# Patient Record
Sex: Male | Born: 1955 | Race: White | Hispanic: No | Marital: Married | Smoking: Never smoker
Health system: Southern US, Community
[De-identification: ages and names within clinical notes are randomized; demographics above are authoritative.]

## PROBLEM LIST (undated history)

## (undated) ENCOUNTER — Emergency Department (HOSPITAL_COMMUNITY): Admission: EM | Payer: Medicare Other | Source: Home / Self Care

## (undated) DIAGNOSIS — E119 Type 2 diabetes mellitus without complications: Secondary | ICD-10-CM

## (undated) DIAGNOSIS — H269 Unspecified cataract: Secondary | ICD-10-CM

## (undated) DIAGNOSIS — Z951 Presence of aortocoronary bypass graft: Secondary | ICD-10-CM

## (undated) DIAGNOSIS — K7460 Unspecified cirrhosis of liver: Secondary | ICD-10-CM

## (undated) DIAGNOSIS — I10 Essential (primary) hypertension: Secondary | ICD-10-CM

## (undated) DIAGNOSIS — G4733 Obstructive sleep apnea (adult) (pediatric): Secondary | ICD-10-CM

## (undated) DIAGNOSIS — M5126 Other intervertebral disc displacement, lumbar region: Secondary | ICD-10-CM

## (undated) DIAGNOSIS — F0390 Unspecified dementia without behavioral disturbance: Secondary | ICD-10-CM

## (undated) DIAGNOSIS — Z8739 Personal history of other diseases of the musculoskeletal system and connective tissue: Secondary | ICD-10-CM

## (undated) DIAGNOSIS — D696 Thrombocytopenia, unspecified: Secondary | ICD-10-CM

## (undated) DIAGNOSIS — Z8719 Personal history of other diseases of the digestive system: Secondary | ICD-10-CM

## (undated) DIAGNOSIS — G43909 Migraine, unspecified, not intractable, without status migrainosus: Secondary | ICD-10-CM

## (undated) DIAGNOSIS — M1990 Unspecified osteoarthritis, unspecified site: Secondary | ICD-10-CM

## (undated) DIAGNOSIS — H9192 Unspecified hearing loss, left ear: Secondary | ICD-10-CM

## (undated) DIAGNOSIS — G8929 Other chronic pain: Secondary | ICD-10-CM

## (undated) DIAGNOSIS — G459 Transient cerebral ischemic attack, unspecified: Secondary | ICD-10-CM

## (undated) DIAGNOSIS — M545 Low back pain, unspecified: Secondary | ICD-10-CM

## (undated) DIAGNOSIS — I252 Old myocardial infarction: Secondary | ICD-10-CM

## (undated) DIAGNOSIS — K760 Fatty (change of) liver, not elsewhere classified: Secondary | ICD-10-CM

## (undated) DIAGNOSIS — F419 Anxiety disorder, unspecified: Secondary | ICD-10-CM

## (undated) DIAGNOSIS — E7800 Pure hypercholesterolemia, unspecified: Secondary | ICD-10-CM

## (undated) DIAGNOSIS — I639 Cerebral infarction, unspecified: Secondary | ICD-10-CM

## (undated) DIAGNOSIS — K219 Gastro-esophageal reflux disease without esophagitis: Secondary | ICD-10-CM

## (undated) DIAGNOSIS — I2510 Atherosclerotic heart disease of native coronary artery without angina pectoris: Secondary | ICD-10-CM

## (undated) DIAGNOSIS — I351 Nonrheumatic aortic (valve) insufficiency: Secondary | ICD-10-CM

## (undated) DIAGNOSIS — Z9989 Dependence on other enabling machines and devices: Secondary | ICD-10-CM

## (undated) HISTORY — PX: CARDIAC CATHETERIZATION: SHX172

## (undated) HISTORY — PX: ESOPHAGOGASTRODUODENOSCOPY: SHX1529

## (undated) HISTORY — DX: Unspecified cirrhosis of liver: K74.60

## (undated) HISTORY — DX: Type 2 diabetes mellitus without complications: E11.9

## (undated) HISTORY — PX: COLONOSCOPY: SHX174

## (undated) HISTORY — DX: Atherosclerotic heart disease of native coronary artery without angina pectoris: I25.10

## (undated) HISTORY — DX: Obstructive sleep apnea (adult) (pediatric): G47.33

## (undated) HISTORY — PX: NEUROPLASTY / TRANSPOSITION ULNAR NERVE AT ELBOW: SUR895

## (undated) HISTORY — DX: Essential (primary) hypertension: I10

## (undated) HISTORY — PX: SHOULDER ARTHROSCOPY: SHX128

## (undated) HISTORY — PX: CORONARY ANGIOPLASTY WITH STENT PLACEMENT: SHX49

## (undated) HISTORY — PX: CORONARY ANGIOPLASTY: SHX604

## (undated) SURGERY — ESOPHAGOGASTRODUODENOSCOPY (EGD) WITH PROPOFOL
Anesthesia: Monitor Anesthesia Care

## (undated) MED FILL — Iron Sucrose Inj 20 MG/ML (Fe Equiv): INTRAVENOUS | Qty: 15 | Status: AC

---
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
## 1996-11-09 HISTORY — PX: ANTERIOR CERVICAL DECOMP/DISCECTOMY FUSION: SHX1161

## 1998-03-13 ENCOUNTER — Ambulatory Visit (HOSPITAL_COMMUNITY): Admission: RE | Admit: 1998-03-13 | Discharge: 1998-03-13 | Payer: Self-pay | Admitting: Internal Medicine

## 1998-04-18 ENCOUNTER — Ambulatory Visit (HOSPITAL_COMMUNITY): Admission: RE | Admit: 1998-04-18 | Discharge: 1998-04-18 | Payer: Self-pay | Admitting: Internal Medicine

## 1999-09-03 ENCOUNTER — Encounter: Payer: Self-pay | Admitting: Family Medicine

## 1999-09-03 ENCOUNTER — Ambulatory Visit (HOSPITAL_COMMUNITY): Admission: RE | Admit: 1999-09-03 | Discharge: 1999-09-03 | Payer: Self-pay | Admitting: Family Medicine

## 1999-09-15 ENCOUNTER — Encounter: Admission: RE | Admit: 1999-09-15 | Discharge: 1999-09-18 | Payer: Self-pay | Admitting: Family Medicine

## 2000-09-14 ENCOUNTER — Encounter: Admission: RE | Admit: 2000-09-14 | Discharge: 2000-12-13 | Payer: Self-pay | Admitting: Family Medicine

## 2000-10-01 ENCOUNTER — Ambulatory Visit (HOSPITAL_COMMUNITY): Admission: RE | Admit: 2000-10-01 | Discharge: 2000-10-01 | Payer: Self-pay | Admitting: Family Medicine

## 2000-10-01 ENCOUNTER — Encounter: Payer: Self-pay | Admitting: Family Medicine

## 2000-10-05 ENCOUNTER — Ambulatory Visit (HOSPITAL_COMMUNITY): Admission: RE | Admit: 2000-10-05 | Discharge: 2000-10-05 | Payer: Self-pay | Admitting: Cardiovascular Disease

## 2000-10-07 ENCOUNTER — Emergency Department (HOSPITAL_COMMUNITY): Admission: EM | Admit: 2000-10-07 | Discharge: 2000-10-07 | Payer: Self-pay | Admitting: Emergency Medicine

## 2000-11-18 ENCOUNTER — Ambulatory Visit (HOSPITAL_COMMUNITY): Admission: RE | Admit: 2000-11-18 | Discharge: 2000-11-18 | Payer: Self-pay | Admitting: Gastroenterology

## 2000-11-18 ENCOUNTER — Encounter: Payer: Self-pay | Admitting: Gastroenterology

## 2003-01-06 ENCOUNTER — Inpatient Hospital Stay (HOSPITAL_COMMUNITY): Admission: EM | Admit: 2003-01-06 | Discharge: 2003-01-09 | Payer: Self-pay | Admitting: Cardiology

## 2003-01-17 ENCOUNTER — Inpatient Hospital Stay (HOSPITAL_COMMUNITY): Admission: EM | Admit: 2003-01-17 | Discharge: 2003-01-19 | Payer: Self-pay | Admitting: Cardiovascular Disease

## 2003-01-28 ENCOUNTER — Inpatient Hospital Stay (HOSPITAL_COMMUNITY): Admission: EM | Admit: 2003-01-28 | Discharge: 2003-01-29 | Payer: Self-pay | Admitting: Emergency Medicine

## 2003-01-29 ENCOUNTER — Encounter: Payer: Self-pay | Admitting: Cardiology

## 2003-02-10 ENCOUNTER — Inpatient Hospital Stay (HOSPITAL_COMMUNITY): Admission: AD | Admit: 2003-02-10 | Discharge: 2003-02-14 | Payer: Self-pay | Admitting: Internal Medicine

## 2003-02-14 ENCOUNTER — Encounter: Payer: Self-pay | Admitting: Internal Medicine

## 2003-11-10 HISTORY — PX: CARPAL TUNNEL RELEASE: SHX101

## 2003-12-20 ENCOUNTER — Inpatient Hospital Stay (HOSPITAL_COMMUNITY): Admission: EM | Admit: 2003-12-20 | Discharge: 2003-12-22 | Payer: Self-pay | Admitting: Internal Medicine

## 2003-12-24 ENCOUNTER — Inpatient Hospital Stay (HOSPITAL_COMMUNITY): Admission: EM | Admit: 2003-12-24 | Discharge: 2003-12-26 | Payer: Self-pay | Admitting: Cardiology

## 2004-01-03 ENCOUNTER — Inpatient Hospital Stay (HOSPITAL_COMMUNITY): Admission: AD | Admit: 2004-01-03 | Discharge: 2004-01-05 | Payer: Self-pay | Admitting: *Deleted

## 2004-11-11 ENCOUNTER — Ambulatory Visit: Payer: Self-pay | Admitting: Internal Medicine

## 2004-11-11 ENCOUNTER — Inpatient Hospital Stay (HOSPITAL_COMMUNITY): Admission: EM | Admit: 2004-11-11 | Discharge: 2004-11-13 | Payer: Self-pay | Admitting: Emergency Medicine

## 2004-11-28 ENCOUNTER — Ambulatory Visit: Payer: Self-pay | Admitting: Cardiology

## 2006-01-12 ENCOUNTER — Inpatient Hospital Stay (HOSPITAL_COMMUNITY): Admission: AD | Admit: 2006-01-12 | Discharge: 2006-01-14 | Payer: Self-pay | Admitting: Cardiology

## 2006-01-12 ENCOUNTER — Ambulatory Visit: Payer: Self-pay | Admitting: Cardiology

## 2006-03-04 ENCOUNTER — Ambulatory Visit: Payer: Self-pay | Admitting: Cardiology

## 2006-05-23 ENCOUNTER — Ambulatory Visit: Payer: Self-pay | Admitting: Cardiology

## 2006-05-23 ENCOUNTER — Inpatient Hospital Stay (HOSPITAL_COMMUNITY): Admission: EM | Admit: 2006-05-23 | Discharge: 2006-05-24 | Payer: Self-pay | Admitting: Emergency Medicine

## 2006-08-23 ENCOUNTER — Encounter: Admission: RE | Admit: 2006-08-23 | Discharge: 2006-11-21 | Payer: Self-pay | Admitting: Orthopedic Surgery

## 2006-11-15 ENCOUNTER — Ambulatory Visit: Payer: Self-pay | Admitting: Cardiology

## 2006-11-19 ENCOUNTER — Ambulatory Visit: Payer: Self-pay | Admitting: Cardiology

## 2006-11-19 ENCOUNTER — Inpatient Hospital Stay (HOSPITAL_BASED_OUTPATIENT_CLINIC_OR_DEPARTMENT_OTHER): Admission: RE | Admit: 2006-11-19 | Discharge: 2006-11-19 | Payer: Self-pay | Admitting: Cardiology

## 2006-12-31 ENCOUNTER — Ambulatory Visit: Payer: Self-pay | Admitting: Cardiology

## 2007-01-05 ENCOUNTER — Ambulatory Visit: Payer: Self-pay | Admitting: Cardiology

## 2007-09-05 ENCOUNTER — Ambulatory Visit: Payer: Self-pay | Admitting: Cardiology

## 2008-06-12 ENCOUNTER — Ambulatory Visit: Payer: Self-pay | Admitting: Cardiology

## 2008-06-13 ENCOUNTER — Inpatient Hospital Stay (HOSPITAL_COMMUNITY): Admission: AD | Admit: 2008-06-13 | Discharge: 2008-06-15 | Payer: Self-pay | Admitting: Cardiology

## 2008-06-13 ENCOUNTER — Ambulatory Visit: Payer: Self-pay | Admitting: Cardiology

## 2008-06-25 ENCOUNTER — Ambulatory Visit: Payer: Self-pay | Admitting: Cardiology

## 2008-07-04 ENCOUNTER — Ambulatory Visit: Payer: Self-pay | Admitting: Cardiology

## 2009-06-04 ENCOUNTER — Ambulatory Visit: Payer: Self-pay | Admitting: Cardiology

## 2009-07-30 DIAGNOSIS — E782 Mixed hyperlipidemia: Secondary | ICD-10-CM | POA: Insufficient documentation

## 2009-07-30 DIAGNOSIS — I10 Essential (primary) hypertension: Secondary | ICD-10-CM | POA: Insufficient documentation

## 2009-10-16 ENCOUNTER — Ambulatory Visit: Payer: Self-pay | Admitting: Cardiology

## 2011-03-24 NOTE — Discharge Summary (Signed)
NAME:  [name] [name], [name] [name] NO.:  [id number]   MEDICAL RECORD NO.:  [id number]          PATIENT TYPE:  INP   LOCATION:  4705                         FACILITY:  [facility]   PHYSICIAN:  Fay Records, MD, FACCDATE OF BIRTH:  05-Jun-1956   DATE OF ADMISSION:  06/13/2008  DATE OF DISCHARGE:  06/15/2008                               DISCHARGE SUMMARY   PRIMARY CARDIOLOGIST:  Ernestine Mcmurray, MD,FACC   PRIMARY CARE PHYSICIAN:  Duke Salvia, MD   PROCEDURES PERFORMED DURING HOSPITALIZATION:  1. Cardiac catheterization completed by Dr. Bing Quarry.      a.     Normal left ventricle, stent to left anterior descending       with mild irregularities 70% septal, 60-70% obtuse marginal       subbranch, less than 50% posterior descending artery with slow       filling, medical treatment recommended.   FINAL DISCHARGE DIAGNOSES:  1. Coronary artery disease.      a.     Status post Cypher stenting to the mid left anterior       descending and cutting balloon percutaneous transluminal coronary       angioplasty first diagonal artery in February 2005, patent left       anterior descending stent site with residual nonobstructive       coronary artery disease with ejection fraction of 65%, and a       normal wall motion abnormality by cardiac catheterization in       January 2008.      b.     Status post cardiac catheterization in July 2009, per Dr.       Bing Quarry, revealing an left anterior descending stent with       mild irregularity, 70% septal, 67% obtuse marginal subbranch, less       than 50% posterior descending artery with slow filling, medical       treatment recommended.      c.     Status post cutting balloon percutaneous transluminal       coronary angioplasty of the first obtuse marginal in March 2007.      d.     Status post Cypher stenting to the proximal left anterior       descending in March 2004.          I. Status post stenting to the proximal left  anterior              descending x2 in 2002.  2. Insulin-dependent diabetes.  3. Hypertension.  4. Hyperlipidemia.  5. Gastroesophageal reflux disease.      a.     History of gastritis.  6. Asthma.      a.     History of reactive airway disease.  7. History of depression.  8. History of gastroparesis.  9. Peripheral neuropathy.  10.Chronic back pain.   HOSPITAL COURSE:  This is a 55 year old Caucasian male well-known to Dr.  Dannielle Burn with a long-standing history of coronary artery disease who  presented to [facility] with complaints of chest pain.  He was  ruled out for myocardial infarction while at [facility] and was seen by  [facility] for further evaluation in the setting of known CAD  and recurrent chest pain.  The patient's cardiac enzymes were found to  be negative and the patient, because of ongoing pain in known coronary  artery disease, was felt to be best served to have a repeat cardiac  catheterization.  The patient was transferred to [facility] for  cardiac catheterization on June 13, 2008.  The patient did have  recurrent discomfort in his chest and was started on nitroglycerin and  p.o. Imdur.  Cardiac catheterization was completed the following day on  June 14, 2008, with results discussed above.  Please see Dr. Maren Beach  thorough cardiac catheterization note for more details.   The patient was restarted on Neurontin t.i.d. for chronic pain during  hospitalization.  The patient had no arrhythmias noted during  hospitalization.   The patient did have recurrence of chest discomfort on June 14, 2008,  post catheterization with repeat cardiac catheterization feeling with no  changes.  The patient had chest pain relief with use of morphine.   The day of discharge, the patient was seen and examined by Dr. Dorris Carnes.  The patient had no further complaints of chest pain.  The patient  had addition of Norvasc 1.25 mg daily to medication regimen.   All other  medications will continue prior to admission.  The patient will have a  followup appointment with Dr. Dannielle Burn at the [facility] office as an  outpatient.  A new prescription for the Norvasc was provided.  He will  continue his other medications and follow up with primary care  physician.  The patient did ambulate in the hall without further  complaints of chest discomfort and was found to be stable for discharge.   DISCHARGE LABS:  Hemoglobin 14.8, hematocrit 42.7, white blood cells  5.1, and platelets 136.  Sodium 142, potassium 3.9, chloride 105, CO2  29, BUN 10, creatinine 1.14, glucose 123, total cholesterol 161,  triglycerides 238, HDL 25, and LDL 88.   VITAL SIGNS:  Blood pressure 123/60, heart rate 70, and respirations 18.   DISCHARGE MEDICATIONS:  1. NovoLog for sliding scale.  2. Actos 45 mg daily.  3. Toprol-XL 100 mg daily.  4. Aspirin 325 mg daily.  5. Neurontin 300 mg t.i.d.  6. Protonix 40 mg twice a day.  7. Lipitor 40 mg at bedtime.  8. Advair 100/50 b.i.d.  9. Remeron 30 mg daily.  10.Januvia 100 mg daily.  11.Lantus insulin 50 units subcutaneously at bedtime.  12.Altace 5 mg daily.  13.TriCor 145 mg daily.  14.Norvasc 2.5 mg 1/2 tablet daily (new prescription).   ALLERGIES:  No known drug allergies.   FOLLOW UP PLANS AND APPOINTMENT:  1. The patient is to follow with Dr. Dannielle Burn or physician assistant on      June 25, 2008, at 3:15 p.m.  2. The patient is going to follow up with his primary care physician,      Dr. Feliciana Rossetti as needed.  3. The patient has been given post cardiac catheterization      instructions with particular emphasis on the right groin site for      evidence of bleeding hematoma or signs of infection.   Time spent with the patient to include physician time 35 minutes.      Phill Myron. Purcell Nails, NP      Fay Records, MD,  [facility]  Electronically Signed    KML/MEDQ  D:  06/15/2008  T:  06/16/2008  Job:  [id number]   cc:   Duke Salvia, M.D.

## 2011-03-24 NOTE — Assessment & Plan Note (Signed)
[place] OFFICE NOTE   [name] [name], [name] [name]                      MRN:          [id number]  DATE:06/25/2008                            DOB:          02/25/1956    PRIMARY CARDIOLOGIST:  Ernestine Mcmurray, MD, [facility]   REASON FOR VISIT:  Post-hospital followup.   [name] [name] returns to our clinic following a recent referral to [place] here  at [facility] for evaluation of chest pain.  He presented with  longstanding history of known CAD, status post multiple prior  percutaneous interventions, and ruled out for MI with all serial cardiac  markers within normal limits.  His symptoms, however, were worrisome for  possible CAD progression, and he was thus transferred directly for  cardiac catheterization.  He had also had a relatively recent negative  stress test last October.   Cardiac catheterization, performed by Dr. Bing Quarry, showed  continued patency of the Cypher stent in the mid LAD.  Left ventricular  function was normal.  There were scattered irregularities in the septal  perforator, as well as a small marginal subbranch.  Dr. Lia Foyer  recommended continued medical therapy with consideration for physiologic  testing, if the patient continued to have ongoing ischemia.   Medications were adjusted with addition of Norvasc 1.25 mg, reportedly  for treatment of the residual disease.   [name] [name] has not had any recurrent angina pectoris.  He is tolerating  his medications.  He denies any complications of the right groin  incision site.  He has inquired as to whether or not he can use Viagra,  as needed.   CURRENT MEDICATIONS:  1. Norvasc 1.25 mg daily.  2. Aspirin 325 daily.  3. Toprol-XL 100 daily.  4. NovoLog sliding scale.  5. Neurontin 300 t.i.d.  6. Lipitor 40 daily.  7. Advair 100/50 b.i.d.  8. Lantus 50 units at bedtime.  9. Altace 5 daily.  10.TriCor 145 daily.  11.Prilosec.  12.Metformin 500 b.i.d.   PHYSICAL EXAMINATION:  VITAL SIGNS:  Blood pressure 153/90, pulse 98 and  regular, and weight 246.6.  GENERAL:  A 55 year old male, obese, sitting upright, in no distress.  HEENT:  Normocephalic, atraumatic.  NECK:  Palpable carotid pulse without bruits.  LUNGS:  Clear to auscultation in all fields.  HEART:  RRR (S1S2).  No significant murmurs.  No rubs.  ABDOMEN:  Protuberant, nontender.  EXTREMITIES:  Right groin stable.  No ecchymosis, hematoma, or bruit and  auscultation; tactile femoral distal pulse.  NEURO:  No focal deficit.   IMPRESSION:  1. Multivessel coronary artery disease.      a.     Stable anatomy with patent LAD Cypher stents by recent       cardiac catheterization.      b.     Residual 70% septal perforator; 70% small marginal       subbranch.      c.     Normal LVF.      d.     Status post multiple prior percutaneous interventions.      e.  Negative adequate exercise test Cardiolite; EF 61%, October       2008.  2. Hypertension, currently uncontrolled.  3. Insulin-dependent diabetes mellitus  4. Dyslipidemia.  5. Gastroesophageal reflux disease/history of gastritis.  6. Asthma.  7. Chronic back pain.  8. Morbid obesity.  9. Dyslipidemia.   PLAN:  1. Increase Norvasc to 2.5 mg daily, for more aggressive blood      pressure control.  In the future, we may consider either      discontinuing this altogether in favor of optimizing Altace to 10      mg daily.  2. Down titrate aspirin to 81 mg daily.  3. Increase Lipitor to 80 mg daily, for more aggressive lipid      management.  A recent lipid profile reveals LDL 88, HDL 25,      triglycerides 238, and total cholesterol 261.  The patient was      recently placed on TriCor, a  few months ago, for better management      of his mixed dyslipidemia.  We will try to attain an LDL target of      70 or less, if possible.  4. The patient is advised to use Viagra with caution.  He is to  not      use any nitrates within a 24-hour time frame.  5. Surveillance FLP/LFT in 12 weeks.  6. Schedule return clinic follow up with myself and Dr. Dannielle Burn in 6      months, or sooner as needed.      [name], PA-C  Electronically Signed      Denice Bors. Stanford Breed, MD, [facility]  Electronically Signed   GS/MedQ  DD: 06/25/2008  DT: 06/26/2008  Job #: [id number]   cc:   Wiliam Ke

## 2011-03-24 NOTE — Cardiovascular Report (Signed)
[name] [name], [name] [name]               ACCOUNT NO.:  [id number]   MEDICAL RECORD NO.:  [id number]          PATIENT TYPE:  INP   LOCATION:  4705                         FACILITY:  [facility]   PHYSICIAN:  Loretha Brasil. Lia Foyer, MD, FACCDATE OF BIRTH:  [birth year]   DATE OF PROCEDURE:  06/14/2008  DATE OF DISCHARGE:                            CARDIAC CATHETERIZATION   INDICATIONS:  Mr. Sprong has been studied multiple times.  He presents  with exertional chest discomfort.  His wife describes a history of  fairly typical symptoms.  Current study was done to reassess the  anatomy.  He has had prior Cypher stenting of the LAD.   PROCEDURES:  1. Left heart catheterization.  2. Selective coronary angiography.  3. Selective left ventriculography.   DESCRIPTION FOR PROCEDURE:  The procedure was performed from the right  femoral artery using 5-French catheters.  He tolerated the procedure  without complications and was taken to the holding area in satisfactory  clinical condition.   HEMODYNAMIC DATA:  1. Central aortic pressure was 121/81, mean 99.  2. Left ventricular pressure 120/12.  3. No gradient pullback across the aortic valve.   ANGIOGRAPHIC DATA:  1. Ventriculography in the RAO projection reveals well-preserved      global systolic function.  2. The right coronary artery is a very large-caliber vessel.  It fills      the posterior descending and posterolateral branch.  The PDA itself      has multiple small sub-branches.  There is a little bit of sluggish      flow, but we took multiple extra shots to lay out the ostium, and      there does not appear to be significant ostial compromise.  It      would be graded at less than 30%.  The posterolateral branch has      mild luminal irregularity with about 30% narrowing in the      midvessel.  3. The left main is free of critical disease.  4. The LAD courses to the apex.  There is a stented vessel with      overlapping Cypher stents.  There is  about 70% narrowing of septal      perforator.  There is diffuse 30% narrowing within the stent itself      and about 50% ostial stenosis involving the diagonal, which appears      to occur between the stented portions.  Distally, there may be 30-      40% narrowing.  Critical disease is not noted.  5. The circumflex provides a tiny ramus intermedius.  There is a large      first marginal with segmental 40% narrowing overlying a sub-branch      takeoff.  The sub-branch itself has 60-70% ostial narrowing, but is      relatively small in caliber.  The remainder of the AV circumflex is      without critical disease.   CONCLUSION:  1. Preserved left ventricular function.  2. Continued patency of the Cypher stents in the mid LAD.  3. Scattered irregularities including a 70%  septal perforator, 60-70%      small marginal sub-branch, and other findings as noted above.   RECOMMENDATIONS:  Likely, we would recommend continued medical therapy.  He may benefit from physiologic testing to assess ongoing ischemia and  response to medical therapy.  At the present time, I would not recommend  stenting or anything obvious.      Loretha Brasil. Lia Foyer, MD, [facility]  Electronically Signed     TDS/MEDQ  D:  06/14/2008  T:  06/15/2008  Job:  [id number]   cc:   Mannie Stabile, PA-C  Ernestine Mcmurray, MD,FACC

## 2011-03-27 NOTE — Discharge Summary (Signed)
NAME:  [name] [name], [name] [name]                         ACCOUNT NO.:  [id number]   MEDICAL RECORD NO.:  [id number]                   PATIENT TYPE:  INP   LOCATION:  0093                                 FACILITY:  [facility]   PHYSICIAN:  Ernestine Mcmurray, M.D.                 DATE OF BIRTH:  [birth year]   DATE OF ADMISSION:  01/03/2004  DATE OF DISCHARGE:  01/05/2004                           DISCHARGE SUMMARY - REFERRING   PROCEDURE:  Coronary angiography/stenting (Cypher) left anterior descending  on January 04, 2004.   REASON FOR ADMISSION:  Please refer to dictated admission note.   LABORATORY DATA:  Hemoglobin 13.5, hematocrit 38, platelets 182 at  discharge. Sodium 135, potassium 3.6, glucose 242, BUN 12, creatinine 1.2 at  discharge. CPK 52/0.9.   HOSPITAL COURSE:  Following initial presentation at [facility]  [place], where the patient presented with chest pain in the setting of  known coronary artery disease and had negative serial cardiac enzymes and  EKG's, the patient was stabilized and transferred to [facility] for  further diagnostic evaluation.  Recommendation was to proceed with relook  coronary angiography for definitive exclusion of CAD progression.  Of note,  he had recent hospitalization and coronary angiography on February 10  revealing noncritical coronary artery disease with a post-cath Adenosine  Cardiolite negative for ischemia.   The patient proceeded with cardiac catheterization, performed by Dr. Lyndel Safe  (see report for full details), revealed 70% mid LAD stenosis at the  bifurcation of the first diagonal.  Residual anatomy revealed nonobstructive  coronary artery disease.  Left ventricular function was normal.   Dr. Lyndel Safe proceeded with successful placement of a Cypher stent with  reduction of the mid LAD lesion to 0% residual stenosis, with no noted  complications.   The patient was kept for overnight observation, and cleared for discharge  the following morning in hemodynamically stable condition.  He was reporting  no chest pain with ambulation and physical examination revealed no  complications of the right groin incision site.   MEDICATION ADJUSTMENTS:  1. Continuation of Plavix for at least six months.  2. The patient will resume Glucophage in 48 hours.  3. He is to otherwise resume all previous home medications.   DISCHARGE INSTRUCTIONS:  No heavy lifting/driving for two days; maintain a  low fat/cholesterol and diabetic diet; call the office if there is any  swelling/bleeding of the groin.   The patient will follow up with [name], M.D. [facility] at our [facility] in two weeks.  Arrangements will be made through our office.   DISCHARGE DIAGNOSES:  1. Recurrent chest pain with known coronary artery disease.     A. Status post stent (Cypher) 70% mid left anterior descending at first        diagonal bifurcation on February 25.     B. Nonobstructive residual coronary artery disease.     C.  Normal left ventricular function.  2. Diabetes mellitus - insulin requiring.  3. Hypertension.  4. Dyslipidemia.  5. Gastroesophageal reflux disease.  6. History of anxiety/depression.      [name], P.A. [facility]                      Ernestine Mcmurray, M.D.    GS/MEDQ  D:  01/05/2004  T:  01/05/2004  Job:  [id number]   cc:   [place], [place], [place] [id number]   Dr. Loyal Gambler, [place]

## 2011-03-27 NOTE — Cardiovascular Report (Signed)
NAME:  [name] [name], [name] [name]                         ACCOUNT NO.:  [id number]   MEDICAL RECORD NO.:  [id number]                   PATIENT TYPE:  INP   LOCATION:  7672                                 FACILITY:  [facility]   PHYSICIAN:  Christy Sartorius, M.D.                   DATE OF BIRTH:  [birth year]   DATE OF PROCEDURE:  01/04/2004  DATE OF DISCHARGE:  01/05/2004                              CARDIAC CATHETERIZATION   PROCEDURE:  1. Left heart catheterization.  2. Left ventriculogram.  3. Selective coronary angiography.  4. Percutaneous transluminal coronary angioplasty and stent placement, mid     left anterior descending artery.  5. Percutaneous transluminal coronary angioplasty of the first diagonal     branch of the left anterior descending artery.   DIAGNOSES:  1. Coronary atherosclerotic disease.  2. Normal left ventricular systolic function.  3. Unstable angina.   HISTORY:  [name] [name] is a 55 year old gentleman with a known history of  coronary disease who has previously undergone percutaneous intervention with  stent placement to proximal LAD on January 08, 2003.  He has had some residual  chest discomfort and repeat angiography has shown moderate narrowing of the  diagonal branch, at least 70%, with moderate disease of the mid-LAD of 60-  70% as well.  Stress imaging study was negative and the patient was  continued on medical therapy.  However, recently he has had recurrence of  symptoms that have become unstable.   The patient was admitted to the hospital and presents now for further  cardiac assessment and intervention of the mid-LAD.   TECHNIQUE:  Informed consent was obtained.  The patient was taken to the  catheterization lab.  A 7 French was placed in the right femoral artery  using the modified Seldinger technique.  Next, a pigtail catheter was  advanced to the left ventricle and a left ventriculogram performed using  power image as contrast.  A JR-4 catheter diagnostic was  then introduced and  used to engage the right coronary artery. Again, selective angiography was  performed.  Subsequently, a 7-French guide catheter was used to engage the  left coronary artery and selective angiography was performed in various  projections using manual injection contrast.   INITIAL FINDINGS:  1. Left main trunk:  Medium caliber vessel with mild irregularities.  2. Left anterior descending artery:  A medium caliber vessel that provides a     large diagonal branch in the mid-section. The LAD has previously placed     stent in the proximal segment that is widely patent with a mild taper in     the mid-section of 20%.  There is then moderate plaque of 60-70%     involving the bifurcation with the first diagonal branch. The diagonal     branch was at least 70-80% narrowed at its origin.  The distal LAD has     mild diffuse  disease of 30%.  3. Left circumflex artery:  This is a medium caliber vessel that provides to     the marginal branch in the mid-section and a trivial third marginal     branch distally.  The AV circumflex and first marginal branch have mild     disease at 30%.  4. Right coronary artery:  A dominant to large caliber vessel that provides     posterior descending artery and posterior ventricular branch. The right     coronary has mild narrowing of 30% in the proximal segment and there is     then further disease of 30% in the proximal segment of the posterior     ventricular branch.  5. Left ventricle:  Normal LV systolic and end-diastolic dimensions.     Overall left ventricular function is well preserved.  Ejection fraction     greater than 55%.  No mitral regurgitation.  LV pressure 100/10.  Aortic     = 100/70.  LVEDP = 15.   With these findings, we elected to proceed with percutaneous intervention of  the mid-LAD. The patient had been pretreated with aspirin and Plavix. He was  given Angiomax on a weighted, adjusted basis.   A 0.14 support wire was  advanced to the distal LAD and a second extra  support wire advanced to the first diagonal branch. A 2.5 x 10 mm cutting  balloon was then introduced and used to pre-dilate the ostium of the first  diagonal branch.  Three placements were performed at 6 atmospheres for 30  seconds.  Repeat injection excellent result with no residual stenosis and no  vessel damage in this diagonal branch.   A 2.5 x 23 mm Cypher drug-eluding stent was then introduced, carefully  positioned in the mid-LAD at the distal edge of the previously placed stent  and covering the diagonal branch. This was deployed at 10 atmospheres for 30  seconds.  The wire was retracted during stent deployment and then was  repositioned in the first diagonal branch. A 2.75 x 15 mm Quantum pneumatic  balloon was then used to post-dilate the stent. Two inflations were  performed at 14 atmospheres for 30 seconds.   Repeat angiography was then performed after the administration of  intracoronary nitroglycerin showing excellent result with no residual  stenosis in the mid-LAD or in the diagonal branch with TIMI-III flow and no  vessel damage. The guide catheter was then removed and the sheath secured in  position. The patient tolerated the procedure well and was transferred to  the floor in stable condition.   FINAL RESULT:  Successful PTCA and stent placement in the mid-LAD with a  reduction of 70% narrowing to 0% with placement of a 2.5 x 23 mm Cypher drug-  eluting stent, dilated to 2.75 mm with adjuvant cutting balloon angioplasty  of the first diagonal branch with reduction of 70% down to 0%.   ASSESSMENT/PLAN:  [name] [name] is a 55 year old gentleman with moderate  coronary artery disease.  He has undergone percutaneous intervention of the  most critical lesions.  At this point, aggressive medical therapy will be  pursued.                                              Christy Sartorius, M.D.    [name] [name]  D:  01/04/2004  T:  01/06/2004  Job:  [id number]   cc:   [name], M.D. [facility]   Loyal Gambler, M.D.

## 2011-03-27 NOTE — Cardiovascular Report (Signed)
[place]  Patient:    [name] [name], [name] [name]                      MRN: [id number] Adm. Date:  [id number] Disc. Date: [id number] Attending:  Harriette Bouillon CC:         C. Brigitte Pulse, M.D.             Carlena Bjornstad, M.D. [facility]             Bruce R. Olevia Perches, M.D. [facility]             Terald Sleeper, M.D.             CV Laboratory                        Cardiac Catheterization  INDICATIONS:  Mr. Komar is a 55 year old who has had some chest discomfort. The patient underwent a cardiac catheterization which revealed two lesions in the LAD.  He was originally set up for a Cardiolite study, but after Dr. Ron Parker, Dr. Dannielle Burn, and Dr. Olevia Perches all reviewed the films and the situation, it was there recommendation that the patient have percutaneous coronary intervention. I was contacted to perform this study.  I went up to the patients room, met the patient and his wife, and reviewed the clinical findings and angiographic findings with the patient.  Risks, benefits, and alternatives were discussed. The patient was brought to the catheterization laboratory for further evaluation.  PROCEDURE:  Percutaneous stenting of the left anterior descending artery.  DESCRIPTION OF PROCEDURE:  The procedure was performed from the left femoral artery through an anterior puncture, a 7 French sheath was placed.  Heparin, Integrilin were given according to protocol and appropriate ACTs were measured.  We initially used a JL4 guide, but exchanged for a JL3.5 guide, so that the tip would be pointed superiorly.  The lesion was crossed with a 0.014 Hi-Torque floppy wire.  The lesion was then crossed with a 23 mm x 3 mm stent overlapping the two separate lesions.  With this, there appeared to be adequate coverage and the stent was then taken up to 14 atmospheres.  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 There was marked improvement in the appearance of the artery.  The patient did experience some chest pain.  Intracoronary nitroglycerin was administered as was intravenous Nubane.  Subsequently the symptoms resolved.  The femoral sheath was then sewn into place and the catheters removed.  He was taken to the postangioplasty care unit in satisfactory clinical condition.  HEMODYNAMIC DATA:  The central aortic pressure was 144/73.  ANGIOGRAPHIC DATA:  The left anterior descending artery demonstrates an approximate 70% stenosis proximally followed by a focal 80% stenosis.  There are several noncritical lesions involving the distal vessel.  There is a normal area over which the septal perforator and diagonal are located. Following stenting with a single stent, both areas were reduced to less than 10% luminal narrowing.  There was a nice stepup in the proximal vessel and there was no evidence of edge tear.  The final angiographic result was felt to be satisfactory.  CONCLUSION:  Successful percutaneous stenting of tandem lesions in the proximal left anterior descending artery.  DISPOSITION:  The patient will be followed by Dr. Ron Parker and Dr. Brigitte Pulse. DD:  02/25/01 TD:  02/28/01 Job: [id number]

## 2011-03-27 NOTE — Discharge Summary (Signed)
[name], [name] NO.:  [id number]   MEDICAL RECORD NO.:  [id number]          PATIENT TYPE:  INP   LOCATION:  7425                         [facility]   PHYSICIAN:  Dola Argyle, M.D.     DATE OF BIRTH:  [birth year]   DATE OF ADMISSION:  01/12/2006  DATE OF DISCHARGE:  01/14/2006                                 DISCHARGE SUMMARY   PROCEDURES:  1.  Cardiac catheterization.  2.  Coronary arteriogram.  3.  Left ventriculogram.  4.  Cutting balloon angioplasty of 1 vessel.   DISCHARGE DIAGNOSIS:  Unstable anginal pain, status post cutting balloon  angioplasty of the obtuse marginal branch, this admission.   SECONDARY DIAGNOSES:  1.  Status post CYPHER stent x2 to the left anterior descending in 2004.  2.  Diabetes mellitus.  3.  Gastroesophageal reflux disease.  4.  Chronic back pain.  5.  Hypertension.  6.  Duodenitis/gastritis.  7.  Dyslipidemia.  8.  Depression.  9.  Obesity.  10. Preserved left ventricular function with an ejection fraction of 60% at      catheterization this admission.   HOSPITAL COURSE:  [name] [name] is a 55 year old male with known coronary  artery disease.  He had stents to the LAD in 2003 and 2004.  He went to  [facility] on January 12, 2006 for substernal chest pressure and  shortness of breath.  He was evaluated there by Dr. Dannielle Burn and transferred  to [facility] for further evaluation and treatment.   Cardiac catheterization on January 13, 2006 showed less than 20% in-stent  restenosis in the LAD stents.  The RCA had 20% stenosis and there as a  branch of the circumflex that had an 85% lesion.  Dr. Albertine Patricia evaluated the  films along with Dr. Domenic Polite and felt that cutting balloon angioplasty of  the OM branch was indicated and this was performed on January 13, 2006.   On January 14, 2006, [name] [name]'s post-procedure labs were within normal  limits.  His CBG was elevated at 285, but his diabetes medications had been  held.  He is to restart his diabetes medications and ambulate.  If he has no  chest pain with ambulation, he is considered stable for discharge with  outpatient followup arranged.   DISCHARGE LABORATORY VALUES:  Hemoglobin 14.7, hematocrit 41.5, WBC 4.5,  platelets 164,000.  Sodium 136, potassium 3.9, chloride 102, CO2 26, BUN 9,  creatinine 1.0, glucose 285.  CK-MB 54/1.2.   DISCHARGE INSTRUCTIONS:  1.  His activity level is to be increased gradually.  2.  He is to call our office with problems with the cath site.  3.  He is to follow up with Dr. Domenic Polite on February 26, 2006 at 12:45 and      sooner on a p.r.n. basis.  4.  He is to follow up with Dr. Feliciana Rossetti as needed.   DISCHARGE MEDICATIONS:  1.  NovoLog sliding-scale insulin twice daily.  2.  Lantus 40 units nightly.  3.  Actos 30 mg a day.  4.  Lopid 600 mg  twice daily.  5.  Toprol-XL 100 mg a day.  6.  Aspirin 325 mg a day.  7.  Neurontin 300 mg 3 times daily.  8.  Protonix 40 mg twice daily.  9.  Lipitor 20 mg nightly.  10. Altace 2.5 mg a day.  11. Advair metered-dose inhaler.  12. Sublingual nitroglycerin p.r.n.      Rosaria Ferries, P.A. [facility]    ______________________________  Dola Argyle, M.D.    RB/MEDQ  D:  01/14/2006  T:  01/15/2006  Job:  [id number]   cc:   Ledell Noss, [facility]   Duke Salvia, M.D.  Fax: [phone]

## 2011-03-27 NOTE — Discharge Summary (Signed)
NAME:  [name] [name], [name] [name]                         ACCOUNT NO.:  [id number]   MEDICAL RECORD NO.:  [id number]                   PATIENT TYPE:  INP   LOCATION:  2033                                 FACILITY:  [facility]   PHYSICIAN:  Christy Sartorius, M.D.                   DATE OF BIRTH:  Oct 20, 1956   DATE OF ADMISSION:  12/20/2003  DATE OF DISCHARGE:  12/21/2003                           DISCHARGE SUMMARY - REFERRING   HISTORY OF PRESENT ILLNESS:  [name] [name] is a 55 year old male who presented  to [facility] with progressive chest discomfort over the preceding several  weeks.  He presented to the emergency room.  He was transferred to the [facility] with continuing chest discomfort which responded to beta blockers  and nitrates.  The prior to admission around lunch time, he developed a  relatively severe episode of chest discomfort associated with high blood  pressure when he checked this.  He also felt hot and flushed and related it  to similar discomfort in the past with his stent to the LAD.  His wife felt  that he should probably be evaluated and took him to the emergency room.  He  also has associated shortness of breath, diaphoresis, nausea.   PAST MEDICAL HISTORY:  1. Coronary artery disease with multiple catheterizations, the last of which     was in February 2004.  At that time, he had EF of 50%, 90% mid LAD, 50 to     75% first diagonal, 30% proximal RCA.  He received a stent to the LAD in     February 2004.  A relook in March was free of restenoses.  2. History of hyperlipidemia.  3. History of mildly elevated LFTs.  4. Obesity.  5. Diabetes.  6. Hypertension.  7. Chronic low back discomfort.  8. History of gastritis.  9. History of depression.   LABORATORY DATA:  At [facility], CK-MBs and troponins were negative  for myocardial infarction.  PT was 13.4, PTT 25.  Sodium 132, potassium 4.2,  BUN 17, creatinine 1.1, glucose 286.  Hemoglobin 15.1, hematocrit 45.6,  normal  indices, platelets 187, WBC 3.8.   EKG showed baseline artifact, normal sinus rhythm, no acute changes.   HOSPITAL COURSE:  [name] [name] was transferred to [facility] for  further evaluation.  He was seen by Mikey Bussing and Dr. Wilhemina Cash for  admission.  He was placed on IV heparin and Integrillin.  Cardiac  catheterization was performed on December 21, 2003, by Dr. Lyndel Safe.  According  to Dr. Steve Rattler note, the patient has a 50% ejection fraction with distal  anterior and apical hypokinesis.  He had mild disease in the left main.  The  stent to the mid LAD was patent.  He had a 60% distal LAD, 70% diagonal.  He  had a 40% OM-2 and a 30% RCA.  Dr. Lyndel Safe felt that he had  moderate coronary  artery disease of the LAD.  He felt the patient could undergo an outpatient  Cardiolite.  If this were positive, consideration should be given to  stenting to the distal LAD.  Otherwise if negative, continue medical  treatment.   On sheath removal and bed rest, the patient was ambulating without  difficulty, and catheterization site was intact.  Dr. Lyndel Safe felt the patient  could be discharged to home.   DISCHARGE DIAGNOSES:  Chest discomfort of undetermined etiology, slight  progression of coronary artery disease.  History as previously described.   DISPOSITION:  He is discharged home.   DISCHARGE MEDICATIONS:  1. He was asked to resume his Glucophage 1000 mg b.i.d. on Sunday.  2. Insulin 70/30, 18 units in the morning, 16 units in the evening.  3. Actos 30 mg daily.  4. Plavix 75 mg daily.  5. Altace 2.5 mg daily.  6. Toprol XL 100 daily.  7. Depakote 250 t.i.d.  8. Lexapro 200 daily.  9. Wellbutrin 150 b.i.d.  10.      Nexium 40 b.i.d.  11.      Aspirin 81 mg daily.  12.      Advair 100/50 b.i.d.  13.      Trazodone 50 daily.  14.      Lopid 600 b.i.d.  15.      Nitroglycerin 0.4 as needed.   DISCHARGE INSTRUCTIONS:  1. He was advised no lifting, driving, sexual activity, or heavy  exertion     for two days.  2. Maintain low-salt, low-fat, low-cholesterol diet.  3. If any problems with catheterization site, he was asked to call     immediately.  4. He will have an outpatient Cardiolite at [facility] on February     22 , 2005, at 8 a.m. and is instructed nothing to eat or drink post     midnight prior to his test.  No caffeine in 24 hours preceding.  5. He will have a followup appointment with Dr. Domenic Polite on January 08, 2004,     at 2:45 p.m.      Sharyl Nimrod, P.A. [facility]                    Christy Sartorius, M.D.    EW/MEDQ  D:  12/21/2003  T:  12/21/2003  Job:  [id number]   cc:   [name], M.D. [facility]   Joan Flores, M.D.  [address], [place]. [place]  [place] [address]  Fax: [phone]

## 2011-03-27 NOTE — Discharge Summary (Signed)
NAME:  [name] [name], [name] [name]                         ACCOUNT NO.:  [id number]   MEDICAL RECORD NO.:  [id number]                   PATIENT TYPE:  INP   LOCATION:  2018                                 FACILITY:  [facility]   PHYSICIAN:  Sueanne Margarita, P.A.              DATE OF BIRTH:  Feb 20, 1956   DATE OF ADMISSION:  12/24/2003  DATE OF DISCHARGE:  12/26/2003                                 DISCHARGE SUMMARY   DISCHARGE DIAGNOSES:  1. Admitted with chest heaviness accompanied by sharp pains which were more     intermittent, but heaviness would persist.  Radiation to left shoulder.     Also a feeling of bruising in the right groin.  2. Troponin I studies negative times three.  Rule out myocardial infarction.  3. Adenosine Cardiolite study December 25, 2003.  No defects seen.  Ejection     fraction 50%.  4. Flushing.  The patient will have 24 hour metanephrine study taken as     outpatient.  5. Need for statin therapy.  Liver function tests this admission.  Alkaline     phosphatase 41, SGOT 53, SGPT 48 only very mildly elevated.   SECONDARY DIAGNOSES:  1. History of coronary artery disease.     a. Catheterization 2002 with stenting to tandem proximal LAD lesions.     b. Catheterization March 2004 stent to the proximal LAD.     c. Catheterization December 20, 2003.  Study showed that the flow in the        diagonal was improved, but the distal LAD was slightly worse with        medical treatment.  2. Catheterization study February 2005.  Left main had mild irregularities.     The LAD had a stent mid point which was widely patent.  The LAD itself     had a 70% stenosis distal to the diagonal.  The diagonal itself had a     proximal 70% stenosis.  The left circumflex had 40% stenosis in the     second obtuse marginal.  The right coronary artery had a 30% proximal and     30% distal stenosis.  The PDA had 30 to 40% proximal stenoses.  3. Type 2 diabetes, insulin-dependent diagnosed four years  ago.  4. Dyslipidemia.  5. Mildly elevated LFTs as related above.  6. Obesity.  7. Hypertension.  8. Gastroesophageal reflux disease (GERD) gastritis.  9. Depression.  10.      Status post C3-C4 fusion.  11.      Herniated lumbar disk with chronic back pain.  4.      Family history of coronary artery disease.   DISCHARGE DISPOSITION:  The patient has been chest pain free since his  admission on February 14th.  He had been placed on IV heparin.  He has  undergone adenosine Cardiolite study which showed no defects and the  ejection fraction of 50%.  He did complain of some chest pain, but the most  recent catheterization films have been reviewed and adenosine Cardiolite  shows no evidence of ongoing ischemia.  This bifold study argues strongly  that chest pain is not secondary to myocardial ischemia.  The pain has been  improved with proton pump inhibitor.  Working diagnosis currently is  gastritis.  The patient complains of flushing as well and will go home with  a urine receptacle for a 24 hour metanephrine to be kept on ice and to be  submitted to [facility] Cardiology office at [facility] after completion.  The  patient also goes home with the following medications:   1. Glucophage 1,000 mg twice daily.  2. Insulin 70/30, 18 units in the morning, 16 units in the evening.  3. Enteric coated aspirin 81 mg daily.  4. Plavix 75 mg daily.  5. Actos 30 mg daily.  6. Altace 2.5 mg daily.  7. Toprol XL 100 mg daily.  8. Lexapro 20 mg daily.  9. Wellbutrin 150 mg twice daily.  10.      Depakote 250 mg three times daily.  11.      Trazodone 50 mg daily.  12.      Lopid 600 mg twice daily.  13.      Advair 100/50 twice daily.  14.      Nitroglycerin 0.4 mg one tablet under the tongue every five minutes     times three as needed for chest pain.  15.      Protonix 40 mg twice daily.   DISCHARGE ACTIVITIES:  As tolerated.   DISCHARGE DIET:  Low sodium, low cholesterol, ADA diet.   FOLLOWUP:   Follow up as above with [facility] cardiology office in [place], [place] January 10, 2004 at 2:30 in the afternoon.  He will submit 24 hour  urine sample taken over a 24 hour period, kept on ice, to [place] office when completed.   BRIEF HISTORY:  The patient is a 55 year old male.  He has had at least  three prior left heart catheterizations, the last being December 20, 2003.  He was discharged Saturday, February 12th and eight hours later in the  evening began having chest heaviness accompanied by sharp pains to the left  shoulder and also having burning in the right groin.  The sharp pains would  come and go, but the chest heaviness would persist.  These symptoms are  accompanied by nausea, dyspnea and flushing feeling all over.  Sunday in the  morning after a fitful night, he presented to [facility], nitro  paste was applied and the chest pain was relieved.  No enzymes on the  hospital admission.  However, enzymes on admission here were negative times  three.  An electrocardiogram was non-diagnostic.  The patient was  lightheaded with the chest pain.  The patient's plan would be to be admitted  with enzyme cycle, electrocardiogram taken, admission labs obtained as well  as a chest x-ray.   HOSPITAL COURSE:  After admission to [facility] February 14th with  chest heaviness, the patient was ruled out for myocardial infarction.  He  was started on IV heparin and on the 15th an adenosine Cardiolite study was  obtained showing no defects.  Negative for ischemia, ejection fraction of  50%.  The patient has remained chest pain free throughout the  hospitalization and has been started on Protonix 40 mg b.i.d.  Will go home  with  this medication in addition to his previous medications.  Once again,  his liver function tests were only mildly elevated.  The patient is not on a statin at the moment.  I will dictated lipid profile obtained at the end of  this  dictation.  The patient goes home with the medications and follow up as  dictated.  Serum electrolytes this admission February 14th - sodium 136,  potassium 3.8, chloride 100, carbonate 32, glucose 259, BUN 14, creatinine  1.2, alkaline phosphatase 41, SGOT 53, SGPT 48, troponin I studies 0.01  times three.  Lipid profile - cholesterol 173, triglyceride 348, HDL  cholesterol 26, LDL cholesterol 77, complete blood count on admission -  white cells 3.9, hemoglobin 13.9, hematocrit 39.8, platelets 159,000.   Once again, this patient would benefit with statin and follow up with LFTs  after six weeks trial on statin.  This will be addressed in the office of  [facility], [facility].                                                Sueanne Margarita, P.A.    GM/MEDQ  D:  12/26/2003  T:  12/26/2003  Job:  [id number]   cc:   [name], M.D. [facility]   Loyal Gambler, M.D.  [place], [place]

## 2011-03-27 NOTE — H&P (Signed)
NAME:  [name] [name], [name] [name]                         ACCOUNT NO.:  [id number]   MEDICAL RECORD NO.:  [id number]                   PATIENT TYPE:  INP   LOCATION:  2018                                 FACILITY:  [facility]   PHYSICIAN:  Champ Mungo. Lovena Le, M.D.               DATE OF BIRTH:  1956/02/07   DATE OF ADMISSION:  12/24/2003  DATE OF DISCHARGE:                                HISTORY & PHYSICAL   ADMISSION DIAGNOSIS:  Chest pain.   CHIEF COMPLAINT:  Chest pain.   HISTORY OF PRESENT ILLNESS:  The patient is a pleasant 55 year old male with  a history of known coronary artery disease, status post catheterization and  angioplasties in the past.  The patient was recently in the hospital and  underwent catheterization on February 11.  Please see his prior admission  note for additional details of his history prior to this.  The patient was  found to have moderate disease of the LAD with improved flow in the stenotic  area.  His distal LAD was found to be, however, more significantly diseased  with a 70% distal LAD.  He was discharged home.  He did well for a day but  then had recurrent substernal chest pain associated with nausea and  shortness of breath.  He stayed at home for one day but continued to have  symptoms and went to the hospital in [place] where nitroglycerin  relieved his pain.  He is now referred for additional evaluation and  treatment.   PAST MEDICAL HISTORY:  1. Diabetes times four years.  2. He has history of dyslipidemia.  3. He has history of mild LFT abnormalities.  4. He has history of obesity.  5. History of hypertension.  6. History of gastritis and reflux.  7. He has history of depression.  8. History of herniated disk.  9. The patient has mildly depressed left ventricular function.  His initial     catheterization back in 2002 with stenting of his proximal LAD at that     time.  In March 2004, he also underwent repeat stenting to the proximal     LAD.   SOCIAL HISTORY:  The patient denies tobacco or ethanol use.  He lives in  [place], [place].   FAMILY HISTORY:  Notable for coronary disease in his father and hypertension  with his mother.   REVIEW OF SYSTEMS:  As noted from [name] [name] and notable for flushing  sensation associated with his chest pain.  There is no relationship to  respiration and it is not associated with dyspepsia.  He denies syncope,  cough or hemoptysis.  He denies dysuria, hematuria or nocturia.  He denies  weakness, numbness or depression.  He denies arthritis or arthralgia  symptoms.  He denies nausea and vomiting, diarrhea or constipation. He does  have intermittently in the past some reflux symptoms, but not related to  chest pain.  He denies polyuria or polydipsia.  The rest of this review of  systems was negative.  Please see [name] [name]'s note for additional  details.   PHYSICAL EXAMINATION:  GENERAL:  He is a pleasant, middle-aged, obese man in  no distress.  VITAL SIGNS:  Blood pressure 110/80, pulse 72 and regular, respirations 18.  HEENT:  Normocephalic, atraumatic.  Pupils are equal and round.  Oropharynx  moist.  Sclerae anicteric.  NECK:  No jugular venous distension.  There is no thyromegaly.  Trachea  midline.  LUNGS:  Clear to auscultation bilaterally.  No wheezes, rales or rhonchi.  CARDIOVASCULAR:  Regular rate and rhythm with normal S1 and S2.  There was  an S4 gallop.  ABDOMEN:  Soft, nontender, nondistended.  There is no organomegaly.  EXTREMITIES:  Demonstrated no cyanosis, clubbing or edema.  Pulses 2+ and  symmetric.  NEUROLOGIC:  Nonfocal with cranial nerves II-XII grossly intact.  Strength  5/5 and symmetric.   LABORATORY DATA:  His initial EKG is pending.  Prior EKGs demonstrated no  acute changes.   IMPRESSION:  1. Known ischemic heart disease.  2. Recurrent chest pain.  3. Status post recent catheterization with moderate coronary disease but no     clearcut tight  stenosis that would be consistent with rest symptoms.   DISCUSSION:  Will plan to admit the patient to the hospital.  Will obtain  serial cardiac enzymes.  Will consider Cardiolite stress testing to help  guide medical versus percutaneous therapy.                                                Champ Mungo. Lovena Le, M.D.    GWT/MEDQ  D:  12/24/2003  T:  12/24/2003  Job:  [id number]   cc:   [name], M.D. [facility]

## 2011-03-27 NOTE — H&P (Signed)
NAME:  [name] [name], [name] [name]                         ACCOUNT NO.:  [id number]   MEDICAL RECORD NO.:  [id number]                   PATIENT TYPE:  INP   LOCATION:  3731                                 FACILITY:  [facility]   PHYSICIAN:  Christy Sartorius, M.D.                   DATE OF BIRTH:  1956/07/02   DATE OF ADMISSION:  01/03/2004  DATE OF DISCHARGE:                                HISTORY & PHYSICAL   CHIEF COMPLAINT:  Chest pain.   Mr. Ryser is a 55 year old male patient with a known history of coronary  artery disease, status post stent placement to the LAD in 2002 and in March  2004. He had a recent hospitalization and cardiac catheterization on  December 20, 2003, which revealed an LAD with 70% distal, 70% proximal  diagonal, 40% OM-2 lesion, 30% proximal one distal RCA lesion, and a 40% PDA  proximal lesion. Adenosine Cardiolite post cath was negative for defect or  ischemia with an ejection fraction of 50%. The patient was discharged home,  but continued to have substernal chest pain which developed yesterday. He  had substernal chest pain radiating to the left shoulder and relieved with  sublingual nitroglycerin. He denies any shortness of breath, nausea, or  vomiting. He presented to [facility] and was transferred to [facility] for further cardiac workup.   He also complained of recent flushing while in the hospital here and we just  hit him up for a 24-hour metanephrine urine study.   PAST MEDICAL HISTORY:  1. GERD.  2. Hypertension.  3. Depression/anxiety.  4. Chronic back pain.  5. Herniated lumbar disk disease, status post C3-4 fusion.  6. Coronary artery disease as above.  7. Family history of coronary artery disease.  8. Insulin-dependent diabetes mellitus.  9. Mildly elevated LFTs on admission.  10.      Obesity.  11.      Gastritis.   SOCIAL HISTORY:  Lives in [place] with his wife. He is disabled and  married. He is on a diabetic diet, low sodium and  low cholesterol.   FAMILY HISTORY:  Hypertension in his mother who is alive at age 84. Father  is alive, age 24, history of myocardial infarction, followed by [facility]. Brother with MS. Two sisters suffer with asthma.   MEDICATIONS:  1. Glucophage 1000 mg.  2. Zetia.  3. Insulin 70/30, 18 units in the a.m. and 16 units in the evening.  4. Baby aspirin daily.  5. Plavix 75 mg a day.  6. Actos 30 mg a day.  7. Altace 2.5 mg a day.  8. Toprol XL 100 mg a day.  9. Lexapro 20 mg a day.  10.      Wellbutrin 150 mg t.i.d.  11.      Depakote 250 mg t.i.d.  12.      Trazodone 50 mg a day.  13.  Lipid 600 mg b.i.d.  14.      Advair 100/50 b.i.d.  15.      Sublingual nitroglycerin p.r.n.  16.      Protonix 40 mg b.i.d.   ALLERGIES:  No known drug allergies.   PHYSICAL EXAMINATION:  VITAL SIGNS: Temperature 98.7, pulse 67, respirations  20, blood pressure 108/81.  GENERAL: In no acute distress.  HEENT: Grossly normal. No carotid or subclavian bruits. JVD is normal.  CHEST: Clear to auscultation. No rales, rhonchi, or wheezes.  HEART: Regular rate and rhythm. No S1 and S2. No murmurs, rubs, or gallops.  ABDOMEN: Normal bowel sounds.  Nontender, nondistended. No masses or bruits.  EXTREMITIES: No lower extremity edema.  NEUROLOGIC: Cranial nerves II-XII grossly intact.   Chest x-ray reveals no acute disease. Her EKG reveals a normal sinus rhythm,  rate of 66, with nonspecific inferior changes.   LABORATORY DATA:  Cardiac enzymes times three in [place]. BMP was 15.  Coagulation studies were normal. BUN 17, creatinine 1.0, hemoglobin 15,  hematocrit 42.9.   ASSESSMENT:  1. Chest pain.  2. Coronary artery disease.  3. Diabetes mellitus, insulin dependent.  4. Hypertension, treated.  5. Depression.  6. Anxiety.  7. Gastroesophageal reflux disease.  8. Obesity.   The patient is transferred from [facility] for admission. Enzymes are  negative, but the patient  has had substernal chest pain and does have known  disease in the setting of multiple cardiac risk factors. Will continue on a  low dose  of Lovenox tonight and plan cath tomorrow with probable PCI of the  bifurcation of the LAD diagonal disease. The patient was seen and examined  by Dr. Dannielle Burn. Dr. Dannielle Burn did speak with Dr. Lyndel Safe regarding this patient  and will review the films in the morning prior to the procedure.      Joesphine Bare, P.A. [facility]                      Christy Sartorius, M.D.    LB/MEDQ  D:  01/03/2004  T:  01/04/2004  Job:  [id number]   cc:   Christy Sartorius, M.D.   Dr. Dierdre Harness, [place]   Dr. Clayborne Artist

## 2011-03-27 NOTE — Cardiovascular Report (Signed)
NAME:  [name] [name], [name] [name]                         ACCOUNT NO.:  [id number]   MEDICAL RECORD NO.:  [id number]                   PATIENT TYPE:  INP   LOCATION:  3703                                 FACILITY:  [facility]   PHYSICIAN:  Eustace Quail, M.D. [facility]              DATE OF BIRTH:  01-Sep-1956   DATE OF PROCEDURE:  01/08/2003  DATE OF DISCHARGE:                              CARDIAC CATHETERIZATION   PROCEDURE PERFORMED:  Cardiac catheterization and percutaneous coronary  intervention.   CLINICAL HISTORY:  The patient is 55 years old and has type 2 diabetes and  hypertension and was recently admitted to [facility] with chest pain  thought to represent unstable angina.  He was transferred here for further  evaluation and was scheduled for catheterization today.   DESCRIPTION OF PROCEDURE:  The procedure was performed via the right femoral  artery using an arterial sheath and 6 French preformed coronary catheters.  A front wall arterial puncture was performed and Omnipaque contrast was  used. After completion of the diagnostic study we made a decision to proceed  with intervention on the lesion in the left anterior descending artery.   The patient was given Angiomax bolus in infusion and had been on Plavix.  We  used a 6 [place] guiding catheter and a short luge wire.  We crossed the  lesion in the proximal LAD with the wire without difficulty. We pre-dilated  with a 2.5 x 20 mm Quantum Maverick performing two inflations up to 8  atmospheres for 30 seconds.  We then deployed at 2.5 x 18 mm Cypher stent  deploying this with one inflation of 15 atmospheres for 30 seconds.  There  was a mismatch in the proximal reference and distal reference diameter with  the proximal being 3.5 mm and distal being 2.5 mm.  For this reason we used  a 2.5 x 8 mm Quantum Maverick balloon and post-dilated the proximal portion  of the stent with two inflations up to 12 atmospheres for 30 seconds.  Repeat  diagnostic study was then performed through the guiding catheter.  The patient tolerated the procedure well and left the laboratory in  satisfactory condition.   RESULTS:  1. Left ventricular pressure was 106/8 and the aortic pressure was 106/84     with a mean of 101.  2. The left main coronary artery:  The left main coronary artery was free of     significant disease.  3. Left anterior descending:  The left anterior descending artery gave rise     to a large and small septal perforator and a moderate sized diagonal     branch.  There was 90% narrowing in the proximal LAD.  There was 50-70%     narrowing at the ostium of the diagonal branch and there was 30%     narrowing just beyond the diagonal branch.  4. Circumflex artery:  The  circumflex artery gave rise to an atrial branch,     a marginal branch, and a posterolateral branch.  These vessels were free     of significant disease.  5. Right coronary artery:  The right coronary is a large dominant vessel     that gave rise to a conus branch, right ventricular branch, a posterior     descending branch, and a posterolateral branch.  There was 30% narrowing     in the proximal vessel.  The rest of the vessel was free of significant     disease.   LEFT VENTRICULOGRAM:  The left ventriculogram performed in the RAO  projection showed good wall motion with mild global hypokinesis.  The  estimated ejection fraction was 50%.   Following stenting of the lesion in the proximal left anterior descending  artery, the stenosis improved from 90% to less than 10%.  There was no  dissection seen.    CONCLUSIONS:  1. Coronary artery disease with 90% stenosis in the proximal left anterior     descending, 50-70% ostial stenosis of the diagonal branch, 30% narrowing     in the mid left anterior descending, no significant obstruction in the     circumflex artery, 30% narrowing in the proximal right coronary artery     and normal LV function.  2.  Successful stenting of the lesion in the proximal left anterior     descending with improvement in percent diameter narrowing from 90% to     less than 10% using the Cypher stent.   DISPOSITION:  The patient was returned to the postangioplasty unit for  further observation.  Should remain on Plavix for at least 3 months.                                                    Eustace Quail, M.D. [facility]    BB/MEDQ  D:  01/08/2003  T:  01/08/2003  Job:  [id number]   cc:   [address]  [address]  [place] [id number]  Fax: [phone]   [name], M.D. [facility]   Cardiopulmonary Lab

## 2011-03-27 NOTE — Cardiovascular Report (Signed)
NAMECAMP, GOPAL NO.:  [id number]   MEDICAL RECORD NO.:  [id number]          PATIENT TYPE:  INP   LOCATION:  2025                         FACILITY:  [facility]   PHYSICIAN:  Ethelle Lyon, M.D. LHCDATE OF BIRTH:  [birth year]   DATE OF PROCEDURE:  05/24/2006  DATE OF DISCHARGE:                              CARDIAC CATHETERIZATION   PROCEDURE:  Left heart catheterization, left ventriculography, coronary  angiography, Star close closure of the right common femoral arteriotomy  site.   INDICATIONS:  Mr. Lezotte is a 55 year old gentleman with hypertension and  diabetes mellitus with coronary disease for which he is status-post prior  placement of Cypher drug-eluting stent in the LAD and cutting balloon  angioplasty of  a small first obtuse marginal branch at its ostium.  Most  recent procedure was the cutting balloon of the obtuse marginal in March of  this year.  He now presents with several episodes of fleeting chest  discomfort and then an episode of prolonged discomfort on Saturday.  He  ruled out for myocardial infarction by serial enzymes and  electrocardiograms.  Due to his known disease he was referred for repeat  angiography.   PROCEDURAL TECHNIQUE:  Informed consent was obtained.  Underwent  1%  lidocaine local anesthesia, a 5-French sheath was placed in the right common  femoral artery using modified Seldinger technique.  Underwent 1% lidocaine  local anesthesia.  Left heart catheterization and ventriculography were  performed using a pigtail catheter.  Coronary angiography was performed  using JL-4 and JR-4 catheters.   These images showed what in the spider view appeared to be a  severe  restenosis of the prior cutting balloon site in the marginal.  It did not  appear severe  in the RAO caudal.  Nonetheless, I thought it was likely to  be severe.  We therefore proceeded to place equipment for planned repeat  intervention on this marginal.  He was  anticoagulated with Bivalirudin.  I  exchanged for a 6-French sheath and placed a 6-French Voda left 3.5 guide  in. Repeat angiography in multiple views including the spider demonstrated  that the stenosis was no more than 50%.  We therefore decided to  manage him  conservatively.   I closed the arteriotomy using a Star close device.  Complete hemostasis was  obtained.  He was then transferred to holding room in stable condition  having tolerated the procedure well.   COMPLICATIONS:  None.   FINDINGS:  1. LV:  94/5/8.  EF 60% without regional wall motion abnormality.  2. No aortic stenosis or mitral regurgitation.  3. Left main:  Angiographically normal.  4. LAD:  Moderate-sized vessel giving rise to a single fairly large      diagonal.  There is previously placed stent throughout the proximal and      mid vessel.  There is no significant in-stent restenosis.  5. Circumflex:  Moderate-sized vessel giving rise to a fairly small first      and larger second marginal.  There is a 30% stenosis in the AV groove  circumflex headed into the second marginal and crossing the origin of      the first marginal.  This first marginal has  a 50% restenosis at the      previous  cutting balloon angioplasty site.  6. RCA:  Very large dominant vessel.  It is angiographically normal.   IMPRESSION AND PLAN:   PLAN:  The patient has mild restenosis at the site of prior cutting balloon  angioplasty in the first obtuse marginal.  It does not explain his chest  pain.  Will discharge him home later today with plans for evaluation for  noncardiac etiologies of his pain.      Ethelle Lyon, M.D. [facility]  Electronically Signed     WED/MEDQ  D:  05/24/2006  T:  05/24/2006  Job:  [id number]   cc:   Duke Salvia, M.D.  [name], M.D. [facility]

## 2011-03-27 NOTE — Assessment & Plan Note (Signed)
[place]                                 ON-CALL NOTE   EWARD, RUTIGLIANO                      MRN:          [id number]  DATE:11/20/2006                            DOB:          07/06/1956    This is a patient of Dr. Dannielle Burn.  Date of telephone call November 20, 2006 at 12:58 p.m.  I received a telephone call through the answering  service from Ms. Urquilla.  Call states the patient underwent procedure  yesterday.  He is having a headache.  Ms. Kretschmer stated that the patient  had underwent a cardiac catheterization the day before and had been  discharged home.  On the date of the phone call, he was complaining of  diarrhea, nausea and vomiting, and a headache.  When I ask for further  information, apparently, the patient has had a headache since his Imdur  dose was increased a week ago.  The diarrhea, nausea and vomiting is new  on date of telephone call.  The patient denied any chest pain or  shortness of breath.  No fevers or chills.  The wife was concerned that  symptoms may be associated with his cardiac catheterization.  From the  wife's report, the patient did not have any complications from  catheterization, and he was discharged home in stable condition.  There  is no dictated report concerning Mr. Rhames's cardiac catheterization in  the computer system, so I cannot verify that.  I instructed Ms. Blystone  to continue monitoring her husband.  If he had any further increase in  symptoms, he was to go to the emergency room for further evaluation.  I  told her I did not feel his symptoms were related to the cardiac  catheterization.  However, if it did not improve, he needed to seek  medical assistance.  I also instructed her to call Dr. Dannielle Burn on Monday  regarding headache with increased Imdur dose.  I did not decrease the  dose, as I have no official report from the patient's cardiac  catheterization in regards to his coronary angiography.   Ms. Hemmelgarn  verbalized understanding of instructions and states she would followup.      Rosanne Sack, ACNP  Electronically Signed      Minus Breeding, MD, [facility]  Electronically Signed   MB/MedQ  DD: 11/21/2006  DT: 11/21/2006  Job #: [id number]

## 2011-03-27 NOTE — Discharge Summary (Signed)
NAME:  [name] [name], [name] [name] NO.:  [id number]   MEDICAL RECORD NO.:  [id number]                   PATIENT TYPE:  INP   LOCATION:  5400                                 FACILITY:  [facility]   PHYSICIAN:  Era Bumpers, M.D. [facility]          DATE OF BIRTH:  18-Dec-1955   DATE OF ADMISSION:  01/28/2003  DATE OF DISCHARGE:  01/29/2003                                 DISCHARGE SUMMARY   DISCHARGE DIAGNOSES:  1. Cardiac chest pain.  2. Coronary artery disease.     a. Status post stent to the left anterior descending on January 09, 2003,        with a Cypher stent.     b. Relook catheterization on January 18, 2003, with a normal left main.  No        end-stent restenosis of the left anterior descending.  First diagonal        with 50% stenosis.  Right coronary artery with 25% proximal and 25%        distal stenosis.  3. Heme-positive stool with normal hematocrit.  4. History of epistaxis on Integrilin.  5. Insulin-dependent diabetes mellitus.  6. Dyslipidemia.  7. Hypertension.  8. Gastrointestinal reflux disease.  9. Depression.  10.      Chronic back pain.   HOSPITAL COURSE:  Please see the admission history and physical dictated by  Era Bumpers, M.D., for complete details.  Briefly, this 55 year old  male with a history of CAD was transferred to [facility] on January 28, 2003, for evaluation of recurrent chest pain.  On exam at [facility],  he was found to have heme-positive stools, however, his hemoglobin was 15.9  and hematocrit 47.  His enzymes were negative for MI x 3.  EKG was without  any changes.  Therefore, on January 29, 2003, he went in for a treadmill  Cardiolite.  He had no chest pain, shortness of breath, or EKG changes.  Cardiolite images revealed no ischemia and an EF of 51%.  Therefore, it was  felt he was stable for discharge to home.   LABORATORY DATA:  At discharge on January 29, 2003, the white blood cell count  was 4400,  hemoglobin 14.6, hematocrit 42, and platelet count 163,000.  INR  1.1.  Sodium 140, potassium 3.8, chloride 106, CO2 28, glucose 162, BUN 12,  creatinine 1.1, total bilirubin 0.7, alkaline phosphatase 33, AST 30, ALT  36, total protein 6.1, albumin 3.1, calcium 8.7, magnesium 1.8.  Hemoglobin  A1C 9.7.  Total cholesterol 181, triglycerides 277, HDL 30, LDL 96.  TSH  1.725.  CK-MB negative x 3.  Troponin I negative x 2.  Urine drug screen  positive for opiates, otherwise negative.  The chest x-ray at [facility] showed no active disease.   DISCHARGE MEDICATIONS:  1. Plavix 75 mg daily.  2. Toprol XL 25 mg daily.  3. Altace 2.5  mg daily.  4. Glucophage 1 g b.i.d.  5. Amaryl 4 mg daily.  6. Wellbutrin 300 mg daily.  7. Lexapro 20 mg daily.  8. Aspirin 325 mg daily.  9. Insulin 70/30 7 units b.i.d.  10.      Nitroglycerin p.r.n. chest pain.   ACTIVITY:  As tolerated.   DIET:  Low-fat, low-sodium, diabetic diet.   FOLLOW-UP:  He should follow up with Dr. Manuella Ghazi in [place], [place], this  week for blood in stools.  Of note, he was seen by the diabetes coordinator  this admission and they suggested possibly switching him to Lantus Insulin  once daily.  He should discuss this with Dr. Manuella Ghazi.  He has a follow-up  appointment already scheduled with Deborra Medina. Lenna Gilford, M.D., in [place], [place], on February 01, 2003, at 2 p.m.     Richardson Dopp, P.A.                        Era Bumpers, M.D. [facility]    SW/MEDQ  D:  01/29/2003  T:  01/29/2003  Job:  [id number]   cc:   [facility]  [place], [place] [address]   Dr. Lenord Fellers, [place]

## 2011-03-27 NOTE — Discharge Summary (Signed)
NAME:  [name] [name], [name] [name] NO.:  [id number]   MEDICAL RECORD NO.:  [id number]                   PATIENT TYPE:  INP   LOCATION:                                       FACILITY:  [facility]   PHYSICIAN:  Jenkins Rouge, M.D. [facility]              DATE OF BIRTH:  30-Mar-1956   DATE OF ADMISSION:  01/17/2003  DATE OF DISCHARGE:  01/19/2003                           DISCHARGE SUMMARY - REFERRING   PROCEDURES:  1. Cardiac catheterization.  2. Coronary arteriogram.  3. Ultrasound of right groin.   HOSPITAL COURSE:  [name] [name] is a 55 year old male with known coronary  artery disease.  He had PTCA and stent to his LAD in early 3/04.  He was  admitted on 01/16/03 for chest pain that was relieved by sublingual  nitroglycerin and similar to his previous admitting symptoms.  He initially  went to [facility] and his pain was relieved there.  He was  transferred to [facility] for further evaluation and treatment.   [name] [name] was placed on heparin and Integrilin and was pain-free at [facility].  However, he had problems with epistaxis and it was felt that  with no chest pain and negative cardiac enzymes, the Integrilin could be  discontinued.  He was continued on heparin.  He was scheduled for cardiac  catheterization which was performed on 01/18/03 and it showed a normal left  main in LAD with no instant restenosis.  The first diagonal had a 50%  proximal stenosis and the RCA had a 25% proximal and 25% distal lesion.  No  left ventriculogram was performed.  It was felt that it was noncardiac chest  pain and no further cardiac intervention was needed at this time.   [name] [name] was seen by the diabetes coordinator during the course of his  hospital stay.  He had been on Regular insulin b.i.d. and the suggestion was  made to change him to 70/30 insulin, also on a b.i.d. dosage.  [name] [name]  was advised that he needed to stick very tightly to the diabetic  diet and  continue to follow his blood sugars.  It was also felt that he needed  followup with his primary care physician for dose changes and medication  adjustments on his diabetes.   [name] [name] had slight hematoma at his catheterization site initially,  although this resolved with pressure.  He had an ultrasound evaluation of  his groin which showed no pseudoaneurysm or AV fistula.  His groin was  without ecchymoses, hematoma or bruit the next day.   As part of his workup for his chest pain, he had LFT's performed.  They were  within normal limits except for an albumin level that was low at 2.9.  His  white count was within normal limits and he was afebrile.  His abdomen had  active bowel sounds and was without  tenderness or hepatosplenomegaly, so no  further abdominal workup was needed at this time on an acute basis.   By 01/19/03, [name] [name] was symptom-free.  His groin was stable and he had no  chest pain or shortness of breath with ambulation.  He was considered stable  for discharge on 01/19/03 with outpatient followup arranged.   LABORATORY VALUES:  Hemoglobin 14.7, hematocrit 41.7, WBC's 4.1, platelets  185.  Serial CK-MB and troponin are negative for MI.  Sodium 136, potassium  3.4, chloride 100, CO2 28, BUN 11, creatinine 0.9, glucose 233.  Total  cholesterol 167, triglycerides 330, HDL 27, LDL 74.  Hemoglobin A1C on  01/06/03, 10.0.   DISCHARGE CONDITION:  Stable.   DISCHARGE DIAGNOSES:  1. Chest pain, no critical coronary artery disease by catheterization this     admission.  2. Insulin dependent diabetes mellitus, poor control.  3. History of small gallbladder by reported ultrasound (patient has no GI     symptoms at this time).  4. Status post percutaneous transluminal coronary angioplasty and stent to     LAD in 3/04.  5. Hyperlipidemia with hypertriglyceridemia.  6. Hypertension.  7. Family history of coronary artery disease.   DISCHARGE INSTRUCTIONS:  1.  Activity level is to include no driving or strenuous activity for 24     hours.  2. He is to call the office for problems with the catheterization site.  3. He is to followup with [name], M.D. [facility] on 3/25 at 2 p.m.  He     is also to follow up with Dr. Brigitte Pulse within the next two weeks.   DISCHARGE MEDICATIONS:  1. Plavix 75 mg daily.  2. Toprol XL 25 mg daily.  3. Altace 2.5 mg daily.  4. Resume Glucophage 1000 mg b.i.d. on Saturday.  5. Amaryl 4 mg daily.  6. Wellbutrin 300 mg daily.  7. Lexapro 20 mg daily.  8. Nitroglycerin p.r.n.  9. Coated aspirin 345 mg daily.  10.      Humulin 70/30 insulin 12 units b.i.d.        Davis Gourd, P.A. [facility]                  Jenkins Rouge, M.D. [facility]    RG/MEDQ  D:  01/19/2003  T:  01/19/2003  Job:  [id number]   cc:   Arminda Resides, M.D.  [facility] [place], [place]

## 2011-03-27 NOTE — Op Note (Signed)
NAME:  [name] [name], [name] [name] NO.:  [id number]   MEDICAL RECORD NO.:  [id number]                   PATIENT TYPE:  INP   LOCATION:  2001                                 FACILITY:  [facility]   PHYSICIAN:  Scarlett Presto, M.D.                DATE OF BIRTH:  Dec 05, 1955   DATE OF PROCEDURE:  01/18/2003  DATE OF DISCHARGE:                                 OPERATIVE REPORT   REFERRING PHYSICIAN:  Dr. Brigitte Pulse   CARDIOLOGIST:  [name], M.D. and Eustace Quail, M.D.   PROCEDURE:  Cardiac catheterization.   HISTORY OF PRESENT ILLNESS:  This is a 55 year old gentleman with known  coronary disease status post coronary intervention a month ago with a 2518  cipher stent in his proximal left anterior descending coronary artery.  He  had the recurrence of discomfort in his chest and was referred for elective  cardiac catheterization to assess the patency of the stent.   DETAILS OF PROCEDURE:  The patient was brought to the cardiac  catheterization laboratory in the fasting state where he was premedicated  with 1 mg of Versed and 25 mcg of Fentanyl..  The right groin was then  anesthetized using 1% lidocaine without epinephrine and the right femoral  artery was cannulated using the modified Seldinger technique with a 6 French  10-cm sheath.   Left heart catheterization was performed using an 6 French Judkins left #4  and a 6 French Judkins right #4.  At the conclusion of the procedure the  catheter was removed and the patient was moved back to the cardiac procedure  area where the femoral artery and sheath was removed.  Hemostasis was  obtained using direct manual pressure.   At the conclusion of the hold there was no evidence of ecchymosis or  hematoma formation and distal pulses were intact.  Total fluoroscopic time  was 1.8 minutes.  Total iodinized contrast was 70 mL of material.   RESULTS:  Aortic pressure was measured at 106/74 with a mean arterial  pressure  of 90 mmHg.   SELECTIVE CORONARY ANGIOGRAPHY:  1. The left main coronary artery is a normal sized artery which is     angiographically normal.  2. The left anterior descending coronary artery has a stent in the proximal     portion just prior to the first diagonal.  There is evidence of minimal     InStent restenosis which is hemodynamically insignificant.  There is a     50% lesion at the ostium of the first diagonal which is distal to the     stent.  The remainder of the LAD is a moderate caliber vessel and is     angiographically unremarkable.  3. The circumflex coronary artery is a moderate caliber vessel that has no     significant coronary disease and two moderate caliber obtuse marginals.  4. The right coronary artery  is a dominant vessel and is a moderate caliber     vessel.  There is a 25% stenosis in the distal portion prior to the crux.     The posterior descending coronary artery is a moderate-to-large capillary     vessel and there is a large caliber posterior communicating branch with a     large caliber, single posterolateral branch with multiple branching     points distal.  The entire right coronary system is essentially free of     disease except for the proximal portion which has a 25% stenosis.   LEFT VENTRICULOGRAM:  A left ventriculogram was not performed.   ASSESSMENT AND PLAN:  This is a gentleman with single vessel coronary  disease which has been successfully mechanically revascularized.  We  recommend risk factor modification and aggressive medical management.                                               Scarlett Presto, M.D.    JH/MEDQ  D:  01/18/2003  T:  01/18/2003  Job:  [id number]   cc:   Dr. Lowell Guitar, M.D. [facility]   Eustace Quail, M.D. [facility]

## 2011-03-27 NOTE — Assessment & Plan Note (Signed)
[facility] OFFICE NOTE   [name] [name], [name] [name]                      MRN:          [id number]  DATE:01/04/2007                            DOB:          1956/04/23    PRIMARY CARDIOLOGIST:  Dr. Terald Sleeper.   REASON FOR VISIT:  Post hospital followup.   Since last seen in the clinic on January 7 by Dr. Dannielle Burn, at which time  patient was felt to have symptoms worrisome for coronary artery disease  progression, patient was set up for an elective cardiac catheterization  at that time which revealed nonobstructive coronary artery disease and  normal left ventricular function.   Specifically, Dr. Percival Spanish noted a widely patent mid LAD stent with 25%  ostial and 30% mid LAD stenosis; 40% ostial first diagonal stenosis;  normal CFX with 30% ostial stenosis of the small, superior branch; and,  essentially normal RCA and 30% proximal posterolateral branch.   Dr. Percival Spanish concluded that the patient's chest pain was nonanginal in  etiology.   More recently, arrangements were made for patient to have a routine  treadmill test, successfully completed just 4 days ago and reviewed by  Dr. Dannielle Burn noting no electrocardiographic evidence of ischemia, although  patient did complain of chest pain.  He also achieved 86% of PMHR.   The patient reports no complications of the right groin since undergoing  cardiac catheterization.   The patient also states that he was not able to tolerate the Imdur at  the increased dose of 60, secondary to headaches.  This was stopped by  Dr. Dannielle Burn about one week ago, and patient has had complete resolution  of his symptoms.  The patient continues to have chest pain, which is  clearly intermittent and occurs both with and without associated  activity.   CURRENT MEDICATIONS:  1. NovoLog sliding scale.  2. Actos 45 daily.  3. Lopid 600 b.i.d.  4. Toprol XL 100 daily.  5. Aspirin 325  daily.  6. Neurontin 300 t.i.d.  7. Protonix 40 b.i.d.  8. Lipitor 40 nightly.  9. Advair 100/50 b.i.d.  10.Remeron 30 daily.  11.Januvia 100 daily.  12.Lantus 50 units nightly.  13.Altace 5 daily.   PHYSICAL EXAMINATION:  Blood pressure 120/78, pulse 91 and regular,  weight 251.6.  GENERAL:  A 55 year old male sitting upright in no distress.  NECK:  Palpable carotid pulses without bruits.  LUNGS:  Clear to auscultation all fields.  HEART:  Regular rate and rhythm (S1, S2), no significant murmurs.  ABDOMEN:  Soft, nontender, with intact bowel sounds.  EXTREMITIES:  Rig th groin stable with no ecchymosis, hematoma, or bruit  on auscultation; intact femoral and distal pulse without edema.  NEURO:  No focal deficit.   IMPRESSION:  1. Noncardiac chest pain.      a.     Nonobstructive coronary artery disease with widely patent       left anterior descending stent site by cardiac catheterization       January 2008.      b.     Normal left ventricular function.  c.     Status post CYPHER stenting left anterior descending 2004.  2. Gastroesophageal reflux disease.      a.     History of gastritis.  3. Hyperlipidemia.      a.     Followed by Dr. Feliciana Rossetti.  4. Hypertension.  5. Insulin-dependent diabetes mellitus  6. Obesity.   PLAN:  1. Continue current medication regimen.  2. Patient is clear to proceed with a functional capacity evaluation,      as requested by Dr. Garfield Cornea, with no restrictions.  3. Schedule return followup with myself and Dr. Dannielle Burn in 6 months.      [name], PA-C  Electronically Signed      Ernestine Mcmurray, MD,FACC  Electronically Signed   GS/MedQ  DD: 01/04/2007  DT: 01/04/2007  Job #: [id number]   cc:   Duke Salvia, M.D.

## 2011-03-27 NOTE — Discharge Summary (Signed)
[name] [name], [name] [name]                ACCOUNT NO.:  [id number]   MEDICAL RECORD NO.:  [id number]           PATIENT TYPE:   LOCATION:                                 FACILITY:   PHYSICIAN:  Sharyl Nimrod, P.A. [facility] DATE OF BIRTH:  August 06, 1956   DATE OF ADMISSION:  05/23/2006  DATE OF DISCHARGE:                           DISCHARGE SUMMARY - REFERRING   DISCHARGE DIAGNOSIS:  1.  Chest discomfort of uncertain etiology.  2.  Nonobstructive coronary artery disease.  3.  Hyperglycemia with poorly controlled diabetes.  4.  Obesity.  5.  Past medical history as listed below.   PROCEDURES PERFORMED:  Cardiac catheterization 05/24/2006 by Dr. Albertine Patricia.   PRIMARY CARDIOLOGIST:  Dr. Johnny Bridge in [place], [place] and Dr.  Jeanie Cooks in [place], [place]   SUMMARY OF HISTORY:  [name] [name] is a 55 year old white male who presented  with fleeting episodes of chest discomfort that he described as grabbing,  associated with nausea, diaphoresis, and shortness of breath.  These began  approximately at 8:00 a.m. while he was sitting and writing.  He began  taking sublingual nitroglycerin; and his wife drove him to the hospital.  By  the time of his arrival he was pain free.  Since his last discharge in March  [name] [name] states that he has not had much many episodes of chest discomfort.   PAST MEDICAL HISTORY:  1.  Notable for insulin dependent diabetes.  2.  Hypertension.  3.  Hyperlipidemia.  4.  Obesity.  5.  Cutting balloon angioplasty to the OM in March 2007.  6  Cypher stenting to the mid-LAD and cutting balloon angioplasty to the  diagonal on February 2005.  1.  Cypher stenting to the proximal LAD in March 2004.  2.  EF 60% in March 2007.  3.  GERD.  4.  Gastritis.  5.  Depression.  6.  Anxiety.  7.  Chronic back pain.   LABORATORY DATA:  Admission H&H was 15.5 and 43.9, normal indices, platelets  148, WBC 3.9.  PTT 30, PT 14.7.  Sodium 137, potassium 3.8, BUN 14,  creatinine 0.9,  normal LFTs.  Hemoglobin A1c was elevated at 10.8.  CK/MBs  and relative indexes and troponins were negative x3.  EKG showed normal  sinus rhythm, normal axis, early R wave.   HOSPITAL COURSE:  [name] [name] was admitted to [facility] and placed  on IV heparin as well as his home medications.  Dr. Percival Spanish felt, given his  symptoms, he should undergo cardiac catheterization.  This was performed on  05/24/2006 by Dr. Albertine Patricia.  According to Dr. Christy Sartorius note, the stent to the  LAD was patent without any InStent restenosis.  He had a 50% OM-1 and a 30%  mid circumflex.  EF was 60%. Dr. Albertine Patricia closed with [employer] without  difficulty.  He noted there was mild restenosis at the site of the cutting  balloon angioplasty of the OM-1;  however, this did not explain his chest  discomfort.  He felt after bed rest, that the patient could be discharged  home  for further evaluation.   At approximately 4:55 on the evening of the 16th, I was called by nursing  staff stating that the patient was complaining of chest discomfort.  He was  describing fleeting episodes of a pleuritic sharp discomfort in his left  chest that lasted just a second or two.  He stated that it did not resemble  his chest discomfort that he presented with, nor prior heaviness that he has  had in his chest associated with his stenting.  On review he has at least  four different types of different chest discomfort.  Dr. Albertine Patricia was  informed, and the patient was reassured that he was probably more in tune to  his chest discomfort given his past medical history; and that Dr. Albertine Patricia  felt that he would be okay for discharge.  I explained to the patient that  he needed to walk intermittently for 1 to 1-1/2 hours; and then he would be  discharged around 7 p.m.; the patient was fine with this.  The wife came to  the desk and wanted to know why he was not walking at 3:00 p.m. per post  cath instructions.  I explained that I could not  answer that; and that  information was not available from the nursing staff.  She was very upset  and stated that she had children to pick up in [place], I informed charge  nurses' of patient and wife's dissatisfaction.  I was called back into the  room by the patient who was very angry and upset; and insisted that he be  discharged at 6 p.m.  This was discussed with Dr. Albertine Patricia; and he gave  clearance for the patient to be discharged at 6 o'clock as long as he was  ambulating and in his catheterization site was intact.   DISPOSITION:  The patient is discharged home and asked to maintain a low  salt, fat, cholesterol ADA diet.  His activities and wound care per the  supplemental discharge sheet.  He was given permission to return to work on  Thursday 05/27/2006.  He was asked to bring all medications to all  appointments.   THESE MEDICATIONS INCLUDE:  1.  Lantus 40 units daily.  2.  Lopid 600 mg b.i.d.  3.  Toprol XL 100 mg daily.  4.  Aspirin 325 daily.  5.  Neurontin 300 mg t.i.d.  6.  Protonix 40 mg b.i.d.  7.  Lipitor 20 mg q.h.s.  8.  Altace 2.5 mg daily.  9.  Imdur 30 mg daily.  10. Remeron 30 mg daily.  11. Advair twice a day unknown dosage.  12. NovoLog sliding scale.  13. Nitroglycerin as needed.   He was asked to call Dr. Feliciana Rossetti for a 1-2 weeks' follow-up appointment in  regards to continuing atypical chest discomfort and better control of his  diabetes.  He will follow up with Dr. Domenic Polite as scheduled in the [place]  office.   DISCHARGE TIME:  Greater than 30 minutes.      Sharyl Nimrod, P.A. [facility]     EW/MEDQ  D:  05/24/2006  T:  05/24/2006  Job:  [id number]

## 2011-03-27 NOTE — H&P (Signed)
NAME:  [name] [name], [name] [name]                         ACCOUNT NO.:  [id number]   MEDICAL RECORD NO.:  [id number]                   PATIENT TYPE:  INP   LOCATION:  2094                                 FACILITY:  [facility]   PHYSICIAN:  Dola Argyle, M.D. [facility]              DATE OF BIRTH:  07-16-1956   DATE OF ADMISSION:  01/28/2003  DATE OF DISCHARGE:                                HISTORY & PHYSICAL   CARDIOLOGIST:  Jenkins Rouge, M.D.   PRIMARY CARE PHYSICIAN:  Dr. Manuella Ghazi in [place], [place].   CHIEF COMPLAINT:  Chest pain since this morning.   HISTORY OF PRESENT ILLNESS:  The patient is a pleasant 55 year old man with  recent chest pain, who states that at approximately 9 a.m. this morning he  experienced left-sided chest tightness and pressure which radiated to his  left arm.  He took a nitroglycerin at home that relieved the chest pain, and  again later in the emergency room he received another nitroglycerin that  further helped his chest pain. He reports headaches with nitroglycerin.  The  patient states that his chest pain this morning lasted for one to two  minutes, and occurred frequently over an hour or two.  This was associated  with shortness of breath, but he had no nausea, vomiting, diaphoresis,  palpitations, etc.  He further notes a recent right groin tenderness at the  site of his previous catheterization, a frequent diarrhea which he  attributes to his medicines.  He has no recent abdominal pain.  At the  [facility] Emergency Room, the patient received two 81 mg aspirin  tablets, an inch of nitroglycerin, and 12.5 mg of Anzemet x1.   PAST MEDICAL HISTORY:  1. The patient has a history of coronary artery disease with a stent to the     LAD in March 2004.  The patient's first cardiac catheterization was January 09, 2003, where he had a stent to a 90% mid-LAD lesion with a 2.5 mm x     18.0 mm Cypher stent.  The patient had recurrent angina, and was taken     back to  the catheterization laboratory on January 18, 2003, where he was     found to have a normal left main and no restenosis of the LAD stent, a     50% first diagonal, and two RCA lesions that were graded at 25% in the     proximal and distal vessel.  The patient has a normal ejection fraction.  2. Epistaxis, on Integrilin.  3. Diabetes mellitus, poorly-controlled.  4. Hyperlipidemia, with hypertriglyceridemia with a low HDL.  5. Hypertension.  6. A positive family history.  7. Chronic back pain, secondary to disk disease.  8. Gastroesophageal reflux disease.  9. Depression.   SOCIAL HISTORY:  He lives in [place] with his wife.  He is currently applying  for disability.  He  has no tobacco history.   FAMILY HISTORY:  His father has coronary artery disease.  His uncle also has  heart disease.   REVIEW OF SYSTEMS:  Fairly positive for dizziness and vertigo after  coughing, a recent headache.  His walking is limited by his back pain.  All  other review of systems are negative, except as delineated in the HPI, with  regard to constitutional symptoms, HEENT, SKIN, CARDIOPULMONARY,  GENITOURINARY, NEURO/PSYCH, MUSCULOSKELETAL, GI, ENDOCRINE, ETC.   PHYSICAL EXAMINATION:  VITAL SIGNS:  Temperature 98.5 degrees, pulse 82,  blood pressure 104/54, SAO2 is 94% on room air.  HEENT:  Normocephalic, atraumatic.  PERRLA.  EOMI.  Moist mucous membranes  and the oropharynx and oral cavity are clear.  NECK:  Supple, no bruit, no jugular venous distention.  No lymphadenopathy,  with 2+ carotid pulses.  CARDIOVASCULAR:  A regular rate and rhythm.  No murmurs, rubs, or gallops.  PMI is normally placed.  LUNGS:  Clear to auscultation bilaterally with no wheezes, rales, or  rhonchi.  SKIN:  Shows no rashes.  ABDOMEN:  Soft.  He does have mild to moderate left lower quadrant  tenderness, but no hepatosplenomegaly.  Normal bowel sounds.  GENITOURINARY:  Not significant.  EXTREMITIES:  No clubbing, cyanosis, or  edema.  No bruits at his previous  catheterization site, and there is no significant hematoma.  Distal pulses  are within normal limits.  NEUROLOGIC:  Examination is intact.  RECTAL:  The patient has a heme-positive stool.   LABORATORY DATA:  Chest x-ray:  No acute process.  Electrocardiogram shows a rate of 77, normal sinus rhythm, axis is normal.  Intervals are normal.  There are no Q-waves or ST-T wave significant  changes.  White count 4, hemoglobin 15.9, hematocrit 47, platelets 98.  Sodium 136,  potassium 4.4, chloride 105, bicarbonate 27, BUN 14, creatinine 1.1, glucose  340.  CK 150, MB 2, troponin I 0.01.  PTT 25, PT 13.  INR 1.1.   ASSESSMENT/PLAN:  1. The patient is a very pleasant 55 year old man who presents with     recurrent chest pain after having had a Cypher stent placed to the mid-     left anterior descending coronary artery in early March 2004.  After the     stent was placed, he had a repeat cardiac catheterization showing no     significant changes with the stent, and stable coronary artery disease.     The patient's risk factors include sex, diabetes mellitus, hypertension,     and hypertriglyceridemia, hyperlipidemia, and a positive family history.     Rule out myocardial infarction:  The patient will be followed on     telemetry.  We will check serial enzymes and keep the patient n.p.o.     after 9 p.m. for a possible exercise or adenosine Cardiolite in the     morning.  I am hesitant to send him straight to catheterization, and the     patient is hesitant as well, given his negative recent cardiac     catheterization.  He states that he has been taking his Plavix as well as     his other medicine; however, his diabetes mellitus has been poorly     controlled.  In case we do need to proceed to a cardiac catheterization,     I will hold his Glucophage.  I will decrease his aspirin dose to 81 mg    daily.  I will continue his beta blocker  and ACE inhibitor at  the present     doses.  I will add Zocor 40 mg q.h.s., and repeat a set of liver enzymes.  2. Heme-positive stool:  I will check serial hematocrits and we will need to     get gastroenterology involved with his care, for possible upper and lower     endoscopy.  The patient is hemodynamically stable now, and a workup for     this is not urgent.  3. Diabetes mellitus:  I will check a hemoglobin A1c and continue the     patient on insulin and Amaryl.  We will hold his Glucophage for now.  He     will also be covered with sliding scale insulin.  4.     Hyperlipidemia:  Plan as above.  5. Back pain:  He will have pain medications ordered p.r.n.  6. Depression:  Continue the current medicines.         Era Bumpers, M.D. [facility]                Dola Argyle, M.D. [facility]    DWM/MEDQ  D:  01/28/2003  T:  01/29/2003  Job:  [id number]

## 2011-03-27 NOTE — Cardiovascular Report (Signed)
NAME:  [name] [name], [name] [name]                         ACCOUNT NO.:  [id number]   MEDICAL RECORD NO.:  [id number]                   PATIENT TYPE:  INP   LOCATION:  2903                                 FACILITY:  [facility]   PHYSICIAN:  Christy Sartorius, M.D.                   DATE OF BIRTH:  1955-12-06   DATE OF PROCEDURE:  12/21/2003  DATE OF DISCHARGE:                              CARDIAC CATHETERIZATION   PROCEDURES PERFORMED:  1. Left heart catheterization.  2. Left ventriculogram.  3. Selective coronary angiography.   DIAGNOSES:  1. Coronary atherosclerotic disease.  2. Low-normal left ventricular systolic function.   CARDIOLOGIST:  Christy Sartorius, M.D.   HISTORY:  Mr. Baize is a 55 year old white male with VC, diabetes mellitus  and coronary disease who presented with substernal chest discomfort.  The  patient has undergone percutaneous intervention of the mid LAD in March 2004  with relook angiography in that month showing patency of the stent.  He has  residual disease in the distal LAD and diagonal branch.  Due to progression  of symptoms heart rate was admitted to the hospital, stabilized medically,  ruled out for acute myocardial infarction and presents for further  assessment.   TECHNIQUE:  Informed consent was obtained.  The patient was brought to the  catheterization lab.  A 6 French sheath was placed in the right femoral  artery using the modified Seldinger technique.  Six [place] JL-4 and JR-4  catheters were then used to engage the left and right coronary arteries.  Selective coronary angiography was performed in various projections using  manual injection of contrast.  A 6 French pigtail catheter was advanced into  the left ventricle a left ventriculogram performed using power injection of  contrast.   At the termination of the case catheters and sheaths were removed.  Manual  pressure was applied until adequate hemostasis was achieved.   The patient tolerated the procedure  well and was transferred to the floor in  stable condition.   FINDINGS:  The findings are as follows.  1. Left Main Trunk:  The left main trunk is a large caliber vessel with mild     irregularities.   1. Left Anterior Descending:  The LAD is a medium caliber vessel that     provides a diagonal branch in the midsection and extends to the apex.     The LAD has a previously placed stent in the midsection between the first     septal perforator and the large diagonal branch that is widely patent.     There is then further disease of 60 and perhaps 70% distal to the     diagonal branch with mild disease of 30% in the apical segment.  The     diagonal branch has an ostial pinch of 70%.   1. Left Circumflex Artery:  Th left circumflex artery is a large caliber  vessel that provides a trivial first marginal branch in the proximal     segment, a bifurcating second marginal branch in the mid section and a     small third marginal branch distally.  The second marginal branch has     moderate narrowing of 40% involving the bifurcation.   1. Right Coronary Artery:  The right coronary artery is dominant.  This is a     large caliber vessel that provides the posterior descending artery and     the posterior ventricular branch in its terminal segment.  The right     coronary artery has mild disease of 30% in the proximal and distal     segments.  The posterior descending branch has moderate narrowing of 30-     40% in the proximal segment.   1. Left Ventricle:  Normal end systolic and end diastolic dimensions.     Overall left ventricular function is low-normal.  Ejection fraction     approximately 50%.  There appears to be hypokinesis of the distal,     anterior and apical walls.  No mitral regurgitation is noted.  LV     pressure is 110/10.  The aortic is 110/80.  LVEDP equals 20.   ASSESSMENT AND PLAN:  Mr. Earnhart is a 55 year old gentleman with moderate  coronary artery disease of the left  anterior descending.  Review of prior  angiograms shows the diagonal to actually have improved in flow and  stenosis.  The distal left anterior descending, however, does appear  slightly worse, although noncritical.   We agree that aggressive medical therapy should be pursued and consideration  given towards additional Plavix as medical regimen.  Stress imaging study  would be prudent to determine if he has any ischemia in the anterior and  anterolateral walls.  Should he have recurrence of symptoms  or demonstrable  ischemia percutaneous intervention may be considered to this bifurcation  lesion.                                               Christy Sartorius, M.D.    [name]  D:  12/21/2003  T:  12/22/2003  Job:  [id number]   cc:   [name], M.D. [place], [place]  [address]  [place] [address]  Fax: [phone]

## 2011-03-27 NOTE — Assessment & Plan Note (Signed)
[facility] OFFICE NOTE   [name] [name], [name] [name]                      MRN:          [id number]  DATE:11/15/2006                            DOB:          03-20-1956    HISTORY OF PRESENT ILLNESS:  The patient is a 55 year old male with  multiple cardiac risk factors including hypertension, dyslipidemia, and  diabetes mellitus.  The patient has known coronary artery disease.  He  is status post PCI and stent to the LAD 12/2000 and status post Cypher  stent in 02/ 2005.  In addition the patient also had a cutting balloon  angioplasty done to an obtuse marginal 01/12/2006.  He has normal LV  function.  The patient now presents with recurrent substernal chest  pain.  The patient states that dating back to December he has had  several episodes of substernal chest pressure which he experiences a  tightness in his chest associated with shortness of breath, sweatiness,  and clamminess.  He states the symptoms are similar to his prior  presentation.  Particularly, he had a particularly bad episode during a  birthday party about a month ago, followed by another episode the next  day.  He occasionally has associated palpitations.  He did not try  taking nitroglycerin due to the fact that it gives him a bad headache.  He has now been referred for further evaluation of his chest pain  syndrome.   MEDICATIONS:  1. NovoLog sliding scale b.i.d.  2. Lantus 40 units q.h.s.  3. Actos 45 daily.  4. Lopid 600 mg p.o. b.i.d.  5. Toprol XL 100 mg p.o. daily.  6. Aspirin 325 daily.  7. Neurontin 300 mg p.o. t.i.d.  8. Protonix 40 mg p.o. b.i.d.  9. Lipitor 40 mg p.o. q.h.s.  10.Altace 2.5 daily.  11.Advair 100/50 b.i.d.  12.Imdur 30 mg p.o. daily.  13.Remeron 30 mg p.o. daily.  14.Januvia 100 mg p.o. daily.  15.Celebrex 20 mg two tablets p.o. daily.   PHYSICAL EXAMINATION:  VITAL SIGNS:  Blood pressure 160/94, heart rate  88  beats per minute, weight is 249 pounds.  NECK:  Normal carotid upstroke, no carotid bruits.  LUNGS:  Clear breath sounds bilaterally.  HEART:  Regular rate and rhythm, normal S1, S2, no murmurs, rubs, or  gallops.  ABDOMEN:  Soft.  EXTREMITIES:  No peripheral edema and no cyanosis.  NEURO:  The patient is alert and oriented, grossly nonfocal.   PROBLEMS:  1. Recurrent substernal chest pain.      a.     Rule out angina.  2. Coronary artery disease with cutting balloon angioplasty to an      obtuse marginal 01/12/2006.      a.     Status post stent to the left anterior descending in       12/2000.      b.     Status post Cypher stent in 12/2003.  3. Dyslipidemia.  4. Diabetes mellitus and peripheral neuropathy.  5. History of gastritis with gastroparesis and reflux disease.  6. Hypertension.  7. Obesity.  8.  Asthma with history of reactive airway disease.  9. History of depression.   PLAN:  1. The patient clearly has symptoms consistent with angina.  He has      been afraid to take nitro due to the onset of headaches.  I have      asked him to increase his Imdur from 30 to 19m POQD  2. I have explained to the patient that we need to proceed with a      cardiac catheterization as I suspect that he likely has recurrent      significant disease.  He has been scheduled for JV catheterization      later this week.  3. The patient remains significantly hypertensive.  In light of his      diabetes mellitus I have increased his Altace from 2.5 to 5 mg p.o.      daily.  4. The appropriate laboratory work will be done today and although I      do not have a creatinine available today I anticipate the patient's      creatinine is within normal limits.     GErnestine Mcmurray MD,FACC  Electronically Signed    GED/MedQ  DD: 11/15/2006  DT: 11/15/2006  Job #: [id number]  cc:   JDuke Salvia M.D.

## 2011-03-27 NOTE — Consult Note (Signed)
NAME:  [name] [name], YOAKUM NO.:  [id number]   MEDICAL RECORD NO.:  [id number]          PATIENT TYPE:  OIB   LOCATION:  1966                         FACILITY:  [facility]   PHYSICIAN:  Minus Breeding, MD, FACCDATE OF BIRTH:  07-01-1956   DATE OF CONSULTATION:  11/19/2006  DATE OF DISCHARGE:                                 CONSULTATION   PRIMARY CARE PHYSICIAN:  Duke Salvia, M.D.   CARDIOLOGIST:  Ernestine Mcmurray, MD,FACC.   PROCEDURES:  Left heart catheterization/coronary arteriography   INDICATIONS:  A patient with chest pain suggestive of his unstable  angina (411.1).  He had previous stenting to his LAD and also a cutting  balloon angioplasty to an obtuse marginal.   PROCEDURE:  Left heart catheterization was performed via the right  femoral artery.  The artery was cannulated using an anterior wall  puncture.  A #4-French arterial sheath was inserted via the modified  Seldinger technique.  Preformed Judkins and a pigtail catheter were  utilized.  The patient tolerated the procedure well and left the lab in  stable condition.   RESULTS:   HEMODYNAMICS:  LV 128/4, AO 122/95.   CORONARY ARTERIES:  Left main was normal.  The LAD had ostial 25%  stenosis.  There was proximal mid stent which was widely patent with mid  long 30% stenosis.  First diagonal was moderate size with ostial 40%  stenosis.  The circumflex in the AV groove was normal.  There was a mid  obtuse marginal which was large and branching.  A superior small branch  had an ostial 30% stenosis.  Right coronary artery was large and dominant.  PDA was moderate size  with luminal irregularities.  Posterolateral was large with proximal 30%  stenosis.   LEFT VENTRICULOGRAM:  Left ventriculogram was obtained in the RAO  projection.  The EF was 65% with normal wall motion.   CONCLUSIONS:  1. Normal left the function.  2. Mild residual nonobstructive coronary disease.  3. The patient had patent LAD  stent.   PLAN:  Continue secondary risk reduction.  The patient will follow Dr.  Dannielle Burn and with his primary care doctor for evaluation of non-anginal  chest pain.      Minus Breeding, MD, [facility]  Electronically Signed     JH/MEDQ  D:  11/19/2006  T:  11/19/2006  Job:  [id number]   cc:   Duke Salvia, M.D.  Ernestine Mcmurray, MD,FACC

## 2011-03-27 NOTE — Cardiovascular Report (Signed)
NAMEFREDRICH, CORY               ACCOUNT NO.:  [id number]   MEDICAL RECORD NO.:  [id number]          PATIENT TYPE:  INP   LOCATION:  6524                         FACILITY:  [facility]   PHYSICIAN:  Loretha Brasil. Lia Foyer, M.D. [facility] OF BIRTH:  Mar 10, 1956   DATE OF PROCEDURE:  11/12/2004  DATE OF DISCHARGE:                              CARDIAC CATHETERIZATION   INDICATIONS:  Mr. Penning is a 55 year old gentleman who has had some  recurrent episodes of shortness of breath and some diaphoresis, according to  his wife.  He has diabetes.  He has also had stenting of the left anterior  descending artery in at least two locations.  The current study was done to  assess coronary anatomy.   His enzymes were negative.   PROCEDURES:  1.  Left heart catheterization.  2.  Selective coronary arteriography.  3.  Selective left ventriculography.   DESCRIPTION OF PROCEDURE:  The patient was brought to the catheterization  laboratory and prepped and draped in the usual fashion.  We used a Smart  needle to enter the right femoral artery.  Because of scar tissue, this was  somewhat difficult to enter and we predilated.  Following this, central  aortic and left ventricular pressures were measured with a pigtail.  Ventriculography was performed in the 30 degree RAO projection.  Following  this, coronary arteriography was done in standard angiographic views.  He  tolerated the procedure well and there were no major complications.  Importantly, I reviewed the films with Dr. Vicenta Aly in detail.  We looked  at the old films, as well as the new films.  I also looked at the films with  the patient's wife and we reviewed these as well.  He was taken to the  holding area in satisfactory clinical condition.   HEMODYNAMIC DATA:  1.  Central aortic pressure 101/70.  Mean 86.  2.  Left ventricular pressure 99/13.  3.  No gradient on pullback across the aortic valve.   ANGIOGRAPHIC DATA:  1.  Ventriculography was  performed in RAO projection.  There was generalized      mild hypokinesis.  The ejection fraction was calculated at 57%.  No      definite regional wall motion abnormalities were noted.  2.  The left main coronary artery was a large caliber vessel that appeared      to be free of critical disease.  3.  The left anterior descending artery coursed to the apex.  There were two      previously placed stents that straddled the origin of a diagonal.  The      diagonal has also been dilated.  Both stents appear to be widely patent      and the diagonal itself is also patent.  In between the stents, there is      mild narrowing, but this may be a function more of the opening up of the      areas by the stents.  In the LAO projection, there is a slight filling      abnormality noted just beyond the distal  stent.  In multiple subsequent      views taken in multiple locations, there is perhaps a 50% eccentric      plaque beyond the distal extent of the distal stent.  In multiple views      this does not appear to be high grade or critical.  4.  There is a tiny significant ramus intermedius vessel.  5.  The circumflex consists predominantly of one large bifurcating marginal      branch and a smaller AV circumflex.  The smaller AV circumflex is free      of critical disease.  The bifurcating marginal branch bifurcates      subsequently into a small superior sub branch and a large inferior sub      branch.  There is about 40% disease extending across the origin of the      superior sub branch that involves the inferior sub branch.  The superior      sub branch itself has about 70% narrowing.  When compared to the      previous study, this has perhaps slightly progressed.  It is not ideal      for percutaneous intervention.  He had likewise to some extent present      prior to all of this.  6.  The right coronary artery is a large caliber vessel that has perhaps 20%      narrowing distally, but again  this does not appear to be critical.  The      right coronary artery is a large caliber vessel distally consisting of a      posterior descending and posterolateral system.  There is at most 20%      narrowing prior to the origin of the PDA.   CONCLUSIONS:  1.  Low normal ejection fraction.  2.  Continued patency of the left anterior descending stent straddling the      diagonal, as well as patency of the diagonal with about 50% eccentric      stenosis of the distal left anterior descending beyond the distal stent,      which does not appear to be dramatically changed from the previous      study.  3.  Bifurcational stenosis involving the superior and inferior sub branch of      the obtuse marginal branch as noted above.  4.  Noncritical irregularities of the right coronary artery.   DISPOSITION:  When compared to the previous study, the LAD does not look  worse and the stents are widely patent.  Importantly, there is bifurcational  disease of the circumflex.  The large inferior sub branch does not appear to  be hemodynamically compromised.  There is some narrowing of the superior sub  branch, but it is about 70% and it has a 90 degree takeoff from the main  channel, which would involve a bifurcation intervention.  It does not appear  to be critical enough to be causing the patient's symptoms.  I have a  thorough discussion with the patient's wife, who thinks that this could in  part be related to some sort of asthma.  Hot and cold seems to make a  substantial difference in the type of symptoms.  We will get some pulmonary  function studies.  Dr. Vicenta Aly and I are in agreement that a conservative  medical treatment program would be appropriate at the present time despite  the patient's frequent symptoms.       TDS/MEDQ  D:  11/12/2004  T:  11/12/2004  Job:  [id number]   cc:   [name], M.D. [facility]   Dola Argyle, M.D.

## 2011-03-27 NOTE — H&P (Signed)
NAME:  [name] [name], [name] [name]                         ACCOUNT NO.:  [id number]   MEDICAL RECORD NO.:  [id number]                   PATIENT TYPE:  INP   LOCATION:  2903                                 FACILITY:  [facility]   PHYSICIAN:  Scarlett Presto, M.D.                DATE OF BIRTH:  August 08, 1956   DATE OF ADMISSION:  12/20/2003  DATE OF DISCHARGE:                                HISTORY & PHYSICAL   PRIMARY CARE PHYSICIAN:  Dr. Brigitte Pulse in [place].   CARDIOLOGIST:  [name], M.D. [facility]   HISTORY OF PRESENT ILLNESS:  [name] [name] is a 55 year old male who presents  with progressive chest discomfort over several weeks. He has noted that with  less and less effort he is getting more and more chest discomfort. He  presented to [facility] Emergency Department and was transferred  here to [facility] with intermittent chest discomfort along  the way, which responded to beta blockers and nitrates. He stated that  yesterday around lunch time he developed a relatively severe chest  discomfort, checked his blood pressure and found it to be elevated. Felt hot  and flushed, similar to the pain when he has had in the past when he had a  stent placed in his LAD. The patient continued to be intermittent and he  still had it today and his wife decided it was probably time to be evaluated  and he was brought to the emergency department where he was treated at that  time. Associated with the pain is shortness of breath, diaphoresis, and  nausea. Significant improvement now with nitroglycerin and beta blocker to  the point where he is without pain. The total duration of the pain appears  to be quiet prolonged, in the neighborhood of seven to eight hours, but  significantly improved with the beta blockers and nitrates.   PAST MEDICAL HISTORY:  Significant for coronary artery disease status post  multiple caths, the last was in February 2004. At that time he had an  ejection fraction of 50%.  He had a 90% mid LAD lesion and a 50-75% first  diagonal lesion. No disease in the circumflex and a 30% proximal right  coronary lesion. He received a stent to the LAD in February 2004. A relook  at in March with no restenosis. He has a Taxus stent in his LAD. He has  hyperlipidemia and mildly elevated liver function studies, obesity, diabetes  mellitus, hypertension, chronic low back pain, history of gastritis, and  depression.   SOCIAL HISTORY:  He is married. He recently had a son. He lives in [place],  [place]. His wife works in [place], [place]. He does not smoke, does  not drink alcohol. He is unemployed.   FAMILY HISTORY:  His father is alive at age 84, but has coronary artery  disease status post percutaneous revascularization. His mother is alive at  age 43  with hypertension. He has two sisters both of which have asthma and a  brother who has multiple sclerosis.   CURRENT MEDICATIONS:  1. Nitroglycerin drip.  2. Glucophage 1000 mg b.i.d.  3. Insulin 70/30 18 units in the morning, 16 units in the evening.  4. Actos 30 mg a day.  5. Plavix 75 mg a day.  6. Altace 2.5 mg a day.  7. Toprol XL 100 mg a day.  8. Depakote 250 mg a day.  9. Lexapro 200 mg a day.  10.      Wellbutrin 150 mg b.i.d.  11.      Nexium 40 mg b.i.d.  12.      Aspirin 81 mg a day.  13.      Advair 100 mg b.i.d.  14.      Trazodone 50 mg a day.  15.      Lopid 600 mg b.i.d.  16.      Sublingual nitroglycerin on a p.r.n. basis.   REVIEW OF SYSTEMS:  His review of systems is relatively detailed. He  describes episodes of chills and sweats, although these are not well-  delineated and occur intermittently. He does get nitroglycerin headaches. He  does not have any change in his vision. Occasionally will have wheezing, but  does not have any now. He does have panic attacks on a relatively frequent  basis. Arthralgias. He does have gastroesophageal reflux disease. All of his  other organ systems,  review of systems are negative.   PHYSICAL EXAMINATION:  GENERAL:  He is a well-developed, well-nourished  moderately obese white male in no apparent distress who is alert and  oriented x4 and a reasonably well historian.  VITAL SIGNS:  He is afebrile with a pulse of 73 and sinus. Respiratory rate  is 18. His blood pressure is 120/65. Saturation 99% on 2 L.  HEENT:  Unremarkable. He has good dentition. His neck is supple. There is no  jugular vein distention or carotid bruit.  NECK:  His thyroid is midline. He does not have any lymphadenopathy.  LUNGS:  Clear to auscultation.  CARDIOVASCULAR:  He has a nondisplaced point of maximal impulse with no  elicited thrills. There is no murmur noted.  ABDOMEN:  Soft and nontender with normal active bowel sounds. There is no  bruits. He has no hepatosplenomegaly.  GU/RECTAL:  Deferred.  EXTREMITIES:  His pulses are 2+ of his bilateral lower extremities as well  as upper extremities. He has no evidence of edema.  MUSCULOSKELETAL:  Unremarkable.  NEUROLOGIC:  Grossly intact.   LABORATORY DATA:  Chest x-ray done at [facility] shows no acute disease.  Electrocardiogram shows a sinus rhythm at a rate of 79 with nonspecific ST-T  wave changes. No Q-waves concerning for an old myocardial infarction. No ST-  T wave changes concerning for active ischemia. He has normal intervals.   His white blood cell count is 3.8, hemoglobin 16, hematocrit 46. Platelets  are 187,000. His PT is 13.4, PTT is 25. His INR is 1.1, sodium 132,  potassium 4.2, chloride 105, bicarb 23, glucose 386. BUN 17, creatinine 1.1.  His total CK was 116 and MB fraction of 2.1. His troponin was less than  0.01.   ASSESSMENT:  This is a gentleman who has significant risk factors for  coronary artery disease, known coronary artery disease, and progressive  chest discomfort which has responded to nitroglycerin and beta blockers. I am quite concerned that this is unstable angina and  progressive symptoms.  His glucose is out of control. His blood pressure was poorly controlled, the  patient will be put on medications. He is currently without pain. His blood  pressure and heart rate are reasonably controlled. I am going to start him  on heparin and Integrilin to try to get him stabilized. If  coronary syndrome, then he will need a heart catheterization in the morning.  I have discussed the risks, benefits, potential outcome, and alternatives  for heart catheterization with him and he appears to understand and wishes  to proceed with heart catheterization in the morning.                                                Scarlett Presto, M.D.    JH/MEDQ  D:  12/20/2003  T:  12/20/2003  Job:  [id number]

## 2011-03-27 NOTE — Discharge Summary (Signed)
[name], [name] NO.:  [id number]   MEDICAL RECORD NO.:  [id number]          PATIENT TYPE:  INP   LOCATION:  5621                         FACILITY:  [facility]   PHYSICIAN:  Glori Bickers, M.D. LHCDATE OF BIRTH:  1956-08-12   DATE OF ADMISSION:  11/11/2004  DATE OF DISCHARGE:  11/13/2004                                 DISCHARGE SUMMARY   PROCEDURE:  1.  Cardiac catheterization.  2.  Coronary arteriogram.  3.  Left ventriculogram.   HOSPITAL COURSE:  [name] [name] is a 55 year old male with a history of  coronary artery disease.  He had a stent to the LAD and his last  catheterization was in February 2005.  At that time, the stent was patent  and there was moderate coronary artery disease in other vessels.  He was  working on the computer and suddenly developed left-sided chest pain.  Nitroglycerin did not relieve the pain and he called EMS.  He was  transported to [facility] and admitted for further evaluation and  treatment.   Cardiac enzymes were negative for MI.  It was felt that he needed cardiac  catheterization to further define his anatomy and this was performed on  January 4. 2006.  The cardiac catheterization showed a 50% eccentric  stenosis distal in the LAD, distal to the previously stents which were  patent.  The circumflex had a 40% stenosis and the OM had a 70% stenosis.  The RCA had some mild irregularities and a 20% stenosis.  His vessels had a  diabetic appearance.  His EF was 57%.  It was felt that he had mild  progression of circumflex and OM lesions and moderate LAD disease.  He  recommended continued medical therapy and checking PFTs.   As part of his evaluation, [name] [name] had a lipid profile which showed a  total cholesterol of 183, triglycerides 395, HDL 21, and LDL 83.  He had  been placed on Lopid b.i.d. and is to continue this and follow up as an  outpatient.  Additionally, he had a hemoglobin A1c checked and it was  11.2%.  The patient admitted to poor diet control and he stated that he had not seen  a family doctor in several months.  He is to continue on his home  medications at this time and try to follow up and obtain a family physician  as soon as possible.  There was concern for asthma so pulmonary function  testing was done.  The pulmonary function testing shows spirometry within  normal limits and diffusion capacity within normal limits.   [name] [name] was ambulating without chest pain or shortness of breath and  considered stable for discharged on November 13, 2004 with outpatient followup  arranged.   DISCHARGE DIAGNOSES:  1.  Chest pain, no critical coronary artery disease by cardiac      catheterization.  2.  Gastritis.  3.  Gastroesophageal reflux disease.  4.  Depression.  5.  Hyperlipidemia/dyslipidemia.  6.  Chronic neck and back pain.  7.  Hypertension.  8.  Obesity.  9.  Diabetes with a hemoglobin A1c of 11.2%.  10. History of asthma.  11. History of elevated liver function tests.  12. Status post stent to the left anterior descending with three admissions      for chest pain since that stent.  33. Family history of coronary artery disease in his father in his 35s.   DISCHARGE INSTRUCTIONS:  His activity level is to include no strenuous  activity and no driving for 2 days.  He is to stick to a low fat, diabetic  diet.  He should call our office for problems with the cath site.  He is to  follow up with Dr. Domenic Polite on November 28, 2004 on November 28, 2004 which is  Friday at 12:15 p.m.  He is to obtain a primary care physician in [place].   DISCHARGE MEDICATIONS:  1.  Glucophage 1000 mg b.i.d., restart November 15, 2004.  2.  70/30 insulin, 18 units q.a.m. and 16 units q.p.m.  3.  Aspirin 81 mg daily.  4.  Actos 30 mg daily.  5.  Altace 2.5 mg daily.  6.  Toprol XL 100 mg daily.  7.  __________ 20 mg daily.  8.  Wellbutrin 150 mg b.i.d.  9.  Depakote 250 mg t.i.d.  10.  Trazodone 50 mg q.h.s.  11. Lopid 600 mg b.i.d.  12. Nexium 40 mg b.i.d.  13. Advair 100/50 b.i.d.  14. Sublingual nitroglycerin p.r.n.   If he is not currently taking the Nexium, we will give him a prescription  for Protonix.      Rhon   RB/MEDQ  D:  11/13/2004  T:  11/13/2004  Job:  [id number]
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
## 2011-03-27 NOTE — H&P (Signed)
NAME:  [name] [name], [name] [name] NO.:  [id number]   MEDICAL RECORD NO.:  [id number]                   PATIENT TYPE:  INP   LOCATION:                                       FACILITY:  [facility]   PHYSICIAN:  Jenkins Rouge, M.D. [facility]              DATE OF BIRTH:  1956-05-05   DATE OF ADMISSION:  01/17/2003  DATE OF DISCHARGE:                                HISTORY & PHYSICAL   REASON FOR ADMISSION:  Transfer from [facility] for unstable angina.   HISTORY OF PRESENT ILLNESS:  [name] [name] is a 55 year old patient of [name] [name]  in [place]. He was just discharged from [facility] on January 09, 2003.  He had initially presented to [facility] with unstable angina. Catheterization  done by Dr. Olevia Perches showed significant 90% stenosis in the mid LAD. He  underwent Cipher Stent placement with a 2.5 x 18 mm Cipher Stent by Dr.  Olevia Perches. My review of the angiogram showed that the distal portion of the  Stent may have been under deployed and there was residual 70% disease in the  diagonal branch, which appeared just distal to the Stent. He had some spasm  of the proximal right coronary artery but no affixed disease in the  circumflex or right coronary artery. His ejection fraction was normal. The  patient was discharge with multiple medications including Plavix, which he  has been taking. His risk factors include hypertension, low HDL of 27,  poorly controlled diabetes mellitus requiring some insulin and positive  family history. He does not smoke. 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 He lives in [place]. The patient started having substernal  chest pain tonight which was similar to his previous pain. He was taken to  the [facility] emergency room and finally the pain was relieved with  Nitroglycerin and Morphine. He had no acute changes on his EKG at [facility].  He is transferred to [facility] for further workup and  catheterization.   MEDICATIONS:   Include Plavix 75 mg per day, Toprol 25 mg per day, Lexapro 20  mg per day, Wellbutrin 300 mg per day, Amaryl 4 mg per day, Nexium 40 mg per  day, Depakote 250 mg twice a day, Relafen as needed 750 mg, Humulin R 12  units twice a day, Glucophage 1 gram by mouth twice a day, and Altace 2.5 mg  each day.   PHYSICAL EXAMINATION:  VITAL SIGNS: Blood pressure 105/70. Pulse 72 and  regular.  LUNGS:  Clear.  HEART:  Carotids are normal. There is normal S1 and S2. Normal heart sounds.  ABDOMEN: Benign. Previous catheterization site is well healed.  EXTREMITIES: Distal pulses are intact with no edema.   LABORATORY DATA:  Initial enzymes at [facility] were negative.   DIAGNOSTIC IMPRESSION:  His EKG shows sinus rhythm with no acute changes and  reasonable R  wave progression.   IMPRESSION:  Recurrent chest pain after recent Cipher Stent. The patient has  been compliant with his Plavix. We will keep him on Heparin and then  Tegrelin. Given the residual disease and osteal disease in the diagonal, he  will need repeat heart catheterization. Hopefully, we can do this tomorrow.  The patient will have his Glucophage held in lieu of his catheterization. If  he has recurrent pain, we will start him on IV Nitroglycerin. He does have  an HDL of  27 and may benefit from the addition of Zocor to his medical regimen. He  certainly needs tighter control of his diabetes mellitus and this will have  to be followed up with [name] [name]. I do not have a recent hemoglobin A1C on  him. Further recommendations will be based on the results of his repeat  catheterization.                                               Jenkins Rouge, M.D. [facility]    PN/MEDQ  D:  01/16/2003  T:  01/17/2003  Job:  [id number]

## 2011-03-27 NOTE — Discharge Summary (Signed)
NAME:  [name], [name]                         ACCOUNT NO.:  [id number]   MEDICAL RECORD NO.:  [id number]                   PATIENT TYPE:  INP   LOCATION:  6532                                 FACILITY:  [facility]   PHYSICIAN:  Marijo Conception. Wall, M.D. [facility]            DATE OF BIRTH:  [birth year]   DATE OF ADMISSION:  01/06/2003  DATE OF DISCHARGE:  01/09/2003                           DISCHARGE SUMMARY - REFERRING   PROCEDURE:  1. Cardiac catheterization.  2. Coronary arteriogram.  3. Left ventriculogram.   HOSPITAL COURSE:  [name] [name] is a 55 year old male with no known history of  coronary artery disease. He went to [facility] in [place] for  chest pain that was associated with dyspnea. He had relief in the emergency  room with nitroglycerin. He had some intermittent pain on a nitroglycerin  drip at [facility], and it was felt that he needed further evaluation. He was  transferred to [facility] for further evaluation and cardiac  catheterization.   [name] were negative for MI, and he had a cardiac  catheterization performed on 01/08/03. The cardiac catheterization showed a  normal left main and a LAD with a 90% mid stenosis. There was a 30% more  distal stenosis, and the first diagonal had a 50 to 70% stenosis. The  circumflex system had no significant disease, and the RCA had a 30% proximal  lesion. His EF was approximately 50%. He had PTCA and a Cypher stent to the  LAD, reducing the stenosis from 90% to less than 10% with TIMI III flow. He  tolerated the procedure well.   The next day, his groin was without ecchymosis, bruit, or hematoma. He was  ambulating without chest pain or shortness of breath. He was considered  stable for discharge on 01/09/03 with outpatient followup arranged.   LABORATORY DATA:  Sodium 136, potassium 4.1, chloride 100, CO2 29, BUN 13,  creatinine 1.1, glucose 206. Hemoglobin 14.9, hematocrit 41.7, WBC 4.1,  platelets  161.   DISCHARGE CONDITION:  Improved.   DISCHARGE DIAGNOSES:  1. Unstable anginal pain, status post percutaneous transluminal coronary     angioplasty and stent to the left anterior descending artery with a     Cypher stent this admission.  2. Poorly controlled insulin-dependent diabetes mellitus, hemoglobin A1c     10.0 this admission.  3. Dyslipidemia with a low HDL.  4. History of chronic back pain, status post lumbar disk surgery.  5. Gastroesophageal reflux disease.  6. History of hypertension.  7. Depression.   DISCHARGE INSTRUCTIONS:  His activity level is to include no driving,  sexual, or strenuous activity for two days. He is to call the office for  problems with the catheterization site. He is to get a BMET in one week and  go to the office before 10 a.m. He is to follow up with Roque Cash for Dr.  Domenic Polite on 3/11 at  2:30. He is to follow up with Dr. Brigitte Pulse as needed and see  him about his diabetes medications in approximately a week.    DISCHARGE MEDICATIONS:  1. Coated aspirin 325 mg daily.  2. Plavix 75 mg daily.  3. Toprol-XL 25 mg daily.  4. Lexapro 20 mg daily.  5. Wellbutrin 300 mg daily.  6. Amaryl 4 mg daily.  7. Nexium 40 mg daily.  8. Depakote 250 mg b.i.d.  9. Relafen 750 mg one or two times a day.  10.      Humulin R 12 units b.i.d.  11.      Glucophage 1,000 mg b.i.d., restarted on Thursday.  12.      Nitroglycerin p.r.n.  13.      Altace 2.5 mg daily (titrate as outpatient).     Davis Gourd, P.A. [place]. Verl Blalock, M.D. [facility]    RG/MEDQ  D:  01/09/2003  T:  01/09/2003  Job:  [id number]

## 2011-03-27 NOTE — Cardiovascular Report (Signed)
[name] [name], [name] [name] NO.:  [id number]   MEDICAL RECORD NO.:  [id number]          PATIENT TYPE:  INP   LOCATION:  6524                         FACILITY:  [facility]   PHYSICIAN:  Ethelle Lyon, M.D. LHCDATE OF BIRTH:  1956/07/18   DATE OF PROCEDURE:  01/13/2006  DATE OF DISCHARGE:                              CARDIAC CATHETERIZATION   PROCEDURE:  Cutting balloon angioplasty of the medial branch of the first  obtuse marginal branch.   INDICATIONS:  [name] is a 55 year old gentleman with coronary disease  dating back to 2003. He has had multiple admissions for chest discomfort and  ruled out for myocardial infarction. A similar episode prompted  hospitalization today. He underwent diagnostic angiography by Dr. Domenic Polite.  This demonstrated the LAD stent to be widely patent. There is severe  stenosis in a small JL diagonal. There is also an 85% stenosis in a branch  of the first obtuse marginal. I was asked perform a percutaneous  revascularization of this.   PROCEDURAL TECHNIQUE:  Informed consent was obtained. Under 1% lidocaine  local anesthesia, the pre-existing #6 French right common femoral arterial  sheath was exchanged over a wire for a fresh sheath. A #6 French left 3.5  guide was advanced over a wire and engaged in the ostium of the left main. A  Prowater wire was advanced into the distal portion of the marginal branch.  Cutting balloon angioplasty was performed using a 2 x 6 mm cutting balloon  at 10 atmospheres for 1 minute. Repeat angiography demonstrated less than  10% residual stenosis, no dissection and TIMI III flow. The wire was then  removed. The patient was then transferred to holding room, having tolerated  the procedure well.   COMPLICATIONS:  None.   IMPRESSION/RECOMMENDATIONS:  Successful cutting balloon angioplasty of the  medial branch of the first obtuse marginal. He should be maintained on  aspirin indefinitely. Plavix will not be  continued.      Ethelle Lyon, M.D. [facility]  Electronically Signed     WED/MEDQ  D:  01/13/2006  T:  01/14/2006  Job:  [id number]   cc:   [name], M.D. [facility]  [address]  [place]  [place] [address]   Duke Salvia, M.D.  Fax: [phone]

## 2011-03-27 NOTE — Consult Note (Signed)
NAME:  [name] [name], [name] [name]                         ACCOUNT NO.:  [id number]   MEDICAL RECORD NO.:  [id number]                   PATIENT TYPE:  INP   LOCATION:  3706                                 FACILITY:  [facility]   PHYSICIAN:  Mayme Genta, M.D.             DATE OF BIRTH:  [birth year]   DATE OF CONSULTATION:  02/14/2003  DATE OF DISCHARGE:  02/14/2003                                   CONSULTATION   REASON FOR CONSULTATION:  Chest pain, probably noncardiac.   HISTORY OF PRESENT ILLNESS:  The patient is a 55 year old white male who is  known to me from prior evaluation two years ago with multiple GI problems at  that time, including NASLD, GERD, diarrhea.  He has not been seen since  11/2000.   The patient's current admission follows recent difficulties with coronary  artery disease.  Initially admitted in February with findings of 90% LAD  lesion requiring PTCA and stent placement.  He had recurrent chest pain post  stent placement and underwent repeat cardiac catheterization 01/18/2003.  Admitted again 01/28/2003.  At that time he had a negative Cardiolite.   The patient's current admission was precipitated four days ago when he was  in the emergency room with his infant son who had sustained a seizure.  While in the emergency room, he had moderately severe chest pain which led  to his admission.  EKG was negative, and cardiac enzymes were normal.  He  has had resolution of his chest pain over the past two days.  GI evaluation  sought for possible noncardiac source of his symptoms.   The patient has dysphagia with meat and bread sticking in the neck region  which he then has to expectorate.  This occurs approximately once weekly.  He has not mentioned this or returned for followup care.  There is no  associated pyrosis.  He denies regurgitation, water brash.  No odynophagia.  He remains on Nexium 40 mg daily.   Denies abdominal pain, bloating, nausea, or vomiting.  Weight has  reduced  some 20 pounds since he was last seen two years ago, mostly due to his  coronary artery disease.   Bowel habits remain frequent, having several semi-formed stools daily.  Denies blood, pus, or mucus.  No change in stool caliber.  Found to be  Hemoccult-positive on admitting physical examination.  The patient has not  undergone colonoscopy to date.   PAST MEDICAL HISTORY:  1. Coronary artery disease - status post PTCA with stent placement.  2. Metabolic syndrome - hypertension, hyperlipidemia, obesity, diabetes.  3. Chronic back pain - NSAID therapy.  4. Depression.  5. GERD.   CURRENT MEDICAL REGIMEN:  1. Plavix 75 mg daily.  2. Glucophage 1000 mg bid  3. Amaryl 4 mg daily.  4. Lexapro 20 mg daily.  5. Depakote 250 mg t.i.d.  6. Altace 2.5 mg daily.  7. Toprol-XL 25  mg daily.  8. Aspirin 325 mg daily.  9. Insulin 70/30, 7 units b.i.d.  10.      Relafen 750 mg daily.  11.      Nexium 40 mg daily.  12.      Nitroglycerin p.r.n.   ALLERGIES:  NO KNOWN DRUG ALLERGIES.   SOCIAL HISTORY:  Unemployed.  Applying for disability.  Married.  On infant  son.  Presently living in [place], [place].  No tobacco, no alcohol.   FAMILY HISTORY:  The patient's mother has hypertension.  Father is healthy.  Several paternal relatives with coronary artery disease.  Sister's ages 60,  71, both healthy.  Brother 74 with MS.  Infant son healthy.   PHYSICAL EXAMINATION:  GENERAL:  Healthy-appearing middle-aged white male,  alert and oriented, no acute distress.  VITAL SIGNS:  Stable.  HEENT:  2-cm sebaceous cyst over the right frontal region, mobile,  nontender, noninflamed.  EOMI.  PERRL.  Anicteric sclerae.  Pink  conjunctivae.  Mouth with no oropharyngeal lesion.  Normal lips, gums,  tongue.  NECK:  Supple.  No adenopathy or thyromegaly.  No carotid bruits.  CHEST:  Clear to auscultation.  No gynecomastia.  CARDIAC:  Regular rhythm.  No gallop, no murmur.  ABDOMEN:   Overweight, soft, nondistended, nontender.  No palpable  organomegaly with __________ vein 10 cm to percussion.  No palpable H.  No  firmness or mass felt.  Bowel sounds active without  __________ bruit or  splash.  No caput, venous prominence, or fluid wave detected.  Rectal not  repeated, with Hemoccult-positive stool on admission.  EXTREMITIES:  Without clubbing, cyanosis, or edema.  SKIN:  No lesion.   LABORATORY DATA:  Reviewed.  Normal CBC, including hemoglobin 15.3,  hematocrit 42.4, WBC 5.0 K, platelets 189 K.  [place].  Cardiac enzymes  negative.  CMET:  Normal except for a glucose of 149, calcium 8.2, albumin  3.3 and normal.  AST 29, ALT 32, total bilirubin 0.7, alkaline phosphatase  39.  Hemoglobin A1C 8.9.   ASSESSMENT:  1. Chest pain, uncertain etiology.  Symptoms are described as a pressure-     type discomfort that is competent in nature.  This could be peptic in     etiology.  Risk factors include daily aspirin and Relafen use.  He is on     Nexium 40 mg daily which should offer some prophylaxis but may not be     quite sufficient.  Cannot rule out gallbladder disease, though the     symptoms are not episodic, intense, food -related, and there is no     associated nausea or vomiting, all of which make this diagnosis less     likely.  I do think that screening should be performed.  2. Hemoccult-positive stool, possible due to upper gastrointestinal     pathology.  Cannot rule out lower gastrointestinal lesion.  Diarrhea has     been unchanged and chronic in nature.  Stool evaluation was unrevealing     two years ago.  I doubt there was any significant underlying colopathy.     Whether or not colonoscopy is warranted will depend on upper endoscopic     findings.  3. Nonalcoholic steatohepatic liver disease, prior history of nonalcoholic     steatohepatitis with transaminitis and evidence of fatty infiltration on    abdominal ultrasound.  Liver enzymes presently normal,  probably     reflecting weight reduction over the past two years.  RECOMMENDATIONS:  1. Panendoscopy.  2.     Hold aspirin and NSAID.  3. Abdominal ultrasound.  4. Consider colonoscopy.                                               Mayme Genta, M.D.    JRM/MEDQ  D:  02/14/2003  T:  02/15/2003  Job:  [id number]   cc:   Monico Blitz  [place]  [place] [id number]  Fax: [phone]   [name], M.D. [facility]

## 2011-03-27 NOTE — H&P (Signed)
NAME:  [name], [name] NO.:  [id number]   MEDICAL RECORD NO.:  [id number]                   PATIENT TYPE:  INP   LOCATION:  3706                                 FACILITY:  [facility]   PHYSICIAN:  Glori Bickers, M.D. [facility]          DATE OF BIRTH:  04/06/1956   DATE OF ADMISSION:  02/10/2003  DATE OF DISCHARGE:                                HISTORY & PHYSICAL   PRIMARY CARE PHYSICIAN:  Dr. Monico Blitz in [place], [place].   CARDIOLOGIST:  [name], M.D., [facility].   HISTORY OF PRESENT ILLNESS:  The patient is a 55 year old obese white male  with diabetes, hypertension, hyperlipidemia, and CAD status post LAD stent  on January 08, 2003, admitted with recurrent chest pain/unstable angina.   The patient was admitted in February of this year with chest pain and  shortness of breath.  Cardiac catheterization showed EF of approximately 50%  with an LAD 90% mid lesion, first diagonal 50 to 75% lesion, left circumflex  was okay and RCA showed a 30% proximal lesion.  He subsequently underwent a  PTCA and stent to the mid LAD lesion with a 2.5 x 18 mm CYPHER stent.  He  said he felt better for a few days and then developed recurrent chest pain.  He underwent a re-look cardiac catheterization on January 18, 2003, without  any significant restenosis in his stent.  He was admitted again on January 28, 2003, for chest pain.  He had a treadmill Cardiolite which was negative for  ischemia.  Since that time, he has had chest pain approximately q.1h. which  he describes as squeezing.  This was accompanied by shortness of breath and  diaphoresis.  The pain occurs both at rest and with exertion but worse with  exertion.  It is routinely relieved with nitroglycerin.  He reports that  over the past week, his pain has been increasing in frequency and severity.   Tonight he was at the mall with his wife and infant son when his son had a  recurrent seizure.   They went to the hospital to get his son checked out and  while in the emergency room, the patient developed a 5/10 chest pain with  shortness of breath and diaphoresis.  He did not have nitroglycerin with him  so he was admitted to the ER.  EKG showed no evidence of ischemia and the  first set of cardiac enzymes were normal.  He was started on heparin and  Integrelin and he was given nitroglycerin for his pain.  He is now chest  pain-free.   REVIEW OF SYSTEMS:  He denies fevers, chills, nausea, vomiting, melena,  bright red blood per rectum or CHF.  He does have chronic low back pain for  which he takes high dose NSAIDs.  He also has some mild abdominal pain.  He  reports high levels of emotional stress  and anger in his life.  He continues  to take his Plavix on a daily basis.   PAST MEDICAL HISTORY:  1. CAD status post LAD stent in March 2001 with no evidence of restenosis on     re-look cardiac catheterization on January 18, 2003.  Treadmill Cardiolite     on January 28, 2003, negative for ischemia.  2. Obesity.  3. Hypertension.  4. Hyperlipidemia with elevated triglycerides and low HDL.  Lipid panel on     January 29, 2003, showed total cholesterol 181, triglycerides 227, HDL 30,     and LDL of 96.  5. Metabolic syndrome.  6. Chronic back pain.  7. Depression/hostility.  8. Epistaxis with Integrelin.  9. GERD.  10.      Heme-positive stools with reportedly negative EGD several years     ago.   MEDICATIONS:  1. Plavix 75 a day.  2. Glucophage 1000 b.i.d.  3. Amaryl 4 mg daily.  4. Lexapro 20 mg daily.  5. Depakote 250 mg t.i.d.  6. Altace 2.5 mg daily.  7. Toprol 25 mg daily.  8. Enteric coated aspirin 325 mg daily.  9. Insulin 70/30 7 units b.i.d.  10.      Relafen 750 mg daily.  11.      Nexium 40 mg daily.  12.      Nitroglycerin p.r.n.   ALLERGIES:  No known drug allergies.   SOCIAL HISTORY:  He is unemployed.  He formerly worked at [employer] for  [employer] but now he  is applying for disability.  He is married with an infant  son. They live in [place], [place].  He denies tobacco or alcohol.   FAMILY HISTORY:  This is notable for CAD in multiple male relatives.   PHYSICAL EXAMINATION:  GENERAL APPEARANCE:  He is emotionally stressed.  He  is otherwise in no acute distress.  VITAL SIGNS:  He is afebrile, blood pressure 122/72, heart rate 83.  HEENT:  Sclerae are anicteric.  NECK:  Supple without JVD.  Carotids are 2+ bilaterally without bruits.  LUNGS:  Clear to auscultation.  CARDIOVASCULAR:  Regular rate and rhythm with normal S1 and S2.  No murmurs,  rubs, or gallops.  ABDOMEN:  Obese but otherwise benign.  EXTREMITIES:  There is no clubbing, cyanosis, or edema.  Femoral pulses are  1+ bilaterally without bruits.  He is markedly tender to palpation over the  right femoral artery.  His distal pulses are 2+ bilaterally.   LABORATORY DATA:  His white count is 4.6, hematocrit is 44%, platelets  197,000.  Sodium 135, potassium 4.0, chloride 99, bicarb 26, BUN is 14,  creatinine 1.1, glucose 347.  CK is 125, MB is less than 1.0, troponin is  less than 0.04.   EKG is normal sinus rhythm at 91.  There is no ischemia.   Portable chest x-ray shows no acute air space disease.   ASSESSMENT:  This is a 55 year old white male as above with metabolic  syndrome, status post recent left anterior descending stent presents with  chronic but somewhat progressive chest pain concerning for unstable angina.  His EKG and first set of cardiac enzymes are negative.   PLAN:  1. Admit for rule out myocardial infarction on telemetry with cycling of     cardiac markers.  2. He will undergo cardiac catheterization Monday to clearly define his     coronary anatomy and look for in-stent restenosis. We will hold his  Metformin in anticipation of cardiac catheterization. 3. We will continue to treat him with Lovenox, beta-blocker, ACE inhibitor,     Plavix, and  aspirin.  If his cardiac markers return positive, we will     reinitiate head rolling.  4. Given his coronary artery disease with elevated triglycerides and low     HDL, would likely benefit from a statin niacin combination versus     gemfibrozil.  5. If cardiac catheterization is negative, would consider GI workup and/or     having psychiatry service give some input regarding help with management     of his anger and hostility issues.  6. We will continue him on proton pump inhibitor and hold his NSAIDs.                                               Glori Bickers, M.D. [facility]    DB/MEDQ  D:  02/11/2003  T:  02/12/2003  Job:  [id number]   cc:   Monico Blitz  [place]  [place] [id number]  Fax: [phone]   [name], M.D. [facility]

## 2011-03-27 NOTE — Cardiovascular Report (Signed)
NAME:  [name] [name], [name] [name] NO.:  [id number]   MEDICAL RECORD NO.:  [id number]          PATIENT TYPE:  INP   LOCATION:  4711                         FACILITY:  [facility]   PHYSICIAN:  [name], M.D. LHCDATE OF BIRTH:  Feb 14, 1956   DATE OF PROCEDURE:  01/13/2006  DATE OF DISCHARGE:                              CARDIAC CATHETERIZATION   CARDIOLOGIST:  Dr. Domenic Polite   REQUESTING CARDIOLOGIST:  Dr. Dannielle Burn   PRIMARY CARE PHYSICIAN:  Dr. Jeanie Cooks   INDICATIONS:  Mr. Kuenzi is a 55 year old male with known coronary disease  status post previous drug-eluting stent placement to the proximal and mid  left anterior descending most recently back in 2004.  Most recent  angiography in January of 2006 revealed grossly patent stents with a  subbranch stenosis in the first obtuse marginal that was 70% and felt to be  best managed medically at that time.  Mr. Loeper has had recurrent chest  pain on a number of occasions and is now most recently admitted to [facility] with recurrent chest pain that was felt to be fairly  consistent with angina.  He ruled out for myocardial infarction and is  referred now for cardiac catheterization to clearly define the coronary  anatomy and assess for any potential revascularization strategies.  The  risks and benefits were explained to the patient in advance and informed  consent was obtained prior to proceeding.   PROCEDURE PERFORMED:  1.  Left heart catheterization.  2.  Selective coronary angiography.  3.  Left renal artery.   ACCESS AND EQUIPMENT:  The area about the right femoral artery was  anesthetized with 1% lidocaine and a 6-French sheath was placed in the right  femoral artery via the modified Seldinger technique.  Standard preformed 6-  [place] and JR4 catheters were used for selective coronary angiography  and an angled pigtail catheter was used for left heart catheterization, left  ventriculography.   All exchanges were made over a wire and the patient  tolerated procedure well without immediate complications.  Total of 125 mL  of Omnipaque were used.   HEMODYNAMICS:  Aorta is 113/77 mmHg.  Left ventricle is 113/12 mmHg.   ANGIOGRAPHIC FINDINGS:  1.  Left main coronary artery is free of significant flow-limiting coronary      atherosclerosis and gives rise to the left anterior descending,      circumflex, and a very small ramus intermedius.  2.  The left anterior descending is a medium caliber vessel with three      diagonal branches, the first of which is the largest.  There is also a      relatively large proximal septal perforator.  Two stent sites are      visualized in tandem fashion, the second stent origin being essentially      at the takeoff of the first diagonal branch.  There are actually two      septal perforators, the first of which is the largest and the second of      which is around the mid portion of the  second stent.  The stent sites      are grossly patent, although there is approximately 20% in-stent      restenosis predominantly involving the first stent site.  The second      septal perforator fills with TIMI 2 flow likely jailed somewhat by the      second stent.  The remainder of the left anterior descending has      approximately 40% stenosis after the second stent site and no other      major flow-limiting stenoses.  3.  There is a very small ramus intermedius noted.  4.  The circumflex coronary artery is medium to large in caliber.  There is      a large bifurcating first obtuse marginal and smaller second obtuse      marginal in the distal segment.  There is a medium sized superior      subbranch of the first obtuse marginal that has an ostial 85% stenosis      that looks to be somewhat more significant compared to prior films from      2006.  5.  The right coronary artery is a medium to large caliber dominant vessel.      There are minor luminal  irregularities including 20% proximal stenosis      as well as 20% stenosis in a large posterolateral branch.   Left ventriculography was performed in the RAO projection, reveals an  ejection fraction approximately 60% with no anterior inferior wall motion  abnormality and trace mitral regurgitation.   DIAGNOSIS:  1.  Coronary artery disease as outlined.  The stents within the left      anterior descending are grossly patent with approximately 20% in-stent      restenosis predominantly involving the more proximal stent.  There is      TIMI 2 in the second septal perforator which is likely due to some      jailing from the second left anterior descending stent, although most      likely this is not clinically significant.  There is, however, an 85%      ostial stenosis in a medium sized superior subbranch of a large first      obtuse marginal branch.  This looks to be somewhat more significant      compared to the prior films in 2006 and possibly related to recurrent      chest pain.  2.  Left ventricular ejection fraction approximately 60% with trace mitral      regurgitation and left ventricular end-diastolic pressure of 12 mmHg.   DISCUSSION:  I reviewed the results with the patient and also reviewed the  films with Dr. Albertine Patricia.  The subbranch stenosis within the first obtuse  marginal looks to be somewhat more significant compared to the prior films.  It is not certain that is clearly related to the patient's recurrent chest  pain; however, it is possible.  If related then a strategy of medical  therapy has not been effective.  Plan at this point after discussion is to  proceed with percutaneous intervention most likely with cutting balloon  angioplasty rather than stent placement to address the first obtuse marginal  stenosis in an attempt to help with symptomatology.  If it is not related to  symptoms than it would be much more likely that the patient's chest pain is noncardiac  from the perspective of coronary insufficiency.  I reviewed this  plan with the patient and he  agrees to proceed.           ______________________________  [name], M.D. [facility]     SGM/MEDQ  D:  01/13/2006  T:  01/14/2006  Job:  [id number]   cc:   Duke Salvia, M.D.  Fax: [phone]

## 2011-03-27 NOTE — Discharge Summary (Signed)
NAME:  [name] [name], [name] [name]                         ACCOUNT NO.:  [id number]   MEDICAL RECORD NO.:  [id number]                   PATIENT TYPE:  INP   LOCATION:  3706                                 FACILITY:  [facility]   PHYSICIAN:  Deboraha Sprang, M.D. [facility]           DATE OF BIRTH:  05-12-1956   DATE OF ADMISSION:  02/10/2003  DATE OF DISCHARGE:  02/11/2003                                 DISCHARGE SUMMARY   DISCHARGE DIAGNOSES:  1. Non-cardiac chest pain.  2. Non-steroidal, anti-inflammatory drug/aspirin induced     gastritis/duodenitis; a)  Chest pain probably related to this.  3. Coronary artery disease; a)  Catheterization February 2004 with an     ejection fraction of 50%, left anterior descending with 90% mid lesion,     first diagonal 50 to 75%, left circumflex okay and right coronary artery     with 30% proximal lesion; b)  Percutaneous transluminal coronary     angioplasty and stenting of mid left anterior descending lesion with a     Cypher stent in February 2004; c)  Re-look catheterization January 18, 2003     without any significant increased stenosis; d)  Readmission January 28, 2003 for chest pain with negative treadmill Cardiolite.  4. Dyslipidemia with elevated triglycerides and low HDL.  5. Metabolic syndrome.  6. Gastroesophageal reflux disease.  7. History of epistaxis with Integrelin.  8. Depression/hostility.  9. Chronic neck pain.  10.      Hypertension.  11.      Obesity.  12.      History of transaminitis - probable hepatic steatosis.  13.      Diabetes.   PROCEDURES PERFORMED THIS ADMISSION:  Esophagogastroduodenoscopy by Dr.  Earlean Shawl.   PENDING TESTS AT THE TIME OF THIS DICTATION:  1. CLOtest.  2. Abdominal ultrasound.   HOSPITAL COURSE:  Please see the dictated admission history and physical by  Dr. Glori Bickers for complete details.  Briefly, this 55 year old obese  white male with a history of diabetes, hypertension, hyperlipidemia and CAD  presented to the emergency room with recurrent chest pain.  As noted above,  he has had several admissions with chest pain with a negative re-look  catheterization in March 2004.  He was admitted and placed on Lovenox, beta  blockers, ACE inhibitor, Plavix and aspirin.  He ruled out for a MI by  enzymes.  It was felt that the patient possibly could need re-  catheterization versus gastrointestinal evaluation.  Dr. Olevia Perches saw the  patient on April 5 and felt the patient's symptoms were probably not  suggestive of ischemia.  He reviewed the patient's case and films and  decided on April 6 to consult the gastroenterology service.  The patient had  seen Dr. Earlean Shawl in the past and he saw the patient on February 14, 2003.  Esophagogastroduodenoscopy was performed by Dr. Earlean Shawl on February 14, 2003 and  this revealed gastritis and duodenitis without hemorrhage.  It was felt that  this was probably NSAID and aspirin induced.  Dr. Earlean Shawl also performed a  CLOtest; this is pending at the time of this dictation.  He recommended  increasing the patient's Nexium to 40 mg twice daily.  He also recommended  an alternative to NSAID's for his low back pain.  Dr. Olevia Perches recommended  Darvocet.  Dr. Earlean Shawl also sent the patient for an abdominal ultrasound to  rule out gallbladder disease and this is pending at this time.  Dr. Olevia Perches  felt the patient was ready for discharge to home.  He suggested that his  aspirin be decreased to 81 mg per day.  He also recommended stopping Relafen  and starting Darvocet.  The patient will follow-up with the physician  assistant for Dr. Domenic Polite in two weeks in [place].   LABORATORY DATA:  White blood cell count 4,700, hemoglobin 15.2, hematocrit  43.8, platelet count 150,000.  INR 1.0.  Sodium 138, potassium 3.7, chloride  100, C02 28, glucose 111, BUN 8, creatinine 1.1.  LFTs were essentially  normal.  Albumin low at 3.3.  Calcium 9.0.  Hemoglobin A1c was elevated at  8.9.  Cardiac  enzymes were negative.   Admission chest x-ray shows no acute air space disease.   DISCHARGE MEDICATIONS:  1. Plavix 75 mg daily.  2. Glucophage 100 mg twice daily.  3. Amaryl 4 mg daily.  4. Lexapro 20 mg daily.  5. Depakote 250 mg t.i.d.  6. Altace 2.5 mg daily.  7. Toprol XL 25 mg daily.  8. Insulin.  9. Nexium 40 mg twice daily (please note this was increased this admission).  10.      Nitroglycerin p.r.n. chest pain.  11.      Wellbutrin.  12.      Enteric-coated aspirin 81 mg daily (please note that this was     decreased this admission).  13.      Darvocet-N 100 p.r.n. pain (new medication.   PAIN MANAGEMENT:  The patient is to use Tylenol or Darvocet as needed for  pain.   ACTIVITY:  As tolerated.   DIET:  Low fat, low sodium, diabetic diet.   FOLLOW UP:  1. The patient will follow-up with the PA for Dr. Domenic Polite on April 27 at 1     p.m. in [place].  2.     He is to call Dr. Brigitte Pulse, his primary care physician, for an appointment     within the next two to three weeks.  3. He should contact Dr. Earlean Shawl to follow-up in the next couple of weeks     for; a)  CLOtest that is pending; and b)  The abdominal ultrasound that     is pending.     Richardson Dopp, P.A.                        Deboraha Sprang, M.D. [facility]    SW/MEDQ  D:  02/14/2003  T:  02/14/2003  Job:  [id number]   cc:   [name], M.D. [facility]   Mayme Genta, M.D.  [place]  [place] [address]  Fax: [phone]   Russella Dar, M.D.  [place], [place]

## 2011-08-07 LAB — CBC
HCT: 42.7
MCHC: 34.6
MCV: 92.8
RBC: 4.6

## 2011-08-07 LAB — PROTIME-INR: INR: 1.1

## 2011-08-07 LAB — BASIC METABOLIC PANEL
CO2: 29
Chloride: 105
Creatinine, Ser: 1.14
GFR calc Af Amer: >60

## 2011-08-07 LAB — LIPID PANEL
LDL Cholesterol: 88
Triglycerides: 238 — ABNORMAL HIGH

## 2011-08-07 LAB — APTT: aPTT: 32

## 2011-08-12 DIAGNOSIS — M5430 Sciatica, unspecified side: Secondary | ICD-10-CM | POA: Insufficient documentation

## 2011-10-13 DIAGNOSIS — E1142 Type 2 diabetes mellitus with diabetic polyneuropathy: Secondary | ICD-10-CM | POA: Insufficient documentation

## 2011-10-13 DIAGNOSIS — M1712 Unilateral primary osteoarthritis, left knee: Secondary | ICD-10-CM | POA: Insufficient documentation

## 2012-02-08 HISTORY — PX: KNEE SURGERY: SHX244

## 2012-03-25 ENCOUNTER — Emergency Department (HOSPITAL_COMMUNITY): Payer: Medicare Other

## 2012-03-25 ENCOUNTER — Emergency Department (HOSPITAL_COMMUNITY)
Admission: EM | Admit: 2012-03-25 | Discharge: 2012-03-25 | Disposition: A | Discharge status: Home / Self Care | Payer: Medicare Other | Attending: Emergency Medicine | Admitting: Emergency Medicine

## 2012-03-25 ENCOUNTER — Encounter (HOSPITAL_COMMUNITY): Payer: Self-pay | Admitting: *Deleted

## 2012-03-25 DIAGNOSIS — R413 Other amnesia: Secondary | ICD-10-CM | POA: Insufficient documentation

## 2012-03-25 DIAGNOSIS — R61 Generalized hyperhidrosis: Secondary | ICD-10-CM | POA: Insufficient documentation

## 2012-03-25 DIAGNOSIS — E119 Type 2 diabetes mellitus without complications: Secondary | ICD-10-CM | POA: Insufficient documentation

## 2012-03-25 DIAGNOSIS — Z794 Long term (current) use of insulin: Secondary | ICD-10-CM | POA: Insufficient documentation

## 2012-03-25 DIAGNOSIS — R079 Chest pain, unspecified: Secondary | ICD-10-CM | POA: Insufficient documentation

## 2012-03-25 DIAGNOSIS — R110 Nausea: Secondary | ICD-10-CM | POA: Insufficient documentation

## 2012-03-25 DIAGNOSIS — J45909 Unspecified asthma, uncomplicated: Secondary | ICD-10-CM | POA: Insufficient documentation

## 2012-03-25 DIAGNOSIS — I2510 Atherosclerotic heart disease of native coronary artery without angina pectoris: Secondary | ICD-10-CM | POA: Insufficient documentation

## 2012-03-25 DIAGNOSIS — R42 Dizziness and giddiness: Secondary | ICD-10-CM | POA: Insufficient documentation

## 2012-03-25 DIAGNOSIS — R55 Syncope and collapse: Secondary | ICD-10-CM | POA: Insufficient documentation

## 2012-03-25 DIAGNOSIS — I10 Essential (primary) hypertension: Secondary | ICD-10-CM | POA: Insufficient documentation

## 2012-03-25 DIAGNOSIS — Z79899 Other long term (current) drug therapy: Secondary | ICD-10-CM | POA: Insufficient documentation

## 2012-03-25 LAB — CBC
MCH: 31.8 pg (ref 26.0–34.0)
MCHC: 35.9 g/dL (ref 30.0–36.0)
MCV: 88.6 fL (ref 78.0–100.0)
Platelets: 126 10*3/uL — ABNORMAL LOW (ref 150–400)
RDW: 12.7 % (ref 11.5–15.5)
WBC: 3.8 10*3/uL — ABNORMAL LOW (ref 4.0–10.5)

## 2012-03-25 LAB — TROPONIN I: Troponin I: <0.3 ng/mL (ref <0.30)

## 2012-03-25 LAB — BASIC METABOLIC PANEL
Calcium: 9.8 mg/dL (ref 8.4–10.5)
Chloride: 103 mEq/L (ref 96–112)
Creatinine, Ser: 1.11 mg/dL (ref 0.50–1.35)
GFR calc Af Amer: 86 mL/min — ABNORMAL LOW (ref >90)
GFR calc non Af Amer: 74 mL/min — ABNORMAL LOW (ref >90)

## 2012-03-25 MED ORDER — ONDANSETRON HCL 4 MG/2ML IJ SOLN
4.0000 mg | Freq: Once | INTRAMUSCULAR | Status: AC
  Administered 2012-03-25: 4 mg via INTRAVENOUS
  Filled 2012-03-25: qty 2

## 2012-03-25 MED ORDER — ASPIRIN 81 MG PO CHEW
324.0000 mg | CHEWABLE_TABLET | Freq: Once | ORAL | Status: AC
  Administered 2012-03-25: 324 mg via ORAL
  Filled 2012-03-25: qty 4

## 2012-03-25 MED ORDER — ONDANSETRON HCL 4 MG PO TABS: 4.0000 mg | ORAL_TABLET | Freq: Four times a day (QID) | ORAL | Status: AC

## 2012-03-25 MED ORDER — SODIUM CHLORIDE 0.9 % IV SOLN
Freq: Once | INTRAVENOUS | Status: AC
  Administered 2012-03-25: 02:00:00 via INTRAVENOUS

## 2012-03-25 NOTE — ED Notes (Signed)
Pt reports intermittent episodes of left sided chest pain with asso nausea starting earlier today, tonight pt felt like he was going to pass out

## 2012-03-25 NOTE — Discharge Instructions (Signed)
Your lab work today was normal. Your heart numbers, EKG and chest x-ray were normal. The CT of your head did not show any acute process. Continue your current medicines. Use the nausea medicine as needed. Continue to look for a primary care physician in the area.

## 2012-03-25 NOTE — ED Provider Notes (Signed)
History     CSN: [id number]  Arrival date & time 03/25/12  7939   First MD Initiated Contact with Patient 03/25/12 0111      Chief Complaint  Patient presents with  . Chest Pain    (Consider location/radiation/quality/duration/timing/severity/associated sxs/prior treatment) HPI [name] [name] is a 56 y.o. male with a h/o diabetes, hypertension, asthma, coronary artery disease status post stent placement, TIA, who presents to the Emergency Department complaining of nausea, sweating and chest pain that has been intermittent all day. In addition it was associated with two episodes of forgetfulness this morning and an episode of near syncope. Patient denies any recent illness. Wife relates that he could not remember his date of birth this morning. Addition there was an episode where he could not follow the conversation. He has a history of TIAs in the past.Chest pain was left sided chest pain and felt like pressure. He has taken no extra medicines for his symptoms.   No PCP Cardiology formerly [place]   Past Medical History  Diagnosis Date  . Coronary artery disease   . Diabetes mellitus   . Asthma   . Hypertension     Past Surgical History  Procedure Date  . Neck surgery   . Carpal tunnel release   . Coronary stent placement   . Shoulder arthroscopy     No family history on file.  History  Substance Use Topics  . Smoking status: Never Smoker   . Smokeless tobacco: Not on file  . Alcohol Use: No      Review of Systems  Constitutional: Negative for fever.       10 Systems reviewed and are negative for acute change except as noted in the HPI.  HENT: Negative for congestion.   Eyes: Negative for discharge and redness.  Respiratory: Negative for cough and shortness of breath.   Cardiovascular: Positive for chest pain.  Gastrointestinal: Positive for nausea. Negative for vomiting and abdominal pain.  Musculoskeletal: Negative for back pain.  Skin: Negative for rash.    Neurological: Positive for light-headedness. Negative for syncope, numbness and headaches.       Transient memory loss  Psychiatric/Behavioral:       No behavior change.    Allergies  Review of patient's allergies indicates no known allergies.  Home Medications   Current Outpatient Rx  Name Route Sig Dispense Refill  . AMLODIPINE BESYLATE 5 MG PO TABS Oral Take 5 mg by mouth daily.    [name] CITALOPRAM HYDROBROMIDE 20 MG PO TABS Oral Take 20 mg by mouth daily.    . FENOFIBRATE 145 MG PO TABS Oral Take 145 mg by mouth daily.    [name] FLUTICASONE-SALMETEROL 250-50 MCG/DOSE IN AEPB Inhalation Inhale 1 puff into the lungs every 12 (twelve) hours.    [name] GABAPENTIN 300 MG PO CAPS Oral Take 300 mg by mouth 3 (three) times daily.    . INSULIN ASPART 100 UNIT/ML [place] SOLN Subcutaneous Inject into the skin 3 (three) times daily before meals.    . INSULIN GLARGINE 100 UNIT/ML [place] SOLN Subcutaneous Inject into the skin 2 (two) times daily. 60 u am, 60 units pm    . METFORMIN HCL 1000 MG PO TABS Oral Take 1,000 mg by mouth daily with breakfast.    . METOPROLOL SUCCINATE ER 100 MG PO TB24 Oral Take 100 mg by mouth daily. Take with or immediately following a meal.    . OMEPRAZOLE 20 MG PO CPDR Oral Take 20 mg by mouth  2 (two) times daily.    [name] RAMIPRIL 5 MG PO TABS Oral Take 5 mg by mouth daily.      There were no vitals taken for this visit.  Physical Exam  Nursing note and vitals reviewed. Constitutional: He is oriented to person, place, and time.       Awake, alert, nontoxic appearance.  HENT:  Head: Atraumatic.  Eyes: Right eye exhibits no discharge. Left eye exhibits no discharge.  Neck: Neck supple.  Cardiovascular: Normal rate, normal heart sounds and intact distal pulses.   Pulmonary/Chest: Effort normal and breath sounds normal. He exhibits no tenderness.  Abdominal: Soft. There is no tenderness. There is no rebound.  Musculoskeletal: He exhibits no tenderness.       Baseline ROM, no obvious  new focal weakness.  Neurological: He is alert and oriented to person, place, and time. No cranial nerve deficit. Coordination normal.       Mental status and motor strength appears baseline for patient and situation.  Skin: No rash noted.  Psychiatric: He has a normal mood and affect.    ED Course  Procedures (including critical care time) Results for orders placed during the hospital encounter of 03/25/12  CBC      Component Value Range   WBC 3.8 (*) 4.0 - 10.5 (K/uL)   RBC 5.00  4.22 - 5.81 (MIL/uL)   Hemoglobin 15.9  13.0 - 17.0 (g/dL)   HCT 44.3  39.0 - 52.0 (%)   MCV 88.6  78.0 - 100.0 (fL)   MCH 31.8  26.0 - 34.0 (pg)   MCHC 35.9  30.0 - 36.0 (g/dL)   RDW 12.7  11.5 - 15.5 (%)   Platelets 126 (*) 150 - 400 (K/uL)  BASIC METABOLIC PANEL      Component Value Range   Sodium 138  135 - 145 (mEq/L)   Potassium 4.1  3.5 - 5.1 (mEq/L)   Chloride 103  96 - 112 (mEq/L)   CO2 24  19 - 32 (mEq/L)   Glucose, Bld 223 (*) 70 - 99 (mg/dL)   BUN 20  6 - 23 (mg/dL)   Creatinine, Ser 1.11  0.50 - 1.35 (mg/dL)   Calcium 9.8  8.4 - 10.5 (mg/dL)   GFR calc non Af Amer 74 (*) >90 (mL/min)   GFR calc Af Amer 86 (*) >90 (mL/min)  TROPONIN I      Component Value Range   Troponin I <0.30  <0.30 (ng/mL)   Ct Head Wo Contrast  03/25/2012  *RADIOLOGY REPORT*  Clinical Data: Chest pain.  The patient fell like he was going to pass out.  CT HEAD WITHOUT CONTRAST  Technique:  Contiguous axial images were obtained from the base of the skull through the vertex without contrast.  Comparison: 08/14/2009  Findings: Mild cerebral atrophy.  No ventricular dilatation.  No mass effect or midline shift.  No abnormal extra-axial fluid collections.  Gray-white matter junctions are distinct.  Basal cisterns are not effaced.  No evidence of acute intracranial hemorrhage.  No depressed skull fractures.  Visualized paranasal sinuses and mastoid air cells are not opacified.  Vascular calcifications.  Stable appearance  since previous study.  IMPRESSION: No acute intracranial abnormalities.  Original Report Authenticated By: Neale Burly, M.D.   Chest Portable 1 View  03/25/2012  *RADIOLOGY REPORT*  Clinical Data: Pain.  Asthma.  Coronary artery disease. Hypertension.  Diabetes.  PORTABLE CHEST - 1 VIEW  Comparison: 05/23/2006  Findings: Thoracic spondylosis noted. Cardiac and  mediastinal contours appear normal.  The lungs appear clear.  No pleural effusion is identified.  IMPRESSION: Thoracic spondylosis.   Otherwise, no significant abnormality identified.  Original Report Authenticated By: Carron Curie, M.D.     Date: 03/25/2012  0102  Rate:91  Rhythm: normal sinus rhythm  QRS Axis: normal  Intervals: normal  ST/T Wave abnormalities: normal  Conduction Disutrbances: none  Narrative Interpretation: unremarkable; no change c/w 06/14/2008   MDM  Patient with intermittent left-sided chest pain and nausea since early yesterday morning. Laboratory data is unremarkable. Troponin is negative, EKG is normal, chest x-ray is normal. CT head is without acute findings. Patient has remained pain free and without nausea since arrival. He has been given IV fluids, aspirin, Zofran. Reviewed results with the patient and his wife. They voiced understanding.Pt stable in ED with no significant deterioration in condition.The patient appears reasonably screened and/or stabilized for discharge and I doubt any other medical condition or other [facility] requiring further screening, evaluation, or treatment in the ED at this time prior to discharge.  MDM Reviewed: nursing note and vitals Interpretation: labs, ECG and x-ray           Gypsy Balsam. Olin Hauser, MD 03/25/12 [phone]

## 2012-06-23 ENCOUNTER — Other Ambulatory Visit: Payer: Self-pay

## 2012-06-23 DIAGNOSIS — R079 Chest pain, unspecified: Secondary | ICD-10-CM

## 2012-06-24 ENCOUNTER — Encounter: Payer: Self-pay | Admitting: Physician Assistant

## 2012-06-24 DIAGNOSIS — R079 Chest pain, unspecified: Secondary | ICD-10-CM

## 2012-07-04 ENCOUNTER — Encounter: Payer: Self-pay | Admitting: Cardiology

## 2012-07-04 ENCOUNTER — Ambulatory Visit (INDEPENDENT_AMBULATORY_CARE_PROVIDER_SITE_OTHER): Payer: Medicare Other | Admitting: Cardiology

## 2012-07-04 VITALS — BP 132/82 | HR 79 | Ht 69.0 in | Wt 237.0 lb

## 2012-07-04 DIAGNOSIS — S8990XA Unspecified injury of unspecified lower leg, initial encounter: Secondary | ICD-10-CM

## 2012-07-04 DIAGNOSIS — E119 Type 2 diabetes mellitus without complications: Secondary | ICD-10-CM | POA: Insufficient documentation

## 2012-07-04 DIAGNOSIS — G4700 Insomnia, unspecified: Secondary | ICD-10-CM | POA: Insufficient documentation

## 2012-07-04 DIAGNOSIS — G4733 Obstructive sleep apnea (adult) (pediatric): Secondary | ICD-10-CM | POA: Insufficient documentation

## 2012-07-04 DIAGNOSIS — I10 Essential (primary) hypertension: Secondary | ICD-10-CM

## 2012-07-04 DIAGNOSIS — S99919A Unspecified injury of unspecified ankle, initial encounter: Secondary | ICD-10-CM

## 2012-07-04 DIAGNOSIS — I2510 Atherosclerotic heart disease of native coronary artery without angina pectoris: Secondary | ICD-10-CM

## 2012-07-04 DIAGNOSIS — S86009A Unspecified injury of unspecified Achilles tendon, initial encounter: Secondary | ICD-10-CM

## 2012-07-04 MED ORDER — TRAZODONE HCL 100 MG PO TABS: 100.0000 mg | ORAL_TABLET | Freq: Every day | ORAL | Status: DC

## 2012-07-04 NOTE — Assessment & Plan Note (Signed)
Blood pressure well controlled no change in medical therapy.

## 2012-07-04 NOTE — Progress Notes (Signed)
Salli Real, MD, [facility] ABIM Board Certified in Adult Cardiovascular Medicine,Internal Medicine and Critical Care Medicine    CC:    Followup patient multiple cardiac risk factors with post hospital visit for chest pain                                                                              HPI:        Patient has a history of coronary artery disease with multiple prior interventions. Catheterization last year however showed nonobstructive disease with patent stents. During this admission an echocardiogram was performed which showed normal LV systolic function. The patient currently denies any symptoms of chest pain since his hospitalization. He was also ruled out for myocardial infarction. No further ischemia testing was performed. His current limited in his activities because of her recent Achilles tendon injury. The patient denies any orthopnea PND palpitations presyncope or syncope. Has chronic back pain secondary to herniated disc for which he takes Neurontin. Reported the patient snores loud nights and may have apneic episodes. He has never been evaluated for obstructive sleep apnea. He reports significant insomnia.   PMH: reviewed and listed in Problem List in Electronic Records (and see below) Past Medical History  Diagnosis Date  . Coronary artery disease   . Diabetes mellitus   . Asthma   . Hypertension    Past Surgical History  Procedure Date  . Neck surgery   . Carpal tunnel release   . Coronary stent placement   . Shoulder arthroscopy     Allergies/SH/FHX : available in Electronic Records for review  No Known Allergies History   Social History  . Marital Status: Married    Spouse Name: N/A    Number of Children: N/A  . Years of Education: N/A   Occupational History  . Not on file.   Social History Main Topics  . Smoking status: Never Smoker   . Smokeless tobacco: Not on file  . Alcohol Use: No  . Drug Use:   . Sexually Active:    Other Topics  Concern  . Not on file   Social History Narrative  . No narrative on file   No family history on file.  Medications: Current Outpatient Prescriptions  Medication Sig Dispense Refill  . atorvastatin (LIPITOR) 80 MG tablet 1 tab at night      . citalopram (CELEXA) 20 MG tablet Take 20 mg by mouth daily.      . fenofibrate (TRICOR) 145 MG tablet Take 145 mg by mouth daily.      . Fluticasone-Salmeterol (ADVAIR) 250-50 MCG/DOSE AEPB Inhale 1 puff into the lungs every 12 (twelve) hours.      . gabapentin (NEURONTIN) 300 MG capsule Take 300 mg by mouth 3 (three) times daily.      . insulin aspart (NOVOLOG) 100 UNIT/ML injection Inject into the skin 3 (three) times daily before meals.      . insulin glargine (LANTUS) 100 UNIT/ML injection Inject into the skin 2 (two) times daily. 60 u am, 60 units pm      . meloxicam (MOBIC) 15 MG tablet 1 tab daily      . metFORMIN (GLUCOPHAGE) 1000 MG tablet  Take 1,000 mg by mouth daily with breakfast. And 570m in the evening      . nitroGLYCERIN (NITROSTAT) 0.4 MG SL tablet Place 0.4 mg under the tongue every 5 (five) minutes as needed.      .Marland Kitchenomeprazole (PRILOSEC) 20 MG capsule Take 20 mg by mouth 2 (two) times daily.      . ramipril (ALTACE) 5 MG tablet Take 5 mg by mouth daily.      . traZODone (DESYREL) 100 MG tablet Take 1 tablet (100 mg total) by mouth at bedtime.  30 tablet  1    ROS: No nausea or vomiting. No fever or chills.No melena or hematochezia.No bleeding.No claudication  Physical Exam: BP 132/82  Pulse 79  Ht 5' 9"  (1.753 m)  Wt 237 lb (107.502 kg)  BMI 35.00 kg/m2 General: Well-nourished white male in no apparent distress Neck: Short neck. Normal carotid upstroke no carotid bruits. No thyromegaly nonnodular thyroid. JVP 6-7 cm Lungs: Clear breath sounds bilaterally without wheezing Cardiac: Regular rate and rhythm with normal S1-S2 and no murmur rubs or gallops Vascular: No edema. Normal distal pulses Skin: Warm and  dry Physcologic: Normal affect  12lead ECG: Normal sinus rhythm with normal tracing otherwise nonspecific ST segment changes Limited bedside ECHO:N/A No images are attached to the encounter.   I reviewed and summarized the old records. I reviewed ECG and prior blood work.  Assessment and Plan  CAD, NATIVE VESSEL [facility]. Symptoms were atypical and the patient was ruled out for myocardial infarction. Patient is currently doing well. He reports no recurrent chest pain. We'll continue current medical regimen.  HYPERTENSION, UNSPECIFIED Blood pressure well controlled no change in medical therapy.  Insomnia I've given the patient a prescription of trazodone. I suspect his insomnia is multifactorial but he reports ruminating thoughts and his sleep pattern may be disturbed followed by the use of citalopram as well as obstructive sleep apnea  Obstructive sleep apnea Patient was referred to Dr. WRedmond Pullingduring this visit for evaluation of obstructive sleep apnea    Patient Active Problem List  Diagnosis  . HYPERLIPIDEMIA-MIXED  . OBESITY-MORBID (>100')  . HYPERTENSION, UNSPECIFIED  . CAD, NATIVE VESSEL  . Insomnia  . Obstructive sleep apnea  . Diabetes mellitus  . Achilles tendon injury

## 2012-07-04 NOTE — Assessment & Plan Note (Signed)
Patient was referred to Dr. Redmond Pulling during this visit for evaluation of obstructive sleep apnea

## 2012-07-04 NOTE — Assessment & Plan Note (Signed)
The patient was recently seen in August at [facility] and was admitted for substernal chest pain. Symptoms were atypical and the patient was ruled out for myocardial infarction. Patient is currently doing well. He reports no recurrent chest pain. We'll continue current medical regimen.

## 2012-07-04 NOTE — Patient Instructions (Signed)
   Trazodone 111m every evening for sleep as needed  Make appointment with Dr. WRedmond Pullingfor c-pap management Your physician wants you to follow up in: 6 months.  You will receive a reminder letter in the mail one-two months in advance.  If you don't receive a letter, please call our office to schedule the follow up appointment

## 2012-07-04 NOTE — Assessment & Plan Note (Signed)
I've given the patient a prescription of trazodone. I suspect his insomnia is multifactorial but he reports ruminating thoughts and his sleep pattern may be disturbed followed by the use of citalopram as well as obstructive sleep apnea

## 2012-07-17 ENCOUNTER — Inpatient Hospital Stay (HOSPITAL_COMMUNITY)
Admission: EM | Admit: 2012-07-17 | Discharge: 2012-07-18 | Disposition: A | Discharge status: Home / Self Care | Payer: Medicare Other | Source: Home / Self Care | Attending: Internal Medicine | Admitting: Internal Medicine

## 2012-07-17 ENCOUNTER — Emergency Department (HOSPITAL_COMMUNITY): Payer: Medicare Other

## 2012-07-17 ENCOUNTER — Encounter (HOSPITAL_COMMUNITY): Payer: Self-pay

## 2012-07-17 DIAGNOSIS — Z79899 Other long term (current) drug therapy: Secondary | ICD-10-CM

## 2012-07-17 DIAGNOSIS — G4700 Insomnia, unspecified: Secondary | ICD-10-CM | POA: Diagnosis present

## 2012-07-17 DIAGNOSIS — E669 Obesity, unspecified: Secondary | ICD-10-CM | POA: Diagnosis present

## 2012-07-17 DIAGNOSIS — I2510 Atherosclerotic heart disease of native coronary artery without angina pectoris: Secondary | ICD-10-CM | POA: Diagnosis present

## 2012-07-17 DIAGNOSIS — J45909 Unspecified asthma, uncomplicated: Secondary | ICD-10-CM | POA: Diagnosis present

## 2012-07-17 DIAGNOSIS — I10 Essential (primary) hypertension: Secondary | ICD-10-CM | POA: Diagnosis present

## 2012-07-17 DIAGNOSIS — E782 Mixed hyperlipidemia: Secondary | ICD-10-CM | POA: Diagnosis present

## 2012-07-17 DIAGNOSIS — T82897A Other specified complication of cardiac prosthetic devices, implants and grafts, initial encounter: Principal | ICD-10-CM | POA: Diagnosis present

## 2012-07-17 DIAGNOSIS — Z7982 Long term (current) use of aspirin: Secondary | ICD-10-CM

## 2012-07-17 DIAGNOSIS — Z6834 Body mass index (BMI) 34.0-34.9, adult: Secondary | ICD-10-CM

## 2012-07-17 DIAGNOSIS — Y92009 Unspecified place in unspecified non-institutional (private) residence as the place of occurrence of the external cause: Secondary | ICD-10-CM

## 2012-07-17 DIAGNOSIS — Z794 Long term (current) use of insulin: Secondary | ICD-10-CM

## 2012-07-17 DIAGNOSIS — E785 Hyperlipidemia, unspecified: Secondary | ICD-10-CM | POA: Diagnosis present

## 2012-07-17 DIAGNOSIS — S86009A Unspecified injury of unspecified Achilles tendon, initial encounter: Secondary | ICD-10-CM

## 2012-07-17 DIAGNOSIS — R0789 Other chest pain: Secondary | ICD-10-CM | POA: Diagnosis present

## 2012-07-17 DIAGNOSIS — Z6835 Body mass index (BMI) 35.0-35.9, adult: Secondary | ICD-10-CM

## 2012-07-17 DIAGNOSIS — Y840 Cardiac catheterization as the cause of abnormal reaction of the patient, or of later complication, without mention of misadventure at the time of the procedure: Secondary | ICD-10-CM | POA: Diagnosis present

## 2012-07-17 DIAGNOSIS — G4733 Obstructive sleep apnea (adult) (pediatric): Secondary | ICD-10-CM | POA: Diagnosis present

## 2012-07-17 DIAGNOSIS — I200 Unstable angina: Secondary | ICD-10-CM

## 2012-07-17 DIAGNOSIS — Z9861 Coronary angioplasty status: Secondary | ICD-10-CM

## 2012-07-17 DIAGNOSIS — E119 Type 2 diabetes mellitus without complications: Secondary | ICD-10-CM | POA: Diagnosis present

## 2012-07-17 DIAGNOSIS — R55 Syncope and collapse: Secondary | ICD-10-CM | POA: Diagnosis present

## 2012-07-17 DIAGNOSIS — R079 Chest pain, unspecified: Secondary | ICD-10-CM | POA: Diagnosis present

## 2012-07-17 LAB — GLUCOSE, CAPILLARY: Glucose-Capillary: 128 mg/dL — ABNORMAL HIGH (ref 70–99)

## 2012-07-17 LAB — COMPREHENSIVE METABOLIC PANEL
AST: 27 U/L (ref 0–37)
Albumin: 3.4 g/dL — ABNORMAL LOW (ref 3.5–5.2)
BUN: 15 mg/dL (ref 6–23)
CO2: 23 mEq/L (ref 19–32)
Calcium: 9.4 mg/dL (ref 8.4–10.5)
Creatinine, Ser: 1.05 mg/dL (ref 0.50–1.35)
GFR calc non Af Amer: 79 mL/min — ABNORMAL LOW (ref >90)

## 2012-07-17 LAB — CBC WITH DIFFERENTIAL/PLATELET
Basophils Absolute: 0 10*3/uL (ref 0.0–0.1)
Basophils Relative: 1 % (ref 0–1)
Eosinophils Relative: 3 % (ref 0–5)
HCT: 37.1 % — ABNORMAL LOW (ref 39.0–52.0)
MCHC: 36.4 g/dL — ABNORMAL HIGH (ref 30.0–36.0)
MCV: 86.9 fL (ref 78.0–100.0)
Monocytes Absolute: 0.4 10*3/uL (ref 0.1–1.0)
Monocytes Relative: 10 % (ref 3–12)
RDW: 13.1 % (ref 11.5–15.5)

## 2012-07-17 LAB — TROPONIN I: Troponin I: <0.3 ng/mL (ref <0.30)

## 2012-07-17 MED ORDER — ACETAMINOPHEN 325 MG PO TABS
650.0000 mg | ORAL_TABLET | ORAL | Status: DC | PRN
  Administered 2012-07-17 – 2012-07-18 (×2): 650 mg via ORAL
  Filled 2012-07-17 (×2): qty 2

## 2012-07-17 MED ORDER — INSULIN ASPART 100 UNIT/ML ~~LOC~~ SOLN
0.0000 [IU] | Freq: Three times a day (TID) | SUBCUTANEOUS | Status: DC
  Administered 2012-07-18: 3 [IU] via SUBCUTANEOUS
  Administered 2012-07-18: 1 [IU] via SUBCUTANEOUS

## 2012-07-17 MED ORDER — NITROGLYCERIN 0.4 MG SL SUBL: 0.4000 mg | SUBLINGUAL_TABLET | SUBLINGUAL | Status: DC | PRN

## 2012-07-17 MED ORDER — ASPIRIN EC 81 MG PO TBEC
81.0000 mg | DELAYED_RELEASE_TABLET | Freq: Every day | ORAL | Status: DC
  Administered 2012-07-18: 81 mg via ORAL
  Filled 2012-07-17: qty 1

## 2012-07-17 MED ORDER — ENOXAPARIN SODIUM 40 MG/0.4ML ~~LOC~~ SOLN
40.0000 mg | SUBCUTANEOUS | Status: DC
  Administered 2012-07-17: 40 mg via SUBCUTANEOUS
  Filled 2012-07-17 (×2): qty 0.4

## 2012-07-17 MED ORDER — TRAZODONE HCL 100 MG PO TABS
100.0000 mg | ORAL_TABLET | Freq: Every day | ORAL | Status: DC
  Administered 2012-07-17: 100 mg via ORAL
  Filled 2012-07-17 (×3): qty 1

## 2012-07-17 MED ORDER — FENOFIBRATE 160 MG PO TABS
160.0000 mg | ORAL_TABLET | Freq: Every day | ORAL | Status: DC
  Administered 2012-07-18: 160 mg via ORAL
  Filled 2012-07-17: qty 1

## 2012-07-17 MED ORDER — RAMIPRIL 5 MG PO CAPS
5.0000 mg | ORAL_CAPSULE | Freq: Every day | ORAL | Status: DC
  Administered 2012-07-18: 5 mg via ORAL
  Filled 2012-07-17: qty 1

## 2012-07-17 MED ORDER — CITALOPRAM HYDROBROMIDE 20 MG PO TABS
20.0000 mg | ORAL_TABLET | Freq: Every day | ORAL | Status: DC
  Administered 2012-07-18: 20 mg via ORAL
  Filled 2012-07-17: qty 1

## 2012-07-17 MED ORDER — NITROGLYCERIN 2 % TD OINT
0.5000 [in_us] | TOPICAL_OINTMENT | TRANSDERMAL | Status: AC
  Administered 2012-07-17: 0.5 [in_us] via TOPICAL
  Filled 2012-07-17: qty 1

## 2012-07-17 MED ORDER — GABAPENTIN 300 MG PO CAPS
300.0000 mg | ORAL_CAPSULE | Freq: Three times a day (TID) | ORAL | Status: DC
  Administered 2012-07-17 – 2012-07-18 (×3): 300 mg via ORAL
  Filled 2012-07-17 (×5): qty 1

## 2012-07-17 MED ORDER — INSULIN ASPART 100 UNIT/ML ~~LOC~~ SOLN: 0.0000 [IU] | Freq: Every day | SUBCUTANEOUS | Status: DC

## 2012-07-17 MED ORDER — INSULIN GLARGINE 100 UNIT/ML ~~LOC~~ SOLN
30.0000 [IU] | Freq: Two times a day (BID) | SUBCUTANEOUS | Status: DC
  Administered 2012-07-17 – 2012-07-18 (×2): 30 [IU] via SUBCUTANEOUS

## 2012-07-17 MED ORDER — PANTOPRAZOLE SODIUM 40 MG PO TBEC
40.0000 mg | DELAYED_RELEASE_TABLET | Freq: Every day | ORAL | Status: DC
  Administered 2012-07-18: 40 mg via ORAL
  Filled 2012-07-17: qty 1

## 2012-07-17 MED ORDER — ONDANSETRON HCL 4 MG/2ML IJ SOLN
4.0000 mg | Freq: Four times a day (QID) | INTRAMUSCULAR | Status: DC | PRN
  Administered 2012-07-18: 4 mg via INTRAVENOUS
  Filled 2012-07-17: qty 2

## 2012-07-17 MED ORDER — FLUTICASONE-SALMETEROL 250-50 MCG/DOSE IN AEPB
1.0000 | INHALATION_SPRAY | Freq: Two times a day (BID) | RESPIRATORY_TRACT | Status: DC
  Administered 2012-07-17 – 2012-07-18 (×2): 1 via RESPIRATORY_TRACT
  Filled 2012-07-17: qty 14

## 2012-07-17 MED ORDER — SODIUM CHLORIDE 0.9 % IV SOLN
INTRAVENOUS | Status: AC
  Administered 2012-07-17: 23:00:00 via INTRAVENOUS

## 2012-07-17 MED ORDER — ONDANSETRON HCL 4 MG/2ML IJ SOLN
4.0000 mg | Freq: Once | INTRAMUSCULAR | Status: AC
  Administered 2012-07-17: 4 mg via INTRAVENOUS
  Filled 2012-07-17: qty 2

## 2012-07-17 MED ORDER — ATORVASTATIN CALCIUM 80 MG PO TABS
80.0000 mg | ORAL_TABLET | Freq: Every day | ORAL | Status: DC
  Administered 2012-07-18: 80 mg via ORAL
  Filled 2012-07-17: qty 1

## 2012-07-17 MED ORDER — RAMIPRIL 5 MG PO TABS: 5.0000 mg | ORAL_TABLET | Freq: Every day | ORAL | Status: DC

## 2012-07-17 NOTE — ED Notes (Signed)
Pt describes noticing chest pain when he woke up this morning, intermittent in nature, lasting for a few minutes at time.

## 2012-07-17 NOTE — H&P (Signed)
Cardiology History and Physical  TAPPER,DAVID B, MD  History of Present Illness (and review of medical records): [name] [name] is a 56 y.o. male who presents for evaluation of chest pain.  Of note he had recent admission to [facility] in August for chest pain which he states was different.  He was ruled out for MI and had normal TTE.  He had follow up appointment with Dr. Lutricia Feil on 8/26.  He now returns with chest pain that began this am prior to church.  Pain is described as constant pressure like sensation with intermittent sharp episodes at left side of chest that radiate to left neck.  Pain is 10/10 at its worse and is associated with shortness of breath, diaphoresis, and nausea.  He called EMS after episode around 2pm and was brought to ED for further evaluation.  He was given ASA/NTG.  He has no acute changes on ecg and initial troponin is normal.  Previous diagnostic testing for coronary artery disease includes: cardiac catheterization, echocardiogram and adenosine myocardial perfusion imaging. Previous history of cardiac disease includes Chest Pain Coronary Artery Disease Coronary Artery Stent. Coronary artery disease risk factors include: diabetes mellitus, dyslipidemia, male gender and obesity (BMI >= 30 kg/m2). Patient denies history of CABG.  Review of Systems Further review of systems was otherwise negative other than stated in HPI.  Patient Active Problem List   Diagnosis Date Noted  . Insomnia 07/04/2012  . Obstructive sleep apnea 07/04/2012  . Diabetes mellitus 07/04/2012  . Achilles tendon injury 07/04/2012  . HYPERLIPIDEMIA-MIXED 07/30/2009  . OBESITY-MORBID (>100') 07/30/2009  . HYPERTENSION, UNSPECIFIED 07/30/2009  . CAD, NATIVE VESSEL 07/30/2009   Past Medical History  Diagnosis Date  . Coronary artery disease   . Diabetes mellitus   . Asthma   . Hypertension     Past Surgical History  Procedure Date  . Neck surgery   . Carpal tunnel release   . Coronary  stent placement   . Shoulder arthroscopy      (Not in a hospital admission) No Known Allergies  History  Substance Use Topics  . Smoking status: Never Smoker   . Smokeless tobacco: Not on file  . Alcohol Use: No    History reviewed. No pertinent family history.   Objective: Patient Vitals for the past 8 hrs:  BP Temp Temp src Pulse Resp SpO2  07/17/12 1715 118/68 mmHg - - 70  12  99 %  07/17/12 1700 116/67 mmHg - - 72  13  98 %  07/17/12 1645 111/65 mmHg - - 73  12  96 %  07/17/12 1630 110/63 mmHg - - 78  14  98 %  07/17/12 1615 121/67 mmHg - - 74  13  100 %  07/17/12 1600 108/62 mmHg - - 72  9  98 %  07/17/12 1553 110/86 mmHg 98.2 F (36.8 C) Oral 76  - 99 %   General Appearance:    Alert, cooperative, no distress, appears stated age  Head:    Normocephalic, without obvious abnormality, atraumatic  Eyes:     PERRL, EOMI, anicteric sclerae  Neck:   Supple, no carotid bruit or JVD  Lungs:     Clear to auscultation bilaterally, respirations unlabored  Heart:    Regular rate and rhythm, S1 and S2 normal, no murmur  Abdomen:     Soft, non-tender, normoactive bowel sounds  Extremities:   Extremities normal, atraumatic, no cyanosis or edema  Pulses:   2+ and symmetric all  extremities  Skin:   no rashes or lesions  Neurologic:   No focal deficits. AAO x3   Results for orders placed during the hospital encounter of 07/17/12 (from the past 48 hour(s))  CBC WITH DIFFERENTIAL     Status: Abnormal   Collection Time   07/17/12  4:01 PM      Component Value Range Comment   WBC 3.5 (*) 4.0 - 10.5 K/uL    RBC 4.27  4.22 - 5.81 MIL/uL    Hemoglobin 13.5  13.0 - 17.0 g/dL    HCT 37.1 (*) 39.0 - 52.0 %    MCV 86.9  78.0 - 100.0 fL    MCH 31.6  26.0 - 34.0 pg    MCHC 36.4 (*) 30.0 - 36.0 g/dL    RDW 13.1  11.5 - 15.5 %    Platelets 115 (*) 150 - 400 K/uL PLATELET COUNT CONFIRMED BY SMEAR   Neutrophils Relative 45  43 - 77 %    Neutro Abs 1.6 (*) 1.7 - 7.7 K/uL    Lymphocytes Relative  41  12 - 46 %    Lymphs Abs 1.5  0.7 - 4.0 K/uL    Monocytes Relative 10  3 - 12 %    Monocytes Absolute 0.4  0.1 - 1.0 K/uL    Eosinophils Relative 3  0 - 5 %    Eosinophils Absolute 0.1  0.0 - 0.7 K/uL    Basophils Relative 1  0 - 1 %    Basophils Absolute 0.0  0.0 - 0.1 K/uL   COMPREHENSIVE METABOLIC PANEL     Status: Abnormal   Collection Time   07/17/12  4:01 PM      Component Value Range Comment   Sodium 137  135 - 145 mEq/L    Potassium 4.3  3.5 - 5.1 mEq/L    Chloride 105  96 - 112 mEq/L    CO2 23  19 - 32 mEq/L    Glucose, Bld 126 (*) 70 - 99 mg/dL    BUN 15  6 - 23 mg/dL    Creatinine, Ser 1.05  0.50 - 1.35 mg/dL    Calcium 9.4  8.4 - 10.5 mg/dL    Total Protein 6.5  6.0 - 8.3 g/dL    Albumin 3.4 (*) 3.5 - 5.2 g/dL    AST 27  0 - 37 U/L    ALT 24  0 - 53 U/L    Alkaline Phosphatase 31 (*) 39 - 117 U/L    Total Bilirubin 0.4  0.3 - 1.2 mg/dL    GFR calc non Af Amer 79 (*) >90 mL/min    GFR calc Af Amer >90  >90 mL/min   TROPONIN I     Status: Normal   Collection Time   07/17/12  4:02 PM      Component Value Range Comment   Troponin I <0.30  <0.30 ng/mL    Dg Chest Port 1 View  07/17/2012  *RADIOLOGY REPORT*  Clinical Data: Chest pain and shortness of breath.  PORTABLE CHEST - 1 VIEW  Comparison: Chest x-ray 06/23/2012.  Findings: Lung volumes are normal.  No consolidative airspace disease.  No pleural effusions.  No pneumothorax.  No pulmonary nodule or mass noted.  Pulmonary vasculature and the cardiomediastinal silhouette are within normal limits.  IMPRESSION: 1. No radiographic evidence of acute cardiopulmonary disease.   Original Report Authenticated By: Etheleen Mayhew, M.D.     ECG:  Normal sinus rhythm,  no acute ischemic changes  Assessment: 87M with hx of CAD, DM, HTN, HLD, OSA presents with unstable angina.   Plan:  1. Admit to Cardiology, 2. Continuous monitoring on Telemetry. 3. Repeat ekg on admit, prn chest pain or arrythmia 4. Trend cardiac  biomarkers, check lipids, hgba1c, tsh 5. Medical management to include ASA,Statin, BB, ACEi, Statin, NTG prn 6. Further ischemic evaluation pending initial results and clinical course.

## 2012-07-17 NOTE — ED Notes (Signed)
Pt received 324 mg Aspirin, 3 sl nitroglycerin tabs, Morphine 27m IV, 1 liter of NS PTA

## 2012-07-17 NOTE — ED Notes (Signed)
MD at bedside. 

## 2012-07-17 NOTE — ED Provider Notes (Signed)
History     CSN: [id number]  Arrival date & time 07/17/12  1541   First MD Initiated Contact with Patient 07/17/12 [phone]      Chief Complaint  Patient presents with  . Chest Pain    (Consider location/radiation/quality/duration/timing/severity/associated sxs/prior treatment) Patient is a 56 y.o. male presenting with chest pain. The history is provided by the patient.  Chest Pain The chest pain began 3 - 5 hours ago. Chest pain occurs intermittently. The chest pain is worsening. Associated with: unknown. At its most intense, the pain is at 8/10. The pain is currently at 2/10. The severity of the pain is moderate. The quality of the pain is described as sharp. Radiates to: left neck. Primary symptoms include shortness of breath (mild). Pertinent negatives for primary symptoms include no fever, no cough, no abdominal pain, no nausea and no vomiting.  Pertinent negatives for associated symptoms include no numbness. He tried nothing for the symptoms. Risk factors: CAD.     Past Medical History  Diagnosis Date  . Coronary artery disease   . Diabetes mellitus   . Asthma   . Hypertension     Past Surgical History  Procedure Date  . Neck surgery   . Carpal tunnel release   . Coronary stent placement   . Shoulder arthroscopy     History reviewed. No pertinent family history.  History  Substance Use Topics  . Smoking status: Never Smoker   . Smokeless tobacco: Not on file  . Alcohol Use: No      Review of Systems  Constitutional: Negative for fever.  HENT: Negative for rhinorrhea, drooling and neck pain.   Eyes: Negative for pain.  Respiratory: Positive for shortness of breath (mild). Negative for cough.   Cardiovascular: Positive for chest pain. Negative for leg swelling.  Gastrointestinal: Negative for nausea, vomiting, abdominal pain and diarrhea.  Genitourinary: Negative for dysuria and hematuria.  Musculoskeletal: Negative for gait problem.  Skin: Negative for color  change.  Neurological: Negative for numbness and headaches.  Hematological: Negative for adenopathy.  Psychiatric/Behavioral: Negative for behavioral problems.  All other systems reviewed and are negative.    Allergies  Review of patient's allergies indicates no known allergies.  Home Medications   No current outpatient prescriptions on file.  BP 122/80  Pulse 65  Temp 97.4 F (36.3 C) (Oral)  Resp 18  Wt 242 lb 4.8 oz (109.907 kg)  SpO2 93%  Physical Exam  Nursing note and vitals reviewed. Constitutional: He is oriented to person, place, and time. He appears well-developed and well-nourished.  HENT:  Head: Normocephalic and atraumatic.  Right Ear: External ear normal.  Left Ear: External ear normal.  Nose: Nose normal.  Mouth/Throat: Oropharynx is clear and moist. No oropharyngeal exudate.  Eyes: Conjunctivae and EOM are normal. Pupils are equal, round, and reactive to light.  Neck: Normal range of motion. Neck supple.  Cardiovascular: Normal rate, regular rhythm, normal heart sounds and intact distal pulses.  Exam reveals no gallop and no friction rub.   No murmur heard. Pulmonary/Chest: Effort normal and breath sounds normal. No respiratory distress. He has no wheezes.  Abdominal: Soft. Bowel sounds are normal. He exhibits no distension. There is no tenderness. There is no rebound and no guarding.  Musculoskeletal: Normal range of motion. He exhibits no edema and no tenderness.  Neurological: He is alert and oriented to person, place, and time.  Skin: Skin is warm and dry.  Psychiatric: He has a normal mood and affect.  His behavior is normal.    ED Course  Procedures (including critical care time)  Labs Reviewed  CBC WITH DIFFERENTIAL - Abnormal; Notable for the following:    WBC 3.5 (*)     HCT 37.1 (*)     MCHC 36.4 (*)     Platelets 115 (*)  PLATELET COUNT CONFIRMED BY SMEAR   Neutro Abs 1.6 (*)     All other components within normal limits  COMPREHENSIVE  METABOLIC PANEL - Abnormal; Notable for the following:    Glucose, Bld 126 (*)     Albumin 3.4 (*)     Alkaline Phosphatase 31 (*)     GFR calc non Af Amer 79 (*)     All other components within normal limits  GLUCOSE, CAPILLARY - Abnormal; Notable for the following:    Glucose-Capillary 128 (*)     All other components within normal limits  TROPONIN I  TROPONIN I  TROPONIN I  TROPONIN I  CBC  BASIC METABOLIC PANEL  PROTIME-INR  LIPID PANEL   Dg Chest Port 1 View  07/17/2012  *RADIOLOGY REPORT*  Clinical Data: Chest pain and shortness of breath.  PORTABLE CHEST - 1 VIEW  Comparison: Chest x-ray 06/23/2012.  Findings: Lung volumes are normal.  No consolidative airspace disease.  No pleural effusions.  No pneumothorax.  No pulmonary nodule or mass noted.  Pulmonary vasculature and the cardiomediastinal silhouette are within normal limits.  IMPRESSION: 1. No radiographic evidence of acute cardiopulmonary disease.   Original Report Authenticated By: Etheleen Mayhew, M.D.      1. Chest pain      Date: 07/17/2012  Rate: 74  Rhythm: normal sinus rhythm  QRS Axis: normal  Intervals: normal  ST/T Wave abnormalities: normal  Conduction Disutrbances:none  Narrative Interpretation: No new ST or T wave changes cw ischemia  Old EKG Reviewed: unchanged    MDM  12:33 AM 56 y.o. male w hx of CAD s/p stent x2 on ASA, DM, HTN pw cp that began at 10am this morning while at rest and has been intermittent since. Pt AFVSS here, go ASA 325, nitro x 3, and 55m morphine en route. Pain now 2/10. Will get labs.   Labs/imaging non-contrib. Consulted cards who will admit.   Clinical Impression 1. Chest pain         FPamella Pert MD 07/18/12 [phone]

## 2012-07-18 ENCOUNTER — Encounter (HOSPITAL_COMMUNITY): Payer: Self-pay | Admitting: Nurse Practitioner

## 2012-07-18 ENCOUNTER — Inpatient Hospital Stay (HOSPITAL_COMMUNITY): Payer: Medicare Other

## 2012-07-18 DIAGNOSIS — R079 Chest pain, unspecified: Secondary | ICD-10-CM

## 2012-07-18 LAB — BASIC METABOLIC PANEL
BUN: 16 mg/dL (ref 6–23)
Chloride: 107 mEq/L (ref 96–112)
GFR calc Af Amer: 88 mL/min — ABNORMAL LOW (ref >90)
Potassium: 4 mEq/L (ref 3.5–5.1)
Sodium: 139 mEq/L (ref 135–145)

## 2012-07-18 LAB — TROPONIN I: Troponin I: <0.3 ng/mL (ref <0.30)

## 2012-07-18 LAB — LIPID PANEL: Cholesterol: 96 mg/dL (ref 0–200)

## 2012-07-18 LAB — CBC
HCT: 34.7 % — ABNORMAL LOW (ref 39.0–52.0)
Platelets: 99 10*3/uL — ABNORMAL LOW (ref 150–400)
RDW: 13.2 % (ref 11.5–15.5)
WBC: 4.6 10*3/uL (ref 4.0–10.5)

## 2012-07-18 LAB — D-DIMER, QUANTITATIVE: D-Dimer, Quant: 0.24 ug/mL-FEU (ref 0.00–0.48)

## 2012-07-18 LAB — PROTIME-INR
INR: 1.23 (ref 0.00–1.49)
Prothrombin Time: 15.8 seconds — ABNORMAL HIGH (ref 11.6–15.2)

## 2012-07-18 LAB — GLUCOSE, CAPILLARY: Glucose-Capillary: 116 mg/dL — ABNORMAL HIGH (ref 70–99)

## 2012-07-18 MED ORDER — TECHNETIUM TC 99M TETROFOSMIN IV KIT
10.0000 | PACK | Freq: Once | INTRAVENOUS | Status: AC | PRN
  Administered 2012-07-18: 10 via INTRAVENOUS

## 2012-07-18 MED ORDER — ASPIRIN 81 MG PO TBEC: 81.0000 mg | DELAYED_RELEASE_TABLET | Freq: Every day | ORAL | Status: AC

## 2012-07-18 MED ORDER — TECHNETIUM TC 99M TETROFOSMIN IV KIT
30.0000 | PACK | Freq: Once | INTRAVENOUS | Status: AC | PRN
  Administered 2012-07-18: 30 via INTRAVENOUS

## 2012-07-18 MED ORDER — REGADENOSON 0.4 MG/5ML IV SOLN
0.4000 mg | Freq: Once | INTRAVENOUS | Status: AC
  Administered 2012-07-18: 0.4 mg via INTRAVENOUS
  Filled 2012-07-18: qty 5

## 2012-07-18 NOTE — Progress Notes (Signed)
Pt discharge instructions and patient education complete. IV site d/c. Site WNL. No further questions. D/C home with wife. [name] [name]

## 2012-07-18 NOTE — ED Provider Notes (Signed)
I saw and evaluated the patient, reviewed the resident's note and I agree with the findings and plan.  [name] Biles, MD 07/18/12 [phone]

## 2012-07-18 NOTE — Discharge Summary (Signed)
Patient ID: [name] [name],  MRN: [id number], DOB/AGE: [birth year] 56 y.o.  Admit date: 07/17/2012 Discharge date: 07/18/2012  Primary Care Provider: Matthias Hughs B Primary Cardiologist: [facility]  Discharge Diagnoses Principal Problem:  *Chest pain  **negative myoview this admission. Active Problems:  CAD, NATIVE VESSEL  HYPERLIPIDEMIA-MIXED  OBESITY-MORBID (>100')  HYPERTENSION, UNSPECIFIED  Diabetes mellitus  Insomnia  Obstructive sleep apnea  Allergies No Known Allergies  Procedures  Lexiscan Myoview 07/18/2012  Impression:  Stress Myoview:  Electrically negative for ischemia. Myoview scan with normal perfusion.  No evidence for significant ischemia or scar.   LVEF on gating calculated 61% _____________  Bilateral LE ultrasound 07/18/2012  Bilateral:  No evidence of DVT, superficial thrombosis, or Baker's Cyst. _____________  History of Present Illness  56 y/o male with h/o CAD s/p prior stenting of the LAD and obtuse marginal who was in his usoh until the AM of admission, when he began to experience midsternal chest pressure that was constant in nature with intermittent sharp episodes radiating to the left neck.  After several hrs of ongoing discomfort, he called EMS and was taken to [facility] ED where he was treated with asa and ntg.  ECG was non-acute and CE were negative.  He was admitted for further evaluation.  Hospital Course  Pt r/o for MI.  He had no further chest pain.  D-dimer was normal and LE U/S showed no evidence of DVT.  Lexiscan Myoview was performed this AM and showed no evidence of ischemia.  As a result, pt will be discharged home today in good condition.  Discharge Vitals Blood pressure 154/82, pulse 74, temperature 99.2 F (37.3 C), temperature source Oral, resp. rate 18, height 5' 9"  (1.753 m), weight 242 lb 4.8 oz (109.907 kg), SpO2 97.00%.  Filed Weights   07/17/12 2020  Weight: 242 lb 4.8 oz (109.907 kg)   Labs  CBC  Basename  07/18/12 0205 07/17/12 1601  WBC 4.6 3.5*  NEUTROABS -- 1.6*  HGB 12.4* 13.5  HCT 34.7* 37.1*  MCV 87.4 86.9  PLT 99* 706*   Basic Metabolic Panel  Basename 23/76/28 0205 07/17/12 1601  NA 139 137  K 4.0 4.3  CL 107 105  CO2 25 23  GLUCOSE 129* 126*  BUN 16 15  CREATININE 1.08 1.05  CALCIUM 9.1 9.4  MG -- --  PHOS -- --   Liver Function Tests  Basename 07/17/12 1601  AST 27  ALT 24  ALKPHOS 31*  BILITOT 0.4  PROT 6.5  ALBUMIN 3.4*   Cardiac Enzymes  Basename 07/18/12 0852 07/18/12 0205 07/17/12 2108  CKTOTAL -- -- --  CKMB -- -- --  CKMBINDEX -- -- --  TROPONINI <0.30 <0.30 <0.30   D-Dimer  Basename 07/18/12 0852  DDIMER 0.24   Fasting Lipid Panel  Basename 07/18/12 0205  CHOL 96  HDL 24*  LDLCALC 42  TRIG 151*  CHOLHDL 4.0  LDLDIRECT --   Lab Results  Component Value Date   DDIMER 0.24 07/18/2012   Disposition  Pt is being discharged home today in good condition.  Follow-up Plans & Appointments  Follow-up Information    Follow up with Aurora Mask, PA on 08/19/2012. (1:20 PM - [place] [facility] [place])    Contact information:   [name], [place] [phone]          Discharge Medications  Medication List  As of 07/18/2012  3:50 PM   TAKE these medications  aspirin 81 MG EC tablet   Take 1 tablet (81 mg total) by mouth daily.      atorvastatin 80 MG tablet   Commonly known as: LIPITOR   Take 80 mg by mouth daily.      citalopram 20 MG tablet   Commonly known as: CELEXA   Take 20 mg by mouth daily.      fenofibrate 145 MG tablet   Commonly known as: TRICOR   Take 145 mg by mouth daily.      Fluticasone-Salmeterol 250-50 MCG/DOSE Aepb   Commonly known as: ADVAIR   Inhale 1 puff into the lungs every 12 (twelve) hours.      gabapentin 300 MG capsule   Commonly known as: NEURONTIN   Take 300 mg by mouth 3 (three) times daily.      insulin aspart 100 UNIT/ML injection   Commonly known as:  novoLOG   Inject into the skin 3 (three) times daily before meals.      insulin glargine 100 UNIT/ML injection   Commonly known as: LANTUS   Inject into the skin 2 (two) times daily. 60 u am, 60 units pm      meloxicam 15 MG tablet   Commonly known as: MOBIC   Take 15 mg by mouth daily.      metFORMIN 1000 MG tablet   Commonly known as: GLUCOPHAGE   Take 1,000 mg by mouth daily with breakfast. And 557m in the evening      nitroGLYCERIN 0.4 MG SL tablet   Commonly known as: NITROSTAT   Place 0.4 mg under the tongue every 5 (five) minutes as needed.      omeprazole 20 MG capsule   Commonly known as: PRILOSEC   Take 20 mg by mouth 2 (two) times daily.      ramipril 5 MG tablet   Commonly known as: ALTACE   Take 5 mg by mouth daily.      traZODone 100 MG tablet   Commonly known as: DESYREL   Take 1 tablet (100 mg total) by mouth at bedtime.            Outstanding Labs/Studies  none  Duration of Discharge Encounter   Greater than 30 minutes including physician time.  Signed, CMurray HodgkinsNP 07/18/2012, 3:50 PM   I have seen, examined the patient, and reviewed the above assessment and plan.  Changes to above are made where necessary.    Co Sign: JThompson Grayer MD 07/18/2012 8:36 PM

## 2012-07-18 NOTE — Progress Notes (Signed)
SUBJECTIVE: The patient is doing well today.  His CP is much improved.  At this time, he denies shortness of breath, leg pain, or any new concerns.     [name] aspirin EC  81 mg Oral Daily  . atorvastatin  80 mg Oral Daily  . citalopram  20 mg Oral Daily  . enoxaparin (LOVENOX) injection  40 mg Subcutaneous Q24H  . fenofibrate  160 mg Oral Daily  . Fluticasone-Salmeterol  1 puff Inhalation Q12H  . gabapentin  300 mg Oral TID  . insulin aspart  0-5 Units Subcutaneous QHS  . insulin aspart  0-9 Units Subcutaneous TID WC  . insulin glargine  30 Units Subcutaneous BID  . nitroGLYCERIN  0.5 inch Topical STAT  . ondansetron (ZOFRAN) IV  4 mg Intravenous Once  . pantoprazole  40 mg Oral Q1200  . ramipril  5 mg Oral Daily  . regadenoson  0.4 mg Intravenous Once  . traZODone  100 mg Oral QHS  . DISCONTD: ramipril  5 mg Oral Daily      . sodium chloride 75 mL/hr at 07/17/12 2303    OBJECTIVE: Physical Exam: Filed Vitals:   07/17/12 1901 07/17/12 2020 07/18/12 0533 07/18/12 0740  BP:  122/80 91/53   Pulse:  65 80   Temp: 98.7 F (37.1 C) 97.4 F (36.3 C) 97.3 F (36.3 C)   TempSrc: Oral Oral Oral   Resp:  18 18   Weight:  242 lb 4.8 oz (109.907 kg)    SpO2:  93% 93% 94%    Intake/Output Summary (Last 24 hours) at 07/18/12 0752 Last data filed at 07/18/12 0541  Gross per 24 hour  Intake      0 ml  Output    600 ml  Net   -600 ml    Telemetry reveals sinus rhythm  GEN- The patient is well appearing, alert and oriented x 3 today.   Head- normocephalic, atraumatic Eyes-  Sclera clear, conjunctiva pink Ears- hearing intact Oropharynx- clear Neck- supple, no JVP Lymph- no cervical lymphadenopathy Lungs- Clear to ausculation bilaterally, normal work of breathing Heart- Regular rate and rhythm, no murmurs, rubs or gallops, PMI not laterally displaced GI- soft, NT, ND, + BS Extremities- no clubbing, cyanosis, or edema, no homans/cords Skin- no rash or lesion Psych-  euthymic mood, full affect Neuro- strength and sensation are intact  LABS: Basic Metabolic Panel:  Basename 07/18/12 0205 07/17/12 1601  NA 139 137  K 4.0 4.3  CL 107 105  CO2 25 23  GLUCOSE 129* 126*  BUN 16 15  CREATININE 1.08 1.05  CALCIUM 9.1 9.4  MG -- --  PHOS -- --   Liver Function Tests:  Basename 07/17/12 1601  AST 27  ALT 24  ALKPHOS 31*  BILITOT 0.4  PROT 6.5  ALBUMIN 3.4*   No results found for this basename: LIPASE:2,AMYLASE:2 in the last 72 hours CBC:  Basename 07/18/12 0205 07/17/12 1601  WBC 4.6 3.5*  NEUTROABS -- 1.6*  HGB 12.4* 13.5  HCT 34.7* 37.1*  MCV 87.4 86.9  PLT 99* 115*   Cardiac Enzymes:  Basename 07/18/12 0205 07/17/12 2108 07/17/12 1602  CKTOTAL -- -- --  CKMB -- -- --  CKMBINDEX -- -- --  TROPONINI <0.30 <0.30 <0.30   BNP: No components found with this basename: POCBNP:3 D-Dimer: No results found for this basename: DDIMER:2 in the last 72 hours Hemoglobin A1C: No results found for this basename: HGBA1C in the last 72 hours Fasting  Lipid Panel:  Basename 07/18/12 0205  CHOL 96  HDL 24*  LDLCALC 42  TRIG 151*  CHOLHDL 4.0  LDLDIRECT --   Thyroid Function Tests: No results found for this basename: TSH,T4TOTAL,FREET3,T3FREE,THYROIDAB in the last 72 hours Anemia Panel: No results found for this basename: VITAMINB12,FOLATE,FERRITIN,TIBC,IRON,RETICCTPCT in the last 72 hours  RADIOLOGY: Dg Chest Port 1 View  07/17/2012  *RADIOLOGY REPORT*  Clinical Data: Chest pain and shortness of breath.  PORTABLE CHEST - 1 VIEW  Comparison: Chest x-ray 06/23/2012.  Findings: Lung volumes are normal.  No consolidative airspace disease.  No pleural effusions.  No pneumothorax.  No pulmonary nodule or mass noted.  Pulmonary vasculature and the cardiomediastinal silhouette are within normal limits.  IMPRESSION: 1. No radiographic evidence of acute cardiopulmonary disease.   Original Report Authenticated By: Etheleen Mayhew, M.D.      ASSESSMENT AND PLAN:  Active Problems:  CAD, NATIVE VESSEL  Chest pain  1. Chest pain/ CAD- He reports sharp pain in his L lateral chest (near axilla) radiating into his L neck.  This pain is severe and intermittent, typically lasting less than 1 minute.  His pain by history is atypical though he has known CAD.  EKG and CMs are noted. I think that at this time a lexiscan myoview is appropriate for further CV risk stratification.  If low risk then we will treat medically. He has been wearing an orthopedic boot x 2 weeks due to a leg injury.  I therefore think that we should order dopplers to exclude DVT as well as dDimer.  IF abnormal, I would have a low threshold to evaluate for PTE.  2. DM- stable   Thompson Grayer, MD 07/18/2012 7:52 AM

## 2012-07-18 NOTE — Progress Notes (Signed)
Bilateral:  No evidence of DVT, superficial thrombosis, or Baker's Cyst.   

## 2012-07-19 ENCOUNTER — Inpatient Hospital Stay (HOSPITAL_COMMUNITY)
Admission: EM | Admit: 2012-07-19 | Discharge: 2012-07-22 | DRG: 287 | Disposition: A | Discharge status: Home / Self Care | Payer: Medicare Other | Attending: Cardiology | Admitting: Cardiology

## 2012-07-19 ENCOUNTER — Emergency Department (HOSPITAL_COMMUNITY): Payer: Medicare Other

## 2012-07-19 ENCOUNTER — Encounter (HOSPITAL_COMMUNITY): Payer: Self-pay | Admitting: Emergency Medicine

## 2012-07-19 DIAGNOSIS — I10 Essential (primary) hypertension: Secondary | ICD-10-CM

## 2012-07-19 DIAGNOSIS — R55 Syncope and collapse: Secondary | ICD-10-CM

## 2012-07-19 DIAGNOSIS — R079 Chest pain, unspecified: Secondary | ICD-10-CM

## 2012-07-19 DIAGNOSIS — G4700 Insomnia, unspecified: Secondary | ICD-10-CM

## 2012-07-19 DIAGNOSIS — E785 Hyperlipidemia, unspecified: Secondary | ICD-10-CM

## 2012-07-19 DIAGNOSIS — S86009A Unspecified injury of unspecified Achilles tendon, initial encounter: Secondary | ICD-10-CM

## 2012-07-19 DIAGNOSIS — G4733 Obstructive sleep apnea (adult) (pediatric): Secondary | ICD-10-CM

## 2012-07-19 DIAGNOSIS — E119 Type 2 diabetes mellitus without complications: Secondary | ICD-10-CM

## 2012-07-19 DIAGNOSIS — I2510 Atherosclerotic heart disease of native coronary artery without angina pectoris: Secondary | ICD-10-CM

## 2012-07-19 LAB — TROPONIN I
Troponin I: <0.3 ng/mL (ref <0.30)
Troponin I: <0.3 ng/mL (ref <0.30)

## 2012-07-19 LAB — COMPREHENSIVE METABOLIC PANEL
Albumin: 3.7 g/dL (ref 3.5–5.2)
BUN: 17 mg/dL (ref 6–23)
Chloride: 104 mEq/L (ref 96–112)
Creatinine, Ser: 0.99 mg/dL (ref 0.50–1.35)
GFR calc Af Amer: >90 mL/min (ref >90)
Glucose, Bld: 186 mg/dL — ABNORMAL HIGH (ref 70–99)
Total Bilirubin: 0.3 mg/dL (ref 0.3–1.2)

## 2012-07-19 LAB — CBC WITH DIFFERENTIAL/PLATELET
Basophils Absolute: 0 10*3/uL (ref 0.0–0.1)
Basophils Relative: 1 % (ref 0–1)
Eosinophils Absolute: 0.1 10*3/uL (ref 0.0–0.7)
Eosinophils Relative: 4 % (ref 0–5)
HCT: 39.2 % (ref 39.0–52.0)
Hemoglobin: 14.2 g/dL (ref 13.0–17.0)
Lymphocytes Relative: 44 % (ref 12–46)
Lymphs Abs: 1.6 10*3/uL (ref 0.7–4.0)
MCH: 31.8 pg (ref 26.0–34.0)
MCHC: 36.2 g/dL — ABNORMAL HIGH (ref 30.0–36.0)
MCV: 87.9 fL (ref 78.0–100.0)
Monocytes Absolute: 0.3 10*3/uL (ref 0.1–1.0)
Monocytes Relative: 9 % (ref 3–12)
Neutro Abs: 1.5 10*3/uL — ABNORMAL LOW (ref 1.7–7.7)
Neutrophils Relative %: 43 % (ref 43–77)
Platelets: 121 10*3/uL — ABNORMAL LOW (ref 150–400)
RBC: 4.46 MIL/uL (ref 4.22–5.81)
RDW: 13.1 % (ref 11.5–15.5)
WBC: 3.5 10*3/uL — ABNORMAL LOW (ref 4.0–10.5)

## 2012-07-19 LAB — URINALYSIS, ROUTINE W REFLEX MICROSCOPIC
Ketones, ur: NEGATIVE mg/dL
Leukocytes, UA: NEGATIVE
Nitrite: NEGATIVE
Protein, ur: NEGATIVE mg/dL
Urobilinogen, UA: 1 mg/dL (ref 0.0–1.0)
pH: 7 (ref 5.0–8.0)

## 2012-07-19 LAB — URINE MICROSCOPIC-ADD ON

## 2012-07-19 MED ORDER — CITALOPRAM HYDROBROMIDE 20 MG PO TABS
20.0000 mg | ORAL_TABLET | Freq: Every day | ORAL | Status: DC
  Administered 2012-07-20 – 2012-07-22 (×3): 20 mg via ORAL
  Filled 2012-07-19 (×3): qty 1

## 2012-07-19 MED ORDER — TRAZODONE HCL 100 MG PO TABS
100.0000 mg | ORAL_TABLET | Freq: Every day | ORAL | Status: DC
  Administered 2012-07-20 – 2012-07-21 (×3): 100 mg via ORAL
  Filled 2012-07-19 (×4): qty 1

## 2012-07-19 MED ORDER — ATORVASTATIN CALCIUM 80 MG PO TABS
80.0000 mg | ORAL_TABLET | Freq: Every day | ORAL | Status: DC
  Administered 2012-07-20 – 2012-07-21 (×2): 80 mg via ORAL
  Filled 2012-07-19 (×3): qty 1

## 2012-07-19 MED ORDER — MORPHINE SULFATE 4 MG/ML IJ SOLN
4.0000 mg | Freq: Once | INTRAMUSCULAR | Status: AC
  Administered 2012-07-19: 4 mg via INTRAVENOUS
  Filled 2012-07-19: qty 1

## 2012-07-19 MED ORDER — HEPARIN (PORCINE) IN NACL 100-0.45 UNIT/ML-% IJ SOLN
1750.0000 [IU]/h | INTRAMUSCULAR | Status: DC
  Administered 2012-07-20: 1350 [IU]/h via INTRAVENOUS
  Filled 2012-07-19 (×2): qty 250

## 2012-07-19 MED ORDER — NITROGLYCERIN 0.4 MG SL SUBL: 0.4000 mg | SUBLINGUAL_TABLET | SUBLINGUAL | Status: DC | PRN

## 2012-07-19 MED ORDER — FENOFIBRATE 160 MG PO TABS
160.0000 mg | ORAL_TABLET | Freq: Every day | ORAL | Status: DC
  Administered 2012-07-20 – 2012-07-22 (×3): 160 mg via ORAL
  Filled 2012-07-19 (×3): qty 1

## 2012-07-19 MED ORDER — INSULIN GLARGINE 100 UNIT/ML ~~LOC~~ SOLN
60.0000 [IU] | Freq: Two times a day (BID) | SUBCUTANEOUS | Status: DC
  Administered 2012-07-20 – 2012-07-22 (×6): 60 [IU] via SUBCUTANEOUS

## 2012-07-19 MED ORDER — GABAPENTIN 300 MG PO CAPS
300.0000 mg | ORAL_CAPSULE | Freq: Three times a day (TID) | ORAL | Status: DC
  Administered 2012-07-20 – 2012-07-22 (×8): 300 mg via ORAL
  Filled 2012-07-19 (×10): qty 1

## 2012-07-19 MED ORDER — ONDANSETRON HCL 4 MG/2ML IJ SOLN: 4.0000 mg | Freq: Four times a day (QID) | INTRAMUSCULAR | Status: DC | PRN

## 2012-07-19 MED ORDER — METOPROLOL TARTRATE 12.5 MG HALF TABLET
12.5000 mg | ORAL_TABLET | Freq: Two times a day (BID) | ORAL | Status: DC
  Administered 2012-07-20 – 2012-07-22 (×6): 12.5 mg via ORAL
  Filled 2012-07-19 (×7): qty 1

## 2012-07-19 MED ORDER — NITROGLYCERIN IN D5W 200-5 MCG/ML-% IV SOLN
3.0000 ug/min | INTRAVENOUS | Status: DC
  Administered 2012-07-20: 5 ug/min via INTRAVENOUS
  Filled 2012-07-19: qty 250

## 2012-07-19 MED ORDER — PANTOPRAZOLE SODIUM 40 MG PO TBEC
40.0000 mg | DELAYED_RELEASE_TABLET | Freq: Every day | ORAL | Status: DC
  Administered 2012-07-20 – 2012-07-21 (×2): 40 mg via ORAL
  Filled 2012-07-19 (×2): qty 1

## 2012-07-19 MED ORDER — RAMIPRIL 5 MG PO CAPS
5.0000 mg | ORAL_CAPSULE | Freq: Every day | ORAL | Status: DC
  Administered 2012-07-20 – 2012-07-22 (×3): 5 mg via ORAL
  Filled 2012-07-19 (×3): qty 1

## 2012-07-19 MED ORDER — FLUTICASONE-SALMETEROL 250-50 MCG/DOSE IN AEPB
1.0000 | INHALATION_SPRAY | Freq: Two times a day (BID) | RESPIRATORY_TRACT | Status: DC
  Administered 2012-07-20 – 2012-07-22 (×5): 1 via RESPIRATORY_TRACT
  Filled 2012-07-19: qty 14

## 2012-07-19 MED ORDER — MELOXICAM 15 MG PO TABS
15.0000 mg | ORAL_TABLET | Freq: Every day | ORAL | Status: DC
  Administered 2012-07-20 – 2012-07-22 (×3): 15 mg via ORAL
  Filled 2012-07-19 (×4): qty 1

## 2012-07-19 MED ORDER — HEPARIN BOLUS VIA INFUSION
4000.0000 [IU] | Freq: Once | INTRAVENOUS | Status: AC
  Administered 2012-07-19: 4000 [IU] via INTRAVENOUS

## 2012-07-19 MED ORDER — ONDANSETRON HCL 4 MG/2ML IJ SOLN
4.0000 mg | Freq: Once | INTRAMUSCULAR | Status: AC
  Administered 2012-07-19: 4 mg via INTRAVENOUS
  Filled 2012-07-19: qty 2

## 2012-07-19 MED ORDER — ASPIRIN EC 81 MG PO TBEC
81.0000 mg | DELAYED_RELEASE_TABLET | Freq: Every day | ORAL | Status: DC
  Administered 2012-07-20 – 2012-07-22 (×3): 81 mg via ORAL
  Filled 2012-07-19 (×3): qty 1

## 2012-07-19 MED ORDER — ACETAMINOPHEN 325 MG PO TABS
650.0000 mg | ORAL_TABLET | ORAL | Status: DC | PRN
  Administered 2012-07-20: 650 mg via ORAL
  Filled 2012-07-19: qty 2

## 2012-07-19 NOTE — ED Notes (Signed)
"  blacked out" from sitting position today 1030. "chest pressure" 1100. Called EMS. No changes on EKG per EMS.  EMS meds given: 36m Morphine. Nitrox x2

## 2012-07-19 NOTE — ED Notes (Signed)
MD at bedside. Cardiology

## 2012-07-19 NOTE — ED Notes (Signed)
Patient transported to X-ray 

## 2012-07-19 NOTE — Progress Notes (Signed)
ANTICOAGULATION CONSULT NOTE - Initial Consult  Pharmacy Consult for Heparin Indication: chest pain/ACS  No Known Allergies  Patient Measurements: Height: 5' 9"  (175.3 cm) Weight: 242 lb 4.8 oz (109.907 kg) IBW/kg (Calculated) : 70.7  Heparin Dosing Weight: 95 kg  Vital Signs: Temp: 97.5 F (36.4 C) (09/10 2228) Temp src: Oral (09/10 2228) BP: 139/93 mmHg (09/10 2228) Pulse Rate: 76  (09/10 2228)  Labs:  Basename 07/19/12 2028 07/19/12 2002 07/19/12 1954 07/18/12 0852 07/18/12 0205 07/17/12 1601  HGB -- -- 14.2 -- 12.4* --  HCT -- -- 39.2 -- 34.7* 37.1*  PLT -- -- 121* -- 99* 115*  APTT -- -- -- -- -- --  LABPROT -- -- -- -- 15.8* --  INR -- -- -- -- 1.23 --  HEPARINUNFRC -- -- -- -- -- --  CREATININE -- -- 0.99 -- 1.08 1.05  CKTOTAL -- -- -- -- -- --  CKMB -- -- -- -- -- --  TROPONINI <0.30 <0.30 -- <0.30 -- --    Estimated Creatinine Clearance: 104.2 ml/min (by C-G formula based on Cr of 0.99).   Medical History: Past Medical History  Diagnosis Date  . Coronary artery disease     a. h/o LAD & OM stenting;  b. 2007 Cath: nonobs dzs, patent stents;  c. 07/2012 neg MV, EF 61%.  . Diabetes mellitus   . Asthma   . Hypertension     Medications:  See PTA medication list  Assessment: 56 yo male with a history of CAD who presents to the ED with chest pain associated with SOB, nausea, and diaphoresis. Pharmacy consulted to manage heparin. Plan is for cath tomorrow. CBC is normal except for low platelets, however platelets have been on the low side since 06/2008.  Goal of Therapy:  Heparin level 0.3-0.7 units/ml Monitor platelets by anticoagulation protocol: Yes   Plan:  -Heparin 4000 units IV bolus then infuse at 1350 units/hr -Heparin level 6 hours after started -Daily heparin level and CBC while on heparin   [facility], [place].D., BCPS Clinical Pharmacist Pager: [phone] 07/19/2012 10:35 PM

## 2012-07-19 NOTE — ED Provider Notes (Signed)
History     CSN: [id number]  Arrival date & time 07/19/12  1634   First MD Initiated Contact with Patient 07/19/12 1751      Chief Complaint  Patient presents with  . Chest Pain    (Consider location/radiation/quality/duration/timing/severity/associated sxs/prior treatment) Patient is a 56 y.o. male presenting with chest pain. The history is provided by the patient.  Chest Pain   He states that this morning, while getting dressed, he suddenly became lightheaded and then passed out. He estimates loss of consciousness for 20 minutes. When he woke up, he was still feeling lightheaded. There is associated nausea and diaphoresis. Following that, he developed chest pain which is a heavy feeling across his chest. Pain was severe and he rated it at 7/10. He was evaluated by EMS and transported to the emergency department. EMS gave him aspirin, nitroglycerin, and morphine. He states the nitroglycerin gave partial relief of pain and morphine for relief of pain. Pain is currently rated at 1/10. He states he still feels slight dyspnea and slight nausea but is no longer diaphoretic.  Past Medical History  Diagnosis Date  . Coronary artery disease     a. h/o LAD & OM stenting;  b. 2007 Cath: nonobs dzs, patent stents;  c. 07/2012 neg MV, EF 61%.  . Diabetes mellitus   . Asthma   . Hypertension     Past Surgical History  Procedure Date  . Neck surgery   . Carpal tunnel release   . Coronary stent placement   . Shoulder arthroscopy   . Knee surgery 4/13    History reviewed. No pertinent family history.  History  Substance Use Topics  . Smoking status: Never Smoker   . Smokeless tobacco: Not on file  . Alcohol Use: No      Review of Systems  Cardiovascular: Positive for chest pain.  All other systems reviewed and are negative.    Allergies  Review of patient's allergies indicates no known allergies.  Home Medications   Current Outpatient Rx  Name Route Sig Dispense Refill    . ASPIRIN 81 MG PO TBEC Oral Take 1 tablet (81 mg total) by mouth daily.    . ATORVASTATIN CALCIUM 80 MG PO TABS Oral Take 80 mg by mouth daily.     [name] CITALOPRAM HYDROBROMIDE 20 MG PO TABS Oral Take 20 mg by mouth daily.    . FENOFIBRATE 145 MG PO TABS Oral Take 145 mg by mouth daily.    [name] FLUTICASONE-SALMETEROL 250-50 MCG/DOSE IN AEPB Inhalation Inhale 1 puff into the lungs every 12 (twelve) hours.    [name] GABAPENTIN 300 MG PO CAPS Oral Take 300 mg by mouth 3 (three) times daily.    . INSULIN ASPART 100 UNIT/ML [place] SOLN Subcutaneous Inject into the skin 3 (three) times daily before meals.    . INSULIN GLARGINE 100 UNIT/ML [place] SOLN Subcutaneous Inject into the skin 2 (two) times daily. 60 u am, 60 units pm    . MELOXICAM 15 MG PO TABS Oral Take 15 mg by mouth daily.     [name] METFORMIN HCL 1000 MG PO TABS Oral Take 1,000 mg by mouth daily with breakfast. And 51m in the evening    . NITROGLYCERIN 0.4 MG SL SUBL Sublingual Place 0.4 mg under the tongue every 5 (five) minutes as needed.    .Marland KitchenOMEPRAZOLE 20 MG PO CPDR Oral Take 20 mg by mouth 2 (two) times daily.    .Marland KitchenRAMIPRIL 5 MG PO TABS  Oral Take 5 mg by mouth daily.    . TRAZODONE HCL 100 MG PO TABS Oral Take 1 tablet (100 mg total) by mouth at bedtime. 30 tablet 1    BP 131/79  Pulse 77  Temp 98.7 F (37.1 C) (Oral)  Resp 13  SpO2 95%  Physical Exam  Nursing note and vitals reviewed. 56year old male, resting comfortably and in no acute distress. Vital signs are normal. Oxygen saturation is 96%, which is normal. Head is normocephalic and atraumatic. PERRLA, EOMI. Oropharynx is clear. Neck is nontender and supple without adenopathy or JVD. No carotid bruits are heard. Back is nontender and there is no CVA tenderness. Lungs are clear without rales, wheezes, or rhonchi. Chest is nontender. Heart has regular rate and rhythm without murmur. Abdomen is soft, flat, nontender without masses or hepatosplenomegaly and peristalsis is  normoactive. Extremities have no cyanosis or edema, full range of motion is present. Skin is warm and dry without rash. Neurologic: Mental status is normal, cranial nerves are intact, there are no motor or sensory deficits.   ED Course  Procedures (including critical care time)  Results for orders placed during the hospital encounter of 07/19/12  GLUCOSE, CAPILLARY      Component Value Range   Glucose-Capillary 251 (*) 70 - 99 mg/dL  CBC WITH DIFFERENTIAL      Component Value Range   WBC 3.5 (*) 4.0 - 10.5 K/uL   RBC 4.46  4.22 - 5.81 MIL/uL   Hemoglobin 14.2  13.0 - 17.0 g/dL   HCT 39.2  39.0 - 52.0 %   MCV 87.9  78.0 - 100.0 fL   MCH 31.8  26.0 - 34.0 pg   MCHC 36.2 (*) 30.0 - 36.0 g/dL   RDW 13.1  11.5 - 15.5 %   Platelets 121 (*) 150 - 400 K/uL   Neutrophils Relative 43  43 - 77 %   Neutro Abs 1.5 (*) 1.7 - 7.7 K/uL   Lymphocytes Relative 44  12 - 46 %   Lymphs Abs 1.6  0.7 - 4.0 K/uL   Monocytes Relative 9  3 - 12 %   Monocytes Absolute 0.3  0.1 - 1.0 K/uL   Eosinophils Relative 4  0 - 5 %   Eosinophils Absolute 0.1  0.0 - 0.7 K/uL   Basophils Relative 1  0 - 1 %   Basophils Absolute 0.0  0.0 - 0.1 K/uL  COMPREHENSIVE METABOLIC PANEL      Component Value Range   Sodium 140  135 - 145 mEq/L   Potassium 4.6  3.5 - 5.1 mEq/L   Chloride 104  96 - 112 mEq/L   CO2 28  19 - 32 mEq/L   Glucose, Bld 186 (*) 70 - 99 mg/dL   BUN 17  6 - 23 mg/dL   Creatinine, Ser 0.99  0.50 - 1.35 mg/dL   Calcium 10.1  8.4 - 10.5 mg/dL   Total Protein 7.2  6.0 - 8.3 g/dL   Albumin 3.7  3.5 - 5.2 g/dL   AST 20  0 - 37 U/L   ALT 21  0 - 53 U/L   Alkaline Phosphatase 33 (*) 39 - 117 U/L   Total Bilirubin 0.3  0.3 - 1.2 mg/dL   GFR calc non Af Amer >90  >90 mL/min   GFR calc Af Amer >90  >90 mL/min  TROPONIN I      Component Value Range   Troponin I <0.30  <0.30 ng/mL  TROPONIN I      Component Value Range   Troponin I <0.30  <0.30 ng/mL  URINALYSIS, ROUTINE W REFLEX MICROSCOPIC       Component Value Range   Color, Urine YELLOW  YELLOW   APPearance CLEAR  CLEAR   Specific Gravity, Urine 1.019  1.005 - 1.030   pH 7.0  5.0 - 8.0   Glucose, UA >1000 (*) NEGATIVE mg/dL   Hgb urine dipstick NEGATIVE  NEGATIVE   Bilirubin Urine NEGATIVE  NEGATIVE   Ketones, ur NEGATIVE  NEGATIVE mg/dL   Protein, ur NEGATIVE  NEGATIVE mg/dL   Urobilinogen, UA 1.0  0.0 - 1.0 mg/dL   Nitrite NEGATIVE  NEGATIVE   Leukocytes, UA NEGATIVE  NEGATIVE  URINE RAPID DRUG SCREEN (HOSP PERFORMED)      Component Value Range   Opiates POSITIVE (*) NONE DETECTED   Cocaine NONE DETECTED  NONE DETECTED   Benzodiazepines NONE DETECTED  NONE DETECTED   Amphetamines NONE DETECTED  NONE DETECTED   Tetrahydrocannabinol NONE DETECTED  NONE DETECTED   Barbiturates NONE DETECTED  NONE DETECTED  URINE MICROSCOPIC-ADD ON      Component Value Range   Squamous Epithelial / LPF RARE  RARE   Dg Chest 2 View  07/19/2012  *RADIOLOGY REPORT*  Clinical Data: Chest pain and shortness of breath.  CHEST - 2 VIEW  Comparison: One view chest 07/17/2012.  Findings: The heart size is normal.  The lungs are clear.  The visualized soft tissues and bony thorax are unremarkable.  IMPRESSION: Negative chest.   Original Report Authenticated By: Resa Miner. MATTERN, M.D.    Ct Head Wo Contrast  07/19/2012  *RADIOLOGY REPORT*  Clinical Data: Syncopal episode.  CT HEAD WITHOUT CONTRAST  Technique:  Contiguous axial images were obtained from the base of the skull through the vertex without contrast.  Comparison: 03/25/2012.  Findings: No acute intracranial abnormality is present. Specifically, there is no evidence for acute infarct, hemorrhage, mass, hydrocephalus, or extra-axial fluid collection.  The paranasal sinuses and mastoid air cells are clear.  The globes and orbits are intact.  The osseous skull is intact.  IMPRESSION: Negative CT of the head.   Original Report Authenticated By: Resa Miner. MATTERN, M.D.     ECG shows  normal sinus rhythm with a rate of 78, no ectopy. Normal axis. Normal P wave. Normal QRS. Normal intervals. Normal ST and T waves. Impression: normal ECG. When compared with ECG of 07/18/2012, nonspecific ST changes have resolved.   1. Syncope   2. Chest pain       MDM  Chest pain and syncope. Syncope seems to be the more urgent concern. Carotids are reviewed and he had been admitted to the hospital 2 days ago and discharged yesterday for chest pain. At that time, he had negative stress Myoview. He also, according to office records, had a cardiac catheterization one year ago which showed patent stents and no significant obstruction  Because of prolonged syncope, he will need to be admitted for evaluation of same. Patient and family are very concerned that in the past, the stress test did not show significant disease and are worried that he may need another catheterization. I have explained that decision for catheterization will need to be made by his cardiologist and that his cardiologist would see him during the course of his hospitalization. Workup has come back negative for acute process including negative troponin. Chest pain is waxed and waned in the emergency department and he has  required several injections of morphine.   Case is been discussed with Dr. Radford Pax of cardiology service who agrees to admit the patient.  Delora Fuel, MD [id number]

## 2012-07-19 NOTE — H&P (Addendum)
Admit date: 07/19/2012 Referring Physician  Dr. Roxanne Mins Primary Cardiologist Dr. Lesle Reek Chief complaint/reason for admission: chest pain  HPI: This is a 56yo WM with a history of CAD s/p PCI of the proximal  LAD in 2004, Cypher stent to the mid LAD with cutting balloon PCI of the first diagonal in 2005,  subbranch stenosis of OM1 70% by cath 2006.  Cath done in 2007 showed  patent LAD stents with 20% in-stent restenosis of the more proximal stent.  There was TIMI 2 flow in the second septal perforator felt due to some jailing from the second LAF stent, 80% ostial stenosis of superior subbranch of OM1 and nonobstructive ASCAD of the RCA up to 20% with normal LVF.  He undewent cutting balloon angioplasty of the OM.  He had recurrent chest pain later in 2007 and cath showed mild restenosis of the prior cutting balloon angioplasty site in the OM felt not to explain his CP and medically managed. He was admitted most recently on the 8th of September with complaints of chest pain that was constant with episodes of sharp pain radiating to the left neck.  He ruled out for MI and EKG was non ischemic.  D-dimer was normal and LE u/s was showed no DVT.  He underwent Lexiscan Myoview which showed no ischemia and he was discharged to home.  He says since his discharge the CP has been intermittent.  This am he had just sat down on the couch to put a brace on his leg around 10:45am and the next thing he remembers is waking up slumped on the couch around 11:35am.  When he woke up he was confused and did not know where he was.  There was no tongue biting or urinary or fecal incontinence.  He got up and drove to pick up his wife.  His wife said he appeared pale and diaphoretic.  He went to lunch with his wife.  He then went to his sister's house and became very red and complained of chest pain that he described as a pressure sensation.  The pressure was located in the left chest and radiated into his left arm and back and neck.  He  was SOB, nauseated and diaphoretic.  EMS was called and his sister gave him 1 SL NTG and 2 full dose ASA.  He was then given 2 more SL NTG by EMS and morphine.  The chest pain improved but then started back up again.  Currently he complains of a 3/10 CP.      PMH:    Past Medical History  Diagnosis Date  . Coronary artery disease     a. h/o LAD & OM stenting;  b. 2007 Cath: nonobs dzs, patent stents;  c. 07/2012 neg MV, EF 61%.  . Diabetes mellitus   . Asthma   . Hypertension     PSH:    Past Surgical History  Procedure Date  . Neck surgery   . Carpal tunnel release   . Coronary stent placement   . Shoulder arthroscopy    Elbow surgery   . Knee surgery  4/13    ALLERGIES:   Review of patient's allergies indicates no known allergies.  Prior to Admit Meds:   (Not in a hospital admission) Family HX:   History reviewed. No pertinent family history. Social HX:    History   Social History  . Marital Status: Married    Spouse Name: N/A    Number of Children: N/A  .  Years of Education: N/A   Occupational History  . Not on file.   Social History Main Topics  . Smoking status: Never Smoker   . Smokeless tobacco: Not on file  . Alcohol Use: No  . Drug Use: No  . Sexually Active: Not Currently   Other Topics Concern  . Not on file   Social History Narrative  . No narrative on file     ROS:  All 11 ROS were addressed and are negative except what is stated in the HPI  PHYSICAL EXAM Filed Vitals:   07/19/12 2110  BP: 161/93  Pulse: 73  Temp:   Resp: 12   General: Well developed, well nourished, in no acute distress Head: Eyes PERRLA, No xanthomas.   Normal cephalic and atramatic  Lungs:   Clear bilaterally to auscultation and percussion. Heart:   HRRR S1 S2 Pulses are 2+ & equal.            No carotid bruit. No JVD.  No abdominal bruits. No femoral bruits. Abdomen: Bowel sounds are positive, abdomen soft and non-tender without masses  Extremities:   No clubbing,  cyanosis or edema.  DP +1 Neuro: Alert and oriented X 3. Psych:  Good affect, responds appropriately   Labs:   Lab Results  Component Value Date   WBC 3.5* 07/19/2012   HGB 14.2 07/19/2012   HCT 39.2 07/19/2012   MCV 87.9 07/19/2012   PLT 121* 07/19/2012    Lab 07/19/12 1954  NA 140  K 4.6  CL 104  CO2 28  BUN 17  CREATININE 0.99  CALCIUM 10.1  PROT 7.2  BILITOT 0.3  ALKPHOS 33*  ALT 21  AST 20  GLUCOSE 186*   Lab Results  Component Value Date   TROPONINI <0.30 07/19/2012   No results found for this basename: PTT   Lab Results  Component Value Date   INR 1.23 07/18/2012   INR 1.1 06/13/2008     Lab Results  Component Value Date   CHOL 96 07/18/2012   CHOL  Value: 161        ATP III CLASSIFICATION:  <200     mg/dL   Desirable  200-239  mg/dL   Borderline High  >=240    mg/dL   High 06/14/2008   Lab Results  Component Value Date   HDL 24* 07/18/2012   HDL 25* 06/14/2008   Lab Results  Component Value Date   LDLCALC 42 07/18/2012   LDLCALC  Value: 88        Total Cholesterol/HDL:CHD Risk Coronary Heart Disease Risk Table                     Men   Women  1/2 Average Risk   3.4   3.3 06/14/2008   Lab Results  Component Value Date   TRIG 151* 07/18/2012   TRIG 238* 06/14/2008   Lab Results  Component Value Date   CHOLHDL 4.0 07/18/2012   CHOLHDL 6.4 06/14/2008   No results found for this basename: LDLDIRECT      Radiology:    *RADIOLOGY REPORT*  Clinical Data: Chest pain and shortness of breath.  CHEST - 2 VIEW  Comparison: One view chest 07/17/2012.  Findings: The heart size is normal. The lungs are clear. The  visualized soft tissues and bony thorax are unremarkable.  IMPRESSION:  Negative chest.  Original Report Authenticated By: Resa Miner. MATTERN, M.D.  EKG:  NSR with nonspecific ST abnormality  ASSESSMENT:  1.  Chest pain syndrome with negative cardiac enzymes x 1 and no ischemic changes on EKG.   2.  CAD s/p PCI of LAD with 2 stents, cutting  balloon PCI of diagonal, cutting balloon angioplasty of the OM 3.  Syncope 4.  HTN 5.  Asthma 6.  Dyslipidemia 7.  Obesity 8.  DM  PLAN:   1.  Admit to stepdown bed 2.  IV Heparin gtt per pharmacy 3.  IV NTG gtt for chest pain 4.  Continue AceI/ASA/statin 5.  Lopressor 12.63m BID 6.  NPO after MN for [facility] 7.  Hold Metformin TSueanne Margarita MD  07/19/2012  9:37 PM

## 2012-07-20 ENCOUNTER — Encounter (HOSPITAL_COMMUNITY)
Admission: EM | Disposition: A | Discharge status: Home / Self Care | Payer: Self-pay | Source: Home / Self Care | Attending: Cardiology

## 2012-07-20 DIAGNOSIS — I2510 Atherosclerotic heart disease of native coronary artery without angina pectoris: Secondary | ICD-10-CM

## 2012-07-20 DIAGNOSIS — R079 Chest pain, unspecified: Secondary | ICD-10-CM

## 2012-07-20 HISTORY — PX: LEFT HEART CATHETERIZATION WITH CORONARY ANGIOGRAM: SHX5451

## 2012-07-20 LAB — CBC WITH DIFFERENTIAL/PLATELET
Basophils Absolute: 0 10*3/uL (ref 0.0–0.1)
Basophils Relative: 0 % (ref 0–1)
Eosinophils Absolute: 0.1 10*3/uL (ref 0.0–0.7)
Eosinophils Relative: 4 % (ref 0–5)
HCT: 39.9 % (ref 39.0–52.0)
Hemoglobin: 14.5 g/dL (ref 13.0–17.0)
MCH: 32.1 pg (ref 26.0–34.0)
MCHC: 36.3 g/dL — ABNORMAL HIGH (ref 30.0–36.0)
MCV: 88.3 fL (ref 78.0–100.0)
Monocytes Absolute: 0.3 10*3/uL (ref 0.1–1.0)
Monocytes Relative: 9 % (ref 3–12)
RDW: 13.2 % (ref 11.5–15.5)

## 2012-07-20 LAB — PROTIME-INR: INR: 1.13 (ref 0.00–1.49)

## 2012-07-20 LAB — GLUCOSE, CAPILLARY
Glucose-Capillary: 167 mg/dL — ABNORMAL HIGH (ref 70–99)
Glucose-Capillary: 182 mg/dL — ABNORMAL HIGH (ref 70–99)
Glucose-Capillary: 235 mg/dL — ABNORMAL HIGH (ref 70–99)

## 2012-07-20 LAB — CBC
Hemoglobin: 12.8 g/dL — ABNORMAL LOW (ref 13.0–17.0)
MCH: 30.9 pg (ref 26.0–34.0)
MCHC: 34.7 g/dL (ref 30.0–36.0)
Platelets: 130 10*3/uL — ABNORMAL LOW (ref 150–400)

## 2012-07-20 LAB — HEPARIN LEVEL (UNFRACTIONATED): Heparin Unfractionated: 0.2 IU/mL — ABNORMAL LOW (ref 0.30–0.70)

## 2012-07-20 LAB — COMPREHENSIVE METABOLIC PANEL
Albumin: 3.7 g/dL (ref 3.5–5.2)
Alkaline Phosphatase: 32 U/L — ABNORMAL LOW (ref 39–117)
BUN: 16 mg/dL (ref 6–23)
Creatinine, Ser: 0.97 mg/dL (ref 0.50–1.35)
GFR calc Af Amer: >90 mL/min (ref >90)
Glucose, Bld: 178 mg/dL — ABNORMAL HIGH (ref 70–99)
Potassium: 3.8 mEq/L (ref 3.5–5.1)
Total Protein: 7.2 g/dL (ref 6.0–8.3)

## 2012-07-20 LAB — TROPONIN I
Troponin I: <0.3 ng/mL (ref <0.30)
Troponin I: <0.3 ng/mL (ref <0.30)

## 2012-07-20 LAB — MRSA PCR SCREENING: MRSA by PCR: NEGATIVE

## 2012-07-20 SURGERY — LEFT HEART CATHETERIZATION WITH CORONARY ANGIOGRAM
Anesthesia: LOCAL

## 2012-07-20 MED ORDER — ASPIRIN 81 MG PO CHEW
324.0000 mg | CHEWABLE_TABLET | ORAL | Status: AC
  Administered 2012-07-20: 243 mg via ORAL
  Filled 2012-07-20: qty 3

## 2012-07-20 MED ORDER — HEPARIN (PORCINE) IN NACL 2-0.9 UNIT/ML-% IJ SOLN
INTRAMUSCULAR | Status: AC
  Filled 2012-07-20: qty 2000

## 2012-07-20 MED ORDER — FENTANYL CITRATE 0.05 MG/ML IJ SOLN
INTRAMUSCULAR | Status: AC
  Filled 2012-07-20: qty 2

## 2012-07-20 MED ORDER — HEPARIN BOLUS VIA INFUSION
2000.0000 [IU] | Freq: Once | INTRAVENOUS | Status: AC
  Administered 2012-07-20: 2000 [IU] via INTRAVENOUS
  Filled 2012-07-20: qty 2000

## 2012-07-20 MED ORDER — METFORMIN HCL 1000 MG PO TABS: 1000.0000 mg | ORAL_TABLET | Freq: Every day | ORAL | Status: DC

## 2012-07-20 MED ORDER — SODIUM CHLORIDE 0.9 % IJ SOLN: 3.0000 mL | INTRAMUSCULAR | Status: DC | PRN

## 2012-07-20 MED ORDER — SODIUM CHLORIDE 0.9 % IV SOLN: INTRAVENOUS | Status: AC

## 2012-07-20 MED ORDER — SODIUM CHLORIDE 0.9 % IJ SOLN
3.0000 mL | Freq: Two times a day (BID) | INTRAMUSCULAR | Status: DC
  Administered 2012-07-20: 3 mL via INTRAVENOUS

## 2012-07-20 MED ORDER — INFLUENZA VIRUS VACC SPLIT PF IM SUSP
0.5000 mL | INTRAMUSCULAR | Status: AC
  Administered 2012-07-21: 0.5 mL via INTRAMUSCULAR
  Filled 2012-07-20: qty 0.5

## 2012-07-20 MED ORDER — INSULIN ASPART 100 UNIT/ML ~~LOC~~ SOLN
0.0000 [IU] | Freq: Three times a day (TID) | SUBCUTANEOUS | Status: DC
  Administered 2012-07-20: 3 [IU] via SUBCUTANEOUS
  Administered 2012-07-21: 5 [IU] via SUBCUTANEOUS
  Administered 2012-07-21: 2 [IU] via SUBCUTANEOUS

## 2012-07-20 MED ORDER — MIDAZOLAM HCL 2 MG/2ML IJ SOLN
INTRAMUSCULAR | Status: AC
  Filled 2012-07-20: qty 2

## 2012-07-20 MED ORDER — LIDOCAINE HCL (PF) 1 % IJ SOLN
INTRAMUSCULAR | Status: AC
  Filled 2012-07-20: qty 30

## 2012-07-20 MED ORDER — SODIUM CHLORIDE 0.9 % IV SOLN: 250.0000 mL | INTRAVENOUS | Status: DC | PRN

## 2012-07-20 MED ORDER — DIAZEPAM 5 MG PO TABS
5.0000 mg | ORAL_TABLET | ORAL | Status: AC
  Administered 2012-07-20: 5 mg via ORAL
  Filled 2012-07-20: qty 1

## 2012-07-20 MED ORDER — HEPARIN SODIUM (PORCINE) 1000 UNIT/ML IJ SOLN
INTRAMUSCULAR | Status: AC
  Filled 2012-07-20: qty 1

## 2012-07-20 MED ORDER — NITROGLYCERIN 0.2 MG/ML ON CALL CATH LAB
INTRAVENOUS | Status: AC
  Filled 2012-07-20: qty 1

## 2012-07-20 MED ORDER — SODIUM CHLORIDE 0.9 % IV SOLN
INTRAVENOUS | Status: DC
  Administered 2012-07-20: 100 mL/h via INTRAVENOUS

## 2012-07-20 MED ORDER — VERAPAMIL HCL 2.5 MG/ML IV SOLN
INTRAVENOUS | Status: AC
  Filled 2012-07-20: qty 2

## 2012-07-20 MED ORDER — SODIUM CHLORIDE 0.9 % IV SOLN
INTRAVENOUS | Status: DC
  Administered 2012-07-20: 10 mL/h via INTRAVENOUS

## 2012-07-20 NOTE — Progress Notes (Signed)
Long discussion with patient and wife. They are very concerned about his recurrent syncopal episodes with prolonged unconsciousness. His cardiac status is stable. Will ask for inpatient neurology evaluation to determine whether other testing needs to be done. Pt will stay in hospital overnight. Hopefully his tests can be done as an outpatient and he can be discharged tomorrow.  Sherren Mocha 07/20/2012 7:01 PM

## 2012-07-20 NOTE — Care Management Note (Signed)
    Page 1 of 1   07/20/2012     8:58:56 AM   CARE MANAGEMENT NOTE 07/20/2012  Patient:  [name] [name], [name] [name]   Account Number:  [id number]  Date Initiated:  07/20/2012  Documentation initiated by:  Elissa Hefty  Subjective/Objective Assessment:   adm w syncope     Action/Plan:   lives w wife, pcp dr Shanon Brow tapper   Anticipated DC Date:     Anticipated DC Plan:        [place]  CM consult      Choice offered to / List presented to:             Status of service:   Medicare Important Message given?   (If response is "NO", the following Medicare IM given date fields will be blank) Date Medicare IM given:   Date Additional Medicare IM given:    Discharge Disposition:    Per UR Regulation:  Reviewed for med. necessity/level of care/duration of stay  If discussed at [place] of Stay Meetings, dates discussed:    Comments:  9/11 [place] rn,bsn [phone]

## 2012-07-20 NOTE — Progress Notes (Signed)
[place] for Heparin Indication: chest pain/ACS  No Known Allergies  Patient Measurements: Height: 5' 9"  (175.3 cm) Weight: 242 lb 4.8 oz (109.907 kg) IBW/kg (Calculated) : 70.7  Heparin Dosing Weight: 95 kg  Vital Signs: Temp: 97.8 F (36.6 C) (09/11 0400) Temp src: Oral (09/11 0400) BP: 91/55 mmHg (09/11 0400) Pulse Rate: 65  (09/11 0500)  Labs:  Basename 07/20/12 0525 07/19/12 2345 07/19/12 2028 07/19/12 1954 07/18/12 0205  HGB 12.8* 14.5 -- -- --  HCT 36.9* 39.9 -- 39.2 --  PLT 130* 130* -- 121* --  APTT -- -- -- -- --  LABPROT -- 14.7 -- -- 15.8*  INR -- 1.13 -- -- 1.23  HEPARINUNFRC 0.20* -- -- -- --  CREATININE -- 0.97 -- 0.99 1.08  CKTOTAL -- -- -- -- --  CKMB -- -- -- -- --  TROPONINI <0.30 <0.30 <0.30 -- --    Estimated Creatinine Clearance: 106.4 ml/min (by C-G formula based on Cr of 0.97).  Assessment: 56 yo male with chest pain for Heparin  Goal of Therapy:  Heparin level 0.3-0.7 units/ml Monitor platelets by anticoagulation protocol: Yes   Plan:  Heparin 2000 units IV bolus, then increase heparin 1750 units/hr Check heparin level in 6 hours.  Phillis Knack, PharmD, BCPS  07/20/2012 6:39 AM

## 2012-07-20 NOTE — CV Procedure (Signed)
   Cardiac Catheterization Procedure Note  Name: [name] [name] MRN: [id number] DOB: September 07, 1956  Procedure: Left Heart Cath, Selective Coronary Angiography, LV angiography  Indication: Chest pain possible unstable angina. Known history of coronary artery disease status post drug-eluting stent placement to the LAD.  Medications:  Sedation:  1 mg IV Versed, 50 mcg IV Fentanyl  Contrast:  65 ML Omnipaque   Procedural Details: The right wrist was prepped, draped, and anesthetized with 1% lidocaine. Using the modified Seldinger technique, a 5 French sheath was introduced into the right radial artery. 3 mg of verapamil was administered through the sheath, weight-based unfractionated heparin was administered intravenously. A STANDARD JUDKINS catheters was used for selective coronary angiography. A pigtail catheter was used for left ventriculography. Catheter exchanges were performed over an exchange length guidewire. There were no immediate procedural complications. A TR band was used for radial hemostasis at the completion of the procedure.  The patient was transferred to the post catheterization recovery area for further monitoring.  Procedural Findings:  Hemodynamics: AO:  106/68   mmHg LV:  106 /8    mmHg LVEDP: 11  mmHg  Coronary angiography: Coronary dominance: Right   Left Main:  Normal in size and free of significant disease.  Left Anterior Descending (LAD):  Normal in size with mild calcifications. No significant disease is noted proximally. It appears that there are 2 non overlapped stents in the midsegment. There is a 40-50 % in-stent restenosis in the more proximal stent. And a second stent, there is 30% in-stent restenosis at the distal edge. The distal LAD has diffuse 20% disease.  1st diagonal (D1):  Supplied by a ramus branch.  2nd diagonal (D2):  Normal in size with mild 20% ostial stenosis.  3rd diagonal (D3):  Very small in size.  First septal perforator has 50-60%  ostial stenosis.  Circumflex (LCx):  Normal in size and nondominant. The vessel has minor irregularities.  1st obtuse marginal:  Normal in size with 50-60% ostial stenosis.  2nd obtuse marginal:  Normal in size without significant disease.  3rd obtuse marginal:  Normal in size with minor irregularities.   Ramus Intermedius:  Small to medium in size with minor irregularities.  Right Coronary Artery: Normal in size and dominant. The vessel has minor irregularities with 20% distal disease.  posterior descending artery: Normal in size with minor irregularities.  posterior lateral branch:  Minor irregularities.  Left ventriculography: Left ventricular systolic function is normal , LVEF is estimated at 55-60 %, there is no significant mitral regurgitation   Final Conclusions:   1. Patent LAD stents with mild to moderate instent restenosis. Moderate disease in the OM1. No evidence of obstructive CAD. 2. Normal LV systolic function.  Recommendations:  Continue medical therapy.  [name] MD, [facility] 07/20/2012, 12:45 PM

## 2012-07-20 NOTE — H&P (View-Only) (Signed)
    Subjective:  No CP this am. No dyspnea.  Objective:  Vital Signs in the last 24 hours: Temp:  [97.5 F (36.4 C)-98.7 F (37.1 C)] 97.8 F (36.6 C) (09/11 0800) Pulse Rate:  [64-99] 66  (09/11 0800) Resp:  [8-21] 11  (09/11 0800) BP: (91-161)/(51-120) 103/64 mmHg (09/11 0800) SpO2:  [91 %-100 %] 100 % (09/11 0814) Weight:  [109.907 kg (242 lb 4.8 oz)] 109.907 kg (242 lb 4.8 oz) (09/10 2110)  Intake/Output from previous day: 09/10 0701 - 09/11 0700 In: 138 [I.V.:138] Out: 450 [Urine:450]  Physical Exam: Pt is alert and oriented, overweight male in NAD HEENT: normal Neck: JVP - normal Lungs: CTA bilaterally CV: RRR without murmur or gallop Abd: soft, NT, Positive BS, obese Ext: no C/C/E, distal pulses intact and equal Skin: warm/dry no rash   Lab Results:  Basename 07/20/12 0525 07/19/12 2345  WBC 4.1 3.6*  HGB 12.8* 14.5  PLT 130* 130*    Basename 07/19/12 2345 07/19/12 1954  NA 137 140  K 3.8 4.6  CL 102 104  CO2 26 28  GLUCOSE 178* 186*  BUN 16 17  CREATININE 0.97 0.99    Basename 07/20/12 0525 07/19/12 2345  TROPONINI <0.30 <0.30   Assessment/Plan:  1. Chest pain, substernal with syncope 2. Known CAD s/p multiple PCI procedures 3. DM, Type 2 4. HTN, essential  Data reviewed. He has ruled out. He just had normal Myoview a few days ago. However, he reports past history of normal stress testing just before he required revascularization. He is having progressive chest pain and has had multiple recent hospital evaluations btwn [facility] and [place] now on IV heparin and NTG. Favor cardiac cath and possible PCI. Reviewed risks, indications, and alternatives and he agrees to proceed.   No clear cause of syncope unless ischemia-mediated. He has normal LV function. No arrhythmia on tele. CT brain normal.  Sherren Mocha, M.D. 07/20/2012, 9:28 AM

## 2012-07-20 NOTE — Progress Notes (Addendum)
    Subjective:  No CP this am. No dyspnea.  Objective:  Vital Signs in the last 24 hours: Temp:  [97.5 F (36.4 C)-98.7 F (37.1 C)] 97.8 F (36.6 C) (09/11 0800) Pulse Rate:  [64-99] 66  (09/11 0800) Resp:  [8-21] 11  (09/11 0800) BP: (91-161)/(51-120) 103/64 mmHg (09/11 0800) SpO2:  [91 %-100 %] 100 % (09/11 0814) Weight:  [109.907 kg (242 lb 4.8 oz)] 109.907 kg (242 lb 4.8 oz) (09/10 2110)  Intake/Output from previous day: 09/10 0701 - 09/11 0700 In: 138 [I.V.:138] Out: 450 [Urine:450]  Physical Exam: Pt is alert and oriented, overweight male in NAD HEENT: normal Neck: JVP - normal Lungs: CTA bilaterally CV: RRR without murmur or gallop Abd: soft, NT, Positive BS, obese Ext: no C/C/E, distal pulses intact and equal Skin: warm/dry no rash   Lab Results:  Basename 07/20/12 0525 07/19/12 2345  WBC 4.1 3.6*  HGB 12.8* 14.5  PLT 130* 130*    Basename 07/19/12 2345 07/19/12 1954  NA 137 140  K 3.8 4.6  CL 102 104  CO2 26 28  GLUCOSE 178* 186*  BUN 16 17  CREATININE 0.97 0.99    Basename 07/20/12 0525 07/19/12 2345  TROPONINI <0.30 <0.30   Assessment/Plan:  1. Chest pain, substernal with syncope 2. Known CAD s/p multiple PCI procedures 3. DM, Type 2 4. HTN, essential  Data reviewed. He has ruled out. He just had normal Myoview a few days ago. However, he reports past history of normal stress testing just before he required revascularization. He is having progressive chest pain and has had multiple recent hospital evaluations btwn [facility] and [facility] now on IV heparin and NTG. Favor cardiac cath and possible PCI. Reviewed risks, indications, and alternatives and he agrees to proceed.   No clear cause of syncope unless ischemia-mediated. He has normal LV function. No arrhythmia on tele. CT brain normal.  Sherren Mocha, M.D. 07/20/2012, 9:28 AM

## 2012-07-20 NOTE — Interval H&P Note (Signed)
History and Physical Interval Note:  07/20/2012 12:16 PM  [name] [name]  has presented today for surgery, with the diagnosis of cp  The various methods of treatment have been discussed with the patient and family. After consideration of risks, benefits and other options for treatment, the patient has consented to  Procedure(s) (LRB) with comments: LEFT HEART CATHETERIZATION WITH CORONARY ANGIOGRAM (N/A) as a surgical intervention .  The patient's history has been reviewed, patient examined, no change in status, stable for surgery.  I have reviewed the patient's chart and labs.  Questions were answered to the patient's satisfaction.     [name]

## 2012-07-21 DIAGNOSIS — R55 Syncope and collapse: Secondary | ICD-10-CM

## 2012-07-21 DIAGNOSIS — I2510 Atherosclerotic heart disease of native coronary artery without angina pectoris: Secondary | ICD-10-CM

## 2012-07-21 LAB — CBC
HCT: 37.1 % — ABNORMAL LOW (ref 39.0–52.0)
Hemoglobin: 13.2 g/dL (ref 13.0–17.0)
MCH: 31.1 pg (ref 26.0–34.0)
MCHC: 35.6 g/dL (ref 30.0–36.0)
MCV: 87.5 fL (ref 78.0–100.0)
RDW: 12.9 % (ref 11.5–15.5)

## 2012-07-21 LAB — GLUCOSE, CAPILLARY
Glucose-Capillary: 146 mg/dL — ABNORMAL HIGH (ref 70–99)
Glucose-Capillary: 234 mg/dL — ABNORMAL HIGH (ref 70–99)
Glucose-Capillary: 153 mg/dL — ABNORMAL HIGH (ref 70–99)

## 2012-07-21 MED ORDER — ENOXAPARIN SODIUM 40 MG/0.4ML ~~LOC~~ SOLN
40.0000 mg | SUBCUTANEOUS | Status: DC
  Filled 2012-07-21 (×2): qty 0.4

## 2012-07-21 NOTE — H&P (Deleted)
[facility] CONSULT NOTE     Reason for Consult: syncope    HPI:    [name] [name] is an 56 y.o. male who presented to the hospital for evaluation of CP. In august he had a normal TEE and was recently ruled out for MI.  Per notes, he was admitted most recently on the 8th of September with complaints of chest pain that was constant with episodes of sharp pain radiating to the left neck. He ruled out for MI and EKG was non ischemic. D-dimer was normal and LE u/s was showed no DVT. He underwent Lexiscan Myoview which showed no ischemia and he was discharged to home.  Patient comes to [facility] 07[facility]17[facility]12 for due to having a syncopal episode. He states he had stood up, moved tot he couch, sat down to put a boot on and then experienced a "tinnel vision sensation" which is the last thing he remembers.  The last position he was in prior to passing out was bent over.  He awoke on the couch in a supine position approximately 30 minutes later.  He does have history of transient tachycardia but felt no arrythmia prior to passing out. This event did not consist of tongue biting or incontinence. He was disoriented for about 1-2 minutes but then realized where he was.  He continued to go to church where family members noted he was pale, diaphoretic. While at church he noted CP and was brought to [facility] hospital.  Patient went under a left heart catheterization 07-20-12 showing patent LAD and normal LV systolic function.   Past Medical History  Diagnosis Date  . Coronary artery disease     a. h/o LAD & OM stenting;  b. 2007 Cath: nonobs dzs, patent stents;  c. 07/2012 neg MV, EF 61%.  . Diabetes mellitus   . Asthma   . Hypertension     Past Surgical History  Procedure Date  . Neck surgery   . Carpal tunnel release   . Coronary stent placement   . Shoulder arthroscopy   . Knee surgery 4/13    History reviewed. No pertinent family history.  Social History:  reports that he has never  smoked. He does not have any smokeless tobacco history on file. He reports that he does not drink alcohol or use illicit drugs.  No Known Allergies  Medications:    Prior to Admission:  Prescriptions prior to admission  Medication Sig Dispense Refill  . aspirin EC 81 MG EC tablet Take 1 tablet (81 mg total) by mouth daily.      [name] atorvastatin (LIPITOR) 80 MG tablet Take 80 mg by mouth daily.       . citalopram (CELEXA) 20 MG tablet Take 20 mg by mouth daily.      . fenofibrate (TRICOR) 145 MG tablet Take 145 mg by mouth daily.      . Fluticasone-Salmeterol (ADVAIR) 250-50 MCG/DOSE AEPB Inhale 1 puff into the lungs every 12 (twelve) hours.      . gabapentin (NEURONTIN) 300 MG capsule Take 300 mg by mouth 3 (three) times daily.      . insulin aspart (NOVOLOG) 100 UNIT/ML injection Inject into the skin 3 (three) times daily before meals.      . insulin glargine (LANTUS) 100 UNIT/ML injection Inject into the skin 2 (two) times daily. 60 u am, 60 units pm      .  meloxicam (MOBIC) 15 MG tablet Take 15 mg by mouth daily.       . nitroGLYCERIN (NITROSTAT) 0.4 MG SL tablet Place 0.4 mg under the tongue every 5 (five) minutes as needed.      [name] omeprazole (PRILOSEC) 20 MG capsule Take 20 mg by mouth 2 (two) times daily.      . ramipril (ALTACE) 5 MG tablet Take 5 mg by mouth daily.      . traZODone (DESYREL) 100 MG tablet Take 1 tablet (100 mg total) by mouth at bedtime.  30 tablet  1  . DISCONTD: metFORMIN (GLUCOPHAGE) 1000 MG tablet Take 1,000 mg by mouth daily with breakfast. And 5108m in the evening       Scheduled:   . aspirin EC  81 mg Oral Daily  . atorvastatin  80 mg Oral Daily  . citalopram  20 mg Oral Daily  . fenofibrate  160 mg Oral Daily  . Fluticasone-Salmeterol  1 puff Inhalation Q12H  . gabapentin  300 mg Oral TID  . influenza  inactive virus vaccine  0.5 mL Intramuscular Tomorrow-1000  . insulin aspart  0-15 Units Subcutaneous TID WC  . insulin glargine  60 Units Subcutaneous  BID  . meloxicam  15 mg Oral Q breakfast  . metoprolol tartrate  12.5 mg Oral BID  . pantoprazole  40 mg Oral Q1200  . ramipril  5 mg Oral Daily  . traZODone  100 mg Oral QHS    Review of Systems - General ROS: negative for - chills, fatigue, fever or hot flashes Hematological and Lymphatic ROS: negative for - bruising, fatigue, jaundice or pallor Endocrine ROS: negative for - hair pattern changes, hot flashes, mood swings or skin changes Respiratory ROS: negative for - cough, hemoptysis, orthopnea or wheezing Cardiovascular ROS: negative for - dyspnea on exertion, orthopnea, palpitations or shortness of breath Gastrointestinal ROS: negative for - abdominal pain, appetite loss, blood in stools, diarrhea or hematemesis Musculoskeletal ROS: negative for - joint pain, joint stiffness, joint swelling or muscle pain Neurological ROS: positive for - syncope Dermatological ROS: negative for dry skin, pruritus and rash   Blood pressure 163/84, pulse 83, temperature 98.9 F (37.2 C), temperature source Oral, resp. rate 15, height 5' 9"  (1.753 m), weight 105.96 kg (233 lb 9.6 oz), SpO2 99.00%.   Neurologic Examination:   Mental Status: Alert, oriented X 3.  Speech fluent without evidence of aphasia. Able to follow 3 step commands without difficulty. Memory: intact for remote and recent Thought content appropriate Cranial Nerves: II-Visual fields grossly intact. III/IV/VI-Extraocular movements intact.  Pupils reactive bilaterally. Ptosis not present. Optic disc flat. V/VII-Smile symmetric VIII-grossly intact IX/X-normal gag XI-bilateral shoulder shrug XII-midline tongue extension Motor: 5/5 bilaterally with normal tone and bulk Sensory: Pinprick and light touch intact throughout, bilaterally Deep Tendon Reflexes:  Right: Upper Extremity   Left: Upper extremity   biceps (C-5 to C-6) 2/4   biceps (C-5 to C-6) 2/4 tricep (C7) 2/4    triceps (C7) 2/4 Brachioradialis (C6)  2/4  Brachioradialis (C6) 2/4  Lower Extremity Lower Extremity  quadriceps (L-2 to L-4) 2/4   quadriceps (L-2 to L-4) 2/4 Achilles (S1) 1/4   Achilles (S1) 1/4      Plantars:      Right:  mute     Left:  Downgoing Cerebellar: Normal finger-to-nose,  normal heel-to-shin test.     Lab Results  Component Value Date/Time   CHOL 96 07/18/2012  2:05 AM    Results for orders  placed during the hospital encounter of 07/19/12 (from the past 48 hour(s))  GLUCOSE, CAPILLARY     Status: Abnormal   Collection Time   07/19/12  5:05 PM      Component Value Range Comment   Glucose-Capillary 251 (*) 70 - 99 mg/dL   URINALYSIS, ROUTINE W REFLEX MICROSCOPIC     Status: Abnormal   Collection Time   07/19/12  6:26 PM      Component Value Range Comment   Color, Urine YELLOW  YELLOW    APPearance CLEAR  CLEAR    Specific Gravity, Urine 1.019  1.005 - 1.030    pH 7.0  5.0 - 8.0    Glucose, UA >1000 (*) NEGATIVE mg/dL    Hgb urine dipstick NEGATIVE  NEGATIVE    Bilirubin Urine NEGATIVE  NEGATIVE    Ketones, ur NEGATIVE  NEGATIVE mg/dL    Protein, ur NEGATIVE  NEGATIVE mg/dL    Urobilinogen, UA 1.0  0.0 - 1.0 mg/dL    Nitrite NEGATIVE  NEGATIVE    Leukocytes, UA NEGATIVE  NEGATIVE   URINE RAPID DRUG SCREEN (HOSP PERFORMED)     Status: Abnormal   Collection Time   07/19/12  6:26 PM      Component Value Range Comment   Opiates POSITIVE (*) NONE DETECTED    Cocaine NONE DETECTED  NONE DETECTED    Benzodiazepines NONE DETECTED  NONE DETECTED    Amphetamines NONE DETECTED  NONE DETECTED    Tetrahydrocannabinol NONE DETECTED  NONE DETECTED    Barbiturates NONE DETECTED  NONE DETECTED   URINE MICROSCOPIC-ADD ON     Status: Normal   Collection Time   07/19/12  6:26 PM      Component Value Range Comment   Squamous Epithelial / LPF RARE  RARE   CBC WITH DIFFERENTIAL     Status: Abnormal   Collection Time   07/19/12  7:54 PM      Component Value Range Comment   WBC 3.5 (*) 4.0 - 10.5 K/uL    RBC 4.46   4.22 - 5.81 MIL/uL    Hemoglobin 14.2  13.0 - 17.0 g/dL    HCT 39.2  39.0 - 52.0 %    MCV 87.9  78.0 - 100.0 fL    MCH 31.8  26.0 - 34.0 pg    MCHC 36.2 (*) 30.0 - 36.0 g/dL    RDW 13.1  11.5 - 15.5 %    Platelets 121 (*) 150 - 400 K/uL    Neutrophils Relative 43  43 - 77 %    Neutro Abs 1.5 (*) 1.7 - 7.7 K/uL    Lymphocytes Relative 44  12 - 46 %    Lymphs Abs 1.6  0.7 - 4.0 K/uL    Monocytes Relative 9  3 - 12 %    Monocytes Absolute 0.3  0.1 - 1.0 K/uL    Eosinophils Relative 4  0 - 5 %    Eosinophils Absolute 0.1  0.0 - 0.7 K/uL    Basophils Relative 1  0 - 1 %    Basophils Absolute 0.0  0.0 - 0.1 K/uL   COMPREHENSIVE METABOLIC PANEL     Status: Abnormal   Collection Time   07/19/12  7:54 PM      Component Value Range Comment   Sodium 140  135 - 145 mEq/L    Potassium 4.6  3.5 - 5.1 mEq/L    Chloride 104  96 - 112 mEq/L  CO2 28  19 - 32 mEq/L    Glucose, Bld 186 (*) 70 - 99 mg/dL    BUN 17  6 - 23 mg/dL    Creatinine, Ser 0.99  0.50 - 1.35 mg/dL    Calcium 10.1  8.4 - 10.5 mg/dL    Total Protein 7.2  6.0 - 8.3 g/dL    Albumin 3.7  3.5 - 5.2 g/dL    AST 20  0 - 37 U/L    ALT 21  0 - 53 U/L    Alkaline Phosphatase 33 (*) 39 - 117 U/L    Total Bilirubin 0.3  0.3 - 1.2 mg/dL    GFR calc non Af Amer >90  >90 mL/min    GFR calc Af Amer >90  >90 mL/min   TROPONIN I     Status: Normal   Collection Time   07/19/12  8:02 PM      Component Value Range Comment   Troponin I <0.30  <0.30 ng/mL   TROPONIN I     Status: Normal   Collection Time   07/19/12  8:28 PM      Component Value Range Comment   Troponin I <0.30  <0.30 ng/mL   MRSA PCR SCREENING     Status: Normal   Collection Time   07/19/12 11:17 PM      Component Value Range Comment   MRSA by PCR NEGATIVE  NEGATIVE   TROPONIN I     Status: Normal   Collection Time   07/19/12 11:45 PM      Component Value Range Comment   Troponin I <0.30  <0.30 ng/mL   PROTIME-INR     Status: Normal   Collection Time   07/19/12  11:45 PM      Component Value Range Comment   Prothrombin Time 14.7  11.6 - 15.2 seconds    INR 1.13  0.00 - 1.49   CBC WITH DIFFERENTIAL     Status: Abnormal   Collection Time   07/19/12 11:45 PM      Component Value Range Comment   WBC 3.6 (*) 4.0 - 10.5 K/uL    RBC 4.52  4.22 - 5.81 MIL/uL    Hemoglobin 14.5  13.0 - 17.0 g/dL    HCT 39.9  39.0 - 52.0 %    MCV 88.3  78.0 - 100.0 fL    MCH 32.1  26.0 - 34.0 pg    MCHC 36.3 (*) 30.0 - 36.0 g/dL    RDW 13.2  11.5 - 15.5 %    Platelets 130 (*) 150 - 400 K/uL    Neutrophils Relative 43  43 - 77 %    Neutro Abs 1.6 (*) 1.7 - 7.7 K/uL    Lymphocytes Relative 43  12 - 46 %    Lymphs Abs 1.6  0.7 - 4.0 K/uL    Monocytes Relative 9  3 - 12 %    Monocytes Absolute 0.3  0.1 - 1.0 K/uL    Eosinophils Relative 4  0 - 5 %    Eosinophils Absolute 0.1  0.0 - 0.7 K/uL    Basophils Relative 0  0 - 1 %    Basophils Absolute 0.0  0.0 - 0.1 K/uL   TSH     Status: Normal   Collection Time   07/19/12 11:45 PM      Component Value Range Comment   TSH 4.449  0.350 - 4.500 uIU/mL   COMPREHENSIVE METABOLIC PANEL  Status: Abnormal   Collection Time   07/19/12 11:45 PM      Component Value Range Comment   Sodium 137  135 - 145 mEq/L    Potassium 3.8  3.5 - 5.1 mEq/L DELTA CHECK NOTED   Chloride 102  96 - 112 mEq/L    CO2 26  19 - 32 mEq/L    Glucose, Bld 178 (*) 70 - 99 mg/dL    BUN 16  6 - 23 mg/dL    Creatinine, Ser 0.97  0.50 - 1.35 mg/dL    Calcium 9.9  8.4 - 10.5 mg/dL    Total Protein 7.2  6.0 - 8.3 g/dL    Albumin 3.7  3.5 - 5.2 g/dL    AST 28  0 - 37 U/L    ALT 20  0 - 53 U/L    Alkaline Phosphatase 32 (*) 39 - 117 U/L    Total Bilirubin 0.4  0.3 - 1.2 mg/dL    GFR calc non Af Amer >90  >90 mL/min    GFR calc Af Amer >90  >90 mL/min   GLUCOSE, CAPILLARY     Status: Abnormal   Collection Time   07/20/12 12:25 AM      Component Value Range Comment   Glucose-Capillary 167 (*) 70 - 99 mg/dL   GLUCOSE, CAPILLARY     Status: Abnormal    Collection Time   07/20/12  5:06 AM      Component Value Range Comment   Glucose-Capillary 235 (*) 70 - 99 mg/dL   HEPARIN LEVEL (UNFRACTIONATED)     Status: Abnormal   Collection Time   07/20/12  5:25 AM      Component Value Range Comment   Heparin Unfractionated 0.20 (*) 0.30 - 0.70 IU/mL   CBC     Status: Abnormal   Collection Time   07/20/12  5:25 AM      Component Value Range Comment   WBC 4.1  4.0 - 10.5 K/uL    RBC 4.14 (*) 4.22 - 5.81 MIL/uL    Hemoglobin 12.8 (*) 13.0 - 17.0 g/dL    HCT 36.9 (*) 39.0 - 52.0 %    MCV 89.1  78.0 - 100.0 fL    MCH 30.9  26.0 - 34.0 pg    MCHC 34.7  30.0 - 36.0 g/dL    RDW 13.5  11.5 - 15.5 %    Platelets 130 (*) 150 - 400 K/uL   TROPONIN I     Status: Normal   Collection Time   07/20/12  5:25 AM      Component Value Range Comment   Troponin I <0.30  <0.30 ng/mL   GLUCOSE, CAPILLARY     Status: Abnormal   Collection Time   07/20/12  8:02 AM      Component Value Range Comment   Glucose-Capillary 182 (*) 70 - 99 mg/dL   GLUCOSE, CAPILLARY     Status: Normal   Collection Time   07/20/12 11:50 AM      Component Value Range Comment   Glucose-Capillary 96  70 - 99 mg/dL   TROPONIN I     Status: Normal   Collection Time   07/20/12  2:16 PM      Component Value Range Comment   Troponin I <0.30  <0.30 ng/mL   GLUCOSE, CAPILLARY     Status: Abnormal   Collection Time   07/20/12  4:44 PM      Component Value Range Comment  Glucose-Capillary 105 (*) 70 - 99 mg/dL   GLUCOSE, CAPILLARY     Status: Abnormal   Collection Time   07/20/12  9:30 PM      Component Value Range Comment   Glucose-Capillary 207 (*) 70 - 99 mg/dL    Comment 1 Documented in Chart      Comment 2 Notify RN     CBC     Status: Abnormal   Collection Time   07/21/12  4:45 AM      Component Value Range Comment   WBC 3.2 (*) 4.0 - 10.5 K/uL    RBC 4.24  4.22 - 5.81 MIL/uL    Hemoglobin 13.2  13.0 - 17.0 g/dL    HCT 37.1 (*) 39.0 - 52.0 %    MCV 87.5  78.0 - 100.0 fL    MCH  31.1  26.0 - 34.0 pg    MCHC 35.6  30.0 - 36.0 g/dL    RDW 12.9  11.5 - 15.5 %    Platelets 114 (*) 150 - 400 K/uL PLATELET COUNT CONFIRMED BY SMEAR  GLUCOSE, CAPILLARY     Status: Normal   Collection Time   07/21/12  7:47 AM      Component Value Range Comment   Glucose-Capillary 86  70 - 99 mg/dL   GLUCOSE, CAPILLARY     Status: Abnormal   Collection Time   07/21/12  1:00 PM      Component Value Range Comment   Glucose-Capillary 146 (*) 70 - 99 mg/dL     Dg Chest 2 View  07/19/2012  *RADIOLOGY REPORT*  Clinical Data: Chest pain and shortness of breath.  CHEST - 2 VIEW  Comparison: One view chest 07/17/2012.  Findings: The heart size is normal.  The lungs are clear.  The visualized soft tissues and bony thorax are unremarkable.  IMPRESSION: Negative chest.   Original Report Authenticated By: Resa Miner. MATTERN, M.D.    Ct Head Wo Contrast  07/19/2012  *RADIOLOGY REPORT*  Clinical Data: Syncopal episode.  CT HEAD WITHOUT CONTRAST  Technique:  Contiguous axial images were obtained from the base of the skull through the vertex without contrast.  Comparison: 03/25/2012.  Findings: No acute intracranial abnormality is present. Specifically, there is no evidence for acute infarct, hemorrhage, mass, hydrocephalus, or extra-axial fluid collection.  The paranasal sinuses and mastoid air cells are clear.  The globes and orbits are intact.  The osseous skull is intact.  IMPRESSION: Negative CT of the head.   Original Report Authenticated By: Resa Miner. MATTERN, M.D.      Assessment/Plan:   56 YO male who presented to the ED after syncopal episode followed by chest pressure.  Cardiac catheterization showed normal EF and no significant stenosis.  As states above, syncopal episode presented with tunnel vision and short period of confusion after resolving.  Neurologic exam shows no localizing or lateralizing deficits. Etiology differential includes cardiogenic syncope secondary to transient  arrythmia versus (less likely) seizure. At present time cardiovascular work-up has been unremarkable.   Recommend: 1) MRI/MRA brain without contrast to evaluate intracranial structures.  2) EEG--may be done as a out patient 3) No driving, operating heavy machinery, perform activities at heights, swimming or participation in water activities until release by outpatient physician.  This has been discussed with patient.   Will discuss with Dr. Assunta Found PA-C [facility] [phone]  07/21/2012, 1:19 PM

## 2012-07-21 NOTE — Progress Notes (Signed)
Patient: [name] [name] Date of Encounter: 07/21/2012, 11:49 AM Admit date: 07/19/2012     Subjective  Mr. Vilar denies CP or SOB. Awaiting neurology consult for syncope, as ordered last night by Dr. Burt Knack.   Objective  Physical Exam: Vitals: BP 143/79  Pulse 64  Temp 97.8 F (36.6 C) (Oral)  Resp 15  Ht 5' 9"  (1.753 m)  Wt 233 lb 9.6 oz (105.96 kg)  BMI 34.50 kg/m2  SpO2 99% General: Well developed, well appearing 56 year old male in no acute distress. Neck: Supple. JVD not elevated. Lungs: Clear bilaterally to auscultation without wheezes, rales, or rhonchi. Breathing is unlabored. Heart: RRR S1 S2 without murmurs, rub or gallop.  Abdomen: Soft, non-distended. Extremities: No clubbing or cyanosis. No edema.  Distal pedal pulses are 2+ and equal bilaterally. Right radial site intact without bleeding or hematoma. Neuro: Alert and oriented X 3. Moves all extremities spontaneously. No focal deficits.  Intake/Output Intake/Output Summary (Last 24 hours) at 07/21/12 1149 Last data filed at 07/21/12 0800  Gross per 24 hour  Intake    686 ml  Output   1650 ml  Net   -964 ml   Inpatient Medications:  . aspirin EC  81 mg Oral Daily  . atorvastatin  80 mg Oral Daily  . citalopram  20 mg Oral Daily  . fenofibrate  160 mg Oral Daily  . fentaNYL      . Fluticasone-Salmeterol  1 puff Inhalation Q12H  . gabapentin  300 mg Oral TID  . heparin      . heparin      . influenza  inactive virus vaccine  0.5 mL Intramuscular Tomorrow-1000  . insulin aspart  0-15 Units Subcutaneous TID WC  . insulin glargine  60 Units Subcutaneous BID  . lidocaine      . meloxicam  15 mg Oral Q breakfast  . metoprolol tartrate  12.5 mg Oral BID  . midazolam      . nitroGLYCERIN      . pantoprazole  40 mg Oral Q1200  . ramipril  5 mg Oral Daily  . traZODone  100 mg Oral QHS  . verapamil       . sodium chloride 100 mL/hr at 07/20/12 1700  . nitroGLYCERIN 5 mcg/min (07/20/12 0900)    Labs:  [facility] 07/19/12 2345 07/19/12 1954  NA 137 140  K 3.8 4.6  CL 102 104  CO2 26 28  GLUCOSE 178* 186*  BUN 16 17  CREATININE 0.97 0.99  CALCIUM 9.9 10.1  MG -- --  PHOS -- --    Basename 07/19/12 2345 07/19/12 1954  AST 28 20  ALT 20 21  ALKPHOS 32* 33*  BILITOT 0.4 0.3  PROT 7.2 7.2  ALBUMIN 3.7 3.7   No results found for this basename: LIPASE:2,AMYLASE:2 in the last 72 hours  Basename 07/21/12 0445 07/20/12 0525 07/19/12 2345 07/19/12 1954  WBC 3.2* 4.1 -- --  NEUTROABS -- -- 1.6* 1.5*  HGB 13.2 12.8* -- --  HCT 37.1* 36.9* -- --  MCV 87.5 89.1 -- --  PLT 114* 130* -- --    Basename 07/20/12 1416 07/20/12 0525 07/19/12 2345 07/19/12 2028  CKTOTAL -- -- -- --  CKMB -- -- -- --  TROPONINI <0.30 <0.30 <0.30 <0.30   No components found with this basename: POCBNP:3 No results found for this basename: DDIMER in the last 72 hours  Basename 07/19/12 2345  TSH 4.449  T4TOTAL --  T3FREE --  THYROIDAB --    Radiology/Studies: Dg Chest 2 View  07/19/2012  *RADIOLOGY REPORT*  Clinical Data: Chest pain and shortness of breath.  CHEST - 2 VIEW  Comparison: One view chest 07/17/2012.  Findings: The heart size is normal.  The lungs are clear.  The visualized soft tissues and bony thorax are unremarkable.  IMPRESSION: Negative chest.   Original Report Authenticated By: Resa Miner. MATTERN, M.D.    Ct Head Wo Contrast  07/19/2012  *RADIOLOGY REPORT*  Clinical Data: Syncopal episode.  CT HEAD WITHOUT CONTRAST  Technique:  Contiguous axial images were obtained from the base of the skull through the vertex without contrast.  Comparison: 03/25/2012.  Findings: No acute intracranial abnormality is present. Specifically, there is no evidence for acute infarct, hemorrhage, mass, hydrocephalus, or extra-axial fluid collection.  The paranasal sinuses and mastoid air cells are clear.  The globes and orbits are intact.  The osseous skull is intact.  IMPRESSION: Negative CT of  the head.   Original Report Authenticated By: Resa Miner. MATTERN, M.D.    Nm Myocar Multi W/spect W/wall Motion / Ef  07/18/2012  This examination was dictated by the cardiologist who supervised the examination.  Please see that report for details.  Identification: the patient is a 56 year old with history of chest pain test to evaluate rule out ischemia  Stress data:  The patient underwent Lexiscan stress testing per protocol.  Baseline EKG showed sinus rhythm.  With Lexiscan infusion, there are no ST changes to suggest ischemia.  Nuclear data:  The patient was studied in 1-day rest stress protocol.  He was injected with 10 mCi technetium 99 labeled tetrofosmin at rest, 30 mCi technetium  99 labeled tetrofosmin at stress.  Images were reconstructed the short vertical and horizontal axes.  The stress images were motion corrected.  On review of the raw data there was some soft tissue attenuation (diaphragm, bowel activity)  Overall both rest and stress images there appeared to be normal perfusion.  On gating LVEF was calculated at 61%.  Impression:  Stress Myoview:  Electrically negative for ischemia. Myoview scan with normal perfusion.  No evidence for significant ischemia or scar.   LVEF on gating calculated 61%.   Original Report Authenticated By: [email or web address]    Cardiac catheterization 07/20/2012: Hemodynamics:  AO: 106/68 mmHg  LV: 106 /8 mmHg  LVEDP: 11 mmHg  Coronary angiography:  Coronary dominance: Right  Left Main: Normal in size and free of significant disease.  Left Anterior Descending (LAD): Normal in size with mild calcifications. No significant disease is noted proximally. It appears that there are 2 non overlapped stents in the midsegment. There is a 40-50 % in-stent restenosis in the more proximal stent. And a second stent, there is 30% in-stent restenosis at the distal edge. The distal LAD has diffuse 20% disease.  1st diagonal (D1): Supplied by a ramus branch.  2nd diagonal (D2):  Normal in size with mild 20% ostial stenosis.  3rd diagonal (D3): Very small in size.  First septal perforator has 50-60% ostial stenosis.  Circumflex (LCx): Normal in size and nondominant. The vessel has minor irregularities.  1st obtuse marginal: Normal in size with 50-60% ostial stenosis.  2nd obtuse marginal: Normal in size without significant disease.  3rd obtuse marginal: Normal in size with minor irregularities.  Ramus Intermedius: Small to medium in size with minor irregularities.  Right Coronary Artery: Normal in size and dominant. The vessel has minor irregularities with 20% distal disease.  posterior  descending artery: Normal in size with minor irregularities.  posterior lateral branch: Minor irregularities. Left ventriculography: Left ventricular systolic function is normal , LVEF is estimated at 55-60 %, there is no significant mitral regurgitation  Final Conclusions:  1. Patent LAD stents with mild to moderate instent restenosis. Moderate disease in the OM1. No evidence of obstructive CAD.  2. Normal LV systolic function.  Recommendations:  Continue medical therapy.   Telemetry: normal sinus rhythm with occasional PVCs; no arrhythmias   Assessment and Plan  1. CAD - stable by cath yesterday; CP now gone 2. Syncope - single episode; etiology unclear at this time; echo shows normal LV function, cath shows stable CAD and there have been no arrhythmias on tele; ? need for outpt 30-day event monitor; awaiting neurology consult as ordered by Dr. Burt Knack last night; appreciate their input  Dr. Rayann Heman to see and make further recommendations. Signed, EDMISTEN, BROOKE PA-C  I have seen, examined the patient, and reviewed the above assessment and plan.  Changes to above are made where necessary.  He is doing well today, without concerns.  CP has resolved.  No further dizziness, presyncope. EKG, Echo, and cath reviewed. No arrhythmias on telemetry. I appreciate neurology input. Will  transfer to telemetry today.  Awaiting results of MRI/MRA, and EEG.  No further CV testing planned at this point, except for 30 day event monitor at discharge. Hope to DC to home tomorrow if neuro workup is uneventful.  Co Sign: Thompson Grayer, MD 07/21/2012 3:56 PM

## 2012-07-21 NOTE — Consult Note (Addendum)
[facility] CONSULT [facility]     Reason for Consult: syncope    HPI:    [name] [name] is an 56 y.o. male who presented to the hospital for evaluation of CP. In august he had a normal TEE and was recently ruled out for MI.  Per notes, he was admitted most recently on the 8th of September with complaints of chest pain that was constant with episodes of sharp pain radiating to the left neck. He ruled out for MI and EKG was non ischemic. D-dimer was normal and LE u/s was showed no DVT. He underwent Lexiscan Myoview which showed no ischemia and he was discharged to home.  Patient comes to [facility] 07-20-12 for due to having a syncopal episode. He states he had stood up, moved tot he couch, sat down to put a boot on and then experienced a "tinnel vision sensation" which is the last thing he remembers.  The last position he was in prior to passing out was bent over.  He awoke on the couch in a supine position approximately 30 minutes later.  He does have history of transient tachycardia but felt no arrythmia prior to passing out. This event did not consist of tongue biting or incontinence. He was disoriented for about 1-2 minutes but then realized where he was.  He continued to go to church where family members noted he was pale, diaphoretic. While at church he noted CP and was brought to [facility] hospital.  Patient went under a left heart catheterization 07-20-12 showing patent LAD and normal LV systolic function.   Past Medical History  Diagnosis Date  . Coronary artery disease     a. h/o LAD & OM stenting;  b. 2007 Cath: nonobs dzs, patent stents;  c. 07/2012 neg MV, EF 61%.  . Diabetes mellitus   . Asthma   . Hypertension     Past Surgical History  Procedure Date  . Neck surgery   . Carpal tunnel release   . Coronary stent placement   . Shoulder arthroscopy   . Knee surgery 4/13    History reviewed. No pertinent family history.  Social History:  reports that he has  never smoked. He does not have any smokeless tobacco history on file. He reports that he does not drink alcohol or use illicit drugs.  No Known Allergies  Medications:    Prior to Admission:  Prescriptions prior to admission  Medication Sig Dispense Refill  . aspirin EC 81 MG EC tablet Take 1 tablet (81 mg total) by mouth daily.      [name] atorvastatin (LIPITOR) 80 MG tablet Take 80 mg by mouth daily.       . citalopram (CELEXA) 20 MG tablet Take 20 mg by mouth daily.      . fenofibrate (TRICOR) 145 MG tablet Take 145 mg by mouth daily.      . Fluticasone-Salmeterol (ADVAIR) 250-50 MCG/DOSE AEPB Inhale 1 puff into the lungs every 12 (twelve) hours.      . gabapentin (NEURONTIN) 300 MG capsule Take 300 mg by mouth 3 (three) times daily.      . insulin aspart (NOVOLOG) 100 UNIT/ML injection Inject into the skin 3 (three) times daily before meals.      . insulin glargine (LANTUS) 100 UNIT/ML injection Inject into the skin 2 (two) times daily. 60 u am, 60 units pm      .  meloxicam (MOBIC) 15 MG tablet Take 15 mg by mouth daily.       . nitroGLYCERIN (NITROSTAT) 0.4 MG SL tablet Place 0.4 mg under the tongue every 5 (five) minutes as needed.      [name] omeprazole (PRILOSEC) 20 MG capsule Take 20 mg by mouth 2 (two) times daily.      . ramipril (ALTACE) 5 MG tablet Take 5 mg by mouth daily.      . traZODone (DESYREL) 100 MG tablet Take 1 tablet (100 mg total) by mouth at bedtime.  30 tablet  1  . DISCONTD: metFORMIN (GLUCOPHAGE) 1000 MG tablet Take 1,000 mg by mouth daily with breakfast. And 520m in the evening       Scheduled:   . aspirin EC  81 mg Oral Daily  . atorvastatin  80 mg Oral Daily  . citalopram  20 mg Oral Daily  . fenofibrate  160 mg Oral Daily  . Fluticasone-Salmeterol  1 puff Inhalation Q12H  . gabapentin  300 mg Oral TID  . influenza  inactive virus vaccine  0.5 mL Intramuscular Tomorrow-1000  . insulin aspart  0-15 Units Subcutaneous TID WC  . insulin glargine  60 Units  Subcutaneous BID  . meloxicam  15 mg Oral Q breakfast  . metoprolol tartrate  12.5 mg Oral BID  . pantoprazole  40 mg Oral Q1200  . ramipril  5 mg Oral Daily  . traZODone  100 mg Oral QHS    Review of Systems - General ROS: negative for - chills, fatigue, fever or hot flashes Hematological and Lymphatic ROS: negative for - bruising, fatigue, jaundice or pallor Endocrine ROS: negative for - hair pattern changes, hot flashes, mood swings or skin changes Respiratory ROS: negative for - cough, hemoptysis, orthopnea or wheezing Cardiovascular ROS: negative for - dyspnea on exertion, orthopnea, palpitations or shortness of breath Gastrointestinal ROS: negative for - abdominal pain, appetite loss, blood in stools, diarrhea or hematemesis Musculoskeletal ROS: negative for - joint pain, joint stiffness, joint swelling or muscle pain Neurological ROS: positive for - syncope Dermatological ROS: negative for dry skin, pruritus and rash   Blood pressure 163/84, pulse 83, temperature 98.9 F (37.2 C), temperature source Oral, resp. rate 15, height 5' 9"  (1.753 m), weight 105.96 kg (233 lb 9.6 oz), SpO2 99.00%.   Neurologic Examination:  CV-Pulses palpable throughout Mental Status: Alert, oriented X 3.  Speech fluent without evidence of aphasia. Able to follow 3 step commands without difficulty. Memory: intact for remote and recent Thought content appropriate Cranial Nerves: II-Visual fields grossly intact. Discs flat bilaterally. III/IV/VI-Extraocular movements intact.  Pupils reactive bilaterally. Ptosis not present. Optic disc flat. V/VII-Smile symmetric VIII-grossly intact IX/X-normal gag XI-bilateral shoulder shrug XII-midline tongue extension Motor: 5/5 bilaterally with normal tone and bulk Sensory: Pinprick and light touch intact throughout, bilaterally Deep Tendon Reflexes:  Right: Upper Extremity   Left: Upper extremity   biceps (C-5 to C-6) 2/4   biceps (C-5 to C-6) 2/4 tricep  (C7) 2/4    triceps (C7) 2/4 Brachioradialis (C6) 2/4  Brachioradialis (C6) 2/4  Lower Extremity Lower Extremity  quadriceps (L-2 to L-4) 2/4   quadriceps (L-2 to L-4) 2/4 Achilles (S1) 1/4   Achilles (S1) 1/4      Plantars:      Right:  mute     Left:  Downgoing Cerebellar: Normal finger-to-nose,  normal heel-to-shin test.     Lab Results  Component Value Date/Time   CHOL 96 07/18/2012  2:05 AM  Results for orders placed during the hospital encounter of 07/19/12 (from the past 48 hour(s))  GLUCOSE, CAPILLARY     Status: Abnormal   Collection Time   07/19/12  5:05 PM      Component Value Range Comment   Glucose-Capillary 251 (*) 70 - 99 mg/dL   URINALYSIS, ROUTINE W REFLEX MICROSCOPIC     Status: Abnormal   Collection Time   07/19/12  6:26 PM      Component Value Range Comment   Color, Urine YELLOW  YELLOW    APPearance CLEAR  CLEAR    Specific Gravity, Urine 1.019  1.005 - 1.030    pH 7.0  5.0 - 8.0    Glucose, UA >1000 (*) NEGATIVE mg/dL    Hgb urine dipstick NEGATIVE  NEGATIVE    Bilirubin Urine NEGATIVE  NEGATIVE    Ketones, ur NEGATIVE  NEGATIVE mg/dL    Protein, ur NEGATIVE  NEGATIVE mg/dL    Urobilinogen, UA 1.0  0.0 - 1.0 mg/dL    Nitrite NEGATIVE  NEGATIVE    Leukocytes, UA NEGATIVE  NEGATIVE   URINE RAPID DRUG SCREEN (HOSP PERFORMED)     Status: Abnormal   Collection Time   07/19/12  6:26 PM      Component Value Range Comment   Opiates POSITIVE (*) NONE DETECTED    Cocaine NONE DETECTED  NONE DETECTED    Benzodiazepines NONE DETECTED  NONE DETECTED    Amphetamines NONE DETECTED  NONE DETECTED    Tetrahydrocannabinol NONE DETECTED  NONE DETECTED    Barbiturates NONE DETECTED  NONE DETECTED   URINE MICROSCOPIC-ADD ON     Status: Normal   Collection Time   07/19/12  6:26 PM      Component Value Range Comment   Squamous Epithelial / LPF RARE  RARE   CBC WITH DIFFERENTIAL     Status: Abnormal   Collection Time   07/19/12  7:54 PM      Component Value Range  Comment   WBC 3.5 (*) 4.0 - 10.5 K/uL    RBC 4.46  4.22 - 5.81 MIL/uL    Hemoglobin 14.2  13.0 - 17.0 g/dL    HCT 39.2  39.0 - 52.0 %    MCV 87.9  78.0 - 100.0 fL    MCH 31.8  26.0 - 34.0 pg    MCHC 36.2 (*) 30.0 - 36.0 g/dL    RDW 13.1  11.5 - 15.5 %    Platelets 121 (*) 150 - 400 K/uL    Neutrophils Relative 43  43 - 77 %    Neutro Abs 1.5 (*) 1.7 - 7.7 K/uL    Lymphocytes Relative 44  12 - 46 %    Lymphs Abs 1.6  0.7 - 4.0 K/uL    Monocytes Relative 9  3 - 12 %    Monocytes Absolute 0.3  0.1 - 1.0 K/uL    Eosinophils Relative 4  0 - 5 %    Eosinophils Absolute 0.1  0.0 - 0.7 K/uL    Basophils Relative 1  0 - 1 %    Basophils Absolute 0.0  0.0 - 0.1 K/uL   COMPREHENSIVE METABOLIC PANEL     Status: Abnormal   Collection Time   07/19/12  7:54 PM      Component Value Range Comment   Sodium 140  135 - 145 mEq/L    Potassium 4.6  3.5 - 5.1 mEq/L    Chloride 104  96 - 112  mEq/L    CO2 28  19 - 32 mEq/L    Glucose, Bld 186 (*) 70 - 99 mg/dL    BUN 17  6 - 23 mg/dL    Creatinine, Ser 0.99  0.50 - 1.35 mg/dL    Calcium 10.1  8.4 - 10.5 mg/dL    Total Protein 7.2  6.0 - 8.3 g/dL    Albumin 3.7  3.5 - 5.2 g/dL    AST 20  0 - 37 U/L    ALT 21  0 - 53 U/L    Alkaline Phosphatase 33 (*) 39 - 117 U/L    Total Bilirubin 0.3  0.3 - 1.2 mg/dL    GFR calc non Af Amer >90  >90 mL/min    GFR calc Af Amer >90  >90 mL/min   TROPONIN I     Status: Normal   Collection Time   07/19/12  8:02 PM      Component Value Range Comment   Troponin I <0.30  <0.30 ng/mL   TROPONIN I     Status: Normal   Collection Time   07/19/12  8:28 PM      Component Value Range Comment   Troponin I <0.30  <0.30 ng/mL   MRSA PCR SCREENING     Status: Normal   Collection Time   07/19/12 11:17 PM      Component Value Range Comment   MRSA by PCR NEGATIVE  NEGATIVE   TROPONIN I     Status: Normal   Collection Time   07/19/12 11:45 PM      Component Value Range Comment   Troponin I <0.30  <0.30 ng/mL   PROTIME-INR      Status: Normal   Collection Time   07/19/12 11:45 PM      Component Value Range Comment   Prothrombin Time 14.7  11.6 - 15.2 seconds    INR 1.13  0.00 - 1.49   CBC WITH DIFFERENTIAL     Status: Abnormal   Collection Time   07/19/12 11:45 PM      Component Value Range Comment   WBC 3.6 (*) 4.0 - 10.5 K/uL    RBC 4.52  4.22 - 5.81 MIL/uL    Hemoglobin 14.5  13.0 - 17.0 g/dL    HCT 39.9  39.0 - 52.0 %    MCV 88.3  78.0 - 100.0 fL    MCH 32.1  26.0 - 34.0 pg    MCHC 36.3 (*) 30.0 - 36.0 g/dL    RDW 13.2  11.5 - 15.5 %    Platelets 130 (*) 150 - 400 K/uL    Neutrophils Relative 43  43 - 77 %    Neutro Abs 1.6 (*) 1.7 - 7.7 K/uL    Lymphocytes Relative 43  12 - 46 %    Lymphs Abs 1.6  0.7 - 4.0 K/uL    Monocytes Relative 9  3 - 12 %    Monocytes Absolute 0.3  0.1 - 1.0 K/uL    Eosinophils Relative 4  0 - 5 %    Eosinophils Absolute 0.1  0.0 - 0.7 K/uL    Basophils Relative 0  0 - 1 %    Basophils Absolute 0.0  0.0 - 0.1 K/uL   TSH     Status: Normal   Collection Time   07/19/12 11:45 PM      Component Value Range Comment   TSH 4.449  0.350 - 4.500 uIU/mL   COMPREHENSIVE  METABOLIC PANEL     Status: Abnormal   Collection Time   07/19/12 11:45 PM      Component Value Range Comment   Sodium 137  135 - 145 mEq/L    Potassium 3.8  3.5 - 5.1 mEq/L DELTA CHECK NOTED   Chloride 102  96 - 112 mEq/L    CO2 26  19 - 32 mEq/L    Glucose, Bld 178 (*) 70 - 99 mg/dL    BUN 16  6 - 23 mg/dL    Creatinine, Ser 0.97  0.50 - 1.35 mg/dL    Calcium 9.9  8.4 - 10.5 mg/dL    Total Protein 7.2  6.0 - 8.3 g/dL    Albumin 3.7  3.5 - 5.2 g/dL    AST 28  0 - 37 U/L    ALT 20  0 - 53 U/L    Alkaline Phosphatase 32 (*) 39 - 117 U/L    Total Bilirubin 0.4  0.3 - 1.2 mg/dL    GFR calc non Af Amer >90  >90 mL/min    GFR calc Af Amer >90  >90 mL/min   GLUCOSE, CAPILLARY     Status: Abnormal   Collection Time   07/20/12 12:25 AM      Component Value Range Comment   Glucose-Capillary 167 (*) 70 - 99  mg/dL   GLUCOSE, CAPILLARY     Status: Abnormal   Collection Time   07/20/12  5:06 AM      Component Value Range Comment   Glucose-Capillary 235 (*) 70 - 99 mg/dL   HEPARIN LEVEL (UNFRACTIONATED)     Status: Abnormal   Collection Time   07/20/12  5:25 AM      Component Value Range Comment   Heparin Unfractionated 0.20 (*) 0.30 - 0.70 IU/mL   CBC     Status: Abnormal   Collection Time   07/20/12  5:25 AM      Component Value Range Comment   WBC 4.1  4.0 - 10.5 K/uL    RBC 4.14 (*) 4.22 - 5.81 MIL/uL    Hemoglobin 12.8 (*) 13.0 - 17.0 g/dL    HCT 36.9 (*) 39.0 - 52.0 %    MCV 89.1  78.0 - 100.0 fL    MCH 30.9  26.0 - 34.0 pg    MCHC 34.7  30.0 - 36.0 g/dL    RDW 13.5  11.5 - 15.5 %    Platelets 130 (*) 150 - 400 K/uL   TROPONIN I     Status: Normal   Collection Time   07/20/12  5:25 AM      Component Value Range Comment   Troponin I <0.30  <0.30 ng/mL   GLUCOSE, CAPILLARY     Status: Abnormal   Collection Time   07/20/12  8:02 AM      Component Value Range Comment   Glucose-Capillary 182 (*) 70 - 99 mg/dL   GLUCOSE, CAPILLARY     Status: Normal   Collection Time   07/20/12 11:50 AM      Component Value Range Comment   Glucose-Capillary 96  70 - 99 mg/dL   TROPONIN I     Status: Normal   Collection Time   07/20/12  2:16 PM      Component Value Range Comment   Troponin I <0.30  <0.30 ng/mL   GLUCOSE, CAPILLARY     Status: Abnormal   Collection Time   07/20/12  4:44 PM  Component Value Range Comment   Glucose-Capillary 105 (*) 70 - 99 mg/dL   GLUCOSE, CAPILLARY     Status: Abnormal   Collection Time   07/20/12  9:30 PM      Component Value Range Comment   Glucose-Capillary 207 (*) 70 - 99 mg/dL    Comment 1 Documented in Chart      Comment 2 Notify RN     CBC     Status: Abnormal   Collection Time   07/21/12  4:45 AM      Component Value Range Comment   WBC 3.2 (*) 4.0 - 10.5 K/uL    RBC 4.24  4.22 - 5.81 MIL/uL    Hemoglobin 13.2  13.0 - 17.0 g/dL    HCT 37.1 (*)  39.0 - 52.0 %    MCV 87.5  78.0 - 100.0 fL    MCH 31.1  26.0 - 34.0 pg    MCHC 35.6  30.0 - 36.0 g/dL    RDW 12.9  11.5 - 15.5 %    Platelets 114 (*) 150 - 400 K/uL PLATELET COUNT CONFIRMED BY SMEAR  GLUCOSE, CAPILLARY     Status: Normal   Collection Time   07/21/12  7:47 AM      Component Value Range Comment   Glucose-Capillary 86  70 - 99 mg/dL   GLUCOSE, CAPILLARY     Status: Abnormal   Collection Time   07/21/12  1:00 PM      Component Value Range Comment   Glucose-Capillary 146 (*) 70 - 99 mg/dL     Dg Chest 2 View  07/19/2012  *RADIOLOGY REPORT*  Clinical Data: Chest pain and shortness of breath.  CHEST - 2 VIEW  Comparison: One view chest 07/17/2012.  Findings: The heart size is normal.  The lungs are clear.  The visualized soft tissues and bony thorax are unremarkable.  IMPRESSION: Negative chest.   Original Report Authenticated By: Resa Miner. MATTERN, M.D.    Ct Head Wo Contrast  07/19/2012  *RADIOLOGY REPORT*  Clinical Data: Syncopal episode.  CT HEAD WITHOUT CONTRAST  Technique:  Contiguous axial images were obtained from the base of the skull through the vertex without contrast.  Comparison: 03/25/2012.  Findings: No acute intracranial abnormality is present. Specifically, there is no evidence for acute infarct, hemorrhage, mass, hydrocephalus, or extra-axial fluid collection.  The paranasal sinuses and mastoid air cells are clear.  The globes and orbits are intact.  The osseous skull is intact.  IMPRESSION: Negative CT of the head.   Original Report Authenticated By: Resa Miner. MATTERN, M.D.      Assessment/Plan:   56 YO male who presented to the ED after syncopal episode followed by chest pressure.  Cardiac catheterization showed normal EF and no significant stenosis.  Cardiac enzymes were normal. As states above, syncopal episode presented with tunnel vision and short period of confusion after resolving, however he believes he was unconscious for 30 minutes.   Neurologic exam shows no localizing or lateralizing deficits. Etiology differential includes cardiogenic syncope secondary to transient arrythmia versus seizure. At present time cardiovascular work-up has been unremarkable.   Recommend: 1) MRI/MRA brain without contrast to evaluate intracranial structures.  2) EEG 3) No driving, operating heavy machinery, perform activities at heights, swimming or participation in water activities until release by outpatient physician.  This has been discussed with patient.    Etta Quill PA-C [facility] [phone]  07/21/2012, 1:19 PM  Patient seen and examined.  Clinical course  and management discussed.  Necessary edits performed.  I agree with the above. Above recommendations may be performed as an outpatient if patient continues to remain stable.    Alexis Goodell, MD [facility] [phone]  07/21/2012  5:11 PM

## 2012-07-21 NOTE — Progress Notes (Signed)
Patient transferred to 2000. Report called to receiving rn. No complaints at this time.

## 2012-07-22 ENCOUNTER — Inpatient Hospital Stay (HOSPITAL_COMMUNITY): Payer: Medicare Other

## 2012-07-22 ENCOUNTER — Telehealth: Payer: Self-pay | Admitting: *Deleted

## 2012-07-22 ENCOUNTER — Other Ambulatory Visit: Payer: Self-pay | Admitting: *Deleted

## 2012-07-22 DIAGNOSIS — R55 Syncope and collapse: Secondary | ICD-10-CM

## 2012-07-22 MED ORDER — METOPROLOL TARTRATE 12.5 MG HALF TABLET: 12.5000 mg | ORAL_TABLET | Freq: Two times a day (BID) | ORAL | Status: DC

## 2012-07-22 NOTE — Discharge Summary (Signed)
CARDIOLOGY DISCHARGE SUMMARY    Patient ID: [name] [name],  MRN: [id number], DOB/AGE: 1956/01/24 56 y.o.  Admit date: 07/19/2012 Discharge date: 07/22/2012  Primary Care Physician: [name] Hughs, MD Primary Cardiologist: [name] Sleeper, MD  Primary Discharge Diagnosis:  1. Chest pain, resolved - CEs negative, cardiac cath this admission shows stable CAD 2. Syncope  Secondary Discharge Diagnoses:  1. CAD 2. HTN 3. Dyslipidemia 4. Asthma 5. DM 6. Obesity  Procedures This Admission:  1. Left heart catheterization 07/20/2012 Procedural Findings:  Hemodynamics:  AO: 106/68 mmHg  LV: 106 /8 mmHg  LVEDP: 11 mmHg  Coronary angiography:  Coronary dominance: Right  Left Main: Normal in size and free of significant disease.  Left Anterior Descending (LAD): Normal in size with mild calcifications. No significant disease is noted proximally. It appears that there are 2 non overlapped stents in the midsegment. There is a 40-50 % in-stent restenosis in the more proximal stent. And a second stent, there is 30% in-stent restenosis at the distal edge. The distal LAD has diffuse 20% disease.  1st diagonal (D1): Supplied by a ramus branch.  2nd diagonal (D2): Normal in size with mild 20% ostial stenosis.  3rd diagonal (D3): Very small in size.  First septal perforator has 50-60% ostial stenosis.  Circumflex (LCx): Normal in size and nondominant. The vessel has minor irregularities.  1st obtuse marginal: Normal in size with 50-60% ostial stenosis.  2nd obtuse marginal: Normal in size without significant disease.  3rd obtuse marginal: Normal in size with minor irregularities.  Ramus Intermedius: Small to medium in size with minor irregularities.  Right Coronary Artery: Normal in size and dominant. The vessel has minor irregularities with 20% distal disease.  posterior descending artery: Normal in size with minor irregularities.  posterior lateral branch: Minor irregularities. Left  ventriculography: Left ventricular systolic function is normal , LVEF is estimated at 55-60 %, there is no significant mitral regurgitation  Final Conclusions:  Patent LAD stents with mild to moderate instent restenosis. Moderate disease in the OM1. No evidence of obstructive CAD. Normal LV systolic function.  Recommendations:  Continue medical therapy. 2. EEG 07/22/2012 Results pending at the time of this dictation  History and Hospital Course:  [name] [name] is a 56 year old man with CAD, HTN, dyslipidemia, DM and asthma who was admitted on 07/19/2012 with chest pain and syncope. MI was ruled out with serial cardiac enzymes. Of note, he was recently discharged on 07/18/2012 after being admitted with chest pain. His CEs were negative. His D-dimer and LE Doppler US were negative. He underwent a Lexiscan Myoview which was negative for ischemia. Therefore, with recurrent chest pain prompting re-admission, definitive evaluation with a cardiac catheterization was recommended. He underwent left heart catheterization on 07/20/2012, with results as outlined above. Medical therapy for known CAD was continued. He had no further CP. He remained hemodynamically stable and afebrile. His echocardiogram shows normal LV function and there were no arrhythmias on telemetry. A neurology consultation was obtained for further recommendations regarding syncope. MRI/MRA of the the brain was negative. An EEG was performed although the results of this test are pending at the time of this dictation. Neurology felt he was stable for discharge to follow-up as an outpatient. [name] [name] has also been seen, examined and deemed stable for discharge today by [name] [name]. He will wear a 30-day event monitor and follow-up with [name] Serpe, PA-C at [facility] in [place], [place].   Discharge Vitals: Blood pressure 112/72, pulse 69, temperature  97.2 F (36.2 C), temperature source Oral, resp. rate 18, height 5' 9"  (1.753 m), weight 233 lb  9.6 oz (105.96 kg), SpO2 97%   Labs: Lab Results  Component Value Date   WBC 3.2* 07/21/2012   HGB 13.2 07/21/2012   HCT 37.1* 07/21/2012   MCV 87.5 07/21/2012   PLT 114* 07/21/2012     Lab 07/19/12 2345  NA 137  K 3.8  CL 102  CO2 26  BUN 16  CREATININE 0.97  CALCIUM 9.9  PROT 7.2  BILITOT 0.4  ALKPHOS 32*  ALT 20  AST 28  GLUCOSE 178*   Lab Results  Component Value Date   DDIMER 0.24 07/18/2012    Basename 07/19/12 2345  INR 1.13    Disposition:  The patient is being discharged in stable condition.  Follow-up: Follow-up Information    Follow up with [name], EUGENE, PA. On 08/19/2012. (At 1:20 PM)    Contact information:   [address]  [place], [place] [address] [phone]      Follow up with [facility]. (Our office has arranged for a 30-day heart monitor to be mailed directly to you. When you receive it, call our office for further instructions.)    Contact information:   [address]  [place], [place] [address] [phone]   Discharge Medications:    Medication List     As of 07/22/2012 12:58 PM    TAKE these medications         aspirin 81 MG EC tablet   Take 1 tablet (81 mg total) by mouth daily.      atorvastatin 80 MG tablet   Commonly known as: LIPITOR   Take 80 mg by mouth daily.      citalopram 20 MG tablet   Commonly known as: CELEXA   Take 20 mg by mouth daily.      fenofibrate 145 MG tablet   Commonly known as: TRICOR   Take 145 mg by mouth daily.      Fluticasone-Salmeterol 250-50 MCG/DOSE Aepb   Commonly known as: ADVAIR   Inhale 1 puff into the lungs every 12 (twelve) hours.      gabapentin 300 MG capsule   Commonly known as: NEURONTIN   Take 300 mg by mouth 3 (three) times daily.      insulin aspart 100 UNIT/ML injection   Commonly known as: novoLOG   Inject into the skin 3 (three) times daily before meals.      insulin glargine 100 UNIT/ML injection   Commonly known as: LANTUS   Inject into the  skin 2 (two) times daily. 60 u am, 60 units pm      meloxicam 15 MG tablet   Commonly known as: MOBIC   Take 15 mg by mouth daily.      metFORMIN 1000 MG tablet   Commonly known as: GLUCOPHAGE   Take 1 tablet (1,000 mg total) by mouth daily with breakfast. And 524m in the evening. HOLD 48 hours, restart on 07/23/2012.      metoprolol tartrate 12.5 mg Tabs   Commonly known as: LOPRESSOR   Take 0.5 tablets (12.5 mg total) by mouth 2 (two) times daily.      nitroGLYCERIN 0.4 MG SL tablet   Commonly known as: NITROSTAT   Place 0.4 mg under the tongue every 5 (five) minutes as needed.      omeprazole 20 MG capsule   Commonly known as: PRILOSEC  Take 20 mg by mouth 2 (two) times daily.      ramipril 5 MG tablet   Commonly known as: ALTACE   Take 5 mg by mouth daily.      traZODone 100 MG tablet   Commonly known as: DESYREL   Take 1 tablet (100 mg total) by mouth at bedtime.        Duration of Discharge Encounter: Greater than 30 minutes including physician time.  Manson Passey, PA-C 07/22/2012, 12:58 PM   [name] Grayer, MD

## 2012-07-22 NOTE — Progress Notes (Signed)
Routine EEG completed.  

## 2012-07-22 NOTE — Telephone Encounter (Signed)
Received call from [facility] to give orders for monitor.

## 2012-07-22 NOTE — Progress Notes (Signed)
[facility] PROGRESS NOTE    SUBJECTIVE   Patient has had no further syncopal episodes.  He is feeling "great" and eating his breakfast. His gait and station are normal with no dizziness when he stands up.   OBJECTIVE   Vital signs in last 24 hours: Temp:  [97.2 F (36.2 C)-99.2 F (37.3 C)] 97.2 F (36.2 C) (09/13 0524) Pulse Rate:  [65-88] 69  (09/13 0524) Resp:  [16-18] 18  (09/13 0524) BP: (112-163)/(72-84) 112/72 mmHg (09/13 0524) SpO2:  [96 %-99 %] 97 % (09/13 0524)  Intake/Output from previous day: 09/12 0701 - 09/13 0700 In: 720 [P.O.:720] Out: 2550 [Urine:2550] Intake/Output this shift:   Nutritional status: Cardiac  Past Medical History  Diagnosis Date  . Coronary artery disease     a. h/o LAD & OM stenting;  b. 2007 Cath: nonobs dzs, patent stents;  c. 07/2012 neg MV, EF 61%.  . Diabetes mellitus   . Asthma   . Hypertension     Neurologic ROS negative . Musculoskeletal ROS negative  Neurologic Exam:  Mental Status: Alert, oriented, thought content appropriate.  Speech fluent without evidence of aphasia.  Able to follow 3 step commands without difficulty. Cranial Nerves: II: Visual fields grossly normal, pupils equal, round, reactive to light and accommodation III,IV, VI: ptosis not present, extra-ocular motions intact bilaterally V,VII: smile symmetric, facial light touch sensation normal bilaterally VIII: hearing normal bilaterally IX,X: gag reflex present XI: bilateral shoulder shrug XII: midline tongue extension Motor: Right : Upper extremity   5/5    Left:     Upper extremity   5/5  Lower extremity   5/5     Lower extremity   5/5 Tone and bulk:normal tone throughout; no atrophy noted Sensory: Pinprick and light touch intact throughout, bilaterally Deep Tendon Reflexes: 2+ and symmetric throughout. AJ 1+ Plantars: Right: mute   Left: downgoing Cerebellar: normal finger-to-nose,  normal  heel-to-shin test Gait: normal gait CV: pulses palpable throughout     Lab Results: Lab Results  Component Value Date/Time   CHOL 96 07/18/2012  2:05 AM   Lipid Panel No results found for this basename: CHOL,TRIG,HDL,CHOLHDL,VLDL,LDLCALC in the last 72 hours  Studies/Results: No results found.  Medications:     Scheduled:   . aspirin EC  81 mg Oral Daily  . atorvastatin  80 mg Oral Daily  . citalopram  20 mg Oral Daily  . enoxaparin (LOVENOX) injection  40 mg Subcutaneous Q24H  . fenofibrate  160 mg Oral Daily  . Fluticasone-Salmeterol  1 puff Inhalation Q12H  . gabapentin  300 mg Oral TID  . influenza  inactive virus vaccine  0.5 mL Intramuscular Tomorrow-1000  . insulin aspart  0-15 Units Subcutaneous TID WC  . insulin glargine  60 Units Subcutaneous BID  . meloxicam  15 mg Oral Q breakfast  . metoprolol tartrate  12.5 mg Oral BID  . pantoprazole  40 mg Oral Q1200  . ramipril  5 mg Oral Daily  . traZODone  100 mg Oral QHS    Assessment/Plan:   56 YO male who presented to the ED after syncopal episode followed by chest pressure. Cardiac catheterization showed normal EF and no significant stenosis. Cardiac enzymes were normal. As states above, syncopal episode presented with tunnel vision  and short period of confusion after resolving, however he believes he was unconscious for 30 minutes. Neurologic exam shows no localizing or lateralizing deficits. Etiology differential includes cardiogenic syncope secondary to transient arrythmia versus seizure. At present time cardiovascular work-up has been unremarkable. MRI/MRA head show no focal abnormalities or stroke. EEG is pending. Recommend:  1) EEG --If EEG shows no focal irritability would not start AED at this time.   If episode continue after discharge, without clear cardiovascular etiology, patient would benefit from out patient EMU or ambulatory EEG.  3) No driving, operating heavy machinery, perform activities at heights,  swimming or participation in water activities until release by outpatient physician. This has been discussed with patient.     Etta Quill PA-C [facility] [phone]  07/22/2012, 8:05 AM

## 2012-07-22 NOTE — Progress Notes (Signed)
Patient: AMIR FICK Date of Encounter: 07/22/2012, 8:36 AM Admit date: 07/19/2012     Subjective  Mr. Gallaga denies CP or SOB. No dizziness, presyncope, syncope or neuro symptoms   Objective  Physical Exam: Vitals: BP 112/72  Pulse 69  Temp 97.2 F (36.2 C) (Oral)  Resp 18  Ht 5' 9"  (1.753 m)  Wt 233 lb 9.6 oz (105.96 kg)  BMI 34.50 kg/m2  SpO2 97% General: Well developed, well appearing 56 year old male in no acute distress. Neck: Supple. JVD not elevated. Lungs: Clear bilaterally to auscultation without wheezes, rales, or rhonchi. Breathing is unlabored. Heart: RRR S1 S2 without murmurs, rub or gallop.  Abdomen: Soft, non-distended. Extremities: No clubbing or cyanosis. No edema.  Distal pedal pulses are 2+ and equal bilaterally. Right radial site intact without bleeding or hematoma. Neuro: Alert and oriented X 3. Moves all extremities spontaneously. No focal deficits.  Intake/Output Intake/Output Summary (Last 24 hours) at 07/21/12 1149 Last data filed at 07/21/12 0800  Gross per 24 hour  Intake    686 ml  Output   1650 ml  Net   -964 ml   Inpatient Medications:  . aspirin EC  81 mg Oral Daily  . atorvastatin  80 mg Oral Daily  . citalopram  20 mg Oral Daily  . fenofibrate  160 mg Oral Daily  . fentaNYL      . Fluticasone-Salmeterol  1 puff Inhalation Q12H  . gabapentin  300 mg Oral TID  . heparin      . heparin      . influenza  inactive virus vaccine  0.5 mL Intramuscular Tomorrow-1000  . insulin aspart  0-15 Units Subcutaneous TID WC  . insulin glargine  60 Units Subcutaneous BID  . lidocaine      . meloxicam  15 mg Oral Q breakfast  . metoprolol tartrate  12.5 mg Oral BID  . midazolam      . nitroGLYCERIN      . pantoprazole  40 mg Oral Q1200  . ramipril  5 mg Oral Daily  . traZODone  100 mg Oral QHS  . verapamil       . sodium chloride 100 mL/hr at 07/20/12 1700  . nitroGLYCERIN 5 mcg/min (07/20/12 0900)   Labs:  [facility] 07/19/12 2345  07/19/12 1954  NA 137 140  K 3.8 4.6  CL 102 104  CO2 26 28  GLUCOSE 178* 186*  BUN 16 17  CREATININE 0.97 0.99  CALCIUM 9.9 10.1  MG -- --  PHOS -- --    Basename 07/19/12 2345 07/19/12 1954  AST 28 20  ALT 20 21  ALKPHOS 32* 33*  BILITOT 0.4 0.3  PROT 7.2 7.2  ALBUMIN 3.7 3.7   No results found for this basename: LIPASE:2,AMYLASE:2 in the last 72 hours  Basename 07/21/12 0445 07/20/12 0525 07/19/12 2345 07/19/12 1954  WBC 3.2* 4.1 -- --  NEUTROABS -- -- 1.6* 1.5*  HGB 13.2 12.8* -- --  HCT 37.1* 36.9* -- --  MCV 87.5 89.1 -- --  PLT 114* 130* -- --    Basename 07/20/12 1416 07/20/12 0525 07/19/12 2345 07/19/12 2028  CKTOTAL -- -- -- --  CKMB -- -- -- --  TROPONINI <0.30 <0.30 <0.30 <0.30   No components found with this basename: POCBNP:3 No results found for this basename: DDIMER in the last 72 hours  Basename 07/19/12 2345  TSH 4.449  T4TOTAL --  T3FREE --  THYROIDAB --  Cardiac catheterization 07/20/2012: Hemodynamics:  AO: 106/68 mmHg  LV: 106 /8 mmHg  LVEDP: 11 mmHg  Coronary angiography:  Coronary dominance: Right  Left Main: Normal in size and free of significant disease.  Left Anterior Descending (LAD): Normal in size with mild calcifications. No significant disease is noted proximally. It appears that there are 2 non overlapped stents in the midsegment. There is a 40-50 % in-stent restenosis in the more proximal stent. And a second stent, there is 30% in-stent restenosis at the distal edge. The distal LAD has diffuse 20% disease.  1st diagonal (D1): Supplied by a ramus branch.  2nd diagonal (D2): Normal in size with mild 20% ostial stenosis.  3rd diagonal (D3): Very small in size.  First septal perforator has 50-60% ostial stenosis.  Circumflex (LCx): Normal in size and nondominant. The vessel has minor irregularities.  1st obtuse marginal: Normal in size with 50-60% ostial stenosis.  2nd obtuse marginal: Normal in size without significant  disease.  3rd obtuse marginal: Normal in size with minor irregularities.  Ramus Intermedius: Small to medium in size with minor irregularities.  Right Coronary Artery: Normal in size and dominant. The vessel has minor irregularities with 20% distal disease.  posterior descending artery: Normal in size with minor irregularities.  posterior lateral branch: Minor irregularities. Left ventriculography: Left ventricular systolic function is normal , LVEF is estimated at 55-60 %, there is no significant mitral regurgitation  Final Conclusions:  1. Patent LAD stents with mild to moderate instent restenosis. Moderate disease in the OM1. No evidence of obstructive CAD.  2. Normal LV systolic function.  Recommendations:  Continue medical therapy.   Telemetry: normal sinus rhythm with occasional PVCs; no arrhythmias   Assessment and Plan  1. CAD - stable, symptoms resolved No changes 2. Syncope - appreciate neurology consult. MRI reviewed CV workup has been normal. Awaiting EEG today. IF EEG ok, will DC to home with outpatient follow-up.  I have stressed the importance of not driving for 6 months following unexplained LOC.  He says that no one has told him this, though neurology has documented this twice in their note. I was very clear and frank in my instructions that he must not drive.  He says that he will comply.  DC to home today 30 day event monitor Follow-up with [name] and [facility] cardiologist as scheduled.  Thompson Grayer, MD 07/22/2012 8:36 AM

## 2012-07-23 NOTE — Procedures (Signed)
EEG NUMBER:  REFERRING PHYSICIAN:  Dr. Tamala Julian.  HISTORY:  A 56 year old male with syncope, evaluated to rule out seizure.  MEDICATIONS:  Aspirin, Lipitor, Celexa, Lovenox, fenofibrate, Neurontin, NovoLog, Lantus, Mobic, Lopressor, Protonix, Altace, Desyrel.  CONDITIONS OF RECORDING:  This is a 16-channel EEG carried out with the patient in the awake state.  DESCRIPTION:  The waking background activity consists of a low-voltage, symmetrical, fairly well-organized 8 Hz alpha activity seen from the parieto-occipital and posterotemporal regions.  Low-voltage, fast activity, poorly organized was seen anteriorly and at times, superimposed on more posterior rhythms.  A mixture of theta and alpha was seen from these central and temporal regions.  The patient does not drowse or sleep.  Hyperventilation was performed and elicited in a mild- to-moderate buildup, but failed to elicit any abnormalities. Intermittent photic stimulation was performed and elicited a symmetrical driving response, but failed to elicit any abnormalities as well.  IMPRESSION:  This is a normal awake EEG.  COMMENT:  An EEG with the patient sleep deprived to elicit drowse and light sleep may be desirable to further elicit a possible seizure disorder.          ______________________________ Alexis Goodell, MD    GQ:QPYP D:  07/22/2012 16:58:40  T:  07/23/2012 95:09:32  Job #:  [id number]

## 2012-07-26 DIAGNOSIS — R55 Syncope and collapse: Secondary | ICD-10-CM

## 2012-08-19 ENCOUNTER — Ambulatory Visit (INDEPENDENT_AMBULATORY_CARE_PROVIDER_SITE_OTHER): Payer: Medicare Other | Admitting: Physician Assistant

## 2012-08-19 ENCOUNTER — Encounter: Payer: Self-pay | Admitting: Physician Assistant

## 2012-08-19 VITALS — BP 143/82 | HR 84 | Ht 69.0 in | Wt 244.0 lb

## 2012-08-19 DIAGNOSIS — I4949 Other premature depolarization: Secondary | ICD-10-CM

## 2012-08-19 DIAGNOSIS — I10 Essential (primary) hypertension: Secondary | ICD-10-CM

## 2012-08-19 DIAGNOSIS — E785 Hyperlipidemia, unspecified: Secondary | ICD-10-CM

## 2012-08-19 DIAGNOSIS — E119 Type 2 diabetes mellitus without complications: Secondary | ICD-10-CM

## 2012-08-19 DIAGNOSIS — I493 Ventricular premature depolarization: Secondary | ICD-10-CM | POA: Insufficient documentation

## 2012-08-19 DIAGNOSIS — R55 Syncope and collapse: Secondary | ICD-10-CM

## 2012-08-19 DIAGNOSIS — I2510 Atherosclerotic heart disease of native coronary artery without angina pectoris: Secondary | ICD-10-CM

## 2012-08-19 MED ORDER — METOPROLOL TARTRATE 25 MG PO TABS: 25.0000 mg | ORAL_TABLET | Freq: Two times a day (BID) | ORAL | Status: DC

## 2012-08-19 NOTE — Assessment & Plan Note (Signed)
Continue aggressive medical management, following recent catheterization revealing nonobstructive CAD with moderate LAD ISR. LV function normal. Patient presents with no exertional CP. Will schedule return visit in 4 months, at which time he will establish with Dr. Dola Argyle.

## 2012-08-19 NOTE — Assessment & Plan Note (Signed)
Followed by primary M.D. Aggressive management recommended with target LDL 70 or less, if feasible.

## 2012-08-19 NOTE — Assessment & Plan Note (Signed)
Frequent isolated PVCs noted during current event monitoring. Patient has normal LVF. Recently started on low-dose Lopressor, which I will increase to 25 twice a day for more aggressive suppression of PVCs.

## 2012-08-19 NOTE — Assessment & Plan Note (Signed)
Stable on current medication regimen

## 2012-08-19 NOTE — Assessment & Plan Note (Signed)
No further workup indicated. Patient had recent extensive evaluation at [facility], consisting of negative cardiac markers, d-dimer, and extensive neurologic evaluation including MRI/MRA and EEG. Moreover, outpatient event monitor has been negative for any definite dysrhythmia, including NSVT, or significant bradycardia. Also, patient has not had any recurrent symptoms. Therefore, we will discontinue further evaluation with the event monitor, and patient has been cleared to resume driving.

## 2012-08-19 NOTE — Progress Notes (Signed)
Primary Cardiologist:  HPI: Post hospital followup from [facility], status post presentation with CP and syncope. Negative cardiac markers, d-dimer level. Given recent negative stress test, recommendation was to proceed with coronary angiography.   -Cardiac catheterization: Nonobstructive CAD with mild/moderate LAD ISR; moderate OM1 disease; EF 55-60%. Continued medical therapy recommended.  Patient also seen by neurology for evaluation of syncope. MRI/MRA of brain was negative, EEG pending. He was referred for ongoing outpatient evaluation with a 30 day event monitor. This remains in progress.  Clinically, he denies any exertional CP. He denies any tachycardia palpitations, or recurrent syncope. He has not yet resumed driving, as instructed.   No Known Allergies  Current Outpatient Prescriptions  Medication Sig Dispense Refill  . aspirin EC 81 MG EC tablet Take 1 tablet (81 mg total) by mouth daily.      [name] atorvastatin (LIPITOR) 80 MG tablet Take 80 mg by mouth daily.       . citalopram (CELEXA) 20 MG tablet Take 20 mg by mouth daily.      . fenofibrate (TRICOR) 145 MG tablet Take 145 mg by mouth daily.      . Fluticasone-Salmeterol (ADVAIR) 250-50 MCG/DOSE AEPB Inhale 1 puff into the lungs every 12 (twelve) hours.      . furosemide (LASIX) 20 MG tablet       . gabapentin (NEURONTIN) 400 MG capsule Take 400 mg by mouth 3 (three) times daily.      . insulin aspart (NOVOLOG) 100 UNIT/ML injection Inject into the skin 3 (three) times daily before meals.      . insulin glargine (LANTUS) 100 UNIT/ML injection Inject into the skin 2 (two) times daily. 60 u am, 60 units pm       . meloxicam (MOBIC) 15 MG tablet Take 15 mg by mouth daily.       . metFORMIN (GLUCOPHAGE) 1000 MG tablet Take 1 tablet (1,000 mg total) by mouth daily with breakfast. And 553m in the evening. HOLD 48 hours, restart on 07/23/2012.  50 tablet  11  . metoprolol tartrate (LOPRESSOR) 12.5 mg TABS Take 0.5 tablets (12.5 mg total) by  mouth 2 (two) times daily.  60 tablet  3  . nitroGLYCERIN (NITROSTAT) 0.4 MG SL tablet Place 0.4 mg under the tongue every 5 (five) minutes as needed.      .Marland Kitchenomeprazole (PRILOSEC) 20 MG capsule Take 20 mg by mouth 2 (two) times daily.      . ramipril (ALTACE) 5 MG tablet Take 5 mg by mouth daily.      . traZODone (DESYREL) 100 MG tablet Take 1 tablet (100 mg total) by mouth at bedtime.  30 tablet  1    Past Medical History  Diagnosis Date  . Coronary artery disease     a. h/o LAD & OM stenting;  b. 2007 Cath: nonobs dzs, patent stents;  c. 07/2012 neg MV, EF 61%.  . Diabetes mellitus   . Asthma   . Hypertension     Past Surgical History  Procedure Date  . Neck surgery   . Carpal tunnel release   . Coronary stent placement   . Shoulder arthroscopy   . Knee surgery 4/13    History   Social History  . Marital Status: Married    Spouse Name: N/A    Number of Children: N/A  . Years of Education: N/A   Occupational History  . Not on file.   Social History Main Topics  . Smoking status: Never Smoker   .  Smokeless tobacco: Not on file  . Alcohol Use: No  . Drug Use: No  . Sexually Active: Not Currently   Other Topics Concern  . Not on file   Social History Narrative  . No narrative on file    No family history on file.  ROS: no nausea, vomiting; no fever, chills; no melena, hematochezia; no claudication  PHYSICAL EXAM: BP 143/82  Pulse 84  Ht 5' 9"  (1.753 m)  Wt 244 lb (110.678 kg)  BMI 36.03 kg/m2  SpO2 99% GENERAL: 56 year-old male; NAD HEENT: NCAT, PERRLA, EOMI; sclera clear; no xanthelasma NECK: no bruits; unable to assess JVD, secondary to neck girth; no TM LUNGS: CTA bilaterally CARDIAC: RRR (S1, S2); no significant murmurs; no rubs or gallops ABDOMEN: Protuberant EXTREMETIES: Stable R wrist with palpable pulse, no hematoma; no significant peripheral edema SKIN: warm/dry; no obvious rash/lesions MUSCULOSKELETAL: no joint deformity NEURO: no focal  deficit; NL affect 56 [place]  EKG:    ASSESSMENT & PLAN:  CAD, NATIVE VESSEL Continue aggressive medical management, following recent catheterization revealing nonobstructive CAD with moderate LAD ISR. LV function normal. Patient presents with no exertional CP. Will schedule return visit in 4 months, at which time he will establish with Dr. Dola Argyle.  Syncope No further workup indicated. Patient had recent extensive evaluation at [facility], consisting of negative cardiac markers, d-dimer, and extensive neurologic evaluation including MRI/MRA and EEG. Moreover, outpatient event monitor has been negative for any definite dysrhythmia, including NSVT, or significant bradycardia. Also, patient has not had any recurrent symptoms. Therefore, we will discontinue further evaluation with the event monitor, and patient has been cleared to resume driving.  Ventricular ectopy Frequent isolated PVCs noted during current event monitoring. Patient has normal LVF. Recently started on low-dose Lopressor, which I will increase to 25 twice a day for more aggressive suppression of PVCs.  HYPERTENSION, UNSPECIFIED Stable on current medication regimen  Diabetes mellitus Followed by primary M.D.  HYPERLIPIDEMIA-MIXED  Followed by primary M.D. Aggressive management recommended with target LDL 70 or less, if feasible.    Gene Cleota Pellerito, PAC

## 2012-08-19 NOTE — Assessment & Plan Note (Signed)
Followed by primary M.D.

## 2012-08-19 NOTE — Patient Instructions (Addendum)
   Can discontinue monitor and send back   Patient cleared to resume driving  Increase Lopressor to 72m twice a day  Continue all other current medications. Follow up in  4 months

## 2012-09-11 ENCOUNTER — Emergency Department (HOSPITAL_COMMUNITY)
Admission: EM | Admit: 2012-09-11 | Discharge: 2012-09-11 | Disposition: A | Discharge status: Home / Self Care | Payer: Medicare Other | Attending: Emergency Medicine | Admitting: Emergency Medicine

## 2012-09-11 ENCOUNTER — Encounter (HOSPITAL_COMMUNITY): Payer: Self-pay | Admitting: *Deleted

## 2012-09-11 DIAGNOSIS — J45909 Unspecified asthma, uncomplicated: Secondary | ICD-10-CM | POA: Insufficient documentation

## 2012-09-11 DIAGNOSIS — S86012A Strain of left Achilles tendon, initial encounter: Secondary | ICD-10-CM

## 2012-09-11 DIAGNOSIS — Z79899 Other long term (current) drug therapy: Secondary | ICD-10-CM | POA: Insufficient documentation

## 2012-09-11 DIAGNOSIS — X500XXA Overexertion from strenuous movement or load, initial encounter: Secondary | ICD-10-CM | POA: Insufficient documentation

## 2012-09-11 DIAGNOSIS — Y929 Unspecified place or not applicable: Secondary | ICD-10-CM | POA: Insufficient documentation

## 2012-09-11 DIAGNOSIS — E119 Type 2 diabetes mellitus without complications: Secondary | ICD-10-CM | POA: Insufficient documentation

## 2012-09-11 DIAGNOSIS — S93499A Sprain of other ligament of unspecified ankle, initial encounter: Secondary | ICD-10-CM | POA: Insufficient documentation

## 2012-09-11 DIAGNOSIS — Z8673 Personal history of transient ischemic attack (TIA), and cerebral infarction without residual deficits: Secondary | ICD-10-CM | POA: Insufficient documentation

## 2012-09-11 DIAGNOSIS — Y939 Activity, unspecified: Secondary | ICD-10-CM | POA: Insufficient documentation

## 2012-09-11 DIAGNOSIS — S96819A Strain of other specified muscles and tendons at ankle and foot level, unspecified foot, initial encounter: Secondary | ICD-10-CM | POA: Insufficient documentation

## 2012-09-11 DIAGNOSIS — I10 Essential (primary) hypertension: Secondary | ICD-10-CM | POA: Insufficient documentation

## 2012-09-11 DIAGNOSIS — Z7982 Long term (current) use of aspirin: Secondary | ICD-10-CM | POA: Insufficient documentation

## 2012-09-11 DIAGNOSIS — Z794 Long term (current) use of insulin: Secondary | ICD-10-CM | POA: Insufficient documentation

## 2012-09-11 DIAGNOSIS — I2510 Atherosclerotic heart disease of native coronary artery without angina pectoris: Secondary | ICD-10-CM | POA: Insufficient documentation

## 2012-09-11 HISTORY — DX: Transient cerebral ischemic attack, unspecified: G45.9

## 2012-09-11 MED ORDER — HYDROCODONE-ACETAMINOPHEN 5-325 MG PO TABS: ORAL_TABLET | ORAL | Status: DC

## 2012-09-11 MED ORDER — HYDROCODONE-ACETAMINOPHEN 5-325 MG PO TABS
1.0000 | ORAL_TABLET | Freq: Once | ORAL | Status: AC
  Administered 2012-09-11: 1 via ORAL
  Filled 2012-09-11: qty 1

## 2012-09-11 NOTE — ED Notes (Signed)
Reports Achilles tendon injury (left) 2 months ago; states this improved, but pain began again yesterday.  Denies recent injury.

## 2012-09-11 NOTE — ED Provider Notes (Signed)
History     CSN: [id number]  Arrival date & time 09/11/12  1750   First MD Initiated Contact with Patient 09/11/12 1808      Chief Complaint  Patient presents with  . Leg Pain    (Consider location/radiation/quality/duration/timing/severity/associated sxs/prior treatment) HPI Comments: Pt denies any known injury to L achilles tendon.  He had  Pain in same area ~ 2 months ago and saw dr. Alphonzo Cruise who put him in a cam walker and crutches.    He wore the boot x 1 month and the pain improved.  He stopped wearing it ~ 2 weeks ago and has recently begun having pain again.  + limping.  Has an appt with dr. Alphonzo Cruise on nov 11.  Patient is a 56 y.o. male presenting with leg pain. The history is provided by the patient. No language interpreter was used.  Leg Pain     Past Medical History  Diagnosis Date  . Coronary artery disease     a. h/o LAD & OM stenting;  b. 2007 Cath: nonobs dzs, patent stents;  c. 07/2012 neg MV, EF 61%.  . Diabetes mellitus   . Asthma   . Hypertension   . TIA (transient ischemic attack)     Past Surgical History  Procedure Date  . Neck surgery   . Carpal tunnel release   . Coronary stent placement   . Shoulder arthroscopy   . Knee surgery 4/13  . Elbow surgery     No family history on file.  History  Substance Use Topics  . Smoking status: Never Smoker   . Smokeless tobacco: Not on file  . Alcohol Use: No      Review of Systems  Constitutional: Negative for fever.  Musculoskeletal:       Achilles pain   Skin: Negative for wound.  All other systems reviewed and are negative.    Allergies  Review of patient's allergies indicates no known allergies.  Home Medications   Current Outpatient Rx  Name  Route  Sig  Dispense  Refill  . ASPIRIN 81 MG PO TBEC   Oral   Take 1 tablet (81 mg total) by mouth daily.         . ATORVASTATIN CALCIUM 80 MG PO TABS   Oral   Take 80 mg by mouth daily.          [name] CITALOPRAM HYDROBROMIDE 20 MG  PO TABS   Oral   Take 20 mg by mouth daily.         . FENOFIBRATE 145 MG PO TABS   Oral   Take 145 mg by mouth daily.         [name] FLUTICASONE-SALMETEROL 250-50 MCG/DOSE IN AEPB   Inhalation   Inhale 1 puff into the lungs every 12 (twelve) hours.         . FUROSEMIDE 20 MG PO TABS               . GABAPENTIN 400 MG PO CAPS   Oral   Take 400 mg by mouth 3 (three) times daily.         [name] HYDROCODONE-ACETAMINOPHEN 5-325 MG PO TABS      One tab po q 4-6 hrs prn pain   20 tablet   0   . INSULIN ASPART 100 UNIT/ML [place] SOLN   Subcutaneous   Inject into the skin 3 (three) times daily before meals.         . INSULIN GLARGINE  100 UNIT/ML [place] SOLN   Subcutaneous   Inject into the skin 2 (two) times daily. 60 u am, 60 units pm          . MELOXICAM 15 MG PO TABS   Oral   Take 15 mg by mouth daily.          [name] METFORMIN HCL 1000 MG PO TABS   Oral   Take 1 tablet (1,000 mg total) by mouth daily with breakfast. And 560m in the evening. HOLD 48 hours, restart on 07/23/2012.   50 tablet   11   . METOPROLOL TARTRATE 25 MG PO TABS   Oral   Take 1 tablet (25 mg total) by mouth 2 (two) times daily.   60 tablet   6     Dose increased 08/19/2012.   .Marland KitchenNITROGLYCERIN 0.4 MG SL SUBL   Sublingual   Place 0.4 mg under the tongue every 5 (five) minutes as needed.         .Marland KitchenOMEPRAZOLE 20 MG PO CPDR   Oral   Take 20 mg by mouth 2 (two) times daily.         .Marland KitchenRAMIPRIL 5 MG PO TABS   Oral   Take 5 mg by mouth daily.         . TRAZODONE HCL 100 MG PO TABS   Oral   Take 1 tablet (100 mg total) by mouth at bedtime.   30 tablet   1     BP 167/84  Pulse 84  Temp 98.3 F (36.8 C) (Oral)  Resp 20  Ht 5' 8"  (1.727 m)  Wt 243 lb (110.224 kg)  BMI 36.95 kg/m2  SpO2 100%  Physical Exam  Nursing note and vitals reviewed. Constitutional: He is oriented to person, place, and time. He appears well-developed and well-nourished.  HENT:  Head: Normocephalic and  atraumatic.  Eyes: EOM are normal.  Neck: Normal range of motion.  Cardiovascular: Normal rate, regular rhythm, normal heart sounds and intact distal pulses.   Pulmonary/Chest: Effort normal and breath sounds normal. No respiratory distress.  Abdominal: Soft. He exhibits no distension. There is no tenderness.  Musculoskeletal: He exhibits tenderness.       Right ankle: He exhibits decreased range of motion. He exhibits no swelling, no ecchymosis, no deformity, no laceration and normal pulse. tenderness.       Achilles pain and PT.  Thompson negative.  Neurological: He is alert and oriented to person, place, and time.  Skin: Skin is warm and dry.  Psychiatric: He has a normal mood and affect. Judgment normal.    ED Course  Procedures (including critical care time)  Labs Reviewed - No data to display No results found.   1. Strain of left Achilles tendon       MDM  Cam walker and crutches Ice rx-hydrocodone, 20 F/u with dr. mMerril Abbe PA 09/12/12 [phone]

## 2012-09-13 ENCOUNTER — Other Ambulatory Visit: Payer: Self-pay | Admitting: Cardiology

## 2012-09-13 NOTE — ED Provider Notes (Signed)
Medical screening examination/treatment/procedure(s) were performed by non-physician practitioner and as supervising physician I was immediately available for consultation/collaboration.   Ezequiel Essex, MD 09/13/12 1118

## 2012-09-16 ENCOUNTER — Other Ambulatory Visit: Payer: Self-pay | Admitting: *Deleted

## 2012-09-16 MED ORDER — TRAZODONE HCL 100 MG PO TABS: 100.0000 mg | ORAL_TABLET | Freq: Every day | ORAL | Status: DC

## 2012-09-16 NOTE — Telephone Encounter (Signed)
traZODone (DESYREL) 100 MG tablet requesting a refill to [facility]

## 2012-09-26 ENCOUNTER — Telehealth: Payer: Self-pay | Admitting: Cardiology

## 2012-09-26 NOTE — Telephone Encounter (Signed)
Just left Dr. Rayna Sexton office and he was instructed to let us know of following symptoms he has been experiencing  - patient has been having chest pains with fluttering sensation enough to take his breath away. Just started week ago.   [phone]

## 2012-09-26 NOTE — Telephone Encounter (Signed)
Left message to return call 

## 2012-09-27 NOTE — Telephone Encounter (Signed)
Discussed below with patient.  States episode only last a few minutes & does notice brief SOB during that time.  Does not notice increased fatigue, chest pain, or SOB with exertion.  Episodes just happen randomly.  OV scheduled for Thursday, November 21 with Dr. Stanford Breed.  Advised to go to ED for worsening symptoms.  Patient verbalized understanding.

## 2012-09-29 ENCOUNTER — Encounter: Payer: Self-pay | Admitting: Cardiology

## 2012-09-29 ENCOUNTER — Ambulatory Visit (INDEPENDENT_AMBULATORY_CARE_PROVIDER_SITE_OTHER): Payer: Medicare Other | Admitting: Cardiology

## 2012-09-29 VITALS — BP 148/80 | HR 85 | Ht 69.0 in | Wt 244.8 lb

## 2012-09-29 DIAGNOSIS — E785 Hyperlipidemia, unspecified: Secondary | ICD-10-CM

## 2012-09-29 DIAGNOSIS — R55 Syncope and collapse: Secondary | ICD-10-CM

## 2012-09-29 DIAGNOSIS — I2510 Atherosclerotic heart disease of native coronary artery without angina pectoris: Secondary | ICD-10-CM

## 2012-09-29 DIAGNOSIS — I10 Essential (primary) hypertension: Secondary | ICD-10-CM

## 2012-09-29 DIAGNOSIS — R079 Chest pain, unspecified: Secondary | ICD-10-CM

## 2012-09-29 NOTE — Progress Notes (Signed)
HPI: Pleasant male for fu of CAD. Patient admitted in September 2013 with chest pain and syncope. Cardiac markers and d-dimer negative. Lower extremity venous Dopplers negative. Myoview was negative. However he continued to have chest pain. Cardiac catheterization revealed a 40-50% in-stent restenosis in the mid LAD. There was a 50-60% ostial first septal perforator. There was a 50-60% ostial marginal. Ejection fraction was 55-60%. Medical therapy recommended. Echocardiogram showed normal LV function. MRI/MRA of the brain was negative. EEG pending. Event monitor showed PVCs but no other significant arrhythmia. Since he was last seen, he describes occasional chest pain. These are in the left breast area and described as a sharp pain. It lasts 2-5 seconds and resolve spontaneously. No associated symptoms. No radiation. Not pleuritic, positional or exertional. Some dyspnea on exertion but no orthopnea, PND or pedal edema.  Current Outpatient Prescriptions  Medication Sig Dispense Refill  . aspirin EC 81 MG EC tablet Take 1 tablet (81 mg total) by mouth daily.      [name] atorvastatin (LIPITOR) 80 MG tablet Take 80 mg by mouth daily.       . citalopram (CELEXA) 20 MG tablet Take 20 mg by mouth daily.      . fenofibrate (TRICOR) 145 MG tablet Take 145 mg by mouth daily.      . Fluticasone-Salmeterol (ADVAIR) 250-50 MCG/DOSE AEPB Inhale 1 puff into the lungs every 12 (twelve) hours.      . furosemide (LASIX) 20 MG tablet Take 20 mg by mouth daily.       [name] gabapentin (NEURONTIN) 400 MG capsule Take 400 mg by mouth 3 (three) times daily.      . insulin aspart (NOVOLOG) 100 UNIT/ML injection Inject into the skin 3 (three) times daily before meals.      . insulin glargine (LANTUS) 100 UNIT/ML injection Inject into the skin 2 (two) times daily. 60 u am, 60 units pm       . meloxicam (MOBIC) 15 MG tablet Take 15 mg by mouth daily.       . metFORMIN (GLUCOPHAGE) 1000 MG tablet Take 1 tablet (1,000 mg total) by mouth  daily with breakfast. And 54m in the evening. HOLD 48 hours, restart on 07/23/2012.  50 tablet  11  . metoprolol tartrate (LOPRESSOR) 25 MG tablet Take 1 tablet (25 mg total) by mouth 2 (two) times daily.  60 tablet  6  . nitroGLYCERIN (NITROSTAT) 0.4 MG SL tablet Place 0.4 mg under the tongue every 5 (five) minutes as needed.      .Marland Kitchenomeprazole (PRILOSEC) 20 MG capsule Take 20 mg by mouth 2 (two) times daily.      . ramipril (ALTACE) 5 MG tablet Take 5 mg by mouth daily.      . traZODone (DESYREL) 100 MG tablet Take 1 tablet (100 mg total) by mouth at bedtime.  30 tablet  0     Past Medical History  Diagnosis Date  . Coronary artery disease     a. h/o LAD & OM stenting;  b. 2007 Cath: nonobs dzs, patent stents;  c. 07/2012 neg MV, EF 61%.  . Diabetes mellitus   . Asthma   . Hypertension   . TIA (transient ischemic attack)     Past Surgical History  Procedure Date  . Neck surgery   . Carpal tunnel release   . Coronary stent placement   . Shoulder arthroscopy   . Knee surgery 4/13  . Elbow surgery     History  Social History  . Marital Status: Married    Spouse Name: N/A    Number of Children: N/A  . Years of Education: N/A   Occupational History  . Not on file.   Social History Main Topics  . Smoking status: Never Smoker   . Smokeless tobacco: Not on file  . Alcohol Use: No  . Drug Use: No  . Sexually Active: Not Currently   Other Topics Concern  . Not on file   Social History Narrative  . No narrative on file    ROS: no fevers or chills, productive cough, hemoptysis, dysphasia, odynophagia, melena, hematochezia, dysuria, hematuria, rash, seizure activity, orthopnea, PND, pedal edema, claudication. Remaining systems are negative.  Physical Exam: Well-developed well-nourished in no acute distress.  Skin is warm and dry.  HEENT is normal.  Neck is supple.  Chest is clear to auscultation with normal expansion.  Cardiovascular exam is regular rate and rhythm.   Abdominal exam nontender or distended. No masses palpated. Extremities show no edema. neuro grossly intact  ECG sinus rhythm with occasional PVC. No significant ST changes. Cannot rule out prior anterior infarct.

## 2012-09-29 NOTE — Patient Instructions (Signed)
Continue all current medications. Follow up in  3 months 

## 2012-09-29 NOTE — Assessment & Plan Note (Signed)
Continue present blood pressure medications. 

## 2012-09-29 NOTE — Assessment & Plan Note (Signed)
Continue aspirin and statin. 

## 2012-09-29 NOTE — Assessment & Plan Note (Signed)
Continue statin. 

## 2012-09-29 NOTE — Assessment & Plan Note (Signed)
Symptoms are atypical and not consistent with cardiac pain. Electrocardiogram shows no ST changes. Recent catheterization shows no obstructive coronary disease. I will not pursue further cardiac evaluation. Followup with Dr. Ron Parker in 3-4 months as previously scheduled.

## 2012-09-29 NOTE — Assessment & Plan Note (Signed)
No recurrent episodes.

## 2012-10-17 ENCOUNTER — Other Ambulatory Visit: Payer: Self-pay | Admitting: *Deleted

## 2012-10-17 MED ORDER — TRAZODONE HCL 100 MG PO TABS: 100.0000 mg | ORAL_TABLET | Freq: Every day | ORAL | Status: DC

## 2012-12-04 DIAGNOSIS — R079 Chest pain, unspecified: Secondary | ICD-10-CM

## 2012-12-05 DIAGNOSIS — R072 Precordial pain: Secondary | ICD-10-CM

## 2012-12-15 ENCOUNTER — Other Ambulatory Visit: Payer: Self-pay | Admitting: Cardiology

## 2012-12-15 MED ORDER — METOPROLOL TARTRATE 25 MG PO TABS: 25.0000 mg | ORAL_TABLET | Freq: Two times a day (BID) | ORAL | Status: DC

## 2013-01-06 ENCOUNTER — Encounter: Payer: Self-pay | Admitting: Cardiology

## 2013-01-06 ENCOUNTER — Ambulatory Visit (INDEPENDENT_AMBULATORY_CARE_PROVIDER_SITE_OTHER): Payer: Medicare Other | Admitting: Cardiology

## 2013-01-06 VITALS — BP 148/89 | HR 87 | Ht 69.0 in | Wt 250.0 lb

## 2013-01-06 DIAGNOSIS — I2510 Atherosclerotic heart disease of native coronary artery without angina pectoris: Secondary | ICD-10-CM

## 2013-01-06 DIAGNOSIS — I493 Ventricular premature depolarization: Secondary | ICD-10-CM

## 2013-01-06 DIAGNOSIS — E785 Hyperlipidemia, unspecified: Secondary | ICD-10-CM

## 2013-01-06 DIAGNOSIS — I4949 Other premature depolarization: Secondary | ICD-10-CM

## 2013-01-06 NOTE — Assessment & Plan Note (Signed)
Continue beta blocker, can always further advance Lopressor if needed.

## 2013-01-06 NOTE — Progress Notes (Signed)
Clinical Summary [name] [name] is a 57 y.o.male presenting for followup. He is a former patient of [name] [name], last seen in the office by [name] [name] in October 2013. He had undergone a cardiac catheterization around that time that demonstrated nonobstructive CAD with moderate LAD in-stent restenosis and normal LV function. Medical therapy was recommended. Beta blocker was increased for further suppression of PVCs and NSVT.  He presents today without any complaints of angina, only a rare sense of palpitations, no dizziness or syncope. He reports compliance with his medications. States he is due for followup lipids with [name] [name]. He is not exercising regularly, states that he is interested in going back to the [facility] where he used to swim.   No Known Allergies  Current Outpatient Prescriptions  Medication Sig Dispense Refill  . aspirin EC 81 MG EC tablet Take 1 tablet (81 mg total) by mouth daily.      [name] atorvastatin (LIPITOR) 80 MG tablet Take 80 mg by mouth daily.       . citalopram (CELEXA) 20 MG tablet Take 20 mg by mouth daily.      . fenofibrate (TRICOR) 145 MG tablet Take 145 mg by mouth daily.      . Fluticasone-Salmeterol (ADVAIR) 250-50 MCG/DOSE AEPB Inhale 1 puff into the lungs every 12 (twelve) hours.      . furosemide (LASIX) 20 MG tablet Take 20 mg by mouth daily.       [name] gabapentin (NEURONTIN) 400 MG capsule Take 400 mg by mouth 3 (three) times daily.      . insulin aspart (NOVOLOG) 100 UNIT/ML injection Inject into the skin 3 (three) times daily before meals.      . insulin glargine (LANTUS) 100 UNIT/ML injection Inject into the skin 2 (two) times daily. 60 u am, 60 units pm       . metFORMIN (GLUCOPHAGE) 1000 MG tablet Take 1 tablet (1,000 mg total) by mouth daily with breakfast. And 565m in the evening. HOLD 48 hours, restart on 07/23/2012.  50 tablet  11  . metoprolol tartrate (LOPRESSOR) 25 MG tablet Take 1 tablet (25 mg total) by mouth 2 (two) times daily.  60 tablet  2  .  nitroGLYCERIN (NITROSTAT) 0.4 MG SL tablet Place 0.4 mg under the tongue every 5 (five) minutes as needed.      .Marland Kitchenomeprazole (PRILOSEC) 20 MG capsule Take 20 mg by mouth 2 (two) times daily.      . ramipril (ALTACE) 5 MG tablet Take 5 mg by mouth daily.      . traZODone (DESYREL) 100 MG tablet Take 1 tablet (100 mg total) by mouth at bedtime.  30 tablet  0   No current facility-administered medications for this visit.    Past Medical History  Diagnosis Date  . Coronary atherosclerosis of native coronary artery     a. h/o LAD & OM stenting;  b. 2007 Cath: nonobs dzs, patent stents;  c. 07/2012 neg MV, EF 61%.  . Type 2 diabetes mellitus   . Asthma   . Essential hypertension, benign   . TIA (transient ischemic attack)     Social History Mr. BMcneesereports that he has never smoked. He does not have any smokeless tobacco history on file. Mr. BNicosonreports that he does not drink alcohol.  Review of Systems Arthritic pains, no orthopnea or PND. Stable appetite. Otherwise negative.  Physical Examination Filed Vitals:   01/06/13 1015  BP: 148/89  Pulse: 87  Filed Weights   01/06/13 1015  Weight: 250 lb (113.399 kg)   No acute distress. HEENT: Conjunctiva and lids normal, oropharynx clear. Neck: Supple, no elevated JVP or carotid bruits, no thyromegaly. Lungs: Clear to auscultation, nonlabored breathing at rest. Cardiac: Regular rate and rhythm, no S3 or significant systolic murmur, no pericardial rub. Abdomen: Soft, nontender, protuberant, bowel sounds present. Extremities: No pitting edema, distal pulses 2+.   Problem List and Plan   CAD, NATIVE VESSEL Clinically stable on medical therapy with cardiac catheterization from last October demonstrating nonobstructive disease with moderate LAD ISR, managed medically. No changes made to current regimen. I did discuss diet and regular exercise regimen. Followup arranged.  HYPERLIPIDEMIA-MIXED Due for followup lipids with Dr.  Scotty [name].  PVC's (premature ventricular contractions) Continue beta blocker, can always further advance Lopressor if needed.    [name] [name], M.D., F.A.C.C.

## 2013-01-06 NOTE — Assessment & Plan Note (Signed)
Clinically stable on medical therapy with cardiac catheterization from last October demonstrating nonobstructive disease with moderate LAD ISR, managed medically. No changes made to current regimen. I did discuss diet and regular exercise regimen. Followup arranged.

## 2013-01-06 NOTE — Patient Instructions (Addendum)

## 2013-01-06 NOTE — Assessment & Plan Note (Signed)
Due for followup lipids with Dr. Scotty Court.

## 2013-04-21 ENCOUNTER — Encounter: Payer: Self-pay | Admitting: *Deleted

## 2013-04-21 ENCOUNTER — Telehealth: Payer: Self-pay | Admitting: Physician Assistant

## 2013-04-21 NOTE — Telephone Encounter (Signed)
[name] [name] called the office.He needs a note stating that he is a patient here. He needs this for DSS (Medicaid) . States that he is trying to get help with his light bill and if he can prove he is a patient With heart conditions they may be able to help pay his bill. [name] [name] states he needs this today.  [employer] is getting ready to turn his lights out.

## 2013-04-21 NOTE — Telephone Encounter (Signed)
F/u   Pt called back after speaking to [facility] and being told that a msg has been sent regarding the letter he needs because [employer] is going to disconnect his lights today.  Patient became very upset after being told I could forward the msg to a nurse here because he wanted to speak to someone right now.  Patient began using profanity after I continued to explain to him that msgs are forwarded to the nurse and that we didn't know an exact time frame as to when the nurse could return his call but they would call him today.  He then asked for a supervisor and I tried to call andrea but Jari Sportsman stated she was out of the office.  After explaining that to the patient he continued to use profanity, also a lady in the background was doing the same.  I asked the patient if there was anything else he needed me to assist him with because I was going to forward a msg to a nurse as to what he needed but he continued to use profanity and become more upset.  At that point, I informed the patient that if there wasn't anything else that I could do and since he was using profanity I was going to disconnect the call.

## 2013-04-21 NOTE — Telephone Encounter (Signed)
Patient informed that letter can be provided stating he is a cardiac patient here at our office.

## 2013-06-16 ENCOUNTER — Other Ambulatory Visit: Payer: Self-pay | Admitting: Physician Assistant

## 2013-07-17 ENCOUNTER — Emergency Department (HOSPITAL_COMMUNITY): Payer: Medicare Other

## 2013-07-17 ENCOUNTER — Encounter (HOSPITAL_COMMUNITY): Payer: Self-pay | Admitting: *Deleted

## 2013-07-17 ENCOUNTER — Emergency Department (HOSPITAL_COMMUNITY)
Admission: EM | Admit: 2013-07-17 | Discharge: 2013-07-17 | Disposition: A | Discharge status: Home / Self Care | Payer: Medicare Other | Attending: Emergency Medicine | Admitting: Emergency Medicine

## 2013-07-17 DIAGNOSIS — R110 Nausea: Secondary | ICD-10-CM | POA: Insufficient documentation

## 2013-07-17 DIAGNOSIS — R61 Generalized hyperhidrosis: Secondary | ICD-10-CM | POA: Insufficient documentation

## 2013-07-17 DIAGNOSIS — Z79899 Other long term (current) drug therapy: Secondary | ICD-10-CM | POA: Insufficient documentation

## 2013-07-17 DIAGNOSIS — Z794 Long term (current) use of insulin: Secondary | ICD-10-CM | POA: Insufficient documentation

## 2013-07-17 DIAGNOSIS — E7800 Pure hypercholesterolemia, unspecified: Secondary | ICD-10-CM | POA: Insufficient documentation

## 2013-07-17 DIAGNOSIS — I10 Essential (primary) hypertension: Secondary | ICD-10-CM | POA: Insufficient documentation

## 2013-07-17 DIAGNOSIS — R079 Chest pain, unspecified: Secondary | ICD-10-CM | POA: Insufficient documentation

## 2013-07-17 DIAGNOSIS — E119 Type 2 diabetes mellitus without complications: Secondary | ICD-10-CM | POA: Insufficient documentation

## 2013-07-17 DIAGNOSIS — R6883 Chills (without fever): Secondary | ICD-10-CM | POA: Insufficient documentation

## 2013-07-17 DIAGNOSIS — J45901 Unspecified asthma with (acute) exacerbation: Secondary | ICD-10-CM | POA: Insufficient documentation

## 2013-07-17 DIAGNOSIS — IMO0002 Reserved for concepts with insufficient information to code with codable children: Secondary | ICD-10-CM | POA: Insufficient documentation

## 2013-07-17 DIAGNOSIS — Z8673 Personal history of transient ischemic attack (TIA), and cerebral infarction without residual deficits: Secondary | ICD-10-CM | POA: Insufficient documentation

## 2013-07-17 DIAGNOSIS — Z9861 Coronary angioplasty status: Secondary | ICD-10-CM | POA: Insufficient documentation

## 2013-07-17 DIAGNOSIS — R0600 Dyspnea, unspecified: Secondary | ICD-10-CM

## 2013-07-17 DIAGNOSIS — F411 Generalized anxiety disorder: Secondary | ICD-10-CM | POA: Insufficient documentation

## 2013-07-17 DIAGNOSIS — Z7982 Long term (current) use of aspirin: Secondary | ICD-10-CM | POA: Insufficient documentation

## 2013-07-17 DIAGNOSIS — I2510 Atherosclerotic heart disease of native coronary artery without angina pectoris: Secondary | ICD-10-CM | POA: Insufficient documentation

## 2013-07-17 HISTORY — DX: Anxiety disorder, unspecified: F41.9

## 2013-07-17 HISTORY — DX: Pure hypercholesterolemia, unspecified: E78.00

## 2013-07-17 LAB — CBC WITH DIFFERENTIAL/PLATELET
Eosinophils Absolute: 0.1 10*3/uL (ref 0.0–0.7)
Eosinophils Relative: 3 % (ref 0–5)
HCT: 39.3 % (ref 39.0–52.0)
Lymphocytes Relative: 28 % (ref 12–46)
Lymphs Abs: 1.1 10*3/uL (ref 0.7–4.0)
MCH: 33.1 pg (ref 26.0–34.0)
MCV: 88.5 fL (ref 78.0–100.0)
Monocytes Absolute: 0.3 10*3/uL (ref 0.1–1.0)
Platelets: 118 10*3/uL — ABNORMAL LOW (ref 150–400)
RBC: 4.44 MIL/uL (ref 4.22–5.81)
WBC: 3.9 10*3/uL — ABNORMAL LOW (ref 4.0–10.5)

## 2013-07-17 LAB — BASIC METABOLIC PANEL
BUN: 16 mg/dL (ref 6–23)
CO2: 24 mEq/L (ref 19–32)
Calcium: 9 mg/dL (ref 8.4–10.5)
Creatinine, Ser: 0.94 mg/dL (ref 0.50–1.35)
GFR calc non Af Amer: >90 mL/min (ref >90)
Glucose, Bld: 426 mg/dL — ABNORMAL HIGH (ref 70–99)

## 2013-07-17 LAB — D-DIMER, QUANTITATIVE: D-Dimer, Quant: 0.54 ug/mL-FEU — ABNORMAL HIGH (ref 0.00–0.48)

## 2013-07-17 MED ORDER — INSULIN ASPART 100 UNIT/ML ~~LOC~~ SOLN
15.0000 [IU] | Freq: Once | SUBCUTANEOUS | Status: AC
  Administered 2013-07-17: 15 [IU] via SUBCUTANEOUS
  Filled 2013-07-17: qty 1

## 2013-07-17 MED ORDER — IOHEXOL 350 MG/ML SOLN
100.0000 mL | Freq: Once | INTRAVENOUS | Status: AC | PRN
  Administered 2013-07-17: 100 mL via INTRAVENOUS

## 2013-07-17 NOTE — ED Provider Notes (Signed)
CSN: [id number]     Arrival date & time 07/17/13  3009 History   This chart was scribed for Maudry Diego, MD by Anastasia Pall, ED scribe and Jenne Campus, ED Scribe. This patient was seen in room APA05/APA05 and the patient's care was started at 10:08 AM.    Chief Complaint  Patient presents with  . Shortness of Breath    Patient is a 57 y.o. male presenting with shortness of breath. The history is provided by the patient. No language interpreter was used.  Shortness of Breath Severity:  Moderate Onset quality:  Sudden Duration: This morning. Timing:  Constant Progression:  Improving Chronicity:  New Context: smoke exposure   Associated symptoms: chest pain (He experienced tightness in his chest.) and diaphoresis   Associated symptoms: no abdominal pain, no cough, no fever, no headaches and no rash   Associated symptoms comment:  Chills, nausea.  Risk factors comment:  H/o asthma.   HPI Comments: [name] [name] is a 57 y.o. male with a h/o of asthma who presents to the Emergency Department complaining of sudden, constant, moderate SOB, onset today while riding in a vehicle. He states that he thought he smelled smoke, which triggers his asthma. He reports he couldn't breathe and that he felt like he was choking. He states an associated symptom of tightness in his chest, diaphoresis, chills and nausea. He denies any previous injury. He states that currently his symptoms have improved some, but not completely.  He denies congestion, fever, cough. He has h/o coronary atherosclerosis of native coronary artery, DM, benign hypertension, TIA, anxiety, and hypercholesteremia. He has a h/o coronary angioplasty with stent placement. He denies h/o smoking.   EMS reports NAD on arrival, CBG en route 457 and initiation of NS bolus.    Past Medical History  Diagnosis Date  . Coronary atherosclerosis of native coronary artery     a. h/o LAD & OM stenting;  b. 2007 Cath: nonobs dzs, patent stents;   c. 07/2012 neg MV, EF 61%.  . Type 2 diabetes mellitus   . Asthma   . Essential hypertension, benign   . TIA (transient ischemic attack)   . Anxiety   . Hypercholesteremia    Past Surgical History  Procedure Laterality Date  . Neck surgery    . Carpal tunnel release    . Shoulder arthroscopy    . Knee surgery  4/13  . Elbow surgery    . Coronary angioplasty with stent placement     No family history on file. History  Substance Use Topics  . Smoking status: Never Smoker   . Smokeless tobacco: Not on file  . Alcohol Use: No    Review of Systems  Constitutional: Positive for chills and diaphoresis. Negative for fever, appetite change and fatigue.  HENT: Negative for congestion, sinus pressure and ear discharge.   Eyes: Negative for discharge.  Respiratory: Positive for shortness of breath. Negative for cough.   Cardiovascular: Positive for chest pain (He experienced tightness in his chest.).  Gastrointestinal: Positive for nausea. Negative for abdominal pain and diarrhea.  Genitourinary: Negative for frequency and hematuria.  Musculoskeletal: Negative for back pain.  Skin: Negative for rash.  Neurological: Negative for seizures and headaches.  Psychiatric/Behavioral: Negative for hallucinations.  All other systems reviewed and are negative.    Allergies  Review of patient's allergies indicates no known allergies.  Home Medications   Current Outpatient Rx  Name  Route  Sig  Dispense  Refill  .  aspirin EC 81 MG EC tablet   Oral   Take 1 tablet (81 mg total) by mouth daily.         [name] atorvastatin (LIPITOR) 80 MG tablet   Oral   Take 80 mg by mouth daily.          . citalopram (CELEXA) 20 MG tablet   Oral   Take 20 mg by mouth daily.         . fenofibrate (TRICOR) 145 MG tablet   Oral   Take 145 mg by mouth daily.         . fluticasone (FLONASE) 50 MCG/ACT nasal spray   Nasal   Place 2 sprays into the nose daily.         . Fluticasone-Salmeterol  (ADVAIR) 250-50 MCG/DOSE AEPB   Inhalation   Inhale 1 puff into the lungs every 12 (twelve) hours.         . gabapentin (NEURONTIN) 400 MG capsule   Oral   Take 400 mg by mouth 3 (three) times daily.         . insulin aspart (NOVOLOG) 100 UNIT/ML injection   Subcutaneous   Inject 2-30 Units into the skin 3 (three) times daily before meals.          . insulin glargine (LANTUS) 100 UNIT/ML injection   Subcutaneous   Inject 60 Units into the skin 2 (two) times daily.          . Melatonin 5 MG TABS   Oral   Take 5 mg by mouth at bedtime as needed (Sleep).         . metFORMIN (GLUCOPHAGE) 1000 MG tablet   Oral   Take 1,000 mg by mouth 2 (two) times daily.         . metoprolol tartrate (LOPRESSOR) 25 MG tablet   Oral   Take 25 mg by mouth 2 (two) times daily.         [name] omeprazole (PRILOSEC) 20 MG capsule   Oral   Take 20 mg by mouth 2 (two) times daily.         . ramipril (ALTACE) 5 MG tablet   Oral   Take 5 mg by mouth daily.         . nitroGLYCERIN (NITROSTAT) 0.4 MG SL tablet   Sublingual   Place 0.4 mg under the tongue every 5 (five) minutes as needed.          Triage Vitals: BP 162/78  Pulse 89  Temp(Src) 99.2 F (37.3 C) (Oral)  Resp 18  Ht 5' 9"  (1.753 m)  Wt 240 lb (108.863 kg)  BMI 35.43 kg/m2  SpO2 97%  Physical Exam  Nursing note and vitals reviewed. Constitutional: He is oriented to person, place, and time. He appears well-developed and well-nourished.  HENT:  Head: Normocephalic and atraumatic.  Eyes: Conjunctivae and EOM are normal. No scleral icterus.  Neck: Neck supple. No thyromegaly present.  Cardiovascular: Normal rate.  Exam reveals no gallop and no friction rub.   No murmur heard. Pulmonary/Chest: Effort normal and breath sounds normal. No stridor. He has no wheezes. He has no rales. He exhibits no tenderness.  Abdominal: He exhibits no distension. There is no tenderness. There is no rebound.  Musculoskeletal: Normal  range of motion. He exhibits no edema.  Lymphadenopathy:    He has no cervical adenopathy.  Neurological: He is alert and oriented to person, place, and time. Coordination normal.  Skin: Skin is dry.  No rash noted. No erythema.  Psychiatric: He has a normal mood and affect. His behavior is normal.    ED Course  Procedures (including critical care time)  DIAGNOSTIC STUDIES: Oxygen Saturation is 97% on room air, normal by my interpretation.    COORDINATION OF CARE: 10:11 AM-Discussed treatment plan which includes CXR, CBC panel, BMP, Troponin, Saline lock IV, EKG, cardiac monitoring with pt at bedside and pt agreed to plan.      Labs Review Labs Reviewed  CBC WITH DIFFERENTIAL  BASIC METABOLIC PANEL  TROPONIN I  D-DIMER, QUANTITATIVE   Imaging Review Dg Chest Portable 1 View  07/17/2013   *RADIOLOGY REPORT*  Clinical Data: Shortness of breath  PORTABLE CHEST - 1 VIEW  Comparison: Chest radiograph 12/02/2012  Findings: Stable cardiac and mediastinal contours.  No consolidative pulmonary opacities.  No pleural effusion or pneumothorax.  Regional skeleton is unremarkable.  IMPRESSION: No acute cardiopulmonary process.   Original Report Authenticated By: Lovey Newcomer, M.D    Date: 07/17/2013  Rate:91  Rhythm: normal sinus rhythm  QRS Axis: normal  Intervals: normal  ST/T Wave abnormalities: nonspecific ST changes  Conduction Disutrbances:none  Narrative Interpretation:   Old EKG Reviewed: none available   MDM  No diagnosis found.     The chart was scribed for me under my direct supervision.  I personally performed the history, physical, and medical decision making and all procedures in the evaluation of this patient.Maudry Diego, MD 07/17/13 [phone]

## 2013-07-17 NOTE — ED Notes (Signed)
Began having sob while riding in a vehicle. Stated he thought he smelled smoke, which triggers his asthma. NAD on arrival. CBG en route 457. EMS initiated a NS bolus. NAD at this time.

## 2013-08-18 ENCOUNTER — Encounter (INDEPENDENT_AMBULATORY_CARE_PROVIDER_SITE_OTHER): Payer: Self-pay

## 2013-08-18 ENCOUNTER — Ambulatory Visit (INDEPENDENT_AMBULATORY_CARE_PROVIDER_SITE_OTHER): Payer: Medicare Other | Admitting: Neurology

## 2013-08-18 ENCOUNTER — Encounter: Payer: Self-pay | Admitting: Neurology

## 2013-08-18 VITALS — BP 155/89 | HR 88 | Ht 70.0 in | Wt 234.0 lb

## 2013-08-18 DIAGNOSIS — G459 Transient cerebral ischemic attack, unspecified: Secondary | ICD-10-CM

## 2013-08-18 DIAGNOSIS — M545 Low back pain, unspecified: Secondary | ICD-10-CM

## 2013-08-18 MED ORDER — GABAPENTIN 600 MG PO TABS: 600.0000 mg | ORAL_TABLET | Freq: Three times a day (TID) | ORAL | Status: DC

## 2013-08-18 NOTE — Patient Instructions (Signed)
Overall you are doing fairly well but I do want to suggest a few things today:   As far as your medications are concerned, I would like to suggest increasing the gabapentin to 66m three times a day.   As far as diagnostic testing: I would like to get a MRI of the brain  I would like to see you back in 4 to 6 months, sooner if we need to. Please call [email or web address] any interim questions, concerns, problems, updates or refill requests.   Please also call [place] any test results so we can go over those with you on the phone.  My clinical assistant and will answer any of your questions and relay your messages to me and also relay most of my messages to you.   Our phone number is [phone] We also have an after hours call service for urgent matters and there is a physician on-call for urgent questions. For any emergencies you know to call 911 or go to the nearest emergency room

## 2013-08-18 NOTE — Progress Notes (Signed)
[facility]    Provider:  Dr Janann Colonel Referring Provider: Deloria Lair., MD Primary Care Physician:  Deloria Lair, MD  CC:  Headache and numbness, memory  HPI:  [name] [name] is a 57 y.o. male here as a referral from Dr. Scotty Court for headache and left-sided weakness.  Started around 1 month ago, comes and goes. Feels pressure on top of his head and put on the left side. Described as squeezing dull aching pain. At worst can be a 9/10. Gets associated nausea and vomiting, positive photo and phonophobia. Some blurry vision and some lightheadedness with headaches. With headaches she also notes that the left side of his body will become weak and he gets some sensory changes. These events are occurring around one time per day. It can last hours. Has tried nonsteroidal anti-inflammatories with no benefit. Has tried Excedrin Migraine for benefit. Also notes periods of radicular type pain radiating down his left lower extremity, has chronic lumbar back pain. Notes having had a herniated disc unsure what level.  Does have history of TIA in the past. Per the description appears similar to these events, patient had a headache and then developed weakness on his left side. Unsure if he has had any imaging at that time. No medication changes in the past month, no increased stress. Has OSA, has stopped using CPAP for the past 2 weeks. Has had severe allergies for the past few weeks, thinks headaches started before the allergies.   Review of Systems: Out of a complete 14 system review, the patient complains of only the following symptoms, and all other reviewed systems are negative. Other for blurred vision chest pain cough snoring feeling hot feeling cold neuro confusion headache numbness weakness slurred speech difficulty swallowing dizziness sleepiness snoring joint pain joint swelling anxiety racing thoughts allergies hearing loss ringing in ears spinning sensation  History   Social  History  . Marital Status: Married    Spouse Name: [name]    Number of Children: 3  . Years of Education: 12 th    Occupational History  . Not on file.   Social History Main Topics  . Smoking status: Never Smoker   . Smokeless tobacco: Never Used  . Alcohol Use: No  . Drug Use: No  . Sexual Activity: Not Currently   Other Topics Concern  . Not on file   Social History Narrative   Patient lives at home with wife [name].   Patient does not work.    Patient has a 12 th grade education.    Patient has 3 children.           No family history on file.  Past Medical History  Diagnosis Date  . Coronary atherosclerosis of native coronary artery     a. h/o LAD & OM stenting;  b. 2007 Cath: nonobs dzs, patent stents;  c. 07/2012 neg MV, EF 61%.  . Type 2 diabetes mellitus   . Asthma   . Essential hypertension, benign   . TIA (transient ischemic attack)   . Anxiety   . Hypercholesteremia     Past Surgical History  Procedure Laterality Date  . Neck surgery    . Carpal tunnel release    . Shoulder arthroscopy    . Knee surgery  4/13  . Elbow surgery    . Coronary angioplasty with stent placement      Current Outpatient Prescriptions  Medication Sig Dispense Refill  . aspirin 81 MG tablet Take 81 mg by  mouth daily.      [name] atorvastatin (LIPITOR) 80 MG tablet Take 80 mg by mouth daily.       . citalopram (CELEXA) 20 MG tablet Take 20 mg by mouth daily.      . fenofibrate (TRICOR) 145 MG tablet Take 145 mg by mouth daily.      . fluticasone (FLONASE) 50 MCG/ACT nasal spray Place 2 sprays into the nose daily.      . Fluticasone-Salmeterol (ADVAIR) 250-50 MCG/DOSE AEPB Inhale 1 puff into the lungs every 12 (twelve) hours.      . gabapentin (NEURONTIN) 400 MG capsule Take 400 mg by mouth 3 (three) times daily.      . insulin aspart (NOVOLOG) 100 UNIT/ML injection Inject 2-30 Units into the skin 3 (three) times daily before meals.       . insulin glargine (LANTUS) 100  UNIT/ML injection Inject 60 Units into the skin 2 (two) times daily.       . Melatonin 5 MG TABS Take 5 mg by mouth at bedtime as needed (Sleep).      . metFORMIN (GLUCOPHAGE) 1000 MG tablet Take 1,000 mg by mouth 2 (two) times daily.      . metoprolol tartrate (LOPRESSOR) 25 MG tablet Take 25 mg by mouth 2 (two) times daily.      . nitroGLYCERIN (NITROSTAT) 0.4 MG SL tablet Place 0.4 mg under the tongue every 5 (five) minutes as needed.      [name] omeprazole (PRILOSEC) 20 MG capsule Take 20 mg by mouth 2 (two) times daily.      . ramipril (ALTACE) 5 MG tablet Take 5 mg by mouth daily.       No current facility-administered medications for this visit.    Allergies as of 08/18/2013  . (No Known Allergies)    Vitals: BP 155/89  Pulse 88  Ht 5' 10"  (1.778 m)  Wt 234 lb (106.142 kg)  BMI 33.58 kg/m2 Last Weight:  Wt Readings from Last 1 Encounters:  08/18/13 234 lb (106.142 kg)   Last Height:   Ht Readings from Last 1 Encounters:  08/18/13 5' 10"  (1.778 m)     Physical exam: Exam: Gen: NAD, conversant Eyes: anicteric sclerae, moist conjunctivae HENT: Atraumatic, oropharynx clear Neck: Trachea midline; supple,  Lungs: CTA, no wheezing, rales, rhonic                          CV: RRR, no MRG Abdomen: Soft, non-tender;  Extremities: No peripheral edema  Skin: Normal temperature, no rash,  Psych: Appropriate affect, pleasant  Neuro: MS: AA&Ox3, appropriately interactive, normal affect   MOCA 30/30  CN: PERRL, EOMI no nystagmus, no ptosis, VFF to FC bilat, sensation intact to LT V1-V3 bilat, face symmetric, no weakness, hearing grossly intact, palate elevates symmetrically, shoulder shrug 5/5 bilat,  tongue protrudes midline, no fasiculations noted.  Motor: normal bulk and tone Strength: 5/5  In all extremities  Coord: rapid alternating and point-to-point (FNF, HTS) movements intact.  Reflexes: symmetrical, bilat downgoing toes  Sens: LT intact in all  extremities  Gait: posture, stance, stride and arm-swing normal. Tandem gait intact. Able to walk on heels and toes. Romberg absent.   Assessment:  After physical and neurologic examination, review of laboratory studies, imaging, neurophysiology testing and pre-existing records, assessment will be reviewed on the problem list.  Plan:  Treatment plan and additional workup will be reviewed under Problem List.  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 Differential would also include recurrent TIAs. Has history of chronic lumbar back pain with radicular type symptoms radiating down LLE. Will check MRI brain. Increase Neurontin to 673m TID for headache and back pain relief. Referral placed for PT for lumbar back pain treatment. Follow up in 6 months

## 2013-10-12 ENCOUNTER — Encounter: Payer: Self-pay | Admitting: Cardiology

## 2013-12-03 ENCOUNTER — Emergency Department (HOSPITAL_COMMUNITY): Payer: Medicare Other

## 2013-12-03 ENCOUNTER — Encounter (HOSPITAL_COMMUNITY): Payer: Self-pay | Admitting: Emergency Medicine

## 2013-12-03 ENCOUNTER — Inpatient Hospital Stay (HOSPITAL_COMMUNITY)
Admission: EM | Admit: 2013-12-03 | Discharge: 2013-12-05 | DRG: 303 | Disposition: A | Discharge status: Home / Self Care | Payer: Medicare Other | Attending: Family Medicine | Admitting: Family Medicine

## 2013-12-03 DIAGNOSIS — D72819 Decreased white blood cell count, unspecified: Secondary | ICD-10-CM | POA: Diagnosis present

## 2013-12-03 DIAGNOSIS — G4733 Obstructive sleep apnea (adult) (pediatric): Secondary | ICD-10-CM

## 2013-12-03 DIAGNOSIS — R079 Chest pain, unspecified: Secondary | ICD-10-CM

## 2013-12-03 DIAGNOSIS — N179 Acute kidney failure, unspecified: Secondary | ICD-10-CM | POA: Diagnosis present

## 2013-12-03 DIAGNOSIS — Z794 Long term (current) use of insulin: Secondary | ICD-10-CM

## 2013-12-03 DIAGNOSIS — I493 Ventricular premature depolarization: Secondary | ICD-10-CM

## 2013-12-03 DIAGNOSIS — Z9861 Coronary angioplasty status: Secondary | ICD-10-CM

## 2013-12-03 DIAGNOSIS — R739 Hyperglycemia, unspecified: Secondary | ICD-10-CM

## 2013-12-03 DIAGNOSIS — Z23 Encounter for immunization: Secondary | ICD-10-CM

## 2013-12-03 DIAGNOSIS — I2510 Atherosclerotic heart disease of native coronary artery without angina pectoris: Principal | ICD-10-CM

## 2013-12-03 DIAGNOSIS — E871 Hypo-osmolality and hyponatremia: Secondary | ICD-10-CM | POA: Diagnosis present

## 2013-12-03 DIAGNOSIS — J45909 Unspecified asthma, uncomplicated: Secondary | ICD-10-CM | POA: Diagnosis present

## 2013-12-03 DIAGNOSIS — Z8673 Personal history of transient ischemic attack (TIA), and cerebral infarction without residual deficits: Secondary | ICD-10-CM

## 2013-12-03 DIAGNOSIS — D696 Thrombocytopenia, unspecified: Secondary | ICD-10-CM | POA: Diagnosis present

## 2013-12-03 DIAGNOSIS — Z7982 Long term (current) use of aspirin: Secondary | ICD-10-CM

## 2013-12-03 DIAGNOSIS — E119 Type 2 diabetes mellitus without complications: Secondary | ICD-10-CM | POA: Diagnosis present

## 2013-12-03 DIAGNOSIS — F411 Generalized anxiety disorder: Secondary | ICD-10-CM | POA: Diagnosis present

## 2013-12-03 DIAGNOSIS — I200 Unstable angina: Secondary | ICD-10-CM | POA: Diagnosis present

## 2013-12-03 DIAGNOSIS — R509 Fever, unspecified: Secondary | ICD-10-CM | POA: Diagnosis present

## 2013-12-03 DIAGNOSIS — I209 Angina pectoris, unspecified: Secondary | ICD-10-CM

## 2013-12-03 DIAGNOSIS — K219 Gastro-esophageal reflux disease without esophagitis: Secondary | ICD-10-CM | POA: Diagnosis present

## 2013-12-03 DIAGNOSIS — E7800 Pure hypercholesterolemia, unspecified: Secondary | ICD-10-CM | POA: Diagnosis present

## 2013-12-03 DIAGNOSIS — N289 Disorder of kidney and ureter, unspecified: Secondary | ICD-10-CM

## 2013-12-03 DIAGNOSIS — I10 Essential (primary) hypertension: Secondary | ICD-10-CM | POA: Diagnosis present

## 2013-12-03 DIAGNOSIS — E782 Mixed hyperlipidemia: Secondary | ICD-10-CM | POA: Diagnosis present

## 2013-12-03 DIAGNOSIS — F419 Anxiety disorder, unspecified: Secondary | ICD-10-CM | POA: Diagnosis present

## 2013-12-03 DIAGNOSIS — J09X2 Influenza due to identified novel influenza A virus with other respiratory manifestations: Secondary | ICD-10-CM | POA: Diagnosis present

## 2013-12-03 DIAGNOSIS — E1165 Type 2 diabetes mellitus with hyperglycemia: Secondary | ICD-10-CM | POA: Diagnosis present

## 2013-12-03 DIAGNOSIS — E1169 Type 2 diabetes mellitus with other specified complication: Secondary | ICD-10-CM | POA: Diagnosis present

## 2013-12-03 DIAGNOSIS — E785 Hyperlipidemia, unspecified: Secondary | ICD-10-CM

## 2013-12-03 LAB — CBC
HCT: 39.6 % (ref 39.0–52.0)
Hemoglobin: 14.2 g/dL (ref 13.0–17.0)
MCH: 31.8 pg (ref 26.0–34.0)
MCHC: 35.9 g/dL (ref 30.0–36.0)
MCV: 88.8 fL (ref 78.0–100.0)
PLATELETS: 95 10*3/uL — AB (ref 150–400)
RBC: 4.46 MIL/uL (ref 4.22–5.81)
RDW: 13 % (ref 11.5–15.5)
WBC: 2.4 10*3/uL — AB (ref 4.0–10.5)

## 2013-12-03 LAB — BASIC METABOLIC PANEL
BUN: 20 mg/dL (ref 6–23)
CALCIUM: 9.3 mg/dL (ref 8.4–10.5)
CHLORIDE: 94 meq/L — AB (ref 96–112)
CO2: 25 meq/L (ref 19–32)
Creatinine, Ser: 1.38 mg/dL — ABNORMAL HIGH (ref 0.50–1.35)
GFR calc non Af Amer: 56 mL/min — ABNORMAL LOW (ref >90)
GFR, EST AFRICAN AMERICAN: 65 mL/min — AB (ref >90)
Glucose, Bld: 310 mg/dL — ABNORMAL HIGH (ref 70–99)
Potassium: 3.8 mEq/L (ref 3.7–5.3)
SODIUM: 133 meq/L — AB (ref 137–147)

## 2013-12-03 LAB — POCT I-STAT TROPONIN I: Troponin i, poc: 0 ng/mL (ref 0.00–0.08)

## 2013-12-03 MED ORDER — ACETAMINOPHEN 500 MG PO TABS
1000.0000 mg | ORAL_TABLET | Freq: Once | ORAL | Status: AC
  Administered 2013-12-03: 1000 mg via ORAL
  Filled 2013-12-03: qty 2

## 2013-12-03 MED ORDER — NITROGLYCERIN 2 % TD OINT
0.5000 [in_us] | TOPICAL_OINTMENT | Freq: Once | TRANSDERMAL | Status: AC
  Administered 2013-12-04: 0.5 [in_us] via TOPICAL
  Filled 2013-12-03: qty 1

## 2013-12-03 MED ORDER — ASPIRIN 81 MG PO CHEW
324.0000 mg | CHEWABLE_TABLET | Freq: Once | ORAL | Status: AC
  Administered 2013-12-03: 324 mg via ORAL
  Filled 2013-12-03: qty 4

## 2013-12-03 MED ORDER — ENOXAPARIN SODIUM 100 MG/ML ~~LOC~~ SOLN
1.0000 mg/kg | Freq: Once | SUBCUTANEOUS | Status: AC
  Administered 2013-12-04: 100 mg via SUBCUTANEOUS
  Filled 2013-12-03: qty 1

## 2013-12-03 NOTE — H&P (Signed)
PCP:   TAPPER,DAVID B, MD   Chief Complaint:  cp  HPI: 58 yo male h/o cad stent x 2, dm, htn, hld comes in with sscp started yesterday lasting over 45mn.  Has not had to use ntg in over a year.  He started yesterday with uri symptoms and cough, had a mild asthma exac yesterday.  The cp started then.  Mainly noticed the pain when he was lifting something over 20 lbs and walking.  No le edema or swelling.  Coughing a lot with nasal congestion.  His wife has also been fighting a resp tract infection also.  No fevers.  But temp over 102 on arrival to ED.  No cp now.  Review of Systems:  Positive and negative as per HPI otherwise all other systems are negative  Past Medical History: Past Medical History  Diagnosis Date  . Coronary atherosclerosis of native coronary artery     a. h/o LAD & OM stenting;  b. 2007 Cath: nonobs dzs, patent stents;  c. 07/2012 neg MV, EF 61%.  . Type 2 diabetes mellitus   . Asthma   . Essential hypertension, benign   . TIA (transient ischemic attack)   . Anxiety   . Hypercholesteremia   . Acid reflux    Past Surgical History  Procedure Laterality Date  . Neck surgery    . Carpal tunnel release    . Shoulder arthroscopy    . Knee surgery  4/13  . Elbow surgery    . Coronary angioplasty with stent placement      Medications: Prior to Admission medications   Medication Sig Start Date End Date Taking? Authorizing Provider  aspirin 81 MG tablet Take 81 mg by mouth daily.   Yes Historical Provider, MD  atorvastatin (LIPITOR) 80 MG tablet Take 80 mg by mouth daily.  06/09/12  Yes Historical Provider, MD  citalopram (CELEXA) 20 MG tablet Take 20 mg by mouth daily.   Yes Historical Provider, MD  fenofibrate (TRICOR) 145 MG tablet Take 145 mg by mouth daily.   Yes Historical Provider, MD  fluticasone (FLONASE) 50 MCG/ACT nasal spray Place 2 sprays into the nose daily.   Yes Historical Provider, MD  Fluticasone-Salmeterol (ADVAIR) 250-50 MCG/DOSE AEPB Inhale 1  puff into the lungs every 12 (twelve) hours.   Yes Historical Provider, MD  gabapentin (NEURONTIN) 600 MG tablet Take 1 tablet (600 mg total) by mouth 3 (three) times daily. 08/18/13  Yes PHulen Luster DO  insulin aspart (NOVOLOG) 100 UNIT/ML injection Inject 2-30 Units into the skin 3 (three) times daily before meals.    Yes Historical Provider, MD  insulin glargine (LANTUS) 100 UNIT/ML injection Inject 60 Units into the skin 2 (two) times daily.    Yes Historical Provider, MD  Melatonin 5 MG TABS Take 5 mg by mouth at bedtime as needed (Sleep).   Yes Historical Provider, MD  meloxicam (MOBIC) 15 MG tablet Take 15 mg by mouth daily. 11/21/13  Yes Historical Provider, MD  metFORMIN (GLUCOPHAGE) 1000 MG tablet Take 1,000 mg by mouth 2 (two) times daily. 07/20/12  Yes Rhonda G Barrett, PA-C  metoprolol tartrate (LOPRESSOR) 25 MG tablet Take 25 mg by mouth 2 (two) times daily.   Yes Historical Provider, MD  omeprazole (PRILOSEC) 20 MG capsule Take 20 mg by mouth 2 (two) times daily.   Yes Historical Provider, MD  ramipril (ALTACE) 5 MG tablet Take 5 mg by mouth daily.   Yes Historical Provider, MD  nitroGLYCERIN (  NITROSTAT) 0.4 MG SL tablet Place 0.4 mg under the tongue every 5 (five) minutes as needed.    Historical Provider, MD    Allergies:  No Known Allergies  Social History:  reports that he has never smoked. He has never used smokeless tobacco. He reports that he does not drink alcohol or use illicit drugs.  Family History: History reviewed. No pertinent family history.  Physical Exam: Filed Vitals:   12/03/13 2151 12/03/13 2200 12/03/13 2338 12/04/13 0058  BP:  157/82 140/76 143/78  Pulse:  105 101 103  Temp:   99.2 F (37.3 C) 98.8 F (37.1 C)  TempSrc:   Oral Oral  Resp:  15 16 16   Height:    5' 9"  (1.753 m)  Weight:    104.7 kg (230 lb 13.2 oz)  SpO2: 98% 100% 98% 94%   General appearance: alert, cooperative and no distress Head: Normocephalic, without obvious  abnormality, atraumatic Eyes: negative Nose: Nares normal. Septum midline. Mucosa normal. No drainage or sinus tenderness. Neck: no JVD and supple, symmetrical, trachea midline Lungs: clear to auscultation bilaterally Heart: regular rate and rhythm, S1, S2 normal, no murmur, click, rub or gallop Abdomen: soft, non-tender; bowel sounds normal; no masses,  no organomegaly Extremities: extremities normal, atraumatic, no cyanosis or edema Pulses: 2+ and symmetric Skin: Skin color, texture, turgor normal. No rashes or lesions Neurologic: Grossly normal    Labs on Admission:   Recent Labs  12/03/13 2154  NA 133*  K 3.8  CL 94*  CO2 25  GLUCOSE 310*  BUN 20  CREATININE 1.38*  CALCIUM 9.3    Recent Labs  12/03/13 2154  WBC 2.4*  HGB 14.2  HCT 39.6  MCV 88.8  PLT 95*   Radiological Exams on Admission: Dg Chest Port 1 View  12/03/2013   CLINICAL DATA:  Chest pain  EXAM: PORTABLE CHEST - 1 VIEW  COMPARISON:  07/17/2013  FINDINGS: The heart size and mediastinal contours are within normal limits. Both lungs are clear. The visualized skeletal structures are unremarkable.  IMPRESSION: No active disease.   Electronically Signed   By: Daryll Brod M.D.   On: 12/03/2013 22:18    Assessment/Plan  58 yo male with flu type symptoms and cp/usa  Principal Problem:   Unstable angina-  Atypical features.  No reproducible chest pain on exam.  romi and ck echo in am.  Pt high risk.  Cover with lovenox and ntg paste.  Active Problems:   HYPERLIPIDEMIA-MIXED   Essential hypertension, benign   CAD, NATIVE VESSEL   Type 2 diabetes mellitus   Anxiety   Chest pain   Fever, unspecified  Ck flu pcr.  Give tamiflu.    DAVID,RACHAL A 12/04/2013, 1:24 AM

## 2013-12-03 NOTE — ED Provider Notes (Signed)
CSN: [id number]     Arrival date & time 12/03/13  2129 History   None    This chart was scribed for Delora Fuel, MD by Forrestine Him, ED Scribe. This patient was seen in room APA19/APA19 and the patient's care was started 11:04 PM.   Chief Complaint  Patient presents with  . Chest Pain   The history is provided by the patient. No language interpreter was used.    HPI Comments: [name] [name] is a 58 y.o. male with a PMHx of DM, TIA, and coronary atherosclerosis who presents to the Emergency Department complaining of sudden onset, ongoing, intermittent chest pain described as "heaviness" that initially started yesterday. He states the pain radiates into his back when it comes, and reports each episode lasting from 30 minutes to a hour. Pt states the episode he experienced today lasted for about 1 hour. He says he noted the pain after picking up a 30 pound object. He reports 3 episodes brought on by lifting since time of onset. He rates the pain 8/10 when it comes, and says the pain is similar to the discomfort he experienced when after getting his stent placed. He denies taking any nitro when his CP came on, but states he took an aspirin prior to arrival as he takes 1 every day as prescribed. He denies having to use his nitro in the last year. Pt has a history of a heart catheterization, and a cardiac stent placement.  Past Medical History  Diagnosis Date  . Coronary atherosclerosis of native coronary artery     a. h/o LAD & OM stenting;  b. 2007 Cath: nonobs dzs, patent stents;  c. 07/2012 neg MV, EF 61%.  . Type 2 diabetes mellitus   . Asthma   . Essential hypertension, benign   . TIA (transient ischemic attack)   . Anxiety   . Hypercholesteremia   . Acid reflux    Past Surgical History  Procedure Laterality Date  . Neck surgery    . Carpal tunnel release    . Shoulder arthroscopy    . Knee surgery  4/13  . Elbow surgery    . Coronary angioplasty with stent placement     History  reviewed. No pertinent family history. History  Substance Use Topics  . Smoking status: Never Smoker   . Smokeless tobacco: Never Used  . Alcohol Use: No    Review of Systems  Constitutional: Negative for fever and chills.  Cardiovascular: Positive for chest pain.  All other systems reviewed and are negative.    Allergies  Review of patient's allergies indicates no known allergies.  Home Medications   Current Outpatient Rx  Name  Route  Sig  Dispense  Refill  . aspirin 81 MG tablet   Oral   Take 81 mg by mouth daily.         [name] atorvastatin (LIPITOR) 80 MG tablet   Oral   Take 80 mg by mouth daily.          . citalopram (CELEXA) 20 MG tablet   Oral   Take 20 mg by mouth daily.         . fenofibrate (TRICOR) 145 MG tablet   Oral   Take 145 mg by mouth daily.         . fluticasone (FLONASE) 50 MCG/ACT nasal spray   Nasal   Place 2 sprays into the nose daily.         . Fluticasone-Salmeterol (ADVAIR) 250-50 MCG/DOSE  AEPB   Inhalation   Inhale 1 puff into the lungs every 12 (twelve) hours.         . gabapentin (NEURONTIN) 600 MG tablet   Oral   Take 1 tablet (600 mg total) by mouth 3 (three) times daily.   90 tablet   3   . insulin aspart (NOVOLOG) 100 UNIT/ML injection   Subcutaneous   Inject 2-30 Units into the skin 3 (three) times daily before meals.          . insulin glargine (LANTUS) 100 UNIT/ML injection   Subcutaneous   Inject 60 Units into the skin 2 (two) times daily.          . Melatonin 5 MG TABS   Oral   Take 5 mg by mouth at bedtime as needed (Sleep).         . meloxicam (MOBIC) 15 MG tablet   Oral   Take 15 mg by mouth daily.         . metFORMIN (GLUCOPHAGE) 1000 MG tablet   Oral   Take 1,000 mg by mouth 2 (two) times daily.         . metoprolol tartrate (LOPRESSOR) 25 MG tablet   Oral   Take 25 mg by mouth 2 (two) times daily.         [name] omeprazole (PRILOSEC) 20 MG capsule   Oral   Take 20 mg by mouth 2  (two) times daily.         . ramipril (ALTACE) 5 MG tablet   Oral   Take 5 mg by mouth daily.         . nitroGLYCERIN (NITROSTAT) 0.4 MG SL tablet   Sublingual   Place 0.4 mg under the tongue every 5 (five) minutes as needed.          Triage Vitals: BP 157/82  Pulse 105  Temp(Src) 102.6 F (39.2 C) (Oral)  Resp 15  Ht 5' 9"  (1.753 m)  Wt 225 lb (102.059 kg)  BMI 33.21 kg/m2  SpO2 100%  Physical Exam  Nursing note and vitals reviewed. Constitutional: He is oriented to person, place, and time. He appears well-developed and well-nourished.  HENT:  Head: Normocephalic.  Eyes: EOM are normal.  Neck: Normal range of motion.  Cardiovascular: Normal rate and regular rhythm.   Pulmonary/Chest: Effort normal.  Abdominal: He exhibits no distension.  Musculoskeletal: Normal range of motion.  Neurological: He is alert and oriented to person, place, and time.  Psychiatric: He has a normal mood and affect.    ED Course  Procedures (including critical care time)  DIAGNOSTIC STUDIES: Oxygen Saturation is 100% on RA, Normal by my interpretation.    COORDINATION OF CARE: 11:03 PM-Discussed treatment plan with pt at bedside and pt agreed to plan.     Labs Review Results for orders placed during the hospital encounter of 12/03/13  CBC      Result Value Range   WBC 2.4 (*) 4.0 - 10.5 K/uL   RBC 4.46  4.22 - 5.81 MIL/uL   Hemoglobin 14.2  13.0 - 17.0 g/dL   HCT 39.6  39.0 - 52.0 %   MCV 88.8  78.0 - 100.0 fL   MCH 31.8  26.0 - 34.0 pg   MCHC 35.9  30.0 - 36.0 g/dL   RDW 13.0  11.5 - 15.5 %   Platelets 95 (*) 150 - 400 K/uL  BASIC METABOLIC PANEL      Result Value Range  Sodium 133 (*) 137 - 147 mEq/L   Potassium 3.8  3.7 - 5.3 mEq/L   Chloride 94 (*) 96 - 112 mEq/L   CO2 25  19 - 32 mEq/L   Glucose, Bld 310 (*) 70 - 99 mg/dL   BUN 20  6 - 23 mg/dL   Creatinine, Ser 1.38 (*) 0.50 - 1.35 mg/dL   Calcium 9.3  8.4 - 10.5 mg/dL   GFR calc non Af Amer 56 (*) >90 mL/min    GFR calc Af Amer 65 (*) >90 mL/min  POCT I-STAT TROPONIN I      Result Value Range   Troponin i, poc 0.00  0.00 - 0.08 ng/mL   Comment 3            Imaging Review Dg Chest Port 1 View  12/03/2013   CLINICAL DATA:  Chest pain  EXAM: PORTABLE CHEST - 1 VIEW  COMPARISON:  07/17/2013  FINDINGS: The heart size and mediastinal contours are within normal limits. Both lungs are clear. The visualized skeletal structures are unremarkable.  IMPRESSION: No active disease.   Electronically Signed   By: Daryll Brod M.D.   On: 12/03/2013 22:18    EKG Interpretation    Date/Time:  Sunday December 03 2013 21:36:13 EST Ventricular Rate:  111 PR Interval:  120 QRS Duration: 84 QT Interval:  322 QTC Calculation: 437 R Axis:   -22 Text Interpretation:  Sinus tachycardia Otherwise normal ECG When compared with ECG of 17-Jul-2013 09:42, No significant change was found Confirmed by [facility]  MD, Sakari Raisanen ([id number]) on 12/03/2013 11:22:57 PM            MDM   1. Hypercholesteremia   2. Anxiety   3. Hyperglycemia   4. Leukopenia   5. Thrombocytopenia   6. Renal insufficiency    Chest pain and that technically meets the criteria for unstable angina. However, he has only had pain with significant exertion so I do not feel that he needs to be aggressively treated with heparin and nitroglycerin. Initial troponin is negative and patient will need to be admitted. Old records are reviewed and he had a catheterization in September 2013 at which time he had a 30-50% in-stent stenosis of a stent in the LAD. Case is discussed with Dr. Shanon Brow of [facility] who requests that I discussed the case with cardiology to make sure that they do not wish to have him transferred immediately to [facility]. I discussed case with Dr. Inda Castle who feels that the patient would be appropriately managed here. Case was again discussed with Dr. Shanon Brow who requests that he be given a dose of enoxaparin and started on nitroglycerin  ointment and that he be admitted to a telemetry bed. Laboratory evaluation shows leukopenia and thrombocytopenia but these are chronic and unchanged significantly from baseline. Creatinine has increased slightly from baseline is we'll need to be observed. He is incidentally noted to have a fever but without any clear source of infection.  I personally performed the services described in this documentation, which was scribed in my presence. The recorded information has been reviewed and is accurate.     Delora Fuel, MD 49/20/10 0712

## 2013-12-03 NOTE — ED Notes (Signed)
Pt states he has been moving today w/ more activity than normal. Pt states pressure in his chest that is similar to when he had stents placed.

## 2013-12-03 NOTE — ED Notes (Signed)
Patient complaining of chest pain radiating into left arm since yesterday.

## 2013-12-04 ENCOUNTER — Encounter (HOSPITAL_COMMUNITY): Payer: Self-pay | Admitting: *Deleted

## 2013-12-04 DIAGNOSIS — R509 Fever, unspecified: Secondary | ICD-10-CM | POA: Diagnosis present

## 2013-12-04 DIAGNOSIS — E871 Hypo-osmolality and hyponatremia: Secondary | ICD-10-CM | POA: Diagnosis present

## 2013-12-04 DIAGNOSIS — I200 Unstable angina: Secondary | ICD-10-CM

## 2013-12-04 DIAGNOSIS — D72819 Decreased white blood cell count, unspecified: Secondary | ICD-10-CM | POA: Diagnosis present

## 2013-12-04 DIAGNOSIS — I359 Nonrheumatic aortic valve disorder, unspecified: Secondary | ICD-10-CM

## 2013-12-04 DIAGNOSIS — J09X2 Influenza due to identified novel influenza A virus with other respiratory manifestations: Secondary | ICD-10-CM | POA: Diagnosis present

## 2013-12-04 DIAGNOSIS — E119 Type 2 diabetes mellitus without complications: Secondary | ICD-10-CM

## 2013-12-04 DIAGNOSIS — D696 Thrombocytopenia, unspecified: Secondary | ICD-10-CM

## 2013-12-04 DIAGNOSIS — N179 Acute kidney failure, unspecified: Secondary | ICD-10-CM | POA: Diagnosis present

## 2013-12-04 LAB — GLUCOSE, CAPILLARY
Glucose-Capillary: 182 mg/dL — ABNORMAL HIGH (ref 70–99)
Glucose-Capillary: 245 mg/dL — ABNORMAL HIGH (ref 70–99)
Glucose-Capillary: 199 mg/dL — ABNORMAL HIGH (ref 70–99)
Glucose-Capillary: 240 mg/dL — ABNORMAL HIGH (ref 70–99)

## 2013-12-04 LAB — URINALYSIS, ROUTINE W REFLEX MICROSCOPIC
Bilirubin Urine: NEGATIVE
Glucose, UA: 250 mg/dL — AB
Hgb urine dipstick: NEGATIVE
Ketones, ur: NEGATIVE mg/dL
LEUKOCYTES UA: NEGATIVE
Nitrite: NEGATIVE
Protein, ur: NEGATIVE mg/dL
SPECIFIC GRAVITY, URINE: 1.025 (ref 1.005–1.030)
UROBILINOGEN UA: 0.2 mg/dL (ref 0.0–1.0)
pH: 5.5 (ref 5.0–8.0)

## 2013-12-04 LAB — INFLUENZA PANEL BY PCR (TYPE A & B)
H1N1 flu by pcr: NOT DETECTED
INFLBPCR: NEGATIVE
Influenza A By PCR: POSITIVE — AB

## 2013-12-04 LAB — TROPONIN I

## 2013-12-04 LAB — CBC
HEMATOCRIT: 37 % — AB (ref 39.0–52.0)
Hemoglobin: 13 g/dL (ref 13.0–17.0)
MCH: 31.6 pg (ref 26.0–34.0)
MCHC: 35.1 g/dL (ref 30.0–36.0)
MCV: 89.8 fL (ref 78.0–100.0)
PLATELETS: 106 10*3/uL — AB (ref 150–400)
RBC: 4.12 MIL/uL — ABNORMAL LOW (ref 4.22–5.81)
RDW: 13.3 % (ref 11.5–15.5)
WBC: 2.6 10*3/uL — ABNORMAL LOW (ref 4.0–10.5)

## 2013-12-04 LAB — BASIC METABOLIC PANEL
BUN: 17 mg/dL (ref 6–23)
CO2: 25 mEq/L (ref 19–32)
Calcium: 9.1 mg/dL (ref 8.4–10.5)
Chloride: 100 mEq/L (ref 96–112)
Creatinine, Ser: 1.01 mg/dL (ref 0.50–1.35)
GFR calc Af Amer: >90 mL/min (ref >90)
GFR, EST NON AFRICAN AMERICAN: 82 mL/min — AB (ref >90)
Glucose, Bld: 196 mg/dL — ABNORMAL HIGH (ref 70–99)
POTASSIUM: 3.5 meq/L — AB (ref 3.7–5.3)
Sodium: 138 mEq/L (ref 137–147)

## 2013-12-04 LAB — LIPID PANEL
Cholesterol: 158 mg/dL (ref 0–200)
HDL: 27 mg/dL — ABNORMAL LOW (ref >39)
LDL Cholesterol: 102 mg/dL — ABNORMAL HIGH (ref 0–99)
Total CHOL/HDL Ratio: 5.9 RATIO
Triglycerides: 143 mg/dL (ref <150)
VLDL: 29 mg/dL (ref 0–40)

## 2013-12-04 LAB — HEMOGLOBIN A1C
HEMOGLOBIN A1C: 8.2 % — AB (ref <5.7)
MEAN PLASMA GLUCOSE: 189 mg/dL — AB (ref <117)

## 2013-12-04 MED ORDER — GABAPENTIN 300 MG PO CAPS
600.0000 mg | ORAL_CAPSULE | Freq: Three times a day (TID) | ORAL | Status: DC
  Administered 2013-12-04 – 2013-12-05 (×4): 600 mg via ORAL
  Filled 2013-12-04 (×4): qty 2

## 2013-12-04 MED ORDER — ONDANSETRON HCL 4 MG/2ML IJ SOLN
4.0000 mg | Freq: Four times a day (QID) | INTRAMUSCULAR | Status: DC | PRN
  Administered 2013-12-04: 4 mg via INTRAVENOUS

## 2013-12-04 MED ORDER — SODIUM CHLORIDE 0.9 % IJ SOLN: 3.0000 mL | INTRAMUSCULAR | Status: DC | PRN

## 2013-12-04 MED ORDER — INFLUENZA VAC SPLIT QUAD 0.5 ML IM SUSP
0.5000 mL | INTRAMUSCULAR | Status: AC
  Administered 2013-12-05: 0.5 mL via INTRAMUSCULAR
  Filled 2013-12-04: qty 0.5

## 2013-12-04 MED ORDER — INSULIN ASPART 100 UNIT/ML ~~LOC~~ SOLN: 2.0000 [IU] | Freq: Three times a day (TID) | SUBCUTANEOUS | Status: DC

## 2013-12-04 MED ORDER — RAMIPRIL 2.5 MG PO CAPS
5.0000 mg | ORAL_CAPSULE | Freq: Every day | ORAL | Status: DC
  Administered 2013-12-04 – 2013-12-05 (×2): 5 mg via ORAL
  Filled 2013-12-04 (×2): qty 2

## 2013-12-04 MED ORDER — METOPROLOL TARTRATE 25 MG PO TABS
25.0000 mg | ORAL_TABLET | Freq: Two times a day (BID) | ORAL | Status: DC
  Administered 2013-12-04 – 2013-12-05 (×4): 25 mg via ORAL
  Filled 2013-12-04 (×4): qty 1

## 2013-12-04 MED ORDER — PANTOPRAZOLE SODIUM 40 MG PO TBEC
40.0000 mg | DELAYED_RELEASE_TABLET | Freq: Every day | ORAL | Status: DC
  Administered 2013-12-04 – 2013-12-05 (×2): 40 mg via ORAL
  Filled 2013-12-04: qty 1

## 2013-12-04 MED ORDER — ASPIRIN 81 MG PO CHEW
81.0000 mg | CHEWABLE_TABLET | Freq: Every day | ORAL | Status: DC
  Administered 2013-12-04 – 2013-12-05 (×2): 81 mg via ORAL
  Filled 2013-12-04 (×2): qty 1

## 2013-12-04 MED ORDER — INSULIN GLARGINE 100 UNIT/ML ~~LOC~~ SOLN
60.0000 [IU] | Freq: Two times a day (BID) | SUBCUTANEOUS | Status: DC
  Administered 2013-12-04 – 2013-12-05 (×4): 60 [IU] via SUBCUTANEOUS
  Filled 2013-12-04 (×8): qty 0.6

## 2013-12-04 MED ORDER — NITROGLYCERIN 2 % TD OINT
0.5000 [in_us] | TOPICAL_OINTMENT | Freq: Three times a day (TID) | TRANSDERMAL | Status: DC
Start: 2013-12-04 — End: 2013-12-04
  Administered 2013-12-04: 0.5 [in_us] via TOPICAL
  Filled 2013-12-04: qty 1

## 2013-12-04 MED ORDER — ENOXAPARIN SODIUM 100 MG/ML ~~LOC~~ SOLN
100.0000 mg | Freq: Two times a day (BID) | SUBCUTANEOUS | Status: DC
  Administered 2013-12-04 – 2013-12-05 (×3): 100 mg via SUBCUTANEOUS
  Filled 2013-12-04 (×3): qty 1

## 2013-12-04 MED ORDER — NITROGLYCERIN 0.4 MG SL SUBL: 0.4000 mg | SUBLINGUAL_TABLET | SUBLINGUAL | Status: DC | PRN

## 2013-12-04 MED ORDER — SODIUM CHLORIDE 0.9 % IJ SOLN
3.0000 mL | Freq: Two times a day (BID) | INTRAMUSCULAR | Status: DC
  Administered 2013-12-04 – 2013-12-05 (×3): 3 mL via INTRAVENOUS

## 2013-12-04 MED ORDER — ALBUTEROL SULFATE (2.5 MG/3ML) 0.083% IN NEBU: 2.5000 mg | INHALATION_SOLUTION | RESPIRATORY_TRACT | Status: DC | PRN

## 2013-12-04 MED ORDER — REGADENOSON 0.4 MG/5ML IV SOLN
0.4000 mg | Freq: Once | INTRAVENOUS | Status: DC
  Filled 2013-12-04: qty 5

## 2013-12-04 MED ORDER — GABAPENTIN 600 MG PO TABS
600.0000 mg | ORAL_TABLET | Freq: Three times a day (TID) | ORAL | Status: DC
  Filled 2013-12-04 (×2): qty 1

## 2013-12-04 MED ORDER — SODIUM CHLORIDE 0.9 % IV SOLN: 250.0000 mL | INTRAVENOUS | Status: DC | PRN

## 2013-12-04 MED ORDER — ACETAMINOPHEN 325 MG PO TABS
650.0000 mg | ORAL_TABLET | Freq: Once | ORAL | Status: DC
  Filled 2013-12-04: qty 2

## 2013-12-04 MED ORDER — MELOXICAM 7.5 MG PO TABS
15.0000 mg | ORAL_TABLET | Freq: Every day | ORAL | Status: DC
  Administered 2013-12-04 – 2013-12-05 (×2): 15 mg via ORAL
  Filled 2013-12-04: qty 1
  Filled 2013-12-04 (×2): qty 2

## 2013-12-04 MED ORDER — POTASSIUM CHLORIDE CRYS ER 20 MEQ PO TBCR
40.0000 meq | EXTENDED_RELEASE_TABLET | Freq: Once | ORAL | Status: AC
  Administered 2013-12-04: 40 meq via ORAL
  Filled 2013-12-04: qty 2

## 2013-12-04 MED ORDER — CITALOPRAM HYDROBROMIDE 20 MG PO TABS
20.0000 mg | ORAL_TABLET | Freq: Every day | ORAL | Status: DC
  Administered 2013-12-04 – 2013-12-05 (×2): 20 mg via ORAL
  Filled 2013-12-04 (×2): qty 1

## 2013-12-04 MED ORDER — INSULIN ASPART 100 UNIT/ML ~~LOC~~ SOLN
0.0000 [IU] | Freq: Every day | SUBCUTANEOUS | Status: DC
  Administered 2013-12-04: 2 [IU] via SUBCUTANEOUS

## 2013-12-04 MED ORDER — INSULIN ASPART 100 UNIT/ML ~~LOC~~ SOLN
0.0000 [IU] | Freq: Three times a day (TID) | SUBCUTANEOUS | Status: DC
  Administered 2013-12-04: 5 [IU] via SUBCUTANEOUS
  Administered 2013-12-04: 3 [IU] via SUBCUTANEOUS
  Administered 2013-12-05: 5 [IU] via SUBCUTANEOUS

## 2013-12-04 MED ORDER — ACETAMINOPHEN 325 MG PO TABS
650.0000 mg | ORAL_TABLET | Freq: Four times a day (QID) | ORAL | Status: DC | PRN
  Administered 2013-12-04: 650 mg via ORAL
  Filled 2013-12-04: qty 2

## 2013-12-04 MED ORDER — OSELTAMIVIR PHOSPHATE 75 MG PO CAPS
75.0000 mg | ORAL_CAPSULE | Freq: Two times a day (BID) | ORAL | Status: DC
  Administered 2013-12-04 – 2013-12-05 (×4): 75 mg via ORAL
  Filled 2013-12-04 (×4): qty 1

## 2013-12-04 MED ORDER — ATORVASTATIN CALCIUM 40 MG PO TABS
80.0000 mg | ORAL_TABLET | Freq: Every day | ORAL | Status: DC
  Administered 2013-12-04: 80 mg via ORAL
  Filled 2013-12-04: qty 2

## 2013-12-04 NOTE — Progress Notes (Signed)
*  PRELIMINARY RESULTS* Echocardiogram 2D Echocardiogram has been performed.  [place], [place] 12/04/2013, 10:20 AM

## 2013-12-04 NOTE — Progress Notes (Signed)
Patient seen, independently examined and chart reviewed. I agree with exam, assessment and plan discussed with Dyanne Carrel, NP.  58 year old man with history of coronary artery disease, stent placement in the past presented with exertional chest pain and upper respiratory symptoms. Initial evaluation was concerning for unstable angina, patient was started on heparin and Tamiflu and referred for admission.  PMH CAD, DM, TIA  No chest pain since admission. No complaints at this point. He appears calm and comfortable.  Cardiovascular regular rate and rhythm. Respiratory clear to auscultation bilaterally. No wheezes, rales or rhonchi.   We will continue treatment for influenza, currently appears relatively asymptomatic.  Cardiology consultation appreciated, nuclear medicine stress test planned for the morning. Leukopenia and thrombocytopenia. Chronic processes dating back to 03/2012 at least. He should be followed up in the outpatient setting.   Murray Hodgkins, MD [facility] [phone]

## 2013-12-04 NOTE — Progress Notes (Signed)
ANTICOAGULATION CONSULT NOTE - Initial Consult  Pharmacy Consult for Lovenox Indication: chest pain/ACS  No Known Allergies  Patient Measurements: Height: 5' 9"  (175.3 cm) Weight: 230 lb 13.2 oz (104.7 kg) IBW/kg (Calculated) : 70.7  Vital Signs: Temp: 98.1 F (36.7 C) (01/26 1216) Temp src: Oral (01/26 0632) BP: 115/69 mmHg (01/26 0632) Pulse Rate: 77 (01/26 0632)  Labs:  Recent Labs  12/03/13 2154 12/04/13 0230  HGB 14.2  --   HCT 39.6  --   PLT 95*  --   CREATININE 1.38*  --   TROPONINI  --  <0.30   Estimated Creatinine Clearance: 72.1 ml/min (by C-G formula based on Cr of 1.38).  Medical History: Past Medical History  Diagnosis Date  . Coronary atherosclerosis of native coronary artery     a. h/o LAD & OM stenting;  b. 2007 Cath: nonobs dzs, patent stents;  c. 07/2012 neg MV, EF 61%.  . Type 2 diabetes mellitus   . Asthma   . Essential hypertension, benign   . TIA (transient ischemic attack)   . Anxiety   . Hypercholesteremia   . Acid reflux    Medications:  Scheduled:  . aspirin  81 mg Oral Daily  . atorvastatin  80 mg Oral QPC supper  . citalopram  20 mg Oral Daily  . enoxaparin (LOVENOX) injection  100 mg Subcutaneous Q12H  . gabapentin  600 mg Oral TID  . [START ON 12/05/2013] influenza vac split quadrivalent PF  0.5 mL Intramuscular Tomorrow-1000  . insulin aspart  2-30 Units Subcutaneous TID AC  . insulin glargine  60 Units Subcutaneous BID  . meloxicam  15 mg Oral Daily  . metoprolol tartrate  25 mg Oral BID  . nitroGLYCERIN  0.5 inch Topical Q8H  . oseltamivir  75 mg Oral BID  . ramipril  5 mg Oral Daily  . sodium chloride  3 mL Intravenous Q12H  . sodium chloride  3 mL Intravenous Q12H   Assessment: 58yo male admitted with c/o chest pain.  Pt has h/o CAD.  Obese with good renal fxn.  Estimated Creatinine Clearance: 72.1 ml/min (by C-G formula based on Cr of 1.38).  Goal of Therapy:  Anti-Xa level 0.6-1 units/ml 4hrs after LMWH dose  given Monitor platelets by anticoagulation protocol: Yes   Plan:  Lovenox 20m/Kg SQ q12hrs Monitor CBC  Lafayette Dunlevy A 12/04/2013,8:23 AM

## 2013-12-04 NOTE — Consult Note (Signed)
CARDIOLOGY CONSULT NOTE   Patient ID: [name] [name] MRN: [id number] DOB/AGE: 06/25/1956 58 y.o.  Admit Date: 12/03/2013 Referring Physician: PTH Primary Physician: Deloria Lair, MD Consulting Cardiologist: Harl Bowie, MD Primary Cardiologist: Domenic Polite MD-[place] office. Reason for Consultation: Chest Pain  Clinical Summary [name] is a 58 y.o.male admitted with subscapular chest pain and lower respiratory symptoms of coughing and shortness of breath, with asthma symptoms, and fever of 102.6.He was found to be positive of Influenza A. EKG demonstrated tachycardia rate of 111 bpm.  He has been started on Tamiflu and fluids. Cardiac enzymes are found to be negative X 3. CXR was negative for CHF or pneumonia.        The patient was in the process of moving over the two days prior to admission. He was lifting heavy boxes. The first day had what he described as an asthma attack with chest pressure lasting about 20 minutes going away on is own. The next day, again carrying boxes, he felt chest pressure lasting approximately 1 hour. He states the second episode reminded him of the pain he experienced prior to stent placement. The pain radiated to his arms. He did not take NTG. After second episode, he presented to ER.      He has has no recurrence of chest pain since admission, but states " I haven't done anything."     He has a known history of non-obstructive CAD per cardiac cath in Oct of 2013, with hx of stent to LAD. There was moderate ISR at time of cath with normal LV fx. Medical therapy was continued.     No Known Allergies  Medications Scheduled Medications: . aspirin  81 mg Oral Daily  . atorvastatin  80 mg Oral QPC supper  . citalopram  20 mg Oral Daily  . enoxaparin (LOVENOX) injection  100 mg Subcutaneous Q12H  . gabapentin  600 mg Oral TID  . [START ON 12/05/2013] influenza vac split quadrivalent PF  0.5 mL Intramuscular Tomorrow-1000  . insulin aspart  2-30 Units Subcutaneous  TID AC  . insulin glargine  60 Units Subcutaneous BID  . meloxicam  15 mg Oral Daily  . metoprolol tartrate  25 mg Oral BID  . nitroGLYCERIN  0.5 inch Topical Q8H  . oseltamivir  75 mg Oral BID  . ramipril  5 mg Oral Daily  . sodium chloride  3 mL Intravenous Q12H  . sodium chloride  3 mL Intravenous Q12H     Infusions:     PRN Medications:  sodium chloride, acetaminophen, albuterol, sodium chloride   Past Medical History  Diagnosis Date  . Coronary atherosclerosis of native coronary artery     a. h/o LAD & OM stenting;  b. 2007 Cath: nonobs dzs, patent stents;  c. 07/2012 neg MV, EF 61%.  . Type 2 diabetes mellitus   . Asthma   . Essential hypertension, benign   . TIA (transient ischemic attack)   . Anxiety   . Hypercholesteremia   . Acid reflux     Past Surgical History  Procedure Laterality Date  . Neck surgery    . Carpal tunnel release    . Shoulder arthroscopy    . Knee surgery  4/13  . Elbow surgery    . Coronary angioplasty with stent placement      History reviewed. No pertinent family history.  Social History [name] reports that he has never smoked. He has never used smokeless tobacco. [name] reports that he does  not drink alcohol.  Review of Systems Otherwise reviewed and negative except as outlined.  Physical Examination Blood pressure 115/69, pulse 77, temperature 98.1 F (36.7 C), temperature source Oral, resp. rate 18, height 5' 9"  (1.753 m), weight 230 lb 13.2 oz (104.7 kg), SpO2 92.00%. No intake or output data in the 24 hours ending 12/04/13 0829  Telemetry:  HEENT: Conjunctiva and lids normal, oropharynx clear with moist mucosa. Neck: Supple, no elevated JVP or carotid bruits, no thyromegaly. Lungs: Clear to auscultation, nonlabored breathing at rest. Cardiac: Regular rate and rhythm, no S3 or significant systolic murmur, no pericardial rub. Abdomen: Soft, nontender, no hepatomegaly, bowel sounds present, no guarding or  rebound. Extremities: No pitting edema, distal pulses 2+. Skin: Warm and dry. Musculoskeletal: No kyphosis. Neuropsychiatric: Alert and oriented x3, affect grossly appropriate.  Prior Cardiac Testing/Procedures  1.Cardiac catt 07/19/2012 Coronary dominance: Right  Left Main: Normal in size and free of significant disease.  Left Anterior Descending (LAD): Normal in size with mild calcifications. No significant disease is noted proximally. It appears that there are 2 non overlapped stents in the midsegment. There is a 40-50 % in-stent restenosis in the more proximal stent. And a second stent, there is 30% in-stent restenosis at the distal edge. The distal LAD has diffuse 20% disease.  1st diagonal (D1): Supplied by a ramus Nicolo [name] [name].  2nd diagonal (D2): Normal in size with mild 20% ostial stenosis.  3rd diagonal (D3): Very small in size.  First septal perforator has 50-60% ostial stenosis.  Circumflex (LCx): Normal in size and nondominant. The vessel has minor irregularities.  1st obtuse marginal: Normal in size with 50-60% ostial stenosis.  2nd obtuse marginal: Normal in size without significant disease.  3rd obtuse marginal: Normal in size with minor irregularities.  Ramus Intermedius: Small to medium in size with minor irregularities.  Right Coronary Artery: Normal in size and dominant. The vessel has minor irregularities with 20% distal disease.  posterior descending artery: Normal in size with minor irregularities.  posterior lateral [name] [name]: Minor irregularities. Left ventriculography: Left ventricular systolic function is normal , LVEF is estimated at 55-60 %, there is no significant mitral regurgitation  Final Conclusions:  1. Patent LAD stents with mild to moderate instent restenosis. Moderate disease in the OM1. No evidence of obstructive CAD.  2. Normal LV systolic function.  Lab Results  Basic Metabolic Panel:  Recent Labs Lab 12/03/13 2154  NA 133*  K 3.8  CL 94*  CO2 25   GLUCOSE 310*  BUN 20  CREATININE 1.38*  CALCIUM 9.3    Liver Function Tests: No results found for this basename: AST, ALT, ALKPHOS, BILITOT, PROT, ALBUMIN,  in the last 168 hours  CBC:  Recent Labs Lab 12/03/13 2154  WBC 2.4*  HGB 14.2  HCT 39.6  MCV 88.8  PLT 95*    Cardiac Enzymes:  Recent Labs Lab 12/04/13 0230 12/04/13 0742  TROPONINI <0.30 <0.30     Radiology: Dg Chest Port 1 View  12/03/2013   CLINICAL DATA:  Chest pain  EXAM: PORTABLE CHEST - 1 VIEW  COMPARISON:  07/17/2013  FINDINGS: The heart size and mediastinal contours are within normal limits. Both lungs are clear. The visualized skeletal structures are unremarkable.  IMPRESSION: No active disease.   Electronically Signed   By: Daryll Brod M.D.   On: 12/03/2013 22:18     TMH:DQQIW tachycardia.   Impression and Recommendations  1.  Chest Pain:  He has typical symptoms of stable angina. However this  is confounded by flu symptoms and fever as well. Cardiac enzymes argue against ACS. EKG is normal with the exception of tachycardia. He has been pain free since admission. Uncertain that he needs to have cardiac testing at this time in the setting of acute illness, however, with known CAD, and ongoing risk factors.      Echocardiogram is pending. If significant change in LV fx. More recommendations will be made. For now metoprolol, statin, ASA.   2. Influenza; Tested positive for Influenza A. Treated with Tamiflu. He is afebrile.  3. Hypertension:  BP is well controlled.   Signed: Phill Myron. Purcell Nails NP [facility] 12/04/2013, 8:29 AM Co-Sign MD  58 yo male hx of CAD with prior stent to LAD, DM, HTN, HL admitted with chest pain, flu like symptoms, and fever. From cardiac standpoint last cath 07/2012 with patent LAD stent with mild to moderate ISR, no current obstructive disease and LVEF 55-60% by cath. Echo 06/2012 with LVEF 68-12%, grade I diastolic dysfunction.   Reports 2 days ago he  experienced significant chest pressure and wheezing he attributed to an asthma attack, improved with inhalers. Reports few days later episode of chest tightness in mid chest with associated SOB and dizziness while carrying boxes. He rested and symptoms resolved. Later that day while carrying boxes experienced a similar episode of severe chest pressure he states was similar to pain from prior stents. He denies any recent prior history of exertional angina. He has also had fever and flu like symptoms over the last few days and has tested flu positive this admission. Cardiac enzymes negative x 3, EKG without ischemic changes.   Difficult to tease out the etiology of his exertional chest pain in the setting of known CAD, asthma, and current influenza infection. It is concerning that symptoms are similar to prior chest pain around the time of his previous stent, and he is having new exertional symptoms. Last cath showed moderate in-stent restenosis, this may have progressed. Influenza infection does have an association with ACS and precipitating destabilization of plaques. Will obtain lexiscan in morning to further risk stratify. Patient has chronic left knee pain and weakness and is not able to run on treadmill.    Carlyle Dolly MD

## 2013-12-04 NOTE — Progress Notes (Signed)
[facility] PROGRESS NOTE  [name] [name] WUJ:[id number] DOB: 01-29-1956 DOA: 12/03/2013 PCP: Deloria Lair, MD  Assessment/Plan: Unstable angina- No further episodes since admission. Troponin negative  X3. EKG without acute changes. Chest xray negative for active disease. Known hx of non-obstructive CAD per cath 08/3012 with hx stent. Await echo results. Await cardiology recommendations. Continue lovenox, nitro paste, BB, asa and statin. Active Problems:   Influenza A pcr: continue tamiflu for 5 days.   HYPERLIPIDEMIA-MIXED : check lipid panel. Continue statin  Essential hypertension, benign: fair control. SBP range 115-157. Home meds include lopressor and ramipril. Will continue. Monitor.   CAD, NATIVE VESSEL : see #1.  Type 2 diabetes mellitus:check A1C. Continue home lantus. Use SSI for optimal glycemic control.   Anxiety: appears at base.      Code Status: code Family Communication: none present Disposition Plan: home hopefully in am   Consultants:  cardiology  Procedures:  none  Antibiotics:  none  HPI/Subjective: Lying in bed. No complaints  Objective: Filed Vitals:   12/04/13 0632  BP: 115/69  Pulse: 77  Temp: 98.1 F (36.7 C)  Resp: 18   No intake or output data in the 24 hours ending 12/04/13 0951 Filed Weights   12/03/13 2130 12/04/13 0058  Weight: 102.059 kg (225 lb) 104.7 kg (230 lb 13.2 oz)    Exam:   General:  Well nourished calm appears comfortable  Cardiovascular: RRR occasional ectopic beat no m/g/r no LE edema  Respiratory: normal effort BS slightly diminished no wheeze no rhonchi  Abdomen: round soft +BS non-tender to palpation  Musculoskeletal: no clubbing no cyanosis   Data Reviewed: Basic Metabolic Panel:  Recent Labs Lab 12/03/13 2154  NA 133*  K 3.8  CL 94*  CO2 25  GLUCOSE 310*  BUN 20  CREATININE 1.38*  CALCIUM 9.3   Liver Function Tests: No results found for this basename: AST, ALT, ALKPHOS,  BILITOT, PROT, ALBUMIN,  in the last 168 hours No results found for this basename: LIPASE, AMYLASE,  in the last 168 hours No results found for this basename: AMMONIA,  in the last 168 hours CBC:  Recent Labs Lab 12/03/13 2154  WBC 2.4*  HGB 14.2  HCT 39.6  MCV 88.8  PLT 95*   Cardiac Enzymes:  Recent Labs Lab 12/04/13 0230 12/04/13 0742  TROPONINI <0.30 <0.30   BNP (last 3 results) No results found for this basename: PROBNP,  in the last 8760 hours CBG:  Recent Labs Lab 12/04/13 0807  GLUCAP 182*    No results found for this or any previous visit (from the past 240 hour(s)).   Studies: Dg Chest Port 1 View  12/03/2013   CLINICAL DATA:  Chest pain  EXAM: PORTABLE CHEST - 1 VIEW  COMPARISON:  07/17/2013  FINDINGS: The heart size and mediastinal contours are within normal limits. Both lungs are clear. The visualized skeletal structures are unremarkable.  IMPRESSION: No active disease.   Electronically Signed   By: Daryll Brod M.D.   On: 12/03/2013 22:18    Scheduled Meds: . aspirin  81 mg Oral Daily  . atorvastatin  80 mg Oral QPC supper  . citalopram  20 mg Oral Daily  . enoxaparin (LOVENOX) injection  100 mg Subcutaneous Q12H  . gabapentin  600 mg Oral TID  . [START ON 12/05/2013] influenza vac split quadrivalent PF  0.5 mL Intramuscular Tomorrow-1000  . insulin aspart  2-30 Units Subcutaneous TID AC  . insulin glargine  60 Units Subcutaneous BID  .  meloxicam  15 mg Oral Daily  . metoprolol tartrate  25 mg Oral BID  . nitroGLYCERIN  0.5 inch Topical Q8H  . oseltamivir  75 mg Oral BID  . ramipril  5 mg Oral Daily  . sodium chloride  3 mL Intravenous Q12H  . sodium chloride  3 mL Intravenous Q12H   Continuous Infusions:   Principal Problem:   Unstable angina Active Problems:   HYPERLIPIDEMIA-MIXED   Essential hypertension, benign   CAD, NATIVE VESSEL   Type 2 diabetes mellitus   Anxiety   Chest pain   Fever, unspecified   Leukopenia   Hyponatremia    Acute renal failure   Influenza due to identified novel influenza A virus with other respiratory manifestations    Time spent: 30 minutes    [facility] Pager [phone]. If 7PM-7AM, please contact night-coverage at [email or web address], password [facility] 12/04/2013, 9:51 AM  LOS: 1 day

## 2013-12-05 ENCOUNTER — Inpatient Hospital Stay (HOSPITAL_COMMUNITY): Payer: Medicare Other

## 2013-12-05 ENCOUNTER — Encounter (HOSPITAL_COMMUNITY): Payer: Self-pay

## 2013-12-05 DIAGNOSIS — D72819 Decreased white blood cell count, unspecified: Secondary | ICD-10-CM

## 2013-12-05 DIAGNOSIS — I2510 Atherosclerotic heart disease of native coronary artery without angina pectoris: Principal | ICD-10-CM

## 2013-12-05 LAB — BASIC METABOLIC PANEL
BUN: 19 mg/dL (ref 6–23)
CHLORIDE: 102 meq/L (ref 96–112)
CO2: 26 mEq/L (ref 19–32)
Calcium: 8.9 mg/dL (ref 8.4–10.5)
Creatinine, Ser: 0.99 mg/dL (ref 0.50–1.35)
GFR calc Af Amer: >90 mL/min (ref >90)
GFR calc non Af Amer: >90 mL/min (ref >90)
Glucose, Bld: 213 mg/dL — ABNORMAL HIGH (ref 70–99)
POTASSIUM: 4.2 meq/L (ref 3.7–5.3)
SODIUM: 140 meq/L (ref 137–147)

## 2013-12-05 LAB — CBC
HEMATOCRIT: 39.4 % (ref 39.0–52.0)
Hemoglobin: 14.3 g/dL (ref 13.0–17.0)
MCH: 32 pg (ref 26.0–34.0)
MCHC: 36.3 g/dL — ABNORMAL HIGH (ref 30.0–36.0)
MCV: 88.1 fL (ref 78.0–100.0)
Platelets: 93 10*3/uL — ABNORMAL LOW (ref 150–400)
RBC: 4.47 MIL/uL (ref 4.22–5.81)
RDW: 12.7 % (ref 11.5–15.5)
WBC: 2.4 10*3/uL — AB (ref 4.0–10.5)

## 2013-12-05 LAB — GLUCOSE, CAPILLARY
GLUCOSE-CAPILLARY: 181 mg/dL — AB (ref 70–99)
GLUCOSE-CAPILLARY: 233 mg/dL — AB (ref 70–99)
Glucose-Capillary: 232 mg/dL — ABNORMAL HIGH (ref 70–99)

## 2013-12-05 MED ORDER — SODIUM CHLORIDE 0.9 % IJ SOLN
INTRAMUSCULAR | Status: AC
Start: 2013-12-05 — End: 2013-12-05
  Administered 2013-12-05: 10 mL via INTRAVENOUS
  Filled 2013-12-05: qty 10

## 2013-12-05 MED ORDER — TECHNETIUM TC 99M SESTAMIBI - CARDIOLITE
30.0000 | Freq: Once | INTRAVENOUS | Status: AC | PRN
  Administered 2013-12-05: 09:00:00 30 via INTRAVENOUS

## 2013-12-05 MED ORDER — TECHNETIUM TC 99M SESTAMIBI - CARDIOLITE
10.0000 | Freq: Once | INTRAVENOUS | Status: AC | PRN
  Administered 2013-12-05: 10 via INTRAVENOUS

## 2013-12-05 MED ORDER — OSELTAMIVIR PHOSPHATE 75 MG PO CAPS: 75.0000 mg | ORAL_CAPSULE | Freq: Two times a day (BID) | ORAL | Status: DC

## 2013-12-05 MED ORDER — REGADENOSON 0.4 MG/5ML IV SOLN
INTRAVENOUS | Status: AC
  Administered 2013-12-05: 0.4 mg via INTRAVENOUS
  Filled 2013-12-05: qty 5

## 2013-12-05 NOTE — Progress Notes (Addendum)
Patient ID: [name] [name], male   DOB: 1955-11-19, 58 y.o.   MRN: [id number]     Subjective:   No chest pain overnight   Objective:   Temp:  [98 F (36.7 C)-98.9 F (37.2 C)] 98 F (36.7 C) (01/27 0550) Pulse Rate:  [65-82] 65 (01/27 0550) Resp:  [18] 18 (01/27 0550) BP: (118-145)/(74-79) 118/74 mmHg (01/27 0550) SpO2:  [91 %-97 %] 96 % (01/27 0550) Last BM Date: 12/04/13  [facility] Weights   12/03/13 2130 12/04/13 0058  Weight: 225 lb (102.059 kg) 230 lb 13.2 oz (104.7 kg)    Intake/Output Summary (Last 24 hours) at 12/05/13 0827 Last data filed at 12/04/13 1500  Gross per 24 hour  Intake    480 ml  Output      3 ml  Net    477 ml    Telemetry: sinus rhythm  Exam:  General: NAD  Resp: CTAB  Cardiac:RRR, no m/r/g, no JVD, no carotid bruits  GI: abdomen soft, NT, ND  MSK: extremities are warm, no edema  Neuro: no focal deficits   Lab Results:  Basic Metabolic Panel:  Recent Labs Lab 12/03/13 2154 12/04/13 0742 12/05/13 0546  NA 133* 138 140  K 3.8 3.5* 4.2  CL 94* 100 102  CO2 25 25 26   GLUCOSE 310* 196* 213*  BUN 20 17 19   CREATININE 1.38* 1.01 0.99  CALCIUM 9.3 9.1 8.9    Liver Function Tests: No results found for this basename: AST, ALT, ALKPHOS, BILITOT, PROT, ALBUMIN,  in the last 168 hours  CBC:  Recent Labs Lab 12/03/13 2154 12/04/13 0742 12/05/13 0546  WBC 2.4* 2.6* 2.4*  HGB 14.2 13.0 14.3  HCT 39.6 37.0* 39.4  MCV 88.8 89.8 88.1  PLT 95* 106* 93*    Cardiac Enzymes:  Recent Labs Lab 12/04/13 0230 12/04/13 0742 12/04/13 1315  TROPONINI <0.30 <0.30 <0.30    BNP: No results found for this basename: PROBNP,  in the last 8760 hours  Coagulation: No results found for this basename: INR,  in the last 168 hours  ECG:   Medications:   Scheduled Medications: . aspirin  81 mg Oral Daily  . atorvastatin  80 mg Oral QPC supper  . citalopram  20 mg Oral Daily  . enoxaparin (LOVENOX) injection  100 mg Subcutaneous Q12H    . gabapentin  600 mg Oral TID  . influenza vac split quadrivalent PF  0.5 mL Intramuscular Tomorrow-1000  . insulin aspart  0-15 Units Subcutaneous TID WC  . insulin aspart  0-5 Units Subcutaneous QHS  . insulin glargine  60 Units Subcutaneous BID  . meloxicam  15 mg Oral Daily  . metoprolol tartrate  25 mg Oral BID  . oseltamivir  75 mg Oral BID  . pantoprazole  40 mg Oral Daily  . ramipril  5 mg Oral Daily  . regadenoson  0.4 mg Intravenous Once  . sodium chloride  3 mL Intravenous Q12H  . sodium chloride  3 mL Intravenous Q12H     Infusions:     PRN Medications:  sodium chloride, acetaminophen, albuterol, ondansetron (ZOFRAN) IV, sodium chloride, technetium sestamibi     Assessment/Plan    1. Chest pain - Difficult to tease out the etiology of his exertional chest pain in the setting of known CAD, asthma, and current influenza infection. It is concerning that symptoms are similar to prior chest pain around the time of his previous stent, and he is having new exertional symptoms. Last cath  showed moderate in-stent restenosis, this may have progressed. Influenza infection does have an association with ACS and precipitating destabilization of plaques. Will obtain lexiscan in morning to further risk stratify. Patient has chronic left knee pain and weakness and is not able to run on treadmill. He has no active wheezing on exam.  - further management per stress test results.     Carlyle Dolly, M.D., F.A.C.C.   Addendum 12/05/13 1110AM Lexiscan negative, no evidence ischemia. Ok to discharge from cardiac standpoint, follow up Dr Domenic Polite in 3-4 weeks. Will signoff of inpatient care.    Carlyle Dolly MD

## 2013-12-05 NOTE — Progress Notes (Signed)
Stress Lab Nurses Notes - [place] 12/05/2013 Reason for doing test: Chest Pain and Dyspnea Type of test: Wille Glaser Nurse performing test: Gerrit Halls, RN Nuclear Medicine Tech: Melburn Hake Echo Tech: Not Applicable MD performing test: Dr. Lynann Beaver.Lawrence NP Family MD: Dr. Scotty Court Test explained and consent signed: yes IV started: 20g jelco, Saline lock flushed, No redness or edema and Saline lock from floor Symptoms: SOB Treatment/Intervention: None Reason test stopped: protocol completed After recovery IV was: No redness or edema and Saline Lock flushed Patient to return to [place]. Med at : 9:30 Patient discharged: Transported back to room 325 via wc Patient's Condition upon discharge was: stable Comments: During test BP 121/76 & HR 95.  Recovery BP 123/73 & HR 75.  Symptoms resolved in recovery. Geanie Cooley T

## 2013-12-05 NOTE — Progress Notes (Signed)
No issues overnight. He is feeling well. No chest pain.  He remains afebrile with stable vital signs. No hypoxia. He appears calm and comfortable, ambulates without difficulty. Cardiovascular regular rate and rhythm. No murmur, rub or gallop. Respiratory clear to auscultation bilaterally. No wheezes, rales or rhonchi. Normal respiratory effort.  Capillary blood sugars are stable. Troponin is negative. Basic metabolic panel unremarkable. CBC stable leukopenia and thrombocytopenia. Hemoglobin A1c 8.2.  Discussed with Dr. Harl Bowie. Nuclear medicine test is negative. No further cardiology recommendations. Stable for discharge.  Overall the patient appears clinically improved, we will plan discharge home today.  Complete 5 days total of Tamiflu.  Continue aspirin, Lipitor, metoprolol, Remeron..  Consider outpatient followup for chronic leukopenia and thrombocytopenia dating to at least 03/2012   Murray Hodgkins, MD [facility] [phone]

## 2013-12-05 NOTE — Discharge Summary (Signed)
Physician Discharge Summary  [name] [name] IWL:[id number] DOB: 09/20/1956 DOA: 12/03/2013  PCP: [name] Lair, MD  Admit date: 12/03/2013 Discharge date: 12/05/2013  Time spent: 40 minutes  Recommendations for Outpatient Follow-up:  1. Follow up with PCP regarding what appears to be chronic leukopenia and thrombocytopenia. Recommend close follow up of glycemic control 2. Dr. Domenic [name] in 3-4 weeks for evaluation of symptoms and monitoring CAD/exertional chest pain.  Discharge Diagnoses:  Principal Problem:   Unstable angina Active Problems:   HYPERLIPIDEMIA-MIXED   Essential hypertension, benign   CAD, NATIVE VESSEL   Type 2 diabetes mellitus   Anxiety   Chest pain   Fever, unspecified   Leukopenia   Hyponatremia   Acute renal failure   Influenza due to identified novel influenza A virus with other respiratory manifestations   Discharge Condition: stable  Diet recommendation: heart healthy carb modified  Filed Weights   12/03/13 2130 12/04/13 0058  Weight: 102.059 kg (225 lb) 104.7 kg (230 lb 13.2 oz)    History of present illness:  58 yo male h/o cad stent x 2, dm, htn, hld comes in on 12/03/13 with sscp started the day before lasting over 10mn. Has not had to use ntg in over a year. He started day prior  with uri symptoms and cough, had a mild asthma exac as well the day before. The cp started then. Mainly noticed the pain when he was lifting something over 20 lbs and walking. No le edema or swelling. Coughing a lot with nasal congestion. His wife had also been fighting a resp tract infection also. No fevers. But temp over 102 on arrival to ED.  Hospital Course:  Unstable angina-  Admitted to tele and started on heparin. No further episodes since admission. Troponin negative X3. EKG without acute changes. Chest xray negative for active disease. Known hx of non-obstructive CAD per cath 08/3012 with hx stent. Echo yields mild LVH with EF 60-65% and grade 1 diastolic dysfunction.  Evaluated by cardiology and underwent Lexiscan  stress test which was negative, no evidence of ischemia. Will continue  BB, asa and statin. Follow up with Dr. MDomenic Politein 3-4 weeks Active Problems:  Influenza A pcr: complete tamiflu for 5 days. He is afebrile with stable vital signs. No hypoxia.   HYPERLIPIDEMIA-MIXED : Lipid panel with HDL 27 and LDL 102 otherwise unremarkable.  Continue statin   Essential hypertension, benign: fair control. SBP range 115-157. Home meds include lopressor and ramipril. Will continue.    CAD, NATIVE VESSEL : see #1.  Type 2 diabetes mellitus: A1C 8.2. Continue home lantus. Recommend close OP follow up for optimal glycemic control.  Anxiety: remained stable at baseline during this hospitalization.    Procedures:  Echo 1/26 yielding mild LVH, EF 60-65% and grade 1 diastolic dysfuntin  Lexiscan 1/27 negative, no ishcemia  Consultations:  Cardiology Dr. BHarl [name] Discharge Exam: Filed Vitals:   12/05/13 1003  BP: 131/7  Pulse: 72  Temp:   Resp:     General: well nourished NAD Cardiovascular: RRR No MGR No LE edema Respiratory: normal effort BS clear to ausculation bilaterally no wheeze no crackles  Discharge Instructions  Discharge Orders   Future Appointments Provider Department Dept Phone   02/16/2014 11:00 AM [name] Luster DO [facility] [phone]  Future Orders Complete By Expires   Diet - low sodium heart healthy  As directed    Discharge instructions  As directed    Comments:     Take medications  as directed Follow up with PCP 1-2 weeks for evaluation of leukopenia and thrombocytopenia   Increase activity slowly  As directed        Medication List         aspirin 81 MG tablet  Take 81 mg by mouth daily.     atorvastatin 80 MG tablet  Commonly known as:  LIPITOR  Take 80 mg by mouth daily.     citalopram 20 MG tablet  Commonly known as:  CELEXA  Take 20 mg by mouth daily.     fenofibrate 145  MG tablet  Commonly known as:  TRICOR  Take 145 mg by mouth daily.     fluticasone 50 MCG/ACT nasal spray  Commonly known as:  FLONASE  Place 2 sprays into the nose daily.     Fluticasone-Salmeterol 250-50 MCG/DOSE Aepb  Commonly known as:  ADVAIR  Inhale 1 puff into the lungs every 12 (twelve) hours.     gabapentin 600 MG tablet  Commonly known as:  NEURONTIN  Take 1 tablet (600 mg total) by mouth 3 (three) times daily.     insulin aspart 100 UNIT/ML injection  Commonly known as:  novoLOG  Inject 2-30 Units into the skin 3 (three) times daily before meals.     insulin glargine 100 UNIT/ML injection  Commonly known as:  LANTUS  Inject 60 Units into the skin 2 (two) times daily.     Melatonin 5 MG Tabs  Take 5 mg by mouth at bedtime as needed (Sleep).     meloxicam 15 MG tablet  Commonly known as:  MOBIC  Take 15 mg by mouth daily.     metFORMIN 1000 MG tablet  Commonly known as:  GLUCOPHAGE  Take 1,000 mg by mouth 2 (two) times daily.     metoprolol tartrate 25 MG tablet  Commonly known as:  LOPRESSOR  Take 25 mg by mouth 2 (two) times daily.     nitroGLYCERIN 0.4 MG SL tablet  Commonly known as:  NITROSTAT  Place 0.4 mg under the tongue every 5 (five) minutes as needed.     omeprazole 20 MG capsule  Commonly known as:  PRILOSEC  Take 20 mg by mouth 2 (two) times daily.     oseltamivir 75 MG capsule  Commonly known as:  TAMIFLU  Take 1 capsule (75 mg total) by mouth 2 (two) times daily.     ramipril 5 MG tablet  Commonly known as:  ALTACE  Take 5 mg by mouth daily.       No Known Allergies     Follow-up Information   Follow up with [name] Lesches, MD. Schedule an appointment as soon as possible for a visit in 3 weeks. (call to make appointment)    Specialty:  Cardiology   Contact information:   [address] [place] [address] [phone]       Follow up with [name] B, MD. Schedule an appointment as soon as possible for a visit in 2  weeks. (follow up on leukopenia and thrombocytopenia)    Specialty:  Family Medicine   Contact information:   [address], [place] [address] [phone]        The results of significant diagnostics from this hospitalization (including imaging, microbiology, ancillary and laboratory) are listed below for reference.    Significant Diagnostic Studies: Nm Myocar Single W/spect W/wall Motion And Ef  12/05/2013   CLINICAL DATA:  58 yo male with history of CAD  with chest pain  EXAM: MYOCARDIAL IMAGING WITH SPECT (REST AND PHARMACOLOGIC-STRESS)  GATED LEFT VENTRICULAR WALL MOTION STUDY  LEFT VENTRICULAR EJECTION FRACTION  TECHNIQUE: Standard myocardial SPECT imaging was performed after resting intravenous injection of 10 mCi [id number]. Subsequently, intravenous infusion of Lexiscan was performed under the supervision of the Cardiology staff. At peak effect of the drug, 30 mCi [id number] was injected intravenously and standard myocardial SPECT imaging was performed. Quantitative gated imaging was also performed to evaluate left ventricular wall motion, and estimate left ventricular ejection fraction.  COMPARISON:  None.  FINDINGS: Pharmacological Stress  Baseline EKG showed normal sinus rhythm. After injection blood pressure increased from 67 bpm to 96 bpm and blood pressure increased from 105/72 to 137/79. The test was stopped after injection was complete, the patient did not experience any chest pain. Post-injection showed significant artifact, no specific ischemic changes, and occasional PVCs.  Myocardial Perfusion Imaging  Raw images showed appropriate myocardial radiotracer uptake. There was a small inferoseptal defect seen in the resting and post-injection images, this defect was less prominent in the post-injection images and this area had normal wall motion. Overall consistent with subdiaphragmatic attenuation. There were no other myocardial perfusion defects.  Gated imagaing  showed an EDV of 88 mL, ESV 40 mL, LVEF 54%, TID 0.90. There was normal wall motion.  IMPRESSION: 1.  Negative Lexiscan MPI for ischemia  2. Normal left ventricular systolic function with normal wall motion  3.  Low risk study for major cardiac events.   Electronically Signed   By: JoCarlyle Dolly On: 12/05/2013 11:19   Dg Chest Port 1 View  12/03/2013   CLINICAL DATA:  Chest pain  EXAM: PORTABLE CHEST - 1 VIEW  COMPARISON:  07/17/2013  FINDINGS: The heart size and mediastinal contours are within normal limits. Both lungs are clear. The visualized skeletal structures are unremarkable.  IMPRESSION: No active disease.   Electronically Signed   By: TrDaryll Brod.D.   On: 12/03/2013 22:18    Microbiology: No results found for this or any previous visit (from the past 240 hour(s)).   Labs: Basic Metabolic Panel:  Recent Labs Lab 12/03/13 2154 12/04/13 0742 12/05/13 0546  NA 133* 138 140  K 3.8 3.5* 4.2  CL 94* 100 102  CO2 25 25 26   GLUCOSE 310* 196* 213*  BUN 20 17 19   CREATININE 1.38* 1.01 0.99  CALCIUM 9.3 9.1 8.9   Liver Function Tests: No results found for this basename: AST, ALT, ALKPHOS, BILITOT, PROT, ALBUMIN,  in the last 168 hours No results found for this basename: LIPASE, AMYLASE,  in the last 168 hours No results found for this basename: AMMONIA,  in the last 168 hours CBC:  Recent Labs Lab 12/03/13 2154 12/04/13 0742 12/05/13 0546  WBC 2.4* 2.6* 2.4*  HGB 14.2 13.0 14.3  HCT 39.6 37.0* 39.4  MCV 88.8 89.8 88.1  PLT 95* 106* 93*   Cardiac Enzymes:  Recent Labs Lab 12/04/13 0230 12/04/13 0742 12/04/13 1315  TROPONINI <0.30 <0.30 <0.30   BNP: BNP (last 3 results) No results found for this basename: PROBNP,  in the last 8760 hours CBG:  Recent Labs Lab 12/04/13 1123 12/04/13 1637 12/04/13 2101 12/05/13 0751 12/05/13 1029  GLUCAP 245* 199* 240* 181* 233*       Signed:  BLACK,KAREN M  [facility] 12/05/2013, 11:41 AM

## 2013-12-05 NOTE — Progress Notes (Signed)
Patient taken down for stress test at this time.

## 2013-12-05 NOTE — Progress Notes (Signed)
Patient returned from stress test. Tolerated well. Vital signs stable.

## 2013-12-05 NOTE — Progress Notes (Signed)
Inpatient Diabetes Program Recommendations  AACE/ADA: New Consensus Statement on Inpatient Glycemic Control (2013)  Target Ranges:  Prepandial:   less than 140 mg/dL      Peak postprandial:   less than 180 mg/dL (1-2 hours)      Critically ill patients:  140 - 180 mg/dL   Results for [name] [name], [name] [name] (MRN [id number]) as of 12/05/2013 10:45  Ref. Range 12/04/2013 08:07 12/04/2013 11:23 12/04/2013 16:37 12/04/2013 21:01 12/05/2013 07:51 12/05/2013 10:29  Glucose-Capillary Latest Range: 70-99 mg/dL 182 (H) 245 (H) 199 (H) 240 (H) 181 (H) 233 (H)    Inpatient Diabetes Program Recommendations Correction (SSI): Please increase Novolog correction to resistant scale. Insulin - Meal Coverage: Please order Novolog 4 units TID with meals for meal coverage.  Thanks, Barnie Alderman, RN, MSN, CCRN Diabetes Coordinator Inpatient Diabetes Program [phone] (Team Pager) [phone] (AP office) [phone] [facility] office)

## 2013-12-05 NOTE — Discharge Summary (Signed)
Patient seen, independently examined and chart reviewed. I agree with exam, assessment and plan discussed with Dyanne Carrel, NP.  Overall the patient appears clinically improved, we will plan discharge home today.  Complete 5 days total of Tamiflu.  Continue aspirin, Lipitor, metoprolol  Consider outpatient followup for chronic leukopenia and thrombocytopenia dating to at least 03/2012  Murray Hodgkins, MD [facility] [phone]

## 2014-02-16 ENCOUNTER — Ambulatory Visit: Payer: Medicare Other | Admitting: Neurology

## 2014-02-19 ENCOUNTER — Other Ambulatory Visit: Payer: Self-pay | Admitting: Cardiology

## 2014-02-20 ENCOUNTER — Ambulatory Visit: Payer: Self-pay | Admitting: Neurology

## 2014-03-02 ENCOUNTER — Telehealth: Payer: Self-pay | Admitting: *Deleted

## 2014-03-02 NOTE — Telephone Encounter (Signed)
Yes, I would like him to have the MRI prior to following up unless there is an urgent new issue he needs to discuss. Thanks.

## 2014-03-02 NOTE — Telephone Encounter (Signed)
Called pt and spoke with pt's wife [name] [name] to inform her per Dr. Janann Colonel that the pt needed to still have MRI done prior to the appt unless pt is having a new issue. I advised the wife to call [facility] to set up an appt to have that done and as soon as the pt has that done to call the office to set up an appt with Dr. Janann Colonel. I offer the number to [facility] but pt's wife declined and stated that she could fine it. I advised the wife that if the pt has any other problems, questions or concerns to call the office. Wife verbalized understanding.

## 2014-03-02 NOTE — Telephone Encounter (Signed)
Pt's wife Stanton Kidney calling wondering if the pt needs to have MRI done before coming to his appt on 4/291/5. I explained to the wife that the pt was last seen on 10/14 and MRI was ordered then and sent to [facility] and they tried to contact the pt numerus times, left VM and no return call, order was sent back to [facility] because of this. Please advise

## 2014-03-07 ENCOUNTER — Ambulatory Visit: Payer: Self-pay | Admitting: Neurology

## 2014-04-14 ENCOUNTER — Emergency Department (HOSPITAL_COMMUNITY)
Admission: EM | Admit: 2014-04-14 | Discharge: 2014-04-14 | Disposition: A | Discharge status: Home / Self Care | Payer: Medicare Other | Attending: Emergency Medicine | Admitting: Emergency Medicine

## 2014-04-14 ENCOUNTER — Encounter (HOSPITAL_COMMUNITY): Payer: Self-pay | Admitting: Emergency Medicine

## 2014-04-14 ENCOUNTER — Emergency Department (HOSPITAL_COMMUNITY): Payer: Medicare Other

## 2014-04-14 DIAGNOSIS — Z79899 Other long term (current) drug therapy: Secondary | ICD-10-CM | POA: Insufficient documentation

## 2014-04-14 DIAGNOSIS — Z794 Long term (current) use of insulin: Secondary | ICD-10-CM | POA: Insufficient documentation

## 2014-04-14 DIAGNOSIS — Z791 Long term (current) use of non-steroidal anti-inflammatories (NSAID): Secondary | ICD-10-CM | POA: Insufficient documentation

## 2014-04-14 DIAGNOSIS — R42 Dizziness and giddiness: Secondary | ICD-10-CM | POA: Insufficient documentation

## 2014-04-14 DIAGNOSIS — R110 Nausea: Secondary | ICD-10-CM | POA: Insufficient documentation

## 2014-04-14 DIAGNOSIS — Z8673 Personal history of transient ischemic attack (TIA), and cerebral infarction without residual deficits: Secondary | ICD-10-CM | POA: Insufficient documentation

## 2014-04-14 DIAGNOSIS — Z9861 Coronary angioplasty status: Secondary | ICD-10-CM | POA: Insufficient documentation

## 2014-04-14 DIAGNOSIS — R51 Headache: Secondary | ICD-10-CM | POA: Insufficient documentation

## 2014-04-14 DIAGNOSIS — M549 Dorsalgia, unspecified: Secondary | ICD-10-CM | POA: Insufficient documentation

## 2014-04-14 DIAGNOSIS — M6281 Muscle weakness (generalized): Secondary | ICD-10-CM | POA: Insufficient documentation

## 2014-04-14 DIAGNOSIS — K219 Gastro-esophageal reflux disease without esophagitis: Secondary | ICD-10-CM | POA: Insufficient documentation

## 2014-04-14 DIAGNOSIS — E7800 Pure hypercholesterolemia, unspecified: Secondary | ICD-10-CM | POA: Insufficient documentation

## 2014-04-14 DIAGNOSIS — Z7982 Long term (current) use of aspirin: Secondary | ICD-10-CM | POA: Insufficient documentation

## 2014-04-14 DIAGNOSIS — J45909 Unspecified asthma, uncomplicated: Secondary | ICD-10-CM | POA: Insufficient documentation

## 2014-04-14 DIAGNOSIS — E119 Type 2 diabetes mellitus without complications: Secondary | ICD-10-CM | POA: Insufficient documentation

## 2014-04-14 DIAGNOSIS — R61 Generalized hyperhidrosis: Secondary | ICD-10-CM | POA: Insufficient documentation

## 2014-04-14 DIAGNOSIS — I10 Essential (primary) hypertension: Secondary | ICD-10-CM | POA: Insufficient documentation

## 2014-04-14 DIAGNOSIS — F411 Generalized anxiety disorder: Secondary | ICD-10-CM | POA: Insufficient documentation

## 2014-04-14 DIAGNOSIS — I2510 Atherosclerotic heart disease of native coronary artery without angina pectoris: Secondary | ICD-10-CM | POA: Insufficient documentation

## 2014-04-14 DIAGNOSIS — R079 Chest pain, unspecified: Secondary | ICD-10-CM

## 2014-04-14 LAB — BASIC METABOLIC PANEL
BUN: 19 mg/dL (ref 6–23)
CALCIUM: 9.4 mg/dL (ref 8.4–10.5)
CO2: 25 meq/L (ref 19–32)
CREATININE: 0.97 mg/dL (ref 0.50–1.35)
Chloride: 96 mEq/L (ref 96–112)
GFR calc Af Amer: >90 mL/min (ref >90)
GFR calc non Af Amer: >90 mL/min (ref >90)
GLUCOSE: 390 mg/dL — AB (ref 70–99)
Potassium: 4.7 mEq/L (ref 3.7–5.3)
Sodium: 134 mEq/L — ABNORMAL LOW (ref 137–147)

## 2014-04-14 LAB — CBC
HCT: 40.6 % (ref 39.0–52.0)
Hemoglobin: 14.7 g/dL (ref 13.0–17.0)
MCH: 31.7 pg (ref 26.0–34.0)
MCHC: 36.2 g/dL — ABNORMAL HIGH (ref 30.0–36.0)
MCV: 87.5 fL (ref 78.0–100.0)
PLATELETS: 119 10*3/uL — AB (ref 150–400)
RBC: 4.64 MIL/uL (ref 4.22–5.81)
RDW: 13 % (ref 11.5–15.5)
WBC: 3.2 10*3/uL — ABNORMAL LOW (ref 4.0–10.5)

## 2014-04-14 LAB — TROPONIN I

## 2014-04-14 MED ORDER — ONDANSETRON HCL 4 MG/2ML IJ SOLN: 4.0000 mg | Freq: Once | INTRAMUSCULAR | Status: DC

## 2014-04-14 MED ORDER — ONDANSETRON HCL 4 MG/2ML IJ SOLN
4.0000 mg | Freq: Once | INTRAMUSCULAR | Status: AC
  Administered 2014-04-14: 4 mg via INTRAVENOUS
  Filled 2014-04-14: qty 2

## 2014-04-14 NOTE — ED Provider Notes (Signed)
CSN: [id number]     Arrival date & time 04/14/14  2004 History  This chart was scribed for Richarda Blade, MD by Steva Colder, ED Scribe. The patient was seen in room APA18/APA18 at 10:13 PM.   Chief Complaint  Patient presents with  . Chest Pain    The history is provided by the patient. No language interpreter was used.   HPI Comments: [name] [name] is a 58 y.o. male who presents to the Emergency Department complaining of sharp, intermittent CP that began 3 days ago. Pt states that his episodes of chest pain over the last few days have lasted 2 seconds until today, stating they have lasted up to 2 minutes. Pt states that his CP is worsened with movement and alleviated with rest. Pt states that he has had associated sharp head pains that began yesterday. Pt states that yesterday and today there were 5-6 episodes of head pain that coincided with his CP. Pt has associated symptoms of pallor, nausea, back pain, left sided weakness, and episodes of dizziness for most of this past week. Pt states that he hasn't seen his cardiologist recently. Pt states that he took one NTG today, and has taken a total of 4 NTG this year. He states his prior dose was several months ago. He states that the NTG relieved his pain, but it leaves him with a HA. Pt denies smoking and fever. Pt states that he currently takes Prilosec for CP.   PCP- Deloria Lair, MD  Past Medical History  Diagnosis Date  . Coronary atherosclerosis of native coronary artery     a. h/o LAD & OM stenting;  b. 2007 Cath: nonobs dzs, patent stents;  c. 07/2012 neg MV, EF 61%.  . Type 2 diabetes mellitus   . Asthma   . Essential hypertension, benign   . TIA (transient ischemic attack)   . Anxiety   . Hypercholesteremia   . Acid reflux    Past Surgical History  Procedure Laterality Date  . Neck surgery    . Carpal tunnel release    . Shoulder arthroscopy    . Knee surgery  4/13  . Elbow surgery    . Coronary angioplasty with stent  placement     History reviewed. No pertinent family history. History  Substance Use Topics  . Smoking status: Never Smoker   . Smokeless tobacco: Never Used  . Alcohol Use: No    Review of Systems  Constitutional: Positive for diaphoresis.  Gastrointestinal: Positive for nausea.  Musculoskeletal: Positive for back pain.  Neurological: Positive for dizziness and weakness (left side).  All other systems reviewed and are negative.  Allergies  Review of patient's allergies indicates no known allergies.  Home Medications   Prior to Admission medications   Medication Sig Start Date End Date Taking? Authorizing Provider  aspirin EC 81 MG tablet Take 81 mg by mouth daily.   Yes Historical Provider, MD  atorvastatin (LIPITOR) 80 MG tablet Take 80 mg by mouth daily.  06/09/12  Yes Historical Provider, MD  citalopram (CELEXA) 20 MG tablet Take 20 mg by mouth daily.   Yes Historical Provider, MD  fenofibrate (TRICOR) 145 MG tablet Take 145 mg by mouth daily.   Yes Historical Provider, MD  fluticasone (FLONASE) 50 MCG/ACT nasal spray Place 2 sprays into the nose daily.   Yes Historical Provider, MD  Fluticasone-Salmeterol (ADVAIR) 250-50 MCG/DOSE AEPB Inhale 1 puff into the lungs every 12 (twelve) hours.   Yes Historical Provider, MD  gabapentin (NEURONTIN) 600 MG tablet Take 1 tablet (600 mg total) by mouth 3 (three) times daily. 08/18/13  Yes Hulen Luster, DO  insulin aspart (NOVOLOG) 100 UNIT/ML injection Inject 2-30 Units into the skin 3 (three) times daily before meals.    Yes Historical Provider, MD  Insulin Glargine (LANTUS SOLOSTAR) 100 UNIT/ML Solostar Pen Inject 60 Units into the skin 2 (two) times daily.   Yes Historical Provider, MD  Melatonin 5 MG TABS Take 5 mg by mouth at bedtime as needed (Sleep).   Yes Historical Provider, MD  meloxicam (MOBIC) 15 MG tablet Take 15 mg by mouth daily. 11/21/13  Yes Historical Provider, MD  metFORMIN (GLUCOPHAGE) 1000 MG tablet Take 1,000 mg  by mouth 2 (two) times daily. 07/20/12  Yes Rhonda G Barrett, PA-C  metoprolol tartrate (LOPRESSOR) 25 MG tablet Take 25 mg by mouth 2 (two) times daily.   Yes Historical Provider, MD  nitroGLYCERIN (NITROSTAT) 0.4 MG SL tablet Place 0.4 mg under the tongue every 5 (five) minutes as needed.   Yes Historical Provider, MD  omeprazole (PRILOSEC) 20 MG capsule Take 20 mg by mouth 2 (two) times daily.   Yes Historical Provider, MD  ramipril (ALTACE) 5 MG tablet Take 5 mg by mouth daily.   Yes Historical Provider, MD   BP 132/82  Pulse 88  Temp(Src) 98 F (36.7 C) (Oral)  Resp 16  Ht 5' 9"  (1.753 m)  Wt 230 lb (104.327 kg)  BMI 33.95 kg/m2  SpO2 99% Physical Exam  Nursing note and vitals reviewed. Constitutional: He is oriented to person, place, and time. He appears well-developed and well-nourished.  HENT:  Head: Normocephalic and atraumatic.  Right Ear: External ear normal.  Left Ear: External ear normal.  Eyes: Conjunctivae and EOM are normal. Pupils are equal, round, and reactive to light.  Neck: Normal range of motion and phonation normal. Neck supple.  Cardiovascular: Normal rate, regular rhythm, normal heart sounds and intact distal pulses.   Pulmonary/Chest: Effort normal and breath sounds normal. He exhibits no bony tenderness.  Abdominal: Soft. There is tenderness.  Mild tenderness around the umbilicus.   Musculoskeletal: Normal range of motion.  Neurological: He is alert and oriented to person, place, and time. No cranial nerve deficit or sensory deficit. He exhibits normal muscle tone. Coordination normal.  Skin: Skin is warm, dry and intact.  Psychiatric: He has a normal mood and affect. His behavior is normal. Judgment and thought content normal.    ED Course  Procedures (including critical care time) DIAGNOSTIC STUDIES: Oxygen Saturation is 100% on room air, normal by my interpretation.    COORDINATION OF CARE: 10:24 PM-Discussed treatment plan with pt at bedside and  pt agreed to plan.   Medications  ondansetron (ZOFRAN) injection 4 mg (4 mg Intravenous Given 04/14/14 2153)   Patient Vitals for the past 24 hrs:  BP Temp Temp src Pulse Resp SpO2 Height Weight  04/14/14 2300 147/86 mmHg - - 88 20 100 % - -  04/14/14 2245 - - - 84 15 100 % - -  04/14/14 2230 146/93 mmHg - - - 16 - - -  04/14/14 2215 - - - 89 13 100 % - -  04/14/14 2200 134/76 mmHg - - 97 18 97 % - -  04/14/14 2130 132/82 mmHg - - 88 16 99 % - -  04/14/14 2100 137/83 mmHg - - 88 15 98 % - -  04/14/14 2030 137/87 mmHg - - 95 19 98 % - -  04/14/14 2017 131/83 mmHg 98 F (36.7 C) Oral 96 16 99 % - -  04/14/14 2005 140/81 mmHg 98.1 F (36.7 C) Oral 99 24 100 % 5' 9"  (1.753 m) 230 lb (104.327 kg)   Results for orders placed during the hospital encounter of 04/14/14  CBC      Result Value Ref Range   WBC 3.2 (*) 4.0 - 10.5 K/uL   RBC 4.64  4.22 - 5.81 MIL/uL   Hemoglobin 14.7  13.0 - 17.0 g/dL   HCT 40.6  39.0 - 52.0 %   MCV 87.5  78.0 - 100.0 fL   MCH 31.7  26.0 - 34.0 pg   MCHC 36.2 (*) 30.0 - 36.0 g/dL   RDW 13.0  11.5 - 15.5 %   Platelets 119 (*) 150 - 400 K/uL  BASIC METABOLIC PANEL      Result Value Ref Range   Sodium 134 (*) 137 - 147 mEq/L   Potassium 4.7  3.7 - 5.3 mEq/L   Chloride 96  96 - 112 mEq/L   CO2 25  19 - 32 mEq/L   Glucose, Bld 390 (*) 70 - 99 mg/dL   BUN 19  6 - 23 mg/dL   Creatinine, Ser 0.97  0.50 - 1.35 mg/dL   Calcium 9.4  8.4 - 10.5 mg/dL   GFR calc non Af Amer >90  >90 mL/min   GFR calc Af Amer >90  >90 mL/min  TROPONIN I      Result Value Ref Range   Troponin I <0.30  <0.30 ng/mL   Dg Chest Port 1 View  04/14/2014   CLINICAL DATA:  Left chest and arm pain.  Nausea.  Diaphoresis.  EXAM: PORTABLE CHEST - 1 VIEW  COMPARISON:  12/03/2013  FINDINGS: The heart size and mediastinal contours are within normal limits. Both lungs are clear. The visualized skeletal structures are unremarkable.  IMPRESSION: No active disease.   Electronically Signed   By: Earle Gell M.D.   On: 04/14/2014 20:31    EKG Interpretation   Date/Time:  Saturday April 14 2014 20:12:17 EDT Ventricular Rate:  94 PR Interval:  127 QRS Duration: 85 QT Interval:  343 QTC Calculation: 429 R Axis:   4 Text Interpretation:  Sinus rhythm Borderline low voltage, extremity leads  Since last tracing rate slower Confirmed by KNAPP  MD-I, IVA ([id number]) on  04/14/2014 8:22:05 PM      MDM   Final diagnoses:  Chest pain    Nonspecific chest pain, with recent reassuring. Your cardiac stress test. His chest pain. Today, is very atypical for an acute coronary event, and is not indicative of angina. I doubt ACS, PE, or pneumonia  Nursing Notes Reviewed/ Care Coordinated Applicable Imaging Reviewed Interpretation of Laboratory Data incorporated into ED treatment  The patient appears reasonably screened and/or stabilized for discharge and I doubt any other medical condition or other [facility] requiring further screening, evaluation, or treatment in the ED at this time prior to discharge.  Plan: Home Medications- usual; Home Treatments- rest; return here if the recommended treatment, does not improve the symptoms; Recommended follow up- Cardiology for check up in 3-4 days  I personally performed the services described in this documentation, which was scribed in my presence. The recorded information has been reviewed and is accurate.     Richarda Blade, MD 04/14/14 [phone]

## 2014-04-14 NOTE — ED Notes (Signed)
EDP at bedside  

## 2014-04-14 NOTE — ED Notes (Signed)
Pt. C/o nausea. See [facility]

## 2014-04-14 NOTE — Discharge Instructions (Signed)
Chest Pain (Nonspecific) °It is often hard to give a specific diagnosis for the cause of chest pain. There is always a chance that your pain could be related to something serious, such as a heart attack or a blood clot in the lungs. You need to follow up with your caregiver for further evaluation. °CAUSES  °· Heartburn. °· Pneumonia or bronchitis. °· Anxiety or stress. °· Inflammation around your heart (pericarditis) or lung (pleuritis or pleurisy). °· A blood clot in the lung. °· A collapsed lung (pneumothorax). It can develop suddenly on its own (spontaneous pneumothorax) or from injury (trauma) to the chest. °· Shingles infection (herpes zoster virus). °The chest wall is composed of bones, muscles, and cartilage. Any of these can be the source of the pain. °· The bones can be bruised by injury. °· The muscles or cartilage can be strained by coughing or overwork. °· The cartilage can be affected by inflammation and become sore (costochondritis). °DIAGNOSIS  °Lab tests or other studies, such as X-rays, electrocardiography, stress testing, or cardiac imaging, may be needed to find the cause of your pain.  °TREATMENT  °· Treatment depends on what may be causing your chest pain. Treatment may include: °· Acid blockers for heartburn. °· Anti-inflammatory medicine. °· Pain medicine for inflammatory conditions. °· Antibiotics if an infection is present. °· You may be advised to change lifestyle habits. This includes stopping smoking and avoiding alcohol, caffeine, and chocolate. °· You may be advised to keep your head raised (elevated) when sleeping. This reduces the chance of acid going backward from your stomach into your esophagus. °· Most of the time, nonspecific chest pain will improve within 2 to 3 days with rest and mild pain medicine. °HOME CARE INSTRUCTIONS  °· If antibiotics were prescribed, take your antibiotics as directed. Finish them even if you start to feel better. °· For the next few days, avoid physical  activities that bring on chest pain. Continue physical activities as directed. °· Do not smoke. °· Avoid drinking alcohol. °· Only take over-the-counter or prescription medicine for pain, discomfort, or fever as directed by your caregiver. °· Follow your caregiver's suggestions for further testing if your chest pain does not go away. °· Keep any follow-up appointments you made. If you do not go to an appointment, you could develop lasting (chronic) problems with pain. If there is any problem keeping an appointment, you must call to reschedule. °SEEK MEDICAL CARE IF:  °· You think you are having problems from the medicine you are taking. Read your medicine instructions carefully. °· Your chest pain does not go away, even after treatment. °· You develop a rash with blisters on your chest. °SEEK IMMEDIATE MEDICAL CARE IF:  °· You have increased chest pain or pain that spreads to your arm, neck, jaw, back, or abdomen. °· You develop shortness of breath, an increasing cough, or you are coughing up blood. °· You have severe back or abdominal pain, feel nauseous, or vomit. °· You develop severe weakness, fainting, or chills. °· You have a fever. °THIS IS AN EMERGENCY. Do not wait to see if the pain will go away. Get medical help at once. Call your local emergency services (911 in [place] Do not drive yourself to the hospital. °MAKE SURE YOU:  °· Understand these instructions. °· Will watch your condition. °· Will get help right away if you are not doing well or get worse. °Document Released: 08/05/2005 Document Revised: 01/18/2012 Document Reviewed: 05/31/2008 °ExitCare® Patient Information ©2014 [employer]   [employer]. ° °

## 2014-04-14 NOTE — ED Notes (Addendum)
Pt. Reports left chest pain starting at 1830 that radiates to left shoulder and back. Pt. C/o sharp intermitted pain 5/10. Pt. Reports taking 1 Nitro at home with some relief. Pt. Reports taking 1 Asprin at home.

## 2014-04-14 NOTE — ED Notes (Signed)
Pt reporting chest pain in mid to left chest that began about 3 days ago. Reports pain got worse today. Reports pain radiated down arm about 1 hour ago.  Reporting some nausea, diaphoresis.  Reports taking 1 SL nitro and 325 mg ASA prior to arrival.

## 2014-10-18 ENCOUNTER — Encounter (HOSPITAL_COMMUNITY): Payer: Self-pay | Admitting: Cardiovascular Disease

## 2014-11-16 ENCOUNTER — Encounter: Payer: Self-pay | Admitting: Cardiology

## 2014-11-16 ENCOUNTER — Other Ambulatory Visit: Payer: Self-pay | Admitting: Cardiology

## 2014-11-16 ENCOUNTER — Telehealth: Payer: Self-pay | Admitting: Cardiology

## 2014-11-16 ENCOUNTER — Encounter: Payer: Self-pay | Admitting: *Deleted

## 2014-11-16 ENCOUNTER — Ambulatory Visit (INDEPENDENT_AMBULATORY_CARE_PROVIDER_SITE_OTHER): Payer: Medicare Other | Admitting: Cardiology

## 2014-11-16 DIAGNOSIS — I10 Essential (primary) hypertension: Secondary | ICD-10-CM

## 2014-11-16 DIAGNOSIS — I200 Unstable angina: Secondary | ICD-10-CM

## 2014-11-16 MED ORDER — AMLODIPINE BESYLATE 2.5 MG PO TABS: 2.5000 mg | ORAL_TABLET | Freq: Every day | ORAL | Status: DC

## 2014-11-16 NOTE — Assessment & Plan Note (Signed)
History of statin intolerance, currently on TriCor.

## 2014-11-16 NOTE — Telephone Encounter (Signed)
Pt has Medicare and Medicaid.  No precert required.

## 2014-11-16 NOTE — Patient Instructions (Signed)
Your physician has recommended you make the following change in your medication:  Start amlodipine 2.5 mg daily. Continue all other medications the same. Your physician has requested that you have a cardiac catheterization. Cardiac catheterization is used to diagnose and/or treat various heart conditions. Doctors may recommend this procedure for a number of different reasons. The most common reason is to evaluate chest pain. Chest pain can be a symptom of coronary artery disease (CAD), and cardiac catheterization can show whether plaque is narrowing or blocking your heart's arteries. This procedure is also used to evaluate the valves, as well as measure the blood flow and oxygen levels in different parts of your heart. For further information please visit [email or web address]. Please follow instruction sheet, as given.

## 2014-11-16 NOTE — Telephone Encounter (Signed)
Left heart cath WK:[id number] angina at [facility] Tuesday 11/20/14 @12 :00 noon. Checking percert

## 2014-11-16 NOTE — Progress Notes (Signed)
Reason for visit: Chest pain, CAD  Clinical Summary [name] [name] is a 59 y.o.male last seen by me in February 2014. Interval inpatient consultation with Dr. Harl Bowie noted in January 2015 at which time the patient had a low risk Lexiscan Cardiolite with no evidence of ischemia and LVEF 54%. Echocardiogram also done at that time showed mild LVH with LVEF 75-88%, grade 1 diastolic dysfunction, calcified aortic annulus, and mild tricuspid regurgitation.  He called the office today reporting recurrent chest pain symptoms and was added on to the schedule. He is here with his wife, appears comfortable. He states that over the last week he has been having recurrent chest pains and pressure sensation. This has been both sporadic and also with activity. He has not taken nitroglycerin. He otherwise reports compliance with his medications including aspirin, Lopressor, Altace and TriCor. He thought that there might be a component of asthma and tried MDIs, although without benefit. ECG in the office today is normal. He states that the symptoms are worse than they have been in the past, and more frequent.  Prior records reviewed. Cardiac catheterization from September 2013 showed 40-50% in-stent restenosis within the proximal LAD, 50-60% first obtuse marginal, and otherwise mild atherosclerosis with normal LVEF.   Lipid panel from July 2015 showed cholesterol 217, HDL 36, LDL 132, and triglycerides 247. He has had statin intolerance.  No Known Allergies  Current Outpatient Prescriptions  Medication Sig Dispense Refill  . aspirin EC 81 MG tablet Take 81 mg by mouth daily.    . citalopram (CELEXA) 20 MG tablet Take 20 mg by mouth daily.    . fenofibrate (TRICOR) 145 MG tablet Take 145 mg by mouth daily.    . fluticasone (FLONASE) 50 MCG/ACT nasal spray Place 2 sprays into the nose daily.    . Fluticasone-Salmeterol (ADVAIR) 250-50 MCG/DOSE AEPB Inhale 1 puff into the lungs every 12 (twelve) hours.    .  gabapentin (NEURONTIN) 600 MG tablet Take 1 tablet (600 mg total) by mouth 3 (three) times daily. 90 tablet 3  . insulin aspart (NOVOLOG) 100 UNIT/ML injection Inject 2-30 Units into the skin 3 (three) times daily before meals.     . Insulin Glargine (LANTUS SOLOSTAR) 100 UNIT/ML Solostar Pen Inject 60 Units into the skin 2 (two) times daily.    . Melatonin 5 MG TABS Take 10 mg by mouth at bedtime as needed (Sleep).     . metFORMIN (GLUCOPHAGE) 1000 MG tablet Take 1,000 mg by mouth 2 (two) times daily.    . metoprolol tartrate (LOPRESSOR) 25 MG tablet Take 25 mg by mouth 2 (two) times daily.    . nitroGLYCERIN (NITROSTAT) 0.4 MG SL tablet Place 0.4 mg under the tongue every 5 (five) minutes as needed.    [name] omeprazole (PRILOSEC) 20 MG capsule Take 20 mg by mouth 2 (two) times daily.    . ramipril (ALTACE) 5 MG tablet Take 5 mg by mouth daily.     No current facility-administered medications for this visit.    Past Medical History  Diagnosis Date  . Coronary atherosclerosis of native coronary artery     a. h/o LAD & OM stenting;  b. 2007 Cath: nonobs dzs, patent stents;  c. 07/2012 neg MV, EF 61%.  . Type 2 diabetes mellitus   . Asthma   . Essential hypertension, benign   . TIA (transient ischemic attack)   . Anxiety   . Hypercholesteremia   . Acid reflux     Past  Surgical History  Procedure Laterality Date  . Neck surgery    . Carpal tunnel release    . Shoulder arthroscopy    . Knee surgery  4/13  . Elbow surgery    . Coronary angioplasty with stent placement    . Left heart catheterization with coronary angiogram N/A 07/20/2012    Procedure: LEFT HEART CATHETERIZATION WITH CORONARY ANGIOGRAM;  Surgeon: [name], MD;  Location: [facility];  Service: Cardiovascular;  Laterality: N/A;    History reviewed. No pertinent family history.  Social History [name] [name] reports that he has never smoked. He has never used smokeless tobacco. [name] [name] reports that he does not  drink alcohol.  Review of Systems Complete review of systems negative except as otherwise outlined in the clinical summary and also the following. No palpitations or syncope. Recent headache. Mild orthopnea. No PND. No syncope, but some dizziness.  Physical Examination Filed Vitals:   11/16/14 1325  BP: 158/102  Pulse: 91   Filed Weights   11/16/14 1325  Weight: 231 lb (104.781 kg)    Obese male, no acute distress. HEENT: Conjunctiva and lids normal, oropharynx clear. Neck: Supple, no elevated JVP or carotid bruits, no thyromegaly. Lungs: Clear to auscultation, nonlabored breathing at rest. No wheezing. Cardiac: Regular rate and rhythm, no S3 or significant systolic murmur, no pericardial rub. Abdomen: Soft, nontender, protuberant, bowel sounds present. Extremities: No pitting edema, distal pulses 2+. Skin: Warm and dry. Muscular skeletal: No kyphosis. Neuropsychiatric: Alert and oriented 3.   Problem List and Plan   Accelerating angina Symptoms noted over the last week. He is currently pain-free and ECG is normal. Blood pressure not controlled. He reports compliance with his medications. We have discussed proceeding with a diagnostic heart catheterization early next week for reevaluation of coronary anatomy and reassessment of potential revascularization options. Norvasc 2.5 mg being added for blood pressure and antianginal control. After reviewing the risks and benefits, patient is in agreement to proceed. If his symptoms worsen over the weekend, I have recommended hospital evaluation.  Essential hypertension, benign Blood pressure elevated today. Continue present regimen, Norvasc being added. Could also be a function of worsening ischemic heart disease.  Mixed hyperlipidemia History of statin intolerance, currently on TriCor.    [name], M.D., F.A.C.C.

## 2014-11-16 NOTE — Telephone Encounter (Signed)
No precert required 

## 2014-11-16 NOTE — Assessment & Plan Note (Signed)
Blood pressure elevated today. Continue present regimen, Norvasc being added. Could also be a function of worsening ischemic heart disease.

## 2014-11-16 NOTE — Assessment & Plan Note (Signed)
Symptoms noted over the last week. He is currently pain-free and ECG is normal. Blood pressure not controlled. He reports compliance with his medications. We have discussed proceeding with a diagnostic heart catheterization early next week for reevaluation of coronary anatomy and reassessment of potential revascularization options. Norvasc 2.5 mg being added for blood pressure and antianginal control. After reviewing the risks and benefits, patient is in agreement to proceed. If his symptoms worsen over the weekend, I have recommended hospital evaluation.

## 2014-11-20 ENCOUNTER — Encounter (HOSPITAL_COMMUNITY)
Admission: RE | Disposition: A | Discharge status: Home / Self Care | Payer: Medicare Other | Source: Ambulatory Visit | Attending: Cardiology

## 2014-11-20 ENCOUNTER — Ambulatory Visit (HOSPITAL_BASED_OUTPATIENT_CLINIC_OR_DEPARTMENT_OTHER)
Admission: RE | Admit: 2014-11-20 | Discharge: 2014-11-21 | Disposition: A | Discharge status: Home / Self Care | Payer: Medicare Other | Source: Ambulatory Visit | Attending: Cardiology | Admitting: Cardiology

## 2014-11-20 ENCOUNTER — Encounter (HOSPITAL_COMMUNITY): Payer: Self-pay | Admitting: General Practice

## 2014-11-20 DIAGNOSIS — Z955 Presence of coronary angioplasty implant and graft: Secondary | ICD-10-CM

## 2014-11-20 DIAGNOSIS — E782 Mixed hyperlipidemia: Secondary | ICD-10-CM | POA: Diagnosis present

## 2014-11-20 DIAGNOSIS — Z8673 Personal history of transient ischemic attack (TIA), and cerebral infarction without residual deficits: Secondary | ICD-10-CM

## 2014-11-20 DIAGNOSIS — Z794 Long term (current) use of insulin: Secondary | ICD-10-CM | POA: Insufficient documentation

## 2014-11-20 DIAGNOSIS — T82897A Other specified complication of cardiac prosthetic devices, implants and grafts, initial encounter: Secondary | ICD-10-CM | POA: Insufficient documentation

## 2014-11-20 DIAGNOSIS — Y838 Other surgical procedures as the cause of abnormal reaction of the patient, or of later complication, without mention of misadventure at the time of the procedure: Secondary | ICD-10-CM

## 2014-11-20 DIAGNOSIS — K219 Gastro-esophageal reflux disease without esophagitis: Secondary | ICD-10-CM | POA: Insufficient documentation

## 2014-11-20 DIAGNOSIS — I493 Ventricular premature depolarization: Secondary | ICD-10-CM | POA: Diagnosis present

## 2014-11-20 DIAGNOSIS — F419 Anxiety disorder, unspecified: Secondary | ICD-10-CM | POA: Insufficient documentation

## 2014-11-20 DIAGNOSIS — Z7982 Long term (current) use of aspirin: Secondary | ICD-10-CM

## 2014-11-20 DIAGNOSIS — I10 Essential (primary) hypertension: Secondary | ICD-10-CM | POA: Insufficient documentation

## 2014-11-20 DIAGNOSIS — E119 Type 2 diabetes mellitus without complications: Secondary | ICD-10-CM | POA: Insufficient documentation

## 2014-11-20 DIAGNOSIS — I2510 Atherosclerotic heart disease of native coronary artery without angina pectoris: Secondary | ICD-10-CM | POA: Diagnosis present

## 2014-11-20 DIAGNOSIS — Y92238 Other place in hospital as the place of occurrence of the external cause: Secondary | ICD-10-CM

## 2014-11-20 DIAGNOSIS — G4733 Obstructive sleep apnea (adult) (pediatric): Secondary | ICD-10-CM | POA: Diagnosis present

## 2014-11-20 DIAGNOSIS — E1169 Type 2 diabetes mellitus with other specified complication: Secondary | ICD-10-CM

## 2014-11-20 DIAGNOSIS — E1165 Type 2 diabetes mellitus with hyperglycemia: Secondary | ICD-10-CM

## 2014-11-20 DIAGNOSIS — I25118 Atherosclerotic heart disease of native coronary artery with other forms of angina pectoris: Secondary | ICD-10-CM

## 2014-11-20 DIAGNOSIS — I200 Unstable angina: Secondary | ICD-10-CM

## 2014-11-20 DIAGNOSIS — I25110 Atherosclerotic heart disease of native coronary artery with unstable angina pectoris: Secondary | ICD-10-CM | POA: Insufficient documentation

## 2014-11-20 DIAGNOSIS — J45909 Unspecified asthma, uncomplicated: Secondary | ICD-10-CM | POA: Insufficient documentation

## 2014-11-20 DIAGNOSIS — I071 Rheumatic tricuspid insufficiency: Secondary | ICD-10-CM | POA: Insufficient documentation

## 2014-11-20 DIAGNOSIS — D696 Thrombocytopenia, unspecified: Secondary | ICD-10-CM | POA: Diagnosis present

## 2014-11-20 DIAGNOSIS — R079 Chest pain, unspecified: Secondary | ICD-10-CM | POA: Diagnosis not present

## 2014-11-20 DIAGNOSIS — I214 Non-ST elevation (NSTEMI) myocardial infarction: Secondary | ICD-10-CM | POA: Diagnosis not present

## 2014-11-20 DIAGNOSIS — G459 Transient cerebral ischemic attack, unspecified: Secondary | ICD-10-CM | POA: Diagnosis present

## 2014-11-20 HISTORY — DX: Dependence on other enabling machines and devices: Z99.89

## 2014-11-20 HISTORY — DX: Low back pain, unspecified: M54.50

## 2014-11-20 HISTORY — PX: PERCUTANEOUS CORONARY ROTOBLATOR INTERVENTION (PCI-R): SHX5484

## 2014-11-20 HISTORY — DX: Thrombocytopenia, unspecified: D69.6

## 2014-11-20 HISTORY — DX: Migraine, unspecified, not intractable, without status migrainosus: G43.909

## 2014-11-20 HISTORY — DX: Gastro-esophageal reflux disease without esophagitis: K21.9

## 2014-11-20 HISTORY — DX: Low back pain: M54.5

## 2014-11-20 HISTORY — DX: Other chronic pain: G89.29

## 2014-11-20 HISTORY — DX: Other intervertebral disc displacement, lumbar region: M51.26

## 2014-11-20 HISTORY — DX: Obstructive sleep apnea (adult) (pediatric): G47.33

## 2014-11-20 HISTORY — DX: Personal history of other diseases of the musculoskeletal system and connective tissue: Z87.39

## 2014-11-20 HISTORY — PX: LEFT HEART CATHETERIZATION WITH CORONARY ANGIOGRAM: SHX5451

## 2014-11-20 HISTORY — DX: Personal history of other diseases of the digestive system: Z87.19

## 2014-11-20 HISTORY — DX: Unspecified osteoarthritis, unspecified site: M19.90

## 2014-11-20 LAB — CBC
HEMATOCRIT: 40.8 % (ref 39.0–52.0)
Hemoglobin: 14.7 g/dL (ref 13.0–17.0)
MCH: 32 pg (ref 26.0–34.0)
MCHC: 36 g/dL (ref 30.0–36.0)
MCV: 88.9 fL (ref 78.0–100.0)
Platelets: 105 10*3/uL — ABNORMAL LOW (ref 150–400)
RBC: 4.59 MIL/uL (ref 4.22–5.81)
RDW: 12.8 % (ref 11.5–15.5)
WBC: 2.9 10*3/uL — AB (ref 4.0–10.5)

## 2014-11-20 LAB — GLUCOSE, CAPILLARY
GLUCOSE-CAPILLARY: 155 mg/dL — AB (ref 70–99)
GLUCOSE-CAPILLARY: 142 mg/dL — AB (ref 70–99)
GLUCOSE-CAPILLARY: 206 mg/dL — AB (ref 70–99)
Glucose-Capillary: 254 mg/dL — ABNORMAL HIGH (ref 70–99)

## 2014-11-20 LAB — BASIC METABOLIC PANEL
ANION GAP: 8 (ref 5–15)
BUN: 11 mg/dL (ref 6–23)
CALCIUM: 9.2 mg/dL (ref 8.4–10.5)
CHLORIDE: 105 meq/L (ref 96–112)
CO2: 26 mmol/L (ref 19–32)
Creatinine, Ser: 0.89 mg/dL (ref 0.50–1.35)
GFR calc Af Amer: >90 mL/min (ref >90)
GFR calc non Af Amer: >90 mL/min (ref >90)
GLUCOSE: 163 mg/dL — AB (ref 70–99)
POTASSIUM: 3.9 mmol/L (ref 3.5–5.1)
SODIUM: 139 mmol/L (ref 135–145)

## 2014-11-20 LAB — PROTIME-INR
INR: 1.03 (ref 0.00–1.49)
Prothrombin Time: 13.6 seconds (ref 11.6–15.2)

## 2014-11-20 LAB — POCT ACTIVATED CLOTTING TIME: ACTIVATED CLOTTING TIME: 608 s

## 2014-11-20 SURGERY — LEFT HEART CATHETERIZATION WITH CORONARY ANGIOGRAM
Anesthesia: LOCAL

## 2014-11-20 MED ORDER — HEPARIN SODIUM (PORCINE) 1000 UNIT/ML IJ SOLN
INTRAMUSCULAR | Status: AC
  Filled 2014-11-20: qty 1

## 2014-11-20 MED ORDER — NITROGLYCERIN 1 MG/10 ML FOR IR/CATH LAB
INTRA_ARTERIAL | Status: AC
  Filled 2014-11-20: qty 10

## 2014-11-20 MED ORDER — INSULIN GLARGINE 100 UNIT/ML ~~LOC~~ SOLN
60.0000 [IU] | Freq: Two times a day (BID) | SUBCUTANEOUS | Status: DC
  Administered 2014-11-20 – 2014-11-21 (×2): 60 [IU] via SUBCUTANEOUS
  Filled 2014-11-20 (×3): qty 0.6

## 2014-11-20 MED ORDER — SODIUM CHLORIDE 0.9 % IV SOLN
1.0000 mL/kg/h | INTRAVENOUS | Status: AC
Start: 2014-11-20 — End: 2014-11-20

## 2014-11-20 MED ORDER — GABAPENTIN 600 MG PO TABS
600.0000 mg | ORAL_TABLET | Freq: Three times a day (TID) | ORAL | Status: DC
  Administered 2014-11-20 – 2014-11-21 (×3): 600 mg via ORAL
  Filled 2014-11-20 (×7): qty 1

## 2014-11-20 MED ORDER — RAMIPRIL 5 MG PO CAPS
5.0000 mg | ORAL_CAPSULE | Freq: Every day | ORAL | Status: DC
  Administered 2014-11-20 – 2014-11-21 (×2): 5 mg via ORAL
  Filled 2014-11-20 (×3): qty 1

## 2014-11-20 MED ORDER — MIDAZOLAM HCL 2 MG/2ML IJ SOLN
INTRAMUSCULAR | Status: AC
  Filled 2014-11-20: qty 2

## 2014-11-20 MED ORDER — NITROGLYCERIN 0.4 MG SL SUBL: 0.4000 mg | SUBLINGUAL_TABLET | SUBLINGUAL | Status: DC | PRN

## 2014-11-20 MED ORDER — FENTANYL CITRATE 0.05 MG/ML IJ SOLN
INTRAMUSCULAR | Status: AC
  Filled 2014-11-20: qty 2

## 2014-11-20 MED ORDER — CITALOPRAM HYDROBROMIDE 20 MG PO TABS
20.0000 mg | ORAL_TABLET | Freq: Every day | ORAL | Status: DC
  Administered 2014-11-20 – 2014-11-21 (×2): 20 mg via ORAL
  Filled 2014-11-20 (×3): qty 1

## 2014-11-20 MED ORDER — ASPIRIN 81 MG PO CHEW: 81.0000 mg | CHEWABLE_TABLET | ORAL | Status: DC

## 2014-11-20 MED ORDER — PANTOPRAZOLE SODIUM 40 MG PO TBEC
40.0000 mg | DELAYED_RELEASE_TABLET | Freq: Every day | ORAL | Status: DC
  Administered 2014-11-20 – 2014-11-21 (×2): 40 mg via ORAL
  Filled 2014-11-20 (×2): qty 1

## 2014-11-20 MED ORDER — ACETAMINOPHEN 325 MG PO TABS: 650.0000 mg | ORAL_TABLET | ORAL | Status: DC | PRN

## 2014-11-20 MED ORDER — SODIUM CHLORIDE 0.9 % IV SOLN: 250.0000 mL | INTRAVENOUS | Status: DC | PRN

## 2014-11-20 MED ORDER — SODIUM CHLORIDE 0.9 % IV SOLN
1.7500 mg/kg/h | INTRAVENOUS | Status: AC
  Administered 2014-11-20: 1.75 mg/kg/h via INTRAVENOUS
  Filled 2014-11-20: qty 250

## 2014-11-20 MED ORDER — SODIUM CHLORIDE 0.9 % IJ SOLN: 3.0000 mL | Freq: Two times a day (BID) | INTRAMUSCULAR | Status: DC

## 2014-11-20 MED ORDER — FLUTICASONE PROPIONATE 50 MCG/ACT NA SUSP
2.0000 | Freq: Every day | NASAL | Status: DC
  Administered 2014-11-21: 2 via NASAL
  Filled 2014-11-20: qty 16

## 2014-11-20 MED ORDER — AMLODIPINE BESYLATE 2.5 MG PO TABS
2.5000 mg | ORAL_TABLET | Freq: Every day | ORAL | Status: DC
  Administered 2014-11-20 – 2014-11-21 (×2): 2.5 mg via ORAL
  Filled 2014-11-20 (×3): qty 1

## 2014-11-20 MED ORDER — SODIUM CHLORIDE 0.9 % IJ SOLN: 3.0000 mL | INTRAMUSCULAR | Status: DC | PRN

## 2014-11-20 MED ORDER — CLOPIDOGREL BISULFATE 300 MG PO TABS
ORAL_TABLET | ORAL | Status: AC
  Filled 2014-11-20: qty 1

## 2014-11-20 MED ORDER — MOMETASONE FURO-FORMOTEROL FUM 100-5 MCG/ACT IN AERO
2.0000 | INHALATION_SPRAY | Freq: Two times a day (BID) | RESPIRATORY_TRACT | Status: DC
  Administered 2014-11-20 – 2014-11-21 (×2): 2 via RESPIRATORY_TRACT
  Filled 2014-11-20 (×2): qty 8.8

## 2014-11-20 MED ORDER — CLOPIDOGREL BISULFATE 75 MG PO TABS
75.0000 mg | ORAL_TABLET | Freq: Every day | ORAL | Status: DC
  Administered 2014-11-21: 08:00:00 75 mg via ORAL
  Filled 2014-11-20: qty 1

## 2014-11-20 MED ORDER — BIVALIRUDIN 250 MG IV SOLR
INTRAVENOUS | Status: AC
  Filled 2014-11-20: qty 250

## 2014-11-20 MED ORDER — SODIUM CHLORIDE 0.9 % IV SOLN
INTRAVENOUS | Status: DC
  Administered 2014-11-20: 11:00:00 via INTRAVENOUS

## 2014-11-20 MED ORDER — ASPIRIN 81 MG PO CHEW
81.0000 mg | CHEWABLE_TABLET | Freq: Every day | ORAL | Status: DC
  Administered 2014-11-21: 81 mg via ORAL
  Filled 2014-11-20: qty 1

## 2014-11-20 MED ORDER — ONDANSETRON HCL 4 MG/2ML IJ SOLN: 4.0000 mg | Freq: Four times a day (QID) | INTRAMUSCULAR | Status: DC | PRN

## 2014-11-20 MED ORDER — INSULIN ASPART 100 UNIT/ML ~~LOC~~ SOLN
0.0000 [IU] | Freq: Three times a day (TID) | SUBCUTANEOUS | Status: DC
  Administered 2014-11-20: 18:00:00 8 [IU] via SUBCUTANEOUS
  Administered 2014-11-21: 3 [IU] via SUBCUTANEOUS

## 2014-11-20 MED ORDER — HEPARIN (PORCINE) IN NACL 2-0.9 UNIT/ML-% IJ SOLN
INTRAMUSCULAR | Status: AC
  Filled 2014-11-20: qty 1000

## 2014-11-20 MED ORDER — LIDOCAINE HCL (PF) 1 % IJ SOLN
INTRAMUSCULAR | Status: AC
  Filled 2014-11-20: qty 30

## 2014-11-20 MED ORDER — VERAPAMIL HCL 2.5 MG/ML IV SOLN
INTRAVENOUS | Status: AC
  Filled 2014-11-20: qty 2

## 2014-11-20 NOTE — Interval H&P Note (Signed)
History and Physical Interval Note:  11/20/2014 1:19 PM  [name] [name]  has presented today for surgery, with the diagnosis of c/p  The various methods of treatment have been discussed with the patient and family. After consideration of risks, benefits and other options for treatment, the patient has consented to  Procedure(s): LEFT HEART CATHETERIZATION WITH CORONARY ANGIOGRAM (N/A) as a surgical intervention .  The patient's history has been reviewed, patient examined, no change in status, stable for surgery.  I have reviewed the patient's chart and labs.  Questions were answered to the patient's satisfaction.    Cath Lab Visit (complete for each Cath Lab visit)  Clinical Evaluation Leading to the Procedure:   ACS: No  Non-ACS:    Anginal Classification: CCS IV  Anti-ischemic medical therapy: Minimal Therapy (1 class of medications)  Non-Invasive Test Results: No non-invasive testing performed  Prior CABG: No previous CABG       [name] 11/20/2014 1:20 PM

## 2014-11-20 NOTE — H&P (View-Only) (Signed)
Reason for visit: Chest pain, CAD  Clinical Summary [name] is a 59 y.o.male last seen by me in February 2014. Interval inpatient consultation with Dr. Harl Bowie noted in January 2015 at which time the patient had a low risk Lexiscan Cardiolite with no evidence of ischemia and LVEF 54%. Echocardiogram also done at that time showed mild LVH with LVEF 75-64%, grade 1 diastolic dysfunction, calcified aortic annulus, and mild tricuspid regurgitation.  He called the office today reporting recurrent chest pain symptoms and was added on to the schedule. He is here with his wife, appears comfortable. He states that over the last week he has been having recurrent chest pains and pressure sensation. This has been both sporadic and also with activity. He has not taken nitroglycerin. He otherwise reports compliance with his medications including aspirin, Lopressor, Altace and TriCor. He thought that there might be a component of asthma and tried MDIs, although without benefit. ECG in the office today is normal. He states that the symptoms are worse than they have been in the past, and more frequent.  Prior records reviewed. Cardiac catheterization from September 2013 showed 40-50% in-stent restenosis within the proximal LAD, 50-60% first obtuse marginal, and otherwise mild atherosclerosis with normal LVEF.   Lipid panel from July 2015 showed cholesterol 217, HDL 36, LDL 132, and triglycerides 247. He has had statin intolerance.  No Known Allergies  Current Outpatient Prescriptions  Medication Sig Dispense Refill  . aspirin EC 81 MG tablet Take 81 mg by mouth daily.    . citalopram (CELEXA) 20 MG tablet Take 20 mg by mouth daily.    . fenofibrate (TRICOR) 145 MG tablet Take 145 mg by mouth daily.    . fluticasone (FLONASE) 50 MCG/ACT nasal spray Place 2 sprays into the nose daily.    . Fluticasone-Salmeterol (ADVAIR) 250-50 MCG/DOSE AEPB Inhale 1 puff into the lungs every 12 (twelve) hours.    .  gabapentin (NEURONTIN) 600 MG tablet Take 1 tablet (600 mg total) by mouth 3 (three) times daily. 90 tablet 3  . insulin aspart (NOVOLOG) 100 UNIT/ML injection Inject 2-30 Units into the skin 3 (three) times daily before meals.     . Insulin Glargine (LANTUS SOLOSTAR) 100 UNIT/ML Solostar Pen Inject 60 Units into the skin 2 (two) times daily.    . Melatonin 5 MG TABS Take 10 mg by mouth at bedtime as needed (Sleep).     . metFORMIN (GLUCOPHAGE) 1000 MG tablet Take 1,000 mg by mouth 2 (two) times daily.    . metoprolol tartrate (LOPRESSOR) 25 MG tablet Take 25 mg by mouth 2 (two) times daily.    . nitroGLYCERIN (NITROSTAT) 0.4 MG SL tablet Place 0.4 mg under the tongue every 5 (five) minutes as needed.    [name] omeprazole (PRILOSEC) 20 MG capsule Take 20 mg by mouth 2 (two) times daily.    . ramipril (ALTACE) 5 MG tablet Take 5 mg by mouth daily.     No current facility-administered medications for this visit.    Past Medical History  Diagnosis Date  . Coronary atherosclerosis of native coronary artery     a. h/o LAD & OM stenting;  b. 2007 Cath: nonobs dzs, patent stents;  c. 07/2012 neg MV, EF 61%.  . Type 2 diabetes mellitus   . Asthma   . Essential hypertension, benign   . TIA (transient ischemic attack)   . Anxiety   . Hypercholesteremia   . Acid reflux     Past  Surgical History  Procedure Laterality Date  . Neck surgery    . Carpal tunnel release    . Shoulder arthroscopy    . Knee surgery  4/13  . Elbow surgery    . Coronary angioplasty with stent placement    . Left heart catheterization with coronary angiogram N/A 07/20/2012    Procedure: LEFT HEART CATHETERIZATION WITH CORONARY ANGIOGRAM;  Surgeon: [name], MD;  Location: [facility];  Service: Cardiovascular;  Laterality: N/A;    History reviewed. No pertinent family history.  Social History [name] reports that he has never smoked. He has never used smokeless tobacco. [name] reports that he does not  drink alcohol.  Review of Systems Complete review of systems negative except as otherwise outlined in the clinical summary and also the following. No palpitations or syncope. Recent headache. Mild orthopnea. No PND. No syncope, but some dizziness.  Physical Examination Filed Vitals:   11/16/14 1325  BP: 158/102  Pulse: 91   Filed Weights   11/16/14 1325  Weight: 231 lb (104.781 kg)    Obese male, no acute distress. HEENT: Conjunctiva and lids normal, oropharynx clear. Neck: Supple, no elevated JVP or carotid bruits, no thyromegaly. Lungs: Clear to auscultation, nonlabored breathing at rest. No wheezing. Cardiac: Regular rate and rhythm, no S3 or significant systolic murmur, no pericardial rub. Abdomen: Soft, nontender, protuberant, bowel sounds present. Extremities: No pitting edema, distal pulses 2+. Skin: Warm and dry. Muscular skeletal: No kyphosis. Neuropsychiatric: Alert and oriented 3.   Problem List and Plan   Accelerating angina Symptoms noted over the last week. He is currently pain-free and ECG is normal. Blood pressure not controlled. He reports compliance with his medications. We have discussed proceeding with a diagnostic heart catheterization early next week for reevaluation of coronary anatomy and reassessment of potential revascularization options. Norvasc 2.5 mg being added for blood pressure and antianginal control. After reviewing the risks and benefits, patient is in agreement to proceed. If his symptoms worsen over the weekend, I have recommended hospital evaluation.  Essential hypertension, benign Blood pressure elevated today. Continue present regimen, Norvasc being added. Could also be a function of worsening ischemic heart disease.  Mixed hyperlipidemia History of statin intolerance, currently on TriCor.    [name], M.D., F.A.C.C.

## 2014-11-20 NOTE — Progress Notes (Signed)
TR BAND REMOVAL  LOCATION:    right radial  DEFLATED PER PROTOCOL:    Yes.    TIME BAND OFF / DRESSING APPLIED:    1920   SITE UPON ARRIVAL:    Level 0  SITE AFTER BAND REMOVAL:    Level 0  REVERSE ALLEN'S TEST:     positive  CIRCULATION SENSATION AND MOVEMENT:    Within Normal Limits   Yes.    COMMENTS:   Tolerated procedure well

## 2014-11-20 NOTE — CV Procedure (Signed)
Cardiac Catheterization Procedure Note  Name: [name] [name] MRN: [id number] DOB: 04-25-1956  Procedure: Left Heart Cath, Selective Coronary Angiography, LV angiography, PTCA and stenting of the LAD and first OM  Indication: 59 yo WM with history of CAD s/p remote stenting of the proximal and mid LAD and PTCA of a branch of the first OM presents with accelerating angina.  Procedural Details:  The right wrist was prepped, draped, and anesthetized with 1% lidocaine. Using the modified Seldinger technique, a 6 French slender sheath was introduced into the right radial artery. 3 mg of verapamil was administered through the sheath, weight-based unfractionated heparin was administered intravenously. Standard Judkins catheters were used for selective coronary angiography and left ventriculography. Catheter exchanges were performed over an exchange length guidewire.  PROCEDURAL FINDINGS Hemodynamics: AO 133/74 mean 100 mm Hg LV 136/14 mm Hg   Coronary angiography: Coronary dominance: right  Left mainstem: Normal  Left anterior descending (LAD): The LAD has a long segment of stent in the proximal to mid vessel. Within the stent there is a focal 80-90% stenosis prior to the first diagonal. There is 50% stenosis at the distal stent margin.   Left circumflex (LCx): The LCx gives rise to a large first OM that bifurcates into a larger branch and a small side branch. There is a long 90% stenosis in the mid OM. The small side branch has 40-50% disease at the origin.   Right coronary artery (RCA): Diffuse calcification without obstructive disease.   Left ventriculography: Left ventricular systolic function is normal, LVEF is estimated at 55-65%, there is no significant mitral regurgitation   PCI Note:  Following the diagnostic procedure, the decision was made to proceed with PCI.  Weight-based bivalirudin was given for anticoagulation. Plavix 600 mg was given orally. Once a therapeutic ACT was  achieved, a 6 [place] XBLAD 3.5 guide catheter was inserted.  A prowater coronary guidewire was used to cross the lesion in the LAD.  The lesion was predilated with a 2.5 mm balloon. Because I was unable to deliver the stent the lesion was dilated again with a 3.0 mm noncompliant balloon.  The lesion was then stented with a 3.0 x 16 mm Promus  stent.  The stent was postdilated with a 3.0 mm noncompliant balloon.  Following PCI, there was 0% residual stenosis and TIMI-3 flow.  We next addressed the lesion in the first OM. The lesion was crossed with a prowater wire. The lesion was predilated with a 2.5 mm balloon. The lesion was then stented with a 2.75 x 24 mm Promus stent. The stent was postdilated with a 3.0 mm noncompliant balloon. Following PCI, there was 0% residual stenosis and TIMI grade 3 flow. Final angiography confirmed an excellent result. The patient tolerated the procedure well. There were no immediate procedural complications. A TR band was used for radial hemostasis. The patient was transferred to the post catheterization recovery area for further monitoring.  PCI Data: Vessel - LAD/Segment - Proximal Percent Stenosis (pre)  80-90% TIMI-flow 3 Stent 3.0 x 16 mm Promus Percent Stenosis (post) 0% TIMI-flow (post) 3  Vessel #2- first OM/ mid Percent stenosis (pre) 90% TIMI flow (pre) 3 Stent 2.75 x 24 mm Promus Percent stenosis (post) 0%  TIMI flow (post) 3  Final Conclusions:  1. 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 Normal LV function.  Recommendations:  Continue DAPT for one year. Anticipate DC in  am if stable.   Ellah Otte [name], [place] 11/20/2014, 2:53 PM

## 2014-11-21 ENCOUNTER — Encounter (HOSPITAL_COMMUNITY): Payer: Self-pay | Admitting: Physician Assistant

## 2014-11-21 DIAGNOSIS — Z8673 Personal history of transient ischemic attack (TIA), and cerebral infarction without residual deficits: Secondary | ICD-10-CM

## 2014-11-21 DIAGNOSIS — E119 Type 2 diabetes mellitus without complications: Secondary | ICD-10-CM | POA: Diagnosis present

## 2014-11-21 DIAGNOSIS — E669 Obesity, unspecified: Secondary | ICD-10-CM | POA: Diagnosis present

## 2014-11-21 DIAGNOSIS — M1990 Unspecified osteoarthritis, unspecified site: Secondary | ICD-10-CM | POA: Diagnosis present

## 2014-11-21 DIAGNOSIS — D696 Thrombocytopenia, unspecified: Secondary | ICD-10-CM | POA: Diagnosis present

## 2014-11-21 DIAGNOSIS — Z794 Long term (current) use of insulin: Secondary | ICD-10-CM

## 2014-11-21 DIAGNOSIS — I25110 Atherosclerotic heart disease of native coronary artery with unstable angina pectoris: Secondary | ICD-10-CM | POA: Diagnosis present

## 2014-11-21 DIAGNOSIS — E782 Mixed hyperlipidemia: Secondary | ICD-10-CM | POA: Diagnosis present

## 2014-11-21 DIAGNOSIS — Z7982 Long term (current) use of aspirin: Secondary | ICD-10-CM

## 2014-11-21 DIAGNOSIS — Z7902 Long term (current) use of antithrombotics/antiplatelets: Secondary | ICD-10-CM

## 2014-11-21 DIAGNOSIS — K219 Gastro-esophageal reflux disease without esophagitis: Secondary | ICD-10-CM | POA: Diagnosis present

## 2014-11-21 DIAGNOSIS — I214 Non-ST elevation (NSTEMI) myocardial infarction: Principal | ICD-10-CM | POA: Diagnosis present

## 2014-11-21 DIAGNOSIS — G459 Transient cerebral ischemic attack, unspecified: Secondary | ICD-10-CM | POA: Diagnosis present

## 2014-11-21 DIAGNOSIS — I10 Essential (primary) hypertension: Secondary | ICD-10-CM | POA: Diagnosis present

## 2014-11-21 DIAGNOSIS — G4733 Obstructive sleep apnea (adult) (pediatric): Secondary | ICD-10-CM | POA: Diagnosis present

## 2014-11-21 DIAGNOSIS — Z955 Presence of coronary angioplasty implant and graft: Secondary | ICD-10-CM

## 2014-11-21 DIAGNOSIS — I2510 Atherosclerotic heart disease of native coronary artery without angina pectoris: Secondary | ICD-10-CM | POA: Diagnosis present

## 2014-11-21 LAB — CBC
HCT: 38.8 % — ABNORMAL LOW (ref 39.0–52.0)
HEMOGLOBIN: 14.3 g/dL (ref 13.0–17.0)
MCH: 32.5 pg (ref 26.0–34.0)
MCHC: 36.9 g/dL — ABNORMAL HIGH (ref 30.0–36.0)
MCV: 88.2 fL (ref 78.0–100.0)
Platelets: 97 10*3/uL — ABNORMAL LOW (ref 150–400)
RBC: 4.4 MIL/uL (ref 4.22–5.81)
RDW: 12.6 % (ref 11.5–15.5)
WBC: 3.4 10*3/uL — ABNORMAL LOW (ref 4.0–10.5)

## 2014-11-21 LAB — BASIC METABOLIC PANEL
Anion gap: 9 (ref 5–15)
BUN: 12 mg/dL (ref 6–23)
CO2: 24 mmol/L (ref 19–32)
Calcium: 8.7 mg/dL (ref 8.4–10.5)
Chloride: 101 mEq/L (ref 96–112)
Creatinine, Ser: 0.9 mg/dL (ref 0.50–1.35)
GFR calc Af Amer: >90 mL/min (ref >90)
Glucose, Bld: 233 mg/dL — ABNORMAL HIGH (ref 70–99)
POTASSIUM: 3.7 mmol/L (ref 3.5–5.1)
SODIUM: 134 mmol/L — AB (ref 135–145)

## 2014-11-21 LAB — GLUCOSE, CAPILLARY: Glucose-Capillary: 162 mg/dL — ABNORMAL HIGH (ref 70–99)

## 2014-11-21 MED ORDER — METOPROLOL TARTRATE 25 MG PO TABS: 25.0000 mg | ORAL_TABLET | Freq: Two times a day (BID) | ORAL | Status: DC

## 2014-11-21 MED ORDER — METFORMIN HCL 1000 MG PO TABS: 1000.0000 mg | ORAL_TABLET | Freq: Two times a day (BID) | ORAL | Status: DC

## 2014-11-21 MED ORDER — CLOPIDOGREL BISULFATE 75 MG PO TABS: 75.0000 mg | ORAL_TABLET | Freq: Every day | ORAL | Status: DC

## 2014-11-21 MED ORDER — PANTOPRAZOLE SODIUM 40 MG PO TBEC: 40.0000 mg | DELAYED_RELEASE_TABLET | Freq: Every day | ORAL | Status: DC

## 2014-11-21 MED FILL — Sodium Chloride IV Soln 0.9%: INTRAVENOUS | Qty: 50 | Status: AC

## 2014-11-21 NOTE — Progress Notes (Signed)
CARDIAC REHAB PHASE I   PRE:  Rate/Rhythm: 88 SR  BP:  Supine:   Sitting: 173/90  Standing:    SaO2:   MODE:  Ambulation: 1000 ft   POST:  Rate/Rhythm: 94 SR  BP:  Supine:   Sitting: 171/90  Standing:    SaO2:  0930-1030 Pt tolerated ambulation well without c/o of cp or SOB. BP elevated before and after walk. Completed stent discharge education with pt and wife. They voice understanding. Pt agrees to [facility]. CRP in [place], will send referral.  Rodney Langton RN 11/21/2014 10:26 AM

## 2014-11-21 NOTE — Progress Notes (Signed)
Patient Name: [name] [name] Date of Encounter: 11/21/2014     Principal Problem:   Accelerating angina Active Problems:   CAD, NATIVE VESSEL   Unstable angina    SUBJECTIVE  No CP or SOB. Ready to go home  CURRENT MEDS . amLODipine  2.5 mg Oral Daily  . aspirin  81 mg Oral Daily  . citalopram  20 mg Oral Daily  . clopidogrel  75 mg Oral Q breakfast  . fluticasone  2 spray Each Nare Daily  . gabapentin  600 mg Oral TID  . insulin aspart  0-15 Units Subcutaneous TID WC  . insulin glargine  60 Units Subcutaneous BID  . mometasone-formoterol  2 puff Inhalation BID  . pantoprazole  40 mg Oral Daily  . ramipril  5 mg Oral Daily    OBJECTIVE  Filed Vitals:   11/20/14 1921 11/20/14 2000 11/20/14 2150 11/21/14 0008  BP:   145/79 158/83  Pulse:  87 87 85  Temp:   98.2 F (36.8 C) 98.3 F (36.8 C)  TempSrc:   Oral Oral  Resp:   18 18  Height:      Weight:    226 lb 6.6 oz (102.7 kg)  SpO2: 100% 99% 99% 99%    Intake/Output Summary (Last 24 hours) at 11/21/14 0621 Last data filed at 11/20/14 2152  Gross per 24 hour  Intake 1576.86 ml  Output   1000 ml  Net 576.86 ml   Filed Weights   11/20/14 1025 11/21/14 0008  Weight: 231 lb (104.781 kg) 226 lb 6.6 oz (102.7 kg)    PHYSICAL EXAM  General: Pleasant, NAD. Neuro: Alert and oriented X 3. Moves all extremities spontaneously. Psych: Normal affect. HEENT:  Normal  Neck: Supple without bruits or JVD. Lungs:  Resp regular and unlabored, CTA. Heart: RRR no s3, s4, or murmurs. Abdomen: Soft, non-tender, non-distended, BS + x 4.  Extremities: No clubbing, cyanosis or edema. DP/PT/Radials 2+ and equal bilaterally.  Accessory Clinical Findings  CBC  Recent Labs  11/20/14 1100 11/21/14 0306  WBC 2.9* 3.4*  HGB 14.7 14.3  HCT 40.8 38.8*  MCV 88.9 88.2  PLT 105* 97*   Basic Metabolic Panel  Recent Labs  11/20/14 1100 11/21/14 0306  NA 139 134*  K 3.9 3.7  CL 105 101  CO2 26 24  GLUCOSE 163* 233*    BUN 11 12  CREATININE 0.89 0.90  CALCIUM 9.2 8.7    TELE  NSR HR 70s  Radiology/Studies  No results found.  ASSESSMENT AND PLAN  [name] [name] is a 59 y.o. male with a history of CAD s/p LAD/OM stenting, HTN, TIA, HLD, OSA on CPAP, and DM who was admitted to [facility] on 11/20/14 for planned coronary angiography.  CAD/Accelerating angina- he was seen in the office on 11/16/14 for chest pain worrisome for [place] and was scheduled for cardiac cath on 11/20/14 -- LHC revealed  Final Conclusions:   1. 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 Normal LV function. -- s/p PCI w/ DES to pLAD and DES to OM1  --Continue DAPT with ASA/plavix for one year. Continue metoprolol 83m BID Intolerant to statins -- Radial catheter site stable  HTN- mildly elevated.  -- Continue Norvasc 2.5 mg and Altace 561m He hasn't been getting his home metoprolol 2597mID here. Will add back  HLD-History of statin intolerance, currently on TriCor.  DM- Hold metformin >  48 hours post cardiac cath.  Thrombocytopenia- chronic. Probably not a good candidate for DAPT for life. Will continue for minimum of 1 year  Signed, Crista Luria  Pager [phone]  Patient seen and examined and history reviewed. Agree with above findings and plan. Doing well this am without chest pain. Right wrist without hematoma and pulse 2+. Ecg and labs are stable. Patient is intolerant of statins. Will consider enrolling in SPIRE trial. OK for DC today. Follow up in [place] office. Encourage phase 2 Cardiac Rehab.  Fernanda Twaddell [name], [place] 11/21/2014 7:24 AM

## 2014-11-21 NOTE — Discharge Instructions (Signed)
You can safetly resume your metformin tomorrow evening ( 11/22/14 )  Radial Site Care Refer to this sheet in the next few weeks. These instructions provide you with information on caring for yourself after your procedure. Your caregiver may also give you more specific instructions. Your treatment has been planned according to current medical practices, but problems sometimes occur. Call your caregiver if you have any problems or questions after your procedure. HOME CARE INSTRUCTIONS  You may shower the day after the procedure.Remove the bandage (dressing) and gently wash the site with plain soap and water.Gently pat the site dry.   Do not apply powder or lotion to the site.   Do not submerge the affected site in water for 3 to 5 days.   Inspect the site at least twice daily.   Do not flex or bend the affected arm for 24 hours.   No lifting over 5 pounds (2.3 kg) for 5 days after your procedure.   Do not drive home if you are discharged the same day of the procedure. Have someone else drive you.   You may drive 24 hours after the procedure unless otherwise instructed by your caregiver.  What to expect:  Any bruising will usually fade within 1 to 2 weeks.   Blood that collects in the tissue (hematoma) may be painful to the touch. It should usually decrease in size and tenderness within 1 to 2 weeks.  SEEK IMMEDIATE MEDICAL CARE IF:  You have unusual pain at the radial site.   You have redness, warmth, swelling, or pain at the radial site.   You have drainage (other than a small amount of blood on the dressing).   You have chills.   You have a fever or persistent symptoms for more than 72 hours.   You have a fever and your symptoms suddenly get worse.   Your arm becomes pale, cool, tingly, or numb.   You have heavy bleeding from the site. Hold pressure on the site.

## 2014-11-21 NOTE — Discharge Summary (Signed)
Discharge Summary   Patient ID: [name] [name] MRN: [id number], DOB/AGE: 06-10-1956 59 y.o. Admit date: 11/20/2014 D/C date:     11/21/2014  Primary Cardiologist: Dr. Domenic Polite   Principal Problem:   Unstable angina Active Problems:   Mixed hyperlipidemia   Essential hypertension, benign   Obstructive sleep apnea   PVC's (premature ventricular contractions)   Type 2 diabetes mellitus   TIA (transient ischemic attack)   Coronary atherosclerosis of native coronary artery   Thrombocytopenia    Admission Dates: 11/20/14-11/21/14 Discharge Diagnosis: [place] s/p PCI w/ DES to pLAD and DES to OM1   HPI: [name] [name] is a 59 y.o. male with a history of CAD s/p LAD/OM stenting, HTN, TIA, HLD, OSA on CPAP, and DM who was admitted to [facility] on 11/20/14 for planned coronary angiography.   Hospital Course  CAD/Accelerating angina- he was seen in the office on 11/16/14 for chest pain worrisome for [place] and was scheduled for cardiac cath on 11/20/14 -- LHC revealed  1. 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 Normal LV function. -- s/p PCI w/ DES to pLAD and DES to OM1  --Continue DAPT with ASA/plavix for one year. Continue metoprolol 67m BID Intolerant to statins -- Radial catheter site stable -- Encouraged to participate in phase II cardiac rehab. He is interested in this.   HTN- mildly elevated.  -- Continue Norvasc 2.5 mg and Altace 587m He hasn't been getting his home metoprolol 253mID here. Will add back  HLD-History of statin intolerance, currently on TriCor. -- Will consider enrolling in SPIRE trial.  DM- Hold metformin >48 hours post cardiac cath.  Thrombocytopenia- chronic. Probably not a good candidate for DAPT for life. Will continue for minimum of 1 year  Hx of PVCs- continue BB   GERD- will change prilosec to protonix now that he is on plavix   The patient has had an  uncomplicated hospital course and is recovering well. The radial catheter site is stable. He has been seen by Dr. JorMartiniqueday and deemed ready for discharge home. All follow-up appointments have been scheduled. Discharge medications are listed below.    Discharge Vitals: Blood pressure 145/69, pulse 77, temperature 97.7 F (36.5 C), temperature source Oral, resp. rate 18, height 5' 9"  (1.753 m), weight 226 lb 6.6 oz (102.7 kg), SpO2 98 %.  Labs: Lab Results  Component Value Date   WBC 3.4* 11/21/2014   HGB 14.3 11/21/2014   HCT 38.8* 11/21/2014   MCV 88.2 11/21/2014   PLT 97* 11/21/2014     Recent Labs Lab 11/21/14 0306  NA 134*  K 3.7  CL 101  CO2 24  BUN 12  CREATININE 0.90  CALCIUM 8.7  GLUCOSE 233*     Diagnostic Studies/Procedures   LHC 11/20/14 Vessel #2- first OM/ mid Percent stenosis (pre) 90% TIMI flow (pre) 3 Stent 2.75 x 24 mm Promus Percent stenosis (post) 0%  TIMI flow (post) 3  Final Conclusions:  1. 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 Normal LV function.  Recommendations:  Continue DAPT for one year. Anticipate DC in am if stable.   Discharge Medications     Medication List    STOP taking these medications        ibuprofen 200 MG tablet  Commonly known as:  ADVIL,MOTRIN  omeprazole 20 MG capsule  Commonly known as:  PRILOSEC      TAKE these medications        amLODipine 2.5 MG tablet  Commonly known as:  NORVASC  Take 1 tablet (2.5 mg total) by mouth daily.     aspirin EC 81 MG tablet  Take 81 mg by mouth daily.     citalopram 20 MG tablet  Commonly known as:  CELEXA  Take 20 mg by mouth daily.     clopidogrel 75 MG tablet  Commonly known as:  PLAVIX  Take 1 tablet (75 mg total) by mouth daily with breakfast.     fluticasone 50 MCG/ACT nasal spray  Commonly known as:  FLONASE  Place 2 sprays into the nose daily.     Fluticasone-Salmeterol  250-50 MCG/DOSE Aepb  Commonly known as:  ADVAIR  Inhale 1 puff into the lungs every 12 (twelve) hours.     gabapentin 600 MG tablet  Commonly known as:  NEURONTIN  Take 1 tablet (600 mg total) by mouth 3 (three) times daily.     insulin aspart 100 UNIT/ML injection  Commonly known as:  novoLOG  Inject 2-30 Units into the skin 3 (three) times daily before meals.     LANTUS SOLOSTAR 100 UNIT/ML Solostar Pen  Generic drug:  Insulin Glargine  Inject 60 Units into the skin 2 (two) times daily.     Melatonin 5 MG Tabs  Take 10 mg by mouth at bedtime as needed (Sleep).     metFORMIN 1000 MG tablet  Commonly known as:  GLUCOPHAGE  Take 1 tablet (1,000 mg total) by mouth 2 (two) times daily.  Start taking on:  11/22/2014     metoprolol tartrate 25 MG tablet  Commonly known as:  LOPRESSOR  Take 1 tablet (25 mg total) by mouth 2 (two) times daily.     nitroGLYCERIN 0.4 MG SL tablet  Commonly known as:  NITROSTAT  Place 0.4 mg under the tongue every 5 (five) minutes as needed.     pantoprazole 40 MG tablet  Commonly known as:  PROTONIX  Take 1 tablet (40 mg total) by mouth daily.     ramipril 5 MG tablet  Commonly known as:  ALTACE  Take 5 mg by mouth daily.        Disposition   The patient will be discharged in stable condition to home.  Follow-up Information    Follow up with Rozann Lesches, MD On 12/05/2014.   Specialty:  Cardiology   Why:  @ 9:20am   Contact information:   [place]  [phone]         Duration of Discharge Encounter: Greater than 30 minutes including physician and PA time.  Mable Fill R PA-C 11/21/2014, 8:52 AM

## 2014-11-22 ENCOUNTER — Telehealth: Payer: Self-pay | Admitting: *Deleted

## 2014-11-24 ENCOUNTER — Encounter (HOSPITAL_COMMUNITY): Payer: Self-pay | Admitting: *Deleted

## 2014-11-24 ENCOUNTER — Inpatient Hospital Stay (HOSPITAL_COMMUNITY)
Admission: EM | Admit: 2014-11-24 | Discharge: 2014-11-27 | DRG: 282 | Disposition: A | Discharge status: Home / Self Care | Payer: Medicare Other | Attending: Cardiology | Admitting: Cardiology

## 2014-11-24 ENCOUNTER — Telehealth: Payer: Self-pay | Admitting: Physician Assistant

## 2014-11-24 ENCOUNTER — Emergency Department (HOSPITAL_COMMUNITY): Payer: Medicare Other

## 2014-11-24 DIAGNOSIS — M1990 Unspecified osteoarthritis, unspecified site: Secondary | ICD-10-CM | POA: Diagnosis present

## 2014-11-24 DIAGNOSIS — Z794 Long term (current) use of insulin: Secondary | ICD-10-CM | POA: Diagnosis not present

## 2014-11-24 DIAGNOSIS — I10 Essential (primary) hypertension: Secondary | ICD-10-CM | POA: Diagnosis present

## 2014-11-24 DIAGNOSIS — E782 Mixed hyperlipidemia: Secondary | ICD-10-CM | POA: Diagnosis present

## 2014-11-24 DIAGNOSIS — G4733 Obstructive sleep apnea (adult) (pediatric): Secondary | ICD-10-CM | POA: Diagnosis present

## 2014-11-24 DIAGNOSIS — I493 Ventricular premature depolarization: Secondary | ICD-10-CM | POA: Diagnosis present

## 2014-11-24 DIAGNOSIS — K219 Gastro-esophageal reflux disease without esophagitis: Secondary | ICD-10-CM | POA: Diagnosis present

## 2014-11-24 DIAGNOSIS — E119 Type 2 diabetes mellitus without complications: Secondary | ICD-10-CM

## 2014-11-24 DIAGNOSIS — G459 Transient cerebral ischemic attack, unspecified: Secondary | ICD-10-CM | POA: Diagnosis present

## 2014-11-24 DIAGNOSIS — Z8673 Personal history of transient ischemic attack (TIA), and cerebral infarction without residual deficits: Secondary | ICD-10-CM | POA: Diagnosis not present

## 2014-11-24 DIAGNOSIS — Z7902 Long term (current) use of antithrombotics/antiplatelets: Secondary | ICD-10-CM | POA: Diagnosis not present

## 2014-11-24 DIAGNOSIS — I25110 Atherosclerotic heart disease of native coronary artery with unstable angina pectoris: Secondary | ICD-10-CM | POA: Diagnosis present

## 2014-11-24 DIAGNOSIS — I214 Non-ST elevation (NSTEMI) myocardial infarction: Secondary | ICD-10-CM | POA: Diagnosis present

## 2014-11-24 DIAGNOSIS — D696 Thrombocytopenia, unspecified: Secondary | ICD-10-CM | POA: Diagnosis present

## 2014-11-24 DIAGNOSIS — R079 Chest pain, unspecified: Secondary | ICD-10-CM | POA: Diagnosis present

## 2014-11-24 DIAGNOSIS — E669 Obesity, unspecified: Secondary | ICD-10-CM | POA: Diagnosis present

## 2014-11-24 DIAGNOSIS — E1169 Type 2 diabetes mellitus with other specified complication: Secondary | ICD-10-CM

## 2014-11-24 DIAGNOSIS — Z7982 Long term (current) use of aspirin: Secondary | ICD-10-CM | POA: Diagnosis not present

## 2014-11-24 DIAGNOSIS — Z955 Presence of coronary angioplasty implant and graft: Secondary | ICD-10-CM | POA: Diagnosis not present

## 2014-11-24 DIAGNOSIS — E1165 Type 2 diabetes mellitus with hyperglycemia: Secondary | ICD-10-CM

## 2014-11-24 LAB — CBC WITH DIFFERENTIAL/PLATELET
Basophils Absolute: 0 10*3/uL (ref 0.0–0.1)
Basophils Relative: 0 % (ref 0–1)
Eosinophils Absolute: 0.1 10*3/uL (ref 0.0–0.7)
Eosinophils Relative: 2 % (ref 0–5)
HCT: 39.5 % (ref 39.0–52.0)
Hemoglobin: 14.1 g/dL (ref 13.0–17.0)
LYMPHS PCT: 34 % (ref 12–46)
Lymphs Abs: 1.2 10*3/uL (ref 0.7–4.0)
MCH: 31.6 pg (ref 26.0–34.0)
MCHC: 35.7 g/dL (ref 30.0–36.0)
MCV: 88.6 fL (ref 78.0–100.0)
MONOS PCT: 6 % (ref 3–12)
Monocytes Absolute: 0.2 10*3/uL (ref 0.1–1.0)
NEUTROS PCT: 58 % (ref 43–77)
Neutro Abs: 2 10*3/uL (ref 1.7–7.7)
Platelets: 120 10*3/uL — ABNORMAL LOW (ref 150–400)
RBC: 4.46 MIL/uL (ref 4.22–5.81)
RDW: 12.7 % (ref 11.5–15.5)
WBC: 3.4 10*3/uL — ABNORMAL LOW (ref 4.0–10.5)

## 2014-11-24 LAB — BASIC METABOLIC PANEL
Anion gap: 8 (ref 5–15)
BUN: 16 mg/dL (ref 6–23)
CHLORIDE: 105 meq/L (ref 96–112)
CO2: 23 mmol/L (ref 19–32)
Calcium: 9 mg/dL (ref 8.4–10.5)
Creatinine, Ser: 0.9 mg/dL (ref 0.50–1.35)
GFR calc non Af Amer: >90 mL/min (ref >90)
GLUCOSE: 291 mg/dL — AB (ref 70–99)
POTASSIUM: 4.1 mmol/L (ref 3.5–5.1)
Sodium: 136 mmol/L (ref 135–145)

## 2014-11-24 LAB — GLUCOSE, CAPILLARY: Glucose-Capillary: 193 mg/dL — ABNORMAL HIGH (ref 70–99)

## 2014-11-24 LAB — BRAIN NATRIURETIC PEPTIDE: B Natriuretic Peptide: 20.8 pg/mL (ref 0.0–100.0)

## 2014-11-24 LAB — MRSA PCR SCREENING: MRSA BY PCR: NEGATIVE

## 2014-11-24 LAB — TROPONIN I
Troponin I: 0.15 ng/mL — ABNORMAL HIGH (ref <0.031)
Troponin I: 0.16 ng/mL — ABNORMAL HIGH (ref <0.031)

## 2014-11-24 MED ORDER — ONDANSETRON HCL 4 MG/2ML IJ SOLN
4.0000 mg | Freq: Once | INTRAMUSCULAR | Status: AC
  Administered 2014-11-24: 4 mg via INTRAVENOUS
  Filled 2014-11-24: qty 2

## 2014-11-24 MED ORDER — RAMIPRIL 5 MG PO TABS: 5.0000 mg | ORAL_TABLET | Freq: Every day | ORAL | Status: DC

## 2014-11-24 MED ORDER — FLUTICASONE PROPIONATE 50 MCG/ACT NA SUSP
1.0000 | Freq: Two times a day (BID) | NASAL | Status: DC
  Administered 2014-11-24 – 2014-11-27 (×6): 1 via NASAL
  Filled 2014-11-24: qty 16

## 2014-11-24 MED ORDER — NITROGLYCERIN 0.4 MG SL SUBL
0.4000 mg | SUBLINGUAL_TABLET | Freq: Once | SUBLINGUAL | Status: AC
  Administered 2014-11-24: 0.4 mg via SUBLINGUAL

## 2014-11-24 MED ORDER — ASPIRIN 81 MG PO CHEW
324.0000 mg | CHEWABLE_TABLET | Freq: Once | ORAL | Status: AC
  Administered 2014-11-24: 324 mg via ORAL

## 2014-11-24 MED ORDER — ASPIRIN 300 MG RE SUPP: 300.0000 mg | RECTAL | Status: DC

## 2014-11-24 MED ORDER — INSULIN GLARGINE 100 UNIT/ML SOLOSTAR PEN: 60.0000 [IU] | PEN_INJECTOR | Freq: Two times a day (BID) | SUBCUTANEOUS | Status: DC

## 2014-11-24 MED ORDER — ACETAMINOPHEN 325 MG PO TABS
650.0000 mg | ORAL_TABLET | ORAL | Status: DC | PRN
  Administered 2014-11-24 – 2014-11-25 (×2): 650 mg via ORAL
  Filled 2014-11-24 (×2): qty 2

## 2014-11-24 MED ORDER — MORPHINE SULFATE 2 MG/ML IJ SOLN
2.0000 mg | INTRAMUSCULAR | Status: DC | PRN
  Administered 2014-11-25 (×3): 2 mg via INTRAVENOUS
  Administered 2014-11-26: 4 mg via INTRAVENOUS
  Administered 2014-11-26: 2 mg via INTRAVENOUS
  Filled 2014-11-24 (×3): qty 1
  Filled 2014-11-24: qty 2
  Filled 2014-11-24: qty 1

## 2014-11-24 MED ORDER — RAMIPRIL 5 MG PO CAPS
5.0000 mg | ORAL_CAPSULE | Freq: Every day | ORAL | Status: DC
  Administered 2014-11-25 – 2014-11-27 (×3): 5 mg via ORAL
  Filled 2014-11-24 (×3): qty 1

## 2014-11-24 MED ORDER — MELATONIN 5 MG PO TABS: 10.0000 mg | ORAL_TABLET | Freq: Every day | ORAL | Status: DC

## 2014-11-24 MED ORDER — NITROGLYCERIN 0.4 MG SL SUBL
SUBLINGUAL_TABLET | SUBLINGUAL | Status: AC
  Filled 2014-11-24: qty 1

## 2014-11-24 MED ORDER — NITROGLYCERIN IN D5W 200-5 MCG/ML-% IV SOLN: 5.0000 ug/min | INTRAVENOUS | Status: DC

## 2014-11-24 MED ORDER — METOPROLOL TARTRATE 25 MG PO TABS
25.0000 mg | ORAL_TABLET | Freq: Two times a day (BID) | ORAL | Status: DC
  Administered 2014-11-24 – 2014-11-27 (×6): 25 mg via ORAL
  Filled 2014-11-24 (×6): qty 1

## 2014-11-24 MED ORDER — ACETAMINOPHEN 325 MG PO TABS
650.0000 mg | ORAL_TABLET | Freq: Once | ORAL | Status: AC
  Administered 2014-11-24: 650 mg via ORAL

## 2014-11-24 MED ORDER — INSULIN ASPART 100 UNIT/ML ~~LOC~~ SOLN
0.0000 [IU] | Freq: Three times a day (TID) | SUBCUTANEOUS | Status: DC
  Administered 2014-11-25: 3 [IU] via SUBCUTANEOUS
  Administered 2014-11-25: 11 [IU] via SUBCUTANEOUS
  Administered 2014-11-25: 4 [IU] via SUBCUTANEOUS
  Administered 2014-11-26: 3 [IU] via SUBCUTANEOUS
  Administered 2014-11-27: 4 [IU] via SUBCUTANEOUS

## 2014-11-24 MED ORDER — ONDANSETRON HCL 4 MG/2ML IJ SOLN
INTRAMUSCULAR | Status: AC
  Administered 2014-11-24: 4 mg via INTRAVENOUS
  Filled 2014-11-24: qty 2

## 2014-11-24 MED ORDER — INSULIN ASPART 100 UNIT/ML ~~LOC~~ SOLN: 2.0000 [IU] | Freq: Three times a day (TID) | SUBCUTANEOUS | Status: DC

## 2014-11-24 MED ORDER — NITROGLYCERIN 0.4 MG SL SUBL: 0.4000 mg | SUBLINGUAL_TABLET | SUBLINGUAL | Status: DC | PRN

## 2014-11-24 MED ORDER — NITROGLYCERIN IN D5W 200-5 MCG/ML-% IV SOLN
INTRAVENOUS | Status: AC
  Administered 2014-11-24: 5 ug/min via INTRAVENOUS
  Filled 2014-11-24: qty 250

## 2014-11-24 MED ORDER — ASPIRIN EC 81 MG PO TBEC: 81.0000 mg | DELAYED_RELEASE_TABLET | Freq: Every day | ORAL | Status: DC

## 2014-11-24 MED ORDER — ONDANSETRON HCL 4 MG/2ML IJ SOLN
4.0000 mg | Freq: Once | INTRAMUSCULAR | Status: AC
  Administered 2014-11-24: 4 mg via INTRAVENOUS

## 2014-11-24 MED ORDER — MORPHINE SULFATE 4 MG/ML IJ SOLN
4.0000 mg | Freq: Once | INTRAMUSCULAR | Status: AC
  Administered 2014-11-24: 4 mg via INTRAVENOUS
  Filled 2014-11-24: qty 1

## 2014-11-24 MED ORDER — MOMETASONE FURO-FORMOTEROL FUM 100-5 MCG/ACT IN AERO
2.0000 | INHALATION_SPRAY | Freq: Two times a day (BID) | RESPIRATORY_TRACT | Status: DC
  Administered 2014-11-25 – 2014-11-27 (×5): 2 via RESPIRATORY_TRACT
  Filled 2014-11-24 (×2): qty 8.8

## 2014-11-24 MED ORDER — ASPIRIN EC 81 MG PO TBEC
81.0000 mg | DELAYED_RELEASE_TABLET | Freq: Every day | ORAL | Status: DC
  Administered 2014-11-25 – 2014-11-27 (×3): 81 mg via ORAL
  Filled 2014-11-24 (×3): qty 1

## 2014-11-24 MED ORDER — ALBUTEROL SULFATE (2.5 MG/3ML) 0.083% IN NEBU: 2.5000 mg | INHALATION_SOLUTION | Freq: Four times a day (QID) | RESPIRATORY_TRACT | Status: DC | PRN

## 2014-11-24 MED ORDER — ACETAMINOPHEN 325 MG PO TABS
ORAL_TABLET | ORAL | Status: AC
  Filled 2014-11-24: qty 2

## 2014-11-24 MED ORDER — ASPIRIN 81 MG PO CHEW
CHEWABLE_TABLET | ORAL | Status: AC
  Filled 2014-11-24: qty 4

## 2014-11-24 MED ORDER — ASPIRIN 81 MG PO CHEW: 324.0000 mg | CHEWABLE_TABLET | ORAL | Status: DC

## 2014-11-24 MED ORDER — CITALOPRAM HYDROBROMIDE 20 MG PO TABS
20.0000 mg | ORAL_TABLET | Freq: Every day | ORAL | Status: DC
  Administered 2014-11-24 – 2014-11-26 (×3): 20 mg via ORAL
  Filled 2014-11-24 (×3): qty 1

## 2014-11-24 MED ORDER — NITROGLYCERIN 0.4 MG SL SUBL
0.4000 mg | SUBLINGUAL_TABLET | SUBLINGUAL | Status: DC | PRN
  Administered 2014-11-24: 0.4 mg via SUBLINGUAL

## 2014-11-24 MED ORDER — ONDANSETRON HCL 4 MG/2ML IJ SOLN
4.0000 mg | Freq: Four times a day (QID) | INTRAMUSCULAR | Status: DC | PRN
  Administered 2014-11-24 – 2014-11-26 (×2): 4 mg via INTRAVENOUS
  Filled 2014-11-24 (×2): qty 2

## 2014-11-24 MED ORDER — HEPARIN BOLUS VIA INFUSION
2000.0000 [IU] | Freq: Once | INTRAVENOUS | Status: AC
  Administered 2014-11-24: 2000 [IU] via INTRAVENOUS

## 2014-11-24 MED ORDER — CLOPIDOGREL BISULFATE 75 MG PO TABS
75.0000 mg | ORAL_TABLET | Freq: Every day | ORAL | Status: DC
  Administered 2014-11-24 – 2014-11-26 (×3): 75 mg via ORAL
  Filled 2014-11-24 (×3): qty 1

## 2014-11-24 MED ORDER — AMLODIPINE BESYLATE 2.5 MG PO TABS
2.5000 mg | ORAL_TABLET | Freq: Every day | ORAL | Status: DC
  Administered 2014-11-24 – 2014-11-26 (×3): 2.5 mg via ORAL
  Filled 2014-11-24 (×3): qty 1

## 2014-11-24 MED ORDER — PANTOPRAZOLE SODIUM 40 MG PO TBEC
40.0000 mg | DELAYED_RELEASE_TABLET | Freq: Every day | ORAL | Status: DC
  Administered 2014-11-25 – 2014-11-27 (×3): 40 mg via ORAL
  Filled 2014-11-24 (×3): qty 1

## 2014-11-24 MED ORDER — ALBUTEROL SULFATE HFA 108 (90 BASE) MCG/ACT IN AERS: 2.0000 | INHALATION_SPRAY | Freq: Four times a day (QID) | RESPIRATORY_TRACT | Status: DC | PRN

## 2014-11-24 MED ORDER — GABAPENTIN 400 MG PO CAPS
400.0000 mg | ORAL_CAPSULE | Freq: Three times a day (TID) | ORAL | Status: DC
  Administered 2014-11-24 – 2014-11-27 (×8): 400 mg via ORAL
  Filled 2014-11-24 (×8): qty 1

## 2014-11-24 MED ORDER — NITROGLYCERIN IN D5W 200-5 MCG/ML-% IV SOLN
5.0000 ug/min | Freq: Once | INTRAVENOUS | Status: AC
  Administered 2014-11-24: 5 ug/min via INTRAVENOUS

## 2014-11-24 MED ORDER — INSULIN GLARGINE 100 UNIT/ML ~~LOC~~ SOLN
60.0000 [IU] | Freq: Two times a day (BID) | SUBCUTANEOUS | Status: DC
  Administered 2014-11-24: 60 [IU] via SUBCUTANEOUS
  Administered 2014-11-25: 30 [IU] via SUBCUTANEOUS
  Administered 2014-11-25 – 2014-11-26 (×2): 60 [IU] via SUBCUTANEOUS
  Filled 2014-11-24 (×7): qty 0.6

## 2014-11-24 MED ORDER — NITROGLYCERIN IN D5W 200-5 MCG/ML-% IV SOLN
5.0000 ug/min | Freq: Once | INTRAVENOUS | Status: DC
Start: 2014-11-24 — End: 2014-11-24

## 2014-11-24 MED ORDER — HEPARIN (PORCINE) IN NACL 100-0.45 UNIT/ML-% IJ SOLN
1200.0000 [IU]/h | INTRAMUSCULAR | Status: DC
  Administered 2014-11-24 – 2014-11-26 (×3): 1200 [IU]/h via INTRAVENOUS
  Filled 2014-11-24 (×3): qty 250

## 2014-11-24 NOTE — ED Notes (Signed)
Attempted to call report to 3W at [facility].  RN in report, will call back.

## 2014-11-24 NOTE — ED Notes (Signed)
Pt admits to taking one SL NTG pta, denies taking ASA today

## 2014-11-24 NOTE — H&P (Signed)
Patient ID: [name] [name] MRN: [id number], DOB/AGE: [birth year]   Admit date: 11/24/2014   Primary Physician: Deloria Lair, MD Primary Cardiologist:Dr. Domenic Polite  Pt. Profile:  [name] [name] is a 59 y.o. male with a history of CAD s/p LAD/OM stenting, HTN, TIA, HLD, OSA on CPAP, and DM who is readmitted with an NSTEMI in the setting of recent uncomplicated elective PCI of the pLAD (ISR) and OM1.    Problem List  Past Medical History  Diagnosis Date  . Coronary atherosclerosis of native coronary artery     a. h/o LAD & OM stenting;  b. 2007 Cath: nonobs dzs, patent stents;  c. 07/2012 neg MV, EF 61%.  d. 11/20/14: [place] s/p  PCI w/ DES to pLAD and DES to OM1   . Asthma   . Essential hypertension, benign   . TIA (transient ischemic attack) ~ 2010  . Anxiety   . Hypercholesteremia   . GERD (gastroesophageal reflux disease)   . OSA on CPAP     "suppose to wear mask; don't always" (11/20/2014)  . Type 2 diabetes mellitus   . History of hiatal hernia   . Migraine     "maybe monthly" (11/20/2014)  . Arthritis     "hands" (11/20/2014)  . Lumbar herniated disc   . Chronic lower back pain   . History of gout   . Thrombocytopenia     Past Surgical History  Procedure Laterality Date  . Anterior cervical decomp/discectomy fusion  1998    "C3-4"  . Carpal tunnel release Bilateral 2005  . Shoulder arthroscopy Left ~ 2011  . Knee surgery Left 02/2012    "scraped; open"  . Neuroplasty / transposition ulnar nerve at elbow Right ~ 2012  . Left heart catheterization with coronary angiogram N/A 07/20/2012    Procedure: LEFT HEART CATHETERIZATION WITH CORONARY ANGIOGRAM;  Surgeon: [name], MD;  Location: [facility] CATH LAB;  Service: Cardiovascular;  Laterality: N/A;  . Coronary angioplasty with stent placement  2002; 2003; 11/20/2014    "I have 4 stents after today" (11/20/2014)  . Cardiac catheterization  "several"  . Coronary angioplasty    . Left heart catheterization with coronary  angiogram N/A 11/20/2014    Procedure: LEFT HEART CATHETERIZATION WITH CORONARY ANGIOGRAM;  Surgeon: [name], MD;  Location: [facility] CATH LAB;  Service: Cardiovascular;  Laterality: N/A;  . Percutaneous coronary rotoblator intervention (pci-r)  11/20/2014    Procedure: PERCUTANEOUS CORONARY ROTOBLATOR INTERVENTION (PCI-R);  Surgeon: [name], MD;  Location: [facility] CATH LAB;  Service: Cardiovascular;;     Allergies  No Known Allergies  HPI  [name] [name] is a 59 y.o. male with a history of CAD s/p LAD/OM stenting, HTN, TIA, HLD, OSA on CPAP, and DM who was admitted to [facility] on 11/20/14 for planned coronary angiography which demonstrated ISR in the pLAD and a new lesion in the OM1. He underwent uncomplicated PCI with DES to both lesions and was discharged on 11/21/14 with plans for 1 year of DAPT with clopidogrel.   He reported that after leaving [facility] on 11/21/14, he did begin to feel a slight "sharp pain" in his chest. On Thursday he additionally felt a pressure sensation and the pain grew to 8/10 in severity. Pain was worse with exertion and was improved with rest and SL NTG.  He reported associated nausea, dyspnea, and sweats. On Friday, the pain also felt like an ache at times and radiated to his shoulder and arm. He states the pain  is the same quality of pain that he had prior to his PCI, but it is less severe now. Denied missing any DAPT doses since discharge. Due to recurrent symptoms, the patient's wife forced him to be evaluated the day of admission and brought [facility] to the [facility].   In the [facility] ED, he was hemodynamically stable: 134/75, 83, 98% on RA.  He reported 5/10 CP. He was given aspirin, heparin, and started on a nitro drip. ECG was essentially unchanged. CXR was unremarkable. Initial labs notable for glucose 291, TnI 0.15 (ULN 0.03, no post PCI biomarkers were checked after 1/12 PCI).  Home Medications  Prior to Admission medications   Medication Sig Start Date End Date Taking?  Authorizing Provider  albuterol (PROVENTIL HFA;VENTOLIN HFA) 108 (90 BASE) MCG/ACT inhaler Inhale 2 puffs into the lungs every 6 (six) hours as needed for wheezing or shortness of breath.   Yes Historical Provider, MD  amLODipine (NORVASC) 2.5 MG tablet Take 1 tablet (2.5 mg total) by mouth daily. Patient taking differently: Take 2.5 mg by mouth at bedtime.  11/16/14  Yes [name], MD  aspirin EC 81 MG tablet Take 81 mg by mouth at bedtime.    Yes Historical Provider, MD  citalopram (CELEXA) 20 MG tablet Take 20 mg by mouth at bedtime.    Yes Historical Provider, MD  clopidogrel (PLAVIX) 75 MG tablet Take 1 tablet (75 mg total) by mouth daily with breakfast. Patient taking differently: Take 75 mg by mouth at bedtime.  11/21/14  Yes Eileen Stanford, PA-C  fluticasone (FLONASE) 50 MCG/ACT nasal spray Place 1 spray into the nose 2 (two) times daily.    Yes Historical Provider, MD  Fluticasone-Salmeterol (ADVAIR) 250-50 MCG/DOSE AEPB Inhale 1 puff into the lungs every 12 (twelve) hours.   Yes Historical Provider, MD  gabapentin (NEURONTIN) 400 MG capsule Take 400 mg by mouth 3 (three) times daily.   Yes Historical Provider, MD  insulin aspart (NOVOLOG) 100 UNIT/ML injection Inject 2-30 Units into the skin 3 (three) times daily before meals.    Yes Historical Provider, MD  Insulin Glargine (LANTUS SOLOSTAR) 100 UNIT/ML Solostar Pen Inject 60 Units into the skin 2 (two) times daily.   Yes Historical Provider, MD  Melatonin 5 MG TABS Take 10 mg by mouth at bedtime.    Yes Historical Provider, MD  metFORMIN (GLUCOPHAGE) 1000 MG tablet Take 1 tablet (1,000 mg total) by mouth 2 (two) times daily. 11/22/14  Yes Eileen Stanford, PA-C  metoprolol tartrate (LOPRESSOR) 25 MG tablet Take 1 tablet (25 mg total) by mouth 2 (two) times daily. 11/21/14  Yes Eileen Stanford, PA-C  nitroGLYCERIN (NITROSTAT) 0.4 MG SL tablet Place 0.4 mg under the tongue every 5 (five) minutes as needed.   Yes Historical  Provider, MD  omeprazole (PRILOSEC) 20 MG capsule Take 20 mg by mouth 2 (two) times daily.   Yes Historical Provider, MD  ramipril (ALTACE) 5 MG tablet Take 5 mg by mouth daily.   Yes Historical Provider, MD  gabapentin (NEURONTIN) 600 MG tablet Take 1 tablet (600 mg total) by mouth 3 (three) times daily. Patient not taking: Reported on 11/24/2014 08/18/13   Hulen Luster, DO  pantoprazole (PROTONIX) 40 MG tablet Take 1 tablet (40 mg total) by mouth daily. Patient not taking: Reported on 11/24/2014 11/21/14   Eileen Stanford, PA-C    Family History  History reviewed. No pertinent family history.  Social History  History   Social  History  . Marital Status: Married    Spouse Name: [name]    Number of Children: 3  . Years of Education: 12 th    Occupational History  . Not on file.   Social History Main Topics  . Smoking status: Never Smoker   . Smokeless tobacco: Never Used  . Alcohol Use: No  . Drug Use: No  . Sexual Activity: Yes   Other Topics Concern  . Not on file   Social History Narrative   Patient lives at home with wife [name].   Patient does not work.    Patient has a 12 th grade education.    Patient has 3 children.            Review of Systems General:  No chills, fever Cardiovascular:  See HPI Dermatological: No rash, lesions/masses Respiratory: No cough Urologic: No hematuria, dysuria Abdominal:   No  vomiting, bright red blood per rectum, melena, or hematemesis. Some diarrhea which he attributes to new meds. Neurologic:  No visual changes, wkns, changes in mental status. All other systems reviewed and are otherwise negative except as noted above.  Physical Exam  Blood pressure 126/63, pulse 82, temperature 98.3 F (36.8 C), temperature source Oral, resp. rate 15, height 5' 9"  (1.753 m), weight 104.781 kg (231 lb), SpO2 98 %.  General: Pleasant, NAD Psych: Normal affect. Neuro: Alert and oriented X 3. Moves all extremities  spontaneously. HEENT: Normal  Neck: Supple without bruits or JVD. Lungs:  Resp regular and unlabored, CTA. Heart: RRR no s3, s4, or murmurs. Abdomen: Soft, non-tender, non-distended, BS + x 4.  Extremities: No clubbing, cyanosis or edema. DP/PT/Radials 2+ and equal bilaterally.  Labs  Troponin (Point of Care Test) No results for input(s): TROPIPOC in the last 72 hours.  Recent Labs  11/24/14 1605  TROPONINI 0.15*   Lab Results  Component Value Date   WBC 3.4* 11/24/2014   HGB 14.1 11/24/2014   HCT 39.5 11/24/2014   MCV 88.6 11/24/2014   PLT 120* 11/24/2014    Recent Labs Lab 11/24/14 1605  NA 136  K 4.1  CL 105  CO2 23  BUN 16  CREATININE 0.90  CALCIUM 9.0  GLUCOSE 291*   Lab Results  Component Value Date   CHOL 158 12/04/2013   HDL 27* 12/04/2013   LDLCALC 102* 12/04/2013   TRIG 143 12/04/2013   Lab Results  Component Value Date   DDIMER 0.54* 07/17/2013     Radiology/Studies  Dg Chest Portable 1 View  11/24/2014   CLINICAL DATA:  Chest pain.  EXAM: PORTABLE CHEST - 1 VIEW  COMPARISON:  04/14/2014.  FINDINGS: The cardiac silhouette remains near the upper limit of normal in size. Clear lungs with normal vascularity. Unremarkable bones.  IMPRESSION: No acute abnormality.   Electronically Signed   By: Enrique Sack M.D.   On: 11/24/2014 16:17   TTE 12/04/13 - Study data: Technically adequate study. - Left ventricle: The cavity size was normal. Wall thickness was increased in a pattern of mild LVH. Systolic function was normal. The estimated ejection fraction was in the range of 60% to 65%. Wall motion was normal; there were no regional wall motion abnormalities. Doppler parameters are consistent with abnormal left ventricular relaxation (grade 1 diastolic dysfunction). - Aortic valve: Mildly to moderately calcified annulus. Trileaflet; mildly thickened leaflets. Mild regurgitation. Valve area: 2.32cm^2(VTI). Valve area: 2.11cm^2 (Vmax). -  Mitral valve: Mildly calcified annulus. Mildly thickened leaflets .  11/20/14 Cardiac Cath and  PCI PROCEDURAL FINDINGS  Hemodynamics:  AO 133/74 mean 100 mm Hg  LV 136/14 mm Hg  Coronary angiography:  Coronary dominance: right  Left mainstem: Normal  Left anterior descending (LAD): The LAD has a long segment of stent in the proximal to mid vessel. Within the stent there is a focal 80-90% stenosis prior to the first diagonal. There is 50% stenosis at the distal stent margin.  Left circumflex (LCx): The LCx gives rise to a large first OM that bifurcates into a larger branch and a small side branch. There is a long 90% stenosis in the mid OM. The small side branch has 40-50% disease at the origin.  Right coronary artery (RCA): Diffuse calcification without obstructive disease.  Left ventriculography: Left ventricular systolic function is normal, LVEF is estimated at 55-65%, there is no significant mitral regurgitation   PCI Note: Following the diagnostic procedure, the decision was made to proceed with PCI. Weight-based bivalirudin was given for anticoagulation. Plavix 600 mg was given orally. Once a therapeutic ACT was achieved, a 6 [place] XBLAD 3.5 guide catheter was inserted. A prowater coronary guidewire was used to cross the lesion in the LAD. The lesion was predilated with a 2.5 mm balloon. Because I was unable to deliver the stent the lesion was dilated again with a 3.0 mm noncompliant balloon. The lesion was then stented with a 3.0 x 16 mm Promus stent. The stent was postdilated with a 3.0 mm noncompliant balloon. Following PCI, there was 0% residual stenosis and TIMI-3 flow.  We next addressed the lesion in the first OM. The lesion was crossed with a prowater wire. The lesion was predilated with a 2.5 mm balloon. The lesion was then stented with a 2.75 x 24 mm Promus stent. The stent was postdilated with a 3.0 mm noncompliant balloon. Following PCI, there was 0% residual stenosis and TIMI  grade 3 flow. Final angiography confirmed an excellent result. The patient tolerated the procedure well. There were no immediate procedural complications. A TR band was used for radial hemostasis. The patient was transferred to the post catheterization recovery area for further monitoring.   PCI Data:   Vessel - LAD/Segment - Proximal  Percent Stenosis (pre) 80-90%  TIMI-flow 3  Stent 3.0 x 16 mm Promus  Percent Stenosis (post) 0%  TIMI-flow (post) 3   Vessel #2- first OM/ mid  Percent stenosis (pre) 90%  TIMI flow (pre) 3  Stent 2.75 x 24 mm Promus  Percent stenosis (post) 0%  TIMI flow (post) 3   Final Conclusions:  1. 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 Normal LV function.   ECG  11/24/14 @ 15:47: NSR.  11/21/14: NSR. Poor R wave progression which appears to be related to lead position based on review of other ECGs.   ASSESSMENT AND PLAN  [name] [name] is a 59 y.o. male with a history of CAD s/p LAD/OM stenting, HTN, TIA, HLD, OSA on CPAP, and DM who is readmitted with an NSTEMI in the setting of uncomplicated elective PCI of the pLAD (ISR) and OM1. Although it is possible that the positive troponin is related to a periprocedural MI (Type 4a), presence of typical chest pain raises concern for either Type I (spontaneous) or early Type 4b (in stent thrombosis) without typical frank ST elevation.   1. Continue ASA, plavix, metoprolol, norvasc 2. Continue UFH and nitro gtt 3. Not on statin due to  history of intolerance; had similar issues with fibrate 4. Hold metformin for probable cath, continue lantus/aspart, check HgbA1c 5. TTE 6. Plan for cath Monday - or earlier if needed 7. A1c, lipids  Signed, Lamar Sprinkles, MD 11/24/2014, 5:38 PM

## 2014-11-24 NOTE — Telephone Encounter (Signed)
Patient called in with concerns of chest pain. He had recent PCI for [place] last week (DES to the LAD and DES to the OM1).  He notes a few episodes of chest pain since DC. The heaviness occurs with activity and now it is at rest. +SOB. Symptoms are similar to what he had prior to PCI.  But, these are not as bad. Theses symptoms seem to be progressively worsening. Notes SOB at rest. + Neck radiation + nausea Has not taken any NTG.  I have advised him to take NTG.  He should then proceed to the nearest ED [facility]).  If his chest discomfort does not resolve, he will need to call 911.  He agrees with this plan. Richardson Dopp, PA-C   11/24/2014 2:53 PM

## 2014-11-24 NOTE — ED Notes (Signed)
Pt with left sided cp since Thursday, recent cardiac cath with stent placement on Tuesday, contacted cardiologist ([name])  today and instructed to come here

## 2014-11-24 NOTE — Progress Notes (Signed)
[place] for Heparin Indication: chest pain/ACS  No Known Allergies  Patient Measurements: Height: 5' 9"  (175.3 cm) Weight: 231 lb (104.781 kg) IBW/kg (Calculated) : 70.7 Heparin Dosing Weight: 93 kg  Vital Signs: Temp: 98.3 F (36.8 C) (01/16 1548) Temp Source: Oral (01/16 1548) BP: 134/75 mmHg (01/16 1548)  Labs:  Recent Labs  11/24/14 1605  HGB 14.1  HCT 39.5  PLT 120*  CREATININE 0.90  TROPONINI 0.15*    Estimated Creatinine Clearance: 109.3 mL/min (by C-G formula based on Cr of 0.9).   Medical History: Past Medical History  Diagnosis Date  . Coronary atherosclerosis of native coronary artery     a. h/o LAD & OM stenting;  b. 2007 Cath: nonobs dzs, patent stents;  c. 07/2012 neg MV, EF 61%.  d. 11/20/14: [place] s/p  PCI w/ DES to pLAD and DES to OM1   . Asthma   . Essential hypertension, benign   . TIA (transient ischemic attack) ~ 2010  . Anxiety   . Hypercholesteremia   . GERD (gastroesophageal reflux disease)   . OSA on CPAP     "suppose to wear mask; don't always" (11/20/2014)  . Type 2 diabetes mellitus   . History of hiatal hernia   . Migraine     "maybe monthly" (11/20/2014)  . Arthritis     "hands" (11/20/2014)  . Lumbar herniated disc   . Chronic lower back pain   . History of gout   . Thrombocytopenia     Medications:   (Not in a hospital admission)  Home meds reviewed, Plavis and ASA @ home.  Assessment: Okay for Protocol, 2 day history of intermittent left sided chest pain, radiating to L arm. Worse with exertion, better with rest. Associated with shortness of breath and nausea. Patient underwent catheterization 4 days ago and had 2 stents placed.  Low PLTCs noted.  PT/INR WNL last week.  Goal of Therapy:  Heparin level 0.3-0.7 units/ml Monitor platelets by anticoagulation protocol: Yes   Plan:  Give 2000 units bolus x 1 Start heparin infusion at 1200 units/hr Check anti-Xa level in 6-8 hours and  daily while on heparin Continue to monitor H&H and platelets  Pricilla Larsson 11/24/2014,4:50 PM

## 2014-11-24 NOTE — ED Provider Notes (Signed)
CSN: [id number]     Arrival date & time 11/24/14  1541 History   First MD Initiated Contact with Patient 11/24/14 1549     Chief Complaint  Patient presents with  . Chest Pain     (Consider location/radiation/quality/duration/timing/severity/associated sxs/prior Treatment) HPI Comments: Patient with 2 day history of intermittent left sided chest pain, radiating to L arm. Worse with exertion, better with rest. Associated with shortness of breath and nausea. Patient underwent catheterization 4 days ago and had 2 stents placed. His pain today is similar to prior to having a stents placed. The chest pain is exertional and better with rest. There is no abdominal pain. It radiates to his left shoulder and shoulder blade. He took nitroglycerin at home with partial relief. He did not take aspirin today. He called his cardiologist and was instructed to come to the ED.  The history is provided by the patient.    Past Medical History  Diagnosis Date  . Coronary atherosclerosis of native coronary artery     a. h/o LAD & OM stenting;  b. 2007 Cath: nonobs dzs, patent stents;  c. 07/2012 neg MV, EF 61%.  d. 11/20/14: [place] s/p  PCI w/ DES to pLAD and DES to OM1   . Asthma   . Essential hypertension, benign   . TIA (transient ischemic attack) ~ 2010  . Anxiety   . Hypercholesteremia   . GERD (gastroesophageal reflux disease)   . OSA on CPAP     "suppose to wear mask; don't always" (11/20/2014)  . Type 2 diabetes mellitus   . History of hiatal hernia   . Migraine     "maybe monthly" (11/20/2014)  . Arthritis     "hands" (11/20/2014)  . Lumbar herniated disc   . Chronic lower back pain   . History of gout   . Thrombocytopenia    Past Surgical History  Procedure Laterality Date  . Anterior cervical decomp/discectomy fusion  1998    "C3-4"  . Carpal tunnel release Bilateral 2005  . Shoulder arthroscopy Left ~ 2011  . Knee surgery Left 02/2012    "scraped; open"  . Neuroplasty / transposition  ulnar nerve at elbow Right ~ 2012  . Left heart catheterization with coronary angiogram N/A 07/20/2012    Procedure: LEFT HEART CATHETERIZATION WITH CORONARY ANGIOGRAM;  Surgeon: [name], MD;  Location: [facility] CATH LAB;  Service: Cardiovascular;  Laterality: N/A;  . Coronary angioplasty with stent placement  2002; 2003; 11/20/2014    "I have 4 stents after today" (11/20/2014)  . Cardiac catheterization  "several"  . Coronary angioplasty    . Left heart catheterization with coronary angiogram N/A 11/20/2014    Procedure: LEFT HEART CATHETERIZATION WITH CORONARY ANGIOGRAM;  Surgeon: [name], MD;  Location: [facility] CATH LAB;  Service: Cardiovascular;  Laterality: N/A;  . Percutaneous coronary rotoblator intervention (pci-r)  11/20/2014    Procedure: PERCUTANEOUS CORONARY ROTOBLATOR INTERVENTION (PCI-R);  Surgeon: [name], MD;  Location: [facility] CATH LAB;  Service: Cardiovascular;;   History reviewed. No pertinent family history. History  Substance Use Topics  . Smoking status: Never Smoker   . Smokeless tobacco: Never Used  . Alcohol Use: No    Review of Systems  Constitutional: Negative for fever, activity change and appetite change.  HENT: Negative for congestion and rhinorrhea.   Respiratory: Positive for chest tightness and shortness of breath. Negative for cough.   Cardiovascular: Positive for chest pain.  Gastrointestinal: Negative for nausea, vomiting and abdominal  pain.  Genitourinary: Negative for dysuria, hematuria and testicular pain.  Musculoskeletal: Positive for myalgias and arthralgias.  Skin: Negative for rash.  Neurological: Positive for dizziness and light-headedness. Negative for weakness.  A complete 10 system review of systems was obtained and all systems are negative except as noted in the HPI and PMH.      Allergies  Review of patient's allergies indicates no known allergies.  Home Medications   Prior to Admission medications   Medication Sig Start  Date End Date Taking? Authorizing Provider  albuterol (PROVENTIL HFA;VENTOLIN HFA) 108 (90 BASE) MCG/ACT inhaler Inhale 2 puffs into the lungs every 6 (six) hours as needed for wheezing or shortness of breath.   Yes Historical Provider, MD  amLODipine (NORVASC) 2.5 MG tablet Take 1 tablet (2.5 mg total) by mouth daily. Patient taking differently: Take 2.5 mg by mouth at bedtime.  11/16/14  Yes [name], MD  aspirin EC 81 MG tablet Take 81 mg by mouth at bedtime.    Yes Historical Provider, MD  citalopram (CELEXA) 20 MG tablet Take 20 mg by mouth at bedtime.    Yes Historical Provider, MD  clopidogrel (PLAVIX) 75 MG tablet Take 1 tablet (75 mg total) by mouth daily with breakfast. Patient taking differently: Take 75 mg by mouth at bedtime.  11/21/14  Yes Eileen Stanford, PA-C  fluticasone (FLONASE) 50 MCG/ACT nasal spray Place 1 spray into the nose 2 (two) times daily.    Yes Historical Provider, MD  Fluticasone-Salmeterol (ADVAIR) 250-50 MCG/DOSE AEPB Inhale 1 puff into the lungs every 12 (twelve) hours.   Yes Historical Provider, MD  gabapentin (NEURONTIN) 400 MG capsule Take 400 mg by mouth 3 (three) times daily.   Yes Historical Provider, MD  insulin aspart (NOVOLOG) 100 UNIT/ML injection Inject 2-30 Units into the skin 3 (three) times daily before meals.    Yes Historical Provider, MD  Insulin Glargine (LANTUS SOLOSTAR) 100 UNIT/ML Solostar Pen Inject 60 Units into the skin 2 (two) times daily.   Yes Historical Provider, MD  Melatonin 5 MG TABS Take 10 mg by mouth at bedtime.    Yes Historical Provider, MD  metFORMIN (GLUCOPHAGE) 1000 MG tablet Take 1 tablet (1,000 mg total) by mouth 2 (two) times daily. 11/22/14  Yes Eileen Stanford, PA-C  metoprolol tartrate (LOPRESSOR) 25 MG tablet Take 1 tablet (25 mg total) by mouth 2 (two) times daily. 11/21/14  Yes Eileen Stanford, PA-C  nitroGLYCERIN (NITROSTAT) 0.4 MG SL tablet Place 0.4 mg under the tongue every 5 (five) minutes as needed.    Yes Historical Provider, MD  omeprazole (PRILOSEC) 20 MG capsule Take 20 mg by mouth 2 (two) times daily.   Yes Historical Provider, MD  ramipril (ALTACE) 5 MG tablet Take 5 mg by mouth daily.   Yes Historical Provider, MD  gabapentin (NEURONTIN) 600 MG tablet Take 1 tablet (600 mg total) by mouth 3 (three) times daily. Patient not taking: Reported on 11/24/2014 08/18/13   Hulen Luster, DO  pantoprazole (PROTONIX) 40 MG tablet Take 1 tablet (40 mg total) by mouth daily. Patient not taking: Reported on 11/24/2014 11/21/14   Eileen Stanford, PA-C   BP 126/63 mmHg  Pulse 82  Temp(Src) 98.3 F (36.8 C) (Oral)  Resp 15  Ht 5' 9"  (1.753 m)  Wt 231 lb (104.781 kg)  BMI 34.10 kg/m2  SpO2 98% Physical Exam  Constitutional: He is oriented to person, place, and time. He appears well-developed and well-nourished. No  distress.  HENT:  Head: Normocephalic and atraumatic.  Mouth/Throat: Oropharynx is clear and moist. No oropharyngeal exudate.  Eyes: Conjunctivae and EOM are normal. Pupils are equal, round, and reactive to light.  Neck: Normal range of motion. Neck supple.  No meningismus.  Cardiovascular: Normal rate, regular rhythm, normal heart sounds and intact distal pulses.   No murmur heard. Pulmonary/Chest: Effort normal and breath sounds normal. No respiratory distress.  Abdominal: Soft. There is no tenderness. There is no rebound and no guarding.  Musculoskeletal: Normal range of motion. He exhibits no edema or tenderness.  Neurological: He is alert and oriented to person, place, and time. No cranial nerve deficit. He exhibits normal muscle tone. Coordination normal.  No ataxia on finger to nose bilaterally. No pronator drift. 5/5 strength throughout. CN 2-12 intact. Negative Romberg. Equal grip strength. Sensation intact. Gait is normal.   Skin: Skin is warm.  Psychiatric: He has a normal mood and affect. His behavior is normal.  Nursing note and vitals reviewed.   ED Course   Procedures (including critical care time) Labs Review Labs Reviewed  TROPONIN I - Abnormal; Notable for the following:    Troponin I 0.15 (*)    All other components within normal limits  CBC WITH DIFFERENTIAL - Abnormal; Notable for the following:    WBC 3.4 (*)    Platelets 120 (*)    All other components within normal limits  BASIC METABOLIC PANEL - Abnormal; Notable for the following:    Glucose, Bld 291 (*)    All other components within normal limits  HEPARIN LEVEL (UNFRACTIONATED)  HEPARIN LEVEL (UNFRACTIONATED)  CBC    Imaging Review Dg Chest Portable 1 View  11/24/2014   CLINICAL DATA:  Chest pain.  EXAM: PORTABLE CHEST - 1 VIEW  COMPARISON:  04/14/2014.  FINDINGS: The cardiac silhouette remains near the upper limit of normal in size. Clear lungs with normal vascularity. Unremarkable bones.  IMPRESSION: No acute abnormality.   Electronically Signed   By: Enrique Sack M.D.   On: 11/24/2014 16:17     EKG Interpretation   Date/Time:  Saturday November 24 2014 15:47:58 EST Ventricular Rate:  91 PR Interval:  135 QRS Duration: 79 QT Interval:  344 QTC Calculation: 423 R Axis:   34 Text Interpretation:  Sinus rhythm Borderline low voltage, extremity leads  No significant change was found Confirmed by Wyvonnia Dusky  MD, Archie Shea [phone])  on 11/24/2014 4:05:34 PM      MDM   Final diagnoses:  Chest pain   Intermittent left-sided chest pain for the past 2 days of shortness of breath and nausea. Recent catheterization with stent placement.  EKG without acute change. Patient given aspirin, nitroglycerin.  Start IV nitro drip, heparin for presumed unstable angina.  Catheterization on January 12 showed LAD instent restenosis with stent placement in LAD and OM. Final Conclusions:  1. 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 Normal LV function.   Troponin 0.15. Discussed with cardiology at [facility] who  accepts patient to step down bed. Dr. Tommi Rumps accepts.  CRITICAL CARE Performed by: Ezequiel Essex Total critical care time: 40 Critical care time was exclusive of separately billable procedures and treating other patients. Critical care was necessary to treat or prevent imminent or life-threatening deterioration. Critical care was time spent personally by me on the following activities: development of treatment plan with patient and/or surrogate as well as nursing, discussions  with consultants, evaluation of patient's response to treatment, examination of patient, obtaining history from patient or surrogate, ordering and performing treatments and interventions, ordering and review of laboratory studies, ordering and review of radiographic studies, pulse oximetry and re-evaluation of patient's condition.   Ezequiel Essex, MD 11/24/14 2259

## 2014-11-24 NOTE — Progress Notes (Signed)
PHARMACIST - PHYSICIAN ORDER COMMUNICATION  CONCERNING: P&T Medication Policy on Herbal Medications  DESCRIPTION:  This patient's order for:  Melatonin has been noted.  This product(s) is classified as an "herbal" or natural product. Due to a lack of definitive safety studies or FDA approval, nonstandard manufacturing practices, plus the potential risk of unknown drug-drug interactions while on inpatient medications, the Pharmacy and Therapeutics Committee does not permit the use of "herbal" or natural products of this type within [facility].   ACTION TAKEN: The pharmacy department is unable to verify this order at this time and your patient has been informed of this safety policy. Please reevaluate patient's clinical condition at discharge and address if the herbal or natural product(s) should be resumed at that time.  Dia Sitter, PharmD, BCPS

## 2014-11-25 LAB — HEPARIN LEVEL (UNFRACTIONATED)
Heparin Unfractionated: 0.32 IU/mL (ref 0.30–0.70)
Heparin Unfractionated: 0.4 IU/mL (ref 0.30–0.70)

## 2014-11-25 LAB — CBC
HCT: 37 % — ABNORMAL LOW (ref 39.0–52.0)
HEMOGLOBIN: 13.3 g/dL (ref 13.0–17.0)
MCH: 31.4 pg (ref 26.0–34.0)
MCHC: 35.9 g/dL (ref 30.0–36.0)
MCV: 87.3 fL (ref 78.0–100.0)
Platelets: 114 10*3/uL — ABNORMAL LOW (ref 150–400)
RBC: 4.24 MIL/uL (ref 4.22–5.81)
RDW: 12.8 % (ref 11.5–15.5)
WBC: 3.7 10*3/uL — ABNORMAL LOW (ref 4.0–10.5)

## 2014-11-25 LAB — GLUCOSE, CAPILLARY
GLUCOSE-CAPILLARY: 142 mg/dL — AB (ref 70–99)
Glucose-Capillary: 189 mg/dL — ABNORMAL HIGH (ref 70–99)
Glucose-Capillary: 254 mg/dL — ABNORMAL HIGH (ref 70–99)
Glucose-Capillary: 97 mg/dL (ref 70–99)

## 2014-11-25 LAB — BASIC METABOLIC PANEL
ANION GAP: 10 (ref 5–15)
BUN: 13 mg/dL (ref 6–23)
CALCIUM: 8.8 mg/dL (ref 8.4–10.5)
CO2: 26 mmol/L (ref 19–32)
Chloride: 103 mEq/L (ref 96–112)
Creatinine, Ser: 1.02 mg/dL (ref 0.50–1.35)
GFR calc Af Amer: >90 mL/min (ref >90)
GFR calc non Af Amer: 80 mL/min — ABNORMAL LOW (ref >90)
Glucose, Bld: 167 mg/dL — ABNORMAL HIGH (ref 70–99)
POTASSIUM: 4.1 mmol/L (ref 3.5–5.1)
Sodium: 139 mmol/L (ref 135–145)

## 2014-11-25 LAB — LIPID PANEL
CHOLESTEROL: 202 mg/dL — AB (ref 0–200)
HDL: 27 mg/dL — ABNORMAL LOW (ref >39)
LDL Cholesterol: UNDETERMINED mg/dL (ref 0–99)
TRIGLYCERIDES: 415 mg/dL — AB (ref <150)
Total CHOL/HDL Ratio: 7.5 RATIO
VLDL: UNDETERMINED mg/dL (ref 0–40)

## 2014-11-25 LAB — HEMOGLOBIN A1C
Hgb A1c MFr Bld: 8.9 % — ABNORMAL HIGH (ref <5.7)
Mean Plasma Glucose: 209 mg/dL — ABNORMAL HIGH (ref <117)

## 2014-11-25 LAB — TROPONIN I
TROPONIN I: 0.14 ng/mL — AB (ref <0.031)
Troponin I: 0.16 ng/mL — ABNORMAL HIGH (ref <0.031)

## 2014-11-25 MED ORDER — SODIUM CHLORIDE 0.9 % IJ SOLN
3.0000 mL | Freq: Two times a day (BID) | INTRAMUSCULAR | Status: DC
  Administered 2014-11-26: 3 mL via INTRAVENOUS

## 2014-11-25 MED ORDER — ASPIRIN 81 MG PO CHEW
81.0000 mg | CHEWABLE_TABLET | ORAL | Status: AC
  Administered 2014-11-26: 81 mg via ORAL
  Filled 2014-11-25: qty 1

## 2014-11-25 MED ORDER — SODIUM CHLORIDE 0.9 % IV SOLN
INTRAVENOUS | Status: DC
  Administered 2014-11-26 (×2): via INTRAVENOUS

## 2014-11-25 MED ORDER — SODIUM CHLORIDE 0.9 % IV SOLN: 250.0000 mL | INTRAVENOUS | Status: DC | PRN

## 2014-11-25 MED ORDER — SODIUM CHLORIDE 0.9 % IJ SOLN: 3.0000 mL | INTRAMUSCULAR | Status: DC | PRN

## 2014-11-25 NOTE — Progress Notes (Signed)
ANTICOAGULATION CONSULT NOTE - Follow Up Consult  Pharmacy Consult for Heparin Indication: chest pain/ACS  No Known Allergies  Patient Measurements: Height: 5' 9"  (175.3 cm) Weight: 231 lb 14.4 oz (105.189 kg) IBW/kg (Calculated) : 70.7 Heparin Dosing Weight: 93kg  Vital Signs: Temp: 98.8 F (37.1 C) (01/17 0800) Temp Source: Oral (01/17 0800) BP: 137/78 mmHg (01/17 0800) Pulse Rate: 72 (01/17 0800)  Labs:  Recent Labs  11/24/14 1605 11/24/14 2220 11/24/14 2344 11/25/14 0200 11/25/14 0740  HGB 14.1  --   --  13.3  --   HCT 39.5  --   --  37.0*  --   PLT 120*  --   --  114*  --   HEPARINUNFRC  --   --  0.32  --  0.40  CREATININE 0.90  --   --   --  1.02  TROPONINI 0.15* 0.16*  --  0.14* 0.16*    Estimated Creatinine Clearance: 96.7 mL/min (by C-G formula based on Cr of 1.02).   Medications:  Heparin @ 1200 units/hr  Assessment: 58yom continues on heparin for NSTEMI despite recent PCI to LAD/OM. Confirmatory heparin level is therapeutic. CBC is stable. No bleeding reported. Noted plan for cath tomorrow.   Goal of Therapy:  Heparin level 0.3-0.7 units/ml Monitor platelets by anticoagulation protocol: Yes   Plan:  1) Continue heparin at 1200 units/hr 2) Follow up heparin level and CBC in AM 3) Cath 1/18  Deboraha Sprang 11/25/2014,9:04 AM

## 2014-11-25 NOTE — Progress Notes (Signed)
ANTICOAGULATION CONSULT NOTE - Follow Up Consult  Pharmacy Consult for heparin Indication: chest pain/ACS  Labs:  Recent Labs  11/24/14 1605 11/24/14 2220 11/24/14 2344  HGB 14.1  --   --   HCT 39.5  --   --   PLT 120*  --   --   HEPARINUNFRC  --   --  0.32  CREATININE 0.90  --   --   TROPONINI 0.15* 0.16*  --      Assessment/Plan: 59yo male therapeutic on heparin with initial dosing for CP. Will continue gtt at current rate and confirm stable with additional level.    Wynona Neat, PharmD, BCPS  11/25/2014,12:28 AM

## 2014-11-25 NOTE — Progress Notes (Signed)
Subjective:   HPI:   He reported that after leaving [facility] on 11/21/14, he did begin to feel a slight "sharp pain" in his chest. On Thursday he additionally felt a pressure sensation and the pain grew to 8/10 in severity. Pain was worse with exertion and was improved with rest and SL NTG. He reported associated nausea, dyspnea, and sweats. On Friday, the pain also felt like an ache at times and radiated to his shoulder and arm. He states the pain is the same quality of pain that he had prior to his PCI, but it is less severe now. Denied missing any DAPT doses since discharge. Due to recurrent symptoms, the patient's wife forced him to be evaluated the day of admission and brought [facility] to the [facility].   Feels better, but NTG HA. No CP.   Objective:  Vital Signs in the last 24 hours: Temp:  [97.8 F (36.6 C)-98.8 F (37.1 C)] 98.8 F (37.1 C) (01/17 0800) Pulse Rate:  [70-88] 72 (01/17 0800) Resp:  [12-18] 18 (01/17 0800) BP: (104-137)/(60-80) 137/78 mmHg (01/17 0800) SpO2:  [93 %-99 %] 99 % (01/17 0855) FiO2 (%):  [0 %] 0 % (01/16 1950) Weight:  [231 lb (104.781 kg)-232 lb 8 oz (105.461 kg)] 231 lb 14.4 oz (105.189 kg) (01/17 0416)  Intake/Output from previous day: 01/16 0701 - 01/17 0700 In: 579.3 [P.O.:360; I.V.:219.3] Out: 525 [Urine:525]   Physical Exam: General: Well developed, well nourished, in no acute distress. Head:  Normocephalic and atraumatic. Lungs: Clear to auscultation and percussion. Heart: Normal S1 and S2.  No murmur, rubs or gallops.  Abdomen: soft, non-tender, positive bowel sounds. Extremities: No clubbing or cyanosis. No edema. 2+radial pulse at prior cath site Neurologic: Alert and oriented x 3.    Lab Results:  Recent Labs  11/24/14 1605 11/25/14 0200  WBC 3.4* 3.7*  HGB 14.1 13.3  PLT 120* 114*    Recent Labs  11/24/14 1605 11/25/14 0740  NA 136 139  K 4.1 4.1  CL 105 103  CO2 23 26  GLUCOSE 291* 167*  BUN 16 13  CREATININE 0.90 1.02      Recent Labs  11/25/14 0200 11/25/14 0740  TROPONINI 0.14* 0.16*    Imaging: Dg Chest Portable 1 View  11/24/2014   CLINICAL DATA:  Chest pain.  EXAM: PORTABLE CHEST - 1 VIEW  COMPARISON:  04/14/2014.  FINDINGS: The cardiac silhouette remains near the upper limit of normal in size. Clear lungs with normal vascularity. Unremarkable bones.  IMPRESSION: No acute abnormality.   Electronically Signed   By: Enrique Sack M.D.   On: 11/24/2014 16:17   Personally viewed.   Telemetry: No adverse rhythms Personally viewed.   EKG:  NSR non specific ST flattening    Assessment/Plan:  Active Problems:   Unstable angina   NSTEMI (non-ST elevated myocardial infarction)    [name] [name] is a 59 y.o. male with a history of CAD s/p LAD/OM stenting, HTN, TIA, HLD, OSA on CPAP, and DM who is readmitted with an NSTEMI in the setting of uncomplicated elective PCI of the pLAD (ISR) and OM1. Possible that the positive troponin is related to a periprocedural MI (Type 4a). Doubt stent thrombosis since Trop elevation is flat and mild.   NSTEMI  - Cath Monday with Dr. [name]. With symptoms similar to that prior to PCI, ? If there may be issue.   - Will wean down NTG gtt (HA)  - IV hep.   -  ASA, Plavix, metop, amlodipine  - statin intolerance  DM  - holding metformin  CAD  - AS above  Obesity  - weight loss.   Ferris Fielden, [place] 11/25/2014, 11:46 AM

## 2014-11-25 NOTE — Progress Notes (Signed)
Utilization review completed.  

## 2014-11-26 ENCOUNTER — Encounter (HOSPITAL_COMMUNITY)
Admission: EM | Disposition: A | Discharge status: Home / Self Care | Payer: Self-pay | Source: Home / Self Care | Attending: Cardiology

## 2014-11-26 DIAGNOSIS — I359 Nonrheumatic aortic valve disorder, unspecified: Secondary | ICD-10-CM

## 2014-11-26 DIAGNOSIS — I2510 Atherosclerotic heart disease of native coronary artery without angina pectoris: Secondary | ICD-10-CM

## 2014-11-26 DIAGNOSIS — I200 Unstable angina: Secondary | ICD-10-CM

## 2014-11-26 HISTORY — PX: LEFT HEART CATHETERIZATION WITH CORONARY ANGIOGRAM: SHX5451

## 2014-11-26 LAB — CBC
HCT: 38.7 % — ABNORMAL LOW (ref 39.0–52.0)
Hemoglobin: 13.7 g/dL (ref 13.0–17.0)
MCH: 31.6 pg (ref 26.0–34.0)
MCHC: 35.4 g/dL (ref 30.0–36.0)
MCV: 89.4 fL (ref 78.0–100.0)
Platelets: 106 10*3/uL — ABNORMAL LOW (ref 150–400)
RBC: 4.33 MIL/uL (ref 4.22–5.81)
RDW: 13 % (ref 11.5–15.5)
WBC: 3.2 10*3/uL — ABNORMAL LOW (ref 4.0–10.5)

## 2014-11-26 LAB — PLATELET INHIBITION P2Y12: Platelet Function  P2Y12: 194 [PRU] (ref 194–418)

## 2014-11-26 LAB — HEPARIN LEVEL (UNFRACTIONATED): Heparin Unfractionated: 0.52 IU/mL (ref 0.30–0.70)

## 2014-11-26 LAB — GLUCOSE, CAPILLARY
GLUCOSE-CAPILLARY: 105 mg/dL — AB (ref 70–99)
GLUCOSE-CAPILLARY: 121 mg/dL — AB (ref 70–99)
Glucose-Capillary: 112 mg/dL — ABNORMAL HIGH (ref 70–99)
Glucose-Capillary: 139 mg/dL — ABNORMAL HIGH (ref 70–99)

## 2014-11-26 SURGERY — LEFT HEART CATHETERIZATION WITH CORONARY ANGIOGRAM
Anesthesia: LOCAL

## 2014-11-26 MED ORDER — SODIUM CHLORIDE 0.9 % IV SOLN
1.0000 mL/kg/h | INTRAVENOUS | Status: AC
  Administered 2014-11-26 (×2): 1 mL/kg/h via INTRAVENOUS

## 2014-11-26 MED ORDER — LIDOCAINE HCL (PF) 1 % IJ SOLN
INTRAMUSCULAR | Status: AC
  Filled 2014-11-26: qty 30

## 2014-11-26 MED ORDER — HEPARIN SODIUM (PORCINE) 1000 UNIT/ML IJ SOLN
INTRAMUSCULAR | Status: AC
  Filled 2014-11-26: qty 1

## 2014-11-26 MED ORDER — NITROGLYCERIN 1 MG/10 ML FOR IR/CATH LAB
INTRA_ARTERIAL | Status: AC
  Filled 2014-11-26: qty 10

## 2014-11-26 MED ORDER — MIDAZOLAM HCL 2 MG/2ML IJ SOLN
INTRAMUSCULAR | Status: AC
  Filled 2014-11-26: qty 2

## 2014-11-26 MED ORDER — VERAPAMIL HCL 2.5 MG/ML IV SOLN
INTRAVENOUS | Status: AC
  Filled 2014-11-26: qty 2

## 2014-11-26 MED ORDER — HEPARIN SODIUM (PORCINE) 5000 UNIT/ML IJ SOLN: 5000.0000 [IU] | Freq: Three times a day (TID) | INTRAMUSCULAR | Status: DC

## 2014-11-26 MED ORDER — PROMETHAZINE HCL 25 MG PO TABS
12.5000 mg | ORAL_TABLET | Freq: Once | ORAL | Status: AC
  Administered 2014-11-26: 12.5 mg via ORAL
  Filled 2014-11-26: qty 1

## 2014-11-26 MED ORDER — ADENOSINE 12 MG/4ML IV SOLN
16.0000 mL | Freq: Once | INTRAVENOUS | Status: DC
  Filled 2014-11-26: qty 16

## 2014-11-26 MED ORDER — HEPARIN (PORCINE) IN NACL 2-0.9 UNIT/ML-% IJ SOLN
INTRAMUSCULAR | Status: AC
  Filled 2014-11-26: qty 1000

## 2014-11-26 MED ORDER — ISOSORBIDE MONONITRATE ER 30 MG PO TB24
30.0000 mg | ORAL_TABLET | Freq: Every day | ORAL | Status: DC
  Administered 2014-11-26: 30 mg via ORAL
  Filled 2014-11-26 (×2): qty 1

## 2014-11-26 MED ORDER — FENTANYL CITRATE 0.05 MG/ML IJ SOLN
INTRAMUSCULAR | Status: AC
  Filled 2014-11-26: qty 2

## 2014-11-26 NOTE — Interval H&P Note (Signed)
History and Physical Interval Note:  11/26/2014 3:36 PM  [name] [name]  has presented today for surgery, with the diagnosis of unstable angina  The various methods of treatment have been discussed with the patient and family. After consideration of risks, benefits and other options for treatment, the patient has consented to  Procedure(s): LEFT HEART CATHETERIZATION WITH CORONARY ANGIOGRAM (N/A) as a surgical intervention .  The patient's history has been reviewed, patient examined, no change in status, stable for surgery.  I have reviewed the patient's chart and labs.  Questions were answered to the patient's satisfaction.    Cath Lab Visit (complete for each Cath Lab visit)  Clinical Evaluation Leading to the Procedure:   ACS: Yes.    Non-ACS:    Anginal Classification: CCS IV  Anti-ischemic medical therapy: Maximal Therapy (2 or more classes of medications)  Non-Invasive Test Results: No non-invasive testing performed  Prior CABG: No previous CABG       [name] [facility] 11/26/2014 3:36 PM

## 2014-11-26 NOTE — H&P (View-Only) (Signed)
Patient Profile: 59 y.o. male with a history of CAD s/p LAD/OM stenting, HTN, TIA, HLD, OSA on CPAP and DM who was recently admitted to [facility] on 11/20/14 for planned coronary angiography. LHC revealed severe 2 vessel obstructive CAD with instent restenosis of the proximal LAD and de novo lesion in the first OM. S/p PCI w/ DES to pLAD and DES to OM1 by Dr. [name]. Discharged home 1/13 on DAPT. Readmitted 1/16 with recurrent CP similar to pre PCI angina. Enzymes positive x 3.   Subjective: No complaints. Resting comfortably. Denies CP and dyspnea.   Objective: Vital signs in last 24 hours: Temp:  [98 F (36.7 C)-98.6 F (37 C)] 98.2 F (36.8 C) (01/18 0833) Pulse Rate:  [65-78] 70 (01/18 0700) Resp:  [0-18] 11 (01/18 0700) BP: (95-128)/(39-71) 95/39 mmHg (01/18 0700) SpO2:  [95 %-100 %] 98 % (01/18 0844) Weight:  [230 lb 12.6 oz (104.685 kg)] 230 lb 12.6 oz (104.685 kg) (01/18 0400) Last BM Date: 11/25/14  Intake/Output from previous day: 01/17 0701 - 01/18 0700 In: 900 [P.O.:900] Out: 2475 [Urine:2475] Intake/Output this shift:    Medications Current Facility-Administered Medications  Medication Dose Route Frequency Provider Last Rate Last Dose  . 0.9 %  sodium chloride infusion  250 mL Intravenous PRN Candee Furbish, MD      . 0.9 %  sodium chloride infusion   Intravenous Continuous Candee Furbish, MD 75 mL/hr at 11/26/14 0423    . acetaminophen (TYLENOL) tablet 650 mg  650 mg Oral Q4H PRN Lamar Sprinkles, MD   650 mg at 11/25/14 0826  . albuterol (PROVENTIL) (2.5 MG/3ML) 0.083% nebulizer solution 2.5 mg  2.5 mg Nebulization Q6H PRN Ezequiel Essex, MD      . amLODipine (NORVASC) tablet 2.5 mg  2.5 mg Oral QHS Lamar Sprinkles, MD   2.5 mg at 11/25/14 2234  . aspirin EC tablet 81 mg  81 mg Oral Daily Lamar Sprinkles, MD   81 mg at 11/25/14 1025  . citalopram (CELEXA) tablet 20 mg  20 mg Oral QHS Lamar Sprinkles, MD   20 mg at 11/25/14 2234  . clopidogrel (PLAVIX) tablet 75 mg  75 mg Oral  QHS Lamar Sprinkles, MD   75 mg at 11/25/14 2234  . fluticasone (FLONASE) 50 MCG/ACT nasal spray 1 spray  1 spray Each Nare BID Lamar Sprinkles, MD   1 spray at 11/25/14 2235  . gabapentin (NEURONTIN) capsule 400 mg  400 mg Oral TID Lamar Sprinkles, MD   400 mg at 11/25/14 2234  . heparin ADULT infusion 100 units/mL (25000 units/250 mL)  1,200 Units/hr Intravenous Continuous Ezequiel Essex, MD 12 mL/hr at 11/25/14 1230 1,200 Units/hr at 11/25/14 1230  . insulin aspart (novoLOG) injection 0-20 Units  0-20 Units Subcutaneous TID WC Lamar Sprinkles, MD   11 Units at 11/25/14 1755  . insulin glargine (LANTUS) injection 60 Units  60 Units Subcutaneous BID Ezequiel Essex, MD   30 Units at 11/25/14 2243  . metoprolol tartrate (LOPRESSOR) tablet 25 mg  25 mg Oral BID Lamar Sprinkles, MD   25 mg at 11/25/14 2234  . mometasone-formoterol (DULERA) 100-5 MCG/ACT inhaler 2 puff  2 puff Inhalation BID Lamar Sprinkles, MD   2 puff at 11/26/14 0844  . morphine 2 MG/ML injection 2-4 mg  2-4 mg Intravenous Q3H PRN Lamar Sprinkles, MD   2 mg at 11/25/14 2034  . nitroGLYCERIN (NITROSTAT) SL tablet 0.4 mg  0.4 mg Sublingual Q5 Min x 3 PRN Lamar Sprinkles, MD      .  nitroGLYCERIN 50 mg in dextrose 5 % 250 mL (0.2 mg/mL) infusion  5-200 mcg/min Intravenous Continuous Lamar Sprinkles, MD 6 mL/hr at 11/24/14 2030 20 mcg/min at 11/24/14 2030  . ondansetron (ZOFRAN) injection 4 mg  4 mg Intravenous Q6H PRN Lamar Sprinkles, MD   4 mg at 11/24/14 2211  . pantoprazole (PROTONIX) EC tablet 40 mg  40 mg Oral Daily Lamar Sprinkles, MD   40 mg at 11/25/14 1025  . ramipril (ALTACE) capsule 5 mg  5 mg Oral Daily Ezequiel Essex, MD   5 mg at 11/25/14 1025  . sodium chloride 0.9 % injection 3 mL  3 mL Intravenous Q12H Candee Furbish, MD   0 mL at 11/25/14 2331  . sodium chloride 0.9 % injection 3 mL  3 mL Intravenous PRN Candee Furbish, MD        PE: General appearance: alert, cooperative and no distress Neck: no JVD Lungs: clear to  auscultation bilaterally Heart: regular rate and rhythm Extremities: no LEE Pulses: 2+ and symmetric Skin: warm and dry Neurologic: Grossly normal  Lab Results:   Recent Labs  11/24/14 1605 11/25/14 0200 11/26/14 0537  WBC 3.4* 3.7* 3.2*  HGB 14.1 13.3 13.7  HCT 39.5 37.0* 38.7*  PLT 120* 114* 106*   BMET  Recent Labs  11/24/14 1605 11/25/14 0740  NA 136 139  K 4.1 4.1  CL 105 103  CO2 23 26  GLUCOSE 291* 167*  BUN 16 13  CREATININE 0.90 1.02  CALCIUM 9.0 8.8   PT/INR No results for input(s): LABPROT, INR in the last 72 hours. Cholesterol  Recent Labs  11/25/14 0200  CHOL 202*   Cardiac Panel (last 3 results)  Recent Labs  11/24/14 2220 11/25/14 0200 11/25/14 0740  TROPONINI 0.16* 0.14* 0.16*    Assessment/Plan  Active Problems:   Unstable angina   NSTEMI (non-ST elevated myocardial infarction)  NSTEMI: currently CP free. Plan for relook LHC to assess stent patency. Continue IV heparin, ASA, Plavix, BB, statin and ACE. Metformin is on hold.     LOS: 2 days    Brittainy M. Ladoris Gene 11/26/2014 9:31 AM  History and all data above reviewed.  Patient examined.  I agree with the findings as above.  The patient exam reveals COR:RRR  ,  Lungs: Clear  ,  Abd: Positive bowel sounds, no rebound no guarding, Ext No edema  .  All available labs, radiology testing, previous records reviewed. Agree with documented assessment and plan. Chest pain.  No pain today.  For cath this afternoon.  Questions answered.  The patient understands that risks included but are not limited to stroke (1 in 1000), death (1 in 67), kidney failure [usually temporary] (1 in 500), bleeding (1 in 200), allergic reaction [possibly serious] (1 in 200).  The patient understands and agrees to proceed.   Jeneen Rinks Meda Dudzinski  1:07 PM  11/26/2014

## 2014-11-26 NOTE — CV Procedure (Addendum)
    Cardiac Catheterization Procedure Note  Name: [name] [name] MRN: [id number] DOB: 18-Jun-1956  Procedure:  Selective Coronary Angiography  Indication: 59 yo WM with history of CAD s/p recent stent of the proximal LAD for stent restenosis and stent of the first OM for de novo lesion presents with recurrent chest pain and mildly elevated troponin.   Procedural Details: The right wrist was prepped, draped, and anesthetized with 1% lidocaine. Using the modified Seldinger technique, a 6 French slender sheath was introduced into the right radial artery. 3 mg of verapamil was administered through the sheath, weight-based unfractionated heparin was administered intravenously. Standard Judkins catheters were used for selective coronary angiography. Catheter exchanges were performed over an exchange length guidewire. There were no immediate procedural complications. A TR band was used for radial hemostasis at the completion of the procedure.  The patient was transferred to the post catheterization recovery area for further monitoring.  Procedural Findings: Hemodynamics: AO 95/63 mean 78 mm Hg   Coronary angiography: Coronary dominance: right  Left mainstem: Normal  Left anterior descending (LAD): There is a long segment of stent in the proximal to mid LAD. The site of repeat stenting in the proximal vessel is widely patent. The mid LAD segment is patent with less than 30% disease. In the LAD distal to the stent there is a long segment of disease up to 50%. All side branches are still patent.   Left circumflex (LCx): The LCx gives rise to a single large branching OM. The larger branch is stented and the stent is widely patent. The small branch has a 40% ostial stenosis. The distal LCx is without significant disease.  Right coronary artery (RCA): There are mild irregularities.   Left ventriculography: not done.  Final Conclusions:   1. Nonobstructive CAD. The prior stents are patent and all side  branches are patent. The LAD distal to the stents is diffusely diseased up to 50% but is unchanged from prior angiography. The vessel at this segment is small in diameter and would be difficult to stent. Films reviewed with Dr. Burt Knack.  Recommendations: Continue medical therapy. Will check P2Y12 assay to make sure he is responding to Plavix. If not would switch to Brilinta. While he does have a mildly increased troponin there is a flat curve that is atypical.   [name], [facility]  11/26/2014, 4:27 PM

## 2014-11-26 NOTE — Progress Notes (Signed)
Pt only received half of his lantus last night d/t pt being npo for a procedure the next day at an unknown time. Pt's cbg was 97. Hoover Brunette, RN

## 2014-11-26 NOTE — Progress Notes (Signed)
  Echocardiogram 2D Echocardiogram has been performed.  [name] [name] 11/26/2014, 11:22 AM

## 2014-11-26 NOTE — Progress Notes (Signed)
Patient Profile: 59 y.o. male with a history of CAD s/p LAD/OM stenting, HTN, TIA, HLD, OSA on CPAP and DM who was recently admitted to [facility] on 11/20/14 for planned coronary angiography. LHC revealed severe 2 vessel obstructive CAD with instent restenosis of the proximal LAD and de novo lesion in the first OM. S/p PCI w/ DES to pLAD and DES to OM1 by Dr. [name]. Discharged home 1/13 on DAPT. Readmitted 1/16 with recurrent CP similar to pre PCI angina. Enzymes positive x 3.   Subjective: No complaints. Resting comfortably. Denies CP and dyspnea.   Objective: Vital signs in last 24 hours: Temp:  [98 F (36.7 C)-98.6 F (37 C)] 98.2 F (36.8 C) (01/18 0833) Pulse Rate:  [65-78] 70 (01/18 0700) Resp:  [0-18] 11 (01/18 0700) BP: (95-128)/(39-71) 95/39 mmHg (01/18 0700) SpO2:  [95 %-100 %] 98 % (01/18 0844) Weight:  [230 lb 12.6 oz (104.685 kg)] 230 lb 12.6 oz (104.685 kg) (01/18 0400) Last BM Date: 11/25/14  Intake/Output from previous day: 01/17 0701 - 01/18 0700 In: 900 [P.O.:900] Out: 2475 [Urine:2475] Intake/Output this shift:    Medications Current Facility-Administered Medications  Medication Dose Route Frequency Provider Last Rate Last Dose  . 0.9 %  sodium chloride infusion  250 mL Intravenous PRN Candee Furbish, MD      . 0.9 %  sodium chloride infusion   Intravenous Continuous Candee Furbish, MD 75 mL/hr at 11/26/14 0423    . acetaminophen (TYLENOL) tablet 650 mg  650 mg Oral Q4H PRN Lamar Sprinkles, MD   650 mg at 11/25/14 0826  . albuterol (PROVENTIL) (2.5 MG/3ML) 0.083% nebulizer solution 2.5 mg  2.5 mg Nebulization Q6H PRN Ezequiel Essex, MD      . amLODipine (NORVASC) tablet 2.5 mg  2.5 mg Oral QHS Lamar Sprinkles, MD   2.5 mg at 11/25/14 2234  . aspirin EC tablet 81 mg  81 mg Oral Daily Lamar Sprinkles, MD   81 mg at 11/25/14 1025  . citalopram (CELEXA) tablet 20 mg  20 mg Oral QHS Lamar Sprinkles, MD   20 mg at 11/25/14 2234  . clopidogrel (PLAVIX) tablet 75 mg  75 mg Oral  QHS Lamar Sprinkles, MD   75 mg at 11/25/14 2234  . fluticasone (FLONASE) 50 MCG/ACT nasal spray 1 spray  1 spray Each Nare BID Lamar Sprinkles, MD   1 spray at 11/25/14 2235  . gabapentin (NEURONTIN) capsule 400 mg  400 mg Oral TID Lamar Sprinkles, MD   400 mg at 11/25/14 2234  . heparin ADULT infusion 100 units/mL (25000 units/250 mL)  1,200 Units/hr Intravenous Continuous Ezequiel Essex, MD 12 mL/hr at 11/25/14 1230 1,200 Units/hr at 11/25/14 1230  . insulin aspart (novoLOG) injection 0-20 Units  0-20 Units Subcutaneous TID WC Lamar Sprinkles, MD   11 Units at 11/25/14 1755  . insulin glargine (LANTUS) injection 60 Units  60 Units Subcutaneous BID Ezequiel Essex, MD   30 Units at 11/25/14 2243  . metoprolol tartrate (LOPRESSOR) tablet 25 mg  25 mg Oral BID Lamar Sprinkles, MD   25 mg at 11/25/14 2234  . mometasone-formoterol (DULERA) 100-5 MCG/ACT inhaler 2 puff  2 puff Inhalation BID Lamar Sprinkles, MD   2 puff at 11/26/14 0844  . morphine 2 MG/ML injection 2-4 mg  2-4 mg Intravenous Q3H PRN Lamar Sprinkles, MD   2 mg at 11/25/14 2034  . nitroGLYCERIN (NITROSTAT) SL tablet 0.4 mg  0.4 mg Sublingual Q5 Min x 3 PRN Lamar Sprinkles, MD      .  nitroGLYCERIN 50 mg in dextrose 5 % 250 mL (0.2 mg/mL) infusion  5-200 mcg/min Intravenous Continuous Lamar Sprinkles, MD 6 mL/hr at 11/24/14 2030 20 mcg/min at 11/24/14 2030  . ondansetron (ZOFRAN) injection 4 mg  4 mg Intravenous Q6H PRN Lamar Sprinkles, MD   4 mg at 11/24/14 2211  . pantoprazole (PROTONIX) EC tablet 40 mg  40 mg Oral Daily Lamar Sprinkles, MD   40 mg at 11/25/14 1025  . ramipril (ALTACE) capsule 5 mg  5 mg Oral Daily Ezequiel Essex, MD   5 mg at 11/25/14 1025  . sodium chloride 0.9 % injection 3 mL  3 mL Intravenous Q12H Candee Furbish, MD   0 mL at 11/25/14 2331  . sodium chloride 0.9 % injection 3 mL  3 mL Intravenous PRN Candee Furbish, MD        PE: General appearance: alert, cooperative and no distress Neck: no JVD Lungs: clear to  auscultation bilaterally Heart: regular rate and rhythm Extremities: no LEE Pulses: 2+ and symmetric Skin: warm and dry Neurologic: Grossly normal  Lab Results:   Recent Labs  11/24/14 1605 11/25/14 0200 11/26/14 0537  WBC 3.4* 3.7* 3.2*  HGB 14.1 13.3 13.7  HCT 39.5 37.0* 38.7*  PLT 120* 114* 106*   BMET  Recent Labs  11/24/14 1605 11/25/14 0740  NA 136 139  K 4.1 4.1  CL 105 103  CO2 23 26  GLUCOSE 291* 167*  BUN 16 13  CREATININE 0.90 1.02  CALCIUM 9.0 8.8   PT/INR No results for input(s): LABPROT, INR in the last 72 hours. Cholesterol  Recent Labs  11/25/14 0200  CHOL 202*   Cardiac Panel (last 3 results)  Recent Labs  11/24/14 2220 11/25/14 0200 11/25/14 0740  TROPONINI 0.16* 0.14* 0.16*    Assessment/Plan  Active Problems:   Unstable angina   NSTEMI (non-ST elevated myocardial infarction)  NSTEMI: currently CP free. Plan for relook LHC to assess stent patency. Continue IV heparin, ASA, Plavix, BB, statin and ACE. Metformin is on hold.     LOS: 2 days    Brittainy M. Ladoris Gene 11/26/2014 9:31 AM  History and all data above reviewed.  Patient examined.  I agree with the findings as above.  The patient exam reveals COR:RRR  ,  Lungs: Clear  ,  Abd: Positive bowel sounds, no rebound no guarding, Ext No edema  .  All available labs, radiology testing, previous records reviewed. Agree with documented assessment and plan. Chest pain.  No pain today.  For cath this afternoon.  Questions answered.  The patient understands that risks included but are not limited to stroke (1 in 1000), death (1 in 58), kidney failure [usually temporary] (1 in 500), bleeding (1 in 200), allergic reaction [possibly serious] (1 in 200).  The patient understands and agrees to proceed.   Jeneen Rinks Horatio Bertz  1:07 PM  11/26/2014

## 2014-11-26 NOTE — Progress Notes (Signed)
ANTICOAGULATION CONSULT NOTE - Follow Up Consult  Pharmacy Consult for Heparin Indication: chest pain/ACS  No Known Allergies  Patient Measurements: Height: 5' 9"  (175.3 cm) Weight: 230 lb 12.6 oz (104.685 kg) IBW/kg (Calculated) : 70.7 Heparin Dosing Weight: 93kg  Vital Signs: Temp: 98.2 F (36.8 C) (01/18 0833) Temp Source: Oral (01/18 0833) BP: 113/66 mmHg (01/18 0958) Pulse Rate: 69 (01/18 0958)  Labs:  Recent Labs  11/24/14 1605 11/24/14 2220 11/24/14 2344 11/25/14 0200 11/25/14 0740 11/26/14 0537  HGB 14.1  --   --  13.3  --  13.7  HCT 39.5  --   --  37.0*  --  38.7*  PLT 120*  --   --  114*  --  106*  HEPARINUNFRC  --   --  0.32  --  0.40 0.52  CREATININE 0.90  --   --   --  1.02  --   TROPONINI 0.15* 0.16*  --  0.14* 0.16*  --     Estimated Creatinine Clearance: 96.4 mL/min (by C-G formula based on Cr of 1.02).   Medications:  . sodium chloride 75 mL/hr at 11/26/14 0423  . heparin 1,200 Units/hr (11/25/14 1230)  . nitroGLYCERIN 20 mcg/min (11/24/14 2030)    Assessment: 55 yom continues on heparin for NSTEMI despite recent PCI to LAD/OM. Heparin level is therapeutic this morning. CBC is stable, platelets are low but low on admit. No bleeding reported. Noted plan for cath today.   Goal of Therapy:  Heparin level 0.3-0.7 units/ml Monitor platelets by anticoagulation protocol: Yes   Plan:  1) Continue heparin drip  at 1200 units/hr 2) Heparin level and CBC in AM 3) F/U after cath  [facility], Pharm.D., BCPS Clinical Pharmacist Pager: [phone] 11/26/2014 10:27 AM

## 2014-11-27 ENCOUNTER — Encounter (HOSPITAL_COMMUNITY): Payer: Self-pay | Admitting: Cardiology

## 2014-11-27 DIAGNOSIS — R079 Chest pain, unspecified: Secondary | ICD-10-CM

## 2014-11-27 LAB — GLUCOSE, CAPILLARY
GLUCOSE-CAPILLARY: 99 mg/dL (ref 70–99)
Glucose-Capillary: 179 mg/dL — ABNORMAL HIGH (ref 70–99)

## 2014-11-27 LAB — CBC
HCT: 36.9 % — ABNORMAL LOW (ref 39.0–52.0)
Hemoglobin: 13.2 g/dL (ref 13.0–17.0)
MCH: 31.6 pg (ref 26.0–34.0)
MCHC: 35.8 g/dL (ref 30.0–36.0)
MCV: 88.3 fL (ref 78.0–100.0)
Platelets: 99 10*3/uL — ABNORMAL LOW (ref 150–400)
RBC: 4.18 MIL/uL — AB (ref 4.22–5.81)
RDW: 13 % (ref 11.5–15.5)
WBC: 3.1 10*3/uL — AB (ref 4.0–10.5)

## 2014-11-27 LAB — BASIC METABOLIC PANEL
Anion gap: 4 — ABNORMAL LOW (ref 5–15)
BUN: 11 mg/dL (ref 6–23)
CALCIUM: 9.2 mg/dL (ref 8.4–10.5)
CO2: 31 mmol/L (ref 19–32)
CREATININE: 1.02 mg/dL (ref 0.50–1.35)
Chloride: 104 mEq/L (ref 96–112)
GFR calc Af Amer: >90 mL/min (ref >90)
GFR, EST NON AFRICAN AMERICAN: 80 mL/min — AB (ref >90)
Glucose, Bld: 149 mg/dL — ABNORMAL HIGH (ref 70–99)
Potassium: 3.8 mmol/L (ref 3.5–5.1)
SODIUM: 139 mmol/L (ref 135–145)

## 2014-11-27 MED ORDER — METFORMIN HCL 1000 MG PO TABS: 1000.0000 mg | ORAL_TABLET | Freq: Two times a day (BID) | ORAL | Status: DC

## 2014-11-27 MED ORDER — PANTOPRAZOLE SODIUM 40 MG PO TBEC: 40.0000 mg | DELAYED_RELEASE_TABLET | Freq: Every day | ORAL | Status: DC

## 2014-11-27 NOTE — Discharge Summary (Signed)
Discharge Summary   Patient ID: [name] [name] MRN: [id number], DOB/AGE: Jan 27, 1956 59 y.o. Admit date: 11/24/2014 D/C date:     11/27/2014  Primary Cardiologist: Dr. Domenic Polite  Principal Problem:   Chest pain Active Problems:   Mixed hyperlipidemia   Essential hypertension, benign   Obstructive sleep apnea   PVC's (premature ventricular contractions)   Type 2 diabetes mellitus   TIA (transient ischemic attack)   NSTEMI (non-ST elevated myocardial infarction)    Admission Dates: 11/24/14-11/27/14 Discharge Diagnosis: chest pain s/p LHC with patent stents  HPI: [name] [name] is a 59 y.o. male with a history of CAD s/p LAD/OM stenting, HTN, TIA, HLD, OSA on CPAP and DM who was recently admitted to [facility] on 11/20/14 for planned coronary angiography. LHC revealed severe 2 vessel obstructive CAD with instent restenosis of the proximal LAD and de novo lesion in the first OM. S/p PCI w/ DES to pLAD and DES to OM1 by Dr. [name]. Discharged home 11/21/14 on DAPT. He was then readmitted 11/24/14 with recurrent CP similar to pre PCI angina. Enzymes slightly positive.    Hospital Course  NSTEMI: While he does have a mildly increased troponin there is a flat curve that is atypical. ( 0.15--> 0.16--> 0.14) -- S/p LHC 11/26/14 which revealed " Nonobstructive CAD. The prior stents are patent and all side branches are patent. The LAD distal to the stents is diffusely diseased up to 50% but is unchanged from prior angiography. The vessel at this segment is small in diameter and would be difficult to stent. Films reviewed with Dr. Burt Knack." -- P2Y12 checked and reveals adequate platelet inhibition. -- Continue ASA, Plavix, BB, statin and ACE. -- Started on imdur 90m and not tolerating this well, so this was discontinued.   HTN- well controlled on amlodipine 2.545m lopressor 2579mID and ramipril 5mg52mHLD- history of statin intolerance, currently on TriCor. -- Will consider enrolling in SPIRE  trial.  DM- Hold metformin >48 hours post cardiac cath.  Thrombocytopenia- chronic. Probably not a good candidate for DAPT for life. Will continue for minimum of 1 year  Hx of PVCs- continue BB   GERD- changed on prilosec to protonix now that he is on plavix    The patient has had an uncomplicated hospital course and is recovering well. The radial catheter site is stable. He has been seen by Dr. HochPercival Spanishay and deemed ready for discharge home. All follow-up appointments have been scheduled. Discharge medications are listed below.   Discharge Vitals: Blood pressure 114/61, pulse 62, temperature 98.3 F (36.8 C), temperature source Oral, resp. rate 20, height 5' 9"  (1.753 m), weight 231 lb (104.781 kg), SpO2 98 %.  Labs: Lab Results  Component Value Date   WBC 3.1* 11/27/2014   HGB 13.2 11/27/2014   HCT 36.9* 11/27/2014   MCV 88.3 11/27/2014   PLT 99* 11/27/2014     Recent Labs Lab 11/27/14 1050  NA 139  K 3.8  CL 104  CO2 31  BUN 11  CREATININE 1.02  CALCIUM 9.2  GLUCOSE 149*    Recent Labs  11/24/14 1605 11/24/14 2220 11/25/14 0200 11/25/14 0740  TROPONINI 0.15* 0.16* 0.14* 0.16*   Lab Results  Component Value Date   CHOL 202* 11/25/2014   HDL 27* 11/25/2014   LDLCALC UNABLE TO CALCULATE IF TRIGLYCERIDE OVER 400 mg/dL 11/25/2014   TRIG 415* 11/25/2014     Diagnostic Studies/Procedures   Dg Chest Portable 1 View  11/24/2014   CLINICAL  DATA:  Chest pain.  EXAM: PORTABLE CHEST - 1 VIEW  COMPARISON:  04/14/2014.  FINDINGS: The cardiac silhouette remains near the upper limit of normal in size. Clear lungs with normal vascularity. Unremarkable bones.  IMPRESSION: No acute abnormality.   Electronically Signed   By: Enrique Sack M.D.   On: 11/24/2014 16:17    Cardiac Catheterization Procedure Note 11/26/14 Final Conclusions:  1. Nonobstructive CAD. The prior stents are patent and all side branches are patent. The LAD distal to the stents is diffusely  diseased up to 50% but is unchanged from prior angiography. The vessel at this segment is small in diameter and would be difficult to stent. Films reviewed with Dr. Burt Knack.  Recommendations: Continue medical therapy. Will check P2Y12 assay to make sure he is responding to Plavix. If not would switch to Brilinta. While he does have a mildly increased troponin there is a flat curve that is atypical.    Discharge Medications     Medication List    STOP taking these medications        omeprazole 20 MG capsule  Commonly known as:  PRILOSEC  Replaced by:  pantoprazole 40 MG tablet      TAKE these medications        albuterol 108 (90 BASE) MCG/ACT inhaler  Commonly known as:  PROVENTIL HFA;VENTOLIN HFA  Inhale 2 puffs into the lungs every 6 (six) hours as needed for wheezing or shortness of breath.     amLODipine 2.5 MG tablet  Commonly known as:  NORVASC  Take 1 tablet (2.5 mg total) by mouth daily.     aspirin EC 81 MG tablet  Take 81 mg by mouth at bedtime.     citalopram 20 MG tablet  Commonly known as:  CELEXA  Take 20 mg by mouth at bedtime.     clopidogrel 75 MG tablet  Commonly known as:  PLAVIX  Take 1 tablet (75 mg total) by mouth daily with breakfast.     fluticasone 50 MCG/ACT nasal spray  Commonly known as:  FLONASE  Place 1 spray into the nose 2 (two) times daily.     Fluticasone-Salmeterol 250-50 MCG/DOSE Aepb  Commonly known as:  ADVAIR  Inhale 1 puff into the lungs every 12 (twelve) hours.     gabapentin 400 MG capsule  Commonly known as:  NEURONTIN  Take 400 mg by mouth 3 (three) times daily.     insulin aspart 100 UNIT/ML injection  Commonly known as:  novoLOG  Inject 2-30 Units into the skin 3 (three) times daily before meals. Per SSI     LANTUS SOLOSTAR 100 UNIT/ML Solostar Pen  Generic drug:  Insulin Glargine  Inject 60 Units into the skin 2 (two) times daily.     Melatonin 5 MG Tabs  Take 10 mg by mouth at bedtime.     metFORMIN 1000 MG  tablet  Commonly known as:  GLUCOPHAGE  Take 1 tablet (1,000 mg total) by mouth 2 (two) times daily.  Start taking on:  11/29/2014     metoprolol tartrate 25 MG tablet  Commonly known as:  LOPRESSOR  Take 1 tablet (25 mg total) by mouth 2 (two) times daily.     nitroGLYCERIN 0.4 MG SL tablet  Commonly known as:  NITROSTAT  Place 0.4 mg under the tongue every 5 (five) minutes as needed.     pantoprazole 40 MG tablet  Commonly known as:  PROTONIX  Take 1 tablet (40 mg total) by  mouth daily.     ramipril 5 MG tablet  Commonly known as:  ALTACE  Take 5 mg by mouth daily.        Disposition   The patient will be discharged in stable condition to home. Discharge Instructions    Amb Referral to Cardiac Rehabilitation    Complete by:  As directed           Follow-up Information    Follow up with Rozann Lesches, MD On 12/05/2014.   Specialty:  Cardiology   Why:  9:20am   Contact information:   [address] [place] [address] [phone]         Duration of Discharge Encounter: Greater than 30 minutes including physician and PA time.  Mable Fill R PA-C 11/27/2014, 12:30 PM   Patient seen and examined.  Plan as discussed in my rounding note for today and outlined above. Jeneen Rinks [facility]  11/27/2014  12:49 PM

## 2014-11-27 NOTE — Discharge Instructions (Signed)

## 2014-11-27 NOTE — Progress Notes (Signed)
Patient Profile: 59 y.o. male with a history of CAD s/p LAD/OM stenting, HTN, TIA, HLD, OSA on CPAP and DM who was recently admitted to [facility] on 11/20/14 for planned coronary angiography. LHC revealed severe 2 vessel obstructive CAD with instent restenosis of the proximal LAD and de novo lesion in the first OM. S/p PCI w/ DES to pLAD and DES to OM1 by Dr. [name]. Discharged home 1/13 on DAPT. Readmitted 1/16 with recurrent CP similar to pre PCI angina. Enzymes positive x 3.   Subjective: No further CP or SOB. A little dizzy he thinks from imdur.  Objective: Vital signs in last 24 hours: Temp:  [98.3 F (36.8 C)-98.7 F (37.1 C)] 98.3 F (36.8 C) (01/19 0500) Pulse Rate:  [59-76] 62 (01/19 0500) Resp:  [11-24] 20 (01/19 0500) BP: (99-131)/(56-78) 114/61 mmHg (01/19 1014) SpO2:  [86 %-100 %] 98 % (01/19 0949) Last BM Date: 11/25/14  Intake/Output from previous day: 01/18 0701 - 01/19 0700 In: 720 [P.O.:720] Out: 1375 [Urine:1375] Intake/Output this shift: Total I/O In: 240 [P.O.:240] Out: 700 [Urine:700]  Medications Current Facility-Administered Medications  Medication Dose Route Frequency Provider Last Rate Last Dose  . acetaminophen (TYLENOL) tablet 650 mg  650 mg Oral Q4H PRN Lamar Sprinkles, MD   650 mg at 11/25/14 0826  . albuterol (PROVENTIL) (2.5 MG/3ML) 0.083% nebulizer solution 2.5 mg  2.5 mg Nebulization Q6H PRN Ezequiel Essex, MD      . amLODipine (NORVASC) tablet 2.5 mg  2.5 mg Oral QHS Lamar Sprinkles, MD   2.5 mg at 11/26/14 2137  . aspirin EC tablet 81 mg  81 mg Oral Daily Lamar Sprinkles, MD   81 mg at 11/27/14 1014  . citalopram (CELEXA) tablet 20 mg  20 mg Oral QHS Lamar Sprinkles, MD   20 mg at 11/26/14 2137  . clopidogrel (PLAVIX) tablet 75 mg  75 mg Oral QHS Lamar Sprinkles, MD   75 mg at 11/26/14 2137  . fluticasone (FLONASE) 50 MCG/ACT nasal spray 1 spray  1 spray Each Nare BID Lamar Sprinkles, MD   1 spray at 11/27/14 1017  . gabapentin (NEURONTIN) capsule  400 mg  400 mg Oral TID Lamar Sprinkles, MD   400 mg at 11/27/14 1015  . heparin injection 5,000 Units  5,000 Units Subcutaneous 3 times per day [name], MD      . insulin aspart (novoLOG) injection 0-20 Units  0-20 Units Subcutaneous TID [facility] Lamar Sprinkles, MD   3 Units at 11/26/14 1831  . insulin glargine (LANTUS) injection 60 Units  60 Units Subcutaneous BID Ezequiel Essex, MD   60 Units at 11/26/14 2137  . isosorbide mononitrate (IMDUR) 24 hr tablet 30 mg  30 mg Oral Daily [name], MD   30 mg at 11/26/14 1750  . metoprolol tartrate (LOPRESSOR) tablet 25 mg  25 mg Oral BID Lamar Sprinkles, MD   25 mg at 11/27/14 1014  . mometasone-formoterol (DULERA) 100-5 MCG/ACT inhaler 2 puff  2 puff Inhalation BID Lamar Sprinkles, MD   2 puff at 11/27/14 0949  . morphine 2 MG/ML injection 2-4 mg  2-4 mg Intravenous Q3H PRN Lamar Sprinkles, MD   4 mg at 11/26/14 2021  . nitroGLYCERIN (NITROSTAT) SL tablet 0.4 mg  0.4 mg Sublingual Q5 Min x 3 PRN Lamar Sprinkles, MD      . ondansetron [facility]) injection 4 mg  4 mg Intravenous Q6H PRN Lamar Sprinkles, MD   4 mg at 11/26/14 2023  . pantoprazole (PROTONIX)  EC tablet 40 mg  40 mg Oral Daily Lamar Sprinkles, MD   40 mg at 11/27/14 1014  . ramipril (ALTACE) capsule 5 mg  5 mg Oral Daily Ezequiel Essex, MD   5 mg at 11/27/14 1014    PE: General appearance: alert, cooperative and no distress Neck: no JVD Lungs: clear to auscultation bilaterally Heart: regular rate and rhythm Extremities: no LEE Pulses: 2+ and symmetric Skin: warm and dry Neurologic: Grossly normal  Lab Results:   Recent Labs  11/25/14 0200 11/26/14 0537 11/27/14 0457  WBC 3.7* 3.2* 3.1*  HGB 13.3 13.7 13.2  HCT 37.0* 38.7* 36.9*  PLT 114* 106* 99*   BMET  Recent Labs  11/24/14 1605 11/25/14 0740  NA 136 139  K 4.1 4.1  CL 105 103  CO2 23 26  GLUCOSE 291* 167*  BUN 16 13  CREATININE 0.90 1.02  CALCIUM 9.0 8.8   PT/INR No results for input(s): LABPROT,  INR in the last 72 hours. Cholesterol  Recent Labs  11/25/14 0200  CHOL 202*   Cardiac Panel (last 3 results)  Recent Labs  11/24/14 2220 11/25/14 0200 11/25/14 0740  TROPONINI 0.16* 0.14* 0.16*    Cardiac Catheterization Procedure Note  Final Conclusions:  1. Nonobstructive CAD. The prior stents are patent and all side branches are patent. The LAD distal to the stents is diffusely diseased up to 50% but is unchanged from prior angiography. The vessel at this segment is small in diameter and would be difficult to stent. Films reviewed with Dr. Burt Knack. Recommendations: Continue medical therapy. Will check P2Y12 assay to make sure he is responding to Plavix. If not would switch to Brilinta. While he does have a mildly increased troponin there is a flat curve that is atypical.     Assessment/Plan  Active Problems:   Unstable angina   NSTEMI (non-ST elevated myocardial infarction)  Patient Profile: 59 y.o. male with a history of CAD s/p LAD/OM stenting, HTN, TIA, HLD, OSA on CPAP and DM who was recently admitted to [facility] on 11/20/14 for planned coronary angiography. LHC revealed severe 2 vessel obstructive CAD with instent restenosis of the proximal LAD and de novo lesion in the first OM. S/p PCI w/ DES to pLAD and DES to OM1 by Dr. [name]. Discharged home 1/13 on DAPT. Readmitted 1/16 with recurrent CP similar to pre PCI angina. Enzymes positive x 3.   NSTEMI:  While he does have a mildly increased troponin there is a flat curve that is atypical.  -- S/p LHC yesterday which revealed " Nonobstructive CAD. The prior stents are patent and all side branches are patent. The LAD distal to the stents is diffusely diseased up to 50% but is unchanged from prior angiography. The vessel at this segment is small in diameter and would be difficult to stent. Films reviewed with Dr. Burt Knack." -- P2Y12 checked and reveals adequate platelet inhibition. -- Continue ASA, Plavix, BB, statin and ACE. --  Started on imdur 34m and not tolerating this well. Feels a little lightheaded. Does not want to take.   HTN- well controlled on amlodipine 2.558m lopressor 2556mID and ramipril 5mg57mDM-  Metformin is on hold for 48 hours post contrast dye exposure.  Dispo- may be able to go home today. Will put in a discharge order tentatively, final dispo up to Dr. HochPercival Spanish LOS: 3 days    KathAngelena Form-C 11/27/2014 10:32 AM   History and all data above  reviewed.  Patient examined.  I agree with the findings as above.  The patient exam reveals COR:RRR  ,  Lungs: Clear  ,  Abd: Positive bowel sounds, no rebound no guarding, Ext No edema  .  All available labs, radiology testing, previous records reviewed. Agree with documented assessment and plan. CAD:  Medical management.  OK to discharge on meds as listed.    Jeneen Rinks [facility]  11:36 AM  11/27/2014

## 2014-11-27 NOTE — Care Management Note (Signed)
    Page 1 of 1   11/27/2014     3:44:49 PM CARE MANAGEMENT NOTE 11/27/2014  Patient:  [name] [name], [name] [name]   Account Number:  [id number]  Date Initiated:  11/27/2014  Documentation initiated by:  GRAVES-BIGELOW,Samin Milke  Subjective/Objective Assessment:   Pt admitted for cp.     Action/Plan:   No needs identified from CM at this time.   Anticipated DC Date:  11/27/2014   Anticipated DC Plan:  [place]  CM consult      Choice offered to / List presented to:             Status of service:  Completed, signed off Medicare Important Message given?  YES (If response is "NO", the following Medicare IM given date fields will be blank) Date Medicare IM given:  11/27/2014 Medicare IM given by:  GRAVES-BIGELOW,Parissa Chiao Date Additional Medicare IM given:   Additional Medicare IM given by:    Discharge Disposition:  HOME/SELF CARE  Per UR Regulation:  Reviewed for med. necessity/level of care/duration of stay  If discussed at [place] of Stay Meetings, dates discussed:    Comments:

## 2014-11-27 NOTE — Progress Notes (Signed)
CARDIAC REHAB PHASE I   PRE:  Rate/Rhythm: 74 SR  BP:  Supine:   Sitting: 118/50  Standing:    SaO2: 97 RA  MODE:  Ambulation: 550 ft   POST:  Rate/Rhythm: 74  BP:  Supine:   Sitting: 142/40  Standing:    SaO2: 96 RA 1050-1145 Pt tolerated ambulation well without c/o of cp. He did c/o of feeling lightheaded by end of walk, BP after walk stable 142/40. Completed MI education with pt and wife. They voice understanding. I completed stent, diet and exercise education with pt 11/21/14 and signed him up for [facility] in [place]. I reviewed other education with pt and wife. I will inform [place] [facility] about pt's readmission.  Rodney Langton RN 11/27/2014 11:42 AM

## 2014-11-27 NOTE — Telephone Encounter (Signed)
No return call from patient. Patient seen in ED since encounter created.

## 2014-12-03 ENCOUNTER — Telehealth: Payer: Self-pay | Admitting: *Deleted

## 2014-12-03 ENCOUNTER — Observation Stay (HOSPITAL_COMMUNITY)
Admission: EM | Admit: 2014-12-03 | Discharge: 2014-12-04 | Disposition: A | Discharge status: Home / Self Care | Payer: Medicare Other | Attending: Cardiology | Admitting: Cardiology

## 2014-12-03 ENCOUNTER — Encounter (HOSPITAL_COMMUNITY): Payer: Self-pay | Admitting: Emergency Medicine

## 2014-12-03 ENCOUNTER — Emergency Department (HOSPITAL_COMMUNITY): Payer: Medicare Other

## 2014-12-03 DIAGNOSIS — J45909 Unspecified asthma, uncomplicated: Secondary | ICD-10-CM | POA: Insufficient documentation

## 2014-12-03 DIAGNOSIS — R072 Precordial pain: Secondary | ICD-10-CM

## 2014-12-03 DIAGNOSIS — Z9981 Dependence on supplemental oxygen: Secondary | ICD-10-CM | POA: Diagnosis not present

## 2014-12-03 DIAGNOSIS — G43909 Migraine, unspecified, not intractable, without status migrainosus: Secondary | ICD-10-CM | POA: Diagnosis not present

## 2014-12-03 DIAGNOSIS — G4733 Obstructive sleep apnea (adult) (pediatric): Secondary | ICD-10-CM | POA: Diagnosis present

## 2014-12-03 DIAGNOSIS — K219 Gastro-esophageal reflux disease without esophagitis: Secondary | ICD-10-CM | POA: Diagnosis not present

## 2014-12-03 DIAGNOSIS — Z7902 Long term (current) use of antithrombotics/antiplatelets: Secondary | ICD-10-CM | POA: Insufficient documentation

## 2014-12-03 DIAGNOSIS — E1165 Type 2 diabetes mellitus with hyperglycemia: Secondary | ICD-10-CM

## 2014-12-03 DIAGNOSIS — E782 Mixed hyperlipidemia: Secondary | ICD-10-CM | POA: Diagnosis present

## 2014-12-03 DIAGNOSIS — M1990 Unspecified osteoarthritis, unspecified site: Secondary | ICD-10-CM | POA: Insufficient documentation

## 2014-12-03 DIAGNOSIS — R079 Chest pain, unspecified: Principal | ICD-10-CM | POA: Insufficient documentation

## 2014-12-03 DIAGNOSIS — Z7952 Long term (current) use of systemic steroids: Secondary | ICD-10-CM | POA: Insufficient documentation

## 2014-12-03 DIAGNOSIS — Z9861 Coronary angioplasty status: Secondary | ICD-10-CM | POA: Insufficient documentation

## 2014-12-03 DIAGNOSIS — Z7982 Long term (current) use of aspirin: Secondary | ICD-10-CM | POA: Insufficient documentation

## 2014-12-03 DIAGNOSIS — F419 Anxiety disorder, unspecified: Secondary | ICD-10-CM | POA: Diagnosis present

## 2014-12-03 DIAGNOSIS — E780 Pure hypercholesterolemia: Secondary | ICD-10-CM | POA: Insufficient documentation

## 2014-12-03 DIAGNOSIS — G459 Transient cerebral ischemic attack, unspecified: Secondary | ICD-10-CM | POA: Diagnosis present

## 2014-12-03 DIAGNOSIS — D696 Thrombocytopenia, unspecified: Secondary | ICD-10-CM | POA: Insufficient documentation

## 2014-12-03 DIAGNOSIS — E1169 Type 2 diabetes mellitus with other specified complication: Secondary | ICD-10-CM

## 2014-12-03 DIAGNOSIS — E669 Obesity, unspecified: Secondary | ICD-10-CM

## 2014-12-03 DIAGNOSIS — Z794 Long term (current) use of insulin: Secondary | ICD-10-CM | POA: Insufficient documentation

## 2014-12-03 DIAGNOSIS — G8929 Other chronic pain: Secondary | ICD-10-CM | POA: Insufficient documentation

## 2014-12-03 DIAGNOSIS — E119 Type 2 diabetes mellitus without complications: Secondary | ICD-10-CM | POA: Diagnosis not present

## 2014-12-03 DIAGNOSIS — Z79899 Other long term (current) drug therapy: Secondary | ICD-10-CM | POA: Diagnosis not present

## 2014-12-03 DIAGNOSIS — I10 Essential (primary) hypertension: Secondary | ICD-10-CM | POA: Diagnosis not present

## 2014-12-03 DIAGNOSIS — Z8673 Personal history of transient ischemic attack (TIA), and cerebral infarction without residual deficits: Secondary | ICD-10-CM | POA: Diagnosis not present

## 2014-12-03 DIAGNOSIS — I2510 Atherosclerotic heart disease of native coronary artery without angina pectoris: Secondary | ICD-10-CM | POA: Diagnosis not present

## 2014-12-03 DIAGNOSIS — I208 Other forms of angina pectoris: Secondary | ICD-10-CM

## 2014-12-03 LAB — BASIC METABOLIC PANEL
Anion gap: 8 (ref 5–15)
BUN: 17 mg/dL (ref 6–23)
CO2: 28 mmol/L (ref 19–32)
Calcium: 9.6 mg/dL (ref 8.4–10.5)
Chloride: 100 mmol/L (ref 96–112)
Creatinine, Ser: 1.11 mg/dL (ref 0.50–1.35)
GFR calc Af Amer: 84 mL/min — ABNORMAL LOW (ref >90)
GFR, EST NON AFRICAN AMERICAN: 72 mL/min — AB (ref >90)
Glucose, Bld: 364 mg/dL — ABNORMAL HIGH (ref 70–99)
Potassium: 4.5 mmol/L (ref 3.5–5.1)
SODIUM: 136 mmol/L (ref 135–145)

## 2014-12-03 LAB — CBC WITH DIFFERENTIAL/PLATELET
Basophils Absolute: 0 10*3/uL (ref 0.0–0.1)
Basophils Relative: 0 % (ref 0–1)
EOS ABS: 0.1 10*3/uL (ref 0.0–0.7)
EOS PCT: 2 % (ref 0–5)
HEMATOCRIT: 42.3 % (ref 39.0–52.0)
HEMOGLOBIN: 14.8 g/dL (ref 13.0–17.0)
LYMPHS ABS: 1.1 10*3/uL (ref 0.7–4.0)
Lymphocytes Relative: 34 % (ref 12–46)
MCH: 31.2 pg (ref 26.0–34.0)
MCHC: 35 g/dL (ref 30.0–36.0)
MCV: 89.1 fL (ref 78.0–100.0)
Monocytes Absolute: 0.2 10*3/uL (ref 0.1–1.0)
Monocytes Relative: 6 % (ref 3–12)
Neutro Abs: 1.9 10*3/uL (ref 1.7–7.7)
Neutrophils Relative %: 58 % (ref 43–77)
Platelets: 133 10*3/uL — ABNORMAL LOW (ref 150–400)
RBC: 4.75 MIL/uL (ref 4.22–5.81)
RDW: 12.7 % (ref 11.5–15.5)
WBC: 3.3 10*3/uL — ABNORMAL LOW (ref 4.0–10.5)

## 2014-12-03 LAB — I-STAT TROPONIN, ED: TROPONIN I, POC: 0 ng/mL (ref 0.00–0.08)

## 2014-12-03 LAB — GLUCOSE, CAPILLARY: Glucose-Capillary: 299 mg/dL — ABNORMAL HIGH (ref 70–99)

## 2014-12-03 LAB — TROPONIN I: Troponin I: <0.03 ng/mL (ref <0.031)

## 2014-12-03 LAB — BRAIN NATRIURETIC PEPTIDE: B NATRIURETIC PEPTIDE 5: 10.6 pg/mL (ref 0.0–100.0)

## 2014-12-03 MED ORDER — AMLODIPINE BESYLATE 2.5 MG PO TABS
2.5000 mg | ORAL_TABLET | Freq: Every day | ORAL | Status: DC
  Administered 2014-12-03: 2.5 mg via ORAL
  Filled 2014-12-03: qty 1

## 2014-12-03 MED ORDER — MOMETASONE FURO-FORMOTEROL FUM 100-5 MCG/ACT IN AERO
2.0000 | INHALATION_SPRAY | Freq: Two times a day (BID) | RESPIRATORY_TRACT | Status: DC
  Administered 2014-12-04: 2 via RESPIRATORY_TRACT
  Filled 2014-12-03: qty 8.8

## 2014-12-03 MED ORDER — CLOPIDOGREL BISULFATE 75 MG PO TABS
75.0000 mg | ORAL_TABLET | Freq: Every day | ORAL | Status: DC
  Administered 2014-12-03: 75 mg via ORAL
  Filled 2014-12-03: qty 1

## 2014-12-03 MED ORDER — INSULIN GLARGINE 100 UNIT/ML SOLOSTAR PEN: 60.0000 [IU] | PEN_INJECTOR | Freq: Two times a day (BID) | SUBCUTANEOUS | Status: DC

## 2014-12-03 MED ORDER — INSULIN GLARGINE 100 UNIT/ML ~~LOC~~ SOLN
60.0000 [IU] | Freq: Two times a day (BID) | SUBCUTANEOUS | Status: DC
  Administered 2014-12-03 – 2014-12-04 (×2): 60 [IU] via SUBCUTANEOUS
  Filled 2014-12-03 (×3): qty 0.6

## 2014-12-03 MED ORDER — MELATONIN 5 MG PO TABS: 10.0000 mg | ORAL_TABLET | Freq: Every day | ORAL | Status: DC

## 2014-12-03 MED ORDER — GABAPENTIN 400 MG PO CAPS
400.0000 mg | ORAL_CAPSULE | Freq: Three times a day (TID) | ORAL | Status: DC
  Administered 2014-12-03 – 2014-12-04 (×2): 400 mg via ORAL
  Filled 2014-12-03 (×2): qty 1

## 2014-12-03 MED ORDER — METFORMIN HCL 500 MG PO TABS: 1000.0000 mg | ORAL_TABLET | Freq: Two times a day (BID) | ORAL | Status: DC

## 2014-12-03 MED ORDER — ACETAMINOPHEN 325 MG PO TABS: 650.0000 mg | ORAL_TABLET | ORAL | Status: DC | PRN

## 2014-12-03 MED ORDER — ZOLPIDEM TARTRATE 5 MG PO TABS: 5.0000 mg | ORAL_TABLET | Freq: Every evening | ORAL | Status: DC | PRN

## 2014-12-03 MED ORDER — ALPRAZOLAM 0.25 MG PO TABS: 0.2500 mg | ORAL_TABLET | Freq: Two times a day (BID) | ORAL | Status: DC | PRN

## 2014-12-03 MED ORDER — ALBUTEROL SULFATE HFA 108 (90 BASE) MCG/ACT IN AERS: 2.0000 | INHALATION_SPRAY | Freq: Four times a day (QID) | RESPIRATORY_TRACT | Status: DC | PRN

## 2014-12-03 MED ORDER — ENOXAPARIN SODIUM 40 MG/0.4ML ~~LOC~~ SOLN
40.0000 mg | SUBCUTANEOUS | Status: DC
  Administered 2014-12-03: 40 mg via SUBCUTANEOUS
  Filled 2014-12-03: qty 0.4

## 2014-12-03 MED ORDER — ONDANSETRON HCL 4 MG/2ML IJ SOLN: 4.0000 mg | Freq: Four times a day (QID) | INTRAMUSCULAR | Status: DC | PRN

## 2014-12-03 MED ORDER — NITROGLYCERIN 0.4 MG SL SUBL
0.4000 mg | SUBLINGUAL_TABLET | SUBLINGUAL | Status: DC | PRN
Start: 2014-12-03 — End: 2014-12-04

## 2014-12-03 MED ORDER — RAMIPRIL 5 MG PO TABS
5.0000 mg | ORAL_TABLET | Freq: Every day | ORAL | Status: DC
Start: 2014-12-03 — End: 2014-12-03

## 2014-12-03 MED ORDER — CITALOPRAM HYDROBROMIDE 20 MG PO TABS
20.0000 mg | ORAL_TABLET | Freq: Every day | ORAL | Status: DC
  Administered 2014-12-03: 20 mg via ORAL
  Filled 2014-12-03: qty 1

## 2014-12-03 MED ORDER — FLUTICASONE PROPIONATE 50 MCG/ACT NA SUSP
1.0000 | Freq: Two times a day (BID) | NASAL | Status: DC
  Administered 2014-12-03: 1 via NASAL
  Filled 2014-12-03: qty 16

## 2014-12-03 MED ORDER — RAMIPRIL 5 MG PO CAPS
5.0000 mg | ORAL_CAPSULE | Freq: Every day | ORAL | Status: DC
  Administered 2014-12-04: 5 mg via ORAL
  Filled 2014-12-03: qty 1

## 2014-12-03 MED ORDER — METOPROLOL TARTRATE 50 MG PO TABS
50.0000 mg | ORAL_TABLET | Freq: Two times a day (BID) | ORAL | Status: DC
  Administered 2014-12-03 – 2014-12-04 (×2): 50 mg via ORAL
  Filled 2014-12-03 (×2): qty 1

## 2014-12-03 MED ORDER — ALBUTEROL SULFATE (2.5 MG/3ML) 0.083% IN NEBU: 2.5000 mg | INHALATION_SOLUTION | Freq: Four times a day (QID) | RESPIRATORY_TRACT | Status: DC | PRN

## 2014-12-03 MED ORDER — ASPIRIN EC 81 MG PO TBEC
81.0000 mg | DELAYED_RELEASE_TABLET | Freq: Every day | ORAL | Status: DC
  Administered 2014-12-03: 81 mg via ORAL
  Filled 2014-12-03: qty 1

## 2014-12-03 MED ORDER — PANTOPRAZOLE SODIUM 40 MG PO TBEC
40.0000 mg | DELAYED_RELEASE_TABLET | Freq: Every day | ORAL | Status: DC
  Administered 2014-12-03 – 2014-12-04 (×2): 40 mg via ORAL
  Filled 2014-12-03 (×2): qty 1

## 2014-12-03 MED ORDER — METFORMIN HCL 500 MG PO TABS
1000.0000 mg | ORAL_TABLET | Freq: Two times a day (BID) | ORAL | Status: DC
  Administered 2014-12-04: 1000 mg via ORAL
  Filled 2014-12-03: qty 2

## 2014-12-03 NOTE — ED Notes (Signed)
Dr. Mariam Dollar allows for patient to have water at this time. Awaiting admission orders.

## 2014-12-03 NOTE — ED Notes (Signed)
Pt c/o left sided CP and SOB starting today; pt sts hx of recent heart cath

## 2014-12-03 NOTE — ED Provider Notes (Signed)
CSN: [id number]     Arrival date & time 12/03/14  1625 History   First MD Initiated Contact with Patient 12/03/14 1643     Chief Complaint  Patient presents with  . Chest Pain     (Consider location/radiation/quality/duration/timing/severity/associated sxs/prior Treatment) Patient is a 59 y.o. male presenting with chest pain. The history is provided by the patient and the spouse. No language interpreter was used.  Chest Pain Pain location:  L chest Pain quality: pressure   Pain radiates to:  L shoulder Pain radiates to the back: no   Pain severity:  Moderate Onset quality:  Gradual Duration:  1 day Timing:  Intermittent Progression:  Waxing and waning Chronicity:  New Context: not breathing, no movement and not raising an arm   Relieved by:  Nitroglycerin and rest Worsened by:  Exertion Ineffective treatments:  None tried Associated symptoms: no abdominal pain, no anxiety, no cough, no dizziness, no fever, no heartburn, no nausea, no numbness, no shortness of breath and not vomiting   Risk factors: coronary artery disease, diabetes mellitus, high cholesterol, hypertension and male sex   Risk factors: no aortic disease and no birth control     Past Medical History  Diagnosis Date  . Coronary atherosclerosis of native coronary artery     a. h/o LAD & OM stenting;  b. 2007 Cath: nonobs dzs, patent stents;  c. 07/2012 neg MV, EF 61%.  d. 11/20/14: [place] s/p  PCI w/ DES to pLAD and DES to OM1   . Asthma   . Essential hypertension, benign   . TIA (transient ischemic attack) ~ 2010  . Anxiety   . Hypercholesteremia   . GERD (gastroesophageal reflux disease)   . OSA on CPAP     "suppose to wear mask; don't always" (11/20/2014)  . Type 2 diabetes mellitus   . History of hiatal hernia   . Migraine     "maybe monthly" (11/20/2014)  . Arthritis     "hands" (11/20/2014)  . Lumbar herniated disc   . Chronic lower back pain   . History of gout   . Thrombocytopenia    Past Surgical  History  Procedure Laterality Date  . Anterior cervical decomp/discectomy fusion  1998    "C3-4"  . Carpal tunnel release Bilateral 2005  . Shoulder arthroscopy Left ~ 2011  . Knee surgery Left 02/2012    "scraped; open"  . Neuroplasty / transposition ulnar nerve at elbow Right ~ 2012  . Left heart catheterization with coronary angiogram N/A 07/20/2012    Procedure: LEFT HEART CATHETERIZATION WITH CORONARY ANGIOGRAM;  Surgeon: [name], MD;  Location: [facility] CATH LAB;  Service: Cardiovascular;  Laterality: N/A;  . Coronary angioplasty with stent placement  2002; 2003; 11/20/2014    "I have 4 stents after today" (11/20/2014)  . Cardiac catheterization  "several"  . Coronary angioplasty    . Left heart catheterization with coronary angiogram N/A 11/20/2014    Procedure: LEFT HEART CATHETERIZATION WITH CORONARY ANGIOGRAM;  Surgeon: [name], MD;  Location: [facility] CATH LAB;  Service: Cardiovascular;  Laterality: N/A;  . Percutaneous coronary rotoblator intervention (pci-r)  11/20/2014    Procedure: PERCUTANEOUS CORONARY ROTOBLATOR INTERVENTION (PCI-R);  Surgeon: [name], MD;  Location: [facility] CATH LAB;  Service: Cardiovascular;;  . Left heart catheterization with coronary angiogram N/A 11/26/2014    Procedure: LEFT HEART CATHETERIZATION WITH CORONARY ANGIOGRAM;  Surgeon: [name], MD;  Location: [facility] CATH LAB;  Service: Cardiovascular;  Laterality: N/A;  Family History  Problem Relation Age of Onset  . Coronary artery disease Father 104   History  Substance Use Topics  . Smoking status: Never Smoker   . Smokeless tobacco: Never Used  . Alcohol Use: No    Review of Systems  Constitutional: Negative for fever.  Respiratory: Negative for cough and shortness of breath.   Cardiovascular: Positive for chest pain.  Gastrointestinal: Negative for heartburn, nausea, vomiting and abdominal pain.  Genitourinary: Negative for dysuria, urgency and frequency.  Neurological: Negative for  dizziness and numbness.  All other systems reviewed and are negative.     Allergies  Imdur and Statins  Home Medications   Prior to Admission medications   Medication Sig Start Date End Date Taking? Authorizing Provider  albuterol (PROVENTIL HFA;VENTOLIN HFA) 108 (90 BASE) MCG/ACT inhaler Inhale 2 puffs into the lungs every 6 (six) hours as needed for wheezing or shortness of breath.   Yes Historical Provider, MD  amLODipine (NORVASC) 2.5 MG tablet Take 1 tablet (2.5 mg total) by mouth daily. Patient taking differently: Take 2.5 mg by mouth at bedtime.  11/16/14  Yes [name], MD  aspirin EC 81 MG tablet Take 81 mg by mouth at bedtime.    Yes Historical Provider, MD  citalopram (CELEXA) 20 MG tablet Take 20 mg by mouth at bedtime.    Yes Historical Provider, MD  clopidogrel (PLAVIX) 75 MG tablet Take 1 tablet (75 mg total) by mouth daily with breakfast. Patient taking differently: Take 75 mg by mouth at bedtime.  11/21/14  Yes Eileen Stanford, PA-C  fluticasone (FLONASE) 50 MCG/ACT nasal spray Place 1 spray into the nose 2 (two) times daily.    Yes Historical Provider, MD  Fluticasone-Salmeterol (ADVAIR) 250-50 MCG/DOSE AEPB Inhale 1 puff into the lungs every 12 (twelve) hours.   Yes Historical Provider, MD  gabapentin (NEURONTIN) 400 MG capsule Take 400 mg by mouth 3 (three) times daily.   Yes Historical Provider, MD  insulin aspart (NOVOLOG) 100 UNIT/ML injection Inject 2-30 Units into the skin 3 (three) times daily before meals. Per SSI   Yes Historical Provider, MD  Insulin Glargine (LANTUS SOLOSTAR) 100 UNIT/ML Solostar Pen Inject 60 Units into the skin 2 (two) times daily.   Yes Historical Provider, MD  Melatonin 5 MG TABS Take 10 mg by mouth at bedtime.    Yes Historical Provider, MD  metFORMIN (GLUCOPHAGE) 1000 MG tablet Take 1 tablet (1,000 mg total) by mouth 2 (two) times daily. 11/29/14  Yes Eileen Stanford, PA-C  pantoprazole (PROTONIX) 40 MG tablet Take 1 tablet (40  mg total) by mouth daily. 11/27/14  Yes Eileen Stanford, PA-C  ramipril (ALTACE) 5 MG tablet Take 5 mg by mouth daily.   Yes Historical Provider, MD  metoprolol tartrate (LOPRESSOR) 50 MG tablet Take 1 tablet (50 mg total) by mouth 2 (two) times daily. 12/04/14   Almyra Deforest, PA  nitroGLYCERIN (NITROSTAT) 0.4 MG SL tablet Place 0.4 mg under the tongue every 5 (five) minutes as needed.    Historical Provider, MD   BP 120/67 mmHg  Pulse 70  Temp(Src) 98.6 F (37 C) (Oral)  Resp 19  Ht 5' 7"  (1.702 m)  Wt 228 lb 11.2 oz (103.738 kg)  BMI 35.81 kg/m2  SpO2 99% Physical Exam  Constitutional: He is oriented to person, place, and time. He appears well-developed and well-nourished. No distress.  HENT:  Head: Normocephalic and atraumatic.  Eyes: Pupils are equal, round, and reactive to  light.  Neck: Normal range of motion.  Cardiovascular: Normal rate, regular rhythm and normal heart sounds.   Pulmonary/Chest: Effort normal and breath sounds normal. No respiratory distress. He has no decreased breath sounds. He has no wheezes. He has no rhonchi. He has no rales.  Abdominal: Soft. He exhibits no distension. There is no tenderness. There is no rebound and no guarding.  Musculoskeletal: He exhibits no edema or tenderness.  Neurological: He is alert and oriented to person, place, and time. He exhibits normal muscle tone.  Skin: Skin is warm and dry.  Nursing note and vitals reviewed.   ED Course  Procedures (including critical care time) Labs Review Labs Reviewed  BASIC METABOLIC PANEL - Abnormal; Notable for the following:    Glucose, Bld 364 (*)    GFR calc non Af Amer 72 (*)    GFR calc Af Amer 84 (*)    All other components within normal limits  CBC WITH DIFFERENTIAL/PLATELET - Abnormal; Notable for the following:    WBC 3.3 (*)    Platelets 133 (*)    All other components within normal limits  GLUCOSE, CAPILLARY - Abnormal; Notable for the following:    Glucose-Capillary 299 (*)     All other components within normal limits  GLUCOSE, CAPILLARY - Abnormal; Notable for the following:    Glucose-Capillary 144 (*)    All other components within normal limits  GLUCOSE, CAPILLARY - Abnormal; Notable for the following:    Glucose-Capillary 202 (*)    All other components within normal limits  BRAIN NATRIURETIC PEPTIDE  TROPONIN I  TROPONIN I  TROPONIN I  I-STAT TROPOININ, ED    Imaging Review Dg Chest 2 View  12/03/2014   CLINICAL DATA:  Left-sided chest pain.  Shortness of breath.  EXAM: CHEST  2 VIEW  COMPARISON:  11/24/2014.  FINDINGS: Mediastinum and hilar structures normal. Lungs are clear. No pleural effusion or pneumothorax. Heart size normal. Coronary artery calcification noted. No acute bony abnormality.  IMPRESSION: 1. No acute cardiopulmonary disease. 2. Coronary artery disease.   Electronically Signed   By: Marcello Moores  Register   On: 12/03/2014 17:07     EKG Interpretation   Date/Time:  Monday December 03 2014 16:31:34 EST Ventricular Rate:  90 PR Interval:  124 QRS Duration: 96 QT Interval:  358 QTC Calculation: 437 R Axis:   15 Text Interpretation:  Normal sinus rhythm Normal ECG Confirmed by RAY MD,  Andee Poles ([id number]) on 12/03/2014 6:08:51 PM      MDM   Final diagnoses:  Chest pain   Patient is a 59 year old Caucasian male with pertinent past medical history of a cardiac catheterization with stents placed two weeks ago who comes to the emergency department today with left-sided chest pressure radiating to the left shoulder worse with exertion for the past day. Physical exam as above. Patient contacted his cardiologist who told him to go to the emergency department for evaluation. Initial workup included an i-STAT troponin, BMP, BNP, CBC, EKG, and a chest x-ray. EKG is detailed above. I-STAT troponin was negative. BMP had a glucose of 364 and was otherwise unremarkable. BNP was 10.6. CBC was unremarkable. Chest x-ray demonstrated no consolidations doubt a  pneumonia or new CHF. There is no evidence of pneumothorax. Patient was treated with nitroglycerin with resolution of his symptoms. With patient's recent history of stents placed and concerning pain he was felt to require evaluation by cardiology. Cardiology recommended admission to the hospital. Patient was admitted to the cardiology  service in good condition. Labs and imaging reviewed by myself and considered in medical decision making. Imaging was interpreted by radiology. Care was discussed with my attending Dr. Jeanell Sparrow.      Katheren Shams, MD 12/04/14 Eloy Ray, MD 12/04/14 [phone]

## 2014-12-03 NOTE — Progress Notes (Signed)
PHARMACIST - PHYSICIAN ORDER COMMUNICATION  CONCERNING: P&T Medication Policy on Herbal Medications  DESCRIPTION:  This patient's order for:  melatonin  has been noted.  This product(s) is classified as an "herbal" or natural product. Due to a lack of definitive safety studies or FDA approval, nonstandard manufacturing practices, plus the potential risk of unknown drug-drug interactions while on inpatient medications, the Pharmacy and Therapeutics Committee does not permit the use of "herbal" or natural products of this type within [facility].   ACTION TAKEN: The pharmacy department is unable to verify this order at this time and your patient has been informed of this safety policy. Please reevaluate patient's clinical condition at discharge and address if the herbal or natural product(s) should be resumed at that time.  Uvaldo Rising, BCPS  Clinical Pharmacist Pager [phone]  12/03/2014 9:29 PM

## 2014-12-03 NOTE — ED Notes (Signed)
Called [facility] [address] back, patient's bed has been changed to [address] [place].

## 2014-12-03 NOTE — Telephone Encounter (Signed)
Chest pain that started yesterday while at [facility] rated # 5 on a scale of 1-10 (10 being the greatest), and today while patient was at [facility] visiting a family member rated # 43. Patient said that he felt dizziness and sob with the chest pain today. While on phone with patient, he said his chest pain was rated #3. Patient said he did use the nitroglycerin on yesterday and felt relief after using 1 dose. Patient said he did not take the nitroglycerin today. Nurse advised patient to go to the ED for an evaluation. Patient verbalized understanding of plan.

## 2014-12-03 NOTE — ED Notes (Signed)
dulera and altace not on the unit.

## 2014-12-03 NOTE — ED Notes (Signed)
[name] [name], [name] with cardiology returned page, she confirms admission orders will be placed for this patient.

## 2014-12-03 NOTE — ED Notes (Signed)
Patient transported to X-ray 

## 2014-12-03 NOTE — ED Notes (Signed)
Dr. Rosiland Oz at bedside.

## 2014-12-03 NOTE — ED Notes (Signed)
Paged Cardiology, PA, at [phone] with call back number [phone].

## 2014-12-03 NOTE — H&P (Signed)
History and Physical   Patient ID: [name] [name] MRN: [id number], DOB/AGE: Sep 07, 1956 59 y.o. Date of Encounter: 12/03/2014  Primary Physician: Deloria Lair, MD Primary Cardiologist: Dr. Domenic Polite  Chief Complaint:  Chest pain  HPI: [name] [name] is a 60 y.o. male with a long history of CAD. He was admitted with 01/12 with USAP and had DES to the LAD and OM1. He was hospitalized 01/16-01/19 with chest pain, cardiac catheterization results below. Medical therapy for distal LAD disease was recommended but he did not tolerate oral nitrates.  He has done well since discharge, ambulating per cardiac rehabilitation guidelines. He has not had chest pain or shortness of breath with ambulation. His angina is chest tightness or pressure that is substernal.  Today, he was walking through the [facility] to visit a family member. He had sudden onset of severe, sharp, 9/10 chest pain. It took his breath away. He became lightheaded and almost fell. He was nauseated but did not vomit. He got diaphoretic and was very pale according to his wife. He called the office and weighs requested he come to the emergency room. He did not take any medications or seek medical help. The pain lasted 2 or 3 minutes. It eased off and gradually resolved. It was unlike his previous angina. He has never had pain like this before. His symptoms concerned him so he decided to come to the ER. In the emergency room, he is resting comfortably and his ECG is not acute. Initial enzymes are negative.   Past Medical History  Diagnosis Date  . Coronary atherosclerosis of native coronary artery     a. h/o LAD & OM stenting;  b. 2007 Cath: nonobs dzs, patent stents;  c. 07/2012 neg MV, EF 61%.  d. 11/20/14: [place] s/p  PCI w/ DES to pLAD and DES to OM1   . Asthma   . Essential hypertension, benign   . TIA (transient ischemic attack) ~ 2010  . Anxiety   . Hypercholesteremia   . GERD (gastroesophageal reflux disease)   . OSA  on CPAP     "suppose to wear mask; don't always" (11/20/2014)  . Type 2 diabetes mellitus   . History of hiatal hernia   . Migraine     "maybe monthly" (11/20/2014)  . Arthritis     "hands" (11/20/2014)  . Lumbar herniated disc   . Chronic lower back pain   . History of gout   . Thrombocytopenia     Surgical History:  Past Surgical History  Procedure Laterality Date  . Anterior cervical decomp/discectomy fusion  1998    "C3-4"  . Carpal tunnel release Bilateral 2005  . Shoulder arthroscopy Left ~ 2011  . Knee surgery Left 02/2012    "scraped; open"  . Neuroplasty / transposition ulnar nerve at elbow Right ~ 2012  . Left heart catheterization with coronary angiogram N/A 07/20/2012    Procedure: LEFT HEART CATHETERIZATION WITH CORONARY ANGIOGRAM;  Surgeon: [name], MD;  Location: [place] CATH LAB;  Service: Cardiovascular;  Laterality: N/A;  . Coronary angioplasty with stent placement  2002; 2003; 11/20/2014    "I have 4 stents after today" (11/20/2014)  . Cardiac catheterization  "several"  . Coronary angioplasty    . Left heart catheterization with coronary angiogram N/A 11/20/2014    Procedure: LEFT HEART CATHETERIZATION WITH CORONARY ANGIOGRAM;  Surgeon: [name], MD;  Location: [facility] CATH LAB;  Service: Cardiovascular;  Laterality: N/A;  . Percutaneous coronary  rotoblator intervention (pci-r)  11/20/2014    Procedure: PERCUTANEOUS CORONARY ROTOBLATOR INTERVENTION (PCI-R);  Surgeon: [name], MD;  Location: [facility] CATH LAB;  Service: Cardiovascular;;  . Left heart catheterization with coronary angiogram N/A 11/26/2014    Procedure: LEFT HEART CATHETERIZATION WITH CORONARY ANGIOGRAM;  Surgeon: [name], MD;  Location: [facility] CATH LAB;  Service: Cardiovascular;  Laterality: N/A;     I have reviewed the patient's current medications. Medication Sig  albuterol (PROVENTIL HFA;VENTOLIN HFA) 108 (90 BASE) MCG/ACT inhaler Inhale 2 puffs into the lungs every 6 (six) hours as needed  for wheezing or shortness of breath.  amLODipine (NORVASC) 2.5 MG tablet Take 1 tablet (2.5 mg total) by mouth daily. Patient taking differently: Take 2.5 mg by mouth at bedtime.   aspirin EC 81 MG tablet Take 81 mg by mouth at bedtime.   citalopram (CELEXA) 20 MG tablet Take 20 mg by mouth at bedtime.   clopidogrel (PLAVIX) 75 MG tablet Take 1 tablet (75 mg total) by mouth daily with breakfast. Patient taking differently: Take 75 mg by mouth at bedtime.   fluticasone (FLONASE) 50 MCG/ACT nasal spray Place 1 spray into the nose 2 (two) times daily.   Fluticasone-Salmeterol (ADVAIR) 250-50 MCG/DOSE AEPB Inhale 1 puff into the lungs every 12 (twelve) hours.  gabapentin (NEURONTIN) 400 MG capsule Take 400 mg by mouth 3 (three) times daily.  insulin aspart (NOVOLOG) 100 UNIT/ML injection Inject 2-30 Units into the skin 3 (three) times daily before meals. Per SSI  Insulin Glargine (LANTUS SOLOSTAR) 100 UNIT/ML Solostar Pen Inject 60 Units into the skin 2 (two) times daily.  Melatonin 5 MG TABS Take 10 mg by mouth at bedtime.   metFORMIN (GLUCOPHAGE) 1000 MG tablet Take 1 tablet (1,000 mg total) by mouth 2 (two) times daily.  metoprolol tartrate (LOPRESSOR) 25 MG tablet Take 1 tablet (25 mg total) by mouth 2 (two) times daily.  nitroGLYCERIN (NITROSTAT) 0.4 MG SL tablet Place 0.4 mg under the tongue every 5 (five) minutes as needed.  pantoprazole (PROTONIX) 40 MG tablet Take 1 tablet (40 mg total) by mouth daily.  ramipril (ALTACE) 5 MG tablet Take 5 mg by mouth daily.   Scheduled Meds:  Continuous Infusions:  PRN Meds:.nitroGLYCERIN  Allergies:  Allergies  Allergen Reactions  . Imdur [Isosorbide Dinitrate]     Headache, severe  . Statins     Muscle aches and cramps    History   Social History  . Marital Status: Married    Spouse Name: [name]    Number of Children: 3  . Years of Education: 12 th    Occupational History  . Unemployed    Social History Main Topics  . Smoking  status: Never Smoker   . Smokeless tobacco: Never Used  . Alcohol Use: No  . Drug Use: No  . Sexual Activity: Yes   Other Topics Concern  . Not on file   Social History Narrative   Patient lives at home with wife [name].   Patient does not work.    Patient has a 12 th grade education.    Patient has 3 children.           Family History  Problem Relation Age of Onset  . Coronary artery disease Father 67   Family Status  Relation Status Death Age  . Mother Alive   . Father Alive     Review of Systems:   Full 14-point review of systems otherwise negative except as noted above.  Physical Exam: Blood pressure 125/71, pulse 75, temperature 98.5 F (36.9 C), temperature source Oral, resp. rate 17, height 5' 9"  (1.753 m), weight 232 lb (105.235 kg), SpO2 98 %. General: Well developed, well nourished,male in no acute distress. Head: Normocephalic, atraumatic, sclera non-icteric, no xanthomas, nares are without discharge. Dentition: Good Neck: No carotid bruits. JVD not elevated. No thyromegally Lungs: Good expansion bilaterally. without wheezes or rhonchi.  Heart: Regular rate and rhythm with S1 S2.  No S3 or S4.  No murmur, no rubs, or gallops appreciated. Abdomen: Soft, non-tender, non-distended with normoactive bowel sounds. Borderline hepatomegaly. No rebound/guarding. No obvious abdominal masses. Msk:  Strength and tone appear normal for age. No joint deformities or effusions, no spine or costo-vertebral angle tenderness. Previous right radial cath sites are well healed Extremities: No clubbing or cyanosis. No edema.  Distal pedal pulses are 2+ in 4 extrem Neuro: Alert and oriented X 3. Moves all extremities spontaneously. No focal deficits noted. Psych:  Responds to questions appropriately with a normal affect. Skin: No rashes or lesions noted  Labs:   Lab Results  Component Value Date   WBC 3.3* 12/03/2014   HGB 14.8 12/03/2014   HCT 42.3 12/03/2014   MCV 89.1  12/03/2014   PLT 133* 12/03/2014     Recent Labs Lab 12/03/14 1640  NA 136  K 4.5  CL 100  CO2 28  BUN 17  CREATININE 1.11  CALCIUM 9.6  GLUCOSE 364*    Recent Labs  12/03/14 1646  TROPIPOC 0.00   Lab Results  Component Value Date   CHOL 202* 11/25/2014   HDL 27* 11/25/2014   LDLCALC UNABLE TO CALCULATE IF TRIGLYCERIDE OVER 400 mg/dL 11/25/2014   TRIG 415* 11/25/2014    Radiology/Studies: Dg Chest 2 View 12/03/2014   CLINICAL DATA:  Left-sided chest pain.  Shortness of breath.  EXAM: CHEST  2 VIEW  COMPARISON:  11/24/2014.  FINDINGS: Mediastinum and hilar structures normal. Lungs are clear. No pleural effusion or pneumothorax. Heart size normal. Coronary artery calcification noted. No acute bony abnormality.  IMPRESSION: 1. No acute cardiopulmonary disease. 2. Coronary artery disease.   Electronically Signed   By: Marcello Moores  Register   On: 12/03/2014 17:07     Cardiac Cath: 11/26/2014 Coronary angiography: Coronary dominance: right Left mainstem: Normal Left anterior descending (LAD): There is a long segment of stent in the proximal to mid LAD. The site of repeat stenting in the proximal vessel is widely patent. The mid LAD segment is patent with less than 30% disease. In the LAD distal to the stent there is a long segment of disease up to 50%. All side branches are still patent.  Left circumflex (LCx): The LCx gives rise to a single large branching OM. The larger branch is stented and the stent is widely patent. The small branch has a 40% ostial stenosis. The distal LCx is without significant disease. Right coronary artery (RCA): There are mild irregularities.  Left ventriculography: not done. Final Conclusions:  1. Nonobstructive CAD. The prior stents are patent and all side branches are patent. The LAD distal to the stents is diffusely diseased up to 50% but is unchanged from prior angiography. The vessel at this segment is small in diameter and would be difficult to  stent. Films reviewed with Dr. Burt Knack. Recommendations: Continue medical therapy. Will check P2Y12 assay to make sure he is responding to Plavix. If not would switch to Brilinta. While he does have a mildly increased troponin there is a  flat curve that is atypical.   Echo: 11/26/2014 Conclusions - Left ventricle: The cavity size was normal. Wall thickness was normal. Systolic function was normal. The estimated ejection fraction was in the range of 55% to 60%. Wall motion was normal; there were no regional wall motion abnormalities. Left ventricular diastolic function parameters were normal. - Aortic valve: There was mild regurgitation. - Right ventricle: The cavity size was mildly dilated.  ECG: Sinus rhythm, no acute ischemic changes  ASSESSMENT AND PLAN:  Principal Problem:   Precordial chest pain - symptoms are atypical and have resolved. Initial cardiac enzymes are negative and ECG is not acute. M.D. advise on admission, cycle cardiac enzymes and discharge in a.m. if they remain negative. Continue current medical therapy and keep all follow-up appointments. Patient did not tolerate Imdur in the past, can try Ranexa for possible small vessel disease.   Jonetta Speak, PA-C 12/03/2014 7:31 PM Beeper [phone]    Personally seen and examined, agree with above. Extensive review of medical records.  Chest pain episode. Recent stent placement with repeat cardiac catheterization. Medical management. Please see cath report for details. I also do believe that anxiety may be playing a role in his symptoms as well. His wife painted a picture of worry, not being there for him, what if the 3 children find him down on the ground unconscious. He is currently taking citalopram. I suggested that he speak closer with his primary physician for further anxiolytic therapy. If troponin/cardiac markers are normal and EKG remains unremarkable, I would propose increasing his antianginal  metoprolol from 25 mg to 50 mg twice a day, blood pressure and heart rate should be able to tolerate. Possible discharge tomorrow. Could also try Ranexa as well. They were concerned about observation versus admit status and payment. I think it is reasonable for him to be observed. His occult exam is unremarkable other than obesity. Radial artery site is normal. No rubs heard on cardiac exam. Chest x-ray is normal.  Candee Furbish, MD

## 2014-12-04 ENCOUNTER — Encounter (HOSPITAL_COMMUNITY): Payer: Self-pay | Admitting: *Deleted

## 2014-12-04 DIAGNOSIS — J45909 Unspecified asthma, uncomplicated: Secondary | ICD-10-CM | POA: Diagnosis not present

## 2014-12-04 DIAGNOSIS — I10 Essential (primary) hypertension: Secondary | ICD-10-CM | POA: Diagnosis not present

## 2014-12-04 DIAGNOSIS — I2510 Atherosclerotic heart disease of native coronary artery without angina pectoris: Secondary | ICD-10-CM | POA: Diagnosis not present

## 2014-12-04 DIAGNOSIS — R079 Chest pain, unspecified: Secondary | ICD-10-CM | POA: Diagnosis not present

## 2014-12-04 LAB — TROPONIN I: Troponin I: <0.03 ng/mL (ref <0.031)

## 2014-12-04 LAB — GLUCOSE, CAPILLARY
Glucose-Capillary: 144 mg/dL — ABNORMAL HIGH (ref 70–99)
Glucose-Capillary: 202 mg/dL — ABNORMAL HIGH (ref 70–99)

## 2014-12-04 MED ORDER — METOPROLOL TARTRATE 50 MG PO TABS: 50.0000 mg | ORAL_TABLET | Freq: Two times a day (BID) | ORAL | Status: DC

## 2014-12-04 NOTE — Progress Notes (Signed)
Pt is ready for DC home accompanied by wife. Pt reports he understands all DC paperwork, follow up appointments, and changes to medications. Volunteer to take pt out.   Prescilla Sours, Therapist, sports

## 2014-12-04 NOTE — Progress Notes (Signed)
Patient Name: [name] [name] Date of Encounter: 12/04/2014  Primary Cardiologist: Dr. Domenic Polite   Principal Problem:   Precordial chest pain    SUBJECTIVE  Denies any CP or SOB. CP resolved yesterday shortly after arrived at [place] . amLODipine  2.5 mg Oral QHS  . aspirin EC  81 mg Oral QHS  . citalopram  20 mg Oral QHS  . clopidogrel  75 mg Oral QHS  . enoxaparin (LOVENOX) injection  40 mg Subcutaneous Q24H  . fluticasone  1 spray Each Nare BID  . gabapentin  400 mg Oral TID  . insulin glargine  60 Units Subcutaneous BID  . metFORMIN  1,000 mg Oral BID WC  . metoprolol tartrate  50 mg Oral BID  . mometasone-formoterol  2 puff Inhalation BID  . pantoprazole  40 mg Oral Daily  . ramipril  5 mg Oral Daily    OBJECTIVE  Filed Vitals:   12/03/14 2030 12/03/14 2151 12/04/14 0000 12/04/14 0500  BP: 147/78 169/80 141/60 133/70  Pulse: 78 86 75 67  Temp:  98.7 F (37.1 C) 98.3 F (36.8 C) 98.2 F (36.8 C)  TempSrc:  Oral Oral Oral  Resp: 17 18 18 18   Height:  5' 7"  (1.702 m)    Weight:  228 lb 11.2 oz (103.738 kg)    SpO2: 97% 97% 97% 99%    Intake/Output Summary (Last 24 hours) at 12/04/14 0807 Last data filed at 12/04/14 0030  Gross per 24 hour  Intake      0 ml  Output   1400 ml  Net  -1400 ml   Filed Weights   12/03/14 1638 12/03/14 2151  Weight: 232 lb (105.235 kg) 228 lb 11.2 oz (103.738 kg)    PHYSICAL EXAM  General: Pleasant, NAD. Neuro: Alert and oriented X 3. Moves all extremities spontaneously. Psych: Normal affect. HEENT:  Normal  Neck: Supple without bruits or JVD. Lungs:  Resp regular and unlabored, CTA. Heart: RRR no s3, s4, or murmurs. Abdomen: Soft, non-tender, non-distended, BS + x 4.  Extremities: No clubbing, cyanosis or edema. DP/PT/Radials 2+ and equal bilaterally.  Accessory Clinical Findings  CBC  Recent Labs  12/03/14 1640  WBC 3.3*  NEUTROABS 1.9  HGB 14.8  HCT 42.3  MCV 89.1  PLT 133*   Basic  Metabolic Panel  Recent Labs  12/03/14 1640  NA 136  K 4.5  CL 100  CO2 28  GLUCOSE 364*  BUN 17  CREATININE 1.11  CALCIUM 9.6   Cardiac Enzymes  Recent Labs  12/03/14 2154 12/04/14 0338  TROPONINI <0.03 <0.03    TELE NSR overnight, no ventricular ectopy    ECG  NSR with no significant ST-T wave changes  Echocardiogram 11/26/2014  LV EF: 55% -  60%  ------------------------------------------------------------------- Indications:   CAD of native vessels 414.01.  ------------------------------------------------------------------- History:  PMH:  Transient ischemic attack. Risk factors: Hypertension. Diabetes mellitus. Dyslipidemia.  ------------------------------------------------------------------- Study Conclusions  - Left ventricle: The cavity size was normal. Wall thickness was normal. Systolic function was normal. The estimated ejection fraction was in the range of 55% to 60%. Wall motion was normal; there were no regional wall motion abnormalities. Left ventricular diastolic function parameters were normal. - Aortic valve: There was mild regurgitation. - Right ventricle: The cavity size was mildly dilated.    Radiology/Studies  Dg Chest 2 View  12/03/2014   CLINICAL DATA:  Left-sided chest pain.  Shortness of breath.  EXAM: CHEST  2 VIEW  COMPARISON:  11/24/2014.  FINDINGS: Mediastinum and hilar structures normal. Lungs are clear. No pleural effusion or pneumothorax. Heart size normal. Coronary artery calcification noted. No acute bony abnormality.  IMPRESSION: 1. No acute cardiopulmonary disease. 2. Coronary artery disease.   Electronically Signed   By: Marcello Moores  Register   On: 12/03/2014 17:07   Dg Chest Portable 1 View  11/24/2014   CLINICAL DATA:  Chest pain.  EXAM: PORTABLE CHEST - 1 VIEW  COMPARISON:  04/14/2014.  FINDINGS: The cardiac silhouette remains near the upper limit of normal in size. Clear lungs with normal vascularity.  Unremarkable bones.  IMPRESSION: No acute abnormality.   Electronically Signed   By: Enrique Sack M.D.   On: 11/24/2014 16:17    ASSESSMENT AND PLAN  1. Atypical chest pain  - serial trop neg, EKG no ischemic changes  - likely anxiety related. Consider add ranexa vs increase BB according to Dr. Marlou Porch, currently on 31m BID metoprolol (ACEI held). Otherwise, likely discharge today without further ischemic workup   2. CAD  - multiple prior PCI  - cath 11/20/2014 DES to prox LAD and OM1, EF 55-60%  - recath 11/27/2014 patent prox LAD stent, 50% distal LAD lesion unchanged, patent OM1 stent.  - did not tolerate oral nitrate  - continue on ASA, plavix, BB, amlodipine  3. DM 4. OSA 5. HLD 6. HTN   Signed, MAlmyra DeforestPA-C Pager: [phone] Agree with note by HAlmyra DeforestPA-C  No further CP. Exam benign. Enz neg. OK for DC home. Pt already has an appointment with Dr. [place]omen[place]  JLorretta Harp M.D., FLa [place] FTor[facility] Center LP FLaverta BaltimoreFHindsboro[address] [place]  [id number] [phone]/26/2016 10:13 AM

## 2014-12-04 NOTE — Discharge Summary (Signed)
Discharge Summary   Patient ID: [name] [name],  MRN: [id number], DOB/AGE: 03-15-1956 59 y.o.  Admit date: 12/03/2014 Discharge date: 12/04/2014  Primary Care Provider: TAPPER,DAVID B Primary Cardiologist: Dr. Domenic Polite  Discharge Diagnoses Principal Problem:   Precordial chest pain Active Problems:   Mixed hyperlipidemia   Essential hypertension, benign   Obstructive sleep apnea   Type 2 diabetes mellitus   Anxiety   TIA (transient ischemic attack)   Coronary atherosclerosis of native coronary artery   Allergies Allergies  Allergen Reactions  . Imdur [Isosorbide Dinitrate]     Headache, severe  . Statins     Muscle aches and cramps    Hospital Course  The patient is a 59 year old male with long-standing history of CAD, anxiety, TIA, hyperlipidemia, hypertension and DM who presented to [facility] and transferred to [facility] on 12/03/2014 for chest pain. Of note, patient recently underwent cardiac catheterization on 11/20/2014 and received a drug-eluting stent to proximal LAD and OM1. EF 55-60% at the time. He underwent repeat cath on 11/27/2014 for chest pain which showed patent proximal LAD stent, 50% distal LAD lesion unchanged from the previous cath, patent OM1 stent. He has a history of intolerance to long-acting nitrate. Due to recurrent chest pain, he was transferred to [facility] from [facility]. He also endorsed diaphoresis and paleness at the time. His chest discomfort resolved shortly after arriving at [facility]. By the time he was transferred to [facility], he was chest pain-free. He was admitted to cardiology service and observed overnight. Serial troponin overnight has been negative. EKG showed no acute changes.  Given the recent cath 2 and atypical nature of his chest pain, no further ischemic workup was planned. His metoprolol was increased from 25 mg twice a day to 50 mg twice a day. He was seen the morning of  12/04/2014, at which time he denies any significant chest pain or shortness of breath. He is deemed stable for discharge from cardiology perspective. He already have a follow-up scheduled on 12/05/2014 with Dr. Domenic Polite in our [place] office.   Discharge Vitals Blood pressure 120/67, pulse 70, temperature 98.6 F (37 C), temperature source Oral, resp. rate 19, height 5' 7"  (1.702 m), weight 228 lb 11.2 oz (103.738 kg), SpO2 99 %.  Filed Weights   12/03/14 1638 12/03/14 2151  Weight: 232 lb (105.235 kg) 228 lb 11.2 oz (103.738 kg)    Labs  CBC  Recent Labs  12/03/14 1640  WBC 3.3*  NEUTROABS 1.9  HGB 14.8  HCT 42.3  MCV 89.1  PLT 914*   Basic Metabolic Panel  Recent Labs  12/03/14 1640  NA 136  K 4.5  CL 100  CO2 28  GLUCOSE 364*  BUN 17  CREATININE 1.11  CALCIUM 9.6   Cardiac Enzymes  Recent Labs  12/03/14 2154 12/04/14 0338 12/04/14 0920  TROPONINI <0.03 <0.03 <0.03   Disposition  Pt is being discharged home today in good condition.  Follow-up Plans & Appointments      Follow-up Information    Follow up with Rozann Lesches, MD On 12/05/2014.   Specialty:  Cardiology   Why:  9:20am   Contact information:   [place] [id number] [phone]       Discharge Medications    Medication List    TAKE these medications        albuterol 108 (90 BASE) MCG/ACT inhaler  Commonly known as:  PROVENTIL HFA;VENTOLIN HFA  Inhale 2 puffs into the lungs every 6 (six) hours as needed for wheezing or shortness of breath.     amLODipine 2.5 MG tablet  Commonly known as:  NORVASC  Take 1 tablet (2.5 mg total) by mouth daily.     aspirin EC 81 MG tablet  Take 81 mg by mouth at bedtime.     citalopram 20 MG tablet  Commonly known as:  CELEXA  Take 20 mg by mouth at bedtime.     clopidogrel 75 MG tablet  Commonly known as:  PLAVIX  Take 1 tablet (75 mg total) by mouth daily with breakfast.     fluticasone 50 MCG/ACT nasal spray    Commonly known as:  FLONASE  Place 1 spray into the nose 2 (two) times daily.     Fluticasone-Salmeterol 250-50 MCG/DOSE Aepb  Commonly known as:  ADVAIR  Inhale 1 puff into the lungs every 12 (twelve) hours.     gabapentin 400 MG capsule  Commonly known as:  NEURONTIN  Take 400 mg by mouth 3 (three) times daily.     insulin aspart 100 UNIT/ML injection  Commonly known as:  novoLOG  Inject 2-30 Units into the skin 3 (three) times daily before meals. Per SSI     LANTUS SOLOSTAR 100 UNIT/ML Solostar Pen  Generic drug:  Insulin Glargine  Inject 60 Units into the skin 2 (two) times daily.     Melatonin 5 MG Tabs  Take 10 mg by mouth at bedtime.     metFORMIN 1000 MG tablet  Commonly known as:  GLUCOPHAGE  Take 1 tablet (1,000 mg total) by mouth 2 (two) times daily.     metoprolol 50 MG tablet  Commonly known as:  LOPRESSOR  Take 1 tablet (50 mg total) by mouth 2 (two) times daily.     nitroGLYCERIN 0.4 MG SL tablet  Commonly known as:  NITROSTAT  Place 0.4 mg under the tongue every 5 (five) minutes as needed.     pantoprazole 40 MG tablet  Commonly known as:  PROTONIX  Take 1 tablet (40 mg total) by mouth daily.     ramipril 5 MG tablet  Commonly known as:  ALTACE  Take 5 mg by mouth daily.        Duration of Discharge Encounter   Greater than 30 minutes including physician time.  Hilbert Corrigan PA-C Pager: [phone] 12/04/2014, 11:42 AM

## 2014-12-04 NOTE — Discharge Instructions (Signed)
Chest Pain (Nonspecific) It is often hard to give a specific diagnosis for the cause of chest pain. There is always a chance that your pain could be related to something serious, such as a heart attack or a blood clot in the lungs. You need to follow up with your health care provider for further evaluation. CAUSES   Heartburn.  Pneumonia or bronchitis.  Anxiety or stress.  Inflammation around your heart (pericarditis) or lung (pleuritis or pleurisy).  A blood clot in the lung.  A collapsed lung (pneumothorax). It can develop suddenly on its own (spontaneous pneumothorax) or from trauma to the chest.  Shingles infection (herpes zoster virus). The chest wall is composed of bones, muscles, and cartilage. Any of these can be the source of the pain.  The bones can be bruised by injury.  The muscles or cartilage can be strained by coughing or overwork.  The cartilage can be affected by inflammation and become sore (costochondritis). DIAGNOSIS  Lab tests or other studies may be needed to find the cause of your pain. Your health care provider may have you take a test called an ambulatory electrocardiogram (ECG). An ECG records your heartbeat patterns over a 24-hour period. You may also have other tests, such as:  Transthoracic echocardiogram (TTE). During echocardiography, sound waves are used to evaluate how blood flows through your heart.  Transesophageal echocardiogram (TEE).  Cardiac monitoring. This allows your health care provider to monitor your heart rate and rhythm in real time.  Holter monitor. This is a portable device that records your heartbeat and can help diagnose heart arrhythmias. It allows your health care provider to track your heart activity for several days, if needed.  Stress tests by exercise or by giving medicine that makes the heart beat faster. TREATMENT   Treatment depends on what may be causing your chest pain. Treatment may include:  Acid blockers for  heartburn.  Anti-inflammatory medicine.  Pain medicine for inflammatory conditions.  Antibiotics if an infection is present.  You may be advised to change lifestyle habits. This includes stopping smoking and avoiding alcohol, caffeine, and chocolate.  You may be advised to keep your head raised (elevated) when sleeping. This reduces the chance of acid going backward from your stomach into your esophagus. Most of the time, nonspecific chest pain will improve within 2-3 days with rest and mild pain medicine.  HOME CARE INSTRUCTIONS   If antibiotics were prescribed, take them as directed. Finish them even if you start to feel better.  For the next few days, avoid physical activities that bring on chest pain. Continue physical activities as directed.  Do not use any tobacco products, including cigarettes, chewing tobacco, or electronic cigarettes.  Avoid drinking alcohol.  Only take medicine as directed by your health care provider.  Follow your health care provider's suggestions for further testing if your chest pain does not go away.  Keep any follow-up appointments you made. If you do not go to an appointment, you could develop lasting (chronic) problems with pain. If there is any problem keeping an appointment, call to reschedule. SEEK MEDICAL CARE IF:   Your chest pain does not go away, even after treatment.  You have a rash with blisters on your chest.  You have a fever. SEEK IMMEDIATE MEDICAL CARE IF:   You have increased chest pain or pain that spreads to your arm, neck, jaw, back, or abdomen.  You have shortness of breath.  You have an increasing cough, or you cough  up blood.  You have severe back or abdominal pain.  You feel nauseous or vomit.  You have severe weakness.  You faint.  You have chills. This is an emergency. Do not wait to see if the pain will go away. Get medical help at once. Call your local emergency services (911 in [place] Do not drive  yourself to the hospital. MAKE SURE YOU:   Understand these instructions.  Will watch your condition.  Will get help right away if you are not doing well or get worse. Document Released: 08/05/2005 Document Revised: 10/31/2013 Document Reviewed: 05/31/2008 [facility] Patient Information 2015 [place], [place]. This information is not intended to replace advice given to you by your health care provider. Make sure you discuss any questions you have with your health care provider.  Chest Pain (Nonspecific) It is often hard to give a specific diagnosis for the cause of chest pain. There is always a chance that your pain could be related to something serious, such as a heart attack or a blood clot in the lungs. You need to follow up with your health care provider for further evaluation. CAUSES   Heartburn.  Pneumonia or bronchitis.  Anxiety or stress.  Inflammation around your heart (pericarditis) or lung (pleuritis or pleurisy).  A blood clot in the lung.  A collapsed lung (pneumothorax). It can develop suddenly on its own (spontaneous pneumothorax) or from trauma to the chest.  Shingles infection (herpes zoster virus). The chest wall is composed of bones, muscles, and cartilage. Any of these can be the source of the pain.  The bones can be bruised by injury.  The muscles or cartilage can be strained by coughing or overwork.  The cartilage can be affected by inflammation and become sore (costochondritis). DIAGNOSIS  Lab tests or other studies may be needed to find the cause of your pain. Your health care provider may have you take a test called an ambulatory electrocardiogram (ECG). An ECG records your heartbeat patterns over a 24-hour period. You may also have other tests, such as:  Transthoracic echocardiogram (TTE). During echocardiography, sound waves are used to evaluate how blood flows through your heart.  Transesophageal echocardiogram (TEE).  Cardiac monitoring. This allows  your health care provider to monitor your heart rate and rhythm in real time.  Holter monitor. This is a portable device that records your heartbeat and can help diagnose heart arrhythmias. It allows your health care provider to track your heart activity for several days, if needed.  Stress tests by exercise or by giving medicine that makes the heart beat faster. TREATMENT   Treatment depends on what may be causing your chest pain. Treatment may include:  Acid blockers for heartburn.  Anti-inflammatory medicine.  Pain medicine for inflammatory conditions.  Antibiotics if an infection is present.  You may be advised to change lifestyle habits. This includes stopping smoking and avoiding alcohol, caffeine, and chocolate.  You may be advised to keep your head raised (elevated) when sleeping. This reduces the chance of acid going backward from your stomach into your esophagus. Most of the time, nonspecific chest pain will improve within 2-3 days with rest and mild pain medicine.  HOME CARE INSTRUCTIONS   If antibiotics were prescribed, take them as directed. Finish them even if you start to feel better.  For the next few days, avoid physical activities that bring on chest pain. Continue physical activities as directed.  Do not use any tobacco products, including cigarettes, chewing tobacco, or electronic cigarettes.  Avoid drinking alcohol.  Only take medicine as directed by your health care provider.  Follow your health care provider's suggestions for further testing if your chest pain does not go away.  Keep any follow-up appointments you made. If you do not go to an appointment, you could develop lasting (chronic) problems with pain. If there is any problem keeping an appointment, call to reschedule. SEEK MEDICAL CARE IF:   Your chest pain does not go away, even after treatment.  You have a rash with blisters on your chest.  You have a fever. SEEK IMMEDIATE MEDICAL CARE IF:     You have increased chest pain or pain that spreads to your arm, neck, jaw, back, or abdomen.  You have shortness of breath.  You have an increasing cough, or you cough up blood.  You have severe back or abdominal pain.  You feel nauseous or vomit.  You have severe weakness.  You faint.  You have chills. This is an emergency. Do not wait to see if the pain will go away. Get medical help at once. Call your local emergency services (911 in [place] Do not drive yourself to the hospital. MAKE SURE YOU:   Understand these instructions.  Will watch your condition.  Will get help right away if you are not doing well or get worse. Document Released: 08/05/2005 Document Revised: 10/31/2013 Document Reviewed: 05/31/2008 [facility] Patient Information 2015 [place], [place]. This information is not intended to replace advice given to you by your health care provider. Make sure you discuss any questions you have with your health care provider.  Chest Pain Observation It is often hard to give a specific diagnosis for the cause of chest pain. Among other possibilities your symptoms might be caused by inadequate oxygen delivery to your heart (angina). Angina that is not treated or evaluated can lead to a heart attack (myocardial infarction) or death. Blood tests, electrocardiograms, and X-rays may have been done to help determine a possible cause of your chest pain. After evaluation and observation, your health care provider has determined that it is unlikely your pain was caused by an unstable condition that requires hospitalization. However, a full evaluation of your pain may need to be completed, with additional diagnostic testing as directed. It is very important to keep your follow-up appointments. Not keeping your follow-up appointments could result in permanent heart damage, disability, or death. If there is any problem keeping your follow-up appointments, you must call your health care  provider. HOME CARE INSTRUCTIONS  Due to the slight chance that your pain could be angina, it is important to follow your health care provider's treatment plan and also maintain a healthy lifestyle:  Maintain or work toward achieving a healthy weight.  Stay physically active and exercise regularly.  Decrease your salt intake.  Eat a balanced, healthy diet. Talk to a dietitian to learn about heart-healthy foods.  Increase your fiber intake by including whole grains, vegetables, fruits, and nuts in your diet.  Avoid situations that cause stress, anger, or depression.  Take medicines as advised by your health care provider. Report any side effects to your health care provider. Do not stop medicines or adjust the dosages on your own.  Quit smoking. Do not use nicotine patches or gum until you check with your health care provider.  Keep your blood pressure, blood sugar, and cholesterol levels within normal limits.  Limit alcohol intake to no more than 1 drink per day for women who are not pregnant and  2 drinks per day for men.  Do not abuse drugs. SEEK IMMEDIATE MEDICAL CARE IF: You have severe chest pain or pressure which may include symptoms such as:  You feel pain or pressure in your arms, neck, jaw, or back.  You have severe back or abdominal pain, feel sick to your stomach (nauseous), or throw up (vomit).  You are sweating profusely.  You are having a fast or irregular heartbeat.  You feel short of breath while at rest.  You notice increasing shortness of breath during rest, sleep, or with activity.  You have chest pain that does not get better after rest or after taking your usual medicine.  You wake from sleep with chest pain.  You are unable to sleep because you cannot breathe.  You develop a frequent cough or you are coughing up blood.  You feel dizzy, faint, or experience extreme fatigue.  You develop severe weakness, dizziness, fainting, or chills. Any of  these symptoms may represent a serious problem that is an emergency. Do not wait to see if the symptoms will go away. Call your local emergency services (911 in the [place] Do not drive yourself to the hospital. MAKE SURE YOU:  Understand these instructions.  Will watch your condition.  Will get help right away if you are not doing well or get worse. Document Released: 11/28/2010 Document Revised: 10/31/2013 Document Reviewed: 04/27/2013 [facility] Patient Information 2015 [place], [place]. This information is not intended to replace advice given to you by your health care provider. Make sure you discuss any questions you have with your health care provider.

## 2014-12-04 NOTE — Progress Notes (Signed)
Inpatient Diabetes Program Recommendations  AACE/ADA: New Consensus Statement on Inpatient Glycemic Control (2013)  Target Ranges:  Prepandial:   less than 140 mg/dL      Peak postprandial:   less than 180 mg/dL (1-2 hours)      Critically ill patients:  140 - 180 mg/dL   Results for NICHOLIS, STEPANEK (MRN [id number]) as of 12/04/2014 09:53  Ref. Range  12/04/2014 07:40  Glucose-Capillary Latest Range: 70-99 mg/dL  144 (H)     Diabetes history: DM 2 Outpatient Diabetes medications: Lantus 60 units BID, Novolog 2-30 units TID with meals, Metformin 1,000 mg BID Current orders for Inpatient glycemic control: Lantus 60 units BID, Metformin 1,000 mg BID  Inpatient Diabetes Program Recommendations Correction (SSI): While patient is inpatient please place him on Novolog 0-9 units TID for correction.  Thanks, Tama Headings RN, MSN, [facility] Inpatient Diabetes Coordinator Team Pager [phone]

## 2014-12-05 ENCOUNTER — Encounter: Payer: Self-pay | Admitting: Cardiology

## 2014-12-05 ENCOUNTER — Ambulatory Visit (INDEPENDENT_AMBULATORY_CARE_PROVIDER_SITE_OTHER): Payer: Medicare Other | Admitting: Cardiology

## 2014-12-05 VITALS — BP 118/72 | HR 70 | Ht 69.0 in | Wt 234.0 lb

## 2014-12-05 DIAGNOSIS — I2510 Atherosclerotic heart disease of native coronary artery without angina pectoris: Secondary | ICD-10-CM

## 2014-12-05 DIAGNOSIS — I200 Unstable angina: Secondary | ICD-10-CM

## 2014-12-05 DIAGNOSIS — E782 Mixed hyperlipidemia: Secondary | ICD-10-CM

## 2014-12-05 DIAGNOSIS — I10 Essential (primary) hypertension: Secondary | ICD-10-CM

## 2014-12-05 NOTE — Patient Instructions (Addendum)
Your physician recommends that you schedule a follow-up appointment in: 3 months. Your physician recommends that you continue on your current medications as directed. Please refer to the Current Medication list given to you today. Please notify our office when you finish your current supply of pantoprazole so you can be switched back to omeprazole 20 mg twice daily.

## 2014-12-05 NOTE — Assessment & Plan Note (Signed)
Recent triglycerides elevated. Encouraged weight loss and exercise. Omega-3 supplements would also be considered. He has a history of statin intolerance.

## 2014-12-05 NOTE — Assessment & Plan Note (Signed)
Status post recent DES to the LAD and obtuse marginal, subsequent presentation with chest pain but no definitive ACS, and follow-up angiography demonstrating stent patency. Plan is to continue present medical therapy. I encouraged him to pursue cardiac rehabilitation, he is already scheduled to begin in February. Follow-up in 3 months.

## 2014-12-05 NOTE — Assessment & Plan Note (Signed)
Blood pressure control is good today. No adjustments were made.

## 2014-12-05 NOTE — Progress Notes (Signed)
Reason for visit: Hospital follow-up, CAD, hyperlipidemia  Clinical Summary [name] is a 59 y.o.male seen by me recently in January 2016 and referred for a cardiac catheterization with progressive angina symptoms. He underwent cardiac catheterization on January 12 showing 80-90% stenosis within the proximal to mid LAD stent, 50% stenosis at the distal stent margin, 90% mid obtuse marginal stenosis with 40-50% sidebranch stenosis, nonobstructive disease within the RCA, and LVEF in normal range. He underwent placement of DES to the LAD and obtuse marginal by Dr. [name]. Records indicate that he was subsequently readmitted to [facility] in transfer from [facility] with recurrent chest pain. He did not rule in for definitive ACS. He did undergo a follow-up cardiac catheterization to reassess stent patency. Cardiac catheterization on January 18 revealed nonobstructive disease with patent stent sites within the LAD and obtuse marginal. Medical therapy was recommended. P2Y12 function assay was adequately suppressed on Plavix, beta blocker dose was increased.  He is here with his wife today for a follow-up visit. Was actually just discharged yesterday. We reviewed his recent catheterization procedures, he is not reporting any chest pain now. Of note, he has had a number of stressors in his life, illnesses in his close family, may also have contributed to his symptoms. He reports compliance with his medications.  Recent lipid panel showed cholesterol 202, triglycerides 415, HDL 27, LDL not calculated.   Allergies  Allergen Reactions  . Imdur [Isosorbide Dinitrate]     Headache, severe  . Statins     Muscle aches and cramps    Current Outpatient Prescriptions  Medication Sig Dispense Refill  . albuterol (PROVENTIL HFA;VENTOLIN HFA) 108 (90 BASE) MCG/ACT inhaler Inhale 2 puffs into the lungs every 6 (six) hours as needed for wheezing or shortness of breath.    [name] amLODipine (NORVASC) 2.5 MG tablet  Take 1 tablet (2.5 mg total) by mouth daily. (Patient taking differently: Take 2.5 mg by mouth at bedtime. ) 90 tablet 3  . aspirin EC 81 MG tablet Take 81 mg by mouth at bedtime.     . citalopram (CELEXA) 20 MG tablet Take 20 mg by mouth at bedtime.     . clopidogrel (PLAVIX) 75 MG tablet Take 1 tablet (75 mg total) by mouth daily with breakfast. (Patient taking differently: Take 75 mg by mouth at bedtime. ) 30 tablet 11  . fluticasone (FLONASE) 50 MCG/ACT nasal spray Place 1 spray into the nose 2 (two) times daily.     . Fluticasone-Salmeterol (ADVAIR) 250-50 MCG/DOSE AEPB Inhale 1 puff into the lungs every 12 (twelve) hours.    . gabapentin (NEURONTIN) 400 MG capsule Take 400 mg by mouth 3 (three) times daily.    . insulin aspart (NOVOLOG) 100 UNIT/ML injection Inject 2-30 Units into the skin 3 (three) times daily before meals. Per SSI    . Insulin Glargine (LANTUS SOLOSTAR) 100 UNIT/ML Solostar Pen Inject 60 Units into the skin 2 (two) times daily.    . Melatonin 5 MG TABS Take 10 mg by mouth at bedtime.     . metFORMIN (GLUCOPHAGE) 1000 MG tablet Take 1 tablet (1,000 mg total) by mouth 2 (two) times daily. 60 tablet 11  . metoprolol tartrate (LOPRESSOR) 50 MG tablet Take 1 tablet (50 mg total) by mouth 2 (two) times daily. 60 tablet 5  . nitroGLYCERIN (NITROSTAT) 0.4 MG SL tablet Place 0.4 mg under the tongue every 5 (five) minutes as needed.    . pantoprazole (PROTONIX) 40 MG tablet  Take 1 tablet (40 mg total) by mouth daily. 30 tablet 11  . ramipril (ALTACE) 5 MG tablet Take 5 mg by mouth daily.     No current facility-administered medications for this visit.    Past Medical History  Diagnosis Date  . Coronary atherosclerosis of native coronary artery     a. LAD & OM stenting;  b. 2007 Cath: nonobs dzs, patent stents;  c. 07/2012 neg MV, EF 61%.  d. 11/20/14: [place] s/p  PCI w/ DES to pLAD and DES to OM1   . Asthma   . Essential hypertension, benign   . TIA (transient ischemic attack) ~  2010  . Anxiety   . Hypercholesteremia   . GERD (gastroesophageal reflux disease)   . OSA on CPAP   . Type 2 diabetes mellitus   . History of hiatal hernia   . Migraine   . Arthritis   . Lumbar herniated disc   . Chronic lower back pain   . History of gout   . Thrombocytopenia     Past Surgical History  Procedure Laterality Date  . Anterior cervical decomp/discectomy fusion  1998    "C3-4"  . Carpal tunnel release Bilateral 2005  . Shoulder arthroscopy Left ~ 2011  . Knee surgery Left 02/2012    "scraped; open"  . Neuroplasty / transposition ulnar nerve at elbow Right ~ 2012  . Left heart catheterization with coronary angiogram N/A 07/20/2012    Procedure: LEFT HEART CATHETERIZATION WITH CORONARY ANGIOGRAM;  Surgeon: [name], MD;  Location: [facility] CATH LAB;  Service: Cardiovascular;  Laterality: N/A;  . Coronary angioplasty with stent placement  2002; 2003; 11/20/2014    "I have 4 stents after today" (11/20/2014)  . Cardiac catheterization  "several"  . Coronary angioplasty    . Left heart catheterization with coronary angiogram N/A 11/20/2014    Procedure: LEFT HEART CATHETERIZATION WITH CORONARY ANGIOGRAM;  Surgeon: [name], MD;  Location: [facility] CATH LAB;  Service: Cardiovascular;  Laterality: N/A;  . Percutaneous coronary rotoblator intervention (pci-r)  11/20/2014    Procedure: PERCUTANEOUS CORONARY ROTOBLATOR INTERVENTION (PCI-R);  Surgeon: [name], MD;  Location: [facility] CATH LAB;  Service: Cardiovascular;;  . Left heart catheterization with coronary angiogram N/A 11/26/2014    Procedure: LEFT HEART CATHETERIZATION WITH CORONARY ANGIOGRAM;  Surgeon: [name], MD;  Location: [facility] CATH LAB;  Service: Cardiovascular;  Laterality: N/A;    Family History  Problem Relation Age of Onset  . Coronary artery disease Father 55    Social History Mr. Tejada reports that he has never smoked. He has never used smokeless tobacco. Mr. Shifflett reports that he does not drink  alcohol.  Review of Systems Complete review of systems negative except as otherwise outlined in the clinical summary and also the following. No palpitations or syncope. No bleeding problems.  Physical Examination Filed Vitals:   12/05/14 0932  BP: 118/72  Pulse: 70    Wt Readings from Last 3 Encounters:  12/05/14 234 lb (106.142 kg)  12/03/14 228 lb 11.2 oz (103.738 kg)  11/26/14 231 lb (104.781 kg)   Obese male, no acute distress. HEENT: Conjunctiva and lids normal, oropharynx clear. Neck: Supple, no elevated JVP or carotid bruits, no thyromegaly. Lungs: Clear to auscultation, nonlabored breathing at rest. No wheezing. Cardiac: Regular rate and rhythm, no S3 or significant systolic murmur, no pericardial rub. Abdomen: Soft, nontender, protuberant, bowel sounds present. Extremities: No pitting edema, distal pulses 2+. Skin: Warm and dry. Muscular skeletal:  No kyphosis. Neuropsychiatric: Alert and oriented 3.   Problem List and Plan   Coronary atherosclerosis of native coronary artery Status post recent DES to the LAD and obtuse marginal, subsequent presentation with chest pain but no definitive ACS, and follow-up angiography demonstrating stent patency. Plan is to continue present medical therapy. I encouraged him to pursue cardiac rehabilitation, he is already scheduled to begin in February. Follow-up in 3 months.   Essential hypertension, benign Blood pressure control is good today. No adjustments were made.   Mixed hyperlipidemia Recent triglycerides elevated. Encouraged weight loss and exercise. Omega-3 supplements would also be considered. He has a history of statin intolerance.     [name], M.D., F.A.C.C.

## 2014-12-07 ENCOUNTER — Other Ambulatory Visit: Payer: Self-pay | Admitting: Physician Assistant

## 2014-12-10 ENCOUNTER — Telehealth: Payer: Self-pay | Admitting: *Deleted

## 2014-12-10 NOTE — Telephone Encounter (Signed)
Spoke with [employer], sent metformin refills in error. plavix and metoprolol 90 day supply requested those were the only 2 we would need to fill. Primary should fill metformin. This was explained and Ailene Ravel stated she would correct this.

## 2014-12-18 ENCOUNTER — Encounter (HOSPITAL_COMMUNITY): Admission: RE | Admit: 2014-12-18 | Payer: Medicare Other | Source: Ambulatory Visit

## 2015-01-08 ENCOUNTER — Encounter (HOSPITAL_COMMUNITY): Payer: Self-pay

## 2015-01-08 ENCOUNTER — Other Ambulatory Visit: Payer: Self-pay | Admitting: Cardiology

## 2015-01-08 ENCOUNTER — Encounter (HOSPITAL_COMMUNITY)
Admission: RE | Admit: 2015-01-08 | Discharge: 2015-01-08 | Disposition: A | Discharge status: Home / Self Care | Payer: Medicare Other | Source: Ambulatory Visit | Attending: Cardiology | Admitting: Cardiology

## 2015-01-08 DIAGNOSIS — I2510 Atherosclerotic heart disease of native coronary artery without angina pectoris: Secondary | ICD-10-CM | POA: Insufficient documentation

## 2015-01-08 DIAGNOSIS — I252 Old myocardial infarction: Secondary | ICD-10-CM | POA: Insufficient documentation

## 2015-01-08 DIAGNOSIS — Z955 Presence of coronary angioplasty implant and graft: Secondary | ICD-10-CM | POA: Insufficient documentation

## 2015-01-08 NOTE — Progress Notes (Signed)
Patient referred to Cardiac Rehab by Dr. Domenic Polite due to s/p stent placement x 2, Z95.5.   Dr. Domenic Polite is his cardiologist and Dr. Scotty Court is his PCP.  During orientation advised patient on arrival and appointment times what to wear, what to do before, during and after exercise.  Reviewed attendance and class policy.  Talked about inclement weather and class consultation policy. Patient is scheduled to start cardiac Rehab on Monday,March, 7, 2016 at 0815.  Patient was advised to come to class 15 minutes before class starts.  He was also given instructions on meeting with the dietician and attending the Family Structure classes. Pt is eager to get started.  Pt was able to complete 6 minute walk test.

## 2015-01-08 NOTE — Progress Notes (Signed)
Cardiac/Pulmonary Rehab Medication Review by a Pharmacist  Does the patient  feel that his/her medications are working for him/her?  yes  Has the patient been experiencing any side effects to the medications prescribed?  No, although admits to not using sl NTG due to fear of headache.   Emphasized importance of keeping sl NTG with him and using if needed.  Assured him headache could be treated and alternative of chest pain/heart attack is more serious than headache.   Does the patient measure his/her own blood pressure or blood glucose at home?  Checks blood glucose TID.  Occasionally checks BP at father's house using his machine or at pharmacy.  He does not have machine at home.    Does the patient have any problems obtaining medications due to transportation or finances?   no  Understanding of regimen: excellent Understanding of indications: excellent Potential of compliance: excellent  Questions asked to Determine Patient Understanding of Medication Regimen:  1. What is the name of the medication?  2. What is the medication used for?  3. When should it be taken?  4. How much should be taken?  5. How will you take it?  6. What side effects should you report?  Understanding Defined as: Excellent: All questions above are correct Good: Questions 1-4 are correct Fair: Questions 1-2 are correct  Poor: 1 or none of the above questions are correct   Pharmacist comments: See comments re: NTG above.  Also patient has not increased metoprolol as prescribed b/c of issues at pharmacy and states his BP is ok on lower dose.   Biagio Borg 01/08/2015 8:38 AM

## 2015-01-08 NOTE — Progress Notes (Signed)
Pt has finished orientation and is scheduled to start CR on Monday, January 14, 2015 at 0815. Pt has been instructed to arrive to class 15 minutes early for scheduled class. Pt has been instructed to wear comfortable clothing and shoes with rubber soles. Pt has been told to take their medications 1 hour prior to coming to class.  If the patient is not going to attend class, he/she has been instructed to call.

## 2015-01-14 ENCOUNTER — Encounter (HOSPITAL_COMMUNITY)
Admission: RE | Admit: 2015-01-14 | Discharge: 2015-01-14 | Disposition: A | Discharge status: Home / Self Care | Payer: Medicare Other | Source: Ambulatory Visit | Attending: Cardiology | Admitting: Cardiology

## 2015-01-14 DIAGNOSIS — Z955 Presence of coronary angioplasty implant and graft: Secondary | ICD-10-CM | POA: Diagnosis not present

## 2015-01-14 DIAGNOSIS — I252 Old myocardial infarction: Secondary | ICD-10-CM | POA: Diagnosis not present

## 2015-01-14 DIAGNOSIS — I2510 Atherosclerotic heart disease of native coronary artery without angina pectoris: Secondary | ICD-10-CM | POA: Diagnosis present

## 2015-01-16 ENCOUNTER — Encounter (HOSPITAL_COMMUNITY): Payer: Medicare Other

## 2015-01-18 ENCOUNTER — Encounter (HOSPITAL_COMMUNITY)
Admission: RE | Admit: 2015-01-18 | Discharge: 2015-01-18 | Disposition: A | Discharge status: Home / Self Care | Payer: Medicare Other | Source: Ambulatory Visit | Attending: Cardiology | Admitting: Cardiology

## 2015-01-18 DIAGNOSIS — I2510 Atherosclerotic heart disease of native coronary artery without angina pectoris: Secondary | ICD-10-CM | POA: Diagnosis not present

## 2015-01-21 ENCOUNTER — Encounter (HOSPITAL_COMMUNITY)
Admission: RE | Admit: 2015-01-21 | Discharge: 2015-01-21 | Disposition: A | Discharge status: Home / Self Care | Payer: Medicare Other | Source: Ambulatory Visit | Attending: Cardiology | Admitting: Cardiology

## 2015-01-21 DIAGNOSIS — I2510 Atherosclerotic heart disease of native coronary artery without angina pectoris: Secondary | ICD-10-CM | POA: Diagnosis not present

## 2015-01-21 NOTE — Progress Notes (Signed)
Cardiac Rehabilitation Program Outcomes Report   Orientation:  01/08/15 Graduate Date:  tbd Discharge Date:  tbd # of sessions completed: 3  Cardiologist: Dr. Domenic Polite Family MD:  Dr. Lorette Ang Time:  0910  A.  Exercise Program:  Tolerates exercise @ 3.70 METS for 30 minutes  B.  Mental Health:  Good mental attitude  C.  Education/Instruction/Skills  Accurately checks own pulse.  Rest:  68  Exercise:  95, Knows THR for exercise and Uses Perceived Exertion Scale and/or Dyspnea Scale   D.  Nutrition/Weight Control/Body Composition:  Adherence to prescribed nutrition program: good    E.  Blood Lipids    Lab Results  Component Value Date   CHOL 202* 11/25/2014   HDL 27* 11/25/2014   LDLCALC UNABLE TO CALCULATE IF TRIGLYCERIDE OVER 400 mg/dL 11/25/2014   TRIG 415* 11/25/2014   CHOLHDL 7.5 11/25/2014    F.  Lifestyle Changes:  Making positive lifestyle changes  G.  Symptoms noted with exercise:  Asymptomatic  Report Completed By:  Felicity Coyer, RN   Comments:  First week note.

## 2015-01-23 ENCOUNTER — Encounter (HOSPITAL_COMMUNITY)
Admission: RE | Admit: 2015-01-23 | Discharge: 2015-01-23 | Disposition: A | Discharge status: Home / Self Care | Payer: Medicare Other | Source: Ambulatory Visit | Attending: Cardiology | Admitting: Cardiology

## 2015-01-23 DIAGNOSIS — I2510 Atherosclerotic heart disease of native coronary artery without angina pectoris: Secondary | ICD-10-CM | POA: Diagnosis not present

## 2015-01-25 ENCOUNTER — Encounter (HOSPITAL_COMMUNITY): Payer: Medicare Other

## 2015-01-28 ENCOUNTER — Encounter (HOSPITAL_COMMUNITY)
Admission: RE | Admit: 2015-01-28 | Discharge: 2015-01-28 | Disposition: A | Discharge status: Home / Self Care | Payer: Medicare Other | Source: Ambulatory Visit | Attending: Cardiology | Admitting: Cardiology

## 2015-01-28 DIAGNOSIS — I2510 Atherosclerotic heart disease of native coronary artery without angina pectoris: Secondary | ICD-10-CM | POA: Diagnosis not present

## 2015-01-30 ENCOUNTER — Encounter (HOSPITAL_COMMUNITY)
Admission: RE | Admit: 2015-01-30 | Discharge: 2015-01-30 | Disposition: A | Discharge status: Home / Self Care | Payer: Medicare Other | Source: Ambulatory Visit | Attending: Cardiology | Admitting: Cardiology

## 2015-01-30 DIAGNOSIS — I2510 Atherosclerotic heart disease of native coronary artery without angina pectoris: Secondary | ICD-10-CM | POA: Diagnosis not present

## 2015-02-01 ENCOUNTER — Encounter (HOSPITAL_COMMUNITY)
Admission: RE | Admit: 2015-02-01 | Discharge: 2015-02-01 | Disposition: A | Discharge status: Home / Self Care | Payer: Medicare Other | Source: Ambulatory Visit | Attending: Cardiology | Admitting: Cardiology

## 2015-02-01 DIAGNOSIS — I2510 Atherosclerotic heart disease of native coronary artery without angina pectoris: Secondary | ICD-10-CM | POA: Diagnosis not present

## 2015-02-04 ENCOUNTER — Encounter (HOSPITAL_COMMUNITY)
Admission: RE | Admit: 2015-02-04 | Discharge: 2015-02-04 | Disposition: A | Discharge status: Home / Self Care | Payer: Medicare Other | Source: Ambulatory Visit | Attending: Cardiology | Admitting: Cardiology

## 2015-02-04 DIAGNOSIS — I2510 Atherosclerotic heart disease of native coronary artery without angina pectoris: Secondary | ICD-10-CM | POA: Diagnosis not present

## 2015-02-06 ENCOUNTER — Encounter (HOSPITAL_COMMUNITY)
Admission: RE | Admit: 2015-02-06 | Discharge: 2015-02-06 | Disposition: A | Discharge status: Home / Self Care | Payer: Medicare Other | Source: Ambulatory Visit | Attending: Cardiology | Admitting: Cardiology

## 2015-02-06 DIAGNOSIS — I2510 Atherosclerotic heart disease of native coronary artery without angina pectoris: Secondary | ICD-10-CM | POA: Diagnosis not present

## 2015-02-08 ENCOUNTER — Encounter (HOSPITAL_COMMUNITY)
Admission: RE | Admit: 2015-02-08 | Discharge: 2015-02-08 | Disposition: A | Discharge status: Home / Self Care | Payer: Medicare Other | Source: Ambulatory Visit | Attending: Cardiology | Admitting: Cardiology

## 2015-02-08 DIAGNOSIS — I252 Old myocardial infarction: Secondary | ICD-10-CM | POA: Insufficient documentation

## 2015-02-08 DIAGNOSIS — I2510 Atherosclerotic heart disease of native coronary artery without angina pectoris: Secondary | ICD-10-CM | POA: Diagnosis present

## 2015-02-08 DIAGNOSIS — Z955 Presence of coronary angioplasty implant and graft: Secondary | ICD-10-CM | POA: Diagnosis not present

## 2015-02-11 ENCOUNTER — Encounter (HOSPITAL_COMMUNITY)
Admission: RE | Admit: 2015-02-11 | Discharge: 2015-02-11 | Disposition: A | Discharge status: Home / Self Care | Payer: Medicare Other | Source: Ambulatory Visit | Attending: Cardiology | Admitting: Cardiology

## 2015-02-11 DIAGNOSIS — I2510 Atherosclerotic heart disease of native coronary artery without angina pectoris: Secondary | ICD-10-CM | POA: Diagnosis not present

## 2015-02-13 ENCOUNTER — Encounter (HOSPITAL_COMMUNITY)
Admission: RE | Admit: 2015-02-13 | Discharge: 2015-02-13 | Disposition: A | Discharge status: Home / Self Care | Payer: Medicare Other | Source: Ambulatory Visit | Attending: Cardiology | Admitting: Cardiology

## 2015-02-13 DIAGNOSIS — I2510 Atherosclerotic heart disease of native coronary artery without angina pectoris: Secondary | ICD-10-CM | POA: Diagnosis not present

## 2015-02-15 ENCOUNTER — Encounter (HOSPITAL_COMMUNITY)
Admission: RE | Admit: 2015-02-15 | Discharge: 2015-02-15 | Disposition: A | Discharge status: Home / Self Care | Payer: Medicare Other | Source: Ambulatory Visit | Attending: Cardiology | Admitting: Cardiology

## 2015-02-15 DIAGNOSIS — I2510 Atherosclerotic heart disease of native coronary artery without angina pectoris: Secondary | ICD-10-CM | POA: Diagnosis not present

## 2015-02-18 ENCOUNTER — Encounter (HOSPITAL_COMMUNITY)
Admission: RE | Admit: 2015-02-18 | Discharge: 2015-02-18 | Disposition: A | Discharge status: Home / Self Care | Payer: Medicare Other | Source: Ambulatory Visit | Attending: Cardiology | Admitting: Cardiology

## 2015-02-18 DIAGNOSIS — I2510 Atherosclerotic heart disease of native coronary artery without angina pectoris: Secondary | ICD-10-CM | POA: Diagnosis not present

## 2015-02-20 ENCOUNTER — Encounter (HOSPITAL_COMMUNITY)
Admission: RE | Admit: 2015-02-20 | Discharge: 2015-02-20 | Disposition: A | Discharge status: Home / Self Care | Payer: Medicare Other | Source: Ambulatory Visit | Attending: Cardiology | Admitting: Cardiology

## 2015-02-20 DIAGNOSIS — I2510 Atherosclerotic heart disease of native coronary artery without angina pectoris: Secondary | ICD-10-CM | POA: Diagnosis not present

## 2015-02-22 ENCOUNTER — Encounter (HOSPITAL_COMMUNITY)
Admission: RE | Admit: 2015-02-22 | Discharge: 2015-02-22 | Disposition: A | Discharge status: Home / Self Care | Payer: Medicare Other | Source: Ambulatory Visit | Attending: Cardiology | Admitting: Cardiology

## 2015-02-22 DIAGNOSIS — I2510 Atherosclerotic heart disease of native coronary artery without angina pectoris: Secondary | ICD-10-CM | POA: Diagnosis not present

## 2015-02-25 ENCOUNTER — Encounter (HOSPITAL_COMMUNITY): Payer: Medicare Other

## 2015-02-27 ENCOUNTER — Encounter (HOSPITAL_COMMUNITY): Payer: Medicare Other

## 2015-03-01 ENCOUNTER — Encounter (HOSPITAL_COMMUNITY)
Admission: RE | Admit: 2015-03-01 | Discharge: 2015-03-01 | Disposition: A | Discharge status: Home / Self Care | Payer: Medicare Other | Source: Ambulatory Visit | Attending: Cardiology | Admitting: Cardiology

## 2015-03-01 DIAGNOSIS — I2510 Atherosclerotic heart disease of native coronary artery without angina pectoris: Secondary | ICD-10-CM | POA: Diagnosis not present

## 2015-03-04 ENCOUNTER — Encounter (HOSPITAL_COMMUNITY): Payer: Medicare Other

## 2015-03-06 ENCOUNTER — Encounter (HOSPITAL_COMMUNITY): Payer: Medicare Other

## 2015-03-07 ENCOUNTER — Ambulatory Visit: Payer: Medicare Other | Admitting: Cardiology

## 2015-03-08 ENCOUNTER — Encounter (HOSPITAL_COMMUNITY): Payer: Medicare Other

## 2015-03-11 ENCOUNTER — Encounter (HOSPITAL_COMMUNITY): Payer: Medicare Other

## 2015-03-13 ENCOUNTER — Encounter (HOSPITAL_COMMUNITY): Payer: Medicare Other

## 2015-03-15 ENCOUNTER — Encounter (HOSPITAL_COMMUNITY): Payer: Medicare Other

## 2015-03-18 ENCOUNTER — Encounter (HOSPITAL_COMMUNITY): Payer: Medicare Other

## 2015-03-20 ENCOUNTER — Encounter (HOSPITAL_COMMUNITY): Payer: Medicare Other

## 2015-03-20 NOTE — Addendum Note (Signed)
Encounter addended by: Cathie Olden, RN on: 03/20/2015  1:43 PM<BR>     Documentation filed: Notes Section

## 2015-03-20 NOTE — Progress Notes (Signed)
Patient is discharged from [facility] program today, March 01, 2015 with 17 sessions.  Patient had to quit the program due to family issues.  He achieved LTG of 30 minutes of aerobic exercise at max met level of 3.70.  Cardiac Rehab will make 1 month, 6 month and 1 year call backs.

## 2015-03-21 ENCOUNTER — Encounter: Payer: Self-pay | Admitting: Cardiology

## 2015-03-21 ENCOUNTER — Ambulatory Visit (INDEPENDENT_AMBULATORY_CARE_PROVIDER_SITE_OTHER): Payer: Medicare Other | Admitting: Cardiology

## 2015-03-21 VITALS — BP 128/80 | HR 77 | Ht 69.0 in | Wt 247.0 lb

## 2015-03-21 DIAGNOSIS — I10 Essential (primary) hypertension: Secondary | ICD-10-CM | POA: Diagnosis not present

## 2015-03-21 DIAGNOSIS — I2510 Atherosclerotic heart disease of native coronary artery without angina pectoris: Secondary | ICD-10-CM

## 2015-03-21 DIAGNOSIS — I200 Unstable angina: Secondary | ICD-10-CM

## 2015-03-21 DIAGNOSIS — E782 Mixed hyperlipidemia: Secondary | ICD-10-CM | POA: Diagnosis not present

## 2015-03-21 NOTE — Progress Notes (Signed)
Cardiology Office Note  Date: 03/21/2015   ID: [name] [name], [name] [name] Mar 14, 1956, MRN [id number]  PCP: [name] Lair, MD  Primary Cardiologist: [name] Lesches, MD   Chief Complaint  Patient presents with  . Coronary Artery Disease  . Hyperlipidemia    History of Present Illness: [name] [name] is a 59 y.o. male last seen in January. He presents with his wife for a follow-up visit. He just recently completed cardiac rehabilitation. Reports no angina symptoms or increasing shortness of breath, although has had some bursitis in his left foot, currently wearing a support boot. He would like to continue exercising as able.  He does tell me that he was considering elective dental procedures, tooth extractions with his dentist. I recommended that he hold off on this if possible since it is too early to stop aspirin and Plavix after his DES interventions less than 6 months ago.  He does not report any spontaneous bleeding problems. No palpitations or dizziness. Reports NYHA class II dyspnea.   Past Medical History  Diagnosis Date  . Coronary atherosclerosis of native coronary artery     a. LAD & OM stenting;  b. 2007 Cath: nonobs dzs, patent stents;  c. 07/2012 neg MV, EF 61%.  d. 11/20/14: [place] s/p  PCI w/ DES to pLAD and DES to OM1   . Asthma   . Essential hypertension, benign   . TIA (transient ischemic attack) ~ 2010  . Anxiety   . Hypercholesteremia   . GERD (gastroesophageal reflux disease)   . OSA on CPAP   . Type 2 diabetes mellitus   . History of hiatal hernia   . Migraine   . Arthritis   . Lumbar herniated disc   . Chronic lower back pain   . History of gout   . Thrombocytopenia     Current Outpatient Prescriptions  Medication Sig Dispense Refill  . albuterol (PROVENTIL HFA;VENTOLIN HFA) 108 (90 BASE) MCG/ACT inhaler Inhale 2 puffs into the lungs every 6 (six) hours as needed for wheezing or shortness of breath.    [name] amLODipine (NORVASC) 2.5 MG tablet Take 1 tablet  (2.5 mg total) by mouth daily. (Patient taking differently: Take 2.5 mg by mouth at bedtime. ) 90 tablet 3  . aspirin EC 81 MG tablet Take 81 mg by mouth at bedtime.     . citalopram (CELEXA) 20 MG tablet Take 20 mg by mouth at bedtime.     . clopidogrel (PLAVIX) 75 MG tablet Take 75 mg by mouth at bedtime.    . fluticasone (FLONASE) 50 MCG/ACT nasal spray Place 1 spray into the nose 2 (two) times daily.     . Fluticasone-Salmeterol (ADVAIR) 250-50 MCG/DOSE AEPB Inhale 1 puff into the lungs every 12 (twelve) hours.    . gabapentin (NEURONTIN) 400 MG capsule Take 400 mg by mouth 3 (three) times daily.    . insulin aspart (NOVOLOG) 100 UNIT/ML injection Inject 2-30 Units into the skin 3 (three) times daily before meals. Per SSI    . Insulin Glargine (LANTUS SOLOSTAR) 100 UNIT/ML Solostar Pen Inject 60 Units into the skin 2 (two) times daily.    . Melatonin 5 MG TABS Take 10 mg by mouth at bedtime.     . metFORMIN (GLUCOPHAGE) 1000 MG tablet TAKE 1 TABLET BY MOUTH TWICE DAILY 60 tablet 11  . metoprolol (LOPRESSOR) 50 MG tablet Take 50 mg by mouth daily.     . nitroGLYCERIN (NITROSTAT) 0.4 MG SL tablet Place 0.4  mg under the tongue every 5 (five) minutes as needed for chest pain.     . pantoprazole (PROTONIX) 20 MG tablet Take 20 mg by mouth 2 (two) times daily before a meal.    . ramipril (ALTACE) 5 MG tablet Take 5 mg by mouth at bedtime.      No current facility-administered medications for this visit.    Allergies:  Imdur; Statins; and Tricor   Social History: The patient  reports that he has never smoked. He has never used smokeless tobacco. He reports that he does not drink alcohol or use illicit drugs.   ROS:  Please see the history of present illness. Otherwise, complete review of systems is positive for none.  All other systems are reviewed and negative.   Physical Exam: VS:  BP 128/80 mmHg  Pulse 77  Ht 5' 9"  (1.753 m)  Wt 247 lb (112.038 kg)  BMI 36.46 kg/m2  SpO2 94%, BMI Body  mass index is 36.46 kg/(m^2).  Wt Readings from Last 3 Encounters:  03/21/15 247 lb (112.038 kg)  12/05/14 234 lb (106.142 kg)  12/03/14 228 lb 11.2 oz (103.738 kg)     Obese male, no acute distress. HEENT: Conjunctiva and lids normal, oropharynx clear. Neck: Supple, no elevated JVP or carotid bruits, no thyromegaly. Lungs: Clear to auscultation, nonlabored breathing at rest. No wheezing. Cardiac: Regular rate and rhythm, no S3 or significant systolic murmur, no pericardial rub. Abdomen: Soft, nontender, protuberant, bowel sounds present. Extremities: No pitting edema, distal pulses 2+. Skin: Warm and dry. Muscular skeletal: No kyphosis. Neuropsychiatric: Alert and oriented 3.   ECG: ECG is not ordered today.   Recent Labwork: 12/03/2014: B Natriuretic Peptide 10.6; BUN 17; Creatinine 1.11; Hemoglobin 14.8; Platelets 133*; Potassium 4.5; Sodium 136   Other Studies Reviewed Today:  1. Follow-up cardiac catheterization 11/26/2014: Left mainstem: Normal  Left anterior descending (LAD): There is a long segment of stent in the proximal to mid LAD. The site of repeat stenting in the proximal vessel is widely patent. The mid LAD segment is patent with less than 30% disease. In the LAD distal to the stent there is a long segment of disease up to 50%. All side branches are still patent.   Left circumflex (LCx): The LCx gives rise to a single large branching OM. The larger branch is stented and the stent is widely patent. The small branch has a 40% ostial stenosis. The distal LCx is without significant disease.  Right coronary artery (RCA): There are mild irregularities.   Left ventriculography: not done.  2. Echocardiogram 11/26/2014: Study Conclusions  - Left ventricle: The cavity size was normal. Wall thickness was normal. Systolic function was normal. The estimated ejection fraction was in the range of 55% to 60%. Wall motion was normal; there were no regional wall motion  abnormalities. Left ventricular diastolic function parameters were normal. - Aortic valve: There was mild regurgitation. - Right ventricle: The cavity size was mildly dilated.   Assessment and Plan:  1. Symptomatically stable CAD with history of DES to the LAD and obtuse marginal back in January of this year. He has completed cardiac rehabilitation. Continue current regimen including both aspirin and Plavix for anticipated one-year duration.  2. Essential hypertension, blood pressure control is good today.  3. Mixed hyperlipidemia with elevated triglycerides on last assessment. Recommended omega-3 supplements. He will need to have follow-up lipid panel around the time of his next visit if not sooner with Dr. Scotty Court.  Current medicines were reviewed  with the patient today.  Disposition: FU with me in 6 months.   Signed, [name], MD, [facility] 03/21/2015 9:47 AM    [place] [facility] [place], [place], [place] [address] Phone: [phone]; Fax: [phone]

## 2015-03-21 NOTE — Patient Instructions (Signed)
Your physician recommends that you continue on your current medications as directed. Please refer to the Current Medication list given to you today. Your physician recommends that you schedule a follow-up appointment in: 6 months. You will receive a reminder letter in the mail in about 4 months reminding you to call and schedule your appointment. If you don't receive this letter, please contact our office. 

## 2015-03-22 ENCOUNTER — Encounter (HOSPITAL_COMMUNITY): Payer: Medicare Other

## 2015-03-25 ENCOUNTER — Encounter (HOSPITAL_COMMUNITY): Payer: Medicare Other

## 2015-03-27 ENCOUNTER — Encounter (HOSPITAL_COMMUNITY): Payer: Medicare Other

## 2015-03-29 ENCOUNTER — Encounter (HOSPITAL_COMMUNITY): Payer: Medicare Other

## 2015-04-01 ENCOUNTER — Encounter (HOSPITAL_COMMUNITY): Payer: Medicare Other

## 2015-04-03 ENCOUNTER — Encounter (HOSPITAL_COMMUNITY): Payer: Medicare Other

## 2015-04-05 ENCOUNTER — Encounter (HOSPITAL_COMMUNITY): Admission: RE | Admit: 2015-04-05 | Payer: Medicare Other | Source: Ambulatory Visit

## 2015-06-04 ENCOUNTER — Other Ambulatory Visit: Payer: Self-pay

## 2015-06-04 ENCOUNTER — Other Ambulatory Visit: Payer: Self-pay | Admitting: *Deleted

## 2015-06-04 MED ORDER — METOPROLOL TARTRATE 50 MG PO TABS: 50.0000 mg | ORAL_TABLET | Freq: Every day | ORAL | Status: DC

## 2015-06-04 NOTE — Telephone Encounter (Signed)
Metoprolol refilled today.

## 2015-09-06 ENCOUNTER — Telehealth: Payer: Self-pay | Admitting: *Deleted

## 2015-09-08 NOTE — Telephone Encounter (Signed)
Not sure what you mean. Patient was last seen in May and note in EPIC under same number. Will forward to administration and they can work to resolve this issue with you.

## 2015-09-09 ENCOUNTER — Telehealth: Payer: Self-pay | Admitting: *Deleted

## 2015-09-18 NOTE — Telephone Encounter (Signed)
Sent to appropriate person to handle this

## 2015-09-24 ENCOUNTER — Encounter: Payer: Self-pay | Admitting: Cardiology

## 2015-09-24 ENCOUNTER — Encounter: Payer: Medicare Other | Admitting: Cardiology

## 2015-09-24 DIAGNOSIS — R0989 Other specified symptoms and signs involving the circulatory and respiratory systems: Secondary | ICD-10-CM

## 2015-09-24 NOTE — Progress Notes (Signed)
No show  This encounter was created in error - please disregard.

## 2015-10-29 ENCOUNTER — Emergency Department (HOSPITAL_COMMUNITY)
Admission: EM | Admit: 2015-10-29 | Discharge: 2015-10-29 | Disposition: A | Discharge status: Home / Self Care | Payer: Medicare Other | Attending: Emergency Medicine | Admitting: Emergency Medicine

## 2015-10-29 ENCOUNTER — Encounter (HOSPITAL_COMMUNITY): Payer: Self-pay

## 2015-10-29 ENCOUNTER — Emergency Department (HOSPITAL_COMMUNITY): Payer: Medicare Other

## 2015-10-29 DIAGNOSIS — Z7984 Long term (current) use of oral hypoglycemic drugs: Secondary | ICD-10-CM | POA: Insufficient documentation

## 2015-10-29 DIAGNOSIS — Z794 Long term (current) use of insulin: Secondary | ICD-10-CM | POA: Diagnosis not present

## 2015-10-29 DIAGNOSIS — Z8673 Personal history of transient ischemic attack (TIA), and cerebral infarction without residual deficits: Secondary | ICD-10-CM | POA: Insufficient documentation

## 2015-10-29 DIAGNOSIS — R531 Weakness: Secondary | ICD-10-CM

## 2015-10-29 DIAGNOSIS — M109 Gout, unspecified: Secondary | ICD-10-CM | POA: Insufficient documentation

## 2015-10-29 DIAGNOSIS — Z862 Personal history of diseases of the blood and blood-forming organs and certain disorders involving the immune mechanism: Secondary | ICD-10-CM | POA: Diagnosis not present

## 2015-10-29 DIAGNOSIS — I252 Old myocardial infarction: Secondary | ICD-10-CM | POA: Diagnosis not present

## 2015-10-29 DIAGNOSIS — J45909 Unspecified asthma, uncomplicated: Secondary | ICD-10-CM | POA: Diagnosis not present

## 2015-10-29 DIAGNOSIS — Z7982 Long term (current) use of aspirin: Secondary | ICD-10-CM | POA: Insufficient documentation

## 2015-10-29 DIAGNOSIS — I2510 Atherosclerotic heart disease of native coronary artery without angina pectoris: Secondary | ICD-10-CM | POA: Diagnosis not present

## 2015-10-29 DIAGNOSIS — Z79899 Other long term (current) drug therapy: Secondary | ICD-10-CM | POA: Insufficient documentation

## 2015-10-29 DIAGNOSIS — G43909 Migraine, unspecified, not intractable, without status migrainosus: Secondary | ICD-10-CM | POA: Diagnosis not present

## 2015-10-29 DIAGNOSIS — Z7902 Long term (current) use of antithrombotics/antiplatelets: Secondary | ICD-10-CM | POA: Diagnosis not present

## 2015-10-29 DIAGNOSIS — E119 Type 2 diabetes mellitus without complications: Secondary | ICD-10-CM | POA: Insufficient documentation

## 2015-10-29 DIAGNOSIS — M6281 Muscle weakness (generalized): Secondary | ICD-10-CM | POA: Diagnosis not present

## 2015-10-29 DIAGNOSIS — K219 Gastro-esophageal reflux disease without esophagitis: Secondary | ICD-10-CM | POA: Diagnosis not present

## 2015-10-29 DIAGNOSIS — Z9861 Coronary angioplasty status: Secondary | ICD-10-CM | POA: Insufficient documentation

## 2015-10-29 DIAGNOSIS — M1990 Unspecified osteoarthritis, unspecified site: Secondary | ICD-10-CM | POA: Insufficient documentation

## 2015-10-29 DIAGNOSIS — F419 Anxiety disorder, unspecified: Secondary | ICD-10-CM | POA: Diagnosis not present

## 2015-10-29 DIAGNOSIS — G8929 Other chronic pain: Secondary | ICD-10-CM | POA: Insufficient documentation

## 2015-10-29 DIAGNOSIS — G4733 Obstructive sleep apnea (adult) (pediatric): Secondary | ICD-10-CM | POA: Diagnosis not present

## 2015-10-29 DIAGNOSIS — R200 Anesthesia of skin: Secondary | ICD-10-CM | POA: Diagnosis present

## 2015-10-29 DIAGNOSIS — I10 Essential (primary) hypertension: Secondary | ICD-10-CM | POA: Insufficient documentation

## 2015-10-29 HISTORY — DX: Old myocardial infarction: I25.2

## 2015-10-29 LAB — CBC
HEMATOCRIT: 41.7 % (ref 39.0–52.0)
Hemoglobin: 15 g/dL (ref 13.0–17.0)
MCH: 32.3 pg (ref 26.0–34.0)
MCHC: 36 g/dL (ref 30.0–36.0)
MCV: 89.9 fL (ref 78.0–100.0)
PLATELETS: 113 10*3/uL — AB (ref 150–400)
RBC: 4.64 MIL/uL (ref 4.22–5.81)
RDW: 12.7 % (ref 11.5–15.5)
WBC: 4 10*3/uL (ref 4.0–10.5)

## 2015-10-29 LAB — URINALYSIS, ROUTINE W REFLEX MICROSCOPIC
Bilirubin Urine: NEGATIVE
GLUCOSE, UA: NEGATIVE mg/dL
Hgb urine dipstick: NEGATIVE
Ketones, ur: NEGATIVE mg/dL
LEUKOCYTES UA: NEGATIVE
Nitrite: NEGATIVE
PH: 5.5 (ref 5.0–8.0)
PROTEIN: NEGATIVE mg/dL
Specific Gravity, Urine: 1.025 (ref 1.005–1.030)

## 2015-10-29 LAB — DIFFERENTIAL
BASOS PCT: 0 %
Basophils Absolute: 0 10*3/uL (ref 0.0–0.1)
EOS ABS: 0.1 10*3/uL (ref 0.0–0.7)
Eosinophils Relative: 3 %
LYMPHS ABS: 1.4 10*3/uL (ref 0.7–4.0)
LYMPHS PCT: 35 %
Monocytes Absolute: 0.2 10*3/uL (ref 0.1–1.0)
Monocytes Relative: 6 %
NEUTROS ABS: 2.3 10*3/uL (ref 1.7–7.7)
Neutrophils Relative %: 56 %

## 2015-10-29 LAB — COMPREHENSIVE METABOLIC PANEL
ALT: 59 U/L (ref 17–63)
ANION GAP: 10 (ref 5–15)
AST: 64 U/L — ABNORMAL HIGH (ref 15–41)
Albumin: 4.1 g/dL (ref 3.5–5.0)
Alkaline Phosphatase: 44 U/L (ref 38–126)
BUN: 20 mg/dL (ref 6–20)
CALCIUM: 9.6 mg/dL (ref 8.9–10.3)
CHLORIDE: 100 mmol/L — AB (ref 101–111)
CO2: 25 mmol/L (ref 22–32)
CREATININE: 1.17 mg/dL (ref 0.61–1.24)
Glucose, Bld: 190 mg/dL — ABNORMAL HIGH (ref 65–99)
Potassium: 4.1 mmol/L (ref 3.5–5.1)
Sodium: 135 mmol/L (ref 135–145)
Total Bilirubin: 0.6 mg/dL (ref 0.3–1.2)
Total Protein: 7.5 g/dL (ref 6.5–8.1)

## 2015-10-29 LAB — ETHANOL: Alcohol, Ethyl (B): <5 mg/dL (ref <5)

## 2015-10-29 LAB — RAPID URINE DRUG SCREEN, HOSP PERFORMED
Amphetamines: NOT DETECTED
BENZODIAZEPINES: NOT DETECTED
Barbiturates: NOT DETECTED
COCAINE: NOT DETECTED
OPIATES: NOT DETECTED
TETRAHYDROCANNABINOL: NOT DETECTED

## 2015-10-29 LAB — TROPONIN I

## 2015-10-29 NOTE — ED Notes (Signed)
MD at bedside. 

## 2015-10-29 NOTE — ED Notes (Addendum)
Pt reports headache, left sided numbness since last night at 2000. Wife reports intermittent confusion today. Pt answers questions appropriately. Alert, verbal, oriented x4.  Pt seen by his PCP this morning for "routine exam."

## 2015-10-29 NOTE — ED Provider Notes (Signed)
CSN: [id number]     Arrival date & time 10/29/15  1600 History   First MD Initiated Contact with Patient 10/29/15 1627     Chief Complaint  Patient presents with  . Numbness      HPI Patient presents to the emergency department with left-sided arm and leg weakness.  He's had mild occasional confusion today as well.  He reports that over the last month he's been having some left-sided arm and leg weakness which waxes and wanes.  It is not associated with pain.  He denies neck pain or back pain.  He reports occasional headaches.  He has no prior history of stroke.  He does have a prior history of obstructive coronary disease which required PCI.  He has no chest pain at this time.  He denies shortness of breath.  Patient was seen by his primary care physician today however he did not want to mention any of the left-sided symptoms.  His wife finally convinced him to come to the ER to have these evaluated.  He's had some occasional near falls secondary to the weakness on left side.   Past Medical History  Diagnosis Date  . Coronary atherosclerosis of native coronary artery     a. LAD & OM stenting;  b. 2007 Cath: nonobs dzs, patent stents;  c. 07/2012 neg MV, EF 61%.  d. 11/20/14: [place] s/p  PCI w/ DES to pLAD and DES to OM1   . Asthma   . Essential hypertension, benign   . TIA (transient ischemic attack) ~ 2010  . Anxiety   . Hypercholesteremia   . GERD (gastroesophageal reflux disease)   . OSA on CPAP   . Type 2 diabetes mellitus ([place])   . History of hiatal hernia   . Migraine   . Arthritis   . Lumbar herniated disc   . Chronic lower back pain   . History of gout   . Thrombocytopenia ([place])   . MI, old    Past Surgical History  Procedure Laterality Date  . Anterior cervical decomp/discectomy fusion  1998    "C3-4"  . Carpal tunnel release Bilateral 2005  . Shoulder arthroscopy Left ~ 2011  . Knee surgery Left 02/2012    "scraped; open"  . Neuroplasty / transposition ulnar nerve at  elbow Right ~ 2012  . Left heart catheterization with coronary angiogram N/A 07/20/2012    Procedure: LEFT HEART CATHETERIZATION WITH CORONARY ANGIOGRAM;  Surgeon: [name], MD;  Location: [facility] CATH LAB;  Service: Cardiovascular;  Laterality: N/A;  . Coronary angioplasty with stent placement  2002; 2003; 11/20/2014    "I have 4 stents after today" (11/20/2014)  . Cardiac catheterization  "several"  . Coronary angioplasty    . Left heart catheterization with coronary angiogram N/A 11/20/2014    Procedure: LEFT HEART CATHETERIZATION WITH CORONARY ANGIOGRAM;  Surgeon: [name], MD;  Location: [facility] CATH LAB;  Service: Cardiovascular;  Laterality: N/A;  . Percutaneous coronary rotoblator intervention (pci-r)  11/20/2014    Procedure: PERCUTANEOUS CORONARY ROTOBLATOR INTERVENTION (PCI-R);  Surgeon: [name], MD;  Location: [facility] CATH LAB;  Service: Cardiovascular;;  . Left heart catheterization with coronary angiogram N/A 11/26/2014    Procedure: LEFT HEART CATHETERIZATION WITH CORONARY ANGIOGRAM;  Surgeon: [name], MD;  Location: [facility] CATH LAB;  Service: Cardiovascular;  Laterality: N/A;   Family History  Problem Relation Age of Onset  . Coronary artery disease Father 62   Social History  Substance Use Topics  .  Smoking status: Never Smoker   . Smokeless tobacco: Never Used  . Alcohol Use: No    Review of Systems  All other systems reviewed and are negative.     Allergies  Imdur; Statins; and Tricor  Home Medications   Prior to Admission medications   Medication Sig Start Date End Date Taking? Authorizing Provider  albuterol (PROVENTIL HFA;VENTOLIN HFA) 108 (90 BASE) MCG/ACT inhaler Inhale 2 puffs into the lungs every 6 (six) hours as needed for wheezing or shortness of breath.   Yes Historical Provider, MD  amLODipine (NORVASC) 2.5 MG tablet Take 1 tablet (2.5 mg total) by mouth daily. Patient taking differently: Take 2.5 mg by mouth at bedtime.  11/16/14  Yes [name], MD  aspirin EC 81 MG tablet Take 81 mg by mouth at bedtime.    Yes Historical Provider, MD  citalopram (CELEXA) 20 MG tablet Take 20 mg by mouth at bedtime.    Yes Historical Provider, MD  clopidogrel (PLAVIX) 75 MG tablet Take 75 mg by mouth at bedtime.   Yes Historical Provider, MD  fluticasone (FLONASE) 50 MCG/ACT nasal spray Place 1 spray into the nose 2 (two) times daily.    Yes Historical Provider, MD  Fluticasone-Salmeterol (ADVAIR) 250-50 MCG/DOSE AEPB Inhale 1 puff into the lungs every 12 (twelve) hours.   Yes Historical Provider, MD  gabapentin (NEURONTIN) 400 MG capsule Take 400 mg by mouth 3 (three) times daily.   Yes Historical Provider, MD  insulin aspart (NOVOLOG) 100 UNIT/ML injection Inject 2-30 Units into the skin 3 (three) times daily before meals. Per SSI   Yes Historical Provider, MD  Insulin Glargine (LANTUS SOLOSTAR) 100 UNIT/ML Solostar Pen Inject 60 Units into the skin 2 (two) times daily.   Yes Historical Provider, MD  Melatonin 5 MG TABS Take 10 mg by mouth at bedtime.    Yes Historical Provider, MD  metFORMIN (GLUCOPHAGE) 1000 MG tablet TAKE 1 TABLET BY MOUTH TWICE DAILY 12/10/14  Yes [name], MD  metoprolol (LOPRESSOR) 50 MG tablet Take 1 tablet (50 mg total) by mouth daily. 06/04/15  Yes [name], MD  nitroGLYCERIN (NITROSTAT) 0.4 MG SL tablet Place 0.4 mg under the tongue every 5 (five) minutes as needed for chest pain.    Yes Historical Provider, MD  pantoprazole (PROTONIX) 20 MG tablet Take 20 mg by mouth 2 (two) times daily before a meal.   Yes Historical Provider, MD  ramipril (ALTACE) 5 MG tablet Take 5 mg by mouth at bedtime.    Yes Historical Provider, MD   BP 150/89 mmHg  Pulse 65  Temp(Src) 98.4 F (36.9 C) (Oral)  Resp 12  Ht 5' 9"  (1.753 m)  Wt 235 lb (106.595 kg)  BMI 34.69 kg/m2  SpO2 100% Physical Exam  Constitutional: He is oriented to person, place, and time. He appears well-developed and well-nourished.  HENT:   Head: Normocephalic and atraumatic.  Eyes: EOM are normal.  Neck: Normal range of motion.  Cardiovascular: Normal rate, regular rhythm, normal heart sounds and intact distal pulses.   Pulmonary/Chest: Effort normal and breath sounds normal. No respiratory distress.  Abdominal: Soft. He exhibits no distension. There is no tenderness.  Musculoskeletal: Normal range of motion.  Neurological: He is alert and oriented to person, place, and time.  Very mild weakness of his left lower extremity as compared to his right.  Normal strength in his bilateral major muscle groups of his upper extremities.  Normal finger to nose bilaterally.  Speech normal.  Gait not tested  Skin: Skin is warm and dry.  Psychiatric: He has a normal mood and affect. Judgment normal.  Nursing note and vitals reviewed.   ED Course  Procedures (including critical care time) Labs Review Labs Reviewed  CBC - Abnormal; Notable for the following:    Platelets 113 (*)    All other components within normal limits  COMPREHENSIVE METABOLIC PANEL - Abnormal; Notable for the following:    Chloride 100 (*)    Glucose, Bld 190 (*)    AST 64 (*)    All other components within normal limits  ETHANOL  DIFFERENTIAL  URINE RAPID DRUG SCREEN, HOSP PERFORMED  URINALYSIS, ROUTINE W REFLEX MICROSCOPIC (NOT AT [facility])  TROPONIN I    Imaging Review Mr Brain Wo Contrast  10/29/2015  CLINICAL DATA:  Left arm and leg weakness with confusion and headache today. EXAM: MRI HEAD WITHOUT CONTRAST TECHNIQUE: Multiplanar, multiecho pulse sequences of the brain and surrounding structures were obtained without intravenous contrast. COMPARISON:  07/22/2012 FINDINGS: The study is mildly motion degraded. There is no evidence of acute infarct, intracranial hemorrhage, mass, midline shift, or extra-axial fluid collection. Ventricles and sulci are normal for age. Small foci of T2 hyperintensity in the cerebral white matter are similar to the prior MRI and  nonspecific but compatible with minimal chronic small vessel ischemic disease. There is a new, chronic lacunar infarct in the left corona radiata. Orbits are unremarkable. Minimal paranasal sinus mucosal thickening and a trace left mastoid effusion are noted. Major intracranial vascular flow voids are preserved. IMPRESSION: 1. Mildly motion degraded examination without evidence of acute intracranial abnormality. 2. Interval chronic lacunar infarct in the left corona radiata. Electronically Signed   By: Logan Bores M.D.   On: 10/29/2015 18:31   I have personally reviewed and evaluated these images and lab results as part of my medical decision-making.   EKG Interpretation   Date/Time:  Tuesday October 29 2015 16:10:29 EST Ventricular Rate:  68 PR Interval:  141 QRS Duration: 87 QT Interval:  399 QTC Calculation: 424 R Axis:   5 Text Interpretation:  Sinus rhythm No significant change was found \E\  Confirmed by Harvest Stanco  MD, Lennette Bihari ([id number]) on 10/29/2015 4:39:39 PM      MDM   Final diagnoses:  Left-sided weakness    Patient without acute infarct on MRI.  Patient does have a chronic lacunar infarct on the left.  He has no pathology noted on the right brain on MRI to suggest cause for his left-sided weakness.  His left-sided symptoms seem to be waxing and waning.  He has been referred to his primary care physician as well as neurology for ongoing outpatient workup.  I do not think he needs additional workup in the emergency department tonight.  He does not need admission to the hospital either.  He has wife understand to return to the ER for any new or worsening symptoms.  They understand the importance of ongoing primary care neurology workup.    Jola Schmidt, MD 10/29/15 [phone]

## 2015-10-29 NOTE — ED Notes (Signed)
MD Venora Maples made aware

## 2015-11-04 ENCOUNTER — Other Ambulatory Visit: Payer: Self-pay | Admitting: Cardiology

## 2015-11-10 DIAGNOSIS — I252 Old myocardial infarction: Secondary | ICD-10-CM

## 2015-11-10 HISTORY — DX: Old myocardial infarction: I25.2

## 2015-11-22 ENCOUNTER — Encounter: Payer: Medicare Other | Admitting: Cardiology

## 2015-11-22 ENCOUNTER — Encounter: Payer: Self-pay | Admitting: Cardiology

## 2015-11-22 NOTE — Progress Notes (Signed)
No show  This encounter was created in error - please disregard.

## 2015-12-23 ENCOUNTER — Encounter (HOSPITAL_COMMUNITY): Payer: Self-pay | Admitting: *Deleted

## 2015-12-23 ENCOUNTER — Other Ambulatory Visit (HOSPITAL_COMMUNITY): Payer: Self-pay

## 2015-12-23 ENCOUNTER — Other Ambulatory Visit: Payer: Self-pay

## 2015-12-23 ENCOUNTER — Observation Stay (HOSPITAL_COMMUNITY)
Admission: EM | Admit: 2015-12-23 | Discharge: 2015-12-24 | Disposition: A | Discharge status: Home / Self Care | Payer: Medicare Other | Attending: Internal Medicine | Admitting: Internal Medicine

## 2015-12-23 ENCOUNTER — Emergency Department (HOSPITAL_COMMUNITY): Payer: Medicare Other

## 2015-12-23 DIAGNOSIS — Z7984 Long term (current) use of oral hypoglycemic drugs: Secondary | ICD-10-CM | POA: Diagnosis not present

## 2015-12-23 DIAGNOSIS — Z7902 Long term (current) use of antithrombotics/antiplatelets: Secondary | ICD-10-CM | POA: Insufficient documentation

## 2015-12-23 DIAGNOSIS — E119 Type 2 diabetes mellitus without complications: Secondary | ICD-10-CM | POA: Insufficient documentation

## 2015-12-23 DIAGNOSIS — D696 Thrombocytopenia, unspecified: Secondary | ICD-10-CM | POA: Insufficient documentation

## 2015-12-23 DIAGNOSIS — Z7982 Long term (current) use of aspirin: Secondary | ICD-10-CM | POA: Insufficient documentation

## 2015-12-23 DIAGNOSIS — R110 Nausea: Secondary | ICD-10-CM | POA: Insufficient documentation

## 2015-12-23 DIAGNOSIS — Z9889 Other specified postprocedural states: Secondary | ICD-10-CM | POA: Insufficient documentation

## 2015-12-23 DIAGNOSIS — E1165 Type 2 diabetes mellitus with hyperglycemia: Secondary | ICD-10-CM

## 2015-12-23 DIAGNOSIS — J45901 Unspecified asthma with (acute) exacerbation: Secondary | ICD-10-CM | POA: Insufficient documentation

## 2015-12-23 DIAGNOSIS — E1169 Type 2 diabetes mellitus with other specified complication: Secondary | ICD-10-CM

## 2015-12-23 DIAGNOSIS — I2510 Atherosclerotic heart disease of native coronary artery without angina pectoris: Secondary | ICD-10-CM | POA: Diagnosis not present

## 2015-12-23 DIAGNOSIS — Z8673 Personal history of transient ischemic attack (TIA), and cerebral infarction without residual deficits: Secondary | ICD-10-CM | POA: Diagnosis not present

## 2015-12-23 DIAGNOSIS — G4733 Obstructive sleep apnea (adult) (pediatric): Secondary | ICD-10-CM | POA: Insufficient documentation

## 2015-12-23 DIAGNOSIS — Z794 Long term (current) use of insulin: Secondary | ICD-10-CM | POA: Diagnosis not present

## 2015-12-23 DIAGNOSIS — I10 Essential (primary) hypertension: Secondary | ICD-10-CM | POA: Insufficient documentation

## 2015-12-23 DIAGNOSIS — G43909 Migraine, unspecified, not intractable, without status migrainosus: Secondary | ICD-10-CM | POA: Diagnosis not present

## 2015-12-23 DIAGNOSIS — F419 Anxiety disorder, unspecified: Secondary | ICD-10-CM | POA: Diagnosis not present

## 2015-12-23 DIAGNOSIS — R079 Chest pain, unspecified: Principal | ICD-10-CM | POA: Diagnosis present

## 2015-12-23 DIAGNOSIS — R072 Precordial pain: Secondary | ICD-10-CM | POA: Diagnosis present

## 2015-12-23 DIAGNOSIS — K219 Gastro-esophageal reflux disease without esophagitis: Secondary | ICD-10-CM | POA: Insufficient documentation

## 2015-12-23 DIAGNOSIS — G8929 Other chronic pain: Secondary | ICD-10-CM | POA: Diagnosis not present

## 2015-12-23 DIAGNOSIS — E7800 Pure hypercholesterolemia, unspecified: Secondary | ICD-10-CM | POA: Diagnosis not present

## 2015-12-23 DIAGNOSIS — Z9861 Coronary angioplasty status: Secondary | ICD-10-CM | POA: Diagnosis not present

## 2015-12-23 DIAGNOSIS — I252 Old myocardial infarction: Secondary | ICD-10-CM | POA: Diagnosis not present

## 2015-12-23 LAB — BASIC METABOLIC PANEL
Anion gap: 8 (ref 5–15)
BUN: 19 mg/dL (ref 6–20)
CHLORIDE: 105 mmol/L (ref 101–111)
CO2: 27 mmol/L (ref 22–32)
CREATININE: 0.85 mg/dL (ref 0.61–1.24)
Calcium: 9 mg/dL (ref 8.9–10.3)
GFR calc non Af Amer: >60 mL/min (ref >60)
Glucose, Bld: 251 mg/dL — ABNORMAL HIGH (ref 65–99)
POTASSIUM: 4.1 mmol/L (ref 3.5–5.1)
SODIUM: 140 mmol/L (ref 135–145)

## 2015-12-23 LAB — CBC
HEMATOCRIT: 41.4 % (ref 39.0–52.0)
HEMOGLOBIN: 14.9 g/dL (ref 13.0–17.0)
MCH: 32.3 pg (ref 26.0–34.0)
MCHC: 36 g/dL (ref 30.0–36.0)
MCV: 89.8 fL (ref 78.0–100.0)
Platelets: 107 10*3/uL — ABNORMAL LOW (ref 150–400)
RBC: 4.61 MIL/uL (ref 4.22–5.81)
RDW: 12.8 % (ref 11.5–15.5)
WBC: 3.2 10*3/uL — AB (ref 4.0–10.5)

## 2015-12-23 LAB — TROPONIN I: Troponin I: <0.03 ng/mL (ref <0.031)

## 2015-12-23 MED ORDER — ASPIRIN EC 81 MG PO TBEC
81.0000 mg | DELAYED_RELEASE_TABLET | Freq: Every day | ORAL | Status: DC
  Administered 2015-12-24: 81 mg via ORAL
  Filled 2015-12-23: qty 1

## 2015-12-23 MED ORDER — ACETAMINOPHEN 325 MG PO TABS
650.0000 mg | ORAL_TABLET | ORAL | Status: DC | PRN
  Administered 2015-12-24 (×3): 650 mg via ORAL
  Filled 2015-12-23 (×3): qty 2

## 2015-12-23 MED ORDER — MORPHINE SULFATE (PF) 2 MG/ML IV SOLN
2.0000 mg | INTRAVENOUS | Status: DC | PRN
  Administered 2015-12-24 (×2): 2 mg via INTRAVENOUS
  Filled 2015-12-23 (×2): qty 1

## 2015-12-23 MED ORDER — MORPHINE SULFATE (PF) 4 MG/ML IV SOLN: 4.0000 mg | INTRAVENOUS | Status: DC | PRN

## 2015-12-23 MED ORDER — PANTOPRAZOLE SODIUM 20 MG PO TBEC
20.0000 mg | DELAYED_RELEASE_TABLET | Freq: Two times a day (BID) | ORAL | Status: DC
  Filled 2015-12-23 (×3): qty 1

## 2015-12-23 MED ORDER — ONDANSETRON HCL 4 MG/2ML IJ SOLN
4.0000 mg | Freq: Four times a day (QID) | INTRAMUSCULAR | Status: DC | PRN
  Administered 2015-12-24: 4 mg via INTRAVENOUS
  Filled 2015-12-23: qty 2

## 2015-12-23 MED ORDER — ENOXAPARIN SODIUM 40 MG/0.4ML ~~LOC~~ SOLN
40.0000 mg | SUBCUTANEOUS | Status: DC
  Administered 2015-12-24: 40 mg via SUBCUTANEOUS
  Filled 2015-12-23: qty 0.4

## 2015-12-23 MED ORDER — RAMIPRIL 5 MG PO CAPS
5.0000 mg | ORAL_CAPSULE | Freq: Every day | ORAL | Status: DC
  Administered 2015-12-24: 5 mg via ORAL
  Filled 2015-12-23: qty 1

## 2015-12-23 MED ORDER — NITROGLYCERIN 2 % TD OINT
0.5000 [in_us] | TOPICAL_OINTMENT | Freq: Four times a day (QID) | TRANSDERMAL | Status: DC
  Administered 2015-12-24 (×3): 0.5 [in_us] via TOPICAL
  Filled 2015-12-23 (×3): qty 1

## 2015-12-23 MED ORDER — METOPROLOL TARTRATE 50 MG PO TABS
50.0000 mg | ORAL_TABLET | Freq: Every day | ORAL | Status: DC
  Administered 2015-12-24: 50 mg via ORAL
  Filled 2015-12-23: qty 1

## 2015-12-23 MED ORDER — CITALOPRAM HYDROBROMIDE 20 MG PO TABS
20.0000 mg | ORAL_TABLET | Freq: Every day | ORAL | Status: DC
  Administered 2015-12-24: 20 mg via ORAL
  Filled 2015-12-23: qty 1

## 2015-12-23 MED ORDER — MOMETASONE FURO-FORMOTEROL FUM 100-5 MCG/ACT IN AERO
2.0000 | INHALATION_SPRAY | Freq: Two times a day (BID) | RESPIRATORY_TRACT | Status: DC
  Administered 2015-12-24: 2 via RESPIRATORY_TRACT
  Filled 2015-12-23: qty 8.8

## 2015-12-23 MED ORDER — INSULIN ASPART 100 UNIT/ML ~~LOC~~ SOLN
0.0000 [IU] | Freq: Three times a day (TID) | SUBCUTANEOUS | Status: DC
  Administered 2015-12-24: 5 [IU] via SUBCUTANEOUS

## 2015-12-23 MED ORDER — INSULIN GLARGINE 100 UNIT/ML ~~LOC~~ SOLN
60.0000 [IU] | Freq: Two times a day (BID) | SUBCUTANEOUS | Status: DC
  Administered 2015-12-24 (×2): 60 [IU] via SUBCUTANEOUS
  Filled 2015-12-23 (×4): qty 0.6

## 2015-12-23 MED ORDER — GABAPENTIN 400 MG PO CAPS
400.0000 mg | ORAL_CAPSULE | Freq: Three times a day (TID) | ORAL | Status: DC
  Administered 2015-12-24 (×2): 400 mg via ORAL
  Filled 2015-12-23 (×2): qty 1

## 2015-12-23 MED ORDER — LAMOTRIGINE 25 MG PO TABS
25.0000 mg | ORAL_TABLET | Freq: Every day | ORAL | Status: DC
  Administered 2015-12-24: 25 mg via ORAL
  Filled 2015-12-23 (×2): qty 1

## 2015-12-23 MED ORDER — AMLODIPINE BESYLATE 5 MG PO TABS
2.5000 mg | ORAL_TABLET | Freq: Every day | ORAL | Status: DC
  Administered 2015-12-24: 2.5 mg via ORAL
  Filled 2015-12-23: qty 1

## 2015-12-23 MED ORDER — NITROGLYCERIN 0.4 MG SL SUBL
0.4000 mg | SUBLINGUAL_TABLET | SUBLINGUAL | Status: DC | PRN
  Filled 2015-12-23: qty 1

## 2015-12-23 MED ORDER — ASPIRIN 81 MG PO CHEW
324.0000 mg | CHEWABLE_TABLET | Freq: Once | ORAL | Status: AC
  Administered 2015-12-23: 324 mg via ORAL
  Filled 2015-12-23: qty 4

## 2015-12-23 MED ORDER — CLOPIDOGREL BISULFATE 75 MG PO TABS
75.0000 mg | ORAL_TABLET | Freq: Every day | ORAL | Status: DC
  Administered 2015-12-24: 75 mg via ORAL
  Filled 2015-12-23: qty 1

## 2015-12-23 NOTE — ED Notes (Signed)
Pt's wife called, no answer. VM left with room # only.

## 2015-12-23 NOTE — ED Notes (Addendum)
Pt went to work and began having left chest pain with sweating, nausea, and intermittent dizziness. Pt is alert and oriented at this time. NAD noted.   Pt has taken 2 nitro at home with little relief.

## 2015-12-23 NOTE — ED Provider Notes (Signed)
CSN: [id number]     Arrival date & time 12/23/15  1748 History   First MD Initiated Contact with Patient 12/23/15 1958     Chief Complaint  Patient presents with  . Chest Pain      HPI Pt was seen at 2020. Per pt, c/o gradual onset and persistence of waxing and waning chest "pain" that began approximately 0900 this morning. Describes the CP as "tightness" and "like the last time I needed a stent."  Has been associated with SOB, nausea and diaphoresis. Pt states he was "hanging up hangers" when his symptoms began. Pt has taken his own SL ntg with intermittent relief. Denies palpitations, no cough, no abd pain, no vomiting/diarrhea, no back pain, no fevers, no rash.    Past Medical History  Diagnosis Date  . Coronary atherosclerosis of native coronary artery     a. LAD & OM stenting;  b. 2007 Cath: nonobs dzs, patent stents;  c. 07/2012 neg MV, EF 61%.  d. 11/20/14: [place] s/p  PCI w/ DES to pLAD and DES to OM1   . Asthma   . Essential hypertension, benign   . TIA (transient ischemic attack) ~ 2010  . Anxiety   . Hypercholesteremia   . GERD (gastroesophageal reflux disease)   . OSA on CPAP   . Type 2 diabetes mellitus ([place])   . History of hiatal hernia   . Migraine   . Arthritis   . Lumbar herniated disc   . Chronic lower back pain   . History of gout   . Thrombocytopenia ([place])   . MI, old    Past Surgical History  Procedure Laterality Date  . Anterior cervical decomp/discectomy fusion  1998    "C3-4"  . Carpal tunnel release Bilateral 2005  . Shoulder arthroscopy Left ~ 2011  . Knee surgery Left 02/2012    "scraped; open"  . Neuroplasty / transposition ulnar nerve at elbow Right ~ 2012  . Left heart catheterization with coronary angiogram N/A 07/20/2012    Procedure: LEFT HEART CATHETERIZATION WITH CORONARY ANGIOGRAM;  Surgeon: [name], MD;  Location: [facility] CATH LAB;  Service: Cardiovascular;  Laterality: N/A;  . Coronary angioplasty with stent placement  2002; 2003;  11/20/2014    "I have 4 stents after today" (11/20/2014)  . Cardiac catheterization  "several"  . Coronary angioplasty    . Left heart catheterization with coronary angiogram N/A 11/20/2014    Procedure: LEFT HEART CATHETERIZATION WITH CORONARY ANGIOGRAM;  Surgeon: [name], MD;  Location: [facility] CATH LAB;  Service: Cardiovascular;  Laterality: N/A;  . Percutaneous coronary rotoblator intervention (pci-r)  11/20/2014    Procedure: PERCUTANEOUS CORONARY ROTOBLATOR INTERVENTION (PCI-R);  Surgeon: [name], MD;  Location: [facility] CATH LAB;  Service: Cardiovascular;;  . Left heart catheterization with coronary angiogram N/A 11/26/2014    Procedure: LEFT HEART CATHETERIZATION WITH CORONARY ANGIOGRAM;  Surgeon: [name], MD;  Location: [facility] CATH LAB;  Service: Cardiovascular;  Laterality: N/A;   Family History  Problem Relation Age of Onset  . Coronary artery disease Father 32   Social History  Substance Use Topics  . Smoking status: Never Smoker   . Smokeless tobacco: Never Used  . Alcohol Use: No    Review of Systems ROS: Statement: All systems negative except as marked or noted in the HPI; Constitutional: Negative for fever and chills. ; ; Eyes: Negative for eye pain, redness and discharge. ; ; ENMT: Negative for ear pain, hoarseness, nasal congestion, sinus  pressure and sore throat. ; ; Cardiovascular: +CP, SOB, diaphoresis. Negative for palpitations, and peripheral edema. ; ; Respiratory: Negative for cough, wheezing and stridor. ; ; Gastrointestinal: +nausea. Negative for vomiting, diarrhea, abdominal pain, blood in stool, hematemesis, jaundice and rectal bleeding. . ; ; Genitourinary: Negative for dysuria, flank pain and hematuria. ; ; Musculoskeletal: Negative for back pain and neck pain. Negative for swelling and trauma.; ; Skin: Negative for pruritus, rash, abrasions, blisters, bruising and skin lesion.; ; Neuro: Negative for headache, lightheadedness and neck stiffness. Negative for  weakness, altered level of consciousness , altered mental status, extremity weakness, paresthesias, involuntary movement, seizure and syncope.      Allergies  Imdur; Statins; and Tricor  Home Medications   Prior to Admission medications   Medication Sig Start Date End Date Taking? Authorizing Provider  albuterol (PROVENTIL HFA;VENTOLIN HFA) 108 (90 BASE) MCG/ACT inhaler Inhale 2 puffs into the lungs every 6 (six) hours as needed for wheezing or shortness of breath.   Yes Historical Provider, MD  amLODipine (NORVASC) 2.5 MG tablet TAKE 1 TABLET BY MOUTH DAILY 11/05/15  Yes [name], MD  aspirin EC 81 MG tablet Take 81 mg by mouth at bedtime.    Yes Historical Provider, MD  citalopram (CELEXA) 20 MG tablet Take 20 mg by mouth at bedtime.    Yes Historical Provider, MD  clopidogrel (PLAVIX) 75 MG tablet Take 75 mg by mouth at bedtime.   Yes Historical Provider, MD  fluticasone (FLONASE) 50 MCG/ACT nasal spray Place 1 spray into the nose 2 (two) times daily.    Yes Historical Provider, MD  Fluticasone-Salmeterol (ADVAIR) 250-50 MCG/DOSE AEPB Inhale 1 puff into the lungs every 12 (twelve) hours.   Yes Historical Provider, MD  gabapentin (NEURONTIN) 400 MG capsule Take 400 mg by mouth 3 (three) times daily.   Yes Historical Provider, MD  insulin aspart (NOVOLOG) 100 UNIT/ML injection Inject 2-30 Units into the skin 3 (three) times daily before meals. Per SSI   Yes Historical Provider, MD  Insulin Glargine (LANTUS SOLOSTAR) 100 UNIT/ML Solostar Pen Inject 60 Units into the skin 2 (two) times daily.   Yes Historical Provider, MD  lamoTRIgine (LAMICTAL) 25 MG tablet Take 25 mg by mouth daily.   Yes Historical Provider, MD  Melatonin 5 MG TABS Take 10 mg by mouth at bedtime.    Yes Historical Provider, MD  metFORMIN (GLUCOPHAGE) 1000 MG tablet TAKE 1 TABLET BY MOUTH TWICE DAILY 12/10/14  Yes [name], MD  metoprolol (LOPRESSOR) 50 MG tablet Take 1 tablet (50 mg total) by mouth daily.  06/04/15  Yes [name], MD  nitroGLYCERIN (NITROSTAT) 0.4 MG SL tablet Place 0.4 mg under the tongue every 5 (five) minutes as needed for chest pain.    Yes Historical Provider, MD  pantoprazole (PROTONIX) 20 MG tablet Take 20 mg by mouth 2 (two) times daily before a meal.   Yes Historical Provider, MD  ramipril (ALTACE) 5 MG tablet Take 5 mg by mouth at bedtime.    Yes Historical Provider, MD   BP 156/85 mmHg  Pulse 66  Temp(Src) 98.4 F (36.9 C) (Oral)  Resp 16  Ht 5' 9"  (1.753 m)  Wt 235 lb (106.595 kg)  BMI 34.69 kg/m2  SpO2 95% Physical Exam  2025: Physical examination:  Nursing notes reviewed; Vital signs and O2 SAT reviewed;  Constitutional: Well developed, Well nourished, Well hydrated, In no acute distress; Head:  Normocephalic, atraumatic; Eyes: EOMI, PERRL, No scleral icterus;  ENMT: Mouth and pharynx normal, Mucous membranes moist; Neck: Supple, Full range of motion, No lymphadenopathy; Cardiovascular: Regular rate and rhythm, No gallop; Respiratory: Breath sounds clear & equal bilaterally, No wheezes.  Speaking full sentences with ease, Normal respiratory effort/excursion; Chest: Nontender, Movement normal; Abdomen: Soft, Nontender, Nondistended, Normal bowel sounds; Genitourinary: No CVA tenderness; Extremities: Pulses normal, No tenderness, No edema, No calf edema or asymmetry.; Neuro: AA&Ox3, Major CN grossly intact.  Speech clear. No gross focal motor or sensory deficits in extremities.; Skin: Color normal, Warm, Dry.   ED Course  Procedures (including critical care time) Labs Review  Imaging Review  I have personally reviewed and evaluated these images and lab results as part of my medical decision-making.   EKG Interpretation   Date/Time:  Monday December 23 2015 17:57:35 EST Ventricular Rate:  79 PR Interval:  140 QRS Duration: 78 QT Interval:  372 QTC Calculation: 426 R Axis:   51 Text Interpretation:  Normal sinus rhythm Normal ECG When compared with   ECG of 10/29/2015 No significant change was found Confirmed by [facility]   MD, Nunzio Cory [phone]) on 12/23/2015 8:36:49 PM      MDM  MDM Reviewed: previous chart, nursing note and vitals Reviewed previous: labs and ECG Interpretation: labs, ECG and x-ray      Results for orders placed or performed during the hospital encounter of 63/87/56  Basic metabolic panel  Result Value Ref Range   Sodium 140 135 - 145 mmol/L   Potassium 4.1 3.5 - 5.1 mmol/L   Chloride 105 101 - 111 mmol/L   CO2 27 22 - 32 mmol/L   Glucose, Bld 251 (H) 65 - 99 mg/dL   BUN 19 6 - 20 mg/dL   Creatinine, Ser 0.85 0.61 - 1.24 mg/dL   Calcium 9.0 8.9 - 10.3 mg/dL   GFR calc non Af Amer >60 >60 mL/min   GFR calc Af Amer >60 >60 mL/min   Anion gap 8 5 - 15  CBC  Result Value Ref Range   WBC 3.2 (L) 4.0 - 10.5 K/uL   RBC 4.61 4.22 - 5.81 MIL/uL   Hemoglobin 14.9 13.0 - 17.0 g/dL   HCT 41.4 39.0 - 52.0 %   MCV 89.8 78.0 - 100.0 fL   MCH 32.3 26.0 - 34.0 pg   MCHC 36.0 30.0 - 36.0 g/dL   RDW 12.8 11.5 - 15.5 %   Platelets 107 (L) 150 - 400 K/uL  Troponin I  Result Value Ref Range   Troponin I <0.03 <0.031 ng/mL   Dg Chest 2 View 12/23/2015  CLINICAL DATA:  60 year old male with left-sided chest pain since earlier this morning EXAM: CHEST  2 VIEW COMPARISON:  Prior chest x-ray 12/03/2014 FINDINGS: The lungs are clear and negative for focal airspace consolidation, pulmonary edema or suspicious pulmonary nodule. No pleural effusion or pneumothorax. Cardiac and mediastinal contours are within normal limits. Metallic stent projects the left heart. No acute fracture or lytic or blastic osseous lesions. The visualized upper abdominal bowel gas pattern is unremarkable. IMPRESSION: No active cardiopulmonary disease. Electronically Signed   By: Jacqulynn Cadet M.D.   On: 12/23/2015 18:46    2130:  ASA, SL ntg and IV morphine ordered. Pt with significant cardiac hx; will observation admit. Dx and testing d/w pt and  family.  Questions answered.  Verb understanding, agreeable to admit.    T/C to [facility] Dr. Darrick Meigs, case discussed, including:  HPI, pertinent PM/SHx, VS/PE, dx testing, ED course and treatment:  Agreeable to admit, requests to write temporary orders, obtain observation tele bed to team APAdmits.   Francine Graven, DO 12/27/15 2245

## 2015-12-23 NOTE — H&P (Signed)
PCP:   Deloria Lair, MD   Chief Complaint:  Chest pain  HPI: 60 year old male who   has a past medical history of Coronary atherosclerosis of native coronary artery; Asthma; Essential hypertension, benign; TIA (transient ischemic attack) (~ 2010); Anxiety; Hypercholesteremia; GERD (gastroesophageal reflux disease); OSA on CPAP; Type 2 diabetes mellitus ([place]); History of hiatal hernia; Migraine; Arthritis; Lumbar herniated disc; Chronic lower back pain; History of gout; Thrombocytopenia ([place]); and MI, old. Today presents with a hospital with left-sided chest pain associated with nausea and sweating started this morning around 8 AM. Patient says that the pain was intermittent, improved somewhat with nitroglycerin at home. So he came to the hospital for further evaluation. Patient has significant history of CAD, status post stents. Currently on aspirin and Plavix. He denies abdominal pain, vomiting or diarrhea. No shortness of breath. In the ED lab work revealed normal cardiac enzymes. EKG shows no significant mobility.  Allergies:   Allergies  Allergen Reactions  . Imdur [Isosorbide Dinitrate]     Headache, severe  . Statins     Muscle aches and cramps  . Tricor [Fenofibrate] Other (See Comments)    Leg cramps      Past Medical History  Diagnosis Date  . Coronary atherosclerosis of native coronary artery     a. LAD & OM stenting;  b. 2007 Cath: nonobs dzs, patent stents;  c. 07/2012 neg MV, EF 61%.  d. 11/20/14: [place] s/p  PCI w/ DES to pLAD and DES to OM1   . Asthma   . Essential hypertension, benign   . TIA (transient ischemic attack) ~ 2010  . Anxiety   . Hypercholesteremia   . GERD (gastroesophageal reflux disease)   . OSA on CPAP   . Type 2 diabetes mellitus ([place])   . History of hiatal hernia   . Migraine   . Arthritis   . Lumbar herniated disc   . Chronic lower back pain   . History of gout   . Thrombocytopenia ([place])   . MI, old     Past Surgical History    Procedure Laterality Date  . Anterior cervical decomp/discectomy fusion  1998    "C3-4"  . Carpal tunnel release Bilateral 2005  . Shoulder arthroscopy Left ~ 2011  . Knee surgery Left 02/2012    "scraped; open"  . Neuroplasty / transposition ulnar nerve at elbow Right ~ 2012  . Left heart catheterization with coronary angiogram N/A 07/20/2012    Procedure: LEFT HEART CATHETERIZATION WITH CORONARY ANGIOGRAM;  Surgeon: [name], MD;  Location: [facility] CATH LAB;  Service: Cardiovascular;  Laterality: N/A;  . Coronary angioplasty with stent placement  2002; 2003; 11/20/2014    "I have 4 stents after today" (11/20/2014)  . Cardiac catheterization  "several"  . Coronary angioplasty    . Left heart catheterization with coronary angiogram N/A 11/20/2014    Procedure: LEFT HEART CATHETERIZATION WITH CORONARY ANGIOGRAM;  Surgeon: [name], MD;  Location: [facility] CATH LAB;  Service: Cardiovascular;  Laterality: N/A;  . Percutaneous coronary rotoblator intervention (pci-r)  11/20/2014    Procedure: PERCUTANEOUS CORONARY ROTOBLATOR INTERVENTION (PCI-R);  Surgeon: [name], MD;  Location: [facility] CATH LAB;  Service: Cardiovascular;;  . Left heart catheterization with coronary angiogram N/A 11/26/2014    Procedure: LEFT HEART CATHETERIZATION WITH CORONARY ANGIOGRAM;  Surgeon: [name], MD;  Location: [facility] CATH LAB;  Service: Cardiovascular;  Laterality: N/A;    Prior to Admission medications   Medication Sig Start  Date End Date Taking? Authorizing Provider  albuterol (PROVENTIL HFA;VENTOLIN HFA) 108 (90 BASE) MCG/ACT inhaler Inhale 2 puffs into the lungs every 6 (six) hours as needed for wheezing or shortness of breath.   Yes Historical Provider, MD  amLODipine (NORVASC) 2.5 MG tablet TAKE 1 TABLET BY MOUTH DAILY 11/05/15  Yes [name], MD  aspirin EC 81 MG tablet Take 81 mg by mouth at bedtime.    Yes Historical Provider, MD  citalopram (CELEXA) 20 MG tablet Take 20 mg by mouth at bedtime.     Yes Historical Provider, MD  clopidogrel (PLAVIX) 75 MG tablet Take 75 mg by mouth at bedtime.   Yes Historical Provider, MD  fluticasone (FLONASE) 50 MCG/ACT nasal spray Place 1 spray into the nose 2 (two) times daily.    Yes Historical Provider, MD  Fluticasone-Salmeterol (ADVAIR) 250-50 MCG/DOSE AEPB Inhale 1 puff into the lungs every 12 (twelve) hours.   Yes Historical Provider, MD  gabapentin (NEURONTIN) 400 MG capsule Take 400 mg by mouth 3 (three) times daily.   Yes Historical Provider, MD  insulin aspart (NOVOLOG) 100 UNIT/ML injection Inject 2-30 Units into the skin 3 (three) times daily before meals. Per SSI   Yes Historical Provider, MD  Insulin Glargine (LANTUS SOLOSTAR) 100 UNIT/ML Solostar Pen Inject 60 Units into the skin 2 (two) times daily.   Yes Historical Provider, MD  lamoTRIgine (LAMICTAL) 25 MG tablet Take 25 mg by mouth daily.   Yes Historical Provider, MD  Melatonin 5 MG TABS Take 10 mg by mouth at bedtime.    Yes Historical Provider, MD  metFORMIN (GLUCOPHAGE) 1000 MG tablet TAKE 1 TABLET BY MOUTH TWICE DAILY 12/10/14  Yes [name], MD  metoprolol (LOPRESSOR) 50 MG tablet Take 1 tablet (50 mg total) by mouth daily. 06/04/15  Yes [name], MD  nitroGLYCERIN (NITROSTAT) 0.4 MG SL tablet Place 0.4 mg under the tongue every 5 (five) minutes as needed for chest pain.    Yes Historical Provider, MD  pantoprazole (PROTONIX) 20 MG tablet Take 20 mg by mouth 2 (two) times daily before a meal.   Yes Historical Provider, MD  ramipril (ALTACE) 5 MG tablet Take 5 mg by mouth at bedtime.    Yes Historical Provider, MD    Social History:  reports that he has never smoked. He has never used smokeless tobacco. He reports that he does not drink alcohol or use illicit drugs.  Family History  Problem Relation Age of Onset  . Coronary artery disease Father 46    Filed Weights   12/23/15 1759  Weight: 106.595 kg (235 lb)    All the positives are listed in  BOLD  Review of Systems:  HEENT: Headache, blurred vision, runny nose, sore throat Neck: Hypothyroidism, hyperthyroidism,,lymphadenopathy Chest : Shortness of breath, history of COPD, Asthma Heart : Chest pain, history of coronary arterey disease GI:  Nausea, vomiting, diarrhea, constipation, GERD GU: Dysuria, urgency, frequency of urination, hematuria Neuro: Stroke, seizures, syncope Psych: Depression, anxiety, hallucinations   Physical Exam: Blood pressure 156/85, pulse 66, temperature 98.4 F (36.9 C), temperature source Oral, resp. rate 16, height 5' 9"  (1.753 m), weight 106.595 kg (235 lb), SpO2 95 %. Constitutional:   Patient is a well-developed and well-nourished male* in no acute distress and cooperative with exam. Head: Normocephalic and atraumatic Mouth: Mucus membranes moist Eyes: PERRL, EOMI, conjunctivae normal Neck: Supple, No Thyromegaly Cardiovascular: RRR, S1 normal, S2 normal Pulmonary/Chest: CTAB, no wheezes, rales, or rhonchi Abdominal:  Soft. Non-tender, non-distended, bowel sounds are normal, no masses, organomegaly, or guarding present.  Neurological: A&O x3, Strength is normal and symmetric bilaterally, cranial nerve II-XII are grossly intact, no focal motor deficit, sensory intact to light touch bilaterally.  Extremities : No Cyanosis, Clubbing or Edema  Labs on Admission:  Basic Metabolic Panel:  Recent Labs Lab 12/23/15 1935  NA 140  K 4.1  CL 105  CO2 27  GLUCOSE 251*  BUN 19  CREATININE 0.85  CALCIUM 9.0   CBC:  Recent Labs Lab 12/23/15 1935  WBC 3.2*  HGB 14.9  HCT 41.4  MCV 89.8  PLT 107*   Cardiac Enzymes:  Recent Labs Lab 12/23/15 1935  TROPONINI <0.03     Radiological Exams on Admission: Dg Chest 2 View  12/23/2015  CLINICAL DATA:  60 year old male with left-sided chest pain since earlier this morning EXAM: CHEST  2 VIEW COMPARISON:  Prior chest x-ray 12/03/2014 FINDINGS: The lungs are clear and negative for focal  airspace consolidation, pulmonary edema or suspicious pulmonary nodule. No pleural effusion or pneumothorax. Cardiac and mediastinal contours are within normal limits. Metallic stent projects the left heart. No acute fracture or lytic or blastic osseous lesions. The visualized upper abdominal bowel gas pattern is unremarkable. IMPRESSION: No active cardiopulmonary disease. Electronically Signed   By: Jacqulynn Cadet M.D.   On: 12/23/2015 18:46    EKG: Independently reviewed normal sinus rhythm   Assessment/Plan Active Problems:   Type 2 diabetes mellitus (HCC)   Coronary atherosclerosis of native coronary artery   Precordial chest pain   Chest pain   Chest pain Admit patient to rule out ACS Monitor patient on telemetry, cycle cardiac enzymes. Start Nitropaste 0.5 inch every 6 hours Morphine when necessary for pain  Diabetes mellitus *Sliding-scale insulin with NovoLog Continue Lantus 60 units twice a day Hold metformin  History of CAD Continue aspirin, Plavix  Hypertension Continue amlodipine, metoprolol  DVT prophylaxis Lovenox  Code status: Full code  Family discussion: No family at bedside   Time Spent on Admission: 60 min  [facility] Pager: [phone] 12/23/2015, 9:26 PM  If 7PM-7AM, please contact night-coverage  [email or web address]  Password [email or web address]

## 2015-12-23 NOTE — ED Notes (Signed)
Pt's wife called. No answer. VM left with room

## 2015-12-24 ENCOUNTER — Encounter (HOSPITAL_COMMUNITY): Payer: Self-pay | Admitting: *Deleted

## 2015-12-24 DIAGNOSIS — R079 Chest pain, unspecified: Secondary | ICD-10-CM | POA: Diagnosis not present

## 2015-12-24 DIAGNOSIS — I2510 Atherosclerotic heart disease of native coronary artery without angina pectoris: Secondary | ICD-10-CM | POA: Insufficient documentation

## 2015-12-24 DIAGNOSIS — I25110 Atherosclerotic heart disease of native coronary artery with unstable angina pectoris: Secondary | ICD-10-CM | POA: Diagnosis not present

## 2015-12-24 DIAGNOSIS — Z955 Presence of coronary angioplasty implant and graft: Secondary | ICD-10-CM | POA: Insufficient documentation

## 2015-12-24 DIAGNOSIS — I252 Old myocardial infarction: Secondary | ICD-10-CM | POA: Diagnosis not present

## 2015-12-24 LAB — GLUCOSE, CAPILLARY
GLUCOSE-CAPILLARY: 227 mg/dL — AB (ref 65–99)
Glucose-Capillary: 120 mg/dL — ABNORMAL HIGH (ref 65–99)
Glucose-Capillary: 296 mg/dL — ABNORMAL HIGH (ref 65–99)

## 2015-12-24 LAB — TROPONIN I: Troponin I: <0.03 ng/mL (ref <0.031)

## 2015-12-24 MED ORDER — PANTOPRAZOLE SODIUM 40 MG PO TBEC
40.0000 mg | DELAYED_RELEASE_TABLET | Freq: Two times a day (BID) | ORAL | Status: DC
  Administered 2015-12-24: 40 mg via ORAL
  Filled 2015-12-24: qty 1

## 2015-12-24 NOTE — Care Management Obs Status (Signed)
[place] NOTIFICATION   Patient Details  Name: [name] [name] MRN: [id number] Date of Birth: 08-Jan-1956   Medicare Observation Status Notification Given:  Yes    Alvie Heidelberg, RN 12/24/2015, 8:24 AM

## 2015-12-24 NOTE — Discharge Summary (Signed)
Physician Discharge Summary  [name] [name] GQQ:[id number] DOB: 05-16-1956 DOA: 12/23/2015  PCP: Deloria Lair, MD  Admit date: 12/23/2015 Discharge date: 12/24/2015  Time spent: 45 minutes  Recommendations for Outpatient Follow-up:  -We be discharged home today. -Advised to follow-up with cardiology in 2 weeks.   Discharge Diagnoses:  Active Problems:   Type 2 diabetes mellitus (HCC)   Coronary atherosclerosis of native coronary artery   Precordial chest pain   Chest pain   Discharge Condition: Stable and improved  Filed Weights   12/23/15 1759 12/23/15 2223  Weight: 106.595 kg (235 lb) 103.783 kg (228 lb 12.8 oz)    History of present illness:  As per Dr. Darrick Meigs on 2/13: 60 year old male who  has a past medical history of Coronary atherosclerosis of native coronary artery; Asthma; Essential hypertension, benign; TIA (transient ischemic attack) (~ 2010); Anxiety; Hypercholesteremia; GERD (gastroesophageal reflux disease); OSA on CPAP; Type 2 diabetes mellitus ([place]); History of hiatal hernia; Migraine; Arthritis; Lumbar herniated disc; Chronic lower back pain; History of gout; Thrombocytopenia ([place]); and MI, old. Today presents with a hospital with left-sided chest pain associated with nausea and sweating started this morning around 8 AM. Patient says that the pain was intermittent, improved somewhat with nitroglycerin at home. So he came to the hospital for further evaluation. Patient has significant history of CAD, status post stents. Currently on aspirin and Plavix. He denies abdominal pain, vomiting or diarrhea. No shortness of breath. In the ED lab work revealed normal cardiac enzymes. EKG shows no significant mobility.  Hospital Course:   Chest pain -Resolved. -Ruled out for acute coronary syndrome with negative troponins and EKG without acute ischemic abnormalities. -Given history of coronary artery disease, advised to follow-up with cardiologist in 2 weeks for  consideration of stress test.  Past of chronic medical conditions have been stable  Procedures:  None   Consultations:  None  Discharge Instructions  Discharge Instructions    Diet - low sodium heart healthy    Complete by:  As directed      Increase activity slowly    Complete by:  As directed             Medication List    TAKE these medications        albuterol 108 (90 Base) MCG/ACT inhaler  Commonly known as:  PROVENTIL HFA;VENTOLIN HFA  Inhale 2 puffs into the lungs every 6 (six) hours as needed for wheezing or shortness of breath.     amLODipine 2.5 MG tablet  Commonly known as:  NORVASC  TAKE 1 TABLET BY MOUTH DAILY     aspirin EC 81 MG tablet  Take 81 mg by mouth at bedtime.     citalopram 20 MG tablet  Commonly known as:  CELEXA  Take 20 mg by mouth at bedtime.     clopidogrel 75 MG tablet  Commonly known as:  PLAVIX  Take 75 mg by mouth at bedtime.     fluticasone 50 MCG/ACT nasal spray  Commonly known as:  FLONASE  Place 1 spray into the nose 2 (two) times daily.     Fluticasone-Salmeterol 250-50 MCG/DOSE Aepb  Commonly known as:  ADVAIR  Inhale 1 puff into the lungs every 12 (twelve) hours.     gabapentin 400 MG capsule  Commonly known as:  NEURONTIN  Take 400 mg by mouth 3 (three) times daily.     insulin aspart 100 UNIT/ML injection  Commonly known as:  novoLOG  Inject 2-30  Units into the skin 3 (three) times daily before meals. Per SSI     lamoTRIgine 25 MG tablet  Commonly known as:  LAMICTAL  Take 25 mg by mouth daily.     LANTUS SOLOSTAR 100 UNIT/ML Solostar Pen  Generic drug:  Insulin Glargine  Inject 60 Units into the skin 2 (two) times daily.     Melatonin 5 MG Tabs  Take 10 mg by mouth at bedtime.     metFORMIN 1000 MG tablet  Commonly known as:  GLUCOPHAGE  TAKE 1 TABLET BY MOUTH TWICE DAILY     metoprolol 50 MG tablet  Commonly known as:  LOPRESSOR  Take 1 tablet (50 mg total) by mouth daily.     nitroGLYCERIN  0.4 MG SL tablet  Commonly known as:  NITROSTAT  Place 0.4 mg under the tongue every 5 (five) minutes as needed for chest pain.     pantoprazole 20 MG tablet  Commonly known as:  PROTONIX  Take 20 mg by mouth 2 (two) times daily before a meal.     ramipril 5 MG tablet  Commonly known as:  ALTACE  Take 5 mg by mouth at bedtime.     terbinafine 250 MG tablet  Commonly known as:  LAMISIL  Take 250 mg by mouth daily.       Allergies  Allergen Reactions  . Imdur [Isosorbide Dinitrate]     Headache, severe  . Statins     Muscle aches and cramps  . Tricor [Fenofibrate] Other (See Comments)    Leg cramps       Follow-up Information    Follow up with Rozann Lesches, MD. Schedule an appointment as soon as possible for a visit in 2 weeks.   Specialty:  Cardiology   Contact information:   [place] [address] [phone]        The results of significant diagnostics from this hospitalization (including imaging, microbiology, ancillary and laboratory) are listed below for reference.    Significant Diagnostic Studies: Dg Chest 2 View  12/23/2015  CLINICAL DATA:  60 year old male with left-sided chest pain since earlier this morning EXAM: CHEST  2 VIEW COMPARISON:  Prior chest x-ray 12/03/2014 FINDINGS: The lungs are clear and negative for focal airspace consolidation, pulmonary edema or suspicious pulmonary nodule. No pleural effusion or pneumothorax. Cardiac and mediastinal contours are within normal limits. Metallic stent projects the left heart. No acute fracture or lytic or blastic osseous lesions. The visualized upper abdominal bowel gas pattern is unremarkable. IMPRESSION: No active cardiopulmonary disease. Electronically Signed   By: Jacqulynn Cadet M.D.   On: 12/23/2015 18:46    Microbiology: No results found for this or any previous visit (from the past 240 hour(s)).   Labs: Basic Metabolic Panel:  Recent Labs Lab 12/23/15 1935  NA 140  K 4.1    CL 105  CO2 27  GLUCOSE 251*  BUN 19  CREATININE 0.85  CALCIUM 9.0   Liver Function Tests: No results for input(s): AST, ALT, ALKPHOS, BILITOT, PROT, ALBUMIN in the last 168 hours. No results for input(s): LIPASE, AMYLASE in the last 168 hours. No results for input(s): AMMONIA in the last 168 hours. CBC:  Recent Labs Lab 12/23/15 1935  WBC 3.2*  HGB 14.9  HCT 41.4  MCV 89.8  PLT 107*   Cardiac Enzymes:  Recent Labs Lab 12/23/15 1935 12/23/15 2200 12/24/15 0402 12/24/15 1012  TROPONINI <0.03 <0.03 <0.03 <0.03   BNP: BNP (last 3  results) No results for input(s): BNP in the last 8760 hours.  ProBNP (last 3 results) No results for input(s): PROBNP in the last 8760 hours.  CBG:  Recent Labs Lab 12/24/15 0008 12/24/15 0743 12/24/15 1101  GLUCAP 227* 120* 296*       Signed:  HERNANDEZ ACOSTA,ESTELA  [facility] Pager: [phone] 12/24/2015, 3:32 PM

## 2015-12-25 LAB — HEMOGLOBIN A1C
HEMOGLOBIN A1C: 7.9 % — AB (ref 4.8–5.6)
Mean Plasma Glucose: 180 mg/dL

## 2015-12-28 ENCOUNTER — Encounter (HOSPITAL_COMMUNITY): Payer: Self-pay | Admitting: Nurse Practitioner

## 2015-12-28 ENCOUNTER — Observation Stay (HOSPITAL_COMMUNITY)
Admission: EM | Admit: 2015-12-28 | Discharge: 2016-01-02 | Disposition: A | Discharge status: Home / Self Care | Payer: Medicare Other | Attending: Internal Medicine | Admitting: Internal Medicine

## 2015-12-28 ENCOUNTER — Emergency Department (HOSPITAL_COMMUNITY): Payer: Medicare Other

## 2015-12-28 DIAGNOSIS — Z7902 Long term (current) use of antithrombotics/antiplatelets: Secondary | ICD-10-CM | POA: Insufficient documentation

## 2015-12-28 DIAGNOSIS — J45909 Unspecified asthma, uncomplicated: Secondary | ICD-10-CM | POA: Insufficient documentation

## 2015-12-28 DIAGNOSIS — E1140 Type 2 diabetes mellitus with diabetic neuropathy, unspecified: Secondary | ICD-10-CM | POA: Diagnosis not present

## 2015-12-28 DIAGNOSIS — E785 Hyperlipidemia, unspecified: Secondary | ICD-10-CM | POA: Diagnosis not present

## 2015-12-28 DIAGNOSIS — Z9861 Coronary angioplasty status: Secondary | ICD-10-CM

## 2015-12-28 DIAGNOSIS — I10 Essential (primary) hypertension: Secondary | ICD-10-CM | POA: Diagnosis not present

## 2015-12-28 DIAGNOSIS — R079 Chest pain, unspecified: Secondary | ICD-10-CM | POA: Diagnosis not present

## 2015-12-28 DIAGNOSIS — I2583 Coronary atherosclerosis due to lipid rich plaque: Secondary | ICD-10-CM | POA: Insufficient documentation

## 2015-12-28 DIAGNOSIS — E1165 Type 2 diabetes mellitus with hyperglycemia: Secondary | ICD-10-CM | POA: Diagnosis not present

## 2015-12-28 DIAGNOSIS — Z794 Long term (current) use of insulin: Secondary | ICD-10-CM | POA: Insufficient documentation

## 2015-12-28 DIAGNOSIS — E119 Type 2 diabetes mellitus without complications: Secondary | ICD-10-CM

## 2015-12-28 DIAGNOSIS — I2510 Atherosclerotic heart disease of native coronary artery without angina pectoris: Secondary | ICD-10-CM | POA: Diagnosis not present

## 2015-12-28 DIAGNOSIS — E1169 Type 2 diabetes mellitus with other specified complication: Secondary | ICD-10-CM

## 2015-12-28 DIAGNOSIS — G459 Transient cerebral ischemic attack, unspecified: Secondary | ICD-10-CM | POA: Diagnosis present

## 2015-12-28 DIAGNOSIS — I252 Old myocardial infarction: Secondary | ICD-10-CM | POA: Insufficient documentation

## 2015-12-28 DIAGNOSIS — G4733 Obstructive sleep apnea (adult) (pediatric): Secondary | ICD-10-CM | POA: Diagnosis not present

## 2015-12-28 DIAGNOSIS — J449 Chronic obstructive pulmonary disease, unspecified: Secondary | ICD-10-CM | POA: Diagnosis not present

## 2015-12-28 DIAGNOSIS — Z955 Presence of coronary angioplasty implant and graft: Secondary | ICD-10-CM | POA: Diagnosis not present

## 2015-12-28 DIAGNOSIS — I25110 Atherosclerotic heart disease of native coronary artery with unstable angina pectoris: Principal | ICD-10-CM | POA: Insufficient documentation

## 2015-12-28 DIAGNOSIS — R0602 Shortness of breath: Secondary | ICD-10-CM | POA: Insufficient documentation

## 2015-12-28 DIAGNOSIS — Z7982 Long term (current) use of aspirin: Secondary | ICD-10-CM | POA: Diagnosis not present

## 2015-12-28 DIAGNOSIS — Z8673 Personal history of transient ischemic attack (TIA), and cerebral infarction without residual deficits: Secondary | ICD-10-CM | POA: Diagnosis not present

## 2015-12-28 DIAGNOSIS — Z79899 Other long term (current) drug therapy: Secondary | ICD-10-CM | POA: Insufficient documentation

## 2015-12-28 LAB — COMPREHENSIVE METABOLIC PANEL
ALK PHOS: 40 U/L (ref 38–126)
ALT: 43 U/L (ref 17–63)
AST: 33 U/L (ref 15–41)
Albumin: 3.5 g/dL (ref 3.5–5.0)
Anion gap: 11 (ref 5–15)
BILIRUBIN TOTAL: 0.7 mg/dL (ref 0.3–1.2)
BUN: 21 mg/dL — ABNORMAL HIGH (ref 6–20)
CALCIUM: 9.4 mg/dL (ref 8.9–10.3)
CO2: 22 mmol/L (ref 22–32)
CREATININE: 1.11 mg/dL (ref 0.61–1.24)
Chloride: 106 mmol/L (ref 101–111)
GFR calc non Af Amer: >60 mL/min (ref >60)
GLUCOSE: 235 mg/dL — AB (ref 65–99)
Potassium: 4.5 mmol/L (ref 3.5–5.1)
SODIUM: 139 mmol/L (ref 135–145)
TOTAL PROTEIN: 6.5 g/dL (ref 6.5–8.1)

## 2015-12-28 LAB — I-STAT TROPONIN, ED: Troponin i, poc: 0 ng/mL (ref 0.00–0.08)

## 2015-12-28 LAB — CBC
HEMATOCRIT: 41.1 % (ref 39.0–52.0)
Hemoglobin: 14.3 g/dL (ref 13.0–17.0)
MCH: 31.4 pg (ref 26.0–34.0)
MCHC: 34.8 g/dL (ref 30.0–36.0)
MCV: 90.1 fL (ref 78.0–100.0)
Platelets: 106 10*3/uL — ABNORMAL LOW (ref 150–400)
RBC: 4.56 MIL/uL (ref 4.22–5.81)
RDW: 12.9 % (ref 11.5–15.5)
WBC: 3.5 10*3/uL — AB (ref 4.0–10.5)

## 2015-12-28 LAB — GLUCOSE, CAPILLARY: Glucose-Capillary: 158 mg/dL — ABNORMAL HIGH (ref 65–99)

## 2015-12-28 LAB — D-DIMER, QUANTITATIVE (NOT AT ARMC)

## 2015-12-28 MED ORDER — GABAPENTIN 400 MG PO CAPS
400.0000 mg | ORAL_CAPSULE | Freq: Three times a day (TID) | ORAL | Status: DC
  Administered 2015-12-29 – 2016-01-02 (×14): 400 mg via ORAL
  Filled 2015-12-28 (×14): qty 1

## 2015-12-28 MED ORDER — INSULIN ASPART 100 UNIT/ML ~~LOC~~ SOLN
0.0000 [IU] | Freq: Every day | SUBCUTANEOUS | Status: DC
  Administered 2015-12-29: 3 [IU] via SUBCUTANEOUS
  Administered 2015-12-30 – 2016-01-01 (×2): 2 [IU] via SUBCUTANEOUS

## 2015-12-28 MED ORDER — GI COCKTAIL ~~LOC~~
30.0000 mL | Freq: Four times a day (QID) | ORAL | Status: DC | PRN
Start: 2015-12-28 — End: 2016-01-02

## 2015-12-28 MED ORDER — AMLODIPINE BESYLATE 2.5 MG PO TABS
2.5000 mg | ORAL_TABLET | Freq: Every day | ORAL | Status: DC
  Administered 2015-12-29 – 2015-12-30 (×2): 2.5 mg via ORAL
  Filled 2015-12-28 (×2): qty 1

## 2015-12-28 MED ORDER — ENOXAPARIN SODIUM 40 MG/0.4ML ~~LOC~~ SOLN
40.0000 mg | SUBCUTANEOUS | Status: DC
  Administered 2015-12-29 – 2016-01-02 (×4): 40 mg via SUBCUTANEOUS
  Filled 2015-12-28 (×4): qty 0.4

## 2015-12-28 MED ORDER — TERBINAFINE HCL 250 MG PO TABS
250.0000 mg | ORAL_TABLET | Freq: Every day | ORAL | Status: DC
  Administered 2015-12-29 – 2016-01-01 (×5): 250 mg via ORAL
  Filled 2015-12-28 (×5): qty 1

## 2015-12-28 MED ORDER — RAMIPRIL 5 MG PO CAPS
5.0000 mg | ORAL_CAPSULE | Freq: Every day | ORAL | Status: DC
  Administered 2015-12-29 – 2016-01-01 (×5): 5 mg via ORAL
  Filled 2015-12-28 (×5): qty 1

## 2015-12-28 MED ORDER — CLOPIDOGREL BISULFATE 75 MG PO TABS
75.0000 mg | ORAL_TABLET | Freq: Every day | ORAL | Status: DC
  Administered 2015-12-29 – 2015-12-31 (×3): 75 mg via ORAL
  Filled 2015-12-28 (×3): qty 1

## 2015-12-28 MED ORDER — SODIUM CHLORIDE 0.9 % IV BOLUS (SEPSIS)
1000.0000 mL | Freq: Once | INTRAVENOUS | Status: AC
  Administered 2015-12-28: 1000 mL via INTRAVENOUS

## 2015-12-28 MED ORDER — MORPHINE SULFATE (PF) 4 MG/ML IV SOLN
4.0000 mg | Freq: Once | INTRAVENOUS | Status: AC
  Administered 2015-12-28: 4 mg via INTRAVENOUS
  Filled 2015-12-28: qty 1

## 2015-12-28 MED ORDER — CITALOPRAM HYDROBROMIDE 20 MG PO TABS
20.0000 mg | ORAL_TABLET | Freq: Every day | ORAL | Status: DC
  Administered 2015-12-29 – 2016-01-01 (×5): 20 mg via ORAL
  Filled 2015-12-28 (×5): qty 1

## 2015-12-28 MED ORDER — PANTOPRAZOLE SODIUM 40 MG PO TBEC
40.0000 mg | DELAYED_RELEASE_TABLET | Freq: Every day | ORAL | Status: DC
  Administered 2015-12-29 – 2016-01-02 (×5): 40 mg via ORAL
  Filled 2015-12-28 (×5): qty 1

## 2015-12-28 MED ORDER — MOMETASONE FURO-FORMOTEROL FUM 100-5 MCG/ACT IN AERO
2.0000 | INHALATION_SPRAY | Freq: Two times a day (BID) | RESPIRATORY_TRACT | Status: DC
  Administered 2015-12-29 – 2016-01-02 (×9): 2 via RESPIRATORY_TRACT
  Filled 2015-12-28: qty 8.8

## 2015-12-28 MED ORDER — KETOROLAC TROMETHAMINE 15 MG/ML IJ SOLN
15.0000 mg | Freq: Once | INTRAMUSCULAR | Status: AC
  Administered 2015-12-28: 15 mg via INTRAVENOUS
  Filled 2015-12-28: qty 1

## 2015-12-28 MED ORDER — INSULIN ASPART 100 UNIT/ML ~~LOC~~ SOLN
0.0000 [IU] | Freq: Three times a day (TID) | SUBCUTANEOUS | Status: DC
  Administered 2015-12-29 (×2): 5 [IU] via SUBCUTANEOUS
  Administered 2015-12-29: 2 [IU] via SUBCUTANEOUS
  Administered 2015-12-30: 5 [IU] via SUBCUTANEOUS
  Administered 2015-12-30: 2 [IU] via SUBCUTANEOUS
  Administered 2015-12-31: 3 [IU] via SUBCUTANEOUS
  Administered 2015-12-31: 5 [IU] via SUBCUTANEOUS
  Administered 2016-01-01: 11 [IU] via SUBCUTANEOUS
  Administered 2016-01-02: 2 [IU] via SUBCUTANEOUS
  Administered 2016-01-02: 5 [IU] via SUBCUTANEOUS

## 2015-12-28 MED ORDER — ASPIRIN 81 MG PO CHEW: 324.0000 mg | CHEWABLE_TABLET | Freq: Once | ORAL | Status: DC

## 2015-12-28 MED ORDER — INSULIN GLARGINE 100 UNIT/ML ~~LOC~~ SOLN
60.0000 [IU] | Freq: Two times a day (BID) | SUBCUTANEOUS | Status: DC
  Administered 2015-12-29: 60 [IU] via SUBCUTANEOUS
  Filled 2015-12-28: qty 0.6

## 2015-12-28 MED ORDER — ACETAMINOPHEN 325 MG PO TABS
650.0000 mg | ORAL_TABLET | ORAL | Status: DC | PRN
  Administered 2015-12-29 – 2016-01-02 (×4): 650 mg via ORAL
  Filled 2015-12-28 (×5): qty 2

## 2015-12-28 MED ORDER — MORPHINE SULFATE (PF) 2 MG/ML IV SOLN
2.0000 mg | INTRAVENOUS | Status: DC | PRN
  Administered 2015-12-29: 2 mg via INTRAVENOUS
  Filled 2015-12-28: qty 1

## 2015-12-28 MED ORDER — ONDANSETRON HCL 4 MG/2ML IJ SOLN
4.0000 mg | Freq: Four times a day (QID) | INTRAMUSCULAR | Status: DC | PRN
  Administered 2015-12-29: 4 mg via INTRAVENOUS
  Filled 2015-12-28: qty 2

## 2015-12-28 MED ORDER — ASPIRIN EC 81 MG PO TBEC
81.0000 mg | DELAYED_RELEASE_TABLET | Freq: Every day | ORAL | Status: DC
  Administered 2015-12-29: 81 mg via ORAL
  Filled 2015-12-28 (×2): qty 1

## 2015-12-28 NOTE — ED Provider Notes (Signed)
8:54 PM Patient care assumed from [place], [place]. Patient with hx of CAD s/p PCI x2 and most recent cath in Jan 2016, NSTEMI, HLD, GERD, DM, and HTN presents for exertional chest pain with SOB, nausea, lightheadedness and diaphoresis. Symptoms relieved with SL NTG x 2. Patient was seen and admitted 5 days ago for similar symptoms. Discharged after normal cycled troponins. Was not seen by cardiology during this admission. EKG and troponin are reassuring today. Given recurrent symptoms close to recent admission will consult cardiology and discuss plan for further care. Patient followed by [facility] [place]. He has had no outpatient cardiology f/u since May 2016.  9:05 PM Spoke with Dr. Rosanna Randy regarding patient case. She recommends admission with cardiology consultation, likely stress test in AM. Will consult [facility] for admission.  9:21 PM Case discussed with Dr. Loleta Books. [facility] to admit.  Antonietta Breach, PA-C 12/28/15 2122

## 2015-12-28 NOTE — ED Notes (Signed)
Attempted report 

## 2015-12-28 NOTE — ED Provider Notes (Signed)
CSN: [id number]     Arrival date & time 12/28/15  1910 History   First MD Initiated Contact with Patient 12/28/15 1911     Chief Complaint  Patient presents with  . Chest Pain     (Consider location/radiation/quality/duration/timing/severity/associated sxs/prior Treatment) HPI  [name] [name] Is a 60 year old male who presents the emergency department with chief complaint of chest pressure and dizziness. The patient states that he woke up feeling pressure on his chest and feeling lightheaded every time he stands up today. The patient went to work and as he was doing some activity he began having severe pressure, shortness of breath, nausea, diaphoresis and felt that he was going to pass out. The patient took 2 nitroglycerin with relief of his symptoms. His coworkers called EMS and the patient was transported. The patient was seen 5 days ago for a similar complaint and had an inpatient observation. He has a past medical history of diabetes and was noted to have a blood sugar in the 400s today. He states that his blood sugar has been running in the low 100s. He has a history of coronary atherosclerosis and is status post stent placement. He continues to complain of heavy pressure on his chest. He has some radiation into the left shoulder. His shortness of breath has resolved. He denies chest pain. He denies unilateral leg swelling or other risk factors for pulmonary embolus. He denies any recent illnesses, fevers, chills, myalgias. He denies any neurologic symptoms. Past Medical History  Diagnosis Date  . Coronary atherosclerosis of native coronary artery     a. LAD & OM stenting;  b. 2007 Cath: nonobs dzs, patent stents;  c. 07/2012 neg MV, EF 61%.  d. 11/20/14: [place] s/p  PCI w/ DES to pLAD and DES to OM1   . Asthma   . Essential hypertension, benign   . TIA (transient ischemic attack) ~ 2010  . Anxiety   . Hypercholesteremia   . GERD (gastroesophageal reflux disease)   . OSA on CPAP   . Type  2 diabetes mellitus ([place])   . History of hiatal hernia   . Migraine   . Arthritis   . Lumbar herniated disc   . Chronic lower back pain   . History of gout   . Thrombocytopenia ([place])   . MI, old    Past Surgical History  Procedure Laterality Date  . Anterior cervical decomp/discectomy fusion  1998    "C3-4"  . Carpal tunnel release Bilateral 2005  . Shoulder arthroscopy Left ~ 2011  . Knee surgery Left 02/2012    "scraped; open"  . Neuroplasty / transposition ulnar nerve at elbow Right ~ 2012  . Left heart catheterization with coronary angiogram N/A 07/20/2012    Procedure: LEFT HEART CATHETERIZATION WITH CORONARY ANGIOGRAM;  Surgeon: [name], MD;  Location: [facility] CATH LAB;  Service: Cardiovascular;  Laterality: N/A;  . Coronary angioplasty with stent placement  2002; 2003; 11/20/2014    "I have 4 stents after today" (11/20/2014)  . Cardiac catheterization  "several"  . Coronary angioplasty    . Left heart catheterization with coronary angiogram N/A 11/20/2014    Procedure: LEFT HEART CATHETERIZATION WITH CORONARY ANGIOGRAM;  Surgeon: [name], MD;  Location: [facility] CATH LAB;  Service: Cardiovascular;  Laterality: N/A;  . Percutaneous coronary rotoblator intervention (pci-r)  11/20/2014    Procedure: PERCUTANEOUS CORONARY ROTOBLATOR INTERVENTION (PCI-R);  Surgeon: [name], MD;  Location: [facility] CATH LAB;  Service: Cardiovascular;;  . Left  heart catheterization with coronary angiogram N/A 11/26/2014    Procedure: LEFT HEART CATHETERIZATION WITH CORONARY ANGIOGRAM;  Surgeon: [name], MD;  Location: [facility] CATH LAB;  Service: Cardiovascular;  Laterality: N/A;   Family History  Problem Relation Age of Onset  . Coronary artery disease Father 23   Social History  Substance Use Topics  . Smoking status: Never Smoker   . Smokeless tobacco: Never Used  . Alcohol Use: No    Review of Systems  Ten systems reviewed and are negative for acute change, except as noted in the HPI.     Allergies  Imdur; Statins; and Tricor  Home Medications   Prior to Admission medications   Medication Sig Start Date End Date Taking? Authorizing Provider  albuterol (PROVENTIL HFA;VENTOLIN HFA) 108 (90 BASE) MCG/ACT inhaler Inhale 2 puffs into the lungs every 6 (six) hours as needed for wheezing or shortness of breath.    Historical Provider, MD  amLODipine (NORVASC) 2.5 MG tablet TAKE 1 TABLET BY MOUTH DAILY 11/05/15   [name], MD  aspirin EC 81 MG tablet Take 81 mg by mouth at bedtime.     Historical Provider, MD  citalopram (CELEXA) 20 MG tablet Take 20 mg by mouth at bedtime.     Historical Provider, MD  clopidogrel (PLAVIX) 75 MG tablet Take 75 mg by mouth at bedtime.    Historical Provider, MD  fluticasone (FLONASE) 50 MCG/ACT nasal spray Place 1 spray into the nose 2 (two) times daily.     Historical Provider, MD  Fluticasone-Salmeterol (ADVAIR) 250-50 MCG/DOSE AEPB Inhale 1 puff into the lungs every 12 (twelve) hours.    Historical Provider, MD  gabapentin (NEURONTIN) 400 MG capsule Take 400 mg by mouth 3 (three) times daily.    Historical Provider, MD  insulin aspart (NOVOLOG) 100 UNIT/ML injection Inject 2-30 Units into the skin 3 (three) times daily before meals. Per SSI    Historical Provider, MD  Insulin Glargine (LANTUS SOLOSTAR) 100 UNIT/ML Solostar Pen Inject 60 Units into the skin 2 (two) times daily.    Historical Provider, MD  lamoTRIgine (LAMICTAL) 25 MG tablet Take 25 mg by mouth daily.    Historical Provider, MD  Melatonin 5 MG TABS Take 10 mg by mouth at bedtime.     Historical Provider, MD  metFORMIN (GLUCOPHAGE) 1000 MG tablet TAKE 1 TABLET BY MOUTH TWICE DAILY 12/10/14   [name], MD  metoprolol (LOPRESSOR) 50 MG tablet Take 1 tablet (50 mg total) by mouth daily. 06/04/15   [name], MD  nitroGLYCERIN (NITROSTAT) 0.4 MG SL tablet Place 0.4 mg under the tongue every 5 (five) minutes as needed for chest pain.     Historical Provider, MD   pantoprazole (PROTONIX) 20 MG tablet Take 20 mg by mouth 2 (two) times daily before a meal.    Historical Provider, MD  ramipril (ALTACE) 5 MG tablet Take 5 mg by mouth at bedtime.     Historical Provider, MD  terbinafine (LAMISIL) 250 MG tablet Take 250 mg by mouth daily.    Historical Provider, MD   BP 124/78 mmHg  Pulse 80  Temp(Src) 98.7 F (37.1 C) (Oral)  Resp 15  SpO2 92% Physical Exam  Constitutional: He is oriented to person, place, and time. He appears well-developed and well-nourished. No distress.  HENT:  Head: Normocephalic and atraumatic.  Eyes: Conjunctivae are normal. No scleral icterus.  Neck: Normal range of motion. Neck supple.  Cardiovascular: Normal rate, regular rhythm,  normal heart sounds and intact distal pulses.  Exam reveals no gallop.   No murmur heard. Pulmonary/Chest: Effort normal and breath sounds normal. No respiratory distress. He exhibits no tenderness.  Abdominal: Soft. He exhibits no distension and no mass. There is no tenderness. There is no guarding.  Musculoskeletal: He exhibits no edema.  Neurological: He is alert and oriented to person, place, and time.  Skin: Skin is warm and dry. He is not diaphoretic.  Psychiatric: His behavior is normal.  Nursing note and vitals reviewed.   ED Course  Procedures (including critical care time) Labs Review Labs Reviewed  CBC  COMPREHENSIVE METABOLIC PANEL  I-STAT [place], ED    Imaging Review No results found. I have personally reviewed and evaluated these images and lab results as part of my medical decision-making.   EKG Interpretation None       ECG interpretation   Date: 12/28/2015  Rate: 76  Rhythm: normal sinus rhythm  QRS Axis: normal  Intervals: normal  ST/T Wave abnormalities: normal  Conduction Disutrbances: none  Narrative Interpretation:   Old EKG Reviewed: No significant changes noted     MDM   Final diagnoses:  None    7:34 PM Patient with sxs concerning fo  possible ACS.  He is also significantly hyperglycemic.  Patient given in sign out to  [place].  Margarita Mail, PA-C 12/28/15 2033  Dorie Rank, MD 12/28/15 2114

## 2015-12-28 NOTE — H&P (Signed)
History and Physical  Patient Name: [name] [name]     ZOX:[id number]    DOB: 04-Mar-1956    DOA: 12/28/2015 Referring physician: Antonietta Breach, PA-C PCP: Deloria Lair, MD      Chief Complaint: Chest pain  HPI: [name] [name] is a 60 y.o. male with a past medical history significant for CAD s/p  who presents with chest pain.  The patient's complaint started with dyspnea on exertion beginning about one week ago, associated with chest discomfort.  The discomfort is located in the center chest, is vague in character, and is moderate in intensity.  He was admitted for observation overnight several days ago, had normal ECG, serial troponins, and was asked to see his Cardiologist in follow up, which is not scheduled until Mar 10.    In the meantime, the dyspnea and chest discomfort continue and were worse today.  Tonight, he went in to work at [employer], and appeared pale, clammy, "blotchy", and diaphoretic to wife and co-workers.  He had to rest on a cart at work, and was only able to walk a quarter of the way around the store before he had to stop and rest, so he was sent to the ER.  In the ED, the patient's initial ECG showed NSR without ST changes and troponin was negative.  TRH was asked to admit for observation, serial troponins and risk stratification.  He denied fever, cough, wheezing.  He has had no leg swelling, orthopnea, or PND.       Review of Systems:  All other systems negative except as just noted or noted in the history of present illness.  Allergies  Allergen Reactions  . Imdur [Isosorbide Dinitrate] Other (See Comments)    Severe headache  . Statins Other (See Comments)    Muscle aches and cramps  . Tricor [Fenofibrate] Other (See Comments)    Leg cramps    Prior to Admission medications   Medication Sig Start Date End Date Taking? Authorizing Provider  acetaminophen (TYLENOL) 500 MG tablet Take 1,000 mg by mouth every 6 (six) hours as needed (pain).   Yes Historical  Provider, MD  albuterol (PROVENTIL HFA;VENTOLIN HFA) 108 (90 BASE) MCG/ACT inhaler Inhale 2 puffs into the lungs every 6 (six) hours as needed for wheezing or shortness of breath.   Yes Historical Provider, MD  albuterol (PROVENTIL) (2.5 MG/3ML) 0.083% nebulizer solution Take 2.5 mg by nebulization every 6 (six) hours as needed for wheezing or shortness of breath.   Yes Historical Provider, MD  amLODipine (NORVASC) 2.5 MG tablet TAKE 1 TABLET BY MOUTH DAILY 11/05/15  Yes [name], MD  aspirin EC 81 MG tablet Take 81 mg by mouth at bedtime.    Yes Historical Provider, MD  citalopram (CELEXA) 20 MG tablet Take 20 mg by mouth at bedtime.    Yes Historical Provider, MD  clopidogrel (PLAVIX) 75 MG tablet Take 75 mg by mouth daily with breakfast.    Yes Historical Provider, MD  fluticasone (FLONASE) 50 MCG/ACT nasal spray Place 1 spray into the nose 2 (two) times daily.    Yes Historical Provider, MD  Fluticasone-Salmeterol (ADVAIR) 250-50 MCG/DOSE AEPB Inhale 1 puff into the lungs every 12 (twelve) hours.   Yes Historical Provider, MD  gabapentin (NEURONTIN) 400 MG capsule Take 400 mg by mouth 3 (three) times daily.   Yes Historical Provider, MD  ibuprofen (ADVIL,MOTRIN) 200 MG tablet Take 400-800 mg by mouth every 6 (six) hours as needed (pain).   Yes  Historical Provider, MD  insulin aspart (NOVOLOG FLEXPEN) 100 UNIT/ML FlexPen Inject 1-15 Units into the skin 3 (three) times daily as needed for high blood sugar (CBG >120). Per sliding scale written down at home   Yes Historical Provider, MD  Insulin Glargine (LANTUS SOLOSTAR) 100 UNIT/ML Solostar Pen Inject 60 Units into the skin 2 (two) times daily.   Yes Historical Provider, MD  Melatonin 5 MG TABS Take 10 mg by mouth at bedtime.    Yes Historical Provider, MD  metFORMIN (GLUCOPHAGE) 1000 MG tablet TAKE 1 TABLET BY MOUTH TWICE DAILY 12/10/14  Yes [name], MD  metoprolol (LOPRESSOR) 50 MG tablet Take 1 tablet (50 mg total) by mouth  daily. Patient taking differently: Take 50 mg by mouth at bedtime.  06/04/15  Yes [name], MD  nitroGLYCERIN (NITROSTAT) 0.4 MG SL tablet Place 0.4 mg under the tongue every 5 (five) minutes as needed for chest pain.    Yes Historical Provider, MD  omeprazole (PRILOSEC) 20 MG capsule Take 20 mg by mouth 2 (two) times daily before a meal.   Yes Historical Provider, MD  ramipril (ALTACE) 5 MG tablet Take 5 mg by mouth at bedtime.    Yes Historical Provider, MD  terbinafine (LAMISIL) 250 MG tablet Take 250 mg by mouth at bedtime. For toe fungus   Yes Historical Provider, MD    Past Medical History  Diagnosis Date  . Coronary atherosclerosis of native coronary artery     a. LAD & OM stenting;  b. 2007 Cath: nonobs dzs, patent stents;  c. 07/2012 neg MV, EF 61%.  d. 11/20/14: [place] s/p  PCI w/ DES to pLAD and DES to OM1   . Asthma   . Essential hypertension, benign   . TIA (transient ischemic attack) ~ 2010  . Anxiety   . Hypercholesteremia   . GERD (gastroesophageal reflux disease)   . OSA on CPAP   . Type 2 diabetes mellitus ([place])   . History of hiatal hernia   . Migraine   . Arthritis   . Lumbar herniated disc   . Chronic lower back pain   . History of gout   . Thrombocytopenia ([place])   . MI, old     Past Surgical History  Procedure Laterality Date  . Anterior cervical decomp/discectomy fusion  1998    "C3-4"  . Carpal tunnel release Bilateral 2005  . Shoulder arthroscopy Left ~ 2011  . Knee surgery Left 02/2012    "scraped; open"  . Neuroplasty / transposition ulnar nerve at elbow Right ~ 2012  . Left heart catheterization with coronary angiogram N/A 07/20/2012    Procedure: LEFT HEART CATHETERIZATION WITH CORONARY ANGIOGRAM;  Surgeon: [name], MD;  Location: [facility] CATH LAB;  Service: Cardiovascular;  Laterality: N/A;  . Coronary angioplasty with stent placement  2002; 2003; 11/20/2014    "I have 4 stents after today" (11/20/2014)  . Cardiac catheterization  "several"   . Coronary angioplasty    . Left heart catheterization with coronary angiogram N/A 11/20/2014    Procedure: LEFT HEART CATHETERIZATION WITH CORONARY ANGIOGRAM;  Surgeon: [name], MD;  Location: [facility] CATH LAB;  Service: Cardiovascular;  Laterality: N/A;  . Percutaneous coronary rotoblator intervention (pci-r)  11/20/2014    Procedure: PERCUTANEOUS CORONARY ROTOBLATOR INTERVENTION (PCI-R);  Surgeon: [name], MD;  Location: [facility] CATH LAB;  Service: Cardiovascular;;  . Left heart catheterization with coronary angiogram N/A 11/26/2014    Procedure: LEFT HEART CATHETERIZATION WITH  CORONARY ANGIOGRAM;  Surgeon: [name], MD;  Location: [facility] CATH LAB;  Service: Cardiovascular;  Laterality: N/A;    Family history: family history includes Coronary artery disease (age of onset: 38) in his father.  Social History: Patient lives with his wife and children.  He works at [employer].  He lives in [place].  He is from [place] originally.  He is not a smoker.  He is fully functional and ambulatory at baseline.       Physical Exam: BP 124/78 mmHg  Pulse 80  Temp(Src) 98.7 F (37.1 C) (Oral)  Resp 15  SpO2 92% General appearance: Well-developed, adult male, alert and in no acute distress.  Conversational.   Eyes: Anicteric, conjunctiva pink, lids and lashes normal.     ENT: No nasal deformity, discharge, or epistaxis.  OP moist without lesions.   Skin: Warm and dry.   Cardiac: RRR, nl S1-S2, no murmurs appreciated.  Capillary refill is brisk.  JVP normal.  No LE edema.  Radial pulses 2+ and symmetric.  No carotid bruits. Respiratory: Normal respiratory rate and rhythm.  CTAB without rales or wheezes. Abdomen: Abdomen soft without rigidity.  No TTP. No ascites, distension.   MSK: No deformities or effusions.   Pain not reproduced with palpation of precordium.  No calf swelling, redness or pain. Neuro: Sensorium intact and responding to questions, attention normal.  Speech is fluent.  Moves all  extremities equally and with normal coordination.    Psych: Behavior appropriate.  Affect normal.  No evidence of aural or visual hallucinations or delusions.       Labs on Admission:  The metabolic panel shows mild elevation of BUN/creatinine ratio, and hyperglycemia. The transaminases and bilirubin are normal. The complete blood count shows slight cold leukopenia, mild thrombocytopenia, both of which are chronic The initial troponin is negative.  Radiological Exams on Admission: Personally reviewed: Dg Chest Port 1 View 12/28/2015  CLINICAL DATA:  Shortness of breath with chest pressure. Lightheadedness today. EXAM: PORTABLE CHEST 1 VIEW COMPARISON:  Frontal and lateral views 12/23/2015 FINDINGS: The cardiomediastinal contours are normal. Cardiac stent in place. The lungs are clear. Pulmonary vasculature is normal. No consolidation, pleural effusion, or pneumothorax. No acute osseous abnormalities are seen. IMPRESSION: No acute pulmonary process. Electronically Signed   By: Jeb Levering M.D.   On: 12/28/2015 20:08    EKG: Independently reviewed. NSR, rate 76, QTc 422.  No ST changes.     LHC 03/10/11: PCI to prox LAD  LHC 03/25/11: No remaining disease  LHC 07/20/12: No obstructive disease  LHC 11/20/14: Instent restenosis, with PCI x2 to prox LAD and first OM  LHC 11/26/14:  Non obstructive disease       Assessment/Plan 1. Chest pain: This is new.  The patient has HEART score of 5. GRACE score 76, low.  Typical angina is present.  We have been asked to admit the patient for observation and etiology consultation with Cardiology tomorrow. Not clear that this isn't an asthma/COPD flare.  PE less likely than angina, and [name] score low. -Serial troponins are ordered -Telemetry -Echocardiogram ordered to eval for wall motion abnl -Consult to cardiology, appreciate recommendations -Check d-dimer   2. HTN and CAD secondary prevention:  Normotensive at admission.  Not on  statin. -Continue ramipril and CCB and aspirin and Plavix -Hold metoprolol for possible stress, resume per Cards  3. OSA:  -CPAP at night  4. IDDM with neuropathy:  Hyperglycemic at admission -Hold metformin while inpatient -Continue Glargine -  Sliding scale corrections -Continue gabapentin  5. Obstructive lung disease:  Not wheezing at admission. -Continue home Advair  6. Mood:  Stable.  -Continue home citalopram     DVT PPx: Outpatient status Diet: NPO after 4am, may advance diet if no stress testing Consultants: Cardiology Code Status: Full Family Communication: Wife and three children present at bedside. Patient seen 9:57 PM on 12/28/2015.  Disposition Plan:  Observe for arrhythmia on telemetry, serial troponins and subsequent risk stratification by Cardiology.  If testing negative, home after.      Edwin Dada [facility] Pager [phone]

## 2015-12-28 NOTE — ED Notes (Signed)
Per EMS pt from home started having CP this morning that progressively increased significantly when walking around [employer]. Pt had associated ShOB and dizziness. Patient took 2 of his own SL nitroglycerin with moderate relief. EMS administered 324 of ASA and 3 more nitroglycerin which took pain from 4/10 to 1/10. Pt placed on 2L of O2 [place] with relief of shortness of breath.   Patient is diabetic and has hx of stents.  Patient initial CBG was 426 given appx 469m of NS last CBG 347.

## 2015-12-29 ENCOUNTER — Encounter (HOSPITAL_COMMUNITY): Payer: Self-pay | Admitting: Internal Medicine

## 2015-12-29 ENCOUNTER — Observation Stay (HOSPITAL_BASED_OUTPATIENT_CLINIC_OR_DEPARTMENT_OTHER): Payer: Medicare Other

## 2015-12-29 DIAGNOSIS — I2510 Atherosclerotic heart disease of native coronary artery without angina pectoris: Secondary | ICD-10-CM

## 2015-12-29 DIAGNOSIS — E1100 Type 2 diabetes mellitus with hyperosmolarity without nonketotic hyperglycemic-hyperosmolar coma (NKHHC): Secondary | ICD-10-CM | POA: Diagnosis not present

## 2015-12-29 DIAGNOSIS — E785 Hyperlipidemia, unspecified: Secondary | ICD-10-CM | POA: Diagnosis present

## 2015-12-29 DIAGNOSIS — I25110 Atherosclerotic heart disease of native coronary artery with unstable angina pectoris: Secondary | ICD-10-CM | POA: Diagnosis not present

## 2015-12-29 DIAGNOSIS — R079 Chest pain, unspecified: Secondary | ICD-10-CM | POA: Diagnosis not present

## 2015-12-29 DIAGNOSIS — I10 Essential (primary) hypertension: Secondary | ICD-10-CM | POA: Diagnosis not present

## 2015-12-29 LAB — TROPONIN I
Troponin I: <0.03 ng/mL (ref <0.031)
Troponin I: <0.03 ng/mL (ref <0.031)

## 2015-12-29 LAB — GLUCOSE, CAPILLARY
Glucose-Capillary: 147 mg/dL — ABNORMAL HIGH (ref 65–99)
Glucose-Capillary: 241 mg/dL — ABNORMAL HIGH (ref 65–99)
Glucose-Capillary: 240 mg/dL — ABNORMAL HIGH (ref 65–99)
Glucose-Capillary: 263 mg/dL — ABNORMAL HIGH (ref 65–99)

## 2015-12-29 MED ORDER — SODIUM CHLORIDE 0.9% FLUSH
3.0000 mL | INTRAVENOUS | Status: DC | PRN
Start: 2015-12-29 — End: 2015-12-30

## 2015-12-29 MED ORDER — PERFLUTREN LIPID MICROSPHERE
1.0000 mL | INTRAVENOUS | Status: AC | PRN
  Administered 2015-12-29: 4 mL via INTRAVENOUS
  Filled 2015-12-29: qty 10

## 2015-12-29 MED ORDER — SODIUM CHLORIDE 0.9 % WEIGHT BASED INFUSION
1.0000 mL/kg/h | INTRAVENOUS | Status: DC
  Administered 2015-12-29 – 2015-12-30 (×2): 1 mL/kg/h via INTRAVENOUS

## 2015-12-29 MED ORDER — SODIUM CHLORIDE 0.9% FLUSH
3.0000 mL | Freq: Two times a day (BID) | INTRAVENOUS | Status: DC
  Administered 2015-12-29: 3 mL via INTRAVENOUS

## 2015-12-29 MED ORDER — SODIUM CHLORIDE 0.9 % IV SOLN: 250.0000 mL | INTRAVENOUS | Status: DC | PRN

## 2015-12-29 MED ORDER — METOPROLOL TARTRATE 50 MG PO TABS
50.0000 mg | ORAL_TABLET | Freq: Two times a day (BID) | ORAL | Status: DC
  Administered 2015-12-29 – 2016-01-02 (×9): 50 mg via ORAL
  Filled 2015-12-29 (×9): qty 1

## 2015-12-29 MED ORDER — INSULIN GLARGINE 100 UNIT/ML ~~LOC~~ SOLN
30.0000 [IU] | Freq: Two times a day (BID) | SUBCUTANEOUS | Status: DC
  Administered 2015-12-30 – 2016-01-02 (×6): 30 [IU] via SUBCUTANEOUS
  Filled 2015-12-29 (×7): qty 0.3

## 2015-12-29 MED ORDER — INSULIN GLARGINE 100 UNIT/ML ~~LOC~~ SOLN
20.0000 [IU] | Freq: Once | SUBCUTANEOUS | Status: AC
  Administered 2015-12-29: 20 [IU] via SUBCUTANEOUS
  Filled 2015-12-29: qty 0.2

## 2015-12-29 MED ORDER — INSULIN GLARGINE 100 UNIT/ML ~~LOC~~ SOLN
30.0000 [IU] | Freq: Two times a day (BID) | SUBCUTANEOUS | Status: DC
  Administered 2015-12-29: 30 [IU] via SUBCUTANEOUS
  Filled 2015-12-29 (×2): qty 0.3

## 2015-12-29 NOTE — Progress Notes (Signed)
Patient is refusing to wear CPAP at this time. RT informed patient if he changes his mind have RN contact RT. RN aware.

## 2015-12-29 NOTE — Progress Notes (Signed)
Patient Demographics:    [name] [name], is a 60 y.o. male, DOB - 08/05/1956, AES:[id number]  Admit date - 12/28/2015   Admitting Physician Edwin Dada, MD  Outpatient Primary MD for the patient is Deloria Lair, MD  LOS -    Chief Complaint  Patient presents with  . Chest Pain        Subjective:    [name] [name] today has, No headache, No chest pain, No abdominal pain - No Nausea, No new weakness tingling or numbness, No Cough - SOB.     Assessment  & Plan :     1. Chest Pain in a patient with history of CAD - appears to be classic/typical in terms of symptoms, ruled out for MI, stable d-dimer, EKG was nonacute, seen by cardiology, currently on aspirin, Plavix and home beta blocker, currently chest pain-free, per cardiology due for left heart cath on 12/30/2015.  2. Essential hypertension. Blood pressure stable on combination of Norvasc, ACE inhibitor, resume home dose beta blocker.  3. GERD. On PPI continue.  4. Diabetic neuropathy. On Neurontin home dose. Will be continued unchanged.   5. DM type II. On Lantus and sliding scale continue to monitor.  Lab Results  Component Value Date   HGBA1C 7.9* 12/23/2015    CBG (last 3)   Recent Labs  12/28/15 2305 12/29/15 0750  GLUCAP 158* 147*      Code Status : Full  Family Communication  : None Present  Disposition Plan  :  Home likely post Cath  Consults  :  Cards  Procedures  :   L Heart Cath 12-30-15  DVT Prophylaxis  :  Lovenox   Lab Results  Component Value Date   PLT 106* 12/28/2015    Inpatient Medications  Scheduled Meds: . amLODipine  2.5 mg Oral Daily  . aspirin  324 mg Oral Once  . aspirin EC  81 mg Oral QHS  . citalopram  20 mg Oral QHS  . clopidogrel  75 mg Oral Daily  . enoxaparin (LOVENOX)  injection  40 mg Subcutaneous Q24H  . gabapentin  400 mg Oral TID  . insulin aspart  0-15 Units Subcutaneous TID WC  . insulin aspart  0-5 Units Subcutaneous QHS  . insulin glargine  30 Units Subcutaneous BID  . mometasone-formoterol  2 puff Inhalation BID  . pantoprazole  40 mg Oral Daily  . ramipril  5 mg Oral QHS  . terbinafine  250 mg Oral QHS   Continuous Infusions:  PRN Meds:.acetaminophen, gi cocktail, morphine injection, ondansetron (ZOFRAN) IV  Antibiotics  :     Anti-infectives    Start     Dose/Rate Route Frequency Ordered Stop   12/28/15 2315  terbinafine (LAMISIL) tablet 250 mg     250 mg Oral Daily at bedtime 12/28/15 2306          Objective:   Filed Vitals:   12/29/15 0039 12/29/15 0600 12/29/15 0750 12/29/15 0846  BP: 123/72 122/76  142/80  Pulse: 70 68    Temp: 98 F (36.7 C) 98.3 F (36.8 C) 98 F (36.7 C)   TempSrc:  Oral Oral   Resp: 13 20    Height:      Weight:  104.509  kg (230 lb 6.4 oz)    SpO2: 98% 94%      Wt Readings from Last 3 Encounters:  12/29/15 104.509 kg (230 lb 6.4 oz)  12/23/15 103.783 kg (228 lb 12.8 oz)  10/29/15 106.595 kg (235 lb)     Intake/Output Summary (Last 24 hours) at 12/29/15 0917 Last data filed at 12/29/15 0000  Gross per 24 hour  Intake    240 ml  Output    350 ml  Net   -110 ml     Physical Exam  Awake Alert, Oriented X 3, No new F.N deficits, Normal affect [place].AT,PERRAL Supple Neck,No JVD, No cervical lymphadenopathy appriciated.  Symmetrical Chest wall movement, Good air movement bilaterally, CTAB RRR,No Gallops,Rubs or new Murmurs, No Parasternal Heave +ve B.Sounds, Abd Soft, No tenderness, No organomegaly appriciated, No rebound - guarding or rigidity. No Cyanosis, Clubbing or edema, No new Rash or bruise     Data Review:   Micro Results No results found for this or any previous visit (from the past 240 hour(s)).  Radiology Reports Dg Chest 2 View  12/23/2015  CLINICAL DATA:  60 year old  male with left-sided chest pain since earlier this morning EXAM: CHEST  2 VIEW COMPARISON:  Prior chest x-ray 12/03/2014 FINDINGS: The lungs are clear and negative for focal airspace consolidation, pulmonary edema or suspicious pulmonary nodule. No pleural effusion or pneumothorax. Cardiac and mediastinal contours are within normal limits. Metallic stent projects the left heart. No acute fracture or lytic or blastic osseous lesions. The visualized upper abdominal bowel gas pattern is unremarkable. IMPRESSION: No active cardiopulmonary disease. Electronically Signed   By: Jacqulynn Cadet M.D.   On: 12/23/2015 18:46   Dg Chest Port 1 View  12/28/2015  CLINICAL DATA:  Shortness of breath with chest pressure. Lightheadedness today. EXAM: PORTABLE CHEST 1 VIEW COMPARISON:  Frontal and lateral views 12/23/2015 FINDINGS: The cardiomediastinal contours are normal. Cardiac stent in place. The lungs are clear. Pulmonary vasculature is normal. No consolidation, pleural effusion, or pneumothorax. No acute osseous abnormalities are seen. IMPRESSION: No acute pulmonary process. Electronically Signed   By: Jeb Levering M.D.   On: 12/28/2015 20:08     CBC  Recent Labs Lab 12/23/15 1935 12/28/15 1943  WBC 3.2* 3.5*  HGB 14.9 14.3  HCT 41.4 41.1  PLT 107* 106*  MCV 89.8 90.1  MCH 32.3 31.4  MCHC 36.0 34.8  RDW 12.8 12.9    Chemistries   Recent Labs Lab 12/23/15 1935 12/28/15 1943  NA 140 139  K 4.1 4.5  CL 105 106  CO2 27 22  GLUCOSE 251* 235*  BUN 19 21*  CREATININE 0.85 1.11  CALCIUM 9.0 9.4  AST  --  33  ALT  --  43  ALKPHOS  --  40  BILITOT  --  0.7   ------------------------------------------------------------------------------------------------------------------ No results for input(s): CHOL, HDL, LDLCALC, TRIG, CHOLHDL, LDLDIRECT in the last 72 hours.  Lab Results  Component Value Date   HGBA1C 7.9* 12/23/2015    ------------------------------------------------------------------------------------------------------------------ No results for input(s): TSH, T4TOTAL, T3FREE, THYROIDAB in the last 72 hours.  Invalid input(s): FREET3 ------------------------------------------------------------------------------------------------------------------ No results for input(s): VITAMINB12, FOLATE, FERRITIN, TIBC, IRON, RETICCTPCT in the last 72 hours.  Coagulation profile No results for input(s): INR, PROTIME in the last 168 hours.   Recent Labs  12/28/15 2300  DDIMER <0.27    Cardiac Enzymes  Recent Labs Lab 12/28/15 2320 12/29/15 0132 12/29/15 0458  TROPONINI <0.03 <0.03 <0.03   ------------------------------------------------------------------------------------------------------------------  Component Value Date/Time   BNP 10.6 12/03/2014 1640    Time Spent in minutes  35   Teah Votaw K M.D on 12/29/2015 at 9:17 AM  Between 7am to 7pm - Pager - [phone]  After 7pm go to [email or web address] - password [facility]  [facility] -  Office  [phone]

## 2015-12-29 NOTE — Consult Note (Addendum)
Primary cardiologist:  Subjective:   Mild CP overnight   Objective:   Temp:  [97.7 F (36.5 C)-98.7 F (37.1 C)] 98 F (36.7 C) (02/19 0750) Pulse Rate:  [65-80] 74 (02/19 1025) Resp:  [11-21] 20 (02/19 0600) BP: (122-151)/(67-86) 144/83 mmHg (02/19 1025) SpO2:  [92 %-99 %] 94 % (02/19 0600) Weight:  [228 lb 6.4 oz (103.602 kg)-230 lb 6.4 oz (104.509 kg)] 230 lb 6.4 oz (104.509 kg) (02/19 0600) Last BM Date: 12/22/15  Filed Weights   12/28/15 2300 12/29/15 0600  Weight: 228 lb 6.4 oz (103.602 kg) 230 lb 6.4 oz (104.509 kg)    Intake/Output Summary (Last 24 hours) at 12/29/15 1058 Last data filed at 12/29/15 0000  Gross per 24 hour  Intake    240 ml  Output    350 ml  Net   -110 ml     Exam:  General: NAD  HEENT: sclera clear, throat clear  Resp: CTAB  Cardiac: RRR, no m/r/g, no jvd  GI: abdomen soft, NT, ND  MSK: no LE edema  Neuro: no focal deficits  Psych: appropriate affect  Lab Results:  Basic Metabolic Panel:  Recent Labs Lab 12/23/15 1935 12/28/15 1943  NA 140 139  K 4.1 4.5  CL 105 106  CO2 27 22  GLUCOSE 251* 235*  BUN 19 21*  CREATININE 0.85 1.11  CALCIUM 9.0 9.4    Liver Function Tests:  Recent Labs Lab 12/28/15 1943  AST 33  ALT 43  ALKPHOS 40  BILITOT 0.7  PROT 6.5  ALBUMIN 3.5    CBC:  Recent Labs Lab 12/23/15 1935 12/28/15 1943  WBC 3.2* 3.5*  HGB 14.9 14.3  HCT 41.4 41.1  MCV 89.8 90.1  PLT 107* 106*    Cardiac Enzymes:  Recent Labs Lab 12/28/15 2320 12/29/15 0132 12/29/15 0458  TROPONINI <0.03 <0.03 <0.03    BNP: No results for input(s): PROBNP in the last 8760 hours.  Coagulation: No results for input(s): INR in the last 168 hours.  ECG:   Medications:   Scheduled Medications: . amLODipine  2.5 mg Oral Daily  . aspirin  324 mg Oral Once  . aspirin EC  81 mg Oral QHS  . citalopram  20 mg Oral QHS  . clopidogrel  75 mg Oral Daily  . enoxaparin (LOVENOX) injection  40 mg  Subcutaneous Q24H  . gabapentin  400 mg Oral TID  . insulin aspart  0-15 Units Subcutaneous TID WC  . insulin aspart  0-5 Units Subcutaneous QHS  . insulin glargine  30 Units Subcutaneous BID  . metoprolol tartrate  50 mg Oral BID  . mometasone-formoterol  2 puff Inhalation BID  . pantoprazole  40 mg Oral Daily  . ramipril  5 mg Oral QHS  . terbinafine  250 mg Oral QHS     Infusions:     PRN Medications:  acetaminophen, gi cocktail, morphine injection, ondansetron (ZOFRAN) IV     Assessment/Plan    1. Chest pain/CAD - history of CAD with prior stenting - 11/2013 Nuclear stress negative for ischemia - 11/2014 cath with patent stents with distal LAD 50% - symptoms worrisome for progressing angina and DOE, second admit in last 2 weeks with similar symptoms. Has not had evidence of ACS by EKG or enzymes. Better with prn NG.  - medical therapy with ASA, plavix, lopressor, ramipril. He is not tolerant to statins  - given his history, fairly typical symptoms of chest pain and DOE that  are significantly progressing we will plan for cath tomorrow to best evaluate his coronaries. Pretest probability too high for noninvasive testing.        Carlyle Dolly, M.D.

## 2015-12-29 NOTE — Consult Note (Addendum)
Cardiology Consult    Patient ID: [name] [name] MRN: [id number], DOB/AGE: 1956/01/06  Admit date: 12/28/2015 Date of Consult: 12/29/2015 Primary Physician: Deloria Lair, MD Primary Cardiologist:  Dr. Domenic Polite Requesting Provider: Myrene Buddy  HPI    60 yo M pt of Dr. Domenic Polite [facility] office) w ah/o CAD, DM, HTN, HLD, TIA (2010), OSA on CPAP, Gout, Thrombocytopenia, & Anxiety presented the second time in one week with CP, having been evaluated overnight 12/23/15 to 12/24/15.  Today's episode involved similar symptoms to what he had the week prior - severe, non-radiating chest pressure associated with dyspnea, diaphoresis, & nausea.  It occurred while he was working at [employer] seemed to worsen when he moved.  The distinctive feature of today's episode that prompted him to seek evaluation was severe lightheadedness, which he had not previously experienced.  He denied palpitations, fevers, dehydration, or diarrhea.  He took SL NTG x 2, which moderately improved his pain.  EMS administered ASA 324 & SL NTG x 3, which resolved his pain.   He had a similar but milder episode of exertional pain not relieved with NTG x 3 two days prior as well as 6 days prior when he had been to the ED.  Prior to these recent episodes, he had chest pain with vigorous exertion but never required NTG.  He noted recent physcial stress as as Andrey Cota is liquidating.  So, his usual weekly hours had previously been 25 but are now closer to 50.    During his most recent presentation, his EKG was unremarkable, & his serial cardiac biomarkers were negative.  He was unable to follow-up with Dr. Domenic Polite since his last presentation as he could not get an appointment until 01/17/16.  With tonight's presentation, his EKG & troponin were again negative.  While in the ED tonight, he was given ASA 324 , NS 1 L, Toradol 15 mg, & Morphine 4 mg.    Relevant Data - CXR:  Unremarkable - Labs:  Troponin negative (23:20) - EKG:  NSR, no  ST-T abnormalities - TTE (11/26/14):  EF 55-60%, mild MR, RV mildly dilated   - LHC (11/27/14, [facility]):  Patent pLAD stent, dLAD 50% unchanged from prior, patent OM1 stent - PCI  (11/20/14, [facility]):  DES to pLAD  & DES to OM1 - LHC (07/20/12):  Patent stent, no obstructive disease - LHC (03/25/11):  Patent stent, no obstructive disease - PCI (03/10/11) Stent to pLAD - PCI (2001) Stent   Past Medical History   Past Medical History  Diagnosis Date  . Coronary atherosclerosis of native coronary artery     a. LAD & OM stenting;  b. 2007 Cath: nonobs dzs, patent stents;  c. 07/2012 neg MV, EF 61%.  d. 11/20/14: [place] s/p  PCI w/ DES to pLAD and DES to OM1   . Asthma   . Essential hypertension, benign   . TIA (transient ischemic attack) ~ 2010  . Anxiety   . Hypercholesteremia   . GERD (gastroesophageal reflux disease)   . OSA on CPAP   . Type 2 diabetes mellitus ([place])   . History of hiatal hernia   . Migraine   . Arthritis   . Lumbar herniated disc   . Chronic lower back pain   . History of gout   . Thrombocytopenia ([place])   . MI, old     Past Surgical History  Procedure Laterality Date  . Anterior cervical decomp/discectomy fusion  1998    "C3-4"  .  Carpal tunnel release Bilateral 2005  . Shoulder arthroscopy Left ~ 2011  . Knee surgery Left 02/2012    "scraped; open"  . Neuroplasty / transposition ulnar nerve at elbow Right ~ 2012  . Left heart catheterization with coronary angiogram N/A 07/20/2012    Procedure: LEFT HEART CATHETERIZATION WITH CORONARY ANGIOGRAM;  Surgeon: [name], MD;  Location: [facility] CATH LAB;  Service: Cardiovascular;  Laterality: N/A;  . Coronary angioplasty with stent placement  2002; 2003; 11/20/2014    "I have 4 stents after today" (11/20/2014)  . Cardiac catheterization  "several"  . Coronary angioplasty    . Left heart catheterization with coronary angiogram N/A 11/20/2014    Procedure: LEFT HEART CATHETERIZATION WITH CORONARY ANGIOGRAM;  Surgeon: [name]  [name], MD;  Location: [facility] CATH LAB;  Service: Cardiovascular;  Laterality: N/A;  . Percutaneous coronary rotoblator intervention (pci-r)  11/20/2014    Procedure: PERCUTANEOUS CORONARY ROTOBLATOR INTERVENTION (PCI-R);  Surgeon: [name], MD;  Location: [facility] CATH LAB;  Service: Cardiovascular;;  . Left heart catheterization with coronary angiogram N/A 11/26/2014    Procedure: LEFT HEART CATHETERIZATION WITH CORONARY ANGIOGRAM;  Surgeon: [name], MD;  Location: [facility] CATH LAB;  Service: Cardiovascular;  Laterality: N/A;    Allergies  Allergies  Allergen Reactions  . Imdur [Isosorbide Dinitrate] Other (See Comments)    Severe headache  . Statins Other (See Comments)    Muscle aches and cramps  . Tricor [Fenofibrate] Other (See Comments)    Leg cramps   Inpatient Medications    . amLODipine  2.5 mg Oral Daily  . aspirin  324 mg Oral Once  . aspirin EC  81 mg Oral QHS  . citalopram  20 mg Oral QHS  . clopidogrel  75 mg Oral Daily  . enoxaparin (LOVENOX) injection  40 mg Subcutaneous Q24H  . gabapentin  400 mg Oral TID  . insulin aspart  0-15 Units Subcutaneous TID WC  . insulin aspart  0-5 Units Subcutaneous QHS  . insulin glargine  30 Units Subcutaneous BID  . mometasone-formoterol  2 puff Inhalation BID  . pantoprazole  40 mg Oral Daily  . ramipril  5 mg Oral QHS  . terbinafine  250 mg Oral QHS   Family History    Family History  Problem Relation Age of Onset  . Coronary artery disease Father 55   Social History    Social History   Social History  . Marital Status: Married    Spouse Name: [name]  . Number of Children: 3  . Years of Education: 12 th    Occupational History  . Unemployed    Social History Main Topics  . Smoking status: Never Smoker   . Smokeless tobacco: Never Used  . Alcohol Use: No  . Drug Use: No  . Sexual Activity: Yes   Other Topics Concern  . Not on file   Social History Narrative   Patient lives at home with wife [name].     Patient works at [employer], Engineer, petroleum.   Patient has a 12 th grade education.    Patient has 3 children.           Review of Systems    General:  No chills, fever, night sweats or weight changes.  Cardiovascular:  Positive for chest pain, dyspnea, & lightheadedness.  No edema, orthopnea, palpitations, paroxysmal nocturnal dyspnea. Dermatological: No rash, lesions/masses Respiratory: No cough, dyspnea Urologic: No hematuria, dysuria Abdominal:   Positive for nausea.  No vomiting, diarrhea, bright red blood per rectum, melena, or hematemesis Neurologic:  No visual changes, wkns, changes in mental status. All other systems reviewed and are otherwise negative except as noted above.  Physical Exam    Blood pressure 122/76, pulse 68, temperature 98.3 F (36.8 C), temperature source Oral, resp. rate 20, height 5' 9"  (1.753 m), weight 104.509 kg (230 lb 6.4 oz), SpO2 94 %.  General: Pleasant, NAD Psych: Normal affect. Neuro: Alert and oriented X 3. Moves all extremities spontaneously. HEENT: Normal  Neck: Supple without bruits or JVD. Lungs:  Resp regular and unlabored, CTA. Heart: RRR no s3, s4, or murmurs. Abdomen: Soft, non-tender, non-distended, BS + x 4.  Extremities: No clubbing, cyanosis or edema. DP/PT/Radials 2+ and equal bilaterally.  Labs    Troponin [facility] Test)  Recent Labs  12/28/15 2012  TROPIPOC 0.00    Recent Labs  12/28/15 2320 12/29/15 0132 12/29/15 0458  TROPONINI <0.03 <0.03 <0.03   Lab Results  Component Value Date   WBC 3.5* 12/28/2015   HGB 14.3 12/28/2015   HCT 41.1 12/28/2015   MCV 90.1 12/28/2015   PLT 106* 12/28/2015    Recent Labs Lab 12/28/15 1943  NA 139  K 4.5  CL 106  CO2 22  BUN 21*  CREATININE 1.11  CALCIUM 9.4  PROT 6.5  BILITOT 0.7  ALKPHOS 40  ALT 43  AST 33  GLUCOSE 235*   Lab Results  Component Value Date   CHOL 202* 11/25/2014   HDL 27* 11/25/2014   LDLCALC UNABLE TO CALCULATE IF  TRIGLYCERIDE OVER 400 mg/dL 11/25/2014   TRIG 415* 11/25/2014   Lab Results  Component Value Date   DDIMER <0.27 12/28/2015   Radiology Studies    Dg Chest 2 View  12/23/2015  CLINICAL DATA:  60 year old male with left-sided chest pain since earlier this morning EXAM: CHEST  2 VIEW COMPARISON:  Prior chest x-ray 12/03/2014 FINDINGS: The lungs are clear and negative for focal airspace consolidation, pulmonary edema or suspicious pulmonary nodule. No pleural effusion or pneumothorax. Cardiac and mediastinal contours are within normal limits. Metallic stent projects the left heart. No acute fracture or lytic or blastic osseous lesions. The visualized upper abdominal bowel gas pattern is unremarkable. IMPRESSION: No active cardiopulmonary disease. Electronically Signed   By: Jacqulynn Cadet M.D.   On: 12/23/2015 18:46   Dg Chest Port 1 View  12/28/2015  CLINICAL DATA:  Shortness of breath with chest pressure. Lightheadedness today. EXAM: PORTABLE CHEST 1 VIEW COMPARISON:  Frontal and lateral views 12/23/2015 FINDINGS: The cardiomediastinal contours are normal. Cardiac stent in place. The lungs are clear. Pulmonary vasculature is normal. No consolidation, pleural effusion, or pneumothorax. No acute osseous abnormalities are seen. IMPRESSION: No acute pulmonary process. Electronically Signed   By: Jeb Levering M.D.   On: 12/28/2015 20:08   Assessment & Plan    60 yo M pt of Dr. Domenic Polite [facility] office) w ah/o CAD, DM, HTN, HLD, TIA (2010), OSA on CPAP, Gout, Thrombocytopenia, & Anxiety presented the second time in one week with CP, having been evaluated overnight 12/23/15 to 12/24/15 with an unremarkable EKG & negative cardiac enzymes.  # Chest pain - Typical angina, worsened with exertion, also occuring at rest, concerning for unstable angina.  He stated that he is very adherent to his medications, especially his Clopidogrel.   - He is at high risk for progressive or re-occurrent disease, so an  invasive approach is likely warranted. - We agree  with monitoring serial cardiac enzymes.   - In the meantime, we agree with the plan to obtain a resting echocardiogram. - He does not need anticoagulation unless his troponin becomes positive / trends up. - We agree with continuation of his home ASA, Clopidogrel, & Metoprolol.   - NSAID use is not ideal for his situation. He should be advised of this.    # h/o HTN - Normotensive here. - We agree with his current Amlodipine.    # h/o HLD - It is unfortunate that he is unable to tolerate statin or fibrate therapy as he would likely benefit from this.  - Niacin could be considered, though side effects are often intolerable.   - We could consider repeating a lipid panel Sunday morning if he will be NPO for a cardiac catheterization.    # h/o OSA - We agree with continuation of his home CPAP.    Lindell Spar,  12/29/2015, 6:25 AM

## 2015-12-29 NOTE — Progress Notes (Signed)
Echocardiogram 2D Echocardiogram has been performed.  [name] [name] 12/29/2015, 2:29 PM

## 2015-12-30 ENCOUNTER — Encounter (HOSPITAL_COMMUNITY)
Admission: EM | Disposition: A | Discharge status: Home / Self Care | Payer: Self-pay | Source: Home / Self Care | Attending: Emergency Medicine

## 2015-12-30 ENCOUNTER — Encounter (HOSPITAL_COMMUNITY): Payer: Self-pay | Admitting: Cardiovascular Disease

## 2015-12-30 DIAGNOSIS — I25110 Atherosclerotic heart disease of native coronary artery with unstable angina pectoris: Secondary | ICD-10-CM | POA: Diagnosis not present

## 2015-12-30 DIAGNOSIS — R072 Precordial pain: Secondary | ICD-10-CM

## 2015-12-30 HISTORY — PX: CARDIAC CATHETERIZATION: SHX172

## 2015-12-30 LAB — HEMOGLOBIN A1C
Hgb A1c MFr Bld: 7.9 % — ABNORMAL HIGH (ref 4.8–5.6)
Mean Plasma Glucose: 180 mg/dL

## 2015-12-30 LAB — CBC
HCT: 41.1 % (ref 39.0–52.0)
Hemoglobin: 14.6 g/dL (ref 13.0–17.0)
MCH: 32.1 pg (ref 26.0–34.0)
MCHC: 35.5 g/dL (ref 30.0–36.0)
MCV: 90.3 fL (ref 78.0–100.0)
Platelets: 94 10*3/uL — ABNORMAL LOW (ref 150–400)
RBC: 4.55 MIL/uL (ref 4.22–5.81)
RDW: 12.7 % (ref 11.5–15.5)
WBC: 3.1 10*3/uL — ABNORMAL LOW (ref 4.0–10.5)

## 2015-12-30 LAB — PROTIME-INR
INR: 1.2 (ref 0.00–1.49)
Prothrombin Time: 15.4 seconds — ABNORMAL HIGH (ref 11.6–15.2)

## 2015-12-30 LAB — BASIC METABOLIC PANEL
Anion gap: 9 (ref 5–15)
BUN: 15 mg/dL (ref 6–20)
CO2: 25 mmol/L (ref 22–32)
Calcium: 9.1 mg/dL (ref 8.9–10.3)
Chloride: 106 mmol/L (ref 101–111)
Creatinine, Ser: 1.07 mg/dL (ref 0.61–1.24)
GFR calc Af Amer: >60 mL/min (ref >60)
GFR calc non Af Amer: >60 mL/min (ref >60)
Glucose, Bld: 142 mg/dL — ABNORMAL HIGH (ref 65–99)
Potassium: 4.2 mmol/L (ref 3.5–5.1)
Sodium: 140 mmol/L (ref 135–145)

## 2015-12-30 LAB — GLUCOSE, CAPILLARY
GLUCOSE-CAPILLARY: 136 mg/dL — AB (ref 65–99)
GLUCOSE-CAPILLARY: 139 mg/dL — AB (ref 65–99)
GLUCOSE-CAPILLARY: 212 mg/dL — AB (ref 65–99)

## 2015-12-30 SURGERY — LEFT HEART CATH AND CORONARY ANGIOGRAPHY
Anesthesia: LOCAL

## 2015-12-30 MED ORDER — AMLODIPINE BESYLATE 2.5 MG PO TABS
2.5000 mg | ORAL_TABLET | Freq: Two times a day (BID) | ORAL | Status: DC
  Administered 2015-12-30 – 2016-01-02 (×6): 2.5 mg via ORAL
  Filled 2015-12-30 (×6): qty 1

## 2015-12-30 MED ORDER — RANOLAZINE ER 500 MG PO TB12: 500.0000 mg | ORAL_TABLET | Freq: Two times a day (BID) | ORAL | Status: DC

## 2015-12-30 MED ORDER — LIDOCAINE HCL (PF) 1 % IJ SOLN
INTRAMUSCULAR | Status: DC | PRN
  Administered 2015-12-30: 2 mL via INTRADERMAL

## 2015-12-30 MED ORDER — MIDAZOLAM HCL 2 MG/2ML IJ SOLN
INTRAMUSCULAR | Status: AC
  Filled 2015-12-30: qty 2

## 2015-12-30 MED ORDER — SODIUM CHLORIDE 0.9 % WEIGHT BASED INFUSION: 3.0000 mL/kg/h | INTRAVENOUS | Status: AC

## 2015-12-30 MED ORDER — ONDANSETRON HCL 4 MG/2ML IJ SOLN: 4.0000 mg | Freq: Four times a day (QID) | INTRAMUSCULAR | Status: DC | PRN

## 2015-12-30 MED ORDER — METOCLOPRAMIDE HCL 5 MG/ML IJ SOLN
10.0000 mg | Freq: Once | INTRAMUSCULAR | Status: AC
  Administered 2015-12-30: 10 mg via INTRAVENOUS
  Filled 2015-12-30: qty 2

## 2015-12-30 MED ORDER — HEPARIN SODIUM (PORCINE) 1000 UNIT/ML IJ SOLN
INTRAMUSCULAR | Status: AC
  Filled 2015-12-30: qty 1

## 2015-12-30 MED ORDER — SODIUM CHLORIDE 0.9 % IV SOLN: 250.0000 mL | INTRAVENOUS | Status: DC | PRN

## 2015-12-30 MED ORDER — HEPARIN (PORCINE) IN NACL 2-0.9 UNIT/ML-% IJ SOLN
INTRAMUSCULAR | Status: AC
  Filled 2015-12-30: qty 1000

## 2015-12-30 MED ORDER — SODIUM CHLORIDE 0.9% FLUSH: 3.0000 mL | INTRAVENOUS | Status: DC | PRN

## 2015-12-30 MED ORDER — FENTANYL CITRATE (PF) 100 MCG/2ML IJ SOLN
INTRAMUSCULAR | Status: AC
  Filled 2015-12-30: qty 2

## 2015-12-30 MED ORDER — LIDOCAINE HCL (PF) 1 % IJ SOLN
INTRAMUSCULAR | Status: AC
  Filled 2015-12-30: qty 30

## 2015-12-30 MED ORDER — DIPHENHYDRAMINE HCL 50 MG/ML IJ SOLN
25.0000 mg | Freq: Once | INTRAMUSCULAR | Status: AC
  Administered 2015-12-30: 25 mg via INTRAVENOUS
  Filled 2015-12-30: qty 1

## 2015-12-30 MED ORDER — ASPIRIN 81 MG PO CHEW
81.0000 mg | CHEWABLE_TABLET | Freq: Every day | ORAL | Status: DC
  Administered 2015-12-31 – 2016-01-02 (×3): 81 mg via ORAL
  Filled 2015-12-30 (×3): qty 1

## 2015-12-30 MED ORDER — NITROGLYCERIN 1 MG/10 ML FOR IR/CATH LAB
INTRA_ARTERIAL | Status: AC
  Filled 2015-12-30: qty 10

## 2015-12-30 MED ORDER — MORPHINE SULFATE (PF) 2 MG/ML IV SOLN
2.0000 mg | INTRAVENOUS | Status: DC | PRN
  Administered 2015-12-30 – 2016-01-01 (×3): 2 mg via INTRAVENOUS
  Filled 2015-12-30 (×3): qty 1

## 2015-12-30 MED ORDER — NITROGLYCERIN 0.2 MG/HR TD PT24
0.2000 mg | MEDICATED_PATCH | Freq: Every day | TRANSDERMAL | Status: DC
  Administered 2015-12-30 – 2016-01-02 (×4): 0.2 mg via TRANSDERMAL
  Filled 2015-12-30 (×4): qty 1

## 2015-12-30 MED ORDER — IOHEXOL 350 MG/ML SOLN
INTRAVENOUS | Status: DC | PRN
  Administered 2015-12-30: 75 mL via INTRA_ARTERIAL

## 2015-12-30 MED ORDER — CLOPIDOGREL BISULFATE 75 MG PO TABS: 75.0000 mg | ORAL_TABLET | Freq: Every day | ORAL | Status: DC

## 2015-12-30 MED ORDER — SODIUM CHLORIDE 0.9% FLUSH
3.0000 mL | Freq: Two times a day (BID) | INTRAVENOUS | Status: DC
Start: 2015-12-30 — End: 2016-01-02
  Administered 2015-12-31 – 2016-01-02 (×4): 3 mL via INTRAVENOUS

## 2015-12-30 MED ORDER — VERAPAMIL HCL 2.5 MG/ML IV SOLN
INTRA_ARTERIAL | Status: DC | PRN
  Administered 2015-12-30: 2 mL via INTRA_ARTERIAL

## 2015-12-30 MED ORDER — HEPARIN SODIUM (PORCINE) 1000 UNIT/ML IJ SOLN
INTRAMUSCULAR | Status: DC | PRN
  Administered 2015-12-30: 5000 [IU] via INTRAVENOUS

## 2015-12-30 MED ORDER — FENTANYL CITRATE (PF) 100 MCG/2ML IJ SOLN
INTRAMUSCULAR | Status: DC | PRN
  Administered 2015-12-30: 25 ug via INTRAVENOUS

## 2015-12-30 MED ORDER — MIDAZOLAM HCL 2 MG/2ML IJ SOLN
INTRAMUSCULAR | Status: DC | PRN
  Administered 2015-12-30: 1 mg via INTRAVENOUS

## 2015-12-30 MED ORDER — KETOROLAC TROMETHAMINE 30 MG/ML IJ SOLN
30.0000 mg | Freq: Once | INTRAMUSCULAR | Status: AC
  Administered 2015-12-30: 30 mg via INTRAVENOUS
  Filled 2015-12-30: qty 1

## 2015-12-30 MED ORDER — METFORMIN HCL 1000 MG PO TABS: 1000.0000 mg | ORAL_TABLET | Freq: Two times a day (BID) | ORAL | Status: DC

## 2015-12-30 MED ORDER — HEPARIN (PORCINE) IN NACL 2-0.9 UNIT/ML-% IJ SOLN
INTRAMUSCULAR | Status: DC | PRN
  Administered 2015-12-30: 1000 mL via INTRA_ARTERIAL

## 2015-12-30 MED ORDER — VERAPAMIL HCL 2.5 MG/ML IV SOLN
INTRAVENOUS | Status: AC
  Filled 2015-12-30: qty 2

## 2015-12-30 MED ORDER — ACETAMINOPHEN 325 MG PO TABS: 650.0000 mg | ORAL_TABLET | ORAL | Status: DC | PRN

## 2015-12-30 SURGICAL SUPPLY — 11 items
CATH INFINITI 5FR ANG PIGTAIL (CATHETERS) ×1 IMPLANT
CATH OPTITORQUE TIG 4.0 5F (CATHETERS) ×1 IMPLANT
DEVICE RAD COMP TR BAND LRG (VASCULAR PRODUCTS) ×2 IMPLANT
GLIDESHEATH SLEND A-KIT 6F 22G (SHEATH) ×1 IMPLANT
KIT HEART LEFT (KITS) ×2 IMPLANT
PACK CARDIAC CATHETERIZATION (CUSTOM PROCEDURE TRAY) ×2 IMPLANT
SYR MEDRAD MARK V 150ML (SYRINGE) ×2 IMPLANT
TRANSDUCER W/STOPCOCK (MISCELLANEOUS) ×2 IMPLANT
TUBING CIL FLEX 10 FLL-RA (TUBING) ×2 IMPLANT
WIRE HI TORQ VERSACORE-J 145CM (WIRE) ×1 IMPLANT
WIRE SAFE-T 1.5MM-J .035X260CM (WIRE) ×1 IMPLANT

## 2015-12-30 NOTE — Care Management Obs Status (Signed)
[facility] NOTIFICATION   Patient Details  Name: [name] [name] MRN: [id number] Date of Birth: [birth year]   Medicare Observation Status Notification Given:  Yes (CP- Cardiac Enzymes negative. Cath 12-30-15)    Bethena Roys, RN 12/30/2015, 11:26 AM

## 2015-12-30 NOTE — Progress Notes (Signed)
Called to see pt and family by RN. Pt's wife concerned that he hasn't been out of bed yet. His job requires him to walk a lot and limiting chest pain is what prompted this evaluation. He has significant CAD at cath, plan is for medical therapy. Unfortunately he can't tolerate Imdur or Ranexa. He is currently on ASA Plavix and low dose Norvasc. He has had low B/P in past but he looks like he could tolerate more. I have increased his Norvasc to 2.5 mg BID and will try low dose transdermal NTG patch. Discussed with Dr Gwenlyn Found. If he fails medical treatment consider surgical opinion. Hold discharge.   Kerin Ransom PA-C 12/30/2015 2:49 PM

## 2015-12-30 NOTE — Progress Notes (Signed)
Patient refuses to wear CPAP but knows to have RN contact RT if he changes his mind.

## 2015-12-30 NOTE — Progress Notes (Signed)
The patient is scheduled for cardiac catheterization today. Echocardiogram yesterday showed good LV function.  Daryel November, MD

## 2015-12-30 NOTE — H&P (View-Only) (Signed)
Primary cardiologist:  Subjective:   Mild CP overnight   Objective:   Temp:  [97.7 F (36.5 C)-98.7 F (37.1 C)] 98 F (36.7 C) (02/19 0750) Pulse Rate:  [65-80] 74 (02/19 1025) Resp:  [11-21] 20 (02/19 0600) BP: (122-151)/(67-86) 144/83 mmHg (02/19 1025) SpO2:  [92 %-99 %] 94 % (02/19 0600) Weight:  [228 lb 6.4 oz (103.602 kg)-230 lb 6.4 oz (104.509 kg)] 230 lb 6.4 oz (104.509 kg) (02/19 0600) Last BM Date: 12/22/15  Filed Weights   12/28/15 2300 12/29/15 0600  Weight: 228 lb 6.4 oz (103.602 kg) 230 lb 6.4 oz (104.509 kg)    Intake/Output Summary (Last 24 hours) at 12/29/15 1058 Last data filed at 12/29/15 0000  Gross per 24 hour  Intake    240 ml  Output    350 ml  Net   -110 ml     Exam:  General: NAD  HEENT: sclera clear, throat clear  Resp: CTAB  Cardiac: RRR, no m/r/g, no jvd  GI: abdomen soft, NT, ND  MSK: no LE edema  Neuro: no focal deficits  Psych: appropriate affect  Lab Results:  Basic Metabolic Panel:  Recent Labs Lab 12/23/15 1935 12/28/15 1943  NA 140 139  K 4.1 4.5  CL 105 106  CO2 27 22  GLUCOSE 251* 235*  BUN 19 21*  CREATININE 0.85 1.11  CALCIUM 9.0 9.4    Liver Function Tests:  Recent Labs Lab 12/28/15 1943  AST 33  ALT 43  ALKPHOS 40  BILITOT 0.7  PROT 6.5  ALBUMIN 3.5    CBC:  Recent Labs Lab 12/23/15 1935 12/28/15 1943  WBC 3.2* 3.5*  HGB 14.9 14.3  HCT 41.4 41.1  MCV 89.8 90.1  PLT 107* 106*    Cardiac Enzymes:  Recent Labs Lab 12/28/15 2320 12/29/15 0132 12/29/15 0458  TROPONINI <0.03 <0.03 <0.03    BNP: No results for input(s): PROBNP in the last 8760 hours.  Coagulation: No results for input(s): INR in the last 168 hours.  ECG:   Medications:   Scheduled Medications: . amLODipine  2.5 mg Oral Daily  . aspirin  324 mg Oral Once  . aspirin EC  81 mg Oral QHS  . citalopram  20 mg Oral QHS  . clopidogrel  75 mg Oral Daily  . enoxaparin (LOVENOX) injection  40 mg  Subcutaneous Q24H  . gabapentin  400 mg Oral TID  . insulin aspart  0-15 Units Subcutaneous TID WC  . insulin aspart  0-5 Units Subcutaneous QHS  . insulin glargine  30 Units Subcutaneous BID  . metoprolol tartrate  50 mg Oral BID  . mometasone-formoterol  2 puff Inhalation BID  . pantoprazole  40 mg Oral Daily  . ramipril  5 mg Oral QHS  . terbinafine  250 mg Oral QHS     Infusions:     PRN Medications:  acetaminophen, gi cocktail, morphine injection, ondansetron (ZOFRAN) IV     Assessment/Plan    1. Chest pain/CAD - history of CAD with prior stenting - 11/2013 Nuclear stress negative for ischemia - 11/2014 cath with patent stents with distal LAD 50% - symptoms worrisome for progressing angina and DOE, second admit in last 2 weeks with similar symptoms. Has not had evidence of ACS by EKG or enzymes. Better with prn NG.  - medical therapy with ASA, plavix, lopressor, ramipril. He is not tolerant to statins  - given his history, fairly typical symptoms of chest pain and DOE that  are significantly progressing we will plan for cath tomorrow to best evaluate his coronaries. Pretest probability too high for noninvasive testing.        Carlyle Dolly, M.D.

## 2015-12-30 NOTE — Interval H&P Note (Signed)
Cath Lab Visit (complete for each Cath Lab visit)  Clinical Evaluation Leading to the Procedure:   ACS: Yes.    Non-ACS:    Anginal Classification: CCS IV  Anti-ischemic medical therapy: Maximal Therapy (2 or more classes of medications)  Non-Invasive Test Results: No non-invasive testing performed  Prior CABG: No previous CABG      History and Physical Interval Note:  12/30/2015 12:13 PM  [name] [name]  has presented today for surgery, with the diagnosis of unstable angina  The various methods of treatment have been discussed with the patient and family. After consideration of risks, benefits and other options for treatment, the patient has consented to  Procedure(s): Left Heart Cath and Coronary Angiography (N/A) as a surgical intervention .  The patient's history has been reviewed, patient examined, no change in status, stable for surgery.  I have reviewed the patient's chart and labs.  Questions were answered to the patient's satisfaction.     Quay Burow

## 2015-12-30 NOTE — Discharge Summary (Deleted)
[name] [name], is a 60 y.o. male  DOB 12/04/1955  MRN [id number].  Admission date:  12/28/2015  Admitting Physician  Edwin Dada, MD  Discharge Date:  12/30/2015   Primary MD  Deloria Lair, MD  Recommendations for primary care physician for things to follow:   Monitor secondary to his factors for CAD closely. Close outpatient cardiology follow-up   Admission Diagnosis  Chest pain, unspecified chest pain type [R07.9]   Discharge Diagnosis  Chest pain, unspecified chest pain type [R07.9]     Principal Problem:   Chest pain Active Problems:   Essential hypertension, benign   Obstructive sleep apnea   Type 2 diabetes mellitus ([place])   Coronary atherosclerosis of native coronary artery   Hyperlipidemia      Past Medical History  Diagnosis Date  . Coronary atherosclerosis of native coronary artery     a. LAD & OM stenting;  b. 2007 Cath: nonobs dzs, patent stents;  c. 07/2012 neg MV, EF 61%.  d. 11/20/14: [place] s/p  PCI w/ DES to pLAD and DES to OM1   . Asthma   . Essential hypertension, benign   . TIA (transient ischemic attack) ~ 2010  . Anxiety   . Hypercholesteremia   . GERD (gastroesophageal reflux disease)   . OSA on CPAP   . Type 2 diabetes mellitus ([place])   . History of hiatal hernia   . Migraine   . Arthritis   . Lumbar herniated disc   . Chronic lower back pain   . History of gout   . Thrombocytopenia ([place])   . MI, old     Past Surgical History  Procedure Laterality Date  . Anterior cervical decomp/discectomy fusion  1998    "C3-4"  . Carpal tunnel release Bilateral 2005  . Shoulder arthroscopy Left ~ 2011  . Knee surgery Left 02/2012    "scraped; open"  . Neuroplasty / transposition ulnar nerve at elbow Right ~ 2012  . Left heart catheterization with coronary angiogram N/A  07/20/2012    Procedure: LEFT HEART CATHETERIZATION WITH CORONARY ANGIOGRAM;  Surgeon: [name], MD;  Location: [facility] CATH LAB;  Service: Cardiovascular;  Laterality: N/A;  . Coronary angioplasty with stent placement  2002; 2003; 11/20/2014    "I have 4 stents after today" (11/20/2014)  . Cardiac catheterization  "several"  . Coronary angioplasty    . Left heart catheterization with coronary angiogram N/A 11/20/2014    Procedure: LEFT HEART CATHETERIZATION WITH CORONARY ANGIOGRAM;  Surgeon: [name], MD;  Location: [facility] CATH LAB;  Service: Cardiovascular;  Laterality: N/A;  . Percutaneous coronary rotoblator intervention (pci-r)  11/20/2014    Procedure: PERCUTANEOUS CORONARY ROTOBLATOR INTERVENTION (PCI-R);  Surgeon: [name], MD;  Location: [facility] CATH LAB;  Service: Cardiovascular;;  . Left heart catheterization with coronary angiogram N/A 11/26/2014    Procedure: LEFT HEART CATHETERIZATION WITH CORONARY ANGIOGRAM;  Surgeon: [name], MD;  Location: [facility] C[facility] LAB;  Service: Cardiovascular;  Laterality: N/A;  . Cardiac catheterization  N/A 12/30/2015    Procedure: Left Heart Cath and Coronary Angiography;  Surgeon: [name], MD;  Location: [place] CV LAB;  Service: Cardiovascular;  Laterality: N/A;       HPI  from the history and physical done on the day of admission:    [name] [name] is a 60 y.o. male with a past medical history significant for CAD s/p who presents with chest pain.  The patient's complaint started with dyspnea on exertion beginning about one week ago, associated with chest discomfort. The discomfort is located in the center chest, is vague in character, and is moderate in intensity. He was admitted for observation overnight several days ago, had normal ECG, serial troponins, and was asked to see his Cardiologist in follow up, which is not scheduled until Mar 10.   In the meantime, the dyspnea and chest discomfort continue and were worse today.  Tonight, he went in to work at [employer], and appeared pale, clammy, "blotchy", and diaphoretic to wife and co-workers. He had to rest on a cart at work, and was only able to walk a quarter of the way around the store before he had to stop and rest, so he was sent to the ER.  In the ED, the patient's initial ECG showed NSR without ST changes and troponin was negative. TRH was asked to admit for observation, serial troponins and risk stratification.  He denied fever, cough, wheezing. He has had no leg swelling, orthopnea, or PND.      Hospital Course:    1. Chest Pain in a patient with history of CAD with stent placement in 2015 - appeared to be classic/typical in terms of symptoms, ruled out for MI, stable d-dimer, EKG was nonacute, seen by cardiology, echo was unremarkable with a preserved EF of 60% and no wall motion abnormality, underwent left heart cath on 12/30/2015 by Dr. Inez Catalina showing patent stent, confirmed presence of nonocclusive CAD, current recommendations are to continue medications for secondary risk factor modulation, he is currently on aspirin, Plavix and  beta blocker which will be continued, he is allergic to Ranexa, TriCor and statins. He is currently chest pain and symptom free.   Case discussed with Dr. Albertha Ghee, observe for 24 hours then be discharged home on 12/31/2015 and will follow with PCP along with primary care physician closely post discharge. If he remains symptom-free and might have to hold discharge and call cardiothoracic surgery.   2. Essential hypertension. Blood pressure stable on combination of Norvasc, ACE inhibitor, resume home dose beta blocker.  3. GERD. On PPI continue.  4. Diabetic neuropathy. On Neurontin home dose. Will be continued unchanged.   5. DM type II. Commence home regimen postdischarge, hold Glucophage for 2 days due to IV dye exposure.  Lab Results  Component Value Date   HGBA1C 7.9* 12/29/2015   CBG (last 3)   Recent Labs   12/29/15 2102 12/30/15 0751 12/30/15 1132  GLUCAP 263* 136* 139*     Discharge Condition: Fair  Follow UP  Follow-up Information    Follow up with TAPPER,DAVID B, MD. Schedule an appointment as soon as possible for a visit in 1 week.   Specialty:  Family Medicine   Contact information:   [place] [address] [phone]       Follow up with Quay Burow, MD. Schedule an appointment as soon as possible for a visit in 1 week.   Specialties:  Cardiology, Radiology   Contact  information:   [address] [place] [address] [phone]        Consults obtained - Cards  Diet and Activity recommendation: See Discharge Instructions below  Discharge Instructions           Discharge Instructions    Discharge instructions    Complete by:  As directed   Follow with Primary MD TAPPER,DAVID B, MD in 7 days   Get CBC, CMP, 2 view Chest X ray checked  by Primary MD next visit.    Activity: As tolerated with Full fall precautions use walker/cane & assistance as needed   Disposition Home    Diet:   Heart Healthy Low Carb  .  Accuchecks 4 times/day, Once in AM empty stomach and then before each meal. Log in all results and show them to your Prim.MD in 3 days. If any glucose reading is under 80 or above 300 call your Prim MD immidiately. Follow Low glucose instructions for glucose under 80 as instructed.  For Heart failure patients - Check your Weight same time everyday, if you gain over 2 pounds, or you develop in leg swelling, experience more shortness of breath or chest pain, call your Primary MD immediately. Follow Cardiac Low Salt Diet and 1.5 lit/day fluid restriction.   On your next visit with your primary care physician please Get Medicines reviewed and adjusted.   Please request your Prim.MD to go over all Hospital Tests and Procedure/Radiological results at the follow up, please get all Hospital records sent to your Prim  MD by signing hospital release before you go home.   If you experience worsening of your admission symptoms, develop shortness of breath, life threatening emergency, suicidal or homicidal thoughts you must seek medical attention immediately by calling 911 or calling your MD immediately  if symptoms less severe.  You Must read complete instructions/literature along with all the possible adverse reactions/side effects for all the Medicines you take and that have been prescribed to you. Take any new Medicines after you have completely understood and accpet all the possible adverse reactions/side effects.   Do not drive, operating heavy machinery, perform activities at heights, swimming or participation in water activities or provide baby sitting services if your were admitted for syncope or siezures until you have seen by Primary MD or a Neurologist and advised to do so again.  Do not drive when taking Pain medications.    Do not take more than prescribed Pain, Sleep and Anxiety Medications  Special Instructions: If you have smoked or chewed Tobacco  in the last 2 yrs please stop smoking, stop any regular Alcohol  and or any Recreational drug use.  Wear Seat belts while driving.   Please note  You were cared for by a hospitalist during your hospital stay. If you have any questions about your discharge medications or the care you received while you were in the hospital after you are discharged, you can call the unit and asked to speak with the hospitalist on call if the hospitalist that took care of you is not available. Once you are discharged, your primary care physician will handle any further medical issues. Please note that NO REFILLS for any discharge medications will be authorized once you are discharged, as it is imperative that you return to your primary care physician (or establish a relationship with a primary care physician if you do not have one) for your aftercare needs so that they  can reassess your need for medications  and monitor your lab values.     Increase activity slowly    Complete by:  As directed              Discharge Medications       Medication List    STOP taking these medications        ibuprofen 200 MG tablet  Commonly known as:  ADVIL,MOTRIN      TAKE these medications        acetaminophen 500 MG tablet  Commonly known as:  TYLENOL  Take 1,000 mg by mouth every 6 (six) hours as needed (pain).     albuterol 108 (90 Base) MCG/ACT inhaler  Commonly known as:  PROVENTIL HFA;VENTOLIN HFA  Inhale 2 puffs into the lungs every 6 (six) hours as needed for wheezing or shortness of breath.     albuterol (2.5 MG/3ML) 0.083% nebulizer solution  Commonly known as:  PROVENTIL  Take 2.5 mg by nebulization every 6 (six) hours as needed for wheezing or shortness of breath.     amLODipine 2.5 MG tablet  Commonly known as:  NORVASC  TAKE 1 TABLET BY MOUTH DAILY     aspirin EC 81 MG tablet  Take 81 mg by mouth at bedtime.     citalopram 20 MG tablet  Commonly known as:  CELEXA  Take 20 mg by mouth at bedtime.     clopidogrel 75 MG tablet  Commonly known as:  PLAVIX  Take 75 mg by mouth daily with breakfast.     fluticasone 50 MCG/ACT nasal spray  Commonly known as:  FLONASE  Place 1 spray into the nose 2 (two) times daily.     Fluticasone-Salmeterol 250-50 MCG/DOSE Aepb  Commonly known as:  ADVAIR  Inhale 1 puff into the lungs every 12 (twelve) hours.     gabapentin 400 MG capsule  Commonly known as:  NEURONTIN  Take 400 mg by mouth 3 (three) times daily.     LANTUS SOLOSTAR 100 UNIT/ML Solostar Pen  Generic drug:  Insulin Glargine  Inject 60 Units into the skin 2 (two) times daily.     Melatonin 5 MG Tabs  Take 10 mg by mouth at bedtime.     metFORMIN 1000 MG tablet  Commonly known as:  GLUCOPHAGE  Take 1 tablet (1,000 mg total) by mouth 2 (two) times daily.  Start taking on:  01/02/2016     metoprolol 50 MG tablet  Commonly  known as:  LOPRESSOR  Take 1 tablet (50 mg total) by mouth daily.     nitroGLYCERIN 0.4 MG SL tablet  Commonly known as:  NITROSTAT  Place 0.4 mg under the tongue every 5 (five) minutes as needed for chest pain.     NOVOLOG FLEXPEN 100 UNIT/ML FlexPen  Generic drug:  insulin aspart  Inject 1-15 Units into the skin 3 (three) times daily as needed for high blood sugar (CBG >120). Per sliding scale written down at home     omeprazole 20 MG capsule  Commonly known as:  PRILOSEC  Take 20 mg by mouth 2 (two) times daily before a meal.     ramipril 5 MG tablet  Commonly known as:  ALTACE  Take 5 mg by mouth at bedtime.     terbinafine 250 MG tablet  Commonly known as:  LAMISIL  Take 250 mg by mouth at bedtime. For toe fungus        Major procedures and Radiology Reports - PLEASE review detailed and final reports  for all details, in brief -   L Heart Cath  History obtained from chart review. [name] [name] is 60 y/o Caucasian male with a history of CAD. He had cardiac catheterization performed January 2015 with PCI and stenting of his LAD and LCX-OM branch by Dr [name]. He was admitted with Unstable angina. He presents now for cath and potential intervention.  IMPRESSION :[name] [name] has patent stent with progression of his mid-distal LAD in a 1.5-1.75mm vessel. Circumflex is unchanged from his prior angiogram. Continued medical Rx will be recommended. The sheath was removed and a TR band on the right wrist to achieve patent hemostasis.  Quay Burow. MD, [facility] 12/30/2015 1:07 PM   TTE  Left ventricle: The cavity size was normal. Wall thickness wasincreased in a pattern of mild LVH. Systolic function was normal. The estimated ejection fraction was in the range of 60% to 65%.Wall motion was normal; there were no regional wall motion abnormalities. Doppler parameters are consistent with abnormal left ventricular relaxation (grade 1 diastolic dysfunction). - Aortic valve: There was  mild to moderate regurgitation. Valvearea (VTI): 2.47 cm^2. Valve area (Vmax): 2.43 cm^2. - Left atrium: The atrium was moderately dilated. - Technically difficult study. Echocontrast was used to enhance visualization.   Dg Chest 2 View  12/23/2015  CLINICAL DATA:  60 year old male with left-sided chest pain since earlier this morning EXAM: CHEST  2 VIEW COMPARISON:  Prior chest x-ray 12/03/2014 FINDINGS: The lungs are clear and negative for focal airspace consolidation, pulmonary edema or suspicious pulmonary nodule. No pleural effusion or pneumothorax. Cardiac and mediastinal contours are within normal limits. Metallic stent projects the left heart. No acute fracture or lytic or blastic osseous lesions. The visualized upper abdominal bowel gas pattern is unremarkable. IMPRESSION: No active cardiopulmonary disease. Electronically Signed   By: Jacqulynn Cadet M.D.   On: 12/23/2015 18:46   Dg Chest Port 1 View  12/28/2015  CLINICAL DATA:  Shortness of breath with chest pressure. Lightheadedness today. EXAM: PORTABLE CHEST 1 VIEW COMPARISON:  Frontal and lateral views 12/23/2015 FINDINGS: The cardiomediastinal contours are normal. Cardiac stent in place. The lungs are clear. Pulmonary vasculature is normal. No consolidation, pleural effusion, or pneumothorax. No acute osseous abnormalities are seen. IMPRESSION: No acute pulmonary process. Electronically Signed   By: Jeb Levering M.D.   On: 12/28/2015 20:08    Micro Results      No results found for this or any previous visit (from the past 240 hour(s)).     Today   Subjective    [name] [name] today has no headache,no chest abdominal pain,no new weakness tingling or numbness, feels much better wants to go home today.     Objective   Blood pressure 139/82, pulse 67, temperature 98 F (36.7 C), temperature source Oral, resp. rate 10, height 5' 9"  (1.753 m), weight 103.919 kg (229 lb 1.6 oz), SpO2 97 %.   Intake/Output Summary  (Last 24 hours) at 12/30/15 1445 Last data filed at 12/30/15 0618  Gross per 24 hour  Intake    120 ml  Output   1950 ml  Net  -1830 ml    Exam Awake Alert, Oriented x 3, No new F.N deficits, Normal affect [place].AT,PERRAL Supple Neck,No JVD, No cervical lymphadenopathy appriciated.  Symmetrical Chest wall movement, Good air movement bilaterally, CTAB RRR,No Gallops,Rubs or new Murmurs, No Parasternal Heave +ve B.Sounds, Abd Soft, Non tender, No organomegaly appriciated, No rebound -guarding or rigidity. No Cyanosis, Clubbing or edema, No new Rash or  bruise   Data Review   CBC w Diff:  Lab Results  Component Value Date   WBC 3.1* 12/30/2015   HGB 14.6 12/30/2015   HCT 41.1 12/30/2015   PLT 94* 12/30/2015   LYMPHOPCT 35 10/29/2015   MONOPCT 6 10/29/2015   EOSPCT 3 10/29/2015   BASOPCT 0 10/29/2015    CMP:  Lab Results  Component Value Date   NA 140 12/30/2015   K 4.2 12/30/2015   CL 106 12/30/2015   CO2 25 12/30/2015   BUN 15 12/30/2015   CREATININE 1.07 12/30/2015   PROT 6.5 12/28/2015   ALBUMIN 3.5 12/28/2015   BILITOT 0.7 12/28/2015   ALKPHOS 40 12/28/2015   AST 33 12/28/2015   ALT 43 12/28/2015  .   Total Time in preparing paper work, data evaluation and todays exam - 35 minutes  Thurnell Lose M.D on 12/30/2015 at 2:45 PM  [facility]   Office  [phone]

## 2015-12-31 DIAGNOSIS — Z9861 Coronary angioplasty status: Secondary | ICD-10-CM

## 2015-12-31 DIAGNOSIS — I25110 Atherosclerotic heart disease of native coronary artery with unstable angina pectoris: Secondary | ICD-10-CM | POA: Diagnosis not present

## 2015-12-31 DIAGNOSIS — I2510 Atherosclerotic heart disease of native coronary artery without angina pectoris: Secondary | ICD-10-CM

## 2015-12-31 DIAGNOSIS — R079 Chest pain, unspecified: Secondary | ICD-10-CM | POA: Diagnosis not present

## 2015-12-31 LAB — GLUCOSE, CAPILLARY
Glucose-Capillary: 117 mg/dL — ABNORMAL HIGH (ref 65–99)
Glucose-Capillary: 199 mg/dL — ABNORMAL HIGH (ref 65–99)
Glucose-Capillary: 208 mg/dL — ABNORMAL HIGH (ref 65–99)
Glucose-Capillary: 176 mg/dL — ABNORMAL HIGH (ref 65–99)

## 2015-12-31 LAB — BASIC METABOLIC PANEL
Anion gap: 9 (ref 5–15)
BUN: 20 mg/dL (ref 6–20)
CHLORIDE: 105 mmol/L (ref 101–111)
CO2: 26 mmol/L (ref 22–32)
CREATININE: 1.24 mg/dL (ref 0.61–1.24)
Calcium: 9 mg/dL (ref 8.9–10.3)
Glucose, Bld: 124 mg/dL — ABNORMAL HIGH (ref 65–99)
POTASSIUM: 3.9 mmol/L (ref 3.5–5.1)
SODIUM: 140 mmol/L (ref 135–145)

## 2015-12-31 LAB — CBC
HCT: 40.7 % (ref 39.0–52.0)
HEMOGLOBIN: 14.5 g/dL (ref 13.0–17.0)
MCH: 32.4 pg (ref 26.0–34.0)
MCHC: 35.6 g/dL (ref 30.0–36.0)
MCV: 90.8 fL (ref 78.0–100.0)
PLATELETS: 102 10*3/uL — AB (ref 150–400)
RBC: 4.48 MIL/uL (ref 4.22–5.81)
RDW: 12.7 % (ref 11.5–15.5)
WBC: 3.8 10*3/uL — ABNORMAL LOW (ref 4.0–10.5)

## 2015-12-31 MED ORDER — IBUPROFEN 200 MG PO TABS
400.0000 mg | ORAL_TABLET | Freq: Once | ORAL | Status: AC
  Administered 2015-12-31: 400 mg via ORAL
  Filled 2015-12-31: qty 2

## 2015-12-31 NOTE — Progress Notes (Addendum)
Subjective:  No chest pain- in bed room darkened, door closed.   Objective:  Vital Signs in the last 24 hours: Temp:  [97.7 F (36.5 C)-98.4 F (36.9 C)] 97.7 F (36.5 C) (02/21 0508) Pulse Rate:  [61-136] 61 (02/21 0508) Resp:  [0-27] 16 (02/21 0508) BP: (124-158)/(62-93) 128/62 mmHg (02/21 0508) SpO2:  [0 %-100 %] 99 % (02/21 0508) Weight:  [231 lb 4.8 oz (104.917 kg)] 231 lb 4.8 oz (104.917 kg) (02/21 0508)  Intake/Output from previous day:  Intake/Output Summary (Last 24 hours) at 12/31/15 0749 Last data filed at 12/30/15 2010  Gross per 24 hour  Intake      0 ml  Output    650 ml  Net   -650 ml    Physical Exam: General appearance: alert, cooperative and no distress Lungs: clear to auscultation bilaterally Heart: regular rate and rhythm Extremities: rt radial site without hematoma   Rate: 62  Rhythm: normal sinus rhythm  Lab Results:  Recent Labs  12/30/15 0612 12/31/15 0457  WBC 3.1* 3.8*  HGB 14.6 14.5  PLT 94* 102*    Recent Labs  12/30/15 0612 12/31/15 0457  NA 140 140  K 4.2 3.9  CL 106 105  CO2 25 26  GLUCOSE 142* 124*  BUN 15 20  CREATININE 1.07 1.24    Recent Labs  12/29/15 0132 12/29/15 0458  TROPONINI <0.03 <0.03    Recent Labs  12/30/15 0612  INR 1.20    Scheduled Meds: . amLODipine  2.5 mg Oral BID  . aspirin  324 mg Oral Once  . aspirin  81 mg Oral Daily  . citalopram  20 mg Oral QHS  . clopidogrel  75 mg Oral Daily  . enoxaparin (LOVENOX) injection  40 mg Subcutaneous Q24H  . gabapentin  400 mg Oral TID  . insulin aspart  0-15 Units Subcutaneous TID WC  . insulin aspart  0-5 Units Subcutaneous QHS  . insulin glargine  30 Units Subcutaneous BID  . metoprolol tartrate  50 mg Oral BID  . mometasone-formoterol  2 puff Inhalation BID  . nitroGLYCERIN  0.2 mg Transdermal Daily  . pantoprazole  40 mg Oral Daily  . ramipril  5 mg Oral QHS  . sodium chloride flush  3 mL Intravenous Q12H  . terbinafine  250 mg Oral  QHS   Continuous Infusions:  PRN Meds:.sodium chloride, acetaminophen, gi cocktail, morphine injection, ondansetron (ZOFRAN) IV, sodium chloride flush   Imaging: Imaging results have been reviewed  Cardiac Studies: Echo 12/29/15 Study Conclusions  - Left ventricle: The cavity size was normal. Wall thickness was increased in a pattern of mild LVH. Systolic function was normal. The estimated ejection fraction was in the range of 60% to 65%. Wall motion was normal; there were no regional wall motion abnormalities. Doppler parameters are consistent with abnormal left ventricular relaxation (grade 1 diastolic dysfunction). - Aortic valve: There was mild to moderate regurgitation. Valve area (VTI): 2.47 cm^2. Valve area (Vmax): 2.43 cm^2. - Left atrium: The atrium was moderately dilated. - Technically difficult study. Echocontrast was used to enhance visualization.    Assessment/Plan:  60 yo M pt of Dr. Domenic Polite [facility] office) w ah/o CAD, DM, HTN, HLD, TIA (2010), OSA on CPAP, Gout, Thrombocytopenia, & Anxiety presented the second time in one week with CP. He is a Furniture conservator/restorer at [employer] and can't walk the length of the store without chest pain. Cath done 2/20- moderate to severe diffuse multivessel disease. Plan  was for medical Rx but the pt has been intolerant to nitrates/Ranexa.   Principal Problem:   Chest pain with high risk of acute coronary syndrome Active Problems:   CAD- diffuse LAD disease at cath 12/30/15   CAD -S/P PCI LAD OM/LAD '13 and 2016   Essential hypertension, benign   Type 2 diabetes mellitus ([place])   Obstructive sleep apnea-declines C-pap   TIA (transient ischemic attack)   Hyperlipidemia   PLAN: He seems to be tolerating Nitro Dur patch- no headache. He has still not been out of bed. Saline lock IV and ambulate. If he has exertional chest pain consider CVTS consult per Dr Gwenlyn Found.   Kerin Ransom PA-C 12/31/2015, 7:49 AM [phone]  Patient seen  and examined. I agree with the assessment and plan as detailed above. See also my additional thoughts below.   The plan will be to ambulate the patient today. We will see how he does with ambulation. I do feel it is prudent to go ahead and ask for consultation from cardiac surgery while the patient is here. I spoke with the patient and his wife at length in the room. The patient had some slight chest discomfort when walking in the hall today. He and his wife are anxious about the overall situation. I did tell him that we would request surgical consultation today. We will also see how he does with further ambulation.  Dola Argyle, MD, [facility] 12/31/2015 8:40 AM

## 2015-12-31 NOTE — Progress Notes (Signed)
Patient Demographics:    [name] [name], is a 60 y.o. male, DOB - [birth year], YYT:[id number]  Admit date - 12/28/2015   Admitting Physician Edwin Dada, MD  Outpatient Primary MD for the patient is Deloria Lair, MD  LOS -    Chief Complaint  Patient presents with  . Chest Pain        Subjective:    [name] today has, No headache, No chest pain, No abdominal pain - No Nausea, No new weakness tingling or numbness, No Cough - SOB.     Assessment  & Plan :     1. Chest Pain in a patient with history of CAD - appears to be classic/typical in terms of symptoms, ruled out for MI, stable d-dimer, EKG was nonacute, seen by cardiology, currently on aspirin, Plavix and home beta blocker, echogram was stable with preserved EF, left heart cath showed multivessel disease but with patent stent, still has exertional symptoms, multiple drug allergies which include statin, TriCor, Ranexa and Imdur. Per cardiology cardio thoracic surgery to be consulted for a possible bypass.  2. Essential hypertension. Blood pressure stable on combination of Norvasc, ACE inhibitor, resume home dose beta blocker.  3. GERD. On PPI continue.  4. Diabetic neuropathy. On Neurontin home dose. Will be continued unchanged.   5. DM type II. On Lantus and sliding scale continue to monitor.  Lab Results  Component Value Date   HGBA1C 7.9* 12/29/2015    CBG (last 3)   Recent Labs  12/30/15 1132 12/30/15 1652 12/31/15 0759  GLUCAP 139* 212* 117*      Code Status : Full  Family Communication  : None Present  Disposition Plan  :  Home likely post Cath  Consults  :  Cards, Cardio Thoracic Surg  Procedures  :   L Heart Cath  History obtained from chart review. [name] [name] is 60 y/o Caucasian male with a  history of CAD. He had cardiac catheterization performed January 2015 with PCI and stenting of his LAD and LCX-OM branch by Dr [name]. He was admitted with Unstable angina. He presents now for cath and potential intervention.  IMPRESSION :[name] [name] has patent stent with progression of his mid-distal LAD in a 1.5-1.75mm vessel. Circumflex is unchanged from his prior angiogram. Continued medical Rx will be recommended. The sheath was removed and a TR band on the right wrist to achieve patent hemostasis.  Quay Burow. MD, [facility] 12/30/2015 1:07 PM   TTE  Left ventricle: The cavity size was normal. Wall thickness wasincreased in a pattern of mild LVH. Systolic function was normal. The estimated ejection fraction was in the range of 60% to 65%.Wall motion was normal; there were no regional wall motion abnormalities. Doppler parameters are consistent with abnormal left ventricular relaxation (grade 1 diastolic dysfunction). - Aortic valve: There was mild to moderate regurgitation. Valvearea (VTI): 2.47 cm^2. Valve area (Vmax): 2.43 cm^2. - Left atrium: The atrium was moderately dilated. - Technically difficult study. Echocontrast was used to enhance visualization.  DVT Prophylaxis  :  Lovenox   Lab Results  Component Value Date   PLT 102* 12/31/2015    Inpatient Medications  Scheduled Meds: . amLODipine  2.5 mg Oral BID  . aspirin  324 mg  Oral Once  . aspirin  81 mg Oral Daily  . citalopram  20 mg Oral QHS  . clopidogrel  75 mg Oral Daily  . enoxaparin (LOVENOX) injection  40 mg Subcutaneous Q24H  . gabapentin  400 mg Oral TID  . insulin aspart  0-15 Units Subcutaneous TID WC  . insulin aspart  0-5 Units Subcutaneous QHS  . insulin glargine  30 Units Subcutaneous BID  . metoprolol tartrate  50 mg Oral BID  . mometasone-formoterol  2 puff Inhalation BID  . nitroGLYCERIN  0.2 mg Transdermal Daily  . pantoprazole  40 mg Oral Daily  . ramipril  5 mg Oral QHS  . sodium chloride  flush  3 mL Intravenous Q12H  . terbinafine  250 mg Oral QHS   Continuous Infusions:  PRN Meds:.sodium chloride, acetaminophen, gi cocktail, morphine injection, ondansetron (ZOFRAN) IV, sodium chloride flush  Antibiotics  :     Anti-infectives    Start     Dose/Rate Route Frequency Ordered Stop   12/28/15 2315  terbinafine (LAMISIL) tablet 250 mg     250 mg Oral Daily at bedtime 12/28/15 2306          Objective:   Filed Vitals:   12/30/15 1955 12/30/15 2000 12/31/15 0508 12/31/15 0745  BP:  158/93 128/62   Pulse:   61 69  Temp:  98.4 F (36.9 C) 97.7 F (36.5 C)   TempSrc:  Oral Oral   Resp:  12 16 16   Height:      Weight:   104.917 kg (231 lb 4.8 oz)   SpO2: 96%  99% 98%    Wt Readings from Last 3 Encounters:  12/31/15 104.917 kg (231 lb 4.8 oz)  12/23/15 103.783 kg (228 lb 12.8 oz)  10/29/15 106.595 kg (235 lb)     Intake/Output Summary (Last 24 hours) at 12/31/15 0943 Last data filed at 12/30/15 2010  Gross per 24 hour  Intake      0 ml  Output    650 ml  Net   -650 ml     Physical Exam  Awake Alert, Oriented X 3, No new F.N deficits, Normal affect [place].AT,PERRAL Supple Neck,No JVD, No cervical lymphadenopathy appriciated.  Symmetrical Chest wall movement, Good air movement bilaterally, CTAB RRR,No Gallops,Rubs or new Murmurs, No Parasternal Heave +ve B.Sounds, Abd Soft, No tenderness, No organomegaly appriciated, No rebound - guarding or rigidity. No Cyanosis, Clubbing or edema, No new Rash or bruise     Data Review:   Micro Results No results found for this or any previous visit (from the past 240 hour(s)).  Radiology Reports Dg Chest 2 View  12/23/2015  CLINICAL DATA:  61 year old male with left-sided chest pain since earlier this morning EXAM: CHEST  2 VIEW COMPARISON:  Prior chest x-ray 12/03/2014 FINDINGS: The lungs are clear and negative for focal airspace consolidation, pulmonary edema or suspicious pulmonary nodule. No pleural effusion or  pneumothorax. Cardiac and mediastinal contours are within normal limits. Metallic stent projects the left heart. No acute fracture or lytic or blastic osseous lesions. The visualized upper abdominal bowel gas pattern is unremarkable. IMPRESSION: No active cardiopulmonary disease. Electronically Signed   By: Jacqulynn Cadet M.D.   On: 12/23/2015 18:46   Dg Chest Port 1 View  12/28/2015  CLINICAL DATA:  Shortness of breath with chest pressure. Lightheadedness today. EXAM: PORTABLE CHEST 1 VIEW COMPARISON:  Frontal and lateral views 12/23/2015 FINDINGS: The cardiomediastinal contours are normal. Cardiac stent in place. The lungs  are clear. Pulmonary vasculature is normal. No consolidation, pleural effusion, or pneumothorax. No acute osseous abnormalities are seen. IMPRESSION: No acute pulmonary process. Electronically Signed   By: Jeb Levering M.D.   On: 12/28/2015 20:08     CBC  Recent Labs Lab 12/28/15 1943 12/30/15 0612 12/31/15 0457  WBC 3.5* 3.1* 3.8*  HGB 14.3 14.6 14.5  HCT 41.1 41.1 40.7  PLT 106* 94* 102*  MCV 90.1 90.3 90.8  MCH 31.4 32.1 32.4  MCHC 34.8 35.5 35.6  RDW 12.9 12.7 12.7    Chemistries   Recent Labs Lab 12/28/15 1943 12/30/15 0612 12/31/15 0457  NA 139 140 140  K 4.5 4.2 3.9  CL 106 106 105  CO2 22 25 26   GLUCOSE 235* 142* 124*  BUN 21* 15 20  CREATININE 1.11 1.07 1.24  CALCIUM 9.4 9.1 9.0  AST 33  --   --   ALT 43  --   --   ALKPHOS 40  --   --   BILITOT 0.7  --   --    ------------------------------------------------------------------------------------------------------------------ No results for input(s): CHOL, HDL, LDLCALC, TRIG, CHOLHDL, LDLDIRECT in the last 72 hours.  Lab Results  Component Value Date   HGBA1C 7.9* 12/29/2015   ------------------------------------------------------------------------------------------------------------------ No results for input(s): TSH, T4TOTAL, T3FREE, THYROIDAB in the last 72 hours.  Invalid  input(s): FREET3 ------------------------------------------------------------------------------------------------------------------ No results for input(s): VITAMINB12, FOLATE, FERRITIN, TIBC, IRON, RETICCTPCT in the last 72 hours.  Coagulation profile  Recent Labs Lab 12/30/15 0612  INR 1.20     Recent Labs  12/28/15 2300  DDIMER <0.27    Cardiac Enzymes  Recent Labs Lab 12/28/15 2320 12/29/15 0132 12/29/15 0458  TROPONINI <0.03 <0.03 <0.03   ------------------------------------------------------------------------------------------------------------------    Component Value Date/Time   BNP 10.6 12/03/2014 1640    Time Spent in minutes  35   Ziare Cryder K M.D on 12/31/2015 at 9:43 AM  Between 7am to 7pm - Pager - [phone]  After 7pm go to [email or web address] - password [facility]  [facility] -  Office  [phone]

## 2015-12-31 NOTE — Progress Notes (Signed)
Patient complains of dizziness while ambulating. Directed to only walk with standby assistance.

## 2015-12-31 NOTE — Plan of Care (Signed)
Problem: Education: Goal: Knowledge of [facility] Education information/materials will improve Outcome: Completed/Met Date Met:  12/31/15 Pt has been educated throughout entire hospitalization on tests, procedures, and medication   Problem: Consults Goal: Cardiac Cath Patient Education (See Patient Education module for education specifics.)  Outcome: Completed/Met Date Met:  12/31/15 Pt received information regarding cardiac cath   Problem: Phase I Progression Outcomes Goal: Pain controlled with appropriate interventions Outcome: Progressing Pt still experiencing some chest pressure with activity.  Goal: Distal pulses equal to baseline Outcome: Completed/Met Date Met:  12/31/15 Distal pulses are equal to baseline  Goal: Vascular site scale level 0 - I Vascular Site Scale Level 0: No bruising/bleeding/hematoma Level I (Mild): Bruising/Ecchymosis, minimal bleeding/ooozing, palpable hematoma < 3 cm Level II (Moderate): Bleeding not affecting hemodynamic parameters, pseudoaneurysm, palpable hematoma > 3 cm Level III (Severe) Bleeding which affects hemodynamic parameters or retroperitoneal hemorrhage  Outcome: Completed/Met Date Met:  12/31/15 Right radial level 0 Goal: Post Cath/PCI return to appropriate Path Outcome: Completed/Met Date Met:  12/31/15 Pt went for cardiac cath

## 2015-12-31 NOTE — Progress Notes (Signed)
Patient refused CPAP for the night  

## 2015-12-31 NOTE — Progress Notes (Signed)
Pt ambulating in hallway and states he is having 2/10 chest pressure.  Kerin Ransom, [name] on floor and notified.  No new orders received.  Will continue to closely monitor.

## 2015-12-31 NOTE — Consult Note (Signed)
[address]       [place] [address]             [id number]          CARDIOTHORACIC SURGERY CONSULTATION REPORT  PCP is TAPPER,DAVID B, MD Referring Provider is KATZ, JEFFREY D, MD Primary Cardiologist is MCDOWELL, Aloha Gell, MD  Reason for consultation:  Severe multi-vessel CAD with unstable angina  HPI:  Patient is a 60 year old male with known history of coronary artery disease, hypertension, hypercholesterolemia, type 2 diabetes mellitus, obstructive sleep apnea, GE reflux disease, asthma, and TIAs who was admitted to the hospital with symptoms consistent with unstable angina and has been referred for surgical consultation to discuss treatment options for management of multivessel coronary artery disease.  Patient's cardiac history dates back approximately 15 years ago when he first presented with symptoms of angina pectoris. He was treated with PCI and stenting at that time. In January 2016 he presented with unstable angina and underwent PCI with placement of drug eluding stents in the proximal left anterior descending coronary artery and the first obtuse marginal branch of the left circumflex coronary artery.  He has been followed for the last several years by Dr. Domenic Polite through the [facility] office in [place].  The patient states that he remained stable until last week when he developed an episode of substernal chest pressure associated with dyspnea, diaphoresis, and nausea while he was working at [employer].  He was observed overnight in the emergency department and ruled out by serial cardiac enzymes. He was instructed to follow-up with his primary cardiologist, but symptoms recurred 2 days later and the patient was readmitted to the hospital. Chest pain resolved prior to admission following administration of aspirin and sublingual nitroglycerin by EMS. Baseline electrocardiogram reveals sinus rhythm without ST segment changes and the patient has ruled out for acute  myocardial infarction. Diagnostic cardiac catheterization performed by Dr. Alvester Chou demonstrates severe multivessel coronary artery disease with in-stent restenosis in the distal portion of the stents in the left anterior descending coronary artery and severe progression of disease in the distal left anterior descending coronary artery. The patient also has 70-80% stenosis in the left circumflex system with nonobstructive disease in the right coronary artery distribution. Left ventricular systolic function is preserved. Initially medical therapy was recommended, but surgical consultation has been requested.  The patient is married and lives in [place] with his wife and 3 children. He is on partial disability but works part-time at [employer].  He reports no specific physical limitations and had been doing well up until recently. He does report symptoms of exertional shortness of breath that have been stable. He denies any history of resting shortness of breath, PND, orthopnea, palpitations, or syncope. He has had occasional dizzy spells without syncope.   Past Medical History  Diagnosis Date  . Coronary atherosclerosis of native coronary artery     a. LAD & OM stenting;  b. 2007 Cath: nonobs dzs, patent stents;  c. 07/2012 neg MV, EF 61%.  d. 11/20/14: [place] s/p  PCI w/ DES to pLAD and DES to OM1   . Asthma   . Essential hypertension, benign   . TIA (transient ischemic attack) ~ 2010  . Anxiety   . Hypercholesteremia   . GERD (gastroesophageal reflux disease)   . OSA on CPAP   . Type 2 diabetes mellitus ([place])   . History of hiatal hernia   . Migraine   . Arthritis   .  Lumbar herniated disc   . Chronic lower back pain   . History of gout   . Thrombocytopenia ([place])   . MI, old     Past Surgical History  Procedure Laterality Date  . Anterior cervical decomp/discectomy fusion  1998    "C3-4"  . Carpal tunnel release Bilateral 2005  . Shoulder arthroscopy Left ~ 2011  . Knee surgery Left 02/2012     "scraped; open"  . Neuroplasty / transposition ulnar nerve at elbow Right ~ 2012  . Left heart catheterization with coronary angiogram N/A 07/20/2012    Procedure: LEFT HEART CATHETERIZATION WITH CORONARY ANGIOGRAM;  Surgeon: [name], MD;  Location: [facility] CATH LAB;  Service: Cardiovascular;  Laterality: N/A;  . Coronary angioplasty with stent placement  2002; 2003; 11/20/2014    "I have 4 stents after today" (11/20/2014)  . Cardiac catheterization  "several"  . Coronary angioplasty    . Left heart catheterization with coronary angiogram N/A 11/20/2014    Procedure: LEFT HEART CATHETERIZATION WITH CORONARY ANGIOGRAM;  Surgeon: [name], MD;  Location: [facility] CATH LAB;  Service: Cardiovascular;  Laterality: N/A;  . Percutaneous coronary rotoblator intervention (pci-r)  11/20/2014    Procedure: PERCUTANEOUS CORONARY ROTOBLATOR INTERVENTION (PCI-R);  Surgeon: [name], MD;  Location: [facility] CATH LAB;  Service: Cardiovascular;;  . Left heart catheterization with coronary angiogram N/A 11/26/2014    Procedure: LEFT HEART CATHETERIZATION WITH CORONARY ANGIOGRAM;  Surgeon: [name], MD;  Location: [facility] CATH LAB;  Service: Cardiovascular;  Laterality: N/A;  . Cardiac catheterization N/A 12/30/2015    Procedure: Left Heart Cath and Coronary Angiography;  Surgeon: [name], MD;  Location: [place] CV LAB;  Service: Cardiovascular;  Laterality: N/A;    Family History  Problem Relation Age of Onset  . Coronary artery disease Father 26    Social History   Social History  . Marital Status: Married    Spouse Name: [name]  . Number of Children: 3  . Years of Education: 12 th    Occupational History  . Unemployed    Social History Main Topics  . Smoking status: Never Smoker   . Smokeless tobacco: Never Used  . Alcohol Use: No  . Drug Use: No  . Sexual Activity: Yes   Other Topics Concern  . Not on file   Social History Narrative   Patient lives at home with wife  [name].   Patient works at [employer], Engineer, petroleum.   Patient has a 12 th grade education.    Patient has 3 children.           Prior to Admission medications   Medication Sig Start Date End Date Taking? Authorizing Provider  acetaminophen (TYLENOL) 500 MG tablet Take 1,000 mg by mouth every 6 (six) hours as needed (pain).   Yes Historical Provider, MD  albuterol (PROVENTIL HFA;VENTOLIN HFA) 108 (90 BASE) MCG/ACT inhaler Inhale 2 puffs into the lungs every 6 (six) hours as needed for wheezing or shortness of breath.   Yes Historical Provider, MD  albuterol (PROVENTIL) (2.5 MG/3ML) 0.083% nebulizer solution Take 2.5 mg by nebulization every 6 (six) hours as needed for wheezing or shortness of breath.   Yes Historical Provider, MD  amLODipine (NORVASC) 2.5 MG tablet TAKE 1 TABLET BY MOUTH DAILY 11/05/15  Yes [name], MD  aspirin EC 81 MG tablet Take 81 mg by mouth at bedtime.    Yes Historical Provider, MD  citalopram (CELEXA) 20 MG  tablet Take 20 mg by mouth at bedtime.    Yes Historical Provider, MD  clopidogrel (PLAVIX) 75 MG tablet Take 75 mg by mouth daily with breakfast.    Yes Historical Provider, MD  fluticasone (FLONASE) 50 MCG/ACT nasal spray Place 1 spray into the nose 2 (two) times daily.    Yes Historical Provider, MD  Fluticasone-Salmeterol (ADVAIR) 250-50 MCG/DOSE AEPB Inhale 1 puff into the lungs every 12 (twelve) hours.   Yes Historical Provider, MD  gabapentin (NEURONTIN) 400 MG capsule Take 400 mg by mouth 3 (three) times daily.   Yes Historical Provider, MD  ibuprofen (ADVIL,MOTRIN) 200 MG tablet Take 400-800 mg by mouth every 6 (six) hours as needed (pain).   Yes Historical Provider, MD  insulin aspart (NOVOLOG FLEXPEN) 100 UNIT/ML FlexPen Inject 1-15 Units into the skin 3 (three) times daily as needed for high blood sugar (CBG >120). Per sliding scale written down at home   Yes Historical Provider, MD  Insulin Glargine (LANTUS SOLOSTAR) 100 UNIT/ML  Solostar Pen Inject 60 Units into the skin 2 (two) times daily.   Yes Historical Provider, MD  Melatonin 5 MG TABS Take 10 mg by mouth at bedtime.    Yes Historical Provider, MD  metoprolol (LOPRESSOR) 50 MG tablet Take 1 tablet (50 mg total) by mouth daily. Patient taking differently: Take 50 mg by mouth at bedtime.  06/04/15  Yes [name], MD  nitroGLYCERIN (NITROSTAT) 0.4 MG SL tablet Place 0.4 mg under the tongue every 5 (five) minutes as needed for chest pain.    Yes Historical Provider, MD  omeprazole (PRILOSEC) 20 MG capsule Take 20 mg by mouth 2 (two) times daily before a meal.   Yes Historical Provider, MD  ramipril (ALTACE) 5 MG tablet Take 5 mg by mouth at bedtime.    Yes Historical Provider, MD  terbinafine (LAMISIL) 250 MG tablet Take 250 mg by mouth at bedtime. For toe fungus   Yes Historical Provider, MD  metFORMIN (GLUCOPHAGE) 1000 MG tablet Take 1 tablet (1,000 mg total) by mouth 2 (two) times daily. 01/02/16   Thurnell Lose, MD    Current Facility-Administered Medications  Medication Dose Route Frequency Provider Last Rate Last Dose  . 0.9 %  sodium chloride infusion  250 mL Intravenous PRN [name], MD      . acetaminophen (TYLENOL) tablet 650 mg  650 mg Oral Q4H PRN Edwin Dada, MD   650 mg at 12/31/15 0817  . amLODipine (NORVASC) tablet 2.5 mg  2.5 mg Oral BID Erlene Quan, PA-C   2.5 mg at 12/31/15 6063  . aspirin chewable tablet 324 mg  324 mg Oral Once Margarita Mail, PA-C   324 mg at 12/28/15 1931  . aspirin chewable tablet 81 mg  81 mg Oral Daily [name], MD   81 mg at 12/31/15 0818  . citalopram (CELEXA) tablet 20 mg  20 mg Oral QHS Edwin Dada, MD   20 mg at 12/30/15 2022  . clopidogrel (PLAVIX) tablet 75 mg  75 mg Oral Daily Edwin Dada, MD   75 mg at 12/31/15 0818  . enoxaparin (LOVENOX) injection 40 mg  40 mg Subcutaneous Q24H Edwin Dada, MD   40 mg at 12/31/15 0818  . gabapentin (NEURONTIN)  capsule 400 mg  400 mg Oral TID Edwin Dada, MD   400 mg at 12/31/15 0818  . gi cocktail (Maalox,Lidocaine,Donnatal)  30 mL Oral QID PRN Edwin Dada,  MD      . insulin aspart (novoLOG) injection 0-15 Units  0-15 Units Subcutaneous TID WC Edwin Dada, MD   3 Units at 12/31/15 1152  . insulin aspart (novoLOG) injection 0-5 Units  0-5 Units Subcutaneous QHS Edwin Dada, MD   2 Units at 12/30/15 2200  . insulin glargine (LANTUS) injection 30 Units  30 Units Subcutaneous BID Doreene Burke [place], [place]   30 Units at 12/31/15 9373  . metoprolol (LOPRESSOR) tablet 50 mg  50 mg Oral BID Thurnell Lose, MD   50 mg at 12/31/15 0818  . mometasone-formoterol (DULERA) 100-5 MCG/ACT inhaler 2 puff  2 puff Inhalation BID Edwin Dada, MD   2 puff at 12/31/15 0745  . morphine 2 MG/ML injection 2 mg  2 mg Intravenous Q1H PRN [name], MD   2 mg at 12/30/15 1707  . nitroGLYCERIN (NITRODUR - Dosed in mg/24 hr) patch 0.2 mg  0.2 mg Transdermal Daily Erlene Quan, PA-C   0.2 mg at 12/31/15 4287  . ondansetron (ZOFRAN) injection 4 mg  4 mg Intravenous Q6H PRN Edwin Dada, MD   4 mg at 12/29/15 1735  . pantoprazole (PROTONIX) EC tablet 40 mg  40 mg Oral Daily Edwin Dada, MD   40 mg at 12/31/15 0818  . ramipril (ALTACE) capsule 5 mg  5 mg Oral QHS Edwin Dada, MD   5 mg at 12/30/15 2023  . sodium chloride flush (NS) 0.9 % injection 3 mL  3 mL Intravenous Q12H [name], MD   3 mL at 12/30/15 1700  . sodium chloride flush (NS) 0.9 % injection 3 mL  3 mL Intravenous PRN [name], MD      . terbinafine (LAMISIL) tablet 250 mg  250 mg Oral QHS Edwin Dada, MD   250 mg at 12/30/15 2023    Allergies  Allergen Reactions  . Imdur [Isosorbide Dinitrate] Other (See Comments)    Severe headache  . Statins Other (See Comments)    Muscle aches and cramps  . Tricor [Fenofibrate] Other (See Comments)    Leg cramps  . Ranexa  [Ranolazine] Other (See Comments)      Review of Systems:   General:  normal appetite, normal energy, no weight gain, no weight loss, no fever  Cardiac:  + chest pain with exertion, no chest pain at rest, + SOB with exertion, no resting SOB, no PND, no orthopnea, no palpitations, no arrhythmia, no atrial fibrillation, no LE edema, + dizzy spells, no syncope  Respiratory:  + exertional shortness of breath, no home oxygen, no productive cough, no dry cough, no bronchitis, no wheezing, no hemoptysis, + asthma, no pain with inspiration or cough, + sleep apnea, + CPAP at night  GI:   no difficulty swallowing, no reflux, no frequent heartburn, no hiatal hernia, no abdominal pain, no constipation, no diarrhea, no hematochezia, no hematemesis, no melena  GU:   no dysuria,  no frequency, no urinary tract infection, no hematuria, no enlarged prostate, no kidney stones, no kidney disease  Vascular:  no pain suggestive of claudication, no pain in feet, no leg cramps, no varicose veins, no DVT, no non-healing foot ulcer  Neuro:   no stroke, + remote h/o TIA's, no seizures, no headaches, no temporary blindness one eye,  no slurred speech, no peripheral neuropathy, no chronic pain, no instability of gait, no memory/cognitive dysfunction  Musculoskeletal: + arthritis - primarily involving the back, hands  and ankles, no joint swelling, no myalgias, no difficulty walking, normal mobility   Skin:   no rash, no itching, no skin infections, no pressure sores or ulcerations  Psych:   no anxiety, + depression, no nervousness, no unusual recent stress  Eyes:   + blurry vision, no floaters, no recent vision changes, + wears glasses or contacts  ENT:   no hearing loss, no loose or painful teeth, no dentures, last saw dentist within the past year  Hematologic:  no easy bruising, no abnormal bleeding, no clotting disorder, no frequent epistaxis  Endocrine:  + diabetes, does check CBG's at home     Physical Exam:   BP  139/69 mmHg  Pulse 72  Temp(Src) 98.3 F (36.8 C) (Oral)  Resp 15  Ht 5' 9"  (1.753 m)  Wt 231 lb 4.8 oz (104.917 kg)  BMI 34.14 kg/m2  SpO2 94%  General:  Mildly obese,  well-appearing  HEENT:  Unremarkable   Neck:   no JVD, no bruits, no adenopathy   Chest:   clear to auscultation, symmetrical breath sounds, no wheezes, no rhonchi   CV:   RRR, no  murmur   Abdomen:  soft, non-tender, no masses   Extremities:  warm, well-perfused, pulses palpable with normal Allen's test on left arm, no lower extremity edema  Rectal/GU  Deferred  Neuro:   Grossly non-focal and symmetrical throughout  Skin:   Clean and dry, no rashes, no breakdown   Diagnostic Tests:  CARDIAC CATHETERIZATION Procedures    Left Heart Cath and Coronary Angiography    Conclusion     Ost RPDA lesion, 80% stenosed.  Lat 1st Mrg-1 lesion, 70% stenosed.  1st Mrg-1 lesion, 70% stenosed.  1st Mrg-2 lesion, 80% stenosed.  1st Diag lesion, 80% stenosed.  Mid LAD lesion, 75% stenosed.  Dist LAD-1 lesion, 75% stenosed.  Dist LAD-2 lesion, 90% stenosed.  The left ventricular systolic function is normal.  DIARRA KOS is a 60 y.o. male   [id number] LOCATION: FACILITY: [facility]  PHYSICIAN: Quay Burow, M.D. October 01, 1956   DATE OF PROCEDURE: 12/30/2015  DATE OF DISCHARGE:     CARDIAC CATHETERIZATION     History obtained from chart review. Mr. Nicolini is 60 y/o Caucasian male with a history of CAD. He had cardiac catheterization performed January 2015 with PCI and stenting of his LAD and LCX-OM branch by Dr [name]. He was admitted with Unstable angina. He presents now for cath and potential intervention.    IMPRESSION:Mr. Muehl has patent stent with progression of his mid-distal LAD in a 1.5-1.75mm vessel. Circumflex is unchanged from his prior angiogram. Continued medical Rx will be recommended. The sheath was removed and a TR band on the right wrist to achieve patent hemostasis.  Quay Burow. MD, [facility] 12/30/2015 1:07 PM        Indications    Coronary artery disease due to lipid rich plaque [I25.10 (ICD-10-CM)]    Technique and Indications    PROCEDURE DESCRIPTION:   The patient was brought to the second floor [facility]iac cath lab in the postabsorptive state. He was  premedicated with Valium 5 mg , iv versed and fentanyl. [name] His right wristwas prepped and shaved in usual sterile fashion. Xylocaine 1% was used for local anesthesia. A 6 French sheath was inserted into the right radial artery using standard Seldinger technique. The patient received 5000 units of heparin intravenously. A 5 [place] TIG catheter and pigtail catheter were used for cornary angiography and left venticulography.  Omnipaque dye was used for the entirety of the case.Retrograde aortic, left ventricular and back pressure were recorded. The patient received radial cocktail via the SideArm sheath.  Estimated blood loss <50 mL. There were no immediate complications during the procedure.    Coronary Findings    Dominance: Right   Left Anterior Descending   . Prox LAD to Mid LAD lesion, 0% stenosed. Previously placed Prox LAD to Mid LAD stent (unknown type) is patent.   . Mid LAD lesion, 75% stenosed.   . Dist LAD-1 lesion, 75% stenosed.   . Dist LAD-2 lesion, 90% stenosed.   . First Diagonal Branch   . 1st Diag lesion, 80% stenosed.     Left Circumflex   . First Obtuse Marginal Branch   . 1st Mrg-1 lesion, 70% stenosed.   . 1st Mrg-2 lesion, 80% stenosed.   . Lateral First Obtuse Marginal Branch   . Lat 1st Mrg-1 lesion, 70% stenosed.   . Lat 1st Mrg-2 lesion, 0% stenosed. Previously placed Lat 1st Mrg-2 stent (unknown type) is patent.     Right Coronary Artery   . Right Posterior Descending Artery   . Ost RPDA lesion, 80% stenosed.      Wall Motion                 Left Heart    Left Ventricle The left ventricular size is normal. The left ventricular systolic  function is normal. The left ventricular ejection fraction is 55-65% by visual estimate. There are no wall motion abnormalities in the left ventricle.    Coronary Diagrams    Diagnostic Diagram            Implants     No implant documentation for this case.    PACS Images    Show images for Cardiac catheterization     Link to Procedure Log    Procedure Log      Hemo Data       Most Recent Value   AO Systolic Pressure  537 mmHg   AO Diastolic Pressure  69 mmHg   AO Mean  93 mmHg   LV Systolic Pressure  482 mmHg   LV Diastolic Pressure  8 mmHg   LV EDP  13 mmHg         Impression:  Patient has severe multivessel coronary artery disease with preserved left ventricular systolic function. He presents with recent onset symptoms of chest pain consistent with unstable angina. Symptoms have improved but not resolved completely since he has been in the hospital on medical therapy. I have personally reviewed the patient's diagnostic cardiac catheterization. He has diffuse in-stent restenosis in the distal portion of the stents in the mid left anterior descending coronary artery with multiple areas of long segment high-grade stenosis in the distal left anterior descending coronary artery beyond the terminal portion of the stents. Unfortunately, the distal portion of the LAD does not appear to be a very good vessel although there may be an area where bypass grafting could be performed out close to the apex.  There is also significant disease in the left circumflex territory with 70-80% stenosis prior to a large second obtuse marginal branch. There is nonobstructive disease in the right coronary territory.    Because the distal portion of the left anterior descending coronary artery appears to be a relatively poor target vessel for grafting, an attempt at medical therapy may be reasonable.  With an attempt at coronary artery bypass grafting the patient  might be at  increased risk for perioperative myocardial infarction and/or premature graft failure.  However, this patient is relatively young and I remain skeptical that he will do well with medical therapy alone. I do not feel that it would make sense to attempt repeat percutaneous coronary intervention.  I would favor surgical revascularization if the patient continues to symptoms despite maximal medical therapy.    Plan:  I have reviewed the results of the patient's diagnostic cardiac catheterization with the patient this afternoon. Alternative treatment strategies have been discussed in detail. All of his questions have been addressed. We will plan to hold Plavix temporarily and see how the patient does with an attempt at medical therapy. If he does well then Plavix should be resumed prior to hospital discharge. If he continues to have symptoms of angina despite maximal medical therapy we can make plans for surgical revascularization next week.   I spent in excess of 120 minutes during the conduct of this hospital consultation and >50% of this time involved direct face-to-face encounter for counseling and/or coordination of the patient's care.    Valentina Gu. Roxy Manns, MD 12/31/2015 3:07 PM

## 2016-01-01 ENCOUNTER — Other Ambulatory Visit: Payer: Self-pay | Admitting: *Deleted

## 2016-01-01 DIAGNOSIS — I25110 Atherosclerotic heart disease of native coronary artery with unstable angina pectoris: Secondary | ICD-10-CM | POA: Diagnosis not present

## 2016-01-01 DIAGNOSIS — R079 Chest pain, unspecified: Secondary | ICD-10-CM | POA: Diagnosis not present

## 2016-01-01 DIAGNOSIS — I2510 Atherosclerotic heart disease of native coronary artery without angina pectoris: Secondary | ICD-10-CM

## 2016-01-01 LAB — URINE MICROSCOPIC-ADD ON: BACTERIA UA: NONE SEEN

## 2016-01-01 LAB — URINALYSIS, ROUTINE W REFLEX MICROSCOPIC
BILIRUBIN URINE: NEGATIVE
Glucose, UA: >1000 mg/dL — AB
HGB URINE DIPSTICK: NEGATIVE
KETONES UR: NEGATIVE mg/dL
Leukocytes, UA: NEGATIVE
NITRITE: NEGATIVE
PH: 6 (ref 5.0–8.0)
Protein, ur: NEGATIVE mg/dL
SPECIFIC GRAVITY, URINE: 1.014 (ref 1.005–1.030)

## 2016-01-01 LAB — GLUCOSE, CAPILLARY
GLUCOSE-CAPILLARY: 102 mg/dL — AB (ref 65–99)
GLUCOSE-CAPILLARY: 160 mg/dL — AB (ref 65–99)
Glucose-Capillary: 307 mg/dL — ABNORMAL HIGH (ref 65–99)
Glucose-Capillary: 202 mg/dL — ABNORMAL HIGH (ref 65–99)

## 2016-01-01 NOTE — Progress Notes (Signed)
Patient Name:  [name] [name], DOB: [birth year], MRN: [id number] Primary Doctor: Deloria Lair, MD Primary Cardiologist:   Date: 01/01/2016   SUBJECTIVE:  The patient is stable. He does continue to have mild chest discomfort when walking in the hall. I have had extensive discussions with the patient and his wife. I spoke with Dr. Roxy Manns yesterday afternoon about the patient's surgery consult.   Past Medical History  Diagnosis Date  . Coronary atherosclerosis of native coronary artery     a. LAD & OM stenting;  b. 2007 Cath: nonobs dzs, patent stents;  c. 07/2012 neg MV, EF 61%.  d. 11/20/14: [place] s/p  PCI w/ DES to pLAD and DES to OM1   . Asthma   . Essential hypertension, benign   . TIA (transient ischemic attack) ~ 2010  . Anxiety   . Hypercholesteremia   . GERD (gastroesophageal reflux disease)   . OSA on CPAP   . Type 2 diabetes mellitus ([place])   . History of hiatal hernia   . Migraine   . Arthritis   . Lumbar herniated disc   . Chronic lower back pain   . History of gout   . Thrombocytopenia ([place])   . MI, old    Filed Vitals:   12/31/15 1014 12/31/15 1334 12/31/15 1949 01/01/16 0345  BP: 137/77 139/69 149/73 109/67  Pulse:  72 67 60  Temp:  98.3 F (36.8 C) 98.7 F (37.1 C) 98.5 F (36.9 C)  TempSrc:  Oral Oral Oral  Resp: 16 15 17 19   Height:      Weight:    229 lb 9.6 oz (104.146 kg)  SpO2: 98% 94% 97% 98%    Intake/Output Summary (Last 24 hours) at 01/01/16 0936 Last data filed at 01/01/16 0348  Gross per 24 hour  Intake    240 ml  Output   2725 ml  Net  -2485 ml   Filed Weights   12/30/15 0623 12/31/15 0508 01/01/16 0345  Weight: 229 lb 1.6 oz (103.919 kg) 231 lb 4.8 oz (104.917 kg) 229 lb 9.6 oz (104.146 kg)     LABS: Basic Metabolic Panel:  Recent Labs  12/30/15 0612 12/31/15 0457  NA 140 140  K 4.2 3.9  CL 106 105  CO2 25 26  GLUCOSE 142* 124*  BUN 15 20  CREATININE 1.07 1.24  CALCIUM 9.1 9.0   Liver Function Tests: No results  for input(s): AST, ALT, ALKPHOS, BILITOT, PROT, ALBUMIN in the last 72 hours. No results for input(s): LIPASE, AMYLASE in the last 72 hours. CBC:  Recent Labs  12/30/15 0612 12/31/15 0457  WBC 3.1* 3.8*  HGB 14.6 14.5  HCT 41.1 40.7  MCV 90.3 90.8  PLT 94* 102*   Cardiac Enzymes: No results for input(s): CKTOTAL, CKMB, CKMBINDEX, TROPONINI in the last 72 hours. BNP: Invalid input(s): POCBNP D-Dimer: No results for input(s): DDIMER in the last 72 hours. Thyroid Function Tests: No results for input(s): TSH, T4TOTAL, T3FREE, THYROIDAB in the last 72 hours.  Invalid input(s): FREET3  RADIOLOGY: Dg Chest 2 View  12/23/2015  CLINICAL DATA:  60 year old male with left-sided chest pain since earlier this morning EXAM: CHEST  2 VIEW COMPARISON:  Prior chest x-ray 12/03/2014 FINDINGS: The lungs are clear and negative for focal airspace consolidation, pulmonary edema or suspicious pulmonary nodule. No pleural effusion or pneumothorax. Cardiac and mediastinal contours are within normal limits. Metallic stent projects the left heart. No acute fracture or lytic or  blastic osseous lesions. The visualized upper abdominal bowel gas pattern is unremarkable. IMPRESSION: No active cardiopulmonary disease. Electronically Signed   By: Jacqulynn Cadet M.D.   On: 12/23/2015 18:46   Dg Chest Port 1 View  12/28/2015  CLINICAL DATA:  Shortness of breath with chest pressure. Lightheadedness today. EXAM: PORTABLE CHEST 1 VIEW COMPARISON:  Frontal and lateral views 12/23/2015 FINDINGS: The cardiomediastinal contours are normal. Cardiac stent in place. The lungs are clear. Pulmonary vasculature is normal. No consolidation, pleural effusion, or pneumothorax. No acute osseous abnormalities are seen. IMPRESSION: No acute pulmonary process. Electronically Signed   By: Jeb Levering M.D.   On: 12/28/2015 20:08    PHYSICAL EXAM  patient is oriented to person time and place. Affect is normal. Lungs are clear.  Respiratory effort is unlabored. Cardiac exam reveals S1 and S2. Abdomen is soft. There is no peripheral edema.   ASSESSMENT AND PLAN:    Chest pain with high risk of acute coronary syndrome:     I had a long and careful discussion with Dr. Roxy Manns yesterday about the patient. I had a long discussion with the patient and his wife today. The patient continues to have some chest discomfort when walking in the hall here in the hospital. Based on careful and extensive review of all issues, the patient and his wife and I are all in agreement that we favor proceeding with bypass surgery during this admission. Plavix has already been put on hold. We will await further input from Dr. Roxy Manns concerning timing of surgery.   Dola Argyle 01/01/2016 9:36 AM

## 2016-01-01 NOTE — Progress Notes (Signed)
      [address]       [place] [address]             [phone]     CARDIOTHORACIC SURGERY PROGRESS NOTE  2 Days Post-Op  S/P Procedure(s) (LRB): Left Heart Cath and Coronary Angiography (N/A)  Subjective: 3 brief episodes of chest discomfort yesterday and 1 this morning, all during ambulation.  No prolonged episodes and no rest pain.  Objective: Vital signs in last 24 hours: Temp:  [98.5 F (36.9 C)-98.7 F (37.1 C)] 98.6 F (37 C) (02/22 1343) Pulse Rate:  [60-67] 65 (02/22 1343) Cardiac Rhythm:  [-] Normal sinus rhythm (02/22 0704) Resp:  [15-19] 15 (02/22 1343) BP: (109-149)/(67-73) 137/71 mmHg (02/22 1343) SpO2:  [95 %-98 %] 95 % (02/22 1343) Weight:  [229 lb 9.6 oz (104.146 kg)] 229 lb 9.6 oz (104.146 kg) (02/22 0345)  Physical Exam:  Rhythm:   sinus  Breath sounds: clear  Heart sounds:  RRR  Incisions:  n/a  Abdomen:  soft  Extremities:  warm   Intake/Output from previous day: 02/21 0701 - 02/22 0700 In: 480 [P.O.:480] Out: 2725 [Urine:2725] Intake/Output this shift: Total I/O In: 480 [P.O.:480] Out: 1600 [Urine:1600]  Lab Results:  Recent Labs  12/30/15 0612 12/31/15 0457  WBC 3.1* 3.8*  HGB 14.6 14.5  HCT 41.1 40.7  PLT 94* 102*   BMET:  Recent Labs  12/30/15 0612 12/31/15 0457  NA 140 140  K 4.2 3.9  CL 106 105  CO2 25 26  GLUCOSE 142* 124*  BUN 15 20  CREATININE 1.07 1.24  CALCIUM 9.1 9.0    CBG (last 3)   Recent Labs  12/31/15 1623 12/31/15 1952 01/01/16 0733  GLUCAP 208* 176* 102*   PT/INR:   Recent Labs  12/30/15 0612  LABPROT 15.4*  INR 1.20    CXR:  N/A  Assessment/Plan: S/P Procedure(s) (LRB): Left Heart Cath and Coronary Angiography (N/A)  Mr. Bollig continues to have symptoms of exertional angina despite maximal medical therapy.  He has decided that he would like to proceed with CABG.    Will tentatively plan for OR on Thursday March 2nd.  If he remains stable overnight I think it  would be reasonable to let him go home during the interim period of time.  He understands that he should not be driving a car, going to work, or doing anything that's even slightly strenuous.  Will follow up in the morning tomorrow.  I spent in excess of 15 minutes during the conduct of this hospital encounter and >50% of this time involved direct face-to-face encounter with the patient for counseling and/or coordination of their care.   Rexene Alberts, MD 01/01/2016 3:22 PM

## 2016-01-01 NOTE — Progress Notes (Signed)
OHS booklet given, IS given. Attempted to let him OHS video but had indicated he wants his daughter to watch with him.

## 2016-01-01 NOTE — Progress Notes (Signed)
   01/01/16 1458  Clinical Encounter Type  Visited With Patient  Visit Type Spiritual support  Referral From Social work  Spiritual Encounters  Spiritual Needs Literature;Other (Comment) (Adv Dir)  Chaplain received request for adv dir but ran out of time before volunteers left for day. Spoke to Mr. Holaway and apologized. He was very understanding and said tomorrow would be fine. Left form with him. Nashton Belson, Chaplain

## 2016-01-01 NOTE — Progress Notes (Signed)
Patient Demographics:    [name] [name], is a 60 y.o. male, DOB - [birth year], WYS:[id number]  Admit date - 12/28/2015   Admitting Physician Edwin Dada, MD  Outpatient Primary MD for the patient is Deloria Lair, MD  LOS -    Chief Complaint  Patient presents with  . Chest Pain        Subjective:    [name] [name] today has, No headache, No chest pain, No abdominal pain - No Nausea, No new weakness tingling or numbness, No Cough - SOB.     Assessment  & Plan :     1. Chest Pain in a patient with history of CAD - appears to be classic/typical in terms of symptoms, ruled out for MI, stable d-dimer, EKG was nonacute, seen by cardiology, currently on aspirin and home beta blocker, echogram was stable with preserved EF, left heart cath showed multivessel disease but with patent stent, still has exertional symptoms, multiple drug allergies which include statin, TriCor, Ranexa and Imdur. Per cardiology cardio thoracic surgery was consulted for a possible bypass. Plavix currently on hold. Patient still symptomatic upon walking, will defer timing of bypass surgery to cardiothoracic team.  2. Essential hypertension. Blood pressure stable on combination of Norvasc, ACE inhibitor, resume home dose beta blocker.  3. GERD. On PPI continue.  4. Diabetic neuropathy. On Neurontin home dose. Will be continued unchanged.   5. DM type II. On Lantus and sliding scale continue to monitor.  Lab Results  Component Value Date   HGBA1C 7.9* 12/29/2015    CBG (last 3)   Recent Labs  12/31/15 1623 12/31/15 1952 01/01/16 0733  GLUCAP 208* 176* 102*      Code Status : Full  Family Communication  : None Present  Disposition Plan  :  Home likely post Cath  Consults  :  Cards, Cardio Thoracic  Surg  Procedures  :   L Heart Cath  History obtained from chart review. [name] [name] is 60 y/o Caucasian male with a history of CAD. He had cardiac catheterization performed January 2015 with PCI and stenting of his LAD and LCX-OM branch by Dr [name]. He was admitted with Unstable angina. He presents now for cath and potential intervention.  IMPRESSION :[name] [name] has patent stent with progression of his mid-distal LAD in a 1.5-1.75mm vessel. Circumflex is unchanged from his prior angiogram. Continued medical Rx will be recommended. The sheath was removed and a TR band on the right wrist to achieve patent hemostasis.  Quay Burow. MD, [facility] 12/30/2015 1:07 PM   TTE  Left ventricle: The cavity size was normal. Wall thickness wasincreased in a pattern of mild LVH. Systolic function was normal. The estimated ejection fraction was in the range of 60% to 65%.Wall motion was normal; there were no regional wall motion abnormalities. Doppler parameters are consistent with abnormal left ventricular relaxation (grade 1 diastolic dysfunction). - Aortic valve: There was mild to moderate regurgitation. Valvearea (VTI): 2.47 cm^2. Valve area (Vmax): 2.43 cm^2. - Left atrium: The atrium was moderately dilated. - Technically difficult study. Echocontrast was used to enhance visualization.  DVT Prophylaxis  :  Lovenox   Lab Results  Component Value Date   PLT 102* 12/31/2015    Inpatient Medications  Scheduled Meds: . amLODipine  2.5 mg Oral BID  . aspirin  324 mg Oral Once  . aspirin  81 mg Oral Daily  . citalopram  20 mg Oral QHS  . enoxaparin (LOVENOX) injection  40 mg Subcutaneous Q24H  . gabapentin  400 mg Oral TID  . insulin aspart  0-15 Units Subcutaneous TID WC  . insulin aspart  0-5 Units Subcutaneous QHS  . insulin glargine  30 Units Subcutaneous BID  . metoprolol tartrate  50 mg Oral BID  . mometasone-formoterol  2 puff Inhalation BID  . nitroGLYCERIN  0.2 mg Transdermal  Daily  . pantoprazole  40 mg Oral Daily  . ramipril  5 mg Oral QHS  . sodium chloride flush  3 mL Intravenous Q12H  . terbinafine  250 mg Oral QHS   Continuous Infusions:  PRN Meds:.sodium chloride, acetaminophen, gi cocktail, morphine injection, ondansetron (ZOFRAN) IV, sodium chloride flush  Antibiotics  :     Anti-infectives    Start     Dose/Rate Route Frequency Ordered Stop   12/28/15 2315  terbinafine (LAMISIL) tablet 250 mg     250 mg Oral Daily at bedtime 12/28/15 2306          Objective:   Filed Vitals:   12/31/15 1334 12/31/15 1949 01/01/16 0345 01/01/16 0952  BP: 139/69 149/73 109/67 131/73  Pulse: 72 67 60   Temp: 98.3 F (36.8 C) 98.7 F (37.1 C) 98.5 F (36.9 C)   TempSrc: Oral Oral Oral   Resp: 15 17 19    Height:      Weight:   104.146 kg (229 lb 9.6 oz)   SpO2: 94% 97% 98%     Wt Readings from Last 3 Encounters:  01/01/16 104.146 kg (229 lb 9.6 oz)  12/23/15 103.783 kg (228 lb 12.8 oz)  10/29/15 106.595 kg (235 lb)     Intake/Output Summary (Last 24 hours) at 01/01/16 1008 Last data filed at 01/01/16 0900  Gross per 24 hour  Intake    480 ml  Output   2725 ml  Net  -2245 ml     Physical Exam  Awake Alert, Oriented X 3, No new F.N deficits, Normal affect [place].AT,PERRAL Supple Neck,No JVD, No cervical lymphadenopathy appriciated.  Symmetrical Chest wall movement, Good air movement bilaterally, CTAB RRR,No Gallops,Rubs or new Murmurs, No Parasternal Heave +ve B.Sounds, Abd Soft, No tenderness, No organomegaly appriciated, No rebound - guarding or rigidity. No Cyanosis, Clubbing or edema, No new Rash or bruise     Data Review:   Micro Results No results found for this or any previous visit (from the past 240 hour(s)).  Radiology Reports Dg Chest 2 View  12/23/2015  CLINICAL DATA:  60 year old male with left-sided chest pain since earlier this morning EXAM: CHEST  2 VIEW COMPARISON:  Prior chest x-ray 12/03/2014 FINDINGS: The lungs are  clear and negative for focal airspace consolidation, pulmonary edema or suspicious pulmonary nodule. No pleural effusion or pneumothorax. Cardiac and mediastinal contours are within normal limits. Metallic stent projects the left heart. No acute fracture or lytic or blastic osseous lesions. The visualized upper abdominal bowel gas pattern is unremarkable. IMPRESSION: No active cardiopulmonary disease. Electronically Signed   By: Jacqulynn Cadet M.D.   On: 12/23/2015 18:46   Dg Chest Port 1 View  12/28/2015  CLINICAL DATA:  Shortness of breath with chest pressure. Lightheadedness today. EXAM: PORTABLE CHEST 1 VIEW COMPARISON:  Frontal and lateral views 12/23/2015 FINDINGS: The cardiomediastinal contours are normal.  Cardiac stent in place. The lungs are clear. Pulmonary vasculature is normal. No consolidation, pleural effusion, or pneumothorax. No acute osseous abnormalities are seen. IMPRESSION: No acute pulmonary process. Electronically Signed   By: Jeb Levering M.D.   On: 12/28/2015 20:08     CBC  Recent Labs Lab 12/28/15 1943 12/30/15 0612 12/31/15 0457  WBC 3.5* 3.1* 3.8*  HGB 14.3 14.6 14.5  HCT 41.1 41.1 40.7  PLT 106* 94* 102*  MCV 90.1 90.3 90.8  MCH 31.4 32.1 32.4  MCHC 34.8 35.5 35.6  RDW 12.9 12.7 12.7    Chemistries   Recent Labs Lab 12/28/15 1943 12/30/15 0612 12/31/15 0457  NA 139 140 140  K 4.5 4.2 3.9  CL 106 106 105  CO2 22 25 26   GLUCOSE 235* 142* 124*  BUN 21* 15 20  CREATININE 1.11 1.07 1.24  CALCIUM 9.4 9.1 9.0  AST 33  --   --   ALT 43  --   --   ALKPHOS 40  --   --   BILITOT 0.7  --   --    ------------------------------------------------------------------------------------------------------------------ No results for input(s): CHOL, HDL, LDLCALC, TRIG, CHOLHDL, LDLDIRECT in the last 72 hours.  Lab Results  Component Value Date   HGBA1C 7.9* 12/29/2015    ------------------------------------------------------------------------------------------------------------------ No results for input(s): TSH, T4TOTAL, T3FREE, THYROIDAB in the last 72 hours.  Invalid input(s): FREET3 ------------------------------------------------------------------------------------------------------------------ No results for input(s): VITAMINB12, FOLATE, FERRITIN, TIBC, IRON, RETICCTPCT in the last 72 hours.  Coagulation profile  Recent Labs Lab 12/30/15 0612  INR 1.20    No results for input(s): DDIMER in the last 72 hours.  Cardiac Enzymes  Recent Labs Lab 12/28/15 2320 12/29/15 0132 12/29/15 0458  TROPONINI <0.03 <0.03 <0.03   ------------------------------------------------------------------------------------------------------------------    Component Value Date/Time   BNP 10.6 12/03/2014 1640    Time Spent in minutes  35   Thane Age K M.D on 01/01/2016 at 10:08 AM  Between 7am to 7pm - Pager - [phone]  After 7pm go to [email or web address] - password [facility]  [facility] -  Office  [phone]

## 2016-01-02 ENCOUNTER — Other Ambulatory Visit: Payer: Self-pay | Admitting: *Deleted

## 2016-01-02 ENCOUNTER — Observation Stay (HOSPITAL_COMMUNITY): Payer: Medicare Other

## 2016-01-02 DIAGNOSIS — I2510 Atherosclerotic heart disease of native coronary artery without angina pectoris: Secondary | ICD-10-CM

## 2016-01-02 DIAGNOSIS — R079 Chest pain, unspecified: Secondary | ICD-10-CM | POA: Diagnosis not present

## 2016-01-02 DIAGNOSIS — Z7902 Long term (current) use of antithrombotics/antiplatelets: Secondary | ICD-10-CM | POA: Diagnosis not present

## 2016-01-02 DIAGNOSIS — I2583 Coronary atherosclerosis due to lipid rich plaque: Secondary | ICD-10-CM | POA: Diagnosis not present

## 2016-01-02 DIAGNOSIS — Z7982 Long term (current) use of aspirin: Secondary | ICD-10-CM | POA: Diagnosis not present

## 2016-01-02 DIAGNOSIS — I25110 Atherosclerotic heart disease of native coronary artery with unstable angina pectoris: Secondary | ICD-10-CM | POA: Diagnosis not present

## 2016-01-02 LAB — PULMONARY FUNCTION TEST
DL/VA % pred: 94 %
DL/VA: 4.32 ml/min/mmHg/L
DLCO COR % PRED: 76 %
DLCO UNC: 23.51 ml/min/mmHg
DLCO cor: 23.57 ml/min/mmHg
DLCO unc % pred: 75 %
FEF 25-75 Post: 2.19 L/sec
FEF 25-75 Pre: 1.86 L/sec
FEF2575-%Change-Post: 18 %
FEF2575-%Pred-Post: 72 %
FEF2575-%Pred-Pre: 61 %
FEV1-%CHANGE-POST: 4 %
FEV1-%PRED-PRE: 76 %
FEV1-%Pred-Post: 80 %
FEV1-POST: 2.86 L
FEV1-PRE: 2.73 L
FEV1FVC-%Change-Post: 2 %
FEV1FVC-%Pred-Pre: 93 %
FEV6-%Change-Post: 3 %
FEV6-%PRED-POST: 87 %
FEV6-%PRED-PRE: 84 %
FEV6-POST: 3.91 L
FEV6-Pre: 3.79 L
FEV6FVC-%CHANGE-POST: 1 %
FEV6FVC-%PRED-POST: 104 %
FEV6FVC-%PRED-PRE: 102 %
FVC-%CHANGE-POST: 1 %
FVC-%PRED-POST: 83 %
FVC-%PRED-PRE: 81 %
FVC-POST: 3.91 L
FVC-PRE: 3.84 L
POST FEV6/FVC RATIO: 100 %
PRE FEV6/FVC RATIO: 99 %
Post FEV1/FVC ratio: 73 %
Pre FEV1/FVC ratio: 71 %
RV % PRED: 83 %
RV: 1.79 L
TLC % PRED: 86 %
TLC: 5.9 L

## 2016-01-02 LAB — GLUCOSE, CAPILLARY
Glucose-Capillary: 143 mg/dL — ABNORMAL HIGH (ref 65–99)
Glucose-Capillary: 227 mg/dL — ABNORMAL HIGH (ref 65–99)

## 2016-01-02 LAB — BLOOD GAS, ARTERIAL
ACID-BASE EXCESS: 0.8 mmol/L (ref 0.0–2.0)
Bicarbonate: 24.9 mEq/L — ABNORMAL HIGH (ref 20.0–24.0)
DRAWN BY: 40415
O2 SAT: 93.4 %
PCO2 ART: 40.4 mmHg (ref 35.0–45.0)
PH ART: 7.407 (ref 7.350–7.450)
PO2 ART: 69.1 mmHg — AB (ref 80.0–100.0)
Patient temperature: 98.6
TCO2: 26.2 mmol/L (ref 0–100)

## 2016-01-02 LAB — TYPE AND SCREEN
ABO/RH(D): A POS
Antibody Screen: NEGATIVE

## 2016-01-02 LAB — LIPID PANEL
CHOL/HDL RATIO: 7.7 ratio
CHOLESTEROL: 230 mg/dL — AB (ref 0–200)
HDL: 30 mg/dL — ABNORMAL LOW (ref >40)
LDL Cholesterol: 144 mg/dL — ABNORMAL HIGH (ref 0–99)
Triglycerides: 279 mg/dL — ABNORMAL HIGH (ref <150)
VLDL: 56 mg/dL — ABNORMAL HIGH (ref 0–40)

## 2016-01-02 LAB — ABO/RH: ABO/RH(D): A POS

## 2016-01-02 MED ORDER — ALBUTEROL SULFATE (2.5 MG/3ML) 0.083% IN NEBU
2.5000 mg | INHALATION_SOLUTION | Freq: Once | RESPIRATORY_TRACT | Status: AC
  Administered 2016-01-02: 2.5 mg via RESPIRATORY_TRACT

## 2016-01-02 MED ORDER — AMLODIPINE BESYLATE 2.5 MG PO TABS: 2.5000 mg | ORAL_TABLET | Freq: Two times a day (BID) | ORAL | Status: DC

## 2016-01-02 MED ORDER — NITROGLYCERIN 0.2 MG/HR TD PT24: 0.2000 mg | MEDICATED_PATCH | Freq: Every day | TRANSDERMAL | Status: DC

## 2016-01-02 NOTE — Discharge Summary (Signed)
[name] [name], is a 60 y.o. male  DOB Mar 17, 1956  MRN [id number].  Admission date:  12/28/2015  Admitting Physician  Edwin Dada, MD  Discharge Date:  01/02/2016   Primary MD  Deloria Lair, MD  Recommendations for primary care physician for things to follow:   Monitor secondary risk factors for CAD, he is scheduled for an elective CABG on 01/09/2016   Admission Diagnosis  Chest pain, unspecified chest pain type [R07.9]   Discharge Diagnosis  Chest pain, unspecified chest pain type [R07.9]     Principal Problem:   Chest pain with high risk of acute coronary syndrome Active Problems:   Essential hypertension, benign   Obstructive sleep apnea-declines C-pap   Type 2 diabetes mellitus ([place])   TIA (transient ischemic attack)   CAD- diffuse LAD disease at cath 12/30/15   Hyperlipidemia   CAD -S/P PCI LAD OM/LAD '13 and 2016      Past Medical History  Diagnosis Date  . Coronary atherosclerosis of native coronary artery     a. LAD & OM stenting;  b. 2007 Cath: nonobs dzs, patent stents;  c. 07/2012 neg MV, EF 61%.  d. 11/20/14: [place] s/p  PCI w/ DES to pLAD and DES to OM1   . Asthma   . Essential hypertension, benign   . TIA (transient ischemic attack) ~ 2010  . Anxiety   . Hypercholesteremia   . GERD (gastroesophageal reflux disease)   . OSA on CPAP   . Type 2 diabetes mellitus ([place])   . History of hiatal hernia   . Migraine   . Arthritis   . Lumbar herniated disc   . Chronic lower back pain   . History of gout   . Thrombocytopenia ([place])   . MI, old     Past Surgical History  Procedure Laterality Date  . Anterior cervical decomp/discectomy fusion  1998    "C3-4"  . Carpal tunnel release Bilateral 2005  . Shoulder arthroscopy Left ~ 2011  . Knee surgery Left 02/2012    "scraped; open"    . Neuroplasty / transposition ulnar nerve at elbow Right ~ 2012  . Left heart catheterization with coronary angiogram N/A 07/20/2012    Procedure: LEFT HEART CATHETERIZATION WITH CORONARY ANGIOGRAM;  Surgeon: [name], MD;  Location: [facility] CATH LAB;  Service: Cardiovascular;  Laterality: N/A;  . Coronary angioplasty with stent placement  2002; 2003; 11/20/2014    "I have 4 stents after today" (11/20/2014)  . Cardiac catheterization  "several"  . Coronary angioplasty    . Left heart catheterization with coronary angiogram N/A 11/20/2014    Procedure: LEFT HEART CATHETERIZATION WITH CORONARY ANGIOGRAM;  Surgeon: [name], MD;  Location: [facility] CATH LAB;  Service: Cardiovascular;  Laterality: N/A;  . Percutaneous coronary rotoblator intervention (pci-r)  11/20/2014    Procedure: PERCUTANEOUS CORONARY ROTOBLATOR INTERVENTION (PCI-R);  Surgeon: [name], MD;  Location: [facility] CATH LAB;  Service: Cardiovascular;;  . Left heart catheterization with coronary angiogram N/A 11/26/2014  Procedure: LEFT HEART CATHETERIZATION WITH CORONARY ANGIOGRAM;  Surgeon: [name], MD;  Location: [facility] CATH LAB;  Service: Cardiovascular;  Laterality: N/A;  . Cardiac catheterization N/A 12/30/2015    Procedure: Left Heart Cath and Coronary Angiography;  Surgeon: [name], MD;  Location: [facility] CV LAB;  Service: Cardiovascular;  Laterality: N/A;       HPI  from the history and physical done on the day of admission:    [name] [name] is a 60 y.o. male with a past medical history significant for CAD s/p who presents with chest pain.  The patient's complaint started with dyspnea on exertion beginning about one week ago, associated with chest discomfort. The discomfort is located in the center chest, is vague in character, and is moderate in intensity. He was admitted for observation overnight several days ago, had normal ECG, serial troponins, and was asked to see his Cardiologist in follow up,  which is not scheduled until Mar 10.   In the meantime, the dyspnea and chest discomfort continue and were worse today. Tonight, he went in to work at [employer], and appeared pale, clammy, "blotchy", and diaphoretic to wife and co-workers. He had to rest on a cart at work, and was only able to walk a quarter of the way around the store before he had to stop and rest, so he was sent to the ER.  In the ED, the patient's initial ECG showed NSR without ST changes and troponin was negative. TRH was asked to admit for observation, serial troponins and risk stratification.  He denied fever, cough, wheezing. He has had no leg swelling, orthopnea, or PND.      Hospital Course:    1. Chest Pain in a patient with history of CAD - appears to be classic/typical in terms of symptoms, ruled out for MI, stable d-dimer, EKG was nonacute, seen by cardiology, currently on aspirin and home beta blocker, echogram was stable with preserved EF, left heart cath showed multivessel disease but with patent stent, still has exertional symptoms, multiple drug allergies which include statin, TriCor, Ranexa and Imdur.   Per cardiology cardio thoracic surgery was consulted for a possible bypass. Plavix currently on hold. Seen by Dr. Roxy Manns from cardiothoracic team, per cardiothoracic surgery patient to be discharged with outpatient follow-up for elective surgery on 01/09/2016, patient has been instructed and counseled by me personally to call 911 if he developed chest pain or any chest discomfort which is more than what he is experienced here.  2. Essential hypertension. Blood pressure stable on combination of Norvasc, ACE inhibitor, resumed home dose beta blocker.  3. GERD. On PPI continue.  4. Diabetic neuropathy. On Neurontin home dose. Will be continued unchanged.   5. DM type II. Continue home regimen follow with PCP for glycemic control and A1c monitoring.  Lab Results  Component Value Date   HGBA1C 7.9* 12/29/2015     CBG (last 3)   Recent Labs  01/01/16 1619 01/01/16 2127 01/02/16 0734  GLUCAP 307* 202* 143*       Discharge Condition: Fair  Follow UP  Follow-up Information    Follow up with TAPPER,DAVID B, MD. Schedule an appointment as soon as possible for a visit in 1 week.   Specialty:  Family Medicine   Contact information:   [place] [address] [phone]       Follow up with Quay Burow, MD. Schedule an appointment as soon as possible for a visit  in 1 week.   Specialties:  Cardiology, Radiology   Contact information:   [address] [place] [address] [phone]       Follow up with Rexene Alberts, MD. Schedule an appointment as soon as possible for a visit in 1 week.   Specialty:  Cardiothoracic Surgery   Contact information:   [address] [place] [address] [phone]        Consults obtained - Cardiology, cardiothoracic surgery  Diet and Activity recommendation: See Discharge Instructions below  Discharge Instructions           Discharge Instructions    Discharge instructions    Complete by:  As directed   Do not drive, operate heavy machinery, overexert to you have your open-heart surgery done.  Follow with Primary MD TAPPER,DAVID B, MD in 7 days   Get CBC, CMP, 2 view Chest X ray checked  by Primary MD next visit.    Activity: As tolerated with Full fall precautions use walker/cane & assistance as needed   Disposition Home    Diet:   Heart Healthy Low Carb  .  Accuchecks 4 times/day, Once in AM empty stomach and then before each meal. Log in all results and show them to your Prim.MD in 3 days. If any glucose reading is under 80 or above 300 call your Prim MD immidiately. Follow Low glucose instructions for glucose under 80 as instructed.  For Heart failure patients - Check your Weight same time everyday, if you gain over 2 pounds, or you develop in leg swelling, experience  more shortness of breath or chest pain, call your Primary MD immediately. Follow Cardiac Low Salt Diet and 1.5 lit/day fluid restriction.   On your next visit with your primary care physician please Get Medicines reviewed and adjusted.   Please request your Prim.MD to go over all Hospital Tests and Procedure/Radiological results at the follow up, please get all Hospital records sent to your Prim MD by signing hospital release before you go home.   If you experience worsening of your admission symptoms, develop shortness of breath, life threatening emergency, suicidal or homicidal thoughts you must seek medical attention immediately by calling 911 or calling your MD immediately  if symptoms less severe.  You Must read complete instructions/literature along with all the possible adverse reactions/side effects for all the Medicines you take and that have been prescribed to you. Take any new Medicines after you have completely understood and accpet all the possible adverse reactions/side effects.   Do not drive, operating heavy machinery, perform activities at heights, swimming or participation in water activities or provide baby sitting services if your were admitted for syncope or siezures until you have seen by Primary MD or a Neurologist and advised to do so again.  Do not drive when taking Pain medications.    Do not take more than prescribed Pain, Sleep and Anxiety Medications  Special Instructions: If you have smoked or chewed Tobacco  in the last 2 yrs please stop smoking, stop any regular Alcohol  and or any Recreational drug use.  Wear Seat belts while driving.   Please note  You were cared for by a hospitalist during your hospital stay. If you have any questions about your discharge medications or the care you received while you were in the hospital after you are discharged, you can call the unit and asked to speak with the hospitalist on call if the hospitalist that took  care of  you is not available. Once you are discharged, your primary care physician will handle any further medical issues. Please note that NO REFILLS for any discharge medications will be authorized once you are discharged, as it is imperative that you return to your primary care physician (or establish a relationship with a primary care physician if you do not have one) for your aftercare needs so that they can reassess your need for medications and monitor your lab values.     Increase activity slowly    Complete by:  As directed              Discharge Medications       Medication List    STOP taking these medications        clopidogrel 75 MG tablet  Commonly known as:  PLAVIX     ibuprofen 200 MG tablet  Commonly known as:  ADVIL,MOTRIN      TAKE these medications        acetaminophen 500 MG tablet  Commonly known as:  TYLENOL  Take 1,000 mg by mouth every 6 (six) hours as needed (pain).     albuterol 108 (90 Base) MCG/ACT inhaler  Commonly known as:  PROVENTIL HFA;VENTOLIN HFA  Inhale 2 puffs into the lungs every 6 (six) hours as needed for wheezing or shortness of breath.     albuterol (2.5 MG/3ML) 0.083% nebulizer solution  Commonly known as:  PROVENTIL  Take 2.5 mg by nebulization every 6 (six) hours as needed for wheezing or shortness of breath.     amLODipine 2.5 MG tablet  Commonly known as:  NORVASC  Take 1 tablet (2.5 mg total) by mouth 2 (two) times daily.     aspirin EC 81 MG tablet  Take 81 mg by mouth at bedtime.     citalopram 20 MG tablet  Commonly known as:  CELEXA  Take 20 mg by mouth at bedtime.     fluticasone 50 MCG/ACT nasal spray  Commonly known as:  FLONASE  Place 1 spray into the nose 2 (two) times daily.     Fluticasone-Salmeterol 250-50 MCG/DOSE Aepb  Commonly known as:  ADVAIR  Inhale 1 puff into the lungs every 12 (twelve) hours.     gabapentin 400 MG capsule  Commonly known as:  NEURONTIN  Take 400 mg by mouth 3 (three) times daily.       LANTUS SOLOSTAR 100 UNIT/ML Solostar Pen  Generic drug:  Insulin Glargine  Inject 60 Units into the skin 2 (two) times daily.     Melatonin 5 MG Tabs  Take 10 mg by mouth at bedtime.     metFORMIN 1000 MG tablet  Commonly known as:  GLUCOPHAGE  Take 1 tablet (1,000 mg total) by mouth 2 (two) times daily.     metoprolol 50 MG tablet  Commonly known as:  LOPRESSOR  Take 1 tablet (50 mg total) by mouth daily.     nitroGLYCERIN 0.4 MG SL tablet  Commonly known as:  NITROSTAT  Place 0.4 mg under the tongue every 5 (five) minutes as needed for chest pain.     nitroGLYCERIN 0.2 mg/hr patch  Commonly known as:  NITRODUR - Dosed in mg/24 hr  Place 1 patch (0.2 mg total) onto the skin daily.     NOVOLOG FLEXPEN 100 UNIT/ML FlexPen  Generic drug:  insulin aspart  Inject 1-15 Units into the skin 3 (three) times daily as needed for high blood sugar (CBG >120). Per  sliding scale written down at home     omeprazole 20 MG capsule  Commonly known as:  PRILOSEC  Take 20 mg by mouth 2 (two) times daily before a meal.     ramipril 5 MG tablet  Commonly known as:  ALTACE  Take 5 mg by mouth at bedtime.     terbinafine 250 MG tablet  Commonly known as:  LAMISIL  Take 250 mg by mouth at bedtime. For toe fungus        Major procedures and Radiology Reports - PLEASE review detailed and final reports for all details, in brief -   L Heart Cath  History obtained from chart review. [name] [name] is 60 y/o Caucasian male with a history of CAD. He had cardiac catheterization performed January 2015 with PCI and stenting of his LAD and LCX-OM branch by Dr [name]. He was admitted with Unstable angina. He presents now for cath and potential intervention.  IMPRESSION :[name] [name] has patent stent with progression of his mid-distal LAD in a 1.5-1.75mm vessel. Circumflex is unchanged from his prior angiogram. Continued medical Rx will be recommended. The sheath was removed and a TR band on the right  wrist to achieve patent hemostasis.  Quay Burow. MD, [name] 12/30/2015 1:07 PM   TTE  Left ventricle: The cavity size was normal. Wall thickness wasincreased in a pattern of mild LVH. Systolic function was normal. The estimated ejection fraction was in the range of 60% to 65%.Wall motion was normal; there were no regional wall motion abnormalities. Doppler parameters are consistent with abnormal left ventricular relaxation (grade 1 diastolic dysfunction). - Aortic valve: There was mild to moderate regurgitation. Valvearea (VTI): 2.47 cm^2. Valve area (Vmax): 2.43 cm^2. - Left atrium: The atrium was moderately dilated. - Technically difficult study. Echocontrast was used to enhance visualization.   Dg Chest 2 View  01/02/2016  CLINICAL DATA:  Chest pain EXAM: CHEST  2 VIEW COMPARISON:  12/28/2015 FINDINGS: Heart size normal. Left coronary stent noted. Negative for heart failure. Lungs are clear without infiltrate effusion or mass. IMPRESSION: No active cardiopulmonary disease. Electronically Signed   By: Franchot Gallo M.D.   On: 01/02/2016 07:47   Dg Chest 2 View  12/23/2015  CLINICAL DATA:  60 year old male with left-sided chest pain since earlier this morning EXAM: CHEST  2 VIEW COMPARISON:  Prior chest x-ray 12/03/2014 FINDINGS: The lungs are clear and negative for focal airspace consolidation, pulmonary edema or suspicious pulmonary nodule. No pleural effusion or pneumothorax. Cardiac and mediastinal contours are within normal limits. Metallic stent projects the left heart. No acute fracture or lytic or blastic osseous lesions. The visualized upper abdominal bowel gas pattern is unremarkable. IMPRESSION: No active cardiopulmonary disease. Electronically Signed   By: Jacqulynn Cadet M.D.   On: 12/23/2015 18:46   Dg Chest Port 1 View  12/28/2015  CLINICAL DATA:  Shortness of breath with chest pressure. Lightheadedness today. EXAM: PORTABLE CHEST 1 VIEW COMPARISON:  Frontal and  lateral views 12/23/2015 FINDINGS: The cardiomediastinal contours are normal. Cardiac stent in place. The lungs are clear. Pulmonary vasculature is normal. No consolidation, pleural effusion, or pneumothorax. No acute osseous abnormalities are seen. IMPRESSION: No acute pulmonary process. Electronically Signed   By: Jeb Levering M.D.   On: 12/28/2015 20:08    Micro Results      No results found for this or any previous visit (from the past 240 hour(s)).     Today   Subjective    [name] [name] today  has no headache,no chest abdominal pain,no new weakness tingling or numbness, feels much better wants to go home today.     Objective   Blood pressure 128/45, pulse 79, temperature 98.5 F (36.9 C), temperature source Oral, resp. rate 19, height 5' 9"  (1.753 m), weight 102.785 kg (226 lb 9.6 oz), SpO2 96 %.   Intake/Output Summary (Last 24 hours) at 01/02/16 1124 Last data filed at 01/02/16 0925  Gross per 24 hour  Intake   1080 ml  Output   3550 ml  Net  -2470 ml    Exam Awake Alert, Oriented x 3, No new F.N deficits, Normal affect [place].AT,PERRAL Supple Neck,No JVD, No cervical lymphadenopathy appriciated.  Symmetrical Chest wall movement, Good air movement bilaterally, CTAB RRR,No Gallops,Rubs or new Murmurs, No Parasternal Heave +ve B.Sounds, Abd Soft, Non tender, No organomegaly appriciated, No rebound -guarding or rigidity. No Cyanosis, Clubbing or edema, No new Rash or bruise   Data Review   CBC w Diff:  Lab Results  Component Value Date   WBC 3.8* 12/31/2015   HGB 14.5 12/31/2015   HCT 40.7 12/31/2015   PLT 102* 12/31/2015   LYMPHOPCT 35 10/29/2015   MONOPCT 6 10/29/2015   EOSPCT 3 10/29/2015   BASOPCT 0 10/29/2015    CMP:  Lab Results  Component Value Date   NA 140 12/31/2015   K 3.9 12/31/2015   CL 105 12/31/2015   CO2 26 12/31/2015   BUN 20 12/31/2015   CREATININE 1.24 12/31/2015   PROT 6.5 12/28/2015   ALBUMIN 3.5 12/28/2015   BILITOT 0.7  12/28/2015   ALKPHOS 40 12/28/2015   AST 33 12/28/2015   ALT 43 12/28/2015  .   Total Time in preparing paper work, data evaluation and todays exam - 35 minutes  Thurnell Lose M.D on 01/02/2016 at 11:24 AM  [facility]   Office  [phone]

## 2016-01-02 NOTE — Progress Notes (Addendum)
       [address]       [place] [address]             [phone]     CARDIOTHORACIC SURGERY PROGRESS NOTE  3 Days Post-Op  S/P Procedure(s) (LRB): Left Heart Cath and Coronary Angiography (N/A)  Subjective: Doing well.  No chest pain last 24 hours  Objective: Vital signs in last 24 hours: Temp:  [98.5 F (36.9 C)-98.9 F (37.2 C)] 98.5 F (36.9 C) (02/23 0839) Pulse Rate:  [65-73] 73 (02/23 0839) Cardiac Rhythm:  [-] Normal sinus rhythm (02/23 0700) Resp:  [15-20] 19 (02/23 0720) BP: (114-156)/(61-76) 115/61 mmHg (02/23 0839) SpO2:  [95 %-97 %] 95 % (02/23 0839) Weight:  [226 lb 9.6 oz (102.785 kg)] 226 lb 9.6 oz (102.785 kg) (02/23 0400)  Physical Exam:  Rhythm:   sinus  Breath sounds: clear  Heart sounds:  RRR  Incisions:  n/a  Abdomen:  soft  Extremities:  warm   Intake/Output from previous day: 02/22 0701 - 02/23 0700 In: 1080 [P.O.:1080] Out: 3050 [Urine:3050] Intake/Output this shift: Total I/O In: -  Out: 350 [Urine:350]  Lab Results:  Recent Labs  12/31/15 0457  WBC 3.8*  HGB 14.5  HCT 40.7  PLT 102*   BMET:  Recent Labs  12/31/15 0457  NA 140  K 3.9  CL 105  CO2 26  GLUCOSE 124*  BUN 20  CREATININE 1.24  CALCIUM 9.0    CBG (last 3)   Recent Labs  01/01/16 1619 01/01/16 2127 01/02/16 0734  GLUCAP 307* 202* 143*   PT/INR:  No results for input(s): LABPROT, INR in the last 72 hours.  CXR:  CHEST 2 VIEW  COMPARISON: 12/28/2015  FINDINGS: Heart size normal. Left coronary stent noted. Negative for heart failure. Lungs are clear without infiltrate effusion or mass.  IMPRESSION: No active cardiopulmonary disease.   Electronically Signed  By: Franchot Gallo M.D.  On: 01/02/2016 07:47 Assessment/Plan: S/P Procedure(s) (LRB): Left Heart Cath and Coronary Angiography (N/A)  Clinically stable I agree w/ plans for hospital d/c and return next week for CABG  I have reviewed the indications,  risks, and potential benefits of coronary artery bypass grafting with the patient and his wife.  Alternative treatment strategies have been discussed, including the relative risks, benefits and long term prognosis associated with medical therapy, percutaneous coronary intervention, and surgical revascularization.  The patient understands and accepts all potential associated risks of surgery including but not limited to risk of death, stroke or other neurologic complication, myocardial infarction, congestive heart failure, respiratory failure, renal failure, bleeding requiring blood transfusion and/or reexploration, aortic dissection or other major vascular complication, arrhythmia, heart block or bradycardia requiring permanent pacemaker, pneumonia, pleural effusion, wound infection, pulmonary embolus or other thromboembolic complication, chronic pain or other delayed complications related to median sternotomy, or the late recurrence of symptomatic ischemic heart disease and/or congestive heart failure.  The importance of long term risk modification have been emphasized.  All questions answered.  I spent in excess of 15 minutes during the conduct of this hospital encounter and >50% of this time involved direct face-to-face encounter with the patient for counseling and/or coordination of their care.   Rexene Alberts, MD 01/02/2016 9:06 AM

## 2016-01-02 NOTE — Progress Notes (Signed)
Report received in patient's room via Rey RN using [employer], updated on VS, meds, tests, labs, new orders and patient's general condition, assumed care of patient.

## 2016-01-02 NOTE — Plan of Care (Signed)
Problem: Pain Managment: Goal: General experience of comfort will improve Outcome: Completed/Met Date Met:  01/02/16 Patient is able to express his pain, how intense it is, if it radiates and if the pain meds relieve the pain. Patient is able to verbalize understanding of the pain scale and when he can have his medications.

## 2016-01-02 NOTE — Progress Notes (Signed)
Patient discharge instructions gone over with patient in detail. All questions answered to patients satisfaction. IV removed and tele discontinued.Patient discharged home with wife by way of wheel chair.

## 2016-01-02 NOTE — Progress Notes (Signed)
Patient Name:  [name] [name], DOB: [birth year], MRN: [id number] Primary Doctor: Deloria Lair, MD Primary Cardiologist:   Date: 01/02/2016   SUBJECTIVE: The patient is stable. He was seen again yesterday by Dr. Roxy Manns. I have spoken again with Dr.Owen. We both agree that it will be safe for the patient to go home between now and the day of his surgery on January 09, 2016.   Past Medical History  Diagnosis Date  . Coronary atherosclerosis of native coronary artery     a. LAD & OM stenting;  b. 2007 Cath: nonobs dzs, patent stents;  c. 07/2012 neg MV, EF 61%.  d. 11/20/14: [place] s/p  PCI w/ DES to pLAD and DES to OM1   . Asthma   . Essential hypertension, benign   . TIA (transient ischemic attack) ~ 2010  . Anxiety   . Hypercholesteremia   . GERD (gastroesophageal reflux disease)   . OSA on CPAP   . Type 2 diabetes mellitus ([place])   . History of hiatal hernia   . Migraine   . Arthritis   . Lumbar herniated disc   . Chronic lower back pain   . History of gout   . Thrombocytopenia ([place])   . MI, old    Filed Vitals:   01/01/16 2120 01/01/16 2208 01/02/16 0400 01/02/16 0720  BP: 156/76 156/76 114/65   Pulse: 67  65 67  Temp: 98.9 F (37.2 C)  98.9 F (37.2 C)   TempSrc: Oral  Oral   Resp: 18  20 19   Height:      Weight:   226 lb 9.6 oz (102.785 kg)   SpO2: 96%  96% 96%    Intake/Output Summary (Last 24 hours) at 01/02/16 0725 Last data filed at 01/02/16 0500  Gross per 24 hour  Intake   1080 ml  Output   3050 ml  Net  -1970 ml   Filed Weights   12/31/15 0508 01/01/16 0345 01/02/16 0400  Weight: 231 lb 4.8 oz (104.917 kg) 229 lb 9.6 oz (104.146 kg) 226 lb 9.6 oz (102.785 kg)     LABS: Basic Metabolic Panel:  Recent Labs  12/31/15 0457  NA 140  K 3.9  CL 105  CO2 26  GLUCOSE 124*  BUN 20  CREATININE 1.24  CALCIUM 9.0   Liver Function Tests: No results for input(s): AST, ALT, ALKPHOS, BILITOT, PROT, ALBUMIN in the last 72 hours. No results for  input(s): LIPASE, AMYLASE in the last 72 hours. CBC:  Recent Labs  12/31/15 0457  WBC 3.8*  HGB 14.5  HCT 40.7  MCV 90.8  PLT 102*   Cardiac Enzymes: No results for input(s): CKTOTAL, CKMB, CKMBINDEX, TROPONINI in the last 72 hours. BNP: Invalid input(s): POCBNP D-Dimer: No results for input(s): DDIMER in the last 72 hours. Thyroid Function Tests: No results for input(s): TSH, T4TOTAL, T3FREE, THYROIDAB in the last 72 hours.  Invalid input(s): FREET3  RADIOLOGY: Dg Chest 2 View  12/23/2015  CLINICAL DATA:  60 year old male with left-sided chest pain since earlier this morning EXAM: CHEST  2 VIEW COMPARISON:  Prior chest x-ray 12/03/2014 FINDINGS: The lungs are clear and negative for focal airspace consolidation, pulmonary edema or suspicious pulmonary nodule. No pleural effusion or pneumothorax. Cardiac and mediastinal contours are within normal limits. Metallic stent projects the left heart. No acute fracture or lytic or blastic osseous lesions. The visualized upper abdominal bowel gas pattern is unremarkable. IMPRESSION: No active cardiopulmonary disease. Electronically  Signed   By: Jacqulynn Cadet M.D.   On: 12/23/2015 18:46   Dg Chest Port 1 View  12/28/2015  CLINICAL DATA:  Shortness of breath with chest pressure. Lightheadedness today. EXAM: PORTABLE CHEST 1 VIEW COMPARISON:  Frontal and lateral views 12/23/2015 FINDINGS: The cardiomediastinal contours are normal. Cardiac stent in place. The lungs are clear. Pulmonary vasculature is normal. No consolidation, pleural effusion, or pneumothorax. No acute osseous abnormalities are seen. IMPRESSION: No acute pulmonary process. Electronically Signed   By: Jeb Levering M.D.   On: 12/28/2015 20:08    PHYSICAL EXAM  The patient is oriented to person time and place. Affect is normal. Lungs are clear. Respiratory effort is not labored. Cardiac exam reveals an S1 and S2. The abdomen is soft. There is no peripheral  edema.   TELEMETRY: I have personally reviewed telemetry today January 02, 2016. There is normal sinus rhythm.   ASSESSMENT AND PLAN:    Chest pain with high risk of acute coronary syndrome     The patient is improved. After very careful evaluation decision has been made to proceed with bypass surgery on January 09, 2016. The patient is stable for discharge today. We will await further input from Dr. Roxy Manns to be sure that he is in agreement with the final plan. If so the patient will be discharged during the day today. It is specifically noted that the patient will go home OFF PLAVIX in preparation for his upcoming surgery. He'll be given a nitroglycerin prescription with further instructions to use it if he has prolonged pain.    Obstructive sleep apnea-declines C-pap    Type 2 diabetes mellitus ([place])     Patient's diabetes is treated and under control.   TIA (transient ischemic attack)     Dola Argyle 01/02/2016 7:26 AM

## 2016-01-02 NOTE — Progress Notes (Signed)
Patient and his whole family viewed videos on open heart surgery and how to care for himself after discharge. He also has the booklet about open heart surgery and we reviewed some of the highlights and he demonstrated how to use his incentive spirometer and all questions were answered. Patient and family seem satisfied with the information received and the questions answered.

## 2016-01-02 NOTE — Discharge Instructions (Signed)
Do not drive, operate heavy machinery, overexert to you have your open-heart surgery done.   Follow with Primary MD TAPPER,DAVID B, MD in 7 days   Get CBC, CMP, 2 view Chest X ray checked  by Primary MD next visit.    Activity: As tolerated with Full fall precautions use walker/cane & assistance as needed   Disposition Home    Diet:   Heart Healthy Low Carb  .  For Heart failure patients - Check your Weight same time everyday, if you gain over 2 pounds, or you develop in leg swelling, experience more shortness of breath or chest pain, call your Primary MD immediately. Follow Cardiac Low Salt Diet and 1.5 lit/day fluid restriction.  Accuchecks 4 times/day, Once in AM empty stomach and then before each meal. Log in all results and show them to your Prim.MD in 3 days. If any glucose reading is under 80 or above 300 call your Prim MD immidiately. Follow Low glucose instructions for glucose under 80 as instructed.   On your next visit with your primary care physician please Get Medicines reviewed and adjusted.   Please request your Prim.MD to go over all Hospital Tests and Procedure/Radiological results at the follow up, please get all Hospital records sent to your Prim MD by signing hospital release before you go home.   If you experience worsening of your admission symptoms, develop shortness of breath, life threatening emergency, suicidal or homicidal thoughts you must seek medical attention immediately by calling 911 or calling your MD immediately  if symptoms less severe.  You Must read complete instructions/literature along with all the possible adverse reactions/side effects for all the Medicines you take and that have been prescribed to you. Take any new Medicines after you have completely understood and accpet all the possible adverse reactions/side effects.   Do not drive, operating heavy machinery, perform activities at heights, swimming or participation in water activities or  provide baby sitting services if your were admitted for syncope or siezures until you have seen by Primary MD or a Neurologist and advised to do so again.  Do not drive when taking Pain medications.    Do not take more than prescribed Pain, Sleep and Anxiety Medications  Special Instructions: If you have smoked or chewed Tobacco  in the last 2 yrs please stop smoking, stop any regular Alcohol  and or any Recreational drug use.  Wear Seat belts while driving.   Please note  You were cared for by a hospitalist during your hospital stay. If you have any questions about your discharge medications or the care you received while you were in the hospital after you are discharged, you can call the unit and asked to speak with the hospitalist on call if the hospitalist that took care of you is not available. Once you are discharged, your primary care physician will handle any further medical issues. Please note that NO REFILLS for any discharge medications will be authorized once you are discharged, as it is imperative that you return to your primary care physician (or establish a relationship with a primary care physician if you do not have one) for your aftercare needs so that they can reassess your need for medications and monitor your lab values.

## 2016-01-02 NOTE — Plan of Care (Signed)
Problem: Safety: Goal: Ability to remain free from injury will improve Outcome: Completed/Met Date Met:  01/02/16 Patient uses call light or calls his nurse/tech on their phones as instructed

## 2016-01-07 ENCOUNTER — Encounter (HOSPITAL_COMMUNITY): Payer: Self-pay

## 2016-01-07 ENCOUNTER — Encounter (HOSPITAL_COMMUNITY)
Admit: 2016-01-07 | Discharge: 2016-01-07 | Disposition: A | Discharge status: Home / Self Care | Payer: Medicare Other | Attending: Thoracic Surgery (Cardiothoracic Vascular Surgery) | Admitting: Thoracic Surgery (Cardiothoracic Vascular Surgery)

## 2016-01-07 ENCOUNTER — Ambulatory Visit (HOSPITAL_BASED_OUTPATIENT_CLINIC_OR_DEPARTMENT_OTHER)
Admit: 2016-01-07 | Discharge: 2016-01-07 | Disposition: A | Discharge status: Home / Self Care | Payer: Medicare Other | Source: Ambulatory Visit | Attending: Thoracic Surgery (Cardiothoracic Vascular Surgery) | Admitting: Thoracic Surgery (Cardiothoracic Vascular Surgery)

## 2016-01-07 VITALS — BP 124/61 | HR 82 | Temp 98.2°F | Resp 18 | Ht 69.5 in | Wt 229.2 lb

## 2016-01-07 DIAGNOSIS — Z01818 Encounter for other preprocedural examination: Secondary | ICD-10-CM | POA: Insufficient documentation

## 2016-01-07 DIAGNOSIS — I2510 Atherosclerotic heart disease of native coronary artery without angina pectoris: Secondary | ICD-10-CM

## 2016-01-07 DIAGNOSIS — E7800 Pure hypercholesterolemia, unspecified: Secondary | ICD-10-CM | POA: Insufficient documentation

## 2016-01-07 DIAGNOSIS — I10 Essential (primary) hypertension: Secondary | ICD-10-CM

## 2016-01-07 DIAGNOSIS — I252 Old myocardial infarction: Secondary | ICD-10-CM

## 2016-01-07 DIAGNOSIS — Z0183 Encounter for blood typing: Secondary | ICD-10-CM | POA: Insufficient documentation

## 2016-01-07 DIAGNOSIS — Z79899 Other long term (current) drug therapy: Secondary | ICD-10-CM | POA: Insufficient documentation

## 2016-01-07 DIAGNOSIS — Z01812 Encounter for preprocedural laboratory examination: Secondary | ICD-10-CM | POA: Insufficient documentation

## 2016-01-07 DIAGNOSIS — Z8673 Personal history of transient ischemic attack (TIA), and cerebral infarction without residual deficits: Secondary | ICD-10-CM

## 2016-01-07 DIAGNOSIS — Z7982 Long term (current) use of aspirin: Secondary | ICD-10-CM | POA: Insufficient documentation

## 2016-01-07 DIAGNOSIS — Z794 Long term (current) use of insulin: Secondary | ICD-10-CM

## 2016-01-07 DIAGNOSIS — E119 Type 2 diabetes mellitus without complications: Secondary | ICD-10-CM

## 2016-01-07 DIAGNOSIS — Z8249 Family history of ischemic heart disease and other diseases of the circulatory system: Secondary | ICD-10-CM

## 2016-01-07 HISTORY — DX: Unspecified cataract: H26.9

## 2016-01-07 HISTORY — DX: Unspecified hearing loss, left ear: H91.92

## 2016-01-07 LAB — COMPREHENSIVE METABOLIC PANEL
ALT: 63 U/L (ref 17–63)
AST: 39 U/L (ref 15–41)
Albumin: 3.8 g/dL (ref 3.5–5.0)
Alkaline Phosphatase: 46 U/L (ref 38–126)
Anion gap: 10 (ref 5–15)
BILIRUBIN TOTAL: 0.7 mg/dL (ref 0.3–1.2)
BUN: 16 mg/dL (ref 6–20)
CHLORIDE: 101 mmol/L (ref 101–111)
CO2: 26 mmol/L (ref 22–32)
CREATININE: 1.19 mg/dL (ref 0.61–1.24)
Calcium: 9.6 mg/dL (ref 8.9–10.3)
Glucose, Bld: 198 mg/dL — ABNORMAL HIGH (ref 65–99)
POTASSIUM: 4.5 mmol/L (ref 3.5–5.1)
Sodium: 137 mmol/L (ref 135–145)
TOTAL PROTEIN: 7.4 g/dL (ref 6.5–8.1)

## 2016-01-07 LAB — BLOOD GAS, ARTERIAL
ACID-BASE DEFICIT: 0.4 mmol/L (ref 0.0–2.0)
BICARBONATE: 22.6 meq/L (ref 20.0–24.0)
Drawn by: 449841
FIO2: 0.21
O2 Saturation: 98.6 %
PCO2 ART: 30.1 mmHg — AB (ref 35.0–45.0)
PO2 ART: 113 mmHg — AB (ref 80.0–100.0)
Patient temperature: 98.6
TCO2: 23.5 mmol/L (ref 0–100)
pH, Arterial: 7.489 — ABNORMAL HIGH (ref 7.350–7.450)

## 2016-01-07 LAB — APTT: APTT: 31 s (ref 24–37)

## 2016-01-07 LAB — CBC
HEMATOCRIT: 44.2 % (ref 39.0–52.0)
Hemoglobin: 16.1 g/dL (ref 13.0–17.0)
MCH: 32.4 pg (ref 26.0–34.0)
MCHC: 36.4 g/dL — ABNORMAL HIGH (ref 30.0–36.0)
MCV: 88.9 fL (ref 78.0–100.0)
PLATELETS: 128 10*3/uL — AB (ref 150–400)
RBC: 4.97 MIL/uL (ref 4.22–5.81)
RDW: 12.7 % (ref 11.5–15.5)
WBC: 4.7 10*3/uL (ref 4.0–10.5)

## 2016-01-07 LAB — URINALYSIS, ROUTINE W REFLEX MICROSCOPIC
BILIRUBIN URINE: NEGATIVE
Glucose, UA: NEGATIVE mg/dL
HGB URINE DIPSTICK: NEGATIVE
Ketones, ur: NEGATIVE mg/dL
LEUKOCYTES UA: NEGATIVE
NITRITE: NEGATIVE
Protein, ur: NEGATIVE mg/dL
SPECIFIC GRAVITY, URINE: 1.019 (ref 1.005–1.030)
pH: 5 (ref 5.0–8.0)

## 2016-01-07 LAB — SURGICAL PCR SCREEN
MRSA, PCR: NEGATIVE
STAPHYLOCOCCUS AUREUS: POSITIVE — AB

## 2016-01-07 LAB — TYPE AND SCREEN
ABO/RH(D): A POS
Antibody Screen: NEGATIVE

## 2016-01-07 LAB — PROTIME-INR
INR: 1.16 (ref 0.00–1.49)
PROTHROMBIN TIME: 15 s (ref 11.6–15.2)

## 2016-01-07 LAB — GLUCOSE, CAPILLARY: Glucose-Capillary: 218 mg/dL — ABNORMAL HIGH (ref 65–99)

## 2016-01-07 NOTE — Pre-Procedure Instructions (Addendum)
[name] [name]  01/07/2016      [facility] - [place], [place] - [place] [address] Phone: [phone] Fax: [phone]    Your procedure is scheduled on Thursday, March 2nd, 2017.  Report to [facility] Admitting at 5:30 A.M.  Call this number if you have problems the morning of surgery:  [phone]   Remember:  Do not eat food or drink liquids after midnight.   Take these medicines the morning of surgery with A SIP OF WATER: Acetaminophen (Tylenol) if needed, Albuterol inhaler if needed (please bring with you), Albuterol Nebulizer if needed, Amlodipine (Norvasc), Fluticasone (Flonase), Advair, Gabapentin (Neurontin), Omeprazole (Prilosec), Metoprolol (Lopressor)   What do I do about my diabetes medications?   Do not take oral diabetes medicines (pills) the morning of surgery.  Do NOT take Metformin the morning of surgery    THE NIGHT BEFORE SURGERY, take 48 units of Lantus Insulin.   THE MORNING OF SURGERY, take 30 units of Lantus Insulin.   Do not take other diabetes injectables the day of surgery including Byetta, Victoza, Bydureon, and Trulicity.   If your CBG is greater than 220 mg/dL, you may take 1/2 of your sliding scale (correction) dose of insulin.  Stop taking: Aspirin, NSAIDS, Aleve, Naproxen, Ibuprofen, Advil, Motrin, BC's, Goody's, Fish Oil, all herbal medications, and all vitamins.    Do not wear jewelry.  Do not wear lotions, powders, or colognes.  You may NOT wear deodorant.  Men may shave face and neck.  Do not bring valuables to the hospital.   [facility] is not responsible for any belongings or valuables.  Contacts, dentures or bridgework may not be worn into surgery.  Leave your suitcase in the car.  After surgery it may be brought to your room.  For patients admitted to the hospital, discharge time will be determined by your treatment team.  Patients discharged the day of surgery will not be  allowed to drive home.   Special instructions:  See attached.   Please read over the following fact sheets that you were given. Pain Booklet, Coughing and Deep Breathing, Blood Transfusion Information, MRSA Information and Surgical Site Infection Prevention    How to Manage Your Diabetes Before Surgery   Why is it important to control my blood sugar before and after surgery?   Improving blood sugar levels before and after surgery helps healing and can limit problems.  A way of improving blood sugar control is eating a healthy diet by:  - Eating less sugar and carbohydrates  - Increasing activity/exercise  - Talk with your doctor about reaching your blood sugar goals  High blood sugars (greater than 180 mg/dL) can raise your risk of infections and slow down your recovery so you will need to focus on controlling your diabetes during the weeks before surgery.  Make sure that the doctor who takes care of your diabetes knows about your planned surgery including the date and location.  How do I manage my blood sugars before surgery?   Check your blood sugar at least 4 times a day, 2 days before surgery to make sure that they are not too high or low.   Check your blood sugar the morning of your surgery when you wake up and every 2 hours until you get to the Short-Stay unit.  If your blood sugar is less than 70 mg/dL, you will need to treat for low blood sugar  by:  Treat a low blood sugar (less than 70 mg/dL) with 1/2 cup of clear juice (cranberry or apple), 4 glucose tablets, OR glucose gel.  Recheck blood sugar in 15 minutes after treatment (to make sure it is greater than 70 mg/dL).  If blood sugar is not greater than 70 mg/dL on re-check, call [phone] for further instructions.   Report your blood sugar to the Short-Stay nurse when you get to Short-Stay.  References:  [facility], 2007 "How to Manage your Diabetes Before and After  Surgery".

## 2016-01-07 NOTE — Progress Notes (Signed)
Pre-op Cardiac Surgery  Carotid Findings:  There is no obvious evidence of hemodynamically significant internal carotid artery stenosis bilaterally. Vertebral arteries are patent with antegrade flow.  Upper Extremity Right Left  Brachial Pressures 135-Triphasic 120-Triphasic  Radial Waveforms Triphasic Triphasic  Ulnar Waveforms Triphasic Triphasic  Palmar Arch (Allen's Test) Within normal limits Within normal limits.   Lower  Extremity Right Left  Dorsalis Pedis 168-Triphasic 157-Triphasic  Anterior Tibial    Posterior Tibial 149-Triphasic 140-Triphasic  Ankle/Brachial Indices 1.24 1.16    Findings:   Bilateral ABIs are within normal limits.  01/07/2016 12:06 PM Maudry Mayhew, RVT, RDCS, RDMS

## 2016-01-07 NOTE — Progress Notes (Signed)
PCP - Dr. Matthias Hughs Cardiologist - Dr. Domenic Polite  EKG - 12/29/15 CXR - 01/02/16 Echo - 12/29/15 Stress test - 11/2013 Cath - 12/30/2015  Patient denies chest pain and shortness of breath.  Patient states that he has a slight headache due to the nitro patch.    Patient states that he checks his blood sugar 3-4 times a day and that his fasting blood sugar is 119.

## 2016-01-08 LAB — HEMOGLOBIN A1C
Hgb A1c MFr Bld: 8.3 % — ABNORMAL HIGH (ref 4.8–5.6)
MEAN PLASMA GLUCOSE: 192 mg/dL

## 2016-01-08 MED ORDER — CHLORHEXIDINE GLUCONATE 4 % EX LIQD: 30.0000 mL | CUTANEOUS | Status: DC

## 2016-01-08 MED ORDER — CHLORHEXIDINE GLUCONATE 0.12 % MT SOLN
15.0000 mL | Freq: Once | OROMUCOSAL | Status: AC
  Administered 2016-01-09: 15 mL via OROMUCOSAL
  Filled 2016-01-08: qty 15

## 2016-01-08 MED ORDER — METOPROLOL TARTRATE 12.5 MG HALF TABLET: 12.5000 mg | ORAL_TABLET | Freq: Once | ORAL | Status: DC

## 2016-01-08 MED ORDER — DEXTROSE 5 % IV SOLN
750.0000 mg | INTRAVENOUS | Status: DC
  Filled 2016-01-08: qty 750

## 2016-01-08 MED ORDER — SODIUM CHLORIDE 0.9 % IV SOLN
INTRAVENOUS | Status: DC
  Filled 2016-01-08: qty 30

## 2016-01-08 MED ORDER — DEXTROSE 5 % IV SOLN
1.5000 g | INTRAVENOUS | Status: AC
  Administered 2016-01-09: 1.5 g via INTRAVENOUS
  Administered 2016-01-09: .75 g via INTRAVENOUS
  Filled 2016-01-08 (×2): qty 1.5

## 2016-01-08 MED ORDER — PLASMA-LYTE 148 IV SOLN
INTRAVENOUS | Status: AC
  Administered 2016-01-09: 500 mL
  Filled 2016-01-08: qty 2.5

## 2016-01-08 MED ORDER — SODIUM CHLORIDE 0.9 % IV SOLN
INTRAVENOUS | Status: AC
  Administered 2016-01-09: 69 mL/h via INTRAVENOUS
  Filled 2016-01-08: qty 40

## 2016-01-08 MED ORDER — VANCOMYCIN HCL 10 G IV SOLR
1500.0000 mg | INTRAVENOUS | Status: AC
  Administered 2016-01-09: 1250 mg via INTRAVENOUS
  Filled 2016-01-08: qty 1500

## 2016-01-08 MED ORDER — VANCOMYCIN HCL 1000 MG IV SOLR
INTRAVENOUS | Status: AC
  Administered 2016-01-09: 1000 mL
  Filled 2016-01-08: qty 1000

## 2016-01-08 MED ORDER — POTASSIUM CHLORIDE 2 MEQ/ML IV SOLN
80.0000 meq | INTRAVENOUS | Status: DC
  Filled 2016-01-08: qty 40

## 2016-01-08 MED ORDER — MAGNESIUM SULFATE 50 % IJ SOLN
40.0000 meq | INTRAMUSCULAR | Status: DC
  Filled 2016-01-08: qty 10

## 2016-01-08 MED ORDER — DEXMEDETOMIDINE HCL IN NACL 400 MCG/100ML IV SOLN
0.1000 ug/kg/h | INTRAVENOUS | Status: AC
  Administered 2016-01-09: 0.7 ug/kg/h via INTRAVENOUS
  Filled 2016-01-08: qty 100

## 2016-01-08 MED ORDER — SODIUM CHLORIDE 0.9 % IV SOLN
INTRAVENOUS | Status: AC
  Administered 2016-01-09: 1.2 [IU]/h via INTRAVENOUS
  Filled 2016-01-08: qty 2.5

## 2016-01-08 MED ORDER — PHENYLEPHRINE HCL 10 MG/ML IJ SOLN
30.0000 ug/min | INTRAMUSCULAR | Status: AC
  Administered 2016-01-09: 25 ug/min via INTRAVENOUS
  Filled 2016-01-08: qty 2

## 2016-01-08 MED ORDER — NITROGLYCERIN IN D5W 200-5 MCG/ML-% IV SOLN
2.0000 ug/min | INTRAVENOUS | Status: AC
Start: 2016-01-09 — End: 2016-01-09
  Administered 2016-01-09: 16 ug/min via INTRAVENOUS
  Filled 2016-01-08: qty 250

## 2016-01-08 MED ORDER — DOPAMINE-DEXTROSE 3.2-5 MG/ML-% IV SOLN
0.0000 ug/kg/min | INTRAVENOUS | Status: DC
  Filled 2016-01-08: qty 250

## 2016-01-08 MED ORDER — EPINEPHRINE HCL 1 MG/ML IJ SOLN
0.0000 ug/min | INTRAVENOUS | Status: DC
  Filled 2016-01-08: qty 4

## 2016-01-08 NOTE — H&P (Signed)
[address]       [place] [address]             [phone]          CARDIOTHORACIC SURGERY HISTORY AND PHYSICAL EXAM  PCP is TAPPER,DAVID B, MD Referring Provider is KATZ, JEFFREY D, MD Primary Cardiologist is MCDOWELL, Aloha Gell, MD  Reason for consultation: Severe multi-vessel CAD with unstable angina  HPI:  Patient is a 60 year old male with known history of coronary artery disease, hypertension, hypercholesterolemia, type 2 diabetes mellitus, obstructive sleep apnea, GE reflux disease, asthma, and TIAs who was admitted to the hospital with symptoms consistent with unstable angina and has been referred for surgical consultation to discuss treatment options for management of multivessel coronary artery disease. Patient's cardiac history dates back approximately 15 years ago when he first presented with symptoms of angina pectoris. He was treated with PCI and stenting at that time. In January 2016 he presented with unstable angina and underwent PCI with placement of drug eluding stents in the proximal left anterior descending coronary artery and the first obtuse marginal branch of the left circumflex coronary artery. He has been followed for the last several years by Dr. Domenic Polite through the [facility] office in [place]. The patient states that he remained stable until last week when he developed an episode of substernal chest pressure associated with dyspnea, diaphoresis, and nausea while he was working at [employer]. He was observed overnight in the emergency department and ruled out by serial cardiac enzymes. He was instructed to follow-up with his primary cardiologist, but symptoms recurred 2 days later and the patient was readmitted to the hospital. Chest pain resolved prior to admission following administration of aspirin and sublingual nitroglycerin by EMS. Baseline electrocardiogram reveals sinus rhythm without ST segment changes and the patient has ruled out  for acute myocardial infarction. Diagnostic cardiac catheterization performed by Dr. Alvester Chou demonstrates severe multivessel coronary artery disease with in-stent restenosis in the distal portion of the stents in the left anterior descending coronary artery and severe progression of disease in the distal left anterior descending coronary artery. The patient also has 70-80% stenosis in the left circumflex system with nonobstructive disease in the right coronary artery distribution. Left ventricular systolic function is preserved. Initially medical therapy was initially recommended, but surgical consultation was subsequently requested.  While in the hospital the patient continued to have frequent episodes of exertional angina despite maximal medical therapy.  The patient is married and lives in [place] with his wife and 3 children. He is on partial disability but works part-time at [employer]. He reports no specific physical limitations and had been doing well up until recently. He does report symptoms of exertional shortness of breath that have been stable. He denies any history of resting shortness of breath, PND, orthopnea, palpitations, or syncope. He has had occasional dizzy spells without syncope.    Past Medical History  Diagnosis Date  . Coronary atherosclerosis of native coronary artery     a. LAD & OM stenting;  b. 2007 Cath: nonobs dzs, patent stents;  c. 07/2012 neg MV, EF 61%.  d. 11/20/14: [place] s/p  PCI w/ DES to pLAD and DES to OM1   . Asthma   . Essential hypertension, benign   . TIA (transient ischemic attack) ~ 2010  . Anxiety   . Hypercholesteremia   . GERD (gastroesophageal reflux disease)   . OSA on CPAP   . Type 2 diabetes mellitus (  [place])   . History of hiatal hernia   . Migraine   . Arthritis   . Lumbar herniated disc   . Chronic lower back pain   . History of gout   . Thrombocytopenia ([place])   . MI, old   . Hearing loss of left ear   . Cataract     right eye    Past  Surgical History  Procedure Laterality Date  . Anterior cervical decomp/discectomy fusion  1998    "C3-4"  . Carpal tunnel release Bilateral 2005  . Shoulder arthroscopy Left ~ 2011  . Knee surgery Left 02/2012    "scraped; open"  . Neuroplasty / transposition ulnar nerve at elbow Right ~ 2012  . Left heart catheterization with coronary angiogram N/A 07/20/2012    Procedure: LEFT HEART CATHETERIZATION WITH CORONARY ANGIOGRAM;  Surgeon: [name], MD;  Location: [facility] CATH LAB;  Service: Cardiovascular;  Laterality: N/A;  . Coronary angioplasty with stent placement  2002; 2003; 11/20/2014    "I have 4 stents after today" (11/20/2014)  . Cardiac catheterization  "several"  . Coronary angioplasty    . Left heart catheterization with coronary angiogram N/A 11/20/2014    Procedure: LEFT HEART CATHETERIZATION WITH CORONARY ANGIOGRAM;  Surgeon: [name], MD;  Location: [facility] CATH LAB;  Service: Cardiovascular;  Laterality: N/A;  . Percutaneous coronary rotoblator intervention (pci-r)  11/20/2014    Procedure: PERCUTANEOUS CORONARY ROTOBLATOR INTERVENTION (PCI-R);  Surgeon: [name], MD;  Location: [facility] CATH LAB;  Service: Cardiovascular;;  . Left heart catheterization with coronary angiogram N/A 11/26/2014    Procedure: LEFT HEART CATHETERIZATION WITH CORONARY ANGIOGRAM;  Surgeon: [name], MD;  Location: [facility] CATH LAB;  Service: Cardiovascular;  Laterality: N/A;  . Cardiac catheterization N/A 12/30/2015    Procedure: Left Heart Cath and Coronary Angiography;  Surgeon: [name], MD;  Location: [facility] CV LAB;  Service: Cardiovascular;  Laterality: N/A;  . Colonoscopy    . Esophagogastroduodenoscopy      Family History  Problem Relation Age of Onset  . Coronary artery disease Father 51    Social History Social History  Substance Use Topics  . Smoking status: Never Smoker   . Smokeless tobacco: Never Used  . Alcohol Use: No    Prior to Admission medications     Medication Sig Start Date End Date Taking? Authorizing Provider  acetaminophen (TYLENOL) 500 MG tablet Take 1,000 mg by mouth every 6 (six) hours as needed (pain).    Historical Provider, MD  albuterol (PROVENTIL HFA;VENTOLIN HFA) 108 (90 BASE) MCG/ACT inhaler Inhale 2 puffs into the lungs every 6 (six) hours as needed for wheezing or shortness of breath.    Historical Provider, MD  albuterol (PROVENTIL) (2.5 MG/3ML) 0.083% nebulizer solution Take 2.5 mg by nebulization every 6 (six) hours as needed for wheezing or shortness of breath.    Historical Provider, MD  amLODipine (NORVASC) 2.5 MG tablet Take 1 tablet (2.5 mg total) by mouth 2 (two) times daily. 01/02/16   Thurnell Lose, MD  aspirin EC 81 MG tablet Take 81 mg by mouth at bedtime.     Historical Provider, MD  citalopram (CELEXA) 20 MG tablet Take 20 mg by mouth at bedtime.     Historical Provider, MD  fluticasone (FLONASE) 50 MCG/ACT nasal spray Place 1 spray into the nose 2 (two) times daily.     Historical Provider, MD  Fluticasone-Salmeterol (ADVAIR) 250-50 MCG/DOSE AEPB Inhale 1 puff into the lungs  every 12 (twelve) hours.    Historical Provider, MD  gabapentin (NEURONTIN) 400 MG capsule Take 400 mg by mouth 3 (three) times daily.    Historical Provider, MD  insulin aspart (NOVOLOG FLEXPEN) 100 UNIT/ML FlexPen Inject 1-15 Units into the skin 3 (three) times daily as needed for high blood sugar (CBG >120). Per sliding scale written down at home    Historical Provider, MD  Insulin Glargine (LANTUS SOLOSTAR) 100 UNIT/ML Solostar Pen Inject 60 Units into the skin 2 (two) times daily.    Historical Provider, MD  Melatonin 5 MG TABS Take 10 mg by mouth at bedtime.     Historical Provider, MD  metFORMIN (GLUCOPHAGE) 1000 MG tablet Take 1 tablet (1,000 mg total) by mouth 2 (two) times daily. 01/02/16   Thurnell Lose, MD  metoprolol (LOPRESSOR) 50 MG tablet Take 1 tablet (50 mg total) by mouth daily. Patient taking differently: Take 50 mg by  mouth at bedtime.  06/04/15   [name], MD  nitroGLYCERIN (NITRODUR - DOSED IN MG/24 HR) 0.2 mg/hr patch Place 1 patch (0.2 mg total) onto the skin daily. 01/02/16   Thurnell Lose, MD  nitroGLYCERIN (NITROSTAT) 0.4 MG SL tablet Place 0.4 mg under the tongue every 5 (five) minutes as needed for chest pain.     Historical Provider, MD  omeprazole (PRILOSEC) 20 MG capsule Take 20 mg by mouth 2 (two) times daily before a meal.    Historical Provider, MD  ramipril (ALTACE) 5 MG tablet Take 5 mg by mouth at bedtime.     Historical Provider, MD  terbinafine (LAMISIL) 250 MG tablet Take 250 mg by mouth at bedtime. For toe fungus    Historical Provider, MD    Allergies  Allergen Reactions  . Imdur [Isosorbide Dinitrate] Other (See Comments)    Severe headache  . Statins Other (See Comments)    Muscle aches and cramps  . Tricor [Fenofibrate] Other (See Comments)    Leg cramps  . Ranexa [Ranolazine] Other (See Comments)    Review of Systems:  General:normal appetite, normal energy, no weight gain, no weight loss, no fever Cardiac:+ chest pain with exertion, no chest pain at rest, + SOB with exertion, no resting SOB, no PND, no orthopnea, no palpitations, no arrhythmia, no atrial fibrillation, no LE edema, + dizzy spells, no syncope Respiratory:+ exertional shortness of breath, no home oxygen, no productive cough, no dry cough, no bronchitis, no wheezing, no hemoptysis, + asthma, no pain with inspiration or cough, + sleep apnea, + CPAP at night GI:no difficulty swallowing, no reflux, no frequent heartburn, no hiatal hernia, no abdominal pain, no constipation, no diarrhea, no hematochezia, no hematemesis, no melena GU:no dysuria, no frequency, no urinary tract infection, no hematuria, no enlarged prostate, no kidney  stones, no kidney disease Vascular:no pain suggestive of claudication, no pain in feet, no leg cramps, no varicose veins, no DVT, no non-healing foot ulcer Neuro:no stroke, + remote h/o TIA's, no seizures, no headaches, no temporary blindness one eye, no slurred speech, no peripheral neuropathy, no chronic pain, no instability of gait, no memory/cognitive dysfunction Musculoskeletal:+ arthritis - primarily involving the back, hands and ankles, no joint swelling, no myalgias, no difficulty walking, normal mobility  Skin:no rash, no itching, no skin infections, no pressure sores or ulcerations Psych:no anxiety, + depression, no nervousness, no unusual recent stress Eyes:+ blurry vision, no floaters, no recent vision changes, + wears glasses or contacts ENT:no hearing loss, no loose or painful teeth, no  dentures, last saw dentist within the past year Hematologic:no easy bruising, no abnormal bleeding, no clotting disorder, no frequent epistaxis Endocrine:+ diabetes, does check CBG's at home   Physical Exam:  BP 139/69 mmHg  Pulse 72  Temp(Src) 98.3 F (36.8 C) (Oral)  Resp 15  Ht 5' 9"  (1.753 m)  Wt 231 lb 4.8 oz (104.917 kg)  BMI 34.14 kg/m2  SpO2 94% General:Mildly obese, well-appearing HEENT:Unremarkable  Neck:no JVD, no bruits, no adenopathy  Chest:clear to auscultation, symmetrical breath sounds, no wheezes, no rhonchi  CV:RRR, no murmur   Abdomen:soft, non-tender, no masses  Extremities:warm, well-perfused, pulses palpable with normal Allen's test on left arm, no lower extremity edema Rectal/GUDeferred Neuro:Grossly non-focal and symmetrical throughout Skin:Clean and dry, no rashes, no breakdown   Diagnostic Tests:  CARDIAC CATHETERIZATION Procedures    Left Heart Cath and Coronary Angiography    Conclusion     Ost RPDA lesion, 80% stenosed.  Lat 1st Mrg-1 lesion, 70% stenosed.  1st Mrg-1 lesion, 70% stenosed.  1st Mrg-2 lesion, 80% stenosed.  1st Diag lesion, 80% stenosed.  Mid LAD lesion, 75% stenosed.  Dist LAD-1 lesion, 75% stenosed.  Dist LAD-2 lesion, 90% stenosed.  The left ventricular systolic function is normal.  [name] [name] is a 60 y.o. male   [id number] LOCATION: FACILITY: [facility]  PHYSICIAN: Quay Burow, M.D. February 03, 1956   DATE OF PROCEDURE: 12/30/2015  DATE OF DISCHARGE:     CARDIAC CATHETERIZATION     History obtained from chart review. Mr. Sprinkle is 60 y/o Caucasian male with a history of CAD. He had cardiac catheterization performed January 2015 with PCI and stenting of his LAD and LCX-OM branch by Dr [name]. He was admitted with Unstable angina. He presents now for cath and potential intervention.    IMPRESSION:Mr. Goshert has patent stent with progression of his mid-distal LAD in a 1.5-1.75mm vessel. Circumflex is unchanged from his prior angiogram. Continued medical Rx will be recommended. The sheath was removed and a TR band on the right wrist to achieve patent hemostasis.  Quay Burow. MD, [facility] 12/30/2015 1:07 PM        Indications    Coronary artery disease due to lipid rich plaque [I25.10 (ICD-10-CM)]    Technique and Indications    PROCEDURE DESCRIPTION:    The patient was brought to the second floor [facility] cath lab in the postabsorptive state. He was  premedicated with Valium 5 mg , iv versed and fentanyl. [name] His right wristwas prepped and shaved in usual sterile fashion. Xylocaine 1% was used for local anesthesia. A 6 French sheath was inserted into the right radial artery using standard Seldinger technique. The patient received 5000 units of heparin intravenously. A 5 [place] TIG catheter and pigtail catheter were used for cornary angiography and left venticulography. Omnipaque dye was used for the entirety of the case.Retrograde aortic, left ventricular and back pressure were recorded. The patient received radial cocktail via the SideArm sheath.  Estimated blood loss <50 mL. There were no immediate complications during the procedure.    Coronary Findings    Dominance: Right   Left Anterior Descending   . Prox LAD to Mid LAD lesion, 0% stenosed. Previously placed Prox LAD to Mid LAD stent (unknown type) is patent.   . Mid LAD lesion, 75% stenosed.   . Dist LAD-1 lesion, 75% stenosed.   . Dist LAD-2 lesion, 90% stenosed.   . First Diagonal Branch   . 1st Diag lesion, 80% stenosed.  Left Circumflex   . First Obtuse Marginal Branch   . 1st Mrg-1 lesion, 70% stenosed.   . 1st Mrg-2 lesion, 80% stenosed.   . Lateral First Obtuse Marginal Branch   . Lat 1st Mrg-1 lesion, 70% stenosed.   . Lat 1st Mrg-2 lesion, 0% stenosed. Previously placed Lat 1st Mrg-2 stent (unknown type) is patent.     Right Coronary Artery   . Right Posterior Descending Artery   . Ost RPDA lesion, 80% stenosed.      Wall Motion                 Left Heart    Left Ventricle The left ventricular size is normal. The left ventricular systolic function is normal. The left ventricular ejection fraction is 55-65% by visual estimate. There are no wall motion abnormalities in the  left ventricle.    Coronary Diagrams    Diagnostic Diagram            Implants    No implant documentation for this case.    PACS Images    Show images for Cardiac catheterization     Link to Procedure Log    Procedure Log      Hemo Data       Most Recent Value   AO Systolic Pressure  671 mmHg   AO Diastolic Pressure  69 mmHg   AO Mean  93 mmHg   LV Systolic Pressure  245 mmHg   LV Diastolic Pressure  8 mmHg   LV EDP  13 mmHg         Impression:  Patient has severe multivessel coronary artery disease with preserved left ventricular systolic function. He presents with recent onset symptoms of chest pain consistent with unstable angina. Symptoms have improved but not resolved completely since he has been in the hospital on medical therapy. I have personally reviewed the patient's diagnostic cardiac catheterization. He has diffuse in-stent restenosis in the distal portion of the stents in the mid left anterior descending coronary artery with multiple areas of long segment high-grade stenosis in the distal left anterior descending coronary artery beyond the terminal portion of the stents. Unfortunately, the distal portion of the LAD does not appear to be a very good vessel although there may be an area where bypass grafting could be performed out close to the apex. There is also significant disease in the left circumflex territory with 70-80% stenosis prior to a large second obtuse marginal branch. There is nonobstructive disease in the right coronary territory.   Because the distal portion of the left anterior descending coronary artery appears to be a relatively poor target vessel for grafting, an attempt at medical therapy may be reasonable. With an attempt at coronary artery bypass grafting the patient might be at increased risk for perioperative myocardial infarction and/or premature graft failure.  However, this patient is relatively young and I remain skeptical that he will do well with medical therapy alone. I do not feel that it would make sense to attempt repeat percutaneous coronary intervention. I would favor surgical revascularization since the patient continues to symptoms despite maximal medical therapy.       Plan:  I have reviewed the indications, risks, and potential benefits of coronary artery bypass grafting with the patient and his wife. Alternative treatment strategies have been discussed, including the relative risks, benefits and long term prognosis associated with medical therapy, percutaneous coronary intervention, and surgical revascularization. The patient understands and accepts all potential associated risks  of surgery including but not limited to risk of death, stroke or other neurologic complication, myocardial infarction, congestive heart failure, respiratory failure, renal failure, bleeding requiring blood transfusion and/or reexploration, aortic dissection or other major vascular complication, arrhythmia, heart block or bradycardia requiring permanent pacemaker, pneumonia, pleural effusion, wound infection, pulmonary embolus or other thromboembolic complication, chronic pain or other delayed complications related to median sternotomy, or the late recurrence of symptomatic ischemic heart disease and/or congestive heart failure. The importance of long term risk modification have been emphasized. All questions answered.  I spent in excess of 15 minutes during the conduct of this hospital encounter and >50% of this time involved direct face-to-face encounter with the patient for counseling and/or coordination of their care.   Rexene Alberts, MD 01/02/2016 9:06 AM

## 2016-01-09 ENCOUNTER — Inpatient Hospital Stay (HOSPITAL_COMMUNITY): Payer: Medicare Other | Admitting: Anesthesiology

## 2016-01-09 ENCOUNTER — Inpatient Hospital Stay (HOSPITAL_COMMUNITY)
Admission: RE | Admit: 2016-01-09 | Discharge: 2016-01-14 | DRG: 236 | Disposition: A | Discharge status: Home / Self Care | Payer: Medicare Other | Source: Ambulatory Visit | Attending: Thoracic Surgery (Cardiothoracic Vascular Surgery) | Admitting: Thoracic Surgery (Cardiothoracic Vascular Surgery)

## 2016-01-09 ENCOUNTER — Encounter (HOSPITAL_COMMUNITY): Payer: Self-pay | Admitting: *Deleted

## 2016-01-09 ENCOUNTER — Inpatient Hospital Stay (HOSPITAL_COMMUNITY): Payer: Medicare Other

## 2016-01-09 ENCOUNTER — Encounter (HOSPITAL_COMMUNITY)
Admission: RE | Disposition: A | Discharge status: Home / Self Care | Payer: Self-pay | Source: Ambulatory Visit | Attending: Thoracic Surgery (Cardiothoracic Vascular Surgery)

## 2016-01-09 DIAGNOSIS — Z8673 Personal history of transient ischemic attack (TIA), and cerebral infarction without residual deficits: Secondary | ICD-10-CM

## 2016-01-09 DIAGNOSIS — R200 Anesthesia of skin: Secondary | ICD-10-CM | POA: Diagnosis not present

## 2016-01-09 DIAGNOSIS — E1169 Type 2 diabetes mellitus with other specified complication: Secondary | ICD-10-CM

## 2016-01-09 DIAGNOSIS — Z951 Presence of aortocoronary bypass graft: Secondary | ICD-10-CM

## 2016-01-09 DIAGNOSIS — I252 Old myocardial infarction: Secondary | ICD-10-CM

## 2016-01-09 DIAGNOSIS — J45909 Unspecified asthma, uncomplicated: Secondary | ICD-10-CM | POA: Diagnosis present

## 2016-01-09 DIAGNOSIS — D62 Acute posthemorrhagic anemia: Secondary | ICD-10-CM | POA: Diagnosis not present

## 2016-01-09 DIAGNOSIS — Z794 Long term (current) use of insulin: Secondary | ICD-10-CM

## 2016-01-09 DIAGNOSIS — I10 Essential (primary) hypertension: Secondary | ICD-10-CM | POA: Diagnosis present

## 2016-01-09 DIAGNOSIS — E8770 Fluid overload, unspecified: Secondary | ICD-10-CM | POA: Diagnosis not present

## 2016-01-09 DIAGNOSIS — R079 Chest pain, unspecified: Secondary | ICD-10-CM | POA: Diagnosis present

## 2016-01-09 DIAGNOSIS — I2510 Atherosclerotic heart disease of native coronary artery without angina pectoris: Secondary | ICD-10-CM | POA: Diagnosis present

## 2016-01-09 DIAGNOSIS — Z6836 Body mass index (BMI) 36.0-36.9, adult: Secondary | ICD-10-CM | POA: Diagnosis not present

## 2016-01-09 DIAGNOSIS — I2583 Coronary atherosclerosis due to lipid rich plaque: Secondary | ICD-10-CM | POA: Diagnosis present

## 2016-01-09 DIAGNOSIS — Y831 Surgical operation with implant of artificial internal device as the cause of abnormal reaction of the patient, or of later complication, without mention of misadventure at the time of the procedure: Secondary | ICD-10-CM | POA: Diagnosis present

## 2016-01-09 DIAGNOSIS — E875 Hyperkalemia: Secondary | ICD-10-CM | POA: Diagnosis not present

## 2016-01-09 DIAGNOSIS — K219 Gastro-esophageal reflux disease without esophagitis: Secondary | ICD-10-CM | POA: Diagnosis present

## 2016-01-09 DIAGNOSIS — E782 Mixed hyperlipidemia: Secondary | ICD-10-CM | POA: Diagnosis present

## 2016-01-09 DIAGNOSIS — E669 Obesity, unspecified: Secondary | ICD-10-CM | POA: Diagnosis present

## 2016-01-09 DIAGNOSIS — I25110 Atherosclerotic heart disease of native coronary artery with unstable angina pectoris: Secondary | ICD-10-CM | POA: Diagnosis present

## 2016-01-09 DIAGNOSIS — G4730 Sleep apnea, unspecified: Secondary | ICD-10-CM | POA: Diagnosis present

## 2016-01-09 DIAGNOSIS — E119 Type 2 diabetes mellitus without complications: Secondary | ICD-10-CM | POA: Diagnosis present

## 2016-01-09 DIAGNOSIS — D6959 Other secondary thrombocytopenia: Secondary | ICD-10-CM | POA: Diagnosis not present

## 2016-01-09 DIAGNOSIS — Z7982 Long term (current) use of aspirin: Secondary | ICD-10-CM

## 2016-01-09 DIAGNOSIS — G4733 Obstructive sleep apnea (adult) (pediatric): Secondary | ICD-10-CM | POA: Diagnosis present

## 2016-01-09 DIAGNOSIS — H9192 Unspecified hearing loss, left ear: Secondary | ICD-10-CM | POA: Diagnosis present

## 2016-01-09 DIAGNOSIS — R072 Precordial pain: Secondary | ICD-10-CM | POA: Diagnosis present

## 2016-01-09 DIAGNOSIS — H269 Unspecified cataract: Secondary | ICD-10-CM | POA: Diagnosis present

## 2016-01-09 DIAGNOSIS — J9811 Atelectasis: Secondary | ICD-10-CM | POA: Diagnosis not present

## 2016-01-09 DIAGNOSIS — T82855A Stenosis of coronary artery stent, initial encounter: Secondary | ICD-10-CM | POA: Diagnosis present

## 2016-01-09 DIAGNOSIS — M5126 Other intervertebral disc displacement, lumbar region: Secondary | ICD-10-CM | POA: Diagnosis present

## 2016-01-09 DIAGNOSIS — D696 Thrombocytopenia, unspecified: Secondary | ICD-10-CM | POA: Diagnosis present

## 2016-01-09 DIAGNOSIS — K5900 Constipation, unspecified: Secondary | ICD-10-CM | POA: Diagnosis not present

## 2016-01-09 DIAGNOSIS — E1165 Type 2 diabetes mellitus with hyperglycemia: Secondary | ICD-10-CM

## 2016-01-09 DIAGNOSIS — E785 Hyperlipidemia, unspecified: Secondary | ICD-10-CM | POA: Diagnosis present

## 2016-01-09 DIAGNOSIS — Z9861 Coronary angioplasty status: Secondary | ICD-10-CM

## 2016-01-09 HISTORY — PX: TEE WITHOUT CARDIOVERSION: SHX5443

## 2016-01-09 HISTORY — DX: Presence of aortocoronary bypass graft: Z95.1

## 2016-01-09 HISTORY — PX: CORONARY ARTERY BYPASS GRAFT: SHX141

## 2016-01-09 LAB — POCT I-STAT, CHEM 8
BUN: 18 mg/dL (ref 6–20)
BUN: 19 mg/dL (ref 6–20)
BUN: 16 mg/dL (ref 6–20)
BUN: 17 mg/dL (ref 6–20)
BUN: 16 mg/dL (ref 6–20)
BUN: 16 mg/dL (ref 6–20)
CALCIUM ION: 1.23 mmol/L (ref 1.12–1.23)
CALCIUM ION: 1 mmol/L — AB (ref 1.12–1.23)
CALCIUM ION: 1.1 mmol/L — AB (ref 1.12–1.23)
CALCIUM ION: 1.04 mmol/L — AB (ref 1.12–1.23)
CHLORIDE: 102 mmol/L (ref 101–111)
CHLORIDE: 98 mmol/L — AB (ref 101–111)
CHLORIDE: 105 mmol/L (ref 101–111)
CREATININE: 0.9 mg/dL (ref 0.61–1.24)
CREATININE: 1 mg/dL (ref 0.61–1.24)
Calcium, Ion: 1.22 mmol/L (ref 1.12–1.23)
Calcium, Ion: 1.15 mmol/L (ref 1.12–1.23)
Chloride: 102 mmol/L (ref 101–111)
Chloride: 100 mmol/L — ABNORMAL LOW (ref 101–111)
Chloride: 101 mmol/L (ref 101–111)
Creatinine, Ser: 0.9 mg/dL (ref 0.61–1.24)
Creatinine, Ser: 0.9 mg/dL (ref 0.61–1.24)
Creatinine, Ser: 0.9 mg/dL (ref 0.61–1.24)
Creatinine, Ser: 1 mg/dL (ref 0.61–1.24)
GLUCOSE: 119 mg/dL — AB (ref 65–99)
GLUCOSE: 116 mg/dL — AB (ref 65–99)
Glucose, Bld: 107 mg/dL — ABNORMAL HIGH (ref 65–99)
Glucose, Bld: 163 mg/dL — ABNORMAL HIGH (ref 65–99)
Glucose, Bld: 180 mg/dL — ABNORMAL HIGH (ref 65–99)
Glucose, Bld: 124 mg/dL — ABNORMAL HIGH (ref 65–99)
HCT: 41 % (ref 39.0–52.0)
HCT: 38 % — ABNORMAL LOW (ref 39.0–52.0)
HCT: 29 % — ABNORMAL LOW (ref 39.0–52.0)
HEMATOCRIT: 32 % — AB (ref 39.0–52.0)
HEMATOCRIT: 33 % — AB (ref 39.0–52.0)
HEMATOCRIT: 40 % (ref 39.0–52.0)
HEMOGLOBIN: 13.9 g/dL (ref 13.0–17.0)
HEMOGLOBIN: 9.9 g/dL — AB (ref 13.0–17.0)
HEMOGLOBIN: 10.9 g/dL — AB (ref 13.0–17.0)
Hemoglobin: 12.9 g/dL — ABNORMAL LOW (ref 13.0–17.0)
Hemoglobin: 11.2 g/dL — ABNORMAL LOW (ref 13.0–17.0)
Hemoglobin: 13.6 g/dL (ref 13.0–17.0)
POTASSIUM: 3.7 mmol/L (ref 3.5–5.1)
POTASSIUM: 4.1 mmol/L (ref 3.5–5.1)
POTASSIUM: 4.8 mmol/L (ref 3.5–5.1)
Potassium: 3.7 mmol/L (ref 3.5–5.1)
Potassium: 4 mmol/L (ref 3.5–5.1)
Potassium: 4.5 mmol/L (ref 3.5–5.1)
SODIUM: 139 mmol/L (ref 135–145)
SODIUM: 136 mmol/L (ref 135–145)
SODIUM: 138 mmol/L (ref 135–145)
SODIUM: 141 mmol/L (ref 135–145)
Sodium: 139 mmol/L (ref 135–145)
Sodium: 140 mmol/L (ref 135–145)
TCO2: 26 mmol/L (ref 0–100)
TCO2: 27 mmol/L (ref 0–100)
TCO2: 26 mmol/L (ref 0–100)
TCO2: 25 mmol/L (ref 0–100)
TCO2: 24 mmol/L (ref 0–100)
TCO2: 27 mmol/L (ref 0–100)

## 2016-01-09 LAB — POCT I-STAT 3, ART BLOOD GAS (G3+)
ACID-BASE DEFICIT: 1 mmol/L (ref 0.0–2.0)
Acid-base deficit: 3 mmol/L — ABNORMAL HIGH (ref 0.0–2.0)
BICARBONATE: 24 meq/L (ref 20.0–24.0)
Bicarbonate: 25.1 mEq/L — ABNORMAL HIGH (ref 20.0–24.0)
Bicarbonate: 22.8 mEq/L (ref 20.0–24.0)
O2 SAT: 99 %
O2 Saturation: 100 %
O2 Saturation: 97 %
PCO2 ART: 50.1 mmHg — AB (ref 35.0–45.0)
PH ART: 7.381 (ref 7.350–7.450)
PH ART: 7.293 — AB (ref 7.350–7.450)
PO2 ART: 104 mmHg — AB (ref 80.0–100.0)
Patient temperature: 37.8
TCO2: 26 mmol/L (ref 0–100)
TCO2: 24 mmol/L (ref 0–100)
TCO2: 25 mmol/L (ref 0–100)
pCO2 arterial: 42.3 mmHg (ref 35.0–45.0)
pCO2 arterial: 35.7 mmHg (ref 35.0–45.0)
pH, Arterial: 7.412 (ref 7.350–7.450)
pO2, Arterial: 240 mmHg — ABNORMAL HIGH (ref 80.0–100.0)
pO2, Arterial: 123 mmHg — ABNORMAL HIGH (ref 80.0–100.0)

## 2016-01-09 LAB — CBC
HEMATOCRIT: 38.7 % — AB (ref 39.0–52.0)
HEMATOCRIT: 38.3 % — AB (ref 39.0–52.0)
HEMOGLOBIN: 14.2 g/dL (ref 13.0–17.0)
Hemoglobin: 13.6 g/dL (ref 13.0–17.0)
MCH: 32.4 pg (ref 26.0–34.0)
MCH: 31.6 pg (ref 26.0–34.0)
MCHC: 36.7 g/dL — ABNORMAL HIGH (ref 30.0–36.0)
MCHC: 35.5 g/dL (ref 30.0–36.0)
MCV: 88.4 fL (ref 78.0–100.0)
MCV: 89.1 fL (ref 78.0–100.0)
PLATELETS: 101 10*3/uL — AB (ref 150–400)
Platelets: 80 10*3/uL — ABNORMAL LOW (ref 150–400)
RBC: 4.38 MIL/uL (ref 4.22–5.81)
RBC: 4.3 MIL/uL (ref 4.22–5.81)
RDW: 12.5 % (ref 11.5–15.5)
RDW: 12.6 % (ref 11.5–15.5)
WBC: 7.5 10*3/uL (ref 4.0–10.5)
WBC: 10 10*3/uL (ref 4.0–10.5)

## 2016-01-09 LAB — GLUCOSE, CAPILLARY
GLUCOSE-CAPILLARY: 90 mg/dL (ref 65–99)
Glucose-Capillary: 122 mg/dL — ABNORMAL HIGH (ref 65–99)
Glucose-Capillary: 102 mg/dL — ABNORMAL HIGH (ref 65–99)
Glucose-Capillary: 98 mg/dL (ref 65–99)
Glucose-Capillary: 101 mg/dL — ABNORMAL HIGH (ref 65–99)
Glucose-Capillary: 113 mg/dL — ABNORMAL HIGH (ref 65–99)

## 2016-01-09 LAB — APTT: aPTT: 33 seconds (ref 24–37)

## 2016-01-09 LAB — HEMOGLOBIN AND HEMATOCRIT, BLOOD
HEMATOCRIT: 31.7 % — AB (ref 39.0–52.0)
HEMOGLOBIN: 11.5 g/dL — AB (ref 13.0–17.0)

## 2016-01-09 LAB — CREATININE, SERUM
CREATININE: 1.13 mg/dL (ref 0.61–1.24)
GFR calc Af Amer: >60 mL/min (ref >60)

## 2016-01-09 LAB — PROTIME-INR
INR: 1.38 (ref 0.00–1.49)
Prothrombin Time: 17.1 seconds — ABNORMAL HIGH (ref 11.6–15.2)

## 2016-01-09 LAB — POCT I-STAT 4, (NA,K, GLUC, HGB,HCT)
Glucose, Bld: 134 mg/dL — ABNORMAL HIGH (ref 65–99)
HEMATOCRIT: 39 % (ref 39.0–52.0)
HEMOGLOBIN: 13.3 g/dL (ref 13.0–17.0)
POTASSIUM: 4.1 mmol/L (ref 3.5–5.1)
SODIUM: 141 mmol/L (ref 135–145)

## 2016-01-09 LAB — PLATELET COUNT: Platelets: 103 10*3/uL — ABNORMAL LOW (ref 150–400)

## 2016-01-09 LAB — MAGNESIUM: Magnesium: 2.7 mg/dL — ABNORMAL HIGH (ref 1.7–2.4)

## 2016-01-09 SURGERY — CORONARY ARTERY BYPASS GRAFTING (CABG)
Anesthesia: General | Site: Chest

## 2016-01-09 MED ORDER — FENTANYL CITRATE (PF) 250 MCG/5ML IJ SOLN
INTRAMUSCULAR | Status: AC
Start: 2016-01-09 — End: 2016-01-09
  Filled 2016-01-09: qty 5

## 2016-01-09 MED ORDER — SODIUM CHLORIDE 0.9% FLUSH
3.0000 mL | Freq: Two times a day (BID) | INTRAVENOUS | Status: DC
  Administered 2016-01-10: 10:00:00 via INTRAVENOUS
  Administered 2016-01-10 – 2016-01-11 (×2): 3 mL via INTRAVENOUS

## 2016-01-09 MED ORDER — ROCURONIUM BROMIDE 100 MG/10ML IV SOLN
INTRAVENOUS | Status: DC | PRN
  Administered 2016-01-09 (×2): 50 mg via INTRAVENOUS
  Administered 2016-01-09: 100 mg via INTRAVENOUS
  Administered 2016-01-09: 50 mg via INTRAVENOUS

## 2016-01-09 MED ORDER — SODIUM CHLORIDE 0.9 % IV SOLN
INTRAVENOUS | Status: DC | PRN
  Administered 2016-01-09: 13:00:00 via INTRAVENOUS

## 2016-01-09 MED ORDER — OXYCODONE HCL 5 MG PO TABS
5.0000 mg | ORAL_TABLET | ORAL | Status: DC | PRN
  Administered 2016-01-10 – 2016-01-11 (×2): 10 mg via ORAL
  Filled 2016-01-09 (×2): qty 2

## 2016-01-09 MED ORDER — MAGNESIUM SULFATE 4 GM/100ML IV SOLN
4.0000 g | Freq: Once | INTRAVENOUS | Status: AC
  Administered 2016-01-09: 4 g via INTRAVENOUS
  Filled 2016-01-09: qty 100

## 2016-01-09 MED ORDER — ONDANSETRON HCL 4 MG/2ML IJ SOLN
4.0000 mg | Freq: Four times a day (QID) | INTRAMUSCULAR | Status: DC | PRN
  Administered 2016-01-10: 4 mg via INTRAVENOUS
  Filled 2016-01-09: qty 2

## 2016-01-09 MED ORDER — FAMOTIDINE IN NACL 20-0.9 MG/50ML-% IV SOLN
20.0000 mg | Freq: Two times a day (BID) | INTRAVENOUS | Status: DC
  Administered 2016-01-09: 20 mg via INTRAVENOUS
  Filled 2016-01-09: qty 50

## 2016-01-09 MED ORDER — DOCUSATE SODIUM 100 MG PO CAPS
200.0000 mg | ORAL_CAPSULE | Freq: Every day | ORAL | Status: DC
  Administered 2016-01-10 – 2016-01-11 (×2): 200 mg via ORAL
  Filled 2016-01-09 (×2): qty 2

## 2016-01-09 MED ORDER — SODIUM CHLORIDE 0.9 % IJ SOLN
INTRAMUSCULAR | Status: AC
  Filled 2016-01-09: qty 10

## 2016-01-09 MED ORDER — INSULIN REGULAR BOLUS VIA INFUSION
0.0000 [IU] | Freq: Three times a day (TID) | INTRAVENOUS | Status: DC
  Administered 2016-01-10: 2 [IU] via INTRAVENOUS
  Filled 2016-01-09: qty 10

## 2016-01-09 MED ORDER — FENTANYL CITRATE (PF) 250 MCG/5ML IJ SOLN
INTRAMUSCULAR | Status: AC
  Filled 2016-01-09: qty 5

## 2016-01-09 MED ORDER — ASPIRIN EC 325 MG PO TBEC
325.0000 mg | DELAYED_RELEASE_TABLET | Freq: Every day | ORAL | Status: DC
  Administered 2016-01-10 – 2016-01-11 (×2): 325 mg via ORAL
  Filled 2016-01-09 (×2): qty 1

## 2016-01-09 MED ORDER — MIDAZOLAM HCL 5 MG/5ML IJ SOLN
INTRAMUSCULAR | Status: DC | PRN
  Administered 2016-01-09 (×2): 3 mg via INTRAVENOUS
  Administered 2016-01-09 (×2): 2 mg via INTRAVENOUS

## 2016-01-09 MED ORDER — HEPARIN SODIUM (PORCINE) 1000 UNIT/ML IJ SOLN
INTRAMUSCULAR | Status: AC
  Filled 2016-01-09: qty 1

## 2016-01-09 MED ORDER — METOPROLOL TARTRATE 25 MG/10 ML ORAL SUSPENSION: 12.5000 mg | Freq: Two times a day (BID) | ORAL | Status: DC

## 2016-01-09 MED ORDER — MORPHINE SULFATE (PF) 2 MG/ML IV SOLN
1.0000 mg | INTRAVENOUS | Status: DC | PRN
  Administered 2016-01-09 (×2): 2 mg via INTRAVENOUS
  Filled 2016-01-09 (×2): qty 1

## 2016-01-09 MED ORDER — VANCOMYCIN HCL IN DEXTROSE 1-5 GM/200ML-% IV SOLN
1000.0000 mg | Freq: Once | INTRAVENOUS | Status: AC
  Administered 2016-01-09: 1000 mg via INTRAVENOUS
  Filled 2016-01-09: qty 200

## 2016-01-09 MED ORDER — ASPIRIN 81 MG PO CHEW: 324.0000 mg | CHEWABLE_TABLET | Freq: Every day | ORAL | Status: DC

## 2016-01-09 MED ORDER — DEXMEDETOMIDINE HCL IN NACL 400 MCG/100ML IV SOLN
0.1000 ug/kg/h | INTRAVENOUS | Status: DC
  Filled 2016-01-09: qty 100

## 2016-01-09 MED ORDER — SODIUM CHLORIDE 0.45 % IV SOLN: INTRAVENOUS | Status: DC | PRN

## 2016-01-09 MED ORDER — BISACODYL 10 MG RE SUPP: 10.0000 mg | Freq: Every day | RECTAL | Status: DC

## 2016-01-09 MED ORDER — CHLORHEXIDINE GLUCONATE CLOTH 2 % EX PADS
6.0000 | MEDICATED_PAD | Freq: Every day | CUTANEOUS | Status: AC
  Administered 2016-01-09 – 2016-01-13 (×5): 6 via TOPICAL

## 2016-01-09 MED ORDER — 0.9 % SODIUM CHLORIDE (POUR BTL) OPTIME
TOPICAL | Status: DC | PRN
  Administered 2016-01-09: 5000 mL
  Administered 2016-01-09: 1000 mL

## 2016-01-09 MED ORDER — PROTAMINE SULFATE 10 MG/ML IV SOLN
INTRAVENOUS | Status: DC | PRN
  Administered 2016-01-09: 40 mg via INTRAVENOUS
  Administered 2016-01-09: 10 mg via INTRAVENOUS
  Administered 2016-01-09: 150 mg via INTRAVENOUS
  Administered 2016-01-09 (×2): 50 mg via INTRAVENOUS

## 2016-01-09 MED ORDER — SODIUM CHLORIDE 0.9 % IV SOLN
INTRAVENOUS | Status: DC
  Administered 2016-01-10: 3.2 [IU]/h via INTRAVENOUS
  Filled 2016-01-09 (×3): qty 2.5

## 2016-01-09 MED ORDER — ALBUMIN HUMAN 5 % IV SOLN
250.0000 mL | INTRAVENOUS | Status: AC | PRN
Start: 2016-01-09 — End: 2016-01-10
  Administered 2016-01-09 (×2): 250 mL via INTRAVENOUS
  Filled 2016-01-09: qty 250

## 2016-01-09 MED ORDER — PHENYLEPHRINE HCL 10 MG/ML IJ SOLN
10.0000 mg | INTRAVENOUS | Status: DC | PRN
  Administered 2016-01-09: 10 ug/min via INTRAVENOUS
  Administered 2016-01-09: 15 ug/min via INTRAVENOUS

## 2016-01-09 MED ORDER — PROTAMINE SULFATE 10 MG/ML IV SOLN
INTRAVENOUS | Status: AC
  Filled 2016-01-09: qty 5

## 2016-01-09 MED ORDER — ANTISEPTIC ORAL RINSE SOLUTION (CORINZ)
7.0000 mL | Freq: Four times a day (QID) | OROMUCOSAL | Status: DC
  Administered 2016-01-10 – 2016-01-11 (×4): 7 mL via OROMUCOSAL

## 2016-01-09 MED ORDER — LACTATED RINGERS IV SOLN
INTRAVENOUS | Status: DC | PRN
  Administered 2016-01-09: 07:00:00 via INTRAVENOUS

## 2016-01-09 MED ORDER — ACETAMINOPHEN 160 MG/5ML PO SOLN
1000.0000 mg | Freq: Four times a day (QID) | ORAL | Status: DC
  Administered 2016-01-09: 1000 mg
  Filled 2016-01-09: qty 40.6

## 2016-01-09 MED ORDER — FENTANYL CITRATE (PF) 100 MCG/2ML IJ SOLN
INTRAMUSCULAR | Status: DC | PRN
  Administered 2016-01-09: 150 ug via INTRAVENOUS
  Administered 2016-01-09: 250 ug via INTRAVENOUS
  Administered 2016-01-09: 500 ug via INTRAVENOUS
  Administered 2016-01-09: 250 ug via INTRAVENOUS
  Administered 2016-01-09: 100 ug via INTRAVENOUS
  Administered 2016-01-09: 500 ug via INTRAVENOUS

## 2016-01-09 MED ORDER — ALBUMIN HUMAN 5 % IV SOLN
INTRAVENOUS | Status: DC | PRN
  Administered 2016-01-09: 13:00:00 via INTRAVENOUS

## 2016-01-09 MED ORDER — DEXTROSE 5 % IV SOLN
1.5000 g | Freq: Two times a day (BID) | INTRAVENOUS | Status: AC
  Administered 2016-01-09 – 2016-01-11 (×4): 1.5 g via INTRAVENOUS
  Filled 2016-01-09 (×4): qty 1.5

## 2016-01-09 MED ORDER — ACETAMINOPHEN 160 MG/5ML PO SOLN: 650.0000 mg | Freq: Once | ORAL | Status: AC

## 2016-01-09 MED ORDER — PROPOFOL 10 MG/ML IV BOLUS
INTRAVENOUS | Status: DC | PRN
  Administered 2016-01-09: 50 mg via INTRAVENOUS

## 2016-01-09 MED ORDER — LACTATED RINGERS IV SOLN: 500.0000 mL | Freq: Once | INTRAVENOUS | Status: DC | PRN

## 2016-01-09 MED ORDER — METOPROLOL TARTRATE 12.5 MG HALF TABLET: 12.5000 mg | ORAL_TABLET | Freq: Two times a day (BID) | ORAL | Status: DC

## 2016-01-09 MED ORDER — SODIUM CHLORIDE 0.9 % IV SOLN
250.0000 mL | INTRAVENOUS | Status: DC
  Administered 2016-01-10: 250 mL via INTRAVENOUS

## 2016-01-09 MED ORDER — ONDANSETRON HCL 4 MG/2ML IJ SOLN
INTRAMUSCULAR | Status: AC
Start: 2016-01-09 — End: 2016-01-09
  Filled 2016-01-09: qty 2

## 2016-01-09 MED ORDER — HEMOSTATIC AGENTS (NO CHARGE) OPTIME
TOPICAL | Status: DC | PRN
  Administered 2016-01-09: 1 via TOPICAL
  Administered 2016-01-09: 3 via TOPICAL

## 2016-01-09 MED ORDER — SODIUM CHLORIDE 0.9% FLUSH: 3.0000 mL | INTRAVENOUS | Status: DC | PRN

## 2016-01-09 MED ORDER — HEPARIN SODIUM (PORCINE) 1000 UNIT/ML IJ SOLN
INTRAMUSCULAR | Status: DC | PRN
  Administered 2016-01-09: 34000 [IU] via INTRAVENOUS

## 2016-01-09 MED ORDER — LACTATED RINGERS IV SOLN
INTRAVENOUS | Status: DC | PRN
  Administered 2016-01-09 (×2): via INTRAVENOUS

## 2016-01-09 MED ORDER — PROTAMINE SULFATE 10 MG/ML IV SOLN
INTRAVENOUS | Status: AC
  Filled 2016-01-09: qty 25

## 2016-01-09 MED ORDER — MUPIROCIN 2 % EX OINT
1.0000 | TOPICAL_OINTMENT | Freq: Two times a day (BID) | CUTANEOUS | Status: AC
Start: 2016-01-09 — End: 2016-01-14
  Administered 2016-01-09 – 2016-01-14 (×10): 1 via NASAL
  Filled 2016-01-09 (×2): qty 22

## 2016-01-09 MED ORDER — LIDOCAINE HCL (CARDIAC) 20 MG/ML IV SOLN
INTRAVENOUS | Status: AC
  Filled 2016-01-09: qty 5

## 2016-01-09 MED ORDER — LACTATED RINGERS IV SOLN: INTRAVENOUS | Status: DC

## 2016-01-09 MED ORDER — PROPOFOL 10 MG/ML IV BOLUS
INTRAVENOUS | Status: AC
  Filled 2016-01-09: qty 20

## 2016-01-09 MED ORDER — SUCCINYLCHOLINE CHLORIDE 20 MG/ML IJ SOLN
INTRAMUSCULAR | Status: AC
  Filled 2016-01-09: qty 1

## 2016-01-09 MED ORDER — MORPHINE SULFATE (PF) 2 MG/ML IV SOLN
2.0000 mg | INTRAVENOUS | Status: DC | PRN
  Administered 2016-01-10 (×4): 2 mg via INTRAVENOUS
  Filled 2016-01-09 (×4): qty 1

## 2016-01-09 MED ORDER — SODIUM CHLORIDE 0.9 % IV SOLN
INTRAVENOUS | Status: DC
  Administered 2016-01-09: 15:00:00 via INTRAVENOUS

## 2016-01-09 MED ORDER — MIDAZOLAM HCL 2 MG/2ML IJ SOLN: 2.0000 mg | INTRAMUSCULAR | Status: DC | PRN

## 2016-01-09 MED ORDER — PHENYLEPHRINE HCL 10 MG/ML IJ SOLN
0.0000 ug/min | INTRAVENOUS | Status: DC
  Administered 2016-01-09: 15 ug/min via INTRAVENOUS
  Filled 2016-01-09: qty 2

## 2016-01-09 MED ORDER — SODIUM CHLORIDE 0.9 % IV SOLN
INTRAVENOUS | Status: DC
Start: 2016-01-09 — End: 2016-01-10

## 2016-01-09 MED ORDER — METOPROLOL TARTRATE 1 MG/ML IV SOLN: 2.5000 mg | INTRAVENOUS | Status: DC | PRN

## 2016-01-09 MED ORDER — TRAMADOL HCL 50 MG PO TABS: 50.0000 mg | ORAL_TABLET | ORAL | Status: DC | PRN

## 2016-01-09 MED ORDER — ACETAMINOPHEN 500 MG PO TABS
1000.0000 mg | ORAL_TABLET | Freq: Four times a day (QID) | ORAL | Status: DC
  Administered 2016-01-10 – 2016-01-11 (×3): 1000 mg via ORAL
  Filled 2016-01-09 (×3): qty 2

## 2016-01-09 MED ORDER — EPHEDRINE SULFATE 50 MG/ML IJ SOLN
INTRAMUSCULAR | Status: AC
  Filled 2016-01-09: qty 1

## 2016-01-09 MED ORDER — BISACODYL 5 MG PO TBEC
10.0000 mg | DELAYED_RELEASE_TABLET | Freq: Every day | ORAL | Status: DC
  Administered 2016-01-10 – 2016-01-11 (×2): 10 mg via ORAL
  Filled 2016-01-09 (×2): qty 2

## 2016-01-09 MED ORDER — CHLORHEXIDINE GLUCONATE 0.12 % MT SOLN
15.0000 mL | OROMUCOSAL | Status: AC
  Administered 2016-01-09: 15 mL via OROMUCOSAL

## 2016-01-09 MED ORDER — ARTIFICIAL TEARS OP OINT
TOPICAL_OINTMENT | OPHTHALMIC | Status: DC | PRN
  Administered 2016-01-09: 1 via OPHTHALMIC

## 2016-01-09 MED ORDER — CHLORHEXIDINE GLUCONATE 0.12% ORAL RINSE (MEDLINE KIT)
15.0000 mL | Freq: Two times a day (BID) | OROMUCOSAL | Status: DC
  Administered 2016-01-09 – 2016-01-11 (×5): 15 mL via OROMUCOSAL

## 2016-01-09 MED ORDER — ROCURONIUM BROMIDE 50 MG/5ML IV SOLN
INTRAVENOUS | Status: AC
Start: 2016-01-09 — End: 2016-01-09
  Filled 2016-01-09: qty 4

## 2016-01-09 MED ORDER — PANTOPRAZOLE SODIUM 40 MG PO TBEC
40.0000 mg | DELAYED_RELEASE_TABLET | Freq: Every day | ORAL | Status: DC
  Administered 2016-01-11: 40 mg via ORAL
  Filled 2016-01-09: qty 1

## 2016-01-09 MED ORDER — DEXMEDETOMIDINE HCL IN NACL 200 MCG/50ML IV SOLN: 0.0000 ug/kg/h | INTRAVENOUS | Status: DC

## 2016-01-09 MED ORDER — ACETAMINOPHEN 650 MG RE SUPP
650.0000 mg | Freq: Once | RECTAL | Status: AC
  Administered 2016-01-09: 650 mg via RECTAL

## 2016-01-09 MED ORDER — MIDAZOLAM HCL 10 MG/2ML IJ SOLN
INTRAMUSCULAR | Status: AC
  Filled 2016-01-09: qty 2

## 2016-01-09 MED ORDER — POTASSIUM CHLORIDE 10 MEQ/50ML IV SOLN: 10.0000 meq | INTRAVENOUS | Status: AC

## 2016-01-09 MED ORDER — NITROGLYCERIN IN D5W 200-5 MCG/ML-% IV SOLN: 0.0000 ug/min | INTRAVENOUS | Status: DC

## 2016-01-09 MED ORDER — FENTANYL CITRATE (PF) 250 MCG/5ML IJ SOLN
INTRAMUSCULAR | Status: AC
  Filled 2016-01-09: qty 20

## 2016-01-09 SURGICAL SUPPLY — 115 items
ADAPTER CARDIO PERF ANTE/RETRO (ADAPTER) ×1 IMPLANT
ADH SKN CLS APL DERMABOND .7 (GAUZE/BANDAGES/DRESSINGS) ×2
ADPR PRFSN 84XANTGRD RTRGD (ADAPTER) ×2
APPLIER CLIP 11 MED OPEN (CLIP) ×3
APPLIER CLIP 9.375 SM OPEN (CLIP) ×6
APR CLP MED 11 20 MLT OPN (CLIP) ×2
APR CLP SM 9.3 20 MLT OPN (CLIP) ×4
BAG DECANTER FOR FLEXI CONT (MISCELLANEOUS) ×6 IMPLANT
BANDAGE ACE 4X5 VEL STRL LF (GAUZE/BANDAGES/DRESSINGS) ×1 IMPLANT
BANDAGE ACE 6X5 VEL STRL LF (GAUZE/BANDAGES/DRESSINGS) ×1 IMPLANT
BANDAGE ELASTIC 4 VELCRO ST LF (GAUZE/BANDAGES/DRESSINGS) ×3 IMPLANT
BANDAGE ELASTIC 6 VELCRO ST LF (GAUZE/BANDAGES/DRESSINGS) ×3 IMPLANT
BASKET HEART (ORDER IN 25'S) (MISCELLANEOUS) ×1
BASKET HEART (ORDER IN 25S) (MISCELLANEOUS) ×2 IMPLANT
BLADE 11 SAFETY STRL DISP (BLADE) ×1 IMPLANT
BLADE STERNUM SYSTEM 6 (BLADE) ×3 IMPLANT
BLADE SURG ROTATE 9660 (MISCELLANEOUS) IMPLANT
BNDG GAUZE ELAST 4 BULKY (GAUZE/BANDAGES/DRESSINGS) ×3 IMPLANT
CANISTER SUCTION 2500CC (MISCELLANEOUS) ×3 IMPLANT
CANNULA EZ GLIDE AORTIC 21FR (CANNULA) ×7 IMPLANT
CANNULA GUNDRY RCSP 15FR (MISCELLANEOUS) ×1 IMPLANT
CATH CPB KIT OWEN (MISCELLANEOUS) ×3 IMPLANT
CATH THORACIC 36FR (CATHETERS) ×3 IMPLANT
CLIP APPLIE 11 MED OPEN (CLIP) IMPLANT
CLIP APPLIE 9.375 SM OPEN (CLIP) IMPLANT
CLIP RETRACTION 3.0MM CORONARY (MISCELLANEOUS) ×1 IMPLANT
CLIP TI MEDIUM 24 (CLIP) IMPLANT
CLIP TI WIDE RED SMALL 24 (CLIP) IMPLANT
CONN ST 1/4X3/8  BEN (MISCELLANEOUS) ×1
CONN ST 1/4X3/8 BEN (MISCELLANEOUS) IMPLANT
CRADLE DONUT ADULT HEAD (MISCELLANEOUS) ×3 IMPLANT
DERMABOND ADVANCED (GAUZE/BANDAGES/DRESSINGS) ×1
DERMABOND ADVANCED .7 DNX12 (GAUZE/BANDAGES/DRESSINGS) IMPLANT
DRAIN CHANNEL 32F RND 10.7 FF (WOUND CARE) ×6 IMPLANT
DRAPE CARDIOVASCULAR INCISE (DRAPES) ×3
DRAPE INCISE IOBAN 66X45 STRL (DRAPES) ×3 IMPLANT
DRAPE SLUSH/WARMER DISC (DRAPES) ×3 IMPLANT
DRAPE SRG 135X102X78XABS (DRAPES) ×2 IMPLANT
DRSG AQUACEL AG ADV 3.5X14 (GAUZE/BANDAGES/DRESSINGS) ×1 IMPLANT
DRSG COVADERM 4X10 (GAUZE/BANDAGES/DRESSINGS) ×1 IMPLANT
DRSG COVADERM 4X14 (GAUZE/BANDAGES/DRESSINGS) ×3 IMPLANT
DRSG KUZMA FLUFF (GAUZE/BANDAGES/DRESSINGS) ×1 IMPLANT
ELECT BLADE 4.0 EZ CLEAN MEGAD (MISCELLANEOUS) ×3
ELECT REM PT RETURN 9FT ADLT (ELECTROSURGICAL) ×6
ELECTRODE BLDE 4.0 EZ CLN MEGD (MISCELLANEOUS) IMPLANT
ELECTRODE REM PT RTRN 9FT ADLT (ELECTROSURGICAL) ×4 IMPLANT
FELT TEFLON 1X6 (MISCELLANEOUS) ×3 IMPLANT
GAUZE SPONGE 4X4 12PLY STRL (GAUZE/BANDAGES/DRESSINGS) ×6 IMPLANT
GLOVE ORTHO TXT STRL SZ7.5 (GLOVE) ×6 IMPLANT
GOWN STRL REUS W/ TWL LRG LVL3 (GOWN DISPOSABLE) ×8 IMPLANT
GOWN STRL REUS W/TWL LRG LVL3 (GOWN DISPOSABLE) ×12
HEMOSTAT POWDER SURGIFOAM 1G (HEMOSTASIS) ×9 IMPLANT
INSERT FOGARTY XLG (MISCELLANEOUS) ×3 IMPLANT
KIT BASIN OR (CUSTOM PROCEDURE TRAY) ×3 IMPLANT
KIT ROOM TURNOVER OR (KITS) ×3 IMPLANT
KIT SUCTION CATH 14FR (SUCTIONS) ×9 IMPLANT
KIT VASOVIEW W/TROCAR VH 2000 (KITS) ×3 IMPLANT
LEAD PACING MYOCARDI (MISCELLANEOUS) ×3 IMPLANT
MARKER GRAFT CORONARY BYPASS (MISCELLANEOUS) ×9 IMPLANT
NS IRRIG 1000ML POUR BTL (IV SOLUTION) ×15 IMPLANT
PACK OPEN HEART (CUSTOM PROCEDURE TRAY) ×3 IMPLANT
PAD ARMBOARD 7.5X6 YLW CONV (MISCELLANEOUS) ×6 IMPLANT
PAD ELECT DEFIB RADIOL ZOLL (MISCELLANEOUS) ×3 IMPLANT
PENCIL BUTTON HOLSTER BLD 10FT (ELECTRODE) ×3 IMPLANT
PUNCH AORTIC ROT 4.0MM RCL 40 (MISCELLANEOUS) ×1 IMPLANT
PUNCH AORTIC ROTATE 4.0MM (MISCELLANEOUS) IMPLANT
PUNCH AORTIC ROTATE 4.5MM 8IN (MISCELLANEOUS) IMPLANT
PUNCH AORTIC ROTATE 5MM 8IN (MISCELLANEOUS) IMPLANT
SET CARDIOPLEGIA MPS 5001102 (MISCELLANEOUS) ×1 IMPLANT
SOLUTION ANTI FOG 6CC (MISCELLANEOUS) ×1 IMPLANT
SPONGE LAP 18X18 X RAY DECT (DISPOSABLE) ×2 IMPLANT
SPONGE LAP 4X18 X RAY DECT (DISPOSABLE) ×1 IMPLANT
SUT BONE WAX W31G (SUTURE) ×3 IMPLANT
SUT ETHIBOND X763 2 0 SH 1 (SUTURE) ×6 IMPLANT
SUT MNCRL AB 3-0 PS2 18 (SUTURE) ×6 IMPLANT
SUT MNCRL AB 4-0 PS2 18 (SUTURE) ×4 IMPLANT
SUT PDS AB 1 CTX 36 (SUTURE) ×6 IMPLANT
SUT PROLENE 2 0 SH DA (SUTURE) IMPLANT
SUT PROLENE 3 0 SH DA (SUTURE) ×3 IMPLANT
SUT PROLENE 3 0 SH1 36 (SUTURE) IMPLANT
SUT PROLENE 4 0 RB 1 (SUTURE)
SUT PROLENE 4 0 SH DA (SUTURE) ×1 IMPLANT
SUT PROLENE 4-0 RB1 .5 CRCL 36 (SUTURE) IMPLANT
SUT PROLENE 5 0 C 1 36 (SUTURE) IMPLANT
SUT PROLENE 6 0 C 1 30 (SUTURE) ×5 IMPLANT
SUT PROLENE 7.0 RB 3 (SUTURE) ×9 IMPLANT
SUT PROLENE 8 0 BV175 6 (SUTURE) ×9 IMPLANT
SUT PROLENE BLUE 7 0 (SUTURE) ×3 IMPLANT
SUT PROLENE POLY MONO (SUTURE) IMPLANT
SUT SILK  1 MH (SUTURE) ×2
SUT SILK 1 MH (SUTURE) ×2 IMPLANT
SUT SILK 3 0 SH CR/8 (SUTURE) ×1 IMPLANT
SUT STEEL 6MS V (SUTURE) IMPLANT
SUT STEEL STERNAL CCS#1 18IN (SUTURE) ×1 IMPLANT
SUT STEEL SZ 6 DBL 3X14 BALL (SUTURE) ×2 IMPLANT
SUT VIC AB 1 CTX 36 (SUTURE)
SUT VIC AB 1 CTX36XBRD ANBCTR (SUTURE) IMPLANT
SUT VIC AB 2-0 CT1 27 (SUTURE) ×12
SUT VIC AB 2-0 CT1 TAPERPNT 27 (SUTURE) IMPLANT
SUT VIC AB 2-0 CTX 27 (SUTURE) IMPLANT
SUT VIC AB 3-0 SH 27 (SUTURE)
SUT VIC AB 3-0 SH 27X BRD (SUTURE) IMPLANT
SUT VIC AB 3-0 X1 27 (SUTURE) IMPLANT
SUT VICRYL 4-0 PS2 18IN ABS (SUTURE) IMPLANT
SUTURE E-PAK OPEN HEART (SUTURE) ×3 IMPLANT
SYSTEM SAHARA CHEST DRAIN ATS (WOUND CARE) ×3 IMPLANT
TAPE CLOTH SURG 4X10 WHT LF (GAUZE/BANDAGES/DRESSINGS) ×1 IMPLANT
TAPE PAPER 3X10 WHT MICROPORE (GAUZE/BANDAGES/DRESSINGS) ×1 IMPLANT
TOWEL OR 17X24 6PK STRL BLUE (TOWEL DISPOSABLE) ×6 IMPLANT
TOWEL OR 17X26 10 PK STRL BLUE (TOWEL DISPOSABLE) ×6 IMPLANT
TRAY FOLEY IC TEMP SENS 16FR (CATHETERS) ×3 IMPLANT
TUBING INSUFFLATION (TUBING) ×3 IMPLANT
UNDERPAD 30X30 INCONTINENT (UNDERPADS AND DIAPERS) ×3 IMPLANT
WATER STERILE IRR 1000ML POUR (IV SOLUTION) ×6 IMPLANT
YANKAUER SUCT BULB TIP NO VENT (SUCTIONS) ×1 IMPLANT

## 2016-01-09 NOTE — Progress Notes (Signed)
RT NOTE:  Rapid Wean started

## 2016-01-09 NOTE — Progress Notes (Signed)
RT NOTE:  Rapid Wean started @ 1934. Pt is not taking deep breaths, RT explained to patient the most beneficial breathing technique to pass wean. The patient agrees he is in pain and taking slow deep breaths makes pain worse. Pt takes 5-6 short, shallow breaths (150-149ms) then a deep sigh (750-8047m). RT will continue to work with patient.

## 2016-01-09 NOTE — Progress Notes (Signed)
RT NOTE:  CPAP/PS started

## 2016-01-09 NOTE — Progress Notes (Signed)
UR Completed. Tresa Jolley, RN, BSN.  [phone] 

## 2016-01-09 NOTE — Brief Op Note (Addendum)
01/09/2016  12:14 PM  PATIENT:  [name] [name]  60 y.o. male  PRE-OPERATIVE DIAGNOSIS:  CAD  POST-OPERATIVE DIAGNOSIS:  CAD  PROCEDURE:  Procedure(s):  CORONARY ARTERY BYPASS GRAFTING x 3 -LIMA to LAD -FREE RIMA to OM2 -SVG to PDA   -OPEN HARVEST GREATER SAPHENOUS VEIN  -Right Thigh -Left leg explored, no conduit removed  TRANSESOPHAGEAL ECHOCARDIOGRAM (TEE) (N/A)   SURGEON:    Rexene Alberts, MD  ASSISTANTS:  Ellwood Handler, PA-C  ANESTHESIA:   Albertha Ghee, MD  CROSSCLAMP TIME:   5'  CARDIOPULMONARY BYPASS TIME: 120'  FINDINGS:  Normal LV systolic function  Tricuspid aortic valve with mild-moderate (2+) central aortic insufficiency  Good quality LIMA and RIMA conduit for grafting  Relatively small, superficial, but otherwise good quality SVG conduit for grafting  Diffuse CAD with fair quality target vessels for grafting  COMPLICATIONS: None  BASELINE WEIGHT: 103 kg  PATIENT DISPOSITION:   TO SICU IN STABLE CONDITION  Rexene Alberts, MD 01/09/2016 1:54 PM

## 2016-01-09 NOTE — Progress Notes (Signed)
  Echocardiogram Echocardiogram Transesophageal has been performed.  [name] [name] M 01/09/2016, 8:21 AM

## 2016-01-09 NOTE — Anesthesia Preprocedure Evaluation (Signed)
Anesthesia Evaluation  Patient identified by MRN, date of birth, ID band Patient awake    Reviewed: Allergy & Precautions, NPO status , Patient's Chart, lab work & pertinent test results  Airway Mallampati: II   Neck ROM: full    Dental   Pulmonary asthma , sleep apnea ,    breath sounds clear to auscultation       Cardiovascular hypertension, + angina + CAD, + Past MI and + Cardiac Stents   Rhythm:regular Rate:Normal     Neuro/Psych  Headaches, Anxiety    GI/Hepatic hiatal hernia, GERD  ,  Endo/Other  diabetes, Type 2  Renal/GU      Musculoskeletal  (+) Arthritis ,   Abdominal   Peds  Hematology   Anesthesia Other Findings   Reproductive/Obstetrics                             Anesthesia Physical Anesthesia Plan  ASA: III  Anesthesia Plan: General   Post-op Pain Management:    Induction: Intravenous  Airway Management Planned: Oral ETT  Additional Equipment: Arterial line, CVP, PA Cath, TEE and Ultrasound Guidance Line Placement  Intra-op Plan:   Post-operative Plan: Post-operative intubation/ventilation  Informed Consent: I have reviewed the patients History and Physical, chart, labs and discussed the procedure including the risks, benefits and alternatives for the proposed anesthesia with the patient or authorized representative who has indicated his/her understanding and acceptance.     Plan Discussed with: CRNA, Anesthesiologist and Surgeon  Anesthesia Plan Comments:         Anesthesia Quick Evaluation

## 2016-01-09 NOTE — Transfer of Care (Signed)
Immediate Anesthesia Transfer of Care Note  Patient: [name] [name]  Procedure(s) Performed: Procedure(s): CORONARY ARTERY BYPASS GRAFTING (CABG) X 3 UTILIZING RIGHT AND LEFT INTERNAL MAMMARY ARTERY AND ENDOSCOPICALLY HARVESTED SAPHENEOUS VEIN. (N/A) TRANSESOPHAGEAL ECHOCARDIOGRAM (TEE) (N/A)  Patient Location: SICU  Anesthesia Type:General  Level of Consciousness: sedated and unresponsive  Airway & Oxygen Therapy: Patient remains intubated per anesthesia plan and Patient placed on Ventilator (see vital sign flow sheet for setting)  Post-op Assessment: Report given to RN and Post -op Vital signs reviewed and stable  Post vital signs: Reviewed and stable  Last Vitals:  Filed Vitals:   01/09/16 0600 01/09/16 1430  BP:  99/68  Pulse:  91  Temp: 36.9 C   Resp:  15    Complications: No apparent anesthesia complications

## 2016-01-09 NOTE — Op Note (Signed)
CARDIOTHORACIC SURGERY OPERATIVE NOTE  Date of Procedure: 01/09/2016  Preoperative Diagnosis:   Severe 3-vessel Coronary Artery Disease  Unstable Angina Pectoris  Postoperative Diagnosis: Same  Procedure:    Coronary Artery Bypass Grafting x 3   Left Internal Mammary Artery to Distal Left Anterior Descending Coronary Artery  Free Right Internal Mammary Artery to Second Obtuse Marginal Branch of Left Circumflex Coronary Artery  Saphenous Vein Graft to Posterior Descending Coronary Artery  Open Vein Harvest from Right Thigh, Exploration of Left Thigh without Harvest of Conduit  Surgeon: [name] Gu. Roxy Manns, MD  Assistant: Ellwood Handler, PA-C  Anesthesia: Albertha Ghee, MD  Operative Findings:  Normal LV systolic function with mild to moderate LV hypertrophy  Tricuspid aortic valve with mild-moderate (2+) central aortic insufficiency  Good quality LIMA and RIMA conduit for grafting  Relatively small, superficial, but otherwise good quality SVG conduit for grafting  Diffuse CAD with fair quality target vessels for grafting    BRIEF CLINICAL NOTE AND INDICATIONS FOR SURGERY  Patient is a 60 year old male with known history of coronary artery disease, hypertension, hypercholesterolemia, type 2 diabetes mellitus, obstructive sleep apnea, GE reflux disease, asthma, and TIAs who was admitted to the hospital with symptoms consistent with unstable angina and has been referred for surgical consultation to discuss treatment options for management of multivessel coronary artery disease. Patient's cardiac history dates back approximately 15 years ago when he first presented with symptoms of angina pectoris. He was treated with PCI and stenting at that time. In January 2016 he presented with unstable angina and underwent PCI with placement of drug eluding stents in the proximal left anterior descending coronary artery and the first obtuse marginal branch of the left circumflex coronary artery.  He has been followed for the last several years by Dr. Domenic Polite through the [facility] office in [place]. The patient states that he remained stable until last week when he developed an episode of substernal chest pressure associated with dyspnea, diaphoresis, and nausea while he was working at [employer]. He was observed overnight in the emergency department and ruled out by serial cardiac enzymes. He was instructed to follow-up with his primary cardiologist, but symptoms recurred 2 days later and the patient was readmitted to the hospital. Chest pain resolved prior to admission following administration of aspirin and sublingual nitroglycerin by EMS. Baseline electrocardiogram reveals sinus rhythm without ST segment changes and the patient has ruled out for acute myocardial infarction. Diagnostic cardiac catheterization performed by Dr. Alvester Chou demonstrates severe multivessel coronary artery disease with in-stent restenosis in the distal portion of the stents in the left anterior descending coronary artery and severe progression of disease in the distal left anterior descending coronary artery. The patient also has 70-80% stenosis in the left circumflex system with nonobstructive disease in the right coronary artery distribution. Left ventricular systolic function is preserved. Initially medical therapy was initially recommended, but surgical consultation was subsequently requested. While in the hospital the patient continued to have frequent episodes of exertional angina despite maximal medical therapy. The patient has been seen in consultation and counseled at length regarding the indications, risks and potential benefits of surgery.  All questions have been answered, and the patient provides full informed consent for the operation as described.    DETAILS OF THE OPERATIVE PROCEDURE  Preparation:  The patient is brought to the operating room on the above mentioned date and central monitoring was established  by the anesthesia team including placement of Swan-Ganz catheter and radial arterial line. The patient  is placed in the supine position on the operating table.  Intravenous antibiotics are administered. General endotracheal anesthesia is induced uneventfully. A Foley catheter is placed.  Baseline transesophageal echocardiogram was performed.  Findings were notable for Normal left ventricular systolic function with mild-to-moderate left ventricular hypertrophy. The aortic valve was tricuspid. There was mild-to-moderate (1+) central aortic insufficiency. There was trivial mitral regurgitation. No other abnormalities were noted.  The patient's chest, abdomen, both groins, and both lower extremities are prepared and draped in a sterile manner. A time out procedure is performed.   Surgical Approach and Conduit Harvest:  A median sternotomy incision was performed and the left internal mammary artery is dissected from the chest wall and prepared for bypass grafting. The left internal mammary artery is notably good quality conduit. Simultaneously, the distal right thigh is explored with plans to proceed with saphenous vein harvest using endoscopic vein harvest technique. However, the distal portion of the greater saphenous vein was never identified. Subsequently an additional incision is made just below the knee on the right side. The saphenous vein is not localized. Two additional incisions are made on the left lower extremity both above and below the left knee. The greater saphenous vein is never identified through these incisions. Ultimately, an incision is made in the right groin just below the femoral triangle. The greater saphenous vein is normal. The greater saphenous vein is removed from the patient's right thigh using open vein harvest technique through a series of longitudinal incisions. The vein becomes quite superficial as it progresses down the thigh. He becomes somewhat small caliber as it gets close  to the knee. Subsequently, the right internal mammary artery is harvested from the chest wall and prepared for bypass grafting. The right internal mammary artery is transected proximally after systemic embolization to be utilized as a free graft. After removal of the saphenous vein, the small surgical incisions in the lower extremity are closed with absorbable suture. Following systemic heparinization, the left internal mammary artery was transected distally noted to have excellent flow.   Extracorporeal Cardiopulmonary Bypass and Myocardial Protection:  The pericardium is opened. The ascending aorta is normal in appearance. The ascending aorta and the right atrium are cannulated for cardiopulmonary bypass.  Adequate heparinization is verified.    A retrograde cardioplegia cannula is placed through the right atrium into the coronary sinus.  The entire pre-bypass portion of the operation was notable for stable hemodynamics.  Cardiopulmonary bypass was begun and the surface of the heart is inspected. Distal target vessels are selected for coronary artery bypass grafting. A cardioplegia cannula is placed in the ascending aorta.  A temperature probe was placed in the interventricular septum.  The patient is allowed to cool passively to [facility] systemic temperature.  The aortic cross clamp is applied and cold blood cardioplegia is delivered initially in an antegrade fashion through the aortic root.   Supplemental cardioplegia is given retrograde through the coronary sinus catheter.  Iced saline slush is applied for topical hypothermia.  The initial cardioplegic arrest is rapid with early diastolic arrest.  Repeat doses of cardioplegia are administered intermittently throughout the entire cross clamp portion of the operation through the aortic root, through the coronary sinus catheter, and through subsequently placed vein grafts in order to maintain completely flat electrocardiogram and septal myocardial  temperature below 15C.  Myocardial protection was felt to be excellent.  Coronary Artery Bypass Grafting:   The posterior descending branch of the right coronary artery was grafted using a reversed  saphenous vein graft in an end-to-side fashion.  The posterior descending coronary artery is diffusely diseased. The distal site of anastomosis is relatively far distal on the vessel.  At the site of distal anastomosis the target vessel was fair quality and measured approximately 1.5 mm in diameter.  The second obtuse marginal branch of the left circumflex coronary artery was grafted using the right internal mammary artery in an end-to-side fashion.  The right internal mammary artery was utilized as a free graft. The second obtuse marginal branch was diffusely diseased proximally.  At the site of distal anastomosis the target vessel was fair quality and measured approximately 2.0 mm in diameter.  The distal left anterior coronary artery was grafted with the left internal mammary artery in an end-to-side fashion.  The left anterior descending coronary artery was diffusely diseased and filled with numerous overlapping stents. The distal site of anastomosis was quite far distal on the vessel.  At the site of distal anastomosis the target vessel was fair quality and measured approximately 1.5 mm in diameter.  The single proximal vein graft anastomosis was placed directly to the ascending aorta prior to removal of the aortic cross clamp.  A second portion of greater saphenous vein is sewn directly to the ascending aorta to be utilized to construct a very short interposition portion of vein for construction of the proximal anastomosis for the right internal mammary artery graft.  The septal myocardial temperature rose rapidly after reperfusion of the left internal mammary artery graft.  The aortic cross clamp was removed after a total cross clamp time of 82 minutes.  The proximal end of the free right internal  mammary artery graft was sewn in an end to end fashion to the short segment interposition portion of her saphenous vein at the ascending aorta.   Procedure Completion:  All proximal and distal coronary anastomoses were inspected for hemostasis and appropriate graft orientation. Epicardial pacing wires are fixed to the right ventricular outflow tract and to the right atrial appendage. The patient is rewarmed to 37C temperature. The patient is weaned and disconnected from cardiopulmonary bypass.  The patient's rhythm at separation from bypass was sinus.  The patient was weaned from cardiopulmonary bypass without any inotropic support. Total cardiopulmonary bypass time for the operation was 120 minutes.  Followup transesophageal echocardiogram performed after separation from bypass revealed no changes from the preoperative exam.  The aortic and venous cannula were removed uneventfully. Protamine was administered to reverse the anticoagulation. The mediastinum and pleural space were inspected for hemostasis and irrigated with saline solution. The mediastinum and both pleural spaces were drained using 4 chest tubes placed through separate stab incisions inferiorly.  The soft tissues anterior to the aorta were reapproximated loosely. The sternum is closed with double strength sternal wire. The soft tissues anterior to the sternum were closed in multiple layers and the skin is closed with a running subcuticular skin closure.  The post-bypass portion of the operation was notable for stable rhythm and hemodynamics.  No blood products were administered during the operation.   Disposition:  The patient tolerated the procedure well and is transported to the surgical intensive care in stable condition. There are no intraoperative complications. All sponge instrument and needle counts are verified correct at completion of the operation.    [name] Gu. Roxy Manns MD 01/09/2016 1:56 PM

## 2016-01-09 NOTE — Anesthesia Procedure Notes (Addendum)
Central Venous Catheter Insertion Performed by: anesthesiologist 01/09/2016 6:30 AM Patient location: Pre-op. Preanesthetic checklist: patient identified, IV checked, site marked, risks and benefits discussed, surgical consent, monitors and equipment checked, pre-op evaluation, timeout performed and anesthesia consent Position: Trendelenburg Lidocaine 1% used for infiltration Landmarks identified and Seldinger technique used Catheter size: 9 Fr Central line was placed.MAC introducer Swan type and PA catheter depth:thermodilution and 50PA Cath depth:50 Procedure performed using ultrasound guided technique. Attempts: 1 Following insertion, line sutured and dressing applied. Post procedure assessment: blood return through all ports, free fluid flow and no air. Patient tolerated the procedure well with no immediate complications.   Procedure Name: Intubation Date/Time: 01/09/2016 8:01 AM Performed by: Mariea Clonts Pre-anesthesia Checklist: Timeout performed, Suction available, Patient being monitored, Patient identified and Emergency Drugs available Patient Re-evaluated:Patient Re-evaluated prior to inductionOxygen Delivery Method: Circle system utilized Preoxygenation: Pre-oxygenation with 100% oxygen Intubation Type: IV induction Ventilation: Mask ventilation without difficulty and Oral airway inserted - appropriate to patient size Laryngoscope Size: Sabra Heck and 2 Grade View: Grade II Tube type: Oral Tube size: 8.0 mm Number of attempts: 1 Placement Confirmation: ETT inserted through vocal cords under direct vision,  breath sounds checked- equal and bilateral and positive ETCO2 Tube secured with: Tape Dental Injury: Teeth and Oropharynx as per pre-operative assessment

## 2016-01-09 NOTE — OR Nursing (Signed)
SICU Calls:  1st call at 12:57noon; 2nd call at 1321 pm;  3rd call at 1347; and final call at 1416pm.       Marykay Lex Alitza Cowman,RN

## 2016-01-09 NOTE — Care Management Note (Signed)
Case Management Note  Patient Details  Name: [name] [name] MRN: [id number] Date of Birth: [birth year]  Subjective/Objective:  S/p CABG                  Action/Plan:  Pt is from home with wife, wife states she will provide 24 hour supervision when discharge.  CM will continue to monitor for disposition needs   Expected Discharge Date:                  Expected Discharge Plan:  [place]  In-House Referral:     Discharge planning Services  CM Consult  Post Acute Care Choice:    Choice offered to:     DME Arranged:    DME Agency:     HH Arranged:    HH Agency:     Status of Service:  In process, will continue to follow  Medicare Important Message Given:    Date Medicare IM Given:    Medicare IM give by:    Date Additional Medicare IM Given:    Additional Medicare Important Message give by:     If discussed at [place] of Stay Meetings, dates discussed:    Additional Comments:  Maryclare Labrador, RN 01/09/2016, 4:22 PM

## 2016-01-09 NOTE — Interval H&P Note (Signed)
History and Physical Interval Note:  01/09/2016 6:20 AM  [name] [name]  has presented today for surgery, with the diagnosis of CAD  The various methods of treatment have been discussed with the patient and family. After consideration of risks, benefits and other options for treatment, the patient has consented to  Procedure(s): CORONARY ARTERY BYPASS GRAFTING (CABG) (N/A) TRANSESOPHAGEAL ECHOCARDIOGRAM (TEE) (N/A) as a surgical intervention .  The patient's history has been reviewed, patient examined, no change in status, stable for surgery.  I have reviewed the patient's chart and labs.  Questions were answered to the patient's satisfaction.     Rexene Alberts

## 2016-01-09 NOTE — Progress Notes (Signed)
Pt has received 2 doses of nasal mupirocin for positive staph aureus.  Pt needs to receive 8 more post op.  Wife has ointment with her.

## 2016-01-09 NOTE — Progress Notes (Signed)
RT NOTE:  Cardiac Rapid Wean completed successfully. NIF -30, VC 850 mls. Cuff leak present. ABG below normal range, RN calling to get MD ok to extubate.   Dr. Prescott Gum request patient rest on full support until 2300 then attempt Rapid Wean again.

## 2016-01-09 NOTE — Progress Notes (Signed)
After weaning, pt's ABG came back borderline (pH 7.293, pCO2 50.1). Asked Dr. Prescott Gum about what to do, he came and checked pt at 20:40, determined that we should flip back to full support and try again around 23:00. Pt drowsy and taking shallow breaths during weaning.  Will notify RRT and continue to monitor.  Henreitta Leber, RN 8:45 PM 01/09/2016

## 2016-01-09 NOTE — Progress Notes (Signed)
CT surgery p.m. Rounds  Patient still sleeping on ventilator After weaning ventilator PCO2 is 50 Patient has not alert enough for extubation We'll place back on support mode and try to wean again in 2 hours Hemodynamics stable Chest tube drainage acceptable  Tharon Aquas trigt M.D. T CTS

## 2016-01-10 ENCOUNTER — Inpatient Hospital Stay (HOSPITAL_COMMUNITY): Payer: Medicare Other

## 2016-01-10 ENCOUNTER — Encounter (HOSPITAL_COMMUNITY): Payer: Self-pay | Admitting: Thoracic Surgery (Cardiothoracic Vascular Surgery)

## 2016-01-10 LAB — GLUCOSE, CAPILLARY
GLUCOSE-CAPILLARY: 127 mg/dL — AB (ref 65–99)
GLUCOSE-CAPILLARY: 131 mg/dL — AB (ref 65–99)
GLUCOSE-CAPILLARY: 133 mg/dL — AB (ref 65–99)
GLUCOSE-CAPILLARY: 134 mg/dL — AB (ref 65–99)
GLUCOSE-CAPILLARY: 134 mg/dL — AB (ref 65–99)
GLUCOSE-CAPILLARY: 134 mg/dL — AB (ref 65–99)
GLUCOSE-CAPILLARY: 141 mg/dL — AB (ref 65–99)
GLUCOSE-CAPILLARY: 158 mg/dL — AB (ref 65–99)
GLUCOSE-CAPILLARY: 162 mg/dL — AB (ref 65–99)
GLUCOSE-CAPILLARY: 162 mg/dL — AB (ref 65–99)
Glucose-Capillary: 148 mg/dL — ABNORMAL HIGH (ref 65–99)
Glucose-Capillary: 123 mg/dL — ABNORMAL HIGH (ref 65–99)
Glucose-Capillary: 121 mg/dL — ABNORMAL HIGH (ref 65–99)
Glucose-Capillary: 155 mg/dL — ABNORMAL HIGH (ref 65–99)
Glucose-Capillary: 134 mg/dL — ABNORMAL HIGH (ref 65–99)
Glucose-Capillary: 143 mg/dL — ABNORMAL HIGH (ref 65–99)
Glucose-Capillary: 214 mg/dL — ABNORMAL HIGH (ref 65–99)

## 2016-01-10 LAB — POCT I-STAT 3, ART BLOOD GAS (G3+)
ACID-BASE DEFICIT: 2 mmol/L (ref 0.0–2.0)
ACID-BASE DEFICIT: 4 mmol/L — AB (ref 0.0–2.0)
BICARBONATE: 22 meq/L (ref 20.0–24.0)
BICARBONATE: 24.1 meq/L — AB (ref 20.0–24.0)
O2 SAT: 96 %
O2 Saturation: 91 %
PCO2 ART: 44.8 mmHg (ref 35.0–45.0)
PH ART: 7.302 — AB (ref 7.350–7.450)
PO2 ART: 89 mmHg (ref 80.0–100.0)
Patient temperature: 37.5
TCO2: 23 mmol/L (ref 0–100)
TCO2: 25 mmol/L (ref 0–100)
pCO2 arterial: 44.2 mmHg (ref 35.0–45.0)
pH, Arterial: 7.345 — ABNORMAL LOW (ref 7.350–7.450)
pO2, Arterial: 66 mmHg — ABNORMAL LOW (ref 80.0–100.0)

## 2016-01-10 LAB — BASIC METABOLIC PANEL
Anion gap: 10 (ref 5–15)
BUN: 16 mg/dL (ref 6–20)
CHLORIDE: 108 mmol/L (ref 101–111)
CO2: 22 mmol/L (ref 22–32)
Calcium: 8 mg/dL — ABNORMAL LOW (ref 8.9–10.3)
Creatinine, Ser: 1.2 mg/dL (ref 0.61–1.24)
GFR calc Af Amer: >60 mL/min (ref >60)
GFR calc non Af Amer: >60 mL/min (ref >60)
Glucose, Bld: 137 mg/dL — ABNORMAL HIGH (ref 65–99)
Potassium: 4.7 mmol/L (ref 3.5–5.1)
Sodium: 140 mmol/L (ref 135–145)

## 2016-01-10 LAB — POCT I-STAT, CHEM 8
BUN: 24 mg/dL — AB (ref 6–20)
CREATININE: 1.3 mg/dL — AB (ref 0.61–1.24)
Calcium, Ion: 1.21 mmol/L (ref 1.12–1.23)
Chloride: 99 mmol/L — ABNORMAL LOW (ref 101–111)
GLUCOSE: 185 mg/dL — AB (ref 65–99)
HEMATOCRIT: 37 % — AB (ref 39.0–52.0)
Hemoglobin: 12.6 g/dL — ABNORMAL LOW (ref 13.0–17.0)
Potassium: 4.8 mmol/L (ref 3.5–5.1)
Sodium: 140 mmol/L (ref 135–145)
TCO2: 28 mmol/L (ref 0–100)

## 2016-01-10 LAB — CBC
HCT: 36.7 % — ABNORMAL LOW (ref 39.0–52.0)
HEMATOCRIT: 37.1 % — AB (ref 39.0–52.0)
HEMOGLOBIN: 12.8 g/dL — AB (ref 13.0–17.0)
Hemoglobin: 12.4 g/dL — ABNORMAL LOW (ref 13.0–17.0)
MCH: 31 pg (ref 26.0–34.0)
MCH: 30.9 pg (ref 26.0–34.0)
MCHC: 34.5 g/dL (ref 30.0–36.0)
MCHC: 33.8 g/dL (ref 30.0–36.0)
MCV: 89.8 fL (ref 78.0–100.0)
MCV: 91.5 fL (ref 78.0–100.0)
Platelets: 96 10*3/uL — ABNORMAL LOW (ref 150–400)
Platelets: 104 K/uL — ABNORMAL LOW (ref 150–400)
RBC: 4.13 MIL/uL — ABNORMAL LOW (ref 4.22–5.81)
RBC: 4.01 MIL/uL — ABNORMAL LOW (ref 4.22–5.81)
RDW: 13 % (ref 11.5–15.5)
RDW: 13.3 % (ref 11.5–15.5)
WBC: 11 10*3/uL — ABNORMAL HIGH (ref 4.0–10.5)
WBC: 11.5 K/uL — ABNORMAL HIGH (ref 4.0–10.5)

## 2016-01-10 LAB — MAGNESIUM
MAGNESIUM: 2.5 mg/dL — AB (ref 1.7–2.4)
Magnesium: 2.5 mg/dL — ABNORMAL HIGH (ref 1.7–2.4)

## 2016-01-10 LAB — CREATININE, SERUM
Creatinine, Ser: 1.4 mg/dL — ABNORMAL HIGH (ref 0.61–1.24)
GFR calc Af Amer: >60 mL/min
GFR calc non Af Amer: 54 mL/min — ABNORMAL LOW

## 2016-01-10 MED ORDER — FLUTICASONE FUROATE-VILANTEROL 200-25 MCG/INH IN AEPB
1.0000 | INHALATION_SPRAY | Freq: Every day | RESPIRATORY_TRACT | Status: DC
  Administered 2016-01-11: 1 via RESPIRATORY_TRACT
  Filled 2016-01-10: qty 28

## 2016-01-10 MED ORDER — INSULIN DETEMIR 100 UNIT/ML ~~LOC~~ SOLN
20.0000 [IU] | Freq: Once | SUBCUTANEOUS | Status: AC
  Administered 2016-01-10: 20 [IU] via SUBCUTANEOUS
  Filled 2016-01-10: qty 0.2

## 2016-01-10 MED ORDER — CITALOPRAM HYDROBROMIDE 20 MG PO TABS
20.0000 mg | ORAL_TABLET | Freq: Every day | ORAL | Status: DC
  Administered 2016-01-10 – 2016-01-13 (×4): 20 mg via ORAL
  Filled 2016-01-10 (×4): qty 1

## 2016-01-10 MED ORDER — MORPHINE SULFATE (PF) 2 MG/ML IV SOLN
1.0000 mg | INTRAVENOUS | Status: DC | PRN
Start: 2016-01-10 — End: 2016-01-11
  Administered 2016-01-10 (×3): 2 mg via INTRAVENOUS
  Filled 2016-01-10 (×3): qty 1

## 2016-01-10 MED ORDER — METOPROLOL TARTRATE 12.5 MG HALF TABLET
12.5000 mg | ORAL_TABLET | Freq: Two times a day (BID) | ORAL | Status: DC
  Administered 2016-01-10 – 2016-01-14 (×9): 12.5 mg via ORAL
  Filled 2016-01-10 (×9): qty 1

## 2016-01-10 MED ORDER — CLOPIDOGREL BISULFATE 75 MG PO TABS
75.0000 mg | ORAL_TABLET | Freq: Every day | ORAL | Status: DC
  Administered 2016-01-11 – 2016-01-14 (×4): 75 mg via ORAL
  Filled 2016-01-10 (×4): qty 1

## 2016-01-10 MED ORDER — INSULIN ASPART 100 UNIT/ML ~~LOC~~ SOLN
0.0000 [IU] | SUBCUTANEOUS | Status: DC
  Administered 2016-01-10: 2 [IU] via SUBCUTANEOUS
  Administered 2016-01-10: 8 [IU] via SUBCUTANEOUS
  Administered 2016-01-10 – 2016-01-11 (×2): 2 [IU] via SUBCUTANEOUS
  Administered 2016-01-11 (×2): 4 [IU] via SUBCUTANEOUS

## 2016-01-10 MED ORDER — FUROSEMIDE 10 MG/ML IJ SOLN
20.0000 mg | Freq: Once | INTRAMUSCULAR | Status: AC
  Administered 2016-01-10: 20 mg via INTRAVENOUS

## 2016-01-10 MED ORDER — GABAPENTIN 400 MG PO CAPS
400.0000 mg | ORAL_CAPSULE | Freq: Three times a day (TID) | ORAL | Status: DC
  Administered 2016-01-10 – 2016-01-14 (×12): 400 mg via ORAL
  Filled 2016-01-10 (×13): qty 1

## 2016-01-10 MED ORDER — KETOROLAC TROMETHAMINE 15 MG/ML IJ SOLN
15.0000 mg | Freq: Four times a day (QID) | INTRAMUSCULAR | Status: DC
  Administered 2016-01-10 (×3): 15 mg via INTRAVENOUS
  Filled 2016-01-10 (×4): qty 1

## 2016-01-10 MED ORDER — INSULIN DETEMIR 100 UNIT/ML ~~LOC~~ SOLN
20.0000 [IU] | Freq: Two times a day (BID) | SUBCUTANEOUS | Status: DC
  Administered 2016-01-10 – 2016-01-11 (×2): 20 [IU] via SUBCUTANEOUS
  Filled 2016-01-10 (×3): qty 0.2

## 2016-01-10 MED ORDER — ALBUTEROL SULFATE (2.5 MG/3ML) 0.083% IN NEBU: 2.5000 mg | INHALATION_SOLUTION | Freq: Four times a day (QID) | RESPIRATORY_TRACT | Status: DC | PRN

## 2016-01-10 MED FILL — Sodium Bicarbonate IV Soln 8.4%: INTRAVENOUS | Qty: 50 | Status: AC

## 2016-01-10 MED FILL — Mannitol IV Soln 20%: INTRAVENOUS | Qty: 500 | Status: AC

## 2016-01-10 MED FILL — Heparin Sodium (Porcine) Inj 1000 Unit/ML: INTRAMUSCULAR | Qty: 10 | Status: AC

## 2016-01-10 MED FILL — Electrolyte-R (PH 7.4) Solution: INTRAVENOUS | Qty: 6000 | Status: AC

## 2016-01-10 MED FILL — Lidocaine HCl IV Inj 20 MG/ML: INTRAVENOUS | Qty: 5 | Status: AC

## 2016-01-10 MED FILL — Sodium Chloride IV Soln 0.9%: INTRAVENOUS | Qty: 2000 | Status: AC

## 2016-01-10 NOTE — Anesthesia Postprocedure Evaluation (Signed)
Anesthesia Post Note  Patient: [name] [name]  Procedure(s) Performed: Procedure(s) (LRB): CORONARY ARTERY BYPASS GRAFTING (CABG) X 3 UTILIZING RIGHT AND LEFT INTERNAL MAMMARY ARTERY AND ENDOSCOPICALLY HARVESTED SAPHENEOUS VEIN. (N/A) TRANSESOPHAGEAL ECHOCARDIOGRAM (TEE) (N/A)  Patient location during evaluation: ICU Anesthesia Type: General Level of consciousness: patient remains intubated per anesthesia plan Pain management: pain level controlled Vital Signs Assessment: post-procedure vital signs reviewed and stable Respiratory status: patient remains intubated per anesthesia plan Cardiovascular status: stable Anesthetic complications: no    Last Vitals:  Filed Vitals:   01/10/16 0845 01/10/16 0900  BP:  123/77  Pulse: 90 90  Temp:    Resp: 19 15    Last Pain:  Filed Vitals:   01/10/16 0940  PainSc: Saline

## 2016-01-10 NOTE — Progress Notes (Addendum)
[address]       ,[place] [id number]             [phone]        CARDIOTHORACIC SURGERY PROGRESS NOTE   R1 Day Post-Op Procedure(s) (LRB): CORONARY ARTERY BYPASS GRAFTING (CABG) X 3 UTILIZING RIGHT AND LEFT INTERNAL MAMMARY ARTERY AND ENDOSCOPICALLY HARVESTED SAPHENEOUS VEIN. (N/A) TRANSESOPHAGEAL ECHOCARDIOGRAM (TEE) (N/A)  Subjective: Looks good.  Feels sore in chest  Objective: Vital signs: BP Readings from Last 1 Encounters:  01/10/16 108/73   Pulse Readings from Last 1 Encounters:  01/10/16 87   Resp Readings from Last 1 Encounters:  01/10/16 19   Temp Readings from Last 1 Encounters:  01/10/16 98.6 F (37 C)     Hemodynamics: PAP: (20-39)/(3-29) 37/26 mmHg CO:  [3.2 L/min-5.8 L/min] 5.8 L/min CI:  [1.5 L/min/m2-2.6 L/min/m2] 2.6 L/min/m2  Physical Exam:  Rhythm:   sinus  Breath sounds: clear  Heart sounds:  RRR  Incisions:  Dressings dry, intact  Abdomen:  Soft, non-distended, non-tender  Extremities:  Warm, well-perfused  Chest tubes:  Low volume thin serosanguinous output, no air leak    Intake/Output from previous day: 03/02 0701 - 03/03 0700 In: 6224.9 [I.V.:4904.9; Blood:600; IV MVHQIONGE:952] Out: 8413 [Urine:2375; Blood:2100; Chest Tube:489] Intake/Output this shift:    Lab Results:  CBC: Recent Labs  01/09/16 2020 01/09/16 2033 01/10/16 0435  WBC 10.0  --  11.0*  HGB 13.6 13.6 12.8*  HCT 38.3* 40.0 37.1*  PLT 101*  --  96*    BMET:  Recent Labs  01/07/16 1340  01/09/16 2033 01/10/16 0435  NA 137  < > 141 140  K 4.5  < > 4.8 4.7  CL 101  < > 105 108  CO2 26  --   --  22  GLUCOSE 198*  < > 124* 137*  BUN 16  < > 16 16  CREATININE 1.19  < > 1.00 1.20  CALCIUM 9.6  --   --  8.0*  < > = values in this interval not displayed.   PT/INR:   Recent Labs  01/09/16 1440  LABPROT 17.1*  INR 1.38    CBG (last 3)   Recent Labs  01/10/16 0353 01/10/16 0458 01/10/16 0603  GLUCAP 123* 121* 134*      ABG    Component Value Date/Time   PHART 7.345* 01/10/2016 0219   PCO2ART 44.2 01/10/2016 0219   PO2ART 66.0* 01/10/2016 0219   HCO3 24.1* 01/10/2016 0219   TCO2 25 01/10/2016 0219   ACIDBASEDEF 2.0 01/10/2016 0219   O2SAT 91.0 01/10/2016 0219    CXR: PORTABLE CHEST 1 VIEW  COMPARISON: 01/09/2016.  FINDINGS: Interim extubation. Swan-Ganz catheter, mediastinal drainage catheter, bilateral chest tubes in stable position. No pneumothorax on today's exam. Stable cardiomegaly. Low lung volumes with mild bibasilar atelectasis.  IMPRESSION: 1. Interim extubation. Remaining lines and tubes including bilateral chest tubes in stable position. No pneumothorax noted on today's exam. 2. Prior CABG. Stable cardiomegaly. 3. Low lung volumes with bibasilar atelectasis .   Electronically Signed  By: Marcello Moores Register  On: 01/10/2016 07:33   EKG: NSR w/out acute ischemic changes, old inferior MI    Assessment/Plan: S/P Procedure(s) (LRB): CORONARY ARTERY BYPASS GRAFTING (CABG) X 3 UTILIZING RIGHT AND LEFT INTERNAL MAMMARY ARTERY AND ENDOSCOPICALLY HARVESTED SAPHENEOUS VEIN. (N/A) TRANSESOPHAGEAL ECHOCARDIOGRAM (TEE) (N/A)  Doing well POD1 Maintaining NSR w/ stable hemodynamics, no drips Expected post op acute blood loss anemia, mild, stable  Expected post op atelectasis, mild Expected post op volume excess, mild Type II diabetes mellitus, excellent glycemic control on insulin drip Post op thrombocytopenia, mild Obesity OSA   Mobilize  Diuresis  D/C lines  D/C tubes later today or am tomorrow depending on output  Add toradol for pain mainagement  Add levemir insulin and wean drip off  Restart Celexa, Neurontin and Plavix  Patient has been intolerant of statins - will need Cardiology to address plan for lipid Rx prior to hospital discharge  Possible transfer step down later today    Rexene Alberts, MD 01/10/2016 7:50 AM

## 2016-01-10 NOTE — Plan of Care (Signed)
Problem: Activity: Goal: Risk for activity intolerance will decrease Outcome: Progressing Up to chair for approximately 2 1/2 -3 hours.  Problem: Cardiac: Goal: Hemodynamic stability will improve Outcome: Completed/Met Date Met:  01/10/16 Off pressors and CI 1.9-2.  Problem: Education: Goal: Knowledge of the prescribed therapeutic regimen will improve Outcome: Progressing Educated on removal of  Swan sleeve, foley catheter, arterial line and chest tubes.  Problem: Respiratory: Goal: Levels of oxygenation will improve Outcome: Progressing O2 decreased to 1L/ min nasal cannula. Only able to do 100 on IS. Obtained flutter valve and patient is able to use without difficulty.

## 2016-01-10 NOTE — Progress Notes (Signed)
Patient ID: [name] [name], male   DOB: [birth year], 58 y.o.   MRN: [id number]   SICU Evening Rounds:   Hemodynamically stable   Ambulating well   CBC    Component Value Date/Time   WBC 11.5* 01/10/2016 1700   RBC 4.01* 01/10/2016 1700   HGB 12.6* 01/10/2016 1711   HCT 37.0* 01/10/2016 1711   PLT 104* 01/10/2016 1700   MCV 91.5 01/10/2016 1700   MCH 30.9 01/10/2016 1700   MCHC 33.8 01/10/2016 1700   RDW 13.3 01/10/2016 1700   LYMPHSABS 1.4 10/29/2015 1617   MONOABS 0.2 10/29/2015 1617   EOSABS 0.1 10/29/2015 1617   BASOSABS 0.0 10/29/2015 1617     BMET    Component Value Date/Time   NA 140 01/10/2016 1711   K 4.8 01/10/2016 1711   CL 99* 01/10/2016 1711   CO2 22 01/10/2016 0435   GLUCOSE 185* 01/10/2016 1711   BUN 24* 01/10/2016 1711   CREATININE 1.30* 01/10/2016 1711   CALCIUM 8.0* 01/10/2016 0435   GFRNONAA 54* 01/10/2016 1700   GFRAA >60 01/10/2016 1700     A/P:  Stable postop course. Continue current plans

## 2016-01-10 NOTE — Discharge Summary (Signed)
Physician Discharge Summary  Patient ID: [name] [name] MRN: [id number] DOB/AGE: 1956-05-21 60 y.o.  Admit date: 01/09/2016 Discharge date: 01/14/2016  Admission Diagnoses:  Patient Active Problem List   Diagnosis Date Noted  . Coronary artery disease involving native coronary artery with unstable angina pectoris ([place])   . CAD -S/P PCI LAD OM/LAD '13 and 2016 12/31/2015  . Hyperlipidemia   . Chest pain with high risk of acute coronary syndrome 12/23/2015  . Precordial chest pain 12/03/2014  . TIA (transient ischemic attack)   . CAD- diffuse LAD disease at cath 12/30/15   . Thrombocytopenia ([place])   . Type 2 diabetes mellitus ([place])   . Anxiety   . PVC's (premature ventricular contractions) 01/06/2013  . Obstructive sleep apnea-declines C-pap 07/04/2012  . Mixed hyperlipidemia 07/30/2009  . Essential hypertension, benign 07/30/2009   Discharge Diagnoses:   Patient Active Problem List   Diagnosis Date Noted  . 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 Coronary artery disease involving native coronary artery with unstable angina pectoris ([place])   . CAD -S/P PCI LAD OM/LAD '13 and 2016 12/31/2015  . Hyperlipidemia   . Chest pain with high risk of acute coronary syndrome 12/23/2015  . Precordial chest pain 12/03/2014  . TIA (transient ischemic attack)   . CAD- diffuse LAD disease at cath 12/30/15   . Thrombocytopenia ([place])   . Type 2 diabetes mellitus ([place])   . Anxiety   . PVC's (premature ventricular contractions) 01/06/2013  . Obstructive sleep apnea-declines C-pap 07/04/2012  . Mixed hyperlipidemia 07/30/2009  . Essential hypertension, benign 07/30/2009   Discharged Condition: good  History of Present Illness:  [name] is a 60 year-old male with known history of coronary artery disease, hypertension, hypercholesterolemia, type 2 diabetes mellitus, obstructive sleep apnea, GE reflux disease, asthma, and TIAs who was admitted to the hospital with symptoms consistent with unstable angina.   The patient's cardiac history started approximately 15 years ago when he presented with symptoms of angina, which was treated with PCI and subsequent stent placement.  In January of last year the patient again presented with unstable angina and underwent PCI with subsequent placement of drug eluting stents to his LAD and OM1.  Since then he has been routinely followed by Dr. Domenic Polite through [facility] in [place].  He remained stable until last week at which time he developed an episode of substernal chest pain pressure associated with dyspnea, diaphoresis, and nausea while at work.  He presented to the ED and was ruled out for MI with negative cardiac enzymes.  He was ultimately discharged home with instructions to follow up with his primary cardiologist.  However, his symptoms recurred 2 days later and the patient was readmitted to the hospital.  He was again ruled out for acute MI.  He was taken for cardiac catheterization by Dr. Gwenlyn Found which demonstrated severe multivessel CAD with preserved EF.  Surgical consultation was requested.  He was evaluated by Dr. Roxy Manns on 12/31/2015 at which time he felt surgical revascularization would be the patient's best treatment option.  The risks and benefits of the procedure were explained to the patient and he was agreeable to proceed.  Hospital Course:   [name] presented to [facility] hospital on 01/09/2016.  He underwent CABG x 3 utilizing LIMA to LAD, SVG to OM2, and SVG to PDA.  He also underwent open harvest of greater saphenous vein from right leg.  His left leg was explored but no adequate conduit was found.  He tolerated the procedure without difficulty and was taken to the SICU in stable condition.  The patient was extubated the evening of surgery.  During his stay in the SICU the patient's chest tube and arterial lines were removed without difficulty.  He was maintaining NSR and felt medically stable for transfer to the stepdown for further care.  He is  ambulating on room. He tolerating a diabetic diet. He finally had a bowel movement after laxatives today. Epicardial pacing wires were previously removed. Chest tube sutures will be removed today. A portion of his right thigh wound has dehisced. There is serous drainage. There is no erythema or sign of infection. Per Dr. Roxy Manns several Nylons sutures were placed to close the wound. He is maintaining sinus rhythm and is felt surgically stable for discharge today.  Significant Diagnostic Studies: angiography:    Ost RPDA lesion, 80% stenosed.  Lat 1st Mrg-1 lesion, 70% stenosed.  1st Mrg-1 lesion, 70% stenosed.  1st Mrg-2 lesion, 80% stenosed.  1st Diag lesion, 80% stenosed.  Mid LAD lesion, 75% stenosed.  Dist LAD-1 lesion, 75% stenosed.  Dist LAD-2 lesion, 90% stenosed.  The left ventricular systolic function is normal.  Treatments: surgery:    Coronary Artery Bypass Grafting x 3 Left Internal Mammary Artery to Distal Left Anterior Descending Coronary Artery Free Right Internal Mammary Artery to Second Obtuse Marginal Branch of Left Circumflex Coronary Artery Saphenous Vein Graft to Posterior Descending Coronary Artery Open Vein Harvest from Right Thigh, Exploration of Left Thigh without Harvest of Conduit  Disposition: 01-Home or Self Care   Discharge Medications:  The patient has been discharged on:   1.Beta Blocker:  Yes [x   ]                              No   [   ]                              If No, reason:  2.Ace Inhibitor/ARB: Yes [ x  ]                                     No  [    ]                                     If No, reason:  3.Statin:   Yes [   ]                  No  [x   ]                  If No, reason: Intolerance  4.Shela CommonsLauro Regulus   ]                  No   [   ]                  If No, reason:       Discharge Instructions    Amb Referral to Cardiac Rehabilitation    Complete by:  As  directed   Diagnosis:  CABG            Medication List  STOP taking these medications        nitroGLYCERIN 0.2 mg/hr patch  Commonly known as:  NITRODUR - Dosed in mg/24 hr     nitroGLYCERIN 0.4 MG SL tablet  Commonly known as:  NITROSTAT      TAKE these medications        acetaminophen 500 MG tablet  Commonly known as:  TYLENOL  Take 1,000 mg by mouth every 6 (six) hours as needed (pain).     albuterol 108 (90 Base) MCG/ACT inhaler  Commonly known as:  PROVENTIL HFA;VENTOLIN HFA  Inhale 2 puffs into the lungs every 6 (six) hours as needed for wheezing or shortness of breath.     albuterol (2.5 MG/3ML) 0.083% nebulizer solution  Commonly known as:  PROVENTIL  Take 2.5 mg by nebulization every 6 (six) hours as needed for wheezing or shortness of breath.     amLODipine 2.5 MG tablet  Commonly known as:  NORVASC  Take 1 tablet (2.5 mg total) by mouth 2 (two) times daily.     aspirin EC 81 MG tablet  Take 81 mg by mouth at bedtime.     citalopram 20 MG tablet  Commonly known as:  CELEXA  Take 20 mg by mouth at bedtime.     clopidogrel 75 MG tablet  Commonly known as:  PLAVIX  Take 1 tablet (75 mg total) by mouth daily.     fluticasone 50 MCG/ACT nasal spray  Commonly known as:  FLONASE  Place 1 spray into the nose 2 (two) times daily.     Fluticasone-Salmeterol 250-50 MCG/DOSE Aepb  Commonly known as:  ADVAIR  Inhale 1 puff into the lungs every 12 (twelve) hours.     furosemide 40 MG tablet  Commonly known as:  LASIX  Take 1 tablet (40 mg total) by mouth daily. For one week then stop.     gabapentin 400 MG capsule  Commonly known as:  NEURONTIN  Take 400 mg by mouth 3 (three) times daily.     LANTUS SOLOSTAR 100 UNIT/ML Solostar Pen  Generic drug:  Insulin Glargine  Inject 60 Units into the skin 2 (two) times daily.     Melatonin 5 MG Tabs  Take 10 mg by mouth at bedtime.     metFORMIN 1000 MG tablet  Commonly known as:  GLUCOPHAGE  Take 1 tablet  (1,000 mg total) by mouth 2 (two) times daily.     metoprolol tartrate 25 MG tablet  Commonly known as:  LOPRESSOR  Take 0.5 tablets (12.5 mg total) by mouth daily.     NOVOLOG FLEXPEN 100 UNIT/ML FlexPen  Generic drug:  insulin aspart  Inject 1-15 Units into the skin 3 (three) times daily as needed for high blood sugar (CBG >120). Per sliding scale written down at home     omeprazole 20 MG capsule  Commonly known as:  PRILOSEC  Take 20 mg by mouth 2 (two) times daily before a meal.     oxyCODONE 5 MG immediate release tablet  Commonly known as:  Oxy IR/ROXICODONE  Take 1 tablet (5 mg total) by mouth every 4 (four) hours as needed for severe pain.     Potassium Chloride ER 20 MEQ Tbcr  Take 20 mEq by mouth daily. For one week then stop.     ramipril 5 MG tablet  Commonly known as:  ALTACE  Take 5 mg by mouth at bedtime.     terbinafine 250 MG tablet  Commonly known as:  LAMISIL  Take 250 mg by mouth at bedtime. For toe fungus       Follow-up Information    Follow up with Rexene Alberts, MD On 02/10/2016.   Specialty:  Cardiothoracic Surgery   Why:  PA/LAT CXR to be taken (at [facility] which is in the same building as Dr. Guy Sandifer office) on 02/10/2016 at 12:45 pm;Appointment time is at 1:30 pm   Contact information:   [place] [address] [phone]       Follow up with Rozann Lesches, MD On 02/05/2016.   Specialty:  Cardiology   Why:  Appointment is at 10:20   Contact information:   [place] [address] [phone]       Follow up with TAPPER,DAVID B, MD.   Specialty:  Family Medicine   Why:  Call for a follow up appointment regarding further diabetes management and surveillance of HGA1C 7.9   Contact information:   [place] [address] [phone]       Follow up with Nurse On 01/27/2016.   Why:  Appointment is to have right thigh sutures removed. Appointment time is at 10:00 am    Contact information:   [address] [place] [address] [phone]      Signed: Nani Skillern PA-C 01/14/2016, 12:38 PM

## 2016-01-10 NOTE — Progress Notes (Signed)
Patient taken off of CPAP HS and placed on [facility] with no complications. Nurse aware, will continue to monitor pt.

## 2016-01-10 NOTE — Discharge Instructions (Signed)
Coronary Artery Bypass Grafting, Care After Refer to this sheet in the next few weeks. These instructions provide you with information on caring for yourself after your procedure. Your health care provider may also give you more specific instructions. Your treatment has been planned according to current medical practices, but problems sometimes occur. Call your health care provider if you have any problems or questions after your procedure. WHAT TO EXPECT AFTER THE PROCEDURE Recovery from surgery will be different for everyone. Some people feel well after 3 or 4 weeks, while for others it takes longer. After your procedure, it is typical to have the following:  Nausea and a lack of appetite.   Constipation.  Weakness and fatigue.   Depression or irritability.   Pain or discomfort at your incision site. HOME CARE INSTRUCTIONS  Take medicines only as directed by your health care provider. Do not stop taking medicines or start any new medicines without first checking with your health care provider.  Take your pulse as directed by your health care provider.  Perform deep breathing as directed by your health care provider. If you were given a device called an incentive spirometer, use it to practice deep breathing several times a day. Support your chest with a pillow or your arms when you take deep breaths or cough.  Keep incision areas clean, dry, and protected. Remove or change any bandages (dressings) only as directed by your health care provider. You may have skin adhesive strips over the incision areas. Do not take the strips off. They will fall off on their own.  Check incision areas daily for any swelling, redness, or drainage.  If incisions were made in your legs, do the following:  Avoid crossing your legs.   Avoid sitting for long periods of time. Change positions every 30 minutes.   Elevate your legs when you are sitting.  Wear compression stockings as directed by your  health care provider. These stockings help keep blood clots from forming in your legs.  Take showers once your health care provider approves. Until then, only take sponge baths. Pat incisions dry. Do not rub incisions with a washcloth or towel. Do not take baths, swim, or use a hot tub until your health care provider approves.  Eat foods that are high in fiber, such as raw fruits and vegetables, whole grains, beans, and nuts. Meats should be lean cut. Avoid canned, processed, and fried foods.  Drink enough fluid to keep your urine clear or pale yellow.  Weigh yourself every day. This helps identify if you are retaining fluid that may make your heart and lungs work harder.  Rest and limit activity as directed by your health care provider. You may be instructed to:  Stop any activity at once if you have chest pain, shortness of breath, irregular heartbeats, or dizziness. Get help right away if you have any of these symptoms.  Move around frequently for short periods or take short walks as directed by your health care provider. Increase your activities gradually. You may need physical therapy or cardiac rehabilitation to help strengthen your muscles and build your endurance.  Avoid lifting, pushing, or pulling anything heavier than 10 lb (4.5 kg) for at least 6 weeks after surgery.  Do not drive until your health care provider approves.  Ask your health care provider when you may return to work.  Ask your health care provider when you may resume sexual activity.  Keep all follow-up visits as directed by your health care  provider. This is important. SEEK MEDICAL CARE IF:  You have swelling, redness, increasing pain, or drainage at the site of an incision.  You have a fever.  You have swelling in your ankles or legs.  You have pain in your legs.   You gain 2 or more pounds (0.9 kg) a day.  You are nauseous or vomit.  You have diarrhea. SEEK IMMEDIATE MEDICAL CARE IF:  You have  chest pain that goes to your jaw or arms.  You have shortness of breath.   You have a fast or irregular heartbeat.   You notice a "clicking" in your breastbone (sternum) when you move.   You have numbness or weakness in your arms or legs.  You feel dizzy or light-headed.  MAKE SURE YOU:  Understand these instructions.  Will watch your condition.  Will get help right away if you are not doing well or get worse.   This information is not intended to replace advice given to you by your health care provider. Make sure you discuss any questions you have with your health care provider.   Document Released: 05/15/2005 Document Revised: 11/16/2014 Document Reviewed: 04/04/2013 Elsevier Interactive Patient Education 2016 [place].  Endoscopic Saphenous Vein Harvesting, Care After Refer to this sheet in the next few weeks. These instructions provide you with information on caring for yourself after your procedure. Your health care provider may also give you more specific instructions. Your treatment has been planned according to current medical practices, but problems sometimes occur. Call your health care provider if you have any problems or questions after your procedure. HOME CARE INSTRUCTIONS Medicine  Take whatever pain medicine your surgeon prescribes. Follow the directions carefully. Do not take over-the-counter pain medicine unless your surgeon says it is okay. Some pain medicine can cause bleeding problems for several weeks after surgery.  Follow your surgeon's instructions about driving. You will probably not be permitted to drive after heart surgery.  Take any medicines your surgeon prescribes. Any medicines you took before your heart surgery should be checked with your health care provider before you start taking them again. Wound care  If your surgeon has prescribed an elastic bandage or stocking, ask how long you should wear it.  Check the area around your surgical  cuts (incisions) whenever your bandages (dressings) are changed. Look for any redness or swelling.  You will need to return to have the stitches (sutures) or staples taken out. Ask your surgeon when to do that.  Ask your surgeon when you can shower or bathe. Activity  Try to keep your legs raised when you are sitting.  Do any exercises your health care providers have given you. These may include deep breathing exercises, coughing, walking, or other exercises. SEEK MEDICAL CARE IF:  You have any questions about your medicines.  You have more leg pain, especially if your pain medicine stops working.  New or growing bruises develop on your leg.  Your leg swells, feels tight, or becomes red.  You have numbness in your leg. SEEK IMMEDIATE MEDICAL CARE IF:  Your pain gets much worse.  Blood or fluid leaks from any of the incisions.  Your incisions become warm, swollen, or red.  You have chest pain.  You have trouble breathing.  You have a fever.  You have more pain near your leg incision. MAKE SURE YOU:  Understand these instructions.  Will watch your condition.  Will get help right away if you are not doing well or  get worse.   This information is not intended to replace advice given to you by your health care provider. Make sure you discuss any questions you have with your health care provider.   Document Released: 07/08/2011 Document Revised: 11/16/2014 Document Reviewed: 07/08/2011 Elsevier Interactive Patient Education [employer].

## 2016-01-10 NOTE — Procedures (Signed)
Extubation Procedure Note  Patient Details:   Name: [name] [name] DOB: 12-19-1955 MRN: [id number]   Airway Documentation:   Pre-extubation: Rapid Wean completed. ABG normal. NIF-36, VC 950. Cuff leak present. Follows all commands.  Post-extubation: 3L [name], No Stridor, Name/location spoken. Clear BBS.     Evaluation  O2 sats: stable throughout Complications: No apparent complications Patient did tolerate procedure well. Bilateral Breath Sounds: Clear, Diminished Suctioning: Oral, Airway Yes  Sharen Hint 01/10/2016, 12:39 AM

## 2016-01-11 ENCOUNTER — Inpatient Hospital Stay (HOSPITAL_COMMUNITY): Payer: Medicare Other

## 2016-01-11 LAB — CBC
HCT: 33.7 % — ABNORMAL LOW (ref 39.0–52.0)
Hemoglobin: 11.4 g/dL — ABNORMAL LOW (ref 13.0–17.0)
MCH: 31.1 pg (ref 26.0–34.0)
MCHC: 33.8 g/dL (ref 30.0–36.0)
MCV: 92.1 fL (ref 78.0–100.0)
PLATELETS: 82 10*3/uL — AB (ref 150–400)
RBC: 3.66 MIL/uL — AB (ref 4.22–5.81)
RDW: 13.3 % (ref 11.5–15.5)
WBC: 9.7 10*3/uL (ref 4.0–10.5)

## 2016-01-11 LAB — BASIC METABOLIC PANEL
Anion gap: 8 (ref 5–15)
BUN: 24 mg/dL — ABNORMAL HIGH (ref 6–20)
CALCIUM: 8.3 mg/dL — AB (ref 8.9–10.3)
CO2: 26 mmol/L (ref 22–32)
CREATININE: 1.29 mg/dL — AB (ref 0.61–1.24)
Chloride: 104 mmol/L (ref 101–111)
GFR calc non Af Amer: 60 mL/min — ABNORMAL LOW (ref >60)
Glucose, Bld: 153 mg/dL — ABNORMAL HIGH (ref 65–99)
Potassium: 4.6 mmol/L (ref 3.5–5.1)
SODIUM: 138 mmol/L (ref 135–145)

## 2016-01-11 LAB — GLUCOSE, CAPILLARY
GLUCOSE-CAPILLARY: 182 mg/dL — AB (ref 65–99)
GLUCOSE-CAPILLARY: 135 mg/dL — AB (ref 65–99)
GLUCOSE-CAPILLARY: 112 mg/dL — AB (ref 65–99)
GLUCOSE-CAPILLARY: 262 mg/dL — AB (ref 65–99)
GLUCOSE-CAPILLARY: 226 mg/dL — AB (ref 65–99)
Glucose-Capillary: 113 mg/dL — ABNORMAL HIGH (ref 65–99)
Glucose-Capillary: 169 mg/dL — ABNORMAL HIGH (ref 65–99)

## 2016-01-11 MED ORDER — SODIUM CHLORIDE 0.9 % IV SOLN: 250.0000 mL | INTRAVENOUS | Status: DC | PRN

## 2016-01-11 MED ORDER — DOCUSATE SODIUM 100 MG PO CAPS
200.0000 mg | ORAL_CAPSULE | Freq: Every day | ORAL | Status: DC
  Administered 2016-01-12 – 2016-01-14 (×2): 200 mg via ORAL
  Filled 2016-01-11 (×2): qty 2

## 2016-01-11 MED ORDER — BISACODYL 5 MG PO TBEC: 10.0000 mg | DELAYED_RELEASE_TABLET | Freq: Every day | ORAL | Status: DC | PRN

## 2016-01-11 MED ORDER — ACETAMINOPHEN 325 MG PO TABS
650.0000 mg | ORAL_TABLET | Freq: Four times a day (QID) | ORAL | Status: DC | PRN
  Administered 2016-01-13 – 2016-01-14 (×2): 650 mg via ORAL
  Filled 2016-01-11 (×2): qty 2

## 2016-01-11 MED ORDER — SODIUM CHLORIDE 0.9% FLUSH
3.0000 mL | Freq: Two times a day (BID) | INTRAVENOUS | Status: DC
Start: 2016-01-11 — End: 2016-01-14
  Administered 2016-01-11 – 2016-01-14 (×5): 3 mL via INTRAVENOUS

## 2016-01-11 MED ORDER — INSULIN DETEMIR 100 UNIT/ML ~~LOC~~ SOLN
25.0000 [IU] | Freq: Two times a day (BID) | SUBCUTANEOUS | Status: DC
  Administered 2016-01-11 – 2016-01-14 (×6): 25 [IU] via SUBCUTANEOUS
  Filled 2016-01-11 (×8): qty 0.25

## 2016-01-11 MED ORDER — FUROSEMIDE 40 MG PO TABS
40.0000 mg | ORAL_TABLET | Freq: Every day | ORAL | Status: AC
  Administered 2016-01-11 – 2016-01-14 (×4): 40 mg via ORAL
  Filled 2016-01-11 (×4): qty 1

## 2016-01-11 MED ORDER — MOVING RIGHT ALONG BOOK
Freq: Once | Status: AC
  Administered 2016-01-11: 16:00:00
  Filled 2016-01-11: qty 1

## 2016-01-11 MED ORDER — SODIUM CHLORIDE 0.9% FLUSH: 3.0000 mL | INTRAVENOUS | Status: DC | PRN

## 2016-01-11 MED ORDER — FUROSEMIDE 10 MG/ML IJ SOLN
INTRAMUSCULAR | Status: AC
  Filled 2016-01-11: qty 4

## 2016-01-11 MED ORDER — FAMOTIDINE 20 MG PO TABS
20.0000 mg | ORAL_TABLET | Freq: Two times a day (BID) | ORAL | Status: DC
  Administered 2016-01-11 – 2016-01-14 (×6): 20 mg via ORAL
  Filled 2016-01-11 (×6): qty 1

## 2016-01-11 MED ORDER — ASPIRIN EC 325 MG PO TBEC: 325.0000 mg | DELAYED_RELEASE_TABLET | Freq: Every day | ORAL | Status: DC

## 2016-01-11 MED ORDER — INSULIN ASPART 100 UNIT/ML ~~LOC~~ SOLN
0.0000 [IU] | Freq: Three times a day (TID) | SUBCUTANEOUS | Status: DC
  Administered 2016-01-11: 12 [IU] via SUBCUTANEOUS
  Administered 2016-01-11: 8 [IU] via SUBCUTANEOUS
  Administered 2016-01-12: 2 [IU] via SUBCUTANEOUS
  Administered 2016-01-12: 4 [IU] via SUBCUTANEOUS
  Administered 2016-01-12 (×2): 8 [IU] via SUBCUTANEOUS
  Administered 2016-01-13: 4 [IU] via SUBCUTANEOUS
  Administered 2016-01-13: 8 [IU] via SUBCUTANEOUS
  Administered 2016-01-13 – 2016-01-14 (×3): 2 [IU] via SUBCUTANEOUS
  Administered 2016-01-14: 8 [IU] via SUBCUTANEOUS

## 2016-01-11 MED ORDER — OXYCODONE HCL 5 MG PO TABS
5.0000 mg | ORAL_TABLET | ORAL | Status: DC | PRN
  Administered 2016-01-11 (×2): 10 mg via ORAL
  Administered 2016-01-12: 5 mg via ORAL
  Administered 2016-01-13: 10 mg via ORAL
  Filled 2016-01-11: qty 1
  Filled 2016-01-11 (×3): qty 2

## 2016-01-11 MED ORDER — ONDANSETRON HCL 4 MG/2ML IJ SOLN
4.0000 mg | Freq: Four times a day (QID) | INTRAMUSCULAR | Status: DC | PRN
  Administered 2016-01-12 – 2016-01-13 (×2): 4 mg via INTRAVENOUS
  Filled 2016-01-11 (×2): qty 2

## 2016-01-11 MED ORDER — BISACODYL 10 MG RE SUPP: 10.0000 mg | Freq: Every day | RECTAL | Status: DC | PRN

## 2016-01-11 MED ORDER — TRAMADOL HCL 50 MG PO TABS
50.0000 mg | ORAL_TABLET | ORAL | Status: DC | PRN
  Administered 2016-01-11: 50 mg via ORAL
  Administered 2016-01-12 (×3): 100 mg via ORAL
  Administered 2016-01-13 (×2): 50 mg via ORAL
  Filled 2016-01-11 (×3): qty 2
  Filled 2016-01-11 (×3): qty 1

## 2016-01-11 MED ORDER — ONDANSETRON HCL 4 MG PO TABS
4.0000 mg | ORAL_TABLET | Freq: Four times a day (QID) | ORAL | Status: DC | PRN
  Administered 2016-01-14: 4 mg via ORAL
  Filled 2016-01-11: qty 1

## 2016-01-11 NOTE — Progress Notes (Signed)
2 Days Post-Op Procedure(s) (LRB): CORONARY ARTERY BYPASS GRAFTING (CABG) X 3 UTILIZING RIGHT AND LEFT INTERNAL MAMMARY ARTERY AND ENDOSCOPICALLY HARVESTED SAPHENEOUS VEIN. (N/A) TRANSESOPHAGEAL ECHOCARDIOGRAM (TEE) (N/A) Subjective:  Reports some numbness in the ulnar part of right hand and left foot plantar surface.  Objective: Vital signs in last 24 hours: Temp:  [97.8 F (36.6 C)-99.2 F (37.3 C)] 99.1 F (37.3 C) (03/04 1126) Pulse Rate:  [78-89] 81 (03/04 1000) Cardiac Rhythm:  [-] Normal sinus rhythm (03/04 1000) Resp:  [12-20] 13 (03/04 1000) BP: (94-134)/(59-76) 130/76 mmHg (03/04 1000) SpO2:  [90 %-99 %] 91 % (03/04 1000) Arterial Line BP: (125-132)/(60-65) 125/61 mmHg (03/03 1400) Weight:  [109.4 kg (241 lb 2.9 oz)] 109.4 kg (241 lb 2.9 oz) (03/04 0829)  Hemodynamic parameters for last 24 hours:    Intake/Output from previous day: 03/03 0701 - 03/04 0700 In: 1269.7 [P.O.:780; I.V.:339.7; IV Piggyback:150] Out: 755 [Urine:685; Chest Tube:70] Intake/Output this shift: Total I/O In: -  Out: 250 [Urine:250]  General appearance: alert and cooperative Neurologic: intact Heart: regular rate and rhythm, S1, S2 normal, no murmur, click, rub or gallop Lungs: clear to auscultation bilaterally Extremities: extremities normal, atraumatic, no cyanosis or edema Wound: dressing dry  Lab Results:  Recent Labs  01/10/16 1700 01/10/16 1711 01/11/16 0333  WBC 11.5*  --  9.7  HGB 12.4* 12.6* 11.4*  HCT 36.7* 37.0* 33.7*  PLT 104*  --  82*   BMET:  Recent Labs  01/10/16 0435  01/10/16 1711 01/11/16 0333  NA 140  --  140 138  K 4.7  --  4.8 4.6  CL 108  --  99* 104  CO2 22  --   --  26  GLUCOSE 137*  --  185* 153*  BUN 16  --  24* 24*  CREATININE 1.20  < > 1.30* 1.29*  CALCIUM 8.0*  --   --  8.3*  < > = values in this interval not displayed.  PT/INR:  Recent Labs  01/09/16 1440  LABPROT 17.1*  INR 1.38   ABG    Component Value Date/Time   PHART 7.345*  01/10/2016 0219   HCO3 24.1* 01/10/2016 0219   TCO2 28 01/10/2016 1711   ACIDBASEDEF 2.0 01/10/2016 0219   O2SAT 91.0 01/10/2016 0219   CBG (last 3)   Recent Labs  01/11/16 0722 01/11/16 0745 01/11/16 1123  GLUCAP 112* 113* 169*   CXR: ok  Assessment/Plan: S/P Procedure(s) (LRB): CORONARY ARTERY BYPASS GRAFTING (CABG) X 3 UTILIZING RIGHT AND LEFT INTERNAL MAMMARY ARTERY AND ENDOSCOPICALLY HARVESTED SAPHENEOUS VEIN. (N/A) TRANSESOPHAGEAL ECHOCARDIOGRAM (TEE) (N/A)  He is hemodynamically stable in sinus rhythm Ambulating well Diuresis Resume Plavix for prior stents Plan for transfer to step-down: see transfer orders   LOS: 2 days    [name] [name] 01/11/2016

## 2016-01-11 NOTE — Progress Notes (Signed)
Patient transferred to 2W04, patient sitting in chair, receiving RN in room, belongings at bedside including SCDs, patient placed on tele, call bell within reach.  Rowe Pavy, RN

## 2016-01-11 NOTE — Progress Notes (Signed)
Pt. placed on CPAP for h/s with 2 lpm Oxygen blended into circuit, pts. own nasal mask, humidifier filled is tolerating well, Wife remaining @ bedside, RT to monitor.

## 2016-01-12 LAB — BASIC METABOLIC PANEL
ANION GAP: 10 (ref 5–15)
BUN: 28 mg/dL — ABNORMAL HIGH (ref 6–20)
CALCIUM: 8.7 mg/dL — AB (ref 8.9–10.3)
CO2: 23 mmol/L (ref 22–32)
Chloride: 107 mmol/L (ref 101–111)
Creatinine, Ser: 1.16 mg/dL (ref 0.61–1.24)
Glucose, Bld: 159 mg/dL — ABNORMAL HIGH (ref 65–99)
POTASSIUM: 5.9 mmol/L — AB (ref 3.5–5.1)
SODIUM: 140 mmol/L (ref 135–145)

## 2016-01-12 LAB — GLUCOSE, CAPILLARY
GLUCOSE-CAPILLARY: 159 mg/dL — AB (ref 65–99)
GLUCOSE-CAPILLARY: 203 mg/dL — AB (ref 65–99)
GLUCOSE-CAPILLARY: 208 mg/dL — AB (ref 65–99)
Glucose-Capillary: 226 mg/dL — ABNORMAL HIGH (ref 65–99)

## 2016-01-12 MED ORDER — MOMETASONE FURO-FORMOTEROL FUM 100-5 MCG/ACT IN AERO
2.0000 | INHALATION_SPRAY | Freq: Two times a day (BID) | RESPIRATORY_TRACT | Status: DC
  Administered 2016-01-12 – 2016-01-14 (×4): 2 via RESPIRATORY_TRACT
  Filled 2016-01-12: qty 8.8

## 2016-01-12 MED ORDER — METFORMIN HCL 500 MG PO TABS
1000.0000 mg | ORAL_TABLET | Freq: Two times a day (BID) | ORAL | Status: DC
  Administered 2016-01-12 (×2): 1000 mg via ORAL
  Filled 2016-01-12 (×2): qty 2

## 2016-01-12 MED ORDER — ASPIRIN EC 81 MG PO TBEC
81.0000 mg | DELAYED_RELEASE_TABLET | Freq: Every day | ORAL | Status: DC
  Administered 2016-01-12: 81 mg via ORAL
  Filled 2016-01-12: qty 1

## 2016-01-12 NOTE — Progress Notes (Signed)
Pt ambulated 450 ft using RW.  Gait steady, no complaints, no tele alarms.  To recliner after walk with wife and son present.  Will con't plan of care.

## 2016-01-12 NOTE — Progress Notes (Signed)
Pt ambulated 450 ft using RW. Verbal cues for sternal precautions but otherwise moving well.  Complaints of soreness.  To recliner after walk with call bell in reach.  Will con't plan of care.

## 2016-01-12 NOTE — Progress Notes (Signed)
Pt. placed on nasal CPAP at 11 cmh20 with 2 lpm oxygen blended in, tolerating well, wife remaining at bedside, aware to notify if needed, Rt to monitor.

## 2016-01-12 NOTE — Progress Notes (Addendum)
      [address]       [place] [address]             [phone]        3 Days Post-Op Procedure(s) (LRB): CORONARY ARTERY BYPASS GRAFTING (CABG) X 3 UTILIZING RIGHT AND LEFT INTERNAL MAMMARY ARTERY AND ENDOSCOPICALLY HARVESTED SAPHENEOUS VEIN. (N/A) TRANSESOPHAGEAL ECHOCARDIOGRAM (TEE) (N/A)  Subjective: Patient with incisional pain. He also has complaints of numbness in right hand (but is improving) and numbness left foot since surgery. He states heat pack is helping  Objective: Vital signs in last 24 hours: Temp:  [98.6 F (37 C)-99.5 F (37.5 C)] 98.6 F (37 C) (03/05 0442) Pulse Rate:  [78-95] 83 (03/05 0442) Cardiac Rhythm:  [-] Normal sinus rhythm (03/04 1900) Resp:  [12-20] 18 (03/05 0442) BP: (112-159)/(59-76) 131/74 mmHg (03/05 0442) SpO2:  [90 %-97 %] 96 % (03/05 0442) Weight:  [239 lb 6.4 oz (108.591 kg)-241 lb 2.9 oz (109.4 kg)] 239 lb 6.4 oz (108.591 kg) (03/05 0442)  Pre op weight 104 kg Current Weight  01/12/16 239 lb 6.4 oz (108.591 kg)      Intake/Output from previous day: 03/04 0701 - 03/05 0700 In: 410 [P.O.:360; IV Piggyback:50] Out: 2050 [Urine:2050]   Physical Exam:  Cardiovascular: RRR Pulmonary: Diminished at bases Abdomen: Soft, non tender, bowel sounds present. Extremities: Mild bilateral lower extremity edema. Wounds: Aquacel removed and sternal wound is clean and dry.  No erythema or signs of infection. Bilateral lower extremity wounds are clean and dry.  Lab Results: CBC: Recent Labs  01/10/16 1700 01/10/16 1711 01/11/16 0333  WBC 11.5*  --  9.7  HGB 12.4* 12.6* 11.4*  HCT 36.7* 37.0* 33.7*  PLT 104*  --  82*   BMET:  Recent Labs  01/11/16 0333 01/12/16 0700  NA 138 140  K 4.6 5.9*  CL 104 107  CO2 26 23  GLUCOSE 153* 159*  BUN 24* 28*  CREATININE 1.29* 1.16  CALCIUM 8.3* 8.7*    PT/INR:  Lab Results  Component Value Date   INR 1.38 01/09/2016   INR 1.16 01/07/2016   INR 1.20 12/30/2015     ABG:  INR: Will add last result for INR, ABG once components are confirmed Will add last 4 CBG results once components are confirmed  Assessment/Plan:  1. CV- SR in the 80's. On Lopressor 12.5 mg bid and Plavix. Will decrease ecasa to 81 mg as is on Plavix. 2.  Pulmonary - Encourage incentive spirometer 3. Volume Overload - On Lasix 40 mg daily 4.  Acute blood loss anemia - H and H yesterday 11.4 and 33.7 5. Hyperkalemia-K 5.9. Not on supplementation. Re check in am 6. Thrombocytopenia-platelets yesterday 82,000 7. DM-CBGs 262/226/159. On Insulin. Was on Metformin pre op. Will restart now that creatinine decreased for better glucose control. Pre op HGA1C 8.3. He will need close medical follow up after discharge 8. Remove EPW 9. Home in 1-2 days  ZIMMERMAN,DONIELLE MPA-C 01/12/2016,8:05 AM   Chart reviewed, patient examined, agree with above. He is making good progress.

## 2016-01-13 LAB — GLUCOSE, CAPILLARY
GLUCOSE-CAPILLARY: 145 mg/dL — AB (ref 65–99)
GLUCOSE-CAPILLARY: 150 mg/dL — AB (ref 65–99)
Glucose-Capillary: 215 mg/dL — ABNORMAL HIGH (ref 65–99)
Glucose-Capillary: 195 mg/dL — ABNORMAL HIGH (ref 65–99)
Glucose-Capillary: 153 mg/dL — ABNORMAL HIGH (ref 65–99)

## 2016-01-13 LAB — BASIC METABOLIC PANEL
Anion gap: 11 (ref 5–15)
BUN: 18 mg/dL (ref 6–20)
CALCIUM: 8.7 mg/dL — AB (ref 8.9–10.3)
CO2: 30 mmol/L (ref 22–32)
CREATININE: 1.19 mg/dL (ref 0.61–1.24)
Chloride: 100 mmol/L — ABNORMAL LOW (ref 101–111)
Glucose, Bld: 189 mg/dL — ABNORMAL HIGH (ref 65–99)
Potassium: 3.6 mmol/L (ref 3.5–5.1)
SODIUM: 141 mmol/L (ref 135–145)

## 2016-01-13 MED ORDER — METOPROLOL TARTRATE 25 MG PO TABS: 12.5000 mg | ORAL_TABLET | Freq: Two times a day (BID) | ORAL | Status: DC

## 2016-01-13 MED ORDER — AMLODIPINE BESYLATE 5 MG PO TABS
2.5000 mg | ORAL_TABLET | Freq: Two times a day (BID) | ORAL | Status: DC
  Administered 2016-01-13 – 2016-01-14 (×3): 2.5 mg via ORAL
  Filled 2016-01-13 (×3): qty 1

## 2016-01-13 MED ORDER — OXYCODONE HCL 5 MG PO TABS: 5.0000 mg | ORAL_TABLET | ORAL | Status: DC | PRN

## 2016-01-13 MED ORDER — FUROSEMIDE 40 MG PO TABS: 40.0000 mg | ORAL_TABLET | Freq: Every day | ORAL | Status: DC

## 2016-01-13 MED ORDER — RAMIPRIL 5 MG PO CAPS
5.0000 mg | ORAL_CAPSULE | Freq: Every day | ORAL | Status: DC
Start: 2016-01-13 — End: 2016-01-14
  Administered 2016-01-13: 5 mg via ORAL
  Filled 2016-01-13: qty 1

## 2016-01-13 MED ORDER — LACTULOSE 10 GM/15ML PO SOLN
20.0000 g | Freq: Once | ORAL | Status: AC
  Administered 2016-01-13: 20 g via ORAL
  Filled 2016-01-13: qty 30

## 2016-01-13 MED ORDER — ASPIRIN EC 81 MG PO TBEC: 81.0000 mg | DELAYED_RELEASE_TABLET | Freq: Every day | ORAL | Status: DC

## 2016-01-13 MED ORDER — METFORMIN HCL 500 MG PO TABS
1000.0000 mg | ORAL_TABLET | Freq: Two times a day (BID) | ORAL | Status: DC
  Administered 2016-01-13 – 2016-01-14 (×3): 1000 mg via ORAL
  Filled 2016-01-13 (×3): qty 2

## 2016-01-13 MED ORDER — ASPIRIN EC 81 MG PO TBEC
81.0000 mg | DELAYED_RELEASE_TABLET | Freq: Every day | ORAL | Status: DC
  Administered 2016-01-13 – 2016-01-14 (×2): 81 mg via ORAL
  Filled 2016-01-13 (×2): qty 1

## 2016-01-13 NOTE — Progress Notes (Signed)
CARDIAC REHAB PHASE I   PRE:  Rate/Rhythm: 82 SR  BP:  Sitting: 147/68       SaO2: 92 RA  MODE:  Ambulation: 330 ft   POST:  Rate/Rhythm: 90 SR  BP:  Sitting: 145/71         SaO2: 91 RA  Pt in bed, very drowsy, states he walked early this morning, agreeable to walk again. Pt stood independently, good use of sternal precautions. Pt ambulated 330 ft on RA, rolling walker, assist x1, slow, steady gait, tolerated well. Pt c/o dizziness, fatigue, standing rest x2. Pt floor wet upon returning to room, pt requested to sit, once floor was dry, pt able to ambulate to recliner.VSS. Encouraged additional ambulation x1 today, IS. Pt would benefit from rolling walker for home use. Pt wife also expressed concern that the toilets in their apartment are very low, pt wife requesting 3 in 1 for home use, will discuss with case manager.  Pt to recliner after walk, nurse and wife at bedside. Will follow.   [id number] Lenna Sciara, RN, BSN 01/13/2016 10:17 AM

## 2016-01-13 NOTE — Care Management Important Message (Signed)
Important Message  Patient Details  Name: [name] [name] MRN: [id number] Date of Birth: Sep 18, 1956   Medicare Important Message Given:  Yes    Nathen May 01/13/2016, 2:47 PM
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
## 2016-01-13 NOTE — Progress Notes (Signed)
EPW removed this morning, patient tolerated well, all ends were attached. RN educated patient on bed rest for an hour, pt verbalized understanding. Continue to monitor.   Candise Che, RN BSN

## 2016-01-14 LAB — GLUCOSE, CAPILLARY
GLUCOSE-CAPILLARY: 228 mg/dL — AB (ref 65–99)
Glucose-Capillary: 147 mg/dL — ABNORMAL HIGH (ref 65–99)

## 2016-01-14 MED ORDER — OXYCODONE HCL 5 MG PO TABS: 5.0000 mg | ORAL_TABLET | ORAL | Status: DC | PRN

## 2016-01-14 MED ORDER — POTASSIUM CHLORIDE ER 20 MEQ PO TBCR: 20.0000 meq | EXTENDED_RELEASE_TABLET | Freq: Every day | ORAL | Status: DC

## 2016-01-14 MED ORDER — METOPROLOL TARTRATE 25 MG PO TABS: 12.5000 mg | ORAL_TABLET | Freq: Every day | ORAL | Status: DC

## 2016-01-14 MED ORDER — FUROSEMIDE 40 MG PO TABS: 40.0000 mg | ORAL_TABLET | Freq: Every day | ORAL | Status: DC

## 2016-01-14 MED ORDER — FLEET ENEMA 7-19 GM/118ML RE ENEM: 1.0000 | ENEMA | Freq: Once | RECTAL | Status: DC

## 2016-01-14 MED ORDER — POTASSIUM CHLORIDE ER 20 MEQ PO TBCR
10.0000 meq | EXTENDED_RELEASE_TABLET | Freq: Every day | ORAL | Status: DC
Start: 2016-01-14 — End: 2016-01-14

## 2016-01-14 MED ORDER — LIDOCAINE HCL (PF) 1 % IJ SOLN
INTRAMUSCULAR | Status: AC
  Filled 2016-01-14: qty 5

## 2016-01-14 MED ORDER — CLOPIDOGREL BISULFATE 75 MG PO TABS: 75.0000 mg | ORAL_TABLET | Freq: Every day | ORAL | Status: DC

## 2016-01-14 NOTE — Progress Notes (Signed)
Betadine was used to clean upper thigh. 1% xylociane was used to achieve local anesthetic. 3-0 Nylon sutures were placed in dehisced areas of right thigh wound as well as in middle of lower right wound. There were no complicaitons and the atient tolerated the procedure well. Per discussion with Dr. Roxy Manns, patient has moved his bowels and will be discharged home today.

## 2016-01-14 NOTE — Progress Notes (Addendum)
      [address]       [place] [address]             [phone]        5 Days Post-Op Procedure(s) (LRB): CORONARY ARTERY BYPASS GRAFTING (CABG) X 3 UTILIZING RIGHT AND LEFT INTERNAL MAMMARY ARTERY AND ENDOSCOPICALLY HARVESTED SAPHENEOUS VEIN. (N/A) TRANSESOPHAGEAL ECHOCARDIOGRAM (TEE) (N/A)  Subjective: Patient with constipation and draining from right thigh wound.  Objective: Vital signs in last 24 hours: Temp:  [98.5 F (36.9 C)-99.3 F (37.4 C)] 99.1 F (37.3 C) (03/07 0455) Pulse Rate:  [82-91] 82 (03/07 0455) Cardiac Rhythm:  [-] Normal sinus rhythm (03/06 1900) Resp:  [16-20] 16 (03/07 0455) BP: (128-163)/(67-78) 128/71 mmHg (03/07 0455) SpO2:  [93 %-99 %] 95 % (03/07 0455) Weight:  [236 lb 3.2 oz (107.14 kg)] 236 lb 3.2 oz (107.14 kg) (03/07 0455)  Pre op weight 104 kg Current Weight  01/14/16 236 lb 3.2 oz (107.14 kg)      Intake/Output from previous day: 03/06 0701 - 03/07 0700 In: 52 [P.O.:610] Out: 800 [Urine:800]   Physical Exam:  Cardiovascular: RRR Pulmonary: Slightly diminished at bases Abdomen: Soft, non tender, bowel sounds present. Extremities: Mild bilateral lower extremity edema. Wounds: Sternal wound is clean and dry. No erythema or signs of infection. Bilateral lower extremity wounds are clean and dry. The second right incision in the thigh has partially dehisced. There is serous drainage.   Lab Results: CBC:No results for input(s): WBC, HGB, HCT, PLT in the last 72 hours. BMET:   Recent Labs  01/12/16 0700 01/13/16 0221  NA 140 141  K 5.9* 3.6  CL 107 100*  CO2 23 30  GLUCOSE 159* 189*  BUN 28* 18  CREATININE 1.16 1.19  CALCIUM 8.7* 8.7*    PT/INR:  Lab Results  Component Value Date   INR 1.38 01/09/2016   INR 1.16 01/07/2016   INR 1.20 12/30/2015   ABG:  INR: Will add last result for INR, ABG once components are confirmed Will add last 4 CBG results once components are  confirmed  Assessment/Plan:  1. CV- SR in the 80's. On Lopressor 12.5 mg bid, Norvasc 2.5 mg daily, Altace 5 mg daily, and Plavix.  2.  Pulmonary - On room air. Encourage incentive spirometer 3. Volume Overload - On Lasix 40 mg daily 4.  Acute blood loss anemia - H and H yesterday 11.4 and 33.7 5. Thrombocytopenia-last platelets 82,000 6. DM-CBGs 150/153/147. On Insulin and Metformin. HGA1C 8.3. He will need close medical follow up after discharge 7. LOC constipation 8. Steri strips applied to right thigh wound  ZIMMERMAN,DONIELLE MPA-C 01/14/2016,7:32 AM   I have seen and examined the patient and agree with the assessment and plan as outlined.  Rexene Alberts, MD 01/14/2016 10:03 AM

## 2016-01-14 NOTE — Progress Notes (Signed)
Pt walking independently with family and RW. No c/o, VSS. He will need RW for home. Ed completed with pt and wife and son. Voiced understanding. Will send CRPII referral to [facility]. Nina, ACSM 12:09 PM 01/14/2016

## 2016-01-14 NOTE — Care Management Note (Addendum)
Case Management Note  Patient Details  Name: [name] [name] MRN: [id number] Date of Birth: 1956-09-14  Subjective/Objective:               CABG x3     Action/Plan: NCM spoke to pt and wife, Olean Ree at bedside. Requesting RW and 3n1 for home. Contacted [facility] DME rep for equipment for home. Pt states he can afford his medications at home.  PCP- Dr Matthias Hughs  Expected Discharge Date:  01/14/2016              Expected Discharge Plan:  Home/Self Care  In-House Referral:  NA  Discharge planning Services  CM Consult  Post Acute Care Choice:  NA Choice offered to:  NA  DME Arranged:  3-N-1, Walker rolling DME Agency:  [place]:  NA [place] Agency:  NA  Status of Service:  Completed, signed off  Medicare Important Message Given:  Yes Date Medicare IM Given:    Medicare IM give by:    Date Additional Medicare IM Given:    Additional Medicare Important Message give by:     If discussed at [place] of Stay Meetings, dates discussed:    Additional Comments:  Erenest Rasher, RN 01/14/2016, 3:24 PM

## 2016-01-14 NOTE — Progress Notes (Addendum)
Patient discharged home with family. IV was dc'd and was intact. Patient went home with a rolling walker and bed side commode. Patient stated he understood the discharge instructions and medications.  Patient left jacket. Wife was called and told jacket is labeled in a bag at the secretary desk.

## 2016-01-16 ENCOUNTER — Other Ambulatory Visit: Payer: Self-pay | Admitting: *Deleted

## 2016-01-16 DIAGNOSIS — G8918 Other acute postprocedural pain: Secondary | ICD-10-CM

## 2016-01-16 MED ORDER — TRAMADOL HCL 50 MG PO TABS: 50.0000 mg | ORAL_TABLET | Freq: Four times a day (QID) | ORAL | Status: DC | PRN

## 2016-01-16 NOTE — Progress Notes (Signed)
[name] [name] was discharged on Oxycodone for pain after surgery. He had a problem with it in the hospital, but was prescribed the same when discharged.  It causes hallucinations. Tramadol has been prescribed and his wife is aware. A script was faxed to their pharmacy.

## 2016-01-17 ENCOUNTER — Encounter: Payer: Medicare Other | Admitting: Cardiology

## 2016-01-17 ENCOUNTER — Telehealth: Payer: Self-pay | Admitting: Cardiology

## 2016-01-17 NOTE — Telephone Encounter (Signed)
Called and informed wife that paperwork would need to be seen by our office to determine if we can make corrections or fill it out. Wife verbalized understanding and will drop paperwork off by the office today.

## 2016-01-17 NOTE — Telephone Encounter (Signed)
Dr Ron Parker filled leave papers out for patient while in the hosp.  He left  A date out and they would like to know if Dr Domenic Polite could fill in date

## 2016-01-27 ENCOUNTER — Ambulatory Visit (INDEPENDENT_AMBULATORY_CARE_PROVIDER_SITE_OTHER): Payer: Self-pay

## 2016-01-27 DIAGNOSIS — G8918 Other acute postprocedural pain: Secondary | ICD-10-CM

## 2016-01-27 DIAGNOSIS — Z951 Presence of aortocoronary bypass graft: Secondary | ICD-10-CM

## 2016-01-27 DIAGNOSIS — Z4802 Encounter for removal of sutures: Secondary | ICD-10-CM

## 2016-01-27 MED ORDER — TRAMADOL HCL 50 MG PO TABS: 50.0000 mg | ORAL_TABLET | Freq: Four times a day (QID) | ORAL | Status: DC | PRN

## 2016-01-27 NOTE — Progress Notes (Signed)
Removed 9 sutures from right inner thigh ([place] incision site) No signs of infection and patient tolerated well. Tramadol RX was refilled.

## 2016-02-05 ENCOUNTER — Encounter: Payer: Self-pay | Admitting: Cardiology

## 2016-02-05 ENCOUNTER — Ambulatory Visit (INDEPENDENT_AMBULATORY_CARE_PROVIDER_SITE_OTHER): Payer: Medicare Other | Admitting: Cardiology

## 2016-02-05 VITALS — BP 110/56 | HR 83 | Ht 69.0 in | Wt 225.0 lb

## 2016-02-05 DIAGNOSIS — Z889 Allergy status to unspecified drugs, medicaments and biological substances status: Secondary | ICD-10-CM | POA: Diagnosis not present

## 2016-02-05 DIAGNOSIS — I2510 Atherosclerotic heart disease of native coronary artery without angina pectoris: Secondary | ICD-10-CM | POA: Diagnosis not present

## 2016-02-05 DIAGNOSIS — E782 Mixed hyperlipidemia: Secondary | ICD-10-CM | POA: Diagnosis not present

## 2016-02-05 DIAGNOSIS — I10 Essential (primary) hypertension: Secondary | ICD-10-CM

## 2016-02-05 DIAGNOSIS — E1159 Type 2 diabetes mellitus with other circulatory complications: Secondary | ICD-10-CM

## 2016-02-05 DIAGNOSIS — Z789 Other specified health status: Secondary | ICD-10-CM

## 2016-02-05 MED ORDER — EZETIMIBE 10 MG PO TABS: 10.0000 mg | ORAL_TABLET | Freq: Every day | ORAL | Status: DC

## 2016-02-05 NOTE — Patient Instructions (Signed)
Medication Instructions:  START ZETIA 10 MG DAILY   Labwork: NONE  Testing/Procedures: NONE  Follow-Up: Your physician recommends that you schedule a follow-up appointment in: 2 MONTHS    Any Other Special Instructions Will Be Listed Below (If Applicable).  I SENT IN A REFERRAL FOR CARDIAC REHAB, SOMEONE WILL BE CONTACTING YOU  FROM THEIR OFFICE SOON.   If you need a refill on your cardiac medications before your next appointment, please call your pharmacy.

## 2016-02-05 NOTE — Progress Notes (Signed)
Cardiology Office Note  Date: 02/05/2016   ID: Aviel, Davalos August 31, 1956, MRN [id number]  PCP: Deloria Lair, MD  Primary Cardiologist: Rozann Lesches, MD   Chief Complaint  Patient presents with  . Hospitalization Follow-up    History of Present Illness: [name] [name] is a 60 y.o. male that I last saw in May 2016. I reviewed his chart, he presented with unstable angina symptoms in March and underwent cardiac catheterization demonstrating multivessel CAD as shown below. He was seen by Dr. Roxy Manns with TCTS and underwent CABG including LIMA to the LAD, SVG to OM 2, and SVG to PDA. He comes in today for a follow-up visit.  He states that he has been walking and gradually increasing his activity, although still not lifting anything heavy. He will be seeing Dr. Roxy Manns next month. He does not report any angina symptoms, currently has NYHA class II dyspnea. I reviewed his medications which are outlined below. Cardiac regimen includes aspirin, Plavix, Lopressor, Altace, and Norvasc. He has known intolerance to statins and also TriCor. We discussed an attempt at using Zetia. His most recent LDL was 144.  We talked about cardiac rehabilitation today, referral has been made. He does indicate that he may be moving in the next 1-2 months to [place]. If he does he will be establishing with a new cardiology practice.  He does not report any fevers or chills, gradually improving chest wall soreness post surgery. Still has feeling of paresthesia in his right arm in ulnar distribution, also some left leg symptoms. He has had no increasing drainage or erythema at his vein harvest sites or chest wall incisions.  Past Medical History  Diagnosis Date  . Coronary atherosclerosis of native coronary artery     a. LAD & OM stenting;  b. 2007 Cath: nonobs dzs, patent stents;  c. 07/2012 neg MV, EF 61%.  d. 11/20/14: [place] s/p  PCI w/ DES to pLAD and DES to OM1   . Asthma   . Essential hypertension, benign     . TIA (transient ischemic attack) ~ 2010  . Anxiety   . Hypercholesteremia   . GERD (gastroesophageal reflux disease)   . OSA on CPAP   . Type 2 diabetes mellitus ([place])   . History of hiatal hernia   . Migraine   . Arthritis   . Lumbar herniated disc   . Chronic lower back pain   . History of gout   . Thrombocytopenia ([place])   . MI, old   . Hearing loss of left ear   . Cataract     right eye  . Coronary artery disease involving native coronary artery with unstable angina pectoris ([place])   . 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 Anterior cervical decomp/discectomy fusion  1998    "C3-4"  . Carpal tunnel release Bilateral 2005  . Shoulder arthroscopy Left ~ 2011  . Knee surgery Left 02/2012    "scraped; open"  . Neuroplasty / transposition ulnar nerve at elbow Right ~ 2012  . Left heart catheterization with coronary angiogram N/A 07/20/2012    Procedure: LEFT HEART CATHETERIZATION WITH CORONARY ANGIOGRAM;  Surgeon: [name], MD;  Location: [facility] CATH LAB;  Service: Cardiovascular;  Laterality: N/A;  . Coronary angioplasty with stent placement  2002; 2003; 11/20/2014    "  I have 4 stents after today" (11/20/2014)  . Cardiac catheterization  "several"  . Coronary angioplasty    . Left heart catheterization with coronary angiogram N/A 11/20/2014    Procedure: LEFT HEART CATHETERIZATION WITH CORONARY ANGIOGRAM;  Surgeon: [name], MD;  Location: [facility] CATH LAB;  Service: Cardiovascular;  Laterality: N/A;  . Percutaneous coronary rotoblator intervention (pci-r)  11/20/2014    Procedure: PERCUTANEOUS CORONARY ROTOBLATOR INTERVENTION (PCI-R);  Surgeon: [name], MD;  Location: [facility] CATH LAB;  Service: Cardiovascular;;  . Left heart catheterization with coronary angiogram N/A 11/26/2014    Procedure: LEFT HEART CATHETERIZATION WITH CORONARY ANGIOGRAM;  Surgeon:  [name], MD;  Location: [facility] CATH LAB;  Service: Cardiovascular;  Laterality: N/A;  . Cardiac catheterization N/A 12/30/2015    Procedure: Left Heart Cath and Coronary Angiography;  Surgeon: [name], MD;  Location: [facility] CV LAB;  Service: Cardiovascular;  Laterality: N/A;  . Colonoscopy    . Esophagogastroduodenoscopy    . Coronary artery bypass graft N/A 01/09/2016    Procedure: CORONARY ARTERY BYPASS GRAFTING (CABG) X 3 UTILIZING RIGHT AND LEFT INTERNAL MAMMARY ARTERY AND ENDOSCOPICALLY HARVESTED SAPHENEOUS VEIN.;  Surgeon: Rexene Alberts, MD;  Location: [facility];  Service: Open Heart Surgery;  Laterality: N/A;  . Tee without cardioversion N/A 01/09/2016    Procedure: TRANSESOPHAGEAL ECHOCARDIOGRAM (TEE);  Surgeon: Rexene Alberts, MD;  Location: [facility];  Service: Open Heart Surgery;  Laterality: N/A;    Current Outpatient Prescriptions  Medication Sig Dispense Refill  . acetaminophen (TYLENOL) 500 MG tablet Take 1,000 mg by mouth every 6 (six) hours as needed (pain).    [name] albuterol (PROVENTIL HFA;VENTOLIN HFA) 108 (90 BASE) MCG/ACT inhaler Inhale 2 puffs into the lungs every 6 (six) hours as needed for wheezing or shortness of breath.    [name] albuterol (PROVENTIL) (2.5 MG/3ML) 0.083% nebulizer solution Take 2.5 mg by nebulization every 6 (six) hours as needed for wheezing or shortness of breath.    [name] amLODipine (NORVASC) 2.5 MG tablet Take 1 tablet (2.5 mg total) by mouth 2 (two) times daily. 60 tablet 0  . aspirin EC 81 MG tablet Take 81 mg by mouth at bedtime.     . citalopram (CELEXA) 20 MG tablet Take 20 mg by mouth at bedtime.     . clopidogrel (PLAVIX) 75 MG tablet Take 1 tablet (75 mg total) by mouth daily. 30 tablet 1  . fluticasone (FLONASE) 50 MCG/ACT nasal spray Place 1 spray into the nose 2 (two) times daily.     . Fluticasone-Salmeterol (ADVAIR) 250-50 MCG/DOSE AEPB Inhale 1 puff into the lungs every 12 (twelve) hours.    . gabapentin (NEURONTIN) 400 MG capsule Take 400  mg by mouth 3 (three) times daily.    . insulin aspart (NOVOLOG FLEXPEN) 100 UNIT/ML FlexPen Inject 1-15 Units into the skin 3 (three) times daily as needed for high blood sugar (CBG >120). Per sliding scale written down at home    . Insulin Glargine (LANTUS SOLOSTAR) 100 UNIT/ML Solostar Pen Inject 60 Units into the skin 2 (two) times daily.    . Melatonin 5 MG TABS Take 10 mg by mouth at bedtime.     . metFORMIN (GLUCOPHAGE) 1000 MG tablet Take 1 tablet (1,000 mg total) by mouth 2 (two) times daily. 60 tablet 11  . metoprolol (LOPRESSOR) 25 MG tablet Take 0.5 tablets (12.5 mg total) by mouth daily. 30 tablet 1  . omeprazole (PRILOSEC) 20 MG capsule Take 20  mg by mouth 2 (two) times daily before a meal.    . ramipril (ALTACE) 5 MG tablet Take 5 mg by mouth at bedtime.     . traMADol (ULTRAM) 50 MG tablet Take 1 tablet (50 mg total) by mouth every 6 (six) hours as needed. 40 tablet 0  . ezetimibe (ZETIA) 10 MG tablet Take 1 tablet (10 mg total) by mouth daily. 90 tablet 3   No current facility-administered medications for this visit.   Allergies:  Imdur; Statins; Tricor; and Ranexa   Social History: The patient  reports that he has never smoked. He has never used smokeless tobacco. He reports that he does not drink alcohol or use illicit drugs.   ROS:  Please see the history of present illness. Otherwise, complete review of systems is positive for improving appetite.  All other systems are reviewed and negative.   Physical Exam: VS:  BP 110/56 mmHg  Pulse 83  Ht 5' 9"  (1.753 m)  Wt 225 lb (102.059 kg)  BMI 33.21 kg/m2  SpO2 93%, BMI Body mass index is 33.21 kg/(m^2).  Wt Readings from Last 3 Encounters:  02/05/16 225 lb (102.059 kg)  01/14/16 236 lb 3.2 oz (107.14 kg)  01/07/16 229 lb 3.2 oz (103.964 kg)    Obese male, no acute distress. HEENT: Conjunctiva and lids normal, oropharynx clear. Neck: Supple, no elevated JVP or carotid bruits, no thyromegaly. Lungs: Clear to  auscultation, nonlabored breathing at rest. No wheezing. Thorax: Well-healing sternal incision. Cardiac: Regular rate and rhythm, no S3 or significant systolic murmur, no pericardial rub. Abdomen: Soft, nontender, healing keyhole incisions, bowel sounds present. Extremities: Vein harvest sites with healing and scabbing, no unusual drainage, distal pulses 2+. Skin: Warm and dry. Muscular skeletal: No kyphosis. Neuropsychiatric: Alert and oriented 3.  ECG: I personally reviewed the prior tracing from 01/10/2016 which showed sinus rhythm with possible old inferior infarct pattern and nonspecific ST changes.  Recent Labwork: 01/07/2016: ALT 63; AST 39 01/10/2016: Magnesium 2.5* 01/11/2016: Hemoglobin 11.4*; Platelets 82* 01/13/2016: BUN 18; Creatinine, Ser 1.19; Potassium 3.6; Sodium 141     Component Value Date/Time   CHOL 230* 01/02/2016 0407   TRIG 279* 01/02/2016 0407   HDL 30* 01/02/2016 0407   CHOLHDL 7.7 01/02/2016 0407   VLDL 56* 01/02/2016 0407   LDLCALC 144* 01/02/2016 0407    Other Studies Reviewed Today:  Cardiac catheterization 12/30/2015: Dominance: Right   Left Anterior Descending   . Prox LAD to Mid LAD lesion, 0% stenosed. Previously placed Prox LAD to Mid LAD stent (unknown type) is patent.   . Mid LAD lesion, 75% stenosed.   . Dist LAD-1 lesion, 75% stenosed.   . Dist LAD-2 lesion, 90% stenosed.   . First Diagonal Branch   . 1st Diag lesion, 80% stenosed.     Left Circumflex   . First Obtuse Marginal Branch   . 1st Mrg-1 lesion, 70% stenosed.   . 1st Mrg-2 lesion, 80% stenosed.   . Lateral First Obtuse Marginal Branch   . Lat 1st Mrg-1 lesion, 70% stenosed.   . Lat 1st Mrg-2 lesion, 0% stenosed. Previously placed Lat 1st Mrg-2 stent (unknown type) is patent.     Right Coronary Artery   . Right Posterior Descending Artery   . Ost RPDA lesion, 80% stenosed.     Echocardiogram 12/29/2015: Study Conclusions  - Left ventricle: The cavity size was normal. Wall  thickness was  increased in a pattern of mild LVH. Systolic function was normal.  The estimated ejection fraction was in the range of 60% to 65%.  Wall motion was normal; there were no regional wall motion  abnormalities. Doppler parameters are consistent with abnormal  left ventricular relaxation (grade 1 diastolic dysfunction). - Aortic valve: There was mild to moderate regurgitation. Valve  area (VTI): 2.47 cm^2. Valve area (Vmax): 2.43 cm^2. - Left atrium: The atrium was moderately dilated. - Technically difficult study. Echocontrast was used to enhance  visualization.  Assessment and Plan:  1. Multivessel CAD status post presentation with unstable angina earlier in March, now status post CABG as outlined above by Dr. Roxy Manns. He is recuperating gradually at this time. We did make a referral to cardiac rehabilitation, although he may be moving to [place] and establishing with a new practice in that area. We will continue with current medical therapy. He sees Dr. Roxy Manns next month. I will make a follow-up visit for him as well unless he moves in the interim.  2. Essential hypertension, blood pressure is well controlled today.  3. Hyperlipidemia with known statin intolerance. We will try Zetia 10 mg daily. Recent LDL was 144.  4. Type 2 diabetes mellitus, followed by Dr. Scotty Court.  Current medicines were reviewed with the patient today.   Orders Placed This Encounter  Procedures  . AMB referral to cardiac rehabilitation    Disposition: FU with me in 2 months.   Signed, [name], MD, [facility] 02/05/2016 10:47 AM    [facility]. [address] [place], [place] [address] Phone: [phone]; Fax: [phone]

## 2016-02-07 ENCOUNTER — Other Ambulatory Visit: Payer: Self-pay | Admitting: Thoracic Surgery (Cardiothoracic Vascular Surgery)

## 2016-02-07 DIAGNOSIS — Z951 Presence of aortocoronary bypass graft: Secondary | ICD-10-CM

## 2016-02-10 ENCOUNTER — Ambulatory Visit: Payer: Self-pay

## 2016-02-10 ENCOUNTER — Ambulatory Visit: Payer: Medicare Other | Admitting: Thoracic Surgery (Cardiothoracic Vascular Surgery)

## 2016-02-10 ENCOUNTER — Encounter: Payer: Self-pay | Admitting: Thoracic Surgery (Cardiothoracic Vascular Surgery)

## 2016-02-10 ENCOUNTER — Ambulatory Visit (INDEPENDENT_AMBULATORY_CARE_PROVIDER_SITE_OTHER): Payer: Self-pay | Admitting: Thoracic Surgery (Cardiothoracic Vascular Surgery)

## 2016-02-10 ENCOUNTER — Ambulatory Visit
Admission: RE | Admit: 2016-02-10 | Discharge: 2016-02-10 | Disposition: A | Discharge status: Home / Self Care | Payer: Medicare Other | Source: Ambulatory Visit | Attending: Thoracic Surgery (Cardiothoracic Vascular Surgery) | Admitting: Thoracic Surgery (Cardiothoracic Vascular Surgery)

## 2016-02-10 VITALS — BP 112/70 | HR 88 | Resp 16 | Ht 69.0 in | Wt 225.0 lb

## 2016-02-10 DIAGNOSIS — Z951 Presence of aortocoronary bypass graft: Secondary | ICD-10-CM

## 2016-02-10 DIAGNOSIS — I2510 Atherosclerotic heart disease of native coronary artery without angina pectoris: Secondary | ICD-10-CM

## 2016-02-10 NOTE — Progress Notes (Signed)
[address]       [place] [address]             [phone]     CARDIOTHORACIC SURGERY OFFICE NOTE  Referring Provider is Carlena Bjornstad, MD  Primary Cardiologist is [name], MD PCP is Deloria Lair, MD   HPI:  Patient returns to the office today for routine follow-up status post coronary artery bypass grafting 3 using bilateral internal mammary artery grafts and a single saphenous vein graft on 01/09/2016 for severe three-vessel coronary artery disease and unstable angina.  The patient's early postoperative recovery was notable for pain and numbness in the ulnar nerve distribution of the right forearm and hand and in the distal left lower leg.  He otherwise recovered uneventfully and was discharged from the hospital on the fifth postoperative day. Since hospital discharge she has continued to cover uneventfully.  He was seen in the office last week by Dr. Domenic Polite and has been referred for outpatient cardiac rehabilitation. He returns to our office for routine follow-up today. He states that he is doing well. The pain in the ulnar nerve distribution of the right hand has improved but persists. Similarly, the patient still has some pain in his left lower leg. He has mild residual soreness in his chest. He is breathing comfortably and reports that he has been walking a fair amount without significant limitation.  Appetite is good. Blood sugars have been up and down. Overall he feels quite well and he is pleased with his progress. He and his wife are planning to move to [facility] this week.  Current Outpatient Prescriptions  Medication Sig Dispense Refill  . acetaminophen (TYLENOL) 500 MG tablet Take 1,000 mg by mouth every 6 (six) hours as needed (pain).    [name] albuterol (PROVENTIL HFA;VENTOLIN HFA) 108 (90 BASE) MCG/ACT inhaler Inhale 2 puffs into the lungs every 6 (six) hours as needed for wheezing or shortness of breath.    [name] albuterol  (PROVENTIL) (2.5 MG/3ML) 0.083% nebulizer solution Take 2.5 mg by nebulization every 6 (six) hours as needed for wheezing or shortness of breath.    [name] amLODipine (NORVASC) 2.5 MG tablet Take 1 tablet (2.5 mg total) by mouth 2 (two) times daily. 60 tablet 0  . aspirin EC 81 MG tablet Take 81 mg by mouth at bedtime.     . citalopram (CELEXA) 20 MG tablet Take 20 mg by mouth at bedtime.     . clopidogrel (PLAVIX) 75 MG tablet Take 1 tablet (75 mg total) by mouth daily. 30 tablet 1  . ezetimibe (ZETIA) 10 MG tablet Take 1 tablet (10 mg total) by mouth daily. 90 tablet 3  . fluticasone (FLONASE) 50 MCG/ACT nasal spray Place 1 spray into the nose 2 (two) times daily.     . Fluticasone-Salmeterol (ADVAIR) 250-50 MCG/DOSE AEPB Inhale 1 puff into the lungs every 12 (twelve) hours.    . gabapentin (NEURONTIN) 400 MG capsule Take 400 mg by mouth 3 (three) times daily.    . insulin aspart (NOVOLOG FLEXPEN) 100 UNIT/ML FlexPen Inject 1-15 Units into the skin 3 (three) times daily as needed for high blood sugar (CBG >120). Per sliding scale written down at home    . Insulin Glargine (LANTUS SOLOSTAR) 100 UNIT/ML Solostar Pen Inject 60 Units into the skin 2 (two) times daily.    . Melatonin 5 MG TABS Take 10 mg by mouth at bedtime.     [name]  metFORMIN (GLUCOPHAGE) 1000 MG tablet Take 1 tablet (1,000 mg total) by mouth 2 (two) times daily. 60 tablet 11  . metoprolol (LOPRESSOR) 25 MG tablet Take 0.5 tablets (12.5 mg total) by mouth daily. 30 tablet 1  . omeprazole (PRILOSEC) 20 MG capsule Take 20 mg by mouth 2 (two) times daily before a meal.    . ramipril (ALTACE) 5 MG tablet Take 5 mg by mouth at bedtime.     . traMADol (ULTRAM) 50 MG tablet Take 1 tablet (50 mg total) by mouth every 6 (six) hours as needed. 40 tablet 0   No current facility-administered medications for this visit.      Physical Exam:   BP 112/70 mmHg  Pulse 88  Resp 16  Ht 5' 9"  (1.753 m)  Wt 225 lb (102.059 kg)  BMI 33.21 kg/m2  SpO2  100%  General:  Well-appearing  Chest:   Clear to auscultation  CV:   Regular rate and rhythm without murmur  Incisions:  Clean and dry healing nicely  Abdomen:  Soft nontender  Extremities:  Warm and well-perfused  Diagnostic Tests:  CHEST 2 VIEW  COMPARISON: 01/11/2016.  FINDINGS: Mediastinum and hilar structures normal.Prior CABG. Heart size normal. Pulmonary vascularity normal. Mild right base subsegmental atelectasis. No pleural effusion or pneumothorax .  IMPRESSION: 1. Mild right base subsegmental atelectasis.  2. Prior CABG. Heart size normal .   Electronically Signed  By: Marcello Moores Register  On: 02/10/2016 16:28    Impression:  Patient is doing well approximately one month status post coronary artery bypass grafting 3.    Plan:  I have encouraged the patient to continue to gradually increase his physical activity as tolerated. I have reminded him that he should refrain from any sort of heavy lifting or strenuous use of his arms or shoulders for at least another 2 months. I have encouraged him to enroll in participate in outpatient cardiac rehabilitation program. We have not recommended any changes to his current medications at this time. We have discussed how important will be for him in the long run to keep his diabetes under extremely tight control. The long-term benefits of regular exercise and diet have been discussed. All of his questions have been addressed. The patient plans to find a cardiologist as soon as possible in the [place] area for long-term follow-up. He will plan to return to our office in approximately one year just to make sure that he continues to do well. He will call and return sooner should specific problems or difficulties arise.   Valentina Gu. Roxy Manns, MD 02/10/2016 5:04 PM

## 2016-02-10 NOTE — Patient Instructions (Addendum)
Continue to avoid any heavy lifting or strenuous use of your arms or shoulders for at least a total of three months from the time of surgery.  After three months you may gradually increase how much you lift or otherwise use your arms or chest as tolerated, with limits based upon whether or not activities lead to the return of significant discomfort.  Continue all previous medications without any changes at this time  You may return to driving an automobile as long as you are no longer requiring oral narcotic pain relievers during the daytime.  It would be wise to start driving only short distances during the daylight and gradually increase from there as you feel comfortable.  You are encouraged to enroll and participate in the outpatient cardiac rehab program beginning as soon as practical.  Make every effort to keep your diabetes under very tight control.  Follow up closely with your primary care physician or endocrinologist and strive to keep their hemoglobin A1c levels as low as possible, preferably near or below 6.0.  The long term benefits of strict control of diabetes are far reaching and critically important for your overall health and survival.

## 2016-02-11 ENCOUNTER — Encounter: Payer: Medicare Other | Admitting: Cardiology

## 2016-04-10 ENCOUNTER — Ambulatory Visit: Payer: Medicare Other | Admitting: Cardiology

## 2017-01-11 ENCOUNTER — Encounter: Payer: Medicare Other | Admitting: Thoracic Surgery (Cardiothoracic Vascular Surgery)

## 2017-01-18 ENCOUNTER — Encounter: Payer: Medicare Other | Admitting: Thoracic Surgery (Cardiothoracic Vascular Surgery)

## 2017-03-26 NOTE — Progress Notes (Signed)
Cardiology Office Note  Date: 03/29/2017   ID: Verland, Sprinkle 1956/03/21, MRN [id number]  PCP: Deloria Lair., MD  Primary Cardiologist: Rozann Lesches, MD   Chief Complaint  Patient presents with  . Coronary Artery Disease    History of Present Illness: [name] [name] is a 61 y.o. male that I last saw and March 2017 following CABG by Dr. Roxy Manns in the setting of progressive multivessel CAD. He was to follow-up in 2 months. He moved in the interim to [place], although now is back in [place]. He states that he had follow-up with the cardiologist over the last year. He is now reestablishing with our practice.  He underwent cardiac catheterization at [facility] in [place] back in January of this year with results outlined below in the setting of progressive angina symptoms. He had patent bypass grafts but underwent intervention with stent to the first obtuse marginal which was not a bypassed vessel.  He does not report any active angina symptoms at this time. He states that he has been compliant with his medications. Recently saw Dr. Scotty Court for lab work as outlined below, we are requesting lipid studies as well. He tells me he was recently admitted to [facility] with TIA symptoms. He had carotid Dopplers and echocardiogram which we are requesting.  He has a history of statin intolerance, specifically recalls Lipitor, Zocor, Crestor, and even TriCor. He is not aware of ever having tried Pravachol.  Current cardiac medications include aspirin, Plavix, Lopressor, and Altace.  Past Medical History:  Diagnosis Date  . Anxiety   . Arthritis   . Asthma   . Cataract    right eye  . Chronic lower back pain   . Coronary artery disease involving native coronary artery with unstable angina pectoris ([place])   . Coronary atherosclerosis of native coronary artery    a. LAD & OM stenting;  b. 2007 Cath: nonobs dzs, patent stents;  c. 07/2012 neg MV, EF 61%.   d. 11/20/14: [place] s/p  PCI w/ DES to pLAD and DES to OM1   . Essential hypertension, benign   . GERD (gastroesophageal reflux disease)   . Hearing loss of left ear   . History of gout   . History of hiatal hernia   . Hypercholesteremia   . Lumbar herniated disc   . MI, old   . Migraine   . OSA on CPAP   . 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 Thrombocytopenia ([place])   . TIA (transient ischemic attack) ~ 2010  . Type 2 diabetes mellitus ([place])     Past Surgical History:  Procedure Laterality Date  . ANTERIOR CERVICAL DECOMP/DISCECTOMY FUSION  1998   "C3-4"  . CARDIAC CATHETERIZATION  "several"  . CARDIAC CATHETERIZATION N/A 12/30/2015   Procedure: Left Heart Cath and Coronary Angiography;  Surgeon: [name], MD;  Location: [facility] CV LAB;  Service: Cardiovascular;  Laterality: N/A;  . CARPAL TUNNEL RELEASE Bilateral 2005  . COLONOSCOPY    . CORONARY ANGIOPLASTY    . CORONARY ANGIOPLASTY WITH STENT PLACEMENT  2002; 2003; 11/20/2014   "I have 4 stents after today" (11/20/2014)  . CORONARY ARTERY BYPASS GRAFT N/A 01/09/2016   Procedure: CORONARY ARTERY BYPASS GRAFTING (CABG) X 3 UTILIZING RIGHT AND LEFT INTERNAL MAMMARY ARTERY AND ENDOSCOPICALLY HARVESTED SAPHENEOUS VEIN.;  Surgeon: Valentina Gu  Roxy Manns, MD;  Location: [facility];  Service: Open Heart Surgery;  Laterality: N/A;  . ESOPHAGOGASTRODUODENOSCOPY    . KNEE SURGERY Left 02/2012   "scraped; open"  . LEFT HEART CATHETERIZATION WITH CORONARY ANGIOGRAM N/A 07/20/2012   Procedure: LEFT HEART CATHETERIZATION WITH CORONARY ANGIOGRAM;  Surgeon: [name], MD;  Location: [facility] CATH LAB;  Service: Cardiovascular;  Laterality: N/A;  . LEFT HEART CATHETERIZATION WITH CORONARY ANGIOGRAM N/A 11/20/2014   Procedure: LEFT HEART CATHETERIZATION WITH CORONARY ANGIOGRAM;  Surgeon: [name], MD;  Location: [facility] CATH LAB;  Service: Cardiovascular;  Laterality: N/A;  . LEFT HEART  CATHETERIZATION WITH CORONARY ANGIOGRAM N/A 11/26/2014   Procedure: LEFT HEART CATHETERIZATION WITH CORONARY ANGIOGRAM;  Surgeon: [name], MD;  Location: [facility] CATH LAB;  Service: Cardiovascular;  Laterality: N/A;  . NEUROPLASTY / TRANSPOSITION ULNAR NERVE AT ELBOW Right ~ 2012  . PERCUTANEOUS CORONARY ROTOBLATOR INTERVENTION (PCI-R)  11/20/2014   Procedure: PERCUTANEOUS CORONARY ROTOBLATOR INTERVENTION (PCI-R);  Surgeon: [name], MD;  Location: [facility] CATH LAB;  Service: Cardiovascular;;  . SHOULDER ARTHROSCOPY Left ~ 2011  . TEE WITHOUT CARDIOVERSION N/A 01/09/2016   Procedure: TRANSESOPHAGEAL ECHOCARDIOGRAM (TEE);  Surgeon: Rexene Alberts, MD;  Location: [facility];  Service: Open Heart Surgery;  Laterality: N/A;    Current Outpatient Prescriptions  Medication Sig Dispense Refill  . acetaminophen (TYLENOL) 500 MG tablet Take 1,000 mg by mouth every 6 (six) hours as needed (pain).    [name] albuterol (PROVENTIL HFA;VENTOLIN HFA) 108 (90 BASE) MCG/ACT inhaler Inhale 2 puffs into the lungs every 6 (six) hours as needed for wheezing or shortness of breath.    [name] albuterol (PROVENTIL) (2.5 MG/3ML) 0.083% nebulizer solution Take 2.5 mg by nebulization every 6 (six) hours as needed for wheezing or shortness of breath.    [name] aspirin EC 81 MG tablet Take 81 mg by mouth at bedtime.     . citalopram (CELEXA) 20 MG tablet Take 20 mg by mouth at bedtime.     . clopidogrel (PLAVIX) 75 MG tablet Take 1 tablet (75 mg total) by mouth daily. 30 tablet 1  . fluticasone (FLONASE) 50 MCG/ACT nasal spray Place 1 spray into the nose 2 (two) times daily.     . Fluticasone-Salmeterol (ADVAIR) 250-50 MCG/DOSE AEPB Inhale 1 puff into the lungs every 12 (twelve) hours.    . gabapentin (NEURONTIN) 400 MG capsule Take 400 mg by mouth 3 (three) times daily.    . insulin aspart (NOVOLOG FLEXPEN) 100 UNIT/ML FlexPen Inject 1-15 Units into the skin 3 (three) times daily as needed for high blood sugar (CBG >120). Per sliding scale  written down at home    . Melatonin 5 MG TABS Take 10 mg by mouth at bedtime.     . metFORMIN (GLUCOPHAGE) 1000 MG tablet Take 1 tablet (1,000 mg total) by mouth 2 (two) times daily. 60 tablet 11  . metoprolol (LOPRESSOR) 25 MG tablet Take 0.5 tablets (12.5 mg total) by mouth daily. 30 tablet 1  . omeprazole (PRILOSEC) 20 MG capsule Take 20 mg by mouth 2 (two) times daily before a meal.    . ramipril (ALTACE) 10 MG capsule Take 10 mg by mouth daily.    [name] topiramate (TOPAMAX) 25 MG capsule Take 25 mg by mouth 2 (two) times daily.    . pravastatin (PRAVACHOL) 20 MG tablet Take 1 tablet (20 mg total) by mouth every other day. 90 tablet 3   No current facility-administered medications for this visit.  Allergies:  Imdur [isosorbide dinitrate]; Statins; Tramadol; Tricor [fenofibrate]; and Ranexa [ranolazine]   Social History: The patient  reports that he has never smoked. He has never used smokeless tobacco. He reports that he does not drink alcohol or use drugs.   Family History: The patient's family history includes Coronary artery disease (age of onset: 55) in his father.   ROS:  Please see the history of present illness. Otherwise, complete review of systems is positive for symptoms with recent apparent TIA were right-sided numbness.  All other systems are reviewed and negative.   Physical Exam: VS:  BP 116/74   Pulse 67   Ht 5' 9"  (1.753 m)   Wt 231 lb 3.2 oz (104.9 kg)   SpO2 99%   BMI 34.14 kg/m , BMI Body mass index is 34.14 kg/m.  Wt Readings from Last 3 Encounters:  03/29/17 231 lb 3.2 oz (104.9 kg)  02/10/16 225 lb (102.1 kg)  02/05/16 225 lb (102.1 kg)    Obese male, no acute distress. HEENT: Conjunctiva and lids normal, oropharynx clear. Neck: Supple, no elevated JVP or carotid bruits, no thyromegaly. Lungs: Clear to auscultation, nonlabored breathing at rest. No wheezing. Cardiac: Regular rate and rhythm, no S3 or significant systolic murmur, no pericardial  rub. Abdomen: Soft, nontender, bowel sounds present. Extremities: Trace ankle edema, distal pulses 2+. Skin: Warm and dry. Muscular skeletal: No kyphosis. Neuropsychiatric: Alert and oriented 3.  ECG: I personally reviewed the tracing from 01/10/2016 which showed sinus rhythm with possible old inferior infarct pattern and nonspecific ST changes.  Recent Labwork:    Component Value Date/Time   CHOL 230 (H) 01/02/2016 0407   TRIG 279 (H) 01/02/2016 0407   HDL 30 (L) 01/02/2016 0407   CHOLHDL 7.7 01/02/2016 0407   VLDL 56 (H) 01/02/2016 0407   LDLCALC 144 (H) 01/02/2016 0407  January 2018: 1114.3, potassium 3.7, BUN 18, creatinine 1.0  Other Studies Reviewed Today:  Cardiac catheterization 12/30/2015:    Dominance: Right        Left Anterior Descending   . Prox LAD to Mid LAD lesion, 0% stenosed. Previously placed Prox LAD to Mid LAD stent (unknown type) is patent.   . Mid LAD lesion, 75% stenosed.   . Dist LAD-1 lesion, 75% stenosed.   . Dist LAD-2 lesion, 90% stenosed.   . First Diagonal Branch   . 1st Diag lesion, 80% stenosed.          Left Circumflex   . First Obtuse Marginal Branch   . 1st Mrg-1 lesion, 70% stenosed.   . 1st Mrg-2 lesion, 80% stenosed.   . Lateral First Obtuse Marginal Branch   . Lat 1st Mrg-1 lesion, 70% stenosed.   . Lat 1st Mrg-2 lesion, 0% stenosed. Previously placed Lat 1st Mrg-2 stent (unknown type) is patent.          Right Coronary Artery   . Right Posterior Descending Artery   . Ost RPDA lesion, 80% stenosed.     Echocardiogram 12/29/2015: Study Conclusions  - Left ventricle: The cavity size was normal. Wall thickness was  increased in a pattern of mild LVH. Systolic function was normal.  The estimated ejection fraction was in the range of 60% to 65%.  Wall motion was normal; there were no regional wall motion  abnormalities. Doppler parameters are consistent with abnormal  left ventricular relaxation (grade 1  diastolic dysfunction). - Aortic valve: There was mild to moderate regurgitation. Valve  area (VTI): 2.47 cm^2. Valve area (Vmax):  2.43 cm^2. - Left atrium: The atrium was moderately dilated. - Technically difficult study. Echocontrast was used to enhance  visualization.  Cardiac catheterization and PCI 11/18/2016 ([facility]):  Lat 1st Mrg-1 lesion 75% stenosed.   Lat 1st Mrg-2 lesion 20% stenosed.   Prox Cx to Mid Cx lesion 35% stenosed.   1st Diag lesion 50% stenosed.   Ost LAD to Mid LAD lesion 50% stenosed.   Dist LAD lesion 100% stenosed.   Mid RCA lesion 25% stenosed.   Ost RPDA to RPDA lesion 100% stenosed.   Origin lesion 25% stenosed.   1st Mrg lesion 75% stenosed.   Successful PCI and stenting to the higher branch of the 1st marginal      branch which was not vascularized.   Patent previous grafts with severe native coronary disease.    RECOMMENDATIONS:  1. Aspirin indefinitely    2. Plavix indefinitely.    3. Aggressive risk factor modification medical therapy.  Assessment and Plan:  1. Multivessel CAD status post recurrent PCI's and ultimately CABG in March 2017 as outlined above. In January of this year he underwent cardiac catheterization in the setting of progressive angina with stent intervention to the first obtuse marginal branch (not bypassed) while in [place]. He does not endorse progressive angina at this time and we will continue with medical therapy including long-term DAPT.  2. Mixed hyperlipidemia with statin intolerance. We discussed previous medications today and will try Pravachol 20 mg every other day to see if he tolerates, if so can reassess lipids in about 6-8 weeks. Also recommended omega-3 supplements given prior documented hypertriglyceridemia and uncontrolled diabetes mellitus. Requesting most recent lipid panel from Dr. Scotty Court.  3. Type 2 diabetes mellitus, recently reestablished with Dr.  Scotty Court. Hemoglobin A1c was 8.0. States that he had previously been 11.0. We discussed weight loss. He is on Glucophage.  4. Essential hypertension, blood pressure well controlled today.  5. Recent reported TIA symptoms evaluated at [facility]. Requesting recent carotid Dopplers and echocardiogram results. He is on dual antiplatelet therapy.  Current medicines were reviewed with the patient today.  Disposition: Follow-up in 6 months.  Signed, [name], MD, [facility] 03/29/2017 2:37 PM    [facility], [place], [place] [address] Phone: [phone]; Fax: [phone]

## 2017-03-29 ENCOUNTER — Ambulatory Visit (INDEPENDENT_AMBULATORY_CARE_PROVIDER_SITE_OTHER): Payer: Medicare Other | Admitting: Cardiology

## 2017-03-29 ENCOUNTER — Encounter: Payer: Self-pay | Admitting: Cardiology

## 2017-03-29 VITALS — BP 116/74 | HR 67 | Ht 69.0 in | Wt 231.2 lb

## 2017-03-29 DIAGNOSIS — I25119 Atherosclerotic heart disease of native coronary artery with unspecified angina pectoris: Secondary | ICD-10-CM

## 2017-03-29 DIAGNOSIS — I10 Essential (primary) hypertension: Secondary | ICD-10-CM | POA: Diagnosis not present

## 2017-03-29 DIAGNOSIS — E1159 Type 2 diabetes mellitus with other circulatory complications: Secondary | ICD-10-CM | POA: Diagnosis not present

## 2017-03-29 DIAGNOSIS — Z789 Other specified health status: Secondary | ICD-10-CM | POA: Diagnosis not present

## 2017-03-29 DIAGNOSIS — E782 Mixed hyperlipidemia: Secondary | ICD-10-CM

## 2017-03-29 DIAGNOSIS — Z8673 Personal history of transient ischemic attack (TIA), and cerebral infarction without residual deficits: Secondary | ICD-10-CM

## 2017-03-29 DIAGNOSIS — I209 Angina pectoris, unspecified: Secondary | ICD-10-CM

## 2017-03-29 MED ORDER — PRAVASTATIN SODIUM 20 MG PO TABS: 20.0000 mg | ORAL_TABLET | Freq: Every evening | ORAL | 3 refills | Status: DC

## 2017-03-29 MED ORDER — PRAVASTATIN SODIUM 20 MG PO TABS: 20.0000 mg | ORAL_TABLET | ORAL | 3 refills | Status: DC

## 2017-03-29 NOTE — Patient Instructions (Addendum)
Medication Instructions:  Your physician has recommended you make the following change in your medication: BEGIN TAKING PRAVACHOL 20 MG EVERY OTHER DAY  CONTINUE ALL OTHER MEDICATIONS AS PRESCRIBED  Labwork: Checking with Dr. Electa Sniff office to see if lipids were done  Testing/Procedures: NONE   Follow-Up: Your physician wants you to follow-up in: [place]. You will receive a reminder letter in the mail two months in advance. If you don't receive a letter, please call our office to schedule the follow-up appointment.  Any Other Special Instructions Will Be Listed Below (If Applicable).  If you need a refill on your cardiac medications before your next appointment, please call your pharmacy.

## 2017-03-29 NOTE — Progress Notes (Signed)
Received records from [facility]. Patient recently evaluated there for possible TIA symptoms. Records indicate that both head CT and MRI did not show acute events. Echocardiogram reported LVEF 55-60%, mild left atrial enlargement, mild to moderate aortic regurgitation, trace mitral regurgitation, mild tricuspid regurgitation, and no pericardial effusion. Carotid Dopplers reported less than 50% bilateral ICA stenoses. ECG from 03/25/2017 showed sinus rhythm. Lipid panel showed cholesterol 219, triglycerides 789, HDL 22, LDL not calculated.

## 2017-04-18 ENCOUNTER — Encounter (HOSPITAL_COMMUNITY): Payer: Self-pay | Admitting: *Deleted

## 2017-04-18 ENCOUNTER — Emergency Department (HOSPITAL_COMMUNITY)
Admission: EM | Admit: 2017-04-18 | Discharge: 2017-04-18 | Disposition: A | Discharge status: Home / Self Care | Payer: Medicare Other | Attending: Emergency Medicine | Admitting: Emergency Medicine

## 2017-04-18 ENCOUNTER — Emergency Department (HOSPITAL_COMMUNITY): Payer: Medicare Other

## 2017-04-18 DIAGNOSIS — I10 Essential (primary) hypertension: Secondary | ICD-10-CM | POA: Diagnosis not present

## 2017-04-18 DIAGNOSIS — R739 Hyperglycemia, unspecified: Secondary | ICD-10-CM

## 2017-04-18 DIAGNOSIS — Z7982 Long term (current) use of aspirin: Secondary | ICD-10-CM | POA: Diagnosis not present

## 2017-04-18 DIAGNOSIS — E1165 Type 2 diabetes mellitus with hyperglycemia: Secondary | ICD-10-CM | POA: Insufficient documentation

## 2017-04-18 DIAGNOSIS — Z79899 Other long term (current) drug therapy: Secondary | ICD-10-CM | POA: Diagnosis not present

## 2017-04-18 DIAGNOSIS — J45909 Unspecified asthma, uncomplicated: Secondary | ICD-10-CM | POA: Diagnosis not present

## 2017-04-18 DIAGNOSIS — R200 Anesthesia of skin: Secondary | ICD-10-CM

## 2017-04-18 DIAGNOSIS — I2510 Atherosclerotic heart disease of native coronary artery without angina pectoris: Secondary | ICD-10-CM | POA: Insufficient documentation

## 2017-04-18 DIAGNOSIS — Z794 Long term (current) use of insulin: Secondary | ICD-10-CM | POA: Diagnosis not present

## 2017-04-18 LAB — CBC WITH DIFFERENTIAL/PLATELET
BASOS PCT: 0 %
Basophils Absolute: 0 10*3/uL (ref 0.0–0.1)
EOS PCT: 2 %
Eosinophils Absolute: 0.1 10*3/uL (ref 0.0–0.7)
HEMATOCRIT: 43.1 % (ref 39.0–52.0)
Hemoglobin: 15.7 g/dL (ref 13.0–17.0)
LYMPHS ABS: 1.2 10*3/uL (ref 0.7–4.0)
Lymphocytes Relative: 35 %
MCH: 31.7 pg (ref 26.0–34.0)
MCHC: 36.4 g/dL — ABNORMAL HIGH (ref 30.0–36.0)
MCV: 86.9 fL (ref 78.0–100.0)
MONO ABS: 0.2 10*3/uL (ref 0.1–1.0)
MONOS PCT: 7 %
Neutro Abs: 1.9 10*3/uL (ref 1.7–7.7)
Neutrophils Relative %: 56 %
PLATELETS: 108 10*3/uL — AB (ref 150–400)
RBC: 4.96 MIL/uL (ref 4.22–5.81)
RDW: 12.8 % (ref 11.5–15.5)
WBC: 3.4 10*3/uL — ABNORMAL LOW (ref 4.0–10.5)

## 2017-04-18 LAB — BASIC METABOLIC PANEL
ANION GAP: 12 (ref 5–15)
BUN: 23 mg/dL — AB (ref 6–20)
CO2: 23 mmol/L (ref 22–32)
Calcium: 9.7 mg/dL (ref 8.9–10.3)
Chloride: 102 mmol/L (ref 101–111)
Creatinine, Ser: 1.32 mg/dL — ABNORMAL HIGH (ref 0.61–1.24)
GFR calc Af Amer: >60 mL/min (ref >60)
GFR, EST NON AFRICAN AMERICAN: 58 mL/min — AB (ref >60)
GLUCOSE: 490 mg/dL — AB (ref 65–99)
POTASSIUM: 4.7 mmol/L (ref 3.5–5.1)
Sodium: 137 mmol/L (ref 135–145)

## 2017-04-18 MED ORDER — SODIUM CHLORIDE 0.9 % IV BOLUS (SEPSIS)
1000.0000 mL | Freq: Once | INTRAVENOUS | Status: AC
  Administered 2017-04-18: 1000 mL via INTRAVENOUS

## 2017-04-18 NOTE — ED Notes (Signed)
ED Provider at bedside. 

## 2017-04-18 NOTE — Discharge Instructions (Signed)
I'm concerned about the possibility of a TIA. Your glucose was elevated today. Strongly recommend follow-up with a neurologist as you have scheduled. Refill for [place]

## 2017-04-18 NOTE — ED Provider Notes (Addendum)
[facility] DEPT Provider Note   CSN: [id number] Arrival date & time: 04/18/17  1436     History   Chief Complaint Chief Complaint  Patient presents with  . Numbness    HPI ICHOLAS [name] is a 61 y.o. male.  Patient presents with numbness of his left face, left arm, left leg since awakening at 9 AM this morning. These symptoms have happened before. They are intermittent and not associated with any activity. Symptoms last a variable length of time but usually disappear. He has been seen in the ED before and told he has had a TIA. Recent MRI 2 weeks ago was stated as negative. Patient blames his symptoms on his blood pressure medications which have been increased slightly.  He claims to be taking both Toprol and Lopressor. He has an appt with the neurologist on June 12 in [place], [place]. His wife reports intermittent trouble with word finding and forgetfulness.      Past Medical History:  Diagnosis Date  . Anxiety   . Arthritis   . Asthma   . Cataract    right eye  . Chronic lower back pain   . Coronary artery disease involving native coronary artery with unstable angina pectoris ([place])   . Coronary atherosclerosis of native coronary artery    a. LAD & OM stenting;  b. 2007 Cath: nonobs dzs, patent stents;  c. 07/2012 neg MV, EF 61%.  d. 11/20/14: [place] s/p  PCI w/ DES to pLAD and DES to OM1   . Essential hypertension, benign   . GERD (gastroesophageal reflux disease)   . Hearing loss of left ear   . History of gout   . History of hiatal hernia   . Hypercholesteremia   . Lumbar herniated disc   . MI, old   . Migraine   . OSA on CPAP   . 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 Thrombocytopenia ([place])   . TIA (transient ischemic attack) ~ 2010  . Type 2 diabetes mellitus [facility])     Patient Active Problem List   Diagnosis Date Noted  . 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 Coronary artery disease involving  native coronary artery with unstable angina pectoris ([place])   . CAD -S/P PCI LAD OM/LAD '13 and 2016 12/31/2015  . Hyperlipidemia   . Chest pain with high risk of acute coronary syndrome 12/23/2015  . Precordial chest pain 12/03/2014  . TIA (transient ischemic attack)   . CAD- diffuse LAD disease at cath 12/30/15   . Thrombocytopenia ([place])   . Type 2 diabetes mellitus ([place])   . Anxiety   . PVC's (premature ventricular contractions) 01/06/2013  . Obstructive sleep apnea-declines C-pap 07/04/2012  . Mixed hyperlipidemia 07/30/2009  . Essential hypertension, benign 07/30/2009    Past Surgical History:  Procedure Laterality Date  . ANTERIOR CERVICAL DECOMP/DISCECTOMY FUSION  1998   "C3-4"  . CARDIAC CATHETERIZATION  "several"  . CARDIAC CATHETERIZATION N/A 12/30/2015   Procedure: Left Heart Cath and Coronary Angiography;  Surgeon: [name], MD;  Location: [facility] CV LAB;  Service: Cardiovascular;  Laterality: N/A;  . CARPAL TUNNEL RELEASE Bilateral 2005  . COLONOSCOPY    . CORONARY ANGIOPLASTY    . CORONARY ANGIOPLASTY WITH STENT PLACEMENT  2002; 2003; 11/20/2014   "I have 4 stents after today" (11/20/2014)  . CORONARY ARTERY BYPASS GRAFT N/A 01/09/2016  Procedure: CORONARY ARTERY BYPASS GRAFTING (CABG) X 3 UTILIZING RIGHT AND LEFT INTERNAL MAMMARY ARTERY AND ENDOSCOPICALLY HARVESTED SAPHENEOUS VEIN.;  Surgeon: Rexene Alberts, MD;  Location: [facility];  Service: Open Heart Surgery;  Laterality: N/A;  . ESOPHAGOGASTRODUODENOSCOPY    . KNEE SURGERY Left 02/2012   "scraped; open"  . LEFT HEART CATHETERIZATION WITH CORONARY ANGIOGRAM N/A 07/20/2012   Procedure: LEFT HEART CATHETERIZATION WITH CORONARY ANGIOGRAM;  Surgeon: [name], MD;  Location: [facility] CATH LAB;  Service: Cardiovascular;  Laterality: N/A;  . LEFT HEART CATHETERIZATION WITH CORONARY ANGIOGRAM N/A 11/20/2014   Procedure: LEFT HEART CATHETERIZATION WITH CORONARY ANGIOGRAM;  Surgeon: [name], MD;  Location: [facility]  CATH LAB;  Service: Cardiovascular;  Laterality: N/A;  . LEFT HEART CATHETERIZATION WITH CORONARY ANGIOGRAM N/A 11/26/2014   Procedure: LEFT HEART CATHETERIZATION WITH CORONARY ANGIOGRAM;  Surgeon: [name], MD;  Location: [facility] CATH LAB;  Service: Cardiovascular;  Laterality: N/A;  . NEUROPLASTY / TRANSPOSITION ULNAR NERVE AT ELBOW Right ~ 2012  . PERCUTANEOUS CORONARY ROTOBLATOR INTERVENTION (PCI-R)  11/20/2014   Procedure: PERCUTANEOUS CORONARY ROTOBLATOR INTERVENTION (PCI-R);  Surgeon: [name], MD;  Location: [facility] CATH LAB;  Service: Cardiovascular;;  . SHOULDER ARTHROSCOPY Left ~ 2011  . TEE WITHOUT CARDIOVERSION N/A 01/09/2016   Procedure: TRANSESOPHAGEAL ECHOCARDIOGRAM (TEE);  Surgeon: Rexene Alberts, MD;  Location: [facility];  Service: Open Heart Surgery;  Laterality: N/A;       Home Medications    Prior to Admission medications   Medication Sig Start Date End Date Taking? Authorizing Provider  acetaminophen (TYLENOL) 500 MG tablet Take 1,000 mg by mouth every 6 (six) hours as needed (pain).   Yes [provider]  albuterol (PROVENTIL HFA;VENTOLIN HFA) 108 (90 BASE) MCG/ACT inhaler Inhale 2 puffs into the lungs every 6 (six) hours as needed for wheezing or shortness of breath.   Yes [provider]  albuterol (PROVENTIL) (2.5 MG/3ML) 0.083% nebulizer solution Take 2.5 mg by nebulization every 6 (six) hours as needed for wheezing or shortness of breath.   Yes [provider]  aspirin EC 81 MG tablet Take 81 mg by mouth at bedtime.    Yes [provider]  citalopram (CELEXA) 20 MG tablet Take 20 mg by mouth at bedtime.    Yes [provider]  clopidogrel (PLAVIX) 75 MG tablet Take 1 tablet (75 mg total) by mouth daily. 01/14/16  Yes Tacy Dura, Donielle M, PA-C  fluticasone (FLONASE) 50 MCG/ACT nasal spray Place 1 spray into the nose 2 (two) times daily.    Yes [provider]  Fluticasone-Salmeterol (ADVAIR) 250-50 MCG/DOSE AEPB  Inhale 1 puff into the lungs every 12 (twelve) hours.   Yes [provider]  gabapentin (NEURONTIN) 400 MG capsule Take 400 mg by mouth 3 (three) times daily.   Yes [provider]  insulin aspart (NOVOLOG FLEXPEN) 100 UNIT/ML FlexPen Inject 1-15 Units into the skin 3 (three) times daily as needed for high blood sugar (CBG >120). Per sliding scale written down at home   Yes [provider]  Melatonin 5 MG TABS Take 10 mg by mouth at bedtime.    Yes [provider]  metFORMIN (GLUCOPHAGE) 1000 MG tablet Take 1 tablet (1,000 mg total) by mouth 2 (two) times daily. 01/02/16  Yes Thurnell Lose, MD  metoprolol tartrate (LOPRESSOR) 50 MG tablet Take 50 mg by mouth 2 (two) times daily. 04/13/17  Yes [provider]  omeprazole (PRILOSEC) 20 MG capsule Take  20 mg by mouth 2 (two) times daily before a meal.   Yes [provider]  ramipril (ALTACE) 10 MG capsule Take 10 mg by mouth daily.   Yes [provider]  RANEXA 500 MG 12 hr tablet Take 1,000 mg by mouth 2 (two) times daily. 04/13/17  Yes [provider]  topiramate (TOPAMAX) 25 MG capsule Take 25 mg by mouth 2 (two) times daily.   Yes [provider]  pravastatin (PRAVACHOL) 20 MG tablet Take 1 tablet (20 mg total) by mouth every other day. 03/29/17 06/27/17  [name], MD    Family History Family History  Problem Relation Age of Onset  . Coronary artery disease Father 53    Social History Social History  Substance Use Topics  . Smoking status: Never Smoker  . Smokeless tobacco: Never Used  . Alcohol use No     Allergies   Imdur [isosorbide dinitrate]; Statins; Tramadol; Tricor [fenofibrate]; and Valproic acid   Review of Systems Review of Systems  All other systems reviewed and are negative.    Physical Exam Updated Vital Signs BP 124/64 (BP Location: Right Arm)   Pulse 85   Temp 98.4 F (36.9 C) (Oral)   Resp 13   Ht 5' 9"  (1.753 m)    Wt 104.8 kg (231 lb)   SpO2 97%   BMI 34.11 kg/m   Physical Exam  Constitutional: He is oriented to person, place, and time. He appears well-developed and well-nourished.  HENT:  Head: Normocephalic and atraumatic.  Eyes: Conjunctivae are normal.  Neck: Neck supple.  Cardiovascular: Normal rate and regular rhythm.   Pulmonary/Chest: Effort normal and breath sounds normal.  Abdominal: Soft. Bowel sounds are normal.  Musculoskeletal: Normal range of motion.  Neurological: He is alert and oriented to person, place, and time.  Skin: Skin is warm and dry.  Psychiatric: He has a normal mood and affect. His behavior is normal.  Nursing note and vitals reviewed.    ED Treatments / Results  Labs (all labs ordered are listed, but only abnormal results are displayed) Labs Reviewed  CBC WITH DIFFERENTIAL/PLATELET  BASIC METABOLIC PANEL    EKG  EKG Interpretation None       Radiology No results found.  Procedures Procedures (including critical care time)  Medications Ordered in ED Medications - No data to display   Initial Impression / Assessment and Plan / ED Course  I have reviewed the triage vital signs and the nursing notes.  Pertinent labs & imaging results that were available during my care of the patient were reviewed by me and considered in my medical decision making (see chart for details).     Patient is experiencing no gross neurological deficits. His vital signs are within normal limits. I suspect a TIA. Will get basic screening labs and CT head. He has a neurologist appointment on June 12.    1845:  Patient has exhibited no neurological deficits in the ED. Discussed test findings with the patient is wife. He has not taking any of his diabetes medicine today. I strongly encouraged patient to be vigilant on his medications.  Final Clinical Impressions(s) / ED Diagnoses   Final diagnoses:  Numbness    New Prescriptions New Prescriptions   No medications  on file     Nat Christen, MD 04/18/17 [place]    Nat Christen, MD 04/18/17 1924

## 2017-04-18 NOTE — ED Triage Notes (Signed)
Pt c/o "numbness feeling" in his entire head and left sided weakness that started this morning when he woke up at 0900. Pt reports he felt normal prior to going to bed last night. LKW last night at 2321. Wife reports when she returned home from church today pt seemed slightly confused. Pt A&O x 4 at this time. Grips equal, no arm or leg drift, no facial droop, normal gaze, speech clear in triage.

## 2017-04-18 NOTE — ED Notes (Addendum)
While Probation officer at nurses station, Probation officer overheard patients wife yelling loudly.  When writer went into patients room, Patient and wife were standing at the side of the stretcher and patients wife looked at Probation officer and began to yell at [employer], 'I dont want you in here, you can leave now, I want a patient advocate". Writer told patient I needed to hand his IV bag up before I leave.  Patient wife Jerked the IV bag out of writers hand and stated to Probation officer "I dont need you to hang it up, I can do it. If I took it down I can hang it up". While patients wife was yelling at Probation officer, Probation officer informed visitor, to not yell at Probation officer. Patients wife continued to yell at Probation officer. Writer walked out of patients room. Writer informed MD and charge nurse, charge nurse in to speak with patient.

## 2017-08-02 ENCOUNTER — Other Ambulatory Visit: Payer: Self-pay | Admitting: *Deleted

## 2017-08-02 MED ORDER — PRAVASTATIN SODIUM 20 MG PO TABS: 20.0000 mg | ORAL_TABLET | ORAL | 1 refills | Status: DC

## 2017-08-05 ENCOUNTER — Other Ambulatory Visit: Payer: Self-pay | Admitting: Cardiology

## 2017-08-05 ENCOUNTER — Ambulatory Visit (INDEPENDENT_AMBULATORY_CARE_PROVIDER_SITE_OTHER): Payer: Medicare Other | Admitting: Cardiology

## 2017-08-05 ENCOUNTER — Encounter: Payer: Self-pay | Admitting: Cardiology

## 2017-08-05 ENCOUNTER — Telehealth: Payer: Self-pay | Admitting: Cardiology

## 2017-08-05 VITALS — BP 137/80 | HR 69 | Ht 69.0 in | Wt 239.8 lb

## 2017-08-05 DIAGNOSIS — I25119 Atherosclerotic heart disease of native coronary artery with unspecified angina pectoris: Secondary | ICD-10-CM | POA: Diagnosis not present

## 2017-08-05 DIAGNOSIS — I10 Essential (primary) hypertension: Secondary | ICD-10-CM

## 2017-08-05 DIAGNOSIS — I200 Unstable angina: Secondary | ICD-10-CM

## 2017-08-05 DIAGNOSIS — E782 Mixed hyperlipidemia: Secondary | ICD-10-CM | POA: Diagnosis not present

## 2017-08-05 DIAGNOSIS — Z789 Other specified health status: Secondary | ICD-10-CM

## 2017-08-05 DIAGNOSIS — I779 Disorder of arteries and arterioles, unspecified: Secondary | ICD-10-CM

## 2017-08-05 DIAGNOSIS — I739 Peripheral vascular disease, unspecified: Secondary | ICD-10-CM

## 2017-08-05 MED ORDER — RAMIPRIL 5 MG PO CAPS: 5.0000 mg | ORAL_CAPSULE | Freq: Every day | ORAL | 1 refills | Status: DC

## 2017-08-05 NOTE — Progress Notes (Signed)
Cardiology Office Note  Date: 08/05/2017   ID: [name] [name] 12-Jun-1956, MRN [id number]  PCP: Deloria Lair., MD  Primary Cardiologist: Rozann Lesches, MD   Chief Complaint  Patient presents with  . Coronary Artery Disease    History of Present Illness: [name] [name] is a 61 y.o. male last seen in May. Recent records were reviewed indicating ER visit at [facility] with chest pain. He was observed in the ER with negative troponin T levels. Lab work is outlined below. ECG showed no acute ST segment abnormalities. He presents with his wife today for evaluation. He tells me that he is actually been having recurring angina symptoms for several weeks now. He also has had episodes of orthostatic dizziness, and fact this symptom precipitated a second ER visit recently at which time he had a brain MRI that did not show any acute findings, and he was also noted to be borderline orthostatic.  He reports compliance with medications which include dual antiplatelet therapy. He has been using nitroglycerin but states it is not been particularly helpful. Antianginal regimen includes beta blocker and Ranexa.  His last PCI was in [place] back in January of this year. He was noted to have patent bypass grafts at that time but underwent intervention with stent placement to the first obtuse marginal with unstable angina symptoms (non-bypassed vessel).  I reviewed his recent ECG and lab work.  Past Medical History:  Diagnosis Date  . Anxiety   . Arthritis   . Asthma   . Cataract    Right eye  . Chronic lower back pain   . 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 Coronary atherosclerosis of native coronary artery    a. LAD & OM stenting;  b. 2007 Cath: nonobs dzs, patent stents;  c. 07/2012 neg MV, EF 61%.  d. 11/20/14: [place] s/p  PCI w/ DES to pLAD and DES to OM1   . Essential hypertension, benign   . GERD  (gastroesophageal reflux disease)   . Hearing loss of left ear   . History of gout   . History of hiatal hernia   . Hypercholesteremia   . Lumbar herniated disc   . MI, old   . Migraine   . OSA on CPAP   . 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 Thrombocytopenia ([place])   . TIA (transient ischemic attack) ~ 2010  . Type 2 diabetes mellitus ([place])     Past Surgical History:  Procedure Laterality Date  . ANTERIOR CERVICAL DECOMP/DISCECTOMY FUSION  1998   "C3-4"  . CARDIAC CATHETERIZATION  "several"  . CARDIAC CATHETERIZATION N/A 12/30/2015   Procedure: Left Heart Cath and Coronary Angiography;  Surgeon: [name], MD;  Location: [place] [facility];  Service: Cardiovascular;  Laterality: N/A;  . CARPAL TUNNEL RELEASE Bilateral 2005  . COLONOSCOPY    . CORONARY ANGIOPLASTY    . CORONARY ANGIOPLASTY WITH STENT PLACEMENT  2002; 2003; 11/20/2014   "I have 4 stents after today" (11/20/2014)  . CORONARY ARTERY BYPASS GRAFT N/A 01/09/2016   Procedure: CORONARY ARTERY BYPASS GRAFTING (CABG) X 3 UTILIZING RIGHT AND LEFT INTERNAL MAMMARY ARTERY AND ENDOSCOPICALLY HARVESTED SAPHENEOUS VEIN.;  Surgeon: Rexene Alberts, MD;  Location: [place];  Service: Open Heart Surgery;  Laterality: N/A;  . ESOPHAGOGASTRODUODENOSCOPY    .  KNEE SURGERY Left 02/2012   "scraped; open"  . LEFT HEART CATHETERIZATION WITH CORONARY ANGIOGRAM N/A 07/20/2012   Procedure: LEFT HEART CATHETERIZATION WITH CORONARY ANGIOGRAM;  Surgeon: [name], MD;  Location: [place] [facility] C[facility] LAB;  Service: Cardiovascular;  Laterality: N/A;  . LEFT HEART CATHETERIZATION WITH CORONARY ANGIOGRAM N/A 11/20/2014   Procedure: LEFT HEART CATHETERIZATION WITH CORONARY ANGIOGRAM;  Surgeon: [name], MD;  Location: [facility] CATH LAB;  Service: Cardiovascular;  Laterality: N/A;  . LEFT HEART CATHETERIZATION WITH CORONARY ANGIOGRAM N/A 11/26/2014   Procedure: LEFT HEART CATHETERIZATION WITH  CORONARY ANGIOGRAM;  Surgeon: [name], MD;  Location: [facility] CATH LAB;  Service: Cardiovascular;  Laterality: N/A;  . NEUROPLASTY / TRANSPOSITION ULNAR NERVE AT ELBOW Right ~ 2012  . PERCUTANEOUS CORONARY ROTOBLATOR INTERVENTION (PCI-R)  11/20/2014   Procedure: PERCUTANEOUS CORONARY ROTOBLATOR INTERVENTION (PCI-R);  Surgeon: [name], MD;  Location: [facility] CATH LAB;  Service: Cardiovascular;;  . SHOULDER ARTHROSCOPY Left ~ 2011  . TEE WITHOUT CARDIOVERSION N/A 01/09/2016   Procedure: TRANSESOPHAGEAL ECHOCARDIOGRAM (TEE);  Surgeon: Rexene Alberts, MD;  Location: [place];  Service: Open Heart Surgery;  Laterality: N/A;    Current Outpatient Prescriptions  Medication Sig Dispense Refill  . acetaminophen (TYLENOL) 500 MG tablet Take 1,000 mg by mouth every 6 (six) hours as needed (pain).    [name] albuterol (PROVENTIL HFA;VENTOLIN HFA) 108 (90 BASE) MCG/ACT inhaler Inhale 2 puffs into the lungs every 6 (six) hours as needed for wheezing or shortness of breath.    [name] albuterol (PROVENTIL) (2.5 MG/3ML) 0.083% nebulizer solution Take 2.5 mg by nebulization every 6 (six) hours as needed for wheezing or shortness of breath.    [name] aspirin EC 81 MG tablet Take 81 mg by mouth at bedtime.     . citalopram (CELEXA) 20 MG tablet Take 20 mg by mouth at bedtime.     . clopidogrel (PLAVIX) 75 MG tablet Take 1 tablet (75 mg total) by mouth daily. 30 tablet 1  . fluticasone (FLONASE) 50 MCG/ACT nasal spray Place 1 spray into the nose 2 (two) times daily.     . Fluticasone-Salmeterol (ADVAIR) 250-50 MCG/DOSE AEPB Inhale 1 puff into the lungs every 12 (twelve) hours.    . gabapentin (NEURONTIN) 300 MG capsule Take 300 mg by mouth 3 (three) times daily.  2  . insulin aspart (NOVOLOG FLEXPEN) 100 UNIT/ML FlexPen Inject 1-15 Units into the skin 3 (three) times daily as needed for high blood sugar (CBG >120). Per sliding scale written down at home    . Insulin Degludec (TRESIBA FLEXTOUCH) 200 UNIT/ML SOPN Inject 60-70 Units  into the skin 2 (two) times daily. 70 units in the morning and 60 units at bedtime    . Melatonin 5 MG TABS Take 10 mg by mouth at bedtime.     . metFORMIN (GLUCOPHAGE) 1000 MG tablet Take 1 tablet (1,000 mg total) by mouth 2 (two) times daily. 60 tablet 11  . metoprolol tartrate (LOPRESSOR) 50 MG tablet Take 50 mg by mouth 2 (two) times daily.    [name] omeprazole (PRILOSEC) 20 MG capsule Take 20 mg by mouth 2 (two) times daily before a meal.    . pravastatin (PRAVACHOL) 20 MG tablet Take 20 mg by mouth daily.    [name] RANEXA 500 MG 12 hr tablet Take 1,000 mg by mouth 2 (two) times daily.    [name] topiramate (TOPAMAX) 25 MG capsule Take 25 mg by mouth 2 (two) times daily.    [name]  ramipril (ALTACE) 5 MG capsule Take 1 capsule (5 mg total) by mouth daily. 90 capsule 1   No current facility-administered medications for this visit.    Allergies:  Imdur [isosorbide dinitrate]; Statins; Tramadol; Tricor [fenofibrate]; and Valproic acid   Social History: The patient  reports that he has never smoked. He has never used smokeless tobacco. He reports that he does not drink alcohol or use drugs.   Family History: The patient's family history includes Coronary artery disease (age of onset: 93) in his father.   ROS:  Please see the history of present illness. Otherwise, complete review of systems is positive for situational stress with family matters, arthritis symptoms.  All other systems are reviewed and negative.   Physical Exam: VS:  BP 137/80 Comment: left arm/large cuff/sitting  Pulse 69   Ht 5' 9"  (1.753 m)   Wt 239 lb 12.8 oz (108.8 kg)   BMI 35.41 kg/m , BMI Body mass index is 35.41 kg/m.  Wt Readings from Last 3 Encounters:  08/05/17 239 lb 12.8 oz (108.8 kg)  04/18/17 231 lb (104.8 kg)  03/29/17 231 lb 3.2 oz (104.9 kg)    General: Obese male, appears comfortable at rest. HEENT: Conjunctiva and lids normal, oropharynx clear. Neck: Supple, no elevated JVP or carotid bruits, no thyromegaly. Lungs:  Clear to auscultation, nonlabored breathing at rest. Cardiac: Regular rate and rhythm, no S3 or significant systolic murmur, no pericardial rub. Abdomen: Soft, nontender, bowel sounds present, no guarding or rebound. Extremities: Mild leg edema, distal pulses 2+. Skin: Warm and dry. Musculoskeletal: No kyphosis. Neuropsychiatric: Alert and oriented x3, affect grossly appropriate.  ECG: I personally reviewed the tracing from 08/02/2017 which showed sinus rhythm with decreased R wave progression, leftward axis, nonspecific T-wave changes.  Recent Labwork: 04/18/2017: BUN 23; Creatinine, Ser 1.32; Hemoglobin 15.7; Platelets 108; Potassium 4.7; Sodium 137     Component Value Date/Time   CHOL 230 (H) 01/02/2016 0407   TRIG 279 (H) 01/02/2016 0407   HDL 30 (L) 01/02/2016 0407   CHOLHDL 7.7 01/02/2016 0407   VLDL 56 (H) 01/02/2016 0407   LDLCALC 144 (H) 01/02/2016 0407  September 2018: BUN 16, creatinine 1.07, potassium 4.6, AST 20.5, ALT 20, troponin T less than 0.01, hemoglobin 14.8, platelets 100  Other Studies Reviewed Today:  Cardiac catheterization and PCI 11/18/2016 ([facility]):  Lat 1st Mrg-1 lesion 75% stenosed.   Lat 1st Mrg-2 lesion 20% stenosed.   Prox Cx to Mid Cx lesion 35% stenosed.   1st Diag lesion 50% stenosed.   Ost LAD to Mid LAD lesion 50% stenosed.   Dist LAD lesion 100% stenosed.   Mid RCA lesion 25% stenosed.   Ost RPDA to RPDA lesion 100% stenosed.   Origin lesion 25% stenosed.   1st Mrg lesion 75% stenosed.   Successful PCI and stenting to the higher branch of the 1st marginal      branch which was not vascularized.   Patent previous grafts with severe native coronary disease.    RECOMMENDATIONS:  1. Aspirin indefinitely    2. Plavix indefinitely.    3. Aggressive risk factor modification medical therapy.  Chest x-ray 08/02/2017 ([place]): No acute cardiopulmonary disease. Aortic  atherosclerosis.  Echocardiogram 03/26/2017 ([place]): LVEF 55-60% with normal diastolic function, mild left atrial enlargement, normal right ventricular contraction, mild to moderate aortic regurgitation, trace mitral regurgitation, mild tricuspid regurgitation, no pericardial effusion.  Carotid Dopplers 03/26/2017 [facility]): Less than  50% bilateral ICA stenoses.  Assessment and Plan:  1. Accelerating angina symptoms as outlined above, on reasonable medical therapy including dual antiplatelet regimen and Ranexa. Recent ECG was nonspecific at ER visit and troponin T level was negative. We have discussed options for further assessment and plan is to proceed with a diagnostic cardiac catheterization with eye toward revascularization strategies. He is in agreement to proceed.  2. Multivessel CAD status post CABG with patent bypass grafts documented at angiography earlier this year in [place], status post stent placement to the first obtuse marginal at that time (non-bypassed vessel).  3. Hyperlipidemia with history of statin intolerance, although tolerating Pravachol.  4. Essential hypertension, blood pressure control is reasonable today.  5. Carotid artery disease, less than 50% bilateral ICA stenoses by carotid Dopplers earlier this year.  Current medicines were reviewed with the patient today.  Disposition: Follow-up after cardiac catheterization.  Signed, [name], MD, [facility] 08/05/2017 4:19 PM    [place] [facility] [place], [place],  [address] Phone: [phone]; Fax: [phone]

## 2017-08-05 NOTE — Patient Instructions (Addendum)
Your physician recommends that you schedule a follow-up appointment in: [place] has recommended you make the following change in your medication:   DECREASE RAMIPRIL 5 MG [place] [id number] Dept: [phone] Loc: [place]  08/05/2017  You are scheduled for a CATHETERIZATION WITH DR END Monday 08/09/17   1. Please arrive at the [facility] (Main Entrance A) at [facility]: [address] [place], [place] [address] at 11AM (two hours before your procedure to ensure your preparation). Free valet parking service is available.   Special note: Every effort is made to have your procedure done on time. Please understand that emergencies sometimes delay scheduled procedures.  4. Medication instructions in preparation for your procedure: HOLD DIABETIC MEDICATIONS THE NIGHT BEFORE YOUR PROCEDURE   *For reference purposes while preparing patient instructions.   Delete this med list prior to printing instructions for patient.*  On the morning of your procedure, take your morning medicines NOT listed above.  You may use sips of water.   5. Plan for one night stay--bring personal belongings. 6. Bring a current list of your medications and current insurance cards. 7. You MUST have a responsible person to drive you home. 8. Someone MUST be with you the first 24 hours after you arrive home or your discharge will be delayed. 9. Please wear clothes that are easy to get on and off and wear slip-on shoes.  Thank you for allowing [place] to care for you!   -- [facility]

## 2017-08-05 NOTE — Telephone Encounter (Signed)
Pre-cert Verification for the following procedure   [facility] 10/1 @ 1PM

## 2017-08-06 ENCOUNTER — Telehealth: Payer: Self-pay

## 2017-08-06 NOTE — Telephone Encounter (Signed)
Patient contacted pre-catheterization at [facility] scheduled for:  08/09/2017 @ 1300 Verified arrival time and place: [facility] @ 1100 Confirmed AM meds to be taken pre-cath with sip of water: Take ASA and Plavix prior to arrival Night before cath-take half amount Ins Degludec Last dose of metformin will be Sunday AM-then hold Notified to hold all diabetic medication am of procedure Confirmed patient has responsible person to drive home post procedure and observe patient for 24 hours:  yes Addl concerns:  Pt indicates understanding of medication education

## 2017-08-09 ENCOUNTER — Ambulatory Visit (HOSPITAL_COMMUNITY)
Admission: RE | Disposition: A | Discharge status: Home / Self Care | Payer: Self-pay | Source: Ambulatory Visit | Attending: Internal Medicine

## 2017-08-09 ENCOUNTER — Ambulatory Visit (HOSPITAL_COMMUNITY)
Admission: RE | Admit: 2017-08-09 | Discharge: 2017-08-09 | Disposition: A | Discharge status: Home / Self Care | Payer: Medicare Other | Source: Ambulatory Visit | Attending: Internal Medicine | Admitting: Internal Medicine

## 2017-08-09 DIAGNOSIS — Z8673 Personal history of transient ischemic attack (TIA), and cerebral infarction without residual deficits: Secondary | ICD-10-CM | POA: Diagnosis not present

## 2017-08-09 DIAGNOSIS — K219 Gastro-esophageal reflux disease without esophagitis: Secondary | ICD-10-CM | POA: Diagnosis not present

## 2017-08-09 DIAGNOSIS — T82855A Stenosis of coronary artery stent, initial encounter: Secondary | ICD-10-CM | POA: Diagnosis not present

## 2017-08-09 DIAGNOSIS — Z794 Long term (current) use of insulin: Secondary | ICD-10-CM | POA: Insufficient documentation

## 2017-08-09 DIAGNOSIS — I25118 Atherosclerotic heart disease of native coronary artery with other forms of angina pectoris: Secondary | ICD-10-CM

## 2017-08-09 DIAGNOSIS — G8929 Other chronic pain: Secondary | ICD-10-CM | POA: Insufficient documentation

## 2017-08-09 DIAGNOSIS — M545 Low back pain: Secondary | ICD-10-CM | POA: Insufficient documentation

## 2017-08-09 DIAGNOSIS — I252 Old myocardial infarction: Secondary | ICD-10-CM | POA: Insufficient documentation

## 2017-08-09 DIAGNOSIS — Z7902 Long term (current) use of antithrombotics/antiplatelets: Secondary | ICD-10-CM | POA: Diagnosis not present

## 2017-08-09 DIAGNOSIS — Z7982 Long term (current) use of aspirin: Secondary | ICD-10-CM | POA: Insufficient documentation

## 2017-08-09 DIAGNOSIS — I2582 Chronic total occlusion of coronary artery: Secondary | ICD-10-CM | POA: Insufficient documentation

## 2017-08-09 DIAGNOSIS — I2584 Coronary atherosclerosis due to calcified coronary lesion: Secondary | ICD-10-CM | POA: Diagnosis not present

## 2017-08-09 DIAGNOSIS — M109 Gout, unspecified: Secondary | ICD-10-CM | POA: Diagnosis not present

## 2017-08-09 DIAGNOSIS — I25110 Atherosclerotic heart disease of native coronary artery with unstable angina pectoris: Secondary | ICD-10-CM | POA: Diagnosis not present

## 2017-08-09 DIAGNOSIS — E119 Type 2 diabetes mellitus without complications: Secondary | ICD-10-CM | POA: Diagnosis not present

## 2017-08-09 DIAGNOSIS — G4733 Obstructive sleep apnea (adult) (pediatric): Secondary | ICD-10-CM | POA: Insufficient documentation

## 2017-08-09 DIAGNOSIS — Y831 Surgical operation with implant of artificial internal device as the cause of abnormal reaction of the patient, or of later complication, without mention of misadventure at the time of the procedure: Secondary | ICD-10-CM | POA: Insufficient documentation

## 2017-08-09 DIAGNOSIS — I6523 Occlusion and stenosis of bilateral carotid arteries: Secondary | ICD-10-CM | POA: Diagnosis not present

## 2017-08-09 DIAGNOSIS — I200 Unstable angina: Secondary | ICD-10-CM | POA: Diagnosis present

## 2017-08-09 DIAGNOSIS — E7800 Pure hypercholesterolemia, unspecified: Secondary | ICD-10-CM | POA: Insufficient documentation

## 2017-08-09 DIAGNOSIS — Z7951 Long term (current) use of inhaled steroids: Secondary | ICD-10-CM | POA: Insufficient documentation

## 2017-08-09 DIAGNOSIS — M1990 Unspecified osteoarthritis, unspecified site: Secondary | ICD-10-CM | POA: Diagnosis not present

## 2017-08-09 DIAGNOSIS — Z951 Presence of aortocoronary bypass graft: Secondary | ICD-10-CM | POA: Insufficient documentation

## 2017-08-09 DIAGNOSIS — G43909 Migraine, unspecified, not intractable, without status migrainosus: Secondary | ICD-10-CM | POA: Insufficient documentation

## 2017-08-09 DIAGNOSIS — I10 Essential (primary) hypertension: Secondary | ICD-10-CM | POA: Diagnosis not present

## 2017-08-09 DIAGNOSIS — F419 Anxiety disorder, unspecified: Secondary | ICD-10-CM | POA: Insufficient documentation

## 2017-08-09 DIAGNOSIS — J45909 Unspecified asthma, uncomplicated: Secondary | ICD-10-CM | POA: Insufficient documentation

## 2017-08-09 HISTORY — PX: LEFT HEART CATH AND CORS/GRAFTS ANGIOGRAPHY: CATH118250

## 2017-08-09 LAB — GLUCOSE, CAPILLARY
GLUCOSE-CAPILLARY: 149 mg/dL — AB (ref 65–99)
GLUCOSE-CAPILLARY: 104 mg/dL — AB (ref 65–99)

## 2017-08-09 LAB — PROTIME-INR
INR: 1.11
PROTHROMBIN TIME: 14.3 s (ref 11.4–15.2)

## 2017-08-09 SURGERY — LEFT HEART CATH AND CORS/GRAFTS ANGIOGRAPHY
Anesthesia: LOCAL

## 2017-08-09 MED ORDER — HEPARIN (PORCINE) IN NACL 2-0.9 UNIT/ML-% IJ SOLN
INTRAMUSCULAR | Status: AC | PRN
  Administered 2017-08-09: 1000 mL

## 2017-08-09 MED ORDER — VERAPAMIL HCL 2.5 MG/ML IV SOLN
INTRAVENOUS | Status: AC
  Filled 2017-08-09: qty 2

## 2017-08-09 MED ORDER — HEPARIN SODIUM (PORCINE) 1000 UNIT/ML IJ SOLN
INTRAMUSCULAR | Status: AC
  Filled 2017-08-09: qty 1

## 2017-08-09 MED ORDER — MIDAZOLAM HCL 2 MG/2ML IJ SOLN
INTRAMUSCULAR | Status: AC
  Filled 2017-08-09: qty 2

## 2017-08-09 MED ORDER — SODIUM CHLORIDE 0.9% FLUSH: 3.0000 mL | Freq: Two times a day (BID) | INTRAVENOUS | Status: DC

## 2017-08-09 MED ORDER — FENTANYL CITRATE (PF) 100 MCG/2ML IJ SOLN
INTRAMUSCULAR | Status: AC
  Filled 2017-08-09: qty 2

## 2017-08-09 MED ORDER — IOPAMIDOL (ISOVUE-370) INJECTION 76%
INTRAVENOUS | Status: AC
  Filled 2017-08-09: qty 125

## 2017-08-09 MED ORDER — LIDOCAINE HCL 2 % IJ SOLN
INTRAMUSCULAR | Status: AC
  Filled 2017-08-09: qty 10

## 2017-08-09 MED ORDER — METFORMIN HCL 1000 MG PO TABS
1000.0000 mg | ORAL_TABLET | Freq: Two times a day (BID) | ORAL | 11 refills | Status: DC
Start: 2017-08-12 — End: 2018-11-23

## 2017-08-09 MED ORDER — SODIUM CHLORIDE 0.9 % IV SOLN: 250.0000 mL | INTRAVENOUS | Status: DC | PRN

## 2017-08-09 MED ORDER — IOPAMIDOL (ISOVUE-370) INJECTION 76%
INTRAVENOUS | Status: DC | PRN
  Administered 2017-08-09: 90 mL via INTRAVENOUS

## 2017-08-09 MED ORDER — HEPARIN SODIUM (PORCINE) 1000 UNIT/ML IJ SOLN
INTRAMUSCULAR | Status: DC | PRN
  Administered 2017-08-09: 5000 [IU] via INTRAVENOUS

## 2017-08-09 MED ORDER — MIDAZOLAM HCL 2 MG/2ML IJ SOLN
INTRAMUSCULAR | Status: DC | PRN
  Administered 2017-08-09: 1 mg via INTRAVENOUS

## 2017-08-09 MED ORDER — VERAPAMIL HCL 2.5 MG/ML IV SOLN
INTRAVENOUS | Status: DC | PRN
  Administered 2017-08-09: 10 mL via INTRA_ARTERIAL

## 2017-08-09 MED ORDER — SODIUM CHLORIDE 0.9% FLUSH: 3.0000 mL | INTRAVENOUS | Status: DC | PRN

## 2017-08-09 MED ORDER — FENTANYL CITRATE (PF) 100 MCG/2ML IJ SOLN
INTRAMUSCULAR | Status: DC | PRN
  Administered 2017-08-09: 50 ug via INTRAVENOUS

## 2017-08-09 MED ORDER — SODIUM CHLORIDE 0.9 % WEIGHT BASED INFUSION
3.0000 mL/kg/h | INTRAVENOUS | Status: AC
  Administered 2017-08-09: 3 mL/kg/h via INTRAVENOUS

## 2017-08-09 MED ORDER — ASPIRIN 81 MG PO CHEW: 81.0000 mg | CHEWABLE_TABLET | ORAL | Status: DC

## 2017-08-09 MED ORDER — SODIUM CHLORIDE 0.9 % WEIGHT BASED INFUSION: 1.0000 mL/kg/h | INTRAVENOUS | Status: DC

## 2017-08-09 MED ORDER — HEPARIN (PORCINE) IN NACL 2-0.9 UNIT/ML-% IJ SOLN
INTRAMUSCULAR | Status: AC
  Filled 2017-08-09: qty 1000

## 2017-08-09 MED ORDER — SODIUM CHLORIDE 0.9 % IV SOLN: INTRAVENOUS | Status: DC

## 2017-08-09 MED ORDER — LIDOCAINE HCL (PF) 1 % IJ SOLN
INTRAMUSCULAR | Status: DC | PRN
  Administered 2017-08-09: 2 mL

## 2017-08-09 SURGICAL SUPPLY — 12 items
CATH INFINITI 5 FR AL2 (CATHETERS) ×1 IMPLANT
CATH INFINITI 5 FR MPA2 (CATHETERS) ×1 IMPLANT
CATH INFINITI 5FR AL1 (CATHETERS) ×1 IMPLANT
CATH INFINITI 5FR MULTPACK ANG (CATHETERS) ×1 IMPLANT
DEVICE RAD COMP TR BAND LRG (VASCULAR PRODUCTS) ×1 IMPLANT
GLIDESHEATH SLEND SS 6F .021 (SHEATH) ×1 IMPLANT
GUIDEWIRE INQWIRE 1.5J.035X260 (WIRE) IMPLANT
INQWIRE 1.5J .035X260CM (WIRE) ×2
KIT HEART LEFT (KITS) ×2 IMPLANT
PACK CARDIAC CATHETERIZATION (CUSTOM PROCEDURE TRAY) ×2 IMPLANT
TRANSDUCER W/STOPCOCK (MISCELLANEOUS) ×2 IMPLANT
TUBING CIL FLEX 10 FLL-RA (TUBING) ×2 IMPLANT

## 2017-08-09 NOTE — Discharge Instructions (Signed)

## 2017-08-09 NOTE — H&P (View-Only) (Signed)
Cardiology Office Note  Date: 08/05/2017   ID: [name], [name] 05-26-1956, MRN [id number]  PCP: Deloria Lair., MD  Primary Cardiologist: Rozann Lesches, MD   Chief Complaint  Patient presents with  . Coronary Artery Disease    History of Present Illness: [name] [name] is a 61 y.o. male last seen in May. Recent records were reviewed indicating ER visit at [facility] with chest pain. He was observed in the ER with negative troponin T levels. Lab work is outlined below. ECG showed no acute ST segment abnormalities. He presents with his wife today for evaluation. He tells me that he is actually been having recurring angina symptoms for several weeks now. He also has had episodes of orthostatic dizziness, and fact this symptom precipitated a second ER visit recently at which time he had a brain MRI that did not show any acute findings, and he was also noted to be borderline orthostatic.  He reports compliance with medications which include dual antiplatelet therapy. He has been using nitroglycerin but states it is not been particularly helpful. Antianginal regimen includes beta blocker and Ranexa.  His last PCI was in [place] back in January of this year. He was noted to have patent bypass grafts at that time but underwent intervention with stent placement to the first obtuse marginal with unstable angina symptoms (non-bypassed vessel).  I reviewed his recent ECG and lab work.  Past Medical History:  Diagnosis Date  . Anxiety   . Arthritis   . Asthma   . Cataract    Right eye  . Chronic lower back pain   . 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 Coronary atherosclerosis of native coronary artery    a. LAD & OM stenting;  b. 2007 Cath: nonobs dzs, patent stents;  c. 07/2012 neg MV, EF 61%.  d. 11/20/14: [place] s/p  PCI w/ DES to pLAD and DES to OM1   . Essential hypertension, benign   . GERD  (gastroesophageal reflux disease)   . Hearing loss of left ear   . History of gout   . History of hiatal hernia   . Hypercholesteremia   . Lumbar herniated disc   . MI, old   . Migraine   . OSA on CPAP   . 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 Thrombocytopenia ([place])   . TIA (transient ischemic attack) ~ 2010  . Type 2 diabetes mellitus ([place])     Past Surgical History:  Procedure Laterality Date  . ANTERIOR CERVICAL DECOMP/DISCECTOMY FUSION  1998   "C3-4"  . CARDIAC CATHETERIZATION  "several"  . CARDIAC CATHETERIZATION N/A 12/30/2015   Procedure: Left Heart Cath and Coronary Angiography;  Surgeon: [name], MD;  Location: [facility] CV LAB;  Service: Cardiovascular;  Laterality: N/A;  . CARPAL TUNNEL RELEASE Bilateral 2005  . COLONOSCOPY    . CORONARY ANGIOPLASTY    . CORONARY ANGIOPLASTY WITH STENT PLACEMENT  2002; 2003; 11/20/2014   "I have 4 stents after today" (11/20/2014)  . CORONARY ARTERY BYPASS GRAFT N/A 01/09/2016   Procedure: CORONARY ARTERY BYPASS GRAFTING (CABG) X 3 UTILIZING RIGHT AND LEFT INTERNAL MAMMARY ARTERY AND ENDOSCOPICALLY HARVESTED SAPHENEOUS VEIN.;  Surgeon: Rexene Alberts, MD;  Location: [facility];  Service: Open Heart Surgery;  Laterality: N/A;  . ESOPHAGOGASTRODUODENOSCOPY    .  KNEE SURGERY Left 02/2012   "scraped; open"  . LEFT HEART CATHETERIZATION WITH CORONARY ANGIOGRAM N/A 07/20/2012   Procedure: LEFT HEART CATHETERIZATION WITH CORONARY ANGIOGRAM;  Surgeon: [name], MD;  Location: [facility] CATH LAB;  Service: Cardiovascular;  Laterality: N/A;  . LEFT HEART CATHETERIZATION WITH CORONARY ANGIOGRAM N/A 11/20/2014   Procedure: LEFT HEART CATHETERIZATION WITH CORONARY ANGIOGRAM;  Surgeon: [name], MD;  Location: [facility] CATH LAB;  Service: Cardiovascular;  Laterality: N/A;  . LEFT HEART CATHETERIZATION WITH CORONARY ANGIOGRAM N/A 11/26/2014   Procedure: LEFT HEART CATHETERIZATION WITH  CORONARY ANGIOGRAM;  Surgeon: [name], MD;  Location: [facility] CATH LAB;  Service: Cardiovascular;  Laterality: N/A;  . NEUROPLASTY / TRANSPOSITION ULNAR NERVE AT ELBOW Right ~ 2012  . PERCUTANEOUS CORONARY ROTOBLATOR INTERVENTION (PCI-R)  11/20/2014   Procedure: PERCUTANEOUS CORONARY ROTOBLATOR INTERVENTION (PCI-R);  Surgeon: [name], MD;  Location: [facility] CATH LAB;  Service: Cardiovascular;;  . SHOULDER ARTHROSCOPY Left ~ 2011  . TEE WITHOUT CARDIOVERSION N/A 01/09/2016   Procedure: TRANSESOPHAGEAL ECHOCARDIOGRAM (TEE);  Surgeon: Rexene Alberts, MD;  Location: [facility];  Service: Open Heart Surgery;  Laterality: N/A;    Current Outpatient Prescriptions  Medication Sig Dispense Refill  . acetaminophen (TYLENOL) 500 MG tablet Take 1,000 mg by mouth every 6 (six) hours as needed (pain).    [name] albuterol (PROVENTIL HFA;VENTOLIN HFA) 108 (90 BASE) MCG/ACT inhaler Inhale 2 puffs into the lungs every 6 (six) hours as needed for wheezing or shortness of breath.    [name] albuterol (PROVENTIL) (2.5 MG/3ML) 0.083% nebulizer solution Take 2.5 mg by nebulization every 6 (six) hours as needed for wheezing or shortness of breath.    [name] aspirin EC 81 MG tablet Take 81 mg by mouth at bedtime.     . citalopram (CELEXA) 20 MG tablet Take 20 mg by mouth at bedtime.     . clopidogrel (PLAVIX) 75 MG tablet Take 1 tablet (75 mg total) by mouth daily. 30 tablet 1  . fluticasone (FLONASE) 50 MCG/ACT nasal spray Place 1 spray into the nose 2 (two) times daily.     . Fluticasone-Salmeterol (ADVAIR) 250-50 MCG/DOSE AEPB Inhale 1 puff into the lungs every 12 (twelve) hours.    . gabapentin (NEURONTIN) 300 MG capsule Take 300 mg by mouth 3 (three) times daily.  2  . insulin aspart (NOVOLOG FLEXPEN) 100 UNIT/ML FlexPen Inject 1-15 Units into the skin 3 (three) times daily as needed for high blood sugar (CBG >120). Per sliding scale written down at home    . Insulin Degludec (TRESIBA FLEXTOUCH) 200 UNIT/ML SOPN Inject 60-70 Units  into the skin 2 (two) times daily. 70 units in the morning and 60 units at bedtime    . Melatonin 5 MG TABS Take 10 mg by mouth at bedtime.     . metFORMIN (GLUCOPHAGE) 1000 MG tablet Take 1 tablet (1,000 mg total) by mouth 2 (two) times daily. 60 tablet 11  . metoprolol tartrate (LOPRESSOR) 50 MG tablet Take 50 mg by mouth 2 (two) times daily.    [name] omeprazole (PRILOSEC) 20 MG capsule Take 20 mg by mouth 2 (two) times daily before a meal.    . pravastatin (PRAVACHOL) 20 MG tablet Take 20 mg by mouth daily.    [name] RANEXA 500 MG 12 hr tablet Take 1,000 mg by mouth 2 (two) times daily.    [name] topiramate (TOPAMAX) 25 MG capsule Take 25 mg by mouth 2 (two) times daily.    [name]  ramipril (ALTACE) 5 MG capsule Take 1 capsule (5 mg total) by mouth daily. 90 capsule 1   No current facility-administered medications for this visit.    Allergies:  Imdur [isosorbide dinitrate]; Statins; Tramadol; Tricor [fenofibrate]; and Valproic acid   Social History: The patient  reports that he has never smoked. He has never used smokeless tobacco. He reports that he does not drink alcohol or use drugs.   Family History: The patient's family history includes Coronary artery disease (age of onset: 86) in his father.   ROS:  Please see the history of present illness. Otherwise, complete review of systems is positive for situational stress with family matters, arthritis symptoms.  All other systems are reviewed and negative.   Physical Exam: VS:  BP 137/80 Comment: left arm/large cuff/sitting  Pulse 69   Ht 5' 9"  (1.753 m)   Wt 239 lb 12.8 oz (108.8 kg)   BMI 35.41 kg/m , BMI Body mass index is 35.41 kg/m.  Wt Readings from Last 3 Encounters:  08/05/17 239 lb 12.8 oz (108.8 kg)  04/18/17 231 lb (104.8 kg)  03/29/17 231 lb 3.2 oz (104.9 kg)    General: Obese male, appears comfortable at rest. HEENT: Conjunctiva and lids normal, oropharynx clear. Neck: Supple, no elevated JVP or carotid bruits, no thyromegaly. Lungs:  Clear to auscultation, nonlabored breathing at rest. Cardiac: Regular rate and rhythm, no S3 or significant systolic murmur, no pericardial rub. Abdomen: Soft, nontender, bowel sounds present, no guarding or rebound. Extremities: Mild leg edema, distal pulses 2+. Skin: Warm and dry. Musculoskeletal: No kyphosis. Neuropsychiatric: Alert and oriented x3, affect grossly appropriate.  ECG: I personally reviewed the tracing from 08/02/2017 which showed sinus rhythm with decreased R wave progression, leftward axis, nonspecific T-wave changes.  Recent Labwork: 04/18/2017: BUN 23; Creatinine, Ser 1.32; Hemoglobin 15.7; Platelets 108; Potassium 4.7; Sodium 137     Component Value Date/Time   CHOL 230 (H) 01/02/2016 0407   TRIG 279 (H) 01/02/2016 0407   HDL 30 (L) 01/02/2016 0407   CHOLHDL 7.7 01/02/2016 0407   VLDL 56 (H) 01/02/2016 0407   LDLCALC 144 (H) 01/02/2016 0407  September 2018: BUN 16, creatinine 1.07, potassium 4.6, AST 20.5, ALT 20, troponin T less than 0.01, hemoglobin 14.8, platelets 100  Other Studies Reviewed Today:  Cardiac catheterization and PCI 11/18/2016 ([facility]):  Lat 1st Mrg-1 lesion 75% stenosed.   Lat 1st Mrg-2 lesion 20% stenosed.   Prox Cx to Mid Cx lesion 35% stenosed.   1st Diag lesion 50% stenosed.   Ost LAD to Mid LAD lesion 50% stenosed.   Dist LAD lesion 100% stenosed.   Mid RCA lesion 25% stenosed.   Ost RPDA to RPDA lesion 100% stenosed.   Origin lesion 25% stenosed.   1st Mrg lesion 75% stenosed.   Successful PCI and stenting to the higher branch of the 1st marginal      branch which was not vascularized.   Patent previous grafts with severe native coronary disease.    RECOMMENDATIONS:  1. Aspirin indefinitely    2. Plavix indefinitely.    3. Aggressive risk factor modification medical therapy.  Chest x-ray 08/02/2017 ([place]): No acute cardiopulmonary disease. Aortic  atherosclerosis.  Echocardiogram 03/26/2017 ([place] [facility] [place]): LVEF 55-60% with normal diastolic function, mild left atrial enlargement, normal right ventricular contraction, mild to moderate aortic regurgitation, trace mitral regurgitation, mild tricuspid regurgitation, no pericardial effusion.  Carotid Dopplers 03/26/2017 [facility]): Less than  50% bilateral ICA stenoses.  Assessment and Plan:  1. Accelerating angina symptoms as outlined above, on reasonable medical therapy including dual antiplatelet regimen and Ranexa. Recent ECG was nonspecific at ER visit and troponin T level was negative. We have discussed options for further assessment and plan is to proceed with a diagnostic cardiac catheterization with eye toward revascularization strategies. He is in agreement to proceed.  2. Multivessel CAD status post CABG with patent bypass grafts documented at angiography earlier this year in [place], status post stent placement to the first obtuse marginal at that time (non-bypassed vessel).  3. Hyperlipidemia with history of statin intolerance, although tolerating Pravachol.  4. Essential hypertension, blood pressure control is reasonable today.  5. Carotid artery disease, less than 50% bilateral ICA stenoses by carotid Dopplers earlier this year.  Current medicines were reviewed with the patient today.  Disposition: Follow-up after cardiac catheterization.  Signed, [name], MD, [facility] 08/05/2017 4:19 PM    [facility] [place], [place], [place] [address] Phone: [phone]; Fax: [phone]

## 2017-08-09 NOTE — Brief Op Note (Signed)
BRIEF CARDIAC CATHETERIZATION NOTE  DATE: 08/09/2017 TIME: 4:33 PM  PATIENT:  [name] [name]  61 y.o. male  PRE-OPERATIVE DIAGNOSIS:  Accelerating angina  POST-OPERATIVE DIAGNOSIS:  Same  PROCEDURE:  Procedure(s): LEFT HEART CATH AND CORS/GRAFTS ANGIOGRAPHY (N/A)  SURGEON:  Surgeon(s) and Role:    [name], MD - Primary  FINDINGS: 1. Severe native coronary artery disease, including moderate diffuse proximal/mid LAD disease, severe diffuse distal LAD disease, severe ostial stenosis of inferior branch of OM1 (supplied by free RIMA), and CTO of ostial rPDA. 2. Widely patent LIMA->LAD and SVG->rPDA. Free RIMI->inferior branch of OM1 has 30-40% ostial disease. 3. Patent stents in OM1. 4. Mildly elevated LVEDP.  RECOMMENDATIONS: 1. Medical therapy.  [name], MD [facility] Pager: [phone]

## 2017-08-09 NOTE — Interval H&P Note (Signed)
History and Physical Interval Note:  08/09/2017 3:32 PM  [name] [name]  has presented today for cardiac catheterization, with the diagnosis of accelerating angina. The various methods of treatment have been discussed with the patient and family. After consideration of risks, benefits and other options for treatment, the patient has consented to  Procedure(s): LEFT HEART CATH AND CORS/GRAFTS ANGIOGRAPHY (N/A) as a surgical intervention .  The patient's history has been reviewed, patient examined, no change in status, stable for surgery.  I have reviewed the patient's chart and labs.  Questions were answered to the patient's satisfaction.    Cath Lab Visit (complete for each Cath Lab visit)  Clinical Evaluation Leading to the Procedure:   ACS: No.  Non-ACS:    Anginal Classification: CCS III  Anti-ischemic medical therapy: Maximal Therapy (2 or more classes of medications)  Non-Invasive Test Results: No non-invasive testing performed  Prior CABG: Previous CABG  Jamillia Closson

## 2017-08-10 ENCOUNTER — Emergency Department (HOSPITAL_COMMUNITY): Payer: Medicare Other

## 2017-08-10 ENCOUNTER — Observation Stay (HOSPITAL_COMMUNITY)
Admission: EM | Admit: 2017-08-10 | Discharge: 2017-08-11 | Disposition: A | Discharge status: Home / Self Care | Payer: Medicare Other | Attending: Internal Medicine | Admitting: Internal Medicine

## 2017-08-10 ENCOUNTER — Telehealth: Payer: Self-pay | Admitting: *Deleted

## 2017-08-10 ENCOUNTER — Encounter (HOSPITAL_COMMUNITY): Payer: Self-pay | Admitting: Internal Medicine

## 2017-08-10 DIAGNOSIS — Z794 Long term (current) use of insulin: Secondary | ICD-10-CM | POA: Diagnosis not present

## 2017-08-10 DIAGNOSIS — J45909 Unspecified asthma, uncomplicated: Secondary | ICD-10-CM | POA: Diagnosis not present

## 2017-08-10 DIAGNOSIS — Z23 Encounter for immunization: Secondary | ICD-10-CM | POA: Diagnosis not present

## 2017-08-10 DIAGNOSIS — R079 Chest pain, unspecified: Secondary | ICD-10-CM | POA: Diagnosis present

## 2017-08-10 DIAGNOSIS — I252 Old myocardial infarction: Secondary | ICD-10-CM | POA: Diagnosis not present

## 2017-08-10 DIAGNOSIS — I10 Essential (primary) hypertension: Secondary | ICD-10-CM | POA: Diagnosis not present

## 2017-08-10 DIAGNOSIS — E119 Type 2 diabetes mellitus without complications: Secondary | ICD-10-CM | POA: Diagnosis not present

## 2017-08-10 DIAGNOSIS — E1165 Type 2 diabetes mellitus with hyperglycemia: Secondary | ICD-10-CM | POA: Insufficient documentation

## 2017-08-10 DIAGNOSIS — I25708 Atherosclerosis of coronary artery bypass graft(s), unspecified, with other forms of angina pectoris: Secondary | ICD-10-CM | POA: Diagnosis not present

## 2017-08-10 DIAGNOSIS — Z7902 Long term (current) use of antithrombotics/antiplatelets: Secondary | ICD-10-CM | POA: Diagnosis not present

## 2017-08-10 DIAGNOSIS — R112 Nausea with vomiting, unspecified: Secondary | ICD-10-CM | POA: Insufficient documentation

## 2017-08-10 DIAGNOSIS — Z79899 Other long term (current) drug therapy: Secondary | ICD-10-CM | POA: Insufficient documentation

## 2017-08-10 DIAGNOSIS — E7800 Pure hypercholesterolemia, unspecified: Secondary | ICD-10-CM | POA: Diagnosis not present

## 2017-08-10 DIAGNOSIS — Z951 Presence of aortocoronary bypass graft: Secondary | ICD-10-CM | POA: Insufficient documentation

## 2017-08-10 DIAGNOSIS — Z7982 Long term (current) use of aspirin: Secondary | ICD-10-CM | POA: Insufficient documentation

## 2017-08-10 DIAGNOSIS — Z8673 Personal history of transient ischemic attack (TIA), and cerebral infarction without residual deficits: Secondary | ICD-10-CM | POA: Insufficient documentation

## 2017-08-10 DIAGNOSIS — Z955 Presence of coronary angioplasty implant and graft: Secondary | ICD-10-CM | POA: Insufficient documentation

## 2017-08-10 DIAGNOSIS — R0789 Other chest pain: Secondary | ICD-10-CM | POA: Diagnosis present

## 2017-08-10 DIAGNOSIS — I209 Angina pectoris, unspecified: Principal | ICD-10-CM | POA: Insufficient documentation

## 2017-08-10 LAB — BASIC METABOLIC PANEL
Anion gap: 7 (ref 5–15)
BUN: 14 mg/dL (ref 6–20)
CALCIUM: 9.1 mg/dL (ref 8.9–10.3)
CO2: 25 mmol/L (ref 22–32)
CREATININE: 1.08 mg/dL (ref 0.61–1.24)
Chloride: 105 mmol/L (ref 101–111)
GFR calc non Af Amer: >60 mL/min (ref >60)
GLUCOSE: 204 mg/dL — AB (ref 65–99)
Potassium: 4.4 mmol/L (ref 3.5–5.1)
Sodium: 137 mmol/L (ref 135–145)

## 2017-08-10 LAB — LIPID PANEL
CHOL/HDL RATIO: 6.4 ratio
CHOLESTEROL: 174 mg/dL (ref 0–200)
HDL: 27 mg/dL — ABNORMAL LOW (ref >40)
LDL Cholesterol: 98 mg/dL (ref 0–99)
Triglycerides: 245 mg/dL — ABNORMAL HIGH (ref <150)
VLDL: 49 mg/dL — ABNORMAL HIGH (ref 0–40)

## 2017-08-10 LAB — CBC
HCT: 41.9 % (ref 39.0–52.0)
Hemoglobin: 14.9 g/dL (ref 13.0–17.0)
MCH: 32.1 pg (ref 26.0–34.0)
MCHC: 35.6 g/dL (ref 30.0–36.0)
MCV: 90.3 fL (ref 78.0–100.0)
PLATELETS: 102 10*3/uL — AB (ref 150–400)
RBC: 4.64 MIL/uL (ref 4.22–5.81)
RDW: 12.9 % (ref 11.5–15.5)
WBC: 3 10*3/uL — ABNORMAL LOW (ref 4.0–10.5)

## 2017-08-10 LAB — TSH: TSH: 1.28 u[IU]/mL (ref 0.350–4.500)

## 2017-08-10 LAB — TROPONIN I: TROPONIN I: 0.03 ng/mL — AB (ref <0.03)

## 2017-08-10 LAB — HEMOGLOBIN A1C
HEMOGLOBIN A1C: 6.5 % — AB (ref 4.8–5.6)
MEAN PLASMA GLUCOSE: 139.85 mg/dL

## 2017-08-10 LAB — GLUCOSE, CAPILLARY: GLUCOSE-CAPILLARY: 155 mg/dL — AB (ref 65–99)

## 2017-08-10 MED ORDER — ASPIRIN EC 81 MG PO TBEC
81.0000 mg | DELAYED_RELEASE_TABLET | Freq: Every day | ORAL | Status: DC
  Administered 2017-08-10: 81 mg via ORAL
  Filled 2017-08-10: qty 1

## 2017-08-10 MED ORDER — ENOXAPARIN SODIUM 120 MG/0.8ML ~~LOC~~ SOLN
1.0000 mg/kg | Freq: Once | SUBCUTANEOUS | Status: AC
  Administered 2017-08-10: 105 mg via SUBCUTANEOUS
  Filled 2017-08-10 (×2): qty 0.8

## 2017-08-10 MED ORDER — PRAVASTATIN SODIUM 10 MG PO TABS
20.0000 mg | ORAL_TABLET | Freq: Every day | ORAL | Status: DC
  Administered 2017-08-10: 20 mg via ORAL
  Filled 2017-08-10: qty 2

## 2017-08-10 MED ORDER — INSULIN GLARGINE 100 UNIT/ML ~~LOC~~ SOLN
60.0000 [IU] | Freq: Every day | SUBCUTANEOUS | Status: DC
  Administered 2017-08-10: 60 [IU] via SUBCUTANEOUS
  Filled 2017-08-10 (×2): qty 0.6

## 2017-08-10 MED ORDER — TOPIRAMATE 25 MG PO TABS
25.0000 mg | ORAL_TABLET | Freq: Two times a day (BID) | ORAL | Status: DC
  Administered 2017-08-10 – 2017-08-11 (×2): 25 mg via ORAL
  Filled 2017-08-10 (×2): qty 1

## 2017-08-10 MED ORDER — FLUTICASONE PROPIONATE 50 MCG/ACT NA SUSP
1.0000 | Freq: Every day | NASAL | Status: DC
  Administered 2017-08-10 – 2017-08-11 (×2): 1 via NASAL
  Filled 2017-08-10: qty 16

## 2017-08-10 MED ORDER — NITROGLYCERIN 2 % TD OINT
0.5000 [in_us] | TOPICAL_OINTMENT | Freq: Four times a day (QID) | TRANSDERMAL | Status: DC
  Administered 2017-08-10 – 2017-08-11 (×3): 0.5 [in_us] via TOPICAL
  Filled 2017-08-10 (×3): qty 1

## 2017-08-10 MED ORDER — ACETAMINOPHEN 500 MG PO TABS
1000.0000 mg | ORAL_TABLET | Freq: Four times a day (QID) | ORAL | Status: DC | PRN
  Administered 2017-08-11: 1000 mg via ORAL
  Filled 2017-08-10: qty 2

## 2017-08-10 MED ORDER — MOMETASONE FURO-FORMOTEROL FUM 200-5 MCG/ACT IN AERO
2.0000 | INHALATION_SPRAY | Freq: Two times a day (BID) | RESPIRATORY_TRACT | Status: DC
  Administered 2017-08-11: 2 via RESPIRATORY_TRACT
  Filled 2017-08-10 (×2): qty 8.8

## 2017-08-10 MED ORDER — CITALOPRAM HYDROBROMIDE 20 MG PO TABS
20.0000 mg | ORAL_TABLET | Freq: Every day | ORAL | Status: DC
  Administered 2017-08-10: 20 mg via ORAL
  Filled 2017-08-10: qty 1

## 2017-08-10 MED ORDER — ACETAMINOPHEN 325 MG PO TABS
650.0000 mg | ORAL_TABLET | Freq: Once | ORAL | Status: AC
  Administered 2017-08-10: 650 mg via ORAL
  Filled 2017-08-10: qty 2

## 2017-08-10 MED ORDER — INSULIN GLARGINE 100 UNIT/ML ~~LOC~~ SOLN
70.0000 [IU] | Freq: Every day | SUBCUTANEOUS | Status: DC
  Administered 2017-08-11: 70 [IU] via SUBCUTANEOUS
  Filled 2017-08-10 (×2): qty 0.7

## 2017-08-10 MED ORDER — PANTOPRAZOLE SODIUM 40 MG PO TBEC
40.0000 mg | DELAYED_RELEASE_TABLET | Freq: Every day | ORAL | Status: DC
  Administered 2017-08-10 – 2017-08-11 (×2): 40 mg via ORAL
  Filled 2017-08-10 (×2): qty 1

## 2017-08-10 MED ORDER — RAMIPRIL 5 MG PO CAPS
5.0000 mg | ORAL_CAPSULE | Freq: Every day | ORAL | Status: DC
  Administered 2017-08-10 – 2017-08-11 (×2): 5 mg via ORAL
  Filled 2017-08-10 (×2): qty 1

## 2017-08-10 MED ORDER — INSULIN ASPART 100 UNIT/ML ~~LOC~~ SOLN: 0.0000 [IU] | Freq: Every day | SUBCUTANEOUS | Status: DC

## 2017-08-10 MED ORDER — ALBUTEROL SULFATE (2.5 MG/3ML) 0.083% IN NEBU: 2.5000 mg | INHALATION_SOLUTION | Freq: Four times a day (QID) | RESPIRATORY_TRACT | Status: DC | PRN

## 2017-08-10 MED ORDER — ISOSORBIDE DINITRATE 10 MG PO TABS
10.0000 mg | ORAL_TABLET | Freq: Once | ORAL | Status: AC
Start: 2017-08-10 — End: 2017-08-10
  Administered 2017-08-10: 10 mg via ORAL
  Filled 2017-08-10: qty 1

## 2017-08-10 MED ORDER — INSULIN ASPART 100 UNIT/ML FLEXPEN: 1.0000 [IU] | PEN_INJECTOR | Freq: Three times a day (TID) | SUBCUTANEOUS | Status: DC | PRN

## 2017-08-10 MED ORDER — GABAPENTIN 300 MG PO CAPS
300.0000 mg | ORAL_CAPSULE | Freq: Three times a day (TID) | ORAL | Status: DC
  Administered 2017-08-10 – 2017-08-11 (×2): 300 mg via ORAL
  Filled 2017-08-10 (×2): qty 1

## 2017-08-10 MED ORDER — INSULIN ASPART 100 UNIT/ML ~~LOC~~ SOLN: 0.0000 [IU] | Freq: Three times a day (TID) | SUBCUTANEOUS | Status: DC

## 2017-08-10 MED ORDER — TOPIRAMATE 25 MG PO CPSP: 25.0000 mg | ORAL_CAPSULE | Freq: Two times a day (BID) | ORAL | Status: DC

## 2017-08-10 MED ORDER — MORPHINE SULFATE (PF) 2 MG/ML IV SOLN
2.0000 mg | INTRAVENOUS | Status: DC | PRN
  Administered 2017-08-11 (×3): 2 mg via INTRAVENOUS
  Filled 2017-08-10 (×3): qty 1

## 2017-08-10 MED ORDER — ALBUTEROL SULFATE (2.5 MG/3ML) 0.083% IN NEBU: 3.0000 mL | INHALATION_SOLUTION | Freq: Four times a day (QID) | RESPIRATORY_TRACT | Status: DC | PRN

## 2017-08-10 MED ORDER — METOPROLOL TARTRATE 50 MG PO TABS
50.0000 mg | ORAL_TABLET | Freq: Two times a day (BID) | ORAL | Status: DC
  Administered 2017-08-10 – 2017-08-11 (×2): 50 mg via ORAL
  Filled 2017-08-10 (×2): qty 1

## 2017-08-10 MED ORDER — ONDANSETRON HCL 4 MG/2ML IJ SOLN: 4.0000 mg | INTRAMUSCULAR | Status: DC | PRN

## 2017-08-10 MED ORDER — INFLUENZA VAC SPLIT QUAD 0.5 ML IM SUSY
0.5000 mL | PREFILLED_SYRINGE | INTRAMUSCULAR | Status: AC
Start: 2017-08-11 — End: 2017-08-11
  Administered 2017-08-11: 0.5 mL via INTRAMUSCULAR
  Filled 2017-08-10: qty 0.5

## 2017-08-10 MED ORDER — RANOLAZINE ER 500 MG PO TB12
1000.0000 mg | ORAL_TABLET | Freq: Two times a day (BID) | ORAL | Status: DC
Start: 2017-08-10 — End: 2017-08-11
  Administered 2017-08-10 – 2017-08-11 (×2): 1000 mg via ORAL
  Filled 2017-08-10 (×3): qty 2

## 2017-08-10 MED ORDER — NITROGLYCERIN 0.4 MG SL SUBL: 0.4000 mg | SUBLINGUAL_TABLET | SUBLINGUAL | Status: DC | PRN

## 2017-08-10 MED ORDER — INSULIN DEGLUDEC 200 UNIT/ML ~~LOC~~ SOPN: 60.0000 [IU] | PEN_INJECTOR | Freq: Two times a day (BID) | SUBCUTANEOUS | Status: DC

## 2017-08-10 MED ORDER — NITROGLYCERIN 0.4 MG SL SUBL
0.4000 mg | SUBLINGUAL_TABLET | SUBLINGUAL | Status: DC | PRN
  Administered 2017-08-10: 0.4 mg via SUBLINGUAL
  Filled 2017-08-10: qty 1

## 2017-08-10 MED ORDER — MELATONIN 5 MG PO TABS: 10.0000 mg | ORAL_TABLET | Freq: Every day | ORAL | Status: DC

## 2017-08-10 MED ORDER — CLOPIDOGREL BISULFATE 75 MG PO TABS
75.0000 mg | ORAL_TABLET | Freq: Every day | ORAL | Status: DC
  Administered 2017-08-11: 75 mg via ORAL
  Filled 2017-08-10: qty 1

## 2017-08-10 MED ORDER — MORPHINE SULFATE (PF) 2 MG/ML IV SOLN
1.0000 mg | INTRAVENOUS | Status: DC | PRN
  Administered 2017-08-10: 1 mg via INTRAVENOUS
  Filled 2017-08-10: qty 1

## 2017-08-10 MED FILL — Lidocaine HCl Local Inj 2%: INTRAMUSCULAR | Qty: 10 | Status: AC

## 2017-08-10 NOTE — Progress Notes (Signed)
Pt states Morphine makes him nauseated at times, MD on call paged and asked for Zofran. Waiting for orders/call back.

## 2017-08-10 NOTE — Progress Notes (Signed)
CRITICAL VALUE ALERT  Critical Value:  Troponin 0.03  Date & Time Notied:  08/10/17 @ 2217  Provider Notified: Dr Hal Hope  Orders Received/Actions taken: Waiting for orders/call back

## 2017-08-10 NOTE — ED Triage Notes (Addendum)
Heart cath yesterday .  Developed chest pain today around 1 am.  Same pain now that he was having before the heart cath.  Also c/o sob.  Took 4 baby aspirin at home  Per pt told by cardiologist that they were going to medically treat him.

## 2017-08-10 NOTE — Progress Notes (Signed)
Patient started reporting of little left sided chest pain again therefore he was given morphine in the meantime his troponins trended up slightly.  Patient seen and examined at the bedside. States his pain is getting better and doesn't have any other complaints.  His PE remains unremarkable. Vitals remain stable.   At this time due to concerns of possible NSTEMI in case if his Trop continue to rise I will order Lovenox 75m/Kg once and Nitro paste 1/2 inch. Patient agrees with the plan, wife at the bedisde.  Spoke with the nurse as well.   Please call with further questions if needed.

## 2017-08-10 NOTE — ED Notes (Signed)
Report given to [facility], pt going to room 301

## 2017-08-10 NOTE — H&P (Signed)
History and Physical    ZAVIYAR RAHAL WER:[id number] DOB: Jun 03, 1956 DOA: 08/10/2017  PCP: Deloria Lair., MD Patient coming from: Home  Chief Complaint: Chest pain  HPI: [name] [name] is a 61 y.o. male with medical history significant of CAD status post CABG, GERD, hypertension, asthma, arthritis, chronic low back pain, obstructive sleep apnea, hyperlipidemia, diabetes type 2 shape to the hospital with complaints of chest pain. Patient recently has been dealing with on and off unstable angina and ended up undergoing left heart catheterization yesterday by Dr. Saunders Revel. Results of cardiac catheterization as noted below. It was determined to manage his disease medically and was sent home. After going home patient did well yesterday but this morning started having some headache and mild nausea. Soon after while laying in his bed he started experiencing mild pressure-like left-sided chest pain radiating up to his left jaw. This lasted for several minutes and it resolved. This reoccurred after several minutes and has been on and off since. He called his cardiologist office who asked him to come to the ER for evaluation. He had his CABG back in 2017 and February and has been doing well otherwise. No recent medication changes. In the ER his initial labs were unremarkable including his first set of cardiac enzymes, EKG did not reveal any acute ST-T changes. Case was discussed by the ER physician with Dr. Johnsie Cancel from cardiology who recommended admitting the patient for observation and try negative enzymes. In the ER he was given nitroglycerin and Tylenol. He was also started on Isordil per cardiology recommendations.   LHC 08/09/2017 FINDINGS: 1. Severe native coronary artery disease, including moderate diffuse proximal/mid LAD disease, severe diffuse distal LAD disease, severe ostial stenosis of inferior branch of OM1 (supplied by free RIMA), and CTO of ostial rPDA. 2. Widely patent LIMA->LAD and  SVG->rPDA. Free RIMI->inferior branch of OM1 has 30-40% ostial disease. 3. Patent stents in OM1. 4. Mildly elevated LVEDP.    Review of Systems: As per HPI otherwise 10 point review of systems negative.   Past Medical History:  Diagnosis Date  . Anxiety   . Arthritis   . Asthma   . Cataract    Right eye  . Chronic lower back pain   . Coronary artery disease involving native coronary artery with unstable angina pectoris ([place])   . Coronary atherosclerosis of native coronary artery    a. LAD & OM stenting;  b. 2007 Cath: nonobs dzs, patent stents;  c. 07/2012 neg MV, EF 61%.  d. 11/20/14: [place] s/p  PCI w/ DES to pLAD and DES to OM1   . Essential hypertension, benign   . GERD (gastroesophageal reflux disease)   . Hearing loss of left ear   . History of gout   . History of hiatal hernia   . Hypercholesteremia   . Lumbar herniated disc   . MI, old   . Migraine   . OSA on CPAP   . 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 Thrombocytopenia ([place])   . TIA (transient ischemic attack) ~ 2010  . Type 2 diabetes mellitus ([place])     Past Surgical History:  Procedure Laterality Date  . ANTERIOR CERVICAL DECOMP/DISCECTOMY FUSION  1998   "C3-4"  . CARDIAC CATHETERIZATION  "several"  . CARDIAC CATHETERIZATION N/A 12/30/2015   Procedure: Left Heart Cath and Coronary Angiography;  Surgeon: [name], MD;  Location: [facility];  Service: Cardiovascular;  Laterality: N/A;  . CARPAL TUNNEL RELEASE Bilateral 2005  . COLONOSCOPY    . CORONARY ANGIOPLASTY    . CORONARY ANGIOPLASTY WITH STENT PLACEMENT  2002; 2003; 11/20/2014   "I have 4 stents after today" (11/20/2014)  . CORONARY ARTERY BYPASS GRAFT N/A 01/09/2016   Procedure: CORONARY ARTERY BYPASS GRAFTING (CABG) X 3 UTILIZING RIGHT AND LEFT INTERNAL MAMMARY ARTERY AND ENDOSCOPICALLY HARVESTED SAPHENEOUS VEIN.;  Surgeon: Rexene Alberts, MD;  Location: [facility];  Service: Open  Heart Surgery;  Laterality: N/A;  . ESOPHAGOGASTRODUODENOSCOPY    . KNEE SURGERY Left 02/2012   "scraped; open"  . LEFT HEART CATH AND CORS/GRAFTS ANGIOGRAPHY N/A 08/09/2017   Procedure: LEFT HEART CATH AND CORS/GRAFTS ANGIOGRAPHY;  Surgeon: [name], MD;  Location: [facility];  Service: Cardiovascular;  Laterality: N/A;  . LEFT HEART CATHETERIZATION WITH CORONARY ANGIOGRAM N/A 07/20/2012   Procedure: LEFT HEART CATHETERIZATION WITH CORONARY ANGIOGRAM;  Surgeon: [name], MD;  Location: [facility];  Service: Cardiovascular;  Laterality: N/A;  . LEFT HEART CATHETERIZATION WITH CORONARY ANGIOGRAM N/A 11/20/2014   Procedure: LEFT HEART CATHETERIZATION WITH CORONARY ANGIOGRAM;  Surgeon: [name], MD;  Location: [facility] CATH LAB;  Service: Cardiovascular;  Laterality: N/A;  . LEFT HEART CATHETERIZATION WITH CORONARY ANGIOGRAM N/A 11/26/2014   Procedure: LEFT HEART CATHETERIZATION WITH CORONARY ANGIOGRAM;  Surgeon: [name], MD;  Location: [facility] CATH LAB;  Service: Cardiovascular;  Laterality: N/A;  . NEUROPLASTY / TRANSPOSITION ULNAR NERVE AT ELBOW Right ~ 2012  . PERCUTANEOUS CORONARY ROTOBLATOR INTERVENTION (PCI-R)  11/20/2014   Procedure: PERCUTANEOUS CORONARY ROTOBLATOR INTERVENTION (PCI-R);  Surgeon: [name], MD;  Location: [facility] CATH LAB;  Service: Cardiovascular;;  . SHOULDER ARTHROSCOPY Left ~ 2011  . TEE WITHOUT CARDIOVERSION N/A 01/09/2016   Procedure: TRANSESOPHAGEAL ECHOCARDIOGRAM (TEE);  Surgeon: Rexene Alberts, MD;  Location: [place] [facility];  Service: Open Heart Surgery;  Laterality: N/A;     reports that he has never smoked. He has never used smokeless tobacco. He reports that he does not drink alcohol or use drugs.  Allergies  Allergen Reactions  . Imdur [Isosorbide Dinitrate] Other (See Comments)    Severe headache  . Statins Other (See Comments)    Muscle aches and cramps  . Tramadol Other (See Comments)    Chest pain   . Tricor [Fenofibrate] Other (See  Comments)    Leg cramps  . Valproic Acid Other (See Comments)    "anger"    Family History  Problem Relation Age of Onset  . Coronary artery disease Father 31    Acceptable: Family history reviewed and not pertinent (If you reviewed it)  Prior to Admission medications   Medication Sig Start Date End Date Taking? Authorizing Provider  acetaminophen (TYLENOL) 500 MG tablet Take 1,000 mg by mouth every 6 (six) hours as needed (pain).   Yes [provider]  albuterol (PROVENTIL HFA;VENTOLIN HFA) 108 (90 BASE) MCG/ACT inhaler Inhale 2 puffs into the lungs every 6 (six) hours as needed for wheezing or shortness of breath.   Yes [provider]  albuterol (PROVENTIL) (2.5 MG/3ML) 0.083% nebulizer solution Take 2.5 mg by nebulization every 6 (six) hours as needed for wheezing or shortness of breath.   Yes [provider]  aspirin EC 81 MG tablet Take 81 mg by mouth at bedtime.    Yes [provider]  citalopram (CELEXA) 20 MG tablet Take 20 mg by mouth  at bedtime.    Yes [provider]  clopidogrel (PLAVIX) 75 MG tablet Take 1 tablet (75 mg total) by mouth daily. 01/14/16  Yes Tacy Dura, Donielle M, PA-C  fluticasone (FLONASE) 50 MCG/ACT nasal spray Place 1 spray into the nose 2 (two) times daily.    Yes [provider]  Fluticasone-Salmeterol (ADVAIR) 250-50 MCG/DOSE AEPB Inhale 1 puff into the lungs every 12 (twelve) hours.   Yes [provider]  gabapentin (NEURONTIN) 300 MG capsule Take 300 mg by mouth 3 (three) times daily. 07/09/17  Yes [provider]  insulin aspart (NOVOLOG FLEXPEN) 100 UNIT/ML FlexPen Inject 1-15 Units into the skin 3 (three) times daily as needed for high blood sugar (CBG >120). Per sliding scale written down at home   Yes [provider]  Insulin Degludec (TRESIBA FLEXTOUCH) 200 UNIT/ML SOPN Inject 60-70 Units into the skin 2 (two) times daily. 70 units in the morning and 60 units at bedtime    Yes [provider]  Melatonin 5 MG TABS Take 10 mg by mouth at bedtime.    Yes [provider]  metFORMIN (GLUCOPHAGE) 1000 MG tablet Take 1 tablet (1,000 mg total) by mouth 2 (two) times daily. 08/12/17  Yes End, Harrell Gave, MD  metoprolol tartrate (LOPRESSOR) 50 MG tablet Take 50 mg by mouth 2 (two) times daily. 04/13/17  Yes [provider]  omeprazole (PRILOSEC) 20 MG capsule Take 20 mg by mouth 2 (two) times daily before a meal.   Yes [provider]  pravastatin (PRAVACHOL) 20 MG tablet Take 20 mg by mouth daily.   Yes [provider]  ramipril (ALTACE) 5 MG capsule Take 1 capsule (5 mg total) by mouth daily. 08/05/17 11/03/17 Yes [name], MD  RANEXA 500 MG 12 hr tablet Take 1,000 mg by mouth 2 (two) times daily. 04/13/17  Yes [provider]  topiramate (TOPAMAX) 25 MG capsule Take 25 mg by mouth 2 (two) times daily.   Yes [provider]    Physical Exam: Vitals:   08/10/17 1500 08/10/17 1548 08/10/17 1701 08/10/17 1807  BP: 123/83  135/83 135/84  Pulse: 63  67 63  Resp: 11  18 18   Temp:  98.2 F (36.8 C)    TempSrc:  Oral    SpO2: 100%  99% 98%  Weight:      Height:          Constitutional: NAD, calm, comfortable Vitals:   08/10/17 1500 08/10/17 1548 08/10/17 1701 08/10/17 1807  BP: 123/83  135/83 135/84  Pulse: 63  67 63  Resp: 11  18 18   Temp:  98.2 F (36.8 C)    TempSrc:  Oral    SpO2: 100%  99% 98%  Weight:      Height:       Eyes: PERRL, lids and conjunctivae normal ENMT: Mucous membranes are moist. Posterior pharynx clear of any exudate or lesions.Normal dentition.  Neck: normal, supple, no masses, no thyromegaly Respiratory: clear to auscultation bilaterally, no wheezing, no crackles. Normal respiratory effort. No accessory muscle use.  Cardiovascular: CABG scar noted, Regular rate and rhythm, no murmurs / rubs / gallops. No extremity edema. 2+ pedal pulses. No carotid bruits.  Abdomen:  no tenderness, no masses palpated. No hepatosplenomegaly. Bowel sounds positive.  Musculoskeletal: no clubbing / cyanosis. No joint deformity upper and lower extremities. Good ROM, no contractures. Normal muscle tone.  Skin: no rashes, lesions, ulcers. No induration Neurologic: CN 2-12 grossly intact. Sensation intact,  DTR normal. Strength 5/5 in all 4.  Psychiatric: Normal judgment and insight. Alert and oriented x 3. Normal mood.     Labs on Admission: I have personally reviewed following labs and imaging studies  CBC:  Recent Labs Lab 08/10/17 1440  WBC 3.0*  HGB 14.9  HCT 41.9  MCV 90.3  PLT 536*   Basic Metabolic Panel:  Recent Labs Lab 08/10/17 1440  NA 137  K 4.4  CL 105  CO2 25  GLUCOSE 204*  BUN 14  CREATININE 1.08  CALCIUM 9.1   GFR: Estimated Creatinine Clearance: 89 mL/min (by C-G formula based on SCr of 1.08 mg/dL). Liver Function Tests: No results for input(s): AST, ALT, ALKPHOS, BILITOT, PROT, ALBUMIN in the last 168 hours. No results for input(s): LIPASE, AMYLASE in the last 168 hours. No results for input(s): AMMONIA in the last 168 hours. Coagulation Profile:  Recent Labs Lab 08/09/17 1330  INR 1.11   Cardiac Enzymes:  Recent Labs Lab 08/10/17 1440  TROPONINI <0.03   BNP (last 3 results) No results for input(s): PROBNP in the last 8760 hours. HbA1C: No results for input(s): HGBA1C in the last 72 hours. CBG:  Recent Labs Lab 08/09/17 1118 08/09/17 1511  GLUCAP 149* 104*   Lipid Profile: No results for input(s): CHOL, HDL, LDLCALC, TRIG, CHOLHDL, LDLDIRECT in the last 72 hours. Thyroid Function Tests: No results for input(s): TSH, T4TOTAL, FREET4, T3FREE, THYROIDAB in the last 72 hours. Anemia Panel: No results for input(s): VITAMINB12, FOLATE, FERRITIN, TIBC, IRON, RETICCTPCT in the last 72 hours. Urine analysis:    Component Value Date/Time   COLORURINE YELLOW 01/07/2016 1339   APPEARANCEUR CLEAR 01/07/2016 1339   LABSPEC  1.019 01/07/2016 1339   PHURINE 5.0 01/07/2016 1339   GLUCOSEU NEGATIVE 01/07/2016 1339   HGBUR NEGATIVE 01/07/2016 1339   BILIRUBINUR NEGATIVE 01/07/2016 1339   KETONESUR NEGATIVE 01/07/2016 1339   PROTEINUR NEGATIVE 01/07/2016 1339   UROBILINOGEN 0.2 12/04/2013 0730   NITRITE NEGATIVE 01/07/2016 1339   LEUKOCYTESUR NEGATIVE 01/07/2016 1339   Sepsis Labs: !!!!!!!!!!!!!!!!!!!!!!!!!!!!!!!!!!!!!!!!!!!! @LABRCNTIP (procalcitonin:4,lacticidven:4) )No results found for this or any previous visit (from the past 240 hour(s)).   Radiological Exams on Admission: Dg Chest Port 1 View  Result Date: 08/10/2017 CLINICAL DATA:  Cardiac catheterization yesterday. Developed chest pain at 1 a.m. today. Also shortness of breath. History of previous CABG as well as coronary stent placement. EXAM: PORTABLE CHEST 1 VIEW COMPARISON:  Chest x-ray of August 03, 2017 FINDINGS: The lungs are adequately inflated and clear. The heart and pulmonary vascularity are normal. The mediastinum is normal in width. There is calcification in the wall of the aortic arch. The sternal wires appear intact. The bony thorax exhibits no acute abnormality. IMPRESSION: There is no acute cardiopulmonary abnormality. Thoracic aortic atherosclerosis. Electronically Signed   By: David  [name] M.D.   On: 08/10/2017 16:25    EKG: Independently reviewed. No acute ST-T changes  Assessment/Plan Active Problems:   Chest pain   Chest pain-likely unstable angina CAD status post CABG in 2017 -Admit for observation -Trend cardiac enzymes, check TSH, A1c and lipid panel -Nitroglycerin as needed, will also order morphine if necessary -Consult cardiology in a.m. in case if he needs any further medication adjustments next line-supportive care -Left heart catheterization results from yesterday noted -EKG does not show any acute ST-T changes -Continue aspirin, Plavix, metoprolol, ramipril, Ranexa -Supportive care, supplemental oxygen if  needed  History of diabetes type 2 -Accu-Chek and sliding scale -Hold metformin while hospitalized otherwise  continue home insulin  History of asthma -Albuterol as needed, home bronchodilators  Peripheral neuropathy -Continue gabapentin 300 mg 3 times daily  GERD -Omeprazole 20 mg twice daily  Hyperlipidemia -Continue statin  History of depression -Continue Celexa   On topiramate-secondary to migraines?   DVT prophylaxis: SCDs Code Status: Full code Family Communication: Wife at bedside Disposition Plan: Admit for 24-48 hours Consults called: Cardiology consulted at [facility] and advised to admit for observation. If necessary consult cardiology tomorrow a.m. For further medical adjustment Admission status: Observation telemetry   Hashim Eichhorst Arsenio Loader MD [facility]   If 7PM-7AM, please contact night-coverage [email or web address] Password [email or web address]  08/10/2017, 6:22 PM

## 2017-08-10 NOTE — Telephone Encounter (Signed)
Pt c/o chest pain rated at a 6 with SOB and lightheadedness starting around 30 minutes ago. Says pain is constant- did not know BP/HR - pt was discharged from [facility] yesterday [facility] and was doing fine last night and this morning. Advised pt safest action would be evaluation at [facility] ED or [facility] with symptoms. Pt had wife with him and she would drive him. Pt declined ambulance says they reside in [place], [place] and did not want to be taken to [facility] or [place] hospital. Routed to provider FYI

## 2017-08-10 NOTE — ED Notes (Signed)
Pt states she needs a note saying patient is being admitted so that she can fax it to her job. She is taking a Leave of Absence.

## 2017-08-10 NOTE — ED Provider Notes (Signed)
[facility] DEPT Provider Note   CSN: [id number] Arrival date & time: 08/10/17  1356     History   Chief Complaint Chief Complaint  Patient presents with  . Chest Pain    HPI [name] [name] is a 61 y.o. male.  HPI Complaining of left anterior chest pain nonradiating onset 2 hours prior to coming here. Treated himself with 4 baby aspirin's and Plavix, without relief. He did not take any sublingual nitroglycerin tablets as he has run out.she had cardiac catheterization performed yesterdayshowing diffuse coronary artery disease.associated symptoms include nausea and dizziness. No other associated symptoms. Past Medical History:  Diagnosis Date  . Anxiety   . Arthritis   . Asthma   . Cataract    Right eye  . Chronic lower back pain   . 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 Coronary atherosclerosis of native coronary artery    a. LAD & OM stenting;  b. 2007 Cath: nonobs dzs, patent stents;  c. 07/2012 neg MV, EF 61%.  d. 11/20/14: [place] s/p  PCI w/ DES to pLAD and DES to OM1   . Essential hypertension, benign   . GERD (gastroesophageal reflux disease)   . Hearing loss of left ear   . History of gout   . History of hiatal hernia   . Hypercholesteremia   . Lumbar herniated disc   . MI, old   . Migraine   . OSA on CPAP   . 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 Thrombocytopenia ([place])   . TIA (transient ischemic attack) ~ 2010  . Type 2 diabetes mellitus [facility])     Patient Active Problem List   Diagnosis Date Noted  . 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 CAD -S/P PCI LAD OM/LAD '13 and 2016 12/31/2015  . Hyperlipidemia   . Chest pain with high risk of acute coronary syndrome 12/23/2015  . Precordial chest pain 12/03/2014  . TIA (transient ischemic attack)   .  CAD- diffuse LAD disease at cath 12/30/15   . Thrombocytopenia ([place])   . Accelerating angina ([place]) 12/03/2013  . Type 2 diabetes mellitus ([place])   . Anxiety   . PVC's (premature ventricular contractions) 01/06/2013  . Obstructive sleep apnea-declines C-pap 07/04/2012  . Mixed hyperlipidemia 07/30/2009  . Essential hypertension, benign 07/30/2009    Past Surgical History:  Procedure Laterality Date  . ANTERIOR CERVICAL DECOMP/DISCECTOMY FUSION  1998   "C3-4"  . CARDIAC CATHETERIZATION  "several"  . CARDIAC CATHETERIZATION N/A 12/30/2015   Procedure: Left Heart Cath and Coronary Angiography;  Surgeon: [name], MD;  Location: [place] CV LAB;  Service: Cardiovascular;  Laterality: N/A;  . CARPAL TUNNEL RELEASE Bilateral 2005  . COLONOSCOPY    . CORONARY ANGIOPLASTY    . CORONARY ANGIOPLASTY WITH STENT PLACEMENT  2002; 2003; 11/20/2014   "I have 4 stents after today" (11/20/2014)  . CORONARY ARTERY BYPASS GRAFT N/A 01/09/2016   Procedure: CORONARY ARTERY BYPASS GRAFTING (CABG) X 3 UTILIZING RIGHT AND LEFT INTERNAL MAMMARY ARTERY AND ENDOSCOPICALLY HARVESTED SAPHENEOUS VEIN.;  Surgeon: Rexene Alberts, MD;  Location: [facility];  Service: Open Heart Surgery;  Laterality: N/A;  . ESOPHAGOGASTRODUODENOSCOPY    . KNEE SURGERY Left 02/2012   "scraped; open"  . LEFT  HEART CATH AND CORS/GRAFTS ANGIOGRAPHY N/A 08/09/2017   Procedure: LEFT HEART CATH AND CORS/GRAFTS ANGIOGRAPHY;  Surgeon: [name], MD;  Location: [place] CV LAB;  Service: Cardiovascular;  Laterality: N/A;  . LEFT HEART CATHETERIZATION WITH CORONARY ANGIOGRAM N/A 07/20/2012   Procedure: LEFT HEART CATHETERIZATION WITH CORONARY ANGIOGRAM;  Surgeon: [name], MD;  Location: [place] CATH LAB;  Service: Cardiovascular;  Laterality: N/A;  . LEFT HEART CATHETERIZATION WITH CORONARY ANGIOGRAM N/A 11/20/2014   Procedure: LEFT HEART CATHETERIZATION WITH CORONARY ANGIOGRAM;  Surgeon: [name], MD;  Location: [facility] CATH LAB;   Service: Cardiovascular;  Laterality: N/A;  . LEFT HEART CATHETERIZATION WITH CORONARY ANGIOGRAM N/A 11/26/2014   Procedure: LEFT HEART CATHETERIZATION WITH CORONARY ANGIOGRAM;  Surgeon: [name], MD;  Location: [facility] CATH LAB;  Service: Cardiovascular;  Laterality: N/A;  . NEUROPLASTY / TRANSPOSITION ULNAR NERVE AT ELBOW Right ~ 2012  . PERCUTANEOUS CORONARY ROTOBLATOR INTERVENTION (PCI-R)  11/20/2014   Procedure: PERCUTANEOUS CORONARY ROTOBLATOR INTERVENTION (PCI-R);  Surgeon: [name], MD;  Location: [facility] CATH LAB;  Service: Cardiovascular;;  . SHOULDER ARTHROSCOPY Left ~ 2011  . TEE WITHOUT CARDIOVERSION N/A 01/09/2016   Procedure: TRANSESOPHAGEAL ECHOCARDIOGRAM (TEE);  Surgeon: Rexene Alberts, MD;  Location: [place];  Service: Open Heart Surgery;  Laterality: N/A;       Home Medications    Prior to Admission medications   Medication Sig Start Date End Date Taking? Authorizing Provider  acetaminophen (TYLENOL) 500 MG tablet Take 1,000 mg by mouth every 6 (six) hours as needed (pain).   Yes [provider]  albuterol (PROVENTIL HFA;VENTOLIN HFA) 108 (90 BASE) MCG/ACT inhaler Inhale 2 puffs into the lungs every 6 (six) hours as needed for wheezing or shortness of breath.   Yes [provider]  albuterol (PROVENTIL) (2.5 MG/3ML) 0.083% nebulizer solution Take 2.5 mg by nebulization every 6 (six) hours as needed for wheezing or shortness of breath.   Yes [provider]  aspirin EC 81 MG tablet Take 81 mg by mouth at bedtime.    Yes [provider]  citalopram (CELEXA) 20 MG tablet Take 20 mg by mouth at bedtime.    Yes [provider]  clopidogrel (PLAVIX) 75 MG tablet Take 1 tablet (75 mg total) by mouth daily. 01/14/16  Yes Tacy Dura, Donielle M, PA-C  fluticasone (FLONASE) 50 MCG/ACT nasal spray Place 1 spray into the nose 2 (two) times daily.    Yes [provider]  Fluticasone-Salmeterol (ADVAIR) 250-50 MCG/DOSE AEPB Inhale 1 puff  into the lungs every 12 (twelve) hours.   Yes [provider]  gabapentin (NEURONTIN) 300 MG capsule Take 300 mg by mouth 3 (three) times daily. 07/09/17  Yes [provider]  insulin aspart (NOVOLOG FLEXPEN) 100 UNIT/ML FlexPen Inject 1-15 Units into the skin 3 (three) times daily as needed for high blood sugar (CBG >120). Per sliding scale written down at home   Yes [provider]  Insulin Degludec (TRESIBA FLEXTOUCH) 200 UNIT/ML SOPN Inject 60-70 Units into the skin 2 (two) times daily. 70 units in the morning and 60 units at bedtime   Yes [provider]  Melatonin 5 MG TABS Take 10 mg by mouth at bedtime.    Yes [provider]  metFORMIN (GLUCOPHAGE) 1000 MG tablet Take 1 tablet (1,000 mg total) by mouth 2 (two) times daily. 08/12/17  Yes End, Harrell Gave, MD  metoprolol tartrate (LOPRESSOR) 50 MG tablet Take 50 mg by mouth 2 (two) times daily.  04/13/17  Yes [provider]  omeprazole (PRILOSEC) 20 MG capsule Take 20 mg by mouth 2 (two) times daily before a meal.   Yes [provider]  pravastatin (PRAVACHOL) 20 MG tablet Take 20 mg by mouth daily.   Yes [provider]  ramipril (ALTACE) 5 MG capsule Take 1 capsule (5 mg total) by mouth daily. 08/05/17 11/03/17 Yes [name], MD  RANEXA 500 MG 12 hr tablet Take 1,000 mg by mouth 2 (two) times daily. 04/13/17  Yes [provider]  topiramate (TOPAMAX) 25 MG capsule Take 25 mg by mouth 2 (two) times daily.   Yes [provider]    Family History Family History  Problem Relation Age of Onset  . Coronary artery disease Father 30    Social History Social History  Substance Use Topics  . Smoking status: Never Smoker  . Smokeless tobacco: Never Used  . Alcohol use No     Allergies   Imdur [isosorbide dinitrate]; Statins; Tramadol; Tricor [fenofibrate]; and Valproic acid   Review of Systems Review of Systems  Constitutional: Negative.     HENT: Negative.   Respiratory: Negative.   Cardiovascular: Positive for chest pain.  Gastrointestinal: Positive for nausea.  Musculoskeletal: Negative.   Skin: Negative.   Allergic/Immunologic: Positive for immunocompromised state.       Diabetic  Neurological: Positive for dizziness.  Psychiatric/Behavioral: Negative.   All other systems reviewed and are negative.    Physical Exam Updated Vital Signs BP 123/83   Pulse 63   Resp 11   Ht 5' 9"  (1.753 m)   Wt 107.5 kg (237 lb)   SpO2 100%   BMI 35.00 kg/m   Physical Exam  Constitutional: He appears well-developed and well-nourished.  HENT:  Head: Normocephalic and atraumatic.  Eyes: Pupils are equal, round, and reactive to light. Conjunctivae are normal.  Neck: Neck supple. No tracheal deviation present. No thyromegaly present.  Cardiovascular: Normal rate and regular rhythm.   No murmur heard. Pulmonary/Chest: Effort normal and breath sounds normal.  Abdominal: Soft. Bowel sounds are normal. He exhibits no distension. There is no tenderness.  Musculoskeletal: Normal range of motion. He exhibits no edema or tenderness.  Neurological: He is alert. Coordination normal.  Skin: Skin is warm and dry. No rash noted.  Psychiatric: He has a normal mood and affect.  Nursing note and vitals reviewed.    ED Treatments / Results  Labs (all labs ordered are listed, but only abnormal results are displayed) Labs Reviewed  BASIC METABOLIC PANEL - Abnormal; Notable for the following:       Result Value   Glucose, Bld 204 (*)    All other components within normal limits  CBC - Abnormal; Notable for the following:    WBC 3.0 (*)    Platelets 102 (*)    All other components within normal limits  TROPONIN I    EKG  EKG Interpretation  Date/Time:  Tuesday August 10 2017 14:03:17 EDT Ventricular Rate:  67 PR Interval:  136 QRS Duration: 86 QT Interval:  420 QTC Calculation: 443 R Axis:   72 Text Interpretation:  Normal  sinus rhythm Normal ECG No significant change since last tracing Confirmed by Orlie Dakin [id number]) on 08/10/2017 2:49:35 PM       Radiology No results found.  Procedures Procedures (including critical care time)  Medications Ordered in ED Medications  nitroGLYCERIN (NITROSTAT) SL tablet 0.4 mg (0.4 mg Sublingual Given 08/10/17 1538)  isosorbide dinitrate (ISORDIL)  tablet 10 mg (not administered)  chestx-ray viewed by me  Results for orders placed or performed during the hospital encounter of 54/09/81  Basic metabolic panel  Result Value Ref Range   Sodium 137 135 - 145 mmol/L   Potassium 4.4 3.5 - 5.1 mmol/L   Chloride 105 101 - 111 mmol/L   CO2 25 22 - 32 mmol/L   Glucose, Bld 204 (H) 65 - 99 mg/dL   BUN 14 6 - 20 mg/dL   Creatinine, Ser 1.08 0.61 - 1.24 mg/dL   Calcium 9.1 8.9 - 10.3 mg/dL   GFR calc non Af Amer >60 >60 mL/min   GFR calc Af Amer >60 >60 mL/min   Anion gap 7 5 - 15  CBC  Result Value Ref Range   WBC 3.0 (L) 4.0 - 10.5 K/uL   RBC 4.64 4.22 - 5.81 MIL/uL   Hemoglobin 14.9 13.0 - 17.0 g/dL   HCT 41.9 39.0 - 52.0 %   MCV 90.3 78.0 - 100.0 fL   MCH 32.1 26.0 - 34.0 pg   MCHC 35.6 30.0 - 36.0 g/dL   RDW 12.9 11.5 - 15.5 %   Platelets 102 (L) 150 - 400 K/uL  Troponin I  Result Value Ref Range   Troponin I <0.03 <0.03 ng/mL   Dg Chest Port 1 View  Result Date: 08/10/2017 CLINICAL DATA:  Cardiac catheterization yesterday. Developed chest pain at 1 a.m. today. Also shortness of breath. History of previous CABG as well as coronary stent placement. EXAM: PORTABLE CHEST 1 VIEW COMPARISON:  Chest x-ray of August 03, 2017 FINDINGS: The lungs are adequately inflated and clear. The heart and pulmonary vascularity are normal. The mediastinum is normal in width. There is calcification in the wall of the aortic arch. The sternal wires appear intact. The bony thorax exhibits no acute abnormality. IMPRESSION: There is no acute cardiopulmonary abnormality. Thoracic  aortic atherosclerosis. Electronically Signed   By: David  [name] M.D.   On: 08/10/2017 16:25  chest x-ray viewed by me 3:40 P.m. Patient is asymptomatic and pain-freeafter treatment with one sublingual nitroglycerin. I spoke with Dr.Nishan from cardiology service via telephone who feels the patient can stay hererather than be [facility] [place]. He should be observed overnight., serial cardiac isoenzymesHe can be started on Isordil 10 mg twice daily  Chest x-ray viewed by me Initial Impression / Assessment and Plan / ED Course  I have reviewed the triage vital signs and the nursing notes.  Pertinent labs & imaging results that were available during my care of the patient were reviewed by me and considered in my medical decision making (see chart for details).   4:25 PM patient remains chest pain-free after treatment with nitrates. He complains of headache since treatment with nitrates. Tylenol ordered.  hospitalistservice consulted who will arrange for overnight stay  Final Clinical Impressions(s) / ED Diagnoses  Diagnosis #1 angina pectoris #2 hyperglycemia Final diagnoses:  None    New Prescriptions New Prescriptions   No medications on file     Orlie Dakin, MD 08/10/17 1658

## 2017-08-10 NOTE — Progress Notes (Signed)
After making Dr. Hal Hope aware of pts Troponin of 0.03, He stated that he is going to get Cardiology to come see him tonight. Will make pt aware.

## 2017-08-11 ENCOUNTER — Telehealth: Payer: Self-pay | Admitting: *Deleted

## 2017-08-11 DIAGNOSIS — R079 Chest pain, unspecified: Secondary | ICD-10-CM

## 2017-08-11 DIAGNOSIS — I209 Angina pectoris, unspecified: Secondary | ICD-10-CM | POA: Diagnosis not present

## 2017-08-11 LAB — GLUCOSE, CAPILLARY
Glucose-Capillary: 101 mg/dL — ABNORMAL HIGH (ref 65–99)
Glucose-Capillary: 115 mg/dL — ABNORMAL HIGH (ref 65–99)

## 2017-08-11 LAB — TROPONIN I

## 2017-08-11 LAB — COMPREHENSIVE METABOLIC PANEL
ALK PHOS: 35 U/L — AB (ref 38–126)
ALT: 27 U/L (ref 17–63)
AST: 24 U/L (ref 15–41)
Albumin: 3.5 g/dL (ref 3.5–5.0)
Anion gap: 7 (ref 5–15)
BUN: 16 mg/dL (ref 6–20)
CALCIUM: 8.5 mg/dL — AB (ref 8.9–10.3)
CO2: 24 mmol/L (ref 22–32)
CREATININE: 1 mg/dL (ref 0.61–1.24)
Chloride: 105 mmol/L (ref 101–111)
GFR calc non Af Amer: >60 mL/min (ref >60)
Glucose, Bld: 131 mg/dL — ABNORMAL HIGH (ref 65–99)
Potassium: 3.5 mmol/L (ref 3.5–5.1)
SODIUM: 136 mmol/L (ref 135–145)
Total Bilirubin: 0.5 mg/dL (ref 0.3–1.2)
Total Protein: 6.6 g/dL (ref 6.5–8.1)

## 2017-08-11 LAB — CBC
HCT: 37.8 % — ABNORMAL LOW (ref 39.0–52.0)
Hemoglobin: 13.6 g/dL (ref 13.0–17.0)
MCH: 32.5 pg (ref 26.0–34.0)
MCHC: 36 g/dL (ref 30.0–36.0)
MCV: 90.2 fL (ref 78.0–100.0)
PLATELETS: 93 10*3/uL — AB (ref 150–400)
RBC: 4.19 MIL/uL — AB (ref 4.22–5.81)
RDW: 12.8 % (ref 11.5–15.5)
WBC: 3.4 10*3/uL — ABNORMAL LOW (ref 4.0–10.5)

## 2017-08-11 NOTE — Telephone Encounter (Signed)
Wife [name] [name]) calling with concerns about husband.  Stated they have been back & forth to the hospital with chest pain since his heart cath.  Each time states they are told nothing else can be done & sends him home anyway.  Stated that he continues to have complaints of this chest pain & does not know what to do.  Would like to have some closure on this issue as she is scared of losing her job with having to be out with him so much recently.  I have given her follow up with Dr. Domenic Polite here in [place] for Friday, 08/13/2017 at 11:00.  [facility] did not have anything available in our [place] office for tomorrow.  She questions what she is supposed to do till then, just keep him home & keep giving him Nitroglycerin.  Explained to her that if he has active chest pain that is not relieved by the NTG (reviewd 3 in 15 minute time frame) then he should go back to the ED in the meantime.  Another option she may have is for him to see his PMD tomorrow to rule out any other noncardiac issues.  Message sent to provider for further advice.

## 2017-08-11 NOTE — Progress Notes (Signed)
Patient being d/c home with wife. IV cath removed and intact. Verbalizes understanding. No c/o pain at this time or at site.

## 2017-08-11 NOTE — Progress Notes (Signed)
Cardiology Office Note  Date: 08/13/2017   ID: [name], [name] 1956-08-17, MRN [id number]  PCP: Deloria Lair., MD  Primary Cardiologist: Rozann Lesches, MD   Chief Complaint  Patient presents with  . Coronary Artery Disease    History of Present Illness: [name] [name] is a 61 y.o. male seen recently on September 27, reporting accelerating angina symptoms at that time on medical therapy, and referred for a follow-up cardiac catheterization. Procedure was performed on October 1 by Dr. Saunders Revel with finding of patent bypass grafts including LIMA to LAD, SVG to PDA, and free radial to OM1 with only 40% ostial stenosis noted. He did have progressive distal LAD disease and mildly elevated left ventricular filling pressures, with recommendation for medical therapy and no specific revascularization options defined. He has continued to have chest pain and has been seen in the ER in the interim. Troponin I levels have not significant and ECG shows no acute ST segment changes. In fact, just yesterday he was seen in consultation by Dr. Harl Bowie at an ER visit, taken off lisinopril given reported orthostasis and low blood pressures and otherwise continued on the antianginal regimen outlined below. He has not tolerated nitroglycerin patch and stopped it.  He presents to the office today with his wife. He still has daily episodes of chest pain, describes a sharp sensation and then lingering feeling that can last for minutes to hours. Not entirely clear that nitroglycerin is helpful when he takes it. Also does feel lightheaded sometimes when he stands up but has had no palpitations or syncope. He is not reporting any fevers or chills, no cough or hemoptysis, no wheezing. He does have reflux symptoms on PPI. Also reports a history of asthma but does not use nebulizers or MDIs regularly.  Today we discussed the results of his cardiac catheterization in detail, which overall was fairly reassuring. I had Dr.  Burt Knack review the patient's angiography as well and he agreed that the best course of action is medical therapy. Although the distal LAD could certainly be giving him some angina symptoms, it seems unlikely to me that this is causing daily rest chest pain, particularly on the present medical regimen. I did discuss with them consideration that his recurring chest pain could be noncardiac that we should at least consider a chest CTA to further evaluate the thoracic aorta and lungs. Otherwise he may need to consider GI evaluation with follow-up per PCP.  His present cardiac regimen includes aspirin, Norvasc, Plavix, Ranexa, Lopressor, and Pravachol. He has stopped the nitroglycerin paste and has a previous intolerance to Imdur. He does have sublingual nitroglycerin available.  Past Medical History:  Diagnosis Date  . Anxiety   . Arthritis   . Asthma   . Cataract    Right eye  . Chronic lower back pain   . Coronary atherosclerosis of native coronary artery    a. LAD & OM stenting;  b. 2007 Cath: nonobs dzs, patent stents;  c. 07/2012 neg MV, EF 61%.  d. 11/20/14: [place] s/p  PCI w/ DES to pLAD and DES to OM1   . Essential hypertension, benign   . GERD (gastroesophageal reflux disease)   . Hearing loss of left ear   . History of gout   . History of hiatal hernia   . Hypercholesteremia   . Lumbar herniated disc   . MI, old   . Migraine   . OSA on CPAP   . 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 Thrombocytopenia ([place])   . TIA (transient ischemic attack) ~ 2010  . Type 2 diabetes mellitus ([place])     Past Surgical History:  Procedure Laterality Date  . ANTERIOR CERVICAL DECOMP/DISCECTOMY FUSION  1998   "C3-4"  . CARDIAC CATHETERIZATION  "several"  . CARDIAC CATHETERIZATION N/A 12/30/2015   Procedure: Left Heart Cath and Coronary Angiography;  Surgeon: [name], MD;  Location: [facility];  Service: Cardiovascular;   Laterality: N/A;  . CARPAL TUNNEL RELEASE Bilateral 2005  . COLONOSCOPY    . CORONARY ANGIOPLASTY    . CORONARY ANGIOPLASTY WITH STENT PLACEMENT  2002; 2003; 11/20/2014   "I have 4 stents after today" (11/20/2014)  . CORONARY ARTERY BYPASS GRAFT N/A 01/09/2016   Procedure: CORONARY ARTERY BYPASS GRAFTING (CABG) X 3 UTILIZING RIGHT AND LEFT INTERNAL MAMMARY ARTERY AND ENDOSCOPICALLY HARVESTED SAPHENEOUS VEIN.;  Surgeon: Rexene Alberts, MD;  Location: [facility];  Service: Open Heart Surgery;  Laterality: N/A;  . ESOPHAGOGASTRODUODENOSCOPY    . KNEE SURGERY Left 02/2012   "scraped; open"  . LEFT HEART CATH AND CORS/GRAFTS ANGIOGRAPHY N/A 08/09/2017   Procedure: LEFT HEART CATH AND CORS/GRAFTS ANGIOGRAPHY;  Surgeon: [name], MD;  Location: [facility];  Service: Cardiovascular;  Laterality: N/A;  . LEFT HEART CATHETERIZATION WITH CORONARY ANGIOGRAM N/A 07/20/2012   Procedure: LEFT HEART CATHETERIZATION WITH CORONARY ANGIOGRAM;  Surgeon: [name], MD;  Location: [facility];  Service: Cardiovascular;  Laterality: N/A;  . LEFT HEART CATHETERIZATION WITH CORONARY ANGIOGRAM N/A 11/20/2014   Procedure: LEFT HEART CATHETERIZATION WITH CORONARY ANGIOGRAM;  Surgeon: [name], MD;  Location: [facility];  Service: Cardiovascular;  Laterality: N/A;  . LEFT HEART CATHETERIZATION WITH CORONARY ANGIOGRAM N/A 11/26/2014   Procedure: LEFT HEART CATHETERIZATION WITH CORONARY ANGIOGRAM;  Surgeon: [name], MD;  Location: [facility] CATH LAB;  Service: Cardiovascular;  Laterality: N/A;  . NEUROPLASTY / TRANSPOSITION ULNAR NERVE AT ELBOW Right ~ 2012  . PERCUTANEOUS CORONARY ROTOBLATOR INTERVENTION (PCI-R)  11/20/2014   Procedure: PERCUTANEOUS CORONARY ROTOBLATOR INTERVENTION (PCI-R);  Surgeon: [name], MD;  Location: [facility];  Service: Cardiovascular;;  . SHOULDER ARTHROSCOPY Left ~ 2011  . TEE WITHOUT CARDIOVERSION N/A 01/09/2016   Procedure: TRANSESOPHAGEAL ECHOCARDIOGRAM (TEE);  Surgeon:  Rexene Alberts, MD;  Location: [facility] [place];  Service: Open Heart Surgery;  Laterality: N/A;    Current Outpatient Prescriptions  Medication Sig Dispense Refill  . acetaminophen (TYLENOL) 500 MG tablet Take 1,000 mg by mouth every 6 (six) hours as needed (pain).    [name] albuterol (PROVENTIL HFA;VENTOLIN HFA) 108 (90 BASE) MCG/ACT inhaler Inhale 2 puffs into the lungs every 6 (six) hours as needed for wheezing or shortness of breath.    [name] albuterol (PROVENTIL) (2.5 MG/3ML) 0.083% nebulizer solution Take 2.5 mg by nebulization every 6 (six) hours as needed for wheezing or shortness of breath.    [name] amLODipine (NORVASC) 2.5 MG tablet Take 1 tablet (2.5 mg total) by mouth daily. 30 tablet 0  . aspirin EC 81 MG tablet Take 81 mg by mouth at bedtime.     . citalopram (CELEXA) 20 MG tablet Take 20 mg by mouth at bedtime.     . clopidogrel (PLAVIX) 75 MG tablet Take 1 tablet (75 mg total) by mouth daily. 30 tablet 1  . fluticasone (FLONASE) 50 MCG/ACT nasal spray Place  1 spray into the nose 2 (two) times daily.     . Fluticasone-Salmeterol (ADVAIR) 250-50 MCG/DOSE AEPB Inhale 1 puff into the lungs every 12 (twelve) hours.    . gabapentin (NEURONTIN) 300 MG capsule Take 300 mg by mouth 3 (three) times daily.  2  . insulin aspart (NOVOLOG FLEXPEN) 100 UNIT/ML FlexPen Inject 1-15 Units into the skin 3 (three) times daily as needed for high blood sugar (CBG >120). Per sliding scale written down at home    . Insulin Degludec (TRESIBA FLEXTOUCH) 200 UNIT/ML SOPN Inject 60-70 Units into the skin 2 (two) times daily. 70 units in the morning and 60 units at bedtime    . Melatonin 5 MG TABS Take 10 mg by mouth at bedtime.     . metFORMIN (GLUCOPHAGE) 1000 MG tablet Take 1 tablet (1,000 mg total) by mouth 2 (two) times daily. 60 tablet 11  . metoprolol tartrate (LOPRESSOR) 25 MG tablet Take 25 mg by mouth 2 (two) times daily.    [name] omeprazole (PRILOSEC) 20 MG capsule Take 20 mg by mouth 2 (two) times daily before a meal.      . pravastatin (PRAVACHOL) 20 MG tablet Take 20 mg by mouth daily.    [name] RANEXA 500 MG 12 hr tablet Take 1,000 mg by mouth 2 (two) times daily.    [name] topiramate (TOPAMAX) 25 MG capsule Take 25 mg by mouth 2 (two) times daily.    . nitroGLYCERIN (NITROSTAT) 0.4 MG SL tablet Place 1 tablet (0.4 mg total) under the tongue every 5 (five) minutes as needed for chest pain. 25 tablet 3   No current facility-administered medications for this visit.    Allergies:  Imdur [isosorbide dinitrate]; Statins; Tramadol; Tricor [fenofibrate]; and Valproic acid   Social History: The patient  reports that he has never smoked. He has never used smokeless tobacco. He reports that he does not drink alcohol or use drugs.   Family History: The patient's family history includes Coronary artery disease (age of onset: 14) in his father.   ROS:  Please see the history of present illness. Otherwise, complete review of systems is positive for chronic hearing loss, fatigue.  All other systems are reviewed and negative.   Physical Exam: VS:  BP 114/62   Pulse 62   Ht 5' 9"  (1.753 m)   Wt 234 lb (106.1 kg)   SpO2 97%   BMI 34.56 kg/m , BMI Body mass index is 34.56 kg/m.  Wt Readings from Last 3 Encounters:  08/13/17 234 lb (106.1 kg)  08/12/17 232 lb (105.2 kg)  08/11/17 232 lb 1.6 oz (105.3 kg)    General: Obese male, appears comfortable at rest. HEENT: Conjunctiva and lids normal, oropharynx clear. Neck: Supple, no elevated JVP or carotid bruits, no thyromegaly. Lungs: Clear to auscultation, nonlabored breathing at rest. Cardiac: Regular rate and rhythm, no S3 or significant systolic murmur, no pericardial rub. Abdomen: Soft, nontender, bowel sounds present, no guarding or rebound. Extremities: Trace ankle edema, distal pulses 2+. Skin: Warm and dry. Musculoskeletal: No kyphosis. Neuropsychiatric: Alert and oriented x3, affect grossly appropriate.  ECG: I personally reviewed the tracing from 08/10/2017 which  showed normal sinus rhythm with nonspecific T-wave changes.  Recent Labwork: 08/10/2017: TSH 1.280 08/11/2017: ALT 27; AST 24 08/12/2017: BUN 19; Creatinine, Ser 1.04; Hemoglobin 14.6; Platelets 105; Potassium 4.4; Sodium 132     Component Value Date/Time   CHOL 174 08/10/2017 1450   TRIG 245 (H) 08/10/2017 1450   HDL 27 (  L) 08/10/2017 1450   CHOLHDL 6.4 08/10/2017 1450   VLDL 49 (H) 08/10/2017 1450   LDLCALC 98 08/10/2017 1450    Other Studies Reviewed Today:  Cardiac catheterization 08/09/2017: Conclusions: 1. Severe native coronary artery disease, as detailed below. Most likely culprit for the patient's symptoms is progression of severe diffuse distal LAD supplied by the LIMA. 2. Widely patent LIMA to LAD and SVG to RPDA. Free radial to inferior branch of OM1 is patent with approximately 40% ostial stenosis. 3. Mildly elevated left ventricular filling pressure.  Recommendations: 1. Aggressive medical therapy and secondary prevention. There are no interventional targets at this time. 2. Consider gentle diuresis, given elevated left ventricular filling pressure.  Chest x-ray 08/10/2017: FINDINGS: The lungs are adequately inflated and clear. The heart and pulmonary vascularity are normal. The mediastinum is normal in width. There is calcification in the wall of the aortic arch. The sternal wires appear intact. The bony thorax exhibits no acute abnormality.  IMPRESSION: There is no acute cardiopulmonary abnormality.  Thoracic aortic atherosclerosis.  Assessment and Plan:  1. Multivessel CAD status post CABG with recent cardiac catheterization showing patent LIMA to LAD, patent SVG to RPDA, and patent free radial to inferior branch of the first obtuse marginal with only 40% ostial stenosis. He does have diffuse distal LAD disease which could be angina provoking, but this does not fit well with the patient's reported daily chest pain at rest. He is on a good antianginal regimen,  and I am concerned that if we continue to push these doses higher, this is only going to lead to further problems with hypotension. His vital signs are quite stable today. Is also reassuring that he has had no clear evidence of ACS or dynamic ST segment changes at recent ER visits with chest pain.  2. Recurring chest pain, question noncardiac cause. I would like for him to have a chest CTA to evaluate thoracic aorta and also lungs/vasculature. If this is reassuring, he may need further GI evaluation with follow-up per PCP.  3. Mildly elevated LVEDP at cardiac catheterization. Echocardiogram done in May of this year reported LVEF 60-65% with normal diastolic function. He is not on a standing diuretic at this time, although I am somewhat hesitant to add one now in light of his recent trouble with hypotension.  4. Hyperlipidemia with history of statin intolerance but tolerating Pravachol at present.. Recent LDL 98.  5. Type 2 diabetes mellitus, keep follow-up with Dr. Scotty Court.  6. OSA on CPAP. Reports compliance.  7. Essential hypertension, blood pressure is normal today on current regimen.  Current medicines were reviewed with the patient today.   Orders Placed This Encounter  Procedures  . CT ANGIO CHEST AORTA W &/OR WO CONTRAST    Disposition: Follow-up in 6 weeks.  Signed, [name], MD, [facility] 08/13/2017 11:41 AM    [facility] [address], [place], [place] [address] Phone: [phone]; Fax: [phone]

## 2017-08-11 NOTE — Care Management Obs Status (Signed)
[facility] NOTIFICATION   Patient Details  Name: [name] [name] MRN: [id number] Date of Birth: 1956/11/01   Medicare Observation Status Notification Given:  Yes    Jayveion Stalling, Chauncey Reading, RN 08/11/2017, 9:35 AM

## 2017-08-11 NOTE — Progress Notes (Signed)
Late entry from 2300- Dr. Etheleen Nicks came to see pt from Cardiology and stated that he is giving dose of Lovenox as well as Nitropaste since Nitroglycerine makes pts BP drop significantly. Dr Etheleen Nicks also stated that pt has unstable angina and he isn't to worried about his troponin level unless it increases dramatically. RN adv MD if Troponin is critical level we have to call no matter what; he verbalized understanding. Medications and reasoning explained to pt.  Pts wife asking for note to be faxed to job to prove her husband is in hospital. RN adv pts wife that she will have to speak with MD tomorrow and d/t HIPPA I can not fax anything with pts name to anybody. Pts wife verbalized understanding.

## 2017-08-11 NOTE — Discharge Summary (Signed)
Physician Discharge Summary  [name] [name] [id number] DOB: 1956/04/20 DOA: 08/10/2017  PCP: Deloria Lair., MD  Admit date: 08/10/2017 Discharge date: 08/11/2017  Time spent: 45 minutes  Recommendations for Outpatient Follow-up:  -To be discharged home today. -Advised to follow-up with cardiology as scheduled for 10/5.   Discharge Diagnoses:  Active Problems:   Chest pain   Discharge Condition: Stable and improved  Filed Weights   08/10/17 1359 08/10/17 2020 08/11/17 0532  Weight: 107.5 kg (237 lb) 105.8 kg (233 lb 4.8 oz) 105.3 kg (232 lb 1.6 oz)    History of present illness:  As per Dr. Reesa Chew on 10/2: [name] [name] is a 61 y.o. male with medical history significant of CAD status post CABG, GERD, hypertension, asthma, arthritis, chronic low back pain, obstructive sleep apnea, hyperlipidemia, diabetes type 2 shape to the hospital with complaints of chest pain. Patient recently has been dealing with on and off unstable angina and ended up undergoing left heart catheterization yesterday by Dr. Saunders Revel. Results of cardiac catheterization as noted below. It was determined to manage his disease medically and was sent home. After going home patient did well yesterday but this morning started having some headache and mild nausea. Soon after while laying in his bed he started experiencing mild pressure-like left-sided chest pain radiating up to his left jaw. This lasted for several minutes and it resolved. This reoccurred after several minutes and has been on and off since. He called his cardiologist office who asked him to come to the ER for evaluation. He had his CABG back in 2017 and February and has been doing well otherwise. No recent medication changes. In the ER his initial labs were unremarkable including his first set of cardiac enzymes, EKG did not reveal any acute ST-T changes. Case was discussed by the ER physician with Dr. Johnsie Cancel from cardiology who recommended admitting the  patient for observation and try negative enzymes. In the ER he was given nitroglycerin and Tylenol. He was also started on Isordil per cardiology recommendations.   Hospital Course:   Chest pain -Just had cardiac cath on 10/1 with severe native coronary artery disease with patent grafts. -This morning has no chest pain. -Has ruled out for ACS with negative troponins and EKG without acute ischemic changes. -Discussed with Dr. Bronson Ing. Medical management is recommended. He is already on Ranexa. He recommends addition of Imdur for anginal symptoms, however patient refuses this as he states he has tried it in the past and caused him to have really bad headaches. -He has follow-up with cardiology scheduled for 10/5, in 2 days.  Rest of chronic conditions have been stable during this short hospitalization  Procedures:  None   Consultations:  Cardiology, via phone  Discharge Instructions  Discharge Instructions    Diet - low sodium heart healthy    Complete by:  As directed    Increase activity slowly    Complete by:  As directed      Allergies as of 08/11/2017      Reactions   Imdur [isosorbide Dinitrate] Other (See Comments)   Severe headache   Statins Other (See Comments)   Muscle aches and cramps   Tramadol Other (See Comments)   Chest pain    Tricor [fenofibrate] Other (See Comments)   Leg cramps   Valproic Acid Other (See Comments)   "anger"      Medication List    TAKE these medications   acetaminophen 500 MG tablet Commonly  known as:  TYLENOL Take 1,000 mg by mouth every 6 (six) hours as needed (pain).   albuterol 108 (90 Base) MCG/ACT inhaler Commonly known as:  PROVENTIL HFA;VENTOLIN HFA Inhale 2 puffs into the lungs every 6 (six) hours as needed for wheezing or shortness of breath.   albuterol (2.5 MG/3ML) 0.083% nebulizer solution Commonly known as:  PROVENTIL Take 2.5 mg by nebulization every 6 (six) hours as needed for wheezing or shortness of  breath.   aspirin EC 81 MG tablet Take 81 mg by mouth at bedtime.   citalopram 20 MG tablet Commonly known as:  CELEXA Take 20 mg by mouth at bedtime.   clopidogrel 75 MG tablet Commonly known as:  PLAVIX Take 1 tablet (75 mg total) by mouth daily.   fluticasone 50 MCG/ACT nasal spray Commonly known as:  FLONASE Place 1 spray into the nose 2 (two) times daily.   Fluticasone-Salmeterol 250-50 MCG/DOSE Aepb Commonly known as:  ADVAIR Inhale 1 puff into the lungs every 12 (twelve) hours.   gabapentin 300 MG capsule Commonly known as:  NEURONTIN Take 300 mg by mouth 3 (three) times daily.   Melatonin 5 MG Tabs Take 10 mg by mouth at bedtime.   metFORMIN 1000 MG tablet Commonly known as:  GLUCOPHAGE Take 1 tablet (1,000 mg total) by mouth 2 (two) times daily.   metoprolol tartrate 50 MG tablet Commonly known as:  LOPRESSOR Take 50 mg by mouth 2 (two) times daily.   NOVOLOG FLEXPEN 100 UNIT/ML FlexPen Generic drug:  insulin aspart Inject 1-15 Units into the skin 3 (three) times daily as needed for high blood sugar (CBG >120). Per sliding scale written down at home   omeprazole 20 MG capsule Commonly known as:  PRILOSEC Take 20 mg by mouth 2 (two) times daily before a meal.   pravastatin 20 MG tablet Commonly known as:  PRAVACHOL Take 20 mg by mouth daily.   ramipril 5 MG capsule Commonly known as:  ALTACE Take 1 capsule (5 mg total) by mouth daily.   RANEXA 500 MG 12 hr tablet Generic drug:  ranolazine Take 1,000 mg by mouth 2 (two) times daily.   topiramate 25 MG capsule Commonly known as:  TOPAMAX Take 25 mg by mouth 2 (two) times daily.   TRESIBA FLEXTOUCH 200 UNIT/ML Sopn Generic drug:  Insulin Degludec Inject 60-70 Units into the skin 2 (two) times daily. 70 units in the morning and 60 units at bedtime      Allergies  Allergen Reactions  . Imdur [Isosorbide Dinitrate] Other (See Comments)    Severe headache  . Statins Other (See Comments)     Muscle aches and cramps  . Tramadol Other (See Comments)    Chest pain   . Tricor [Fenofibrate] Other (See Comments)    Leg cramps  . Valproic Acid Other (See Comments)    "anger"      The results of significant diagnostics from this hospitalization (including imaging, microbiology, ancillary and laboratory) are listed below for reference.    Significant Diagnostic Studies: Dg Chest Port 1 View  Result Date: 08/10/2017 CLINICAL DATA:  Cardiac catheterization yesterday. Developed chest pain at 1 a.m. today. Also shortness of breath. History of previous CABG as well as coronary stent placement. EXAM: PORTABLE CHEST 1 VIEW COMPARISON:  Chest x-ray of August 03, 2017 FINDINGS: The lungs are adequately inflated and clear. The heart and pulmonary vascularity are normal. The mediastinum is normal in width. There is calcification in the wall of  the aortic arch. The sternal wires appear intact. The bony thorax exhibits no acute abnormality. IMPRESSION: There is no acute cardiopulmonary abnormality. Thoracic aortic atherosclerosis. Electronically Signed   By: David  [name] M.D.   On: 08/10/2017 16:25    Microbiology: No results found for this or any previous visit (from the past 240 hour(s)).   Labs: Basic Metabolic Panel:  Recent Labs Lab 08/10/17 1440 08/11/17 0155  NA 137 136  K 4.4 3.5  CL 105 105  CO2 25 24  GLUCOSE 204* 131*  BUN 14 16  CREATININE 1.08 1.00  CALCIUM 9.1 8.5*   Liver Function Tests:  Recent Labs Lab 08/11/17 0155  AST 24  ALT 27  ALKPHOS 35*  BILITOT 0.5  PROT 6.6  ALBUMIN 3.5   No results for input(s): LIPASE, AMYLASE in the last 168 hours. No results for input(s): AMMONIA in the last 168 hours. CBC:  Recent Labs Lab 08/10/17 1440 08/11/17 0155  WBC 3.0* 3.4*  HGB 14.9 13.6  HCT 41.9 37.8*  MCV 90.3 90.2  PLT 102* 93*   Cardiac Enzymes:  Recent Labs Lab 08/10/17 1440 08/10/17 2020 08/11/17 0155 08/11/17 0737  TROPONINI <0.03  0.03* <0.03 <0.03   BNP: BNP (last 3 results) No results for input(s): BNP in the last 8760 hours.  ProBNP (last 3 results) No results for input(s): PROBNP in the last 8760 hours.  CBG:  Recent Labs Lab 08/09/17 1118 08/09/17 1511 08/10/17 2003 08/11/17 0743 08/11/17 1103  GLUCAP 149* 104* 155* 101* 115*       Signed:  HERNANDEZ ACOSTA,Helma Argyle  [facility] Pager: [phone] 08/11/2017, 6:15 PM

## 2017-08-11 NOTE — Telephone Encounter (Signed)
This is clearly something that I am not going to be able to manage over the phone. I did review his cardiac catheterization and his bypass grafts are patent. He does have progressive native vessel disease that could be angina provoking, but it is difficult to say that all of his chest pain symptoms are ischemic in etiology. We will certainly review his medical regimen at office follow-up and see if there is anything else we can further adjust.

## 2017-08-11 NOTE — Plan of Care (Signed)
Problem: Pain Managment: Goal: General experience of comfort will improve Outcome: Not Progressing Pt c/o 4/10 CP and has been given Morphine x1 so far this shift. Nitropaste applied and Tylenol given for HA from Nitro. Pt educated on pain mgmt, as well as medications available. MD made aware that 61m Morphine was not helping pts pain per pts wife, increased Morphine dose to 228m  Pt verbalized understanding. Will continue to monitor pt.

## 2017-08-12 ENCOUNTER — Emergency Department (HOSPITAL_COMMUNITY): Payer: Medicare Other

## 2017-08-12 ENCOUNTER — Encounter (HOSPITAL_COMMUNITY): Payer: Self-pay | Admitting: Emergency Medicine

## 2017-08-12 ENCOUNTER — Telehealth: Payer: Self-pay

## 2017-08-12 ENCOUNTER — Emergency Department (HOSPITAL_COMMUNITY)
Admission: EM | Admit: 2017-08-12 | Discharge: 2017-08-12 | Disposition: A | Discharge status: Home / Self Care | Payer: Medicare Other | Attending: Emergency Medicine | Admitting: Emergency Medicine

## 2017-08-12 DIAGNOSIS — I25119 Atherosclerotic heart disease of native coronary artery with unspecified angina pectoris: Secondary | ICD-10-CM | POA: Insufficient documentation

## 2017-08-12 DIAGNOSIS — R079 Chest pain, unspecified: Secondary | ICD-10-CM | POA: Diagnosis present

## 2017-08-12 DIAGNOSIS — E119 Type 2 diabetes mellitus without complications: Secondary | ICD-10-CM | POA: Insufficient documentation

## 2017-08-12 DIAGNOSIS — I208 Other forms of angina pectoris: Secondary | ICD-10-CM

## 2017-08-12 DIAGNOSIS — Z951 Presence of aortocoronary bypass graft: Secondary | ICD-10-CM | POA: Insufficient documentation

## 2017-08-12 DIAGNOSIS — Z7984 Long term (current) use of oral hypoglycemic drugs: Secondary | ICD-10-CM | POA: Diagnosis not present

## 2017-08-12 DIAGNOSIS — Z79899 Other long term (current) drug therapy: Secondary | ICD-10-CM | POA: Diagnosis not present

## 2017-08-12 DIAGNOSIS — E7800 Pure hypercholesterolemia, unspecified: Secondary | ICD-10-CM | POA: Insufficient documentation

## 2017-08-12 DIAGNOSIS — D696 Thrombocytopenia, unspecified: Secondary | ICD-10-CM | POA: Insufficient documentation

## 2017-08-12 DIAGNOSIS — I25118 Atherosclerotic heart disease of native coronary artery with other forms of angina pectoris: Secondary | ICD-10-CM | POA: Diagnosis not present

## 2017-08-12 DIAGNOSIS — I10 Essential (primary) hypertension: Secondary | ICD-10-CM | POA: Insufficient documentation

## 2017-08-12 DIAGNOSIS — J45909 Unspecified asthma, uncomplicated: Secondary | ICD-10-CM | POA: Diagnosis not present

## 2017-08-12 DIAGNOSIS — Z7982 Long term (current) use of aspirin: Secondary | ICD-10-CM | POA: Diagnosis not present

## 2017-08-12 DIAGNOSIS — I252 Old myocardial infarction: Secondary | ICD-10-CM | POA: Diagnosis not present

## 2017-08-12 DIAGNOSIS — Z8673 Personal history of transient ischemic attack (TIA), and cerebral infarction without residual deficits: Secondary | ICD-10-CM | POA: Insufficient documentation

## 2017-08-12 LAB — BASIC METABOLIC PANEL
Anion gap: 9 (ref 5–15)
BUN: 19 mg/dL (ref 6–20)
CALCIUM: 8.9 mg/dL (ref 8.9–10.3)
CHLORIDE: 102 mmol/L (ref 101–111)
CO2: 21 mmol/L — AB (ref 22–32)
CREATININE: 1.04 mg/dL (ref 0.61–1.24)
GFR calc non Af Amer: >60 mL/min (ref >60)
Glucose, Bld: 250 mg/dL — ABNORMAL HIGH (ref 65–99)
Potassium: 4.4 mmol/L (ref 3.5–5.1)
SODIUM: 132 mmol/L — AB (ref 135–145)

## 2017-08-12 LAB — CBC
HCT: 40 % (ref 39.0–52.0)
Hemoglobin: 14.6 g/dL (ref 13.0–17.0)
MCH: 32.6 pg (ref 26.0–34.0)
MCHC: 36.5 g/dL — ABNORMAL HIGH (ref 30.0–36.0)
MCV: 89.3 fL (ref 78.0–100.0)
PLATELETS: 105 10*3/uL — AB (ref 150–400)
RBC: 4.48 MIL/uL (ref 4.22–5.81)
RDW: 12.6 % (ref 11.5–15.5)
WBC: 3.3 10*3/uL — ABNORMAL LOW (ref 4.0–10.5)

## 2017-08-12 LAB — URINALYSIS, ROUTINE W REFLEX MICROSCOPIC
BILIRUBIN URINE: NEGATIVE
GLUCOSE, UA: 50 mg/dL — AB
HGB URINE DIPSTICK: NEGATIVE
KETONES UR: NEGATIVE mg/dL
Leukocytes, UA: NEGATIVE
Nitrite: NEGATIVE
PH: 5 (ref 5.0–8.0)
Protein, ur: NEGATIVE mg/dL
SPECIFIC GRAVITY, URINE: 1.019 (ref 1.005–1.030)

## 2017-08-12 LAB — I-STAT TROPONIN, ED: TROPONIN I, POC: 0 ng/mL (ref 0.00–0.08)

## 2017-08-12 LAB — HIV ANTIBODY (ROUTINE TESTING W REFLEX): HIV SCREEN 4TH GENERATION: NONREACTIVE

## 2017-08-12 MED ORDER — POTASSIUM CHLORIDE CRYS ER 20 MEQ PO TBCR: 40.0000 meq | EXTENDED_RELEASE_TABLET | Freq: Once | ORAL | Status: DC

## 2017-08-12 MED ORDER — AMLODIPINE BESYLATE 2.5 MG PO TABS: 2.5000 mg | ORAL_TABLET | Freq: Every day | ORAL | 0 refills | Status: DC

## 2017-08-12 NOTE — ED Provider Notes (Signed)
[facility] DEPT Provider Note   CSN: [id number] Arrival date & time: 08/12/17  1408     History   Chief Complaint Chief Complaint  Patient presents with  . Chest Pain    HPI [name] [name] is a 61 y.o. male.  Pt presents to the ED today with cp and exertional dyspnea.  The pt has a hx of CAD and had a cath on 10/1 which showed:  Conclusions: 1. Severe native coronary artery disease, as detailed below. Most likely culprit for the patient's symptoms is progression of severe diffuse distal LAD supplied by the LIMA. 2. Widely patent LIMA to LAD and SVG to RPDA. Free radial to inferior branch of OM1 is patent with approximately 40% ostial stenosis. 3. Mildly elevated left ventricular filling pressure.  Recommendations: 1. Aggressive medical therapy and secondary prevention. There are no interventional targets at this time. 2. Consider gentle diuresis, given elevated left ventricular filling pressure.  Pt came to the ED on 10/2 with CP and was admitted for observation and r/o.  The pt was d/c home with nitro patches which he does not want to take, he'd rather had the SL version.  Pt presented to the ED again today with CP.  He was also concerned about severe SOB with just walking across the room.  His cp and sob resolved with rest.        Past Medical History:  Diagnosis Date  . Anxiety   . Arthritis   . Asthma   . Cataract    Right eye  . Chronic lower back pain   . Coronary artery disease involving native coronary artery with unstable angina pectoris ([place])   . Coronary atherosclerosis of native coronary artery    a. LAD & OM stenting;  b. 2007 Cath: nonobs dzs, patent stents;  c. 07/2012 neg MV, EF 61%.  d. 11/20/14: [place] s/p  PCI w/ DES to pLAD and DES to OM1   . Essential hypertension, benign   . GERD (gastroesophageal reflux disease)   . Hearing loss of left ear   . History of gout   . History of hiatal hernia   . Hypercholesteremia   . Lumbar herniated disc     . MI, old   . Migraine   . OSA on CPAP   . 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 Thrombocytopenia ([place])   . TIA (transient ischemic attack) ~ 2010  . Type 2 diabetes mellitus [facility])     Patient Active Problem List   Diagnosis Date Noted  . Chest pain 08/10/2017  . 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 Coronary artery disease involving native coronary artery with unstable angina pectoris ([place])   . CAD -S/P PCI LAD OM/LAD '13 and 2016 12/31/2015  . Hyperlipidemia   . Chest pain with high risk of acute coronary syndrome 12/23/2015  . Precordial chest pain 12/03/2014  . TIA (transient ischemic attack)   . CAD- diffuse LAD disease at cath 12/30/15   . Thrombocytopenia ([place])   . Accelerating angina ([place]) 12/03/2013  . Type 2 diabetes mellitus ([place])   . Anxiety   . PVC's (premature ventricular contractions) 01/06/2013  . Obstructive sleep apnea-declines C-pap 07/04/2012  . Mixed hyperlipidemia 07/30/2009  . Essential hypertension, benign 07/30/2009    Past Surgical History:  Procedure Laterality Date  . ANTERIOR CERVICAL DECOMP/DISCECTOMY FUSION  1998   "C3-4"  .  CARDIAC CATHETERIZATION  "several"  . CARDIAC CATHETERIZATION N/A 12/30/2015   Procedure: Left Heart Cath and Coronary Angiography;  Surgeon: [name], MD;  Location: [place] CV LAB;  Service: Cardiovascular;  Laterality: N/A;  . CARPAL TUNNEL RELEASE Bilateral 2005  . COLONOSCOPY    . CORONARY ANGIOPLASTY    . CORONARY ANGIOPLASTY WITH STENT PLACEMENT  2002; 2003; 11/20/2014   "I have 4 stents after today" (11/20/2014)  . CORONARY ARTERY BYPASS GRAFT N/A 01/09/2016   Procedure: CORONARY ARTERY BYPASS GRAFTING (CABG) X 3 UTILIZING RIGHT AND LEFT INTERNAL MAMMARY ARTERY AND ENDOSCOPICALLY HARVESTED SAPHENEOUS VEIN.;  Surgeon: Rexene Alberts, MD;  Location: [place];  Service: Open Heart Surgery;  Laterality: N/A;  . ESOPHAGOGASTRODUODENOSCOPY    . KNEE SURGERY  Left 02/2012   "scraped; open"  . LEFT HEART CATH AND CORS/GRAFTS ANGIOGRAPHY N/A 08/09/2017   Procedure: LEFT HEART CATH AND CORS/GRAFTS ANGIOGRAPHY;  Surgeon: [name], MD;  Location: [place] CV LAB;  Service: Cardiovascular;  Laterality: N/A;  . LEFT HEART CATHETERIZATION WITH CORONARY ANGIOGRAM N/A 07/20/2012   Procedure: LEFT HEART CATHETERIZATION WITH CORONARY ANGIOGRAM;  Surgeon: [name], MD;  Location: [place] CATH LAB;  Service: Cardiovascular;  Laterality: N/A;  . LEFT HEART CATHETERIZATION WITH CORONARY ANGIOGRAM N/A 11/20/2014   Procedure: LEFT HEART CATHETERIZATION WITH CORONARY ANGIOGRAM;  Surgeon: [name], MD;  Location: [facility] CATH LAB;  Service: Cardiovascular;  Laterality: N/A;  . LEFT HEART CATHETERIZATION WITH CORONARY ANGIOGRAM N/A 11/26/2014   Procedure: LEFT HEART CATHETERIZATION WITH CORONARY ANGIOGRAM;  Surgeon: [name], MD;  Location: [facility] CATH LAB;  Service: Cardiovascular;  Laterality: N/A;  . NEUROPLASTY / TRANSPOSITION ULNAR NERVE AT ELBOW Right ~ 2012  . PERCUTANEOUS CORONARY ROTOBLATOR INTERVENTION (PCI-R)  11/20/2014   Procedure: PERCUTANEOUS CORONARY ROTOBLATOR INTERVENTION (PCI-R);  Surgeon: [name], MD;  Location: [facility] C[facility] LAB;  Service: Cardiovascular;;  . SHOULDER ARTHROSCOPY Left ~ 2011  . TEE WITHOUT CARDIOVERSION N/A 01/09/2016   Procedure: TRANSESOPHAGEAL ECHOCARDIOGRAM (TEE);  Surgeon: Rexene Alberts, MD;  Location: [place];  Service: Open Heart Surgery;  Laterality: N/A;       Home Medications    Prior to Admission medications   Medication Sig Start Date End Date Taking? Authorizing Provider  acetaminophen (TYLENOL) 500 MG tablet Take 1,000 mg by mouth every 6 (six) hours as needed (pain).   Yes [provider]  albuterol (PROVENTIL HFA;VENTOLIN HFA) 108 (90 BASE) MCG/ACT inhaler Inhale 2 puffs into the lungs every 6 (six) hours as needed for wheezing or shortness of breath.   Yes [provider]  albuterol  (PROVENTIL) (2.5 MG/3ML) 0.083% nebulizer solution Take 2.5 mg by nebulization every 6 (six) hours as needed for wheezing or shortness of breath.   Yes [provider]  aspirin EC 81 MG tablet Take 81 mg by mouth at bedtime.    Yes [provider]  citalopram (CELEXA) 20 MG tablet Take 20 mg by mouth at bedtime.    Yes [provider]  clopidogrel (PLAVIX) 75 MG tablet Take 1 tablet (75 mg total) by mouth daily. 01/14/16  Yes Tacy Dura, Donielle M, PA-C  fluticasone (FLONASE) 50 MCG/ACT nasal spray Place 1 spray into the nose 2 (two) times daily.    Yes [provider]  Fluticasone-Salmeterol (ADVAIR) 250-50 MCG/DOSE AEPB Inhale 1 puff into the lungs every 12 (twelve) hours.   Yes [provider]  gabapentin (NEURONTIN) 300 MG capsule Take 300 mg by mouth 3 (  three) times daily. 07/09/17  Yes [provider]  insulin aspart (NOVOLOG FLEXPEN) 100 UNIT/ML FlexPen Inject 1-15 Units into the skin 3 (three) times daily as needed for high blood sugar (CBG >120). Per sliding scale written down at home   Yes [provider]  Insulin Degludec (TRESIBA FLEXTOUCH) 200 UNIT/ML SOPN Inject 60-70 Units into the skin 2 (two) times daily. 70 units in the morning and 60 units at bedtime   Yes [provider]  Melatonin 5 MG TABS Take 10 mg by mouth at bedtime.    Yes [provider]  metFORMIN (GLUCOPHAGE) 1000 MG tablet Take 1 tablet (1,000 mg total) by mouth 2 (two) times daily. 08/12/17  Yes End, Harrell Gave, MD  metoprolol tartrate (LOPRESSOR) 25 MG tablet Take 25 mg by mouth 2 (two) times daily.   Yes [provider]  omeprazole (PRILOSEC) 20 MG capsule Take 20 mg by mouth 2 (two) times daily before a meal.   Yes [provider]  pravastatin (PRAVACHOL) 20 MG tablet Take 20 mg by mouth daily.   Yes [provider]  RANEXA 500 MG 12 hr tablet Take 1,000 mg by mouth 2 (two) times daily. 04/13/17  Yes [provider]  topiramate (TOPAMAX) 25 MG capsule Take 25 mg by mouth 2 (two) times daily.   Yes [provider]  amLODipine (NORVASC) 2.5 MG tablet Take 1 tablet (2.5 mg total) by mouth daily. 08/12/17   Isla Pence, MD  nitroGLYCERIN (NITRODUR - DOSED IN MG/24 HR) 0.2 mg/hr patch Place 1 patch onto the skin daily. 08/11/17   [provider]    Family History Family History  Problem Relation Age of Onset  . Coronary artery disease Father 52    Social History Social History  Substance Use Topics  . Smoking status: Never Smoker  . Smokeless tobacco: Never Used  . Alcohol use No     Allergies   Imdur [isosorbide dinitrate]; Statins; Tramadol; Tricor [fenofibrate]; and Valproic acid   Review of Systems Review of Systems  Respiratory: Positive for shortness of breath.   Cardiovascular: Positive for chest pain.  All other systems reviewed and are negative.    Physical Exam Updated Vital Signs BP 118/73   Pulse (!) 57   Resp 11   Ht 5' 9"  (1.753 m)   Wt 105.2 kg (232 lb)   SpO2 98%   BMI 34.26 kg/m   Physical Exam  Constitutional: He is oriented to person, place, and time. He appears well-developed and well-nourished.  HENT:  Head: Normocephalic and atraumatic.  Right Ear: External ear normal.  Left Ear: External ear normal.  Nose: Nose normal.  Mouth/Throat: Oropharynx is clear and moist.  Eyes: Pupils are equal, round, and reactive to light. Conjunctivae and EOM are normal.  Neck: Normal range of motion. Neck supple.  Cardiovascular: Normal rate, regular rhythm, normal heart sounds and intact distal pulses.   Pulmonary/Chest: Effort normal and breath sounds normal.  Abdominal: Soft. Bowel sounds are normal.  Musculoskeletal: Normal range of motion.  Neurological: He is alert and oriented to person, place, and time.  Skin: Skin is warm. Capillary refill takes less than 2 seconds.  Psychiatric: He has a normal mood and affect. His behavior is  normal. Judgment and thought content normal.  Nursing note and vitals reviewed.    ED Treatments / Results  Labs (all labs ordered are listed, but only abnormal results are displayed) Labs Reviewed  BASIC METABOLIC PANEL - Abnormal; Notable  for the following:       Result Value   Sodium 132 (*)    CO2 21 (*)    Glucose, Bld 250 (*)    All other components within normal limits  CBC - Abnormal; Notable for the following:    WBC 3.3 (*)    MCHC 36.5 (*)    Platelets 105 (*)    All other components within normal limits  URINALYSIS, ROUTINE W REFLEX MICROSCOPIC - Abnormal; Notable for the following:    Glucose, UA 50 (*)    All other components within normal limits  I-STAT TROPONIN, ED    EKG  EKG Interpretation  Date/Time:  Thursday August 12 2017 14:13:04 EDT Ventricular Rate:  61 PR Interval:  144 QRS Duration: 88 QT Interval:  446 QTC Calculation: 448 R Axis:   55 Text Interpretation:  Normal sinus rhythm Normal ECG Confirmed by Isla Pence [phone]) on 08/12/2017 3:08:03 PM       Radiology Dg Chest 2 View  Result Date: 08/12/2017 CLINICAL DATA:  Chest pain. EXAM: CHEST  2 VIEW COMPARISON:  Radiograph of August 10, 2017. FINDINGS: The heart size and mediastinal contours are within normal limits. No pneumothorax or pleural effusion is noted. Status post coronary artery bypass graft. Both lungs are clear. The visualized skeletal structures are unremarkable. IMPRESSION: No active cardiopulmonary disease. Electronically Signed   By: Marijo Conception, M.D.   On: 08/12/2017 14:50    Procedures Procedures (including critical care time)  Medications Ordered in ED Medications - No data to display   Initial Impression / Assessment and Plan / ED Course  I have reviewed the triage vital signs and the nursing notes.  Pertinent labs & imaging results that were available during my care of the patient were reviewed by me and considered in my medical decision making (see chart  for details).   Pt d/w Dr. Harl Bowie (cardiology) who did see pt in the ED. He recommends stopping ramipril and starting norvasc daily.  Unfortunately, medical therapy has been limited by orthostasis, bradycardia, and imdur allergy.  Pt given rx for norvasc and I also told him Dr. Harl Bowie wanted him to stop the ramipril.  He knows to return if worse.  He has an appt with Dr. Domenic Polite tomorrow.     Final Clinical Impressions(s) / ED Diagnoses   Final diagnoses:  Thrombocytopenia (HCC)  Stable angina (HCC)    New Prescriptions New Prescriptions   AMLODIPINE (NORVASC) 2.5 MG TABLET    Take 1 tablet (2.5 mg total) by mouth daily.     Isla Pence, MD 08/12/17 [phone]

## 2017-08-12 NOTE — ED Triage Notes (Signed)
Pt brought in EMS after waking up this am with chest pain. Pt reports increase dyspnea with position changing. EMS reports pt orthostatic. Pt recently had cardiac cath with no stent placement. Pt reports was given nitro prescription but has not been able to fill. EMS administered 383m aspirin en route. Pt reports chest pain 3/10. Mild dyspnea noted at rest at time of arrival.

## 2017-08-12 NOTE — Telephone Encounter (Signed)
Patient contacted office stating he was given nitro patches from the hospital and he cant wear them. Patient would like to be prescribed SL nitro

## 2017-08-12 NOTE — Telephone Encounter (Signed)
Left message with son to return call.

## 2017-08-12 NOTE — Consult Note (Signed)
Primary cardiologist: Dr Johnny Bridge Consulting cardiologist: Dr Carlyle Dolly Requesting physician: Dr Gilford Raid Indication: chest pain  Clinical Summary [name] [name] is a 61 y.o.male history of CAD with prior 3V CABG 01/2016 (LIMA-LAD, RIMA-OM2, SVG-PDA) and prior stents,  Asthma, HTN, GERD, HL, OSA on cpap, DM2 presents with chest pain. Multiple ER visits recent for chest pain at [facility] and [facility], negative workup for ACS. Recent cath 08/2017 with patent grafts, though symptoms may be related to diffuse distal LAD disease that is supplied by the LIMA. No targets for intervention.   Orthostatic dizziness. He has statins and imdur listed as allergies. Antianginal therapy has been limited by low bp's, heart rates, and orthostatic symptoms.  Presents with episode of 5/10 chest pain mid to left chest, +SOB. Lasted several minutes. Similar to his prior angina.   08/09/17 cath: severe native CAD, patent LIMA-LAD and SVG-RPDA, patent RIMA graft to OM1.  03/2017 echo [facility]: LVEF 86-57%, normal diastolic function, moderate AI K 4.4, Cr 1.04, WBC 3.3, Hgb 14.6, Plt 105, Trop neg x 1 CXR no acute process EKG SR, nonspecific ST/T changes  Allergies  Allergen Reactions  . Imdur [Isosorbide Dinitrate] Other (See Comments)    Severe headache  . Statins Other (See Comments)    Muscle aches and cramps  . Tramadol Other (See Comments)    Chest pain   . Tricor [Fenofibrate] Other (See Comments)    Leg cramps  . Valproic Acid Other (See Comments)    "anger"    Medications Scheduled Medications:    Infusions:    PRN Medications:     Past Medical History:  Diagnosis Date  . Anxiety   . Arthritis   . Asthma   . Cataract    Right eye  . Chronic lower back pain   . Coronary artery disease involving native coronary artery with unstable angina pectoris ([place])   . Coronary atherosclerosis of native coronary artery    a. LAD & OM stenting;  b. 2007 Cath: nonobs dzs,  patent stents;  c. 07/2012 neg MV, EF 61%.  d. 11/20/14: [place] s/p  PCI w/ DES to pLAD and DES to OM1   . Essential hypertension, benign   . GERD (gastroesophageal reflux disease)   . Hearing loss of left ear   . History of gout   . History of hiatal hernia   . Hypercholesteremia   . Lumbar herniated disc   . MI, old   . Migraine   . OSA on CPAP   . 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 Thrombocytopenia ([place])   . TIA (transient ischemic attack) ~ 2010  . Type 2 diabetes mellitus ([place])     Past Surgical History:  Procedure Laterality Date  . ANTERIOR CERVICAL DECOMP/DISCECTOMY FUSION  1998   "C3-4"  . CARDIAC CATHETERIZATION  "several"  . CARDIAC CATHETERIZATION N/A 12/30/2015   Procedure: Left Heart Cath and Coronary Angiography;  Surgeon: [name], MD;  Location: [place] CV LAB;  Service: Cardiovascular;  Laterality: N/A;  . CARPAL TUNNEL RELEASE Bilateral 2005  . COLONOSCOPY    . CORONARY ANGIOPLASTY    . CORONARY ANGIOPLASTY WITH STENT PLACEMENT  2002; 2003; 11/20/2014   "I have 4 stents after today" (11/20/2014)  . CORONARY ARTERY BYPASS GRAFT N/A 01/09/2016   Procedure: CORONARY ARTERY BYPASS GRAFTING (CABG) X 3 UTILIZING RIGHT AND  LEFT INTERNAL MAMMARY ARTERY AND ENDOSCOPICALLY HARVESTED SAPHENEOUS VEIN.;  Surgeon: Rexene Alberts, MD;  Location: [place];  Service: Open Heart Surgery;  Laterality: N/A;  . ESOPHAGOGASTRODUODENOSCOPY    . KNEE SURGERY Left 02/2012   "scraped; open"  . LEFT HEART CATH AND CORS/GRAFTS ANGIOGRAPHY N/A 08/09/2017   Procedure: LEFT HEART CATH AND CORS/GRAFTS ANGIOGRAPHY;  Surgeon: [name], MD;  Location: [place] CV LAB;  Service: Cardiovascular;  Laterality: N/A;  . LEFT HEART CATHETERIZATION WITH CORONARY ANGIOGRAM N/A 07/20/2012   Procedure: LEFT HEART CATHETERIZATION WITH CORONARY ANGIOGRAM;  Surgeon: [name], MD;  Location: [place] CATH LAB;  Service:  Cardiovascular;  Laterality: N/A;  . LEFT HEART CATHETERIZATION WITH CORONARY ANGIOGRAM N/A 11/20/2014   Procedure: LEFT HEART CATHETERIZATION WITH CORONARY ANGIOGRAM;  Surgeon: [name], MD;  Location: [facility] CATH LAB;  Service: Cardiovascular;  Laterality: N/A;  . LEFT HEART CATHETERIZATION WITH CORONARY ANGIOGRAM N/A 11/26/2014   Procedure: LEFT HEART CATHETERIZATION WITH CORONARY ANGIOGRAM;  Surgeon: [name], MD;  Location: [facility] CATH LAB;  Service: Cardiovascular;  Laterality: N/A;  . NEUROPLASTY / TRANSPOSITION ULNAR NERVE AT ELBOW Right ~ 2012  . PERCUTANEOUS CORONARY ROTOBLATOR INTERVENTION (PCI-R)  11/20/2014   Procedure: PERCUTANEOUS CORONARY ROTOBLATOR INTERVENTION (PCI-R);  Surgeon: [name], MD;  Location: [facility] CATH LAB;  Service: Cardiovascular;;  . SHOULDER ARTHROSCOPY Left ~ 2011  . TEE WITHOUT CARDIOVERSION N/A 01/09/2016   Procedure: TRANSESOPHAGEAL ECHOCARDIOGRAM (TEE);  Surgeon: Rexene Alberts, MD;  Location: [place];  Service: Open Heart Surgery;  Laterality: N/A;    Family History  Problem Relation Age of Onset  . Coronary artery disease Father 11    Social History [name] [name] reports that he has never smoked. He has never used smokeless tobacco. [name] [name] reports that he does not drink alcohol.  Review of Systems CONSTITUTIONAL: No weight loss, fever, chills, weakness or fatigue.  HEENT: Eyes: No visual loss, blurred vision, double vision or yellow sclerae. No hearing loss, sneezing, congestion, runny nose or sore throat.  SKIN: No rash or itching.  CARDIOVASCULAR: per hpi RESPIRATORY: per hpi GASTROINTESTINAL: No anorexia, nausea, vomiting or diarrhea. No abdominal pain or blood.  GENITOURINARY: no polyuria, no dysuria NEUROLOGICAL: No headache, dizziness, syncope, paralysis, ataxia, numbness or tingling in the extremities. No change in bowel or bladder control.  MUSCULOSKELETAL: No muscle, back pain, joint pain or stiffness.  HEMATOLOGIC: No anemia,  bleeding or bruising.  LYMPHATICS: No enlarged nodes. No history of splenectomy.  PSYCHIATRIC: No history of depression or anxiety.      Physical Examination Blood pressure 110/61, pulse 61, resp. rate 14, height 5' 9"  (1.753 m), weight 232 lb (105.2 kg), SpO2 99 %. No intake or output data in the 24 hours ending 08/12/17 1546  HEENT: sclera clear, throat clear  Cardiovascular: RRR no m/rg, no jvd  Respiratory: CTAB  GI: abdomen soft, NT, ND  MSK: no Le edema  Neuro: no focal deficits  Psych: appropriate affect   Lab Results  Basic Metabolic Panel:  Recent Labs Lab 08/10/17 1440 08/11/17 0155 08/12/17 1419  NA 137 136 132*  K 4.4 3.5 4.4  CL 105 105 102  CO2 25 24 21*  GLUCOSE 204* 131* 250*  BUN 14 16 19   CREATININE 1.08 1.00 1.04  CALCIUM 9.1 8.5* 8.9    Liver Function Tests:  Recent Labs Lab 08/11/17 0155  AST 24  ALT 27  ALKPHOS 35*  BILITOT 0.5  PROT 6.6  ALBUMIN 3.5  CBC:  Recent Labs Lab 08/10/17 1440 08/11/17 0155 08/12/17 1419  WBC 3.0* 3.4* 3.3*  HGB 14.9 13.6 14.6  HCT 41.9 37.8* 40.0  MCV 90.3 90.2 89.3  PLT 102* 93* 105*    Cardiac Enzymes:  Recent Labs Lab 08/10/17 1440 08/10/17 2020 08/11/17 0155 08/11/17 0737  TROPONINI <0.03 0.03* <0.03 <0.03    BNP: Invalid input(s): POCBNP     Impression/Recommendations 1. Chest pain - recent cath with patent grafts, thought symptoms may be related to distal LAD disease. No targets for intervention. Multiple ER visits and recent admission for ongoing chest, has not had evidence of infarct by EKG or enzymes - medical therapy limited by orthostatic dizziness, bradycardia, and imdur allergy - we will stop ramipril at this time, start norvasc 2.22m daily as additional antianginal. If tolerates may consider starting back low dose ACE-I in the future, however given recurrent issues with angina would prioritize antianginal therapy at this time     Keep f/u with Dr MGwen Hertomorrow. [place]. Please stop his ramilpril and provide a Rx for norvasc 2.555mdaily. Keep f/u tomorrow with cards Dr McDomenic PoliteJoCarlyle DollyM.D.

## 2017-08-12 NOTE — Discharge Instructions (Signed)
Stop Ramipril.

## 2017-08-12 NOTE — Telephone Encounter (Signed)
Wife Stanton Kidney) notified.  She will be here tomorrow with patient at his appointment.

## 2017-08-13 ENCOUNTER — Ambulatory Visit (HOSPITAL_COMMUNITY)
Admission: RE | Admit: 2017-08-13 | Discharge: 2017-08-13 | Disposition: A | Discharge status: Home / Self Care | Payer: Medicare Other | Source: Ambulatory Visit | Attending: Cardiology | Admitting: Cardiology

## 2017-08-13 ENCOUNTER — Encounter (HOSPITAL_COMMUNITY): Payer: Self-pay

## 2017-08-13 ENCOUNTER — Telehealth: Payer: Self-pay | Admitting: Cardiology

## 2017-08-13 ENCOUNTER — Ambulatory Visit (INDEPENDENT_AMBULATORY_CARE_PROVIDER_SITE_OTHER): Payer: Medicare Other | Admitting: Cardiology

## 2017-08-13 ENCOUNTER — Encounter: Payer: Self-pay | Admitting: Cardiology

## 2017-08-13 VITALS — BP 114/62 | HR 62 | Ht 69.0 in | Wt 234.0 lb

## 2017-08-13 DIAGNOSIS — I2699 Other pulmonary embolism without acute cor pulmonale: Secondary | ICD-10-CM

## 2017-08-13 DIAGNOSIS — E1159 Type 2 diabetes mellitus with other circulatory complications: Secondary | ICD-10-CM | POA: Diagnosis not present

## 2017-08-13 DIAGNOSIS — I200 Unstable angina: Secondary | ICD-10-CM

## 2017-08-13 DIAGNOSIS — E782 Mixed hyperlipidemia: Secondary | ICD-10-CM

## 2017-08-13 DIAGNOSIS — I10 Essential (primary) hypertension: Secondary | ICD-10-CM

## 2017-08-13 DIAGNOSIS — Z789 Other specified health status: Secondary | ICD-10-CM | POA: Diagnosis not present

## 2017-08-13 DIAGNOSIS — I700 Atherosclerosis of aorta: Secondary | ICD-10-CM | POA: Insufficient documentation

## 2017-08-13 DIAGNOSIS — R079 Chest pain, unspecified: Secondary | ICD-10-CM

## 2017-08-13 DIAGNOSIS — I25119 Atherosclerotic heart disease of native coronary artery with unspecified angina pectoris: Secondary | ICD-10-CM | POA: Insufficient documentation

## 2017-08-13 MED ORDER — IOPAMIDOL (ISOVUE-370) INJECTION 76%
100.0000 mL | Freq: Once | INTRAVENOUS | Status: AC | PRN
  Administered 2017-08-13: 100 mL via INTRAVENOUS

## 2017-08-13 MED ORDER — NITROGLYCERIN 0.4 MG SL SUBL: 0.4000 mg | SUBLINGUAL_TABLET | SUBLINGUAL | 3 refills | Status: DC | PRN

## 2017-08-13 NOTE — Telephone Encounter (Signed)
Pre-cert Verification for the following procedure   CT ANGIO CHEST PE W/CM &/OR WO  STAT  Scheduled for 08-13-17 at [facility]

## 2017-08-13 NOTE — Patient Instructions (Signed)
Medication Instructions:  Your physician has recommended you make the following change in your medication:  Begin Nitroglycerin as directed  Labwork: NONE  Testing/Procedures: Non-Cardiac CT Angiography (CTA), is a special type of CT scan that uses a computer to produce multi-dimensional views of major blood vessels throughout the body. In CT angiography, a contrast material is injected through an IV to help visualize the blood vessels  Follow-Up: Your physician recommends that you schedule a follow-up appointment in: [place]  Any Other Special Instructions Will Be Listed Below (If Applicable).  If you need a refill on your cardiac medications before your next appointment, please call your pharmacy.

## 2017-08-13 NOTE — Telephone Encounter (Signed)
Patient notified

## 2017-08-13 NOTE — Telephone Encounter (Signed)
He has a visit scheduled this morning, and we will review his medications at that time.

## 2017-08-13 NOTE — Addendum Note (Signed)
Addended by: Acquanetta Chain on: 08/13/2017 11:57 AM   Modules accepted: Orders

## 2017-08-16 ENCOUNTER — Telehealth: Payer: Self-pay

## 2017-08-16 NOTE — Telephone Encounter (Signed)
Patient notified. Routed to PCP 

## 2017-08-16 NOTE — Telephone Encounter (Signed)
-----   Message from Acquanetta Chain, LPN sent at 38/06/8756 12:46 PM EDT -----   ----- Message ----- From: [name], MD Sent: 08/13/2017   2:20 PM To: [name], LPN  Results reviewed. Please let him know that the chest CT was reassuring, no acute cardiopulmonary diagnosis to explain his chest pain. In light of this would continue with present medical therapy for CAD, he should follow-up with Dr. Scotty Court and may want to be considered for further GI evaluation. A copy of this test should be forwarded to Deloria Lair., MD.

## 2017-08-19 ENCOUNTER — Telehealth: Payer: Self-pay

## 2017-08-19 NOTE — Telephone Encounter (Signed)
Patient notified and verbalized understanding. 

## 2017-08-19 NOTE — Telephone Encounter (Signed)
Patient states he had cath on 9/24 and he is now having severe pain in his left arm. Patient rates the pain at a 7/10. Patient understands his arm will be sore, but this is becoming severe. Pain started a couple days ago, but has gradually gotten worse. Patient reports no other symptoms and no injury to arm. Patient is very concerned this pain is coming from cath

## 2017-08-19 NOTE — Telephone Encounter (Signed)
It would be very unusual for him to start having severe left arm pain at this point after a cardiac catheterization on September 24. He was not experiencing these symptoms when I saw him recently in the office. I reviewed the procedure report which did not indicate any difiiculties with vascular access from the left radial. If he is having severe arm pain, this could be due to something else and he may want to be seen by his PCP or possibly the [facility] ER for imaging studies.

## 2017-09-06 NOTE — Progress Notes (Deleted)
Cardiology Office Note  Date: 09/06/2017   ID: Garan, Frappier [birth year], MRN [id number]  PCP: Deloria Lair., MD  Primary Cardiologist: Rozann Lesches, MD   No chief complaint on file.   History of Present Illness: [name] [name] is a 61 y.o. male last seen in early October following cardiac catheterization.  Cardiac catheterization performed on October 1 by Dr. Saunders Revel with finding of patent bypass grafts including LIMA to LAD, SVG to PDA, and free radial to OM1 with only 40% ostial stenosis noted. He did have progressive distal LAD disease and mildly elevated left ventricular filling pressures, with recommendation for medical therapy and no specific revascularization options defined.  Chest CT done in October revealed no evidence of acute cardiopulmonary disease, no pulmonary embolus or aortic dissection.  Past Medical History:  Diagnosis Date  . Anxiety   . Arthritis   . Asthma   . Cataract    Right eye  . CHF (congestive heart failure) ([place])   . Chronic lower back pain   . Coronary atherosclerosis of native coronary artery    a. LAD & OM stenting;  b. 2007 Cath: nonobs dzs, patent stents;  c. 07/2012 neg MV, EF 61%.  d. 11/20/14: [place] s/p  PCI w/ DES to pLAD and DES to OM1   . Essential hypertension, benign   . GERD (gastroesophageal reflux disease)   . Hearing loss of left ear   . History of gout   . History of hiatal hernia   . Hypercholesteremia   . Lumbar herniated disc   . MI, old   . Migraine   . OSA on CPAP   . 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 Thrombocytopenia ([place])   . TIA (transient ischemic attack) ~ 2010  . Type 2 diabetes mellitus ([place])     Past Surgical History:  Procedure Laterality Date  . ANTERIOR CERVICAL DECOMP/DISCECTOMY FUSION  1998   "C3-4"  . CARDIAC CATHETERIZATION  "several"  . CARDIAC CATHETERIZATION N/A 12/30/2015   Procedure: Left Heart Cath and Coronary  Angiography;  Surgeon: [name], MD;  Location: [place] CV LAB;  Service: Cardiovascular;  Laterality: N/A;  . CARPAL TUNNEL RELEASE Bilateral 2005  . COLONOSCOPY    . CORONARY ANGIOPLASTY    . CORONARY ANGIOPLASTY WITH STENT PLACEMENT  2002; 2003; 11/20/2014   "I have 4 stents after today" (11/20/2014)  . CORONARY ARTERY BYPASS GRAFT N/A 01/09/2016   Procedure: CORONARY ARTERY BYPASS GRAFTING (CABG) X 3 UTILIZING RIGHT AND LEFT INTERNAL MAMMARY ARTERY AND ENDOSCOPICALLY HARVESTED SAPHENEOUS VEIN.;  Surgeon: Rexene Alberts, MD;  Location: [place];  Service: Open Heart Surgery;  Laterality: N/A;  . ESOPHAGOGASTRODUODENOSCOPY    . KNEE SURGERY Left 02/2012   "scraped; open"  . LEFT HEART CATH AND CORS/GRAFTS ANGIOGRAPHY N/A 08/09/2017   Procedure: LEFT HEART CATH AND CORS/GRAFTS ANGIOGRAPHY;  Surgeon: [name], MD;  Location: [place] CV LAB;  Service: Cardiovascular;  Laterality: N/A;  . LEFT HEART CATHETERIZATION WITH CORONARY ANGIOGRAM N/A 07/20/2012   Procedure: LEFT HEART CATHETERIZATION WITH CORONARY ANGIOGRAM;  Surgeon: [name], MD;  Location: [place] CATH LAB;  Service: Cardiovascular;  Laterality: N/A;  . LEFT HEART CATHETERIZATION WITH CORONARY ANGIOGRAM N/A 11/20/2014   Procedure: LEFT HEART CATHETERIZATION WITH CORONARY ANGIOGRAM;  Surgeon: [name], MD;  Location: [facility] CATH LAB;  Service: Cardiovascular;  Laterality:  N/A;  . LEFT HEART CATHETERIZATION WITH CORONARY ANGIOGRAM N/A 11/26/2014   Procedure: LEFT HEART CATHETERIZATION WITH CORONARY ANGIOGRAM;  Surgeon: [name], MD;  Location: [facility] CATH LAB;  Service: Cardiovascular;  Laterality: N/A;  . NEUROPLASTY / TRANSPOSITION ULNAR NERVE AT ELBOW Right ~ 2012  . PERCUTANEOUS CORONARY ROTOBLATOR INTERVENTION (PCI-R)  11/20/2014   Procedure: PERCUTANEOUS CORONARY ROTOBLATOR INTERVENTION (PCI-R);  Surgeon: [name], MD;  Location: [facility] CATH LAB;  Service: Cardiovascular;;  . SHOULDER ARTHROSCOPY Left ~ 2011    . TEE WITHOUT CARDIOVERSION N/A 01/09/2016   Procedure: TRANSESOPHAGEAL ECHOCARDIOGRAM (TEE);  Surgeon: Rexene Alberts, MD;  Location: [place];  Service: Open Heart Surgery;  Laterality: N/A;    Current Outpatient Prescriptions  Medication Sig Dispense Refill  . acetaminophen (TYLENOL) 500 MG tablet Take 1,000 mg by mouth every 6 (six) hours as needed (pain).    [name] albuterol (PROVENTIL HFA;VENTOLIN HFA) 108 (90 BASE) MCG/ACT inhaler Inhale 2 puffs into the lungs every 6 (six) hours as needed for wheezing or shortness of breath.    [name] albuterol (PROVENTIL) (2.5 MG/3ML) 0.083% nebulizer solution Take 2.5 mg by nebulization every 6 (six) hours as needed for wheezing or shortness of breath.    [name] amLODipine (NORVASC) 2.5 MG tablet Take 1 tablet (2.5 mg total) by mouth daily. 30 tablet 0  . aspirin EC 81 MG tablet Take 81 mg by mouth at bedtime.     . citalopram (CELEXA) 20 MG tablet Take 20 mg by mouth at bedtime.     . clopidogrel (PLAVIX) 75 MG tablet Take 1 tablet (75 mg total) by mouth daily. 30 tablet 1  . fluticasone (FLONASE) 50 MCG/ACT nasal spray Place 1 spray into the nose 2 (two) times daily.     . Fluticasone-Salmeterol (ADVAIR) 250-50 MCG/DOSE AEPB Inhale 1 puff into the lungs every 12 (twelve) hours.    . gabapentin (NEURONTIN) 300 MG capsule Take 300 mg by mouth 3 (three) times daily.  2  . insulin aspart (NOVOLOG FLEXPEN) 100 UNIT/ML FlexPen Inject 1-15 Units into the skin 3 (three) times daily as needed for high blood sugar (CBG >120). Per sliding scale written down at home    . Insulin Degludec (TRESIBA FLEXTOUCH) 200 UNIT/ML SOPN Inject 60-70 Units into the skin 2 (two) times daily. 70 units in the morning and 60 units at bedtime    . Melatonin 5 MG TABS Take 10 mg by mouth at bedtime.     . metFORMIN (GLUCOPHAGE) 1000 MG tablet Take 1 tablet (1,000 mg total) by mouth 2 (two) times daily. 60 tablet 11  . metoprolol tartrate (LOPRESSOR) 25 MG tablet Take 25 mg by mouth 2 (two) times  daily.    . nitroGLYCERIN (NITROSTAT) 0.4 MG SL tablet Place 1 tablet (0.4 mg total) under the tongue every 5 (five) minutes as needed for chest pain. 25 tablet 3  . omeprazole (PRILOSEC) 20 MG capsule Take 20 mg by mouth 2 (two) times daily before a meal.    . pravastatin (PRAVACHOL) 20 MG tablet Take 20 mg by mouth daily.    [name] RANEXA 500 MG 12 hr tablet Take 1,000 mg by mouth 2 (two) times daily.    [name] topiramate (TOPAMAX) 25 MG capsule Take 25 mg by mouth 2 (two) times daily.     No current facility-administered medications for this visit.    Allergies:  Imdur [isosorbide dinitrate]; Statins; Tramadol; Tricor [fenofibrate]; and Valproic acid   Social History: The patient  reports  that he has never smoked. He has never used smokeless tobacco. He reports that he does not drink alcohol or use drugs.   Family History: The patient's family history includes Coronary artery disease (age of onset: 66) in his father.   ROS:  Please see the history of present illness. Otherwise, complete review of systems is positive for {NONE DEFAULTED:18576::"none"}.  All other systems are reviewed and negative.   Physical Exam: VS:  There were no vitals taken for this visit., BMI There is no height or weight on file to calculate BMI.  Wt Readings from Last 3 Encounters:  08/13/17 234 lb (106.1 kg)  08/12/17 232 lb (105.2 kg)  08/11/17 232 lb 1.6 oz (105.3 kg)    General: Patient appears comfortable at rest. HEENT: Conjunctiva and lids normal, oropharynx clear with moist mucosa. Neck: Supple, no elevated JVP or carotid bruits, no thyromegaly. Lungs: Clear to auscultation, nonlabored breathing at rest. Cardiac: Regular rate and rhythm, no S3 or significant systolic murmur, no pericardial rub. Abdomen: Soft, nontender, no hepatomegaly, bowel sounds present, no guarding or rebound. Extremities: No pitting edema, distal pulses 2+. Skin: Warm and dry. Musculoskeletal: No kyphosis. Neuropsychiatric: Alert and  oriented x3, affect grossly appropriate.  ECG: I personally reviewed the tracing from 08/13/2017 which showed normal sinus rhythm.  Recent Labwork: 08/10/2017: TSH 1.280 08/11/2017: ALT 27; AST 24 08/12/2017: BUN 19; Creatinine, Ser 1.04; Hemoglobin 14.6; Platelets 105; Potassium 4.4; Sodium 132     Component Value Date/Time   CHOL 174 08/10/2017 1450   TRIG 245 (H) 08/10/2017 1450   HDL 27 (L) 08/10/2017 1450   CHOLHDL 6.4 08/10/2017 1450   VLDL 49 (H) 08/10/2017 1450   LDLCALC 98 08/10/2017 1450    Other Studies Reviewed Today:  Chest CT 08/13/2017: IMPRESSION: No evidence of pulmonary embolism.  No evidence of acute cardiopulmonary disease.  Aortic Atherosclerosis (ICD10-I70.0).  Assessment and Plan:   Current medicines were reviewed with the patient today.  No orders of the defined types were placed in this encounter.   Disposition:  Signed, [name], MD, [facility] 09/06/2017 8:33 AM    [facility] [place]. [address], [place], [place] [address] Phone: [phone]; Fax: [phone]

## 2017-09-07 ENCOUNTER — Encounter: Payer: Self-pay | Admitting: Cardiology

## 2017-09-07 ENCOUNTER — Ambulatory Visit: Payer: Medicare Other | Admitting: Cardiology

## 2018-03-04 ENCOUNTER — Other Ambulatory Visit: Payer: Self-pay | Admitting: *Deleted

## 2018-03-04 MED ORDER — PRAVASTATIN SODIUM 20 MG PO TABS: 20.0000 mg | ORAL_TABLET | Freq: Every day | ORAL | 0 refills | Status: DC

## 2018-05-30 ENCOUNTER — Other Ambulatory Visit: Payer: Self-pay | Admitting: *Deleted

## 2018-05-30 MED ORDER — PRAVASTATIN SODIUM 20 MG PO TABS: 20.0000 mg | ORAL_TABLET | Freq: Every day | ORAL | 0 refills | Status: DC

## 2018-07-08 ENCOUNTER — Inpatient Hospital Stay (HOSPITAL_COMMUNITY)
Admission: AD | Admit: 2018-07-08 | Discharge: 2018-07-10 | DRG: 065 | Disposition: A | Discharge status: Home Health | Payer: Medicare Other | Source: Other Acute Inpatient Hospital | Attending: Neurology | Admitting: Neurology

## 2018-07-08 ENCOUNTER — Other Ambulatory Visit: Payer: Self-pay

## 2018-07-08 ENCOUNTER — Encounter (HOSPITAL_COMMUNITY): Payer: Self-pay | Admitting: General Practice

## 2018-07-08 DIAGNOSIS — H538 Other visual disturbances: Secondary | ICD-10-CM | POA: Diagnosis present

## 2018-07-08 DIAGNOSIS — Z7982 Long term (current) use of aspirin: Secondary | ICD-10-CM | POA: Diagnosis not present

## 2018-07-08 DIAGNOSIS — J45909 Unspecified asthma, uncomplicated: Secondary | ICD-10-CM | POA: Diagnosis present

## 2018-07-08 DIAGNOSIS — E785 Hyperlipidemia, unspecified: Secondary | ICD-10-CM | POA: Diagnosis present

## 2018-07-08 DIAGNOSIS — I2510 Atherosclerotic heart disease of native coronary artery without angina pectoris: Secondary | ICD-10-CM | POA: Diagnosis present

## 2018-07-08 DIAGNOSIS — Z888 Allergy status to other drugs, medicaments and biological substances status: Secondary | ICD-10-CM

## 2018-07-08 DIAGNOSIS — Z9282 Status post administration of tPA (rtPA) in a different facility within the last 24 hours prior to admission to current facility: Secondary | ICD-10-CM | POA: Diagnosis not present

## 2018-07-08 DIAGNOSIS — G4733 Obstructive sleep apnea (adult) (pediatric): Secondary | ICD-10-CM | POA: Diagnosis present

## 2018-07-08 DIAGNOSIS — Z951 Presence of aortocoronary bypass graft: Secondary | ICD-10-CM

## 2018-07-08 DIAGNOSIS — R4701 Aphasia: Secondary | ICD-10-CM | POA: Diagnosis present

## 2018-07-08 DIAGNOSIS — Z9989 Dependence on other enabling machines and devices: Secondary | ICD-10-CM

## 2018-07-08 DIAGNOSIS — K219 Gastro-esophageal reflux disease without esophagitis: Secondary | ICD-10-CM | POA: Diagnosis present

## 2018-07-08 DIAGNOSIS — I6330 Cerebral infarction due to thrombosis of unspecified cerebral artery: Principal | ICD-10-CM | POA: Diagnosis present

## 2018-07-08 DIAGNOSIS — I10 Essential (primary) hypertension: Secondary | ICD-10-CM | POA: Diagnosis present

## 2018-07-08 DIAGNOSIS — I639 Cerebral infarction, unspecified: Secondary | ICD-10-CM | POA: Diagnosis not present

## 2018-07-08 DIAGNOSIS — E669 Obesity, unspecified: Secondary | ICD-10-CM | POA: Diagnosis present

## 2018-07-08 DIAGNOSIS — I351 Nonrheumatic aortic (valve) insufficiency: Secondary | ICD-10-CM | POA: Diagnosis not present

## 2018-07-08 DIAGNOSIS — R29708 NIHSS score 8: Secondary | ICD-10-CM | POA: Diagnosis present

## 2018-07-08 DIAGNOSIS — Z7902 Long term (current) use of antithrombotics/antiplatelets: Secondary | ICD-10-CM | POA: Diagnosis not present

## 2018-07-08 DIAGNOSIS — H9192 Unspecified hearing loss, left ear: Secondary | ICD-10-CM | POA: Diagnosis present

## 2018-07-08 DIAGNOSIS — Z6835 Body mass index (BMI) 35.0-35.9, adult: Secondary | ICD-10-CM

## 2018-07-08 DIAGNOSIS — F419 Anxiety disorder, unspecified: Secondary | ICD-10-CM

## 2018-07-08 DIAGNOSIS — Z955 Presence of coronary angioplasty implant and graft: Secondary | ICD-10-CM

## 2018-07-08 DIAGNOSIS — I110 Hypertensive heart disease with heart failure: Secondary | ICD-10-CM | POA: Diagnosis present

## 2018-07-08 DIAGNOSIS — E119 Type 2 diabetes mellitus without complications: Secondary | ICD-10-CM | POA: Diagnosis present

## 2018-07-08 DIAGNOSIS — Z8679 Personal history of other diseases of the circulatory system: Secondary | ICD-10-CM | POA: Diagnosis not present

## 2018-07-08 DIAGNOSIS — R29700 NIHSS score 0: Secondary | ICD-10-CM | POA: Diagnosis present

## 2018-07-08 DIAGNOSIS — H532 Diplopia: Secondary | ICD-10-CM | POA: Diagnosis present

## 2018-07-08 DIAGNOSIS — I252 Old myocardial infarction: Secondary | ICD-10-CM

## 2018-07-08 DIAGNOSIS — G459 Transient cerebral ischemic attack, unspecified: Secondary | ICD-10-CM

## 2018-07-08 DIAGNOSIS — Z885 Allergy status to narcotic agent status: Secondary | ICD-10-CM

## 2018-07-08 DIAGNOSIS — Z981 Arthrodesis status: Secondary | ICD-10-CM

## 2018-07-08 LAB — GLUCOSE, CAPILLARY: Glucose-Capillary: 267 mg/dL — ABNORMAL HIGH (ref 70–99)

## 2018-07-08 LAB — MRSA PCR SCREENING: MRSA BY PCR: NEGATIVE

## 2018-07-08 MED ORDER — INSULIN ASPART 100 UNIT/ML ~~LOC~~ SOLN
0.0000 [IU] | Freq: Three times a day (TID) | SUBCUTANEOUS | Status: DC
  Administered 2018-07-09: 3 [IU] via SUBCUTANEOUS
  Administered 2018-07-09: 2 [IU] via SUBCUTANEOUS
  Administered 2018-07-10 (×2): 3 [IU] via SUBCUTANEOUS
  Administered 2018-07-10: 2 [IU] via SUBCUTANEOUS

## 2018-07-08 MED ORDER — ACETAMINOPHEN 160 MG/5ML PO SOLN: 650.0000 mg | ORAL | Status: DC | PRN

## 2018-07-08 MED ORDER — PANTOPRAZOLE SODIUM 40 MG PO TBEC
40.0000 mg | DELAYED_RELEASE_TABLET | Freq: Every day | ORAL | Status: DC
  Administered 2018-07-09 – 2018-07-10 (×2): 40 mg via ORAL
  Filled 2018-07-08 (×2): qty 1

## 2018-07-08 MED ORDER — STROKE: EARLY STAGES OF RECOVERY BOOK
Freq: Once | Status: DC
  Filled 2018-07-08: qty 1

## 2018-07-08 MED ORDER — ACETAMINOPHEN 650 MG RE SUPP: 650.0000 mg | RECTAL | Status: DC | PRN

## 2018-07-08 MED ORDER — INSULIN ASPART 100 UNIT/ML ~~LOC~~ SOLN
0.0000 [IU] | Freq: Every day | SUBCUTANEOUS | Status: DC
  Administered 2018-07-08: 3 [IU] via SUBCUTANEOUS

## 2018-07-08 MED ORDER — SENNOSIDES-DOCUSATE SODIUM 8.6-50 MG PO TABS: 1.0000 | ORAL_TABLET | Freq: Every evening | ORAL | Status: DC | PRN

## 2018-07-08 MED ORDER — ACETAMINOPHEN 325 MG PO TABS
650.0000 mg | ORAL_TABLET | ORAL | Status: DC | PRN
  Administered 2018-07-08 – 2018-07-09 (×3): 650 mg via ORAL
  Filled 2018-07-08 (×3): qty 2

## 2018-07-08 MED ORDER — SODIUM CHLORIDE 0.9 % IV SOLN
INTRAVENOUS | Status: DC
  Administered 2018-07-08: 18:00:00 via INTRAVENOUS

## 2018-07-08 NOTE — Progress Notes (Addendum)
   07/08/18 1715  Vitals  Temp (!) 96.8 F (36 C)  Temp Source Axillary  BP (!) 140/47  MAP (mmHg) 95  BP Location Right Arm  BP Method Automatic  Patient Position (if appropriate) Lying  Pulse Rate 68  Pulse Rate Source Monitor  ECG Heart Rate 66  Cardiac Rhythm NSR  Oxygen Therapy  SpO2 100 %  O2 Device Room Air  Pre-WUA / WUA Start  [place] Agitation Sedation Scale (RASS) 0  RASS Goal 0  Pain Assessment  Pain Scale 0-10  Pain Score 2  Pain Type Acute pain  Pain Location Head  Pain Descriptors / Indicators Headache  Pain Intervention(s) MD notified (Comment);RN made aware (pt reports H/A has improved since tPA was given)  Glasgow Coma Scale  Eye Opening 4  Best Verbal Response (NON-intubated) 5  Best Motor Response 6  Glasgow Coma Scale Score 15  Height and Weight  Height 5' 9"  (1.753 m)  Weight 109.2 kg  BSA (Calculated - sq m) 2.31 sq meters  BMI (Calculated) 35.54  Weight in (lb) to have BMI = 25 168.9  Provider Notification  Provider Name/Title Arrie Eastern, MD  Date Provider Notified 07/08/18  Time Provider Notified 1715  Notification Type Page  Notification Reason Other (Comment) (pt arrival via [facility])  Response Other (Comment) (MD at bedside)  Pt arrived to 4N22 at current time via [facility].  Per report from RN from [facility] in [place], [place] was given at 1400 exactly.  Pt A&O x 4, without distress.  Family en route, MD paged. See doc flowsheets for NIH/assessment.

## 2018-07-08 NOTE — H&P (Addendum)
Chief Complaint: s/p TPA for blurry vision /confusion   History obtained from:  Patient     HPI:                                                                                                                                         GEROLD SAR is an 62 y.o. male with PMH TIA, DM 2, HLD who presented to [facility] for sudden onset blurry vision and confusion.  Patient was out shopping when he went to use the credit card machine he suddenly could not remember how to use the machine. His vision was also blurry. Wife took patient to the hospital where tele-neurology was consulted. TPA bolus started  at 1400.  Patient transferred to [facility] for post TPA care. patient normally takes Aspirin 81 mg and plavix 75 mg. Denies HA, focal weakness, CP , SOB. Denies  Smoking, ETOH or drug abuse.   Outside Hospital : BG: 263 INR 1.1 CTA: calcified atherosclerotic disease at the carotid bifurcation w/o hemodynamically significant stenosis. No dissection or aneurysm. Intracranial arteries and veins are patent and unremarkable.  CT Head: no acute intracranial abnormality.   No prior stroke history noted. TIA 2010.  Date last known well: Date: 07/08/2018 Time last known well: Time: 11:56 tPA Given: Yes (TPA started at 1400)   Modified Rankin: Rankin Score=0  NIHSS:0 1a Level of Conscious:0 1b LOC Questions: 0 1c LOC Commands: 0 2 Best Gaze: 0 3 Visual: 0 4 Facial Palsy: 0 5a Motor Arm - left: 0 5b Motor Arm - Right: 0 6a Motor Leg - Left: 0 6b Motor Leg - Right: 0 7 Limb Ataxia: 0 8 Sensory: 0 9 Best Language: 0 10 Dysarthria:0 11 Extinct. and Inattention:0 TOTAL: 0   Past Medical History:  Diagnosis Date  . Anxiety   . Arthritis   . Asthma   . Cataract    Right eye  . CHF (congestive heart failure) ([place])   . Chronic lower back pain   . Coronary atherosclerosis of native coronary artery    a. LAD & OM stenting;  b. 2007 Cath: nonobs dzs, patent stents;  c. 07/2012 neg  MV, EF 61%.  d. 11/20/14: [place] s/p  PCI w/ DES to pLAD and DES to OM1   . Essential hypertension, benign   . GERD (gastroesophageal reflux disease)   . Hearing loss of left ear   . History of gout   . History of hiatal hernia   . Hypercholesteremia   . Lumbar herniated disc   . MI, old   . Migraine   . OSA on CPAP   . 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 Thrombocytopenia ([place])   . TIA (transient ischemic attack) ~ 2010  . Type 2 diabetes mellitus ([place])  Past Surgical History:  Procedure Laterality Date  . ANTERIOR CERVICAL DECOMP/DISCECTOMY FUSION  1998   "C3-4"  . CARDIAC CATHETERIZATION  "several"  . CARDIAC CATHETERIZATION N/A 12/30/2015   Procedure: Left Heart Cath and Coronary Angiography;  Surgeon: [name], MD;  Location: [place] CV LAB;  Service: Cardiovascular;  Laterality: N/A;  . CARPAL TUNNEL RELEASE Bilateral 2005  . COLONOSCOPY    . CORONARY ANGIOPLASTY    . CORONARY ANGIOPLASTY WITH STENT PLACEMENT  2002; 2003; 11/20/2014   "I have 4 stents after today" (11/20/2014)  . CORONARY ARTERY BYPASS GRAFT N/A 01/09/2016   Procedure: CORONARY ARTERY BYPASS GRAFTING (CABG) X 3 UTILIZING RIGHT AND LEFT INTERNAL MAMMARY ARTERY AND ENDOSCOPICALLY HARVESTED SAPHENEOUS VEIN.;  Surgeon: Rexene Alberts, MD;  Location: [place];  Service: Open Heart Surgery;  Laterality: N/A;  . ESOPHAGOGASTRODUODENOSCOPY    . KNEE SURGERY Left 02/2012   "scraped; open"  . LEFT HEART CATH AND CORS/GRAFTS ANGIOGRAPHY N/A 08/09/2017   Procedure: LEFT HEART CATH AND CORS/GRAFTS ANGIOGRAPHY;  Surgeon: [name], MD;  Location: [place] CV LAB;  Service: Cardiovascular;  Laterality: N/A;  . LEFT HEART CATHETERIZATION WITH CORONARY ANGIOGRAM N/A 07/20/2012   Procedure: LEFT HEART CATHETERIZATION WITH CORONARY ANGIOGRAM;  Surgeon: [name], MD;  Location: [place] CATH LAB;  Service: Cardiovascular;  Laterality: N/A;  .  LEFT HEART CATHETERIZATION WITH CORONARY ANGIOGRAM N/A 11/20/2014   Procedure: LEFT HEART CATHETERIZATION WITH CORONARY ANGIOGRAM;  Surgeon: [name], MD;  Location[facility] CATH LAB;  Service: Cardiovascular;  Laterality: N/A;  . LEFT HEART CATHETERIZATION WITH CORONARY ANGIOGRAM N/A 11/26/2014   Procedure: LEFT HEART CATHETERIZATION WITH CORONARY ANGIOGRAM;  Surgeon: [name], MD;  Location: [facility] CATH LAB;  Service: Cardiovascular;  Laterality: N/A;  . NEUROPLASTY / TRANSPOSITION ULNAR NERVE AT ELBOW Right ~ 2012  . PERCUTANEOUS CORONARY ROTOBLATOR INTERVENTION (PCI-R)  11/20/2014   Procedure: PERCUTANEOUS CORONARY ROTOBLATOR INTERVENTION (PCI-R);  Surgeon: [name], MD;  Location: [facility] CATH LAB;  Service: Cardiovascular;;  . SHOULDER ARTHROSCOPY Left ~ 2011  . TEE WITHOUT CARDIOVERSION N/A 01/09/2016   Procedure: TRANSESOPHAGEAL ECHOCARDIOGRAM (TEE);  Surgeon: Rexene Alberts, MD;  Location: [place];  Service: Open Heart Surgery;  Laterality: N/A;    Family History  Problem Relation Age of Onset  . Coronary artery disease Father 40         Social History:  reports that he has never smoked. He has never used smokeless tobacco. He reports that he does not drink alcohol or use drugs.  Allergies:  Allergies  Allergen Reactions  . Imdur [Isosorbide Dinitrate] Other (See Comments)    Severe headache  . Statins Other (See Comments)    Muscle aches and cramps  . Tramadol Other (See Comments)    Chest pain   . Tricor [Fenofibrate] Other (See Comments)    Leg cramps  . Valproic Acid Other (See Comments)    "anger"    Medications:  Scheduled: .  stroke: mapping our early stages of recovery book   Does not apply Once  . insulin aspart  0-5 Units Subcutaneous QHS  . [START ON 07/09/2018] insulin aspart  0-9 Units Subcutaneous TID WC  . pantoprazole  40 mg Oral Daily    Continuous: . sodium chloride     [id number] **OR** acetaminophen (TYLENOL) oral liquid 160 mg/5 mL **OR** acetaminophen, senna-docusate   ROS:                                                                                                                                       History obtained from the patient  General ROS: negative for - chills, fatigue, fever, night sweats, weight gain or weight loss Ophthalmic ROS: positive for - blurry vision Respiratory ROS: negative for - cough,  shortness of breath or wheezing Cardiovascular ROS: negative for - chest pain, dyspnea on exertion,  Musculoskeletal ROS: negative for - joint swelling or muscular weakness, BLE numbness below the knee at baseline. Neurological ROS: as noted in HPI   General Examination:                                                                                                      Blood pressure (!) 140/47, pulse 68, temperature (!) 96.8 F (36 C), temperature source Axillary, height 5' 9"  (1.753 m), weight 109.2 kg, SpO2 100 %.  HEENT-  Normocephalic, no lesions, without obvious abnormality.  Normal external eye and conjunctiva.  Cardiovascular- S1-S2 audible, pulses palpable throughout   Lungs-no rhonchi or wheezing noted, no excessive working breathing.  Saturations within normal limits on RA Abdomen- BS present all 4 quadrants Extremities- Warm, dry and intact Musculoskeletal-no joint tenderness, deformity or swelling Skin-warm and dry, sores on sole of right foot.  Neurological Examination Mental Status: Alert, oriented, thought content appropriate.  Speech fluent without evidence of aphasia.  Able to follow  commands without difficulty. Cranial Nerves: II:  Visual fields grossly normal,  III,IV, VI: ptosis not present, extra-ocular motions intact bilaterally, pupils equal, round, reactive to light and accommodation V,VII: smile symmetric, facial light touch sensation normal bilaterally VIII:  hearing normal bilaterally IX,X: uvula rises symmetrically XI: bilateral shoulder shrug XII: midline tongue extension Motor: Right : Upper extremity   5/5    Left:     Upper extremity   5/5  Lower extremity   5/5     Lower extremity   5/5 Tone and bulk:normal tone throughout; no atrophy noted Sensory:  light touch intact throughout, above the knee. BLE below the knee has numbness at baseline and right arm has numbness at baseline as well.  Deep Tendon Reflexes: 2+ and symmetric biceps and patella, diminished triceps Plantars: Right: downgoing   Left: downgoing Cerebellar: normal finger-to-nose, normal rapid alternating movements and normal heel-to-shin test Gait: deferred   Lab Results: Basic Metabolic Panel: No results for input(s): NA, K, CL, CO2, GLUCOSE, BUN, CREATININE, CALCIUM, MG, PHOS in the last 168 hours.  CBC: No results for input(s): WBC, NEUTROABS, HGB, HCT, MCV, PLT in the last 168 hours.  Lipid Panel: No results for input(s): CHOL, TRIG, HDL, CHOLHDL, VLDL, LDLCALC in the last 168 hours.  CBG: No results for input(s): GLUCAP in the last 168 hours.  Imaging: No results found.     Laurey Morale, MSN, NP-C [facility] [phone]  07/08/2018, 5:56 PM   Attending physician note to follow with Assessment and plan .   Assessment: 62 y.o. male with PMH TIA, DM 2, HLD who presented to [facility] for sudden onset blurry vision and confusion. CT head showed no acute abnormality. TPA was given at OSH initial NIH stroke scale of 8. Upon arrival to [facility] s/p TPA was NIHSS 0 back to baseline   Stroke Risk Factors - diabetes mellitus and hyperlipidemia Etiology : further stroke work-up needed    ##Acute ischemic stroke status post TPA --MRI Brain  --Echocardiogram -- ASA start tomorrow -- PT consult, OT consult, Speech consult --Telemetry monitoring --Frequent neuro checks --Stroke swallow screen    ##HTN: BP goal:  180/105  ##Coronary Artery Disease: S/p CABG and stent  Hold antiplatelets  ##Diabetes Mellitus: Sliding Scale Insulin  Hgb A1c  ##HLD Allergic to statins per patient/chart Fasting lipid panel  ##DVT prophylaxis: SCD's   --please page stroke NP  Or  PA  Or MD from 8am -4 pm  as this patient from this time will be  followed by the stroke.   You can look them up on [email or web address]  Password [email or web address]   NEUROHOSPITALIST ADDENDUM Performed a face to face diagnostic evaluation.  Impression: Patient presents with aphasia, right-sided weakness, visual blurring.  NIH 8 at outside hospital.  TPA administered the patient back to normal currently. Key exam findings: Normal neurological exam Plan: Work-up as detailed above.  Will need MRI, echocardiogram.  If everything is normal, consider EEG as well. I have reviewed the contents of history and physical exam as documented by PA/ARNP/Resident and agree with above documentation.  I have discussed and formulated the above plan as documented. Edits to the note have been made as needed.    Karena Addison Birgit Nowling MD [facility] [phone]   If 7pm to 7am, please call on call as listed on AMION.

## 2018-07-09 ENCOUNTER — Inpatient Hospital Stay (HOSPITAL_COMMUNITY): Payer: Medicare Other

## 2018-07-09 ENCOUNTER — Other Ambulatory Visit (HOSPITAL_COMMUNITY): Payer: Medicare Other

## 2018-07-09 DIAGNOSIS — I6330 Cerebral infarction due to thrombosis of unspecified cerebral artery: Secondary | ICD-10-CM

## 2018-07-09 DIAGNOSIS — I351 Nonrheumatic aortic (valve) insufficiency: Secondary | ICD-10-CM

## 2018-07-09 DIAGNOSIS — R4701 Aphasia: Secondary | ICD-10-CM

## 2018-07-09 LAB — LIPID PANEL
CHOL/HDL RATIO: 4.4 ratio
Cholesterol: 114 mg/dL (ref 0–200)
HDL: 26 mg/dL — ABNORMAL LOW (ref >40)
LDL Cholesterol: 38 mg/dL (ref 0–99)
Triglycerides: 250 mg/dL — ABNORMAL HIGH (ref <150)
VLDL: 50 mg/dL — ABNORMAL HIGH (ref 0–40)

## 2018-07-09 LAB — GLUCOSE, CAPILLARY
GLUCOSE-CAPILLARY: 128 mg/dL — AB (ref 70–99)
GLUCOSE-CAPILLARY: 162 mg/dL — AB (ref 70–99)
GLUCOSE-CAPILLARY: 218 mg/dL — AB (ref 70–99)
Glucose-Capillary: 199 mg/dL — ABNORMAL HIGH (ref 70–99)

## 2018-07-09 LAB — HEMOGLOBIN A1C
Hgb A1c MFr Bld: 7.3 % — ABNORMAL HIGH (ref 4.8–5.6)
Mean Plasma Glucose: 162.81 mg/dL

## 2018-07-09 LAB — ECHOCARDIOGRAM COMPLETE
HEIGHTINCHES: 69 in
Weight: 3851.88 oz

## 2018-07-09 MED ORDER — PERFLUTREN LIPID MICROSPHERE
1.0000 mL | INTRAVENOUS | Status: AC | PRN
  Administered 2018-07-09: 2 mL via INTRAVENOUS
  Filled 2018-07-09: qty 10

## 2018-07-09 MED ORDER — ASPIRIN EC 81 MG PO TBEC
81.0000 mg | DELAYED_RELEASE_TABLET | Freq: Every day | ORAL | Status: DC
  Administered 2018-07-09 – 2018-07-10 (×2): 81 mg via ORAL
  Filled 2018-07-09 (×2): qty 1

## 2018-07-09 NOTE — Progress Notes (Signed)
PT Cancellation Note  Patient Details Name: [name] [name] MRN: [id number] DOB: 05-05-1956   Cancelled Treatment:    Reason Eval/Treat Not Completed: Active bedrest order   Sandy Salaam [place] 07/09/2018, 6:59 AM  Elwyn Reach, PT Acute Rehabilitation Services Pager: [phone] Office: [phone]

## 2018-07-09 NOTE — Progress Notes (Signed)
STROKE TEAM PROGRESS NOTE   HISTORY OF PRESENT ILLNESS (per record) [name] [name] is an 62 y.o. male with PMH TIA, DM 2, HLD who presented to [place] for sudden onset blurry vision and confusion.  Patient was out shopping when he went to use the credit card machine he suddenly could not remember how to use the machine. His vision was also blurry. Wife took patient to the hospital where tele-neurology was consulted. TPA bolus started  at 1400.  Patient transferred to [facility] for post TPA care. patient normally takes Aspirin 81 mg and plavix 75 mg. Denies HA, focal weakness, CP , SOB. Denies  Smoking, ETOH or drug abuse.   Outside Hospital : BG: 263 INR 1.1 CTA: calcified atherosclerotic disease at the carotid bifurcation w/o hemodynamically significant stenosis. No dissection or aneurysm. Intracranial arteries and veins are patent and unremarkable.  CT Head: no acute intracranial abnormality.   No prior stroke history noted. TIA 2010.  Date last known well: Date: 07/08/2018 Time last known well: Time: 11:56 tPA Given: Yes (TPA started at 1400)  Modified Rankin: Rankin Score=0  NIHSS:0   SUBJECTIVE (INTERVAL HISTORY) The patient's wife was at the bedside.  She reiterated that the patient had experienced TIAs in the past.  Apparently he has never had a stroke prior to this.  He has been on aspirin and Plavix for coronary artery disease.  An MRI is pending.  We will add an MRA.his blood pressure is under good control. He feels that he  is almost back to his baseline.    OBJECTIVE Vitals:   07/09/18 0400 07/09/18 0500 07/09/18 0600 07/09/18 0700  BP: (!) 145/80 (!) 143/83 (!) 154/73 (!) 160/75  Pulse: 63 69 68 62  Resp: 15 14 16 13   Temp: 98.5 F (36.9 C)     TempSrc: Oral     SpO2: 99% 99% 98% 100%  Weight:      Height:        CBC: No results for input(s): WBC, NEUTROABS, HGB, HCT, MCV, PLT in the last 168 hours.  Basic Metabolic Panel: No results for  input(s): NA, K, CL, CO2, GLUCOSE, BUN, CREATININE, CALCIUM, MG, PHOS in the last 168 hours.  Lipid Panel:     Component Value Date/Time   CHOL 114 07/09/2018 0313   TRIG 250 (H) 07/09/2018 0313   HDL 26 (L) 07/09/2018 0313   CHOLHDL 4.4 07/09/2018 0313   VLDL 50 (H) 07/09/2018 0313   LDLCALC 38 07/09/2018 0313   HgbA1c:  Lab Results  Component Value Date   HGBA1C 7.3 (H) 07/09/2018   Urine Drug Screen:     Component Value Date/Time   LABOPIA NONE DETECTED 10/29/2015 1533   COCAINSCRNUR NONE DETECTED 10/29/2015 1533   LABBENZ NONE DETECTED 10/29/2015 1533   AMPHETMU NONE DETECTED 10/29/2015 1533   THCU NONE DETECTED 10/29/2015 1533   LABBARB NONE DETECTED 10/29/2015 1533    Alcohol Level     Component Value Date/Time   ETH <5 10/29/2015 1617    IMAGING   No results found.    MRI Brain Wo Contrast -no acute infarct MRA Brain - no large vessel stenosis   CT Head Wo Contrast - pending   Transthoracic Echocardiogram - pending 00/00/00        PHYSICAL EXAM Blood pressure (!) 160/75, pulse 62, temperature 98.5 F (36.9 C), temperature source Oral, resp. rate 13, height 5' 9"  (1.753 m), weight 109.2 kg, SpO2 100 %.  obese middle-age  Caucasian male currently not in distress. . Afebrile. Head is nontraumatic. Neck is supple without bruit.    Cardiac exam no murmur or gallop. Lungs are clear to auscultation. Distal pulses are well felt. Neurological Exam ;  Awake  Alert oriented x 3. Normal speech and language.eye movements full without nystagmus.fundi were not visualized. Vision acuity and fields appear normal. Hearing is normal. Palatal movements are normal. Face symmetric. Tongue midline. Normal strength, tone, reflexes and coordination. Normal sensation. Gait deferred.         ASSESSMENT/PLAN [name] is a 62 y.o. male with history of a previous TIA, coronary artery disease status post coronary artery bypass graft surgery, diabetes  mellitus, obstructive sleep apnea, migraines, hyperlipidemia, congestive heart failure, anxiety, and arthritis presenting with blurred vision and confusion.  TPA giveb at 1400 on 07/08/2018 at OSH. Stroke like episode s/p iv TPA   Resultant  No deficits  CT head - pending  MRI head - pending  MRA head - not performed  Carotid Doppler - pending  2D Echo - pending  CTA OSH - calcified atherosclerotic disease at the carotid bifurcation w/o hemodynamically significant stenosis. No dissection or aneurysm. Intracranial arteries and veins are patent and unremarkable.  LDL - 38  HgbA1c - 7.3  VTE prophylaxis - SCDs  Diet - carb modified with thin liquids.  aspirin 81 mg daily and clopidogrel 75 mg daily prior to admission, now on No antithrombotic  Patient counseled to be compliant with his antithrombotic medications  Ongoing aggressive stroke risk factor management  Therapy recommendations:  pending  Disposition:  Pending  Hypertension  Stable  Permissive hypertension SBP < 180 S/P TPA . Long-term BP goal normotensive  Hyperlipidemia  Lipid lowering medication PTA: Pravachol 20 mg daily  LDL 38, goal < 70  Current lipid lowering medication will resume pravachol  Continue statin at discharge  Diabetes  HgbA1c 7.3, goal < 7.0  Uncontrolled  Other Stroke Risk Factors  Advanced age  Obesity, Body mass index is 35.55 kg/m., recommend weight loss, diet and exercise as appropriate   Hx of TIA  Coronary artery disease  Migraines  Obstructive sleep apnea, on CPAP at home  Other Active Problems  Check labs in AM    Hospital day # 1 I have personally examined this patient, reviewed notes, independently viewed imaging studies, participated in medical decision making and plan of care.ROS completed by me personally and pertinent positives fully documented  I have made any additions or clarifications directly to the above note. he presented with some  confusion and speech difficulties and was treated with IV TPA and brain imaging does not show definite stroke. Possibilities include Complex partial seizures. Recommend check EEG. Continue close neurological monitoring and strict blood pressure control as per post TPA protocol. Long discussion at the bedside with the patient and his wife and answered questions about his care.This patient is critically ill and at significant risk of neurological worsening, death and care requires constant monitoring of vital signs, hemodynamics,respiratory and cardiac monitoring, extensive review of multiple databases, frequent neurological assessment, discussion with family, other specialists and medical decision making of high complexity.I have made any additions or clarifications directly to the above note.This critical care time does not reflect procedure time, or teaching time or supervisory time of PA/NP/Med Resident etc but could involve care discussion time.  I spent 30 minutes of neurocritical care time  in the care of  this patient.      Antony Contras, MD  Medical Director [facility] Pager: [phone] 07/09/2018 2:35 PM   To contact Stroke Continuity provider, please refer to [email or web address] After hours, contact General Neurology

## 2018-07-09 NOTE — Progress Notes (Signed)
Patient received from 4N ICU via wheelchair; patient is alert and oriented; patient oriented to room and unit routine; family at bedside.

## 2018-07-09 NOTE — Progress Notes (Signed)
  Echocardiogram 2D Echocardiogram has been performed.  Amarri Michaelson G Olyver Hawes 07/09/2018, 4:09 PM

## 2018-07-09 NOTE — Progress Notes (Signed)
OT Cancellation Note  Patient Details Name: MAYAN KLOEPFER MRN: [id number] DOB: [birth year]   Cancelled Treatment:    Reason Eval/Treat Not Completed: Patient not medically ready;Active bedrest order. Will return as schedule allows. Thank you.  [place], OTR/L Acute Rehab Pager: [phone] Office: [phone] 07/09/2018, 7:21 AM

## 2018-07-10 ENCOUNTER — Inpatient Hospital Stay (HOSPITAL_COMMUNITY): Payer: Medicare Other

## 2018-07-10 DIAGNOSIS — R4701 Aphasia: Secondary | ICD-10-CM

## 2018-07-10 LAB — GLUCOSE, CAPILLARY
GLUCOSE-CAPILLARY: 158 mg/dL — AB (ref 70–99)
GLUCOSE-CAPILLARY: 250 mg/dL — AB (ref 70–99)
Glucose-Capillary: 218 mg/dL — ABNORMAL HIGH (ref 70–99)

## 2018-07-10 LAB — BASIC METABOLIC PANEL
Anion gap: 9 (ref 5–15)
BUN: 14 mg/dL (ref 6–20)
CALCIUM: 9.4 mg/dL (ref 8.9–10.3)
CO2: 21 mmol/L — ABNORMAL LOW (ref 22–32)
Chloride: 108 mmol/L (ref 98–111)
Creatinine, Ser: 0.86 mg/dL (ref 0.61–1.24)
GFR calc non Af Amer: >60 mL/min (ref >60)
GLUCOSE: 159 mg/dL — AB (ref 70–99)
POTASSIUM: 3.8 mmol/L (ref 3.5–5.1)
SODIUM: 138 mmol/L (ref 135–145)

## 2018-07-10 LAB — CBC
HCT: 43.5 % (ref 39.0–52.0)
Hemoglobin: 15.2 g/dL (ref 13.0–17.0)
MCH: 31.1 pg (ref 26.0–34.0)
MCHC: 34.9 g/dL (ref 30.0–36.0)
MCV: 89 fL (ref 78.0–100.0)
Platelets: 92 10*3/uL — ABNORMAL LOW (ref 150–400)
RBC: 4.89 MIL/uL (ref 4.22–5.81)
RDW: 12.9 % (ref 11.5–15.5)
WBC: 3.3 10*3/uL — ABNORMAL LOW (ref 4.0–10.5)

## 2018-07-10 NOTE — Progress Notes (Signed)
SLP Cancellation Note  Patient Details Name: [name] [name] MRN: [id number] DOB: [birth year]   Cancelled treatment:       Reason Eval/Treat Not Completed: Patient at procedure or test/unavailable.   Deneise Lever, [place], CCC-SLP Speech-Language Pathologist Acute Rehabilitation Services Pager: [phone] Office: [phone]    Aliene Altes 07/10/2018, 1:35 PM

## 2018-07-10 NOTE — Discharge Instructions (Signed)
1. Gradually increase activity as tolerated 2. Outpatient physical and occupational therapy will be arranged. 3. No driving for two weeks. Be sure to have someone with you when you start driving again. 4. Check with your primary care provider about your glucose control. 5. Aspirin and Plavix for 3 weeks then aspirin 81 mg daily without Plavix. (stop Plavix in 3 weeks)

## 2018-07-10 NOTE — Progress Notes (Signed)
Preliminary notes--Bilateral carotid duplex exam completed. Bilateral ICAs 1-39% stenosis based on the category of velocity, maybe underestimate due to proximal obstruction.  Right CCA:  2.79x0.38x0.40cm heterogenous plaque seen extended in the pro-mid segments . Bilateral vertebral arteries demonstrate antegrade flow. Right vertebral artery demonstrates high resistant flow.  [place] Emilea Goga (RDMS RVT) 07/10/18 2:22 PM

## 2018-07-10 NOTE — Evaluation (Signed)
Occupational Therapy Evaluation Patient Details Name: PINCHAS REITHER MRN: [id number] DOB: July 10, 1956 Today's Date: 07/10/2018    History of Present Illness Mr. YASIEL GOYNE is a 62 y.o. male with history of a previous TIA, coronary artery disease status post coronary artery bypass graft surgery, diabetes mellitus, obstructive sleep apnea, migraines, hyperlipidemia, congestive heart failure, anxiety, and arthritis presenting with blurred vision and confusion.   Clinical Impression   Pt admitted with above. He demonstrates the below listed deficits and will benefit from continued OT to maximize safety and independence with BADLs.  Pt presents to OT with impaired visual accommodation, diplopia with Rt gaze and near vision (when reading).  Cover/uncover test reveals decreased alignment Rt eye.  He also demonstrates mild balance deficits.  He currently requires supervision for ADLs, but anticipate he will quickly progress to mod I.  He lives with wife, was fully independent with ADLs and IADLs except driving.  He is disabled due to back injury from [place].   Recommend OPOT (family prefers [facility]).        Follow Up Recommendations  Outpatient OT;Supervision - Intermittent - prefers [facility] Recommendations  None recommended by OT    Recommendations for Other Services       Precautions / Restrictions Precautions Precautions: None      Mobility Bed Mobility Overal bed mobility: Independent                Transfers Overall transfer level: Needs assistance Equipment used: None Transfers: Sit to/from [facility] Sit to Stand: Supervision Stand pivot transfers: Supervision       General transfer comment: mildly unsteady.  Pt reports feeling unsteady and "funny" when vision challenged when he is in standing     Balance Overall balance assessment: Mild deficits observed, not formally tested                                         ADL either performed or assessed with clinical judgement   ADL Overall ADL's : Needs assistance/impaired Eating/Feeding: Independent   Grooming: Wash/dry hands;Wash/dry face;Oral care;Supervision/safety;Standing   Upper Body Bathing: Supervision/ safety;Set up;Sitting   Lower Body Bathing: Supervison/ safety;Sit to/from stand   Upper Body Dressing : Set up;Supervision/safety;Sitting   Lower Body Dressing: Supervision/safety;Sit to/from stand   Toilet Transfer: Supervision/safety;Ambulation;Comfort height toilet   Toileting- Clothing Manipulation and Hygiene: Supervision/safety;Sit to/from stand       Functional mobility during ADLs: Supervision/safety       Vision Baseline Vision/History: Wears glasses Wears Glasses: Reading only Patient Visual Report: Blurring of vision Vision Assessment?: Yes Eye Alignment: Impaired (comment) Ocular Range of Motion: Within Functional Limits Alignment/Gaze Preference: Within Defined Limits Tracking/Visual Pursuits: Able to track stimulus in all quads without difficulty Saccades: Impaired - to be further tested in functional context Convergence: Impaired (comment) Visual Fields: No apparent deficits Diplopia Assessment: Only with right gaze Additional Comments: Pt reports diplopia with Rt gaze.   He does well intially with saccades, but then struggles, and complains of dizziness and blurry vision.   Cover and uncover test reveal deviation of Rt eye.  Convergence to ~17"-18" from nose.  He reports he noticed that he had to close his left eye when reading last night.  Upon questioning, he reports he has not reported diplopia to his MDs      Perception Perception Perception Tested?: Yes  Praxis Praxis Praxis tested?: Within functional limits    Pertinent Vitals/Pain Pain Assessment: No/denies pain     Hand Dominance Right   Extremity/Trunk Assessment Upper Extremity Assessment Upper Extremity Assessment: Overall WFL for tasks  assessed   Lower Extremity Assessment Lower Extremity Assessment: Defer to PT evaluation   Cervical / Trunk Assessment Cervical / Trunk Assessment: Normal   Communication Communication Communication: No difficulties   Cognition Arousal/Alertness: Awake/alert Behavior During Therapy: WFL for tasks assessed/performed Overall Cognitive Status: Within Functional Limits for tasks assessed                                 General Comments: WFL for basic tasks.  Uses humor appropriately.  Would benefit from further assessment    General Comments  wife present, and works during the day.  She can arrange transport to OP therapies     Exercises     Shoulder Instructions      Home Living Family/patient expects to be discharged to:: Private residence Living Arrangements: Spouse/significant other Available Help at Discharge: Family;Available PRN/intermittently Type of Home: House Home Access: Stairs to enter Entrance Stairs-Number of Steps: 2   Home Layout: Two level;Laundry or work area in basement     [employer] Shower/Tub: Teacher, early years/pre: Standard     Home Equipment: Civil engineer, contracting          Prior Functioning/Environment Level of Independence: Independent        Comments: Pt was fully independent with ADLs and IADLs including driving.   He does not do yard work or heavy, strenuous [place].  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          OT Problem List: Decreased activity tolerance;Impaired balance (sitting and/or standing);Impaired vision/perception      OT Treatment/Interventions: Self-care/ADL training;DME and/or AE instruction;Therapeutic activities;Visual/perceptual remediation/compensation;Patient/family education;Balance training    OT Goals(Current goals can be found in the care plan section) Acute Rehab OT Goals Patient Stated Goal: To get back to normal OT Goal  Formulation: With patient/family Time For Goal Achievement: 07/17/18 Potential to Achieve Goals: Good ADL Goals Pt Will Perform Grooming: with modified independence;standing Pt Will Perform Upper Body Bathing: with modified independence;standing Pt Will Perform Lower Body Bathing: with modified independence;sit to/from stand Pt Will Perform Upper Body Dressing: with modified independence;sitting Pt Will Perform Lower Body Dressing: with modified independence;sit to/from stand Pt Will Transfer to Toilet: with modified independence;ambulating;regular height toilet Pt Will Perform Toileting - Clothing Manipulation and hygiene: with modified independence;sit to/from stand Pt Will Perform Tub/Shower Transfer: Tub transfer;with supervision Additional ADL Goal #1: Pt will be independent with HEP and compensatory strategies for vision  OT Frequency: Min 2X/week   Barriers to D/C:            Co-evaluation              AM-PAC PT "6 Clicks" Daily Activity     Outcome Measure Help from another person eating meals?: None Help from another person taking care of personal grooming?: A Little Help from another person toileting, which includes using toliet, bedpan, or urinal?: A Little Help from another person bathing (including washing, rinsing, drying)?: A Little Help from another person to put on and taking off regular upper body clothing?: A Little Help from another person to put on and taking off regular lower body clothing?: A  Little 6 Click Score: 19   End of Session Equipment Utilized During Treatment: Gait belt Nurse Communication: Mobility status  Activity Tolerance: Patient tolerated treatment well Patient left: in chair;with call bell/phone within reach;with family/visitor present  OT Visit Diagnosis: Unsteadiness on feet (R26.81);Low vision, both eyes (H54.2)                Time: 6751-9824 OT Time Calculation (min): 38 min Charges:  OT General Charges $OT Visit: 1 Visit OT  Evaluation $OT Eval Moderate Complexity: 1 Mod OT Treatments $Therapeutic Activity: 23-37 mins  Lucille Passy, OTR/L Acute Rehabilitation Services Pager [phone] Office [place], [place] 07/10/2018, 11:37 AM

## 2018-07-10 NOTE — Plan of Care (Signed)
Patient stable, discussed POC with patient and spouse, agreeable with plan, denies question/concerns at this time.

## 2018-07-10 NOTE — Progress Notes (Signed)
PT Cancellation Note  Patient Details Name: DIAGO HAIK MRN: [id number] DOB: [birth year]   Cancelled Treatment:    Reason Eval/Treat Not Completed: Active bedrest order   Will follow,   Roney [place], PT  Acute Rehabilitation Services Pager [phone] Office [phone]    [place] 07/10/2018, 7:37 AM

## 2018-07-10 NOTE — Discharge Summary (Addendum)
Stroke Discharge Summary  Patient ID: [name] [name]   MRN: [id number]      DOB: [birth year]  Date of Admission: 07/08/2018 Date of Discharge: 07/10/2018  Attending Physician:  Garvin Fila, MD, Stroke MD Consultant(s):    None  Patient's PCP:  Deloria Lair., MD  DISCHARGE DIAGNOSIS:  Stroke like episode s/p IV TPA  Active Problems:   Aphasia   Cerebral thrombosis with cerebral infarction   Past Medical History:  Diagnosis Date  . Anxiety   . Arthritis   . Asthma   . Cataract    Right eye  . CHF (congestive heart failure) ([place])   . Chronic lower back pain   . Coronary atherosclerosis of native coronary artery    a. LAD & OM stenting;  b. 2007 Cath: nonobs dzs, patent stents;  c. 07/2012 neg MV, EF 61%.  d. 11/20/14: [place] s/p  PCI w/ DES to pLAD and DES to OM1   . Essential hypertension, benign   . GERD (gastroesophageal reflux disease)   . Hearing loss of left ear   . History of gout   . History of hiatal hernia   . Hypercholesteremia   . Lumbar herniated disc   . MI, old   . Migraine   . OSA on CPAP   . 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 Thrombocytopenia ([place])   . TIA (transient ischemic attack) ~ 2010  . Type 2 diabetes mellitus ([place])    Past Surgical History:  Procedure Laterality Date  . ANTERIOR CERVICAL DECOMP/DISCECTOMY FUSION  1998   "C3-4"  . CARDIAC CATHETERIZATION  "several"  . CARDIAC CATHETERIZATION N/A 12/30/2015   Procedure: Left Heart Cath and Coronary Angiography;  Surgeon: [name], MD;  Location: [facility];  Service: Cardiovascular;  Laterality: N/A;  . CARPAL TUNNEL RELEASE Bilateral 2005  . COLONOSCOPY    . CORONARY ANGIOPLASTY    . CORONARY ANGIOPLASTY WITH STENT PLACEMENT  2002; 2003; 11/20/2014   "I have 4 stents after today" (11/20/2014)  . CORONARY ARTERY BYPASS GRAFT N/A 01/09/2016   Procedure: CORONARY ARTERY BYPASS GRAFTING (CABG) X 3 UTILIZING  RIGHT AND LEFT INTERNAL MAMMARY ARTERY AND ENDOSCOPICALLY HARVESTED SAPHENEOUS VEIN.;  Surgeon: Rexene Alberts, MD;  Location: [facility];  Service: Open Heart Surgery;  Laterality: N/A;  . ESOPHAGOGASTRODUODENOSCOPY    . KNEE SURGERY Left 02/2012   "scraped; open"  . LEFT HEART CATH AND CORS/GRAFTS ANGIOGRAPHY N/A 08/09/2017   Procedure: LEFT HEART CATH AND CORS/GRAFTS ANGIOGRAPHY;  Surgeon: [name], MD;  Location: [facility];  Service: Cardiovascular;  Laterality: N/A;  . LEFT HEART CATHETERIZATION WITH CORONARY ANGIOGRAM N/A 07/20/2012   Procedure: LEFT HEART CATHETERIZATION WITH CORONARY ANGIOGRAM;  Surgeon: [name], MD;  Location: [facility];  Service: Cardiovascular;  Laterality: N/A;  . LEFT HEART CATHETERIZATION WITH CORONARY ANGIOGRAM N/A 11/20/2014   Procedure: LEFT HEART CATHETERIZATION WITH CORONARY ANGIOGRAM;  Surgeon: [name], MD;  Location: [facility] CATH [facility];  Service: Cardiovascular;  Laterality: N/A;  . LEFT HEART CATHETERIZATION WITH CORONARY ANGIOGRAM N/A 11/26/2014   Procedure: LEFT HEART CATHETERIZATION WITH CORONARY ANGIOGRAM;  Surgeon: [name], MD;  Location: [facility];  Service: Cardiovascular;  Laterality: N/A;  . NEUROPLASTY / TRANSPOSITION ULNAR NERVE AT ELBOW Right ~ 2012  . PERCUTANEOUS CORONARY ROTOBLATOR INTERVENTION (PCI-R)  11/20/2014  Procedure: PERCUTANEOUS CORONARY ROTOBLATOR INTERVENTION (PCI-R);  Surgeon: [name], MD;  Location: [facility] CATH LAB;  Service: Cardiovascular;;  . SHOULDER ARTHROSCOPY Left ~ 2011  . TEE WITHOUT CARDIOVERSION N/A 01/09/2016   Procedure: TRANSESOPHAGEAL ECHOCARDIOGRAM (TEE);  Surgeon: Rexene Alberts, MD;  Location: [place];  Service: Open Heart Surgery;  Laterality: N/A;    Allergies as of 07/10/2018      Reactions   Divalproex Sodium Other (See Comments)   Causes anger   Imdur [isosorbide Dinitrate] Other (See Comments)   Severe headache (trying lower dose 07/05/18)   Statins Other (See Comments)    Muscle aches and cramps   Tramadol Other (See Comments)   Chest pain    Tricor [fenofibrate] Other (See Comments)   Leg cramps   Valproic Acid Other (See Comments)   "anger"      Medication List    TAKE these medications   acetaminophen 500 MG tablet Commonly known as:  TYLENOL Take 1,000 mg by mouth every 6 (six) hours as needed (pain).   albuterol 108 (90 Base) MCG/ACT inhaler Commonly known as:  PROVENTIL HFA;VENTOLIN HFA Inhale 2 puffs into the lungs every 6 (six) hours as needed for wheezing or shortness of breath.   amLODipine 5 MG tablet Commonly known as:  NORVASC Take 5 mg by mouth at bedtime. What changed:  Another medication with the same name was removed. Continue taking this medication, and follow the directions you see here.   aspirin EC 81 MG tablet Take 81 mg by mouth at bedtime.   cephALEXin 500 MG capsule Commonly known as:  KEFLEX Take 50 mg by mouth 2 (two) times daily.   cetirizine 10 MG tablet Commonly known as:  ZYRTEC Take 10 mg by mouth at bedtime.   citalopram 20 MG tablet Commonly known as:  CELEXA Take 20 mg by mouth at bedtime.   clopidogrel 75 MG tablet Commonly known as:  PLAVIX Take 1 tablet (75 mg total) by mouth daily. What changed:  when to take this   ezetimibe 10 MG tablet Commonly known as:  ZETIA Take 10 mg by mouth at bedtime.   fluticasone 50 MCG/ACT nasal spray Commonly known as:  FLONASE Place 1 spray into the nose 2 (two) times daily.   Fluticasone-Salmeterol 250-50 MCG/DOSE Aepb Commonly known as:  ADVAIR Inhale 1 puff into the lungs every 12 (twelve) hours.   gabapentin 300 MG capsule Commonly known as:  NEURONTIN Take 300 mg by mouth 3 (three) times daily.   isosorbide mononitrate 30 MG 24 hr tablet Commonly known as:  IMDUR Take 30 mg by mouth at bedtime.   Melatonin 5 MG Tabs Take 10 mg by mouth at bedtime.   metFORMIN 1000 MG tablet Commonly known as:  GLUCOPHAGE Take 1 tablet (1,000 mg total) by  mouth 2 (two) times daily.   metoprolol tartrate 50 MG tablet Commonly known as:  LOPRESSOR Take 50 mg by mouth at bedtime.   nitroGLYCERIN 0.4 MG SL tablet Commonly known as:  NITROSTAT Place 1 tablet (0.4 mg total) under the tongue every 5 (five) minutes as needed for chest pain.   NOVOLOG FLEXPEN 100 UNIT/ML FlexPen Generic drug:  insulin aspart Inject 2-30 Units into the skin 3 (three) times daily as needed for high blood sugar (CBG >150). Per sliding scale written down at home   omeprazole 20 MG capsule Commonly known as:  PRILOSEC Take 20 mg by mouth 2 (two) times daily before a meal.   pravastatin  20 MG tablet Commonly known as:  PRAVACHOL Take 1 tablet (20 mg total) by mouth daily.   topiramate 25 MG capsule Commonly known as:  TOPAMAX Take 25 mg by mouth 2 (two) times daily.   TRESIBA FLEXTOUCH 200 UNIT/ML Sopn Generic drug:  Insulin Degludec Inject 60-70 Units into the skin See admin instructions. Inject 70 units subcutaneously every morning and 60 units at bedtime       LABORATORY STUDIES CBC    Component Value Date/Time   WBC 3.3 (L) 07/10/2018 0605   RBC 4.89 07/10/2018 0605   HGB 15.2 07/10/2018 0605   HCT 43.5 07/10/2018 0605   PLT 92 (L) 07/10/2018 0605   MCV 89.0 07/10/2018 0605   MCH 31.1 07/10/2018 0605   MCHC 34.9 07/10/2018 0605   RDW 12.9 07/10/2018 0605   LYMPHSABS 1.2 04/18/2017 1542   MONOABS 0.2 04/18/2017 1542   EOSABS 0.1 04/18/2017 1542   BASOSABS 0.0 04/18/2017 1542   CMP    Component Value Date/Time   NA 138 07/10/2018 0605   K 3.8 07/10/2018 0605   CL 108 07/10/2018 0605   CO2 21 (L) 07/10/2018 0605   GLUCOSE 159 (H) 07/10/2018 0605   BUN 14 07/10/2018 0605   CREATININE 0.86 07/10/2018 0605   CALCIUM 9.4 07/10/2018 0605   PROT 6.6 08/11/2017 0155   ALBUMIN 3.5 08/11/2017 0155   AST 24 08/11/2017 0155   ALT 27 08/11/2017 0155   ALKPHOS 35 (L) 08/11/2017 0155   BILITOT 0.5 08/11/2017 0155   GFRNONAA >60 07/10/2018 0605    GFRAA >60 07/10/2018 0605   COAGS Lab Results  Component Value Date   INR 1.11 08/09/2017   INR 1.38 01/09/2016   INR 1.16 01/07/2016   Lipid Panel    Component Value Date/Time   CHOL 114 02-Aug-2018 0313   TRIG 250 (H) August 02, 2018 0313   HDL 26 (L) 08-02-2018 0313   CHOLHDL 4.4 2018/08/02 0313   VLDL 50 (H) August 02, 2018 0313   LDLCALC 38 2018-08-02 0313   HgbA1C  Lab Results  Component Value Date   HGBA1C 7.3 (H) 08-02-2018   Urinalysis    Component Value Date/Time   COLORURINE YELLOW 08/12/2017 1640   APPEARANCEUR CLEAR 08/12/2017 1640   LABSPEC 1.019 08/12/2017 1640   PHURINE 5.0 08/12/2017 1640   GLUCOSEU 50 (A) 08/12/2017 1640   HGBUR NEGATIVE 08/12/2017 1640   BILIRUBINUR NEGATIVE 08/12/2017 1640   KETONESUR NEGATIVE 08/12/2017 1640   PROTEINUR NEGATIVE 08/12/2017 1640   UROBILINOGEN 0.2 12/04/2013 0730   NITRITE NEGATIVE 08/12/2017 1640   LEUKOCYTESUR NEGATIVE 08/12/2017 1640   Urine Drug Screen     Component Value Date/Time   LABOPIA NONE DETECTED 10/29/2015 1533   COCAINSCRNUR NONE DETECTED 10/29/2015 1533   LABBENZ NONE DETECTED 10/29/2015 1533   AMPHETMU NONE DETECTED 10/29/2015 1533   THCU NONE DETECTED 10/29/2015 1533   LABBARB NONE DETECTED 10/29/2015 1533    Alcohol Level    Component Value Date/Time   [facility] <5 10/29/2015 1617     SIGNIFICANT DIAGNOSTIC STUDIES  Mr Jodene Nam Head Wo Contrast 02-Aug-2018 IMPRESSION:  Negative for acute infarct or hemorrhage. Mild chronic microvascular ischemia in the white matter Negative MRA head.   Studies From Outside Hospital  CTA: calcified atherosclerotic disease at the carotid bifurcation w/o hemodynamically significant stenosis. No dissection or aneurysm. Intracranial arteries and veins are patent and unremarkable.   CT Head: no acute intracranial abnormality.    Transthoracic Echocardiogram  08-02-18 Study Conclusions - Left ventricle: The  cavity size was normal. Wall thickness was   increased  in a pattern of mild LVH. Systolic function was normal.   The estimated ejection fraction was in the range of 60% to 65%.   Wall motion was normal; there were no regional wall motion   abnormalities. Left ventricular diastolic function parameters   were normal for the patient&'s age. - Aortic valve: Mildly calcified annulus. Trileaflet; mildly   calcified leaflets. There was mild regurgitation. - Mitral valve: There was trivial regurgitation. - Atrial septum: No defect or patent foramen ovale was identified. - Tricuspid valve: There was trivial regurgitation. - Pericardium, extracardiac: There was no pericardial effusion.    Bilateral Carotid Dopplers  07/10/2018 Preliminary notes-- Bilateral carotid duplex exam completed.  Bilateral ICAs 1-39% stenosis based on the category of velocity, maybe underestimate due to proximal obstruction.   Right CCA:  2.79x0.38x0.40cm heterogenous plaque seen extended in the pro-mid segments .  Bilateral vertebral arteries demonstrate antegrade flow.  Right vertebral artery demonstrates high resistant flow.    EEG 07/10/2018 Impression The EEG isabnormalare findings are suggestive of mild generalized cerebral dysfunction.Epileptiform features were not seen during this recording.       HISTORY OF PRESENT ILLNESS (Dr Aroor 07/08/2018) QUANTA ROHER is an 62 y.o. male with PMH TIA, DM 2, HLD who presented to [facility] for sudden onset blurry vision, diplopia, and confusion.  Patient was out shopping when he went to use the credit card machine he suddenly could not remember how to use the machine. His vision was also blurry. Wife took patient to the hospital where tele-neurology was consulted. TPA bolus started  at 1400.  Patient transferred to [facility] for post TPA care. patient normally takes Aspirin 81 mg and plavix 75 mg. Denies HA, focal weakness, CP , SOB. Denies  Smoking, ETOH or drug abuse.   Outside Hospital : [facility]: 263 INR  1.1 CTA: calcified atherosclerotic disease at the carotid bifurcation w/o hemodynamically significant stenosis. No dissection or aneurysm. Intracranial arteries and veins are patent and unremarkable.  CT Head: no acute intracranial abnormality.   No prior stroke history noted. TIA 2010.  Date last known well: Date: 07/08/2018 Time last known well: Time: 11:56 tPA Given: Yes (TPA started at 1400)   Modified Rankin: Rankin Score=0  NIHSS:0  HOSPITAL COURSE Mr. BODEN STUCKY is a 63 y.o. male with history of a previous TIA, coronary artery disease status post coronary artery bypass graft surgery, diabetes mellitus, obstructive sleep apnea, migraines, hyperlipidemia, congestive heart failure, anxiety, and arthritis presenting with blurred vision and confusion.  TPA giveb at 1400 on 07/08/2018 at OSH.  Stroke like episode s/p IV TPA   Resultant  No deficits  CT head - OSH - no acute intracranial abnormality.   CTA - OSH - calcified atherosclerotic disease at the carotid bifurcation w/o hemodynamically significant stenosis. No dissection or aneurysm. Intracranial arteries and veins are patent and unremarkable.  MRI head - Negative for acute infarct or hemorrhage. Mild chronic microvascular ischemia in the white matter  MRA head - negative  Carotid Doppler - pending  2D Echo  - EF 60 - 65%. No cardiac source of emboli identified.   CTA OSH - calcified atherosclerotic disease at the carotid bifurcation w/o hemodynamically significant stenosis. No dissection or aneurysm. Intracranial arteries and veins are patent and unremarkable.  LDL - 38  HgbA1c - 7.3  VTE prophylaxis - SCDs  Diet - carb modified with thin liquids.  aspirin 81 mg  daily and clopidogrel 75 mg daily prior to admission, now on No antithrombotic  Patient counseled to be compliant with his antithrombotic medications  Ongoing aggressive stroke risk factor management  Therapy recommendations: Outpatient PT  and OT  Disposition:  home  Hypertension  Stable  Permissive hypertension SBP < 180 S/P TPA  Long-term BP goal normotensive  Hyperlipidemia  Lipid lowering medication PTA: Pravachol 20 mg daily  LDL 38, goal < 70  Current lipid lowering medication - resume Pravachol and Zetia  Continue statin at discharge  Diabetes  HgbA1c 7.3, goal < 7.0  Uncontrolled  Other Stroke Risk Factors  Advanced age  Obesity, Body mass index is 35.55 kg/m., recommend weight loss, diet and exercise as appropriate   Hx of TIA  Coronary artery disease  Migraines  Obstructive sleep apnea, on CPAP at home  Other Active Problems  Check labs in AM  DISCHARGE EXAM Blood pressure (!) 156/67, pulse 80, temperature 97.8 F (36.6 C), temperature source Oral, resp. rate 20, height 5' 9"  (1.753 m), weight 109.2 kg, SpO2 93 %.  Vitals:   07/10/18 0159 07/10/18 0321 07/10/18 0757 07/10/18 1122  BP: (!) 155/74 (!) 161/77 (!) 157/82 (!) 156/67  Pulse: 76 73 70 80  Resp: 16 18 18 20   Temp:  (!) 97.4 F (36.3 C) 97.7 F (36.5 C) 97.8 F (36.6 C)  TempSrc:  Oral Oral Oral  SpO2: 95% 97% 100% 93%  Weight:      Height:       Obese middle-age Caucasian male currently not in distress. . Afebrile. Head is nontraumatic. Neck is supple without bruit.    Cardiac exam no murmur or gallop. Lungs are clear to auscultation. Distal pulses are well felt. Neurological Exam ;  Awake  Alert oriented x 3. Normal speech and language.eye movements full without nystagmus.fundi were not visualized. Vision acuity and fields appear normal. Hearing is normal. Palatal movements are normal. Face symmetric. Tongue midline. Normal strength, tone, reflexes and coordination. Normal sensation. Gait deferred.   Discharge Diet    Diet Order            Diet Carb Modified Fluid consistency: Thin; Room service appropriate? Yes  Diet effective now             liquids    DISCHARGE INSTRUCTIONS TO PATIENT 1.  Gradually increase activity as tolerated 2. Outpatient physical and occupational therapy will be arranged. 3. No driving for two weeks. Be sure to have someone with you when you start driving again. 4. Check with your primary care provider about your glucose control. 5. Aspirin and Plavix for 3 weeks then aspirin 81 mg daily without Plavix. (stop Plavix in 3 weeks)    DISCHARGE PLAN  Disposition: Discharge to home with outpatient PT and OT.  aspirin 81 mg daily and clopidogrel 75 mg daily for 3 weeks then aspirin 81 mg daily without Plavix. For secondary stroke prevention.  Ongoing risk factor control by Primary Care Physician at time of discharge  Follow-up Deloria Lair., MD in 2 weeks.  Follow-up in [facility] in 6 weeks, office to schedule an appointment.   40 minutes were spent preparing discharge.  Mikey Bussing PA-C [facility] Pager [phone] 07/10/2018, 1:12 PM I have personally examined this patient, reviewed notes, independently viewed imaging studies, participated in medical decision making and plan of care.ROS completed by me personally and pertinent positives fully documented  I have made any additions or clarifications directly  to the above note. Agree with note above.  Antony Contras, MD Medical Director [facility] Pager: [phone] 07/10/2018 3:06 PM

## 2018-07-10 NOTE — Evaluation (Signed)
Physical Therapy Evaluation Patient Details Name: [name] [name] MRN: [id number] DOB: [birth year] Today's Date: 07/10/2018   History of Present Illness  [name] is a 62 y.o. male with history of a previous TIA, coronary artery disease status post coronary artery bypass graft surgery, diabetes mellitus, obstructive sleep apnea, migraines, hyperlipidemia, congestive heart failure, anxiety, and arthritis presenting with blurred vision and confusion.  Clinical Impression   Pt admitted with above diagnosis. Pt currently with functional limitations due to the deficits listed below (see PT Problem List). Independent prior to admission; presents with gait and balance dysfunction;  Pt will benefit from skilled PT to increase their independence and safety with mobility to allow discharge to the venue listed below.       Follow Up Recommendations Outpatient PT    Equipment Recommendations  Cane;Other (comment)(Can defer decision re: assistive device to Outpt PT)    Recommendations for Other Services       Precautions / Restrictions Precautions Precautions: None      Mobility  Bed Mobility Overal bed mobility: Independent                Transfers Overall transfer level: Needs assistance Equipment used: None Transfers: Sit to/from Stand Sit to Stand: Supervision         General transfer comment: mildly unsteady  Ambulation/Gait Ambulation/Gait assistance: Min guard;Supervision Gait Distance (Feet): 200 Feet Assistive device: None Gait Pattern/deviations: Step-through pattern;Wide base of support     General Gait Details: Slow steps, stiff, guarded gait, but no gross loss of balance noted; minguard initially, progressing to Supervision; cues to self-monitor for activity tolernace  Stairs Stairs: Yes Stairs assistance: Min guard Stair Management: No rails;Forwards;Step to pattern Number of Stairs: 2 General stair comments: Able to get up steps, but rachingout  for UE support due to weakness  Wheelchair Mobility    Modified Rankin (Stroke Patients Only)       Balance Overall balance assessment: Mild deficits observed, not formally tested                                           Pertinent Vitals/Pain Pain Assessment: No/denies pain    Home Living Family/patient expects to be discharged to:: Private residence Living Arrangements: Spouse/significant other Available Help at Discharge: Family;Available PRN/intermittently Type of Home: House Home Access: Stairs to enter Entrance Stairs-Rails: None Entrance Stairs-Number of Steps: 2 Home Layout: Two level;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   He does not do yard work or heavy, strenuous [place].  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       Hand Dominance   Dominant Hand: Right    Extremity/Trunk Assessment   Upper Extremity Assessment Upper Extremity Assessment: Defer to OT evaluation    Lower Extremity Assessment Lower Extremity Assessment: Generalized weakness(but enough functional strength for household amb)       Communication   Communication: No difficulties  Cognition Arousal/Alertness: Awake/alert Behavior During Therapy: WFL for tasks assessed/performed Overall Cognitive Status: Within Functional Limits for tasks assessed(for basic mobiltiy tasks)  General Comments      Exercises     Assessment/Plan    PT Assessment Patient needs continued PT services  PT Problem List Decreased strength;Decreased activity tolerance;Decreased balance;Decreased mobility;Decreased knowledge of use of DME;Decreased safety awareness;Decreased knowledge of precautions       PT  Treatment Interventions DME instruction;Gait training;Stair training;Functional mobility training;Therapeutic activities;Therapeutic exercise;Balance training;Cognitive remediation;Patient/family education;Neuromuscular re-education    PT Goals (Current goals can be found in the Care Plan section)  Acute Rehab PT Goals Patient Stated Goal: To get back to normal PT Goal Formulation: With patient Time For Goal Achievement: 07/24/18 Potential to Achieve Goals: Good    Frequency Min 4X/week   Barriers to discharge        Co-evaluation               AM-PAC PT "6 Clicks" Daily Activity  Outcome Measure Difficulty turning over in bed (including adjusting bedclothes, sheets and blankets)?: None Difficulty moving from lying on back to sitting on the side of the bed? : None Difficulty sitting down on and standing up from a chair with arms (e.g., wheelchair, bedside commode, etc,.)?: A Little Help needed moving to and from a bed to chair (including a wheelchair)?: None Help needed walking in hospital room?: None Help needed climbing 3-5 steps with a railing? : A Little 6 Click Score: 22    End of Session Equipment Utilized During Treatment: Gait belt Activity Tolerance: Patient tolerated treatment well Patient left: in bed;with call bell/phone within reach(sitting EOB) Nurse Communication: Mobility status PT Visit Diagnosis: Unsteadiness on feet (R26.81);Other abnormalities of gait and mobility (R26.89)    Time: 1200(start time is approx)-1231 PT Time Calculation (min) (ACUTE ONLY): 31 min   Charges:   PT Evaluation $PT Eval Low Complexity: 1 Low PT Treatments $Gait Training: 8-22 mins        Roney Marion, PT  Acute Rehabilitation Services Pager [phone] Office [phone]   [place]letta [place] 07/10/2018, 3:55 PM

## 2018-07-10 NOTE — Progress Notes (Signed)
EEG completed; results pending.    

## 2018-07-10 NOTE — Care Management Note (Signed)
Case Management Note  Patient Details  Name: [name] [name] MRN: [id number] Date of Birth: 27-Jun-1956  Subjective/Objective:                    Action/Plan:  Referral placed to neuro rehab for outpatient PT OT. No other CM needs identified.  Expected Discharge Date:  07/10/18               Expected Discharge Plan:  Home/Self Care  In-House Referral:     Discharge planning Services  CM Consult  Post Acute Care Choice:    Choice offered to:     DME Arranged:    DME Agency:     HH Arranged:    HH Agency:     Status of Service:  Completed, signed off  If discussed at [employer] of Stay Meetings, dates discussed:    Additional Comments:  Carles Collet, RN 07/10/2018, 2:01 PM

## 2018-07-10 NOTE — Care Management Note (Signed)
Case Management Note  Patient Details  Name: [name] [name] MRN: [id number] Date of Birth: 1956/04/04  Subjective/Objective:          As patient was being d/c'd, pt's wife states that they cannot transport patient from OP therapy and requests HH therapies.             Action/Plan: Order obtained and d/w patient and wife.  Choice offered and [facility] was chosen.  Sharmon Revere with [facility] given referral.  [facility] 9/4.  Wife given contact information for [facility] over the phone.    Expected Discharge Date:  07/10/18               Expected Discharge Plan:  Home/Self Care  In-House Referral:     Discharge planning Services  CM Consult  Post Acute Care Choice:  Home Health Choice offered to:  Spouse, Patient  DME Arranged:    DME Agency:     HH Arranged:  PT, OT HH Agency:  [place]  Status of Service:  Completed, signed off  If discussed at [place] of Stay Meetings, dates discussed:    Additional Comments:  Claudie Leach, RN 07/10/2018, 6:20 PM

## 2018-07-10 NOTE — Procedures (Signed)
Date of recording 07/10/2018  Referring physician Sethi  Reason for the study Altered mental status  Technical Digital EEG recording using 10-20 international electrode system  Description of the recording When awake posterior dominant rhythm is8Hz  symmetrical reactive Non REM stage II sleep was not obtained Epileptiform features were not seen during this recording  Impression The EEG isabnormalare findings are suggestive of mild generalized cerebral dysfunction.Epileptiform features were not seen during this recording.

## 2018-07-10 NOTE — Evaluation (Signed)
Speech Language Pathology Evaluation Patient Details Name: [name] [name] MRN: [id number] DOB: [birth year] Today's Date: 07/10/2018 Time: 8938-1017 SLP Time Calculation (min) (ACUTE ONLY): 23 min  Problem List:  Patient Active Problem List   Diagnosis Date Noted  . Aphasia 07/09/2018  . Cerebral thrombosis with cerebral infarction 07/09/2018  . Chest pain 08/10/2017  . 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 Coronary artery disease involving native coronary artery with unstable angina pectoris ([place])   . CAD -S/P PCI LAD OM/LAD '13 and 2016 12/31/2015  . Hyperlipidemia   . Chest pain with high risk of acute coronary syndrome 12/23/2015  . Precordial chest pain 12/03/2014  . TIA (transient ischemic attack)   . CAD- diffuse LAD disease at cath 12/30/15   . Thrombocytopenia ([place])   . Accelerating angina ([place]) 12/03/2013  . Type 2 diabetes mellitus ([place])   . Anxiety   . PVC's (premature ventricular contractions) 01/06/2013  . Obstructive sleep apnea-declines C-pap 07/04/2012  . Mixed hyperlipidemia 07/30/2009  . Essential hypertension, benign 07/30/2009   Past Medical History:  Past Medical History:  Diagnosis Date  . Anxiety   . Arthritis   . Asthma   . Cataract    Right eye  . CHF (congestive heart failure) ([place])   . Chronic lower back pain   . Coronary atherosclerosis of native coronary artery    a. LAD & OM stenting;  b. 2007 Cath: nonobs dzs, patent stents;  c. 07/2012 neg MV, EF 61%.  d. 11/20/14: [place] s/p  PCI w/ DES to pLAD and DES to OM1   . Essential hypertension, benign   . GERD (gastroesophageal reflux disease)   . Hearing loss of left ear   . History of gout   . History of hiatal hernia   . Hypercholesteremia   . Lumbar herniated disc   . MI, old   . Migraine   . OSA on CPAP   . 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 Thrombocytopenia ([place])   . TIA (transient ischemic attack) ~ 2010  . Type 2 diabetes  mellitus ([place])    Past Surgical History:  Past Surgical History:  Procedure Laterality Date  . ANTERIOR CERVICAL DECOMP/DISCECTOMY FUSION  1998   "C3-4"  . CARDIAC CATHETERIZATION  "several"  . CARDIAC CATHETERIZATION N/A 12/30/2015   Procedure: Left Heart Cath and Coronary Angiography;  Surgeon: [name] Harp, MD;  Location: [place] [facility];  Service: Cardiovascular;  Laterality: N/A;  . CARPAL TUNNEL RELEASE Bilateral 2005  . COLONOSCOPY    . CORONARY ANGIOPLASTY    . CORONARY ANGIOPLASTY WITH STENT PLACEMENT  2002; 2003; 11/20/2014   "I have 4 stents after today" (11/20/2014)  . CORONARY ARTERY BYPASS GRAFT N/A 01/09/2016   Procedure: CORONARY ARTERY BYPASS GRAFTING (CABG) X 3 UTILIZING RIGHT AND LEFT INTERNAL MAMMARY ARTERY AND ENDOSCOPICALLY HARVESTED SAPHENEOUS VEIN.;  Surgeon: [name] Alberts, MD;  Location: [facility];  Service: Open Heart Surgery;  Laterality: N/A;  . ESOPHAGOGASTRODUODENOSCOPY    . KNEE SURGERY Left 02/2012   "scraped; open"  . LEFT HEART CATH AND CORS/GRAFTS ANGIOGRAPHY N/A 08/09/2017   Procedure: LEFT HEART CATH AND CORS/GRAFTS ANGIOGRAPHY;  Surgeon: [name] Bush, MD;  Location: [place] [facility];  Service: Cardiovascular;  Laterality: N/A;  . LEFT HEART CATHETERIZATION WITH CORONARY ANGIOGRAM N/A 07/20/2012   Procedure: LEFT HEART CATHETERIZATION WITH CORONARY ANGIOGRAM;  Surgeon: [name] Clause  Fletcher Anon, MD;  Location: [place] C[facility] LAB;  Service: Cardiovascular;  Laterality: N/A;  . LEFT HEART CATHETERIZATION WITH CORONARY ANGIOGRAM N/A 11/20/2014   Procedure: LEFT HEART CATHETERIZATION WITH CORONARY ANGIOGRAM;  Surgeon: [name] M [name], MD;  Location: [facility] CATH LAB;  Service: Cardiovascular;  Laterality: N/A;  . LEFT HEART CATHETERIZATION WITH CORONARY ANGIOGRAM N/A 11/26/2014   Procedure: LEFT HEART CATHETERIZATION WITH CORONARY ANGIOGRAM;  Surgeon: [name] M [name], MD;  Location: [facility] C[facility] LAB;  Service: Cardiovascular;  Laterality: N/A;  . NEUROPLASTY / TRANSPOSITION ULNAR  NERVE AT ELBOW Right ~ 2012  . PERCUTANEOUS CORONARY ROTOBLATOR INTERVENTION (PCI-R)  11/20/2014   Procedure: PERCUTANEOUS CORONARY ROTOBLATOR INTERVENTION (PCI-R);  Surgeon: [name] M [name], MD;  Location: [facility] C[facility] LAB;  Service: Cardiovascular;;  . SHOULDER ARTHROSCOPY Left ~ 2011  . TEE WITHOUT CARDIOVERSION N/A 01/09/2016   Procedure: TRANSESOPHAGEAL ECHOCARDIOGRAM (TEE);  Surgeon: [name] Alberts, MD;  Location: [place];  Service: Open Heart Surgery;  Laterality: N/A;   HPI:  [name] [name] is a 62 y.o. male with history of a previous TIA, coronary artery disease status post coronary artery bypass graft surgery, diabetes mellitus, obstructive sleep apnea, migraines, hyperlipidemia, congestive heart failure, anxiety, and arthritis presenting with blurred vision and confusion.  Received TPA at OSH, imaging negative for acute findings.   Assessment / Plan / Recommendation Clinical Impression   Patient presents with mild cognitive impairments, with deficits seen today in higher level attention, complex verbal reasoning, recall and awareness of deficits. Wife reports she has noticed a change in pt's ability to "process" things and has noted him asking questions repeatedly, forgetting recent conversations. When asked about changes in his thinking, pt denies but states, "she'll tell you it's different" (wife). He is noted to use humor, joking during assessment in apparent attempt to mask difficulties with recall, attention. Administered MOCA-Basic; pt scored 23/30, consistent with mild impairment (26 and above is considered normal). Speech is clear without dysarthria, no focal cranial nerve impairments noted. Recommend skilled ST at d/c to maximize cognition to facilitate return to prior activities. Wife initially requesting OP services at [facility], though states she is also considering [facility].      SLP Assessment  SLP Recommendation/Assessment: All further Speech Lanaguage Pathology  needs can be addressed  in the next venue of care SLP Visit Diagnosis: Cognitive communication deficit (R41.841)    Follow Up Recommendations  Home health SLP;Outpatient SLP Deneise L[facility] [place])    Frequency and Duration           SLP Evaluation Cognition  Overall Cognitive Status: Impaired/Different from baseline Arousal/Alertness: Awake/alert Orientation Level: Oriented X4 Attention: Focused;Sustained;Selective;Alternating Focused Attention: Appears intact Sustained Attention: Appears intact Selective Attention: Appears intact Alternating Attention: Impaired Alternating Attention Impairment: Functional basic Memory: Impaired Memory Impairment: Decreased recall of new information;Decreased short term memory(delayed recall 3/5) Decreased Short Term Memory: Functional basic;Verbal basic(wife reports pt repeating questions, forgetting conversation) Awareness: Impaired Awareness Impairment: Emergent impairment;Anticipatory impairment Problem Solving: Appears intact(simple) Executive Function: Reasoning Reasoning: Impaired Reasoning Impairment: Verbal complex(abstract reasoning impaired) Behaviors: Other (comment)(decreased task persistence, using humor to mask deficits)       Comprehension  Auditory Comprehension Overall Auditory Comprehension: Appears within functional limits for tasks assessed Yes/No Questions: Within Functional Limits Commands: Within Functional Limits Conversation: Simple Other Conversation Comments: wife reports poor recall of conversations Interfering Components: Attention Visual Recognition/Discrimination Discrimination: Within Function Limits Reading Comprehension Reading Status: Not tested    Expression Expression Primary Mode of Expression: Verbal Verbal Expression Overall Verbal  Expression: Appears within functional limits for tasks assessed Naming: Impairment Confrontation: Within functional limits Divergent: 50-74% accurate(decreased attention/task  persistence) Pragmatics: Impairment Impairments: Abnormal affect Interfering Components: Attention Written Expression Dominant Hand: Right Written Expression: Not tested   Oral / Motor  Oral Motor/Sensory Function Overall Oral Motor/Sensory Function: Within functional limits Motor Speech Overall Motor Speech: Appears within functional limits for tasks assessed   [place], [place], [place] Pager: [phone] Office: [phone]  Aliene Altes 07/10/2018, 2:58 PM

## 2018-07-10 NOTE — Progress Notes (Signed)
Occupational Therapy Progress Note  Pt requires min A for tub transfer - pt and wife instructed on safe technique and use of tub DME.   Partial occlusion to reading glasses Lt lens to reduce diplopia and increase ease of reading.  Pt instructed on HEP to improve convergence - he demonstrated understanding.  Wife now states they have no one who can transport him to OP, therefore, follow up recommendation changed to [facility].  Recommend HHOT.  No DME needs     07/10/18 1554  OT Visit Information  Last OT Received On 07/10/18  Assistance Needed +1  History of Present Illness [name] is a 62 y.o. male with history of a previous TIA, coronary artery disease status post coronary artery bypass graft surgery, diabetes mellitus, obstructive sleep apnea, migraines, hyperlipidemia, congestive heart failure, anxiety, and arthritis presenting with blurred vision and confusion.  Precautions  Precautions Fall  Pain Assessment  Pain Assessment No/denies pain  Cognition  Arousal/Alertness Awake/alert  Behavior During Therapy WFL for tasks assessed/performed  Overall Cognitive Status Within Functional Limits for tasks assessed  General Comments for basic ADLs.   SLP administered  MOCA basic and pt scored 23/30   Upper Extremity Assessment  Upper Extremity Assessment Overall WFL for tasks assessed  Lower Extremity Assessment  Lower Extremity Assessment Defer to PT evaluation  ADL  Overall ADL's  Needs assistance/impaired  Eating/Feeding Independent  Upper Body Bathing Min guard;Standing  Lower Body Bathing Min guard;Sit to/from stand  Lower Body Bathing Details (indicate cue type and reason) Pt simulated shower in standing with min guard assist - no LOB noted   Tub/ Chemical engineer Details (indicate cue type and reason) wife thinks they have a tub transer bench in basement that he can use.  Discussed options for tub seats if they don't have the bench    Functional mobility during ADLs Supervision/safety  Bed Mobility  Overal bed mobility Independent  Balance  Overall balance assessment Mild deficits observed, not formally tested  Vision- Assessment  Vision Assessment? Yes  Additional Comments Pt continues with diplopia with near vision.   Partial occlusion to reading glasses, and pt was instructed in HEP for convergence.  He demonstrated understanding   Transfers  Overall transfer level Needs assistance  Equipment used None  Transfers Sit to/from [employer] Transfers  Sit to Stand Supervision  Stand pivot transfers Supervision  General transfer comment mildly unsteady  OT - End of Session  Equipment Utilized During Treatment Gait belt  Activity Tolerance Patient tolerated treatment well  Patient left in chair;with call bell/phone within reach;with family/visitor present  Nurse Communication Mobility status  OT Assessment/Plan  OT Plan Discharge plan needs to be updated  OT Visit Diagnosis Unsteadiness on feet (R26.81);Low vision, both eyes (H54.2)  OT Frequency (ACUTE ONLY) Min 2X/week  Follow Up Recommendations Home health OT;Supervision - Intermittent (family now reports they can't transport him )  OT Equipment None recommended by OT  AM-PAC OT "6 Clicks" Daily Activity Outcome Measure  Help from another person eating meals? 4  Help from another person taking care of personal grooming? 3  Help from another person toileting, which includes using toliet, bedpan, or urinal? 3  Help from another person bathing (including washing, rinsing, drying)? 3  Help from another person to put on and taking off regular upper body clothing? 3  Help from another person to put on and taking off regular lower body clothing? 3  6 Click Score  19  ADL G Code Conversion CK  OT Goal Progression  Progress towards OT goals Progressing toward goals  OT Time Calculation  OT Start Time (ACUTE ONLY) 1526  OT Stop Time (ACUTE ONLY) 1553  OT Time  Calculation (min) 27 min  OT General Charges  $OT Visit 1 Visit  OT Treatments  $Self Care/Home Management  8-22 mins  $Therapeutic Activity 8-22 mins  Lucille Passy, OTR/L Acute Rehabilitation Services Pager [phone] Office [phone]

## 2018-08-11 ENCOUNTER — Other Ambulatory Visit: Payer: Self-pay | Admitting: Cardiology

## 2018-08-22 ENCOUNTER — Other Ambulatory Visit: Payer: Self-pay | Admitting: Cardiology

## 2018-08-30 ENCOUNTER — Encounter: Payer: Self-pay | Admitting: Adult Health

## 2018-08-30 ENCOUNTER — Ambulatory Visit (INDEPENDENT_AMBULATORY_CARE_PROVIDER_SITE_OTHER): Payer: Medicare Other | Admitting: Adult Health

## 2018-08-30 VITALS — BP 125/71 | HR 68 | Ht 69.0 in | Wt 242.0 lb

## 2018-08-30 DIAGNOSIS — I10 Essential (primary) hypertension: Secondary | ICD-10-CM | POA: Diagnosis not present

## 2018-08-30 DIAGNOSIS — R2990 Unspecified symptoms and signs involving the nervous system: Secondary | ICD-10-CM

## 2018-08-30 DIAGNOSIS — E1100 Type 2 diabetes mellitus with hyperosmolarity without nonketotic hyperglycemic-hyperosmolar coma (NKHHC): Secondary | ICD-10-CM | POA: Diagnosis not present

## 2018-08-30 DIAGNOSIS — G4733 Obstructive sleep apnea (adult) (pediatric): Secondary | ICD-10-CM

## 2018-08-30 DIAGNOSIS — I639 Cerebral infarction, unspecified: Secondary | ICD-10-CM

## 2018-08-30 DIAGNOSIS — E785 Hyperlipidemia, unspecified: Secondary | ICD-10-CM

## 2018-08-30 NOTE — Progress Notes (Signed)
[facility] [address] [place]. [place] [address] [phone]       OFFICE FOLLOW UP NOTE  [name] Date of Birth:  1956-01-07 Medical Record Number:  [id number]   Reason for Referral:  hospital stroke follow up  CHIEF COMPLAINT:  Chief Complaint  Patient presents with  . Follow-up    Stroke follow up from hospital room 9 pt alone    HPI: [name] [name] is being seen today for initial visit in the office for strokelike episode s/p IV TPA on 07/08/2018. History obtained from patient and chart review. Reviewed all radiology images and labs personally.  [name] Quach Broomeis an 62 y.o.malewith PMH TIA, DM 2, HLD, CAD s/p CABG, OSA, migraines, CHF, anxiety and arthritis who presented to [facility] with sudden onset blurry vision, diplopia, and confusion.  Telemetry neurology was consulted.  CT head was negative for acute abnormality.  CTA head and neck show calcified arthrosclerosis disease at the carotid bifurcation without hemodynamically significant stenosis without evidence of dissection or aneurysm therefore IV TPA was administered and was transferred to [facility] for further work-up and management.  MRI brain reviewed and was negative for acute infarct or hemorrhage.  MRA negative.  Carotid Doppler showed bilateral ICA stenosis of 1 to 39% with VAs antegrade.  2D echo showed an EF of 60 to 65% without cardiac source of embolus identified.  LDL 38 and recommended resuming Pravachol and Zetia at discharge.  A1c 7.3 and recommended continued follow-up with PCP for DM management.  HTN stable during admission recommended long-term BP goal normotensive range.  Patient was previously on aspirin and Plavix and it was recommended to continue this for 3 weeks and then aspirin 81 mg alone.  Patient was discharged home with recommendations of outpatient PT/OT in stable condition.  Patient is being seen today for hospital follow up. Continues to have some blurry  vision and was told he has "a weak eye" by a provider in the hospital. He has not follow up with his ophthalmology  since discharge. 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 Has returned to driving without complications. Memory at times is slightly slower but has been improving. Completed 3 weeks of DAPT and continues aspriin 80m only without side effects of bleeding or bruising. Continues to take pravastatin and zetia without myalgias. Blood pressure today 125/71 which is normal for patient as he monitors at home. Patient currently has therapy dog which has been helping patient with his stress and anxiety. History of OSA but has not been using CPAP as he states he is unable to find the correct company to replace parts.  He has not used his CPAP in 2 to 3 months.  He was advised to follow-up with provider who is managing [place] therefore he is requesting establishment with new OSA provider.  No further concerns at this time. Denies new or worsening TIA/stroke symptoms.  Side notes, patient complains of ankle swelling towards end of the day. Denies use of compression stockings. He will elevate with positive effect but concerned due to recent worsening over the past 2-3 weeks. He does state that he has been increasing his exercise in this time with obtaining PT/OT.  He denies shortness of breath, increased fatigue  or chest pain.     ROS:   14 system review of systems performed and negative with exception of swelling in legs, hearing loss, ringing in ears, moles, blurred vision, snoring, numbness, difficulty swallowing, dizziness, anxiety, insomnia and restless leg  PMH:  Past Medical History:  Diagnosis Date  . Anxiety   . Arthritis   . Asthma   . Cataract    Right eye  .  CHF (congestive heart failure) ([place])   . Chronic lower back pain   . Coronary atherosclerosis of native coronary artery    a. LAD & OM stenting;  b. 2007 Cath: nonobs dzs, patent stents;  c. 07/2012 neg MV, EF 61%.  d. 11/20/14: [place] s/p  PCI w/ DES to pLAD and DES to OM1   . Essential hypertension, benign   . GERD (gastroesophageal reflux disease)   . Hearing loss of left ear   . History of gout   . History of hiatal hernia   . Hypercholesteremia   . Lumbar herniated disc   . MI, old   . Migraine   . OSA on CPAP   . 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 Thrombocytopenia ([place])   . TIA (transient ischemic attack) ~ 2010  . Type 2 diabetes mellitus (HCC)     PSH:  Past Surgical History:  Procedure Laterality Date  . ANTERIOR CERVICAL DECOMP/DISCECTOMY FUSION  1998   "C3-4"  . CARDIAC CATHETERIZATION  "several"  . CARDIAC CATHETERIZATION N/A 12/30/2015   Procedure: Left Heart Cath and Coronary Angiography;  Surgeon: [name], MD;  Location: [facility];  Service: Cardiovascular;  Laterality: N/A;  . CARPAL TUNNEL RELEASE Bilateral 2005  . COLONOSCOPY    . CORONARY ANGIOPLASTY    . CORONARY ANGIOPLASTY WITH STENT PLACEMENT  2002; 2003; 11/20/2014   "I have 4 stents after today" (11/20/2014)  . CORONARY ARTERY BYPASS GRAFT N/A 01/09/2016   Procedure: CORONARY ARTERY BYPASS GRAFTING (CABG) X 3 UTILIZING RIGHT AND LEFT INTERNAL MAMMARY ARTERY AND ENDOSCOPICALLY HARVESTED SAPHENEOUS VEIN.;  Surgeon: Rexene Alberts, MD;  Location: [facility];  Service: Open Heart Surgery;  Laterality: N/A;  . ESOPHAGOGASTRODUODENOSCOPY    . KNEE SURGERY Left 02/2012   "scraped; open"  . LEFT HEART CATH AND CORS/GRAFTS ANGIOGRAPHY N/A 08/09/2017   Procedure: LEFT HEART CATH AND CORS/GRAFTS ANGIOGRAPHY;  Surgeon: [name], MD;  Location: [facility];  Service: Cardiovascular;  Laterality: N/A;  . LEFT HEART  CATHETERIZATION WITH CORONARY ANGIOGRAM N/A 07/20/2012   Procedure: LEFT HEART CATHETERIZATION WITH CORONARY ANGIOGRAM;  Surgeon: [name], MD;  Location: [facility];  Service: Cardiovascular;  Laterality: N/A;  . LEFT HEART CATHETERIZATION WITH CORONARY ANGIOGRAM N/A 11/20/2014   Procedure: LEFT HEART CATHETERIZATION WITH CORONARY ANGIOGRAM;  Surgeon: [name], MD;  Location: [facility] C[facility] LAB;  Service: Cardiovascular;  Laterality: N/A;  . LEFT HEART CATHETERIZATION WITH CORONARY ANGIOGRAM N/A 11/26/2014   Procedure: LEFT HEART CATHETERIZATION WITH CORONARY ANGIOGRAM;  Surgeon: [name], MD;  Location: [facility] C[facility] LAB;  Service: Cardiovascular;  Laterality: N/A;  . NEUROPLASTY / TRANSPOSITION ULNAR NERVE AT ELBOW Right ~ 2012  . PERCUTANEOUS CORONARY ROTOBLATOR INTERVENTION (PCI-R)  11/20/2014   Procedure: PERCUTANEOUS CORONARY ROTOBLATOR INTERVENTION (PCI-R);  Surgeon: [name], MD;  Location: [facility];  Service: Cardiovascular;;  . SHOULDER ARTHROSCOPY Left ~ 2011  . TEE  WITHOUT CARDIOVERSION N/A 01/09/2016   Procedure: TRANSESOPHAGEAL ECHOCARDIOGRAM (TEE);  Surgeon: Rexene Alberts, MD;  Location: [facility];  Service: Open Heart Surgery;  Laterality: N/A;    Social History:  Social History   Socioeconomic History  . Marital status: Married    Spouse name: [name]  . Number of children: 3  . Years of education: 31 th   . Highest education level: Not on file  Occupational History  . Occupation: Unemployed  Social Needs  . Financial resource strain: Not on file  . Food insecurity:    Worry: Not on file    Inability: Not on file  . Transportation needs:    Medical: Not on file    Non-medical: Not on file  Tobacco Use  . Smoking status: Never Smoker  . Smokeless tobacco: Never Used  Substance and Sexual Activity  . Alcohol use: No  . Drug use: No  . Sexual activity: Yes  Lifestyle  . Physical activity:    Days per week: Not on file    Minutes per session: Not on  file  . Stress: Not on file  Relationships  . Social connections:    Talks on phone: Not on file    Gets together: Not on file    Attends religious service: Not on file    Active member of club or organization: Not on file    Attends meetings of clubs or organizations: Not on file    Relationship status: Not on file  . Intimate partner violence:    Fear of current or ex partner: Not on file    Emotionally abused: Not on file    Physically abused: Not on file    Forced sexual activity: Not on file  Other Topics Concern  . Not on file  Social History Narrative   Patient lives at home with wife [name].   Patient works at [employer], Engineer, petroleum.   Patient has a 12 th grade education.    Patient has 3 children.        Family History:  Family History  Problem Relation Age of Onset  . Coronary artery disease Father 73    Medications:   Current Outpatient Medications on File Prior to Visit  Medication Sig Dispense Refill  . acetaminophen (TYLENOL) 500 MG tablet Take 1,000 mg by mouth every 6 (six) hours as needed (pain).    [name] albuterol (PROVENTIL HFA;VENTOLIN HFA) 108 (90 BASE) MCG/ACT inhaler Inhale 2 puffs into the lungs every 6 (six) hours as needed for wheezing or shortness of breath.    [name] amLODipine (NORVASC) 5 MG tablet Take 5 mg by mouth at bedtime.  4  . aspirin EC 81 MG tablet Take 81 mg by mouth at bedtime.     . cetirizine (ZYRTEC) 10 MG tablet Take 10 mg by mouth at bedtime.    . citalopram (CELEXA) 20 MG tablet Take 20 mg by mouth at bedtime.     [name] ezetimibe (ZETIA) 10 MG tablet Take 10 mg by mouth at bedtime.    . fluticasone (FLONASE) 50 MCG/ACT nasal spray Place 1 spray into the nose 2 (two) times daily.     . Fluticasone-Salmeterol (ADVAIR) 250-50 MCG/DOSE AEPB Inhale 1 puff into the lungs every 12 (twelve) hours.    . gabapentin (NEURONTIN) 300 MG capsule Take 300 mg by mouth 3 (three) times daily.  2  . insulin aspart (NOVOLOG FLEXPEN) 100 UNIT/ML  FlexPen Inject 2-30 Units into the skin 3 (  three) times daily as needed for high blood sugar (CBG >150). Per sliding scale written down at home     . Insulin Degludec (TRESIBA FLEXTOUCH) 200 UNIT/ML SOPN Inject 60-70 Units into the skin See admin instructions. Inject 70 units subcutaneously every morning and 60 units at bedtime    . isosorbide mononitrate (IMDUR) 30 MG 24 hr tablet Take 30 mg by mouth at bedtime.  4  . Melatonin 5 MG TABS Take 10 mg by mouth at bedtime.     . metFORMIN (GLUCOPHAGE) 1000 MG tablet Take 1 tablet (1,000 mg total) by mouth 2 (two) times daily. 60 tablet 11  . metoprolol tartrate (LOPRESSOR) 50 MG tablet Take 50 mg by mouth at bedtime.    . nitroGLYCERIN (NITROSTAT) 0.4 MG SL tablet Place 1 tablet (0.4 mg total) under the tongue every 5 (five) minutes as needed for chest pain. 25 tablet 3  . omeprazole (PRILOSEC) 20 MG capsule Take 20 mg by mouth 2 (two) times daily before a meal.    . pravastatin (PRAVACHOL) 20 MG tablet TAKE 1 TABLET BY MOUTH EVERY DAY 14 tablet 0  . topiramate (TOPAMAX) 25 MG capsule Take 25 mg by mouth 2 (two) times daily.    . TRESIBA FLEXTOUCH 100 UNIT/ML SOPN FlexTouch Pen INJECT 60 UNITS INTO THE SKIN EVERY MORNING AND INJECT 70 UNITS INTO THE SKIN AT BEDTIME  5   No current facility-administered medications on file prior to visit.     Allergies:   Allergies  Allergen Reactions  . Divalproex Sodium Other (See Comments)    Causes anger  . Imdur [Isosorbide Dinitrate] Other (See Comments)    Severe headache (trying lower dose 07/05/18)  . Lipitor [Atorvastatin Calcium]     cramps  . Statins Other (See Comments)    Muscle aches and cramps  . Tramadol Other (See Comments)    Chest pain   . Tricor [Fenofibrate] Other (See Comments)    Leg cramps  . Valproic Acid Other (See Comments)    "anger"     Physical Exam  Vitals:   08/30/18 1420  BP: 125/71  Pulse: 68  Weight: 242 lb (109.8 kg)  Height: 5' 9"  (1.753 m)   Body mass  index is 35.74 kg/m. No exam data present  General: well developed, well nourished, pleasant middle-aged Caucasian male, seated, in no evident distress Head: head normocephalic and atraumatic.   Neck: supple with no carotid or supraclavicular bruits Cardiovascular: regular rate and rhythm, no murmurs Musculoskeletal: no deformity Skin:  no rash/petichiae Vascular:  Normal pulses all extremities  Neurologic Exam Mental Status: Awake and fully alert. Oriented to place and time. Recent and remote memory intact. Attention span, concentration and fund of knowledge appropriate. Mood and affect appropriate.  Cranial Nerves: Fundoscopic exam reveals sharp disc margins. Pupils equal, briskly reactive to light. Extraocular movements full without nystagmus. Visual fields full to confrontation. Hearing intact. Facial sensation intact. Face, tongue, palate moves normally and symmetrically.  Motor: Normal bulk and tone. Normal strength in all tested extremity muscles. Sensory.: intact to touch , pinprick , position and vibratory sensation.  Coordination: Rapid alternating movements normal in all extremities. Finger-to-nose and heel-to-shin performed accurately bilaterally. Gait and Station: Arises from chair without difficulty. Stance is normal. Gait demonstrates normal stride length and balance . Able to heel, toe and tandem walk without difficulty.  Reflexes: 1+ and symmetric. Toes downgoing.    NIHSS  0 Modified Rankin  1 (subjective blurry vision)    Diagnostic  Data (Labs, Imaging, Testing)  CTA head/neck (OSH)  calcified atherosclerotic disease at the carotid bifurcation w/o hemodynamically significant stenosis. No dissection or aneurysm. Intracranial arteries and veins are patent and unremarkable.   CT Head (OSH)  no acute intracranial abnormality.   MR BRAIN WO CONTRAST MR MRA HEAD WO CONTRAST 07/09/2018 IMPRESSION: Negative for acute infarct or hemorrhage. Mild chronic  microvascular ischemia in the white matter Negative MRA head   Transthoracic Echocardiogram  07/09/2018 Study Conclusions - Left ventricle: The cavity size was normal. Wall thickness was increased in a pattern of mild LVH. Systolic function was normal. The estimated ejection fraction was in the range of 60% to 65%. Wall motion was normal; there were no regional wall motion abnormalities. Left ventricular diastolic function parameters were normal for the patient&'s age. - Aortic valve: Mildly calcified annulus. Trileaflet; mildly calcified leaflets. There was mild regurgitation. - Mitral valve: There was trivial regurgitation. - Atrial septum: No defect or patent foramen ovale was identified. - Tricuspid valve: There was trivial regurgitation. - Pericardium, extracardiac: There was no pericardial effusion.  Bilateral Carotid Dopplers  07/10/2018 Final Interpretation: Right Carotid: Velocities in the right ICA are consistent with a 1-39% stenosis. Left Carotid: Velocities in the left ICA are consistent with a 1-39% stenosis. Vertebrals: Bilateral vertebral arteries demonstrate antegrade flow. Right vertebral artery demonstrates high resistant flow.  EEG 07/10/2018 Impression The EEG isabnormalare findings are suggestive of mild generalized cerebral dysfunction.Epileptiform features were not seen during this recording.   ASSESSMENT: GRAHM ETSITTY is a 62 y.o. year old male here with strokelike episode S/P IV TPA on 07/08/2018. Vascular risk factors include HTN, HLD, DM, OSA, CAD and CHF.  Patient is being seen today for hospital follow-up and he continues to have mild blurry vision with intermittent cognitive difficulties but both have been improving and denies any new or worsening stroke/TIA symptoms.    PLAN: -Continue aspirin 81 mg daily  and pravastatin and Zetia for secondary stroke prevention -F/u with PCP regarding your HLD, HTN and DM management -BLE edema  -advised patient to schedule appoint with cardiologist in regards to concerns of increased bilateral lower extremity edema.  In the meantime, recommended use of compression stockings, elevation of legs and decrease sodium intake -Referral placed to [facility] for establishment for OSA provider -Advised to schedule appointment with ophthalmology for routine follow-up along with continued monitoring of blurry vision complaints -continue to monitor BP at home -advised to continue to stay active and maintain a healthy diet -Maintain strict control of hypertension with blood pressure goal below 130/90, diabetes with hemoglobin A1c goal below 6.5% and cholesterol with LDL cholesterol (bad cholesterol) goal below 70 mg/dL. I also advised the patient to eat a healthy diet with plenty of whole grains, cereals, fruits and vegetables, exercise regularly and maintain ideal body weight.  Follow up in 3 months or call earlier if needed   Greater than 50% of time during this 25 minute visit was spent on counseling,explanation of diagnosis of strokelike episode, reviewing risk factor management of HTN, HLD, DM, CAD, CHF and OSA, planning of further management, discussion with patient and family and coordination of care    Venancio Poisson, AGNP-BC  [facility] [address] [place], [place] [address]  Phone [phone] Fax [phone] Note: This document was prepared with digital dictation and possible smart phrase technology. Any transcriptional errors that result from this process are unintentional.

## 2018-08-30 NOTE — Patient Instructions (Signed)
Continue aspirin 81 mg daily  and pravastatin and zetia  for secondary stroke prevention  Continue to follow up with PCP regarding cholesterol, blood pressure and diabetes management   Swelling - follow up with cardiologist along with use of compression stockings, elevation of legs, and decrease sodium intake  You will be called to set up appointment with one of our sleep providers for OSA management  Schedule appointment with eye doctor for routine follow up and continued blurry vision   Continue to stay active and maintain a healthy diet  Continue to monitor blood pressure at home  Maintain strict control of hypertension with blood pressure goal below 130/90, diabetes with hemoglobin A1c goal below 6.5% and cholesterol with LDL cholesterol (bad cholesterol) goal below 70 mg/dL. I also advised the patient to eat a healthy diet with plenty of whole grains, cereals, fruits and vegetables, exercise regularly and maintain ideal body weight.  Followup in the future with me in 3 months or call earlier if needed       Thank you for coming to see [place] at [facility]. I hope we have been able to provide you high quality care today.  You may receive a patient satisfaction survey over the next few weeks. We would appreciate your feedback and comments so that we may continue to improve ourselves and the health of our patients.

## 2018-08-31 NOTE — Progress Notes (Signed)
I agree with the above plan 

## 2018-09-13 ENCOUNTER — Other Ambulatory Visit: Payer: Self-pay | Admitting: *Deleted

## 2018-09-13 MED ORDER — PRAVASTATIN SODIUM 20 MG PO TABS: 20.0000 mg | ORAL_TABLET | Freq: Every day | ORAL | 0 refills | Status: DC

## 2018-11-20 ENCOUNTER — Encounter (HOSPITAL_COMMUNITY): Payer: Self-pay | Admitting: Emergency Medicine

## 2018-11-20 ENCOUNTER — Other Ambulatory Visit: Payer: Self-pay

## 2018-11-20 ENCOUNTER — Emergency Department (HOSPITAL_COMMUNITY): Payer: Medicare Other

## 2018-11-20 ENCOUNTER — Inpatient Hospital Stay (HOSPITAL_COMMUNITY)
Admission: EM | Admit: 2018-11-20 | Discharge: 2018-11-23 | DRG: 287 | Disposition: A | Discharge status: Home / Self Care | Payer: Medicare Other | Attending: Internal Medicine | Admitting: Internal Medicine

## 2018-11-20 DIAGNOSIS — K219 Gastro-esophageal reflux disease without esophagitis: Secondary | ICD-10-CM | POA: Diagnosis present

## 2018-11-20 DIAGNOSIS — I200 Unstable angina: Secondary | ICD-10-CM

## 2018-11-20 DIAGNOSIS — E669 Obesity, unspecified: Secondary | ICD-10-CM | POA: Diagnosis present

## 2018-11-20 DIAGNOSIS — Z794 Long term (current) use of insulin: Secondary | ICD-10-CM | POA: Diagnosis not present

## 2018-11-20 DIAGNOSIS — G8929 Other chronic pain: Secondary | ICD-10-CM | POA: Diagnosis present

## 2018-11-20 DIAGNOSIS — R0609 Other forms of dyspnea: Secondary | ICD-10-CM | POA: Diagnosis present

## 2018-11-20 DIAGNOSIS — R161 Splenomegaly, not elsewhere classified: Secondary | ICD-10-CM | POA: Diagnosis present

## 2018-11-20 DIAGNOSIS — R109 Unspecified abdominal pain: Secondary | ICD-10-CM | POA: Diagnosis not present

## 2018-11-20 DIAGNOSIS — K7581 Nonalcoholic steatohepatitis (NASH): Secondary | ICD-10-CM | POA: Diagnosis present

## 2018-11-20 DIAGNOSIS — I25110 Atherosclerotic heart disease of native coronary artery with unstable angina pectoris: Principal | ICD-10-CM | POA: Diagnosis present

## 2018-11-20 DIAGNOSIS — D696 Thrombocytopenia, unspecified: Secondary | ICD-10-CM | POA: Diagnosis present

## 2018-11-20 DIAGNOSIS — E1169 Type 2 diabetes mellitus with other specified complication: Secondary | ICD-10-CM

## 2018-11-20 DIAGNOSIS — R1013 Epigastric pain: Secondary | ICD-10-CM | POA: Diagnosis present

## 2018-11-20 DIAGNOSIS — E119 Type 2 diabetes mellitus without complications: Secondary | ICD-10-CM | POA: Diagnosis not present

## 2018-11-20 DIAGNOSIS — I10 Essential (primary) hypertension: Secondary | ICD-10-CM | POA: Diagnosis present

## 2018-11-20 DIAGNOSIS — D709 Neutropenia, unspecified: Secondary | ICD-10-CM | POA: Diagnosis not present

## 2018-11-20 DIAGNOSIS — I252 Old myocardial infarction: Secondary | ICD-10-CM

## 2018-11-20 DIAGNOSIS — I2510 Atherosclerotic heart disease of native coronary artery without angina pectoris: Secondary | ICD-10-CM | POA: Diagnosis present

## 2018-11-20 DIAGNOSIS — I509 Heart failure, unspecified: Secondary | ICD-10-CM | POA: Diagnosis not present

## 2018-11-20 DIAGNOSIS — E1165 Type 2 diabetes mellitus with hyperglycemia: Secondary | ICD-10-CM | POA: Diagnosis present

## 2018-11-20 DIAGNOSIS — G4733 Obstructive sleep apnea (adult) (pediatric): Secondary | ICD-10-CM | POA: Diagnosis present

## 2018-11-20 DIAGNOSIS — M545 Low back pain: Secondary | ICD-10-CM | POA: Diagnosis present

## 2018-11-20 DIAGNOSIS — Z8673 Personal history of transient ischemic attack (TIA), and cerebral infarction without residual deficits: Secondary | ICD-10-CM

## 2018-11-20 DIAGNOSIS — E782 Mixed hyperlipidemia: Secondary | ICD-10-CM | POA: Diagnosis present

## 2018-11-20 DIAGNOSIS — Z951 Presence of aortocoronary bypass graft: Secondary | ICD-10-CM

## 2018-11-20 DIAGNOSIS — J45909 Unspecified asthma, uncomplicated: Secondary | ICD-10-CM | POA: Diagnosis present

## 2018-11-20 DIAGNOSIS — D61818 Other pancytopenia: Secondary | ICD-10-CM | POA: Diagnosis present

## 2018-11-20 DIAGNOSIS — M1990 Unspecified osteoarthritis, unspecified site: Secondary | ICD-10-CM | POA: Diagnosis present

## 2018-11-20 DIAGNOSIS — I259 Chronic ischemic heart disease, unspecified: Secondary | ICD-10-CM | POA: Diagnosis not present

## 2018-11-20 DIAGNOSIS — Z7982 Long term (current) use of aspirin: Secondary | ICD-10-CM

## 2018-11-20 DIAGNOSIS — Z79899 Other long term (current) drug therapy: Secondary | ICD-10-CM

## 2018-11-20 DIAGNOSIS — H9192 Unspecified hearing loss, left ear: Secondary | ICD-10-CM | POA: Diagnosis present

## 2018-11-20 DIAGNOSIS — E1100 Type 2 diabetes mellitus with hyperosmolarity without nonketotic hyperglycemic-hyperosmolar coma (NKHHC): Secondary | ICD-10-CM | POA: Diagnosis not present

## 2018-11-20 DIAGNOSIS — I5032 Chronic diastolic (congestive) heart failure: Secondary | ICD-10-CM | POA: Diagnosis present

## 2018-11-20 DIAGNOSIS — Z23 Encounter for immunization: Secondary | ICD-10-CM

## 2018-11-20 DIAGNOSIS — I110 Hypertensive heart disease with heart failure: Secondary | ICD-10-CM | POA: Diagnosis present

## 2018-11-20 DIAGNOSIS — Z955 Presence of coronary angioplasty implant and graft: Secondary | ICD-10-CM

## 2018-11-20 DIAGNOSIS — R0789 Other chest pain: Secondary | ICD-10-CM | POA: Diagnosis present

## 2018-11-20 DIAGNOSIS — F419 Anxiety disorder, unspecified: Secondary | ICD-10-CM | POA: Diagnosis not present

## 2018-11-20 DIAGNOSIS — Z888 Allergy status to other drugs, medicaments and biological substances status: Secondary | ICD-10-CM

## 2018-11-20 DIAGNOSIS — Z9989 Dependence on other enabling machines and devices: Secondary | ICD-10-CM

## 2018-11-20 DIAGNOSIS — R079 Chest pain, unspecified: Secondary | ICD-10-CM | POA: Diagnosis present

## 2018-11-20 DIAGNOSIS — N179 Acute kidney failure, unspecified: Secondary | ICD-10-CM

## 2018-11-20 DIAGNOSIS — Z7951 Long term (current) use of inhaled steroids: Secondary | ICD-10-CM

## 2018-11-20 DIAGNOSIS — Z885 Allergy status to narcotic agent status: Secondary | ICD-10-CM

## 2018-11-20 LAB — CBC WITH DIFFERENTIAL/PLATELET
ABS IMMATURE GRANULOCYTES: 0 10*3/uL (ref 0.00–0.07)
BASOS ABS: 0 10*3/uL (ref 0.0–0.1)
Basophils Relative: 0 %
Eosinophils Absolute: 0.1 10*3/uL (ref 0.0–0.5)
Eosinophils Relative: 3 %
HEMATOCRIT: 41.3 % (ref 39.0–52.0)
Hemoglobin: 14.2 g/dL (ref 13.0–17.0)
IMMATURE GRANULOCYTES: 0 %
LYMPHS ABS: 1 10*3/uL (ref 0.7–4.0)
Lymphocytes Relative: 38 %
MCH: 30.7 pg (ref 26.0–34.0)
MCHC: 34.4 g/dL (ref 30.0–36.0)
MCV: 89.4 fL (ref 80.0–100.0)
MONOS PCT: 8 %
Monocytes Absolute: 0.2 10*3/uL (ref 0.1–1.0)
NEUTROS PCT: 51 %
NRBC: 0 % (ref 0.0–0.2)
Neutro Abs: 1.3 10*3/uL — ABNORMAL LOW (ref 1.7–7.7)
PLATELETS: 88 10*3/uL — AB (ref 150–400)
RBC: 4.62 MIL/uL (ref 4.22–5.81)
RDW: 12.9 % (ref 11.5–15.5)
WBC: 2.6 10*3/uL — ABNORMAL LOW (ref 4.0–10.5)

## 2018-11-20 LAB — BASIC METABOLIC PANEL
ANION GAP: 9 (ref 5–15)
BUN: 19 mg/dL (ref 6–20)
CHLORIDE: 106 mmol/L (ref 98–111)
CO2: 20 mmol/L — AB (ref 22–32)
Calcium: 8.6 mg/dL — ABNORMAL LOW (ref 8.9–10.3)
Creatinine, Ser: 1.28 mg/dL — ABNORMAL HIGH (ref 0.61–1.24)
GFR calc Af Amer: >60 mL/min (ref >60)
GFR calc non Af Amer: >60 mL/min (ref >60)
GLUCOSE: 359 mg/dL — AB (ref 70–99)
POTASSIUM: 4.2 mmol/L (ref 3.5–5.1)
Sodium: 135 mmol/L (ref 135–145)

## 2018-11-20 LAB — APTT: aPTT: 35 seconds (ref 24–36)

## 2018-11-20 LAB — PROTIME-INR
INR: 1.06
Prothrombin Time: 13.8 seconds (ref 11.4–15.2)

## 2018-11-20 LAB — TROPONIN I
Troponin I: <0.03 ng/mL (ref <0.03)
Troponin I: <0.03 ng/mL (ref <0.03)

## 2018-11-20 LAB — GLUCOSE, CAPILLARY: Glucose-Capillary: 187 mg/dL — ABNORMAL HIGH (ref 70–99)

## 2018-11-20 LAB — HEPARIN LEVEL (UNFRACTIONATED): Heparin Unfractionated: 0.48 IU/mL (ref 0.30–0.70)

## 2018-11-20 MED ORDER — INSULIN GLARGINE 100 UNIT/ML ~~LOC~~ SOLN
70.0000 [IU] | Freq: Every day | SUBCUTANEOUS | Status: DC
  Administered 2018-11-23: 70 [IU] via SUBCUTANEOUS
  Filled 2018-11-20 (×4): qty 0.7

## 2018-11-20 MED ORDER — HEPARIN BOLUS VIA INFUSION
4000.0000 [IU] | Freq: Once | INTRAVENOUS | Status: AC
  Administered 2018-11-20: 4000 [IU] via INTRAVENOUS

## 2018-11-20 MED ORDER — EZETIMIBE 10 MG PO TABS
10.0000 mg | ORAL_TABLET | Freq: Every day | ORAL | Status: DC
  Administered 2018-11-20 – 2018-11-22 (×3): 10 mg via ORAL
  Filled 2018-11-20 (×3): qty 1

## 2018-11-20 MED ORDER — ISOSORBIDE MONONITRATE ER 30 MG PO TB24
30.0000 mg | ORAL_TABLET | Freq: Every day | ORAL | Status: DC
  Administered 2018-11-20 – 2018-11-22 (×3): 30 mg via ORAL
  Filled 2018-11-20 (×3): qty 1

## 2018-11-20 MED ORDER — MELATONIN 3 MG PO TABS
9.0000 mg | ORAL_TABLET | Freq: Every day | ORAL | Status: DC
  Administered 2018-11-20 – 2018-11-22 (×3): 9 mg via ORAL
  Filled 2018-11-20 (×4): qty 3

## 2018-11-20 MED ORDER — PRAVASTATIN SODIUM 10 MG PO TABS
20.0000 mg | ORAL_TABLET | Freq: Every day | ORAL | Status: DC
  Administered 2018-11-20 – 2018-11-23 (×3): 20 mg via ORAL
  Filled 2018-11-20: qty 2
  Filled 2018-11-20 (×2): qty 1
  Filled 2018-11-20 (×2): qty 2

## 2018-11-20 MED ORDER — ONDANSETRON HCL 4 MG/2ML IJ SOLN
4.0000 mg | Freq: Four times a day (QID) | INTRAMUSCULAR | Status: DC | PRN
  Administered 2018-11-21: 4 mg via INTRAVENOUS
  Filled 2018-11-20: qty 2

## 2018-11-20 MED ORDER — AMLODIPINE BESYLATE 2.5 MG PO TABS
5.0000 mg | ORAL_TABLET | Freq: Every day | ORAL | Status: DC
  Administered 2018-11-20 – 2018-11-22 (×3): 5 mg via ORAL
  Filled 2018-11-20: qty 2
  Filled 2018-11-20: qty 1
  Filled 2018-11-20: qty 2

## 2018-11-20 MED ORDER — ACETAMINOPHEN 650 MG RE SUPP: 650.0000 mg | Freq: Four times a day (QID) | RECTAL | Status: DC | PRN

## 2018-11-20 MED ORDER — ACETAMINOPHEN 325 MG PO TABS
650.0000 mg | ORAL_TABLET | Freq: Once | ORAL | Status: AC
  Administered 2018-11-20: 650 mg via ORAL
  Filled 2018-11-20: qty 2

## 2018-11-20 MED ORDER — ACETAMINOPHEN 325 MG PO TABS
650.0000 mg | ORAL_TABLET | Freq: Four times a day (QID) | ORAL | Status: DC | PRN
  Administered 2018-11-21: 650 mg via ORAL
  Filled 2018-11-20: qty 2

## 2018-11-20 MED ORDER — METOPROLOL TARTRATE 50 MG PO TABS
50.0000 mg | ORAL_TABLET | Freq: Every day | ORAL | Status: DC
  Administered 2018-11-20 – 2018-11-22 (×3): 50 mg via ORAL
  Filled 2018-11-20 (×3): qty 1

## 2018-11-20 MED ORDER — CITALOPRAM HYDROBROMIDE 20 MG PO TABS
20.0000 mg | ORAL_TABLET | Freq: Every day | ORAL | Status: DC
  Administered 2018-11-20 – 2018-11-22 (×3): 20 mg via ORAL
  Filled 2018-11-20 (×3): qty 1

## 2018-11-20 MED ORDER — ASPIRIN EC 81 MG PO TBEC
81.0000 mg | DELAYED_RELEASE_TABLET | Freq: Every day | ORAL | Status: DC
  Administered 2018-11-22 – 2018-11-23 (×2): 81 mg via ORAL
  Filled 2018-11-20 (×3): qty 1

## 2018-11-20 MED ORDER — INSULIN ASPART 100 UNIT/ML ~~LOC~~ SOLN: 0.0000 [IU] | Freq: Three times a day (TID) | SUBCUTANEOUS | Status: DC

## 2018-11-20 MED ORDER — SODIUM CHLORIDE 0.9% FLUSH
3.0000 mL | Freq: Two times a day (BID) | INTRAVENOUS | Status: DC
  Administered 2018-11-20 – 2018-11-22 (×2): 3 mL via INTRAVENOUS

## 2018-11-20 MED ORDER — MOMETASONE FURO-FORMOTEROL FUM 200-5 MCG/ACT IN AERO
2.0000 | INHALATION_SPRAY | Freq: Two times a day (BID) | RESPIRATORY_TRACT | Status: DC
  Administered 2018-11-20 – 2018-11-23 (×5): 2 via RESPIRATORY_TRACT
  Filled 2018-11-20 (×3): qty 8.8

## 2018-11-20 MED ORDER — HEPARIN (PORCINE) 25000 UT/250ML-% IV SOLN
1250.0000 [IU]/h | INTRAVENOUS | Status: DC
  Administered 2018-11-20: 1150 [IU]/h via INTRAVENOUS
  Filled 2018-11-20 (×2): qty 250

## 2018-11-20 MED ORDER — SODIUM CHLORIDE 0.9 % IV SOLN
INTRAVENOUS | Status: AC
  Administered 2018-11-20 – 2018-11-21 (×2): via INTRAVENOUS

## 2018-11-20 MED ORDER — ASPIRIN 81 MG PO CHEW
324.0000 mg | CHEWABLE_TABLET | Freq: Once | ORAL | Status: AC
  Administered 2018-11-20: 324 mg via ORAL
  Filled 2018-11-20: qty 4

## 2018-11-20 MED ORDER — INSULIN ASPART 100 UNIT/ML ~~LOC~~ SOLN: 0.0000 [IU] | Freq: Every day | SUBCUTANEOUS | Status: DC

## 2018-11-20 MED ORDER — INSULIN DEGLUDEC 200 UNIT/ML ~~LOC~~ SOPN: 40.0000 [IU] | PEN_INJECTOR | Freq: Two times a day (BID) | SUBCUTANEOUS | Status: DC

## 2018-11-20 MED ORDER — INSULIN GLARGINE 100 UNIT/ML ~~LOC~~ SOLN
60.0000 [IU] | Freq: Every day | SUBCUTANEOUS | Status: DC
  Administered 2018-11-20 – 2018-11-22 (×3): 60 [IU] via SUBCUTANEOUS
  Filled 2018-11-20 (×4): qty 0.6

## 2018-11-20 MED ORDER — ONDANSETRON HCL 4 MG PO TABS: 4.0000 mg | ORAL_TABLET | Freq: Four times a day (QID) | ORAL | Status: DC | PRN

## 2018-11-20 MED ORDER — MELATONIN 5 MG PO TABS
10.0000 mg | ORAL_TABLET | Freq: Every day | ORAL | Status: DC
  Filled 2018-11-20: qty 2

## 2018-11-20 MED ORDER — NITROGLYCERIN 0.4 MG SL SUBL
0.4000 mg | SUBLINGUAL_TABLET | SUBLINGUAL | Status: DC | PRN
  Administered 2018-11-20 (×2): 0.4 mg via SUBLINGUAL
  Filled 2018-11-20: qty 1

## 2018-11-20 MED ORDER — MORPHINE SULFATE (PF) 4 MG/ML IV SOLN
4.0000 mg | INTRAVENOUS | Status: DC | PRN
  Administered 2018-11-20: 4 mg via INTRAVENOUS
  Filled 2018-11-20: qty 1

## 2018-11-20 MED ORDER — GABAPENTIN 300 MG PO CAPS
300.0000 mg | ORAL_CAPSULE | Freq: Three times a day (TID) | ORAL | Status: DC
  Administered 2018-11-20 – 2018-11-23 (×5): 300 mg via ORAL
  Filled 2018-11-20 (×6): qty 1

## 2018-11-20 MED ORDER — ALBUTEROL SULFATE (2.5 MG/3ML) 0.083% IN NEBU: 2.5000 mg | INHALATION_SOLUTION | Freq: Four times a day (QID) | RESPIRATORY_TRACT | Status: DC | PRN

## 2018-11-20 MED ORDER — INFLUENZA VAC SPLIT QUAD 0.5 ML IM SUSY
0.5000 mL | PREFILLED_SYRINGE | INTRAMUSCULAR | Status: AC
  Administered 2018-11-21: 0.5 mL via INTRAMUSCULAR
  Filled 2018-11-20: qty 0.5

## 2018-11-20 NOTE — Progress Notes (Signed)
Patient has been setup with nasal CPAP pressure of 11.

## 2018-11-20 NOTE — Progress Notes (Signed)
ANTICOAGULATION CONSULT NOTE - Follow Up Consult  Pharmacy Consult for heparin Indication: chest pain/ACS  Labs: Recent Labs    11/20/18 1520 11/20/18 2240  HGB 14.2  --   HCT 41.3  --   PLT 88*  --   APTT 35  --   LABPROT 13.8  --   INR 1.06  --   HEPARINUNFRC  --  0.48  CREATININE 1.28*  --   TROPONINI <0.03 <0.03    Assessment/Plan:  63yo male therapeutic on heparin with initial dosing for CP. Will continue gtt at current rate and confirm stable with am labs.   Wynona Neat, PharmD, BCPS  11/20/2018,11:51 PM

## 2018-11-20 NOTE — ED Provider Notes (Signed)
[facility] EMERGENCY DEPARTMENT Provider Note   CSN: [id number] Arrival date & time: 11/20/18  1501     History   Chief Complaint Chief Complaint  Patient presents with  . Chest Pain    HPI [name] [name] is a 63 y.o. male.  HPI  Pt was seen at 1510. Per pt and his family, c/o gradual onset and worsening of multiple intermittent episodes of chest "pains" for the past 2 weeks. Pt states he has been having left sided chest "pains" associated with diaphoresis, SOB and "dizziness" that occur on exertion for the past 2 weeks. Pt states the CP will radiate into his left shoulder/neck. Pt states when he rests the symptoms will improve after approximately 1 hour. Pt states he "sometimes" has been taking his SL ntg with "some" improvement. Pt states he experienced an episode approximately 1000 this morning which was "worse" than previous episodes. Pt's family states pt did not tell them of his symptoms until today when they saw him SOB and diaphoretic. Pt did not take ASA today. Denies back pain, no abd pain, no N/V/D, no palpitations, no cough, no fevers, no injury, no rash.    Past Medical History:  Diagnosis Date  . Anxiety   . Arthritis   . Asthma   . Cataract    Right eye  . CHF (congestive heart failure) ([place])   . Chronic lower back pain   . Coronary atherosclerosis of native coronary artery    a. LAD & OM stenting;  b. 2007 Cath: nonobs dzs, patent stents;  c. 07/2012 neg MV, EF 61%.  d. 11/20/14: [place] s/p  PCI w/ DES to pLAD and DES to OM1   . Essential hypertension, benign   . GERD (gastroesophageal reflux disease)   . Hearing loss of left ear   . History of gout   . History of hiatal hernia   . Hypercholesteremia   . Lumbar herniated disc   . MI, old   . Migraine   . OSA on CPAP   . 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 Thrombocytopenia ([place])   . TIA (transient ischemic attack) ~ 2010  . Type 2 diabetes  mellitus [facility])     Patient Active Problem List   Diagnosis Date Noted  . Aphasia 07/09/2018  . Cerebral thrombosis with cerebral infarction 07/09/2018  . Chest pain 08/10/2017  . 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 Coronary artery disease involving native coronary artery with unstable angina pectoris ([place])   . CAD -S/P PCI LAD OM/LAD '13 and 2016 12/31/2015  . Hyperlipidemia   . Chest pain with high risk of acute coronary syndrome 12/23/2015  . Precordial chest pain 12/03/2014  . TIA (transient ischemic attack)   . CAD- diffuse LAD disease at cath 12/30/15   . Thrombocytopenia ([place])   . Accelerating angina ([place]) 12/03/2013  . Type 2 diabetes mellitus ([place])   . Anxiety   . PVC's (premature ventricular contractions) 01/06/2013  . Obstructive sleep apnea-declines C-pap 07/04/2012  . Mixed hyperlipidemia 07/30/2009  . Essential hypertension, benign 07/30/2009    Past Surgical History:  Procedure Laterality Date  . ANTERIOR CERVICAL DECOMP/DISCECTOMY FUSION  1998   "C3-4"  . CARDIAC CATHETERIZATION  "several"  . CARDIAC CATHETERIZATION N/A 12/30/2015   Procedure: Left Heart Cath and Coronary Angiography;  Surgeon: [name], MD;  Location: [place] CV LAB;  Service: Cardiovascular;  Laterality: N/A;  . CARPAL TUNNEL RELEASE Bilateral 2005  . COLONOSCOPY    . CORONARY ANGIOPLASTY    . CORONARY ANGIOPLASTY WITH STENT PLACEMENT  2002; 2003; 11/20/2014   "I have 4 stents after today" (11/20/2014)  . CORONARY ARTERY BYPASS GRAFT N/A 01/09/2016   Procedure: CORONARY ARTERY BYPASS GRAFTING (CABG) X 3 UTILIZING RIGHT AND LEFT INTERNAL MAMMARY ARTERY AND ENDOSCOPICALLY HARVESTED SAPHENEOUS VEIN.;  Surgeon: Rexene Alberts, MD;  Location: [place];  Service: Open Heart Surgery;  Laterality: N/A;  . ESOPHAGOGASTRODUODENOSCOPY    . KNEE SURGERY Left 02/2012   "scraped; open"  . LEFT HEART CATH AND CORS/GRAFTS ANGIOGRAPHY N/A 08/09/2017   Procedure: LEFT HEART CATH AND CORS/GRAFTS ANGIOGRAPHY;   Surgeon: [name], MD;  Location: [place] CV LAB;  Service: Cardiovascular;  Laterality: N/A;  . LEFT HEART CATHETERIZATION WITH CORONARY ANGIOGRAM N/A 07/20/2012   Procedure: LEFT HEART CATHETERIZATION WITH CORONARY ANGIOGRAM;  Surgeon: [name], MD;  Location: [place] CATH LAB;  Service: Cardiovascular;  Laterality: N/A;  . LEFT HEART CATHETERIZATION WITH CORONARY ANGIOGRAM N/A 11/20/2014   Procedure: LEFT HEART CATHETERIZATION WITH CORONARY ANGIOGRAM;  Surgeon: [name], MD;  Location: [facility] CATH LAB;  Service: Cardiovascular;  Laterality: N/A;  . LEFT HEART CATHETERIZATION WITH CORONARY ANGIOGRAM N/A 11/26/2014   Procedure: LEFT HEART CATHETERIZATION WITH CORONARY ANGIOGRAM;  Surgeon: [name], MD;  Location: [facility] CATH LAB;  Service: Cardiovascular;  Laterality: N/A;  . NEUROPLASTY / TRANSPOSITION ULNAR NERVE AT ELBOW Right ~ 2012  . PERCUTANEOUS CORONARY ROTOBLATOR INTERVENTION (PCI-R)  11/20/2014   Procedure: PERCUTANEOUS CORONARY ROTOBLATOR INTERVENTION (PCI-R);  Surgeon: [name], MD;  Location: [facility] CATH LAB;  Service: Cardiovascular;;  . SHOULDER ARTHROSCOPY Left ~ 2011  . TEE WITHOUT CARDIOVERSION N/A 01/09/2016   Procedure: TRANSESOPHAGEAL ECHOCARDIOGRAM (TEE);  Surgeon: Rexene Alberts, MD;  Location: [place];  Service: Open Heart Surgery;  Laterality: N/A;        Home Medications    Prior to Admission medications   Medication Sig Start Date End Date Taking? Authorizing Provider  acetaminophen (TYLENOL) 500 MG tablet Take 1,000 mg by mouth every 6 (six) hours as needed (pain).    [provider]  albuterol (PROVENTIL HFA;VENTOLIN HFA) 108 (90 BASE) MCG/ACT inhaler Inhale 2 puffs into the lungs every 6 (six) hours as needed for wheezing or shortness of breath.    [provider]  amLODipine (NORVASC) 5 MG tablet Take 5 mg by mouth at bedtime. 06/16/18   [provider]  aspirin EC 81 MG tablet Take 81 mg by mouth at bedtime.      [provider]  cetirizine (ZYRTEC) 10 MG tablet Take 10 mg by mouth at bedtime.    [provider]  citalopram (CELEXA) 20 MG tablet Take 20 mg by mouth at bedtime.     [provider]  ezetimibe (ZETIA) 10 MG tablet Take 10 mg by mouth at bedtime. 07/05/18   [provider]  fluticasone (FLONASE) 50 MCG/ACT nasal spray Place 1 spray into the nose 2 (two) times daily.     [provider]  Fluticasone-Salmeterol (ADVAIR) 250-50 MCG/DOSE AEPB Inhale 1 puff into the lungs every 12 (twelve) hours.    [provider]  gabapentin (NEURONTIN) 300 MG capsule Take 300 mg by mouth 3 (three) times daily. 07/09/17   [provider]  insulin aspart (NOVOLOG FLEXPEN) 100 UNIT/ML FlexPen Inject 2-30 Units into the skin 3 (three) times daily  as needed for high blood sugar (CBG >150). Per sliding scale written down at home     [provider]  Insulin Degludec (TRESIBA FLEXTOUCH) 200 UNIT/ML SOPN Inject 60-70 Units into the skin See admin instructions. Inject 70 units subcutaneously every morning and 60 units at bedtime    [provider]  isosorbide mononitrate (IMDUR) 30 MG 24 hr tablet Take 30 mg by mouth at bedtime. 06/16/18   [provider]  Melatonin 5 MG TABS Take 10 mg by mouth at bedtime.     [provider]  metFORMIN (GLUCOPHAGE) 1000 MG tablet Take 1 tablet (1,000 mg total) by mouth 2 (two) times daily. 08/12/17   End, Harrell Gave, MD  metoprolol tartrate (LOPRESSOR) 50 MG tablet Take 50 mg by mouth at bedtime. 07/05/18   [provider]  nitroGLYCERIN (NITROSTAT) 0.4 MG SL tablet Place 1 tablet (0.4 mg total) under the tongue every 5 (five) minutes as needed for chest pain. 08/13/17 08/30/18  [name], MD  omeprazole (PRILOSEC) 20 MG capsule Take 20 mg by mouth 2 (two) times daily before a meal.    [provider]  pravastatin (PRAVACHOL) 20 MG tablet Take 1 tablet (20 mg total) by  mouth daily. 09/13/18   [name], MD  topiramate (TOPAMAX) 25 MG capsule Take 25 mg by mouth 2 (two) times daily.    [provider]  TRESIBA FLEXTOUCH 100 UNIT/ML SOPN FlexTouch Pen INJECT 60 UNITS INTO THE SKIN EVERY MORNING AND INJECT 61 UNITS INTO THE SKIN AT BEDTIME 08/11/18   [provider]    Family History Family History  Problem Relation Age of Onset  . Coronary artery disease Father 82    Social History Social History   Tobacco Use  . Smoking status: Never Smoker  . Smokeless tobacco: Never Used  Substance Use Topics  . Alcohol use: No  . Drug use: No     Allergies   Divalproex sodium; Imdur [isosorbide dinitrate]; Lipitor [atorvastatin calcium]; Statins; Tramadol; Tricor [fenofibrate]; and Valproic acid   Review of Systems Review of Systems ROS: Statement: All systems negative except as marked or noted in the HPI; Constitutional: Negative for fever and chills. ; ; Eyes: Negative for eye pain, redness and discharge. ; ; ENMT: Negative for ear pain, hoarseness, nasal congestion, sinus pressure and sore throat. ; ; Cardiovascular: +CP, SOB, diaphoresis. Negative for palpitations, and peripheral edema. ; ; Respiratory: Negative for cough, wheezing and stridor. ; ; Gastrointestinal: Negative for nausea, vomiting, diarrhea, abdominal pain, blood in stool, hematemesis, jaundice and rectal bleeding. . ; ; Genitourinary: Negative for dysuria, flank pain and hematuria. ; ; Musculoskeletal: Negative for back pain and neck pain. Negative for swelling and trauma.; ; Skin: Negative for pruritus, rash, abrasions, blisters, bruising and skin lesion.; ; Neuro: Negative for headache, lightheadedness and neck stiffness. Negative for weakness, altered level of consciousness, altered mental status, extremity weakness, paresthesias, involuntary movement, seizure and syncope.       Physical Exam Updated Vital Signs BP (!) 155/71 (BP Location: Right Arm)   Pulse 64    Temp 98.3 F (36.8 C) (Oral)   Resp 11   Ht 5' 9"  (1.753 m)   Wt 108.9 kg   SpO2 97%   BMI 35.44 kg/m    BP (!) 147/68   Pulse 64   Temp 98.3 F (36.8 C) (Oral)   Resp 17   Ht 5' 9"  (1.753 m)   Wt 108.9 kg   SpO2  91%   BMI 35.44 kg/m    Physical Exam 1515: Physical examination:  Nursing notes reviewed; Vital signs and O2 SAT reviewed;  Constitutional: Well developed, Well nourished, Well hydrated, In no acute distress; Head:  Normocephalic, atraumatic; Eyes: EOMI, PERRL, No scleral icterus; ENMT: Mouth and pharynx normal, Mucous membranes moist; Neck: Supple, Full range of motion, No lymphadenopathy; Cardiovascular: Regular rate and rhythm, No gallop; Respiratory: Breath sounds clear & equal bilaterally, No wheezes.  Speaking full sentences with ease, Normal respiratory effort/excursion; Chest: Nontender, Movement normal; Abdomen: Soft, Nontender, Nondistended, Normal bowel sounds; Genitourinary: No CVA tenderness; Extremities: Peripheral pulses normal, No tenderness, No edema, No calf edema or asymmetry.; Neuro: AA&Ox3, Major CN grossly intact.  Speech clear. No gross focal motor or sensory deficits in extremities.; Skin: Color normal, Warm, Dry.   ED Treatments / Results  Labs (all labs ordered are listed, but only abnormal results are displayed)   EKG EKG Interpretation  Date/Time:  Sunday November 20 2018 15:08:10 EST Ventricular Rate:  67 PR Interval:    QRS Duration: 87 QT Interval:  417 QTC Calculation: 441 R Axis:   20 Text Interpretation:  Sinus rhythm When compared with ECG of 08/12/2017 No significant change was found Confirmed by Francine Graven [phone]) on 11/20/2018 3:13:06 PM   Radiology   Procedures Procedures (including critical care time)  Medications Ordered in ED Medications  nitroGLYCERIN (NITROSTAT) SL tablet 0.4 mg (0.4 mg Sublingual Given 11/20/18 1629)  morphine 4 MG/ML injection 4 mg (has no administration in time range)  0.9 %  sodium  chloride infusion (has no administration in time range)  aspirin chewable tablet 324 mg (324 mg Oral Given 11/20/18 1528)  acetaminophen (TYLENOL) tablet 650 mg (650 mg Oral Given 11/20/18 1618)     Initial Impression / Assessment and Plan / ED Course  I have reviewed the triage vital signs and the nursing notes.  Pertinent labs & imaging results that were available during my care of the patient were reviewed by me and considered in my medical decision making (see chart for details).  MDM Reviewed: previous chart, nursing note and vitals Reviewed previous: labs and ECG Interpretation: labs, ECG and x-ray Total time providing critical care: 30-74 minutes. This excludes time spent performing separately reportable procedures and services. Consults: cardiology and admitting MD    CRITICAL CARE Performed by: Francine Graven Total critical care time: 35 minutes Critical care time was exclusive of separately billable procedures and treating other patients. Critical care was necessary to treat or prevent imminent or life-threatening deterioration. Critical care was time spent personally by me on the following activities: development of treatment plan with patient and/or surrogate as well as nursing, discussions with consultants, evaluation of patient's response to treatment, examination of patient, obtaining history from patient or surrogate, ordering and performing treatments and interventions, ordering and review of laboratory studies, ordering and review of radiographic studies, pulse oximetry and re-evaluation of patient's condition.   Results for orders placed or performed during the hospital encounter of 02/72/53  Basic metabolic panel  Result Value Ref Range   Sodium 135 135 - 145 mmol/L   Potassium 4.2 3.5 - 5.1 mmol/L   Chloride 106 98 - 111 mmol/L   CO2 20 (L) 22 - 32 mmol/L   Glucose, Bld 359 (H) 70 - 99 mg/dL   BUN 19 6 - 20 mg/dL   Creatinine, Ser 1.28 (H) 0.61 - 1.24 mg/dL     Calcium 8.6 (L) 8.9 - 10.3 mg/dL  GFR calc non Af Amer >60 >60 mL/min   GFR calc Af Amer >60 >60 mL/min   Anion gap 9 5 - 15  Troponin I - Once  Result Value Ref Range   Troponin I <0.03 <0.03 ng/mL  CBC with Differential  Result Value Ref Range   WBC 2.6 (L) 4.0 - 10.5 K/uL   RBC 4.62 4.22 - 5.81 MIL/uL   Hemoglobin 14.2 13.0 - 17.0 g/dL   HCT 41.3 39.0 - 52.0 %   MCV 89.4 80.0 - 100.0 fL   MCH 30.7 26.0 - 34.0 pg   MCHC 34.4 30.0 - 36.0 g/dL   RDW 12.9 11.5 - 15.5 %   Platelets 88 (L) 150 - 400 K/uL   nRBC 0.0 0.0 - 0.2 %   Neutrophils Relative % 51 %   Neutro Abs 1.3 (L) 1.7 - 7.7 K/uL   Lymphocytes Relative 38 %   Lymphs Abs 1.0 0.7 - 4.0 K/uL   Monocytes Relative 8 %   Monocytes Absolute 0.2 0.1 - 1.0 K/uL   Eosinophils Relative 3 %   Eosinophils Absolute 0.1 0.0 - 0.5 K/uL   Basophils Relative 0 %   Basophils Absolute 0.0 0.0 - 0.1 K/uL   Immature Granulocytes 0 %   Abs Immature Granulocytes 0.00 0.00 - 0.07 K/uL  Protime-INR  Result Value Ref Range   Prothrombin Time 13.8 11.4 - 15.2 seconds   INR 1.06    Dg Chest Port 1 View Result Date: 11/20/2018 CLINICAL DATA:  Chest pain EXAM: PORTABLE CHEST 1 VIEW COMPARISON:  10/12/2017 FINDINGS: 1226 hours. The lungs are clear without focal pneumonia, edema, pneumothorax or pleural effusion. The cardiopericardial silhouette is within normal limits for size. Status post CABG. The visualized bony structures of the thorax are intact. Telemetry leads overlie the chest. IMPRESSION: No active disease. Electronically Signed   By: Misty Stanley M.D.   On: 11/20/2018 15:58    1645:  Troponin negative and EKG unchanged from previous. ASA and SL ntg given with improvement of symptoms. Cr mildly elevated from previous; judicious IVF given. Concerning HPI, will need admit. T/C returned from [facility] Dr. Haroldine Laws, case discussed, including:  HPI, pertinent PM/SHx, VS/PE, dx testing, ED course and treatment:  Agrees with ED tx, hydrate  pt, CBG control, start IV heparin, OK to stay at [place] to cycle enzymes and have [facility] MD evaluate tomorrow morning for (most likely) transfer to [facility] for cardiac cath; transfer sooner prn.  1710:  T/C returned from [facility] Dr. Posey Pronto, case discussed, including:  HPI, pertinent PM/SHx, VS/PE, dx testing, ED course and treatment:  Agreeable to come to ED for evaluation to admit.        Final Clinical Impressions(s) / ED Diagnoses   Final diagnoses:  None    ED Discharge Orders    None       Francine Graven, DO 11/24/18 1156

## 2018-11-20 NOTE — ED Notes (Signed)
X-ray at bedside

## 2018-11-20 NOTE — H&P (Signed)
History and Physical    WAH SABIC YIR:[id number] DOB: 10-30-1956 DOA: 11/20/2018  PCP: Terrial Rhodes, MD  Patient coming from: Home  I have personally briefly reviewed patient's old medical records in [place]  Chief Complaint: Chest pain  HPI: [name] [name] is a 63 y.o. male with medical history significant for CAD s/p PCI and CABG, IDT2DM, HTN, HLD, TIA, and OSA (not using CPAP) who presents to the ED with chest pain.  Patient reports returning to work at [employer] which has involved heavy lifting.  He says since then he has noticed about 2 weeks of exertional left-sided chest pain described as a pressure-like sensation that radiates to his left shoulder and left neck but not his jaw or back.  During these episodes he has associated dyspnea on exertion, lightheadedness without syncope, and diaphoresis.  He has noted occasional palpitations.  He says his symptoms resolve with rest.  He denies any symptom onset while at rest.  He denies any associated nausea, vomiting, abdominal pain.  His family visited today convinced him to come to the ED for further evaluation once finding out about the symptoms he has been describing.  ED Course:  Initial vitals showed BP 147/68, pulse 64, RR 17, temp 98.3 Fahrenheit, SPO2 91-96% on room air.  Labs are notable for troponin I <0.03, creatinine 1.28 (0.86 on 07/10/2018), serum glucose 359, WBC 2.6 (chronic leukopenia going back to 2013), hemoglobin 14.2, platelets 88 (chronic thrombocytopenia with baseline ~90-110).  EKG showed sinus rhythm, rate 66 bpm, no acute ischemic changes.  Portable chest x-ray showed prior CABG changes without focal consolidation, edema, or effusion.  Patient was given aspirin 324 mg and sublingual nitroglycerin.  The ED physician, Dr. Thurnell Garbe, discussed the case with on-call cardiology Dr. Haroldine Laws who recommended starting IV heparin and continued management at [facility] for cardiology evaluation in a.m with potential  transfer to [facility] for cath or transfer sooner if needed.  Review of Systems: As per HPI otherwise 10 point review of systems negative.    Past Medical History:  Diagnosis Date  . Anxiety   . Arthritis   . Asthma   . Cataract    Right eye  . CHF (congestive heart failure) ([place])   . Chronic lower back pain   . Coronary atherosclerosis of native coronary artery    a. LAD & OM stenting;  b. 2007 Cath: nonobs dzs, patent stents;  c. 07/2012 neg MV, EF 61%.  d. 11/20/14: [place] s/p  PCI w/ DES to pLAD and DES to OM1   . Essential hypertension, benign   . GERD (gastroesophageal reflux disease)   . Hearing loss of left ear   . History of gout   . History of hiatal hernia   . Hypercholesteremia   . Lumbar herniated disc   . MI, old   . Migraine   . OSA on CPAP   . 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 Thrombocytopenia ([place])   . TIA (transient ischemic attack) ~ 2010  . Type 2 diabetes mellitus ([place])     Past Surgical History:  Procedure Laterality Date  . ANTERIOR CERVICAL DECOMP/DISCECTOMY FUSION  1998   "C3-4"  . CARDIAC CATHETERIZATION  "several"  . CARDIAC CATHETERIZATION N/A 12/30/2015   Procedure: Left Heart Cath and Coronary Angiography;  Surgeon: [name], MD;  Location: [place] CV LAB;  Service:  Cardiovascular;  Laterality: N/A;  . CARPAL TUNNEL RELEASE Bilateral 2005  . COLONOSCOPY    . CORONARY ANGIOPLASTY    . CORONARY ANGIOPLASTY WITH STENT PLACEMENT  2002; 2003; 11/20/2014   "I have 4 stents after today" (11/20/2014)  . CORONARY ARTERY BYPASS GRAFT N/A 01/09/2016   Procedure: CORONARY ARTERY BYPASS GRAFTING (CABG) X 3 UTILIZING RIGHT AND LEFT INTERNAL MAMMARY ARTERY AND ENDOSCOPICALLY HARVESTED SAPHENEOUS VEIN.;  Surgeon: Rexene Alberts, MD;  Location: [place];  Service: Open Heart Surgery;  Laterality: N/A;  . ESOPHAGOGASTRODUODENOSCOPY    . KNEE SURGERY Left 02/2012   "scraped; open"  . LEFT  HEART CATH AND CORS/GRAFTS ANGIOGRAPHY N/A 08/09/2017   Procedure: LEFT HEART CATH AND CORS/GRAFTS ANGIOGRAPHY;  Surgeon: [name], MD;  Location: [place] CV LAB;  Service: Cardiovascular;  Laterality: N/A;  . LEFT HEART CATHETERIZATION WITH CORONARY ANGIOGRAM N/A 07/20/2012   Procedure: LEFT HEART CATHETERIZATION WITH CORONARY ANGIOGRAM;  Surgeon: [name], MD;  Location: [place] CATH LAB;  Service: Cardiovascular;  Laterality: N/A;  . LEFT HEART CATHETERIZATION WITH CORONARY ANGIOGRAM N/A 11/20/2014   Procedure: LEFT HEART CATHETERIZATION WITH CORONARY ANGIOGRAM;  Surgeon: [name], MD;  Location: [facility] CATH LAB;  Service: Cardiovascular;  Laterality: N/A;  . LEFT HEART CATHETERIZATION WITH CORONARY ANGIOGRAM N/A 11/26/2014   Procedure: LEFT HEART CATHETERIZATION WITH CORONARY ANGIOGRAM;  Surgeon: [name], MD;  Location: [facility] CATH LAB;  Service: Cardiovascular;  Laterality: N/A;  . NEUROPLASTY / TRANSPOSITION ULNAR NERVE AT ELBOW Right ~ 2012  . PERCUTANEOUS CORONARY ROTOBLATOR INTERVENTION (PCI-R)  11/20/2014   Procedure: PERCUTANEOUS CORONARY ROTOBLATOR INTERVENTION (PCI-R);  Surgeon: [name], MD;  Location: [facility] CATH LAB;  Service: Cardiovascular;;  . SHOULDER ARTHROSCOPY Left ~ 2011  . TEE WITHOUT CARDIOVERSION N/A 01/09/2016   Procedure: TRANSESOPHAGEAL ECHOCARDIOGRAM (TEE);  Surgeon: Rexene Alberts, MD;  Location: [place];  Service: Open Heart Surgery;  Laterality: N/A;     reports that he has never smoked. He has never used smokeless tobacco. He reports that he does not drink alcohol or use drugs.  Allergies  Allergen Reactions  . Divalproex Sodium Other (See Comments)    Causes anger  . Imdur [Isosorbide Dinitrate] Other (See Comments)    Severe headache (trying lower dose 07/05/18)  . Lipitor [Atorvastatin Calcium]     cramps  . Statins Other (See Comments)    Muscle aches and cramps  . Tramadol Other (See Comments)    Chest pain   . Tricor [Fenofibrate]  Other (See Comments)    Leg cramps  . Valproic Acid Other (See Comments)    "anger"    Family History  Problem Relation Age of Onset  . Coronary artery disease Father 9     Prior to Admission medications   Medication Sig Start Date End Date Taking? Authorizing Provider  acetaminophen (TYLENOL) 500 MG tablet Take 1,000 mg by mouth every 6 (six) hours as needed (pain).   Yes [provider]  albuterol (PROVENTIL HFA;VENTOLIN HFA) 108 (90 BASE) MCG/ACT inhaler Inhale 2 puffs into the lungs every 6 (six) hours as needed for wheezing or shortness of breath.   Yes [provider]  amLODipine (NORVASC) 5 MG tablet Take 5 mg by mouth at bedtime. 06/16/18  Yes [provider]  aspirin EC 81 MG tablet Take 81 mg by mouth at bedtime.    Yes [provider]  cetirizine (ZYRTEC) 10 MG tablet Take 10 mg by mouth at bedtime.  Yes [provider]  citalopram (CELEXA) 20 MG tablet Take 20 mg by mouth at bedtime.    Yes [provider]  ezetimibe (ZETIA) 10 MG tablet Take 10 mg by mouth at bedtime. 07/05/18  Yes [provider]  fluticasone (FLONASE) 50 MCG/ACT nasal spray Place 1 spray into the nose 2 (two) times daily.    Yes [provider]  Fluticasone-Salmeterol (ADVAIR) 250-50 MCG/DOSE AEPB Inhale 1 puff into the lungs every 12 (twelve) hours.   Yes [provider]  gabapentin (NEURONTIN) 300 MG capsule Take 300 mg by mouth 3 (three) times daily. 07/09/17  Yes [provider]  insulin aspart (NOVOLOG FLEXPEN) 100 UNIT/ML FlexPen Inject 2-30 Units into the skin 3 (three) times daily as needed for high blood sugar (CBG >150). Per sliding scale written down at home    Yes [provider]  Insulin Degludec (TRESIBA FLEXTOUCH) 200 UNIT/ML SOPN Inject 60-70 Units into the skin See admin instructions. Inject 70 units subcutaneously every morning and 60 units at bedtime   Yes [provider]  isosorbide  mononitrate (IMDUR) 30 MG 24 hr tablet Take 30 mg by mouth at bedtime. 06/16/18  Yes [provider]  Melatonin 5 MG TABS Take 10 mg by mouth at bedtime.    Yes [provider]  metFORMIN (GLUCOPHAGE) 1000 MG tablet Take 1 tablet (1,000 mg total) by mouth 2 (two) times daily. 08/12/17  Yes End, Harrell Gave, MD  metoprolol tartrate (LOPRESSOR) 50 MG tablet Take 50 mg by mouth at bedtime. 07/05/18  Yes [provider]  omeprazole (PRILOSEC) 20 MG capsule Take 20 mg by mouth 2 (two) times daily before a meal.   Yes [provider]  pravastatin (PRAVACHOL) 20 MG tablet Take 1 tablet (20 mg total) by mouth daily. 09/13/18  Yes [name], MD  topiramate (TOPAMAX) 25 MG capsule Take 25 mg by mouth 2 (two) times daily.   Yes [provider]  nitroGLYCERIN (NITROSTAT) 0.4 MG SL tablet Place 1 tablet (0.4 mg total) under the tongue every 5 (five) minutes as needed for chest pain. 08/13/17 08/30/18  [name], MD  TRESIBA FLEXTOUCH 100 UNIT/ML SOPN FlexTouch Pen INJECT 60 UNITS INTO THE SKIN EVERY MORNING AND INJECT 70 UNITS INTO THE SKIN AT BEDTIME 08/11/18   [provider]    Physical Exam: Vitals:   11/20/18 1630 11/20/18 1645 11/20/18 1700 11/20/18 1730  BP: 132/68  140/70 (!) 155/76  Pulse: 67 64 66 63  Resp: 18 17 15 15   Temp:      TempSrc:      SpO2: 95% 94% 96% 96%  Weight:      Height:        Constitutional: NAD, calm, comfortable, resting supine in bed Eyes: PERRL, lids and conjunctivae normal ENMT: Mucous membranes are moist. Posterior pharynx clear of any exudate or lesions. Normal dentition.  Neck: normal, supple, no masses. Respiratory: clear to auscultation bilaterally, no wheezing, no crackles. Normal respiratory effort. No accessory muscle use.  Cardiovascular: Regular rate and rhythm, soft systolic murmur heard at the upper sternal borders. No extremity edema. Abdomen: no tenderness, no masses palpated. No  hepatosplenomegaly. Bowel sounds positive.  Musculoskeletal: no clubbing / cyanosis. No joint deformity upper and lower extremities. Good ROM, no contractures. Normal muscle tone.  Skin: no rashes, lesions, ulcers. No induration Neurologic: CN 2-12 grossly intact. Sensation intact, Strength 5/5 in all 4.  Psychiatric: Normal judgment and insight. Alert and oriented x 3.  Normal mood.     Labs on Admission: I have personally reviewed following labs and imaging studies  CBC: Recent Labs  Lab 11/20/18 1520  WBC 2.6*  NEUTROABS 1.3*  HGB 14.2  HCT 41.3  MCV 89.4  PLT 88*   Basic Metabolic Panel: Recent Labs  Lab 11/20/18 1520  NA 135  K 4.2  CL 106  CO2 20*  GLUCOSE 359*  BUN 19  CREATININE 1.28*  CALCIUM 8.6*   GFR: Estimated Creatinine Clearance: 74.7 mL/min (A) (by C-G formula based on SCr of 1.28 mg/dL (H)). Liver Function Tests: No results for input(s): AST, ALT, ALKPHOS, BILITOT, PROT, ALBUMIN in the last 168 hours. No results for input(s): LIPASE, AMYLASE in the last 168 hours. No results for input(s): AMMONIA in the last 168 hours. Coagulation Profile: Recent Labs  Lab 11/20/18 1520  INR 1.06   Cardiac Enzymes: Recent Labs  Lab 11/20/18 1520  TROPONINI <0.03   BNP (last 3 results) No results for input(s): PROBNP in the last 8760 hours. HbA1C: No results for input(s): HGBA1C in the last 72 hours. CBG: No results for input(s): GLUCAP in the last 168 hours. Lipid Profile: No results for input(s): CHOL, HDL, LDLCALC, TRIG, CHOLHDL, LDLDIRECT in the last 72 hours. Thyroid Function Tests: No results for input(s): TSH, T4TOTAL, FREET4, T3FREE, THYROIDAB in the last 72 hours. Anemia Panel: No results for input(s): VITAMINB12, FOLATE, FERRITIN, TIBC, IRON, RETICCTPCT in the last 72 hours. Urine analysis:    Component Value Date/Time   COLORURINE YELLOW 08/12/2017 1640   APPEARANCEUR CLEAR 08/12/2017 1640   LABSPEC 1.019 08/12/2017 1640   PHURINE 5.0  08/12/2017 1640   GLUCOSEU 50 (A) 08/12/2017 1640   HGBUR NEGATIVE 08/12/2017 1640   BILIRUBINUR NEGATIVE 08/12/2017 1640   KETONESUR NEGATIVE 08/12/2017 1640   PROTEINUR NEGATIVE 08/12/2017 1640   UROBILINOGEN 0.2 12/04/2013 0730   NITRITE NEGATIVE 08/12/2017 1640   LEUKOCYTESUR NEGATIVE 08/12/2017 1640    Radiological Exams on Admission: Dg Chest Port 1 View  Result Date: 11/20/2018 CLINICAL DATA:  Chest pain EXAM: PORTABLE CHEST 1 VIEW COMPARISON:  10/12/2017 FINDINGS: 1226 hours. The lungs are clear without focal pneumonia, edema, pneumothorax or pleural effusion. The cardiopericardial silhouette is within normal limits for size. Status post CABG. The visualized bony structures of the thorax are intact. Telemetry leads overlie the chest. IMPRESSION: No active disease. Electronically Signed   By: Misty Stanley M.D.   On: 11/20/2018 15:58    EKG: Independently reviewed. Sinus rhythm, rate 66 bpm, no acute ischemic changes.  Assessment/Plan Principal Problem:   Coronary artery disease involving native coronary artery with unstable angina pectoris ([place]) Active Problems:   Mixed hyperlipidemia   Essential hypertension, benign   Obstructive sleep apnea-declines C-pap   Type 2 diabetes mellitus ([place])   Anxiety   Chest pain   Acute kidney injury (HCC)   [name] [name] is a 63 y.o. male with medical history significant for CAD s/p PCI and CABG, IDT2DM, HTN, HLD, TIA, and OSA (not using CPAP) who presents to the ED with chest pain consistent with exertional angina.  Exertional angina w/ hx of CAD s/p PCI & CABG: LHC 08/09/17 showed severe native CAD with widely patent LIMA to LAD and SVG to RPDA.  Was continued on aggressive medical therapy and secondary prevention as there were no interventional targets at that time. -Admit to telemetry -Cycle troponins, obtain EKG as needed for chest pain -Continue IV heparin gtt, monitor closely for signs/symptoms of bleeding given  chronic  thrombocytopenia -Obtain echocardiogram -Continue aspirin, as needed NTG -Continue Lopressor, Imdur, pravastatin, Zetia -Cardiology consultation in the a.m.  Acute kidney injury: Creatinine mildly elevated at 1.28. -Continue maintenance IV fluids overnight -Recheck renal function in a.m.  Insulin-dependent type 2 diabetes: Reports taking insulin degludec 40 units twice a day (prescribed 60 a.m., 70 p.m.) and metformin 1000 mg twice daily at home.  He has not taken his insulin day of admission. -Last A1c 7.11 June 2018, will recheck -Insulin degludec 40 units twice a day, moderate SSI w/ HS correction -Holding metformin  Hypertension: Blood pressure mildly elevated on admission. -Continue home Lopressor, Imdur, amlodipine  Hyperlipidemia: -Continue pravastatin and Zetia  Hx of TIA: -Continue aspirin, pravastatin, Zetia  Chronic leukopenia/thrombocytopenia: Appears to be chronic since at least 2013.  Clear if has been worked up as an outpatient. -Monitor closely while on heparin  Asthma: No active wheezing on exam. -Dulera BID, albuterol nebulizer as needed  OSA: Patient states he is not using CPAP as he does not have equipment at home. -Will try CPAP nightly while admitted  Anxiety: -Continue home citalopram   DVT prophylaxis: Heparin gtt Code Status: Full code Family Communication: Discussed with patient and wife at bedside Disposition Plan: Pending further cardiac work-up Consults called: EDP discussed with cardiology, Dr. Haroldine Laws, by phone, will need formal [facility] cardiology consultation in a.m. Admission status: Inpatient   Zada Finders MD [facility] Pager [phone]  If 7PM-7AM, please contact night-coverage [email or web address] Password [facility]  11/20/2018, 6:11 PM

## 2018-11-20 NOTE — ED Triage Notes (Addendum)
Pt c/o CP intermittently x 2 weeks. States around 1000 this morning pain intensified. Pt c/o LT sided CP that radiates to LT shoulder and neck. Family states when CP began today, he was SOB, diaphoretic, pale, and dizzy. No nitro or ASA PTA.

## 2018-11-20 NOTE — Progress Notes (Signed)
ANTICOAGULATION CONSULT NOTE - Initial Consult  Pharmacy Consult for Heparin Indication: chest pain/ACS  Allergies  Allergen Reactions  . Divalproex Sodium Other (See Comments)    Causes anger  . Imdur [Isosorbide Dinitrate] Other (See Comments)    Severe headache (trying lower dose 07/05/18)  . Lipitor [Atorvastatin Calcium]     cramps  . Statins Other (See Comments)    Muscle aches and cramps  . Tramadol Other (See Comments)    Chest pain   . Tricor [Fenofibrate] Other (See Comments)    Leg cramps  . Valproic Acid Other (See Comments)    "anger"    Patient Measurements: Height: 5' 9"  (175.3 cm) Weight: 240 lb (108.9 kg) IBW/kg (Calculated) : 70.7 HEPARIN DW (KG): 94.5  Vital Signs: Temp: 98.3 F (36.8 C) (01/12 1511) Temp Source: Oral (01/12 1511) BP: 147/68 (01/12 1600) Pulse Rate: 64 (01/12 1600)  Labs: Recent Labs    11/20/18 1520  HGB 14.2  HCT 41.3  PLT 88*  LABPROT 13.8  INR 1.06  CREATININE 1.28*  TROPONINI <0.03    Estimated Creatinine Clearance: 74.7 mL/min (A) (by C-G formula based on SCr of 1.28 mg/dL (H)).   Medical History: Past Medical History:  Diagnosis Date  . Anxiety   . Arthritis   . Asthma   . Cataract    Right eye  . CHF (congestive heart failure) ([place])   . Chronic lower back pain   . Coronary atherosclerosis of native coronary artery    a. LAD & OM stenting;  b. 2007 Cath: nonobs dzs, patent stents;  c. 07/2012 neg MV, EF 61%.  d. 11/20/14: [place] s/p  PCI w/ DES to pLAD and DES to OM1   . Essential hypertension, benign   . GERD (gastroesophageal reflux disease)   . Hearing loss of left ear   . History of gout   . History of hiatal hernia   . Hypercholesteremia   . Lumbar herniated disc   . MI, old   . Migraine   . OSA on CPAP   . 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 Thrombocytopenia ([place])   . TIA (transient ischemic attack) ~ 2010  . Type 2 diabetes  mellitus (HCC)     Medications:  See med rec  Assessment: Patient presented to ED with chest pain. Initial troponin is normal. Pt states he has been having left sided chest "pains" associated with diaphoresis, SOB and "dizziness" that occur on exertion for the past 2 weeks. Pt states the CP will radiate into his left shoulder/neck.Has been taking his SL ntg with "some" improvement. Pharmacy asked to start heparin  Goal of Therapy:  Heparin level 0.3-0.7 units/ml Monitor platelets by anticoagulation protocol: Yes   Plan:  Give 4000 units bolus x 1 Start heparin infusion at 1150 units/hr Check anti-Xa level in ~6-8 hours and daily while on heparin Continue to monitor H&H and platelets  Isac Sarna, BS [place], BCPS Clinical Pharmacist Pager [phone] 11/20/2018,5:01 PM

## 2018-11-21 ENCOUNTER — Inpatient Hospital Stay (HOSPITAL_COMMUNITY): Payer: Medicare Other

## 2018-11-21 DIAGNOSIS — F419 Anxiety disorder, unspecified: Secondary | ICD-10-CM

## 2018-11-21 DIAGNOSIS — D709 Neutropenia, unspecified: Secondary | ICD-10-CM

## 2018-11-21 DIAGNOSIS — R079 Chest pain, unspecified: Secondary | ICD-10-CM

## 2018-11-21 DIAGNOSIS — E1100 Type 2 diabetes mellitus with hyperosmolarity without nonketotic hyperglycemic-hyperosmolar coma (NKHHC): Secondary | ICD-10-CM

## 2018-11-21 DIAGNOSIS — G4733 Obstructive sleep apnea (adult) (pediatric): Secondary | ICD-10-CM

## 2018-11-21 DIAGNOSIS — N179 Acute kidney failure, unspecified: Secondary | ICD-10-CM

## 2018-11-21 DIAGNOSIS — E782 Mixed hyperlipidemia: Secondary | ICD-10-CM

## 2018-11-21 DIAGNOSIS — I200 Unstable angina: Secondary | ICD-10-CM

## 2018-11-21 DIAGNOSIS — D696 Thrombocytopenia, unspecified: Secondary | ICD-10-CM

## 2018-11-21 DIAGNOSIS — I25110 Atherosclerotic heart disease of native coronary artery with unstable angina pectoris: Principal | ICD-10-CM

## 2018-11-21 LAB — HEPARIN LEVEL (UNFRACTIONATED)
Heparin Unfractionated: 0.15 IU/mL — ABNORMAL LOW (ref 0.30–0.70)
Heparin Unfractionated: 0.94 IU/mL — ABNORMAL HIGH (ref 0.30–0.70)
Heparin Unfractionated: 0.75 IU/mL — ABNORMAL HIGH (ref 0.30–0.70)

## 2018-11-21 LAB — CBC
HEMATOCRIT: 39 % (ref 39.0–52.0)
Hemoglobin: 13.1 g/dL (ref 13.0–17.0)
MCH: 30.5 pg (ref 26.0–34.0)
MCHC: 33.6 g/dL (ref 30.0–36.0)
MCV: 90.9 fL (ref 80.0–100.0)
NRBC: 0 % (ref 0.0–0.2)
Platelets: 73 10*3/uL — ABNORMAL LOW (ref 150–400)
RBC: 4.29 MIL/uL (ref 4.22–5.81)
RDW: 13.2 % (ref 11.5–15.5)
WBC: 2.5 10*3/uL — ABNORMAL LOW (ref 4.0–10.5)

## 2018-11-21 LAB — BASIC METABOLIC PANEL
Anion gap: 8 (ref 5–15)
BUN: 19 mg/dL (ref 6–20)
CALCIUM: 8.2 mg/dL — AB (ref 8.9–10.3)
CO2: 21 mmol/L — ABNORMAL LOW (ref 22–32)
Chloride: 108 mmol/L (ref 98–111)
Creatinine, Ser: 0.99 mg/dL (ref 0.61–1.24)
GFR calc Af Amer: >60 mL/min (ref >60)
GFR calc non Af Amer: >60 mL/min (ref >60)
Glucose, Bld: 246 mg/dL — ABNORMAL HIGH (ref 70–99)
Potassium: 3.9 mmol/L (ref 3.5–5.1)
Sodium: 137 mmol/L (ref 135–145)

## 2018-11-21 LAB — HEPATIC FUNCTION PANEL
ALT: 33 U/L (ref 0–44)
AST: 32 U/L (ref 15–41)
Albumin: 3.2 g/dL — ABNORMAL LOW (ref 3.5–5.0)
Alkaline Phosphatase: 38 U/L (ref 38–126)
Bilirubin, Direct: 0.1 mg/dL (ref 0.0–0.2)
Indirect Bilirubin: 0.4 mg/dL (ref 0.3–0.9)
Total Bilirubin: 0.5 mg/dL (ref 0.3–1.2)
Total Protein: 6.2 g/dL — ABNORMAL LOW (ref 6.5–8.1)

## 2018-11-21 LAB — GLUCOSE, CAPILLARY
Glucose-Capillary: 157 mg/dL — ABNORMAL HIGH (ref 70–99)
Glucose-Capillary: 136 mg/dL — ABNORMAL HIGH (ref 70–99)
Glucose-Capillary: 106 mg/dL — ABNORMAL HIGH (ref 70–99)
Glucose-Capillary: 175 mg/dL — ABNORMAL HIGH (ref 70–99)
Glucose-Capillary: 161 mg/dL — ABNORMAL HIGH (ref 70–99)

## 2018-11-21 LAB — ECHOCARDIOGRAM COMPLETE
Height: 69 in
Weight: 3840 oz

## 2018-11-21 LAB — HEMOGLOBIN A1C
Hgb A1c MFr Bld: 7.4 % — ABNORMAL HIGH (ref 4.8–5.6)
Mean Plasma Glucose: 165.68 mg/dL

## 2018-11-21 LAB — TROPONIN I: Troponin I: <0.03 ng/mL (ref <0.03)

## 2018-11-21 MED ORDER — BUTALBITAL-APAP-CAFFEINE 50-325-40 MG PO TABS
1.0000 | ORAL_TABLET | Freq: Once | ORAL | Status: AC
  Administered 2018-11-21: 1 via ORAL
  Filled 2018-11-21: qty 1

## 2018-11-21 MED ORDER — PERFLUTREN LIPID MICROSPHERE
1.0000 mL | INTRAVENOUS | Status: AC | PRN
  Administered 2018-11-21: 1 mL via INTRAVENOUS
  Administered 2018-11-21: 2 mL via INTRAVENOUS
  Filled 2018-11-21: qty 10

## 2018-11-21 MED ORDER — HEPARIN (PORCINE) 25000 UT/250ML-% IV SOLN
1150.0000 [IU]/h | INTRAVENOUS | Status: DC
  Administered 2018-11-22: 1150 [IU]/h via INTRAVENOUS
  Filled 2018-11-21: qty 250

## 2018-11-21 MED ORDER — HEPARIN BOLUS VIA INFUSION
2500.0000 [IU] | Freq: Once | INTRAVENOUS | Status: AC
  Administered 2018-11-21: 2500 [IU]/h via INTRAVENOUS
  Filled 2018-11-21: qty 2500

## 2018-11-21 NOTE — Plan of Care (Signed)

## 2018-11-21 NOTE — Progress Notes (Signed)
Patient is being transferred to [facility] via [facility]. Called to give report to [facility]link and receiving floor at [facility]. Awaiting for carelink to arrive. Will continue to monitor patient.

## 2018-11-21 NOTE — Consult Note (Signed)
Cardiology Consultation:   Patient ID: [name] [name]; [id number]; [birth year]   Admit date: 11/20/2018 Date of Consult: 11/21/2018  Primary Care Provider: Terrial Rhodes, MD Primary Cardiologist: Dr. [name]   Patient Profile:   [name] [name] is a 63 y.o. male with a history of CAD status post previous PCI and ultimately CABG in 2017 with LIMA to LAD, SVG to PDA, and free radial to OM1, recurrent atypical chest pain, hypertension, GERD, hyperlipidemia, OSA on CPAP, chronic thrombocytopenia, type 2 diabetes mellitus, and previous TIA who is being seen today for the evaluation of progressive dyspnea on exertion and chest pain at the request of Dr. Cruzita Lederer.  History of Present Illness:   [name] [name] presents to the hospital at the urging of his wife reporting at least a 2-week history of increasing dyspnea on exertion with recurring left upper chest and neck discomfort.  [name] [name] started working at [employer] a few weeks ago and states that the symptoms have been worse since [name] [name] started working.  [name] [name] reports compliance with his medications.  I last saw him in the office back in October 2018 following cardiac catheterization that had been done for evaluation of progressive angina symptoms.  This procedure revealed patent bypass grafts including LIMA to LAD, SVG to PDA, and free radial to OM1 with 40% ostial stenosis noted.  [name] [name] did have progressive distal LAD disease however this was felt to be best managed medically.  [name] [name] has no symptoms at rest under observation, troponin I levels have been negative.  Blood pressure and heart rate are well controlled on current medical regimen.  ECG shows no acute ST segment changes.  Past Medical History:  Diagnosis Date  . Anxiety   . Arthritis   . Asthma   . Cataract    Right eye  . CHF (congestive heart failure) ([place])   . Chronic lower back pain   . Coronary atherosclerosis of native coronary artery    a. LAD & OM stenting;  b. 2007 Cath: nonobs  dzs, patent stents;  c. 07/2012 neg MV, EF 61%.  d. 11/20/14: [place] s/p  PCI w/ DES to pLAD and DES to OM1   . Essential hypertension, benign   . GERD (gastroesophageal reflux disease)   . Hearing loss of left ear   . History of gout   . History of hiatal hernia   . Hypercholesteremia   . Lumbar herniated disc   . MI, old   . Migraine   . OSA on CPAP   . 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 Thrombocytopenia ([employer])   . TIA (transient ischemic attack) ~ 2010  . Type 2 diabetes mellitus ([place])     Past Surgical History:  Procedure Laterality Date  . ANTERIOR CERVICAL DECOMP/DISCECTOMY FUSION  1998   "C3-4"  . CARDIAC CATHETERIZATION  "several"  . CARDIAC CATHETERIZATION N/A 12/30/2015   Procedure: Left Heart Cath and Coronary Angiography;  Surgeon: [name], MD;  Location: [facility] [facility];  Service: Cardiovascular;  Laterality: N/A;  . CARPAL TUNNEL RELEASE Bilateral 2005  . COLONOSCOPY    . CORONARY ANGIOPLASTY    . CORONARY ANGIOPLASTY WITH STENT PLACEMENT  2002; 2003; 11/20/2014   "I have 4 stents after today" (11/20/2014)  . CORONARY ARTERY BYPASS GRAFT N/A 01/09/2016   Procedure: CORONARY ARTERY BYPASS GRAFTING (CABG) X 3 UTILIZING RIGHT AND LEFT  INTERNAL MAMMARY ARTERY AND ENDOSCOPICALLY HARVESTED SAPHENEOUS VEIN.;  Surgeon: Rexene Alberts, MD;  Location: [facility];  Service: Open Heart Surgery;  Laterality: N/A;  . ESOPHAGOGASTRODUODENOSCOPY    . KNEE SURGERY Left 02/2012   "scraped; open"  . LEFT HEART CATH AND CORS/GRAFTS ANGIOGRAPHY N/A 08/09/2017   Procedure: LEFT HEART CATH AND CORS/GRAFTS ANGIOGRAPHY;  Surgeon: [name], MD;  Location: [facility];  Service: Cardiovascular;  Laterality: N/A;  . LEFT HEART CATHETERIZATION WITH CORONARY ANGIOGRAM N/A 07/20/2012   Procedure: LEFT HEART CATHETERIZATION WITH CORONARY ANGIOGRAM;  Surgeon: [name], MD;  Location: [facility] CATH LAB;  Service:  Cardiovascular;  Laterality: N/A;  . LEFT HEART CATHETERIZATION WITH CORONARY ANGIOGRAM N/A 11/20/2014   Procedure: LEFT HEART CATHETERIZATION WITH CORONARY ANGIOGRAM;  Surgeon: [name], MD;  Location: [facility] CATH LAB;  Service: Cardiovascular;  Laterality: N/A;  . LEFT HEART CATHETERIZATION WITH CORONARY ANGIOGRAM N/A 11/26/2014   Procedure: LEFT HEART CATHETERIZATION WITH CORONARY ANGIOGRAM;  Surgeon: [name], MD;  Location: [facility] CATH LAB;  Service: Cardiovascular;  Laterality: N/A;  . NEUROPLASTY / TRANSPOSITION ULNAR NERVE AT ELBOW Right ~ 2012  . PERCUTANEOUS CORONARY ROTOBLATOR INTERVENTION (PCI-R)  11/20/2014   Procedure: PERCUTANEOUS CORONARY ROTOBLATOR INTERVENTION (PCI-R);  Surgeon: [name], MD;  Location: [facility] CATH LAB;  Service: Cardiovascular;;  . SHOULDER ARTHROSCOPY Left ~ 2011  . TEE WITHOUT CARDIOVERSION N/A 01/09/2016   Procedure: TRANSESOPHAGEAL ECHOCARDIOGRAM (TEE);  Surgeon: Rexene Alberts, MD;  Location: [facility];  Service: Open Heart Surgery;  Laterality: N/A;     Inpatient Medications: Scheduled Meds: . amLODipine  5 mg Oral QHS  . aspirin EC  81 mg Oral Daily  . citalopram  20 mg Oral QHS  . ezetimibe  10 mg Oral QHS  . gabapentin  300 mg Oral TID  . Influenza vac split quadrivalent PF  0.5 mL Intramuscular Tomorrow-1000  . insulin aspart  0-15 Units Subcutaneous TID WC  . insulin aspart  0-5 Units Subcutaneous QHS  . insulin glargine  60 Units Subcutaneous QHS  . insulin glargine  70 Units Subcutaneous Daily  . isosorbide mononitrate  30 mg Oral QHS  . Melatonin  9 mg Oral QHS  . metoprolol tartrate  50 mg Oral QHS  . mometasone-formoterol  2 puff Inhalation BID  . pravastatin  20 mg Oral Daily  . sodium chloride flush  3 mL Intravenous Q12H   Continuous Infusions: . heparin 1,450 Units/hr (11/21/18 0408)   PRN Meds: acetaminophen **OR** acetaminophen, albuterol, morphine injection, nitroGLYCERIN, ondansetron **OR** ondansetron (ZOFRAN)  IV  Allergies:    Allergies  Allergen Reactions  . Divalproex Sodium Other (See Comments)    Causes anger  . Imdur [Isosorbide Dinitrate] Other (See Comments)    Severe headache (trying lower dose 07/05/18)  . Lipitor [Atorvastatin Calcium]     cramps  . Statins Other (See Comments)    Muscle aches and cramps  . Tramadol Other (See Comments)    Chest pain   . Tricor [Fenofibrate] Other (See Comments)    Leg cramps  . Valproic Acid Other (See Comments)    "anger"    Social History:   Social History   Socioeconomic History  . Marital status: Married    Spouse name: [name]  . Number of children: 3  . Years of education: 55 th   . Highest education level: Not on file  Occupational History  . Occupation: Unemployed  Social Needs  . Financial resource strain:  Not on file  . Food insecurity:    Worry: Not on file    Inability: Not on file  . Transportation needs:    Medical: Not on file    Non-medical: Not on file  Tobacco Use  . Smoking status: Never Smoker  . Smokeless tobacco: Never Used  Substance and Sexual Activity  . Alcohol use: No  . Drug use: No  . Sexual activity: Yes  Lifestyle  . Physical activity:    Days per week: Not on file    Minutes per session: Not on file  . Stress: Not on file  Relationships  . Social connections:    Talks on phone: Not on file    Gets together: Not on file    Attends religious service: Not on file    Active member of club or organization: Not on file    Attends meetings of clubs or organizations: Not on file    Relationship status: Not on file  . Intimate partner violence:    Fear of current or ex partner: Not on file    Emotionally abused: Not on file    Physically abused: Not on file    Forced sexual activity: Not on file  Other Topics Concern  . Not on file  Social History Narrative   Patient lives at home with wife [name].   Patient works at [employer], Engineer, petroleum.   Patient has a 12 th grade  education.    Patient has 3 children.        Family History:   The patient's family history includes Coronary artery disease (age of onset: 2) in his father.  ROS:  Please see the history of present illness.  Fatigue, reflux.  All other ROS reviewed and negative.     Physical Exam/Data:   Vitals:   11/20/18 2213 11/20/18 2321 11/21/18 0523 11/21/18 0816  BP:   115/62   Pulse:  63 (!) 58   Resp:  16    Temp:      TempSrc:      SpO2: 98% 97% 97% 96%  Weight:      Height:        Intake/Output Summary (Last 24 hours) at 11/21/2018 0948 Last data filed at 11/21/2018 0900 Gross per 24 hour  Intake 1410.04 ml  Output -  Net 1410.04 ml   Filed Weights   11/20/18 1507  Weight: 108.9 kg   Body mass index is 35.44 kg/m.   Gen: Obese male, no distress. HEENT: Conjunctiva and lids normal, oropharynx clear. Neck: Supple, no elevated JVP or carotid bruits, no thyromegaly. Lungs: Clear to auscultation, nonlabored breathing at rest. Cardiac: Regular rate and rhythm, no S3 or significant systolic murmur, no pericardial rub. Abdomen: Soft, nontender, bowel sounds present. Extremities: Trace ankle edema, distal pulses 2+. Skin: Warm and dry. Musculoskeletal: No kyphosis. Neuropsychiatric: Alert and oriented x3, affect grossly appropriate.  EKG:  I personally reviewed the tracing from 11/20/2018 which showed normal sinus rhythm.  Telemetry:  I personally reviewed telemetry which shows sinus rhythm.  Relevant CV Studies:  Echocardiogram 07/09/2018: Study Conclusions  - Left ventricle: The cavity size was normal. Wall thickness was   increased in a pattern of mild LVH. Systolic function was normal.   The estimated ejection fraction was in the range of 60% to 65%.   Wall motion was normal; there were no regional wall motion   abnormalities. Left ventricular diastolic function parameters   were normal for the patient&'s  age. - Aortic valve: Mildly calcified annulus. Trileaflet;  mildly   calcified leaflets. There was mild regurgitation. - Mitral valve: There was trivial regurgitation. - Atrial septum: No defect or patent foramen ovale was identified. - Tricuspid valve: There was trivial regurgitation. - Pericardium, extracardiac: There was no pericardial effusion.  Cardiac catheterization 08/09/2017: Conclusions: 1. Severe native coronary artery disease, as detailed below. Most likely culprit for the patient's symptoms is progression of severe diffuse distal LAD supplied by the LIMA. 2. Widely patent LIMA to LAD and SVG to RPDA. Free radial to inferior branch of OM1 is patent with approximately 40% ostial stenosis. 3. Mildly elevated left ventricular filling pressure.  Recommendations: 1. Aggressive medical therapy and secondary prevention. There are no interventional targets at this time. 2. Consider gentle diuresis, given elevated left ventricular filling pressure.  Laboratory Data:  Chemistry Recent Labs  Lab 11/20/18 1520 11/21/18 0250  NA 135 137  K 4.2 3.9  CL 106 108  CO2 20* 21*  GLUCOSE 359* 246*  BUN 19 19  CREATININE 1.28* 0.99  CALCIUM 8.6* 8.2*  GFRNONAA >60 >60  GFRAA >60 >60  ANIONGAP 9 8    No results for input(s): PROT, ALBUMIN, AST, ALT, ALKPHOS, BILITOT in the last 168 hours. Hematology Recent Labs  Lab 11/20/18 1520 11/21/18 0250  WBC 2.6* 2.5*  RBC 4.62 4.29  HGB 14.2 13.1  HCT 41.3 39.0  MCV 89.4 90.9  MCH 30.7 30.5  MCHC 34.4 33.6  RDW 12.9 13.2  PLT 88* 73*   Cardiac Enzymes Recent Labs  Lab 11/20/18 1520 11/20/18 2240 11/21/18 0250  TROPONINI <0.03 <0.03 <0.03   No results for input(s): TROPIPOC in the last 168 hours.   Radiology/Studies:  Dg Chest Port 1 View  Result Date: 11/20/2018 CLINICAL DATA:  Chest pain EXAM: PORTABLE CHEST 1 VIEW COMPARISON:  10/12/2017 FINDINGS: 1226 hours. The lungs are clear without focal pneumonia, edema, pneumothorax or pleural effusion. The cardiopericardial silhouette is  within normal limits for size. Status post CABG. The visualized bony structures of the thorax are intact. Telemetry leads overlie the chest. IMPRESSION: No active disease. Electronically Signed   By: Misty Stanley M.D.   On: 11/20/2018 15:58    Assessment and Plan:   1.  Progressive dyspnea on exertion and chest pain concerning for angina in a patient with known multivessel CAD status post CABG.  [name] [name] had patent bypass grafts as of October 2018 although progressive distal LAD disease that was felt to be best managed medically.  [name] [name] states that the symptoms are worse over baseline within the last few weeks.  Cardiac markers argue against ACS.  ECG shows no acute acute ST segment changes.  Medical therapy is reasonable at baseline.  2.  Multivessel CAD status post CABG with LIMA to LAD, SVG to PDA, and free radial to OM1.  LVEF 60 to 65%.  3.  Thrombocytopenia which is chronic and leukopenia which looks to be more recent in since 2018.  It does not appear that this has been worked up based on review of the chart.  His ANC is 1.3 based on most recent smear.  4.  Mixed hyperlipidemia Oertli on Pravachol and Zetia.  LDL was 38 in August 2019.  5.  Essential hypertension, blood pressure well controlled today.  [name] [name] is on Norvasc, Lopressor, and Imdur.  6.  Type 2 diabetes mellitus, on insulin.  Discussed with the patient and his wife as well as hospitalist team.  Plan at this time  is for the patient to be transferred to the hospitalist service at [facility].  [name] [name] will have a hematology consultation regarding his thrombocytopenia and neutropenia prior to considering further cardiac evaluation.  A left and right heart catheterization could be considered to investigate his symptoms, however his hematologic abnormalities need to be better understood first as it relates to potential for using dual antiplatelet therapy were [name] [name] to undergo DES intervention.  Hopefully, this is a situation where [name] [name] can be managed  medically, although his current regimen is fairly good.   Signed, Rozann Lesches, MD  11/21/2018 9:48 AM

## 2018-11-21 NOTE — Progress Notes (Signed)
ANTICOAGULATION CONSULT NOTE - Follow Up Consult  Pharmacy Consult for heparin Indication: chest pain/ACS   Height: 5' 9"  (175.3 cm) Weight: 240 lb (108.9 kg) IBW/kg (Calculated) : 70.7 HEPARIN DW (KG): 94.5  Labs: Recent Labs    11/20/18 1520 11/20/18 2240 11/21/18 0250 11/21/18 0300 11/21/18 1022  HGB 14.2  --  13.1  --   --   HCT 41.3  --  39.0  --   --   PLT 88*  --  73*  --   --   APTT 35  --   --   --   --   LABPROT 13.8  --   --   --   --   INR 1.06  --   --   --   --   HEPARINUNFRC  --  0.48  --  0.15* 0.94*  CREATININE 1.28*  --  0.99  --   --   TROPONINI <0.03 <0.03 <0.03  --   --     Assessment:  63yo male on heparin for CP. Heparin level is now supratherapeutic  No issues with line or bleeding reported per RN.   Goal of Therapy:  Heparin level 0.3-0.7 units/ml Monitor platelets by anticoagulation protocol: Yes  Plan: Decrease heparin gtt to 1250 units/hr Check anti-Xa level in ~6 hours and daily while on heparin Continue to monitor H&H and platelets  Isac Sarna, BS Vena Austria, BCPS Clinical Pharmacist Pager [phone] 11/21/2018,11:31 AM

## 2018-11-21 NOTE — Progress Notes (Signed)
ANTICOAGULATION CONSULT NOTE - Follow Up Consult  Pharmacy Consult for heparin Indication: chest pain/ACS   Height: 5' 9"  (175.3 cm) Weight: 240 lb (108.9 kg) IBW/kg (Calculated) : 70.7 HEPARIN DW (KG): 94.5  Labs: Recent Labs    11/20/18 1520 11/20/18 2240 11/21/18 0300  HGB 14.2  --   --   HCT 41.3  --   --   PLT 88*  --   --   APTT 35  --   --   LABPROT 13.8  --   --   INR 1.06  --   --   HEPARINUNFRC  --  0.48 0.15*  CREATININE 1.28*  --   --   TROPONINI <0.03 <0.03  --     Assessment:  63yo male on heparin for CP. Heparin level down to subtherapeutic (0.15) on gtt at 1150 units/hr. No issues with line or bleeding reported per RN.  Goal of Therapy:  Heparin level 0.3-0.7 units/ml Monitor platelets by anticoagulation protocol: Yes  Plan: Rebolus heparin at 2500 units Increase heparin gtt to 1450 units/hr Will f/u 6 hr heparin level  Sherlon Handing, PharmD, BCPS Clinical pharmacist  **Pharmacist phone directory can now be found on [email or web address] ([email or web address]).  Listed under [place].  11/21/2018,4:01 AM

## 2018-11-21 NOTE — Progress Notes (Signed)
[facility] left with patient going to [facility].

## 2018-11-21 NOTE — Progress Notes (Signed)
   11/21/18 2103  Vitals  Temp 98.2 F (36.8 C)  Temp Source Oral  BP 138/67  MAP (mmHg) 88  BP Location Left Arm  BP Method Automatic  Patient Position (if appropriate) Sitting  Pulse Rate 69  Pulse Rate Source Monitor  Resp 18  Oxygen Therapy  SpO2 98 %  O2 Device Room Air  Pt admitted to rm 3E08 from AP, pt alert and oriented, denied chest pain at this time, oriented to room, call bell placed within reach, placed on cardiac monitor, CCMD made aware. [facility] admission made aware.

## 2018-11-21 NOTE — Progress Notes (Signed)
ANTICOAGULATION CONSULT NOTE  Pharmacy Consult:  Heparin Indication: chest pain/ACS   Height: 5' 9"  (175.3 cm) Weight: 240 lb (108.9 kg) IBW/kg (Calculated) : 70.7 HEPARIN DW (KG): 94.5  Labs: Recent Labs    11/20/18 1520  11/20/18 2240 11/21/18 0250 11/21/18 0300 11/21/18 1022 11/21/18 1804  HGB 14.2  --   --  13.1  --   --   --   HCT 41.3  --   --  39.0  --   --   --   PLT 88*  --   --  73*  --   --   --   APTT 35  --   --   --   --   --   --   LABPROT 13.8  --   --   --   --   --   --   INR 1.06  --   --   --   --   --   --   HEPARINUNFRC  --    < > 0.48  --  0.15* 0.94* 0.75*  CREATININE 1.28*  --   --  0.99  --   --   --   TROPONINI <0.03  --  <0.03 <0.03  --   --   --    < > = values in this interval not displayed.    Assessment:  62 YOM continues on IV heparin for chest pain.  Heparin level remains supra-therapeutic; no bleeding nor issue with infusion per RN.  Patient is currently at [facility]   Goal of Therapy:  Heparin level 0.3-0.7 units/ml Monitor platelets by anticoagulation protocol: Yes  Plan: Decrease heparin gtt to 1150 units/hr F/U AM labs   Chosen Garron D. Mina Marble, PharmD, BCPS, [place] 11/21/2018, 8:03 PM

## 2018-11-21 NOTE — Progress Notes (Signed)
Patient awaiting transport to [facility]. CPAP unit pulled out of room and patient's Dulera inhaler given to wife to take with them to [facility].

## 2018-11-21 NOTE — Progress Notes (Signed)
PROGRESS NOTE  [name] [name] HDQ:[id number] DOB: 1956-10-24 DOA: 11/20/2018 PCP: Terrial Rhodes, MD   LOS: 1 day   Brief Narrative / Interim history: [name] [name] is a 63 y.o. male with medical history significant for CAD s/p PCI and CABG, IDT2DM, HTN, HLD, TIA, and OSA (not using CPAP) who presents to the ED with chest pain.  Patient reports returning to work at [employer] which has involved heavy lifting.  He says since then he has noticed about 2 weeks of exertional left-sided chest pain described as a pressure-like sensation that radiates to his left shoulder and left neck but not his jaw or back.  During these episodes he has associated dyspnea on exertion, lightheadedness without syncope, and diaphoresis.  He has noted occasional palpitations.  He says his symptoms resolve with rest.  He denies any symptom onset while at rest.  He denies any associated nausea, vomiting, abdominal pain.  His family visited today convinced him to come to the ED for further evaluation once finding out about the symptoms he has been describing.  Subjective: -Patient is feeling well this morning, denies any chest pain, denies any palpitations.  No shortness of breath, no abdominal pain, no nausea or vomiting.  Assessment & Plan: Principal Problem:   Coronary artery disease involving native coronary artery with unstable angina pectoris ([place]) Active Problems:   Mixed hyperlipidemia   Essential hypertension, benign   Obstructive sleep apnea-declines C-pap   Type 2 diabetes mellitus (HCC)   Anxiety   Chest pain   Acute kidney injury [facility])   Principal Problem Chest pain, exertional angina progressive over the last few weeks with history of coronary artery disease with CABG in 2017 -This is a high risk patient, cardiology was consulted, discussed with Dr. Domenic Polite and recommended transfer to [facility] for cardiac catheterization -Continue IV heparin -2D echo pending -Continue aspirin -We will  consult oncology for leukopenia as well as thrombocytopenia at cardiology request  Active Problems Acute kidney injury -Creatinine mildly elevated on admission 1.28, he received IV fluids overnight and creatinine has now normalized this morning.  Continue to closely monitor following cardiac catheterization  Insulin-dependent type 2 diabetes -Continue long-acting insulin as well as sliding scale  Leukopenia/thrombocytopenia -Appears to be chronic, patient has been aware that the blood counts were a little low but he says nobody has worked this up -Oncology consulted, discussed with Dr. Benay Spice  Hypertension/hyperlipidemia -Continue home medications  History of asthma -No wheezing, this is stable  Obstructive sleep apnea -Continue CPAP   Scheduled Meds: . amLODipine  5 mg Oral QHS  . aspirin EC  81 mg Oral Daily  . citalopram  20 mg Oral QHS  . ezetimibe  10 mg Oral QHS  . gabapentin  300 mg Oral TID  . Influenza vac split quadrivalent PF  0.5 mL Intramuscular Tomorrow-1000  . insulin aspart  0-15 Units Subcutaneous TID WC  . insulin aspart  0-5 Units Subcutaneous QHS  . insulin glargine  60 Units Subcutaneous QHS  . insulin glargine  70 Units Subcutaneous Daily  . isosorbide mononitrate  30 mg Oral QHS  . Melatonin  9 mg Oral QHS  . metoprolol tartrate  50 mg Oral QHS  . mometasone-formoterol  2 puff Inhalation BID  . pravastatin  20 mg Oral Daily  . sodium chloride flush  3 mL Intravenous Q12H   Continuous Infusions: . heparin 1,450 Units/hr (11/21/18 0408)   PRN Meds:.acetaminophen **OR** acetaminophen, albuterol, morphine injection, nitroGLYCERIN, ondansetron **  OR** ondansetron (ZOFRAN) IV  DVT prophylaxis: heparin gtt Code Status: Full code Family Communication: wife at bedside  Disposition Plan: transfer to [facility]   Consultants:   Cardiology   Procedures:   None   Antimicrobials:  None    Objective: Vitals:   11/20/18 2213 11/20/18 2321 11/21/18  0523 11/21/18 0816  BP:   115/62   Pulse:  63 (!) 58   Resp:  16    Temp:      TempSrc:      SpO2: 98% 97% 97% 96%  Weight:      Height:        Intake/Output Summary (Last 24 hours) at 11/21/2018 1046 Last data filed at 11/21/2018 0900 Gross per 24 hour  Intake 1410.04 ml  Output -  Net 1410.04 ml   Filed Weights   11/20/18 1507  Weight: 108.9 kg    Examination:  Constitutional: NAD Eyes: PERRL, lids and conjunctivae normal ENMT: Mucous membranes are moist.  Neck: normal, supple, no masses, no thyromegaly Respiratory: clear to auscultation bilaterally, no wheezing, no crackles. Normal respiratory effort. No accessory muscle use.  Cardiovascular: Regular rate and rhythm, no murmurs / rubs / gallops. Trace LE edema.  Abdomen: no tenderness. Bowel sounds positive.  Musculoskeletal: no clubbing / cyanosis.  Skin: no rashes Neurologic: CN 2-12 grossly intact. Strength 5/5 in all 4.  Psychiatric: Normal judgment and insight. Alert and oriented x 3. Normal mood.    Data Reviewed: I have independently reviewed following labs and imaging studies   CBC: Recent Labs  Lab 11/20/18 1520 11/21/18 0250  WBC 2.6* 2.5*  NEUTROABS 1.3*  --   HGB 14.2 13.1  HCT 41.3 39.0  MCV 89.4 90.9  PLT 88* 73*   Basic Metabolic Panel: Recent Labs  Lab 11/20/18 1520 11/21/18 0250  NA 135 137  K 4.2 3.9  CL 106 108  CO2 20* 21*  GLUCOSE 359* 246*  BUN 19 19  CREATININE 1.28* 0.99  CALCIUM 8.6* 8.2*   GFR: Estimated Creatinine Clearance: 96.5 mL/min (by C-G formula based on SCr of 0.99 mg/dL). Liver Function Tests: No results for input(s): AST, ALT, ALKPHOS, BILITOT, PROT, ALBUMIN in the last 168 hours. No results for input(s): LIPASE, AMYLASE in the last 168 hours. No results for input(s): AMMONIA in the last 168 hours. Coagulation Profile: Recent Labs  Lab 11/20/18 1520  INR 1.06   Cardiac Enzymes: Recent Labs  Lab 11/20/18 1520 11/20/18 2240 11/21/18 0250  TROPONINI  <0.03 <0.03 <0.03   BNP (last 3 results) No results for input(s): PROBNP in the last 8760 hours. HbA1C: No results for input(s): HGBA1C in the last 72 hours. CBG: Recent Labs  Lab 11/20/18 2132 11/21/18 0753  GLUCAP 187* 157*   Lipid Profile: No results for input(s): CHOL, HDL, LDLCALC, TRIG, CHOLHDL, LDLDIRECT in the last 72 hours. Thyroid Function Tests: No results for input(s): TSH, T4TOTAL, FREET4, T3FREE, THYROIDAB in the last 72 hours. Anemia Panel: No results for input(s): VITAMINB12, FOLATE, FERRITIN, TIBC, IRON, RETICCTPCT in the last 72 hours. Urine analysis:    Component Value Date/Time   COLORURINE YELLOW 08/12/2017 1640   APPEARANCEUR CLEAR 08/12/2017 1640   LABSPEC 1.019 08/12/2017 1640   PHURINE 5.0 08/12/2017 1640   GLUCOSEU 50 (A) 08/12/2017 1640   HGBUR NEGATIVE 08/12/2017 1640   BILIRUBINUR NEGATIVE 08/12/2017 1640   KETONESUR NEGATIVE 08/12/2017 1640   PROTEINUR NEGATIVE 08/12/2017 1640   UROBILINOGEN 0.2 12/04/2013 0730   NITRITE NEGATIVE 08/12/2017 1640  LEUKOCYTESUR NEGATIVE 08/12/2017 1640   Sepsis Labs: Invalid input(s): PROCALCITONIN, LACTICIDVEN  No results found for this or any previous visit (from the past 240 hour(s)).    Radiology Studies: Dg Chest Port 1 View  Result Date: 11/20/2018 CLINICAL DATA:  Chest pain EXAM: PORTABLE CHEST 1 VIEW COMPARISON:  10/12/2017 FINDINGS: 1226 hours. The lungs are clear without focal pneumonia, edema, pneumothorax or pleural effusion. The cardiopericardial silhouette is within normal limits for size. Status post CABG. The visualized bony structures of the thorax are intact. Telemetry leads overlie the chest. IMPRESSION: No active disease. Electronically Signed   By: Misty Stanley M.D.   On: 11/20/2018 15:58    Marzetta Board, MD, PhD [facility]  Contact via  [email or web address]  [place] P: [phone]  F: [phone]

## 2018-11-22 ENCOUNTER — Ambulatory Visit (HOSPITAL_COMMUNITY): Admit: 2018-11-22 | Payer: Medicare Other | Admitting: Cardiology

## 2018-11-22 ENCOUNTER — Encounter (HOSPITAL_COMMUNITY)
Admission: EM | Disposition: A | Discharge status: Home / Self Care | Payer: Self-pay | Source: Home / Self Care | Attending: Internal Medicine

## 2018-11-22 ENCOUNTER — Inpatient Hospital Stay (HOSPITAL_COMMUNITY): Payer: Medicare Other

## 2018-11-22 DIAGNOSIS — R079 Chest pain, unspecified: Secondary | ICD-10-CM

## 2018-11-22 DIAGNOSIS — I10 Essential (primary) hypertension: Secondary | ICD-10-CM

## 2018-11-22 DIAGNOSIS — K7581 Nonalcoholic steatohepatitis (NASH): Secondary | ICD-10-CM

## 2018-11-22 DIAGNOSIS — D61818 Other pancytopenia: Secondary | ICD-10-CM

## 2018-11-22 DIAGNOSIS — I2510 Atherosclerotic heart disease of native coronary artery without angina pectoris: Secondary | ICD-10-CM

## 2018-11-22 HISTORY — PX: LEFT HEART CATH AND CORS/GRAFTS ANGIOGRAPHY: CATH118250

## 2018-11-22 LAB — CBC WITH DIFFERENTIAL/PLATELET
Abs Immature Granulocytes: 0 10*3/uL (ref 0.00–0.07)
Basophils Absolute: 0 10*3/uL (ref 0.0–0.1)
Basophils Relative: 1 %
Eosinophils Absolute: 0.1 10*3/uL (ref 0.0–0.5)
Eosinophils Relative: 2 %
HCT: 38.6 % — ABNORMAL LOW (ref 39.0–52.0)
Hemoglobin: 13.1 g/dL (ref 13.0–17.0)
Immature Granulocytes: 0 %
Lymphocytes Relative: 35 %
Lymphs Abs: 1 10*3/uL (ref 0.7–4.0)
MCH: 30.3 pg (ref 26.0–34.0)
MCHC: 33.9 g/dL (ref 30.0–36.0)
MCV: 89.4 fL (ref 80.0–100.0)
Monocytes Absolute: 0.3 10*3/uL (ref 0.1–1.0)
Monocytes Relative: 9 %
NRBC: 0 % (ref 0.0–0.2)
Neutro Abs: 1.5 10*3/uL — ABNORMAL LOW (ref 1.7–7.7)
Neutrophils Relative %: 53 %
Platelets: 77 10*3/uL — ABNORMAL LOW (ref 150–400)
RBC: 4.32 MIL/uL (ref 4.22–5.81)
RDW: 12.8 % (ref 11.5–15.5)
WBC: 2.8 10*3/uL — AB (ref 4.0–10.5)

## 2018-11-22 LAB — GLUCOSE, CAPILLARY
GLUCOSE-CAPILLARY: 151 mg/dL — AB (ref 70–99)
Glucose-Capillary: 116 mg/dL — ABNORMAL HIGH (ref 70–99)
Glucose-Capillary: 102 mg/dL — ABNORMAL HIGH (ref 70–99)
Glucose-Capillary: 91 mg/dL (ref 70–99)

## 2018-11-22 LAB — HEPARIN LEVEL (UNFRACTIONATED): Heparin Unfractionated: 0.47 IU/mL (ref 0.30–0.70)

## 2018-11-22 LAB — SAVE SMEAR (SSMR)

## 2018-11-22 LAB — VITAMIN B12: Vitamin B-12: 222 pg/mL (ref 180–914)

## 2018-11-22 LAB — HIV ANTIBODY (ROUTINE TESTING W REFLEX): HIV Screen 4th Generation wRfx: NONREACTIVE

## 2018-11-22 SURGERY — LEFT HEART CATH AND CORS/GRAFTS ANGIOGRAPHY
Anesthesia: LOCAL

## 2018-11-22 MED ORDER — LIDOCAINE HCL (PF) 1 % IJ SOLN
INTRAMUSCULAR | Status: AC
  Filled 2018-11-22: qty 30

## 2018-11-22 MED ORDER — SODIUM CHLORIDE 0.9% FLUSH
3.0000 mL | Freq: Two times a day (BID) | INTRAVENOUS | Status: DC
  Administered 2018-11-23: 3 mL via INTRAVENOUS

## 2018-11-22 MED ORDER — SODIUM CHLORIDE 0.9% FLUSH: 3.0000 mL | INTRAVENOUS | Status: DC | PRN

## 2018-11-22 MED ORDER — HEPARIN SODIUM (PORCINE) 1000 UNIT/ML IJ SOLN
INTRAMUSCULAR | Status: AC
  Filled 2018-11-22: qty 1

## 2018-11-22 MED ORDER — LIDOCAINE HCL (PF) 1 % IJ SOLN
INTRAMUSCULAR | Status: DC | PRN
  Administered 2018-11-22: 1 mL

## 2018-11-22 MED ORDER — FENTANYL CITRATE (PF) 100 MCG/2ML IJ SOLN
INTRAMUSCULAR | Status: AC
  Filled 2018-11-22: qty 2

## 2018-11-22 MED ORDER — VERAPAMIL HCL 2.5 MG/ML IV SOLN
INTRAVENOUS | Status: DC | PRN
  Administered 2018-11-22: 10 mL via INTRA_ARTERIAL

## 2018-11-22 MED ORDER — IOHEXOL 350 MG/ML SOLN
INTRAVENOUS | Status: DC | PRN
  Administered 2018-11-22: 105 mL via INTRA_ARTERIAL

## 2018-11-22 MED ORDER — SODIUM CHLORIDE 0.9 % WEIGHT BASED INFUSION
1.0000 mL/kg/h | INTRAVENOUS | Status: AC
  Administered 2018-11-22: 1 mL/kg/h via INTRAVENOUS

## 2018-11-22 MED ORDER — HEPARIN SODIUM (PORCINE) 1000 UNIT/ML IJ SOLN
INTRAMUSCULAR | Status: DC | PRN
  Administered 2018-11-22: 5500 [IU] via INTRAVENOUS

## 2018-11-22 MED ORDER — SODIUM CHLORIDE 0.9 % IV SOLN: INTRAVENOUS | Status: DC

## 2018-11-22 MED ORDER — FENTANYL CITRATE (PF) 100 MCG/2ML IJ SOLN
INTRAMUSCULAR | Status: DC | PRN
  Administered 2018-11-22: 25 ug via INTRAVENOUS

## 2018-11-22 MED ORDER — MIDAZOLAM HCL 2 MG/2ML IJ SOLN
INTRAMUSCULAR | Status: AC
  Filled 2018-11-22: qty 2

## 2018-11-22 MED ORDER — SODIUM CHLORIDE 0.9 % IV SOLN: 250.0000 mL | INTRAVENOUS | Status: DC | PRN

## 2018-11-22 MED ORDER — HEPARIN (PORCINE) IN NACL 1000-0.9 UT/500ML-% IV SOLN
INTRAVENOUS | Status: AC
  Filled 2018-11-22: qty 1000

## 2018-11-22 MED ORDER — MIDAZOLAM HCL 2 MG/2ML IJ SOLN
INTRAMUSCULAR | Status: DC | PRN
  Administered 2018-11-22: 2 mg via INTRAVENOUS

## 2018-11-22 MED ORDER — HEPARIN (PORCINE) IN NACL 1000-0.9 UT/500ML-% IV SOLN
INTRAVENOUS | Status: DC | PRN
  Administered 2018-11-22 (×2): 500 mL

## 2018-11-22 MED ORDER — VERAPAMIL HCL 2.5 MG/ML IV SOLN
INTRAVENOUS | Status: AC
  Filled 2018-11-22: qty 2

## 2018-11-22 SURGICAL SUPPLY — 15 items
CATH INFINITI 5 FR AL2 (CATHETERS) ×1 IMPLANT
CATH INFINITI 5 FR AR2 MOD (CATHETERS) ×1 IMPLANT
CATH INFINITI 5 FR IM (CATHETERS) ×1 IMPLANT
CATH INFINITI 5FR AL1 (CATHETERS) ×1 IMPLANT
CATH INFINITI 5FR MULTPACK ANG (CATHETERS) ×1 IMPLANT
DEVICE RAD COMP TR BAND LRG (VASCULAR PRODUCTS) ×1 IMPLANT
ELECT DEFIB PAD ADLT CADENCE (PAD) ×1 IMPLANT
GLIDESHEATH SLEND SS 6F .021 (SHEATH) ×1 IMPLANT
GUIDEWIRE INQWIRE 1.5J.035X260 (WIRE) IMPLANT
INQWIRE 1.5J .035X260CM (WIRE) ×2
KIT HEART LEFT (KITS) ×2 IMPLANT
PACK CARDIAC CATHETERIZATION (CUSTOM PROCEDURE TRAY) ×2 IMPLANT
SYR MEDRAD MARK 7 150ML (SYRINGE) ×2 IMPLANT
TRANSDUCER W/STOPCOCK (MISCELLANEOUS) ×2 IMPLANT
TUBING CIL FLEX 10 FLL-RA (TUBING) ×2 IMPLANT

## 2018-11-22 NOTE — Progress Notes (Signed)
[place] CONSULT NOTE  Patient Care Team: Terrial Rhodes, MD as PCP - General Domenic Polite Aloha Gell, MD as PCP - Cardiology (Cardiology)   ASSESSMENT & PLAN Chronic leukopenia and thrombocytopenia with splenomegaly The cause of the leukopenia and thrombocytopenia is related to sequestration from splenomegaly and fatty liver disease There is absence of anemia, so unlikely due to primary bone marrow disorder He does not need further follow-up in this regard There is no contraindication to remain on antiplatelet agents or anticoagulants as long as the platelet is greater than 50,000. The IV heparin might cause his platelet count drop further due to non-immune mediated thrombocytopenia. There is no contraindication for him to continue on treatment as directed by cardiologist  Fatty liver disease with splenomegaly This is due to poorly controlled diabetes, poor dietary choices and obesity I have extensive discussion with the patient about the importance of getting his diet and diabetes under controlled.  I will order screening test for hepatitis C antibody  Coronary artery disease Will defer to primary service and cardiologist for further management  Discharge planning Will defer to primary service I will return to check on him again tomorrow The patient will likely need hematology outpatient follow-up in the future within a month of discharge   All questions were answered. The patient knows to call the clinic with any problems, questions or concerns. No barriers to learning was detected. I spent 55 minutes counseling the patient face to face. The total time spent in the appointment was 60 minutes and more than 50% was on counseling and review of test results  Heath Lark, MD 11/22/2018 3:24 PM    CHIEF COMPLAINTS/PURPOSE OF CONSULTATION:  Leukopenia and thrombocytopenia  HISTORY OF PRESENTING ILLNESS:  [name] [name] 63 y.o. male is seen at the request by  hospitalist and cardiologist to evaluate this patient with thrombocytopenia and leukopenia.  I have the opportunity to review his CBC dated back all the way to 2009.  He had thrombocytopenia since then with a platelet count of 136 Starting around May 2013, he was noted to have mild leukopenia and thrombocytopenia.  His white count then was 3.8 and platelet count of 126 Between 2013 to present time, his white blood cell count has fluctuated from as low as 2.4 to as high as 11.5.  Over the past 2 years, his white count has been persistently low in the 3 and 2 range  In terms of his platelet count, his platelet count has ranged from 73 to 133 but has not ever been normal. The patient was admitted to the hospital locally due to signs and symptoms of chest pressure/discomfort, worrisome for unstable angina He has significant cardiovascular risk factors including diabetes, heart disease status post CABG, history of stroke and others. On admission on November 20, 2018, his white blood cell count was 2.6, hemoglobin 14.2 and platelet count of 88,000.  The patient was started on IV heparin.  His platelet count dropped subsequently and then rebound back to white count of 2.8, hemoglobin of 13.1 and platelet count of 77,000.  His serum chemistries show significant hyperglycemia with mild renal failure but subsequently improved since admission.  Troponin has been negative.  Serum vitamin B12 was 222.  HIV screening test is negative  On further evaluation, he was noted to have fatty liver disease from prior ultrasound of his abdomen.  This is confirmed again on today's exam along with mild splenomegaly  He denies recent bruising/bleeding, such as spontaneous  epistaxis, hematuria, melena or hematochezia He denies recent new medications or infection He denies prior blood or platelet transfusions   MEDICAL HISTORY:  Past Medical History:  Diagnosis Date  . Anxiety   . Arthritis   . Asthma   . Cataract     Right eye  . CHF (congestive heart failure) ([place])   . Chronic lower back pain   . Coronary atherosclerosis of native coronary artery    a. LAD & OM stenting;  b. 2007 Cath: nonobs dzs, patent stents;  c. 07/2012 neg MV, EF 61%.  d. 11/20/14: [place] s/p  PCI w/ DES to pLAD and DES to OM1   . Essential hypertension, benign   . GERD (gastroesophageal reflux disease)   . Hearing loss of left ear   . History of gout   . History of hiatal hernia   . Hypercholesteremia   . Lumbar herniated disc   . MI, old   . Migraine   . OSA on CPAP   . 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 Thrombocytopenia ([place])   . TIA (transient ischemic attack) ~ 2010  . Type 2 diabetes mellitus ([place])     SURGICAL HISTORY: Past Surgical History:  Procedure Laterality Date  . ANTERIOR CERVICAL DECOMP/DISCECTOMY FUSION  1998   "C3-4"  . CARDIAC CATHETERIZATION  "several"  . CARDIAC CATHETERIZATION N/A 12/30/2015   Procedure: Left Heart Cath and Coronary Angiography;  Surgeon: [name], MD;  Location: [place] [facility];  Service: Cardiovascular;  Laterality: N/A;  . CARPAL TUNNEL RELEASE Bilateral 2005  . COLONOSCOPY    . CORONARY ANGIOPLASTY    . CORONARY ANGIOPLASTY WITH STENT PLACEMENT  2002; 2003; 11/20/2014   "I have 4 stents after today" (11/20/2014)  . CORONARY ARTERY BYPASS GRAFT N/A 01/09/2016   Procedure: CORONARY ARTERY BYPASS GRAFTING (CABG) X 3 UTILIZING RIGHT AND LEFT INTERNAL MAMMARY ARTERY AND ENDOSCOPICALLY HARVESTED SAPHENEOUS VEIN.;  Surgeon: Rexene Alberts, MD;  Location: [place];  Service: Open Heart Surgery;  Laterality: N/A;  . ESOPHAGOGASTRODUODENOSCOPY    . KNEE SURGERY Left 02/2012   "scraped; open"  . LEFT HEART CATH AND CORS/GRAFTS ANGIOGRAPHY N/A 08/09/2017   Procedure: LEFT HEART CATH AND CORS/GRAFTS ANGIOGRAPHY;  Surgeon: [name], MD;  Location: [place] [facility];  Service: Cardiovascular;  Laterality: N/A;  .  LEFT HEART CATHETERIZATION WITH CORONARY ANGIOGRAM N/A 07/20/2012   Procedure: LEFT HEART CATHETERIZATION WITH CORONARY ANGIOGRAM;  Surgeon: [name], MD;  Location: [place] CATH [facility];  Service: Cardiovascular;  Laterality: N/A;  . LEFT HEART CATHETERIZATION WITH CORONARY ANGIOGRAM N/A 11/20/2014   Procedure: LEFT HEART CATHETERIZATION WITH CORONARY ANGIOGRAM;  Surgeon: [name], MD;  Location: [facility] CATH LAB;  Service: Cardiovascular;  Laterality: N/A;  . LEFT HEART CATHETERIZATION WITH CORONARY ANGIOGRAM N/A 11/26/2014   Procedure: LEFT HEART CATHETERIZATION WITH CORONARY ANGIOGRAM;  Surgeon: [name], MD;  Location: [facility] C[facility] LAB;  Service: Cardiovascular;  Laterality: N/A;  . NEUROPLASTY / TRANSPOSITION ULNAR NERVE AT ELBOW Right ~ 2012  . PERCUTANEOUS CORONARY ROTOBLATOR INTERVENTION (PCI-R)  11/20/2014   Procedure: PERCUTANEOUS CORONARY ROTOBLATOR INTERVENTION (PCI-R);  Surgeon: [name], MD;  Location: [facility] CATH LAB;  Service: Cardiovascular;;  . SHOULDER ARTHROSCOPY Left ~ 2011  . TEE WITHOUT CARDIOVERSION N/A 01/09/2016   Procedure: TRANSESOPHAGEAL ECHOCARDIOGRAM (TEE);  Surgeon: Rexene Alberts, MD;  Location: [place];  Service: Open Heart Surgery;  Laterality: N/A;    SOCIAL HISTORY: Social History   Socioeconomic History  . Marital status: Married    Spouse name: [name]  . Number of children: 3  . Years of education: 2 th   . Highest education level: Not on file  Occupational History  . Occupation: Unemployed  Social Needs  . Financial resource strain: Not on file  . Food insecurity:    Worry: Not on file    Inability: Not on file  . Transportation needs:    Medical: Not on file    Non-medical: Not on file  Tobacco Use  . Smoking status: Never Smoker  . Smokeless tobacco: Never Used  Substance and Sexual Activity  . Alcohol use: No  . Drug use: No  . Sexual activity: Yes  Lifestyle  . Physical activity:    Days per week: Not on file    Minutes per  session: Not on file  . Stress: Not on file  Relationships  . Social connections:    Talks on phone: Not on file    Gets together: Not on file    Attends religious service: Not on file    Active member of club or organization: Not on file    Attends meetings of clubs or organizations: Not on file    Relationship status: Not on file  . Intimate partner violence:    Fear of current or ex partner: Not on file    Emotionally abused: Not on file    Physically abused: Not on file    Forced sexual activity: Not on file  Other Topics Concern  . Not on file  Social History Narrative   Patient lives at home with wife [name].   Patient works at [employer], Engineer, petroleum.   Patient has a 12 th grade education.    Patient has 3 children.        FAMILY HISTORY: Family History  Problem Relation Age of Onset  . Coronary artery disease Father 16    ALLERGIES:  is allergic to divalproex sodium; imdur [isosorbide dinitrate]; lipitor [atorvastatin calcium]; statins; tramadol; tricor [fenofibrate]; and valproic acid.  MEDICATIONS:  Current Facility-Administered Medications  Medication Dose Route Frequency Provider Last Rate Last Dose  . acetaminophen (TYLENOL) tablet 650 mg  650 mg Oral Q6H PRN Lenore Cordia, MD   650 mg at 11/21/18 2008   Or  . acetaminophen (TYLENOL) suppository 650 mg  650 mg Rectal Q6H PRN Zada Finders R, MD      . albuterol (PROVENTIL) (2.5 MG/3ML) 0.083% nebulizer solution 2.5 mg  2.5 mg Inhalation Q6H PRN Zada Finders R, MD      . amLODipine (NORVASC) tablet 5 mg  5 mg Oral QHS Lenore Cordia, MD   5 mg at 11/21/18 2232  . aspirin EC tablet 81 mg  81 mg Oral Daily Zada Finders R, MD   81 mg at 11/22/18 1049  . citalopram (CELEXA) tablet 20 mg  20 mg Oral QHS Lenore Cordia, MD   20 mg at 11/21/18 2232  . ezetimibe (ZETIA) tablet 10 mg  10 mg Oral QHS Lenore Cordia, MD   10 mg at 11/21/18 2232  . gabapentin (NEURONTIN) capsule 300 mg  300 mg Oral TID  Lenore Cordia, MD   300 mg at 11/22/18 1050  . insulin aspart (novoLOG) injection 0-15 Units  0-15 Units Subcutaneous TID WC Lenore Cordia, MD      .  insulin aspart (novoLOG) injection 0-5 Units  0-5 Units Subcutaneous QHS Patel, Vishal R, MD      . insulin glargine (LANTUS) injection 60 Units  60 Units Subcutaneous QHS Lenore Cordia, MD   60 Units at 11/21/18 2325  . insulin glargine (LANTUS) injection 70 Units  70 Units Subcutaneous Daily Zada Finders R, MD      . isosorbide mononitrate (IMDUR) 24 hr tablet 30 mg  30 mg Oral QHS Lenore Cordia, MD   30 mg at 11/21/18 2232  . Melatonin TABS 9 mg  9 mg Oral QHS Zada Finders R, MD   9 mg at 11/21/18 2325  . metoprolol tartrate (LOPRESSOR) tablet 50 mg  50 mg Oral QHS Lenore Cordia, MD   50 mg at 11/21/18 2232  . mometasone-formoterol (DULERA) 200-5 MCG/ACT inhaler 2 puff  2 puff Inhalation BID Lenore Cordia, MD   2 puff at 11/21/18 1923  . morphine 4 MG/ML injection 4 mg  4 mg Intravenous Q1H PRN Francine Graven, DO   4 mg at 11/20/18 1739  . nitroGLYCERIN (NITROSTAT) SL tablet 0.4 mg  0.4 mg Sublingual Q5 min PRN Francine Graven, DO   0.4 mg at 11/20/18 1629  . ondansetron (ZOFRAN) tablet 4 mg  4 mg Oral Q6H PRN Lenore Cordia, MD       Or  . ondansetron [facility]) injection 4 mg  4 mg Intravenous Q6H PRN Lenore Cordia, MD   4 mg at 11/21/18 2008  . pravastatin (PRAVACHOL) tablet 20 mg  20 mg Oral Daily Zada Finders R, MD   20 mg at 11/22/18 1050  . sodium chloride flush (NS) 0.9 % injection 3 mL  3 mL Intravenous Q12H Lenore Cordia, MD   3 mL at 11/20/18 2154    REVIEW OF SYSTEMS:   Constitutional: Denies fevers, chills or abnormal night sweats Eyes: Denies blurriness of vision, double vision or watery eyes Ears, nose, mouth, throat, and face: Denies mucositis or sore throat Respiratory: Denies cough, dyspnea or wheezes Gastrointestinal:  Denies nausea, heartburn or change in bowel habits Skin: Denies abnormal skin  rashes Lymphatics: Denies new lymphadenopathy or easy bruising Neurological:Denies numbness, tingling or new weaknesses Behavioral/Psych: Mood is stable, no new changes  All other systems were reviewed with the patient and are negative.  PHYSICAL EXAMINATION: ECOG PERFORMANCE STATUS: 1 - Symptomatic but completely ambulatory  Vitals:   11/22/18 0420 11/22/18 1200  BP: 131/67 132/77  Pulse: 65 (!) 59  Resp: 20 18  Temp: 98.3 F (36.8 C) 98.1 F (36.7 C)  SpO2: 95% 96%   Filed Weights   11/20/18 1507 11/21/18 2103 11/22/18 0420  Weight: 240 lb (108.9 kg) 237 lb 11.2 oz (107.8 kg) 238 lb 4.8 oz (108.1 kg)    GENERAL:alert, no distress and comfortable.  He is obese SKIN: skin color, texture, turgor are normal, no rashes or significant lesions EYES: normal, conjunctiva are pink and non-injected, sclera clear OROPHARYNX:no exudate, no erythema and lips, buccal mucosa, and tongue normal  NECK: supple, thyroid normal size, non-tender, without nodularity LYMPH:  no palpable lymphadenopathy in the cervical, axillary or inguinal LUNGS: clear to auscultation and percussion with normal breathing effort HEART: regular rate & rhythm and no murmurs and no lower extremity edema ABDOMEN:abdomen soft, non-tender and normal bowel sounds Musculoskeletal:no cyanosis of digits and no clubbing  PSYCH: alert & oriented x 3 with fluent speech NEURO: no focal motor/sensory deficits  LABORATORY DATA:  I have reviewed the  data as listed Lab Results  Component Value Date   WBC 2.8 (L) 11/22/2018   HGB 13.1 11/22/2018   HCT 38.6 (L) 11/22/2018   MCV 89.4 11/22/2018   PLT 77 (L) 11/22/2018   I have reviewed his peripheral blood smear.  Normal morphology of WBC and RBC Absolute thrombocytopenia is seen.  No platelet clumping is noted.  No schistocytes.  RADIOGRAPHIC STUDIES: I have personally reviewed the radiological images as listed and agreed with the findings in the report. US Abdomen  Complete  Result Date: 11/22/2018 CLINICAL DATA:  Pancytopenia. Evaluate for cirrhosis and splenomegaly EXAM: ABDOMEN ULTRASOUND COMPLETE COMPARISON:  12/29/2012 FINDINGS: Gallbladder: No gallstones or wall thickening visualized. No sonographic Murphy sign noted by sonographer. Common bile duct: Diameter: Normal caliber, 5 mm Liver: Increased echotexture compatible with fatty infiltration. No focal abnormality or biliary ductal dilatation. Portal vein is patent on color Doppler imaging with normal direction of blood flow towards the liver. IVC: No abnormality visualized. Pancreas: Not well visualized due to overlying bowel gas. Spleen: Mild splenomegaly. Craniocaudal length is upper limits of normal at 12.3 cm, but elevated volume at 517 mL. No focal abnormality. Right Kidney: Length: 11.7 cm. Echogenicity within normal limits. No mass or hydronephrosis visualized. Left Kidney: Length: 11.9 cm. Echogenicity within normal limits. No mass or hydronephrosis visualized. Abdominal aorta: No aneurysm visualized. Other findings: None. IMPRESSION: Fatty infiltration of the liver. Borderline splenomegaly. Electronically Signed   By: Rolm Baptise M.D.   On: 11/22/2018 09:08   Dg Chest Port 1 View  Result Date: 11/20/2018 CLINICAL DATA:  Chest pain EXAM: PORTABLE CHEST 1 VIEW COMPARISON:  10/12/2017 FINDINGS: 1226 hours. The lungs are clear without focal pneumonia, edema, pneumothorax or pleural effusion. The cardiopericardial silhouette is within normal limits for size. Status post CABG. The visualized bony structures of the thorax are intact. Telemetry leads overlie the chest. IMPRESSION: No active disease. Electronically Signed   By: Misty Stanley M.D.   On: 11/20/2018 15:58

## 2018-11-22 NOTE — Interval H&P Note (Signed)
History and Physical Interval Note:  11/22/2018 3:50 PM  [name] [name]  has presented today for surgery, with the diagnosis of ua  The various methods of treatment have been discussed with the patient and family. After consideration of risks, benefits and other options for treatment, the patient has consented to  Procedure(s): LEFT HEART CATH AND CORS/GRAFTS ANGIOGRAPHY (N/A) as a surgical intervention .  The patient's history has been reviewed, patient examined, no change in status, stable for surgery.  I have reviewed the patient's chart and labs.  Questions were answered to the patient's satisfaction.   Cath Lab Visit (complete for each Cath Lab visit)  Clinical Evaluation Leading to the Procedure:   ACS: Yes.    Non-ACS:    Anginal Classification: CCS III  Anti-ischemic medical therapy: Maximal Therapy (2 or more classes of medications)  Non-Invasive Test Results: No non-invasive testing performed  Prior CABG: Previous CABG       [name] [name] [facility] 11/22/2018 3:50 PM

## 2018-11-22 NOTE — Progress Notes (Addendum)
Progress Note  Patient Name: [name] [name] Date of Encounter: 11/22/2018  Primary Cardiologist: Rozann Lesches, MD   Subjective   No significant overnight events. Patient continues to have some mild left sided chest pain when ambulating to the bathroom. No significant shortness of breath.  Inpatient Medications    Scheduled Meds: . amLODipine  5 mg Oral QHS  . aspirin EC  81 mg Oral Daily  . citalopram  20 mg Oral QHS  . ezetimibe  10 mg Oral QHS  . gabapentin  300 mg Oral TID  . insulin aspart  0-15 Units Subcutaneous TID WC  . insulin aspart  0-5 Units Subcutaneous QHS  . insulin glargine  60 Units Subcutaneous QHS  . insulin glargine  70 Units Subcutaneous Daily  . isosorbide mononitrate  30 mg Oral QHS  . Melatonin  9 mg Oral QHS  . metoprolol tartrate  50 mg Oral QHS  . mometasone-formoterol  2 puff Inhalation BID  . pravastatin  20 mg Oral Daily  . sodium chloride flush  3 mL Intravenous Q12H   Continuous Infusions: . heparin 1,150 Units/hr (11/22/18 0322)   PRN Meds: acetaminophen **OR** acetaminophen, albuterol, morphine injection, nitroGLYCERIN, ondansetron **OR** ondansetron (ZOFRAN) IV   Vital Signs    Vitals:   11/21/18 2103 11/21/18 2332 11/22/18 0420 11/22/18 1200  BP: 138/67 (!) 149/74 131/67 132/77  Pulse: 69 74 65 (!) 59  Resp: 18 18 20 18   Temp: 98.2 F (36.8 C) 98.3 F (36.8 C) 98.3 F (36.8 C) 98.1 F (36.7 C)  TempSrc: Oral Oral Oral Oral  SpO2: 98% 96% 95% 96%  Weight: 107.8 kg  108.1 kg   Height: 5' 9"  (1.753 m)       Intake/Output Summary (Last 24 hours) at 11/22/2018 1356 Last data filed at 11/22/2018 1235 Gross per 24 hour  Intake 462.93 ml  Output 950 ml  Net -487.07 ml   Last 3 Weights 11/22/2018 11/21/2018 11/20/2018  Weight (lbs) 238 lb 4.8 oz 237 lb 11.2 oz 240 lb  Weight (kg) 108.092 kg 107.82 kg 108.863 kg      Telemetry    Sinus rhythm with heart rates in the 60's to 80's. - Personally Reviewed  ECG    No new  ECG tracing today. - Personally Reviewed  Physical Exam   GEN: Caucasian obese male resting comfortably. Alert and in no acute distress.  Neck: Supple. Cardiac: RRR. No murmurs, rubs, or gallops. No chest wall tenderness. Radial and distal pedal pulses 2+ and equal. Respiratory: Clear to auscultation bilaterally. No wheezes, rhonchi, or rales. GI: Soft, non-tender, non-distended. Bowel sounds present. MS: No lower extremity edema. No deformity. Neuro:  No focal deficits. Psych: Normal affect.   Labs    Chemistry Recent Labs  Lab 11/20/18 1520 11/21/18 0250  NA 135 137  K 4.2 3.9  CL 106 108  CO2 20* 21*  GLUCOSE 359* 246*  BUN 19 19  CREATININE 1.28* 0.99  CALCIUM 8.6* 8.2*  PROT  --  6.2*  ALBUMIN  --  3.2*  AST  --  32  ALT  --  33  ALKPHOS  --  38  BILITOT  --  0.5  GFRNONAA >60 >60  GFRAA >60 >60  ANIONGAP 9 8     Hematology Recent Labs  Lab 11/20/18 1520 11/21/18 0250 11/22/18 0641  WBC 2.6* 2.5* 2.8*  RBC 4.62 4.29 4.32  HGB 14.2 13.1 13.1  HCT 41.3 39.0 38.6*  MCV 89.4 90.9  89.4  MCH 30.7 30.5 30.3  MCHC 34.4 33.6 33.9  RDW 12.9 13.2 12.8  PLT 88* 73* 77*    Cardiac Enzymes Recent Labs  Lab 11/20/18 1520 11/20/18 2240 11/21/18 0250  TROPONINI <0.03 <0.03 <0.03   No results for input(s): TROPIPOC in the last 168 hours.   BNPNo results for input(s): BNP, PROBNP in the last 168 hours.   DDimer No results for input(s): DDIMER in the last 168 hours.   Radiology    US Abdomen Complete  Result Date: 11/22/2018 CLINICAL DATA:  Pancytopenia. Evaluate for cirrhosis and splenomegaly EXAM: ABDOMEN ULTRASOUND COMPLETE COMPARISON:  12/29/2012 FINDINGS: Gallbladder: No gallstones or wall thickening visualized. No sonographic Murphy sign noted by sonographer. Common bile duct: Diameter: Normal caliber, 5 mm Liver: Increased echotexture compatible with fatty infiltration. No focal abnormality or biliary ductal dilatation. Portal vein is patent on color  Doppler imaging with normal direction of blood flow towards the liver. IVC: No abnormality visualized. Pancreas: Not well visualized due to overlying bowel gas. Spleen: Mild splenomegaly. Craniocaudal length is upper limits of normal at 12.3 cm, but elevated volume at 517 mL. No focal abnormality. Right Kidney: Length: 11.7 cm. Echogenicity within normal limits. No mass or hydronephrosis visualized. Left Kidney: Length: 11.9 cm. Echogenicity within normal limits. No mass or hydronephrosis visualized. Abdominal aorta: No aneurysm visualized. Other findings: None. IMPRESSION: Fatty infiltration of the liver. Borderline splenomegaly. Electronically Signed   By: Rolm Baptise M.D.   On: 11/22/2018 09:08   Dg Chest Port 1 View  Result Date: 11/20/2018 CLINICAL DATA:  Chest pain EXAM: PORTABLE CHEST 1 VIEW COMPARISON:  10/12/2017 FINDINGS: 1226 hours. The lungs are clear without focal pneumonia, edema, pneumothorax or pleural effusion. The cardiopericardial silhouette is within normal limits for size. Status post CABG. The visualized bony structures of the thorax are intact. Telemetry leads overlie the chest. IMPRESSION: No active disease. Electronically Signed   By: Misty Stanley M.D.   On: 11/20/2018 15:58    Cardiac Studies   Echocardiogram 11/22/2018: Study Conclusions: - Left ventricle: The cavity size was normal. Wall thickness was   increased in a pattern of mild LVH. Systolic function was normal.   The estimated ejection fraction was in the range of 55% to 60%.   Wall motion was normal; there were no regional wall motion   abnormalities. Definity contrast was utilized. Indeterminate   diastolic function. - Aortic valve: There was mild regurgitation. - Mitral valve: Mildly calcified annulus. - Right atrium: Central venous pressure (est): 3 mm Hg. - Atrial septum: No defect or patent foramen ovale was identified. - Tricuspid valve: There was mild regurgitation. - Pulmonary arteries: PA peak  pressure: 25 mm Hg (S). - Pericardium, extracardiac: There was no pericardial effusion.  Patient Profile     63 y.o. male with a history of CAD s/p previous PCI and CABG in 2017, recurrent atypical chest pain, hypertension, hyperlipidemia, type 2 diabetes mellitus, obstructive sleep apnea on CPAP, previous TIA, and chronic thrombocytopenia, who presented to [facility] ED on 11/20/2018 for evaluation of chest pain and dyspnea on exertion for the last couple of weeks. Cardiology was consulted and recommended be transferred to [name] for hematology consulting regarding his thrombocytopenia and neutropenia prior to considering further cardiac evaluation.   Assessment & Plan    Progressive Dyspnea on Exertion and Chest Pain with Known CAD - Patient s/p CABG with LIMA to LAD, SVG to PDA, and free radial to OM1 in 2017. Cardiac catheterization in  08/2017 showed patent grafts with progression of severe diffuse distal LAD. Medical management was recommended at that time. - EKG showed no acute ischemic changes. - Troponin negative x3. - Patient currently chest pain free at rest but notes continued mild left-sides chest pain with radiation to left shoulder with ambulation to the bathroom. - Currently on IV Heparin drip. - Continue Aspirin, beta-blocker, statin, Zetia, and Imdur. - Per Dr. Myles Gip note yesterday, "left and right heart catheterization could be considered to investigate his symptoms, however his hematologic abnormalities need to be better understood first." Will defer timing to MD.  Hypertension - BP currently well controlled at 132/77.  - Continue current medications: Amlodipine 76m daily, Lopressor 558mtwice daily, and Imdur 3016maily.  Hyperlipidemia - Lipid panel from 06/2018: Cholesterol 114, Triglycerides 250, HDL 26, LDL 38.  - At LDL goal of <70 given CAD. - Continue Pravastatin and Zetia.  Leukopenia and Thrombocytopenia - WBC 2.8 and platelets 77.  - Possibly due to  sequestration form liver cirrhosis with splenomegaly per Hematology note. Abdominal ultrasound has been ordered for further evaluation. - Per Hematology, "If cardiac catheterization is imminent, from my standpoint, there is no contraindication for him to proceed.  He can be anticoagulated or receive antiplatelet agents as long as his platelet count is over 50,000." - Management per primary team and Hematology.  Otherwise, per primary team.  For questions or updates, please contact [facility] consult [email or web address] for contact info under        Signed, CalDarreld McleanA-C  11/22/2018, 1:56 PM    Personally seen and examined. Agree with above.   He still has ongoing chest discomfort with ambulation, unstable anginal-like symptoms.  Troponins have been negative x3.  Dr. McDDomenic Politent him down here for diagnostic angiography.  Hematology has seen him and feels comfortable with him proceeding with heart catheterization as long as platelets remain over 50,000.  He is currently 77,000.  He has been n.p.o.  We have called the catheterization lab.  There is availability today.  We will proceed with heart catheterization.  Risks and benefits have been discussed with patient including bleeding risks.  He and his wife are willing to proceed.  Antianginal medication such as beta-blocker isosorbide have been utilized.  We will stop his IV heparin.  He has received aspirin 81 mg.  Prior heart catheterization 2018: Conclusions: 1. Severe native coronary artery disease, as detailed below. Most likely culprit for the patient's symptoms is progression of severe diffuse distal LAD supplied by the LIMA. 2. Widely patent LIMA to LAD and SVG to RPDA. Free radial to inferior branch of OM1 is patent with approximately 40% ostial stenosis. 3. Mildly elevated left ventricular filling pressure.  Recommendations: 1. Aggressive medical therapy and secondary prevention. There are no interventional  targets at this time. 2. Consider gentle diuresis, given elevated left ventricular filling pressure.  ChrNelva BushD  Previously resulted in medical management.  MarCandee FurbishD

## 2018-11-22 NOTE — Progress Notes (Signed)
I was informed by Dr. Benay Spice to see this patient yesterday. He was transferred from [facility] in regards with possible unstable angina I attempted to see this patient at 645 this morning but he was already gone for cardiac echocardiogram. I met with his wife, Olean Ree I have reviewed his chart CBC from this morning has not been drawn Overall, the patient has pancytopenia for many years and most recently have intermittent leukopenia. 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  He can be anticoagulated or receive antiplatelet agents as long as his platelet count is over 50,000 However, if the procedure is not imminent, I would like to spend some more time investigating the patient this morning.  I have ordered ultrasound of his abdomen for further work-up I will return this afternoon close to 3:00 to see the patient again.  Please call if questions arise

## 2018-11-22 NOTE — Progress Notes (Signed)
RT offered pt CPAP and pt declined at this time stating he does not need to wear. RT will continue to monitor.

## 2018-11-22 NOTE — Progress Notes (Signed)
PROGRESS NOTE  [name] [name]  TKZ:[id number] DOB: 09-Jan-1956 DOA: 11/20/2018 PCP: Terrial Rhodes, MD   Brief Narrative: [name] [name] a 63 y.o.malewith medical history significant forCAD s/p PCI and CABG, IDT2DM, HTN, HLD, TIA,andOSA (not using CPAP)who presents to the ED with chest pain. Patient reports returning to work at [employer] which has involved heavy lifting. He says since then he has noticed about 2 weeks of exertional left-sided chest pain described as a pressure-likesensation that radiates to his left shoulder and left neck but not his jaw or back. During these episodes he has associated dyspnea on exertion, lightheadedness without syncope, and diaphoresis. He has noted occasional palpitations. He says his symptoms resolve withrest. He denies any symptom onset while at rest. He denies any associated nausea, vomiting, abdominal pain.His family visited and convinced him to come to the ED for further evaluation once finding out about the symptoms he has been describing. He was evaluated by cardiology, felt to require repeat cardiac catheterization, but was also found to be leukopenic and thrombocytopenic. Cardiology and [facility] felt a left and right heart catheterization could be considered to investigate his symptoms, however his hematologic abnormalities need to be better understood first as it relates to potential for using dual antiplatelet therapy were he to undergo DES intervention., recommended transfer to [facility] where hematology/oncology was consulted, felt abnormalities were due to fatty liver disease and cleared the patient to have anticoagulation/DAPT as long as platelets remained above 50k. Cardiac catheterization 1/14 showed relatively stable severe 3 vessel CAD with no intervention performed, medical management was recommended.   Assessment & Plan: Principal Problem:   Coronary artery disease involving native coronary artery with unstable angina pectoris  ([place]) Active Problems:   Mixed hyperlipidemia   Essential hypertension, benign   Obstructive sleep apnea-declines C-pap   Type 2 diabetes mellitus (HCC)   Anxiety   Chest pain   Acute kidney injury ([place])   NASH (nonalcoholic steatohepatitis)   Pancytopenia, acquired ([place])  Exertional chest pain in patient with CAD s/p CABG 2017: Cardiac catheterization 1/14 showed stable severe CAD with patent grafts, normal LVEDP.  - Continue medical management per cardiology.   Leukopenia and thrombocytopenia: Suspected by hematology to be due to sequestration ultimately related to FLD. Not felt to be primary marrow disorder without anemia. - Hematology/oncology to follow back up 1/15.  - Monitor CBC in AM  AKI: Cr elevated to 1.28 on admission, since normalized.  - Will recheck BMP in AM following contrast load.  IDT2DM:  - Continue basal-bolus insulin  Obesity: - Weight loss recommended.  NAFLD:  - Hep C Ab ordered.  HTN:  - Continue home medications  Hyperlipidemia:  - Continue statin  OSA:  - CPAP qHS  Asthma: No exacerbation - Monitor, can use prn nebs.  DVT prophylaxis: SCDs Code Status: Full Family Communication: At bedside Disposition Plan: Likely home pending further work up 1/15.  Consultants:   Cardiology  Hematology, oncology  Procedures:  LEFT HEART CATH AND CORS/GRAFTS ANGIOGRAPHY 11/22/2018:   Prox LAD to Mid LAD lesion is 90% stenosed.  Mid LAD lesion is 90% stenosed.  Ost LAD lesion is 50% stenosed.  1st Diag lesion is 80% stenosed.  Mid RCA lesion is 40% stenosed.  Dist RCA lesion is 40% stenosed.  Ost RPDA lesion is 100% stenosed.  SVG and is small.  LIMA and is small.  1st Mrg lesion is 99% stenosed.  Prox Cx lesion is 50% stenosed.  RIMA graft was visualized by angiography  and is normal in caliber.  Origin to Prox Graft lesion is 40% stenosed.  LV end diastolic pressure is normal.   1. Severe 3 vessel obstructive CAD 2.  Patent LIMA to the distal LAD 3. Patent free RIMA to the OM1 4. Patent SVG to PDA 5. Normal LVEDP  Compared to prior angiogram dated 08/09/17 there is no significant change. Recommend continued medical therapy.   Antimicrobials:  None   Subjective: No current chest pain, no dyspnea. No leg swelling. Hungry, wants to be able to eat preCath.  Objective: Vitals:   11/22/18 1626 11/22/18 1631 11/22/18 1636 11/22/18 1641  BP: 114/65 110/64 121/69   Pulse: 61 (!) 59 60 (!) 275  Resp: 11 13 15 17   Temp:      TempSrc:      SpO2: 92% 94% 92% (!) 86%  Weight:      Height:        Intake/Output Summary (Last 24 hours) at 11/22/2018 1705 Last data filed at 11/22/2018 1235 Gross per 24 hour  Intake 102.93 ml  Output 950 ml  Net -847.07 ml   Filed Weights   11/20/18 1507 11/21/18 2103 11/22/18 0420  Weight: 108.9 kg 107.8 kg 108.1 kg    Gen: 63 y.o. male in no distress  Pulm: Non-labored breathing room air. Clear to auscultation bilaterally.  CV: Regular rate and rhythm. No murmur, rub, or gallop. NO JVD, no pedal edema. GI: Abdomen soft, non-tender, no hepatomegaly on palpation, cannot palpate spleen tip, non-distended, with normoactive bowel sounds. No organomegaly or masses felt. Ext: Warm, no deformities Skin: No rashes, lesions or ulcers Neuro: Alert and oriented. No focal neurological deficits. Psych: Judgement and insight appear normal. Mood & affect appropriate.   Data Reviewed: I have personally reviewed following labs and imaging studies  CBC: Recent Labs  Lab 11/20/18 1520 11/21/18 0250 11/22/18 0641  WBC 2.6* 2.5* 2.8*  NEUTROABS 1.3*  --  1.5*  HGB 14.2 13.1 13.1  HCT 41.3 39.0 38.6*  MCV 89.4 90.9 89.4  PLT 88* 73* 77*   Basic Metabolic Panel: Recent Labs  Lab 11/20/18 1520 11/21/18 0250  NA 135 137  K 4.2 3.9  CL 106 108  CO2 20* 21*  GLUCOSE 359* 246*  BUN 19 19  CREATININE 1.28* 0.99  CALCIUM 8.6* 8.2*   GFR: Estimated Creatinine  Clearance: 96.2 mL/min (by C-G formula based on SCr of 0.99 mg/dL). Liver Function Tests: Recent Labs  Lab 11/21/18 0250  AST 32  ALT 33  ALKPHOS 38  BILITOT 0.5  PROT 6.2*  ALBUMIN 3.2*   No results for input(s): LIPASE, AMYLASE in the last 168 hours. No results for input(s): AMMONIA in the last 168 hours. Coagulation Profile: Recent Labs  Lab 11/20/18 1520  INR 1.06   Cardiac Enzymes: Recent Labs  Lab 11/20/18 1520 11/20/18 2240 11/21/18 0250  TROPONINI <0.03 <0.03 <0.03   BNP (last 3 results) No results for input(s): PROBNP in the last 8760 hours. HbA1C: Recent Labs    11/21/18 0251  HGBA1C 7.4*   CBG: Recent Labs  Lab 11/21/18 1619 11/21/18 1936 11/21/18 2114 11/22/18 0747 11/22/18 1140  GLUCAP 106* 175* 161* 116* 102*   Lipid Profile: No results for input(s): CHOL, HDL, LDLCALC, TRIG, CHOLHDL, LDLDIRECT in the last 72 hours. Thyroid Function Tests: No results for input(s): TSH, T4TOTAL, FREET4, T3FREE, THYROIDAB in the last 72 hours. Anemia Panel: Recent Labs    11/22/18 0641  VITAMINB12 222   Urine analysis:  Component Value Date/Time   COLORURINE YELLOW 08/12/2017 1640   APPEARANCEUR CLEAR 08/12/2017 1640   LABSPEC 1.019 08/12/2017 1640   PHURINE 5.0 08/12/2017 1640   GLUCOSEU 50 (A) 08/12/2017 1640   HGBUR NEGATIVE 08/12/2017 1640   BILIRUBINUR NEGATIVE 08/12/2017 1640   KETONESUR NEGATIVE 08/12/2017 1640   PROTEINUR NEGATIVE 08/12/2017 1640   UROBILINOGEN 0.2 12/04/2013 0730   NITRITE NEGATIVE 08/12/2017 1640   LEUKOCYTESUR NEGATIVE 08/12/2017 1640   No results found for this or any previous visit (from the past 240 hour(s)).    Radiology Studies: US Abdomen Complete  Result Date: 11/22/2018 CLINICAL DATA:  Pancytopenia. Evaluate for cirrhosis and splenomegaly EXAM: ABDOMEN ULTRASOUND COMPLETE COMPARISON:  12/29/2012 FINDINGS: Gallbladder: No gallstones or wall thickening visualized. No sonographic Murphy sign noted by  sonographer. Common bile duct: Diameter: Normal caliber, 5 mm Liver: Increased echotexture compatible with fatty infiltration. No focal abnormality or biliary ductal dilatation. Portal vein is patent on color Doppler imaging with normal direction of blood flow towards the liver. IVC: No abnormality visualized. Pancreas: Not well visualized due to overlying bowel gas. Spleen: Mild splenomegaly. Craniocaudal length is upper limits of normal at 12.3 cm, but elevated volume at 517 mL. No focal abnormality. Right Kidney: Length: 11.7 cm. Echogenicity within normal limits. No mass or hydronephrosis visualized. Left Kidney: Length: 11.9 cm. Echogenicity within normal limits. No mass or hydronephrosis visualized. Abdominal aorta: No aneurysm visualized. Other findings: None. IMPRESSION: Fatty infiltration of the liver. Borderline splenomegaly. Electronically Signed   By: Rolm Baptise M.D.   On: 11/22/2018 09:08    Scheduled Meds: . amLODipine  5 mg Oral QHS  . aspirin EC  81 mg Oral Daily  . citalopram  20 mg Oral QHS  . ezetimibe  10 mg Oral QHS  . gabapentin  300 mg Oral TID  . insulin aspart  0-15 Units Subcutaneous TID WC  . insulin aspart  0-5 Units Subcutaneous QHS  . insulin glargine  60 Units Subcutaneous QHS  . insulin glargine  70 Units Subcutaneous Daily  . isosorbide mononitrate  30 mg Oral QHS  . Melatonin  9 mg Oral QHS  . metoprolol tartrate  50 mg Oral QHS  . mometasone-formoterol  2 puff Inhalation BID  . pravastatin  20 mg Oral Daily  . sodium chloride flush  3 mL Intravenous Q12H   Continuous Infusions:   LOS: 2 days   Time spent: 25 minutes.  Patrecia Pour, MD [facility] [email or web address] Password [facility] 11/22/2018, 5:05 PM

## 2018-11-22 NOTE — H&P (View-Only) (Signed)
Progress Note  Patient Name: [name] [name] Date of Encounter: 11/22/2018  Primary Cardiologist: Rozann Lesches, MD   Subjective   No significant overnight events. Patient continues to have some mild left sided chest pain when ambulating to the bathroom. No significant shortness of breath.  Inpatient Medications    Scheduled Meds: . amLODipine  5 mg Oral QHS  . aspirin EC  81 mg Oral Daily  . citalopram  20 mg Oral QHS  . ezetimibe  10 mg Oral QHS  . gabapentin  300 mg Oral TID  . insulin aspart  0-15 Units Subcutaneous TID WC  . insulin aspart  0-5 Units Subcutaneous QHS  . insulin glargine  60 Units Subcutaneous QHS  . insulin glargine  70 Units Subcutaneous Daily  . isosorbide mononitrate  30 mg Oral QHS  . Melatonin  9 mg Oral QHS  . metoprolol tartrate  50 mg Oral QHS  . mometasone-formoterol  2 puff Inhalation BID  . pravastatin  20 mg Oral Daily  . sodium chloride flush  3 mL Intravenous Q12H   Continuous Infusions: . heparin 1,150 Units/hr (11/22/18 0322)   PRN Meds: acetaminophen **OR** acetaminophen, albuterol, morphine injection, nitroGLYCERIN, ondansetron **OR** ondansetron (ZOFRAN) IV   Vital Signs    Vitals:   11/21/18 2103 11/21/18 2332 11/22/18 0420 11/22/18 1200  BP: 138/67 (!) 149/74 131/67 132/77  Pulse: 69 74 65 (!) 59  Resp: 18 18 20 18   Temp: 98.2 F (36.8 C) 98.3 F (36.8 C) 98.3 F (36.8 C) 98.1 F (36.7 C)  TempSrc: Oral Oral Oral Oral  SpO2: 98% 96% 95% 96%  Weight: 107.8 kg  108.1 kg   Height: 5' 9"  (1.753 m)       Intake/Output Summary (Last 24 hours) at 11/22/2018 1356 Last data filed at 11/22/2018 1235 Gross per 24 hour  Intake 462.93 ml  Output 950 ml  Net -487.07 ml   Last 3 Weights 11/22/2018 11/21/2018 11/20/2018  Weight (lbs) 238 lb 4.8 oz 237 lb 11.2 oz 240 lb  Weight (kg) 108.092 kg 107.82 kg 108.863 kg      Telemetry    Sinus rhythm with heart rates in the 60's to 80's. - Personally Reviewed  ECG    No new  ECG tracing today. - Personally Reviewed  Physical Exam   GEN: Caucasian obese male resting comfortably. Alert and in no acute distress.  Neck: Supple. Cardiac: RRR. No murmurs, rubs, or gallops. No chest wall tenderness. Radial and distal pedal pulses 2+ and equal. Respiratory: Clear to auscultation bilaterally. No wheezes, rhonchi, or rales. GI: Soft, non-tender, non-distended. Bowel sounds present. MS: No lower extremity edema. No deformity. Neuro:  No focal deficits. Psych: Normal affect.   Labs    Chemistry Recent Labs  Lab 11/20/18 1520 11/21/18 0250  NA 135 137  K 4.2 3.9  CL 106 108  CO2 20* 21*  GLUCOSE 359* 246*  BUN 19 19  CREATININE 1.28* 0.99  CALCIUM 8.6* 8.2*  PROT  --  6.2*  ALBUMIN  --  3.2*  AST  --  32  ALT  --  33  ALKPHOS  --  38  BILITOT  --  0.5  GFRNONAA >60 >60  GFRAA >60 >60  ANIONGAP 9 8     Hematology Recent Labs  Lab 11/20/18 1520 11/21/18 0250 11/22/18 0641  WBC 2.6* 2.5* 2.8*  RBC 4.62 4.29 4.32  HGB 14.2 13.1 13.1  HCT 41.3 39.0 38.6*  MCV 89.4 90.9  89.4  MCH 30.7 30.5 30.3  MCHC 34.4 33.6 33.9  RDW 12.9 13.2 12.8  PLT 88* 73* 77*    Cardiac Enzymes Recent Labs  Lab 11/20/18 1520 11/20/18 2240 11/21/18 0250  TROPONINI <0.03 <0.03 <0.03   No results for input(s): TROPIPOC in the last 168 hours.   BNPNo results for input(s): BNP, PROBNP in the last 168 hours.   DDimer No results for input(s): DDIMER in the last 168 hours.   Radiology    US Abdomen Complete  Result Date: 11/22/2018 CLINICAL DATA:  Pancytopenia. Evaluate for cirrhosis and splenomegaly EXAM: ABDOMEN ULTRASOUND COMPLETE COMPARISON:  12/29/2012 FINDINGS: Gallbladder: No gallstones or wall thickening visualized. No sonographic Murphy sign noted by sonographer. Common bile duct: Diameter: Normal caliber, 5 mm Liver: Increased echotexture compatible with fatty infiltration. No focal abnormality or biliary ductal dilatation. Portal vein is patent on color  Doppler imaging with normal direction of blood flow towards the liver. IVC: No abnormality visualized. Pancreas: Not well visualized due to overlying bowel gas. Spleen: Mild splenomegaly. Craniocaudal length is upper limits of normal at 12.3 cm, but elevated volume at 517 mL. No focal abnormality. Right Kidney: Length: 11.7 cm. Echogenicity within normal limits. No mass or hydronephrosis visualized. Left Kidney: Length: 11.9 cm. Echogenicity within normal limits. No mass or hydronephrosis visualized. Abdominal aorta: No aneurysm visualized. Other findings: None. IMPRESSION: Fatty infiltration of the liver. Borderline splenomegaly. Electronically Signed   By: Rolm Baptise M.D.   On: 11/22/2018 09:08   Dg Chest Port 1 View  Result Date: 11/20/2018 CLINICAL DATA:  Chest pain EXAM: PORTABLE CHEST 1 VIEW COMPARISON:  10/12/2017 FINDINGS: 1226 hours. The lungs are clear without focal pneumonia, edema, pneumothorax or pleural effusion. The cardiopericardial silhouette is within normal limits for size. Status post CABG. The visualized bony structures of the thorax are intact. Telemetry leads overlie the chest. IMPRESSION: No active disease. Electronically Signed   By: Misty Stanley M.D.   On: 11/20/2018 15:58    Cardiac Studies   Echocardiogram 11/22/2018: Study Conclusions: - Left ventricle: The cavity size was normal. Wall thickness was   increased in a pattern of mild LVH. Systolic function was normal.   The estimated ejection fraction was in the range of 55% to 60%.   Wall motion was normal; there were no regional wall motion   abnormalities. Definity contrast was utilized. Indeterminate   diastolic function. - Aortic valve: There was mild regurgitation. - Mitral valve: Mildly calcified annulus. - Right atrium: Central venous pressure (est): 3 mm Hg. - Atrial septum: No defect or patent foramen ovale was identified. - Tricuspid valve: There was mild regurgitation. - Pulmonary arteries: PA peak  pressure: 25 mm Hg (S). - Pericardium, extracardiac: There was no pericardial effusion.  Patient Profile     63 y.o. male with a history of CAD s/p previous PCI and CABG in 2017, recurrent atypical chest pain, hypertension, hyperlipidemia, type 2 diabetes mellitus, obstructive sleep apnea on CPAP, previous TIA, and chronic thrombocytopenia, who presented to [facility] ED on 11/20/2018 for evaluation of chest pain and dyspnea on exertion for the last couple of weeks. Cardiology was consulted and recommended be transferred to [facility] for hematology consulting regarding his thrombocytopenia and neutropenia prior to considering further cardiac evaluation.   Assessment & Plan    Progressive Dyspnea on Exertion and Chest Pain with Known CAD - Patient s/p CABG with LIMA to LAD, SVG to PDA, and free radial to OM1 in 2017. Cardiac catheterization in  08/2017 showed patent grafts with progression of severe diffuse distal LAD. Medical management was recommended at that time. - EKG showed no acute ischemic changes. - Troponin negative x3. - Patient currently chest pain free at rest but notes continued mild left-sides chest pain with radiation to left shoulder with ambulation to the bathroom. - Currently on IV Heparin drip. - Continue Aspirin, beta-blocker, statin, Zetia, and Imdur. - Per Dr. Myles Gip note yesterday, "left and right heart catheterization could be considered to investigate his symptoms, however his hematologic abnormalities need to be better understood first." Will defer timing to MD.  Hypertension - BP currently well controlled at 132/77.  - Continue current medications: Amlodipine 20m daily, Lopressor 563mtwice daily, and Imdur 3035maily.  Hyperlipidemia - Lipid panel from 06/2018: Cholesterol 114, Triglycerides 250, HDL 26, LDL 38.  - At LDL goal of <70 given CAD. - Continue Pravastatin and Zetia.  Leukopenia and Thrombocytopenia - WBC 2.8 and platelets 77.  - Possibly due to  sequestration form liver cirrhosis with splenomegaly per Hematology note. Abdominal ultrasound has been ordered for further evaluation. - Per Hematology, "If cardiac catheterization is imminent, from my standpoint, there is no contraindication for him to proceed.  He can be anticoagulated or receive antiplatelet agents as long as his platelet count is over 50,000." - Management per primary team and Hematology.  Otherwise, per primary team.  For questions or updates, please contact [facility] consult [email or web address] for contact info under        Signed, CalDarreld McleanA-C  11/22/2018, 1:56 PM    Personally seen and examined. Agree with above.   He still has ongoing chest discomfort with ambulation, unstable anginal-like symptoms.  Troponins have been negative x3.  Dr. McDDomenic Politent him down here for diagnostic angiography.  Hematology has seen him and feels comfortable with him proceeding with heart catheterization as long as platelets remain over 50,000.  He is currently 77,000.  He has been n.p.o.  We have called the catheterization lab.  There is availability today.  We will proceed with heart catheterization.  Risks and benefits have been discussed with patient including bleeding risks.  He and his wife are willing to proceed.  Antianginal medication such as beta-blocker isosorbide have been utilized.  We will stop his IV heparin.  He has received aspirin 81 mg.  Prior heart catheterization 2018: Conclusions: 1. Severe native coronary artery disease, as detailed below. Most likely culprit for the patient's symptoms is progression of severe diffuse distal LAD supplied by the LIMA. 2. Widely patent LIMA to LAD and SVG to RPDA. Free radial to inferior branch of OM1 is patent with approximately 40% ostial stenosis. 3. Mildly elevated left ventricular filling pressure.  Recommendations: 1. Aggressive medical therapy and secondary prevention. There are no interventional  targets at this time. 2. Consider gentle diuresis, given elevated left ventricular filling pressure.  ChrNelva BushD  Previously resulted in medical management.  MarCandee FurbishD

## 2018-11-22 NOTE — Progress Notes (Signed)
ANTICOAGULATION CONSULT NOTE - Follow Up Consult  Pharmacy Consult for Heparin Indication: chest pain/ACS  Allergies  Allergen Reactions  . Divalproex Sodium Other (See Comments)    Causes anger  . Imdur [Isosorbide Dinitrate] Other (See Comments)    Severe headache (trying lower dose 07/05/18)  . Lipitor [Atorvastatin Calcium]     cramps  . Statins Other (See Comments)    Muscle aches and cramps  . Tramadol Other (See Comments)    Chest pain   . Tricor [Fenofibrate] Other (See Comments)    Leg cramps  . Valproic Acid Other (See Comments)    "anger"    Patient Measurements: Height: 5' 9"  (175.3 cm) Weight: 238 lb 4.8 oz (108.1 kg)(scale a) IBW/kg (Calculated) : 70.7 Heparin Dosing Weight: 95 kg  Vital Signs: Temp: 98.3 F (36.8 C) (01/14 0420) Temp Source: Oral (01/14 0420) BP: 131/67 (01/14 0420) Pulse Rate: 65 (01/14 0420)  Labs: Recent Labs    11/20/18 1520 11/20/18 2240 11/21/18 0250  11/21/18 1022 11/21/18 1804 11/22/18 0641 11/22/18 0854  HGB 14.2  --  13.1  --   --   --  13.1  --   HCT 41.3  --  39.0  --   --   --  38.6*  --   PLT 88*  --  73*  --   --   --  77*  --   APTT 35  --   --   --   --   --   --   --   LABPROT 13.8  --   --   --   --   --   --   --   INR 1.06  --   --   --   --   --   --   --   HEPARINUNFRC  --  0.48  --    < > 0.94* 0.75*  --  0.47  CREATININE 1.28*  --  0.99  --   --   --   --   --   TROPONINI <0.03 <0.03 <0.03  --   --   --   --   --    < > = values in this interval not displayed.    Estimated Creatinine Clearance: 96.2 mL/min (by C-G formula based on SCr of 0.99 mg/dL).  Assessment:  63 yr old male continues on IV heparin for ACS/chest pain.  Planning cardiac cath. Known pancytopenia. Platelet count 88K before IV heparin begun.  Hematology notes anticoagulation and antiplatelets okay as long at platelet count >50K.    Heparin level is therapeutic (0.47) today on 1150 units/hr.  Platelet count 77K. No bleeding  reported.  Goal of Therapy:  Heparin level 0.3-0.7 units/ml Monitor platelets by anticoagulation protocol: Yes   Plan:   Continue heparin drip at 1150 units/hr.  Daily heparin level and CBC.  Follow plans/ post-cath.  Arty Baumgartner, [place] Pager: [phone] or phone: [phone] 11/22/2018,10:32 AM

## 2018-11-23 ENCOUNTER — Emergency Department (HOSPITAL_COMMUNITY)
Admission: EM | Admit: 2018-11-23 | Discharge: 2018-11-23 | Disposition: A | Discharge status: Home / Self Care | Payer: Medicare Other | Attending: Emergency Medicine | Admitting: Emergency Medicine

## 2018-11-23 ENCOUNTER — Emergency Department (HOSPITAL_COMMUNITY): Payer: Medicare Other

## 2018-11-23 ENCOUNTER — Encounter (HOSPITAL_COMMUNITY): Payer: Self-pay | Admitting: Cardiology

## 2018-11-23 ENCOUNTER — Other Ambulatory Visit: Payer: Self-pay

## 2018-11-23 DIAGNOSIS — Z7982 Long term (current) use of aspirin: Secondary | ICD-10-CM | POA: Insufficient documentation

## 2018-11-23 DIAGNOSIS — Z794 Long term (current) use of insulin: Secondary | ICD-10-CM | POA: Insufficient documentation

## 2018-11-23 DIAGNOSIS — R109 Unspecified abdominal pain: Secondary | ICD-10-CM | POA: Insufficient documentation

## 2018-11-23 DIAGNOSIS — I259 Chronic ischemic heart disease, unspecified: Secondary | ICD-10-CM | POA: Insufficient documentation

## 2018-11-23 DIAGNOSIS — J45909 Unspecified asthma, uncomplicated: Secondary | ICD-10-CM | POA: Insufficient documentation

## 2018-11-23 DIAGNOSIS — I110 Hypertensive heart disease with heart failure: Secondary | ICD-10-CM | POA: Insufficient documentation

## 2018-11-23 DIAGNOSIS — E119 Type 2 diabetes mellitus without complications: Secondary | ICD-10-CM | POA: Insufficient documentation

## 2018-11-23 DIAGNOSIS — Z79899 Other long term (current) drug therapy: Secondary | ICD-10-CM | POA: Insufficient documentation

## 2018-11-23 DIAGNOSIS — I509 Heart failure, unspecified: Secondary | ICD-10-CM | POA: Insufficient documentation

## 2018-11-23 LAB — CBC
HCT: 39 % (ref 39.0–52.0)
HCT: 47.4 % (ref 39.0–52.0)
HEMOGLOBIN: 13.5 g/dL (ref 13.0–17.0)
Hemoglobin: 16.4 g/dL (ref 13.0–17.0)
MCH: 31.5 pg (ref 26.0–34.0)
MCH: 30.7 pg (ref 26.0–34.0)
MCHC: 34.6 g/dL (ref 30.0–36.0)
MCHC: 34.6 g/dL (ref 30.0–36.0)
MCV: 90.9 fL (ref 80.0–100.0)
MCV: 88.6 fL (ref 80.0–100.0)
Platelets: 73 10*3/uL — ABNORMAL LOW (ref 150–400)
Platelets: 76 10*3/uL — ABNORMAL LOW (ref 150–400)
RBC: 4.29 MIL/uL (ref 4.22–5.81)
RBC: 5.35 MIL/uL (ref 4.22–5.81)
RDW: 13.2 % (ref 11.5–15.5)
RDW: 13.1 % (ref 11.5–15.5)
WBC: 2.7 10*3/uL — ABNORMAL LOW (ref 4.0–10.5)
WBC: 5.6 10*3/uL (ref 4.0–10.5)
nRBC: 0 % (ref 0.0–0.2)
nRBC: 0 % (ref 0.0–0.2)

## 2018-11-23 LAB — COMPREHENSIVE METABOLIC PANEL
ALT: 37 U/L (ref 0–44)
AST: 38 U/L (ref 15–41)
Albumin: 4.1 g/dL (ref 3.5–5.0)
Alkaline Phosphatase: 58 U/L (ref 38–126)
Anion gap: 12 (ref 5–15)
BUN: 17 mg/dL (ref 6–20)
CO2: 19 mmol/L — ABNORMAL LOW (ref 22–32)
Calcium: 9.5 mg/dL (ref 8.9–10.3)
Chloride: 105 mmol/L (ref 98–111)
Creatinine, Ser: 0.81 mg/dL (ref 0.61–1.24)
GFR calc Af Amer: >60 mL/min (ref >60)
GFR calc non Af Amer: >60 mL/min (ref >60)
Glucose, Bld: 171 mg/dL — ABNORMAL HIGH (ref 70–99)
Potassium: 3.6 mmol/L (ref 3.5–5.1)
Sodium: 136 mmol/L (ref 135–145)
Total Bilirubin: 0.8 mg/dL (ref 0.3–1.2)
Total Protein: 8.4 g/dL — ABNORMAL HIGH (ref 6.5–8.1)

## 2018-11-23 LAB — TROPONIN I: Troponin I: <0.03 ng/mL (ref <0.03)

## 2018-11-23 LAB — BASIC METABOLIC PANEL
ANION GAP: 9 (ref 5–15)
BUN: 12 mg/dL (ref 6–20)
CO2: 25 mmol/L (ref 22–32)
Calcium: 8.8 mg/dL — ABNORMAL LOW (ref 8.9–10.3)
Chloride: 107 mmol/L (ref 98–111)
Creatinine, Ser: 0.97 mg/dL (ref 0.61–1.24)
GFR calc Af Amer: >60 mL/min (ref >60)
GFR calc non Af Amer: >60 mL/min (ref >60)
Glucose, Bld: 105 mg/dL — ABNORMAL HIGH (ref 70–99)
POTASSIUM: 3.9 mmol/L (ref 3.5–5.1)
Sodium: 141 mmol/L (ref 135–145)

## 2018-11-23 LAB — URINALYSIS, ROUTINE W REFLEX MICROSCOPIC
Bilirubin Urine: NEGATIVE
Glucose, UA: NEGATIVE mg/dL
Hgb urine dipstick: NEGATIVE
Ketones, ur: NEGATIVE mg/dL
Leukocytes, UA: NEGATIVE
Nitrite: NEGATIVE
Protein, ur: 100 mg/dL — AB
Specific Gravity, Urine: 1.014 (ref 1.005–1.030)
pH: 8 (ref 5.0–8.0)

## 2018-11-23 LAB — LIPASE, BLOOD: Lipase: 36 U/L (ref 11–51)

## 2018-11-23 LAB — GLUCOSE, CAPILLARY
GLUCOSE-CAPILLARY: 144 mg/dL — AB (ref 70–99)
Glucose-Capillary: 78 mg/dL (ref 70–99)

## 2018-11-23 MED ORDER — HYDROMORPHONE HCL 1 MG/ML IJ SOLN: 1.0000 mg | Freq: Once | INTRAMUSCULAR | Status: DC

## 2018-11-23 MED ORDER — LORAZEPAM 2 MG/ML IJ SOLN
1.0000 mg | Freq: Once | INTRAMUSCULAR | Status: AC
  Administered 2018-11-23: 1 mg via INTRAMUSCULAR
  Filled 2018-11-23: qty 1

## 2018-11-23 MED ORDER — FUROSEMIDE 20 MG PO TABS: 20.0000 mg | ORAL_TABLET | Freq: Every day | ORAL | 1 refills | Status: DC

## 2018-11-23 MED ORDER — PROMETHAZINE HCL 25 MG/ML IJ SOLN
12.5000 mg | Freq: Once | INTRAMUSCULAR | Status: AC
  Administered 2018-11-23: 12.5 mg via INTRAMUSCULAR
  Filled 2018-11-23: qty 1

## 2018-11-23 MED ORDER — HYDROMORPHONE HCL 1 MG/ML IJ SOLN
1.0000 mg | Freq: Once | INTRAMUSCULAR | Status: AC
  Administered 2018-11-23: 1 mg via INTRAMUSCULAR
  Filled 2018-11-23: qty 1

## 2018-11-23 MED ORDER — METFORMIN HCL 1000 MG PO TABS: 1000.0000 mg | ORAL_TABLET | Freq: Two times a day (BID) | ORAL | 11 refills | Status: DC

## 2018-11-23 MED ORDER — DICYCLOMINE HCL 10 MG/ML IM SOLN
20.0000 mg | Freq: Once | INTRAMUSCULAR | Status: AC
  Administered 2018-11-23: 20 mg via INTRAMUSCULAR
  Filled 2018-11-23: qty 2

## 2018-11-23 NOTE — ED Notes (Signed)
Patient has had EKG done 2040. Patient on 12 lead at this time. Patient is very anxious and squirming all over the bed. Patient is very rude toward staff when trying to perform tasks on him.

## 2018-11-23 NOTE — Discharge Instructions (Signed)
Follow with Primary MD Terrial Rhodes, MD in 7 days   Get CBC, CMP,  checked  by Primary MD next visit.    Activity: As tolerated with Full fall precautions use walker/cane & assistance as needed   Disposition Home    Diet: Heart Healthy , carb modified, with feeding assistance and aspiration precautions.  For Heart failure patients - Check your Weight same time everyday, if you gain over 2 pounds, or you develop in leg swelling, experience more shortness of breath or chest pain, call your Primary MD immediately. Follow Cardiac Low Salt Diet and 1.5 lit/day fluid restriction.   On your next visit with your primary care physician please Get Medicines reviewed and adjusted.   Please request your Prim.MD to go over all Hospital Tests and Procedure/Radiological results at the follow up, please get all Hospital records sent to your Prim MD by signing hospital release before you go home.   If you experience worsening of your admission symptoms, develop shortness of breath, life threatening emergency, suicidal or homicidal thoughts you must seek medical attention immediately by calling 911 or calling your MD immediately  if symptoms less severe.  You Must read complete instructions/literature along with all the possible adverse reactions/side effects for all the Medicines you take and that have been prescribed to you. Take any new Medicines after you have completely understood and accpet all the possible adverse reactions/side effects.   Do not drive, operating heavy machinery, perform activities at heights, swimming or participation in water activities or provide baby sitting services if your were admitted for syncope or siezures until you have seen by Primary MD or a Neurologist and advised to do so again.  Do not drive when taking Pain medications.    Do not take more than prescribed Pain, Sleep and Anxiety Medications  Special Instructions: If you have smoked or chewed Tobacco  in  the last 2 yrs please stop smoking, stop any regular Alcohol  and or any Recreational drug use.  Wear Seat belts while driving.   Please note  You were cared for by a hospitalist during your hospital stay. If you have any questions about your discharge medications or the care you received while you were in the hospital after you are discharged, you can call the unit and asked to speak with the hospitalist on call if the hospitalist that took care of you is not available. Once you are discharged, your primary care physician will handle any further medical issues. Please note that NO REFILLS for any discharge medications will be authorized once you are discharged, as it is imperative that you return to your primary care physician (or establish a relationship with a primary care physician if you do not have one) for your aftercare needs so that they can reassess your need for medications and monitor your lab values.

## 2018-11-23 NOTE — ED Provider Notes (Signed)
[facility] EMERGENCY DEPARTMENT Provider Note   CSN: [id number] Arrival date & time: 11/23/18  2026     History   Chief Complaint Chief Complaint  Patient presents with  . Abdominal Pain    epigastric    HPI [name] [name] is a 63 y.o. male.  HPI   85yM with abdominal pain. Onset shortly before arrival. He actually was discharged from the hospital today after being admitted with angina. Had stable heart cath. Was feeling ok at the time he left. This evening he was having a bowel movement when he suddenly began to have severe abdominal pain. Waxes and wanes but doesn't go away. Sometimes the pain is extremely intense and he cannot sit still and will become diaphoretic. It will then ease and before worsening again. No appreciable exacerbating or relieving factors. Mild nausea. No vomiting.   Past Medical History:  Diagnosis Date  . Anxiety   . Arthritis   . Asthma   . Cataract    Right eye  . CHF (congestive heart failure) ([place])   . Chronic lower back pain   . Coronary atherosclerosis of native coronary artery    a. LAD & OM stenting;  b. 2007 Cath: nonobs dzs, patent stents;  c. 07/2012 neg MV, EF 61%.  d. 11/20/14: [place] s/p  PCI w/ DES to pLAD and DES to OM1   . Essential hypertension, benign   . GERD (gastroesophageal reflux disease)   . Hearing loss of left ear   . History of gout   . History of hiatal hernia   . Hypercholesteremia   . Lumbar herniated disc   . MI, old   . Migraine   . OSA on CPAP   . 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 Thrombocytopenia ([place])   . TIA (transient ischemic attack) ~ 2010  . Type 2 diabetes mellitus [facility])     Patient Active Problem List   Diagnosis Date Noted  . NASH (nonalcoholic steatohepatitis)   . Pancytopenia, acquired ([place])   . Acute kidney injury ([place]) 11/20/2018  . Aphasia 07/09/2018  . Cerebral thrombosis with cerebral infarction 07/09/2018  . Chest  pain 08/10/2017  . 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 Coronary artery disease involving native coronary artery with unstable angina pectoris ([place])   . CAD -S/P PCI LAD OM/LAD '13 and 2016 12/31/2015  . Hyperlipidemia   . Chest pain with high risk of acute coronary syndrome 12/23/2015  . Precordial chest pain 12/03/2014  . TIA (transient ischemic attack)   . CAD- diffuse LAD disease at cath 12/30/15   . Thrombocytopenia ([place])   . Accelerating angina ([place]) 12/03/2013  . Type 2 diabetes mellitus ([place])   . Anxiety   . PVC's (premature ventricular contractions) 01/06/2013  . Obstructive sleep apnea-declines C-pap 07/04/2012  . Mixed hyperlipidemia 07/30/2009  . Essential hypertension, benign 07/30/2009    Past Surgical History:  Procedure Laterality Date  . ANTERIOR CERVICAL DECOMP/DISCECTOMY FUSION  1998   "C3-4"  . CARDIAC CATHETERIZATION  "several"  . CARDIAC CATHETERIZATION N/A 12/30/2015   Procedure: Left Heart Cath and Coronary Angiography;  Surgeon: [name], MD;  Location: [place] CV LAB;  Service: Cardiovascular;  Laterality: N/A;  . CARPAL TUNNEL RELEASE Bilateral 2005  . COLONOSCOPY    . CORONARY ANGIOPLASTY    . CORONARY ANGIOPLASTY WITH STENT PLACEMENT  2002; 2003; 11/20/2014   "  I have 4 stents after today" (11/20/2014)  . CORONARY ARTERY BYPASS GRAFT N/A 01/09/2016   Procedure: CORONARY ARTERY BYPASS GRAFTING (CABG) X 3 UTILIZING RIGHT AND LEFT INTERNAL MAMMARY ARTERY AND ENDOSCOPICALLY HARVESTED SAPHENEOUS VEIN.;  Surgeon: Rexene Alberts, MD;  Location: [place];  Service: Open Heart Surgery;  Laterality: N/A;  . ESOPHAGOGASTRODUODENOSCOPY    . KNEE SURGERY Left 02/2012   "scraped; open"  . LEFT HEART CATH AND CORS/GRAFTS ANGIOGRAPHY N/A 08/09/2017   Procedure: LEFT HEART CATH AND CORS/GRAFTS ANGIOGRAPHY;  Surgeon: [name], MD;  Location: [place] CV LAB;  Service: Cardiovascular;  Laterality: N/A;  . LEFT HEART CATH AND CORS/GRAFTS ANGIOGRAPHY N/A 11/22/2018    Procedure: LEFT HEART CATH AND CORS/GRAFTS ANGIOGRAPHY;  Surgeon: [name], [name], MD;  Location: [place] CV LAB;  Service: Cardiovascular;  Laterality: N/A;  . LEFT HEART CATHETERIZATION WITH CORONARY ANGIOGRAM N/A 07/20/2012   Procedure: LEFT HEART CATHETERIZATION WITH CORONARY ANGIOGRAM;  Surgeon: [name], MD;  Location: [place] CATH LAB;  Service: Cardiovascular;  Laterality: N/A;  . LEFT HEART CATHETERIZATION WITH CORONARY ANGIOGRAM N/A 11/20/2014   Procedure: LEFT HEART CATHETERIZATION WITH CORONARY ANGIOGRAM;  Surgeon: [name], MD;  Location: [facility] CATH LAB;  Service: Cardiovascular;  Laterality: N/A;  . LEFT HEART CATHETERIZATION WITH CORONARY ANGIOGRAM N/A 11/26/2014   Procedure: LEFT HEART CATHETERIZATION WITH CORONARY ANGIOGRAM;  Surgeon: [name], MD;  Location: [facility] CATH LAB;  Service: Cardiovascular;  Laterality: N/A;  . NEUROPLASTY / TRANSPOSITION ULNAR NERVE AT ELBOW Right ~ 2012  . PERCUTANEOUS CORONARY ROTOBLATOR INTERVENTION (PCI-R)  11/20/2014   Procedure: PERCUTANEOUS CORONARY ROTOBLATOR INTERVENTION (PCI-R);  Surgeon: [name], MD;  Location: [facility] CATH LAB;  Service: Cardiovascular;;  . SHOULDER ARTHROSCOPY Left ~ 2011  . TEE WITHOUT CARDIOVERSION N/A 01/09/2016   Procedure: TRANSESOPHAGEAL ECHOCARDIOGRAM (TEE);  Surgeon: Rexene Alberts, MD;  Location: [place];  Service: Open Heart Surgery;  Laterality: N/A;        Home Medications    Prior to Admission medications   Medication Sig Start Date End Date Taking? Authorizing Provider  albuterol (PROVENTIL HFA;VENTOLIN HFA) 108 (90 BASE) MCG/ACT inhaler Inhale 2 puffs into the lungs every 6 (six) hours as needed for wheezing or shortness of breath.   Yes [provider]  amLODipine (NORVASC) 5 MG tablet Take 5 mg by mouth at bedtime. 06/16/18  Yes [provider]  aspirin EC 81 MG tablet Take 81 mg by mouth at bedtime.    Yes [provider]  cetirizine (ZYRTEC) 10 MG tablet Take 10 mg  by mouth at bedtime.   Yes [provider]  citalopram (CELEXA) 20 MG tablet Take 20 mg by mouth at bedtime.    Yes [provider]  ezetimibe (ZETIA) 10 MG tablet Take 10 mg by mouth at bedtime. 07/05/18  Yes [provider]  fluticasone (FLONASE) 50 MCG/ACT nasal spray Place 1 spray into the nose 2 (two) times daily.    Yes [provider]  Fluticasone-Salmeterol (ADVAIR) 250-50 MCG/DOSE AEPB Inhale 1 puff into the lungs every 12 (twelve) hours.   Yes [provider]  furosemide (LASIX) 20 MG tablet Take 1 tablet (20 mg total) by mouth daily. 11/23/18  Yes Elgergawy, Silver Huguenin, MD  gabapentin (NEURONTIN) 300 MG capsule Take 300 mg by mouth 3 (three) times daily. 07/09/17  Yes [provider]  insulin aspart (NOVOLOG FLEXPEN) 100 UNIT/ML FlexPen Inject 2-30 Units into the skin 3 (three) times daily as  needed for high blood sugar (CBG >150). Per sliding scale written down at home    Yes [provider]  isosorbide mononitrate (IMDUR) 30 MG 24 hr tablet Take 30 mg by mouth at bedtime. 06/16/18  Yes [provider]  Melatonin 5 MG TABS Take 10 mg by mouth at bedtime.    Yes [provider]  metFORMIN (GLUCOPHAGE) 1000 MG tablet Take 1 tablet (1,000 mg total) by mouth 2 (two) times daily. For the next 2 days as you did receive IV contrast, then resume on 11/26/2018 11/23/18  Yes Elgergawy, Silver Huguenin, MD  metoprolol tartrate (LOPRESSOR) 50 MG tablet Take 50 mg by mouth at bedtime. 07/05/18  Yes [provider]  nitroGLYCERIN (NITROSTAT) 0.4 MG SL tablet Place 1 tablet (0.4 mg total) under the tongue every 5 (five) minutes as needed for chest pain. 08/13/17 11/23/18 Yes [name], MD  omeprazole (PRILOSEC) 20 MG capsule Take 20 mg by mouth 2 (two) times daily before a meal.   Yes [provider]  pravastatin (PRAVACHOL) 20 MG tablet Take 1 tablet (20 mg total) by mouth daily. 09/13/18  Yes [name], MD    topiramate (TOPAMAX) 25 MG capsule Take 25 mg by mouth 2 (two) times daily.   Yes [provider]  TRESIBA FLEXTOUCH 100 UNIT/ML SOPN FlexTouch Pen Inject 60-70 Units into the skin See admin instructions. 60 units in the morning and 70 units at bedtime 08/11/18  Yes [provider]    Family History Family History  Problem Relation Age of Onset  . Coronary artery disease Father 80    Social History Social History   Tobacco Use  . Smoking status: Never Smoker  . Smokeless tobacco: Never Used  Substance Use Topics  . Alcohol use: No  . Drug use: No     Allergies   Divalproex sodium; Imdur [isosorbide dinitrate]; Lipitor [atorvastatin calcium]; Statins; Tramadol; Tricor [fenofibrate]; and Valproic acid   Review of Systems Review of Systems  All systems reviewed and negative, other than as noted in HPI.  Physical Exam Updated Vital Signs BP (!) 187/92   Pulse 73   Resp 18   SpO2 96%   Physical Exam Vitals signs and nursing note reviewed.  Constitutional:      Appearance: He is well-developed. He is obese. He is ill-appearing and diaphoretic.     Comments: Appears uncomfortable. Shifting position frequently.   HENT:     Head: Normocephalic and atraumatic.  Eyes:     General:        Right eye: No discharge.        Left eye: No discharge.     Conjunctiva/sclera: Conjunctivae normal.  Neck:     Musculoskeletal: Neck supple.  Cardiovascular:     Rate and Rhythm: Normal rate and regular rhythm.     Heart sounds: Normal heart sounds. No murmur. No friction rub. No gallop.   Pulmonary:     Effort: Pulmonary effort is normal. No respiratory distress.     Breath sounds: Normal breath sounds.  Abdominal:     General: There is no distension.     Palpations: Abdomen is soft.     Tenderness: There is no abdominal tenderness.  Musculoskeletal:        General: No tenderness.  Skin:    General: Skin is warm.  Neurological:     Mental Status: He is  alert.  Psychiatric:        Behavior: Behavior normal.  Thought Content: Thought content normal.      ED Treatments / Results  Labs (all labs ordered are listed, but only abnormal results are displayed) Labs Reviewed  COMPREHENSIVE METABOLIC PANEL - Abnormal; Notable for the following components:      Result Value   CO2 19 (*)    Glucose, Bld 171 (*)    Total Protein 8.4 (*)    All other components within normal limits  CBC - Abnormal; Notable for the following components:   Platelets 76 (*)    All other components within normal limits  URINALYSIS, ROUTINE W REFLEX MICROSCOPIC - Abnormal; Notable for the following components:   Protein, ur 100 (*)    Bacteria, UA RARE (*)    All other components within normal limits  LIPASE, BLOOD  TROPONIN I    EKG EKG Interpretation  Date/Time:  Wednesday November 23 2018 20:40:56 EST Ventricular Rate:  78 PR Interval:    QRS Duration: 95 QT Interval:  423 QTC Calculation: 482 R Axis:   56 Text Interpretation:  Sinus rhythm Probable left atrial enlargement Borderline prolonged QT interval Confirmed by Virgel Manifold [phone]) on 11/23/2018 9:09:46 PM   Radiology Ct Abdomen Pelvis Wo Contrast  Result Date: 11/23/2018 CLINICAL DATA:  63 year old male with epigastric pain. Recent heart catheterization yesterday. EXAM: CT ABDOMEN AND PELVIS WITHOUT CONTRAST TECHNIQUE: Multidetector CT imaging of the abdomen and pelvis was performed following the standard protocol without IV contrast. COMPARISON:  Abdominal ultrasound performed yesterday 11/22/2018 FINDINGS: Lower chest: The visualized cardiac structures are within normal limits for size. No pericardial effusion. Atherosclerotic vascular calcifications. Patient is status post median sternotomy. Unremarkable distal esophagus. The lung bases are clear. Hepatobiliary: Low attenuation of the hepatic parenchyma consistent with steatosis. Relative hypertrophy of the caudate and left hepatic lobe  consistent with cirrhotic change. Gallbladder is unremarkable. No intra or extrahepatic biliary ductal dilatation. Pancreas: Unremarkable. No pancreatic ductal dilatation or surrounding inflammatory changes. Spleen: Normal in size without focal abnormality. Adrenals/Urinary Tract: Unremarkable adrenal glands. No evidence of hydronephrosis or nephrolithiasis. Unremarkable ureters and bladder. Stomach/Bowel: No evidence of obstruction or focal bowel wall thickening. Normal appendix in the right lower quadrant. The terminal ileum is unremarkable. Vascular/Lymphatic: Limited evaluation in the absence of intravenous contrast. Scattered atherosclerotic vascular calcifications throughout the abdominal aorta. No evidence of aneurysm. Reproductive: Prostate is unremarkable. Other: No abdominal wall hernia or abnormality. No abdominopelvic ascites. Musculoskeletal: No acute fracture or aggressive appearing lytic or blastic osseous lesion. IMPRESSION: 1. No acute abnormality within the abdomen or pelvis. 2. Hepatic steatosis with evidence of hepatic cirrhosis. 3.  Aortic Atherosclerosis (ICD10-170.0) Electronically Signed   By: Jacqulynn Cadet M.D.   On: 11/23/2018 21:59   US Abdomen Complete  Result Date: 11/22/2018 CLINICAL DATA:  Pancytopenia. Evaluate for cirrhosis and splenomegaly EXAM: ABDOMEN ULTRASOUND COMPLETE COMPARISON:  12/29/2012 FINDINGS: Gallbladder: No gallstones or wall thickening visualized. No sonographic Murphy sign noted by sonographer. Common bile duct: Diameter: Normal caliber, 5 mm Liver: Increased echotexture compatible with fatty infiltration. No focal abnormality or biliary ductal dilatation. Portal vein is patent on color Doppler imaging with normal direction of blood flow towards the liver. IVC: No abnormality visualized. Pancreas: Not well visualized due to overlying bowel gas. Spleen: Mild splenomegaly. Craniocaudal length is upper limits of normal at 12.3 cm, but elevated volume at 517 mL.  No focal abnormality. Right Kidney: Length: 11.7 cm. Echogenicity within normal limits. No mass or hydronephrosis visualized. Left Kidney: Length: 11.9 cm. Echogenicity within normal limits. No  mass or hydronephrosis visualized. Abdominal aorta: No aneurysm visualized. Other findings: None. IMPRESSION: Fatty infiltration of the liver. Borderline splenomegaly. Electronically Signed   By: Rolm Baptise M.D.   On: 11/22/2018 09:08    Procedures Procedures (including critical care time)  Medications Ordered in ED Medications  LORazepam (ATIVAN) injection 1 mg (1 mg Intramuscular Given 11/23/18 2133)  HYDROmorphone (DILAUDID) injection 1 mg (1 mg Intramuscular Given 11/23/18 2134)  promethazine (PHENERGAN) injection 12.5 mg (12.5 mg Intramuscular Given 11/23/18 2133)  dicyclomine (BENTYL) injection 20 mg (20 mg Intramuscular Given 11/23/18 2311)     Initial Impression / Assessment and Plan / ED Course  I have reviewed the triage vital signs and the nursing notes.  Pertinent labs & imaging results that were available during my care of the patient were reviewed by me and considered in my medical decision making (see chart for details).     62yM with abdominal pain. Now much better. He arrived and seemed extremely uncomfortable but exam was actually pretty benign. CT fine. Had [place] yesterday which was fairly unremarkable. LFTs fine. Still having some symptoms but currently feels much better than at the time of arrival. I doubt emergent process.   It has been determined that no acute conditions requiring further emergency intervention are present at this time. The patient has been advised of the diagnosis and plan. I reviewed any labs and imaging including any potential incidental findings. I have reviewed nursing notes and appropriate previous records. We have discussed signs and symptoms that warrant return to the ED and they are listed in the discharge instructions.      Final Clinical Impressions(s)  / ED Diagnoses   Final diagnoses:  Abdominal pain, unspecified abdominal location    ED Discharge Orders    None       Virgel Manifold, MD 11/27/18 [phone]

## 2018-11-23 NOTE — ED Notes (Signed)
CT okay to do without IV contrast per Dr Wilson Singer

## 2018-11-23 NOTE — ED Triage Notes (Signed)
Pt reports epigastric pressure that started just prior to arrival via EMS- pt has significant cardiac hx with cardiac cath performed yesterday at [facility]. Pt says the last thing he ate was a big spoonful of peanut butter this afternoon. Pt describes pain as "pressure" and says he feels like " if he could vomit, he would fell better".  Pt reports diaphoresis, but no other symptoms. Pt says pain is different than when he had MI. Pt also reports "belching a lot". Pt has taken omeprazole per report.

## 2018-11-23 NOTE — Plan of Care (Signed)
  Problem: Education: Goal: Knowledge of General Education information will improve Description Including pain rating scale, medication(s)/side effects and non-pharmacologic comfort measures Outcome: Progressing   Problem: Clinical Measurements: Goal: Will remain free from infection Outcome: Progressing   Problem: Activity: Goal: Risk for activity intolerance will decrease Outcome: Progressing   Problem: Nutrition: Goal: Adequate nutrition will be maintained Outcome: Progressing   Problem: Safety: Goal: Ability to remain free from injury will improve Outcome: Progressing

## 2018-11-23 NOTE — ED Notes (Signed)
Pt is thrashing around in bed, unable to keep still. Is yelling at staff. MD notified we are unable to obtain IV access d/t pt's inability to sit still.

## 2018-11-23 NOTE — Progress Notes (Signed)
[name] [name]   DOB:12/13/1955   EU#:[id number]    Assessment & Plan:   Chronic leukopenia and thrombocytopenia with splenomegaly The cause of the leukopenia and thrombocytopenia is related to sequestration from splenomegaly and fatty liver disease There is absence of anemia, so unlikely due to primary bone marrow disorder He does not need further follow-up in this regard There is no contraindication to remain on antiplatelet agents or anticoagulants as long as the platelet is greater than 50,000.  Fatty liver disease with splenomegaly This is due to poorly controlled diabetes, poor dietary choices and obesity I have extensive discussion with the patient about the importance of getting his diet and diabetes under controlled.  I will order screening test for hepatitis C antibody, result is pending  Coronary artery disease Will defer to primary service and cardiologist for further management  Discharge planning Will defer to primary service The patient will likely need hematology outpatient follow-up in the future within a month of discharge The patient would like follow-up at [place].  I will make the referral I will sign off.  Please call if questions arise  Heath Lark, MD 11/23/2018  8:35 AM   Subjective:  He is seen this morning.  His wife is present.  He denies symptoms of chest pain or shortness of breath. The patient denies any recent signs or symptoms of bleeding such as spontaneous epistaxis, hematuria or hematochezia.   Objective:  Vitals:   11/23/18 0047 11/23/18 0537  BP: 122/61 129/65  Pulse: 62 (!) 57  Resp: 18 18  Temp: 98.3 F (36.8 C) 98.1 F (36.7 C)  SpO2: 93% 96%     Intake/Output Summary (Last 24 hours) at 11/23/2018 0835 Last data filed at 11/23/2018 0535 Gross per 24 hour  Intake 1245.33 ml  Output 1400 ml  Net -154.67 ml    GENERAL:alert, no distress and comfortable NEURO: alert & oriented x 3 with fluent speech, no focal  motor/sensory deficits   Labs:  Lab Results  Component Value Date   WBC 2.7 (L) 11/23/2018   HGB 13.5 11/23/2018   HCT 39.0 11/23/2018   MCV 90.9 11/23/2018   PLT 73 (L) 11/23/2018   NEUTROABS 1.5 (L) 11/22/2018    Lab Results  Component Value Date   NA 141 11/23/2018   K 3.9 11/23/2018   CL 107 11/23/2018   CO2 25 11/23/2018    Studies:  US Abdomen Complete  Result Date: 11/22/2018 CLINICAL DATA:  Pancytopenia. Evaluate for cirrhosis and splenomegaly EXAM: ABDOMEN ULTRASOUND COMPLETE COMPARISON:  12/29/2012 FINDINGS: Gallbladder: No gallstones or wall thickening visualized. No sonographic Murphy sign noted by sonographer. Common bile duct: Diameter: Normal caliber, 5 mm Liver: Increased echotexture compatible with fatty infiltration. No focal abnormality or biliary ductal dilatation. Portal vein is patent on color Doppler imaging with normal direction of blood flow towards the liver. IVC: No abnormality visualized. Pancreas: Not well visualized due to overlying bowel gas. Spleen: Mild splenomegaly. Craniocaudal length is upper limits of normal at 12.3 cm, but elevated volume at 517 mL. No focal abnormality. Right Kidney: Length: 11.7 cm. Echogenicity within normal limits. No mass or hydronephrosis visualized. Left Kidney: Length: 11.9 cm. Echogenicity within normal limits. No mass or hydronephrosis visualized. Abdominal aorta: No aneurysm visualized. Other findings: None. IMPRESSION: Fatty infiltration of the liver. Borderline splenomegaly. Electronically Signed   By: Rolm Baptise M.D.   On: 11/22/2018 09:08

## 2018-11-23 NOTE — Discharge Summary (Signed)
[name] [name], is a 63 y.o. male  DOB [birth year]  MRN [id number].  Admission date:  11/20/2018  Admitting Physician  Lenore Cordia, MD  Discharge Date:  11/23/2018   Primary MD  Terrial Rhodes, MD  Recommendations for primary care physician for things to follow:  -Check CBC, BMP during next visit -please follow on the final results of hepatitis C antibody   Admission Diagnosis  Unstable angina ([place]) [I20.0]   Discharge Diagnosis  Unstable angina ([place]) [I20.0]    Principal Problem:   Coronary artery disease involving native coronary artery with unstable angina pectoris ([place]) Active Problems:   Mixed hyperlipidemia   Essential hypertension, benign   Obstructive sleep apnea-declines C-pap   Type 2 diabetes mellitus ([place])   Anxiety   Chest pain   Acute kidney injury ([place])   NASH (nonalcoholic steatohepatitis)   Pancytopenia, acquired ([place])      Past Medical History:  Diagnosis Date  . Anxiety   . Arthritis   . Asthma   . Cataract    Right eye  . CHF (congestive heart failure) ([place])   . Chronic lower back pain   . Coronary atherosclerosis of native coronary artery    a. LAD & OM stenting;  b. 2007 Cath: nonobs dzs, patent stents;  c. 07/2012 neg MV, EF 61%.  d. 11/20/14: [place] s/p  PCI w/ DES to pLAD and DES to OM1   . Essential hypertension, benign   . GERD (gastroesophageal reflux disease)   . Hearing loss of left ear   . History of gout   . History of hiatal hernia   . Hypercholesteremia   . Lumbar herniated disc   . MI, old   . Migraine   . OSA on CPAP   . S/P [name] x 3 01/09/2016   LIMA to LAD, free RIMA to OM2, SVG to PDA, open SVG harvest from right thigh  . Thrombocytopenia ([place])   . [name] [name] (transient ischemic attack) ~ 2010  . Type 2 diabetes mellitus ()     Past Surgical History:  Procedure Laterality Date  . ANTERIOR CERVICAL DECOMP/DISCECTOMY FUSION  1998   "C3-4"    . CARDIAC CATHETERIZATION  "several"  . CARDIAC CATHETERIZATION N/A 12/30/2015   Procedure: Left Heart Cath and Coronary Angiography;  Surgeon: [name], MD;  Location: [facility];  Service: Cardiovascular;  Laterality: N/A;  . CARPAL TUNNEL RELEASE Bilateral 2005  . COLONOSCOPY    . CORONARY ANGIOPLASTY    . CORONARY ANGIOPLASTY WITH STENT PLACEMENT  2002; 2003; 11/20/2014   "I have 4 stents after today" (11/20/2014)  . CORONARY ARTERY BYPASS GRAFT N/A 01/09/2016   Procedure: CORONARY ARTERY BYPASS GRAFTING ([name]) X 3 UTILIZING RIGHT AND LEFT INTERNAL MAMMARY ARTERY AND ENDOSCOPICALLY HARVESTED SAPHENEOUS VEIN.;  Surgeon: Rexene Alberts, MD;  Location: [facility];  Service: Open Heart Surgery;  Laterality: N/A;  . ESOPHAGOGASTRODUODENOSCOPY    . KNEE SURGERY Left 02/2012   "scraped; open"  . LEFT HEART CATH  AND CORS/GRAFTS ANGIOGRAPHY N/A 08/09/2017   Procedure: LEFT HEART CATH AND CORS/GRAFTS ANGIOGRAPHY;  Surgeon: [name], MD;  Location: [facility] CV LAB;  Service: Cardiovascular;  Laterality: N/A;  . LEFT HEART CATH AND CORS/GRAFTS ANGIOGRAPHY N/A 11/22/2018   Procedure: LEFT HEART CATH AND CORS/GRAFTS ANGIOGRAPHY;  Surgeon: [name], [name], MD;  Location: [facility] LAB;  Service: Cardiovascular;  Laterality: N/A;  . LEFT HEART CATHETERIZATION WITH CORONARY ANGIOGRAM N/A 07/20/2012   Procedure: LEFT HEART CATHETERIZATION WITH CORONARY ANGIOGRAM;  Surgeon: [name], MD;  Location: [facility] CATH LAB;  Service: Cardiovascular;  Laterality: N/A;  . LEFT HEART CATHETERIZATION WITH CORONARY ANGIOGRAM N/A 11/20/2014   Procedure: LEFT HEART CATHETERIZATION WITH CORONARY ANGIOGRAM;  Surgeon: [name], MD;  Location: [facility] CATH LAB;  Service: Cardiovascular;  Laterality: N/A;  . LEFT HEART CATHETERIZATION WITH CORONARY ANGIOGRAM N/A 11/26/2014   Procedure: LEFT HEART CATHETERIZATION WITH CORONARY ANGIOGRAM;  Surgeon: [name], MD;  Location: [facility] CATH LAB;  Service:  Cardiovascular;  Laterality: N/A;  . NEUROPLASTY / TRANSPOSITION ULNAR NERVE AT ELBOW Right ~ 2012  . PERCUTANEOUS CORONARY ROTOBLATOR INTERVENTION (PCI-R)  11/20/2014   Procedure: PERCUTANEOUS CORONARY ROTOBLATOR INTERVENTION (PCI-R);  Surgeon: [name], MD;  Location: [facility] CATH LAB;  Service: Cardiovascular;;  . SHOULDER ARTHROSCOPY Left ~ 2011  . TEE WITHOUT CARDIOVERSION N/A 01/09/2016   Procedure: TRANSESOPHAGEAL ECHOCARDIOGRAM (TEE);  Surgeon: Rexene Alberts, MD;  Location: [facility];  Service: Open Heart Surgery;  Laterality: N/A;       History of present illness and  Hospital Course:     Kindly see H&P for history of present illness and admission details, please review complete Labs, Consult reports and Test reports for all details in brief  HPI  from the history and physical done on the day of admission 11/20/2018  HPI: [name] [name] is a 63 y.o. male with medical history significant for CAD s/p PCI and [name], [name] [name], [name] [name], [name] [name], [name] [name], and OSA (not using CPAP) who presents to the ED with chest pain.  Patient reports returning to work at [employer] which has involved heavy lifting.  He says since then he has noticed about 2 weeks of exertional left-sided chest pain described as a pressure-like sensation that radiates to his left shoulder and left neck but not his jaw or back.  During these episodes he has associated dyspnea on exertion, lightheadedness without syncope, and diaphoresis.  He has noted occasional palpitations.  He says his symptoms resolve with rest.  He denies any symptom onset while at rest.  He denies any associated nausea, vomiting, abdominal pain.  His family visited today convinced him to come to the ED for further evaluation once finding out about the symptoms he has been describing.  ED Course:  Initial vitals showed BP 147/68, pulse 64, RR 17, temp 98.3 Fahrenheit, SPO2 91-96% on room air.  Labs are notable for troponin I <0.03, creatinine 1.28 (0.86 on 07/10/2018),  serum glucose 359, WBC 2.6 (chronic leukopenia going back to 2013), hemoglobin 14.2, platelets 88 (chronic thrombocytopenia with baseline ~90-110).  EKG showed sinus rhythm, rate 66 bpm, no acute ischemic changes.  Portable chest x-ray showed prior [name] changes without focal consolidation, edema, or effusion.  Patient was given aspirin 324 mg and sublingual nitroglycerin.  The ED physician, Dr. Thurnell Garbe, discussed the case with on-call cardiology Dr. Haroldine Laws who recommended starting IV heparin and continued management at [facility] for cardiology evaluation in a.m with potential transfer to [facility] for  cath or transfer sooner if needed.   [name] [name], [name] [name], [name] [name], [name] [name], [name] [name],[name] (not using CPAP)who presents to the ED with chest pain.  He was evaluated by cardiology, felt to require repeat cardiac catheterization, but was also found to be leukopenic and thrombocytopenic. Cardiology and [facility] felt a left and right heart catheterization could be considered to investigate his symptoms, however his hematologic abnormalities need to be better understood first as it relates to potential for using dual antiplatelet therapy were he to undergo DES intervention., recommended transfer to [facility] where hematology/oncology was consulted, felt abnormalities were due to fatty liver disease and cleared the patient to have anticoagulation/DAPT as long as platelets remained above 50k. Cardiac catheterization 1/14 showed relatively stable severe 3 vessel CAD with no intervention performed, medical management was recommended.    Exertional chest pain in patient with CAD s/p [name] 2017 -  Cardiac catheterization 1/14 showed stable severe CAD with patent grafts, normal LVEDP.  - Continue medical management per cardiology.  She is already on statin, aspirin, beta-blockers, Zetia  Leukopenia and thrombocytopenia: Suspected by  hematology to be due to sequestration ultimately related to FLD. Not felt to be primary marrow disorder without anemia. -Continue following with hematology as an outpatient, will arrange with follow-up in [facility] - per hematology to continue with antiplatelet therapy as long platelet count more than 50,000  AKI: Cr elevated to 1.28 on admission, since normalized.  -Noted to hold metformin for 2 days post discharge as received IV contrast yesterday  [name] [name]:  -Discharge, to hold metformin for next 2 days  HF with pEF -Evidence of elevated left ventricular filling pressure on cardiac cath, started on low-dose Lasix 20 mg oral on discharge  Obesity: - Weight loss recommended.  NAFLD:  - Hep C Ab pending at time of discharge, please follow  [name] [name]:  - Continue home medications  Hyperlipidemia:  - Continue statin  OSA:  - CPAP qHS  Asthma: No exacerbation - Monitor, can use prn nebs.   Discharge Condition:  Stable   Follow UP  Follow-up Information    Terrial Rhodes, MD Follow up in 1 week(s).   Contact information: [place] [address] [phone]        [name], MD .   Specialty:  Cardiology Contact information: [place] [address] [phone]             Discharge Instructions  and  Discharge Medications     Discharge Instructions    Discharge instructions   Complete by:  As directed    Follow with Primary MD Terrial Rhodes, MD in 7 days   Get CBC, CMP,  checked  by Primary MD next visit.    Activity: As tolerated with Full fall precautions use walker/cane & assistance as needed   Disposition Home    Diet: Heart Healthy , carb modified, with feeding assistance and aspiration precautions.  For Heart failure patients - Check your Weight same time everyday, if you gain over 2 pounds, or you develop in leg swelling, experience more shortness of breath or chest pain, call your  Primary MD immediately. Follow Cardiac Low Salt Diet and 1.5 lit/day fluid restriction.   On your next visit with your primary care physician please Get Medicines reviewed and adjusted.   Please request your Prim.MD to go over all Hospital Tests and Procedure/Radiological results  at the follow up, please get all Hospital records sent to your Prim MD by signing hospital release before you go home.   If you experience worsening of your admission symptoms, develop shortness of breath, life threatening emergency, suicidal or homicidal thoughts you must seek medical attention immediately by calling 911 or calling your MD immediately  if symptoms less severe.  You Must read complete instructions/literature along with all the possible adverse reactions/side effects for all the Medicines you take and that have been prescribed to you. Take any new Medicines after you have completely understood and accpet all the possible adverse reactions/side effects.   Do not drive, operating heavy machinery, perform activities at heights, swimming or participation in water activities or provide baby sitting services if your were admitted for syncope or siezures until you have seen by Primary MD or a Neurologist and advised to do so again.  Do not drive when taking Pain medications.    Do not take more than prescribed Pain, Sleep and Anxiety Medications  Special Instructions: If you have smoked or chewed Tobacco  in the last 2 yrs please stop smoking, stop any regular Alcohol  and or any Recreational drug use.  Wear Seat belts while driving.   Please note  You were cared for by a hospitalist during your hospital stay. If you have any questions about your discharge medications or the care you received while you were in the hospital after you are discharged, you can call the unit and asked to speak with the hospitalist on call if the hospitalist that took care of you is not available. Once you are discharged, your  primary care physician will handle any further medical issues. Please note that NO REFILLS for any discharge medications will be authorized once you are discharged, as it is imperative that you return to your primary care physician (or establish a relationship with a primary care physician if you do not have one) for your aftercare needs so that they can reassess your need for medications and monitor your lab values.   Increase activity slowly   Complete by:  As directed      Allergies as of 11/23/2018      Reactions   Divalproex Sodium Other (See Comments)   Causes anger   Imdur [isosorbide Dinitrate] Other (See Comments)   Severe headache (trying lower dose 07/05/18)   Lipitor [atorvastatin Calcium]    cramps   Statins Other (See Comments)   Muscle aches and cramps   Tramadol Other (See Comments)   Chest pain    Tricor [fenofibrate] Other (See Comments)   Leg cramps   Valproic Acid Other (See Comments)   "anger"      Medication List    STOP taking these medications   acetaminophen 500 MG tablet Commonly known as:  TYLENOL     TAKE these medications   albuterol 108 (90 Base) MCG/ACT inhaler Commonly known as:  PROVENTIL HFA;VENTOLIN HFA Inhale 2 puffs into the lungs every 6 (six) hours as needed for wheezing or shortness of breath.   amLODipine 5 MG tablet Commonly known as:  NORVASC Take 5 mg by mouth at bedtime.   aspirin EC 81 MG tablet Take 81 mg by mouth at bedtime.   cetirizine 10 MG tablet Commonly known as:  ZYRTEC Take 10 mg by mouth at bedtime.   citalopram 20 MG tablet Commonly known as:  CELEXA Take 20 mg by mouth at bedtime.   ezetimibe 10 MG tablet Commonly known  as:  ZETIA Take 10 mg by mouth at bedtime.   fluticasone 50 MCG/ACT nasal spray Commonly known as:  FLONASE Place 1 spray into the nose 2 (two) times daily.   Fluticasone-Salmeterol 250-50 MCG/DOSE Aepb Commonly known as:  ADVAIR Inhale 1 puff into the lungs every 12 (twelve) hours.     gabapentin 300 MG capsule Commonly known as:  NEURONTIN Take 300 mg by mouth 3 (three) times daily.   isosorbide mononitrate 30 MG 24 hr tablet Commonly known as:  IMDUR Take 30 mg by mouth at bedtime.   Melatonin 5 MG Tabs Take 10 mg by mouth at bedtime.   metFORMIN 1000 MG tablet Commonly known as:  GLUCOPHAGE Take 1 tablet (1,000 mg total) by mouth 2 (two) times daily. For the next 2 days as you did receive IV contrast, then resume on 11/26/2018 What changed:  additional instructions   metoprolol tartrate 50 MG tablet Commonly known as:  LOPRESSOR Take 50 mg by mouth at bedtime.   nitroGLYCERIN 0.4 MG SL tablet Commonly known as:  NITROSTAT Place 1 tablet (0.4 mg total) under the tongue every 5 (five) minutes as needed for chest pain.   NOVOLOG FLEXPEN 100 UNIT/ML FlexPen Generic drug:  insulin aspart Inject 2-30 Units into the skin 3 (three) times daily as needed for high blood sugar (CBG >150). Per sliding scale written down at home   omeprazole 20 MG capsule Commonly known as:  PRILOSEC Take 20 mg by mouth 2 (two) times daily before a meal.   pravastatin 20 MG tablet Commonly known as:  PRAVACHOL Take 1 tablet (20 mg total) by mouth daily.   topiramate 25 MG capsule Commonly known as:  TOPAMAX Take 25 mg by mouth 2 (two) times daily.   TRESIBA FLEXTOUCH 200 UNIT/ML Sopn Generic drug:  Insulin Degludec Inject 60-70 Units into the skin See admin instructions. Inject 70 units subcutaneously every morning and 60 units at bedtime   TRESIBA FLEXTOUCH 100 UNIT/ML Sopn FlexTouch Pen Generic drug:  insulin degludec INJECT 60 UNITS INTO THE SKIN EVERY MORNING AND INJECT 70 UNITS INTO THE SKIN AT BEDTIME         Diet and Activity recommendation: See Discharge Instructions above   Consults obtained -  Cardiology Hematology   Major procedures and Radiology Reports - PLEASE review detailed and final reports for all details, in brief -    US Abdomen  Complete  Result Date: 11/22/2018 CLINICAL DATA:  Pancytopenia. Evaluate for cirrhosis and splenomegaly EXAM: ABDOMEN ULTRASOUND COMPLETE COMPARISON:  12/29/2012 FINDINGS: Gallbladder: No gallstones or wall thickening visualized. No sonographic Murphy sign noted by sonographer. Common bile duct: Diameter: Normal caliber, 5 mm Liver: Increased echotexture compatible with fatty infiltration. No focal abnormality or biliary ductal dilatation. Portal vein is patent on color Doppler imaging with normal direction of blood flow towards the liver. IVC: No abnormality visualized. Pancreas: Not well visualized due to overlying bowel gas. Spleen: Mild splenomegaly. Craniocaudal length is upper limits of normal at 12.3 cm, but elevated volume at 517 mL. No focal abnormality. Right Kidney: Length: 11.7 cm. Echogenicity within normal limits. No mass or hydronephrosis visualized. Left Kidney: Length: 11.9 cm. Echogenicity within normal limits. No mass or hydronephrosis visualized. Abdominal aorta: No aneurysm visualized. Other findings: None. IMPRESSION: Fatty infiltration of the liver. Borderline splenomegaly. Electronically Signed   By: Rolm Baptise M.D.   On: 11/22/2018 09:08   Dg Chest Port 1 View  Result Date: 11/20/2018 CLINICAL DATA:  Chest pain EXAM: PORTABLE  CHEST 1 VIEW COMPARISON:  10/12/2017 FINDINGS: 1226 hours. The lungs are clear without focal pneumonia, edema, pneumothorax or pleural effusion. The cardiopericardial silhouette is within normal limits for size. Status post [name]. The visualized bony structures of the thorax are intact. Telemetry leads overlie the chest. IMPRESSION: No active disease. Electronically Signed   By: Misty Stanley M.D.   On: 11/20/2018 15:58    Micro Results     No results found for this or any previous visit (from the past 240 hour(s)).     Today   Subjective:   [name] [name] today has no headache,no chest abdominal pain,no new weakness tingling or numbness, feels  much better wants to go home today.   Objective:   Blood pressure 129/65, pulse (!) 57, temperature 98.1 F (36.7 C), temperature source Oral, resp. rate 18, height 5' 9"  (1.753 m), weight 107.7 kg, SpO2 96 %.   Intake/Output Summary (Last 24 hours) at 11/23/2018 1031 Last data filed at 11/23/2018 0839 Gross per 24 hour  Intake 1485.33 ml  Output 1400 ml  Net 85.33 ml    Exam Awake Alert, Oriented x 3, No new F.N deficits, Normal affect Symmetrical Chest wall movement, Good air movement bilaterally, CTAB RRR,No Gallops,Rubs or new Murmurs, No Parasternal Heave +ve B.Sounds, Abd Soft, Non tender, No organomegaly appriciated, No rebound -guarding or rigidity. No Cyanosis, Clubbing or edema, No new Rash or bruise, with capillary refills and pulses and no evidence of bleed at them left wrist/hand  Data Review   CBC w Diff:  Lab Results  Component Value Date   WBC 2.7 (L) 11/23/2018   HGB 13.5 11/23/2018   HCT 39.0 11/23/2018   PLT 73 (L) 11/23/2018   LYMPHOPCT 35 11/22/2018   MONOPCT 9 11/22/2018   EOSPCT 2 11/22/2018   BASOPCT 1 11/22/2018    CMP:  Lab Results  Component Value Date   NA 141 11/23/2018   K 3.9 11/23/2018   CL 107 11/23/2018   CO2 25 11/23/2018   BUN 12 11/23/2018   CREATININE 0.97 11/23/2018   PROT 6.2 (L) 11/21/2018   ALBUMIN 3.2 (L) 11/21/2018   BILITOT 0.5 11/21/2018   ALKPHOS 38 11/21/2018   AST 32 11/21/2018   ALT 33 11/21/2018  .   Total Time in preparing paper work, data evaluation and todays exam - 58 minutes  Phillips Climes M.D on 11/23/2018 at 10:31 AM  [facility]   Office  [phone]

## 2018-11-23 NOTE — Progress Notes (Signed)
D/c instructions given to patient and his wife at bedside. IV removed and AVS given to patient.

## 2018-11-24 ENCOUNTER — Emergency Department (HOSPITAL_COMMUNITY)
Admission: EM | Admit: 2018-11-24 | Discharge: 2018-11-25 | Disposition: A | Discharge status: Home / Self Care | Payer: Medicare Other | Source: Home / Self Care | Attending: Emergency Medicine | Admitting: Emergency Medicine

## 2018-11-24 ENCOUNTER — Emergency Department (HOSPITAL_COMMUNITY): Payer: Medicare Other

## 2018-11-24 ENCOUNTER — Encounter (HOSPITAL_COMMUNITY): Payer: Self-pay

## 2018-11-24 DIAGNOSIS — Z955 Presence of coronary angioplasty implant and graft: Secondary | ICD-10-CM | POA: Insufficient documentation

## 2018-11-24 DIAGNOSIS — R1011 Right upper quadrant pain: Secondary | ICD-10-CM | POA: Diagnosis not present

## 2018-11-24 DIAGNOSIS — I110 Hypertensive heart disease with heart failure: Secondary | ICD-10-CM | POA: Insufficient documentation

## 2018-11-24 DIAGNOSIS — I509 Heart failure, unspecified: Secondary | ICD-10-CM

## 2018-11-24 DIAGNOSIS — Z8673 Personal history of transient ischemic attack (TIA), and cerebral infarction without residual deficits: Secondary | ICD-10-CM | POA: Insufficient documentation

## 2018-11-24 DIAGNOSIS — Z794 Long term (current) use of insulin: Secondary | ICD-10-CM

## 2018-11-24 DIAGNOSIS — Z7982 Long term (current) use of aspirin: Secondary | ICD-10-CM

## 2018-11-24 DIAGNOSIS — E119 Type 2 diabetes mellitus without complications: Secondary | ICD-10-CM

## 2018-11-24 DIAGNOSIS — I2510 Atherosclerotic heart disease of native coronary artery without angina pectoris: Secondary | ICD-10-CM | POA: Insufficient documentation

## 2018-11-24 DIAGNOSIS — Z79899 Other long term (current) drug therapy: Secondary | ICD-10-CM | POA: Insufficient documentation

## 2018-11-24 DIAGNOSIS — K8000 Calculus of gallbladder with acute cholecystitis without obstruction: Secondary | ICD-10-CM | POA: Diagnosis not present

## 2018-11-24 LAB — CBC
HCT: 48.9 % (ref 39.0–52.0)
Hemoglobin: 16.4 g/dL (ref 13.0–17.0)
MCH: 30.4 pg (ref 26.0–34.0)
MCHC: 33.5 g/dL (ref 30.0–36.0)
MCV: 90.7 fL (ref 80.0–100.0)
Platelets: 101 10*3/uL — ABNORMAL LOW (ref 150–400)
RBC: 5.39 MIL/uL (ref 4.22–5.81)
RDW: 13.4 % (ref 11.5–15.5)
WBC: 10.3 10*3/uL (ref 4.0–10.5)
nRBC: 0 % (ref 0.0–0.2)

## 2018-11-24 LAB — COMPREHENSIVE METABOLIC PANEL
ALT: 33 U/L (ref 0–44)
AST: 40 U/L (ref 15–41)
Albumin: 3.8 g/dL (ref 3.5–5.0)
Alkaline Phosphatase: 49 U/L (ref 38–126)
Anion gap: 14 (ref 5–15)
BUN: 15 mg/dL (ref 6–20)
CO2: 22 mmol/L (ref 22–32)
Calcium: 9.3 mg/dL (ref 8.9–10.3)
Chloride: 100 mmol/L (ref 98–111)
Creatinine, Ser: 1.32 mg/dL — ABNORMAL HIGH (ref 0.61–1.24)
GFR calc Af Amer: >60 mL/min (ref >60)
GFR calc non Af Amer: 58 mL/min — ABNORMAL LOW (ref >60)
GLUCOSE: 230 mg/dL — AB (ref 70–99)
Potassium: 4.4 mmol/L (ref 3.5–5.1)
Sodium: 136 mmol/L (ref 135–145)
Total Bilirubin: 1.3 mg/dL — ABNORMAL HIGH (ref 0.3–1.2)
Total Protein: 7.9 g/dL (ref 6.5–8.1)

## 2018-11-24 LAB — I-STAT TROPONIN, ED: Troponin i, poc: 0.02 ng/mL (ref 0.00–0.08)

## 2018-11-24 LAB — HCV COMMENT:

## 2018-11-24 LAB — LIPASE, BLOOD: Lipase: 26 U/L (ref 11–51)

## 2018-11-24 LAB — HEPATITIS C ANTIBODY (REFLEX): HCV Ab: <0.1 s/co ratio (ref 0.0–0.9)

## 2018-11-24 MED ORDER — SODIUM CHLORIDE 0.9 % IV BOLUS
1000.0000 mL | Freq: Once | INTRAVENOUS | Status: AC
  Administered 2018-11-24: 1000 mL via INTRAVENOUS

## 2018-11-24 MED ORDER — IOPAMIDOL (ISOVUE-370) INJECTION 76%
100.0000 mL | Freq: Once | INTRAVENOUS | Status: AC | PRN
  Administered 2018-11-24: 55 mL via INTRAVENOUS

## 2018-11-24 MED ORDER — ONDANSETRON HCL 4 MG/2ML IJ SOLN
4.0000 mg | Freq: Once | INTRAMUSCULAR | Status: AC
  Administered 2018-11-24: 4 mg via INTRAVENOUS

## 2018-11-24 MED ORDER — ONDANSETRON 4 MG PO TBDP: 4.0000 mg | ORAL_TABLET | Freq: Three times a day (TID) | ORAL | 0 refills | Status: DC | PRN

## 2018-11-24 MED ORDER — HYDROMORPHONE HCL 1 MG/ML IJ SOLN
1.0000 mg | Freq: Once | INTRAMUSCULAR | Status: AC
  Administered 2018-11-24: 1 mg via INTRAVENOUS
  Filled 2018-11-24: qty 1

## 2018-11-24 MED ORDER — ONDANSETRON HCL 4 MG/2ML IJ SOLN
4.0000 mg | Freq: Once | INTRAMUSCULAR | Status: DC
  Filled 2018-11-24: qty 2

## 2018-11-24 MED ORDER — HYDROMORPHONE HCL 1 MG/ML IJ SOLN
1.0000 mg | Freq: Once | INTRAMUSCULAR | Status: AC
  Administered 2018-11-24: 1 mg via INTRAVENOUS

## 2018-11-24 MED ORDER — IOPAMIDOL (ISOVUE-370) INJECTION 76%
INTRAVENOUS | Status: AC
  Filled 2018-11-24: qty 100

## 2018-11-24 MED ORDER — SODIUM CHLORIDE 0.9% FLUSH: 3.0000 mL | Freq: Once | INTRAVENOUS | Status: DC

## 2018-11-24 MED ORDER — HYDROMORPHONE HCL 1 MG/ML IJ SOLN
1.0000 mg | Freq: Once | INTRAMUSCULAR | Status: DC
  Filled 2018-11-24: qty 1

## 2018-11-24 MED ORDER — ONDANSETRON HCL 4 MG/2ML IJ SOLN
4.0000 mg | Freq: Once | INTRAMUSCULAR | Status: AC
  Administered 2018-11-24: 4 mg via INTRAVENOUS
  Filled 2018-11-24: qty 2

## 2018-11-24 MED ORDER — OXYCODONE HCL 5 MG PO TABS: 5.0000 mg | ORAL_TABLET | ORAL | 0 refills | Status: DC | PRN

## 2018-11-24 NOTE — ED Notes (Signed)
I went to update pt vitals pt family member stated he is in a lot of pian and he cant stay out here he need to lay down and laying down on the chair hurts too much. I stated that I will let the nurse. Pt family stated that "he is going to fall out on the floor." pt family member also stated "yall dont care " I stated that we do care then pt family stated " NO YALL DON'T CARE" I stated okay and thanked that pt for letting me get his vitals. When  I went to get another pt vitals near by pt family member was on the phone talking loudly stating. " yall lucky yall are not here because I will be going to jail to night. The way they are treating (pt name). Then proceeding to say "no if you worked here you would be raising hell back there for not taking care of him. "

## 2018-11-24 NOTE — ED Notes (Signed)
Patient transported to CT 

## 2018-11-24 NOTE — Discharge Instructions (Signed)
Take Norco as needed as prescribed for pain. Take Zofran as needed as prescribed for nausea and vomiting. Follow up with GI- call tomorrow to schedule an appointment. Return to ER for worsening pain, fevers, vomiting, other concerning symptoms.

## 2018-11-24 NOTE — ED Provider Notes (Signed)
[facility] EMERGENCY DEPARTMENT Provider Note   CSN: [id number] Arrival date & time: 11/24/18  1301     History   Chief Complaint Chief Complaint  Patient presents with  . Abdominal Pain    HPI [name] [name] is a 63 y.o. male.  63 year old male presents with complaint of right upper abdominal pain and right-sided neck pain.  Patient was recently admitted to the hospital for chest pain work-up with cardiac cath, discharged home yesterday.  Patient went home and ate a scoop of peanut butter and then developed right upper quadrant abdominal pain with nausea.  Patient returned to [facility] ER where he had a CT scan and lab work, given pain medication and was discharged home.  Patient states that he continued to have pain all night.  Pain is constant, sharp and aching in nature, worse with taking deep breaths, nothing makes his pain better.  Patient denies changes in bowel or bladder habits, fevers, chills, vomiting.  Denies prior abdominal surgeries, no known problems with his gallbladder. No other complaints or concerns.      Past Medical History:  Diagnosis Date  . Anxiety   . Arthritis   . Asthma   . Cataract    Right eye  . CHF (congestive heart failure) ([place])   . Chronic lower back pain   . Coronary atherosclerosis of native coronary artery    a. LAD & OM stenting;  b. 2007 Cath: nonobs dzs, patent stents;  c. 07/2012 neg MV, EF 61%.  d. 11/20/14: [place] s/p  PCI w/ DES to pLAD and DES to OM1   . Essential hypertension, benign   . GERD (gastroesophageal reflux disease)   . Hearing loss of left ear   . History of gout   . History of hiatal hernia   . Hypercholesteremia   . Lumbar herniated disc   . MI, old   . Migraine   . OSA on CPAP   . 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 Thrombocytopenia ([place])   . TIA (transient ischemic attack) ~ 2010  . Type 2 diabetes mellitus [facility])     Patient  Active Problem List   Diagnosis Date Noted  . NASH (nonalcoholic steatohepatitis)   . Pancytopenia, acquired ([place])   . Acute kidney injury ([place]) 11/20/2018  . Aphasia 07/09/2018  . Cerebral thrombosis with cerebral infarction 07/09/2018  . Chest pain 08/10/2017  . 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 Coronary artery disease involving native coronary artery with unstable angina pectoris ([place])   . CAD -S/P PCI LAD OM/LAD '13 and 2016 12/31/2015  . Hyperlipidemia   . Chest pain with high risk of acute coronary syndrome 12/23/2015  . Precordial chest pain 12/03/2014  . TIA (transient ischemic attack)   . CAD- diffuse LAD disease at cath 12/30/15   . Thrombocytopenia ([place])   . Accelerating angina ([place]) 12/03/2013  . Type 2 diabetes mellitus ([place])   . Anxiety   . PVC's (premature ventricular contractions) 01/06/2013  . Obstructive sleep apnea-declines C-pap 07/04/2012  . Mixed hyperlipidemia 07/30/2009  . Essential hypertension, benign 07/30/2009    Past Surgical History:  Procedure Laterality Date  . ANTERIOR CERVICAL DECOMP/DISCECTOMY FUSION  1998   "C3-4"  . CARDIAC CATHETERIZATION  "several"  . CARDIAC CATHETERIZATION N/A 12/30/2015   Procedure: Left Heart Cath and Coronary Angiography;  Surgeon: [name], MD;  Location: [facility] CV LAB;  Service: Cardiovascular;  Laterality: N/A;  . CARPAL TUNNEL RELEASE Bilateral 2005  . COLONOSCOPY    . CORONARY ANGIOPLASTY    . CORONARY ANGIOPLASTY WITH STENT PLACEMENT  2002; 2003; 11/20/2014   "I have 4 stents after today" (11/20/2014)  . CORONARY ARTERY BYPASS GRAFT N/A 01/09/2016   Procedure: CORONARY ARTERY BYPASS GRAFTING (CABG) X 3 UTILIZING RIGHT AND LEFT INTERNAL MAMMARY ARTERY AND ENDOSCOPICALLY HARVESTED SAPHENEOUS VEIN.;  Surgeon: Rexene Alberts, MD;  Location: [facility];  Service: Open Heart Surgery;  Laterality: N/A;  . ESOPHAGOGASTRODUODENOSCOPY    . KNEE SURGERY Left 02/2012   "scraped; open"  . LEFT HEART CATH AND CORS/GRAFTS  ANGIOGRAPHY N/A 08/09/2017   Procedure: LEFT HEART CATH AND CORS/GRAFTS ANGIOGRAPHY;  Surgeon: [name], MD;  Location: [facility] CV LAB;  Service: Cardiovascular;  Laterality: N/A;  . LEFT HEART CATH AND CORS/GRAFTS ANGIOGRAPHY N/A 11/22/2018   Procedure: LEFT HEART CATH AND CORS/GRAFTS ANGIOGRAPHY;  Surgeon: [name], [name], MD;  Location: [facility] CV LAB;  Service: Cardiovascular;  Laterality: N/A;  . LEFT HEART CATHETERIZATION WITH CORONARY ANGIOGRAM N/A 07/20/2012   Procedure: LEFT HEART CATHETERIZATION WITH CORONARY ANGIOGRAM;  Surgeon: [name], MD;  Location: [facility] CATH LAB;  Service: Cardiovascular;  Laterality: N/A;  . LEFT HEART CATHETERIZATION WITH CORONARY ANGIOGRAM N/A 11/20/2014   Procedure: LEFT HEART CATHETERIZATION WITH CORONARY ANGIOGRAM;  Surgeon: [name], MD;  Location: [facility] CATH LAB;  Service: Cardiovascular;  Laterality: N/A;  . LEFT HEART CATHETERIZATION WITH CORONARY ANGIOGRAM N/A 11/26/2014   Procedure: LEFT HEART CATHETERIZATION WITH CORONARY ANGIOGRAM;  Surgeon: [name], MD;  Location[facility] C[facility] LAB;  Service: Cardiovascular;  Laterality: N/A;  . NEUROPLASTY / TRANSPOSITION ULNAR NERVE AT ELBOW Right ~ 2012  . PERCUTANEOUS CORONARY ROTOBLATOR INTERVENTION (PCI-R)  11/20/2014   Procedure: PERCUTANEOUS CORONARY ROTOBLATOR INTERVENTION (PCI-R);  Surgeon: [name], MD;  Location: [facility] CATH LAB;  Service: Cardiovascular;;  . SHOULDER ARTHROSCOPY Left ~ 2011  . TEE WITHOUT CARDIOVERSION N/A 01/09/2016   Procedure: TRANSESOPHAGEAL ECHOCARDIOGRAM (TEE);  Surgeon: Rexene Alberts, MD;  Location: [facility];  Service: Open Heart Surgery;  Laterality: N/A;        Home Medications    Prior to Admission medications   Medication Sig Start Date End Date Taking? Authorizing Provider  albuterol (PROVENTIL HFA;VENTOLIN HFA) 108 (90 BASE) MCG/ACT inhaler Inhale 2 puffs into the lungs every 6 (six) hours as needed for wheezing or shortness of breath.   Yes  [provider]  amLODipine (NORVASC) 5 MG tablet Take 5 mg by mouth at bedtime. 06/16/18  Yes [provider]  aspirin EC 81 MG tablet Take 81 mg by mouth at bedtime.    Yes [provider]  cetirizine (ZYRTEC) 10 MG tablet Take 10 mg by mouth at bedtime.   Yes [provider]  citalopram (CELEXA) 20 MG tablet Take 20 mg by mouth at bedtime.    Yes [provider]  ezetimibe (ZETIA) 10 MG tablet Take 10 mg by mouth at bedtime. 07/05/18  Yes [provider]  fluticasone (FLONASE) 50 MCG/ACT nasal spray Place 1 spray into the nose 2 (two) times daily.    Yes [provider]  Fluticasone-Salmeterol (ADVAIR) 250-50 MCG/DOSE AEPB Inhale 1 puff into the lungs every 12 (twelve) hours.   Yes [provider]  furosemide (LASIX) 20 MG tablet Take 1 tablet (20 mg total) by mouth daily. 11/23/18  Yes Elgergawy, Emeline Gins  S, MD  gabapentin (NEURONTIN) 300 MG capsule Take 300 mg by mouth 3 (three) times daily. 07/09/17  Yes [provider]  insulin aspart (NOVOLOG FLEXPEN) 100 UNIT/ML FlexPen Inject 2-30 Units into the skin 3 (three) times daily as needed for high blood sugar (CBG >150). Per sliding scale written down at home    Yes [provider]  isosorbide mononitrate (IMDUR) 30 MG 24 hr tablet Take 30 mg by mouth at bedtime. 06/16/18  Yes [provider]  Melatonin 5 MG TABS Take 10 mg by mouth at bedtime.    Yes [provider]  metFORMIN (GLUCOPHAGE) 1000 MG tablet Take 1 tablet (1,000 mg total) by mouth 2 (two) times daily. For the next 2 days as you did receive IV contrast, then resume on 11/26/2018 11/23/18  Yes Elgergawy, Silver Huguenin, MD  metoprolol tartrate (LOPRESSOR) 50 MG tablet Take 50 mg by mouth at bedtime. 07/05/18  Yes [provider]  nitroGLYCERIN (NITROSTAT) 0.4 MG SL tablet Place 1 tablet (0.4 mg total) under the tongue every 5 (five) minutes as needed for chest pain. 08/13/17 11/24/18 Yes  [name], MD  omeprazole (PRILOSEC) 20 MG capsule Take 20 mg by mouth 2 (two) times daily before a meal.   Yes [provider]  pravastatin (PRAVACHOL) 20 MG tablet Take 1 tablet (20 mg total) by mouth daily. 09/13/18  Yes [name], MD  topiramate (TOPAMAX) 25 MG capsule Take 25 mg by mouth 2 (two) times daily.   Yes [provider]  TRESIBA FLEXTOUCH 100 UNIT/ML SOPN FlexTouch Pen Inject 60-70 Units into the skin See admin instructions. 60 units in the morning and 70 units at bedtime 08/11/18  Yes [provider]  ondansetron (ZOFRAN ODT) 4 MG disintegrating tablet Take 1 tablet (4 mg total) by mouth every 8 (eight) hours as needed for up to 12 days for nausea or vomiting. 11/24/18 12/06/18  Tacy Learn, PA-C  oxyCODONE (ROXICODONE) 5 MG immediate release tablet Take 1 tablet (5 mg total) by mouth every 4 (four) hours as needed for severe pain. 11/24/18   Tacy Learn, PA-C    Family History Family History  Problem Relation Age of Onset  . Coronary artery disease Father 97    Social History Social History   Tobacco Use  . Smoking status: Never Smoker  . Smokeless tobacco: Never Used  Substance Use Topics  . Alcohol use: No  . Drug use: No     Allergies   Divalproex sodium; Imdur [isosorbide dinitrate]; Lipitor [atorvastatin calcium]; Statins; Tramadol; Tricor [fenofibrate]; and Valproic acid   Review of Systems Review of Systems  Constitutional: Negative for chills and fever.  Respiratory: Negative for shortness of breath.   Cardiovascular: Negative for chest pain.  Gastrointestinal: Positive for abdominal pain and nausea. Negative for constipation, diarrhea and vomiting.  Genitourinary: Negative for decreased urine volume and difficulty urinating.  Musculoskeletal: Positive for neck pain.  Skin: Negative for rash and wound.  Allergic/Immunologic: Positive for immunocompromised state.  Neurological: Negative for headaches.    Hematological: Negative for adenopathy.  Psychiatric/Behavioral: Negative for confusion.  All other systems reviewed and are negative.    Physical Exam Updated Vital Signs BP 117/64   Pulse 92   Temp 99.2 F (37.3 C) (Oral)   Resp 16   SpO2 94%   Physical Exam Vitals signs and nursing note reviewed.  Constitutional:      General: He is not in acute distress.  Appearance: He is well-developed. He is not diaphoretic.  HENT:     Head: Normocephalic and atraumatic.  Cardiovascular:     Rate and Rhythm: Normal rate and regular rhythm.     Heart sounds: Normal heart sounds.  Pulmonary:     Effort: Pulmonary effort is normal.     Breath sounds: Normal breath sounds.  Abdominal:     General: Abdomen is protuberant.     Tenderness: There is abdominal tenderness in the right upper quadrant. There is guarding.  Skin:    General: Skin is warm and dry.     Findings: No rash.  Neurological:     Mental Status: He is alert and oriented to person, place, and time.  Psychiatric:        Behavior: Behavior normal.      ED Treatments / Results  Labs (all labs ordered are listed, but only abnormal results are displayed) Labs Reviewed  COMPREHENSIVE METABOLIC PANEL - Abnormal; Notable for the following components:      Result Value   Glucose, Bld 230 (*)    Creatinine, Ser 1.32 (*)    Total Bilirubin 1.3 (*)    GFR calc non Af Amer 58 (*)    All other components within normal limits  CBC - Abnormal; Notable for the following components:   Platelets 101 (*)    All other components within normal limits  LIPASE, BLOOD  I-STAT TROPONIN, ED    EKG EKG Interpretation  Date/Time:  Thursday November 24 2018 13:20:49 EST Ventricular Rate:  96 PR Interval:  124 QRS Duration: 88 QT Interval:  362 QTC Calculation: 457 R Axis:   65 Text Interpretation:  Normal sinus rhythm Possible Left atrial enlargement Anterior infarct , age undetermined Abnormal ECG Confirmed by Veryl Speak  [phone]) on 11/24/2018 1:58:19 PM   Radiology Ct Abdomen Pelvis Wo Contrast  Result Date: 11/23/2018 CLINICAL DATA:  63 year old male with epigastric pain. Recent heart catheterization yesterday. EXAM: CT ABDOMEN AND PELVIS WITHOUT CONTRAST TECHNIQUE: Multidetector CT imaging of the abdomen and pelvis was performed following the standard protocol without IV contrast. COMPARISON:  Abdominal ultrasound performed yesterday 11/22/2018 FINDINGS: Lower chest: The visualized cardiac structures are within normal limits for size. No pericardial effusion. Atherosclerotic vascular calcifications. Patient is status post median sternotomy. Unremarkable distal esophagus. The lung bases are clear. Hepatobiliary: Low attenuation of the hepatic parenchyma consistent with steatosis. Relative hypertrophy of the caudate and left hepatic lobe consistent with cirrhotic change. Gallbladder is unremarkable. No intra or extrahepatic biliary ductal dilatation. Pancreas: Unremarkable. No pancreatic ductal dilatation or surrounding inflammatory changes. Spleen: Normal in size without focal abnormality. Adrenals/Urinary Tract: Unremarkable adrenal glands. No evidence of hydronephrosis or nephrolithiasis. Unremarkable ureters and bladder. Stomach/Bowel: No evidence of obstruction or focal bowel wall thickening. Normal appendix in the right lower quadrant. The terminal ileum is unremarkable. Vascular/Lymphatic: Limited evaluation in the absence of intravenous contrast. Scattered atherosclerotic vascular calcifications throughout the abdominal aorta. No evidence of aneurysm. Reproductive: Prostate is unremarkable. Other: No abdominal wall hernia or abnormality. No abdominopelvic ascites. Musculoskeletal: No acute fracture or aggressive appearing lytic or blastic osseous lesion. IMPRESSION: 1. No acute abnormality within the abdomen or pelvis. 2. Hepatic steatosis with evidence of hepatic cirrhosis. 3.  Aortic Atherosclerosis (ICD10-170.0)  Electronically Signed   By: Jacqulynn Cadet M.D.   On: 11/23/2018 21:59   Ct Angio Chest Pe W/cm &/or Wo Cm  Result Date: 11/24/2018 CLINICAL DATA:  Shortness of breath. EXAM: CT ANGIOGRAPHY CHEST WITH CONTRAST TECHNIQUE:  Multidetector CT imaging of the chest was performed using the standard protocol during bolus administration of intravenous contrast. Multiplanar CT image reconstructions and MIPs were obtained to evaluate the vascular anatomy. CONTRAST:  19m ISOVUE-370 IOPAMIDOL (ISOVUE-370) INJECTION 76% COMPARISON:  08/13/2017 FINDINGS: Cardiovascular: The heart size is normal. No substantial pericardial effusion. Coronary artery calcification is evident. Status post CABG. No thoracic aortic aneurysm. Assessment of pulmonary arteries is limited by respiratory motion. Some segmental and subsegmental branches have linear low-density in the lumen (see central right lung on 57/5) but this is felt to be more likely related to artifact from the breathing motion and pulmonary embolus. Mediastinum/Nodes: No mediastinal lymphadenopathy. There is no hilar lymphadenopathy. The esophagus has normal imaging features. There is no axillary lymphadenopathy. Lungs/Pleura: No suspicious pulmonary nodule or mass. No focal airspace consolidation. Dependent atelectasis noted in the lungs bilaterally. No pleural effusion. Upper Abdomen: Gallbladder is distended with ill-defined gallbladder wall and some apparent pericholecystic edema.Subtle nodularity of liver contour with large lateral segment left liver and caudate lobe is suggestive of cirrhosis. Musculoskeletal: No worrisome lytic or sclerotic osseous abnormality. Review of the MIP images confirms the above findings. IMPRESSION: 1. No CT evidence for acute pulmonary embolus although assessment of segmental and subsegmental pulmonary arteries is limited by respiratory motion. 2. No focal airspace consolidation or pleural effusion. 3. Distended gallbladder with ill-defined  gallbladder wall and some apparent pericholecystic edema. Acute cholecystitis is a concern. Abdominal ultrasound may prove helpful to further evaluate. 4. Liver morphology suggestive of cirrhosis. Electronically Signed   By: EMisty StanleyM.D.   On: 11/24/2018 19:03   [place]oreaAbdomen Limited  Result Date: 11/24/2018 CLINICAL DATA:  RIGHT upper quadrant pain for 2 days with nausea; history hypertension, CHF, diabetes mellitus EXAM: ULTRASOUND ABDOMEN LIMITED RIGHT UPPER QUADRANT COMPARISON:  CT abdomen and pelvis 11/23/2018 FINDINGS: Gallbladder: Distended without stones or wall thickening. No pericholecystic fluid or sonographic Trudee Chirino sign. Common bile duct: Diameter: 3 mm diameter, normal Liver: Echogenic parenchyma, likely fatty infiltration though this can be seen with cirrhosis and certain infiltrative disorders. No focal hepatic mass or nodularity. Portal vein is patent on color Doppler imaging with normal direction of blood flow towards the liver. Trace perihepatic ascites. IMPRESSION: Question fatty infiltration of liver as above. Trace perihepatic ascites. Otherwise negative exam. Electronically Signed   By: MLavonia DanaM.D.   On: 11/24/2018 20:55    Procedures Procedures (including critical care time)  Medications Ordered in ED Medications  sodium chloride flush (NS) 0.9 % injection 3 mL (3 mLs Intravenous Not Given 11/24/18 1807)  iopamidol (ISOVUE-370) 76 % injection (has no administration in time range)  sodium chloride 0.9 % bolus 1,000 mL (0 mLs Intravenous Stopped 11/24/18 1902)  HYDROmorphone (DILAUDID) injection 1 mg (1 mg Intravenous Given 11/24/18 1803)  ondansetron (ZOFRAN) injection 4 mg (4 mg Intravenous Given 11/24/18 1801)  iopamidol (ISOVUE-370) 76 % injection 100 mL (55 mLs Intravenous Contrast Given 11/24/18 1831)  HYDROmorphone (DILAUDID) injection 1 mg (1 mg Intravenous Given 11/24/18 2226)  ondansetron (ZOFRAN) injection 4 mg (4 mg Intravenous Given 11/24/18 2226)      Initial Impression / Assessment and Plan / ED Course  I have reviewed the triage vital signs and the nursing notes.  Pertinent labs & imaging results that were available during my care of the patient were reviewed by me and considered in my medical decision making (see chart for details).  Clinical Course as of Nov 24 2330  Thu Nov 24, 2018  2325  63yo male presents with complaint of RUQ abdominal pain. Patient was recently dc from the hospital following CP work up including cardiac cath, found to have low WBC and was also seen by hematology, advised he had fatty liver, no cirrhosis, given diet instruction with plan to follow up with hematology. Patient was seen in the ER at [facility] yesterday for same, had CT abd/pelvis with contrast with no diagnosis made. Patient went home yesterday, continued to have pain all night last night, awoke today with ongoing or worse pain, called PCP was advised to come to [facility], ER. Pain is worse with deep inspiration, palpation RUQ. Discussed with Dr. Ashok Cordia, ER attending who has also seen the patient, recommends CTA r/o PE. CTA chest negative for PE however findings concerning for acute cholecystitis and recommended [place] gall bladder (previous US RUQ 2 days prior normal gall bladder). [place] today shows distended gall bladder without evidence of acute cholecystitis. Labs unremarkable with exception of mildly elevated total bili. Case reviewed wit Dr. Ashok Cordia, patient may be dc to follow up with GI for further evaluation. Patient was given additional does of diluadid (some relief after 1 dose but was wearing off), pain somewhat improved, tolerating PO fluids. Discussed plan of care with patient and wife, agreeable with plan for dc home with pain medication- given Oxycodone after review of database, avoiding tylenol for current liver work up. Also zofran for nausea/vomiting. Given strict return to ER precautions.  Given referral to local GI as well as GI in [place] closer to  patient's home.    [LM]    Clinical Course User Index [LM] Tacy Learn, PA-C   Final Clinical Impressions(s) / ED Diagnoses   Final diagnoses:  Right upper quadrant abdominal pain    ED Discharge Orders         Ordered    oxyCODONE (ROXICODONE) 5 MG immediate release tablet  Every 4 hours PRN     11/24/18 2324    ondansetron (ZOFRAN ODT) 4 MG disintegrating tablet  Every 8 hours PRN     11/24/18 2324           Tacy Learn, PA-C 11/24/18 2332    Lajean Saver, MD 12/01/18 1409

## 2018-11-24 NOTE — ED Triage Notes (Signed)
Pt presents for evaluation of epigastric and R sided abd pain with nausea. States seen at [facility] last night for same. Radiation to R neck.

## 2018-11-25 ENCOUNTER — Emergency Department (HOSPITAL_COMMUNITY): Payer: Medicare Other

## 2018-11-25 ENCOUNTER — Other Ambulatory Visit: Payer: Self-pay

## 2018-11-25 ENCOUNTER — Inpatient Hospital Stay (HOSPITAL_COMMUNITY)
Admission: EM | Admit: 2018-11-25 | Discharge: 2018-11-30 | DRG: 417 | Disposition: A | Discharge status: Home / Self Care | Payer: Medicare Other | Attending: Family Medicine | Admitting: Family Medicine

## 2018-11-25 ENCOUNTER — Encounter (HOSPITAL_COMMUNITY): Payer: Self-pay | Admitting: Emergency Medicine

## 2018-11-25 DIAGNOSIS — Z79899 Other long term (current) drug therapy: Secondary | ICD-10-CM

## 2018-11-25 DIAGNOSIS — Z955 Presence of coronary angioplasty implant and graft: Secondary | ICD-10-CM

## 2018-11-25 DIAGNOSIS — Z8249 Family history of ischemic heart disease and other diseases of the circulatory system: Secondary | ICD-10-CM

## 2018-11-25 DIAGNOSIS — D696 Thrombocytopenia, unspecified: Secondary | ICD-10-CM | POA: Diagnosis present

## 2018-11-25 DIAGNOSIS — H9192 Unspecified hearing loss, left ear: Secondary | ICD-10-CM | POA: Diagnosis present

## 2018-11-25 DIAGNOSIS — R0902 Hypoxemia: Secondary | ICD-10-CM

## 2018-11-25 DIAGNOSIS — Z7982 Long term (current) use of aspirin: Secondary | ICD-10-CM

## 2018-11-25 DIAGNOSIS — Z888 Allergy status to other drugs, medicaments and biological substances status: Secondary | ICD-10-CM

## 2018-11-25 DIAGNOSIS — R161 Splenomegaly, not elsewhere classified: Secondary | ICD-10-CM | POA: Diagnosis present

## 2018-11-25 DIAGNOSIS — Z8673 Personal history of transient ischemic attack (TIA), and cerebral infarction without residual deficits: Secondary | ICD-10-CM

## 2018-11-25 DIAGNOSIS — K8000 Calculus of gallbladder with acute cholecystitis without obstruction: Principal | ICD-10-CM

## 2018-11-25 DIAGNOSIS — E1165 Type 2 diabetes mellitus with hyperglycemia: Secondary | ICD-10-CM

## 2018-11-25 DIAGNOSIS — K7581 Nonalcoholic steatohepatitis (NASH): Secondary | ICD-10-CM | POA: Diagnosis present

## 2018-11-25 DIAGNOSIS — I110 Hypertensive heart disease with heart failure: Secondary | ICD-10-CM | POA: Diagnosis present

## 2018-11-25 DIAGNOSIS — I252 Old myocardial infarction: Secondary | ICD-10-CM

## 2018-11-25 DIAGNOSIS — I10 Essential (primary) hypertension: Secondary | ICD-10-CM | POA: Diagnosis present

## 2018-11-25 DIAGNOSIS — Z951 Presence of aortocoronary bypass graft: Secondary | ICD-10-CM

## 2018-11-25 DIAGNOSIS — R1011 Right upper quadrant pain: Secondary | ICD-10-CM

## 2018-11-25 DIAGNOSIS — I2510 Atherosclerotic heart disease of native coronary artery without angina pectoris: Secondary | ICD-10-CM | POA: Diagnosis present

## 2018-11-25 DIAGNOSIS — K7460 Unspecified cirrhosis of liver: Secondary | ICD-10-CM | POA: Diagnosis present

## 2018-11-25 DIAGNOSIS — Z7951 Long term (current) use of inhaled steroids: Secondary | ICD-10-CM

## 2018-11-25 DIAGNOSIS — E119 Type 2 diabetes mellitus without complications: Secondary | ICD-10-CM | POA: Diagnosis present

## 2018-11-25 DIAGNOSIS — K82A1 Gangrene of gallbladder in cholecystitis: Secondary | ICD-10-CM | POA: Diagnosis present

## 2018-11-25 DIAGNOSIS — D6959 Other secondary thrombocytopenia: Secondary | ICD-10-CM | POA: Diagnosis present

## 2018-11-25 DIAGNOSIS — N179 Acute kidney failure, unspecified: Secondary | ICD-10-CM | POA: Diagnosis not present

## 2018-11-25 DIAGNOSIS — G8929 Other chronic pain: Secondary | ICD-10-CM | POA: Diagnosis present

## 2018-11-25 DIAGNOSIS — E782 Mixed hyperlipidemia: Secondary | ICD-10-CM | POA: Diagnosis present

## 2018-11-25 DIAGNOSIS — Z794 Long term (current) use of insulin: Secondary | ICD-10-CM

## 2018-11-25 DIAGNOSIS — K760 Fatty (change of) liver, not elsewhere classified: Secondary | ICD-10-CM

## 2018-11-25 DIAGNOSIS — I5031 Acute diastolic (congestive) heart failure: Secondary | ICD-10-CM | POA: Diagnosis present

## 2018-11-25 DIAGNOSIS — E1169 Type 2 diabetes mellitus with other specified complication: Secondary | ICD-10-CM

## 2018-11-25 DIAGNOSIS — Z981 Arthrodesis status: Secondary | ICD-10-CM

## 2018-11-25 DIAGNOSIS — J45909 Unspecified asthma, uncomplicated: Secondary | ICD-10-CM | POA: Diagnosis present

## 2018-11-25 DIAGNOSIS — K821 Hydrops of gallbladder: Secondary | ICD-10-CM | POA: Diagnosis present

## 2018-11-25 DIAGNOSIS — K219 Gastro-esophageal reflux disease without esophagitis: Secondary | ICD-10-CM | POA: Diagnosis present

## 2018-11-25 DIAGNOSIS — Z79890 Hormone replacement therapy (postmenopausal): Secondary | ICD-10-CM

## 2018-11-25 DIAGNOSIS — J9601 Acute respiratory failure with hypoxia: Secondary | ICD-10-CM | POA: Diagnosis present

## 2018-11-25 DIAGNOSIS — M5126 Other intervertebral disc displacement, lumbar region: Secondary | ICD-10-CM | POA: Diagnosis present

## 2018-11-25 DIAGNOSIS — R509 Fever, unspecified: Secondary | ICD-10-CM | POA: Diagnosis present

## 2018-11-25 DIAGNOSIS — G4733 Obstructive sleep apnea (adult) (pediatric): Secondary | ICD-10-CM | POA: Diagnosis present

## 2018-11-25 LAB — URINALYSIS, ROUTINE W REFLEX MICROSCOPIC
BACTERIA UA: NONE SEEN
Bilirubin Urine: NEGATIVE
Glucose, UA: NEGATIVE mg/dL
Ketones, ur: NEGATIVE mg/dL
Leukocytes, UA: NEGATIVE
Nitrite: NEGATIVE
Protein, ur: 100 mg/dL — AB
Specific Gravity, Urine: 1.028 (ref 1.005–1.030)
pH: 5 (ref 5.0–8.0)

## 2018-11-25 LAB — COMPREHENSIVE METABOLIC PANEL
ALT: 33 U/L (ref 0–44)
AST: 30 U/L (ref 15–41)
Albumin: 3.8 g/dL (ref 3.5–5.0)
Alkaline Phosphatase: 44 U/L (ref 38–126)
Anion gap: 8 (ref 5–15)
BUN: 32 mg/dL — ABNORMAL HIGH (ref 6–20)
CO2: 24 mmol/L (ref 22–32)
Calcium: 8.6 mg/dL — ABNORMAL LOW (ref 8.9–10.3)
Chloride: 100 mmol/L (ref 98–111)
Creatinine, Ser: 1.41 mg/dL — ABNORMAL HIGH (ref 0.61–1.24)
GFR, EST NON AFRICAN AMERICAN: 54 mL/min — AB (ref >60)
Glucose, Bld: 162 mg/dL — ABNORMAL HIGH (ref 70–99)
Potassium: 3.6 mmol/L (ref 3.5–5.1)
Sodium: 132 mmol/L — ABNORMAL LOW (ref 135–145)
Total Bilirubin: 1.9 mg/dL — ABNORMAL HIGH (ref 0.3–1.2)
Total Protein: 8.4 g/dL — ABNORMAL HIGH (ref 6.5–8.1)

## 2018-11-25 LAB — CBC WITH DIFFERENTIAL/PLATELET
Abs Immature Granulocytes: 0.17 10*3/uL — ABNORMAL HIGH (ref 0.00–0.07)
Basophils Absolute: 0 10*3/uL (ref 0.0–0.1)
Basophils Relative: 0 %
Eosinophils Absolute: 0 10*3/uL (ref 0.0–0.5)
Eosinophils Relative: 0 %
HCT: 47.1 % (ref 39.0–52.0)
Hemoglobin: 15.4 g/dL (ref 13.0–17.0)
IMMATURE GRANULOCYTES: 2 %
Lymphocytes Relative: 13 %
Lymphs Abs: 1.1 10*3/uL (ref 0.7–4.0)
MCH: 30.5 pg (ref 26.0–34.0)
MCHC: 32.7 g/dL (ref 30.0–36.0)
MCV: 93.3 fL (ref 80.0–100.0)
Monocytes Absolute: 0.5 10*3/uL (ref 0.1–1.0)
Monocytes Relative: 6 %
NEUTROS PCT: 79 %
NRBC: 0 % (ref 0.0–0.2)
Neutro Abs: 6.6 10*3/uL (ref 1.7–7.7)
Platelets: 93 10*3/uL — ABNORMAL LOW (ref 150–400)
RBC: 5.05 MIL/uL (ref 4.22–5.81)
RDW: 14.2 % (ref 11.5–15.5)
WBC: 8.5 10*3/uL (ref 4.0–10.5)

## 2018-11-25 LAB — INFLUENZA PANEL BY PCR (TYPE A & B)
Influenza A By PCR: NEGATIVE
Influenza B By PCR: NEGATIVE

## 2018-11-25 LAB — LIPASE, BLOOD: Lipase: 22 U/L (ref 11–51)

## 2018-11-25 MED ORDER — ONDANSETRON HCL 4 MG/2ML IJ SOLN
4.0000 mg | Freq: Once | INTRAMUSCULAR | Status: AC
  Administered 2018-11-25: 4 mg via INTRAVENOUS
  Filled 2018-11-25: qty 2

## 2018-11-25 MED ORDER — HYDROMORPHONE HCL 1 MG/ML IJ SOLN
1.0000 mg | Freq: Once | INTRAMUSCULAR | Status: AC
  Administered 2018-11-25: 1 mg via INTRAVENOUS
  Filled 2018-11-25: qty 1

## 2018-11-25 MED ORDER — SODIUM CHLORIDE 0.9 % IV SOLN: INTRAVENOUS | Status: DC

## 2018-11-25 MED ORDER — SODIUM CHLORIDE 0.9 % IV BOLUS
1000.0000 mL | Freq: Once | INTRAVENOUS | Status: AC
  Administered 2018-11-25: 1000 mL via INTRAVENOUS

## 2018-11-25 NOTE — ED Notes (Signed)
Pt placed on O2 at 2L/min via [place]

## 2018-11-25 NOTE — ED Provider Notes (Addendum)
[facility] EMERGENCY DEPARTMENT Provider Note   CSN: [id number] Arrival date & time: 11/25/18  1914     History   Chief Complaint Chief Complaint  Patient presents with  . Abdominal Pain    HPI [name] [name] is a 63 y.o. male.  Patient with recent admission to the hospital on January 12 with discharged home on January 15.  Since that time patient's been seen in the emergency department January 15 and January 16 and then again today.  For persistent right upper quadrant abdominal pain.  Patient's had pretty extensive work-up for this to include CT abdomen and pelvis without any acute abnormalities.  Also had ultrasound done with some dilatation of the gallbladder but no gallstones.  Patient had CT angios chest without evidence of pulmonary embolus.  Patient presents back again today with persistent right upper quadrant abdominal pain not helped by pain medication.  Patient also with development of fever today was at primary care doctor's office earlier.  High temp was 102.  Temp here was 100.2.  In addition he had hypoxia.  Oxygen sats would go down to 86 to 88% on room air.  And patient did have some belly breathing.  No history of asthma or COPD.  Patient's had decreased appetite no nausea or vomiting or diarrhea.  No dysuria.  No unusual rash.  Patient has not been eating or drinking well.  Patient had onset of some abdominal discomfort more in the periumbilical area on Sunday.  Started with the right upper quadrant pain on Wednesday shortly after his discharge.  Evaluated in the emergency department on Wednesday had CT of chest and abdomen done without acute findings.  Was seen emergency department at [facility] yesterday had ultrasound and CT Angie of the chest done.  No evidence of any gallstones but the gallbladder was distended.  Patient also had negative troponin.  CT Angie of chest was negative for pulmonary embolism.     Past Medical History:  Diagnosis Date  . Anxiety   .  Arthritis   . Asthma   . Cataract    Right eye  . CHF (congestive heart failure) ([place])   . Chronic lower back pain   . Coronary atherosclerosis of native coronary artery    a. LAD & OM stenting;  b. 2007 Cath: nonobs dzs, patent stents;  c. 07/2012 neg MV, EF 61%.  d. 11/20/14: [place] s/p  PCI w/ DES to pLAD and DES to OM1   . Essential hypertension, benign   . GERD (gastroesophageal reflux disease)   . Hearing loss of left ear   . History of gout   . History of hiatal hernia   . Hypercholesteremia   . Lumbar herniated disc   . MI, old   . Migraine   . OSA on CPAP   . 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 Thrombocytopenia ([place])   . TIA (transient ischemic attack) ~ 2010  . Type 2 diabetes mellitus [facility])     Patient Active Problem List   Diagnosis Date Noted  . NASH (nonalcoholic steatohepatitis)   . Pancytopenia, acquired ([place])   . Acute kidney injury ([place]) 11/20/2018  . Aphasia 07/09/2018  . Cerebral thrombosis with cerebral infarction 07/09/2018  . Chest pain 08/10/2017  . 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 Coronary artery disease involving native coronary artery with unstable angina pectoris ([place])   .  CAD -S/P PCI LAD OM/LAD '13 and 2016 12/31/2015  . Hyperlipidemia   . Chest pain with high risk of acute coronary syndrome 12/23/2015  . Precordial chest pain 12/03/2014  . TIA (transient ischemic attack)   . CAD- diffuse LAD disease at cath 12/30/15   . Thrombocytopenia ([place])   . Accelerating angina ([place]) 12/03/2013  . Type 2 diabetes mellitus ([place])   . Anxiety   . PVC's (premature ventricular contractions) 01/06/2013  . Obstructive sleep apnea-declines C-pap 07/04/2012  . Mixed hyperlipidemia 07/30/2009  . Essential hypertension, benign 07/30/2009    Past Surgical History:  Procedure Laterality Date  . ANTERIOR CERVICAL DECOMP/DISCECTOMY FUSION  1998   "C3-4"  . CARDIAC CATHETERIZATION  "several"  . CARDIAC  CATHETERIZATION N/A 12/30/2015   Procedure: Left Heart Cath and Coronary Angiography;  Surgeon: [name], MD;  Location: [place] CV LAB;  Service: Cardiovascular;  Laterality: N/A;  . CARPAL TUNNEL RELEASE Bilateral 2005  . COLONOSCOPY    . CORONARY ANGIOPLASTY    . CORONARY ANGIOPLASTY WITH STENT PLACEMENT  2002; 2003; 11/20/2014   "I have 4 stents after today" (11/20/2014)  . CORONARY ARTERY BYPASS GRAFT N/A 01/09/2016   Procedure: CORONARY ARTERY BYPASS GRAFTING (CABG) X 3 UTILIZING RIGHT AND LEFT INTERNAL MAMMARY ARTERY AND ENDOSCOPICALLY HARVESTED SAPHENEOUS VEIN.;  Surgeon: Rexene Alberts, MD;  Location: [place];  Service: Open Heart Surgery;  Laterality: N/A;  . ESOPHAGOGASTRODUODENOSCOPY    . KNEE SURGERY Left 02/2012   "scraped; open"  . LEFT HEART CATH AND CORS/GRAFTS ANGIOGRAPHY N/A 08/09/2017   Procedure: LEFT HEART CATH AND CORS/GRAFTS ANGIOGRAPHY;  Surgeon: [name], MD;  Location: [place] CV LAB;  Service: Cardiovascular;  Laterality: N/A;  . LEFT HEART CATH AND CORS/GRAFTS ANGIOGRAPHY N/A 11/22/2018   Procedure: LEFT HEART CATH AND CORS/GRAFTS ANGIOGRAPHY;  Surgeon: [name], [name], MD;  Location: [place] CV LAB;  Service: Cardiovascular;  Laterality: N/A;  . LEFT HEART CATHETERIZATION WITH CORONARY ANGIOGRAM N/A 07/20/2012   Procedure: LEFT HEART CATHETERIZATION WITH CORONARY ANGIOGRAM;  Surgeon: [name], MD;  Location: [place] CATH LAB;  Service: Cardiovascular;  Laterality: N/A;  . LEFT HEART CATHETERIZATION WITH CORONARY ANGIOGRAM N/A 11/20/2014   Procedure: LEFT HEART CATHETERIZATION WITH CORONARY ANGIOGRAM;  Surgeon: [name], MD;  Location: [facility] CATH LAB;  Service: Cardiovascular;  Laterality: N/A;  . LEFT HEART CATHETERIZATION WITH CORONARY ANGIOGRAM N/A 11/26/2014   Procedure: LEFT HEART CATHETERIZATION WITH CORONARY ANGIOGRAM;  Surgeon: [name], MD;  Location: [facility] CATH LAB;  Service: Cardiovascular;  Laterality: N/A;  . NEUROPLASTY /  TRANSPOSITION ULNAR NERVE AT ELBOW Right ~ 2012  . PERCUTANEOUS CORONARY ROTOBLATOR INTERVENTION (PCI-R)  11/20/2014   Procedure: PERCUTANEOUS CORONARY ROTOBLATOR INTERVENTION (PCI-R);  Surgeon: [name], MD;  Location: [facility] CATH LAB;  Service: Cardiovascular;;  . SHOULDER ARTHROSCOPY Left ~ 2011  . TEE WITHOUT CARDIOVERSION N/A 01/09/2016   Procedure: TRANSESOPHAGEAL ECHOCARDIOGRAM (TEE);  Surgeon: Rexene Alberts, MD;  Location: [place];  Service: Open Heart Surgery;  Laterality: N/A;        Home Medications    Prior to Admission medications   Medication Sig Start Date End Date Taking? Authorizing Provider  albuterol (PROVENTIL HFA;VENTOLIN HFA) 108 (90 BASE) MCG/ACT inhaler Inhale 2 puffs into the lungs every 6 (six) hours as needed for wheezing or shortness of breath.   Yes [provider]  amLODipine (NORVASC) 5 MG tablet Take 5 mg by mouth at bedtime. 06/16/18  Yes [provider]  aspirin EC 81 MG tablet Take 81 mg by mouth at bedtime.    Yes [provider]  cetirizine (ZYRTEC) 10 MG tablet Take 10 mg by mouth at bedtime.   Yes [provider]  citalopram (CELEXA) 20 MG tablet Take 20 mg by mouth at bedtime.    Yes [provider]  ezetimibe (ZETIA) 10 MG tablet Take 10 mg by mouth at bedtime. 07/05/18  Yes [provider]  fluticasone (FLONASE) 50 MCG/ACT nasal spray Place 1 spray into the nose 2 (two) times daily.    Yes [provider]  Fluticasone-Salmeterol (ADVAIR) 250-50 MCG/DOSE AEPB Inhale 1 puff into the lungs every 12 (twelve) hours.   Yes [provider]  furosemide (LASIX) 20 MG tablet Take 1 tablet (20 mg total) by mouth daily. 11/23/18  Yes Elgergawy, Silver Huguenin, MD  gabapentin (NEURONTIN) 300 MG capsule Take 300 mg by mouth 3 (three) times daily. 07/09/17  Yes [provider]  insulin aspart (NOVOLOG FLEXPEN) 100 UNIT/ML FlexPen Inject 2-30 Units into the skin 3 (three) times daily as needed for  high blood sugar (CBG >150). Per sliding scale written down at home    Yes [provider]  isosorbide mononitrate (IMDUR) 30 MG 24 hr tablet Take 30 mg by mouth at bedtime. 06/16/18  Yes [provider]  Melatonin 5 MG TABS Take 10 mg by mouth at bedtime.    Yes [provider]  metFORMIN (GLUCOPHAGE) 1000 MG tablet Take 1 tablet (1,000 mg total) by mouth 2 (two) times daily. For the next 2 days as you did receive IV contrast, then resume on 11/26/2018 11/23/18  Yes Elgergawy, Silver Huguenin, MD  metoprolol tartrate (LOPRESSOR) 50 MG tablet Take 50 mg by mouth at bedtime. 07/05/18  Yes [provider]  omeprazole (PRILOSEC) 20 MG capsule Take 20 mg by mouth 2 (two) times daily before a meal.   Yes [provider]  ondansetron (ZOFRAN ODT) 4 MG disintegrating tablet Take 1 tablet (4 mg total) by mouth every 8 (eight) hours as needed for up to 12 days for nausea or vomiting. 11/24/18 12/06/18 Yes Tacy Learn, PA-C  oxyCODONE (ROXICODONE) 5 MG immediate release tablet Take 1 tablet (5 mg total) by mouth every 4 (four) hours as needed for severe pain. 11/24/18  Yes Tacy Learn, PA-C  pravastatin (PRAVACHOL) 20 MG tablet Take 1 tablet (20 mg total) by mouth daily. 09/13/18  Yes [name], MD  topiramate (TOPAMAX) 25 MG capsule Take 25 mg by mouth 2 (two) times daily.   Yes [provider]  TRESIBA FLEXTOUCH 100 UNIT/ML SOPN FlexTouch Pen Inject 60-70 Units into the skin See admin instructions. 60 units in the morning and 70 units at bedtime 08/11/18  Yes [provider]  nitroGLYCERIN (NITROSTAT) 0.4 MG SL tablet Place 1 tablet (0.4 mg total) under the tongue every 5 (five) minutes as needed for chest pain. 08/13/17 11/24/18  [name], MD    Family History Family History  Problem Relation Age of Onset  . Coronary artery disease Father 66    Social History Social History   Tobacco Use  . Smoking status: Never Smoker  .  Smokeless tobacco: Never Used  Substance Use Topics  . Alcohol use: No  . Drug use: No     Allergies   Divalproex sodium; Imdur [isosorbide dinitrate]; Lipitor [atorvastatin calcium]; Statins; Tramadol; Tricor [fenofibrate]; and Valproic acid   Review of Systems Review of Systems  Constitutional: Positive for appetite change, chills, fatigue and fever.  HENT: Negative for rhinorrhea and sore throat.   Eyes: Negative for visual disturbance.  Respiratory: Negative for cough and shortness of breath.   Cardiovascular: Negative for chest pain and leg swelling.  Gastrointestinal: Positive for abdominal pain. Negative for diarrhea, nausea and vomiting.  Genitourinary: Negative for dysuria.  Musculoskeletal: Negative for back pain and neck pain.  Skin: Negative for rash.  Neurological: Negative for dizziness, light-headedness and headaches.  Hematological: Does not bruise/bleed easily.  Psychiatric/Behavioral: Negative for confusion.     Physical Exam Updated Vital Signs BP 125/70   Pulse 88   Temp 98.9 F (37.2 C) (Oral)   Resp 12   Ht 1.753 m (5' 9" )   Wt 107.7 kg   SpO2 95%   BMI 35.06 kg/m   Physical Exam Vitals signs and nursing note reviewed.  Constitutional:      Appearance: He is well-developed.  HENT:     Head: Normocephalic and atraumatic.     Mouth/Throat:     Mouth: Mucous membranes are dry.  Eyes:     Conjunctiva/sclera: Conjunctivae normal.  Neck:     Musculoskeletal: Neck supple.  Cardiovascular:     Rate and Rhythm: Normal rate and regular rhythm.     Heart sounds: No murmur.  Pulmonary:     Effort: Pulmonary effort is normal. No respiratory distress.     Breath sounds: Normal breath sounds. No wheezing or rales.  Abdominal:     General: Bowel sounds are normal.     Palpations: Abdomen is soft.     Tenderness: There is no abdominal tenderness.     Comments: No tenderness to palpation in the right upper quadrant area.  But this is the location of  the patient's pain.  Musculoskeletal: Normal range of motion.        General: No swelling.  Skin:    General: Skin is warm and dry.  Neurological:     General: No focal deficit present.     Mental Status: He is alert and oriented to person, place, and time.      ED Treatments / Results  Labs (all labs ordered are listed, but only abnormal results are displayed) Labs Reviewed  URINALYSIS, ROUTINE W REFLEX MICROSCOPIC - Abnormal; Notable for the following components:      Result Value   Color, Urine AMBER (*)    APPearance HAZY (*)    Hgb urine dipstick SMALL (*)    Protein, ur 100 (*)    All other components within normal limits  COMPREHENSIVE METABOLIC PANEL - Abnormal; Notable for the following components:   Sodium 132 (*)    Glucose, Bld 162 (*)    BUN 32 (*)    Creatinine, Ser 1.41 (*)    Calcium 8.6 (*)    Total Protein 8.4 (*)    Total Bilirubin 1.9 (*)    GFR calc non Af Amer 54 (*)    All other components within normal limits  CBC WITH DIFFERENTIAL/PLATELET - Abnormal; Notable for the following components:   Platelets 93 (*)    Abs Immature Granulocytes 0.17 (*)    All other components within normal limits  LIPASE, BLOOD  INFLUENZA PANEL BY PCR (TYPE A & B)    EKG None  Radiology Dg Chest 2 View  Result Date: 11/25/2018 CLINICAL DATA:  63 year old male with shortness of breath. Possible acute cholecystitis based on CTA chest yesterday. EXAM: CHEST - 2 VIEW COMPARISON:  11/24/2018 and earlier. FINDINGS: Low lung volumes with bibasilar atelectasis. Difficult to exclude small pleural effusions, which would be new since yesterday. No pneumothorax, pulmonary edema or consolidation. Normal cardiac size and mediastinal contours. Prior CABG. No acute osseous abnormality identified. Negative visible bowel gas pattern. IMPRESSION: Low lung volumes with bibasilar atelectasis. Questionable new small pleural effusions. Electronically Signed   By: Genevie Ann M.D.   On: 11/25/2018  20:38   Ct Angio Chest Pe W/cm &/or Wo Cm  Result Date: 11/24/2018 CLINICAL DATA:  Shortness of breath. EXAM: CT ANGIOGRAPHY CHEST WITH CONTRAST TECHNIQUE: Multidetector CT imaging of the chest was performed using the standard protocol during bolus administration of intravenous contrast. Multiplanar CT image reconstructions and MIPs were obtained to evaluate the vascular anatomy. CONTRAST:  10m ISOVUE-370 IOPAMIDOL (ISOVUE-370) INJECTION 76% COMPARISON:  08/13/2017 FINDINGS: Cardiovascular: The heart size is normal. No substantial pericardial effusion. Coronary artery calcification is evident. Status post CABG. No thoracic aortic aneurysm. Assessment of pulmonary arteries is limited by respiratory motion. Some segmental and subsegmental branches have linear low-density in the lumen (see central right lung on 57/5) but this is felt to be more likely related to artifact from the breathing motion and pulmonary embolus. Mediastinum/Nodes: No mediastinal lymphadenopathy. There is no hilar lymphadenopathy. The esophagus has normal imaging features. There is no axillary lymphadenopathy. Lungs/Pleura: No suspicious pulmonary nodule or mass. No focal airspace consolidation. Dependent atelectasis noted in the lungs bilaterally. No pleural effusion. Upper Abdomen: Gallbladder is distended with ill-defined gallbladder wall and some apparent pericholecystic edema.Subtle nodularity of liver contour with large lateral segment left liver and caudate lobe is suggestive of cirrhosis. Musculoskeletal: No worrisome lytic or sclerotic osseous abnormality. Review of the MIP images confirms the above findings. IMPRESSION: 1. No CT evidence for acute pulmonary embolus although assessment of segmental and subsegmental pulmonary arteries is limited by respiratory motion. 2. No focal airspace consolidation or pleural effusion. 3. Distended gallbladder with ill-defined gallbladder wall and some apparent pericholecystic edema. Acute  cholecystitis is a concern. Abdominal ultrasound may prove helpful to further evaluate. 4. Liver morphology suggestive of cirrhosis. Electronically Signed   By: EMisty StanleyM.D.   On: 11/24/2018 19:03   UKoreaAbdomen Limited  Result Date: 11/24/2018 CLINICAL DATA:  RIGHT upper quadrant pain for 2 days with nausea; history hypertension, CHF, diabetes mellitus EXAM: ULTRASOUND ABDOMEN LIMITED RIGHT UPPER QUADRANT COMPARISON:  CT abdomen and pelvis 11/23/2018 FINDINGS: Gallbladder: Distended without stones or wall thickening. No pericholecystic fluid or sonographic Murphy sign. Common bile duct: Diameter: 3 mm diameter, normal Liver: Echogenic parenchyma, likely fatty infiltration though this can be seen with cirrhosis and certain infiltrative disorders. No focal hepatic mass or nodularity. Portal vein is patent on color Doppler imaging with normal direction of blood flow towards the liver. Trace perihepatic ascites. IMPRESSION: Question fatty infiltration of liver as above. Trace perihepatic ascites. Otherwise negative exam. Electronically Signed   By: MLavonia DanaM.D.   On: 11/24/2018 20:55    Procedures Procedures (including critical care time)  Medications Ordered in ED Medications  0.9 %  sodium chloride infusion (has no administration in time range)  sodium chloride 0.9 % bolus 1,000 mL (1,000 mLs Intravenous New Bag/Given 11/25/18 2205)  ondansetron (ZOFRAN) injection 4 mg (4 mg Intravenous Given 11/25/18 2205)  HYDROmorphone (DILAUDID) injection 1 mg (1 mg Intravenous Given 11/25/18 2207)     Initial Impression / Assessment and Plan / ED Course  I have reviewed the triage vital signs and the nursing notes.  Pertinent labs & imaging results that were available during my care of the patient were reviewed by me and considered in my medical decision making (see chart for details).    New symptoms compared with been going on since Wednesday.  When patient started with right upper quadrant pain.   Patient had some periumbilical abdominal pain on Sunday.  But the new symptoms are the hypoxia which is interesting since he had a negative CT Angie of chest yesterday without evidence of any pulmonary embolus.  Or any other acute findings.  And the fever.  Patient has significant right upper quadrant abdominal pain but no tenderness on exam.  Suggestive that some of the symptoms with a negative ultrasound negative CT of the abdomen pelvis that this may be a dysfunctional gallbladder and HIDA scan may be indicated but that should not be responsible for the fever or the hypoxia.  Chest x-ray here today negative for pneumonia.  Influenza testing negative.  Patient's urine was dark.  No evidence of any blood.  No evidence of urinary tract infection.  BUN elevated patient probably dehydrated.  Patient given some IV fluids here and will continue maintenance fluids.  Patient's other labs including LFTs without significant changes from before other than bilirubin slightly more elevated at 1.9.  No significant leukocytosis.    The patient without oxygen at home with cannot be discharged home.  Patient also had negative troponins yesterday.  Exact cause of the fever and the hypoxia is not clear at this time.  Discussed with hospitalist for admission.   Final Clinical Impressions(s) / ED Diagnoses   Final diagnoses:  Right upper quadrant abdominal pain  Fever, unspecified fever cause  Hypoxia    ED Discharge Orders    None       Fredia Sorrow, MD 11/26/18 0006    Fredia Sorrow, MD 11/26/18 [phone]

## 2018-11-25 NOTE — ED Notes (Signed)
ED Provider at bedside. 

## 2018-11-25 NOTE — ED Notes (Signed)
Patient transported to X-ray 

## 2018-11-25 NOTE — ED Notes (Signed)
Received report on pt, pt and family updated on plan of care, oxygen removed, pt denies any use at home, pulse ox dropped to 85%, oxygen replaced on pt again at 2lpm via [place] with improvement of pulse ox to upper 90's

## 2018-11-25 NOTE — ED Triage Notes (Signed)
Pt states that his gall bladder is hurting. Pt was told to return if he got a fever.

## 2018-11-26 ENCOUNTER — Other Ambulatory Visit: Payer: Self-pay

## 2018-11-26 DIAGNOSIS — R509 Fever, unspecified: Secondary | ICD-10-CM

## 2018-11-26 DIAGNOSIS — R1011 Right upper quadrant pain: Secondary | ICD-10-CM

## 2018-11-26 DIAGNOSIS — K7581 Nonalcoholic steatohepatitis (NASH): Secondary | ICD-10-CM | POA: Diagnosis not present

## 2018-11-26 DIAGNOSIS — I5031 Acute diastolic (congestive) heart failure: Secondary | ICD-10-CM | POA: Diagnosis not present

## 2018-11-26 DIAGNOSIS — J9601 Acute respiratory failure with hypoxia: Secondary | ICD-10-CM | POA: Diagnosis present

## 2018-11-26 DIAGNOSIS — R0902 Hypoxemia: Secondary | ICD-10-CM | POA: Diagnosis not present

## 2018-11-26 LAB — COMPREHENSIVE METABOLIC PANEL
ALT: 27 U/L (ref 0–44)
AST: 23 U/L (ref 15–41)
Albumin: 3.1 g/dL — ABNORMAL LOW (ref 3.5–5.0)
Alkaline Phosphatase: 35 U/L — ABNORMAL LOW (ref 38–126)
Anion gap: 10 (ref 5–15)
BUN: 30 mg/dL — ABNORMAL HIGH (ref 6–20)
CO2: 23 mmol/L (ref 22–32)
Calcium: 8.1 mg/dL — ABNORMAL LOW (ref 8.9–10.3)
Chloride: 104 mmol/L (ref 98–111)
Creatinine, Ser: 1.28 mg/dL — ABNORMAL HIGH (ref 0.61–1.24)
GFR calc Af Amer: >60 mL/min (ref >60)
GFR calc non Af Amer: >60 mL/min (ref >60)
Glucose, Bld: 154 mg/dL — ABNORMAL HIGH (ref 70–99)
Potassium: 3.7 mmol/L (ref 3.5–5.1)
Sodium: 137 mmol/L (ref 135–145)
Total Bilirubin: 1.7 mg/dL — ABNORMAL HIGH (ref 0.3–1.2)
Total Protein: 6.8 g/dL (ref 6.5–8.1)

## 2018-11-26 LAB — GLUCOSE, CAPILLARY
Glucose-Capillary: 123 mg/dL — ABNORMAL HIGH (ref 70–99)
Glucose-Capillary: 128 mg/dL — ABNORMAL HIGH (ref 70–99)
Glucose-Capillary: 151 mg/dL — ABNORMAL HIGH (ref 70–99)

## 2018-11-26 LAB — MRSA PCR SCREENING: MRSA by PCR: NEGATIVE

## 2018-11-26 LAB — CBC
HCT: 41.1 % (ref 39.0–52.0)
Hemoglobin: 13.6 g/dL (ref 13.0–17.0)
MCH: 30.7 pg (ref 26.0–34.0)
MCHC: 33.1 g/dL (ref 30.0–36.0)
MCV: 92.8 fL (ref 80.0–100.0)
NRBC: 0 % (ref 0.0–0.2)
Platelets: 82 10*3/uL — ABNORMAL LOW (ref 150–400)
RBC: 4.43 MIL/uL (ref 4.22–5.81)
RDW: 13.8 % (ref 11.5–15.5)
WBC: 7.5 10*3/uL (ref 4.0–10.5)

## 2018-11-26 LAB — HEMOGLOBIN A1C
Hgb A1c MFr Bld: 7.2 % — ABNORMAL HIGH (ref 4.8–5.6)
Mean Plasma Glucose: 159.94 mg/dL

## 2018-11-26 LAB — CBG MONITORING, ED: Glucose-Capillary: 150 mg/dL — ABNORMAL HIGH (ref 70–99)

## 2018-11-26 MED ORDER — INSULIN DEGLUDEC 100 UNIT/ML ~~LOC~~ SOPN: 60.0000 [IU] | PEN_INJECTOR | SUBCUTANEOUS | Status: DC

## 2018-11-26 MED ORDER — SODIUM CHLORIDE 0.9 % IV SOLN
2.0000 g | INTRAVENOUS | Status: DC
  Administered 2018-11-26 – 2018-11-28 (×3): 2 g via INTRAVENOUS
  Filled 2018-11-26 (×2): qty 20
  Filled 2018-11-26 (×2): qty 2

## 2018-11-26 MED ORDER — SODIUM CHLORIDE 0.9 % IV SOLN: 250.0000 mL | INTRAVENOUS | Status: DC | PRN

## 2018-11-26 MED ORDER — ONDANSETRON HCL 4 MG PO TABS
4.0000 mg | ORAL_TABLET | Freq: Four times a day (QID) | ORAL | Status: DC | PRN
  Administered 2018-11-29 (×2): 4 mg via ORAL
  Filled 2018-11-26 (×2): qty 1

## 2018-11-26 MED ORDER — ALBUTEROL SULFATE HFA 108 (90 BASE) MCG/ACT IN AERS
2.0000 | INHALATION_SPRAY | Freq: Four times a day (QID) | RESPIRATORY_TRACT | Status: DC | PRN
  Filled 2018-11-26: qty 6.7

## 2018-11-26 MED ORDER — AMLODIPINE BESYLATE 5 MG PO TABS
5.0000 mg | ORAL_TABLET | Freq: Every day | ORAL | Status: DC
  Administered 2018-11-26 – 2018-11-29 (×5): 5 mg via ORAL
  Filled 2018-11-26 (×5): qty 1

## 2018-11-26 MED ORDER — NITROGLYCERIN 0.4 MG SL SUBL: 0.4000 mg | SUBLINGUAL_TABLET | SUBLINGUAL | Status: DC | PRN

## 2018-11-26 MED ORDER — ACETAMINOPHEN 325 MG PO TABS
650.0000 mg | ORAL_TABLET | Freq: Four times a day (QID) | ORAL | Status: DC | PRN
  Administered 2018-11-26 – 2018-11-27 (×2): 650 mg via ORAL
  Filled 2018-11-26 (×2): qty 2

## 2018-11-26 MED ORDER — ASPIRIN EC 81 MG PO TBEC
81.0000 mg | DELAYED_RELEASE_TABLET | Freq: Every day | ORAL | Status: DC
  Administered 2018-11-26 – 2018-11-29 (×5): 81 mg via ORAL
  Filled 2018-11-26 (×5): qty 1

## 2018-11-26 MED ORDER — METOPROLOL TARTRATE 50 MG PO TABS
50.0000 mg | ORAL_TABLET | Freq: Every day | ORAL | Status: DC
  Administered 2018-11-26 – 2018-11-29 (×5): 50 mg via ORAL
  Filled 2018-11-26 (×5): qty 1

## 2018-11-26 MED ORDER — OXYCODONE HCL 5 MG PO TABS
5.0000 mg | ORAL_TABLET | ORAL | Status: DC | PRN
  Administered 2018-11-26 – 2018-11-28 (×10): 5 mg via ORAL
  Filled 2018-11-26 (×10): qty 1

## 2018-11-26 MED ORDER — MOMETASONE FURO-FORMOTEROL FUM 200-5 MCG/ACT IN AERO
2.0000 | INHALATION_SPRAY | Freq: Two times a day (BID) | RESPIRATORY_TRACT | Status: DC
  Administered 2018-11-27 – 2018-11-30 (×6): 2 via RESPIRATORY_TRACT
  Filled 2018-11-26 (×2): qty 8.8

## 2018-11-26 MED ORDER — TOPIRAMATE 25 MG PO TABS
25.0000 mg | ORAL_TABLET | Freq: Two times a day (BID) | ORAL | Status: DC
  Administered 2018-11-26 – 2018-11-30 (×9): 25 mg via ORAL
  Filled 2018-11-26 (×9): qty 1

## 2018-11-26 MED ORDER — ENOXAPARIN SODIUM 40 MG/0.4ML ~~LOC~~ SOLN: 40.0000 mg | SUBCUTANEOUS | Status: DC

## 2018-11-26 MED ORDER — CITALOPRAM HYDROBROMIDE 20 MG PO TABS
20.0000 mg | ORAL_TABLET | Freq: Every day | ORAL | Status: DC
  Administered 2018-11-26 – 2018-11-29 (×4): 20 mg via ORAL
  Filled 2018-11-26 (×8): qty 1

## 2018-11-26 MED ORDER — FUROSEMIDE 20 MG PO TABS
20.0000 mg | ORAL_TABLET | Freq: Every day | ORAL | Status: DC
  Administered 2018-11-26 – 2018-11-27 (×2): 20 mg via ORAL
  Filled 2018-11-26 (×2): qty 1

## 2018-11-26 MED ORDER — INSULIN GLARGINE 100 UNIT/ML ~~LOC~~ SOLN
60.0000 [IU] | Freq: Every morning | SUBCUTANEOUS | Status: DC
  Administered 2018-11-26 – 2018-11-30 (×4): 60 [IU] via SUBCUTANEOUS
  Filled 2018-11-26 (×6): qty 0.6

## 2018-11-26 MED ORDER — EZETIMIBE 10 MG PO TABS
10.0000 mg | ORAL_TABLET | Freq: Every day | ORAL | Status: DC
  Administered 2018-11-26 – 2018-11-29 (×4): 10 mg via ORAL
  Filled 2018-11-26 (×7): qty 1

## 2018-11-26 MED ORDER — GABAPENTIN 300 MG PO CAPS
300.0000 mg | ORAL_CAPSULE | Freq: Three times a day (TID) | ORAL | Status: DC
  Administered 2018-11-26 – 2018-11-30 (×13): 300 mg via ORAL
  Filled 2018-11-26 (×13): qty 1

## 2018-11-26 MED ORDER — LORATADINE 10 MG PO TABS
10.0000 mg | ORAL_TABLET | Freq: Every day | ORAL | Status: DC
  Administered 2018-11-26 – 2018-11-30 (×4): 10 mg via ORAL
  Filled 2018-11-26 (×4): qty 1

## 2018-11-26 MED ORDER — SODIUM CHLORIDE 0.9% FLUSH: 3.0000 mL | INTRAVENOUS | Status: DC | PRN

## 2018-11-26 MED ORDER — FUROSEMIDE 10 MG/ML IJ SOLN
40.0000 mg | Freq: Once | INTRAMUSCULAR | Status: AC
  Administered 2018-11-26: 40 mg via INTRAVENOUS
  Filled 2018-11-26: qty 4

## 2018-11-26 MED ORDER — ISOSORBIDE MONONITRATE ER 60 MG PO TB24
ORAL_TABLET | ORAL | Status: AC
  Filled 2018-11-26: qty 1

## 2018-11-26 MED ORDER — INSULIN GLARGINE 100 UNIT/ML ~~LOC~~ SOLN
70.0000 [IU] | Freq: Every day | SUBCUTANEOUS | Status: DC
  Administered 2018-11-26 – 2018-11-29 (×4): 70 [IU] via SUBCUTANEOUS
  Filled 2018-11-26 (×5): qty 0.7

## 2018-11-26 MED ORDER — INSULIN ASPART 100 UNIT/ML ~~LOC~~ SOLN
0.0000 [IU] | Freq: Three times a day (TID) | SUBCUTANEOUS | Status: DC
  Administered 2018-11-27: 2 [IU] via SUBCUTANEOUS
  Administered 2018-11-27: 5 [IU] via SUBCUTANEOUS
  Administered 2018-11-27: 2 [IU] via SUBCUTANEOUS

## 2018-11-26 MED ORDER — SODIUM CHLORIDE 0.9% FLUSH
3.0000 mL | Freq: Two times a day (BID) | INTRAVENOUS | Status: DC
  Administered 2018-11-26 – 2018-11-30 (×9): 3 mL via INTRAVENOUS

## 2018-11-26 MED ORDER — ONDANSETRON HCL 4 MG/2ML IJ SOLN: 4.0000 mg | Freq: Four times a day (QID) | INTRAMUSCULAR | Status: DC | PRN

## 2018-11-26 MED ORDER — PRAVASTATIN SODIUM 10 MG PO TABS
20.0000 mg | ORAL_TABLET | Freq: Every day | ORAL | Status: DC
  Administered 2018-11-26 – 2018-11-30 (×4): 20 mg via ORAL
  Filled 2018-11-26: qty 1
  Filled 2018-11-26 (×2): qty 2
  Filled 2018-11-26 (×2): qty 1
  Filled 2018-11-26 (×2): qty 2

## 2018-11-26 MED ORDER — ISOSORBIDE MONONITRATE ER 60 MG PO TB24
30.0000 mg | ORAL_TABLET | Freq: Every day | ORAL | Status: DC
  Administered 2018-11-26 – 2018-11-29 (×4): 30 mg via ORAL
  Filled 2018-11-26 (×7): qty 1

## 2018-11-26 MED ORDER — ALBUTEROL SULFATE (2.5 MG/3ML) 0.083% IN NEBU: 2.5000 mg | INHALATION_SOLUTION | Freq: Four times a day (QID) | RESPIRATORY_TRACT | Status: DC | PRN

## 2018-11-26 MED ORDER — PANTOPRAZOLE SODIUM 40 MG PO TBEC
40.0000 mg | DELAYED_RELEASE_TABLET | Freq: Every day | ORAL | Status: DC
  Administered 2018-11-26 – 2018-11-30 (×4): 40 mg via ORAL
  Filled 2018-11-26 (×4): qty 1

## 2018-11-26 NOTE — H&P (Signed)
TRH H&P    Patient Demographics:    [name] [name], is a 63 y.o. male  MRN: [id number]  DOB - 27-Nov-1955  Admit Date - 11/25/2018  Referring MD/NP/PA: Dr. Bobby Rumpf  Outpatient Primary MD for the patient is [name], Memory Argue, MD  Patient coming from: Home  Chief complaint-right upper quadrant pain   HPI:    [name] [name]  is a 63 y.o. male, with a history of CAD status post PCI and CABG, type 2 diabetes mellitus, hypertension, hyperlipidemia, TIA, obstructive sleep apnea came to ED with chief complaint of abdominal pain.  Patient was recently discharged from the hospital on 15th January after he had cardiac cath.  Patient was found to have heart failure with pEF during cardiac cath.  He was prescribed Lasix but patient's daughter says that she could not get Lasix filled. Today at the PCP office, patient was found to have high fever 102 F and was sent to ED for further evaluation.  In the ED patient had one reading of  low-grade temperature 100.2 otherwise has been afebrile. In the ED patient was found to be mildly hypoxic.  Requiring oxygen via nasal cannula 2 L/min.  Patient has been having shortness of breath, also right upper quadrant pain. He was seen in the ED yesterday underwent CTA of the chest which was negative for pulmonary embolism Abdominal ultrasound showed no cholecystitis. Denies chest pain Denies dysuria No nausea vomiting or diarrhea.    Review of systems:    In addition to the HPI above,    All other systems reviewed and are negative.    Past History of the following :    Past Medical History:  Diagnosis Date  . Anxiety   . Arthritis   . Asthma   . Cataract    Right eye  . CHF (congestive heart failure) ([place])   . Chronic lower back pain   . Coronary atherosclerosis of native coronary artery    a. LAD & OM stenting;  b. 2007 Cath: nonobs dzs, patent stents;  c. 07/2012 neg  MV, EF 61%.  d. 11/20/14: [place] s/p  PCI w/ DES to pLAD and DES to OM1   . Essential hypertension, benign   . GERD (gastroesophageal reflux disease)   . Hearing loss of left ear   . History of gout   . History of hiatal hernia   . Hypercholesteremia   . Lumbar herniated disc   . MI, old   . Migraine   . OSA on CPAP   . 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 Thrombocytopenia ([place])   . TIA (transient ischemic attack) ~ 2010  . Type 2 diabetes mellitus ([place])       Past Surgical History:  Procedure Laterality Date  . ANTERIOR CERVICAL DECOMP/DISCECTOMY FUSION  1998   "C3-4"  . CARDIAC CATHETERIZATION  "several"  . CARDIAC CATHETERIZATION N/A 12/30/2015   Procedure: Left Heart Cath and Coronary Angiography;  Surgeon: [name], MD;  Location: [facility];  Service: Cardiovascular;  Laterality: N/A;  . CARPAL TUNNEL RELEASE Bilateral 2005  . COLONOSCOPY    . CORONARY ANGIOPLASTY    . CORONARY ANGIOPLASTY WITH STENT PLACEMENT  2002; 2003; 11/20/2014   "I have 4 stents after today" (11/20/2014)  . CORONARY ARTERY BYPASS GRAFT N/A 01/09/2016   Procedure: CORONARY ARTERY BYPASS GRAFTING (CABG) X 3 UTILIZING RIGHT AND LEFT INTERNAL MAMMARY ARTERY AND ENDOSCOPICALLY HARVESTED SAPHENEOUS VEIN.;  Surgeon: Rexene Alberts, MD;  Location: [facility];  Service: Open Heart Surgery;  Laterality: N/A;  . ESOPHAGOGASTRODUODENOSCOPY    . KNEE SURGERY Left 02/2012   "scraped; open"  . LEFT HEART CATH AND CORS/GRAFTS ANGIOGRAPHY N/A 08/09/2017   Procedure: LEFT HEART CATH AND CORS/GRAFTS ANGIOGRAPHY;  Surgeon: [name], MD;  Location: [facility];  Service: Cardiovascular;  Laterality: N/A;  . LEFT HEART CATH AND CORS/GRAFTS ANGIOGRAPHY N/A 11/22/2018   Procedure: LEFT HEART CATH AND CORS/GRAFTS ANGIOGRAPHY;  Surgeon: [name], [name], MD;  Location: [facility];  Service: Cardiovascular;  Laterality: N/A;  . LEFT  HEART CATHETERIZATION WITH CORONARY ANGIOGRAM N/A 07/20/2012   Procedure: LEFT HEART CATHETERIZATION WITH CORONARY ANGIOGRAM;  Surgeon: [name], MD;  Location: [facility];  Service: Cardiovascular;  Laterality: N/A;  . LEFT HEART CATHETERIZATION WITH CORONARY ANGIOGRAM N/A 11/20/2014   Procedure: LEFT HEART CATHETERIZATION WITH CORONARY ANGIOGRAM;  Surgeon: [name], MD;  Location: [facility] C[facility];  Service: Cardiovascular;  Laterality: N/A;  . LEFT HEART CATHETERIZATION WITH CORONARY ANGIOGRAM N/A 11/26/2014   Procedure: LEFT HEART CATHETERIZATION WITH CORONARY ANGIOGRAM;  Surgeon: [name], MD;  Location: [facility] CATH LAB;  Service: Cardiovascular;  Laterality: N/A;  . NEUROPLASTY / TRANSPOSITION ULNAR NERVE AT ELBOW Right ~ 2012  . PERCUTANEOUS CORONARY ROTOBLATOR INTERVENTION (PCI-R)  11/20/2014   Procedure: PERCUTANEOUS CORONARY ROTOBLATOR INTERVENTION (PCI-R);  Surgeon: [name], MD;  Location: [facility] CATH LAB;  Service: Cardiovascular;;  . SHOULDER ARTHROSCOPY Left ~ 2011  . TEE WITHOUT CARDIOVERSION N/A 01/09/2016   Procedure: TRANSESOPHAGEAL ECHOCARDIOGRAM (TEE);  Surgeon: Rexene Alberts, MD;  Location: [facility];  Service: Open Heart Surgery;  Laterality: N/A;      Social History:      Social History   Tobacco Use  . Smoking status: Never Smoker  . Smokeless tobacco: Never Used  Substance Use Topics  . Alcohol use: No       Family History :     Family History  Problem Relation Age of Onset  . Coronary artery disease Father 29      Home Medications:   Prior to Admission medications   Medication Sig Start Date End Date Taking? Authorizing Provider  albuterol (PROVENTIL HFA;VENTOLIN HFA) 108 (90 BASE) MCG/ACT inhaler Inhale 2 puffs into the lungs every 6 (six) hours as needed for wheezing or shortness of breath.   Yes [provider]  amLODipine (NORVASC) 5 MG tablet Take 5 mg by mouth at bedtime. 06/16/18  Yes [provider]  aspirin EC 81 MG  tablet Take 81 mg by mouth at bedtime.    Yes [provider]  cetirizine (ZYRTEC) 10 MG tablet Take 10 mg by mouth at bedtime.   Yes [provider]  citalopram (CELEXA) 20 MG tablet Take 20 mg by mouth at bedtime.    Yes [provider]  ezetimibe (ZETIA) 10 MG tablet Take 10 mg by mouth at bedtime. 07/05/18  Yes [provider]  fluticasone (FLONASE) 50 MCG/ACT nasal spray Place 1 spray into the nose 2 (two) times daily.    Yes [provider]  Fluticasone-Salmeterol (ADVAIR) 250-50 MCG/DOSE AEPB Inhale 1 puff into the lungs every 12 (twelve) hours.   Yes [provider]  furosemide (LASIX) 20 MG tablet Take 1 tablet (20 mg total) by mouth daily. 11/23/18  Yes Elgergawy, Silver Huguenin, MD  gabapentin (NEURONTIN) 300 MG capsule Take 300 mg by mouth 3 (three) times daily. 07/09/17  Yes [provider]  insulin aspart (NOVOLOG FLEXPEN) 100 UNIT/ML FlexPen Inject 2-30 Units into the skin 3 (three) times daily as needed for high blood sugar (CBG >150). Per sliding scale written down at home    Yes [provider]  isosorbide mononitrate (IMDUR) 30 MG 24 hr tablet Take 30 mg by mouth at bedtime. 06/16/18  Yes [provider]  Melatonin 5 MG TABS Take 10 mg by mouth at bedtime.    Yes [provider]  metFORMIN (GLUCOPHAGE) 1000 MG tablet Take 1 tablet (1,000 mg total) by mouth 2 (two) times daily. For the next 2 days as you did receive IV contrast, then resume on 11/26/2018 11/23/18  Yes Elgergawy, Silver Huguenin, MD  metoprolol tartrate (LOPRESSOR) 50 MG tablet Take 50 mg by mouth at bedtime. 07/05/18  Yes [provider]  omeprazole (PRILOSEC) 20 MG capsule Take 20 mg by mouth 2 (two) times daily before a meal.   Yes [provider]  ondansetron (ZOFRAN ODT) 4 MG disintegrating tablet Take 1 tablet (4 mg total) by mouth every 8 (eight) hours as needed for up to 12 days for nausea or vomiting. 11/24/18 12/06/18 Yes  Tacy Learn, PA-C  oxyCODONE (ROXICODONE) 5 MG immediate release tablet Take 1 tablet (5 mg total) by mouth every 4 (four) hours as needed for severe pain. 11/24/18  Yes Tacy Learn, PA-C  pravastatin (PRAVACHOL) 20 MG tablet Take 1 tablet (20 mg total) by mouth daily. 09/13/18  Yes [name], MD  topiramate (TOPAMAX) 25 MG capsule Take 25 mg by mouth 2 (two) times daily.   Yes [provider]  TRESIBA FLEXTOUCH 100 UNIT/ML SOPN FlexTouch Pen Inject 60-70 Units into the skin See admin instructions. 60 units in the morning and 70 units at bedtime 08/11/18  Yes [provider]  nitroGLYCERIN (NITROSTAT) 0.4 MG SL tablet Place 1 tablet (0.4 mg total) under the tongue every 5 (five) minutes as needed for chest pain. 08/13/17 11/24/18  [name], MD     Allergies:     Allergies  Allergen Reactions  . Divalproex Sodium Other (See Comments)    Causes anger  . Imdur [Isosorbide Dinitrate] Other (See Comments)    Severe headache (trying lower dose 07/05/18)  . Lipitor [Atorvastatin Calcium]     cramps  . Statins Other (See Comments)    Muscle aches and cramps  . Tramadol Other (See Comments)    Chest pain   . Tricor [Fenofibrate] Other (See Comments)    Leg cramps  . Valproic Acid Other (See Comments)    "anger"     Physical Exam:   Vitals  Blood pressure 137/72, pulse 89, temperature 98.8 F (37.1 C), temperature source Oral, resp. rate 14, height 5' 9"  (1.753 m), weight 107.7 kg, SpO2 92 %.  1.  General: Appears in no acute distress  2. Psychiatric: Alert, oriented x3, intact judgment and insight  3. Neurologic: Cranial nerves II 12  grossly intact, motor strength 5/5 in all extremities  4. HEENMT:  Atraumatic normocephalic, oral mucosa is pink and moist  5. Respiratory : Bibasilar crackles auscultated  6. Cardiovascular : S1-S2, regular, no murmur auscultated.  No edema in the lower extremities  7. Gastrointestinal:  Abdomen soft,  nontender, no organomegaly  8. Skin:  No rash noted  9.Musculoskeletal:  No limitation of range of motion of joints in upper and lower extremities    Data Review:    CBC Recent Labs  Lab 11/20/18 1520  11/22/18 0641 11/23/18 0518 11/23/18 2058 11/24/18 1323 11/25/18 2200  WBC 2.6*   < > 2.8* 2.7* 5.6 10.3 8.5  HGB 14.2   < > 13.1 13.5 16.4 16.4 15.4  HCT 41.3   < > 38.6* 39.0 47.4 48.9 47.1  PLT 88*   < > 77* 73* 76* 101* 93*  MCV 89.4   < > 89.4 90.9 88.6 90.7 93.3  MCH 30.7   < > 30.3 31.5 30.7 30.4 30.5  MCHC 34.4   < > 33.9 34.6 34.6 33.5 32.7  RDW 12.9   < > 12.8 13.2 13.1 13.4 14.2  LYMPHSABS 1.0  --  1.0  --   --   --  1.1  MONOABS 0.2  --  0.3  --   --   --  0.5  EOSABS 0.1  --  0.1  --   --   --  0.0  BASOSABS 0.0  --  0.0  --   --   --  0.0   < > = values in this interval not displayed.   ------------------------------------------------------------------------------------------------------------------  Results for orders placed or performed during the hospital encounter of 11/25/18 (from the past 48 hour(s))  Urinalysis, Routine w reflex microscopic     Status: Abnormal   Collection Time: 11/25/18  9:44 PM  Result Value Ref Range   Color, Urine AMBER (A) YELLOW    Comment: BIOCHEMICALS MAY BE AFFECTED BY COLOR   APPearance HAZY (A) CLEAR   Specific Gravity, Urine 1.028 1.005 - 1.030   pH 5.0 5.0 - 8.0   Glucose, UA NEGATIVE NEGATIVE mg/dL   Hgb urine dipstick SMALL (A) NEGATIVE   Bilirubin Urine NEGATIVE NEGATIVE   Ketones, ur NEGATIVE NEGATIVE mg/dL   Protein, ur 100 (A) NEGATIVE mg/dL   Nitrite NEGATIVE NEGATIVE   Leukocytes, UA NEGATIVE NEGATIVE   RBC / HPF 0-5 0 - 5 RBC/hpf   WBC, UA 0-5 0 - 5 WBC/hpf   Bacteria, UA NONE SEEN NONE SEEN   Squamous Epithelial / LPF 0-5 0 - 5   Mucus PRESENT     Comment: Performed at [facility], [address], [place], [place] [address]  Influenza panel by PCR (type A & B)     Status: None   Collection Time:  11/25/18  9:46 PM  Result Value Ref Range   Influenza A By PCR NEGATIVE NEGATIVE   Influenza B By PCR NEGATIVE NEGATIVE    Comment: (NOTE) The Xpert Xpress Flu assay is intended as an aid in the diagnosis of  influenza and should not be used as a sole basis for treatment.  This  assay is FDA approved for nasopharyngeal swab specimens only. Nasal  washings and aspirates are unacceptable for Xpert Xpress Flu testing. Performed at [facility], [address], [place], [place] [address]   Lipase, blood     Status: None   Collection Time: 11/25/18 10:00 PM  Result Value Ref Range  Lipase 22 11 - 51 U/L    Comment: Performed at [facility], [address] [place], [place] [address]  Comprehensive metabolic panel     Status: Abnormal   Collection Time: 11/25/18 10:00 PM  Result Value Ref Range   Sodium 132 (L) 135 - 145 mmol/L   Potassium 3.6 3.5 - 5.1 mmol/L   Chloride 100 98 - 111 mmol/L   CO2 24 22 - 32 mmol/L   Glucose, Bld 162 (H) 70 - 99 mg/dL   BUN 32 (H) 6 - 20 mg/dL   Creatinine, Ser 1.41 (H) 0.61 - 1.24 mg/dL   Calcium 8.6 (L) 8.9 - 10.3 mg/dL   Total Protein 8.4 (H) 6.5 - 8.1 g/dL   Albumin 3.8 3.5 - 5.0 g/dL   AST 30 15 - 41 U/L   ALT 33 0 - 44 U/L   Alkaline Phosphatase 44 38 - 126 U/L   Total Bilirubin 1.9 (H) 0.3 - 1.2 mg/dL   GFR calc non Af Amer 54 (L) >60 mL/min   GFR calc Af Amer >60 >60 mL/min   Anion gap 8 5 - 15    Comment: Performed at [facility], [address], [place], [place] [address]  CBC with Differential/Platelet     Status: Abnormal   Collection Time: 11/25/18 10:00 PM  Result Value Ref Range   WBC 8.5 4.0 - 10.5 K/uL   RBC 5.05 4.22 - 5.81 MIL/uL   Hemoglobin 15.4 13.0 - 17.0 g/dL   HCT 47.1 39.0 - 52.0 %   MCV 93.3 80.0 - 100.0 fL   MCH 30.5 26.0 - 34.0 pg   MCHC 32.7 30.0 - 36.0 g/dL   RDW 14.2 11.5 - 15.5 %   Platelets 93 (L) 150 - 400 K/uL    Comment: SPECIMEN CHECKED FOR CLOTS   nRBC 0.0 0.0 - 0.2 %   Neutrophils Relative % 79 %    Neutro Abs 6.6 1.7 - 7.7 K/uL   Lymphocytes Relative 13 %   Lymphs Abs 1.1 0.7 - 4.0 K/uL   Monocytes Relative 6 %   Monocytes Absolute 0.5 0.1 - 1.0 K/uL   Eosinophils Relative 0 %   Eosinophils Absolute 0.0 0.0 - 0.5 K/uL   Basophils Relative 0 %   Basophils Absolute 0.0 0.0 - 0.1 K/uL   Immature Granulocytes 2 %   Abs Immature Granulocytes 0.17 (H) 0.00 - 0.07 K/uL    Comment: Performed at [facility], [address] [place], [place] [address]    Chemistries  Recent Labs  Lab 11/21/18 0250 11/23/18 0518 11/23/18 2058 11/24/18 1323 11/25/18 2200  NA 137 141 136 136 132*  K 3.9 3.9 3.6 4.4 3.6  CL 108 107 105 100 100  CO2 21* 25 19* 22 24  GLUCOSE 246* 105* 171* 230* 162*  BUN 19 12 17 15  32*  CREATININE 0.99 0.97 0.81 1.32* 1.41*  CALCIUM 8.2* 8.8* 9.5 9.3 8.6*  AST 32  --  38 40 30  ALT 33  --  37 33 33  ALKPHOS 38  --  58 49 44  BILITOT 0.5  --  0.8 1.3* 1.9*   ------------------------------------------------------------------------------------------------------------------  ------------------------------------------------------------------------------------------------------------------ GFR: Estimated Creatinine Clearance: 67.4 mL/min (A) (by C-G formula based on SCr of 1.41 mg/dL (H)). Liver Function Tests: Recent Labs  Lab 11/21/18 0250 11/23/18 2058 11/24/18 1323 11/25/18 2200  AST 32 38 40 30  ALT 33 37 33 33  ALKPHOS 38 58 49 44  BILITOT 0.5 0.8  1.3* 1.9*  PROT 6.2* 8.4* 7.9 8.4*  ALBUMIN 3.2* 4.1 3.8 3.8   Recent Labs  Lab 11/23/18 2058 11/24/18 1323 11/25/18 2200  LIPASE 36 26 22   No results for input(s): AMMONIA in the last 168 hours. Coagulation Profile: Recent Labs  Lab 11/20/18 1520  INR 1.06   Cardiac Enzymes: Recent Labs  Lab 11/20/18 1520 11/20/18 2240 11/21/18 0250 11/23/18 2058  TROPONINI <0.03 <0.03 <0.03 <0.03   BNP (last 3 results) No results for input(s): PROBNP in the last 8760 hours. HbA1C: No results for  input(s): HGBA1C in the last 72 hours. CBG: Recent Labs  Lab 11/22/18 1140 11/22/18 1731 11/22/18 2209 11/23/18 0800 11/23/18 1127  GLUCAP 102* 91 151* 78 144*   Lipid Profile: No results for input(s): CHOL, HDL, LDLCALC, TRIG, CHOLHDL, LDLDIRECT in the last 72 hours. Thyroid Function Tests: No results for input(s): TSH, T4TOTAL, FREET4, T3FREE, THYROIDAB in the last 72 hours. Anemia Panel: No results for input(s): VITAMINB12, FOLATE, FERRITIN, TIBC, IRON, RETICCTPCT in the last 72 hours.  --------------------------------------------------------------------------------------------------------------- Urine analysis:    Component Value Date/Time   COLORURINE AMBER (A) 11/25/2018 2144   APPEARANCEUR HAZY (A) 11/25/2018 2144   LABSPEC 1.028 11/25/2018 2144   PHURINE 5.0 11/25/2018 2144   GLUCOSEU NEGATIVE 11/25/2018 2144   HGBUR SMALL (A) 11/25/2018 2144   BILIRUBINUR NEGATIVE 11/25/2018 2144   KETONESUR NEGATIVE 11/25/2018 2144   PROTEINUR 100 (A) 11/25/2018 2144   UROBILINOGEN 0.2 12/04/2013 0730   NITRITE NEGATIVE 11/25/2018 2144   LEUKOCYTESUR NEGATIVE 11/25/2018 2144      Imaging Results:    Dg Chest 2 View  Result Date: 11/25/2018 CLINICAL DATA:  63 year old male with shortness of breath. Possible acute cholecystitis based on CTA chest yesterday. EXAM: CHEST - 2 VIEW COMPARISON:  11/24/2018 and earlier. FINDINGS: Low lung volumes with bibasilar atelectasis. Difficult to exclude small pleural effusions, which would be new since yesterday. No pneumothorax, pulmonary edema or consolidation. Normal cardiac size and mediastinal contours. Prior CABG. No acute osseous abnormality identified. Negative visible bowel gas pattern. IMPRESSION: Low lung volumes with bibasilar atelectasis. Questionable new small pleural effusions. Electronically Signed   By: Genevie Ann M.D.   On: 11/25/2018 20:38   Ct Angio Chest Pe W/cm &/or Wo Cm  Result Date: 11/24/2018 CLINICAL DATA:  Shortness of  breath. EXAM: CT ANGIOGRAPHY CHEST WITH CONTRAST TECHNIQUE: Multidetector CT imaging of the chest was performed using the standard protocol during bolus administration of intravenous contrast. Multiplanar CT image reconstructions and MIPs were obtained to evaluate the vascular anatomy. CONTRAST:  25m ISOVUE-370 IOPAMIDOL (ISOVUE-370) INJECTION 76% COMPARISON:  08/13/2017 FINDINGS: Cardiovascular: The heart size is normal. No substantial pericardial effusion. Coronary artery calcification is evident. Status post CABG. No thoracic aortic aneurysm. Assessment of pulmonary arteries is limited by respiratory motion. Some segmental and subsegmental branches have linear low-density in the lumen (see central right lung on 57/5) but this is felt to be more likely related to artifact from the breathing motion and pulmonary embolus. Mediastinum/Nodes: No mediastinal lymphadenopathy. There is no hilar lymphadenopathy. The esophagus has normal imaging features. There is no axillary lymphadenopathy. Lungs/Pleura: No suspicious pulmonary nodule or mass. No focal airspace consolidation. Dependent atelectasis noted in the lungs bilaterally. No pleural effusion. Upper Abdomen: Gallbladder is distended with ill-defined gallbladder wall and some apparent pericholecystic edema.Subtle nodularity of liver contour with large lateral segment left liver and caudate lobe is suggestive of cirrhosis. Musculoskeletal: No worrisome lytic or sclerotic osseous abnormality. Review of the MIP  images confirms the above findings. IMPRESSION: 1. No CT evidence for acute pulmonary embolus although assessment of segmental and subsegmental pulmonary arteries is limited by respiratory motion. 2. No focal airspace consolidation or pleural effusion. 3. Distended gallbladder with ill-defined gallbladder wall and some apparent pericholecystic edema. Acute cholecystitis is a concern. Abdominal ultrasound may prove helpful to further evaluate. 4. Liver  morphology suggestive of cirrhosis. Electronically Signed   By: Misty Stanley M.D.   On: 11/24/2018 19:03   US Abdomen Limited  Result Date: 11/24/2018 CLINICAL DATA:  RIGHT upper quadrant pain for 2 days with nausea; history hypertension, CHF, diabetes mellitus EXAM: ULTRASOUND ABDOMEN LIMITED RIGHT UPPER QUADRANT COMPARISON:  CT abdomen and pelvis 11/23/2018 FINDINGS: Gallbladder: Distended without stones or wall thickening. No pericholecystic fluid or sonographic Murphy sign. Common bile duct: Diameter: 3 mm diameter, normal Liver: Echogenic parenchyma, likely fatty infiltration though this can be seen with cirrhosis and certain infiltrative disorders. No focal hepatic mass or nodularity. Portal vein is patent on color Doppler imaging with normal direction of blood flow towards the liver. Trace perihepatic ascites. IMPRESSION: Question fatty infiltration of liver as above. Trace perihepatic ascites. Otherwise negative exam. Electronically Signed   By: Lavonia Dana M.D.   On: 11/24/2018 20:55    My personal review of EKG: Rhythm NSR   Assessment & Plan:    Active Problems:   Acute diastolic CHF (congestive heart failure) ([place])   1. Acute diastolic CHF-patient found to have elevated left ventricular filling pressure on cardiac cath, started on low-dose Lasix at the time of discharge but patient has not been able to get Lasix prescription filled.  Now presenting with hypoxia, bibasilar crackles.  We will give 1 dose of Lasix 40 mg IV x1 in the ED.  Will follow renal function in a.m.  2. Right upper quadrant pain-patient may have a calculus cholecystitis, abdominal ultrasound showed no cholecystitis.  It also showed fatty infiltration of the liver, which also could explain the right upper quadrant pain.  Will obtain HIDA scan in a.m once patient breathing is  improved..  3. Diabetes mellitus type 2-Metformin is on hold, start sliding scale insulin with NovoLog.  Continue Tresiba 60 units in a.m. and  70 units at bedtime  4. CAD status post CABG-Continue statin, aspirin, beta-blocker, Zetia  5. Obstructive sleep apnea-continue CPAP nightly  6. Hypertension-blood pressure stable, continue home medications.    DVT Prophylaxis-   Lovenox   AM Labs Ordered, also please review Full Orders  Family Communication: Admission, patients condition and plan of care including tests being ordered have been discussed with the patient and his daughter at bedside who indicate understanding and agree with the plan and Code Status.  Code Status: Full code  Admission status: Observation: Based on patients clinical presentation and evaluation of above clinical data, I have made determination that patient will need less than 2 midnight stay in the hospital.  Patient coming with with subacute heart failure.  Time spent in minutes : 60 minutes   Oswald Hillock M.D on 11/26/2018 at 12:53 AM

## 2018-11-26 NOTE — Progress Notes (Signed)
Patient admitted to the hospital earlier this morning by Dr. Darrick Meigs.  Patient seen and examined.  Continues to have right upper quadrant pain.  This is worse with eating and drinking.  He has associated vomiting when he tries to eat.  He was noted to be febrile on admission.  He is tender to palpation of the right upper quadrant.  Feels that the shortness of breath is directly related to his abdominal pain which is causing him the splint and not take deep breaths.  Patient admitted to the hospital with worsening persistent right upper quadrant pain, vomiting and inability to eat or drink.  Imaging thus far of the gallbladder has been inconclusive.  It shows that it was enlarged, but no stones or thickening.  His T bili is elevated.  He is also febrile.  Start the patient on intravenous antibiotics.  Appreciate Dr. Constance Haw input.  Plan is for HIDA scan on Monday.  Continue on clear liquids for now.  [employer]

## 2018-11-26 NOTE — Progress Notes (Signed)
Patient is resting on 2 LPM [place] and at this time his wife explained that he hasn't worn CPAP in over a year due to supply issues. I informed the him and his wife that we have a machine and equipment available to use for him if he needs it. Nurse was also made aware about the situation.

## 2018-11-26 NOTE — H&P (View-Only) (Signed)
[facility] Consult  Reason for Consult: Cholecystitis ?  Referring Physician:  Dr. Roderic Palau  Chief Complaint    Abdominal Pain      [name] [name] is a 63 y.o. male.  HPI: [name] is a 63 yo who has had issues with some recent RUQ pain and fevers that is concerning for cholecystitis. He was recently admitted to the hospital with left sided pain and SOB and had a workup including CTA chest which was negative for a PE but did show a distended gallbladder, cardiac catheterization that demonstrated severe stable coronary artery disease and did not get any intervention as medical management was felt to be sufficient.  He was also seen by Hematology during that admission for chronic thrombocytopenia that they feel is related to fatty liver and splenomegaly and not a primary bone marrow issue.   He was discharged from the hospital after the cardiac workup and Hematology consult, and started to have this RUQ pain that is worse with eating. He says that the pain is now constant and he has been having fevers.  An [place] was performed that demonstrated no signs of stones or wall thickening, but he continues to be tender in the RUQ. A HIDA scan has been ordered but this cannot be completed until Monday due to the non-emergent nature.     He has had some nausea and vomiting but is now reporting he is somewhat hungry.  He has been started on antibiotics.   ECHO 11/2018 - EF 55-60 %, LVH, no wall motion abnormality   Past Medical History:  Diagnosis Date  . Anxiety   . Arthritis   . Asthma   . Cataract    Right eye  . CHF (congestive heart failure) ([place])   . Chronic lower back pain   . Coronary atherosclerosis of native coronary artery    a. LAD & OM stenting;  b. 2007 Cath: nonobs dzs, patent stents;  c. 07/2012 neg MV, EF 61%.  d. 11/20/14: [place] s/p  PCI w/ DES to pLAD and DES to OM1   . Essential hypertension, benign   . GERD (gastroesophageal reflux disease)   . Hearing loss of  left ear   . History of gout   . History of hiatal hernia   . Hypercholesteremia   . Lumbar herniated disc   . MI, old   . Migraine   . OSA on CPAP   . 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 Thrombocytopenia ([place])   . TIA (transient ischemic attack) ~ 2010  . Type 2 diabetes mellitus ([place])     Past Surgical History:  Procedure Laterality Date  . ANTERIOR CERVICAL DECOMP/DISCECTOMY FUSION  1998   "C3-4"  . CARDIAC CATHETERIZATION  "several"  . CARDIAC CATHETERIZATION N/A 12/30/2015   Procedure: Left Heart Cath and Coronary Angiography;  Surgeon: [name], MD;  Location: [place] [facility];  Service: Cardiovascular;  Laterality: N/A;  . CARPAL TUNNEL RELEASE Bilateral 2005  . COLONOSCOPY    . CORONARY ANGIOPLASTY    . CORONARY ANGIOPLASTY WITH STENT PLACEMENT  2002; 2003; 11/20/2014   "I have 4 stents after today" (11/20/2014)  . CORONARY ARTERY BYPASS GRAFT N/A 01/09/2016   Procedure: CORONARY ARTERY BYPASS GRAFTING (CABG) X 3 UTILIZING RIGHT AND LEFT INTERNAL MAMMARY ARTERY AND ENDOSCOPICALLY HARVESTED SAPHENEOUS VEIN.;  Surgeon: Rexene Alberts, MD;  Location: [name]  OR;  Service: Open Heart Surgery;  Laterality: N/A;  . ESOPHAGOGASTRODUODENOSCOPY    . KNEE SURGERY Left 02/2012   "scraped; open"  . LEFT HEART CATH AND CORS/GRAFTS ANGIOGRAPHY N/A 08/09/2017   Procedure: LEFT HEART CATH AND CORS/GRAFTS ANGIOGRAPHY;  Surgeon: [name], MD;  Location: [place] [facility];  Service: Cardiovascular;  Laterality: N/A;  . LEFT HEART CATH AND CORS/GRAFTS ANGIOGRAPHY N/A 11/22/2018   Procedure: LEFT HEART CATH AND CORS/GRAFTS ANGIOGRAPHY;  Surgeon: [name], [name], MD;  Location: [place] [facility];  Service: Cardiovascular;  Laterality: N/A;  . LEFT HEART CATHETERIZATION WITH CORONARY ANGIOGRAM N/A 07/20/2012   Procedure: LEFT HEART CATHETERIZATION WITH CORONARY ANGIOGRAM;  Surgeon: [name], MD;  Location: [place]  CATH LAB;  Service: Cardiovascular;  Laterality: N/A;  . LEFT HEART CATHETERIZATION WITH CORONARY ANGIOGRAM N/A 11/20/2014   Procedure: LEFT HEART CATHETERIZATION WITH CORONARY ANGIOGRAM;  Surgeon: [name], MD;  Location: [facility] CATH LAB;  Service: Cardiovascular;  Laterality: N/A;  . LEFT HEART CATHETERIZATION WITH CORONARY ANGIOGRAM N/A 11/26/2014   Procedure: LEFT HEART CATHETERIZATION WITH CORONARY ANGIOGRAM;  Surgeon: [name], MD;  Location: [facility] CATH LAB;  Service: Cardiovascular;  Laterality: N/A;  . NEUROPLASTY / TRANSPOSITION ULNAR NERVE AT ELBOW Right ~ 2012  . PERCUTANEOUS CORONARY ROTOBLATOR INTERVENTION (PCI-R)  11/20/2014   Procedure: PERCUTANEOUS CORONARY ROTOBLATOR INTERVENTION (PCI-R);  Surgeon: [name], MD;  Location: [facility] CATH LAB;  Service: Cardiovascular;;  . SHOULDER ARTHROSCOPY Left ~ 2011  . TEE WITHOUT CARDIOVERSION N/A 01/09/2016   Procedure: TRANSESOPHAGEAL ECHOCARDIOGRAM (TEE);  Surgeon: Rexene Alberts, MD;  Location: [place];  Service: Open Heart Surgery;  Laterality: N/A;    Family History  Problem Relation Age of Onset  . Coronary artery disease Father 73    Social History   Tobacco Use  . Smoking status: Never Smoker  . Smokeless tobacco: Never Used  Substance Use Topics  . Alcohol use: No  . Drug use: No    Medications:  I have reviewed the patient's current medications. Prior to Admission:  Medications Prior to Admission  Medication Sig Dispense Refill Last Dose  . albuterol (PROVENTIL HFA;VENTOLIN HFA) 108 (90 BASE) MCG/ACT inhaler Inhale 2 puffs into the lungs every 6 (six) hours as needed for wheezing or shortness of breath.   11/24/2018 at Unknown time  . amLODipine (NORVASC) 5 MG tablet Take 5 mg by mouth at bedtime.  4 11/24/2018 at Unknown time  . aspirin EC 81 MG tablet Take 81 mg by mouth at bedtime.    11/24/2018 at Unknown time  . cetirizine (ZYRTEC) 10 MG tablet Take 10 mg by mouth at bedtime.   11/24/2018 at Unknown time  .  citalopram (CELEXA) 20 MG tablet Take 20 mg by mouth at bedtime.    11/24/2018 at Unknown time  . ezetimibe (ZETIA) 10 MG tablet Take 10 mg by mouth at bedtime.   11/24/2018 at Unknown time  . fluticasone (FLONASE) 50 MCG/ACT nasal spray Place 1 spray into the nose 2 (two) times daily.    11/24/2018 at Unknown time  . Fluticasone-Salmeterol (ADVAIR) 250-50 MCG/DOSE AEPB Inhale 1 puff into the lungs every 12 (twelve) hours.   11/24/2018 at Unknown time  . furosemide (LASIX) 20 MG tablet Take 1 tablet (20 mg total) by mouth daily. 30 tablet 1 11/24/2018 at Unknown time  . gabapentin (NEURONTIN) 300 MG capsule Take 300 mg by mouth 3 (three) times daily.  2 11/24/2018 at Unknown time  .  insulin aspart (NOVOLOG FLEXPEN) 100 UNIT/ML FlexPen Inject 2-30 Units into the skin 3 (three) times daily as needed for high blood sugar (CBG >150). Per sliding scale written down at home    11/24/2018 at Unknown time  . isosorbide mononitrate (IMDUR) 30 MG 24 hr tablet Take 30 mg by mouth at bedtime.  4 11/24/2018 at Unknown time  . Melatonin 5 MG TABS Take 10 mg by mouth at bedtime.    11/24/2018 at Unknown time  . metFORMIN (GLUCOPHAGE) 1000 MG tablet Take 1 tablet (1,000 mg total) by mouth 2 (two) times daily. For the next 2 days as you did receive IV contrast, then resume on 11/26/2018 60 tablet 11 11/24/2018 at Unknown time  . metoprolol tartrate (LOPRESSOR) 50 MG tablet Take 50 mg by mouth at bedtime.   11/24/2018 at Unknown time  . omeprazole (PRILOSEC) 20 MG capsule Take 20 mg by mouth 2 (two) times daily before a meal.   11/24/2018 at Unknown time  . ondansetron (ZOFRAN ODT) 4 MG disintegrating tablet Take 1 tablet (4 mg total) by mouth every 8 (eight) hours as needed for up to 12 days for nausea or vomiting. 20 tablet 0 11/24/2018 at Unknown time  . oxyCODONE (ROXICODONE) 5 MG immediate release tablet Take 1 tablet (5 mg total) by mouth every 4 (four) hours as needed for severe pain. 12 tablet 0 11/24/2018 at Unknown time  .  pravastatin (PRAVACHOL) 20 MG tablet Take 1 tablet (20 mg total) by mouth daily. 10 tablet 0 11/24/2018 at Unknown time  . topiramate (TOPAMAX) 25 MG capsule Take 25 mg by mouth 2 (two) times daily.   11/24/2018 at Unknown time  . TRESIBA FLEXTOUCH 100 UNIT/ML SOPN FlexTouch Pen Inject 60-70 Units into the skin See admin instructions. 60 units in the morning and 70 units at bedtime  5 11/24/2018 at Unknown time  . nitroGLYCERIN (NITROSTAT) 0.4 MG SL tablet Place 1 tablet (0.4 mg total) under the tongue every 5 (five) minutes as needed for chest pain. 25 tablet 3 unk   Scheduled: . amLODipine  5 mg Oral QHS  . aspirin EC  81 mg Oral QHS  . citalopram  20 mg Oral QHS  . ezetimibe  10 mg Oral QHS  . furosemide  20 mg Oral Daily  . gabapentin  300 mg Oral TID  . insulin aspart  0-9 Units Subcutaneous TID WC  . insulin glargine  60 Units Subcutaneous q morning - 10a  . insulin glargine  70 Units Subcutaneous QHS  . isosorbide mononitrate  30 mg Oral QHS  . loratadine  10 mg Oral Daily  . metoprolol tartrate  50 mg Oral QHS  . mometasone-formoterol  2 puff Inhalation BID  . pantoprazole  40 mg Oral Daily  . pravastatin  20 mg Oral Daily  . sodium chloride flush  3 mL Intravenous Q12H  . topiramate  25 mg Oral BID   Continuous: . sodium chloride    . cefTRIAXone (ROCEPHIN)  IV     YTK:ZSWFUX chloride, albuterol, nitroGLYCERIN, ondansetron **OR** ondansetron (ZOFRAN) IV, oxyCODONE, sodium chloride flush  Allergies  Allergen Reactions  . Divalproex Sodium Other (See Comments)    Causes anger  . Imdur [Isosorbide Dinitrate] Other (See Comments)    Severe headache (trying lower dose 07/05/18)  . Lipitor [Atorvastatin Calcium]     cramps  . Statins Other (See Comments)    Muscle aches and cramps  . Tramadol Other (See Comments)    Chest pain   .  Tricor [Fenofibrate] Other (See Comments)    Leg cramps  . Valproic Acid Other (See Comments)    "anger"      ROS:  A comprehensive review  of systems was negative except for: Cardiovascular: positive for SOB Gastrointestinal: positive for abdominal pain, nausea and vomiting  Blood pressure (!) 147/91, pulse 75, temperature 98.7 F (37.1 C), temperature source Oral, resp. rate 20, height 5' 9"  (1.753 m), weight 103.3 kg, SpO2 94 %. Physical Exam Vitals signs reviewed.  Constitutional:      Appearance: He is well-developed.  HENT:     Head: Normocephalic.  Eyes:     Extraocular Movements: Extraocular movements intact.  Cardiovascular:     Rate and Rhythm: Normal rate.  Pulmonary:     Effort: Pulmonary effort is normal.  Abdominal:     General: There is distension.     Palpations: Abdomen is soft.     Tenderness: There is abdominal tenderness in the right upper quadrant and epigastric area. There is no guarding or rebound.  Musculoskeletal:     Right lower leg: No edema.     Left lower leg: No edema.     Comments: Moves all extremities  Skin:    General: Skin is warm and dry.  Neurological:     General: No focal deficit present.     Mental Status: He is alert and oriented to person, place, and time.  Psychiatric:        Mood and Affect: Mood normal.        Behavior: Behavior normal.     Results: Results for orders placed or performed during the hospital encounter of 11/25/18 (from the past 48 hour(s))  Urinalysis, Routine w reflex microscopic     Status: Abnormal   Collection Time: 11/25/18  9:44 PM  Result Value Ref Range   Color, Urine AMBER (A) YELLOW    Comment: BIOCHEMICALS MAY BE AFFECTED BY COLOR   APPearance HAZY (A) CLEAR   Specific Gravity, Urine 1.028 1.005 - 1.030   pH 5.0 5.0 - 8.0   Glucose, UA NEGATIVE NEGATIVE mg/dL   Hgb urine dipstick SMALL (A) NEGATIVE   Bilirubin Urine NEGATIVE NEGATIVE   Ketones, ur NEGATIVE NEGATIVE mg/dL   Protein, ur 100 (A) NEGATIVE mg/dL   Nitrite NEGATIVE NEGATIVE   Leukocytes, UA NEGATIVE NEGATIVE   RBC / HPF 0-5 0 - 5 RBC/hpf   WBC, UA 0-5 0 - 5 WBC/hpf    Bacteria, UA NONE SEEN NONE SEEN   Squamous Epithelial / LPF 0-5 0 - 5   Mucus PRESENT     Comment: Performed at [facility], [address] [place], [place] [address]  Influenza panel by PCR (type A & B)     Status: None   Collection Time: 11/25/18  9:46 PM  Result Value Ref Range   Influenza A By PCR NEGATIVE NEGATIVE   Influenza B By PCR NEGATIVE NEGATIVE    Comment: (NOTE) The Xpert Xpress Flu assay is intended as an aid in the diagnosis of  influenza and should not be used as a sole basis for treatment.  This  assay is FDA approved for nasopharyngeal swab specimens only. Nasal  washings and aspirates are unacceptable for Xpert Xpress Flu testing. Performed at [facility], [address], [place], [place] [address]   Lipase, blood     Status: None   Collection Time: 11/25/18 10:00 PM  Result Value Ref Range   Lipase 22 11 - 51 U/L  Comment: Performed at [facility], [address], [place], [place] [address]  Comprehensive metabolic panel     Status: Abnormal   Collection Time: 11/25/18 10:00 PM  Result Value Ref Range   Sodium 132 (L) 135 - 145 mmol/L   Potassium 3.6 3.5 - 5.1 mmol/L   Chloride 100 98 - 111 mmol/L   CO2 24 22 - 32 mmol/L   Glucose, Bld 162 (H) 70 - 99 mg/dL   BUN 32 (H) 6 - 20 mg/dL   Creatinine, Ser 1.41 (H) 0.61 - 1.24 mg/dL   Calcium 8.6 (L) 8.9 - 10.3 mg/dL   Total Protein 8.4 (H) 6.5 - 8.1 g/dL   Albumin 3.8 3.5 - 5.0 g/dL   AST 30 15 - 41 U/L   ALT 33 0 - 44 U/L   Alkaline Phosphatase 44 38 - 126 U/L   Total Bilirubin 1.9 (H) 0.3 - 1.2 mg/dL   GFR calc non Af Amer 54 (L) >60 mL/min   GFR calc Af Amer >60 >60 mL/min   Anion gap 8 5 - 15    Comment: Performed at [facility], [address], [place], [place] [address]  CBC with Differential/Platelet     Status: Abnormal   Collection Time: 11/25/18 10:00 PM  Result Value Ref Range   WBC 8.5 4.0 - 10.5 K/uL   RBC 5.05 4.22 - 5.81 MIL/uL   Hemoglobin 15.4 13.0 - 17.0 g/dL   HCT 47.1 39.0 - 52.0  %   MCV 93.3 80.0 - 100.0 fL   MCH 30.5 26.0 - 34.0 pg   MCHC 32.7 30.0 - 36.0 g/dL   RDW 14.2 11.5 - 15.5 %   Platelets 93 (L) 150 - 400 K/uL    Comment: SPECIMEN CHECKED FOR CLOTS   nRBC 0.0 0.0 - 0.2 %   Neutrophils Relative % 79 %   Neutro Abs 6.6 1.7 - 7.7 K/uL   Lymphocytes Relative 13 %   Lymphs Abs 1.1 0.7 - 4.0 K/uL   Monocytes Relative 6 %   Monocytes Absolute 0.5 0.1 - 1.0 K/uL   Eosinophils Relative 0 %   Eosinophils Absolute 0.0 0.0 - 0.5 K/uL   Basophils Relative 0 %   Basophils Absolute 0.0 0.0 - 0.1 K/uL   Immature Granulocytes 2 %   Abs Immature Granulocytes 0.17 (H) 0.00 - 0.07 K/uL    Comment: Performed at [facility], [address] [place], [place] [address]  CBC     Status: Abnormal   Collection Time: 11/26/18  5:26 AM  Result Value Ref Range   WBC 7.5 4.0 - 10.5 K/uL   RBC 4.43 4.22 - 5.81 MIL/uL   Hemoglobin 13.6 13.0 - 17.0 g/dL   HCT 41.1 39.0 - 52.0 %   MCV 92.8 80.0 - 100.0 fL   MCH 30.7 26.0 - 34.0 pg   MCHC 33.1 30.0 - 36.0 g/dL   RDW 13.8 11.5 - 15.5 %   Platelets 82 (L) 150 - 400 K/uL    Comment: SPECIMEN CHECKED FOR CLOTS Immature Platelet Fraction may be clinically indicated, consider ordering this additional test [id number] CONSISTENT WITH PREVIOUS RESULT    nRBC 0.0 0.0 - 0.2 %    Comment: Performed at [facility], [address], [place], [place] [address]  Comprehensive metabolic panel     Status: Abnormal   Collection Time: 11/26/18  5:26 AM  Result Value Ref Range   Sodium 137 135 - 145 mmol/L   Potassium 3.7 3.5 -  5.1 mmol/L   Chloride 104 98 - 111 mmol/L   CO2 23 22 - 32 mmol/L   Glucose, Bld 154 (H) 70 - 99 mg/dL   BUN 30 (H) 6 - 20 mg/dL   Creatinine, Ser 1.28 (H) 0.61 - 1.24 mg/dL   Calcium 8.1 (L) 8.9 - 10.3 mg/dL   Total Protein 6.8 6.5 - 8.1 g/dL   Albumin 3.1 (L) 3.5 - 5.0 g/dL   AST 23 15 - 41 U/L   ALT 27 0 - 44 U/L   Alkaline Phosphatase 35 (L) 38 - 126 U/L   Total Bilirubin 1.7 (H) 0.3 - 1.2 mg/dL   GFR  calc non Af Amer >60 >60 mL/min   GFR calc Af Amer >60 >60 mL/min   Anion gap 10 5 - 15    Comment: Performed at [facility], [address], [place], [place] [address]  CBG monitoring, ED     Status: Abnormal   Collection Time: 11/26/18  6:19 AM  Result Value Ref Range   Glucose-Capillary 150 (H) 70 - 99 mg/dL   Comment 1 Notify RN    Comment 2 Document in Chart   Glucose, capillary     Status: Abnormal   Collection Time: 11/26/18 11:06 AM  Result Value Ref Range   Glucose-Capillary 123 (H) 70 - 99 mg/dL   Personally reviewed imaging- Gallbladder distended, no obvious stones, CBD normal size   Dg Chest 2 View  Result Date: 11/25/2018 CLINICAL DATA:  63 year old male with shortness of breath. Possible acute cholecystitis based on CTA chest yesterday. EXAM: CHEST - 2 VIEW COMPARISON:  11/24/2018 and earlier. FINDINGS: Low lung volumes with bibasilar atelectasis. Difficult to exclude small pleural effusions, which would be new since yesterday. No pneumothorax, pulmonary edema or consolidation. Normal cardiac size and mediastinal contours. Prior CABG. No acute osseous abnormality identified. Negative visible bowel gas pattern. IMPRESSION: Low lung volumes with bibasilar atelectasis. Questionable new small pleural effusions. Electronically Signed   By: Genevie Ann M.D.   On: 11/25/2018 20:38   Ct Angio Chest Pe W/cm &/or Wo Cm  Result Date: 11/24/2018 CLINICAL DATA:  Shortness of breath. EXAM: CT ANGIOGRAPHY CHEST WITH CONTRAST TECHNIQUE: Multidetector CT imaging of the chest was performed using the standard protocol during bolus administration of intravenous contrast. Multiplanar CT image reconstructions and MIPs were obtained to evaluate the vascular anatomy. CONTRAST:  77m ISOVUE-370 IOPAMIDOL (ISOVUE-370) INJECTION 76% COMPARISON:  08/13/2017 FINDINGS: Cardiovascular: The heart size is normal. No substantial pericardial effusion. Coronary artery calcification is evident. Status post CABG. No  thoracic aortic aneurysm. Assessment of pulmonary arteries is limited by respiratory motion. Some segmental and subsegmental branches have linear low-density in the lumen (see central right lung on 57/5) but this is felt to be more likely related to artifact from the breathing motion and pulmonary embolus. Mediastinum/Nodes: No mediastinal lymphadenopathy. There is no hilar lymphadenopathy. The esophagus has normal imaging features. There is no axillary lymphadenopathy. Lungs/Pleura: No suspicious pulmonary nodule or mass. No focal airspace consolidation. Dependent atelectasis noted in the lungs bilaterally. No pleural effusion. Upper Abdomen: Gallbladder is distended with ill-defined gallbladder wall and some apparent pericholecystic edema.Subtle nodularity of liver contour with large lateral segment left liver and caudate lobe is suggestive of cirrhosis. Musculoskeletal: No worrisome lytic or sclerotic osseous abnormality. Review of the MIP images confirms the above findings. IMPRESSION: 1. No CT evidence for acute pulmonary embolus although assessment of segmental and subsegmental pulmonary arteries is limited by respiratory motion. 2. No focal  airspace consolidation or pleural effusion. 3. Distended gallbladder with ill-defined gallbladder wall and some apparent pericholecystic edema. Acute cholecystitis is a concern. Abdominal ultrasound may prove helpful to further evaluate. 4. Liver morphology suggestive of cirrhosis. Electronically Signed   By: Misty Stanley M.D.   On: 11/24/2018 19:03   US Abdomen Limited  Result Date: 11/24/2018 CLINICAL DATA:  RIGHT upper quadrant pain for 2 days with nausea; history hypertension, CHF, diabetes mellitus EXAM: ULTRASOUND ABDOMEN LIMITED RIGHT UPPER QUADRANT COMPARISON:  CT abdomen and pelvis 11/23/2018 FINDINGS: Gallbladder: Distended without stones or wall thickening. No pericholecystic fluid or sonographic Murphy sign. Common bile duct: Diameter: 3 mm diameter,  normal Liver: Echogenic parenchyma, likely fatty infiltration though this can be seen with cirrhosis and certain infiltrative disorders. No focal hepatic mass or nodularity. Portal vein is patent on color Doppler imaging with normal direction of blood flow towards the liver. Trace perihepatic ascites. IMPRESSION: Question fatty infiltration of liver as above. Trace perihepatic ascites. Otherwise negative exam. Electronically Signed   By: Lavonia Dana M.D.   On: 11/24/2018 20:55    Assessment & Plan:  ALONSO GAPINSKI is a 63 y.o. male with what we suspect is cholecystitis in the setting of multiple other issues recently including some left side pain and cardiac workup that demonstrated severe but stable stenosis on catheterization. He is having pain with food and has tenderness on the right side. His ECHO demonstrated an EF 60% so it is unlikely to just be congestion or something cardiac related.  He also has an elevated T bili the past few days.    -HIDA on Monday, have confirmed with radiology that they will do the HIDA at 7AM. Will plan for surgery on Monday following the HIDA -Discussed with the family the reasoning for the HIDA to prove that the gallbladder is the cause of his pain, given his risk and coronary disease I want to be sure that he needs his gallbladder out before we subject him to that risk -While doing the laparoscopic cholecystectomy will plan for a liver biopsy for the fatty liver disease  -Discussed that he will nee GI follow up for the fatty liver disease and follow up closely due to the advancement to cirrhosis  -Discussed trying clears but if he is nauseated or vomits to not eat  -Discussed that he is at increased risk from cardiac standpoint due to his stenosis but that he is being medically managed.    All questions were answered to the satisfaction of the patient and family.  PLAN: I counseled the patient about the indication, risks and benefits of laparoscopic  cholecystectomy.  He understands there is a very small chance for bleeding, infection, injury to normal structures (including common bile duct), conversion to open surgery, persistent symptoms, evolution of postcholecystectomy diarrhea, need for secondary interventions, anesthesia reaction, cardiopulmonary issues and other risks not specifically detailed here. I described the expected recovery, the plan for follow-up and the restrictions during the recovery phase.  All questions were answered.   [name] 11/26/2018, 11:21 AM

## 2018-11-26 NOTE — ED Notes (Signed)
Dr Darrick Meigs at bedside,

## 2018-11-26 NOTE — ED Notes (Signed)
Pt and family updated on plan of care,  

## 2018-11-26 NOTE — Consult Note (Signed)
[facility] Consult  Reason for Consult: Cholecystitis ?  Referring Physician:  Dr. Roderic Palau  Chief Complaint    Abdominal Pain      [name] [name] is a 63 y.o. male.  HPI: [name] [name] is a 63 yo who has had issues with some recent RUQ pain and fevers that is concerning for cholecystitis. He was recently admitted to the hospital with left sided pain and SOB and had a workup including CTA chest which was negative for a PE but did show a distended gallbladder, cardiac catheterization that demonstrated severe stable coronary artery disease and did not get any intervention as medical management was felt to be sufficient.  He was also seen by Hematology during that admission for chronic thrombocytopenia that they feel is related to fatty liver and splenomegaly and not a primary bone marrow issue.   He was discharged from the hospital after the cardiac workup and Hematology consult, and started to have this RUQ pain that is worse with eating. He says that the pain is now constant and he has been having fevers.  An [place] was performed that demonstrated no signs of stones or wall thickening, but he continues to be tender in the RUQ. A HIDA scan has been ordered but this cannot be completed until Monday due to the non-emergent nature.     He has had some nausea and vomiting but is now reporting he is somewhat hungry.  He has been started on antibiotics.   ECHO 11/2018 - EF 55-60 %, LVH, no wall motion abnormality   Past Medical History:  Diagnosis Date  . Anxiety   . Arthritis   . Asthma   . Cataract    Right eye  . CHF (congestive heart failure) ([place])   . Chronic lower back pain   . Coronary atherosclerosis of native coronary artery    a. LAD & OM stenting;  b. 2007 Cath: nonobs dzs, patent stents;  c. 07/2012 neg MV, EF 61%.  d. 11/20/14: [place] s/p  PCI w/ DES to pLAD and DES to OM1   . Essential hypertension, benign   . GERD (gastroesophageal reflux disease)   . Hearing loss of  left ear   . History of gout   . History of hiatal hernia   . Hypercholesteremia   . Lumbar herniated disc   . MI, old   . Migraine   . OSA on CPAP   . 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 Thrombocytopenia ([place])   . TIA (transient ischemic attack) ~ 2010  . Type 2 diabetes mellitus ([place])     Past Surgical History:  Procedure Laterality Date  . ANTERIOR CERVICAL DECOMP/DISCECTOMY FUSION  1998   "C3-4"  . CARDIAC CATHETERIZATION  "several"  . CARDIAC CATHETERIZATION N/A 12/30/2015   Procedure: Left Heart Cath and Coronary Angiography;  Surgeon: [name], MD;  Location: [place] [facility];  Service: Cardiovascular;  Laterality: N/A;  . CARPAL TUNNEL RELEASE Bilateral 2005  . COLONOSCOPY    . CORONARY ANGIOPLASTY    . CORONARY ANGIOPLASTY WITH STENT PLACEMENT  2002; 2003; 11/20/2014   "I have 4 stents after today" (11/20/2014)  . CORONARY ARTERY BYPASS GRAFT N/A 01/09/2016   Procedure: CORONARY ARTERY BYPASS GRAFTING (CABG) X 3 UTILIZING RIGHT AND LEFT INTERNAL MAMMARY ARTERY AND ENDOSCOPICALLY HARVESTED SAPHENEOUS VEIN.;  Surgeon: Rexene Alberts, MD;  Location: [facility]  OR;  Service: Open Heart Surgery;  Laterality: N/A;  . ESOPHAGOGASTRODUODENOSCOPY    . KNEE SURGERY Left 02/2012   "scraped; open"  . LEFT HEART CATH AND CORS/GRAFTS ANGIOGRAPHY N/A 08/09/2017   Procedure: LEFT HEART CATH AND CORS/GRAFTS ANGIOGRAPHY;  Surgeon: [name], MD;  Location: [place] [facility];  Service: Cardiovascular;  Laterality: N/A;  . LEFT HEART CATH AND CORS/GRAFTS ANGIOGRAPHY N/A 11/22/2018   Procedure: LEFT HEART CATH AND CORS/GRAFTS ANGIOGRAPHY;  Surgeon: [name], [name], MD;  Location: [place] [facility];  Service: Cardiovascular;  Laterality: N/A;  . LEFT HEART CATHETERIZATION WITH CORONARY ANGIOGRAM N/A 07/20/2012   Procedure: LEFT HEART CATHETERIZATION WITH CORONARY ANGIOGRAM;  Surgeon: [name], MD;  Location: [place]  CATH LAB;  Service: Cardiovascular;  Laterality: N/A;  . LEFT HEART CATHETERIZATION WITH CORONARY ANGIOGRAM N/A 11/20/2014   Procedure: LEFT HEART CATHETERIZATION WITH CORONARY ANGIOGRAM;  Surgeon: [name], MD;  Location: [facility] CATH LAB;  Service: Cardiovascular;  Laterality: N/A;  . LEFT HEART CATHETERIZATION WITH CORONARY ANGIOGRAM N/A 11/26/2014   Procedure: LEFT HEART CATHETERIZATION WITH CORONARY ANGIOGRAM;  Surgeon: [name], MD;  Location: [facility] CATH LAB;  Service: Cardiovascular;  Laterality: N/A;  . NEUROPLASTY / TRANSPOSITION ULNAR NERVE AT ELBOW Right ~ 2012  . PERCUTANEOUS CORONARY ROTOBLATOR INTERVENTION (PCI-R)  11/20/2014   Procedure: PERCUTANEOUS CORONARY ROTOBLATOR INTERVENTION (PCI-R);  Surgeon: [name], MD;  Location: [facility] CATH LAB;  Service: Cardiovascular;;  . SHOULDER ARTHROSCOPY Left ~ 2011  . TEE WITHOUT CARDIOVERSION N/A 01/09/2016   Procedure: TRANSESOPHAGEAL ECHOCARDIOGRAM (TEE);  Surgeon: Rexene Alberts, MD;  Location: [place];  Service: Open Heart Surgery;  Laterality: N/A;    Family History  Problem Relation Age of Onset  . Coronary artery disease Father 36    Social History   Tobacco Use  . Smoking status: Never Smoker  . Smokeless tobacco: Never Used  Substance Use Topics  . Alcohol use: No  . Drug use: No    Medications:  I have reviewed the patient's current medications. Prior to Admission:  Medications Prior to Admission  Medication Sig Dispense Refill Last Dose  . albuterol (PROVENTIL HFA;VENTOLIN HFA) 108 (90 BASE) MCG/ACT inhaler Inhale 2 puffs into the lungs every 6 (six) hours as needed for wheezing or shortness of breath.   11/24/2018 at Unknown time  . amLODipine (NORVASC) 5 MG tablet Take 5 mg by mouth at bedtime.  4 11/24/2018 at Unknown time  . aspirin EC 81 MG tablet Take 81 mg by mouth at bedtime.    11/24/2018 at Unknown time  . cetirizine (ZYRTEC) 10 MG tablet Take 10 mg by mouth at bedtime.   11/24/2018 at Unknown time  .  citalopram (CELEXA) 20 MG tablet Take 20 mg by mouth at bedtime.    11/24/2018 at Unknown time  . ezetimibe (ZETIA) 10 MG tablet Take 10 mg by mouth at bedtime.   11/24/2018 at Unknown time  . fluticasone (FLONASE) 50 MCG/ACT nasal spray Place 1 spray into the nose 2 (two) times daily.    11/24/2018 at Unknown time  . Fluticasone-Salmeterol (ADVAIR) 250-50 MCG/DOSE AEPB Inhale 1 puff into the lungs every 12 (twelve) hours.   11/24/2018 at Unknown time  . furosemide (LASIX) 20 MG tablet Take 1 tablet (20 mg total) by mouth daily. 30 tablet 1 11/24/2018 at Unknown time  . gabapentin (NEURONTIN) 300 MG capsule Take 300 mg by mouth 3 (three) times daily.  2 11/24/2018 at Unknown time  .  insulin aspart (NOVOLOG FLEXPEN) 100 UNIT/ML FlexPen Inject 2-30 Units into the skin 3 (three) times daily as needed for high blood sugar (CBG >150). Per sliding scale written down at home    11/24/2018 at Unknown time  . isosorbide mononitrate (IMDUR) 30 MG 24 hr tablet Take 30 mg by mouth at bedtime.  4 11/24/2018 at Unknown time  . Melatonin 5 MG TABS Take 10 mg by mouth at bedtime.    11/24/2018 at Unknown time  . metFORMIN (GLUCOPHAGE) 1000 MG tablet Take 1 tablet (1,000 mg total) by mouth 2 (two) times daily. For the next 2 days as you did receive IV contrast, then resume on 11/26/2018 60 tablet 11 11/24/2018 at Unknown time  . metoprolol tartrate (LOPRESSOR) 50 MG tablet Take 50 mg by mouth at bedtime.   11/24/2018 at Unknown time  . omeprazole (PRILOSEC) 20 MG capsule Take 20 mg by mouth 2 (two) times daily before a meal.   11/24/2018 at Unknown time  . ondansetron (ZOFRAN ODT) 4 MG disintegrating tablet Take 1 tablet (4 mg total) by mouth every 8 (eight) hours as needed for up to 12 days for nausea or vomiting. 20 tablet 0 11/24/2018 at Unknown time  . oxyCODONE (ROXICODONE) 5 MG immediate release tablet Take 1 tablet (5 mg total) by mouth every 4 (four) hours as needed for severe pain. 12 tablet 0 11/24/2018 at Unknown time  .  pravastatin (PRAVACHOL) 20 MG tablet Take 1 tablet (20 mg total) by mouth daily. 10 tablet 0 11/24/2018 at Unknown time  . topiramate (TOPAMAX) 25 MG capsule Take 25 mg by mouth 2 (two) times daily.   11/24/2018 at Unknown time  . TRESIBA FLEXTOUCH 100 UNIT/ML SOPN FlexTouch Pen Inject 60-70 Units into the skin See admin instructions. 60 units in the morning and 70 units at bedtime  5 11/24/2018 at Unknown time  . nitroGLYCERIN (NITROSTAT) 0.4 MG SL tablet Place 1 tablet (0.4 mg total) under the tongue every 5 (five) minutes as needed for chest pain. 25 tablet 3 unk   Scheduled: . amLODipine  5 mg Oral QHS  . aspirin EC  81 mg Oral QHS  . citalopram  20 mg Oral QHS  . ezetimibe  10 mg Oral QHS  . furosemide  20 mg Oral Daily  . gabapentin  300 mg Oral TID  . insulin aspart  0-9 Units Subcutaneous TID WC  . insulin glargine  60 Units Subcutaneous q morning - 10a  . insulin glargine  70 Units Subcutaneous QHS  . isosorbide mononitrate  30 mg Oral QHS  . loratadine  10 mg Oral Daily  . metoprolol tartrate  50 mg Oral QHS  . mometasone-formoterol  2 puff Inhalation BID  . pantoprazole  40 mg Oral Daily  . pravastatin  20 mg Oral Daily  . sodium chloride flush  3 mL Intravenous Q12H  . topiramate  25 mg Oral BID   Continuous: . sodium chloride    . cefTRIAXone (ROCEPHIN)  IV     OQH:UTMLYY chloride, albuterol, nitroGLYCERIN, ondansetron **OR** ondansetron (ZOFRAN) IV, oxyCODONE, sodium chloride flush  Allergies  Allergen Reactions  . Divalproex Sodium Other (See Comments)    Causes anger  . Imdur [Isosorbide Dinitrate] Other (See Comments)    Severe headache (trying lower dose 07/05/18)  . Lipitor [Atorvastatin Calcium]     cramps  . Statins Other (See Comments)    Muscle aches and cramps  . Tramadol Other (See Comments)    Chest pain   .  Tricor [Fenofibrate] Other (See Comments)    Leg cramps  . Valproic Acid Other (See Comments)    "anger"      ROS:  A comprehensive review  of systems was negative except for: Cardiovascular: positive for SOB Gastrointestinal: positive for abdominal pain, nausea and vomiting  Blood pressure (!) 147/91, pulse 75, temperature 98.7 F (37.1 C), temperature source Oral, resp. rate 20, height 5' 9"  (1.753 m), weight 103.3 kg, SpO2 94 %. Physical Exam Vitals signs reviewed.  Constitutional:      Appearance: He is well-developed.  HENT:     Head: Normocephalic.  Eyes:     Extraocular Movements: Extraocular movements intact.  Cardiovascular:     Rate and Rhythm: Normal rate.  Pulmonary:     Effort: Pulmonary effort is normal.  Abdominal:     General: There is distension.     Palpations: Abdomen is soft.     Tenderness: There is abdominal tenderness in the right upper quadrant and epigastric area. There is no guarding or rebound.  Musculoskeletal:     Right lower leg: No edema.     Left lower leg: No edema.     Comments: Moves all extremities  Skin:    General: Skin is warm and dry.  Neurological:     General: No focal deficit present.     Mental Status: He is alert and oriented to person, place, and time.  Psychiatric:        Mood and Affect: Mood normal.        Behavior: Behavior normal.     Results: Results for orders placed or performed during the hospital encounter of 11/25/18 (from the past 48 hour(s))  Urinalysis, Routine w reflex microscopic     Status: Abnormal   Collection Time: 11/25/18  9:44 PM  Result Value Ref Range   Color, Urine AMBER (A) YELLOW    Comment: BIOCHEMICALS MAY BE AFFECTED BY COLOR   APPearance HAZY (A) CLEAR   Specific Gravity, Urine 1.028 1.005 - 1.030   pH 5.0 5.0 - 8.0   Glucose, UA NEGATIVE NEGATIVE mg/dL   Hgb urine dipstick SMALL (A) NEGATIVE   Bilirubin Urine NEGATIVE NEGATIVE   Ketones, ur NEGATIVE NEGATIVE mg/dL   Protein, ur 100 (A) NEGATIVE mg/dL   Nitrite NEGATIVE NEGATIVE   Leukocytes, UA NEGATIVE NEGATIVE   RBC / HPF 0-5 0 - 5 RBC/hpf   WBC, UA 0-5 0 - 5 WBC/hpf    Bacteria, UA NONE SEEN NONE SEEN   Squamous Epithelial / LPF 0-5 0 - 5   Mucus PRESENT     Comment: Performed at [facility], [address], [place], [place] [address]  Influenza panel by PCR (type A & B)     Status: None   Collection Time: 11/25/18  9:46 PM  Result Value Ref Range   Influenza A By PCR NEGATIVE NEGATIVE   Influenza B By PCR NEGATIVE NEGATIVE    Comment: (NOTE) The Xpert Xpress Flu assay is intended as an aid in the diagnosis of  influenza and should not be used as a sole basis for treatment.  This  assay is FDA approved for nasopharyngeal swab specimens only. Nasal  washings and aspirates are unacceptable for Xpert Xpress Flu testing. Performed at [facility], [address], [place], [place] [address]   Lipase, blood     Status: None   Collection Time: 11/25/18 10:00 PM  Result Value Ref Range   Lipase 22 11 - 51 U/L  Comment: Performed at [facility], [address] [place], [place] [address]  Comprehensive metabolic panel     Status: Abnormal   Collection Time: 11/25/18 10:00 PM  Result Value Ref Range   Sodium 132 (L) 135 - 145 mmol/L   Potassium 3.6 3.5 - 5.1 mmol/L   Chloride 100 98 - 111 mmol/L   CO2 24 22 - 32 mmol/L   Glucose, Bld 162 (H) 70 - 99 mg/dL   BUN 32 (H) 6 - 20 mg/dL   Creatinine, Ser 1.41 (H) 0.61 - 1.24 mg/dL   Calcium 8.6 (L) 8.9 - 10.3 mg/dL   Total Protein 8.4 (H) 6.5 - 8.1 g/dL   Albumin 3.8 3.5 - 5.0 g/dL   AST 30 15 - 41 U/L   ALT 33 0 - 44 U/L   Alkaline Phosphatase 44 38 - 126 U/L   Total Bilirubin 1.9 (H) 0.3 - 1.2 mg/dL   GFR calc non Af Amer 54 (L) >60 mL/min   GFR calc Af Amer >60 >60 mL/min   Anion gap 8 5 - 15    Comment: Performed at [facility], [address], [place], [place] [address]  CBC with Differential/Platelet     Status: Abnormal   Collection Time: 11/25/18 10:00 PM  Result Value Ref Range   WBC 8.5 4.0 - 10.5 K/uL   RBC 5.05 4.22 - 5.81 MIL/uL   Hemoglobin 15.4 13.0 - 17.0 g/dL   HCT 47.1 39.0 - 52.0  %   MCV 93.3 80.0 - 100.0 fL   MCH 30.5 26.0 - 34.0 pg   MCHC 32.7 30.0 - 36.0 g/dL   RDW 14.2 11.5 - 15.5 %   Platelets 93 (L) 150 - 400 K/uL    Comment: SPECIMEN CHECKED FOR CLOTS   nRBC 0.0 0.0 - 0.2 %   Neutrophils Relative % 79 %   Neutro Abs 6.6 1.7 - 7.7 K/uL   Lymphocytes Relative 13 %   Lymphs Abs 1.1 0.7 - 4.0 K/uL   Monocytes Relative 6 %   Monocytes Absolute 0.5 0.1 - 1.0 K/uL   Eosinophils Relative 0 %   Eosinophils Absolute 0.0 0.0 - 0.5 K/uL   Basophils Relative 0 %   Basophils Absolute 0.0 0.0 - 0.1 K/uL   Immature Granulocytes 2 %   Abs Immature Granulocytes 0.17 (H) 0.00 - 0.07 K/uL    Comment: Performed at [facility], [address] [place], [place] [address]  CBC     Status: Abnormal   Collection Time: 11/26/18  5:26 AM  Result Value Ref Range   WBC 7.5 4.0 - 10.5 K/uL   RBC 4.43 4.22 - 5.81 MIL/uL   Hemoglobin 13.6 13.0 - 17.0 g/dL   HCT 41.1 39.0 - 52.0 %   MCV 92.8 80.0 - 100.0 fL   MCH 30.7 26.0 - 34.0 pg   MCHC 33.1 30.0 - 36.0 g/dL   RDW 13.8 11.5 - 15.5 %   Platelets 82 (L) 150 - 400 K/uL    Comment: SPECIMEN CHECKED FOR CLOTS Immature Platelet Fraction may be clinically indicated, consider ordering this additional test [id number] CONSISTENT WITH PREVIOUS RESULT    nRBC 0.0 0.0 - 0.2 %    Comment: Performed at [facility], [address], [place], [place] [address]  Comprehensive metabolic panel     Status: Abnormal   Collection Time: 11/26/18  5:26 AM  Result Value Ref Range   Sodium 137 135 - 145 mmol/L   Potassium 3.7 3.5 -  5.1 mmol/L   Chloride 104 98 - 111 mmol/L   CO2 23 22 - 32 mmol/L   Glucose, Bld 154 (H) 70 - 99 mg/dL   BUN 30 (H) 6 - 20 mg/dL   Creatinine, Ser 1.28 (H) 0.61 - 1.24 mg/dL   Calcium 8.1 (L) 8.9 - 10.3 mg/dL   Total Protein 6.8 6.5 - 8.1 g/dL   Albumin 3.1 (L) 3.5 - 5.0 g/dL   AST 23 15 - 41 U/L   ALT 27 0 - 44 U/L   Alkaline Phosphatase 35 (L) 38 - 126 U/L   Total Bilirubin 1.7 (H) 0.3 - 1.2 mg/dL   GFR  calc non Af Amer >60 >60 mL/min   GFR calc Af Amer >60 >60 mL/min   Anion gap 10 5 - 15    Comment: Performed at [facility], [address] [place], [place] [address]  CBG monitoring, ED     Status: Abnormal   Collection Time: 11/26/18  6:19 AM  Result Value Ref Range   Glucose-Capillary 150 (H) 70 - 99 mg/dL   Comment 1 Notify RN    Comment 2 Document in Chart   Glucose, capillary     Status: Abnormal   Collection Time: 11/26/18 11:06 AM  Result Value Ref Range   Glucose-Capillary 123 (H) 70 - 99 mg/dL   Personally reviewed imaging- Gallbladder distended, no obvious stones, CBD normal size   Dg Chest 2 View  Result Date: 11/25/2018 CLINICAL DATA:  63 year old male with shortness of breath. Possible acute cholecystitis based on CTA chest yesterday. EXAM: CHEST - 2 VIEW COMPARISON:  11/24/2018 and earlier. FINDINGS: Low lung volumes with bibasilar atelectasis. Difficult to exclude small pleural effusions, which would be new since yesterday. No pneumothorax, pulmonary edema or consolidation. Normal cardiac size and mediastinal contours. Prior CABG. No acute osseous abnormality identified. Negative visible bowel gas pattern. IMPRESSION: Low lung volumes with bibasilar atelectasis. Questionable new small pleural effusions. Electronically Signed   By: Genevie Ann M.D.   On: 11/25/2018 20:38   Ct Angio Chest Pe W/cm &/or Wo Cm  Result Date: 11/24/2018 CLINICAL DATA:  Shortness of breath. EXAM: CT ANGIOGRAPHY CHEST WITH CONTRAST TECHNIQUE: Multidetector CT imaging of the chest was performed using the standard protocol during bolus administration of intravenous contrast. Multiplanar CT image reconstructions and MIPs were obtained to evaluate the vascular anatomy. CONTRAST:  33m ISOVUE-370 IOPAMIDOL (ISOVUE-370) INJECTION 76% COMPARISON:  08/13/2017 FINDINGS: Cardiovascular: The heart size is normal. No substantial pericardial effusion. Coronary artery calcification is evident. Status post CABG. No  thoracic aortic aneurysm. Assessment of pulmonary arteries is limited by respiratory motion. Some segmental and subsegmental branches have linear low-density in the lumen (see central right lung on 57/5) but this is felt to be more likely related to artifact from the breathing motion and pulmonary embolus. Mediastinum/Nodes: No mediastinal lymphadenopathy. There is no hilar lymphadenopathy. The esophagus has normal imaging features. There is no axillary lymphadenopathy. Lungs/Pleura: No suspicious pulmonary nodule or mass. No focal airspace consolidation. Dependent atelectasis noted in the lungs bilaterally. No pleural effusion. Upper Abdomen: Gallbladder is distended with ill-defined gallbladder wall and some apparent pericholecystic edema.Subtle nodularity of liver contour with large lateral segment left liver and caudate lobe is suggestive of cirrhosis. Musculoskeletal: No worrisome lytic or sclerotic osseous abnormality. Review of the MIP images confirms the above findings. IMPRESSION: 1. No CT evidence for acute pulmonary embolus although assessment of segmental and subsegmental pulmonary arteries is limited by respiratory motion. 2. No focal  airspace consolidation or pleural effusion. 3. Distended gallbladder with ill-defined gallbladder wall and some apparent pericholecystic edema. Acute cholecystitis is a concern. Abdominal ultrasound may prove helpful to further evaluate. 4. Liver morphology suggestive of cirrhosis. Electronically Signed   By: Misty Stanley M.D.   On: 11/24/2018 19:03   US Abdomen Limited  Result Date: 11/24/2018 CLINICAL DATA:  RIGHT upper quadrant pain for 2 days with nausea; history hypertension, CHF, diabetes mellitus EXAM: ULTRASOUND ABDOMEN LIMITED RIGHT UPPER QUADRANT COMPARISON:  CT abdomen and pelvis 11/23/2018 FINDINGS: Gallbladder: Distended without stones or wall thickening. No pericholecystic fluid or sonographic Murphy sign. Common bile duct: Diameter: 3 mm diameter,  normal Liver: Echogenic parenchyma, likely fatty infiltration though this can be seen with cirrhosis and certain infiltrative disorders. No focal hepatic mass or nodularity. Portal vein is patent on color Doppler imaging with normal direction of blood flow towards the liver. Trace perihepatic ascites. IMPRESSION: Question fatty infiltration of liver as above. Trace perihepatic ascites. Otherwise negative exam. Electronically Signed   By: Lavonia Dana M.D.   On: 11/24/2018 20:55    Assessment & Plan:  DECODA VAN is a 63 y.o. male with what we suspect is cholecystitis in the setting of multiple other issues recently including some left side pain and cardiac workup that demonstrated severe but stable stenosis on catheterization. He is having pain with food and has tenderness on the right side. His ECHO demonstrated an EF 60% so it is unlikely to just be congestion or something cardiac related.  He also has an elevated T bili the past few days.    -HIDA on Monday, have confirmed with radiology that they will do the HIDA at 7AM. Will plan for surgery on Monday following the HIDA -Discussed with the family the reasoning for the HIDA to prove that the gallbladder is the cause of his pain, given his risk and coronary disease I want to be sure that he needs his gallbladder out before we subject him to that risk -While doing the laparoscopic cholecystectomy will plan for a liver biopsy for the fatty liver disease  -Discussed that he will nee GI follow up for the fatty liver disease and follow up closely due to the advancement to cirrhosis  -Discussed trying clears but if he is nauseated or vomits to not eat  -Discussed that he is at increased risk from cardiac standpoint due to his stenosis but that he is being medically managed.    All questions were answered to the satisfaction of the patient and family.  PLAN: I counseled the patient about the indication, risks and benefits of laparoscopic  cholecystectomy.  He understands there is a very small chance for bleeding, infection, injury to normal structures (including common bile duct), conversion to open surgery, persistent symptoms, evolution of postcholecystectomy diarrhea, need for secondary interventions, anesthesia reaction, cardiopulmonary issues and other risks not specifically detailed here. I described the expected recovery, the plan for follow-up and the restrictions during the recovery phase.  All questions were answered.   [name] 11/26/2018, 11:21 AM

## 2018-11-27 DIAGNOSIS — R509 Fever, unspecified: Secondary | ICD-10-CM | POA: Diagnosis not present

## 2018-11-27 DIAGNOSIS — E1100 Type 2 diabetes mellitus with hyperosmolarity without nonketotic hyperglycemic-hyperosmolar coma (NKHHC): Secondary | ICD-10-CM

## 2018-11-27 DIAGNOSIS — I10 Essential (primary) hypertension: Secondary | ICD-10-CM | POA: Diagnosis not present

## 2018-11-27 DIAGNOSIS — E782 Mixed hyperlipidemia: Secondary | ICD-10-CM

## 2018-11-27 DIAGNOSIS — K7581 Nonalcoholic steatohepatitis (NASH): Secondary | ICD-10-CM | POA: Diagnosis not present

## 2018-11-27 DIAGNOSIS — R1011 Right upper quadrant pain: Secondary | ICD-10-CM | POA: Diagnosis not present

## 2018-11-27 LAB — GLUCOSE, CAPILLARY
Glucose-Capillary: 183 mg/dL — ABNORMAL HIGH (ref 70–99)
Glucose-Capillary: 165 mg/dL — ABNORMAL HIGH (ref 70–99)
Glucose-Capillary: 262 mg/dL — ABNORMAL HIGH (ref 70–99)
Glucose-Capillary: 193 mg/dL — ABNORMAL HIGH (ref 70–99)
Glucose-Capillary: 129 mg/dL — ABNORMAL HIGH (ref 70–99)

## 2018-11-27 LAB — COMPREHENSIVE METABOLIC PANEL
ALK PHOS: 41 U/L (ref 38–126)
ALT: 24 U/L (ref 0–44)
AST: 24 U/L (ref 15–41)
Albumin: 2.9 g/dL — ABNORMAL LOW (ref 3.5–5.0)
Anion gap: 9 (ref 5–15)
BUN: 33 mg/dL — ABNORMAL HIGH (ref 6–20)
CALCIUM: 8.2 mg/dL — AB (ref 8.9–10.3)
CO2: 26 mmol/L (ref 22–32)
Chloride: 100 mmol/L (ref 98–111)
Creatinine, Ser: 1.56 mg/dL — ABNORMAL HIGH (ref 0.61–1.24)
GFR calc Af Amer: 55 mL/min — ABNORMAL LOW (ref >60)
GFR calc non Af Amer: 48 mL/min — ABNORMAL LOW (ref >60)
Glucose, Bld: 195 mg/dL — ABNORMAL HIGH (ref 70–99)
Potassium: 3.5 mmol/L (ref 3.5–5.1)
Sodium: 135 mmol/L (ref 135–145)
TOTAL PROTEIN: 6.7 g/dL (ref 6.5–8.1)
Total Bilirubin: 1.4 mg/dL — ABNORMAL HIGH (ref 0.3–1.2)

## 2018-11-27 LAB — CBC
HCT: 39 % (ref 39.0–52.0)
Hemoglobin: 12.8 g/dL — ABNORMAL LOW (ref 13.0–17.0)
MCH: 30.8 pg (ref 26.0–34.0)
MCHC: 32.8 g/dL (ref 30.0–36.0)
MCV: 93.8 fL (ref 80.0–100.0)
Platelets: 95 10*3/uL — ABNORMAL LOW (ref 150–400)
RBC: 4.16 MIL/uL — ABNORMAL LOW (ref 4.22–5.81)
RDW: 13.6 % (ref 11.5–15.5)
WBC: 6.9 10*3/uL (ref 4.0–10.5)
nRBC: 0 % (ref 0.0–0.2)

## 2018-11-27 MED ORDER — CHLORHEXIDINE GLUCONATE CLOTH 2 % EX PADS
6.0000 | MEDICATED_PAD | Freq: Once | CUTANEOUS | Status: AC
  Administered 2018-11-27: 6 via TOPICAL

## 2018-11-27 MED ORDER — SODIUM CHLORIDE 0.9 % IV SOLN
2.0000 g | INTRAVENOUS | Status: AC
  Administered 2018-11-28: 2 g via INTRAVENOUS
  Filled 2018-11-27 (×2): qty 2

## 2018-11-27 MED ORDER — METRONIDAZOLE IN NACL 5-0.79 MG/ML-% IV SOLN
500.0000 mg | Freq: Three times a day (TID) | INTRAVENOUS | Status: DC
  Administered 2018-11-27 – 2018-11-28 (×4): 500 mg via INTRAVENOUS
  Filled 2018-11-27 (×4): qty 100

## 2018-11-27 MED ORDER — CHLORHEXIDINE GLUCONATE CLOTH 2 % EX PADS
6.0000 | MEDICATED_PAD | Freq: Once | CUTANEOUS | Status: AC
  Administered 2018-11-28: 6 via TOPICAL

## 2018-11-27 NOTE — Progress Notes (Signed)
[facility] Progress Note     Subjective: Doing fair. Tolerated some clears but had some pain. No nausea.  Objective: Vital signs in last 24 hours: Temp:  [97.6 F (36.4 C)-101.2 F (38.4 C)] 97.6 F (36.4 C) (01/19 1125) Pulse Rate:  [68-86] 76 (01/19 1200) Resp:  [7-15] 15 (01/19 1200) BP: (103-150)/(63-86) 148/84 (01/19 1200) SpO2:  [90 %-100 %] 100 % (01/19 1200) Last BM Date: 11/25/18  Intake/Output from previous day: 01/18 0701 - 01/19 0700 In: 572.4 [P.O.:480; I.V.:3; IV Piggyback:89.4] Out: 500 [Urine:500] Intake/Output this shift: Total I/O In: 189.3 [IV Piggyback:189.3] Out: -   General appearance: alert, cooperative and no distress Resp: normal work of breathing GI: soft, distended, tender in the right upper quadrant   Lab Results:  Recent Labs    11/26/18 0526 11/27/18 0442  WBC 7.5 6.9  HGB 13.6 12.8*  HCT 41.1 39.0  PLT 82* 95*   BMET Recent Labs    11/26/18 0526 11/27/18 0442  NA 137 135  K 3.7 3.5  CL 104 100  CO2 23 26  GLUCOSE 154* 195*  BUN 30* 33*  CREATININE 1.28* 1.56*  CALCIUM 8.1* 8.2*   PT/INR No results for input(s): LABPROT, INR in the last 72 hours.  Studies/Results: Dg Chest 2 View  Result Date: 11/25/2018 CLINICAL DATA:  63 year old male with shortness of breath. Possible acute cholecystitis based on CTA chest yesterday. EXAM: CHEST - 2 VIEW COMPARISON:  11/24/2018 and earlier. FINDINGS: Low lung volumes with bibasilar atelectasis. Difficult to exclude small pleural effusions, which would be new since yesterday. No pneumothorax, pulmonary edema or consolidation. Normal cardiac size and mediastinal contours. Prior CABG. No acute osseous abnormality identified. Negative visible bowel gas pattern. IMPRESSION: Low lung volumes with bibasilar atelectasis. Questionable new small pleural effusions. Electronically Signed   By: Genevie Ann M.D.   On: 11/25/2018 20:38    Anti-infectives: Anti-infectives (From  admission, onward)   Start     Dose/Rate Route Frequency Ordered Stop   11/27/18 1000  metroNIDAZOLE (FLAGYL) IVPB 500 mg     500 mg 100 mL/hr over 60 Minutes Intravenous Every 8 hours 11/27/18 0920     11/26/18 1100  cefTRIAXone (ROCEPHIN) 2 g in sodium chloride 0.9 % 100 mL IVPB     2 g 200 mL/hr over 30 Minutes Intravenous Every 24 hours 11/26/18 1012        Assessment/Plan: Mr. Serpe 63 yo with RUQ pain and some elevated LFTs concerning for possible cholecystitis. He has been cardiac catheterized recently with stable but severe stenosis and managed medically. He is getting HIDA in the AM to determine if it is his gallbladder and from there we will decide about pursuing surgery.   Consent obtained. NPO midnight    LOS: 0 days    [name] 11/27/2018

## 2018-11-27 NOTE — Progress Notes (Signed)
PROGRESS NOTE    [name] [name]  YYT:[id number] DOB: June 18, 1956 DOA: 11/25/2018 PCP: Terrial Rhodes, MD    Brief Narrative:  63 year old male who was recently in the hospital for left-sided chest pain underwent coronary angiography.  He was discharged to the hospital after catheterization showed stable coronary artery disease with recommendations for medical management.  He returned to the hospital with 2 days of persistent right upper quadrant pain, vomiting and fever.  Imaging of gallbladder has been inconclusive, but clinically there is concern for cholecystitis.  HIDA scan has been ordered and he is on intravenous antibiotics.  General surgery following.   Assessment & Plan:   Active Problems:   Mixed hyperlipidemia   Essential hypertension, benign   Type 2 diabetes mellitus (HCC)   Fever   Thrombocytopenia (HCC)   NASH (nonalcoholic steatohepatitis)   Acute diastolic CHF (congestive heart failure) (HCC)   Right upper quadrant abdominal pain   Acute respiratory failure with hypoxia ([place])   1. Right upper quadrant pain with fever.  Concern for underlying cholecystitis.  Imaging thus far including CT abdomen and abdominal ultrasound has shown distended gallbladder, but no evidence of stones or gallbladder wall thickening.  Clinically, he is tender to palpation in the right upper quadrant.  Concern for underlying cholecystitis.  Seen by general surgery and plans are for HIDA scan.  He is on intravenous antibiotics.  We will keep n.p.o. after midnight. 2. Diabetes type 2.  Metformin currently on hold.  He is continued on basal insulin.  Continue sliding scale insulin. 3. Coronary artery disease.  Status post CABG.  Had cardiac catheterization earlier this week.  Found to have severe three-vessel disease, but no significant changes from prior cath.  Recommendations were for continued medical management. 4. Obstructive sleep apnea. 5. Hypertension.  Blood pressure currently stable.   Continue on metoprolol. 6. Thrombocytopenia.  Felt to be secondary to underlying liver disease.  Currently stable.  Continue to monitor. [place].  Patient noted to have a history of fatty liver.  If plan is for cholecystectomy, general surgery will also plan a liver biopsy. 8. Acute kidney injury.  Baseline creatinine 0.8.  Creatinine has trended up to 1.5.  He had received a dose of intravenous Lasix on admission.  Does not appear to be particularly volume overloaded.  Continue to monitor renal function.   DVT prophylaxis: SCDs Code Status: Full code Family Communication: Discussed with patient and wife at the bedside Disposition Plan: Discharge home once improved   Consultants:   General surgery  Procedures:     Antimicrobials:   Ceftriaxone 1/18 >  Flagyl 1/19 >   Subjective: Still has right-sided abdominal pain, but tolerable with pain medications.  Had another fever overnight.  Tolerating clear liquids.  Objective: Vitals:   11/27/18 0600 11/27/18 0759 11/27/18 0824 11/27/18 1125  BP: 118/76     Pulse: 68 68  72  Resp: 10 11  (!) 8  Temp: 98 F (36.7 C) 98.1 F (36.7 C)  97.6 F (36.4 C)  TempSrc:  Axillary  Oral  SpO2: 97% 97% 98% 99%  Weight:      Height:        Intake/Output Summary (Last 24 hours) at 11/27/2018 1243 Last data filed at 11/27/2018 0400 Gross per 24 hour  Intake 572.44 ml  Output 500 ml  Net 72.44 ml   Filed Weights   11/25/18 1926 11/26/18 0747  Weight: 107.7 kg 103.3 kg    Examination:  General  exam: Appears calm and comfortable  Respiratory system: Clear to auscultation. Respiratory effort normal. Cardiovascular system: S1 & S2 heard, RRR. No JVD, murmurs, rubs, gallops or clicks. No pedal edema. Gastrointestinal system: Abdomen is nondistended, soft and tender in right upper quadrant. No organomegaly or masses felt. Normal bowel sounds heard. Central nervous system: Alert and oriented. No focal neurological  deficits. Extremities: Symmetric 5 x 5 power. Skin: No rashes, lesions or ulcers Psychiatry: Judgement and insight appear normal. Mood & affect appropriate.     Data Reviewed: I have personally reviewed following labs and imaging studies  CBC: Recent Labs  Lab 11/20/18 1520  11/22/18 0641  11/23/18 2058 11/24/18 1323 11/25/18 2200 11/26/18 0526 11/27/18 0442  WBC 2.6*   < > 2.8*   < > 5.6 10.3 8.5 7.5 6.9  NEUTROABS 1.3*  --  1.5*  --   --   --  6.6  --   --   HGB 14.2   < > 13.1   < > 16.4 16.4 15.4 13.6 12.8*  HCT 41.3   < > 38.6*   < > 47.4 48.9 47.1 41.1 39.0  MCV 89.4   < > 89.4   < > 88.6 90.7 93.3 92.8 93.8  PLT 88*   < > 77*   < > 76* 101* 93* 82* 95*   < > = values in this interval not displayed.   Basic Metabolic Panel: Recent Labs  Lab 11/23/18 2058 11/24/18 1323 11/25/18 2200 11/26/18 0526 11/27/18 0442  NA 136 136 132* 137 135  K 3.6 4.4 3.6 3.7 3.5  CL 105 100 100 104 100  CO2 19* 22 24 23 26   GLUCOSE 171* 230* 162* 154* 195*  BUN 17 15 32* 30* 33*  CREATININE 0.81 1.32* 1.41* 1.28* 1.56*  CALCIUM 9.5 9.3 8.6* 8.1* 8.2*   GFR: Estimated Creatinine Clearance: 59.6 mL/min (A) (by C-G formula based on SCr of 1.56 mg/dL (H)). Liver Function Tests: Recent Labs  Lab 11/23/18 2058 11/24/18 1323 11/25/18 2200 11/26/18 0526 11/27/18 0442  AST 38 40 30 23 24   ALT 37 33 33 27 24  ALKPHOS 58 49 44 35* 41  BILITOT 0.8 1.3* 1.9* 1.7* 1.4*  PROT 8.4* 7.9 8.4* 6.8 6.7  ALBUMIN 4.1 3.8 3.8 3.1* 2.9*   Recent Labs  Lab 11/23/18 2058 11/24/18 1323 11/25/18 2200  LIPASE 36 26 22   No results for input(s): AMMONIA in the last 168 hours. Coagulation Profile: Recent Labs  Lab 11/20/18 1520  INR 1.06   Cardiac Enzymes: Recent Labs  Lab 11/20/18 1520 11/20/18 2240 11/21/18 0250 11/23/18 2058  TROPONINI <0.03 <0.03 <0.03 <0.03   BNP (last 3 results) No results for input(s): PROBNP in the last 8760 hours. HbA1C: Recent Labs    11/26/18 0526   HGBA1C 7.2*   CBG: Recent Labs  Lab 11/26/18 1620 11/26/18 2327 11/27/18 0504 11/27/18 0910 11/27/18 1124  GLUCAP 128* 151* 183* 165* 262*   Lipid Profile: No results for input(s): CHOL, HDL, LDLCALC, TRIG, CHOLHDL, LDLDIRECT in the last 72 hours. Thyroid Function Tests: No results for input(s): TSH, T4TOTAL, FREET4, T3FREE, THYROIDAB in the last 72 hours. Anemia Panel: No results for input(s): VITAMINB12, FOLATE, FERRITIN, TIBC, IRON, RETICCTPCT in the last 72 hours. Sepsis Labs: No results for input(s): PROCALCITON, LATICACIDVEN in the last 168 hours.  Recent Results (from the past 240 hour(s))  MRSA PCR Screening     Status: None   Collection Time: 11/26/18  7:51 AM  Result Value Ref Range Status   MRSA by PCR NEGATIVE NEGATIVE Final    Comment:        The GeneXpert MRSA Assay (FDA approved for NASAL specimens only), is one component of a comprehensive MRSA colonization surveillance program. It is not intended to diagnose MRSA infection nor to guide or monitor treatment for MRSA infections. Performed at [facility], [address] [place], [place] [address]          Radiology Studies: Dg Chest 2 View  Result Date: 11/25/2018 CLINICAL DATA:  63 year old male with shortness of breath. Possible acute cholecystitis based on CTA chest yesterday. EXAM: CHEST - 2 VIEW COMPARISON:  11/24/2018 and earlier. FINDINGS: Low lung volumes with bibasilar atelectasis. Difficult to exclude small pleural effusions, which would be new since yesterday. No pneumothorax, pulmonary edema or consolidation. Normal cardiac size and mediastinal contours. Prior CABG. No acute osseous abnormality identified. Negative visible bowel gas pattern. IMPRESSION: Low lung volumes with bibasilar atelectasis. Questionable new small pleural effusions. Electronically Signed   By: Genevie Ann M.D.   On: 11/25/2018 20:38        Scheduled Meds: . amLODipine  5 mg Oral QHS  . aspirin EC  81 mg Oral QHS   . citalopram  20 mg Oral QHS  . ezetimibe  10 mg Oral QHS  . gabapentin  300 mg Oral TID  . insulin aspart  0-9 Units Subcutaneous TID WC  . insulin glargine  60 Units Subcutaneous q morning - 10a  . insulin glargine  70 Units Subcutaneous QHS  . isosorbide mononitrate  30 mg Oral QHS  . loratadine  10 mg Oral Daily  . metoprolol tartrate  50 mg Oral QHS  . mometasone-formoterol  2 puff Inhalation BID  . pantoprazole  40 mg Oral Daily  . pravastatin  20 mg Oral Daily  . sodium chloride flush  3 mL Intravenous Q12H  . topiramate  25 mg Oral BID   Continuous Infusions: . sodium chloride    . cefTRIAXone (ROCEPHIN)  IV 2 g (11/27/18 1240)  . metronidazole 500 mg (11/27/18 0946)     LOS: 0 days    Time spent: 36mns    JKathie Dike MD [facility]   If 7PM-7AM, please contact night-coverage [email or web address]  11/27/2018, 12:43 PM

## 2018-11-28 ENCOUNTER — Observation Stay (HOSPITAL_COMMUNITY): Payer: Medicare Other

## 2018-11-28 ENCOUNTER — Inpatient Hospital Stay (HOSPITAL_COMMUNITY): Payer: Medicare Other | Admitting: Anesthesiology

## 2018-11-28 ENCOUNTER — Encounter (HOSPITAL_COMMUNITY): Payer: Self-pay

## 2018-11-28 ENCOUNTER — Encounter (HOSPITAL_COMMUNITY)
Admission: EM | Disposition: A | Discharge status: Home / Self Care | Payer: Self-pay | Source: Home / Self Care | Attending: Internal Medicine

## 2018-11-28 DIAGNOSIS — D6959 Other secondary thrombocytopenia: Secondary | ICD-10-CM | POA: Diagnosis present

## 2018-11-28 DIAGNOSIS — Z981 Arthrodesis status: Secondary | ICD-10-CM | POA: Diagnosis not present

## 2018-11-28 DIAGNOSIS — R1011 Right upper quadrant pain: Secondary | ICD-10-CM | POA: Diagnosis present

## 2018-11-28 DIAGNOSIS — K7460 Unspecified cirrhosis of liver: Secondary | ICD-10-CM | POA: Diagnosis present

## 2018-11-28 DIAGNOSIS — Z955 Presence of coronary angioplasty implant and graft: Secondary | ICD-10-CM | POA: Diagnosis not present

## 2018-11-28 DIAGNOSIS — Z951 Presence of aortocoronary bypass graft: Secondary | ICD-10-CM | POA: Diagnosis not present

## 2018-11-28 DIAGNOSIS — G4733 Obstructive sleep apnea (adult) (pediatric): Secondary | ICD-10-CM | POA: Diagnosis present

## 2018-11-28 DIAGNOSIS — K219 Gastro-esophageal reflux disease without esophagitis: Secondary | ICD-10-CM | POA: Diagnosis present

## 2018-11-28 DIAGNOSIS — K8000 Calculus of gallbladder with acute cholecystitis without obstruction: Principal | ICD-10-CM

## 2018-11-28 DIAGNOSIS — D696 Thrombocytopenia, unspecified: Secondary | ICD-10-CM

## 2018-11-28 DIAGNOSIS — M5126 Other intervertebral disc displacement, lumbar region: Secondary | ICD-10-CM | POA: Diagnosis present

## 2018-11-28 DIAGNOSIS — G8929 Other chronic pain: Secondary | ICD-10-CM | POA: Diagnosis present

## 2018-11-28 DIAGNOSIS — K821 Hydrops of gallbladder: Secondary | ICD-10-CM | POA: Diagnosis present

## 2018-11-28 DIAGNOSIS — R161 Splenomegaly, not elsewhere classified: Secondary | ICD-10-CM | POA: Diagnosis present

## 2018-11-28 DIAGNOSIS — J9601 Acute respiratory failure with hypoxia: Secondary | ICD-10-CM

## 2018-11-28 DIAGNOSIS — E119 Type 2 diabetes mellitus without complications: Secondary | ICD-10-CM | POA: Diagnosis present

## 2018-11-28 DIAGNOSIS — E782 Mixed hyperlipidemia: Secondary | ICD-10-CM | POA: Diagnosis present

## 2018-11-28 DIAGNOSIS — N179 Acute kidney failure, unspecified: Secondary | ICD-10-CM | POA: Diagnosis not present

## 2018-11-28 DIAGNOSIS — J45909 Unspecified asthma, uncomplicated: Secondary | ICD-10-CM | POA: Diagnosis present

## 2018-11-28 DIAGNOSIS — K760 Fatty (change of) liver, not elsewhere classified: Secondary | ICD-10-CM

## 2018-11-28 DIAGNOSIS — I2510 Atherosclerotic heart disease of native coronary artery without angina pectoris: Secondary | ICD-10-CM | POA: Diagnosis present

## 2018-11-28 DIAGNOSIS — I110 Hypertensive heart disease with heart failure: Secondary | ICD-10-CM | POA: Diagnosis present

## 2018-11-28 DIAGNOSIS — K82A1 Gangrene of gallbladder in cholecystitis: Secondary | ICD-10-CM | POA: Diagnosis present

## 2018-11-28 DIAGNOSIS — I252 Old myocardial infarction: Secondary | ICD-10-CM | POA: Diagnosis not present

## 2018-11-28 DIAGNOSIS — I10 Essential (primary) hypertension: Secondary | ICD-10-CM | POA: Diagnosis not present

## 2018-11-28 DIAGNOSIS — I5031 Acute diastolic (congestive) heart failure: Secondary | ICD-10-CM | POA: Diagnosis present

## 2018-11-28 DIAGNOSIS — H9192 Unspecified hearing loss, left ear: Secondary | ICD-10-CM | POA: Diagnosis present

## 2018-11-28 DIAGNOSIS — K7581 Nonalcoholic steatohepatitis (NASH): Secondary | ICD-10-CM | POA: Diagnosis present

## 2018-11-28 HISTORY — PX: CHOLECYSTECTOMY: SHX55

## 2018-11-28 LAB — CBC
HEMATOCRIT: 36.8 % — AB (ref 39.0–52.0)
Hemoglobin: 12 g/dL — ABNORMAL LOW (ref 13.0–17.0)
MCH: 30.5 pg (ref 26.0–34.0)
MCHC: 32.6 g/dL (ref 30.0–36.0)
MCV: 93.6 fL (ref 80.0–100.0)
Platelets: 96 10*3/uL — ABNORMAL LOW (ref 150–400)
RBC: 3.93 MIL/uL — ABNORMAL LOW (ref 4.22–5.81)
RDW: 13.6 % (ref 11.5–15.5)
WBC: 4.8 10*3/uL (ref 4.0–10.5)
nRBC: 0 % (ref 0.0–0.2)

## 2018-11-28 LAB — COMPREHENSIVE METABOLIC PANEL
ALT: 22 U/L (ref 0–44)
AST: 23 U/L (ref 15–41)
Albumin: 2.8 g/dL — ABNORMAL LOW (ref 3.5–5.0)
Alkaline Phosphatase: 47 U/L (ref 38–126)
Anion gap: 11 (ref 5–15)
BUN: 25 mg/dL — ABNORMAL HIGH (ref 6–20)
CO2: 24 mmol/L (ref 22–32)
Calcium: 8.2 mg/dL — ABNORMAL LOW (ref 8.9–10.3)
Chloride: 101 mmol/L (ref 98–111)
Creatinine, Ser: 1.33 mg/dL — ABNORMAL HIGH (ref 0.61–1.24)
GFR calc Af Amer: >60 mL/min (ref >60)
GFR calc non Af Amer: 58 mL/min — ABNORMAL LOW (ref >60)
Glucose, Bld: 105 mg/dL — ABNORMAL HIGH (ref 70–99)
Potassium: 3.4 mmol/L — ABNORMAL LOW (ref 3.5–5.1)
Sodium: 136 mmol/L (ref 135–145)
TOTAL PROTEIN: 6.8 g/dL (ref 6.5–8.1)
Total Bilirubin: 1.1 mg/dL (ref 0.3–1.2)

## 2018-11-28 LAB — GLUCOSE, CAPILLARY
GLUCOSE-CAPILLARY: 265 mg/dL — AB (ref 70–99)
Glucose-Capillary: 87 mg/dL (ref 70–99)
Glucose-Capillary: 94 mg/dL (ref 70–99)
Glucose-Capillary: 118 mg/dL — ABNORMAL HIGH (ref 70–99)

## 2018-11-28 SURGERY — LAPAROSCOPIC CHOLECYSTECTOMY
Anesthesia: General | Site: Abdomen

## 2018-11-28 MED ORDER — INSULIN ASPART 100 UNIT/ML ~~LOC~~ SOLN
0.0000 [IU] | Freq: Every day | SUBCUTANEOUS | Status: DC
  Administered 2018-11-28 – 2018-11-29 (×2): 3 [IU] via SUBCUTANEOUS

## 2018-11-28 MED ORDER — INSULIN ASPART 100 UNIT/ML ~~LOC~~ SOLN: 0.0000 [IU] | SUBCUTANEOUS | Status: DC

## 2018-11-28 MED ORDER — ONDANSETRON HCL 4 MG/2ML IJ SOLN
INTRAMUSCULAR | Status: AC
  Filled 2018-11-28: qty 2

## 2018-11-28 MED ORDER — HEMOSTATIC AGENTS (NO CHARGE) OPTIME
TOPICAL | Status: DC | PRN
  Administered 2018-11-28: 1

## 2018-11-28 MED ORDER — BUPIVACAINE HCL (PF) 0.5 % IJ SOLN
INTRAMUSCULAR | Status: AC
  Filled 2018-11-28: qty 30

## 2018-11-28 MED ORDER — PROPOFOL 10 MG/ML IV BOLUS
INTRAVENOUS | Status: DC | PRN
  Administered 2018-11-28: 160 mg via INTRAVENOUS

## 2018-11-28 MED ORDER — LIDOCAINE 2% (20 MG/ML) 5 ML SYRINGE
INTRAMUSCULAR | Status: DC | PRN
  Administered 2018-11-28: 40 mg via INTRAVENOUS

## 2018-11-28 MED ORDER — HYDROMORPHONE HCL 1 MG/ML IJ SOLN
INTRAMUSCULAR | Status: AC
  Filled 2018-11-28: qty 0.5

## 2018-11-28 MED ORDER — INSULIN GLARGINE 100 UNIT/ML ~~LOC~~ SOLN
30.0000 [IU] | Freq: Once | SUBCUTANEOUS | Status: DC
  Filled 2018-11-28: qty 0.3

## 2018-11-28 MED ORDER — SUCCINYLCHOLINE CHLORIDE 200 MG/10ML IV SOSY
PREFILLED_SYRINGE | INTRAVENOUS | Status: AC
  Filled 2018-11-28: qty 10

## 2018-11-28 MED ORDER — SEVOFLURANE IN SOLN
RESPIRATORY_TRACT | Status: AC
  Filled 2018-11-28: qty 250

## 2018-11-28 MED ORDER — LACTATED RINGERS IV SOLN
INTRAVENOUS | Status: DC
  Administered 2018-11-28: 11:00:00 via INTRAVENOUS

## 2018-11-28 MED ORDER — HYDROCODONE-ACETAMINOPHEN 7.5-325 MG PO TABS: 1.0000 | ORAL_TABLET | Freq: Once | ORAL | Status: DC | PRN

## 2018-11-28 MED ORDER — INSULIN ASPART 100 UNIT/ML ~~LOC~~ SOLN
0.0000 [IU] | Freq: Three times a day (TID) | SUBCUTANEOUS | Status: DC
  Administered 2018-11-29: 5 [IU] via SUBCUTANEOUS
  Administered 2018-11-29: 8 [IU] via SUBCUTANEOUS
  Administered 2018-11-29: 5 [IU] via SUBCUTANEOUS
  Administered 2018-11-30: 2 [IU] via SUBCUTANEOUS
  Administered 2018-11-30 (×2): 3 [IU] via SUBCUTANEOUS

## 2018-11-28 MED ORDER — SUGAMMADEX SODIUM 200 MG/2ML IV SOLN
INTRAVENOUS | Status: AC
  Filled 2018-11-28: qty 2

## 2018-11-28 MED ORDER — BUPIVACAINE LIPOSOME 1.3 % IJ SUSP
INTRAMUSCULAR | Status: AC
  Filled 2018-11-28: qty 20

## 2018-11-28 MED ORDER — DEXAMETHASONE SODIUM PHOSPHATE 10 MG/ML IJ SOLN
INTRAMUSCULAR | Status: AC
  Filled 2018-11-28: qty 1

## 2018-11-28 MED ORDER — PROPOFOL 10 MG/ML IV BOLUS
INTRAVENOUS | Status: AC
  Filled 2018-11-28: qty 20

## 2018-11-28 MED ORDER — MEPERIDINE HCL 50 MG/ML IJ SOLN: 6.2500 mg | INTRAMUSCULAR | Status: DC | PRN

## 2018-11-28 MED ORDER — DEXTROSE-NACL 5-0.9 % IV SOLN
INTRAVENOUS | Status: DC
  Administered 2018-11-28: 19:00:00 via INTRAVENOUS

## 2018-11-28 MED ORDER — ROCURONIUM BROMIDE 50 MG/5ML IV SOSY
PREFILLED_SYRINGE | INTRAVENOUS | Status: DC | PRN
  Administered 2018-11-28: 10 mg via INTRAVENOUS
  Administered 2018-11-28: 40 mg via INTRAVENOUS

## 2018-11-28 MED ORDER — BUPIVACAINE HCL (PF) 0.5 % IJ SOLN
INTRAMUSCULAR | Status: DC | PRN
  Administered 2018-11-28: 10 mL

## 2018-11-28 MED ORDER — MORPHINE SULFATE (PF) 4 MG/ML IV SOLN
4.0000 mg | Freq: Once | INTRAVENOUS | Status: AC
  Administered 2018-11-28: 4 mg via INTRAVENOUS

## 2018-11-28 MED ORDER — FENTANYL CITRATE (PF) 250 MCG/5ML IJ SOLN
INTRAMUSCULAR | Status: AC
  Filled 2018-11-28: qty 5

## 2018-11-28 MED ORDER — MORPHINE SULFATE (PF) 4 MG/ML IV SOLN
INTRAVENOUS | Status: AC
  Filled 2018-11-28: qty 1

## 2018-11-28 MED ORDER — SUCCINYLCHOLINE CHLORIDE 20 MG/ML IJ SOLN
INTRAMUSCULAR | Status: DC | PRN
  Administered 2018-11-28: 120 mg via INTRAVENOUS

## 2018-11-28 MED ORDER — ONDANSETRON HCL 4 MG/2ML IJ SOLN
INTRAMUSCULAR | Status: DC | PRN
  Administered 2018-11-28: 4 mg via INTRAVENOUS

## 2018-11-28 MED ORDER — GLYCOPYRROLATE PF 0.2 MG/ML IJ SOSY
PREFILLED_SYRINGE | INTRAMUSCULAR | Status: AC
  Filled 2018-11-28: qty 1

## 2018-11-28 MED ORDER — ROCURONIUM BROMIDE 10 MG/ML (PF) SYRINGE
PREFILLED_SYRINGE | INTRAVENOUS | Status: AC
  Filled 2018-11-28: qty 10

## 2018-11-28 MED ORDER — KETOROLAC TROMETHAMINE 30 MG/ML IJ SOLN: 30.0000 mg | Freq: Once | INTRAMUSCULAR | Status: DC | PRN

## 2018-11-28 MED ORDER — DEXAMETHASONE SODIUM PHOSPHATE 4 MG/ML IJ SOLN
INTRAMUSCULAR | Status: DC | PRN
  Administered 2018-11-28: 10 mg via INTRAVENOUS

## 2018-11-28 MED ORDER — SUGAMMADEX SODIUM 200 MG/2ML IV SOLN
INTRAVENOUS | Status: DC | PRN
  Administered 2018-11-28: 206.6 mg via INTRAVENOUS

## 2018-11-28 MED ORDER — FENTANYL CITRATE (PF) 100 MCG/2ML IJ SOLN
INTRAMUSCULAR | Status: AC
  Filled 2018-11-28: qty 4

## 2018-11-28 MED ORDER — ONDANSETRON HCL 4 MG/2ML IJ SOLN: 4.0000 mg | Freq: Once | INTRAMUSCULAR | Status: DC | PRN

## 2018-11-28 MED ORDER — LIDOCAINE 2% (20 MG/ML) 5 ML SYRINGE
INTRAMUSCULAR | Status: AC
  Filled 2018-11-28: qty 5

## 2018-11-28 MED ORDER — 0.9 % SODIUM CHLORIDE (POUR BTL) OPTIME
TOPICAL | Status: DC | PRN
  Administered 2018-11-28: 1000 mL

## 2018-11-28 MED ORDER — TECHNETIUM TC 99M MEBROFENIN IV KIT
5.0000 | PACK | Freq: Once | INTRAVENOUS | Status: AC | PRN
  Administered 2018-11-28: 4.75 via INTRAVENOUS

## 2018-11-28 MED ORDER — OXYCODONE HCL 5 MG PO TABS
5.0000 mg | ORAL_TABLET | ORAL | Status: DC | PRN
  Administered 2018-11-28 – 2018-11-29 (×4): 5 mg via ORAL
  Administered 2018-11-29: 10 mg via ORAL
  Filled 2018-11-28: qty 1
  Filled 2018-11-28: qty 2
  Filled 2018-11-28 (×4): qty 1

## 2018-11-28 MED ORDER — ARTIFICIAL TEARS OPHTHALMIC OINT
TOPICAL_OINTMENT | OPHTHALMIC | Status: AC
  Filled 2018-11-28: qty 7

## 2018-11-28 MED ORDER — HYDROMORPHONE HCL 1 MG/ML IJ SOLN
0.2500 mg | INTRAMUSCULAR | Status: DC | PRN
  Administered 2018-11-28: 0.5 mg via INTRAVENOUS

## 2018-11-28 MED ORDER — FENTANYL CITRATE (PF) 100 MCG/2ML IJ SOLN
INTRAMUSCULAR | Status: DC | PRN
  Administered 2018-11-28 (×3): 50 ug via INTRAVENOUS

## 2018-11-28 MED ORDER — MORPHINE SULFATE (PF) 2 MG/ML IV SOLN
2.0000 mg | INTRAVENOUS | Status: DC | PRN
  Administered 2018-11-29: 2 mg via INTRAVENOUS
  Filled 2018-11-28: qty 1

## 2018-11-28 SURGICAL SUPPLY — 52 items
ADH SKN CLS APL DERMABOND .7 (GAUZE/BANDAGES/DRESSINGS) ×1
APL SRG 38 LTWT LNG FL B (MISCELLANEOUS) ×1
APPLICATOR ARISTA FLEXITIP XL (MISCELLANEOUS) ×1 IMPLANT
APPLIER CLIP ROT 10 11.4 M/L (STAPLE) ×2
APR CLP MED LRG 11.4X10 (STAPLE) ×1
BAG RETRIEVAL 10 (BASKET) ×1
BLADE SURG 15 STRL LF DISP TIS (BLADE) ×1 IMPLANT
BLADE SURG 15 STRL SS (BLADE) ×2
CHLORAPREP W/TINT 26ML (MISCELLANEOUS) ×2 IMPLANT
CLIP APPLIE ROT 10 11.4 M/L (STAPLE) ×1 IMPLANT
CLOTH BEACON ORANGE TIMEOUT ST (SAFETY) ×2 IMPLANT
COVER LIGHT HANDLE STERIS (MISCELLANEOUS) ×4 IMPLANT
DECANTER SPIKE VIAL GLASS SM (MISCELLANEOUS) ×2 IMPLANT
DERMABOND ADVANCED (GAUZE/BANDAGES/DRESSINGS) ×1
DERMABOND ADVANCED .7 DNX12 (GAUZE/BANDAGES/DRESSINGS) ×1 IMPLANT
ELECT REM PT RETURN 9FT ADLT (ELECTROSURGICAL) ×2
ELECTRODE REM PT RTRN 9FT ADLT (ELECTROSURGICAL) ×1 IMPLANT
FILTER SMOKE EVAC LAPAROSHD (FILTER) ×2 IMPLANT
GLOVE BIO SURGEON STRL SZ 6.5 (GLOVE) ×2 IMPLANT
GLOVE BIOGEL PI IND STRL 6.5 (GLOVE) ×1 IMPLANT
GLOVE BIOGEL PI IND STRL 7.0 (GLOVE) ×3 IMPLANT
GLOVE BIOGEL PI INDICATOR 6.5 (GLOVE) ×1
GLOVE BIOGEL PI INDICATOR 7.0 (GLOVE) ×2
GLOVE ECLIPSE 6.5 STRL STRAW (GLOVE) ×1 IMPLANT
GOWN STRL REUS W/TWL LRG LVL3 (GOWN DISPOSABLE) ×6 IMPLANT
HEMOSTAT SNOW SURGICEL 2X4 (HEMOSTASIS) ×2 IMPLANT
INST SET LAPROSCOPIC AP (KITS) ×2 IMPLANT
IV NS IRRIG 3000ML ARTHROMATIC (IV SOLUTION) ×1 IMPLANT
KIT TURNOVER KIT A (KITS) ×2 IMPLANT
MANIFOLD NEPTUNE II (INSTRUMENTS) ×2 IMPLANT
NDL INSUFFLATION 14GA 120MM (NEEDLE) ×1 IMPLANT
NEEDLE INSUFFLATION 14GA 120MM (NEEDLE) ×2 IMPLANT
NS IRRIG 1000ML POUR BTL (IV SOLUTION) ×2 IMPLANT
PACK LAP CHOLE LZT030E (CUSTOM PROCEDURE TRAY) ×2 IMPLANT
PAD ARMBOARD 7.5X6 YLW CONV (MISCELLANEOUS) ×2 IMPLANT
PAD TELFA 3X4 1S STER (GAUZE/BANDAGES/DRESSINGS) ×1 IMPLANT
SET BASIN LINEN APH (SET/KITS/TRAYS/PACK) ×2 IMPLANT
SET TUBE IRRIG SUCTION NO TIP (IRRIGATION / IRRIGATOR) ×1 IMPLANT
SLEEVE ENDOPATH XCEL 5M (ENDOMECHANICALS) ×2 IMPLANT
SPONGE DRAIN TRACH 4X4 STRL 2S (GAUZE/BANDAGES/DRESSINGS) ×1 IMPLANT
SUT ETHILON 3 0 FSL (SUTURE) ×1 IMPLANT
SUT MNCRL AB 4-0 PS2 18 (SUTURE) ×2 IMPLANT
SUT VICRYL 0 UR6 27IN ABS (SUTURE) ×3 IMPLANT
SYS BAG RETRIEVAL 10MM (BASKET) ×1
SYSTEM BAG RETRIEVAL 10MM (BASKET) ×1 IMPLANT
TAPE CLOTH SURG 4X10 WHT LF (GAUZE/BANDAGES/DRESSINGS) ×1 IMPLANT
TROCAR ENDO BLADELESS 11MM (ENDOMECHANICALS) ×2 IMPLANT
TROCAR XCEL NON-BLD 5MMX100MML (ENDOMECHANICALS) ×2 IMPLANT
TROCAR XCEL UNIV SLVE 11M 100M (ENDOMECHANICALS) ×2 IMPLANT
TUBE CONNECTING 12X1/4 (SUCTIONS) ×2 IMPLANT
TUBING INSUFFLATION (TUBING) ×2 IMPLANT
WARMER LAPAROSCOPE (MISCELLANEOUS) ×2 IMPLANT

## 2018-11-28 NOTE — Anesthesia Procedure Notes (Signed)
Procedure Name: Intubation Date/Time: 11/28/2018 12:12 PM Performed by: Andree Elk, Alexes Menchaca A, CRNA Pre-anesthesia Checklist: Patient identified, Patient being monitored, Timeout performed, Emergency Drugs available and Suction available Patient Re-evaluated:Patient Re-evaluated prior to induction Oxygen Delivery Method: Circle System Utilized Preoxygenation: Pre-oxygenation with 100% oxygen Induction Type: IV induction Ventilation: Mask ventilation without difficulty Laryngoscope Size: Glidescope and 3 Grade View: Grade I Tube type: Oral Tube size: 7.0 mm Number of attempts: 1 Airway Equipment and Method: Stylet Placement Confirmation: ETT inserted through vocal cords under direct vision,  positive ETCO2 and breath sounds checked- equal and bilateral Secured at: 21 cm Tube secured with: Tape Dental Injury: Teeth and Oropharynx as per pre-operative assessment

## 2018-11-28 NOTE — Progress Notes (Signed)
PT NPO since MN. SCDs on. Consent for HIDA scan with possible choley and liver biopsy signed and in chart. CHG bath wipes completed night prior to surgery and morning of.

## 2018-11-28 NOTE — Interval H&P Note (Signed)
History and Physical Interval Note:  11/28/2018 11:54 AM  [name] [name]  has presented today for surgery, with the diagnosis of cholecystitis- pending HIDA  The various methods of treatment have been discussed with the patient and family. After consideration of risks, benefits and other options for treatment, the patient has consented to  Procedure(s): LAPAROSCOPIC CHOLECYSTECTOMY (N/A) as a surgical intervention .  The patient's history has been reviewed, patient examined, no change in status, stable for surgery.  I have reviewed the patient's chart and labs.  Questions were answered to the patient's satisfaction.    HIDA positive proceeding with cholecystectomy.    [name]

## 2018-11-28 NOTE — Transfer of Care (Signed)
Immediate Anesthesia Transfer of Care Note  Patient: [name] [name]  Procedure(s) Performed: LAPAROSCOPIC CHOLECYSTECTOMY (N/A Abdomen)  Patient Location: PACU  Anesthesia Type:General  Level of Consciousness: awake, oriented and patient cooperative  Airway & Oxygen Therapy: Patient Spontanous Breathing  Post-op Assessment: Report given to RN and Post -op Vital signs reviewed and stable  Post vital signs: Reviewed and stable  Last Vitals:  Vitals Value Taken Time  BP 171/71 11/28/2018  1:51 PM  Temp    Pulse 83 11/28/2018  1:54 PM  Resp 10 11/28/2018  1:54 PM  SpO2 91 % 11/28/2018  1:54 PM  Vitals shown include unvalidated device data.  Last Pain:  Vitals:   11/28/18 1105  TempSrc: Oral  PainSc: 3       Patients Stated Pain Goal: 6 (69/62/95 2841)  Complications: No apparent anesthesia complications

## 2018-11-28 NOTE — Anesthesia Preprocedure Evaluation (Signed)
Anesthesia Evaluation  Patient identified by MRN, date of birth, ID band Patient awake    Reviewed: Allergy & Precautions, H&P , NPO status , Patient's Chart, lab work & pertinent test results, Unable to perform ROS - Chart review only  Airway Mallampati: III  TM Distance: >3 FB Neck ROM: full  Mouth opening: Limited Mouth Opening  Dental no notable dental hx.    Pulmonary asthma , sleep apnea ,    Pulmonary exam normal breath sounds clear to auscultation       Cardiovascular Exercise Tolerance: Good hypertension, + angina + CAD, + Past MI, + Cardiac Stents, + CABG, + Peripheral Vascular Disease and +CHF   Rhythm:regular Rate:Normal     Neuro/Psych  Headaches, PSYCHIATRIC DISORDERS Anxiety TIACVA    GI/Hepatic hiatal hernia, GERD  ,(+) Hepatitis -NASH (nonalcoholic steatohepatitis)   Endo/Other  negative endocrine ROSdiabetes  Renal/GU Renal disease  negative genitourinary   Musculoskeletal   Abdominal   Peds  Hematology negative hematology ROS (+)   Anesthesia Other Findings EF 55%  Reproductive/Obstetrics negative OB ROS                             Anesthesia Physical Anesthesia Plan  ASA: III  Anesthesia Plan: General   Post-op Pain Management:    Induction:   PONV Risk Score and Plan:   Airway Management Planned:   Additional Equipment:   Intra-op Plan:   Post-operative Plan:   Informed Consent: I have reviewed the patients History and Physical, chart, labs and discussed the procedure including the risks, benefits and alternatives for the proposed anesthesia with the patient or authorized representative who has indicated his/her understanding and acceptance.     Dental Advisory Given  Plan Discussed with: CRNA  Anesthesia Plan Comments:         Anesthesia Quick Evaluation

## 2018-11-28 NOTE — Op Note (Signed)
Operative Note   Preoperative Diagnosis: Acute cholecystitis, fatty liver    Postoperative Diagnosis: Same   Procedure(s) Performed: Laparoscopic cholecystectomy, liver wedge biopsy    Surgeon: Lanell Matar. Constance Haw, MD   Assistants: Aviva Signs, MD    Anesthesia: General endotracheal   Anesthesiologist: Nicanor Alcon, MD    Specimens: Gallbladder    Estimated Blood Loss: Minimal    Blood Replacement: None    Complications: None    Operative Findings:  Inflamed infected gallbladder with hydrops, nodular appearing liver with some fibrosis    Procedure: The patient was taken to the operating room and placed supine. General endotracheal anesthesia was induced. Intravenous antibiotics were  administered per protocol. An orogastric tube positioned to decompress the stomach. The abdomen was prepared and draped in the usual sterile fashion.    A supraumbilical incision was made and a Veress technique was utilized to achieve pneumoperitoneum to 15 mmHg with carbon dioxide. A 11 mm optiview port was placed through the supraumbilical region, and a 10 mm 0-degree operative laparoscope was introduced. The area underlying the trocar and Veress needle were inspected and without evidence of injury.  Remaining trocars were placed under direct vision. Two 5 mm ports were placed in the right abdomen, between the anterior axillary and midclavicular line.  A final 11 mm port was placed through the mid-epigastrium, near the falciform ligament.    The gallbladder was covered in omentum. This was peeled off and the gallbladder was distended and quite edematous. The gallbladder was decompressed and clear hydrops fluid was suctioned out.  The fundus was elevated cephalad and the infundibulum was retracted to the patient's right.  There was a thick rind on the gallbladder that was cauterized on the medial and lateral sides and the infundibulum was better defined. From here the cystic duct was found, and was  obviously going into the gallbladder. The mesentery was very inflamed and oozing, and there was some bleeding on the edge of the cystic duct. Given this the cystic duct was dissected and the hepatic bed could be seen behind the cystic duct and the edge of the gallbladder. The duct was triply clipped and divided.  The mesentery falling apart from the inflammation and was oozing.  To better define the mesentery, we doomed down with electrocautery and got the gallbladder on a mesenteric pedicle. I hugged the edge of the gallbladder and placed clips on the mesentery in the pedicle, and cauterized on the gallbladder side.  The gallbladder was placed above the liver, and I proceeded to take a wedge biopsy of the liver with electrocautery.  The specimens were placed in an Endopouch and was retrieved through the epigastric site.   The gallbladder bed and liver wedge bed were hemostatic.  The area was irrigated and suctioned.  Arista and Surgical Emogene Morgan were placed in the field. Final inspection revealed acceptable hemostasis. A JP drain was pulled through the most lateral port given the oozing nature and difficult dissection in order to rule out any bleeding or bile leak. Trocars were removed and pneumoperitoneum was released.   0 Vicryl fascial sutures were used to close the epigastric and umbilical port sites. Skin incisions were closed with 4-0 Monocryl subcuticular sutures and Dermabond. The drain was secured with a 3-0 nylon suture. The patient was awakened from anesthesia and extubated without complication.    Curlene Labrum, MD [facility] [address] [place], [place] [address] [phone] (office)

## 2018-11-28 NOTE — Progress Notes (Signed)
PROGRESS NOTE    [name] [name]  JQZ:[id number] DOB: [birth year] DOA: 11/25/2018 PCP: Terrial Rhodes, MD    Brief Narrative:  63 year old male who was recently in the hospital for left-sided chest pain underwent coronary angiography.  He was discharged to the hospital after catheterization showed stable coronary artery disease with recommendations for medical management.  He returned to the hospital with 2 days of persistent right upper quadrant pain, vomiting and fever.  Imaging of gallbladder has been inconclusive, but clinically there is concern for cholecystitis.  HIDA scan has been ordered and he is on intravenous antibiotics.  General surgery following.   Assessment & Plan:   Active Problems:   Mixed hyperlipidemia   Essential hypertension, benign   Type 2 diabetes mellitus (HCC)   Fever   Thrombocytopenia (HCC)   NASH (nonalcoholic steatohepatitis)   Acute diastolic CHF (congestive heart failure) (HCC)   Right upper quadrant abdominal pain   Acute respiratory failure with hypoxia (HCC)   RUQ abdominal pain   Calculus of gallbladder with acute cholecystitis without obstruction   Fatty liver   1. Acute cholecystitis.  Seen by general surgery and underwent cholecystectomy on 1/20.  JP drain placed.  Per surgery, he does not need further IV antibiotics.  Defer further postoperative care to surgery.  Currently on clear liquids. 2. Diabetes type 2.  Metformin currently on hold.  He is continued on basal insulin.  Continue sliding scale insulin.  Blood sugars been stable 3. Coronary artery disease.  Status post CABG.  Had cardiac catheterization earlier last week.  Found to have severe three-vessel disease, but no significant changes from prior cath.  Recommendations were for continued medical management. 4. Obstructive sleep apnea. 5. Hypertension.  Blood pressure currently stable.  Continue on metoprolol. 6. Thrombocytopenia.  Felt to be secondary to underlying liver disease.   Currently stable.  Continue to monitor. [place].  Patient noted to have a history of fatty liver.  Patient also had liver biopsy performed during cholecystectomy. 8. Acute kidney injury.  Baseline creatinine 0.8.  Creatinine has trended up to 1.5.  He had received a dose of intravenous Lasix on admission.  Does not appear to be particularly volume overloaded.  Creatinine is trending down.  Continue to monitor   DVT prophylaxis: SCDs Code Status: Full code Family Communication: Discussed with patient and wife at the bedside Disposition Plan: Discharge home once improved   Consultants:   General surgery  Procedures:   Cholecystectomy with liver biopsy  Antimicrobials:   Ceftriaxone 1/18 > 1/20  Flagyl 1/19 > 1/20   Subjective: Patient seen in his room prior to surgery.  No vomiting.  Tolerating clear liquids.  Objective: Vitals:   11/28/18 1415 11/28/18 1430 11/28/18 1456 11/28/18 1700  BP: (!) 174/73 126/75 (!) 169/69 135/60  Pulse: 84 86  76  Resp: 13 16 18 11   Temp:    (!) 97.4 F (36.3 C)  TempSrc:    Axillary  SpO2: 93% 91% 93% 96%  Weight:      Height:        Intake/Output Summary (Last 24 hours) at 11/28/2018 2024 Last data filed at 11/28/2018 1843 Gross per 24 hour  Intake 1803.91 ml  Output 1030 ml  Net 773.91 ml   Filed Weights   11/25/18 1926 11/26/18 0747  Weight: 107.7 kg 103.3 kg    Examination:  General exam: Alert, awake, oriented x 3 Respiratory system: Clear to auscultation. Respiratory effort normal. Cardiovascular system:RRR. No murmurs,  rubs, gallops. Gastrointestinal system: Abdomen is nondistended, soft and right upper quadrant tenderness. No organomegaly or masses felt. Normal bowel sounds heard. Central nervous system: Alert and oriented. No focal neurological deficits. Extremities: No C/C/E, +pedal pulses Skin: No rashes, lesions or ulcers Psychiatry: Judgement and insight appear normal. Mood & affect appropriate.      Data  Reviewed: I have personally reviewed following labs and imaging studies  CBC: Recent Labs  Lab 11/22/18 0641  11/24/18 1323 11/25/18 2200 11/26/18 0526 11/27/18 0442 11/28/18 0605  WBC 2.8*   < > 10.3 8.5 7.5 6.9 4.8  NEUTROABS 1.5*  --   --  6.6  --   --   --   HGB 13.1   < > 16.4 15.4 13.6 12.8* 12.0*  HCT 38.6*   < > 48.9 47.1 41.1 39.0 36.8*  MCV 89.4   < > 90.7 93.3 92.8 93.8 93.6  PLT 77*   < > 101* 93* 82* 95* 96*   < > = values in this interval not displayed.   Basic Metabolic Panel: Recent Labs  Lab 11/24/18 1323 11/25/18 2200 11/26/18 0526 11/27/18 0442 11/28/18 0605  NA 136 132* 137 135 136  K 4.4 3.6 3.7 3.5 3.4*  CL 100 100 104 100 101  CO2 22 24 23 26 24   GLUCOSE 230* 162* 154* 195* 105*  BUN 15 32* 30* 33* 25*  CREATININE 1.32* 1.41* 1.28* 1.56* 1.33*  CALCIUM 9.3 8.6* 8.1* 8.2* 8.2*   GFR: Estimated Creatinine Clearance: 69.9 mL/min (A) (by C-G formula based on SCr of 1.33 mg/dL (H)). Liver Function Tests: Recent Labs  Lab 11/24/18 1323 11/25/18 2200 11/26/18 0526 11/27/18 0442 11/28/18 0605  AST 40 30 23 24 23   ALT 33 33 27 24 22   ALKPHOS 49 44 35* 41 47  BILITOT 1.3* 1.9* 1.7* 1.4* 1.1  PROT 7.9 8.4* 6.8 6.7 6.8  ALBUMIN 3.8 3.8 3.1* 2.9* 2.8*   Recent Labs  Lab 11/23/18 2058 11/24/18 1323 11/25/18 2200  LIPASE 36 26 22   No results for input(s): AMMONIA in the last 168 hours. Coagulation Profile: No results for input(s): INR, PROTIME in the last 168 hours. Cardiac Enzymes: Recent Labs  Lab 11/23/18 2058  TROPONINI <0.03   BNP (last 3 results) No results for input(s): PROBNP in the last 8760 hours. HbA1C: Recent Labs    11/26/18 0526  HGBA1C 7.2*   CBG: Recent Labs  Lab 11/27/18 1627 11/27/18 2055 11/28/18 0959 11/28/18 1353 11/28/18 1708  GLUCAP 193* 129* 87 94 118*   Lipid Profile: No results for input(s): CHOL, HDL, LDLCALC, TRIG, CHOLHDL, LDLDIRECT in the last 72 hours. Thyroid Function Tests: No results  for input(s): TSH, T4TOTAL, FREET4, T3FREE, THYROIDAB in the last 72 hours. Anemia Panel: No results for input(s): VITAMINB12, FOLATE, FERRITIN, TIBC, IRON, RETICCTPCT in the last 72 hours. Sepsis Labs: No results for input(s): PROCALCITON, LATICACIDVEN in the last 168 hours.  Recent Results (from the past 240 hour(s))  MRSA PCR Screening     Status: None   Collection Time: 11/26/18  7:51 AM  Result Value Ref Range Status   MRSA by PCR NEGATIVE NEGATIVE Final    Comment:        The GeneXpert MRSA Assay (FDA approved for NASAL specimens only), is one component of a comprehensive MRSA colonization surveillance program. It is not intended to diagnose MRSA infection nor to guide or monitor treatment for MRSA infections. Performed at [facility], [address], [place],  [place] [address]          Radiology Studies: Nm Hepatobiliary Liver Func  Result Date: 11/28/2018 CLINICAL DATA:  Right upper quadrant abdominal pain and nausea for 1 week. The gallbladder was distended with no visible gallstones on a recent ultrasound. EXAM: NUCLEAR MEDICINE HEPATOBILIARY IMAGING TECHNIQUE: Sequential images of the abdomen were obtained out to 60 minutes following intravenous administration of radiopharmaceutical. RADIOPHARMACEUTICALS:  4.75 mCi Tc-39m Choletec IV COMPARISON:  Right upper quadrant abdomen ultrasound dated 11/24/2018 and abdomen and pelvis CT dated 11/23/2018. FINDINGS: Prompt uptake and biliary excretion of activity by the liver is seen. Biliary activity passes into small bowel, consistent with patent common bile duct. The gallbladder was not visualized up to 90 minutes post injection despite the intravenous administration of 4 mg of morphine. IMPRESSION: Nonvisualized gallbladder, compatible with cystic duct obstruction. Electronically Signed   By: SClaudie ReveringM.D.   On: 11/28/2018 10:08        Scheduled Meds: . amLODipine  5 mg Oral QHS  . aspirin EC  81 mg Oral QHS  .  citalopram  20 mg Oral QHS  . ezetimibe  10 mg Oral QHS  . gabapentin  300 mg Oral TID  . insulin aspart  0-15 Units Subcutaneous TID WC  . insulin aspart  0-5 Units Subcutaneous QHS  . insulin glargine  60 Units Subcutaneous q morning - 10a  . insulin glargine  70 Units Subcutaneous QHS  . isosorbide mononitrate  30 mg Oral QHS  . loratadine  10 mg Oral Daily  . metoprolol tartrate  50 mg Oral QHS  . mometasone-formoterol  2 puff Inhalation BID  . pantoprazole  40 mg Oral Daily  . pravastatin  20 mg Oral Daily  . sodium chloride flush  3 mL Intravenous Q12H  . topiramate  25 mg Oral BID   Continuous Infusions: . sodium chloride    . dextrose 5 % and 0.9% NaCl 75 mL/hr at 11/28/18 1843     LOS: 0 days    Time spent: 355ms    JeKathie DikeMD [facility]   If 7PM-7AM, please contact night-coverage [email or web address]  11/28/2018, 8:24 PM

## 2018-11-28 NOTE — Anesthesia Postprocedure Evaluation (Signed)
Anesthesia Post Note  Patient: [name] [name]  Procedure(s) Performed: LAPAROSCOPIC CHOLECYSTECTOMY (N/A Abdomen)  Patient location during evaluation: PACU Anesthesia Type: General Level of consciousness: awake and alert and oriented Pain management: pain level controlled Vital Signs Assessment: post-procedure vital signs reviewed and stable Respiratory status: spontaneous breathing Cardiovascular status: stable Postop Assessment: no apparent nausea or vomiting Anesthetic complications: no     Last Vitals:  Vitals:   11/28/18 1400 11/28/18 1415  BP: (!) 176/64 (!) 174/73  Pulse: 83 84  Resp: 11 13  Temp:    SpO2: 93% 93%    Last Pain:  Vitals:   11/28/18 1415  TempSrc:   PainSc: 0-No pain                 Ryn Peine A

## 2018-11-29 ENCOUNTER — Encounter (HOSPITAL_COMMUNITY): Payer: Self-pay | Admitting: General Surgery

## 2018-11-29 LAB — GLUCOSE, CAPILLARY
Glucose-Capillary: 225 mg/dL — ABNORMAL HIGH (ref 70–99)
Glucose-Capillary: 221 mg/dL — ABNORMAL HIGH (ref 70–99)
Glucose-Capillary: 293 mg/dL — ABNORMAL HIGH (ref 70–99)

## 2018-11-29 LAB — CBC WITH DIFFERENTIAL/PLATELET
Abs Immature Granulocytes: 0.07 10*3/uL (ref 0.00–0.07)
BASOS ABS: 0 10*3/uL (ref 0.0–0.1)
Basophils Relative: 0 %
Eosinophils Absolute: 0 10*3/uL (ref 0.0–0.5)
Eosinophils Relative: 0 %
HCT: 36.2 % — ABNORMAL LOW (ref 39.0–52.0)
Hemoglobin: 12 g/dL — ABNORMAL LOW (ref 13.0–17.0)
Immature Granulocytes: 1 %
Lymphocytes Relative: 6 %
Lymphs Abs: 0.3 10*3/uL — ABNORMAL LOW (ref 0.7–4.0)
MCH: 31.3 pg (ref 26.0–34.0)
MCHC: 33.1 g/dL (ref 30.0–36.0)
MCV: 94.5 fL (ref 80.0–100.0)
Monocytes Absolute: 0.4 10*3/uL (ref 0.1–1.0)
Monocytes Relative: 7 %
NEUTROS ABS: 4.3 10*3/uL (ref 1.7–7.7)
Neutrophils Relative %: 86 %
Platelets: 112 10*3/uL — ABNORMAL LOW (ref 150–400)
RBC: 3.83 MIL/uL — ABNORMAL LOW (ref 4.22–5.81)
RDW: 13.2 % (ref 11.5–15.5)
WBC: 5 10*3/uL (ref 4.0–10.5)
nRBC: 0 % (ref 0.0–0.2)

## 2018-11-29 LAB — COMPREHENSIVE METABOLIC PANEL
ALT: 27 U/L (ref 0–44)
AST: 40 U/L (ref 15–41)
Albumin: 2.5 g/dL — ABNORMAL LOW (ref 3.5–5.0)
Alkaline Phosphatase: 42 U/L (ref 38–126)
Anion gap: 9 (ref 5–15)
BUN: 16 mg/dL (ref 6–20)
CO2: 24 mmol/L (ref 22–32)
Calcium: 8.2 mg/dL — ABNORMAL LOW (ref 8.9–10.3)
Chloride: 103 mmol/L (ref 98–111)
Creatinine, Ser: 1.02 mg/dL (ref 0.61–1.24)
GFR calc Af Amer: >60 mL/min (ref >60)
GFR calc non Af Amer: >60 mL/min (ref >60)
GLUCOSE: 259 mg/dL — AB (ref 70–99)
Potassium: 4.3 mmol/L (ref 3.5–5.1)
Sodium: 136 mmol/L (ref 135–145)
Total Bilirubin: 0.5 mg/dL (ref 0.3–1.2)
Total Protein: 6.4 g/dL — ABNORMAL LOW (ref 6.5–8.1)

## 2018-11-29 MED ORDER — DOCUSATE SODIUM 100 MG PO CAPS
100.0000 mg | ORAL_CAPSULE | Freq: Two times a day (BID) | ORAL | Status: DC
  Administered 2018-11-29 – 2018-11-30 (×2): 100 mg via ORAL
  Filled 2018-11-29 (×2): qty 1

## 2018-11-29 NOTE — Progress Notes (Signed)
  Progress Note   Date: 11/29/2018  Patient Name: [name] [name]        MRN#: [id number]   Clarification of diagnosis:   Acute respiratory failure with hypoxia present on admission    Irwin Brakeman, MD

## 2018-11-29 NOTE — Progress Notes (Signed)
[facility] Progress Note  1 Day Post-Op  Subjective: Doing fair on soft diet. Says has some bloating. JP with output that is SS.  NO bile in drain.   Objective: Vital signs in last 24 hours: Temp:  [98.1 F (36.7 C)-98.6 F (37 C)] 98.6 F (37 C) (01/21 0400) Pulse Rate:  [69-87] 70 (01/21 0800) Resp:  [9-21] 21 (01/21 0800) BP: (129-168)/(67-85) 131/67 (01/21 0800) SpO2:  [90 %-95 %] 90 % (01/21 2010) Weight:  [103.5 kg] 103.5 kg (01/21 0500) Last BM Date: 11/25/18  Intake/Output from previous day: 01/20 0701 - 01/21 0700 In: 1509.6 [P.O.:390; I.V.:954.6; IV Piggyback:165] Out: 2671 [Urine:1625; Drains:175; Blood:30] Intake/Output this shift: No intake/output data recorded.  General appearance: alert, cooperative and no distress Resp: normal work breathing GI: soft, distended, appropriately tender, port sites c/d/i with dermabond, JP with SS output  Lab Results:  Recent Labs    11/28/18 0605 11/29/18 0419  WBC 4.8 5.0  HGB 12.0* 12.0*  HCT 36.8* 36.2*  PLT 96* 112*   BMET Recent Labs    11/28/18 0605 11/29/18 0419  NA 136 136  K 3.4* 4.3  CL 101 103  CO2 24 24  GLUCOSE 105* 259*  BUN 25* 16  CREATININE 1.33* 1.02  CALCIUM 8.2* 8.2*   PT/INR No results for input(s): LABPROT, INR in the last 72 hours.  Studies/Results: Nm Hepatobiliary Liver Func  Result Date: 11/28/2018 CLINICAL DATA:  Right upper quadrant abdominal pain and nausea for 1 week. The gallbladder was distended with no visible gallstones on a recent ultrasound. EXAM: NUCLEAR MEDICINE HEPATOBILIARY IMAGING TECHNIQUE: Sequential images of the abdomen were obtained out to 60 minutes following intravenous administration of radiopharmaceutical. RADIOPHARMACEUTICALS:  4.75 mCi Tc-96m Choletec IV COMPARISON:  Right upper quadrant abdomen ultrasound dated 11/24/2018 and abdomen and pelvis CT dated 11/23/2018. FINDINGS: Prompt uptake and biliary excretion of activity by the liver is  seen. Biliary activity passes into small bowel, consistent with patent common bile duct. The gallbladder was not visualized up to 90 minutes post injection despite the intravenous administration of 4 mg of morphine. IMPRESSION: Nonvisualized gallbladder, compatible with cystic duct obstruction. Electronically Signed   By: SClaudie ReveringM.D.   On: 11/28/2018 10:08    Anti-infectives: Anti-infectives (From admission, onward)   Start     Dose/Rate Route Frequency Ordered Stop   11/28/18 0600  cefoTEtan (CEFOTAN) 2 g in sodium chloride 0.9 % 100 mL IVPB     2 g 200 mL/hr over 30 Minutes Intravenous On call to O.R. 11/27/18 2102 11/28/18 2029   11/27/18 1000  metroNIDAZOLE (FLAGYL) IVPB 500 mg  Status:  Discontinued     500 mg 100 mL/hr over 60 Minutes Intravenous Every 8 hours 11/27/18 0920 11/28/18 1500   11/26/18 1100  cefTRIAXone (ROCEPHIN) 2 g in sodium chloride 0.9 % 100 mL IVPB  Status:  Discontinued     2 g 200 mL/hr over 30 Minutes Intravenous Every 24 hours 11/26/18 1012 11/28/18 1500      Assessment/Plan: Mr. BSilveriois a 63yo POD 1 s/p Lap chole and liver biopsy for acute cholecystitis and fatty liver. Doing fair. Tolerating diet but some bloating.  PRN For pain IS, OOB CBC and CMP in the AM given drain output  If tolerating diet and pain controlled, pending labs could potentially go tomorrow and follow up to get drain out Tuesday    LOS: 1 day    [name]1/21/2020

## 2018-11-29 NOTE — Progress Notes (Signed)
PROGRESS NOTE    [name] [name]  OAC:[id number] DOB: 1955-12-25 DOA: 11/25/2018 PCP: Terrial Rhodes, MD    Brief Narrative:  63 year old male who was recently in the hospital for left-sided chest pain underwent coronary angiography.  He was discharged to the hospital after catheterization showed stable coronary artery disease with recommendations for medical management.  He returned to the hospital with 2 days of persistent right upper quadrant pain, vomiting and fever.  Imaging of gallbladder has been inconclusive, but clinically there is concern for cholecystitis.  HIDA scan has been ordered and he is on intravenous antibiotics.  General surgery following.  Assessment & Plan:   Active Problems:   Mixed hyperlipidemia   Essential hypertension, benign   Type 2 diabetes mellitus (HCC)   Fever   Thrombocytopenia (HCC)   NASH (nonalcoholic steatohepatitis)   Acute diastolic CHF (congestive heart failure) (HCC)   Right upper quadrant abdominal pain   Acute respiratory failure with hypoxia (HCC)   RUQ abdominal pain   Calculus of gallbladder with acute cholecystitis without obstruction   Fatty liver  1. Acute cholecystitis.  Seen by general surgery and underwent cholecystectomy on 1/20.  JP drain placed.  Per surgery, he does not need further IV antibiotics.  Defer further postoperative care to surgery.  He is tolerating clear liquid diet.   2. Diabetes type 2.  Metformin currently on hold.  He is continued on basal insulin.  Continue sliding scale insulin.  Blood sugars been stable.   3. Coronary artery disease.  Status post CABG.  Had cardiac catheterization earlier last week.  Found to have severe three-vessel disease, but no significant changes from prior cath.  Recommendations were for continued medical management. 4. Obstructive sleep apnea. 5. Hypertension.  Blood pressure currently stable.  Continue on metoprolol. 6. Thrombocytopenia.  Felt to be secondary to underlying liver  disease.  Currently stable.  Continue to monitor. 7. NASH - Patient noted to have a history of fatty liver.  Patient also had liver biopsy performed during cholecystectomy. 8. Acute kidney injury.  Baseline creatinine 0.8.  Creatinine has trended up to 1.5.  He had received a dose of intravenous Lasix on admission.  Creatinine is improved.   DVT prophylaxis: SCDs Code Status: Full code Family Communication: Discussed with patient and wife at the bedside Disposition Plan: Discharge home once improved   Consultants:   General surgery  Procedures:   Cholecystectomy with liver biopsy  Antimicrobials:   Ceftriaxone 1/18 > 1/20  Flagyl 1/19 > 1/20   Subjective: Patient tolerating clear liquids.  He says he has minimal abd pain otherwise no complaints.   Objective: Vitals:   11/29/18 0000 11/29/18 0400 11/29/18 0500 11/29/18 0800  BP: 139/73 129/72  131/67  Pulse: 78 69  70  Resp: 10 (!) 9  (!) 21  Temp: 98.1 F (36.7 C) 98.6 F (37 C)    TempSrc: Oral Oral    SpO2: 95% 95%  95%  Weight:   103.5 kg   Height:        Intake/Output Summary (Last 24 hours) at 11/29/2018 1017 Last data filed at 11/29/2018 0906 Gross per 24 hour  Intake 1509.55 ml  Output 2330 ml  Net -820.45 ml   Filed Weights   11/25/18 1926 11/26/18 0747 11/29/18 0500  Weight: 107.7 kg 103.3 kg 103.5 kg    Examination:  General exam: Alert, awake, oriented x 3 Respiratory system: Clear to auscultation. Respiratory effort normal. Cardiovascular system: normal s1.s2 sounds.  No murmurs, rubs, gallops. Gastrointestinal system: Abdomen is nondistended, soft and right upper quadrant tenderness. No organomegaly or masses felt. Normal bowel sounds heard. Central nervous system: Alert and oriented. No focal neurological deficits. Extremities: No C/C/E, +pedal pulses Skin: No rashes, lesions or ulcers Psychiatry: Judgement and insight appear normal. Mood & affect appropriate.   Data Reviewed: I have  personally reviewed following labs and imaging studies  CBC: Recent Labs  Lab 11/25/18 2200 11/26/18 0526 11/27/18 0442 11/28/18 0605 11/29/18 0419  WBC 8.5 7.5 6.9 4.8 5.0  NEUTROABS 6.6  --   --   --  4.3  HGB 15.4 13.6 12.8* 12.0* 12.0*  HCT 47.1 41.1 39.0 36.8* 36.2*  MCV 93.3 92.8 93.8 93.6 94.5  PLT 93* 82* 95* 96* 932*   Basic Metabolic Panel: Recent Labs  Lab 11/25/18 2200 11/26/18 0526 11/27/18 0442 11/28/18 0605 11/29/18 0419  NA 132* 137 135 136 136  K 3.6 3.7 3.5 3.4* 4.3  CL 100 104 100 101 103  CO2 24 23 26 24 24   GLUCOSE 162* 154* 195* 105* 259*  BUN 32* 30* 33* 25* 16  CREATININE 1.41* 1.28* 1.56* 1.33* 1.02  CALCIUM 8.6* 8.1* 8.2* 8.2* 8.2*   GFR: Estimated Creatinine Clearance: 91.3 mL/min (by C-G formula based on SCr of 1.02 mg/dL). Liver Function Tests: Recent Labs  Lab 11/25/18 2200 11/26/18 0526 11/27/18 0442 11/28/18 0605 11/29/18 0419  AST 30 23 24 23  40  ALT 33 27 24 22 27   ALKPHOS 44 35* 41 47 42  BILITOT 1.9* 1.7* 1.4* 1.1 0.5  PROT 8.4* 6.8 6.7 6.8 6.4*  ALBUMIN 3.8 3.1* 2.9* 2.8* 2.5*   Recent Labs  Lab 11/23/18 2058 11/24/18 1323 11/25/18 2200  LIPASE 36 26 22   No results for input(s): AMMONIA in the last 168 hours. Coagulation Profile: No results for input(s): INR, PROTIME in the last 168 hours. Cardiac Enzymes: Recent Labs  Lab 11/23/18 2058  TROPONINI <0.03   BNP (last 3 results) No results for input(s): PROBNP in the last 8760 hours. HbA1C: No results for input(s): HGBA1C in the last 72 hours. CBG: Recent Labs  Lab 11/28/18 0959 11/28/18 1353 11/28/18 1708 11/28/18 2211 11/29/18 0734  GLUCAP 87 94 118* 265* 225*   Lipid Profile: No results for input(s): CHOL, HDL, LDLCALC, TRIG, CHOLHDL, LDLDIRECT in the last 72 hours. Thyroid Function Tests: No results for input(s): TSH, T4TOTAL, FREET4, T3FREE, THYROIDAB in the last 72 hours. Anemia Panel: No results for input(s): VITAMINB12, FOLATE, FERRITIN,  TIBC, IRON, RETICCTPCT in the last 72 hours. Sepsis Labs: No results for input(s): PROCALCITON, LATICACIDVEN in the last 168 hours.  Recent Results (from the past 240 hour(s))  MRSA PCR Screening     Status: None   Collection Time: 11/26/18  7:51 AM  Result Value Ref Range Status   MRSA by PCR NEGATIVE NEGATIVE Final    Comment:        The GeneXpert MRSA Assay (FDA approved for NASAL specimens only), is one component of a comprehensive MRSA colonization surveillance program. It is not intended to diagnose MRSA infection nor to guide or monitor treatment for MRSA infections. Performed at [facility], [address], [place], [place] [address]    Radiology Studies: Nm Hepatobiliary Liver Func  Result Date: 11/28/2018 CLINICAL DATA:  Right upper quadrant abdominal pain and nausea for 1 week. The gallbladder was distended with no visible gallstones on a recent ultrasound. EXAM: NUCLEAR MEDICINE HEPATOBILIARY IMAGING TECHNIQUE: Sequential images of the  abdomen were obtained out to 60 minutes following intravenous administration of radiopharmaceutical. RADIOPHARMACEUTICALS:  4.75 mCi Tc-7m Choletec IV COMPARISON:  Right upper quadrant abdomen ultrasound dated 11/24/2018 and abdomen and pelvis CT dated 11/23/2018. FINDINGS: Prompt uptake and biliary excretion of activity by the liver is seen. Biliary activity passes into small bowel, consistent with patent common bile duct. The gallbladder was not visualized up to 90 minutes post injection despite the intravenous administration of 4 mg of morphine. IMPRESSION: Nonvisualized gallbladder, compatible with cystic duct obstruction. Electronically Signed   By: SClaudie ReveringM.D.   On: 11/28/2018 10:08   Scheduled Meds: . amLODipine  5 mg Oral QHS  . aspirin EC  81 mg Oral QHS  . citalopram  20 mg Oral QHS  . ezetimibe  10 mg Oral QHS  . gabapentin  300 mg Oral TID  . insulin aspart  0-15 Units Subcutaneous TID WC  . insulin aspart  0-5 Units  Subcutaneous QHS  . insulin glargine  60 Units Subcutaneous q morning - 10a  . insulin glargine  70 Units Subcutaneous QHS  . isosorbide mononitrate  30 mg Oral QHS  . loratadine  10 mg Oral Daily  . metoprolol tartrate  50 mg Oral QHS  . mometasone-formoterol  2 puff Inhalation BID  . pantoprazole  40 mg Oral Daily  . pravastatin  20 mg Oral Daily  . sodium chloride flush  3 mL Intravenous Q12H  . topiramate  25 mg Oral BID   Continuous Infusions: . sodium chloride       LOS: 1 day   Time spent: 30 mins  Clanford Johnson, MD [facility]  If 7PM-7AM, please contact night-coverage [email or web address]  11/29/2018, 10:17 AM

## 2018-11-30 ENCOUNTER — Encounter (HOSPITAL_COMMUNITY): Payer: Self-pay | Admitting: Family Medicine

## 2018-11-30 ENCOUNTER — Ambulatory Visit: Payer: Medicare Other | Admitting: Adult Health

## 2018-11-30 DIAGNOSIS — K7460 Unspecified cirrhosis of liver: Secondary | ICD-10-CM

## 2018-11-30 LAB — CBC WITH DIFFERENTIAL/PLATELET
Abs Immature Granulocytes: 0.03 10*3/uL (ref 0.00–0.07)
Basophils Absolute: 0 10*3/uL (ref 0.0–0.1)
Basophils Relative: 0 %
Eosinophils Absolute: 0 10*3/uL (ref 0.0–0.5)
Eosinophils Relative: 0 %
HCT: 33.5 % — ABNORMAL LOW (ref 39.0–52.0)
Hemoglobin: 11.1 g/dL — ABNORMAL LOW (ref 13.0–17.0)
Immature Granulocytes: 1 %
Lymphocytes Relative: 13 %
Lymphs Abs: 0.6 10*3/uL — ABNORMAL LOW (ref 0.7–4.0)
MCH: 31.1 pg (ref 26.0–34.0)
MCHC: 33.1 g/dL (ref 30.0–36.0)
MCV: 93.8 fL (ref 80.0–100.0)
Monocytes Absolute: 0.4 10*3/uL (ref 0.1–1.0)
Monocytes Relative: 8 %
Neutro Abs: 3.7 10*3/uL (ref 1.7–7.7)
Neutrophils Relative %: 78 %
Platelets: 132 10*3/uL — ABNORMAL LOW (ref 150–400)
RBC: 3.57 MIL/uL — ABNORMAL LOW (ref 4.22–5.81)
RDW: 13.2 % (ref 11.5–15.5)
WBC: 4.7 10*3/uL (ref 4.0–10.5)
nRBC: 0 % (ref 0.0–0.2)

## 2018-11-30 LAB — COMPREHENSIVE METABOLIC PANEL
ALT: 29 U/L (ref 0–44)
ANION GAP: 7 (ref 5–15)
AST: 35 U/L (ref 15–41)
Albumin: 2.4 g/dL — ABNORMAL LOW (ref 3.5–5.0)
Alkaline Phosphatase: 46 U/L (ref 38–126)
BUN: 22 mg/dL — ABNORMAL HIGH (ref 6–20)
CO2: 26 mmol/L (ref 22–32)
Calcium: 8.1 mg/dL — ABNORMAL LOW (ref 8.9–10.3)
Chloride: 104 mmol/L (ref 98–111)
Creatinine, Ser: 1.13 mg/dL (ref 0.61–1.24)
GFR calc non Af Amer: >60 mL/min (ref >60)
Glucose, Bld: 219 mg/dL — ABNORMAL HIGH (ref 70–99)
Potassium: 3.7 mmol/L (ref 3.5–5.1)
Sodium: 137 mmol/L (ref 135–145)
Total Bilirubin: 0.5 mg/dL (ref 0.3–1.2)
Total Protein: 6.3 g/dL — ABNORMAL LOW (ref 6.5–8.1)

## 2018-11-30 LAB — GLUCOSE, CAPILLARY
GLUCOSE-CAPILLARY: 178 mg/dL — AB (ref 70–99)
Glucose-Capillary: 183 mg/dL — ABNORMAL HIGH (ref 70–99)
Glucose-Capillary: 123 mg/dL — ABNORMAL HIGH (ref 70–99)

## 2018-11-30 MED ORDER — DOCUSATE SODIUM 100 MG PO CAPS: 100.0000 mg | ORAL_CAPSULE | Freq: Two times a day (BID) | ORAL | 0 refills | Status: DC

## 2018-11-30 NOTE — Progress Notes (Signed)
Patient and wife taught how to empty J-P drain, per Dr Rita Ohara understanding with returned demonstration.Will continue to assess and monitor patient for further educational needs.

## 2018-11-30 NOTE — Progress Notes (Signed)
11/30/2018 12:54 PM  I contacted Dr. Laural Golden regarding liver biopsy.  Ok for outpatient follow up.   Murvin Natal MD

## 2018-11-30 NOTE — Progress Notes (Signed)
Pt still asleep on CPAP. Wife at bedside. I will return later to administer MDI

## 2018-11-30 NOTE — Progress Notes (Deleted)
[facility] [address] [place]. [place] [address] [phone]       OFFICE FOLLOW UP NOTE  [name] Date of Birth:  Jun 10, 1956 Medical Record Number:  [id number]   Reason for Referral:  hospital stroke follow up  CHIEF COMPLAINT:  No chief complaint on file.   HPI:  Interval history 11/30/2018: Patient is being seen today for 44-monthfollow-up visit.  Per review of notes, patient was admitted to the ED on 11/23/2018 and after cardiac cath he was found to have heart failure with pEF and was discharged with Lasix.  He returned to ED on 11/25/2018 with complaints of abdominal pain as he was at PCP office and was found to have a fever of 102 F and was sent to the ED for further evaluation.  He was found to be mildly hypoxic with subjective SOB requiring 2 L/min [place].  Per notes, unable to obtain Lasix prescription as previously recommended.   08/30/2018 visit: Patient is being seen today for hospital follow up. Continues to have some blurry vision and was told he has "a weak eye" by a provider in the hospital. He has not follow up with his ophthalmology  since discharge. 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 Has returned to driving without complications. Memory at times is slightly slower but has been improving. Completed 3 weeks of DAPT and continues aspriin 824monly without side effects of bleeding or bruising. Continues to take pravastatin and zetia without myalgias. Blood pressure today 125/71 which is normal for patient as he monitors at home. Patient currently has therapy dog which has been helping patient with his stress and anxiety. History of OSA but has not been using CPAP as he states he is unable to find the correct company to replace parts.  He has not used his CPAP in 2 to 3 months.  He was advised to follow-up with provider who is managing [place] but [place].  [place] therefore he is requesting establishment with new OSA provider.  No further concerns at this time. Denies new or worsening TIA/stroke symptoms.  Side notes, patient complains of ankle swelling towards end of the day. Denies use of compression stockings. He will elevate with positive effect but concerned due to recent worsening over the past 2-3 weeks. He does state that he has been increasing his exercise in this time with obtaining PT/OT.  He denies shortness of breath, increased fatigue or chest pain.  History summary: ThDequan Kindredroomeis an [age].o.malewith PMH TIA, DM 2, HLD, CAD s/p CABG, OSA, migraines, CHF, anxiety and arthritis [facility], and confusion.  Telemetry neurology was consulted.  CT head was negative for acute abnormality.  CTA head and neck show calcified arthrosclerosis disease at the carotid bifurcation without hemodynamically significant stenosis without evidence of dissection or aneurysm therefore IV [facility]PA was administered [facility]  MRI brain reviewed and was negative for acute infarct or hemorrhage.  MRA negative.  Carotid Doppler showed bilateral ICA stenosis of 1 to 39% with VAs antegrade.  2D echo showed an EF of 60 to 65% without cardiac source of embolus identified.  LDL 38 and recommended resuming Pravachol and  Zetia at discharge.  A1c 7.3 and recommended continued follow-up with PCP for DM management.  HTN stable during admission recommended long-term BP goal normotensive range.  Patient was previously on aspirin and Plavix and it was recommended to continue this for 3 weeks and then aspirin 81 mg alone.  Patient was discharged home with recommendations of outpatient PT/OT in stable condition.      ROS:   14 system review of  systems performed and negative with exception of swelling in legs, hearing loss, ringing in ears, moles, blurred vision, snoring, numbness, difficulty swallowing, dizziness, anxiety, insomnia and restless leg  PMH:  Past Medical History:  Diagnosis Date  . Anxiety   . Arthritis   . Asthma   . Cataract    Right eye  . CHF (congestive heart failure) ([facility])   . Chronic lower back pain   . Coronary atherosclerosis of native coronary artery    a. LAD & OM stenting;  b. 2007 Cath: nonobs dzs, patent stents;  c. 07/2012 neg MV, EF 61%.  d. 11/20/14: [place] s/p  PCI w/ DES to pLAD and DES to OM1   . Essential hypertension, benign   . GERD (gastroesophageal reflux disease)   . Hearing loss of left ear   . History of gout   . History of hiatal hernia   . Hypercholesteremia   . Lumbar herniated disc   . MI, old   . Migraine   . OSA on CPAP   . 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 Thrombocytopenia ([place])   . TIA (transient ischemic attack) ~ 2010  . Type 2 diabetes mellitus (HCC)     PSH:  Past Surgical History:  Procedure Laterality Date  . ANTERIOR CERVICAL DECOMP/DISCECTOMY FUSION  1998   "C3-4"  . CARDIAC CATHETERIZATION  "several"  . CARDIAC CATHETERIZATION N/A 12/30/2015   Procedure: Left Heart Cath and Coronary Angiography;  Surgeon: [name], MD;  Location: [facility];  Service: Cardiovascular;  Laterality: N/A;  . CARPAL TUNNEL RELEASE Bilateral 2005  . CHOLECYSTECTOMY N/A 11/28/2018   Procedure: LAPAROSCOPIC CHOLECYSTECTOMY;  Surgeon: [name], MD;  Location: [facility];  Service: General;  Laterality: N/A;  . COLONOSCOPY    . CORONARY ANGIOPLASTY    . CORONARY ANGIOPLASTY WITH STENT PLACEMENT  2002; 2003; 11/20/2014   "I have 4 stents after today" (11/20/2014)  . CORONARY ARTERY BYPASS GRAFT N/A 01/09/2016   Procedure: CORONARY ARTERY BYPASS GRAFTING (CABG) X 3 UTILIZING RIGHT AND LEFT INTERNAL  MAMMARY ARTERY AND ENDOSCOPICALLY HARVESTED SAPHENEOUS VEIN.;  Surgeon: Rexene Alberts, MD;  Location: [facility];  Service: Open Heart Surgery;  Laterality: N/A;  . ESOPHAGOGASTRODUODENOSCOPY    . KNEE SURGERY Left 02/2012   "scraped; open"  . LEFT HEART CATH AND CORS/GRAFTS ANGIOGRAPHY N/A 08/09/2017   Procedure: LEFT HEART CATH AND CORS/GRAFTS ANGIOGRAPHY;  Surgeon: [name], MD;  Location: [facility];  Service: Cardiovascular;  Laterality: N/A;  . LEFT HEART CATH AND CORS/GRAFTS ANGIOGRAPHY N/A 11/22/2018   Procedure: LEFT HEART CATH AND CORS/GRAFTS ANGIOGRAPHY;  Surgeon: [name], [name], MD;  Location: [facility];  Service: Cardiovascular;  Laterality: N/A;  . LEFT HEART CATHETERIZATION WITH CORONARY ANGIOGRAM N/A 07/20/2012   Procedure: LEFT HEART CATHETERIZATION WITH CORONARY ANGIOGRAM;  Surgeon: [name], MD;  Location: [facility];  Service: Cardiovascular;  Laterality: N/A;  .  LEFT HEART CATHETERIZATION WITH CORONARY ANGIOGRAM N/A 11/20/2014   Procedure: LEFT HEART CATHETERIZATION WITH CORONARY ANGIOGRAM;  Surgeon: [name], MD;  Location: [facility];  Service: Cardiovascular;  Laterality: N/A;  . LEFT HEART CATHETERIZATION WITH CORONARY ANGIOGRAM N/A 11/26/2014   Procedure: LEFT HEART CATHETERIZATION WITH CORONARY ANGIOGRAM;  Surgeon: [name], MD;  Location: [facility] CATH [facility];  Service: Cardiovascular;  Laterality: N/A;  . NEUROPLASTY / TRANSPOSITION ULNAR NERVE AT ELBOW Right ~ 2012  . PERCUTANEOUS CORONARY ROTOBLATOR INTERVENTION (PCI-R)  11/20/2014   Procedure: PERCUTANEOUS CORONARY ROTOBLATOR INTERVENTION (PCI-R);  Surgeon: [name], MD;  Location: [facility] CATH LAB;  Service: Cardiovascular;;  . SHOULDER ARTHROSCOPY Left ~ 2011  . TEE WITHOUT CARDIOVERSION N/A 01/09/2016   Procedure: TRANSESOPHAGEAL ECHOCARDIOGRAM (TEE);  Surgeon: Rexene Alberts, MD;  Location: [facility];  Service: Open Heart Surgery;  Laterality: N/A;    Social History:  Social History     Socioeconomic History  . Marital status: Married    Spouse name: [name]  . Number of children: 3  . Years of education: 14 th   . Highest education level: Not on file  Occupational History  . Occupation: Unemployed  Social Needs  . Financial resource strain: Not on file  . Food insecurity:    Worry: Not on file    Inability: Not on file  . Transportation needs:    Medical: Not on file    Non-medical: Not on file  Tobacco Use  . Smoking status: Never Smoker  . Smokeless tobacco: Never Used  Substance and Sexual Activity  . Alcohol use: No  . Drug use: No  . Sexual activity: Yes  Lifestyle  . Physical activity:    Days per week: Not on file    Minutes per session: Not on file  . Stress: Not on file  Relationships  . Social connections:    Talks on phone: Not on file    Gets together: Not on file    Attends religious service: Not on file    Active member of club or organization: Not on file    Attends meetings of clubs or organizations: Not on file    Relationship status: Not on file  . Intimate partner violence:    Fear of current or ex partner: Not on file    Emotionally abused: Not on file    Physically abused: Not on file    Forced sexual activity: Not on file  Other Topics Concern  . Not on file  Social History Narrative   Patient lives at home with wife [name].   Patient works at [employer], Engineer, petroleum.   Patient has a 12 th grade education.    Patient has 3 children.        Family History:  Family History  Problem Relation Age of Onset  . Coronary artery disease Father 41    Medications:   Current Facility-Administered Medications on File Prior to Visit  Medication Dose Route Frequency Provider Last Rate Last Dose  . 0.9 %  sodium chloride infusion  250 mL Intravenous PRN [name], MD      . acetaminophen (TYLENOL) tablet 650 mg  650 mg Oral Q6H PRN [name], MD   650 mg at 11/27/18 1716  . albuterol  (PROVENTIL) (2.5 MG/3ML) 0.083% nebulizer solution 2.5 mg  2.5 mg Nebulization Q6H PRN [name], MD      . amLODipine (NORVASC) tablet 5 mg  5  mg Oral QHS [name], MD   5 mg at 11/29/18 2307  . aspirin EC tablet 81 mg  81 mg Oral QHS [name], MD   81 mg at 11/29/18 2304  . citalopram (CELEXA) tablet 20 mg  20 mg Oral QHS [name], MD   20 mg at 11/29/18 2306  . docusate sodium (COLACE) capsule 100 mg  100 mg Oral BID [name], MD   100 mg at 11/29/18 2306  . ezetimibe (ZETIA) tablet 10 mg  10 mg Oral QHS [name], MD   10 mg at 11/29/18 2306  . gabapentin (NEURONTIN) capsule 300 mg  300 mg Oral TID [name], MD   300 mg at 11/29/18 2306  . insulin aspart (novoLOG) injection 0-15 Units  0-15 Units Subcutaneous TID WC Kathie Dike, MD   8 Units at 11/29/18 1656  . insulin aspart (novoLOG) injection 0-5 Units  0-5 Units Subcutaneous QHS Kathie Dike, MD   3 Units at 11/29/18 2309  . insulin glargine (LANTUS) injection 60 Units  60 Units Subcutaneous q morning - 10a [name], MD   60 Units at 11/29/18 [phone]  . insulin glargine (LANTUS) injection 70 Units  70 Units Subcutaneous QHS [name], MD   70 Units at 11/29/18 2308  . isosorbide mononitrate (IMDUR) 24 hr tablet 30 mg  30 mg Oral QHS [name], MD   30 mg at 11/29/18 2310  . loratadine (CLARITIN) tablet 10 mg  10 mg Oral Daily [name], MD   10 mg at 11/29/18 0902  . metoprolol tartrate (LOPRESSOR) tablet 50 mg  50 mg Oral QHS [name], MD   50 mg at 11/29/18 2307  . mometasone-formoterol (DULERA) 200-5 MCG/ACT inhaler 2 puff  2 puff Inhalation BID [name], MD   2 puff at 11/29/18 2006  . morphine 2 MG/ML injection 2 mg  2 mg Intravenous Q3H PRN [name], MD   2 mg at 11/29/18 1907  . nitroGLYCERIN (NITROSTAT) SL tablet 0.4 mg  0.4 mg Sublingual Q5 min PRN [name], MD      . ondansetron  [facility]) tablet 4 mg  4 mg Oral Q6H PRN [name], MD   4 mg at 11/29/18 2317   Or  . ondansetron (ZOFRAN) injection 4 mg  4 mg Intravenous Q6H PRN [name], MD      . oxyCODONE (Oxy IR/ROXICODONE) immediate release tablet 5-10 mg  5-10 mg Oral Q4H PRN [name], MD   10 mg at 11/29/18 2303  . pantoprazole (PROTONIX) EC tablet 40 mg  40 mg Oral Daily [name], MD   40 mg at 11/29/18 0902  . pravastatin (PRAVACHOL) tablet 20 mg  20 mg Oral Daily [name], MD   20 mg at 11/29/18 0902  . sodium chloride flush (NS) 0.9 % injection 3 mL  3 mL Intravenous Q12H [name], MD   3 mL at 11/29/18 2310  . sodium chloride flush (NS) 0.9 % injection 3 mL  3 mL Intravenous PRN [name], MD      . topiramate (TOPAMAX) tablet 25 mg  25 mg Oral BID [name], MD   25 mg at 11/29/18 2307   Current Outpatient Medications on File Prior to Visit  Medication Sig Dispense Refill  . albuterol (PROVENTIL HFA;VENTOLIN HFA) 108 (90 BASE) MCG/ACT inhaler Inhale 2 puffs  into the lungs every 6 (six) hours as needed for wheezing or shortness of breath.    [name] amLODipine (NORVASC) 5 MG tablet Take 5 mg by mouth at bedtime.  4  . aspirin EC 81 MG tablet Take 81 mg by mouth at bedtime.     . cetirizine (ZYRTEC) 10 MG tablet Take 10 mg by mouth at bedtime.    . citalopram (CELEXA) 20 MG tablet Take 20 mg by mouth at bedtime.     [name] ezetimibe (ZETIA) 10 MG tablet Take 10 mg by mouth at bedtime.    . fluticasone (FLONASE) 50 MCG/ACT nasal spray Place 1 spray into the nose 2 (two) times daily.     . Fluticasone-Salmeterol (ADVAIR) 250-50 MCG/DOSE AEPB Inhale 1 puff into the lungs every 12 (twelve) hours.    . furosemide (LASIX) 20 MG tablet Take 1 tablet (20 mg total) by mouth daily. 30 tablet 1  . gabapentin (NEURONTIN) 300 MG capsule Take 300 mg by mouth 3 (three) times daily.  2  . insulin aspart (NOVOLOG FLEXPEN) 100 UNIT/ML FlexPen Inject 2-30 Units into  the skin 3 (three) times daily as needed for high blood sugar (CBG >150). Per sliding scale written down at home     . isosorbide mononitrate (IMDUR) 30 MG 24 hr tablet Take 30 mg by mouth at bedtime.  4  . Melatonin 5 MG TABS Take 10 mg by mouth at bedtime.     . metFORMIN (GLUCOPHAGE) 1000 MG tablet Take 1 tablet (1,000 mg total) by mouth 2 (two) times daily. For the next 2 days as you did receive IV contrast, then resume on 11/26/2018 60 tablet 11  . metoprolol tartrate (LOPRESSOR) 50 MG tablet Take 50 mg by mouth at bedtime.    . nitroGLYCERIN (NITROSTAT) 0.4 MG SL tablet Place 1 tablet (0.4 mg total) under the tongue every 5 (five) minutes as needed for chest pain. 25 tablet 3  . omeprazole (PRILOSEC) 20 MG capsule Take 20 mg by mouth 2 (two) times daily before a meal.    . ondansetron (ZOFRAN ODT) 4 MG disintegrating tablet Take 1 tablet (4 mg total) by mouth every 8 (eight) hours as needed for up to 12 days for nausea or vomiting. 20 tablet 0  . oxyCODONE (ROXICODONE) 5 MG immediate release tablet Take 1 tablet (5 mg total) by mouth every 4 (four) hours as needed for severe pain. 12 tablet 0  . pravastatin (PRAVACHOL) 20 MG tablet Take 1 tablet (20 mg total) by mouth daily. 10 tablet 0  . topiramate (TOPAMAX) 25 MG capsule Take 25 mg by mouth 2 (two) times daily.    . TRESIBA FLEXTOUCH 100 UNIT/ML SOPN FlexTouch Pen Inject 60-70 Units into the skin See admin instructions. 60 units in the morning and 70 units at bedtime  5    Allergies:   Allergies  Allergen Reactions  . Divalproex Sodium Other (See Comments)    Causes anger  . Imdur [Isosorbide Dinitrate] Other (See Comments)    Severe headache (trying lower dose 07/05/18)  . Lipitor [Atorvastatin Calcium]     cramps  . Statins Other (See Comments)    Muscle aches and cramps  . Tramadol Other (See Comments)    Chest pain   . Tricor [Fenofibrate] Other (See Comments)    Leg cramps  . Valproic Acid Other (See Comments)    "anger"      Physical Exam  There were no vitals filed for this visit. There is no  height or weight on file to calculate BMI. No exam data present  General: well developed, well nourished, pleasant middle-aged Caucasian male, seated, in no evident distress Head: head normocephalic and atraumatic.   Neck: supple with no carotid or supraclavicular bruits Cardiovascular: regular rate and rhythm, no murmurs Musculoskeletal: no deformity Skin:  no rash/petichiae Vascular:  Normal pulses all extremities  Neurologic Exam Mental Status: Awake and fully alert. Oriented to place and time. Recent and remote memory intact. Attention span, concentration and fund of knowledge appropriate. Mood and affect appropriate.  Cranial Nerves: Fundoscopic exam reveals sharp disc margins. Pupils equal, briskly reactive to light. Extraocular movements full without nystagmus. Visual fields full to confrontation. Hearing intact. Facial sensation intact. Face, tongue, palate moves normally and symmetrically.  Motor: Normal bulk and tone. Normal strength in all tested extremity muscles. Sensory.: intact to touch , pinprick , position and vibratory sensation.  Coordination: Rapid alternating movements normal in all extremities. Finger-to-nose and heel-to-shin performed accurately bilaterally. Gait and Station: Arises from chair without difficulty. Stance is normal. Gait demonstrates normal stride length and balance . Able to heel, toe and tandem walk without difficulty.  Reflexes: 1+ and symmetric. Toes downgoing.    NIHSS  0 Modified Rankin  1 (subjective blurry vision)    Diagnostic Data (Labs, Imaging, Testing)  CTA head/neck ([facility])  calcified atherosclerotic disease at the carotid bifurcation w/o hemodynamically significant stenosis. No dissection or aneurysm. Intracranial arteries and veins are patent and unremarkable.   CT Head ([facility])  no acute intracranial abnormality.   MR BRAIN WO CONTRAST MR MRA HEAD WO  CONTRAST 07/09/2018 IMPRESSION: Negative for acute infarct or hemorrhage. Mild chronic microvascular ischemia in the white matter Negative MRA head   Transthoracic Echocardiogram  07/09/2018 Study Conclusions - Left ventricle: The cavity size was normal. Wall thickness was increased in a pattern of mild LVH. Systolic function was normal. The estimated ejection fraction was in the range of 60% to 65%. Wall motion was normal; there were no regional wall motion abnormalities. Left ventricular diastolic function parameters were normal for the patient&'s age. - Aortic valve: Mildly calcified annulus. Trileaflet; mildly calcified leaflets. There was mild regurgitation. - Mitral valve: There was trivial regurgitation. - Atrial septum: No defect or patent foramen ovale was identified. - Tricuspid valve: There was trivial regurgitation. - Pericardium, extracardiac: There was no pericardial effusion.  Bilateral Carotid Dopplers  07/10/2018 Final Interpretation: Right Carotid: Velocities in the right ICA are consistent with a 1-39% stenosis. Left Carotid: Velocities in the left ICA are consistent with a 1-39% stenosis. Vertebrals: Bilateral vertebral arteries demonstrate antegrade flow. Right vertebral artery demonstrates high resistant flow.  EEG 07/10/2018 Impression The EEG isabnormalare findings are suggestive of mild generalized cerebral dysfunction.Epileptiform features were not seen during this recording.   ASSESSMENT: [name] [name] is a 63 y.o. year old male here with strokelike episode S/P IV TPA on 07/08/2018. Vascular risk factors include HTN, HLD, DM, OSA, CAD and CHF.  Patient is being seen today for hospital follow-up and he continues to have mild blurry vision with intermittent cognitive difficulties but both have been improving and denies any new or worsening stroke/TIA symptoms.    PLAN: -Continue aspirin 81 mg daily  and pravastatin and Zetia for secondary  stroke prevention -F/u with PCP regarding your HLD, HTN and DM management -BLE edema -advised patient to schedule appoint with cardiologist in regards to concerns of increased bilateral lower extremity edema.  In the meantime, recommended use of compression stockings, elevation  of legs and decrease sodium intake -Referral placed to [facility] for establishment for OSA provider -Advised to schedule appointment with ophthalmology for routine follow-up along with continued monitoring of blurry vision complaints -continue to monitor BP at home -advised to continue to stay active and maintain a healthy diet -Maintain strict control of hypertension with blood pressure goal below 130/90, diabetes with hemoglobin A1c goal below 6.5% and cholesterol with LDL cholesterol (bad cholesterol) goal below 70 mg/dL. I also advised the patient to eat a healthy diet with plenty of whole grains, cereals, fruits and vegetables, exercise regularly and maintain ideal body weight.  Follow up in 3 months or call earlier if needed   Greater than 50% of time during this 25 minute visit was spent on counseling,explanation of diagnosis of strokelike episode, reviewing risk factor management of HTN, HLD, DM, CAD, CHF and OSA, planning of further management, discussion with patient and family and coordination of care    Venancio Poisson, AGNP-BC  [facility] [address] [place], [place] [address]  Phone [phone] Fax [phone] Note: This document was prepared with digital dictation and possible smart phrase technology. Any transcriptional errors that result from this process are unintentional.

## 2018-11-30 NOTE — Discharge Instructions (Signed)
Discharge Laparoscopic Surgery Instructions:  Common Complaints: Right shoulder pain is common after laparoscopic surgery.  This is secondary to the gas used in the surgery being trapped under the diaphragm.  Walk to help your body absorb the gas. This will improve in a few days. Pain at the port sites are common, especially the larger port sites. This will improve with time.  Some nausea is common and poor appetite. The main goal is to stay hydrated the first few days after surgery.   Diet/ Activity: Diet as tolerated. You may not have an appetite, but it is important to stay hydrated. Drink 64 ounces of water a day. Your appetite will return with time.  Shower per your regular routine daily.  Do not take hot showers. Take warm showers that are less than 10 minutes. Rest and listen to your body, but do not remain in bed all day.  Walk everyday for at least 15-20 minutes. Deep cough and move around every 1-2 hours in the first few days after surgery.  Do not lift > 10 lbs, perform excessive bending, pushing, pulling, squatting for 1-2 weeks after surgery.  Do not pick at the dermabond glue on your incision sites.  This glue film will remain in place for 1-2 weeks and will start to peel off.  Do not place lotions or balms on your incision unless instructed to specifically by Dr. Constance Haw.   [place] Please keep the drain clean and dry. Replace the gauze/ tape around the drain if it gets dirty or wet/ saturated. Please do not mess with or cut the stitch that is keeping the drain in place. Secure the drain to your clothes so that it does not get dislodged.  You may want to wear a binder around your abdomen (girdle) at night for sleeping if you are worried about it getting pulled out.  Please record the output from the drain daily including the color and the amount in milliliters.  Please flush the drain as instructed by your nurse. Please keep the drain covered with plastic and tape when  you shower so that it does not get wet.   Medication: Take tylenol and ibuprofen as needed for pain control, alternating every 4-6 hours.  Example:  Tylenol 1011m @ 6am, 12noon, 6pm, 167mnight (Do not exceed 40009mf tylenol a day). Ibuprofen 800m42m9am, 3pm, 9pm, 3am (Do not exceed 3600mg38mibuprofen a day).  Take Roxicodone for breakthrough pain every 4 hours.  Take Colace for constipation related to narcotic pain medication. If you do not have a bowel movement in 2 days, take Miralax over the counter.  Drink plenty of water to also prevent constipation.   Contact Information: If you have questions or concerns, please call our office, [phone]- Thursday 8AM-5PM and Friday 8AM-12Noon.  If it is after hours or on the weekend, please call [facility] Main [facility], [phone] ask to speak to the surgeon on call for Dr. BridgConstance HawnnieNortheast [facility] After This sheet gives you information about how to care for yourself after your procedure. Your doctor may also give you more specific instructions. If you have problems or questions, contact your doctor. Follow these instructions at home: Care for cuts from surgery (incisions)   Follow instructions from your doctor about how to take care of your cuts from surgery. Make sure you: ? Wash your hands with soap and water before you change your bandage (dressing). If you cannot  use soap and water, use hand sanitizer. ? Change your bandage as told by your doctor. ? Leave stitches (sutures), skin glue, or skin tape (adhesive) strips in place. They may need to stay in place for 2 weeks or longer. If tape strips get loose and curl up, you may trim the loose edges. Do not remove tape strips completely unless your doctor says it is okay.  Do not take baths, swim, or use a hot tub until your doctor says it is okay. You may shower.  Check your surgical cut area every day for signs of infection. Check  for: ? More redness, swelling, or pain. ? More fluid or blood. ? Warmth. ? Pus or a bad smell. Activity  Do not drive or use heavy machinery while taking prescription pain medicine.  Do not lift anything that is heavier than 10 lb (4.5 kg) until your doctor says it is okay.  Do not play contact sports until your doctor says it is okay.  Do not drive for 24 hours if you were given a medicine to help you relax (sedative).  Rest as needed. Do not return to work or school until your doctor says it is okay. General instructions  Take over-the-counter and prescription medicines only as told by your doctor.  To prevent or treat constipation while you are taking prescription pain medicine, your doctor may recommend that you: ? Drink enough fluid to keep your pee (urine) clear or pale yellow. ? Take over-the-counter or prescription medicines. ? Eat foods that are high in fiber, such as fresh fruits and vegetables, whole grains, and beans. ? Limit foods that are high in fat and processed sugars, such as fried and sweet foods. Contact a doctor if:  You develop a rash.  You have more redness, swelling, or pain around your surgical cuts.  You have more fluid or blood coming from your surgical cuts.  Your surgical cuts feel warm to the touch.  You have pus or a bad smell coming from your surgical cuts.  You have a fever.  One or more of your surgical cuts breaks open. Get help right away if:  You have trouble breathing.  You have chest pain.  You have pain that is getting worse in your shoulders.  You faint or feel dizzy when you stand.  You have very bad pain in your belly (abdomen).  You are sick to your stomach (nauseous) for more than one day.  You have throwing up (vomiting) that lasts for more than one day.  You have leg pain. This information is not intended to replace advice given to you by your health care provider. Make sure you discuss any questions you have with  your health care provider. Document Released: 08/04/2008 Document Revised: 05/16/2016 Document Reviewed: 04/13/2016 Elsevier Interactive Patient Education  2019 [employer].

## 2018-11-30 NOTE — Progress Notes (Signed)
[facility] Progress Note  2 Days Post-Op  Subjective: Tolerating diet. Good pain control. JP with continued SS output. Pathology with cirrhosis and gangrenous gallbladder.   Objective: Vital signs in last 24 hours: Temp:  [97.7 F (36.5 C)-98.9 F (37.2 C)] 97.7 F (36.5 C) (01/22 0555) Pulse Rate:  [64-83] 64 (01/22 0555) Resp:  [16-20] 18 (01/22 0555) BP: (113-144)/(68-71) 113/71 (01/22 0555) SpO2:  [90 %-97 %] 94 % (01/22 0929) Last BM Date: 11/25/18  Intake/Output from previous day: 01/21 0701 - 01/22 0700 In: 246 [P.O.:240; I.V.:6] Out: 630 [Urine:500; Drains:130] Intake/Output this shift: No intake/output data recorded.  General appearance: alert, cooperative and no distress Resp: normal work breathing GI: soft, mildly distended, appropriately tender, dermabond c/d/i with no erythema or drainage, drain with SS output, no suds no bile  Lab Results:  Recent Labs    11/29/18 0419 11/30/18 0604  WBC 5.0 4.7  HGB 12.0* 11.1*  HCT 36.2* 33.5*  PLT 112* 132*   BMET Recent Labs    11/29/18 0419 11/30/18 0604  NA 136 137  K 4.3 3.7  CL 103 104  CO2 24 26  GLUCOSE 259* 219*  BUN 16 22*  CREATININE 1.02 1.13  CALCIUM 8.2* 8.1*    Anti-infectives: Anti-infectives (From admission, onward)   Start     Dose/Rate Route Frequency Ordered Stop   11/28/18 0600  cefoTEtan (CEFOTAN) 2 g in sodium chloride 0.9 % 100 mL IVPB     2 g 200 mL/hr over 30 Minutes Intravenous On call to O.R. 11/27/18 2102 11/28/18 2029   11/27/18 1000  metroNIDAZOLE (FLAGYL) IVPB 500 mg  Status:  Discontinued     500 mg 100 mL/hr over 60 Minutes Intravenous Every 8 hours 11/27/18 0920 11/28/18 1500   11/26/18 1100  cefTRIAXone (ROCEPHIN) 2 g in sodium chloride 0.9 % 100 mL IVPB  Status:  Discontinued     2 g 200 mL/hr over 30 Minutes Intravenous Every 24 hours 11/26/18 1012 11/28/18 1500      Assessment/Plan: Mr. Heinz is a 63 yo POD 2 s/p lap chole and liver biopsy  doing fair. Eating and pain controlled. Can follow up with me Tuesday to get drain out: wife taught drain care and documentation for amount out Diet and activity was tolerated GI for the cirrhosis as outpatient. Dr Wynetta Emery spoke with Dr. Laural Golden.    LOS: 2 days    [name] 11/30/2018

## 2018-11-30 NOTE — Care Management Note (Signed)
Case Management Note  Patient Details  Name: [name] [name] MRN: [id number] Date of Birth: 01/10/1956  Subjective/Objective:    Admitted s/p lap chole with liver biopsy with cirrhosis. Pt from home, ind with ADL's. Has insurance, PCP and hospital follow up. Pt high risk for readmission d/t number of Rx and # of admissions. Pt has had a medically complex year. HH not indicated at this time per this CM.                  Action/Plan:  DC home today with self care. Pt has close hospital follow up. No further f/u   Expected Discharge Date:      11/30/2018            Expected Discharge Plan:  Home/Self Care  In-House Referral:  NA  Discharge planning Services  NA  Post Acute Care Choice:  NA Choice offered to:  NA  Status of Service:  Completed, signed off  If discussed at Long Length of Stay Meetings, dates discussed:    Additional Comments:  Sherald Barge, RN 11/30/2018, 4:32 PM

## 2018-11-30 NOTE — Discharge Summary (Signed)
Physician Discharge Summary  [name] [name] ZTI:[id number] DOB: 1956-09-11 DOA: 11/25/2018  PCP: [name] Rhodes, MD Surgeon: Dr. Constance [name]  Admit date: 11/25/2018 Discharge date: 11/30/2018  Admitted From: Home  Disposition: Home   Recommendations for Outpatient Follow-up:  1. Follow up with Dr. Constance [name] on Tues as scheduled 2. Follow up with Dr. Laural [name] in 1 month to establish care for liver disease 3. Follow up with PCP in 1-2 weeks for recheck hospital follow up   Discharge Condition: STABLE   CODE STATUS: FULL    Brief Hospitalization Summary: Please see all hospital notes, images, labs for full details of the hospitalization.  HPI:  per Dr. Tommie [name] [name] [name]  is a 63 y.o. male, with a history of CAD status post PCI and CABG, type 2 diabetes mellitus, hypertension, hyperlipidemia, TIA, obstructive sleep apnea came to ED with chief complaint of abdominal pain.  Patient was recently discharged from the hospital on 15th January after he had cardiac cath.  Patient was found to have heart failure with pEF during cardiac cath.  He was prescribed Lasix but patient's daughter says that she could not get Lasix filled. Today at the PCP office, patient was found to have high fever 102 F and was sent to ED for further evaluation.  In the ED patient had one reading of  low-grade temperature 100.2 otherwise has been afebrile. In the ED patient was found to be mildly hypoxic.  Requiring oxygen via nasal cannula 2 L/min.  Patient has been having shortness of breath, also right upper quadrant pain. He was seen in the ED yesterday underwent CTA of the chest which was negative for pulmonary embolism Abdominal ultrasound showed no cholecystitis. Denies chest pain Denies dysuria No nausea vomiting or diarrhea.  Brief Narrative:  63 year old male who was recently in the hospital for left-sided chest pain underwent coronary angiography.  He was discharged to the hospital after catheterization showed  stable coronary artery disease with recommendations for medical management.  He returned to the hospital with 2 days of persistent right upper quadrant pain, vomiting and fever.  Imaging of gallbladder has been inconclusive, but clinically there is concern for cholecystitis.  HIDA scan has been ordered and he is on intravenous antibiotics.  General surgery following.  Assessment & Plan:   Active Problems:   Mixed hyperlipidemia   Essential hypertension, benign   Type 2 diabetes mellitus (HCC)   Fever   Thrombocytopenia (HCC)   NASH (nonalcoholic steatohepatitis)   Acute diastolic CHF (congestive heart failure) (HCC)   Right upper quadrant abdominal pain   Acute respiratory failure with hypoxia (HCC)   RUQ abdominal pain   Calculus of gallbladder with acute cholecystitis without obstruction   Fatty liver  1. Acute cholecystitis.  Seen by general surgery and underwent cholecystectomy on 1/20.  JP drain placed.  Per surgery, he does not need further IV antibiotics.  Pt has done well post op.  He has tolerated his diet.  He will follow up with Dr. Constance [name] on Tues to have drain removed.  His biopsy confirmed liver cirrhosis.  I spoke with Dr. Laural [name] who will see him on outpatient basis.   2. Diabetes type 2.  Metformin currently on hold.  He is continued on basal insulin.  Continue sliding scale insulin.  Blood sugars been stable.   3. Coronary artery disease.  Status post CABG.  Had cardiac catheterization earlier last week.  Found to have severe three-vessel disease, but no significant changes from prior cath.  Recommendations were for continued medical management. 4. Obstructive sleep apnea. 5. Hypertension.  Blood pressure currently stable.  Continue on metoprolol. 6. Thrombocytopenia.  Felt to be secondary to underlying liver disease.  Currently stable.  Continue to monitor. 7. Liver Cirrhosis and NASH - Patient noted to have a history of fatty liver.  Patient also had liver biopsy  performed during cholecystectomy. Biopsy confirms liver cirrhosis stage 4.  I spoke with Dr. Laural [name] who will see him outpatient to follow up.   8. Acute kidney injury.  Baseline creatinine 0.8.  Creatinine has trended up to 1.5.  He had received a dose of intravenous Lasix on admission.  Creatinine is improved.   DVT prophylaxis: SCDs Code Status: Full code Family Communication: Discussed with patient and wife at the bedside Disposition Plan: Discharge home  Consultants:   General surgery  Procedures:   Cholecystectomy with liver biopsy  Antimicrobials:   Ceftriaxone 1/18 > 1/20  Flagyl 1/19 > 1/20  Discharge Diagnoses:  Active Problems:   Mixed hyperlipidemia   Essential hypertension, benign   Type 2 diabetes mellitus (HCC)   Fever   Thrombocytopenia (HCC)   NASH (nonalcoholic steatohepatitis)   Acute diastolic CHF (congestive heart failure) (HCC)   Right upper quadrant abdominal pain   Acute respiratory failure with hypoxia (HCC)   RUQ abdominal pain   Calculus of gallbladder with acute cholecystitis without obstruction   Fatty liver  Discharge Instructions: Discharge Instructions    Call MD for:  persistant nausea and vomiting   Complete by:  As directed    Call MD for:  redness, tenderness, or signs of infection (pain, swelling, redness, odor or green/yellow discharge around incision site)   Complete by:  As directed    Call MD for:  severe uncontrolled pain   Complete by:  As directed    Diet - low sodium heart healthy   Complete by:  As directed    Increase activity slowly   Complete by:  As directed      Allergies as of 11/30/2018      Reactions   Divalproex Sodium Other (See Comments)   Causes anger   Imdur [isosorbide Dinitrate] Other (See Comments)   Severe headache (trying lower dose 07/05/18)   Lipitor [atorvastatin Calcium]    cramps   Statins Other (See Comments)   Muscle aches and cramps   Tramadol Other (See Comments)   Chest pain     Tricor [fenofibrate] Other (See Comments)   Leg cramps   Valproic Acid Other (See Comments)   "anger"      Medication List    TAKE these medications   albuterol 108 (90 Base) MCG/ACT inhaler Commonly known as:  PROVENTIL HFA;VENTOLIN HFA Inhale 2 puffs into the lungs every 6 (six) hours as needed for wheezing or shortness of breath.   amLODipine 5 MG tablet Commonly known as:  NORVASC Take 5 mg by mouth at bedtime.   aspirin EC 81 MG tablet Take 81 mg by mouth at bedtime.   cetirizine 10 MG tablet Commonly known as:  ZYRTEC Take 10 mg by mouth at bedtime.   citalopram 20 MG tablet Commonly known as:  CELEXA Take 20 mg by mouth at bedtime.   docusate sodium 100 MG capsule Commonly known as:  COLACE Take 1 capsule (100 mg total) by mouth 2 (two) times daily.   ezetimibe 10 MG tablet Commonly known as:  ZETIA Take 10 mg by mouth at bedtime.   fluticasone 50 MCG/ACT nasal  spray Commonly known as:  FLONASE Place 1 spray into the nose 2 (two) times daily.   Fluticasone-Salmeterol 250-50 MCG/DOSE Aepb Commonly known as:  ADVAIR Inhale 1 puff into the lungs every 12 (twelve) hours.   furosemide 20 MG tablet Commonly known as:  LASIX Take 1 tablet (20 mg total) by mouth daily.   gabapentin 300 MG capsule Commonly known as:  NEURONTIN Take 300 mg by mouth 3 (three) times daily.   isosorbide mononitrate 30 MG 24 hr tablet Commonly known as:  IMDUR Take 30 mg by mouth at bedtime.   Melatonin 5 MG Tabs Take 10 mg by mouth at bedtime.   metFORMIN 1000 MG tablet Commonly known as:  GLUCOPHAGE Take 1 tablet (1,000 mg total) by mouth 2 (two) times daily. For the next 2 days as you did receive IV contrast, then resume on 11/26/2018   metoprolol tartrate 50 MG tablet Commonly known as:  LOPRESSOR Take 50 mg by mouth at bedtime.   nitroGLYCERIN 0.4 MG SL tablet Commonly known as:  NITROSTAT Place 1 tablet (0.4 mg total) under the tongue every 5 (five) minutes as needed  for chest pain.   NOVOLOG FLEXPEN 100 UNIT/ML FlexPen Generic drug:  insulin aspart Inject 2-30 Units into the skin 3 (three) times daily as needed for high blood sugar (CBG >150). Per sliding scale written down at home   omeprazole 20 MG capsule Commonly known as:  PRILOSEC Take 20 mg by mouth 2 (two) times daily before a meal.   ondansetron 4 MG disintegrating tablet Commonly known as:  ZOFRAN ODT Take 1 tablet (4 mg total) by mouth every 8 (eight) hours as needed for up to 12 days for nausea or vomiting.   oxyCODONE 5 MG immediate release tablet Commonly known as:  ROXICODONE Take 1 tablet (5 mg total) by mouth every 4 (four) hours as needed for severe pain.   pravastatin 20 MG tablet Commonly known as:  PRAVACHOL Take 1 tablet (20 mg total) by mouth daily.   topiramate 25 MG capsule Commonly known as:  TOPAMAX Take 25 mg by mouth 2 (two) times daily.   TRESIBA FLEXTOUCH 100 UNIT/ML Sopn FlexTouch Pen Generic drug:  insulin degludec Inject 60-70 Units into the skin See admin instructions. 60 units in the morning and 70 units at bedtime      Follow-up Information    [name], MD Follow up on 12/06/2018.   Specialty:  General Surgery Why:  JP drain removal  Contact information: [address] Linna Hoff [facility] [address] [phone]        [name] Rhodes, MD. Schedule an appointment as soon as possible for a visit in 2 week(s).   Contact information: [place] [address] [phone]        [name], MD .   Specialty:  Cardiology Contact information: [place] [address] [phone]        Rogene Houston, MD. Schedule an appointment as soon as possible for a visit in 1 month(s).   Specialty:  Gastroenterology Why:  Establish Care for liver disease Contact information: [place], [address] [place] [address] [phone]          Allergies  Allergen Reactions  . Divalproex  Sodium Other (See Comments)    Causes anger  . Imdur [Isosorbide Dinitrate] Other (See Comments)    Severe headache (trying lower dose 07/05/18)  . Lipitor [Atorvastatin Calcium]     cramps  .  Statins Other (See Comments)    Muscle aches and cramps  . Tramadol Other (See Comments)    Chest pain   . Tricor [Fenofibrate] Other (See Comments)    Leg cramps  . Valproic Acid Other (See Comments)    "anger"   Allergies as of 11/30/2018      Reactions   Divalproex Sodium Other (See Comments)   Causes anger   Imdur [isosorbide Dinitrate] Other (See Comments)   Severe headache (trying lower dose 07/05/18)   Lipitor [atorvastatin Calcium]    cramps   Statins Other (See Comments)   Muscle aches and cramps   Tramadol Other (See Comments)   Chest pain    Tricor [fenofibrate] Other (See Comments)   Leg cramps   Valproic Acid Other (See Comments)   "anger"      Medication List    TAKE these medications   albuterol 108 (90 Base) MCG/ACT inhaler Commonly known as:  PROVENTIL HFA;VENTOLIN HFA Inhale 2 puffs into the lungs every 6 (six) hours as needed for wheezing or shortness of breath.   amLODipine 5 MG tablet Commonly known as:  NORVASC Take 5 mg by mouth at bedtime.   aspirin EC 81 MG tablet Take 81 mg by mouth at bedtime.   cetirizine 10 MG tablet Commonly known as:  ZYRTEC Take 10 mg by mouth at bedtime.   citalopram 20 MG tablet Commonly known as:  CELEXA Take 20 mg by mouth at bedtime.   docusate sodium 100 MG capsule Commonly known as:  COLACE Take 1 capsule (100 mg total) by mouth 2 (two) times daily.   ezetimibe 10 MG tablet Commonly known as:  ZETIA Take 10 mg by mouth at bedtime.   fluticasone 50 MCG/ACT nasal spray Commonly known as:  FLONASE Place 1 spray into the nose 2 (two) times daily.   Fluticasone-Salmeterol 250-50 MCG/DOSE Aepb Commonly known as:  ADVAIR Inhale 1 puff into the lungs every 12 (twelve) hours.   furosemide 20 MG tablet Commonly  known as:  LASIX Take 1 tablet (20 mg total) by mouth daily.   gabapentin 300 MG capsule Commonly known as:  NEURONTIN Take 300 mg by mouth 3 (three) times daily.   isosorbide mononitrate 30 MG 24 hr tablet Commonly known as:  IMDUR Take 30 mg by mouth at bedtime.   Melatonin 5 MG Tabs Take 10 mg by mouth at bedtime.   metFORMIN 1000 MG tablet Commonly known as:  GLUCOPHAGE Take 1 tablet (1,000 mg total) by mouth 2 (two) times daily. For the next 2 days as you did receive IV contrast, then resume on 11/26/2018   metoprolol tartrate 50 MG tablet Commonly known as:  LOPRESSOR Take 50 mg by mouth at bedtime.   nitroGLYCERIN 0.4 MG SL tablet Commonly known as:  NITROSTAT Place 1 tablet (0.4 mg total) under the tongue every 5 (five) minutes as needed for chest pain.   NOVOLOG FLEXPEN 100 UNIT/ML FlexPen Generic drug:  insulin aspart Inject 2-30 Units into the skin 3 (three) times daily as needed for high blood sugar (CBG >150). Per sliding scale written down at home   omeprazole 20 MG capsule Commonly known as:  PRILOSEC Take 20 mg by mouth 2 (two) times daily before a meal.   ondansetron 4 MG disintegrating tablet Commonly known as:  ZOFRAN ODT Take 1 tablet (4 mg total) by mouth every 8 (eight) hours as needed for up to 12 days for nausea or vomiting.   oxyCODONE 5 MG  immediate release tablet Commonly known as:  ROXICODONE Take 1 tablet (5 mg total) by mouth every 4 (four) hours as needed for severe pain.   pravastatin 20 MG tablet Commonly known as:  PRAVACHOL Take 1 tablet (20 mg total) by mouth daily.   topiramate 25 MG capsule Commonly known as:  TOPAMAX Take 25 mg by mouth 2 (two) times daily.   TRESIBA FLEXTOUCH 100 UNIT/ML Sopn FlexTouch Pen Generic drug:  insulin degludec Inject 60-70 Units into the skin See admin instructions. 60 units in the morning and 70 units at bedtime       Procedures/Studies: Ct Abdomen Pelvis Wo Contrast  Result Date:  11/23/2018 CLINICAL DATA:  63 year old male with epigastric pain. Recent heart catheterization yesterday. EXAM: CT ABDOMEN AND PELVIS WITHOUT CONTRAST TECHNIQUE: Multidetector CT imaging of the abdomen and pelvis was performed following the standard protocol without IV contrast. COMPARISON:  Abdominal ultrasound performed yesterday 11/22/2018 FINDINGS: Lower chest: The visualized cardiac structures are within normal limits for size. No pericardial effusion. Atherosclerotic vascular calcifications. Patient is status post median sternotomy. Unremarkable distal esophagus. The lung bases are clear. Hepatobiliary: Low attenuation of the hepatic parenchyma consistent with steatosis. Relative hypertrophy of the caudate and left hepatic lobe consistent with cirrhotic change. Gallbladder is unremarkable. No intra or extrahepatic biliary ductal dilatation. Pancreas: Unremarkable. No pancreatic ductal dilatation or surrounding inflammatory changes. Spleen: Normal in size without focal abnormality. Adrenals/Urinary Tract: Unremarkable adrenal glands. No evidence of hydronephrosis or nephrolithiasis. Unremarkable ureters and bladder. Stomach/Bowel: No evidence of obstruction or focal bowel wall thickening. Normal appendix in the right lower quadrant. The terminal ileum is unremarkable. Vascular/Lymphatic: Limited evaluation in the absence of intravenous contrast. Scattered atherosclerotic vascular calcifications throughout the abdominal aorta. No evidence of aneurysm. Reproductive: Prostate is unremarkable. Other: No abdominal wall hernia or abnormality. No abdominopelvic ascites. Musculoskeletal: No acute fracture or aggressive appearing lytic or blastic osseous lesion. IMPRESSION: 1. No acute abnormality within the abdomen or pelvis. 2. Hepatic steatosis with evidence of hepatic cirrhosis. 3.  Aortic Atherosclerosis (ICD10-170.0) Electronically Signed   By: Jacqulynn Cadet M.D.   On: 11/23/2018 21:59   Dg Chest 2  View  Result Date: 11/25/2018 CLINICAL DATA:  63 year old male with shortness of breath. Possible acute cholecystitis based on CTA chest yesterday. EXAM: CHEST - 2 VIEW COMPARISON:  11/24/2018 and earlier. FINDINGS: Low lung volumes with bibasilar atelectasis. Difficult to exclude small pleural effusions, which would be new since yesterday. No pneumothorax, pulmonary edema or consolidation. Normal cardiac size and mediastinal contours. Prior CABG. No acute osseous abnormality identified. Negative visible bowel gas pattern. IMPRESSION: Low lung volumes with bibasilar atelectasis. Questionable new small pleural effusions. Electronically Signed   By: Genevie Ann M.D.   On: 11/25/2018 20:38   Ct Angio Chest Pe W/cm &/or Wo Cm  Result Date: 11/24/2018 CLINICAL DATA:  Shortness of breath. EXAM: CT ANGIOGRAPHY CHEST WITH CONTRAST TECHNIQUE: Multidetector CT imaging of the chest was performed using the standard protocol during bolus administration of intravenous contrast. Multiplanar CT image reconstructions and MIPs were obtained to evaluate the vascular anatomy. CONTRAST:  42m ISOVUE-370 IOPAMIDOL (ISOVUE-370) INJECTION 76% COMPARISON:  08/13/2017 FINDINGS: Cardiovascular: The heart size is normal. No substantial pericardial effusion. Coronary artery calcification is evident. Status post CABG. No thoracic aortic aneurysm. Assessment of pulmonary arteries is limited by respiratory motion. Some segmental and subsegmental branches have linear low-density in the lumen (see central right lung on 57/5) but this is felt to be more likely related to artifact from  the breathing motion and pulmonary embolus. Mediastinum/Nodes: No mediastinal lymphadenopathy. There is no hilar lymphadenopathy. The esophagus has normal imaging features. There is no axillary lymphadenopathy. Lungs/Pleura: No suspicious pulmonary nodule or mass. No focal airspace consolidation. Dependent atelectasis noted in the lungs bilaterally. No pleural  effusion. Upper Abdomen: Gallbladder is distended with ill-defined gallbladder wall and some apparent pericholecystic edema.Subtle nodularity of liver contour with large lateral segment left liver and caudate lobe is suggestive of cirrhosis. Musculoskeletal: No worrisome lytic or sclerotic osseous abnormality. Review of the MIP images confirms the above findings. IMPRESSION: 1. No CT evidence for acute pulmonary embolus although assessment of segmental and subsegmental pulmonary arteries is limited by respiratory motion. 2. No focal airspace consolidation or pleural effusion. 3. Distended gallbladder with ill-defined gallbladder wall and some apparent pericholecystic edema. Acute cholecystitis is a concern. Abdominal ultrasound may prove helpful to further evaluate. 4. Liver morphology suggestive of cirrhosis. Electronically Signed   By: Misty Stanley M.D.   On: 11/24/2018 19:03   Nm Hepatobiliary Liver Func  Result Date: 11/28/2018 CLINICAL DATA:  Right upper quadrant abdominal pain and nausea for 1 week. The gallbladder was distended with no visible gallstones on a recent ultrasound. EXAM: NUCLEAR MEDICINE HEPATOBILIARY IMAGING TECHNIQUE: Sequential images of the abdomen were obtained out to 60 minutes following intravenous administration of radiopharmaceutical. RADIOPHARMACEUTICALS:  4.75 mCi Tc-25m Choletec IV COMPARISON:  Right upper quadrant abdomen ultrasound dated 11/24/2018 and abdomen and pelvis CT dated 11/23/2018. FINDINGS: Prompt uptake and biliary excretion of activity by the liver is seen. Biliary activity passes into small bowel, consistent with patent common bile duct. The gallbladder was not visualized up to 90 minutes post injection despite the intravenous administration of 4 mg of morphine. IMPRESSION: Nonvisualized gallbladder, compatible with cystic duct obstruction. Electronically Signed   By: SClaudie ReveringM.D.   On: 11/28/2018 10:08   UKoreaAbdomen Complete  Result Date:  11/22/2018 CLINICAL DATA:  Pancytopenia. Evaluate for cirrhosis and splenomegaly EXAM: ABDOMEN ULTRASOUND COMPLETE COMPARISON:  12/29/2012 FINDINGS: Gallbladder: No gallstones or wall thickening visualized. No sonographic Murphy sign noted by sonographer. Common bile duct: Diameter: Normal caliber, 5 mm Liver: Increased echotexture compatible with fatty infiltration. No focal abnormality or biliary ductal dilatation. Portal vein is patent on color Doppler imaging with normal direction of blood flow towards the liver. IVC: No abnormality visualized. Pancreas: Not well visualized due to overlying bowel gas. Spleen: Mild splenomegaly. Craniocaudal length is upper limits of normal at 12.3 cm, but elevated volume at 517 mL. No focal abnormality. Right Kidney: Length: 11.7 cm. Echogenicity within normal limits. No mass or hydronephrosis visualized. Left Kidney: Length: 11.9 cm. Echogenicity within normal limits. No mass or hydronephrosis visualized. Abdominal aorta: No aneurysm visualized. Other findings: None. IMPRESSION: Fatty infiltration of the liver. Borderline splenomegaly. Electronically Signed   By: KRolm BaptiseM.D.   On: 11/22/2018 09:08   UKoreaAbdomen Limited  Result Date: 11/24/2018 CLINICAL DATA:  RIGHT upper quadrant pain for 2 days with nausea; history hypertension, CHF, diabetes mellitus EXAM: ULTRASOUND ABDOMEN LIMITED RIGHT UPPER QUADRANT COMPARISON:  CT abdomen and pelvis 11/23/2018 FINDINGS: Gallbladder: Distended without stones or wall thickening. No pericholecystic fluid or sonographic Murphy sign. Common bile duct: Diameter: 3 mm diameter, normal Liver: Echogenic parenchyma, likely fatty infiltration though this can be seen with cirrhosis and certain infiltrative disorders. No focal hepatic mass or nodularity. Portal vein is patent on color Doppler imaging with normal direction of blood flow towards the liver. Trace perihepatic ascites. IMPRESSION: Question  fatty infiltration of liver as above.  Trace perihepatic ascites. Otherwise negative exam. Electronically Signed   By: Lavonia Dana M.D.   On: 11/24/2018 20:55   Dg Chest Port 1 View  Result Date: 11/20/2018 CLINICAL DATA:  Chest pain EXAM: PORTABLE CHEST 1 VIEW COMPARISON:  10/12/2017 FINDINGS: 1226 hours. The lungs are clear without focal pneumonia, edema, pneumothorax or pleural effusion. The cardiopericardial silhouette is within normal limits for size. Status post CABG. The visualized bony structures of the thorax are intact. Telemetry leads overlie the chest. IMPRESSION: No active disease. Electronically Signed   By: Misty Stanley M.D.   On: 11/20/2018 15:58      Subjective: Pt tolerating diet well.  Pain is well controlled.  No complaints.  He wants to go home.  He will follow up with surgery and GI outpatient.   Discharge Exam: Vitals:   11/30/18 0929 11/30/18 1500  BP:  (!) 142/77  Pulse:  70  Resp:  20  Temp:  98.4 F (36.9 C)  SpO2: 94% 96%   Vitals:   11/29/18 2330 11/30/18 0555 11/30/18 0929 11/30/18 1500  BP:  113/71  (!) 142/77  Pulse: 82 64  70  Resp: 16 18  20   Temp:  97.7 F (36.5 C)  98.4 F (36.9 C)  TempSrc:  Oral  Oral  SpO2: 95% 97% 94% 96%  Weight:      Height:       General exam: Alert, awake, oriented x 3 Respiratory system: Clear to auscultation. Respiratory effort normal. Cardiovascular system: normal s1.s2 sounds.  No murmurs, rubs, gallops. Gastrointestinal system: Abdomen is nondistended, soft and right upper quadrant tenderness. No organomegaly or masses felt. Normal bowel sounds heard. Central nervous system: Alert and oriented. No focal neurological deficits. Extremities: No C/C/E, +pedal pulses Skin: No rashes, lesions or ulcers Psychiatry: Judgement and insight appear normal. Mood & affect appropriate.   The results of significant diagnostics from this hospitalization (including imaging, microbiology, ancillary and laboratory) are listed below for reference.      Microbiology: Recent Results (from the past 240 hour(s))  MRSA PCR Screening     Status: None   Collection Time: 11/26/18  7:51 AM  Result Value Ref Range Status   MRSA by PCR NEGATIVE NEGATIVE Final    Comment:        The GeneXpert MRSA Assay (FDA approved for NASAL specimens only), is one component of a comprehensive MRSA colonization surveillance program. It is not intended to diagnose MRSA infection nor to guide or monitor treatment for MRSA infections. Performed at [facility], [address], [place], [place] [address]      Labs: BNP (last 3 results) No results for input(s): BNP in the last 8760 hours. Basic Metabolic Panel: Recent Labs  Lab 11/26/18 0526 11/27/18 0442 11/28/18 0605 11/29/18 0419 11/30/18 0604  NA 137 135 136 136 137  K 3.7 3.5 3.4* 4.3 3.7  CL 104 100 101 103 104  CO2 23 26 24 24 26   GLUCOSE 154* 195* 105* 259* 219*  BUN 30* 33* 25* 16 22*  CREATININE 1.28* 1.56* 1.33* 1.02 1.13  CALCIUM 8.1* 8.2* 8.2* 8.2* 8.1*   Liver Function Tests: Recent Labs  Lab 11/26/18 0526 11/27/18 0442 11/28/18 0605 11/29/18 0419 11/30/18 0604  AST 23 24 23  40 35  ALT 27 24 22 27 29   ALKPHOS 35* 41 47 42 46  BILITOT 1.7* 1.4* 1.1 0.5 0.5  PROT 6.8 6.7 6.8 6.4* 6.3*  ALBUMIN 3.1*  2.9* 2.8* 2.5* 2.4*   Recent Labs  Lab 11/23/18 2058 11/24/18 1323 11/25/18 2200  LIPASE 36 26 22   No results for input(s): AMMONIA in the last 168 hours. CBC: Recent Labs  Lab 11/25/18 2200 11/26/18 0526 11/27/18 0442 11/28/18 0605 11/29/18 0419 11/30/18 0604  WBC 8.5 7.5 6.9 4.8 5.0 4.7  NEUTROABS 6.6  --   --   --  4.3 3.7  HGB 15.4 13.6 12.8* 12.0* 12.0* 11.1*  HCT 47.1 41.1 39.0 36.8* 36.2* 33.5*  MCV 93.3 92.8 93.8 93.6 94.5 93.8  PLT 93* 82* 95* 96* 112* 132*   Cardiac Enzymes: Recent Labs  Lab 11/23/18 2058  TROPONINI <0.03   BNP: Invalid input(s): POCBNP CBG: Recent Labs  Lab 11/29/18 0734 11/29/18 1122 11/29/18 2213 11/30/18 0748  11/30/18 1125  GLUCAP 225* 221* 293* 178* 183*   D-Dimer No results for input(s): DDIMER in the last 72 hours. Hgb A1c No results for input(s): HGBA1C in the last 72 hours. Lipid Profile No results for input(s): CHOL, HDL, LDLCALC, TRIG, CHOLHDL, LDLDIRECT in the last 72 hours. Thyroid function studies No results for input(s): TSH, T4TOTAL, T3FREE, THYROIDAB in the last 72 hours.  Invalid input(s): FREET3 Anemia work up No results for input(s): VITAMINB12, FOLATE, FERRITIN, TIBC, IRON, RETICCTPCT in the last 72 hours. Urinalysis    Component Value Date/Time   COLORURINE AMBER (A) 11/25/2018 2144   APPEARANCEUR HAZY (A) 11/25/2018 2144   LABSPEC 1.028 11/25/2018 2144   PHURINE 5.0 11/25/2018 2144   GLUCOSEU NEGATIVE 11/25/2018 2144   HGBUR SMALL (A) 11/25/2018 2144   BILIRUBINUR NEGATIVE 11/25/2018 2144   [place] 11/25/2018 2144   PROTEINUR 100 (A) 11/25/2018 2144   UROBILINOGEN 0.2 12/04/2013 0730   NITRITE NEGATIVE 11/25/2018 2144   LEUKOCYTESUR NEGATIVE 11/25/2018 2144   Sepsis Labs Invalid input(s): PROCALCITONIN,  WBC,  LACTICIDVEN Microbiology Recent Results (from the past 240 hour(s))  MRSA PCR Screening     Status: None   Collection Time: 11/26/18  7:51 AM  Result Value Ref Range Status   MRSA by PCR NEGATIVE NEGATIVE Final    Comment:        The GeneXpert MRSA Assay (FDA approved for NASAL specimens only), is one component of a comprehensive MRSA colonization surveillance program. It is not intended to diagnose MRSA infection nor to guide or monitor treatment for MRSA infections. Performed at [facility], [address] [place], [place] [address]    Time coordinating discharge: 33 minutes   SIGNED:  Irwin Brakeman, MD  [facility] 11/30/2018, 4:38 PM

## 2018-12-01 NOTE — Progress Notes (Signed)
Patient discharged home with instructions given on medications,and follow up visits,patient and family verbalized understanding. Prescriptions sent to Pharmacy of choice documented on AVS. Accompanied by staff to an awaiting vehicle.

## 2018-12-02 ENCOUNTER — Ambulatory Visit (INDEPENDENT_AMBULATORY_CARE_PROVIDER_SITE_OTHER): Payer: Medicare Other | Admitting: Internal Medicine

## 2018-12-02 ENCOUNTER — Encounter (INDEPENDENT_AMBULATORY_CARE_PROVIDER_SITE_OTHER): Payer: Self-pay | Admitting: Internal Medicine

## 2018-12-02 VITALS — BP 135/79 | HR 79 | Temp 98.4°F | Ht 69.0 in | Wt 230.4 lb

## 2018-12-02 DIAGNOSIS — I200 Unstable angina: Secondary | ICD-10-CM | POA: Diagnosis not present

## 2018-12-02 DIAGNOSIS — K7460 Unspecified cirrhosis of liver: Secondary | ICD-10-CM | POA: Diagnosis not present

## 2018-12-02 NOTE — Patient Instructions (Signed)
Cirrhosis  Cirrhosis is long-term (chronic) liver injury. The liver is the body's largest internal organ, and it performs many functions. It converts food into energy, removes toxic material from the blood, makes important proteins, and absorbs necessary vitamins from food. In cirrhosis, healthy liver cells are replaced by scar tissue. This prevents blood from flowing through the liver, making it difficult for the liver to function. Scarring of the liver cannot be reversed, but treatment can prevent it from getting worse. What are the causes? Common causes of this condition are hepatitis C and long-term alcohol abuse. Other causes include:  Nonalcoholic fatty liver disease. This happens when fat is deposited in the liver by causes other than alcohol.  Hepatitis B infection.  Autoimmune hepatitis. In this condition, the body's defense system (immune system) mistakenly attacks the liver cells, causing irritation and swelling (inflammation).  Diseases that cause blockage of ducts inside the liver.  Inherited liver diseases, such as hemochromatosis. This is one of the most common inherited liver diseases. In this disease, deposits of iron collect in the liver and other organs.  Reactions to certain long-term medicines, such as amiodarone, a heart medicine.  Parasitic infections. These include schistosomiasis, which is caused by a flatworm.  Long-term contact to certain toxins. These toxins include certain organic solvents, such as toluene and chloroform. What increases the risk? You are more likely to develop this condition if:  You have certain types of viral hepatitis.  You abuse alcohol, especially if you are male.  You are overweight.  You share needles.  You have unprotected sex with someone who has viral hepatitis. What are the signs or symptoms? You may not have any signs and symptoms at first. Symptoms may not develop until the damage to your liver starts to get worse. Early  symptoms may include:  Weakness and tiredness (fatigue).  Changes in sleep patterns or having trouble sleeping.  Itchiness.  Tenderness in the right-upper part of your abdomen.  Weight loss and muscle loss.  Nausea.  Loss of appetite.  Appearance of tiny blood vessels under the skin. Later symptoms may include:  Fatigue or weakness that is getting worse.  Yellow skin and eyes (jaundice).  Buildup of fluid in the abdomen (ascites). You may notice that your clothes are tight around your waist.  Weight gain.  Swelling of the feet and ankles (edema).  Trouble breathing.  Easy bruising and bleeding.  Vomiting blood.  Black or bloody stool.  Mental confusion. How is this diagnosed? Your health care provider may suspect cirrhosis based on your symptoms and medical history, especially if you have other medical conditions or a history of alcohol abuse. Your health care provider will do a physical exam to feel your liver and to check for signs of cirrhosis. He or she may perform other tests, including:  Blood tests to check: ? For hepatitis B or C. ? Kidney function. ? Liver function.  Imaging tests such as: ? MRI or CT scan to look for changes seen in advanced cirrhosis. ? Ultrasound to see if normal liver tissue is being replaced by scar tissue.  A procedure in which a long needle is used to take a sample of liver tissue to be checked in a lab (biopsy). Liver biopsy can confirm the diagnosis of cirrhosis. How is this treated? Treatment for this condition depends on how damaged your liver is and what caused the damage. It may include treating the symptoms of cirrhosis, or treating the underlying causes in order to  slow the damage. Treatment may include:  Making lifestyle changes, such as: ? Eating a healthy diet. You may need to work with your health care provider or a diet and nutrition specialist (dietitian) to develop an eating plan. ? Restricting salt  intake. ? Maintaining a healthy weight. ? Not abusing drugs or alcohol.  Taking medicines to: ? Treat liver infections or other infections. ? Control itching. ? Reduce fluid buildup. ? Reduce certain blood toxins. ? Reduce risk of bleeding from enlarged blood vessels in the stomach or esophagus (varices).  Liver transplant. In this procedure, a liver from a donor is used to replace your diseased liver. This is done if cirrhosis has caused liver failure. Other treatments and procedures may be done depending on the problems that you get from cirrhosis. Common problems include liver-related kidney failure (hepatorenal syndrome). Follow these instructions at home:   Take medicines only as told by your health care provider. Do not use medicines that are toxic to your liver. Ask your health care provider before taking any new medicines, including over-the-counter medicines.  Rest as needed.  Eat a well-balanced diet. Ask your health care provider or dietitian for more information.  Limit your salt or water intake, if your health care provider asks you to do this.  Do not drink alcohol. This is especially important if you are taking acetaminophen.  Keep all follow-up visits as told by your health care provider. This is important. Contact a health care provider if you:  Have fatigue or weakness that is getting worse.  Develop swelling of the hands, feet, legs, or face.  Have a fever.  Develop loss of appetite.  Have nausea or vomiting.  Develop jaundice.  Develop easy bruising or bleeding. Get help right away if you:  Vomit bright red blood or a material that looks like coffee grounds.  Have blood in your stools.  Notice that your stools appear black and tarry.  Become confused.  Have chest pain or trouble breathing. Summary  Cirrhosis is chronic liver injury. Liver damage cannot be reversed. Common causes are hepatitis C and long-term alcohol abuse.  Tests used to  diagnose cirrhosis include blood tests, imaging tests, and liver biopsy.  Treatment for this condition involves treating the underlying cause. Avoid alcohol, drugs, salt, and medicines that may damage your liver.  Contact your health care provider if you develop ascites, edema, jaundice, fever, nausea or vomiting, easy bruising or bleeding, or worsening fatigue. This information is not intended to replace advice given to you by your health care provider. Make sure you discuss any questions you have with your health care provider. Document Released: 10/26/2005 Document Revised: 09/15/2017 Document Reviewed: 09/15/2017 Elsevier Interactive Patient Education  2019 [employer].

## 2018-12-02 NOTE — Progress Notes (Signed)
Subjective:    Patient ID: [name] [name], male    DOB: 01/06/1956, 63 y.o.   MRN: [id number] PCP Dr. [name].  HPI Referred to our office for new diagnosis of cirrhosis. Underwent a laparoscopic cholecystectomy on 11/28/2018 and liver biopsy was performed by Dr. Constance Haw. 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  Noted also that his platelets are low at 132.  Liver enzymes are normal.  Patient states he was told maybe 5 yrs ago he had a fatty liver. Also knew he knew he had low platelets.  No family hx of cirrhosis that he know of.  He is not a drinker. (Never drank). No IV drugs or tattoos.  Last HA1C a few months ago was 7.1.    Hepatic Function Latest Ref Rng & Units 11/30/2018 11/29/2018 11/28/2018  Total Protein 6.5 - 8.1 g/dL 6.3(L) 6.4(L) 6.8  Albumin 3.5 - 5.0 g/dL 2.4(L) 2.5(L) 2.8(L)  AST 15 - 41 U/L 35 40 23  ALT 0 - 44 U/L 29 27 22   Alk Phosphatase 38 - 126 U/L 46 42 47  Total Bilirubin 0.3 - 1.2 mg/dL 0.5 0.5 1.1  Bilirubin, Direct 0.0 - 0.2 mg/dL - - -    CBC    Component Value Date/Time   WBC 4.7 11/30/2018 0604   RBC 3.57 (L) 11/30/2018 0604   HGB 11.1 (L) 11/30/2018 0604   HCT 33.5 (L) 11/30/2018 0604   PLT 132 (L) 11/30/2018 0604   MCV 93.8 11/30/2018 0604   MCH 31.1 11/30/2018 0604   MCHC 33.1 11/30/2018 0604   RDW 13.2 11/30/2018 0604   LYMPHSABS 0.6 (L) 11/30/2018 0604   MONOABS 0.4 11/30/2018 0604   EOSABS 0.0 11/30/2018 0604   BASOSABS 0.0 11/30/2018 0604     Review of Systems Past Medical History:  Diagnosis Date  . Anxiety   . Arthritis   . Asthma   . Cataract    Right eye  . CHF (congestive heart failure) ([place])   . Chronic lower back pain   . Coronary atherosclerosis of native coronary artery    a. LAD & OM stenting;  b. 2007 Cath: nonobs dzs, patent stents;  c. 07/2012 neg MV, EF 61%.  d. 11/20/14: [place] s/p  PCI w/ DES to pLAD and DES to OM1   . Essential hypertension, benign   . GERD (gastroesophageal reflux disease)   . Hearing  loss of left ear   . History of gout   . History of hiatal hernia   . Hypercholesteremia   . Lumbar herniated disc   . MI, old   . Migraine   . OSA on CPAP   . 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 Thrombocytopenia ([place])   . TIA (transient ischemic attack) ~ 2010  . Type 2 diabetes mellitus ([place])     Past Surgical History:  Procedure Laterality Date  . ANTERIOR CERVICAL DECOMP/DISCECTOMY FUSION  1998   "C3-4"  . CARDIAC CATHETERIZATION  "several"  . CARDIAC CATHETERIZATION N/A 12/30/2015   Procedure: Left Heart Cath and Coronary Angiography;  Surgeon: [name], MD;  Location: [facility];  Service: Cardiovascular;  Laterality: N/A;  . CARPAL TUNNEL RELEASE Bilateral 2005  . CHOLECYSTECTOMY N/A 11/28/2018   Procedure: LAPAROSCOPIC CHOLECYSTECTOMY;  Surgeon: [name], MD;  Location: [facility];  Service: General;  Laterality: N/A;  . COLONOSCOPY    .  CORONARY ANGIOPLASTY    . CORONARY ANGIOPLASTY WITH STENT PLACEMENT  2002; 2003; 11/20/2014   "I have 4 stents after today" (11/20/2014)  . CORONARY ARTERY BYPASS GRAFT N/A 01/09/2016   Procedure: CORONARY ARTERY BYPASS GRAFTING (CABG) X 3 UTILIZING RIGHT AND LEFT INTERNAL MAMMARY ARTERY AND ENDOSCOPICALLY HARVESTED SAPHENEOUS VEIN.;  Surgeon: Rexene Alberts, MD;  Location: [facility];  Service: Open Heart Surgery;  Laterality: N/A;  . ESOPHAGOGASTRODUODENOSCOPY    . KNEE SURGERY Left 02/2012   "scraped; open"  . LEFT HEART CATH AND CORS/GRAFTS ANGIOGRAPHY N/A 08/09/2017   Procedure: LEFT HEART CATH AND CORS/GRAFTS ANGIOGRAPHY;  Surgeon: [name], MD;  Location: [facility];  Service: Cardiovascular;  Laterality: N/A;  . LEFT HEART CATH AND CORS/GRAFTS ANGIOGRAPHY N/A 11/22/2018   Procedure: LEFT HEART CATH AND CORS/GRAFTS ANGIOGRAPHY;  Surgeon: [name], [name], MD;  Location: [facility];  Service: Cardiovascular;  Laterality: N/A;  . LEFT  HEART CATHETERIZATION WITH CORONARY ANGIOGRAM N/A 07/20/2012   Procedure: LEFT HEART CATHETERIZATION WITH CORONARY ANGIOGRAM;  Surgeon: [name], MD;  Location: [facility];  Service: Cardiovascular;  Laterality: N/A;  . LEFT HEART CATHETERIZATION WITH CORONARY ANGIOGRAM N/A 11/20/2014   Procedure: LEFT HEART CATHETERIZATION WITH CORONARY ANGIOGRAM;  Surgeon: [name], MD;  Location: [facility];  Service: Cardiovascular;  Laterality: N/A;  . LEFT HEART CATHETERIZATION WITH CORONARY ANGIOGRAM N/A 11/26/2014   Procedure: LEFT HEART CATHETERIZATION WITH CORONARY ANGIOGRAM;  Surgeon: [name], MD;  Location[facility];  Service: Cardiovascular;  Laterality: N/A;  . NEUROPLASTY / TRANSPOSITION ULNAR NERVE AT ELBOW Right ~ 2012  . PERCUTANEOUS CORONARY ROTOBLATOR INTERVENTION (PCI-R)  11/20/2014   Procedure: PERCUTANEOUS CORONARY ROTOBLATOR INTERVENTION (PCI-R);  Surgeon: [name], MD;  Location: [facility] CATH LAB;  Service: Cardiovascular;;  . SHOULDER ARTHROSCOPY Left ~ 2011  . TEE WITHOUT CARDIOVERSION N/A 01/09/2016   Procedure: TRANSESOPHAGEAL ECHOCARDIOGRAM (TEE);  Surgeon: Rexene Alberts, MD;  Location: [facility];  Service: Open Heart Surgery;  Laterality: N/A;    Allergies  Allergen Reactions  . Divalproex Sodium Other (See Comments)    Causes anger  . Imdur [Isosorbide Dinitrate] Other (See Comments)    Severe headache (trying lower dose 07/05/18)  . Lipitor [Atorvastatin Calcium]     cramps  . Statins Other (See Comments)    Muscle aches and cramps  . Tramadol Other (See Comments)    Chest pain   . Tricor [Fenofibrate] Other (See Comments)    Leg cramps  . Valproic Acid Other (See Comments)    "anger"    Current Outpatient Medications on File Prior to Visit  Medication Sig Dispense Refill  . albuterol (PROVENTIL HFA;VENTOLIN HFA) 108 (90 BASE) MCG/ACT inhaler Inhale 2 puffs into the lungs every 6 (six) hours as needed for wheezing or shortness of breath.    [name]  amLODipine (NORVASC) 5 MG tablet Take 5 mg by mouth at bedtime.  4  . aspirin EC 81 MG tablet Take 81 mg by mouth at bedtime.     . cetirizine (ZYRTEC) 10 MG tablet Take 10 mg by mouth at bedtime.    . citalopram (CELEXA) 20 MG tablet Take 20 mg by mouth at bedtime.     [name] ezetimibe (ZETIA) 10 MG tablet Take 10 mg by mouth at bedtime.    . fluticasone (FLONASE) 50 MCG/ACT nasal spray Place 1 spray into the nose 2 (two) times daily.     . Fluticasone-Salmeterol (ADVAIR) 250-50  MCG/DOSE AEPB Inhale 1 puff into the lungs every 12 (twelve) hours.    . furosemide (LASIX) 20 MG tablet Take 1 tablet (20 mg total) by mouth daily. 30 tablet 1  . gabapentin (NEURONTIN) 300 MG capsule Take 300 mg by mouth 3 (three) times daily.  2  . insulin aspart (NOVOLOG FLEXPEN) 100 UNIT/ML FlexPen Inject 2-30 Units into the skin 3 (three) times daily as needed for high blood sugar (CBG >150). Per sliding scale written down at home     . isosorbide mononitrate (IMDUR) 30 MG 24 hr tablet Take 30 mg by mouth at bedtime.  4  . Melatonin 5 MG TABS Take 10 mg by mouth at bedtime.     . metFORMIN (GLUCOPHAGE) 1000 MG tablet Take 1 tablet (1,000 mg total) by mouth 2 (two) times daily. For the next 2 days as you did receive IV contrast, then resume on 11/26/2018 60 tablet 11  . metoprolol tartrate (LOPRESSOR) 50 MG tablet Take 50 mg by mouth at bedtime.    [name] omeprazole (PRILOSEC) 20 MG capsule Take 20 mg by mouth 2 (two) times daily before a meal.    . oxyCODONE (ROXICODONE) 5 MG immediate release tablet Take 1 tablet (5 mg total) by mouth every 4 (four) hours as needed for severe pain. 12 tablet 0  . pravastatin (PRAVACHOL) 20 MG tablet Take 1 tablet (20 mg total) by mouth daily. 10 tablet 0  . topiramate (TOPAMAX) 25 MG capsule Take 25 mg by mouth 2 (two) times daily.    . TRESIBA FLEXTOUCH 100 UNIT/ML SOPN FlexTouch Pen Inject 60-70 Units into the skin See admin instructions. 60 units in the morning and 70 units at bedtime  5    . docusate sodium (COLACE) 100 MG capsule Take 1 capsule (100 mg total) by mouth 2 (two) times daily. (Patient not taking: Reported on 12/02/2018) 10 capsule 0  . nitroGLYCERIN (NITROSTAT) 0.4 MG SL tablet Place 1 tablet (0.4 mg total) under the tongue every 5 (five) minutes as needed for chest pain. 25 tablet 3   No current facility-administered medications on file prior to visit.         Objective:   Physical Exam Blood pressure 135/79, pulse 79, temperature 98.4 F (36.9 C), height 5' 9"  (1.753 m), weight 230 lb 6.4 oz (104.5 kg). Alert and oriented. Skin warm and dry. Oral mucosa is moist.   . Sclera anicteric, conjunctivae is pink. Thyroid not enlarged. No cervical lymphadenopathy. Lungs clear. Heart regular rate and rhythm.  Abdomen is soft. Bowel sounds are positive. No hepatomegaly. No abdominal masses felt. No tenderness.  No edema to lower extremities.         Assessment & Plan:  Cirrhosis of ? Etiology. Will rule out auto immune process.  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  Needs to diet and exercise.  Ceruloplasmin, alpha 1 antitrypsin, SMA, ANA, IGG, AFP, PT/INR, sedrate. Acute hepatitis panel Needs to diet and exercise. OV in 6 months

## 2018-12-05 LAB — PROTIME-INR
INR: 1.1
Prothrombin Time: 11.7 s — ABNORMAL HIGH (ref 9.0–11.5)

## 2018-12-05 LAB — IRON, TOTAL/TOTAL IRON BINDING CAP
%SAT: 18 % — AB (ref 20–48)
IRON: 47 ug/dL — AB (ref 50–180)
TIBC: 258 mcg/dL (calc) (ref 250–425)

## 2018-12-05 LAB — FERRITIN: Ferritin: 228 ng/mL (ref 24–380)

## 2018-12-05 LAB — SEDIMENTATION RATE: Sed Rate: 89 mm/h — ABNORMAL HIGH (ref 0–20)

## 2018-12-05 LAB — CERULOPLASMIN: CERULOPLASMIN: 35 mg/dL (ref 18–36)

## 2018-12-05 LAB — HEPATITIS PANEL, ACUTE
Hep A IgM: NONREACTIVE
Hep B C IgM: NONREACTIVE
Hepatitis B Surface Ag: NONREACTIVE
Hepatitis C Ab: NONREACTIVE
SIGNAL TO CUT-OFF: 0.01 (ref <1.00)

## 2018-12-05 LAB — ANA: Anti Nuclear Antibody(ANA): NEGATIVE

## 2018-12-05 LAB — AFP TUMOR MARKER: AFP-Tumor Marker: 1.5 ng/mL (ref <6.1)

## 2018-12-05 LAB — ALPHA-1-ANTITRYPSIN: A-1 Antitrypsin, Ser: 267 mg/dL — ABNORMAL HIGH (ref 83–199)

## 2018-12-05 LAB — IGG: IgG (Immunoglobin G), Serum: 1357 mg/dL (ref 600–1640)

## 2018-12-06 ENCOUNTER — Ambulatory Visit (INDEPENDENT_AMBULATORY_CARE_PROVIDER_SITE_OTHER): Payer: Self-pay | Admitting: General Surgery

## 2018-12-06 ENCOUNTER — Encounter: Payer: Self-pay | Admitting: General Surgery

## 2018-12-06 ENCOUNTER — Ambulatory Visit: Payer: Medicare Other | Admitting: General Surgery

## 2018-12-06 VITALS — BP 121/59 | HR 75 | Temp 96.2°F | Resp 18 | Wt 223.0 lb

## 2018-12-06 DIAGNOSIS — K8000 Calculus of gallbladder with acute cholecystitis without obstruction: Secondary | ICD-10-CM

## 2018-12-06 MED ORDER — OXYCODONE HCL 5 MG PO TABS: 5.0000 mg | ORAL_TABLET | ORAL | 0 refills | Status: DC | PRN

## 2018-12-06 NOTE — Patient Instructions (Addendum)
Activity and diet as tolerated.  No taking narcotic pain meds and driving or going to work.

## 2018-12-06 NOTE — Progress Notes (Signed)
[facility] Note   HPI:  63 y.o. Male presents to clinic for post-op follow-up after a laparoscopic cholecystectomy and liver biopsy. Patient reports he is doing fair. He is still needing some pain medication at night. He is eating and having Bms. The drain is putting out serous material and it is < 50cc a day.  Review of Systems:  No fever or chills Eating diet  All other review of systems: otherwise negative   Pathology: Diagnosis 1. Gallbladder - GANGRENOUS CHOLECYSTITIS WITH ADHESIONS 2. Liver, biopsy - CIRRHOSIS (STAGE 4 OF 4) OF UNCERTAIN ETIOLOGY. SEE NOTE - MILD STEATOSIS  Vital Signs:  BP (!) 121/59 (BP Location: Left Arm, Patient Position: Sitting, Cuff Size: Large)   Pulse 75   Temp (!) 96.2 F (35.7 C) (Temporal)   Resp 18   Wt 223 lb (101.2 kg)   BMI 32.93 kg/m    Physical Exam:  Physical Exam Vitals signs reviewed.  HENT:     Head: Normocephalic.  Neck:     Musculoskeletal: Normal range of motion.  Cardiovascular:     Rate and Rhythm: Normal rate.  Pulmonary:     Effort: Pulmonary effort is normal.  Abdominal:     General: There is no distension.     Palpations: Abdomen is soft.     Tenderness: There is no abdominal tenderness.     Comments: Port sites healing with dermabond, JP drain with serous output removed and gauze placed  Neurological:     Mental Status: He is alert.      Assessment:  63 y.o. yo Male s/p laparoscopic cholecystectomy for acute cholecystitis. Also found to have cirrhosis on his liver biopsy. Seen by Gi and they are working up the cause of the cirrhosis.    Plan:  -Follow up with [place] PRN -Diet and activity as tolerated -Refilled Roxicodone 74m q4 PRN 12 tablets  -Return to work on Monday   Future Appointments  Date Time Provider [place] 12/06/2018  9:45 AM BVirl Cagey MD RS-RS None  12/12/2018  2:30 PM KErlene Quan PA-C [facility]  12/26/2018  1:00 PM KDerek Jack MD AP-ACAPA None  06/05/2019  2:45 PM SVaughan Basta TRona Ravens NP NRE-NRE None    LCurlene Labrum MD [facility]SIgnacia MarvelRLas Cru[place] [phone](office)

## 2018-12-07 ENCOUNTER — Encounter (INDEPENDENT_AMBULATORY_CARE_PROVIDER_SITE_OTHER): Payer: Self-pay | Admitting: *Deleted

## 2018-12-07 ENCOUNTER — Other Ambulatory Visit (INDEPENDENT_AMBULATORY_CARE_PROVIDER_SITE_OTHER): Payer: Self-pay | Admitting: *Deleted

## 2018-12-07 ENCOUNTER — Telehealth (INDEPENDENT_AMBULATORY_CARE_PROVIDER_SITE_OTHER): Payer: Self-pay | Admitting: Internal Medicine

## 2018-12-07 DIAGNOSIS — K7581 Nonalcoholic steatohepatitis (NASH): Secondary | ICD-10-CM

## 2018-12-07 NOTE — Telephone Encounter (Signed)
Mitzi, Needs OV in 3 months instead of 6.

## 2018-12-07 NOTE — Telephone Encounter (Signed)
Tammy, Sedrate in 2 weeks. Please send letter

## 2018-12-07 NOTE — Telephone Encounter (Signed)
Sed rate noted and a letter has been sent as a reminder.

## 2018-12-12 ENCOUNTER — Ambulatory Visit: Payer: Medicare Other | Admitting: Cardiology

## 2018-12-12 DIAGNOSIS — R0989 Other specified symptoms and signs involving the circulatory and respiratory systems: Secondary | ICD-10-CM

## 2018-12-13 LAB — GLUCOSE, CAPILLARY: Glucose-Capillary: 237 mg/dL — ABNORMAL HIGH (ref 70–99)

## 2018-12-26 ENCOUNTER — Inpatient Hospital Stay (HOSPITAL_COMMUNITY): Payer: Medicare Other | Attending: Hematology | Admitting: Hematology

## 2018-12-26 ENCOUNTER — Encounter (HOSPITAL_COMMUNITY): Payer: Self-pay | Admitting: Hematology

## 2018-12-26 ENCOUNTER — Other Ambulatory Visit: Payer: Self-pay

## 2018-12-26 ENCOUNTER — Inpatient Hospital Stay (HOSPITAL_COMMUNITY): Payer: Medicare Other

## 2018-12-26 VITALS — BP 126/50 | HR 72 | Temp 98.4°F | Resp 16 | Ht 69.0 in | Wt 220.0 lb

## 2018-12-26 DIAGNOSIS — D696 Thrombocytopenia, unspecified: Secondary | ICD-10-CM

## 2018-12-26 DIAGNOSIS — K7460 Unspecified cirrhosis of liver: Secondary | ICD-10-CM | POA: Insufficient documentation

## 2018-12-26 DIAGNOSIS — D61818 Other pancytopenia: Secondary | ICD-10-CM

## 2018-12-26 LAB — FOLATE: Folate: 8.9 ng/mL (ref >5.9)

## 2018-12-26 LAB — VITAMIN B12: Vitamin B-12: 314 pg/mL (ref 180–914)

## 2018-12-26 NOTE — Patient Instructions (Addendum)
[place] at [facility] Discharge Instructions   Start taking 1 iron tablet daily. You can get this over the counter.  Follow up in 4 weeks   Thank you for choosing [place] at [facility] to provide your oncology and hematology care.  To afford each patient quality time with our provider, please arrive at least 15 minutes before your scheduled appointment time.   If you have a lab appointment with the [facility] please come in thru the  Main Entrance and check in at the main information desk  You need to re-schedule your appointment should you arrive 10 or more minutes late.  We strive to give you quality time with our providers, and arriving late affects you and other patients whose appointments are after yours.  Also, if you no show three or more times for appointments you may be dismissed from the clinic at the providers discretion.     Again, thank you for choosing [facility].  Our hope is that these requests will decrease the amount of time that you wait before being seen by our physicians.       _____________________________________________________________  Should you have questions after your visit to [facility], please contact our office at [phone] between the hours of 8:00 a.m. and 4:30 p.m.  Voicemails left after 4:00 p.m. will not be returned until the following business day.  For prescription refill requests, have your pharmacy contact our office and allow 72 hours.    Cancer Center Support Programs:   > Cancer Support Group  2nd Tuesday of the month 1pm-2pm, Journey Room

## 2018-12-26 NOTE — Progress Notes (Signed)
CONSULT NOTE  Patient Care Team: Terrial Rhodes, MD as PCP - General Domenic Polite Aloha Gell, MD as PCP - Cardiology (Cardiology)  CHIEF COMPLAINTS/PURPOSE OF CONSULTATION: Pancytopenia   HISTORY OF PRESENTING ILLNESS:  [name] [name] 63 y.o. male is here because of pancytopenia. He presents with low platelet count, and a low hemoglobin. He reports he has recently had his gallbladder removed and he has been in and out of the hospital. Since he has been out he has been very fatigue, decreased appetite, and 20 pound weight loss. He was also told he had a slightly enlarged spleen and non alcoholic cirrhosis of the liver. He is starting to feel better and re-gain energy since his hospitalization. He denies any new medications or antibiotics. Denies any fevers, night sweats, or chills. Denies any nausea, vomiting, or diarrhea. Denies any new pains. Had not noticed any recent bleeding such as epistaxis, hematuria or hematochezia. Denies recent chest pain on exertion, shortness of breath on minimal exertion, pre-syncopal episodes, or palpitations. Denies any numbness or tingling in hands or feet. Denies any recent fevers, infections, or recent hospitalizations. Patient reports appetite at 25% and energy level at 25%.The patient denies over the counter NSAID ingestion. He is not on antiplatelets agents. He denies ever taking any oral iron supplements. Denies ever smoking or alcohol use.  His last colonoscopy was over 5 years ago and it was normal. He had no prior history or diagnosis of cancer.  He has a family history with a paternal grandfather with throat cancer. His brother has MS. His mother had low platelets.  He is full functioning. Lives at home with his wife. He is able to perform all his own ALDs and activities.  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 No known exposure to any harmful chemicals or asbestoses.   MEDICAL HISTORY:  Past Medical History:  Diagnosis Date   . Anxiety   . Arthritis   . Asthma   . Cataract    Right eye  . Chronic lower back pain   . Coronary atherosclerosis of native coronary artery    a. LAD & OM stenting;  b. 2007 Cath: nonobs dzs, patent stents;  c. 07/2012 neg MV, EF 61%.  d. 11/20/14: [place] s/p  PCI w/ DES to pLAD and DES to OM1   . Essential hypertension, benign   . GERD (gastroesophageal reflux disease)   . Hearing loss of left ear   . History of gout   . History of hiatal hernia   . Hypercholesteremia   . Lumbar herniated disc   . MI, old   . Migraine   . OSA on CPAP   . 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 Thrombocytopenia ([place])   . TIA (transient ischemic attack) ~ 2010  . Type 2 diabetes mellitus ([place])     SURGICAL HISTORY: Past Surgical History:  Procedure Laterality Date  . ANTERIOR CERVICAL DECOMP/DISCECTOMY FUSION  1998   "C3-4"  . CARDIAC CATHETERIZATION  "several"  . CARDIAC CATHETERIZATION N/A 12/30/2015   Procedure: Left Heart Cath and Coronary Angiography;  Surgeon: [name], MD;  Location: [facility];  Service: Cardiovascular;  Laterality: N/A;  . CARPAL TUNNEL RELEASE Bilateral 2005  . CHOLECYSTECTOMY N/A 11/28/2018   Procedure: LAPAROSCOPIC CHOLECYSTECTOMY;  Surgeon: [name], MD;  Location: [facility];  Service: General;  Laterality: N/A;  . COLONOSCOPY    . CORONARY ANGIOPLASTY    . CORONARY ANGIOPLASTY WITH STENT PLACEMENT  2002; 2003; 11/20/2014   "I have 4 stents after today" (11/20/2014)  . CORONARY ARTERY BYPASS GRAFT N/A 01/09/2016   Procedure: CORONARY ARTERY BYPASS GRAFTING (CABG) X 3 UTILIZING RIGHT AND LEFT INTERNAL MAMMARY ARTERY AND ENDOSCOPICALLY HARVESTED SAPHENEOUS VEIN.;  Surgeon: Rexene Alberts, MD;  Location: [facility];  Service: Open Heart Surgery;  Laterality: N/A;  . ESOPHAGOGASTRODUODENOSCOPY    . KNEE SURGERY Left 02/2012   "scraped; open"  . LEFT HEART CATH AND CORS/GRAFTS ANGIOGRAPHY  N/A 08/09/2017   Procedure: LEFT HEART CATH AND CORS/GRAFTS ANGIOGRAPHY;  Surgeon: [name], MD;  Location: [facility];  Service: Cardiovascular;  Laterality: N/A;  . LEFT HEART CATH AND CORS/GRAFTS ANGIOGRAPHY N/A 11/22/2018   Procedure: LEFT HEART CATH AND CORS/GRAFTS ANGIOGRAPHY;  Surgeon: [name], [name], MD;  Location: [facility];  Service: Cardiovascular;  Laterality: N/A;  . LEFT HEART CATHETERIZATION WITH CORONARY ANGIOGRAM N/A 07/20/2012   Procedure: LEFT HEART CATHETERIZATION WITH CORONARY ANGIOGRAM;  Surgeon: [name], MD;  Location: [facility];  Service: Cardiovascular;  Laterality: N/A;  . LEFT HEART CATHETERIZATION WITH CORONARY ANGIOGRAM N/A 11/20/2014   Procedure: LEFT HEART CATHETERIZATION WITH CORONARY ANGIOGRAM;  Surgeon: [name], MD;  Location: [facility] C[facility] LAB;  Service: Cardiovascular;  Laterality: N/A;  . LEFT HEART CATHETERIZATION WITH CORONARY ANGIOGRAM N/A 11/26/2014   Procedure: LEFT HEART CATHETERIZATION WITH CORONARY ANGIOGRAM;  Surgeon: [name], MD;  Location: [facility] CATH LAB;  Service: Cardiovascular;  Laterality: N/A;  . NEUROPLASTY / TRANSPOSITION ULNAR NERVE AT ELBOW Right ~ 2012  . PERCUTANEOUS CORONARY ROTOBLATOR INTERVENTION (PCI-R)  11/20/2014   Procedure: PERCUTANEOUS CORONARY ROTOBLATOR INTERVENTION (PCI-R);  Surgeon: [name], MD;  Location: [facility] CATH LAB;  Service: Cardiovascular;;  . SHOULDER ARTHROSCOPY Left ~ 2011  . TEE WITHOUT CARDIOVERSION N/A 01/09/2016   Procedure: TRANSESOPHAGEAL ECHOCARDIOGRAM (TEE);  Surgeon: Rexene Alberts, MD;  Location: [facility];  Service: Open Heart Surgery;  Laterality: N/A;    SOCIAL HISTORY: Social History   Socioeconomic History  . Marital status: Married    Spouse name: [name]  . Number of children: 3  . Years of education: 53 th   . Highest education level: Not on file  Occupational History  . Occupation: Unemployed  Social Needs  . Financial resource strain: Not on file   . Food insecurity:    Worry: Not on file    Inability: Not on file  . Transportation needs:    Medical: Not on file    Non-medical: Not on file  Tobacco Use  . Smoking status: Never Smoker  . Smokeless tobacco: Never Used  Substance and Sexual Activity  . Alcohol use: No  . Drug use: No  . Sexual activity: Yes  Lifestyle  . Physical activity:    Days per week: Not on file    Minutes per session: Not on file  . Stress: Not on file  Relationships  . Social connections:    Talks on phone: Not on file    Gets together: Not on file    Attends religious service: Not on file    Active member of club or organization: Not on file    Attends meetings of clubs or organizations: Not on file    Relationship status: Not on file  . Intimate partner violence:    Fear of  current or ex partner: Not on file    Emotionally abused: Not on file    Physically abused: Not on file    Forced sexual activity: Not on file  Other Topics Concern  . Not on file  Social History Narrative   Patient lives at home with wife [name].   Patient works at [employer], Engineer, petroleum.   Patient has a 12 th grade education.    Patient has 3 children.        FAMILY HISTORY: Family History  Problem Relation Age of Onset  . Stroke Mother   . Coronary artery disease Father 6  . Asthma Sister   . Multiple sclerosis Brother   . Heart disease Paternal Uncle   . Stroke Maternal Grandmother   . Heart attack Paternal Grandmother   . Cancer Paternal Grandfather     ALLERGIES:  is allergic to divalproex sodium; imdur [isosorbide dinitrate]; lipitor [atorvastatin calcium]; statins; tramadol; tricor [fenofibrate]; and valproic acid.  MEDICATIONS:  Current Outpatient Medications  Medication Sig Dispense Refill  . albuterol (PROVENTIL HFA;VENTOLIN HFA) 108 (90 BASE) MCG/ACT inhaler Inhale 2 puffs into the lungs every 6 (six) hours as needed for wheezing or shortness of breath.    [name] amLODipine  (NORVASC) 5 MG tablet Take 5 mg by mouth at bedtime.  4  . aspirin EC 81 MG tablet Take 81 mg by mouth at bedtime.     . cetirizine (ZYRTEC) 10 MG tablet Take 10 mg by mouth at bedtime.    . citalopram (CELEXA) 20 MG tablet Take 20 mg by mouth at bedtime.     [name] ezetimibe (ZETIA) 10 MG tablet Take 10 mg by mouth at bedtime.    . fluticasone (FLONASE) 50 MCG/ACT nasal spray Place 1 spray into the nose 2 (two) times daily.     [name] gabapentin (NEURONTIN) 300 MG capsule Take 300 mg by mouth 3 (three) times daily.  2  . insulin aspart (NOVOLOG FLEXPEN) 100 UNIT/ML FlexPen Inject 2-30 Units into the skin 3 (three) times daily as needed for high blood sugar (CBG >150). Per sliding scale written down at home     . isosorbide mononitrate (IMDUR) 30 MG 24 hr tablet Take 30 mg by mouth at bedtime.  4  . Melatonin 5 MG TABS Take 10 mg by mouth at bedtime.     . metFORMIN (GLUCOPHAGE) 1000 MG tablet Take 1 tablet (1,000 mg total) by mouth 2 (two) times daily. For the next 2 days as you did receive IV contrast, then resume on 11/26/2018 60 tablet 11  . omeprazole (PRILOSEC) 20 MG capsule Take 20 mg by mouth 2 (two) times daily before a meal.    . pravastatin (PRAVACHOL) 20 MG tablet Take 1 tablet (20 mg total) by mouth daily. 10 tablet 0  . topiramate (TOPAMAX) 25 MG capsule Take 25 mg by mouth 2 (two) times daily.    . TRESIBA FLEXTOUCH 100 UNIT/ML SOPN FlexTouch Pen Inject 60-70 Units into the skin See admin instructions. 60 units in the morning and 70 units at bedtime  5  . Fluticasone-Salmeterol (ADVAIR) 250-50 MCG/DOSE AEPB Inhale 1 puff into the lungs every 12 (twelve) hours.    . furosemide (LASIX) 20 MG tablet Take 1 tablet (20 mg total) by mouth daily. (Patient not taking: Reported on 12/26/2018) 30 tablet 1  . nitroGLYCERIN (NITROSTAT) 0.4 MG SL tablet Place 1 tablet (0.4 mg total) under the tongue every 5 (five) minutes as needed for chest pain. (  Patient not taking: Reported on 12/26/2018) 25 tablet 3   No  current facility-administered medications for this visit.     REVIEW OF SYSTEMS:   Constitutional: Denies fevers, chills or abnormal night sweats Eyes: Denies blurriness of vision, double vision or watery eyes Ears, nose, mouth, throat, and face: Denies mucositis or sore throat Respiratory: Denies cough, dyspnea or wheezes Cardiovascular: Denies palpitation, chest discomfort or lower extremity swelling Gastrointestinal:  Denies nausea, heartburn or change in bowel habits Skin: Denies abnormal skin rashes Lymphatics: Denies new lymphadenopathy or easy bruising Neurological:Denies numbness, tingling or new weaknesses Behavioral/Psych: Mood is stable, no new changes  All other systems were reviewed with the patient and are negative.  PHYSICAL EXAMINATION: ECOG PERFORMANCE STATUS: 1 - Symptomatic but completely ambulatory  Vitals:   12/26/18 1300  BP: (!) 126/50  Pulse: 72  Resp: 16  Temp: 98.4 F (36.9 C)  SpO2: 100%   Filed Weights   12/26/18 1300  Weight: 220 lb (99.8 kg)    GENERAL:alert, no distress and comfortable SKIN: skin color, texture, turgor are normal, no rashes or significant lesions EYES: normal, conjunctiva are pink and non-injected, sclera clear OROPHARYNX:no exudate, no erythema and lips, buccal mucosa, and tongue normal  NECK: supple, thyroid normal size, non-tender, without nodularity LYMPH:  no palpable lymphadenopathy in the cervical, axillary or inguinal LUNGS: clear to auscultation and percussion with normal breathing effort HEART: regular rate & rhythm and no murmurs and no lower extremity edema ABDOMEN:abdomen soft, non-tender and normal bowel sounds Musculoskeletal:no cyanosis of digits and no clubbing  PSYCH: alert & oriented x 3 with fluent speech NEURO: no focal motor/sensory deficits  LABORATORY DATA:  I have reviewed the data as listed Recent Results (from the past 2160 hour(s))  Basic metabolic panel     Status: Abnormal   Collection  Time: 11/20/18  3:20 PM  Result Value Ref Range   Sodium 135 135 - 145 mmol/L   Potassium 4.2 3.5 - 5.1 mmol/L   Chloride 106 98 - 111 mmol/L   CO2 20 (L) 22 - 32 mmol/L   Glucose, Bld 359 (H) 70 - 99 mg/dL   BUN 19 6 - 20 mg/dL   Creatinine, Ser 1.28 (H) 0.61 - 1.24 mg/dL   Calcium 8.6 (L) 8.9 - 10.3 mg/dL   GFR calc non Af Amer >60 >60 mL/min   GFR calc Af Amer >60 >60 mL/min   Anion gap 9 5 - 15    Comment: Performed at [facility], [address], [place], [place] [address]  Troponin I - Once     Status: None   Collection Time: 11/20/18  3:20 PM  Result Value Ref Range   Troponin I <0.03 <0.03 ng/mL    Comment: Performed at [facility], [address], [place], [place] [address]  CBC with Differential     Status: Abnormal   Collection Time: 11/20/18  3:20 PM  Result Value Ref Range   WBC 2.6 (L) 4.0 - 10.5 K/uL   RBC 4.62 4.22 - 5.81 MIL/uL   Hemoglobin 14.2 13.0 - 17.0 g/dL   HCT 41.3 39.0 - 52.0 %   MCV 89.4 80.0 - 100.0 fL   MCH 30.7 26.0 - 34.0 pg   MCHC 34.4 30.0 - 36.0 g/dL   RDW 12.9 11.5 - 15.5 %   Platelets 88 (L) 150 - 400 K/uL    Comment: REPEATED TO VERIFY PLATELET COUNT CONFIRMED BY SMEAR SPECIMEN CHECKED FOR CLOTS Immature Platelet Fraction may be  clinically indicated, consider ordering this additional test [id number]    nRBC 0.0 0.0 - 0.2 %   Neutrophils Relative % 51 %   Neutro Abs 1.3 (L) 1.7 - 7.7 K/uL   Lymphocytes Relative 38 %   Lymphs Abs 1.0 0.7 - 4.0 K/uL   Monocytes Relative 8 %   Monocytes Absolute 0.2 0.1 - 1.0 K/uL   Eosinophils Relative 3 %   Eosinophils Absolute 0.1 0.0 - 0.5 K/uL   Basophils Relative 0 %   Basophils Absolute 0.0 0.0 - 0.1 K/uL   Immature Granulocytes 0 %   Abs Immature Granulocytes 0.00 0.00 - 0.07 K/uL    Comment: Performed at [facility], [address] [place], [place] [address]  Protime-INR     Status: None   Collection Time: 11/20/18  3:20 PM  Result Value Ref Range   Prothrombin Time 13.8 11.4 - 15.2  seconds   INR 1.06     Comment: Performed at [facility], [address] [place], [place] [address]  APTT     Status: None   Collection Time: 11/20/18  3:20 PM  Result Value Ref Range   aPTT 35 24 - 36 seconds    Comment: Performed at [facility], [address] [place], [place] [address]  Glucose, capillary     Status: Abnormal   Collection Time: 11/20/18  9:32 PM  Result Value Ref Range   Glucose-Capillary 187 (H) 70 - 99 mg/dL  Heparin level (unfractionated)     Status: None   Collection Time: 11/20/18 10:40 PM  Result Value Ref Range   Heparin Unfractionated 0.48 0.30 - 0.70 IU/mL    Comment: (NOTE) If heparin results are below expected values, and patient dosage has  been confirmed, suggest follow up testing of antithrombin III levels. Performed at [facility], [address] [place], [place] [address]   Troponin I - Now Then Q6H     Status: None   Collection Time: 11/20/18 10:40 PM  Result Value Ref Range   Troponin I <0.03 <0.03 ng/mL    Comment: Performed at [facility], [address], [place], [place] [address]  CBC     Status: Abnormal   Collection Time: 11/21/18  2:50 AM  Result Value Ref Range   WBC 2.5 (L) 4.0 - 10.5 K/uL   RBC 4.29 4.22 - 5.81 MIL/uL   Hemoglobin 13.1 13.0 - 17.0 g/dL   HCT 39.0 39.0 - 52.0 %   MCV 90.9 80.0 - 100.0 fL   MCH 30.5 26.0 - 34.0 pg   MCHC 33.6 30.0 - 36.0 g/dL   RDW 13.2 11.5 - 15.5 %   Platelets 73 (L) 150 - 400 K/uL    Comment: SPECIMEN CHECKED FOR CLOTS   nRBC 0.0 0.0 - 0.2 %    Comment: Performed at [facility], [address], [place], [place] [address]  HIV antibody (Routine Testing)     Status: None   Collection Time: 11/21/18  2:50 AM  Result Value Ref Range   HIV Screen 4th Generation wRfx Non Reactive Non Reactive    Comment: (NOTE) Performed At: [facility] [place], [place] [id number] Rush Farmer MD IB:[id number]   Troponin I - Now Then Q6H     Status: None   Collection Time:  11/21/18  2:50 AM  Result Value Ref Range   Troponin I <0.03 <0.03 ng/mL    Comment: Performed at [facility], [address] [place], [place] [address]  Basic metabolic panel     Status: Abnormal   Collection Time: 11/21/18  2:50 AM  Result Value Ref Range   Sodium 137 135 - 145 mmol/L   Potassium 3.9 3.5 - 5.1 mmol/L   Chloride 108 98 - 111 mmol/L   CO2 21 (L) 22 - 32 mmol/L   Glucose, Bld 246 (H) 70 - 99 mg/dL   BUN 19 6 - 20 mg/dL   Creatinine, Ser 0.99 0.61 - 1.24 mg/dL   Calcium 8.2 (L) 8.9 - 10.3 mg/dL   GFR calc non Af Amer >60 >60 mL/min   GFR calc Af Amer >60 >60 mL/min   Anion gap 8 5 - 15    Comment: Performed at [facility], [address], [place], [place] [address]  Hepatic function panel     Status: Abnormal   Collection Time: 11/21/18  2:50 AM  Result Value Ref Range   Total Protein 6.2 (L) 6.5 - 8.1 g/dL   Albumin 3.2 (L) 3.5 - 5.0 g/dL   AST 32 15 - 41 U/L   ALT 33 0 - 44 U/L   Alkaline Phosphatase 38 38 - 126 U/L   Total Bilirubin 0.5 0.3 - 1.2 mg/dL   Bilirubin, Direct 0.1 0.0 - 0.2 mg/dL   Indirect Bilirubin 0.4 0.3 - 0.9 mg/dL    Comment: Performed at [facility], [address], [place], [place] [address]  Hemoglobin A1c     Status: Abnormal   Collection Time: 11/21/18  2:51 AM  Result Value Ref Range   Hgb A1c MFr Bld 7.4 (H) 4.8 - 5.6 %    Comment: (NOTE) Pre diabetes:          5.7%-6.4% Diabetes:              >6.4% Glycemic control for   <7.0% adults with diabetes    Mean Plasma Glucose 165.68 mg/dL    Comment: Performed at [facility], [place] [address] [place], [place] [address]  Heparin level (unfractionated)     Status: Abnormal   Collection Time: 11/21/18  3:00 AM  Result Value Ref Range   Heparin Unfractionated 0.15 (L) 0.30 - 0.70 IU/mL    Comment: (NOTE) If heparin results are below expected values, and patient dosage has  been confirmed, suggest follow up testing of antithrombin III levels. Performed at [facility],  [address], [place], [place] [address]   Glucose, capillary     Status: Abnormal   Collection Time: 11/21/18  7:53 AM  Result Value Ref Range   Glucose-Capillary 157 (H) 70 - 99 mg/dL  Heparin level (unfractionated)     Status: Abnormal   Collection Time: 11/21/18 10:22 AM  Result Value Ref Range   Heparin Unfractionated 0.94 (H) 0.30 - 0.70 IU/mL    Comment: (NOTE) If heparin results are below expected values, and patient dosage has  been confirmed, suggest follow up testing of antithrombin III levels. Performed at [facility], [address], [place],  [address]   Glucose, capillary     Status: Abnormal   Collection Time: 11/21/18 11:06 AM  Result Value Ref Range   Glucose-Capillary 136 (H) 70 - 99 mg/dL  ECHOCARDIOGRAM COMPLETE     Status: None   Collection Time: 11/21/18  1:22 PM  Result Value Ref Range   Weight 3,840 oz   Height 69 in   BP 115/62 mmHg  Glucose, capillary     Status: Abnormal   Collection Time: 11/21/18  4:19  PM  Result Value Ref Range   Glucose-Capillary 106 (H) 70 - 99 mg/dL  Heparin level (unfractionated)     Status: Abnormal   Collection Time: 11/21/18  6:04 PM  Result Value Ref Range   Heparin Unfractionated 0.75 (H) 0.30 - 0.70 IU/mL    Comment: (NOTE) If heparin results are below expected values, and patient dosage has  been confirmed, suggest follow up testing of antithrombin III levels. Performed at [facility], [address] [place], [place] [address]   Glucose, capillary     Status: Abnormal   Collection Time: 11/21/18  7:36 PM  Result Value Ref Range   Glucose-Capillary 175 (H) 70 - 99 mg/dL  Glucose, capillary     Status: Abnormal   Collection Time: 11/21/18  9:14 PM  Result Value Ref Range   Glucose-Capillary 161 (H) 70 - 99 mg/dL   Comment 1 Notify RN    Comment 2 Document in Chart   CBC with Differential/Platelet     Status: Abnormal   Collection Time: 11/22/18  6:41 AM  Result Value Ref Range   WBC 2.8 (L) 4.0 - 10.5 K/uL    RBC 4.32 4.22 - 5.81 MIL/uL   Hemoglobin 13.1 13.0 - 17.0 g/dL   HCT 38.6 (L) 39.0 - 52.0 %   MCV 89.4 80.0 - 100.0 fL   MCH 30.3 26.0 - 34.0 pg   MCHC 33.9 30.0 - 36.0 g/dL   RDW 12.8 11.5 - 15.5 %   Platelets 77 (L) 150 - 400 K/uL    Comment: REPEATED TO VERIFY PLATELET COUNT CONFIRMED BY SMEAR Immature Platelet Fraction may be clinically indicated, consider ordering this additional test [id number]    nRBC 0.0 0.0 - 0.2 %   Neutrophils Relative % 53 %   Neutro Abs 1.5 (L) 1.7 - 7.7 K/uL   Lymphocytes Relative 35 %   Lymphs Abs 1.0 0.7 - 4.0 K/uL   Monocytes Relative 9 %   Monocytes Absolute 0.3 0.1 - 1.0 K/uL   Eosinophils Relative 2 %   Eosinophils Absolute 0.1 0.0 - 0.5 K/uL   Basophils Relative 1 %   Basophils Absolute 0.0 0.0 - 0.1 K/uL   Immature Granulocytes 0 %   Abs Immature Granulocytes 0.00 0.00 - 0.07 K/uL    Comment: Performed at [facility], [place] [address] [place], [place] [address]  Save Smear     Status: None   Collection Time: 11/22/18  6:41 AM  Result Value Ref Range   Smear Review SMEAR STAINED AND AVAILABLE FOR REVIEW     Comment: Performed at [place] [address], [place], [place] [address]  Vitamin B12     Status: None   Collection Time: 11/22/18  6:41 AM  Result Value Ref Range   Vitamin B-12 222 180 - 914 pg/mL    Comment: (NOTE) This assay is not validated for testing neonatal or myeloproliferative syndrome specimens for Vitamin B12 levels. Performed at [facility], [place] [address] [place], [place] [address]   Glucose, capillary     Status: Abnormal   Collection Time: 11/22/18  7:47 AM  Result Value Ref Range   Glucose-Capillary 116 (H) 70 - 99 mg/dL   Comment 1 Notify RN   Heparin level (unfractionated)     Status: None   Collection Time: 11/22/18  8:54 AM  Result Value Ref Range   Heparin Unfractionated 0.47 0.30 - 0.70 IU/mL    Comment: (NOTE) If heparin  results are below expected values, and patient  dosage has  been confirmed, suggest follow up testing of antithrombin III levels. Performed at [facility], [place] [address] [place], [place] [address]   Glucose, capillary     Status: Abnormal   Collection Time: 11/22/18 11:40 AM  Result Value Ref Range   Glucose-Capillary 102 (H) 70 - 99 mg/dL   Comment 1 Notify RN   Glucose, capillary     Status: None   Collection Time: 11/22/18  5:31 PM  Result Value Ref Range   Glucose-Capillary 91 70 - 99 mg/dL   Comment 1 Notify RN   Glucose, capillary     Status: Abnormal   Collection Time: 11/22/18 10:09 PM  Result Value Ref Range   Glucose-Capillary 151 (H) 70 - 99 mg/dL  Hepatitis c antibody (reflex)     Status: None   Collection Time: 11/23/18  5:18 AM  Result Value Ref Range   HCV Ab <0.1 0.0 - 0.9 s/co ratio    Comment: (NOTE) Performed At: [facility] [place], [place] [id number] Rush Farmer MD [id number]   Basic metabolic panel     Status: Abnormal   Collection Time: 11/23/18  5:18 AM  Result Value Ref Range   Sodium 141 135 - 145 mmol/L   Potassium 3.9 3.5 - 5.1 mmol/L   Chloride 107 98 - 111 mmol/L   CO2 25 22 - 32 mmol/L   Glucose, Bld 105 (H) 70 - 99 mg/dL   BUN 12 6 - 20 mg/dL   Creatinine, Ser 0.97 0.61 - 1.24 mg/dL   Calcium 8.8 (L) 8.9 - 10.3 mg/dL   GFR calc non Af Amer >60 >60 mL/min   GFR calc Af Amer >60 >60 mL/min   Anion gap 9 5 - 15    Comment: Performed at [facility], [place] [address] [place], [place] [address]  CBC     Status: Abnormal   Collection Time: 11/23/18  5:18 AM  Result Value Ref Range   WBC 2.7 (L) 4.0 - 10.5 K/uL   RBC 4.29 4.22 - 5.81 MIL/uL   Hemoglobin 13.5 13.0 - 17.0 g/dL   HCT 39.0 39.0 - 52.0 %   MCV 90.9 80.0 - 100.0 fL   MCH 31.5 26.0 - 34.0 pg   MCHC 34.6 30.0 - 36.0 g/dL   RDW 13.2 11.5 - 15.5 %   Platelets 73 (L) 150 - 400 K/uL    Comment: REPEATED TO VERIFY Immature Platelet Fraction may be clinically indicated,  consider ordering this additional test [id number] CONSISTENT WITH PREVIOUS RESULT    nRBC 0.0 0.0 - 0.2 %    Comment: Performed at [facility], [place] [address], [place], [place] [address]  HCV Comment:     Status: None   Collection Time: 11/23/18  5:18 AM  Result Value Ref Range   Comment: Comment     Comment: (NOTE) Non reactive HCV antibody screen is consistent with no HCV infection, unless recent infection is suspected or other evidence exists to indicate HCV infection. Performed At: [facility] [facility] [place], [place] [id number] Rush Farmer MD [id number]   Glucose, capillary     Status: None   Collection Time: 11/23/18  8:00 AM  Result Value Ref Range   Glucose-Capillary 78 70 - 99 mg/dL   Comment 1 Notify RN   Glucose, capillary     Status: Abnormal   Collection Time: 11/23/18 11:27 AM  Result Value Ref Range   Glucose-Capillary 144 (H) 70 - 99 mg/dL   Comment 1 Notify RN   Urinalysis, Routine w reflex microscopic     Status: Abnormal   Collection Time: 11/23/18  8:51 PM  Result Value Ref Range   Color, Urine YELLOW YELLOW   APPearance CLEAR CLEAR   Specific Gravity, Urine 1.014 1.005 - 1.030   pH 8.0 5.0 - 8.0   Glucose, UA NEGATIVE NEGATIVE mg/dL   Hgb urine dipstick NEGATIVE NEGATIVE   Bilirubin Urine NEGATIVE NEGATIVE   Ketones, ur NEGATIVE NEGATIVE mg/dL   Protein, ur 100 (A) NEGATIVE mg/dL   Nitrite NEGATIVE NEGATIVE   Leukocytes, UA NEGATIVE NEGATIVE   RBC / HPF 0-5 0 - 5 RBC/hpf   WBC, UA 0-5 0 - 5 WBC/hpf   Bacteria, UA RARE (A) NONE SEEN   Mucus PRESENT     Comment: Performed at [facility], [address], [place], [place] [address]  Lipase, blood     Status: None   Collection Time: 11/23/18  8:58 PM  Result Value Ref Range   Lipase 36 11 - 51 U/L    Comment: Performed at [facility], [address], [place], [place] [address]  Comprehensive metabolic panel     Status: Abnormal   Collection Time: 11/23/18  8:58 PM   Result Value Ref Range   Sodium 136 135 - 145 mmol/L   Potassium 3.6 3.5 - 5.1 mmol/L   Chloride 105 98 - 111 mmol/L   CO2 19 (L) 22 - 32 mmol/L   Glucose, Bld 171 (H) 70 - 99 mg/dL   BUN 17 6 - 20 mg/dL   Creatinine, Ser 0.81 0.61 - 1.24 mg/dL   Calcium 9.5 8.9 - 10.3 mg/dL   Total Protein 8.4 (H) 6.5 - 8.1 g/dL   Albumin 4.1 3.5 - 5.0 g/dL   AST 38 15 - 41 U/L   ALT 37 0 - 44 U/L   Alkaline Phosphatase 58 38 - 126 U/L   Total Bilirubin 0.8 0.3 - 1.2 mg/dL   GFR calc non Af Amer >60 >60 mL/min   GFR calc Af Amer >60 >60 mL/min   Anion gap 12 5 - 15    Comment: Performed at [facility], [address] [place], [place] [address]  CBC     Status: Abnormal   Collection Time: 11/23/18  8:58 PM  Result Value Ref Range   WBC 5.6 4.0 - 10.5 K/uL   RBC 5.35 4.22 - 5.81 MIL/uL   Hemoglobin 16.4 13.0 - 17.0 g/dL   HCT 47.4 39.0 - 52.0 %   MCV 88.6 80.0 - 100.0 fL   MCH 30.7 26.0 - 34.0 pg   MCHC 34.6 30.0 - 36.0 g/dL   RDW 13.1 11.5 - 15.5 %   Platelets 76 (L) 150 - 400 K/uL    Comment: PLATELET COUNT CONFIRMED BY SMEAR SPECIMEN CHECKED FOR CLOTS Immature Platelet Fraction may be clinically indicated, consider ordering this additional test [id number]    nRBC 0.0 0.0 - 0.2 %    Comment: Performed at [facility], [address], [place], [place] [address]  Troponin I - Once     Status: None   Collection Time: 11/23/18  8:58 PM  Result Value Ref Range   Troponin I <0.03 <0.03 ng/mL    Comment: Performed at [facility], [address] [place], [place] [address]  Lipase, blood     Status: None   Collection Time: 11/24/18  1:23 PM  Result Value Ref Range   Lipase 26 11 - 51 U/L    Comment: Performed at [facility], [place] [address] [place], [place] [address]  Comprehensive metabolic panel     Status: Abnormal   Collection Time: 11/24/18  1:23 PM  Result Value Ref Range   Sodium 136 135 - 145 mmol/L   Potassium 4.4 3.5 - 5.1 mmol/L   Chloride 100 98 - 111 mmol/L   CO2  22 22 - 32 mmol/L   Glucose, Bld 230 (H) 70 - 99 mg/dL   BUN 15 6 - 20 mg/dL   Creatinine, Ser 1.32 (H) 0.61 - 1.24 mg/dL   Calcium 9.3 8.9 - 10.3 mg/dL   Total Protein 7.9 6.5 - 8.1 g/dL   Albumin 3.8 3.5 - 5.0 g/dL   AST 40 15 - 41 U/L   ALT 33 0 - 44 U/L   Alkaline Phosphatase 49 38 - 126 U/L   Total Bilirubin 1.3 (H) 0.3 - 1.2 mg/dL   GFR calc non Af Amer 58 (L) >60 mL/min   GFR calc Af Amer >60 >60 mL/min   Anion gap 14 5 - 15    Comment: Performed at [facility] [address], [place], [place] [address]  CBC     Status: Abnormal   Collection Time: 11/24/18  1:23 PM  Result Value Ref Range   WBC 10.3 4.0 - 10.5 K/uL   RBC 5.39 4.22 - 5.81 MIL/uL   Hemoglobin 16.4 13.0 - 17.0 g/dL   HCT 48.9 39.0 - 52.0 %   MCV 90.7 80.0 - 100.0 fL   MCH 30.4 26.0 - 34.0 pg   MCHC 33.5 30.0 - 36.0 g/dL   RDW 13.4 11.5 - 15.5 %   Platelets 101 (L) 150 - 400 K/uL    Comment: REPEATED TO VERIFY Immature Platelet Fraction may be clinically indicated, consider ordering this additional test [id number] CONSISTENT WITH PREVIOUS RESULT    nRBC 0.0 0.0 - 0.2 %    Comment: Performed at [facility], [place] [address], [place], [place] [address]  I-stat troponin, ED     Status: None   Collection Time: 11/24/18  1:32 PM  Result Value Ref Range   Troponin i, poc 0.02 0.00 - 0.08 ng/mL   Comment 3            Comment: Due to the release kinetics of cTnI, a negative result within the first hours of the onset of symptoms does not rule out myocardial infarction with certainty. If myocardial infarction is still suspected, repeat the test at appropriate intervals.   Urinalysis, Routine w reflex microscopic     Status: Abnormal   Collection Time: 11/25/18  9:44 PM  Result Value Ref Range   Color, Urine AMBER (A) YELLOW    Comment: BIOCHEMICALS MAY BE AFFECTED BY COLOR   APPearance HAZY (A) CLEAR   Specific Gravity, Urine 1.028 1.005 - 1.030   pH 5.0 5.0 - 8.0   Glucose, UA NEGATIVE  NEGATIVE mg/dL   Hgb urine dipstick SMALL (A) NEGATIVE   Bilirubin Urine NEGATIVE NEGATIVE   Ketones, ur NEGATIVE NEGATIVE mg/dL   Protein, ur 100 (A) NEGATIVE mg/dL   Nitrite NEGATIVE NEGATIVE   Leukocytes, UA NEGATIVE NEGATIVE   RBC / HPF 0-5 0 - 5 RBC/hpf   WBC, UA 0-5 0 - 5 WBC/hpf   Bacteria, UA NONE SEEN NONE SEEN   Squamous Epithelial / LPF 0-5  0 - 5   Mucus PRESENT     Comment: Performed at [facility], [address] [place], [place] [address]  Influenza panel by PCR (type A & B)     Status: None   Collection Time: 11/25/18  9:46 PM  Result Value Ref Range   Influenza A By PCR NEGATIVE NEGATIVE   Influenza B By PCR NEGATIVE NEGATIVE    Comment: (NOTE) The Xpert Xpress Flu assay is intended as an aid in the diagnosis of  influenza and should not be used as a sole basis for treatment.  This  assay is FDA approved for nasopharyngeal swab specimens only. Nasal  washings and aspirates are unacceptable for Xpert Xpress Flu testing. Performed at [facility], [address], [place], [place] [address]   Lipase, blood     Status: None   Collection Time: 11/25/18 10:00 PM  Result Value Ref Range   Lipase 22 11 - 51 U/L    Comment: Performed at [facility], [address] [place], [place] [address]  Comprehensive metabolic panel     Status: Abnormal   Collection Time: 11/25/18 10:00 PM  Result Value Ref Range   Sodium 132 (L) 135 - 145 mmol/L   Potassium 3.6 3.5 - 5.1 mmol/L   Chloride 100 98 - 111 mmol/L   CO2 24 22 - 32 mmol/L   Glucose, Bld 162 (H) 70 - 99 mg/dL   BUN 32 (H) 6 - 20 mg/dL   Creatinine, Ser 1.41 (H) 0.61 - 1.24 mg/dL   Calcium 8.6 (L) 8.9 - 10.3 mg/dL   Total Protein 8.4 (H) 6.5 - 8.1 g/dL   Albumin 3.8 3.5 - 5.0 g/dL   AST 30 15 - 41 U/L   ALT 33 0 - 44 U/L   Alkaline Phosphatase 44 38 - 126 U/L   Total Bilirubin 1.9 (H) 0.3 - 1.2 mg/dL   GFR calc non Af Amer 54 (L) >60 mL/min   GFR calc Af Amer >60 >60 mL/min   Anion gap 8 5 - 15    Comment:  Performed at [facility], [address], [place], [place] [address]  CBC with Differential/Platelet     Status: Abnormal   Collection Time: 11/25/18 10:00 PM  Result Value Ref Range   WBC 8.5 4.0 - 10.5 K/uL   RBC 5.05 4.22 - 5.81 MIL/uL   Hemoglobin 15.4 13.0 - 17.0 g/dL   HCT 47.1 39.0 - 52.0 %   MCV 93.3 80.0 - 100.0 fL   MCH 30.5 26.0 - 34.0 pg   MCHC 32.7 30.0 - 36.0 g/dL   RDW 14.2 11.5 - 15.5 %   Platelets 93 (L) 150 - 400 K/uL    Comment: SPECIMEN CHECKED FOR CLOTS   nRBC 0.0 0.0 - 0.2 %   Neutrophils Relative % 79 %   Neutro Abs 6.6 1.7 - 7.7 K/uL   Lymphocytes Relative 13 %   Lymphs Abs 1.1 0.7 - 4.0 K/uL   Monocytes Relative 6 %   Monocytes Absolute 0.5 0.1 - 1.0 K/uL   Eosinophils Relative 0 %   Eosinophils Absolute 0.0 0.0 - 0.5 K/uL   Basophils Relative 0 %   Basophils Absolute 0.0 0.0 - 0.1 K/uL   Immature Granulocytes 2 %   Abs Immature Granulocytes 0.17 (H) 0.00 - 0.07 K/uL    Comment: Performed at [facility], [address] [place], [place] [address]  Hemoglobin A1c     Status: Abnormal   Collection  Time: 11/26/18  5:26 AM  Result Value Ref Range   Hgb A1c MFr Bld 7.2 (H) 4.8 - 5.6 %    Comment: (NOTE) Pre diabetes:          5.7%-6.4% Diabetes:              >6.4% Glycemic control for   <7.0% adults with diabetes    Mean Plasma Glucose 159.94 mg/dL    Comment: Performed at [place] [address] [place], [place] [address]  CBC     Status: Abnormal   Collection Time: 11/26/18  5:26 AM  Result Value Ref Range   WBC 7.5 4.0 - 10.5 K/uL   RBC 4.43 4.22 - 5.81 MIL/uL   Hemoglobin 13.6 13.0 - 17.0 g/dL   HCT 41.1 39.0 - 52.0 %   MCV 92.8 80.0 - 100.0 fL   MCH 30.7 26.0 - 34.0 pg   MCHC 33.1 30.0 - 36.0 g/dL   RDW 13.8 11.5 - 15.5 %   Platelets 82 (L) 150 - 400 K/uL    Comment: SPECIMEN CHECKED FOR CLOTS Immature Platelet Fraction may be clinically indicated, consider ordering this additional test [id number] CONSISTENT WITH PREVIOUS  RESULT    nRBC 0.0 0.0 - 0.2 %    Comment: Performed at [facility], [address], [place], [place] [address]  Comprehensive metabolic panel     Status: Abnormal   Collection Time: 11/26/18  5:26 AM  Result Value Ref Range   Sodium 137 135 - 145 mmol/L   Potassium 3.7 3.5 - 5.1 mmol/L   Chloride 104 98 - 111 mmol/L   CO2 23 22 - 32 mmol/L   Glucose, Bld 154 (H) 70 - 99 mg/dL   BUN 30 (H) 6 - 20 mg/dL   Creatinine, Ser 1.28 (H) 0.61 - 1.24 mg/dL   Calcium 8.1 (L) 8.9 - 10.3 mg/dL   Total Protein 6.8 6.5 - 8.1 g/dL   Albumin 3.1 (L) 3.5 - 5.0 g/dL   AST 23 15 - 41 U/L   ALT 27 0 - 44 U/L   Alkaline Phosphatase 35 (L) 38 - 126 U/L   Total Bilirubin 1.7 (H) 0.3 - 1.2 mg/dL   GFR calc non Af Amer >60 >60 mL/min   GFR calc Af Amer >60 >60 mL/min   Anion gap 10 5 - 15    Comment: Performed at [facility], [address], [place], [place] [address]  CBG monitoring, ED     Status: Abnormal   Collection Time: 11/26/18  6:19 AM  Result Value Ref Range   Glucose-Capillary 150 (H) 70 - 99 mg/dL   Comment 1 Notify RN    Comment 2 Document in Chart   MRSA PCR Screening     Status: None   Collection Time: 11/26/18  7:51 AM  Result Value Ref Range   MRSA by PCR NEGATIVE NEGATIVE    Comment:        The GeneXpert MRSA Assay (FDA approved for NASAL specimens only), is one component of a comprehensive MRSA colonization surveillance program. It is not intended to diagnose MRSA infection nor to guide or monitor treatment for MRSA infections. Performed at [facility], [address], [place],  [address]   Glucose, capillary     Status: Abnormal   Collection Time: 11/26/18 11:06 AM  Result Value Ref Range   Glucose-Capillary 123 (H) 70 - 99 mg/dL  Glucose, capillary     Status: Abnormal   Collection  Time: 11/26/18  4:20 PM  Result Value Ref Range   Glucose-Capillary 128 (H) 70 - 99 mg/dL  Glucose, capillary     Status: Abnormal   Collection Time: 11/26/18 11:27 PM  Result  Value Ref Range   Glucose-Capillary 151 (H) 70 - 99 mg/dL   Comment 1 Notify RN    Comment 2 Document in Chart   CBC     Status: Abnormal   Collection Time: 11/27/18  4:42 AM  Result Value Ref Range   WBC 6.9 4.0 - 10.5 K/uL   RBC 4.16 (L) 4.22 - 5.81 MIL/uL   Hemoglobin 12.8 (L) 13.0 - 17.0 g/dL   HCT 39.0 39.0 - 52.0 %   MCV 93.8 80.0 - 100.0 fL   MCH 30.8 26.0 - 34.0 pg   MCHC 32.8 30.0 - 36.0 g/dL   RDW 13.6 11.5 - 15.5 %   Platelets 95 (L) 150 - 400 K/uL    Comment: REPEATED TO VERIFY SPECIMEN CHECKED FOR CLOTS Immature Platelet Fraction may be clinically indicated, consider ordering this additional test [id number] CONSISTENT WITH PREVIOUS RESULT    nRBC 0.0 0.0 - 0.2 %    Comment: Performed at [facility], [address], [place], [place] [address]  Comprehensive metabolic panel     Status: Abnormal   Collection Time: 11/27/18  4:42 AM  Result Value Ref Range   Sodium 135 135 - 145 mmol/L   Potassium 3.5 3.5 - 5.1 mmol/L   Chloride 100 98 - 111 mmol/L   CO2 26 22 - 32 mmol/L   Glucose, Bld 195 (H) 70 - 99 mg/dL   BUN 33 (H) 6 - 20 mg/dL   Creatinine, Ser 1.56 (H) 0.61 - 1.24 mg/dL   Calcium 8.2 (L) 8.9 - 10.3 mg/dL   Total Protein 6.7 6.5 - 8.1 g/dL   Albumin 2.9 (L) 3.5 - 5.0 g/dL   AST 24 15 - 41 U/L   ALT 24 0 - 44 U/L   Alkaline Phosphatase 41 38 - 126 U/L   Total Bilirubin 1.4 (H) 0.3 - 1.2 mg/dL   GFR calc non Af Amer 48 (L) >60 mL/min   GFR calc Af Amer 55 (L) >60 mL/min   Anion gap 9 5 - 15    Comment: Performed at [facility], [address], [place], [place] [address]  Glucose, capillary     Status: Abnormal   Collection Time: 11/27/18  5:04 AM  Result Value Ref Range   Glucose-Capillary 183 (H) 70 - 99 mg/dL   Comment 1 Notify RN    Comment 2 Document in Chart   Glucose, capillary     Status: Abnormal   Collection Time: 11/27/18  9:10 AM  Result Value Ref Range   Glucose-Capillary 165 (H) 70 - 99 mg/dL  Glucose, capillary     Status: Abnormal    Collection Time: 11/27/18 11:24 AM  Result Value Ref Range   Glucose-Capillary 262 (H) 70 - 99 mg/dL  Glucose, capillary     Status: Abnormal   Collection Time: 11/27/18  4:27 PM  Result Value Ref Range   Glucose-Capillary 193 (H) 70 - 99 mg/dL  Glucose, capillary     Status: Abnormal   Collection Time: 11/27/18  8:55 PM  Result Value Ref Range   Glucose-Capillary 129 (H) 70 - 99 mg/dL  CBC     Status: Abnormal   Collection Time: 11/28/18  6:05 AM  Result Value Ref Range   WBC 4.8 4.0 -  10.5 K/uL   RBC 3.93 (L) 4.22 - 5.81 MIL/uL   Hemoglobin 12.0 (L) 13.0 - 17.0 g/dL   HCT 36.8 (L) 39.0 - 52.0 %   MCV 93.6 80.0 - 100.0 fL   MCH 30.5 26.0 - 34.0 pg   MCHC 32.6 30.0 - 36.0 g/dL   RDW 13.6 11.5 - 15.5 %   Platelets 96 (L) 150 - 400 K/uL    Comment: SPECIMEN CHECKED FOR CLOTS Immature Platelet Fraction may be clinically indicated, consider ordering this additional test [id number] CONSISTENT WITH PREVIOUS RESULT    nRBC 0.0 0.0 - 0.2 %    Comment: Performed at [facility], [address] [place], [place] [address]  Comprehensive metabolic panel     Status: Abnormal   Collection Time: 11/28/18  6:05 AM  Result Value Ref Range   Sodium 136 135 - 145 mmol/L   Potassium 3.4 (L) 3.5 - 5.1 mmol/L   Chloride 101 98 - 111 mmol/L   CO2 24 22 - 32 mmol/L   Glucose, Bld 105 (H) 70 - 99 mg/dL   BUN 25 (H) 6 - 20 mg/dL   Creatinine, Ser 1.33 (H) 0.61 - 1.24 mg/dL   Calcium 8.2 (L) 8.9 - 10.3 mg/dL   Total Protein 6.8 6.5 - 8.1 g/dL   Albumin 2.8 (L) 3.5 - 5.0 g/dL   AST 23 15 - 41 U/L   ALT 22 0 - 44 U/L   Alkaline Phosphatase 47 38 - 126 U/L   Total Bilirubin 1.1 0.3 - 1.2 mg/dL   GFR calc non Af Amer 58 (L) >60 mL/min   GFR calc Af Amer >60 >60 mL/min   Anion gap 11 5 - 15    Comment: Performed at [facility], [address], [place], [place] [address]  Glucose, capillary     Status: None   Collection Time: 11/28/18  9:59 AM  Result Value Ref Range   Glucose-Capillary 87 70 -  99 mg/dL  Glucose, capillary     Status: None   Collection Time: 11/28/18  1:53 PM  Result Value Ref Range   Glucose-Capillary 94 70 - 99 mg/dL  Glucose, capillary     Status: Abnormal   Collection Time: 11/28/18  5:08 PM  Result Value Ref Range   Glucose-Capillary 118 (H) 70 - 99 mg/dL  Glucose, capillary     Status: Abnormal   Collection Time: 11/28/18 10:11 PM  Result Value Ref Range   Glucose-Capillary 265 (H) 70 - 99 mg/dL  CBC with Differential/Platelet     Status: Abnormal   Collection Time: 11/29/18  4:19 AM  Result Value Ref Range   WBC 5.0 4.0 - 10.5 K/uL   RBC 3.83 (L) 4.22 - 5.81 MIL/uL   Hemoglobin 12.0 (L) 13.0 - 17.0 g/dL   HCT 36.2 (L) 39.0 - 52.0 %   MCV 94.5 80.0 - 100.0 fL   MCH 31.3 26.0 - 34.0 pg   MCHC 33.1 30.0 - 36.0 g/dL   RDW 13.2 11.5 - 15.5 %   Platelets 112 (L) 150 - 400 K/uL    Comment: SPECIMEN CHECKED FOR CLOTS Immature Platelet Fraction may be clinically indicated, consider ordering this additional test [id number] CONSISTENT WITH PREVIOUS RESULT    nRBC 0.0 0.0 - 0.2 %   Neutrophils Relative % 86 %   Neutro Abs 4.3 1.7 - 7.7 K/uL   Lymphocytes Relative 6 %   Lymphs Abs 0.3 (L) 0.7 - 4.0 K/uL   Monocytes Relative 7 %  Monocytes Absolute 0.4 0.1 - 1.0 K/uL   Eosinophils Relative 0 %   Eosinophils Absolute 0.0 0.0 - 0.5 K/uL   Basophils Relative 0 %   Basophils Absolute 0.0 0.0 - 0.1 K/uL   Immature Granulocytes 1 %   Abs Immature Granulocytes 0.07 0.00 - 0.07 K/uL    Comment: Performed at [facility], [address] [place], [place] [address]  Comprehensive metabolic panel     Status: Abnormal   Collection Time: 11/29/18  4:19 AM  Result Value Ref Range   Sodium 136 135 - 145 mmol/L   Potassium 4.3 3.5 - 5.1 mmol/L    Comment: DELTA CHECK NOTED   Chloride 103 98 - 111 mmol/L   CO2 24 22 - 32 mmol/L   Glucose, Bld 259 (H) 70 - 99 mg/dL   BUN 16 6 - 20 mg/dL   Creatinine, Ser 1.02 0.61 - 1.24 mg/dL   Calcium 8.2 (L) 8.9 - 10.3  mg/dL   Total Protein 6.4 (L) 6.5 - 8.1 g/dL   Albumin 2.5 (L) 3.5 - 5.0 g/dL   AST 40 15 - 41 U/L   ALT 27 0 - 44 U/L   Alkaline Phosphatase 42 38 - 126 U/L   Total Bilirubin 0.5 0.3 - 1.2 mg/dL   GFR calc non Af Amer >60 >60 mL/min   GFR calc Af Amer >60 >60 mL/min   Anion gap 9 5 - 15    Comment: Performed at [facility], [address], [place], [place] [address]  Glucose, capillary     Status: Abnormal   Collection Time: 11/29/18  7:34 AM  Result Value Ref Range   Glucose-Capillary 225 (H) 70 - 99 mg/dL  Glucose, capillary     Status: Abnormal   Collection Time: 11/29/18 11:22 AM  Result Value Ref Range   Glucose-Capillary 221 (H) 70 - 99 mg/dL  Glucose, capillary     Status: Abnormal   Collection Time: 11/29/18  4:19 PM  Result Value Ref Range   Glucose-Capillary 237 (H) 70 - 99 mg/dL   Comment 1 Notify RN    Comment 2 Document in Chart   Glucose, capillary     Status: Abnormal   Collection Time: 11/29/18 10:13 PM  Result Value Ref Range   Glucose-Capillary 293 (H) 70 - 99 mg/dL   Comment 1 Notify RN    Comment 2 Document in Chart   CBC with Differential/Platelet     Status: Abnormal   Collection Time: 11/30/18  6:04 AM  Result Value Ref Range   WBC 4.7 4.0 - 10.5 K/uL   RBC 3.57 (L) 4.22 - 5.81 MIL/uL   Hemoglobin 11.1 (L) 13.0 - 17.0 g/dL   HCT 33.5 (L) 39.0 - 52.0 %   MCV 93.8 80.0 - 100.0 fL   MCH 31.1 26.0 - 34.0 pg   MCHC 33.1 30.0 - 36.0 g/dL   RDW 13.2 11.5 - 15.5 %   Platelets 132 (L) 150 - 400 K/uL   nRBC 0.0 0.0 - 0.2 %   Neutrophils Relative % 78 %   Neutro Abs 3.7 1.7 - 7.7 K/uL   Lymphocytes Relative 13 %   Lymphs Abs 0.6 (L) 0.7 - 4.0 K/uL   Monocytes Relative 8 %   Monocytes Absolute 0.4 0.1 - 1.0 K/uL   Eosinophils Relative 0 %   Eosinophils Absolute 0.0 0.0 - 0.5 K/uL   Basophils Relative 0 %   Basophils Absolute 0.0 0.0 - 0.1 K/uL  Immature Granulocytes 1 %   Abs Immature Granulocytes 0.03 0.00 - 0.07 K/uL    Comment: Performed at  [facility], [address] [place], [place] [address]  Comprehensive metabolic panel     Status: Abnormal   Collection Time: 11/30/18  6:04 AM  Result Value Ref Range   Sodium 137 135 - 145 mmol/L   Potassium 3.7 3.5 - 5.1 mmol/L   Chloride 104 98 - 111 mmol/L   CO2 26 22 - 32 mmol/L   Glucose, Bld 219 (H) 70 - 99 mg/dL   BUN 22 (H) 6 - 20 mg/dL   Creatinine, Ser 1.13 0.61 - 1.24 mg/dL   Calcium 8.1 (L) 8.9 - 10.3 mg/dL   Total Protein 6.3 (L) 6.5 - 8.1 g/dL   Albumin 2.4 (L) 3.5 - 5.0 g/dL   AST 35 15 - 41 U/L   ALT 29 0 - 44 U/L   Alkaline Phosphatase 46 38 - 126 U/L   Total Bilirubin 0.5 0.3 - 1.2 mg/dL   GFR calc non Af Amer >60 >60 mL/min   GFR calc Af Amer >60 >60 mL/min   Anion gap 7 5 - 15    Comment: Performed at [facility], [address], [place], [place] [address]  Glucose, capillary     Status: Abnormal   Collection Time: 11/30/18  7:48 AM  Result Value Ref Range   Glucose-Capillary 178 (H) 70 - 99 mg/dL   Comment 1 Notify RN    Comment 2 Document in Chart   Glucose, capillary     Status: Abnormal   Collection Time: 11/30/18 11:25 AM  Result Value Ref Range   Glucose-Capillary 183 (H) 70 - 99 mg/dL   Comment 1 Notify RN    Comment 2 Document in Chart   Glucose, capillary     Status: Abnormal   Collection Time: 11/30/18  4:49 PM  Result Value Ref Range   Glucose-Capillary 123 (H) 70 - 99 mg/dL   Comment 1 Notify RN    Comment 2 Document in Chart   Ceruloplasmin     Status: None   Collection Time: 12/02/18  9:42 AM  Result Value Ref Range   Ceruloplasmin 35 18 - 36 mg/dL  Alpha-1-antitrypsin     Status: Abnormal   Collection Time: 12/02/18  9:42 AM  Result Value Ref Range   A-1 Antitrypsin, Ser 267 (H) 83 - 199 mg/dL  Antinuclear Antib (ANA)     Status: None   Collection Time: 12/02/18  9:42 AM  Result Value Ref Range   Anti Nuclear Antibody(ANA) NEGATIVE NEGATIVE    Comment: ANA IFA is a first line screen for detecting the presence of up to  approximately 150 autoantibodies in various autoimmune diseases. A negative ANA IFA result suggests an ANA-associated autoimmune disease is not present at this time, but is not definitive. If there is high clinical suspicion for Sjogren's syndrome, testing for anti-SS-A/Ro antibody should be considered. Anti-Jo-1 antibody should be considered for clinically suspected inflammatory myopathies. . AC-0: Negative . International Consensus on ANA Patterns (https://www.hernandez-brewer.[email or web address]/) . For additional information, please refer to http://education.[facility].com/faq/FAQ177 (This link is being provided for informational/ educational purposes only.) .   IgG     Status: None   Collection Time: 12/02/18  9:42 AM  Result Value Ref Range   IgG (Immunoglobin G), Serum 1,357 600 - 1,640 mg/dL  Ferritin     Status: None   Collection Time: 12/02/18  9:42 AM  Result Value Ref Range  Ferritin 228 24 - 380 ng/mL  INR/PT     Status: Abnormal   Collection Time: 12/02/18  9:42 AM  Result Value Ref Range   INR 1.1     Comment: Reference Range                     0.9-1.1 Moderate-intensity Warfarin Therapy 2.0-3.0 Higher-intensity Warfarin Therapy   3.0-4.0  .    Prothrombin Time 11.7 (H) 9.0 - 11.5 sec    Comment: . For more information on this test, go to: http://education.[facility].com/faq/FAQ104 .   Sedimentation rate     Status: Abnormal   Collection Time: 12/02/18  9:42 AM  Result Value Ref Range   Sed Rate 89 (H) 0 - 20 mm/h  AFP tumor marker     Status: None   Collection Time: 12/02/18  9:42 AM  Result Value Ref Range   AFP-Tumor Marker 1.5 <6.1 ng/mL    Comment: . This test was performed using the Beckman Coulter chemiluminescent method. Values obtained from different assay methods cannot be used interchangeably. AFP levels, regardless of value, should not be interpreted as absolute evidence of the presence or absence of disease. .   Hepatitis  panel, acute     Status: None   Collection Time: 12/02/18  9:42 AM  Result Value Ref Range   Hep A IgM NON-REACTIVE NON-REACTI   Hepatitis B Surface Ag NON-REACTIVE NON-REACTI   Hep B C IgM NON-REACTIVE NON-REACTI   Hepatitis C Ab NON-REACTIVE NON-REACTI   SIGNAL TO CUT-OFF 0.01 <1.00    Comment: . HCV antibody was non-reactive. There is no laboratory  evidence of HCV infection. . In most cases, no further action is required. However, if recent HCV exposure is suspected, a test for HCV RNA (test code [phone]) is suggested. . For additional information please refer to http://education.[facility].com/faq/FAQ22v1 (This link is being provided for informational/ educational purposes only.) . For additional information, please refer to  http://education.[facility].com/faq/FAQ202  (This link is being provided for informational/ educational purposes only.)   Iron,Total/Total Iron Binding Cap     Status: Abnormal   Collection Time: 12/02/18  9:42 AM  Result Value Ref Range   Iron 47 (L) 50 - 180 mcg/dL   TIBC 258 250 - 425 mcg/dL (calc)   %SAT 18 (L) 20 - 48 % (calc)    RADIOGRAPHIC STUDIES: I have personally reviewed the radiological images as listed and agreed with the findings in the report. Nm Hepatobiliary Liver Func  Result Date: 11/28/2018 CLINICAL DATA:  Right upper quadrant abdominal pain and nausea for 1 week. The gallbladder was distended with no visible gallstones on a recent ultrasound. EXAM: NUCLEAR MEDICINE HEPATOBILIARY IMAGING TECHNIQUE: Sequential images of the abdomen were obtained out to 60 minutes following intravenous administration of radiopharmaceutical. RADIOPHARMACEUTICALS:  4.75 mCi Tc-47m Choletec IV COMPARISON:  Right upper quadrant abdomen ultrasound dated 11/24/2018 and abdomen and pelvis CT dated 11/23/2018. FINDINGS: Prompt uptake and biliary excretion of activity by the liver is seen. Biliary activity passes into small bowel, consistent with  patent common bile duct. The gallbladder was not visualized up to 90 minutes post injection despite the intravenous administration of 4 mg of morphine. IMPRESSION: Nonvisualized gallbladder, compatible with cystic duct obstruction. Electronically Signed   By: SClaudie ReveringM.D.   On: 11/28/2018 10:08   I have reviewed RFrancene Finders NP's note and agree with the documentation.  I personally performed a face-to-face visit, made revisions and my assessment and plan is  as follows.  ASSESSMENT & PLAN:  Thrombocytopenia ([place]) 1.  Thrombocytopenia: -He has mild thrombocytopenia since 2009. -Ultrasound of the abdomen on 11/22/2018 shows borderline splenomegaly, 12.3 cm but elevated volume at 517 mL. - CT abdomen and pelvis on 11/23/2018 shows hepatic cirrhosis.  Spleen is normal in size without focal abnormality.  Hepatitis panel was negative. - We will do work-up for nutritional deficiencies including D98, folic acid, copper levels.  We will also do connective tissue disorder work-up including ANA, rheumatoid factor.  We will check an SPEP and H. pylori antibodies. -We will see him back in 4 weeks for follow-up.  2.  Normocytic anemia: -Normocytic anemia during recent admission to the hospital.  This is likely postsurgical. -Iron levels are borderline.  I have recommended him to take iron tablet daily along with stool softener.  We will check his stool for occult blood. -Last colonoscopy was more than 5 years ago done in [place].  We have made a referral to [facility]. -He also has intermittent elevated creatinine since 2017.  3.  Leukopenia: - He had mild leukopenia since 2009. - His white count has been in the normal range for the last 1 month.  He does not have any recurrent infections.  4.  Cirrhosis: -Cholecystectomy and liver biopsy on 11/28/2018 showed cirrhosis. - He will follow-up with Dr.Rehman.     All questions were answered. The patient knows to call the clinic with any problems,  questions or concerns.     Derek Jack, MD 12/26/18 2:03 PM

## 2018-12-26 NOTE — Assessment & Plan Note (Signed)
1.  Thrombocytopenia: -He has mild thrombocytopenia since 2009. -Ultrasound of the abdomen on 11/22/2018 shows borderline splenomegaly, 12.3 cm but elevated volume at 517 mL. - CT abdomen and pelvis on 11/23/2018 shows hepatic cirrhosis.  Spleen is normal in size without focal abnormality.  Hepatitis panel was negative. - We will do work-up for nutritional deficiencies including U54, folic acid, copper levels.  We will also do connective tissue disorder work-up including ANA, rheumatoid factor.  We will check an SPEP and H. pylori antibodies. -We will see him back in 4 weeks for follow-up.  2.  Normocytic anemia: -Normocytic anemia during recent admission to the hospital.  This is likely postsurgical. -Iron levels are borderline.  I have recommended him to take iron tablet daily along with stool softener.  We will check his stool for occult blood. -Last colonoscopy was more than 5 years ago done in [place].  We have made a referral to [facility]. -He also has intermittent elevated creatinine since 2017.  3.  Leukopenia: - He had mild leukopenia since 2009. - His white count has been in the normal range for the last 1 month.  He does not have any recurrent infections.  4.  Cirrhosis: -Cholecystectomy and liver biopsy on 11/28/2018 showed cirrhosis. - He will follow-up with Dr.Rehman.

## 2018-12-27 LAB — H PYLORI, IGM, IGG, IGA AB
H Pylori IgG: 0.15 Index Value (ref 0.00–0.79)
H. Pylogi, Iga Abs: <9 units (ref 0.0–8.9)

## 2018-12-27 LAB — PROTEIN ELECTROPHORESIS, SERUM
A/G Ratio: 0.7 (ref 0.7–1.7)
ALBUMIN ELP: 3.1 g/dL (ref 2.9–4.4)
Alpha-1-Globulin: 0.3 g/dL (ref 0.0–0.4)
Alpha-2-Globulin: 1 g/dL (ref 0.4–1.0)
Beta Globulin: 1.6 g/dL — ABNORMAL HIGH (ref 0.7–1.3)
Gamma Globulin: 1.8 g/dL (ref 0.4–1.8)
Globulin, Total: 4.7 g/dL — ABNORMAL HIGH (ref 2.2–3.9)
Total Protein ELP: 7.8 g/dL (ref 6.0–8.5)

## 2018-12-27 LAB — RHEUMATOID FACTOR: Rheumatoid fact SerPl-aCnc: 68.9 IU/mL — ABNORMAL HIGH (ref 0.0–13.9)

## 2018-12-27 LAB — VITAMIN D 25 HYDROXY (VIT D DEFICIENCY, FRACTURES): Vit D, 25-Hydroxy: 13.2 ng/mL — ABNORMAL LOW (ref 30.0–100.0)

## 2018-12-27 LAB — ANTINUCLEAR ANTIBODIES, IFA: ANA Ab, IFA: NEGATIVE

## 2018-12-28 LAB — COPPER, SERUM: Copper: 149 ug/dL (ref 72–166)

## 2019-01-23 ENCOUNTER — Encounter (HOSPITAL_COMMUNITY): Payer: Self-pay | Admitting: Hematology

## 2019-01-23 ENCOUNTER — Inpatient Hospital Stay (HOSPITAL_COMMUNITY): Payer: Medicare Other | Attending: Hematology | Admitting: Hematology

## 2019-01-23 ENCOUNTER — Other Ambulatory Visit: Payer: Self-pay

## 2019-01-23 VITALS — BP 118/61 | HR 59 | Temp 97.8°F | Resp 14 | Wt 224.5 lb

## 2019-01-23 DIAGNOSIS — E559 Vitamin D deficiency, unspecified: Secondary | ICD-10-CM | POA: Diagnosis not present

## 2019-01-23 DIAGNOSIS — D696 Thrombocytopenia, unspecified: Secondary | ICD-10-CM

## 2019-01-23 DIAGNOSIS — D61818 Other pancytopenia: Secondary | ICD-10-CM | POA: Insufficient documentation

## 2019-01-23 DIAGNOSIS — K7460 Unspecified cirrhosis of liver: Secondary | ICD-10-CM | POA: Diagnosis not present

## 2019-01-23 MED ORDER — ERGOCALCIFEROL 1.25 MG (50000 UT) PO CAPS: 50000.0000 [IU] | ORAL_CAPSULE | ORAL | 1 refills | Status: DC

## 2019-01-23 NOTE — Progress Notes (Signed)
[place], [place] [address]   CLINIC:  Medical Oncology/Hematology  PCP:  Terrial Rhodes, [place] [address] [phone]   REASON FOR VISIT:  Follow-up for Pancytopenia     INTERVAL HISTORY:  [name] 63 y.o. male returns for routine follow-up. He is here today with his wife. He states that he has been feeling ok since his last visit. Denies any nausea, vomiting, or diarrhea. Denies any new pains. Had not noticed any recent bleeding such as epistaxis, hematuria or hematochezia. Denies recent chest pain on exertion, shortness of breath on minimal exertion, pre-syncopal episodes, or palpitations. Denies any numbness or tingling in hands or feet. Denies any recent fevers, infections, or recent hospitalizations. Patient reports appetite at 25% and energy level at 50%.     REVIEW OF SYSTEMS:  Review of Systems  Constitutional: Positive for fatigue.  Gastrointestinal: Positive for diarrhea.  Psychiatric/Behavioral: Positive for depression.  All other systems reviewed and are negative.    PAST MEDICAL/SURGICAL HISTORY:  Past Medical History:  Diagnosis Date  . Anxiety   . Arthritis   . Asthma   . Cataract    Right eye  . Chronic lower back pain   . Coronary atherosclerosis of native coronary artery    a. LAD & OM stenting;  b. 2007 Cath: nonobs dzs, patent stents;  c. 07/2012 neg MV, EF 61%.  d. 11/20/14: [place] s/p  PCI w/ DES to pLAD and DES to OM1   . Essential hypertension, benign   . GERD (gastroesophageal reflux disease)   . Hearing loss of left ear   . History of gout   . History of hiatal hernia   . Hypercholesteremia   . Lumbar herniated disc   . MI, old   . Migraine   . OSA on CPAP   . 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 Thrombocytopenia ([place])   . TIA (transient ischemic attack) ~ 2010  . Type 2 diabetes mellitus ([place])     Past Surgical History:  Procedure Laterality Date  . ANTERIOR CERVICAL DECOMP/DISCECTOMY FUSION  1998   "C3-4"  . CARDIAC CATHETERIZATION  "several"  . CARDIAC CATHETERIZATION N/A 12/30/2015   Procedure: Left Heart Cath and Coronary Angiography;  Surgeon: [name], MD;  Location: [facility];  Service: Cardiovascular;  Laterality: N/A;  . CARPAL TUNNEL RELEASE Bilateral 2005  . CHOLECYSTECTOMY N/A 11/28/2018   Procedure: LAPAROSCOPIC CHOLECYSTECTOMY;  Surgeon: [name], MD;  Location: [facility];  Service: General;  Laterality: N/A;  . COLONOSCOPY    . CORONARY ANGIOPLASTY    . CORONARY ANGIOPLASTY WITH STENT PLACEMENT  2002; 2003; 11/20/2014   "I have 4 stents after today" (11/20/2014)  . CORONARY ARTERY BYPASS GRAFT N/A 01/09/2016   Procedure: CORONARY ARTERY BYPASS GRAFTING (CABG) X 3 UTILIZING RIGHT AND LEFT INTERNAL MAMMARY ARTERY AND ENDOSCOPICALLY HARVESTED SAPHENEOUS VEIN.;  Surgeon: Rexene Alberts, MD;  Location: [facility];  Service: Open Heart Surgery;  Laterality: N/A;  . ESOPHAGOGASTRODUODENOSCOPY    . KNEE SURGERY Left 02/2012   "scraped; open"  . LEFT HEART CATH AND CORS/GRAFTS ANGIOGRAPHY N/A 08/09/2017   Procedure: LEFT HEART CATH AND CORS/GRAFTS ANGIOGRAPHY;  Surgeon: [name], MD;  Location: [facility];  Service: Cardiovascular;  Laterality: N/A;  . LEFT HEART CATH AND CORS/GRAFTS ANGIOGRAPHY N/A 11/22/2018  Procedure: LEFT HEART CATH AND CORS/GRAFTS ANGIOGRAPHY;  Surgeon: [name], [name], MD;  Location: [facility];  Service: Cardiovascular;  Laterality: N/A;  . LEFT HEART CATHETERIZATION WITH CORONARY ANGIOGRAM N/A 07/20/2012   Procedure: LEFT HEART CATHETERIZATION WITH CORONARY ANGIOGRAM;  Surgeon: [name], MD;  Location: [facility];  Service: Cardiovascular;  Laterality: N/A;  . LEFT HEART CATHETERIZATION WITH CORONARY ANGIOGRAM N/A 11/20/2014   Procedure: LEFT HEART CATHETERIZATION WITH CORONARY ANGIOGRAM;  Surgeon: [name]  [name], MD;  Location: [facility] CATH LAB;  Service: Cardiovascular;  Laterality: N/A;  . LEFT HEART CATHETERIZATION WITH CORONARY ANGIOGRAM N/A 11/26/2014   Procedure: LEFT HEART CATHETERIZATION WITH CORONARY ANGIOGRAM;  Surgeon: [name], MD;  Location: [facility] C[facility];  Service: Cardiovascular;  Laterality: N/A;  . NEUROPLASTY / TRANSPOSITION ULNAR NERVE AT ELBOW Right ~ 2012  . PERCUTANEOUS CORONARY ROTOBLATOR INTERVENTION (PCI-R)  11/20/2014   Procedure: PERCUTANEOUS CORONARY ROTOBLATOR INTERVENTION (PCI-R);  Surgeon: [name], MD;  Location: [facility] CATH LAB;  Service: Cardiovascular;;  . SHOULDER ARTHROSCOPY Left ~ 2011  . TEE WITHOUT CARDIOVERSION N/A 01/09/2016   Procedure: TRANSESOPHAGEAL ECHOCARDIOGRAM (TEE);  Surgeon: Rexene Alberts, MD;  Location: [facility];  Service: Open Heart Surgery;  Laterality: N/A;     SOCIAL HISTORY:  Social History   Socioeconomic History  . Marital status: Married    Spouse name: [name]  . Number of children: 3  . Years of education: 32 th   . Highest education level: Not on file  Occupational History  . Occupation: Unemployed  Social Needs  . Financial resource strain: Not on file  . Food insecurity:    Worry: Not on file    Inability: Not on file  . Transportation needs:    Medical: Not on file    Non-medical: Not on file  Tobacco Use  . Smoking status: Never Smoker  . Smokeless tobacco: Never Used  Substance and Sexual Activity  . Alcohol use: No  . Drug use: No  . Sexual activity: Yes  Lifestyle  . Physical activity:    Days per week: Not on file    Minutes per session: Not on file  . Stress: Not on file  Relationships  . Social connections:    Talks on phone: Not on file    Gets together: Not on file    Attends religious service: Not on file    Active member of club or organization: Not on file    Attends meetings of clubs or organizations: Not on file    Relationship status: Not on file  . Intimate partner violence:    Fear of  current or ex partner: Not on file    Emotionally abused: Not on file    Physically abused: Not on file    Forced sexual activity: Not on file  Other Topics Concern  . Not on file  Social History Narrative   Patient lives at home with wife [name].   Patient works at [employer], Engineer, petroleum.   Patient has a 12 th grade education.    Patient has 3 children.        FAMILY HISTORY:  Family History  Problem Relation Age of Onset  . Stroke Mother   . Coronary artery disease Father 73  . Asthma Sister   . Multiple sclerosis Brother   . Heart disease Paternal Uncle   . Stroke Maternal Grandmother   . Heart attack Paternal Grandmother   . Cancer Paternal  Grandfather     CURRENT MEDICATIONS:  Outpatient Encounter Medications as of 01/23/2019  Medication Sig  . albuterol (PROVENTIL HFA;VENTOLIN HFA) 108 (90 BASE) MCG/ACT inhaler Inhale 2 puffs into the lungs every 6 (six) hours as needed for wheezing or shortness of breath.  [name] amLODipine (NORVASC) 5 MG tablet Take 5 mg by mouth at bedtime.  [name] aspirin EC 81 MG tablet Take 81 mg by mouth at bedtime.   . cetirizine (ZYRTEC) 10 MG tablet Take 10 mg by mouth at bedtime.  . citalopram (CELEXA) 20 MG tablet Take 20 mg by mouth at bedtime.   . ergocalciferol (VITAMIN D2) 1.25 MG (50000 UT) capsule Take 1 capsule (50,000 Units total) by mouth once a week.  . ezetimibe (ZETIA) 10 MG tablet Take 10 mg by mouth at bedtime.  . fluticasone (FLONASE) 50 MCG/ACT nasal spray Place 1 spray into the nose 2 (two) times daily.   . Fluticasone-Salmeterol (ADVAIR) 250-50 MCG/DOSE AEPB Inhale 1 puff into the lungs every 12 (twelve) hours.  . gabapentin (NEURONTIN) 300 MG capsule Take 300 mg by mouth 3 (three) times daily.  . insulin aspart (NOVOLOG FLEXPEN) 100 UNIT/ML FlexPen Inject 2-30 Units into the skin 3 (three) times daily as needed for high blood sugar (CBG >150). Per sliding scale written down at home   . isosorbide mononitrate (IMDUR) 30  MG 24 hr tablet Take 30 mg by mouth at bedtime.  . Melatonin 5 MG TABS Take 10 mg by mouth at bedtime.   . metFORMIN (GLUCOPHAGE) 1000 MG tablet Take 1 tablet (1,000 mg total) by mouth 2 (two) times daily. For the next 2 days as you did receive IV contrast, then resume on 11/26/2018  . nitroGLYCERIN (NITROSTAT) 0.4 MG SL tablet Place 1 tablet (0.4 mg total) under the tongue every 5 (five) minutes as needed for chest pain. (Patient not taking: Reported on 12/26/2018)  . omeprazole (PRILOSEC) 20 MG capsule Take 20 mg by mouth 2 (two) times daily before a meal.  . pravastatin (PRAVACHOL) 20 MG tablet Take 1 tablet (20 mg total) by mouth daily.  [name] topiramate (TOPAMAX) 25 MG capsule Take 25 mg by mouth 2 (two) times daily.  . TRESIBA FLEXTOUCH 100 UNIT/ML SOPN FlexTouch Pen Inject 60-70 Units into the skin See admin instructions. 60 units in the morning and 70 units at bedtime  . [DISCONTINUED] furosemide (LASIX) 20 MG tablet Take 1 tablet (20 mg total) by mouth daily. (Patient not taking: Reported on 12/26/2018)   No facility-administered encounter medications on file as of 01/23/2019.     ALLERGIES:  Allergies  Allergen Reactions  . Divalproex Sodium Other (See Comments)    Causes anger  . Imdur [Isosorbide Dinitrate] Other (See Comments)    Severe headache (trying lower dose 07/05/18)  . Lipitor [Atorvastatin Calcium]     cramps  . Statins Other (See Comments)    Muscle aches and cramps  . Tramadol Other (See Comments)    Chest pain   . Tricor [Fenofibrate] Other (See Comments)    Leg cramps  . Valproic Acid Other (See Comments)    "anger"     PHYSICAL EXAM:  ECOG Performance status: 1  Vitals:   01/23/19 1032  BP: 118/61  Pulse: (!) 59  Resp: 14  Temp: 97.8 F (36.6 C)  SpO2: 100%   Filed Weights   01/23/19 1032  Weight: 224 lb 8 oz (101.8 kg)    Physical Exam Constitutional:      Appearance: Normal appearance.  Cardiovascular:     Rate and Rhythm: Normal rate and  regular rhythm.  Pulmonary:     Effort: Pulmonary effort is normal.     Breath sounds: Normal breath sounds.  Abdominal:     General: Bowel sounds are normal. There is no distension.     Palpations: Abdomen is soft.  Musculoskeletal:        General: No swelling.  Skin:    General: Skin is warm.  Neurological:     General: No focal deficit present.     Mental Status: He is alert and oriented to person, place, and time.  Psychiatric:        Mood and Affect: Mood normal.        Behavior: Behavior normal.      LABORATORY DATA:  I have reviewed the labs as listed.  CBC    Component Value Date/Time   WBC 4.7 11/30/2018 0604   RBC 3.57 (L) 11/30/2018 0604   HGB 11.1 (L) 11/30/2018 0604   HCT 33.5 (L) 11/30/2018 0604   PLT 132 (L) 11/30/2018 0604   MCV 93.8 11/30/2018 0604   MCH 31.1 11/30/2018 0604   MCHC 33.1 11/30/2018 0604   RDW 13.2 11/30/2018 0604   LYMPHSABS 0.6 (L) 11/30/2018 0604   MONOABS 0.4 11/30/2018 0604   EOSABS 0.0 11/30/2018 0604   BASOSABS 0.0 11/30/2018 0604   CMP Latest Ref Rng & Units 11/30/2018 11/29/2018 11/28/2018  Glucose 70 - 99 mg/dL 219(H) 259(H) 105(H)  BUN 6 - 20 mg/dL 22(H) 16 25(H)  Creatinine 0.61 - 1.24 mg/dL 1.13 1.02 1.33(H)  Sodium 135 - 145 mmol/L 137 136 136  Potassium 3.5 - 5.1 mmol/L 3.7 4.3 3.4(L)  Chloride 98 - 111 mmol/L 104 103 101  CO2 22 - 32 mmol/L 26 24 24   Calcium 8.9 - 10.3 mg/dL 8.1(L) 8.2(L) 8.2(L)  Total Protein 6.5 - 8.1 g/dL 6.3(L) 6.4(L) 6.8  Total Bilirubin 0.3 - 1.2 mg/dL 0.5 0.5 1.1  Alkaline Phos 38 - 126 U/L 46 42 47  AST 15 - 41 U/L 35 40 23  ALT 0 - 44 U/L 29 27 22        DIAGNOSTIC IMAGING:  I have independently reviewed the scans and discussed with the patient.   I have reviewed Venita Lick LPN's note and agree with the documentation.  I personally performed a face-to-face visit, made revisions and my assessment and plan is as follows.    ASSESSMENT & PLAN:   Thrombocytopenia ([place]) 1.   Thrombocytopenia: -He has mild thrombocytopenia since 2009.  Last platelet count was 132. -Ultrasound of the abdomen on 11/22/2018 shows borderline splenomegaly, 12.3 cm, but elevated volume at 517 mL. -CT scan of the abdomen and pelvis on 11/23/2018 shows hepatic cirrhosis.  Spleen size is normal without focal abnormality.  Hepatitis panel was negative. -Nutritional work-up including serum copper, folic acid and R11 were within normal limits.  SPEP was negative.  H. pylori antibodies were negative. -ANA was negative.  However rheumatoid factor was strongly positive at 68.9. -Patient does report arthritis of small joints of hands and feet.  He also has hip pains.  I have recommended rheumatology consultation. -I will see him back in 3 months for follow-up with repeat CBC.  2.  Normocytic anemia: -He was found to have normocytic anemia during recent hospital admission.  This is likely postsurgical. -Iron levels were borderline.  He was recommended to start iron therapy with stool softener.  He reportedly dropped off stool sample for  occult blood testing.  However I am not able to find results in the EMR. -He was referred to Dr. Laural Golden.  Last colonoscopy was more than 5 years ago done in [facility].   3.  Vitamin D deficiency: - Vitamin D level was severely low at 13.2. -We will start him on vitamin D 50,000 units weekly.  I plan to recheck it at next visit.  4.  Cirrhosis: -Cholecystectomy and liver biopsy on 11/28/2018 showed cirrhosis. - He will follow-up with Dr.Rehman.  Total time spent is 25 minutes with more than 50% of the time spent face-to-face discussing lab results, further plan of action and coordination of care.    Orders placed this encounter:  Orders Placed This Encounter  Procedures  . CBC with Differential/Platelet  . Comprehensive metabolic panel  . Vitamin D 25 hydroxy  . Vitamin B12      Derek Jack, [place] [phone]

## 2019-01-23 NOTE — Patient Instructions (Addendum)
[place] at [facility] Discharge Instructions  You were seen today by Dr. Delton Coombes. He went over your recent results, your rheumatoid factor was positive. We will send in a Prescription for vitamin D that you will need to take weekly. He will see you back in 3 months for labs and follow up.   Thank you for choosing [place] at [facility] to provide your oncology and hematology care.  To afford each patient quality time with our provider, please arrive at least 15 minutes before your scheduled appointment time.   If you have a lab appointment with the [facility] please come in thru the  Main Entrance and check in at the main information desk  You need to re-schedule your appointment should you arrive 10 or more minutes late.  We strive to give you quality time with our providers, and arriving late affects you and other patients whose appointments are after yours.  Also, if you no show three or more times for appointments you may be dismissed from the clinic at the providers discretion.     Again, thank you for choosing [facility].  Our hope is that these requests will decrease the amount of time that you wait before being seen by our physicians.       _____________________________________________________________  Should you have questions after your visit to [facility], please contact our office at [phone] between the hours of 8:00 a.m. and 4:30 p.m.  Voicemails left after 4:00 p.m. will not be returned until the following business day.  For prescription refill requests, have your pharmacy contact our office and allow 72 hours.    Cancer Center Support Programs:   > Cancer Support Group  2nd Tuesday of the month 1pm-2pm, Journey Room

## 2019-01-23 NOTE — Assessment & Plan Note (Signed)
1.  Thrombocytopenia: -He has mild thrombocytopenia since 2009.  Last platelet count was 132. -Ultrasound of the abdomen on 11/22/2018 shows borderline splenomegaly, 12.3 cm, but elevated volume at 517 mL. -CT scan of the abdomen and pelvis on 11/23/2018 shows hepatic cirrhosis.  Spleen size is normal without focal abnormality.  Hepatitis panel was negative. -Nutritional work-up including serum copper, folic acid and O71 were within normal limits.  SPEP was negative.  H. pylori antibodies were negative. -ANA was negative.  However rheumatoid factor was strongly positive at 68.9. -Patient does report arthritis of small joints of hands and feet.  He also has hip pains.  I have recommended rheumatology consultation. -I will see him back in 3 months for follow-up with repeat CBC.  2.  Normocytic anemia: -He was found to have normocytic anemia during recent hospital admission.  This is likely postsurgical. -Iron levels were borderline.  He was recommended to start iron therapy with stool softener.  He reportedly dropped off stool sample for occult blood testing.  However I am not able to find results in the EMR. -He was referred to Dr. Laural Golden.  Last colonoscopy was more than 5 years ago done in [facility].   3.  Vitamin D deficiency: - Vitamin D level was severely low at 13.2. -We will start him on vitamin D 50,000 units weekly.  I plan to recheck it at next visit.  4.  Cirrhosis: -Cholecystectomy and liver biopsy on 11/28/2018 showed cirrhosis. - He will follow-up with Dr.Rehman.

## 2019-03-09 ENCOUNTER — Ambulatory Visit (INDEPENDENT_AMBULATORY_CARE_PROVIDER_SITE_OTHER): Payer: Medicare Other | Admitting: Internal Medicine

## 2019-03-13 ENCOUNTER — Ambulatory Visit (INDEPENDENT_AMBULATORY_CARE_PROVIDER_SITE_OTHER): Payer: Medicare Other | Admitting: Internal Medicine

## 2019-03-13 ENCOUNTER — Other Ambulatory Visit: Payer: Self-pay

## 2019-03-13 ENCOUNTER — Encounter (INDEPENDENT_AMBULATORY_CARE_PROVIDER_SITE_OTHER): Payer: Self-pay | Admitting: Internal Medicine

## 2019-03-13 DIAGNOSIS — K7469 Other cirrhosis of liver: Secondary | ICD-10-CM

## 2019-03-13 DIAGNOSIS — I200 Unstable angina: Secondary | ICD-10-CM | POA: Diagnosis not present

## 2019-03-13 NOTE — Progress Notes (Signed)
Subjective:    Patient ID: [name] [name], male    DOB: 03/15/1956, 63 y.o.   MRN: [id number] PCP Dr. Kerin Perna.  HPI Start time 9:00am Ended 9:10am. Total time 10 minutes.  Patient consents to this telephone OV. He is at home. I am in the [place]. OV due to risk of COVID-19. Unable to do video OV.  Last seen 12/02/2018. New diagnosis of Cirrhosis.  No family hx of cirrhosis.  No hx of etoh or drugs.  Told over 5 yrs ago (per patient) he had a fatty liver. Auto immune process ruled out. States his weight today is 223. Last weight 230. He has lost 7 pounds. He is trying to diet. He works part time. His appetite is good. BMs are moving Blood sugars have been running around 150-200 at night.  In the morning his BS are around 100.  11/26/2018 HA1C 7.2  Underwent a laparoscopic cholecystectomy on 11/28/2018 and liver biopsy was performed by Dr. Constance Haw. 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  Noted also that his platelets are low at 132.  Liver enzymes were  normal.    12/02/2018  HCV AB less than 01 AFP 1.5, sedrate 89, ceruloplasmin 35, A-1 antitrypsin 267,  ANA negative, IgG 1,357 (normal). Ferritin 228,  Platelet ct 132 PT/INR 11.7 and 1.1.  CBC    Component Value Date/Time   WBC 4.7 11/30/2018 0604   RBC 3.57 (L) 11/30/2018 0604   HGB 11.1 (L) 11/30/2018 0604   HCT 33.5 (L) 11/30/2018 0604   PLT 132 (L) 11/30/2018 0604   MCV 93.8 11/30/2018 0604   MCH 31.1 11/30/2018 0604   MCHC 33.1 11/30/2018 0604   RDW 13.2 11/30/2018 0604   LYMPHSABS 0.6 (L) 11/30/2018 0604   MONOABS 0.4 11/30/2018 0604   EOSABS 0.0 11/30/2018 0604   BASOSABS 0.0 11/30/2018 0604     Review of Systems Past Medical History:  Diagnosis Date  . Anxiety   . Arthritis   . Asthma   . Cataract    Right eye  . Chronic lower back pain   . Coronary atherosclerosis of native coronary artery    a. LAD & OM stenting;  b. 2007 Cath: nonobs dzs, patent stents;  c. 07/2012 neg MV, EF 61%.  d. 11/20/14:  [place] s/p  PCI w/ DES to pLAD and DES to OM1   . Essential hypertension, benign   . GERD (gastroesophageal reflux disease)   . Hearing loss of left ear   . History of gout   . History of hiatal hernia   . Hypercholesteremia   . Lumbar herniated disc   . MI, old   . Migraine   . OSA on CPAP   . 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 Thrombocytopenia ([place])   . TIA (transient ischemic attack) ~ 2010  . Type 2 diabetes mellitus ([place])     Past Surgical History:  Procedure Laterality Date  . ANTERIOR CERVICAL DECOMP/DISCECTOMY FUSION  1998   "C3-4"  . CARDIAC CATHETERIZATION  "several"  . CARDIAC CATHETERIZATION N/A 12/30/2015   Procedure: Left Heart Cath and Coronary Angiography;  Surgeon: [name], MD;  Location: [place] [facility];  Service: Cardiovascular;  Laterality: N/A;  . CARPAL TUNNEL RELEASE Bilateral 2005  . CHOLECYSTECTOMY N/A 11/28/2018   Procedure: LAPAROSCOPIC CHOLECYSTECTOMY;  Surgeon: [name], MD;  Location: [facility];  Service: General;  Laterality: N/A;  . COLONOSCOPY    . CORONARY ANGIOPLASTY    . CORONARY ANGIOPLASTY WITH STENT PLACEMENT  2002; 2003; 11/20/2014   "I have 4 stents after today" (11/20/2014)  . CORONARY ARTERY BYPASS GRAFT N/A 01/09/2016   Procedure: CORONARY ARTERY BYPASS GRAFTING (CABG) X 3 UTILIZING RIGHT AND LEFT INTERNAL MAMMARY ARTERY AND ENDOSCOPICALLY HARVESTED SAPHENEOUS VEIN.;  Surgeon: Rexene Alberts, MD;  Location: [place];  Service: Open Heart Surgery;  Laterality: N/A;  . ESOPHAGOGASTRODUODENOSCOPY    . KNEE SURGERY Left 02/2012   "scraped; open"  . LEFT HEART CATH AND CORS/GRAFTS ANGIOGRAPHY N/A 08/09/2017   Procedure: LEFT HEART CATH AND CORS/GRAFTS ANGIOGRAPHY;  Surgeon: [name], MD;  Location: [place] [facility];  Service: Cardiovascular;  Laterality: N/A;  . LEFT HEART CATH AND CORS/GRAFTS ANGIOGRAPHY N/A 11/22/2018   Procedure: LEFT HEART CATH AND  CORS/GRAFTS ANGIOGRAPHY;  Surgeon: [name], [name], MD;  Location: [place] [facility];  Service: Cardiovascular;  Laterality: N/A;  . LEFT HEART CATHETERIZATION WITH CORONARY ANGIOGRAM N/A 07/20/2012   Procedure: LEFT HEART CATHETERIZATION WITH CORONARY ANGIOGRAM;  Surgeon: [name], MD;  Location: [place] [facility];  Service: Cardiovascular;  Laterality: N/A;  . LEFT HEART CATHETERIZATION WITH CORONARY ANGIOGRAM N/A 11/20/2014   Procedure: LEFT HEART CATHETERIZATION WITH CORONARY ANGIOGRAM;  Surgeon: [name], MD;  Location: [facility] CATH LAB;  Service: Cardiovascular;  Laterality: N/A;  . LEFT HEART CATHETERIZATION WITH CORONARY ANGIOGRAM N/A 11/26/2014   Procedure: LEFT HEART CATHETERIZATION WITH CORONARY ANGIOGRAM;  Surgeon: [name], MD;  Location: [facility] CATH LAB;  Service: Cardiovascular;  Laterality: N/A;  . NEUROPLASTY / TRANSPOSITION ULNAR NERVE AT ELBOW Right ~ 2012  . PERCUTANEOUS CORONARY ROTOBLATOR INTERVENTION (PCI-R)  11/20/2014   Procedure: PERCUTANEOUS CORONARY ROTOBLATOR INTERVENTION (PCI-R);  Surgeon: [name], MD;  Location: [facility] CATH LAB;  Service: Cardiovascular;;  . SHOULDER ARTHROSCOPY Left ~ 2011  . TEE WITHOUT CARDIOVERSION N/A 01/09/2016   Procedure: TRANSESOPHAGEAL ECHOCARDIOGRAM (TEE);  Surgeon: Rexene Alberts, MD;  Location: [place];  Service: Open Heart Surgery;  Laterality: N/A;    Allergies  Allergen Reactions  . Divalproex Sodium Other (See Comments)    Causes anger  . Imdur [Isosorbide Dinitrate] Other (See Comments)    Severe headache (trying lower dose 07/05/18)  . Lipitor [Atorvastatin Calcium]     cramps  . Statins Other (See Comments)    Muscle aches and cramps  . Tramadol Other (See Comments)    Chest pain   . Tricor [Fenofibrate] Other (See Comments)    Leg cramps  . Valproic Acid Other (See Comments)    "anger"    Current Outpatient Medications on File Prior to Visit  Medication Sig Dispense Refill  . albuterol (PROVENTIL HFA;VENTOLIN  HFA) 108 (90 BASE) MCG/ACT inhaler Inhale 2 puffs into the lungs every 6 (six) hours as needed for wheezing or shortness of breath.    [name] amLODipine (NORVASC) 5 MG tablet Take 5 mg by mouth at bedtime.  4  . aspirin EC 81 MG tablet Take 81 mg by mouth at bedtime.     . cetirizine (ZYRTEC) 10 MG tablet Take 10 mg by mouth at bedtime.    . citalopram (CELEXA) 20 MG tablet Take 20 mg by mouth at bedtime.     . ergocalciferol (VITAMIN D2) 1.25 MG (50000 UT) capsule Take 1 capsule (50,000 Units total) by mouth once a week. 12 capsule 1  . ezetimibe (ZETIA) 10 MG  tablet Take 10 mg by mouth at bedtime.    . fluticasone (FLONASE) 50 MCG/ACT nasal spray Place 1 spray into the nose 2 (two) times daily.     . Fluticasone-Salmeterol (ADVAIR) 250-50 MCG/DOSE AEPB Inhale 1 puff into the lungs every 12 (twelve) hours.    . gabapentin (NEURONTIN) 300 MG capsule Take 300 mg by mouth 3 (three) times daily.  2  . insulin aspart (NOVOLOG FLEXPEN) 100 UNIT/ML FlexPen Inject 2-30 Units into the skin 3 (three) times daily as needed for high blood sugar (CBG >150). Per sliding scale written down at home     . isosorbide mononitrate (IMDUR) 30 MG 24 hr tablet Take 30 mg by mouth at bedtime.  4  . Melatonin 5 MG TABS Take 10 mg by mouth at bedtime.     . metFORMIN (GLUCOPHAGE) 1000 MG tablet Take 1 tablet (1,000 mg total) by mouth 2 (two) times daily. For the next 2 days as you did receive IV contrast, then resume on 11/26/2018 60 tablet 11  . nitroGLYCERIN (NITROSTAT) 0.4 MG SL tablet Place 1 tablet (0.4 mg total) under the tongue every 5 (five) minutes as needed for chest pain. (Patient not taking: Reported on 12/26/2018) 25 tablet 3  . omeprazole (PRILOSEC) 20 MG capsule Take 20 mg by mouth 2 (two) times daily before a meal.    . pravastatin (PRAVACHOL) 20 MG tablet Take 1 tablet (20 mg total) by mouth daily. 10 tablet 0  . topiramate (TOPAMAX) 25 MG capsule Take 25 mg by mouth 2 (two) times daily.    . TRESIBA FLEXTOUCH  100 UNIT/ML SOPN FlexTouch Pen Inject 60-70 Units into the skin See admin instructions. 60 units in the morning and 70 units at bedtime  5   No current facility-administered medications on file prior to visit.         Objective:   Physical Exam  Deferred.         Assessment & Plan:  Non-alcoholic cirrhosis. Am going to get a sedrate, Acute Hepatitis panel, Iron, and TIBC, hepatic. OV in 6 months.

## 2019-03-13 NOTE — Patient Instructions (Signed)
Labs today.  OV in 6 months.

## 2019-03-30 NOTE — Progress Notes (Signed)
Office Visit Note  Patient: [name] [name]             Date of Birth: 20-Apr-1956           MRN: [id number]             PCP: Terrial Rhodes, MD Referring: Derek Jack, MD Visit Date: 04/13/2019 Occupation: Disability, medical leave , Part-time cashier at [employer]  Subjective:  Pain in joints and positive rheumatoid factor.   History of Present Illness: [name] [name] is a 63 y.o. male seen in consultation per his hematologist.  According to patient he has had joint pain for many years.  He states in January 2020 he went to the hospital for cholecystectomy at the time he was found to have pancytopenia.  He was referred to hematology where he had further labs and his rheumatoid factor came positive.  He states he has follow-up appointment with hematologist regarding his pancytopenia.  He has been experiencing pain in his hands, hip joints, knee joints, feet for many years.  He has not noticed any joint swelling.  He states he has occasional joint swelling in his ankles without any discomfort.  He has history of degenerative disc disease of cervical spine and had cervical spine fusion in 1998.  There is no family history of autoimmune disease.  Activities of Daily Living:  Patient reports morning stiffness for 30 minutes.   Patient Reports nocturnal pain.  Difficulty dressing/grooming: Denies Difficulty climbing stairs: Reports Difficulty getting out of chair: Reports Difficulty using hands for taps, buttons, cutlery, and/or writing: Denies  Review of Systems  Constitutional: Positive for fatigue. Negative for night sweats.  HENT: Negative for mouth sores, mouth dryness and nose dryness.   Eyes: Positive for dryness. Negative for redness.  Respiratory: Negative for shortness of breath and difficulty breathing.   Cardiovascular: Negative for chest pain, palpitations, hypertension, irregular heartbeat and swelling in legs/feet.  Gastrointestinal: Negative for constipation and  diarrhea.  Endocrine: Negative for increased urination.  Musculoskeletal: Positive for arthralgias, joint pain and morning stiffness. Negative for joint swelling, myalgias, muscle weakness, muscle tenderness and myalgias.  Skin: Positive for sensitivity to sunlight. Negative for color change, rash, hair loss, nodules/bumps, skin tightness and ulcers.  Allergic/Immunologic: Negative for susceptible to infections.  Neurological: Negative for dizziness, fainting, memory loss, night sweats and weakness ( ).  Hematological: Negative for swollen glands.  Psychiatric/Behavioral: Negative for depressed mood and sleep disturbance. The patient is nervous/anxious.     PMFS History:  Patient Active Problem List   Diagnosis Date Noted  . DDD (degenerative disc disease), cervical 04/13/2019  . RUQ abdominal pain 11/28/2018  . Calculus of gallbladder with acute cholecystitis without obstruction   . Fatty liver   . Acute diastolic CHF (congestive heart failure) ([place]) 11/26/2018  . Right upper quadrant abdominal pain 11/26/2018  . Acute respiratory failure with hypoxia ([place]) 11/26/2018  . NASH (nonalcoholic steatohepatitis)   . Pancytopenia, acquired ([place])   . Acute kidney injury ([place]) 11/20/2018  . Aphasia 07/09/2018  . Cerebral thrombosis with cerebral infarction 07/09/2018  . Chest pain 08/10/2017  . 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 Coronary artery disease involving native coronary artery with unstable angina pectoris ([place])   . CAD -S/P PCI LAD OM/LAD '13 and 2016 12/31/2015  . Hyperlipidemia   . Chest pain with high risk of acute coronary syndrome 12/23/2015  . Precordial chest pain 12/03/2014  . TIA (transient ischemic attack)   . CAD- diffuse LAD  disease at cath 12/30/15   . Thrombocytopenia ([place])   . Fever 12/04/2013  . Accelerating angina ([place]) 12/03/2013  . Type 2 diabetes mellitus ([place])   . Anxiety   . PVC's (premature ventricular contractions) 01/06/2013  . Obstructive sleep apnea-declines  C-pap 07/04/2012  . Mixed hyperlipidemia 07/30/2009  . Essential hypertension, benign 07/30/2009    Past Medical History:  Diagnosis Date  . Anxiety   . Arthritis   . Asthma   . Cataract    Right eye  . Chronic lower back pain   . Coronary atherosclerosis of native coronary artery    a. LAD & OM stenting;  b. 2007 Cath: nonobs dzs, patent stents;  c. 07/2012 neg MV, EF 61%.  d. 11/20/14: [place] s/p  PCI w/ DES to pLAD and DES to OM1   . Essential hypertension, benign   . GERD (gastroesophageal reflux disease)   . Hearing loss of left ear   . History of gout   . History of hiatal hernia   . Hypercholesteremia   . Lumbar herniated disc   . MI, old   . Migraine   . OSA on CPAP   . 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 Thrombocytopenia ([place])   . TIA (transient ischemic attack) ~ 2010  . Type 2 diabetes mellitus ([facility])     Family History  Problem Relation Age of Onset  . Stroke Mother   . Coronary artery disease Father 9  . Asthma Sister   . Multiple sclerosis Brother   . Heart disease Paternal Uncle   . Stroke Maternal Grandmother   . Heart attack Paternal Grandmother   . Cancer Paternal Grandfather   . Asthma Sister   . Seizures Son   . Migraines Son   . Autism Son   . Migraines Son    Past Surgical History:  Procedure Laterality Date  . ANTERIOR CERVICAL DECOMP/DISCECTOMY FUSION  1998   "C3-4"  . CARDIAC CATHETERIZATION  "several"  . CARDIAC CATHETERIZATION N/A 12/30/2015   Procedure: Left Heart Cath and Coronary Angiography;  Surgeon: [name], MD;  Location: [place] [facility];  Service: Cardiovascular;  Laterality: N/A;  . CARPAL TUNNEL RELEASE Bilateral 2005  . CHOLECYSTECTOMY N/A 11/28/2018   Procedure: LAPAROSCOPIC CHOLECYSTECTOMY;  Surgeon: [name], MD;  Location: [facility];  Service: General;  Laterality: N/A;  . COLONOSCOPY    . CORONARY ANGIOPLASTY    . CORONARY ANGIOPLASTY  WITH STENT PLACEMENT  2002; 2003; 11/20/2014   "I have 4 stents after today" (11/20/2014)  . CORONARY ARTERY BYPASS GRAFT N/A 01/09/2016   Procedure: CORONARY ARTERY BYPASS GRAFTING (CABG) X 3 UTILIZING RIGHT AND LEFT INTERNAL MAMMARY ARTERY AND ENDOSCOPICALLY HARVESTED SAPHENEOUS VEIN.;  Surgeon: Rexene Alberts, MD;  Location: [place];  Service: Open Heart Surgery;  Laterality: N/A;  . ESOPHAGOGASTRODUODENOSCOPY    . KNEE SURGERY Left 02/2012   "scraped; open"  . LEFT HEART CATH AND CORS/GRAFTS ANGIOGRAPHY N/A 08/09/2017   Procedure: LEFT HEART CATH AND CORS/GRAFTS ANGIOGRAPHY;  Surgeon: [name], MD;  Location: [place] [facility];  Service: Cardiovascular;  Laterality: N/A;  . LEFT HEART CATH AND CORS/GRAFTS ANGIOGRAPHY N/A 11/22/2018   Procedure: LEFT HEART CATH AND CORS/GRAFTS ANGIOGRAPHY;  Surgeon: [name], [name], MD;  Location: [place] [facility];  Service: Cardiovascular;  Laterality: N/A;  . LEFT HEART CATHETERIZATION WITH CORONARY ANGIOGRAM N/A  07/20/2012   Procedure: LEFT HEART CATHETERIZATION WITH CORONARY ANGIOGRAM;  Surgeon: [name], MD;  Location: [place] C[facility] LAB;  Service: Cardiovascular;  Laterality: N/A;  . LEFT HEART CATHETERIZATION WITH CORONARY ANGIOGRAM N/A 11/20/2014   Procedure: LEFT HEART CATHETERIZATION WITH CORONARY ANGIOGRAM;  Surgeon: [name], MD;  Location: [facility] CATH LAB;  Service: Cardiovascular;  Laterality: N/A;  . LEFT HEART CATHETERIZATION WITH CORONARY ANGIOGRAM N/A 11/26/2014   Procedure: LEFT HEART CATHETERIZATION WITH CORONARY ANGIOGRAM;  Surgeon: [name], MD;  Location: [facility] CATH LAB;  Service: Cardiovascular;  Laterality: N/A;  . NEUROPLASTY / TRANSPOSITION ULNAR NERVE AT ELBOW Right ~ 2012  . PERCUTANEOUS CORONARY ROTOBLATOR INTERVENTION (PCI-R)  11/20/2014   Procedure: PERCUTANEOUS CORONARY ROTOBLATOR INTERVENTION (PCI-R);  Surgeon: [name], MD;  Location: [facility] CATH LAB;  Service: Cardiovascular;;  . SHOULDER ARTHROSCOPY Left ~ 2011  .  TEE WITHOUT CARDIOVERSION N/A 01/09/2016   Procedure: TRANSESOPHAGEAL ECHOCARDIOGRAM (TEE);  Surgeon: Rexene Alberts, MD;  Location: [place];  Service: Open Heart Surgery;  Laterality: N/A;   Social History   Social History Narrative   Patient lives at home with wife [name].   Patient works at [employer], Engineer, petroleum.   Patient has a 12 th grade education.    Patient has 3 children.       Immunization History  Administered Date(s) Administered  . Influenza Split 07/21/2012  . Influenza,inj,Quad PF,6+ Mos 12/05/2013, 08/11/2017, 11/21/2018     Objective: Vital Signs: BP 128/75 (BP Location: Right Arm, Patient Position: Sitting, Cuff Size: Normal)   Pulse 63   Resp 13   Ht 5' 9"  (1.753 m)   Wt 230 lb 6.4 oz (104.5 kg)   BMI 34.02 kg/m    Physical Exam Vitals signs and nursing note reviewed.  Constitutional:      Appearance: He is well-developed.  HENT:     Head: Normocephalic and atraumatic.  Eyes:     Conjunctiva/sclera: Conjunctivae normal.     Pupils: Pupils are equal, round, and reactive to light.  Neck:     Musculoskeletal: Normal range of motion and neck supple.  Cardiovascular:     Rate and Rhythm: Normal rate and regular rhythm.     Heart sounds: Normal heart sounds.  Pulmonary:     Effort: Pulmonary effort is normal.     Breath sounds: Normal breath sounds.  Abdominal:     General: Bowel sounds are normal.     Palpations: Abdomen is soft.  Skin:    General: Skin is warm and dry.     Capillary Refill: Capillary refill takes less than 2 seconds.  Neurological:     Mental Status: He is alert and oriented to person, place, and time.  Psychiatric:        Behavior: Behavior normal.      Musculoskeletal Exam: He has limited range of motion of his cervical spine.  Some discomfort range of motion of his shoulder joints.  Elbow joints with good range of motion with no synovitis.  Wrist joints with good range of motion with no synovitis.  He had no  tenderness or swelling over his MCPs.  He has DIP and PIP thickening bilaterally without any synovitis consistent with osteoarthritis.  Hip joints were in good range of motion.  He has some tenderness over trochanteric bursa.  He had discomfort range of motion of bilateral knee joints without any warmth swelling or effusion.  No swelling was noted over ankle joints.  He  has MTP and PIP thickening but no synovitis was noted.  CDAI Exam: CDAI Score: Not documented Patient Global Assessment: Not documented; Provider Global Assessment: Not documented Swollen: Not documented; Tender: Not documented Joint Exam   Not documented   There is currently no information documented on the homunculus. Go to the Rheumatology activity and complete the homunculus joint exam.  Investigation: No additional findings. Component     Latest Ref Rng & Units 12/26/2018  Total Protein ELP     6.0 - 8.5 g/dL 7.8  Albumin ELP     2.9 - 4.4 g/dL 3.1  Alpha-1-Globulin     0.0 - 0.4 g/dL 0.3  Alpha-2-Globulin     0.4 - 1.0 g/dL 1.0  Beta Globulin     0.7 - 1.3 g/dL 1.6 (H)  Gamma Globulin     0.4 - 1.8 g/dL 1.8  M-SPIKE, %     Not Observed g/dL Not Observed  SPE Interp.      Comment  Comment      Comment  Globulin, Total     2.2 - 3.9 g/dL 4.7 (H)  A/G Ratio     0.7 - 1.7 0.7  H. Pylogi, Iga Abs     0.0 - 8.9 units <9.0  H. Pylogi, Igm Abs     0.0 - 8.9 units <9.0  H. pylori, IgG Abs     0.00 - 0.79 Index Value 0.15  Vitamin D, 25-Hydroxy     30.0 - 100.0 ng/mL 13.2 (L)  RA Latex Turbid.     0.0 - 13.9 IU/mL 68.9 (H)  ANA Ab, IFA      Negative  Vitamin B12     180 - 914 pg/mL 314   Imaging: Dg Chest 2 View  Result Date: 04/06/2019 CLINICAL DATA:  Acute onset of LEFT-sided chest pain radiating to the LEFT shoulder and LEFT neck that began at approximately 2 o'clock p.m. this afternoon. Personal history of MI, CABG and coronary artery stenting. EXAM: CHEST - 2 VIEW COMPARISON:  11/25/2018 and  earlier, including CTA chest 11/24/2018. FINDINGS: Sternotomy for CABG. Cardiac silhouette upper normal in size, unchanged. Thoracic aorta mildly atherosclerotic, unchanged. Hilar and mediastinal contours otherwise unremarkable. Lungs clear. Bronchovascular markings normal. Pulmonary vascularity normal. No visible pleural effusions. No pneumothorax. Degenerative changes and DISH involving the thoracic spine. IMPRESSION: No acute cardiopulmonary disease. Electronically Signed   By: Evangeline Dakin M.D.   On: 04/06/2019 16:47    Recent Labs: Lab Results  Component Value Date   WBC 2.8 (L) 04/06/2019   HGB 14.1 04/06/2019   PLT 79 (L) 04/06/2019   NA 136 04/06/2019   K 4.2 04/06/2019   CL 106 04/06/2019   CO2 21 (L) 04/06/2019   GLUCOSE 260 (H) 04/06/2019   BUN 19 04/06/2019   CREATININE 1.07 04/06/2019   BILITOT 0.5 11/30/2018   ALKPHOS 46 11/30/2018   AST 35 11/30/2018   ALT 29 11/30/2018   PROT 6.3 (L) 11/30/2018   ALBUMIN 2.4 (L) 11/30/2018   CALCIUM 9.1 04/06/2019   GFRAA >60 04/06/2019    Speciality Comments: No specialty comments available.  Procedures:  No procedures performed Allergies: Divalproex sodium; Imdur [isosorbide dinitrate]; Lipitor [atorvastatin calcium]; Statins; Tramadol; Tricor [fenofibrate]; and Valproic acid   Assessment / Plan:     Visit Diagnoses: Rheumatoid factor positive - 12/26/18: RF 68.9, ANA negative, vitamin D 13.2, vitamin B12 314.  We had detailed discussion regarding positive rheumatoid factor is association with autoimmune disease.  At this  point he does not have any evidence of inflammatory arthritis.  Although I made him aware that rheumatoid factor can make him prone towards rheumatoid arthritis and in case he develops any symptoms in future he should notify me.  Pain in both hands -he had no synovitis in his hands and clinical findings are consistent with osteoarthritis with DIP and PIP thickening.  No MCP or wrist joint involvement was  noted.  A handout on hand exercises was given.  Plan: XR Hand 2 View Right, XR Hand 2 View Left.  X-ray findings were consistent with osteoarthritis of the hand.  Chronic pain of both knees -he has chronic discomfort in his knee joints.  No warmth swelling or effusion was noted.  A handout on knee exercises was given.  Weight loss diet and exercise was also emphasized.  Plan: XR KNEE 3 VIEW RIGHT, XR KNEE 3 VIEW LEFT.  X-rays were consistent with mild to moderate osteoarthritis and mild chondromalacia patella.  Pain in both feet -clinical findings are consistent with osteoarthritis in his feet.  Plan: XR Foot 2 Views Right, XR Foot 2 Views Left.  X-rays were consistent with osteoarthritis of the feet.  DDD (degenerative disc disease), cervical - s/p fusion 1998.  He has limited range of motion of his cervical spine.  Other medical problems are listed as follows:  Cerebral thrombosis with cerebral infarction  CAD -S/P PCI LAD OM/LAD '13 and 2016  Essential hypertension, benign  History of type 2 diabetes mellitus  TIA (transient ischemic attack)  PVC's (premature ventricular contractions)  Obstructive sleep apnea-declines C-pap  Fatty liver  Pancytopenia, acquired (HCC)-he has been followed by hematology currently.  Mixed hyperlipidemia  History of anxiety   Orders: Orders Placed This Encounter  Procedures  . XR Hand 2 View Right  . XR Hand 2 View Left  . XR KNEE 3 VIEW RIGHT  . XR KNEE 3 VIEW LEFT  . XR Foot 2 Views Right  . XR Foot 2 Views Left   No orders of the defined types were placed in this encounter.   Face-to-face time spent with patient was 55 minutes. Greater than 50% of time was spent in counseling and coordination of care.  Follow-Up Instructions: Return if symptoms worsen or fail to improve, for Osteoarthritis, positive rheumatoid factor.   Bo Merino, MD  Note - This record has been created using Editor, commissioning.  Chart creation errors have  been sought, but may not always  have been located. Such creation errors do not reflect on  the standard of medical care.

## 2019-04-06 ENCOUNTER — Encounter (HOSPITAL_COMMUNITY): Payer: Self-pay | Admitting: Emergency Medicine

## 2019-04-06 ENCOUNTER — Emergency Department (HOSPITAL_COMMUNITY): Payer: Medicare Other

## 2019-04-06 ENCOUNTER — Other Ambulatory Visit: Payer: Self-pay

## 2019-04-06 ENCOUNTER — Emergency Department (HOSPITAL_COMMUNITY)
Admission: EM | Admit: 2019-04-06 | Discharge: 2019-04-06 | Disposition: A | Discharge status: Home / Self Care | Payer: Medicare Other | Attending: Emergency Medicine | Admitting: Emergency Medicine

## 2019-04-06 DIAGNOSIS — Z794 Long term (current) use of insulin: Secondary | ICD-10-CM | POA: Insufficient documentation

## 2019-04-06 DIAGNOSIS — Z79899 Other long term (current) drug therapy: Secondary | ICD-10-CM | POA: Diagnosis not present

## 2019-04-06 DIAGNOSIS — I25119 Atherosclerotic heart disease of native coronary artery with unspecified angina pectoris: Secondary | ICD-10-CM | POA: Diagnosis not present

## 2019-04-06 DIAGNOSIS — I10 Essential (primary) hypertension: Secondary | ICD-10-CM | POA: Diagnosis not present

## 2019-04-06 DIAGNOSIS — Z8673 Personal history of transient ischemic attack (TIA), and cerebral infarction without residual deficits: Secondary | ICD-10-CM | POA: Insufficient documentation

## 2019-04-06 DIAGNOSIS — Z951 Presence of aortocoronary bypass graft: Secondary | ICD-10-CM | POA: Diagnosis not present

## 2019-04-06 DIAGNOSIS — R079 Chest pain, unspecified: Secondary | ICD-10-CM | POA: Diagnosis not present

## 2019-04-06 LAB — CBC
HCT: 41.1 % (ref 39.0–52.0)
Hemoglobin: 14.1 g/dL (ref 13.0–17.0)
MCH: 30.1 pg (ref 26.0–34.0)
MCHC: 34.3 g/dL (ref 30.0–36.0)
MCV: 87.6 fL (ref 80.0–100.0)
Platelets: 79 10*3/uL — ABNORMAL LOW (ref 150–400)
RBC: 4.69 MIL/uL (ref 4.22–5.81)
RDW: 14 % (ref 11.5–15.5)
WBC: 2.8 10*3/uL — ABNORMAL LOW (ref 4.0–10.5)
nRBC: 0 % (ref 0.0–0.2)

## 2019-04-06 LAB — TROPONIN I
Troponin I: <0.03 ng/mL (ref <0.03)
Troponin I: <0.03 ng/mL (ref <0.03)

## 2019-04-06 LAB — BASIC METABOLIC PANEL
Anion gap: 9 (ref 5–15)
BUN: 19 mg/dL (ref 6–20)
CO2: 21 mmol/L — ABNORMAL LOW (ref 22–32)
Calcium: 9.1 mg/dL (ref 8.9–10.3)
Chloride: 106 mmol/L (ref 98–111)
Creatinine, Ser: 1.07 mg/dL (ref 0.61–1.24)
GFR calc Af Amer: >60 mL/min (ref >60)
GFR calc non Af Amer: >60 mL/min (ref >60)
Glucose, Bld: 260 mg/dL — ABNORMAL HIGH (ref 70–99)
Potassium: 4.2 mmol/L (ref 3.5–5.1)
Sodium: 136 mmol/L (ref 135–145)

## 2019-04-06 NOTE — ED Provider Notes (Addendum)
[facility] EMERGENCY DEPARTMENT Provider Note   CSN: [id number] Arrival date & time: 04/06/19  1543    History   Chief Complaint Chief Complaint  Patient presents with  . Chest Pain    HPI ZOE NORDIN is a 63 y.o. male.     Chest pain a brief time ago while getting ready to go into work at [employer].  Associated symptoms dyspnea, diaphoresis, nausea.  Past medical history CAD with stenting x5 and CABG.  He took one aspirin tablet and that seemed to help.  He is feeling better now.  Severity is moderate.     Past Medical History:  Diagnosis Date  . Anxiety   . Arthritis   . Asthma   . Cataract    Right eye  . Chronic lower back pain   . Coronary atherosclerosis of native coronary artery    a. LAD & OM stenting;  b. 2007 Cath: nonobs dzs, patent stents;  c. 07/2012 neg MV, EF 61%.  d. 11/20/14: [place] s/p  PCI w/ DES to pLAD and DES to OM1   . Essential hypertension, benign   . GERD (gastroesophageal reflux disease)   . Hearing loss of left ear   . History of gout   . History of hiatal hernia   . Hypercholesteremia   . Lumbar herniated disc   . MI, old   . Migraine   . OSA on CPAP   . 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 Thrombocytopenia ([place])   . TIA (transient ischemic attack) ~ 2010  . Type 2 diabetes mellitus [facility])     Patient Active Problem List   Diagnosis Date Noted  . RUQ abdominal pain 11/28/2018  . Calculus of gallbladder with acute cholecystitis without obstruction   . Fatty liver   . Acute diastolic CHF (congestive heart failure) ([place]) 11/26/2018  . Right upper quadrant abdominal pain 11/26/2018  . Acute respiratory failure with hypoxia ([place]) 11/26/2018  . NASH (nonalcoholic steatohepatitis)   . Pancytopenia, acquired ([place])   . Acute kidney injury ([place]) 11/20/2018  . Aphasia 07/09/2018  . Cerebral thrombosis with cerebral infarction 07/09/2018  . Chest pain 08/10/2017  . S/P  CABG x 3 01/09/2016  . Coronary artery disease involving native coronary artery with unstable angina pectoris ([place])   . CAD -S/P PCI LAD OM/LAD '13 and 2016 12/31/2015  . Hyperlipidemia   . Chest pain with high risk of acute coronary syndrome 12/23/2015  . Precordial chest pain 12/03/2014  . TIA (transient ischemic attack)   . CAD- diffuse LAD disease at cath 12/30/15   . Thrombocytopenia ([place])   . Fever 12/04/2013  . Accelerating angina ([place]) 12/03/2013  . Type 2 diabetes mellitus ([place])   . Anxiety   . PVC's (premature ventricular contractions) 01/06/2013  . Obstructive sleep apnea-declines C-pap 07/04/2012  . Mixed hyperlipidemia 07/30/2009  . Essential hypertension, benign 07/30/2009    Past Surgical History:  Procedure Laterality Date  . ANTERIOR CERVICAL DECOMP/DISCECTOMY FUSION  1998   "C3-4"  . CARDIAC CATHETERIZATION  "several"  . CARDIAC CATHETERIZATION N/A 12/30/2015   Procedure: Left Heart Cath and Coronary Angiography;  Surgeon: [name], MD;  Location: [place] CV LAB;  Service: Cardiovascular;  Laterality: N/A;  . CARPAL TUNNEL RELEASE Bilateral 2005  . CHOLECYSTECTOMY N/A 11/28/2018   Procedure: LAPAROSCOPIC CHOLECYSTECTOMY;  Surgeon: [name], MD;  Location: [facility];  Service: General;  Laterality: N/A;  . COLONOSCOPY    . CORONARY ANGIOPLASTY    . CORONARY ANGIOPLASTY WITH STENT PLACEMENT  2002; 2003; 11/20/2014   "I have 4 stents after today" (11/20/2014)  . CORONARY ARTERY BYPASS GRAFT N/A 01/09/2016   Procedure: CORONARY ARTERY BYPASS GRAFTING (CABG) X 3 UTILIZING RIGHT AND LEFT INTERNAL MAMMARY ARTERY AND ENDOSCOPICALLY HARVESTED SAPHENEOUS VEIN.;  Surgeon: Rexene Alberts, MD;  Location: [place];  Service: Open Heart Surgery;  Laterality: N/A;  . ESOPHAGOGASTRODUODENOSCOPY    . KNEE SURGERY Left 02/2012   "scraped; open"  . LEFT HEART CATH AND CORS/GRAFTS ANGIOGRAPHY N/A 08/09/2017   Procedure: LEFT HEART CATH AND CORS/GRAFTS ANGIOGRAPHY;   Surgeon: [name], MD;  Location: [place] CV LAB;  Service: Cardiovascular;  Laterality: N/A;  . LEFT HEART CATH AND CORS/GRAFTS ANGIOGRAPHY N/A 11/22/2018   Procedure: LEFT HEART CATH AND CORS/GRAFTS ANGIOGRAPHY;  Surgeon: [name], [name], MD;  Location: [place] CV LAB;  Service: Cardiovascular;  Laterality: N/A;  . LEFT HEART CATHETERIZATION WITH CORONARY ANGIOGRAM N/A 07/20/2012   Procedure: LEFT HEART CATHETERIZATION WITH CORONARY ANGIOGRAM;  Surgeon: [name], MD;  Location: [place] CATH LAB;  Service: Cardiovascular;  Laterality: N/A;  . LEFT HEART CATHETERIZATION WITH CORONARY ANGIOGRAM N/A 11/20/2014   Procedure: LEFT HEART CATHETERIZATION WITH CORONARY ANGIOGRAM;  Surgeon: [name], MD;  Location: [facility] CATH LAB;  Service: Cardiovascular;  Laterality: N/A;  . LEFT HEART CATHETERIZATION WITH CORONARY ANGIOGRAM N/A 11/26/2014   Procedure: LEFT HEART CATHETERIZATION WITH CORONARY ANGIOGRAM;  Surgeon: [name], MD;  Location: [facility] CATH LAB;  Service: Cardiovascular;  Laterality: N/A;  . NEUROPLASTY / TRANSPOSITION ULNAR NERVE AT ELBOW Right ~ 2012  . PERCUTANEOUS CORONARY ROTOBLATOR INTERVENTION (PCI-R)  11/20/2014   Procedure: PERCUTANEOUS CORONARY ROTOBLATOR INTERVENTION (PCI-R);  Surgeon: [name], MD;  Location: [facility] CATH LAB;  Service: Cardiovascular;;  . SHOULDER ARTHROSCOPY Left ~ 2011  . TEE WITHOUT CARDIOVERSION N/A 01/09/2016   Procedure: TRANSESOPHAGEAL ECHOCARDIOGRAM (TEE);  Surgeon: Rexene Alberts, MD;  Location: [place];  Service: Open Heart Surgery;  Laterality: N/A;        Home Medications    Prior to Admission medications   Medication Sig Start Date End Date Taking? Authorizing Provider  albuterol (PROVENTIL HFA;VENTOLIN HFA) 108 (90 BASE) MCG/ACT inhaler Inhale 2 puffs into the lungs every 6 (six) hours as needed for wheezing or shortness of breath.   Yes [provider]  amLODipine (NORVASC) 5 MG tablet Take 5 mg by mouth at bedtime.  06/16/18  Yes [provider]  aspirin EC 81 MG tablet Take 81 mg by mouth at bedtime.    Yes [provider]  citalopram (CELEXA) 20 MG tablet Take 20 mg by mouth at bedtime.    Yes [provider]  ezetimibe (ZETIA) 10 MG tablet Take 10 mg by mouth at bedtime. 07/05/18  Yes [provider]  fluticasone (FLONASE) 50 MCG/ACT nasal spray Place 1 spray into the nose 2 (two) times daily.    Yes [provider]  Fluticasone-Salmeterol (ADVAIR) 250-50 MCG/DOSE AEPB Inhale 1 puff into the lungs every 12 (twelve) hours.   Yes [provider]  gabapentin (NEURONTIN) 300 MG capsule Take 300 mg by mouth 3 (three) times daily. 07/09/17  Yes [provider]  insulin aspart (NOVOLOG FLEXPEN) 100 UNIT/ML FlexPen Inject 2-30 Units into the skin 3 (three) times daily as needed for high blood sugar (CBG >150). Per sliding scale  written down at home    Yes [provider]  isosorbide mononitrate (IMDUR) 30 MG 24 hr tablet Take 30 mg by mouth at bedtime. 06/16/18  Yes [provider]  Melatonin 5 MG TABS Take 10 mg by mouth at bedtime.    Yes [provider]  metFORMIN (GLUCOPHAGE) 1000 MG tablet Take 1 tablet (1,000 mg total) by mouth 2 (two) times daily. For the next 2 days as you did receive IV contrast, then resume on 11/26/2018 11/23/18  Yes Elgergawy, Silver Huguenin, MD  metoprolol tartrate (LOPRESSOR) 50 MG tablet Take 25 mg by mouth 2 (two) times daily.    Yes [provider]  omeprazole (PRILOSEC) 20 MG capsule Take 20 mg by mouth 2 (two) times daily before a meal.   Yes [provider]  pravastatin (PRAVACHOL) 20 MG tablet Take 1 tablet (20 mg total) by mouth daily. 09/13/18  Yes [name], MD  topiramate (TOPAMAX) 25 MG capsule Take 25 mg by mouth 2 (two) times daily.   Yes [provider]  TRESIBA FLEXTOUCH 100 UNIT/ML SOPN FlexTouch Pen Inject 70 Units into the skin at bedtime.  08/11/18  Yes  [provider]  cetirizine (ZYRTEC) 10 MG tablet Take 10 mg by mouth at bedtime.    [provider]  ergocalciferol (VITAMIN D2) 1.25 MG (50000 UT) capsule Take 1 capsule (50,000 Units total) by mouth once a week. Patient not taking: Reported on 04/06/2019 01/23/19   Derek Jack, MD  nitroGLYCERIN (NITROSTAT) 0.4 MG SL tablet Place 1 tablet (0.4 mg total) under the tongue every 5 (five) minutes as needed for chest pain. Patient not taking: Reported on 12/26/2018 08/13/17 11/24/18  [name], MD    Family History Family History  Problem Relation Age of Onset  . Stroke Mother   . Coronary artery disease Father 82  . Asthma Sister   . Multiple sclerosis Brother   . Heart disease Paternal Uncle   . Stroke Maternal Grandmother   . Heart attack Paternal Grandmother   . Cancer Paternal Grandfather     Social History Social History   Tobacco Use  . Smoking status: Never Smoker  . Smokeless tobacco: Never Used  Substance Use Topics  . Alcohol use: No  . Drug use: No     Allergies   Divalproex sodium; Imdur [isosorbide dinitrate]; Lipitor [atorvastatin calcium]; Statins; Tramadol; Tricor [fenofibrate]; and Valproic acid   Review of Systems Review of Systems  All other systems reviewed and are negative.    Physical Exam Updated Vital Signs BP (!) 149/77   Pulse 60   Temp 98.3 F (36.8 C) (Oral)   Resp 15   Ht 5' 10"  (1.778 m)   Wt 101.8 kg   SpO2 100%   BMI 32.20 kg/m   Physical Exam Vitals signs and nursing note reviewed.  Constitutional:      Appearance: He is well-developed.  HENT:     Head: Normocephalic and atraumatic.  Eyes:     Conjunctiva/sclera: Conjunctivae normal.  Neck:     Musculoskeletal: Neck supple.  Cardiovascular:     Rate and Rhythm: Normal rate and regular rhythm.  Pulmonary:     Effort: Pulmonary effort is normal.     Breath sounds: Normal breath sounds.  Abdominal:     General: Bowel sounds are normal.      Palpations: Abdomen is soft.  Musculoskeletal: Normal range of motion.  Skin:    General: Skin is warm and dry.  Neurological:     Mental Status: He is alert and oriented to person, place, and time.  Psychiatric:        Behavior: Behavior normal.      ED Treatments / Results  Labs (all labs ordered are listed, but only abnormal results are displayed) Labs Reviewed  BASIC METABOLIC PANEL - Abnormal; Notable for the following components:      Result Value   CO2 21 (*)    Glucose, Bld 260 (*)    All other components within normal limits  CBC - Abnormal; Notable for the following components:   WBC 2.8 (*)    Platelets 79 (*)    All other components within normal limits  TROPONIN I  TROPONIN I    EKG EKG Interpretation  Date/Time:  Thursday Apr 06 2019 15:59:17 EDT Ventricular Rate:  63 PR Interval:    QRS Duration: 89 QT Interval:  419 QTC Calculation: 429 R Axis:   -2 Text Interpretation:  Sinus rhythm Confirmed by Nat Christen [phone]) on 04/06/2019 4:32:02 PM   Radiology Dg Chest 2 View  Result Date: 04/06/2019 CLINICAL DATA:  Acute onset of LEFT-sided chest pain radiating to the LEFT shoulder and LEFT neck that began at approximately 2 o'clock p.m. this afternoon. Personal history of MI, CABG and coronary artery stenting. EXAM: CHEST - 2 VIEW COMPARISON:  11/25/2018 and earlier, including CTA chest 11/24/2018. FINDINGS: Sternotomy for CABG. Cardiac silhouette upper normal in size, unchanged. Thoracic aorta mildly atherosclerotic, unchanged. Hilar and mediastinal contours otherwise unremarkable. Lungs clear. Bronchovascular markings normal. Pulmonary vascularity normal. No visible pleural effusions. No pneumothorax. Degenerative changes and DISH involving the thoracic spine. IMPRESSION: No acute cardiopulmonary disease. Electronically Signed   By: Evangeline Dakin M.D.   On: 04/06/2019 16:47    Procedures Procedures (including critical care time)  Medications  Ordered in ED Medications - No data to display   Initial Impression / Assessment and Plan / ED Course  I have reviewed the triage vital signs and the nursing notes.  Pertinent labs & imaging results that were available during my care of the patient were reviewed by me and considered in my medical decision making (see chart for details).        Patient with known history of CAD presents with chest pain.  He is hemodynamically stable.  Work-up pending.  2120: Patient was rechecked.  Hemodynamically stable.  Tests reviewed.  He will follow-up with his cardiologist.  Final Clinical Impressions(s) / ED Diagnoses   Final diagnoses:  Chest pain, unspecified type    ED Discharge Orders    None       Nat Christen, MD 04/06/19 1636    Nat Christen, MD 04/06/19 2131

## 2019-04-06 NOTE — ED Triage Notes (Signed)
Patient complains of dull, aching left sided chest pain that radiates to left shoulder neck and left arm. Patient states pain started at 1400 today with nausea and shortness of breath. History of multiple MI and stint placements.

## 2019-04-06 NOTE — Discharge Instructions (Addendum)
Tests were good.  Recommend follow-up with your cardiologist.  Return if worse.

## 2019-04-06 NOTE — ED Notes (Signed)
Pharmacy at Pt's bedside. Pt reporting free from chest pain at this time.

## 2019-04-07 ENCOUNTER — Telehealth: Payer: Self-pay | Admitting: Cardiology

## 2019-04-07 NOTE — Telephone Encounter (Signed)
Virtual Visit Pre-Appointment Phone [name]  "(Name), I am calling you today to discuss your upcoming appointment. We are currently trying to limit exposure to the virus that causes COVID-19 by seeing patients [name] home rather than in the office."  "What is the BEST phone [name] to [name] the day of the visit?" - I  1. Do you have or have access to (through a family member/friend) a smartphone with video capability that we can use for your visit?" a. If yes - list this [name] in appt notes as cell (if different from BEST phone #) [name] list the appointment type as a VIDEO visit in appointment notes b. If no - list the appointment type as a PHONE visit in appointment notes  2. [name] consent - "In the setting of the current Covid19 crisis, you are scheduled for a (phone or video) visit with your provider on (date) [name] (time).  Just as we do with many in-office visits, in order for you to participate in this visit, we must obtain consent.  If you'd like, I can send this to your mychart (if signed up) or email for you to review.  Otherwise, I can obtain your verbal consent now.  All virtual visits are billed to your insurance company just like a normal visit would be.  By agreeing to a virtual visit, we'd like you to understand that the technology does not allow for your provider to perform an examination, [name] thus may limit your provider's ability to fully assess your condition. If your provider identifies any concerns that need to be evaluated in person, we will make arrangements to do so.  Finally, though the technology is pretty good, we cannot assure that it will always work on either your or our end, [name] in the setting of a video visit, we may have to convert it to a phone-only visit.  In either situation, we cannot ensure that we have a secure connection.  Are you willing to proceed?" STAFF: Did the patient verbally acknowledge consent to telehealth visit? Document YES/NO here: YES 3.   4. Advise  patient to be prepared - "Two hours prior to your appointment, go ahead [name] check your blood pressure, pulse, oxygen saturation, [name] your weight (if you have the equipment to check those) [name] write them all down. When your visit starts, your provider will ask you for this information. If you have an Apple Watch or Kardia device, please plan to have heart rate information ready on the day of your appointment. Please have a pen [name] paper handy nearby the day of the visit as well."  5. Give patient instructions for MyChart download to smartphone OR [email or web address] as below if video visit (depending on what platform provider is using)  6. Inform patient they will receive a phone [name] 15 minutes prior to their appointment time (may be from unknown caller ID) so they should be prepared to answer    TELEPHONE [name] NOTE  BURNELL HURTA has been deemed a candidate for a follow-up tele-health visit to limit community exposure during the Covid-19 pandemic. I spoke with the patient via phone to ensure availability of phone/video [name], [name] [name], [name] discuss instructions [name] expectations.  I reminded URIAH PHILIPSON to be prepared with any vital sign [name]/or heart rhythm information that could potentially be obtained via home [name], [name] [name] prior to his visit.  Lynnda Child Slaughter 04/07/2019 10:22 AM   INSTRUCTIONS FOR DOWNLOADING THE MYCHART APP TO SMARTPHONE  - The patient must first make sure to have activated MyChart [name] know their login information - If Apple, go to [employer] [name] type in MyChart in the search bar [name] download the app. If Android, ask patient to go to [employer] [name] type in [place] in the search bar [name] download the app. The app is free but as with any other app downloads, their phone may require them to verify saved payment information or Apple/Android password.  - The patient  will need to then log into the app with their MyChart username [name] password, [name] select [place] as their healthcare provider to link the account. When it is time for your visit, go to the MyChart app, find appointments, [name] click Begin Video Visit. Be sure to Select Allow for your device to access the Microphone [name] Camera for your visit. You will then be connected, [name] your provider will be with you shortly.  **If they have any issues connecting, or need assistance please contact MyChart service desk [phone])**  **If using a computer, in order to ensure the best quality for their visit they will need to use either of the following Internet Browsers: [employer], or Google Chrome**  IF USING DOXIMITY or DOXY.ME - The patient will receive a link just prior to their visit by text.     FULL LENGTH CONSENT FOR TELE-HEALTH VISIT   I hereby voluntarily request, consent [name] authorize [facility] [name] its employed or contracted physicians, physician assistants, nurse practitioners or other licensed health care professionals (the Practitioner), to provide me with telemedicine health care services (the Services") as deemed necessary by the treating Practitioner. I acknowledge [name] consent to receive the Services by the Practitioner via telemedicine. I understand that the telemedicine visit will involve communicating with the Practitioner through live audiovisual communication technology [name] the disclosure of certain medical information by electronic transmission. I acknowledge that I have been given the opportunity to request an in-person assessment or other available alternative prior to the telemedicine visit [name] am voluntarily participating in the telemedicine visit.  I understand that I have the right to withhold or withdraw my consent to the use of telemedicine in the course of my care [name] any time, without affecting my right to future care or treatment, [name] that the Practitioner  or I may terminate the telemedicine visit [name] any time. I understand that I have the right to inspect all information obtained [name]/or recorded in the course of the telemedicine visit [name] may receive copies of available information for a reasonable fee.  I understand that some of the potential risks of receiving the Services via telemedicine include:   Delay or interruption in medical evaluation due to technological equipment failure or disruption;  Information transmitted may not be sufficient (e.g. poor resolution of images) to allow for appropriate medical decision making by the Practitioner; [name]/or   In rare instances, security protocols could fail, causing a breach of personal health information.  Furthermore, I acknowledge that it is my responsibility to provide information about my medical history, conditions [name] care that is complete [name] accurate to the best of my ability. I acknowledge that Practitioner's advice, recommendations, [name]/or decision may be based on factors not within their control, such as incomplete or inaccurate data provided by me or distortions of diagnostic images or specimens that may result from electronic transmissions. I understand that the  practice of medicine is not an Chief Strategy Officer [name] that Practitioner makes no warranties or guarantees regarding treatment outcomes. I acknowledge that I will receive a copy of this consent concurrently upon execution via email to the email address I last provided but may also request a printed copy by calling the office of [place].    I understand that my insurance will be billed for this visit.   I have read or had this consent read to me.  I understand the contents of this consent, which adequately explains the benefits [name] risks of the Services being provided via telemedicine.   I have been provided ample opportunity to ask questions regarding this consent [name] the Services [name] have had my questions answered to my  satisfaction.  I give my informed consent for the services to be provided through the use of telemedicine in my medical care  By participating in this telemedicine visit I agree to the above.

## 2019-04-07 NOTE — Telephone Encounter (Signed)
110/80 HR 60 after a few minutes checked again was 150/79 HR 70 (of note pt was upset from family argument when pt took this BP reading

## 2019-04-07 NOTE — Telephone Encounter (Signed)
Pt wife says since this morning pt has been dizzy since this morning with headache. Denies any SOB/weigh gain/swelling/chest pain. Hasn't checked HR/BP today and currently not at home, but will call us back with those readings later this afternoon.

## 2019-04-07 NOTE — Telephone Encounter (Signed)
Follow-up on reported vital signs as planned.  He has known problems with orthostasis and dizziness, this could be a recurrent problem.  Please note that I will be out of the office this afternoon, so if specific intervention or recommendations needed would review with DOD.

## 2019-04-07 NOTE — Telephone Encounter (Signed)
Patient's wife would like to speak with nurse in regards to patient being extremely dizzy. / tg

## 2019-04-09 NOTE — Progress Notes (Signed)
Virtual Visit via Telephone Note   This visit type was conducted due to national recommendations for restrictions regarding the COVID-19 Pandemic (e.g. social distancing) in an effort to limit this patient's exposure and mitigate transmission in our community.  Due to his co-morbid illnesses, this patient is at least at moderate risk for complications without adequate follow up.  This format is felt to be most appropriate for this patient at this time.  The patient did not have access to video technology/had technical difficulties with video requiring transitioning to audio format only (telephone).  All issues noted in this document were discussed and addressed.  No physical exam could be performed with this format.  Please refer to the patient's chart for his  consent to telehealth for [facility].   Date:  04/10/2019   ID:  [name] [name], DOB Jul 18, 1956, MRN [id number]  Patient Location: Home Provider Location: Office  PCP:  Terrial Rhodes, MD  Cardiologist:  Rozann Lesches, MD Electrophysiologist:  None   Evaluation Performed:  Follow-Up Visit  Chief Complaint:  Cardiac follow-up  History of Present Illness:    [name] [name] is a 63 y.o. male last seen in October 2018.  We could not establish video access today and spoke by phone.  He states that he has been under a lot of stress at home with his 63 year old son.  They have been having significant disagreements.  In the setting he has had a general elevation in his blood pressure and also intermittent angina symptoms.  He was just seen in the ED on May 28 after episode of chest pain. Troponin I levels were normal and his ECG was not acute.  I reviewed these results with him.  We went over his medications which are outlined below.  He reports compliance.  He does have intermittent episodes of dizziness and has had orthostasis in the past.  I have asked him to continue to track blood pressure.  It may be worth considering trying  to increase Imdur to twice daily depending on his angina frequency.  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  He underwent a follow-up cardiac catheterization in January of this year with stable findings compared to previous study from October 2018.  The patient does not have symptoms concerning for COVID-19 infection (fever, chills, cough, or new shortness of breath).    Past Medical History:  Diagnosis Date  . Anxiety   . Arthritis   . Asthma   . Cataract    Right eye  . Chronic lower back pain   . Coronary atherosclerosis of native coronary artery    a. LAD & OM stenting;  b. 2007 Cath: nonobs dzs, patent stents;  c. 07/2012 neg MV, EF 61%.  d. 11/20/14: [place] s/p  PCI w/ DES to pLAD and DES to OM1   . Essential hypertension, benign   . GERD (gastroesophageal reflux disease)   . Hearing loss of left ear   . History of gout   . History of hiatal hernia   . Hypercholesteremia   . Lumbar herniated disc   . MI, old   . Migraine   . OSA on CPAP   . 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 Thrombocytopenia ([place])   . TIA (transient ischemic attack) ~ 2010  . Type 2 diabetes mellitus ([place])  Past Surgical History:  Procedure Laterality Date  . ANTERIOR CERVICAL DECOMP/DISCECTOMY FUSION  1998   "C3-4"  . CARDIAC CATHETERIZATION  "several"  . CARDIAC CATHETERIZATION N/A 12/30/2015   Procedure: Left Heart Cath and Coronary Angiography;  Surgeon: [name], MD;  Location: [facility];  Service: Cardiovascular;  Laterality: N/A;  . CARPAL TUNNEL RELEASE Bilateral 2005  . CHOLECYSTECTOMY N/A 11/28/2018   Procedure: LAPAROSCOPIC CHOLECYSTECTOMY;  Surgeon: [name], MD;  Location: [facility];  Service: General;  Laterality: N/A;  . COLONOSCOPY    . CORONARY ANGIOPLASTY    . CORONARY ANGIOPLASTY WITH STENT PLACEMENT  2002; 2003; 11/20/2014   "I have 4 stents after today"  (11/20/2014)  . CORONARY ARTERY BYPASS GRAFT N/A 01/09/2016   Procedure: CORONARY ARTERY BYPASS GRAFTING (CABG) X 3 UTILIZING RIGHT AND LEFT INTERNAL MAMMARY ARTERY AND ENDOSCOPICALLY HARVESTED SAPHENEOUS VEIN.;  Surgeon: Rexene Alberts, MD;  Location: [facility];  Service: Open Heart Surgery;  Laterality: N/A;  . ESOPHAGOGASTRODUODENOSCOPY    . KNEE SURGERY Left 02/2012   "scraped; open"  . LEFT HEART CATH AND CORS/GRAFTS ANGIOGRAPHY N/A 08/09/2017   Procedure: LEFT HEART CATH AND CORS/GRAFTS ANGIOGRAPHY;  Surgeon: [name], MD;  Location: [facility] [facility];  Service: Cardiovascular;  Laterality: N/A;  . LEFT HEART CATH AND CORS/GRAFTS ANGIOGRAPHY N/A 11/22/2018   Procedure: LEFT HEART CATH AND CORS/GRAFTS ANGIOGRAPHY;  Surgeon: [name], [name], MD;  Location: [facility];  Service: Cardiovascular;  Laterality: N/A;  . LEFT HEART CATHETERIZATION WITH CORONARY ANGIOGRAM N/A 07/20/2012   Procedure: LEFT HEART CATHETERIZATION WITH CORONARY ANGIOGRAM;  Surgeon: [name], MD;  Location: [place] [facility];  Service: Cardiovascular;  Laterality: N/A;  . LEFT HEART CATHETERIZATION WITH CORONARY ANGIOGRAM N/A 11/20/2014   Procedure: LEFT HEART CATHETERIZATION WITH CORONARY ANGIOGRAM;  Surgeon: [name], MD;  Location: [facility] C[facility];  Service: Cardiovascular;  Laterality: N/A;  . LEFT HEART CATHETERIZATION WITH CORONARY ANGIOGRAM N/A 11/26/2014   Procedure: LEFT HEART CATHETERIZATION WITH CORONARY ANGIOGRAM;  Surgeon: [name], MD;  Location: [facility] C[facility] LAB;  Service: Cardiovascular;  Laterality: N/A;  . NEUROPLASTY / TRANSPOSITION ULNAR NERVE AT ELBOW Right ~ 2012  . PERCUTANEOUS CORONARY ROTOBLATOR INTERVENTION (PCI-R)  11/20/2014   Procedure: PERCUTANEOUS CORONARY ROTOBLATOR INTERVENTION (PCI-R);  Surgeon: [name], MD;  Location: [facility] CATH LAB;  Service: Cardiovascular;;  . SHOULDER ARTHROSCOPY Left ~ 2011  . TEE WITHOUT CARDIOVERSION N/A 01/09/2016   Procedure: TRANSESOPHAGEAL  ECHOCARDIOGRAM (TEE);  Surgeon: Rexene Alberts, MD;  Location: [place];  Service: Open Heart Surgery;  Laterality: N/A;     Current Meds  Medication Sig  . albuterol (PROVENTIL HFA;VENTOLIN HFA) 108 (90 BASE) MCG/ACT inhaler Inhale 2 puffs into the lungs every 6 (six) hours as needed for wheezing or shortness of breath.  [name] amLODipine (NORVASC) 5 MG tablet Take 5 mg by mouth at bedtime.  [name] aspirin EC 81 MG tablet Take 81 mg by mouth at bedtime.   . cetirizine (ZYRTEC) 10 MG tablet Take 10 mg by mouth at bedtime.  . citalopram (CELEXA) 20 MG tablet Take 20 mg by mouth at bedtime.   [name] ezetimibe (ZETIA) 10 MG tablet Take 10 mg by mouth at bedtime.  . fluticasone (FLONASE) 50 MCG/ACT nasal spray Place 1 spray into the nose 2 (two) times daily.   . Fluticasone-Salmeterol (ADVAIR) 250-50 MCG/DOSE AEPB Inhale 1 puff into the lungs every 12 (twelve) hours.  . gabapentin (NEURONTIN) 300 MG  capsule Take 300 mg by mouth 3 (three) times daily.  . insulin aspart (NOVOLOG FLEXPEN) 100 UNIT/ML FlexPen Inject 2-30 Units into the skin 3 (three) times daily as needed for high blood sugar (CBG >150). Per sliding scale written down at home   . isosorbide mononitrate (IMDUR) 30 MG 24 hr tablet Take 30 mg by mouth at bedtime.  . Melatonin 5 MG TABS Take 10 mg by mouth at bedtime.   . metFORMIN (GLUCOPHAGE) 1000 MG tablet Take 1 tablet (1,000 mg total) by mouth 2 (two) times daily. For the next 2 days as you did receive IV contrast, then resume on 11/26/2018  . metoprolol tartrate (LOPRESSOR) 50 MG tablet Take 25 mg by mouth 2 (two) times daily.   . nitroGLYCERIN (NITROSTAT) 0.4 MG SL tablet Place 1 tablet (0.4 mg total) under the tongue every 5 (five) minutes x 3 doses as needed for chest pain (if no relief after 3rd dose, proceed to the ED for an evaluation or call 911).  [name] omeprazole (PRILOSEC) 20 MG capsule Take 20 mg by mouth 2 (two) times daily before a meal.  . pravastatin (PRAVACHOL) 20 MG tablet Take 1 tablet  (20 mg total) by mouth daily.  [name] topiramate (TOPAMAX) 25 MG capsule Take 25 mg by mouth 2 (two) times daily.  [name] FLEXTOUCH 100 UNIT/ML SOPN FlexTouch Pen Inject 70 Units into the skin at bedtime.   . [DISCONTINUED] nitroGLYCERIN (NITROSTAT) 0.4 MG SL tablet Place 1 tablet (0.4 mg total) under the tongue every 5 (five) minutes as needed for chest pain.     Allergies:   Divalproex sodium; Imdur [isosorbide dinitrate]; Lipitor [atorvastatin calcium]; Statins; Tramadol; Tricor [fenofibrate]; and Valproic acid   Social History   Tobacco Use  . Smoking status: Never Smoker  . Smokeless tobacco: Never Used  Substance Use Topics  . Alcohol use: No  . Drug use: No     Family Hx: The patient's family history includes Asthma in his sister; Cancer in his paternal grandfather; Coronary artery disease (age of onset: 53) in his father; Heart attack in his paternal grandmother; Heart disease in his paternal uncle; Multiple sclerosis in his brother; Stroke in his maternal grandmother and mother.  ROS:   Please see the history of present illness.    All other systems reviewed and are negative.   Prior CV studies:   The following studies were reviewed today:  Cardiac catheterization 11/22/2018:  Prox LAD to Mid LAD lesion is 90% stenosed.  Mid LAD lesion is 90% stenosed.  Ost LAD lesion is 50% stenosed.  1st Diag lesion is 80% stenosed.  Mid RCA lesion is 40% stenosed.  Dist RCA lesion is 40% stenosed.  Ost RPDA lesion is 100% stenosed.  SVG and is small.  LIMA and is small.  1st Mrg lesion is 99% stenosed.  Prox Cx lesion is 50% stenosed.  RIMA graft was visualized by angiography and is normal in caliber.  Origin to Prox Graft lesion is 40% stenosed.  LV end diastolic pressure is normal.   1. Severe 3 vessel obstructive CAD 2. Patent LIMA to the distal LAD 3. Patent free RIMA to the OM1 4. Patent SVG to PDA 5. Normal LVEDP  Compared to prior angiogram dated  08/09/17 there is no significant change. Recommend continued medical therapy.  Echocardiogram 11/21/2018: Study Conclusions  - Left ventricle: The cavity size was normal. Wall thickness was   increased in a pattern of mild LVH. Systolic function was normal.  The estimated ejection fraction was in the range of 55% to 60%.   Wall motion was normal; there were no regional wall motion   abnormalities. Definity contrast was utilized. Indeterminate   diastolic function. - Aortic valve: There was mild regurgitation. - Mitral valve: Mildly calcified annulus. - Right atrium: Central venous pressure (est): 3 mm Hg. - Atrial septum: No defect or patent foramen ovale was identified.  - Tricuspid valve: There was mild regurgitation. - Pulmonary arteries: PA peak pressure: 25 mm Hg (S). - Pericardium, extracardiac: There was no pericardial effusion.  Carotid Dopplers 07/10/2018:  Final Interpretation: Right Carotid: Velocities in the right ICA are consistent with a 1-39% stenosis.  Left Carotid: Velocities in the left ICA are consistent with a 1-39% stenosis.  Vertebrals: Bilateral vertebral arteries demonstrate antegrade flow. Right             vertebral artery demonstrates high resistant flow.  Labs/Other Tests and Data Reviewed:    EKG:  An ECG dated 04/06/2019 was personally reviewed today and demonstrated:  Normal sinus rhythm.  Recent Labs: 11/30/2018: ALT 29 04/06/2019: BUN 19; Creatinine, Ser 1.07; Hemoglobin 14.1; Platelets 79; Potassium 4.2; Sodium 136   Recent Lipid Panel Lab Results  Component Value Date/Time   CHOL 114 07/09/2018 03:13 AM   TRIG 250 (H) 07/09/2018 03:13 AM   HDL 26 (L) 07/09/2018 03:13 AM   CHOLHDL 4.4 07/09/2018 03:13 AM   LDLCALC 38 07/09/2018 03:13 AM    Wt Readings from Last 3 Encounters:  04/10/19 225 lb (102.1 kg)  04/06/19 224 lb 6.9 oz (101.8 kg)  01/23/19 224 lb 8 oz (101.8 kg)     Objective:    Vital Signs:  BP (!) 166/89   Pulse 71   Ht  5' 9"  (1.753 m)   Wt 225 lb (102.1 kg)   BMI 33.23 kg/m    Patient spoke in full sentences, not short of breath. No audible wheezing or coughing. Speech pattern normal.  ASSESSMENT & PLAN:    1.  Multivessel CAD status post CABG with cardiac catheterization in January of this year showing patent bypass grafts and plan for medical therapy.  He continues to have intermittent angina symptoms as before.  Continue with current regimen although depending on symptom frequency we could eventually consider increasing Imdur to 30 mg twice daily.  Refill provided for as needed nitroglycerin.  2.  Essential hypertension, blood pressure is elevated today, although he also has history of intermittent lightheadedness and orthostasis.  Continue to follow.  3.  Mixed hyperlipidemia with history of statin intolerance in the past but tolerating the Pravachol so far with follow-up by PCP.  Last LDL was actually 38.  4.  OSA on CPAP.  COVID-19 Education: The signs and symptoms of COVID-19 were discussed with the patient and how to seek care for testing (follow up with PCP or arrange E-visit).  The importance of social distancing was discussed today.  Time:   Today, I have spent 8 minutes with the patient with telehealth technology discussing the above problems.     Medication Adjustments/Labs and Tests Ordered: Current medicines are reviewed at length with the patient today.  Concerns regarding medicines are outlined above.   Tests Ordered: No orders of the defined types were placed in this encounter.   Medication Changes: Meds ordered this encounter  Medications  . nitroGLYCERIN (NITROSTAT) 0.4 MG SL tablet    Sig: Place 1 tablet (0.4 mg total) under the tongue every 5 (five) minutes  x 3 doses as needed for chest pain (if no relief after 3rd dose, proceed to the ED for an evaluation or call 911).    Dispense:  25 tablet    Refill:  3    Disposition:  Follow up 6 months in the [place] office.   Signed, Rozann Lesches, MD  04/10/2019 10:23 AM    [place]

## 2019-04-09 NOTE — Telephone Encounter (Signed)
No change for now.

## 2019-04-10 ENCOUNTER — Encounter: Payer: Self-pay | Admitting: Cardiology

## 2019-04-10 ENCOUNTER — Telehealth (INDEPENDENT_AMBULATORY_CARE_PROVIDER_SITE_OTHER): Payer: Medicare Other | Admitting: Cardiology

## 2019-04-10 VITALS — BP 166/89 | HR 71 | Ht 69.0 in | Wt 225.0 lb

## 2019-04-10 DIAGNOSIS — I6523 Occlusion and stenosis of bilateral carotid arteries: Secondary | ICD-10-CM

## 2019-04-10 DIAGNOSIS — I25119 Atherosclerotic heart disease of native coronary artery with unspecified angina pectoris: Secondary | ICD-10-CM | POA: Diagnosis not present

## 2019-04-10 DIAGNOSIS — I10 Essential (primary) hypertension: Secondary | ICD-10-CM

## 2019-04-10 DIAGNOSIS — Z7189 Other specified counseling: Secondary | ICD-10-CM

## 2019-04-10 DIAGNOSIS — E782 Mixed hyperlipidemia: Secondary | ICD-10-CM | POA: Diagnosis not present

## 2019-04-10 DIAGNOSIS — G4733 Obstructive sleep apnea (adult) (pediatric): Secondary | ICD-10-CM

## 2019-04-10 MED ORDER — NITROGLYCERIN 0.4 MG SL SUBL: 0.4000 mg | SUBLINGUAL_TABLET | SUBLINGUAL | 3 refills | Status: DC | PRN

## 2019-04-10 NOTE — Patient Instructions (Addendum)

## 2019-04-13 ENCOUNTER — Ambulatory Visit: Payer: Self-pay

## 2019-04-13 ENCOUNTER — Other Ambulatory Visit: Payer: Self-pay

## 2019-04-13 ENCOUNTER — Ambulatory Visit (INDEPENDENT_AMBULATORY_CARE_PROVIDER_SITE_OTHER): Payer: Medicare Other | Admitting: Rheumatology

## 2019-04-13 ENCOUNTER — Ambulatory Visit (INDEPENDENT_AMBULATORY_CARE_PROVIDER_SITE_OTHER): Payer: Medicare Other

## 2019-04-13 ENCOUNTER — Encounter: Payer: Self-pay | Admitting: Rheumatology

## 2019-04-13 VITALS — BP 128/75 | HR 63 | Resp 13 | Ht 69.0 in | Wt 230.4 lb

## 2019-04-13 DIAGNOSIS — I6330 Cerebral infarction due to thrombosis of unspecified cerebral artery: Secondary | ICD-10-CM

## 2019-04-13 DIAGNOSIS — M25561 Pain in right knee: Secondary | ICD-10-CM

## 2019-04-13 DIAGNOSIS — M79672 Pain in left foot: Secondary | ICD-10-CM

## 2019-04-13 DIAGNOSIS — M79671 Pain in right foot: Secondary | ICD-10-CM

## 2019-04-13 DIAGNOSIS — M79641 Pain in right hand: Secondary | ICD-10-CM

## 2019-04-13 DIAGNOSIS — G459 Transient cerebral ischemic attack, unspecified: Secondary | ICD-10-CM

## 2019-04-13 DIAGNOSIS — E782 Mixed hyperlipidemia: Secondary | ICD-10-CM

## 2019-04-13 DIAGNOSIS — I493 Ventricular premature depolarization: Secondary | ICD-10-CM

## 2019-04-13 DIAGNOSIS — M25562 Pain in left knee: Secondary | ICD-10-CM | POA: Diagnosis not present

## 2019-04-13 DIAGNOSIS — R768 Other specified abnormal immunological findings in serum: Secondary | ICD-10-CM

## 2019-04-13 DIAGNOSIS — G4733 Obstructive sleep apnea (adult) (pediatric): Secondary | ICD-10-CM

## 2019-04-13 DIAGNOSIS — G8929 Other chronic pain: Secondary | ICD-10-CM

## 2019-04-13 DIAGNOSIS — Z8659 Personal history of other mental and behavioral disorders: Secondary | ICD-10-CM

## 2019-04-13 DIAGNOSIS — M79642 Pain in left hand: Secondary | ICD-10-CM | POA: Diagnosis not present

## 2019-04-13 DIAGNOSIS — Z9861 Coronary angioplasty status: Secondary | ICD-10-CM

## 2019-04-13 DIAGNOSIS — I10 Essential (primary) hypertension: Secondary | ICD-10-CM

## 2019-04-13 DIAGNOSIS — I2510 Atherosclerotic heart disease of native coronary artery without angina pectoris: Secondary | ICD-10-CM

## 2019-04-13 DIAGNOSIS — M5030 Other cervical disc degeneration, unspecified cervical region: Secondary | ICD-10-CM

## 2019-04-13 DIAGNOSIS — D61818 Other pancytopenia: Secondary | ICD-10-CM

## 2019-04-13 DIAGNOSIS — K760 Fatty (change of) liver, not elsewhere classified: Secondary | ICD-10-CM

## 2019-04-13 DIAGNOSIS — Z8639 Personal history of other endocrine, nutritional and metabolic disease: Secondary | ICD-10-CM

## 2019-04-13 NOTE — Patient Instructions (Signed)
Knee Exercises Ask your health care provider which exercises are safe for you. Do exercises exactly as told by your health care provider and adjust them as directed. It is normal to feel mild stretching, pulling, tightness, or discomfort as you do these exercises, but you should stop right away if you feel sudden pain or your pain gets worse.Do not begin these exercises until told by your health care provider. STRETCHING AND RANGE OF MOTION EXERCISES These exercises warm up your muscles and joints and improve the movement and flexibility of your knee. These exercises also help to relieve pain, numbness, and tingling. Exercise A: Knee Extension, Prone 1. Lie on your abdomen on a bed. 2. Place your left / right knee just beyond the edge of the surface so your knee is not on the bed. You can put a towel under your left / right thigh just above your knee for comfort. 3. Relax your leg muscles and allow gravity to straighten your knee. You should feel a stretch behind your left / right knee. 4. Hold this position for __________ seconds. 5. Scoot up so your knee is supported between repetitions. Repeat __________ times. Complete this stretch __________ times a day. Exercise B: Knee Flexion, Active  1. Lie on your back with both knees straight. If this causes back discomfort, bend your left / right knee so your foot is flat on the floor. 2. Slowly slide your left / right heel back toward your buttocks until you feel a gentle stretch in the front of your knee or thigh. 3. Hold this position for __________ seconds. 4. Slowly slide your left / right heel back to the starting position. Repeat __________ times. Complete this exercise __________ times a day. Exercise C: Quadriceps, Prone  1. Lie on your abdomen on a firm surface, such as a bed or padded floor. 2. Bend your left / right knee and hold your ankle. If you cannot reach your ankle or pant leg, loop a belt around your foot and grab the belt  instead. 3. Gently pull your heel toward your buttocks. Your knee should not slide out to the side. You should feel a stretch in the front of your thigh and knee. 4. Hold this position for __________ seconds. Repeat __________ times. Complete this stretch __________ times a day. Exercise D: Hamstring, Supine 1. Lie on your back. 2. Loop a belt or towel over the ball of your left / right foot. The ball of your foot is on the walking surface, right under your toes. 3. Straighten your left / right knee and slowly pull on the belt to raise your leg until you feel a gentle stretch behind your knee. ? Do not let your left / right knee bend while you do this. ? Keep your other leg flat on the floor. 4. Hold this position for __________ seconds. Repeat __________ times. Complete this stretch __________ times a day. STRENGTHENING EXERCISES These exercises build strength and endurance in your knee. Endurance is the ability to use your muscles for a long time, even after they get tired. Exercise E: Quadriceps, Isometric  1. Lie on your back with your left / right leg extended and your other knee bent. Put a rolled towel or small pillow under your knee if told by your health care provider. 2. Slowly tense the muscles in the front of your left / right thigh. You should see your kneecap slide up toward your hip or see increased dimpling just above the knee. This  motion will push the back of the knee toward the floor. 3. For __________ seconds, keep the muscle as tight as you can without increasing your pain. 4. Relax the muscles slowly and completely. Repeat __________ times. Complete this exercise __________ times a day. Exercise F: Straight Leg Raises - Quadriceps 1. Lie on your back with your left / right leg extended and your other knee bent. 2. Tense the muscles in the front of your left / right thigh. You should see your kneecap slide up or see increased dimpling just above the knee. Your thigh may  even shake a bit. 3. Keep these muscles tight as you raise your leg 4-6 inches (10-15 cm) off the floor. Do not let your knee bend. 4. Hold this position for __________ seconds. 5. Keep these muscles tense as you lower your leg. 6. Relax your muscles slowly and completely after each repetition. Repeat __________ times. Complete this exercise __________ times a day. Exercise G: Hamstring, Isometric 1. Lie on your back on a firm surface. 2. Bend your left / right knee approximately __________ degrees. 3. Dig your left / right heel into the surface as if you are trying to pull it toward your buttocks. Tighten the muscles in the back of your thighs to dig as hard as you can without increasing any pain. 4. Hold this position for __________ seconds. 5. Release the tension gradually and allow your muscles to relax completely for __________ seconds after each repetition. Repeat __________ times. Complete this exercise __________ times a day. Exercise H: Hamstring Curls  If told by your health care provider, do this exercise while wearing ankle weights. Begin with __________ weights. Then increase the weight by 1 lb (0.5 kg) increments. Do not wear ankle weights that are more than __________. 1. Lie on your abdomen with your legs straight. 2. Bend your left / right knee as far as you can without feeling pain. Keep your hips flat against the floor. 3. Hold this position for __________ seconds. 4. Slowly lower your leg to the starting position.  Repeat __________ times. Complete this exercise __________ times a day. Exercise I: Squats (Quadriceps) 1. Stand in front of a table, with your feet and knees pointing straight ahead. You may rest your hands on the table for balance but not for support. 2. Slowly bend your knees and lower your hips like you are going to sit in a chair. ? Keep your weight over your heels, not over your toes. ? Keep your lower legs upright so they are parallel with the table  legs. ? Do not let your hips go lower than your knees. ? Do not bend lower than told by your health care provider. ? If your knee pain increases, do not bend as low. 3. Hold the squat position for __________ seconds. 4. Slowly push with your legs to return to standing. Do not use your hands to pull yourself to standing. Repeat __________ times. Complete this exercise __________ times a day. Exercise J: Wall Slides (Quadriceps)  1. Lean your back against a smooth wall or door while you walk your feet out 18-24 inches (46-61 cm) from it. 2. Place your feet hip-width apart. 3. Slowly slide down the wall or door until your knees bend __________ degrees. Keep your knees over your heels, not over your toes. Keep your knees in line with your hips. 4. Hold for __________ seconds. Repeat __________ times. Complete this exercise __________ times a day. Exercise K: Straight Leg Raises -  Hip Abductors 1. Lie on your side with your left / right leg in the top position. Lie so your head, shoulder, knee, and hip line up. You may bend your bottom knee to help you keep your balance. 2. Roll your hips slightly forward so your hips are stacked directly over each other and your left / right knee is facing forward. 3. Leading with your heel, lift your top leg 4-6 inches (10-15 cm). You should feel the muscles in your outer hip lifting. ? Do not let your foot drift forward. ? Do not let your knee roll toward the ceiling. 4. Hold this position for __________ seconds. 5. Slowly return your leg to the starting position. 6. Let your muscles relax completely after each repetition. Repeat __________ times. Complete this exercise __________ times a day. Exercise L: Straight Leg Raises - Hip Extensors 1. Lie on your abdomen on a firm surface. You can put a pillow under your hips if that is more comfortable. 2. Tense the muscles in your buttocks and lift your left / right leg about 4-6 inches (10-15 cm). Keep your knee  straight as you lift your leg. 3. Hold this position for __________ seconds. 4. Slowly lower your leg to the starting position. 5. Let your leg relax completely after each repetition. Repeat __________ times. Complete this exercise __________ times a day. This information is not intended to replace advice given to you by your health care provider. Make sure you discuss any questions you have with your health care provider. Document Released: 09/09/2005 Document Revised: 07/20/2016 Document Reviewed: 09/01/2015 Elsevier Interactive Patient Education  2018 [place] Exercises Hand exercises can be helpful to almost anyone. These exercises can strengthen the hands, improve flexibility and movement, and increase blood flow to the hands. These results can make work and daily tasks easier. Hand exercises can be especially helpful for people who have joint pain from arthritis or have nerve damage from overuse (carpal tunnel syndrome). These exercises can also help people who have injured a hand. Most of these hand exercises are fairly gentle stretching routines. You can do them often throughout the day. Still, it is a good idea to ask your health care provider which exercises would be best for you. Warming your hands before exercise may help to reduce stiffness. You can do this with gentle massage or by placing your hands in warm water for 15 minutes. Also, make sure you pay attention to your level of hand pain as you begin an exercise routine. Exercises Knuckle bend Repeat this exercise 5-10 times with each hand. 1. Stand or sit with your arm, hand, and all five fingers pointed straight up. Make sure your wrist is straight. 2. Gently and slowly bend your fingers down and inward until the tips of your fingers are touching the tops of your palm. 3. Hold this position for a few seconds. 4. Extend your fingers out to their original position, all pointing straight up again. Finger fan Repeat this  exercise 5-10 times with each hand. 1. Hold your arm and hand out in front of you. Keep your wrist straight. 2. Squeeze your hand into a fist. 3. Hold this position for a few seconds. 4. Edison Simon out, or spread apart, your hand and fingers as much as possible, stretching every joint fully. Tabletop Repeat this exercise 5-10 times with each hand. 1. Stand or sit with your arm, hand, and all five fingers pointed straight up. Make sure your wrist is straight. 2. Gently  and slowly bend your fingers at the knuckles where they meet the hand until your hand is making an upside-down L shape. Your fingers should form a tabletop. 3. Hold this position for a few seconds. 4. Extend your fingers out to their original position, all pointing straight up again. Making Os Repeat this exercise 5-10 times with each hand. 1. Stand or sit with your arm, hand, and all five fingers pointed straight up. Make sure your wrist is straight. 2. Make an O shape by touching your pointer finger to your thumb. Hold for a few seconds. Then open your hand wide. 3. Repeat this motion with each finger on your hand. Table spread Repeat this exercise 5-10 times with each hand. 1. Place your hand on a table with your palm facing down. Make sure your wrist is straight. 2. Spread your fingers out as much as possible. Hold this position for a few seconds. 3. Slide your fingers back together again. Hold for a few seconds. Ball grip Repeat this exercise 10-15 times with each hand. 1. Hold a tennis ball or another soft ball in your hand. 2. While slowly increasing pressure, squeeze the ball as hard as possible. 3. Squeeze as hard as you can for 3-5 seconds. 4. Relax and repeat.  Wrist curls Repeat this exercise 10-15 times with each hand. 1. Sit in a chair that has armrests. 2. Hold a light weight in your hand, such as a dumbbell that weighs 1-3 pounds (0.5-1.4 kg). Ask your health care provider what weight would be best for you. 3.  Rest your hand just over the end of the chair arm with your palm facing up. 4. Gently pivot your wrist up and down while holding the weight. Do not twist your wrist from side to side. Contact a health care provider if:  Your hand pain or discomfort gets much worse when you do an exercise.  Your hand pain or discomfort does not improve within 2 hours after you exercise. If you have any of these problems, stop doing these exercises right away. Do not do them again unless your health care provider says that you can. Get help right away if:  You develop sudden, severe hand pain. If this happens, stop doing these exercises right away. Do not do them again unless your health care provider says that you can. This information is not intended to replace advice given to you by your health care provider. Make sure you discuss any questions you have with your health care provider. Document Released: 10/07/2015 Document Revised: 03/01/2018 Document Reviewed: 05/06/2015 Elsevier Interactive Patient Education  2019 [employer].

## 2019-04-26 ENCOUNTER — Other Ambulatory Visit (HOSPITAL_COMMUNITY): Payer: Medicare Other

## 2019-04-27 ENCOUNTER — Other Ambulatory Visit: Payer: Self-pay

## 2019-04-27 ENCOUNTER — Inpatient Hospital Stay (HOSPITAL_COMMUNITY): Payer: Medicare Other | Attending: Hematology

## 2019-04-27 DIAGNOSIS — K7460 Unspecified cirrhosis of liver: Secondary | ICD-10-CM | POA: Diagnosis not present

## 2019-04-27 DIAGNOSIS — E559 Vitamin D deficiency, unspecified: Secondary | ICD-10-CM | POA: Diagnosis not present

## 2019-04-27 DIAGNOSIS — D696 Thrombocytopenia, unspecified: Secondary | ICD-10-CM | POA: Insufficient documentation

## 2019-04-27 DIAGNOSIS — D649 Anemia, unspecified: Secondary | ICD-10-CM | POA: Insufficient documentation

## 2019-04-27 DIAGNOSIS — D61818 Other pancytopenia: Secondary | ICD-10-CM

## 2019-04-27 LAB — CBC WITH DIFFERENTIAL/PLATELET
Abs Immature Granulocytes: 0.02 10*3/uL (ref 0.00–0.07)
Basophils Absolute: 0 10*3/uL (ref 0.0–0.1)
Basophils Relative: 1 %
Eosinophils Absolute: 0.1 10*3/uL (ref 0.0–0.5)
Eosinophils Relative: 2 %
HCT: 44.1 % (ref 39.0–52.0)
Hemoglobin: 15.3 g/dL (ref 13.0–17.0)
Immature Granulocytes: 1 %
Lymphocytes Relative: 39 %
Lymphs Abs: 1.7 10*3/uL (ref 0.7–4.0)
MCH: 29.8 pg (ref 26.0–34.0)
MCHC: 34.7 g/dL (ref 30.0–36.0)
MCV: 86 fL (ref 80.0–100.0)
Monocytes Absolute: 0.3 10*3/uL (ref 0.1–1.0)
Monocytes Relative: 7 %
Neutro Abs: 2.2 10*3/uL (ref 1.7–7.7)
Neutrophils Relative %: 50 %
Platelets: 114 10*3/uL — ABNORMAL LOW (ref 150–400)
RBC: 5.13 MIL/uL (ref 4.22–5.81)
RDW: 14.6 % (ref 11.5–15.5)
WBC: 4.4 10*3/uL (ref 4.0–10.5)
nRBC: 0 % (ref 0.0–0.2)

## 2019-04-27 LAB — COMPREHENSIVE METABOLIC PANEL
ALT: 28 U/L (ref 0–44)
AST: 25 U/L (ref 15–41)
Albumin: 3.8 g/dL (ref 3.5–5.0)
Alkaline Phosphatase: 50 U/L (ref 38–126)
Anion gap: 8 (ref 5–15)
BUN: 17 mg/dL (ref 6–20)
CO2: 24 mmol/L (ref 22–32)
Calcium: 9.1 mg/dL (ref 8.9–10.3)
Chloride: 107 mmol/L (ref 98–111)
Creatinine, Ser: 0.97 mg/dL (ref 0.61–1.24)
GFR calc Af Amer: >60 mL/min (ref >60)
GFR calc non Af Amer: >60 mL/min (ref >60)
Glucose, Bld: 104 mg/dL — ABNORMAL HIGH (ref 70–99)
Potassium: 3.6 mmol/L (ref 3.5–5.1)
Sodium: 139 mmol/L (ref 135–145)
Total Bilirubin: 0.7 mg/dL (ref 0.3–1.2)
Total Protein: 7.4 g/dL (ref 6.5–8.1)

## 2019-04-27 LAB — VITAMIN B12: Vitamin B-12: 229 pg/mL (ref 180–914)

## 2019-04-28 LAB — VITAMIN D 25 HYDROXY (VIT D DEFICIENCY, FRACTURES): Vit D, 25-Hydroxy: 21.3 ng/mL — ABNORMAL LOW (ref 30.0–100.0)

## 2019-05-02 ENCOUNTER — Other Ambulatory Visit: Payer: Self-pay

## 2019-05-03 ENCOUNTER — Inpatient Hospital Stay (HOSPITAL_BASED_OUTPATIENT_CLINIC_OR_DEPARTMENT_OTHER): Payer: Medicare Other | Admitting: Nurse Practitioner

## 2019-05-03 VITALS — BP 123/60 | HR 70 | Temp 98.5°F | Resp 18 | Wt 230.5 lb

## 2019-05-03 DIAGNOSIS — E559 Vitamin D deficiency, unspecified: Secondary | ICD-10-CM | POA: Diagnosis not present

## 2019-05-03 DIAGNOSIS — D649 Anemia, unspecified: Secondary | ICD-10-CM

## 2019-05-03 DIAGNOSIS — D696 Thrombocytopenia, unspecified: Secondary | ICD-10-CM | POA: Diagnosis not present

## 2019-05-03 DIAGNOSIS — K7460 Unspecified cirrhosis of liver: Secondary | ICD-10-CM | POA: Diagnosis not present

## 2019-05-03 MED ORDER — ERGOCALCIFEROL 1.25 MG (50000 UT) PO CAPS: 50000.0000 [IU] | ORAL_CAPSULE | ORAL | 1 refills | Status: DC

## 2019-05-03 NOTE — Patient Instructions (Signed)
[place] at [facility] Discharge Instructions  Follow up in 4 months with labs.   Thank you for choosing [place] at [facility] to provide your oncology and hematology care.  To afford each patient quality time with our provider, please arrive at least 15 minutes before your scheduled appointment time.   If you have a lab appointment with the [facility] please come in thru the  Main Entrance and check in at the main information desk  You need to re-schedule your appointment should you arrive 10 or more minutes late.  We strive to give you quality time with our providers, and arriving late affects you and other patients whose appointments are after yours.  Also, if you no show three or more times for appointments you may be dismissed from the clinic at the providers discretion.     Again, thank you for choosing [facility].  Our hope is that these requests will decrease the amount of time that you wait before being seen by our physicians.       _____________________________________________________________  Should you have questions after your visit to [facility], please contact our office at [phone] between the hours of 8:00 a.m. and 4:30 p.m.  Voicemails left after 4:00 p.m. will not be returned until the following business day.  For prescription refill requests, have your pharmacy contact our office and allow 72 hours.    Cancer Center Support Programs:   > Cancer Support Group  2nd Tuesday of the month 1pm-2pm, Journey Room

## 2019-05-03 NOTE — Progress Notes (Signed)
[place], [place] [address]   CLINIC:  Medical Oncology/Hematology  PCP:  Terrial Rhodes, MD [place] [address] [phone]   REASON FOR VISIT: Follow-up for thrombocytopenia and normocytic anemia  CURRENT THERAPY: Observation and oral iron supplementation   INTERVAL HISTORY:  [name] [name] 63 y.o. male returns for routine follow-up for thrombocytopenia and normocytic anemia.  He reports he is mildly fatigue throughout the day.  He does occasionally have intermittent chest pains when he is in a high stress situation.  He reports his home life is very stressful dealing with his 51 year old son. Denies any nausea, vomiting, or diarrhea. Denies any new pains. Had not noticed any recent bleeding such as epistaxis, hematuria or hematochezia. Denies recent chest pain on exertion, shortness of breath on minimal exertion, pre-syncopal episodes, or palpitations. Denies any numbness or tingling in hands or feet. Denies any recent fevers, infections, or recent hospitalizations. Patient reports appetite at 100 % and energy level at 50 %.  He is eating well and maintaining his weight at this time.    REVIEW OF SYSTEMS:  Review of Systems  Constitutional: Positive for fatigue.  Respiratory: Positive for shortness of breath.   All other systems reviewed and are negative.    PAST MEDICAL/SURGICAL HISTORY:  Past Medical History:  Diagnosis Date  . Anxiety   . Arthritis   . Asthma   . Cataract    Right eye  . Chronic lower back pain   . Coronary atherosclerosis of native coronary artery    a. LAD & OM stenting;  b. 2007 Cath: nonobs dzs, patent stents;  c. 07/2012 neg MV, EF 61%.  d. 11/20/14: [place] s/p  PCI w/ DES to pLAD and DES to OM1   . Essential hypertension, benign   . GERD (gastroesophageal reflux disease)   . Hearing loss of left ear   . History of gout   . History of hiatal hernia   . Hypercholesteremia   . Lumbar  herniated disc   . MI, old   . Migraine   . OSA on CPAP   . 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 Thrombocytopenia ([place])   . TIA (transient ischemic attack) ~ 2010  . Type 2 diabetes mellitus ([place])    Past Surgical History:  Procedure Laterality Date  . ANTERIOR CERVICAL DECOMP/DISCECTOMY FUSION  1998   "C3-4"  . CARDIAC CATHETERIZATION  "several"  . CARDIAC CATHETERIZATION N/A 12/30/2015   Procedure: Left Heart Cath and Coronary Angiography;  Surgeon: [name], MD;  Location: [facility];  Service: Cardiovascular;  Laterality: N/A;  . CARPAL TUNNEL RELEASE Bilateral 2005  . CHOLECYSTECTOMY N/A 11/28/2018   Procedure: LAPAROSCOPIC CHOLECYSTECTOMY;  Surgeon: [name], MD;  Location: [facility];  Service: General;  Laterality: N/A;  . COLONOSCOPY    . CORONARY ANGIOPLASTY    . CORONARY ANGIOPLASTY WITH STENT PLACEMENT  2002; 2003; 11/20/2014   "I have 4 stents after today" (11/20/2014)  . CORONARY ARTERY BYPASS GRAFT N/A 01/09/2016   Procedure: CORONARY ARTERY BYPASS GRAFTING (CABG) X 3 UTILIZING RIGHT AND LEFT INTERNAL MAMMARY ARTERY AND ENDOSCOPICALLY HARVESTED SAPHENEOUS VEIN.;  Surgeon: Rexene Alberts, MD;  Location: [facility];  Service: Open Heart Surgery;  Laterality: N/A;  . ESOPHAGOGASTRODUODENOSCOPY    . KNEE SURGERY Left 02/2012   "scraped; open"  .  LEFT HEART CATH AND CORS/GRAFTS ANGIOGRAPHY N/A 08/09/2017   Procedure: LEFT HEART CATH AND CORS/GRAFTS ANGIOGRAPHY;  Surgeon: [name], MD;  Location: [facility];  Service: Cardiovascular;  Laterality: N/A;  . LEFT HEART CATH AND CORS/GRAFTS ANGIOGRAPHY N/A 11/22/2018   Procedure: LEFT HEART CATH AND CORS/GRAFTS ANGIOGRAPHY;  Surgeon: [name], [name], MD;  Location: [facility];  Service: Cardiovascular;  Laterality: N/A;  . LEFT HEART CATHETERIZATION WITH CORONARY ANGIOGRAM N/A 07/20/2012   Procedure: LEFT HEART CATHETERIZATION WITH  CORONARY ANGIOGRAM;  Surgeon: [name], MD;  Location: [facility];  Service: Cardiovascular;  Laterality: N/A;  . LEFT HEART CATHETERIZATION WITH CORONARY ANGIOGRAM N/A 11/20/2014   Procedure: LEFT HEART CATHETERIZATION WITH CORONARY ANGIOGRAM;  Surgeon: [name], MD;  Location: [facility];  Service: Cardiovascular;  Laterality: N/A;  . LEFT HEART CATHETERIZATION WITH CORONARY ANGIOGRAM N/A 11/26/2014   Procedure: LEFT HEART CATHETERIZATION WITH CORONARY ANGIOGRAM;  Surgeon: [name], MD;  Location: [facility] C[facility];  Service: Cardiovascular;  Laterality: N/A;  . NEUROPLASTY / TRANSPOSITION ULNAR NERVE AT ELBOW Right ~ 2012  . PERCUTANEOUS CORONARY ROTOBLATOR INTERVENTION (PCI-R)  11/20/2014   Procedure: PERCUTANEOUS CORONARY ROTOBLATOR INTERVENTION (PCI-R);  Surgeon: [name], MD;  Location: [facility];  Service: Cardiovascular;;  . SHOULDER ARTHROSCOPY Left ~ 2011  . TEE WITHOUT CARDIOVERSION N/A 01/09/2016   Procedure: TRANSESOPHAGEAL ECHOCARDIOGRAM (TEE);  Surgeon: Rexene Alberts, MD;  Location: [facility];  Service: Open Heart Surgery;  Laterality: N/A;     SOCIAL HISTORY:  Social History   Socioeconomic History  . Marital status: Married    Spouse name: [name] [name]  . Number of children: 3  . Years of education: 52 th   . Highest education level: Not on file  Occupational History  . Occupation: Unemployed  Social Needs  . Financial resource strain: Not on file  . Food insecurity    Worry: Not on file    Inability: Not on file  . Transportation needs    Medical: Not on file    Non-medical: Not on file  Tobacco Use  . Smoking status: Never Smoker  . Smokeless tobacco: Never Used  Substance and Sexual Activity  . Alcohol use: No  . Drug use: No  . Sexual activity: Yes  Lifestyle  . Physical activity    Days per week: Not on file    Minutes per session: Not on file  . Stress: Not on file  Relationships  . Social Herbalist on phone: Not on file     Gets together: Not on file    Attends religious service: Not on file    Active member of club or organization: Not on file    Attends meetings of clubs or organizations: Not on file    Relationship status: Not on file  . Intimate partner violence    Fear of current or ex partner: Not on file    Emotionally abused: Not on file    Physically abused: Not on file    Forced sexual activity: Not on file  Other Topics Concern  . Not on file  Social History Narrative   Patient lives at home with wife [name] [name].   Patient works at [employer], Engineer, petroleum.   Patient has a 12 th grade education.    Patient has 3 children.        FAMILY HISTORY:  Family History  Problem Relation Age of Onset  .  Stroke Mother   . Coronary artery disease Father 70  . Asthma Sister   . Multiple sclerosis Brother   . Heart disease Paternal Uncle   . Stroke Maternal Grandmother   . Heart attack Paternal Grandmother   . Cancer Paternal Grandfather   . Asthma Sister   . Seizures Son   . Migraines Son   . Autism Son   . Migraines Son     CURRENT MEDICATIONS:  Outpatient Encounter Medications as of 05/03/2019  Medication Sig  . albuterol (PROVENTIL HFA;VENTOLIN HFA) 108 (90 BASE) MCG/ACT inhaler Inhale 2 puffs into the lungs every 6 (six) hours as needed for wheezing or shortness of breath.  [name] amLODipine (NORVASC) 5 MG tablet Take 5 mg by mouth at bedtime.  [name] aspirin EC 81 MG tablet Take 81 mg by mouth at bedtime.   . citalopram (CELEXA) 20 MG tablet Take 20 mg by mouth at bedtime.   [name] ezetimibe (ZETIA) 10 MG tablet Take 10 mg by mouth at bedtime.  . fluticasone (FLONASE) 50 MCG/ACT nasal spray Place 1 spray into the nose 2 (two) times daily.   . Fluticasone-Salmeterol (ADVAIR) 250-50 MCG/DOSE AEPB Inhale 1 puff into the lungs every 12 (twelve) hours.  . gabapentin (NEURONTIN) 300 MG capsule Take 300 mg by mouth 3 (three) times daily.  . insulin aspart (NOVOLOG FLEXPEN) 100 UNIT/ML FlexPen  Inject 2-30 Units into the skin 3 (three) times daily as needed for high blood sugar (CBG >150). Per sliding scale written down at home   . isosorbide mononitrate (IMDUR) 30 MG 24 hr tablet Take 30 mg by mouth at bedtime.  . Melatonin 5 MG TABS Take 10 mg by mouth at bedtime.   . metFORMIN (GLUCOPHAGE) 1000 MG tablet Take 1 tablet (1,000 mg total) by mouth 2 (two) times daily. For the next 2 days as you did receive IV contrast, then resume on 11/26/2018  . metoprolol tartrate (LOPRESSOR) 50 MG tablet Take 25 mg by mouth 2 (two) times daily.   . nitroGLYCERIN (NITROSTAT) 0.4 MG SL tablet Place 1 tablet (0.4 mg total) under the tongue every 5 (five) minutes x 3 doses as needed for chest pain (if no relief after 3rd dose, proceed to the ED for an evaluation or call 911).  [name] omeprazole (PRILOSEC) 20 MG capsule Take 20 mg by mouth 2 (two) times daily before a meal.  . pravastatin (PRAVACHOL) 20 MG tablet Take 1 tablet (20 mg total) by mouth daily.  [name] topiramate (TOPAMAX) 25 MG capsule Take 25 mg by mouth 2 (two) times daily.  [name] FLEXTOUCH 100 UNIT/ML SOPN FlexTouch Pen Inject 70 Units into the skin at bedtime.   . cetirizine (ZYRTEC) 10 MG tablet Take 10 mg by mouth at bedtime.  . diazepam (VALIUM) 5 MG tablet Take 5 mg by mouth every 6 (six) hours as needed for anxiety.  . ergocalciferol (VITAMIN D2) 1.25 MG (50000 UT) capsule Take 1 capsule (50,000 Units total) by mouth once a week.   No facility-administered encounter medications on file as of 05/03/2019.     ALLERGIES:  Allergies  Allergen Reactions  . Divalproex Sodium Other (See Comments)    Causes anger  . Imdur [Isosorbide Dinitrate] Other (See Comments)    Severe headache (trying lower dose 07/05/18)  . Lipitor [Atorvastatin Calcium]     cramps  . Statins Other (See Comments)    Muscle aches and cramps  . Tramadol Other (See Comments)    Chest pain   .  Tricor [Fenofibrate] Other (See Comments)    Leg cramps  . Valproic Acid  Other (See Comments)    "anger"     PHYSICAL EXAM:  ECOG Performance status: 1  Vitals:   05/03/19 0924  BP: 123/60  Pulse: 70  Resp: 18  Temp: 98.5 F (36.9 C)  SpO2: 98%   Filed Weights   05/03/19 0924  Weight: 230 lb 8 oz (104.6 kg)    Physical Exam Constitutional:      Appearance: Normal appearance. He is normal weight.  Cardiovascular:     Rate and Rhythm: Normal rate and regular rhythm.     Heart sounds: Normal heart sounds.  Pulmonary:     Effort: Pulmonary effort is normal.     Breath sounds: Normal breath sounds.  Abdominal:     General: Bowel sounds are normal.     Palpations: Abdomen is soft.  Musculoskeletal: Normal range of motion.  Skin:    General: Skin is warm and dry.  Neurological:     Mental Status: He is alert and oriented to person, place, and time. Mental status is at baseline.  Psychiatric:        Mood and Affect: Mood normal.        Behavior: Behavior normal.        Thought Content: Thought content normal.        Judgment: Judgment normal.      LABORATORY DATA:  I have reviewed the labs as listed.  CBC    Component Value Date/Time   WBC 4.4 04/27/2019 0816   RBC 5.13 04/27/2019 0816   HGB 15.3 04/27/2019 0816   HCT 44.1 04/27/2019 0816   PLT 114 (L) 04/27/2019 0816   MCV 86.0 04/27/2019 0816   MCH 29.8 04/27/2019 0816   MCHC 34.7 04/27/2019 0816   RDW 14.6 04/27/2019 0816   LYMPHSABS 1.7 04/27/2019 0816   MONOABS 0.3 04/27/2019 0816   EOSABS 0.1 04/27/2019 0816   BASOSABS 0.0 04/27/2019 0816   CMP Latest Ref Rng & Units 04/27/2019 04/06/2019 11/30/2018  Glucose 70 - 99 mg/dL 104(H) 260(H) 219(H)  BUN 6 - 20 mg/dL 17 19 22(H)  Creatinine 0.61 - 1.24 mg/dL 0.97 1.07 1.13  Sodium 135 - 145 mmol/L 139 136 137  Potassium 3.5 - 5.1 mmol/L 3.6 4.2 3.7  Chloride 98 - 111 mmol/L 107 106 104  CO2 22 - 32 mmol/L 24 21(L) 26  Calcium 8.9 - 10.3 mg/dL 9.1 9.1 8.1(L)  Total Protein 6.5 - 8.1 g/dL 7.4 - 6.3(L)  Total Bilirubin 0.3 -  1.2 mg/dL 0.7 - 0.5  Alkaline Phos 38 - 126 U/L 50 - 46  AST 15 - 41 U/L 25 - 35  ALT 0 - 44 U/L 28 - 29     I personally performed a face-to-face visit.  All questions were answered to patient's stated satisfaction. Encouraged patient to call with any new concerns or questions before his next visit to the cancer center and we can certain see him sooner, if needed.     ASSESSMENT & PLAN:   Thrombocytopenia ([place]) 1.  Thrombocytopenia: -He has mild thrombocytopenia since 2009. -Ultrasound of the abdomen on 11/22/2018 showed borderline splenomegaly, 12.3 cm, but elevated volume at 517 mL's. -CT scan of the abdomen and pelvis on 11/23/2018 showed hepatic cirrhosis.  Spleen size is normal without focal abnormality.  Hepatitis panel was negative. -Nutritional work-up included serum copper, folic acid, and W54 were all within normal limits.  SPEP was negative.  H. pylori antibodies were negative. -ANA was negative.  However rheumatoid factor was strongly positive at 68.9. - Patient does report arthritis of small joints in hands and feet.  He also has hip pains.  He was referred to a rheumatologist last visit. - Labs on 04/27/2019 showed his platelet count 114 - He will follow-up in 4 months with repeat labs  2.  Normocytic anemia: - He was found to have normocytic anemia during a recent hospital admission.  This is likely postsurgical. - He was placed on oral iron therapy with a stool softener. - He reports stool sample was brought in for occult blood testing however the results are not in the computer. -He was referred to Dr. Laural Golden.  His last colonoscopy was greater than 5 years ago in [place] on 04/27/2019 showed his hemoglobin is stable at 15.3.  Vitamin B12 was 229. -We recommended him take oral vitamin B12 daily. -He will follow-up in 4 months with repeat labs  3.  Vitamin D deficiency: - Vitamin D was initially severely low at 13.2. -He never received the  prescription for his vitamin D last visit.  So I called him in a new prescription for vitamin D 50,000 units weekly.  Sent to [facility] drug. - Labs on 04/27/2019 showed his vitamin D level at 21.3 -He will continue taking his vitamin D  -We will check at his next visit  4.  Cirrhosis: - Cholecystectomy and liver biopsy on 11/28/2018 showed cirrhosis. -He is following up with Dr. Laural Golden. - He reports his visit with Dr. Laural Golden was postponed due to [place].  However he has appointment with Delsa Grana NP in November.      Orders placed this encounter:  Orders Placed This Encounter  Procedures  . Lactate dehydrogenase  . CBC with Differential/Platelet  . Comprehensive metabolic panel  . Ferritin  . Iron and TIBC  . Vitamin B12  . VITAMIN D 25 Hydroxy (Vit-D Deficiency, Fractures)  . Folate      Francene Finders, FNP-C [place] [phone]

## 2019-05-03 NOTE — Assessment & Plan Note (Addendum)
1.  Thrombocytopenia: -He has mild thrombocytopenia since 2009. -Ultrasound of the abdomen on 11/22/2018 showed borderline splenomegaly, 12.3 cm, but elevated volume at 517 mL's. -CT scan of the abdomen and pelvis on 11/23/2018 showed hepatic cirrhosis.  Spleen size is normal without focal abnormality.  Hepatitis panel was negative. -Nutritional work-up included serum copper, folic acid, and P29 were all within normal limits.  SPEP was negative.  H. pylori antibodies were negative. -ANA was negative.  However rheumatoid factor was strongly positive at 68.9. - Patient does report arthritis of small joints in hands and feet.  He also has hip pains.  He was referred to a rheumatologist last visit. - Labs on 04/27/2019 showed his platelet count 114 - He will follow-up in 4 months with repeat labs  2.  Normocytic anemia: - He was found to have normocytic anemia during a recent hospital admission.  This is likely postsurgical. - He was placed on oral iron therapy with a stool softener. - He reports stool sample was brought in for occult blood testing however the results are not in the computer. -He was referred to Dr. Laural Golden.  His last colonoscopy was greater than 5 years ago in [place] on 04/27/2019 showed his hemoglobin is stable at 15.3.  Vitamin B12 was 229. -We recommended him take oral vitamin B12 daily. -He will follow-up in 4 months with repeat labs  3.  Vitamin D deficiency: - Vitamin D was initially severely low at 13.2. -He never received the prescription for his vitamin D last visit.  So I called him in a new prescription for vitamin D 50,000 units weekly.  Sent to [facility] drug. - Labs on 04/27/2019 showed his vitamin D level at 21.3 -He will continue taking his vitamin D  -We will check at his next visit  4.  Cirrhosis: - Cholecystectomy and liver biopsy on 11/28/2018 showed cirrhosis. -He is following up with Dr. Laural Golden. - He reports his visit with Dr. Laural Golden was  postponed due to [place].  However he has appointment with Delsa Grana NP in November.

## 2019-05-16 ENCOUNTER — Ambulatory Visit: Payer: Medicare Other | Admitting: Rheumatology

## 2019-06-05 ENCOUNTER — Ambulatory Visit (INDEPENDENT_AMBULATORY_CARE_PROVIDER_SITE_OTHER): Payer: Medicare Other | Admitting: Internal Medicine

## 2019-06-27 ENCOUNTER — Telehealth: Payer: Self-pay | Admitting: Adult Health

## 2019-06-27 ENCOUNTER — Other Ambulatory Visit: Payer: Self-pay

## 2019-06-27 ENCOUNTER — Encounter: Payer: Self-pay | Admitting: Adult Health

## 2019-06-27 ENCOUNTER — Ambulatory Visit (INDEPENDENT_AMBULATORY_CARE_PROVIDER_SITE_OTHER): Payer: Medicare Other | Admitting: Adult Health

## 2019-06-27 VITALS — BP 127/77 | HR 69 | Temp 97.7°F | Ht 69.0 in | Wt 235.4 lb

## 2019-06-27 DIAGNOSIS — E1100 Type 2 diabetes mellitus with hyperosmolarity without nonketotic hyperglycemic-hyperosmolar coma (NKHHC): Secondary | ICD-10-CM

## 2019-06-27 DIAGNOSIS — G3281 Cerebellar ataxia in diseases classified elsewhere: Secondary | ICD-10-CM | POA: Diagnosis not present

## 2019-06-27 DIAGNOSIS — G4733 Obstructive sleep apnea (adult) (pediatric): Secondary | ICD-10-CM

## 2019-06-27 DIAGNOSIS — I10 Essential (primary) hypertension: Secondary | ICD-10-CM

## 2019-06-27 DIAGNOSIS — H814 Vertigo of central origin: Secondary | ICD-10-CM

## 2019-06-27 DIAGNOSIS — I639 Cerebral infarction, unspecified: Secondary | ICD-10-CM

## 2019-06-27 DIAGNOSIS — R260 Ataxic gait: Secondary | ICD-10-CM

## 2019-06-27 DIAGNOSIS — E785 Hyperlipidemia, unspecified: Secondary | ICD-10-CM

## 2019-06-27 DIAGNOSIS — R2990 Unspecified symptoms and signs involving the nervous system: Secondary | ICD-10-CM

## 2019-06-27 NOTE — Telephone Encounter (Signed)
I called pt and then spoke to wife.  Pt is asking to a rollator with seat which insurance will pay for.  Uses [employer]  Would like [place] to fax.  Would also like to get cpap so to get back on.  Last sleep study was by Dr. Scotty Court (yrs ago).  They are not sure they have study.  I told from 08/2018 order for referral for sleep consult so will update that order.

## 2019-06-27 NOTE — Telephone Encounter (Signed)
Pt has called stating he is being told Medicare wont cover the shower chair but will approve a walker.  Pt is asking for a walker with a seat.  Pt has not heard anything about his CPAP supplies yet  Please call pt to discuss

## 2019-06-27 NOTE — Progress Notes (Signed)
[facility] [address] [place]. [place] [address] [phone]       OFFICE FOLLOW UP NOTE  [name] Date of Birth:  January 25, 1956 Medical Record Number:  [id number]   Reason for Referral:  stroke follow up  CHIEF COMPLAINT:  Chief Complaint  Patient presents with  . Follow-up    Treatment room, with his wife. States there has beenm changes. Dizziness, shaking when walking.    HPI: Stroke admission 07/08/2018: [name] [name] an 63 y.o.malewith PMH TIA, DM 2, HLD, CAD s/p CABG, OSA, migraines, CHF, anxiety and arthritis who presented to [facility] with sudden onset blurry vision, diplopia, and confusion.  Telemetry neurology was consulted.  CT head was negative for acute abnormality.  CTA head and neck show calcified arthrosclerosis disease at the carotid bifurcation without hemodynamically significant stenosis without evidence of dissection or aneurysm therefore IV TPA was administered and was transferred to [facility] for further work-up and management.  MRI brain reviewed and was negative for acute infarct or hemorrhage.  MRA negative.  Carotid Doppler showed bilateral ICA stenosis of 1 to 39% with VAs antegrade.  2D echo showed an EF of 60 to 65% without cardiac source of embolus identified.  LDL 38 and recommended resuming Pravachol and Zetia at discharge.  A1c 7.3 and recommended continued follow-up with PCP for DM management.  HTN stable during admission recommended long-term BP goal normotensive range.  Patient was previously on aspirin and Plavix and it was recommended to continue this for 3 weeks and then aspirin 81 mg alone.  Patient was discharged home with recommendations of outpatient PT/OT in stable condition.  Initial consult 08/30/2018: Patient is being seen today for hospital follow up. Continues to have some blurry vision and was told he has "a weak eye" by a provider in the hospital. He has not follow up with his ophthalmology  since  discharge. 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 Has returned to driving without complications. Memory at times is slightly slower but has been improving. Completed 3 weeks of DAPT and continues aspriin 46m only without side effects of bleeding or bruising. Continues to take pravastatin and zetia without myalgias. Blood pressure today 125/71 which is normal for patient as he monitors at home. Patient currently has therapy dog which has been helping patient with his stress and anxiety. History of OSA but has not been using CPAP as he states he is unable to find the correct company to replace parts.  He has not used his CPAP in 2 to 3 months.  He was advised to follow-up with provider who is managing [place] therefore he is requesting establishment with new OSA provider.  No further concerns at this time. Denies new or worsening TIA/stroke symptoms.  Side notes, patient complains of ankle swelling towards end of the day. Denies use of compression stockings. He will elevate with positive effect but concerned due to recent worsening over the past 2-3 weeks. He does state that he has been increasing his exercise in this time with obtaining PT/OT.  He denies shortness of breath, increased fatigue or chest pain.  06/27/2019 update: Mr. [name] a 63year old male who is being seen today for stroke follow-up and new onset concerns of dizziness.  He has 1 month history of slowly worsening lightheaded sensation and gait ataxia.  He denies association with position changes or rapid head movements.  Sensation initially occurring intermittently but currently multiple times daily.  Sensation occurs sitting quietly or with ambulation.  Typically lasts for less than 1 minute and then subsides.  He  does endorse having 3 falls over the past month due to lightheaded sensation with loss of balance.  He denies change in medications, diet, activity level or stress level.  He does endorse stressful living situation with his 78 year old son but this has been ongoing and denies increased stress.  Wife endorses generalized shaking/tremors when walking and talking.  He also has association of double vision which is typically present with lightheadedness feeling.  He has had double vision in the past for which he initially presented to ED for stroke work-up in 06/2018.  Vision largely improved since that time but has now worsened with association of lightheadedness.  He has had dizziness sensation in the past related to blood pressure but feels as though the symptoms are different from what he has experienced in the past.  He does monitor blood pressure at home routinely and typically range 130s/70s.  Blood pressure today 127/77.  Continues on aspirin 81 mg and pravastatin for secondary stroke prevention without side effects.  He does have underlying history of OSA with referral placed at prior visit to establish care with a [facility] sleep provider but has not right at this time.  No further concerns at this time    ROS:   14 system review of systems performed and negative with exception of memory loss, dizziness, headache, numbness, speech difficulty, weakness and facial drooping  PMH:  Past Medical History:  Diagnosis Date  . Anxiety   . Arthritis   . Asthma   . Cataract    Right eye  . Chronic lower back pain   . Coronary atherosclerosis of native coronary artery    a. LAD & OM stenting;  b. 2007 Cath: nonobs dzs, patent stents;  c. 07/2012 neg MV, EF 61%.  d. 11/20/14: [place] s/p  PCI w/ DES to pLAD and DES to OM1   . Essential hypertension, benign   . GERD (gastroesophageal reflux disease)   . Hearing loss of left ear   . History of gout   . History of hiatal hernia   . Hypercholesteremia   . Lumbar  herniated disc   . MI, old   . Migraine   . OSA on CPAP   . 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 Thrombocytopenia ([place])   . TIA (transient ischemic attack) ~ 2010  . Type 2 diabetes mellitus (HCC)     PSH:  Past Surgical History:  Procedure Laterality Date  . ANTERIOR CERVICAL DECOMP/DISCECTOMY FUSION  1998   "C3-4"  . CARDIAC CATHETERIZATION  "several"  . CARDIAC CATHETERIZATION N/A 12/30/2015   Procedure: Left Heart Cath and Coronary Angiography;  Surgeon: [name], MD;  Location: [facility];  Service: Cardiovascular;  Laterality: N/A;  . CARPAL TUNNEL RELEASE Bilateral 2005  . CHOLECYSTECTOMY N/A 11/28/2018   Procedure: LAPAROSCOPIC CHOLECYSTECTOMY;  Surgeon: [name], MD;  Location: [facility];  Service: General;  Laterality: N/A;  . COLONOSCOPY    . CORONARY ANGIOPLASTY    . CORONARY ANGIOPLASTY WITH STENT PLACEMENT  2002; 2003; 11/20/2014   "I  have 4 stents after today" (11/20/2014)  . CORONARY ARTERY BYPASS GRAFT N/A 01/09/2016   Procedure: CORONARY ARTERY BYPASS GRAFTING (CABG) X 3 UTILIZING RIGHT AND LEFT INTERNAL MAMMARY ARTERY AND ENDOSCOPICALLY HARVESTED SAPHENEOUS VEIN.;  Surgeon: Rexene Alberts, MD;  Location: [facility];  Service: Open Heart Surgery;  Laterality: N/A;  . ESOPHAGOGASTRODUODENOSCOPY    . KNEE SURGERY Left 02/2012   "scraped; open"  . LEFT HEART CATH AND CORS/GRAFTS ANGIOGRAPHY N/A 08/09/2017   Procedure: LEFT HEART CATH AND CORS/GRAFTS ANGIOGRAPHY;  Surgeon: [name], MD;  Location: [facility];  Service: Cardiovascular;  Laterality: N/A;  . LEFT HEART CATH AND CORS/GRAFTS ANGIOGRAPHY N/A 11/22/2018   Procedure: LEFT HEART CATH AND CORS/GRAFTS ANGIOGRAPHY;  Surgeon: [name], [name], MD;  Location: [facility];  Service: Cardiovascular;  Laterality: N/A;  . LEFT HEART CATHETERIZATION WITH CORONARY ANGIOGRAM N/A 07/20/2012   Procedure: LEFT HEART  CATHETERIZATION WITH CORONARY ANGIOGRAM;  Surgeon: [name], MD;  Location: [facility];  Service: Cardiovascular;  Laterality: N/A;  . LEFT HEART CATHETERIZATION WITH CORONARY ANGIOGRAM N/A 11/20/2014   Procedure: LEFT HEART CATHETERIZATION WITH CORONARY ANGIOGRAM;  Surgeon: [name], MD;  Location: [facility];  Service: Cardiovascular;  Laterality: N/A;  . LEFT HEART CATHETERIZATION WITH CORONARY ANGIOGRAM N/A 11/26/2014   Procedure: LEFT HEART CATHETERIZATION WITH CORONARY ANGIOGRAM;  Surgeon: [name], MD;  Location: [facility];  Service: Cardiovascular;  Laterality: N/A;  . NEUROPLASTY / TRANSPOSITION ULNAR NERVE AT ELBOW Right ~ 2012  . PERCUTANEOUS CORONARY ROTOBLATOR INTERVENTION (PCI-R)  11/20/2014   Procedure: PERCUTANEOUS CORONARY ROTOBLATOR INTERVENTION (PCI-R);  Surgeon: [name], MD;  Location: [facility];  Service: Cardiovascular;;  . SHOULDER ARTHROSCOPY Left ~ 2011  . TEE WITHOUT CARDIOVERSION N/A 01/09/2016   Procedure: TRANSESOPHAGEAL ECHOCARDIOGRAM (TEE);  Surgeon: Rexene Alberts, MD;  Location: [facility];  Service: Open Heart Surgery;  Laterality: N/A;    Social History:  Social History   Socioeconomic History  . Marital status: Married    Spouse name: [name]  . Number of children: 3  . Years of education: 84 th   . Highest education level: Not on file  Occupational History  . Occupation: Unemployed  Social Needs  . Financial resource strain: Not on file  . Food insecurity    Worry: Not on file    Inability: Not on file  . Transportation needs    Medical: Not on file    Non-medical: Not on file  Tobacco Use  . Smoking status: Never Smoker  . Smokeless tobacco: Never Used  Substance and Sexual Activity  . Alcohol use: No  . Drug use: No  . Sexual activity: Yes  Lifestyle  . Physical activity    Days per week: Not on file    Minutes per session: Not on file  . Stress: Not on file  Relationships  . Social Herbalist on  phone: Not on file    Gets together: Not on file    Attends religious service: Not on file    Active member of club or organization: Not on file    Attends meetings of clubs or organizations: Not on file    Relationship status: Not on file  . Intimate partner violence    Fear of current or ex partner: Not on file    Emotionally abused: Not on file    Physically abused: Not on file    Forced sexual activity:  Not on file  Other Topics Concern  . Not on file  Social History Narrative   Patient lives at home with wife [name].   Patient works at [employer], Engineer, petroleum.   Patient has a 12 th grade education.    Patient has 3 children.        Family History:  Family History  Problem Relation Age of Onset  . Stroke Mother   . Coronary artery disease Father 60  . Asthma Sister   . Multiple sclerosis Brother   . Heart disease Paternal Uncle   . Stroke Maternal Grandmother   . Heart attack Paternal Grandmother   . Cancer Paternal Grandfather   . Asthma Sister   . Seizures Son   . Migraines Son   . Autism Son   . Migraines Son     Medications:   Current Outpatient Medications on File Prior to Visit  Medication Sig Dispense Refill  . albuterol (PROVENTIL HFA;VENTOLIN HFA) 108 (90 BASE) MCG/ACT inhaler Inhale 2 puffs into the lungs every 6 (six) hours as needed for wheezing or shortness of breath.    [name] amLODipine (NORVASC) 5 MG tablet Take 5 mg by mouth at bedtime.  4  . aspirin EC 81 MG tablet Take 81 mg by mouth at bedtime.     . citalopram (CELEXA) 20 MG tablet Take 20 mg by mouth at bedtime.     . diazepam (VALIUM) 5 MG tablet Take 5 mg by mouth every 6 (six) hours as needed for anxiety.    . ergocalciferol (VITAMIN D2) 1.25 MG (50000 UT) capsule Take 1 capsule (50,000 Units total) by mouth once a week. 20 capsule 1  . fluticasone (FLONASE) 50 MCG/ACT nasal spray Place 1 spray into the nose 2 (two) times daily.     . Fluticasone-Salmeterol (ADVAIR) 250-50  MCG/DOSE AEPB Inhale 1 puff into the lungs every 12 (twelve) hours.    . gabapentin (NEURONTIN) 300 MG capsule Take 300 mg by mouth 3 (three) times daily.  2  . insulin aspart (NOVOLOG FLEXPEN) 100 UNIT/ML FlexPen Inject 2-30 Units into the skin 3 (three) times daily as needed for high blood sugar (CBG >150). Per sliding scale written down at home     . isosorbide mononitrate (IMDUR) 30 MG 24 hr tablet Take 30 mg by mouth at bedtime.  4  . Melatonin 5 MG TABS Take 10 mg by mouth at bedtime.     . metFORMIN (GLUCOPHAGE) 1000 MG tablet Take 1 tablet (1,000 mg total) by mouth 2 (two) times daily. For the next 2 days as you did receive IV contrast, then resume on 11/26/2018 60 tablet 11  . metoprolol tartrate (LOPRESSOR) 50 MG tablet Take 25 mg by mouth 2 (two) times daily.     . nitroGLYCERIN (NITROSTAT) 0.4 MG SL tablet Place 1 tablet (0.4 mg total) under the tongue every 5 (five) minutes x 3 doses as needed for chest pain (if no relief after 3rd dose, proceed to the ED for an evaluation or call 911). 25 tablet 3  . omeprazole (PRILOSEC) 20 MG capsule Take 20 mg by mouth 2 (two) times daily before a meal.    . pravastatin (PRAVACHOL) 20 MG tablet Take 1 tablet (20 mg total) by mouth daily. 10 tablet 0  . topiramate (TOPAMAX) 25 MG capsule Take 50 mg by mouth 2 (two) times daily.     . TRESIBA FLEXTOUCH 100 UNIT/ML SOPN FlexTouch Pen Inject 70 Units into the skin at  bedtime.   5   No current facility-administered medications on file prior to visit.     Allergies:   Allergies  Allergen Reactions  . Divalproex Sodium Other (See Comments)    Causes anger  . Imdur [Isosorbide Dinitrate] Other (See Comments)    Severe headache (trying lower dose 07/05/18)  . Lipitor [Atorvastatin Calcium]     cramps  . Statins Other (See Comments)    Muscle aches and cramps  . Tramadol Other (See Comments)    Chest pain   . Tricor [Fenofibrate] Other (See Comments)    Leg cramps  . Valproic Acid Other (See  Comments)    "anger"     Physical Exam  Vitals:   06/27/19 0845  BP: 127/77  Pulse: 69  Temp: 97.7 F (36.5 C)  Weight: 235 lb 6.4 oz (106.8 kg)  Height: 5' 9"  (1.753 m)   Body mass index is 34.76 kg/m. No exam data present  General: well developed, well nourished, pleasant middle-aged Caucasian male, seated, in no evident distress Head: head normocephalic and atraumatic.   Neck: supple with no carotid or supraclavicular bruits Cardiovascular: regular rate and rhythm, no murmurs Musculoskeletal: no deformity; limited ROM right hip due to pain Skin:  no rash/petichiae Vascular:  Normal pulses all extremities  Neurologic Exam Mental Status: Awake and fully alert. Oriented to place and time. Recent and remote memory intact. Attention span, concentration and fund of knowledge appropriate. Mood and affect appropriate.  Cranial Nerves: Fundoscopic exam reveals sharp disc margins. Pupils equal, briskly reactive to light. Extraocular movements vertical nystagmus with reported lightheadedness sensation with horizontal and vertical eye movements. Visual fields full to confrontation. Hearing intact. Facial sensation intact. Face, tongue, palate moves normally and symmetrically.  Motor: Normal bulk and tone. Normal strength in all tested extremity muscles.  Difficulty assessing right hip flexor due to pain Sensory.:  Lack of light touch and vibratory sensation bilateral lower extremities distally (chronic) Coordination: Rapid alternating movements normal in all extremities. Finger-to-nose and heel-to-shin performed accurately bilaterally.  Dysmetria versus ataxia negative cogwheel rigidity Gait and Station: Arises from chair with mild difficulty. Stance is normal. Gait demonstrates short step length with ataxic gait and instability.  No evidence of pill-rolling tremors.  Romberg positive.  Unable to perform tandem gait. Reflexes: 1+ and symmetric. Toes downgoing.        Diagnostic Data  (Labs, Imaging, Testing)  CTA head/neck (OSH)  calcified atherosclerotic disease at the carotid bifurcation w/o hemodynamically significant stenosis. No dissection or aneurysm. Intracranial arteries and veins are patent and unremarkable.   CT Head (OSH)  no acute intracranial abnormality.   MR BRAIN WO CONTRAST MR MRA HEAD WO CONTRAST 07/09/2018 IMPRESSION: Negative for acute infarct or hemorrhage. Mild chronic microvascular ischemia in the white matter Negative MRA head   Transthoracic Echocardiogram  07/09/2018 Study Conclusions - Left ventricle: The cavity size was normal. Wall thickness was increased in a pattern of mild LVH. Systolic function was normal. The estimated ejection fraction was in the range of 60% to 65%. Wall motion was normal; there were no regional wall motion abnormalities. Left ventricular diastolic function parameters were normal for the patient&'s age. - Aortic valve: Mildly calcified annulus. Trileaflet; mildly calcified leaflets. There was mild regurgitation. - Mitral valve: There was trivial regurgitation. - Atrial septum: No defect or patent foramen ovale was identified. - Tricuspid valve: There was trivial regurgitation. - Pericardium, extracardiac: There was no pericardial effusion.  Bilateral Carotid Dopplers  07/10/2018 Final Interpretation: Right Carotid:  Velocities in the right ICA are consistent with a 1-39% stenosis. Left Carotid: Velocities in the left ICA are consistent with a 1-39% stenosis. Vertebrals: Bilateral vertebral arteries demonstrate antegrade flow. Right vertebral artery demonstrates high resistant flow.  EEG 07/10/2018 Impression The EEG isabnormalare findings are suggestive of mild generalized cerebral dysfunction.Epileptiform features were not seen during this recording.   ASSESSMENT: ROCKLIN SODERQUIST is a 63 y.o. year old male here with strokelike episode S/P IV TPA on 07/08/2018. Vascular risk factors  include HTN, HLD, DM, OSA, CAD and CHF.  Residual deficit of blurred vision/diplopia.  Reports 1 month history of worsening lightheadedness/vertigo associated with worsening vision with examination demonstrating vertical nystagmus that produces lightheaded sensation   PLAN: -Recommend undergoing MRI brain w w/o contrast to rule out potential abnormalities causing deficits -Depending on imaging results, may refer to physical therapy or neuro-ophthalmology -Order placed for rolling walker and shower chair due to safety concerns with reoccurring falls -Discussion with wife and patient regarding increasing supervision at home with safety concerns -Continue aspirin 81 mg daily  and pravastatin and Zetia for secondary stroke prevention -F/u with PCP regarding your HLD, HTN and DM management -Review of referral placed to [place] sleep clinic, attempted to obtain signed form regarding release of information to obtain prior sleep study results as he was not eligible for repeat sleep study or to obtain new machine at that time.  Patient unfortunately did not send back to the office despite multiple attempts to obtain.  Referral replaced to [place] sleep clinic -continue to monitor BP at home -advised to continue to stay active and maintain a healthy diet -Maintain strict control of hypertension with blood pressure goal below 130/90, diabetes with hemoglobin A1c goal below 6.5% and cholesterol with LDL cholesterol (bad cholesterol) goal below 70 mg/dL. I also advised the patient to eat a healthy diet with plenty of whole grains, cereals, fruits and vegetables, exercise regularly and maintain ideal body weight.  Follow up in 2 months or call earlier if needed   Greater than 50% of time during this 35 minute visit was spent on counseling, discussion regarding new onset symptoms, reviewing risk factor management of HTN, HLD, DM, CAD, CHF and OSA, planning of further management, discussion with patient and family and  coordination of care    Frann Rider, [facility]  [facility] [address] [place], [place] [address]  Phone [phone] Fax [phone] Note: This document was prepared with digital dictation and possible smart phrase technology. Any transcriptional errors that result from this process are unintentional.

## 2019-06-27 NOTE — Patient Instructions (Signed)
Order placed for MRI brain to assess for any abnormalities that could be causing your symptoms  Orders placed for rolling walker and shower chair that can be obtained at any medical supply company  Recommend supervision while at home for safety concerns  May consider therapy depending on results of imaging    Follow up in 2 months or call earlier if needed

## 2019-06-27 NOTE — Addendum Note (Signed)
Addended by: Brandon Melnick on: 06/27/2019 02:05 PM   Modules accepted: Orders

## 2019-06-27 NOTE — Telephone Encounter (Signed)
Medicare no auth. Patient is scheduled at GI for 07/25/19.

## 2019-06-28 NOTE — Progress Notes (Signed)
I agree with the above plan 

## 2019-07-03 NOTE — Telephone Encounter (Signed)
Pt's wife called wanting to know the update on this prescription for the rollator with seat. Please advise.

## 2019-07-04 NOTE — Telephone Encounter (Signed)
As discussed with patient during visit, it was recommended for him to use walker due to gait difficulty with falls for which she was provided a prescription form.  I would not recommend a Rollator for this patient due to safety concerns with lack of support he may need.  Referral signed for sleep apnea evaluation.  MRI will be obtained 07/25/2019.  We will determine after imaging completed if therapy indicated based on results.

## 2019-07-04 NOTE — Telephone Encounter (Signed)
I called pt and relayed per JM/NP that she did not recommend at this time due to safety issues (gait/falls) that she wanted him to have a wheeled walker w/seat.  Once imaging done this could change.  (he would hold off for now, since [facility] would not pay for both).  Referral for sleep was ordered.  He verbalized understanding.

## 2019-07-04 NOTE — Addendum Note (Signed)
Addended by: Mal Misty on: 07/04/2019 12:44 PM   Modules accepted: Orders

## 2019-07-04 NOTE — Telephone Encounter (Signed)
Patient's wife called to check the status of patients rolling walker. I advised her of the message that was relayed to the patient earlier today and she would like a call to discuss. She can be reached at [phone]. She would need a prescription resent for the regular walker sent to [facility]

## 2019-07-05 NOTE — Telephone Encounter (Signed)
I tried to call wife, her VM not set up.  Will go ahead and resend DME for walker.

## 2019-07-05 NOTE — Addendum Note (Signed)
Addended by: Brandon Melnick on: 07/05/2019 09:35 AM   Modules accepted: Orders

## 2019-07-05 NOTE — Telephone Encounter (Signed)
Order placed and to refax to [employer] [facility] [phone].

## 2019-07-06 ENCOUNTER — Other Ambulatory Visit: Payer: Self-pay | Admitting: *Deleted

## 2019-07-06 DIAGNOSIS — H814 Vertigo of central origin: Secondary | ICD-10-CM

## 2019-07-06 DIAGNOSIS — G3281 Cerebellar ataxia in diseases classified elsewhere: Secondary | ICD-10-CM

## 2019-07-06 NOTE — Telephone Encounter (Signed)
I called and spoke to wife.  She stated that she would like the original order that JM/NP gave them when they left (rolling walker/ no seat) to be faxed to [facility] [facility].  She understands relating to ataxic gait, balance and falls.  She just wanted seat for pt to be able to rest and due to potential for falls, will fall back to original order that was put in by JM/NP. Fax confirmation [phone] [employer].

## 2019-07-10 NOTE — Progress Notes (Signed)
Fax confirmation for [employer] [phone] received this am.  [phone].

## 2019-07-18 ENCOUNTER — Encounter: Payer: Self-pay | Admitting: Neurology

## 2019-07-18 ENCOUNTER — Institutional Professional Consult (permissible substitution): Payer: Medicare Other | Admitting: Neurology

## 2019-07-18 ENCOUNTER — Telehealth: Payer: Self-pay | Admitting: Neurology

## 2019-07-18 NOTE — Telephone Encounter (Signed)
Pt no showed 06/18/2019 appt with Dr. Rexene Alberts.

## 2019-07-18 NOTE — Telephone Encounter (Signed)
Patient called and rescheduled his appt to 07/26/19 due to he is at [facility] with his son.

## 2019-07-25 ENCOUNTER — Other Ambulatory Visit: Payer: Self-pay

## 2019-07-25 ENCOUNTER — Ambulatory Visit
Admission: RE | Admit: 2019-07-25 | Discharge: 2019-07-25 | Disposition: A | Discharge status: Home / Self Care | Payer: Medicare Other | Source: Ambulatory Visit | Attending: Adult Health | Admitting: Adult Health

## 2019-07-25 ENCOUNTER — Institutional Professional Consult (permissible substitution): Payer: Medicare Other | Admitting: Neurology

## 2019-07-25 ENCOUNTER — Telehealth: Payer: Self-pay

## 2019-07-25 DIAGNOSIS — G3281 Cerebellar ataxia in diseases classified elsewhere: Secondary | ICD-10-CM | POA: Diagnosis not present

## 2019-07-25 MED ORDER — GADOBENATE DIMEGLUMINE 529 MG/ML IV SOLN
20.0000 mL | Freq: Once | INTRAVENOUS | Status: AC | PRN
  Administered 2019-07-25: 20 mL via INTRAVENOUS

## 2019-07-25 NOTE — Telephone Encounter (Signed)
Spoke with patient to make sure he made it home safely and to see how he is doing after his adverse reaction to Gadolinium with his MRI this morning.  He stated he made it home with no problem and that he still is feeling a little nauseated.  He has been lying down and taking clear liquids as tolerated.

## 2019-07-26 ENCOUNTER — Telehealth: Payer: Self-pay

## 2019-07-26 ENCOUNTER — Institutional Professional Consult (permissible substitution): Payer: Medicare Other | Admitting: Neurology

## 2019-07-26 ENCOUNTER — Telehealth: Payer: Self-pay | Admitting: Neurology

## 2019-07-26 NOTE — Telephone Encounter (Signed)
MRI of brain on Sept 15th showed no significant abnormalities. He missed his appt on Sept 16th 11am, he could not find the key.  He was noted by his wife that he is very sleepy. He has been sleeping all day.  I was able to talk with patient, he use insulin, accucheck was 165.  He is able to answer questions.  I advise his wife if he is not doing well, may take him to ED today, next appt with [facility] with Dr. Rexene Alberts on Sept 24th 130pm

## 2019-07-26 NOTE — Telephone Encounter (Signed)
Pt did not show for their appt with Dr. Athar today.  

## 2019-07-27 ENCOUNTER — Encounter: Payer: Self-pay | Admitting: *Deleted

## 2019-07-27 ENCOUNTER — Telehealth: Payer: Self-pay | Admitting: *Deleted

## 2019-07-27 ENCOUNTER — Telehealth: Payer: Self-pay | Admitting: Adult Health

## 2019-07-27 ENCOUNTER — Telehealth: Payer: Self-pay | Admitting: Nurse Practitioner

## 2019-07-27 NOTE — Telephone Encounter (Signed)
I attempted to call back and VM not set up.

## 2019-07-27 NOTE — Telephone Encounter (Signed)
Pc from pt's wife regarding pt's MRI on 9/15. She reports pt has had weakness in his legs, been extremely forgetful and now has redness and "looks like he has a tan" on his neck, face and head. Denies itching, rash or raised bumps.  She reports giving 37m of Benadryl yesterday with no relief of symptoms. Spoke with Dr. DNelia Shi he recommended following up with neurologist for his neurological concerns and to speak with his primary care doctor for further evaluation of the redness in his face and neck. Pt's wife verbalized understanding.

## 2019-07-27 NOTE — Telephone Encounter (Signed)
Pt's wife called stating that the pt had a medicine reaction and she states that her friend told her it was because he did not stop his medications before the test. Pt's wife Stanton Kidney, on [place] would like to discuss this with RN. Please advise.

## 2019-07-27 NOTE — Telephone Encounter (Signed)
Metformin is typically recommended to be held depending on renal function with contrast imaging.  He underwent imaging without contrast.  If they are referring to symptom of lethargy, this is likely from his MRI it may be due to other reasons and should follow-up with PCP

## 2019-07-27 NOTE — Telephone Encounter (Signed)
Attempted to call both # to relay MRI results to pt and was not able to LM.  I mailed copy and result note to pt address.

## 2019-07-27 NOTE — Telephone Encounter (Signed)
-----   Message from Frann Rider, NP sent at 07/26/2019 12:31 PM EDT ----- Please advise patient that his recent MRI did not show any abnormalities.

## 2019-07-27 NOTE — Telephone Encounter (Signed)
Pt's wife returned call. Please call back when available.

## 2019-07-27 NOTE — Telephone Encounter (Signed)
Spoke to wife.  Pt still with lethargy, (had reaction to gadolium with contrast) imaging only done without contrast.  She spoke to NP friend and she states that he was on metformin and this was an issue ??  I relayed to call GI and speak with them about reaction.    Since MRI no acute abnormalities did you want to proceed with other testing or therapies.

## 2019-07-31 ENCOUNTER — Encounter: Payer: Self-pay | Admitting: Neurology

## 2019-07-31 ENCOUNTER — Other Ambulatory Visit: Payer: Self-pay | Admitting: Adult Health

## 2019-07-31 DIAGNOSIS — R260 Ataxic gait: Secondary | ICD-10-CM

## 2019-07-31 DIAGNOSIS — R2689 Other abnormalities of gait and mobility: Secondary | ICD-10-CM

## 2019-07-31 NOTE — Telephone Encounter (Signed)
Correction regarding prior statement.  Order placed for MRI w/wo contrast to assess for acute stroke, lesions or abnormality causing vertigo, balance difficulties, gait ataxia and nystagmus.  Request contrast reaction be noted in patient chart.  In regards to metformin, this is typically held in patients with decreased renal function prior to receiving contrast.  Patient does not have history of impaired renal function with recent creatinine satisfactory.  In regards to ongoing lethargy, it is highly encouraged for him to to be evaluated for potentially undergoing sleep evaluation which has been rescheduled for 08/03/2019 as he was a no-show for 2 prior scheduled visits.  Orders for home health physical therapy will be placed

## 2019-07-31 NOTE — Telephone Encounter (Signed)
Consulted JM/NP and she relayed that ok to do PT if pt ok to do.  Has Sleep consult this week on 08-03-19.

## 2019-07-31 NOTE — Telephone Encounter (Signed)
I spoke to wife of pt. Relayed that per JM/NP since MRI brain, no contrast not showing any acute abnormalities would recommend PT and encourage keeping appt for sleep consult on Thursday 08-03-19.  He is sleeping a lot.  I relayed could be sleep related issues, depression?  (she did not this so).  She would like HHPT.

## 2019-08-03 ENCOUNTER — Encounter: Payer: Self-pay | Admitting: Neurology

## 2019-08-03 ENCOUNTER — Ambulatory Visit (INDEPENDENT_AMBULATORY_CARE_PROVIDER_SITE_OTHER): Payer: Medicare Other | Admitting: Neurology

## 2019-08-03 ENCOUNTER — Other Ambulatory Visit: Payer: Self-pay

## 2019-08-03 VITALS — BP 136/79 | HR 65 | Ht 71.0 in | Wt 234.0 lb

## 2019-08-03 DIAGNOSIS — I6330 Cerebral infarction due to thrombosis of unspecified cerebral artery: Secondary | ICD-10-CM | POA: Diagnosis not present

## 2019-08-03 DIAGNOSIS — I639 Cerebral infarction, unspecified: Secondary | ICD-10-CM

## 2019-08-03 DIAGNOSIS — G4719 Other hypersomnia: Secondary | ICD-10-CM | POA: Diagnosis not present

## 2019-08-03 DIAGNOSIS — E669 Obesity, unspecified: Secondary | ICD-10-CM

## 2019-08-03 DIAGNOSIS — G4733 Obstructive sleep apnea (adult) (pediatric): Secondary | ICD-10-CM | POA: Diagnosis not present

## 2019-08-03 DIAGNOSIS — R2990 Unspecified symptoms and signs involving the nervous system: Secondary | ICD-10-CM

## 2019-08-03 NOTE — Patient Instructions (Signed)
Thank you for choosing [facility] for your sleep related care! It was nice to meet you today! I appreciate that you entrust me with your sleep related healthcare concerns. I hope, I was able to address at least some of your concerns today, and that I can help you feel reassured and also get better.    Here is what we discussed today and what we came up with as our plan for you:    Based on your symptoms and your exam I believe you are still at risk for obstructive sleep apnea and would benefit from re-evaluation as it has been several years and you need new supplies and updated machine. Therefore, I think we should proceed with a sleep study to determine how severe your sleep apnea is. If you have more than mild OSA, I want you to consider ongoing treatment with CPAP. Please remember, the risks and ramifications of moderate to severe obstructive sleep apnea or OSA are: Cardiovascular disease, including congestive heart failure, stroke, difficult to control hypertension, arrhythmias, and even type 2 diabetes has been linked to untreated OSA. Sleep apnea causes disruption of sleep and sleep deprivation in most cases, which, in turn, can cause recurrent headaches, problems with memory, mood, concentration, focus, and vigilance. Most people with untreated sleep apnea report excessive daytime sleepiness, which can affect their ability to drive. Please do not drive if you feel sleepy.   I will likely see you back after your sleep study to go over the test results and where to go from there. We will call you after your sleep study to advise about the results (most likely, you will hear from [facility] [place], my nurse) and to set up an appointment at the time, as necessary.    Our sleep lab administrative assistant will call you to schedule your sleep study. If you don't hear back from her by about 2 weeks from now, please feel free to call her at [phone]. You can leave a message with your phone  number and concerns, if you get the voicemail box. She will call back as soon as possible.

## 2019-08-03 NOTE — Progress Notes (Signed)
Subjective:    Patient ID: [name] [name] is a 63 y.o. male.  HPI     Star Age, MD, PhD [facility] [address] [place], [place] [address]  Dear Janett Billow, I saw your patient, [name] [name], upon your kind request on Monday sleep clinic today for initial consultation of his sleep disorder, in particular, evaluation of his obstructive sleep apnea.  The patient is unaccompanied today, but his wife was on his speaker phone for portions of the visit.  Of note, he missed an appointment on 07/26/2019. As you know, [name] [name] is a 63 year old right-handed gentleman with an underlying history of complex medical history of degenerative lumbar disc disease, gout, reflux disease, cataracts, asthma, arthritis, anxiety, thrombocytopenia, hearing loss, hypertension, diabetes, history of TIA-like symptoms, hyperlipidemia, heart disease with history of MI and status post CABG, and obesity, who was previously diagnosed with obstructive sleep apnea and placed on CPAP therapy.  Prior sleep study results are not available for my review today.  Sleep study testing was over 5 years ago.  His CPAP machine has not been working properly and he has not used it in over a year.  She reports that he has not used his CPAP in about 2 years.  Sleep study testing was several years ago in [place].  He used a nasal mask, he reports that his pressure was 11 cm.  He reports feeling sleepy during the day. I reviewed your office note from 06/27/2019.  His Epworth sleepiness score is 14 out of 24, fatigue severity score is 43 out of 63.  He is retired.  He worked for [employer] at [employer]  He is a non-smoker and does not drink alcohol and does not drink caffeine daily.  He lives with his wife and 2 of his 3 sons.  He reports that his 57 year old father has sleep apnea and uses a CPAP machine.  His bedtime is around 10 or 11, rise time is between 10 and 12.  He has nocturia about once per  average night and denies morning headaches.  His weight has been fairly stable.  He would be willing to get retested for sleep apnea and consider CPAP therapy again.  His Past Medical History Is Significant For: Past Medical History:  Diagnosis Date  . Anxiety   . Arthritis   . Asthma   . Cataract    Right eye  . Chronic lower back pain   . Coronary atherosclerosis of native coronary artery    a. LAD & OM stenting;  b. 2007 Cath: nonobs dzs, patent stents;  c. 07/2012 neg MV, EF 61%.  d. 11/20/14: [place] s/p  PCI w/ DES to pLAD and DES to OM1   . Essential hypertension, benign   . GERD (gastroesophageal reflux disease)   . Hearing loss of left ear   . History of gout   . History of hiatal hernia   . Hypercholesteremia   . Lumbar herniated disc   . MI, old   . Migraine   . OSA on CPAP   . 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 Thrombocytopenia ([place])   . TIA (transient ischemic attack) ~ 2010  . Type 2 diabetes mellitus ([place])     His Past Surgical History Is Significant For: Past Surgical History:  Procedure Laterality Date  . ANTERIOR CERVICAL DECOMP/DISCECTOMY FUSION  1998   "C3-4"  . CARDIAC CATHETERIZATION  "several"  . CARDIAC CATHETERIZATION N/A 12/30/2015   Procedure: Left Heart Cath and Coronary Angiography;  Surgeon: [name], MD;  Location: [facility];  Service: Cardiovascular;  Laterality: N/A;  . CARPAL TUNNEL RELEASE Bilateral 2005  . CHOLECYSTECTOMY N/A 11/28/2018   Procedure: LAPAROSCOPIC CHOLECYSTECTOMY;  Surgeon: [name], MD;  Location: [facility];  Service: General;  Laterality: N/A;  . COLONOSCOPY    . CORONARY ANGIOPLASTY    . CORONARY ANGIOPLASTY WITH STENT PLACEMENT  2002; 2003; 11/20/2014   "I have 4 stents after today" (11/20/2014)  . CORONARY ARTERY BYPASS GRAFT N/A 01/09/2016   Procedure: CORONARY ARTERY BYPASS GRAFTING (CABG) X 3 UTILIZING RIGHT AND LEFT INTERNAL  MAMMARY ARTERY AND ENDOSCOPICALLY HARVESTED SAPHENEOUS VEIN.;  Surgeon: Rexene Alberts, MD;  Location: [facility];  Service: Open Heart Surgery;  Laterality: N/A;  . ESOPHAGOGASTRODUODENOSCOPY    . KNEE SURGERY Left 02/2012   "scraped; open"  . LEFT HEART CATH AND CORS/GRAFTS ANGIOGRAPHY N/A 08/09/2017   Procedure: LEFT HEART CATH AND CORS/GRAFTS ANGIOGRAPHY;  Surgeon: [name], MD;  Location: [facility];  Service: Cardiovascular;  Laterality: N/A;  . LEFT HEART CATH AND CORS/GRAFTS ANGIOGRAPHY N/A 11/22/2018   Procedure: LEFT HEART CATH AND CORS/GRAFTS ANGIOGRAPHY;  Surgeon: [name], [name], MD;  Location: [facility];  Service: Cardiovascular;  Laterality: N/A;  . LEFT HEART CATHETERIZATION WITH CORONARY ANGIOGRAM N/A 07/20/2012   Procedure: LEFT HEART CATHETERIZATION WITH CORONARY ANGIOGRAM;  Surgeon: [name], MD;  Location: [facility];  Service: Cardiovascular;  Laterality: N/A;  . LEFT HEART CATHETERIZATION WITH CORONARY ANGIOGRAM N/A 11/20/2014   Procedure: LEFT HEART CATHETERIZATION WITH CORONARY ANGIOGRAM;  Surgeon: [name], MD;  Location: [facility];  Service: Cardiovascular;  Laterality: N/A;  . LEFT HEART CATHETERIZATION WITH CORONARY ANGIOGRAM N/A 11/26/2014   Procedure: LEFT HEART CATHETERIZATION WITH CORONARY ANGIOGRAM;  Surgeon: [name], MD;  Location: [facility];  Service: Cardiovascular;  Laterality: N/A;  . NEUROPLASTY / TRANSPOSITION ULNAR NERVE AT ELBOW Right ~ 2012  . PERCUTANEOUS CORONARY ROTOBLATOR INTERVENTION (PCI-R)  11/20/2014   Procedure: PERCUTANEOUS CORONARY ROTOBLATOR INTERVENTION (PCI-R);  Surgeon: [name], MD;  Location: [name];  Service: Cardiovascular;;  . SHOULDER ARTHROSCOPY Left ~ 2011  . TEE WITHOUT CARDIOVERSION N/A 01/09/2016   Procedure: TRANSESOPHAGEAL ECHOCARDIOGRAM (TEE);  Surgeon: Rexene Alberts, MD;  Location: [facility];  Service: Open Heart Surgery;  Laterality: N/A;    His Family History Is Significant  For: Family History  Problem Relation Age of Onset  . Stroke Mother   . Coronary artery disease Father 65  . Asthma Sister   . Multiple sclerosis Brother   . Heart disease Paternal Uncle   . Stroke Maternal Grandmother   . Heart attack Paternal Grandmother   . Cancer Paternal Grandfather   . Asthma Sister   . Seizures Son   . Migraines Son   . Autism Son   . Migraines Son     His Social History Is Significant For: Social History   Socioeconomic History  . Marital status: Married    Spouse name: [name]  . Number of children: 3  . Years of education: 43 th   . Highest education level: Not on file  Occupational History  . Occupation: Unemployed  Social Needs  . Financial resource strain: Not on file  . Food insecurity    Worry: Not  on file    Inability: Not on file  . Transportation needs    Medical: Not on file    Non-medical: Not on file  Tobacco Use  . Smoking status: Never Smoker  . Smokeless tobacco: Never Used  Substance and Sexual Activity  . Alcohol use: No  . Drug use: No  . Sexual activity: Yes  Lifestyle  . Physical activity    Days per week: Not on file    Minutes per session: Not on file  . Stress: Not on file  Relationships  . Social Herbalist on phone: Not on file    Gets together: Not on file    Attends religious service: Not on file    Active member of club or organization: Not on file    Attends meetings of clubs or organizations: Not on file    Relationship status: Not on file  Other Topics Concern  . Not on file  Social History Narrative   Patient lives at home with wife [name].   Patient works at [employer], Engineer, petroleum.   Patient has a 12 th grade education.    Patient has 3 children.        His Allergies Are:  Allergies  Allergen Reactions  . Divalproex Sodium Other (See Comments)    Causes anger  . Imdur [Isosorbide Dinitrate] Other (See Comments)    Severe headache (trying lower dose 07/05/18)   . Statins Other (See Comments)    Muscle aches and cramps  . Tramadol Other (See Comments)    Chest pain   . Valproic Acid Other (See Comments)    "anger"  . Gadolinium Derivatives Nausea And Vomiting    07/25/19 Pt vomited immediately after IV gad. Denies itching, dyspnea.  (Adverse, not allergic, reaction  . Tricor [Fenofibrate] Other (See Comments)    Leg cramps  :   His Current Medications Are:  Outpatient Encounter Medications as of 08/03/2019  Medication Sig  . albuterol (PROVENTIL HFA;VENTOLIN HFA) 108 (90 BASE) MCG/ACT inhaler Inhale 2 puffs into the lungs every 6 (six) hours as needed for wheezing or shortness of breath.  [name] amLODipine (NORVASC) 5 MG tablet Take 5 mg by mouth at bedtime.  [name] aspirin EC 81 MG tablet Take 81 mg by mouth at bedtime.   . citalopram (CELEXA) 20 MG tablet Take 20 mg by mouth at bedtime.   . diazepam (VALIUM) 5 MG tablet Take 5 mg by mouth every 6 (six) hours as needed for anxiety.  . ergocalciferol (VITAMIN D2) 1.25 MG (50000 UT) capsule Take 1 capsule (50,000 Units total) by mouth once a week.  . fluticasone (FLONASE) 50 MCG/ACT nasal spray Place 1 spray into the nose 2 (two) times daily.   . Fluticasone-Salmeterol (ADVAIR) 250-50 MCG/DOSE AEPB Inhale 1 puff into the lungs every 12 (twelve) hours.  . gabapentin (NEURONTIN) 300 MG capsule Take 300 mg by mouth 3 (three) times daily.  . insulin aspart (NOVOLOG FLEXPEN) 100 UNIT/ML FlexPen Inject 2-30 Units into the skin 3 (three) times daily as needed for high blood sugar (CBG >150). Per sliding scale written down at home   . isosorbide mononitrate (IMDUR) 30 MG 24 hr tablet Take 30 mg by mouth at bedtime.  . Melatonin 5 MG TABS Take 10 mg by mouth at bedtime.   . metFORMIN (GLUCOPHAGE) 1000 MG tablet Take 1 tablet (1,000 mg total) by mouth 2 (two) times daily. For the next 2 days as you did receive  IV contrast, then resume on 11/26/2018  . metoprolol tartrate (LOPRESSOR) 50 MG tablet Take 25 mg by mouth 2  (two) times daily.   [name] omeprazole (PRILOSEC) 20 MG capsule Take 20 mg by mouth 2 (two) times daily before a meal.  . pravastatin (PRAVACHOL) 20 MG tablet Take 1 tablet (20 mg total) by mouth daily.  [name] topiramate (TOPAMAX) 25 MG capsule Take 50 mg by mouth 2 (two) times daily.   [name] FLEXTOUCH 100 UNIT/ML SOPN FlexTouch Pen Inject 70 Units into the skin at bedtime.   . nitroGLYCERIN (NITROSTAT) 0.4 MG SL tablet Place 1 tablet (0.4 mg total) under the tongue every 5 (five) minutes x 3 doses as needed for chest pain (if no relief after 3rd dose, proceed to the ED for an evaluation or call 911).   No facility-administered encounter medications on file as of 08/03/2019.   :  Review of Systems:  Out of a complete 14 point review of systems, all are reviewed and negative with the exception of these symptoms as listed below: Review of Systems  Neurological:       Pt presents today to discuss his sleep. Pt reports that he has had a sleep study in the past but is unsure when. He has a cpap at home but he reports that it is broken and he has not been using it. He did not bring it today. He is unsure how old it is . Pt does not know which DME he uses. Pt does endorse snoring.  Epworth Sleepiness Scale 0= would never doze 1= slight chance of dozing 2= moderate chance of dozing 3= high chance of dozing  Sitting and reading: 3 Watching TV: 2 Sitting inactive in a public place (ex. Theater or meeting): 2 As a passenger in a car for an hour without a break: 1 Lying down to rest in the afternoon: 3 Sitting and talking to someone: 0 Sitting quietly after lunch (no alcohol): 3 In a car, while stopped in traffic: 0 Total: 14     Objective:  Neurological Exam  Physical Exam Physical Examination:   Vitals:   08/03/19 1316  BP: 136/79  Pulse: 65   General Examination: The patient is a very pleasant 63 y.o. male in no acute distress. He appears well-developed and well-nourished and well  groomed.   HEENT: Normocephalic, atraumatic, pupils are equal, round and reactive to light and accommodation. Funduscopic exam is normal with sharp disc margins noted. Extraocular tracking is good without limitation to gaze excursion or nystagmus noted. Normal smooth pursuit is noted. Hearing is grossly intact. Tympanic membranes are clear bilaterally. Face is symmetric with normal facial animation and normal facial sensation. Speech is clear with no dysarthria noted. There is no hypophonia. There is no lip, neck/head, jaw or voice tremor. Neck is supple with full range of passive and active motion. There are no carotid bruits on auscultation. Oropharynx exam reveals: moderate mouth dryness, adequate dental hygiene and mild airway crowding, due to Small airway entry, longer uvula, tonsils are small, 1+, tongue protrudes centrally in palate elevates symmetrically.  Neck circumference is 17-1/4 inches.  Chest: Clear to auscultation without wheezing, rhonchi or crackles noted.  Heart: S1+S2+0, regular and normal without murmurs, rubs or gallops noted.   Abdomen: Soft, non-tender and non-distended with normal bowel sounds appreciated on auscultation.  Extremities: There is no pitting edema in the distal lower extremities bilaterally. Pedal pulses are intact.  Skin: Warm and dry without trophic changes noted.  Musculoskeletal: exam reveals no obvious joint deformities, tenderness or joint swelling or erythema.   Neurologically:  Mental status: The patient is awake, alert and oriented in all 4 spheres. His immediate and remote memory, attention, language skills and fund of knowledge are appropriate. There is no evidence of aphasia, agnosia, apraxia or anomia. Speech is clear with normal prosody and enunciation. Thought process is linear. Mood is normal and affect is normal.  Cranial nerves II - XII are as described above under HEENT exam. In addition: shoulder shrug is normal with equal shoulder height  noted. Motor exam: Normal bulk, strength and tone is noted. There is no drift, tremor or rebound. Romberg is negative. Fine motor skills and coordination: grossly intact.  Cerebellar testing: No dysmetria or intention tremor.  Sensory exam: intact to light touch.  Gait, station and balance: He stands easily. No veering to one side is noted. No leaning to one side is noted. Posture is age-appropriate and stance is narrow based. Gait shows normal stride length and normal pace. No problems turning are noted.   Assessment and Plan:  In summary, [name] [name] is a very pleasant 63 y.o.-year old male with an underlying history of complex medical history of degenerative lumbar disc disease, gout, reflux disease, cataracts, asthma, arthritis, anxiety, thrombocytopenia, hearing loss, hypertension, diabetes, history of TIA-like symptoms, hyperlipidemia, heart disease with history of MI and status post CABG, and obesity, who Presents for evaluation of his prior diagnosis of obstructive sleep apnea.  He has not been using his CPAP for the past 2 years or so.  He would be willing to get retested and restart CPAP therapy.  He reports that he is sure he needs it.  We talked about the diagnosis of sleep apnea, its prognosis and treatment options.  We talked about alternative options to CPAP therapy potentially such as a dental device or surgical options.  He is advised that CPAP therapy is likely going to be his best treatment option.  We will proceed with sleep study testing.  We will call him to schedule his sleep study soon. I answered all his questions today and the patient was in agreement. I plan to see him back after the sleep study is completed and encouraged him to call with any interim questions, concerns, problems or updates.   Thank you very much for allowing me to participate in the care of this nice patient. If I can be of any further assistance to you please do not hesitate to talk to  me.  Sincerely,   Star Age, MD, PhD

## 2019-08-27 ENCOUNTER — Ambulatory Visit (INDEPENDENT_AMBULATORY_CARE_PROVIDER_SITE_OTHER): Payer: Medicare Other | Admitting: Neurology

## 2019-08-27 DIAGNOSIS — G4719 Other hypersomnia: Secondary | ICD-10-CM

## 2019-08-27 DIAGNOSIS — G4733 Obstructive sleep apnea (adult) (pediatric): Secondary | ICD-10-CM

## 2019-08-27 DIAGNOSIS — R2990 Unspecified symptoms and signs involving the nervous system: Secondary | ICD-10-CM

## 2019-08-27 DIAGNOSIS — E669 Obesity, unspecified: Secondary | ICD-10-CM

## 2019-08-27 DIAGNOSIS — G4761 Periodic limb movement disorder: Secondary | ICD-10-CM

## 2019-08-27 DIAGNOSIS — G4720 Circadian rhythm sleep disorder, unspecified type: Secondary | ICD-10-CM

## 2019-08-29 ENCOUNTER — Inpatient Hospital Stay (HOSPITAL_COMMUNITY): Payer: Medicare Other

## 2019-08-30 ENCOUNTER — Encounter: Payer: Self-pay | Admitting: Adult Health

## 2019-08-30 ENCOUNTER — Inpatient Hospital Stay (HOSPITAL_COMMUNITY): Payer: Medicare Other | Attending: Hematology

## 2019-08-30 ENCOUNTER — Other Ambulatory Visit: Payer: Self-pay

## 2019-08-30 ENCOUNTER — Ambulatory Visit (INDEPENDENT_AMBULATORY_CARE_PROVIDER_SITE_OTHER): Payer: Medicare Other | Admitting: Adult Health

## 2019-08-30 VITALS — BP 117/72 | HR 63 | Temp 97.1°F | Ht 69.0 in | Wt 238.2 lb

## 2019-08-30 DIAGNOSIS — D649 Anemia, unspecified: Secondary | ICD-10-CM | POA: Diagnosis not present

## 2019-08-30 DIAGNOSIS — G4733 Obstructive sleep apnea (adult) (pediatric): Secondary | ICD-10-CM

## 2019-08-30 DIAGNOSIS — D696 Thrombocytopenia, unspecified: Secondary | ICD-10-CM | POA: Insufficient documentation

## 2019-08-30 DIAGNOSIS — K7460 Unspecified cirrhosis of liver: Secondary | ICD-10-CM | POA: Diagnosis not present

## 2019-08-30 DIAGNOSIS — H9012 Conductive hearing loss, unilateral, left ear, with unrestricted hearing on the contralateral side: Secondary | ICD-10-CM

## 2019-08-30 DIAGNOSIS — E538 Deficiency of other specified B group vitamins: Secondary | ICD-10-CM | POA: Insufficient documentation

## 2019-08-30 DIAGNOSIS — R42 Dizziness and giddiness: Secondary | ICD-10-CM

## 2019-08-30 DIAGNOSIS — E559 Vitamin D deficiency, unspecified: Secondary | ICD-10-CM | POA: Insufficient documentation

## 2019-08-30 DIAGNOSIS — Z23 Encounter for immunization: Secondary | ICD-10-CM | POA: Insufficient documentation

## 2019-08-30 DIAGNOSIS — R2689 Other abnormalities of gait and mobility: Secondary | ICD-10-CM

## 2019-08-30 DIAGNOSIS — E1100 Type 2 diabetes mellitus with hyperosmolarity without nonketotic hyperglycemic-hyperosmolar coma (NKHHC): Secondary | ICD-10-CM

## 2019-08-30 DIAGNOSIS — H9312 Tinnitus, left ear: Secondary | ICD-10-CM | POA: Diagnosis not present

## 2019-08-30 DIAGNOSIS — I10 Essential (primary) hypertension: Secondary | ICD-10-CM

## 2019-08-30 DIAGNOSIS — E785 Hyperlipidemia, unspecified: Secondary | ICD-10-CM

## 2019-08-30 LAB — CBC WITH DIFFERENTIAL/PLATELET
Abs Immature Granulocytes: 0.02 10*3/uL (ref 0.00–0.07)
Basophils Absolute: 0 10*3/uL (ref 0.0–0.1)
Basophils Relative: 1 %
Eosinophils Absolute: 0.1 10*3/uL (ref 0.0–0.5)
Eosinophils Relative: 3 %
HCT: 46.3 % (ref 39.0–52.0)
Hemoglobin: 16 g/dL (ref 13.0–17.0)
Immature Granulocytes: 1 %
Lymphocytes Relative: 37 %
Lymphs Abs: 1.5 10*3/uL (ref 0.7–4.0)
MCH: 30.7 pg (ref 26.0–34.0)
MCHC: 34.6 g/dL (ref 30.0–36.0)
MCV: 88.7 fL (ref 80.0–100.0)
Monocytes Absolute: 0.3 10*3/uL (ref 0.1–1.0)
Monocytes Relative: 7 %
Neutro Abs: 2.2 10*3/uL (ref 1.7–7.7)
Neutrophils Relative %: 51 %
Platelets: 105 10*3/uL — ABNORMAL LOW (ref 150–400)
RBC: 5.22 MIL/uL (ref 4.22–5.81)
RDW: 13.3 % (ref 11.5–15.5)
WBC: 4.2 10*3/uL (ref 4.0–10.5)
nRBC: 0 % (ref 0.0–0.2)

## 2019-08-30 LAB — COMPREHENSIVE METABOLIC PANEL
ALT: 29 U/L (ref 0–44)
AST: 35 U/L (ref 15–41)
Albumin: 4.1 g/dL (ref 3.5–5.0)
Alkaline Phosphatase: 44 U/L (ref 38–126)
Anion gap: 11 (ref 5–15)
BUN: 21 mg/dL (ref 8–23)
CO2: 24 mmol/L (ref 22–32)
Calcium: 9.7 mg/dL (ref 8.9–10.3)
Chloride: 104 mmol/L (ref 98–111)
Creatinine, Ser: 1.13 mg/dL (ref 0.61–1.24)
GFR calc Af Amer: >60 mL/min (ref >60)
GFR calc non Af Amer: >60 mL/min (ref >60)
Glucose, Bld: 167 mg/dL — ABNORMAL HIGH (ref 70–99)
Potassium: 4 mmol/L (ref 3.5–5.1)
Sodium: 139 mmol/L (ref 135–145)
Total Bilirubin: 0.6 mg/dL (ref 0.3–1.2)
Total Protein: 7.7 g/dL (ref 6.5–8.1)

## 2019-08-30 LAB — LACTATE DEHYDROGENASE: LDH: 138 U/L (ref 98–192)

## 2019-08-30 LAB — IRON AND TIBC
Iron: 134 ug/dL (ref 45–182)
Saturation Ratios: 34 % (ref 17.9–39.5)
TIBC: 393 ug/dL (ref 250–450)
UIBC: 259 ug/dL

## 2019-08-30 LAB — FOLATE: Folate: 9.2 ng/mL (ref >5.9)

## 2019-08-30 LAB — VITAMIN D 25 HYDROXY (VIT D DEFICIENCY, FRACTURES): Vit D, 25-Hydroxy: 65.75 ng/mL (ref 30–100)

## 2019-08-30 LAB — FERRITIN: Ferritin: 68 ng/mL (ref 24–336)

## 2019-08-30 LAB — VITAMIN B12: Vitamin B-12: 808 pg/mL (ref 180–914)

## 2019-08-30 NOTE — Patient Instructions (Addendum)
Your Plan:  Referral placed to be evaluated by ENT specialist - you will be called to schedule initial visit  Referral also placed for vestibular therapy - you will be called to schedule evaluation     Thank you for coming to see [place] at [facility]. I hope we have been able to provide you high quality care today.  You may receive a patient satisfaction survey over the next few weeks. We would appreciate your feedback and comments so that we may continue to improve ourselves and the health of our patients.

## 2019-08-30 NOTE — Progress Notes (Signed)
I agree with the above plan 

## 2019-08-30 NOTE — Progress Notes (Signed)
[facility] [address] [place]. [place] [address] [phone]       OFFICE FOLLOW UP NOTE  [name] Date of Birth:  31-Jan-1956 Medical Record Number:  [id number]   Reason for Referral:  stroke follow up  CHIEF COMPLAINT:  Chief Complaint  Patient presents with   Follow-up    2 mon f/u. Alone. Rm 9. Patient mentioned that he is having some tremors. He also stated that he is having issues with walking because of his right knee.     HPI: Stroke admission 07/08/2018: [name] Dollins Broomeis an 63 y.o.malewith PMH TIA, DM 2, HLD, CAD s/p CABG, OSA, migraines, CHF, anxiety and arthritis who presented to [place] with sudden onset blurry vision, diplopia, and confusion.  Telemetry neurology was consulted.  CT head was negative for acute abnormality.  CTA head and neck show calcified arthrosclerosis disease at the carotid bifurcation without hemodynamically significant stenosis without evidence of dissection or aneurysm therefore IV TPA was administered and was transferred to [facility] for further work-up and management.  MRI brain reviewed and was negative for acute infarct or hemorrhage.  MRA negative.  Carotid Doppler showed bilateral ICA stenosis of 1 to 39% with VAs antegrade.  2D echo showed an EF of 60 to 65% without cardiac source of embolus identified.  LDL 38 and recommended resuming Pravachol and Zetia at discharge.  A1c 7.3 and recommended continued follow-up with PCP for DM management.  HTN stable during admission recommended long-term BP goal normotensive range.  Patient was previously on aspirin and Plavix and it was recommended to continue this for 3 weeks and then aspirin 81 mg alone.  Patient was discharged home with recommendations of outpatient PT/OT in stable condition.  Initial consult 08/30/2018: Patient is being seen today for hospital follow up. Continues to have some blurry vision and was told he has "a weak eye" by a provider in the  hospital. He has not follow up with his ophthalmology  since discharge. 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 Has returned to driving without complications. Memory at times is slightly slower but has been improving. Completed 3 weeks of DAPT and continues aspriin 72m only without side effects of bleeding or bruising. Continues to take pravastatin and zetia without myalgias. Blood pressure today 125/71 which is normal for patient as he monitors at home. Patient currently has therapy dog which has been helping patient with his stress and anxiety. History of OSA but has not been using CPAP as he states he is unable to find the correct company to replace parts.  He has not used his CPAP in 2 to 3 months.  He was advised to follow-up with provider who is managing [place] therefore he is requesting establishment with new OSA provider.  No further concerns at this time. Denies new or worsening TIA/stroke symptoms.  Side notes, patient complains of ankle swelling towards end of the day. Denies use of compression stockings. He will elevate with positive effect but concerned due to recent worsening over the past 2-3 weeks. He does state that he has been increasing his exercise in this time with obtaining PT/OT.  He denies shortness of breath, increased fatigue or chest pain.  06/27/2019 update: [name] a  63 year old male who is being seen today for stroke follow-up and new onset concerns of dizziness.  He has 1 month history of slowly worsening lightheaded sensation and gait ataxia.  He denies association with position changes or rapid head movements.  Sensation initially occurring intermittently but currently multiple times daily.  Sensation occurs sitting quietly or with ambulation.   Typically lasts for less than 1 minute and then subsides.  He does endorse having 3 falls over the past month due to lightheaded sensation with loss of balance.  He denies change in medications, diet, activity level or stress level.  He does endorse stressful living situation with his 40 year old son but this has been ongoing and denies increased stress.  Wife endorses generalized shaking/tremors when walking and talking.  He also has association of double vision which is typically present with lightheadedness feeling.  He has had double vision in the past for which he initially presented to ED for stroke work-up in 06/2018.  Vision largely improved since that time but has now worsened with association of lightheadedness.  He has had dizziness sensation in the past related to blood pressure but feels as though the symptoms are different from what he has experienced in the past.  He does monitor blood pressure at home routinely and typically range 130s/70s.  Blood pressure today 127/77.  Continues on aspirin 81 mg and pravastatin for secondary stroke prevention without side effects.  He does have underlying history of OSA with referral placed at prior visit to establish care with a [facility] sleep provider but has not right at this time.  No further concerns at this time  Update 08/30/2019: [name] [name] is being seen today for 32-monthfollow-up regarding new onset of dizziness.  MRI wo contrast obtained which did not show any acute abnormalities.  Attempted to receive contrast with MRI but unfortunately had contrast reaction therefore postcontrast imaging could not be obtained.  He did have consult with our sleep specialist Dr. ARexene Albertson 08/03/2019 for prior history of sleep apnea and underwent sleep study recently on 08/27/2019 for potential need of restarting CPAP therapy.  Vertigo continues without improvement but denies worsening. He will be completing physical therapy at home after 1 additional visit.  He states they  have been mainly focusing on strengthening lower extremities and have not gotten any type of vestibular maneuvers.  Episodes of vertigo can last anywhere from 5 minutes up until 30 minutes and can occur anytime.  He states he will typically feel a pulling towards his left side.  He also endorses hearing loss and tinnitus, unsure if worsened recently.  Previously experience blurred vision with vertigo and this has mildly improved.  He denies headache with episodes.      ROS:   14 system review of systems performed and negative with exception of memory loss, dizziness, headache, numbness, speech difficulty, weakness and facial drooping  PMH:  Past Medical History:  Diagnosis Date   Anxiety    Arthritis    Asthma    Cataract    Right eye   Chronic lower back pain    Coronary atherosclerosis of native coronary artery    a. LAD & OM stenting;  b. 2007 Cath: nonobs dzs, patent stents;  c. 07/2012 neg MV, EF 61%.  d. 11/20/14: [facility]/p  PCI w/ DES to pLAD and DES to OM1    Essential hypertension, benign    GERD (gastroesophageal reflux disease)    Hearing loss of left ear  History of gout    History of hiatal hernia    Hypercholesteremia    Lumbar herniated disc    MI, old    Migraine    OSA on CPAP    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   LIMA to LAD, free RIMA to OM2, SVG to PDA, open SVG harvest from right thigh   Thrombocytopenia (HCC)    TIA (transient ischemic attack) ~ 2010   Type 2 diabetes mellitus (HCC)     PSH:  Past Surgical History:  Procedure Laterality Date   ANTERIOR CERVICAL DECOMP/DISCECTOMY FUSION  1998   "C3-4"   CARDIAC CATHETERIZATION  "several"   CARDIAC CATHETERIZATION N/A 12/30/2015   Procedure: Left Heart Cath and Coronary Angiography;  Surgeon: [name], MD;  Location: [facility];  Service: Cardiovascular;  Laterality: N/A;   CARPAL TUNNEL RELEASE Bilateral 2005   CHOLECYSTECTOMY N/A 11/28/2018   Procedure: LAPAROSCOPIC  CHOLECYSTECTOMY;  Surgeon: [name], MD;  Location: [facility];  Service: General;  Laterality: N/A;   COLONOSCOPY     CORONARY ANGIOPLASTY     CORONARY ANGIOPLASTY WITH STENT PLACEMENT  2002; 2003; 11/20/2014   "I have 4 stents after today" (11/20/2014)   CORONARY ARTERY BYPASS GRAFT N/A 01/09/2016   Procedure: CORONARY ARTERY BYPASS GRAFTING (CABG) X 3 UTILIZING RIGHT AND LEFT INTERNAL MAMMARY ARTERY AND ENDOSCOPICALLY HARVESTED SAPHENEOUS VEIN.;  Surgeon: Rexene Alberts, MD;  Location: [facility];  Service: Open Heart Surgery;  Laterality: N/A;   ESOPHAGOGASTRODUODENOSCOPY     KNEE SURGERY Left 02/2012   "scraped; open"   LEFT HEART CATH AND CORS/GRAFTS ANGIOGRAPHY N/A 08/09/2017   Procedure: LEFT HEART CATH AND CORS/GRAFTS ANGIOGRAPHY;  Surgeon: [name], MD;  Location: [facility];  Service: Cardiovascular;  Laterality: N/A;   LEFT HEART CATH AND CORS/GRAFTS ANGIOGRAPHY N/A 11/22/2018   Procedure: LEFT HEART CATH AND CORS/GRAFTS ANGIOGRAPHY;  Surgeon: [name], [name], MD;  Location: [facility];  Service: Cardiovascular;  Laterality: N/A;   LEFT HEART CATHETERIZATION WITH CORONARY ANGIOGRAM N/A 07/20/2012   Procedure: LEFT HEART CATHETERIZATION WITH CORONARY ANGIOGRAM;  Surgeon: [name], MD;  Location: [facility];  Service: Cardiovascular;  Laterality: N/A;   LEFT HEART CATHETERIZATION WITH CORONARY ANGIOGRAM N/A 11/20/2014   Procedure: LEFT HEART CATHETERIZATION WITH CORONARY ANGIOGRAM;  Surgeon: [name], MD;  Location: [facility];  Service: Cardiovascular;  Laterality: N/A;   LEFT HEART CATHETERIZATION WITH CORONARY ANGIOGRAM N/A 11/26/2014   Procedure: LEFT HEART CATHETERIZATION WITH CORONARY ANGIOGRAM;  Surgeon: [name], MD;  Location: [facility];  Service: Cardiovascular;  Laterality: N/A;   NEUROPLASTY / TRANSPOSITION ULNAR NERVE AT ELBOW Right ~ 2012   PERCUTANEOUS CORONARY ROTOBLATOR INTERVENTION (PCI-R)  11/20/2014   Procedure:  PERCUTANEOUS CORONARY ROTOBLATOR INTERVENTION (PCI-R);  Surgeon: [name], MD;  Location: [facility] C[facility];  Service: Cardiovascular;;   SHOULDER ARTHROSCOPY Left ~ 2011   TEE WITHOUT CARDIOVERSION N/A 01/09/2016   Procedure: TRANSESOPHAGEAL ECHOCARDIOGRAM (TEE);  Surgeon: Rexene Alberts, MD;  Location: [facility];  Service: Open Heart Surgery;  Laterality: N/A;    Social History:  Social History   Socioeconomic History   Marital status: Married    Spouse name: [name]   Number of children: 3   Years of education: 12 th    Highest education level: Not on file  Occupational History   Occupation: Unemployed  Scientist, product/process development strain: Not on file  Food insecurity    Worry: Not on file    Inability: Not on file   Transportation needs    Medical: Not on file    Non-medical: Not on file  Tobacco Use   Smoking status: Never Smoker   Smokeless tobacco: Never Used  Substance and Sexual Activity   Alcohol use: No   Drug use: No   Sexual activity: Yes  Lifestyle   Physical activity    Days per week: Not on file    Minutes per session: Not on file   Stress: Not on file  Relationships   Social connections    Talks on phone: Not on file    Gets together: Not on file    Attends religious service: Not on file    Active member of club or organization: Not on file    Attends meetings of clubs or organizations: Not on file    Relationship status: Not on file   Intimate partner violence    Fear of current or ex partner: Not on file    Emotionally abused: Not on file    Physically abused: Not on file    Forced sexual activity: Not on file  Other Topics Concern   Not on file  Social History Narrative   Patient lives at home with wife [name].   Patient works at [employer], Engineer, petroleum.   Patient has a 12 th grade education.    Patient has 3 children.        Family History:  Family History  Problem Relation Age of Onset   Stroke  Mother    Coronary artery disease Father 59   Asthma Sister    Multiple sclerosis Brother    Heart disease Paternal Uncle    Stroke Maternal Grandmother    Heart attack Paternal Grandmother    Cancer Paternal Grandfather    Asthma Sister    Seizures Son    Migraines Son    Autism Son    Migraines Son     Medications:   Current Outpatient Medications on File Prior to Visit  Medication Sig Dispense Refill   albuterol (PROVENTIL HFA;VENTOLIN HFA) 108 (90 BASE) MCG/ACT inhaler Inhale 2 puffs into the lungs every 6 (six) hours as needed for wheezing or shortness of breath.     amLODipine (NORVASC) 5 MG tablet Take 5 mg by mouth at bedtime.  4   aspirin EC 81 MG tablet Take 81 mg by mouth at bedtime.      citalopram (CELEXA) 20 MG tablet Take 20 mg by mouth at bedtime.      diazepam (VALIUM) 5 MG tablet Take 5 mg by mouth every 6 (six) hours as needed for anxiety.     ergocalciferol (VITAMIN D2) 1.25 MG (50000 UT) capsule Take 1 capsule (50,000 Units total) by mouth once a week. 20 capsule 1   fluticasone (FLONASE) 50 MCG/ACT nasal spray Place 1 spray into the nose 2 (two) times daily.      Fluticasone-Salmeterol (ADVAIR) 250-50 MCG/DOSE AEPB Inhale 1 puff into the lungs every 12 (twelve) hours.     gabapentin (NEURONTIN) 300 MG capsule Take 300 mg by mouth 3 (three) times daily.  2   insulin aspart (NOVOLOG FLEXPEN) 100 UNIT/ML FlexPen Inject 2-30 Units into the skin 3 (three) times daily as needed for high blood sugar (CBG >150). Per sliding scale written down at home      isosorbide mononitrate (IMDUR) 30 MG 24 hr tablet Take 30 mg  by mouth at bedtime.  4   Melatonin 5 MG TABS Take 10 mg by mouth at bedtime.      metFORMIN (GLUCOPHAGE) 1000 MG tablet Take 1 tablet (1,000 mg total) by mouth 2 (two) times daily. For the next 2 days as you did receive IV contrast, then resume on 11/26/2018 60 tablet 11   metoprolol tartrate (LOPRESSOR) 50 MG tablet Take 25 mg by  mouth 2 (two) times daily.      omeprazole (PRILOSEC) 20 MG capsule Take 20 mg by mouth 2 (two) times daily before a meal.     pravastatin (PRAVACHOL) 20 MG tablet Take 1 tablet (20 mg total) by mouth daily. 10 tablet 0   topiramate (TOPAMAX) 25 MG capsule Take 50 mg by mouth 2 (two) times daily.      TRESIBA FLEXTOUCH 100 UNIT/ML SOPN FlexTouch Pen Inject 70 Units into the skin at bedtime.   5   nitroGLYCERIN (NITROSTAT) 0.4 MG SL tablet Place 1 tablet (0.4 mg total) under the tongue every 5 (five) minutes x 3 doses as needed for chest pain (if no relief after 3rd dose, proceed to the ED for an evaluation or call 911). 25 tablet 3   No current facility-administered medications on file prior to visit.     Allergies:   Allergies  Allergen Reactions   Divalproex Sodium Other (See Comments)    Causes anger   Imdur [Isosorbide Dinitrate] Other (See Comments)    Severe headache (trying lower dose 07/05/18)   Statins Other (See Comments)    Muscle aches and cramps   Tramadol Other (See Comments)    Chest pain    Valproic Acid Other (See Comments)    "anger"   Gadolinium Derivatives Nausea And Vomiting    07/25/19 Pt vomited immediately after IV gad. Denies itching, dyspnea.  (Adverse, not allergic, reaction   Tricor [Fenofibrate] Other (See Comments)    Leg cramps     Physical Exam  Vitals:   08/30/19 0802  BP: 117/72  Pulse: 63  Temp: (!) 97.1 F (36.2 C)  TempSrc: Oral  Weight: 238 lb 3.2 oz (108 kg)  Height: 5' 9"  (1.753 m)   Body mass index is 35.18 kg/m. No exam data present  General: well developed, well nourished, pleasant middle-aged Caucasian male, seated, in no evident distress Head: head normocephalic and atraumatic.   Neck: supple with no carotid or supraclavicular bruits Cardiovascular: regular rate and rhythm, no murmurs Musculoskeletal: no deformity Skin:  no rash/petichiae Vascular:  Normal pulses all extremities  Neurologic Exam Mental  Status: Awake and fully alert. Oriented to place and time. Recent and remote memory intact. Attention span, concentration and fund of knowledge appropriate. Mood and affect appropriate.  Cranial Nerves: Pupils equal, briskly reactive to light. Extraocular movements 3 beat horizontal nystagmus.  Visual fields full to confrontation.  Left ear: BC>AC.  Weber test lateralized to the left ear.  facial sensation intact. Face, tongue, palate moves normally and symmetrically.  Motor: Normal bulk and tone. Normal strength in all tested extremity muscles.   Sensory.:  Lack of light touch and vibratory sensation bilateral lower extremities distally (chronic) Coordination: Rapid alternating movements normal in all extremities. Finger-to-nose and heel-to-shin performed accurately bilaterally.  Mild action tremors bilateral upper extremities Gait and Station: Arises from chair with mild difficulty. Stance is normal. Gait demonstrates short shuffled steps with imbalance Reflexes: 1+ and symmetric. Toes downgoing.      Diagnostic Data (Labs, Imaging, Testing)  MR BRAIN WO  CONTRAST 07/25/2019 IMPRESSION:  MRI brain (without) demonstrating: - Mild scattered periventricular and subcortical foci of non-specific gliosis. No acute imaging findings. - IV contrast reaction; post-contrast imaging could not be obtained.   ASSESSMENT: [name] [name] is a 63 y.o. year old male with strokelike episode S/P IV TPA on 07/08/2018. Vascular risk factors include HTN, HLD, DM, OSA, CAD and CHF.  Residual deficit of blurred vision/diplopia.  Recent onset of daily vertigo associated with tinnitus, worsening blurred vision and hearing loss.  MRI wo contrast unremarkable (intolerant to contrast).   PLAN: -Referral placed to ENT for further evaluation of vertigo for possible Mnire's disease versus BPPV -Referral placed to outpatient PT for vestibular rehab -currently receiving home health therapy will be completed after 1 more  session and request initiation of outpatient therapy once completed -Continue to follow with Dr. Rexene Alberts regarding recent sleep test -advised patient he will be called with results -Continue aspirin 81 mg daily  and pravastatin and Zetia for secondary stroke prevention -F/u with PCP regarding your HLD, HTN and DM management -continue to monitor BP at home -advised to continue to stay active and maintain a healthy diet -Maintain strict control of hypertension with blood pressure goal below 130/90, diabetes with hemoglobin A1c goal below 6.5% and cholesterol with LDL cholesterol (bad cholesterol) goal below 70 mg/dL. I also advised the patient to eat a healthy diet with plenty of whole grains, cereals, fruits and vegetables, exercise regularly and maintain ideal body weight.  Follow-up will be determined after evaluation of ENT and after vestibular rehab   Greater than 50% of time during this 25 minute visit was spent on counseling, discussion regarding new onset symptoms, reviewing risk factor management of HTN, HLD, DM, CAD, CHF and OSA, planning of further management, discussion with patient and family and coordination of care    Frann Rider, [facility]  [facility] [address] [place], [place] [address]  Phone [phone] Fax [phone] Note: This document was prepared with digital dictation and possible smart phrase technology. Any transcriptional errors that result from this process are unintentional.

## 2019-09-01 NOTE — Procedures (Signed)
PATIENT'S NAME:  [name] [name], [name] [name] DOB:      Jul 02, 1956      MRN:    [id number]     DATE OF RECORDING: 08/27/2019 REFERRING M.D.:  Frann Rider, NP  Study Performed:   Baseline Polysomnogram HISTORY: 63 year old man with a history of degenerative lumbar disc disease, gout, reflux disease, cataracts, asthma, arthritis, anxiety, thrombocytopenia, hearing loss, hypertension, diabetes, history of TIA-like symptoms, hyperlipidemia, heart disease with history of MI and status post CABG, and obesity, who was previously diagnosed with obstructive sleep apnea and placed on CPAP therapy. The patient endorsed the Epworth Sleepiness Scale at 14/24 points and the FSS at   Points. The patient's weight 234 pounds with a height of 71 (inches), resulting in a BMI of 32.7 kg/m2. The patient's neck circumference measured 17.2 inches.  CURRENT MEDICATIONS: Proventil, Norvasc, Celexa, Valium, Vitamin D2, Flonase, Advair, Novolog, Imdur, Glucophage, Lopressor, Prilosec, Pravachol, Topamax, Nitrostat.   PROCEDURE:  This is a multichannel digital polysomnogram utilizing the Somnostar 11.2 system.  Electrodes and sensors were applied and monitored per AASM Specifications.   EEG, EOG, Chin and Limb EMG, were sampled at 200 Hz.  ECG, Snore and Nasal Pressure, Thermal Airflow, Respiratory Effort, CPAP Flow and Pressure, Oximetry was sampled at 50 Hz. Digital video and audio were recorded.      BASELINE STUDY: Lights Out was at 21:51 and Lights On at 05:02.  Total recording time (TRT) was 432 minutes, with a total sleep time (TST) of 402 minutes.   The patient's sleep latency was 1.5 minutes.  REM latency was 203 minutes, which is delayed. The sleep efficiency was 93.1 %.     SLEEP ARCHITECTURE: WASO (Wake after sleep onset) was 28 minutes, with mild sleep fragmentation today. There were 4 minutes in Stage N1, 370.5 minutes Stage N2, 0 minutes Stage N3 and 27.5 minutes in Stage REM.  The percentage of Stage N1 was 1.%, Stage N2  was 92.2%, which is markedly increased, Stage N3 was absent, and Stage R (REM sleep) was 6.8%, which is markedly reduced.   RESPIRATORY ANALYSIS:  There were a total of 293 respiratory events:  141 obstructive apneas, 46 central apneas and 3 mixed apneas with a total of 190 apneas and an apnea index (AI) of 28.4 /hour. There were 103 hypopneas with a hypopnea index of 15.4 /hour. The patient also had 0 respiratory event related arousals (RERAs).      The total APNEA/HYPOPNEA INDEX (AHI) was 43.7 /hour and the total RESPIRATORY DISTURBANCE INDEX was 43.7 /hour.  36 events occurred in REM sleep and 227 events in NREM. The REM AHI was 78.5 /hour, versus a non-REM AHI of 41.2. The patient spent 402 minutes of total sleep time in the supine position and 0 minutes in non-supine.. The supine AHI was 43.8/h versus a non-supine AHI of 0.0/h.  OXYGEN SATURATION & C02:  The Wake baseline 02 saturation was 94%, with the lowest being 80%. Time spent below 89% saturation equaled 64 minutes.  AROUSALS:  The arousals were noted as: 169 were spontaneous, 44 were associated with PLMs, 119 were associated with respiratory events.  The patient had a total of 213 Periodic Limb Movements.  The Periodic Limb Movement (PLM) index was 31.8 and the PLM Arousal index was 6.6/hour. Audio and video analysis did not show any abnormal or unusual movements, complex behaviors, phonations or vocalizations. He did not take a bathroom break. Moderate to loud snoring was noted. The EKG showed normal sinus rhythm (NSR).  IMPRESSION: 1. Severe Obstructive Sleep Apnea (OSA) 2. Periodic Limb Movement Disorder (PLMD) 3. Dysfunctions associated with sleep stages or arousals from sleep   RECOMMENDATIONS: 1. This study demonstrates severe obstructive sleep apnea, with a total AHI of 43.7/hour, REM AHI of 78.5/hour, supine AHI of 43.8/hour and O2 nadir of 80%. Treatment with positive airway pressure in the form of CPAP is recommended. This  will require a full night titration study to optimize therapy. Other treatment options may include avoidance of supine sleep position along with weight loss, upper airway or jaw surgery in selected patients or the use of an oral appliance in certain patients. ENT evaluation and/or consultation with a maxillofacial surgeon or dentist may be feasible in some instances.    2. Please note that untreated obstructive sleep apnea may carry additional perioperative morbidity. Patients with significant obstructive sleep apnea should receive perioperative PAP therapy and the surgeons and particularly the anesthesiologist should be informed of the diagnosis and the severity of the sleep disordered breathing. 3. This study shows sleep fragmentation and abnormal sleep stage percentages; these are nonspecific findings and per se do not signify an intrinsic sleep disorder or a cause for the patient's sleep-related symptoms. Causes include (but are not limited to) the first night effect of the sleep study, circadian rhythm disturbances, medication effect or an underlying mood disorder or medical problem.  4. Moderate PLMs (periodic limb movements of sleep) were noted during this study with minimal arousals; clinical correlation is recommended. Medication effect from the antidepressant medication should be considered.  5. The patient should be cautioned not to drive, work at heights, or operate dangerous or heavy equipment when tired or sleepy. Review and reiteration of good sleep hygiene measures should be pursued with any patient. 6. The patient will be seen in follow-up in the sleep clinic at [facility] for discussion of the test results, symptom and treatment compliance review, further management strategies, etc. The referring provider will be notified of the test results.  I certify that I have reviewed the entire raw data recording prior to the issuance of this report in accordance with the Standards of Accreditation of the  American Academy of Sleep Medicine (AASM)  Star Age, MD, PhD Diplomat, American Board of Neurology and Sleep Medicine (Neurology and Sleep Medicine)

## 2019-09-01 NOTE — Progress Notes (Signed)
Patient referred by Frann Rider, seen by me on 08/03/19, diagnostic PSG on 08/27/19.   Please call and notify the patient that the recent sleep study showed severe obstructive sleep apnea. I recommend treatment for this in the form of CPAP. This will require a repeat sleep study for proper titration and mask fitting and correct monitoring of the oxygen saturations. Please explain to patient. I have placed an order in the chart. Thanks.  Star Age, MD, PhD [facility])

## 2019-09-01 NOTE — Addendum Note (Signed)
Addended by: Star Age on: 09/01/2019 04:05 PM   Modules accepted: Orders

## 2019-09-04 ENCOUNTER — Telehealth: Payer: Self-pay

## 2019-09-04 NOTE — Telephone Encounter (Signed)
I called pt. I advised pt that Dr. Rexene Alberts reviewed their sleep study results and found that pt has severe osa and recommends that pt be treated with a cpap. Dr. Rexene Alberts recommends that pt return for a repeat sleep study in order to properly titrate the cpap and ensure a good mask fit. Pt is agreeable to returning for a titration study. I advised pt that our sleep lab will file with pt's insurance and call pt to schedule the sleep study when we hear back from the pt's insurance regarding coverage of this sleep study. Pt verbalized understanding of results. Pt had no questions at this time but was encouraged to call back if questions arise.

## 2019-09-04 NOTE — Telephone Encounter (Signed)
-----   Message from Star Age, MD sent at 09/01/2019  3:51 PM EDT ----- Patient referred by Frann Rider, seen by me on 08/03/19, diagnostic PSG on 08/27/19.   Please call and notify the patient that the recent sleep study showed severe obstructive sleep apnea. I recommend treatment for this in the form of CPAP. This will require a repeat sleep study for proper titration and mask fitting and correct monitoring of the oxygen saturations. Please explain to patient. I have placed an order in the chart. Thanks.  Star Age, MD, PhD [facility])

## 2019-09-05 ENCOUNTER — Ambulatory Visit (HOSPITAL_COMMUNITY): Payer: Medicare Other | Admitting: Nurse Practitioner

## 2019-09-05 ENCOUNTER — Other Ambulatory Visit: Payer: Self-pay

## 2019-09-06 ENCOUNTER — Inpatient Hospital Stay (HOSPITAL_BASED_OUTPATIENT_CLINIC_OR_DEPARTMENT_OTHER): Payer: Medicare Other | Admitting: Hematology

## 2019-09-06 VITALS — BP 129/65 | HR 63 | Temp 97.9°F | Resp 14 | Wt 240.3 lb

## 2019-09-06 DIAGNOSIS — E559 Vitamin D deficiency, unspecified: Secondary | ICD-10-CM | POA: Diagnosis not present

## 2019-09-06 DIAGNOSIS — Z0000 Encounter for general adult medical examination without abnormal findings: Secondary | ICD-10-CM | POA: Diagnosis not present

## 2019-09-06 DIAGNOSIS — D696 Thrombocytopenia, unspecified: Secondary | ICD-10-CM | POA: Diagnosis not present

## 2019-09-06 MED ORDER — INFLUENZA VAC SPLIT QUAD 0.5 ML IM SUSY
0.5000 mL | PREFILLED_SYRINGE | Freq: Once | INTRAMUSCULAR | Status: AC
  Administered 2019-09-06: 0.5 mL via INTRAMUSCULAR
  Filled 2019-09-06: qty 0.5

## 2019-09-06 NOTE — Assessment & Plan Note (Signed)
1.  Mild thrombocytopenia: -She has mild thrombocytopenia since 2009.  Ultrasound of the abdomen on 11/22/2018 shows borderline splenomegaly, 12.3 cm, increased volume. -CTAP on 11/23/2018 showed hepatic cirrhosis. -Hepatitis panel was negative.  Nutritional work-up including serum copper, folic acid, R67 were within normal limits.  SPEP was negative.  H. pylori antibodies were negative. -ANA was negative.  Rheumatoid factor was strongly positive at 68.9. -He had reported arthritic pains of small joints of the hands and feet.  He was evaluated by rheumatology who felt it was osteoarthritis. -CBC on 08/30/2019 shows platelet count of 105.  He does not report any bleeding issues. -We will see him back in 6 months for follow-up.  2.  Normocytic anemia: -CBC on 08/30/2019 shows hemoglobin 16 and hematocrit of 46.3.  MCV is 88.7. As she is continuing iron tablet once a week along with stool softener. -Last colonoscopy was more than 5 years ago in [place], [place]. -As his B12 level was borderline low, he was recommended to take B12 tablet daily which he is.  Ferritin level is 68.  3.  Vitamin D deficiency: -Initial vitamin D was 13.2.  He was started on vitamin D 50,000 units weekly. -His vitamin D level on 08/30/2019 was 65.75. -We will plan to repeat it again in 6 months.  4.  Cirrhosis: -Cholecystectomy and liver biopsy on 11/28/2018 shows cirrhosis. -He follows up with Dr. Laural Golden.

## 2019-09-06 NOTE — Patient Instructions (Signed)
[place] at [facility] Discharge Instructions  You were seen today by Dr. Delton Coombes. He went over your recent labs results. He will see you back in 6 months for labs and follow up.   Thank you for choosing [place] at [facility] to provide your oncology and hematology care.  To afford each patient quality time with our provider, please arrive at least 15 minutes before your scheduled appointment time.   If you have a lab appointment with the [facility] please come in thru the  Main Entrance and check in at the main information desk  You need to re-schedule your appointment should you arrive 10 or more minutes late.  We strive to give you quality time with our providers, and arriving late affects you and other patients whose appointments are after yours.  Also, if you no show three or more times for appointments you may be dismissed from the clinic at the providers discretion.     Again, thank you for choosing [facility].  Our hope is that these requests will decrease the amount of time that you wait before being seen by our physicians.       _____________________________________________________________  Should you have questions after your visit to [facility], please contact our office at [phone] between the hours of 8:00 a.m. and 4:30 p.m.  Voicemails left after 4:00 p.m. will not be returned until the following business day.  For prescription refill requests, have your pharmacy contact our office and allow 72 hours.    Cancer Center Support Programs:   > Cancer Support Group  2nd Tuesday of the month 1pm-2pm, Journey Room

## 2019-09-06 NOTE — Progress Notes (Signed)
[address] [place], [place] [address]   CLINIC:  Medical Oncology/Hematology  PCP:  Terrial Rhodes, MD [place] [address] [phone]   REASON FOR VISIT:  Thrombocytopenia, iron deficiency, B12 deficiency, vitamin D deficiency     INTERVAL HISTORY:  [name] [name] 63 y.o. male seen for follow-up of thrombocytopenia, iron deficiency, B12 deficiency and vitamin D deficiency.  Denies any fevers, night sweats or weight loss in the last 6 months.  Denies any easy bruising, nosebleeds, hematuria, bleeding per rectum or melena.  No recent ER visits or hospitalizations.  [name] [name] is taking iron tablet once a week denies any GI side effects including nausea, vomiting, diarrhea or constipation.     REVIEW OF SYSTEMS:  Review of Systems  Constitutional: Positive for fatigue.  All other systems reviewed and are negative.    PAST MEDICAL/SURGICAL HISTORY:  Past Medical History:  Diagnosis Date   Anxiety    Arthritis    Asthma    Cataract    Right eye   Chronic lower back pain    Coronary atherosclerosis of native coronary artery    a. LAD & OM stenting;  b. 2007 Cath: nonobs dzs, patent stents;  c. 07/2012 neg MV, EF 61%.  d. 11/20/14: [place] s/p  PCI w/ DES to pLAD and DES to OM1    Essential hypertension, benign    GERD (gastroesophageal reflux disease)    Hearing loss of left ear    History of gout    History of hiatal hernia    Hypercholesteremia    Lumbar herniated disc    MI, old    Migraine    OSA on CPAP    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   LIMA to LAD, free RIMA to OM2, SVG to PDA, open SVG harvest from right thigh   Thrombocytopenia (HCC)    TIA (transient ischemic attack) ~ 2010   Type 2 diabetes mellitus ([place])    Past Surgical History:  Procedure Laterality Date   ANTERIOR CERVICAL DECOMP/DISCECTOMY FUSION  1998   "C3-4"   CARDIAC CATHETERIZATION  "several"   CARDIAC CATHETERIZATION N/A 12/30/2015   Procedure: Left Heart Cath and Coronary Angiography;  Surgeon: [name], MD;  Location: [facility];  Service: Cardiovascular;  Laterality: N/A;   CARPAL TUNNEL RELEASE Bilateral 2005   CHOLECYSTECTOMY N/A 11/28/2018   Procedure: LAPAROSCOPIC CHOLECYSTECTOMY;  Surgeon: [name], MD;  Location: [facility];  Service: General;  Laterality: N/A;   COLONOSCOPY     CORONARY ANGIOPLASTY     CORONARY ANGIOPLASTY WITH STENT PLACEMENT  2002; 2003; 11/20/2014   "I have 4 stents after today" (11/20/2014)   CORONARY ARTERY BYPASS GRAFT N/A 01/09/2016   Procedure: CORONARY ARTERY BYPASS GRAFTING (CABG) X 3 UTILIZING RIGHT AND LEFT INTERNAL MAMMARY ARTERY AND ENDOSCOPICALLY HARVESTED SAPHENEOUS VEIN.;  Surgeon: Rexene Alberts, MD;  Location: [facility];  Service: Open Heart Surgery;  Laterality: N/A;   ESOPHAGOGASTRODUODENOSCOPY     KNEE SURGERY Left 02/2012   "scraped; open"   LEFT HEART CATH AND CORS/GRAFTS ANGIOGRAPHY N/A 08/09/2017   Procedure: LEFT HEART CATH AND CORS/GRAFTS ANGIOGRAPHY;  Surgeon: [name], MD;  Location: [facility];  Service: Cardiovascular;  Laterality: N/A;   LEFT HEART CATH AND CORS/GRAFTS ANGIOGRAPHY N/A 11/22/2018   Procedure: LEFT HEART CATH AND CORS/GRAFTS ANGIOGRAPHY;  Surgeon: [name], [name], MD;  Location: [facility];  Service: Cardiovascular;  Laterality: N/A;  LEFT HEART CATHETERIZATION WITH CORONARY ANGIOGRAM N/A 07/20/2012   Procedure: LEFT HEART CATHETERIZATION WITH CORONARY ANGIOGRAM;  Surgeon: [name], MD;  Location: [facility];  Service: Cardiovascular;  Laterality: N/A;   LEFT HEART CATHETERIZATION WITH CORONARY ANGIOGRAM N/A 11/20/2014   Procedure: LEFT HEART CATHETERIZATION WITH CORONARY ANGIOGRAM;  Surgeon: [name], MD;  Location: [facility];  Service: Cardiovascular;  Laterality: N/A;   LEFT HEART CATHETERIZATION WITH CORONARY ANGIOGRAM N/A 11/26/2014   Procedure: LEFT HEART CATHETERIZATION WITH  CORONARY ANGIOGRAM;  Surgeon: [name], MD;  Location: [facility];  Service: Cardiovascular;  Laterality: N/A;   NEUROPLASTY / TRANSPOSITION ULNAR NERVE AT ELBOW Right ~ 2012   PERCUTANEOUS CORONARY ROTOBLATOR INTERVENTION (PCI-R)  11/20/2014   Procedure: PERCUTANEOUS CORONARY ROTOBLATOR INTERVENTION (PCI-R);  Surgeon: [name], MD;  Location: [facility];  Service: Cardiovascular;;   SHOULDER ARTHROSCOPY Left ~ 2011   TEE WITHOUT CARDIOVERSION N/A 01/09/2016   Procedure: TRANSESOPHAGEAL ECHOCARDIOGRAM (TEE);  Surgeon: Rexene Alberts, MD;  Location: [facility];  Service: Open Heart Surgery;  Laterality: N/A;     SOCIAL HISTORY:  Social History   Socioeconomic History   Marital status: Married    Spouse name: [name]   Number of children: 3   Years of education: 12 th    Highest education level: Not on file  Occupational History   Occupation: Unemployed  Scientist, product/process development strain: Not on file   Food insecurity    Worry: Not on file    Inability: Not on file   Transportation needs    Medical: Not on file    Non-medical: Not on file  Tobacco Use   Smoking status: Never Smoker   Smokeless tobacco: Never Used  Substance and Sexual Activity   Alcohol use: No   Drug use: No   Sexual activity: Yes  Lifestyle   Physical activity    Days per week: Not on file    Minutes per session: Not on file   Stress: Not on file  Relationships   Social connections    Talks on phone: Not on file    Gets together: Not on file    Attends religious service: Not on file    Active member of club or organization: Not on file    Attends meetings of clubs or organizations: Not on file    Relationship status: Not on file   Intimate partner violence    Fear of current or ex partner: Not on file    Emotionally abused: Not on file    Physically abused: Not on file    Forced sexual activity: Not on file  Other Topics Concern   Not on file  Social  History Narrative   Patient lives at home with wife [name].   Patient works at [employer], Engineer, petroleum.   Patient has a 12 th grade education.    Patient has 3 children.        FAMILY HISTORY:  Family History  Problem Relation Age of Onset   Stroke Mother    Coronary artery disease Father 14   Asthma Sister    Multiple sclerosis Brother    Heart disease Paternal Uncle    Stroke Maternal Grandmother    Heart attack Paternal Grandmother    Cancer Paternal Grandfather    Asthma Sister    Seizures Son    Migraines Son    Autism Son    Migraines Son  CURRENT MEDICATIONS:  Outpatient Encounter Medications as of 09/06/2019  Medication Sig   amLODipine (NORVASC) 5 MG tablet Take 5 mg by mouth at bedtime.   aspirin EC 81 MG tablet Take 81 mg by mouth at bedtime.    citalopram (CELEXA) 20 MG tablet Take 20 mg by mouth at bedtime.    ergocalciferol (VITAMIN D2) 1.25 MG (50000 UT) capsule Take 1 capsule (50,000 Units total) by mouth once a week.   Fluticasone-Salmeterol (ADVAIR) 250-50 MCG/DOSE AEPB Inhale 1 puff into the lungs every 12 (twelve) hours.   gabapentin (NEURONTIN) 300 MG capsule Take 300 mg by mouth 3 (three) times daily.   insulin aspart (NOVOLOG FLEXPEN) 100 UNIT/ML FlexPen Inject 2-30 Units into the skin 3 (three) times daily as needed for high blood sugar (CBG >150). Per sliding scale written down at home    isosorbide mononitrate (IMDUR) 30 MG 24 hr tablet Take 30 mg by mouth at bedtime.   Melatonin 5 MG TABS Take 10 mg by mouth at bedtime.    metFORMIN (GLUCOPHAGE) 1000 MG tablet Take 1 tablet (1,000 mg total) by mouth 2 (two) times daily. For the next 2 days as you did receive IV contrast, then resume on 11/26/2018   metoprolol tartrate (LOPRESSOR) 50 MG tablet Take 25 mg by mouth 2 (two) times daily.    omeprazole (PRILOSEC) 20 MG capsule Take 20 mg by mouth 2 (two) times daily before a meal.   pravastatin (PRAVACHOL) 20 MG  tablet Take 1 tablet (20 mg total) by mouth daily.   topiramate (TOPAMAX) 25 MG capsule Take 50 mg by mouth 2 (two) times daily.    TRESIBA FLEXTOUCH 100 UNIT/ML SOPN FlexTouch Pen Inject 70 Units into the skin at bedtime.    albuterol (PROVENTIL HFA;VENTOLIN HFA) 108 (90 BASE) MCG/ACT inhaler Inhale 2 puffs into the lungs every 6 (six) hours as needed for wheezing or shortness of breath.   diazepam (VALIUM) 5 MG tablet Take 5 mg by mouth every 6 (six) hours as needed for anxiety.   fluticasone (FLONASE) 50 MCG/ACT nasal spray Place 1 spray into the nose 2 (two) times daily.    nitroGLYCERIN (NITROSTAT) 0.4 MG SL tablet Place 1 tablet (0.4 mg total) under the tongue every 5 (five) minutes x 3 doses as needed for chest pain (if no relief after 3rd dose, proceed to the ED for an evaluation or call 911). (Patient not taking: Reported on 09/06/2019)   [EXPIRED] influenza vac split quadrivalent PF (FLUARIX) injection 0.5 mL    No facility-administered encounter medications on file as of 09/06/2019.     ALLERGIES:  Allergies  Allergen Reactions   Divalproex Sodium Other (See Comments)    Causes anger   Imdur [Isosorbide Dinitrate] Other (See Comments)    Severe headache (trying lower dose 07/05/18)   Statins Other (See Comments)    Muscle aches and cramps   Tramadol Other (See Comments)    Chest pain    Valproic Acid Other (See Comments)    "anger"   Gadolinium Derivatives Nausea And Vomiting    07/25/19 Pt vomited immediately after IV gad. Denies itching, dyspnea.  (Adverse, not allergic, reaction   Tricor [Fenofibrate] Other (See Comments)    Leg cramps     PHYSICAL EXAM:  ECOG Performance status: 1  Vitals:   09/06/19 1513  BP: 129/65  Pulse: 63  Resp: 14  Temp: 97.9 F (36.6 C)  SpO2: 99%   Filed Weights   09/06/19 1513  Weight: 240  lb 4.8 oz (109 kg)    Physical Exam Constitutional:      Appearance: Normal appearance.  Cardiovascular:     Rate and  Rhythm: Normal rate and regular rhythm.  Pulmonary:     Effort: Pulmonary effort is normal.     Breath sounds: Normal breath sounds.  Abdominal:     General: Bowel sounds are normal. There is no distension.     Palpations: Abdomen is soft.  Musculoskeletal:        General: No swelling.  Skin:    General: Skin is warm.  Neurological:     General: No focal deficit present.     Mental Status: [name] [name] is alert and oriented to person, place, and time.  Psychiatric:        Mood and Affect: Mood normal.        Behavior: Behavior normal.      LABORATORY DATA:  I have reviewed the labs as listed.  CBC    Component Value Date/Time   WBC 4.2 08/30/2019 1017   RBC 5.22 08/30/2019 1017   HGB 16.0 08/30/2019 1017   HCT 46.3 08/30/2019 1017   PLT 105 (L) 08/30/2019 1017   MCV 88.7 08/30/2019 1017   MCH 30.7 08/30/2019 1017   MCHC 34.6 08/30/2019 1017   RDW 13.3 08/30/2019 1017   LYMPHSABS 1.5 08/30/2019 1017   MONOABS 0.3 08/30/2019 1017   EOSABS 0.1 08/30/2019 1017   BASOSABS 0.0 08/30/2019 1017   CMP Latest Ref Rng & Units 08/30/2019 04/27/2019 04/06/2019  Glucose 70 - 99 mg/dL 167(H) 104(H) 260(H)  BUN 8 - 23 mg/dL 21 17 19   Creatinine 0.61 - 1.24 mg/dL 1.13 0.97 1.07  Sodium 135 - 145 mmol/L 139 139 136  Potassium 3.5 - 5.1 mmol/L 4.0 3.6 4.2  Chloride 98 - 111 mmol/L 104 107 106  CO2 22 - 32 mmol/L 24 24 21(L)  Calcium 8.9 - 10.3 mg/dL 9.7 9.1 9.1  Total Protein 6.5 - 8.1 g/dL 7.7 7.4 -  Total Bilirubin 0.3 - 1.2 mg/dL 0.6 0.7 -  Alkaline Phos 38 - 126 U/L 44 50 -  AST 15 - 41 U/L 35 25 -  ALT 0 - 44 U/L 29 28 -       DIAGNOSTIC IMAGING:  I have independently reviewed the scans and discussed with the patient.   I have reviewed Venita Lick LPN's note and agree with the documentation.  I personally performed a face-to-face visit, made revisions and my assessment and plan is as follows.    ASSESSMENT & PLAN:   Thrombocytopenia ([place]) 1.  Mild thrombocytopenia: -She  has mild thrombocytopenia since 2009.  Ultrasound of the abdomen on 11/22/2018 shows borderline splenomegaly, 12.3 cm, increased volume. -CTAP on 11/23/2018 showed hepatic cirrhosis. -Hepatitis panel was negative.  Nutritional work-up including serum copper, folic acid, K93 were within normal limits.  SPEP was negative.  H. pylori antibodies were negative. -ANA was negative.  Rheumatoid factor was strongly positive at 68.9. -[name] [name] had reported arthritic pains of small joints of the hands and feet.  [name] [name] was evaluated by rheumatology who felt it was osteoarthritis. -CBC on 08/30/2019 shows platelet count of 105.  [name] [name] does not report any bleeding issues. -We will see him back in 6 months for follow-up.  2.  Normocytic anemia: -CBC on 08/30/2019 shows hemoglobin 16 and hematocrit of 46.3.  MCV is 88.7. As she is continuing iron tablet once a week along with stool softener. -Last colonoscopy was more than 5  years ago in [place], [place]. -As [name] [name] B12 level was borderline low, [name] [name] was recommended to take B12 tablet daily which [name] [name] is.  Ferritin level is 68.  3.  Vitamin D deficiency: -Initial vitamin D was 13.2.  [name] [name] was started on vitamin D 50,000 units weekly. -[name] [name] vitamin D level on 08/30/2019 was 65.75. -We will plan to repeat it again in 6 months.  4.  Cirrhosis: -Cholecystectomy and liver biopsy on 11/28/2018 shows cirrhosis. -[name] [name] follows up with Dr. Laural Golden.  Total time spent is 25 minutes with more than 50% of the time spent face-to-face discussing lab results, counseling, and coordination of care.    Orders placed this encounter:  Orders Placed This Encounter  Procedures   CBC with Differential/Platelet   Comprehensive metabolic panel   Vitamin W58   Vitamin D 25 hydroxy   Iron and TIBC   Ferritin      Derek Jack, MD [place] [phone]

## 2019-09-07 ENCOUNTER — Telehealth: Payer: Self-pay | Admitting: Adult Health

## 2019-09-07 NOTE — Telephone Encounter (Signed)
PT Pete called RN Lovey Newcomer back to inform it is for out pt not Ocupational Therapy.  He will wait for a call back on Monday

## 2019-09-07 NOTE — Telephone Encounter (Signed)
LMVM for PETE with [facility] that per JM/NP ok to proceed with OT.

## 2019-09-07 NOTE — Telephone Encounter (Signed)
Pete from [facility] called wanting to speak to RN to discuss the possibility of OT for the pt. Please advise.

## 2019-09-11 NOTE — Progress Notes (Signed)
Subjective:    Patient ID: [name] [name], male    DOB: Jun 10, 1956, 63 y.o.   MRN: [id number]  HPI Mr. Bashir Marchetti is a 63 year old male with a past medical history of anxiety, chronic back pain, OSA, DM II, CAD, 5 stents 2001-2007, s/p 3 vessel CABG 2017, TIA 2017, thrombocytopenia, IDA  followed by Dr. 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 S/P laparoscopic cholecystectomy on 11/28/2018 and liver biopsy was performed by Dr. Constance Haw which identified cirrhosis stage 4 of 4.  He presents today to schedule a colonoscopy. He had a colonoscopy at the age of 72 which he reported was normal. No family history of colorectal cancer. He is passing a normal brown formed stool once daily. No rectal bleeding or melena. He has a chronic history of GERD for which he takes Omeprazole 46m bid. He takes ASA 857mQD. No other NSAIDS. He has intermittent heartburn and for the past 3 days food is getting stuck in his throat/upper esophagus, he gags and the food is expelled. No difficulty swallowing liquids. He takes an iron tab once weekly which he started 5 weeks ago. No alcohol use. He smokes 1/2 ppd. No family history of liver disease or colorectal cancer. He has gained 13lbs since May 2020. Weight today 336lbs.  Abdominal sonogram 11/24/2018: Question fatty infiltration of liver as above. Trace perihepatic ascites. Otherwise negative exam.  Liver biopsy 11/28/2018: - CIRRHOSIS (STAGE 4 OF 4) OF UNCERTAIN ETIOLOGY. SEE NOTE - MILD STEATOSIS Diagnosis Note 2. The liver biopsy shows a nodular architecture with areas of fibrotic parenchymal collapse. Trichrome stain highlights fibrous septa that divide the parenchyma into small nodules and reticulin stain highlights the regenerative nodularity. Septa contain all the expected biliary and vascular structures, which are histologically unremarkable. A ductular reaction, mild in degree, is present at  the periphery of the septa. A mild to moderate mononuclear cell infiltrate composed principally of lymphocytes is present in the septa without significant interface hepatitis. Macrovesicular steatosis affects approximately 10% hepatocytes. Ballooning degeneration or Mallory bodies are not identified. Cholestasis is not seen. Megamitochondria, and other inclusions are not seen in hepatocytes. Iron stain shows no stainable iron. PASD stain is negative for diastase resistant inclusions in hepatocytes. 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  12/02/2018 Hep B surface ab non-reactive. Hepatitis C ab non-reactive   CBC Latest Ref Rng & Units 08/30/2019 04/27/2019 04/06/2019  WBC 4.0 - 10.5 K/uL 4.2 4.4 2.8(L)  Hemoglobin 13.0 - 17.0 g/dL 16.0 15.3 14.1  Hematocrit 39.0 - 52.0 % 46.3 44.1 41.1  Platelets 150 - 400 K/uL 105(L) 114(L) 79(L)   Iron/TIBC/Ferritin/ %Sat    Component Value Date/Time   IRON 134 08/30/2019 1017   TIBC 393 08/30/2019 1017   FERRITIN 68 08/30/2019 1017   IRONPCTSAT 34 08/30/2019 1017   IRONPCTSAT 18 (L) 12/02/2018 0942   CMP Latest Ref Rng & Units 08/30/2019 04/27/2019 04/06/2019  Glucose 70 - 99 mg/dL 167(H) 104(H) 260(H)  BUN 8 - 23 mg/dL 21 17 19   Creatinine 0.61 - 1.24 mg/dL 1.13 0.97 1.07  Sodium 135 - 145 mmol/L 139 139 136  Potassium 3.5 - 5.1 mmol/L 4.0 3.6 4.2  Chloride 98 - 111 mmol/L 104 107 106  CO2 22 - 32 mmol/L 24 24 21(L)  Calcium 8.9 - 10.3 mg/dL 9.7 9.1 9.1  Total Protein 6.5 - 8.1 g/dL 7.7 7.4 -  Total Bilirubin 0.3 - 1.2 mg/dL 0.6 0.7 -  Alkaline Phos 38 - 126 U/L 44 50 -  AST 15 - 41 U/L 35 25 -  ALT 0 - 44 U/L 29 28 -  INR 1.1 on 12/02/2018  Past Medical History:  Diagnosis Date  . Anxiety   . Arthritis   . Asthma   . Cataract    Right eye  . Chronic lower back pain   . Coronary atherosclerosis of native coronary artery    a. LAD & OM stenting;  b. 2007 Cath:  nonobs dzs, patent stents;  c. 07/2012 neg MV, EF 61%.  d. 11/20/14: [place] s/p  PCI w/ DES to pLAD and DES to OM1   . Essential hypertension, benign   . GERD (gastroesophageal reflux disease)   . Hearing loss of left ear   . History of gout   . History of hiatal hernia   . Hypercholesteremia   . Lumbar herniated disc   . MI, old   . Migraine   . OSA on CPAP   . 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 Thrombocytopenia ([place])   . TIA (transient ischemic attack) ~ 2010  . Type 2 diabetes mellitus ([place])    Current Outpatient Medications on File Prior to Visit  Medication Sig Dispense Refill  . albuterol (PROVENTIL HFA;VENTOLIN HFA) 108 (90 BASE) MCG/ACT inhaler Inhale 2 puffs into the lungs every 6 (six) hours as needed for wheezing or shortness of breath.    [name] amLODipine (NORVASC) 5 MG tablet Take 5 mg by mouth at bedtime.  4  . aspirin EC 81 MG tablet Take 81 mg by mouth at bedtime.     . citalopram (CELEXA) 20 MG tablet Take 20 mg by mouth at bedtime.     . Cyanocobalamin (VITAMIN B-12 PO) Take 1,200 mg by mouth daily.    . diazepam (VALIUM) 5 MG tablet Take 5 mg by mouth every 6 (six) hours as needed for anxiety.    . ergocalciferol (VITAMIN D2) 1.25 MG (50000 UT) capsule Take 1 capsule (50,000 Units total) by mouth once a week. 20 capsule 1  . ezetimibe (ZETIA) 10 MG tablet Take 10 mg by mouth daily.    . fluticasone (FLONASE) 50 MCG/ACT nasal spray Place 1 spray into the nose 2 (two) times daily.     . Fluticasone-Salmeterol (ADVAIR) 250-50 MCG/DOSE AEPB Inhale 1 puff into the lungs every 12 (twelve) hours.    . gabapentin (NEURONTIN) 300 MG capsule Take 300 mg by mouth 3 (three) times daily.  2  . insulin aspart (NOVOLOG FLEXPEN) 100 UNIT/ML FlexPen Inject 2-30 Units into the skin 3 (three) times daily as needed for high blood sugar (CBG >150). Per sliding scale written down at home     . isosorbide mononitrate (IMDUR) 30  MG 24 hr tablet Take 30 mg by mouth at bedtime.  4  . Melatonin 5 MG TABS Take 10 mg by mouth at bedtime.     . metFORMIN (GLUCOPHAGE) 1000 MG tablet Take 1 tablet (1,000 mg total) by mouth 2 (two) times daily. For the next 2 days as you did receive IV contrast, then resume on 11/26/2018 60 tablet 11  . metoprolol tartrate (LOPRESSOR) 50 MG tablet Take 50 mg by mouth 2 (two) times daily.     [name] omeprazole (PRILOSEC) 20 MG capsule Take 20 mg by mouth  2 (two) times daily before a meal.    . pravastatin (PRAVACHOL) 20 MG tablet Take 1 tablet (20 mg total) by mouth daily. 10 tablet 0  . topiramate (TOPAMAX) 25 MG capsule Take 50 mg by mouth 2 (two) times daily.     [name] FLEXTOUCH 100 UNIT/ML SOPN FlexTouch Pen Inject 70 Units into the skin at bedtime.   5  . nitroGLYCERIN (NITROSTAT) 0.4 MG SL tablet Place 1 tablet (0.4 mg total) under the tongue every 5 (five) minutes x 3 doses as needed for chest pain (if no relief after 3rd dose, proceed to the ED for an evaluation or call 911). (Patient not taking: Reported on 09/06/2019) 25 tablet 3   No current facility-administered medications on file prior to visit.    Allergies  Allergen Reactions  . Divalproex Sodium Other (See Comments)    Causes anger  . Imdur [Isosorbide Dinitrate] Other (See Comments)    Severe headache (trying lower dose 07/05/18)  . Statins Other (See Comments)    Muscle aches and cramps  . Tramadol Other (See Comments)    Chest pain   . Valproic Acid Other (See Comments)    "anger"  . Gadolinium Derivatives Nausea And Vomiting    07/25/19 Pt vomited immediately after IV gad. Denies itching, dyspnea.  (Adverse, not allergic, reaction  . Tricor [Fenofibrate] Other (See Comments)    Leg cramps   Review of Systems     Objective:   Physical Exam BP 123/77   Pulse 65   Temp 98.5 F (36.9 C) (Oral)   Ht 5' 9"  (1.753 m)   Wt 236 lb 4.8 oz (107.2 kg)   BMI 34.90 kg/m  General: 63 year old male well-developed in no acute  distress Eyes: Sclera nonicteric, conjunctiva pink Mouth: Poor dentition, no ulcers or lesions Neck: Supple, no lymphadenopathy Heart: Regular rate and rhythm, no murmurs Lungs: Breath sounds clear throughout. Mediastinal scar intact.  Abdomen: Soft, nontender, no masses or organomegaly, no ascites, + BS x 4 quads. Extremities: No edema Neuro: Alert and oriented x4, no focal deficits    Assessment & Plan:   84.  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  GERD and dysphagia -EGD as ordered in # 1 -Continue Omeprazole 55m po bid  3.  Iron deficiency anemia multifactorial. rule out GI blood loss/malignant process. Cirrhosis with splenomegaly.  -EGD and colonoscopy benefits and risks discussed including risk with sedation, risk of bleeding, perforation and infection.  -Celiac panel -Continue follow up with Dr. KDelton Coombes  4.  Thrombocytopenia secondary to cirrhosis   Further follow-up to be determined after the above evaluation completed

## 2019-09-11 NOTE — Telephone Encounter (Signed)
I called and spoke to PETE, at [facility] and the request is for outpt therapy.  I relayed to him that last order placed was 08-30-19 was to neurorehab for PT/vestibular therapy.  Laurey Arrow, PT was attempting [facility] (vis pcp order) but pt noncompliant and was felt that he would do better outpt.  I gave him the [phone] to call or give to pt to see if they can get scheduled. He appreciated call back.

## 2019-09-13 ENCOUNTER — Encounter (INDEPENDENT_AMBULATORY_CARE_PROVIDER_SITE_OTHER): Payer: Self-pay | Admitting: *Deleted

## 2019-09-13 ENCOUNTER — Encounter (INDEPENDENT_AMBULATORY_CARE_PROVIDER_SITE_OTHER): Payer: Self-pay | Admitting: Nurse Practitioner

## 2019-09-13 ENCOUNTER — Other Ambulatory Visit: Payer: Self-pay

## 2019-09-13 ENCOUNTER — Ambulatory Visit (INDEPENDENT_AMBULATORY_CARE_PROVIDER_SITE_OTHER): Payer: Medicare Other | Admitting: Nurse Practitioner

## 2019-09-13 VITALS — BP 123/77 | HR 65 | Temp 98.5°F | Ht 69.0 in | Wt 236.3 lb

## 2019-09-13 DIAGNOSIS — K219 Gastro-esophageal reflux disease without esophagitis: Secondary | ICD-10-CM | POA: Diagnosis not present

## 2019-09-13 DIAGNOSIS — R1310 Dysphagia, unspecified: Secondary | ICD-10-CM | POA: Insufficient documentation

## 2019-09-13 DIAGNOSIS — D508 Other iron deficiency anemias: Secondary | ICD-10-CM

## 2019-09-13 DIAGNOSIS — K7460 Unspecified cirrhosis of liver: Secondary | ICD-10-CM | POA: Insufficient documentation

## 2019-09-13 DIAGNOSIS — Z1211 Encounter for screening for malignant neoplasm of colon: Secondary | ICD-10-CM | POA: Diagnosis not present

## 2019-09-13 DIAGNOSIS — R1312 Dysphagia, oropharyngeal phase: Secondary | ICD-10-CM

## 2019-09-13 DIAGNOSIS — I6330 Cerebral infarction due to thrombosis of unspecified cerebral artery: Secondary | ICD-10-CM

## 2019-09-13 DIAGNOSIS — K7581 Nonalcoholic steatohepatitis (NASH): Secondary | ICD-10-CM | POA: Insufficient documentation

## 2019-09-13 NOTE — Patient Instructions (Signed)
1.  Schedule an EGD and colonoscopy 2.  Schedule an abdominal ultrasound 3.  Complete the provided lab order 4.  Proceed with ENT evaluation 5.  Further follow-up to be determined after the above evaluation completed

## 2019-09-20 LAB — HEPATITIS B SURFACE ANTIBODY,QUALITATIVE: Hep B S Ab: NONREACTIVE

## 2019-09-20 LAB — GLIADIN ANTIBODIES, SERUM
Gliadin IgA: 13 Units
Gliadin IgG: 2 Units

## 2019-09-20 LAB — HEPATITIS B CORE ANTIBODY, TOTAL: Hep B Core Total Ab: NONREACTIVE

## 2019-09-20 LAB — RETICULIN ANTIBODIES, IGA W TITER: Reticulin IGA Screen: NEGATIVE

## 2019-09-20 LAB — CERULOPLASMIN: Ceruloplasmin: 28 mg/dL (ref 18–36)

## 2019-09-20 LAB — TISSUE TRANSGLUTAMINASE, IGA: (tTG) Ab, IgA: 1 U/mL

## 2019-09-20 LAB — ANTI-SMOOTH MUSCLE ANTIBODY, IGG: Actin (Smooth Muscle) Antibody (IGG): <20 U (ref <20)

## 2019-09-20 LAB — ALPHA-1-ANTITRYPSIN: A-1 Antitrypsin, Ser: 143 mg/dL (ref 83–199)

## 2019-09-20 LAB — HEPATITIS A ANTIBODY, TOTAL: Hepatitis A AB,Total: NONREACTIVE

## 2019-09-20 LAB — AFP TUMOR MARKER: AFP-Tumor Marker: 2.3 ng/mL (ref <6.1)

## 2019-09-20 LAB — MITOCHONDRIAL ANTIBODIES: Mitochondrial M2 Ab, IgG: <=20 U

## 2019-09-21 ENCOUNTER — Ambulatory Visit (HOSPITAL_COMMUNITY): Admission: RE | Admit: 2019-09-21 | Payer: Medicare Other | Source: Ambulatory Visit

## 2019-09-22 ENCOUNTER — Other Ambulatory Visit (INDEPENDENT_AMBULATORY_CARE_PROVIDER_SITE_OTHER): Payer: Self-pay | Admitting: *Deleted

## 2019-09-22 DIAGNOSIS — D508 Other iron deficiency anemias: Secondary | ICD-10-CM

## 2019-09-22 DIAGNOSIS — K7460 Unspecified cirrhosis of liver: Secondary | ICD-10-CM

## 2019-09-22 DIAGNOSIS — K7581 Nonalcoholic steatohepatitis (NASH): Secondary | ICD-10-CM

## 2019-09-26 ENCOUNTER — Other Ambulatory Visit: Payer: Self-pay

## 2019-09-26 ENCOUNTER — Other Ambulatory Visit (HOSPITAL_COMMUNITY)
Admission: RE | Admit: 2019-09-26 | Discharge: 2019-09-26 | Disposition: A | Discharge status: Home / Self Care | Payer: Medicare Other | Source: Ambulatory Visit | Attending: Neurology | Admitting: Neurology

## 2019-09-26 DIAGNOSIS — Z20828 Contact with and (suspected) exposure to other viral communicable diseases: Secondary | ICD-10-CM | POA: Insufficient documentation

## 2019-09-26 DIAGNOSIS — Z01812 Encounter for preprocedural laboratory examination: Secondary | ICD-10-CM | POA: Diagnosis present

## 2019-09-26 LAB — SARS CORONAVIRUS 2 (TAT 6-24 HRS): SARS Coronavirus 2: NEGATIVE

## 2019-09-28 ENCOUNTER — Ambulatory Visit (INDEPENDENT_AMBULATORY_CARE_PROVIDER_SITE_OTHER): Payer: Medicare Other | Admitting: Neurology

## 2019-09-28 ENCOUNTER — Other Ambulatory Visit: Payer: Self-pay

## 2019-09-28 DIAGNOSIS — G4731 Primary central sleep apnea: Secondary | ICD-10-CM

## 2019-09-28 DIAGNOSIS — G4733 Obstructive sleep apnea (adult) (pediatric): Secondary | ICD-10-CM | POA: Diagnosis not present

## 2019-09-28 DIAGNOSIS — Z9989 Dependence on other enabling machines and devices: Secondary | ICD-10-CM

## 2019-09-28 DIAGNOSIS — R2990 Unspecified symptoms and signs involving the nervous system: Secondary | ICD-10-CM

## 2019-09-28 DIAGNOSIS — G4720 Circadian rhythm sleep disorder, unspecified type: Secondary | ICD-10-CM

## 2019-09-28 DIAGNOSIS — G4761 Periodic limb movement disorder: Secondary | ICD-10-CM

## 2019-09-28 DIAGNOSIS — G4719 Other hypersomnia: Secondary | ICD-10-CM

## 2019-09-28 DIAGNOSIS — E669 Obesity, unspecified: Secondary | ICD-10-CM

## 2019-10-02 ENCOUNTER — Ambulatory Visit (HOSPITAL_COMMUNITY): Admission: RE | Admit: 2019-10-02 | Payer: Medicare Other | Source: Ambulatory Visit

## 2019-10-12 NOTE — Progress Notes (Signed)
Patient referred by Frann Rider, seen by me on 08/03/19, diagnostic PSG on 08/27/19. Patient had a CPAP titration study on 09/28/19.  Please call and inform patient that I have entered an order for treatment with positive airway pressure (PAP) treatment for obstructive sleep apnea (OSA). The study was not full successful in treating his severe sleep apnea, but he did the best on BiPAP ST at a pressure of 20/15 cm with a back up rate of 12/min. We will, therefore, arrange for a machine for home use through a DME (durable medical equipment) company of His choice; and I will see the patient back in follow-up in about 10 weeks. Please also explain to the patient that I will be looking out for compliance data, which can be downloaded from the machine (stored on an SD card, that is inserted in the machine) or via remote access through a modem, that is built into the machine. At the time of the followup appointment we will discuss sleep study results and how it is going with PAP treatment at home. Please advise patient to bring His machine at the time of the first FU visit, even though this is cumbersome. Bringing the machine for every visit after that will likely not be needed, but often helps for the first visit to troubleshoot if needed. Please re-enforce the importance of compliance with treatment and the need for [place] to monitor compliance data - often an insurance requirement and actually good feedback for the patient as far as how they are doing.  Also remind patient, that any interim PAP machine or mask issues should be first addressed with the DME company, as they can often help better with technical and mask fit issues. Please ask if patient has a preference regarding DME company.  Please also make sure, the patient has a follow-up appointment with me in about 10 weeks from the setup date, thanks. May see one of our nurse practitioners if needed for proper timing of the FU appointment.  Please fax or rout  report to the referring provider. Thanks,   Star Age, MD, PhD [facility])

## 2019-10-12 NOTE — Addendum Note (Signed)
Addended by: Star Age on: 10/12/2019 06:54 PM   Modules accepted: Orders

## 2019-10-12 NOTE — Procedures (Signed)
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 His baseline sleep study from 08/27/19 showed severe OSA with an AHI of 43.7/hour, REM AHI of 78.5 /hour, and O2 nadir of 80%. The patient endorsed the Epworth Sleepiness Scale at 14 points. The patient's weight 238 pounds with a height of 69 (inches), resulting in a BMI of 35.3 kg/m2. The patient's neck circumference measured 17.2 inches.  CURRENT MEDICATIONS: Proventil, Norvasc, Celexa, Valium, Vitamin D2, Flonase, Advair, Novolog, Imdur, Glucophage, Lopressor, Prilosec, Pravachol, Topamax, Nitrostat.  PROCEDURE:  This is a multichannel digital polysomnogram utilizing the SomnoStar 11.2 system.  Electrodes and sensors were applied and monitored per AASM Specifications.   EEG, EOG, Chin and Limb EMG, were sampled at 200 Hz.  ECG, Snore and Nasal Pressure, Thermal Airflow, Respiratory Effort, CPAP Flow and Pressure, Oximetry was sampled at 50 Hz. Digital video and audio were recorded.      The patient was fitted with a small FFM, simplus. CPAP was initiated at 5 cm H20, but increased to 7 cm for patient comfort, pressure was advanced to CPAP of 13 cm, but had to be increased further. For patient comfort, he was switched to BiPAP of 18/14. He had an AHI of 75.6/hour on standard BiPAP, with central apneas noted. He was therefore started on BiPAP ST with a backup rate of 12/min and titrated to a pressure of  20/16cmH20, rate = 12 because of hypopneas, apneas and desaturations  and ongoing central events. At a BiPAP ST pressure of 20/15, rate = 12 cmH20, there was a reduction of the AHI to 10.3/hour, with supine NREM sleep achieved and O2 nadir of 93%.   Lights Out was at 21:47 and Lights On at 04:51. Total recording time (TRT) was 425 minutes, with a total sleep time (TST) of 313.5 minutes. The patient's sleep latency was 11.5 minutes. REM sleep was absent. The sleep efficiency was 73.8 %.    SLEEP ARCHITECTURE: WASO (Wake after sleep onset)  was 100 minutes with mild to moderate sleep fragmentation noted. There were 41.5 minutes in Stage N1, 272 minutes Stage N2, 0 minutes Stage N3 and 0 minutes in Stage REM.  The percentage of Stage N1 was 13.2%, which is increased, Stage N2 was 86.8%, which is markedly increased, Stage N3 and Stage R (REM sleep) were absent. The arousals were noted as: 96 were spontaneous, 0 were associated with PLMs, 99 were associated with respiratory events.  RESPIRATORY ANALYSIS:  There was a total of 299 respiratory events: 134 obstructive apneas, 96 central apneas and 1 mixed apneas with a total of 231 apneas and an apnea index (AI) of 44.2 /hour. There were 68 hypopneas with a hypopnea index of 13./hour. The patient also had 0 respiratory event related arousals (RERAs).      The total APNEA/HYPOPNEA INDEX  (AHI) was 57.2 /hour and the total RESPIRATORY DISTURBANCE INDEX was 57.2 /hour  0 events occurred in REM sleep and 299 events in NREM. The REM AHI was 0 /hour versus a non-REM AHI  of 57.2 /hour.  The patient spent 239.5 minutes of total sleep time in the supine position and 74 minutes in non-supine. The supine AHI was 58.6, versus a non-supine AHI of 52.7.  OXYGEN SATURATION & C02:  The baseline 02 saturation was 96%, with the lowest being 90%. Time spent below 89% saturation equaled 0 minutes.  PERIODIC LIMB MOVEMENTS:  The patient had a total of 0 Periodic Limb Movements. The Periodic Limb Movement (PLM) index was 0 and the PLM Arousal index was  0 /hour.  Audio and video analysis did not show any abnormal or unusual movements, behaviors, phonations or vocalizations. The patient took 1 bathroom break. The EKG was in keeping with normal sinus rhythm (NSR) with rare PVCs noted.  Post-study, the patient indicated that sleep was better than usual.   IMPRESSION: 1. Severe Obstructive Sleep Apnea (OSA) 2. Central sleep apnea 3. Insufficient treatment with CPAP 4. Dysfunctions associated with sleep stages or arousals from sleep    RECOMMENDATIONS: 1. This was a difficult titration with insufficient treatment with CPAP and BiPAP. BiPAP ST improved his obstructive and central sleep disordered breathing with no REM sleep achieved and residual AHI of 10.3/hour on a pressure of 20/15 cm with a rate of 12/min. I will recommend home BiPAP ST, but he may need a designated BiPAP titration study with BiPAP ST vs. ASV for full resolution of his complex sleep apnea. The patient should be reminded to be fully compliant with PAP therapy to improve sleep related symptoms and decrease long term cardiovascular risks. The patient should be reminded, that it may take up to 3 months to get fully used to using PAP with all planned sleep. The earlier full compliance is achieved, the better long term compliance tends to be. Please note that untreated obstructive sleep apnea may carry additional perioperative morbidity. Patients with significant obstructive sleep apnea should receive perioperative PAP therapy and the surgeons and particularly the anesthesiologist should be informed of the diagnosis and the severity of the sleep disordered breathing. 2. This study shows sleep fragmentation and abnormal sleep stage percentages; these are nonspecific findings and per se do not signify an intrinsic sleep disorder or a cause for the patient's sleep-related symptoms. Causes include (but are not limited to) the first night effect of the sleep study, circadian rhythm disturbances,  medication effect or an underlying mood disorder or medical problem.  3. The patient should be cautioned not to drive, work at heights, or operate dangerous or heavy equipment when tired or sleepy. Review and reiteration of good sleep hygiene measures should be pursued with any patient. 4. The patient will be seen in follow-up in the sleep clinic at [facility] for discussion of the test results, symptom and treatment compliance review, further management strategies, etc. The referring provider will be notified of the test results.   I certify that I have reviewed the entire raw data recording prior to the issuance of this report in accordance with the Standards of Accreditation of the American Academy of Sleep Medicine (AASM)   Star Age, MD, PhD Diplomat, American Board of Neurology and Sleep Medicine (Neurology and Sleep Medicine)

## 2019-10-13 ENCOUNTER — Ambulatory Visit: Payer: Medicare Other | Attending: Adult Health

## 2019-10-13 ENCOUNTER — Other Ambulatory Visit: Payer: Self-pay

## 2019-10-13 DIAGNOSIS — R2681 Unsteadiness on feet: Secondary | ICD-10-CM | POA: Diagnosis present

## 2019-10-13 DIAGNOSIS — R2689 Other abnormalities of gait and mobility: Secondary | ICD-10-CM | POA: Diagnosis present

## 2019-10-13 DIAGNOSIS — R209 Unspecified disturbances of skin sensation: Secondary | ICD-10-CM | POA: Diagnosis present

## 2019-10-13 DIAGNOSIS — R42 Dizziness and giddiness: Secondary | ICD-10-CM | POA: Diagnosis present

## 2019-10-13 DIAGNOSIS — R208 Other disturbances of skin sensation: Secondary | ICD-10-CM

## 2019-10-13 NOTE — Therapy (Signed)
[facility] [address] [place], [place], [address] Phone: [phone]   Fax:  [phone]  Physical Therapy Evaluation  Patient Details  Name: [name] [name] MRN: [id number] Date of Birth: [birth year] Referring Provider (PT): Frann Rider, NP   Encounter Date: 10/13/2019  PT End of Session - 10/13/19 1306    Visit Number  1    Number of Visits  17    Date for PT Re-Evaluation  01/11/20    Authorization Type  Medicare: progress note every 10th visit.    PT Start Time  1147    PT Stop Time  1233    PT Time Calculation (min)  46 min    Equipment Utilized During Treatment  Other (comment)   min guard to S prn   Activity Tolerance  Patient tolerated treatment well;Other (comment)   pt did require rest 2/2 nausea and dizziness during examination.   Behavior During Therapy  [facility] for tasks assessed/performed       Past Medical History:  Diagnosis Date  . Anxiety   . Arthritis   . Asthma   . Cataract    Right eye  . Chronic lower back pain   . Coronary atherosclerosis of native coronary artery    a. LAD & OM stenting;  b. 2007 Cath: nonobs dzs, patent stents;  c. 07/2012 neg MV, EF 61%.  d. 11/20/14: [place] s/p  PCI w/ DES to pLAD and DES to OM1   . Essential hypertension, benign   . GERD (gastroesophageal reflux disease)   . Hearing loss of left ear   . History of gout   . History of hiatal hernia   . Hypercholesteremia   . Lumbar herniated disc   . MI, old   . Migraine   . OSA on CPAP   . 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 Thrombocytopenia ([place])   . TIA (transient ischemic attack) ~ 2010  . Type 2 diabetes mellitus ([place])     Past Surgical History:  Procedure Laterality Date  . ANTERIOR CERVICAL DECOMP/DISCECTOMY FUSION  1998   "C3-4"  . CARDIAC CATHETERIZATION  "several"  . CARDIAC CATHETERIZATION N/A 12/30/2015   Procedure: Left Heart  Cath and Coronary Angiography;  Surgeon: [name], MD;  Location: [place] CV LAB;  Service: Cardiovascular;  Laterality: N/A;  . CARPAL TUNNEL RELEASE Bilateral 2005  . CHOLECYSTECTOMY N/A 11/28/2018   Procedure: LAPAROSCOPIC CHOLECYSTECTOMY;  Surgeon: [name], MD;  Location: [facility];  Service: General;  Laterality: N/A;  . COLONOSCOPY    . CORONARY ANGIOPLASTY    . CORONARY ANGIOPLASTY WITH STENT PLACEMENT  2002; 2003; 11/20/2014   "I have 4 stents after today" (11/20/2014)  . CORONARY ARTERY BYPASS GRAFT N/A 01/09/2016   Procedure: CORONARY ARTERY BYPASS GRAFTING (CABG) X 3 UTILIZING RIGHT AND LEFT INTERNAL MAMMARY ARTERY AND ENDOSCOPICALLY HARVESTED SAPHENEOUS VEIN.;  Surgeon: Rexene Alberts, MD;  Location: [place];  Service: Open Heart Surgery;  Laterality: N/A;  . ESOPHAGOGASTRODUODENOSCOPY    . KNEE SURGERY Left 02/2012   "scraped; open"  . LEFT HEART CATH AND CORS/GRAFTS ANGIOGRAPHY N/A 08/09/2017   Procedure: LEFT HEART CATH AND CORS/GRAFTS ANGIOGRAPHY;  Surgeon: [name], MD;  Location: [place] CV [facility];  Service: Cardiovascular;  Laterality: N/A;  . LEFT HEART CATH AND CORS/GRAFTS ANGIOGRAPHY N/A 11/22/2018   Procedure: LEFT HEART CATH  AND CORS/GRAFTS ANGIOGRAPHY;  Surgeon: [name], [name], MD;  Location: [facility] CV LAB;  Service: Cardiovascular;  Laterality: N/A;  . LEFT HEART CATHETERIZATION WITH CORONARY ANGIOGRAM N/A 07/20/2012   Procedure: LEFT HEART CATHETERIZATION WITH CORONARY ANGIOGRAM;  Surgeon: [name], MD;  Location: [facility] CATH LAB;  Service: Cardiovascular;  Laterality: N/A;  . LEFT HEART CATHETERIZATION WITH CORONARY ANGIOGRAM N/A 11/20/2014   Procedure: LEFT HEART CATHETERIZATION WITH CORONARY ANGIOGRAM;  Surgeon: [name], MD;  Location: [facility] CATH LAB;  Service: Cardiovascular;  Laterality: N/A;  . LEFT HEART CATHETERIZATION WITH CORONARY ANGIOGRAM N/A 11/26/2014   Procedure: LEFT HEART CATHETERIZATION WITH CORONARY ANGIOGRAM;   Surgeon: [name], MD;  Location: [facility] CATH LAB;  Service: Cardiovascular;  Laterality: N/A;  . NEUROPLASTY / TRANSPOSITION ULNAR NERVE AT ELBOW Right ~ 2012  . PERCUTANEOUS CORONARY ROTOBLATOR INTERVENTION (PCI-R)  11/20/2014   Procedure: PERCUTANEOUS CORONARY ROTOBLATOR INTERVENTION (PCI-R);  Surgeon: [name], MD;  Location: [facility] CATH LAB;  Service: Cardiovascular;;  . SHOULDER ARTHROSCOPY Left ~ 2011  . TEE WITHOUT CARDIOVERSION N/A 01/09/2016   Procedure: TRANSESOPHAGEAL ECHOCARDIOGRAM (TEE);  Surgeon: Rexene Alberts, MD;  Location: [facility];  Service: Open Heart Surgery;  Laterality: N/A;    There were no vitals filed for this visit.   Subjective Assessment - 10/13/19 1154    Subjective  Pt reported dizziness began in 06/2019, he woke up and the room was spinning. Pt reported dizziness is now intermittent and the episodes don't last long but feels woozy afterwards. Pt reported room still spins but not as bad. Pt reported movement, standing, and turning in bed all incr. dizziness. At worst: 10/10, at best: 0/10, but lately the worst dizziness is 5/10. Pt went to [facility] and had MRI and carotid doppler, imaging was negative and carotid stenosis noted 1-39% per notes. Pt has referral to [facility] but has not heard from them. Pt reported diplopia, blurred vision begins before dizziness. Pt has tinnitus in L ear but denied weakness or severe pain. Pt reported about 6-7 falls in last six months.    Pertinent History  TIA, OSA-waiting CPAP, B cataracts (extraction completed), DM, HLD, MI-CAD s/p CABG, CHF, anxiety, asthma, DDD of Cx spine, L ear hearing loss, R great toe gout, hx of migraines    Patient Stated Goals  Figure out why he's dizzy and stop it or find out a way to live with it.    Currently in Pain?  Yes    Pain Score  1     Pain Location  Head    Pain Orientation  --   top portion of head, traveling to posterior head   Pain Descriptors / Indicators  Headache    Pain Type  Acute  pain    Pain Onset  Today    Pain Frequency  Constant    Aggravating Factors   light    Pain Relieving Factors  darker         [facility] PT Assessment - 10/13/19 1202      Assessment   Medical Diagnosis  Vertigo    Referring Provider (PT)  Frann Rider, NP    Onset Date/Surgical Date  06/27/19    Hand Dominance  Right    Prior Therapy  none for vertigo      Precautions   Precautions  Fall      Restrictions   Weight Bearing Restrictions  No      Balance Screen   Has the  patient fallen in the past 6 months  Yes    How many times?  6-7    Has the patient had a decrease in activity level because of a fear of falling?   Yes    Is the patient reluctant to leave their home because of a fear of falling?   Yes      Home Environment   Living Environment  Private residence    Living Arrangements  Spouse/significant other;Children   two sons: 40 and 76 years old   Available Help at Discharge  Family;Available 24 hours/day    Type of Home  House    Home Access  Stairs to enter    Entrance Stairs-Number of Steps  5   at back and 4 out front   Entrance Stairs-Rails  Right   on back steps and wall on left, no handrail out front   Home Layout  One level    [place] - single point;Shower seat;Grab bars - tub/shower;Wheelchair - manual      Prior Function   Level of Independence  Independent    Vocation  On disability    Leisure  Travel      Cognition   Overall Cognitive Status  Within Functional Limits for tasks assessed   but pt reports family states he's confused when dizzy     Sensation   Light Touch  Impaired by gross assessment    Additional Comments  Pt reported N/T constant s/p CABG procedure with vein grafts (BLE and L chest area). However, pt's N/T distribution consistent with peripheral neuropathy (from knees down to feet)      Ambulation/Gait   Ambulation/Gait  Yes    Ambulation/Gait Assistance  5: Supervision    Ambulation/Gait Assistance Details  S for  safety.    Ambulation Distance (Feet)  100 Feet    Assistive device  None    Gait Pattern  Step-through pattern;Decreased arm swing - right;Decreased arm swing - left;Decreased stride length;Decreased trunk rotation;Wide base of support    Ambulation Surface  Level;Indoor    Gait velocity  2.63f/sec. no AD    Gait Comments  Pt amb. in guarded manner.            Vestibular Assessment - 10/13/19 1212      Symptom Behavior   Subjective history of current problem  See subjective assessment.    Type of Dizziness   Spinning;Comment;Diplopia;Blurred vision;Unsteady with head/body turns   then woozy   Frequency of Dizziness  Intermittent, not daily    Duration of Dizziness  Minutes to hours.    Symptom Nature  Motion provoked    Aggravating Factors  Rolling to left;Rolling to right;Sit to stand;Supine to sit;Turning head quickly;Turning body quickly;Turning head sideways;Looking up to the ceiling    Relieving Factors  Rest;Slow movements;Head stationary    Progression of Symptoms  No change since onset    History of similar episodes  No hx of vertigo.      Oculomotor Exam   Oculomotor Alignment  Normal    Spontaneous  Absent    Gaze-induced   Absent    Smooth Pursuits  Intact   pt report nausea with smooth pursuits (L worse than R)   Saccades  Poor trajectory   with dizziness   Comment  Pt reported double vision from approx. 24 inches from nose.      Oculomotor Exam-Fixation Suppressed    Left Head Impulse   1-2 beats of saccadic movement  during     Right Head Impulse  Pt closed eyes 2/2 dizziness.       Vestibulo-Ocular Reflex   VOR 1 Head Only (x 1 viewing)  Pt was able to perform horizontal x1 viewing with slight wooziness reported but had to cease vertical x1 viewing and close eyes 2/2 dizziness.     VOR Cancellation  Normal      Visual Acuity   Static  Line 10    Dynamic  Line 3   with 5/10 dizziness reported.          Objective measurements completed on  examination: See above findings.       Vestibular Treatment/Exercise - 10/13/19 1302      Vestibular Treatment/Exercise   Vestibular Treatment Provided  Gaze    Gaze Exercises  X1 Viewing Horizontal;X1 Viewing Vertical      X1 Viewing Horizontal   Foot Position  seated    Time  --   5 reps vs. timed   Reps  5    Comments  Cues and demo for technique. PT had pt perform 5 reps vs. timed. Pt reported 4/10 dizziness.       X1 Viewing Vertical   Foot Position  seated    Time  --   5 reps   Reps  5    Comments  Cues and demo for technique. See HEP for details.             PT Education - 10/13/19 1305    Education Details  PT discussed exam findings, PT POC, duration, and frequency. PT provided pt with x1 viewing HEP and requested pt have someone drive him to/from appt's as we provoked dizziness today. PT encouraged pt to follow up with [facility], regarding referral made 08/30/19. PT educated pt to monitor dizziness after her receives CPAP, as that can sometimes improve dizziness.    Person(s) Educated  Patient    Methods  Explanation;Demonstration;Verbal cues;Handout    Comprehension  Returned demonstration;Verbalized understanding;Need further instruction       PT Short Term Goals - 10/13/19 1322      PT SHORT TERM GOAL #1   Title  Pt will be IND in HEP to improve dizzines, balance, and strength. TARGET DATE FOR ALL STG: 11/10/19    Time  4    Period  Weeks    Status  New      PT SHORT TERM GOAL #2   Title  Pt will improve gait speed to >/=2.53f/sec with LRAD to safely amb. in the community.    Time  4    Period  Weeks    Status  New      PT SHORT TERM GOAL #3   Title  Perform FGA and SOT, write goals as indicated.    Time  4    Period  Weeks    Status  New      PT SHORT TERM GOAL #4   Title  Perform positional testing and write goals as indicated.    Time  4    Period  Weeks    Status  New      PT SHORT TERM GOAL #5   Title  Pt will amb. 300'  over even terrain, IND, while performing head turns to improve functional mobility with dizziness <3/10.    Time  4    Period  Weeks        PT Long Term Goals - 10/13/19 1323  PT LONG TERM GOAL #1   Title  Pt will amb. 600' over uneven, paved surfaces at MOD I level while performing head turns with </=1/10 dizziness to improve functional mobility. TARGET DATE FOR ALL LTGS: 12/07/18    Time  8    Period  Weeks    Status  New      PT LONG TERM GOAL #2   Title  Pt will report no falls in last four weeks to improve safety.    Time  8    Period  Weeks    Status  New             Plan - 10/13/19 1307    Clinical Impression Statement  Pt is a pleasant 63y/o male presenting to [facility] neuro for vertigo. Pt's PMH is significant for the following: TIA, OSA-waiting CPAP, B cataracts (extraction completed), DM, HLD, MI-CAD s/p CABG, CHF, anxiety, asthma, DDD of Cx spine, L ear hearing loss, R great toe gout, hx of migraines. Pt's gait speed indicated pt is not able to safely amb. in the community. Pt's dizziness likely multi-factorial, as pt had + L HIT (unable to determine R HIT as pt closed eyes) and experienced an incr. in dizziness and significant line difference (3+) during DVA vs. SVA testing. These tests indicate pt likley has vestibular hypofunction, PT will assess for positional vertigo next session. Pt reported peripheral neuropathy in BLEs, knees traveling distally to feet. Pt experience diplopia from approx. 24 inches from nose, but eyes were able to adduct, and states this occurs with dizziness-imaging negative. Pt also has hx of migraines, making it likely that dizziness is multi-factorial. The following deficits were noted upon exam: gait deviations, impaired balance, dizziness, strength not formally assessed but weakness suspected 2/2 gait deviations, and impaired sensation. PT will perform SOT next session. Pt would benefit from skilled PT to improve safety during functional  mobility.    Personal Factors and Comorbidities  Comorbidity 3+;Past/Current Experience;Transportation    Comorbidities  TIA, OSA-waiting CPAP, B cataracts (extraction completed), DM, HLD, MI-CAD s/p CABG, CHF, anxiety, asthma, DDD of Cx spine, L ear hearing loss, R great toe gout, hx of migraines    Examination-Activity Limitations  Bed Mobility;Bend;Squat;Sit;Stairs;Carry;Stand;Transfers;Locomotion Level    Examination-Participation Restrictions  Yard Work;Driving;Other   unable to work at this time   Stability/Clinical Decision Making  Evolving/Moderate complexity    Clinical Decision Making  Moderate    Rehab Potential  Good    PT Frequency  2x / week    PT Duration  8 weeks    PT Treatment/Interventions  ADLs/Self Care Home Management;Canalith Repostioning;Biofeedback;Balance training;Therapeutic exercise;Therapeutic activities;Manual techniques;Vestibular;Functional mobility training;Stair training;Orthotic Fit/Training;Gait training;DME Instruction;Patient/family education;Neuromuscular re-education    PT Next Visit Plan  Perform SOT and FGA and write goals when applicable. Review x1 viewing in seated position and progress as tolerated. Assess for BPPV. Provide falls risk handout.    PT Home Exercise Plan  x1 viewing    Consulted and Agree with Plan of Care  Patient       Patient will benefit from skilled therapeutic intervention in order to improve the following deficits and impairments:  Abnormal gait, Impaired flexibility, Dizziness, Decreased range of motion, Decreased balance, Decreased mobility, Decreased knowledge of use of DME, Decreased strength, Impaired sensation  Visit Diagnosis: Dizziness and giddiness - Plan: PT plan of care cert/re-cert  Unsteadiness on feet - Plan: PT plan of care cert/re-cert  Other abnormalities of gait and mobility - Plan: PT plan of care  cert/re-cert  Other disturbances of skin sensation - Plan: PT plan of care cert/re-cert     Problem  List Patient Active Problem List   Diagnosis Date Noted  . Hepatic cirrhosis ([place]) 09/13/2019  . GERD (gastroesophageal reflux disease) 09/13/2019  . Dysphagia 09/13/2019  . DDD (degenerative disc disease), cervical 04/13/2019  . RUQ abdominal pain 11/28/2018  . Calculus of gallbladder with acute cholecystitis without obstruction   . Fatty liver   . Acute diastolic CHF (congestive heart failure) ([place]) 11/26/2018  . Right upper quadrant abdominal pain 11/26/2018  . Acute respiratory failure with hypoxia ([place]) 11/26/2018  . NASH (nonalcoholic steatohepatitis)   . Pancytopenia, acquired ([employer])   . Acute kidney injury ([place]) 11/20/2018  . Aphasia 07/09/2018  . Cerebral thrombosis with cerebral infarction 07/09/2018  . Chest pain 08/10/2017  . 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 Coronary artery disease involving native coronary artery with unstable angina pectoris ([place])   . CAD -S/P PCI LAD OM/LAD '13 and 2016 12/31/2015  . Hyperlipidemia   . Chest pain with high risk of acute coronary syndrome 12/23/2015  . Precordial chest pain 12/03/2014  . TIA (transient ischemic attack)   . CAD- diffuse LAD disease at cath 12/30/15   . Thrombocytopenia ([place])   . Fever 12/04/2013  . Accelerating angina ([place]) 12/03/2013  . Type 2 diabetes mellitus ([place])   . Anxiety   . PVC's (premature ventricular contractions) 01/06/2013  . Obstructive sleep apnea-declines C-pap 07/04/2012  . Mixed hyperlipidemia 07/30/2009  . Essential hypertension, benign 07/30/2009    Lauralei Clouse L 10/13/2019, 1:28 PM  [facility] [address] [place], [place], [address] Phone: [phone]   Fax:  [phone]  Name: YANCY KNOBLE MRN: [id number] Date of Birth: 03-Apr-1956  Geoffry Paradise, PT,DPT 10/13/19 1:29 PM Phone: [phone] Fax: [phone]

## 2019-10-13 NOTE — Patient Instructions (Signed)
Gaze Stabilization: Tip Card  1.Target must remain in focus, not blurry, and appear stationary while head is in motion. 2.Perform exercises with small head movements (45 to either side of midline). 3.Increase speed of head motion so long as target is in focus. 4.If you wear eyeglasses, be sure you can see target through lens (therapist will give specific instructions for bifocal / progressive lenses). 5.These exercises may provoke dizziness or nausea. Work through these symptoms. If too dizzy, slow head movement slightly. Rest between each exercise. Don't let dizziness or nausea go above 6/10. If it does, stop and rest, then try again. 6.Exercises demand concentration; avoid distractions. 7.For safety, perform standing exercises close to a counter, wall, corner, or next to someone.  Copyright  VHI. All rights reserved.   Gaze Stabilization: Sitting    Keeping eyes on target on wall 3-5 feet away, tilt head down 15-30 and move head side to side for __5__ reps. Repeat while moving head up and down for __5__ reps. Repeat two times. Do __3__ sessions per day.   Copyright  VHI. All rights reserved.

## 2019-10-16 ENCOUNTER — Telehealth: Payer: Self-pay

## 2019-10-16 NOTE — Telephone Encounter (Signed)
I called pt. I advised pt that Dr. Rexene Alberts reviewed their sleep study results and found that the study was not fully successful in treating his severe sleep apnea but he did best with a bipap ST. Dr. Rexene Alberts recommends that pt start a bipap at home. I reviewed PAP compliance expectations with the pt. Pt is agreeable to starting a BiPAP. I advised pt that an order will be sent to a DME, [facility], and [facility] will call the pt within about one week after they file with the pt's insurance. [facility] will show the pt how to use the machine, fit for masks, and troubleshoot the BiPAP if needed. A follow up appt was made for insurance purposes with Janett Billow, NP on 01/18/2020 at 12:45pm. Pt verbalized understanding to arrive 15 minutes early and bring their BiPAP. A letter with all of this information in it will be mailed to the pt as a reminder. I verified with the pt that the address we have on file is correct. Pt verbalized understanding of results. Pt had no questions at this time but was encouraged to call back if questions arise. I have sent the order to [facility] and have received confirmation that they have received the order.

## 2019-10-16 NOTE — Telephone Encounter (Signed)
-----   Message from Star Age, MD sent at 10/12/2019  6:54 PM EST ----- Patient referred by [name] [name], seen by me on 08/03/19, diagnostic PSG on 08/27/19. Patient had a CPAP titration study on 09/28/19.  Please call and inform patient that I have entered an order for treatment with positive airway pressure (PAP) treatment for obstructive sleep apnea (OSA). The study was not full successful in treating his severe sleep apnea, but he did the best on BiPAP ST at a pressure of 20/15 cm with a back up rate of 12/min. We will, therefore, arrange for a machine for home use through a DME (durable medical equipment) company of His choice; and I will see the patient back in follow-up in about 10 weeks. Please also explain to the patient that I will be looking out for compliance data, which can be downloaded from the machine (stored on an SD card, that is inserted in the machine) or via remote access through a modem, that is built into the machine. At the time of the followup appointment we will discuss sleep study results and how it is going with PAP treatment at home. Please advise patient to bring His machine at the time of the first FU visit, even though this is cumbersome. Bringing the machine for every visit after that will likely not be needed, but often helps for the first visit to troubleshoot if needed. Please re-enforce the importance of compliance with treatment and the need for [place] to monitor compliance data - often an insurance requirement and actually good feedback for the patient as far as how they are doing.  Also remind patient, that any interim PAP machine or mask issues should be first addressed with the DME company, as they can often help better with technical and mask fit issues. Please ask if patient has a preference regarding DME company.  Please also make sure, the patient has a follow-up appointment with me in about 10 weeks from the setup date, thanks. May see one of our nurse practitioners  if needed for proper timing of the FU appointment.  Please fax or rout report to the referring provider. Thanks,   Star Age, MD, PhD [facility])

## 2019-10-17 ENCOUNTER — Telehealth (INDEPENDENT_AMBULATORY_CARE_PROVIDER_SITE_OTHER): Payer: Medicare Other | Admitting: Cardiology

## 2019-10-17 ENCOUNTER — Encounter: Payer: Self-pay | Admitting: Cardiology

## 2019-10-17 VITALS — BP 117/69 | HR 69 | Ht 69.0 in | Wt 235.0 lb

## 2019-10-17 DIAGNOSIS — I10 Essential (primary) hypertension: Secondary | ICD-10-CM | POA: Diagnosis not present

## 2019-10-17 DIAGNOSIS — G4733 Obstructive sleep apnea (adult) (pediatric): Secondary | ICD-10-CM | POA: Diagnosis not present

## 2019-10-17 DIAGNOSIS — E782 Mixed hyperlipidemia: Secondary | ICD-10-CM | POA: Diagnosis not present

## 2019-10-17 DIAGNOSIS — I25119 Atherosclerotic heart disease of native coronary artery with unspecified angina pectoris: Secondary | ICD-10-CM

## 2019-10-17 NOTE — Progress Notes (Signed)
Virtual Visit via Telephone Note   This visit type was conducted due to national recommendations for restrictions regarding the COVID-19 Pandemic (e.g. social distancing) in an effort to limit this patient's exposure and mitigate transmission in our community.  Due to his co-morbid illnesses, this patient is at least at moderate risk for complications without adequate follow up.  This format is felt to be most appropriate for this patient at this time.  The patient did not have access to video technology/had technical difficulties with video requiring transitioning to audio format only (telephone).  All issues noted in this document were discussed and addressed.  No physical exam could be performed with this format.  Please refer to the patient's chart for his  consent to telehealth for [facility].  Date:  10/17/2019   ID:  [name], [name] [birth year], MRN [id number]  Patient Location: Home Provider Location: Office  PCP:  Terrial Rhodes, MD  Cardiologist:  Rozann Lesches, MD Electrophysiologist:  None   Evaluation Performed:  Follow-Up Visit  Chief Complaint:  Cardiac follow-up  History of Present Illness:    [name] [name] is a 63 y.o. male last assessed via telehealth encounter in June.  We spoke by phone today.  He does not report any progressive angina symptoms and states that he has been compliant with his regular medications.  He follows with Dr. Laural Golden, history of stage IV cirrhosis diagnosed at the time of cholecystectomy in January 2020, likely secondary to NASH.  He is chronically anemic and also thrombocytopenic.  I reviewed his lab work from October which is outlined below.  He recently underwent a sleep study, I reviewed the notes.  It was recommended that he start home BiPAP.  Current cardiac regimen includes aspirin, Norvasc, Zetia, Pravachol, Lopressor, Imdur, and as needed nitroglycerin.  The patient does not have symptoms concerning for COVID-19  infection (fever, chills, cough, or new shortness of breath).    Past Medical History:  Diagnosis Date  . Anxiety   . Arthritis   . Asthma   . Cataract    Right eye  . Chronic lower back pain   . Cirrhosis of liver ([place])   . Coronary atherosclerosis of native coronary artery    a. LAD & OM stenting;  b. 2007 Cath: nonobs dzs, patent stents;  c. 07/2012 neg MV, EF 61%.  d. 11/20/14: [place] s/p  PCI w/ DES to pLAD and DES to OM1   . Essential hypertension   . GERD (gastroesophageal reflux disease)   . Hearing loss of left ear   . History of gout   . History of hiatal hernia   . Hypercholesteremia   . Lumbar herniated disc   . MI, old   . Migraine   . OSA (obstructive sleep apnea)   . 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 Thrombocytopenia ([place])   . TIA (transient ischemic attack) ~ 2010  . Type 2 diabetes mellitus ([place])    Past Surgical History:  Procedure Laterality Date  . ANTERIOR CERVICAL DECOMP/DISCECTOMY FUSION  1998   "C3-4"  . CARDIAC CATHETERIZATION  "several"  . CARDIAC CATHETERIZATION N/A 12/30/2015   Procedure: Left Heart Cath and Coronary Angiography;  Surgeon: [name], MD;  Location: [place] [facility];  Service: Cardiovascular;  Laterality: N/A;  . CARPAL TUNNEL RELEASE Bilateral 2005  . CHOLECYSTECTOMY N/A 11/28/2018  Procedure: LAPAROSCOPIC CHOLECYSTECTOMY;  Surgeon: [name], MD;  Location: [facility];  Service: General;  Laterality: N/A;  . COLONOSCOPY    . CORONARY ANGIOPLASTY    . CORONARY ANGIOPLASTY WITH STENT PLACEMENT  2002; 2003; 11/20/2014   "I have 4 stents after today" (11/20/2014)  . CORONARY ARTERY BYPASS GRAFT N/A 01/09/2016   Procedure: CORONARY ARTERY BYPASS GRAFTING (CABG) X 3 UTILIZING RIGHT AND LEFT INTERNAL MAMMARY ARTERY AND ENDOSCOPICALLY HARVESTED SAPHENEOUS VEIN.;  Surgeon: Rexene Alberts, MD;  Location: [place];  Service: Open Heart Surgery;  Laterality: N/A;  .  ESOPHAGOGASTRODUODENOSCOPY    . KNEE SURGERY Left 02/2012   "scraped; open"  . LEFT HEART CATH AND CORS/GRAFTS ANGIOGRAPHY N/A 08/09/2017   Procedure: LEFT HEART CATH AND CORS/GRAFTS ANGIOGRAPHY;  Surgeon: [name], MD;  Location: [place] [facility];  Service: Cardiovascular;  Laterality: N/A;  . LEFT HEART CATH AND CORS/GRAFTS ANGIOGRAPHY N/A 11/22/2018   Procedure: LEFT HEART CATH AND CORS/GRAFTS ANGIOGRAPHY;  Surgeon: [name], [name], MD;  Location: [place] [facility];  Service: Cardiovascular;  Laterality: N/A;  . LEFT HEART CATHETERIZATION WITH CORONARY ANGIOGRAM N/A 07/20/2012   Procedure: LEFT HEART CATHETERIZATION WITH CORONARY ANGIOGRAM;  Surgeon: [name], MD;  Location: [place] [facility];  Service: Cardiovascular;  Laterality: N/A;  . LEFT HEART CATHETERIZATION WITH CORONARY ANGIOGRAM N/A 11/20/2014   Procedure: LEFT HEART CATHETERIZATION WITH CORONARY ANGIOGRAM;  Surgeon: [name], MD;  Location: [facility];  Service: Cardiovascular;  Laterality: N/A;  . LEFT HEART CATHETERIZATION WITH CORONARY ANGIOGRAM N/A 11/26/2014   Procedure: LEFT HEART CATHETERIZATION WITH CORONARY ANGIOGRAM;  Surgeon: [name], MD;  Location: [facility] CATH LAB;  Service: Cardiovascular;  Laterality: N/A;  . NEUROPLASTY / TRANSPOSITION ULNAR NERVE AT ELBOW Right ~ 2012  . PERCUTANEOUS CORONARY ROTOBLATOR INTERVENTION (PCI-R)  11/20/2014   Procedure: PERCUTANEOUS CORONARY ROTOBLATOR INTERVENTION (PCI-R);  Surgeon: [name], MD;  Location: [facility] CATH LAB;  Service: Cardiovascular;;  . SHOULDER ARTHROSCOPY Left ~ 2011  . TEE WITHOUT CARDIOVERSION N/A 01/09/2016   Procedure: TRANSESOPHAGEAL ECHOCARDIOGRAM (TEE);  Surgeon: Rexene Alberts, MD;  Location: [place];  Service: Open Heart Surgery;  Laterality: N/A;     Current Meds  Medication Sig  . albuterol (PROVENTIL HFA;VENTOLIN HFA) 108 (90 BASE) MCG/ACT inhaler Inhale 2 puffs into the lungs every 6 (six) hours as needed for wheezing or shortness of  breath.  [name] amLODipine (NORVASC) 5 MG tablet Take 5 mg by mouth at bedtime.  [name] aspirin EC 81 MG tablet Take 81 mg by mouth at bedtime.   . citalopram (CELEXA) 20 MG tablet Take 20 mg by mouth at bedtime.   . Cyanocobalamin (VITAMIN B-12 PO) Take 1,200 mg by mouth daily.  . diazepam (VALIUM) 5 MG tablet Take 5 mg by mouth every 6 (six) hours as needed for anxiety.  . ergocalciferol (VITAMIN D2) 1.25 MG (50000 UT) capsule Take 1 capsule (50,000 Units total) by mouth once a week.  . ezetimibe (ZETIA) 10 MG tablet Take 10 mg by mouth daily.  . fluticasone (FLONASE) 50 MCG/ACT nasal spray Place 1 spray into the nose 2 (two) times daily.   . Fluticasone-Salmeterol (ADVAIR) 250-50 MCG/DOSE AEPB Inhale 1 puff into the lungs every 12 (twelve) hours.  . furosemide (LASIX) 20 MG tablet Take 20 mg by mouth daily.  [name] gabapentin (NEURONTIN) 300 MG capsule Take 300 mg by mouth 3 (three) times daily.  . insulin aspart (NOVOLOG FLEXPEN) 100 UNIT/ML FlexPen Inject  2-30 Units into the skin 3 (three) times daily as needed for high blood sugar (CBG >150). Per sliding scale written down at home   . isosorbide mononitrate (IMDUR) 30 MG 24 hr tablet Take 30 mg by mouth at bedtime.  . Melatonin 5 MG TABS Take 10 mg by mouth at bedtime.   . metFORMIN (GLUCOPHAGE) 1000 MG tablet Take 1 tablet (1,000 mg total) by mouth 2 (two) times daily. For the next 2 days as you did receive IV contrast, then resume on 11/26/2018  . metoprolol tartrate (LOPRESSOR) 50 MG tablet Take 50 mg by mouth 2 (two) times daily.   . nitroGLYCERIN (NITROSTAT) 0.4 MG SL tablet Place 1 tablet (0.4 mg total) under the tongue every 5 (five) minutes x 3 doses as needed for chest pain (if no relief after 3rd dose, proceed to the ED for an evaluation or call 911).  [name] omeprazole (PRILOSEC) 20 MG capsule Take 20 mg by mouth 2 (two) times daily before a meal.  . pravastatin (PRAVACHOL) 20 MG tablet Take 1 tablet (20 mg total) by mouth daily.  [name] topiramate  (TOPAMAX) 25 MG capsule Take 50 mg by mouth 2 (two) times daily.   [name] FLEXTOUCH 100 UNIT/ML SOPN FlexTouch Pen Inject 70 Units into the skin at bedtime.      Allergies:   Divalproex sodium, Statins, Tramadol, Valproic acid, Gadolinium derivatives, and Tricor [fenofibrate]   Social History   Tobacco Use  . Smoking status: Never Smoker  . Smokeless tobacco: Never Used  Substance Use Topics  . Alcohol use: No  . Drug use: No     Family Hx: The patient's family history includes Asthma in his sister and sister; Autism in his son; Cancer in his paternal grandfather; Coronary artery disease (age of onset: 86) in his father; Heart attack in his paternal grandmother; Heart disease in his paternal uncle; Migraines in his son and son; Multiple sclerosis in his brother; Seizures in his son; Stroke in his maternal grandmother and mother.  ROS:   Please see the history of present illness.    Chronic fatigue. All other systems reviewed and are negative.   Prior CV studies:   The following studies were reviewed today:  Cardiac catheterization 11/22/2018:  Prox LAD to Mid LAD lesion is 90% stenosed.  Mid LAD lesion is 90% stenosed.  Ost LAD lesion is 50% stenosed.  1st Diag lesion is 80% stenosed.  Mid RCA lesion is 40% stenosed.  Dist RCA lesion is 40% stenosed.  Ost RPDA lesion is 100% stenosed.  SVG and is small.  LIMA and is small.  1st Mrg lesion is 99% stenosed.  Prox Cx lesion is 50% stenosed.  RIMA graft was visualized by angiography and is normal in caliber.  Origin to Prox Graft lesion is 40% stenosed.  LV end diastolic pressure is normal.  1. Severe 3 vessel obstructive CAD 2. Patent LIMA to the distal LAD 3. Patent free RIMA to the OM1 4. Patent SVG to PDA 5. Normal LVEDP  Compared to prior angiogram dated 08/09/17 there is no significant change. Recommend continued medical therapy.  Echocardiogram 11/21/2018: Study Conclusions  - Left  ventricle: The cavity size was normal. Wall thickness was increased in a pattern of mild LVH. Systolic function was normal. The estimated ejection fraction was in the range of 55% to 60%. Wall motion was normal; there were no regional wall motion abnormalities. Definity contrast was utilized. Indeterminate diastolic function. - Aortic valve: There was mild  regurgitation. - Mitral valve: Mildly calcified annulus. - Right atrium: Central venous pressure (est): 3 mm Hg. - Atrial septum: No defect or patent foramen ovale was identified.  - Tricuspid valve: There was mild regurgitation. - Pulmonary arteries: PA peak pressure: 25 mm Hg (S). - Pericardium, extracardiac: There was no pericardial effusion.  Carotid Dopplers 07/10/2018:  Final Interpretation: Right Carotid: Velocities in the right ICA are consistent with a 1-39% stenosis.  Left Carotid: Velocities in the left ICA are consistent with a 1-39% stenosis.  Vertebrals: Bilateral vertebral arteries demonstrate antegrade flow. Right vertebral artery demonstrates high resistant flow.  Labs/Other Tests and Data Reviewed:    EKG:  An ECG dated 04/06/2019 was personally reviewed today and demonstrated:  Sinus rhythm.  Recent Labs: 08/30/2019: ALT 29; BUN 21; Creatinine, Ser 1.13; Hemoglobin 16.0; Platelets 105; Potassium 4.0; Sodium 139   Recent Lipid Panel Lab Results  Component Value Date/Time   CHOL 114 07/09/2018 03:13 AM   TRIG 250 (H) 07/09/2018 03:13 AM   HDL 26 (L) 07/09/2018 03:13 AM   CHOLHDL 4.4 07/09/2018 03:13 AM   LDLCALC 38 07/09/2018 03:13 AM    Wt Readings from Last 3 Encounters:  10/17/19 235 lb (106.6 kg)  09/13/19 236 lb 4.8 oz (107.2 kg)  09/06/19 240 lb 4.8 oz (109 kg)     Objective:    Vital Signs:  BP 117/69   Pulse 69   Ht 5' 9"  (1.753 m)   Wt 235 lb (106.6 kg)   BMI 34.70 kg/m    Patient spoke in full sentences, not short of breath at rest. No audible wheezing or  coughing. Speech pattern normal.  ASSESSMENT & PLAN:    1.  Multivessel CAD status post CABG.  Cardiac catheterization in January revealed patent bypass grafts and we continue medical therapy.  Patient does not report any progressive angina symptoms.  No changes made to current regimen which is outlined above.  2.  Mixed hyperlipidemia.  He is on Pravachol and Zetia.  3.  OSA with plan to start BiPAP based on recent sleep testing.  Records reviewed.  4.  Essential hypertension, blood pressure is well controlled today.  No changes made to present regimen.  COVID-19 Education: The signs and symptoms of COVID-19 were discussed with the patient and how to seek care for testing (follow up with PCP or arrange E-visit).  The importance of social distancing was discussed today.  Time:   Today, I have spent 6 minutes with the patient with telehealth technology discussing the above problems.     Medication Adjustments/Labs and Tests Ordered: Current medicines are reviewed at length with the patient today.  Concerns regarding medicines are outlined above.   Tests Ordered: No orders of the defined types were placed in this encounter.   Medication Changes: No orders of the defined types were placed in this encounter.   Follow Up:  In Person 6 months in the [place] office.  Signed, Rozann Lesches, MD  10/17/2019 10:08 AM    [place]

## 2019-10-17 NOTE — Patient Instructions (Addendum)

## 2019-10-26 ENCOUNTER — Encounter (INDEPENDENT_AMBULATORY_CARE_PROVIDER_SITE_OTHER): Payer: Self-pay | Admitting: *Deleted

## 2019-10-26 ENCOUNTER — Telehealth (INDEPENDENT_AMBULATORY_CARE_PROVIDER_SITE_OTHER): Payer: Self-pay | Admitting: *Deleted

## 2019-10-26 NOTE — Telephone Encounter (Signed)
Received this notice from [place]: Called patient to go over financials and scheduling on 12/09, 12/11, 12/15, and sent an email today 12/17. No option to LVM on either numbers. I am voiding the order, just wanted to make you aware.   I called pt to discuss. No answer, no VM set up. Will send a mychart message.

## 2019-10-26 NOTE — Telephone Encounter (Signed)
Noted  

## 2019-10-26 NOTE — Telephone Encounter (Signed)
I've made several attempts to reach patient to schedule TCS/EGD but haven't been able to reach him - I have mailed a letter

## 2019-11-13 NOTE — Telephone Encounter (Signed)
I tried both # and was not able to LM.  Will try later,  If they call back let me know and I will try to speak to them right then.

## 2019-11-13 NOTE — Telephone Encounter (Signed)
VM not set up and other N/A to call back later.

## 2019-11-13 NOTE — Telephone Encounter (Signed)
Patient wife called stating that they were expecting to get scheduled for bipap delivery and have not been contacted. Please follow up.

## 2019-11-14 ENCOUNTER — Telehealth: Payer: Self-pay | Admitting: Neurology

## 2019-11-14 ENCOUNTER — Ambulatory Visit: Payer: Medicare Other | Attending: Adult Health | Admitting: Physical Therapy

## 2019-11-14 ENCOUNTER — Encounter: Payer: Self-pay | Admitting: *Deleted

## 2019-11-14 ENCOUNTER — Other Ambulatory Visit: Payer: Self-pay

## 2019-11-14 DIAGNOSIS — R42 Dizziness and giddiness: Secondary | ICD-10-CM

## 2019-11-14 NOTE — Telephone Encounter (Signed)
Referral sent by Hinton Dyer on 08/2019. Sent to her for review to assist pt.

## 2019-11-14 NOTE — Telephone Encounter (Signed)
Pt stopped by today requesting someone reaches out to him regarding an ENT referral that was talked about during a previous appointment. Please advise.

## 2019-11-14 NOTE — Telephone Encounter (Addendum)
I tried both # and VM not set up. I mailed letter to call.

## 2019-11-15 ENCOUNTER — Encounter: Payer: Self-pay | Admitting: Physical Therapy

## 2019-11-15 NOTE — Therapy (Signed)
[facility] [address] [place], [place], [address] Phone: [phone]   Fax:  [phone]  Physical Therapy Treatment  Patient Details  Name: [name] [name] MRN: [id number] Date of Birth: 1956-08-22 Referring Provider (PT): Frann Rider, NP   Encounter Date: 11/14/2019  PT End of Session - 11/15/19 1444    Visit Number  2    Number of Visits  17    Date for PT Re-Evaluation  01/11/20    Authorization Type  Medicare: progress note every 10th visit.    PT Start Time  1450    PT Stop Time  1521   session ended early as pt reports no vertigo today   PT Time Calculation (min)  31 min    Activity Tolerance  Patient tolerated treatment well    Behavior During Therapy  WFL for tasks assessed/performed       Past Medical History:  Diagnosis Date  . Anxiety   . Arthritis   . Asthma   . Cataract    Right eye  . Chronic lower back pain   . Cirrhosis of liver ([place])   . Coronary atherosclerosis of native coronary artery    a. LAD & OM stenting;  b. 2007 Cath: nonobs dzs, patent stents;  c. 07/2012 neg MV, EF 61%.  d. 11/20/14: [place] s/p  PCI w/ DES to pLAD and DES to OM1   . Essential hypertension   . GERD (gastroesophageal reflux disease)   . Hearing loss of left ear   . History of gout   . History of hiatal hernia   . Hypercholesteremia   . Lumbar herniated disc   . MI, old   . Migraine   . OSA (obstructive sleep apnea)   . 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 Thrombocytopenia ([place])   . TIA (transient ischemic attack) ~ 2010  . Type 2 diabetes mellitus ([place])     Past Surgical History:  Procedure Laterality Date  . ANTERIOR CERVICAL DECOMP/DISCECTOMY FUSION  1998   "C3-4"  . CARDIAC CATHETERIZATION  "several"  . CARDIAC CATHETERIZATION N/A 12/30/2015   Procedure: Left Heart Cath and Coronary Angiography;  Surgeon: [name], MD;   Location: [facility];  Service: Cardiovascular;  Laterality: N/A;  . CARPAL TUNNEL RELEASE Bilateral 2005  . CHOLECYSTECTOMY N/A 11/28/2018   Procedure: LAPAROSCOPIC CHOLECYSTECTOMY;  Surgeon: [name], MD;  Location: [facility];  Service: General;  Laterality: N/A;  . COLONOSCOPY    . CORONARY ANGIOPLASTY    . CORONARY ANGIOPLASTY WITH STENT PLACEMENT  2002; 2003; 11/20/2014   "I have 4 stents after today" (11/20/2014)  . CORONARY ARTERY BYPASS GRAFT N/A 01/09/2016   Procedure: CORONARY ARTERY BYPASS GRAFTING (CABG) X 3 UTILIZING RIGHT AND LEFT INTERNAL MAMMARY ARTERY AND ENDOSCOPICALLY HARVESTED SAPHENEOUS VEIN.;  Surgeon: Rexene Alberts, MD;  Location: [facility];  Service: Open Heart Surgery;  Laterality: N/A;  . ESOPHAGOGASTRODUODENOSCOPY    . KNEE SURGERY Left 02/2012   "scraped; open"  . LEFT HEART CATH AND CORS/GRAFTS ANGIOGRAPHY N/A 08/09/2017   Procedure: LEFT HEART CATH AND CORS/GRAFTS ANGIOGRAPHY;  Surgeon: [name], MD;  Location: [facility];  Service: Cardiovascular;  Laterality: N/A;  . LEFT HEART CATH AND CORS/GRAFTS ANGIOGRAPHY N/A 11/22/2018   Procedure: LEFT HEART CATH AND CORS/GRAFTS ANGIOGRAPHY;  Surgeon: [name], [name], MD;  Location:  [facility];  Service: Cardiovascular;  Laterality: N/A;  . LEFT HEART CATHETERIZATION WITH CORONARY ANGIOGRAM N/A 07/20/2012   Procedure: LEFT HEART CATHETERIZATION WITH CORONARY ANGIOGRAM;  Surgeon: [name], MD;  Location: [facility];  Service: Cardiovascular;  Laterality: N/A;  . LEFT HEART CATHETERIZATION WITH CORONARY ANGIOGRAM N/A 11/20/2014   Procedure: LEFT HEART CATHETERIZATION WITH CORONARY ANGIOGRAM;  Surgeon: [name], MD;  Location: [facility];  Service: Cardiovascular;  Laterality: N/A;  . LEFT HEART CATHETERIZATION WITH CORONARY ANGIOGRAM N/A 11/26/2014   Procedure: LEFT HEART CATHETERIZATION WITH CORONARY ANGIOGRAM;  Surgeon: [name], MD;  Location: [facility] C[facility];  Service:  Cardiovascular;  Laterality: N/A;  . NEUROPLASTY / TRANSPOSITION ULNAR NERVE AT ELBOW Right ~ 2012  . PERCUTANEOUS CORONARY ROTOBLATOR INTERVENTION (PCI-R)  11/20/2014   Procedure: PERCUTANEOUS CORONARY ROTOBLATOR INTERVENTION (PCI-R);  Surgeon: [name], MD;  Location: [facility];  Service: Cardiovascular;;  . SHOULDER ARTHROSCOPY Left ~ 2011  . TEE WITHOUT CARDIOVERSION N/A 01/09/2016   Procedure: TRANSESOPHAGEAL ECHOCARDIOGRAM (TEE);  Surgeon: Rexene Alberts, MD;  Location: [facility];  Service: Open Heart Surgery;  Laterality: N/A;    There were no vitals filed for this visit.  Subjective Assessment - 11/15/19 1441    Subjective  Pt reports he had some dizziness this morning that lasted about 30" - states he just sat down in his chair and rested until it passed.  Pt reports no dizziness at time of PT appt.  States he still has not heard anything about appt with ENT.    Pertinent History  TIA, OSA-waiting CPAP, B cataracts (extraction completed), DM, HLD, MI-CAD s/p CABG, CHF, anxiety, asthma, DDD of Cx spine, L ear hearing loss, R great toe gout, hx of migraines    Patient Stated Goals  Figure out why he's dizzy and stop it or find out a way to live with it.    Currently in Pain?  No/denies    Pain Onset  Today             Vestibular Assessment - 11/15/19 0001      Positional Testing   Dix-Hallpike  Dix-Hallpike Right;Dix-Hallpike Left    Sidelying Test  Sidelying Right;Sidelying Left      Dix-Hallpike Right   Dix-Hallpike Right Duration  none    Dix-Hallpike Right Symptoms  No nystagmus      Dix-Hallpike Left   Dix-Hallpike Left Duration  none    Dix-Hallpike Left Symptoms  No nystagmus      Sidelying Right   Sidelying Right Duration  none    Sidelying Right Symptoms  No nystagmus      Sidelying Left   Sidelying Left Duration  none    Sidelying Left Symptoms  No nystagmus      Positional Sensitivities   Sit to Supine  No dizziness    Head Turning x 5  No  dizziness    Head Nodding x 5  No dizziness                         PT Short Term Goals - 11/15/19 1451      PT SHORT TERM GOAL #1   Title  Pt will be IND in HEP to improve dizzines, balance, and strength. TARGET DATE FOR ALL STG: 11/10/19    Time  4    Period  Weeks    Status  New      PT  SHORT TERM GOAL #2   Title  Pt will improve gait speed to >/=2.62f/sec with LRAD to safely amb. in the community.    Time  4    Period  Weeks    Status  New      PT SHORT TERM GOAL #3   Title  Perform FGA and SOT, write goals as indicated.    Time  4    Period  Weeks    Status  New      PT SHORT TERM GOAL #4   Title  Perform positional testing and write goals as indicated.    Time  4    Period  Weeks    Status  New      PT SHORT TERM GOAL #5   Title  Pt will amb. 300' over even terrain, IND, while performing head turns to improve functional mobility with dizziness <3/10.    Time  4    Period  Weeks        PT Long Term Goals - 10/13/19 1323      PT LONG TERM GOAL #1   Title  Pt will amb. 600' over uneven, paved surfaces at MOD I level while performing head turns with </=1/10 dizziness to improve functional mobility. TARGET DATE FOR ALL LTGS: 12/07/18    Time  8    Period  Weeks    Status  New      PT LONG TERM GOAL #2   Title  Pt will report no falls in last four weeks to improve safety.    Time  8    Period  Weeks    Status  New            Plan - 11/15/19 1445    Clinical Impression Statement  Pt reported no vertigo with any positional testing during today's session, despite pt's report that vertigo occurred this morning prior to PT pm appt (lasted for approx. 30").  No nystagmus observed with any positional testing; pt denies balance problems unless he is having dizziness, and declined instruction in balance HEP as he states he wishes to determine cause of vertigo and get this problem resolved.  Pt states balance issue is secondary to the dizziness.   Unable to provoke any vertigo during today's treatment session, even with pt moving head and eyes in ways he states usually provokes the dizziness.    Personal Factors and Comorbidities  Comorbidity 3+;Past/Current Experience;Transportation    Comorbidities  TIA, OSA-waiting CPAP, B cataracts (extraction completed), DM, HLD, MI-CAD s/p CABG, CHF, anxiety, asthma, DDD of Cx spine, L ear hearing loss, R great toe gout, hx of migraines    Examination-Activity Limitations  Bed Mobility;Bend;Squat;Sit;Stairs;Carry;Stand;Transfers;Locomotion Level    Examination-Participation Restrictions  Yard Work;Driving;Other   unable to work at this time   Stability/Clinical Decision Making  Evolving/Moderate complexity    Rehab Potential  Good    PT Frequency  2x / week    PT Duration  8 weeks    PT Treatment/Interventions  ADLs/Self Care Home Management;Canalith Repostioning;Biofeedback;Balance training;Therapeutic exercise;Therapeutic activities;Manual techniques;Vestibular;Functional mobility training;Stair training;Orthotic Fit/Training;Gait training;DME Instruction;Patient/family education;Neuromuscular re-education    PT Next Visit Plan  Recheck vertigo - may D/C if pt reports no dizziness    PT Home Exercise Plan  x1 viewing    Consulted and Agree with Plan of Care  Patient       Patient will benefit from skilled therapeutic intervention in order to improve the following deficits and impairments:  Abnormal gait,  Impaired flexibility, Dizziness, Decreased range of motion, Decreased balance, Decreased mobility, Decreased knowledge of use of DME, Decreased strength, Impaired sensation  Visit Diagnosis: Dizziness and giddiness     Problem List Patient Active Problem List   Diagnosis Date Noted  . Hepatic cirrhosis ([place]) 09/13/2019  . GERD (gastroesophageal reflux disease) 09/13/2019  . Dysphagia 09/13/2019  . DDD (degenerative disc disease), cervical 04/13/2019  . RUQ abdominal pain 11/28/2018  .  Calculus of gallbladder with acute cholecystitis without obstruction   . Fatty liver   . Acute diastolic CHF (congestive heart failure) ([place]) 11/26/2018  . Right upper quadrant abdominal pain 11/26/2018  . Acute respiratory failure with hypoxia ([place]) 11/26/2018  . NASH (nonalcoholic steatohepatitis)   . Pancytopenia, acquired ([place])   . Acute kidney injury ([place]) 11/20/2018  . Aphasia 07/09/2018  . Cerebral thrombosis with cerebral infarction 07/09/2018  . Chest pain 08/10/2017  . 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 Coronary artery disease involving native coronary artery with unstable angina pectoris ([place])   . CAD -S/P PCI LAD OM/LAD '13 and 2016 12/31/2015  . Hyperlipidemia   . Chest pain with high risk of acute coronary syndrome 12/23/2015  . Precordial chest pain 12/03/2014  . TIA (transient ischemic attack)   . CAD- diffuse LAD disease at cath 12/30/15   . Thrombocytopenia ([place])   . Fever 12/04/2013  . Accelerating angina ([place]) 12/03/2013  . Type 2 diabetes mellitus ([place])   . Anxiety   . PVC's (premature ventricular contractions) 01/06/2013  . Obstructive sleep apnea-declines C-pap 07/04/2012  . Mixed hyperlipidemia 07/30/2009  . Essential hypertension, benign 07/30/2009    Alda Lea, PT 11/15/2019, 2:54 PM  [facility] [address] [place], [place], [address] Phone: [phone]   Fax:  [phone]  Name: [name] [name] MRN: [id number] Date of Birth: 1956-07-18

## 2019-11-16 ENCOUNTER — Ambulatory Visit: Payer: Medicare Other | Admitting: Physical Therapy

## 2019-11-16 ENCOUNTER — Telehealth: Payer: Self-pay | Admitting: Adult Health

## 2019-11-16 ENCOUNTER — Other Ambulatory Visit: Payer: Self-pay

## 2019-11-16 ENCOUNTER — Encounter: Payer: Self-pay | Admitting: Physical Therapy

## 2019-11-16 DIAGNOSIS — R42 Dizziness and giddiness: Secondary | ICD-10-CM | POA: Diagnosis not present

## 2019-11-16 DIAGNOSIS — R2689 Other abnormalities of gait and mobility: Secondary | ICD-10-CM

## 2019-11-16 DIAGNOSIS — H9312 Tinnitus, left ear: Secondary | ICD-10-CM

## 2019-11-16 DIAGNOSIS — H9012 Conductive hearing loss, unilateral, left ear, with unrestricted hearing on the contralateral side: Secondary | ICD-10-CM

## 2019-11-16 NOTE — Telephone Encounter (Signed)
Patient walked into the lobby after PT apt today stating he needs a referral to neuro ophthalmologist  Jolyn Nap [facility] [phone]  Or [facility] or [facility] - best call back is [phone]

## 2019-11-16 NOTE — Telephone Encounter (Signed)
I called pt and was not able to LM as VM not set up.

## 2019-11-16 NOTE — Telephone Encounter (Signed)
[facility] called patient to try to schedule him on 08/31/2019 09/01/2019 and 09/05/2019 .  I have called patient x4 to relay to him he needs to call them to schedule [phone] . I could not leave a message no voice mail on either of his phones.

## 2019-11-16 NOTE — Telephone Encounter (Signed)
IF patient stops by office or calls give him Hinton Dyer message below. They were unable to reach him, he was schedule three times. Hinton Dyer could not leave vm on his phone. Please give him number listed in phone note for ENT.

## 2019-11-17 NOTE — Therapy (Signed)
[facility] [address] [place], [place], [address] Phone: [phone]   Fax:  [phone]  Physical Therapy Treatment  Patient Details  Name: [name] [name] MRN: [id number] Date of Birth: [birth year] Referring Provider (PT): Frann Rider, NP   Encounter Date: 11/16/2019  PT End of Session - 11/17/19 9373    Visit Number  3    Number of Visits  17    Date for PT Re-Evaluation  01/11/20    Authorization Type  Medicare: progress note every 10th visit.    PT Start Time  1146    PT Stop Time  1230    PT Time Calculation (min)  44 min    Activity Tolerance  Patient tolerated treatment well    Behavior During Therapy  WFL for tasks assessed/performed       Past Medical History:  Diagnosis Date  . Anxiety   . Arthritis   . Asthma   . Cataract    Right eye  . Chronic lower back pain   . Cirrhosis of liver ([place])   . Coronary atherosclerosis of native coronary artery    a. LAD & OM stenting;  b. 2007 Cath: nonobs dzs, patent stents;  c. 07/2012 neg MV, EF 61%.  d. 11/20/14: [place] s/p  PCI w/ DES to pLAD and DES to OM1   . Essential hypertension   . GERD (gastroesophageal reflux disease)   . Hearing loss of left ear   . History of gout   . History of hiatal hernia   . Hypercholesteremia   . Lumbar herniated disc   . MI, old   . Migraine   . OSA (obstructive sleep apnea)   . 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 Thrombocytopenia ([place])   . TIA (transient ischemic attack) ~ 2010  . Type 2 diabetes mellitus ([place])     Past Surgical History:  Procedure Laterality Date  . ANTERIOR CERVICAL DECOMP/DISCECTOMY FUSION  1998   "C3-4"  . CARDIAC CATHETERIZATION  "several"  . CARDIAC CATHETERIZATION N/A 12/30/2015   Procedure: Left Heart Cath and Coronary Angiography;  Surgeon: [name], MD;  Location: [facility];  Service: Cardiovascular;   Laterality: N/A;  . CARPAL TUNNEL RELEASE Bilateral 2005  . CHOLECYSTECTOMY N/A 11/28/2018   Procedure: LAPAROSCOPIC CHOLECYSTECTOMY;  Surgeon: [name], MD;  Location: [facility];  Service: General;  Laterality: N/A;  . COLONOSCOPY    . CORONARY ANGIOPLASTY    . CORONARY ANGIOPLASTY WITH STENT PLACEMENT  2002; 2003; 11/20/2014   "I have 4 stents after today" (11/20/2014)  . CORONARY ARTERY BYPASS GRAFT N/A 01/09/2016   Procedure: CORONARY ARTERY BYPASS GRAFTING (CABG) X 3 UTILIZING RIGHT AND LEFT INTERNAL MAMMARY ARTERY AND ENDOSCOPICALLY HARVESTED SAPHENEOUS VEIN.;  Surgeon: Rexene Alberts, MD;  Location: [facility];  Service: Open Heart Surgery;  Laterality: N/A;  . ESOPHAGOGASTRODUODENOSCOPY    . KNEE SURGERY Left 02/2012   "scraped; open"  . LEFT HEART CATH AND CORS/GRAFTS ANGIOGRAPHY N/A 08/09/2017   Procedure: LEFT HEART CATH AND CORS/GRAFTS ANGIOGRAPHY;  Surgeon: [name], MD;  Location: [facility];  Service: Cardiovascular;  Laterality: N/A;  . LEFT HEART CATH AND CORS/GRAFTS ANGIOGRAPHY N/A 11/22/2018   Procedure: LEFT HEART CATH AND CORS/GRAFTS ANGIOGRAPHY;  Surgeon: [name], [name], MD;  Location: [facility];  Service: Cardiovascular;  Laterality: N/A;  .  LEFT HEART CATHETERIZATION WITH CORONARY ANGIOGRAM N/A 07/20/2012   Procedure: LEFT HEART CATHETERIZATION WITH CORONARY ANGIOGRAM;  Surgeon: [name], MD;  Location: [facility] LAB;  Service: Cardiovascular;  Laterality: N/A;  . LEFT HEART CATHETERIZATION WITH CORONARY ANGIOGRAM N/A 11/20/2014   Procedure: LEFT HEART CATHETERIZATION WITH CORONARY ANGIOGRAM;  Surgeon: [name], MD;  [facility];  Service: Cardiovascular;  Laterality: N/A;  . LEFT HEART CATHETERIZATION WITH CORONARY ANGIOGRAM N/A 11/26/2014   Procedure: LEFT HEART CATHETERIZATION WITH CORONARY ANGIOGRAM;  Surgeon: [name], MD;  Location: [facility] CATH LAB;  Service: Cardiovascular;  Laterality: N/A;  . NEUROPLASTY / TRANSPOSITION  ULNAR NERVE AT ELBOW Right ~ 2012  . PERCUTANEOUS CORONARY ROTOBLATOR INTERVENTION (PCI-R)  11/20/2014   Procedure: PERCUTANEOUS CORONARY ROTOBLATOR INTERVENTION (PCI-R);  Surgeon: [name], MD;  Location: [facility] CATH LAB;  Service: Cardiovascular;;  . SHOULDER ARTHROSCOPY Left ~ 2011  . TEE WITHOUT CARDIOVERSION N/A 01/09/2016   Procedure: TRANSESOPHAGEAL ECHOCARDIOGRAM (TEE);  Surgeon: Rexene Alberts, MD;  Location: [facility];  Service: Open Heart Surgery;  Laterality: N/A;    There were no vitals filed for this visit.  Subjective Assessment - 11/16/19 1155    Subjective  Pt reports he had some dizziness this morning when he first got up - lasted 5"; describes it as "everything... I see doubles and then that makes me dizzy"; pt states it clears up once he gets his eyes adjusted    Pertinent History  TIA, OSA-waiting CPAP, B cataracts (extraction completed), DM, HLD, MI-CAD s/p CABG, CHF, anxiety, asthma, DDD of Cx spine, L ear hearing loss, R great toe gout, hx of migraines    Patient Stated Goals  Figure out why he's dizzy and stop it or find out a way to live with it.    Currently in Pain?  No/denies    Pain Onset  Today             Vestibular Assessment - 11/17/19 0001      Positional Testing   Dix-Hallpike  Dix-Hallpike Right;Dix-Hallpike Left    Sidelying Test  Sidelying Right;Sidelying Left      Dix-Hallpike Right   Dix-Hallpike Right Duration  none    Dix-Hallpike Right Symptoms  No nystagmus      Dix-Hallpike Left   Dix-Hallpike Left Duration  none    Dix-Hallpike Left Symptoms  No nystagmus      Sidelying Right   Sidelying Right Duration  none    Sidelying Right Symptoms  No nystagmus      Sidelying Left   Sidelying Left Duration  none    Sidelying Left Symptoms  No nystagmus       Pt performed cervical extension for approx. 10 secs - nystagmus noted in bil. Eyes when pt returned to neutral position from Cervical extension - performed 2 reps with 10 sec hold -  2nd rep performed to assess occurrence of nystagmus which it was noted to Occur on 2nd rep as it had occurred on the first rep   Reviewed gaze stabilization exercise - x 1 viewing in seated position - pt states he is only doing this exercise occasionally - recommended To patient that he perform this exercise consistently as he has been instructed for his HEP                PT Education - 11/17/19 1831    Education Details  gave pt info on neuroopthalmology - Jolyn Nap, MD at [facility]:  Dr. Frederico Hamman in [place] and [place] Neuro -opthalmology - as pt's major c/o is double vision since CVA    Person(s) Educated  Patient    Methods  Explanation;Other (comment)   gave written info to patient   Comprehension  Verbalized understanding       PT Short Term Goals - 11/17/19 1842      PT SHORT TERM GOAL #1   Title  Pt will be IND in HEP to improve dizzines, balance, and strength. TARGET DATE FOR ALL STG: 11/10/19    Time  4    Period  Weeks    Status  New      PT SHORT TERM GOAL #2   Title  Pt will improve gait speed to >/=2.26f/sec with LRAD to safely amb. in the community.    Time  4    Period  Weeks    Status  New      PT SHORT TERM GOAL #3   Title  Perform FGA and SOT, write goals as indicated.    Time  4    Period  Weeks    Status  New      PT SHORT TERM GOAL #4   Title  Perform positional testing and write goals as indicated.    Time  4    Period  Weeks    Status  New      PT SHORT TERM GOAL #5   Title  Pt will amb. 300' over even terrain, IND, while performing head turns to improve functional mobility with dizziness <3/10.    Time  4    Period  Weeks        PT Long Term Goals - 11/17/19 1842      PT LONG TERM GOAL #1   Title  Pt will amb. 600' over uneven, paved surfaces at MOD I level while performing head turns with </=1/10 dizziness to improve functional mobility. TARGET DATE FOR ALL LTGS: 12/07/18    Time  8    Period  Weeks    Status  New       PT LONG TERM GOAL #2   Title  Pt will report no falls in last four weeks to improve safety.    Time  8    Period  Weeks    Status  New            Plan - 11/17/19 1834    Clinical Impression Statement  Pt had nystagmus in both eyes after performing cervical extension with neck held in extension for approx. 10 secs; pt performed this movement a 2nd time for confirmation of nystagmus and it did occur again in bil. eyes.  Pt reports double vision since his CVA - pt states he has double vision and then the dizziness starts.  Pt declines balance exercises at this time - wants to determine etiology of vertigo and address this problem as he states his balance deficits are secondary to the dizziness.  Pt would benefit from a neuro-opthalmology consult to further assess and treat diplopia.  Pt was given info with names of [facility] [place] [place] advised to discuss this with his neurologist as referral would be needed.    Personal Factors and Comorbidities  Comorbidity 3+;Past/Current Experience;Transportation    Comorbidities  TIA, OSA-waiting CPAP, B cataracts (extraction completed), DM, HLD, MI-CAD s/p CABG, CHF, anxiety, asthma, DDD of Cx spine, L ear hearing loss, R great toe gout, hx of migraines  Examination-Activity Limitations  Bed Mobility;Bend;Squat;Sit;Stairs;Carry;Stand;Transfers;Locomotion Level    Examination-Participation Restrictions  Yard Work;Driving;Other   unable to work at this time   Stability/Clinical Decision Making  Evolving/Moderate complexity    Rehab Potential  Good    PT Frequency  2x / week    PT Duration  8 weeks    PT Treatment/Interventions  ADLs/Self Care Home Management;Canalith Repostioning;Biofeedback;Balance training;Therapeutic exercise;Therapeutic activities;Manual techniques;Vestibular;Functional mobility training;Stair training;Orthotic Fit/Training;Gait training;DME Instruction;Patient/family education;Neuromuscular  re-education    PT Next Visit Plan  cancel appts week of 11-20-19 - pt's main c/o is diplopia - recommended pt receive neuro-opthalmology consult    PT Home Exercise Plan  x1 viewing;    Consulted and Agree with Plan of Care  Patient       Patient will benefit from skilled therapeutic intervention in order to improve the following deficits and impairments:  Abnormal gait, Impaired flexibility, Dizziness, Decreased range of motion, Decreased balance, Decreased mobility, Decreased knowledge of use of DME, Decreased strength, Impaired sensation  Visit Diagnosis: Dizziness and giddiness     Problem List Patient Active Problem List   Diagnosis Date Noted  . Hepatic cirrhosis ([place]) 09/13/2019  . GERD (gastroesophageal reflux disease) 09/13/2019  . Dysphagia 09/13/2019  . DDD (degenerative disc disease), cervical 04/13/2019  . RUQ abdominal pain 11/28/2018  . Calculus of gallbladder with acute cholecystitis without obstruction   . Fatty liver   . Acute diastolic CHF (congestive heart failure) ([place]) 11/26/2018  . Right upper quadrant abdominal pain 11/26/2018  . Acute respiratory failure with hypoxia ([place]) 11/26/2018  . NASH (nonalcoholic steatohepatitis)   . Pancytopenia, acquired ([place])   . Acute kidney injury ([place]) 11/20/2018  . Aphasia 07/09/2018  . Cerebral thrombosis with cerebral infarction 07/09/2018  . Chest pain 08/10/2017  . 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 Coronary artery disease involving native coronary artery with unstable angina pectoris ([place])   . CAD -S/P PCI LAD OM/LAD '13 and 2016 12/31/2015  . Hyperlipidemia   . Chest pain with high risk of acute coronary syndrome 12/23/2015  . Precordial chest pain 12/03/2014  . TIA (transient ischemic attack)   . CAD- diffuse LAD disease at cath 12/30/15   . Thrombocytopenia ([place])   . Fever 12/04/2013  . Accelerating angina ([place]) 12/03/2013  . Type 2 diabetes mellitus ([place])   . Anxiety   . PVC's (premature ventricular contractions)  01/06/2013  . Obstructive sleep apnea-declines C-pap 07/04/2012  . Mixed hyperlipidemia 07/30/2009  . Essential hypertension, benign 07/30/2009    Alda Lea, PT 11/17/2019, 6:44 PM  [facility] [address] [place], [place], [address] Phone: [phone]   Fax:  [phone]  Name: [name] [name] MRN: [id number] Date of Birth: 10/23/1956

## 2019-11-20 NOTE — Telephone Encounter (Signed)
Order placed

## 2019-11-20 NOTE — Addendum Note (Signed)
Addended by: Brandon Melnick on: 11/20/2019 01:38 PM   Modules accepted: Orders

## 2019-11-20 NOTE — Telephone Encounter (Signed)
Received notice from [facility] that they will reach out to pt.

## 2019-11-20 NOTE — Telephone Encounter (Signed)
Recent therapy note on 11/15/2018 did recommend neuro-ophthalmology due to evidence of nystagmus and complaints of double vision accompanied by dizziness/imbalance.  Please place referral to neuro-ophthalmology as requested by therapy for further evaluation

## 2019-11-20 NOTE — Telephone Encounter (Signed)
I called pt (I asked him what time is good time to reach him as we and others have tried to call him and VM not set up).  He stated 9-10 am.  Re instated the the cpap order for him.  He also needs to call [facility] to set up appt.  He states that PT for vestibular rehab, noted for him to get referral for Neuro opthamologist, are you ok with this.  I did not see this in her PT note.

## 2019-11-20 NOTE — Telephone Encounter (Signed)
Order placed for neuro opthamology.

## 2019-11-21 ENCOUNTER — Encounter: Payer: Medicare Other | Admitting: Physical Therapy

## 2019-11-22 NOTE — Telephone Encounter (Signed)
I called pt and gave him the date, time of Dr. Hassell Done (neuro opthamologist).  I gave him the # to [facility] to call and set up appt.  I gave him the number to [facility] to call relating to cpap.  He stated that he will call.

## 2019-11-23 ENCOUNTER — Encounter: Payer: Medicare Other | Admitting: Physical Therapy

## 2019-11-28 ENCOUNTER — Ambulatory Visit: Payer: Medicare Other | Admitting: Physical Therapy

## 2019-11-30 ENCOUNTER — Ambulatory Visit: Payer: Medicare Other | Admitting: Physical Therapy

## 2019-12-05 ENCOUNTER — Encounter: Payer: Self-pay | Admitting: Physical Therapy

## 2019-12-05 ENCOUNTER — Other Ambulatory Visit: Payer: Self-pay

## 2019-12-05 ENCOUNTER — Ambulatory Visit: Payer: Medicare Other | Admitting: Physical Therapy

## 2019-12-05 DIAGNOSIS — R42 Dizziness and giddiness: Secondary | ICD-10-CM | POA: Diagnosis not present

## 2019-12-06 NOTE — Therapy (Signed)
[facility] [address] [place], [place], [address] Phone: [phone]   Fax:  [phone]  Physical Therapy Treatment & Discharge Summary  Patient Details  Name: [name] [name] MRN: [id number] Date of Birth: Oct 08, 1956 Referring Provider (PT): Frann Rider, NP   Encounter Date: 12/05/2019  PT End of Session - 12/06/19 2018    Visit Number  4    Number of Visits  17    Date for PT Re-Evaluation  01/11/20    Authorization Type  Medicare: progress note every 10th visit.    PT Start Time  1100    PT Stop Time  1132   pt D/C'd - session ended early   PT Time Calculation (min)  32 min    Activity Tolerance  Patient tolerated treatment well    Behavior During Therapy  WFL for tasks assessed/performed       Past Medical History:  Diagnosis Date  . Anxiety   . Arthritis   . Asthma   . Cataract    Right eye  . Chronic lower back pain   . Cirrhosis of liver ([place])   . Coronary atherosclerosis of native coronary artery    a. LAD & OM stenting;  b. 2007 Cath: nonobs dzs, patent stents;  c. 07/2012 neg MV, EF 61%.  d. 11/20/14: [place] s/p  PCI w/ DES to pLAD and DES to OM1   . Essential hypertension   . GERD (gastroesophageal reflux disease)   . Hearing loss of left ear   . History of gout   . History of hiatal hernia   . Hypercholesteremia   . Lumbar herniated disc   . MI, old   . Migraine   . OSA (obstructive sleep apnea)   . 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 Thrombocytopenia ([place])   . TIA (transient ischemic attack) ~ 2010  . Type 2 diabetes mellitus ([place])     Past Surgical History:  Procedure Laterality Date  . ANTERIOR CERVICAL DECOMP/DISCECTOMY FUSION  1998   "C3-4"  . CARDIAC CATHETERIZATION  "several"  . CARDIAC CATHETERIZATION N/A 12/30/2015   Procedure: Left Heart Cath and Coronary Angiography;  Surgeon: [name], MD;   Location: [facility];  Service: Cardiovascular;  Laterality: N/A;  . CARPAL TUNNEL RELEASE Bilateral 2005  . CHOLECYSTECTOMY N/A 11/28/2018   Procedure: LAPAROSCOPIC CHOLECYSTECTOMY;  Surgeon: [name], MD;  [facility]: [facility];  Service: General;  Laterality: N/A;  . COLONOSCOPY    . CORONARY ANGIOPLASTY    . CORONARY ANGIOPLASTY WITH STENT PLACEMENT  2002; 2003; 11/20/2014   "I have 4 stents after today" (11/20/2014)  . CORONARY ARTERY BYPASS GRAFT N/A 01/09/2016   Procedure: CORONARY ARTERY BYPASS GRAFTING (CABG) X 3 UTILIZING RIGHT AND LEFT INTERNAL MAMMARY ARTERY AND ENDOSCOPICALLY HARVESTED SAPHENEOUS VEIN.;  Surgeon: Rexene Alberts, MD;  Location: [facility];  Service: Open Heart Surgery;  Laterality: N/A;  . ESOPHAGOGASTRODUODENOSCOPY    . KNEE SURGERY Left 02/2012   "scraped; open"  . LEFT HEART CATH AND CORS/GRAFTS ANGIOGRAPHY N/A 08/09/2017   Procedure: LEFT HEART CATH AND CORS/GRAFTS ANGIOGRAPHY;  Surgeon: [name], MD;  Location: [facility];  Service: Cardiovascular;  Laterality: N/A;  . LEFT HEART CATH AND CORS/GRAFTS ANGIOGRAPHY N/A 11/22/2018   Procedure: LEFT HEART CATH AND CORS/GRAFTS ANGIOGRAPHY;  Surgeon: [name], [name], MD;  Location:  [facility];  Service: Cardiovascular;  Laterality: N/A;  . LEFT HEART CATHETERIZATION WITH CORONARY ANGIOGRAM N/A 07/20/2012   Procedure: LEFT HEART CATHETERIZATION WITH CORONARY ANGIOGRAM;  Surgeon: [name], MD;  Location: [facility] CATH LAB;  Service: Cardiovascular;  Laterality: N/A;  . LEFT HEART CATHETERIZATION WITH CORONARY ANGIOGRAM N/A 11/20/2014   Procedure: LEFT HEART CATHETERIZATION WITH CORONARY ANGIOGRAM;  Surgeon: [name], MD;  Location: [facility] C[facility] LAB;  Service: Cardiovascular;  Laterality: N/A;  . LEFT HEART CATHETERIZATION WITH CORONARY ANGIOGRAM N/A 11/26/2014   Procedure: LEFT HEART CATHETERIZATION WITH CORONARY ANGIOGRAM;  Surgeon: [name], MD;  Location: [facility] CATH LAB;  Service:  Cardiovascular;  Laterality: N/A;  . NEUROPLASTY / TRANSPOSITION ULNAR NERVE AT ELBOW Right ~ 2012  . PERCUTANEOUS CORONARY ROTOBLATOR INTERVENTION (PCI-R)  11/20/2014   Procedure: PERCUTANEOUS CORONARY ROTOBLATOR INTERVENTION (PCI-R);  Surgeon: [name], MD;  Location: [facility] CATH LAB;  Service: Cardiovascular;;  . SHOULDER ARTHROSCOPY Left ~ 2011  . TEE WITHOUT CARDIOVERSION N/A 01/09/2016   Procedure: TRANSESOPHAGEAL ECHOCARDIOGRAM (TEE);  Surgeon: Rexene Alberts, MD;  Location: [facility];  Service: Open Heart Surgery;  Laterality: N/A;    There were no vitals filed for this visit.     Pt reports no dizziness at today's session - he does continue to report that eyes move (nystagmus) occasionally and he has  Visual disturbance/diplopia when this occurs  Etiology of vertigo is unknown at this time - pt has been referred to neuro-ophthalmologist (Dr. Jolyn Nap [facility]) [facility] But states appt. Is not scheduled until April 2021  NeuroRe-ed: Dynamic visual acuity 5 line difference with static visual acuity line 10 - DVA line 5; this has improved by 2 lines since initial eval  Rt and Lt sidelying tests (-) with no nystagmus and no c/o true vertigo in test position  Self Care:  Reviewed STG's and HEP of x1 viewing exercise; pt agreed to D/C due to no significant change in vertigo with pt scheduled to have appt with Neuro-ophthalmologist appt in April as dizziness appears to be related to visual deficits (diplopia)                      PT Education - 12/06/19 2029    Education Details  reviewed x1 viewing exercise for HEP    Person(s) Educated  Patient    Methods  Explanation    Comprehension  Verbalized understanding       PT Short Term Goals - 12/05/19 1111      PT SHORT TERM GOAL #1   Title  Pt will be IND in HEP to improve dizzines, balance, and strength. TARGET DATE FOR ALL STG: 11/10/19    Status  Achieved      PT SHORT TERM GOAL #2   Title   Pt will improve gait speed to >/=2.56f/sec with LRAD to safely amb. in the community.    Baseline  pt reports problems with gait if he is not having dizziness    Status  Deferred      PT SHORT TERM GOAL #3   Title  Perform FGA and SOT, write goals as indicated.    Status  Deferred      PT SHORT TERM GOAL #4   Title  Perform positional testing and write goals as indicated.    Status  Achieved      PT SHORT TERM GOAL #5   Title  Pt will amb. 300' over even  terrain, IND, while performing head turns to improve functional mobility with dizziness <3/10.    Status  Deferred        PT Long Term Goals - 12/05/19 1113      PT LONG TERM GOAL #1   Title  Pt will amb. 600' over uneven, paved surfaces at MOD I level while performing head turns with </=1/10 dizziness to improve functional mobility. TARGET DATE FOR ALL LTGS: 12/07/18    Status  Deferred      PT LONG TERM GOAL #2   Title  Pt will report no falls in last four weeks to improve safety.    Status  Achieved              Patient will benefit from skilled therapeutic intervention in order to improve the following deficits and impairments:     Visit Diagnosis: Dizziness and giddiness     Problem List Patient Active Problem List   Diagnosis Date Noted  . Hepatic cirrhosis ([place]) 09/13/2019  . GERD (gastroesophageal reflux disease) 09/13/2019  . Dysphagia 09/13/2019  . DDD (degenerative disc disease), cervical 04/13/2019  . RUQ abdominal pain 11/28/2018  . Calculus of gallbladder with acute cholecystitis without obstruction   . Fatty liver   . Acute diastolic CHF (congestive heart failure) ([place]) 11/26/2018  . Right upper quadrant abdominal pain 11/26/2018  . Acute respiratory failure with hypoxia ([place]) 11/26/2018  . NASH (nonalcoholic steatohepatitis)   . Pancytopenia, acquired ([place])   . Acute kidney injury ([place]) 11/20/2018  . Aphasia 07/09/2018  . Cerebral thrombosis with cerebral infarction 07/09/2018  . Chest  pain 08/10/2017  . 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 Coronary artery disease involving native coronary artery with unstable angina pectoris ([place])   . CAD -S/P PCI LAD OM/LAD '13 and 2016 12/31/2015  . Hyperlipidemia   . Chest pain with high risk of acute coronary syndrome 12/23/2015  . Precordial chest pain 12/03/2014  . TIA (transient ischemic attack)   . CAD- diffuse LAD disease at cath 12/30/15   . Thrombocytopenia ([place])   . Fever 12/04/2013  . Accelerating angina ([place]) 12/03/2013  . Type 2 diabetes mellitus ([place])   . Anxiety   . PVC's (premature ventricular contractions) 01/06/2013  . Obstructive sleep apnea-declines C-pap 07/04/2012  . Mixed hyperlipidemia 07/30/2009  . Essential hypertension, benign 07/30/2009     PHYSICAL THERAPY DISCHARGE SUMMARY  Visits from Start of Care: 4  Current functional level related to goals / functional outcomes: See above for progress towards goals  Remaining deficits: Cont. C/o dizziness of unknown etiology; pt has visual dysfunction with spontaneous nystagmus at times and c/o diplopia Cont. Decr. VOR with DVA 5 line difference (7 line difference at eval; normal = </= 2 line difference)  Education / Equipment: Pt has been instructed in x1 viewing exercise for HEP;  Pt has neuro-ophthalmologist appt in April 2021  Plan: Patient agrees to discharge.  Patient goals were partially met. Patient is being discharged due to the patient's request.  ?????    Pt and therapist agreed to D/C at this time due to lack of progress as etiology of vertigo remains unknown - pt has appt with neuro-ophthalmologist in April; we will resume PT if recommended and ordered at that time.  Pt agrees with this plan.          Alda Lea, PT 12/06/2019, 8:36 PM  [facility] [address] [place], [place], [address] Phone: [phone]  Fax:  [phone]  Name: MANCEL LARDIZABAL MRN:  [id number] Date of Birth: 27-Oct-1956

## 2019-12-07 ENCOUNTER — Ambulatory Visit: Payer: Medicare Other | Admitting: Physical Therapy

## 2019-12-21 DIAGNOSIS — J310 Chronic rhinitis: Secondary | ICD-10-CM | POA: Insufficient documentation

## 2019-12-21 DIAGNOSIS — J342 Deviated nasal septum: Secondary | ICD-10-CM | POA: Insufficient documentation

## 2020-01-02 NOTE — Telephone Encounter (Signed)
Received an email from [facility] in regards to attempting to get the patient set up. "This patient no showed his appointment twice, once on 12/04/2019 and again on 12/15/2019. 02/16 called to reschedule, no answer vm box not set up. 02/16 email sent to pt. 02/19 called pt to reschedule, no answer vm box not set up. I am voiding this order, just wanted to make you all aware."

## 2020-01-17 ENCOUNTER — Ambulatory Visit (INDEPENDENT_AMBULATORY_CARE_PROVIDER_SITE_OTHER): Payer: Medicare Other | Admitting: Adult Health

## 2020-01-17 ENCOUNTER — Encounter: Payer: Self-pay | Admitting: Adult Health

## 2020-01-17 ENCOUNTER — Other Ambulatory Visit: Payer: Self-pay

## 2020-01-17 VITALS — BP 124/74 | HR 70 | Temp 97.6°F | Ht 69.0 in | Wt 236.6 lb

## 2020-01-17 DIAGNOSIS — R42 Dizziness and giddiness: Secondary | ICD-10-CM | POA: Diagnosis not present

## 2020-01-17 DIAGNOSIS — G4733 Obstructive sleep apnea (adult) (pediatric): Secondary | ICD-10-CM

## 2020-01-17 DIAGNOSIS — R2990 Unspecified symptoms and signs involving the nervous system: Secondary | ICD-10-CM | POA: Diagnosis not present

## 2020-01-17 DIAGNOSIS — H8190 Unspecified disorder of vestibular function, unspecified ear: Secondary | ICD-10-CM

## 2020-01-17 DIAGNOSIS — I10 Essential (primary) hypertension: Secondary | ICD-10-CM | POA: Diagnosis not present

## 2020-01-17 DIAGNOSIS — E785 Hyperlipidemia, unspecified: Secondary | ICD-10-CM

## 2020-01-17 DIAGNOSIS — E1100 Type 2 diabetes mellitus with hyperosmolarity without nonketotic hyperglycemic-hyperosmolar coma (NKHHC): Secondary | ICD-10-CM | POA: Diagnosis not present

## 2020-01-17 NOTE — Progress Notes (Signed)
[facility] [address] [place]. [place] [address] [phone]       OFFICE FOLLOW UP NOTE  [name] Date of Birth:  05/07/1956 Medical Record Number:  [id number]   Reason for Referral:  stroke follow up  CHIEF COMPLAINT:  Chief Complaint  Patient presents with  . Follow-up    Vertigo, dizziness, tinnitus, hearing loss    HPI:  [name] [name] who is being seen today for follow-up regarding prior vertigo, decreased hearing and tinnitus.  He was evaluated for ENT evaluation to rule out possible Mnire's disease versus other etiology.  Per review of ENT evaluation note, recommended avoiding all noises and use of hearing aid on left side but no other recommendations.  He will be seeing neuro-ophthalmology in April for further evaluation.  Completed vestibular rehab without benefit.  He does endorse continued sensation of at times vertigo with room spinning and other times lightheadedness sensation.  The symptoms typically present after symptoms of double vision bilaterally and difficulty focusing.  Wife questions if the symptoms could be related to prior stroke on multiple imaging did not show any abnormalities as when initially presented on 06/2018 diagnosed with strokelike symptoms status post TPA.  Continues on aspirin 81 mg daily and pravastatin for stroke prevention.  Glucose levels have been stable.  Blood pressure today satisfactory 124/74.  Apparently, CPAP company has had difficulty contacting patient but wife plans on reaching out to them today in order to receive CPAP for treatment of sleep apnea.  No further concerns at this time.     History copied for reference purposes only Stroke admission 07/08/2018: [name] [name] an 64 y.o.malewith PMH TIA, DM 2, HLD, CAD s/p CABG, OSA, migraines, CHF, anxiety and arthritis who presented to [facility] with sudden onset blurry vision, diplopia, and confusion.  Telemetry  neurology was consulted.  CT head was negative for acute abnormality.  CTA head and neck show calcified arthrosclerosis disease at the carotid bifurcation without hemodynamically significant stenosis without evidence of dissection or aneurysm therefore IV TPA was administered and was transferred to [facility] for further work-up and management.  MRI brain reviewed and was negative for acute infarct or hemorrhage.  MRA negative.  Carotid Doppler showed bilateral ICA stenosis of 1 to 39% with VAs antegrade.  2D echo showed an EF of 60 to 65% without cardiac source of embolus identified.  LDL 38 and recommended resuming Pravachol and Zetia at discharge.  A1c 7.3 and recommended continued follow-up with PCP for DM management.  HTN stable during admission recommended long-term BP goal normotensive range.  Patient was previously on aspirin and Plavix and it was recommended to continue this for 3 weeks and then aspirin 81 mg alone.  Patient was discharged home with recommendations of outpatient PT/OT in stable condition.  Initial consult 08/30/2018: Patient is being seen today for hospital follow up. Continues to have some blurry vision and was told he has "a weak eye" by a provider in the hospital. He has not follow up with his ophthalmology  since discharge. 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 Has returned to driving without complications. Memory at times is slightly slower but has been improving. Completed 3 weeks of DAPT and continues aspriin 55m only without side effects of bleeding or bruising. Continues to take pravastatin and zetia without myalgias. Blood pressure today 125/71 which is normal for patient as he  monitors at home. Patient currently has therapy dog which has been helping patient with his stress and anxiety. History of OSA but has not been using CPAP as he states he is unable to find the correct company to replace parts.  He  has not used his CPAP in 2 to 3 months.  He was advised to follow-up with provider who is managing OSA but he states they are located in [place], [place] where he used to reside.  He currently is living in [place] but will be moving to [place] area in November therefore he is requesting establishment with new OSA provider.  No further concerns at this time. Denies new or worsening TIA/stroke symptoms.  Side notes, patient complains of ankle swelling towards end of the day. Denies use of compression stockings. He will elevate with positive effect but concerned due to recent worsening over the past 2-3 weeks. He does state that he has been increasing his exercise in this time with obtaining PT/OT.  He denies shortness of breath, increased fatigue or chest pain.  06/27/2019 update: [name] [name] who is being seen today for stroke follow-up and new onset concerns of dizziness.  He has 1 month history of slowly worsening lightheaded sensation and gait ataxia.  He denies association with position changes or rapid head movements.  Sensation initially occurring intermittently but currently multiple times daily.  Sensation occurs sitting quietly or with ambulation.  Typically lasts for less than 1 minute and then subsides.  He does endorse having 3 falls over the past month due to lightheaded sensation with loss of balance.  He denies change in medications, diet, activity level or stress level.  He does endorse stressful living situation with his 82 year old son but this has been ongoing and denies increased stress.  Wife endorses generalized shaking/tremors when walking and talking.  He also has association of double vision which is typically present with lightheadedness feeling.  He has had double vision in the past for which he initially presented to ED for stroke work-up in 06/2018.  Vision largely improved since that time but has now worsened with association of lightheadedness.  He has had dizziness  sensation in the past related to blood pressure but feels as though the symptoms are different from what he has experienced in the past.  He does monitor blood pressure at home routinely and typically range 130s/70s.  Blood pressure today 127/77.  Continues on aspirin 81 mg and pravastatin for secondary stroke prevention without side effects.  He does have underlying history of OSA with referral placed at prior visit to establish care with a [facility] sleep provider but has not right at this time.  No further concerns at this time  Update 08/30/2019: [name] [name] is being seen today for 17-monthfollow-up regarding new onset of dizziness.  MRI wo contrast obtained which did not show any acute abnormalities.  Attempted to receive contrast with MRI but unfortunately had contrast reaction therefore postcontrast imaging could not be obtained.  He did have consult with our sleep specialist Dr. ARexene Albertson 08/03/2019 for prior history of sleep apnea and underwent sleep study recently on 08/27/2019 for potential need of restarting CPAP therapy.  Vertigo continues without improvement but denies worsening. He will be completing physical therapy at home after 1 additional visit.  He states they have been mainly focusing on strengthening lower extremities and have not gotten any type of vestibular maneuvers.  Episodes of vertigo can last anywhere from 5 minutes up  until 30 minutes and can occur anytime.  He states he will typically feel a pulling towards his left side.  He also endorses hearing loss and tinnitus, unsure if worsened recently.  Previously experience blurred vision with vertigo and this has mildly improved.  He denies headache with episodes.      ROS:   14 system review of systems performed and negative with exception of dizziness, tinnitus and hearing loss  PMH:  Past Medical History:  Diagnosis Date  . Anxiety   . Arthritis   . Asthma   . Cataract    Right eye  . Chronic lower back pain   . Cirrhosis of  liver ([place])   . Coronary atherosclerosis of native coronary artery    a. LAD & OM stenting;  b. 2007 Cath: nonobs dzs, patent stents;  c. 07/2012 neg MV, EF 61%.  d. 11/20/14: [place] s/p  PCI w/ DES to pLAD and DES to OM1   . Essential hypertension   . GERD (gastroesophageal reflux disease)   . Hearing loss of left ear   . History of gout   . History of hiatal hernia   . Hypercholesteremia   . Lumbar herniated disc   . MI, old   . Migraine   . OSA (obstructive sleep apnea)   . 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 Thrombocytopenia ([place])   . TIA (transient ischemic attack) ~ 2010  . Type 2 diabetes mellitus (HCC)     PSH:  Past Surgical History:  Procedure Laterality Date  . ANTERIOR CERVICAL DECOMP/DISCECTOMY FUSION  1998   "C3-4"  . CARDIAC CATHETERIZATION  "several"  . CARDIAC CATHETERIZATION N/A 12/30/2015   Procedure: Left Heart Cath and Coronary Angiography;  Surgeon: [name], MD;  Location: [place] [facility];  Service: Cardiovascular;  Laterality: N/A;  . CARPAL TUNNEL RELEASE Bilateral 2005  . CHOLECYSTECTOMY N/A 11/28/2018   Procedure: LAPAROSCOPIC CHOLECYSTECTOMY;  Surgeon: [name], MD;  Location: [facility];  Service: General;  Laterality: N/A;  . COLONOSCOPY    . CORONARY ANGIOPLASTY    . CORONARY ANGIOPLASTY WITH STENT PLACEMENT  2002; 2003; 11/20/2014   "I have 4 stents after today" (11/20/2014)  . CORONARY ARTERY BYPASS GRAFT N/A 01/09/2016   Procedure: CORONARY ARTERY BYPASS GRAFTING (CABG) X 3 UTILIZING RIGHT AND LEFT INTERNAL MAMMARY ARTERY AND ENDOSCOPICALLY HARVESTED SAPHENEOUS VEIN.;  Surgeon: Rexene Alberts, MD;  Location: [place];  Service: Open Heart Surgery;  Laterality: N/A;  . ESOPHAGOGASTRODUODENOSCOPY    . KNEE SURGERY Left 02/2012   "scraped; open"  . LEFT HEART CATH AND CORS/GRAFTS ANGIOGRAPHY N/A 08/09/2017   Procedure: LEFT HEART CATH AND CORS/GRAFTS ANGIOGRAPHY;  Surgeon:  [name], MD;  Location: [place] [facility];  Service: Cardiovascular;  Laterality: N/A;  . LEFT HEART CATH AND CORS/GRAFTS ANGIOGRAPHY N/A 11/22/2018   Procedure: LEFT HEART CATH AND CORS/GRAFTS ANGIOGRAPHY;  Surgeon: [name], [name], MD;  Location: [name] [facility];  Service: Cardiovascular;  Laterality: N/A;  . LEFT HEART CATHETERIZATION WITH CORONARY ANGIOGRAM N/A 07/20/2012   Procedure: LEFT HEART CATHETERIZATION WITH CORONARY ANGIOGRAM;  Surgeon: [name], MD;  Location: [place] [facility];  Service: Cardiovascular;  Laterality: N/A;  . LEFT HEART CATHETERIZATION WITH CORONARY ANGIOGRAM N/A 11/20/2014   Procedure: LEFT HEART CATHETERIZATION WITH CORONARY ANGIOGRAM;  Surgeon: [name], MD;  Location: [facility] C[facility] LAB;  Service: Cardiovascular;  Laterality: N/A;  . LEFT HEART CATHETERIZATION WITH CORONARY ANGIOGRAM N/A 11/26/2014   Procedure: LEFT HEART CATHETERIZATION WITH CORONARY ANGIOGRAM;  Surgeon: [name], MD;  Location: [facility] CATH LAB;  Service: Cardiovascular;  Laterality: N/A;  . NEUROPLASTY / TRANSPOSITION ULNAR NERVE AT ELBOW Right ~ 2012  . PERCUTANEOUS CORONARY ROTOBLATOR INTERVENTION (PCI-R)  11/20/2014   Procedure: PERCUTANEOUS CORONARY ROTOBLATOR INTERVENTION (PCI-R);  Surgeon: [name], MD;  Location[facility] C[facility] LAB;  Service: Cardiovascular;;  . SHOULDER ARTHROSCOPY Left ~ 2011  . TEE WITHOUT CARDIOVERSION N/A 01/09/2016   Procedure: TRANSESOPHAGEAL ECHOCARDIOGRAM (TEE);  Surgeon: Rexene Alberts, MD;  Location: [place];  Service: Open Heart Surgery;  Laterality: N/A;    Social History:  Social History   Socioeconomic History  . Marital status: Married    Spouse name: [name]  . Number of children: 3  . Years of education: 45 th   . Highest education level: Not on file  Occupational History  . Occupation: Unemployed  Tobacco Use  . Smoking status: Never Smoker  . Smokeless tobacco: Never Used  Substance and Sexual Activity  . Alcohol use: No  . Drug  use: No  . Sexual activity: Not on file  Other Topics Concern  . Not on file  Social History Narrative   Patient lives at home with wife [name].   Patient works at [employer], Engineer, petroleum.   Patient has a 12 th grade education.    Patient has 3 children.       Social Determinants of Health   Financial Resource Strain:   . Difficulty of Paying Living Expenses: Not on file  Food Insecurity:   . Worried About Charity fundraiser in the Last Year: Not on file  . Ran Out of Food in the Last Year: Not on file  Transportation Needs:   . Lack of Transportation (Medical): Not on file  . Lack of Transportation (Non-Medical): Not on file  Physical Activity:   . Days of Exercise per Week: Not on file  . Minutes of Exercise per Session: Not on file  Stress:   . Feeling of Stress : Not on file  Social Connections:   . Frequency of Communication with Friends and Family: Not on file  . Frequency of Social Gatherings with Friends and Family: Not on file  . Attends Religious Services: Not on file  . Active Member of Clubs or Organizations: Not on file  . Attends Archivist Meetings: Not on file  . Marital Status: Not on file  Intimate Partner Violence:   . Fear of Current or Ex-Partner: Not on file  . Emotionally Abused: Not on file  . Physically Abused: Not on file  . Sexually Abused: Not on file    Family History:  Family History  Problem Relation Age of Onset  . Stroke Mother   . Coronary artery disease Father 31  . Asthma Sister   . Multiple sclerosis Brother   . Heart disease Paternal Uncle   . Stroke Maternal Grandmother   . Heart attack Paternal Grandmother   . Cancer Paternal Grandfather   . Asthma Sister   . Seizures Son   . Migraines Son   . Autism Son   . Migraines Son     Medications:   Current Outpatient Medications on File Prior to Visit  Medication Sig Dispense Refill  . albuterol (PROVENTIL HFA;VENTOLIN HFA) 108 (90 BASE) MCG/ACT  inhaler Inhale 2 puffs into  the lungs every 6 (six) hours as needed for wheezing or shortness of breath.    [name] amLODipine (NORVASC) 5 MG tablet Take 5 mg by mouth at bedtime.  4  . aspirin EC 81 MG tablet Take 81 mg by mouth at bedtime.     . citalopram (CELEXA) 20 MG tablet Take 20 mg by mouth at bedtime.     . Cyanocobalamin (VITAMIN B-12 PO) Take 1,200 mg by mouth daily.    . diazepam (VALIUM) 5 MG tablet Take 5 mg by mouth every 6 (six) hours as needed for anxiety.    . ergocalciferol (VITAMIN D2) 1.25 MG (50000 UT) capsule Take 1 capsule (50,000 Units total) by mouth once a week. 20 capsule 1  . ezetimibe (ZETIA) 10 MG tablet Take 10 mg by mouth daily.    . ferrous sulfate 325 (65 FE) MG EC tablet Take 325 mg by mouth 3 (three) times daily with meals.    . fluticasone (FLONASE) 50 MCG/ACT nasal spray Place 1 spray into the nose 2 (two) times daily.     . Fluticasone-Salmeterol (ADVAIR) 250-50 MCG/DOSE AEPB Inhale 1 puff into the lungs every 12 (twelve) hours.    . furosemide (LASIX) 20 MG tablet Take 20 mg by mouth daily.    [name] gabapentin (NEURONTIN) 300 MG capsule Take 300 mg by mouth 3 (three) times daily.  2  . insulin aspart (NOVOLOG FLEXPEN) 100 UNIT/ML FlexPen Inject 2-30 Units into the skin 3 (three) times daily as needed for high blood sugar (CBG >150). Per sliding scale written down at home     . isosorbide mononitrate (IMDUR) 30 MG 24 hr tablet Take 30 mg by mouth at bedtime.  4  . Melatonin 5 MG TABS Take 10 mg by mouth at bedtime.     . metFORMIN (GLUCOPHAGE) 1000 MG tablet Take 1 tablet (1,000 mg total) by mouth 2 (two) times daily. For the next 2 days as you did receive IV contrast, then resume on 11/26/2018 60 tablet 11  . metoprolol tartrate (LOPRESSOR) 50 MG tablet Take 50 mg by mouth 2 (two) times daily.     [name] omeprazole (PRILOSEC) 20 MG capsule Take 20 mg by mouth 2 (two) times daily before a meal.    . pravastatin (PRAVACHOL) 20 MG tablet Take 1 tablet (20 mg total) by mouth  daily. 10 tablet 0  . topiramate (TOPAMAX) 25 MG capsule Take 50 mg by mouth 2 (two) times daily.     [name] FLEXTOUCH 100 UNIT/ML SOPN FlexTouch Pen Inject 70 Units into the skin at bedtime.   5  . nitroGLYCERIN (NITROSTAT) 0.4 MG SL tablet Place 1 tablet (0.4 mg total) under the tongue every 5 (five) minutes x 3 doses as needed for chest pain (if no relief after 3rd dose, proceed to the ED for an evaluation or call 911). 25 tablet 3   No current facility-administered medications on file prior to visit.    Allergies:   Allergies  Allergen Reactions  . Divalproex Sodium Other (See Comments)    Causes anger  . Statins Other (See Comments)    Muscle aches and cramps  . Tramadol Other (See Comments)    Chest pain   . Valproic Acid Other (See Comments)    "anger"  . Gadolinium Derivatives Nausea And Vomiting    07/25/19 Pt vomited immediately after IV gad. Denies itching, dyspnea.  (Adverse, not allergic, reaction  . Tricor [Fenofibrate] Other (See Comments)    Leg cramps  Physical Exam  Vitals:   01/17/20 1546  BP: 124/74  Pulse: 70  Temp: 97.6 F (36.4 C)  Weight: 236 lb 9.6 oz (107.3 kg)  Height: 5' 9"  (1.753 m)   Body mass index is 34.94 kg/m. No exam data present  General: well developed, well nourished, pleasant middle-aged Caucasian [name], seated, in no evident distress Head: head normocephalic and atraumatic.   Neck: supple with no carotid or supraclavicular bruits Cardiovascular: regular rate and rhythm, no murmurs Musculoskeletal: no deformity Skin:  no rash/petichiae Vascular:  Normal pulses all extremities  Neurologic Exam Mental Status: Awake and fully alert. Oriented to place and time. Recent and remote memory intact. Attention span, concentration and fund of knowledge appropriate. Mood and affect appropriate.  Cranial Nerves: Pupils equal, briskly reactive to light. Extraocular movements 2 beat nystagmus with horizontal gaze and rapid movement  bilaterally with convergence.  Visual fields full to confrontation. facial sensation intact. Face, tongue, palate moves normally and symmetrically.  Motor: Normal bulk and tone. Normal strength in all tested extremity muscles.   Sensory.:  Lack of light touch and vibratory sensation bilateral lower extremities distally (chronic) Coordination: Rapid alternating movements normal in all extremities. Finger-to-nose and heel-to-shin performed accurately bilaterally.  Gait and Station: Arises from chair with mild difficulty. Stance is normal. Gait demonstrates short shuffled steps with minimal arm swing and mild imbalance.  Romberg negative.  Able to heel toe with mild difficulty. Reflexes: 1+ and symmetric. Toes downgoing.      Diagnostic Data (Labs, Imaging, Testing)  MR BRAIN WO CONTRAST 07/25/2019 IMPRESSION:  MRI brain (without) demonstrating: - Mild scattered periventricular and subcortical foci of non-specific gliosis. No acute imaging findings. - IV contrast reaction; post-contrast imaging could not be obtained.      ASSESSMENT: [name] [name] is a 64 y.o. year old [name] with strokelike episode S/P IV TPA on 07/08/2018. Vascular risk factors include HTN, HLD, DM, OSA, CAD and CHF.  Residual deficit of blurred vision/diplopia.  Onset of daily vertigo associated with tinnitus, worsening blurred vision and hearing loss.  MRI wo contrast unremarkable (intolerant to contrast).   PLAN: -Evaluation scheduled by neuro-ophthalmology.  If unremarkable evaluation, will consider referral to neurotology  -Continue aspirin 81 mg daily  and pravastatin and Zetia for secondary stroke prevention -F/u with PCP regarding your HLD, HTN and DM management -Advised patient to ensure he obtains CPAP machine for sleep apnea management -he will call office once initiated to schedule follow-up visit 6 weeks after start -continue to monitor BP at home -advised to continue to stay active and maintain a healthy  diet -Maintain strict control of hypertension with blood pressure goal below 130/90, diabetes with hemoglobin A1c goal below 6.5% and cholesterol with LDL cholesterol (bad cholesterol) goal below 70 mg/dL. I also advised the patient to eat a healthy diet with plenty of whole grains, cereals, fruits and vegetables, exercise regularly and maintain ideal body weight.  Follow-up 6 weeks after initiating CPAP   I spent 31 minutes of face-to-face and non-face-to-face time with patient.  This included previsit chart review, lab review, study review, order entry, electronic health record documentation, patient education     Frann Rider, [facility]  [facility] [address] [place], [place] [address]  Phone [phone] Fax [phone] Note: This document was prepared with digital dictation and possible smart phrase technology. Any transcriptional errors that result from this process are unintentional.

## 2020-01-18 ENCOUNTER — Ambulatory Visit: Payer: Self-pay | Admitting: Adult Health

## 2020-01-18 ENCOUNTER — Telehealth: Payer: Self-pay | Admitting: Adult Health

## 2020-01-18 DIAGNOSIS — G4733 Obstructive sleep apnea (adult) (pediatric): Secondary | ICD-10-CM

## 2020-01-18 NOTE — Telephone Encounter (Signed)
Rep with [facility] called stating they received orders for the pt but were needing the pts sleep study and initial face to face OV notes.

## 2020-01-18 NOTE — Telephone Encounter (Signed)
Fax confirmation received [facility] for sleep study, cpap titration,  Initial office note and note from 01-18-20 visit. (37pages). [phone].[name]

## 2020-01-18 NOTE — Telephone Encounter (Signed)
Rep with [facility] called to inform based on the pts sleep study results he does not qualify through his insurance for the bipap and the pt is wanting to go through his insurance

## 2020-01-22 NOTE — Telephone Encounter (Signed)
I called and spoke to [place] at [facility] pt does not qualify for [facility] on bipap as central apneas are not 1/2 of the apneas seen on sleep study.

## 2020-01-24 NOTE — Telephone Encounter (Signed)
Please call [facility] for his BiPAP, would he qualify for auto BiPAP therapy based on the sleep studies we have available?  He did not do well enough on CPAP alone.  He did not do well on standard BiPAP or BiPAP ST for that matter but did best on BiPAP ST but since he does not qualify for it, could we potentially try auto BiPAP therapy?

## 2020-01-25 NOTE — Telephone Encounter (Signed)
Fess,[name] [name](on DPR) is calling stating after sleep study pt was told he would not qualify for a CPAP and that he would need a BIPAP. Wife states [name] states they need an order from Dr Rexene Alberts for a BIPAP, please call

## 2020-01-25 NOTE — Telephone Encounter (Signed)
autoBiPAP order placed.

## 2020-01-25 NOTE — Telephone Encounter (Signed)
Rep  with [facility] called to speak to RN in regards to patient states rn can ask for wanda or erica CB#[phone]

## 2020-01-25 NOTE — Telephone Encounter (Signed)
I spoke to [place] in respiratory at [facility] that will proceed with the auto bipap orders. (pt will pay out of pocket and then be reimbursed by medicare).

## 2020-01-25 NOTE — Addendum Note (Signed)
Addended by: Star Age on: 01/25/2020 03:32 PM   Modules accepted: Orders

## 2020-01-25 NOTE — Telephone Encounter (Signed)
I called and spoke to RT about pt, she will have to look into this and call us back.  Did fax to [facility] the recommendations per Dr. Rexene Alberts.

## 2020-01-29 NOTE — Telephone Encounter (Signed)
Order for auto bipap faxed to [facility] [phone] (confirmation received).

## 2020-02-12 NOTE — Progress Notes (Signed)
I agree with the above plan 

## 2020-02-19 ENCOUNTER — Other Ambulatory Visit: Payer: Self-pay

## 2020-02-19 ENCOUNTER — Observation Stay (HOSPITAL_COMMUNITY)
Admission: EM | Admit: 2020-02-19 | Discharge: 2020-02-20 | Disposition: A | Discharge status: Home / Self Care | Payer: Medicare Other | Attending: Internal Medicine | Admitting: Internal Medicine

## 2020-02-19 ENCOUNTER — Emergency Department (HOSPITAL_COMMUNITY): Payer: Medicare Other

## 2020-02-19 ENCOUNTER — Encounter (HOSPITAL_COMMUNITY): Payer: Self-pay | Admitting: Radiology

## 2020-02-19 DIAGNOSIS — I5031 Acute diastolic (congestive) heart failure: Secondary | ICD-10-CM | POA: Insufficient documentation

## 2020-02-19 DIAGNOSIS — E119 Type 2 diabetes mellitus without complications: Secondary | ICD-10-CM | POA: Diagnosis not present

## 2020-02-19 DIAGNOSIS — I10 Essential (primary) hypertension: Secondary | ICD-10-CM | POA: Diagnosis not present

## 2020-02-19 DIAGNOSIS — Z79899 Other long term (current) drug therapy: Secondary | ICD-10-CM | POA: Diagnosis not present

## 2020-02-19 DIAGNOSIS — E1165 Type 2 diabetes mellitus with hyperglycemia: Secondary | ICD-10-CM

## 2020-02-19 DIAGNOSIS — I110 Hypertensive heart disease with heart failure: Secondary | ICD-10-CM | POA: Insufficient documentation

## 2020-02-19 DIAGNOSIS — I639 Cerebral infarction, unspecified: Secondary | ICD-10-CM | POA: Diagnosis present

## 2020-02-19 DIAGNOSIS — R29810 Facial weakness: Secondary | ICD-10-CM

## 2020-02-19 DIAGNOSIS — E1100 Type 2 diabetes mellitus with hyperosmolarity without nonketotic hyperglycemic-hyperosmolar coma (NKHHC): Secondary | ICD-10-CM | POA: Diagnosis not present

## 2020-02-19 DIAGNOSIS — E1169 Type 2 diabetes mellitus with other specified complication: Secondary | ICD-10-CM

## 2020-02-19 DIAGNOSIS — M6281 Muscle weakness (generalized): Secondary | ICD-10-CM | POA: Insufficient documentation

## 2020-02-19 DIAGNOSIS — Z951 Presence of aortocoronary bypass graft: Secondary | ICD-10-CM | POA: Insufficient documentation

## 2020-02-19 DIAGNOSIS — K219 Gastro-esophageal reflux disease without esophagitis: Secondary | ICD-10-CM | POA: Diagnosis present

## 2020-02-19 DIAGNOSIS — F419 Anxiety disorder, unspecified: Secondary | ICD-10-CM | POA: Diagnosis present

## 2020-02-19 DIAGNOSIS — G459 Transient cerebral ischemic attack, unspecified: Secondary | ICD-10-CM | POA: Diagnosis not present

## 2020-02-19 DIAGNOSIS — Z7982 Long term (current) use of aspirin: Secondary | ICD-10-CM | POA: Diagnosis not present

## 2020-02-19 DIAGNOSIS — I25110 Atherosclerotic heart disease of native coronary artery with unstable angina pectoris: Secondary | ICD-10-CM | POA: Insufficient documentation

## 2020-02-19 DIAGNOSIS — E782 Mixed hyperlipidemia: Secondary | ICD-10-CM | POA: Diagnosis not present

## 2020-02-19 DIAGNOSIS — Z20822 Contact with and (suspected) exposure to covid-19: Secondary | ICD-10-CM | POA: Insufficient documentation

## 2020-02-19 DIAGNOSIS — R29818 Other symptoms and signs involving the nervous system: Secondary | ICD-10-CM

## 2020-02-19 DIAGNOSIS — R4182 Altered mental status, unspecified: Secondary | ICD-10-CM

## 2020-02-19 DIAGNOSIS — Z794 Long term (current) use of insulin: Secondary | ICD-10-CM | POA: Insufficient documentation

## 2020-02-19 LAB — DIFFERENTIAL
Abs Immature Granulocytes: 0.01 10*3/uL (ref 0.00–0.07)
Basophils Absolute: 0 10*3/uL (ref 0.0–0.1)
Basophils Relative: 1 %
Eosinophils Absolute: 0.1 10*3/uL (ref 0.0–0.5)
Eosinophils Relative: 3 %
Immature Granulocytes: 1 %
Lymphocytes Relative: 37 %
Lymphs Abs: 0.7 10*3/uL (ref 0.7–4.0)
Monocytes Absolute: 0.2 10*3/uL (ref 0.1–1.0)
Monocytes Relative: 8 %
Neutro Abs: 1 10*3/uL — ABNORMAL LOW (ref 1.7–7.7)
Neutrophils Relative %: 50 %

## 2020-02-19 LAB — COMPREHENSIVE METABOLIC PANEL
ALT: 40 U/L (ref 0–44)
AST: 41 U/L (ref 15–41)
Albumin: 3.3 g/dL — ABNORMAL LOW (ref 3.5–5.0)
Alkaline Phosphatase: 50 U/L (ref 38–126)
Anion gap: 9 (ref 5–15)
BUN: 15 mg/dL (ref 8–23)
CO2: 21 mmol/L — ABNORMAL LOW (ref 22–32)
Calcium: 8.5 mg/dL — ABNORMAL LOW (ref 8.9–10.3)
Chloride: 105 mmol/L (ref 98–111)
Creatinine, Ser: 1 mg/dL (ref 0.61–1.24)
GFR calc Af Amer: >60 mL/min (ref >60)
GFR calc non Af Amer: >60 mL/min (ref >60)
Glucose, Bld: 269 mg/dL — ABNORMAL HIGH (ref 70–99)
Potassium: 4.2 mmol/L (ref 3.5–5.1)
Sodium: 135 mmol/L (ref 135–145)
Total Bilirubin: 0.6 mg/dL (ref 0.3–1.2)
Total Protein: 6.6 g/dL (ref 6.5–8.1)

## 2020-02-19 LAB — CBC
HCT: 42.5 % (ref 39.0–52.0)
Hemoglobin: 14.7 g/dL (ref 13.0–17.0)
MCH: 31.5 pg (ref 26.0–34.0)
MCHC: 34.6 g/dL (ref 30.0–36.0)
MCV: 91 fL (ref 80.0–100.0)
Platelets: 73 10*3/uL — ABNORMAL LOW (ref 150–400)
RBC: 4.67 MIL/uL (ref 4.22–5.81)
RDW: 13.4 % (ref 11.5–15.5)
WBC: 1.9 10*3/uL — ABNORMAL LOW (ref 4.0–10.5)
nRBC: 0 % (ref 0.0–0.2)

## 2020-02-19 LAB — PROTIME-INR
INR: 1.2 (ref 0.8–1.2)
Prothrombin Time: 14.9 seconds (ref 11.4–15.2)

## 2020-02-19 LAB — GLUCOSE, CAPILLARY: Glucose-Capillary: 242 mg/dL — ABNORMAL HIGH (ref 70–99)

## 2020-02-19 LAB — APTT: aPTT: 33 seconds (ref 24–36)

## 2020-02-19 LAB — CBG MONITORING, ED
Glucose-Capillary: 278 mg/dL — ABNORMAL HIGH (ref 70–99)
Glucose-Capillary: 269 mg/dL — ABNORMAL HIGH (ref 70–99)

## 2020-02-19 LAB — ETHANOL: Alcohol, Ethyl (B): <10 mg/dL (ref <10)

## 2020-02-19 MED ORDER — CITALOPRAM HYDROBROMIDE 20 MG PO TABS
20.0000 mg | ORAL_TABLET | Freq: Every day | ORAL | Status: DC
  Administered 2020-02-19: 20 mg via ORAL
  Filled 2020-02-19 (×3): qty 1

## 2020-02-19 MED ORDER — MELATONIN 3 MG PO TABS: 9.0000 mg | ORAL_TABLET | Freq: Every evening | ORAL | Status: DC | PRN

## 2020-02-19 MED ORDER — ALBUTEROL SULFATE HFA 108 (90 BASE) MCG/ACT IN AERS: 2.0000 | INHALATION_SPRAY | Freq: Four times a day (QID) | RESPIRATORY_TRACT | Status: DC | PRN

## 2020-02-19 MED ORDER — MOMETASONE FURO-FORMOTEROL FUM 200-5 MCG/ACT IN AERO
2.0000 | INHALATION_SPRAY | Freq: Two times a day (BID) | RESPIRATORY_TRACT | Status: DC
  Administered 2020-02-19 – 2020-02-20 (×2): 2 via RESPIRATORY_TRACT
  Filled 2020-02-19: qty 8.8

## 2020-02-19 MED ORDER — ATORVASTATIN CALCIUM 40 MG PO TABS
40.0000 mg | ORAL_TABLET | Freq: Once | ORAL | Status: AC
  Administered 2020-02-19: 40 mg via ORAL
  Filled 2020-02-19: qty 1

## 2020-02-19 MED ORDER — TOPIRAMATE 25 MG PO TABS
50.0000 mg | ORAL_TABLET | Freq: Two times a day (BID) | ORAL | Status: DC
  Administered 2020-02-19 – 2020-02-20 (×2): 50 mg via ORAL
  Filled 2020-02-19 (×2): qty 2

## 2020-02-19 MED ORDER — INSULIN GLARGINE 100 UNIT/ML ~~LOC~~ SOLN
35.0000 [IU] | Freq: Every day | SUBCUTANEOUS | Status: DC
  Administered 2020-02-19: 35 [IU] via SUBCUTANEOUS
  Filled 2020-02-19 (×2): qty 0.35

## 2020-02-19 MED ORDER — EZETIMIBE 10 MG PO TABS
10.0000 mg | ORAL_TABLET | Freq: Every day | ORAL | Status: DC
  Administered 2020-02-20: 10 mg via ORAL
  Filled 2020-02-19 (×3): qty 1

## 2020-02-19 MED ORDER — HYDROCODONE-ACETAMINOPHEN 5-325 MG PO TABS
1.0000 | ORAL_TABLET | Freq: Four times a day (QID) | ORAL | Status: DC | PRN
  Administered 2020-02-19: 1 via ORAL
  Filled 2020-02-19: qty 1

## 2020-02-19 MED ORDER — CLOPIDOGREL BISULFATE 75 MG PO TABS
75.0000 mg | ORAL_TABLET | Freq: Every day | ORAL | Status: DC
  Administered 2020-02-20: 75 mg via ORAL
  Filled 2020-02-19: qty 1

## 2020-02-19 MED ORDER — SODIUM CHLORIDE 0.9% FLUSH
3.0000 mL | Freq: Two times a day (BID) | INTRAVENOUS | Status: DC
  Administered 2020-02-19 – 2020-02-20 (×2): 3 mL via INTRAVENOUS

## 2020-02-19 MED ORDER — STROKE: EARLY STAGES OF RECOVERY BOOK
Freq: Once | Status: AC
  Filled 2020-02-19: qty 1

## 2020-02-19 MED ORDER — FLUTICASONE PROPIONATE 50 MCG/ACT NA SUSP
1.0000 | Freq: Every day | NASAL | Status: DC
  Administered 2020-02-20: 1 via NASAL
  Filled 2020-02-19 (×2): qty 16

## 2020-02-19 MED ORDER — ASPIRIN EC 81 MG PO TBEC
81.0000 mg | DELAYED_RELEASE_TABLET | Freq: Every day | ORAL | Status: DC
  Administered 2020-02-19: 81 mg via ORAL
  Filled 2020-02-19: qty 1

## 2020-02-19 MED ORDER — INSULIN ASPART 100 UNIT/ML ~~LOC~~ SOLN
0.0000 [IU] | Freq: Every day | SUBCUTANEOUS | Status: DC
  Administered 2020-02-19: 2 [IU] via SUBCUTANEOUS

## 2020-02-19 MED ORDER — TIZANIDINE HCL 4 MG PO TABS: 4.0000 mg | ORAL_TABLET | Freq: Four times a day (QID) | ORAL | Status: DC | PRN

## 2020-02-19 MED ORDER — IOHEXOL 350 MG/ML SOLN
100.0000 mL | Freq: Once | INTRAVENOUS | Status: AC | PRN
  Administered 2020-02-19: 100 mL via INTRAVENOUS

## 2020-02-19 MED ORDER — INSULIN ASPART 100 UNIT/ML ~~LOC~~ SOLN
0.0000 [IU] | Freq: Three times a day (TID) | SUBCUTANEOUS | Status: DC
  Administered 2020-02-20: 2 [IU] via SUBCUTANEOUS
  Administered 2020-02-20: 3 [IU] via SUBCUTANEOUS

## 2020-02-19 NOTE — H&P (Signed)
History and Physical    PLEASE NOTE THAT DRAGON DICTATION SOFTWARE WAS USED IN THE CONSTRUCTION OF THIS NOTE.   [name] [name] NLG:[id number] DOB: 1956/04/01 DOA: 02/19/2020  PCP: Terrial Rhodes, MD Patient coming from: home   I have personally briefly reviewed patient's old medical records in [place]  Chief Complaint: Left-sided facial droop  HPI: [name] [name] is a 64 y.o. male with medical history significant for type 2 diabetes mellitus, chronic thrombocytopenia, hyperlipidemia, hypertension, chronic low back pain, who is admitted to [facility] on 02/19/2020 with suspected acute ischemic CVA after presenting from home to [facility] Emergency Department complaining of left-sided facial droop.   The patient reports that he went to bed around 2300 on 02/18/2020, at which time he reports that he was not experiencing any acute focal neurologic deficits, representing his last known normal.  Around 10 AM on 02/19/2020, the patient reports that he awoke and was experiencing left-sided facial droop confirmed by his wife.  Otherwise he denies any associated additional acute focal neurologic deficits, including no acute focal weakness, paresthesias, numbness, dysphagia, dizziness, vertigo, nausea, vomiting, change in vision, blurry vision, diplopia, word or finding difficulties, or slurring of speech.  He does however report that he was experiencing a mild frontal lobe headache at the time that he awoke today, but that this has subsequently subsided   Per chart review it appears that the patient has a history of TIA in 2010, although the presenting acute focal neurologic deficits at that time or not clear.  He acknowledges a history of hypertension, hyperlipidemia, diabetes, and obstructive sleep apnea but denies any history of known underlying atrial fibrillation.  He reports good compliance with his outpatient antihypertensive regimen as well as basal and short acting insulin as well as Metformin.   He also reports good compliance with his home Zetia and pravastatin.  He is on a daily baby aspirin, but is otherwise on no blood thinning agents at home.  Reports that he is a lifelong non-smoker.  He acknowledges a history of struct of sleep apnea, but conveys that he has not been compliant with recommendations for home nocturnal CPAP.  Denies any associated subjective fever, chills, rigors, or generalized myalgias. Denies any recent neck stiffness, rhinitis, rhinorrhea, sore throat, sob, cough, abdominal pain, diarrhea, or rash. No recent traveling or known COVID-19 exposures. Denies dysuria, gross hematuria, or change in urinary urgency/frequency.  Denies any recent chest pain, diaphoresis, or palpitations.  Subsequently, the patient was brought to [facility] emergency department in the early afternoon of 02/19/2020 for evaluation of persistent, but improving left-sided facial droop.  He was noted to arrive in the emergency department today 1324.     ED Course:  Vital signs in the ED were notable for the following: Temperature max 98.8; heart rate 63-67; blood pressure 126/76 - 142/83; respiratory rate 14-16; oxygen saturation 96 to 97% on room air.  Labs were notable for the following: CMP notable for the following: Sodium 135, bicarbonate 21, anion gap 9, creatinine 1.0, glucose 269.  CBC notable for white blood cell count of 1900 with 50% neutrophils, hemoglobin 14.7, and platelets 73 relative to most recent prior value of 105 on 08/30/2019.  INR 1.2.  Nasopharyngeal COVID-19 PCR swab was obtained, and result is currently pending.  Noncontrast CT that showed no evidence of acute intracranial process, including no evidence of acute intracranial hemorrhage or acute ischemic infarct.  CTA of the head and neck showed no evidence of dissection,  aneurysm, thrombus, or hemodynamically significant stenosis.  Telestroke neurology consult occurred in the ED, with telestroke neurologist, Dr. Vickki Muff,  conveying his suspicion of acute ischemic infarct. Per Dr. Vickki Muff, there is not an indication for TPA administration as the patient presents well outside of window for administration of such.  Rather, Dr. Vickki Muff recommends admission to the hospitalist service for further evaluation and management of suspected acute ischemic CVA, including pursuit of MRI of the brain as well as echocardiogram with bubble study.  He also recommends expansion of evaluation for potentially modifiable CVA risk factors, specifically recommending evaluation of lipid panel and hemoglobin A1c.  Additionally, he recommends a course of dual antiplatelet therapy by adding Plavix to existing home daily baby aspirin.     Review of Systems: As per HPI otherwise 10 point review of systems negative.   Past Medical History:  Diagnosis Date  . Anxiety   . Arthritis   . Asthma   . Cataract    Right eye  . Chronic lower back pain   . Cirrhosis of liver ([place])   . Coronary atherosclerosis of native coronary artery    a. LAD & OM stenting;  b. 2007 Cath: nonobs dzs, patent stents;  c. 07/2012 neg MV, EF 61%.  d. 11/20/14: [place] s/p  PCI w/ DES to pLAD and DES to OM1   . Essential hypertension   . GERD (gastroesophageal reflux disease)   . Hearing loss of left ear   . History of gout   . History of hiatal hernia   . Hypercholesteremia   . Lumbar herniated disc   . MI, old   . Migraine   . OSA (obstructive sleep apnea)   . 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 Thrombocytopenia ([place])   . TIA (transient ischemic attack) ~ 2010  . Type 2 diabetes mellitus ([place])     Past Surgical History:  Procedure Laterality Date  . ANTERIOR CERVICAL DECOMP/DISCECTOMY FUSION  1998   "C3-4"  . CARDIAC CATHETERIZATION  "several"  . CARDIAC CATHETERIZATION N/A 12/30/2015   Procedure: Left Heart Cath and Coronary Angiography;  Surgeon: [name], MD;  Location: [place]  CV LAB;  Service: Cardiovascular;  Laterality: N/A;  . CARPAL TUNNEL RELEASE Bilateral 2005  . CHOLECYSTECTOMY N/A 11/28/2018   Procedure: LAPAROSCOPIC CHOLECYSTECTOMY;  Surgeon: [name], MD;  Location: [facility];  Service: General;  Laterality: N/A;  . COLONOSCOPY    . CORONARY ANGIOPLASTY    . CORONARY ANGIOPLASTY WITH STENT PLACEMENT  2002; 2003; 11/20/2014   "I have 4 stents after today" (11/20/2014)  . CORONARY ARTERY BYPASS GRAFT N/A 01/09/2016   Procedure: CORONARY ARTERY BYPASS GRAFTING (CABG) X 3 UTILIZING RIGHT AND LEFT INTERNAL MAMMARY ARTERY AND ENDOSCOPICALLY HARVESTED SAPHENEOUS VEIN.;  Surgeon: Rexene Alberts, MD;  Location: [place];  Service: Open Heart Surgery;  Laterality: N/A;  . ESOPHAGOGASTRODUODENOSCOPY    . KNEE SURGERY Left 02/2012   "scraped; open"  . LEFT HEART CATH AND CORS/GRAFTS ANGIOGRAPHY N/A 08/09/2017   Procedure: LEFT HEART CATH AND CORS/GRAFTS ANGIOGRAPHY;  Surgeon: [name], MD;  Location: [place] CV LAB;  Service: Cardiovascular;  Laterality: N/A;  . LEFT HEART CATH AND CORS/GRAFTS ANGIOGRAPHY N/A 11/22/2018   Procedure: LEFT HEART CATH AND CORS/GRAFTS ANGIOGRAPHY;  Surgeon: [name], [name], MD;  Location: [place] CV LAB;  Service: Cardiovascular;  Laterality: N/A;  . LEFT  HEART CATHETERIZATION WITH CORONARY ANGIOGRAM N/A 07/20/2012   Procedure: LEFT HEART CATHETERIZATION WITH CORONARY ANGIOGRAM;  Surgeon: [name], MD;  Location: [place] CATH LAB;  Service: Cardiovascular;  Laterality: N/A;  . LEFT HEART CATHETERIZATION WITH CORONARY ANGIOGRAM N/A 11/20/2014   Procedure: LEFT HEART CATHETERIZATION WITH CORONARY ANGIOGRAM;  Surgeon: [name], MD;  Location: [facility] CATH LAB;  Service: Cardiovascular;  Laterality: N/A;  . LEFT HEART CATHETERIZATION WITH CORONARY ANGIOGRAM N/A 11/26/2014   Procedure: LEFT HEART CATHETERIZATION WITH CORONARY ANGIOGRAM;  Surgeon: [name], MD;  Location: [facility] CATH LAB;  Service: Cardiovascular;  Laterality:  N/A;  . NEUROPLASTY / TRANSPOSITION ULNAR NERVE AT ELBOW Right ~ 2012  . PERCUTANEOUS CORONARY ROTOBLATOR INTERVENTION (PCI-R)  11/20/2014   Procedure: PERCUTANEOUS CORONARY ROTOBLATOR INTERVENTION (PCI-R);  Surgeon: [name], MD;  Location: [facility] CATH LAB;  Service: Cardiovascular;;  . SHOULDER ARTHROSCOPY Left ~ 2011  . TEE WITHOUT CARDIOVERSION N/A 01/09/2016   Procedure: TRANSESOPHAGEAL ECHOCARDIOGRAM (TEE);  Surgeon: Rexene Alberts, MD;  Location: [place];  Service: Open Heart Surgery;  Laterality: N/A;    Social History:  reports that he has never smoked. He has never used smokeless tobacco. He reports that he does not drink alcohol or use drugs.   Allergies  Allergen Reactions  . Divalproex Sodium Other (See Comments)    Causes anger  . Statins Other (See Comments)    Muscle aches and cramps  . Tramadol Other (See Comments)    Chest pain   . Valproic Acid Other (See Comments)    "anger"  . Gadolinium Derivatives Nausea And Vomiting    07/25/19 Pt vomited immediately after IV gad. Denies itching, dyspnea.  (Adverse, not allergic, reaction  . Tricor [Fenofibrate] Other (See Comments)    Leg cramps    Family History  Problem Relation Age of Onset  . Stroke Mother   . Coronary artery disease Father 62  . Asthma Sister   . Multiple sclerosis Brother   . Heart disease Paternal Uncle   . Stroke Maternal Grandmother   . Heart attack Paternal Grandmother   . Cancer Paternal Grandfather   . Asthma Sister   . Seizures Son   . Migraines Son   . Autism Son   . Migraines Son      Prior to Admission medications   Medication Sig Start Date End Date Taking? Authorizing Provider  albuterol (PROVENTIL HFA;VENTOLIN HFA) 108 (90 BASE) MCG/ACT inhaler Inhale 2 puffs into the lungs every 6 (six) hours as needed for wheezing or shortness of breath.    [provider]  amLODipine (NORVASC) 5 MG tablet Take 5 mg by mouth at bedtime. 06/16/18   [provider]  aspirin EC  81 MG tablet Take 81 mg by mouth at bedtime.     [provider]  citalopram (CELEXA) 20 MG tablet Take 20 mg by mouth at bedtime.     [provider]  Cyanocobalamin (VITAMIN B-12 PO) Take 1,200 mg by mouth daily.    [provider]  diazepam (VALIUM) 5 MG tablet Take 5 mg by mouth every 6 (six) hours as needed for anxiety.    [provider]  ergocalciferol (VITAMIN D2) 1.25 MG (50000 UT) capsule Take 1 capsule (50,000 Units total) by mouth once a week. 05/03/19   Lockamy, Randi L, NP-C  ezetimibe (ZETIA) 10 MG tablet Take 10 mg by mouth daily.    [provider]  ferrous sulfate 325 (65 FE) MG EC  tablet Take 325 mg by mouth 3 (three) times daily with meals.    [provider]  fluticasone (FLONASE) 50 MCG/ACT nasal spray Place 1 spray into the nose 2 (two) times daily.     [provider]  Fluticasone-Salmeterol (ADVAIR) 250-50 MCG/DOSE AEPB Inhale 1 puff into the lungs every 12 (twelve) hours.    [provider]  furosemide (LASIX) 20 MG tablet Take 20 mg by mouth daily. 10/14/19   [provider]  gabapentin (NEURONTIN) 300 MG capsule Take 300 mg by mouth 3 (three) times daily. 07/09/17   [provider]  insulin aspart (NOVOLOG FLEXPEN) 100 UNIT/ML FlexPen Inject 2-30 Units into the skin 3 (three) times daily as needed for high blood sugar (CBG >150). Per sliding scale written down at home     [provider]  isosorbide mononitrate (IMDUR) 30 MG 24 hr tablet Take 30 mg by mouth at bedtime. 06/16/18   [provider]  Melatonin 5 MG TABS Take 10 mg by mouth at bedtime.     [provider]  metFORMIN (GLUCOPHAGE) 1000 MG tablet Take 1 tablet (1,000 mg total) by mouth 2 (two) times daily. For the next 2 days as you did receive IV contrast, then resume on 11/26/2018 11/23/18   Elgergawy, Silver Huguenin, MD  metoprolol tartrate (LOPRESSOR) 50 MG tablet Take 50 mg by mouth 2 (two) times daily.      [provider]  nitroGLYCERIN (NITROSTAT) 0.4 MG SL tablet Place 1 tablet (0.4 mg total) under the tongue every 5 (five) minutes x 3 doses as needed for chest pain (if no relief after 3rd dose, proceed to the ED for an evaluation or call 911). 04/10/19 10/17/19  [name], MD  omeprazole (PRILOSEC) 20 MG capsule Take 20 mg by mouth 2 (two) times daily before a meal.    [provider]  pravastatin (PRAVACHOL) 20 MG tablet Take 1 tablet (20 mg total) by mouth daily. 09/13/18   [name], MD  topiramate (TOPAMAX) 25 MG capsule Take 50 mg by mouth 2 (two) times daily.     [provider]  TRESIBA FLEXTOUCH 100 UNIT/ML SOPN FlexTouch Pen Inject 70 Units into the skin at bedtime.  08/11/18   [provider]     Objective    Physical Exam: Vitals:   02/19/20 1400  BP: (!) 142/83  Pulse: 69  Resp: 12  Temp: 98.8 F (37.1 C)  TempSrc: Oral  SpO2: 99%    General: appears to be stated age; alert, oriented Skin: warm, dry, no rash Head:  AT/[place] Eyes:  PEARL b/l, EOMI Mouth:  Oral mucosa membranes appear moist, normal dentition Neck: supple; trachea midline Heart:  RRR; did not appreciate any M/R/G Lungs: CTAB, did not appreciate any wheezes, rales, or rhonchi Abdomen: + BS; soft, ND, NT Vascular: 2+ pedal pulses b/l; 2+ radial pulses b/l Extremities: no peripheral edema, no muscle wasting Neuro: 5/5 strength of the proximal and distal flexors and extensors of the upper and lower extremities bilaterally; sensation intact in upper and lower extremities b/l; left-sided facial droop noted, otherwise cranial nerves II through XII grossly intact; no pronator drift; no evidence suggestive of slurred speech or dysarthria; normal heel-to-shin bilaterally; normal finger to nose bilaterally; no evidence of dysdiadochokinesia. Normal muscle tone. No tremors. 2+ DTR's in upper and lower extremities bilaterally.    Labs on Admission: I have personally  reviewed following labs and imaging studies  CBC: Recent Labs  Lab  02/19/20 1438  WBC 1.9*  NEUTROABS 1.0*  HGB 14.7  HCT 42.5  MCV 91.0  PLT 73*   Basic Metabolic Panel: Recent Labs  Lab 02/19/20 1438  NA 135  K 4.2  CL 105  CO2 21*  GLUCOSE 269*  BUN 15  CREATININE 1.00  CALCIUM 8.5*   GFR: CrCl cannot be calculated (Unknown ideal weight.). Liver Function Tests: Recent Labs  Lab 02/19/20 1438  AST 41  ALT 40  ALKPHOS 50  BILITOT 0.6  PROT 6.6  ALBUMIN 3.3*   No results for input(s): LIPASE, AMYLASE in the last 168 hours. No results for input(s): AMMONIA in the last 168 hours. Coagulation Profile: Recent Labs  Lab 02/19/20 1438  INR 1.2   Cardiac Enzymes: No results for input(s): CKTOTAL, CKMB, CKMBINDEX, TROPONINI in the last 168 hours. BNP (last 3 results) No results for input(s): PROBNP in the last 8760 hours. HbA1C: No results for input(s): HGBA1C in the last 72 hours. CBG: Recent Labs  Lab 02/19/20 1401 02/19/20 1432  GLUCAP 278* 269*   Lipid Profile: No results for input(s): CHOL, HDL, LDLCALC, TRIG, CHOLHDL, LDLDIRECT in the last 72 hours. Thyroid Function Tests: No results for input(s): TSH, T4TOTAL, FREET4, T3FREE, THYROIDAB in the last 72 hours. Anemia Panel: No results for input(s): VITAMINB12, FOLATE, FERRITIN, TIBC, IRON, RETICCTPCT in the last 72 hours. Urine analysis:    Component Value Date/Time   COLORURINE AMBER (A) 11/25/2018 2144   APPEARANCEUR HAZY (A) 11/25/2018 2144   LABSPEC 1.028 11/25/2018 2144   PHURINE 5.0 11/25/2018 2144   GLUCOSEU NEGATIVE 11/25/2018 2144   HGBUR SMALL (A) 11/25/2018 2144   BILIRUBINUR NEGATIVE 11/25/2018 2144   KETONESUR NEGATIVE 11/25/2018 2144   PROTEINUR 100 (A) 11/25/2018 2144   UROBILINOGEN 0.2 12/04/2013 0730   NITRITE NEGATIVE 11/25/2018 2144   LEUKOCYTESUR NEGATIVE 11/25/2018 2144    Radiological Exams on Admission: CT Angio Head W or Wo Contrast  Result Date:  02/19/2020 CLINICAL DATA:  Left-sided facial droop EXAM: CT ANGIOGRAPHY HEAD AND NECK CT PERFUSION BRAIN TECHNIQUE: Multidetector CT imaging of the head and neck was performed using the standard protocol during bolus administration of intravenous contrast. Multiplanar CT image reconstructions and MIPs were obtained to evaluate the vascular anatomy. Carotid stenosis measurements (when applicable) are obtained utilizing NASCET criteria, using the distal internal carotid diameter as the denominator. Multiphase CT imaging of the brain was performed following IV bolus contrast injection. Subsequent parametric perfusion maps were calculated using RAPID software. CONTRAST:  174m OMNIPAQUE IOHEXOL 350 MG/ML SOLN COMPARISON:  None. FINDINGS: CTA NECK FINDINGS Aortic arch: Great vessel origins are patent. Right carotid system: Patent. Mild calcified plaque along the common carotid. Primarily calcified plaque along the proximal internal carotid causing minimal stenosis. Left carotid system: Patent. Primarily calcified plaque along the proximal internal carotid causing less than 50% stenosis. Vertebral arteries: Patent. Left vertebral artery is dominant. There is plaque at the left vertebral artery origin and along the proximal V1 segment with moderate stenosis. Plaque at the right vertebral artery origin also likely causes at least moderate stenosis. Skeleton: Cervical spine degenerative changes, primarily facet hypertrophy. Other neck: No mass or adenopathy. Upper chest: No apical lung mass. Review of the MIP images confirms the above findings CTA HEAD FINDINGS Anterior circulation: Intracranial internal carotid arteries are patent with minor calcified plaque. Anterior and middle cerebral arteries are patent. Posterior circulation: Intracranial vertebral arteries, basilar artery, and posterior cerebral arteries are patent. Venous sinuses: As permitted by contrast timing, patent.  Review of the MIP images confirms the above  findings CT Brain Perfusion Findings: CBF (<30%) Volume: 62m Perfusion (Tmax>6.0s) volume: 075mMismatch Volume: 20m19mnfarction Location:None IMPRESSION: No large vessel occlusion or hemodynamically significant stenosis. Perfusion imaging does not demonstrate evidence of core infarction or territory at risk. Plaque at the left greater than right ICA origins causing less than 50% stenosis. Plaque at the vertebral artery origins causing at least moderate stenosis. Electronically Signed   By: PraMacy MisD.   On: 02/19/2020 14:17   CT Angio Neck W and/or Wo Contrast  Result Date: 02/19/2020 CLINICAL DATA:  Left-sided facial droop EXAM: CT ANGIOGRAPHY HEAD AND NECK CT PERFUSION BRAIN TECHNIQUE: Multidetector CT imaging of the head and neck was performed using the standard protocol during bolus administration of intravenous contrast. Multiplanar CT image reconstructions and MIPs were obtained to evaluate the vascular anatomy. Carotid stenosis measurements (when applicable) are obtained utilizing NASCET criteria, using the distal internal carotid diameter as the denominator. Multiphase CT imaging of the brain was performed following IV bolus contrast injection. Subsequent parametric perfusion maps were calculated using RAPID software. CONTRAST:  1020m78mNIPAQUE IOHEXOL 350 MG/ML SOLN COMPARISON:  None. FINDINGS: CTA NECK FINDINGS Aortic arch: Great vessel origins are patent. Right carotid system: Patent. Mild calcified plaque along the common carotid. Primarily calcified plaque along the proximal internal carotid causing minimal stenosis. Left carotid system: Patent. Primarily calcified plaque along the proximal internal carotid causing less than 50% stenosis. Vertebral arteries: Patent. Left vertebral artery is dominant. There is plaque at the left vertebral artery origin and along the proximal V1 segment with moderate stenosis. Plaque at the right vertebral artery origin also likely causes at least moderate  stenosis. Skeleton: Cervical spine degenerative changes, primarily facet hypertrophy. Other neck: No mass or adenopathy. Upper chest: No apical lung mass. Review of the MIP images confirms the above findings CTA HEAD FINDINGS Anterior circulation: Intracranial internal carotid arteries are patent with minor calcified plaque. Anterior and middle cerebral arteries are patent. Posterior circulation: Intracranial vertebral arteries, basilar artery, and posterior cerebral arteries are patent. Venous sinuses: As permitted by contrast timing, patent. Review of the MIP images confirms the above findings CT Brain Perfusion Findings: CBF (<30%) Volume: 20mL 8mfusion (Tmax>6.0s) volume: 20mL M77match Volume: 20mL In120mction Location:None IMPRESSION: No large vessel occlusion or hemodynamically significant stenosis. Perfusion imaging does not demonstrate evidence of core infarction or territory at risk. Plaque at the left greater than right ICA origins causing less than 50% stenosis. Plaque at the vertebral artery origins causing at least moderate stenosis. Electronically Signed   By: PraneilMacy Mis On: 02/19/2020 14:17   CT CEREBRAL PERFUSION W CONTRAST  Result Date: 02/19/2020 CLINICAL DATA:  Left-sided facial droop EXAM: CT ANGIOGRAPHY HEAD AND NECK CT PERFUSION BRAIN TECHNIQUE: Multidetector CT imaging of the head and neck was performed using the standard protocol during bolus administration of intravenous contrast. Multiplanar CT image reconstructions and MIPs were obtained to evaluate the vascular anatomy. Carotid stenosis measurements (when applicable) are obtained utilizing NASCET criteria, using the distal internal carotid diameter as the denominator. Multiphase CT imaging of the brain was performed following IV bolus contrast injection. Subsequent parametric perfusion maps were calculated using RAPID software. CONTRAST:  1020mL OM65mQUE IOHEXOL 350 MG/ML SOLN COMPARISON:  None. FINDINGS: CTA NECK FINDINGS  Aortic arch: Great vessel origins are patent. Right carotid system: Patent. Mild calcified plaque along the common carotid. Primarily calcified plaque along the proximal internal carotid causing minimal stenosis. Left carotid  system: Patent. Primarily calcified plaque along the proximal internal carotid causing less than 50% stenosis. Vertebral arteries: Patent. Left vertebral artery is dominant. There is plaque at the left vertebral artery origin and along the proximal V1 segment with moderate stenosis. Plaque at the right vertebral artery origin also likely causes at least moderate stenosis. Skeleton: Cervical spine degenerative changes, primarily facet hypertrophy. Other neck: No mass or adenopathy. Upper chest: No apical lung mass. Review of the MIP images confirms the above findings CTA HEAD FINDINGS Anterior circulation: Intracranial internal carotid arteries are patent with minor calcified plaque. Anterior and middle cerebral arteries are patent. Posterior circulation: Intracranial vertebral arteries, basilar artery, and posterior cerebral arteries are patent. Venous sinuses: As permitted by contrast timing, patent. Review of the MIP images confirms the above findings CT Brain Perfusion Findings: CBF (<30%) Volume: 46m Perfusion (Tmax>6.0s) volume: 039mMismatch Volume: 82m64mnfarction Location:None IMPRESSION: No large vessel occlusion or hemodynamically significant stenosis. Perfusion imaging does not demonstrate evidence of core infarction or territory at risk. Plaque at the left greater than right ICA origins causing less than 50% stenosis. Plaque at the vertebral artery origins causing at least moderate stenosis. Electronically Signed   By: PraMacy MisD.   On: 02/19/2020 14:17   DG Chest Portable 1 View  Result Date: 02/19/2020 CLINICAL DATA:  Weakness EXAM: PORTABLE CHEST 1 VIEW COMPARISON:  04/06/2019 FINDINGS: Prior CABG. Stable heart size. No focal airspace consolidation, pleural effusion, or  pneumothorax. IMPRESSION: No active disease. Electronically Signed   By: NicDavina PokeO.   On: 02/19/2020 15:07   CT HEAD CODE STROKE WO CONTRAST  Result Date: 02/19/2020 CLINICAL DATA:  Code stroke. EXAM: CT HEAD WITHOUT CONTRAST TECHNIQUE: Contiguous axial images were obtained from the base of the skull through the vertex without intravenous contrast. COMPARISON:  2018 FINDINGS: Brain: There is no acute intracranial hemorrhage, mass effect, or edema. Gray-white differentiation remains preserved. Ventricles and sulci are within normal limits in size and configuration. Minimal patchy hypoattenuation in the supratentorial white matter is nonspecific but may reflect minor chronic microvascular ischemic changes. There is no extra-axial fluid collection. Vascular: No hyperdense vessel. There is intracranial atherosclerotic calcification at the skull base. Skull: Unremarkable. Sinuses/Orbits: No acute finding. Other: Mastoid air cells are clear. ASPECTS ([facility] Early CT Score) - Ganglionic level infarction (caudate, lentiform nuclei, internal capsule, insula, M1-M3 cortex): 7 - Supraganglionic infarction (M4-M6 cortex): 3 Total score (0-10 with 10 being normal): 10 IMPRESSION: No acute intracranial hemorrhage or evidence of acute infarction. ASPECT score is 10. These results were called by telephone at the time of interpretation on 02/19/2020 at 1:51 pm to provider NATDavonna Bellingwho verbally acknowledged these results. Electronically Signed   By: PraMacy MisD.   On: 02/19/2020 13:53     Assessment/Plan   ThoAKIEM URIETA a 63 30o. male with medical history significant for type 2 diabetes mellitus, chronic thrombocytopenia, hyperlipidemia, hypertension, chronic low back pain, who is admitted to [facility] 02/19/2020 with suspected acute ischemic CVA after [facility] from home [facility] complaining of left-sided facial droop.    Principal Problem:    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) ([facility]CC[facility]etersonctive Problems:   Mixed hyperlipidemia   Essential hypertension, benign   Type 2 diabetes mellitus (HCC)   Anxiety   GERD (gastroesophageal reflux disease)    #) Acute ischemic CVA: suspected dx on the basis of acute onset of left-sided facial droop upon  waking from overnight sleep at 1000 AM on 02/19/20, relative to no acute focal neurologic deficits at time that patient went to sleep around 2300 on 02/18/20, representing his last known normal.  Of note, noncontrast CT of the head showed no evidence of acute intracranial process, including no evidence of acute intracranial hemorrhage, while CTA of the head and neck showed no evidence of dissection, aneurysm, thrombus, or hemodynamically significant stenosis.  At the time that the patient presented to [facility] ED at 1324 on 02/19/20, he was while outside of the window for TPA administration given the above last known normal.  At this time, the patient has mild residual left facial droop in the absence of any additional acute focal neurologic deficits.   Evaluated in the ED by telestroke neurologist, Dr. Vickki Muff, who suspects acute ischemic CVA and confirmed that the patient was well outside of the window for TPA administration given the timing of his presentation to the ED today. Dr. Vickki Muff recommended admission to the hospital service for further evaluation and management of suspected acute ischemic CVA, including evaluation for potential modifiable CVA risk factors, including checking lipid panel, hemoglobin A1c, echocardiogram with bubble study, monitoring on telemetry overnight.  He also recommends checking MRI of the brain, and introducing course of dual antiplatelet therapy by adding Plavix to existing home baby aspirin, in the absence of currently taking any additional blood thinning agents at home.  The patient possesses multiple potentially modifiable risk factors including  type 2 diabetes mellitus, hypertension, hyperlipidemia, and OSA, with suboptimal compliance recommendation from nocturnal CPAP.  Lifelong non-smoker and no known history of atrial fibrillation.    Discussed risk factors for stroke that are specific to the patient for stroke risk reduction including HTN, DM2, HLD, and OSA, and the patient was counseled on importance of optimizing management of such.    Plan: Nursing bedside swallow evaluation x 1 now, and will not initiate oral medications or diet until the patient has passed this. Head of the bed at 30 degrees. Neuro checks per protocol. VS per protocol. Will allow for permissive hypertension for 24-48 hours following onset of acute focal neurologic deficits using last known normal of 2300 on 02/18/20 as reference point. During that period of permissive hypertension, will treat with antihypertensive medications unless systolic blood pressure is greater than 829 mmHg or diastolic blood pressure is greater than 120 mmHg. Monitor on telemetry, including monitoring for atrial fibrillation as modifiable risk factor for acute ischemic CVA. Check lipid panel in morning. Check hemoglobin A1c to assess for glycemic control in the setting of known type 2 diabetes mellitus. PT/OT consults have been ordered to occur in the morning. FD ASA x 1 now, with resumption of aspirin 81 mg p.o. daily tomorrow.  Per neurology recommendations, will Plavix to home baby aspirin for course of dual antiplatelet therapy, as further described above, with close attention to ensuing platelet count in the context of chronic thrombocytopenia. start high intensity statin in the form of atorvastatin 40 mg p.o. nightly, first dose now.  MRI of the brain has been ordered for the morning.  I have placed an order for echocardiogram with bubble study to occur in the morning in order to evaluate for intracardiac thrombus, septal aneurysm, or septal wall defect.  EKG x1 now.       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  Documented history of chronic versus recurrent pancytopenia. per presenting laboratory studies meld score calculated to be 8, conferring  an estimated 1.9% 14-monthmortality.  Plan: Repeat CMP and INR in the morning.  Monitor strict I's and O's and daily weights.     #) Type 2 diabetes mellitus: Outpatient insulin regimen consists of Tresiba 70 units subcu nightly as well as sliding scale NovoLog 3 times daily with meals.  Additionally, the patient is on Metformin as a sole home oral hypoglycemic agent.  Blood sugar per presenting CMP noted to be 269.  Will check hemoglobin A1c as means of assessing adequacy of outpatient glycemic management as potential modifiable CVA risk factor.  Patient's diabetes is complicated by peripheral polyneuropathy for which she is on gabapentin at home.  Plan: Check hemoglobin A1c, as above.  In terms of basal insulin during this hospitalization, will start with Lantus 35 units subcu nightly.  Accu-Cheks before every meal and at bedtime with associated moderate dose sliding scale insulin.  Hold Metformin during this hospitalization.  Continue home gabapentin.      #) Obstructive sleep apnea: Documented history of such, with documentation of suboptimal compliance with home nocturnal CPAP.  Plan: Counseled the patient on the importance of improved compliance with home nocturnal CPAP.     #) Hyperlipidemia: On Zetia as well as pravastatin at home.  In the context of presenting suspected acute ischemic CVA while on pravastatin, will escalate current statin therapy from pravastatin to high intensity atorvastatin 40 mg p.o. nightly, first dose now.  Plan: Continue home Zetia.  Stop pravastatin, and initiate atorvastatin 40 mg p.o. nightly, first dose now, as described above.  Check lipid panel in the morning as component of evaluation of potential modifiable CV risk factors, as further described above.     #)  Essential hypertension: Outpatient antihypertensive regimen includes amlodipine, Lasix, Imdur, Lopressor.  Presenting systolic blood pressures noted to be in the range of 120's - 140's mmHg. in the setting of suspected presenting acute ischemic CVA, will allow for 24 to 48 hours of permissive hypertension, starting from last known normal of 2300 on 02/18/2020, as further described in quantified above.  Plan: Allow for permissive hypertension in the time frame above, hold home antihypertensive medications.  Close monitoring of ensuing blood pressure via vital signs per acute ischemic stroke protocol.     #) Moderate persistent asthma: Outpatient respiratory regimen consists of scheduled Advair as well as as needed albuterol inhaler.  No evidence to suggest acute asthma exacerbation at this time.  Plan: Continue home scheduled Advair as well as as needed albuterol inhaler.  Add on serum magnesium level.     #) Migraines: On prophylactic topiramate at home.  No evidence of acute migraine at this time.  Plan: Continue on prophylactic topiramate.    DVT prophylaxis: SCDs Code Status: Full code Family Communication: none Disposition Plan: Per Rounding Team Consults called: Case/imaging were discussed with the on-call telestroke neurologist, Dr. FVickki Muff as further described above. Admission status: observation; med telemetry.    PLEASE NOTE THAT DRAGON DICTATION SOFTWARE WAS USED IN THE CONSTRUCTION OF THIS NOTE.   [facility] Pager [phone] 3PM- 11PM.   Otherwise, please contact night-coverage  [email or web address] Password [facility] 02/19/2020, 3:40 PM

## 2020-02-19 NOTE — ED Triage Notes (Signed)
Patient brought in by family member for evaluation of facial droop

## 2020-02-19 NOTE — [facility] Program (Signed)
[facility] TeleNeurology Consult Services   Date of Service:   02/19/2020 13:52:41  Impression:     .  I63.9 - Cerebrovascular accident (CVA), unspecified mechanism ([place])  Comments/Sign-Out: Mild left facial weakness and slowness of thought vs confusion per wife. NIHSS = 1 for trace facial weakness. Acute stroke suspected, though ddx includes recrudescence prior stroke sympotms due to metabolic/infectious process. Recommend MRI brain, add plavix 75 mg daily to ASA.  Metrics: Last Known Well: 02/18/2020 22:00:00 [facility] Notification Time: 02/19/2020 13:52:41 Arrival Time: 02/19/2020 13:24:00 Stamp Time: 02/19/2020 13:52:41 Time First Login Attempt: 02/19/2020 13:55:32 Symptoms: confused, left facial weakness NIHSS Start Assessment Time: 02/19/2020 14:04:01 Patient is not a candidate for Alteplase/Activase. Alteplase Medical Decision: 02/19/2020 14:11:51 Patient was not deemed candidate for Alteplase/Activase thrombolytics because of Last Well Known Above 4.5 Hours.  CT head showed no acute hemorrhage or acute core infarct. CT head was reviewed.  Lower Likelihood of Large Vessel Occlusion but Following Stat Studies are Recommended  CTA Head and Neck. CT Perfusion. Reviewed, No Indication of Large Vessel Occlusive Thrombus, Patient is not an NIR Candidate.   ED Physician notified of diagnostic impression and management plan on 02/19/2020 14:15:53  Our recommendations are outlined below.  Recommendations:     .  Activate Stroke Protocol Admission/Order Set     .  Stroke/Telemetry Floor     .  Neuro Checks     .  Bedside Swallow Eval     .  DVT Prophylaxis     .  IV Fluids, Normal Saline     .  Head of Bed 30 Degrees     .  Euglycemia and Avoid Hyperthermia (PRN Acetaminophen)     .  Initiate Plavix 75 MG Daily     .  Recommend MRI brain, add plavix 75 mg daily to ASA.     [name]  Continue medical workup.  Routine Consultation with [facility] for Follow up Care  Sign Out:     .  Discussed with Emergency Department Provider    ------------------------------------------------------------------------------  History of Present Illness: Patient is a 64 year old Male.  The patient had a prior stroke last fall and received tPA. His gait was slighlty worsened after that stroke per his wife. He has h/o CABG, vertigo, OSA, thrombocytopenia. He awoke this morning not normal. His wife states that his tone of voice is different for her, mild confusion present and she feels that his left face has a mild droop. He had more difficulty walking this morning, slower to her.   Past Medical History:     . Hypertension     . Hyperlipidemia     . Coronary Artery Disease     . Stroke     . There is NO history of Diabetes Mellitus     . There is NO history of Atrial Fibrillation  Anticoagulant use:  No  Antiplatelet use: asa 81 mg    Examination: BP(142/83), Pulse(68), Blood Glucose(278) 1A: Level of Consciousness - Alert; keenly responsive + 0 1B: Ask Month and Age - Both Questions Right + 0 1C: Blink Eyes & Squeeze Hands - Performs Both Tasks + 0 2: Test Horizontal Extraocular Movements - Normal + 0 3: Test Visual Fields - No Visual Loss + 0 4: Test Facial Palsy (Use Grimace if Obtunded) - Minor paralysis (flat nasolabial fold, smile asymmetry) + 1 5A: Test Left Arm Motor Drift - No Drift for 10 Seconds + 0 5B: Test Right Arm Motor  Drift - No Drift for 10 Seconds + 0 6A: Test Left Leg Motor Drift - No Drift for 5 Seconds + 0 6B: Test Right Leg Motor Drift - No Drift for 5 Seconds + 0 7: Test Limb Ataxia (FNF/Heel-Shin) - No Ataxia + 0 8: Test Sensation - Normal; No sensory loss + 0 9: Test Language/Aphasia - Normal; No aphasia + 0 10: Test Dysarthria - Normal + 0 11: Test Extinction/Inattention - No abnormality + 0  NIHSS Score: 1  Pre-Morbid Modified Ranking Scale: 1 Points = No significant disability despite  symptoms; able to carry out all usual duties and activities   Patient/Family was informed the Neurology Consult would occur via TeleHealth consult by way of interactive audio and video telecommunications and consented to receiving care in this manner.     Dr Jeralene Huff   [facility] [phone]  Case [id number]

## 2020-02-19 NOTE — ED Provider Notes (Signed)
[facility] EMERGENCY DEPARTMENT Provider Note   CSN: [id number] Arrival date & time: 02/19/20  1324     History No chief complaint on file.   [name] [name] is a 64 y.o. male. Level 5 caveat due to altered mental status. HPI Patient presents for facial droop dizziness and some confusion.  Reportedly was normal when he went to bed last night but woke up at around 11:00 today had facial droop some confusion generalized weakness.  Also states he have a headache.      Past Medical History:  Diagnosis Date  . Anxiety   . Arthritis   . Asthma   . Cataract    Right eye  . Chronic lower back pain   . Cirrhosis of liver ([place])   . Coronary atherosclerosis of native coronary artery    a. LAD & OM stenting;  b. 2007 Cath: nonobs dzs, patent stents;  c. 07/2012 neg MV, EF 61%.  d. 11/20/14: [place] s/p  PCI w/ DES to pLAD and DES to OM1   . Essential hypertension   . GERD (gastroesophageal reflux disease)   . Hearing loss of left ear   . History of gout   . History of hiatal hernia   . Hypercholesteremia   . Lumbar herniated disc   . MI, old   . Migraine   . OSA (obstructive sleep apnea)   . 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 Thrombocytopenia ([place])   . TIA (transient ischemic attack) ~ 2010  . Type 2 diabetes mellitus [facility])     Patient Active Problem List   Diagnosis Date Noted  . 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) ([place]) 02/19/2020  . Hepatic cirrhosis ([place]) 09/13/2019  . GERD (gastroesophageal reflux disease) 09/13/2019  . Dysphagia 09/13/2019  . DDD (degenerative disc disease), cervical 04/13/2019  . RUQ abdominal pain 11/28/2018  . Calculus of gallbladder with acute cholecystitis without obstruction   . Fatty liver   . Acute diastolic CHF (congestive heart failure) ([place]) 11/26/2018  . Right upper quadrant abdominal pain 11/26/2018  . Acute respiratory failure with  hypoxia ([place]) 11/26/2018  . NASH (nonalcoholic steatohepatitis)   . Pancytopenia, acquired ([place])   . Acute kidney injury ([place]) 11/20/2018  . Aphasia 07/09/2018  . Cerebral thrombosis with cerebral infarction 07/09/2018  . Chest pain 08/10/2017  . 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 Coronary artery disease involving native coronary artery with unstable angina pectoris ([place])   . CAD -S/P PCI LAD OM/LAD '13 and 2016 12/31/2015  . Hyperlipidemia   . Chest pain with high risk of acute coronary syndrome 12/23/2015  . Precordial chest pain 12/03/2014  . TIA (transient ischemic attack)   . CAD- diffuse LAD disease at cath 12/30/15   . Thrombocytopenia ([place])   . Fever 12/04/2013  . Accelerating angina ([place]) 12/03/2013  . Type 2 diabetes mellitus ([place])   . Anxiety   . PVC's (premature ventricular contractions) 01/06/2013  . Obstructive sleep apnea-declines C-pap 07/04/2012  . Mixed hyperlipidemia 07/30/2009  . Essential hypertension, benign 07/30/2009    Past Surgical History:  Procedure Laterality Date  . ANTERIOR CERVICAL DECOMP/DISCECTOMY FUSION  1998   "C3-4"  . CARDIAC CATHETERIZATION  "several"  . CARDIAC CATHETERIZATION N/A 12/30/2015   Procedure: Left Heart Cath and Coronary Angiography;  Surgeon: [name], MD;  Location: [facility] LAB;  Service:  Cardiovascular;  Laterality: N/A;  . CARPAL TUNNEL RELEASE Bilateral 2005  . CHOLECYSTECTOMY N/A 11/28/2018   Procedure: LAPAROSCOPIC CHOLECYSTECTOMY;  Surgeon: [name], MD;  Location: [facility];  Service: General;  Laterality: N/A;  . COLONOSCOPY    . CORONARY ANGIOPLASTY    . CORONARY ANGIOPLASTY WITH STENT PLACEMENT  2002; 2003; 11/20/2014   "I have 4 stents after today" (11/20/2014)  . CORONARY ARTERY BYPASS GRAFT N/A 01/09/2016   Procedure: CORONARY ARTERY BYPASS GRAFTING (CABG) X 3 UTILIZING RIGHT AND LEFT INTERNAL MAMMARY ARTERY AND ENDOSCOPICALLY HARVESTED SAPHENEOUS VEIN.;  Surgeon: Rexene Alberts, MD;  Location: [facility];   Service: Open Heart Surgery;  Laterality: N/A;  . ESOPHAGOGASTRODUODENOSCOPY    . KNEE SURGERY Left 02/2012   "scraped; open"  . LEFT HEART CATH AND CORS/GRAFTS ANGIOGRAPHY N/A 08/09/2017   Procedure: LEFT HEART CATH AND CORS/GRAFTS ANGIOGRAPHY;  Surgeon: [name], MD;  Location: [place] [facility] LAB;  Service: Cardiovascular;  Laterality: N/A;  . LEFT HEART CATH AND CORS/GRAFTS ANGIOGRAPHY N/A 11/22/2018   Procedure: LEFT HEART CATH AND CORS/GRAFTS ANGIOGRAPHY;  Surgeon: [name], [name], MD;  Location: [place] [facility];  Service: Cardiovascular;  Laterality: N/A;  . LEFT HEART CATHETERIZATION WITH CORONARY ANGIOGRAM N/A 07/20/2012   Procedure: LEFT HEART CATHETERIZATION WITH CORONARY ANGIOGRAM;  Surgeon: [name], MD;  Location: [place] CATH LAB;  Service: Cardiovascular;  Laterality: N/A;  . LEFT HEART CATHETERIZATION WITH CORONARY ANGIOGRAM N/A 11/20/2014   Procedure: LEFT HEART CATHETERIZATION WITH CORONARY ANGIOGRAM;  Surgeon: [name], MD;  Location: [facility] CATH LAB;  Service: Cardiovascular;  Laterality: N/A;  . LEFT HEART CATHETERIZATION WITH CORONARY ANGIOGRAM N/A 11/26/2014   Procedure: LEFT HEART CATHETERIZATION WITH CORONARY ANGIOGRAM;  Surgeon: [name], MD;  Location: [facility] CATH LAB;  Service: Cardiovascular;  Laterality: N/A;  . NEUROPLASTY / TRANSPOSITION ULNAR NERVE AT ELBOW Right ~ 2012  . PERCUTANEOUS CORONARY ROTOBLATOR INTERVENTION (PCI-R)  11/20/2014   Procedure: PERCUTANEOUS CORONARY ROTOBLATOR INTERVENTION (PCI-R);  Surgeon: [name], MD;  Location: [facility] CATH LAB;  Service: Cardiovascular;;  . SHOULDER ARTHROSCOPY Left ~ 2011  . TEE WITHOUT CARDIOVERSION N/A 01/09/2016   Procedure: TRANSESOPHAGEAL ECHOCARDIOGRAM (TEE);  Surgeon: Rexene Alberts, MD;  Location: [facility];  Service: Open Heart Surgery;  Laterality: N/A;       Family History  Problem Relation Age of Onset  . Stroke Mother   . Coronary artery disease Father 24  . Asthma Sister   . Multiple  sclerosis Brother   . Heart disease Paternal Uncle   . Stroke Maternal Grandmother   . Heart attack Paternal Grandmother   . Cancer Paternal Grandfather   . Asthma Sister   . Seizures Son   . Migraines Son   . Autism Son   . Migraines Son     Social History   Tobacco Use  . Smoking status: Never Smoker  . Smokeless tobacco: Never Used  Substance Use Topics  . Alcohol use: No  . Drug use: No    Home Medications Prior to Admission medications   Medication Sig Start Date End Date Taking? Authorizing Provider  albuterol (PROVENTIL HFA;VENTOLIN HFA) 108 (90 BASE) MCG/ACT inhaler Inhale 2 puffs into the lungs every 6 (six) hours as needed for wheezing or shortness of breath.   Yes [provider]  amLODipine (NORVASC) 5 MG tablet Take 5 mg by mouth at bedtime. 06/16/18  Yes [provider]  aspirin EC 81 MG tablet Take 81 mg  by mouth at bedtime.    Yes [provider]  citalopram (CELEXA) 20 MG tablet Take 20 mg by mouth at bedtime.    Yes [provider]  diazepam (VALIUM) 5 MG tablet Take 5 mg by mouth at bedtime. *May take one tablet up to three times daily as needed for anxiety   Yes [provider]  ergocalciferol (VITAMIN D2) 1.25 MG (50000 UT) capsule Take 1 capsule (50,000 Units total) by mouth once a week. 05/03/19  Yes Lockamy, Randi L, NP-C  ezetimibe (ZETIA) 10 MG tablet Take 10 mg by mouth daily.   Yes [provider]  ferrous sulfate 325 (65 FE) MG EC tablet Take 325 mg by mouth once a week.    Yes [provider]  fluticasone (FLONASE) 50 MCG/ACT nasal spray Place 1 spray into the nose 2 (two) times daily.    Yes [provider]  Fluticasone-Salmeterol (ADVAIR) 250-50 MCG/DOSE AEPB Inhale 1 puff into the lungs every 12 (twelve) hours.   Yes [provider]  furosemide (LASIX) 20 MG tablet Take 20 mg by mouth daily. 10/14/19  Yes [provider]  gabapentin (NEURONTIN) 300 MG capsule Take  300 mg by mouth 3 (three) times daily. 07/09/17  Yes [provider]  insulin aspart (NOVOLOG FLEXPEN) 100 UNIT/ML FlexPen Inject 2-30 Units into the skin 3 (three) times daily as needed for high blood sugar (CBG >150). Per sliding scale written down at home    Yes [provider]  isosorbide mononitrate (IMDUR) 30 MG 24 hr tablet Take 30 mg by mouth at bedtime. 06/16/18  Yes [provider]  Melatonin 5 MG TABS Take 10 mg by mouth at bedtime.    Yes [provider]  metFORMIN (GLUCOPHAGE) 1000 MG tablet Take 1 tablet (1,000 mg total) by mouth 2 (two) times daily. For the next 2 days as you did receive IV contrast, then resume on 11/26/2018 Patient taking differently: Take 1,000 mg by mouth 2 (two) times daily.  11/23/18  Yes Elgergawy, Silver Huguenin, MD  metoprolol tartrate (LOPRESSOR) 50 MG tablet Take 50 mg by mouth 2 (two) times daily.    Yes [provider]  nitroGLYCERIN (NITROSTAT) 0.4 MG SL tablet Place 1 tablet (0.4 mg total) under the tongue every 5 (five) minutes x 3 doses as needed for chest pain (if no relief after 3rd dose, proceed to the ED for an evaluation or call 911). 04/10/19 02/19/20 Yes [name], MD  omeprazole (PRILOSEC) 40 MG capsule Take 40 mg by mouth daily.    Yes [provider]  tiZANidine (ZANAFLEX) 4 MG tablet Take 4 mg by mouth every 6 (six) hours as needed for muscle spasms.  02/12/20  Yes [provider]  topiramate (TOPAMAX) 25 MG capsule Take 50 mg by mouth 2 (two) times daily.    Yes [provider]  TRESIBA FLEXTOUCH 100 UNIT/ML SOPN FlexTouch Pen Inject 70 Units into the skin at bedtime.  08/11/18  Yes [provider]  clopidogrel (PLAVIX) 75 MG tablet Take 1 tablet (75 mg total) by mouth daily for 14 days. 02/21/20 03/06/20  Manuella Ghazi, Pratik D, DO  pravastatin (PRAVACHOL) 20 MG tablet Take 1 tablet (20 mg total) by mouth daily. 02/20/20 03/21/20  Manuella Ghazi, Pratik D, DO  vitamin B-12 (CYANOCOBALAMIN)  1000 MCG tablet Take 1 tablet (1,000 mcg total) by mouth daily. 02/20/20 03/21/20  Manuella Ghazi, Pratik D, DO    Allergies    Divalproex sodium, Statins, Tramadol, Valproic  acid, Gadolinium derivatives, and Tricor [fenofibrate]  Review of Systems   Review of Systems  Unable to perform ROS: Mental status change  Neurological: Positive for headaches.    Physical Exam Updated Vital Signs BP 125/87   Pulse 70   Temp 98.7 F (37.1 C) (Oral)   Resp 18   Ht 5' 9"  (1.753 m)   Wt 107.1 kg   SpO2 98%   BMI 34.87 kg/m   Physical Exam Vitals and nursing note reviewed.  HENT:     Head: Atraumatic.  Eyes:     Extraocular Movements: Extraocular movements intact.     Pupils: Pupils are equal, round, and reactive to light.  Cardiovascular:     Rate and Rhythm: Regular rhythm.  Pulmonary:     Breath sounds: No wheezing or rhonchi.  Abdominal:     Tenderness: There is no abdominal tenderness.  Musculoskeletal:        General: No tenderness.  Skin:    Capillary Refill: Capillary refill takes less than 2 seconds.  Neurological:     Mental Status: He is alert.     Comments: Patient is awake.  Some mild confusion.  Will answer most questions however.  Good finger nose bilaterally.  Will raise bilateral lower legs also.  Eye movements intact.  Does have some mild nystagmus.  Mild left-sided facial droop     ED Results / Procedures / Treatments   Labs (all labs ordered are listed, but only abnormal results are displayed) Labs Reviewed  CBC - Abnormal; Notable for the following components:      Result Value   WBC 1.9 (*)    Platelets 73 (*)    All other components within normal limits  DIFFERENTIAL - Abnormal; Notable for the following components:   Neutro Abs 1.0 (*)    All other components within normal limits  COMPREHENSIVE METABOLIC PANEL - Abnormal; Notable for the following components:   CO2 21 (*)    Glucose, Bld 269 (*)    Calcium 8.5 (*)    Albumin 3.3 (*)    All other  components within normal limits  COMPREHENSIVE METABOLIC PANEL - Abnormal; Notable for the following components:   Glucose, Bld 121 (*)    Albumin 3.4 (*)    All other components within normal limits  PROTIME-INR - Abnormal; Notable for the following components:   Prothrombin Time 15.3 (*)    All other components within normal limits  CBC WITH DIFFERENTIAL/PLATELET - Abnormal; Notable for the following components:   WBC 2.6 (*)    Platelets 71 (*)    Neutro Abs 1.3 (*)    All other components within normal limits  HEMOGLOBIN A1C - Abnormal; Notable for the following components:   Hgb A1c MFr Bld 8.1 (*)    All other components within normal limits  LIPID PANEL - Abnormal; Notable for the following components:   Triglycerides 191 (*)    HDL 26 (*)    All other components within normal limits  BLOOD GAS, VENOUS - Abnormal; Notable for the following components:   pO2, Ven 55.0 (*)    All other components within normal limits  GLUCOSE, CAPILLARY - Abnormal; Notable for the following components:   Glucose-Capillary 242 (*)    All other components within normal limits  GLUCOSE, CAPILLARY - Abnormal; Notable for the following components:   Glucose-Capillary 133 (*)    All other components within normal limits  GLUCOSE, CAPILLARY - Abnormal; Notable for the following components:  Glucose-Capillary 156 (*)    All other components within normal limits  CBG MONITORING, ED - Abnormal; Notable for the following components:   Glucose-Capillary 278 (*)    All other components within normal limits  CBG MONITORING, ED - Abnormal; Notable for the following components:   Glucose-Capillary 269 (*)    All other components within normal limits  SARS CORONAVIRUS 2 (TAT 6-24 HRS)  ETHANOL  PROTIME-INR  APTT  HIV ANTIBODY (ROUTINE TESTING W REFLEX)  MAGNESIUM  TSH    EKG EKG Interpretation  Date/Time:  Monday February 19 2020 14:48:39 EDT Ventricular Rate:  64 PR Interval:    QRS  Duration: 91 QT Interval:  428 QTC Calculation: 442 R Axis:   -6 Text Interpretation: Sinus rhythm Borderline T wave abnormalities No STEMI Confirmed by Nanda Quinton [phone]) on 02/20/2020 5:25:27 PM   Radiology MR BRAIN WO CONTRAST  Result Date: 02/20/2020 CLINICAL DATA:  Suspected acute ischemic CVA (presenting acute left facial droop); stroke, follow-up. EXAM: MRI HEAD WITHOUT CONTRAST TECHNIQUE: Multiplanar, multiecho pulse sequences of the brain and surrounding structures were obtained without intravenous contrast. COMPARISON:  Non-contrast CT head, CT angiogram head/neck and CT perfusion 02/19/2020, brain MRI 07/25/2019 FINDINGS: Brain: Mild intermittent motion degradation. There is no evidence of acute infarct. No evidence of intracranial mass. No midline shift or extra-axial fluid collection. No chronic intracranial blood products. Mild scattered T2/FLAIR hyperintensity within the cerebral white matter is similar to prior MRI 07/25/2019. Findings are nonspecific but consistent with chronic small vessel ischemic disease. Stable, mild generalized parenchymal atrophy. Vascular: Flow voids maintained within the proximal large arterial vessels. Skull and upper cervical spine: No focal marrow lesion. Sinuses/Orbits: Visualized orbits demonstrate no acute abnormality. Mild paranasal sinus mucosal thickening greatest within the bilateral ethmoid and maxillary sinuses. Left mastoid effusion. IMPRESSION: No evidence of acute intracranial abnormality, including acute infarction. Stable, mild generalized parenchymal atrophy and chronic small vessel ischemic disease. Mild paranasal sinus mucosal thickening. Left mastoid effusion. Electronically Signed   By: Kellie Simmering DO   On: 02/20/2020 11:26   ECHOCARDIOGRAM COMPLETE BUBBLE STUDY  Result Date: 02/20/2020    ECHOCARDIOGRAM REPORT   Patient Name:   [name] [name] Date of Exam: 02/20/2020 Medical Rec #:  [id number]       Height:       69.0 in Accession #:     [id number]      Weight:       236.1 lb Date of Birth:  19-Jan-1956       BSA:          2.217 m Patient Age:    89 years        BP:           131/77 mmHg Patient Gender: M               HR:           69 bpm. Exam Location:  [facility] Procedure: 2D Echo Indications:    Stroke 434.91 / I63.9  History:        Patient has prior history of Echocardiogram examinations, most                 recent 11/21/2018. CHF, CAD, Prior CABG, TIA,                 Signs/Symptoms:Chest Pain; Risk Factors:Hypertension, Diabetes,                 Dyslipidemia and Non-Smoker.  Sonographer:  Leavy Cella RDCS (AE) Referring Phys: [id number] [place]  1. Left ventricular ejection fraction, by estimation, is 55 to 60%. The left ventricle has normal function. The left ventricle has no regional wall motion abnormalities. There is mild left ventricular hypertrophy. Left ventricular diastolic parameters were normal.  2. Right ventricular systolic function is normal. The right ventricular size is normal. There is mildly elevated pulmonary artery systolic pressure. The estimated right ventricular systolic pressure is 45.4 mmHg.  3. No right to left interatrial shunting noted with saline contrast.  4. The mitral valve is grossly normal. Trivial mitral valve regurgitation.  5. The aortic valve is tricuspid. Aortic valve regurgitation is mild. Mild aortic valve sclerosis is present, with no evidence of aortic valve stenosis.  6. The inferior vena cava is dilated in size with >50% respiratory variability, suggesting right atrial pressure of 8 mmHg. FINDINGS  Left Ventricle: Left ventricular ejection fraction, by estimation, is 55 to 60%. The left ventricle has normal function. The left ventricle has no regional wall motion abnormalities. The left ventricular internal cavity size was normal in size. There is  mild left ventricular hypertrophy. Left ventricular diastolic parameters were normal. Right Ventricle: The right ventricular  size is normal. No increase in right ventricular wall thickness. Right ventricular systolic function is normal. There is mildly elevated pulmonary artery systolic pressure. The tricuspid regurgitant velocity is 2.43  m/s, and with an assumed right atrial pressure of 8 mmHg, the estimated right ventricular systolic pressure is 09.8 mmHg. Left Atrium: Left atrial size was normal in size. Right Atrium: Right atrial size was normal in size. Pericardium: There is no evidence of pericardial effusion. Mitral Valve: The mitral valve is grossly normal. Mild mitral annular calcification. Trivial mitral valve regurgitation. Tricuspid Valve: The tricuspid valve is grossly normal. Tricuspid valve regurgitation is trivial. Aortic Valve: The aortic valve is tricuspid. Aortic valve regurgitation is mild. Aortic regurgitation PHT measures 460 msec. Mild aortic valve sclerosis is present, with no evidence of aortic valve stenosis. Mild to moderate aortic valve annular calcification. Pulmonic Valve: The pulmonic valve was grossly normal. Pulmonic valve regurgitation is trivial. Aorta: The aortic root is normal in size and structure. Venous: The inferior vena cava is dilated in size with greater than 50% respiratory variability, suggesting right atrial pressure of 8 mmHg. IAS/Shunts: No atrial level shunt detected by color flow Doppler. Agitated saline contrast was given intravenously to evaluate for intracardiac shunting.  LEFT VENTRICLE PLAX 2D LVIDd:         3.85 cm  Diastology LVIDs:         2.06 cm  LV e' lateral:   10.20 cm/s LV PW:         1.02 cm  LV E/e' lateral: 6.3 LV IVS:        1.16 cm  LV e' medial:    5.98 cm/s LVOT diam:     2.10 cm  LV E/e' medial:  10.8 LV SV:         67 LV SV Index:   30 LVOT Area:     3.46 cm  RIGHT VENTRICLE RV S prime:     8.05 cm/s TAPSE (M-mode): 1.0 cm LEFT ATRIUM           Index LA diam:      3.30 cm 1.49 cm/m LA Vol (A2C): 38.3 ml 17.28 ml/m LA Vol (A4C): 38.4 ml 17.32 ml/m  AORTIC VALVE  LVOT Vmax:   76.40 cm/s LVOT Vmean:  54.500  cm/s LVOT VTI:    0.193 m AI PHT:      460 msec  AORTA Ao Root diam: 3.10 cm MITRAL VALVE               TRICUSPID VALVE MV Area (PHT): 2.48 cm    TR Peak grad:   23.6 mmHg MV Decel Time: 306 msec    TR Vmax:        243.00 cm/s MV E velocity: 64.70 cm/s MV A velocity: 56.10 cm/s  SHUNTS MV E/A ratio:  1.15        Systemic VTI:  0.19 m                            Systemic Diam: 2.10 cm Rozann Lesches MD Electronically signed by Rozann Lesches MD Signature Date/Time: 02/20/2020/10:02:25 AM    Final     Procedures Procedures (including critical care time)  Medications Ordered in ED Medications  iohexol (OMNIPAQUE) 350 MG/ML injection 100 mL (100 mLs Intravenous Contrast Given 02/19/20 1349)   stroke: mapping our early stages of recovery book ( Does not apply Given 02/19/20 1822)  atorvastatin (LIPITOR) tablet 40 mg (40 mg Oral Given 02/19/20 2023)    ED Course  I have reviewed the triage vital signs and the nursing notes.  Pertinent labs & imaging results that were available during my care of the patient were reviewed by me and considered in my medical decision making (see chart for details).    MDM Rules/Calculators/A&P                      Patient came in with facial droop and mental status changes.  Dizziness/unsteadiness.  Code stroke called.  Woke with deficits at 11 but last normal wound bed last night.  CT CTA and CT perfusion reassuring.  Discussed with neurology.  Requests admission to the hospital and states will need MRI.  Also look for signs of infection.  Care will be turned over to Dr. Roderic Palau Final Clinical Impression(s) / ED Diagnoses Final diagnoses:  Facial droop  Altered mental status, unspecified altered mental status type    Rx / DC Orders ED Discharge Orders         Ordered    clopidogrel (PLAVIX) 75 MG tablet  Daily     02/20/20 1315    pravastatin (PRAVACHOL) 20 MG tablet  Daily    Note to Pharmacy: PATIENT NEED OFFICE  VISIT   02/20/20 1315    Increase activity slowly     02/20/20 1315    Diet - low sodium heart healthy     02/20/20 1315    vitamin B-12 (CYANOCOBALAMIN) 1000 MCG tablet  Daily     02/20/20 1317           Davonna Belling, MD 02/21/20 1553

## 2020-02-19 NOTE — Plan of Care (Signed)
  Problem: Education: Goal: Knowledge of disease or condition will improve Outcome: Progressing Goal: Knowledge of secondary prevention will improve Outcome: Progressing

## 2020-02-20 ENCOUNTER — Observation Stay (HOSPITAL_COMMUNITY): Payer: Medicare Other

## 2020-02-20 ENCOUNTER — Telehealth: Payer: Self-pay | Admitting: Cardiology

## 2020-02-20 ENCOUNTER — Observation Stay (HOSPITAL_BASED_OUTPATIENT_CLINIC_OR_DEPARTMENT_OTHER): Payer: Medicare Other

## 2020-02-20 ENCOUNTER — Telehealth: Payer: Self-pay | Admitting: Adult Health

## 2020-02-20 DIAGNOSIS — I351 Nonrheumatic aortic (valve) insufficiency: Secondary | ICD-10-CM | POA: Diagnosis not present

## 2020-02-20 DIAGNOSIS — R29818 Other symptoms and signs involving the nervous system: Secondary | ICD-10-CM | POA: Diagnosis not present

## 2020-02-20 DIAGNOSIS — I639 Cerebral infarction, unspecified: Secondary | ICD-10-CM | POA: Diagnosis not present

## 2020-02-20 LAB — COMPREHENSIVE METABOLIC PANEL
ALT: 40 U/L (ref 0–44)
AST: 38 U/L (ref 15–41)
Albumin: 3.4 g/dL — ABNORMAL LOW (ref 3.5–5.0)
Alkaline Phosphatase: 42 U/L (ref 38–126)
Anion gap: 9 (ref 5–15)
BUN: 14 mg/dL (ref 8–23)
CO2: 23 mmol/L (ref 22–32)
Calcium: 9 mg/dL (ref 8.9–10.3)
Chloride: 110 mmol/L (ref 98–111)
Creatinine, Ser: 0.97 mg/dL (ref 0.61–1.24)
GFR calc Af Amer: >60 mL/min (ref >60)
GFR calc non Af Amer: >60 mL/min (ref >60)
Glucose, Bld: 121 mg/dL — ABNORMAL HIGH (ref 70–99)
Potassium: 3.9 mmol/L (ref 3.5–5.1)
Sodium: 142 mmol/L (ref 135–145)
Total Bilirubin: 0.6 mg/dL (ref 0.3–1.2)
Total Protein: 6.8 g/dL (ref 6.5–8.1)

## 2020-02-20 LAB — BLOOD GAS, VENOUS
Acid-base deficit: 0.4 mmol/L (ref 0.0–2.0)
Bicarbonate: 23.4 mmol/L (ref 20.0–28.0)
Drawn by: 4252
FIO2: 21
O2 Saturation: 87 %
Patient temperature: 37
pCO2, Ven: 44.1 mmHg (ref 44.0–60.0)
pH, Ven: 7.36 (ref 7.250–7.430)
pO2, Ven: 55 mmHg — ABNORMAL HIGH (ref 32.0–45.0)

## 2020-02-20 LAB — CBC WITH DIFFERENTIAL/PLATELET
Abs Immature Granulocytes: 0.01 10*3/uL (ref 0.00–0.07)
Basophils Absolute: 0 10*3/uL (ref 0.0–0.1)
Basophils Relative: 0 %
Eosinophils Absolute: 0.1 10*3/uL (ref 0.0–0.5)
Eosinophils Relative: 2 %
HCT: 43.2 % (ref 39.0–52.0)
Hemoglobin: 14.7 g/dL (ref 13.0–17.0)
Immature Granulocytes: 0 %
Lymphocytes Relative: 39 %
Lymphs Abs: 1 10*3/uL (ref 0.7–4.0)
MCH: 31.5 pg (ref 26.0–34.0)
MCHC: 34 g/dL (ref 30.0–36.0)
MCV: 92.5 fL (ref 80.0–100.0)
Monocytes Absolute: 0.2 10*3/uL (ref 0.1–1.0)
Monocytes Relative: 8 %
Neutro Abs: 1.3 10*3/uL — ABNORMAL LOW (ref 1.7–7.7)
Neutrophils Relative %: 51 %
Platelets: 71 10*3/uL — ABNORMAL LOW (ref 150–400)
RBC: 4.67 MIL/uL (ref 4.22–5.81)
RDW: 13.5 % (ref 11.5–15.5)
WBC: 2.6 10*3/uL — ABNORMAL LOW (ref 4.0–10.5)
nRBC: 0 % (ref 0.0–0.2)

## 2020-02-20 LAB — LIPID PANEL
Cholesterol: 150 mg/dL (ref 0–200)
HDL: 26 mg/dL — ABNORMAL LOW (ref >40)
LDL Cholesterol: 86 mg/dL (ref 0–99)
Total CHOL/HDL Ratio: 5.8 RATIO
Triglycerides: 191 mg/dL — ABNORMAL HIGH (ref <150)
VLDL: 38 mg/dL (ref 0–40)

## 2020-02-20 LAB — GLUCOSE, CAPILLARY
Glucose-Capillary: 133 mg/dL — ABNORMAL HIGH (ref 70–99)
Glucose-Capillary: 156 mg/dL — ABNORMAL HIGH (ref 70–99)

## 2020-02-20 LAB — TSH: TSH: 2.203 u[IU]/mL (ref 0.350–4.500)

## 2020-02-20 LAB — HIV ANTIBODY (ROUTINE TESTING W REFLEX): HIV Screen 4th Generation wRfx: NONREACTIVE

## 2020-02-20 LAB — PROTIME-INR
INR: 1.2 (ref 0.8–1.2)
Prothrombin Time: 15.3 seconds — ABNORMAL HIGH (ref 11.4–15.2)

## 2020-02-20 LAB — SARS CORONAVIRUS 2 (TAT 6-24 HRS): SARS Coronavirus 2: NEGATIVE

## 2020-02-20 LAB — MAGNESIUM: Magnesium: 1.8 mg/dL (ref 1.7–2.4)

## 2020-02-20 MED ORDER — PRAVASTATIN SODIUM 20 MG PO TABS: 20.0000 mg | ORAL_TABLET | Freq: Every day | ORAL | 0 refills | Status: DC

## 2020-02-20 MED ORDER — VITAMIN B-12 1000 MCG PO TABS: 1000.0000 ug | ORAL_TABLET | Freq: Every day | ORAL | 0 refills | Status: AC

## 2020-02-20 MED ORDER — CLOPIDOGREL BISULFATE 75 MG PO TABS: 75.0000 mg | ORAL_TABLET | Freq: Every day | ORAL | 0 refills | Status: AC

## 2020-02-20 NOTE — Telephone Encounter (Signed)
Please give [name] [name] w/ [facility] home health a call concerning this pt-- he was d/c from the hospital today and she's wanting to know if Dr. Domenic Polite would sign the Home Health orders for the pt's physical therapy orders, pt's PCP is in [place] and they can't have an out of state provider sign those orders.   Please give Santiago Glad a call @ [phone]

## 2020-02-20 NOTE — Plan of Care (Signed)
  Problem: Acute Rehab PT Goals(only PT should resolve) Goal: Patient Will Transfer Sit To/From Stand Outcome: Progressing Flowsheets (Taken 02/20/2020 1125) Patient will transfer sit to/from stand: with modified independence Goal: Pt Will Transfer Bed To Chair/Chair To Bed Outcome: Progressing Flowsheets (Taken 02/20/2020 1125) Pt will Transfer Bed to Chair/Chair to Bed: with modified independence Goal: Pt Will Perform Standing Balance Or Pre-Gait Outcome: Progressing Flowsheets (Taken 02/20/2020 1125) Pt will perform standing balance or pre-gait: with Modified Independent Goal: Pt Will Ambulate Outcome: Progressing Flowsheets (Taken 02/20/2020 1125) Pt will Ambulate:  50 feet  with modified independence  with least restrictive assistive device  11:25 AM, 02/20/20 Mearl Latin PT, DPT Physical Therapist at [facility]

## 2020-02-20 NOTE — Plan of Care (Signed)
  Problem: Education: Goal: Knowledge of disease or condition will improve Outcome: Progressing Goal: Knowledge of secondary prevention will improve Outcome: Progressing Goal: Knowledge of patient specific risk factors addressed and post discharge goals established will improve Outcome: Progressing   Problem: Self-Care: Goal: Ability to participate in self-care as condition permits will improve Outcome: Progressing

## 2020-02-20 NOTE — TOC Transition Note (Signed)
Transition of Care [facility]) - CM/SW Discharge Note   Patient Details  Name: ELLERY MERONEY MRN: [id number] Date of Birth: Oct 21, 1956  [facility] CM/SW Contact:  Tria Noguera, Chauncey Reading, RN Phone Number: 02/20/2020, 1:41 PM   Clinical Narrative:   Patient discharging today. Recommended for [facility] and RW. Patient agreeable, has had [facility] most recently, would like them again. Would like RW delivered to room.     Final next level of care: [place] Barriers to Discharge: Barriers Resolved   Patient Goals and CMS Choice Patient states their goals for this hospitalization and ongoing recovery are:: return home CMS Medicare.gov Compare Post Acute Care list provided to:: Patient Choice offered to / list presented to : Patient   Discharge Plan and Services   Discharge Planning Services: CM Consult Post Acute Care Choice: Home Health            DME Agency: [facility])       HH Arranged: PT [facility] Agency: [place] Date [place]: 02/20/20 Time [place]: 0315 Representative spoke with at [place]: [place] Determinants of Health ([place]) Interventions     Readmission Risk Interventions No flowsheet data found.

## 2020-02-20 NOTE — Progress Notes (Signed)
*  PRELIMINARY RESULTS* Echocardiogram 2D Echocardiogram with BUBBLE STUDY has been performed.  [name] [name] 02/20/2020, 9:46 AM

## 2020-02-20 NOTE — Telephone Encounter (Signed)
Rep with a med assist wanted to check in if MD neurologist would be able to sign off on PT orders from [place] states patients PCP is out of state and they are unable to take signing orders from out of state provider.

## 2020-02-20 NOTE — Plan of Care (Signed)
  Problem: Education: Goal: Knowledge of disease or condition will improve 02/20/2020 0831 by Zachery Conch, RN Outcome: Progressing 02/19/2020 1855 by Zachery Conch, RN Outcome: Progressing Goal: Knowledge of secondary prevention will improve 02/20/2020 0831 by Zachery Conch, RN Outcome: Progressing 02/19/2020 1855 by Zachery Conch, RN Outcome: Progressing Goal: Knowledge of patient specific risk factors addressed and post discharge goals established will improve Outcome: Progressing   Problem: Self-Care: Goal: Ability to participate in self-care as condition permits will improve Outcome: Progressing

## 2020-02-20 NOTE — Discharge Summary (Signed)
Physician Discharge Summary  [name] XTG:[id number] DOB: [birth year] DOA: 02/19/2020  PCP: Terrial Rhodes, MD  Admit date: 02/19/2020  Discharge date: 02/20/2020  Admitted From:Home  Disposition:  Home  Recommendations for Outpatient Follow-up:  1. Follow up with PCP in 1-2 weeks 2. Follow-up with neurology Dr. [name] in 4 weeks 3. Remain on dual antiplatelet therapy with aspirin and Plavix for 2 weeks and then aspirin daily thereafter 4. Continue on usual home statin.  Patient is intolerant of higher statin doses or other statins.  Home Health: Yes with PT  Equipment/Devices: Rolling walker  Discharge Condition: Stable  CODE STATUS: Full  Diet recommendation: Heart Healthy/carb modified  Brief/Interim Summary: Per HPI: [name] [name] is a 64 y.o. male with medical history significant for type 2 diabetes mellitus, chronic thrombocytopenia, hyperlipidemia, hypertension, chronic low back pain, who is admitted to [facility] on 02/19/2020 with suspected acute ischemic CVA after presenting from home to [facility] Emergency Department complaining of left-sided facial droop.   The patient reports that he went to bed around 2300 on 02/18/2020, at which time he reports that he was not experiencing any acute focal neurologic deficits, representing his last known normal.  Around 10 AM on 02/19/2020, the patient reports that he awoke and was experiencing left-sided facial droop confirmed by his wife.  Otherwise he denies any associated additional acute focal neurologic deficits, including no acute focal weakness, paresthesias, numbness, dysphagia, dizziness, vertigo, nausea, vomiting, change in vision, blurry vision, diplopia, word or finding difficulties, or slurring of speech.  He does however report that he was experiencing a mild frontal lobe headache at the time that he awoke today, but that this has subsequently subsided   Per chart review it appears that the patient has a history of TIA  in 2010, although the presenting acute focal neurologic deficits at that time or not clear.  He acknowledges a history of hypertension, hyperlipidemia, diabetes, and obstructive sleep apnea but denies any history of known underlying atrial fibrillation.  He reports good compliance with his outpatient antihypertensive regimen as well as basal and short acting insulin as well as Metformin.  He also reports good compliance with his home Zetia and pravastatin.  He is on a daily baby aspirin, but is otherwise on no blood thinning agents at home.  Reports that he is a lifelong non-smoker.  He acknowledges a history of struct of sleep apnea, but conveys that he has not been compliant with recommendations for home nocturnal CPAP.  Denies any associated subjective fever, chills, rigors, or generalized myalgias. Denies any recent neck stiffness, rhinitis, rhinorrhea, sore throat, sob, cough, abdominal pain, diarrhea, or rash. No recent traveling or known COVID-19 exposures. Denies dysuria, gross hematuria, or change in urinary urgency/frequency.  Denies any recent chest pain, diaphoresis, or palpitations.  Subsequently, the patient was brought to [facility] emergency department in the early afternoon of 02/19/2020 for evaluation of persistent, but improving left-sided facial droop.  He was noted to arrive in the emergency department today 1324.   4/13: Patient has had brain MRI with no acute findings noted.  He continues to have some mild facial droop and slowness of response.  LDL noted to be 86 and TSH 2.203.  His hemoglobin A1c has not been performed and will need to be followed up outpatient.  2D echocardiogram with bubble study with no acute findings and LVEF 55-60%.  He has had teleneurology evaluation with recommendations to remain on dual antiplatelet treatment for 2 weeks and  then aspirin thereafter with close follow-up to neurology and to remain on statin as tolerated.  PT recommendations for home with home  health physical therapy with rolling walker noted.  No other acute events noted during the course of this admission.  He is stable for discharge.  Discharge Diagnoses:  Principal Problem:   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) ([place]) Active Problems:   Mixed hyperlipidemia   Essential hypertension, benign   Type 2 diabetes mellitus ([place])   Anxiety   GERD (gastroesophageal reflux disease)  Principal discharge diagnosis: TIA.  Discharge Instructions  Discharge Instructions    Diet - low sodium heart healthy   Complete by: As directed    Increase activity slowly   Complete by: As directed      Allergies as of 02/20/2020      Reactions   Divalproex Sodium Other (See Comments)   Causes anger   Statins Other (See Comments)   Muscle aches and cramps   Tramadol Other (See Comments)   Chest pain    Valproic Acid Other (See Comments)   "anger"   Gadolinium Derivatives Nausea And Vomiting   07/25/19 Pt vomited immediately after IV gad. Denies itching, dyspnea.  (Adverse, not allergic, reaction   Tricor [fenofibrate] Other (See Comments)   Leg cramps      Medication List    TAKE these medications   albuterol 108 (90 Base) MCG/ACT inhaler Commonly known as: VENTOLIN HFA Inhale 2 puffs into the lungs every 6 (six) hours as needed for wheezing or shortness of breath.   amLODipine 5 MG tablet Commonly known as: NORVASC Take 5 mg by mouth at bedtime.   aspirin EC 81 MG tablet Take 81 mg by mouth at bedtime.   citalopram 20 MG tablet Commonly known as: CELEXA Take 20 mg by mouth at bedtime.   clopidogrel 75 MG tablet Commonly known as: PLAVIX Take 1 tablet (75 mg total) by mouth daily for 14 days. Start taking on: February 21, 2020   diazepam 5 MG tablet Commonly known as: VALIUM Take 5 mg by mouth at bedtime. *May take one tablet up to three times daily as needed for anxiety   ergocalciferol 1.25 MG (50000 UT) capsule Commonly known as:  VITAMIN D2 Take 1 capsule (50,000 Units total) by mouth once a week.   ezetimibe 10 MG tablet Commonly known as: ZETIA Take 10 mg by mouth daily.   ferrous sulfate 325 (65 FE) MG EC tablet Take 325 mg by mouth once a week.   fluticasone 50 MCG/ACT nasal spray Commonly known as: FLONASE Place 1 spray into the nose 2 (two) times daily.   Fluticasone-Salmeterol 250-50 MCG/DOSE Aepb Commonly known as: ADVAIR Inhale 1 puff into the lungs every 12 (twelve) hours.   furosemide 20 MG tablet Commonly known as: LASIX Take 20 mg by mouth daily.   gabapentin 300 MG capsule Commonly known as: NEURONTIN Take 300 mg by mouth 3 (three) times daily.   isosorbide mononitrate 30 MG 24 hr tablet Commonly known as: IMDUR Take 30 mg by mouth at bedtime.   melatonin 5 MG Tabs Take 10 mg by mouth at bedtime.   metFORMIN 1000 MG tablet Commonly known as: GLUCOPHAGE Take 1 tablet (1,000 mg total) by mouth 2 (two) times daily. For the next 2 days as you did receive IV contrast, then resume on 11/26/2018 What changed: additional instructions   metoprolol tartrate 50 MG tablet Commonly known as: LOPRESSOR Take 50 mg by mouth  2 (two) times daily.   nitroGLYCERIN 0.4 MG SL tablet Commonly known as: NITROSTAT Place 1 tablet (0.4 mg total) under the tongue every 5 (five) minutes x 3 doses as needed for chest pain (if no relief after 3rd dose, proceed to the ED for an evaluation or call 911).   NovoLOG FlexPen 100 UNIT/ML FlexPen Generic drug: insulin aspart Inject 2-30 Units into the skin 3 (three) times daily as needed for high blood sugar (CBG >150). Per sliding scale written down at home   omeprazole 40 MG capsule Commonly known as: PRILOSEC Take 40 mg by mouth daily.   pravastatin 20 MG tablet Commonly known as: PRAVACHOL Take 1 tablet (20 mg total) by mouth daily.   tiZANidine 4 MG tablet Commonly known as: ZANAFLEX Take 4 mg by mouth every 6 (six) hours as needed for muscle spasms.    topiramate 25 MG capsule Commonly known as: TOPAMAX Take 50 mg by mouth 2 (two) times daily.   [name] FlexTouch 100 UNIT/ML FlexTouch Pen Generic drug: insulin degludec Inject 70 Units into the skin at bedtime.   vitamin B-12 1000 MCG tablet Commonly known as: CYANOCOBALAMIN Take 1 tablet (1,000 mcg total) by mouth daily. What changed:   medication strength  how much to take            Durable Medical Equipment  (From admission, onward)         Start     Ordered   02/20/20 1315  DME Walker  Once    Question Answer Comment  Walker: With 5 Inch Wheels   Patient needs a walker to treat with the following condition Weakness      02/20/20 1315         Follow-up Information    Terrial Rhodes, MD Follow up in 1 week(s).   Contact information: [place] [address] [phone]        Phillips Odor, MD Follow up in 4 week(s).   Specialty: Neurology Contact information: [address] [place] [address] [phone]          Allergies  Allergen Reactions  . Divalproex Sodium Other (See Comments)    Causes anger  . Statins Other (See Comments)    Muscle aches and cramps  . Tramadol Other (See Comments)    Chest pain   . Valproic Acid Other (See Comments)    "anger"  . Gadolinium Derivatives Nausea And Vomiting    07/25/19 Pt vomited immediately after IV gad. Denies itching, dyspnea.  (Adverse, not allergic, reaction  . Tricor [Fenofibrate] Other (See Comments)    Leg cramps    Consultations:  Teleneurology, and discussed with Dr. [name] of neurology on phone   Procedures/Studies: CT Angio Head W or Wo Contrast  Result Date: 02/19/2020 CLINICAL DATA:  Left-sided facial droop EXAM: CT ANGIOGRAPHY HEAD AND NECK CT PERFUSION BRAIN TECHNIQUE: Multidetector CT imaging of the head and neck was performed using the standard protocol during bolus administration of intravenous contrast. Multiplanar CT image reconstructions  and MIPs were obtained to evaluate the vascular anatomy. Carotid stenosis measurements (when applicable) are obtained utilizing NASCET criteria, using the distal internal carotid diameter as the denominator. Multiphase CT imaging of the brain was performed following IV bolus contrast injection. Subsequent parametric perfusion maps were calculated using RAPID software. CONTRAST:  111m OMNIPAQUE IOHEXOL 350 MG/ML SOLN COMPARISON:  None. FINDINGS: CTA NECK FINDINGS Aortic arch: Great vessel origins are patent. Right carotid system: Patent. Mild calcified plaque  along the common carotid. Primarily calcified plaque along the proximal internal carotid causing minimal stenosis. Left carotid system: Patent. Primarily calcified plaque along the proximal internal carotid causing less than 50% stenosis. Vertebral arteries: Patent. Left vertebral artery is dominant. There is plaque at the left vertebral artery origin and along the proximal V1 segment with moderate stenosis. Plaque at the right vertebral artery origin also likely causes at least moderate stenosis. Skeleton: Cervical spine degenerative changes, primarily facet hypertrophy. Other neck: No mass or adenopathy. Upper chest: No apical lung mass. Review of the MIP images confirms the above findings CTA HEAD FINDINGS Anterior circulation: Intracranial internal carotid arteries are patent with minor calcified plaque. Anterior and middle cerebral arteries are patent. Posterior circulation: Intracranial vertebral arteries, basilar artery, and posterior cerebral arteries are patent. Venous sinuses: As permitted by contrast timing, patent. Review of the MIP images confirms the above findings CT Brain Perfusion Findings: CBF (<30%) Volume: 71m Perfusion (Tmax>6.0s) volume: 052mMismatch Volume: 25m73mnfarction Location:None IMPRESSION: No large vessel occlusion or hemodynamically significant stenosis. Perfusion imaging does not demonstrate evidence of core infarction or  territory at risk. Plaque at the left greater than right ICA origins causing less than 50% stenosis. Plaque at the vertebral artery origins causing at least moderate stenosis. Electronically Signed   By: PraMacy MisD.   On: 02/19/2020 14:17   CT Angio Neck W and/or Wo Contrast  Result Date: 02/19/2020 CLINICAL DATA:  Left-sided facial droop EXAM: CT ANGIOGRAPHY HEAD AND NECK CT PERFUSION BRAIN TECHNIQUE: Multidetector CT imaging of the head and neck was performed using the standard protocol during bolus administration of intravenous contrast. Multiplanar CT image reconstructions and MIPs were obtained to evaluate the vascular anatomy. Carotid stenosis measurements (when applicable) are obtained utilizing NASCET criteria, using the distal internal carotid diameter as the denominator. Multiphase CT imaging of the brain was performed following IV bolus contrast injection. Subsequent parametric perfusion maps were calculated using RAPID software. CONTRAST:  1025m51mNIPAQUE IOHEXOL 350 MG/ML SOLN COMPARISON:  None. FINDINGS: CTA NECK FINDINGS Aortic arch: Great vessel origins are patent. Right carotid system: Patent. Mild calcified plaque along the common carotid. Primarily calcified plaque along the proximal internal carotid causing minimal stenosis. Left carotid system: Patent. Primarily calcified plaque along the proximal internal carotid causing less than 50% stenosis. Vertebral arteries: Patent. Left vertebral artery is dominant. There is plaque at the left vertebral artery origin and along the proximal V1 segment with moderate stenosis. Plaque at the right vertebral artery origin also likely causes at least moderate stenosis. Skeleton: Cervical spine degenerative changes, primarily facet hypertrophy. Other neck: No mass or adenopathy. Upper chest: No apical lung mass. Review of the MIP images confirms the above findings CTA HEAD FINDINGS Anterior circulation: Intracranial internal carotid arteries are  patent with minor calcified plaque. Anterior and middle cerebral arteries are patent. Posterior circulation: Intracranial vertebral arteries, basilar artery, and posterior cerebral arteries are patent. Venous sinuses: As permitted by contrast timing, patent. Review of the MIP images confirms the above findings CT Brain Perfusion Findings: CBF (<30%) Volume: 25mL 82mfusion (Tmax>6.0s) volume: 25mL M78match Volume: 25mL In71mction Location:None IMPRESSION: No large vessel occlusion or hemodynamically significant stenosis. Perfusion imaging does not demonstrate evidence of core infarction or territory at risk. Plaque at the left greater than right ICA origins causing less than 50% stenosis. Plaque at the vertebral artery origins causing at least moderate stenosis. Electronically Signed   By: PraneilMacy Mis On: 02/19/2020 14:17   MR BRAIN  WO CONTRAST  Result Date: 02/20/2020 CLINICAL DATA:  Suspected acute ischemic CVA (presenting acute left facial droop); stroke, follow-up. EXAM: MRI HEAD WITHOUT CONTRAST TECHNIQUE: Multiplanar, multiecho pulse sequences of the brain and surrounding structures were obtained without intravenous contrast. COMPARISON:  Non-contrast CT head, CT angiogram head/neck and CT perfusion 02/19/2020, brain MRI 07/25/2019 FINDINGS: Brain: Mild intermittent motion degradation. There is no evidence of acute infarct. No evidence of intracranial mass. No midline shift or extra-axial fluid collection. No chronic intracranial blood products. Mild scattered T2/FLAIR hyperintensity within the cerebral white matter is similar to prior MRI 07/25/2019. Findings are nonspecific but consistent with chronic small vessel ischemic disease. Stable, mild generalized parenchymal atrophy. Vascular: Flow voids maintained within the proximal large arterial vessels. Skull and upper cervical spine: No focal marrow lesion. Sinuses/Orbits: Visualized orbits demonstrate no acute abnormality. Mild paranasal sinus  mucosal thickening greatest within the bilateral ethmoid and maxillary sinuses. Left mastoid effusion. IMPRESSION: No evidence of acute intracranial abnormality, including acute infarction. Stable, mild generalized parenchymal atrophy and chronic small vessel ischemic disease. Mild paranasal sinus mucosal thickening. Left mastoid effusion. Electronically Signed   By: Kellie Simmering DO   On: 02/20/2020 11:26   CT CEREBRAL PERFUSION W CONTRAST  Result Date: 02/19/2020 CLINICAL DATA:  Left-sided facial droop EXAM: CT ANGIOGRAPHY HEAD AND NECK CT PERFUSION BRAIN TECHNIQUE: Multidetector CT imaging of the head and neck was performed using the standard protocol during bolus administration of intravenous contrast. Multiplanar CT image reconstructions and MIPs were obtained to evaluate the vascular anatomy. Carotid stenosis measurements (when applicable) are obtained utilizing NASCET criteria, using the distal internal carotid diameter as the denominator. Multiphase CT imaging of the brain was performed following IV bolus contrast injection. Subsequent parametric perfusion maps were calculated using RAPID software. CONTRAST:  154m OMNIPAQUE IOHEXOL 350 MG/ML SOLN COMPARISON:  None. FINDINGS: CTA NECK FINDINGS Aortic arch: Great vessel origins are patent. Right carotid system: Patent. Mild calcified plaque along the common carotid. Primarily calcified plaque along the proximal internal carotid causing minimal stenosis. Left carotid system: Patent. Primarily calcified plaque along the proximal internal carotid causing less than 50% stenosis. Vertebral arteries: Patent. Left vertebral artery is dominant. There is plaque at the left vertebral artery origin and along the proximal V1 segment with moderate stenosis. Plaque at the right vertebral artery origin also likely causes at least moderate stenosis. Skeleton: Cervical spine degenerative changes, primarily facet hypertrophy. Other neck: No mass or adenopathy. Upper chest:  No apical lung mass. Review of the MIP images confirms the above findings CTA HEAD FINDINGS Anterior circulation: Intracranial internal carotid arteries are patent with minor calcified plaque. Anterior and middle cerebral arteries are patent. Posterior circulation: Intracranial vertebral arteries, basilar artery, and posterior cerebral arteries are patent. Venous sinuses: As permitted by contrast timing, patent. Review of the MIP images confirms the above findings CT Brain Perfusion Findings: CBF (<30%) Volume: 019mPerfusion (Tmax>6.0s) volume: 19m33mismatch Volume: 19mL719mfarction Location:None IMPRESSION: No large vessel occlusion or hemodynamically significant stenosis. Perfusion imaging does not demonstrate evidence of core infarction or territory at risk. Plaque at the left greater than right ICA origins causing less than 50% stenosis. Plaque at the vertebral artery origins causing at least moderate stenosis. Electronically Signed   By: PranMacy Mis.   On: 02/19/2020 14:17   DG Chest Portable 1 View  Result Date: 02/19/2020 CLINICAL DATA:  Weakness EXAM: PORTABLE CHEST 1 VIEW COMPARISON:  04/06/2019 FINDINGS: Prior CABG. Stable heart size. No focal airspace consolidation, pleural effusion,  or pneumothorax. IMPRESSION: No active disease. Electronically Signed   By: Davina Poke D.O.   On: 02/19/2020 15:07   ECHOCARDIOGRAM COMPLETE BUBBLE STUDY  Result Date: 02/20/2020    ECHOCARDIOGRAM REPORT   Patient Name:   [name] [name] Date of Exam: 02/20/2020 Medical Rec #:  [id number]       Height:       69.0 in Accession #:    [id number]      Weight:       236.1 lb Date of Birth:  Jan 24, 1956       BSA:          2.217 m Patient Age:    64 years        BP:           131/77 mmHg Patient Gender: M               HR:           69 bpm. Exam Location:  [facility] Procedure: 2D Echo Indications:    Stroke 434.91 / I63.9  History:        Patient has prior history of Echocardiogram examinations, most                  recent 11/21/2018. CHF, CAD, Prior CABG, TIA,                 Signs/Symptoms:Chest Pain; Risk Factors:Hypertension, Diabetes,                 Dyslipidemia and Non-Smoker.  Sonographer:    Leavy Cella RDCS (AE) Referring Phys: [id number] [place]  1. Left ventricular ejection fraction, by estimation, is 55 to 60%. The left ventricle has normal function. The left ventricle has no regional wall motion abnormalities. There is mild left ventricular hypertrophy. Left ventricular diastolic parameters were normal.  2. Right ventricular systolic function is normal. The right ventricular size is normal. There is mildly elevated pulmonary artery systolic pressure. The estimated right ventricular systolic pressure is 49.6 mmHg.  3. No right to left interatrial shunting noted with saline contrast.  4. The mitral valve is grossly normal. Trivial mitral valve regurgitation.  5. The aortic valve is tricuspid. Aortic valve regurgitation is mild. Mild aortic valve sclerosis is present, with no evidence of aortic valve stenosis.  6. The inferior vena cava is dilated in size with >50% respiratory variability, suggesting right atrial pressure of 8 mmHg. FINDINGS  Left Ventricle: Left ventricular ejection fraction, by estimation, is 55 to 60%. The left ventricle has normal function. The left ventricle has no regional wall motion abnormalities. The left ventricular internal cavity size was normal in size. There is  mild left ventricular hypertrophy. Left ventricular diastolic parameters were normal. Right Ventricle: The right ventricular size is normal. No increase in right ventricular wall thickness. Right ventricular systolic function is normal. There is mildly elevated pulmonary artery systolic pressure. The tricuspid regurgitant velocity is 2.43  m/s, and with an assumed right atrial pressure of 8 mmHg, the estimated right ventricular systolic pressure is 75.9 mmHg. Left Atrium: Left atrial size was normal in  size. Right Atrium: Right atrial size was normal in size. Pericardium: There is no evidence of pericardial effusion. Mitral Valve: The mitral valve is grossly normal. Mild mitral annular calcification. Trivial mitral valve regurgitation. Tricuspid Valve: The tricuspid valve is grossly normal. Tricuspid valve regurgitation is trivial. Aortic Valve: The aortic valve is tricuspid. Aortic valve regurgitation is mild. Aortic regurgitation PHT  measures 460 msec. Mild aortic valve sclerosis is present, with no evidence of aortic valve stenosis. Mild to moderate aortic valve annular calcification. Pulmonic Valve: The pulmonic valve was grossly normal. Pulmonic valve regurgitation is trivial. Aorta: The aortic root is normal in size and structure. Venous: The inferior vena cava is dilated in size with greater than 50% respiratory variability, suggesting right atrial pressure of 8 mmHg. IAS/Shunts: No atrial level shunt detected by color flow Doppler. Agitated saline contrast was given intravenously to evaluate for intracardiac shunting.  LEFT VENTRICLE PLAX 2D LVIDd:         3.85 cm  Diastology LVIDs:         2.06 cm  LV e' lateral:   10.20 cm/s LV PW:         1.02 cm  LV E/e' lateral: 6.3 LV IVS:        1.16 cm  LV e' medial:    5.98 cm/s LVOT diam:     2.10 cm  LV E/e' medial:  10.8 LV SV:         67 LV SV Index:   30 LVOT Area:     3.46 cm  RIGHT VENTRICLE RV S prime:     8.05 cm/s TAPSE (M-mode): 1.0 cm LEFT ATRIUM           Index LA diam:      3.30 cm 1.49 cm/m LA Vol (A2C): 38.3 ml 17.28 ml/m LA Vol (A4C): 38.4 ml 17.32 ml/m  AORTIC VALVE LVOT Vmax:   76.40 cm/s LVOT Vmean:  54.500 cm/s LVOT VTI:    0.193 m AI PHT:      460 msec  AORTA Ao Root diam: 3.10 cm MITRAL VALVE               TRICUSPID VALVE MV Area (PHT): 2.48 cm    TR Peak grad:   23.6 mmHg MV Decel Time: 306 msec    TR Vmax:        243.00 cm/s MV E velocity: 64.70 cm/s MV A velocity: 56.10 cm/s  SHUNTS MV E/A ratio:  1.15        Systemic VTI:  0.19 m                             Systemic Diam: 2.10 cm Rozann Lesches MD Electronically signed by Rozann Lesches MD Signature Date/Time: 02/20/2020/10:02:25 AM    Final    CT HEAD CODE STROKE WO CONTRAST  Result Date: 02/19/2020 CLINICAL DATA:  Code stroke. EXAM: CT HEAD WITHOUT CONTRAST TECHNIQUE: Contiguous axial images were obtained from the base of the skull through the vertex without intravenous contrast. COMPARISON:  2018 FINDINGS: Brain: There is no acute intracranial hemorrhage, mass effect, or edema. Gray-white differentiation remains preserved. Ventricles and sulci are within normal limits in size and configuration. Minimal patchy hypoattenuation in the supratentorial white matter is nonspecific but may reflect minor chronic microvascular ischemic changes. There is no extra-axial fluid collection. Vascular: No hyperdense vessel. There is intracranial atherosclerotic calcification at the skull base. Skull: Unremarkable. Sinuses/Orbits: No acute finding. Other: Mastoid air cells are clear. ASPECTS ([place] Stroke Program Early CT Score) - Ganglionic level infarction (caudate, lentiform nuclei, internal capsule, insula, M1-M3 cortex): 7 - Supraganglionic infarction (M4-M6 cortex): 3 Total score (0-10 with 10 being normal): 10 IMPRESSION: No acute intracranial hemorrhage or evidence of acute infarction. ASPECT score is 10. These results were called by telephone at the time  of interpretation on 02/19/2020 at 1:51 pm to provider Davonna Belling , who verbally acknowledged these results. Electronically Signed   By: Macy Mis M.D.   On: 02/19/2020 13:53     Discharge Exam: Vitals:   02/20/20 0720 02/20/20 0935  BP:  125/87  Pulse:  70  Resp:  18  Temp:  98.7 F (37.1 C)  SpO2: 98% 98%   Vitals:   02/20/20 0558 02/20/20 0617 02/20/20 0720 02/20/20 0935  BP: 131/77   125/87  Pulse: 69   70  Resp: 18   18  Temp: 97.8 F (36.6 C)   98.7 F (37.1 C)  TempSrc: Oral   Oral  SpO2: 97%  98% 98%   Weight:  107.1 kg    Height:        General: Pt is alert, awake, not in acute distress Cardiovascular: RRR, S1/S2 +, no rubs, no gallops Respiratory: CTA bilaterally, no wheezing, no rhonchi Abdominal: Soft, NT, ND, bowel sounds + Extremities: no edema, no cyanosis    The results of significant diagnostics from this hospitalization (including imaging, microbiology, ancillary and laboratory) are listed below for reference.     Microbiology: Recent Results (from the past 240 hour(s))  SARS CORONAVIRUS 2 (TAT 6-24 HRS) Nasopharyngeal Nasopharyngeal Swab     Status: None   Collection Time: 02/19/20  2:42 PM   Specimen: Nasopharyngeal Swab  Result Value Ref Range Status   SARS Coronavirus 2 NEGATIVE NEGATIVE Final    Comment: (NOTE) SARS-CoV-2 target nucleic acids are NOT DETECTED. The SARS-CoV-2 RNA is generally detectable in upper and lower respiratory specimens during the acute phase of infection. Negative results do not preclude SARS-CoV-2 infection, do not rule out co-infections with other pathogens, and should not be used as the sole basis for treatment or other patient management decisions. Negative results must be combined with clinical observations, patient history, and epidemiological information. The expected result is Negative. Fact Sheet for Patients: SugarRoll.be Fact Sheet for Healthcare Providers: https://www.woods-mathews.com/ This test is not yet approved or cleared by the [place] FDA and  has been authorized for detection and/or diagnosis of SARS-CoV-2 by FDA under an Emergency Use Authorization (EUA). This EUA will remain  in effect (meaning this test can be used) for the duration of the COVID-19 declaration under Section 56 4(b)(1) of the Act, 21 U.S.C. section 360bbb-3(b)(1), unless the authorization is terminated or revoked sooner. Performed at [facility], [place] [address] [place], [place] [address]       Labs: BNP (last 3 results) No results for input(s): BNP in the last 8760 hours. Basic Metabolic Panel: Recent Labs  Lab 02/19/20 1438 02/20/20 0452  NA 135 142  K 4.2 3.9  CL 105 110  CO2 21* 23  GLUCOSE 269* 121*  BUN 15 14  CREATININE 1.00 0.97  CALCIUM 8.5* 9.0  MG  --  1.8   Liver Function Tests: Recent Labs  Lab 02/19/20 1438 02/20/20 0452  AST 41 38  ALT 40 40  ALKPHOS 50 42  BILITOT 0.6 0.6  PROT 6.6 6.8  ALBUMIN 3.3* 3.4*   No results for input(s): LIPASE, AMYLASE in the last 168 hours. No results for input(s): AMMONIA in the last 168 hours. CBC: Recent Labs  Lab 02/19/20 1438 02/20/20 0452  WBC 1.9* 2.6*  NEUTROABS 1.0* 1.3*  HGB 14.7 14.7  HCT 42.5 43.2  MCV 91.0 92.5  PLT 73* 71*   Cardiac Enzymes: No results for input(s): CKTOTAL, CKMB, CKMBINDEX,  TROPONINI in the last 168 hours. BNP: Invalid input(s): POCBNP CBG: Recent Labs  Lab 02/19/20 1401 02/19/20 1432 02/19/20 2146 02/20/20 0827 02/20/20 1143  GLUCAP 278* 269* 242* 133* 156*   D-Dimer No results for input(s): DDIMER in the last 72 hours. Hgb A1c No results for input(s): HGBA1C in the last 72 hours. Lipid Profile Recent Labs    02/20/20 0452  CHOL 150  HDL 26*  LDLCALC 86  TRIG 191*  CHOLHDL 5.8   Thyroid function studies Recent Labs    02/20/20 0452  TSH 2.203   Anemia work up No results for input(s): VITAMINB12, FOLATE, FERRITIN, TIBC, IRON, RETICCTPCT in the last 72 hours. Urinalysis    Component Value Date/Time   COLORURINE AMBER (A) 11/25/2018 2144   APPEARANCEUR HAZY (A) 11/25/2018 2144   LABSPEC 1.028 11/25/2018 2144   PHURINE 5.0 11/25/2018 2144   GLUCOSEU NEGATIVE 11/25/2018 2144   HGBUR SMALL (A) 11/25/2018 2144   BILIRUBINUR NEGATIVE 11/25/2018 2144   KETONESUR NEGATIVE 11/25/2018 2144   PROTEINUR 100 (A) 11/25/2018 2144   UROBILINOGEN 0.2 12/04/2013 0730   NITRITE NEGATIVE 11/25/2018 2144   LEUKOCYTESUR NEGATIVE 11/25/2018 2144    Sepsis Labs Invalid input(s): PROCALCITONIN,  WBC,  LACTICIDVEN Microbiology Recent Results (from the past 240 hour(s))  SARS CORONAVIRUS 2 (TAT 6-24 HRS) Nasopharyngeal Nasopharyngeal Swab     Status: None   Collection Time: 02/19/20  2:42 PM   Specimen: Nasopharyngeal Swab  Result Value Ref Range Status   SARS Coronavirus 2 NEGATIVE NEGATIVE Final    Comment: (NOTE) SARS-CoV-2 target nucleic acids are NOT DETECTED. The SARS-CoV-2 RNA is generally detectable in upper and lower respiratory specimens during the acute phase of infection. Negative results do not preclude SARS-CoV-2 infection, do not rule out co-infections with other pathogens, and should not be used as the sole basis for treatment or other patient management decisions. Negative results must be combined with clinical observations, patient history, and epidemiological information. The expected result is Negative. Fact Sheet for Patients: SugarRoll.be Fact Sheet for Healthcare Providers: https://www.woods-mathews.com/ This test is not yet approved or cleared by the [place] FDA and  has been authorized for detection and/or diagnosis of SARS-CoV-2 by FDA under an Emergency Use Authorization (EUA). This EUA will remain  in effect (meaning this test can be used) for the duration of the COVID-19 declaration under Section 56 4(b)(1) of the Act, 21 U.S.C. section 360bbb-3(b)(1), unless the authorization is terminated or revoked sooner. Performed at [facility], [place] [address], [place], [place] [address]      Time coordinating discharge: 35 minutes  SIGNED:   Rodena Goldmann, DO [facility] 02/20/2020, 1:18 PM  If 7PM-7AM, please contact night-coverage [email or web address]

## 2020-02-20 NOTE — Evaluation (Signed)
Occupational Therapy Evaluation Patient Details Name: [name] [name] MRN: [id number] DOB: Feb 28, 1956 Today's Date: 02/20/2020    History of Present Illness [name] [name] is a 64 y.o. 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 MRI pending   Clinical Impression   Pt agreeable to OT evaluation, wife present. Pt requiring increased time for answering questions, reports difficulty with word finding "sometimes."  Pt performing ADLs and mobility with supervision, using RW for mobility so he doesn't "wiggle." Pt demonstrates BUE strength WNL, coordination and sensation are intact. Pt does reports vision change-blurry vision, has an upcoming appt with nuclear ophthalmologist due to right vision issues of diplopia and eye alignment since prior CVA.  Wife reports continued right facial droop. Pt is at baseline with ADL completion, no further OT services required at this time.     Follow Up Recommendations  No OT follow up;Supervision/Assistance - 24 hour    Equipment Recommendations  None recommended by OT    Recommendations for Other Services Speech consult     Precautions / Restrictions Precautions Precautions: None Restrictions Weight Bearing Restrictions: No      Mobility Bed Mobility Overal bed mobility: Modified Independent                Transfers Overall transfer level: Needs assistance Equipment used: Rolling walker (2 wheeled) Transfers: Sit to/from Stand;Stand Pivot Transfers Sit to Stand: Supervision Stand pivot transfers: Supervision       General transfer comment: pt reports using RW so he doesn't "wiggle"        ADL either performed or assessed with clinical judgement   ADL Overall  ADL's : Needs assistance/impaired     Grooming: Wash/dry hands;Oral care;Supervision/safety;Standing Grooming Details (indicate cue type and reason): performing grooming while standing at sink with supervision. 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) Vision Assessment?: Yes Eye Alignment: Within Functional Limits Ocular Range of Motion: Within Functional Limits Alignment/Gaze Preference: Within Defined Limits Tracking/Visual Pursuits: Able to track stimulus in all quads without difficulty Saccades: Within functional limits Convergence: Within functional limits Visual Fields: No apparent deficits Additional Comments: Not experiencing diplopia today, does report blurry vision in left eye that is new            Pertinent Vitals/Pain Pain Assessment: Faces Faces Pain Scale: Hurts a little bit Pain Location: temples Pain Descriptors / Indicators: Headache Pain Intervention(s): Limited activity within patient's tolerance;Monitored during session     Hand Dominance Right   Extremity/Trunk Assessment Upper Extremity Assessment Upper Extremity Assessment: Overall WFL for tasks assessed;LUE deficits/detail LUE Deficits / Details: ROM is 50% at baseline due to bone spurs   Lower Extremity Assessment Lower Extremity Assessment: Defer to PT evaluation   Cervical / Trunk Assessment Cervical / Trunk Assessment: Normal   Communication  Communication Communication: Expressive  difficulties(increased time for responding)   Cognition Arousal/Alertness: Awake/alert Behavior During Therapy: WFL for tasks assessed/performed Overall Cognitive Status: Impaired/Different from baseline                                 General Comments: wife reports "deer in the headlights" look and pt requiring increased time for responding and answering questions              Home Living Family/patient expects to be discharged to:: Private residence Living Arrangements: Spouse/significant other Available Help at Discharge: Family;Available PRN/intermittently Type of Home: House Home Access: Stairs to enter [employer] of Steps: 3-4 Entrance Stairs-Rails: Right;Left Home Layout: One level     Bathroom Shower/Tub: Teacher, early years/pre: Standard     Home Equipment: Environmental consultant - 2 wheels;Cane - single point;Shower seat;Grab bars - tub/shower          Prior Functioning/Environment Level of Independence: Independent                 OT Problem List: Decreased activity tolerance       AM-PAC OT "6 Clicks" Daily Activity     Outcome Measure Help from another person eating meals?: None Help from another person taking care of personal grooming?: None Help from another person toileting, which includes using toliet, bedpan, or urinal?: None Help from another person bathing (including washing, rinsing, drying)?: None Help from another person to put on and taking off regular upper body clothing?: None Help from another person to put on and taking off regular lower body clothing?: None 6 Click Score: 24   End of Session Equipment Utilized During Treatment: Rolling walker  Activity Tolerance: Patient tolerated treatment well Patient left: in bed;with call bell/phone within reach;with family/visitor present;Other (comment)(US in room)  OT Visit Diagnosis: Muscle weakness (generalized) (M62.81)                 Time: 0973-5329 OT Time Calculation (min): 32 min Charges:  OT General Charges $OT Visit: 1 Visit OT Evaluation $OT Eval Low Complexity: Allen, OTR/L  [phone] 02/20/2020, 8:33 AM

## 2020-02-20 NOTE — Telephone Encounter (Signed)
Returned call. No answer. Left msg to call back

## 2020-02-20 NOTE — Evaluation (Signed)
Physical Therapy Evaluation Patient Details Name: [name] [name] MRN: [id number] DOB: 1956/10/12 Today's Date: 02/20/2020   History of Present Illness  [name] [name] is a 65 y.o. 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 MRI pending    Clinical Impression  Patient able to complete bed mobility slowly but without assist. He demonstrates LE strength WFL while seated and is able to transfer to standing without use of AD. Some unsteadiness upon standing initially. Patient ambulates with decreased cadence compared to baseline wife states. After ambulating 30 feet, patient experiences LLE cramping and pain requiring assist to remain standing. RN brought chair for patient to sit. Patient returned to room and stated cramping was better. Patient slow to respond throughout session and intermittently did not respond when question was asked to him. Patient's wife states he seems "dazed and confused" and "deer in headlights". Patient left with hospital staff to be transported for testing. Patient will benefit from continued physical therapy in hospital and recommended venue below to increase strength, balance, endurance for safe ADLs and gait.     Follow Up Recommendations Home health PT;Supervision for mobility/OOB    Equipment Recommendations  Rolling walker with 5" wheels    Recommendations for Other Services Speech consult     Precautions / Restrictions Precautions Precautions: Fall Restrictions Weight Bearing Restrictions: No      Mobility  Bed Mobility Overal bed mobility: Modified Independent             General bed mobility comments: slow, labored, uses bed rails  Transfers Overall transfer level: Needs  assistance Equipment used: None Transfers: Sit to/from Stand;Stand Pivot Transfers Sit to Stand: Min guard Stand pivot transfers: Min guard       General transfer comment: min guard for safety and balance, min/mod unsteadiness upon standing  Ambulation/Gait Ambulation/Gait assistance: Min assist Gait Distance (Feet): 30 Feet Assistive device: None Gait Pattern/deviations: Decreased step length - left;Decreased step length - right Gait velocity: decreased   General Gait Details: slow, labored, patient able to ambulate 30 feet before loss of balance due to calf cramping/pain requiring assist to remain standing, RN brought chair for patient to rest  Stairs            Wheelchair Mobility    Modified Rankin (Stroke Patients Only)       Balance Overall balance assessment: Needs assistance Sitting-balance support: No upper extremity supported;Feet supported Sitting balance-Leahy Scale: Normal Sitting balance - Comments: seated EOB   Standing balance support: No upper extremity supported;During functional activity Standing balance-Leahy Scale: Fair Standing balance comment: fair/poor without AD, loss of balance due to L calf cramp/pain while ambulating                             Pertinent Vitals/Pain Pain Assessment: No/denies pain Faces Pain Scale: Hurts a little bit Pain Location: temples Pain Descriptors / Indicators: Headache Pain Intervention(s): Limited activity within patient's tolerance;Monitored during session    Home Living Family/patient expects to be discharged to:: Private residence Living Arrangements: Spouse/significant other Available Help at Discharge: Family;Available PRN/intermittently Type of Home: House Home Access: Stairs to enter Entrance Stairs-Rails: Psychiatric nurse of Steps: 3-4 Home Layout: One level Home Equipment: Cane - single point;Shower seat;Grab bars -  tub/shower      Prior Function Level of  Independence: Independent         Comments: Patient states independent, limited community ambulator without AD     Hand Dominance   Dominant Hand: Right    Extremity/Trunk Assessment   Upper Extremity Assessment Upper Extremity Assessment: Defer to OT evaluation LUE Deficits / Details: ROM is 50% at baseline due to bone spurs    Lower Extremity Assessment Lower Extremity Assessment: Overall WFL for tasks assessed    Cervical / Trunk Assessment Cervical / Trunk Assessment: Normal  Communication   Communication: Expressive difficulties(increased time for responding)  Cognition Arousal/Alertness: Awake/alert Behavior During Therapy: WFL for tasks assessed/performed Overall Cognitive Status: Impaired/Different from baseline                                 General Comments: wife reports "deer in the headlights" look as well as "dazed and confused"  and pt requiring increased time for responding and answering questions      General Comments      Exercises     Assessment/Plan    PT Assessment Patient needs continued PT services  PT Problem List Decreased strength;Decreased knowledge of use of DME;Decreased activity tolerance;Decreased safety awareness;Decreased balance;Decreased mobility;Pain       PT Treatment Interventions DME instruction;Gait training;Balance training;Neuromuscular re-education;Stair training;Functional mobility training;Patient/family education;Therapeutic activities;Therapeutic exercise    PT Goals (Current goals can be found in the Care Plan section)  Acute Rehab PT Goals Patient Stated Goal: Return home with wife PT Goal Formulation: With patient Time For Goal Achievement: 03/05/20 Potential to Achieve Goals: Good    Frequency Min 3X/week   Barriers to discharge        Co-evaluation               AM-PAC PT "6 Clicks" Mobility  Outcome Measure Help needed turning from your back to your side while in a flat bed  without using bedrails?: None Help needed moving from lying on your back to sitting on the side of a flat bed without using bedrails?: None Help needed moving to and from a bed to a chair (including a wheelchair)?: A Little Help needed standing up from a chair using your arms (e.g., wheelchair or bedside chair)?: A Little Help needed to walk in hospital room?: A Little Help needed climbing 3-5 steps with a railing? : A Lot 6 Click Score: 19    End of Session Equipment Utilized During Treatment: Gait belt Activity Tolerance: Patient tolerated treatment well;Patient limited by pain Patient left: Other (comment)(with hospital staff to transport for procedure/testing) Nurse Communication: Mobility status PT Visit Diagnosis: Unsteadiness on feet (R26.81);Other abnormalities of gait and mobility (R26.89);Muscle weakness (generalized) (M62.81);Other symptoms and signs involving the nervous system (R29.898)    Time: 1011-1030 PT Time Calculation (min) (ACUTE ONLY): 19 min   Charges:   PT Evaluation $PT Eval Moderate Complexity: 1 Mod PT Treatments $Therapeutic Activity: 8-22 mins        11:23 AM, 02/20/20 Mearl Latin PT, DPT Physical Therapist at [facility]

## 2020-02-21 LAB — HEMOGLOBIN A1C
Hgb A1c MFr Bld: 8.1 % — ABNORMAL HIGH (ref 4.8–5.6)
Mean Plasma Glucose: 186 mg/dL

## 2020-02-21 NOTE — Telephone Encounter (Signed)
He was recently discharged from [facility] ED who recommended home health therapy -order should have been placed at that time.  They will need to reach out to provider at discharge patient in order to obtain orders.

## 2020-02-21 NOTE — Telephone Encounter (Signed)
I spoke with Dr. Leonie Man about pt was seen at [facility] for facial drooping and had stroke work up. I stated the home health agency needs orders signed by MD. The pts PCP is in [place] and they cannot take orders from out of state MD. Per Dr.Sethi he can give orders for therapy if needed. He stated pt needs an appt within 2 to 3 weeks with him.

## 2020-02-21 NOTE — Telephone Encounter (Signed)
[name] [name] @ [facility] has called this morning stating that the hospital did sign the initial order but will not sign for continuous PT.  [name] [name] said they would normally reach out to PCP but this pt's PCP is in [place] and they can not take signed orders from a Dr in another state.  [name] [name] said if Janett Billow, NP does sign off [name] [name] would also  Need the over signing MD.  Please call [name] [name] @ [phone]

## 2020-02-21 NOTE — Telephone Encounter (Signed)
I called back and relayed the message that JM/NP left for her.

## 2020-02-21 NOTE — Care Management (Addendum)
02/21/20: 1200 Received message for nurse on [address], that wife called upset that home health was not arranged.   Call to wife, all numbers on chart, no answer at any number and 2 numbers do not have VM set up. Left VM on wife's mobile number. Home health was discussed with patient, he agreed to home health. Most recently had used [facility] in the past and will use them again.   Referral was made to Santiago Glad of [facility] 4/13.   Call placed to Santiago Glad again today, she plans to reach out to patient today. [facility] is currently reaching out to patient's [place] physicians to ask if they will sign continued home health orders (patient's PCP is in [place] and [place] can not accept signature from [place] doctors). Updated Santiago Glad that should no MD be willing to sign orders that a [place] MD will sign orders.

## 2020-02-21 NOTE — Telephone Encounter (Signed)
Pt wife called to check on PT orders and would also like a cb in regards to pt HFU visit states he was referred to another neurology office for a 1 week FU and they were unsure why that was and would prefer pt FU at [facility]

## 2020-02-21 NOTE — Telephone Encounter (Signed)
I spoke with Karen,HHN,  the hospitalist has signed for PT as they ordered it. He has f/u apt with Dr.McDowell in June at the [facility] office. His pcp Dr.Waters has retired and I advised Santiago Glad that [place] office has a new physician that is seeing new patients and advised them to call for an apt.

## 2020-02-21 NOTE — Telephone Encounter (Signed)
I spoke to [name] [name] with  [facility] agency [facility] and they are needing someone to sign orders (for PT not place orders).  I relayed that we saw him recently in office for cpap/ stroke f/u

## 2020-02-21 NOTE — Telephone Encounter (Signed)
Can you please assist patient with scheduling follow-up with Dr. Leonie Man as he seen at outside hospital.  If they need supervising MD to sign orders, this will need to be done through Dr. Leonie Man -Katrina will be able to assist with this.

## 2020-02-22 NOTE — Telephone Encounter (Signed)
Patient schedule with Dr.Sethi for hospital follow up.

## 2020-02-22 NOTE — Telephone Encounter (Signed)
I call Santiago Glad back with [facility]. She explain they did get in contact with the discharge hospital to explain the pts PCP cannot sign orders because he is in [place]. The discharge hospitalitis MD will only sign orders for 30 days only. She stated they cannot accept MD signature or orders out of state which is [place]. I stated Dr.Sethi is willing to sign therapy orders for pt but he needs an appt with 4 to 6 weeks of discharge date 02/20/2020.Santiago Glad stated the wife informed her that the PCP listed retired and pt needs to find a new PCP. She appreciate the call.

## 2020-02-22 NOTE — Telephone Encounter (Signed)
Left vm for Santiago Glad at [facility] about orders for pt.

## 2020-02-22 NOTE — Telephone Encounter (Signed)
I called pts wife cell number to try to schedule follow up appt with Dr.Sethi. Before I could say he needs an appt the wife told me to call his cell phone.

## 2020-02-22 NOTE — Telephone Encounter (Signed)
I called  pts listed cell number and I could not leave message because it was full.

## 2020-02-28 NOTE — Progress Notes (Signed)
CT CODE STROKE TIMES 1336 CALLTIME 1336 BEEPER TIME 1342 EXAM STARTED 1343 EXAM FINISHED 1343 IMAGES SENT TO SOC 1345 EXAM COMPLETED 1245 [facility] CALLED

## 2020-03-05 ENCOUNTER — Other Ambulatory Visit (INDEPENDENT_AMBULATORY_CARE_PROVIDER_SITE_OTHER): Payer: Self-pay | Admitting: *Deleted

## 2020-03-05 DIAGNOSIS — K7460 Unspecified cirrhosis of liver: Secondary | ICD-10-CM

## 2020-03-05 DIAGNOSIS — D508 Other iron deficiency anemias: Secondary | ICD-10-CM

## 2020-03-05 DIAGNOSIS — K7581 Nonalcoholic steatohepatitis (NASH): Secondary | ICD-10-CM

## 2020-03-06 ENCOUNTER — Emergency Department (HOSPITAL_COMMUNITY): Payer: Medicare Other

## 2020-03-06 ENCOUNTER — Emergency Department (HOSPITAL_COMMUNITY)
Admission: EM | Admit: 2020-03-06 | Discharge: 2020-03-06 | Disposition: A | Discharge status: Home / Self Care | Payer: Medicare Other | Attending: Emergency Medicine | Admitting: Emergency Medicine

## 2020-03-06 ENCOUNTER — Encounter (HOSPITAL_COMMUNITY): Payer: Self-pay

## 2020-03-06 ENCOUNTER — Other Ambulatory Visit: Payer: Self-pay

## 2020-03-06 DIAGNOSIS — Z7902 Long term (current) use of antithrombotics/antiplatelets: Secondary | ICD-10-CM | POA: Insufficient documentation

## 2020-03-06 DIAGNOSIS — I2510 Atherosclerotic heart disease of native coronary artery without angina pectoris: Secondary | ICD-10-CM | POA: Diagnosis not present

## 2020-03-06 DIAGNOSIS — E119 Type 2 diabetes mellitus without complications: Secondary | ICD-10-CM | POA: Diagnosis not present

## 2020-03-06 DIAGNOSIS — Z8673 Personal history of transient ischemic attack (TIA), and cerebral infarction without residual deficits: Secondary | ICD-10-CM | POA: Diagnosis not present

## 2020-03-06 DIAGNOSIS — Z79899 Other long term (current) drug therapy: Secondary | ICD-10-CM | POA: Insufficient documentation

## 2020-03-06 DIAGNOSIS — K7581 Nonalcoholic steatohepatitis (NASH): Secondary | ICD-10-CM | POA: Diagnosis not present

## 2020-03-06 DIAGNOSIS — I110 Hypertensive heart disease with heart failure: Secondary | ICD-10-CM | POA: Diagnosis not present

## 2020-03-06 DIAGNOSIS — D696 Thrombocytopenia, unspecified: Secondary | ICD-10-CM | POA: Diagnosis not present

## 2020-03-06 DIAGNOSIS — R05 Cough: Secondary | ICD-10-CM | POA: Insufficient documentation

## 2020-03-06 DIAGNOSIS — I5031 Acute diastolic (congestive) heart failure: Secondary | ICD-10-CM | POA: Diagnosis not present

## 2020-03-06 DIAGNOSIS — Z20822 Contact with and (suspected) exposure to covid-19: Secondary | ICD-10-CM | POA: Diagnosis not present

## 2020-03-06 DIAGNOSIS — R059 Cough, unspecified: Secondary | ICD-10-CM

## 2020-03-06 DIAGNOSIS — Z794 Long term (current) use of insulin: Secondary | ICD-10-CM | POA: Diagnosis not present

## 2020-03-06 DIAGNOSIS — R509 Fever, unspecified: Secondary | ICD-10-CM | POA: Diagnosis not present

## 2020-03-06 DIAGNOSIS — R410 Disorientation, unspecified: Secondary | ICD-10-CM | POA: Diagnosis not present

## 2020-03-06 DIAGNOSIS — Z951 Presence of aortocoronary bypass graft: Secondary | ICD-10-CM | POA: Diagnosis not present

## 2020-03-06 DIAGNOSIS — R6889 Other general symptoms and signs: Secondary | ICD-10-CM

## 2020-03-06 LAB — BASIC METABOLIC PANEL
Anion gap: 12 (ref 5–15)
BUN: 16 mg/dL (ref 8–23)
CO2: 22 mmol/L (ref 22–32)
Calcium: 8.9 mg/dL (ref 8.9–10.3)
Chloride: 102 mmol/L (ref 98–111)
Creatinine, Ser: 1.19 mg/dL (ref 0.61–1.24)
GFR calc Af Amer: >60 mL/min (ref >60)
GFR calc non Af Amer: >60 mL/min (ref >60)
Glucose, Bld: 233 mg/dL — ABNORMAL HIGH (ref 70–99)
Potassium: 3.7 mmol/L (ref 3.5–5.1)
Sodium: 136 mmol/L (ref 135–145)

## 2020-03-06 LAB — RESPIRATORY PANEL BY RT PCR (FLU A&B, COVID)
Influenza A by PCR: NEGATIVE
Influenza B by PCR: NEGATIVE
SARS Coronavirus 2 by RT PCR: NEGATIVE

## 2020-03-06 LAB — CBC WITH DIFFERENTIAL/PLATELET
Abs Immature Granulocytes: 0.01 10*3/uL (ref 0.00–0.07)
Basophils Absolute: 0 10*3/uL (ref 0.0–0.1)
Basophils Relative: 0 %
Eosinophils Absolute: 0.1 10*3/uL (ref 0.0–0.5)
Eosinophils Relative: 2 %
HCT: 44.2 % (ref 39.0–52.0)
Hemoglobin: 15.4 g/dL (ref 13.0–17.0)
Immature Granulocytes: 0 %
Lymphocytes Relative: 17 %
Lymphs Abs: 0.8 10*3/uL (ref 0.7–4.0)
MCH: 31.7 pg (ref 26.0–34.0)
MCHC: 34.8 g/dL (ref 30.0–36.0)
MCV: 90.9 fL (ref 80.0–100.0)
Monocytes Absolute: 0.4 10*3/uL (ref 0.1–1.0)
Monocytes Relative: 8 %
Neutro Abs: 3.4 10*3/uL (ref 1.7–7.7)
Neutrophils Relative %: 73 %
Platelets: 72 10*3/uL — ABNORMAL LOW (ref 150–400)
RBC: 4.86 MIL/uL (ref 4.22–5.81)
RDW: 13.8 % (ref 11.5–15.5)
WBC: 4.7 10*3/uL (ref 4.0–10.5)
nRBC: 0 % (ref 0.0–0.2)

## 2020-03-06 LAB — HEPATIC FUNCTION PANEL
ALT: 36 U/L (ref 0–44)
AST: 36 U/L (ref 15–41)
Albumin: 3.5 g/dL (ref 3.5–5.0)
Alkaline Phosphatase: 43 U/L (ref 38–126)
Bilirubin, Direct: 0.3 mg/dL — ABNORMAL HIGH (ref 0.0–0.2)
Indirect Bilirubin: 0.7 mg/dL (ref 0.3–0.9)
Total Bilirubin: 1 mg/dL (ref 0.3–1.2)
Total Protein: 7.3 g/dL (ref 6.5–8.1)

## 2020-03-06 LAB — URINALYSIS, ROUTINE W REFLEX MICROSCOPIC
Bacteria, UA: NONE SEEN
Bilirubin Urine: NEGATIVE
Glucose, UA: 50 mg/dL — AB
Ketones, ur: NEGATIVE mg/dL
Leukocytes,Ua: NEGATIVE
Nitrite: NEGATIVE
Protein, ur: 30 mg/dL — AB
Specific Gravity, Urine: 1.019 (ref 1.005–1.030)
pH: 5 (ref 5.0–8.0)

## 2020-03-06 LAB — AMMONIA: Ammonia: 31 umol/L (ref 9–35)

## 2020-03-06 MED ORDER — DOXYCYCLINE HYCLATE 100 MG PO CAPS: 100.0000 mg | ORAL_CAPSULE | Freq: Two times a day (BID) | ORAL | 0 refills | Status: DC

## 2020-03-06 MED ORDER — ACETAMINOPHEN 500 MG PO TABS
1000.0000 mg | ORAL_TABLET | Freq: Once | ORAL | Status: AC
  Administered 2020-03-06: 1000 mg via ORAL
  Filled 2020-03-06: qty 2

## 2020-03-06 NOTE — ED Triage Notes (Signed)
Pt reports cough, fever, chills, body aches x 2 days. Reports family recently had bronchitis but tested negative for covid.

## 2020-03-06 NOTE — ED Notes (Signed)
Ambulated pt stats stayed in low 90's, 93 to 95.

## 2020-03-06 NOTE — ED Notes (Signed)
Family updated as to patient's status. Pt's wife notified of SARS result.

## 2020-03-06 NOTE — Discharge Instructions (Addendum)
Use Tylenol every 4 hours as needed for pain and fevers.  Follow-up close with your primary doctor for recheck in next 48 hrs. Return to the emergency room for shortness of breath, or new concerns. Take antibiotics in case this is an atypical pneumonia.

## 2020-03-06 NOTE — ED Provider Notes (Addendum)
[facility] EMERGENCY DEPARTMENT Provider Note   CSN: [id number] Arrival date & time: 03/06/20  9373     History Chief Complaint  Patient presents with  . Cough  . Fever    [name] [name] is a 64 y.o. male.  Patient with history of cirrhosis, TIA, reflux presents with cough, fever, chills and body aches for 2 days.  Mild shortness of breath.  Family member with bronchitis recently.  Patient has never had Covid.  Patient has not had vaccines.        Past Medical History:  Diagnosis Date  . Anxiety   . Arthritis   . Asthma   . Cataract    Right eye  . Chronic lower back pain   . Cirrhosis of liver ([place])   . Coronary atherosclerosis of native coronary artery    a. LAD & OM stenting;  b. 2007 Cath: nonobs dzs, patent stents;  c. 07/2012 neg MV, EF 61%.  d. 11/20/14: [place] s/p  PCI w/ DES to pLAD and DES to OM1   . Essential hypertension   . GERD (gastroesophageal reflux disease)   . Hearing loss of left ear   . History of gout   . History of hiatal hernia   . Hypercholesteremia   . Lumbar herniated disc   . MI, old   . Migraine   . OSA (obstructive sleep apnea)   . 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 Thrombocytopenia ([place])   . TIA (transient ischemic attack) ~ 2010  . Type 2 diabetes mellitus [facility])     Patient Active Problem List   Diagnosis Date Noted  . 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) ([place]) 02/19/2020  . Hepatic cirrhosis ([place]) 09/13/2019  . GERD (gastroesophageal reflux disease) 09/13/2019  . Dysphagia 09/13/2019  . DDD (degenerative disc disease), cervical 04/13/2019  . RUQ abdominal pain 11/28/2018  . Calculus of gallbladder with acute cholecystitis without obstruction   . Fatty liver   . Acute diastolic CHF (congestive heart failure) ([place]) 11/26/2018  . Right upper quadrant abdominal pain 11/26/2018  . Acute respiratory failure with hypoxia ([place])  11/26/2018  . NASH (nonalcoholic steatohepatitis)   . Pancytopenia, acquired ([place])   . Acute kidney injury ([place]) 11/20/2018  . Aphasia 07/09/2018  . Cerebral thrombosis with cerebral infarction 07/09/2018  . Chest pain 08/10/2017  . 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 Coronary artery disease involving native coronary artery with unstable angina pectoris ([place])   . CAD -S/P PCI LAD OM/LAD '13 and 2016 12/31/2015  . Hyperlipidemia   . Chest pain with high risk of acute coronary syndrome 12/23/2015  . Precordial chest pain 12/03/2014  . TIA (transient ischemic attack)   . CAD- diffuse LAD disease at cath 12/30/15   . Thrombocytopenia ([place])   . Fever 12/04/2013  . Accelerating angina ([place]) 12/03/2013  . Type 2 diabetes mellitus ([place])   . Anxiety   . PVC's (premature ventricular contractions) 01/06/2013  . Obstructive sleep apnea-declines C-pap 07/04/2012  . Mixed hyperlipidemia 07/30/2009  . Essential hypertension, benign 07/30/2009    Past Surgical History:  Procedure Laterality Date  . ANTERIOR CERVICAL DECOMP/DISCECTOMY FUSION  1998   "C3-4"  . CARDIAC CATHETERIZATION  "several"  . CARDIAC CATHETERIZATION N/A 12/30/2015   Procedure: Left Heart Cath and Coronary Angiography;  Surgeon: [name], MD;  Location: [place] [facility];  Service: Cardiovascular;  Laterality: N/A;  . CARPAL TUNNEL RELEASE Bilateral 2005  . CHOLECYSTECTOMY N/A 11/28/2018   Procedure: LAPAROSCOPIC CHOLECYSTECTOMY;  Surgeon: [name], MD;  Location: [facility];  Service: General;  Laterality: N/A;  . COLONOSCOPY    . CORONARY ANGIOPLASTY    . CORONARY ANGIOPLASTY WITH STENT PLACEMENT  2002; 2003; 11/20/2014   "I have 4 stents after today" (11/20/2014)  . CORONARY ARTERY BYPASS GRAFT N/A 01/09/2016   Procedure: CORONARY ARTERY BYPASS GRAFTING (CABG) X 3 UTILIZING RIGHT AND LEFT INTERNAL MAMMARY ARTERY AND ENDOSCOPICALLY HARVESTED SAPHENEOUS VEIN.;  Surgeon: Rexene Alberts, MD;  Location: [facility];  Service: Open  Heart Surgery;  Laterality: N/A;  . ESOPHAGOGASTRODUODENOSCOPY    . KNEE SURGERY Left 02/2012   "scraped; open"  . LEFT HEART CATH AND CORS/GRAFTS ANGIOGRAPHY N/A 08/09/2017   Procedure: LEFT HEART CATH AND CORS/GRAFTS ANGIOGRAPHY;  Surgeon: [name], MD;  Location: [place] [facility];  Service: Cardiovascular;  Laterality: N/A;  . LEFT HEART CATH AND CORS/GRAFTS ANGIOGRAPHY N/A 11/22/2018   Procedure: LEFT HEART CATH AND CORS/GRAFTS ANGIOGRAPHY;  Surgeon: [name], [name], MD;  Location: [facility];  Service: Cardiovascular;  Laterality: N/A;  . LEFT HEART CATHETERIZATION WITH CORONARY ANGIOGRAM N/A 07/20/2012   Procedure: LEFT HEART CATHETERIZATION WITH CORONARY ANGIOGRAM;  Surgeon: [name], MD;  Location: [facility];  Service: Cardiovascular;  Laterality: N/A;  . LEFT HEART CATHETERIZATION WITH CORONARY ANGIOGRAM N/A 11/20/2014   Procedure: LEFT HEART CATHETERIZATION WITH CORONARY ANGIOGRAM;  Surgeon: [name], MD;  Location: [facility] CATH LAB;  Service: Cardiovascular;  Laterality: N/A;  . LEFT HEART CATHETERIZATION WITH CORONARY ANGIOGRAM N/A 11/26/2014   Procedure: LEFT HEART CATHETERIZATION WITH CORONARY ANGIOGRAM;  Surgeon: [name], MD;  Location: [facility] CATH LAB;  Service: Cardiovascular;  Laterality: N/A;  . NEUROPLASTY / TRANSPOSITION ULNAR NERVE AT ELBOW Right ~ 2012  . PERCUTANEOUS CORONARY ROTOBLATOR INTERVENTION (PCI-R)  11/20/2014   Procedure: PERCUTANEOUS CORONARY ROTOBLATOR INTERVENTION (PCI-R);  Surgeon: [name], MD;  Location: [facility] CATH LAB;  Service: Cardiovascular;;  . SHOULDER ARTHROSCOPY Left ~ 2011  . TEE WITHOUT CARDIOVERSION N/A 01/09/2016   Procedure: TRANSESOPHAGEAL ECHOCARDIOGRAM (TEE);  Surgeon: Rexene Alberts, MD;  Location: [facility];  Service: Open Heart Surgery;  Laterality: N/A;       Family History  Problem Relation Age of Onset  . Stroke Mother   . Coronary artery disease Father 54  . Asthma Sister   . Multiple sclerosis Brother    . Heart disease Paternal Uncle   . Stroke Maternal Grandmother   . Heart attack Paternal Grandmother   . Cancer Paternal Grandfather   . Asthma Sister   . Seizures Son   . Migraines Son   . Autism Son   . Migraines Son     Social History   Tobacco Use  . Smoking status: Never Smoker  . Smokeless tobacco: Never Used  Substance Use Topics  . Alcohol use: No  . Drug use: No    Home Medications Prior to Admission medications   Medication Sig Start Date End Date Taking? Authorizing Provider  albuterol (PROVENTIL HFA;VENTOLIN HFA) 108 (90 BASE) MCG/ACT inhaler Inhale 2 puffs into the lungs every 6 (six) hours as needed for wheezing or shortness of breath.    [provider]  amLODipine (NORVASC) 5 MG tablet Take 5 mg by mouth at bedtime. 06/16/18   [provider]  aspirin EC 81 MG tablet Take 81  mg by mouth at bedtime.     [provider]  citalopram (CELEXA) 20 MG tablet Take 20 mg by mouth at bedtime.     [provider]  clopidogrel (PLAVIX) 75 MG tablet Take 1 tablet (75 mg total) by mouth daily for 14 days. 02/21/20 03/06/20  Manuella Ghazi, Pratik D, DO  diazepam (VALIUM) 5 MG tablet Take 5 mg by mouth at bedtime. *May take one tablet up to three times daily as needed for anxiety    [provider]  doxycycline (VIBRAMYCIN) 100 MG capsule Take 1 capsule (100 mg total) by mouth 2 (two) times daily. One po bid x 7 days 03/06/20   Elnora Morrison, MD  ergocalciferol (VITAMIN D2) 1.25 MG (50000 UT) capsule Take 1 capsule (50,000 Units total) by mouth once a week. 05/03/19   Lockamy, Randi L, NP-C  ezetimibe (ZETIA) 10 MG tablet Take 10 mg by mouth daily.    [provider]  ferrous sulfate 325 (65 FE) MG EC tablet Take 325 mg by mouth once a week.     [provider]  fluticasone (FLONASE) 50 MCG/ACT nasal spray Place 1 spray into the nose 2 (two) times daily.     [provider]  Fluticasone-Salmeterol (ADVAIR) 250-50 MCG/DOSE  AEPB Inhale 1 puff into the lungs every 12 (twelve) hours.    [provider]  furosemide (LASIX) 20 MG tablet Take 20 mg by mouth daily. 10/14/19   [provider]  gabapentin (NEURONTIN) 300 MG capsule Take 300 mg by mouth 3 (three) times daily. 07/09/17   [provider]  insulin aspart (NOVOLOG FLEXPEN) 100 UNIT/ML FlexPen Inject 2-30 Units into the skin 3 (three) times daily as needed for high blood sugar (CBG >150). Per sliding scale written down at home     [provider]  isosorbide mononitrate (IMDUR) 30 MG 24 hr tablet Take 30 mg by mouth at bedtime. 06/16/18   [provider]  Melatonin 5 MG TABS Take 10 mg by mouth at bedtime.     [provider]  metFORMIN (GLUCOPHAGE) 1000 MG tablet Take 1 tablet (1,000 mg total) by mouth 2 (two) times daily. For the next 2 days as you did receive IV contrast, then resume on 11/26/2018 Patient taking differently: Take 1,000 mg by mouth 2 (two) times daily.  11/23/18   Elgergawy, Silver Huguenin, MD  metoprolol tartrate (LOPRESSOR) 50 MG tablet Take 50 mg by mouth 2 (two) times daily.     [provider]  nitroGLYCERIN (NITROSTAT) 0.4 MG SL tablet Place 1 tablet (0.4 mg total) under the tongue every 5 (five) minutes x 3 doses as needed for chest pain (if no relief after 3rd dose, proceed to the ED for an evaluation or call 911). 04/10/19 02/19/20  [name], MD  omeprazole (PRILOSEC) 40 MG capsule Take 40 mg by mouth daily.     [provider]  pravastatin (PRAVACHOL) 20 MG tablet Take 1 tablet (20 mg total) by mouth daily. 02/20/20 03/21/20  Manuella Ghazi, Pratik D, DO  tiZANidine (ZANAFLEX) 4 MG tablet Take 4 mg by mouth every 6 (six) hours as needed for muscle spasms.  02/12/20   [provider]  topiramate (TOPAMAX) 25 MG capsule Take 50 mg by mouth 2 (two) times daily.     [provider]  TRESIBA FLEXTOUCH 100 UNIT/ML SOPN FlexTouch Pen Inject 70 Units into the skin at bedtime.   08/11/18   [provider]  vitamin B-12 (CYANOCOBALAMIN)  1000 MCG tablet Take 1 tablet (1,000 mcg total) by mouth daily. 02/20/20 03/21/20  Manuella Ghazi, Pratik D, DO    Allergies    Divalproex sodium, Statins, Tramadol, Valproic acid, Gadolinium derivatives, and Tricor [fenofibrate]  Review of Systems   Review of Systems  Constitutional: Positive for appetite change and fever. Negative for chills.  HENT: Positive for congestion.   Eyes: Negative for visual disturbance.  Respiratory: Positive for cough. Negative for shortness of breath.   Cardiovascular: Negative for chest pain.  Gastrointestinal: Negative for abdominal pain and vomiting.  Genitourinary: Negative for dysuria and flank pain.  Musculoskeletal: Negative for back pain, neck pain and neck stiffness.  Skin: Negative for rash.  Neurological: Negative for light-headedness and headaches.    Physical Exam Updated Vital Signs BP 134/66   Pulse 85   Temp 99.9 F (37.7 C) (Oral)   Resp (!) 26   Ht 5' 9"  (1.753 m)   Wt 108.9 kg   SpO2 90%   BMI 35.44 kg/m   Physical Exam Vitals and nursing note reviewed.  Constitutional:      Appearance: He is well-developed.  HENT:     Head: Normocephalic and atraumatic.     Nose: Congestion present.  Eyes:     General:        Right eye: No discharge.        Left eye: No discharge.     Conjunctiva/sclera: Conjunctivae normal.  Neck:     Trachea: No tracheal deviation.  Cardiovascular:     Rate and Rhythm: Normal rate and regular rhythm.  Pulmonary:     Effort: Pulmonary effort is normal.     Breath sounds: Normal breath sounds.  Abdominal:     General: There is no distension.     Palpations: Abdomen is soft.     Tenderness: There is no abdominal tenderness. There is no guarding.  Musculoskeletal:        General: No swelling.     Cervical back: Normal range of motion and neck supple.  Skin:    General: Skin is warm.     Findings: No rash.  Neurological:     General:  No focal deficit present.     Mental Status: He is alert and oriented to person, place, and time.     ED Results / Procedures / Treatments   Labs (all labs ordered are listed, but only abnormal results are displayed) Labs Reviewed  CBC WITH DIFFERENTIAL/PLATELET - Abnormal; Notable for the following components:      Result Value   Platelets 72 (*)    All other components within normal limits  BASIC METABOLIC PANEL - Abnormal; Notable for the following components:   Glucose, Bld 233 (*)    All other components within normal limits  HEPATIC FUNCTION PANEL - Abnormal; Notable for the following components:   Bilirubin, Direct 0.3 (*)    All other components within normal limits  URINALYSIS, ROUTINE W REFLEX MICROSCOPIC - Abnormal; Notable for the following components:   Glucose, UA 50 (*)    Hgb urine dipstick SMALL (*)    Protein, ur 30 (*)    All other components within normal limits  RESPIRATORY PANEL BY RT PCR (FLU A&B, COVID)  AMMONIA    EKG None  Radiology CT Head Wo Contrast  Result Date: 03/06/2020 CLINICAL DATA:  Patient with cough, fevers, chills and body aches. EXAM: CT HEAD WITHOUT CONTRAST TECHNIQUE: Contiguous axial images were obtained from the base of the skull through  the vertex without intravenous contrast. COMPARISON:  Brain CT 02/19/2020 FINDINGS: Brain: Ventricles and sulci are appropriate for patient's age. No evidence for acute cortically based infarct, intracranial hemorrhage, mass lesion or mass-effect. Vascular: Unremarkable Skull: Intact. Sinuses/Orbits: Mucosal thickening involving the ethmoid air cells. Orbits are unremarkable. Other: None. IMPRESSION: No acute intracranial process. Electronically Signed   By: Lovey Newcomer M.D.   On: 03/06/2020 13:55   DG Chest Portable 1 View  Result Date: 03/06/2020 CLINICAL DATA:  Cough, fever. EXAM: PORTABLE CHEST 1 VIEW COMPARISON:  February 19, 2020. FINDINGS: The heart size and mediastinal contours are within normal  limits. Both lungs are clear. Status post coronary bypass graft. The visualized skeletal structures are unremarkable. IMPRESSION: No active disease. Electronically Signed   By: Marijo Conception M.D.   On: 03/06/2020 10:28    Procedures Procedures (including critical care time)  Medications Ordered in ED Medications  acetaminophen (TYLENOL) tablet 1,000 mg (1,000 mg Oral Given 03/06/20 1155)    ED Course  I have reviewed the triage vital signs and the nursing notes.  Pertinent labs & imaging results that were available during my care of the patient were reviewed by me and considered in my medical decision making (see chart for details).    MDM Rules/Calculators/A&P                     Patient presents with clinical concern for respiratory infection/Covid/bacterial infection.  Patient has normal work of breathing, lungs overall clear, not requiring oxygen.  With patient's risk factors blood work was obtained and chest x-ray which was reviewed and was overall clear.  Patient stable for close outpatient follow-up.  Covid testing negative. Blood work reviewed normal kidney function, platelets 72 patient has been low in the past.  Normal white blood cell count.  Normal hemoglobin.  Patient's family member arrived and did comment last night he was little confused for example playing a game on his phone that is not there.  Patient currently is not confused this may be related to infection/fever he had.  Patient observed in the ER, additional ammonia, LFTs and CT head added no acute abnormalities. Plan to cover for atypical pneumonia with very close outpatient follow-up and strict reasons to return given.  Patient ambulated with oxygen in the 90s, 96% with good waveform prior to discharge when I was in the room.  [name] [name] was evaluated in Emergency Department on 03/06/2020 for the symptoms described in the history of present illness. He was evaluated in the context of the global COVID-19  pandemic, which necessitated consideration that the patient might be at risk for infection with the SARS-CoV-2 virus that causes COVID-19. Institutional protocols and algorithms that pertain to the evaluation of patients at risk for COVID-19 are in a state of rapid change based on information released by regulatory bodies including the CDC and federal and state organizations. These policies and algorithms were followed during the patient's care in the ED.  Final Clinical Impression(s) / ED Diagnoses Final diagnoses:  Flu-like symptoms  Cough in adult  Thrombocytopenia ([place])  Confusion    Rx / DC Orders ED Discharge Orders         Ordered    doxycycline (VIBRAMYCIN) 100 MG capsule  2 times daily     03/06/20 1445           Elnora Morrison, MD 03/06/20 1154    Elnora Morrison, MD 03/06/20 1447

## 2020-03-12 ENCOUNTER — Inpatient Hospital Stay (HOSPITAL_COMMUNITY): Payer: Medicare Other | Attending: Hematology

## 2020-03-12 DIAGNOSIS — E559 Vitamin D deficiency, unspecified: Secondary | ICD-10-CM | POA: Insufficient documentation

## 2020-03-12 DIAGNOSIS — E538 Deficiency of other specified B group vitamins: Secondary | ICD-10-CM | POA: Insufficient documentation

## 2020-03-12 DIAGNOSIS — E611 Iron deficiency: Secondary | ICD-10-CM | POA: Insufficient documentation

## 2020-03-12 DIAGNOSIS — D696 Thrombocytopenia, unspecified: Secondary | ICD-10-CM | POA: Insufficient documentation

## 2020-03-19 ENCOUNTER — Other Ambulatory Visit: Payer: Self-pay

## 2020-03-19 ENCOUNTER — Ambulatory Visit (HOSPITAL_COMMUNITY): Payer: Medicare Other | Admitting: Nurse Practitioner

## 2020-03-19 ENCOUNTER — Inpatient Hospital Stay (HOSPITAL_COMMUNITY): Payer: Medicare Other

## 2020-03-19 DIAGNOSIS — E559 Vitamin D deficiency, unspecified: Secondary | ICD-10-CM | POA: Diagnosis not present

## 2020-03-19 DIAGNOSIS — D696 Thrombocytopenia, unspecified: Secondary | ICD-10-CM

## 2020-03-19 DIAGNOSIS — E538 Deficiency of other specified B group vitamins: Secondary | ICD-10-CM | POA: Diagnosis not present

## 2020-03-19 DIAGNOSIS — E611 Iron deficiency: Secondary | ICD-10-CM | POA: Diagnosis not present

## 2020-03-19 LAB — COMPREHENSIVE METABOLIC PANEL
ALT: 42 U/L (ref 0–44)
AST: 46 U/L — ABNORMAL HIGH (ref 15–41)
Albumin: 3.8 g/dL (ref 3.5–5.0)
Alkaline Phosphatase: 44 U/L (ref 38–126)
Anion gap: 9 (ref 5–15)
BUN: 18 mg/dL (ref 8–23)
CO2: 25 mmol/L (ref 22–32)
Calcium: 8.8 mg/dL — ABNORMAL LOW (ref 8.9–10.3)
Chloride: 103 mmol/L (ref 98–111)
Creatinine, Ser: 1.06 mg/dL (ref 0.61–1.24)
GFR calc Af Amer: >60 mL/min (ref >60)
GFR calc non Af Amer: >60 mL/min (ref >60)
Glucose, Bld: 193 mg/dL — ABNORMAL HIGH (ref 70–99)
Potassium: 4.2 mmol/L (ref 3.5–5.1)
Sodium: 137 mmol/L (ref 135–145)
Total Bilirubin: 0.8 mg/dL (ref 0.3–1.2)
Total Protein: 7.6 g/dL (ref 6.5–8.1)

## 2020-03-19 LAB — CBC WITH DIFFERENTIAL/PLATELET
Abs Immature Granulocytes: 0.02 10*3/uL (ref 0.00–0.07)
Basophils Absolute: 0 10*3/uL (ref 0.0–0.1)
Basophils Relative: 0 %
Eosinophils Absolute: 0.1 10*3/uL (ref 0.0–0.5)
Eosinophils Relative: 2 %
HCT: 46.8 % (ref 39.0–52.0)
Hemoglobin: 15.8 g/dL (ref 13.0–17.0)
Immature Granulocytes: 1 %
Lymphocytes Relative: 31 %
Lymphs Abs: 1 10*3/uL (ref 0.7–4.0)
MCH: 30.9 pg (ref 26.0–34.0)
MCHC: 33.8 g/dL (ref 30.0–36.0)
MCV: 91.4 fL (ref 80.0–100.0)
Monocytes Absolute: 0.2 10*3/uL (ref 0.1–1.0)
Monocytes Relative: 7 %
Neutro Abs: 2 10*3/uL (ref 1.7–7.7)
Neutrophils Relative %: 59 %
Platelets: 88 10*3/uL — ABNORMAL LOW (ref 150–400)
RBC: 5.12 MIL/uL (ref 4.22–5.81)
RDW: 13.4 % (ref 11.5–15.5)
WBC: 3.3 10*3/uL — ABNORMAL LOW (ref 4.0–10.5)
nRBC: 0 % (ref 0.0–0.2)

## 2020-03-19 LAB — FERRITIN: Ferritin: 139 ng/mL (ref 24–336)

## 2020-03-19 LAB — VITAMIN D 25 HYDROXY (VIT D DEFICIENCY, FRACTURES): Vit D, 25-Hydroxy: 27.84 ng/mL — ABNORMAL LOW (ref 30–100)

## 2020-03-19 LAB — VITAMIN B12: Vitamin B-12: 3285 pg/mL — ABNORMAL HIGH (ref 180–914)

## 2020-03-19 LAB — IRON AND TIBC
Iron: 114 ug/dL (ref 45–182)
Saturation Ratios: 34 % (ref 17.9–39.5)
TIBC: 340 ug/dL (ref 250–450)
UIBC: 226 ug/dL

## 2020-03-26 ENCOUNTER — Ambulatory Visit: Payer: Medicare Other | Admitting: Neurology

## 2020-03-28 ENCOUNTER — Ambulatory Visit: Payer: Self-pay | Admitting: Neurology

## 2020-03-28 ENCOUNTER — Ambulatory Visit (HOSPITAL_COMMUNITY): Payer: Medicare Other | Admitting: Nurse Practitioner

## 2020-03-28 NOTE — Progress Notes (Deleted)
[place] [place], [place] [address]   CLINIC:  Medical Oncology/Hematology  PCP:  Terrial Rhodes, [place] [address] [phone]   REASON FOR VISIT: Follow-up for mild thrombocytopenia    CURRENT THERAPY: Observation   INTERVAL HISTORY:  [name] 64 y.o. male returns for routine follow-up for mild thrombocytopenia. Denies any nausea, vomiting, or diarrhea. Denies any new pains. Had not noticed any recent bleeding such as epistaxis, hematuria or hematochezia. Denies recent chest pain on exertion, shortness of breath on minimal exertion, pre-syncopal episodes, or palpitations. Denies any numbness or tingling in hands or feet. Denies any recent fevers, infections, or recent hospitalizations. Patient reports appetite at ***% and energy level at ***%.    REVIEW OF SYSTEMS:  Review of Systems  All other systems reviewed and are negative.    PAST MEDICAL/SURGICAL HISTORY:  Past Medical History:  Diagnosis Date  . Anxiety   . Arthritis   . Asthma   . Cataract    Right eye  . Chronic lower back pain   . Cirrhosis of liver ([place])   . Coronary atherosclerosis of native coronary artery    a. LAD & OM stenting;  b. 2007 Cath: nonobs dzs, patent stents;  c. 07/2012 neg MV, EF 61%.  d. 11/20/14: [place] s/p  PCI w/ DES to pLAD and DES to OM1   . Essential hypertension   . GERD (gastroesophageal reflux disease)   . Hearing loss of left ear   . History of gout   . History of hiatal hernia   . Hypercholesteremia   . Lumbar herniated disc   . MI, old   . Migraine   . OSA (obstructive sleep apnea)   . 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 Thrombocytopenia ([place])   . TIA (transient ischemic attack) ~ 2010  . Type 2 diabetes mellitus ([place])    Past Surgical History:  Procedure Laterality Date  . ANTERIOR CERVICAL DECOMP/DISCECTOMY FUSION  1998   "C3-4"  .  CARDIAC CATHETERIZATION  "several"  . CARDIAC CATHETERIZATION N/A 12/30/2015   Procedure: Left Heart Cath and Coronary Angiography;  Surgeon: [name], MD;  Location: [facility];  Service: Cardiovascular;  Laterality: N/A;  . CARPAL TUNNEL RELEASE Bilateral 2005  . CHOLECYSTECTOMY N/A 11/28/2018   Procedure: LAPAROSCOPIC CHOLECYSTECTOMY;  Surgeon: [name], MD;  Location: [facility];  Service: General;  Laterality: N/A;  . COLONOSCOPY    . CORONARY ANGIOPLASTY    . CORONARY ANGIOPLASTY WITH STENT PLACEMENT  2002; 2003; 11/20/2014   "I have 4 stents after today" (11/20/2014)  . CORONARY ARTERY BYPASS GRAFT N/A 01/09/2016   Procedure: CORONARY ARTERY BYPASS GRAFTING (CABG) X 3 UTILIZING RIGHT AND LEFT INTERNAL MAMMARY ARTERY AND ENDOSCOPICALLY HARVESTED SAPHENEOUS VEIN.;  Surgeon: Rexene Alberts, MD;  Location: [facility];  Service: Open Heart Surgery;  Laterality: N/A;  . ESOPHAGOGASTRODUODENOSCOPY    . KNEE SURGERY Left 02/2012   "scraped; open"  . LEFT HEART CATH AND CORS/GRAFTS ANGIOGRAPHY N/A 08/09/2017   Procedure: LEFT HEART CATH AND CORS/GRAFTS ANGIOGRAPHY;  Surgeon: [name], MD;  Location: [facility];  Service: Cardiovascular;  Laterality: N/A;  . LEFT HEART CATH AND CORS/GRAFTS ANGIOGRAPHY N/A 11/22/2018   Procedure: LEFT HEART CATH AND CORS/GRAFTS ANGIOGRAPHY;  Surgeon: [name], [name], MD;  Location: [facility];  Service: Cardiovascular;  Laterality: N/A;  . LEFT HEART CATHETERIZATION WITH CORONARY ANGIOGRAM N/A 07/20/2012   Procedure: LEFT HEART CATHETERIZATION WITH CORONARY ANGIOGRAM;  Surgeon: [name], MD;  Location: [facility];  Service: Cardiovascular;  Laterality: N/A;  . LEFT HEART CATHETERIZATION WITH CORONARY ANGIOGRAM N/A 11/20/2014   Procedure: LEFT HEART CATHETERIZATION WITH CORONARY ANGIOGRAM;  Surgeon: [name], MD;  Location: [facility] C[facility];  Service: Cardiovascular;  Laterality: N/A;  . LEFT HEART CATHETERIZATION WITH  CORONARY ANGIOGRAM N/A 11/26/2014   Procedure: LEFT HEART CATHETERIZATION WITH CORONARY ANGIOGRAM;  Surgeon: [name], MD;  Location: [facility];  Service: Cardiovascular;  Laterality: N/A;  . NEUROPLASTY / TRANSPOSITION ULNAR NERVE AT ELBOW Right ~ 2012  . PERCUTANEOUS CORONARY ROTOBLATOR INTERVENTION (PCI-R)  11/20/2014   Procedure: PERCUTANEOUS CORONARY ROTOBLATOR INTERVENTION (PCI-R);  Surgeon: [name], MD;  Location: [facility] CATH LAB;  Service: Cardiovascular;;  . SHOULDER ARTHROSCOPY Left ~ 2011  . TEE WITHOUT CARDIOVERSION N/A 01/09/2016   Procedure: TRANSESOPHAGEAL ECHOCARDIOGRAM (TEE);  Surgeon: Rexene Alberts, MD;  Location: [facility];  Service: Open Heart Surgery;  Laterality: N/A;     SOCIAL HISTORY:  Social History   Socioeconomic History  . Marital status: Married    Spouse name: [name]  . Number of children: 3  . Years of education: 56 th   . Highest education level: Not on file  Occupational History  . Occupation: Unemployed  Tobacco Use  . Smoking status: Never Smoker  . Smokeless tobacco: Never Used  Substance and Sexual Activity  . Alcohol use: No  . Drug use: No  . Sexual activity: Not on file  Other Topics Concern  . Not on file  Social History Narrative   Patient lives at home with wife [name].   Patient works at [employer], Engineer, petroleum.   Patient has a 12 th grade education.    Patient has 3 children.       Social Determinants of Health   Financial Resource Strain:   . Difficulty of Paying Living Expenses:   Food Insecurity:   . Worried About Charity fundraiser in the Last Year:   . Arboriculturist in the Last Year:   Transportation Needs:   . Film/video editor (Medical):   [name] Lack of Transportation (Non-Medical):   Physical Activity:   . Days of Exercise per Week:   . Minutes of Exercise per Session:   Stress:   . Feeling of Stress :   Social Connections:   . Frequency of Communication with Friends and Family:   .  Frequency of Social Gatherings with Friends and Family:   . Attends Religious Services:   . Active Member of Clubs or Organizations:   . Attends Archivist Meetings:   [name] Marital Status:   Intimate Partner Violence:   . Fear of Current or Ex-Partner:   . Emotionally Abused:   [name] Physically Abused:   . Sexually Abused:     FAMILY HISTORY:  Family History  Problem Relation Age of Onset  . Stroke Mother   . Coronary artery disease Father 68  . Asthma Sister   . Multiple sclerosis Brother   . Heart disease Paternal Uncle   . Stroke Maternal Grandmother   . Heart attack Paternal Grandmother   . Cancer Paternal Grandfather   . Asthma Sister   . Seizures Son   . Migraines Son   . Autism Son   .  Migraines Son     CURRENT MEDICATIONS:  Outpatient Encounter Medications as of 03/28/2020  Medication Sig  . albuterol (PROVENTIL HFA;VENTOLIN HFA) 108 (90 BASE) MCG/ACT inhaler Inhale 2 puffs into the lungs every 6 (six) hours as needed for wheezing or shortness of breath.  [name] amLODipine (NORVASC) 5 MG tablet Take 5 mg by mouth at bedtime.  [name] aspirin EC 81 MG tablet Take 81 mg by mouth at bedtime.   . citalopram (CELEXA) 20 MG tablet Take 20 mg by mouth at bedtime.   . diazepam (VALIUM) 5 MG tablet Take 5 mg by mouth at bedtime. *May take one tablet up to three times daily as needed for anxiety  . doxycycline (VIBRAMYCIN) 100 MG capsule Take 1 capsule (100 mg total) by mouth 2 (two) times daily. One po bid x 7 days  . ergocalciferol (VITAMIN D2) 1.25 MG (50000 UT) capsule Take 1 capsule (50,000 Units total) by mouth once a week.  . ezetimibe (ZETIA) 10 MG tablet Take 10 mg by mouth daily.  . ferrous sulfate 325 (65 FE) MG EC tablet Take 325 mg by mouth once a week.   . fluticasone (FLONASE) 50 MCG/ACT nasal spray Place 1 spray into the nose 2 (two) times daily.   . Fluticasone-Salmeterol (ADVAIR) 250-50 MCG/DOSE AEPB Inhale 1 puff into the lungs every 12 (twelve) hours.  .  furosemide (LASIX) 20 MG tablet Take 20 mg by mouth daily.  [name] gabapentin (NEURONTIN) 300 MG capsule Take 300 mg by mouth 3 (three) times daily.  . insulin aspart (NOVOLOG FLEXPEN) 100 UNIT/ML FlexPen Inject 2-30 Units into the skin 3 (three) times daily as needed for high blood sugar (CBG >150). Per sliding scale written down at home   . isosorbide mononitrate (IMDUR) 30 MG 24 hr tablet Take 30 mg by mouth at bedtime.  . Melatonin 5 MG TABS Take 10 mg by mouth at bedtime.   . metFORMIN (GLUCOPHAGE) 1000 MG tablet Take 1 tablet (1,000 mg total) by mouth 2 (two) times daily. For the next 2 days as you did receive IV contrast, then resume on 11/26/2018 (Patient taking differently: Take 1,000 mg by mouth 2 (two) times daily. )  . metoprolol tartrate (LOPRESSOR) 50 MG tablet Take 50 mg by mouth 2 (two) times daily.   . nitroGLYCERIN (NITROSTAT) 0.4 MG SL tablet Place 1 tablet (0.4 mg total) under the tongue every 5 (five) minutes x 3 doses as needed for chest pain (if no relief after 3rd dose, proceed to the ED for an evaluation or call 911).  [name] omeprazole (PRILOSEC) 40 MG capsule Take 40 mg by mouth daily.   . pravastatin (PRAVACHOL) 20 MG tablet Take 1 tablet (20 mg total) by mouth daily.  [name] tiZANidine (ZANAFLEX) 4 MG tablet Take 4 mg by mouth every 6 (six) hours as needed for muscle spasms.   [name] topiramate (TOPAMAX) 25 MG capsule Take 50 mg by mouth 2 (two) times daily.   [name] FLEXTOUCH 100 UNIT/ML SOPN FlexTouch Pen Inject 70 Units into the skin at bedtime.    No facility-administered encounter medications on file as of 03/28/2020.    ALLERGIES:  Allergies  Allergen Reactions  . Divalproex Sodium Other (See Comments)    Causes anger  . Statins Other (See Comments)    Muscle aches and cramps  . Tramadol Other (See Comments)    Chest pain   . Valproic Acid Other (See Comments)    "anger"  . Gadolinium Derivatives Nausea And Vomiting  07/25/19 Pt vomited immediately after IV gad. Denies  itching, dyspnea.  (Adverse, not allergic, reaction  . Tricor [Fenofibrate] Other (See Comments)    Leg cramps     PHYSICAL EXAM:  ECOG Performance status: 1  There were no vitals filed for this visit. There were no vitals filed for this visit.    Physical Exam Constitutional:      Appearance: Normal appearance. He is normal weight.  Cardiovascular:     Rate and Rhythm: Normal rate and regular rhythm.     Heart sounds: Normal heart sounds.  Pulmonary:     Effort: Pulmonary effort is normal.     Breath sounds: Normal breath sounds.  Abdominal:     General: Bowel sounds are normal.     Palpations: Abdomen is soft.  Musculoskeletal:        General: Normal range of motion.  Skin:    General: Skin is warm.  Neurological:     Mental Status: He is alert and oriented to person, place, and time. Mental status is at baseline.  Psychiatric:        Mood and Affect: Mood normal.        Behavior: Behavior normal.        Thought Content: Thought content normal.        Judgment: Judgment normal.      LABORATORY DATA:  I have reviewed the labs as listed.  CBC    Component Value Date/Time   WBC 3.3 (L) 03/19/2020 1315   RBC 5.12 03/19/2020 1315   HGB 15.8 03/19/2020 1315   HCT 46.8 03/19/2020 1315   PLT 88 (L) 03/19/2020 1315   MCV 91.4 03/19/2020 1315   MCH 30.9 03/19/2020 1315   MCHC 33.8 03/19/2020 1315   RDW 13.4 03/19/2020 1315   LYMPHSABS 1.0 03/19/2020 1315   MONOABS 0.2 03/19/2020 1315   EOSABS 0.1 03/19/2020 1315   BASOSABS 0.0 03/19/2020 1315   CMP Latest Ref Rng & Units 03/19/2020 03/06/2020 02/20/2020  Glucose 70 - 99 mg/dL 193(H) 233(H) 121(H)  BUN 8 - 23 mg/dL 18 16 14   Creatinine 0.61 - 1.24 mg/dL 1.06 1.19 0.97  Sodium 135 - 145 mmol/L 137 136 142  Potassium 3.5 - 5.1 mmol/L 4.2 3.7 3.9  Chloride 98 - 111 mmol/L 103 102 110  CO2 22 - 32 mmol/L 25 22 23   Calcium 8.9 - 10.3 mg/dL 8.8(L) 8.9 9.0  Total Protein 6.5 - 8.1 g/dL 7.6 7.3 6.8  Total Bilirubin  0.3 - 1.2 mg/dL 0.8 1.0 0.6  Alkaline Phos 38 - 126 U/L 44 43 42  AST 15 - 41 U/L 46(H) 36 38  ALT 0 - 44 U/L 42 36 40    All questions were answered to patient's stated satisfaction. Encouraged patient to call with any new concerns or questions before his next visit to the cancer center and we can certain see him sooner, if needed.     ASSESSMENT & PLAN:  Thrombocytopenia ([place]) 1.  Thrombocytopenia: -He has mild thrombocytopenia since 2009. -Ultrasound of the abdomen on 11/22/2018 showed borderline splenomegaly, 12.3 cm, but elevated volume at 517 mL's. -CT scan of the abdomen and pelvis on 11/23/2018 showed hepatic cirrhosis.  Spleen size is normal without focal abnormality.  Hepatitis panel was negative. -Nutritional work-up included serum copper, folic acid, and G18 were all within normal limits.  SPEP was negative.  H. pylori antibodies were negative. -ANA was negative.  However rheumatoid factor was strongly positive at 68.9. - Patient  does report arthritis of small joints in hands and feet.  He also has hip pains.  He was referred to a rheumatologist last visit. - Labs on 04/27/2019 showed his platelet count 114 - He will follow-up in 4 months with repeat labs  2.  Normocytic anemia: - He was found to have normocytic anemia during a recent hospital admission.  This is likely postsurgical. - He was placed on oral iron therapy with a stool softener. - He reports stool sample was brought in for occult blood testing however the results are not in the computer. -He was referred to Dr. Laural Golden.  His last colonoscopy was greater than 5 years ago in [place] on 04/27/2019 showed his hemoglobin is stable at 15.3.  Vitamin B12 was 229. -We recommended him take oral vitamin B12 daily. -He will follow-up in 4 months with repeat labs  3.  Vitamin D deficiency: - Vitamin D was initially severely low at 13.2. -He never received the prescription for his vitamin D last visit.  So  I called him in a new prescription for vitamin D 50,000 units weekly.  Sent to [facility] drug. - Labs on 04/27/2019 showed his vitamin D level at 21.3 -He will continue taking his vitamin D  -We will check at his next visit  4.  Cirrhosis: - Cholecystectomy and liver biopsy on 11/28/2018 showed cirrhosis. -He is following up with Dr. Laural Golden. - He reports his visit with Dr. Laural Golden was postponed due to [place].  However he has appointment with Delsa Grana NP in November.     Orders placed this encounter:  No orders of the defined types were placed in this encounter.     Francene Finders, FNP-C [place] [phone]

## 2020-03-28 NOTE — Assessment & Plan Note (Deleted)
1.  Thrombocytopenia: -He has mild thrombocytopenia since 2009. -Ultrasound of the abdomen on 11/22/2018 showed borderline splenomegaly, 12.3 cm, but elevated volume at 517 mL's. -CT scan of the abdomen and pelvis on 11/23/2018 showed hepatic cirrhosis.  Spleen size is normal without focal abnormality.  Hepatitis panel was negative. -Nutritional work-up included serum copper, folic acid, and D35 were all within normal limits.  SPEP was negative.  H. pylori antibodies were negative. -ANA was negative.  However rheumatoid factor was strongly positive at 68.9. - Patient does report arthritis of small joints in hands and feet.  He also has hip pains.  He was referred to a rheumatologist last visit. - Labs on 04/27/2019 showed his platelet count 114 - He will follow-up in 4 months with repeat labs  2.  Normocytic anemia: - He was found to have normocytic anemia during a recent hospital admission.  This is likely postsurgical. - He was placed on oral iron therapy with a stool softener. - He reports stool sample was brought in for occult blood testing however the results are not in the computer. -He was referred to Dr. Laural Golden.  His last colonoscopy was greater than 5 years ago in [place] on 04/27/2019 showed his hemoglobin is stable at 15.3.  Vitamin B12 was 229. -We recommended him take oral vitamin B12 daily. -He will follow-up in 4 months with repeat labs  3.  Vitamin D deficiency: - Vitamin D was initially severely low at 13.2. -He never received the prescription for his vitamin D last visit.  So I called him in a new prescription for vitamin D 50,000 units weekly.  Sent to [facility] drug. - Labs on 04/27/2019 showed his vitamin D level at 21.3 -He will continue taking his vitamin D  -We will check at his next visit  4.  Cirrhosis: - Cholecystectomy and liver biopsy on 11/28/2018 showed cirrhosis. -He is following up with Dr. Laural Golden. - He reports his visit with Dr. Laural Golden was  postponed due to [place].  However he has appointment with Delsa Grana NP in November.

## 2020-04-19 ENCOUNTER — Other Ambulatory Visit: Payer: Self-pay

## 2020-04-19 ENCOUNTER — Encounter: Payer: Self-pay | Admitting: Cardiology

## 2020-04-19 ENCOUNTER — Ambulatory Visit (INDEPENDENT_AMBULATORY_CARE_PROVIDER_SITE_OTHER): Payer: Medicare Other | Admitting: Cardiology

## 2020-04-19 VITALS — BP 100/58 | HR 60 | Ht 69.0 in | Wt 229.2 lb

## 2020-04-19 DIAGNOSIS — R2990 Unspecified symptoms and signs involving the nervous system: Secondary | ICD-10-CM

## 2020-04-19 DIAGNOSIS — I25119 Atherosclerotic heart disease of native coronary artery with unspecified angina pectoris: Secondary | ICD-10-CM

## 2020-04-19 DIAGNOSIS — I10 Essential (primary) hypertension: Secondary | ICD-10-CM

## 2020-04-19 DIAGNOSIS — E782 Mixed hyperlipidemia: Secondary | ICD-10-CM | POA: Diagnosis not present

## 2020-04-19 NOTE — Patient Instructions (Addendum)
Medication Instructions:   Your physician recommends that you continue on your current medications as directed. Please refer to the Current Medication list given to you today.  Labwork:  NONE  Testing/Procedures:   NONE  Follow-Up:  Your physician recommends that you schedule a follow-up appointment in: 6 months (office).  Any Other Special Instructions Will Be Listed Below (If Applicable).  If you need a refill on your cardiac medications before your next appointment, please call your pharmacy.

## 2020-04-19 NOTE — Progress Notes (Signed)
Cardiology Office Note  Date: 04/19/2020   ID: [name] [name], [name] [name] 02/18/1956, MRN [id number]  PCP:  Terrial Rhodes, MD  Cardiologist:  Rozann Lesches, MD Electrophysiologist:  None   Chief Complaint  Patient presents with  . Cardiac follow-up    History of Present Illness: [name] [name] is a 64 y.o. male last assessed via telehealth encounter in December 2020.  He presents for a routine visit.  Since last encounter he states that he has done well overall from a cardiac perspective, no obvious angina symptoms or nitroglycerin use.  I reviewed his recent lab work and ECG as outlined below.  We went over his medications today, listed below.  He reports no changes or intolerances.  Past Medical History:  Diagnosis Date  . Anxiety   . Arthritis   . Asthma   . Cataract    Right eye  . Chronic lower back pain   . Cirrhosis of liver ([place])   . Coronary atherosclerosis of native coronary artery    a. LAD & OM stenting;  b. 2007 Cath: nonobs dzs, patent stents;  c. 07/2012 neg MV, EF 61%.  d. 11/20/14: [place] s/p  PCI w/ DES to pLAD and DES to OM1   . Essential hypertension   . GERD (gastroesophageal reflux disease)   . Hearing loss of left ear   . History of gout   . History of hiatal hernia   . Hypercholesteremia   . Lumbar herniated disc   . MI, old   . Migraine   . OSA (obstructive sleep apnea)   . 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 Thrombocytopenia ([place])   . TIA (transient ischemic attack) ~ 2010  . Type 2 diabetes mellitus ([place])     Past Surgical History:  Procedure Laterality Date  . ANTERIOR CERVICAL DECOMP/DISCECTOMY FUSION  1998   "C3-4"  . CARDIAC CATHETERIZATION  "several"  . CARDIAC CATHETERIZATION N/A 12/30/2015   Procedure: Left Heart Cath and Coronary Angiography;  Surgeon: [name], MD;  Location: [place] [facility];  Service: Cardiovascular;  Laterality: N/A;  . CARPAL  TUNNEL RELEASE Bilateral 2005  . CHOLECYSTECTOMY N/A 11/28/2018   Procedure: LAPAROSCOPIC CHOLECYSTECTOMY;  Surgeon: [name], MD;  Location: [facility];  Service: General;  Laterality: N/A;  . COLONOSCOPY    . CORONARY ANGIOPLASTY    . CORONARY ANGIOPLASTY WITH STENT PLACEMENT  2002; 2003; 11/20/2014   "I have 4 stents after today" (11/20/2014)  . CORONARY ARTERY BYPASS GRAFT N/A 01/09/2016   Procedure: CORONARY ARTERY BYPASS GRAFTING (CABG) X 3 UTILIZING RIGHT AND LEFT INTERNAL MAMMARY ARTERY AND ENDOSCOPICALLY HARVESTED SAPHENEOUS VEIN.;  Surgeon: Rexene Alberts, MD;  Location: [facility];  Service: Open Heart Surgery;  Laterality: N/A;  . ESOPHAGOGASTRODUODENOSCOPY    . KNEE SURGERY Left 02/2012   "scraped; open"  . LEFT HEART CATH AND CORS/GRAFTS ANGIOGRAPHY N/A 08/09/2017   Procedure: LEFT HEART CATH AND CORS/GRAFTS ANGIOGRAPHY;  Surgeon: [name], MD;  Location: [place] [facility];  Service: Cardiovascular;  Laterality: N/A;  . LEFT HEART CATH AND CORS/GRAFTS ANGIOGRAPHY N/A 11/22/2018   Procedure: LEFT HEART CATH AND CORS/GRAFTS ANGIOGRAPHY;  Surgeon: [name], [name], MD;  Location: [place] [facility];  Service: Cardiovascular;  Laterality: N/A;  . LEFT HEART CATHETERIZATION WITH CORONARY ANGIOGRAM N/A 07/20/2012   Procedure: LEFT HEART CATHETERIZATION WITH CORONARY ANGIOGRAM;  Surgeon: [name], MD;  Location: [facility] C[facility];  Service: Cardiovascular;  Laterality: N/A;  . LEFT HEART CATHETERIZATION WITH CORONARY ANGIOGRAM N/A 11/20/2014   Procedure: LEFT HEART CATHETERIZATION WITH CORONARY ANGIOGRAM;  Surgeon: [name], MD;  Location: [facility] CATH LAB;  Service: Cardiovascular;  Laterality: N/A;  . LEFT HEART CATHETERIZATION WITH CORONARY ANGIOGRAM N/A 11/26/2014   Procedure: LEFT HEART CATHETERIZATION WITH CORONARY ANGIOGRAM;  Surgeon: [name], MD;  Location: [facility] CATH LAB;  Service: Cardiovascular;  Laterality: N/A;  . NEUROPLASTY / TRANSPOSITION ULNAR NERVE AT ELBOW Right  ~ 2012  . PERCUTANEOUS CORONARY ROTOBLATOR INTERVENTION (PCI-R)  11/20/2014   Procedure: PERCUTANEOUS CORONARY ROTOBLATOR INTERVENTION (PCI-R);  Surgeon: [name], MD;  Location: [facility] CATH LAB;  Service: Cardiovascular;;  . SHOULDER ARTHROSCOPY Left ~ 2011  . TEE WITHOUT CARDIOVERSION N/A 01/09/2016   Procedure: TRANSESOPHAGEAL ECHOCARDIOGRAM (TEE);  Surgeon: Rexene Alberts, MD;  Location: [place];  Service: Open Heart Surgery;  Laterality: N/A;    Current Outpatient Medications  Medication Sig Dispense Refill  . albuterol (PROVENTIL HFA;VENTOLIN HFA) 108 (90 BASE) MCG/ACT inhaler Inhale 2 puffs into the lungs every 6 (six) hours as needed for wheezing or shortness of breath.    [name] amLODipine (NORVASC) 5 MG tablet Take 5 mg by mouth at bedtime.  4  . aspirin EC 81 MG tablet Take 81 mg by mouth at bedtime.     . citalopram (CELEXA) 20 MG tablet Take 20 mg by mouth at bedtime.     . diazepam (VALIUM) 5 MG tablet Take 5 mg by mouth at bedtime. *May take one tablet up to three times daily as needed for anxiety    . ergocalciferol (VITAMIN D2) 1.25 MG (50000 UT) capsule Take 1 capsule (50,000 Units total) by mouth once a week. 20 capsule 1  . ezetimibe (ZETIA) 10 MG tablet Take 10 mg by mouth daily.    . ferrous sulfate 325 (65 FE) MG EC tablet Take 325 mg by mouth once a week.     . fluticasone (FLONASE) 50 MCG/ACT nasal spray Place 1 spray into the nose 2 (two) times daily.     . Fluticasone-Salmeterol (ADVAIR) 250-50 MCG/DOSE AEPB Inhale 1 puff into the lungs every 12 (twelve) hours.    . furosemide (LASIX) 20 MG tablet Take 20 mg by mouth daily.    [name] gabapentin (NEURONTIN) 300 MG capsule Take 300 mg by mouth 3 (three) times daily.  2  . insulin aspart (NOVOLOG FLEXPEN) 100 UNIT/ML FlexPen Inject 2-30 Units into the skin 3 (three) times daily as needed for high blood sugar (CBG >150). Per sliding scale written down at home     . isosorbide mononitrate (IMDUR) 30 MG 24 hr tablet Take 30 mg by  mouth at bedtime.  4  . Melatonin 5 MG TABS Take 10 mg by mouth at bedtime.     . metFORMIN (GLUCOPHAGE) 1000 MG tablet Take 1 tablet (1,000 mg total) by mouth 2 (two) times daily. For the next 2 days as you did receive IV contrast, then resume on 11/26/2018 (Patient taking differently: Take 1,000 mg by mouth 2 (two) times daily. ) 60 tablet 11  . metoprolol tartrate (LOPRESSOR) 50 MG tablet Take 50 mg by mouth 2 (two) times daily.     . nitroGLYCERIN (NITROSTAT) 0.4 MG SL tablet Place 1 tablet (0.4 mg total) under the tongue every 5 (five) minutes x 3 doses as needed for chest pain (if no relief after  3rd dose, proceed to the ED for an evaluation or call 911). 25 tablet 3  . omeprazole (PRILOSEC) 20 MG capsule Take 20 mg by mouth 2 (two) times daily.    . pravastatin (PRAVACHOL) 20 MG tablet Take 1 tablet (20 mg total) by mouth daily. 30 tablet 0  . topiramate (TOPAMAX) 50 MG tablet Take 50 mg by mouth 2 (two) times daily.    [name] FLEXTOUCH 100 UNIT/ML SOPN FlexTouch Pen Inject 70 Units into the skin at bedtime.   5   No current facility-administered medications for this visit.   Allergies:  Divalproex sodium, Statins, Tramadol, Valproic acid, Gadolinium derivatives, and Tricor [fenofibrate]   ROS:  No palpitations or syncope.  Some right sided upper chest and shoulder discomfort with motion.  Physical Exam: VS:  BP (!) 100/58   Pulse 60   Ht 5' 9"  (1.753 m)   Wt 229 lb 3.2 oz (104 kg)   SpO2 98%   BMI 33.85 kg/m , BMI Body mass index is 33.85 kg/m.  Wt Readings from Last 3 Encounters:  04/19/20 229 lb 3.2 oz (104 kg)  03/06/20 240 lb (108.9 kg)  02/20/20 236 lb 1.8 oz (107.1 kg)    General: Patient appears comfortable at rest. HEENT: Conjunctiva and lids normal, wearing a mask.  Neck: Supple, no elevated JVP or carotid bruits, no thyromegaly. Lungs: Clear to auscultation, nonlabored breathing at rest. Cardiac: Regular rate and rhythm, no S3 or significant systolic murmur, no  pericardial rub. Extremities: No pitting edema, distal pulses 2+.  ECG:  An ECG dated 02/19/2020 was personally reviewed today and demonstrated:  Sinus rhythm with nonspecific ST-T changes.  Recent Labwork: 02/20/2020: Magnesium 1.8; TSH 2.203 03/19/2020: ALT 42; AST 46; BUN 18; Creatinine, Ser 1.06; Hemoglobin 15.8; Platelets 88; Potassium 4.2; Sodium 137     Component Value Date/Time   CHOL 150 02/20/2020 0452   TRIG 191 (H) 02/20/2020 0452   HDL 26 (L) 02/20/2020 0452   CHOLHDL 5.8 02/20/2020 0452   VLDL 38 02/20/2020 0452   LDLCALC 86 02/20/2020 0452    Other Studies Reviewed Today:  Cardiac catheterization1/14/2020:  Prox LAD to Mid LAD lesion is 90% stenosed.  Mid LAD lesion is 90% stenosed.  Ost LAD lesion is 50% stenosed.  1st Diag lesion is 80% stenosed.  Mid RCA lesion is 40% stenosed.  Dist RCA lesion is 40% stenosed.  Ost RPDA lesion is 100% stenosed.  SVG and is small.  LIMA and is small.  1st Mrg lesion is 99% stenosed.  Prox Cx lesion is 50% stenosed.  RIMA graft was visualized by angiography and is normal in caliber.  Origin to Prox Graft lesion is 40% stenosed.  LV end diastolic pressure is normal.  1. Severe 3 vessel obstructive CAD 2. Patent LIMA to the distal LAD 3. Patent free RIMA to the OM1 4. Patent SVG to PDA 5. Normal LVEDP  Compared to prior angiogram dated 08/09/17 there is no significant change. Recommend continued medical therapy.  Chest x-ray 03/06/2020: FINDINGS: The heart size and mediastinal contours are within normal limits. Both lungs are clear. Status post coronary bypass graft. The visualized skeletal structures are unremarkable.  IMPRESSION: No active disease.  Assessment and Plan:  1.  Multivessel CAD status post CABG with patent bypass grafts at cardiac catheterization in January 2020.  No progression in angina symptoms on current medical regimen.  Continue aspirin, Norvasc, Zetia, Imdur, Lopressor, and  Pravachol.  As needed nitroglycerin is available.  2.  Mixed hyperlipidemia, continue Pravachol and Zetia.  3.  Essential hypertension, blood pressure very well controlled today.  Continue Norvasc and Lopressor.  Medication Adjustments/Labs and Tests Ordered: Current medicines are reviewed at length with the patient today.  Concerns regarding medicines are outlined above.   Tests Ordered: No orders of the defined types were placed in this encounter.   Medication Changes: No orders of the defined types were placed in this encounter.   Disposition:  Follow up 6 months in the [place] office.  Signed, [name], MD, [facility] 04/19/2020 10:08 AM    [place] [facility] [place], [place], [place] [address] Phone: [phone]; Fax: [phone]

## 2020-05-30 ENCOUNTER — Other Ambulatory Visit (HOSPITAL_COMMUNITY): Payer: Self-pay | Admitting: *Deleted

## 2020-05-30 ENCOUNTER — Telehealth (HOSPITAL_COMMUNITY): Payer: Self-pay | Admitting: *Deleted

## 2020-05-30 ENCOUNTER — Other Ambulatory Visit (HOSPITAL_COMMUNITY): Payer: Self-pay | Admitting: Nurse Practitioner

## 2020-05-30 DIAGNOSIS — D61818 Other pancytopenia: Secondary | ICD-10-CM

## 2020-05-30 DIAGNOSIS — D696 Thrombocytopenia, unspecified: Secondary | ICD-10-CM

## 2020-05-30 DIAGNOSIS — E559 Vitamin D deficiency, unspecified: Secondary | ICD-10-CM

## 2020-05-30 NOTE — Telephone Encounter (Signed)
Pt called into clinic stated he had questions about his low platelet count. Pt was last seen in clinic on 09/06/19. I spoke with Francene Finders NP and she stated the pt needs to come in for a follow-up visit with labs and office visit. I spoke with scheduler and she stated she would call pt to schedule labs and an office visit with Francene Finders, NP.

## 2020-05-31 ENCOUNTER — Inpatient Hospital Stay (HOSPITAL_COMMUNITY): Payer: Medicare Other | Attending: Hematology

## 2020-05-31 ENCOUNTER — Other Ambulatory Visit: Payer: Self-pay

## 2020-05-31 DIAGNOSIS — D696 Thrombocytopenia, unspecified: Secondary | ICD-10-CM | POA: Diagnosis present

## 2020-05-31 DIAGNOSIS — D649 Anemia, unspecified: Secondary | ICD-10-CM | POA: Insufficient documentation

## 2020-05-31 DIAGNOSIS — K7460 Unspecified cirrhosis of liver: Secondary | ICD-10-CM | POA: Insufficient documentation

## 2020-05-31 DIAGNOSIS — E559 Vitamin D deficiency, unspecified: Secondary | ICD-10-CM | POA: Diagnosis not present

## 2020-05-31 DIAGNOSIS — D61818 Other pancytopenia: Secondary | ICD-10-CM

## 2020-05-31 LAB — IRON AND TIBC
Iron: 101 ug/dL (ref 45–182)
Saturation Ratios: 27 % (ref 17.9–39.5)
TIBC: 374 ug/dL (ref 250–450)
UIBC: 273 ug/dL

## 2020-05-31 LAB — COMPREHENSIVE METABOLIC PANEL
ALT: 47 U/L — ABNORMAL HIGH (ref 0–44)
AST: 46 U/L — ABNORMAL HIGH (ref 15–41)
Albumin: 4.1 g/dL (ref 3.5–5.0)
Alkaline Phosphatase: 45 U/L (ref 38–126)
Anion gap: 9 (ref 5–15)
BUN: 17 mg/dL (ref 8–23)
CO2: 24 mmol/L (ref 22–32)
Calcium: 9 mg/dL (ref 8.9–10.3)
Chloride: 106 mmol/L (ref 98–111)
Creatinine, Ser: 1 mg/dL (ref 0.61–1.24)
GFR calc Af Amer: >60 mL/min (ref >60)
GFR calc non Af Amer: >60 mL/min (ref >60)
Glucose, Bld: 123 mg/dL — ABNORMAL HIGH (ref 70–99)
Potassium: 3.8 mmol/L (ref 3.5–5.1)
Sodium: 139 mmol/L (ref 135–145)
Total Bilirubin: 0.6 mg/dL (ref 0.3–1.2)
Total Protein: 7.6 g/dL (ref 6.5–8.1)

## 2020-05-31 LAB — CBC WITH DIFFERENTIAL/PLATELET
Abs Immature Granulocytes: 0.01 10*3/uL (ref 0.00–0.07)
Basophils Absolute: 0 10*3/uL (ref 0.0–0.1)
Basophils Relative: 1 %
Eosinophils Absolute: 0.1 10*3/uL (ref 0.0–0.5)
Eosinophils Relative: 3 %
HCT: 45.1 % (ref 39.0–52.0)
Hemoglobin: 15.9 g/dL (ref 13.0–17.0)
Immature Granulocytes: 0 %
Lymphocytes Relative: 34 %
Lymphs Abs: 1.3 10*3/uL (ref 0.7–4.0)
MCH: 32 pg (ref 26.0–34.0)
MCHC: 35.3 g/dL (ref 30.0–36.0)
MCV: 90.7 fL (ref 80.0–100.0)
Monocytes Absolute: 0.3 10*3/uL (ref 0.1–1.0)
Monocytes Relative: 8 %
Neutro Abs: 2 10*3/uL (ref 1.7–7.7)
Neutrophils Relative %: 54 %
Platelets: 97 10*3/uL — ABNORMAL LOW (ref 150–400)
RBC: 4.97 MIL/uL (ref 4.22–5.81)
RDW: 13.3 % (ref 11.5–15.5)
WBC: 3.8 10*3/uL — ABNORMAL LOW (ref 4.0–10.5)
nRBC: 0 % (ref 0.0–0.2)

## 2020-05-31 LAB — FERRITIN: Ferritin: 100 ng/mL (ref 24–336)

## 2020-05-31 LAB — VITAMIN D 25 HYDROXY (VIT D DEFICIENCY, FRACTURES): Vit D, 25-Hydroxy: 25.89 ng/mL — ABNORMAL LOW (ref 30–100)

## 2020-05-31 LAB — VITAMIN B12: Vitamin B-12: 1342 pg/mL — ABNORMAL HIGH (ref 180–914)

## 2020-05-31 MED ORDER — CYANOCOBALAMIN 1000 MCG/ML IJ SOLN
INTRAMUSCULAR | Status: AC
  Filled 2020-05-31: qty 1

## 2020-06-05 ENCOUNTER — Inpatient Hospital Stay (HOSPITAL_BASED_OUTPATIENT_CLINIC_OR_DEPARTMENT_OTHER): Payer: Medicare Other | Admitting: Nurse Practitioner

## 2020-06-05 ENCOUNTER — Other Ambulatory Visit: Payer: Self-pay

## 2020-06-05 DIAGNOSIS — D696 Thrombocytopenia, unspecified: Secondary | ICD-10-CM

## 2020-06-05 NOTE — Progress Notes (Signed)
[name], [place] [address]   CLINIC:  Medical Oncology/Hematology  PCP:  Terrial Rhodes, MD [address] [place] [address] [phone]   REASON FOR VISIT: Follow-up for thrombocytopenia   CURRENT THERAPY: Observation   INTERVAL HISTORY:  [name] [name] 64 y.o. male returns for routine follow-up for thrombocytopenia.  Patient reports he has been doing well since his last visit.  He denies any bright red bleeding.  He denies any easy bruising.  He denies any petechiae rash. Denies any nausea, vomiting, or diarrhea. Denies any new pains. Had not noticed any recent bleeding such as epistaxis, hematuria or hematochezia. Denies recent chest pain on exertion, shortness of breath on minimal exertion, pre-syncopal episodes, or palpitations. Denies any numbness or tingling in hands or feet. Denies any recent fevers, infections, or recent hospitalizations. Patient reports appetite at 100% and energy level at 50%.  He is eating well maintain his weight this time.     REVIEW OF SYSTEMS:  Review of Systems  All other systems reviewed and are negative.    PAST MEDICAL/SURGICAL HISTORY:  Past Medical History:  Diagnosis Date  . Anxiety   . Arthritis   . Asthma   . Cataract    Right eye  . Chronic lower back pain   . Cirrhosis of liver ([place])   . Coronary atherosclerosis of native coronary artery    a. LAD & OM stenting;  b. 2007 Cath: nonobs dzs, patent stents;  c. 07/2012 neg MV, EF 61%.  d. 11/20/14: [place] s/p  PCI w/ DES to pLAD and DES to OM1   . Essential hypertension   . GERD (gastroesophageal reflux disease)   . Hearing loss of left ear   . History of gout   . History of hiatal hernia   . Hypercholesteremia   . Lumbar herniated disc   . MI, old   . Migraine   . OSA (obstructive sleep apnea)   . 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 Thrombocytopenia ([place])   . TIA  (transient ischemic attack) ~ 2010  . Type 2 diabetes mellitus ([place])    Past Surgical History:  Procedure Laterality Date  . ANTERIOR CERVICAL DECOMP/DISCECTOMY FUSION  1998   "C3-4"  . CARDIAC CATHETERIZATION  "several"  . CARDIAC CATHETERIZATION N/A 12/30/2015   Procedure: Left Heart Cath and Coronary Angiography;  Surgeon: [name], MD;  Location: [facility];  Service: Cardiovascular;  Laterality: N/A;  . CARPAL TUNNEL RELEASE Bilateral 2005  . CHOLECYSTECTOMY N/A 11/28/2018   Procedure: LAPAROSCOPIC CHOLECYSTECTOMY;  Surgeon: [name], MD;  Location: [facility];  Service: General;  Laterality: N/A;  . COLONOSCOPY    . CORONARY ANGIOPLASTY    . CORONARY ANGIOPLASTY WITH STENT PLACEMENT  2002; 2003; 11/20/2014   "I have 4 stents after today" (11/20/2014)  . CORONARY ARTERY BYPASS GRAFT N/A 01/09/2016   Procedure: CORONARY ARTERY BYPASS GRAFTING (CABG) X 3 UTILIZING RIGHT AND LEFT INTERNAL MAMMARY ARTERY AND ENDOSCOPICALLY HARVESTED SAPHENEOUS VEIN.;  Surgeon: Rexene Alberts, MD;  Location: [facility];  Service: Open Heart Surgery;  Laterality: N/A;  . ESOPHAGOGASTRODUODENOSCOPY    . KNEE SURGERY Left 02/2012   "scraped; open"  . LEFT HEART CATH AND CORS/GRAFTS ANGIOGRAPHY N/A 08/09/2017   Procedure: LEFT HEART CATH AND CORS/GRAFTS ANGIOGRAPHY;  Surgeon: [name], MD;  Location: [facility];  Service: Cardiovascular;  Laterality: N/A;  . LEFT HEART CATH AND CORS/GRAFTS ANGIOGRAPHY N/A 11/22/2018   Procedure: LEFT HEART CATH AND CORS/GRAFTS ANGIOGRAPHY;  Surgeon: [name], [name], MD;  Location: [facility];  Service: Cardiovascular;  Laterality: N/A;  . LEFT HEART CATHETERIZATION WITH CORONARY ANGIOGRAM N/A 07/20/2012   Procedure: LEFT HEART CATHETERIZATION WITH CORONARY ANGIOGRAM;  Surgeon: [name], MD;  Location: [facility];  Service: Cardiovascular;  Laterality: N/A;  . LEFT HEART CATHETERIZATION WITH CORONARY ANGIOGRAM N/A 11/20/2014    Procedure: LEFT HEART CATHETERIZATION WITH CORONARY ANGIOGRAM;  Surgeon: [name], MD;  Location: [facility] CATH LAB;  Service: Cardiovascular;  Laterality: N/A;  . LEFT HEART CATHETERIZATION WITH CORONARY ANGIOGRAM N/A 11/26/2014   Procedure: LEFT HEART CATHETERIZATION WITH CORONARY ANGIOGRAM;  Surgeon: [name], MD;  Location: [facility] CATH LAB;  Service: Cardiovascular;  Laterality: N/A;  . NEUROPLASTY / TRANSPOSITION ULNAR NERVE AT ELBOW Right ~ 2012  . PERCUTANEOUS CORONARY ROTOBLATOR INTERVENTION (PCI-R)  11/20/2014   Procedure: PERCUTANEOUS CORONARY ROTOBLATOR INTERVENTION (PCI-R);  Surgeon: [name], MD;  Location: [facility] CATH LAB;  Service: Cardiovascular;;  . SHOULDER ARTHROSCOPY Left ~ 2011  . TEE WITHOUT CARDIOVERSION N/A 01/09/2016   Procedure: TRANSESOPHAGEAL ECHOCARDIOGRAM (TEE);  Surgeon: Rexene Alberts, MD;  Location: [facility];  Service: Open Heart Surgery;  Laterality: N/A;     SOCIAL HISTORY:  Social History   Socioeconomic History  . Marital status: Married    Spouse name: [name]  . Number of children: 3  . Years of education: 73 th   . Highest education level: Not on file  Occupational History  . Occupation: Unemployed  Tobacco Use  . Smoking status: Never Smoker  . Smokeless tobacco: Never Used  Vaping Use  . Vaping Use: Never used  Substance and Sexual Activity  . Alcohol use: No  . Drug use: No  . Sexual activity: Not on file  Other Topics Concern  . Not on file  Social History Narrative   Patient lives at home with wife [name].   Patient works at [employer], Engineer, petroleum.   Patient has a 12 th grade education.    Patient has 3 children.       Social Determinants of Health   Financial Resource Strain:   . Difficulty of Paying Living Expenses:   Food Insecurity:   . Worried About Charity fundraiser in the Last Year:   . Arboriculturist in the Last Year:   Transportation Needs:   . Film/video editor (Medical):   [name] Lack of  Transportation (Non-Medical):   Physical Activity:   . Days of Exercise per Week:   . Minutes of Exercise per Session:   Stress:   . Feeling of Stress :   Social Connections:   . Frequency of Communication with Friends and Family:   . Frequency of Social Gatherings with Friends and Family:   . Attends Religious Services:   . Active Member of Clubs or Organizations:   . Attends Archivist Meetings:   [name] Marital Status:   Intimate Partner Violence:   . Fear of Current or Ex-Partner:   . Emotionally Abused:   [name] Physically Abused:   . Sexually Abused:     FAMILY HISTORY:  Family History  Problem Relation Age of Onset  . Stroke Mother   . Coronary artery disease Father 41  . Asthma Sister   . Multiple sclerosis Brother   .  Heart disease Paternal Uncle   . Stroke Maternal Grandmother   . Heart attack Paternal Grandmother   . Cancer Paternal Grandfather   . Asthma Sister   . Seizures Son   . Migraines Son   . Autism Son   . Migraines Son     CURRENT MEDICATIONS:  Outpatient Encounter Medications as of 06/05/2020  Medication Sig  . amLODipine (NORVASC) 5 MG tablet Take 5 mg by mouth at bedtime.  [name] aspirin EC 81 MG tablet Take 81 mg by mouth at bedtime.   . citalopram (CELEXA) 20 MG tablet Take 20 mg by mouth at bedtime.   . diazepam (VALIUM) 5 MG tablet Take 5 mg by mouth at bedtime. *May take one tablet up to three times daily as needed for anxiety  . ergocalciferol (VITAMIN D2) 1.25 MG (50000 UT) capsule Take 1 capsule (50,000 Units total) by mouth once a week.  . ezetimibe (ZETIA) 10 MG tablet Take 10 mg by mouth daily.  . ferrous sulfate 325 (65 FE) MG EC tablet Take 325 mg by mouth once a week.   . fluticasone (FLONASE) 50 MCG/ACT nasal spray Place 1 spray into the nose 2 (two) times daily.   . Fluticasone-Salmeterol (ADVAIR) 250-50 MCG/DOSE AEPB Inhale 1 puff into the lungs every 12 (twelve) hours.  . furosemide (LASIX) 20 MG tablet Take 20 mg by mouth daily.    [name] gabapentin (NEURONTIN) 300 MG capsule Take 300 mg by mouth 3 (three) times daily.  . insulin aspart (NOVOLOG FLEXPEN) 100 UNIT/ML FlexPen Inject 2-30 Units into the skin 3 (three) times daily as needed for high blood sugar (CBG >150). Per sliding scale written down at home   . isosorbide mononitrate (IMDUR) 30 MG 24 hr tablet Take 30 mg by mouth at bedtime.  . Melatonin 5 MG TABS Take 10 mg by mouth at bedtime.   . metFORMIN (GLUCOPHAGE) 1000 MG tablet Take 1 tablet (1,000 mg total) by mouth 2 (two) times daily. For the next 2 days as you did receive IV contrast, then resume on 11/26/2018 (Patient taking differently: Take 1,000 mg by mouth 2 (two) times daily. )  . metoprolol tartrate (LOPRESSOR) 50 MG tablet Take 50 mg by mouth 2 (two) times daily.   [name] omeprazole (PRILOSEC) 20 MG capsule Take 20 mg by mouth 2 (two) times daily.  [name] topiramate (TOPAMAX) 50 MG tablet Take 50 mg by mouth 2 (two) times daily.  [name] FLEXTOUCH 100 UNIT/ML SOPN FlexTouch Pen Inject 70 Units into the skin at bedtime.   . vitamin B-12 (CYANOCOBALAMIN) 1000 MCG tablet 1 tab(s)  . albuterol (PROVENTIL HFA;VENTOLIN HFA) 108 (90 BASE) MCG/ACT inhaler Inhale 2 puffs into the lungs every 6 (six) hours as needed for wheezing or shortness of breath. (Patient not taking: Reported on 06/05/2020)  . cyclobenzaprine (FLEXERIL) 10 MG tablet 1 tab(s) (Patient not taking: Pt hasn't started yet)  . nitroGLYCERIN (NITROSTAT) 0.4 MG SL tablet Place 1 tablet (0.4 mg total) under the tongue every 5 (five) minutes x 3 doses as needed for chest pain (if no relief after 3rd dose, proceed to the ED for an evaluation or call 911).  . pravastatin (PRAVACHOL) 20 MG tablet Take 1 tablet (20 mg total) by mouth daily.   No facility-administered encounter medications on file as of 06/05/2020.    ALLERGIES:  Allergies  Allergen Reactions  . Divalproex Sodium Other (See Comments)    Causes anger  . Statins Other (See Comments)    Muscle  aches  and cramps  . Tramadol Other (See Comments)    Chest pain   . Valproic Acid Other (See Comments)    "anger"  . Other     Other reaction(s): Unknown  . Gadolinium Derivatives Nausea And Vomiting    07/25/19 Pt vomited immediately after IV gad. Denies itching, dyspnea.  (Adverse, not allergic, reaction  . Tricor [Fenofibrate] Other (See Comments)    Leg cramps     PHYSICAL EXAM:  ECOG Performance status: 1  Vitals:   06/05/20 0959  BP: (!) 116/60  Pulse: 65  Resp: 18  Temp: (!) 97.1 F (36.2 C)  SpO2: 97%   Filed Weights   06/05/20 0959  Weight: (!) 231 lb (104.8 kg)   Physical Exam Constitutional:      Appearance: Normal appearance. He is normal weight.  Cardiovascular:     Rate and Rhythm: Normal rate and regular rhythm.     Heart sounds: Normal heart sounds.  Pulmonary:     Effort: Pulmonary effort is normal.     Breath sounds: Normal breath sounds.  Abdominal:     General: Bowel sounds are normal.     Palpations: Abdomen is soft.  Musculoskeletal:        General: Normal range of motion.  Skin:    General: Skin is warm and dry.  Neurological:     Mental Status: He is alert and oriented to person, place, and time. Mental status is at baseline.  Psychiatric:        Mood and Affect: Mood normal.        Behavior: Behavior normal.        Thought Content: Thought content normal.        Judgment: Judgment normal.      LABORATORY DATA:  I have reviewed the labs as listed.  CBC    Component Value Date/Time   WBC 3.8 (L) 05/31/2020 1042   RBC 4.97 05/31/2020 1042   HGB 15.9 05/31/2020 1042   HCT 45.1 05/31/2020 1042   PLT 97 (L) 05/31/2020 1042   MCV 90.7 05/31/2020 1042   MCH 32.0 05/31/2020 1042   MCHC 35.3 05/31/2020 1042   RDW 13.3 05/31/2020 1042   LYMPHSABS 1.3 05/31/2020 1042   MONOABS 0.3 05/31/2020 1042   EOSABS 0.1 05/31/2020 1042   BASOSABS 0.0 05/31/2020 1042   CMP Latest Ref Rng & Units 05/31/2020 03/19/2020 03/06/2020  Glucose 70 - 99  mg/dL 123(H) 193(H) 233(H)  BUN 8 - 23 mg/dL 17 18 16   Creatinine 0.61 - 1.24 mg/dL 1.00 1.06 1.19  Sodium 135 - 145 mmol/L 139 137 136  Potassium 3.5 - 5.1 mmol/L 3.8 4.2 3.7  Chloride 98 - 111 mmol/L 106 103 102  CO2 22 - 32 mmol/L 24 25 22   Calcium 8.9 - 10.3 mg/dL 9.0 8.8(L) 8.9  Total Protein 6.5 - 8.1 g/dL 7.6 7.6 7.3  Total Bilirubin 0.3 - 1.2 mg/dL 0.6 0.8 1.0  Alkaline Phos 38 - 126 U/L 45 44 43  AST 15 - 41 U/L 46(H) 46(H) 36  ALT 0 - 44 U/L 47(H) 42 36    All questions were answered to patient's stated satisfaction. Encouraged patient to call with any new concerns or questions before his next visit to the cancer center and we can certain see him sooner, if needed.     ASSESSMENT & PLAN:  Thrombocytopenia ([place]) 1.  Thrombocytopenia: -He has mild thrombocytopenia since 2009. -Ultrasound of the abdomen on 11/22/2018 showed borderline splenomegaly,  12.3 cm, but elevated volume at 517 mL's. -CT scan of the abdomen and pelvis on 11/23/2018 showed hepatic cirrhosis.  Spleen size is normal without focal abnormality.  Hepatitis panel was negative. -Nutritional work-up included serum copper, folic acid, and Q94 were all within normal limits.  SPEP was negative.  H. pylori antibodies were negative. -ANA was negative.  However rheumatoid factor was strongly positive at 68.9. - Patient does report arthritis of small joints in hands and feet.  He also has hip pains.  Rheumatology felt it was osteoarthritis. - Labs on 05/31/2020 showed his platelet count 97 - He will follow-up in 6 months with repeat labs  2.  Normocytic anemia: - He was found to have normocytic anemia during a recent hospital admission.  This is likely postsurgical. - He was placed on oral iron therapy with a stool softener. - He reports stool sample was brought in for occult blood testing however the results are not in the computer. -He was referred to Dr. Laural Golden.  His last colonoscopy was greater than 5 years ago in  [place] on 05/31/2020 showed his hemoglobin is stable at 15.9, ferritin 100, percent saturation 27 -We recommended him take oral vitamin B12 daily. -He needs another referral to Dr. Laural Golden for colonoscopy and EGD. -He will follow-up in 6 months with repeat labs  3.  Vitamin D deficiency: - Vitamin D was initially severely low at 13.2. -He never received the prescription for his vitamin D last visit.  So I called him in a new prescription for vitamin D 50,000 units weekly.  Sent to [facility] drug.  Prescription was too expensive. - Labs on 05/31/2020 showed his vitamin D level at 25.89 -He will start taking vitamin D twice daily. -We will check at his next visit  4.  Cirrhosis: - Cholecystectomy and liver biopsy on 11/28/2018 showed cirrhosis. -He is following up with Dr. Laural Golden.      Orders placed this encounter:  Orders Placed This Encounter  Procedures  . Lactate dehydrogenase  . CBC with Differential/Platelet  . Comprehensive metabolic panel  . Ferritin  . Iron and TIBC  . Vitamin B12  . VITAMIN D 25 Hydroxy (Vit-D Deficiency, Fractures)  . Folate      Francene Finders, FNP-C [place] [phone]

## 2020-06-05 NOTE — Assessment & Plan Note (Addendum)
1.  Thrombocytopenia: -He has mild thrombocytopenia since 2009. -Ultrasound of the abdomen on 11/22/2018 showed borderline splenomegaly, 12.3 cm, but elevated volume at 517 mL's. -CT scan of the abdomen and pelvis on 11/23/2018 showed hepatic cirrhosis.  Spleen size is normal without focal abnormality.  Hepatitis panel was negative. -Nutritional work-up included serum copper, folic acid, and X50 were all within normal limits.  SPEP was negative.  H. pylori antibodies were negative. -ANA was negative.  However rheumatoid factor was strongly positive at 68.9. - Patient does report arthritis of small joints in hands and feet.  He also has hip pains.  Rheumatology felt it was osteoarthritis. - Labs on 05/31/2020 showed his platelet count 97 - He will follow-up in 6 months with repeat labs  2.  Normocytic anemia: - He was found to have normocytic anemia during a recent hospital admission.  This is likely postsurgical. - He was placed on oral iron therapy with a stool softener. - He reports stool sample was brought in for occult blood testing however the results are not in the computer. -He was referred to Dr. Laural Golden.  His last colonoscopy was greater than 5 years ago in [place] on 05/31/2020 showed his hemoglobin is stable at 15.9, ferritin 100, percent saturation 27 -We recommended him take oral vitamin B12 daily. -He needs another referral to Dr. Laural Golden for colonoscopy and EGD. -He will follow-up in 6 months with repeat labs  3.  Vitamin D deficiency: - Vitamin D was initially severely low at 13.2. -He never received the prescription for his vitamin D last visit.  So I called him in a new prescription for vitamin D 50,000 units weekly.  Sent to [facility] drug.  Prescription was too expensive. - Labs on 05/31/2020 showed his vitamin D level at 25.89 -He will start taking vitamin D twice daily. -We will check at his next visit  4.  Cirrhosis: - Cholecystectomy and liver biopsy on  11/28/2018 showed cirrhosis. -He is following up with Dr. Laural Golden.

## 2020-06-11 ENCOUNTER — Encounter (INDEPENDENT_AMBULATORY_CARE_PROVIDER_SITE_OTHER): Payer: Self-pay | Admitting: Gastroenterology

## 2020-07-25 ENCOUNTER — Telehealth: Payer: Self-pay | Admitting: Adult Health

## 2020-07-25 NOTE — Telephone Encounter (Signed)
Pt's wife, Raekwan Spelman called have lost prescription paperwork for CPAP.  Need prescription for CPAP. Would like a call from the nurse.

## 2020-07-25 NOTE — Telephone Encounter (Signed)
Fax confirmation received for cpap order to [place] [phone].

## 2020-07-25 NOTE — Telephone Encounter (Signed)
I called pts wife.  He now has new insurance and hopefully will be able to get new cpap machine.  Fax confirmation received [phone], and tried [phone].  Getting out of hospital last 5 days for COVID.  Will be on oxygen.

## 2020-07-27 ENCOUNTER — Other Ambulatory Visit: Payer: Self-pay

## 2020-07-27 ENCOUNTER — Encounter (HOSPITAL_COMMUNITY): Payer: Self-pay

## 2020-07-27 ENCOUNTER — Emergency Department (HOSPITAL_COMMUNITY): Payer: Medicare Other

## 2020-07-27 ENCOUNTER — Emergency Department (HOSPITAL_COMMUNITY)
Admission: EM | Admit: 2020-07-27 | Discharge: 2020-07-27 | Disposition: A | Discharge status: Home / Self Care | Payer: Medicare Other | Attending: Emergency Medicine | Admitting: Emergency Medicine

## 2020-07-27 DIAGNOSIS — I110 Hypertensive heart disease with heart failure: Secondary | ICD-10-CM | POA: Diagnosis not present

## 2020-07-27 DIAGNOSIS — E876 Hypokalemia: Secondary | ICD-10-CM | POA: Insufficient documentation

## 2020-07-27 DIAGNOSIS — Z951 Presence of aortocoronary bypass graft: Secondary | ICD-10-CM | POA: Insufficient documentation

## 2020-07-27 DIAGNOSIS — R109 Unspecified abdominal pain: Secondary | ICD-10-CM

## 2020-07-27 DIAGNOSIS — E119 Type 2 diabetes mellitus without complications: Secondary | ICD-10-CM | POA: Diagnosis not present

## 2020-07-27 DIAGNOSIS — F0390 Unspecified dementia without behavioral disturbance: Secondary | ICD-10-CM | POA: Diagnosis not present

## 2020-07-27 DIAGNOSIS — Z7982 Long term (current) use of aspirin: Secondary | ICD-10-CM | POA: Diagnosis not present

## 2020-07-27 DIAGNOSIS — Z79899 Other long term (current) drug therapy: Secondary | ICD-10-CM | POA: Insufficient documentation

## 2020-07-27 DIAGNOSIS — M545 Low back pain: Secondary | ICD-10-CM | POA: Insufficient documentation

## 2020-07-27 DIAGNOSIS — R410 Disorientation, unspecified: Secondary | ICD-10-CM | POA: Diagnosis present

## 2020-07-27 DIAGNOSIS — M549 Dorsalgia, unspecified: Secondary | ICD-10-CM

## 2020-07-27 DIAGNOSIS — K219 Gastro-esophageal reflux disease without esophagitis: Secondary | ICD-10-CM | POA: Diagnosis not present

## 2020-07-27 DIAGNOSIS — Z794 Long term (current) use of insulin: Secondary | ICD-10-CM | POA: Insufficient documentation

## 2020-07-27 DIAGNOSIS — Z7951 Long term (current) use of inhaled steroids: Secondary | ICD-10-CM | POA: Insufficient documentation

## 2020-07-27 DIAGNOSIS — R079 Chest pain, unspecified: Secondary | ICD-10-CM | POA: Diagnosis not present

## 2020-07-27 DIAGNOSIS — I2510 Atherosclerotic heart disease of native coronary artery without angina pectoris: Secondary | ICD-10-CM | POA: Insufficient documentation

## 2020-07-27 DIAGNOSIS — J45909 Unspecified asthma, uncomplicated: Secondary | ICD-10-CM | POA: Insufficient documentation

## 2020-07-27 DIAGNOSIS — I5031 Acute diastolic (congestive) heart failure: Secondary | ICD-10-CM | POA: Insufficient documentation

## 2020-07-27 DIAGNOSIS — Z8673 Personal history of transient ischemic attack (TIA), and cerebral infarction without residual deficits: Secondary | ICD-10-CM | POA: Insufficient documentation

## 2020-07-27 LAB — COMPREHENSIVE METABOLIC PANEL
ALT: 49 U/L — ABNORMAL HIGH (ref 0–44)
AST: 46 U/L — ABNORMAL HIGH (ref 15–41)
Albumin: 3.4 g/dL — ABNORMAL LOW (ref 3.5–5.0)
Alkaline Phosphatase: 46 U/L (ref 38–126)
Anion gap: 9 (ref 5–15)
BUN: 23 mg/dL (ref 8–23)
CO2: 22 mmol/L (ref 22–32)
Calcium: 8.5 mg/dL — ABNORMAL LOW (ref 8.9–10.3)
Chloride: 108 mmol/L (ref 98–111)
Creatinine, Ser: 1.04 mg/dL (ref 0.61–1.24)
GFR calc Af Amer: >60 mL/min (ref >60)
GFR calc non Af Amer: >60 mL/min (ref >60)
Glucose, Bld: 186 mg/dL — ABNORMAL HIGH (ref 70–99)
Potassium: 3.2 mmol/L — ABNORMAL LOW (ref 3.5–5.1)
Sodium: 139 mmol/L (ref 135–145)
Total Bilirubin: 1.3 mg/dL — ABNORMAL HIGH (ref 0.3–1.2)
Total Protein: 7.2 g/dL (ref 6.5–8.1)

## 2020-07-27 LAB — CBC WITH DIFFERENTIAL/PLATELET
Abs Immature Granulocytes: 0.05 10*3/uL (ref 0.00–0.07)
Basophils Absolute: 0 10*3/uL (ref 0.0–0.1)
Basophils Relative: 0 %
Eosinophils Absolute: 0.1 10*3/uL (ref 0.0–0.5)
Eosinophils Relative: 1 %
HCT: 45.7 % (ref 39.0–52.0)
Hemoglobin: 16.1 g/dL (ref 13.0–17.0)
Immature Granulocytes: 1 %
Lymphocytes Relative: 14 %
Lymphs Abs: 0.7 10*3/uL (ref 0.7–4.0)
MCH: 31.4 pg (ref 26.0–34.0)
MCHC: 35.2 g/dL (ref 30.0–36.0)
MCV: 89.3 fL (ref 80.0–100.0)
Monocytes Absolute: 0.4 10*3/uL (ref 0.1–1.0)
Monocytes Relative: 8 %
Neutro Abs: 3.7 10*3/uL (ref 1.7–7.7)
Neutrophils Relative %: 76 %
Platelets: 114 10*3/uL — ABNORMAL LOW (ref 150–400)
RBC: 5.12 MIL/uL (ref 4.22–5.81)
RDW: 13 % (ref 11.5–15.5)
WBC: 4.9 10*3/uL (ref 4.0–10.5)
nRBC: 0 % (ref 0.0–0.2)

## 2020-07-27 LAB — LIPASE, BLOOD: Lipase: 42 U/L (ref 11–51)

## 2020-07-27 LAB — URINALYSIS, ROUTINE W REFLEX MICROSCOPIC
Bacteria, UA: NONE SEEN
Bilirubin Urine: NEGATIVE
Glucose, UA: NEGATIVE mg/dL
Hgb urine dipstick: NEGATIVE
Ketones, ur: 20 mg/dL — AB
Leukocytes,Ua: NEGATIVE
Nitrite: NEGATIVE
Protein, ur: 30 mg/dL — AB
Specific Gravity, Urine: 1.024 (ref 1.005–1.030)
pH: 5 (ref 5.0–8.0)

## 2020-07-27 LAB — D-DIMER, QUANTITATIVE: D-Dimer, Quant: 0.42 ug/mL-FEU (ref 0.00–0.50)

## 2020-07-27 LAB — TROPONIN I (HIGH SENSITIVITY): Troponin I (High Sensitivity): 6 ng/L (ref <18)

## 2020-07-27 MED ORDER — KETOROLAC TROMETHAMINE 30 MG/ML IJ SOLN
30.0000 mg | Freq: Once | INTRAMUSCULAR | Status: AC
  Administered 2020-07-27: 30 mg via INTRAMUSCULAR
  Filled 2020-07-27: qty 1

## 2020-07-27 MED ORDER — METHOCARBAMOL 500 MG PO TABS: ORAL_TABLET | ORAL | 0 refills | Status: DC

## 2020-07-27 MED ORDER — METHOCARBAMOL 500 MG PO TABS
500.0000 mg | ORAL_TABLET | Freq: Once | ORAL | Status: AC
  Administered 2020-07-27: 500 mg via ORAL
  Filled 2020-07-27: qty 1

## 2020-07-27 MED ORDER — POTASSIUM CHLORIDE CRYS ER 20 MEQ PO TBCR: 20.0000 meq | EXTENDED_RELEASE_TABLET | Freq: Two times a day (BID) | ORAL | 0 refills | Status: DC

## 2020-07-27 MED ORDER — CYCLOBENZAPRINE HCL 5 MG PO TABS: 5.0000 mg | ORAL_TABLET | Freq: Three times a day (TID) | ORAL | 0 refills | Status: DC | PRN

## 2020-07-27 NOTE — Discharge Instructions (Addendum)
Please check your drugstore to see if the discharge medications from [facility] are there.  The Decadron will help both with his Covid pneumonia to resolve and with his low back pain.  Get the other prescriptions filled that I prescribed today.  Use ice and heat for comfort on his back.  Please follow-up with his primary care doctor, he may need referral to a spine specialist if he continues to have back pain.  Or you can call one of the back specialist that I have listed for you.

## 2020-07-27 NOTE — ED Triage Notes (Signed)
Pt reports severe back pain since Friday night. Pt denies injury. Pt says his back hurts all over.

## 2020-07-27 NOTE — ED Provider Notes (Signed)
[facility] EMERGENCY DEPARTMENT Provider Note   CSN: [id number] Arrival date & time: 07/27/20  0219   Time seen 3:45 AM  History Chief Complaint  Patient presents with  . Back Pain   Level 5 caveat for confusion  [name] [name] is a 64 y.o. male.  HPI   Patient is here with his wife.  She states patient had a stroke years ago and has some mild confusion.  He was diagnosed as having Covid on September 6 and she states he was admitted to the hospital from September 11 through the 16th and she does not know why.  She states that his oxygen was fine and she took him to the hospital for increased confusion.  She states she does not know what happened to him while he was hospitalized.  When I look at the record it looks like he received 5 days of remdesivir and Olumiant. He was started on Decadron and sent home with 5 days of same.  He was set up with home oxygen at 2 L/min nasal cannula which she states he will not wear.  His pulse ox is noted to be 96% on room air here in the ED.  His chest x-ray did not show any obvious pneumonia.  She states that on September 17 he started complaining of back pain.  She denies any known injury or fall.  He has had back pain before possibly 15 years ago and he states that L4 level and he was supposed to have spinal injections which he did not follow through with.  Patient states "it hurts".  Wife states he was plucking at his feet today saying he had strings to his toes and she caught him with a big knife getting ready to cut off his toes or to cut the strings off his toes.  He states the pain radiates down to his knees bilaterally.  Wife states he has neuropathy.  Patient states it hurts when he moves however he is noted to be moving fairly easily.  He has had prior cervical spine fusion at 3/4.  When asking where he hurts patient points to his lower back.  He also at some point states he has chest pain when he breathes.  Per chart patient has had his Pfizer  vaccine  PCP Patient, No Pcp Per    Past Medical History:  Diagnosis Date  . Anxiety   . Arthritis   . Asthma   . Cataract    Right eye  . Chronic lower back pain   . Cirrhosis of liver ([place])   . Coronary atherosclerosis of native coronary artery    a. LAD & OM stenting;  b. 2007 Cath: nonobs dzs, patent stents;  c. 07/2012 neg MV, EF 61%.  d. 11/20/14: [place] s/p  PCI w/ DES to pLAD and DES to OM1   . Dementia ([place])   . Essential hypertension   . GERD (gastroesophageal reflux disease)   . Hearing loss of left ear   . History of gout   . History of hiatal hernia   . Hypercholesteremia   . Lumbar herniated disc   . MI, old   . Migraine   . OSA (obstructive sleep apnea)   . 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 Thrombocytopenia ([place])   . TIA (transient ischemic attack) ~ 2010  . Type 2 diabetes mellitus ([place])  Patient Active Problem List   Diagnosis Date Noted  . 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) ([place]) 02/19/2020  . Hepatic cirrhosis ([place]) 09/13/2019  . GERD (gastroesophageal reflux disease) 09/13/2019  . Dysphagia 09/13/2019  . DDD (degenerative disc disease), cervical 04/13/2019  . RUQ abdominal pain 11/28/2018  . Calculus of gallbladder with acute cholecystitis without obstruction   . Fatty liver   . Acute diastolic CHF (congestive heart failure) ([place]) 11/26/2018  . Right upper quadrant abdominal pain 11/26/2018  . Acute respiratory failure with hypoxia ([place]) 11/26/2018  . NASH (nonalcoholic steatohepatitis)   . Pancytopenia, acquired ([place])   . Acute kidney injury ([place]) 11/20/2018  . Aphasia 07/09/2018  . Cerebral thrombosis with cerebral infarction 07/09/2018  . Chest pain 08/10/2017  . 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 Coronary artery disease involving native coronary artery with unstable angina pectoris ([place])   . CAD -S/P PCI LAD OM/LAD '13 and 2016 12/31/2015    . Hyperlipidemia   . Chest pain with high risk of acute coronary syndrome 12/23/2015  . Precordial chest pain 12/03/2014  . TIA (transient ischemic attack)   . CAD- diffuse LAD disease at cath 12/30/15   . Thrombocytopenia ([place])   . Fever 12/04/2013  . Accelerating angina ([place]) 12/03/2013  . Type 2 diabetes mellitus ([place])   . Anxiety   . PVC's (premature ventricular contractions) 01/06/2013  . Obstructive sleep apnea-declines C-pap 07/04/2012  . Mixed hyperlipidemia 07/30/2009  . Essential hypertension, benign 07/30/2009    Past Surgical History:  Procedure Laterality Date  . ANTERIOR CERVICAL DECOMP/DISCECTOMY FUSION  1998   "C3-4"  . CARDIAC CATHETERIZATION  "several"  . CARDIAC CATHETERIZATION N/A 12/30/2015   Procedure: Left Heart Cath and Coronary Angiography;  Surgeon: [name], MD;  Location: [facility];  Service: Cardiovascular;  Laterality: N/A;  . CARPAL TUNNEL RELEASE Bilateral 2005  . CHOLECYSTECTOMY N/A 11/28/2018   Procedure: LAPAROSCOPIC CHOLECYSTECTOMY;  Surgeon: [name], MD;  Location: [facility];  Service: General;  Laterality: N/A;  . COLONOSCOPY    . CORONARY ANGIOPLASTY    . CORONARY ANGIOPLASTY WITH STENT PLACEMENT  2002; 2003; 11/20/2014   "I have 4 stents after today" (11/20/2014)  . CORONARY ARTERY BYPASS GRAFT N/A 01/09/2016   Procedure: CORONARY ARTERY BYPASS GRAFTING (CABG) X 3 UTILIZING RIGHT AND LEFT INTERNAL MAMMARY ARTERY AND ENDOSCOPICALLY HARVESTED SAPHENEOUS VEIN.;  Surgeon: Rexene Alberts, MD;  Location: [facility];  Service: Open Heart Surgery;  Laterality: N/A;  . ESOPHAGOGASTRODUODENOSCOPY    . KNEE SURGERY Left 02/2012   "scraped; open"  . LEFT HEART CATH AND CORS/GRAFTS ANGIOGRAPHY N/A 08/09/2017   Procedure: LEFT HEART CATH AND CORS/GRAFTS ANGIOGRAPHY;  Surgeon: [name], MD;  Location: [facility];  Service: Cardiovascular;  Laterality: N/A;  . LEFT HEART CATH AND CORS/GRAFTS ANGIOGRAPHY N/A 11/22/2018    Procedure: LEFT HEART CATH AND CORS/GRAFTS ANGIOGRAPHY;  Surgeon: [name], [name], MD;  Location: [facility];  Service: Cardiovascular;  Laterality: N/A;  . LEFT HEART CATHETERIZATION WITH CORONARY ANGIOGRAM N/A 07/20/2012   Procedure: LEFT HEART CATHETERIZATION WITH CORONARY ANGIOGRAM;  Surgeon: [name], MD;  Location: [facility] CATH LAB;  Service: Cardiovascular;  Laterality: N/A;  . LEFT HEART CATHETERIZATION WITH CORONARY ANGIOGRAM N/A 11/20/2014   Procedure: LEFT HEART CATHETERIZATION WITH CORONARY ANGIOGRAM;  Surgeon: [name], MD;  Location: [facility] CATH LAB;  Service: Cardiovascular;  Laterality: N/A;  . LEFT HEART CATHETERIZATION WITH CORONARY ANGIOGRAM N/A  11/26/2014   Procedure: LEFT HEART CATHETERIZATION WITH CORONARY ANGIOGRAM;  Surgeon: [name], MD;  Location: [facility] CATH LAB;  Service: Cardiovascular;  Laterality: N/A;  . NEUROPLASTY / TRANSPOSITION ULNAR NERVE AT ELBOW Right ~ 2012  . PERCUTANEOUS CORONARY ROTOBLATOR INTERVENTION (PCI-R)  11/20/2014   Procedure: PERCUTANEOUS CORONARY ROTOBLATOR INTERVENTION (PCI-R);  Surgeon: [name], MD;  Location: [facility] CATH LAB;  Service: Cardiovascular;;  . SHOULDER ARTHROSCOPY Left ~ 2011  . TEE WITHOUT CARDIOVERSION N/A 01/09/2016   Procedure: TRANSESOPHAGEAL ECHOCARDIOGRAM (TEE);  Surgeon: Rexene Alberts, MD;  Location: [facility];  Service: Open Heart Surgery;  Laterality: N/A;       Family History  Problem Relation Age of Onset  . Stroke Mother   . Coronary artery disease Father 76  . Asthma Sister   . Multiple sclerosis Brother   . Heart disease Paternal Uncle   . Stroke Maternal Grandmother   . Heart attack Paternal Grandmother   . Cancer Paternal Grandfather   . Asthma Sister   . Seizures Son   . Migraines Son   . Autism Son   . Migraines Son     Social History   Tobacco Use  . Smoking status: Never Smoker  . Smokeless tobacco: Never Used  Vaping Use  . Vaping Use: Never used  Substance Use Topics  .  Alcohol use: No  . Drug use: No  Lives at home Lives with wife  Home Medications Prior to Admission medications   Medication Sig Start Date End Date Taking? Authorizing Provider  albuterol (PROVENTIL HFA;VENTOLIN HFA) 108 (90 BASE) MCG/ACT inhaler Inhale 2 puffs into the lungs every 6 (six) hours as needed for wheezing or shortness of breath. Patient not taking: Reported on 06/05/2020    [provider]  amLODipine (NORVASC) 5 MG tablet Take 5 mg by mouth at bedtime. 06/16/18   [provider]  aspirin EC 81 MG tablet Take 81 mg by mouth at bedtime.     [provider]  citalopram (CELEXA) 20 MG tablet Take 20 mg by mouth at bedtime.     [provider]  cyclobenzaprine (FLEXERIL) 5 MG tablet Take 1 tablet (5 mg total) by mouth 3 (three) times daily as needed (for back muscle pain). 07/27/20   Rolland Porter, MD  diazepam (VALIUM) 5 MG tablet Take 5 mg by mouth at bedtime. *May take one tablet up to three times daily as needed for anxiety    [provider]  ergocalciferol (VITAMIN D2) 1.25 MG (50000 UT) capsule Take 1 capsule (50,000 Units total) by mouth once a week. 05/03/19   Lockamy, Randi L, NP-C  ezetimibe (ZETIA) 10 MG tablet Take 10 mg by mouth daily.    [provider]  ferrous sulfate 325 (65 FE) MG EC tablet Take 325 mg by mouth once a week.     [provider]  fluticasone (FLONASE) 50 MCG/ACT nasal spray Place 1 spray into the nose 2 (two) times daily.     [provider]  Fluticasone-Salmeterol (ADVAIR) 250-50 MCG/DOSE AEPB Inhale 1 puff into the lungs every 12 (twelve) hours.    [provider]  furosemide (LASIX) 20 MG tablet Take 20 mg by mouth daily. 10/14/19   [provider]  gabapentin (NEURONTIN) 300 MG capsule Take 300 mg by mouth 3 (three) times daily. 07/09/17   [provider]  insulin aspart (NOVOLOG FLEXPEN) 100 UNIT/ML FlexPen Inject 2-30 Units into the skin 3 (three) times  daily  as needed for high blood sugar (CBG >150). Per sliding scale written down at home     [provider]  isosorbide mononitrate (IMDUR) 30 MG 24 hr tablet Take 30 mg by mouth at bedtime. 06/16/18   [provider]  Melatonin 5 MG TABS Take 10 mg by mouth at bedtime.     [provider]  metFORMIN (GLUCOPHAGE) 1000 MG tablet Take 1 tablet (1,000 mg total) by mouth 2 (two) times daily. For the next 2 days as you did receive IV contrast, then resume on 11/26/2018 Patient taking differently: Take 1,000 mg by mouth 2 (two) times daily.  11/23/18   Elgergawy, Silver Huguenin, MD  metoprolol tartrate (LOPRESSOR) 50 MG tablet Take 50 mg by mouth 2 (two) times daily.     [provider]  nitroGLYCERIN (NITROSTAT) 0.4 MG SL tablet Place 1 tablet (0.4 mg total) under the tongue every 5 (five) minutes x 3 doses as needed for chest pain (if no relief after 3rd dose, proceed to the ED for an evaluation or call 911). 04/10/19 04/19/20  [name], MD  omeprazole (PRILOSEC) 20 MG capsule Take 20 mg by mouth 2 (two) times daily. 04/18/20   [provider]  potassium chloride SA (KLOR-CON) 20 MEQ tablet Take 1 tablet (20 mEq total) by mouth 2 (two) times daily. 07/27/20   Rolland Porter, MD  pravastatin (PRAVACHOL) 20 MG tablet Take 1 tablet (20 mg total) by mouth daily. 02/20/20 04/19/20  Manuella Ghazi, Pratik D, DO  topiramate (TOPAMAX) 50 MG tablet Take 50 mg by mouth 2 (two) times daily. 04/18/20   [provider]  TRESIBA FLEXTOUCH 100 UNIT/ML SOPN FlexTouch Pen Inject 70 Units into the skin at bedtime.  08/11/18   [provider]  vitamin B-12 (CYANOCOBALAMIN) 1000 MCG tablet 1 tab(s)    [provider]    Allergies    Divalproex sodium, Statins, Tramadol, Valproic acid, Other, Gadolinium derivatives, and Tricor [fenofibrate]  Review of Systems   Review of Systems  All other systems reviewed and are negative.   Physical Exam Updated Vital Signs BP  132/72   Pulse 89   Temp 98.6 F (37 C) (Oral)   Resp (!) 24   Ht 5' 10"  (1.778 m)   Wt 95.3 kg   SpO2 94%   BMI 30.13 kg/m   Physical Exam Vitals and nursing note reviewed.  Constitutional:      General: He is not in acute distress.    Appearance: Normal appearance. He is obese.  HENT:     Head: Normocephalic and atraumatic.     Right Ear: External ear normal.     Left Ear: External ear normal.  Eyes:     Extraocular Movements: Extraocular movements intact.     Conjunctiva/sclera: Conjunctivae normal.  Cardiovascular:     Rate and Rhythm: Normal rate and regular rhythm.     Pulses: Normal pulses.     Heart sounds: Normal heart sounds.  Pulmonary:     Effort: Pulmonary effort is normal. No respiratory distress.     Breath sounds: Normal breath sounds.  Abdominal:     General: Abdomen is flat. Bowel sounds are normal.     Palpations: Abdomen is soft.     Tenderness: There is abdominal tenderness. There is no guarding or rebound.       Comments: Patient has tenderness over his left flank to percussion, he has less tenderness over the right flank area.  Musculoskeletal:  General: Normal range of motion.     Cervical back: Normal range of motion and neck supple.     Comments: Patient is nontender in his lumbar spine when I palpate it.  He appears to have some pain in his thoracic spine.  However his exam is very inconsistent.  Skin:    General: Skin is warm and dry.     Findings: No rash.  Neurological:     General: No focal deficit present.     Mental Status: He is alert.     Cranial Nerves: No cranial nerve deficit.  Psychiatric:        Mood and Affect: Mood normal.     ED Results / Procedures / Treatments   Labs (all labs ordered are listed, but only abnormal results are displayed)  Results for orders placed or performed during the hospital encounter of 07/27/20  Urinalysis, Routine w reflex microscopic Urine, Clean Catch  Result Value Ref Range    Color, Urine YELLOW YELLOW   APPearance CLEAR CLEAR   Specific Gravity, Urine 1.024 1.005 - 1.030   pH 5.0 5.0 - 8.0   Glucose, UA NEGATIVE NEGATIVE mg/dL   Hgb urine dipstick NEGATIVE NEGATIVE   Bilirubin Urine NEGATIVE NEGATIVE   Ketones, ur 20 (A) NEGATIVE mg/dL   Protein, ur 30 (A) NEGATIVE mg/dL   Nitrite NEGATIVE NEGATIVE   Leukocytes,Ua NEGATIVE NEGATIVE   RBC / HPF 0-5 0 - 5 RBC/hpf   WBC, UA 0-5 0 - 5 WBC/hpf   Bacteria, UA NONE SEEN NONE SEEN   Squamous Epithelial / LPF 0-5 0 - 5   Mucus PRESENT   Comprehensive metabolic panel  Result Value Ref Range   Sodium 139 135 - 145 mmol/L   Potassium 3.2 (L) 3.5 - 5.1 mmol/L   Chloride 108 98 - 111 mmol/L   CO2 22 22 - 32 mmol/L   Glucose, Bld 186 (H) 70 - 99 mg/dL   BUN 23 8 - 23 mg/dL   Creatinine, Ser 1.04 0.61 - 1.24 mg/dL   Calcium 8.5 (L) 8.9 - 10.3 mg/dL   Total Protein 7.2 6.5 - 8.1 g/dL   Albumin 3.4 (L) 3.5 - 5.0 g/dL   AST 46 (H) 15 - 41 U/L   ALT 49 (H) 0 - 44 U/L   Alkaline Phosphatase 46 38 - 126 U/L   Total Bilirubin 1.3 (H) 0.3 - 1.2 mg/dL   GFR calc non Af Amer >60 >60 mL/min   GFR calc Af Amer >60 >60 mL/min   Anion gap 9 5 - 15  CBC with Differential  Result Value Ref Range   WBC 4.9 4.0 - 10.5 K/uL   RBC 5.12 4.22 - 5.81 MIL/uL   Hemoglobin 16.1 13.0 - 17.0 g/dL   HCT 45.7 39 - 52 %   MCV 89.3 80.0 - 100.0 fL   MCH 31.4 26.0 - 34.0 pg   MCHC 35.2 30.0 - 36.0 g/dL   RDW 13.0 11.5 - 15.5 %   Platelets 114 (L) 150 - 400 K/uL   nRBC 0.0 0.0 - 0.2 %   Neutrophils Relative % 76 %   Neutro Abs 3.7 1.7 - 7.7 K/uL   Lymphocytes Relative 14 %   Lymphs Abs 0.7 0.7 - 4.0 K/uL   Monocytes Relative 8 %   Monocytes Absolute 0.4 0 - 1 K/uL   Eosinophils Relative 1 %   Eosinophils Absolute 0.1 0 - 0 K/uL   Basophils Relative 0 %  Basophils Absolute 0.0 0 - 0 K/uL   Immature Granulocytes 1 %   Abs Immature Granulocytes 0.05 0.00 - 0.07 K/uL  Lipase, blood  Result Value Ref Range   Lipase 42 11 - 51 U/L   D-dimer, quantitative  Result Value Ref Range   D-Dimer, Quant 0.42 0.00 - 0.50 ug/mL-FEU  Troponin I (High Sensitivity)  Result Value Ref Range   Troponin I (High Sensitivity) 6 <18 ng/L   Laboratory interpretation all normal except  Hypokalemia, stable thrombocytopenia   EKG EKG Interpretation  Date/Time:  Saturday July 27 2020 05:21:40 EDT Ventricular Rate:  83 PR Interval:    QRS Duration: 96 QT Interval:  407 QTC Calculation: 479 R Axis:   63 Text Interpretation: Sinus rhythm Borderline repolarization abnormality Borderline prolonged QT interval Since last tracing rate faster 19 Feb 2020 Confirmed by Rolland Porter [phone]) on 07/27/2020 6:06:22 AM   Radiology DG Chest Port 1 View  Result Date: 07/27/2020 CLINICAL DATA:  Chest pain EXAM: PORTABLE CHEST 1 VIEW COMPARISON:  07/21/2020 FINDINGS: Bilateral airspace opacities. No pleural effusion or pneumothorax. Remote median sternotomy. Normal cardiomediastinal contours. IMPRESSION: Bilateral airspace opacities, likely viral pneumonia. Electronically Signed   By: Ulyses Jarred M.D.   On: 07/27/2020 06:01   CT Renal Stone Study  Result Date: 07/27/2020 CLINICAL DATA:  Flank pain EXAM: CT ABDOMEN AND PELVIS WITHOUT CONTRAST TECHNIQUE: Multidetector CT imaging of the abdomen and pelvis was performed following the standard protocol without IV contrast. COMPARISON:  11/23/2018 FINDINGS: LOWER CHEST: Multifocal bibasilar ground-glass opacity. HEPATOBILIARY: Normal hepatic contours. No intra- or extrahepatic biliary dilatation. Status post cholecystectomy. PANCREAS: Normal pancreas. No ductal dilatation or peripancreatic fluid collection. SPLEEN: Normal. ADRENALS/URINARY TRACT: The adrenal glands are normal. No hydronephrosis, nephroureterolithiasis or solid renal mass. The urinary bladder is normal for degree of distention STOMACH/BOWEL: There is no hiatal hernia. Normal duodenal course and caliber. No small bowel dilatation or  inflammation. No focal colonic abnormality. The appendix is not visualized. No right lower quadrant inflammation or free fluid. VASCULAR/LYMPHATIC: There is calcific atherosclerosis of the abdominal aorta. No lymphadenopathy. REPRODUCTIVE: Normal prostate size with symmetric seminal vesicles. MUSCULOSKELETAL. No bony spinal canal stenosis or focal osseous abnormality. Flowing anterior enthesophytes. OTHER: None. IMPRESSION: 1. No acute abnormality of the abdomen or pelvis. 2. Multifocal bibasilar ground-glass opacity, likely viral pneumonia. Aortic Atherosclerosis (ICD10-I70.0). Electronically Signed   By: Ulyses Jarred M.D.   On: 07/27/2020 06:00    Procedures Procedures (including critical care time)  Medications Ordered in ED Medications  methocarbamol (ROBAXIN) tablet 500 mg (500 mg Oral Given 07/27/20 0435)  ketorolac (TORADOL) 30 MG/ML injection 30 mg (30 mg Intramuscular Given 07/27/20 0435)    ED Course  I have reviewed the triage vital signs and the nursing notes.  Pertinent labs & imaging results that were available during my care of the patient were reviewed by me and considered in my medical decision making (see chart for details).    MDM Rules/Calculators/A&P                          Patient presented with confusing symptoms because he has some underlying confusion since he had a stroke.  He was pointing to his lower back but by exam his pain was in his upper back.  Chest x-ray was done.  He had some flank tenderness seems worse on the left than the right and some mild lower abdominal tenderness.  CT renal was done which would  show he had a aortic aneurysm and also if he had a renal stone.  The CT renal did not show any acute findings.  Patient was given Robaxin and Toradol IM for pain.  I went back to talk to wife after his x-ray studies were resulted but his blood work was still pending.  We discussed that his x-ray does show the Covid pneumonia.  She states he was not given any  prescriptions for Decadron when he left the hospital however reading the note it is clearly written that it was prescribed.  I suggested that she check at her drugstore it may be they are waiting to be picked up.  We also discussed the steroids would help with his back pain.  Pt's labs showed his stable thrombocytopenia and hypokalemia. He was discharged home.   Final Clinical Impression(s) / ED Diagnoses Final diagnoses:  Flank pain, acute  Acute back pain, unspecified back location, unspecified back pain laterality  Hypokalemia    Rx / DC Orders ED Discharge Orders         Ordered    cyclobenzaprine (FLEXERIL) 5 MG tablet  3 times daily PRN        07/27/20 0732    potassium chloride SA (KLOR-CON) 20 MEQ tablet  2 times daily        07/27/20 0734         Plan discharge  Rolland Porter, MD, Barbette Or, MD 07/27/20 [phone]

## 2020-07-30 ENCOUNTER — Encounter (INDEPENDENT_AMBULATORY_CARE_PROVIDER_SITE_OTHER): Payer: Self-pay

## 2020-07-31 ENCOUNTER — Ambulatory Visit (INDEPENDENT_AMBULATORY_CARE_PROVIDER_SITE_OTHER): Payer: Medicare Other | Admitting: Gastroenterology

## 2020-08-12 ENCOUNTER — Ambulatory Visit (INDEPENDENT_AMBULATORY_CARE_PROVIDER_SITE_OTHER): Payer: Medicare Other | Admitting: Gastroenterology

## 2020-08-15 ENCOUNTER — Encounter (INDEPENDENT_AMBULATORY_CARE_PROVIDER_SITE_OTHER): Payer: Self-pay | Admitting: Gastroenterology

## 2020-08-15 ENCOUNTER — Encounter (INDEPENDENT_AMBULATORY_CARE_PROVIDER_SITE_OTHER): Payer: Self-pay | Admitting: *Deleted

## 2020-08-15 ENCOUNTER — Other Ambulatory Visit: Payer: Self-pay

## 2020-08-15 ENCOUNTER — Telehealth (INDEPENDENT_AMBULATORY_CARE_PROVIDER_SITE_OTHER): Payer: Self-pay | Admitting: *Deleted

## 2020-08-15 ENCOUNTER — Ambulatory Visit (INDEPENDENT_AMBULATORY_CARE_PROVIDER_SITE_OTHER): Payer: Medicare Other | Admitting: Gastroenterology

## 2020-08-15 VITALS — BP 104/70 | HR 79 | Temp 98.5°F | Ht 69.0 in | Wt 213.9 lb

## 2020-08-15 DIAGNOSIS — K7460 Unspecified cirrhosis of liver: Secondary | ICD-10-CM | POA: Diagnosis not present

## 2020-08-15 DIAGNOSIS — K7581 Nonalcoholic steatohepatitis (NASH): Secondary | ICD-10-CM

## 2020-08-15 MED ORDER — PLENVU 140 G PO SOLR: 1.0000 | Freq: Once | ORAL | 0 refills | Status: AC

## 2020-08-15 NOTE — Telephone Encounter (Signed)
Patient needs Plenvu (copay card)

## 2020-08-15 NOTE — Progress Notes (Signed)
[name] [name], M.D. Gastroenterology & Hepatology [facility] [address] [place], [place] [address]  Primary Care Physician: [name] [name], No Pcp Per No address on file  I will communicate my assessment and recommendations to the referring MD via EMR. "Note: Occasional unusual wording and randomly placed punctuation marks may result from the use of speech recognition technology to transcribe this document"  Problems: 1. NASH cirrhosis  History of Present Illness: [name] [name] is a 64 y.o. male w with past medical history of recent COVID-19 infection, myocardial infarction status post CABG, anxiety, asthma, [name] [name] cirrhosis, hyperlipidemia, OSA, and type 2 diabetes, who presents for follow up of NASH cirrhosis and to undergo colorectal cancer screening.  The [name] [name] was last seen on 08/13/2019. At that time, the [name] [name] was ordered to undergo an EGD, colonoscopy, ultrasound and follow-up labs. Most recent available labs are from 07/27/2020 which showed a normal creatinine of 1.04, BUN 23, presence of mild hypokalemia 3.2 with rest of electrolytes within normal limits, mild hypoalbuminemia of 3.4, mildly elevated AST of 46, ALT 49, total bili 1.3, alkaline phosphatase 46, CBC with low platelets 114, normal white blood cell count 4.9 and hemoglobin 16.1.  Most recent AFP was 2.3 on 09/13/2019.  Most recent INR was from 02/20/2020 of 1.2.  His most recent abdominal imaging is a CT abdomen without IV contrast on 07/27/2020 which was within normal limits besides presence of bibasilar groundglass opacities.  The [name] [name] states that 3 weeks ago he was diagnosed with COVID-19 infection for which she had to be hospitalized.  States that he has fully recovered of the infection but due to the severity of the infection he has lost close to 25 pounds.  He states that he received 1 dose of his COVID vaccine.  The [name] [name] had not received vaccination against  hepatitis A or B, serologies negative for any antibiotics for these infections.  Currently, the [name] [name] denies having any complaints and states he came to the clinic to establish care and undergo monitoring of his liver disease. The [name] [name] denies having any nausea, vomiting, fever, chills, hematochezia, melena, hematemesis, abdominal distention, abdominal pain, diarrhea, jaundice, pruritus or weight loss.  Last EGD: Never Last Colonoscopy: 10 years ago per the [name] [name], reports it was normal.  The [name] [name] denies drinking any alcohol.  Past Medical History: Past Medical History:  Diagnosis Date  . Anxiety   . Arthritis   . Asthma   . Cataract    Right eye  . Chronic lower back pain   . Cirrhosis of liver ([place])   . Coronary atherosclerosis of native coronary artery    a. LAD & OM stenting;  b. 2007 Cath: nonobs dzs, patent stents;  c. 07/2012 neg MV, EF 61%.  d. 11/20/14: [place] s/p  PCI w/ DES to pLAD and DES to OM1   . Dementia ([place])   . Essential hypertension   . GERD (gastroesophageal reflux disease)   . Hearing loss of left ear   . History of gout   . History of hiatal hernia   . Hypercholesteremia   . Lumbar herniated disc   . MI, old   . Migraine   . OSA (obstructive sleep apnea)   . 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 Thrombocytopenia ([place])   . TIA (transient ischemic attack) ~ 2010  . Type 2 diabetes mellitus ([place])  Past Surgical History: Past Surgical History:  Procedure Laterality Date  . ANTERIOR CERVICAL DECOMP/DISCECTOMY FUSION  1998   "C3-4"  . CARDIAC CATHETERIZATION  "several"  . CARDIAC CATHETERIZATION N/A 12/30/2015   Procedure: Left Heart Cath and Coronary Angiography;  Surgeon: [name], MD;  Location: [place] [facility];  Service: Cardiovascular;  Laterality: N/A;  . CARPAL TUNNEL RELEASE Bilateral 2005  . CHOLECYSTECTOMY N/A 11/28/2018   Procedure: LAPAROSCOPIC  CHOLECYSTECTOMY;  Surgeon: [name], MD;  Location: [facility];  Service: General;  Laterality: N/A;  . COLONOSCOPY    . CORONARY ANGIOPLASTY    . CORONARY ANGIOPLASTY WITH STENT PLACEMENT  2002; 2003; 11/20/2014   "I have 4 stents after today" (11/20/2014)  . CORONARY ARTERY BYPASS GRAFT N/A 01/09/2016   Procedure: CORONARY ARTERY BYPASS GRAFTING (CABG) X 3 UTILIZING RIGHT AND LEFT INTERNAL MAMMARY ARTERY AND ENDOSCOPICALLY HARVESTED SAPHENEOUS VEIN.;  Surgeon: Rexene Alberts, MD;  Location: [place];  Service: Open Heart Surgery;  Laterality: N/A;  . ESOPHAGOGASTRODUODENOSCOPY    . KNEE SURGERY Left 02/2012   "scraped; open"  . LEFT HEART CATH AND CORS/GRAFTS ANGIOGRAPHY N/A 08/09/2017   Procedure: LEFT HEART CATH AND CORS/GRAFTS ANGIOGRAPHY;  Surgeon: [name], MD;  Location: [place] [facility];  Service: Cardiovascular;  Laterality: N/A;  . LEFT HEART CATH AND CORS/GRAFTS ANGIOGRAPHY N/A 11/22/2018   Procedure: LEFT HEART CATH AND CORS/GRAFTS ANGIOGRAPHY;  Surgeon: [name], [name], MD;  Location: [place] [facility];  Service: Cardiovascular;  Laterality: N/A;  . LEFT HEART CATHETERIZATION WITH CORONARY ANGIOGRAM N/A 07/20/2012   Procedure: LEFT HEART CATHETERIZATION WITH CORONARY ANGIOGRAM;  Surgeon: [name], MD;  Location: [place] CATH LAB;  Service: Cardiovascular;  Laterality: N/A;  . LEFT HEART CATHETERIZATION WITH CORONARY ANGIOGRAM N/A 11/20/2014   Procedure: LEFT HEART CATHETERIZATION WITH CORONARY ANGIOGRAM;  Surgeon: [name], MD;  Location: [facility] CATH LAB;  Service: Cardiovascular;  Laterality: N/A;  . LEFT HEART CATHETERIZATION WITH CORONARY ANGIOGRAM N/A 11/26/2014   Procedure: LEFT HEART CATHETERIZATION WITH CORONARY ANGIOGRAM;  Surgeon: [name], MD;  Location: [facility] CATH LAB;  Service: Cardiovascular;  Laterality: N/A;  . NEUROPLASTY / TRANSPOSITION ULNAR NERVE AT ELBOW Right ~ 2012  . PERCUTANEOUS CORONARY ROTOBLATOR INTERVENTION (PCI-R)  11/20/2014   Procedure:  PERCUTANEOUS CORONARY ROTOBLATOR INTERVENTION (PCI-R);  Surgeon: [name], MD;  Location: [facility] CATH LAB;  Service: Cardiovascular;;  . SHOULDER ARTHROSCOPY Left ~ 2011  . TEE WITHOUT CARDIOVERSION N/A 01/09/2016   Procedure: TRANSESOPHAGEAL ECHOCARDIOGRAM (TEE);  Surgeon: Rexene Alberts, MD;  Location: [place];  Service: Open Heart Surgery;  Laterality: N/A;    Family History: Family History  Problem Relation Age of Onset  . Stroke Mother   . Coronary artery disease Father 63  . Asthma Sister   . Multiple sclerosis Brother   . Heart disease Paternal Uncle   . Stroke Maternal Grandmother   . Heart attack Paternal Grandmother   . Cancer Paternal Grandfather   . Asthma Sister   . Seizures Son   . Migraines Son   . Autism Son   . Migraines Son     Social History: Social History   Tobacco Use  Smoking Status Never Smoker  Smokeless Tobacco Never Used   Social History   Substance and Sexual Activity  Alcohol Use No   Social History   Substance and Sexual Activity  Drug Use No    Allergies: Allergies  Allergen Reactions  . Divalproex Sodium Other (  See Comments)    Causes anger  . Statins Other (See Comments)    Muscle aches and cramps  . Tramadol Other (See Comments)    Chest pain   . Valproic Acid Other (See Comments)    "anger"  . Other     Other reaction(s): Unknown  . Gadolinium Derivatives Nausea And Vomiting    07/25/19 Pt vomited immediately after IV gad. Denies itching, dyspnea.  (Adverse, not allergic, reaction  . Tricor [Fenofibrate] Other (See Comments)    Leg cramps    Medications: Current Outpatient Medications  Medication Sig Dispense Refill  . albuterol (PROVENTIL HFA;VENTOLIN HFA) 108 (90 BASE) MCG/ACT inhaler Inhale 2 puffs into the lungs every 6 (six) hours as needed for wheezing or shortness of breath.     [name] amLODipine (NORVASC) 5 MG tablet Take 5 mg by mouth at bedtime.  4  . aspirin EC 81 MG tablet Take 81 mg by mouth at bedtime.     .  citalopram (CELEXA) 20 MG tablet Take 20 mg by mouth at bedtime.     . ergocalciferol (VITAMIN D2) 1.25 MG (50000 UT) capsule Take 1 capsule (50,000 Units total) by mouth once a week. 20 capsule 1  . ezetimibe (ZETIA) 10 MG tablet Take 10 mg by mouth daily.    . ferrous sulfate 325 (65 FE) MG EC tablet Take 325 mg by mouth once a week.     . fluticasone (FLONASE) 50 MCG/ACT nasal spray Place 1 spray into the nose 2 (two) times daily.     . Fluticasone-Salmeterol (ADVAIR) 250-50 MCG/DOSE AEPB Inhale 1 puff into the lungs every 12 (twelve) hours.    . furosemide (LASIX) 20 MG tablet Take 20 mg by mouth daily.    [name] gabapentin (NEURONTIN) 300 MG capsule Take 300 mg by mouth 3 (three) times daily.  2  . insulin aspart (NOVOLOG FLEXPEN) 100 UNIT/ML FlexPen Inject 2-30 Units into the skin 3 (three) times daily as needed for high blood sugar (CBG >150). Per sliding scale written down at home     . isosorbide mononitrate (IMDUR) 30 MG 24 hr tablet Take 30 mg by mouth at bedtime.  4  . Melatonin 5 MG TABS Take 10 mg by mouth at bedtime.     . metFORMIN (GLUCOPHAGE) 1000 MG tablet Take 1 tablet (1,000 mg total) by mouth 2 (two) times daily. For the next 2 days as you did receive IV contrast, then resume on 11/26/2018 ([name] [name] taking differently: Take 1,000 mg by mouth 2 (two) times daily. ) 60 tablet 11  . methocarbamol (ROBAXIN) 500 MG tablet Take 1 or 2 po Q 6hrs for pain 60 tablet 0  . metoprolol tartrate (LOPRESSOR) 50 MG tablet Take 50 mg by mouth 2 (two) times daily.     [name] omeprazole (PRILOSEC) 20 MG capsule Take 20 mg by mouth 2 (two) times daily.    [name] topiramate (TOPAMAX) 50 MG tablet Take 50 mg by mouth 2 (two) times daily.    [name] FLEXTOUCH 100 UNIT/ML SOPN FlexTouch Pen Inject 70 Units into the skin at bedtime.   5  . vitamin B-12 (CYANOCOBALAMIN) 1000 MCG tablet 1 tab(s)    . zinc gluconate 50 MG tablet Take 50 mg by mouth daily.    . diazepam (VALIUM) 5 MG tablet Take 5 mg by mouth at  bedtime. *May take one tablet up to three times daily as needed for anxiety ([name] [name] not taking: Reported on 08/15/2020)    .  nitroGLYCERIN (NITROSTAT) 0.4 MG SL tablet Place 1 tablet (0.4 mg total) under the tongue every 5 (five) minutes x 3 doses as needed for chest pain (if no relief after 3rd dose, proceed to the ED for an evaluation or call 911). 25 tablet 3  . PEG-KCl-NaCl-NaSulf-Na Asc-C (PLENVU) 140 g SOLR Take 1 kit by mouth once for 1 dose. 1 each 0  . potassium chloride SA (KLOR-CON) 20 MEQ tablet Take 1 tablet (20 mEq total) by mouth 2 (two) times daily. 10 tablet 0  . pravastatin (PRAVACHOL) 20 MG tablet Take 1 tablet (20 mg total) by mouth daily. 30 tablet 0   No current facility-administered medications for this visit.    Review of Systems: GENERAL: negative for malaise, night sweats HEENT: No changes in hearing or vision, no nose bleeds or other nasal problems. NECK: Negative for lumps, goiter, pain and significant neck swelling RESPIRATORY: Negative for cough, wheezing CARDIOVASCULAR: Negative for chest pain, leg swelling, palpitations, orthopnea GI: SEE HPI MUSCULOSKELETAL: Negative for joint pain or swelling, back pain, and muscle pain. SKIN: Negative for lesions, rash PSYCH: Negative for sleep disturbance, mood disorder and recent psychosocial stressors. HEMATOLOGY Negative for prolonged bleeding, bruising easily, and swollen nodes. ENDOCRINE: Negative for cold or heat intolerance, polyuria, polydipsia and goiter. NEURO: negative for tremor, gait imbalance, syncope and seizures. The remainder of the review of systems is noncontributory.   Physical Exam: BP 104/70 (BP Location: Right Arm, [name] [name] Position: Sitting, Cuff Size: Normal)   Pulse 79   Temp 98.5 F (36.9 C) (Oral)   Ht 5' 9"  (1.753 m)   Wt 213 lb 14.4 oz (97 kg)   BMI 31.59 kg/m  GENERAL: The [name] [name] is AO x3, in no acute distress. Obese. HEENT: Head is normocephalic and atraumatic. EOMI are intact.  Mouth is well hydrated and without lesions. NECK: Supple. No masses CHEST: mild spider angiomata in his chest. LUNGS: Clear to auscultation. No presence of rhonchi/wheezing/rales. Adequate chest expansion HEART: RRR, normal s1 and s2. ABDOMEN: Soft, nontender, no guarding, no peritoneal signs, and nondistended. BS +. No masses. EXTREMITIES: Without any cyanosis, clubbing, rash, lesions or edema. NEUROLOGIC: AOx3, no focal motor deficit. SKIN: no jaundice, no rashes  Imaging/Labs: as above  I personally reviewed and interpreted the available labs, imaging and endoscopic files.  Impression and Plan: FERMON URETA is a 64 y.o. male w with past medical history of recent COVID-19 infection, myocardial infarction status post CABG, anxiety, asthma, [name] [name] cirrhosis, hyperlipidemia, OSA, and type 2 diabetes, who presents for follow up of NASH cirrhosis and to undergo colorectal cancer screening.  In terms of his liver cirrhosis, the [name] [name] has a low MELD score of 10 and has not presented any decompensating events, he is a class A CPT [name] [name].  He is due for repeat [place] screening with ultrasound and imaging, which will be ordered today, we will also obtain repeat updated MELD labs today.  I emphasized importance of requesting hepatitis B and a vaccination, as well as finishing his course of COVID vaccines which he will discuss with his primary care physician.  He is due for esophageal varices screening, for which an EGD will be ordered at this time.  The [name] [name] was encouraged to keep losing weight as this will help with the control of his [name] [name] cirrhosis, as well as adhering to his medication regimen to control his diabetes and hypertension.  Finally, the [name] [name] is due for colorectal cancer screening, for which he will be ordered a colonoscopy.  -  Schedule EGD and colonoscopy - Check MELD labs, AFP -Schedule liver US - Reduce salt intake to <2 g per day - Can take Tylenol max of 2 g per day (650 mg  q8h) for pain - Avoid NSAIDs for pain - Avoid eating raw oysters or shellfish - To request hepatitis A and B vaccine, and second shot of COVID vaccine by PCP - RTC 6 months  All questions were answered.      [name], MD Gastroenterology and Hepatology [facility]

## 2020-08-15 NOTE — Patient Instructions (Addendum)
Schedule EGD and colonoscopy Perform blood workup Schedule [place] Reduce salt intake to <2 g per day Can take Tylenol max of 2 g per day (650 mg q8h) for pain Avoid NSAIDs for pain Avoid eating raw oysters or shellfish Please ask for hepatitis A and B vaccine, and second shot of COVID vaccine to your PCP

## 2020-08-16 LAB — CBC WITH DIFFERENTIAL/PLATELET
Absolute Monocytes: 377 cells/uL (ref 200–950)
Basophils Absolute: 21 cells/uL (ref 0–200)
Basophils Relative: 0.5 %
Eosinophils Absolute: 308 cells/uL (ref 15–500)
Eosinophils Relative: 7.5 %
HCT: 44.1 % (ref 38.5–50.0)
Hemoglobin: 15.3 g/dL (ref 13.2–17.1)
Lymphs Abs: 1505 cells/uL (ref 850–3900)
MCH: 31.7 pg (ref 27.0–33.0)
MCHC: 34.7 g/dL (ref 32.0–36.0)
MCV: 91.5 fL (ref 80.0–100.0)
MPV: 9.9 fL (ref 7.5–12.5)
Monocytes Relative: 9.2 %
Neutro Abs: 1890 cells/uL (ref 1500–7800)
Neutrophils Relative %: 46.1 %
Platelets: 124 10*3/uL — ABNORMAL LOW (ref 140–400)
RBC: 4.82 10*6/uL (ref 4.20–5.80)
RDW: 13.8 % (ref 11.0–15.0)
Total Lymphocyte: 36.7 %
WBC: 4.1 10*3/uL (ref 3.8–10.8)

## 2020-08-16 LAB — COMPREHENSIVE METABOLIC PANEL
AG Ratio: 1.3 (calc) (ref 1.0–2.5)
ALT: 44 U/L (ref 9–46)
AST: 40 U/L — ABNORMAL HIGH (ref 10–35)
Albumin: 3.9 g/dL (ref 3.6–5.1)
Alkaline phosphatase (APISO): 65 U/L (ref 35–144)
BUN: 25 mg/dL (ref 7–25)
CO2: 25 mmol/L (ref 20–32)
Calcium: 9.4 mg/dL (ref 8.6–10.3)
Chloride: 105 mmol/L (ref 98–110)
Creat: 1.04 mg/dL (ref 0.70–1.25)
Globulin: 2.9 g/dL (calc) (ref 1.9–3.7)
Glucose, Bld: 103 mg/dL — ABNORMAL HIGH (ref 65–99)
Potassium: 4.7 mmol/L (ref 3.5–5.3)
Sodium: 141 mmol/L (ref 135–146)
Total Bilirubin: 0.7 mg/dL (ref 0.2–1.2)
Total Protein: 6.8 g/dL (ref 6.1–8.1)

## 2020-08-16 LAB — PROTIME-INR
INR: 1
Prothrombin Time: 11.2 s (ref 9.0–11.5)

## 2020-08-16 LAB — AFP TUMOR MARKER: AFP-Tumor Marker: 2.3 ng/mL (ref <6.1)

## 2020-08-23 ENCOUNTER — Other Ambulatory Visit: Payer: Self-pay

## 2020-08-23 ENCOUNTER — Ambulatory Visit (HOSPITAL_COMMUNITY)
Admission: RE | Admit: 2020-08-23 | Discharge: 2020-08-23 | Disposition: A | Discharge status: Home / Self Care | Payer: Medicare Other | Source: Ambulatory Visit | Attending: Gastroenterology | Admitting: Gastroenterology

## 2020-08-23 DIAGNOSIS — K7460 Unspecified cirrhosis of liver: Secondary | ICD-10-CM | POA: Insufficient documentation

## 2020-08-23 DIAGNOSIS — K7581 Nonalcoholic steatohepatitis (NASH): Secondary | ICD-10-CM | POA: Insufficient documentation

## 2020-09-13 ENCOUNTER — Other Ambulatory Visit (INDEPENDENT_AMBULATORY_CARE_PROVIDER_SITE_OTHER): Payer: Self-pay | Admitting: *Deleted

## 2020-09-20 IMAGING — NM NM HEPATOBILIARY IMAGE, INC GB
3 series · 18 of 18 positions shown · non-contrast
Comparison: Right upper quadrant abdomen ultrasound dated
11/24/2018 and abdomen and pelvis CT dated 11/23/2018.

CLINICAL DATA: Right upper quadrant abdominal pain and nausea for 1
week. The gallbladder was distended with no visible gallstones on a
recent ultrasound.

EXAM:
NUCLEAR MEDICINE HEPATOBILIARY IMAGING
TECHNIQUE: Sequential images of the abdomen were obtained [DATE] minutes
following intravenous administration of radiopharmaceutical.
RADIOPHARMACEUTICALS:  4.75 mCi Uc-NNm  Choletec IV

[Series 1: biliary · 3.25mm/px · 6 of 60 frames shown (1 of 2)]
[frame 6/60]
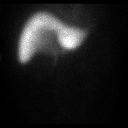
[frame 16/60]
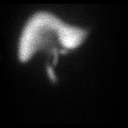
[frame 26/60]
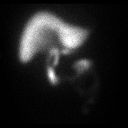
[frame 36/60]
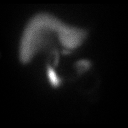
[frame 46/60]
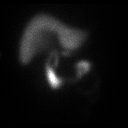
[frame 56/60]
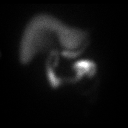

[Series 2: biliary · 3.25mm/px · 6 of 25 frames shown (2 of 2)]
[frame 3/25]
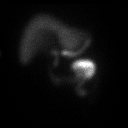
[frame 7/25]
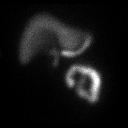
[frame 11/25]
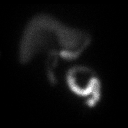
[frame 15/25]
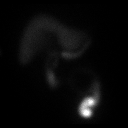
[frame 19/25]
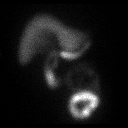
[frame 23/25]
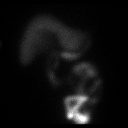

[Series 3: anterior · 3.25mm/px · 6 of 30 frames shown]
[frame 3/30]
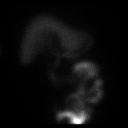
[frame 8/30]
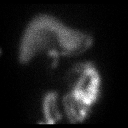
[frame 13/30]
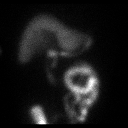
[frame 18/30]
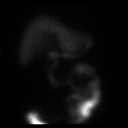
[frame 23/30]
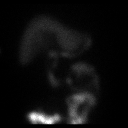
[frame 28/30]
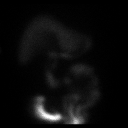

[18 of 18 positions shown; findings below may reference images not displayed]

FINDINGS: Prompt uptake and biliary excretion of activity by the liver is
seen. Biliary activity passes into small bowel, consistent with
patent common bile duct. The gallbladder was not visualized up to 90
minutes post injection despite the intravenous administration of 4
mg of morphine.
IMPRESSION: Nonvisualized gallbladder, compatible with cystic duct obstruction.

## 2020-09-24 NOTE — Patient Instructions (Signed)
LENNYN BELLANCA  09/24/2020     @PREFPERIOPPHARMACY @   Your procedure is scheduled on  09/27/2020.  Report to [facility] at  0830  A.M.  Call this number if you have problems the morning of surgery:  [phone]   Remember:  Follow the diet and prep instructions given to you by the office.                      Take these medicines the morning of surgery with A SIP OF WATER  Flexeril, gabapentin, metoprolol, prilosec. Use your inhalers before you come. Take 1/2 of your tresiba the night before your procedure(35 units). DO NOT take any medications for diabetes the morning of your procedure.    Do not wear jewelry, make-up or nail polish.  Do not wear lotions, powders, or perfumes. Please wear deodorant and brush your teeth.  Do not shave 48 hours prior to surgery.  Men may shave face and neck.  Do not bring valuables to the hospital.  [facility] is not responsible for any belongings or valuables.  Contacts, dentures or bridgework may not be worn into surgery.  Leave your suitcase in the car.  After surgery it may be brought to your room.  For patients admitted to the hospital, discharge time will be determined by your treatment team.  Patients discharged the day of surgery will not be allowed to drive home.   Name and phone number of your driver:   family Special instructions:  DO NOT smoke the morning of your procedure.  Please read over the following fact sheets that you were given. Anesthesia Post-op Instructions and Care and Recovery After Surgery       Upper Endoscopy, Adult, Care After This sheet gives you information about how to care for yourself after your procedure. Your health care provider may also give you more specific instructions. If you have problems or questions, contact your health care provider. What can I expect after the procedure? After the procedure, it is common to have:  A sore throat.  Mild stomach pain or  discomfort.  Bloating.  Nausea. Follow these instructions at home:   Follow instructions from your health care provider about what to eat or drink after your procedure.  Return to your normal activities as told by your health care provider. Ask your health care provider what activities are safe for you.  Take over-the-counter and prescription medicines only as told by your health care provider.  Do not drive for 24 hours if you were given a sedative during your procedure.  Keep all follow-up visits as told by your health care provider. This is important. Contact a health care provider if you have:  A sore throat that lasts longer than one day.  Trouble swallowing. Get help right away if:  You vomit blood or your vomit looks like coffee grounds.  You have: ? A fever. ? Bloody, black, or tarry stools. ? A severe sore throat or you cannot swallow. ? Difficulty breathing. ? Severe pain in your chest or abdomen. Summary  After the procedure, it is common to have a sore throat, mild stomach discomfort, bloating, and nausea.  Do not drive for 24 hours if you were given a sedative during the procedure.  Follow instructions from your health care provider about what to eat or drink after your procedure.  Return to your normal activities as told by your health care provider.  This information is not intended to replace advice given to you by your health care provider. Make sure you discuss any questions you have with your health care provider. Document Revised: 04/19/2018 Document Reviewed: 03/28/2018 Elsevier Patient Education  [place].  Colonoscopy, Adult, Care After This sheet gives you information about how to care for yourself after your procedure. Your health care provider may also give you more specific instructions. If you have problems or questions, contact your health care provider. What can I expect after the procedure? After the procedure, it is common to  have:  A small amount of blood in your stool for 24 hours after the procedure.  Some gas.  Mild cramping or bloating of your abdomen. Follow these instructions at home: Eating and drinking   Drink enough fluid to keep your urine pale yellow.  Follow instructions from your health care provider about eating or drinking restrictions.  Resume your normal diet as instructed by your health care provider. Avoid heavy or fried foods that are hard to digest. Activity  Rest as told by your health care provider.  Avoid sitting for a long time without moving. Get up to take short walks every 1-2 hours. This is important to improve blood flow and breathing. Ask for help if you feel weak or unsteady.  Return to your normal activities as told by your health care provider. Ask your health care provider what activities are safe for you. Managing cramping and bloating   Try walking around when you have cramps or feel bloated.  Apply heat to your abdomen as told by your health care provider. Use the heat source that your health care provider recommends, such as a moist heat pack or a heating pad. ? Place a towel between your skin and the heat source. ? Leave the heat on for 20-30 minutes. ? Remove the heat if your skin turns bright red. This is especially important if you are unable to feel pain, heat, or cold. You may have a greater risk of getting burned. General instructions  For the first 24 hours after the procedure: ? Do not drive or use machinery. ? Do not sign important documents. ? Do not drink alcohol. ? Do your regular daily activities at a slower pace than normal. ? Eat soft foods that are easy to digest.  Take over-the-counter and prescription medicines only as told by your health care provider.  Keep all follow-up visits as told by your health care provider. This is important. Contact a health care provider if:  You have blood in your stool 2-3 days after the procedure. Get  help right away if you have:  More than a small spotting of blood in your stool.  Large blood clots in your stool.  Swelling of your abdomen.  Nausea or vomiting.  A fever.  Increasing pain in your abdomen that is not relieved with medicine. Summary  After the procedure, it is common to have a small amount of blood in your stool. You may also have mild cramping and bloating of your abdomen.  For the first 24 hours after the procedure, do not drive or use machinery, sign important documents, or drink alcohol.  Get help right away if you have a lot of blood in your stool, nausea or vomiting, a fever, or increased pain in your abdomen. This information is not intended to replace advice given to you by your health care provider. Make sure you discuss any questions you have with your health  care provider. Document Revised: 05/22/2019 Document Reviewed: 05/22/2019 Elsevier Patient Education  [place] After These instructions provide you with information about caring for yourself after your procedure. Your health care provider may also give you more specific instructions. Your treatment has been planned according to current medical practices, but problems sometimes occur. Call your health care provider if you have any problems or questions after your procedure. What can I expect after the procedure? After your procedure, you may:  Feel sleepy for several hours.  Feel clumsy and have poor balance for several hours.  Feel forgetful about what happened after the procedure.  Have poor judgment for several hours.  Feel nauseous or vomit.  Have a sore throat if you had a breathing tube during the procedure. Follow these instructions at home: For at least 24 hours after the procedure:      Have a responsible adult stay with you. It is important to have someone help care for you until you are awake and alert.  Rest as needed.  Do  not: ? Participate in activities in which you could fall or become injured. ? Drive. ? Use heavy machinery. ? Drink alcohol. ? Take sleeping pills or medicines that cause drowsiness. ? Make important decisions or sign legal documents. ? Take care of children on your own. Eating and drinking  Follow the diet that is recommended by your health care provider.  If you vomit, drink water, juice, or soup when you can drink without vomiting.  Make sure you have little or no nausea before eating solid foods. General instructions  Take over-the-counter and prescription medicines only as told by your health care provider.  If you have sleep apnea, surgery and certain medicines can increase your risk for breathing problems. Follow instructions from your health care provider about wearing your sleep device: ? Anytime you are sleeping, including during daytime naps. ? While taking prescription pain medicines, sleeping medicines, or medicines that make you drowsy.  If you smoke, do not smoke without supervision.  Keep all follow-up visits as told by your health care provider. This is important. Contact a health care provider if:  You keep feeling nauseous or you keep vomiting.  You feel light-headed.  You develop a rash.  You have a fever. Get help right away if:  You have trouble breathing. Summary  For several hours after your procedure, you may feel sleepy and have poor judgment.  Have a responsible adult stay with you for at least 24 hours or until you are awake and alert. This information is not intended to replace advice given to you by your health care provider. Make sure you discuss any questions you have with your health care provider. Document Revised: 01/24/2018 Document Reviewed: 02/16/2016 Elsevier Patient Education  [place].

## 2020-09-25 ENCOUNTER — Other Ambulatory Visit: Payer: Self-pay

## 2020-09-25 ENCOUNTER — Other Ambulatory Visit (HOSPITAL_COMMUNITY)
Admission: RE | Admit: 2020-09-25 | Discharge: 2020-09-25 | Disposition: A | Discharge status: Home / Self Care | Payer: Medicare Other | Source: Ambulatory Visit | Attending: Gastroenterology | Admitting: Gastroenterology

## 2020-09-25 ENCOUNTER — Encounter (HOSPITAL_COMMUNITY)
Admission: RE | Admit: 2020-09-25 | Discharge: 2020-09-25 | Disposition: A | Discharge status: Home / Self Care | Payer: Medicare Other | Source: Ambulatory Visit | Attending: Gastroenterology | Admitting: Gastroenterology

## 2020-09-25 ENCOUNTER — Encounter (HOSPITAL_COMMUNITY): Payer: Self-pay

## 2020-09-25 DIAGNOSIS — Z01812 Encounter for preprocedural laboratory examination: Secondary | ICD-10-CM | POA: Diagnosis not present

## 2020-09-25 DIAGNOSIS — Z20822 Contact with and (suspected) exposure to covid-19: Secondary | ICD-10-CM | POA: Diagnosis not present

## 2020-09-25 LAB — SARS CORONAVIRUS 2 (TAT 6-24 HRS): SARS Coronavirus 2: NEGATIVE

## 2020-09-27 ENCOUNTER — Other Ambulatory Visit: Payer: Self-pay

## 2020-09-27 ENCOUNTER — Ambulatory Visit (HOSPITAL_COMMUNITY)
Admission: RE | Admit: 2020-09-27 | Discharge: 2020-09-27 | Disposition: A | Discharge status: Home / Self Care | Payer: Medicare Other | Attending: Gastroenterology | Admitting: Gastroenterology

## 2020-09-27 ENCOUNTER — Ambulatory Visit (HOSPITAL_COMMUNITY): Payer: Medicare Other | Admitting: Anesthesiology

## 2020-09-27 ENCOUNTER — Encounter (HOSPITAL_COMMUNITY)
Admission: RE | Disposition: A | Discharge status: Home / Self Care | Payer: Self-pay | Source: Home / Self Care | Attending: Gastroenterology

## 2020-09-27 ENCOUNTER — Encounter (HOSPITAL_COMMUNITY): Payer: Self-pay | Admitting: Gastroenterology

## 2020-09-27 DIAGNOSIS — Z794 Long term (current) use of insulin: Secondary | ICD-10-CM | POA: Diagnosis not present

## 2020-09-27 DIAGNOSIS — Z13810 Encounter for screening for upper gastrointestinal disorder: Secondary | ICD-10-CM | POA: Diagnosis not present

## 2020-09-27 DIAGNOSIS — K766 Portal hypertension: Secondary | ICD-10-CM | POA: Diagnosis not present

## 2020-09-27 DIAGNOSIS — Z1211 Encounter for screening for malignant neoplasm of colon: Secondary | ICD-10-CM | POA: Insufficient documentation

## 2020-09-27 DIAGNOSIS — Z7982 Long term (current) use of aspirin: Secondary | ICD-10-CM | POA: Insufficient documentation

## 2020-09-27 DIAGNOSIS — Z8616 Personal history of COVID-19: Secondary | ICD-10-CM | POA: Insufficient documentation

## 2020-09-27 DIAGNOSIS — K7460 Unspecified cirrhosis of liver: Secondary | ICD-10-CM | POA: Diagnosis not present

## 2020-09-27 DIAGNOSIS — Z7951 Long term (current) use of inhaled steroids: Secondary | ICD-10-CM | POA: Diagnosis not present

## 2020-09-27 DIAGNOSIS — I8510 Secondary esophageal varices without bleeding: Secondary | ICD-10-CM | POA: Diagnosis not present

## 2020-09-27 DIAGNOSIS — Z79899 Other long term (current) drug therapy: Secondary | ICD-10-CM | POA: Insufficient documentation

## 2020-09-27 DIAGNOSIS — Z885 Allergy status to narcotic agent status: Secondary | ICD-10-CM | POA: Insufficient documentation

## 2020-09-27 DIAGNOSIS — Z888 Allergy status to other drugs, medicaments and biological substances status: Secondary | ICD-10-CM | POA: Diagnosis not present

## 2020-09-27 DIAGNOSIS — Z8673 Personal history of transient ischemic attack (TIA), and cerebral infarction without residual deficits: Secondary | ICD-10-CM | POA: Insufficient documentation

## 2020-09-27 DIAGNOSIS — Z955 Presence of coronary angioplasty implant and graft: Secondary | ICD-10-CM | POA: Diagnosis not present

## 2020-09-27 DIAGNOSIS — K449 Diaphragmatic hernia without obstruction or gangrene: Secondary | ICD-10-CM | POA: Diagnosis not present

## 2020-09-27 DIAGNOSIS — K3189 Other diseases of stomach and duodenum: Secondary | ICD-10-CM | POA: Diagnosis not present

## 2020-09-27 DIAGNOSIS — Z951 Presence of aortocoronary bypass graft: Secondary | ICD-10-CM | POA: Diagnosis not present

## 2020-09-27 DIAGNOSIS — K649 Unspecified hemorrhoids: Secondary | ICD-10-CM | POA: Insufficient documentation

## 2020-09-27 HISTORY — PX: ESOPHAGOGASTRODUODENOSCOPY (EGD) WITH PROPOFOL: SHX5813

## 2020-09-27 HISTORY — PX: COLONOSCOPY WITH PROPOFOL: SHX5780

## 2020-09-27 LAB — GLUCOSE, CAPILLARY
Glucose-Capillary: 80 mg/dL (ref 70–99)
Glucose-Capillary: 85 mg/dL (ref 70–99)

## 2020-09-27 LAB — HM COLONOSCOPY

## 2020-09-27 SURGERY — ESOPHAGOGASTRODUODENOSCOPY (EGD) WITH PROPOFOL
Anesthesia: General

## 2020-09-27 MED ORDER — PROPOFOL 10 MG/ML IV BOLUS
INTRAVENOUS | Status: DC | PRN
  Administered 2020-09-27 (×2): 50 mg via INTRAVENOUS
  Administered 2020-09-27: 100 mg via INTRAVENOUS

## 2020-09-27 MED ORDER — PROPOFOL 500 MG/50ML IV EMUL
INTRAVENOUS | Status: DC | PRN
  Administered 2020-09-27: 100 ug/kg/min via INTRAVENOUS

## 2020-09-27 MED ORDER — LIDOCAINE VISCOUS HCL 2 % MT SOLN
15.0000 mL | Freq: Once | OROMUCOSAL | Status: AC
  Administered 2020-09-27: 15 mL via OROMUCOSAL

## 2020-09-27 MED ORDER — LACTATED RINGERS IV SOLN: Freq: Once | INTRAVENOUS | Status: AC

## 2020-09-27 MED ORDER — CARVEDILOL 3.125 MG PO TABS: 3.1250 mg | ORAL_TABLET | Freq: Two times a day (BID) | ORAL | 11 refills | Status: DC

## 2020-09-27 MED ORDER — PHENYLEPHRINE 40 MCG/ML (10ML) SYRINGE FOR IV PUSH (FOR BLOOD PRESSURE SUPPORT)
PREFILLED_SYRINGE | INTRAVENOUS | Status: DC | PRN
  Administered 2020-09-27: 80 ug via INTRAVENOUS
  Administered 2020-09-27: 40 ug via INTRAVENOUS

## 2020-09-27 MED ORDER — LIDOCAINE HCL (CARDIAC) PF 100 MG/5ML IV SOSY
PREFILLED_SYRINGE | INTRAVENOUS | Status: DC | PRN
  Administered 2020-09-27: 50 mg via INTRAVENOUS

## 2020-09-27 MED ORDER — LIDOCAINE VISCOUS HCL 2 % MT SOLN
OROMUCOSAL | Status: AC
  Filled 2020-09-27: qty 15

## 2020-09-27 MED ORDER — PHENYLEPHRINE 40 MCG/ML (10ML) SYRINGE FOR IV PUSH (FOR BLOOD PRESSURE SUPPORT)
PREFILLED_SYRINGE | INTRAVENOUS | Status: AC
  Filled 2020-09-27: qty 10

## 2020-09-27 MED ORDER — LACTATED RINGERS IV SOLN: INTRAVENOUS | Status: DC | PRN

## 2020-09-27 NOTE — Op Note (Addendum)
[facility] Patient Name: [name] [name] Procedure Date: 09/27/2020 9:48 AM MRN: [id number] Date of Birth: [birth year] Attending MD: [name] ,  CSN: [id number] Age: 64 Admit Type: Outpatient Procedure:                Upper GI endoscopy Indications:              Cirrhosis with suspected esophageal varices Providers:                [name], Janeece Riggers, RN, Kristine L.                            Risa Grill, Technician Referring MD:              Medicines:                Monitored Anesthesia Care Complications:            No immediate complications. Estimated Blood Loss:     Estimated blood loss: none. Procedure:                Pre-Anesthesia Assessment:                           - Prior to the procedure, a History and Physical                            was performed, and patient medications, allergies                            and sensitivities were reviewed. The patient's                            tolerance of previous anesthesia was reviewed.                           - The risks and benefits of the procedure and the                            sedation options and risks were discussed with the                            patient. All questions were answered and informed                            consent was obtained.                           - ASA Grade Assessment: III - A patient with severe                            systemic disease.                           After obtaining informed consent, the endoscope was                            passed under direct vision. Throughout the  procedure, the patient's blood pressure, pulse, and                            oxygen saturations were monitored continuously. The                            [id number]-H190 ([id number]) scope was introduced through the                            mouth, and advanced to the second part of duodenum.                            The upper GI endoscopy was accomplished  without                            difficulty. The patient tolerated the procedure                            well. Scope In: 10:03:59 AM Scope Out: 10:11:26 AM Total Procedure Duration: 0 hours 7 minutes 27 seconds  Findings:      One column of grade I varices were found in the lower third of the       esophagus.      A small hiatal hernia was present.      Mild portal hypertensive gastropathy was found in the gastric body.      The examined duodenum was normal. Impression:               - Grade I esophageal varices.                           - Small hiatal hernia.                           - Portal hypertensive gastropathy.                           - Normal examined duodenum.                           - No specimens collected. Moderate Sedation:      Per Anesthesia Care Recommendation:           - Discharge patient to home (ambulatory).                           - Resume previous diet.                           - Start carvedilol 3.125 mg BID, stop metoprolol. Procedure Code(s):        --- Professional ---                           [phone], GC, Esophagogastroduodenoscopy, flexible,                            transoral; diagnostic, including collection of  specimen(s) by brushing or washing, when performed                            (separate procedure) Diagnosis Code(s):        --- Professional ---                           K74.60, Unspecified cirrhosis of liver                           I85.10, Secondary esophageal varices without                            bleeding                           K44.9, Diaphragmatic hernia without obstruction or                            gangrene                           K76.6, Portal hypertension                           K31.89, Other diseases of stomach and duodenum CPT copyright 2019 American Medical Association. All rights reserved. The codes documented in this report are preliminary and upon coder review may  be  revised to meet current compliance requirements. [name], MD [name],  09/27/2020 10:38:38 AM This report has been signed electronically. Number of Addenda: 0

## 2020-09-27 NOTE — Anesthesia Procedure Notes (Signed)
Date/Time: 09/27/2020 10:00 AM Performed by: Orlie Dakin, CRNA Pre-anesthesia Checklist: Patient identified, Emergency Drugs available, Suction available and Patient being monitored Patient Re-evaluated:Patient Re-evaluated prior to induction Oxygen Delivery Method: Nasal cannula Induction Type: IV induction Placement Confirmation: positive ETCO2

## 2020-09-27 NOTE — Anesthesia Postprocedure Evaluation (Signed)
Anesthesia Post Note  Patient: [name] [name]  Procedure(s) Performed: ESOPHAGOGASTRODUODENOSCOPY (EGD) WITH PROPOFOL (N/A ) COLONOSCOPY WITH PROPOFOL (N/A )  Patient location during evaluation: PACU Anesthesia Type: General Level of consciousness: awake and alert and oriented Pain management: pain level controlled Vital Signs Assessment: post-procedure vital signs reviewed and stable Respiratory status: spontaneous breathing, nonlabored ventilation and respiratory function stable Cardiovascular status: blood pressure returned to baseline and stable Postop Assessment: no apparent nausea or vomiting Anesthetic complications: no   No complications documented.   Last Vitals:  Vitals:   09/27/20 1045 09/27/20 1102  BP: 101/72 130/68  Pulse: 76 69  Resp: 15 16  Temp:  36.5 C  SpO2: 98% 99%    Last Pain:  Vitals:   09/27/20 1102  TempSrc: Oral  PainSc: 0-No pain                 Orlie Dakin

## 2020-09-27 NOTE — Discharge Instructions (Signed)
You are being discharged to home.  Resume your previous diet.  Start carvedilol 3.125 mg BID, stop metoprolol. Your physician has recommended a repeat colonoscopy in 10 years for screening purposes.   PATIENT INSTRUCTIONS POST-ANESTHESIA  IMMEDIATELY FOLLOWING SURGERY:  Do not drive or operate machinery for the first twenty four hours after surgery.  Do not make any important decisions for twenty four hours after surgery or while taking narcotic pain medications or sedatives.  If you develop intractable nausea and vomiting or a severe headache please notify your doctor immediately.  FOLLOW-UP:  Please make an appointment with your surgeon as instructed. You do not need to follow up with anesthesia unless specifically instructed to do so.  WOUND CARE INSTRUCTIONS (if applicable):  Keep a dry clean dressing on the anesthesia/puncture wound site if there is drainage.  Once the wound has quit draining you may leave it open to air.  Generally you should leave the bandage intact for twenty four hours unless there is drainage.  If the epidural site drains for more than 36-48 hours please call the anesthesia department.  QUESTIONS?:  Please feel free to call your physician or the hospital operator if you have any questions, and they will be happy to assist you.      Upper Endoscopy, Adult, Care After This sheet gives you information about how to care for yourself after your procedure. Your health care provider may also give you more specific instructions. If you have problems or questions, contact your health care provider. What can I expect after the procedure? After the procedure, it is common to have:  A sore throat.  Mild stomach pain or discomfort.  Bloating.  Nausea. Follow these instructions at home:   Follow instructions from your health care provider about what to eat or drink after your procedure.  Return to your normal activities as told by your health care provider. Ask your  health care provider what activities are safe for you.  Take over-the-counter and prescription medicines only as told by your health care provider.  Do not drive for 24 hours if you were given a sedative during your procedure.  Keep all follow-up visits as told by your health care provider. This is important. Contact a health care provider if you have:  A sore throat that lasts longer than one day.  Trouble swallowing. Get help right away if:  You vomit blood or your vomit looks like coffee grounds.  You have: ? A fever. ? Bloody, black, or tarry stools. ? A severe sore throat or you cannot swallow. ? Difficulty breathing. ? Severe pain in your chest or abdomen. Summary  After the procedure, it is common to have a sore throat, mild stomach discomfort, bloating, and nausea.  Do not drive for 24 hours if you were given a sedative during the procedure.  Follow instructions from your health care provider about what to eat or drink after your procedure.  Return to your normal activities as told by your health care provider. This information is not intended to replace advice given to you by your health care provider. Make sure you discuss any questions you have with your health care provider. Document Revised: 04/19/2018 Document Reviewed: 03/28/2018 Elsevier Patient Education  [place].    Colonoscopy, Adult, Care After This sheet gives you information about how to care for yourself after your procedure. Your doctor may also give you more specific instructions. If you have problems or questions, call your doctor. What can  I expect after the procedure? After the procedure, it is common to have:  A small amount of blood in your poop (stool) for 24 hours.  Some gas.  Mild cramping or bloating in your belly (abdomen). Follow these instructions at home: Eating and drinking   Drink enough fluid to keep your pee (urine) pale yellow.  Follow instructions from your  doctor about what you cannot eat or drink.  Return to your normal diet as told by your doctor. Avoid heavy or fried foods that are hard to digest. Activity  Rest as told by your doctor.  Do not sit for a long time without moving. Get up to take short walks every 1-2 hours. This is important. Ask for help if you feel weak or unsteady.  Return to your normal activities as told by your doctor. Ask your doctor what activities are safe for you. To help cramping and bloating:   Try walking around.  Put heat on your belly as told by your doctor. Use the heat source that your doctor recommends, such as a moist heat pack or a heating pad. ? Put a towel between your skin and the heat source. ? Leave the heat on for 20-30 minutes. ? Remove the heat if your skin turns bright red. This is very important if you are unable to feel pain, heat, or cold. You may have a greater risk of getting burned. General instructions  For the first 24 hours after the procedure: ? Do not drive or use machinery. ? Do not sign important documents. ? Do not drink alcohol. ? Do your daily activities more slowly than normal. ? Eat foods that are soft and easy to digest.  Take over-the-counter or prescription medicines only as told by your doctor.  Keep all follow-up visits as told by your doctor. This is important. Contact a doctor if:  You have blood in your poop 2-3 days after the procedure. Get help right away if:  You have more than a small amount of blood in your poop.  You see large clumps of tissue (blood clots) in your poop.  Your belly is swollen.  You feel like you may vomit (nauseous).  You vomit.  You have a fever.  You have belly pain that gets worse, and medicine does not help your pain. Summary  After the procedure, it is common to have a small amount of blood in your poop. You may also have mild cramping and bloating in your belly.  For the first 24 hours after the procedure, do not  drive or use machinery, do not sign important documents, and do not drink alcohol.  Get help right away if you have a lot of blood in your poop, feel like you may vomit, have a fever, or have more belly pain. This information is not intended to replace advice given to you by your health care provider. Make sure you discuss any questions you have with your health care provider. Document Revised: 05/22/2019 Document Reviewed: 05/22/2019 Elsevier Patient Education  [place].    Hiatal Hernia  A hiatal hernia occurs when part of the stomach slides above the muscle that separates the abdomen from the chest (diaphragm). A person can be born with a hiatal hernia (congenital), or it may develop over time. In almost all cases of hiatal hernia, only the top part of the stomach pushes through the diaphragm. Many people have a hiatal hernia with no symptoms. The larger the hernia, the more likely  it is that you will have symptoms. In some cases, a hiatal hernia allows stomach acid to flow back into the tube that carries food from your mouth to your stomach (esophagus). This may cause heartburn symptoms. Severe heartburn symptoms may mean that you have developed a condition called gastroesophageal reflux disease (GERD). What are the causes? This condition is caused by a weakness in the opening (hiatus) where the esophagus passes through the diaphragm to attach to the upper part of the stomach. A person may be born with a weakness in the hiatus, or a weakness can develop over time. What increases the risk? This condition is more likely to develop in:  Older people. Age is a major risk factor for a hiatal hernia, especially if you are over the age of 19.  Pregnant women.  People who are overweight.  People who have frequent constipation. What are the signs or symptoms? Symptoms of this condition usually develop in the form of GERD symptoms. Symptoms  include:  Heartburn.  Belching.  Indigestion.  Trouble swallowing.  Coughing or wheezing.  Sore throat.  Hoarseness.  Chest pain.  Nausea and vomiting. How is this diagnosed? This condition may be diagnosed during testing for GERD. Tests that may be done include:  X-rays of your stomach or chest.  An upper gastrointestinal (GI) series. This is an X-ray exam of your GI tract that is taken after you swallow a chalky liquid that shows up clearly on the X-ray.  Endoscopy. This is a procedure to look into your stomach using a thin, flexible tube that has a tiny camera and light on the end of it. How is this treated? This condition may be treated by:  Dietary and lifestyle changes to help reduce GERD symptoms.  Medicines. These may include: ? Over-the-counter antacids. ? Medicines that make your stomach empty more quickly. ? Medicines that block the production of stomach acid (H2 blockers). ? Stronger medicines to reduce stomach acid (proton pump inhibitors).  Surgery to repair the hernia, if other treatments are not helping. If you have no symptoms, you may not need treatment. Follow these instructions at home: Lifestyle and activity  Do not use any products that contain nicotine or tobacco, such as cigarettes and e-cigarettes. If you need help quitting, ask your health care provider.  Try to achieve and maintain a healthy body weight.  Avoid putting pressure on your abdomen. Anything that puts pressure on your abdomen increases the amount of acid that may be pushed up into your esophagus. ? Avoid bending over, especially after eating. ? Raise the head of your bed by putting blocks under the legs. This keeps your head and esophagus higher than your stomach. ? Do not wear tight clothing around your chest or stomach. ? Try not to strain when having a bowel movement, when urinating, or when lifting heavy objects. Eating and drinking  Avoid foods that can worsen GERD  symptoms. These may include: ? Fatty foods, like fried foods. ? Citrus fruits, like oranges or lemon. ? Other foods and drinks that contain acid, like orange juice or tomatoes. ? Spicy food. ? Chocolate.  Eat frequent small meals instead of three large meals a day. This helps prevent your stomach from getting too full. ? Eat slowly. ? Do not lie down right after eating. ? Do not eat 1-2 hours before bed.  Do not drink beverages with caffeine. These include cola, coffee, cocoa, and tea.  Do not drink alcohol. General instructions  Take over-the-counter  and prescription medicines only as told by your health care provider.  Keep all follow-up visits as told by your health care provider. This is important. Contact a health care provider if:  Your symptoms are not controlled with medicines or lifestyle changes.  You are having trouble swallowing.  You have coughing or wheezing that will not go away. Get help right away if:  Your pain is getting worse.  Your pain spreads to your arms, neck, jaw, teeth, or back.  You have shortness of breath.  You sweat for no reason.  You feel sick to your stomach (nauseous) or you vomit.  You vomit blood.  You have bright red blood in your stools.  You have black, tarry stools. This information is not intended to replace advice given to you by your health care provider. Make sure you discuss any questions you have with your health care provider. Document Revised: 10/08/2017 Document Reviewed: 05/31/2017 Elsevier Patient Education  2020 [employer].   Hemorrhoids Hemorrhoids are swollen veins that may develop:  In the butt (rectum). These are called internal hemorrhoids.  Around the opening of the butt (anus). These are called external hemorrhoids. Hemorrhoids can cause pain, itching, or bleeding. Most of the time, they do not cause serious problems. They usually get better with diet changes, lifestyle changes, and other home  treatments. What are the causes? This condition may be caused by:  Having trouble pooping (constipation).  Pushing hard (straining) to poop.  Watery poop (diarrhea).  Pregnancy.  Being very overweight (obese).  Sitting for long periods of time.  Heavy lifting or other activity that causes you to strain.  Anal sex.  Riding a bike for a long period of time. What are the signs or symptoms? Symptoms of this condition include:  Pain.  Itching or soreness in the butt.  Bleeding from the butt.  Leaking poop.  Swelling in the area.  One or more lumps around the opening of your butt. How is this diagnosed? A doctor can often diagnose this condition by looking at the affected area. The doctor may also:  Do an exam that involves feeling the area with a gloved hand (digital rectal exam).  Examine the area inside your butt using a small tube (anoscope).  Order blood tests. This may be done if you have lost a lot of blood.  Have you get a test that involves looking inside the colon using a flexible tube with a camera on the end (sigmoidoscopy or colonoscopy). How is this treated? This condition can usually be treated at home. Your doctor may tell you to change what you eat, make lifestyle changes, or try home treatments. If these do not help, procedures can be done to remove the hemorrhoids or make them smaller. These may involve:  Placing rubber bands at the base of the hemorrhoids to cut off their blood supply.  Injecting medicine into the hemorrhoids to shrink them.  Shining a type of light energy onto the hemorrhoids to cause them to fall off.  Doing surgery to remove the hemorrhoids or cut off their blood supply. Follow these instructions at home: Eating and drinking   Eat foods that have a lot of fiber in them. These include whole grains, beans, nuts, fruits, and vegetables.  Ask your doctor about taking products that have added fiber (fibersupplements).  Reduce  the amount of fat in your diet. You can do this by: ? Eating low-fat dairy products. ? Eating less red meat. ? Avoiding processed  foods.  Drink enough fluid to keep your pee (urine) pale yellow. Managing pain and swelling   Take a warm-water bath (sitz bath) for 20 minutes to ease pain. Do this 3-4 times a day. You may do this in a bathtub or using a portable sitz bath that fits over the toilet.  If told, put ice on the painful area. It may be helpful to use ice between your warm baths. ? Put ice in a plastic bag. ? Place a towel between your skin and the bag. ? Leave the ice on for 20 minutes, 2-3 times a day. General instructions  Take over-the-counter and prescription medicines only as told by your doctor. ? Medicated creams and medicines may be used as told.  Exercise often. Ask your doctor how much and what kind of exercise is best for you.  Go to the bathroom when you have the urge to poop. Do not wait.  Avoid pushing too hard when you poop.  Keep your butt dry and clean. Use wet toilet paper or moist towelettes after pooping.  Do not sit on the toilet for a long time.  Keep all follow-up visits as told by your doctor. This is important. Contact a doctor if you:  Have pain and swelling that do not get better with treatment or medicine.  Have trouble pooping.  Cannot poop.  Have pain or swelling outside the area of the hemorrhoids. Get help right away if you have:  Bleeding that will not stop. Summary  Hemorrhoids are swollen veins in the butt or around the opening of the butt.  They can cause pain, itching, or bleeding.  Eat foods that have a lot of fiber in them. These include whole grains, beans, nuts, fruits, and vegetables.  Take a warm-water bath (sitz bath) for 20 minutes to ease pain. Do this 3-4 times a day. This information is not intended to replace advice given to you by your health care provider. Make sure you discuss any questions you have with  your health care provider. Document Revised: 11/03/2018 Document Reviewed: 03/17/2018 Elsevier Patient Education  [place].

## 2020-09-27 NOTE — Op Note (Signed)
[facility] Patient Name: [name] [name] Procedure Date: 09/27/2020 10:13 AM MRN: [id number] Date of Birth: 07-Oct-1956 Attending MD: [name] ,  CSN: [id number] Age: 64 Admit Type: Outpatient Procedure:                Colonoscopy Indications:              Screening for colorectal malignant neoplasm Providers:                [name], Janeece Riggers, RN, Kristine L.                            Risa Grill, Technician, Nelma Rothman, [employer] Referring MD:              Medicines:                Monitored Anesthesia Care Complications:            No immediate complications. Estimated Blood Loss:     Estimated blood loss: none. Procedure:                Pre-Anesthesia Assessment:                           - Prior to the procedure, a History and Physical                            was performed, and patient medications, allergies                            and sensitivities were reviewed. The patient's                            tolerance of previous anesthesia was reviewed.                           - The risks and benefits of the procedure and the                            sedation options and risks were discussed with the                            patient. All questions were answered and informed                            consent was obtained.                           - ASA Grade Assessment: III - A patient with severe                            systemic disease.                           After obtaining informed consent, the colonoscope                            was passed under direct vision. Throughout  the                            procedure, the patient's blood pressure, pulse, and                            oxygen saturations were monitored continuously. The                            [id number]-[id number] ([id number]) scope was introduced through                            the anus and advanced to the the terminal ileum.                            The colonoscopy was  performed without difficulty.                            The patient tolerated the procedure well. The                            quality of the bowel preparation was excellent.                            Scope withdrawal time was 11 minutes. Scope In: 10:16:57 AM Scope Out: 10:33:37 AM Scope Withdrawal Time: 0 hours 11 minutes 45 seconds  Total Procedure Duration: 0 hours 16 minutes 40 seconds  Findings:      Hemorrhoids were found on perianal exam.      The entire examined colon appeared normal on direct and retroflexion       views. Impression:               - Hemorrhoids found on perianal exam.                           - The entire examined colon is normal on direct and                            retroflexion views.                           - No specimens collected. Moderate Sedation:      Per Anesthesia Care Recommendation:           - Discharge patient to home (ambulatory).                           - Resume previous diet.                           - Repeat colonoscopy in 10 years for screening                            purposes. Procedure Code(s):        --- Professional ---  G0121, GC, Colorectal cancer screening; colonoscopy                            on individual not meeting criteria for high risk Diagnosis Code(s):        --- Professional ---                           Z12.11, Encounter for screening for malignant                            neoplasm of colon                           K64.9, Unspecified hemorrhoids CPT copyright 2019 American Medical Association. All rights reserved. The codes documented in this report are preliminary and upon coder review may  be revised to meet current compliance requirements. [name], MD [name],  09/27/2020 10:42:38 AM This report has been signed electronically. Number of Addenda: 0

## 2020-09-27 NOTE — Transfer of Care (Signed)
Immediate Anesthesia Transfer of Care Note  Patient: [name] [name]  Procedure(s) Performed: ESOPHAGOGASTRODUODENOSCOPY (EGD) WITH PROPOFOL (N/A ) COLONOSCOPY WITH PROPOFOL (N/A )  Patient Location: PACU  Anesthesia Type:General  Level of Consciousness: drowsy  Airway & Oxygen Therapy: Patient Spontanous Breathing and Patient connected to face mask oxygen  Post-op Assessment: Report given to RN and Post -op Vital signs reviewed and stable  Post vital signs: Reviewed and stable  Last Vitals:  Vitals Value Taken Time  BP    Temp    Pulse    Resp 11 09/27/20 1037  SpO2    Vitals shown include unvalidated device data.  Last Pain:  Vitals:   09/27/20 0908  TempSrc: Oral  PainSc:       Patients Stated Pain Goal: 10 (12/54/83 2346)  Complications: No complications documented.

## 2020-09-27 NOTE — H&P (Signed)
[name] [name] is an 64 y.o. male.   Chief Complaint: Esophageal varices and colorectal cancer screening HPI: [name] [name] is a 64 y.o. male w with past medical history of recent COVID-19 infection, myocardial infarction status post CABG, anxiety, asthma, [name] cirrhosis, hyperlipidemia, OSA, and type 2 diabetes, who comes to the hospital for screening of esophageal varices and colorectal cancer screening.  The patient has never had an EGD, his last colonoscopy was performed 10 years ago which was normal.  She denies having any complaints such as melena, hematochezia, abdominal pain or distention, change in her bowel movement consistency or frequency, no changes in her weight recently.  No family history of colorectal cancer.  Most recent platelet count was less than 150k.   Past Medical History:  Diagnosis Date  . Anxiety   . Arthritis   . Asthma   . Cataract    Right eye  . Chronic lower back pain   . Cirrhosis of liver ([place])   . Coronary atherosclerosis of native coronary artery    a. LAD & OM stenting;  b. 2007 Cath: nonobs dzs, patent stents;  c. 07/2012 neg MV, EF 61%.  d. 11/20/14: [place] s/p  PCI w/ DES to pLAD and DES to OM1   . Essential hypertension   . GERD (gastroesophageal reflux disease)   . Hearing loss of left ear   . History of gout   . History of hiatal hernia   . Hypercholesteremia   . Lumbar herniated disc   . MI, old 2017  . Migraine   . OSA (obstructive sleep apnea)   . 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 Thrombocytopenia ([place])   . TIA (transient ischemic attack) ~ 2010  . Type 2 diabetes mellitus ([place])     Past Surgical History:  Procedure Laterality Date  . ANTERIOR CERVICAL DECOMP/DISCECTOMY FUSION  1998   "C3-4"  . CARDIAC CATHETERIZATION  "several"  . CARDIAC CATHETERIZATION N/A 12/30/2015   Procedure: Left Heart Cath and Coronary Angiography;  Surgeon: [name], MD;   Location: [place] [facility];  Service: Cardiovascular;  Laterality: N/A;  . CARPAL TUNNEL RELEASE Bilateral 2005  . CHOLECYSTECTOMY N/A 11/28/2018   Procedure: LAPAROSCOPIC CHOLECYSTECTOMY;  Surgeon: [name], MD;  Location: [facility];  Service: General;  Laterality: N/A;  . COLONOSCOPY    . CORONARY ANGIOPLASTY    . CORONARY ANGIOPLASTY WITH STENT PLACEMENT  2002; 2003; 11/20/2014   "I have 4 stents after today" (11/20/2014)  . CORONARY ARTERY BYPASS GRAFT N/A 01/09/2016   Procedure: CORONARY ARTERY BYPASS GRAFTING (CABG) X 3 UTILIZING RIGHT AND LEFT INTERNAL MAMMARY ARTERY AND ENDOSCOPICALLY HARVESTED SAPHENEOUS VEIN.;  Surgeon: Rexene Alberts, MD;  Location: [facility];  Service: Open Heart Surgery;  Laterality: N/A;  . ESOPHAGOGASTRODUODENOSCOPY    . KNEE SURGERY Left 02/2012   "scraped; open"  . LEFT HEART CATH AND CORS/GRAFTS ANGIOGRAPHY N/A 08/09/2017   Procedure: LEFT HEART CATH AND CORS/GRAFTS ANGIOGRAPHY;  Surgeon: [name], MD;  Location: [place] [facility];  Service: Cardiovascular;  Laterality: N/A;  . LEFT HEART CATH AND CORS/GRAFTS ANGIOGRAPHY N/A 11/22/2018   Procedure: LEFT HEART CATH AND CORS/GRAFTS ANGIOGRAPHY;  Surgeon: [name], [name], MD;  Location: [place] [facility];  Service: Cardiovascular;  Laterality: N/A;  . LEFT HEART CATHETERIZATION WITH CORONARY ANGIOGRAM N/A 07/20/2012   Procedure: LEFT HEART CATHETERIZATION WITH  CORONARY ANGIOGRAM;  Surgeon: [name], MD;  Location: [facility] CATH LAB;  Service: Cardiovascular;  Laterality: N/A;  . LEFT HEART CATHETERIZATION WITH CORONARY ANGIOGRAM N/A 11/20/2014   Procedure: LEFT HEART CATHETERIZATION WITH CORONARY ANGIOGRAM;  Surgeon: [name], MD;  Location: [facility] CATH LAB;  Service: Cardiovascular;  Laterality: N/A;  . LEFT HEART CATHETERIZATION WITH CORONARY ANGIOGRAM N/A 11/26/2014   Procedure: LEFT HEART CATHETERIZATION WITH CORONARY ANGIOGRAM;  Surgeon: [name], MD;  Location: [facility] CATH LAB;  Service:  Cardiovascular;  Laterality: N/A;  . NEUROPLASTY / TRANSPOSITION ULNAR NERVE AT ELBOW Right ~ 2012  . PERCUTANEOUS CORONARY ROTOBLATOR INTERVENTION (PCI-R)  11/20/2014   Procedure: PERCUTANEOUS CORONARY ROTOBLATOR INTERVENTION (PCI-R);  Surgeon: [name], MD;  Location: [facility] CATH LAB;  Service: Cardiovascular;;  . SHOULDER ARTHROSCOPY Left ~ 2011  . TEE WITHOUT CARDIOVERSION N/A 01/09/2016   Procedure: TRANSESOPHAGEAL ECHOCARDIOGRAM (TEE);  Surgeon: Rexene Alberts, MD;  Location: [place];  Service: Open Heart Surgery;  Laterality: N/A;    Family History  Problem Relation Age of Onset  . Stroke Mother   . Coronary artery disease Father 64  . Asthma Sister   . Multiple sclerosis Brother   . Heart disease Paternal Uncle   . Stroke Maternal Grandmother   . Heart attack Paternal Grandmother   . Cancer Paternal Grandfather   . Asthma Sister   . Seizures Son   . Migraines Son   . Autism Son   . Migraines Son    Social History:  reports that he has never smoked. He has never used smokeless tobacco. He reports that he does not drink alcohol and does not use drugs.  Allergies:  Allergies  Allergen Reactions  . Divalproex Sodium Other (See Comments)    Causes anger  . Statins Other (See Comments)    Muscle aches and cramps  . Tramadol Other (See Comments)    Chest pain   . Valproic Acid Other (See Comments)    Causes anger  . Gadolinium Derivatives Nausea And Vomiting    07/25/19 Pt vomited immediately after IV gad. Denies itching, dyspnea.  (Adverse, not allergic, reaction  . Tricor [Fenofibrate] Other (See Comments)    Leg cramps    Medications Prior to Admission  Medication Sig Dispense Refill  . aspirin EC 81 MG tablet Take 81 mg by mouth at bedtime.     . cholecalciferol (VITAMIN D3) 25 MCG (1000 UNIT) tablet Take 1,000 Units by mouth daily.    . citalopram (CELEXA) 20 MG tablet Take 20 mg by mouth at bedtime.     . cyclobenzaprine (FLEXERIL) 10 MG tablet Take 10 mg by mouth  3 (three) times daily as needed for muscle spasms.    [name] ezetimibe (ZETIA) 10 MG tablet Take 10 mg by mouth daily.    . Fluticasone-Salmeterol (ADVAIR) 250-50 MCG/DOSE AEPB Inhale 1 puff into the lungs every 12 (twelve) hours.    . furosemide (LASIX) 20 MG tablet Take 20 mg by mouth daily.    [name] gabapentin (NEURONTIN) 300 MG capsule Take 300 mg by mouth 3 (three) times daily.  2  . insulin aspart (NOVOLOG FLEXPEN) 100 UNIT/ML FlexPen Inject 0-30 Units into the skin 3 (three) times daily as needed for high blood sugar (CBG >150). Per sliding scale    . isosorbide mononitrate (IMDUR) 30 MG 24 hr tablet Take 30 mg by mouth at bedtime.  4  . Melatonin 5 MG TABS Take 10 mg by mouth at bedtime.     [name]  metFORMIN (GLUCOPHAGE) 1000 MG tablet Take 1 tablet (1,000 mg total) by mouth 2 (two) times daily. For the next 2 days as you did receive IV contrast, then resume on 11/26/2018 (Patient taking differently: Take 1,000 mg by mouth 2 (two) times daily. ) 60 tablet 11  . metoprolol tartrate (LOPRESSOR) 50 MG tablet Take 50 mg by mouth 2 (two) times daily.     [name] omeprazole (PRILOSEC) 20 MG capsule Take 20 mg by mouth 2 (two) times daily.    . pravastatin (PRAVACHOL) 20 MG tablet Take 1 tablet (20 mg total) by mouth daily. 30 tablet 0  . topiramate (TOPAMAX) 50 MG tablet Take 50 mg by mouth at bedtime.     [name] FLEXTOUCH 100 UNIT/ML SOPN FlexTouch Pen Inject 60-70 Units into the skin in the morning and at bedtime. Inject 60 units in the AM and 70 units in the PM.  5  . vitamin B-12 (CYANOCOBALAMIN) 1000 MCG tablet Take 1,000 mcg by mouth daily.     [name] zinc gluconate 50 MG tablet Take 50 mg by mouth daily.    [name] albuterol (PROVENTIL HFA;VENTOLIN HFA) 108 (90 BASE) MCG/ACT inhaler Inhale 2 puffs into the lungs every 6 (six) hours as needed for wheezing or shortness of breath.     . ergocalciferol (VITAMIN D2) 1.25 MG (50000 UT) capsule Take 1 capsule (50,000 Units total) by mouth once a week. (Patient not taking:  Reported on 09/23/2020) 20 capsule 1  . ferrous sulfate 325 (65 FE) MG EC tablet Take 325 mg by mouth once a week.  (Patient not taking: Reported on 09/23/2020)    . fluticasone (FLONASE) 50 MCG/ACT nasal spray Place 1 spray into the nose 2 (two) times daily.     . nitroGLYCERIN (NITROSTAT) 0.4 MG SL tablet Place 1 tablet (0.4 mg total) under the tongue every 5 (five) minutes x 3 doses as needed for chest pain (if no relief after 3rd dose, proceed to the ED for an evaluation or call 911). 25 tablet 3  . potassium chloride SA (KLOR-CON) 20 MEQ tablet Take 1 tablet (20 mEq total) by mouth 2 (two) times daily. (Patient not taking: Reported on 09/23/2020) 10 tablet 0    Results for orders placed or performed during the hospital encounter of 09/27/20 (from the past 48 hour(s))  Glucose, capillary     Status: None   Collection Time: 09/27/20  9:06 AM  Result Value Ref Range   Glucose-Capillary 80 70 - 99 mg/dL    Comment: Glucose reference range applies only to samples taken after fasting for at least 8 hours.   No results found.  Review of Systems  Constitutional: Negative.   HENT: Negative.   Eyes: Negative.   Respiratory: Negative.   Cardiovascular: Negative.   Gastrointestinal: Negative.   Endocrine: Negative.   Genitourinary: Negative.   Musculoskeletal: Negative.   Skin: Negative.   Allergic/Immunologic: Negative.   Neurological: Negative.   Hematological: Negative.   Psychiatric/Behavioral: Negative.     Blood pressure 140/78, pulse 73, temperature 98.2 F (36.8 C), temperature source Oral, resp. rate 12, height 5' 9"  (1.753 m), weight 100.2 kg, SpO2 100 %. Physical Exam  GENERAL: The patient is AO x3, in no acute distress. HEENT: Head is normocephalic and atraumatic. EOMI are intact. Mouth is well hydrated and without lesions. NECK: Supple. No masses LUNGS: Clear to auscultation. No presence of rhonchi/wheezing/rales. Adequate chest expansion HEART: RRR, normal s1 and  s2. ABDOMEN: Soft, nontender, no guarding, no peritoneal  signs, and nondistended. BS +. No masses.  EXTREMITIES: Without any cyanosis, clubbing, rash, lesions or edema. NEUROLOGIC: AOx3, no focal motor deficit. SKIN: no jaundice, no rashes  Assessment/Plan JANSSEN ZEE is a 64 y.o. male w with past medical history of recent COVID-19 infection, myocardial infarction status post CABG, anxiety, asthma, [name] cirrhosis, hyperlipidemia, OSA, and type 2 diabetes, who comes to the hospital for screening of esophageal varices and colorectal cancer screening.  We will proceed with colonoscopy and EGD.  [name], MD 09/27/2020, 9:57 AM

## 2020-09-27 NOTE — Anesthesia Preprocedure Evaluation (Signed)
Anesthesia Evaluation  Patient identified by MRN, date of birth, ID band Patient awake    Reviewed: Allergy & Precautions, NPO status , Patient's Chart, lab work & pertinent test results  History of Anesthesia Complications Negative for: history of anesthetic complications  Airway Mallampati: III  TM Distance: >3 FB Neck ROM: Full   Comment: ACDF Dental  (+) Dental Advisory Given, Missing, Chipped   Pulmonary asthma , sleep apnea and Continuous Positive Airway Pressure Ventilation ,    Pulmonary exam normal breath sounds clear to auscultation       Cardiovascular hypertension, Pt. on medications + angina with exertion + CAD, + Past MI, + Cardiac Stents, + CABG and +CHF  Normal cardiovascular exam+ dysrhythmias (pvcs)  Rhythm:Regular Rate:Normal  CATH 2020 - 1. Severe 3 vessel obstructive CAD 2. Patent LIMA to the distal LAD 3. Patent free RIMA to the OM1 4. Patent SVG to PDA 5. Normal LVEDP  Compared to prior angiogram dated 08/09/17 there is no significant change. Recommend continued medical therapy.     Neuro/Psych  Headaches, Anxiety TIACVA    GI/Hepatic hiatal hernia, GERD  Medicated,(+) Cirrhosis       , Hepatitis -  Endo/Other  diabetes, Well Controlled, Type 2  Renal/GU Renal disease     Musculoskeletal  (+) Arthritis  (back pain), ACDF   Abdominal   Peds  Hematology  (+) Blood dyscrasia (thrombocytopenia), anemia ,   Anesthesia Other Findings   Reproductive/Obstetrics                             Anesthesia Physical Anesthesia Plan  ASA: III  Anesthesia Plan: General   Post-op Pain Management:    Induction: Intravenous  PONV Risk Score and Plan: TIVA  Airway Management Planned: Nasal Cannula, Natural Airway and Simple Face Mask  Additional Equipment:   Intra-op Plan:   Post-operative Plan:   Informed Consent: I have reviewed the patients History and  Physical, chart, labs and discussed the procedure including the risks, benefits and alternatives for the proposed anesthesia with the patient or authorized representative who has indicated his/her understanding and acceptance.     Dental advisory given  Plan Discussed with: CRNA and Surgeon  Anesthesia Plan Comments:         Anesthesia Quick Evaluation

## 2020-10-02 ENCOUNTER — Encounter (HOSPITAL_COMMUNITY): Payer: Self-pay | Admitting: Gastroenterology

## 2020-10-02 ENCOUNTER — Encounter (INDEPENDENT_AMBULATORY_CARE_PROVIDER_SITE_OTHER): Payer: Self-pay | Admitting: *Deleted

## 2020-10-25 ENCOUNTER — Ambulatory Visit: Payer: Medicare Other | Admitting: Cardiology

## 2020-10-25 NOTE — Progress Notes (Deleted)
Cardiology Office Note  Date: 10/25/2020   ID: [name] [name], [name] [name] 06-25-1956, MRN [id number]  PCP:  Dorothyann Peng, FNP  Cardiologist:  No primary care provider on file. Electrophysiologist:  None   No chief complaint on file.   History of Present Illness: [name] [name] is a 64 y.o. male last seen in June.  Past Medical History:  Diagnosis Date  . Anxiety   . Arthritis   . Asthma   . Cataract    Right eye  . Chronic lower back pain   . Cirrhosis of liver ([place])   . Coronary atherosclerosis of native coronary artery    a. LAD & OM stenting;  b. 2007 Cath: nonobs dzs, patent stents;  c. 07/2012 neg MV, EF 61%.  d. 11/20/14: [place] s/p  PCI w/ DES to pLAD and DES to OM1   . Essential hypertension   . GERD (gastroesophageal reflux disease)   . Hearing loss of left ear   . History of gout   . History of hiatal hernia   . Hypercholesteremia   . Lumbar herniated disc   . MI, old 2017  . Migraine   . OSA (obstructive sleep apnea)   . 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 Thrombocytopenia ([place])   . TIA (transient ischemic attack) ~ 2010  . Type 2 diabetes mellitus ([place])     Past Surgical History:  Procedure Laterality Date  . ANTERIOR CERVICAL DECOMP/DISCECTOMY FUSION  1998   "C3-4"  . CARDIAC CATHETERIZATION  "several"  . CARDIAC CATHETERIZATION N/A 12/30/2015   Procedure: Left Heart Cath and Coronary Angiography;  Surgeon: [name], MD;  Location: [facility];  Service: Cardiovascular;  Laterality: N/A;  . CARPAL TUNNEL RELEASE Bilateral 2005  . CHOLECYSTECTOMY N/A 11/28/2018   Procedure: LAPAROSCOPIC CHOLECYSTECTOMY;  Surgeon: [name], MD;  Location: [facility];  Service: General;  Laterality: N/A;  . COLONOSCOPY    . COLONOSCOPY WITH PROPOFOL N/A 09/27/2020   Procedure: COLONOSCOPY WITH PROPOFOL;  Surgeon: [name], MD;  Location: [facility];  Service:  Gastroenterology;  Laterality: N/A;  . CORONARY ANGIOPLASTY    . CORONARY ANGIOPLASTY WITH STENT PLACEMENT  2002; 2003; 11/20/2014   "I have 4 stents after today" (11/20/2014)  . CORONARY ARTERY BYPASS GRAFT N/A 01/09/2016   Procedure: CORONARY ARTERY BYPASS GRAFTING (CABG) X 3 UTILIZING RIGHT AND LEFT INTERNAL MAMMARY ARTERY AND ENDOSCOPICALLY HARVESTED SAPHENEOUS VEIN.;  Surgeon: Rexene Alberts, MD;  Location: [facility];  Service: Open Heart Surgery;  Laterality: N/A;  . ESOPHAGOGASTRODUODENOSCOPY    . ESOPHAGOGASTRODUODENOSCOPY (EGD) WITH PROPOFOL N/A 09/27/2020   Procedure: ESOPHAGOGASTRODUODENOSCOPY (EGD) WITH PROPOFOL;  Surgeon: [name], MD;  [facility];  Service: Gastroenterology;  Laterality: N/A;  10  . KNEE SURGERY Left 02/2012   "scraped; open"  . LEFT HEART CATH AND CORS/GRAFTS ANGIOGRAPHY N/A 08/09/2017   Procedure: LEFT HEART CATH AND CORS/GRAFTS ANGIOGRAPHY;  Surgeon: [name], MD;  Location: [facility];  Service: Cardiovascular;  Laterality: N/A;  . LEFT HEART CATH AND CORS/GRAFTS ANGIOGRAPHY N/A 11/22/2018   Procedure: LEFT HEART CATH AND CORS/GRAFTS ANGIOGRAPHY;  Surgeon: [name], [name], MD;  Location: [facility];  Service: Cardiovascular;  Laterality: N/A;  . LEFT HEART CATHETERIZATION WITH CORONARY ANGIOGRAM N/A 07/20/2012   Procedure: LEFT HEART CATHETERIZATION WITH CORONARY ANGIOGRAM;  Surgeon: [name], MD;  Location: [facility] LAB;  Service: Cardiovascular;  Laterality: N/A;  . LEFT HEART CATHETERIZATION WITH CORONARY ANGIOGRAM N/A 11/20/2014   Procedure: LEFT HEART CATHETERIZATION WITH CORONARY ANGIOGRAM;  Surgeon: [name], MD;  Location: [facility] CATH LAB;  Service: Cardiovascular;  Laterality: N/A;  . LEFT HEART CATHETERIZATION WITH CORONARY ANGIOGRAM N/A 11/26/2014   Procedure: LEFT HEART CATHETERIZATION WITH CORONARY ANGIOGRAM;  Surgeon: [name], MD;  Location: [facility] CATH LAB;  Service: Cardiovascular;  Laterality:  N/A;  . NEUROPLASTY / TRANSPOSITION ULNAR NERVE AT ELBOW Right ~ 2012  . PERCUTANEOUS CORONARY ROTOBLATOR INTERVENTION (PCI-R)  11/20/2014   Procedure: PERCUTANEOUS CORONARY ROTOBLATOR INTERVENTION (PCI-R);  Surgeon: [name], MD;  Location: [facility] CATH LAB;  Service: Cardiovascular;;  . SHOULDER ARTHROSCOPY Left ~ 2011  . TEE WITHOUT CARDIOVERSION N/A 01/09/2016   Procedure: TRANSESOPHAGEAL ECHOCARDIOGRAM (TEE);  Surgeon: Rexene Alberts, MD;  Location: [facility];  Service: Open Heart Surgery;  Laterality: N/A;    Current Outpatient Medications  Medication Sig Dispense Refill  . albuterol (PROVENTIL HFA;VENTOLIN HFA) 108 (90 BASE) MCG/ACT inhaler Inhale 2 puffs into the lungs every 6 (six) hours as needed for wheezing or shortness of breath.     [name] aspirin EC 81 MG tablet Take 81 mg by mouth at bedtime.     . carvedilol (COREG) 3.125 MG tablet Take 1 tablet (3.125 mg total) by mouth 2 (two) times daily. 60 tablet 11  . cholecalciferol (VITAMIN D3) 25 MCG (1000 UNIT) tablet Take 1,000 Units by mouth daily.    . citalopram (CELEXA) 20 MG tablet Take 20 mg by mouth at bedtime.     . cyclobenzaprine (FLEXERIL) 10 MG tablet Take 10 mg by mouth 3 (three) times daily as needed for muscle spasms.    . ergocalciferol (VITAMIN D2) 1.25 MG (50000 UT) capsule Take 1 capsule (50,000 Units total) by mouth once a week. (Patient not taking: Reported on 09/23/2020) 20 capsule 1  . ezetimibe (ZETIA) 10 MG tablet Take 10 mg by mouth daily.    . ferrous sulfate 325 (65 FE) MG EC tablet Take 325 mg by mouth once a week.  (Patient not taking: Reported on 09/23/2020)    . fluticasone (FLONASE) 50 MCG/ACT nasal spray Place 1 spray into the nose 2 (two) times daily.     . Fluticasone-Salmeterol (ADVAIR) 250-50 MCG/DOSE AEPB Inhale 1 puff into the lungs every 12 (twelve) hours.    . furosemide (LASIX) 20 MG tablet Take 20 mg by mouth daily.    [name] gabapentin (NEURONTIN) 300 MG capsule Take 300 mg by mouth 3 (three) times  daily.  2  . insulin aspart (NOVOLOG FLEXPEN) 100 UNIT/ML FlexPen Inject 0-30 Units into the skin 3 (three) times daily as needed for high blood sugar (CBG >150). Per sliding scale    . isosorbide mononitrate (IMDUR) 30 MG 24 hr tablet Take 30 mg by mouth at bedtime.  4  . Melatonin 5 MG TABS Take 10 mg by mouth at bedtime.     . metFORMIN (GLUCOPHAGE) 1000 MG tablet Take 1 tablet (1,000 mg total) by mouth 2 (two) times daily. For the next 2 days as you did receive IV contrast, then resume on 11/26/2018 (Patient taking differently: Take 1,000 mg by mouth 2 (two) times daily. ) 60 tablet 11  . nitroGLYCERIN (NITROSTAT) 0.4 MG SL tablet Place 1 tablet (0.4 mg total) under the tongue every 5 (five) minutes x 3 doses as needed for  chest pain (if no relief after 3rd dose, proceed to the ED for an evaluation or call 911). 25 tablet 3  . omeprazole (PRILOSEC) 20 MG capsule Take 20 mg by mouth 2 (two) times daily.    . potassium chloride SA (KLOR-CON) 20 MEQ tablet Take 1 tablet (20 mEq total) by mouth 2 (two) times daily. (Patient not taking: Reported on 09/23/2020) 10 tablet 0  . pravastatin (PRAVACHOL) 20 MG tablet Take 1 tablet (20 mg total) by mouth daily. 30 tablet 0  . topiramate (TOPAMAX) 50 MG tablet Take 50 mg by mouth at bedtime.     [name] FLEXTOUCH 100 UNIT/ML SOPN FlexTouch Pen Inject 60-70 Units into the skin in the morning and at bedtime. Inject 60 units in the AM and 70 units in the PM.  5  . vitamin B-12 (CYANOCOBALAMIN) 1000 MCG tablet Take 1,000 mcg by mouth daily.     [name] zinc gluconate 50 MG tablet Take 50 mg by mouth daily.     No current facility-administered medications for this visit.   Allergies:  Divalproex sodium, Statins, Tramadol, Valproic acid, Gadolinium derivatives, and Tricor [fenofibrate]   Social History: The patient  reports that he has never smoked. He has never used smokeless tobacco. He reports that he does not drink alcohol and does not use drugs.   Family  History: The patient's family history includes Asthma in his sister and sister; Autism in his son; Cancer in his paternal grandfather; Coronary artery disease (age of onset: 20) in his father; Heart attack in his paternal grandmother; Heart disease in his paternal uncle; Migraines in his son and son; Multiple sclerosis in his brother; Seizures in his son; Stroke in his maternal grandmother and mother.   ROS:  Please see the history of present illness. Otherwise, complete review of systems is positive for {NONE DEFAULTED:18576::"none"}.  All other systems are reviewed and negative.   Physical Exam: VS:  There were no vitals taken for this visit., BMI There is no height or weight on file to calculate BMI.  Wt Readings from Last 3 Encounters:  09/27/20 221 lb (100.2 kg)  09/25/20 227 lb (103 kg)  08/15/20 213 lb 14.4 oz (97 kg)    General: Patient appears comfortable at rest. HEENT: Conjunctiva and lids normal, oropharynx clear with moist mucosa. Neck: Supple, no elevated JVP or carotid bruits, no thyromegaly. Lungs: Clear to auscultation, nonlabored breathing at rest. Cardiac: Regular rate and rhythm, no S3 or significant systolic murmur, no pericardial rub. Abdomen: Soft, nontender, no hepatomegaly, bowel sounds present, no guarding or rebound. Extremities: No pitting edema, distal pulses 2+. Skin: Warm and dry. Musculoskeletal: No kyphosis. Neuropsychiatric: Alert and oriented x3, affect grossly appropriate.  ECG:  An ECG dated 07/27/2020 was personally reviewed today and demonstrated:  Sinus rhythm with repolarization changes.  Recent Labwork: 02/20/2020: Magnesium 1.8; TSH 2.203 08/15/2020: ALT 44; AST 40; BUN 25; Creat 1.04; Hemoglobin 15.3; Platelets 124; Potassium 4.7; Sodium 141     Component Value Date/Time   CHOL 150 02/20/2020 0452   TRIG 191 (H) 02/20/2020 0452   HDL 26 (L) 02/20/2020 0452   CHOLHDL 5.8 02/20/2020 0452   VLDL 38 02/20/2020 0452   LDLCALC 86 02/20/2020 0452     Other Studies Reviewed Today:  Echocardiogram 02/20/2020: 1. Left ventricular ejection fraction, by estimation, is 55 to 60%. The  left ventricle has normal function. The left ventricle has no regional  wall motion abnormalities. There is mild left ventricular hypertrophy.  Left  ventricular diastolic parameters  were normal.  2. Right ventricular systolic function is normal. The right ventricular  size is normal. There is mildly elevated pulmonary artery systolic  pressure. The estimated right ventricular systolic pressure is 01.1 mmHg.  3. No right to left interatrial shunting noted with saline contrast.  4. The mitral valve is grossly normal. Trivial mitral valve  regurgitation.  5. The aortic valve is tricuspid. Aortic valve regurgitation is mild.  Mild aortic valve sclerosis is present, with no evidence of aortic valve  stenosis.  6. The inferior vena cava is dilated in size with >50% respiratory  variability, suggesting right atrial pressure of 8 mmHg.   Assessment and Plan:    Medication Adjustments/Labs and Tests Ordered: Current medicines are reviewed at length with the patient today.  Concerns regarding medicines are outlined above.   Tests Ordered: No orders of the defined types were placed in this encounter.   Medication Changes: No orders of the defined types were placed in this encounter.   Disposition:  Follow up {follow up:15908}  Signed, [name], MD, [facility] 10/25/2020 10:43 AM    [facility] [place], [place], [place] [address] Phone: [phone]; Fax: [phone]

## 2020-11-29 ENCOUNTER — Emergency Department (HOSPITAL_COMMUNITY): Payer: No Typology Code available for payment source

## 2020-11-29 ENCOUNTER — Emergency Department (HOSPITAL_COMMUNITY)
Admission: EM | Admit: 2020-11-29 | Discharge: 2020-11-29 | Disposition: A | Discharge status: Home / Self Care | Payer: No Typology Code available for payment source | Attending: Emergency Medicine | Admitting: Emergency Medicine

## 2020-11-29 ENCOUNTER — Other Ambulatory Visit: Payer: Self-pay

## 2020-11-29 ENCOUNTER — Encounter (HOSPITAL_COMMUNITY): Payer: Self-pay | Admitting: Emergency Medicine

## 2020-11-29 DIAGNOSIS — J45909 Unspecified asthma, uncomplicated: Secondary | ICD-10-CM | POA: Diagnosis not present

## 2020-11-29 DIAGNOSIS — Z7951 Long term (current) use of inhaled steroids: Secondary | ICD-10-CM | POA: Diagnosis not present

## 2020-11-29 DIAGNOSIS — Z955 Presence of coronary angioplasty implant and graft: Secondary | ICD-10-CM | POA: Diagnosis not present

## 2020-11-29 DIAGNOSIS — Z8673 Personal history of transient ischemic attack (TIA), and cerebral infarction without residual deficits: Secondary | ICD-10-CM | POA: Diagnosis not present

## 2020-11-29 DIAGNOSIS — S66911A Strain of unspecified muscle, fascia and tendon at wrist and hand level, right hand, initial encounter: Secondary | ICD-10-CM | POA: Diagnosis not present

## 2020-11-29 DIAGNOSIS — Z794 Long term (current) use of insulin: Secondary | ICD-10-CM | POA: Insufficient documentation

## 2020-11-29 DIAGNOSIS — S6991XA Unspecified injury of right wrist, hand and finger(s), initial encounter: Secondary | ICD-10-CM | POA: Diagnosis present

## 2020-11-29 DIAGNOSIS — I5031 Acute diastolic (congestive) heart failure: Secondary | ICD-10-CM | POA: Insufficient documentation

## 2020-11-29 DIAGNOSIS — W19XXXA Unspecified fall, initial encounter: Secondary | ICD-10-CM

## 2020-11-29 DIAGNOSIS — W010XXA Fall on same level from slipping, tripping and stumbling without subsequent striking against object, initial encounter: Secondary | ICD-10-CM | POA: Diagnosis not present

## 2020-11-29 DIAGNOSIS — Z7984 Long term (current) use of oral hypoglycemic drugs: Secondary | ICD-10-CM | POA: Insufficient documentation

## 2020-11-29 DIAGNOSIS — S4991XA Unspecified injury of right shoulder and upper arm, initial encounter: Secondary | ICD-10-CM | POA: Diagnosis not present

## 2020-11-29 DIAGNOSIS — I25110 Atherosclerotic heart disease of native coronary artery with unstable angina pectoris: Secondary | ICD-10-CM | POA: Diagnosis not present

## 2020-11-29 DIAGNOSIS — Z7982 Long term (current) use of aspirin: Secondary | ICD-10-CM | POA: Insufficient documentation

## 2020-11-29 DIAGNOSIS — I110 Hypertensive heart disease with heart failure: Secondary | ICD-10-CM | POA: Insufficient documentation

## 2020-11-29 DIAGNOSIS — Y990 Civilian activity done for income or pay: Secondary | ICD-10-CM | POA: Diagnosis not present

## 2020-11-29 DIAGNOSIS — E119 Type 2 diabetes mellitus without complications: Secondary | ICD-10-CM | POA: Insufficient documentation

## 2020-11-29 DIAGNOSIS — Z79899 Other long term (current) drug therapy: Secondary | ICD-10-CM | POA: Insufficient documentation

## 2020-11-29 NOTE — ED Triage Notes (Signed)
Pt fell at work today and hit his rt shoulder. He has some abrasions on his rt elbow.

## 2020-11-29 NOTE — Discharge Instructions (Signed)
Your x-rays today did not show any evidence of broken bones.  You may wear the wrist splint as needed for support.  You may remove at nighttime for bathing.  Tylenol if needed for pain.  Follow-up with one of the orthopedic providers listed in 1 week if not improving.

## 2020-11-30 NOTE — ED Provider Notes (Signed)
[facility] EMERGENCY DEPARTMENT Provider Note   CSN: [id number] Arrival date & time: 11/29/20  1044     History Chief Complaint  Patient presents with  . Fall    [name] [name] is a 65 y.o. male.  HPI     [name] [name] is a 65 y.o. male who presents to the Emergency Department complaining of right shoulder pain and right wrist pain.  States he slipped and fell while at work.  He describes an aching pain to the lateral shoulder that worsens with movement and pain to the distal wrist.  He has two abrasions to the right forearm, but denies pain of the forearm or elbow. Denies numbness or weakness of the extremity.  He denies head injury, LOC, headache, dizziness, nausea or vomiting.  No neck or back pain.     Past Medical History:  Diagnosis Date  . Anxiety   . Arthritis   . Asthma   . Cataract    Right eye  . Chronic lower back pain   . Cirrhosis of liver ([place])   . Coronary atherosclerosis of native coronary artery    a. LAD & OM stenting;  b. 2007 Cath: nonobs dzs, patent stents;  c. 07/2012 neg MV, EF 61%.  d. 11/20/14: [place] s/p  PCI w/ DES to pLAD and DES to OM1   . Essential hypertension   . GERD (gastroesophageal reflux disease)   . Hearing loss of left ear   . History of gout   . History of hiatal hernia   . Hypercholesteremia   . Lumbar herniated disc   . MI, old 2017  . Migraine   . OSA (obstructive sleep apnea)   . 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 Thrombocytopenia ([place])   . TIA (transient ischemic attack) ~ 2010  . Type 2 diabetes mellitus [facility])     Patient Active Problem List   Diagnosis Date Noted  . 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) ([place]) 02/19/2020  . Liver cirrhosis secondary to NASH ([place]) 09/13/2019  . GERD (gastroesophageal reflux disease) 09/13/2019  . Dysphagia 09/13/2019  . DDD (degenerative disc disease), cervical 04/13/2019  . RUQ  abdominal pain 11/28/2018  . Calculus of gallbladder with acute cholecystitis without obstruction   . Acute diastolic CHF (congestive heart failure) ([place]) 11/26/2018  . Right upper quadrant abdominal pain 11/26/2018  . Acute respiratory failure with hypoxia ([place]) 11/26/2018  . Pancytopenia, acquired ([place])   . Acute kidney injury ([place]) 11/20/2018  . Aphasia 07/09/2018  . Cerebral thrombosis with cerebral infarction 07/09/2018  . Chest pain 08/10/2017  . 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 Coronary artery disease involving native coronary artery with unstable angina pectoris ([place])   . CAD -S/P PCI LAD OM/LAD '13 and 2016 12/31/2015  . Hyperlipidemia   . Chest pain with high risk of acute coronary syndrome 12/23/2015  . Precordial chest pain 12/03/2014  . TIA (transient ischemic attack)   . CAD- diffuse LAD disease at cath 12/30/15   . Thrombocytopenia ([place])   . Fever 12/04/2013  . Accelerating angina ([place]) 12/03/2013  . Type 2 diabetes mellitus ([place])   . Anxiety   . PVC's (premature ventricular contractions) 01/06/2013  . Obstructive sleep apnea-declines C-pap 07/04/2012  . Mixed hyperlipidemia 07/30/2009  . Essential hypertension, benign 07/30/2009    Past Surgical History:  Procedure Laterality Date  .  ANTERIOR CERVICAL DECOMP/DISCECTOMY FUSION  1998   "C3-4"  . CARDIAC CATHETERIZATION  "several"  . CARDIAC CATHETERIZATION N/A 12/30/2015   Procedure: Left Heart Cath and Coronary Angiography;  Surgeon: [name], MD;  Location: [place] CV LAB;  Service: Cardiovascular;  Laterality: N/A;  . CARPAL TUNNEL RELEASE Bilateral 2005  . CHOLECYSTECTOMY N/A 11/28/2018   Procedure: LAPAROSCOPIC CHOLECYSTECTOMY;  Surgeon: [name], MD;  Location: [facility] ORS;  Service: General;  Laterality: N/A;  . COLONOSCOPY    . COLONOSCOPY WITH PROPOFOL N/A 09/27/2020   Procedure: COLONOSCOPY WITH PROPOFOL;  Surgeon: [name], MD;  Location: [facility] ENDO SUITE;  Service:  Gastroenterology;  Laterality: N/A;  . CORONARY ANGIOPLASTY    . CORONARY ANGIOPLASTY WITH STENT PLACEMENT  2002; 2003; 11/20/2014   "I have 4 stents after today" (11/20/2014)  . CORONARY ARTERY BYPASS GRAFT N/A 01/09/2016   Procedure: CORONARY ARTERY BYPASS GRAFTING (CABG) X 3 UTILIZING RIGHT AND LEFT INTERNAL MAMMARY ARTERY AND ENDOSCOPICALLY HARVESTED SAPHENEOUS VEIN.;  Surgeon: Rexene Alberts, MD;  Location: [place];  Service: Open Heart Surgery;  Laterality: N/A;  . ESOPHAGOGASTRODUODENOSCOPY    . ESOPHAGOGASTRODUODENOSCOPY (EGD) WITH PROPOFOL N/A 09/27/2020   Procedure: ESOPHAGOGASTRODUODENOSCOPY (EGD) WITH PROPOFOL;  Surgeon: [name], MD;  Location: [facility];  Service: Gastroenterology;  Laterality: N/A;  10  . KNEE SURGERY Left 02/2012   "scraped; open"  . LEFT HEART CATH AND CORS/GRAFTS ANGIOGRAPHY N/A 08/09/2017   Procedure: LEFT HEART CATH AND CORS/GRAFTS ANGIOGRAPHY;  Surgeon: [name], MD;  Location: [place] CV LAB;  Service: Cardiovascular;  Laterality: N/A;  . LEFT HEART CATH AND CORS/GRAFTS ANGIOGRAPHY N/A 11/22/2018   Procedure: LEFT HEART CATH AND CORS/GRAFTS ANGIOGRAPHY;  Surgeon: [name], [name], MD;  Location: [place] CV LAB;  Service: Cardiovascular;  Laterality: N/A;  . LEFT HEART CATHETERIZATION WITH CORONARY ANGIOGRAM N/A 07/20/2012   Procedure: LEFT HEART CATHETERIZATION WITH CORONARY ANGIOGRAM;  Surgeon: [name], MD;  Location: [place] CATH LAB;  Service: Cardiovascular;  Laterality: N/A;  . LEFT HEART CATHETERIZATION WITH CORONARY ANGIOGRAM N/A 11/20/2014   Procedure: LEFT HEART CATHETERIZATION WITH CORONARY ANGIOGRAM;  Surgeon: [name], MD;  Location: [facility] CATH LAB;  Service: Cardiovascular;  Laterality: N/A;  . LEFT HEART CATHETERIZATION WITH CORONARY ANGIOGRAM N/A 11/26/2014   Procedure: LEFT HEART CATHETERIZATION WITH CORONARY ANGIOGRAM;  Surgeon: [name], MD;  Location: [facility] CATH LAB;  Service: Cardiovascular;  Laterality:  N/A;  . NEUROPLASTY / TRANSPOSITION ULNAR NERVE AT ELBOW Right ~ 2012  . PERCUTANEOUS CORONARY ROTOBLATOR INTERVENTION (PCI-R)  11/20/2014   Procedure: PERCUTANEOUS CORONARY ROTOBLATOR INTERVENTION (PCI-R);  Surgeon: [name], MD;  Location: [facility] CATH LAB;  Service: Cardiovascular;;  . SHOULDER ARTHROSCOPY Left ~ 2011  . TEE WITHOUT CARDIOVERSION N/A 01/09/2016   Procedure: TRANSESOPHAGEAL ECHOCARDIOGRAM (TEE);  Surgeon: Rexene Alberts, MD;  Location: [place];  Service: Open Heart Surgery;  Laterality: N/A;       Family History  Problem Relation Age of Onset  . Stroke Mother   . Coronary artery disease Father 80  . Asthma Sister   . Multiple sclerosis Brother   . Heart disease Paternal Uncle   . Stroke Maternal Grandmother   . Heart attack Paternal Grandmother   . Cancer Paternal Grandfather   . Asthma Sister   . Seizures Son   . Migraines Son   . Autism Son   . Migraines Son     Social History   Tobacco Use  .  Smoking status: Never Smoker  . Smokeless tobacco: Never Used  Vaping Use  . Vaping Use: Never used  Substance Use Topics  . Alcohol use: No  . Drug use: No    Home Medications Prior to Admission medications   Medication Sig Start Date End Date Taking? Authorizing Provider  albuterol (PROVENTIL HFA;VENTOLIN HFA) 108 (90 BASE) MCG/ACT inhaler Inhale 2 puffs into the lungs every 6 (six) hours as needed for wheezing or shortness of breath.     [provider]  aspirin EC 81 MG tablet Take 81 mg by mouth at bedtime.     [provider]  carvedilol (COREG) 3.125 MG tablet Take 1 tablet (3.125 mg total) by mouth 2 (two) times daily. 09/27/20 09/27/21  [name], MD  cholecalciferol (VITAMIN D3) 25 MCG (1000 UNIT) tablet Take 1,000 Units by mouth daily.    [provider]  citalopram (CELEXA) 20 MG tablet Take 20 mg by mouth at bedtime.     [provider]  cyclobenzaprine (FLEXERIL) 10 MG tablet Take 10 mg by mouth 3  (three) times daily as needed for muscle spasms. 08/15/20   [provider]  ergocalciferol (VITAMIN D2) 1.25 MG (50000 UT) capsule Take 1 capsule (50,000 Units total) by mouth once a week. Patient not taking: Reported on 09/23/2020 05/03/19   Francene Finders L, NP-C  ezetimibe (ZETIA) 10 MG tablet Take 10 mg by mouth daily.    [provider]  ferrous sulfate 325 (65 FE) MG EC tablet Take 325 mg by mouth once a week.  Patient not taking: Reported on 09/23/2020    [provider]  fluticasone (FLONASE) 50 MCG/ACT nasal spray Place 1 spray into the nose 2 (two) times daily.     [provider]  Fluticasone-Salmeterol (ADVAIR) 250-50 MCG/DOSE AEPB Inhale 1 puff into the lungs every 12 (twelve) hours.    [provider]  furosemide (LASIX) 20 MG tablet Take 20 mg by mouth daily. 10/14/19   [provider]  gabapentin (NEURONTIN) 300 MG capsule Take 300 mg by mouth 3 (three) times daily. 07/09/17   [provider]  insulin aspart (NOVOLOG FLEXPEN) 100 UNIT/ML FlexPen Inject 0-30 Units into the skin 3 (three) times daily as needed for high blood sugar (CBG >150). Per sliding scale    [provider]  isosorbide mononitrate (IMDUR) 30 MG 24 hr tablet Take 30 mg by mouth at bedtime. 06/16/18   [provider]  Melatonin 5 MG TABS Take 10 mg by mouth at bedtime.     [provider]  metFORMIN (GLUCOPHAGE) 1000 MG tablet Take 1 tablet (1,000 mg total) by mouth 2 (two) times daily. For the next 2 days as you did receive IV contrast, then resume on 11/26/2018 Patient taking differently: Take 1,000 mg by mouth 2 (two) times daily.  11/23/18   Elgergawy, Silver Huguenin, MD  nitroGLYCERIN (NITROSTAT) 0.4 MG SL tablet Place 1 tablet (0.4 mg total) under the tongue every 5 (five) minutes x 3 doses as needed for chest pain (if no relief after 3rd dose, proceed to the ED for an evaluation or call 911). 04/10/19 04/19/20  [name], MD   omeprazole (PRILOSEC) 20 MG capsule Take 20 mg by mouth 2 (two) times daily. 04/18/20   [provider]  potassium chloride SA (KLOR-CON) 20 MEQ tablet Take 1 tablet (20 mEq total) by mouth 2 (two) times daily. Patient not taking: Reported on 09/23/2020 07/27/20   Rolland Porter,  MD  pravastatin (PRAVACHOL) 20 MG tablet Take 1 tablet (20 mg total) by mouth daily. 02/20/20 09/23/20  Manuella Ghazi, Pratik D, DO  topiramate (TOPAMAX) 50 MG tablet Take 50 mg by mouth at bedtime.  04/18/20   [provider]  TRESIBA FLEXTOUCH 100 UNIT/ML SOPN FlexTouch Pen Inject 60-70 Units into the skin in the morning and at bedtime. Inject 60 units in the AM and 70 units in the PM. 08/11/18   [provider]  vitamin B-12 (CYANOCOBALAMIN) 1000 MCG tablet Take 1,000 mcg by mouth daily.     [provider]  zinc gluconate 50 MG tablet Take 50 mg by mouth daily.    [provider]    Allergies    Divalproex sodium, Statins, Tramadol, Valproic acid, Gadolinium derivatives, and Tricor [fenofibrate]  Review of Systems   Review of Systems  Constitutional: Negative for chills and fever.  Eyes: Negative for visual disturbance.  Respiratory: Negative for chest tightness.   Cardiovascular: Negative for chest pain.  Gastrointestinal: Negative for abdominal pain, nausea and vomiting.  Musculoskeletal: Positive for arthralgias (right shoulder and wrist pain). Negative for back pain, joint swelling and neck pain.  Skin: Positive for wound. Negative for color change.       Abrasions to right forearm  Neurological: Negative for dizziness, syncope, weakness, numbness and headaches.    Physical Exam Updated Vital Signs BP 124/68   Pulse 84   Temp 98.1 F (36.7 C) (Oral)   Resp 20   Ht 5' 9"  (1.753 m)   Wt 102.1 kg   SpO2 98%   BMI 33.23 kg/m   Physical Exam Vitals and nursing note reviewed.  Constitutional:      General: He is not in acute distress.    Appearance: Normal appearance.   HENT:     Head: Atraumatic.  Eyes:     Conjunctiva/sclera: Conjunctivae normal.     Pupils: Pupils are equal, round, and reactive to light.  Cardiovascular:     Rate and Rhythm: Normal rate and regular rhythm.     Pulses: Normal pulses.  Pulmonary:     Effort: Pulmonary effort is normal.     Breath sounds: Normal breath sounds.  Chest:     Chest wall: No tenderness.  Abdominal:     Palpations: Abdomen is soft.     Tenderness: There is no abdominal tenderness.  Musculoskeletal:        General: Tenderness and signs of injury present. No swelling or deformity.     Right shoulder: Tenderness present. No bony tenderness or crepitus. Normal strength. Normal pulse.     Cervical back: Normal range of motion. No tenderness.     Comments: Tenderness of the lateral right shoulder through ROM.  No edema.  Tenderness of the distal right wrist.  No edema or bony deformity.  Elbow non tender.   Skin:    General: Skin is warm.     Comments: Two small abrasions to the proximal right forearm.  No active bleeding or edema.    Neurological:     Mental Status: He is alert.     ED Results / Procedures / Treatments   Labs (all labs ordered are listed, but only abnormal results are displayed) Labs Reviewed - No data to display  EKG None  Radiology DG Shoulder Right  Result Date: 11/29/2020 CLINICAL DATA:  Pain following fall EXAM: RIGHT SHOULDER - 2+ VIEW COMPARISON:  None. FINDINGS: Oblique, Y scapular, and axillary images were obtained. No fracture  or dislocation. There is slight narrowing of the glenohumeral joint. Acromioclavicular joint appears unremarkable. No erosive change or intra-articular calcification. Visualized right lung clear. 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 No fracture or dislocation. Electronically Signed   By: Lowella Grip III M.D.   On: 11/29/2020 13:02   DG Wrist Complete Right  Result Date: 11/29/2020 CLINICAL DATA:  Pain following  fall EXAM: RIGHT WRIST - COMPLETE 3+ VIEW COMPARISON:  None. FINDINGS: Frontal, oblique, and lateral views were obtained. There is no fracture or dislocation. The joint spaces appear normal. There is a 4 mm cyst in the mid scaphoid, a benign finding. Trace calcification noted in the triangular fibrocartilage. IMPRESSION: No acute fracture or dislocation. No appreciable joint space narrowing. Trace calcification in the triangular fibrocartilage may represent mild arthropathy or residua of old trauma. Electronically Signed   By: Lowella Grip III M.D.   On: 11/29/2020 13:58    Procedures Procedures (including critical care time)  Medications Ordered in ED Medications - No data to display  ED Course  I have reviewed the triage vital signs and the nursing notes.  Pertinent labs & imaging results that were available during my care of the patient were reviewed by me and considered in my medical decision making (see chart for details).    MDM Rules/Calculators/A&P                         Pt with right shoulder and wrist secondary to a mechanical fall.  No head injury or LOC.  NV intact.  No bony deformities.    Abrasions of the right forearm cleaned and bandaged by me, no FB's.    XR's of the wrist and shoulder w/o acute bony injury.  Injuries likely musculoskeletal.  Agrees to symptomatic tx, close f/u with orthopedics.    Final Clinical Impression(s) / ED Diagnoses Final diagnoses:  Fall, initial encounter  Strain of right wrist, initial encounter  Injury of right shoulder, initial encounter    Rx / DC Orders ED Discharge Orders    None       Kem Parkinson, PA-C 11/30/20 2140    Lorelle Gibbs, DO 12/03/20 1342

## 2020-12-11 ENCOUNTER — Other Ambulatory Visit (HOSPITAL_COMMUNITY): Payer: Self-pay | Admitting: *Deleted

## 2020-12-11 DIAGNOSIS — D61818 Other pancytopenia: Secondary | ICD-10-CM

## 2020-12-11 DIAGNOSIS — E559 Vitamin D deficiency, unspecified: Secondary | ICD-10-CM

## 2020-12-11 DIAGNOSIS — D696 Thrombocytopenia, unspecified: Secondary | ICD-10-CM

## 2020-12-12 ENCOUNTER — Inpatient Hospital Stay (HOSPITAL_COMMUNITY): Payer: Medicare Other | Attending: Hematology

## 2020-12-17 ENCOUNTER — Ambulatory Visit (HOSPITAL_COMMUNITY): Payer: Medicare Other | Admitting: Oncology

## 2020-12-19 ENCOUNTER — Ambulatory Visit (HOSPITAL_COMMUNITY): Payer: Medicare Other | Admitting: Nurse Practitioner

## 2021-02-04 ENCOUNTER — Encounter (INDEPENDENT_AMBULATORY_CARE_PROVIDER_SITE_OTHER): Payer: Self-pay | Admitting: *Deleted

## 2021-02-13 ENCOUNTER — Ambulatory Visit: Payer: Medicare Other | Admitting: Cardiology

## 2021-02-13 ENCOUNTER — Other Ambulatory Visit: Payer: Self-pay

## 2021-02-13 ENCOUNTER — Encounter: Payer: Self-pay | Admitting: Cardiology

## 2021-02-13 VITALS — BP 138/58 | HR 83 | Ht 69.0 in | Wt 227.0 lb

## 2021-02-13 DIAGNOSIS — I25119 Atherosclerotic heart disease of native coronary artery with unspecified angina pectoris: Secondary | ICD-10-CM

## 2021-02-13 DIAGNOSIS — I10 Essential (primary) hypertension: Secondary | ICD-10-CM

## 2021-02-13 DIAGNOSIS — E782 Mixed hyperlipidemia: Secondary | ICD-10-CM | POA: Diagnosis not present

## 2021-02-13 NOTE — Patient Instructions (Addendum)

## 2021-02-13 NOTE — Progress Notes (Signed)
Cardiology Office Note  Date: 02/13/2021   ID: [name] [name], [name] [name] 02-15-1956, MRN [id number]  PCP:  Dorothyann Peng, FNP  Cardiologist:  Rozann Lesches, MD Electrophysiologist:  None   Chief Complaint  Patient presents with  . Cardiac follow-up    History of Present Illness: [name] [name] is a 65 y.o. male last seen in June 2021.  He is here today with his wife for a follow-up visit.  He states that since last encounter he has had no significant cardiac symptoms, specifically no angina or nitroglycerin use.  I reviewed his current cardiac regimen which is outlined below.  He states that he has been compliant with therapy, no obvious intolerances.  Past Medical History:  Diagnosis Date  . Anxiety   . Arthritis   . Asthma   . Cataract    Right eye  . Chronic lower back pain   . Cirrhosis of liver ([place])   . Coronary atherosclerosis of native coronary artery    a. LAD & OM stenting;  b. 2007 Cath: nonobs dzs, patent stents;  c. 07/2012 neg MV, EF 61%.  d. 11/20/14: [place] s/p  PCI w/ DES to pLAD and DES to OM1   . Essential hypertension   . GERD (gastroesophageal reflux disease)   . Hearing loss of left ear   . History of gout   . History of hiatal hernia   . Hypercholesteremia   . Lumbar herniated disc   . MI, old 2017  . Migraine   . OSA (obstructive sleep apnea)   . 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 Thrombocytopenia ([place])   . TIA (transient ischemic attack) ~ 2010  . Type 2 diabetes mellitus ([place])     Past Surgical History:  Procedure Laterality Date  . ANTERIOR CERVICAL DECOMP/DISCECTOMY FUSION  1998   "C3-4"  . CARDIAC CATHETERIZATION  "several"  . CARDIAC CATHETERIZATION N/A 12/30/2015   Procedure: Left Heart Cath and Coronary Angiography;  Surgeon: [name], MD;  Location: [place] [facility];  Service: Cardiovascular;  Laterality: N/A;  . CARPAL TUNNEL RELEASE Bilateral 2005  .  CHOLECYSTECTOMY N/A 11/28/2018   Procedure: LAPAROSCOPIC CHOLECYSTECTOMY;  Surgeon: [name], MD;  Location: [facility];  Service: General;  Laterality: N/A;  . COLONOSCOPY    . COLONOSCOPY WITH PROPOFOL N/A 09/27/2020   Procedure: COLONOSCOPY WITH PROPOFOL;  Surgeon: [name], MD;  Location: [facility];  Service: Gastroenterology;  Laterality: N/A;  . CORONARY ANGIOPLASTY    . CORONARY ANGIOPLASTY WITH STENT PLACEMENT  2002; 2003; 11/20/2014   "I have 4 stents after today" (11/20/2014)  . CORONARY ARTERY BYPASS GRAFT N/A 01/09/2016   Procedure: CORONARY ARTERY BYPASS GRAFTING (CABG) X 3 UTILIZING RIGHT AND LEFT INTERNAL MAMMARY ARTERY AND ENDOSCOPICALLY HARVESTED SAPHENEOUS VEIN.;  Surgeon: Rexene Alberts, MD;  Location: [facility];  Service: Open Heart Surgery;  Laterality: N/A;  . ESOPHAGOGASTRODUODENOSCOPY    . ESOPHAGOGASTRODUODENOSCOPY (EGD) WITH PROPOFOL N/A 09/27/2020   Procedure: ESOPHAGOGASTRODUODENOSCOPY (EGD) WITH PROPOFOL;  Surgeon: [name], MD;  [facility];  Service: Gastroenterology;  Laterality: N/A;  10  . KNEE SURGERY Left 02/2012   "scraped; open"  . LEFT HEART CATH AND CORS/GRAFTS ANGIOGRAPHY N/A 08/09/2017   Procedure: LEFT HEART CATH AND CORS/GRAFTS ANGIOGRAPHY;  Surgeon: [name], MD;  Location: [place] [facility];  Service: Cardiovascular;  Laterality: N/A;  . LEFT HEART CATH AND CORS/GRAFTS ANGIOGRAPHY N/A 11/22/2018   Procedure: LEFT HEART CATH AND CORS/GRAFTS ANGIOGRAPHY;  Surgeon: [name], [name], MD;  Location: [place] [facility];  Service: Cardiovascular;  Laterality: N/A;  . LEFT HEART CATHETERIZATION WITH CORONARY ANGIOGRAM N/A 07/20/2012   Procedure: LEFT HEART CATHETERIZATION WITH CORONARY ANGIOGRAM;  Surgeon: [name], MD;  Location: [place] [facility];  Service: Cardiovascular;  Laterality: N/A;  . LEFT HEART CATHETERIZATION WITH CORONARY ANGIOGRAM N/A 11/20/2014   Procedure: LEFT HEART CATHETERIZATION WITH  CORONARY ANGIOGRAM;  Surgeon: [name], MD;  Location: [facility] CATH LAB;  Service: Cardiovascular;  Laterality: N/A;  . LEFT HEART CATHETERIZATION WITH CORONARY ANGIOGRAM N/A 11/26/2014   Procedure: LEFT HEART CATHETERIZATION WITH CORONARY ANGIOGRAM;  Surgeon: [name], MD;  Location: [facility] C[facility] LAB;  Service: Cardiovascular;  Laterality: N/A;  . NEUROPLASTY / TRANSPOSITION ULNAR NERVE AT ELBOW Right ~ 2012  . PERCUTANEOUS CORONARY ROTOBLATOR INTERVENTION (PCI-R)  11/20/2014   Procedure: PERCUTANEOUS CORONARY ROTOBLATOR INTERVENTION (PCI-R);  Surgeon: [name], MD;  Location: [facility] CATH LAB;  Service: Cardiovascular;;  . SHOULDER ARTHROSCOPY Left ~ 2011  . TEE WITHOUT CARDIOVERSION N/A 01/09/2016   Procedure: TRANSESOPHAGEAL ECHOCARDIOGRAM (TEE);  Surgeon: Rexene Alberts, MD;  Location: [place];  Service: Open Heart Surgery;  Laterality: N/A;    Current Outpatient Medications  Medication Sig Dispense Refill  . albuterol (PROVENTIL HFA;VENTOLIN HFA) 108 (90 BASE) MCG/ACT inhaler Inhale 2 puffs into the lungs every 6 (six) hours as needed for wheezing or shortness of breath.     [name] aspirin EC 81 MG tablet Take 81 mg by mouth at bedtime.     . carvedilol (COREG) 3.125 MG tablet Take 1 tablet (3.125 mg total) by mouth 2 (two) times daily. 60 tablet 11  . cholecalciferol (VITAMIN D3) 25 MCG (1000 UNIT) tablet Take 1,000 Units by mouth daily.    . citalopram (CELEXA) 20 MG tablet Take 20 mg by mouth at bedtime.     . cyclobenzaprine (FLEXERIL) 10 MG tablet Take 10 mg by mouth 3 (three) times daily as needed for muscle spasms.    . ergocalciferol (VITAMIN D2) 1.25 MG (50000 UT) capsule Take 1 capsule (50,000 Units total) by mouth once a week. 20 capsule 1  . ezetimibe (ZETIA) 10 MG tablet Take 10 mg by mouth daily.    . ferrous sulfate 325 (65 FE) MG EC tablet Take 325 mg by mouth once a week.    . fluticasone (FLONASE) 50 MCG/ACT nasal spray Place 1 spray into the nose 2 (two) times daily.     .  Fluticasone-Salmeterol (ADVAIR) 250-50 MCG/DOSE AEPB Inhale 1 puff into the lungs every 12 (twelve) hours.    . furosemide (LASIX) 20 MG tablet Take 20 mg by mouth daily.    [name] gabapentin (NEURONTIN) 300 MG capsule Take 300 mg by mouth 3 (three) times daily.  2  . insulin aspart (NOVOLOG) 100 UNIT/ML FlexPen Inject 0-30 Units into the skin 3 (three) times daily as needed for high blood sugar (CBG >150). Per sliding scale    . isosorbide mononitrate (IMDUR) 30 MG 24 hr tablet Take 30 mg by mouth at bedtime.  4  . Melatonin 5 MG TABS Take 10 mg by mouth at bedtime.    . metFORMIN (GLUCOPHAGE) 1000 MG tablet Take 1 tablet (1,000 mg total) by mouth 2 (two) times daily. For the next 2 days as you did receive IV contrast, then resume on  11/26/2018 (Patient taking differently: Take 1,000 mg by mouth 2 (two) times daily.) 60 tablet 11  . omeprazole (PRILOSEC) 20 MG capsule Take 20 mg by mouth 2 (two) times daily.    . potassium chloride SA (KLOR-CON) 20 MEQ tablet Take 1 tablet (20 mEq total) by mouth 2 (two) times daily. 10 tablet 0  . topiramate (TOPAMAX) 50 MG tablet Take 50 mg by mouth at bedtime.     [name] FLEXTOUCH 100 UNIT/ML SOPN FlexTouch Pen Inject 60-70 Units into the skin in the morning and at bedtime. Inject 60 units in the AM and 70 units in the PM.  5  . vitamin B-12 (CYANOCOBALAMIN) 1000 MCG tablet Take 1,000 mcg by mouth daily.     [name] zinc gluconate 50 MG tablet Take 50 mg by mouth daily.    . nitroGLYCERIN (NITROSTAT) 0.4 MG SL tablet Place 1 tablet (0.4 mg total) under the tongue every 5 (five) minutes x 3 doses as needed for chest pain (if no relief after 3rd dose, proceed to the ED for an evaluation or call 911). 25 tablet 3  . pravastatin (PRAVACHOL) 20 MG tablet Take 1 tablet (20 mg total) by mouth daily. 30 tablet 0   No current facility-administered medications for this visit.   Allergies:  Divalproex sodium, Statins, Tramadol, Valproic acid, Gadolinium derivatives, and Tricor  [fenofibrate]   ROS: No palpitations or syncope.  Physical Exam: VS:  BP (!) 138/58   Pulse 83   Ht 5' 9"  (1.753 m)   Wt 227 lb (103 kg)   SpO2 98%   BMI 33.52 kg/m , BMI Body mass index is 33.52 kg/m.  Wt Readings from Last 3 Encounters:  02/13/21 227 lb (103 kg)  11/29/20 225 lb (102.1 kg)  09/27/20 221 lb (100.2 kg)    General: Patient appears comfortable at rest. HEENT: Conjunctiva and lids normal, wearing a mask. Neck: Supple, no elevated JVP or carotid bruits, no thyromegaly. Lungs: Clear to auscultation, nonlabored breathing at rest. Cardiac: Regular rate and rhythm, no S3 or significant systolic murmur, no pericardial rub. Extremities: No pitting edema.  ECG:  An ECG dated 07/27/2020 was personally reviewed today and demonstrated:  Sinus rhythm with nonspecific ST changes.  Recent Labwork: 02/20/2020: Magnesium 1.8; TSH 2.203 08/15/2020: ALT 44; AST 40; BUN 25; Creat 1.04; Hemoglobin 15.3; Platelets 124; Potassium 4.7; Sodium 141     Component Value Date/Time   CHOL 150 02/20/2020 0452   TRIG 191 (H) 02/20/2020 0452   HDL 26 (L) 02/20/2020 0452   CHOLHDL 5.8 02/20/2020 0452   VLDL 38 02/20/2020 0452   LDLCALC 86 02/20/2020 0452    Other Studies Reviewed Today:  Cardiac catheterization1/14/2020:  Prox LAD to Mid LAD lesion is 90% stenosed.  Mid LAD lesion is 90% stenosed.  Ost LAD lesion is 50% stenosed.  1st Diag lesion is 80% stenosed.  Mid RCA lesion is 40% stenosed.  Dist RCA lesion is 40% stenosed.  Ost RPDA lesion is 100% stenosed.  SVG and is small.  LIMA and is small.  1st Mrg lesion is 99% stenosed.  Prox Cx lesion is 50% stenosed.  RIMA graft was visualized by angiography and is normal in caliber.  Origin to Prox Graft lesion is 40% stenosed.  LV end diastolic pressure is normal.  1. Severe 3 vessel obstructive CAD 2. Patent LIMA to the distal LAD 3. Patent free RIMA to the OM1 4. Patent SVG to PDA 5. Normal  LVEDP  Compared to prior  angiogram dated 08/09/17 there is no significant change. Recommend continued medical therapy.  Assessment and Plan:  1.  Multivessel CAD status post CABG.  Bypass grafts are patent at cardiac catheterization in January 2020.  He reports no progressive angina symptoms.  Continue medical therapy including aspirin, Coreg, Zetia, Imdur, and Pravachol.  2.  Mixed hyperlipidemia, on Pravachol and Zetia.  Keep follow-up with PCP for routine lab work.  3.  Essential hypertension, no longer on Norvasc.  Systolic is in the 606V today.  Medication Adjustments/Labs and Tests Ordered: Current medicines are reviewed at length with the patient today.  Concerns regarding medicines are outlined above.   Tests Ordered: No orders of the defined types were placed in this encounter.   Medication Changes: No orders of the defined types were placed in this encounter.   Disposition:  Follow up 6 months in the [place] office.  Signed, [name], MD, [facility] 02/13/2021 5:02 PM    [facility] [place], [place], [place] [address] Phone: [phone]; Fax: [phone]

## 2021-02-18 ENCOUNTER — Inpatient Hospital Stay (HOSPITAL_COMMUNITY): Payer: Medicare Other | Attending: Hematology

## 2021-02-18 ENCOUNTER — Other Ambulatory Visit: Payer: Self-pay

## 2021-02-18 DIAGNOSIS — D696 Thrombocytopenia, unspecified: Secondary | ICD-10-CM | POA: Insufficient documentation

## 2021-02-18 DIAGNOSIS — E559 Vitamin D deficiency, unspecified: Secondary | ICD-10-CM | POA: Diagnosis not present

## 2021-02-18 DIAGNOSIS — K7460 Unspecified cirrhosis of liver: Secondary | ICD-10-CM | POA: Insufficient documentation

## 2021-02-18 DIAGNOSIS — E611 Iron deficiency: Secondary | ICD-10-CM | POA: Diagnosis not present

## 2021-02-18 DIAGNOSIS — D61818 Other pancytopenia: Secondary | ICD-10-CM

## 2021-02-18 LAB — COMPREHENSIVE METABOLIC PANEL
ALT: 42 U/L (ref 0–44)
AST: 41 U/L (ref 15–41)
Albumin: 3.8 g/dL (ref 3.5–5.0)
Alkaline Phosphatase: 49 U/L (ref 38–126)
Anion gap: 11 (ref 5–15)
BUN: 15 mg/dL (ref 8–23)
CO2: 22 mmol/L (ref 22–32)
Calcium: 9.9 mg/dL (ref 8.9–10.3)
Chloride: 108 mmol/L (ref 98–111)
Creatinine, Ser: 1.03 mg/dL (ref 0.61–1.24)
GFR, Estimated: >60 mL/min (ref >60)
Glucose, Bld: 157 mg/dL — ABNORMAL HIGH (ref 70–99)
Potassium: 4.3 mmol/L (ref 3.5–5.1)
Sodium: 141 mmol/L (ref 135–145)
Total Bilirubin: 0.8 mg/dL (ref 0.3–1.2)
Total Protein: 7.5 g/dL (ref 6.5–8.1)

## 2021-02-18 LAB — IRON AND TIBC
Iron: 101 ug/dL (ref 45–182)
Saturation Ratios: 25 % (ref 17.9–39.5)
TIBC: 402 ug/dL (ref 250–450)
UIBC: 301 ug/dL

## 2021-02-18 LAB — CBC WITH DIFFERENTIAL/PLATELET
Abs Immature Granulocytes: 0.02 10*3/uL (ref 0.00–0.07)
Basophils Absolute: 0 10*3/uL (ref 0.0–0.1)
Basophils Relative: 1 %
Eosinophils Absolute: 0.1 10*3/uL (ref 0.0–0.5)
Eosinophils Relative: 2 %
HCT: 44 % (ref 39.0–52.0)
Hemoglobin: 15.1 g/dL (ref 13.0–17.0)
Immature Granulocytes: 1 %
Lymphocytes Relative: 33 %
Lymphs Abs: 1.1 10*3/uL (ref 0.7–4.0)
MCH: 31.5 pg (ref 26.0–34.0)
MCHC: 34.3 g/dL (ref 30.0–36.0)
MCV: 91.9 fL (ref 80.0–100.0)
Monocytes Absolute: 0.3 10*3/uL (ref 0.1–1.0)
Monocytes Relative: 10 %
Neutro Abs: 1.7 10*3/uL (ref 1.7–7.7)
Neutrophils Relative %: 53 %
Platelets: 96 10*3/uL — ABNORMAL LOW (ref 150–400)
RBC: 4.79 MIL/uL (ref 4.22–5.81)
RDW: 14.6 % (ref 11.5–15.5)
WBC: 3.2 10*3/uL — ABNORMAL LOW (ref 4.0–10.5)
nRBC: 0 % (ref 0.0–0.2)

## 2021-02-18 LAB — FERRITIN: Ferritin: 36 ng/mL (ref 24–336)

## 2021-02-18 LAB — VITAMIN B12: Vitamin B-12: 562 pg/mL (ref 180–914)

## 2021-02-18 LAB — VITAMIN D 25 HYDROXY (VIT D DEFICIENCY, FRACTURES): Vit D, 25-Hydroxy: 30.64 ng/mL (ref 30–100)

## 2021-02-18 LAB — FOLATE: Folate: 13 ng/mL (ref >5.9)

## 2021-02-18 LAB — LACTATE DEHYDROGENASE: LDH: 157 U/L (ref 98–192)

## 2021-02-24 ENCOUNTER — Telehealth: Payer: Self-pay | Admitting: Cardiology

## 2021-02-24 NOTE — Telephone Encounter (Signed)
Left a message to have patient or spouse call our office back.

## 2021-02-24 NOTE — Telephone Encounter (Signed)
Called the office of Dr. Bary Leriche. Spoke with the after hours service Sonia Baller), who stated that she would take the msg and send to the office staff. Telephone and fax numbers given.

## 2021-02-24 NOTE — Telephone Encounter (Signed)
Stanton Kidney - spouse called stating that patient was recently by Dr. Bary Leriche last week. Was told to contact our office asking Dr. Domenic Polite to order a tilt test on patient. [phone] (Dr. Herby Abraham)  Patient would like a call back.

## 2021-02-24 NOTE — Telephone Encounter (Signed)
Spoke with pt's wife who states that the pt was seen by neurology on 02/19/21. At that time patient was passing out and neurology feels that it is coming from a drop in blood pressure. They request that she contact cardiology about doing a Tilt test. Please advise.

## 2021-02-24 NOTE — Telephone Encounter (Signed)
Please get the office note from the neurology visit on April 13.  I can review that and determine if we need to arrange any testing or not.

## 2021-02-25 ENCOUNTER — Inpatient Hospital Stay (HOSPITAL_COMMUNITY): Payer: Medicare Other | Admitting: Physician Assistant

## 2021-02-25 ENCOUNTER — Encounter (HOSPITAL_COMMUNITY): Payer: Self-pay | Admitting: Physician Assistant

## 2021-02-25 ENCOUNTER — Other Ambulatory Visit: Payer: Self-pay

## 2021-02-25 VITALS — BP 123/58 | HR 81 | Temp 97.0°F | Resp 18 | Wt 226.2 lb

## 2021-02-25 DIAGNOSIS — E559 Vitamin D deficiency, unspecified: Secondary | ICD-10-CM | POA: Diagnosis not present

## 2021-02-25 DIAGNOSIS — D696 Thrombocytopenia, unspecified: Secondary | ICD-10-CM
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
## 2021-02-25 NOTE — Patient Instructions (Signed)
[name] [name] at [facility] Discharge Instructions  You were seen today by [name] Abernethy PA-C for your thrombocytopenia (low platelets).  Your platelet levels are stable, but you do have some mild iron deficiency.    LABS: Return in 6 months for labs   OTHER TESTS: None  MEDICATIONS: Start taking DAILY iron supple,emt (325 mg ferrous sulfate).  Continue taking daily Vitamin D supplement.  FOLLOW-UP APPOINTMENT: Office visit in 6 months, or sooner if needed.   Thank you for choosing [name] to provide your oncology and hematology care.  To afford each patient quality time with our provider, please arrive at least 15 minutes before your scheduled appointment time.   If you have a lab appointment with the [facility] please come in thru the Main Entrance and check in at the main information desk.  You need to re-schedule your appointment should you arrive 10 or more minutes late.  We strive to give you quality time with our providers, and arriving late affects you and other patients whose appointments are after yours.  Also, if you no show three or more times for appointments you may be dismissed from the clinic at the providers discretion.     Again, thank you for choosing [facility].  Our hope is that these requests will decrease the amount of time that you wait before being seen by our physicians.       _____________________________________________________________  Should you have questions after your visit to [facility], please contact our office at [phone] and follow the prompts.  Our office hours are 8:00 a.m. and 4:30 p.m. Monday - Friday.  Please note that voicemails left after 4:00 p.m. may not be returned until the following business day.  We are closed weekends and major holidays.  You do have access to a nurse 24-7, just call the main number to the clinic [phone] and do not  press any options, hold on the line and a nurse will answer the phone.    For prescription refill requests, have your pharmacy contact our office and allow 72 hours.    Due to Covid, you will need to wear a mask upon entering the hospital. If you do not have a mask, a mask will be given to you at the Main Entrance upon arrival. For doctor visits, patients may have 1 support person age 52 or older with them. For treatment visits, patients can not have anyone with them due to social distancing guidelines and our immunocompromised population.

## 2021-02-26 NOTE — Telephone Encounter (Signed)
Notes reviewed by provider and he suggest patient be referred to EP to evaluate syncope or have neurologist ([place]) order tilt table test at [facility]. Wife informed and verbalized understanding. Says she will speak to patient about it and contact us back with decision. Notes sent for scanning.

## 2021-02-26 NOTE — Telephone Encounter (Signed)
Office note received and placed in Dr. Myles Gip folder and faxed to [facility] office for review.

## 2021-02-28 ENCOUNTER — Other Ambulatory Visit (HOSPITAL_COMMUNITY): Payer: Self-pay | Admitting: Psychiatry

## 2021-02-28 ENCOUNTER — Other Ambulatory Visit: Payer: Self-pay | Admitting: Psychiatry

## 2021-02-28 DIAGNOSIS — H538 Other visual disturbances: Secondary | ICD-10-CM

## 2021-02-28 DIAGNOSIS — R42 Dizziness and giddiness: Secondary | ICD-10-CM

## 2021-03-10 ENCOUNTER — Ambulatory Visit (HOSPITAL_COMMUNITY): Payer: Medicare Other

## 2021-03-10 ENCOUNTER — Encounter (HOSPITAL_COMMUNITY): Payer: Self-pay

## 2021-03-12 ENCOUNTER — Ambulatory Visit (HOSPITAL_COMMUNITY): Admission: RE | Admit: 2021-03-12 | Payer: Medicare Other | Source: Ambulatory Visit

## 2021-05-16 ENCOUNTER — Telehealth: Payer: Self-pay

## 2021-05-16 NOTE — Telephone Encounter (Signed)
    Given his variety of symptoms, it is difficult to discern from a telephone note what could be the etiology of his symptoms. If he is having intermittent chest pain, would recommend that he utilize SL NTG if needed. If he has to take more than 3, then that would prompt immediate ED evaluation. Given his swelling, would recommend that he track weights to see if there is a change. Would schedule a follow-up visit with Dr. Domenic Polite or an APP for in person evaluation.  Signed, Erma Heritage, PA-C 05/16/2021, 4:46 PM

## 2021-05-16 NOTE — Telephone Encounter (Signed)
[name] [name] from [facility] called to tell us that pt has been having intermittent cp and swelling in his abdomen/ankles for at least a week. Pt has not called [facility] himself about this issue. I called pt after speaking with [name] [name] and pt also admitted that he has been having spells of fatigue, nausea, and a coming and going pain in his arm, that has also lasted the course of the last week. Pt stated that he had nitro SLNG but was trying to hold off on taking it. Encouraged pt that ED would be the best way to get an evaluation especially if symptoms persist or worsen. Please advise.

## 2021-05-19 NOTE — Telephone Encounter (Signed)
Patient notified and verbalized understanding.   OV scheduled for 06/16/2021 with [name], NP in [place] office as nothing earlier available.  Patient informed he will be placed on wait list as well.

## 2021-05-21 NOTE — Progress Notes (Signed)
Cardiology Office Note  Date: 05/22/2021   ID: [name] [name] 1956-05-29, MRN [id number]  PCP:  Dorothyann Peng, FNP  Cardiologist:  Rozann Lesches, MD Electrophysiologist:  None   Chief Complaint: Intermittent chest pain, lower extremity edema  History of Present Illness: [name] [name] is a 65 y.o. male with a history of CAD,, cirrhosis, asthma, HLD, CABG x3, GERD, TIA, thrombocytopenia, DM2, OSA, PVCs.  He was last seen by Dr. Domenic Polite on 02/13/2021 for follow-up visit.  He reported no significant cardiac symptoms i.e. angina or nitroglycerin use.  He has been compliant with therapy and no obvious intolerances.  His bypass grafts were patent at cardiac catheterization January 2020.  He was continuing current medical therapy including aspirin, Coreg, Zetia, Imdur, Pravachol.  He was no longer on Norvasc.  Systolic blood pressures in the 130s.  He was to keep follow-up with PCP for routine lab work.   Telephone encounter May 16, 2021 with complaints of intermittent chest pain and swelling in abdomen/ankles for least a week.  Staff called the patient and he admitted he had been having spells of fatigue, nausea, with on and off pain in his left arm.  This lasted the course of the week.  He had not taken sublingual nitroglycerin.  He was encouraged to go to ED if symptoms persisted or worsened.  He is here today for follow-up secondary to recent phone encounter on July 8 as noted above.  He had noted some lower extremity edema.  He was having issues with fatigue, nausea, and intermittent chest pain.  Also complaining of some lightheadedness/dizziness.  He denies any orthostatic symptoms.  He states that lightheadedness/dizziness can occur at random regardless of position.  He denies any symptoms of palpitations or arrhythmias.  He states he had a carotid study ordered earlier in the year which he was unable to have due to illness.  He has an upcoming visit with neurologist and then will  Dr. Wendi Snipes.  He is pending a vestibular evaluation at [facility] outpatient center on August 4 for lightheadedness/dizziness.  He has a follow-up with hematology oncology for history of fatty liver/cirrhosis.  He states he is compliant with his medication regimen.  States his most recent hemoglobin A1c was 6.9% which was an improvement per his statement over previous.  He states that sometimes with the chest pain he has some left arm pain.  He denies any palpitations or arrhythmias, CVA or TIA-like symptoms, near syncopal or syncopal episodes.  Denies any PND orthopnea.  Denies any bleeding issues.  No claudication-like symptoms, DVT or PE-like symptoms, or lower extremity edema.  Past Medical History:  Diagnosis Date   Anxiety    Arthritis    Asthma    Cataract    Right eye   Chronic lower back pain    Cirrhosis of liver ([place])    Coronary atherosclerosis of native coronary artery    a. LAD & OM stenting;  b. 2007 Cath: nonobs dzs, patent stents;  c. 07/2012 neg MV, EF 61%.  d. 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   LIMA to LAD, free RIMA to OM2, SVG to PDA, open SVG harvest from right thigh   Thrombocytopenia ([place])    TIA (transient ischemic attack) ~ 2010   Type 2 diabetes mellitus ([place])     Past Surgical History:  Procedure Laterality Date   ANTERIOR CERVICAL DECOMP/DISCECTOMY FUSION  1998   "C3-4"   CARDIAC CATHETERIZATION  "several"   CARDIAC CATHETERIZATION N/A 12/30/2015   Procedure: Left Heart Cath and Coronary Angiography;  Surgeon: [name], MD;  Location: [place] [facility];  Service: Cardiovascular;  Laterality: N/A;   CARPAL TUNNEL RELEASE Bilateral 2005    CHOLECYSTECTOMY N/A 11/28/2018   Procedure: LAPAROSCOPIC CHOLECYSTECTOMY;  Surgeon: [name], MD;  Location: [facility];  Service: General;  Laterality: N/A;   COLONOSCOPY     COLONOSCOPY WITH PROPOFOL N/A 09/27/2020   Procedure: COLONOSCOPY WITH PROPOFOL;  Surgeon: [name], MD;  Location: [facility];  Service: Gastroenterology;  Laterality: N/A;   CORONARY ANGIOPLASTY     CORONARY ANGIOPLASTY WITH STENT PLACEMENT  2002; 2003; 11/20/2014   "I have 4 stents after today" (11/20/2014)   CORONARY ARTERY BYPASS GRAFT N/A 01/09/2016   Procedure: CORONARY ARTERY BYPASS GRAFTING (CABG) X 3 UTILIZING RIGHT AND LEFT INTERNAL MAMMARY ARTERY AND ENDOSCOPICALLY HARVESTED SAPHENEOUS VEIN.;  Surgeon: Rexene Alberts, MD;  Location: [place];  Service: Open Heart Surgery;  Laterality: N/A;   ESOPHAGOGASTRODUODENOSCOPY     ESOPHAGOGASTRODUODENOSCOPY (EGD) WITH PROPOFOL N/A 09/27/2020   Procedure: ESOPHAGOGASTRODUODENOSCOPY (EGD) WITH PROPOFOL;  Surgeon: [name], MD;  [facility];  Service: Gastroenterology;  Laterality: N/A;  10   KNEE SURGERY Left 02/2012   "scraped; open"   LEFT HEART CATH AND CORS/GRAFTS ANGIOGRAPHY N/A 08/09/2017   Procedure: LEFT HEART CATH AND CORS/GRAFTS ANGIOGRAPHY;  Surgeon: [name], MD;  Location: [place] [facility];  Service: Cardiovascular;  Laterality: N/A;   LEFT HEART CATH AND CORS/GRAFTS ANGIOGRAPHY N/A 11/22/2018   Procedure: LEFT HEART CATH AND CORS/GRAFTS ANGIOGRAPHY;  Surgeon: [name], [name], MD;  Location: [place] [facility];  Service: Cardiovascular;  Laterality: N/A;   LEFT HEART CATHETERIZATION WITH CORONARY ANGIOGRAM N/A 07/20/2012   Procedure: LEFT HEART CATHETERIZATION WITH CORONARY ANGIOGRAM;  Surgeon: [name], MD;  Location: [place] CATH LAB;  Service: Cardiovascular;  Laterality: N/A;   LEFT HEART CATHETERIZATION WITH CORONARY ANGIOGRAM N/A 11/20/2014   Procedure: LEFT HEART CATHETERIZATION WITH CORONARY  ANGIOGRAM;  Surgeon: [name], MD;  Location: [facility] C[facility] LAB;  Service: Cardiovascular;  Laterality: N/A;   LEFT HEART CATHETERIZATION WITH CORONARY ANGIOGRAM N/A 11/26/2014   Procedure: LEFT HEART CATHETERIZATION WITH CORONARY ANGIOGRAM;  Surgeon: [name], MD;  Location: [facility] CATH LAB;  Service: Cardiovascular;  Laterality: N/A;   NEUROPLASTY / TRANSPOSITION ULNAR NERVE AT ELBOW Right ~ 2012   PERCUTANEOUS CORONARY ROTOBLATOR INTERVENTION (PCI-R)  11/20/2014   Procedure: PERCUTANEOUS CORONARY ROTOBLATOR INTERVENTION (PCI-R);  Surgeon: [name], MD;  Location: [facility] CATH LAB;  Service: Cardiovascular;;   SHOULDER ARTHROSCOPY Left ~ 2011   TEE WITHOUT CARDIOVERSION N/A 01/09/2016   Procedure: TRANSESOPHAGEAL ECHOCARDIOGRAM (TEE);  Surgeon: Rexene Alberts, MD;  Location: [place];  Service: Open Heart Surgery;  Laterality: N/A;    Current Outpatient Medications  Medication Sig Dispense Refill   albuterol (PROVENTIL HFA;VENTOLIN HFA) 108 (90 BASE) MCG/ACT inhaler Inhale 2 puffs into the lungs every 6 (six) hours as needed for wheezing or shortness of breath.  aspirin EC 81 MG tablet Take 81 mg by mouth at bedtime.      carvedilol (COREG) 3.125 MG tablet Take 1 tablet (3.125 mg total) by mouth 2 (two) times daily. 60 tablet 11   cholecalciferol (VITAMIN D3) 25 MCG (1000 UNIT) tablet Take 1,000 Units by mouth daily.     citalopram (CELEXA) 20 MG tablet Take 20 mg by mouth at bedtime.      cyclobenzaprine (FLEXERIL) 10 MG tablet Take 10 mg by mouth 3 (three) times daily as needed for muscle spasms.     ezetimibe (ZETIA) 10 MG tablet Take 10 mg by mouth daily.     ferrous sulfate 325 (65 FE) MG EC tablet Take 325 mg by mouth once a week.     fluticasone (FLONASE) 50 MCG/ACT nasal spray Place 1 spray into the nose 2 (two) times daily.      Fluticasone-Salmeterol (ADVAIR) 250-50 MCG/DOSE AEPB Inhale 1 puff into the lungs every 12 (twelve) hours.     furosemide (LASIX) 20 MG tablet Take 20 mg  by mouth daily.     gabapentin (NEURONTIN) 300 MG capsule Take 300 mg by mouth 3 (three) times daily.  2   insulin aspart (NOVOLOG) 100 UNIT/ML FlexPen Inject 0-30 Units into the skin 3 (three) times daily as needed for high blood sugar (CBG >150). Per sliding scale     isosorbide mononitrate (IMDUR) 60 MG 24 hr tablet Take 1 tablet (60 mg total) by mouth daily. 90 tablet 1   meclizine (ANTIVERT) 25 MG tablet Take 1 tablet by mouth 3 (three) times daily as needed.     Melatonin 5 MG TABS Take 10 mg by mouth at bedtime.     metFORMIN (GLUCOPHAGE) 1000 MG tablet Take 1 tablet (1,000 mg total) by mouth 2 (two) times daily. For the next 2 days as you did receive IV contrast, then resume on 11/26/2018 (Patient taking differently: Take 1,000 mg by mouth 2 (two) times daily.) 60 tablet 11   nitroGLYCERIN (NITROSTAT) 0.4 MG SL tablet Place 0.4 mg under the tongue every 5 (five) minutes x 3 doses as needed for chest pain (if no relief after 2nd dose, proceed to the ED for an evaluation or call 911).     omeprazole (PRILOSEC) 20 MG capsule Take 20 mg by mouth 2 (two) times daily.     topiramate (TOPAMAX) 50 MG tablet Take 50 mg by mouth at bedtime.      TRESIBA FLEXTOUCH 100 UNIT/ML SOPN FlexTouch Pen Inject 60-70 Units into the skin in the morning and at bedtime. Inject 60 units in the AM and 70 units in the PM.  5   vitamin B-12 (CYANOCOBALAMIN) 1000 MCG tablet Take 1,000 mcg by mouth daily.      zinc gluconate 50 MG tablet Take 50 mg by mouth daily.     No current facility-administered medications for this visit.   Allergies:  Divalproex sodium, Statins, Tramadol, Valproic acid, Other, Gadolinium derivatives, and Tricor [fenofibrate]   Social History: The patient  reports that he has never smoked. He has never used smokeless tobacco. He reports that he does not drink alcohol and does not use drugs.   Family History: The patient's family history includes Asthma in his sister and sister; Autism in his son;  Cancer in his paternal grandfather; Coronary artery disease (age of onset: 29) in his father; Heart attack in his paternal grandmother; Heart disease in his paternal uncle; Migraines in his son and son; Multiple sclerosis in  his brother; Seizures in his son; Stroke in his maternal grandmother and mother.   ROS:  Please see the history of present illness. Otherwise, complete review of systems is positive for none.  All other systems are reviewed and negative.   Physical Exam: VS:  BP 124/64   Pulse 72   Ht 5' 9"  (1.753 m)   Wt 230 lb 3.2 oz (104.4 kg)   SpO2 98%   BMI 33.99 kg/m , BMI Body mass index is 33.99 kg/m.  Wt Readings from Last 3 Encounters:  05/22/21 230 lb 3.2 oz (104.4 kg)  02/25/21 226 lb 3.1 oz (102.6 kg)  02/13/21 227 lb (103 kg)    General: Patient appears comfortable at rest. Neck: Supple, no elevated JVP or carotid bruits, no thyromegaly. Lungs: Clear to auscultation, nonlabored breathing at rest. Cardiac: Regular rate and rhythm, no S3 or significant systolic murmur, no pericardial rub. Extremities: No pitting edema, distal pulses 2+. Skin: Warm and dry. Musculoskeletal: No kyphosis. Neuropsychiatric: Alert and oriented x3, affect grossly appropriate.  ECG: May 22, 2021 normal sinus rhythm rate of 72.  Recent Labwork: 02/18/2021: ALT 42; AST 41; BUN 15; Creatinine, Ser 1.03; Hemoglobin 15.1; Platelets 96; Potassium 4.3; Sodium 141     Component Value Date/Time   CHOL 150 02/20/2020 0452   TRIG 191 (H) 02/20/2020 0452   HDL 26 (L) 02/20/2020 0452   CHOLHDL 5.8 02/20/2020 0452   VLDL 38 02/20/2020 0452   LDLCALC 86 02/20/2020 0452    Other Studies Reviewed Today:  Echocardiogram 02/20/2020  1. Left ventricular ejection fraction, by estimation, is 55 to 60%. The left ventricle has normal function. The left ventricle has no regional wall motion abnormalities. There is mild left ventricular hypertrophy. Left ventricular diastolic parameters were normal. 2.  Right ventricular systolic function is normal. The right ventricular size is normal. There is mildly elevated pulmonary artery systolic pressure. The estimated right ventricular systolic pressure is 17.5 mmHg. 3. No right to left interatrial shunting noted with saline contrast. 4. The mitral valve is grossly normal. Trivial mitral valve regurgitation. 5. The aortic valve is tricuspid. Aortic valve regurgitation is mild. Mild aortic valve sclerosis is present, with no evidence of aortic valve stenosis. 6. The inferior vena cava is dilated in size with >50% respiratory variability, suggesting right atrial pressure of 8 mmHg.   Cardiac catheterization 11/22/2018: Prox LAD to Mid LAD lesion is 90% stenosed. Mid LAD lesion is 90% stenosed. Ost LAD lesion is 50% stenosed. 1st Diag lesion is 80% stenosed. Mid RCA lesion is 40% stenosed. Dist RCA lesion is 40% stenosed. Ost RPDA lesion is 100% stenosed. SVG and is small. LIMA and is small. 1st Mrg lesion is 99% stenosed. Prox Cx lesion is 50% stenosed. RIMA graft was visualized by angiography and is normal in caliber. Origin to Prox Graft lesion is 40% stenosed. LV end diastolic pressure is normal.   1. Severe 3 vessel obstructive CAD 2. Patent LIMA to the distal LAD 3. Patent free RIMA to the OM1 4. Patent SVG to PDA 5. Normal LVEDP   Compared to prior angiogram dated 08/09/17 there is no significant change. Recommend continued medical therapy.   Assessment and Plan:  1. Chest pain of uncertain etiology   2. Lower extremity edema   3. CAD in native artery   4. Mixed hyperlipidemia   5. Essential hypertension   6. Lightheadedness    1. Chest pain of uncertain etiology History of CAD with three-vessel bypass.  Having some  intermittent chest pain with occasional radiation to left arm.  Associated with and without exertion.  Increase Imdur to 60 mg daily.  Continue aspirin 81 mg daily.  Continue carvedilol 3.125 mg p.o. twice daily.   Continue sublingual nitroglycerin as needed.  EKG today shows normal sinus rhythm with a rate of 72 no ST or T wave abnormalities noted.  No ectopy noted.  2. Lower extremity edema Patient states he has been having some intermittent lower extremity edema.  He has some very minor edema noted on exam today.  Continue furosemide 20 mg daily.  He has gained approximately 4 pounds since February 25, 2021 on that day he weighed 226 pounds.  Today's weight in the office 230 pounds.  Patient states he weighed at home this morning and weighed 221 pounds.  3. CAD in native artery Complaining of intermittent chest pain with history of CAD status post three-vessel bypass.  We are increasing Imdur to 60 mg daily.  Continue aspirin 81 mg daily.  Continue carvedilol 3.125 mg p.o. twice daily.  Continue sublingual nitroglycerin as needed.  4. Mixed hyperlipidemia Continue Zetia 10 mg daily.  5. Essential hypertension Blood pressure well controlled at 124/64.  Continue carvedilol 3.125 mg p.o. twice daily.  Continue Lasix 20 mg p.o. daily.    6.  Dizziness/lightheadedness Patient states at previous visit her carotid artery duplex study was ordered but he was unable to have the test due to sickness.  Please reorder carotid artery duplex study for complaints of dizziness and lightheadedness.  Patient has an upcoming vestibular evaluation for same complaint.  Medication Adjustments/Labs and Tests Ordered: Current medicines are reviewed at length with the patient today.  Concerns regarding medicines are outlined above.   Disposition: Follow-up with Dr. Domenic Polite or APP 6 to 8 weeks  Signed, Levell July, NP 05/22/2021 3:24 PM    [facility] at [address], [place], [place] [address] Phone: [phone]; Fax: [phone]

## 2021-05-22 ENCOUNTER — Encounter: Payer: Self-pay | Admitting: Family Medicine

## 2021-05-22 ENCOUNTER — Ambulatory Visit: Payer: Medicare Other | Admitting: Family Medicine

## 2021-05-22 VITALS — BP 124/64 | HR 72 | Ht 69.0 in | Wt 230.2 lb

## 2021-05-22 DIAGNOSIS — R079 Chest pain, unspecified: Secondary | ICD-10-CM

## 2021-05-22 DIAGNOSIS — G459 Transient cerebral ischemic attack, unspecified: Secondary | ICD-10-CM

## 2021-05-22 DIAGNOSIS — E782 Mixed hyperlipidemia: Secondary | ICD-10-CM | POA: Diagnosis not present

## 2021-05-22 DIAGNOSIS — I10 Essential (primary) hypertension: Secondary | ICD-10-CM

## 2021-05-22 DIAGNOSIS — R42 Dizziness and giddiness: Secondary | ICD-10-CM

## 2021-05-22 DIAGNOSIS — R600 Localized edema: Secondary | ICD-10-CM

## 2021-05-22 DIAGNOSIS — I2510 Atherosclerotic heart disease of native coronary artery without angina pectoris: Secondary | ICD-10-CM | POA: Diagnosis not present

## 2021-05-22 MED ORDER — ISOSORBIDE MONONITRATE ER 60 MG PO TB24: 60.0000 mg | ORAL_TABLET | Freq: Every day | ORAL | 1 refills | Status: DC

## 2021-05-22 NOTE — Patient Instructions (Addendum)
Medication Instructions:  Your physician has recommended you make the following change in your medication:  Increase isosorbide mononitrate to 60 mg daily Continue other medications the same  Labwork: none  Testing/Procedures: Reschedule carotid doppler  Follow-Up: Your physician recommends that you schedule a follow-up appointment in: 6-8 weeks  Any Other Special Instructions Will Be Listed Below (If Applicable).  If you need a refill on your cardiac medications before your next appointment, please call your pharmacy.

## 2021-05-22 NOTE — Addendum Note (Signed)
Addended by: [name] on: 05/22/2021 03:34 PM   Modules accepted: Orders

## 2021-06-12 ENCOUNTER — Other Ambulatory Visit: Payer: Self-pay

## 2021-06-12 ENCOUNTER — Encounter (HOSPITAL_COMMUNITY): Payer: Self-pay | Admitting: Physical Therapy

## 2021-06-12 ENCOUNTER — Ambulatory Visit (HOSPITAL_COMMUNITY): Payer: Medicare Other | Attending: Otolaryngology | Admitting: Physical Therapy

## 2021-06-12 DIAGNOSIS — R2681 Unsteadiness on feet: Secondary | ICD-10-CM | POA: Insufficient documentation

## 2021-06-12 DIAGNOSIS — R42 Dizziness and giddiness: Secondary | ICD-10-CM | POA: Insufficient documentation

## 2021-06-12 NOTE — Patient Instructions (Signed)
Access Code: [email or web address] URL: [email or web address] Date: 06/12/2021 Prepared by: Josue Hector  Exercises Standing Tandem Balance with Counter Support - 2-3 x daily - 7 x weekly - 1 sets - 3 reps - 20 second hold
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
## 2021-06-16 ENCOUNTER — Ambulatory Visit: Payer: Medicare Other | Admitting: Family Medicine

## 2021-06-26 ENCOUNTER — Ambulatory Visit (HOSPITAL_COMMUNITY): Payer: Medicare Other | Admitting: Physical Therapy

## 2021-07-03 ENCOUNTER — Other Ambulatory Visit: Payer: Self-pay

## 2021-07-03 ENCOUNTER — Encounter (HOSPITAL_COMMUNITY): Payer: Self-pay

## 2021-07-03 ENCOUNTER — Ambulatory Visit (HOSPITAL_COMMUNITY): Payer: Medicare Other

## 2021-07-03 DIAGNOSIS — R2681 Unsteadiness on feet: Secondary | ICD-10-CM

## 2021-07-03 DIAGNOSIS — R42 Dizziness and giddiness: Secondary | ICD-10-CM

## 2021-07-03 NOTE — Therapy (Signed)
[place], [place], [address] Phone: [phone]   Fax:  [phone]  Physical Therapy Treatment  Patient Details  Name: [name] [name] MRN: [id number] Date of Birth: 1956-03-29 Referring Provider (PT): Su Raynelle Bring, MD   Encounter Date: 07/03/2021   PT End of Session - 07/03/21 1646     Visit Number 2    Number of Visits 8    Date for PT Re-Evaluation 07/10/21    Authorization Type UHC Medicare    PT Start Time 1634    PT Stop Time 7782    PT Time Calculation (min) 41 min    Activity Tolerance Patient tolerated treatment well    Behavior During Therapy [facility] for tasks assessed/performed             Past Medical History:  Diagnosis Date   Anxiety    Arthritis    Asthma    Cataract    Right eye   Chronic lower back pain    Cirrhosis of liver ([place])    Coronary atherosclerosis of native coronary artery    a. LAD & OM stenting;  b. 2007 Cath: nonobs dzs, patent stents;  c. 07/2012 neg MV, EF 61%.  d. 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   LIMA to LAD, free RIMA to OM2, SVG to PDA, open SVG harvest from right thigh   Thrombocytopenia (HCC)    TIA (transient ischemic attack) ~ 2010   Type 2 diabetes mellitus ([place])     Past Surgical History:  Procedure Laterality Date   ANTERIOR CERVICAL DECOMP/DISCECTOMY FUSION  1998   "C3-4"   CARDIAC CATHETERIZATION  "several"   CARDIAC CATHETERIZATION N/A 12/30/2015   Procedure: Left Heart Cath and Coronary Angiography;  Surgeon: [name], MD;  Location: [place] CV LAB;  Service: Cardiovascular;  Laterality: N/A;   CARPAL TUNNEL RELEASE Bilateral 2005    CHOLECYSTECTOMY N/A 11/28/2018   Procedure: LAPAROSCOPIC CHOLECYSTECTOMY;  Surgeon: [name], MD;  Location: [facility];  Service: General;  Laterality: N/A;   COLONOSCOPY     COLONOSCOPY WITH PROPOFOL N/A 09/27/2020   Procedure: COLONOSCOPY WITH PROPOFOL;  Surgeon: [name], MD;  [facility];  Service: Gastroenterology;  Laterality: N/A;   CORONARY ANGIOPLASTY     CORONARY ANGIOPLASTY WITH STENT PLACEMENT  2002; 2003; 11/20/2014   "I have 4 stents after today" (11/20/2014)   CORONARY ARTERY BYPASS GRAFT N/A 01/09/2016   Procedure: CORONARY ARTERY BYPASS GRAFTING (CABG) X 3 UTILIZING RIGHT AND LEFT INTERNAL MAMMARY ARTERY AND ENDOSCOPICALLY HARVESTED SAPHENEOUS VEIN.;  Surgeon: Rexene Alberts, MD;  Location: [place];  Service: Open Heart Surgery;  Laterality: N/A;   ESOPHAGOGASTRODUODENOSCOPY     ESOPHAGOGASTRODUODENOSCOPY (EGD) WITH PROPOFOL N/A 09/27/2020   Procedure: ESOPHAGOGASTRODUODENOSCOPY (EGD) WITH PROPOFOL;  Surgeon: [name], MD;  [facility];  Service: Gastroenterology;  Laterality: N/A;  10   KNEE SURGERY Left 02/2012   "scraped; open"   LEFT HEART CATH AND CORS/GRAFTS ANGIOGRAPHY N/A 08/09/2017  Procedure: LEFT HEART CATH AND CORS/GRAFTS ANGIOGRAPHY;  Surgeon: [name], MD;  Location: [place] CV LAB;  Service: Cardiovascular;  Laterality: N/A;   LEFT HEART CATH AND CORS/GRAFTS ANGIOGRAPHY N/A 11/22/2018   Procedure: LEFT HEART CATH AND CORS/GRAFTS ANGIOGRAPHY;  Surgeon: [name], [name], MD;  Location: [place] CV LAB;  Service: Cardiovascular;  Laterality: N/A;   LEFT HEART CATHETERIZATION WITH CORONARY ANGIOGRAM N/A 07/20/2012   Procedure: LEFT HEART CATHETERIZATION WITH CORONARY ANGIOGRAM;  Surgeon: [name], MD;  Location: [place] CATH LAB;  Service: Cardiovascular;  Laterality: N/A;   LEFT HEART CATHETERIZATION WITH CORONARY ANGIOGRAM N/A 11/20/2014   Procedure: LEFT HEART CATHETERIZATION WITH CORONARY  ANGIOGRAM;  Surgeon: [name], MD;  Location: [facility] CATH LAB;  Service: Cardiovascular;  Laterality: N/A;   LEFT HEART CATHETERIZATION WITH CORONARY ANGIOGRAM N/A 11/26/2014   Procedure: LEFT HEART CATHETERIZATION WITH CORONARY ANGIOGRAM;  Surgeon: [name], MD;  Location: [facility] CATH LAB;  Service: Cardiovascular;  Laterality: N/A;   NEUROPLASTY / TRANSPOSITION ULNAR NERVE AT ELBOW Right ~ 2012   PERCUTANEOUS CORONARY ROTOBLATOR INTERVENTION (PCI-R)  11/20/2014   Procedure: PERCUTANEOUS CORONARY ROTOBLATOR INTERVENTION (PCI-R);  Surgeon: [name], MD;  Location: [facility] CATH LAB;  Service: Cardiovascular;;   SHOULDER ARTHROSCOPY Left ~ 2011   TEE WITHOUT CARDIOVERSION N/A 01/09/2016   Procedure: TRANSESOPHAGEAL ECHOCARDIOGRAM (TEE);  Surgeon: Rexene Alberts, MD;  Location: [place];  Service: Open Heart Surgery;  Laterality: N/A;    There were no vitals filed for this visit.   Subjective Assessment - 07/03/21 1639     Subjective Pt reports 1 episode of dizziness as he was walking in, feels the difference between light and shade caused some dizziness.  Stated he feels his eye lids are too big, difficult to keep eyes open due to weakness.    Patient Stated Goals See how to handle dizziness    Currently in Pain? No/denies                               [facility] Adult PT Treatment/Exercise - 07/03/21 0001       Manual Therapy   Manual Therapy Soft tissue mobilization    Manual therapy comments Manual complete separate than rest of tx    Soft tissue mobilization Supine position STM focus on cervical mm for mobility.             Vestibular Treatment/Exercise - 07/03/21 0001       Vestibular Treatment/Exercise   Gaze Exercises X1 Viewing Horizontal;X2 Viewing Horizontal;Eye/Head Exercise Horizontal      X1 Viewing Horizontal   Foot Position NBOS standing      X2 Viewing Horizontal   Foot Position NBOS      Eye/Head Exercise Horizontal   Foot Position standing  NBOS    Comments X1 and X2 standing NBOS                Balance Exercises - 07/03/21 0001       Balance Exercises: Standing   Standing Eyes Opened Narrow base of support (BOS);Head turns    Tandem Stance Eyes open;Intermittent upper extremity support;Foam/compliant surface;3 reps;30 secs   last set on foam   SLS 3 reps   Lt 16, Rt 25" max of 3                PT Short Term Goals - 06/12/21 1618       PT  SHORT TERM GOAL #1   Title Patient will be independent with HEP and self-management strategies to improve functional outcomes    Time 4    Period Weeks    Status New    Target Date 07/10/21      PT SHORT TERM GOAL #2   Title Patient will report at least 75% overall improvement in dizziness to indicate improvement in ability to perform ADLs.    Time 4    Period Weeks    Status New    Target Date 07/10/21      PT SHORT TERM GOAL #3   Title Patient will be able to maintain tandem stance >30 seconds on BLEs to improve stability and reduce risk for falls    Time 4    Period Weeks    Status New    Target Date 07/10/21                      Plan - 07/03/21 1727     Clinical Impression Statement Reviewed goals, educated importance of HEP compliance for maximal benefits, pt stated he has completed current exercise program near counter for safety.  Added gaze based exercises following reports of visual weakness, min A/HHA with NBOS combining balalnce training wiht gaze exercise.  Cueing to improve visual awareness assisted with static balance and ability to progress to dynamic surface.  EOS with manual STM to address cervical mobility.    Personal Factors and Comorbidities Comorbidity 3+    Comorbidities DM, TIA, Cervical fusion, CABG, arthritis    Examination-Activity Limitations Locomotion Level;Transfers;Stairs    Examination-Participation Restrictions Yard Work;Cleaning;Community Activity    Stability/Clinical Decision Making Stable/Uncomplicated     Clinical Decision Making Low    Rehab Potential Fair    PT Frequency 2x / week    PT Duration 4 weeks    PT Treatment/Interventions ADLs/Self Care Home Management;Aquatic Therapy;Biofeedback;Ultrasound;Traction;Functional mobility training;Stair training;Neuromuscular re-education;Patient/family education;Therapeutic activities;Parrafin;Canalith Repostioning;Cryotherapy;Fluidtherapy;Electrical Stimulation;Contrast Bath;Therapeutic exercise;Compression bandaging;Manual lymph drainage;Splinting;Taping;Vasopneumatic Device;Joint Manipulations;Spinal Manipulations;Energy conservation;Dry needling;Manual techniques;Scar mobilization;Balance training;DME Instruction;Gait training;Iontophoresis 56m/ml Dexamethasone;Moist Heat;Vestibular;Visual/perceptual remediation/compensation;Passive range of motion    PT Next Visit Plan F/U and answer any questions wiht gaze based exercises.  Progress static balance and funcitonal standing strength activity. Progress to compliant surfaces and dynamic balance when ready .    PT Home Exercise Plan Eval: tandem stance; 8/25: X1 and X2 gaze based exercises.    Consulted and Agree with Plan of Care Patient             Patient will benefit from skilled therapeutic intervention in order to improve the following deficits and impairments:  Dizziness, Decreased balance, Decreased range of motion, Decreased activity tolerance, Abnormal gait, Difficulty walking  Visit Diagnosis: Dizziness and giddiness  Unsteadiness on feet     Problem List Patient Active Problem List   Diagnosis Date Noted   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) ([place] 02/19/2020   Liver cirrhosis secondary to NASH ([place] 09/13/2019   GERD (gastroesophageal reflux disease) 09/13/2019   Dysphagia 09/13/2019   DDD (degenerative disc disease), cervical 04/13/2019   RUQ abdominal pain 11/28/2018   Calculus of gallbladder with acute cholecystitis without obstruction     Acute diastolic CHF (congestive heart failure) ([place] 11/26/2018   Right upper quadrant abdominal pain 11/26/2018   Acute respiratory failure with hypoxia ([facility] 11/26/2018   Pancytopenia, acquired ([facility]    Acute kidney injury ([facility] 11/20/2018   Aphasia 07/09/2018   Cerebral thrombosis with cerebral infarction 07/09/2018  Chest pain 08/10/2017   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   Coronary artery disease involving native coronary artery with unstable angina pectoris [facility])    CAD -S/P PCI LAD OM/LAD '13 and 2016 12/31/2015   Hyperlipidemia    Chest pain with high risk of acute coronary syndrome 12/23/2015   Precordial chest pain 12/03/2014   TIA (transient ischemic attack)    CAD- diffuse LAD disease at cath 12/30/15    Thrombocytopenia ([place])    Fever 12/04/2013   Accelerating angina ([place]) 12/03/2013   Type 2 diabetes mellitus ([place])    Anxiety    PVC's (premature ventricular contractions) 01/06/2013   Obstructive sleep apnea-declines C-pap 07/04/2012   Mixed hyperlipidemia 07/30/2009   Essential hypertension, benign 07/30/2009   Ihor Austin, LPTA/CLT; CBIS [phone]  Aldona Lento 07/03/2021, 5:35 PM  [place], [place], [address] Phone: [phone]   Fax:  [phone]  Name: [name] [name] MRN: [id number] Date of Birth: 13-Oct-1956

## 2021-07-09 ENCOUNTER — Ambulatory Visit (HOSPITAL_COMMUNITY): Payer: Medicare Other | Admitting: Physical Therapy

## 2021-07-10 ENCOUNTER — Ambulatory Visit (INDEPENDENT_AMBULATORY_CARE_PROVIDER_SITE_OTHER): Payer: Medicare Other

## 2021-07-10 ENCOUNTER — Other Ambulatory Visit: Payer: Self-pay

## 2021-07-10 DIAGNOSIS — G459 Transient cerebral ischemic attack, unspecified: Secondary | ICD-10-CM

## 2021-07-10 DIAGNOSIS — R42 Dizziness and giddiness: Secondary | ICD-10-CM | POA: Diagnosis not present

## 2021-07-15 ENCOUNTER — Telehealth: Payer: Self-pay | Admitting: *Deleted

## 2021-07-15 NOTE — Telephone Encounter (Signed)
-----   Message from Laurine Blazer, LPN sent at 12/16/6392  1:48 PM EDT -----  ----- Message ----- From: Verta Ellen., NP Sent: 07/11/2021   1:15 PM EDT To: Laurine Blazer, LPN  Please call the patient and let him know the carotid studies showed only mild plaque/narrowing in his carotid arteries on both sides.  Just let him know we will keep an eye on this by doing more studies in the future.  Thanks  Verta Ellen, NP  07/11/2021 1:15 PM

## 2021-07-15 NOTE — Telephone Encounter (Signed)
Patient informed. Copy sent to PCP °

## 2021-07-17 ENCOUNTER — Encounter (HOSPITAL_COMMUNITY): Payer: Self-pay

## 2021-07-17 ENCOUNTER — Other Ambulatory Visit: Payer: Self-pay

## 2021-07-17 ENCOUNTER — Ambulatory Visit (HOSPITAL_COMMUNITY): Payer: Medicare Other | Attending: Otolaryngology

## 2021-07-17 DIAGNOSIS — R42 Dizziness and giddiness: Secondary | ICD-10-CM | POA: Insufficient documentation

## 2021-07-17 DIAGNOSIS — R2689 Other abnormalities of gait and mobility: Secondary | ICD-10-CM | POA: Diagnosis present

## 2021-07-17 DIAGNOSIS — R2681 Unsteadiness on feet: Secondary | ICD-10-CM | POA: Diagnosis present

## 2021-07-17 NOTE — Progress Notes (Deleted)
Cardiology Office Note  Date: 07/17/2021   ID: Sora, Olivo 05-May-1956, MRN [id number]  PCP:  Dorothyann Peng, FNP  Cardiologist:  Rozann Lesches, MD Electrophysiologist:  None   Chief Complaint: Intermittent chest pain, lower extremity edema  History of Present Illness: [name] [name] AGE is a 65 y.o. male with a history of CAD,, cirrhosis, asthma, HLD, CABG x3, GERD, TIA, thrombocytopenia, DM2, OSA, PVCs.  He was last seen by Dr. Domenic Polite on 02/13/2021 for follow-up visit.  He reported no significant cardiac symptoms i.e. angina or nitroglycerin use.  He has been compliant with therapy and no obvious intolerances.  His bypass grafts were patent at cardiac catheterization January 2020.  He was continuing current medical therapy including aspirin, Coreg, Zetia, Imdur, Pravachol.  He was no longer on Norvasc.  Systolic blood pressures in the 130s.  He was to keep follow-up with PCP for routine lab work.   Telephone encounter May 16, 2021 with complaints of intermittent chest pain and swelling in abdomen/ankles for least a week.  Staff called the patient and he admitted he had been having spells of fatigue, nausea, with on and off pain in his left arm.  This lasted the course of the week.  He had not taken sublingual nitroglycerin.  He was encouraged to go to ED if symptoms persisted or worsened.  He is here today for follow-up secondary to recent phone encounter on July 8 as noted above.  He had noted some lower extremity edema.  He was having issues with fatigue, nausea, and intermittent chest pain.  Also complaining of some lightheadedness/dizziness.  He denies any orthostatic symptoms.  He states that lightheadedness/dizziness can occur at random regardless of position.  He denies any symptoms of palpitations or arrhythmias.  He states he had a carotid study ordered earlier in the year which he was unable to have due to illness.  He has an upcoming visit with neurologist and then will Dr.  Wendi Snipes.  He is pending a vestibular evaluation at [facility] outpatient center on August 4 for lightheadedness/dizziness.  He has a follow-up with hematology oncology for history of fatty liver/cirrhosis.  He states he is compliant with his medication regimen.  States his most recent hemoglobin A1c was 6.9% which was an improvement per his statement over previous.  He states that sometimes with the chest pain he has some left arm pain.  He denies any palpitations or arrhythmias, CVA or TIA-like symptoms, near syncopal or syncopal episodes.  Denies any PND orthopnea.  Denies any bleeding issues.  No claudication-like symptoms, DVT or PE-like symptoms, or lower extremity edema.  Past Medical History:  Diagnosis Date   Anxiety    Arthritis    Asthma    Cataract    Right eye   Chronic lower back pain    Cirrhosis of liver ([place])    Coronary atherosclerosis of native coronary artery    a. LAD & OM stenting;  b. 2007 Cath: nonobs dzs, patent stents;  c. 07/2012 neg MV, EF 61%.  d. 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   LIMA to LAD, free RIMA to OM2, SVG to PDA, open SVG harvest from right thigh   Thrombocytopenia ([place])    TIA (transient ischemic attack) ~ 2010   Type 2 diabetes mellitus ([place])     Past Surgical History:  Procedure Laterality Date   ANTERIOR CERVICAL DECOMP/DISCECTOMY FUSION  1998   "C3-4"   CARDIAC CATHETERIZATION  "several"   CARDIAC CATHETERIZATION N/A 12/30/2015   Procedure: Left Heart Cath and Coronary Angiography;  Surgeon: [name], MD;  Location: [place] [facility];  Service: Cardiovascular;  Laterality: N/A;   CARPAL TUNNEL RELEASE Bilateral 2005   CHOLECYSTECTOMY  N/A 11/28/2018   Procedure: LAPAROSCOPIC CHOLECYSTECTOMY;  Surgeon: [name], MD;  Location: [facility];  Service: General;  Laterality: N/A;   COLONOSCOPY     COLONOSCOPY WITH PROPOFOL N/A 09/27/2020   Procedure: COLONOSCOPY WITH PROPOFOL;  Surgeon: [name], MD;  Location: [facility];  Service: Gastroenterology;  Laterality: N/A;   CORONARY ANGIOPLASTY     CORONARY ANGIOPLASTY WITH STENT PLACEMENT  2002; 2003; 11/20/2014   "I have 4 stents after today" (11/20/2014)   CORONARY ARTERY BYPASS GRAFT N/A 01/09/2016   Procedure: CORONARY ARTERY BYPASS GRAFTING (CABG) X 3 UTILIZING RIGHT AND LEFT INTERNAL MAMMARY ARTERY AND ENDOSCOPICALLY HARVESTED SAPHENEOUS VEIN.;  Surgeon: Rexene Alberts, MD;  Location: [place];  Service: Open Heart Surgery;  Laterality: N/A;   ESOPHAGOGASTRODUODENOSCOPY     ESOPHAGOGASTRODUODENOSCOPY (EGD) WITH PROPOFOL N/A 09/27/2020   Procedure: ESOPHAGOGASTRODUODENOSCOPY (EGD) WITH PROPOFOL;  Surgeon: [name], MD;  [facility];  Service: Gastroenterology;  Laterality: N/A;  10   KNEE SURGERY Left 02/2012   "scraped; open"   LEFT HEART CATH AND CORS/GRAFTS ANGIOGRAPHY N/A 08/09/2017   Procedure: LEFT HEART CATH AND CORS/GRAFTS ANGIOGRAPHY;  Surgeon: [name], MD;  Location: [place] [facility];  Service: Cardiovascular;  Laterality: N/A;   LEFT HEART CATH AND CORS/GRAFTS ANGIOGRAPHY N/A 11/22/2018   Procedure: LEFT HEART CATH AND CORS/GRAFTS ANGIOGRAPHY;  Surgeon: [name], [name], MD;  Location: [place] [facility];  Service: Cardiovascular;  Laterality: N/A;   LEFT HEART CATHETERIZATION WITH CORONARY ANGIOGRAM N/A 07/20/2012   Procedure: LEFT HEART CATHETERIZATION WITH CORONARY ANGIOGRAM;  Surgeon: [name], MD;  Location: [place] [facility];  Service: Cardiovascular;  Laterality: N/A;   LEFT HEART CATHETERIZATION WITH CORONARY ANGIOGRAM N/A 11/20/2014   Procedure: LEFT HEART CATHETERIZATION WITH CORONARY ANGIOGRAM;   Surgeon: [name], MD;  Location: [facility] C[facility];  Service: Cardiovascular;  Laterality: N/A;   LEFT HEART CATHETERIZATION WITH CORONARY ANGIOGRAM N/A 11/26/2014   Procedure: LEFT HEART CATHETERIZATION WITH CORONARY ANGIOGRAM;  Surgeon: [name], MD;  Location: [facility] CATH LAB;  Service: Cardiovascular;  Laterality: N/A;   NEUROPLASTY / TRANSPOSITION ULNAR NERVE AT ELBOW Right ~ 2012   PERCUTANEOUS CORONARY ROTOBLATOR INTERVENTION (PCI-R)  11/20/2014   Procedure: PERCUTANEOUS CORONARY ROTOBLATOR INTERVENTION (PCI-R);  Surgeon: [name], MD;  Location: [facility] CATH LAB;  Service: Cardiovascular;;   SHOULDER ARTHROSCOPY Left ~ 2011   TEE WITHOUT CARDIOVERSION N/A 01/09/2016   Procedure: TRANSESOPHAGEAL ECHOCARDIOGRAM (TEE);  Surgeon: Rexene Alberts, MD;  Location: [place];  Service: Open Heart Surgery;  Laterality: N/A;    Current Outpatient Medications  Medication Sig Dispense Refill   albuterol (PROVENTIL HFA;VENTOLIN HFA) 108 (90 BASE) MCG/ACT inhaler Inhale 2 puffs into the lungs every 6 (six) hours as needed for wheezing or shortness of breath.  aspirin EC 81 MG tablet Take 81 mg by mouth at bedtime.      carvedilol (COREG) 3.125 MG tablet Take 1 tablet (3.125 mg total) by mouth 2 (two) times daily. 60 tablet 11   cholecalciferol (VITAMIN D3) 25 MCG (1000 UNIT) tablet Take 1,000 Units by mouth daily.     citalopram (CELEXA) 20 MG tablet Take 20 mg by mouth at bedtime.      cyclobenzaprine (FLEXERIL) 10 MG tablet Take 10 mg by mouth 3 (three) times daily as needed for muscle spasms.     ezetimibe (ZETIA) 10 MG tablet Take 10 mg by mouth daily.     ferrous sulfate 325 (65 FE) MG EC tablet Take 325 mg by mouth once a week.     fluticasone (FLONASE) 50 MCG/ACT nasal spray Place 1 spray into the nose 2 (two) times daily.      Fluticasone-Salmeterol (ADVAIR) 250-50 MCG/DOSE AEPB Inhale 1 puff into the lungs every 12 (twelve) hours.     furosemide (LASIX) 20 MG tablet Take 20 mg by mouth  daily.     gabapentin (NEURONTIN) 300 MG capsule Take 300 mg by mouth 3 (three) times daily.  2   insulin aspart (NOVOLOG) 100 UNIT/ML FlexPen Inject 0-30 Units into the skin 3 (three) times daily as needed for high blood sugar (CBG >150). Per sliding scale     isosorbide mononitrate (IMDUR) 60 MG 24 hr tablet Take 1 tablet (60 mg total) by mouth daily. 90 tablet 1   meclizine (ANTIVERT) 25 MG tablet Take 1 tablet by mouth 3 (three) times daily as needed.     Melatonin 5 MG TABS Take 10 mg by mouth at bedtime.     metFORMIN (GLUCOPHAGE) 1000 MG tablet Take 1 tablet (1,000 mg total) by mouth 2 (two) times daily. For the next 2 days as you did receive IV contrast, then resume on 11/26/2018 (Patient taking differently: Take 1,000 mg by mouth 2 (two) times daily.) 60 tablet 11   nitroGLYCERIN (NITROSTAT) 0.4 MG SL tablet Place 0.4 mg under the tongue every 5 (five) minutes x 3 doses as needed for chest pain (if no relief after 2nd dose, proceed to the ED for an evaluation or call 911).     omeprazole (PRILOSEC) 20 MG capsule Take 20 mg by mouth 2 (two) times daily.     topiramate (TOPAMAX) 50 MG tablet Take 50 mg by mouth at bedtime.      TRESIBA FLEXTOUCH 100 UNIT/ML SOPN FlexTouch Pen Inject 60-70 Units into the skin in the morning and at bedtime. Inject 60 units in the AM and 70 units in the PM.  5   vitamin B-12 (CYANOCOBALAMIN) 1000 MCG tablet Take 1,000 mcg by mouth daily.      zinc gluconate 50 MG tablet Take 50 mg by mouth daily.     No current facility-administered medications for this visit.   Allergies:  Divalproex sodium, Statins, Tramadol, Valproic acid, Other, Gadolinium derivatives, and Tricor [fenofibrate]   Social History: The patient  reports that he has never smoked. He has never used smokeless tobacco. He reports that he does not drink alcohol and does not use drugs.   Family History: The patient's family history includes Asthma in his sister and sister; Autism in his son; Cancer  in his paternal grandfather; Coronary artery disease (age of onset: 36) in his father; Heart attack in his paternal grandmother; Heart disease in his paternal uncle; Migraines in his son and son; Multiple sclerosis in  his brother; Seizures in his son; Stroke in his maternal grandmother and mother.   ROS:  Please see the history of present illness. Otherwise, complete review of systems is positive for none.  All other systems are reviewed and negative.   Physical Exam: VS:  There were no vitals taken for this visit., BMI There is no height or weight on file to calculate BMI.  Wt Readings from Last 3 Encounters:  05/22/21 230 lb 3.2 oz (104.4 kg)  02/25/21 226 lb 3.1 oz (102.6 kg)  02/13/21 227 lb (103 kg)    General: Patient appears comfortable at rest. Neck: Supple, no elevated JVP or carotid bruits, no thyromegaly. Lungs: Clear to auscultation, nonlabored breathing at rest. Cardiac: Regular rate and rhythm, no S3 or significant systolic murmur, no pericardial rub. Extremities: No pitting edema, distal pulses 2+. Skin: Warm and dry. Musculoskeletal: No kyphosis. Neuropsychiatric: Alert and oriented x3, affect grossly appropriate.  ECG: May 22, 2021 normal sinus rhythm rate of 72.  Recent Labwork: 02/18/2021: ALT 42; AST 41; BUN 15; Creatinine, Ser 1.03; Hemoglobin 15.1; Platelets 96; Potassium 4.3; Sodium 141     Component Value Date/Time   CHOL 150 02/20/2020 0452   TRIG 191 (H) 02/20/2020 0452   HDL 26 (L) 02/20/2020 0452   CHOLHDL 5.8 02/20/2020 0452   VLDL 38 02/20/2020 0452   LDLCALC 86 02/20/2020 0452    Other Studies Reviewed Today:  Carotid artery duplex 07/10/2021 Summary: Right Carotid: Velocities in the right ICA are consistent with a 1-39% stenosis. Non-hemodynamically significant plaque <50% noted in the CCA. Left Carotid: Velocities in the left ICA are consistent with a 1-39% stenosis. The ECA appears >50% stenosed.    Echocardiogram 02/20/2020  1. Left  ventricular ejection fraction, by estimation, is 55 to 60%. The left ventricle has normal function. The left ventricle has no regional wall motion abnormalities. There is mild left ventricular hypertrophy. Left ventricular diastolic parameters were normal. 2. Right ventricular systolic function is normal. The right ventricular size is normal. There is mildly elevated pulmonary artery systolic pressure. The estimated right ventricular systolic pressure is 62.6 mmHg. 3. No right to left interatrial shunting noted with saline contrast. 4. The mitral valve is grossly normal. Trivial mitral valve regurgitation. 5. The aortic valve is tricuspid. Aortic valve regurgitation is mild. Mild aortic valve sclerosis is present, with no evidence of aortic valve stenosis. 6. The inferior vena cava is dilated in size with >50% respiratory variability, suggesting right atrial pressure of 8 mmHg.   Cardiac catheterization 11/22/2018: Prox LAD to Mid LAD lesion is 90% stenosed. Mid LAD lesion is 90% stenosed. Ost LAD lesion is 50% stenosed. 1st Diag lesion is 80% stenosed. Mid RCA lesion is 40% stenosed. Dist RCA lesion is 40% stenosed. Ost RPDA lesion is 100% stenosed. SVG and is small. LIMA and is small. 1st Mrg lesion is 99% stenosed. Prox Cx lesion is 50% stenosed. RIMA graft was visualized by angiography and is normal in caliber. Origin to Prox Graft lesion is 40% stenosed. LV end diastolic pressure is normal.   1. Severe 3 vessel obstructive CAD 2. Patent LIMA to the distal LAD 3. Patent free RIMA to the OM1 4. Patent SVG to PDA 5. Normal LVEDP   Compared to prior angiogram dated 08/09/17 there is no significant change. Recommend continued medical therapy.   Assessment and Plan:  1. Chest pain of uncertain etiology   2. Lower extremity edema   3. CAD in native artery   4. Mixed  hyperlipidemia   5. Essential hypertension   6. Dizziness     1. Chest pain of uncertain etiology History  of CAD with three-vessel bypass.  Having some intermittent chest pain with occasional radiation to left arm.  Associated with and without exertion.  Increase Imdur to 60 mg daily.  Continue aspirin 81 mg daily.  Continue carvedilol 3.125 mg p.o. twice daily.  Continue sublingual nitroglycerin as needed.  EKG today shows normal sinus rhythm with a rate of 72 no ST or T wave abnormalities noted.  No ectopy noted.  2. Lower extremity edema Patient states he has been having some intermittent lower extremity edema.  He has some very minor edema noted on exam today.  Continue furosemide 20 mg daily.  He has gained approximately 4 pounds since February 25, 2021 on that day he weighed 226 pounds.  Today's weight in the office 230 pounds.  Patient states he weighed at home this morning and weighed 221 pounds.  Any information  3. CAD in native artery Complaining of intermittent chest pain with history of CAD status post three-vessel bypass.  We are increasing Imdur to 60 mg daily.  Continue aspirin 81 mg daily.  Continue carvedilol 3.125 mg p.o. twice daily.  Continue sublingual nitroglycerin as needed.  4. Mixed hyperlipidemia Continue Zetia 10 mg daily.  5. Essential hypertension Blood pressure well controlled at 124/64.  Continue carvedilol 3.125 mg p.o. twice daily.  Continue Lasix 20 mg p.o. daily.    6.  Dizziness/lightheadedness Patient states at previous visit her carotid artery duplex study was ordered but he was unable to have the test due to sickness.  Please reorder carotid artery duplex study for complaints of dizziness and lightheadedness.  Patient has an upcoming vestibular evaluation for same complaint.  Medication Adjustments/Labs and Tests Ordered: Current medicines are reviewed at length with the patient today.  Concerns regarding medicines are outlined above.   Disposition: Follow-up with Dr. Domenic Polite or APP 6 to 8 weeks  Signed, Levell July, NP 07/17/2021 10:43 PM    [facility], [place], [place] [address] Phone: [phone]; Fax: [phone] Disease most sensitive documents it is related to her lisinopril to there is a very good chance most important secrets could compromise as result of that I think this is a serious matter and details see

## 2021-07-17 NOTE — Therapy (Addendum)
[facility] [place], [place], [address] Phone: [phone]   Fax:  [phone]  Physical Therapy Treatment  Patient Details  Name: [name] [name] MRN: [id number] Date of Birth: 1955/11/22 Referring Provider (PT): Su Raynelle Bring, MD  [facility] [address] [place], [place], [address] Phone: [phone]   Fax:  [phone]  Physical Therapy Treatment  Patient Details  Name: [name] [name] MRN: [id number] Date of Birth: 1956-04-24 Referring Provider (PT): Su Raynelle Bring, MD   Encounter Date: 07/17/2021   PT End of Session - 07/17/21 1538     Visit Number 3    Number of Visits 8    Date for PT Re-Evaluation 07/10/21    Authorization Type [employer] Medicare    PT Start Time 1534    PT Stop Time 1612    PT Time Calculation (min) 38 min    Activity Tolerance Patient tolerated treatment well    Behavior During Therapy [facility] for tasks assessed/performed             Past Medical History:  Diagnosis Date   Anxiety    Arthritis    Asthma    Cataract    Right eye   Chronic lower back pain    Cirrhosis of liver ([place])    Coronary atherosclerosis of native coronary artery    a. LAD & OM stenting;  b. 2007 Cath: nonobs dzs, patent stents;  c. 07/2012 neg MV, EF 61%.  d. 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   LIMA to LAD, free RIMA to OM2, SVG to PDA, open SVG harvest from right thigh   Thrombocytopenia (HCC)    TIA (transient ischemic attack) ~ 2010   Type 2 diabetes mellitus ([place])     Past Surgical History:  Procedure Laterality Date   ANTERIOR CERVICAL DECOMP/DISCECTOMY FUSION  1998    "C3-4"   CARDIAC CATHETERIZATION  "several"   CARDIAC CATHETERIZATION N/A 12/30/2015   Procedure: Left Heart Cath and Coronary Angiography;  Surgeon: [name], MD;  Location: [place];  Service: Cardiovascular;  Laterality: N/A;   CARPAL TUNNEL RELEASE Bilateral 2005   CHOLECYSTECTOMY N/A 11/28/2018   Procedure: LAPAROSCOPIC CHOLECYSTECTOMY;  Surgeon: [name], MD;  Location: [facility];  Service: General;  Laterality: N/A;   COLONOSCOPY     COLONOSCOPY WITH PROPOFOL N/A 09/27/2020   Procedure: COLONOSCOPY WITH PROPOFOL;  Surgeon: [name], MD;  Location: [facility];  Service: Gastroenterology;  Laterality: N/A;   CORONARY ANGIOPLASTY     CORONARY ANGIOPLASTY WITH STENT PLACEMENT  2002; 2003; 11/20/2014   "I have 4 stents after today" (11/20/2014)   CORONARY ARTERY BYPASS GRAFT N/A 01/09/2016   Procedure: CORONARY ARTERY BYPASS GRAFTING (CABG) X 3 UTILIZING RIGHT AND LEFT INTERNAL MAMMARY ARTERY AND ENDOSCOPICALLY HARVESTED SAPHENEOUS VEIN.;  Surgeon: Rexene Alberts, MD;  Location: [facility];  Service: Open Heart Surgery;  Laterality: N/A;   ESOPHAGOGASTRODUODENOSCOPY     ESOPHAGOGASTRODUODENOSCOPY (EGD) WITH PROPOFOL N/A 09/27/2020  Procedure: ESOPHAGOGASTRODUODENOSCOPY (EGD) WITH PROPOFOL;  Surgeon: [name], MD;  [facility];  Service: Gastroenterology;  Laterality: N/A;  10   KNEE SURGERY Left 02/2012   "scraped; open"   LEFT HEART CATH AND CORS/GRAFTS ANGIOGRAPHY N/A 08/09/2017   Procedure: LEFT HEART CATH AND CORS/GRAFTS ANGIOGRAPHY;  Surgeon: [name], MD;  Location: [facility];  Service: Cardiovascular;  Laterality: N/A;   LEFT HEART CATH AND CORS/GRAFTS ANGIOGRAPHY N/A 11/22/2018   Procedure: LEFT HEART CATH AND CORS/GRAFTS ANGIOGRAPHY;  Surgeon: [name], [name], MD;  Location: [facility];  Service: Cardiovascular;  Laterality: N/A;   LEFT HEART CATHETERIZATION WITH CORONARY ANGIOGRAM N/A 07/20/2012    Procedure: LEFT HEART CATHETERIZATION WITH CORONARY ANGIOGRAM;  Surgeon: [name], MD;  Location: [place] [place] [facility];  Service: Cardiovascular;  Laterality: N/A;   LEFT HEART CATHETERIZATION WITH CORONARY ANGIOGRAM N/A 11/20/2014   Procedure: LEFT HEART CATHETERIZATION WITH CORONARY ANGIOGRAM;  Surgeon: [name], MD;  Location: [facility] CATH LAB;  Service: Cardiovascular;  Laterality: N/A;   LEFT HEART CATHETERIZATION WITH CORONARY ANGIOGRAM N/A 11/26/2014   Procedure: LEFT HEART CATHETERIZATION WITH CORONARY ANGIOGRAM;  Surgeon: [name], MD;  Location: [facility] CATH LAB;  Service: Cardiovascular;  Laterality: N/A;   NEUROPLASTY / TRANSPOSITION ULNAR NERVE AT ELBOW Right ~ 2012   PERCUTANEOUS CORONARY ROTOBLATOR INTERVENTION (PCI-R)  11/20/2014   Procedure: PERCUTANEOUS CORONARY ROTOBLATOR INTERVENTION (PCI-R);  Surgeon: [name], MD;  Location: [facility] CATH LAB;  Service: Cardiovascular;;   SHOULDER ARTHROSCOPY Left ~ 2011   TEE WITHOUT CARDIOVERSION N/A 01/09/2016   Procedure: TRANSESOPHAGEAL ECHOCARDIOGRAM (TEE);  Surgeon: Rexene Alberts, MD;  Location: [facility];  Service: Open Heart Surgery;  Laterality: N/A;    There were no vitals filed for this visit.   Subjective Assessment - 07/17/21 1537     Subjective Pt stated the gaze exercises made him cross his eyes.  Reports he continues to have dizziness 1-2 times a day.    Patient Stated Goals See how to handle dizziness    Currently in Pain? No/denies                               [facility] Adult PT Treatment/Exercise - 07/17/21 0001       Exercises   Exercises Neck      Neck Exercises: Seated   Other Seated Exercise 3D cervical excursion 10x      Neck Exercises: Stretches   Upper Trapezius Stretch Right;Left;2 reps;30 seconds             Vestibular Treatment/Exercise - 07/17/21 0001       Vestibular Treatment/Exercise   Gaze Exercises X2 Viewing Horizontal;Eye/Head Exercise Horizontal      X2 Viewing  Horizontal   Foot Position tandem stance wiht 1 foot onto 6in step      Eye/Head Exercise Horizontal   Foot Position tandem stance wiht 1 foot onto 6in step    Comments X2                Balance Exercises - 07/17/21 0001       Balance Exercises: Standing   Tandem Stance Eyes open;Foam/compliant surface;3 reps;30 secs   last set with head turns x 10   SLS 3 reps   Rt 18; Lt 15"   SLS with Vectors Solid surface;Upper extremity assist 1;5 reps   5x 5" BLE   Sidestepping 2 reps;Theraband  RTB                        PT Short Term Goals - 07/23/21 0823       PT SHORT TERM GOAL #1   Title Patient will be independent with HEP and self-management strategies to improve functional outcomes    Baseline 07/17/21:  Reports compliance with gazing, balance and cervical mobility exercises.    Status Achieved      PT SHORT TERM GOAL #2   Title Patient will report at least 75% overall improvement in dizziness to indicate improvement in ability to perform ADLs.    Baseline 07/17/21:  Reports decreased frequency in dizziness, no % given.    Status On-going      PT SHORT TERM GOAL #3   Title Patient will be able to maintain tandem stance >30 seconds on BLEs to improve stability and reduce risk for falls    Baseline 07/17/21:  Able to maintain tandem stance on solid surface, required HHA on dynamic    Status On-going                        Patient will benefit from skilled therapeutic intervention in order to improve the following deficits and impairments:  Dizziness, Decreased balance, Decreased range of motion, Decreased activity tolerance, Abnormal gait, Difficulty walking  Visit Diagnosis: Dizziness and giddiness  Unsteadiness on feet     Problem List Patient Active Problem List   Diagnosis Date Noted   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) ([place]) 02/19/2020   Liver cirrhosis secondary to NASH ([place]) 09/13/2019   GERD  (gastroesophageal reflux disease) 09/13/2019   Dysphagia 09/13/2019   DDD (degenerative disc disease), cervical 04/13/2019   RUQ abdominal pain 11/28/2018   Calculus of gallbladder with acute cholecystitis without obstruction    Acute diastolic CHF (congestive heart failure) ([place]) 11/26/2018   Right upper quadrant abdominal pain 11/26/2018   Acute respiratory failure with hypoxia ([place]) 11/26/2018   Pancytopenia, acquired ([place])    Acute kidney injury ([place]) 11/20/2018   Aphasia 07/09/2018   Cerebral thrombosis with cerebral infarction 07/09/2018   Chest pain 08/10/2017   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)    Anxiety    PVC's (premature ventricular contractions) 01/06/2013   Obstructive sleep apnea-declines C-pap 07/04/2012   Mixed hyperlipidemia 07/30/2009   Essential hypertension, benign 07/30/2009   Ihor Austin, LPTA/CLT; CBIS [phone]  Aldona Lento, PTA 07/23/2021, 8:25 AM  [facility] [address] [place], [place], [address] Phone: [phone]   Fax:  [phone]  Name: [name] [name] MRN: [id number] Date of Birth: Jul 13, 1956

## 2021-07-18 ENCOUNTER — Ambulatory Visit: Payer: Medicare Other | Admitting: Family Medicine

## 2021-07-18 DIAGNOSIS — I10 Essential (primary) hypertension: Secondary | ICD-10-CM

## 2021-07-18 DIAGNOSIS — R42 Dizziness and giddiness: Secondary | ICD-10-CM

## 2021-07-18 DIAGNOSIS — R600 Localized edema: Secondary | ICD-10-CM

## 2021-07-18 DIAGNOSIS — R079 Chest pain, unspecified: Secondary | ICD-10-CM

## 2021-07-18 DIAGNOSIS — I2510 Atherosclerotic heart disease of native coronary artery without angina pectoris: Secondary | ICD-10-CM

## 2021-07-18 DIAGNOSIS — E782 Mixed hyperlipidemia: Secondary | ICD-10-CM

## 2021-07-23 ENCOUNTER — Ambulatory Visit (HOSPITAL_COMMUNITY): Payer: Medicare Other | Admitting: Physical Therapy

## 2021-07-25 ENCOUNTER — Ambulatory Visit (HOSPITAL_COMMUNITY): Payer: Medicare Other | Admitting: Physical Therapy

## 2021-07-25 ENCOUNTER — Telehealth (HOSPITAL_COMMUNITY): Payer: Self-pay | Admitting: Physical Therapy

## 2021-07-25 NOTE — Telephone Encounter (Signed)
NO Show #1:\ Called pt left message that he did not have another appointment and to please call the department to get scheduled.  Rayetta Humphrey, [place] CLT [phone]

## 2021-07-27 ENCOUNTER — Emergency Department (HOSPITAL_COMMUNITY): Payer: Medicare Other

## 2021-07-27 ENCOUNTER — Other Ambulatory Visit: Payer: Self-pay

## 2021-07-27 ENCOUNTER — Observation Stay (HOSPITAL_COMMUNITY)
Admission: EM | Admit: 2021-07-27 | Discharge: 2021-07-28 | Disposition: A | Discharge status: Home / Self Care | Payer: Medicare Other | Attending: Internal Medicine | Admitting: Internal Medicine

## 2021-07-27 DIAGNOSIS — K7460 Unspecified cirrhosis of liver: Secondary | ICD-10-CM | POA: Diagnosis present

## 2021-07-27 DIAGNOSIS — I6330 Cerebral infarction due to thrombosis of unspecified cerebral artery: Secondary | ICD-10-CM | POA: Diagnosis not present

## 2021-07-27 DIAGNOSIS — R531 Weakness: Secondary | ICD-10-CM | POA: Diagnosis not present

## 2021-07-27 DIAGNOSIS — Z20822 Contact with and (suspected) exposure to covid-19: Secondary | ICD-10-CM | POA: Insufficient documentation

## 2021-07-27 DIAGNOSIS — Z23 Encounter for immunization: Secondary | ICD-10-CM | POA: Diagnosis not present

## 2021-07-27 DIAGNOSIS — E119 Type 2 diabetes mellitus without complications: Secondary | ICD-10-CM | POA: Insufficient documentation

## 2021-07-27 DIAGNOSIS — E1165 Type 2 diabetes mellitus with hyperglycemia: Secondary | ICD-10-CM

## 2021-07-27 DIAGNOSIS — K7581 Nonalcoholic steatohepatitis (NASH): Secondary | ICD-10-CM | POA: Insufficient documentation

## 2021-07-27 DIAGNOSIS — E1169 Type 2 diabetes mellitus with other specified complication: Secondary | ICD-10-CM

## 2021-07-27 DIAGNOSIS — Z79899 Other long term (current) drug therapy: Secondary | ICD-10-CM | POA: Diagnosis not present

## 2021-07-27 DIAGNOSIS — Z794 Long term (current) use of insulin: Secondary | ICD-10-CM | POA: Insufficient documentation

## 2021-07-27 DIAGNOSIS — Z7982 Long term (current) use of aspirin: Secondary | ICD-10-CM | POA: Diagnosis not present

## 2021-07-27 DIAGNOSIS — D696 Thrombocytopenia, unspecified: Secondary | ICD-10-CM | POA: Diagnosis not present

## 2021-07-27 DIAGNOSIS — E785 Hyperlipidemia, unspecified: Secondary | ICD-10-CM | POA: Diagnosis present

## 2021-07-27 DIAGNOSIS — I10 Essential (primary) hypertension: Secondary | ICD-10-CM | POA: Diagnosis present

## 2021-07-27 DIAGNOSIS — R2990 Unspecified symptoms and signs involving the nervous system: Secondary | ICD-10-CM | POA: Diagnosis present

## 2021-07-27 DIAGNOSIS — I6503 Occlusion and stenosis of bilateral vertebral arteries: Secondary | ICD-10-CM | POA: Insufficient documentation

## 2021-07-27 DIAGNOSIS — I639 Cerebral infarction, unspecified: Secondary | ICD-10-CM

## 2021-07-27 DIAGNOSIS — R410 Disorientation, unspecified: Secondary | ICD-10-CM

## 2021-07-27 DIAGNOSIS — Z8673 Personal history of transient ischemic attack (TIA), and cerebral infarction without residual deficits: Secondary | ICD-10-CM | POA: Insufficient documentation

## 2021-07-27 DIAGNOSIS — J45909 Unspecified asthma, uncomplicated: Secondary | ICD-10-CM | POA: Diagnosis not present

## 2021-07-27 DIAGNOSIS — I2510 Atherosclerotic heart disease of native coronary artery without angina pectoris: Secondary | ICD-10-CM | POA: Diagnosis not present

## 2021-07-27 DIAGNOSIS — Z7984 Long term (current) use of oral hypoglycemic drugs: Secondary | ICD-10-CM | POA: Insufficient documentation

## 2021-07-27 DIAGNOSIS — Z951 Presence of aortocoronary bypass graft: Secondary | ICD-10-CM | POA: Diagnosis not present

## 2021-07-27 DIAGNOSIS — E1100 Type 2 diabetes mellitus with hyperosmolarity without nonketotic hyperglycemic-hyperosmolar coma (NKHHC): Secondary | ICD-10-CM

## 2021-07-27 DIAGNOSIS — I6523 Occlusion and stenosis of bilateral carotid arteries: Secondary | ICD-10-CM | POA: Diagnosis not present

## 2021-07-27 LAB — DIFFERENTIAL
Abs Immature Granulocytes: 0.01 10*3/uL (ref 0.00–0.07)
Basophils Absolute: 0 10*3/uL (ref 0.0–0.1)
Basophils Relative: 1 %
Eosinophils Absolute: 0.1 10*3/uL (ref 0.0–0.5)
Eosinophils Relative: 3 %
Immature Granulocytes: 1 %
Lymphocytes Relative: 36 %
Lymphs Abs: 0.8 10*3/uL (ref 0.7–4.0)
Monocytes Absolute: 0.2 10*3/uL (ref 0.1–1.0)
Monocytes Relative: 9 %
Neutro Abs: 1.1 10*3/uL — ABNORMAL LOW (ref 1.7–7.7)
Neutrophils Relative %: 50 %

## 2021-07-27 LAB — I-STAT CHEM 8, ED
BUN: 16 mg/dL (ref 8–23)
Calcium, Ion: 1.18 mmol/L (ref 1.15–1.40)
Chloride: 108 mmol/L (ref 98–111)
Creatinine, Ser: 1.1 mg/dL (ref 0.61–1.24)
Glucose, Bld: 136 mg/dL — ABNORMAL HIGH (ref 70–99)
HCT: 39 % (ref 39.0–52.0)
Hemoglobin: 13.3 g/dL (ref 13.0–17.0)
Potassium: 4.1 mmol/L (ref 3.5–5.1)
Sodium: 142 mmol/L (ref 135–145)
TCO2: 23 mmol/L (ref 22–32)

## 2021-07-27 LAB — COMPREHENSIVE METABOLIC PANEL
ALT: 34 U/L (ref 0–44)
AST: 36 U/L (ref 15–41)
Albumin: 3.8 g/dL (ref 3.5–5.0)
Alkaline Phosphatase: 51 U/L (ref 38–126)
Anion gap: 6 (ref 5–15)
BUN: 17 mg/dL (ref 8–23)
CO2: 23 mmol/L (ref 22–32)
Calcium: 8.5 mg/dL — ABNORMAL LOW (ref 8.9–10.3)
Chloride: 109 mmol/L (ref 98–111)
Creatinine, Ser: 1.09 mg/dL (ref 0.61–1.24)
GFR, Estimated: >60 mL/min (ref >60)
Glucose, Bld: 138 mg/dL — ABNORMAL HIGH (ref 70–99)
Potassium: 4 mmol/L (ref 3.5–5.1)
Sodium: 138 mmol/L (ref 135–145)
Total Bilirubin: 0.7 mg/dL (ref 0.3–1.2)
Total Protein: 7.1 g/dL (ref 6.5–8.1)

## 2021-07-27 LAB — CBC
HCT: 40.6 % (ref 39.0–52.0)
Hemoglobin: 14 g/dL (ref 13.0–17.0)
MCH: 32.1 pg (ref 26.0–34.0)
MCHC: 34.5 g/dL (ref 30.0–36.0)
MCV: 93.1 fL (ref 80.0–100.0)
Platelets: 84 10*3/uL — ABNORMAL LOW (ref 150–400)
RBC: 4.36 MIL/uL (ref 4.22–5.81)
RDW: 13.2 % (ref 11.5–15.5)
WBC: 2.2 10*3/uL — ABNORMAL LOW (ref 4.0–10.5)
nRBC: 0 % (ref 0.0–0.2)

## 2021-07-27 LAB — PROTIME-INR
INR: 1.1 (ref 0.8–1.2)
Prothrombin Time: 14.3 seconds (ref 11.4–15.2)

## 2021-07-27 LAB — URINALYSIS, ROUTINE W REFLEX MICROSCOPIC
Bacteria, UA: NONE SEEN
Bilirubin Urine: NEGATIVE
Glucose, UA: NEGATIVE mg/dL
Hgb urine dipstick: NEGATIVE
Ketones, ur: NEGATIVE mg/dL
Leukocytes,Ua: NEGATIVE
Nitrite: NEGATIVE
Protein, ur: 30 mg/dL — AB
Specific Gravity, Urine: 1.036 — ABNORMAL HIGH (ref 1.005–1.030)
pH: 5 (ref 5.0–8.0)

## 2021-07-27 LAB — RAPID URINE DRUG SCREEN, HOSP PERFORMED
Amphetamines: NOT DETECTED
Barbiturates: NOT DETECTED
Benzodiazepines: NOT DETECTED
Cocaine: NOT DETECTED
Opiates: NOT DETECTED
Tetrahydrocannabinol: NOT DETECTED

## 2021-07-27 LAB — RESP PANEL BY RT-PCR (FLU A&B, COVID) ARPGX2
Influenza A by PCR: NEGATIVE
Influenza B by PCR: NEGATIVE
SARS Coronavirus 2 by RT PCR: NEGATIVE

## 2021-07-27 LAB — CBG MONITORING, ED: Glucose-Capillary: 153 mg/dL — ABNORMAL HIGH (ref 70–99)

## 2021-07-27 LAB — APTT: aPTT: 32 seconds (ref 24–36)

## 2021-07-27 LAB — GLUCOSE, CAPILLARY
Glucose-Capillary: 108 mg/dL — ABNORMAL HIGH (ref 70–99)
Glucose-Capillary: 140 mg/dL — ABNORMAL HIGH (ref 70–99)

## 2021-07-27 LAB — AMMONIA: Ammonia: 43 umol/L — ABNORMAL HIGH (ref 9–35)

## 2021-07-27 LAB — ETHANOL: Alcohol, Ethyl (B): <10 mg/dL (ref <10)

## 2021-07-27 MED ORDER — INSULIN GLARGINE-YFGN 100 UNIT/ML ~~LOC~~ SOLN
30.0000 [IU] | Freq: Two times a day (BID) | SUBCUTANEOUS | Status: DC
Start: 2021-07-27 — End: 2021-07-28
  Filled 2021-07-27 (×4): qty 0.3

## 2021-07-27 MED ORDER — CITALOPRAM HYDROBROMIDE 20 MG PO TABS
20.0000 mg | ORAL_TABLET | Freq: Every day | ORAL | Status: DC
  Administered 2021-07-27: 20 mg via ORAL
  Filled 2021-07-27: qty 1

## 2021-07-27 MED ORDER — SODIUM CHLORIDE 0.9 % IV BOLUS
500.0000 mL | Freq: Once | INTRAVENOUS | Status: AC
  Administered 2021-07-27: 500 mL via INTRAVENOUS

## 2021-07-27 MED ORDER — POLYETHYLENE GLYCOL 3350 17 G PO PACK: 17.0000 g | PACK | Freq: Every day | ORAL | Status: DC | PRN

## 2021-07-27 MED ORDER — PRAVASTATIN SODIUM 10 MG PO TABS
20.0000 mg | ORAL_TABLET | Freq: Every day | ORAL | Status: DC
  Administered 2021-07-27: 20 mg via ORAL
  Filled 2021-07-27: qty 2

## 2021-07-27 MED ORDER — ONDANSETRON HCL 4 MG PO TABS: 4.0000 mg | ORAL_TABLET | Freq: Four times a day (QID) | ORAL | Status: DC | PRN

## 2021-07-27 MED ORDER — INFLUENZA VAC SPLIT QUAD 0.5 ML IM SUSY
0.5000 mL | PREFILLED_SYRINGE | INTRAMUSCULAR | Status: AC
  Administered 2021-07-28: 0.5 mL via INTRAMUSCULAR
  Filled 2021-07-27: qty 0.5

## 2021-07-27 MED ORDER — MOMETASONE FURO-FORMOTEROL FUM 200-5 MCG/ACT IN AERO
2.0000 | INHALATION_SPRAY | Freq: Two times a day (BID) | RESPIRATORY_TRACT | Status: DC
  Administered 2021-07-27 – 2021-07-28 (×2): 2 via RESPIRATORY_TRACT
  Filled 2021-07-27: qty 8.8

## 2021-07-27 MED ORDER — ONDANSETRON HCL 4 MG/2ML IJ SOLN: 4.0000 mg | Freq: Four times a day (QID) | INTRAMUSCULAR | Status: DC | PRN

## 2021-07-27 MED ORDER — ACETAMINOPHEN 650 MG RE SUPP: 650.0000 mg | Freq: Four times a day (QID) | RECTAL | Status: DC | PRN

## 2021-07-27 MED ORDER — HYDRALAZINE HCL 20 MG/ML IJ SOLN
10.0000 mg | INTRAMUSCULAR | Status: DC | PRN
Start: 2021-07-27 — End: 2021-07-28
  Filled 2021-07-27: qty 1

## 2021-07-27 MED ORDER — MELATONIN 3 MG PO TABS
9.0000 mg | ORAL_TABLET | Freq: Every day | ORAL | Status: DC
  Administered 2021-07-27: 9 mg via ORAL
  Filled 2021-07-27: qty 3

## 2021-07-27 MED ORDER — INSULIN ASPART 100 UNIT/ML IJ SOLN
0.0000 [IU] | Freq: Three times a day (TID) | INTRAMUSCULAR | Status: DC
Start: 2021-07-27 — End: 2021-07-28

## 2021-07-27 MED ORDER — ACETAMINOPHEN 325 MG PO TABS: 650.0000 mg | ORAL_TABLET | Freq: Four times a day (QID) | ORAL | Status: DC | PRN

## 2021-07-27 MED ORDER — SODIUM CHLORIDE 0.9 % IV SOLN
100.0000 mL/h | INTRAVENOUS | Status: DC
  Administered 2021-07-27 – 2021-07-28 (×3): 100 mL/h via INTRAVENOUS

## 2021-07-27 MED ORDER — INSULIN ASPART 100 UNIT/ML IJ SOLN: 0.0000 [IU] | Freq: Every day | INTRAMUSCULAR | Status: DC

## 2021-07-27 MED ORDER — TOPIRAMATE 25 MG PO TABS
50.0000 mg | ORAL_TABLET | Freq: Every day | ORAL | Status: DC
  Administered 2021-07-27: 50 mg via ORAL
  Filled 2021-07-27: qty 2

## 2021-07-27 MED ORDER — ASPIRIN EC 81 MG PO TBEC
81.0000 mg | DELAYED_RELEASE_TABLET | Freq: Every day | ORAL | Status: DC
  Administered 2021-07-27: 81 mg via ORAL
  Filled 2021-07-27: qty 1

## 2021-07-27 MED ORDER — PANTOPRAZOLE SODIUM 40 MG PO TBEC
40.0000 mg | DELAYED_RELEASE_TABLET | Freq: Every day | ORAL | Status: DC
  Administered 2021-07-27 – 2021-07-28 (×2): 40 mg via ORAL
  Filled 2021-07-27 (×2): qty 1

## 2021-07-27 MED ORDER — EZETIMIBE 10 MG PO TABS
10.0000 mg | ORAL_TABLET | Freq: Every day | ORAL | Status: DC
  Administered 2021-07-27 – 2021-07-28 (×2): 10 mg via ORAL
  Filled 2021-07-27 (×2): qty 1

## 2021-07-27 MED ORDER — LORAZEPAM 2 MG/ML IJ SOLN
0.5000 mg | Freq: Once | INTRAMUSCULAR | Status: AC
  Administered 2021-07-28: 0.5 mg via INTRAVENOUS
  Filled 2021-07-27: qty 1

## 2021-07-27 MED ORDER — IOHEXOL 350 MG/ML SOLN
100.0000 mL | Freq: Once | INTRAVENOUS | Status: AC | PRN
  Administered 2021-07-27: 100 mL via INTRAVENOUS

## 2021-07-27 NOTE — ED Notes (Signed)
Pt returned from CT, teleneuro in progress

## 2021-07-27 NOTE — ED Notes (Signed)
CODE STROKE PAGED.

## 2021-07-27 NOTE — Progress Notes (Signed)
Code stroke time documentation  0488 Call time 1313 Beeper time 8916 Exam started 1323 Exam finished 1323 Images sent to [place] Exam completed in [facility] called

## 2021-07-27 NOTE — ED Notes (Signed)
Patient transported to CT 

## 2021-07-27 NOTE — Consult Note (Signed)
NEUROLOGY TELECONSULTATION NOTE   Date of service: July 27, 2021 Patient Name: [name] [name] MRN:  [id number] DOB:  01-Oct-1956 Reason for consult: telestroke  Requesting Provider: Dorie Rank MD Consult Participants: patient, myself, bedside RN Darnelle Maffucci, telestroke RN Misty Location of the provider: [facility] Location of the patient: [facility]  This consult was provided via telemedicine with 2-way video and audio communication. The patient/family was informed that care would be provided in this way and agreed to receive care in this manner.   _ _ _   _ __   _ __ _ _  __ __   _ __   __ _  History of Present Illness   65 yo gentleman with hx HTN, HL, DM2, cirrhosis, thrombocytopenia (plts today 11) who presents as telestroke to [facility] for dizziness, dragging L side, and mild word finding difficulties. LKW unclear. Patient's wife was called to his office at 11am after he became agitated after not being allowed to go on a break. She noticed at that point he was dragging his L side when he walked and having some mild word-finding difficulties. He states that he started feel dizzy before that, not sure what time, maybe this morning or possibly before. Patient not a candidate for TNK 2/2 unclear LKW as well as contraindication of platelet count <100. CT head wo contrast NAICP, no ICH, ASPECTS 10. NIHSS = 7 for facial palsy, LUE 1, LLE 2, sensory, language, dysarthria. During the exam patient also had bilateral horizontal direction-changing nystagmus and diplopia on accomodation. CTAshowed no LVO. CTP neg.   ROS   10 point review of systems was performed and was negative except as described in HPI.  Past History   Past Medical History:  Diagnosis Date  . Anxiety   . Arthritis   . Asthma   . Cataract    Right eye  . Chronic lower back pain   . Cirrhosis of liver ([place])   . Coronary atherosclerosis of native coronary artery    a. LAD & OM stenting;  b. 2007 Cath: nonobs dzs, patent stents;  c. 07/2012  neg MV, EF 61%.  d. 11/20/14: [place] s/p  PCI w/ DES to pLAD and DES to OM1    . Essential hypertension   . GERD (gastroesophageal reflux disease)   . Hearing loss of left ear   . History of gout   . History of hiatal hernia   . Hypercholesteremia   . Lumbar herniated disc   . MI, old 2017  . Migraine   . OSA (obstructive sleep apnea)   . 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 Thrombocytopenia ([place])   . TIA (transient ischemic attack) ~ 2010  . Type 2 diabetes mellitus ([place])    Past Surgical History:  Procedure Laterality Date  . ANTERIOR CERVICAL DECOMP/DISCECTOMY FUSION  1998   "C3-4"  . CARDIAC CATHETERIZATION  "several"  . CARDIAC CATHETERIZATION N/A 12/30/2015   Procedure: Left Heart Cath and Coronary Angiography;  Surgeon: [name], MD;  Location: [place] [facility];  Service: Cardiovascular;  Laterality: N/A;  . CARPAL TUNNEL RELEASE Bilateral 2005  . CHOLECYSTECTOMY N/A 11/28/2018   Procedure: LAPAROSCOPIC CHOLECYSTECTOMY;  Surgeon: [name], MD;  Location: [facility];  Service: General;  Laterality: N/A;  . COLONOSCOPY    . COLONOSCOPY WITH PROPOFOL N/A 09/27/2020   Procedure: COLONOSCOPY WITH PROPOFOL;  Surgeon: Jenetta Downer  Roney Marion, MD;  Location: [facility];  Service: Gastroenterology;  Laterality: N/A;  . CORONARY ANGIOPLASTY    . CORONARY ANGIOPLASTY WITH STENT PLACEMENT  2002; 2003; 11/20/2014   "I have 4 stents after today" (11/20/2014)  . CORONARY ARTERY BYPASS GRAFT N/A 01/09/2016   Procedure: CORONARY ARTERY BYPASS GRAFTING (CABG) X 3 UTILIZING RIGHT AND LEFT INTERNAL MAMMARY ARTERY AND ENDOSCOPICALLY HARVESTED SAPHENEOUS VEIN.;  Surgeon: Rexene Alberts, MD;  Location: [place];  Service: Open Heart Surgery;  Laterality: N/A;  . ESOPHAGOGASTRODUODENOSCOPY    . ESOPHAGOGASTRODUODENOSCOPY (EGD) WITH PROPOFOL N/A 09/27/2020   Procedure: ESOPHAGOGASTRODUODENOSCOPY (EGD) WITH PROPOFOL;   Surgeon: [name], MD;  [facility];  Service: Gastroenterology;  Laterality: N/A;  10  . KNEE SURGERY Left 02/2012   "scraped; open"  . LEFT HEART CATH AND CORS/GRAFTS ANGIOGRAPHY N/A 08/09/2017   Procedure: LEFT HEART CATH AND CORS/GRAFTS ANGIOGRAPHY;  Surgeon: [name], MD;  Location: [place] [facility];  Service: Cardiovascular;  Laterality: N/A;  . LEFT HEART CATH AND CORS/GRAFTS ANGIOGRAPHY N/A 11/22/2018   Procedure: LEFT HEART CATH AND CORS/GRAFTS ANGIOGRAPHY;  Surgeon: [name], [name], MD;  Location: [place] [facility];  Service: Cardiovascular;  Laterality: N/A;  . LEFT HEART CATHETERIZATION WITH CORONARY ANGIOGRAM N/A 07/20/2012   Procedure: LEFT HEART CATHETERIZATION WITH CORONARY ANGIOGRAM;  Surgeon: [name], MD;  Location: [place] [facility];  Service: Cardiovascular;  Laterality: N/A;  . LEFT HEART CATHETERIZATION WITH CORONARY ANGIOGRAM N/A 11/20/2014   Procedure: LEFT HEART CATHETERIZATION WITH CORONARY ANGIOGRAM;  Surgeon: [name], MD;  Location: [facility] CATH LAB;  Service: Cardiovascular;  Laterality: N/A;  . LEFT HEART CATHETERIZATION WITH CORONARY ANGIOGRAM N/A 11/26/2014   Procedure: LEFT HEART CATHETERIZATION WITH CORONARY ANGIOGRAM;  Surgeon: [name], MD;  Location: [facility] C[facility] LAB;  Service: Cardiovascular;  Laterality: N/A;  . NEUROPLASTY / TRANSPOSITION ULNAR NERVE AT ELBOW Right ~ 2012  . PERCUTANEOUS CORONARY ROTOBLATOR INTERVENTION (PCI-R)  11/20/2014   Procedure: PERCUTANEOUS CORONARY ROTOBLATOR INTERVENTION (PCI-R);  Surgeon: [name], MD;  Location: [facility] CATH LAB;  Service: Cardiovascular;;  . SHOULDER ARTHROSCOPY Left ~ 2011  . TEE WITHOUT CARDIOVERSION N/A 01/09/2016   Procedure: TRANSESOPHAGEAL ECHOCARDIOGRAM (TEE);  Surgeon: Rexene Alberts, MD;  Location: [place];  Service: Open Heart Surgery;  Laterality: N/A;   Family History  Problem Relation Age of Onset  . Stroke Mother   . Coronary artery disease Father 7  .  Asthma Sister   . Multiple sclerosis Brother   . Heart disease Paternal Uncle   . Stroke Maternal Grandmother   . Heart attack Paternal Grandmother   . Cancer Paternal Grandfather   . Asthma Sister   . Seizures Son   . Migraines Son   . Autism Son   . Migraines Son    Social History   Socioeconomic History  . Marital status: Married    Spouse name: [name]  . Number of children: 3  . Years of education: 6 th   . Highest education level: Not on file  Occupational History  . Occupation: Unemployed  Tobacco Use  . Smoking status: Never  . Smokeless tobacco: Never  Vaping Use  . Vaping Use: Never used  Substance and Sexual Activity  . Alcohol use: No  . Drug use: No  . Sexual activity: Not on file  Other Topics Concern  . Not on file  Social History Narrative   Patient lives at home with wife [name].  Patient works at [employer], Engineer, petroleum.   Patient has a 12 th grade education.    Patient has 3 children.       Social Determinants of Health   Financial Resource Strain: Low Risk   . Difficulty of Paying Living Expenses: Not hard at all  Food Insecurity: No Food Insecurity  . Worried About Charity fundraiser in the Last Year: Never true  . Ran Out of Food in the Last Year: Never true  Transportation Needs: No Transportation Needs  . Lack of Transportation (Medical): No  . Lack of Transportation (Non-Medical): No  Physical Activity: Sufficiently Active  . Days of Exercise per Week: 6 days  . Minutes of Exercise per Session: 60 min  Stress: No Stress Concern Present  . Feeling of Stress : Not at all  Social Connections: Socially Integrated  . Frequency of Communication with Friends and Family: More than three times a week  . Frequency of Social Gatherings with Friends and Family: More than three times a week  . Attends Religious Services: More than 4 times per year  . Active Member of Clubs or Organizations: Yes  . Attends Archivist  Meetings: More than 4 times per year  . Marital Status: Married   Allergies  Allergen Reactions  . Divalproex Sodium Other (See Comments)    Causes anger  . Statins Other (See Comments)    Muscle aches and cramps  . Tramadol Other (See Comments)    Chest pain   . Valproic Acid Other (See Comments)    Causes anger  . Other Other (See Comments)    Other reaction(s): Unknown  . Gadolinium Derivatives Nausea And Vomiting    07/25/19 Pt vomited immediately after IV gad. Denies itching, dyspnea.  (Adverse, not allergic, reaction  . Tricor [Fenofibrate] Other (See Comments)    Leg cramps    Medications    Current Facility-Administered Medications:  .  [COMPLETED] sodium chloride 0.9 % bolus 500 mL, 500 mL, Intravenous, Once, Last Rate: 500 mL/hr at 07/27/21 1328, 500 mL at 07/27/21 1328 **FOLLOWED BY** 0.9 %  sodium chloride infusion, 100 mL/hr, Intravenous, Continuous, Dorie Rank, MD  Current Outpatient Medications:  .  albuterol (PROVENTIL HFA;VENTOLIN HFA) 108 (90 BASE) MCG/ACT inhaler, Inhale 2 puffs into the lungs every 6 (six) hours as needed for wheezing or shortness of breath. , Disp: , Rfl:  .  aspirin EC 81 MG tablet, Take 81 mg by mouth at bedtime. , Disp: , Rfl:  .  carvedilol (COREG) 3.125 MG tablet, Take 1 tablet (3.125 mg total) by mouth 2 (two) times daily., Disp: 60 tablet, Rfl: 11 .  cholecalciferol (VITAMIN D3) 25 MCG (1000 UNIT) tablet, Take 1,000 Units by mouth daily., Disp: , Rfl:  .  citalopram (CELEXA) 20 MG tablet, Take 20 mg by mouth at bedtime. , Disp: , Rfl:  .  cyclobenzaprine (FLEXERIL) 10 MG tablet, Take 10 mg by mouth 3 (three) times daily as needed for muscle spasms., Disp: , Rfl:  .  ezetimibe (ZETIA) 10 MG tablet, Take 10 mg by mouth daily., Disp: , Rfl:  .  ferrous sulfate 325 (65 FE) MG EC tablet, Take 325 mg by mouth once a week., Disp: , Rfl:  .  fluticasone (FLONASE) 50 MCG/ACT nasal spray, Place 1 spray into the nose 2 (two) times daily. , Disp: ,  Rfl:  .  Fluticasone-Salmeterol (ADVAIR) 250-50 MCG/DOSE AEPB, Inhale 1 puff into the lungs every 12 (twelve) hours.,  Disp: , Rfl:  .  furosemide (LASIX) 20 MG tablet, Take 20 mg by mouth daily., Disp: , Rfl:  .  gabapentin (NEURONTIN) 300 MG capsule, Take 300 mg by mouth 3 (three) times daily., Disp: , Rfl: 2 .  insulin aspart (NOVOLOG) 100 UNIT/ML FlexPen, Inject 0-30 Units into the skin 3 (three) times daily as needed for high blood sugar (CBG >150). Per sliding scale, Disp: , Rfl:  .  isosorbide mononitrate (IMDUR) 60 MG 24 hr tablet, Take 1 tablet (60 mg total) by mouth daily., Disp: 90 tablet, Rfl: 1 .  meclizine (ANTIVERT) 25 MG tablet, Take 1 tablet by mouth 3 (three) times daily as needed., Disp: , Rfl:  .  Melatonin 5 MG TABS, Take 10 mg by mouth at bedtime., Disp: , Rfl:  .  metFORMIN (GLUCOPHAGE) 1000 MG tablet, Take 1 tablet (1,000 mg total) by mouth 2 (two) times daily. For the next 2 days as you did receive IV contrast, then resume on 11/26/2018 (Patient taking differently: Take 1,000 mg by mouth 2 (two) times daily.), Disp: 60 tablet, Rfl: 11 .  nitroGLYCERIN (NITROSTAT) 0.4 MG SL tablet, Place 0.4 mg under the tongue every 5 (five) minutes x 3 doses as needed for chest pain (if no relief after 2nd dose, proceed to the ED for an evaluation or call 911)., Disp: , Rfl:  .  omeprazole (PRILOSEC) 20 MG capsule, Take 20 mg by mouth 2 (two) times daily., Disp: , Rfl:  .  topiramate (TOPAMAX) 50 MG tablet, Take 50 mg by mouth at bedtime. , Disp: , Rfl:  .  TRESIBA FLEXTOUCH 100 UNIT/ML SOPN FlexTouch Pen, Inject 60-70 Units into the skin in the morning and at bedtime. Inject 60 units in the AM and 70 units in the PM., Disp: , Rfl: 5 .  vitamin B-12 (CYANOCOBALAMIN) 1000 MCG tablet, Take 1,000 mcg by mouth daily. , Disp: , Rfl:  .  zinc gluconate 50 MG tablet, Take 50 mg by mouth daily., Disp: , Rfl:      Vitals   Vitals:   07/27/21 1341 07/27/21 1350  BP: (!) 151/81   Resp: 14   Temp:   98 F (36.7 C)  TempSrc:  Oral  SpO2: 98%      There is no height or weight on file to calculate BMI.  Physical Exam   Exam performed over telemedicine with 2-way video and audio communication and with assistance of bedside RN  Physical Exam Gen: A&O x3 but mildly confused during conversation, NAD Resp: normal WOB CV: extremities appear well-perfused  Neuro: *MS: A&O x3 but mildly confused during conversation. Follows simple commands but get confused on multistep *Speech: mild dysarthria, mild word finding difficulties during conversation but able to name and repeat *CN: PERRL 35m, EOMI, VFF by confrontation, sensation impaired L face, L UMN facial droop, hearing intact to voice *Motor:   Normal bulk.  No tremor, rigidity or bradykinesia. RUE and RLE full strength. LUE drift but not to bed. LLE some effort against gravity but limb falls to bed after 1-2 seconds. *Sensory: Sensory impairment LUE and LLE. No double-simultaneous extinction.  *Coordination:  FNF intact *Reflexes:  UTA 2/2 tele-exam *Gait: deferred  NIHSS  1a Level of Conscious.: 0 1b LOC Questions: 0 1c LOC Commands: 0 2 Best Gaze: 0 3 Visual: 0 4 Facial Palsy: 1 5a Motor Arm - left: 1 5b Motor Arm - Right: 0 6a Motor Leg - Left: 2 6b Motor Leg - Right: 0 7  Limb Ataxia: 0 8 Sensory: 1 9 Best Language: 1 10 Dysarthria: 1 11 Extinct. and Inatten.: 0  TOTAL: 7   Premorbid mRS = 1   Labs   CBC:  Recent Labs  Lab 07/27/21 1316 07/27/21 1322  WBC 2.2*  --   NEUTROABS 1.1*  --   HGB 14.0 13.3  HCT 40.6 39.0  MCV 93.1  --   PLT 84*  --     Basic Metabolic Panel:  Lab Results  Component Value Date   NA 142 07/27/2021   K 4.1 07/27/2021   CO2 23 07/27/2021   GLUCOSE 136 (H) 07/27/2021   BUN 16 07/27/2021   CREATININE 1.10 07/27/2021   CALCIUM 8.5 (L) 07/27/2021   GFRNONAA >60 07/27/2021   GFRAA >60 07/27/2020   Lipid Panel:  Lab Results  Component Value Date   LDLCALC 86 02/20/2020    HgbA1c:  Lab Results  Component Value Date   HGBA1C 8.1 (H) 02/20/2020   Urine Drug Screen:     Component Value Date/Time   LABOPIA NONE DETECTED 10/29/2015 1533   COCAINSCRNUR NONE DETECTED 10/29/2015 1533   LABBENZ NONE DETECTED 10/29/2015 1533   AMPHETMU NONE DETECTED 10/29/2015 1533   THCU NONE DETECTED 10/29/2015 1533   LABBARB NONE DETECTED 10/29/2015 1533    Alcohol Level     Component Value Date/Time   ETH <10 07/27/2021 1322     Impression   65 yo gentleman with hx HTN, HL, DM2, cirrhosis, thrombocytopenia (plts today 61) who presents as telestroke to [facility] for dizziness, dragging L side, and mild word finding difficulties c/f acute ischemic stroke. Not a TNK candidate 2/2 unclear LKW and thrombocytopenia plts <100K. CTA no LVO / CTP no core or penumbra.  Recommendations   - Admit to hospitalist service for stroke w/u - Inpatient neurology consult when available (tomorrow is fine) - Permissive HTN x48 hrs from sx onset or until stroke ruled out by MRI goal BP <220/110. PRN labetalol or hydralazine if BP above these parameters. Avoid oral antihypertensives. - MRI brain wo contrast - TTE  - Check A1c and LDL + add statin per guidelines - Continue ASA 78m daily. Not started on DAPT 2/2 thrombocytopenia.  - q4 hr neuro checks - STAT head CT for any change in neuro exam - Tele - PT/OT/SLP - Stroke education - Amb referral to neurology upon discharge   ______________________________________________________________________   Thank you for the opportunity to take part in the care of this patient. If you have any further questions, please contact the neurology consultation attending.  Signed,  CSu Monks MD [facility] [phone] If 7pm- 7am, please page neurology on call as listed in [place]

## 2021-07-27 NOTE — H&P (Signed)
History and Physical    [name] [name] QMG:[id number] DOB: November 17, 1955 DOA: 07/27/2021  PCP: Dorothyann Peng, FNP   Patient coming from: Home  I have personally briefly reviewed patient's old medical records in [place]  Chief Complaint: Dizziness, left leg weakness  HPI: [name] [name] is a 65 y.o. male with medical history significant for  CABG, prior stroke, CHF, [place] liver cirrhosis, diabetes mellitus. Patient was brought to the ED reports of confusion, facial numbness, left-sided weakness and light headedness. Spouse is at bedside and assist with the history, the time of my evaluation patient is awake, appears slightly confused and tells me does not remember a lot of what happened. Patient works at [employer], he mixes the dough.  While at work, he started having dizziness, does not know exactly when the dizziness started. At about 11, patient became agitated and was arguing with his manager because he was not allowed to go on break to eat.  About 12 spouse called to patient's place of work, and noted that he was dragging his left side.  He was also confused.  She immediately brought patient to the ED.  Patient had a stroke in 2019, where he presented at [facility] and got tPA, at that time he had weakness involving 1 side(spouse unsure which), after that stroke he had no residual deficits.  Patient has been compliant with his aspirin.  Ports good oral intake continue today.  No vomiting no loose stools no cough no difficulty breathing no pain with urination.  At the time of my evaluation, he is awake, selectively answering questions, reports some improvement in his symptoms since onset, but is mostly complaining of pain in his left upper and lower extremity.  ED Course: Systolic 326Z.  WBC 2.2, platelets 84.  Ammonia level 43.  INR 1.1.  Blood alcohol level less than 10.  Code stroke was called, head CT was without acute abnormality.  CTA head and neck without large vessel  occlusion. Telemetry neurologist recommended admission at [facility] for stroke work-up.  Review of Systems: As per HPI all other systems reviewed and negative.  Past Medical History:  Diagnosis Date   Anxiety    Arthritis    Asthma    Cataract    Right eye   Chronic lower back pain    Cirrhosis of liver ([place])    Coronary atherosclerosis of native coronary artery    a. LAD & OM stenting;  b. 2007 Cath: nonobs dzs, patent stents;  c. 07/2012 neg MV, EF 61%.  d. 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   LIMA to LAD, free RIMA to OM2, SVG to PDA, open SVG harvest from right thigh   Thrombocytopenia (HCC)    TIA (transient ischemic attack) ~ 2010   Type 2 diabetes mellitus ([place])     Past Surgical History:  Procedure Laterality Date   ANTERIOR CERVICAL DECOMP/DISCECTOMY FUSION  1998   "C3-4"   CARDIAC CATHETERIZATION  "several"   CARDIAC CATHETERIZATION N/A 12/30/2015   Procedure: Left Heart Cath and Coronary Angiography;  Surgeon: [name], MD;  Location: [place] [facility];  Service: Cardiovascular;  Laterality: N/A;   CARPAL TUNNEL RELEASE Bilateral 2005   CHOLECYSTECTOMY N/A 11/28/2018   Procedure: LAPAROSCOPIC CHOLECYSTECTOMY;  Surgeon: [name], MD;  Location: [facility];  Service: General;  Laterality: N/A;   COLONOSCOPY     COLONOSCOPY WITH PROPOFOL N/A 09/27/2020   Procedure: COLONOSCOPY WITH PROPOFOL;  Surgeon: [name], MD;  Location: [facility];  Service: Gastroenterology;  Laterality: N/A;   CORONARY ANGIOPLASTY     CORONARY ANGIOPLASTY WITH STENT PLACEMENT  2002; 2003; 11/20/2014   "I have 4 stents after today" (11/20/2014)   CORONARY ARTERY BYPASS  GRAFT N/A 01/09/2016   Procedure: CORONARY ARTERY BYPASS GRAFTING (CABG) X 3 UTILIZING RIGHT AND LEFT INTERNAL MAMMARY ARTERY AND ENDOSCOPICALLY HARVESTED SAPHENEOUS VEIN.;  Surgeon: Rexene Alberts, MD;  Location: [place];  Service: Open Heart Surgery;  Laterality: N/A;   ESOPHAGOGASTRODUODENOSCOPY     ESOPHAGOGASTRODUODENOSCOPY (EGD) WITH PROPOFOL N/A 09/27/2020   Procedure: ESOPHAGOGASTRODUODENOSCOPY (EGD) WITH PROPOFOL;  Surgeon: [name], MD;  Location: [facility];  Service: Gastroenterology;  Laterality: N/A;  10   KNEE SURGERY Left 02/2012   "scraped; open"   LEFT HEART CATH AND CORS/GRAFTS ANGIOGRAPHY N/A 08/09/2017   Procedure: LEFT HEART CATH AND CORS/GRAFTS ANGIOGRAPHY;  Surgeon: [name], MD;  Location: [place] [facility];  Service: Cardiovascular;  Laterality: N/A;   LEFT HEART CATH AND CORS/GRAFTS ANGIOGRAPHY N/A 11/22/2018   Procedure: LEFT HEART CATH AND CORS/GRAFTS ANGIOGRAPHY;  Surgeon: [name], [name], MD;  Location: [place] [facility];  Service: Cardiovascular;  Laterality: N/A;   LEFT HEART CATHETERIZATION WITH CORONARY ANGIOGRAM N/A 07/20/2012   Procedure: LEFT HEART CATHETERIZATION WITH CORONARY ANGIOGRAM;  Surgeon: [name], MD;  Location: [place] CATH LAB;  Service: Cardiovascular;  Laterality: N/A;   LEFT HEART CATHETERIZATION WITH CORONARY ANGIOGRAM N/A 11/20/2014   Procedure: LEFT HEART CATHETERIZATION WITH CORONARY ANGIOGRAM;  Surgeon: [name], MD;  Location: [facility] CATH LAB;  Service: Cardiovascular;  Laterality: N/A;   LEFT HEART CATHETERIZATION WITH CORONARY ANGIOGRAM N/A 11/26/2014   Procedure: LEFT HEART CATHETERIZATION WITH CORONARY ANGIOGRAM;  Surgeon: [name], MD;  Location: [facility] CATH LAB;  Service: Cardiovascular;  Laterality: N/A;   NEUROPLASTY / TRANSPOSITION ULNAR NERVE AT ELBOW Right ~ 2012   PERCUTANEOUS CORONARY ROTOBLATOR INTERVENTION (PCI-R)  11/20/2014   Procedure: PERCUTANEOUS CORONARY ROTOBLATOR INTERVENTION (PCI-R);   Surgeon: [name], MD;  Location: [facility] CATH LAB;  Service: Cardiovascular;;   SHOULDER ARTHROSCOPY Left ~ 2011   TEE WITHOUT CARDIOVERSION N/A 01/09/2016   Procedure: TRANSESOPHAGEAL ECHOCARDIOGRAM (TEE);  Surgeon: Rexene Alberts, MD;  Location: [place];  Service: Open Heart Surgery;  Laterality: N/A;     reports that he has never smoked. He has never used smokeless tobacco. He reports that he does not drink alcohol and does not use drugs.  Allergies  Allergen Reactions   Divalproex Sodium Other (See Comments)    Causes anger   Statins Other (See Comments)    Muscle aches and cramps   Tramadol Other (See Comments)    Chest pain    Valproic Acid Other (See Comments)    Causes anger   Other Other (See Comments)    Other reaction(s): Unknown   Gadolinium Derivatives Nausea And Vomiting    07/25/19 Pt vomited immediately after IV gad. Denies itching, dyspnea.  (Adverse, not allergic, reaction   Tricor [Fenofibrate] Other (See Comments)  Leg cramps    Family History  Problem Relation Age of Onset   Stroke Mother    Coronary artery disease Father 81   Asthma Sister    Multiple sclerosis Brother    Heart disease Paternal Uncle    Stroke Maternal Grandmother    Heart attack Paternal Grandmother    Cancer Paternal Grandfather    Asthma Sister    Seizures Son    Migraines Son    Autism Son    Migraines Son    Prior to Admission medications   Medication Sig Start Date End Date Taking? Authorizing Provider  albuterol (PROVENTIL HFA;VENTOLIN HFA) 108 (90 BASE) MCG/ACT inhaler Inhale 2 puffs into the lungs every 6 (six) hours as needed for wheezing or shortness of breath.    Yes [provider]  aspirin EC 81 MG tablet Take 81 mg by mouth at bedtime.    Yes [provider]  carvedilol (COREG) 3.125 MG tablet Take 1 tablet (3.125 mg total) by mouth 2 (two) times daily. 09/27/20 09/27/21 Yes [name], MD  cholecalciferol (VITAMIN D3) 25 MCG (1000  UNIT) tablet Take 1,000 Units by mouth daily.   Yes [provider]  citalopram (CELEXA) 20 MG tablet Take 20 mg by mouth at bedtime.    Yes [provider]  cyclobenzaprine (FLEXERIL) 10 MG tablet Take 10 mg by mouth 3 (three) times daily as needed for muscle spasms. 08/15/20  Yes [provider]  ezetimibe (ZETIA) 10 MG tablet Take 10 mg by mouth daily.   Yes [provider]  ferrous sulfate 325 (65 FE) MG EC tablet Take 325 mg by mouth once a week. Takes on Mondays.   Yes [provider]  fluticasone (FLONASE) 50 MCG/ACT nasal spray Place 1 spray into the nose 2 (two) times daily.    Yes [provider]  Fluticasone-Salmeterol (ADVAIR) 250-50 MCG/DOSE AEPB Inhale 1 puff into the lungs every 12 (twelve) hours.   Yes [provider]  furosemide (LASIX) 20 MG tablet Take 20 mg by mouth daily. 10/14/19  Yes [provider]  gabapentin (NEURONTIN) 300 MG capsule Take 300 mg by mouth 3 (three) times daily. 07/09/17  Yes [provider]  insulin glargine (LANTUS) 100 UNIT/ML Solostar Pen Inject 70 Units into the skin daily.   Yes [provider]  isosorbide mononitrate (IMDUR) 60 MG 24 hr tablet Take 1 tablet (60 mg total) by mouth daily. 05/22/21  Yes Verta Ellen., NP  Melatonin 5 MG TABS Take 10 mg by mouth at bedtime.   Yes [provider]  metFORMIN (GLUCOPHAGE) 1000 MG tablet Take 1 tablet (1,000 mg total) by mouth 2 (two) times daily. For the next 2 days as you did receive IV contrast, then resume on 11/26/2018 Patient taking differently: Take 1,000 mg by mouth 2 (two) times daily. 11/23/18  Yes Elgergawy, Silver Huguenin, MD  nitroGLYCERIN (NITROSTAT) 0.4 MG SL tablet Place 0.4 mg under the tongue every 5 (five) minutes x 3 doses as needed for chest pain (if no relief after 2nd dose, proceed to the ED for an evaluation or call 911).   Yes [provider]  omeprazole (PRILOSEC) 20 MG capsule Take 20  mg by mouth 2 (two) times daily. 04/18/20  Yes [provider]  pravastatin (PRAVACHOL) 20 MG tablet Take 20 mg by mouth daily. 07/01/21  Yes [provider]  topiramate (TOPAMAX) 50 MG tablet Take 50 mg by mouth at bedtime.  04/18/20  Yes [provider]  TRESIBA FLEXTOUCH 100 UNIT/ML SOPN FlexTouch Pen Inject 60-70 Units into the skin in the morning and at bedtime. Inject 60 units in the AM and 70 units in the PM. 08/11/18  Yes [provider]  vitamin B-12 (CYANOCOBALAMIN) 1000 MCG tablet Take 1,000 mcg by mouth daily.    Yes [provider]  zinc gluconate 50 MG tablet Take 50 mg by mouth daily.   Yes [provider]  insulin aspart (NOVOLOG) 100 UNIT/ML FlexPen Inject 0-30 Units into the skin 3 (three) times daily as needed for high blood sugar (CBG >150). Per sliding scale    [provider]    Physical Exam: Vitals:   07/27/21 1350 07/27/21 1440 07/27/21 1500 07/27/21 1530  BP:  (!) 155/82 (!) 153/83 (!) 155/78  Pulse:  73 72 64  Resp:  16 15 (!) 21  Temp: 98 F (36.7 C)     TempSrc: Oral     SpO2:  98% 97% 97%    Constitutional: NAD, calm, comfortable Vitals:   07/27/21 1350 07/27/21 1440 07/27/21 1500 07/27/21 1530  BP:  (!) 155/82 (!) 153/83 (!) 155/78  Pulse:  73 72 64  Resp:  16 15 (!) 21  Temp: 98 F (36.7 C)     TempSrc: Oral     SpO2:  98% 97% 97%   Eyes: PERRL, lids and conjunctivae normal ENMT: Mucous membranes are moist.   Neck: normal, supple, no masses, no thyromegaly Respiratory: clear to auscultation bilaterally, no wheezing, no crackles. Normal respiratory effort. No accessory muscle use.  Cardiovascular: Regular rate and rhythm, no murmurs / rubs / gallops. No extremity edema. 2+ pedal pulses.  Abdomen: no tenderness, no masses palpated. No hepatosplenomegaly. Bowel sounds positive.  Musculoskeletal: no clubbing / cyanosis. No joint deformity upper and lower extremities. Good ROM, no contractures.  Normal muscle tone.  Skin: no rashes, lesions, ulcers. No induration Neurologic:  Neurological:     Mental Status: She is alert.     GCS: GCS eye subscore is 4. GCS verbal subscore is 5. GCS motor subscore is 6.     Comments: Mental Status:  Alert, oriented, thought content appropriate,  Speech fluent without evidence of aphasia. Able to follow 2 step commands without difficulty.  Cranial Nerves:  II:   pupils equal, round, reactive to light III,IV, VI: ptosis not present, extra-ocular motions intact bilaterally  V,VII: smile symmetric, eyebrows raise symmetric, facial light touch sensation subjectively reduced on the left side VIII: hearing grossly normal to voice  XI: bilateral shoulder shrug symmetric and strong XII: midline tongue extension without fassiculations Motor:  Normal tone.  4+/5 strength in all extremities, no appreciable differential weakness.  Subjective reduced sensation left extremities.    Psychiatric: Awake and alert, appears slightly confused, but speech is appropriate but low volume.  Answers mostly single words in response to questions  Labs on Admission: I have personally reviewed following labs and imaging studies  CBC: Recent Labs  Lab 07/27/21 1316 07/27/21 1322  WBC 2.2*  --   NEUTROABS 1.1*  --   HGB 14.0 13.3  HCT 40.6 39.0  MCV 93.1  --   PLT 84*  --    Basic Metabolic Panel: Recent Labs  Lab 07/27/21 1316 07/27/21 1322  NA 138 142  K 4.0 4.1  CL 109 108  CO2 23  --   GLUCOSE 138* 136*  BUN 17 16  CREATININE 1.09 1.10  CALCIUM 8.5*  --  GFR: CrCl cannot be calculated (Unknown ideal weight.). Liver Function Tests: Recent Labs  Lab 07/27/21 1316  AST 36  ALT 34  ALKPHOS 51  BILITOT 0.7  PROT 7.1  ALBUMIN 3.8   No results for input(s): LIPASE, AMYLASE in the last 168 hours. Recent Labs  Lab 07/27/21 1316  AMMONIA 43*   Coagulation Profile: Recent Labs  Lab 07/27/21 1316  INR 1.1   CBG: Recent Labs  Lab  07/27/21 1311  GLUCAP 153*   Urine analysis:    Component Value Date/Time   COLORURINE YELLOW 07/27/2020 0430   APPEARANCEUR CLEAR 07/27/2020 0430   LABSPEC 1.024 07/27/2020 0430   PHURINE 5.0 07/27/2020 0430   GLUCOSEU NEGATIVE 07/27/2020 0430   HGBUR NEGATIVE 07/27/2020 0430   BILIRUBINUR NEGATIVE 07/27/2020 0430   KETONESUR 20 (A) 07/27/2020 0430   PROTEINUR 30 (A) 07/27/2020 0430   UROBILINOGEN 0.2 12/04/2013 0730   NITRITE NEGATIVE 07/27/2020 0430   LEUKOCYTESUR NEGATIVE 07/27/2020 0430    Radiological Exams on Admission: CT HEAD CODE STROKE WO CONTRAST  Result Date: 07/27/2021 CLINICAL DATA:  Code stroke.  Left-sided weakness EXAM: CT HEAD WITHOUT CONTRAST TECHNIQUE: Contiguous axial images were obtained from the base of the skull through the vertex without intravenous contrast. COMPARISON:  Brain MRI 02/20/2020, CT head 03/06/2020 FINDINGS: Brain: There is no acute intracranial hemorrhage, extra-axial fluid collection, or acute infarct. There is a small remote lacunar infarct in the left caudate head. There is mild parenchymal volume loss. The ventricles are not enlarged. There is no mass lesion. There is no midline shift. Vascular: There is calcification of the bilateral cavernous ICAs. There is no hyperdense vessel. Skull: Normal. Negative for fracture or focal lesion. Sinuses/Orbits: The paranasal sinuses are clear. Bilateral lens implants are in place. The globes and orbits are otherwise unremarkable. Other: A subcentimeter nodule in the left parietal scalp may reflect a trichilemmal cyst. A few other areas of soft tissue thickening in the scalp are nonspecific. ASPECTS [facility] Stroke Program Early CT Score) - Ganglionic level infarction (caudate, lentiform nuclei, internal capsule, insula, M1-M3 cortex): 7 - Supraganglionic infarction (M4-M6 cortex): 3 Total score (0-10 with 10 being normal): 10 IMPRESSION: 1. No acute intracranial hemorrhage or infarct. 2. ASPECTS is 10 These  results were called by telephone at the time of interpretation on 07/27/2021 at 1:35 pm to provider [facility] , who verbally acknowledged these results. Electronically Signed   By: Valetta Mole M.D.   On: 07/27/2021 13:38    EKG: Independently reviewed.  Sinus Rhythm, rate 69, QTc 446.  No significant change from prior.  Assessment/Plan Principal Problem:   Stroke-like symptoms Active Problems:   Essential hypertension, benign   Type 2 diabetes mellitus (HCC)   Thrombocytopenia (HCC)   Hyperlipidemia   S/P CABG x 3   Cerebral thrombosis with cerebral infarction   Liver cirrhosis secondary to NASH (HCC)  Stroke-like symptoms-reported left lower extremity weakness, dizziness, numbness involving the left side of his face, confusion.  Apart from subjective reduced sensation on left side of face and left extremities, without focal neurologic deficits appreciated.  Head CT without acute abnormality, CTA head and neck without large vessel occlusion. LKN- unknown.  Reports compliance with aspirin. - Code stroke called, not a candidate for tPA, as last known well unknown, also thrombocytopenic.   - Lipid panel, hemoglobin A1c - MRI brain wo contrast - Echo - PT -Passed bedside swallow evaluation -Continue aspirin 81 mg daily, no started on DAPT secondary to thrombocytopenia -Continue  pravastatin 20 mg daily, (reported allergy to statins, muscle aches), would benefit from high intensitystatin, but per notes is beginning to have muscle aches from this current statin dose. -Allow for permissive hypertension for 48 hours until stroke ruled out by MRI and goal blood pressure less than 220/110.  CABG 2017-stable.  No chest pain, EKG unchanged. -Hold Imdur, Coreg Lasix for now  HTN- allow for permissive hypertension -As needed hydralazine -Hold Imdur, Coreg, Lasix for now  Diabetes mellitus, neuropathy random glucose 138 - HgbA1c - SSI- M -Resume home long-acting insulin at reduced dose 30 units  twice daily ( Home dose 6- a.m, 70 p.m) -Hold Metformin -Hold gabapentin for now with confusion  NASH liver cirrhosis, thrombocytopenia platelets 84, baseline over the past year 72-124.  Blood ammonia level at about 43.  Leukopenia- WBC 2.2, baseline 3.2 -4.9  Migraines - Resume topiramate  DVT prophylaxis: SCDs Code Status: Full code Family Communication: Spouse at bedside Disposition Plan:  1 - 2 days Consults called: Tele- Neurology Admission status:  Obs tele   Bethena Roys MD [facility]  07/27/2021, 5:51 PM

## 2021-07-27 NOTE — ED Notes (Signed)
Pt back from CT

## 2021-07-27 NOTE — ED Notes (Signed)
Pt transported to CT ?

## 2021-07-27 NOTE — ED Notes (Signed)
Returned to CT for CTA studies

## 2021-07-27 NOTE — ED Provider Notes (Signed)
[facility] EMERGENCY DEPARTMENT Provider Note   CSN: [id number] Arrival date & time: 07/27/21  1304  An emergency department physician performed an initial assessment on this suspected stroke patient at 1315.  History Chief complaint: Sudden onset confusion and weakness  [name] [name] is a 65 y.o. male.  HPI  Patient presents to the ED for evaluation of cute onset of weakness and feeling lightheaded.  Patient has a history of prior stroke with complete resolution of his symptoms and no residual deficits.  Patient went to work this morning.  He states he was feeling fine initially.  At about 11 AM or 11:30 AM he had sudden onset of left-sided facial numbness as well as weakness on the left side of his body.  He also has some numbness on the left side of his face.  The symptoms persist.  He denies any headache.  He does feel lightheaded and "loopy".  No known fevers or chills.  No vomiting or diarrhea.  Past Medical History:  Diagnosis Date   Anxiety    Arthritis    Asthma    Cataract    Right eye   Chronic lower back pain    Cirrhosis of liver ([place])    Coronary atherosclerosis of native coronary artery    a. LAD & OM stenting;  b. 2007 Cath: nonobs dzs, patent stents;  c. 07/2012 neg MV, EF 61%.  d. 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   LIMA to LAD, free RIMA to OM2, SVG to PDA, open SVG harvest from right thigh   Thrombocytopenia (HCC)    TIA (transient ischemic attack) ~ 2010   Type 2 diabetes mellitus [facility])     Patient Active Problem List   Diagnosis Date Noted   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) ([place]) 02/19/2020   Liver cirrhosis secondary  to NASH ([place]) 09/13/2019   GERD (gastroesophageal reflux disease) 09/13/2019   Dysphagia 09/13/2019   DDD (degenerative disc disease), cervical 04/13/2019   RUQ abdominal pain 11/28/2018   Calculus of gallbladder with acute cholecystitis without obstruction    Acute diastolic CHF (congestive heart failure) ([place]) 11/26/2018   Right upper quadrant abdominal pain 11/26/2018   Acute respiratory failure with hypoxia ([place]) 11/26/2018   Pancytopenia, acquired ([place])    Acute kidney injury ([place]) 11/20/2018   Aphasia 07/09/2018   Cerebral thrombosis with cerebral infarction 07/09/2018   Chest pain 08/10/2017   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   Coronary artery disease involving native coronary artery with unstable angina pectoris ([place])    CAD -S/P PCI LAD OM/LAD '13 and 2016 12/31/2015   Hyperlipidemia    Chest pain with high risk of acute coronary syndrome 12/23/2015   Precordial chest pain 12/03/2014   TIA (transient ischemic attack)    CAD- diffuse LAD disease at cath 12/30/15    Thrombocytopenia ([place])    Fever 12/04/2013   Accelerating angina ([place]) 12/03/2013   Type 2 diabetes mellitus (HCC)    Anxiety    PVC's (premature ventricular contractions) 01/06/2013   Obstructive  sleep apnea-declines C-pap 07/04/2012   Mixed hyperlipidemia 07/30/2009   Essential hypertension, benign 07/30/2009    Past Surgical History:  Procedure Laterality Date   ANTERIOR CERVICAL DECOMP/DISCECTOMY FUSION  1998   "C3-4"   CARDIAC CATHETERIZATION  "several"   CARDIAC CATHETERIZATION N/A 12/30/2015   Procedure: Left Heart Cath and Coronary Angiography;  Surgeon: [name], MD;  Location: [place] CV LAB;  Service: Cardiovascular;  Laterality: N/A;   CARPAL TUNNEL RELEASE Bilateral 2005   CHOLECYSTECTOMY N/A 11/28/2018   Procedure: LAPAROSCOPIC CHOLECYSTECTOMY;  Surgeon: [name], MD;  Location: [facility] ORS;  Service: General;  Laterality: N/A;   COLONOSCOPY     COLONOSCOPY WITH PROPOFOL N/A 09/27/2020    Procedure: COLONOSCOPY WITH PROPOFOL;  Surgeon: [name], MD;  Location: [facility] ENDO SUITE;  Service: Gastroenterology;  Laterality: N/A;   CORONARY ANGIOPLASTY     CORONARY ANGIOPLASTY WITH STENT PLACEMENT  2002; 2003; 11/20/2014   "I have 4 stents after today" (11/20/2014)   CORONARY ARTERY BYPASS GRAFT N/A 01/09/2016   Procedure: CORONARY ARTERY BYPASS GRAFTING (CABG) X 3 UTILIZING RIGHT AND LEFT INTERNAL MAMMARY ARTERY AND ENDOSCOPICALLY HARVESTED SAPHENEOUS VEIN.;  Surgeon: Rexene Alberts, MD;  Location: [place];  Service: Open Heart Surgery;  Laterality: N/A;   ESOPHAGOGASTRODUODENOSCOPY     ESOPHAGOGASTRODUODENOSCOPY (EGD) WITH PROPOFOL N/A 09/27/2020   Procedure: ESOPHAGOGASTRODUODENOSCOPY (EGD) WITH PROPOFOL;  Surgeon: [name], MD;  Location: [facility];  Service: Gastroenterology;  Laterality: N/A;  10   KNEE SURGERY Left 02/2012   "scraped; open"   LEFT HEART CATH AND CORS/GRAFTS ANGIOGRAPHY N/A 08/09/2017   Procedure: LEFT HEART CATH AND CORS/GRAFTS ANGIOGRAPHY;  Surgeon: [name], MD;  Location: [place] CV LAB;  Service: Cardiovascular;  Laterality: N/A;   LEFT HEART CATH AND CORS/GRAFTS ANGIOGRAPHY N/A 11/22/2018   Procedure: LEFT HEART CATH AND CORS/GRAFTS ANGIOGRAPHY;  Surgeon: [name], [name], MD;  Location: [place] CV LAB;  Service: Cardiovascular;  Laterality: N/A;   LEFT HEART CATHETERIZATION WITH CORONARY ANGIOGRAM N/A 07/20/2012   Procedure: LEFT HEART CATHETERIZATION WITH CORONARY ANGIOGRAM;  Surgeon: [name], MD;  Location: [place] CATH LAB;  Service: Cardiovascular;  Laterality: N/A;   LEFT HEART CATHETERIZATION WITH CORONARY ANGIOGRAM N/A 11/20/2014   Procedure: LEFT HEART CATHETERIZATION WITH CORONARY ANGIOGRAM;  Surgeon: [name], MD;  Location: [facility] CATH LAB;  Service: Cardiovascular;  Laterality: N/A;   LEFT HEART CATHETERIZATION WITH CORONARY ANGIOGRAM N/A 11/26/2014   Procedure: LEFT HEART CATHETERIZATION WITH  CORONARY ANGIOGRAM;  Surgeon: [name], MD;  Location: [facility] CATH LAB;  Service: Cardiovascular;  Laterality: N/A;   NEUROPLASTY / TRANSPOSITION ULNAR NERVE AT ELBOW Right ~ 2012   PERCUTANEOUS CORONARY ROTOBLATOR INTERVENTION (PCI-R)  11/20/2014   Procedure: PERCUTANEOUS CORONARY ROTOBLATOR INTERVENTION (PCI-R);  Surgeon: [name], MD;  Location: [facility] CATH LAB;  Service: Cardiovascular;;   SHOULDER ARTHROSCOPY Left ~ 2011   TEE WITHOUT CARDIOVERSION N/A 01/09/2016   Procedure: TRANSESOPHAGEAL ECHOCARDIOGRAM (TEE);  Surgeon: Rexene Alberts, MD;  Location: [place];  Service: Open Heart Surgery;  Laterality: N/A;       Family History  Problem Relation Age of Onset   Stroke Mother    Coronary artery disease Father 2   Asthma Sister    Multiple sclerosis Brother    Heart disease Paternal Uncle    Stroke Maternal Grandmother    Heart attack Paternal Grandmother    Cancer Paternal Grandfather    Asthma Sister  Seizures Son    Migraines Son    Autism Son    Migraines Son     Social History   Tobacco Use   Smoking status: Never   Smokeless tobacco: Never  Vaping Use   Vaping Use: Never used  Substance Use Topics   Alcohol use: No   Drug use: No    Home Medications Prior to Admission medications   Medication Sig Start Date End Date Taking? Authorizing Provider  albuterol (PROVENTIL HFA;VENTOLIN HFA) 108 (90 BASE) MCG/ACT inhaler Inhale 2 puffs into the lungs every 6 (six) hours as needed for wheezing or shortness of breath.    Yes [provider]  aspirin EC 81 MG tablet Take 81 mg by mouth at bedtime.    Yes [provider]  carvedilol (COREG) 3.125 MG tablet Take 1 tablet (3.125 mg total) by mouth 2 (two) times daily. 09/27/20 09/27/21 Yes [name], MD  cholecalciferol (VITAMIN D3) 25 MCG (1000 UNIT) tablet Take 1,000 Units by mouth daily.   Yes [provider]  citalopram (CELEXA) 20 MG tablet Take 20 mg by mouth at bedtime.     Yes [provider]  cyclobenzaprine (FLEXERIL) 10 MG tablet Take 10 mg by mouth 3 (three) times daily as needed for muscle spasms. 08/15/20  Yes [provider]  ezetimibe (ZETIA) 10 MG tablet Take 10 mg by mouth daily.   Yes [provider]  ferrous sulfate 325 (65 FE) MG EC tablet Take 325 mg by mouth once a week. Takes on Mondays.   Yes [provider]  fluticasone (FLONASE) 50 MCG/ACT nasal spray Place 1 spray into the nose 2 (two) times daily.    Yes [provider]  Fluticasone-Salmeterol (ADVAIR) 250-50 MCG/DOSE AEPB Inhale 1 puff into the lungs every 12 (twelve) hours.   Yes [provider]  furosemide (LASIX) 20 MG tablet Take 20 mg by mouth daily. 10/14/19  Yes [provider]  gabapentin (NEURONTIN) 300 MG capsule Take 300 mg by mouth 3 (three) times daily. 07/09/17  Yes [provider]  insulin glargine (LANTUS) 100 UNIT/ML Solostar Pen Inject 70 Units into the skin daily.   Yes [provider]  isosorbide mononitrate (IMDUR) 60 MG 24 hr tablet Take 1 tablet (60 mg total) by mouth daily. 05/22/21  Yes Verta Ellen., NP  Melatonin 5 MG TABS Take 10 mg by mouth at bedtime.   Yes [provider]  metFORMIN (GLUCOPHAGE) 1000 MG tablet Take 1 tablet (1,000 mg total) by mouth 2 (two) times daily. For the next 2 days as you did receive IV contrast, then resume on 11/26/2018 Patient taking differently: Take 1,000 mg by mouth 2 (two) times daily. 11/23/18  Yes Elgergawy, Silver Huguenin, MD  nitroGLYCERIN (NITROSTAT) 0.4 MG SL tablet Place 0.4 mg under the tongue every 5 (five) minutes x 3 doses as needed for chest pain (if no relief after 2nd dose, proceed to the ED for an evaluation or call 911).   Yes [provider]  omeprazole (PRILOSEC) 20 MG capsule Take 20 mg by mouth 2 (two) times daily. 04/18/20  Yes [provider]  pravastatin (PRAVACHOL) 20 MG tablet Take 20 mg by mouth daily. 07/01/21   Yes [provider]  topiramate (TOPAMAX) 50 MG tablet Take 50 mg by mouth at bedtime.  04/18/20  Yes [provider]  TRESIBA FLEXTOUCH 100 UNIT/ML SOPN FlexTouch Pen Inject 60-70 Units into the skin in the morning and at  bedtime. Inject 60 units in the AM and 70 units in the PM. 08/11/18  Yes [provider]  vitamin B-12 (CYANOCOBALAMIN) 1000 MCG tablet Take 1,000 mcg by mouth daily.    Yes [provider]  zinc gluconate 50 MG tablet Take 50 mg by mouth daily.   Yes [provider]  insulin aspart (NOVOLOG) 100 UNIT/ML FlexPen Inject 0-30 Units into the skin 3 (three) times daily as needed for high blood sugar (CBG >150). Per sliding scale    [provider]    Allergies    Divalproex sodium, Statins, Tramadol, Valproic acid, Other, Gadolinium derivatives, and Tricor [fenofibrate]  Review of Systems   Review of Systems  All other systems reviewed and are negative.  Physical Exam Updated Vital Signs BP (!) 153/83   Pulse 72   Temp 98 F (36.7 C) (Oral)   Resp 15   SpO2 97%   Physical Exam Vitals and nursing note reviewed.  Constitutional:      Appearance: He is well-developed. He is not diaphoretic.  HENT:     Head: Normocephalic and atraumatic.     Right Ear: External ear normal.     Left Ear: External ear normal.  Eyes:     General: No scleral icterus.       Right eye: No discharge.        Left eye: No discharge.     Conjunctiva/sclera: Conjunctivae normal.  Neck:     Trachea: No tracheal deviation.  Cardiovascular:     Rate and Rhythm: Normal rate and regular rhythm.  Pulmonary:     Effort: Pulmonary effort is normal. No respiratory distress.     Breath sounds: Normal breath sounds. No stridor. No wheezing or rales.  Abdominal:     General: Bowel sounds are normal. There is no distension.     Palpations: Abdomen is soft.     Tenderness: There is no abdominal tenderness. There is no guarding or rebound.   Musculoskeletal:        General: No tenderness or deformity.     Cervical back: Neck supple.  Skin:    General: Skin is warm and dry.     Findings: No rash.  Neurological:     General: No focal deficit present.     Mental Status: He is alert.     Cranial Nerves: No cranial nerve deficit (?mild left  facial droop, extraocular movements intact, no slurred speech).     Sensory: No sensory deficit.     Motor: Weakness present. No abnormal muscle tone or seizure activity.     Comments: Patient follows commands, is oriented to person place and time, difficulty holding both arms off the bed for 10 seconds, able to hold right leg off the bed without difficulty, unable to lift leg off the bed completely  Psychiatric:        Mood and Affect: Mood normal.    ED Results / Procedures / Treatments   Labs (all labs ordered are listed, but only abnormal results are displayed) Labs Reviewed  CBC - Abnormal; Notable for the following components:      Result Value   WBC 2.2 (*)    Platelets 84 (*)    All other components within normal limits  DIFFERENTIAL - Abnormal; Notable for the following components:   Neutro Abs 1.1 (*)    All other components within normal limits  COMPREHENSIVE METABOLIC PANEL - Abnormal; Notable for the following components:   Glucose, Bld 138 (*)  Calcium 8.5 (*)    All other components within normal limits  AMMONIA - Abnormal; Notable for the following components:   Ammonia 43 (*)    All other components within normal limits  CBG MONITORING, ED - Abnormal; Notable for the following components:   Glucose-Capillary 153 (*)    All other components within normal limits  I-STAT CHEM 8, ED - Abnormal; Notable for the following components:   Glucose, Bld 136 (*)    All other components within normal limits  RESP PANEL BY RT-PCR (FLU A&B, COVID) ARPGX2  ETHANOL  PROTIME-INR  APTT  RAPID URINE DRUG SCREEN, HOSP PERFORMED  URINALYSIS, ROUTINE W REFLEX MICROSCOPIC     EKG EKG Interpretation  Date/Time:  Sunday July 27 2021 13:16:16 EDT Ventricular Rate:  69 PR Interval:  145 QRS Duration: 95 QT Interval:  416 QTC Calculation: 446 R Axis:   35 Text Interpretation: Sinus rhythm No significant change since last tracing Confirmed by Dorie Rank [phone]) on 07/27/2021 1:22:37 PM  Radiology CT HEAD CODE STROKE WO CONTRAST  Result Date: 07/27/2021 CLINICAL DATA:  Code stroke.  Left-sided weakness EXAM: CT HEAD WITHOUT CONTRAST TECHNIQUE: Contiguous axial images were obtained from the base of the skull through the vertex without intravenous contrast. COMPARISON:  Brain MRI 02/20/2020, CT head 03/06/2020 FINDINGS: Brain: There is no acute intracranial hemorrhage, extra-axial fluid collection, or acute infarct. There is a small remote lacunar infarct in the left caudate head. There is mild parenchymal volume loss. The ventricles are not enlarged. There is no mass lesion. There is no midline shift. Vascular: There is calcification of the bilateral cavernous ICAs. There is no hyperdense vessel. Skull: Normal. Negative for fracture or focal lesion. Sinuses/Orbits: The paranasal sinuses are clear. Bilateral lens implants are in place. The globes and orbits are otherwise unremarkable. Other: A subcentimeter nodule in the left parietal scalp may reflect a trichilemmal cyst. A few other areas of soft tissue thickening in the scalp are nonspecific. ASPECTS [facility] Stroke Program Early CT Score) - Ganglionic level infarction (caudate, lentiform nuclei, internal capsule, insula, M1-M3 cortex): 7 - Supraganglionic infarction (M4-M6 cortex): 3 Total score (0-10 with 10 being normal): 10 IMPRESSION: 1. No acute intracranial hemorrhage or infarct. 2. ASPECTS is 10 These results were called by telephone at the time of interpretation on 07/27/2021 at 1:35 pm to provider [facility] , who verbally acknowledged these results. Electronically Signed   By: Valetta Mole M.D.   On:  07/27/2021 13:38    Procedures Procedures   Medications Ordered in ED Medications  sodium chloride 0.9 % bolus 500 mL (0 mLs Intravenous Stopped 07/27/21 1513)    Followed by  0.9 %  sodium chloride infusion (100 mL/hr Intravenous New Bag/Given 07/27/21 1513)  iohexol (OMNIPAQUE) 350 MG/ML injection 100 mL (100 mLs Intravenous Contrast Given 07/27/21 1428)    ED Course  I have reviewed the triage vital signs and the nursing notes.  Pertinent labs & imaging results that were available during my care of the patient were reviewed by me and considered in my medical decision making (see chart for details).  Clinical Course as of 07/28/21 3267  Sun Jul 27, 2021  1320 Presentation concerning for the possibility of acute stroke .  Will activate code stroke.   [TI]  4580 Labs reviewed.  No significant abnormalities.  No electrolyte abnormalities to account for the mental status changes. [DX]  8338 White blood cell count is decreased and the patient does have thrombocytopenia but this  has been noted on previous values. [MR]  6151 Does have history of liver disease.  Ammonia level slightly elevated but doubt presentation is related to hepatic encephalopathy. [ID]  4373 CT scans without acute findings [JK]  1533 Case discussed with Dr. Quinn Axe.  Okay to admit to [place] for TIA stroke work-up [JK]    Clinical Course User Index [JK] Dorie Rank, MD   MDM Rules/Calculators/A&P                           Patient presented with complaints of weakness concerning for the possibility of stroke or TIA.  Patient does have history of prior stroke.  Patient's CT scans did not show any signs of acute hemorrhage.  Code stroke was activated and neurology consult was obtained after my initial assessment.  Patient was seen by Dr. Quinn Axe.  Appreciate her assistance.  Patient not felt to be a tPA candidate.  No signs of acute LVO.  Doubt symptoms are related to hepatic encephalopathy at this time.   We will plan on admission for TIA /Stroke evaluation Final Clinical Impression(s) / ED Diagnoses Final diagnoses:  Confusion  Weakness     Dorie Rank, MD 07/28/21 [phone]

## 2021-07-27 NOTE — ED Notes (Addendum)
Wife at bedside, made aware of bed assignment and transported to unit.

## 2021-07-27 NOTE — ED Notes (Signed)
Per teleneuro, not a candidate for TpA

## 2021-07-27 NOTE — ED Notes (Signed)
Attempt to call report to unit.   Nurse declined report at this time per Charge nurse due to staff call out.

## 2021-07-27 NOTE — ED Triage Notes (Signed)
Per family pt states began feeling "loopy" around 11;30, reports facial numbness left side, Able to state name, date of birth, follow commands,  Dr Tomi Bamberger at bedside

## 2021-07-27 NOTE — ED Notes (Signed)
ED Provider at bedside. 

## 2021-07-28 ENCOUNTER — Encounter (HOSPITAL_COMMUNITY): Payer: Self-pay | Admitting: Internal Medicine

## 2021-07-28 ENCOUNTER — Observation Stay (HOSPITAL_COMMUNITY): Payer: Medicare Other

## 2021-07-28 ENCOUNTER — Observation Stay (HOSPITAL_BASED_OUTPATIENT_CLINIC_OR_DEPARTMENT_OTHER): Payer: Medicare Other

## 2021-07-28 DIAGNOSIS — E785 Hyperlipidemia, unspecified: Secondary | ICD-10-CM

## 2021-07-28 DIAGNOSIS — I10 Essential (primary) hypertension: Secondary | ICD-10-CM | POA: Diagnosis not present

## 2021-07-28 DIAGNOSIS — D696 Thrombocytopenia, unspecified: Secondary | ICD-10-CM | POA: Diagnosis not present

## 2021-07-28 DIAGNOSIS — I6389 Other cerebral infarction: Secondary | ICD-10-CM

## 2021-07-28 DIAGNOSIS — K7581 Nonalcoholic steatohepatitis (NASH): Secondary | ICD-10-CM | POA: Diagnosis not present

## 2021-07-28 DIAGNOSIS — G459 Transient cerebral ischemic attack, unspecified: Secondary | ICD-10-CM

## 2021-07-28 LAB — CBC
HCT: 39.5 % (ref 39.0–52.0)
Hemoglobin: 13.9 g/dL (ref 13.0–17.0)
MCH: 32.2 pg (ref 26.0–34.0)
MCHC: 35.2 g/dL (ref 30.0–36.0)
MCV: 91.4 fL (ref 80.0–100.0)
Platelets: 76 10*3/uL — ABNORMAL LOW (ref 150–400)
RBC: 4.32 MIL/uL (ref 4.22–5.81)
RDW: 12.7 % (ref 11.5–15.5)
WBC: 2.3 10*3/uL — ABNORMAL LOW (ref 4.0–10.5)
nRBC: 0 % (ref 0.0–0.2)

## 2021-07-28 LAB — ECHOCARDIOGRAM COMPLETE
AR max vel: 2.07 cm2
AV Area VTI: 2.27 cm2
AV Area mean vel: 2.12 cm2
AV Mean grad: 4 mmHg
AV Peak grad: 6.7 mmHg
Ao pk vel: 1.29 m/s
Area-P 1/2: 4.77 cm2
MV VTI: 2.41 cm2
P 1/2 time: 396 msec
S' Lateral: 2.8 cm

## 2021-07-28 LAB — BASIC METABOLIC PANEL
Anion gap: 6 (ref 5–15)
BUN: 13 mg/dL (ref 8–23)
CO2: 25 mmol/L (ref 22–32)
Calcium: 8.4 mg/dL — ABNORMAL LOW (ref 8.9–10.3)
Chloride: 108 mmol/L (ref 98–111)
Creatinine, Ser: 1.04 mg/dL (ref 0.61–1.24)
GFR, Estimated: >60 mL/min (ref >60)
Glucose, Bld: 94 mg/dL (ref 70–99)
Potassium: 4 mmol/L (ref 3.5–5.1)
Sodium: 139 mmol/L (ref 135–145)

## 2021-07-28 LAB — LIPID PANEL
Cholesterol: 183 mg/dL (ref 0–200)
HDL: 27 mg/dL — ABNORMAL LOW (ref >40)
LDL Cholesterol: 115 mg/dL — ABNORMAL HIGH (ref 0–99)
Total CHOL/HDL Ratio: 6.8 RATIO
Triglycerides: 207 mg/dL — ABNORMAL HIGH (ref <150)
VLDL: 41 mg/dL — ABNORMAL HIGH (ref 0–40)

## 2021-07-28 LAB — HEMOGLOBIN A1C
Hgb A1c MFr Bld: 5.9 % — ABNORMAL HIGH (ref 4.8–5.6)
Mean Plasma Glucose: 122.63 mg/dL

## 2021-07-28 LAB — GLUCOSE, CAPILLARY
Glucose-Capillary: 98 mg/dL (ref 70–99)
Glucose-Capillary: 216 mg/dL — ABNORMAL HIGH (ref 70–99)
Glucose-Capillary: 99 mg/dL (ref 70–99)

## 2021-07-28 LAB — HIV ANTIBODY (ROUTINE TESTING W REFLEX): HIV Screen 4th Generation wRfx: NONREACTIVE

## 2021-07-28 MED ORDER — TRESIBA FLEXTOUCH 100 UNIT/ML ~~LOC~~ SOPN: 65.0000 [IU] | PEN_INJECTOR | Freq: Two times a day (BID) | SUBCUTANEOUS | Status: DC

## 2021-07-28 MED ORDER — ASPIRIN EC 81 MG PO TBEC: 162.0000 mg | DELAYED_RELEASE_TABLET | Freq: Every day | ORAL | 4 refills | Status: DC

## 2021-07-28 MED ORDER — PRAVASTATIN SODIUM 20 MG PO TABS: 40.0000 mg | ORAL_TABLET | Freq: Every day | ORAL | 3 refills | Status: DC

## 2021-07-28 NOTE — Progress Notes (Signed)
*  PRELIMINARY RESULTS* Echocardiogram 2D Echocardiogram has been performed.  [name] [name] 07/28/2021, 12:59 PM

## 2021-07-28 NOTE — Care Management Obs Status (Signed)
[place] NOTIFICATION   Patient Details  Name: [name] [name] MRN: [id number] Date of Birth: Jan 01, 1956   Medicare Observation Status Notification Given:  Yes    Tommy Medal 07/28/2021, 3:37 PM

## 2021-07-28 NOTE — Discharge Summary (Signed)
Physician Discharge Summary  [name] [name] ZGY:[id number] DOB: 1956/04/16 DOA: 07/27/2021  PCP: Dorothyann Peng, FNP  Admit date: 07/27/2021 Discharge date: 07/28/2021  Time spent: 35 minutes  Recommendations for Outpatient Follow-up:  Reassessed blood pressure and adjust antihypertensive regimen as needed. Repeat basic metabolic panel to follow to lites and renal function. Make sure patient follow-up with neurology service as instructed.   Discharge Diagnoses:    Stroke-like symptoms/TIA   Essential hypertension, benign   Type 2 diabetes mellitus (HCC)   Thrombocytopenia (HCC)   Hyperlipidemia   S/P CABG x 3   Liver cirrhosis secondary to NASH [facility])   Discharge Condition: Stable and improved.  Discharged home with instruction to follow-up with PCP in 10 days and to follow-up with neurology in 4 weeks.  CODE STATUS: Full code.  Diet recommendation: Heart healthy modified carbohydrate diet.  Filed Weights   07/28/21 1540  Weight: 104.4 kg    History of present illness:  As per H&P written by Dr. Denton Brick on 07/27/2021 [name] [name] is a 65 y.o. male with medical history significant for  CABG, prior stroke, CHF, [name] liver cirrhosis, diabetes mellitus. Patient was brought to the ED reports of confusion, facial numbness, left-sided weakness and light headedness. Spouse is at bedside and assist with the history, the time of my evaluation patient is awake, appears slightly confused and tells me does not remember a lot of what happened. Patient works at [employer], he mixes the dough.  While at work, he started having dizziness, does not know exactly when the dizziness started. At about 11, patient became agitated and was arguing with his manager because he was not allowed to go on break to eat.  About 12 spouse called to patient's place of work, and noted that he was dragging his left side.  He was also confused.  She immediately brought patient to the ED.   Patient had a stroke in  2019, where he presented at [facility] and got tPA, at that time he had weakness involving 1 side(spouse unsure which), after that stroke he had no residual deficits.   Patient has been compliant with his aspirin.  Ports good oral intake continue today.  No vomiting no loose stools no cough no difficulty breathing no pain with urination.  At the time of my evaluation, he is awake, selectively answering questions, reports some improvement in his symptoms since onset, but is mostly complaining of pain in his left upper and lower extremity.   ED Course: Systolic 591M.  WBC 2.2, platelets 84.  Ammonia level 43.  INR 1.1.  Blood alcohol level less than 10.  Code stroke was called, head CT was without acute abnormality.  CTA head and neck without large vessel occlusion. Telemetry neurologist recommended admission at [facility] for stroke work-up.  Hospital Course:  1-TIA -CT scan and MRI demonstrating no acute intracranial abnormalities or infarcts. -Sinus rhythm on telemetry -Continue risk factor modification with adjusted dose of aspirin, statins and blood pressure control. -Heart healthy diet and outpatient follow-up with neurology service has been recommended.  2-hypertension/history of coronary disease a status post CABG -Continue the use of Coreg, Imdur and Lasix. -Advised to follow heart healthy diet -Patient denies chest pain or shortness of breath -Outpatient follow-up with cardiology service recommended.  3-type 2 diabetes mellitus with neuropathy -Well-controlled (A1c 5.9) -Resume home hypoglycemic regimen and the use of gabapentin.  4-history of migraine -Resume the use of Topamax.  5-NASH liver cirrhosis/thrombocytopenia -Platelets at baseline -Normal ammonia  level -Continue follow-up with gastroenterology as an outpatient.  Procedures: See below for x-ray reports. 2-D echo  Consultations: Tele-neurology  Discharge Exam: Vitals:   07/28/21 1440 07/28/21 1538  BP:  (!) 186/90 (!) 186/90  Pulse: 75 75  Resp: 20 20  Temp: 97.8 F (36.6 C) 97.8 F (36.6 C)  SpO2: 98%     General: Afebrile, no chest pain, no nausea, no vomiting.  Oriented x3 and without complaints of neurologic deficits. Cardiovascular: S1 and S2, no rubs, no gallops, no JVD. Respiratory: Good air movement bilaterally; no using accessory muscles.  Good saturation on room air. Abdomen: Soft, nontender, nondistended, positive bowel sounds Extremities: No cyanosis or clubbing.  Discharge Instructions   Discharge Instructions     Diet - low sodium heart healthy   Complete by: As directed    Discharge instructions   Complete by: As directed    Take medications as prescribed Follow heart healthy/modified carbohydrate diet Arrange follow-up with PCP Follow-up with neurology (Dr. [name]) in 4 weeks. Maintain adequate hydration.      Allergies as of 07/28/2021       Reactions   Divalproex Sodium Other (See Comments)   Causes anger   Statins Other (See Comments)   Muscle aches and cramps   Tramadol Other (See Comments)   Chest pain    Valproic Acid Other (See Comments)   Causes anger   Other Other (See Comments)   Other reaction(s): Unknown   Gadolinium Derivatives Nausea And Vomiting   07/25/19 Pt vomited immediately after IV gad. Denies itching, dyspnea.  (Adverse, not allergic, reaction   Tricor [fenofibrate] Other (See Comments)   Leg cramps        Medication List     STOP taking these medications    insulin glargine 100 UNIT/ML Solostar Pen Commonly known as: LANTUS       TAKE these medications    albuterol 108 (90 Base) MCG/ACT inhaler Commonly known as: VENTOLIN HFA Inhale 2 puffs into the lungs every 6 (six) hours as needed for wheezing or shortness of breath.   aspirin EC 81 MG tablet Take 2 tablets (162 mg total) by mouth at bedtime. What changed: how much to take   carvedilol 3.125 MG tablet Commonly known as: Coreg Take 1 tablet (3.125  mg total) by mouth 2 (two) times daily.   cholecalciferol 25 MCG (1000 UNIT) tablet Commonly known as: VITAMIN D3 Take 1,000 Units by mouth daily.   citalopram 20 MG tablet Commonly known as: CELEXA Take 20 mg by mouth at bedtime.   cyclobenzaprine 10 MG tablet Commonly known as: FLEXERIL Take 10 mg by mouth 3 (three) times daily as needed for muscle spasms.   ezetimibe 10 MG tablet Commonly known as: ZETIA Take 10 mg by mouth daily.   ferrous sulfate 325 (65 FE) MG EC tablet Take 325 mg by mouth once a week. Takes on Mondays.   fluticasone 50 MCG/ACT nasal spray Commonly known as: FLONASE Place 1 spray into the nose 2 (two) times daily.   Fluticasone-Salmeterol 250-50 MCG/DOSE Aepb Commonly known as: ADVAIR Inhale 1 puff into the lungs every 12 (twelve) hours.   furosemide 20 MG tablet Commonly known as: LASIX Take 20 mg by mouth daily.   gabapentin 300 MG capsule Commonly known as: NEURONTIN Take 300 mg by mouth 3 (three) times daily.   insulin aspart 100 UNIT/ML FlexPen Commonly known as: NOVOLOG Inject 0-30 Units into the skin 3 (three) times daily as needed for high  blood sugar (CBG >150). Per sliding scale   isosorbide mononitrate 60 MG 24 hr tablet Commonly known as: IMDUR Take 1 tablet (60 mg total) by mouth daily.   melatonin 5 MG Tabs Take 10 mg by mouth at bedtime.   metFORMIN 1000 MG tablet Commonly known as: GLUCOPHAGE Take 1 tablet (1,000 mg total) by mouth 2 (two) times daily. For the next 2 days as you did receive IV contrast, then resume on 11/26/2018 What changed: additional instructions   nitroGLYCERIN 0.4 MG SL tablet Commonly known as: NITROSTAT Place 0.4 mg under the tongue every 5 (five) minutes x 3 doses as needed for chest pain (if no relief after 2nd dose, proceed to the ED for an evaluation or call 911).   omeprazole 20 MG capsule Commonly known as: PRILOSEC Take 20 mg by mouth 2 (two) times daily.   pravastatin 20 MG  tablet Commonly known as: PRAVACHOL Take 2 tablets (40 mg total) by mouth daily. What changed: how much to take   topiramate 50 MG tablet Commonly known as: TOPAMAX Take 50 mg by mouth at bedtime.   [name] FlexTouch 100 UNIT/ML FlexTouch Pen Generic drug: insulin degludec Inject 65 Units into the skin in the morning and at bedtime. Inject 60 units in the AM and 70 units in the PM. What changed: how much to take   vitamin B-12 1000 MCG tablet Commonly known as: CYANOCOBALAMIN Take 1,000 mcg by mouth daily.   zinc gluconate 50 MG tablet Take 50 mg by mouth daily.       Allergies  Allergen Reactions   Divalproex Sodium Other (See Comments)    Causes anger   Statins Other (See Comments)    Muscle aches and cramps   Tramadol Other (See Comments)    Chest pain    Valproic Acid Other (See Comments)    Causes anger   Other Other (See Comments)    Other reaction(s): Unknown   Gadolinium Derivatives Nausea And Vomiting    07/25/19 Pt vomited immediately after IV gad. Denies itching, dyspnea.  (Adverse, not allergic, reaction   Tricor [Fenofibrate] Other (See Comments)    Leg cramps    Follow-up Information     Dorothyann Peng, FNP. Schedule an appointment as soon as possible for a visit in 10 day(s).   Specialty: Family Medicine Contact information: [address] [place] [address] [phone]         [name], MD .   Specialty: Cardiology Contact information: [place] [address] [phone]         Phillips Odor, MD. Schedule an appointment as soon as possible for a visit in 4 week(s).   Specialty: Neurology Contact information: [place] [address] [phone]                 The results of significant diagnostics from this hospitalization (including imaging, microbiology, ancillary and laboratory) are listed below for reference.    Significant Diagnostic Studies: MR BRAIN WO CONTRAST  Result  Date: 07/28/2021 CLINICAL DATA:  Dizziness, non-specific confusion, left side weakness and paraesthesias, dizziness EXAM: MRI HEAD WITHOUT CONTRAST TECHNIQUE: Multiplanar, multiecho pulse sequences of the brain and surrounding structures were obtained without intravenous contrast. COMPARISON:  CT Head 07/27/2021.  MRI head February 20, 2020. FINDINGS: Brain: No acute infarction, hemorrhage, hydrocephalus, extra-axial collection or mass lesion. Mild for age T2 hyperintensities within the white matter, nonspecific but compatible with chronic microvascular ischemic disease. Vascular: Major  arterial flow voids are maintained at the skull base. Skull and upper cervical spine: Normal marrow signal. Sinuses/Orbits: Mild paranasal sinus mucosal thickening. Other: Trace mastoid effusions. IMPRESSION: No evidence of acute intracranial abnormality. Specifically, no acute infarct. Electronically Signed   By: Margaretha Sheffield M.D.   On: 07/28/2021 10:43   CT HEAD CODE STROKE WO CONTRAST  Result Date: 07/27/2021 CLINICAL DATA:  Code stroke.  Left-sided weakness EXAM: CT HEAD WITHOUT CONTRAST TECHNIQUE: Contiguous axial images were obtained from the base of the skull through the vertex without intravenous contrast. COMPARISON:  Brain MRI 02/20/2020, CT head 03/06/2020 FINDINGS: Brain: There is no acute intracranial hemorrhage, extra-axial fluid collection, or acute infarct. There is a small remote lacunar infarct in the left caudate head. There is mild parenchymal volume loss. The ventricles are not enlarged. There is no mass lesion. There is no midline shift. Vascular: There is calcification of the bilateral cavernous ICAs. There is no hyperdense vessel. Skull: Normal. Negative for fracture or focal lesion. Sinuses/Orbits: The paranasal sinuses are clear. Bilateral lens implants are in place. The globes and orbits are otherwise unremarkable. Other: A subcentimeter nodule in the left parietal scalp may reflect a trichilemmal  cyst. A few other areas of soft tissue thickening in the scalp are nonspecific. ASPECTS [facility] Stroke Program Early CT Score) - Ganglionic level infarction (caudate, lentiform nuclei, internal capsule, insula, M1-M3 cortex): 7 - Supraganglionic infarction (M4-M6 cortex): 3 Total score (0-10 with 10 being normal): 10 IMPRESSION: 1. No acute intracranial hemorrhage or infarct. 2. ASPECTS is 10 These results were called by telephone at the time of interpretation on 07/27/2021 at 1:35 pm to provider [facility] , who verbally acknowledged these results. Electronically Signed   By: Valetta Mole M.D.   On: 07/27/2021 13:38   VAS US CAROTID  Result Date: 07/11/2021 Carotid Arterial Duplex Study Patient Name:  RAYN ENDERSON  Date of Exam:   07/10/2021 Medical Rec #: [id number]        Accession #:    [id number] Date of Birth: Jul 08, 1956        Patient Gender: M Patient Age:   10 years Exam Location:  [place] Procedure:      VAS US CAROTID Referring Phys: Delorse Lek. --------------------------------------------------------------------------------  Indications:       TIA, Carotid artery disease and lightheaded/dizzy. Risk Factors:      Hypertension, hyperlipidemia, Diabetes, no history of                    smoking, coronary artery disease. Other Factors:     S/p CABG with LIMA and RIMA. Comparison Study:  07/10/18 showed calcified shadowing plaque in the ICA's with                    normal velocities. Performing Technologist: [employer] BS, RVT, RDCS  Examination Guidelines: A complete evaluation includes B-mode imaging, spectral Doppler, color Doppler, and power Doppler as needed of all accessible portions of each vessel. Bilateral testing is considered an integral part of a complete examination. Limited examinations for reoccurring indications may be performed as noted.  Right Carotid Findings: +----------+--------+--------+--------+--------------------------+---------+           PSV cm/sEDV cm/sStenosisPlaque  Description        Comments  +----------+--------+--------+--------+--------------------------+---------+ CCA Prox  58      0                                                   +----------+--------+--------+--------+--------------------------+---------+  CCA Mid   112     10              irregular and heterogenous          +----------+--------+--------+--------+--------------------------+---------+ CCA Distal71      12                                                  +----------+--------+--------+--------+--------------------------+---------+ ICA Prox  67      9       1-39%   calcific                  Shadowing +----------+--------+--------+--------+--------------------------+---------+ ICA Mid   73      17              smooth                              +----------+--------+--------+--------+--------------------------+---------+ ICA Distal104     20                                                  +----------+--------+--------+--------+--------------------------+---------+ ECA       64      0                                                   +----------+--------+--------+--------+--------------------------+---------+ +----------+--------+-------+----------------+-------------------+           PSV cm/sEDV cmsDescribe        Arm Pressure (mmHG) +----------+--------+-------+----------------+-------------------+ Subclavian202     0      Multiphasic, [id number]                 +----------+--------+-------+----------------+-------------------+ +---------+--------+--+--------+-+---------+ VertebralPSV cm/s44EDV cm/s0Antegrade +---------+--------+--+--------+-+---------+  Left Carotid Findings: +----------+--------+--------+--------+------------------+------------------+           PSV cm/sEDV cm/sStenosisPlaque DescriptionComments           +----------+--------+--------+--------+------------------+------------------+ CCA Prox  141     0                                                     +----------+--------+--------+--------+------------------+------------------+ CCA Distal73      11                                intimal thickening +----------+--------+--------+--------+------------------+------------------+ ICA Prox  95      13      1-39%   calcific          Shadowing          +----------+--------+--------+--------+------------------+------------------+ ICA Mid   165     18                                                   +----------+--------+--------+--------+------------------+------------------+  ICA Distal64      16                                                   +----------+--------+--------+--------+------------------+------------------+ ECA       211     0       >50%                                         +----------+--------+--------+--------+------------------+------------------+ +----------+--------+--------+----------------+-------------------+           PSV cm/sEDV cm/sDescribe        Arm Pressure (mmHG) +----------+--------+--------+----------------+-------------------+ [id number]      0       Multiphasic, [id number]                 +----------+--------+--------+----------------+-------------------+ +---------+--------+--+--------+--+---------+ VertebralPSV cm/s46EDV cm/s10Antegrade +---------+--------+--+--------+--+---------+   Summary: Right Carotid: Velocities in the right ICA are consistent with a 1-39% stenosis.                Non-hemodynamically significant plaque <50% noted in the CCA. Left Carotid: Velocities in the left ICA are consistent with a 1-39% stenosis.               The ECA appears >50% stenosed.  *See table(s) above for measurements and observations.  Electronically signed by Larae Grooms MD on 07/11/2021 at 10:20:25 AM.    Final    CT ANGIO HEAD NECK W WO CM W PERF (CODE STROKE)  Result Date: 07/27/2021 CLINICAL DATA:  Altered mental status around 11 30 in the  morning, facial numbness on the left EXAM: CT ANGIOGRAPHY HEAD AND NECK CT PERFUSION BRAIN TECHNIQUE: Multidetector CT imaging of the head and neck was performed using the standard protocol during bolus administration of intravenous contrast. Multiplanar CT image reconstructions and MIPs were obtained to evaluate the vascular anatomy. Carotid stenosis measurements (when applicable) are obtained utilizing NASCET criteria, using the distal internal carotid diameter as the denominator. Multiphase CT imaging of the brain was performed following IV bolus contrast injection. Subsequent parametric perfusion maps were calculated using RAPID software. CONTRAST:  182m OMNIPAQUE IOHEXOL 350 MG/ML SOLN COMPARISON:  Same-day noncontrast CT head: Brain MRI 02/20/2020, CT head 03/06/2020 FINDINGS: CTA NECK FINDINGS Aortic arch: There is mild calcified atherosclerotic plaque of the aortic arch. There is a common origin of the right brachiocephalic and left common carotid arteries, a normal variant. The aortic arch is otherwise unremarkable. Right carotid system: There is mild calcified atherosclerotic plaque of the right common carotid artery without hemodynamically significant stenosis. There is calcified atherosclerotic plaque of the proximal right internal carotid artery at the carotid bulb and just distally resulting in up to approximally 20% stenosis. The distal right internal carotid artery is patent. There is no evidence of dissection or aneurysm. Left carotid system: The left common carotid artery is patent. There is bulky calcified atherosclerotic plaque of the proximal left internal carotid artery resulting in approximately 40% stenosis. The distal left internal carotid artery is patent. There is moderate stenosis of the proximal left external carotid artery just after the bifurcation. There is no evidence of dissection or aneurysm. Vertebral arteries: There is calcified atherosclerotic plaque of the proximal left  vertebral artery probably resulting in at moderate to severe stenosis (measurement of  luminal diameter is difficult due to blooming from the calcium). There is focal calcified atherosclerotic plaque at the right V3/V4 junction resulting in mild to moderate stenosis. The right vertebral artery is otherwise patent. There is no evidence of dissection. There is dense calcified atherosclerotic plaque in the proximal left vertebral artery resulting in mild stenosis at the origin. The left vertebral artery is otherwise patent. There is no evidence of dissection. Skeleton: There is fusion of the C3 and C4 vertebral bodies. There is no acute osseous abnormality or aggressive osseous lesion. Other neck: Multiple dental caries are noted. Right palatine tonsilliths are noted. The soft tissues are unremarkable. Upper chest: Postsurgical changes reflecting CABG are seen. The lung apices are unremarkable. Review of the MIP images confirms the above findings CTA HEAD FINDINGS Anterior circulation: There is mild calcification of the bilateral cavernous ICAs without hemodynamically significant stenosis. The bilateral MCAs and ACAs are patent. Posterior circulation: The left V4 segment is larger than the right, a normal variant. The V4 segments and basilar artery are patent. The bilateral PCAs are patent. Venous sinuses: As permitted by contrast timing, patent. Anatomic variants: None. Review of the MIP images confirms the above findings CT Brain Perfusion Findings: ASPECTS: 10 CBF (<30%) Volume: 62m Perfusion (Tmax>6.0s) volume: 025mIMPRESSION: 1. No emergent large vessel occlusion in the head or neck. 2. No infarct core or penumbra identified. 3. Calcified atherosclerotic plaque at the bilateral carotid bulbs resulting in up to approximately 20% stenosis on the right and 40% stenosis on the left. 4. Calcified atherosclerotic plaque in the proximal vertebral arteries bilaterally resulting in mild stenosis on the left and moderate to  severe stenosis on the right. Focal atherosclerotic plaque at the right V3/V4 junction also results in mild to moderate stenosis. 5. Moderate stenosis of the proximal left external carotid artery just after the bifurcation. Electronically Signed   By: PeValetta Mole.D.   On: 07/27/2021 15:03    Microbiology: Recent Results (from the past 240 hour(s))  Resp Panel by RT-PCR (Flu A&B, Covid) Nasopharyngeal Swab     Status: None   Collection Time: 07/27/21  1:55 PM   Specimen: Nasopharyngeal Swab; Nasopharyngeal(NP) swabs in vial transport medium  Result Value Ref Range Status   SARS Coronavirus 2 by RT PCR NEGATIVE NEGATIVE Final    Comment: (NOTE) SARS-CoV-2 target nucleic acids are NOT DETECTED.  The SARS-CoV-2 RNA is generally detectable in upper respiratory specimens during the acute phase of infection. The lowest concentration of SARS-CoV-2 viral copies this assay can detect is 138 copies/mL. A negative result does not preclude SARS-Cov-2 infection and should not be used as the sole basis for treatment or other patient management decisions. A negative result may occur with  improper specimen collection/handling, submission of specimen other than nasopharyngeal swab, presence of viral mutation(s) within the areas targeted by this assay, and inadequate number of viral copies(<138 copies/mL). A negative result must be combined with clinical observations, patient history, and epidemiological information. The expected result is Negative.  Fact Sheet for Patients:  htEntrepreneurPulse.com.auFact Sheet for Healthcare Providers:  htIncredibleEmployment.beThis test is no t yet approved or cleared by the [place] and  has been authorized for detection and/or diagnosis of SARS-CoV-2 by FDA under an Emergency Use Authorization (EUA). This EUA will remain  in effect (meaning this test can be used) for the duration of the COVID-19 declaration under  Section 564(b)(1) of the Act, 21 U.S.C.section 360bbb-3(b)(1), unless the authorization is terminated  or revoked sooner.       Influenza A by PCR NEGATIVE NEGATIVE Final   Influenza B by PCR NEGATIVE NEGATIVE Final    Comment: (NOTE) The Xpert Xpress SARS-CoV-2/FLU/RSV plus assay is intended as an aid in the diagnosis of influenza from Nasopharyngeal swab specimens and should not be used as a sole basis for treatment. Nasal washings and aspirates are unacceptable for Xpert Xpress SARS-CoV-2/FLU/RSV testing.  Fact Sheet for Patients: EntrepreneurPulse.com.au  Fact Sheet for Healthcare Providers: IncredibleEmployment.be  This test is not yet approved or cleared by the [place] FDA and has been authorized for detection and/or diagnosis of SARS-CoV-2 by FDA under an Emergency Use Authorization (EUA). This EUA will remain in effect (meaning this test can be used) for the duration of the COVID-19 declaration under Section 564(b)(1) of the Act, 21 U.S.C. section 360bbb-3(b)(1), unless the authorization is terminated or revoked.  Performed at [facility], [address] [place], [place] [address]      Labs: Basic Metabolic Panel: Recent Labs  Lab 07/27/21 1316 07/27/21 1322 07/28/21 0531  NA 138 142 139  K 4.0 4.1 4.0  CL 109 108 108  CO2 23  --  25  GLUCOSE 138* 136* 94  BUN 17 16 13   CREATININE 1.09 1.10 1.04  CALCIUM 8.5*  --  8.4*   Liver Function Tests: Recent Labs  Lab 07/27/21 1316  AST 36  ALT 34  ALKPHOS 51  BILITOT 0.7  PROT 7.1  ALBUMIN 3.8    Recent Labs  Lab 07/27/21 1316  AMMONIA 43*   CBC: Recent Labs  Lab 07/27/21 1316 07/27/21 1322 07/28/21 0531  WBC 2.2*  --  2.3*  NEUTROABS 1.1*  --   --   HGB 14.0 13.3 13.9  HCT 40.6 39.0 39.5  MCV 93.1  --  91.4  PLT 84*  --  76*    CBG: Recent Labs  Lab 07/27/21 1311 07/27/21 1851 07/27/21 2019 07/28/21 0746 07/28/21 1123  GLUCAP 153* 108*  140* 98 216*     Signed:  Barton Dubois MD.  [facility] 07/28/2021, 3:55 PM

## 2021-07-28 NOTE — Evaluation (Signed)
Physical Therapy Evaluation Patient Details Name: [name] [name] MRN: [id number] DOB: 1956-06-06 Today's Date: 07/28/2021  History of Present Illness  MATEEN FRANSSEN is a 65 y.o. male with medical history significant for  CABG, prior stroke, CHF, [name] liver cirrhosis, diabetes mellitus.  Patient was brought to the ED reports of confusion, facial numbness, left-sided weakness and light headedness.  Spouse is at bedside and assist with the history, the time of my evaluation patient is awake, appears slightly confused and tells me does not remember a lot of what happened.  Patient works at [employer], he mixes the dough.  While at work, he started having dizziness, does not know exactly when the dizziness started. At about 11, patient became agitated and was arguing with his manager because he was not allowed to go on break to eat.  About 12 spouse called to patient's place of work, and noted that he was dragging his left side.  He was also confused.  She immediately brought patient to the ED.   Clinical Impression  Patient functioning near baseline for functional mobility and gait demonstrating good return for transferring to commode in bathroom, ambulation in room and hallways without loss of balance.  Plan:  Patient discharged from physical therapy to care of nursing for ambulation daily as tolerated for length of stay.         Recommendations for follow up therapy are one component of a multi-disciplinary discharge planning process, led by the attending physician.  Recommendations may be updated based on patient status, additional functional criteria and insurance authorization.  Follow Up Recommendations No PT follow up    Equipment Recommendations  None recommended by PT    Recommendations for Other Services       Precautions / Restrictions Precautions Precautions: None Restrictions Weight Bearing Restrictions: No      Mobility  Bed Mobility Overal bed mobility: Modified  Independent             General bed mobility comments: slightly increased time    Transfers Overall transfer level: Modified independent                  Ambulation/Gait Ambulation/Gait assistance: Modified independent (Device/Increase time) Gait Distance (Feet): 200 Feet Assistive device: None Gait Pattern/deviations: WFL(Within Functional Limits) Gait velocity: decreased   General Gait Details: grossly WFL demonstrating good return for ambulation on level, inclined and declined surfaces without loss of balance  Stairs            Wheelchair Mobility    Modified Rankin (Stroke Patients Only)       Balance Overall balance assessment: No apparent balance deficits (not formally assessed)                                           Pertinent Vitals/Pain Pain Assessment: No/denies pain    Home Living Family/patient expects to be discharged to:: Private residence Living Arrangements: Spouse/significant other Available Help at Discharge: Family;Available 24 hours/day Type of Home: House Home Access: Stairs to enter Entrance Stairs-Rails: Right Entrance Stairs-Number of Steps: 2-3 Home Layout: One level Home Equipment: Walker - 2 wheels;Cane - single point;Shower seat;Bedside commode;Grab bars - tub/shower      Prior Function Level of Independence: Independent         Comments: Hydrographic surveyor, drives, works     Journalist, newspaper   Dominant Hand: Right  Extremity/Trunk Assessment   Upper Extremity Assessment Upper Extremity Assessment: Overall WFL for tasks assessed    Lower Extremity Assessment Lower Extremity Assessment: Overall WFL for tasks assessed       Communication   Communication: No difficulties  Cognition Arousal/Alertness: Awake/alert Behavior During Therapy: WFL for tasks assessed/performed Overall Cognitive Status: Within Functional Limits for tasks assessed                                         General Comments      Exercises     Assessment/Plan    PT Assessment Patent does not need any further PT services  PT Problem List         PT Treatment Interventions      PT Goals (Current goals can be found in the Care Plan section)  Acute Rehab PT Goals Patient Stated Goal: return home with family to assist PT Goal Formulation: With patient Time For Goal Achievement: 07/28/21 Potential to Achieve Goals: Good    Frequency     Barriers to discharge        Co-evaluation               AM-PAC PT "6 Clicks" Mobility  Outcome Measure Help needed turning from your back to your side while in a flat bed without using bedrails?: None Help needed moving from lying on your back to sitting on the side of a flat bed without using bedrails?: None Help needed moving to and from a bed to a chair (including a wheelchair)?: None Help needed standing up from a chair using your arms (e.g., wheelchair or bedside chair)?: None Help needed to walk in hospital room?: None Help needed climbing 3-5 steps with a railing? : None 6 Click Score: 24    End of Session   Activity Tolerance: Patient tolerated treatment well Patient left: in bed;with call bell/phone within reach Nurse Communication: Mobility status PT Visit Diagnosis: Unsteadiness on feet (R26.81);Other abnormalities of gait and mobility (R26.89);Muscle weakness (generalized) (M62.81)    Time: 2595-6387 PT Time Calculation (min) (ACUTE ONLY): 13 min   Charges:   PT Evaluation $PT Eval Low Complexity: 1 Low PT Treatments $Therapeutic Activity: 8-22 mins        9:56 AM, 07/28/21 Lonell Grandchild, MPT Physical Therapist with [facility] 336 [phone] office [phone] mobile phone

## 2021-07-29 ENCOUNTER — Other Ambulatory Visit: Payer: Self-pay

## 2021-07-29 ENCOUNTER — Encounter (HOSPITAL_COMMUNITY): Payer: Self-pay | Admitting: Physical Therapy

## 2021-07-29 ENCOUNTER — Ambulatory Visit (HOSPITAL_COMMUNITY): Payer: Medicare Other | Admitting: Physical Therapy

## 2021-07-29 DIAGNOSIS — R42 Dizziness and giddiness: Secondary | ICD-10-CM

## 2021-07-29 DIAGNOSIS — R2681 Unsteadiness on feet: Secondary | ICD-10-CM

## 2021-07-29 DIAGNOSIS — R2689 Other abnormalities of gait and mobility: Secondary | ICD-10-CM

## 2021-07-29 NOTE — Therapy (Signed)
[place], [place], [address] Phone: [phone]   Fax:  [phone]  Physical Therapy Treatment  Patient Details  Name: [name] [name] MRN: [id number] Date of Birth: 07-09-1956 Referring Provider (PT): Su Raynelle Bring, MD   Encounter Date: 07/29/2021   PT End of Session - 07/29/21 1642     Visit Number 4    Number of Visits 8    Date for PT Re-Evaluation 08/05/21    Authorization Type UHC Medicare    PT Start Time 1617    PT Stop Time 1640    PT Time Calculation (min) 23 min    Activity Tolerance Patient tolerated treatment well    Behavior During Therapy [facility] for tasks assessed/performed             Past Medical History:  Diagnosis Date   Anxiety    Arthritis    Asthma    Cataract    Right eye   Chronic lower back pain    Cirrhosis of liver ([place])    Coronary atherosclerosis of native coronary artery    a. LAD & OM stenting;  b. 2007 Cath: nonobs dzs, patent stents;  c. 07/2012 neg MV, EF 61%.  d. 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   LIMA to LAD, free RIMA to OM2, SVG to PDA, open SVG harvest from right thigh   Thrombocytopenia (HCC)    TIA (transient ischemic attack) ~ 2010   Type 2 diabetes mellitus ([place])     Past Surgical History:  Procedure Laterality Date   ANTERIOR CERVICAL DECOMP/DISCECTOMY FUSION  1998   "C3-4"   CARDIAC CATHETERIZATION  "several"   CARDIAC CATHETERIZATION N/A 12/30/2015   Procedure: Left Heart Cath and Coronary Angiography;  Surgeon: [name], MD;  Location: [place] CV LAB;  Service: Cardiovascular;  Laterality: N/A;   CARPAL TUNNEL RELEASE Bilateral 2005    CHOLECYSTECTOMY N/A 11/28/2018   Procedure: LAPAROSCOPIC CHOLECYSTECTOMY;  Surgeon: [name], MD;  Location: [facility];  Service: General;  Laterality: N/A;   COLONOSCOPY     COLONOSCOPY WITH PROPOFOL N/A 09/27/2020   Procedure: COLONOSCOPY WITH PROPOFOL;  Surgeon: [name], MD;  Location: [facility];  Service: Gastroenterology;  Laterality: N/A;   CORONARY ANGIOPLASTY     CORONARY ANGIOPLASTY WITH STENT PLACEMENT  2002; 2003; 11/20/2014   "I have 4 stents after today" (11/20/2014)   CORONARY ARTERY BYPASS GRAFT N/A 01/09/2016   Procedure: CORONARY ARTERY BYPASS GRAFTING (CABG) X 3 UTILIZING RIGHT AND LEFT INTERNAL MAMMARY ARTERY AND ENDOSCOPICALLY HARVESTED SAPHENEOUS VEIN.;  Surgeon: Rexene Alberts, MD;  Location: [place];  Service: Open Heart Surgery;  Laterality: N/A;   ESOPHAGOGASTRODUODENOSCOPY     ESOPHAGOGASTRODUODENOSCOPY (EGD) WITH PROPOFOL N/A 09/27/2020   Procedure: ESOPHAGOGASTRODUODENOSCOPY (EGD) WITH PROPOFOL;  Surgeon: [name], MD;  [facility];  Service: Gastroenterology;  Laterality: N/A;  10   KNEE SURGERY Left 02/2012   "scraped; open"   LEFT HEART CATH AND CORS/GRAFTS ANGIOGRAPHY N/A 08/09/2017  Procedure: LEFT HEART CATH AND CORS/GRAFTS ANGIOGRAPHY;  Surgeon: [name], MD;  Location: [place] CV LAB;  Service: Cardiovascular;  Laterality: N/A;   LEFT HEART CATH AND CORS/GRAFTS ANGIOGRAPHY N/A 11/22/2018   Procedure: LEFT HEART CATH AND CORS/GRAFTS ANGIOGRAPHY;  Surgeon: [name], [name], MD;  Location: [place] CV LAB;  Service: Cardiovascular;  Laterality: N/A;   LEFT HEART CATHETERIZATION WITH CORONARY ANGIOGRAM N/A 07/20/2012   Procedure: LEFT HEART CATHETERIZATION WITH CORONARY ANGIOGRAM;  Surgeon: [name], MD;  Location: [place] CATH LAB;  Service: Cardiovascular;  Laterality: N/A;   LEFT HEART CATHETERIZATION WITH CORONARY ANGIOGRAM N/A 11/20/2014   Procedure: LEFT HEART CATHETERIZATION WITH CORONARY  ANGIOGRAM;  Surgeon: [name], MD;  Location: [facility] CATH LAB;  Service: Cardiovascular;  Laterality: N/A;   LEFT HEART CATHETERIZATION WITH CORONARY ANGIOGRAM N/A 11/26/2014   Procedure: LEFT HEART CATHETERIZATION WITH CORONARY ANGIOGRAM;  Surgeon: [name], MD;  Location: [facility] CATH LAB;  Service: Cardiovascular;  Laterality: N/A;   NEUROPLASTY / TRANSPOSITION ULNAR NERVE AT ELBOW Right ~ 2012   PERCUTANEOUS CORONARY ROTOBLATOR INTERVENTION (PCI-R)  11/20/2014   Procedure: PERCUTANEOUS CORONARY ROTOBLATOR INTERVENTION (PCI-R);  Surgeon: [name], MD;  Location: [facility] CATH LAB;  Service: Cardiovascular;;   SHOULDER ARTHROSCOPY Left ~ 2011   TEE WITHOUT CARDIOVERSION N/A 01/09/2016   Procedure: TRANSESOPHAGEAL ECHOCARDIOGRAM (TEE);  Surgeon: Rexene Alberts, MD;  Location: [place];  Service: Open Heart Surgery;  Laterality: N/A;    There were no vitals filed for this visit.   Subjective Assessment - 07/29/21 1642     Subjective States he has been in and out of the hospital and has been having TIAs. Wife present today and is concerned that his speech is off and his eyes are off and he has ben having weakness and coordination issues. States he went to his primary care provider today. States that he was in the hospital this weekend. Wife is very concerned about pt as hospital said he was fine but she feels he is walking funny and is weak on the left.                [facility] PT Assessment - 07/29/21 0001       Assessment   Medical Diagnosis Dizziness    Referring Provider (PT) Su Raynelle Bring, MD      ROM / Strength   AROM / PROM / Strength Strength      Strength   Strength Assessment Site Hip;Knee;Ankle    Right/Left Hip Right;Left    Right Hip Flexion 4+/5    Left Hip Flexion 4-/5    Right/Left Knee Left;Right    Right Knee Flexion 5/5    Right Knee Extension 5/5    Left Knee Flexion 4-/5    Left Knee Extension 4-/5      Ambulation/Gait   Gait Comments reduced DF on left and  reduced stride length on right                                    PT Education - 07/29/21 1634     Education Details on current presenation, on following up with MD, about noted left sided weakness, on gait issues noted, on typical TIA progression.    Person(s) Educated Patient;Spouse    Methods Explanation    Comprehension Verbalized understanding  PT Short Term Goals - 07/23/21 0823       PT SHORT TERM GOAL #1   Title Patient will be independent with HEP and self-management strategies to improve functional outcomes    Baseline 07/17/21:  Reports compliance with gazing, balance and cervical mobility exercises.    Status Achieved      PT SHORT TERM GOAL #2   Title Patient will report at least 75% overall improvement in dizziness to indicate improvement in ability to perform ADLs.    Baseline 07/17/21:  Reports decreased frequency in dizziness, no % given.    Status On-going      PT SHORT TERM GOAL #3   Title Patient will be able to maintain tandem stance >30 seconds on BLEs to improve stability and reduce risk for falls    Baseline 07/17/21:  Able to maintain tandem stance on solid surface, required HHA on dynamic    Status On-going                      Plan - 07/29/21 1649     Clinical Impression Statement Educated patient and wife about current findings and needing to be cleared by neurologist secondary to frequent TIAs that have been happening. Educated patient and wife and reassured them with current plan. Discharging patient from [place] to follow up with MD. Encouraged patient to get new order for PT/OT/SP for new deficits from recent TIAs. No goals met at this time.   Comorbidities DM, TIA, Cervical fusion, CABG, arthritis    Examination-Activity Limitations Locomotion Level;Transfers;Stairs    Examination-Participation Restrictions Yard Work;Cleaning;Community Activity    Stability/Clinical Decision Making Stable/Uncomplicated     Rehab Potential Fair    PT Frequency 2x / week    PT Duration 4 weeks    PT Treatment/Interventions ADLs/Self Care Home Management;Aquatic Therapy;Biofeedback;Ultrasound;Traction;Functional mobility training;Stair training;Neuromuscular re-education;Patient/family education;Therapeutic activities;Parrafin;Canalith Repostioning;Cryotherapy;Fluidtherapy;Electrical Stimulation;Contrast Bath;Therapeutic exercise;Compression bandaging;Manual lymph drainage;Splinting;Taping;Vasopneumatic Device;Joint Manipulations;Spinal Manipulations;Energy conservation;Dry needling;Manual techniques;Scar mobilization;Balance training;DME Instruction;Gait training;Iontophoresis 66m/ml Dexamethasone;Moist Heat;Vestibular;Visual/perceptual remediation/compensation;Passive range of motion    PT Next Visit Plan DC to HEP    PT Home Exercise Plan Eval: tandem stance; 8/25: X1 and X2 gaze based exercises; 07/17/21: cervical excrusion, upper trap stretch, SLS.    Consulted and Agree with Plan of Care Patient             Patient will benefit from skilled therapeutic intervention in order to improve the following deficits and impairments:  Dizziness, Decreased balance, Decreased range of motion, Decreased activity tolerance, Abnormal gait, Difficulty walking  Visit Diagnosis: Dizziness and giddiness  Unsteadiness on feet  Other abnormalities of gait and mobility     Problem List Patient Active Problem List   Diagnosis Date Noted   Stroke-like symptoms 07/27/2021   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) ([place] 02/19/2020   Liver cirrhosis secondary to NASH ([place] 09/13/2019   GERD (gastroesophageal reflux disease) 09/13/2019   Dysphagia 09/13/2019   DDD (degenerative disc disease), cervical 04/13/2019   RUQ abdominal pain 11/28/2018   Calculus of gallbladder with acute cholecystitis without obstruction    Acute diastolic CHF (congestive heart failure) ([facility] 11/26/2018   Right  upper quadrant abdominal pain 11/26/2018   Acute respiratory failure with hypoxia ([place] 11/26/2018   Pancytopenia, acquired ([place]    Acute kidney injury ([place] 11/20/2018   Aphasia 07/09/2018   Cerebral thrombosis with cerebral infarction 07/09/2018   Chest pain 08/10/2017   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   Coronary artery disease involving  native coronary artery with unstable angina pectoris [facility])    CAD -S/P PCI LAD OM/LAD '13 and 2016 12/31/2015   Hyperlipidemia    Chest pain with high risk of acute coronary syndrome 12/23/2015   Precordial chest pain 12/03/2014   TIA (transient ischemic attack)    CAD- diffuse LAD disease at cath 12/30/15    Thrombocytopenia ([place])    Fever 12/04/2013   Accelerating angina ([place]) 12/03/2013   Type 2 diabetes mellitus (HCC)    Anxiety    PVC's (premature ventricular contractions) 01/06/2013   Obstructive sleep apnea-declines C-pap 07/04/2012   Mixed hyperlipidemia 07/30/2009   Essential hypertension, benign 07/30/2009   4:49 PM, 07/29/21 Jerene Pitch, DPT Physical Therapy with [facility]  [phone] office   [place], [place], [address] Phone: [phone]   Fax:  [phone]  Name: DANEIL BEEM MRN: [id number] Date of Birth: 10-Sep-1956

## 2021-08-04 NOTE — Progress Notes (Signed)
Cardiology Office Note  Date: 08/05/2021   ID: Zohair, Epp 08-19-1956, MRN [id number]  PCP:  Dorothyann Peng, FNP  Cardiologist:  Rozann Lesches, MD Electrophysiologist:  None   Chief Complaint: Intermittent chest pain, lower extremity edema  History of Present Illness: [name] [name] is a 65 y.o. male with a history of CAD,, cirrhosis, asthma, HLD, CABG x3, GERD, TIA, thrombocytopenia, DM2, OSA, PVCs.  He was last seen by Dr. Domenic Polite on 02/13/2021 for follow-up visit.  He reported no significant cardiac symptoms i.e. angina or nitroglycerin use.  He has been compliant with therapy and no obvious intolerances.  His bypass grafts were patent at cardiac catheterization January 2020.  He was continuing current medical therapy including aspirin, Coreg, Zetia, Imdur, Pravachol.  He was no longer on Norvasc.  Systolic blood pressures in the 130s.  He was to keep follow-up with PCP for routine lab work.   Telephone encounter May 16, 2021 with complaints of intermittent chest pain and swelling in abdomen/ankles for least a week.  Staff called the patient and he admitted he had been having spells of fatigue, nausea with on and off pain in his left arm.  This lasted the course of the week.  He had not taken sublingual nitroglycerin.  He was encouraged to go to ED if symptoms persisted or worsened.  He was here last visit secondary to recent phone encounter on July 8 as noted above.  He had noted some lower extremity edema.  He was having issues with fatigue, nausea, and intermittent chest pain.  Also complained of some lightheadedness/dizziness.  He denies any orthostatic symptoms.  He stated that lightheadedness/dizziness could occur at random regardless of position.  He denied any symptoms of palpitations or arrhythmias.  He had a carotid study ordered earlier in the year which he was unable to have due to illness.  He had an upcoming visit with neurologist in [place], [place] with Dr. Wendi Snipes.  He was pending a vestibular evaluation at [facility] outpatient center on August 4 for lightheadedness/dizziness.  He had a follow-up with hematology oncology for history of fatty liver/cirrhosis.  He was compliant with his medication regimen.  His most recent hemoglobin A1c was 6.9% which was an improvement per his statement over previous.  He stated sometimes he had chest pain and some left arm pain.  He denied any palpitations or arrhythmias, CVA or TIA-like symptoms, near syncopal or syncopal episodes.  Denied any PND orthopnea.  Denies any bleeding issues.  No claudication-like symptoms, DVT or PE-like symptoms, or lower extremity edema.  Recent admission for CVA/ TIA like symptoms on 07/27/2021. He was brought to ER with reported confusion, facial numbness, left sided weakness and lightheadedness. CT and MRI demonstrated no acute intracranial abnormalities or infarcts. NSR on telemetry. He was to continue risk factor modification with adjusted dose of aspirin, statin and BP medications including Coreg , Imdur, and Lasix.   He is here today for hospital follow-up.  He denies any focal neurologic deficits as a result of his CVA/TIA-like symptoms other than some mild left-sided weakness.  States he seen his neurologist since discharge from the hospital.  He states his blood pressure was elevated while in the hospital.  He states at home his blood pressures have been normal.  Today's blood pressure is 128/70.  Previous blood pressures during office visits have been in the same range.  He currently denies any lower extremity edema.  His weight today is  228.  He denies any DOE or SOB, PND or orthopnea.. He states at discharge his aspirin was increased to 162 mg daily.  Past Medical History:  Diagnosis Date   Anxiety    Arthritis    Asthma    Cataract    Right eye   Chronic lower back pain    Cirrhosis of liver ([facility])    Coronary atherosclerosis of native coronary artery    a. LAD & OM stenting;   b. 2007 Cath: nonobs dzs, patent stents;  c. 07/2012 neg MV, EF 61%.  d. 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   LIMA to LAD, free RIMA to OM2, SVG to PDA, open SVG harvest from right thigh   Thrombocytopenia (HCC)    TIA (transient ischemic attack) ~ 2010   Type 2 diabetes mellitus ([place])     Past Surgical History:  Procedure Laterality Date   ANTERIOR CERVICAL DECOMP/DISCECTOMY FUSION  1998   "C3-4"   CARDIAC CATHETERIZATION  "several"   CARDIAC CATHETERIZATION N/A 12/30/2015   Procedure: Left Heart Cath and Coronary Angiography;  Surgeon: [name], MD;  Location: [place] [facility];  Service: Cardiovascular;  Laterality: N/A;   CARPAL TUNNEL RELEASE Bilateral 2005   CHOLECYSTECTOMY N/A 11/28/2018   Procedure: LAPAROSCOPIC CHOLECYSTECTOMY;  Surgeon: [name], MD;  Location: [facility];  Service: General;  Laterality: N/A;   COLONOSCOPY     COLONOSCOPY WITH PROPOFOL N/A 09/27/2020   Procedure: COLONOSCOPY WITH PROPOFOL;  Surgeon: [name], MD;  Location: [facility];  Service: Gastroenterology;  Laterality: N/A;   CORONARY ANGIOPLASTY     CORONARY ANGIOPLASTY WITH STENT PLACEMENT  2002; 2003; 11/20/2014   "I have 4 stents after today" (11/20/2014)   CORONARY ARTERY BYPASS GRAFT N/A 01/09/2016   Procedure: CORONARY ARTERY BYPASS GRAFTING (CABG) X 3 UTILIZING RIGHT AND LEFT INTERNAL MAMMARY ARTERY AND ENDOSCOPICALLY HARVESTED SAPHENEOUS VEIN.;  Surgeon: Rexene Alberts, MD;  Location: [place];  Service: Open Heart Surgery;  Laterality: N/A;   ESOPHAGOGASTRODUODENOSCOPY     ESOPHAGOGASTRODUODENOSCOPY (EGD) WITH PROPOFOL N/A 09/27/2020   Procedure:  ESOPHAGOGASTRODUODENOSCOPY (EGD) WITH PROPOFOL;  Surgeon: [name], MD;  [facility];  Service: Gastroenterology;  Laterality: N/A;  10   KNEE SURGERY Left 02/2012   "scraped; open"   LEFT HEART CATH AND CORS/GRAFTS ANGIOGRAPHY N/A 08/09/2017   Procedure: LEFT HEART CATH AND CORS/GRAFTS ANGIOGRAPHY;  Surgeon: [name], MD;  Location: [place] [facility];  Service: Cardiovascular;  Laterality: N/A;   LEFT HEART CATH AND CORS/GRAFTS ANGIOGRAPHY N/A 11/22/2018   Procedure: LEFT HEART CATH AND CORS/GRAFTS ANGIOGRAPHY;  Surgeon: [name], [name], MD;  Location: [place] [facility];  Service: Cardiovascular;  Laterality: N/A;   LEFT HEART CATHETERIZATION WITH CORONARY ANGIOGRAM N/A 07/20/2012   Procedure: LEFT HEART CATHETERIZATION WITH CORONARY ANGIOGRAM;  Surgeon: [name], MD;  Location: [place] [facility];  Service: Cardiovascular;  Laterality: N/A;   LEFT HEART CATHETERIZATION WITH CORONARY ANGIOGRAM N/A 11/20/2014   Procedure: LEFT HEART CATHETERIZATION WITH CORONARY ANGIOGRAM;  Surgeon:  [name], MD;  Location: [facility] C[facility];  Service: Cardiovascular;  Laterality: N/A;   LEFT HEART CATHETERIZATION WITH CORONARY ANGIOGRAM N/A 11/26/2014   Procedure: LEFT HEART CATHETERIZATION WITH CORONARY ANGIOGRAM;  Surgeon: [name], MD;  Location: [facility] C[facility] LAB;  Service: Cardiovascular;  Laterality: N/A;   NEUROPLASTY / TRANSPOSITION ULNAR NERVE AT ELBOW Right ~ 2012   PERCUTANEOUS CORONARY ROTOBLATOR INTERVENTION (PCI-R)  11/20/2014   Procedure: PERCUTANEOUS CORONARY ROTOBLATOR INTERVENTION (PCI-R);  Surgeon: [name], MD;  Location: [facility] CATH LAB;  Service: Cardiovascular;;   SHOULDER ARTHROSCOPY Left ~ 2011   TEE WITHOUT CARDIOVERSION N/A 01/09/2016   Procedure: TRANSESOPHAGEAL ECHOCARDIOGRAM (TEE);  Surgeon: Rexene Alberts, MD;  Location: [place];  Service: Open Heart Surgery;  Laterality: N/A;    Current Outpatient Medications  Medication Sig Dispense Refill    albuterol (PROVENTIL HFA;VENTOLIN HFA) 108 (90 BASE) MCG/ACT inhaler Inhale 2 puffs into the lungs every 6 (six) hours as needed for wheezing or shortness of breath.      aspirin EC 81 MG tablet Take 2 tablets (162 mg total) by mouth at bedtime. (Patient taking differently: Take 81 mg by mouth at bedtime.) 60 tablet 4   carvedilol (COREG) 3.125 MG tablet Take 1 tablet (3.125 mg total) by mouth 2 (two) times daily. 60 tablet 11   cholecalciferol (VITAMIN D3) 25 MCG (1000 UNIT) tablet Take 1,000 Units by mouth daily.     citalopram (CELEXA) 20 MG tablet Take 20 mg by mouth at bedtime.      cyclobenzaprine (FLEXERIL) 10 MG tablet Take 10 mg by mouth 3 (three) times daily as needed for muscle spasms.     ezetimibe (ZETIA) 10 MG tablet Take 10 mg by mouth daily.     ferrous sulfate 325 (65 FE) MG EC tablet Take 325 mg by mouth once a week. Takes on Mondays.     fluticasone (FLONASE) 50 MCG/ACT nasal spray Place 1 spray into the nose 2 (two) times daily.      Fluticasone-Salmeterol (ADVAIR) 250-50 MCG/DOSE AEPB Inhale 1 puff into the lungs every 12 (twelve) hours.     furosemide (LASIX) 20 MG tablet Take 20 mg by mouth daily.     gabapentin (NEURONTIN) 300 MG capsule Take 300 mg by mouth 3 (three) times daily.  2   isosorbide mononitrate (IMDUR) 60 MG 24 hr tablet Take 1 tablet (60 mg total) by mouth daily. 90 tablet 1   Melatonin 5 MG TABS Take 10 mg by mouth at bedtime.     metFORMIN (GLUCOPHAGE) 1000 MG tablet Take 1 tablet (1,000 mg total) by mouth 2 (two) times daily. For the next 2 days as you did receive IV contrast, then resume on 11/26/2018 (Patient taking differently: Take 1,000 mg by mouth 2 (two) times daily.) 60 tablet 11   nitroGLYCERIN (NITROSTAT) 0.4 MG SL tablet Place 0.4 mg under the tongue every 5 (five) minutes x 3 doses as needed for chest pain (if no relief after 2nd dose, proceed to the ED for an evaluation or call 911).     omeprazole (PRILOSEC) 20 MG capsule Take 20 mg by mouth 2  (two) times daily.     pravastatin (PRAVACHOL) 20 MG tablet Take 2 tablets (40 mg total) by mouth daily. 60 tablet 3   topiramate (TOPAMAX) 50 MG tablet Take 50 mg by mouth at bedtime.      TRESIBA FLEXTOUCH 100 UNIT/ML FlexTouch Pen Inject 65 Units into the skin in the morning and  at bedtime. Inject 60 units in the AM and 70 units in the PM.     vitamin B-12 (CYANOCOBALAMIN) 1000 MCG tablet Take 1,000 mcg by mouth daily.      zinc gluconate 50 MG tablet Take 50 mg by mouth daily.     No current facility-administered medications for this visit.   Allergies:  Divalproex sodium, Statins, Tramadol, Valproic acid, Other, Gadolinium derivatives, and Tricor [fenofibrate]   Social History: The patient  reports that he has never smoked. He has never used smokeless tobacco. He reports that he does not drink alcohol and does not use drugs.   Family History: The patient's family history includes Asthma in his sister and sister; Autism in his son; Cancer in his paternal grandfather; Coronary artery disease (age of onset: 44) in his father; Heart attack in his paternal grandmother; Heart disease in his paternal uncle; Migraines in his son and son; Multiple sclerosis in his brother; Seizures in his son; Stroke in his maternal grandmother and mother.   ROS:  Please see the history of present illness. Otherwise, complete review of systems is positive for none.  All other systems are reviewed and negative.   Physical Exam: VS:  BP 128/70   Pulse 70   Ht 5' 9"  (1.753 m)   Wt 228 lb 6.4 oz (103.6 kg)   SpO2 98%   BMI 33.73 kg/m , BMI Body mass index is 33.73 kg/m.  Wt Readings from Last 3 Encounters:  08/05/21 228 lb 6.4 oz (103.6 kg)  07/28/21 230 lb 2.6 oz (104.4 kg)  05/22/21 230 lb 3.2 oz (104.4 kg)    General: Patient appears comfortable at rest. Neck: Supple, no elevated JVP or carotid bruits, no thyromegaly. Lungs: Clear to auscultation, nonlabored breathing at rest. Cardiac: Regular rate and  rhythm, no S3 or significant systolic murmur, no pericardial rub. Extremities: No pitting edema, distal pulses 2+. Skin: Warm and dry. Musculoskeletal: No kyphosis. Neuropsychiatric: Alert and oriented x3, affect grossly appropriate.  ECG: May 22, 2021 normal sinus rhythm rate of 72.  Recent Labwork: 07/27/2021: ALT 34; AST 36 07/28/2021: BUN 13; Creatinine, Ser 1.04; Hemoglobin 13.9; Platelets 76; Potassium 4.0; Sodium 139     Component Value Date/Time   CHOL 183 07/28/2021 0531   TRIG 207 (H) 07/28/2021 0531   HDL 27 (L) 07/28/2021 0531   CHOLHDL 6.8 07/28/2021 0531   VLDL 41 (H) 07/28/2021 0531   LDLCALC 115 (H) 07/28/2021 0531    Other Studies Reviewed Today:  Carotid doppler 07/28/2021 Summary  Right Carotid: Velocities in the right ICA are consistent with a 1-39% stenosis.       Non-hemodynamically significant plaque <50% noted in the CCA.  Left Carotid: Velocities in the left ICA are consistent with a 1-39% stenosis.The ECA appears >50% stenosed   MRI Brain 07/28/2021  IMPRESSION: No evidence of acute intracranial abnormality. Specifically, no acute infarct    CTA Head and Neck 07/27/2021  IMPRESSION: 1. No acute intracranial hemorrhage or infarct. 2. ASPECTS is 10 These results were called by telephone at the time of interpretation on 07/27/2021 at 1:35 pm to provider [facility] , who verbally acknowledged these results.    Carotid artery duplex 07/10/2021 Summary: Right Carotid: Velocities in the right ICA are consistent with a 1-39% stenosis. Non-hemodynamically significant plaque <50% noted in the CCA. Left Carotid: Velocities in the left ICA are consistent with a 1-39% stenosis. The ECA appears >50% stenosed.    Echocardiogram 02/20/2020  1. Left ventricular ejection  fraction, by estimation, is 55 to 60%. The left ventricle has normal function. The left ventricle has no regional wall motion abnormalities. There is mild left ventricular hypertrophy. Left  ventricular diastolic parameters were normal. 2. Right ventricular systolic function is normal. The right ventricular size is normal. There is mildly elevated pulmonary artery systolic pressure. The estimated right ventricular systolic pressure is 48.0 mmHg. 3. No right to left interatrial shunting noted with saline contrast. 4. The mitral valve is grossly normal. Trivial mitral valve regurgitation. 5. The aortic valve is tricuspid. Aortic valve regurgitation is mild. Mild aortic valve sclerosis is present, with no evidence of aortic valve stenosis. 6. The inferior vena cava is dilated in size with >50% respiratory variability, suggesting right atrial pressure of 8 mmHg.   Cardiac catheterization 11/22/2018: Prox LAD to Mid LAD lesion is 90% stenosed. Mid LAD lesion is 90% stenosed. Ost LAD lesion is 50% stenosed. 1st Diag lesion is 80% stenosed. Mid RCA lesion is 40% stenosed. Dist RCA lesion is 40% stenosed. Ost RPDA lesion is 100% stenosed. SVG and is small. LIMA and is small. 1st Mrg lesion is 99% stenosed. Prox Cx lesion is 50% stenosed. RIMA graft was visualized by angiography and is normal in caliber. Origin to Prox Graft lesion is 40% stenosed. LV end diastolic pressure is normal.   1. Severe 3 vessel obstructive CAD 2. Patent LIMA to the distal LAD 3. Patent free RIMA to the OM1 4. Patent SVG to PDA 5. Normal LVEDP   Compared to prior angiogram dated 08/09/17 there is no significant change. Recommend continued medical therapy.   Assessment and Plan:  1. Chest pain of uncertain etiology   2. Lower extremity edema   3. CAD in native artery   4. Mixed hyperlipidemia   5. Essential hypertension   6. Lightheadedness   7. Dizziness      1. Chest pain of uncertain etiology History of CAD with three-vessel bypass.  Currently denies any anginal or exertional symptoms.  At last visit we increased Imdur to 60 mg daily.  After recent discharge from hospital for  CVA/TIA-like symptoms his aspirin was increased to 162 mg p.o. daily. Continue carvedilol 3.125 mg p.o. twice daily.  Continue sublingual nitroglycerin as needed.   2. Lower extremity edema Patient has no lower extremity today.  Continue furosemide 20 mg daily.     3. CAD in native artery CAD status post three-vessel bypass.  At last visit he was complaining of intermittent chest pain.  We increased Imdur to 60 mg daily.  Currently no anginal symptoms   Continue aspirin 81 mg daily.  Continue carvedilol 3.125 mg p.o. twice daily.  Continue sublingual nitroglycerin as needed.  Continue Imdur 60 mg daily  4. Mixed hyperlipidemia Continue Zetia 10 mg daily.  5. Essential hypertension Blood pressure well controlled at 128/70.  Patient states [place] [facility] regularly monitors his blood pressures, blood sugars for changes in trends remotely.[name]  He states if blood pressures or blood sugars are elevated he is usually notified.  Continue carvedilol 3.125 mg p.o. twice daily.  Continue Lasix 20 mg p.o. daily.    6.  Dizziness/lightheadedness/recent CVA/TIA-like symptoms. Recent carotid duplex study on9/19/2022, R ICA 1 to 39%.  Nonhemodynamically significant plaque less than 16% noted in CTA, LICA consistent 553% stenosis.,  The ECA appeared to be greater than 50% stenosis.  Patient states he recently saw his neurologist status post recent CVA/TIA-like symptoms.  Diagnostic studies showed no evidence of infarct or embolism.  Medication  Adjustments/Labs and Tests Ordered: Current medicines are reviewed at length with the patient today.  Concerns regarding medicines are outlined above.   Disposition: Follow-up with Dr. Domenic Polite or APP 6 months  Signed, Levell July, NP 08/05/2021 9:01 AM    [facility] [place], [place], [place] [address] Phone: [phone]; Fax: [phone]

## 2021-08-05 ENCOUNTER — Ambulatory Visit: Payer: Medicare Other | Admitting: Family Medicine

## 2021-08-05 ENCOUNTER — Encounter: Payer: Self-pay | Admitting: Family Medicine

## 2021-08-05 VITALS — BP 128/70 | HR 70 | Ht 69.0 in | Wt 228.4 lb

## 2021-08-05 DIAGNOSIS — R600 Localized edema: Secondary | ICD-10-CM

## 2021-08-05 DIAGNOSIS — I10 Essential (primary) hypertension: Secondary | ICD-10-CM

## 2021-08-05 DIAGNOSIS — R42 Dizziness and giddiness: Secondary | ICD-10-CM

## 2021-08-05 DIAGNOSIS — R079 Chest pain, unspecified: Secondary | ICD-10-CM

## 2021-08-05 DIAGNOSIS — E782 Mixed hyperlipidemia: Secondary | ICD-10-CM

## 2021-08-05 DIAGNOSIS — I2510 Atherosclerotic heart disease of native coronary artery without angina pectoris: Secondary | ICD-10-CM | POA: Diagnosis not present

## 2021-08-05 NOTE — Patient Instructions (Signed)

## 2021-08-20 ENCOUNTER — Inpatient Hospital Stay (HOSPITAL_COMMUNITY): Payer: Medicare HMO | Attending: Otolaryngology

## 2021-08-20 ENCOUNTER — Other Ambulatory Visit (HOSPITAL_COMMUNITY): Payer: Medicare Other

## 2021-08-20 DIAGNOSIS — R42 Dizziness and giddiness: Secondary | ICD-10-CM | POA: Insufficient documentation

## 2021-08-20 DIAGNOSIS — Z8249 Family history of ischemic heart disease and other diseases of the circulatory system: Secondary | ICD-10-CM | POA: Diagnosis not present

## 2021-08-20 DIAGNOSIS — G479 Sleep disorder, unspecified: Secondary | ICD-10-CM | POA: Insufficient documentation

## 2021-08-20 DIAGNOSIS — Z818 Family history of other mental and behavioral disorders: Secondary | ICD-10-CM | POA: Diagnosis not present

## 2021-08-20 DIAGNOSIS — E559 Vitamin D deficiency, unspecified: Secondary | ICD-10-CM | POA: Insufficient documentation

## 2021-08-20 DIAGNOSIS — Z836 Family history of other diseases of the respiratory system: Secondary | ICD-10-CM | POA: Insufficient documentation

## 2021-08-20 DIAGNOSIS — Z809 Family history of malignant neoplasm, unspecified: Secondary | ICD-10-CM | POA: Diagnosis not present

## 2021-08-20 DIAGNOSIS — R2689 Other abnormalities of gait and mobility: Secondary | ICD-10-CM | POA: Insufficient documentation

## 2021-08-20 DIAGNOSIS — Z823 Family history of stroke: Secondary | ICD-10-CM | POA: Insufficient documentation

## 2021-08-20 DIAGNOSIS — D509 Iron deficiency anemia, unspecified: Secondary | ICD-10-CM | POA: Diagnosis present

## 2021-08-20 DIAGNOSIS — K7460 Unspecified cirrhosis of liver: Secondary | ICD-10-CM | POA: Insufficient documentation

## 2021-08-20 DIAGNOSIS — R5383 Other fatigue: Secondary | ICD-10-CM | POA: Insufficient documentation

## 2021-08-20 DIAGNOSIS — D696 Thrombocytopenia, unspecified: Secondary | ICD-10-CM | POA: Diagnosis present

## 2021-08-20 DIAGNOSIS — R2681 Unsteadiness on feet: Secondary | ICD-10-CM | POA: Diagnosis not present

## 2021-08-20 DIAGNOSIS — Z79899 Other long term (current) drug therapy: Secondary | ICD-10-CM | POA: Insufficient documentation

## 2021-08-20 DIAGNOSIS — R079 Chest pain, unspecified: Secondary | ICD-10-CM | POA: Insufficient documentation

## 2021-08-20 LAB — FERRITIN: Ferritin: 27 ng/mL (ref 24–336)

## 2021-08-20 LAB — CBC WITH DIFFERENTIAL/PLATELET
Abs Immature Granulocytes: 0.01 10*3/uL (ref 0.00–0.07)
Basophils Absolute: 0 10*3/uL (ref 0.0–0.1)
Basophils Relative: 1 %
Eosinophils Absolute: 0.1 10*3/uL (ref 0.0–0.5)
Eosinophils Relative: 2 %
HCT: 40.3 % (ref 39.0–52.0)
Hemoglobin: 14.2 g/dL (ref 13.0–17.0)
Immature Granulocytes: 0 %
Lymphocytes Relative: 31 %
Lymphs Abs: 0.9 10*3/uL (ref 0.7–4.0)
MCH: 32.7 pg (ref 26.0–34.0)
MCHC: 35.2 g/dL (ref 30.0–36.0)
MCV: 92.9 fL (ref 80.0–100.0)
Monocytes Absolute: 0.2 10*3/uL (ref 0.1–1.0)
Monocytes Relative: 8 %
Neutro Abs: 1.8 10*3/uL (ref 1.7–7.7)
Neutrophils Relative %: 58 %
Platelets: 102 10*3/uL — ABNORMAL LOW (ref 150–400)
RBC: 4.34 MIL/uL (ref 4.22–5.81)
RDW: 13.4 % (ref 11.5–15.5)
WBC: 3 10*3/uL — ABNORMAL LOW (ref 4.0–10.5)
nRBC: 0 % (ref 0.0–0.2)

## 2021-08-20 LAB — IRON AND TIBC
Iron: 112 ug/dL (ref 45–182)
Saturation Ratios: 29 % (ref 17.9–39.5)
TIBC: 388 ug/dL (ref 250–450)
UIBC: 276 ug/dL

## 2021-08-20 LAB — COMPREHENSIVE METABOLIC PANEL
ALT: 24 U/L (ref 0–44)
AST: 26 U/L (ref 15–41)
Albumin: 3.7 g/dL (ref 3.5–5.0)
Alkaline Phosphatase: 50 U/L (ref 38–126)
Anion gap: 8 (ref 5–15)
BUN: 19 mg/dL (ref 8–23)
CO2: 20 mmol/L — ABNORMAL LOW (ref 22–32)
Calcium: 8.4 mg/dL — ABNORMAL LOW (ref 8.9–10.3)
Chloride: 107 mmol/L (ref 98–111)
Creatinine, Ser: 1.09 mg/dL (ref 0.61–1.24)
GFR, Estimated: >60 mL/min (ref >60)
Glucose, Bld: 230 mg/dL — ABNORMAL HIGH (ref 70–99)
Potassium: 3.6 mmol/L (ref 3.5–5.1)
Sodium: 135 mmol/L (ref 135–145)
Total Bilirubin: 0.8 mg/dL (ref 0.3–1.2)
Total Protein: 7.2 g/dL (ref 6.5–8.1)

## 2021-08-20 LAB — VITAMIN B12: Vitamin B-12: 464 pg/mL (ref 180–914)

## 2021-08-20 LAB — VITAMIN D 25 HYDROXY (VIT D DEFICIENCY, FRACTURES): Vit D, 25-Hydroxy: 30.13 ng/mL (ref 30–100)

## 2021-08-20 LAB — FOLATE: Folate: 8.7 ng/mL (ref >5.9)
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
## 2021-08-27 ENCOUNTER — Other Ambulatory Visit: Payer: Self-pay

## 2021-08-27 ENCOUNTER — Inpatient Hospital Stay (HOSPITAL_BASED_OUTPATIENT_CLINIC_OR_DEPARTMENT_OTHER): Payer: Medicare HMO | Admitting: Physician Assistant

## 2021-08-27 ENCOUNTER — Encounter (HOSPITAL_COMMUNITY): Payer: Self-pay | Admitting: Physician Assistant

## 2021-08-27 ENCOUNTER — Ambulatory Visit (HOSPITAL_COMMUNITY): Payer: Medicare Other | Admitting: Physician Assistant

## 2021-08-27 VITALS — BP 159/73 | HR 70 | Temp 97.0°F | Resp 18 | Wt 222.9 lb

## 2021-08-27 DIAGNOSIS — E559 Vitamin D deficiency, unspecified: Secondary | ICD-10-CM

## 2021-08-27 DIAGNOSIS — E611 Iron deficiency: Secondary | ICD-10-CM | POA: Insufficient documentation

## 2021-08-27 DIAGNOSIS — D696 Thrombocytopenia, unspecified: Secondary | ICD-10-CM | POA: Diagnosis not present

## 2021-08-27 HISTORY — DX: Iron deficiency: E61.1

## 2021-08-27 NOTE — Patient Instructions (Signed)
[facility] Discharge Instructions  You were seen today by Tarri Abernethy PA-C for your low platelets and your low iron.  As we have discussed, your low platelets ("thrombocytopenia") is related to your underlying liver cirrhosis.  Your platelets are at their normal baseline range.  We will continue to monitor them with repeat labs in 6 months.  However, if you experience any abnormal bruising or bleeding before your next appointment please call our office to schedule a follow-up appointment sooner rather than later.  Due to your low iron levels, I recommend IV iron with Venofer in 3 doses.  Since your iron pill did not seem to be helping your iron levels to improve, you can stop taking it at home.  LABS: Return in 6 months for repeat labs  OTHER TESTS: No other tests at this time  MEDICATIONS: - Stop taking iron pill. - Continue taking vitamin D supplement daily  FOLLOW-UP APPOINTMENT: Office visit in 6 months, after labs   Thank you for choosing [facility] [facility] to provide your oncology and hematology care.  To afford each patient quality time with our provider, please arrive at least 15 minutes before your scheduled appointment time.   If you have a lab appointment with the [facility] please come in thru the Main Entrance and check in at the main information desk.  You need to re-schedule your appointment should you arrive 10 or more minutes late.  We strive to give you quality time with our providers, and arriving late affects you and other patients whose appointments are after yours.  Also, if you no show three or more times for appointments you may be dismissed from the clinic at the providers discretion.     Again, thank you for choosing [facility].  Our hope is that these requests will decrease the amount of time that you wait before being seen by our physicians.        _____________________________________________________________  Should you have questions after your visit to [facility], please contact our office at [phone] and follow the prompts.  Our office hours are 8:00 a.m. and 4:30 p.m. Monday - Friday.  Please note that voicemails left after 4:00 p.m. may not be returned until the following business day.  We are closed weekends and major holidays.  You do have access to a nurse 24-7, just call the main number to the clinic [phone] and do not press any options, hold on the line and a nurse will answer the phone.    For prescription refill requests, have your pharmacy contact our office and allow 72 hours.    Due to Covid, you will need to wear a mask upon entering the hospital. If you do not have a mask, a mask will be given to you at the Main Entrance upon arrival. For doctor visits, patients may have 1 support person age 70 or older with them. For treatment visits, patients can not have anyone with them due to social distancing guidelines and our immunocompromised population.

## 2021-08-29 ENCOUNTER — Other Ambulatory Visit: Payer: Self-pay

## 2021-08-29 ENCOUNTER — Inpatient Hospital Stay (HOSPITAL_COMMUNITY): Payer: Medicare HMO

## 2021-08-29 VITALS — BP 155/70 | HR 65 | Temp 97.5°F | Resp 18

## 2021-08-29 DIAGNOSIS — D696 Thrombocytopenia, unspecified: Secondary | ICD-10-CM | POA: Diagnosis not present

## 2021-08-29 DIAGNOSIS — E611 Iron deficiency: Secondary | ICD-10-CM

## 2021-08-29 MED ORDER — ACETAMINOPHEN 325 MG PO TABS
650.0000 mg | ORAL_TABLET | Freq: Once | ORAL | Status: AC
  Administered 2021-08-29: 650 mg via ORAL
  Filled 2021-08-29: qty 2

## 2021-08-29 MED ORDER — FAMOTIDINE 20 MG PO TABS
20.0000 mg | ORAL_TABLET | Freq: Once | ORAL | Status: AC
  Administered 2021-08-29: 20 mg via ORAL
  Filled 2021-08-29: qty 1

## 2021-08-29 MED ORDER — SODIUM CHLORIDE 0.9 % IV SOLN: Freq: Once | INTRAVENOUS | Status: AC

## 2021-08-29 MED ORDER — SODIUM CHLORIDE 0.9 % IV SOLN
300.0000 mg | Freq: Once | INTRAVENOUS | Status: AC
  Administered 2021-08-29: 300 mg via INTRAVENOUS
  Filled 2021-08-29: qty 300

## 2021-08-29 MED ORDER — LORATADINE 10 MG PO TABS
10.0000 mg | ORAL_TABLET | Freq: Once | ORAL | Status: AC
  Administered 2021-08-29: 10 mg via ORAL
  Filled 2021-08-29: qty 1

## 2021-08-29 NOTE — Progress Notes (Signed)
Patient presents today for iron infusion.  Patient is in satisfactory condition with no new complaints voiced.  Vital signs are stable.  We will proceed with treatment per MD orders.   Patient tolerated treatment well with no complaints voiced.  Patient left ambulatory in stable condition.  Vital signs stable at discharge.  Follow up as scheduled.

## 2021-08-29 NOTE — Patient Instructions (Signed)
[facility]  Discharge Instructions: Thank you for choosing [facility] to provide your oncology and hematology care.  If you have a lab appointment with the Cancer Center, please come in thru the Main Entrance and check in at the main information desk.  Wear comfortable clothing and clothing appropriate for easy access to any Portacath or PICC line.   We strive to give you quality time with your provider. You may need to reschedule your appointment if you arrive late (15 or more minutes).  Arriving late affects you and other patients whose appointments are after yours.  Also, if you miss three or more appointments without notifying the office, you may be dismissed from the clinic at the provider's discretion.      For prescription refill requests, have your pharmacy contact our office and allow 72 hours for refills to be completed.        To help prevent nausea and vomiting after your treatment, we encourage you to take your nausea medication as directed.  BELOW ARE SYMPTOMS THAT SHOULD BE REPORTED IMMEDIATELY: *FEVER GREATER THAN 100.4 F (38 C) OR HIGHER *CHILLS OR SWEATING *NAUSEA AND VOMITING THAT IS NOT CONTROLLED WITH YOUR NAUSEA MEDICATION *UNUSUAL SHORTNESS OF BREATH *UNUSUAL BRUISING OR BLEEDING *URINARY PROBLEMS (pain or burning when urinating, or frequent urination) *BOWEL PROBLEMS (unusual diarrhea, constipation, pain near the anus) TENDERNESS IN MOUTH AND THROAT WITH OR WITHOUT PRESENCE OF ULCERS (sore throat, sores in mouth, or a toothache) UNUSUAL RASH, SWELLING OR PAIN  UNUSUAL VAGINAL DISCHARGE OR ITCHING   Items with * indicate a potential emergency and should be followed up as soon as possible or go to the Emergency Department if any problems should occur.  Please show the CHEMOTHERAPY ALERT CARD or IMMUNOTHERAPY ALERT CARD at check-in to the Emergency Department and triage nurse.  Should you have questions after your visit or need to cancel  or reschedule your appointment, please contact [facility] [phone]  and follow the prompts.  Office hours are 8:00 a.m. to 4:30 p.m. Monday - Friday. Please note that voicemails left after 4:00 p.m. may not be returned until the following business day.  We are closed weekends and major holidays. You have access to a nurse at all times for urgent questions. Please call the main number to the clinic [phone] and follow the prompts.  For any non-urgent questions, you may also contact your provider using MyChart. We now offer e-Visits for anyone 18 and older to request care online for non-urgent symptoms. For details visit [email or web address].   Also download the MyChart app! Go to the app store, search "MyChart", open the app, select [facility], and log in with your MyChart username and password.  Due to Covid, a mask is required upon entering the hospital/clinic. If you do not have a mask, one will be given to you upon arrival. For doctor visits, patients may have 1 support person aged 18 or older with them. For treatment visits, patients cannot have anyone with them due to current Covid guidelines and our immunocompromised population.  

## 2021-09-03 ENCOUNTER — Emergency Department (HOSPITAL_COMMUNITY): Payer: Medicare HMO

## 2021-09-03 ENCOUNTER — Emergency Department (HOSPITAL_COMMUNITY)
Admission: EM | Admit: 2021-09-03 | Discharge: 2021-09-03 | Disposition: A | Discharge status: Home / Self Care | Payer: Medicare HMO | Attending: Emergency Medicine | Admitting: Emergency Medicine

## 2021-09-03 ENCOUNTER — Encounter (HOSPITAL_COMMUNITY): Payer: Self-pay

## 2021-09-03 DIAGNOSIS — I25110 Atherosclerotic heart disease of native coronary artery with unstable angina pectoris: Secondary | ICD-10-CM | POA: Diagnosis not present

## 2021-09-03 DIAGNOSIS — Z20822 Contact with and (suspected) exposure to covid-19: Secondary | ICD-10-CM | POA: Insufficient documentation

## 2021-09-03 DIAGNOSIS — Z794 Long term (current) use of insulin: Secondary | ICD-10-CM | POA: Insufficient documentation

## 2021-09-03 DIAGNOSIS — R630 Anorexia: Secondary | ICD-10-CM | POA: Diagnosis not present

## 2021-09-03 DIAGNOSIS — Z79899 Other long term (current) drug therapy: Secondary | ICD-10-CM | POA: Insufficient documentation

## 2021-09-03 DIAGNOSIS — R509 Fever, unspecified: Secondary | ICD-10-CM | POA: Diagnosis present

## 2021-09-03 DIAGNOSIS — B349 Viral infection, unspecified: Secondary | ICD-10-CM | POA: Insufficient documentation

## 2021-09-03 DIAGNOSIS — E119 Type 2 diabetes mellitus without complications: Secondary | ICD-10-CM | POA: Insufficient documentation

## 2021-09-03 DIAGNOSIS — Z7951 Long term (current) use of inhaled steroids: Secondary | ICD-10-CM | POA: Diagnosis not present

## 2021-09-03 DIAGNOSIS — Z7982 Long term (current) use of aspirin: Secondary | ICD-10-CM | POA: Insufficient documentation

## 2021-09-03 DIAGNOSIS — Z7984 Long term (current) use of oral hypoglycemic drugs: Secondary | ICD-10-CM | POA: Insufficient documentation

## 2021-09-03 DIAGNOSIS — I110 Hypertensive heart disease with heart failure: Secondary | ICD-10-CM | POA: Diagnosis not present

## 2021-09-03 DIAGNOSIS — J45909 Unspecified asthma, uncomplicated: Secondary | ICD-10-CM | POA: Diagnosis not present

## 2021-09-03 DIAGNOSIS — R059 Cough, unspecified: Secondary | ICD-10-CM

## 2021-09-03 DIAGNOSIS — I5031 Acute diastolic (congestive) heart failure: Secondary | ICD-10-CM | POA: Insufficient documentation

## 2021-09-03 LAB — CBC WITH DIFFERENTIAL/PLATELET
Abs Immature Granulocytes: 0.02 10*3/uL (ref 0.00–0.07)
Basophils Absolute: 0 10*3/uL (ref 0.0–0.1)
Basophils Relative: 0 %
Eosinophils Absolute: 0.1 10*3/uL (ref 0.0–0.5)
Eosinophils Relative: 1 %
HCT: 42.8 % (ref 39.0–52.0)
Hemoglobin: 14.5 g/dL (ref 13.0–17.0)
Immature Granulocytes: 0 %
Lymphocytes Relative: 19 %
Lymphs Abs: 0.9 10*3/uL (ref 0.7–4.0)
MCH: 31.4 pg (ref 26.0–34.0)
MCHC: 33.9 g/dL (ref 30.0–36.0)
MCV: 92.6 fL (ref 80.0–100.0)
Monocytes Absolute: 0.4 10*3/uL (ref 0.1–1.0)
Monocytes Relative: 8 %
Neutro Abs: 3.2 10*3/uL (ref 1.7–7.7)
Neutrophils Relative %: 72 %
Platelets: 85 10*3/uL — ABNORMAL LOW (ref 150–400)
RBC: 4.62 MIL/uL (ref 4.22–5.81)
RDW: 13.4 % (ref 11.5–15.5)
WBC: 4.5 10*3/uL (ref 4.0–10.5)
nRBC: 0 % (ref 0.0–0.2)

## 2021-09-03 LAB — COMPREHENSIVE METABOLIC PANEL
ALT: 30 U/L (ref 0–44)
AST: 36 U/L (ref 15–41)
Albumin: 3.4 g/dL — ABNORMAL LOW (ref 3.5–5.0)
Alkaline Phosphatase: 48 U/L (ref 38–126)
Anion gap: 7 (ref 5–15)
BUN: 16 mg/dL (ref 8–23)
CO2: 23 mmol/L (ref 22–32)
Calcium: 8.7 mg/dL — ABNORMAL LOW (ref 8.9–10.3)
Chloride: 103 mmol/L (ref 98–111)
Creatinine, Ser: 0.96 mg/dL (ref 0.61–1.24)
GFR, Estimated: >60 mL/min (ref >60)
Glucose, Bld: 253 mg/dL — ABNORMAL HIGH (ref 70–99)
Potassium: 3.6 mmol/L (ref 3.5–5.1)
Sodium: 133 mmol/L — ABNORMAL LOW (ref 135–145)
Total Bilirubin: 0.8 mg/dL (ref 0.3–1.2)
Total Protein: 7.7 g/dL (ref 6.5–8.1)

## 2021-09-03 LAB — RESP PANEL BY RT-PCR (FLU A&B, COVID) ARPGX2
Influenza A by PCR: NEGATIVE
Influenza B by PCR: NEGATIVE
SARS Coronavirus 2 by RT PCR: NEGATIVE

## 2021-09-03 LAB — LACTIC ACID, PLASMA: Lactic Acid, Venous: 1.7 mmol/L (ref 0.5–1.9)

## 2021-09-03 MED ORDER — AZITHROMYCIN 250 MG PO TABS
500.0000 mg | ORAL_TABLET | Freq: Once | ORAL | Status: AC
  Administered 2021-09-03: 500 mg via ORAL
  Filled 2021-09-03: qty 2

## 2021-09-03 MED ORDER — PROCHLORPERAZINE MALEATE 5 MG PO TABS
10.0000 mg | ORAL_TABLET | Freq: Once | ORAL | Status: AC
  Administered 2021-09-03: 10 mg via ORAL
  Filled 2021-09-03: qty 2

## 2021-09-03 MED ORDER — ONDANSETRON 4 MG PO TBDP
4.0000 mg | ORAL_TABLET | Freq: Once | ORAL | Status: AC
  Administered 2021-09-03: 4 mg via ORAL
  Filled 2021-09-03: qty 1

## 2021-09-03 MED ORDER — AZITHROMYCIN 250 MG PO TABS: 250.0000 mg | ORAL_TABLET | Freq: Every day | ORAL | 0 refills | Status: AC

## 2021-09-03 MED ORDER — METOCLOPRAMIDE HCL 5 MG/ML IJ SOLN: 10.0000 mg | Freq: Once | INTRAMUSCULAR | Status: DC

## 2021-09-03 MED ORDER — ONDANSETRON HCL 4 MG PO TABS: 4.0000 mg | ORAL_TABLET | Freq: Three times a day (TID) | ORAL | 0 refills | Status: DC | PRN

## 2021-09-03 NOTE — ED Provider Notes (Signed)
[facility] EMERGENCY DEPARTMENT Provider Note   CSN: [id number] Arrival date & time: 09/03/21  1600     History Chief Complaint  Patient presents with   Fever    [name] [name] is a 65 y.o. male.  HPI  Patient with significant medical history including asthma, chronic back pain, cirrhosis of the liver, thrombocytopenia, TIA, type 2 diabetes presents to the emergency department with chief complaint of worsening URI-like symptoms.  Patient states the symptoms started on Friday, he endorses subjective fevers, chills, nasal congestion, a slight productive cough, decrease in appetite, nausea, vomiting, diarrhea.  He is unaware of any recent sick contacts, he is vaccinating against COVID and influenza, he denies any chest pain, shortness of breath states he has been taking over-the-counter pain medications without much relief.  He was seen at his urgent care yesterday where no lab work or imaging was obtained, he did have a negative flu and COVID test, they were told that he has a viral illness and was sent home.  They are here today because his symptoms have gotten worse as he now has a slight fever which he was not really having before.  Past Medical History:  Diagnosis Date   Anxiety    Arthritis    Asthma    Cataract    Right eye   Chronic lower back pain    Cirrhosis of liver ([place])    Coronary atherosclerosis of native coronary artery    a. LAD & OM stenting;  b. 2007 Cath: nonobs dzs, patent stents;  c. 07/2012 neg MV, EF 61%.  d. 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   LIMA to LAD, free RIMA to OM2, SVG to PDA, open SVG harvest from right thigh    Thrombocytopenia (HCC)    TIA (transient ischemic attack) ~ 2010   Type 2 diabetes mellitus [facility])     Patient Active Problem List   Diagnosis Date Noted   Iron deficiency 08/27/2021   Stroke-like symptoms 07/27/2021   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) ([place]) 02/19/2020   Liver cirrhosis secondary to NASH ([place]) 09/13/2019   GERD (gastroesophageal reflux disease) 09/13/2019   Dysphagia 09/13/2019   DDD (degenerative disc disease), cervical 04/13/2019   RUQ abdominal pain 11/28/2018   Calculus of gallbladder with acute cholecystitis without obstruction    Acute diastolic CHF (congestive heart failure) ([place]) 11/26/2018   Right upper quadrant abdominal pain 11/26/2018   Acute respiratory failure with hypoxia ([place]) 11/26/2018   Pancytopenia, acquired ([place])    Acute kidney injury ([place]) 11/20/2018   Aphasia 07/09/2018   Cerebral thrombosis with cerebral infarction 07/09/2018   Chest pain 08/10/2017   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   Coronary artery disease involving native coronary artery with unstable angina pectoris ([place])    CAD -S/P PCI LAD OM/LAD '13 and 2016 12/31/2015   Hyperlipidemia    Chest pain with high risk of acute coronary syndrome 12/23/2015   Precordial chest pain 12/03/2014   TIA (transient ischemic attack)  CAD- diffuse LAD disease at cath 12/30/15    Thrombocytopenia ([place])    Fever 12/04/2013   Accelerating angina ([place]) 12/03/2013   Type 2 diabetes mellitus (HCC)    Anxiety    PVC's (premature ventricular contractions) 01/06/2013   Obstructive sleep apnea-declines C-pap 07/04/2012   Mixed hyperlipidemia 07/30/2009   Essential hypertension, benign 07/30/2009    Past Surgical History:  Procedure Laterality Date   ANTERIOR CERVICAL DECOMP/DISCECTOMY FUSION  1998   "C3-4"   CARDIAC CATHETERIZATION  "several"   CARDIAC CATHETERIZATION N/A 12/30/2015   Procedure: Left Heart Cath and Coronary Angiography;  Surgeon: [name],  MD;  Location: [place] CV LAB;  Service: Cardiovascular;  Laterality: N/A;   CARPAL TUNNEL RELEASE Bilateral 2005   CHOLECYSTECTOMY N/A 11/28/2018   Procedure: LAPAROSCOPIC CHOLECYSTECTOMY;  Surgeon: [name], MD;  Location: [facility] OR[facility];  Service: General;  Laterality: N/A;   COLONOSCOPY     COLONOSCOPY WITH PROPOFOL N/A 09/27/2020   Procedure: COLONOSCOPY WITH PROPOFOL;  Surgeon: [name], MD;  Location: [facility] END[facility] SU[facility];  Service: Gastroenterology;  Laterality: N/A;   CORONARY ANGIOPLASTY     CORONARY ANGIOPLASTY WITH STENT PLACEMENT  2002; 2003; 11/20/2014   "I have 4 stents after today" (11/20/2014)   CORONARY ARTERY BYPASS GRAFT N/A 01/09/2016   Procedure: CORONARY ARTERY BYPASS GRAFTING (CABG) X 3 UTILIZING RIGHT AND LEFT INTERNAL MAMMARY ARTERY AND ENDOSCOPICALLY HARVESTED SAPHENEOUS VEIN.;  Surgeon: Rexene Alberts, MD;  Location: [place];  Service: Open Heart Surgery;  Laterality: N/A;   ESOPHAGOGASTRODUODENOSCOPY     ESOPHAGOGASTRODUODENOSCOPY (EGD) WITH PROPOFOL N/A 09/27/2020   Procedure: ESOPHAGOGASTRODUODENOSCOPY (EGD) WITH PROPOFOL;  Surgeon: [name], MD;  [facility];  Service: Gastroenterology;  Laterality: N/A;  10   KNEE SURGERY Left 02/2012   "scraped; open"   LEFT HEART CATH AND CORS/GRAFTS ANGIOGRAPHY N/A 08/09/2017   Procedure: LEFT HEART CATH AND CORS/GRAFTS ANGIOGRAPHY;  Surgeon: [name], MD;  Location: [place] CV LAB;  Service: Cardiovascular;  Laterality: N/A;   LEFT HEART CATH AND CORS/GRAFTS ANGIOGRAPHY N/A 11/22/2018   Procedure: LEFT HEART CATH AND CORS/GRAFTS ANGIOGRAPHY;  Surgeon: [name], [name], MD;  Location: [place] CV LAB;  Service: Cardiovascular;  Laterality: N/A;   LEFT HEART CATHETERIZATION WITH CORONARY ANGIOGRAM N/A 07/20/2012   Procedure: LEFT HEART CATHETERIZATION WITH CORONARY ANGIOGRAM;  Surgeon: [name], MD;  Location: [place] CATH LAB;  Service: Cardiovascular;  Laterality: N/A;    LEFT HEART CATHETERIZATION WITH CORONARY ANGIOGRAM N/A 11/20/2014   Procedure: LEFT HEART CATHETERIZATION WITH CORONARY ANGIOGRAM;  Surgeon: [name], MD;  Location: [facility] CATH LAB;  Service: Cardiovascular;  Laterality: N/A;   LEFT HEART CATHETERIZATION WITH CORONARY ANGIOGRAM N/A 11/26/2014   Procedure: LEFT HEART CATHETERIZATION WITH CORONARY ANGIOGRAM;  Surgeon: [name], MD;  Location: [facility] CATH LAB;  Service: Cardiovascular;  Laterality: N/A;   NEUROPLASTY / TRANSPOSITION ULNAR NERVE AT ELBOW Right ~ 2012   PERCUTANEOUS CORONARY ROTOBLATOR INTERVENTION (PCI-R)  11/20/2014   Procedure: PERCUTANEOUS CORONARY ROTOBLATOR INTERVENTION (PCI-R);  Surgeon: [name], MD;  Location: [facility] CATH LAB;  Service: Cardiovascular;;   SHOULDER ARTHROSCOPY Left ~ 2011   TEE WITHOUT CARDIOVERSION N/A 01/09/2016   Procedure: TRANSESOPHAGEAL ECHOCARDIOGRAM (TEE);  Surgeon: Rexene Alberts, MD;  Location: [place];  Service: Open Heart Surgery;  Laterality: N/A;       Family History  Problem Relation Age of Onset   Stroke Mother    Coronary artery disease Father  80   Asthma Sister    Multiple sclerosis Brother    Heart disease Paternal Uncle    Stroke Maternal Grandmother    Heart attack Paternal Grandmother    Cancer Paternal Grandfather    Asthma Sister    Seizures Son    Migraines Son    Autism Son    Migraines Son     Social History   Tobacco Use   Smoking status: Never   Smokeless tobacco: Never  Vaping Use   Vaping Use: Never used  Substance Use Topics   Alcohol use: No   Drug use: No    Home Medications Prior to Admission medications   Medication Sig Start Date End Date Taking? Authorizing Provider  azithromycin (ZITHROMAX) 250 MG tablet Take 1 tablet (250 mg total) by mouth daily for 4 days. Take 1 tablet daily. 09/03/21 09/07/21 Yes Marcello Fennel, PA-C  ondansetron (ZOFRAN) 4 MG tablet Take 1 tablet (4 mg total) by mouth every 8 (eight) hours as needed for nausea or  vomiting. 09/03/21  Yes Marcello Fennel, PA-C  albuterol (PROVENTIL HFA;VENTOLIN HFA) 108 (90 BASE) MCG/ACT inhaler Inhale 2 puffs into the lungs every 6 (six) hours as needed for wheezing or shortness of breath.    [provider]  aspirin EC 81 MG tablet Take 2 tablets (162 mg total) by mouth at bedtime. Patient taking differently: Take 81 mg by mouth at bedtime. 07/28/21   Barton Dubois, MD  carvedilol (COREG) 3.125 MG tablet Take 1 tablet (3.125 mg total) by mouth 2 (two) times daily. 09/27/20 09/27/21  [name], MD  cholecalciferol (VITAMIN D3) 25 MCG (1000 UNIT) tablet Take 1,000 Units by mouth daily.    [provider]  citalopram (CELEXA) 20 MG tablet Take 20 mg by mouth at bedtime.     [provider]  clopidogrel (PLAVIX) 75 MG tablet Take 75 mg by mouth daily. 08/28/21   [provider]  cyclobenzaprine (FLEXERIL) 10 MG tablet Take 10 mg by mouth 3 (three) times daily as needed for muscle spasms. 08/15/20   [provider]  ezetimibe (ZETIA) 10 MG tablet Take 10 mg by mouth daily.    [provider]  ferrous sulfate 325 (65 FE) MG EC tablet Take 325 mg by mouth once a week. Takes on Mondays.    [provider]  fluticasone (FLONASE) 50 MCG/ACT nasal spray Place 1 spray into the nose 2 (two) times daily.     [provider]  Fluticasone-Salmeterol (ADVAIR) 250-50 MCG/DOSE AEPB Inhale 1 puff into the lungs every 12 (twelve) hours.    [provider]  furosemide (LASIX) 20 MG tablet Take 20 mg by mouth daily. 10/14/19   [provider]  gabapentin (NEURONTIN) 300 MG capsule Take 300 mg by mouth 3 (three) times daily. 07/09/17   [provider]  isosorbide mononitrate (IMDUR) 60 MG 24 hr tablet Take 1 tablet (60 mg total) by mouth daily. 05/22/21   Verta Ellen., NP  meclizine (ANTIVERT) 25 MG tablet Take 12.5 mg by mouth 3 (three) times daily. 08/01/21   [provider]  Melatonin 5 MG TABS Take 10 mg by mouth at bedtime.    [provider]  metFORMIN (GLUCOPHAGE) 1000 MG tablet Take 1 tablet (1,000 mg total) by mouth 2 (two) times daily. For the next 2 days as you did receive IV contrast, then resume on 11/26/2018 Patient taking differently: Take 1,000 mg by mouth 2 (two)  times daily. 11/23/18   Elgergawy, Silver Huguenin, MD  nitroGLYCERIN (NITROSTAT) 0.4 MG SL tablet Place 0.4 mg under the tongue every 5 (five) minutes x 3 doses as needed for chest pain (if no relief after 2nd dose, proceed to the ED for an evaluation or call 911).    [provider]  omeprazole (PRILOSEC) 20 MG capsule Take 20 mg by mouth 2 (two) times daily. 04/18/20   [provider]  pravastatin (PRAVACHOL) 20 MG tablet Take 2 tablets (40 mg total) by mouth daily. 07/28/21   Barton Dubois, MD  topiramate (TOPAMAX) 50 MG tablet Take 50 mg by mouth at bedtime.  04/18/20   [provider]  TRESIBA FLEXTOUCH 100 UNIT/ML FlexTouch Pen Inject 65 Units into the skin in the morning and at bedtime. Inject 60 units in the AM and 70 units in the PM. 07/28/21   Barton Dubois, MD  vitamin B-12 (CYANOCOBALAMIN) 1000 MCG tablet Take 1,000 mcg by mouth daily.     [provider]  zinc gluconate 50 MG tablet Take 50 mg by mouth daily.    [provider]  zonisamide (ZONEGRAN) 100 MG capsule Take by mouth. 08/28/21   [provider]    Allergies    Divalproex sodium, Statins, Tramadol, Valproic acid, Other, Gadolinium derivatives, and Tricor [fenofibrate]  Review of Systems   Review of Systems  Constitutional:  Positive for chills and fever.  HENT:  Positive for congestion. Negative for sore throat.   Respiratory:  Positive for cough. Negative for shortness of breath.   Cardiovascular:  Negative for chest pain.  Gastrointestinal:  Positive for diarrhea, nausea and vomiting. Negative for abdominal pain.  Genitourinary:  Negative for  enuresis.  Musculoskeletal:  Negative for back pain.  Skin:  Negative for rash.  Neurological:  Positive for headaches. Negative for dizziness.  Hematological:  Does not bruise/bleed easily.   Physical Exam Updated Vital Signs BP (!) 153/74   Pulse 86   Temp (!) 100.4 F (38 C) (Oral)   Resp 20   Ht 5' 9"  (1.753 m)   Wt 99.8 kg   SpO2 94%   BMI 32.49 kg/m   Physical Exam Vitals and nursing note reviewed.  Constitutional:      General: He is not in acute distress.    Appearance: He is not ill-appearing.  HENT:     Head: Normocephalic and atraumatic.     Right Ear: Tympanic membrane, ear canal and external ear normal.     Left Ear: Tympanic membrane, ear canal and external ear normal.     Nose: Congestion present.     Mouth/Throat:     Mouth: Mucous membranes are moist.     Pharynx: Oropharynx is clear. No oropharyngeal exudate or posterior oropharyngeal erythema.     Comments: Cobblestoning noted in the posterior pharynx. Eyes:     Conjunctiva/sclera: Conjunctivae normal.  Cardiovascular:     Rate and Rhythm: Normal rate and regular rhythm.     Pulses: Normal pulses.     Heart sounds: No murmur heard.   No friction rub. No gallop.  Pulmonary:     Effort: No respiratory distress.     Breath sounds: No wheezing, rhonchi or rales.  Abdominal:     Palpations: Abdomen is soft.     Tenderness: There is no abdominal tenderness. There is no right CVA tenderness or left CVA tenderness.  Musculoskeletal:     Right lower leg: No edema.     Left lower  leg: No edema.  Skin:    General: Skin is warm and dry.  Neurological:     Mental Status: He is alert.  Psychiatric:        Mood and Affect: Mood normal.    ED Results / Procedures / Treatments   Labs (all labs ordered are listed, but only abnormal results are displayed) Labs Reviewed  CBC WITH DIFFERENTIAL/PLATELET - Abnormal; Notable for the following components:      Result Value   Platelets 85 (*)    All other  components within normal limits  COMPREHENSIVE METABOLIC PANEL - Abnormal; Notable for the following components:   Sodium 133 (*)    Glucose, Bld 253 (*)    Calcium 8.7 (*)    Albumin 3.4 (*)    All other components within normal limits  CULTURE, BLOOD (ROUTINE X 2)  CULTURE, BLOOD (ROUTINE X 2)  RESP PANEL BY RT-PCR (FLU A&B, COVID) ARPGX2  LACTIC ACID, PLASMA    EKG None  Radiology DG Chest Port 1 View  Result Date: 09/03/2021 CLINICAL DATA:  Cough, fever and shortness of breath. EXAM: PORTABLE CHEST 1 VIEW COMPARISON:  July 27, 2020 FINDINGS: Multiple sternal wires and vascular clips are seen. Very mild atelectasis is noted within the bilateral lung bases. There is no evidence of acute infiltrate, pleural effusion or pneumothorax. The heart size and mediastinal contours are within normal limits. A coronary artery stent is in place. Degenerative changes seen throughout the thoracic spine. IMPRESSION: 1. Evidence of prior median sternotomy/CABG. 2. Very mild bibasilar atelectasis. Electronically Signed   By: Virgina Norfolk M.D.   On: 09/03/2021 19:35    Procedures Procedures   Medications Ordered in ED Medications  azithromycin (ZITHROMAX) tablet 500 mg (has no administration in time range)  ondansetron (ZOFRAN-ODT) disintegrating tablet 4 mg (has no administration in time range)  prochlorperazine (COMPAZINE) tablet 10 mg (10 mg Oral Given 09/03/21 2037)    ED Course  I have reviewed the triage vital signs and the nursing notes.  Pertinent labs & imaging results that were available during my care of the patient were reviewed by me and considered in my medical decision making (see chart for details).    MDM Rules/Calculators/A&P                          Initial impression-presents with URI-like symptoms.  He is alert, does not appear in distress, vital signs reassuring on my exam.  Seen in triage concern for possible sepsis sepsis work-up was obtained.  Work-up-CBC  unremarkable, CMP shows sodium of 133, glucose 253, calcium 8.7, albumin 3.4, respiratory panel negative, chest x-ray reveals evidence of prior CABG, very mild bibasilar atelectasis present.  Reassessment-notified that patient having a slight headache, will provide him with Compazine and reassess.  Patient was updated on lab work and imaging, has no complaints this time, is agreeable for discharge.  Rule out- Low suspicion for systemic infection as patient is nontoxic-appearing, vital signs reassuring, no obvious source infection noted on exam.  Low suspicion for pneumonia as lung sounds are clear bilaterally, x-ray did not reveal any acute findings.  I have low suspicion for PE as patient denies pleuritic chest pain, shortness of breath, vital signs reassuring, nontachypneic, nonhypoxic, presentation atypical of etiology. low suspicion for strep throat as oropharynx was visualized, no erythema or exudates noted.  Low suspicion patient would need  hospitalized due to viral infection or Covid as vital signs reassuring, patient  is not in respiratory distress.    Plan-  URI-likely patient has a viral infection, due to his age and comorbidities I do not find it unreasonable for treatment of an atypical pneumonia.  will start him on azithromycin, follow-up with PCP for further evaluation.  Given strict return precautions.  Vital signs have remained stable, no indication for hospital admission.   Patient given at home care as well strict return precautions.  Patient verbalized that they understood agreed to said plan.  Final Clinical Impression(s) / ED Diagnoses Final diagnoses:  Viral illness    Rx / DC Orders ED Discharge Orders          Ordered    azithromycin (ZITHROMAX) 250 MG tablet  Daily        09/03/21 2110    ondansetron (ZOFRAN) 4 MG tablet  Every 8 hours PRN        09/03/21 2110             Aron Baba 09/03/21 2111    Milton Ferguson, MD 09/06/21 1156

## 2021-09-03 NOTE — ED Notes (Signed)
Pt has drank 1 liter of water by mouth- tolerated well

## 2021-09-03 NOTE — Discharge Instructions (Signed)
Likely you have a viral infection, I did start you on antibiotics to treat for possible atypical pneumonia or early pneumonia.  Please take as prescribed.   Zofran use needed for nausea.  I recommend staying hydrated, please drink plenty of fluids, if you do not have an appetite  recommend Gatorade's.  Take over-the-counter pain medication as needed.  Follow-up PCP for further evaluation.  Come back to the emergency department if you develop chest pain, shortness of breath, severe abdominal pain, uncontrolled nausea, vomiting, diarrhea.

## 2021-09-03 NOTE — ED Triage Notes (Addendum)
Pt. Arrived via POV with complaints of cough and fever. Per family pt. Was seen by PCP and diagnosed with URI. Family stated that pt. Started to run a fever and had only been running fevers at night. Family states that tylenol was given at 1530. Pt was advised by PCP to be seen at the ED. Pts. Son has similar symptoms.

## 2021-09-08 LAB — CULTURE, BLOOD (ROUTINE X 2)
Culture: NO GROWTH
Culture: NO GROWTH

## 2021-09-09 ENCOUNTER — Encounter (HOSPITAL_COMMUNITY): Payer: Self-pay

## 2021-09-09 ENCOUNTER — Other Ambulatory Visit: Payer: Self-pay

## 2021-09-09 ENCOUNTER — Inpatient Hospital Stay (HOSPITAL_COMMUNITY): Payer: Medicare HMO | Attending: Otolaryngology

## 2021-09-09 VITALS — BP 124/62 | HR 62 | Temp 97.7°F | Resp 18

## 2021-09-09 DIAGNOSIS — D696 Thrombocytopenia, unspecified: Secondary | ICD-10-CM | POA: Insufficient documentation

## 2021-09-09 DIAGNOSIS — D509 Iron deficiency anemia, unspecified: Secondary | ICD-10-CM | POA: Insufficient documentation

## 2021-09-09 DIAGNOSIS — E611 Iron deficiency: Secondary | ICD-10-CM

## 2021-09-09 MED ORDER — SODIUM CHLORIDE 0.9 % IV SOLN
300.0000 mg | Freq: Once | INTRAVENOUS | Status: AC
  Administered 2021-09-09: 300 mg via INTRAVENOUS
  Filled 2021-09-09: qty 300

## 2021-09-09 MED ORDER — SODIUM CHLORIDE 0.9 % IV SOLN: Freq: Once | INTRAVENOUS | Status: AC

## 2021-09-09 MED ORDER — ACETAMINOPHEN 325 MG PO TABS
650.0000 mg | ORAL_TABLET | Freq: Once | ORAL | Status: AC
  Administered 2021-09-09: 650 mg via ORAL
  Filled 2021-09-09: qty 2

## 2021-09-09 MED ORDER — FAMOTIDINE 20 MG PO TABS
20.0000 mg | ORAL_TABLET | Freq: Once | ORAL | Status: AC
  Administered 2021-09-09: 20 mg via ORAL
  Filled 2021-09-09: qty 1

## 2021-09-09 MED ORDER — LORATADINE 10 MG PO TABS
10.0000 mg | ORAL_TABLET | Freq: Once | ORAL | Status: AC
  Administered 2021-09-09: 10 mg via ORAL
  Filled 2021-09-09: qty 1

## 2021-09-09 NOTE — Progress Notes (Signed)
Tolerated venofer 300 mg infusion today without incidence.  Stable during and after infusion.  AVS reviewed. Vital signs stable prior to discharge.  Discharged in stable condition ambulatory.

## 2021-09-09 NOTE — Patient Instructions (Signed)
[facility]  Discharge Instructions: Thank you for choosing [facility] to provide your oncology and hematology care.  If you have a lab appointment with the [facility], please come in thru the Main Entrance and check in at the main information desk.  Wear comfortable clothing and clothing appropriate for easy access to any Portacath or PICC line.   We strive to give you quality time with your provider. You may need to reschedule your appointment if you arrive late (15 or more minutes).  Arriving late affects you and other patients whose appointments are after yours.  Also, if you miss three or more appointments without notifying the office, you may be dismissed from the clinic at the provider's discretion.      For prescription refill requests, have your pharmacy contact our office and allow 72 hours for refills to be completed.    Today you received the following chemotherapy and/or immunotherapy agents venofer 300 mg      To help prevent nausea and vomiting after your treatment, we encourage you to take your nausea medication as directed.  BELOW ARE SYMPTOMS THAT SHOULD BE REPORTED IMMEDIATELY: *FEVER GREATER THAN 100.4 F (38 C) OR HIGHER *CHILLS OR SWEATING *NAUSEA AND VOMITING THAT IS NOT CONTROLLED WITH YOUR NAUSEA MEDICATION *UNUSUAL SHORTNESS OF BREATH *UNUSUAL BRUISING OR BLEEDING *URINARY PROBLEMS (pain or burning when urinating, or frequent urination) *BOWEL PROBLEMS (unusual diarrhea, constipation, pain near the anus) TENDERNESS IN MOUTH AND THROAT WITH OR WITHOUT PRESENCE OF ULCERS (sore throat, sores in mouth, or a toothache) UNUSUAL RASH, SWELLING OR PAIN  UNUSUAL VAGINAL DISCHARGE OR ITCHING   Items with * indicate a potential emergency and should be followed up as soon as possible or go to the Emergency Department if any problems should occur.  Please show the CHEMOTHERAPY ALERT CARD or IMMUNOTHERAPY ALERT CARD at check-in to the Emergency  Department and triage nurse.  Should you have questions after your visit or need to cancel or reschedule your appointment, please contact [facility] [phone]  and follow the prompts.  Office hours are 8:00 a.m. to 4:30 p.m. Monday - Friday. Please note that voicemails left after 4:00 p.m. may not be returned until the following business day.  We are closed weekends and major holidays. You have access to a nurse at all times for urgent questions. Please call the main number to the clinic [phone] and follow the prompts.  For any non-urgent questions, you may also contact your provider using MyChart. We now offer e-Visits for anyone 32 and older to request care online for non-urgent symptoms. For details visit [email or web address].   Also download the MyChart app! Go to the app store, search "MyChart", open the app, select [facility], and log in with your MyChart username and password.  Due to Covid, a mask is required upon entering the hospital/clinic. If you do not have a mask, one will be given to you upon arrival. For doctor visits, patients may have 1 support person aged 42 or older with them. For treatment visits, patients cannot have anyone with them due to current Covid guidelines and our immunocompromised population.   Iron Sucrose Injection What is this medication? IRON SUCROSE (EYE ern SOO krose) treats low levels of iron (iron deficiency anemia) in people with kidney disease. Iron is a mineral that plays an important role in making red blood cells, which carry oxygen from your lungs to the rest of your body. This medicine may be  used for other purposes; ask your health care provider or pharmacist if you have questions. COMMON BRAND NAME(S): Venofer What should I tell my care team before I take this medication? They need to know if you have any of these conditions: Anemia not caused by low iron levels Heart disease High levels of iron in the blood Kidney  disease Liver disease An unusual or allergic reaction to iron, other medications, foods, dyes, or preservatives Pregnant or trying to get pregnant Breast-feeding How should I use this medication? This medication is for infusion into a vein. It is given in a hospital or clinic setting. Talk to your care team about the use of this medication in children. While this medication may be prescribed for children as young as 2 years for selected conditions, precautions do apply. Overdosage: If you think you have taken too much of this medicine contact a poison control center or emergency room at once. NOTE: This medicine is only for you. Do not share this medicine with others. What if I miss a dose? It is important not to miss your dose. Call your care team if you are unable to keep an appointment. What may interact with this medication? Do not take this medication with any of the following: Deferoxamine Dimercaprol Other iron products This medication may also interact with the following: Chloramphenicol Deferasirox This list may not describe all possible interactions. Give your health care provider a list of all the medicines, herbs, non-prescription drugs, or dietary supplements you use. Also tell them if you smoke, drink alcohol, or use illegal drugs. Some items may interact with your medicine. What should I watch for while using this medication? Visit your care team regularly. Tell your care team if your symptoms do not start to get better or if they get worse. You may need blood work done while you are taking this medication. You may need to follow a special diet. Talk to your care team. Foods that contain iron include: whole grains/cereals, dried fruits, beans, or peas, leafy green vegetables, and organ meats (liver, kidney). What side effects may I notice from receiving this medication? Side effects that you should report to your care team as soon as possible: Allergic reactions-skin rash,  itching, hives, swelling of the face, lips, tongue, or throat Low blood pressure-dizziness, feeling faint or lightheaded, blurry vision Shortness of breath Side effects that usually do not require medical attention (report to your care team if they continue or are bothersome): Flushing Headache Joint pain Muscle pain Nausea Pain, redness, or irritation at injection site This list may not describe all possible side effects. Call your doctor for medical advice about side effects. You may report side effects to FDA at 1-800-FDA-1088. Where should I keep my medication? This medication is given in a hospital or clinic and will not be stored at home. NOTE: This sheet is a summary. It may not cover all possible information. If you have questions about this medicine, talk to your doctor, pharmacist, or health care provider.  2022 Elsevier/Gold Standard (2021-01-21 12:52:06)

## 2021-09-19 ENCOUNTER — Other Ambulatory Visit: Payer: Self-pay

## 2021-09-19 ENCOUNTER — Inpatient Hospital Stay (HOSPITAL_COMMUNITY): Payer: Medicare HMO

## 2021-09-19 VITALS — BP 160/81 | HR 69 | Temp 97.8°F | Resp 18

## 2021-09-19 DIAGNOSIS — D509 Iron deficiency anemia, unspecified: Secondary | ICD-10-CM | POA: Diagnosis not present

## 2021-09-19 DIAGNOSIS — E611 Iron deficiency: Secondary | ICD-10-CM

## 2021-09-19 MED ORDER — SODIUM CHLORIDE 0.9 % IV SOLN
300.0000 mg | Freq: Once | INTRAVENOUS | Status: AC
  Administered 2021-09-19: 300 mg via INTRAVENOUS
  Filled 2021-09-19: qty 300

## 2021-09-19 MED ORDER — ACETAMINOPHEN 325 MG PO TABS
650.0000 mg | ORAL_TABLET | Freq: Once | ORAL | Status: AC
  Administered 2021-09-19: 650 mg via ORAL
  Filled 2021-09-19: qty 2

## 2021-09-19 MED ORDER — LORATADINE 10 MG PO TABS
10.0000 mg | ORAL_TABLET | Freq: Once | ORAL | Status: AC
  Administered 2021-09-19: 10 mg via ORAL
  Filled 2021-09-19: qty 1

## 2021-09-19 MED ORDER — FAMOTIDINE 20 MG PO TABS
20.0000 mg | ORAL_TABLET | Freq: Once | ORAL | Status: AC
  Administered 2021-09-19: 20 mg via ORAL
  Filled 2021-09-19: qty 1

## 2021-09-19 MED ORDER — SODIUM CHLORIDE 0.9 % IV SOLN: Freq: Once | INTRAVENOUS | Status: AC

## 2021-09-19 NOTE — Patient Instructions (Signed)
[facility]  Discharge Instructions: Thank you for choosing [facility] to provide your oncology and hematology care.  If you have a lab appointment with the [facility], please come in thru the Main Entrance and check in at the main information desk.  Wear comfortable clothing and clothing appropriate for easy access to any Portacath or PICC line.   We strive to give you quality time with your provider. You may need to reschedule your appointment if you arrive late (15 or more minutes).  Arriving late affects you and other patients whose appointments are after yours.  Also, if you miss three or more appointments without notifying the office, you may be dismissed from the clinic at the provider's discretion.      For prescription refill requests, have your pharmacy contact our office and allow 72 hours for refills to be completed.    Today you received Venofer IV iron infusion.     BELOW ARE SYMPTOMS THAT SHOULD BE REPORTED IMMEDIATELY: *FEVER GREATER THAN 100.4 F (38 C) OR HIGHER *CHILLS OR SWEATING *NAUSEA AND VOMITING THAT IS NOT CONTROLLED WITH YOUR NAUSEA MEDICATION *UNUSUAL SHORTNESS OF BREATH *UNUSUAL BRUISING OR BLEEDING *URINARY PROBLEMS (pain or burning when urinating, or frequent urination) *BOWEL PROBLEMS (unusual diarrhea, constipation, pain near the anus) TENDERNESS IN MOUTH AND THROAT WITH OR WITHOUT PRESENCE OF ULCERS (sore throat, sores in mouth, or a toothache) UNUSUAL RASH, SWELLING OR PAIN  UNUSUAL VAGINAL DISCHARGE OR ITCHING   Items with * indicate a potential emergency and should be followed up as soon as possible or go to the Emergency Department if any problems should occur.  Please show the CHEMOTHERAPY ALERT CARD or IMMUNOTHERAPY ALERT CARD at check-in to the Emergency Department and triage nurse.  Should you have questions after your visit or need to cancel or reschedule your appointment, please contact [facility]  [phone]  and follow the prompts.  Office hours are 8:00 a.m. to 4:30 p.m. Monday - Friday. Please note that voicemails left after 4:00 p.m. may not be returned until the following business day.  We are closed weekends and major holidays. You have access to a nurse at all times for urgent questions. Please call the main number to the clinic [phone] and follow the prompts.  For any non-urgent questions, you may also contact your provider using MyChart. We now offer e-Visits for anyone 62 and older to request care online for non-urgent symptoms. For details visit [email or web address].   Also download the MyChart app! Go to the app store, search "MyChart", open the app, select [facility], and log in with your MyChart username and password.  Due to Covid, a mask is required upon entering the hospital/clinic. If you do not have a mask, one will be given to you upon arrival. For doctor visits, patients may have 1 support person aged 54 or older with them. For treatment visits, patients cannot have anyone with them due to current Covid guidelines and our immunocompromised population.

## 2021-09-19 NOTE — Progress Notes (Signed)
Pt presents today for Venofer IV iron infusion per provider's order. Vital signs stable and pt voiced no new complaints at this time.  Peripheral IV started with good blood return pre and post infusion.  Venofer given today per MD orders. Tolerated infusion without adverse affects. Vital signs stable. No complaints at this time. Discharged from clinic ambulatory in stable condition. Alert and oriented x 3. F/U with [facility] as scheduled.

## 2021-10-16 ENCOUNTER — Other Ambulatory Visit (INDEPENDENT_AMBULATORY_CARE_PROVIDER_SITE_OTHER): Payer: Self-pay | Admitting: Gastroenterology

## 2021-10-16 NOTE — Telephone Encounter (Signed)
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 Please advise.

## 2021-10-16 NOTE — Telephone Encounter (Signed)
Will need follow up appointment for further refills, i'll refil for 3 months

## 2021-11-19 ENCOUNTER — Encounter (HOSPITAL_COMMUNITY): Payer: Self-pay | Admitting: Physical Therapy

## 2021-11-19 NOTE — Therapy (Signed)
[facility] [place], [place], [address] Phone: [phone]   Fax:  [phone]  Patient Details  Name: [name] [name] MRN: [id number] Date of Birth: 25-Feb-1956 Referring Provider:  No ref. provider found  Encounter Date: 11/19/2021  PHYSICAL THERAPY DISCHARGE SUMMARY  Visits from Start of Care: 3  Current functional level related to goals / functional outcomes: NA   Remaining deficits: NA   Education / Equipment: Patient DC per non return    Patient agrees to discharge. Patient goals were not met. Patient is being discharged due to not returning since the last visit.  11:50 AM, 11/19/21 Josue Hector PT DPT  Physical Therapist with [facility]  [phone] [place] [address] [place], [place], [address] Phone: [phone]   Fax:  [phone]

## 2021-12-12 ENCOUNTER — Emergency Department (HOSPITAL_COMMUNITY): Payer: Medicare Other

## 2021-12-12 ENCOUNTER — Encounter (HOSPITAL_COMMUNITY): Payer: Self-pay

## 2021-12-12 ENCOUNTER — Emergency Department (HOSPITAL_COMMUNITY)
Admission: EM | Admit: 2021-12-12 | Discharge: 2021-12-12 | Disposition: A | Discharge status: Home / Self Care | Payer: Medicare Other | Attending: Emergency Medicine | Admitting: Emergency Medicine

## 2021-12-12 ENCOUNTER — Other Ambulatory Visit: Payer: Self-pay

## 2021-12-12 ENCOUNTER — Encounter (HOSPITAL_COMMUNITY): Payer: Self-pay | Admitting: Hematology

## 2021-12-12 DIAGNOSIS — Z79899 Other long term (current) drug therapy: Secondary | ICD-10-CM | POA: Diagnosis not present

## 2021-12-12 DIAGNOSIS — R519 Headache, unspecified: Secondary | ICD-10-CM | POA: Diagnosis not present

## 2021-12-12 DIAGNOSIS — I2510 Atherosclerotic heart disease of native coronary artery without angina pectoris: Secondary | ICD-10-CM | POA: Diagnosis not present

## 2021-12-12 DIAGNOSIS — R918 Other nonspecific abnormal finding of lung field: Secondary | ICD-10-CM | POA: Diagnosis not present

## 2021-12-12 DIAGNOSIS — Z7982 Long term (current) use of aspirin: Secondary | ICD-10-CM | POA: Insufficient documentation

## 2021-12-12 DIAGNOSIS — Z7901 Long term (current) use of anticoagulants: Secondary | ICD-10-CM | POA: Diagnosis not present

## 2021-12-12 DIAGNOSIS — K429 Umbilical hernia without obstruction or gangrene: Secondary | ICD-10-CM | POA: Diagnosis not present

## 2021-12-12 DIAGNOSIS — G238 Other specified degenerative diseases of basal ganglia: Secondary | ICD-10-CM | POA: Diagnosis not present

## 2021-12-12 DIAGNOSIS — Y92410 Unspecified street and highway as the place of occurrence of the external cause: Secondary | ICD-10-CM | POA: Insufficient documentation

## 2021-12-12 DIAGNOSIS — S0990XA Unspecified injury of head, initial encounter: Secondary | ICD-10-CM | POA: Diagnosis not present

## 2021-12-12 DIAGNOSIS — G9389 Other specified disorders of brain: Secondary | ICD-10-CM | POA: Diagnosis not present

## 2021-12-12 DIAGNOSIS — S299XXA Unspecified injury of thorax, initial encounter: Secondary | ICD-10-CM | POA: Diagnosis not present

## 2021-12-12 DIAGNOSIS — M47812 Spondylosis without myelopathy or radiculopathy, cervical region: Secondary | ICD-10-CM | POA: Diagnosis not present

## 2021-12-12 DIAGNOSIS — R161 Splenomegaly, not elsewhere classified: Secondary | ICD-10-CM | POA: Diagnosis not present

## 2021-12-12 DIAGNOSIS — M19012 Primary osteoarthritis, left shoulder: Secondary | ICD-10-CM | POA: Diagnosis not present

## 2021-12-12 DIAGNOSIS — K760 Fatty (change of) liver, not elsewhere classified: Secondary | ICD-10-CM | POA: Diagnosis not present

## 2021-12-12 DIAGNOSIS — M25512 Pain in left shoulder: Secondary | ICD-10-CM | POA: Insufficient documentation

## 2021-12-12 DIAGNOSIS — M546 Pain in thoracic spine: Secondary | ICD-10-CM | POA: Diagnosis not present

## 2021-12-12 DIAGNOSIS — M549 Dorsalgia, unspecified: Secondary | ICD-10-CM | POA: Insufficient documentation

## 2021-12-12 DIAGNOSIS — S3991XA Unspecified injury of abdomen, initial encounter: Secondary | ICD-10-CM | POA: Diagnosis not present

## 2021-12-12 DIAGNOSIS — M2578 Osteophyte, vertebrae: Secondary | ICD-10-CM | POA: Diagnosis not present

## 2021-12-12 DIAGNOSIS — R0789 Other chest pain: Secondary | ICD-10-CM | POA: Diagnosis not present

## 2021-12-12 DIAGNOSIS — M545 Low back pain, unspecified: Secondary | ICD-10-CM | POA: Diagnosis not present

## 2021-12-12 DIAGNOSIS — K439 Ventral hernia without obstruction or gangrene: Secondary | ICD-10-CM | POA: Diagnosis not present

## 2021-12-12 LAB — CBC WITH DIFFERENTIAL/PLATELET
Abs Immature Granulocytes: 0 10*3/uL (ref 0.00–0.07)
Basophils Absolute: 0 10*3/uL (ref 0.0–0.1)
Basophils Relative: 1 %
Eosinophils Absolute: 0.1 10*3/uL (ref 0.0–0.5)
Eosinophils Relative: 3 %
HCT: 40.2 % (ref 39.0–52.0)
Hemoglobin: 14.3 g/dL (ref 13.0–17.0)
Immature Granulocytes: 0 %
Lymphocytes Relative: 35 %
Lymphs Abs: 0.7 10*3/uL (ref 0.7–4.0)
MCH: 32.1 pg (ref 26.0–34.0)
MCHC: 35.6 g/dL (ref 30.0–36.0)
MCV: 90.1 fL (ref 80.0–100.0)
Monocytes Absolute: 0.3 10*3/uL (ref 0.1–1.0)
Monocytes Relative: 16 %
Neutro Abs: 0.9 10*3/uL — ABNORMAL LOW (ref 1.7–7.7)
Neutrophils Relative %: 45 %
Platelets: 68 10*3/uL — ABNORMAL LOW (ref 150–400)
RBC: 4.46 MIL/uL (ref 4.22–5.81)
RDW: 13.2 % (ref 11.5–15.5)
WBC: 1.9 10*3/uL — ABNORMAL LOW (ref 4.0–10.5)
nRBC: 0 % (ref 0.0–0.2)

## 2021-12-12 LAB — BASIC METABOLIC PANEL
Anion gap: 7 (ref 5–15)
BUN: 15 mg/dL (ref 8–23)
CO2: 24 mmol/L (ref 22–32)
Calcium: 9.1 mg/dL (ref 8.9–10.3)
Chloride: 105 mmol/L (ref 98–111)
Creatinine, Ser: 0.93 mg/dL (ref 0.61–1.24)
GFR, Estimated: >60 mL/min (ref >60)
Glucose, Bld: 259 mg/dL — ABNORMAL HIGH (ref 70–99)
Potassium: 3.9 mmol/L (ref 3.5–5.1)
Sodium: 136 mmol/L (ref 135–145)

## 2021-12-12 MED ORDER — FENTANYL CITRATE PF 50 MCG/ML IJ SOSY
50.0000 ug | PREFILLED_SYRINGE | Freq: Once | INTRAMUSCULAR | Status: AC
  Administered 2021-12-12: 50 ug via INTRAVENOUS
  Filled 2021-12-12: qty 1

## 2021-12-12 MED ORDER — IOHEXOL 300 MG/ML  SOLN
100.0000 mL | Freq: Once | INTRAMUSCULAR | Status: AC | PRN
  Administered 2021-12-12: 100 mL via INTRAVENOUS

## 2021-12-12 MED ORDER — NAPROXEN 500 MG PO TABS: 500.0000 mg | ORAL_TABLET | Freq: Two times a day (BID) | ORAL | 0 refills | Status: DC

## 2021-12-12 NOTE — Discharge Instructions (Addendum)
Take naproxen twice daily for the next week.  He can also take Tylenol for breakthrough pain.  Follow-up with your primary if the pain persist for greater than 1 to 2 weeks.  Return to the ED if things change or worsen.
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
## 2021-12-12 NOTE — ED Notes (Signed)
Patient transported to CT 

## 2021-12-18 ENCOUNTER — Other Ambulatory Visit: Payer: Self-pay

## 2021-12-18 MED ORDER — ISOSORBIDE MONONITRATE ER 60 MG PO TB24: 60.0000 mg | ORAL_TABLET | Freq: Every day | ORAL | 1 refills | Status: DC

## 2022-01-07 ENCOUNTER — Other Ambulatory Visit: Payer: Self-pay

## 2022-01-07 ENCOUNTER — Emergency Department (HOSPITAL_COMMUNITY): Payer: Medicare Other

## 2022-01-07 ENCOUNTER — Emergency Department (HOSPITAL_COMMUNITY)
Admission: EM | Admit: 2022-01-07 | Discharge: 2022-01-07 | Disposition: A | Discharge status: Home / Self Care | Payer: Medicare Other | Attending: Emergency Medicine | Admitting: Emergency Medicine

## 2022-01-07 DIAGNOSIS — I2510 Atherosclerotic heart disease of native coronary artery without angina pectoris: Secondary | ICD-10-CM | POA: Insufficient documentation

## 2022-01-07 DIAGNOSIS — Z7984 Long term (current) use of oral hypoglycemic drugs: Secondary | ICD-10-CM | POA: Insufficient documentation

## 2022-01-07 DIAGNOSIS — M25512 Pain in left shoulder: Secondary | ICD-10-CM | POA: Diagnosis not present

## 2022-01-07 DIAGNOSIS — E119 Type 2 diabetes mellitus without complications: Secondary | ICD-10-CM | POA: Diagnosis not present

## 2022-01-07 DIAGNOSIS — M19012 Primary osteoarthritis, left shoulder: Secondary | ICD-10-CM | POA: Diagnosis not present

## 2022-01-07 DIAGNOSIS — Z7982 Long term (current) use of aspirin: Secondary | ICD-10-CM | POA: Diagnosis not present

## 2022-01-07 DIAGNOSIS — Z7902 Long term (current) use of antithrombotics/antiplatelets: Secondary | ICD-10-CM | POA: Diagnosis not present

## 2022-01-07 DIAGNOSIS — Z794 Long term (current) use of insulin: Secondary | ICD-10-CM | POA: Diagnosis not present

## 2022-01-07 MED ORDER — CYCLOBENZAPRINE HCL 10 MG PO TABS
10.0000 mg | ORAL_TABLET | Freq: Two times a day (BID) | ORAL | 0 refills | Status: DC | PRN
Start: 2022-01-07 — End: 2022-08-18

## 2022-01-07 MED ORDER — PREDNISONE 10 MG PO TABS: 30.0000 mg | ORAL_TABLET | Freq: Every day | ORAL | 0 refills | Status: AC
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
## 2022-01-07 NOTE — Discharge Instructions (Signed)
Imaging was reassuring, I suspect this might be muscular strain in your left shoulder and possible referred pain from your back, I have given you steroids as well as a muscle relaxers please take as prescribed. ? ?Please follow-up with neurosurgery as well as orthopedic surgery for further evaluation. ? ?Come back to the emergency department if you develop chest pain, shortness of breath, severe abdominal pain, uncontrolled nausea, vomiting, diarrhea. ? ?

## 2022-01-07 NOTE — ED Triage Notes (Signed)
Left shoulder pain since 12/10/2021 progressively worse since then. Pt seen at this ED for same prior. Pt does not have PCP. Pt difficult historian, wife answers most of questions for pt. ?

## 2022-01-07 NOTE — ED Notes (Signed)
DG shoulder not completed at this time ?

## 2022-01-13 DIAGNOSIS — R030 Elevated blood-pressure reading, without diagnosis of hypertension: Secondary | ICD-10-CM | POA: Diagnosis not present

## 2022-01-13 DIAGNOSIS — M5412 Radiculopathy, cervical region: Secondary | ICD-10-CM | POA: Diagnosis not present

## 2022-01-15 DIAGNOSIS — M4802 Spinal stenosis, cervical region: Secondary | ICD-10-CM | POA: Diagnosis not present

## 2022-01-15 DIAGNOSIS — M542 Cervicalgia: Secondary | ICD-10-CM | POA: Diagnosis not present

## 2022-01-15 DIAGNOSIS — M5412 Radiculopathy, cervical region: Secondary | ICD-10-CM | POA: Diagnosis not present

## 2022-01-21 DIAGNOSIS — M5412 Radiculopathy, cervical region: Secondary | ICD-10-CM | POA: Diagnosis not present

## 2022-02-06 ENCOUNTER — Ambulatory Visit: Payer: Medicaid Other | Admitting: Cardiology
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
## 2022-02-16 ENCOUNTER — Other Ambulatory Visit (INDEPENDENT_AMBULATORY_CARE_PROVIDER_SITE_OTHER): Payer: Self-pay | Admitting: Gastroenterology

## 2022-02-16 NOTE — Telephone Encounter (Signed)
Mitzie, Please schedule appt. Last seen 08/15/20 and dr Colman Cater note on 10/16/21 states he will refill for 3 months and then needs visit.  ?

## 2022-02-16 NOTE — Telephone Encounter (Signed)
Has appt 03/31/22. Last seen 08/15/20 ?

## 2022-02-23 DIAGNOSIS — M5412 Radiculopathy, cervical region: Secondary | ICD-10-CM | POA: Diagnosis not present

## 2022-02-25 ENCOUNTER — Inpatient Hospital Stay (HOSPITAL_COMMUNITY): Payer: Medicare Other | Attending: Hematology

## 2022-03-04 ENCOUNTER — Ambulatory Visit (HOSPITAL_COMMUNITY): Payer: Medicare HMO | Admitting: Physician Assistant

## 2022-03-18 DIAGNOSIS — M5412 Radiculopathy, cervical region: Secondary | ICD-10-CM | POA: Diagnosis not present

## 2022-03-31 ENCOUNTER — Encounter (INDEPENDENT_AMBULATORY_CARE_PROVIDER_SITE_OTHER): Payer: Self-pay | Admitting: Gastroenterology

## 2022-03-31 ENCOUNTER — Ambulatory Visit (INDEPENDENT_AMBULATORY_CARE_PROVIDER_SITE_OTHER): Payer: Medicaid Other | Admitting: Gastroenterology

## 2022-04-08 ENCOUNTER — Telehealth: Payer: Self-pay | Admitting: Cardiology

## 2022-04-08 ENCOUNTER — Telehealth: Payer: Self-pay | Admitting: *Deleted

## 2022-04-08 NOTE — Telephone Encounter (Signed)
   Pre-operative Risk Assessment    Patient Name: [name] [name]  DOB: 08-06-1956 MRN: [id number]      Request for Surgical Clearance    Procedure:   C6-7 LAMINECTOMY  Date of Surgery:  Clearance 04/09/22                                 Surgeon:  DR. Mallie Mussel POOL Surgeon's Group or Practice Name:  [place]  Phone number:  [phone] [place] [address] Fax number:  [phone]    Type of Clearance Requested:   - Medical  - Pharmacy:  Hold Aspirin and Clopidogrel (Plavix)     Type of Anesthesia:  General    Additional requests/questions:    Jiles Prows   04/08/2022, 10:11 AM

## 2022-04-08 NOTE — Telephone Encounter (Signed)
I was able to reach the pt after a few attempts. I did inform the pt that he has appt 04/10/22 @ 2 pm for pre op clearance with Bernerd Pho, PAC in the [place] office. Pt thanked me for the call and the help. I will update Lorriane Shire with Dr. Marchelle Folks office the pt has appt 04/10/22.

## 2022-04-08 NOTE — Telephone Encounter (Signed)
I will forward back to pre op provider for input as to Plavix and ASA if the pt should re-start these. I did send a staff message to [place] office to schedule an appt.

## 2022-04-08 NOTE — Telephone Encounter (Signed)
Pt called in regard to his surgery clearance. Pt has been made aware he is going to need an appt in the office for pre op clearance. Pt requested [place] office as this is closer to his home. Pt tells me that he has been holding his Plavix on his own x 1 week now and that he has been holding his ASA since the weekend. I advised the pt that he should never stop his blood thinners or any medications without speaking to his doctors. Pt aware his surgery has been cancelled until he is seen by cardiology. Pt agreeable to plan of care. I informed the pt that I am going to have the [place] office call him with an appt. Pt asked to have them please call the this ph# [phone]. Pt was grateful for our help and our concern in making sure he is safe to have his surgery. I will update the requesting office that we s/w the pt today and we will get him an appt.

## 2022-04-08 NOTE — Telephone Encounter (Signed)
I s/w Lorriane Shire with Dr. Mallie Mussel Pool's office in regard to the clearance notes faxed to our office today with the correct pt information. Looks like the wrong pt was entered in for clearance 03/31/22 under the name of Jabar Krysiak [birth year]b [birth year]. This clearance was entered in by Sung Amabile 03/31/22 @ 1:58 pm. I will need to s/w my supervisor in regard to wrong pt was entered in clearance, pre op provider Christen Bame, NP had contacted cardiologist for Katheran Awe for pre op clearance as the pt had just been seen.   The correct pt should be Mr. [name] dob July 28, 2056. I have confirmed this with Lorriane Shire at Dr. Marchelle Folks office. I have also confirmed the surgery information. In speaking with Lorriane Shire, I did state the pt is on both ASA and Plavix from what I can see on his med list. Lorriane Shire, stated the pt said he was only on ASA.   I read notes to Lorriane Shire from the pre op provider: plavix takes 5 days to leave the body, if his last dose is this morning, I doubt he can go through with the procedure tomorrow.   Per Lorriane Shire, she will update Dr. Annette Stable and they will call the pt and cancel his surgery until he has been cleared by cardiology.Lorriane Shire, stated she will let the pt know that we will be reach out to him with an appt.   I did also ask Lorriane Shire to please fax over the original clearance to me so that I may have that on file for documentation. Lorriane Shire will fax to [phone].

## 2022-04-10 ENCOUNTER — Encounter: Payer: Self-pay | Admitting: Student

## 2022-04-10 ENCOUNTER — Ambulatory Visit (INDEPENDENT_AMBULATORY_CARE_PROVIDER_SITE_OTHER): Payer: Medicare Other | Admitting: Student

## 2022-04-10 VITALS — BP 138/64 | HR 70 | Ht 69.0 in | Wt 224.0 lb

## 2022-04-10 DIAGNOSIS — E785 Hyperlipidemia, unspecified: Secondary | ICD-10-CM | POA: Diagnosis not present

## 2022-04-10 DIAGNOSIS — I2510 Atherosclerotic heart disease of native coronary artery without angina pectoris: Secondary | ICD-10-CM | POA: Diagnosis not present

## 2022-04-10 DIAGNOSIS — I10 Essential (primary) hypertension: Secondary | ICD-10-CM

## 2022-04-10 DIAGNOSIS — Z8673 Personal history of transient ischemic attack (TIA), and cerebral infarction without residual deficits: Secondary | ICD-10-CM

## 2022-04-10 DIAGNOSIS — Z01818 Encounter for other preprocedural examination: Secondary | ICD-10-CM

## 2022-04-10 DIAGNOSIS — I351 Nonrheumatic aortic (valve) insufficiency: Secondary | ICD-10-CM
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
## 2022-04-10 NOTE — Patient Instructions (Signed)
Medication Instructions: Your physician recommends that you continue on your current medications as directed. Please refer to the Current Medication list given to you today.   HOLD Aspirin 5 days before procedure     Labwork: None today  Testing/Procedures: None today   Follow-Up: 6 months  Any Other Special Instructions Will Be Listed Below (If Applicable).  If you need a refill on your cardiac medications before your next appointment, please call your pharmacy.

## 2022-04-13 ENCOUNTER — Other Ambulatory Visit: Payer: Self-pay | Admitting: Neurosurgery

## 2022-04-13 ENCOUNTER — Other Ambulatory Visit: Payer: Self-pay

## 2022-04-13 ENCOUNTER — Encounter (HOSPITAL_COMMUNITY): Payer: Self-pay | Admitting: Neurosurgery

## 2022-04-13 NOTE — Progress Notes (Signed)
[name] denies chest pain or shortness of breath. Patient denies having any s/s of Covid in her household.  Patient denies any known exposure to Covid.   [name] 's cardiologist is Dr. Rozann Lesches, Patient was seen by Bernerd Pho, PA for Dr. Domenic Polite on 04/10/22 for surgery clearance.  [name] has type II diabetes, patient reports that CBGs have been less than 200. I instructed patient to check CBG after awaking and every 2 hours until arrival  to the hospital.  I Instructed patient if CBG is less than 70 to take 4 Glucose Tablets or 1 tube of Glucose Gel or 1/2 cup of a clear juice. Recheck CBG in 15 minutes if CBG is not over 70 call, pre- op desk at [phone] for further instructions.

## 2022-04-16 ENCOUNTER — Ambulatory Visit (HOSPITAL_COMMUNITY): Payer: Medicare Other | Admitting: Certified Registered Nurse Anesthetist

## 2022-04-16 ENCOUNTER — Ambulatory Visit (HOSPITAL_COMMUNITY): Payer: Medicare Other

## 2022-04-16 ENCOUNTER — Encounter (HOSPITAL_COMMUNITY)
Admission: RE | Disposition: A | Discharge status: Home / Self Care | Payer: Self-pay | Source: Home / Self Care | Attending: Neurosurgery

## 2022-04-16 ENCOUNTER — Observation Stay (HOSPITAL_COMMUNITY)
Admission: RE | Admit: 2022-04-16 | Discharge: 2022-04-17 | Disposition: A | Discharge status: Home / Self Care | Payer: Medicare Other | Attending: Neurosurgery | Admitting: Neurosurgery

## 2022-04-16 ENCOUNTER — Encounter (HOSPITAL_COMMUNITY): Payer: Self-pay | Admitting: Neurosurgery

## 2022-04-16 ENCOUNTER — Other Ambulatory Visit: Payer: Self-pay

## 2022-04-16 ENCOUNTER — Ambulatory Visit (HOSPITAL_BASED_OUTPATIENT_CLINIC_OR_DEPARTMENT_OTHER): Payer: Medicare Other | Admitting: Certified Registered Nurse Anesthetist

## 2022-04-16 DIAGNOSIS — Z7982 Long term (current) use of aspirin: Secondary | ICD-10-CM | POA: Diagnosis not present

## 2022-04-16 DIAGNOSIS — E119 Type 2 diabetes mellitus without complications: Secondary | ICD-10-CM | POA: Insufficient documentation

## 2022-04-16 DIAGNOSIS — Z955 Presence of coronary angioplasty implant and graft: Secondary | ICD-10-CM | POA: Insufficient documentation

## 2022-04-16 DIAGNOSIS — I10 Essential (primary) hypertension: Secondary | ICD-10-CM | POA: Insufficient documentation

## 2022-04-16 DIAGNOSIS — I252 Old myocardial infarction: Secondary | ICD-10-CM

## 2022-04-16 DIAGNOSIS — I2510 Atherosclerotic heart disease of native coronary artery without angina pectoris: Secondary | ICD-10-CM | POA: Insufficient documentation

## 2022-04-16 DIAGNOSIS — M9981 Other biomechanical lesions of cervical region: Secondary | ICD-10-CM | POA: Diagnosis not present

## 2022-04-16 DIAGNOSIS — Z8673 Personal history of transient ischemic attack (TIA), and cerebral infarction without residual deficits: Secondary | ICD-10-CM | POA: Insufficient documentation

## 2022-04-16 DIAGNOSIS — M5412 Radiculopathy, cervical region: Secondary | ICD-10-CM | POA: Insufficient documentation

## 2022-04-16 DIAGNOSIS — I509 Heart failure, unspecified: Secondary | ICD-10-CM

## 2022-04-16 DIAGNOSIS — Z794 Long term (current) use of insulin: Secondary | ICD-10-CM | POA: Diagnosis not present

## 2022-04-16 DIAGNOSIS — M4802 Spinal stenosis, cervical region: Principal | ICD-10-CM | POA: Insufficient documentation

## 2022-04-16 DIAGNOSIS — Z79899 Other long term (current) drug therapy: Secondary | ICD-10-CM | POA: Diagnosis not present

## 2022-04-16 DIAGNOSIS — Z7984 Long term (current) use of oral hypoglycemic drugs: Secondary | ICD-10-CM | POA: Insufficient documentation

## 2022-04-16 DIAGNOSIS — I110 Hypertensive heart disease with heart failure: Secondary | ICD-10-CM | POA: Diagnosis not present

## 2022-04-16 DIAGNOSIS — Z951 Presence of aortocoronary bypass graft: Secondary | ICD-10-CM | POA: Insufficient documentation

## 2022-04-16 DIAGNOSIS — J45909 Unspecified asthma, uncomplicated: Secondary | ICD-10-CM | POA: Insufficient documentation

## 2022-04-16 HISTORY — PX: POSTERIOR CERVICAL LAMINECTOMY: SHX2248

## 2022-04-16 HISTORY — DX: Fatty (change of) liver, not elsewhere classified: K76.0

## 2022-04-16 LAB — SURGICAL PCR SCREEN
MRSA, PCR: NEGATIVE
Staphylococcus aureus: POSITIVE — AB

## 2022-04-16 LAB — CBC
HCT: 42.1 % (ref 39.0–52.0)
Hemoglobin: 15.7 g/dL (ref 13.0–17.0)
MCH: 32.9 pg (ref 26.0–34.0)
MCHC: 37.3 g/dL — ABNORMAL HIGH (ref 30.0–36.0)
MCV: 88.3 fL (ref 80.0–100.0)
Platelets: 78 10*3/uL — ABNORMAL LOW (ref 150–400)
RBC: 4.77 MIL/uL (ref 4.22–5.81)
RDW: 12.4 % (ref 11.5–15.5)
WBC: 2.9 10*3/uL — ABNORMAL LOW (ref 4.0–10.5)
nRBC: 0 % (ref 0.0–0.2)

## 2022-04-16 LAB — GLUCOSE, CAPILLARY
Glucose-Capillary: 229 mg/dL — ABNORMAL HIGH (ref 70–99)
Glucose-Capillary: 177 mg/dL — ABNORMAL HIGH (ref 70–99)
Glucose-Capillary: 185 mg/dL — ABNORMAL HIGH (ref 70–99)
Glucose-Capillary: 269 mg/dL — ABNORMAL HIGH (ref 70–99)
Glucose-Capillary: 365 mg/dL — ABNORMAL HIGH (ref 70–99)

## 2022-04-16 LAB — BASIC METABOLIC PANEL
Anion gap: 9 (ref 5–15)
BUN: 13 mg/dL (ref 8–23)
CO2: 21 mmol/L — ABNORMAL LOW (ref 22–32)
Calcium: 9.2 mg/dL (ref 8.9–10.3)
Chloride: 108 mmol/L (ref 98–111)
Creatinine, Ser: 0.92 mg/dL (ref 0.61–1.24)
GFR, Estimated: >60 mL/min (ref >60)
Glucose, Bld: 216 mg/dL — ABNORMAL HIGH (ref 70–99)
Potassium: 3.5 mmol/L (ref 3.5–5.1)
Sodium: 138 mmol/L (ref 135–145)

## 2022-04-16 LAB — TYPE AND SCREEN
ABO/RH(D): A POS
Antibody Screen: NEGATIVE

## 2022-04-16 LAB — HEMOGLOBIN A1C
Hgb A1c MFr Bld: 8.2 % — ABNORMAL HIGH (ref 4.8–5.6)
Mean Plasma Glucose: 188.64 mg/dL

## 2022-04-16 SURGERY — POSTERIOR CERVICAL LAMINECTOMY
Anesthesia: General | Laterality: Left

## 2022-04-16 MED ORDER — LACTATED RINGERS IV SOLN: INTRAVENOUS | Status: DC

## 2022-04-16 MED ORDER — MUPIROCIN 2 % EX OINT
1.0000 "application " | TOPICAL_OINTMENT | Freq: Two times a day (BID) | CUTANEOUS | Status: DC
  Administered 2022-04-16 – 2022-04-17 (×3): 1 via NASAL
  Filled 2022-04-16 (×2): qty 22

## 2022-04-16 MED ORDER — SUGAMMADEX SODIUM 200 MG/2ML IV SOLN
INTRAVENOUS | Status: DC | PRN
  Administered 2022-04-16: 400 mg via INTRAVENOUS

## 2022-04-16 MED ORDER — FENTANYL CITRATE (PF) 250 MCG/5ML IJ SOLN
INTRAMUSCULAR | Status: DC | PRN
  Administered 2022-04-16 (×3): 50 ug via INTRAVENOUS

## 2022-04-16 MED ORDER — FENTANYL CITRATE (PF) 250 MCG/5ML IJ SOLN
INTRAMUSCULAR | Status: AC
  Filled 2022-04-16: qty 5

## 2022-04-16 MED ORDER — BUPIVACAINE HCL (PF) 0.25 % IJ SOLN
INTRAMUSCULAR | Status: DC | PRN
  Administered 2022-04-16: 20 mL

## 2022-04-16 MED ORDER — DEXAMETHASONE SODIUM PHOSPHATE 10 MG/ML IJ SOLN
INTRAMUSCULAR | Status: DC | PRN
  Administered 2022-04-16: 10 mg via INTRAVENOUS

## 2022-04-16 MED ORDER — LACTATED RINGERS IV SOLN
INTRAVENOUS | Status: DC
Start: 2022-04-16 — End: 2022-04-16

## 2022-04-16 MED ORDER — MENTHOL 3 MG MT LOZG: 1.0000 | LOZENGE | OROMUCOSAL | Status: DC | PRN

## 2022-04-16 MED ORDER — HYDROCODONE-ACETAMINOPHEN 5-325 MG PO TABS: 1.0000 | ORAL_TABLET | ORAL | Status: DC | PRN

## 2022-04-16 MED ORDER — PHENOL 1.4 % MT LIQD: 1.0000 | OROMUCOSAL | Status: DC | PRN

## 2022-04-16 MED ORDER — KETOROLAC TROMETHAMINE 30 MG/ML IJ SOLN
INTRAMUSCULAR | Status: DC | PRN
  Administered 2022-04-16: 30 mg via INTRAVENOUS

## 2022-04-16 MED ORDER — MEPERIDINE HCL 25 MG/ML IJ SOLN: 6.2500 mg | INTRAMUSCULAR | Status: DC | PRN

## 2022-04-16 MED ORDER — THROMBIN 5000 UNITS EX SOLR
OROMUCOSAL | Status: DC | PRN
  Administered 2022-04-16: 5 mL via TOPICAL

## 2022-04-16 MED ORDER — 0.9 % SODIUM CHLORIDE (POUR BTL) OPTIME
TOPICAL | Status: DC | PRN
  Administered 2022-04-16: 1000 mL

## 2022-04-16 MED ORDER — CEFAZOLIN SODIUM-DEXTROSE 2-4 GM/100ML-% IV SOLN
2.0000 g | INTRAVENOUS | Status: AC
  Administered 2022-04-16: 2 g via INTRAVENOUS

## 2022-04-16 MED ORDER — ONDANSETRON HCL 4 MG/2ML IJ SOLN
INTRAMUSCULAR | Status: AC
  Filled 2022-04-16: qty 2

## 2022-04-16 MED ORDER — BUPIVACAINE HCL (PF) 0.25 % IJ SOLN
INTRAMUSCULAR | Status: AC
  Filled 2022-04-16: qty 30

## 2022-04-16 MED ORDER — PROPOFOL 10 MG/ML IV BOLUS
INTRAVENOUS | Status: DC | PRN
  Administered 2022-04-16: 50 mg via INTRAVENOUS
  Administered 2022-04-16: 150 mg via INTRAVENOUS

## 2022-04-16 MED ORDER — EPHEDRINE SULFATE-NACL 50-0.9 MG/10ML-% IV SOSY
PREFILLED_SYRINGE | INTRAVENOUS | Status: DC | PRN
  Administered 2022-04-16 (×2): 10 mg via INTRAVENOUS

## 2022-04-16 MED ORDER — SODIUM CHLORIDE 0.9% FLUSH: 3.0000 mL | INTRAVENOUS | Status: DC | PRN

## 2022-04-16 MED ORDER — CHLORHEXIDINE GLUCONATE CLOTH 2 % EX PADS: 6.0000 | MEDICATED_PAD | Freq: Once | CUTANEOUS | Status: DC

## 2022-04-16 MED ORDER — ROCURONIUM BROMIDE 10 MG/ML (PF) SYRINGE
PREFILLED_SYRINGE | INTRAVENOUS | Status: DC | PRN
  Administered 2022-04-16: 20 mg via INTRAVENOUS
  Administered 2022-04-16: 40 mg via INTRAVENOUS
  Administered 2022-04-16: 60 mg via INTRAVENOUS

## 2022-04-16 MED ORDER — HYDROMORPHONE HCL 1 MG/ML IJ SOLN: 1.0000 mg | INTRAMUSCULAR | Status: DC | PRN

## 2022-04-16 MED ORDER — CEFAZOLIN SODIUM-DEXTROSE 1-4 GM/50ML-% IV SOLN
1.0000 g | Freq: Three times a day (TID) | INTRAVENOUS | Status: AC
  Administered 2022-04-16 (×2): 1 g via INTRAVENOUS
  Filled 2022-04-16 (×2): qty 50

## 2022-04-16 MED ORDER — INSULIN ASPART 100 UNIT/ML IJ SOLN: 0.0000 [IU] | INTRAMUSCULAR | Status: DC | PRN

## 2022-04-16 MED ORDER — CYCLOBENZAPRINE HCL 10 MG PO TABS
10.0000 mg | ORAL_TABLET | Freq: Three times a day (TID) | ORAL | Status: DC | PRN
  Administered 2022-04-16 (×2): 10 mg via ORAL
  Filled 2022-04-16 (×2): qty 1

## 2022-04-16 MED ORDER — CEFAZOLIN SODIUM-DEXTROSE 2-4 GM/100ML-% IV SOLN
INTRAVENOUS | Status: AC
  Filled 2022-04-16: qty 100

## 2022-04-16 MED ORDER — KETOROLAC TROMETHAMINE 15 MG/ML IJ SOLN
15.0000 mg | Freq: Four times a day (QID) | INTRAMUSCULAR | Status: AC
  Administered 2022-04-16 – 2022-04-17 (×4): 15 mg via INTRAVENOUS
  Filled 2022-04-16 (×4): qty 1

## 2022-04-16 MED ORDER — LIDOCAINE 2% (20 MG/ML) 5 ML SYRINGE
INTRAMUSCULAR | Status: AC
  Filled 2022-04-16: qty 5

## 2022-04-16 MED ORDER — THROMBIN 5000 UNITS EX SOLR
CUTANEOUS | Status: AC
  Filled 2022-04-16: qty 10000

## 2022-04-16 MED ORDER — ROCURONIUM BROMIDE 10 MG/ML (PF) SYRINGE
PREFILLED_SYRINGE | INTRAVENOUS | Status: AC
  Filled 2022-04-16: qty 10

## 2022-04-16 MED ORDER — ACETAMINOPHEN 160 MG/5ML PO SOLN: 325.0000 mg | Freq: Once | ORAL | Status: DC | PRN

## 2022-04-16 MED ORDER — BACITRACIN ZINC 500 UNIT/GM EX OINT
TOPICAL_OINTMENT | CUTANEOUS | Status: AC
  Filled 2022-04-16: qty 28.35

## 2022-04-16 MED ORDER — SODIUM CHLORIDE 0.9% FLUSH
3.0000 mL | Freq: Two times a day (BID) | INTRAVENOUS | Status: DC
  Administered 2022-04-16 – 2022-04-17 (×3): 3 mL via INTRAVENOUS

## 2022-04-16 MED ORDER — AMISULPRIDE (ANTIEMETIC) 5 MG/2ML IV SOLN: 10.0000 mg | Freq: Once | INTRAVENOUS | Status: DC | PRN

## 2022-04-16 MED ORDER — INSULIN ASPART 100 UNIT/ML IJ SOLN
0.0000 [IU] | Freq: Three times a day (TID) | INTRAMUSCULAR | Status: DC
  Administered 2022-04-17: 8 [IU] via SUBCUTANEOUS

## 2022-04-16 MED ORDER — HYDROCODONE-ACETAMINOPHEN 10-325 MG PO TABS
2.0000 | ORAL_TABLET | ORAL | Status: DC | PRN
  Administered 2022-04-16 – 2022-04-17 (×4): 2 via ORAL
  Filled 2022-04-16 (×4): qty 2

## 2022-04-16 MED ORDER — BACITRACIN ZINC 500 UNIT/GM EX OINT
TOPICAL_OINTMENT | CUTANEOUS | Status: DC | PRN
  Administered 2022-04-16: 1 via TOPICAL

## 2022-04-16 MED ORDER — ONDANSETRON HCL 4 MG PO TABS: 4.0000 mg | ORAL_TABLET | Freq: Four times a day (QID) | ORAL | Status: DC | PRN

## 2022-04-16 MED ORDER — PHENYLEPHRINE 80 MCG/ML (10ML) SYRINGE FOR IV PUSH (FOR BLOOD PRESSURE SUPPORT)
PREFILLED_SYRINGE | INTRAVENOUS | Status: DC | PRN
  Administered 2022-04-16: 160 ug via INTRAVENOUS

## 2022-04-16 MED ORDER — SODIUM CHLORIDE 0.9 % IV SOLN: 250.0000 mL | INTRAVENOUS | Status: DC

## 2022-04-16 MED ORDER — ACETAMINOPHEN 325 MG PO TABS: 650.0000 mg | ORAL_TABLET | ORAL | Status: DC | PRN

## 2022-04-16 MED ORDER — ACETAMINOPHEN 650 MG RE SUPP: 650.0000 mg | RECTAL | Status: DC | PRN

## 2022-04-16 MED ORDER — MIDAZOLAM HCL 2 MG/2ML IJ SOLN
INTRAMUSCULAR | Status: DC | PRN
  Administered 2022-04-16: 2 mg via INTRAVENOUS

## 2022-04-16 MED ORDER — LIDOCAINE 2% (20 MG/ML) 5 ML SYRINGE
INTRAMUSCULAR | Status: DC | PRN
  Administered 2022-04-16: 60 mg via INTRAVENOUS

## 2022-04-16 MED ORDER — ACETAMINOPHEN 10 MG/ML IV SOLN: 1000.0000 mg | Freq: Once | INTRAVENOUS | Status: DC | PRN

## 2022-04-16 MED ORDER — ONDANSETRON HCL 4 MG/2ML IJ SOLN: 4.0000 mg | Freq: Four times a day (QID) | INTRAMUSCULAR | Status: DC | PRN

## 2022-04-16 MED ORDER — DEXAMETHASONE SODIUM PHOSPHATE 10 MG/ML IJ SOLN
INTRAMUSCULAR | Status: AC
  Filled 2022-04-16: qty 1

## 2022-04-16 MED ORDER — ORAL CARE MOUTH RINSE: 15.0000 mL | Freq: Once | OROMUCOSAL | Status: AC

## 2022-04-16 MED ORDER — CHLORHEXIDINE GLUCONATE 0.12 % MT SOLN: 15.0000 mL | Freq: Once | OROMUCOSAL | Status: AC

## 2022-04-16 MED ORDER — HYDROMORPHONE HCL 1 MG/ML IJ SOLN: 0.2500 mg | INTRAMUSCULAR | Status: DC | PRN

## 2022-04-16 MED ORDER — CHLORHEXIDINE GLUCONATE 0.12 % MT SOLN
OROMUCOSAL | Status: AC
  Administered 2022-04-16: 15 mL via OROMUCOSAL
  Filled 2022-04-16: qty 15

## 2022-04-16 MED ORDER — CHLORHEXIDINE GLUCONATE 0.12 % MT SOLN: 15.0000 mL | OROMUCOSAL | Status: AC

## 2022-04-16 MED ORDER — ACETAMINOPHEN 325 MG PO TABS: 325.0000 mg | ORAL_TABLET | Freq: Once | ORAL | Status: DC | PRN

## 2022-04-16 MED ORDER — THROMBIN 5000 UNITS EX SOLR
CUTANEOUS | Status: AC
  Filled 2022-04-16: qty 5000

## 2022-04-16 MED ORDER — INSULIN ASPART 100 UNIT/ML IJ SOLN
0.0000 [IU] | Freq: Every day | INTRAMUSCULAR | Status: DC
  Administered 2022-04-16: 5 [IU] via SUBCUTANEOUS

## 2022-04-16 MED ORDER — PROPOFOL 10 MG/ML IV BOLUS
INTRAVENOUS | Status: AC
  Filled 2022-04-16: qty 20

## 2022-04-16 MED ORDER — MIDAZOLAM HCL 2 MG/2ML IJ SOLN
INTRAMUSCULAR | Status: AC
  Filled 2022-04-16: qty 2

## 2022-04-16 MED ORDER — CHLORHEXIDINE GLUCONATE CLOTH 2 % EX PADS
6.0000 | MEDICATED_PAD | Freq: Every day | CUTANEOUS | Status: DC
  Administered 2022-04-17: 6 via TOPICAL

## 2022-04-16 MED ORDER — THROMBIN 5000 UNITS EX SOLR
CUTANEOUS | Status: DC | PRN
  Administered 2022-04-16: 10000 [IU] via TOPICAL

## 2022-04-16 MED ORDER — INSULIN ASPART 100 UNIT/ML IJ SOLN
INTRAMUSCULAR | Status: AC
  Administered 2022-04-16: 6 [IU] via SUBCUTANEOUS
  Filled 2022-04-16: qty 1

## 2022-04-16 MED ORDER — ONDANSETRON HCL 4 MG/2ML IJ SOLN
INTRAMUSCULAR | Status: DC | PRN
  Administered 2022-04-16: 4 mg via INTRAVENOUS

## 2022-04-16 MED ORDER — PHENYLEPHRINE HCL-NACL 20-0.9 MG/250ML-% IV SOLN
INTRAVENOUS | Status: DC | PRN
  Administered 2022-04-16: 40 ug/min via INTRAVENOUS

## 2022-04-16 SURGICAL SUPPLY — 52 items
ADH SKN CLS APL DERMABOND .7 (GAUZE/BANDAGES/DRESSINGS) ×1
APL SKNCLS STERI-STRIP NONHPOA (GAUZE/BANDAGES/DRESSINGS) ×1
BAG COUNTER SPONGE SURGICOUNT (BAG) ×3 IMPLANT
BAG DECANTER FOR FLEXI CONT (MISCELLANEOUS) ×3 IMPLANT
BAG SPNG CNTER NS LX DISP (BAG) ×1
BAND INSRT 18 STRL LF DISP RB (MISCELLANEOUS)
BAND RUBBER #18 3X1/16 STRL (MISCELLANEOUS) IMPLANT
BENZOIN TINCTURE PRP APPL 2/3 (GAUZE/BANDAGES/DRESSINGS) ×3 IMPLANT
BLADE CLIPPER SURG (BLADE) IMPLANT
BUR MATCHSTICK NEURO 3.0 LAGG (BURR) ×3 IMPLANT
CANISTER SUCT 3000ML PPV (MISCELLANEOUS) ×3 IMPLANT
CARTRIDGE OIL MAESTRO DRILL (MISCELLANEOUS) ×2 IMPLANT
DERMABOND ADVANCED (GAUZE/BANDAGES/DRESSINGS) ×1
DERMABOND ADVANCED .7 DNX12 (GAUZE/BANDAGES/DRESSINGS) ×2 IMPLANT
DIFFUSER DRILL AIR PNEUMATIC (MISCELLANEOUS) ×3 IMPLANT
DRAPE C-ARM 42X72 X-RAY (DRAPES) ×2 IMPLANT
DRAPE LAPAROTOMY 100X72 PEDS (DRAPES) ×3 IMPLANT
DRAPE MICROSCOPE LEICA (MISCELLANEOUS) ×1 IMPLANT
DURAPREP 26ML APPLICATOR (WOUND CARE) ×3 IMPLANT
ELECT REM PT RETURN 9FT ADLT (ELECTROSURGICAL) ×2
ELECTRODE REM PT RTRN 9FT ADLT (ELECTROSURGICAL) ×2 IMPLANT
GAUZE 4X4 16PLY ~~LOC~~+RFID DBL (SPONGE) IMPLANT
GAUZE SPONGE 4X4 12PLY STRL (GAUZE/BANDAGES/DRESSINGS) ×3 IMPLANT
GLOVE ECLIPSE 9.0 STRL (GLOVE) ×3 IMPLANT
GLOVE EXAM NITRILE XL STR (GLOVE) IMPLANT
GOWN STRL REUS W/ TWL LRG LVL3 (GOWN DISPOSABLE) IMPLANT
GOWN STRL REUS W/ TWL XL LVL3 (GOWN DISPOSABLE) ×2 IMPLANT
GOWN STRL REUS W/TWL 2XL LVL3 (GOWN DISPOSABLE) IMPLANT
GOWN STRL REUS W/TWL LRG LVL3 (GOWN DISPOSABLE)
GOWN STRL REUS W/TWL XL LVL3 (GOWN DISPOSABLE) ×2
HEMOSTAT POWDER SURGIFOAM 1G (HEMOSTASIS) ×1 IMPLANT
KIT BASIN OR (CUSTOM PROCEDURE TRAY) ×3 IMPLANT
KIT TURNOVER KIT B (KITS) ×3 IMPLANT
NDL SPNL 22GX3.5 QUINCKE BK (NEEDLE) ×2 IMPLANT
NEEDLE HYPO 22GX1.5 SAFETY (NEEDLE) ×3 IMPLANT
NEEDLE SPNL 22GX3.5 QUINCKE BK (NEEDLE) ×2 IMPLANT
NS IRRIG 1000ML POUR BTL (IV SOLUTION) ×3 IMPLANT
OIL CARTRIDGE MAESTRO DRILL (MISCELLANEOUS) ×2
PACK LAMINECTOMY NEURO (CUSTOM PROCEDURE TRAY) ×3 IMPLANT
PAD ARMBOARD 7.5X6 YLW CONV (MISCELLANEOUS) ×9 IMPLANT
PIN MAYFIELD SKULL DISP (PIN) ×3 IMPLANT
SPONGE SURGIFOAM ABS GEL SZ50 (HEMOSTASIS) ×3 IMPLANT
SPONGE T-LAP 4X18 ~~LOC~~+RFID (SPONGE) IMPLANT
STRIP CLOSURE SKIN 1/2X4 (GAUZE/BANDAGES/DRESSINGS) ×3 IMPLANT
STRIP CLOSURE SKIN 1/4X4 (GAUZE/BANDAGES/DRESSINGS) ×1 IMPLANT
SUT VIC AB 0 CT1 18XCR BRD8 (SUTURE) ×2 IMPLANT
SUT VIC AB 0 CT1 8-18 (SUTURE) ×2
SUT VIC AB 2-0 CT1 18 (SUTURE) ×3 IMPLANT
SUT VIC AB 3-0 SH 8-18 (SUTURE) ×3 IMPLANT
TOWEL GREEN STERILE (TOWEL DISPOSABLE) ×3 IMPLANT
TOWEL GREEN STERILE FF (TOWEL DISPOSABLE) ×3 IMPLANT
WATER STERILE IRR 1000ML POUR (IV SOLUTION) ×3 IMPLANT

## 2022-04-16 NOTE — Evaluation (Signed)
Occupational Therapy Evaluation Patient Details Name: [name] [name] MRN: [id number] DOB: [birth year] Today's Date: 04/16/2022   History of Present Illness 66 yo M s/p Laminectomy and Foraminotomy - left - C6-C7.  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   Clinical Impression   Patient admitted for the procedure above.  PTA he lives with his spouse, and needed no assist with any aspect of ADL/iADL or mobility.  Discomfort noted to surgical area, but he is at his baseline for ADL completion, and mobility.  No further OT needs in the acute setting.  Patient will have needed assist.  All questions answered, and precaution sheet issued.        Recommendations for follow up therapy are one component of a multi-disciplinary discharge planning process, led by the attending physician.  Recommendations may be updated based on patient status, additional functional criteria and insurance authorization.   Follow Up Recommendations  No OT follow up    Assistance Recommended at Discharge PRN  Patient can return home with the following      Functional Status Assessment  Patient has not had a recent decline in their functional status  Equipment Recommendations  None recommended by OT    Recommendations for Other Services       Precautions / Restrictions Precautions Precautions: Cervical Precaution Booklet Issued: Yes (comment) Restrictions Weight Bearing Restrictions: No Other Position/Activity Restrictions: No brace needed      Mobility Bed Mobility Overal bed mobility: Modified Independent                  Transfers Overall transfer level: Independent                        Balance Overall balance assessment: No apparent balance deficits (not formally assessed)                                         ADL either performed or assessed with clinical judgement   ADL Overall ADL's : At baseline                                              Vision Patient Visual Report: No change from baseline       Perception Perception Perception: Within Functional Limits   Praxis Praxis Praxis: Intact    Pertinent Vitals/Pain Pain Assessment Pain Assessment: Faces Faces Pain Scale: Hurts a little bit Pain Location: incision site Pain Descriptors / Indicators: Tender Pain Intervention(s): Monitored during session     Hand Dominance Right   Extremity/Trunk Assessment Upper Extremity Assessment Upper Extremity Assessment: Overall WFL for tasks assessed   Lower Extremity Assessment Lower Extremity Assessment: Overall WFL for tasks assessed   Cervical / Trunk Assessment Cervical / Trunk Assessment: Neck Surgery   Communication Communication Communication: No difficulties   Cognition Arousal/Alertness: Awake/alert Behavior During Therapy: WFL for tasks assessed/performed Overall Cognitive Status: Within Functional Limits for tasks assessed                                       General Comments   VSS on RA    Exercises     Shoulder  Instructions      Home Living Family/patient expects to be discharged to:: Private residence Living Arrangements: Spouse/significant other Available Help at Discharge: Family;Available 24 hours/day Type of Home: House Home Access: Level entry     Home Layout: One level     Bathroom Shower/Tub: Teacher, early years/pre: Standard Bathroom Accessibility: Yes How Accessible: Accessible via walker Home Equipment: [place] (2 wheels);Cane - single point;Grab bars - tub/shower          Prior Functioning/Environment Prior Level of Function : Independent/Modified Independent;Driving                        OT Problem List: Pain      OT Treatment/Interventions:      OT Goals(Current goals can be found in the care plan section) Acute Rehab OT Goals Patient Stated Goal: Return home OT Goal Formulation: With  patient Time For Goal Achievement: 04/20/22 Potential to Achieve Goals: Good  OT Frequency:      Co-evaluation              AM-PAC OT "6 Clicks" Daily Activity     Outcome Measure Help from another person eating meals?: None Help from another person taking care of personal grooming?: None Help from another person toileting, which includes using toliet, bedpan, or urinal?: None Help from another person bathing (including washing, rinsing, drying)?: None Help from another person to put on and taking off regular upper body clothing?: None Help from another person to put on and taking off regular lower body clothing?: None 6 Click Score: 24   End of Session Equipment Utilized During Treatment: Gait belt Nurse Communication: Mobility status  Activity Tolerance: Patient tolerated treatment well Patient left: in bed;with call bell/phone within reach;with family/visitor present  OT Visit Diagnosis: Unsteadiness on feet (R26.81)                Time: 9937-1696 OT Time Calculation (min): 22 min Charges:  OT General Charges $OT Visit: 1 Visit OT Evaluation $OT Eval Moderate Complexity: 1 Mod  04/16/2022  RP, OTR/L  Acute Rehabilitation Services  Office:  [phone]   Metta Clines 04/16/2022, 2:17 PM

## 2022-04-16 NOTE — Transfer of Care (Signed)
Immediate Anesthesia Transfer of Care Note  Patient: [name] [name]  Procedure(s) Performed: Laminectomy and Foraminotomy - left - C6-C7 (Left)  Patient Location: PACU  Anesthesia Type:General  Level of Consciousness: awake and alert   Airway & Oxygen Therapy: Patient Spontanous Breathing and Patient connected to face mask oxygen  Post-op Assessment: Report given to RN, Post -op Vital signs reviewed and stable and Patient moving all extremities X 4  Post vital signs: Reviewed and stable  Last Vitals:  Vitals Value Taken Time  BP 181/69 04/16/22 0949  Temp    Pulse 76 04/16/22 0949  Resp 17 04/16/22 0950  SpO2 99 % 04/16/22 0949  Vitals shown include unvalidated device data.  Last Pain:  Vitals:   04/16/22 0720  TempSrc:   PainSc: 0-No pain         Complications: No notable events documented.

## 2022-04-16 NOTE — Progress Notes (Signed)
   Providing Compassionate, Quality Care - Together   Subjective: Patient reports he would like to stay overnight. Has expected posterior cervical pain, but feels it's reasonably controlled with the pain medication.  Objective: Vital signs in last 24 hours: Temp:  [97 F (36.1 C)-98.1 F (36.7 C)] 97.9 F (36.6 C) (06/08 1042) Pulse Rate:  [67-76] 73 (06/08 1042) Resp:  [14-18] 16 (06/08 1042) BP: (116-181)/(44-78) 156/68 (06/08 1042) SpO2:  [93 %-99 %] 94 % (06/08 1042) Weight:  [101.6 kg] 101.6 kg (06/08 0648)  Intake/Output from previous day: No intake/output data recorded. Intake/Output this shift: Total I/O In: 1480 [P.O.:480; I.V.:1000] Out: 150 [Blood:150]  Alert and oriented x 4 PERRLA CN II-XII grossly intact MAE, Strength and sensation intact Incision is covered with Honeycomb dressing and Steri Strips; Dressing is clean, dry, and intact   Lab Results: Recent Labs    04/16/22 0738  WBC 2.9*  HGB 15.7  HCT 42.1  PLT 78*   BMET Recent Labs    04/16/22 0738  NA 138  K 3.5  CL 108  CO2 21*  GLUCOSE 216*  BUN 13  CREATININE 0.92  CALCIUM 9.2    Studies/Results: DG Cervical Spine 2 or 3 views  Result Date: 04/16/2022 CLINICAL DATA:  C6-C7 laminectomy. EXAM: CERVICAL SPINE - 2-3 VIEW COMPARISON:  MRI cervical spine dated January 15, 2022. FLUOROSCOPY TIME:  Radiation Exposure Index (as provided by the fluoroscopic device): 4.52 mGy Kerma C-arm fluoroscopic images were obtained intraoperatively and submitted for post operative interpretation. FINDINGS: Single lateral intraoperative fluoroscopic image demonstrates surgical instrumentation posterior to C6-C7. IMPRESSION: 1. Intraoperative localization for C6-C7 laminectomy. Electronically Signed   By: Titus Dubin M.D.   On: 04/16/2022 10:29   DG C-Arm 1-60 Min-No Report  Result Date: 04/16/2022 Fluoroscopy was utilized by the requesting physician.  No radiographic interpretation.     Assessment/Plan: Patient underwent a left C6-7 laminotomy and foraminotomy by Dr. Annette Stable this morning. He is improving, but would like to remain overnight to ensure no pain control issues. Will likely discharge home in the morning.   LOS: 0 days     Viona Gilmore, DNP, AGNP-C Nurse Practitioner  [facility] [place] [address], [place], [place] [address] P: [phone]    F: [phone]  04/16/2022, 5:18 PM

## 2022-04-16 NOTE — Anesthesia Preprocedure Evaluation (Addendum)
Anesthesia Evaluation  Patient identified by MRN, date of birth, ID band Patient awake    Reviewed: Allergy & Precautions, NPO status , Patient's Chart, lab work & pertinent test results  Airway Mallampati: II  TM Distance: >3 FB Neck ROM: Limited    Dental  (+) Teeth Intact, Dental Advisory Given   Pulmonary asthma , sleep apnea ,    breath sounds clear to auscultation       Cardiovascular hypertension, Pt. on medications and Pt. on home beta blockers + Past MI and +CHF   Rhythm:Regular Rate:Normal     Neuro/Psych  Headaches, Anxiety TIACVA    GI/Hepatic hiatal hernia, GERD  Medicated,(+) Hepatitis -  Endo/Other  diabetes, Type 2, Oral Hypoglycemic Agents  Renal/GU Renal disease     Musculoskeletal  (+) Arthritis ,   Abdominal Normal abdominal exam  (+)   Peds  Hematology   Anesthesia Other Findings LUE numbness/tingling  Reproductive/Obstetrics                            Anesthesia Physical Anesthesia Plan  ASA: 3  Anesthesia Plan: General   Post-op Pain Management:    Induction: Intravenous  PONV Risk Score and Plan: 3 and Ondansetron, Dexamethasone and Midazolam  Airway Management Planned: Oral ETT and Video Laryngoscope Planned  Additional Equipment: None  Intra-op Plan:   Post-operative Plan: Extubation in OR  Informed Consent: I have reviewed the patients History and Physical, chart, labs and discussed the procedure including the risks, benefits and alternatives for the proposed anesthesia with the patient or authorized representative who has indicated his/her understanding and acceptance.     Dental advisory given  Plan Discussed with: CRNA  Anesthesia Plan Comments:        Anesthesia Quick Evaluation

## 2022-04-16 NOTE — Anesthesia Procedure Notes (Signed)
Procedure Name: Intubation Date/Time: 04/16/2022 8:05 AM  Performed by: Terrence Dupont, CRNAPre-anesthesia Checklist: Patient identified, Emergency Drugs available, Suction available and Patient being monitored Patient Re-evaluated:Patient Re-evaluated prior to induction Oxygen Delivery Method: Circle system utilized Preoxygenation: Pre-oxygenation with 100% oxygen Induction Type: IV induction Ventilation: Mask ventilation without difficulty Laryngoscope Size: Glidescope and 3 Grade View: Grade I Tube type: Oral Tube size: 7.5 mm Number of attempts: 1 Airway Equipment and Method: Stylet and Oral airway Placement Confirmation: ETT inserted through vocal cords under direct vision, positive ETCO2 and breath sounds checked- equal and bilateral Tube secured with: Tape Dental Injury: Teeth and Oropharynx as per pre-operative assessment

## 2022-04-16 NOTE — Brief Op Note (Signed)
04/16/2022  9:36 AM  PATIENT:  [name] [name]  66 y.o. male  PRE-OPERATIVE DIAGNOSIS:  Radiculopathy  POST-OPERATIVE DIAGNOSIS:  Radiculopathy  PROCEDURE:  Procedure(s) with comments: Laminectomy and Foraminotomy - left - C6-C7 (Left) - 3C  SURGEON:  Surgeon(s) and Role:    * Earnie Larsson, MD - Primary  PHYSICIAN ASSISTANT:   ASSISTANTSMearl Latin   ANESTHESIA:   general  EBL:  150 mL   BLOOD ADMINISTERED:none  DRAINS: none   LOCAL MEDICATIONS USED:  MARCAINE     SPECIMEN:  No Specimen  DISPOSITION OF SPECIMEN:  N/A  COUNTS:  YES  TOURNIQUET:  * No tourniquets in log *  DICTATION: .Dragon Dictation  PLAN OF CARE: Admit for overnight observation  PATIENT DISPOSITION:  PACU - hemodynamically stable.   Delay start of Pharmacological VTE agent (>24hrs) due to surgical blood loss or risk of bleeding: yes

## 2022-04-16 NOTE — Anesthesia Postprocedure Evaluation (Signed)
Anesthesia Post Note  Patient: JOHNNEY SCARLATA  Procedure(s) Performed: Laminectomy and Foraminotomy - left - C6-C7 (Left)     Patient location during evaluation: PACU Anesthesia Type: General Level of consciousness: awake and alert Pain management: pain level controlled Vital Signs Assessment: post-procedure vital signs reviewed and stable Respiratory status: spontaneous breathing, nonlabored ventilation, respiratory function stable and patient connected to nasal cannula oxygen Cardiovascular status: blood pressure returned to baseline and stable Postop Assessment: no apparent nausea or vomiting Anesthetic complications: no   No notable events documented.  Last Vitals:  Vitals:   04/16/22 1020 04/16/22 1042  BP: (!) 116/46 (!) 156/68  Pulse: 73 73  Resp: 15 16  Temp: (!) 36.1 C 36.6 C  SpO2: 94% 94%    Last Pain:  Vitals:   04/16/22 1042  TempSrc: Oral  PainSc: 2                  Effie Berkshire

## 2022-04-16 NOTE — Op Note (Signed)
Date of procedure: 04/16/2022  Date of dictation: Same  Service: Neurosurgery  Preoperative diagnosis: Left C6-C7 foraminal stenosis with radiculopathy  Postoperative diagnosis: Same  Procedure Name: Left C6-7 laminotomy and foraminotomy  Surgeon:Arloa Prak A.Shaketa Serafin, M.D.  Asst. Surgeon: Reinaldo Meeker, NP  Anesthesia: General  Indication: 66 year old male with left upper extremity pain paresthesias and weakness consistent with a left-sided C7 radiculopathy which is failed conservative management work-up demonstrates evidence of significant mid and distal foraminal stenosis on the left at C6-7 with marked compression of the left C7 nerve root.  Patient presents now for laminotomy and foraminotomies in hopes of improving his symptoms.  Operative note: After induction of anesthesia, patient position prone on the bolsters and appropriately padded.  His head was fixed in [place] pin headrest.  Patient's posterior cervical region prepped and draped sterilely.  Incision made overlying C6-7.  Dissection performed on the left.  Retractor placed.  X-rays were taken but given the patient's body habitus it was difficult to confirm the precise level.  C-arm fluoroscopy was then brought in and it was determined with reasonable confidence that we are at the C6-7 level.  This was also confirmed by examining the spinous process anatomy and comparing that to the patient's preoperative imaging.  I felt comfortable that we were at the right level and I felt that I had done all I could do to ensure that.  Laminotomies then performed using high-speed drill and Kerrison rongeurs.  Ligament flavum elevated and resected.  Foraminotomies was then carried out by resecting the medial aspect of the C6 and C7 foramen.  The C7 nerve root was tracked distally within the foramen and wide foraminotomies was performed along its course.  There was no evidence of injury to thecal sac or nerve roots.  There was no evidence of obvious disc  herniation or other compressive pathology.  Wound was then irrigated.  Gelfoam was placed topically for hemostasis.  Wounds then closed in layers with Vicryl sutures.  Steri-Strips and sterile dressing were applied.  No apparent complications.  Patient tolerated the procedure well and he returns to the recovery room postop.

## 2022-04-16 NOTE — H&P (Signed)
[name] [name] is an 66 y.o. male.   Chief Complaint: Left arm pain HPI: 66 year old male with neck and left cervical radicular pain consistent with a left-sided C7 radiculopathy which is failed conservative management.  Work-up demonstrates evidence of significant spondylosis and foraminal stenosis on the left at C6-7.  Patient presents now for decompressive surgery.  Past Medical History:  Diagnosis Date   Anxiety    Arthritis    Asthma    Cataract    Right eye   Chronic lower back pain    Cirrhosis of liver (HCC)    Coronary atherosclerosis of native coronary artery    a. s/p multiple prior stents, s/p CABG in 01/2016 with LIMA-LAD, Free RIMA-OM2 and SVG-PDA b. 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   LIMA to LAD, free RIMA to OM2, SVG to PDA, open SVG harvest from right thigh   Thrombocytopenia (HCC)    TIA (transient ischemic attack)    Type 2 diabetes mellitus ([place])     Past Surgical History:  Procedure Laterality Date   ANTERIOR CERVICAL DECOMP/DISCECTOMY FUSION  1998   "C3-4"   CARDIAC CATHETERIZATION  "several"   CARDIAC CATHETERIZATION N/A 12/30/2015   Procedure: Left Heart Cath and Coronary Angiography;  Surgeon: [name], MD;  Location: [place] [facility];  Service: Cardiovascular;  Laterality: N/A;   CARPAL TUNNEL RELEASE Bilateral 2005   CHOLECYSTECTOMY N/A 11/28/2018   Procedure: LAPAROSCOPIC CHOLECYSTECTOMY;  Surgeon: [name], MD;  Location: [facility];  Service: General;  Laterality: N/A;   COLONOSCOPY     COLONOSCOPY WITH PROPOFOL N/A 09/27/2020   Procedure: COLONOSCOPY WITH PROPOFOL;  Surgeon: [name], MD;   Location: [facility];  Service: Gastroenterology;  Laterality: N/A;   CORONARY ANGIOPLASTY     CORONARY ANGIOPLASTY WITH STENT PLACEMENT  2002; 2003; 11/20/2014   "I have 4 stents after today" (11/20/2014)   CORONARY ARTERY BYPASS GRAFT N/A 01/09/2016   Procedure: CORONARY ARTERY BYPASS GRAFTING (CABG) X 3 UTILIZING RIGHT AND LEFT INTERNAL MAMMARY ARTERY AND ENDOSCOPICALLY HARVESTED SAPHENEOUS VEIN.;  Surgeon: Rexene Alberts, MD;  Location: [place];  Service: Open Heart Surgery;  Laterality: N/A;   ESOPHAGOGASTRODUODENOSCOPY     ESOPHAGOGASTRODUODENOSCOPY (EGD) WITH PROPOFOL N/A 09/27/2020   Procedure: ESOPHAGOGASTRODUODENOSCOPY (EGD) WITH PROPOFOL;  Surgeon: [name], MD;  [facility]: [facility];  Service: Gastroenterology;  Laterality: N/A;  10   KNEE SURGERY Left 02/2012   "scraped; open"   LEFT HEART CATH AND CORS/GRAFTS ANGIOGRAPHY N/A 08/09/2017   Procedure: LEFT HEART CATH AND CORS/GRAFTS ANGIOGRAPHY;  Surgeon: [name], MD;  Location: [place] [facility];  Service: Cardiovascular;  Laterality: N/A;   LEFT HEART CATH AND CORS/GRAFTS ANGIOGRAPHY N/A 11/22/2018   Procedure: LEFT HEART CATH AND CORS/GRAFTS ANGIOGRAPHY;  Surgeon: [name], [name], MD;  Location: [place] [facility];  Service: Cardiovascular;  Laterality: N/A;   LEFT HEART CATHETERIZATION WITH CORONARY ANGIOGRAM N/A 07/20/2012   Procedure: LEFT HEART CATHETERIZATION WITH CORONARY ANGIOGRAM;  Surgeon: [name], MD;  Location: [facility]  LAB;  Service: Cardiovascular;  Laterality: N/A;   LEFT HEART CATHETERIZATION WITH CORONARY ANGIOGRAM N/A 11/20/2014   Procedure: LEFT HEART CATHETERIZATION WITH CORONARY ANGIOGRAM;  Surgeon: [name], MD;  Location: [facility] C[facility] LAB;  Service: Cardiovascular;  Laterality: N/A;   LEFT HEART CATHETERIZATION WITH CORONARY ANGIOGRAM N/A 11/26/2014   Procedure: LEFT HEART CATHETERIZATION WITH CORONARY ANGIOGRAM;  Surgeon: [name], MD;  Location: [facility] C[facility] LAB;  Service:  Cardiovascular;  Laterality: N/A;   NEUROPLASTY / TRANSPOSITION ULNAR NERVE AT ELBOW Right ~ 2012   PERCUTANEOUS CORONARY ROTOBLATOR INTERVENTION (PCI-R)  11/20/2014   Procedure: PERCUTANEOUS CORONARY ROTOBLATOR INTERVENTION (PCI-R);  Surgeon: [name], MD;  Location: [facility] CATH LAB;  Service: Cardiovascular;;   SHOULDER ARTHROSCOPY Left ~ 2011   TEE WITHOUT CARDIOVERSION N/A 01/09/2016   Procedure: TRANSESOPHAGEAL ECHOCARDIOGRAM (TEE);  Surgeon: Rexene Alberts, MD;  Location: [place];  Service: Open Heart Surgery;  Laterality: N/A;    Family History  Problem Relation Age of Onset   Stroke Mother    Coronary artery disease Father 36   Asthma Sister    Multiple sclerosis Brother    Heart disease Paternal Uncle    Stroke Maternal Grandmother    Heart attack Paternal Grandmother    Cancer Paternal Grandfather    Asthma Sister    Seizures Son    Migraines Son    Autism Son    Migraines Son    Social History:  reports that he has never smoked. He has never used smokeless tobacco. He reports that he does not drink alcohol and does not use drugs.  Allergies:  Allergies  Allergen Reactions   Divalproex Sodium Other (See Comments)    Causes anger   Statins Other (See Comments)    Muscle aches and cramps   Tramadol Other (See Comments)    Chest pain    Valproic Acid Other (See Comments)    Causes anger   Gadolinium Derivatives Nausea And Vomiting    07/25/19 Pt vomited immediately after IV gad. Denies itching, dyspnea.  (Adverse, not allergic, reaction   Tricor [Fenofibrate] Other (See Comments)    Leg cramps    Medications Prior to Admission  Medication Sig Dispense Refill   albuterol (PROVENTIL HFA;VENTOLIN HFA) 108 (90 BASE) MCG/ACT inhaler Inhale 2 puffs into the lungs every 6 (six) hours as needed for wheezing or shortness of breath.     aspirin EC 81 MG tablet Take 2 tablets (162 mg total) by mouth at bedtime. (Patient taking differently: Take 81 mg by mouth at bedtime.) 60  tablet 4   carvedilol (COREG) 3.125 MG tablet TAKE 1 TABLET BY MOUTH TWICE DAILY 60 tablet 3   citalopram (CELEXA) 20 MG tablet Take 20 mg by mouth at bedtime.      fluticasone (FLONASE) 50 MCG/ACT nasal spray Place 2 sprays into the nose 2 (two) times daily.     Fluticasone-Salmeterol (ADVAIR) 250-50 MCG/DOSE AEPB Inhale 1 puff into the lungs every 12 (twelve) hours.     furosemide (LASIX) 20 MG tablet Take 20 mg by mouth daily.     HUMALOG KWIKPEN 100 UNIT/ML KwikPen Inject 5-30 Units into the skin 3 (three) times daily as needed (blood sugar of 200 or higher).     isosorbide mononitrate (IMDUR) 60 MG 24 hr tablet Take 1 tablet (60 mg total) by mouth daily. 90 tablet 1   Melatonin 5 MG TABS Take 10 mg by mouth at bedtime.     metFORMIN (GLUCOPHAGE) 1000  MG tablet Take 1 tablet (1,000 mg total) by mouth 2 (two) times daily. For the next 2 days as you did receive IV contrast, then resume on 11/26/2018 (Patient taking differently: Take 1,000 mg by mouth 2 (two) times daily.) 60 tablet 11   omeprazole (PRILOSEC) 40 MG capsule Take 40 mg by mouth daily.     TRESIBA FLEXTOUCH 100 UNIT/ML FlexTouch Pen Inject 65 Units into the skin in the morning and at bedtime. Inject 60 units in the AM and 70 units in the PM. (Patient taking differently: Inject 60-70 Units into the skin See admin instructions. Inject 60 units in the AM and 70 units in the PM.)     zonisamide (ZONEGRAN) 100 MG capsule Take 100 mg by mouth at bedtime.     cyclobenzaprine (FLEXERIL) 10 MG tablet Take 1 tablet (10 mg total) by mouth 2 (two) times daily as needed for muscle spasms. (Patient not taking: Reported on 04/13/2022) 20 tablet 0   naproxen (NAPROSYN) 500 MG tablet Take 1 tablet (500 mg total) by mouth 2 (two) times daily. (Patient not taking: Reported on 04/13/2022) 14 tablet 0   nitroGLYCERIN (NITROSTAT) 0.4 MG SL tablet Place 0.4 mg under the tongue every 5 (five) minutes x 3 doses as needed for chest pain (if no relief after 2nd dose,  proceed to the ED for an evaluation or call 911).     pravastatin (PRAVACHOL) 20 MG tablet Take 2 tablets (40 mg total) by mouth daily. (Patient not taking: Reported on 04/13/2022) 60 tablet 3    Results for orders placed or performed during the hospital encounter of 04/16/22 (from the past 48 hour(s))  Glucose, capillary     Status: Abnormal   Collection Time: 04/16/22  6:50 AM  Result Value Ref Range   Glucose-Capillary 229 (H) 70 - 99 mg/dL    Comment: Glucose reference range applies only to samples taken after fasting for at least 8 hours.   No results found.  Pertinent items noted in HPI and remainder of comprehensive ROS otherwise negative.  Blood pressure (!) 164/78, pulse 67, temperature 98.1 F (36.7 C), temperature source Oral, resp. rate 18, height 5' 9"  (1.753 m), weight 101.6 kg, SpO2 98 %.  Patient is awake and alert.  He is oriented and appropriate.  Speech is fluent.  Judgment and insight are intact.  Cranial nerve function normal bilateral.  Motor examination with weakness of his left-sided triceps muscle group grading out at 4/5.  He has decreased sensation pinprick light touch in his left C7 dermatome.  Deep tendon reflexes are normal active except his left triceps reflex is absent.  No evidence of long track signs.  Gait and posture are normal.  Examination head ears eyes nose and throat some are clear chest and abdomen are benign.  Extremities are free from injury deformity. Assessment/Plan Left C6-7 foraminal stenosis with radiculopathy.  Plan left C6-7 laminotomy and foraminotomy.  Risks and benefits been explained.  Patient wishes to proceed.  Mallie Mussel A Shreyansh Tiffany 04/16/2022, 7:45 AM

## 2022-04-17 ENCOUNTER — Encounter (HOSPITAL_COMMUNITY): Payer: Self-pay | Admitting: Neurosurgery

## 2022-04-17 DIAGNOSIS — M5412 Radiculopathy, cervical region: Secondary | ICD-10-CM | POA: Diagnosis not present

## 2022-04-17 DIAGNOSIS — Z8673 Personal history of transient ischemic attack (TIA), and cerebral infarction without residual deficits: Secondary | ICD-10-CM | POA: Diagnosis not present

## 2022-04-17 DIAGNOSIS — Z955 Presence of coronary angioplasty implant and graft: Secondary | ICD-10-CM | POA: Diagnosis not present

## 2022-04-17 DIAGNOSIS — Z7982 Long term (current) use of aspirin: Secondary | ICD-10-CM | POA: Diagnosis not present

## 2022-04-17 DIAGNOSIS — Z951 Presence of aortocoronary bypass graft: Secondary | ICD-10-CM | POA: Diagnosis not present

## 2022-04-17 DIAGNOSIS — I2510 Atherosclerotic heart disease of native coronary artery without angina pectoris: Secondary | ICD-10-CM | POA: Diagnosis not present

## 2022-04-17 DIAGNOSIS — E119 Type 2 diabetes mellitus without complications: Secondary | ICD-10-CM | POA: Diagnosis not present

## 2022-04-17 DIAGNOSIS — Z7984 Long term (current) use of oral hypoglycemic drugs: Secondary | ICD-10-CM | POA: Diagnosis not present

## 2022-04-17 DIAGNOSIS — J45909 Unspecified asthma, uncomplicated: Secondary | ICD-10-CM | POA: Diagnosis not present

## 2022-04-17 DIAGNOSIS — I252 Old myocardial infarction: Secondary | ICD-10-CM | POA: Diagnosis not present

## 2022-04-17 DIAGNOSIS — I10 Essential (primary) hypertension: Secondary | ICD-10-CM | POA: Diagnosis not present

## 2022-04-17 DIAGNOSIS — Z79899 Other long term (current) drug therapy: Secondary | ICD-10-CM | POA: Diagnosis not present

## 2022-04-17 DIAGNOSIS — Z794 Long term (current) use of insulin: Secondary | ICD-10-CM | POA: Diagnosis not present

## 2022-04-17 DIAGNOSIS — M4802 Spinal stenosis, cervical region: Secondary | ICD-10-CM | POA: Diagnosis not present

## 2022-04-17 LAB — GLUCOSE, CAPILLARY
Glucose-Capillary: 252 mg/dL — ABNORMAL HIGH (ref 70–99)
Glucose-Capillary: 104 mg/dL — ABNORMAL HIGH (ref 70–99)

## 2022-04-17 MED ORDER — CYCLOBENZAPRINE HCL 10 MG PO TABS: 10.0000 mg | ORAL_TABLET | Freq: Three times a day (TID) | ORAL | 0 refills | Status: DC | PRN

## 2022-04-17 MED ORDER — HYDROCODONE-ACETAMINOPHEN 5-325 MG PO TABS: 1.0000 | ORAL_TABLET | ORAL | 0 refills | Status: DC | PRN

## 2022-04-17 NOTE — Plan of Care (Signed)
  Problem: Education: Goal: Ability to verbalize activity precautions or restrictions will improve Outcome: Completed/Met Goal: Knowledge of the prescribed therapeutic regimen will improve Outcome: Completed/Met Goal: Understanding of discharge needs will improve Outcome: Completed/Met   Problem: Activity: Goal: Ability to avoid complications of mobility impairment will improve Outcome: Completed/Met Goal: Ability to tolerate increased activity will improve Outcome: Completed/Met Goal: Will remain free from falls Outcome: Completed/Met   Problem: Bowel/Gastric: Goal: Gastrointestinal status for postoperative course will improve Outcome: Completed/Met   Problem: Clinical Measurements: Goal: Ability to maintain clinical measurements within normal limits will improve Outcome: Completed/Met Goal: Postoperative complications will be avoided or minimized Outcome: Completed/Met Goal: Diagnostic test results will improve Outcome: Completed/Met   Problem: Pain Management: Goal: Pain level will decrease Outcome: Completed/Met   Problem: Skin Integrity: Goal: Will show signs of wound healing Outcome: Completed/Met   Problem: Health Behavior/Discharge Planning: Goal: Identification of resources available to assist in meeting health care needs will improve Outcome: Completed/Met   Problem: Bladder/Genitourinary: Goal: Urinary functional status for postoperative course will improve Outcome: Completed/Met   Problem: Education: Goal: Ability to describe self-care measures that may prevent or decrease complications (Diabetes Survival Skills Education) will improve Outcome: Completed/Met Goal: Individualized Educational Video(s) Outcome: Completed/Met   Problem: Coping: Goal: Ability to adjust to condition or change in health will improve Outcome: Completed/Met   Problem: Fluid Volume: Goal: Ability to maintain a balanced intake and output will improve Outcome: Completed/Met    Problem: Health Behavior/Discharge Planning: Goal: Ability to identify and utilize available resources and services will improve Outcome: Completed/Met Goal: Ability to manage health-related needs will improve Outcome: Completed/Met

## 2022-04-17 NOTE — Discharge Summary (Signed)
Physician Discharge Summary  Patient ID: [name] [name] MRN: [id number] DOB/AGE: 64-Jan-1957 64 y.o.  Admit date: 04/16/2022 Discharge date: 04/17/2022  Admission Diagnoses:  Discharge Diagnoses:  Principal Problem:   Cervical radiculopathy   Discharged Condition: good  Hospital Course: Patient admitted to the hospital where he underwent uncomplicated left-sided N0-5 laminotomy and foraminotomy.  Postoperatively he is doing well.  Preoperative pain numbness and weakness are all much improved.  Standing ambulating and voiding without difficulty.  Ready for discharge home.  Consults:   Significant Diagnostic Studies:   Treatments:   Discharge Exam: Blood pressure 127/61, pulse 75, temperature 97.9 F (36.6 C), temperature source Oral, resp. rate 18, height 5' 9"  (1.753 m), weight 101.6 kg, SpO2 98 %. Awake and alert.  Oriented and appropriate.  Motor and sensory function intact.  Wound clean and dry.  Chest and abdomen benign.  Disposition: Discharge disposition: 01-Home or Self Care        Allergies as of 04/17/2022       Reactions   Divalproex Sodium Other (See Comments)   Causes anger   Statins Other (See Comments)   Muscle aches and cramps   Tramadol Other (See Comments)   Chest pain    Valproic Acid Other (See Comments)   Causes anger   Gadolinium Derivatives Nausea And Vomiting   07/25/19 Pt vomited immediately after IV gad. Denies itching, dyspnea.  (Adverse, not allergic, reaction   Tricor [fenofibrate] Other (See Comments)   Leg cramps        Medication List     TAKE these medications    albuterol 108 (90 Base) MCG/ACT inhaler Commonly known as: VENTOLIN HFA Inhale 2 puffs into the lungs every 6 (six) hours as needed for wheezing or shortness of breath.   aspirin EC 81 MG tablet Take 2 tablets (162 mg total) by mouth at bedtime. What changed: how much to take   carvedilol 3.125 MG tablet Commonly known as: COREG TAKE 1 TABLET BY MOUTH TWICE  DAILY   citalopram 20 MG tablet Commonly known as: CELEXA Take 20 mg by mouth at bedtime.   cyclobenzaprine 10 MG tablet Commonly known as: FLEXERIL Take 1 tablet (10 mg total) by mouth 2 (two) times daily as needed for muscle spasms. What changed: Another medication with the same name was added. Make sure you understand how and when to take each.   cyclobenzaprine 10 MG tablet Commonly known as: FLEXERIL Take 1 tablet (10 mg total) by mouth 3 (three) times daily as needed for muscle spasms. What changed: You were already taking a medication with the same name, and this prescription was added. Make sure you understand how and when to take each.   fluticasone 50 MCG/ACT nasal spray Commonly known as: FLONASE Place 2 sprays into the nose 2 (two) times daily.   Fluticasone-Salmeterol 250-50 MCG/DOSE Aepb Commonly known as: ADVAIR Inhale 1 puff into the lungs every 12 (twelve) hours.   furosemide 20 MG tablet Commonly known as: LASIX Take 20 mg by mouth daily.   HumaLOG KwikPen 100 UNIT/ML KwikPen Generic drug: insulin lispro Inject 5-30 Units into the skin 3 (three) times daily as needed (blood sugar of 200 or higher).   HYDROcodone-acetaminophen 5-325 MG tablet Commonly known as: NORCO/VICODIN Take 1 tablet by mouth every 4 (four) hours as needed for moderate pain ((score 4 to 6)).   isosorbide mononitrate 60 MG 24 hr tablet Commonly known as: IMDUR Take 1 tablet (60 mg total) by mouth daily.  melatonin 5 MG Tabs Take 10 mg by mouth at bedtime.   metFORMIN 1000 MG tablet Commonly known as: GLUCOPHAGE Take 1 tablet (1,000 mg total) by mouth 2 (two) times daily. For the next 2 days as you did receive IV contrast, then resume on 11/26/2018 What changed: additional instructions   naproxen 500 MG tablet Commonly known as: NAPROSYN Take 1 tablet (500 mg total) by mouth 2 (two) times daily.   nitroGLYCERIN 0.4 MG SL tablet Commonly known as: NITROSTAT Place 0.4 mg under  the tongue every 5 (five) minutes x 3 doses as needed for chest pain (if no relief after 2nd dose, proceed to the ED for an evaluation or call 911).   omeprazole 40 MG capsule Commonly known as: PRILOSEC Take 40 mg by mouth daily.   pravastatin 20 MG tablet Commonly known as: PRAVACHOL Take 2 tablets (40 mg total) by mouth daily.   [name] FlexTouch 100 UNIT/ML FlexTouch Pen Generic drug: insulin degludec Inject 65 Units into the skin in the morning and at bedtime. Inject 60 units in the AM and 70 units in the PM. What changed:  how much to take when to take this   zonisamide 100 MG capsule Commonly known as: ZONEGRAN Take 100 mg by mouth at bedtime.         Signed: [name] 04/17/2022, 9:22 AM

## 2022-04-17 NOTE — Discharge Instructions (Addendum)
Wound Care Keep incision area dry.  You may remove outer bandage after 3 days  Do not put any creams, lotions, or ointments on incision. Leave steri-strips on neck.  They will fall off by themselves.  Activity Walk each and every day, increasing distance each day. No lifting greater than 5 lbs.  Avoid excessive neck motion. No driving for 2 weeks; may ride as a passenger locally.   Diet Resume your normal diet.    Call Your Doctor If Any of These Occur Redness, drainage, or swelling at the wound.  Temperature greater than 101 degrees. Severe pain not relieved by pain medication. Increased difficulty swallowing.  Incision starts to come apart.  Follow Up Appt Call [phone])  for problems.  If you have any hardware placed in your spine, you will need an x-ray before your appointment.

## 2022-05-04 ENCOUNTER — Encounter (HOSPITAL_COMMUNITY): Payer: Self-pay | Admitting: Hematology

## 2022-06-24 DIAGNOSIS — Z713 Dietary counseling and surveillance: Secondary | ICD-10-CM | POA: Diagnosis not present

## 2022-06-24 DIAGNOSIS — E782 Mixed hyperlipidemia: Secondary | ICD-10-CM | POA: Diagnosis not present

## 2022-06-24 DIAGNOSIS — Z79899 Other long term (current) drug therapy: Secondary | ICD-10-CM | POA: Diagnosis not present

## 2022-06-24 DIAGNOSIS — G629 Polyneuropathy, unspecified: Secondary | ICD-10-CM | POA: Diagnosis not present

## 2022-06-24 DIAGNOSIS — E559 Vitamin D deficiency, unspecified: Secondary | ICD-10-CM | POA: Diagnosis not present

## 2022-06-24 DIAGNOSIS — E1129 Type 2 diabetes mellitus with other diabetic kidney complication: Secondary | ICD-10-CM | POA: Diagnosis not present

## 2022-06-24 DIAGNOSIS — E538 Deficiency of other specified B group vitamins: Secondary | ICD-10-CM | POA: Diagnosis not present

## 2022-06-24 DIAGNOSIS — I10 Essential (primary) hypertension: Secondary | ICD-10-CM | POA: Diagnosis not present

## 2022-06-24 DIAGNOSIS — Z7189 Other specified counseling: Secondary | ICD-10-CM | POA: Diagnosis not present

## 2022-07-29 ENCOUNTER — Other Ambulatory Visit (INDEPENDENT_AMBULATORY_CARE_PROVIDER_SITE_OTHER): Payer: Self-pay | Admitting: Gastroenterology

## 2022-07-29 ENCOUNTER — Other Ambulatory Visit: Payer: Self-pay | Admitting: Cardiology

## 2022-07-30 ENCOUNTER — Other Ambulatory Visit: Payer: Self-pay

## 2022-07-30 ENCOUNTER — Emergency Department (HOSPITAL_COMMUNITY)
Admission: EM | Admit: 2022-07-30 | Discharge: 2022-07-30 | Disposition: A | Discharge status: Home / Self Care | Payer: Medicare Other | Attending: Emergency Medicine | Admitting: Emergency Medicine

## 2022-07-30 ENCOUNTER — Emergency Department (HOSPITAL_COMMUNITY): Payer: Medicare Other

## 2022-07-30 ENCOUNTER — Encounter (HOSPITAL_COMMUNITY): Payer: Self-pay | Admitting: Hematology

## 2022-07-30 ENCOUNTER — Encounter (HOSPITAL_COMMUNITY): Payer: Self-pay

## 2022-07-30 DIAGNOSIS — I63213 Cerebral infarction due to unspecified occlusion or stenosis of bilateral vertebral arteries: Secondary | ICD-10-CM | POA: Diagnosis not present

## 2022-07-30 DIAGNOSIS — M47812 Spondylosis without myelopathy or radiculopathy, cervical region: Secondary | ICD-10-CM | POA: Diagnosis not present

## 2022-07-30 DIAGNOSIS — Z79899 Other long term (current) drug therapy: Secondary | ICD-10-CM | POA: Diagnosis not present

## 2022-07-30 DIAGNOSIS — I2510 Atherosclerotic heart disease of native coronary artery without angina pectoris: Secondary | ICD-10-CM | POA: Diagnosis not present

## 2022-07-30 DIAGNOSIS — Z8673 Personal history of transient ischemic attack (TIA), and cerebral infarction without residual deficits: Secondary | ICD-10-CM | POA: Diagnosis not present

## 2022-07-30 DIAGNOSIS — Z20822 Contact with and (suspected) exposure to covid-19: Secondary | ICD-10-CM | POA: Diagnosis not present

## 2022-07-30 DIAGNOSIS — Z981 Arthrodesis status: Secondary | ICD-10-CM | POA: Diagnosis not present

## 2022-07-30 DIAGNOSIS — Z7982 Long term (current) use of aspirin: Secondary | ICD-10-CM | POA: Diagnosis not present

## 2022-07-30 DIAGNOSIS — R531 Weakness: Secondary | ICD-10-CM

## 2022-07-30 DIAGNOSIS — E119 Type 2 diabetes mellitus without complications: Secondary | ICD-10-CM | POA: Diagnosis not present

## 2022-07-30 DIAGNOSIS — Z951 Presence of aortocoronary bypass graft: Secondary | ICD-10-CM | POA: Insufficient documentation

## 2022-07-30 DIAGNOSIS — G43809 Other migraine, not intractable, without status migrainosus: Secondary | ICD-10-CM | POA: Insufficient documentation

## 2022-07-30 DIAGNOSIS — I10 Essential (primary) hypertension: Secondary | ICD-10-CM | POA: Diagnosis not present

## 2022-07-30 DIAGNOSIS — J45909 Unspecified asthma, uncomplicated: Secondary | ICD-10-CM | POA: Diagnosis not present

## 2022-07-30 DIAGNOSIS — Z7951 Long term (current) use of inhaled steroids: Secondary | ICD-10-CM | POA: Insufficient documentation

## 2022-07-30 DIAGNOSIS — Z7984 Long term (current) use of oral hypoglycemic drugs: Secondary | ICD-10-CM | POA: Diagnosis not present

## 2022-07-30 DIAGNOSIS — M50223 Other cervical disc displacement at C6-C7 level: Secondary | ICD-10-CM | POA: Diagnosis not present

## 2022-07-30 DIAGNOSIS — Z9889 Other specified postprocedural states: Secondary | ICD-10-CM | POA: Diagnosis not present

## 2022-07-30 DIAGNOSIS — R29818 Other symptoms and signs involving the nervous system: Secondary | ICD-10-CM | POA: Diagnosis not present

## 2022-07-30 DIAGNOSIS — I63233 Cerebral infarction due to unspecified occlusion or stenosis of bilateral carotid arteries: Secondary | ICD-10-CM | POA: Diagnosis not present

## 2022-07-30 HISTORY — DX: Cerebral infarction, unspecified: I63.9

## 2022-07-30 LAB — I-STAT CHEM 8, ED
BUN: 17 mg/dL (ref 8–23)
Calcium, Ion: 1.21 mmol/L (ref 1.15–1.40)
Chloride: 105 mmol/L (ref 98–111)
Creatinine, Ser: 0.9 mg/dL (ref 0.61–1.24)
Glucose, Bld: 117 mg/dL — ABNORMAL HIGH (ref 70–99)
HCT: 41 % (ref 39.0–52.0)
Hemoglobin: 13.9 g/dL (ref 13.0–17.0)
Potassium: 4 mmol/L (ref 3.5–5.1)
Sodium: 141 mmol/L (ref 135–145)
TCO2: 24 mmol/L (ref 22–32)

## 2022-07-30 LAB — COMPREHENSIVE METABOLIC PANEL
ALT: 40 U/L (ref 0–44)
AST: 50 U/L — ABNORMAL HIGH (ref 15–41)
Albumin: 3.5 g/dL (ref 3.5–5.0)
Alkaline Phosphatase: 51 U/L (ref 38–126)
Anion gap: 7 (ref 5–15)
BUN: 17 mg/dL (ref 8–23)
CO2: 23 mmol/L (ref 22–32)
Calcium: 8.7 mg/dL — ABNORMAL LOW (ref 8.9–10.3)
Chloride: 107 mmol/L (ref 98–111)
Creatinine, Ser: 0.99 mg/dL (ref 0.61–1.24)
GFR, Estimated: >60 mL/min (ref >60)
Glucose, Bld: 121 mg/dL — ABNORMAL HIGH (ref 70–99)
Potassium: 3.9 mmol/L (ref 3.5–5.1)
Sodium: 137 mmol/L (ref 135–145)
Total Bilirubin: 1 mg/dL (ref 0.3–1.2)
Total Protein: 6.8 g/dL (ref 6.5–8.1)

## 2022-07-30 LAB — PROTIME-INR
INR: 1.2 (ref 0.8–1.2)
Prothrombin Time: 14.6 seconds (ref 11.4–15.2)

## 2022-07-30 LAB — URINALYSIS, ROUTINE W REFLEX MICROSCOPIC
Bilirubin Urine: NEGATIVE
Glucose, UA: NEGATIVE mg/dL
Ketones, ur: NEGATIVE mg/dL
Leukocytes,Ua: NEGATIVE
Nitrite: NEGATIVE
Protein, ur: 30 mg/dL — AB
Specific Gravity, Urine: 1.027 (ref 1.005–1.030)
pH: 6 (ref 5.0–8.0)

## 2022-07-30 LAB — DIFFERENTIAL
Abs Immature Granulocytes: 0.01 10*3/uL (ref 0.00–0.07)
Basophils Absolute: 0 10*3/uL (ref 0.0–0.1)
Basophils Relative: 1 %
Eosinophils Absolute: 0.1 10*3/uL (ref 0.0–0.5)
Eosinophils Relative: 2 %
Immature Granulocytes: 0 %
Lymphocytes Relative: 29 %
Lymphs Abs: 0.9 10*3/uL (ref 0.7–4.0)
Monocytes Absolute: 0.2 10*3/uL (ref 0.1–1.0)
Monocytes Relative: 6 %
Neutro Abs: 1.8 10*3/uL (ref 1.7–7.7)
Neutrophils Relative %: 62 %

## 2022-07-30 LAB — CBC
HCT: 40.8 % (ref 39.0–52.0)
Hemoglobin: 14.6 g/dL (ref 13.0–17.0)
MCH: 32 pg (ref 26.0–34.0)
MCHC: 35.8 g/dL (ref 30.0–36.0)
MCV: 89.5 fL (ref 80.0–100.0)
Platelets: 71 10*3/uL — ABNORMAL LOW (ref 150–400)
RBC: 4.56 MIL/uL (ref 4.22–5.81)
RDW: 13.5 % (ref 11.5–15.5)
WBC: 2.9 10*3/uL — ABNORMAL LOW (ref 4.0–10.5)
nRBC: 0 % (ref 0.0–0.2)

## 2022-07-30 LAB — RAPID URINE DRUG SCREEN, HOSP PERFORMED
Amphetamines: NOT DETECTED
Barbiturates: NOT DETECTED
Benzodiazepines: NOT DETECTED
Cocaine: NOT DETECTED
Opiates: NOT DETECTED
Tetrahydrocannabinol: NOT DETECTED

## 2022-07-30 LAB — RESP PANEL BY RT-PCR (FLU A&B, COVID) ARPGX2
Influenza A by PCR: NEGATIVE
Influenza B by PCR: NEGATIVE
SARS Coronavirus 2 by RT PCR: NEGATIVE

## 2022-07-30 LAB — ETHANOL: Alcohol, Ethyl (B): <10 mg/dL (ref <10)

## 2022-07-30 LAB — CBG MONITORING, ED: Glucose-Capillary: 120 mg/dL — ABNORMAL HIGH (ref 70–99)

## 2022-07-30 LAB — APTT: aPTT: 33 seconds (ref 24–36)

## 2022-07-30 MED ORDER — METOCLOPRAMIDE HCL 5 MG/ML IJ SOLN
10.0000 mg | Freq: Once | INTRAMUSCULAR | Status: AC
  Administered 2022-07-30: 10 mg via INTRAVENOUS
  Filled 2022-07-30: qty 2

## 2022-07-30 MED ORDER — IOHEXOL 350 MG/ML SOLN
75.0000 mL | Freq: Once | INTRAVENOUS | Status: AC | PRN
  Administered 2022-07-30: 75 mL via INTRAVENOUS

## 2022-07-30 MED ORDER — KETOROLAC TROMETHAMINE 30 MG/ML IJ SOLN
30.0000 mg | Freq: Once | INTRAMUSCULAR | Status: AC
  Administered 2022-07-30: 30 mg via INTRAVENOUS
  Filled 2022-07-30: qty 1

## 2022-07-30 MED ORDER — DIPHENHYDRAMINE HCL 50 MG/ML IJ SOLN
12.5000 mg | Freq: Once | INTRAMUSCULAR | Status: AC
  Administered 2022-07-30: 12.5 mg via INTRAVENOUS
  Filled 2022-07-30: qty 1

## 2022-07-30 MED ORDER — SODIUM CHLORIDE 0.9 % IV SOLN: INTRAVENOUS | Status: DC

## 2022-07-30 NOTE — Progress Notes (Signed)
CODE STROKE CT 1447 started 1448 end exam 7241 images sent to [facility], [facility] called, Completed in EPIC

## 2022-07-30 NOTE — Progress Notes (Addendum)
Telestroke RN Note:  Elert 1292 - pt already to CT and back at time of elert  1450 page sent 1455 Dr.Bhagat on screen  LNW 1200 - L sided weakness, headache mRS 2

## 2022-07-30 NOTE — ED Provider Notes (Signed)
[facility] EMERGENCY DEPARTMENT Provider Note   CSN: [id number] Arrival date & time: 07/30/22  1430     History  Chief Complaint  Patient presents with   Weakness    ESGAR BARNICK is a 66 y.o. male.  Pt is a 66 yo male with a pmhx significant for CAD s/p stents and CABG, asthma, anxiety, HLD, GERD, DM2, migraines, arthritis, HTN, OSA, cirrhosis, thrombocytopenia and previous TIA.  Ptnoticed that his right leg was weak and he felt off balance.  Sx started around noon.  Pt also has double vision, but he has this frequently.  Code stroke called in triage.       Home Medications Prior to Admission medications   Medication Sig Start Date End Date Taking? Authorizing Provider  albuterol (PROVENTIL HFA;VENTOLIN HFA) 108 (90 BASE) MCG/ACT inhaler Inhale 2 puffs into the lungs every 6 (six) hours as needed for wheezing or shortness of breath.    [provider]  aspirin EC 81 MG tablet Take 2 tablets (162 mg total) by mouth at bedtime. Patient taking differently: Take 81 mg by mouth at bedtime. 07/28/21   Barton Dubois, MD  carvedilol (COREG) 3.125 MG tablet TAKE 1 TABLET BY MOUTH TWICE DAILY 02/16/22   Montez Morita, Quillian Quince, MD  citalopram (CELEXA) 20 MG tablet Take 20 mg by mouth at bedtime.     [provider]  cyclobenzaprine (FLEXERIL) 10 MG tablet Take 1 tablet (10 mg total) by mouth 2 (two) times daily as needed for muscle spasms. Patient not taking: Reported on 04/13/2022 01/07/22   Marcello Fennel, PA-C  cyclobenzaprine (FLEXERIL) 10 MG tablet Take 1 tablet (10 mg total) by mouth 3 (three) times daily as needed for muscle spasms. 04/17/22   Earnie Larsson, MD  fluticasone (FLONASE) 50 MCG/ACT nasal spray Place 2 sprays into the nose 2 (two) times daily.    [provider]  Fluticasone-Salmeterol (ADVAIR) 250-50 MCG/DOSE AEPB Inhale 1 puff into the lungs every 12 (twelve) hours.    [provider]  furosemide (LASIX) 20 MG tablet Take 20 mg by  mouth daily. 10/14/19   [provider]  HUMALOG KWIKPEN 100 UNIT/ML KwikPen Inject 5-30 Units into the skin 3 (three) times daily as needed (blood sugar of 200 or higher). 01/08/22   [provider]  HYDROcodone-acetaminophen (NORCO/VICODIN) 5-325 MG tablet Take 1 tablet by mouth every 4 (four) hours as needed for moderate pain ((score 4 to 6)). 04/17/22   Earnie Larsson, MD  isosorbide mononitrate (IMDUR) 60 MG 24 hr tablet TAKE 1 TABLET BY MOUTH DAILY 07/29/22   [name], MD  Melatonin 5 MG TABS Take 10 mg by mouth at bedtime.    [provider]  metFORMIN (GLUCOPHAGE) 1000 MG tablet Take 1 tablet (1,000 mg total) by mouth 2 (two) times daily. For the next 2 days as you did receive IV contrast, then resume on 11/26/2018 Patient taking differently: Take 1,000 mg by mouth 2 (two) times daily. 11/23/18   Elgergawy, Silver Huguenin, MD  naproxen (NAPROSYN) 500 MG tablet Take 1 tablet (500 mg total) by mouth 2 (two) times daily. Patient not taking: Reported on 04/13/2022 12/12/21   Sherrill Raring, PA-C  nitroGLYCERIN (NITROSTAT) 0.4 MG SL tablet Place 0.4 mg under the tongue every 5 (five) minutes x 3 doses as needed for chest pain (if no relief after 2nd dose, proceed to the ED for an evaluation or call 911).    [provider]  omeprazole (PRILOSEC) 40  MG capsule Take 40 mg by mouth daily.    [provider]  pravastatin (PRAVACHOL) 20 MG tablet Take 2 tablets (40 mg total) by mouth daily. Patient not taking: Reported on 04/13/2022 07/28/21   Barton Dubois, MD  TRESIBA FLEXTOUCH 100 UNIT/ML FlexTouch Pen Inject 65 Units into the skin in the morning and at bedtime. Inject 60 units in the AM and 70 units in the PM. Patient taking differently: Inject 60-70 Units into the skin See admin instructions. Inject 60 units in the AM and 70 units in the PM. 07/28/21   Barton Dubois, MD  zonisamide (ZONEGRAN) 100 MG capsule Take 100 mg by mouth at bedtime.    [provider]       Allergies    Divalproex sodium, Statins, Tramadol, Valproic acid, Gadolinium derivatives, and Tricor [fenofibrate]    Review of Systems   Review of Systems  HENT:         Double vision   Neurological:        "Off balance" L leg weakness  All other systems reviewed and are negative.   Physical Exam Updated Vital Signs BP 131/64   Pulse 65   Temp 98.2 F (36.8 C) (Oral)   Resp 16   Ht 5' 9"  (1.753 m)   Wt 102.1 kg   SpO2 97%   BMI 33.23 kg/m  Physical Exam Vitals and nursing note reviewed.  Constitutional:      Appearance: Normal appearance.  HENT:     Head: Normocephalic and atraumatic.     Right Ear: External ear normal.     Left Ear: External ear normal.     Nose: Nose normal.     Mouth/Throat:     Mouth: Mucous membranes are dry.     Pharynx: Oropharynx is clear.  Eyes:     Extraocular Movements: Extraocular movements intact.     Conjunctiva/sclera: Conjunctivae normal.     Pupils: Pupils are equal, round, and reactive to light.  Cardiovascular:     Rate and Rhythm: Normal rate and regular rhythm.     Pulses: Normal pulses.     Heart sounds: Normal heart sounds.  Pulmonary:     Effort: Pulmonary effort is normal.     Breath sounds: Normal breath sounds.  Abdominal:     General: Abdomen is flat. Bowel sounds are normal.     Palpations: Abdomen is soft.  Musculoskeletal:        General: Normal range of motion.     Cervical back: Normal range of motion and neck supple.  Skin:    General: Skin is warm.     Capillary Refill: Capillary refill takes less than 2 seconds.  Neurological:     General: No focal deficit present.     Mental Status: He is alert and oriented to person, place, and time.  Psychiatric:        Mood and Affect: Mood normal.        Behavior: Behavior normal.     ED Results / Procedures / Treatments   Labs (all labs ordered are listed, but only abnormal results are displayed) Labs Reviewed  CBC - Abnormal; Notable for the  following components:      Result Value   WBC 2.9 (*)    Platelets 71 (*)    All other components within normal limits  COMPREHENSIVE METABOLIC PANEL - Abnormal; Notable for the following components:   Glucose, Bld 121 (*)    Calcium 8.7 (*)  AST 50 (*)    All other components within normal limits  URINALYSIS, ROUTINE W REFLEX MICROSCOPIC - Abnormal; Notable for the following components:   Hgb urine dipstick SMALL (*)    Protein, ur 30 (*)    Bacteria, UA RARE (*)    All other components within normal limits  I-STAT CHEM 8, ED - Abnormal; Notable for the following components:   Glucose, Bld 117 (*)    All other components within normal limits  CBG MONITORING, ED - Abnormal; Notable for the following components:   Glucose-Capillary 120 (*)    All other components within normal limits  RESP PANEL BY RT-PCR (FLU A&B, COVID) ARPGX2  ETHANOL  PROTIME-INR  APTT  DIFFERENTIAL  RAPID URINE DRUG SCREEN, HOSP PERFORMED    EKG EKG Interpretation  Date/Time:  Thursday July 30 2022 14:59:57 EDT Ventricular Rate:  66 PR Interval:  150 QRS Duration: 88 QT Interval:  416 QTC Calculation: 436 R Axis:   36 Text Interpretation: Sinus rhythm No significant change since last tracing Confirmed by Isla Pence [phone]) on 07/30/2022 3:53:49 PM  Radiology CT ANGIO HEAD NECK W WO CM  Result Date: 07/30/2022 CLINICAL DATA:  Stroke/TIA, determine embolic source. EXAM: CT ANGIOGRAPHY HEAD AND NECK TECHNIQUE: Multidetector CT imaging of the head and neck was performed using the standard protocol during bolus administration of intravenous contrast. Multiplanar CT image reconstructions and MIPs were obtained to evaluate the vascular anatomy. Carotid stenosis measurements (when applicable) are obtained utilizing NASCET criteria, using the distal internal carotid diameter as the denominator. RADIATION DOSE REDUCTION: This exam was performed according to the departmental dose-optimization program  which includes automated exposure control, adjustment of the mA and/or kV according to patient size and/or use of iterative reconstruction technique. CONTRAST:  77m OMNIPAQUE IOHEXOL 350 MG/ML SOLN COMPARISON:  Noncontrast head CT performed earlier today 07/30/2022. MRI brain and MRI cervical spine performed earlier today 07/30/2022. CT angiogram head/neck 02/19/2020. FINDINGS: CTA NECK FINDINGS Aortic arch: Common origin of the innominate and left common carotid arteries. Atherosclerotic plaque within the visualized aortic arch and proximal major branch vessels of the neck. No hemodynamically significant innominate or proximal subclavian artery stenosis. Right carotid system: CCA and ICA patent within the neck. Atherosclerotic plaque within the CCA, about the carotid bifurcation and within the proximal ICA. Stenosis of up to 50% within the proximal ICA, progressed from the prior examination of 02/19/2020. Left carotid system: CCA and ICA patent within the neck. Atherosclerotic plaque about the carotid bifurcation and within the proximal ICA. Less than 50% stenosis of the proximal ICA, similar to the prior examination of 02/19/2020. Progressive plaque within the proximal ECA. Vertebral arteries: Vertebral arteries patent within the neck. The left vertebral artery is dominant. Atherosclerotic plaque at the origin of the right vertebral artery with at least moderate stenosis. Atherosclerotic plaque within the V3 right vertebral artery with moderate stenosis at this site. Atherosclerotic plaque at the origin of the left vertebral artery and within the proximal left V1 segment, with at least moderate stenosis at this site. Skeleton: Facet joint ankylosis on the left at C2-C3. Vertebral ankylosis at C3-C4, postoperative in appearance. Prior left C6 laminotomy. Cervical spondylosis, more fully characterized on the same day cervical spine MRI. No acute fracture or aggressive osseous lesion. Other neck: No neck mass or  cervical lymphadenopathy. Upper chest: No consolidation within the imaged lung apices. Prior median sternotomy/CABG. Review of the MIP images confirms the above findings CTA HEAD FINDINGS Anterior circulation: The intracranial internal  carotid arteries are patent. Atherosclerotic plaque within both vessels, left greater than right, with no more than mild stenosis. The M1 middle cerebral arteries are patent. No M2 proximal branch occlusion or high-grade proximal stenosis. The anterior cerebral arteries are patent. No intracranial aneurysm is identified. Posterior circulation: The intracranial vertebral arteries are patent. The basilar artery is patent. The posterior cerebral arteries are patent. Venous sinuses: Within the limitations of contrast timing, no convincing thrombus. Anatomic variants: As described. Review of the MIP images confirms the above findings IMPRESSION: CTA neck: 1. The common carotid and internal carotid arteries are patent within the neck. Atherosclerotic plaque, bilaterally. Most notably, there is a progressive atherosclerotic stenosis within the proximal right ICA of up to 50%. 2. Vertebral arteries patent within the neck. As before, there is at least moderate atherosclerotic narrowing of the proximal cervical vertebral arteries, bilaterally. Unchanged moderate atherosclerotic stenosis within the right V3 segment. 3.  Aortic Atherosclerosis (ICD10-I70.0). CTA head: 1. No intracranial large vessel occlusion is identified. 2. Atherosclerotic plaque within the intracranial internal carotid arteries with no more than mild stenosis. Electronically Signed   By: Kellie Simmering D.O.   On: 07/30/2022 17:00   MR BRAIN WO CONTRAST  Result Date: 07/30/2022 CLINICAL DATA:  Acute neuro deficit. Rule out stroke or migraine headache. EXAM: MRI HEAD WITHOUT CONTRAST MRI CERVICAL SPINE WITHOUT CONTRAST TECHNIQUE: Multiplanar, multiecho pulse sequences of the brain and surrounding structures, and cervical  spine, to include the craniocervical junction and cervicothoracic junction, were obtained without intravenous contrast. COMPARISON:  CT head 07/30/2022.  MRI head 07/28/2021 FINDINGS: MRI HEAD FINDINGS Brain: No acute infarction, hemorrhage, hydrocephalus, extra-axial collection or mass lesion. Mild white matter changes with few small white matter hyperintensities bilaterally unchanged from prior MRI. Vascular: Normal arterial flow voids Skull and upper cervical spine: No focal skeletal lesion. Sinuses/Orbits: Mild mucosal edema paranasal sinuses. Bilateral cataract extraction Other: None MRI CERVICAL SPINE FINDINGS Alignment: Normal Vertebrae: Negative for fracture or mass Cord: Normal signal and morphology Posterior Fossa, vertebral arteries, paraspinal tissues: Soft tissue hyperintensity posteriorly on the left at C6. This extends to the skin surface and is most compatible with prior posterior decompression on 05/06/2022. No fluid collection. Disc levels: C2-3: Negative C3-4: Solid interbody surgical fusion.  Negative for stenosis C4-5: Mild disc and mild facet degeneration.  Negative for stenosis C5-6: Mild disc and facet degeneration.  Negative for stenosis C6-7: Mild disc bulging.  Negative for stenosis C7-T1: Negative IMPRESSION: 1. No acute intracranial abnormality. Mild white matter changes, stable from the prior MRI 1 year ago. 2. Mild cervical degenerative change without significant stenosis or neural impingement. Postop changes posteriorly on the left at the C6 level. Electronically Signed   By: Franchot Gallo M.D.   On: 07/30/2022 16:37   MR CERVICAL SPINE WO CONTRAST  Result Date: 07/30/2022 CLINICAL DATA:  Acute neuro deficit. Rule out stroke or migraine headache. EXAM: MRI HEAD WITHOUT CONTRAST MRI CERVICAL SPINE WITHOUT CONTRAST TECHNIQUE: Multiplanar, multiecho pulse sequences of the brain and surrounding structures, and cervical spine, to include the craniocervical junction and cervicothoracic  junction, were obtained without intravenous contrast. COMPARISON:  CT head 07/30/2022.  MRI head 07/28/2021 FINDINGS: MRI HEAD FINDINGS Brain: No acute infarction, hemorrhage, hydrocephalus, extra-axial collection or mass lesion. Mild white matter changes with few small white matter hyperintensities bilaterally unchanged from prior MRI. Vascular: Normal arterial flow voids Skull and upper cervical spine: No focal skeletal lesion. Sinuses/Orbits: Mild mucosal edema paranasal sinuses. Bilateral cataract extraction Other: None MRI CERVICAL  SPINE FINDINGS Alignment: Normal Vertebrae: Negative for fracture or mass Cord: Normal signal and morphology Posterior Fossa, vertebral arteries, paraspinal tissues: Soft tissue hyperintensity posteriorly on the left at C6. This extends to the skin surface and is most compatible with prior posterior decompression on 05/06/2022. No fluid collection. Disc levels: C2-3: Negative C3-4: Solid interbody surgical fusion.  Negative for stenosis C4-5: Mild disc and mild facet degeneration.  Negative for stenosis C5-6: Mild disc and facet degeneration.  Negative for stenosis C6-7: Mild disc bulging.  Negative for stenosis C7-T1: Negative IMPRESSION: 1. No acute intracranial abnormality. Mild white matter changes, stable from the prior MRI 1 year ago. 2. Mild cervical degenerative change without significant stenosis or neural impingement. Postop changes posteriorly on the left at the C6 level. Electronically Signed   By: Franchot Gallo M.D.   On: 07/30/2022 16:37   CT HEAD CODE STROKE WO CONTRAST  Result Date: 07/30/2022 CLINICAL DATA:  Code stroke.  Neuro deficit, acute, stroke suspected EXAM: CT HEAD WITHOUT CONTRAST TECHNIQUE: Contiguous axial images were obtained from the base of the skull through the vertex without intravenous contrast. RADIATION DOSE REDUCTION: This exam was performed according to the departmental dose-optimization program which includes automated exposure control,  adjustment of the mA and/or kV according to patient size and/or use of iterative reconstruction technique. COMPARISON:  CT head December 12, 2021. FINDINGS: Brain: No evidence of acute vascular territory infarction, hemorrhage, hydrocephalus, extra-axial collection or mass lesion/mass effect. Vascular: No hyperdense vessel identified Skull: No acute fracture. Sinuses/Orbits: Clear sinuses.  No acute orbital findings. Other: No mastoid effusions. ASPECTS [facility] Stroke Program Early CT Score) total score (0-10 with 10 being normal): 10. IMPRESSION: 1. No evidence of acute intracranial abnormality. 2. ASPECTS is 10. Code stroke imaging results were communicated on 07/30/2022 at 2:55 pm to provider [facility] via telephone, who verbally acknowledged these results. Electronically Signed   By: Margaretha Sheffield M.D.   On: 07/30/2022 14:57    Procedures Procedures    Medications Ordered in ED Medications  0.9 %  sodium chloride infusion (0 mLs Intravenous Stopped 07/30/22 1850)  metoCLOPramide (REGLAN) injection 10 mg (10 mg Intravenous Given 07/30/22 1644)  diphenhydrAMINE (BENADRYL) injection 12.5 mg (12.5 mg Intravenous Given 07/30/22 1645)  iohexol (OMNIPAQUE) 350 MG/ML injection 75 mL (75 mLs Intravenous Contrast Given 07/30/22 1629)  ketorolac (TORADOL) 30 MG/ML injection 30 mg (30 mg Intravenous Given 07/30/22 1746)    ED Course/ Medical Decision Making/ A&P                           Medical Decision Making Risk Prescription drug management.   This patient presents to the ED for concern of cva, this involves an extensive number of treatment options, and is a complaint that carries with it a high risk of complications and morbidity.  The differential diagnosis includes cva, tia, migraine   Co morbidities that complicate the patient evaluation  CAD s/p stents and CABG, asthma, anxiety, HLD, GERD, DM2, migraines, arthritis, HTN, OSA, cirrhosis, thrombocytopenia and previous TIA   Additional  history obtained:  Additional history obtained from epic chart review External records from outside source obtained and reviewed including family   Lab Tests:  I Ordered, and personally interpreted labs.  The pertinent results include:  cbc with wbc 2.9, plt low 71 (chronic); ua neg; uds neg; covid/flu neg; cmp nl   Imaging Studies ordered:  I ordered imaging studies including ct head; MRI brain, MRI  cervical spine, CT angio head/neck I independently visualized and interpreted imaging which showed  CT head: IMPRESSION:  1. No evidence of acute intracranial abnormality.  2. ASPECTS is 10.  CT angio head/neck: IMPRESSION:  CTA neck:    1. The common carotid and internal carotid arteries are patent  within the neck. Atherosclerotic plaque, bilaterally. Most notably,  there is a progressive atherosclerotic stenosis within the proximal  right ICA of up to 50%.  2. Vertebral arteries patent within the neck. As before, there is at  least moderate atherosclerotic narrowing of the proximal cervical  vertebral arteries, bilaterally. Unchanged moderate atherosclerotic  stenosis within the right V3 segment.  3.  Aortic Atherosclerosis (ICD10-I70.0).    CTA head:    1. No intracranial large vessel occlusion is identified.  2. Atherosclerotic plaque within the intracranial internal carotid  arteries with no more than mild stenosis.  MRI: IMPRESSION:  1. No acute intracranial abnormality. Mild white matter changes,  stable from the prior MRI 1 year ago.  2. Mild cervical degenerative change without significant stenosis or  neural impingement. Postop changes posteriorly on the left at the C6  level.   I agree with the radiologist interpretation   Cardiac Monitoring:  The patient was maintained on a cardiac monitor.  I personally viewed and interpreted the cardiac monitored which showed an underlying rhythm of: nsr   Medicines ordered and prescription drug management:  I  ordered medication including reglan/benadryl/ivfs  for headache  Reevaluation of the patient after these medicines showed that the patient improved I have reviewed the patients home medicines and have made adjustments as needed   Test Considered:  MRI   Critical Interventions:  Code stroke   Consultations Obtained:  I requested consultation with the Neurologist (Dr. Curly Shores),  and discussed lab and imaging findings as well as pertinent plan - she recommends: Recommendations:  -MRI brain without contrast -MRI cervical spine without contrast -CTA head and neck -Work-up of chest pain, shortness of breath and lower extremity edema per ED -Migraine cocktail to be ordered by ED provider -If patient continues to have significant ambulatory dysfunction may need PT/OT evaluation -If imaging is reassuring against acute pathology or clinically significant new carotid stenosis, from a neurological perspective continue outpatient follow-up with [facility]   Problem List / ED Course:  Weakness + headache:  MRI and ct angio neg.  Pt is feeling better after migraine cocktail.  Pt is able to ambulate without any problems.  He is stable for d/c.  He is to f/u with his neurologist.  Return if worse.   Reevaluation:  After the interventions noted above, I reevaluated the patient and found that they have :improved   Social Determinants of Health:  Lives at home   Dispostion:  After consideration of the diagnostic results and the patients response to treatment, I feel that the patent would benefit from discharge with outpatient f/u.          Final Clinical Impression(s) / ED Diagnoses Final diagnoses:  Other migraine without status migrainosus, not intractable    Rx / DC Orders ED Discharge Orders     None         Isla Pence, MD 07/30/22 1901

## 2022-07-30 NOTE — Consult Note (Signed)
[facility] Telemedicine Consult  Consult Participants: Patient, bedside nurse Rodman Key, atrium nurse Kayla Location of the provider: [facility] hospital Location of the patient: [facility]  This consult was provided via telemedicine with 2-way video and audio communication. The patient/family was informed that care would be provided in this way and agreed to receive care in this manner.   Reason for Consult: Code stroke for left-sided weakness Requesting Physician: Isla Pence  CC: Left-sided weakness and numbness  History is obtained from: Patient and chart review  HPI: [name] [name] is a 66 y.o. 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  He notes when he was walking at noon he had onset of left arm and leg numbness and weakness.  This has happened before and has been diagnosed as a complicated migraine or TIA  He reports that headache during this episode as well as prior episodes, notes some stable double vision and some chest pain with shortness of breath which last occurred last night as well as some increasing ankle edema for the last week  He reports that he has taken Plavix in the past and thinks he is currently taking his aspirin.  Zonisamide is also on his medication list but he is not sure if he is on this medication or what his indication would have been  LKW: Noon Thrombolytic given?: No, due to thrombocytopenia with platelet count less than 100 IA performed?: No, NIH 3, exam not consistent with LVO Premorbid modified rankin  scale:      2 - Slight disability. Able to look after own affairs without assistance, but unable to carry out all previous activities. Time of teleneurologist evaluation: 2:55 PM  Exam: Current vital signs: BP (!) 147/78   Pulse 64   Resp 14   SpO2 100%  Vital signs in last 24 hours: Pulse Rate:  [64] 64 (09/21 1500) Resp:  [14] 14 (09/21 1500) BP: (147)/(78) 147/78 (09/21 1500) SpO2:  [100 %] 100 % (09/21 1500)   General: No acute distress Pulmonary: breathing comfortably Cardiac: Appears to be perfusing extremities well  NIH Stroke scale 1A: Level of Consciousness - 0 1B: Ask Month and Age - 0 1C: 'Blink Eyes' & 'Squeeze Hands' - 0 2: Test Horizontal Extraocular Movements - 0 3: Test Visual Fields - 0 (inconsistent on testing by 2 different bedside nurses, but patient not reporting any new clear visual field change just generally blurry vision) 4: Test Facial Palsy - 0 5A: Test Left Arm Motor Drift - 1 5B: Test Right Arm Motor Drift - 0 6A: Test Left Leg Motor Drift - 1 6B: Test Right Leg Motor Drift - 0 7: Test Limb Ataxia - 0 8: Test Sensation - 1 9: Test Language/Aphasia- 0 10: Test Dysarthria - 0 11: Test Extinction/Inattention - 0 NIHSS score: 3  Imaging Reviewed:  Head CT personally reviewed, agree with radiology: 1. No evidence of acute intracranial abnormality. 2. ASPECTS is 10.  Labs reviewed in epic and pertinent values follow:  Basic Metabolic Panel: Recent Labs  Lab 07/30/22 1501  NA 141  K 4.0  CL 105  GLUCOSE 117*  BUN  17  CREATININE 0.90    CBC: Recent Labs  Lab 07/30/22 1441 07/30/22 1501  WBC 2.9*  --   NEUTROABS 1.8  --   HGB 14.6 13.9  HCT 40.8 41.0  MCV 89.5  --   PLT 71*  --     Coagulation Studies: Recent Labs    07/30/22 1441  LABPROT 14.6  INR 1.2     Assessment: Recurrent left-sided numbness and weakness, differential includes stroke/TIA versus cervical myelopathy versus complicated migraine.  Given the  stereotyped nature, less likely TIA but will obtain MRI brain to rule out stroke  Given his symptoms, if this were a stroke/TIA etiology would most likely be small vessel.  For that I feel he is medically optimized on aspirin monotherapy; I would hesitate to add Plavix given his thrombocytopenia and cirrhosis  Recommendations:  -MRI brain without contrast -MRI cervical spine without contrast -CTA head and neck -Work-up of chest pain, shortness of breath and lower extremity edema per ED -Migraine cocktail to be ordered by ED provider -If patient continues to have significant ambulatory dysfunction may need PT/OT evaluation -If imaging is reassuring against acute pathology or clinically significant new carotid stenosis, from a neurological perspective continue outpatient follow-up with [facility]  This patient is receiving care for possible acute neurological changes. There was 40 minutes of care by this provider at the time of service, including time for direct evaluation via telemedicine, review of medical records, imaging studies and discussion of findings with providers, the patient and/or family.   Lesleigh Noe MD-PhD [facility] [phone]   If 8pm-8am, please page neurology on call as listed in [place].  CRITICAL CARE Performed by: Lorenza Chick   Total critical care time: 40 minutes  Critical care time was exclusive of separately billable procedures and treating other patients.  Critical care was necessary to treat or prevent imminent or life-threatening deterioration.  Critical care was time spent personally by me on the following activities: development of treatment plan with patient and/or surrogate as well as nursing, discussions with consultants, evaluation of patient's response to treatment, examination of patient, obtaining history from patient or surrogate, ordering and performing treatments and interventions, ordering and review of  laboratory studies, ordering and review of radiographic studies, pulse oximetry and re-evaluation of patient's condition.

## 2022-07-30 NOTE — ED Triage Notes (Addendum)
Pt to er, pt c/o weakness and balance problems. MD Steinl at bedside to evaluate pt.  Pt reports that around noon he got up and was a little off balance and noticed his L leg was weak.  States that he has had this before and was dx with a TIA.  Code stroke called, charge RN notified   Pt has L sided weakness and reports decreased sensation

## 2022-07-30 NOTE — ED Notes (Signed)
Pt reports mild double vision, particular when moving his eyes.

## 2022-08-04 ENCOUNTER — Encounter (HOSPITAL_COMMUNITY): Payer: Self-pay | Admitting: Hematology

## 2022-08-14 ENCOUNTER — Other Ambulatory Visit: Payer: Self-pay

## 2022-08-14 ENCOUNTER — Emergency Department (HOSPITAL_COMMUNITY): Payer: Medicare Other

## 2022-08-14 ENCOUNTER — Encounter (HOSPITAL_COMMUNITY): Payer: Self-pay

## 2022-08-14 ENCOUNTER — Emergency Department (HOSPITAL_COMMUNITY)
Admission: EM | Admit: 2022-08-14 | Discharge: 2022-08-15 | Disposition: A | Discharge status: Home / Self Care | Payer: Medicare Other | Attending: Emergency Medicine | Admitting: Emergency Medicine

## 2022-08-14 DIAGNOSIS — R61 Generalized hyperhidrosis: Secondary | ICD-10-CM | POA: Diagnosis not present

## 2022-08-14 DIAGNOSIS — Z7984 Long term (current) use of oral hypoglycemic drugs: Secondary | ICD-10-CM | POA: Diagnosis not present

## 2022-08-14 DIAGNOSIS — R0602 Shortness of breath: Secondary | ICD-10-CM | POA: Insufficient documentation

## 2022-08-14 DIAGNOSIS — D72829 Elevated white blood cell count, unspecified: Secondary | ICD-10-CM | POA: Insufficient documentation

## 2022-08-14 DIAGNOSIS — Z7982 Long term (current) use of aspirin: Secondary | ICD-10-CM | POA: Diagnosis not present

## 2022-08-14 DIAGNOSIS — R079 Chest pain, unspecified: Secondary | ICD-10-CM | POA: Diagnosis not present

## 2022-08-14 DIAGNOSIS — E119 Type 2 diabetes mellitus without complications: Secondary | ICD-10-CM | POA: Diagnosis not present

## 2022-08-14 DIAGNOSIS — Z951 Presence of aortocoronary bypass graft: Secondary | ICD-10-CM | POA: Insufficient documentation

## 2022-08-14 DIAGNOSIS — Z8673 Personal history of transient ischemic attack (TIA), and cerebral infarction without residual deficits: Secondary | ICD-10-CM | POA: Diagnosis not present

## 2022-08-14 DIAGNOSIS — R110 Nausea: Secondary | ICD-10-CM | POA: Diagnosis not present

## 2022-08-14 DIAGNOSIS — R072 Precordial pain: Secondary | ICD-10-CM | POA: Insufficient documentation

## 2022-08-14 LAB — COMPREHENSIVE METABOLIC PANEL
ALT: 38 U/L (ref 0–44)
AST: 39 U/L (ref 15–41)
Albumin: 3.9 g/dL (ref 3.5–5.0)
Alkaline Phosphatase: 65 U/L (ref 38–126)
Anion gap: 9 (ref 5–15)
BUN: 18 mg/dL (ref 8–23)
CO2: 22 mmol/L (ref 22–32)
Calcium: 9.3 mg/dL (ref 8.9–10.3)
Chloride: 106 mmol/L (ref 98–111)
Creatinine, Ser: 0.98 mg/dL (ref 0.61–1.24)
GFR, Estimated: >60 mL/min (ref >60)
Glucose, Bld: 257 mg/dL — ABNORMAL HIGH (ref 70–99)
Potassium: 4.3 mmol/L (ref 3.5–5.1)
Sodium: 137 mmol/L (ref 135–145)
Total Bilirubin: 1 mg/dL (ref 0.3–1.2)
Total Protein: 7.8 g/dL (ref 6.5–8.1)

## 2022-08-14 LAB — CBC WITH DIFFERENTIAL/PLATELET
Abs Immature Granulocytes: 0.01 10*3/uL (ref 0.00–0.07)
Basophils Absolute: 0 10*3/uL (ref 0.0–0.1)
Basophils Relative: 0 %
Eosinophils Absolute: 0.1 10*3/uL (ref 0.0–0.5)
Eosinophils Relative: 2 %
HCT: 44.1 % (ref 39.0–52.0)
Hemoglobin: 15.8 g/dL (ref 13.0–17.0)
Immature Granulocytes: 0 %
Lymphocytes Relative: 32 %
Lymphs Abs: 0.8 10*3/uL (ref 0.7–4.0)
MCH: 32.2 pg (ref 26.0–34.0)
MCHC: 35.8 g/dL (ref 30.0–36.0)
MCV: 90 fL (ref 80.0–100.0)
Monocytes Absolute: 0.2 10*3/uL (ref 0.1–1.0)
Monocytes Relative: 7 %
Neutro Abs: 1.5 10*3/uL — ABNORMAL LOW (ref 1.7–7.7)
Neutrophils Relative %: 59 %
Platelets: 80 10*3/uL — ABNORMAL LOW (ref 150–400)
RBC: 4.9 MIL/uL (ref 4.22–5.81)
RDW: 13.2 % (ref 11.5–15.5)
WBC: 2.5 10*3/uL — ABNORMAL LOW (ref 4.0–10.5)
nRBC: 0 % (ref 0.0–0.2)

## 2022-08-14 LAB — TROPONIN I (HIGH SENSITIVITY): Troponin I (High Sensitivity): 4 ng/L (ref <18)

## 2022-08-14 MED ORDER — ASPIRIN 81 MG PO CHEW
324.0000 mg | CHEWABLE_TABLET | Freq: Once | ORAL | Status: AC
  Administered 2022-08-14: 324 mg via ORAL
  Filled 2022-08-14: qty 4

## 2022-08-14 NOTE — ED Notes (Signed)
X-ray at bedside

## 2022-08-14 NOTE — ED Provider Notes (Addendum)
[facility] EMERGENCY DEPARTMENT Provider Note   CSN: [id number] Arrival date & time: 08/14/22  2152     History  Chief Complaint  Patient presents with   Chest Pain    [name] [name] is a 66 y.o. male.  Patient with 2 days of intermittent substernal chest pain.  Says normally last just for 3 minutes maybe a few times it lasted as long as 15 minutes.  Patient took some nitroglycerin at home today because he felt things were worse today with some improvement but it did not completely relieve the pain.  Associated with some shortness of breath diaphoresis and some nausea.  Patient had a CABG in 2017 followed by Dr. Domenic Polite cardiology here in [place].  Patient states he did not take any aspirin.  Prior to the CABG patient had multiple prior stents.  So known to have fairly significant cardiovascular disease.  Past medical history also significant for hypercholesteremia gastroesophageal reflux disease type 2 diabetes history of thrombocytopenia status post CABG in 2017 obstructive sleep apnea cirrhosis of the liver history of TIA.  Patient's most recent heart cath was in 39 teen.  Gallbladder removal in 2020 patient does not use tobacco products.        Home Medications Prior to Admission medications   Medication Sig Start Date End Date Taking? Authorizing Provider  albuterol (PROVENTIL HFA;VENTOLIN HFA) 108 (90 BASE) MCG/ACT inhaler Inhale 2 puffs into the lungs every 6 (six) hours as needed for wheezing or shortness of breath.    [provider]  aspirin EC 81 MG tablet Take 2 tablets (162 mg total) by mouth at bedtime. Patient taking differently: Take 81 mg by mouth at bedtime. 07/28/21   Barton Dubois, MD  carvedilol (COREG) 3.125 MG tablet TAKE 1 TABLET BY MOUTH TWICE DAILY 02/16/22   Montez Morita, Quillian Quince, MD  citalopram (CELEXA) 20 MG tablet Take 20 mg by mouth at bedtime.     [provider]  cyclobenzaprine (FLEXERIL) 10 MG tablet Take 1 tablet (10 mg  total) by mouth 2 (two) times daily as needed for muscle spasms. Patient not taking: Reported on 04/13/2022 01/07/22   Marcello Fennel, PA-C  cyclobenzaprine (FLEXERIL) 10 MG tablet Take 1 tablet (10 mg total) by mouth 3 (three) times daily as needed for muscle spasms. 04/17/22   Earnie Larsson, MD  fluticasone (FLONASE) 50 MCG/ACT nasal spray Place 2 sprays into the nose 2 (two) times daily.    [provider]  Fluticasone-Salmeterol (ADVAIR) 250-50 MCG/DOSE AEPB Inhale 1 puff into the lungs every 12 (twelve) hours.    [provider]  furosemide (LASIX) 20 MG tablet Take 20 mg by mouth daily. 10/14/19   [provider]  HUMALOG KWIKPEN 100 UNIT/ML KwikPen Inject 5-30 Units into the skin 3 (three) times daily as needed (blood sugar of 200 or higher). 01/08/22   [provider]  HYDROcodone-acetaminophen (NORCO/VICODIN) 5-325 MG tablet Take 1 tablet by mouth every 4 (four) hours as needed for moderate pain ((score 4 to 6)). 04/17/22   Earnie Larsson, MD  isosorbide mononitrate (IMDUR) 60 MG 24 hr tablet TAKE 1 TABLET BY MOUTH DAILY 07/29/22   [name], MD  Melatonin 5 MG TABS Take 10 mg by mouth at bedtime.    [provider]  metFORMIN (GLUCOPHAGE) 1000 MG tablet Take 1 tablet (1,000 mg total) by mouth 2 (two) times daily. For the next 2 days as you did receive IV contrast, then resume on 11/26/2018 Patient taking  differently: Take 1,000 mg by mouth 2 (two) times daily. 11/23/18   Elgergawy, Silver Huguenin, MD  naproxen (NAPROSYN) 500 MG tablet Take 1 tablet (500 mg total) by mouth 2 (two) times daily. Patient not taking: Reported on 04/13/2022 12/12/21   Sherrill Raring, PA-C  nitroGLYCERIN (NITROSTAT) 0.4 MG SL tablet Place 0.4 mg under the tongue every 5 (five) minutes x 3 doses as needed for chest pain (if no relief after 2nd dose, proceed to the ED for an evaluation or call 911).    [provider]  omeprazole (PRILOSEC) 40 MG capsule Take 40 mg by mouth daily.     [provider]  pravastatin (PRAVACHOL) 20 MG tablet Take 2 tablets (40 mg total) by mouth daily. Patient not taking: Reported on 04/13/2022 07/28/21   Barton Dubois, MD  TRESIBA FLEXTOUCH 100 UNIT/ML FlexTouch Pen Inject 65 Units into the skin in the morning and at bedtime. Inject 60 units in the AM and 70 units in the PM. Patient taking differently: Inject 60-70 Units into the skin See admin instructions. Inject 60 units in the AM and 70 units in the PM. 07/28/21   Barton Dubois, MD  zonisamide (ZONEGRAN) 100 MG capsule Take 100 mg by mouth at bedtime.    [provider]      Allergies    Divalproex sodium, Statins, Tramadol, Valproic acid, Gadolinium derivatives, and Tricor [fenofibrate]    Review of Systems   Review of Systems  Constitutional:  Positive for diaphoresis. Negative for chills and fever.  HENT:  Negative for ear pain and sore throat.   Eyes:  Negative for pain and visual disturbance.  Respiratory:  Positive for shortness of breath. Negative for cough.   Cardiovascular:  Positive for chest pain. Negative for palpitations and leg swelling.  Gastrointestinal:  Positive for nausea. Negative for abdominal pain and vomiting.  Genitourinary:  Negative for dysuria and hematuria.  Musculoskeletal:  Negative for arthralgias and back pain.  Skin:  Negative for color change and rash.  Neurological:  Negative for seizures and syncope.  All other systems reviewed and are negative.   Physical Exam Updated Vital Signs BP 139/68   Pulse 65   Temp 98.1 F (36.7 C) (Oral)   Resp 15   Ht 1.753 m (5' 9" )   Wt 102.1 kg   SpO2 97%   BMI 33.23 kg/m  Physical Exam Vitals and nursing note reviewed.  Constitutional:      General: He is not in acute distress.    Appearance: He is well-developed.  HENT:     Head: Normocephalic and atraumatic.  Eyes:     Conjunctiva/sclera: Conjunctivae normal.  Cardiovascular:     Rate and Rhythm: Normal rate and regular rhythm.      Heart sounds: No murmur heard. Pulmonary:     Effort: Pulmonary effort is normal. No respiratory distress.     Breath sounds: Normal breath sounds. No decreased breath sounds, wheezing, rhonchi or rales.  Chest:     Chest wall: No tenderness.  Abdominal:     Palpations: Abdomen is soft.     Tenderness: There is no abdominal tenderness.  Musculoskeletal:        General: No swelling.     Cervical back: Neck supple.     Right lower leg: No edema.     Left lower leg: No edema.  Skin:    General: Skin is warm and dry.     Capillary Refill: Capillary refill takes less than 2  seconds.  Neurological:     Mental Status: He is alert.  Psychiatric:        Mood and Affect: Mood normal.     ED Results / Procedures / Treatments   Labs (all labs ordered are listed, but only abnormal results are displayed) Labs Reviewed  CBC WITH DIFFERENTIAL/PLATELET - Abnormal; Notable for the following components:      Result Value   WBC 2.5 (*)    Platelets 80 (*)    Neutro Abs 1.5 (*)    All other components within normal limits  COMPREHENSIVE METABOLIC PANEL  TROPONIN I (HIGH SENSITIVITY)    EKG None  Radiology No results found.  Procedures Procedures    Medications Ordered in ED Medications  aspirin chewable tablet 324 mg (324 mg Oral Given 08/14/22 2217)    ED Course/ Medical Decision Making/ A&P                           Medical Decision Making Amount and/or Complexity of Data Reviewed Labs: ordered. Radiology: ordered.  Risk OTC drugs.  Patient's EKG did not crossover but sinus rhythm with a heart rate of 67 QRS 87 QT corrected 430.  May be some subtle ST segment depression inferiorly and perhaps a little bit in V4 V5 no significant ST segment elevation.  We will get delta troponins we will get chest x-ray patient is oxygen sats are good.  CBC is back white count 2.5 hemoglobin 15.8 platelets at 80,000.  Patient with a history of thrombocytopenia.  Complete metabolic  panel pending first troponin pending but will need delta troponins and chest x-ray pending.  Patient will be given aspirin here.  Complete metabolic panel glucose 865 electrolytes normal renal function normal.  Initial troponin for delta troponin will be required.  Initial chest x-ray without any acute or active disease.   Final Clinical Impression(s) / ED Diagnoses Final diagnoses:  Precordial pain    Rx / DC Orders ED Discharge Orders     None         Fredia Sorrow, MD 08/14/22 2244    Fredia Sorrow, MD 08/14/22 2312

## 2022-08-14 NOTE — ED Triage Notes (Signed)
Pt to ED from home with c/o chest pain with radiation to left neck/shoulder that started 2 days ago, got worse tonight, also reports having nausea, and diaphoresis with pain. Hx of CABG in 2017. Pt took 2 nitro at home with some relief, last dose at 8pm. Pain is described as sharp and intermittent, currently rates 3 of 10, at worst 10/10.

## 2022-08-15 LAB — TROPONIN I (HIGH SENSITIVITY): Troponin I (High Sensitivity): 3 ng/L (ref <18)

## 2022-08-15 NOTE — ED Provider Notes (Signed)
  Physical Exam  BP (!) 150/72   Pulse 71   Temp 98.1 F (36.7 C) (Oral)   Resp 10   Ht 5' 9"  (1.753 m)   Wt 102.1 kg   SpO2 96%   BMI 33.23 kg/m   Physical Exam Vitals and nursing note reviewed.  Constitutional:      General: He is not in acute distress.    Appearance: He is well-developed. He is not diaphoretic.  HENT:     Head: Normocephalic and atraumatic.  Cardiovascular:     Rate and Rhythm: Normal rate and regular rhythm.     Heart sounds: No murmur heard.    No friction rub.  Pulmonary:     Effort: Pulmonary effort is normal. No respiratory distress.     Breath sounds: Normal breath sounds. No wheezing or rales.  Abdominal:     General: Bowel sounds are normal. There is no distension.     Palpations: Abdomen is soft.     Tenderness: There is no abdominal tenderness.  Musculoskeletal:        General: Normal range of motion.     Cervical back: Normal range of motion and neck supple.  Skin:    General: Skin is warm and dry.  Neurological:     Mental Status: He is alert and oriented to person, place, and time.     Coordination: Coordination normal.     Procedures  Procedures  ED Course / MDM  Care assumed from Dr. Rogene Houston at shift change.  Patient presenting here with chest discomfort as described in the HPI.  Care was signed out to me awaiting results of second troponin and patient reassessment.  Upon reassessment, patient is feeling improved and is symptom-free at this moment.  His EKG is unchanged and second troponin has returned as negative.  I feel as though patient can safely be discharged with return as needed.  He will be given a work excuse and advised to follow-up next week.       Veryl Speak, MD 08/15/22 [phone]

## 2022-08-15 NOTE — Discharge Instructions (Signed)
Continue medications as previously prescribed.  Follow-up with Dr. Domenic Polite next week, and return to the ER if you develop worsening chest pain, difficulty breathing, or for other new and concerning symptoms.
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
## 2022-08-18 ENCOUNTER — Encounter: Payer: Self-pay | Admitting: Nurse Practitioner

## 2022-08-18 ENCOUNTER — Ambulatory Visit: Payer: Medicare Other | Attending: Nurse Practitioner | Admitting: Nurse Practitioner

## 2022-08-18 ENCOUNTER — Encounter: Payer: Self-pay | Admitting: *Deleted

## 2022-08-18 VITALS — BP 120/60 | HR 61 | Ht 69.0 in | Wt 227.0 lb

## 2022-08-18 DIAGNOSIS — G459 Transient cerebral ischemic attack, unspecified: Secondary | ICD-10-CM

## 2022-08-18 DIAGNOSIS — E785 Hyperlipidemia, unspecified: Secondary | ICD-10-CM

## 2022-08-18 DIAGNOSIS — I2510 Atherosclerotic heart disease of native coronary artery without angina pectoris: Secondary | ICD-10-CM | POA: Diagnosis not present

## 2022-08-18 DIAGNOSIS — I200 Unstable angina: Secondary | ICD-10-CM | POA: Diagnosis not present

## 2022-08-18 DIAGNOSIS — I10 Essential (primary) hypertension: Secondary | ICD-10-CM | POA: Diagnosis not present

## 2022-08-18 MED ORDER — ISOSORBIDE MONONITRATE ER 60 MG PO TB24: ORAL_TABLET | ORAL | 1 refills | Status: DC

## 2022-08-18 MED ORDER — SODIUM CHLORIDE 0.9% FLUSH: 3.0000 mL | Freq: Two times a day (BID) | INTRAVENOUS | Status: DC

## 2022-08-18 NOTE — Patient Instructions (Addendum)
Medication Instructions:   INCREASE Imdur one (1) tablet by mouth ( 60 mg) in the am and one half (30 mg) in the pm.  *If you need a refill on your cardiac medications before your next appointment, please call your pharmacy*   Lab Work:  None ordered.  If you have labs (blood work) drawn today and your tests are completely normal, you will receive your results only by: [place] (if you have MyChart) OR A paper copy in the mail If you have any lab test that is abnormal or we need to change your treatment, we will call you to review the results.   Testing/Procedures:       Cardiac/Peripheral Catheterization   You are scheduled for a Cardiac Catheterization on Thursday, October 12 with Dr. Lauree Chandler.  1. Please arrive at the Main Entrance A at [facility]: [place], [place] [address] on October 12 at 5:30 AM (This time is two hours before your procedure to ensure your preparation). Free valet parking service is available. You will check in at ADMITTING. The support person will be asked to wait in the waiting room.  It is OK to have someone drop you off and come back when you are ready to be discharged.        Special note: Every effort is made to have your procedure done on time. Please understand that emergencies sometimes delay scheduled procedures.   . 2. Diet: Do not eat solid foods after midnight.  You may have clear liquids until 5 AM the day of the procedure.  3.  Medication instructions in preparation for your procedure:   Contrast Allergy: No  Take only 35 units of insulin the night before your procedure. Do not take any insulin on the day of the procedure.  Do not take Diabetes Med Glucophage (Metformin) on the day of the procedure and HOLD 48 HOURS AFTER THE PROCEDURE.  On the morning of your procedure, take Aspirin 81 mg and any morning medicines NOT listed above.  You may use sips of water.  5. Plan to go home the same day,  you will only stay overnight if medically necessary. 6. You MUST have a responsible adult to drive you home. 7. An adult MUST be with you the first 24 hours after you arrive home. 8. Bring a current list of your medications, and the last time and date medication taken. 9. Bring ID and current insurance cards. 10.Please wear clothes that are easy to get on and off and wear slip-on shoes.  Thank you for allowing [place] to care for you!   -- [facility]    Follow-Up: At [facility], you and your health needs are our priority.  As part of our continuing mission to provide you with exceptional heart care, we have created designated Provider Care Teams.  These Care Teams include your primary Cardiologist (physician) and Advanced Practice Providers (APPs -  Physician Assistants and Nurse Practitioners) who all work together to provide you with the care you need, when you need it.  We recommend signing up for the patient portal called "MyChart".  Sign up information is provided on this After Visit Summary.  MyChart is used to connect with patients for Virtual Visits (Telemedicine).  Patients are able to view lab/test results, encounter notes, upcoming appointments, etc.  Non-urgent messages can be sent to your provider as well.   To learn more about what you can do with  MyChart, go to [email or web address].    Your next appointment:   1 week(s)  The format for your next appointment:   In Person  Provider:   Ambrose Pancoast, NP         Patient was given a work note today.   Important Information About Sugar

## 2022-08-19 ENCOUNTER — Telehealth: Payer: Self-pay | Admitting: *Deleted

## 2022-08-19 NOTE — Telephone Encounter (Signed)
Cardiac Catheterization scheduled at [facility] for: Thursday August 20, 2022 7:30 AM Arrival time and place: [place] Entrance A at: 5:30 AM  Nothing to eat after midnight prior to procedure, clear liquids until 5 AM day of procedure.  Medication instructions: -Hold:  Insulin/Tresiba-AM of procedure/1/2 usual Insulin HS prior to procedure  Metformin-day of procedure and 48 hours post procedure -Except hold medications usual morning medications can be taken with sips of water including aspirin 81 mg.  Confirmed patient has responsible adult to drive home post procedure and be with patient first 24 hours after arriving home.  Patient reports no new symptoms concerning for COVID-19 in the past 10 days.  Reviewed procedure instructions with patient.

## 2022-08-20 ENCOUNTER — Other Ambulatory Visit: Payer: Self-pay

## 2022-08-20 ENCOUNTER — Encounter (HOSPITAL_COMMUNITY): Payer: Self-pay | Admitting: Cardiovascular Disease

## 2022-08-20 ENCOUNTER — Ambulatory Visit (HOSPITAL_COMMUNITY)
Admission: RE | Admit: 2022-08-20 | Discharge: 2022-08-20 | Disposition: A | Discharge status: Home / Self Care | Payer: Medicare Other | Attending: Cardiovascular Disease | Admitting: Cardiovascular Disease

## 2022-08-20 ENCOUNTER — Encounter (HOSPITAL_COMMUNITY)
Admission: RE | Disposition: A | Discharge status: Home / Self Care | Payer: Self-pay | Source: Home / Self Care | Attending: Cardiovascular Disease

## 2022-08-20 DIAGNOSIS — Z955 Presence of coronary angioplasty implant and graft: Secondary | ICD-10-CM | POA: Diagnosis not present

## 2022-08-20 DIAGNOSIS — E785 Hyperlipidemia, unspecified: Secondary | ICD-10-CM | POA: Insufficient documentation

## 2022-08-20 DIAGNOSIS — I2584 Coronary atherosclerosis due to calcified coronary lesion: Secondary | ICD-10-CM | POA: Insufficient documentation

## 2022-08-20 DIAGNOSIS — E782 Mixed hyperlipidemia: Secondary | ICD-10-CM | POA: Diagnosis not present

## 2022-08-20 DIAGNOSIS — I25118 Atherosclerotic heart disease of native coronary artery with other forms of angina pectoris: Secondary | ICD-10-CM | POA: Diagnosis not present

## 2022-08-20 DIAGNOSIS — Z794 Long term (current) use of insulin: Secondary | ICD-10-CM | POA: Diagnosis not present

## 2022-08-20 DIAGNOSIS — Z7982 Long term (current) use of aspirin: Secondary | ICD-10-CM | POA: Insufficient documentation

## 2022-08-20 DIAGNOSIS — E119 Type 2 diabetes mellitus without complications: Secondary | ICD-10-CM | POA: Insufficient documentation

## 2022-08-20 DIAGNOSIS — I2582 Chronic total occlusion of coronary artery: Secondary | ICD-10-CM | POA: Diagnosis not present

## 2022-08-20 DIAGNOSIS — I2510 Atherosclerotic heart disease of native coronary artery without angina pectoris: Secondary | ICD-10-CM

## 2022-08-20 DIAGNOSIS — Z8673 Personal history of transient ischemic attack (TIA), and cerebral infarction without residual deficits: Secondary | ICD-10-CM | POA: Insufficient documentation

## 2022-08-20 DIAGNOSIS — Z7984 Long term (current) use of oral hypoglycemic drugs: Secondary | ICD-10-CM | POA: Insufficient documentation

## 2022-08-20 DIAGNOSIS — I25708 Atherosclerosis of coronary artery bypass graft(s), unspecified, with other forms of angina pectoris: Secondary | ICD-10-CM | POA: Insufficient documentation

## 2022-08-20 DIAGNOSIS — I25810 Atherosclerosis of coronary artery bypass graft(s) without angina pectoris: Secondary | ICD-10-CM

## 2022-08-20 DIAGNOSIS — Z79899 Other long term (current) drug therapy: Secondary | ICD-10-CM | POA: Diagnosis not present

## 2022-08-20 DIAGNOSIS — T82855A Stenosis of coronary artery stent, initial encounter: Secondary | ICD-10-CM | POA: Insufficient documentation

## 2022-08-20 DIAGNOSIS — I10 Essential (primary) hypertension: Secondary | ICD-10-CM | POA: Diagnosis not present

## 2022-08-20 DIAGNOSIS — Y832 Surgical operation with anastomosis, bypass or graft as the cause of abnormal reaction of the patient, or of later complication, without mention of misadventure at the time of the procedure: Secondary | ICD-10-CM | POA: Insufficient documentation

## 2022-08-20 HISTORY — PX: LEFT HEART CATH AND CORS/GRAFTS ANGIOGRAPHY: CATH118250

## 2022-08-20 LAB — GLUCOSE, CAPILLARY
Glucose-Capillary: 194 mg/dL — ABNORMAL HIGH (ref 70–99)
Glucose-Capillary: 181 mg/dL — ABNORMAL HIGH (ref 70–99)

## 2022-08-20 SURGERY — LEFT HEART CATH AND CORS/GRAFTS ANGIOGRAPHY
Anesthesia: LOCAL

## 2022-08-20 MED ORDER — HEPARIN (PORCINE) IN NACL 1000-0.9 UT/500ML-% IV SOLN
INTRAVENOUS | Status: AC
  Filled 2022-08-20: qty 1000

## 2022-08-20 MED ORDER — IOHEXOL 350 MG/ML SOLN
INTRAVENOUS | Status: DC | PRN
  Administered 2022-08-20: 70 mL via INTRA_ARTERIAL

## 2022-08-20 MED ORDER — SODIUM CHLORIDE 0.9 % IV SOLN: 250.0000 mL | INTRAVENOUS | Status: DC | PRN

## 2022-08-20 MED ORDER — ACETAMINOPHEN 325 MG PO TABS: 650.0000 mg | ORAL_TABLET | ORAL | Status: DC | PRN

## 2022-08-20 MED ORDER — HEPARIN SODIUM (PORCINE) 1000 UNIT/ML IJ SOLN
INTRAMUSCULAR | Status: AC
  Filled 2022-08-20: qty 10

## 2022-08-20 MED ORDER — LABETALOL HCL 5 MG/ML IV SOLN: 10.0000 mg | INTRAVENOUS | Status: DC | PRN

## 2022-08-20 MED ORDER — ASPIRIN 81 MG PO CHEW
81.0000 mg | CHEWABLE_TABLET | ORAL | Status: AC
  Administered 2022-08-20: 81 mg via ORAL
  Filled 2022-08-20: qty 1

## 2022-08-20 MED ORDER — VERAPAMIL HCL 2.5 MG/ML IV SOLN
INTRAVENOUS | Status: DC | PRN
  Administered 2022-08-20: 10 mL via INTRA_ARTERIAL

## 2022-08-20 MED ORDER — FENTANYL CITRATE (PF) 100 MCG/2ML IJ SOLN
INTRAMUSCULAR | Status: AC
  Filled 2022-08-20: qty 2

## 2022-08-20 MED ORDER — LIDOCAINE HCL (PF) 1 % IJ SOLN
INTRAMUSCULAR | Status: AC
  Filled 2022-08-20: qty 30

## 2022-08-20 MED ORDER — HYDRALAZINE HCL 20 MG/ML IJ SOLN: 10.0000 mg | INTRAMUSCULAR | Status: DC | PRN

## 2022-08-20 MED ORDER — ONDANSETRON HCL 4 MG/2ML IJ SOLN: 4.0000 mg | Freq: Four times a day (QID) | INTRAMUSCULAR | Status: DC | PRN

## 2022-08-20 MED ORDER — FENTANYL CITRATE (PF) 100 MCG/2ML IJ SOLN
INTRAMUSCULAR | Status: DC | PRN
  Administered 2022-08-20: 50 ug via INTRAVENOUS

## 2022-08-20 MED ORDER — SODIUM CHLORIDE 0.9 % WEIGHT BASED INFUSION
3.0000 mL/kg/h | INTRAVENOUS | Status: AC
  Administered 2022-08-20: 3 mL/kg/h via INTRAVENOUS

## 2022-08-20 MED ORDER — MIDAZOLAM HCL 2 MG/2ML IJ SOLN
INTRAMUSCULAR | Status: DC | PRN
  Administered 2022-08-20: 1 mg via INTRAVENOUS

## 2022-08-20 MED ORDER — SODIUM CHLORIDE 0.9 % WEIGHT BASED INFUSION: 1.0000 mL/kg/h | INTRAVENOUS | Status: DC

## 2022-08-20 MED ORDER — LIDOCAINE HCL (PF) 1 % IJ SOLN
INTRAMUSCULAR | Status: DC | PRN
  Administered 2022-08-20: 5 mL

## 2022-08-20 MED ORDER — SODIUM CHLORIDE 0.9% FLUSH: 3.0000 mL | Freq: Two times a day (BID) | INTRAVENOUS | Status: DC

## 2022-08-20 MED ORDER — SODIUM CHLORIDE 0.9% FLUSH: 3.0000 mL | INTRAVENOUS | Status: DC | PRN

## 2022-08-20 MED ORDER — VERAPAMIL HCL 2.5 MG/ML IV SOLN
INTRAVENOUS | Status: AC
  Filled 2022-08-20: qty 2

## 2022-08-20 MED ORDER — MIDAZOLAM HCL 2 MG/2ML IJ SOLN
INTRAMUSCULAR | Status: AC
  Filled 2022-08-20: qty 2

## 2022-08-20 MED ORDER — SODIUM CHLORIDE 0.9 % IV SOLN: INTRAVENOUS | Status: AC

## 2022-08-20 MED ORDER — HEPARIN SODIUM (PORCINE) 1000 UNIT/ML IJ SOLN
INTRAMUSCULAR | Status: DC | PRN
  Administered 2022-08-20: 5000 [IU] via INTRAVENOUS

## 2022-08-20 MED ORDER — HEPARIN (PORCINE) IN NACL 1000-0.9 UT/500ML-% IV SOLN
INTRAVENOUS | Status: DC | PRN
  Administered 2022-08-20 (×2): 500 mL

## 2022-08-20 MED ORDER — ASPIRIN 81 MG PO CHEW: 81.0000 mg | CHEWABLE_TABLET | ORAL | Status: DC

## 2022-08-20 SURGICAL SUPPLY — 14 items
BAND CMPR LRG ZPHR (HEMOSTASIS) ×1
BAND ZEPHYR COMPRESS 30 LONG (HEMOSTASIS) IMPLANT
CATH INFINITI 5 FR AR2 MOD (CATHETERS) IMPLANT
CATH INFINITI 5 FR IM (CATHETERS) IMPLANT
CATH INFINITI 5 FR JL3.5 (CATHETERS) IMPLANT
CATH INFINITI 5FR AL1 (CATHETERS) IMPLANT
CATH INFINITI JR4 5F (CATHETERS) IMPLANT
GLIDESHEATH SLEND SS 6F .021 (SHEATH) IMPLANT
GUIDEWIRE INQWIRE 1.5J.035X260 (WIRE) IMPLANT
INQWIRE 1.5J .035X260CM (WIRE) ×1
KIT HEART LEFT (KITS) ×2 IMPLANT
PACK CARDIAC CATHETERIZATION (CUSTOM PROCEDURE TRAY) ×2 IMPLANT
TRANSDUCER W/STOPCOCK (MISCELLANEOUS) ×2 IMPLANT
TUBING CIL FLEX 10 FLL-RA (TUBING) ×2 IMPLANT

## 2022-08-20 NOTE — Interval H&P Note (Signed)
History and Physical Interval Note:  08/20/2022 7:16 AM  [name] [name]  has presented today for surgery, with the diagnosis of unstable angina.  The various methods of treatment have been discussed with the patient and family. After consideration of risks, benefits and other options for treatment, the patient has consented to  Procedure(s): LEFT HEART CATH AND CORS/GRAFTS ANGIOGRAPHY (N/A) as a surgical intervention.  The patient's history has been reviewed, patient examined, no change in status, stable for surgery.  I have reviewed the patient's chart and labs.  Questions were answered to the patient's satisfaction.    Cath Lab Visit (complete for each Cath Lab visit)  Clinical Evaluation Leading to the Procedure:   ACS: No.  Non-ACS:    Anginal Classification: CCS III  Anti-ischemic medical therapy: Maximal Therapy (2 or more classes of medications)  Non-Invasive Test Results: No non-invasive testing performed  Prior CABG: Previous CABG        Lauree Chandler
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
## 2022-08-27 ENCOUNTER — Ambulatory Visit: Payer: Medicare Other | Attending: Nurse Practitioner | Admitting: Nurse Practitioner

## 2022-08-27 ENCOUNTER — Encounter: Payer: Self-pay | Admitting: Nurse Practitioner

## 2022-08-27 VITALS — BP 114/56 | HR 68 | Ht 69.0 in | Wt 229.0 lb

## 2022-08-27 DIAGNOSIS — I2510 Atherosclerotic heart disease of native coronary artery without angina pectoris: Secondary | ICD-10-CM

## 2022-08-27 DIAGNOSIS — I10 Essential (primary) hypertension: Secondary | ICD-10-CM | POA: Diagnosis not present

## 2022-08-27 DIAGNOSIS — G459 Transient cerebral ischemic attack, unspecified: Secondary | ICD-10-CM | POA: Diagnosis not present

## 2022-08-27 DIAGNOSIS — E785 Hyperlipidemia, unspecified: Secondary | ICD-10-CM | POA: Diagnosis not present

## 2022-08-27 NOTE — Patient Instructions (Signed)
Medication Instructions:  Your physician has recommended you make the following change in your medication:   DISCONTINUE: Amlodipine  *If you need a refill on your cardiac medications before your next appointment, please call your pharmacy*   Lab Work: None If you have labs (blood work) drawn today and your tests are completely normal, you will receive your results only by: [place] (if you have MyChart) OR A paper copy in the mail If you have any lab test that is abnormal or we need to change your treatment, we will call you to review the results.   Follow-Up: At [facility], you and your health needs are our priority.  As part of our continuing mission to provide you with exceptional heart care, we have created designated Provider Care Teams.  These Care Teams include your primary Cardiologist (physician) and Advanced Practice Providers (APPs -  Physician Assistants and Nurse Practitioners) who all work together to provide you with the care you need, when you need it.   Your next appointment:   As scheduled  Other Instructions Check blood pressure for 2 weeks and send [place] those readings via MyChart or you can call.   Two Gram Sodium Diet 2000 mg  What is Sodium? Sodium is a mineral found naturally in many foods. The most significant source of sodium in the diet is table salt, which is about 40% sodium.  Processed, convenience, and preserved foods also contain a large amount of sodium.  The body needs only 500 mg of sodium daily to function,  A normal diet provides more than enough sodium even if you do not use salt.  Why Limit Sodium? A build up of sodium in the body can cause thirst, increased blood pressure, shortness of breath, and water retention.  Decreasing sodium in the diet can reduce edema and risk of heart attack or stroke associated with high blood pressure.  Keep in mind that there are many other factors involved in these health problems.  Heredity,  obesity, lack of exercise, cigarette smoking, stress and what you eat all play a role.  General Guidelines: Do not add salt at the table or in cooking.  One teaspoon of salt contains over 2 grams of sodium. Read food labels Avoid processed and convenience foods Ask your dietitian before eating any foods not dicussed in the menu planning guidelines Consult your physician if you wish to use a salt substitute or a sodium containing medication such as antacids.  Limit milk and milk products to 16 oz (2 cups) per day.  Shopping Hints: READ LABELS!! "Dietetic" does not necessarily mean low sodium. Salt and other sodium ingredients are often added to foods during processing.    Menu Planning Guidelines Food Group Choose More Often Avoid  Beverages (see also the milk group All fruit juices, low-sodium, salt-free vegetables juices, low-sodium carbonated beverages Regular vegetable or tomato juices, commercially softened water used for drinking or cooking  Breads and Cereals Enriched white, wheat, rye and pumpernickel bread, hard rolls and dinner rolls; muffins, cornbread and waffles; most dry cereals, cooked cereal without added salt; unsalted crackers and breadsticks; low sodium or homemade bread crumbs Bread, rolls and crackers with salted tops; quick breads; instant hot cereals; pancakes; commercial bread stuffing; self-rising flower and biscuit mixes; regular bread crumbs or cracker crumbs  Desserts and Sweets Desserts and sweets mad with mild should be within allowance Instant pudding mixes and cake mixes  Fats Butter or margarine; vegetable oils; unsalted salad dressings, regular salad  dressings limited to 1 Tbs; light, sour and heavy cream Regular salad dressings containing bacon fat, bacon bits, and salt pork; snack dips made with instant soup mixes or processed cheese; salted nuts  Fruits Most fresh, frozen and canned fruits Fruits processed with salt or sodium-containing ingredient (some dried  fruits are processed with sodium sulfites        Vegetables Fresh, frozen vegetables and low- sodium canned vegetables Regular canned vegetables, sauerkraut, pickled vegetables, and others prepared in brine; frozen vegetables in sauces; vegetables seasoned with ham, bacon or salt pork  Condiments, Sauces, Miscellaneous  Salt substitute with physician's approval; pepper, herbs, spices; vinegar, lemon or lime juice; hot pepper sauce; garlic powder, onion powder, low sodium soy sauce (1 Tbs.); low sodium condiments (ketchup, chili sauce, mustard) in limited amounts (1 tsp.) fresh ground horseradish; unsalted tortilla chips, pretzels, potato chips, popcorn, salsa (1/4 cup) Any seasoning made with salt including garlic salt, celery salt, onion salt, and seasoned salt; sea salt, rock salt, kosher salt; meat tenderizers; monosodium glutamate; mustard, regular soy sauce, barbecue, sauce, chili sauce, teriyaki sauce, steak sauce, Worcestershire sauce, and most flavored vinegars; canned gravy and mixes; regular condiments; salted snack foods, olives, picles, relish, horseradish sauce, catsup   Food preparation: Try these seasonings Meats:    Pork Sage, onion Serve with applesauce  Chicken Poultry seasoning, thyme, parsley Serve with cranberry sauce  Lamb Curry powder, rosemary, garlic, thyme Serve with mint sauce or jelly  Veal Marjoram, basil Serve with current jelly, cranberry sauce  Beef Pepper, bay leaf Serve with dry mustard, unsalted chive butter  Fish [place] leaf, dill Serve with unsalted lemon butter, unsalted parsley butter  Vegetables:    Asparagus Lemon juice   Broccoli Lemon juice   Carrots Mustard dressing parsley, mint, nutmeg, glazed with unsalted butter and sugar   Green beans Marjoram, lemon juice, nutmeg,dill seed   Tomatoes Basil, marjoram, onion   Spice /blend for [employer]" 4 tsp ground thyme 1 tsp ground sage 3 tsp ground rosemary 4 tsp ground marjoram   Test your  knowledge A product that says "Salt Free" may still contain sodium. True or False Garlic Powder and Hot Pepper Sauce an be used as alternative seasonings.True or False Processed foods have more sodium than fresh foods.  True or False Canned Vegetables have less sodium than froze True or False   WAYS TO DECREASE YOUR SODIUM INTAKE Avoid the use of added salt in cooking and at the table.  Table salt (and other prepared seasonings which contain salt) is probably one of the greatest sources of sodium in the diet.  Unsalted foods can gain flavor from the sweet, sour, and butter taste sensations of herbs and spices.  Instead of using salt for seasoning, try the following seasonings with the foods listed.  Remember: how you use them to enhance natural food flavors is limited only by your creativity... Allspice-Meat, fish, eggs, fruit, peas, red and yellow vegetables Almond Extract-Fruit baked goods Anise Seed-Sweet breads, fruit, carrots, beets, cottage cheese, cookies (tastes like licorice) Basil-Meat, fish, eggs, vegetables, rice, vegetables salads, soups, sauces Bay Leaf-Meat, fish, stews, poultry Burnet-Salad, vegetables (cucumber-like flavor) Caraway Seed-Bread, cookies, cottage cheese, meat, vegetables, cheese, rice Cardamon-Baked goods, fruit, soups Celery Powder or seed-Salads, salad dressings, sauces, meatloaf, soup, bread.Do not use  celery salt Chervil-Meats, salads, fish, eggs, vegetables, cottage cheese (parsley-like flavor) Chili Power-Meatloaf, chicken cheese, corn, eggplant, egg dishes Chives-Salads cottage cheese, egg dishes, soups, vegetables, sauces Cilantro-Salsa, casseroles Cinnamon-Baked goods, fruit, pork, lamb,  chicken, carrots Cloves-Fruit, baked goods, fish, pot roast, green beans, beets, carrots Coriander-Pastry, cookies, meat, salads, cheese (lemon-orange flavor) Cumin-Meatloaf, fish,cheese, eggs, cabbage,fruit pie (caraway flavor) [employer], fruit, eggs, fish,  poultry, cottage cheese, vegetables Dill Seed-Meat, cottage cheese, poultry, vegetables, fish, salads, bread Fennel Seed-Bread, cookies, apples, pork, eggs, fish, beets, cabbage, cheese, Licorice-like flavor Garlic-(buds or powder) Salads, meat, poultry, fish, bread, butter, vegetables, potatoes.Do not  use garlic salt Ginger-Fruit, vegetables, baked goods, meat, fish, poultry Horseradish Root-Meet, vegetables, butter Lemon Juice or Extract-Vegetables, fruit, tea, baked goods, fish salads Mace-Baked goods fruit, vegetables, fish, poultry (taste like nutmeg) Maple Extract-Syrups Marjoram-Meat, chicken, fish, vegetables, breads, green salads (taste like Sage) Mint-Tea, lamb, sherbet, vegetables, desserts, carrots, cabbage Mustard, Dry or Seed-Cheese, eggs, meats, vegetables, poultry Nutmeg-Baked goods, fruit, chicken, eggs, vegetables, desserts Onion Powder-Meat, fish, poultry, vegetables, cheese, eggs, bread, rice salads (Do not use   Onion salt) Orange Extract-Desserts, baked goods Oregano-Pasta, eggs, cheese, onions, pork, lamb, fish, chicken, vegetables, green salads Paprika-Meat, fish, poultry, eggs, cheese, vegetables Parsley Flakes-Butter, vegetables, meat fish, poultry, eggs, bread, salads (certain forms may   Contain sodium Pepper-Meat fish, poultry, vegetables, eggs Peppermint Extract-Desserts, baked goods Poppy Seed-Eggs, bread, cheese, fruit dressings, baked goods, noodles, vegetables, cottage  [employer], poultry, meat, fish, cauliflower, turnips,eggs bread Saffron-Rice, bread, veal, chicken, fish, eggs Sage-Meat, fish, poultry, onions, eggplant, tomateos, pork, stews Savory-Eggs, salads, poultry, meat, rice, vegetables, soups, pork Tarragon-Meat, poultry, fish, eggs, butter, vegetables (licorice-like flavor)  Thyme-Meat, poultry, fish, eggs, vegetables, (clover-like flavor), sauces, soups Tumeric-Salads, butter, eggs, fish, rice,  vegetables (saffron-like flavor) Vanilla Extract-Baked goods, candy Vinegar-Salads, vegetables, meat marinades Walnut Extract-baked goods, candy   2. Choose your Foods Wisely   The following is a list of foods to avoid which are high in sodium:  Meats-Avoid all smoked, canned, salt cured, dried and kosher meat and fish as well as Anchovies   [employer] meats:Bologna, Liverwurst, Pastrami Canned meat or fish  Marinated herring Caviar    Pepperoni Corned Beef   Pizza Dried chipped beef  Salami Frozen breaded fish or meat Salt pork Frankfurters or hot dogs  Sardines Gefilte fish   Sausage Ham (boiled ham, Proscuitto Smoked butt    spiced ham)   Spam      TV Dinners Vegetables Canned vegetables (Regular) Relish Canned mushrooms  Sauerkraut Olives    Tomato juice Pickles  Bakery and Dessert Products Canned puddings  Cream pies Cheesecake   Decorated cakes Cookies  Beverages/Juices Tomato juice, regular  Gatorade   V-8 vegetable juice, regular  Breads and Cereals Biscuit mixes   Salted potato chips, corn chips, pretzels Bread stuffing mixes  Salted crackers and rolls Pancake and waffle mixes Self-rising flour  Seasonings Accent    Meat sauces Barbecue sauce  Meat tenderizer Catsup    Monosodium glutamate (MSG) Celery salt   Onion salt Chili sauce   Prepared mustard Garlic salt   Salt, seasoned salt, sea salt Gravy mixes   Soy sauce Horseradish   Steak sauce Ketchup   Tartar sauce Lite salt    Teriyaki sauce Marinade mixes   Worcestershire sauce  Others Baking powder   Cocoa and cocoa mixes Baking soda   Commercial casserole mixes Candy-caramels, chocolate  Dehydrated soups    Bars, fudge,nougats  Instant rice and pasta mixes Canned broth or soup  Maraschino cherries Cheese, aged and processed cheese and cheese spreads  Learning Assessment Quiz  Indicated T (for True) or F (for False) for  each of the following statements:  _____ Fresh fruits and  vegetables and unprocessed grains are generally low in sodium _____ Water may contain a considerable amount of sodium, depending on the source _____ You can always tell if a food is high in sodium by tasting it _____ Certain laxatives my be high in sodium and should be avoided unless prescribed   by a physician or pharmacist _____ Salt substitutes may be used freely by anyone on a sodium restricted diet _____ Sodium is present in table salt, food additives and as a natural component of   most foods _____ Table salt is approximately 90% sodium _____ Limiting sodium intake may help prevent excess fluid accumulation in the body _____ On a sodium-restricted diet, seasonings such as bouillon soy sauce, and    cooking wine should be used in place of table salt _____ On an ingredient list, a product which lists monosodium glutamate as the first   ingredient is an appropriate food to include on a low sodium diet  Circle the best answer(s) to the following statements (Hint: there may be more than one correct answer)  11. On a low-sodium diet, some acceptable snack items are:    A. Olives  F. Bean dip   K. Grapefruit juice    B. Salted Pretzels G. Commercial Popcorn   L. Canned peaches    C. Carrot Sticks  H. Bouillon   M. Unsalted nuts   D. [place] fries  I. Peanut butter crackers N. Salami   E. Sweet pickles J. Tomato Juice   O. Pizza  12.  Seasonings that may be used freely on a reduced - sodium diet include   A. Lemon wedges F.Monosodium glutamate K. Celery seed    B.Soysauce   G. Pepper   L. Mustard powder   C. Sea salt  H. Cooking wine  M. Onion flakes   D. Vinegar  E. Prepared horseradish N. Salsa   E. Sage   J. Worcestershire sauce  O. Chutney   Important Information About Sugar

## 2022-09-03 ENCOUNTER — Telehealth: Payer: Self-pay

## 2022-09-03 NOTE — Telephone Encounter (Signed)
        Patient  visited [facility] on 08/15/2022  for precordial pain.   Telephone encounter attempt :  1st  A HIPAA compliant voice message was left requesting a return call.  Instructed patient to call back at [phone].   [place] Resource Care Guide   [email or web address]  ?? [phone]   Website: [email or web address]  [email or web address]

## 2022-09-07 ENCOUNTER — Telehealth: Payer: Self-pay

## 2022-09-07 NOTE — Telephone Encounter (Signed)
     Patient  visit on 08/15/2022  at [facility] was for Precordial pain.  Have you been able to follow up with your primary care physician? Has appointment scheduled 09/10/2022.  The patient was or was not able to obtain any needed medicine or equipment. No meds prescribed.  Are there diet recommendations that you are having difficulty following? No  Patient expresses understanding of discharge instructions and education provided has no other needs at this time.    [place] Resource Care Guide   [email or web address]  ?? [phone]   Website: [email or web address]  [email or web address]

## 2022-09-13 DIAGNOSIS — Z20822 Contact with and (suspected) exposure to covid-19: Secondary | ICD-10-CM | POA: Diagnosis not present

## 2022-09-13 DIAGNOSIS — J069 Acute upper respiratory infection, unspecified: Secondary | ICD-10-CM | POA: Diagnosis not present

## 2022-09-22 IMAGING — DX DG WRIST COMPLETE 3+V*R*
3 series · 3 of 3 positions shown · non-contrast
Comparison: None.

CLINICAL DATA: Pain following fall

EXAM:
RIGHT WRIST - COMPLETE 3+ VIEW

[wrist ap]
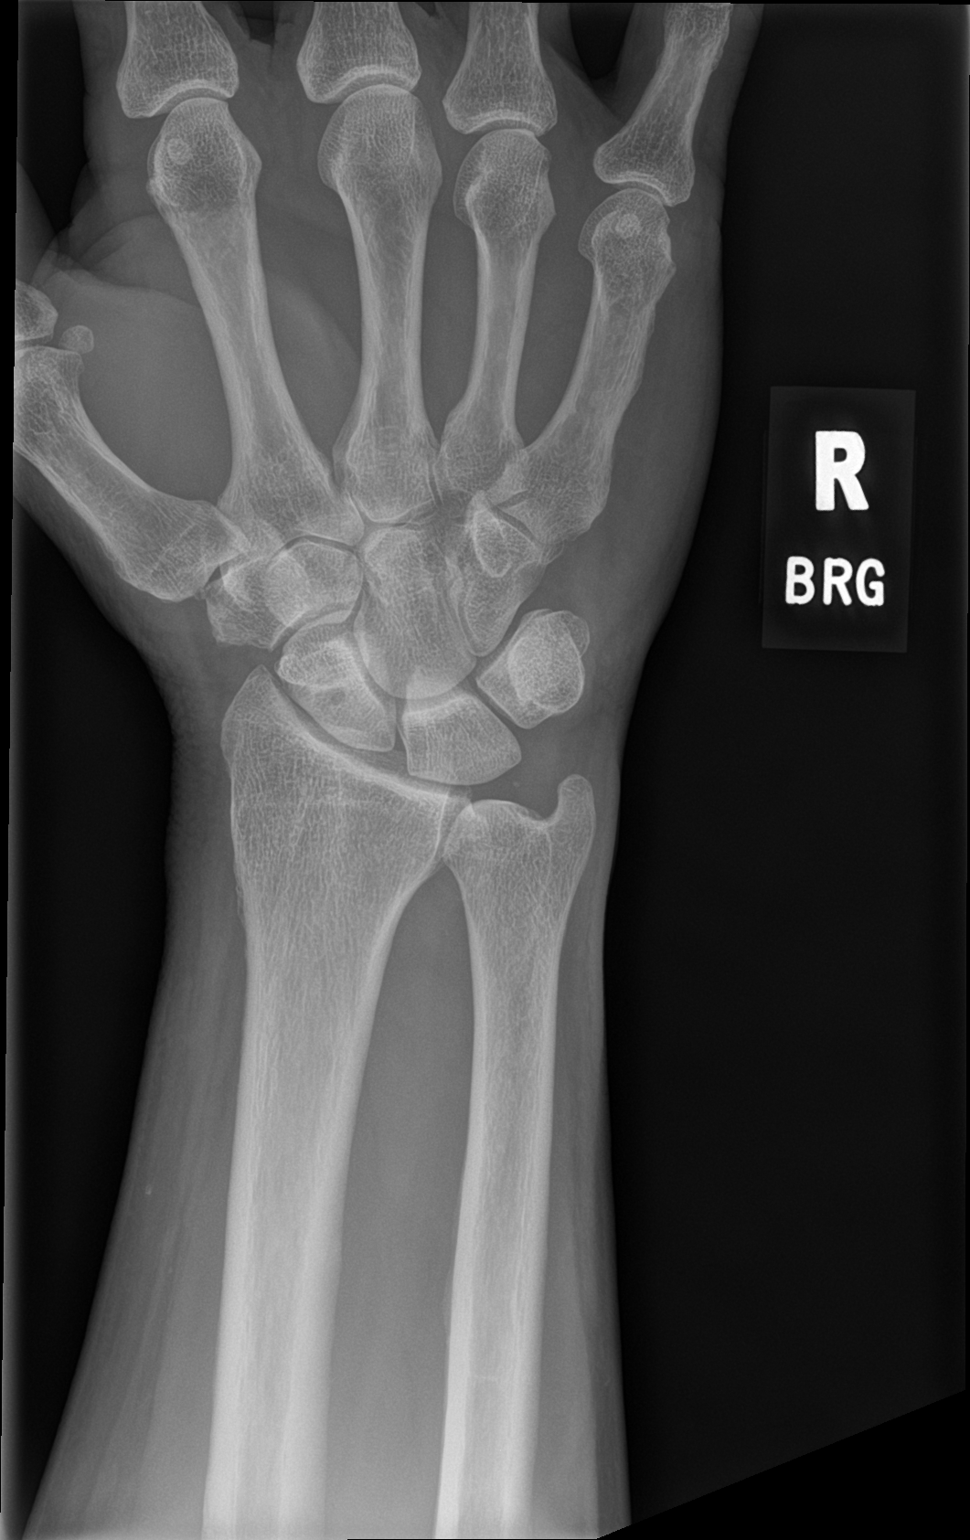

[wrist obl]
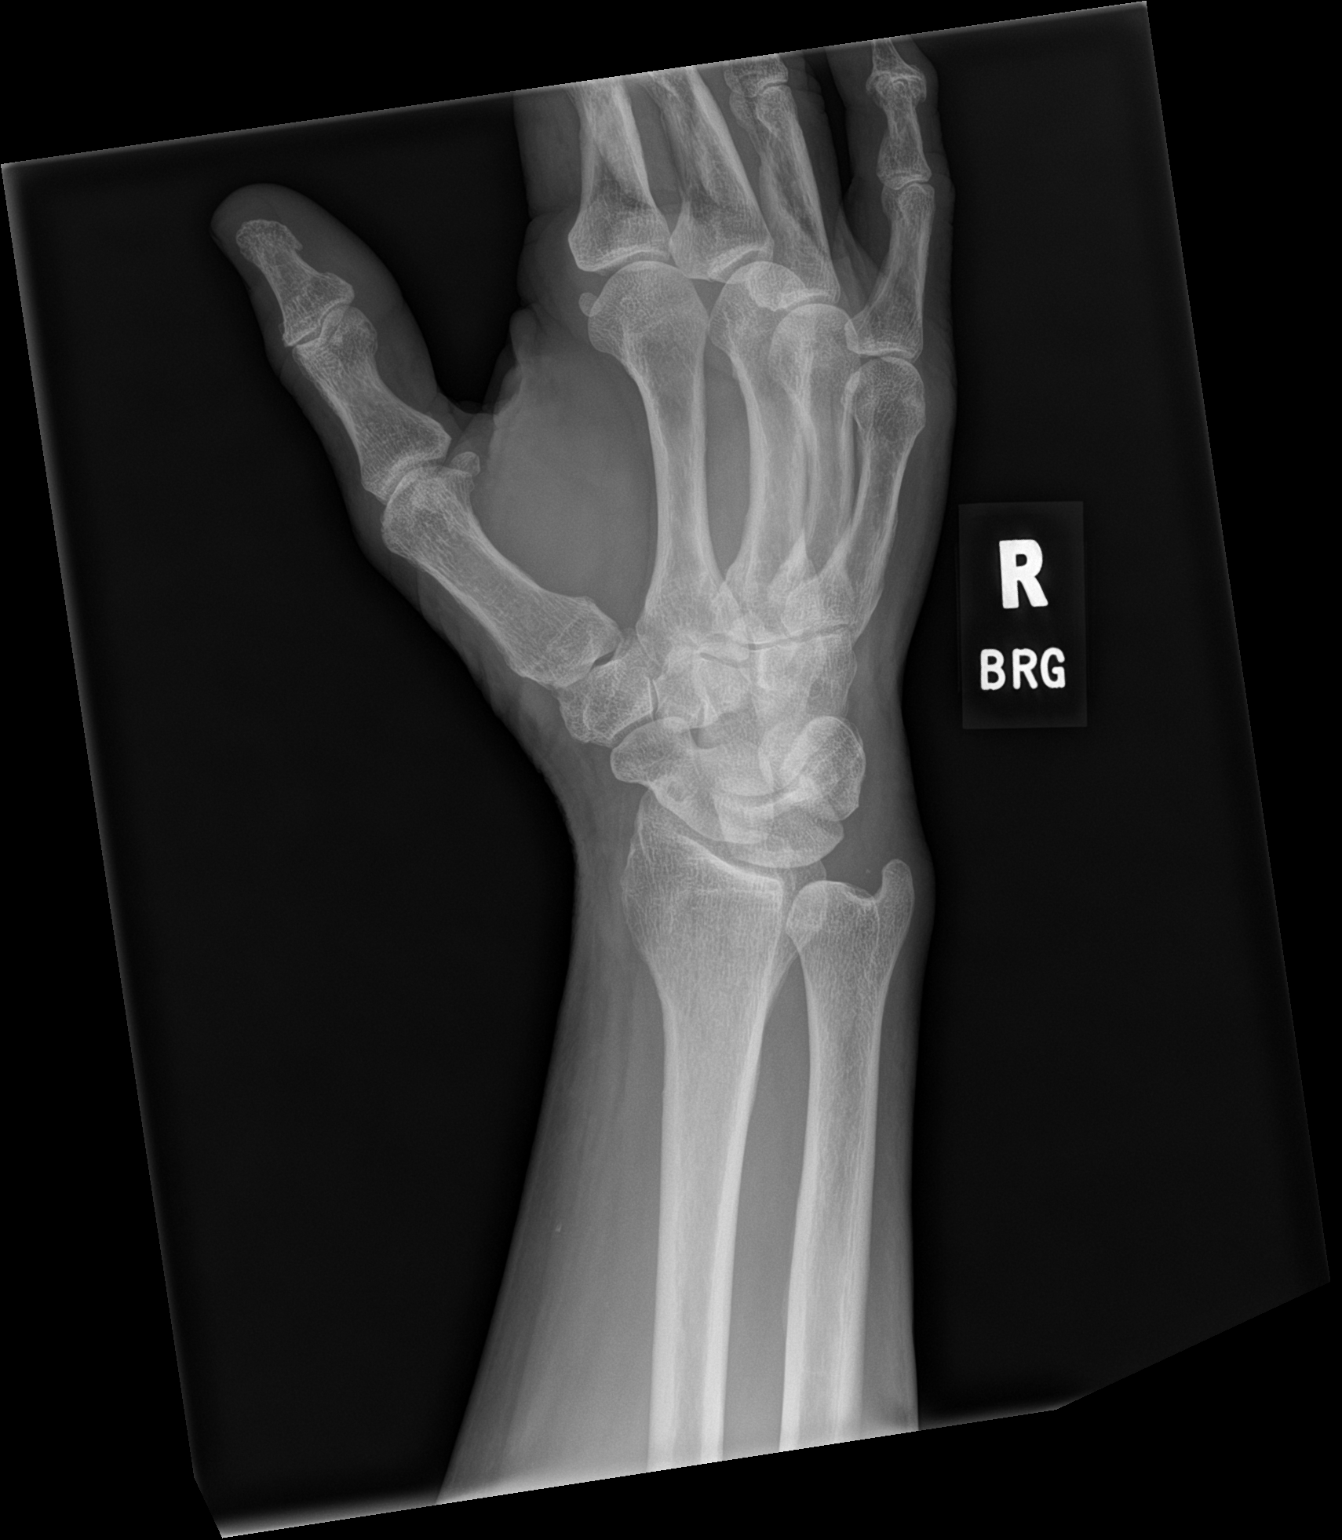

[wrist lat]
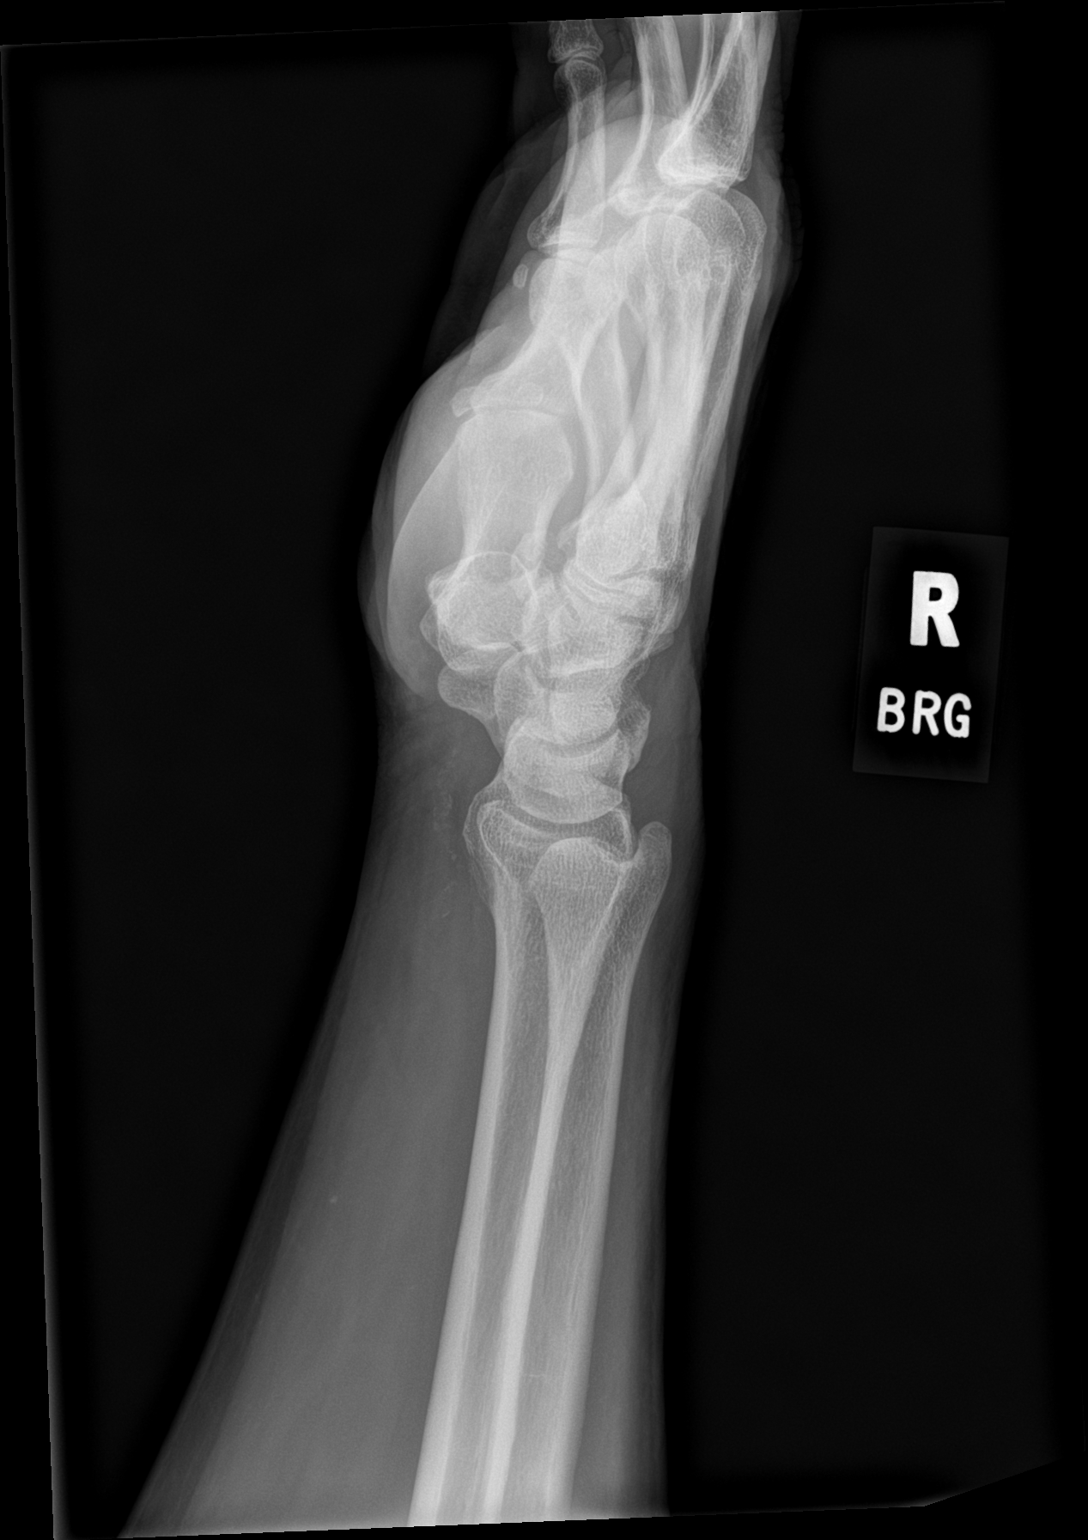

[3 of 3 positions shown; findings below may reference images not displayed]

FINDINGS: Frontal, oblique, and lateral views were obtained. There is no
fracture or dislocation. The joint spaces appear normal. There is a
4 mm cyst in the mid scaphoid, a benign finding. Trace calcification
noted in the triangular fibrocartilage.
IMPRESSION: No acute fracture or dislocation. No appreciable joint space
narrowing. Trace calcification in the triangular fibrocartilage may
represent mild arthropathy or residua of old trauma.

## 2022-09-24 DIAGNOSIS — J209 Acute bronchitis, unspecified: Secondary | ICD-10-CM | POA: Diagnosis not present

## 2022-09-24 DIAGNOSIS — J45901 Unspecified asthma with (acute) exacerbation: Secondary | ICD-10-CM | POA: Diagnosis not present

## 2022-09-29 ENCOUNTER — Ambulatory Visit (INDEPENDENT_AMBULATORY_CARE_PROVIDER_SITE_OTHER): Payer: Medicare Other | Admitting: Internal Medicine

## 2022-09-29 ENCOUNTER — Encounter: Payer: Self-pay | Admitting: Internal Medicine

## 2022-09-29 VITALS — BP 130/70 | HR 67 | Ht 69.0 in | Wt 228.8 lb

## 2022-09-29 DIAGNOSIS — I10 Essential (primary) hypertension: Secondary | ICD-10-CM | POA: Diagnosis not present

## 2022-09-29 DIAGNOSIS — I2510 Atherosclerotic heart disease of native coronary artery without angina pectoris: Secondary | ICD-10-CM | POA: Diagnosis not present

## 2022-09-29 DIAGNOSIS — E785 Hyperlipidemia, unspecified: Secondary | ICD-10-CM | POA: Diagnosis not present

## 2022-09-29 DIAGNOSIS — E782 Mixed hyperlipidemia: Secondary | ICD-10-CM | POA: Diagnosis not present

## 2022-09-29 DIAGNOSIS — Z951 Presence of aortocoronary bypass graft: Secondary | ICD-10-CM

## 2022-09-29 DIAGNOSIS — Z794 Long term (current) use of insulin: Secondary | ICD-10-CM

## 2022-09-29 DIAGNOSIS — K7581 Nonalcoholic steatohepatitis (NASH): Secondary | ICD-10-CM | POA: Diagnosis not present

## 2022-09-29 DIAGNOSIS — G4733 Obstructive sleep apnea (adult) (pediatric): Secondary | ICD-10-CM | POA: Diagnosis not present

## 2022-09-29 DIAGNOSIS — I639 Cerebral infarction, unspecified: Secondary | ICD-10-CM

## 2022-09-29 DIAGNOSIS — E119 Type 2 diabetes mellitus without complications: Secondary | ICD-10-CM | POA: Diagnosis not present

## 2022-09-29 DIAGNOSIS — E1100 Type 2 diabetes mellitus with hyperosmolarity without nonketotic hyperglycemic-hyperosmolar coma (NKHHC): Secondary | ICD-10-CM | POA: Diagnosis not present

## 2022-09-29 DIAGNOSIS — Z23 Encounter for immunization: Secondary | ICD-10-CM | POA: Diagnosis not present

## 2022-09-29 DIAGNOSIS — K7460 Unspecified cirrhosis of liver: Secondary | ICD-10-CM

## 2022-09-29 DIAGNOSIS — Z139 Encounter for screening, unspecified: Secondary | ICD-10-CM | POA: Diagnosis not present

## 2022-09-29 DIAGNOSIS — Z0001 Encounter for general adult medical examination with abnormal findings: Secondary | ICD-10-CM | POA: Diagnosis not present

## 2022-09-29 DIAGNOSIS — R29818 Other symptoms and signs involving the nervous system: Secondary | ICD-10-CM

## 2022-09-29 MED ORDER — FREESTYLE LIBRE 2 READER DEVI: 1.0000 | 0 refills | Status: DC

## 2022-09-29 MED ORDER — FREESTYLE LIBRE 2 SENSOR MISC: 1.0000 | 0 refills | Status: DC

## 2022-09-29 NOTE — Assessment & Plan Note (Signed)
Previously documented history of OSA.  He underwent a split-night study and PAP titration study in 2020, which showed severe OSA.  BiPAP was recommended, but he has not received the machine and the report recommends BiPAP titration study.  Given his previous issues with obtaining his BiPAP machine for treatment of OSA, I have placed a referral to pulmonology today.

## 2022-09-29 NOTE — Assessment & Plan Note (Signed)
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  Baseline labs been ordered today.

## 2022-09-29 NOTE — Progress Notes (Signed)
New Patient Office Visit  Subjective    Patient ID: [name] [name], male    DOB: 1956/05/25  Age: 66 y.o. MRN: [id number]  CC:  Chief Complaint  Patient presents with   Establish Care   HPI [name] [name] presents to establish care. He is a 66 year-old male with an extensive past medical history most notable for CAD s/p CABG x3 (2017), T2DM, prior CVA (2021), HTN, OSA (not on CPAP), and cirrhosis 2/2 NASH.  His previous PCP was in [place].  He is currently followed by multiple specialists within the [facility] network. [name] [name] endorses sinus congestion today but otherwise states that he feels well.  He has no acute concerns to discuss.  Chronic medical conditions and outstanding preventative care items discussed today are individually addressed in A/P below.  Outpatient Encounter Medications as of 09/29/2022  Medication Sig   aspirin EC 81 MG tablet Take 81 mg by mouth daily. Swallow whole.   carvedilol (COREG) 3.125 MG tablet TAKE 1 TABLET BY MOUTH TWICE DAILY   citalopram (CELEXA) 20 MG tablet Take 20 mg by mouth at bedtime.    Continuous Blood Gluc Receiver (FREESTYLE LIBRE 2 READER) DEVI 1 Device by Does not apply route continuous.   Continuous Blood Gluc Sensor (FREESTYLE LIBRE 2 SENSOR) MISC 1 each by Does not apply route continuous.   cyclobenzaprine (FLEXERIL) 10 MG tablet Take 1 tablet (10 mg total) by mouth 3 (three) times daily as needed for muscle spasms.   ezetimibe (ZETIA) 10 MG tablet Take 10 mg by mouth daily.   fluticasone (FLONASE) 50 MCG/ACT nasal spray Place 2 sprays into the nose 2 (two) times daily.   Fluticasone-Salmeterol (ADVAIR) 250-50 MCG/DOSE AEPB Inhale 1 puff into the lungs every 12 (twelve) hours.   gabapentin (NEURONTIN) 300 MG capsule Take 300 mg by mouth 3 (three) times daily.   HUMALOG KWIKPEN 100 UNIT/ML KwikPen Inject 5-30 Units into the skin 3 (three) times daily as needed (blood sugar of 200 or higher). Sliding scale   isosorbide mononitrate  (IMDUR) 60 MG 24 hr tablet Take one (1) tablet by mouth ( 60 mg ) in the am and one half tablet ( 30 mg) in the pm.   Melatonin 10 MG TABS Take 10 mg by mouth at bedtime.   metFORMIN (GLUCOPHAGE) 1000 MG tablet Take 1 tablet (1,000 mg total) by mouth 2 (two) times daily. For the next 2 days as you did receive IV contrast, then resume on 11/26/2018 (Patient taking differently: Take 1,000 mg by mouth 2 (two) times daily.)   nitroGLYCERIN (NITROSTAT) 0.4 MG SL tablet Place 0.4 mg under the tongue every 5 (five) minutes x 3 doses as needed for chest pain (if no relief after 2nd dose, proceed to the ED for an evaluation or call 911).   omeprazole (PRILOSEC) 40 MG capsule Take 40 mg by mouth daily.   TRESIBA FLEXTOUCH 100 UNIT/ML FlexTouch Pen Inject 65 Units into the skin in the morning and at bedtime. Inject 60 units in the AM and 70 units in the PM. (Patient taking differently: Inject 60-70 Units into the skin See admin instructions. Inject 60 units in the AM and 70 units in the PM.)   zonisamide (ZONEGRAN) 100 MG capsule Take 100 mg by mouth at bedtime.   Facility-Administered Encounter Medications as of 09/29/2022  Medication   sodium chloride flush (NS) 0.9 % injection 3 mL    Past Medical History:  Diagnosis Date   Anxiety  Arthritis    Asthma    Cataract    Right eye   Chronic lower back pain    Cirrhosis of liver (HCC)    Coronary atherosclerosis of native coronary artery    a. s/p multiple prior stents, s/p CABG in 01/2016 with LIMA-LAD, Free RIMA-OM2 and SVG-PDA b. 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   LIMA to LAD, free RIMA to OM2, SVG to PDA, open SVG  harvest from right thigh   Stroke (HCC)    Thrombocytopenia (HCC)    TIA (transient ischemic attack)    Type 2 diabetes mellitus ([place])     Past Surgical History:  Procedure Laterality Date   ANTERIOR CERVICAL DECOMP/DISCECTOMY FUSION  1998   "C3-4"   CARDIAC CATHETERIZATION  "several"   CARDIAC CATHETERIZATION N/A 12/30/2015   Procedure: Left Heart Cath and Coronary Angiography;  Surgeon: [name], MD;  Location: [place] [facility];  Service: Cardiovascular;  Laterality: N/A;   CARPAL TUNNEL RELEASE Bilateral 2005   CHOLECYSTECTOMY N/A 11/28/2018   Procedure: LAPAROSCOPIC CHOLECYSTECTOMY;  Surgeon: [name], MD;  Location: [facility];  Service: General;  Laterality: N/A;   COLONOSCOPY     COLONOSCOPY WITH PROPOFOL N/A 09/27/2020   Procedure: COLONOSCOPY WITH PROPOFOL;  Surgeon: [name], MD;  Location: [facility];  Service: Gastroenterology;  Laterality: N/A;   CORONARY ANGIOPLASTY     CORONARY ANGIOPLASTY WITH STENT PLACEMENT  2002; 2003; 11/20/2014   "I have 4 stents after today" (11/20/2014)   CORONARY ARTERY BYPASS GRAFT N/A 01/09/2016   Procedure: CORONARY ARTERY BYPASS GRAFTING (CABG) X 3 UTILIZING RIGHT AND LEFT INTERNAL MAMMARY ARTERY AND ENDOSCOPICALLY HARVESTED SAPHENEOUS VEIN.;  Surgeon: Rexene Alberts, MD;  Location: [place];  Service: Open Heart Surgery;  Laterality: N/A;   ESOPHAGOGASTRODUODENOSCOPY     ESOPHAGOGASTRODUODENOSCOPY (EGD) WITH PROPOFOL N/A 09/27/2020   Procedure: ESOPHAGOGASTRODUODENOSCOPY (EGD) WITH PROPOFOL;  Surgeon: [name], MD;  [facility];  Service: Gastroenterology;  Laterality: N/A;  10   KNEE SURGERY Left 02/2012   "scraped; open"   LEFT HEART CATH AND CORS/GRAFTS ANGIOGRAPHY N/A 08/09/2017   Procedure: LEFT HEART CATH AND CORS/GRAFTS ANGIOGRAPHY;  Surgeon: [name], MD;  Location: [place] [facility];  Service: Cardiovascular;  Laterality: N/A;   LEFT HEART CATH AND CORS/GRAFTS  ANGIOGRAPHY N/A 11/22/2018   Procedure: LEFT HEART CATH AND CORS/GRAFTS ANGIOGRAPHY;  Surgeon: [name], [name], MD;  Location: [place] [facility];  Service: Cardiovascular;  Laterality: N/A;   LEFT HEART CATH AND CORS/GRAFTS ANGIOGRAPHY N/A 08/20/2022   Procedure: LEFT HEART CATH AND CORS/GRAFTS ANGIOGRAPHY;  Surgeon: Burnell Blanks, MD;  Location: [place] [facility];  Service: Cardiovascular;  Laterality: N/A;   LEFT HEART CATHETERIZATION WITH CORONARY ANGIOGRAM N/A 07/20/2012   Procedure: LEFT HEART CATHETERIZATION WITH CORONARY ANGIOGRAM;  Surgeon: [name], MD;  Location: [place] [facility];  Service: Cardiovascular;  Laterality: N/A;   LEFT HEART CATHETERIZATION WITH CORONARY ANGIOGRAM N/A 11/20/2014   Procedure: LEFT HEART CATHETERIZATION WITH CORONARY ANGIOGRAM;  Surgeon:  [name], MD;  Location: [facility] CATH LAB;  Service: Cardiovascular;  Laterality: N/A;   LEFT HEART CATHETERIZATION WITH CORONARY ANGIOGRAM N/A 11/26/2014   Procedure: LEFT HEART CATHETERIZATION WITH CORONARY ANGIOGRAM;  Surgeon: [name], MD;  Location: [facility] CATH LAB;  Service: Cardiovascular;  Laterality: N/A;   NEUROPLASTY / TRANSPOSITION ULNAR NERVE AT ELBOW Right ~ 2012   PERCUTANEOUS CORONARY ROTOBLATOR INTERVENTION (PCI-R)  11/20/2014   Procedure: PERCUTANEOUS CORONARY ROTOBLATOR INTERVENTION (PCI-R);  Surgeon: [name], MD;  Location: [facility] CATH LAB;  Service: Cardiovascular;;   POSTERIOR CERVICAL LAMINECTOMY Left 04/16/2022   Procedure: Laminectomy and Foraminotomy - left - C6-C7;  Surgeon: Earnie Larsson, MD;  Location: [place];  Service: Neurosurgery;  Laterality: Left;  3C   SHOULDER ARTHROSCOPY Left ~ 2011   TEE WITHOUT CARDIOVERSION N/A 01/09/2016   Procedure: TRANSESOPHAGEAL ECHOCARDIOGRAM (TEE);  Surgeon: Rexene Alberts, MD;  Location: [place];  Service: Open Heart Surgery;  Laterality: N/A;    Family History  Problem Relation Age of Onset   Stroke Mother    Coronary artery disease Father 41    Asthma Sister    Multiple sclerosis Brother    Heart disease Paternal Uncle    Stroke Maternal Grandmother    Heart attack Paternal Grandmother    Cancer Paternal Grandfather    Asthma Sister    Seizures Son    Migraines Son    Autism Son    Migraines Son     Social History   Socioeconomic History   Marital status: Married    Spouse name: [name]   Number of children: 3   Years of education: 61 th    Highest education level: Not on file  Occupational History   Occupation: Unemployed  Tobacco Use   Smoking status: Never   Smokeless tobacco: Never  Vaping Use   Vaping Use: Never used  Substance and Sexual Activity   Alcohol use: No   Drug use: No   Sexual activity: Not on file  Other Topics Concern   Not on file  Social History Narrative   Patient lives at home with wife [name].   Patient works at [employer], Engineer, petroleum.   Patient has a 12 th grade education.    Patient has 3 children.       Social Determinants of Health   Financial Resource Strain: Low Risk  (02/25/2021)   Overall Financial Resource Strain (CARDIA)    Difficulty of Paying Living Expenses: Not hard at all  Food Insecurity: No Food Insecurity (02/25/2021)   Hunger Vital Sign    Worried About Running Out of Food in the Last Year: Never true    Ran Out of Food in the Last Year: Never true  Transportation Needs: No Transportation Needs (02/25/2021)   PRAPARE - Hydrologist (Medical): No    Lack of Transportation (Non-Medical): No  Physical Activity: Sufficiently Active (02/25/2021)   Exercise Vital Sign    Days of Exercise per Week: 6 days    Minutes of Exercise per Session: 60 min  Stress: No Stress Concern Present (02/25/2021)   [place]    Feeling of Stress : Not at all  Social Connections: [place] (02/25/2021)   Social Connection and Isolation Panel [NHANES]    Frequency  of Communication with Friends and Family: More than three times a week    Frequency of Social Gatherings with Friends and Family:  More than three times a week    Attends Religious Services: More than 4 times per year    Active Member of Clubs or Organizations: Yes    Attends Archivist Meetings: More than 4 times per year    Marital Status: Married  Human resources officer Violence: Not on file   Review of Systems  HENT:  Positive for congestion.   All other systems reviewed and are negative.  Objective    BP 130/70   Pulse 67   Ht 5' 9"  (1.753 m)   Wt 228 lb 12.8 oz (103.8 kg)   SpO2 95%   BMI 33.79 kg/m   Physical Exam Vitals reviewed.  Constitutional:      General: He is not in acute distress.    Appearance: Normal appearance. He is obese. He is not ill-appearing.  HENT:     Head: Normocephalic and atraumatic.     Nose: Nose normal. No congestion or rhinorrhea.     Mouth/Throat:     Mouth: Mucous membranes are moist.     Pharynx: Oropharynx is clear.  Eyes:     Extraocular Movements: Extraocular movements intact.     Conjunctiva/sclera: Conjunctivae normal.     Pupils: Pupils are equal, round, and reactive to light.  Cardiovascular:     Rate and Rhythm: Normal rate and regular rhythm.     Pulses: Normal pulses.     Heart sounds: Murmur heard.  Pulmonary:     Effort: Pulmonary effort is normal.     Breath sounds: Normal breath sounds. No wheezing, rhonchi or rales.  Abdominal:     General: Abdomen is flat. Bowel sounds are normal. There is no distension.     Palpations: Abdomen is soft.     Tenderness: There is no abdominal tenderness.  Musculoskeletal:        General: No swelling or deformity. Normal range of motion.     Cervical back: Normal range of motion.  Skin:    General: Skin is warm and dry.     Capillary Refill: Capillary refill takes less than 2 seconds.  Neurological:     General: No focal deficit present.     Mental Status: He is alert and  oriented to person, place, and time.     Motor: No weakness.  Psychiatric:        Mood and Affect: Mood normal.        Behavior: Behavior normal.        Thought Content: Thought content normal.    Assessment & Plan:   Problem List Items Addressed This Visit       Essential hypertension, benign    His blood pressure today is 130/70.  He is currently prescribed carvedilol 3.125 mg twice daily and Imdur 60 mg every morning and 30 mg every afternoon.  No medication changes today.      CAD- diffuse LAD disease at cath 12/30/15    Extensive history of CAD s/p multiple PCI's and CABG x3 in 2017.  His current cardiologist is Dr. Domenic Polite.  He underwent LHC in October, which showed severe triple-vessel CAD with patent grafts.  Continue medical management was recommended.  He is currently prescribed ASA and carvedilol as well as Zetia.  No medication changes today      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) [facility])    He reports a history of prior CVA in 2021.  He has no residual deficits.  He is currently prescribed ASA and  ezetimibe.  No medication changes today.      Obstructive sleep apnea-declines C-pap    Previously documented history of OSA.  He underwent a split-night study and PAP titration study in 2020, which showed severe OSA.  BiPAP was recommended, but he has not received the machine and the report recommends BiPAP titration study.  Given his previous issues with obtaining his BiPAP machine for treatment of OSA, I have placed a referral to pulmonology today.      Liver cirrhosis secondary to NASH [facility])    Previously followed by GI for NASH cirrhosis.  Baseline labs been ordered today.      Type 2 diabetes mellitus ([place]) - Primary    His most recent A1c was 8.2.  He is currently prescribed metformin 1000 mg twice daily, Tresiba 60 units every morning and 70 units nightly, as well as Humalog sliding scale with meals.  He reports lability in blood  sugar readings recently, ranging 70-300s.  He is interested in a CGM for closer monitoring. -Freestyle libre ordered today -No medication changes today.  We will obtain baseline labs and plan for follow-up in 4 weeks to review his diabetes regimen.  Consider initiating GLP-1 or SGLT2 therapy at that time.      Mixed hyperlipidemia    Currently prescribed Zetia 10 mg daily.  He endorses previous statin intolerance due to lower extremity cramping.  Repeat lipid panel ordered today.      Encounter for general adult medical examination with abnormal findings    Presenting today to establish care.  Previous records and labs were reviewed. -Baseline labs ordered today -Influenza vaccine administered today -I recommended that he receive zoster vaccination at his pharmacy -Follow-up in 4 weeks and address remaining outstanding preventative care items at that time -Referral placed to podiatry today at his request for diabetic foot care      Return in about 4 weeks (around 10/27/2022).   [name], MD

## 2022-09-29 NOTE — Assessment & Plan Note (Signed)
Extensive history of CAD s/p multiple PCI's and CABG x3 in 2017.  His current cardiologist is Dr. Domenic Polite.  He underwent LHC in October, which showed severe triple-vessel CAD with patent grafts.  Continue medical management was recommended.  He is currently prescribed ASA and carvedilol as well as Zetia.  No medication changes today

## 2022-09-29 NOTE — Assessment & Plan Note (Signed)
He reports a history of prior CVA in 2021.  He has no residual deficits.  He is currently prescribed ASA and ezetimibe.  No medication changes today.

## 2022-09-29 NOTE — Assessment & Plan Note (Signed)
His blood pressure today is 130/70.  He is currently prescribed carvedilol 3.125 mg twice daily and Imdur 60 mg every morning and 30 mg every afternoon.  No medication changes today.

## 2022-09-29 NOTE — Assessment & Plan Note (Signed)
Currently prescribed Zetia 10 mg daily.  He endorses previous statin intolerance due to lower extremity cramping.  Repeat lipid panel ordered today.

## 2022-09-29 NOTE — Assessment & Plan Note (Signed)
His most recent A1c was 8.2.  He is currently prescribed metformin 1000 mg twice daily, Tresiba 60 units every morning and 70 units nightly, as well as Humalog sliding scale with meals.  He reports lability in blood sugar readings recently, ranging 70-300s.  He is interested in a CGM for closer monitoring. -Freestyle libre ordered today -No medication changes today.  We will obtain baseline labs and plan for follow-up in 4 weeks to review his diabetes regimen.  Consider initiating GLP-1 or SGLT2 therapy at that time.

## 2022-09-29 NOTE — Assessment & Plan Note (Signed)
Presenting today to establish care.  Previous records and labs were reviewed. -Baseline labs ordered today -Influenza vaccine administered today -I recommended that he receive zoster vaccination at his pharmacy -Follow-up in 4 weeks and address remaining outstanding preventative care items at that time -Referral placed to podiatry today at his request for diabetic foot care

## 2022-09-29 NOTE — Patient Instructions (Signed)
It was a pleasure to see you today.  Thank you for giving [place] the opportunity to be involved in your care.  Below is a brief recap of your visit and next steps.  We will plan to see you again in 4 weeks.  Summary You have established care today We will check labs and you will receive your flu shot We will plan for follow up in 4 weeks to review labs and discuss diabetes

## 2022-10-01 LAB — CBC WITH DIFFERENTIAL/PLATELET
Basophils Absolute: 0 10*3/uL (ref 0.0–0.2)
Basos: 1 %
EOS (ABSOLUTE): 0.1 10*3/uL (ref 0.0–0.4)
Eos: 2 %
Hematocrit: 43.6 % (ref 37.5–51.0)
Hemoglobin: 14.9 g/dL (ref 13.0–17.7)
Immature Grans (Abs): 0 10*3/uL (ref 0.0–0.1)
Immature Granulocytes: 1 %
Lymphocytes Absolute: 1 10*3/uL (ref 0.7–3.1)
Lymphs: 28 %
MCH: 32.3 pg (ref 26.6–33.0)
MCHC: 34.2 g/dL (ref 31.5–35.7)
MCV: 94 fL (ref 79–97)
Monocytes Absolute: 0.2 10*3/uL (ref 0.1–0.9)
Monocytes: 6 %
Neutrophils Absolute: 2.3 10*3/uL (ref 1.4–7.0)
Neutrophils: 62 %
Platelets: 99 10*3/uL — CL (ref 150–450)
RBC: 4.62 x10E6/uL (ref 4.14–5.80)
RDW: 13.6 % (ref 11.6–15.4)
WBC: 3.7 10*3/uL (ref 3.4–10.8)

## 2022-10-01 LAB — CMP14+EGFR
ALT: 29 IU/L (ref 0–44)
AST: 37 IU/L (ref 0–40)
Albumin/Globulin Ratio: 1.1 — ABNORMAL LOW (ref 1.2–2.2)
Albumin: 3.7 g/dL — ABNORMAL LOW (ref 3.9–4.9)
Alkaline Phosphatase: 63 IU/L (ref 44–121)
BUN/Creatinine Ratio: 16 (ref 10–24)
BUN: 16 mg/dL (ref 8–27)
Bilirubin Total: 0.4 mg/dL (ref 0.0–1.2)
CO2: 23 mmol/L (ref 20–29)
Calcium: 9.2 mg/dL (ref 8.6–10.2)
Chloride: 105 mmol/L (ref 96–106)
Creatinine, Ser: 0.97 mg/dL (ref 0.76–1.27)
Globulin, Total: 3.5 g/dL (ref 1.5–4.5)
Glucose: 176 mg/dL — ABNORMAL HIGH (ref 70–99)
Potassium: 4.2 mmol/L (ref 3.5–5.2)
Sodium: 140 mmol/L (ref 134–144)
Total Protein: 7.2 g/dL (ref 6.0–8.5)
eGFR: 87 mL/min/{1.73_m2} (ref >59)

## 2022-10-01 LAB — LIPID PANEL
Chol/HDL Ratio: 3.4 ratio (ref 0.0–5.0)
Cholesterol, Total: 108 mg/dL (ref 100–199)
HDL: 32 mg/dL — ABNORMAL LOW (ref >39)
LDL Chol Calc (NIH): 55 mg/dL (ref 0–99)
Triglycerides: 112 mg/dL (ref 0–149)
VLDL Cholesterol Cal: 21 mg/dL (ref 5–40)

## 2022-10-01 LAB — B12 AND FOLATE PANEL
Folate: 12.7 ng/mL (ref >3.0)
Vitamin B-12: 738 pg/mL (ref 232–1245)

## 2022-10-01 LAB — TSH+FREE T4
Free T4: 1.26 ng/dL (ref 0.82–1.77)
TSH: 1.59 u[IU]/mL (ref 0.450–4.500)

## 2022-10-01 LAB — HEMOGLOBIN A1C
Est. average glucose Bld gHb Est-mCnc: 177 mg/dL
Hgb A1c MFr Bld: 7.8 % — ABNORMAL HIGH (ref 4.8–5.6)

## 2022-10-01 LAB — VITAMIN D 25 HYDROXY (VIT D DEFICIENCY, FRACTURES): Vit D, 25-Hydroxy: 25.8 ng/mL — ABNORMAL LOW (ref 30.0–100.0)

## 2022-10-13 ENCOUNTER — Other Ambulatory Visit: Payer: Self-pay

## 2022-10-13 DIAGNOSIS — Z794 Long term (current) use of insulin: Secondary | ICD-10-CM

## 2022-10-13 MED ORDER — FLUTICASONE PROPIONATE 50 MCG/ACT NA SUSP: 2.0000 | Freq: Two times a day (BID) | NASAL | 3 refills | Status: DC

## 2022-10-13 MED ORDER — GABAPENTIN 300 MG PO CAPS: 300.0000 mg | ORAL_CAPSULE | Freq: Three times a day (TID) | ORAL | 0 refills | Status: DC

## 2022-10-13 MED ORDER — CARVEDILOL 3.125 MG PO TABS: 3.1250 mg | ORAL_TABLET | Freq: Two times a day (BID) | ORAL | 3 refills | Status: DC

## 2022-10-13 MED ORDER — FREESTYLE LIBRE 2 SENSOR MISC: 1.0000 | 0 refills | Status: DC

## 2022-10-13 MED ORDER — HUMALOG KWIKPEN 100 UNIT/ML ~~LOC~~ SOPN: 5.0000 [IU] | PEN_INJECTOR | Freq: Three times a day (TID) | SUBCUTANEOUS | 3 refills | Status: DC | PRN

## 2022-10-13 MED ORDER — FREESTYLE LIBRE 2 READER DEVI: 1.0000 | 0 refills | Status: DC

## 2022-10-13 MED ORDER — TRESIBA FLEXTOUCH 100 UNIT/ML ~~LOC~~ SOPN: 65.0000 [IU] | PEN_INJECTOR | Freq: Two times a day (BID) | SUBCUTANEOUS | Status: DC

## 2022-10-13 MED ORDER — OMEPRAZOLE 40 MG PO CPDR: 40.0000 mg | DELAYED_RELEASE_CAPSULE | Freq: Every day | ORAL | 3 refills | Status: DC

## 2022-10-13 MED ORDER — METFORMIN HCL 1000 MG PO TABS: 1000.0000 mg | ORAL_TABLET | Freq: Two times a day (BID) | ORAL | 11 refills | Status: DC

## 2022-10-13 MED ORDER — EZETIMIBE 10 MG PO TABS: 10.0000 mg | ORAL_TABLET | Freq: Every day | ORAL | 0 refills | Status: DC

## 2022-10-13 MED ORDER — NITROGLYCERIN 0.4 MG SL SUBL: 0.4000 mg | SUBLINGUAL_TABLET | SUBLINGUAL | 0 refills | Status: DC | PRN

## 2022-10-13 MED ORDER — ISOSORBIDE MONONITRATE ER 60 MG PO TB24: ORAL_TABLET | ORAL | 1 refills | Status: DC

## 2022-10-13 MED ORDER — CYCLOBENZAPRINE HCL 10 MG PO TABS: 10.0000 mg | ORAL_TABLET | Freq: Three times a day (TID) | ORAL | 0 refills | Status: DC | PRN

## 2022-10-13 MED ORDER — CITALOPRAM HYDROBROMIDE 20 MG PO TABS: 20.0000 mg | ORAL_TABLET | Freq: Every day | ORAL | 0 refills | Status: DC

## 2022-10-21 ENCOUNTER — Telehealth: Payer: Self-pay | Admitting: Internal Medicine

## 2022-10-21 ENCOUNTER — Other Ambulatory Visit: Payer: Self-pay

## 2022-10-21 DIAGNOSIS — E1100 Type 2 diabetes mellitus with hyperosmolarity without nonketotic hyperglycemic-hyperosmolar coma (NKHHC): Secondary | ICD-10-CM

## 2022-10-21 MED ORDER — FUROSEMIDE 20 MG PO TABS: 40.0000 mg | ORAL_TABLET | Freq: Every day | ORAL | 3 refills | Status: DC

## 2022-10-21 MED ORDER — GABAPENTIN 300 MG PO CAPS: 300.0000 mg | ORAL_CAPSULE | Freq: Three times a day (TID) | ORAL | 0 refills | Status: DC

## 2022-10-21 MED ORDER — CARVEDILOL 3.125 MG PO TABS: 3.1250 mg | ORAL_TABLET | Freq: Two times a day (BID) | ORAL | 3 refills | Status: DC

## 2022-10-21 MED ORDER — TRESIBA FLEXTOUCH 100 UNIT/ML ~~LOC~~ SOPN: 65.0000 [IU] | PEN_INJECTOR | Freq: Two times a day (BID) | SUBCUTANEOUS | Status: DC

## 2022-10-21 MED ORDER — HUMALOG KWIKPEN 100 UNIT/ML ~~LOC~~ SOPN: 5.0000 [IU] | PEN_INJECTOR | Freq: Three times a day (TID) | SUBCUTANEOUS | 3 refills | Status: DC | PRN

## 2022-10-21 MED ORDER — EZETIMIBE 10 MG PO TABS: 10.0000 mg | ORAL_TABLET | Freq: Every day | ORAL | 0 refills | Status: DC

## 2022-10-21 MED ORDER — FREESTYLE LIBRE 2 READER DEVI: 1.0000 | 0 refills | Status: DC

## 2022-10-21 MED ORDER — METFORMIN HCL 1000 MG PO TABS: 1000.0000 mg | ORAL_TABLET | Freq: Two times a day (BID) | ORAL | 11 refills | Status: DC

## 2022-10-21 MED ORDER — CITALOPRAM HYDROBROMIDE 20 MG PO TABS: 20.0000 mg | ORAL_TABLET | Freq: Every day | ORAL | 0 refills | Status: DC

## 2022-10-21 MED ORDER — OMEPRAZOLE 40 MG PO CPDR: 40.0000 mg | DELAYED_RELEASE_CAPSULE | Freq: Every day | ORAL | 3 refills | Status: DC

## 2022-10-21 MED ORDER — NITROGLYCERIN 0.4 MG SL SUBL: 0.4000 mg | SUBLINGUAL_TABLET | SUBLINGUAL | 0 refills | Status: DC | PRN

## 2022-10-21 MED ORDER — FLUTICASONE PROPIONATE 50 MCG/ACT NA SUSP: 2.0000 | Freq: Two times a day (BID) | NASAL | 3 refills | Status: DC

## 2022-10-21 MED ORDER — ISOSORBIDE MONONITRATE ER 60 MG PO TB24: ORAL_TABLET | ORAL | 1 refills | Status: DC

## 2022-10-21 MED ORDER — PRAVASTATIN SODIUM 20 MG PO TABS: 20.0000 mg | ORAL_TABLET | Freq: Every day | ORAL | 0 refills | Status: DC

## 2022-10-21 MED ORDER — CYCLOBENZAPRINE HCL 10 MG PO TABS: 10.0000 mg | ORAL_TABLET | Freq: Three times a day (TID) | ORAL | 0 refills | Status: DC | PRN

## 2022-10-21 MED ORDER — FREESTYLE LIBRE 2 SENSOR MISC: 1.0000 | 0 refills | Status: DC

## 2022-10-21 NOTE — Telephone Encounter (Signed)
[facility] called in on patient behalf for refills on    PRAVASTATIAN 20MG   LAXIS 20MG  Could not find either on med list,

## 2022-10-22 ENCOUNTER — Other Ambulatory Visit: Payer: Self-pay | Admitting: Internal Medicine

## 2022-10-22 DIAGNOSIS — E1100 Type 2 diabetes mellitus with hyperosmolarity without nonketotic hyperglycemic-hyperosmolar coma (NKHHC): Secondary | ICD-10-CM

## 2022-10-28 ENCOUNTER — Encounter: Payer: Self-pay | Admitting: Cardiology

## 2022-10-28 ENCOUNTER — Ambulatory Visit: Payer: Medicare Other | Attending: Cardiology | Admitting: Cardiology

## 2022-10-28 VITALS — BP 124/70 | HR 68 | Ht 69.0 in | Wt 228.0 lb

## 2022-10-28 DIAGNOSIS — I10 Essential (primary) hypertension: Secondary | ICD-10-CM | POA: Diagnosis not present

## 2022-10-28 DIAGNOSIS — E782 Mixed hyperlipidemia: Secondary | ICD-10-CM | POA: Diagnosis not present

## 2022-10-28 DIAGNOSIS — I25119 Atherosclerotic heart disease of native coronary artery with unspecified angina pectoris: Secondary | ICD-10-CM

## 2022-10-28 NOTE — Progress Notes (Signed)
Cardiology Office Note  Date: 10/28/2022   ID: Karston, Hyland Jun 22, 1956, MRN [id number]  PCP:  [name], MD  Cardiologist:  Rozann Lesches, MD Electrophysiologist:  None   Chief Complaint  Patient presents with   Cardiac follow-up    History of Present Illness: [name] [name] is a 66 y.o. male last seen in October by Mr. Barbarann Ehlers NP, I reviewed the note.  He is here today for a routine visit.  Reports no angina or interval nitroglycerin use.  We discussed the results of his cardiac catheterization.  He states that he has been taking his medications regularly, no obvious intolerances.  Interval lab work from November showed excellent cholesterol control with LDL 55 on Pravachol and Zetia.  Past Medical History:  Diagnosis Date   Anxiety    Arthritis    Asthma    Cataract    Right eye   Chronic lower back pain    Cirrhosis of liver (HCC)    Coronary atherosclerosis of native coronary artery    a. s/p multiple prior stents, s/p CABG in 01/2016 with LIMA-LAD, Free RIMA-OM2 and SVG-PDA b. 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   LIMA to LAD, free RIMA to OM2, SVG to PDA, open SVG harvest from right thigh   Stroke ([place])    Thrombocytopenia (HCC)    TIA (transient ischemic attack)    Type 2 diabetes mellitus ([place])     Past Surgical History:  Procedure Laterality Date   ANTERIOR CERVICAL DECOMP/DISCECTOMY FUSION  1998   "C3-4"   CARDIAC CATHETERIZATION  "several"   CARDIAC CATHETERIZATION N/A 12/30/2015   Procedure: Left Heart Cath and Coronary Angiography;  Surgeon: [name], MD;  Location: [place] [facility];  Service:  Cardiovascular;  Laterality: N/A;   CARPAL TUNNEL RELEASE Bilateral 2005   CHOLECYSTECTOMY N/A 11/28/2018   Procedure: LAPAROSCOPIC CHOLECYSTECTOMY;  Surgeon: [name], MD;  [facility]: [facility];  Service: General;  Laterality: N/A;   COLONOSCOPY     COLONOSCOPY WITH PROPOFOL N/A 09/27/2020   Procedure: COLONOSCOPY WITH PROPOFOL;  Surgeon: [name], MD;  [facility];  Service: Gastroenterology;  Laterality: N/A;   CORONARY ANGIOPLASTY     CORONARY ANGIOPLASTY WITH STENT PLACEMENT  2002; 2003; 11/20/2014   "I have 4 stents after today" (11/20/2014)   CORONARY ARTERY BYPASS GRAFT N/A 01/09/2016   Procedure: CORONARY ARTERY BYPASS GRAFTING (CABG) X 3 UTILIZING RIGHT AND LEFT INTERNAL MAMMARY ARTERY AND ENDOSCOPICALLY HARVESTED SAPHENEOUS VEIN.;  Surgeon: Rexene Alberts, MD;  Location: [facility];  Service: Open Heart Surgery;  Laterality: N/A;   ESOPHAGOGASTRODUODENOSCOPY     ESOPHAGOGASTRODUODENOSCOPY (EGD) WITH PROPOFOL N/A 09/27/2020   Procedure: ESOPHAGOGASTRODUODENOSCOPY (EGD) WITH PROPOFOL;  Surgeon: [name], MD;  [facility];  Service: Gastroenterology;  Laterality: N/A;  10   KNEE SURGERY Left 02/2012   "scraped; open"   LEFT HEART CATH AND CORS/GRAFTS ANGIOGRAPHY N/A 08/09/2017   Procedure: LEFT HEART CATH AND CORS/GRAFTS ANGIOGRAPHY;  Surgeon: [name], MD;  Location:  [place];  Service: Cardiovascular;  Laterality: N/A;   LEFT HEART CATH AND CORS/GRAFTS ANGIOGRAPHY N/A 11/22/2018   Procedure: LEFT HEART CATH AND CORS/GRAFTS ANGIOGRAPHY;  Surgeon: [name], [name], MD;  Location: [facility];  Service: Cardiovascular;  Laterality: N/A;   LEFT HEART CATH AND CORS/GRAFTS ANGIOGRAPHY N/A 08/20/2022   Procedure: LEFT HEART CATH AND CORS/GRAFTS ANGIOGRAPHY;  Surgeon: Burnell Blanks, MD;  Location: [place] [facility];  Service: Cardiovascular;  Laterality: N/A;   LEFT HEART CATHETERIZATION WITH CORONARY  ANGIOGRAM N/A 07/20/2012   Procedure: LEFT HEART CATHETERIZATION WITH CORONARY ANGIOGRAM;  Surgeon: [name], MD;  Location: [facility] CATH LAB;  Service: Cardiovascular;  Laterality: N/A;   LEFT HEART CATHETERIZATION WITH CORONARY ANGIOGRAM N/A 11/20/2014   Procedure: LEFT HEART CATHETERIZATION WITH CORONARY ANGIOGRAM;  Surgeon: [name], MD;  Location: [facility] LAB;  Service: Cardiovascular;  Laterality: N/A;   LEFT HEART CATHETERIZATION WITH CORONARY ANGIOGRAM N/A 11/26/2014   Procedure: LEFT HEART CATHETERIZATION WITH CORONARY ANGIOGRAM;  Surgeon: [name], MD;  Location: [facility] C[facility] LAB;  Service: Cardiovascular;  Laterality: N/A;   NEUROPLASTY / TRANSPOSITION ULNAR NERVE AT ELBOW Right ~ 2012   PERCUTANEOUS CORONARY ROTOBLATOR INTERVENTION (PCI-R)  11/20/2014   Procedure: PERCUTANEOUS CORONARY ROTOBLATOR INTERVENTION (PCI-R);  Surgeon: [name], MD;  Location: [facility];  Service: Cardiovascular;;   POSTERIOR CERVICAL LAMINECTOMY Left 04/16/2022   Procedure: Laminectomy and Foraminotomy - left - C6-C7;  Surgeon: Earnie Larsson, MD;  Location: [place];  Service: Neurosurgery;  Laterality: Left;  3C   SHOULDER ARTHROSCOPY Left ~ 2011   TEE WITHOUT CARDIOVERSION N/A 01/09/2016   Procedure: TRANSESOPHAGEAL ECHOCARDIOGRAM (TEE);  Surgeon: Rexene Alberts, MD;  Location: [place];  Service: Open Heart Surgery;  Laterality: N/A;    Current Outpatient Medications  Medication Sig Dispense Refill   aspirin EC 81 MG tablet Take 81 mg by mouth daily. Swallow whole.     carvedilol (COREG) 3.125 MG tablet Take 1 tablet (3.125 mg total) by mouth 2 (two) times daily. 60 tablet 3   citalopram (CELEXA) 20 MG tablet Take 1 tablet (20 mg total) by mouth at bedtime. 90 tablet 0   Continuous Blood Gluc Receiver (FREESTYLE LIBRE 2 READER) DEVI 1 Device by Does not apply route continuous. 1 each 0   Continuous Blood Gluc Sensor (FREESTYLE LIBRE 2 SENSOR) MISC USE AS DIRECTED 2 each 0   cyclobenzaprine  (FLEXERIL) 10 MG tablet Take 1 tablet (10 mg total) by mouth 3 (three) times daily as needed for muscle spasms. 30 tablet 0   ezetimibe (ZETIA) 10 MG tablet Take 1 tablet (10 mg total) by mouth daily. 90 tablet 0   fluticasone (FLONASE) 50 MCG/ACT nasal spray Place 2 sprays into both nostrils 2 (two) times daily. 15.8 mL 3   Fluticasone-Salmeterol (ADVAIR) 250-50 MCG/DOSE AEPB Inhale 1 puff into the lungs every 12 (twelve) hours.     furosemide (LASIX) 20 MG tablet Take 2 tablets (40 mg total) by mouth daily. 30 tablet 3   gabapentin (NEURONTIN) 300 MG capsule Take 1 capsule (300 mg total) by mouth 3 (three) times daily. Take 300 mg by mouth 3 (three) times daily. 270 capsule 0   HUMALOG KWIKPEN 100 UNIT/ML KwikPen Inject 5-30 Units into the skin 3 (three) times daily as needed (blood sugar of 200 or higher). Sliding scale 15 mL 3   isosorbide mononitrate (IMDUR) 60 MG 24 hr tablet Take one (1) tablet by mouth (  60 mg ) in the am and one half tablet ( 30 mg) in the pm. 90 tablet 1   Melatonin 10 MG TABS Take 10 mg by mouth at bedtime.     metFORMIN (GLUCOPHAGE) 1000 MG tablet Take 1 tablet (1,000 mg total) by mouth 2 (two) times daily. 60 tablet 11   nitroGLYCERIN (NITROSTAT) 0.4 MG SL tablet Place 1 tablet (0.4 mg total) under the tongue every 5 (five) minutes x 3 doses as needed for chest pain (if no relief after 2nd dose, proceed to the ED for an evaluation or call 911). Place 0.4 mg under the tongue every 5 (five) minutes x 3 doses as needed for chest pain (if no relief after 2nd dose, proceed to the ED for an evaluation or call 911). 30 tablet 0   omeprazole (PRILOSEC) 40 MG capsule Take 1 capsule (40 mg total) by mouth daily. 30 capsule 3   pravastatin (PRAVACHOL) 20 MG tablet Take 1 tablet (20 mg total) by mouth daily. 90 tablet 0   TRESIBA FLEXTOUCH 100 UNIT/ML FlexTouch Pen Inject 65 Units into the skin in the morning and at bedtime. Inject 60 units in the AM and 70 units in the PM.      zonisamide (ZONEGRAN) 100 MG capsule Take 100 mg by mouth at bedtime.     Current Facility-Administered Medications  Medication Dose Route Frequency Provider Last Rate Last Admin   sodium chloride flush (NS) 0.9 % injection 3 mL  3 mL Intravenous Q12H Marylu Lund., NP       Allergies:  Divalproex sodium, Statins, Tramadol, Valproic acid, Gadolinium derivatives, and Tricor [fenofibrate]   ROS: No palpitations or syncope.  Physical Exam: VS:  BP 124/70 (BP Location: Left Arm, Patient Position: Sitting, Cuff Size: Normal)   Pulse 68   Ht 5' 9"  (1.753 m)   Wt 228 lb (103.4 kg)   SpO2 95%   BMI 33.67 kg/m , BMI Body mass index is 33.67 kg/m.  Wt Readings from Last 3 Encounters:  10/28/22 228 lb (103.4 kg)  09/29/22 228 lb 12.8 oz (103.8 kg)  08/27/22 229 lb (103.9 kg)    General: Patient appears comfortable at rest. HEENT: Conjunctiva and lids normal. Neck: Supple, no elevated JVP or carotid bruits. Lungs: Clear to auscultation, nonlabored breathing at rest. Cardiac: Regular rate and rhythm, no S3 or significant systolic murmur. Abdomen: Soft, nontender, bowel sounds present. Extremities: No pitting edema.  ECG:  An ECG dated 08/18/2022 was personally reviewed today and demonstrated:  Sinus rhythm.  Recent Labwork: 09/29/2022: ALT 29; AST 37; BUN 16; Creatinine, Ser 0.97; Hemoglobin 14.9; Platelets 99; Potassium 4.2; Sodium 140; TSH 1.590     Component Value Date/Time   CHOL 108 09/29/2022 1607   TRIG 112 09/29/2022 1607   HDL 32 (L) 09/29/2022 1607   CHOLHDL 3.4 09/29/2022 1607   CHOLHDL 6.8 07/28/2021 0531   VLDL 41 (H) 07/28/2021 0531   LDLCALC 55 09/29/2022 1607    Other Studies Reviewed Today:  Cardiac catheterization 08/20/2022:   Mid LAD lesion is 90% stenosed.   Ost LAD lesion is 50% stenosed.   Prox Cx lesion is 50% stenosed.   Mid RCA lesion is 40% stenosed.   Dist RCA lesion is 40% stenosed.   Origin to Prox Graft lesion is 40% stenosed.   1st Diag  lesion is 80% stenosed.   Ost RPDA lesion is 100% stenosed.   Prox LAD to Mid LAD lesion is 100% stenosed.  1st Mrg lesion is 100% stenosed.   SVG graft was visualized by angiography and is normal in caliber.   LIMA graft was visualized by angiography and is normal in caliber.   RIMA graft was visualized by angiography and is normal in caliber.   Severe triple vessel CAD s/p 3V CABG with 3/3 patent bypass grafts Chronic occlusion mid LAD. The mid and distal LAD fills from the patent LIMA graft Chronic occlusion obtuse marginal branch in the previously stented segment. Patent free RIMA graft to the obtuse marginal branch Large dominant RCA with mild diffuse disease. The PDA is chronically occluded. Patent SVG to PDA.    Recommendations: Continue medical management of CAD  Assessment and Plan:  1.  Multivessel CAD status post CABG with recent cardiac catheterization showing patient bypass grafts in October.  Plan is to continue medical therapy at this point in the absence of accelerating angina.  Currently on aspirin, Coreg, Zetia, Imdur, Pravachol, and as needed nitroglycerin.  2.  Mixed hyperlipidemia on Pravachol and Zetia.  Recent LDL 55.  3.  Essential hypertension.  Blood pressure is well-controlled today on current regimen.  No changes were made.  Medication Adjustments/Labs and Tests Ordered: Current medicines are reviewed at length with the patient today.  Concerns regarding medicines are outlined above.   Tests Ordered: No orders of the defined types were placed in this encounter.   Medication Changes: No orders of the defined types were placed in this encounter.   Disposition:  Follow up  6 months.  Signed, [name], MD, [facility] 10/28/2022 10:52 AM    [facility] [address] [place],  [address] Phone: [phone]; Fax: [phone]

## 2022-10-28 NOTE — Patient Instructions (Signed)
Medication Instructions:  Your physician recommends that you continue on your current medications as directed. Please refer to the Current Medication list given to you today.   Labwork: None  Testing/Procedures: None  Follow-Up: Follow up with Dr. Domenic Polite in 6 months.   Any Other Special Instructions Will Be Listed Below (If Applicable).     If you need a refill on your cardiac medications before your next appointment, please call your pharmacy.

## 2022-10-29 ENCOUNTER — Ambulatory Visit: Payer: Medicare Other | Admitting: Internal Medicine

## 2022-11-03 ENCOUNTER — Other Ambulatory Visit: Payer: Self-pay

## 2022-11-03 ENCOUNTER — Other Ambulatory Visit: Payer: Self-pay | Admitting: Internal Medicine

## 2022-11-03 ENCOUNTER — Encounter (HOSPITAL_COMMUNITY): Payer: Self-pay | Admitting: Emergency Medicine

## 2022-11-03 DIAGNOSIS — J101 Influenza due to other identified influenza virus with other respiratory manifestations: Secondary | ICD-10-CM | POA: Diagnosis not present

## 2022-11-03 DIAGNOSIS — Z1152 Encounter for screening for COVID-19: Secondary | ICD-10-CM | POA: Insufficient documentation

## 2022-11-03 DIAGNOSIS — R0602 Shortness of breath: Secondary | ICD-10-CM | POA: Insufficient documentation

## 2022-11-03 DIAGNOSIS — E1100 Type 2 diabetes mellitus with hyperosmolarity without nonketotic hyperglycemic-hyperosmolar coma (NKHHC): Secondary | ICD-10-CM

## 2022-11-03 DIAGNOSIS — M791 Myalgia, unspecified site: Secondary | ICD-10-CM | POA: Diagnosis not present

## 2022-11-03 DIAGNOSIS — Z5321 Procedure and treatment not carried out due to patient leaving prior to being seen by health care provider: Secondary | ICD-10-CM | POA: Insufficient documentation

## 2022-11-03 LAB — RESP PANEL BY RT-PCR (RSV, FLU A&B, COVID)  RVPGX2
Influenza A by PCR: POSITIVE — AB
Influenza B by PCR: NEGATIVE
Resp Syncytial Virus by PCR: NEGATIVE
SARS Coronavirus 2 by RT PCR: NEGATIVE

## 2022-11-03 MED ORDER — IBUPROFEN 800 MG PO TABS
800.0000 mg | ORAL_TABLET | Freq: Once | ORAL | Status: AC
  Administered 2022-11-03: 800 mg via ORAL
  Filled 2022-11-03: qty 1

## 2022-11-03 NOTE — ED Triage Notes (Signed)
Pt diagnosed with flu A Wed and since then has been having body aches and sob.

## 2022-11-04 ENCOUNTER — Emergency Department (HOSPITAL_COMMUNITY)
Admission: EM | Admit: 2022-11-04 | Discharge: 2022-11-04 | Discharge status: Against Medical Advice | Payer: Medicare Other | Attending: Emergency Medicine | Admitting: Emergency Medicine

## 2022-11-04 ENCOUNTER — Telehealth: Payer: Self-pay | Admitting: Internal Medicine

## 2022-11-04 NOTE — Telephone Encounter (Signed)
Patient wife advised

## 2022-11-04 NOTE — Telephone Encounter (Signed)
Pt wife called stating he is not acting like his normal self. States he was dx with the flu on 12.20.23. states he is still running a fever, vomiting, chills, talking out of his head & hallucinations, lethargic, trouble breathing when standing & very agitated. Went to ER last night & was told they may be able to get him seen in 5 hours. He was not able to sit in a chair that long. His wife is very worried about how he is acting she states this is not normal.

## 2022-11-04 NOTE — Telephone Encounter (Signed)
"  Call can not be completed at this time" --will retry in a little bit

## 2022-11-04 NOTE — Telephone Encounter (Signed)
My recommendation is for him to return to the emergency department for evaluation.

## 2022-11-13 ENCOUNTER — Ambulatory Visit (INDEPENDENT_AMBULATORY_CARE_PROVIDER_SITE_OTHER): Payer: 59 | Admitting: Internal Medicine

## 2022-11-13 ENCOUNTER — Encounter: Payer: Self-pay | Admitting: Internal Medicine

## 2022-11-13 ENCOUNTER — Encounter (HOSPITAL_COMMUNITY): Payer: Self-pay | Admitting: Hematology

## 2022-11-13 VITALS — BP 115/67 | HR 68 | Ht 69.0 in | Wt 221.6 lb

## 2022-11-13 DIAGNOSIS — E1100 Type 2 diabetes mellitus with hyperosmolarity without nonketotic hyperglycemic-hyperosmolar coma (NKHHC): Secondary | ICD-10-CM | POA: Diagnosis not present

## 2022-11-13 DIAGNOSIS — Z794 Long term (current) use of insulin: Secondary | ICD-10-CM

## 2022-11-13 DIAGNOSIS — E669 Obesity, unspecified: Secondary | ICD-10-CM | POA: Diagnosis not present

## 2022-11-13 MED ORDER — TIRZEPATIDE 2.5 MG/0.5ML ~~LOC~~ SOAJ: 2.5000 mg | SUBCUTANEOUS | 0 refills | Status: DC

## 2022-11-13 NOTE — Patient Instructions (Signed)
It was a pleasure to see you today.  Thank you for giving [place] the opportunity to be involved in your care.  Below is a brief recap of your visit and next steps.  We will plan to see you again in 4 weeks.  Summary Start [name] 2.5 mg weekly today We will check your urine for protein today Follow up in 4 weeks

## 2022-11-13 NOTE — Progress Notes (Unsigned)
Established Patient Office Visit  Subjective   Patient ID: [name] [name], male    DOB: [birth year]  Age: 67 y.o. MRN: [id number]  Chief Complaint  Patient presents with   Diabetes    Follow up   [name] returns to care today.  He was last seen by me on 11/21 to establish care.  Baseline labs were ordered and he was referred to pulmonology for OSA.  4-week follow-up was arranged to further discuss these mellitus.  In the interim been seen by cardiology for follow-up.  He also tested positive for influenza and presented to the emergency department on 12/26 because of his symptoms.  There have otherwise been no acute interval events. [name] reports feeling well today aside from a residual dry cough.  He has no acute concerns to discuss today.  Past Medical History:  Diagnosis Date   Anxiety    Arthritis    Asthma    Cataract    Right eye   Chronic lower back pain    Cirrhosis of liver (HCC)    Coronary atherosclerosis of native coronary artery    a. s/p multiple prior stents, s/p CABG in 01/2016 with LIMA-LAD, Free RIMA-OM2 and SVG-PDA b. 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   LIMA to LAD, free RIMA to OM2, SVG to PDA, open SVG harvest from right thigh   Stroke (HCC)    Thrombocytopenia (HCC)    TIA (transient ischemic attack)    Type 2 diabetes mellitus (HCC)    Past Surgical History:  Procedure Laterality Date   ANTERIOR CERVICAL DECOMP/DISCECTOMY FUSION  1998   "C3-4"   CARDIAC CATHETERIZATION  "several"   CARDIAC CATHETERIZATION N/A 12/30/2015   Procedure: Left Heart Cath and Coronary Angiography;  Surgeon: Runell Gess, MD;  [facility];  Service: Cardiovascular;  Laterality: N/A;   CARPAL TUNNEL RELEASE Bilateral 2005   CHOLECYSTECTOMY N/A 11/28/2018   Procedure: LAPAROSCOPIC CHOLECYSTECTOMY;  Surgeon: Lucretia Roers, MD;  [facility];  Service: General;  Laterality: N/A;   COLONOSCOPY     COLONOSCOPY WITH PROPOFOL N/A 09/27/2020   Procedure: COLONOSCOPY WITH PROPOFOL;  Surgeon: Dolores Frame, MD;  [facility];  Service: Gastroenterology;  Laterality: N/A;   CORONARY ANGIOPLASTY     CORONARY ANGIOPLASTY WITH STENT PLACEMENT  2002; 2003; 11/20/2014   "I have 4 stents after today" (11/20/2014)   CORONARY ARTERY BYPASS GRAFT N/A 01/09/2016   Procedure: CORONARY ARTERY BYPASS GRAFTING (CABG) X 3 UTILIZING RIGHT AND LEFT INTERNAL MAMMARY ARTERY AND ENDOSCOPICALLY HARVESTED SAPHENEOUS VEIN.;  Surgeon: Purcell Nails, MD;  Location: [facility];  Service: Open Heart Surgery;  Laterality: N/A;   ESOPHAGOGASTRODUODENOSCOPY     ESOPHAGOGASTRODUODENOSCOPY (EGD) WITH PROPOFOL N/A 09/27/2020   Procedure: ESOPHAGOGASTRODUODENOSCOPY (EGD) WITH PROPOFOL;  Surgeon: Dolores Frame, MD;  [facility];  Service: Gastroenterology;  Laterality: N/A;  10   KNEE SURGERY Left 02/2012   "scraped; open"   LEFT HEART CATH AND CORS/GRAFTS ANGIOGRAPHY N/A 08/09/2017   Procedure:  LEFT HEART CATH AND CORS/GRAFTS ANGIOGRAPHY;  Surgeon: [name], MD;  Location: [place] CV LAB;  Service: Cardiovascular;  Laterality: N/A;   LEFT HEART CATH AND CORS/GRAFTS ANGIOGRAPHY N/A 11/22/2018   Procedure: LEFT HEART CATH AND CORS/GRAFTS ANGIOGRAPHY;  Surgeon: [name], [name], MD;  Location: [place] CV LAB;  Service: Cardiovascular;  Laterality: N/A;   LEFT HEART CATH AND CORS/GRAFTS ANGIOGRAPHY N/A 08/20/2022   Procedure: LEFT HEART CATH AND CORS/GRAFTS ANGIOGRAPHY;  Surgeon: Burnell Blanks, MD;  Location: [place] CV LAB;  Service: Cardiovascular;  Laterality: N/A;   LEFT HEART  CATHETERIZATION WITH CORONARY ANGIOGRAM N/A 07/20/2012   Procedure: LEFT HEART CATHETERIZATION WITH CORONARY ANGIOGRAM;  Surgeon: [name], MD;  Location: [place] CATH LAB;  Service: Cardiovascular;  Laterality: N/A;   LEFT HEART CATHETERIZATION WITH CORONARY ANGIOGRAM N/A 11/20/2014   Procedure: LEFT HEART CATHETERIZATION WITH CORONARY ANGIOGRAM;  Surgeon: [name], MD;  Location: [facility] CATH LAB;  Service: Cardiovascular;  Laterality: N/A;   LEFT HEART CATHETERIZATION WITH CORONARY ANGIOGRAM N/A 11/26/2014   Procedure: LEFT HEART CATHETERIZATION WITH CORONARY ANGIOGRAM;  Surgeon: [name], MD;  Location: [facility] CATH LAB;  Service: Cardiovascular;  Laterality: N/A;   NEUROPLASTY / TRANSPOSITION ULNAR NERVE AT ELBOW Right ~ 2012   PERCUTANEOUS CORONARY ROTOBLATOR INTERVENTION (PCI-R)  11/20/2014   Procedure: PERCUTANEOUS CORONARY ROTOBLATOR INTERVENTION (PCI-R);  Surgeon: [name], MD;  Location: [facility] CATH LAB;  Service: Cardiovascular;;   POSTERIOR CERVICAL LAMINECTOMY Left 04/16/2022   Procedure: Laminectomy and Foraminotomy - left - C6-C7;  Surgeon: Earnie Larsson, MD;  Location: [place];  Service: Neurosurgery;  Laterality: Left;  3C   SHOULDER ARTHROSCOPY Left ~ 2011   TEE WITHOUT CARDIOVERSION N/A 01/09/2016   Procedure: TRANSESOPHAGEAL ECHOCARDIOGRAM (TEE);  Surgeon: Rexene Alberts, MD;  Location: [place];  Service: Open Heart Surgery;  Laterality: N/A;   Social History   Tobacco Use   Smoking status: Never   Smokeless tobacco: Never  Vaping Use   Vaping Use: Never used  Substance Use Topics   Alcohol use: No   Drug use: No   Family History  Problem Relation Age of Onset   Stroke Mother    Coronary artery disease Father 38   Asthma Sister    Multiple sclerosis Brother    Heart disease Paternal Uncle    Stroke Maternal Grandmother    Heart attack Paternal Grandmother    Cancer Paternal Grandfather    Asthma Sister    Seizures Son    Migraines Son    Autism Son    Migraines  Son    Allergies  Allergen Reactions   Divalproex Sodium Other (See Comments)    Causes anger   Statins Other (See Comments)    Muscle aches and cramps   Tramadol Other (See Comments)    Chest pain    Valproic Acid Other (See Comments)    Causes anger   Gadolinium Derivatives Nausea And Vomiting    07/25/19 Pt vomited immediately after IV gad. Denies itching, dyspnea.  (Adverse, not allergic, reaction   Tricor [Fenofibrate] Other (See Comments)    Leg cramps   Review of Systems  Respiratory:  Positive for cough.   All other systems reviewed and are negative.    Objective:     BP 115/67   Pulse 68   Ht 5\' 9"  (1.753 m)   Wt 221 lb 9.6 oz (100.5 kg)   SpO2 96%   BMI 32.72 kg/m  BP  Readings from Last 3 Encounters:  11/13/22 115/67  11/03/22 (!) 156/71  10/28/22 124/70   Physical Exam Vitals reviewed.  Constitutional:      General: He is not in acute distress.    Appearance: Normal appearance. He is obese. He is not ill-appearing.  HENT:     Head: Normocephalic and atraumatic.     Nose: Nose normal. No congestion or rhinorrhea.     Mouth/Throat:     Mouth: Mucous membranes are moist.     Pharynx: Oropharynx is clear.  Eyes:     Extraocular Movements: Extraocular movements intact.     Conjunctiva/sclera: Conjunctivae normal.     Pupils: Pupils are equal, round, and reactive to light.  Cardiovascular:     Rate and Rhythm: Normal rate and regular rhythm.     Pulses: Normal pulses.     Heart sounds: Murmur heard.  Pulmonary:     Effort: Pulmonary effort is normal.     Breath sounds: Normal breath sounds. No wheezing, rhonchi or rales.  Abdominal:     General: Abdomen is flat. Bowel sounds are normal. There is no distension.     Palpations: Abdomen is soft.     Tenderness: There is no abdominal tenderness.  Musculoskeletal:        General: No swelling or deformity. Normal range of motion.     Cervical back: Normal range of motion.  Skin:    General: Skin is  warm and dry.     Capillary Refill: Capillary refill takes less than 2 seconds.  Neurological:     General: No focal deficit present.     Mental Status: He is alert and oriented to person, place, and time.     Motor: No weakness.  Psychiatric:        Mood and Affect: Mood normal.        Behavior: Behavior normal.        Thought Content: Thought content normal.    Last CBC Lab Results  Component Value Date   WBC 3.7 09/29/2022   HGB 14.9 09/29/2022   HCT 43.6 09/29/2022   MCV 94 09/29/2022   MCH 32.3 09/29/2022   RDW 13.6 09/29/2022   PLT 99 (LL) 09/29/2022   Last metabolic panel Lab Results  Component Value Date   GLUCOSE 176 (H) 09/29/2022   NA 140 09/29/2022   K 4.2 09/29/2022   CL 105 09/29/2022   CO2 23 09/29/2022   BUN 16 09/29/2022   CREATININE 0.97 09/29/2022   EGFR 87 09/29/2022   CALCIUM 9.2 09/29/2022   PROT 7.2 09/29/2022   ALBUMIN 3.7 (L) 09/29/2022   LABGLOB 3.5 09/29/2022   AGRATIO 1.1 (L) 09/29/2022   BILITOT 0.4 09/29/2022   ALKPHOS 63 09/29/2022   AST 37 09/29/2022   ALT 29 09/29/2022   ANIONGAP 9 08/14/2022   Last lipids Lab Results  Component Value Date   CHOL 108 09/29/2022   HDL 32 (L) 09/29/2022   LDLCALC 55 09/29/2022   TRIG 112 09/29/2022   CHOLHDL 3.4 09/29/2022   Last hemoglobin A1c Lab Results  Component Value Date   HGBA1C 7.8 (H) 09/29/2022   Last thyroid functions Lab Results  Component Value Date   TSH 1.590 09/29/2022   Last vitamin D Lab Results  Component Value Date   VD25OH 25.8 (L) 09/29/2022   Last vitamin B12 and Folate Lab Results  Component Value Date   VITAMINB12 738 09/29/2022   FOLATE 12.7 09/29/2022     Assessment &  Plan:   Problem List Items Addressed This Visit       Type 2 diabetes mellitus ([place]) - Primary    A1c 7.8 in November.  He is currently prescribed metformin 1000 mg twice daily, Tresiba 60 units every morning and 70 units nightly, and Humalog sliding scale with meals (usually  10-15 units).  He now has a freestyle libre and reports that his numbers been consistently in the "green zone".  He denies polyuria/polydipsia today.  We had previously discussed adding GLP-1 therapy at his last appointment.  He remains interested in this today. -Start [name] 2.5 mg weekly injections -Continue current insulin regimen -Urine microalbumin/creatinine ratio ordered today -Follow-up in 4 weeks for reassessment      Return in about 4 weeks (around 12/11/2022) for DM.    [name], MD

## 2022-11-16 ENCOUNTER — Telehealth: Payer: Self-pay | Admitting: Internal Medicine

## 2022-11-16 LAB — MICROALBUMIN / CREATININE URINE RATIO
Creatinine, Urine: 142.3 mg/dL
Microalb/Creat Ratio: 126 mg/g creat — ABNORMAL HIGH (ref 0–29)
Microalbumin, Urine: 179.6 ug/mL

## 2022-11-16 NOTE — Telephone Encounter (Signed)
Patient advised.

## 2022-11-16 NOTE — Telephone Encounter (Signed)
Patient called in regard to lab results   Patient wants a call back in regard to protein results

## 2022-11-18 NOTE — Assessment & Plan Note (Addendum)
A1c 7.8 in November.  He is currently prescribed metformin 1000 mg twice daily, Tresiba 60 units every morning and 70 units nightly, and Humalog sliding scale with meals (usually 10-15 units).  He now has a freestyle libre and reports that his numbers been consistently in the "green zone".  He denies polyuria/polydipsia today.  We had previously discussed adding GLP-1 therapy at his last appointment.  He remains interested in this today. -Start [name] 2.5 mg weekly injections -Continue current insulin regimen -Urine microalbumin/creatinine ratio ordered today -Follow-up in 4 weeks for reassessment

## 2022-11-23 ENCOUNTER — Other Ambulatory Visit: Payer: Self-pay | Admitting: Internal Medicine

## 2022-11-23 ENCOUNTER — Ambulatory Visit (INDEPENDENT_AMBULATORY_CARE_PROVIDER_SITE_OTHER): Payer: 59

## 2022-11-23 VITALS — BP 106/67 | HR 76 | Ht 69.0 in | Wt 222.0 lb

## 2022-11-23 DIAGNOSIS — Z794 Long term (current) use of insulin: Secondary | ICD-10-CM

## 2022-11-23 DIAGNOSIS — Z0000 Encounter for general adult medical examination without abnormal findings: Secondary | ICD-10-CM

## 2022-11-23 NOTE — Patient Instructions (Signed)
  Mr. Mathes , Thank you for taking time to come for your Medicare Wellness Visit. I appreciate your ongoing commitment to your health goals. Please review the following plan we discussed and let me know if I can assist you in the future.   These are the goals we discussed:  Goals      Patient Stated     None        This is a list of the screening recommended for you and due dates:  Health Maintenance  Topic Date Due   COVID-19 Vaccine (1) Never done   Complete foot exam   Never done   Eye exam for diabetics  Never done   DTaP/Tdap/Td vaccine (1 - Tdap) Never done   Zoster (Shingles) Vaccine (1 of 2) Never done   Hemoglobin A1C  03/30/2023   Yearly kidney function blood test for diabetes  09/30/2023   Yearly kidney health urinalysis for diabetes  11/14/2023   Medicare Annual Wellness Visit  11/24/2023   Colon Cancer Screening  09/27/2030   Flu Shot  Completed   Hepatitis C Screening: USPSTF Recommendation to screen - Ages 18-79 yo.  Completed   HIV Screening  Completed   HPV Vaccine  Aged Out

## 2022-11-23 NOTE — Progress Notes (Signed)
Subjective:   [name] [name] is a 67 y.o. male who presents for Medicare Annual/Subsequent preventive examination.  Review of Systems           Objective:    There were no vitals filed for this visit. There is no height or weight on file to calculate BMI.     11/03/2022   10:02 PM 08/20/2022    6:26 AM 08/14/2022   10:07 PM 07/30/2022    5:54 PM 04/16/2022    7:12 AM 12/12/2021   10:07 AM 09/19/2021    9:23 AM  Advanced Directives  Does Patient Have a Medical Advance Directive? No Yes Yes Yes Yes No Yes  Type of Corporate treasurer of [place];Living will [place];Living will [place];Living will [place];Living will  Living will;Healthcare Power of Attorney  Does patient want to make changes to medical advance directive?   No - Patient declined No - Patient declined No - Patient declined  No - Patient declined  Copy of [place] in Chart?    No - copy requested Yes - validated most recent copy scanned in chart (See row information)    Would patient like information on creating a medical advance directive? No - Patient declined      No - Patient declined    Current Medications (verified) Outpatient Encounter Medications as of 11/23/2022  Medication Sig   aspirin EC 81 MG tablet Take 81 mg by mouth daily. Swallow whole.   carvedilol (COREG) 3.125 MG tablet Take 1 tablet (3.125 mg total) by mouth 2 (two) times daily.   citalopram (CELEXA) 20 MG tablet Take 1 tablet (20 mg total) by mouth at bedtime.   Continuous Blood Gluc Receiver (FREESTYLE LIBRE 2 READER) DEVI 1 Device by Does not apply route continuous.   Continuous Blood Gluc Sensor (FREESTYLE LIBRE 2 SENSOR) MISC USE AS DIRECTED   cyclobenzaprine (FLEXERIL) 10 MG tablet Take 1 tablet (10 mg total) by mouth 3 (three) times daily as needed for muscle spasms.   ezetimibe (ZETIA) 10 MG tablet Take 1 tablet (10 mg total) by mouth daily.    fluticasone (FLONASE) 50 MCG/ACT nasal spray Place 2 sprays into both nostrils 2 (two) times daily.   Fluticasone-Salmeterol (ADVAIR) 250-50 MCG/DOSE AEPB Inhale 1 puff into the lungs every 12 (twelve) hours.   furosemide (LASIX) 20 MG tablet Take 2 tablets (40 mg total) by mouth daily.   gabapentin (NEURONTIN) 300 MG capsule Take 1 capsule (300 mg total) by mouth 3 (three) times daily. Take 300 mg by mouth 3 (three) times daily.   HUMALOG KWIKPEN 100 UNIT/ML KwikPen Inject 5-30 Units into the skin 3 (three) times daily as needed (blood sugar of 200 or higher). Sliding scale   isosorbide mononitrate (IMDUR) 60 MG 24 hr tablet Take one (1) tablet by mouth ( 60 mg ) in the am and one half tablet ( 30 mg) in the pm.   Melatonin 10 MG TABS Take 10 mg by mouth at bedtime.   metFORMIN (GLUCOPHAGE) 1000 MG tablet Take 1 tablet (1,000 mg total) by mouth 2 (two) times daily.   nitroGLYCERIN (NITROSTAT) 0.4 MG SL tablet Place 1 tablet (0.4 mg total) under the tongue every 5 (five) minutes x 3 doses as needed for chest pain (if no relief after 2nd dose, proceed to the ED for an evaluation or call 911). Place 0.4 mg under the tongue every 5 (five) minutes x  3 doses as needed for chest pain (if no relief after 2nd dose, proceed to the ED for an evaluation or call 911).   omeprazole (PRILOSEC) 40 MG capsule Take 1 capsule (40 mg total) by mouth daily.   pravastatin (PRAVACHOL) 20 MG tablet Take 1 tablet (20 mg total) by mouth daily.   tirzepatide [facility]) 2.5 MG/0.5ML Pen Inject 2.5 mg into the skin once a week.   TRESIBA FLEXTOUCH 100 UNIT/ML FlexTouch Pen Inject 65 Units into the skin in the morning and at bedtime. Inject 60 units in the AM and 70 units in the PM.   zonisamide (ZONEGRAN) 100 MG capsule Take 100 mg by mouth at bedtime.   Facility-Administered Encounter Medications as of 11/23/2022  Medication   sodium chloride flush (NS) 0.9 % injection 3 mL    Allergies (verified) Divalproex sodium,  Statins, Tramadol, Valproic acid, Gadolinium derivatives, and Tricor [fenofibrate]   History: Past Medical History:  Diagnosis Date   Anxiety    Arthritis    Asthma    Cataract    Right eye   Chronic lower back pain    Cirrhosis of liver (HCC)    Coronary atherosclerosis of native coronary artery    a. s/p multiple prior stents, s/p CABG in 01/2016 with LIMA-LAD, Free RIMA-OM2 and SVG-PDA b. 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   LIMA to LAD, free RIMA to OM2, SVG to PDA, open SVG harvest from right thigh   Stroke (HCC)    Thrombocytopenia (HCC)    TIA (transient ischemic attack)    Type 2 diabetes mellitus (HCC)    Past Surgical History:  Procedure Laterality Date   ANTERIOR CERVICAL DECOMP/DISCECTOMY FUSION  1998   "C3-4"   CARDIAC CATHETERIZATION  "several"   CARDIAC CATHETERIZATION N/A 12/30/2015   Procedure: Left Heart Cath and Coronary Angiography;  Surgeon: Runell Gess, MD;  [facility]: [facility] INVASIVE CV LAB;  Service: Cardiovascular;  Laterality: N/A;   CARPAL TUNNEL RELEASE Bilateral 2005   CHOLECYSTECTOMY N/A 11/28/2018   Procedure: LAPAROSCOPIC CHOLECYSTECTOMY;  Surgeon: Lucretia Roers, MD;  Location: [facility] ORS;  Service: General;  Laterality: N/A;   COLONOSCOPY     COLONOSCOPY WITH PROPOFOL N/A 09/27/2020   Procedure: COLONOSCOPY WITH PROPOFOL;  Surgeon: Dolores Frame, MD;  Location: [facility];  Service: Gastroenterology;  Laterality: N/A;   CORONARY ANGIOPLASTY     CORONARY ANGIOPLASTY WITH STENT PLACEMENT  2002; 2003; 11/20/2014   "I have 4 stents after today" (11/20/2014)   CORONARY ARTERY BYPASS GRAFT N/A 01/09/2016    Procedure: CORONARY ARTERY BYPASS GRAFTING (CABG) X 3 UTILIZING RIGHT AND LEFT INTERNAL MAMMARY ARTERY AND ENDOSCOPICALLY HARVESTED SAPHENEOUS VEIN.;  Surgeon: Purcell Nails, MD;  Location: [facility] OR;  Service: Open Heart Surgery;  Laterality: N/A;   ESOPHAGOGASTRODUODENOSCOPY     ESOPHAGOGASTRODUODENOSCOPY (EGD) WITH PROPOFOL N/A 09/27/2020   Procedure: ESOPHAGOGASTRODUODENOSCOPY (EGD) WITH PROPOFOL;  Surgeon: Dolores Frame, MD;  [facility];  Service: Gastroenterology;  Laterality: N/A;  10   KNEE SURGERY Left 02/2012   "scraped;  open"   LEFT HEART CATH AND CORS/GRAFTS ANGIOGRAPHY N/A 08/09/2017   Procedure: LEFT HEART CATH AND CORS/GRAFTS ANGIOGRAPHY;  Surgeon: Yvonne Kendall, MD;  [facility];  Service: Cardiovascular;  Laterality: N/A;   LEFT HEART CATH AND CORS/GRAFTS ANGIOGRAPHY N/A 11/22/2018   Procedure: LEFT HEART CATH AND CORS/GRAFTS ANGIOGRAPHY;  Surgeon: Swaziland, [name], MD;  Location: [facility] INVASIVE CV LAB;  Service: Cardiovascular;  Laterality: N/A;   LEFT HEART CATH AND CORS/GRAFTS ANGIOGRAPHY N/A 08/20/2022   Procedure: LEFT HEART CATH AND CORS/GRAFTS ANGIOGRAPHY;  Surgeon: Kathleene Hazel, MD;  [facility];  Service: Cardiovascular;  Laterality: N/A;   LEFT HEART CATHETERIZATION WITH CORONARY ANGIOGRAM N/A 07/20/2012   Procedure: LEFT HEART CATHETERIZATION WITH CORONARY ANGIOGRAM;  Surgeon: Iran Ouch, MD;  [facility];  Service: Cardiovascular;  Laterality: N/A;   LEFT HEART CATHETERIZATION WITH CORONARY ANGIOGRAM N/A 11/20/2014   Procedure: LEFT HEART CATHETERIZATION WITH CORONARY ANGIOGRAM;  Surgeon: [name] Swaziland, MD;  Location: [facility] CATH LAB;  Service: Cardiovascular;  Laterality: N/A;   LEFT HEART CATHETERIZATION WITH CORONARY ANGIOGRAM N/A 11/26/2014   Procedure: LEFT HEART CATHETERIZATION WITH CORONARY ANGIOGRAM;  Surgeon: [name] Swaziland, MD;  Location: [facility] CATH LAB;  Service: Cardiovascular;   Laterality: N/A;   NEUROPLASTY / TRANSPOSITION ULNAR NERVE AT ELBOW Right ~ 2012   PERCUTANEOUS CORONARY ROTOBLATOR INTERVENTION (PCI-R)  11/20/2014   Procedure: PERCUTANEOUS CORONARY ROTOBLATOR INTERVENTION (PCI-R);  Surgeon: [name] Swaziland, MD;  Location: [facility] CATH LAB;  Service: Cardiovascular;;   POSTERIOR CERVICAL LAMINECTOMY Left 04/16/2022   Procedure: Laminectomy and Foraminotomy - left - C6-C7;  Surgeon: Julio Sicks, MD;  [facility] OR;  Service: Neurosurgery;  Laterality: Left;  3C   SHOULDER ARTHROSCOPY Left ~ 2011   TEE WITHOUT CARDIOVERSION N/A 01/09/2016   Procedure: TRANSESOPHAGEAL ECHOCARDIOGRAM (TEE);  Surgeon: Purcell Nails, MD;  Location: [facility] OR;  Service: Open Heart Surgery;  Laterality: N/A;   Family History  Problem Relation Age of Onset   Stroke Mother    Coronary artery disease Father 39   Asthma Sister    Multiple sclerosis Brother    Heart disease Paternal Uncle    Stroke Maternal Grandmother    Heart attack Paternal Grandmother    Cancer Paternal Grandfather    Asthma Sister    Seizures Son    Migraines Son    Autism Son    Migraines Son    Social History   Socioeconomic History   Marital status: Married    Spouse name: [name]   Number of children: 3   Years of education: 12 th    Highest education level: Not on file  Occupational History   Occupation: Unemployed  Tobacco Use   Smoking status: Never   Smokeless tobacco: Never  Vaping Use   Vaping Use: Never used  Substance and Sexual Activity   Alcohol use: No   Drug use: No   Sexual activity: Not on file  Other Topics Concern   Not on file  Social History Narrative   Patient lives at home with wife [name].   Patient works at [employer], Social research officer, government.   Patient has a 12 th grade education.    Patient has 3 children.       Social Determinants of Health   Financial Resource Strain: Low Risk  (02/25/2021)   Overall Financial Resource Strain (CARDIA)    Difficulty of Paying  Living Expenses: Not hard at all  Food  Insecurity: No Food Insecurity (02/25/2021)   Hunger Vital Sign    Worried About Running Out of Food in the Last Year: Never true    Ran Out of Food in the Last Year: Never true  Transportation Needs: No Transportation Needs (02/25/2021)   PRAPARE - Administrator, Civil ServiceTransportation    Lack of Transportation (Medical): No    Lack of Transportation (Non-Medical): No  Physical Activity: Sufficiently Active (02/25/2021)   Exercise Vital Sign    Days of Exercise per Week: 6 days    Minutes of Exercise per Session: 60 min  Stress: No Stress Concern Present (02/25/2021)   [employer] - Occupational Stress Questionnaire    Feeling of Stress : Not at all  Social Connections: Socially Integrated (02/25/2021)   Social Connection and Isolation Panel [NHANES]    Frequency of Communication with Friends and Family: More than three times a week    Frequency of Social Gatherings with Friends and Family: More than three times a week    Attends Religious Services: More than 4 times per year    Active Member of [employer] Financial[employer]ubs or Organizations: Yes    Attends Engineer, structuralClub or Organization Meetings: More than 4 times per year    Marital Status: Married    Tobacco Counseling Counseling given: Not Answered   Clinical Intake:    Diabetic?yes Nutrition Risk Assessment:  Has the patient had any N/V/D within the last 2 months?  No  Does the patient have any non-healing wounds?  No  Has the patient had any unintentional weight loss or weight gain?  No   Diabetes:  Is the patient diabetic?  Yes  If diabetic, was a CBG obtained today?  No  Did the patient bring in their glucometer from home?  No  How often do you monitor your CBG's? daily.   Financial Strains and Diabetes Management:  Are you having any financial strains with the device, your supplies or your medication? No .  Does the patient want to be seen by Chronic Care Management for management of their diabetes?   No  Would the patient like to be referred to a Nutritionist or for Diabetic Management?  No   Activities of Daily Living    04/16/2022    7:24 AM 04/16/2022    7:21 AM  In your present state of health, do you have any difficulty performing the following activities:  Hearing?  1  Vision?  0  Difficulty concentrating or making decisions?  0  Walking or climbing stairs?  0  Dressing or bathing?  0  Doing errands, shopping? 0     Patient Care Team: Billie Ladeixon, Phillip E, MD as PCP - General (Internal Medicine) Jonelle SidleMcDowell, Samuel G, MD as PCP - Cardiology (Cardiology)  Indicate any recent Medical Services you may have received from other than [facility] providers in the past year (date may be approximate).     Assessment:   This is a routine wellness examination [facility].  Hearing/Vision screen No results found.  Dietary issues and exercise activities discussed:     Goals Addressed   None   Depression Screen    11/13/2022    1:34 PM 09/29/2022    3:00 PM 02/25/2021   10:40 AM 01/08/2015    9:13 AM  PHQ 2/9 Scores  PHQ - 2 Score 0 0 0 0  PHQ- 9 Score 1 0      Fall Risk    11/13/2022    1:34 PM 09/29/2022  3:00 PM 08/30/2018    2:16 PM 01/08/2015    9:13 AM  Fall Risk   Falls in the past year? 0 0 No No  Number falls in past yr: 0 0    Injury with Fall? 0 0    Risk for fall due to : No Fall Risks No Fall Risks    Follow up Falls evaluation completed Falls evaluation completed      [place]:  Any stairs in or around the home? No  If so, are there any without handrails? No  Home free of loose throw rugs in walkways, pet beds, electrical cords, etc? Yes  Adequate lighting in your home to reduce risk of falls? Yes   ASSISTIVE DEVICES UTILIZED TO PREVENT FALLS:  Life alert? No  Use of a cane, walker or w/c? No  Grab bars in the bathroom? Yes  Shower chair or bench in shower? No  Elevated toilet seat or a handicapped toilet? No     Immunizations Immunization History  Administered Date(s) Administered   Influenza Split 07/21/2012   Influenza,inj,Quad PF,6+ Mos 12/05/2013, 08/11/2017, 11/21/2018, 09/06/2019, 07/28/2021, 09/29/2022    TDAP status: Due, Education has been provided regarding the importance of this vaccine. Advised may receive this vaccine at local pharmacy or Health Dept. Aware to provide a copy of the vaccination record if obtained from local pharmacy or Health Dept. Verbalized acceptance and understanding.  Flu Vaccine status: Up to date   Covid-19 vaccine status: Information provided on how to obtain vaccines.   Qualifies for Shingles Vaccine? Yes   Zostavax completed No   Shingrix Completed?: No.    Education has been provided regarding the importance of this vaccine. Patient has been advised to call insurance company to determine out of pocket expense if they have not yet received this vaccine. Advised may also receive vaccine at local pharmacy or Health Dept. Verbalized acceptance and understanding.  Screening Tests Health Maintenance  Topic Date Due   COVID-19 Vaccine (1) Never done   FOOT EXAM  Never done   OPHTHALMOLOGY EXAM  Never done   DTaP/Tdap/Td (1 - Tdap) Never done   Zoster Vaccines- Shingrix (1 of 2) Never done   HEMOGLOBIN A1C  03/30/2023   Diabetic kidney evaluation - eGFR measurement  09/30/2023   Diabetic kidney evaluation - Urine ACR  11/14/2023   Medicare Annual Wellness (AWV)  11/24/2023   COLONOSCOPY (Pts 45-74yrs Insurance coverage will need to be confirmed)  09/27/2030   INFLUENZA VACCINE  Completed   Hepatitis C Screening  Completed   HIV Screening  Completed   HPV VACCINES  Aged Out    Health Maintenance  Health Maintenance Due  Topic Date Due   COVID-19 Vaccine (1) Never done   FOOT EXAM  Never done   OPHTHALMOLOGY EXAM  Never done   DTaP/Tdap/Td (1 - Tdap) Never done   Zoster Vaccines- Shingrix (1 of 2) Never done    Colorectal cancer screening:  Type of screening: Colonoscopy. Completed 09/27/2020. Repeat every 10 years  Lung Cancer Screening: (Low Dose CT Chest recommended if Age 31-80 years, 30 pack-year currently smoking OR have quit w/in 15years.) does not qualify.   Lung Cancer Screening Referral: NO  Additional Screening:  Hepatitis C Screening: does qualify; Completed 12/02/2018   Vision Screening: Recommended annual ophthalmology exams for early detection of glaucoma and other disorders of the eye. Is the patient up to date with their annual eye exam?  No  Who is the provider or what is the name of the office in which the patient attends annual eye exams? N/a If pt is not established with a provider, would they like to be referred to a provider to establish care? No .   Dental Screening: Recommended annual dental exams for proper oral hygiene  Community Resource Referral / Chronic Care Management: CRR required this visit?  No   CCM required this visit?  No      Plan:     I have personally reviewed and noted the following in the patient's chart:   Medical and social history Use of alcohol, tobacco or illicit drugs  Current medications and supplements including opioid prescriptions. Patient is not currently taking opioid prescriptions. Functional ability and status Nutritional status Physical activity Advanced directives List of other physicians Hospitalizations, surgeries, and ER visits in previous 12 months Vitals Screenings to include cognitive, depression, and falls Referrals and appointments  In addition, I have reviewed and discussed with patient certain preventive protocols, quality metrics, and best practice recommendations. A written personalized care plan for preventive services as well as general preventive health recommendations were provided to patient.     Jasper Riling, CMA   11/23/2022

## 2022-12-07 ENCOUNTER — Other Ambulatory Visit: Payer: Self-pay | Admitting: Internal Medicine

## 2022-12-07 DIAGNOSIS — E1100 Type 2 diabetes mellitus with hyperosmolarity without nonketotic hyperglycemic-hyperosmolar coma (NKHHC): Secondary | ICD-10-CM

## 2022-12-09 ENCOUNTER — Encounter (HOSPITAL_COMMUNITY): Payer: Self-pay

## 2022-12-09 ENCOUNTER — Other Ambulatory Visit: Payer: Self-pay

## 2022-12-09 ENCOUNTER — Emergency Department (HOSPITAL_COMMUNITY)
Admission: EM | Admit: 2022-12-09 | Discharge: 2022-12-09 | Disposition: A | Discharge status: Home / Self Care | Payer: 59 | Attending: Emergency Medicine | Admitting: Emergency Medicine

## 2022-12-09 ENCOUNTER — Emergency Department (HOSPITAL_COMMUNITY): Payer: 59

## 2022-12-09 DIAGNOSIS — I2510 Atherosclerotic heart disease of native coronary artery without angina pectoris: Secondary | ICD-10-CM | POA: Diagnosis not present

## 2022-12-09 DIAGNOSIS — R0789 Other chest pain: Secondary | ICD-10-CM | POA: Diagnosis not present

## 2022-12-09 DIAGNOSIS — R079 Chest pain, unspecified: Secondary | ICD-10-CM | POA: Diagnosis not present

## 2022-12-09 DIAGNOSIS — R0602 Shortness of breath: Secondary | ICD-10-CM | POA: Diagnosis not present

## 2022-12-09 DIAGNOSIS — Z794 Long term (current) use of insulin: Secondary | ICD-10-CM | POA: Insufficient documentation

## 2022-12-09 DIAGNOSIS — Z7982 Long term (current) use of aspirin: Secondary | ICD-10-CM | POA: Insufficient documentation

## 2022-12-09 DIAGNOSIS — E119 Type 2 diabetes mellitus without complications: Secondary | ICD-10-CM | POA: Insufficient documentation

## 2022-12-09 DIAGNOSIS — Z7984 Long term (current) use of oral hypoglycemic drugs: Secondary | ICD-10-CM | POA: Diagnosis not present

## 2022-12-09 DIAGNOSIS — Z951 Presence of aortocoronary bypass graft: Secondary | ICD-10-CM | POA: Diagnosis not present

## 2022-12-09 LAB — BASIC METABOLIC PANEL
Anion gap: 8 (ref 5–15)
BUN: 14 mg/dL (ref 8–23)
CO2: 20 mmol/L — ABNORMAL LOW (ref 22–32)
Calcium: 8.6 mg/dL — ABNORMAL LOW (ref 8.9–10.3)
Chloride: 110 mmol/L (ref 98–111)
Creatinine, Ser: 1.02 mg/dL (ref 0.61–1.24)
GFR, Estimated: >60 mL/min (ref >60)
Glucose, Bld: 137 mg/dL — ABNORMAL HIGH (ref 70–99)
Potassium: 3.5 mmol/L (ref 3.5–5.1)
Sodium: 138 mmol/L (ref 135–145)

## 2022-12-09 LAB — CBC
HCT: 41.9 % (ref 39.0–52.0)
Hemoglobin: 14.8 g/dL (ref 13.0–17.0)
MCH: 31.4 pg (ref 26.0–34.0)
MCHC: 35.3 g/dL (ref 30.0–36.0)
MCV: 88.8 fL (ref 80.0–100.0)
RBC: 4.72 MIL/uL (ref 4.22–5.81)
RDW: 13.6 % (ref 11.5–15.5)
WBC: 2.8 10*3/uL — ABNORMAL LOW (ref 4.0–10.5)
nRBC: 0 % (ref 0.0–0.2)

## 2022-12-09 LAB — TROPONIN I (HIGH SENSITIVITY)
Troponin I (High Sensitivity): 3 ng/L (ref <18)
Troponin I (High Sensitivity): 3 ng/L (ref <18)

## 2022-12-09 NOTE — ED Provider Notes (Signed)
[facility] Provider Note   CSN: [id number] Arrival date & time: 12/09/22  1252     History  Chief Complaint  Patient presents with   Chest Pain   HPI [name] [name] is a 67 y.o. male with CAD status post CABG x 3, type 2 diabetes, hypercholesterolemia, and stroke presenting for chest pain.  Started 2 days ago.  Located in the middle of his chest and radiates to his left neck and down his left shoulder.  Feels like pressure.  Endorses associated shortness of breath and diaphoresis.  Chest pain is worse with exertion.  No chest pain at rest.  Has not take any medications for symptoms.  States Dr. Domenic Polite is his cardiologist.  Denies calf tenderness and recent long trips.   Chest Pain      Home Medications Prior to Admission medications   Medication Sig Start Date End Date Taking? Authorizing Provider  albuterol (VENTOLIN HFA) 108 (90 Base) MCG/ACT inhaler Inhale 2 puffs into the lungs every 6 (six) hours as needed for wheezing or shortness of breath.   Yes [provider]  aspirin EC 81 MG tablet Take 81 mg by mouth daily. Swallow whole.   Yes [provider]  carvedilol (COREG) 3.125 MG tablet Take 1 tablet (3.125 mg total) by mouth 2 (two) times daily. 10/21/22  Yes [name], MD  citalopram (CELEXA) 20 MG tablet Take 1 tablet (20 mg total) by mouth at bedtime. 10/21/22  Yes [name], MD  cyclobenzaprine (FLEXERIL) 10 MG tablet Take 1 tablet (10 mg total) by mouth 3 (three) times daily as needed for muscle spasms. 10/21/22  Yes [name], MD  ezetimibe (ZETIA) 10 MG tablet Take 1 tablet (10 mg total) by mouth daily. 10/21/22  Yes [name], MD  fluticasone (FLONASE) 50 MCG/ACT nasal spray Place 2 sprays into both nostrils 2 (two) times daily. 10/21/22  Yes [name], MD  Fluticasone-Salmeterol (ADVAIR) 250-50 MCG/DOSE AEPB Inhale 1 puff into the lungs every 12 (twelve) hours.   Yes  [provider]  furosemide (LASIX) 20 MG tablet Take 2 tablets (40 mg total) by mouth daily. Patient taking differently: Take 20-40 mg by mouth as needed for fluid. 10/21/22  Yes [name], MD  gabapentin (NEURONTIN) 300 MG capsule Take 1 capsule (300 mg total) by mouth 3 (three) times daily. Take 300 mg by mouth 3 (three) times daily. 10/21/22  Yes [name], MD  HUMALOG KWIKPEN 100 UNIT/ML KwikPen Inject 5-30 Units into the skin 3 (three) times daily as needed (blood sugar of 200 or higher). Sliding scale 10/21/22  Yes [name], MD  isosorbide mononitrate (IMDUR) 60 MG 24 hr tablet Take one (1) tablet by mouth ( 60 mg ) in the am and one half tablet ( 30 mg) in the pm. 10/21/22  Yes [name], MD  Melatonin 10 MG TABS Take 10 mg by mouth at bedtime.   Yes [provider]  metFORMIN (GLUCOPHAGE) 1000 MG tablet Take 1 tablet (1,000 mg total) by mouth 2 (two) times daily. 10/21/22  Yes [name], MD  nitroGLYCERIN (NITROSTAT) 0.4 MG SL tablet Place 1 tablet (0.4 mg total) under the tongue every 5 (five) minutes x 3 doses as needed for chest pain (if no relief after 2nd dose, proceed to the ED for an evaluation or call 911). Place 0.4 mg under the tongue every 5 (five) minutes  x 3 doses as needed for chest pain (if no relief after 2nd dose, proceed to the ED for an evaluation or call 911). 10/21/22  Yes [name], MD  omeprazole (PRILOSEC) 40 MG capsule Take 1 capsule (40 mg total) by mouth daily. 10/21/22  Yes [name], MD  pravastatin (PRAVACHOL) 20 MG tablet Take 1 tablet (20 mg total) by mouth daily. 10/21/22  Yes [name], MD  tirzepatide [facility]) 2.5 MG/0.5ML Pen Inject 2.5 mg into the skin once a week. 11/13/22  Yes [name], MD  topiramate (TOPAMAX) 50 MG tablet Take 50 mg by mouth 2 (two) times daily. 11/20/22  Yes [provider]  TRESIBA FLEXTOUCH 100 UNIT/ML FlexTouch Pen Inject 65 Units into the skin  in the morning and at bedtime. Inject 60 units in the AM and 70 units in the PM. 10/21/22  Yes Dixon, Hazle Nordmann, MD  Continuous Blood Gluc Receiver (FREESTYLE LIBRE 2 READER) DEVI 1 Device by Does not apply route continuous. 10/21/22   [name], MD  Continuous Blood Gluc Sensor (FREESTYLE LIBRE 2 SENSOR) MISC USE 1 EACH FOR 14 DAYS 12/07/22   [name], MD      Allergies    Divalproex sodium, Statins, Tramadol, Valproic acid, Gadolinium derivatives, and Tricor [fenofibrate]    Review of Systems   Review of Systems  Cardiovascular:  Positive for chest pain.    Physical Exam   Vitals:   12/09/22 1615 12/09/22 1630  BP: 121/66 117/64  Pulse: 69 64  Resp: 13 11  Temp:    SpO2: 98% 97%    CONSTITUTIONAL:  well-appearing, NAD NEURO:  Alert and oriented x 3, CN 3-12 grossly intact EYES:  eyes equal and reactive ENT/NECK:  Supple, no stridor  CARDIO:  regular rate and rhythm, appears well-perfused, radial pulses 2+ bilaterally PULM:  No respiratory distress, CTAB GI/GU:  non-distended, soft MSK/SPINE:  No gross deformities, no edema, moves all extremities  SKIN:  no rash, atraumatic  *Additional and/or pertinent findings included in MDM below   ED Results / Procedures / Treatments   Labs (all labs ordered are listed, but only abnormal results are displayed) Labs Reviewed  BASIC METABOLIC PANEL - Abnormal; Notable for the following components:      Result Value   CO2 20 (*)    Glucose, Bld 137 (*)    Calcium 8.6 (*)    All other components within normal limits  CBC - Abnormal; Notable for the following components:   WBC 2.8 (*)    All other components within normal limits  TROPONIN I (HIGH SENSITIVITY)  TROPONIN I (HIGH SENSITIVITY)    EKG EKG Interpretation  Date/Time:  Wednesday December 09 2022 13:10:07 EST Ventricular Rate:  75 PR Interval:  132 QRS Duration: 92 QT Interval:  390 QTC Calculation: 435 R Axis:   80 Text Interpretation: Normal sinus  rhythm T wave abnormality, consider inferior ischemia Abnormal ECG When compared with ECG of 30-Jul-2022 14:59, PREVIOUS ECG IS PRESENT Confirmed by Fredia Sorrow [phone]) on 12/09/2022 5:24:03 PM  Radiology DG Chest 2 View  Result Date: 12/09/2022 CLINICAL DATA:  Chest pain with intermittent chest tightness and shortness of breath for several days. EXAM: CHEST - 2 VIEW COMPARISON:  Radiographs 08/14/2022 and 09/03/2021.  CT 12/12/2021. FINDINGS: The heart size and mediastinal contours appear stable status post median sternotomy and CABG. The lungs are clear. There is no pleural effusion or pneumothorax. No acute osseous findings are evident.  Degenerative changes are present in the spine. IMPRESSION: Stable chest. No acute cardiopulmonary process. Previous CABG. Electronically Signed   By: Richardean Sale M.D.   On: 12/09/2022 13:28    Procedures Procedures    Medications Ordered in ED Medications - No data to display  ED Course/ Medical Decision Making/ A&P                             Medical Decision Making Amount and/or Complexity of Data Reviewed Labs: ordered. Radiology: ordered.   Initial Impression and Ddx 67 year old male who is well-appearing and hemodynamically stable presenting with chest pain.  Exam unremarkable.  Differential diagnosis was complaint includes ACS, PE, pneumonia, pneumothorax, and CHF. Patient PMH that increases complexity of ED encounter: CAD status post 3 CABGs, diabetes and high cholesterol  Interpretation of Diagnostics I independent reviewed and interpreted the labs as followed: No acute derangement  - I independently visualized the following imaging with scope of interpretation limited to determining acute life threatening conditions related to emergency care: Chest x-ray, which revealed no acute cardiopulmonary process  Independently reviewed and interpreted EKG which revealed normal sinus rhythm and grossly unchanged from prior  Patient  Reassessment and Ultimate Disposition/Management Given patient's extensive heart history, initially concern for ACS.  Fortunately workup was overall unremarkable.  Upon reevaluation patient stated that his chest pain had gone away and that he was no longer short of breath.  Discussed that he was appropriate to be discharged given his reassuring workup and stable vitals.  Strongly advised him to follow-up with his cardiologist, Dr. Domenic Polite in the next 2 to 3 days.  Discussed return precautions.  Patient management required discussion with the following services or consulting groups:  none  Complexity of Problems Addressed Acute complicated illness or Injury  Additional Data Reviewed and Analyzed Further history obtained from: Past medical history and medications listed in the EMR, Prior ED visit notes, and Recent Consult notes  Patient Encounter Risk Assessment None         Final Clinical Impression(s) / ED Diagnoses Final diagnoses:  Chest pain, unspecified type    Rx / DC Orders ED Discharge Orders     None         Harriet Pho, PA-C 12/09/22 1728    Fredia Sorrow, MD 12/10/22 [phone]

## 2022-12-09 NOTE — ED Triage Notes (Signed)
Pt reports intermittent chest tightness with some SHOB for the past several days but has remained steady today.

## 2022-12-09 NOTE — Discharge Instructions (Signed)
Evaluation for your chest pain was overall reassuring.  EKG chest x-ray and cardiac enzymes were all within normal limits.  Given your extensive heart history however I do recommend that you follow-up with your cardiologist, Dr. Domenic Polite in the next 2 to 3 days.  I will give him a call tomorrow to let him know that you were evaluated today.  If you have worsening chest pain, shortness of breath, or any other concerning symptom please return to the emergency department further evaluation.

## 2022-12-11 ENCOUNTER — Encounter: Payer: Self-pay | Admitting: Internal Medicine

## 2022-12-11 ENCOUNTER — Ambulatory Visit: Payer: 59 | Attending: Cardiology | Admitting: Cardiology

## 2022-12-11 ENCOUNTER — Ambulatory Visit (INDEPENDENT_AMBULATORY_CARE_PROVIDER_SITE_OTHER): Payer: 59 | Admitting: Internal Medicine

## 2022-12-11 ENCOUNTER — Encounter: Payer: Self-pay | Admitting: Cardiology

## 2022-12-11 VITALS — BP 102/63 | HR 72 | Ht 69.0 in | Wt 219.6 lb

## 2022-12-11 VITALS — BP 110/56 | HR 70 | Ht 69.0 in | Wt 221.0 lb

## 2022-12-11 DIAGNOSIS — E782 Mixed hyperlipidemia: Secondary | ICD-10-CM | POA: Diagnosis not present

## 2022-12-11 DIAGNOSIS — E1100 Type 2 diabetes mellitus with hyperosmolarity without nonketotic hyperglycemic-hyperosmolar coma (NKHHC): Secondary | ICD-10-CM

## 2022-12-11 DIAGNOSIS — R809 Proteinuria, unspecified: Secondary | ICD-10-CM | POA: Diagnosis not present

## 2022-12-11 DIAGNOSIS — R0602 Shortness of breath: Secondary | ICD-10-CM | POA: Diagnosis not present

## 2022-12-11 DIAGNOSIS — Z794 Long term (current) use of insulin: Secondary | ICD-10-CM

## 2022-12-11 DIAGNOSIS — R0600 Dyspnea, unspecified: Secondary | ICD-10-CM | POA: Diagnosis not present

## 2022-12-11 DIAGNOSIS — I10 Essential (primary) hypertension: Secondary | ICD-10-CM | POA: Diagnosis not present

## 2022-12-11 DIAGNOSIS — E1129 Type 2 diabetes mellitus with other diabetic kidney complication: Secondary | ICD-10-CM | POA: Diagnosis not present

## 2022-12-11 DIAGNOSIS — I25119 Atherosclerotic heart disease of native coronary artery with unspecified angina pectoris: Secondary | ICD-10-CM | POA: Diagnosis not present

## 2022-12-11 DIAGNOSIS — R42 Dizziness and giddiness: Secondary | ICD-10-CM

## 2022-12-11 MED ORDER — RANOLAZINE ER 500 MG PO TB12: 500.0000 mg | ORAL_TABLET | Freq: Two times a day (BID) | ORAL | 3 refills | Status: DC

## 2022-12-11 MED ORDER — FREESTYLE LIBRE 2 SENSOR MISC: 3 refills | Status: DC

## 2022-12-11 MED ORDER — DAPAGLIFLOZIN PROPANEDIOL 5 MG PO TABS: 5.0000 mg | ORAL_TABLET | Freq: Every day | ORAL | 2 refills | Status: DC

## 2022-12-11 MED ORDER — TIRZEPATIDE 5 MG/0.5ML ~~LOC~~ SOAJ: 5.0000 mg | SUBCUTANEOUS | 1 refills | Status: DC

## 2022-12-11 NOTE — Patient Instructions (Signed)
Medication Instructions:  Stop Lasix  START Ranexa 500 mg twice a day   Labwork: None today  Testing/Procedures: Your physician has requested that you have an echocardiogram. Echocardiography is a painless test that uses sound waves to create images of your heart. It provides your doctor with information about the size and shape of your heart and how well your heart's chambers and valves are working. This procedure takes approximately one hour. There are no restrictions for this procedure. Please do NOT wear cologne, perfume, aftershave, or lotions (deodorant is allowed). Please arrive 15 minutes prior to your appointment time.   Follow-Up: 01/04/23 at 2:33 pm with Cadence Kathlen Mody, PA-C  Any Other Special Instructions Will Be Listed Below (If Applicable).  If you need a refill on your cardiac medications before your next appointment, please call your pharmacy.

## 2022-12-11 NOTE — Progress Notes (Unsigned)
Established Patient Office Visit  Subjective   Patient ID: [name] [name], male    DOB: 07/03/1956  Age: 67 y.o. MRN: [id number]  Chief Complaint  Patient presents with   Diabetes    Follow up   [name] returns to care today for diabetes follow-up.  He was last seen by me 1/5 at which time [name] 2.5 mg weekly was added.  4-week follow-up was arranged.  In the interim, [name] presented to the emergency department on 1/31 endorsing a 2-day history of chest pain.  His workup was overall unremarkable and his chest pain resolved while in the ED.  He was discharged home with close cardiology follow-up.  He has follow-up appoint with cardiology scheduled for this afternoon (2/2).  Today [name] reports feeling well.  He has not had any recurrence of chest pain since leaving the emergency department.  He has been taking [name] as prescribed and has noticed a reduction in his blood sugar readings.  He has not needed to take Humalog during the day but remains on Tresiba 60 units every morning and 7 units each night.  Past Medical History:  Diagnosis Date   Anxiety    Arthritis    Asthma    Cataract    Right eye   Chronic lower back pain    Cirrhosis of liver (HCC)    Coronary atherosclerosis of native coronary artery    a. s/p multiple prior stents, s/p CABG in 01/2016 with LIMA-LAD, Free RIMA-OM2 and SVG-PDA b. 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   LIMA to LAD, free RIMA to OM2, SVG to PDA, open SVG harvest from right thigh   Stroke ([place])    Thrombocytopenia (HCC)    TIA (transient ischemic attack)    Type 2 diabetes mellitus ([place])    Past Surgical  History:  Procedure Laterality Date   ANTERIOR CERVICAL DECOMP/DISCECTOMY FUSION  1998   "C3-4"   CARDIAC CATHETERIZATION  "several"   CARDIAC CATHETERIZATION N/A 12/30/2015   Procedure: Left Heart Cath and Coronary Angiography;  Surgeon: [name], MD;  Location: [facility];  Service: Cardiovascular;  Laterality: N/A;   CARPAL TUNNEL RELEASE Bilateral 2005   CHOLECYSTECTOMY N/A 11/28/2018   Procedure: LAPAROSCOPIC CHOLECYSTECTOMY;  Surgeon: [name], MD;  Location: [facility];  Service: General;  Laterality: N/A;   COLONOSCOPY     COLONOSCOPY WITH PROPOFOL N/A 09/27/2020   Procedure: COLONOSCOPY WITH PROPOFOL;  Surgeon: [name], MD;  Location: [facility];  Service: Gastroenterology;  Laterality: N/A;   CORONARY ANGIOPLASTY     CORONARY ANGIOPLASTY WITH STENT PLACEMENT  2002; 2003; 11/20/2014   "I have 4 stents after today" (11/20/2014)   CORONARY ARTERY BYPASS GRAFT N/A 01/09/2016   Procedure: CORONARY ARTERY BYPASS GRAFTING (CABG) X 3 UTILIZING RIGHT AND LEFT INTERNAL MAMMARY ARTERY AND ENDOSCOPICALLY HARVESTED SAPHENEOUS VEIN.;  Surgeon: Rexene Alberts, MD;  Location: [facility];  Service: Open Heart Surgery;  Laterality: N/A;   ESOPHAGOGASTRODUODENOSCOPY     ESOPHAGOGASTRODUODENOSCOPY (EGD) WITH PROPOFOL  N/A 09/27/2020   Procedure: ESOPHAGOGASTRODUODENOSCOPY (EGD) WITH PROPOFOL;  Surgeon: Dolores Frame, MD;  [facility]: [facility];  Service: Gastroenterology;  Laterality: N/A;  10   KNEE SURGERY Left 02/2012   "scraped; open"   LEFT HEART CATH AND CORS/GRAFTS ANGIOGRAPHY N/A 08/09/2017   Procedure: LEFT HEART CATH AND CORS/GRAFTS ANGIOGRAPHY;  Surgeon: Yvonne Kendall, MD;  [facility];  Service: Cardiovascular;  Laterality: N/A;   LEFT HEART CATH AND CORS/GRAFTS ANGIOGRAPHY N/A 11/22/2018   Procedure: LEFT HEART CATH AND CORS/GRAFTS ANGIOGRAPHY;  Surgeon: Swaziland, [name], MD;  Location: [facility];  Service:  Cardiovascular;  Laterality: N/A;   LEFT HEART CATH AND CORS/GRAFTS ANGIOGRAPHY N/A 08/20/2022   Procedure: LEFT HEART CATH AND CORS/GRAFTS ANGIOGRAPHY;  Surgeon: Kathleene Hazel, MD;  Location: [facility];  Service: Cardiovascular;  Laterality: N/A;   LEFT HEART CATHETERIZATION WITH CORONARY ANGIOGRAM N/A 07/20/2012   Procedure: LEFT HEART CATHETERIZATION WITH CORONARY ANGIOGRAM;  Surgeon: Iran Ouch, MD;  Location: [facility];  Service: Cardiovascular;  Laterality: N/A;   LEFT HEART CATHETERIZATION WITH CORONARY ANGIOGRAM N/A 11/20/2014   Procedure: LEFT HEART CATHETERIZATION WITH CORONARY ANGIOGRAM;  Surgeon: [name] Swaziland, MD;  Location: [facility] CATH LAB;  Service: Cardiovascular;  Laterality: N/A;   LEFT HEART CATHETERIZATION WITH CORONARY ANGIOGRAM N/A 11/26/2014   Procedure: LEFT HEART CATHETERIZATION WITH CORONARY ANGIOGRAM;  Surgeon: [name] Swaziland, MD;  Location: [facility] CATH LAB;  Service: Cardiovascular;  Laterality: N/A;   NEUROPLASTY / TRANSPOSITION ULNAR NERVE AT ELBOW Right ~ 2012   PERCUTANEOUS CORONARY ROTOBLATOR INTERVENTION (PCI-R)  11/20/2014   Procedure: PERCUTANEOUS CORONARY ROTOBLATOR INTERVENTION (PCI-R);  Surgeon: [name] Swaziland, MD;  Location: [facility] CATH LAB;  Service: Cardiovascular;;   POSTERIOR CERVICAL LAMINECTOMY Left 04/16/2022   Procedure: Laminectomy and Foraminotomy - left - C6-C7;  Surgeon: Julio Sicks, MD;  Location: [facility];  Service: Neurosurgery;  Laterality: Left;  3C   SHOULDER ARTHROSCOPY Left ~ 2011   TEE WITHOUT CARDIOVERSION N/A 01/09/2016   Procedure: TRANSESOPHAGEAL ECHOCARDIOGRAM (TEE);  Surgeon: Purcell Nails, MD;  Location: [facility] OR;  Service: Open Heart Surgery;  Laterality: N/A;   Social History   Tobacco Use   Smoking status: Never   Smokeless tobacco: Never  Vaping Use   Vaping Use: Never used  Substance Use Topics   Alcohol use: No   Drug use: No   Family History  Problem Relation Age of Onset   Stroke Mother    Coronary artery  disease Father 47   Asthma Sister    Multiple sclerosis Brother    Heart disease Paternal Uncle    Stroke Maternal Grandmother    Heart attack Paternal Grandmother    Cancer Paternal Grandfather    Asthma Sister    Seizures Son    Migraines Son    Autism Son    Migraines Son    Allergies  Allergen Reactions   Divalproex Sodium Other (See Comments)    Causes anger   Statins Other (See Comments)    Muscle aches and cramps   Tramadol Other (See Comments)    Chest pain    Valproic Acid Other (See Comments)    Causes anger   Gadolinium Derivatives Nausea And Vomiting    07/25/19 Pt vomited immediately after IV gad. Denies itching, dyspnea.  (Adverse, not allergic, reaction   Tricor [Fenofibrate] Other (See Comments)    Leg cramps   Review of Systems  Constitutional:  Negative for  chills and fever.  HENT:  Negative for sore throat.   Respiratory:  Negative for cough and shortness of breath.   Cardiovascular:  Negative for chest pain, palpitations and leg swelling.  Gastrointestinal:  Negative for abdominal pain, blood in stool, constipation, diarrhea, nausea and vomiting.  Genitourinary:  Negative for dysuria and hematuria.  Musculoskeletal:  Negative for myalgias.  Skin:  Negative for itching and rash.  Neurological:  Negative for dizziness and headaches.  Psychiatric/Behavioral:  Negative for depression and suicidal ideas.      Objective:     BP 102/63   Pulse 72   Ht 5\' 9"  (1.753 m)   Wt 219 lb 9.6 oz (99.6 kg)   SpO2 96%   BMI 32.43 kg/m  BP Readings from Last 3 Encounters:  12/11/22 (!) 110/56  12/11/22 102/63  12/09/22 128/75   Physical Exam Vitals reviewed.  Constitutional:      General: He is not in acute distress.    Appearance: Normal appearance. He is obese. He is not ill-appearing.  HENT:     Head: Normocephalic and atraumatic.     Nose: Nose normal. No congestion or rhinorrhea.     Mouth/Throat:     Mouth: Mucous membranes are moist.      Pharynx: Oropharynx is clear.  Eyes:     Extraocular Movements: Extraocular movements intact.     Conjunctiva/sclera: Conjunctivae normal.     Pupils: Pupils are equal, round, and reactive to light.  Cardiovascular:     Rate and Rhythm: Normal rate and regular rhythm.     Pulses: Normal pulses.     Heart sounds: Murmur heard.  Pulmonary:     Effort: Pulmonary effort is normal.     Breath sounds: Normal breath sounds. No wheezing, rhonchi or rales.  Abdominal:     General: Abdomen is flat. Bowel sounds are normal. There is no distension.     Palpations: Abdomen is soft.     Tenderness: There is no abdominal tenderness.  Musculoskeletal:        General: No swelling or deformity. Normal range of motion.     Cervical back: Normal range of motion.  Skin:    General: Skin is warm and dry.     Capillary Refill: Capillary refill takes less than 2 seconds.  Neurological:     General: No focal deficit present.     Mental Status: He is alert and oriented to person, place, and time.     Motor: No weakness.  Psychiatric:        Mood and Affect: Mood normal.        Behavior: Behavior normal.        Thought Content: Thought content normal.   Last CBC Lab Results  Component Value Date   WBC 2.8 (L) 12/09/2022   HGB 14.8 12/09/2022   HCT 41.9 12/09/2022   MCV 88.8 12/09/2022   MCH 31.4 12/09/2022   RDW 13.6 12/09/2022   PLT DCLMP 75/08/2584   Last metabolic panel Lab Results  Component Value Date   GLUCOSE 137 (H) 12/09/2022   NA 138 12/09/2022   K 3.5 12/09/2022   CL 110 12/09/2022   CO2 20 (L) 12/09/2022   BUN 14 12/09/2022   CREATININE 1.02 12/09/2022   GFRNONAA >60 12/09/2022   CALCIUM 8.6 (L) 12/09/2022   PROT 7.2 09/29/2022   ALBUMIN 3.7 (L) 09/29/2022   LABGLOB 3.5 09/29/2022   AGRATIO 1.1 (L) 09/29/2022   BILITOT 0.4 09/29/2022   ALKPHOS  63 09/29/2022   AST 37 09/29/2022   ALT 29 09/29/2022   ANIONGAP 8 12/09/2022   Last lipids Lab Results  Component Value  Date   CHOL 108 09/29/2022   HDL 32 (L) 09/29/2022   LDLCALC 55 09/29/2022   TRIG 112 09/29/2022   CHOLHDL 3.4 09/29/2022   Last hemoglobin A1c Lab Results  Component Value Date   HGBA1C 7.8 (H) 09/29/2022   Last thyroid functions Lab Results  Component Value Date   TSH 1.590 09/29/2022   Last vitamin D Lab Results  Component Value Date   VD25OH 25.8 (L) 09/29/2022   Last vitamin B12 and Folate Lab Results  Component Value Date   VXBLTJQZ00 923 09/29/2022   FOLATE 12.7 09/29/2022     Assessment & Plan:   Problem List Items Addressed This Visit       Type 2 diabetes mellitus ([place])    Presenting today for diabetes follow-up.  Last A1c 7.8 in November.  [name] 2.5 mg weekly was started at his last appointment.  He is additionally prescribed metformin 1000 mg twice daily, Tresiba 60 and 70 morning and 70 units nightly, and Humalog sliding scale with meals.  He reports today that he has not needed to use Humalog with meals recently because his blood sugar readings have been in the "green zone". -Discontinue Humalog -Increase [name] to 5 mg weekly -Add Farxiga 5 mg daily in the setting of microalbuminuria -I reviewed with Mr. Mulford that he should be able to reduce his [name] requirements as [name] was increased.  He was instructed to reduce Tresiba by 4 units every 2-3 days for blood sugar readings < 90. -We will follow-up in 2 months for reassessment and repeat A1c at that time      Microalbuminuria due to type 2 diabetes mellitus (HCC) - Primary    Moderate microalbuminuria noted on urine study from last month. -Start Farxiga 5 mg daily       Return in about 2 months (around 02/09/2023) for DM.    [name], MD

## 2022-12-11 NOTE — Progress Notes (Signed)
Cardiology Clinic Note   Patient Name: [name] [name] Date of Encounter: 12/11/2022  Primary Care Provider:  [name], MD Primary Cardiologist:  Rozann Lesches, MD  Patient Profile    67 year old male with past medical history of coronary artery disease, mixed hyperlipidemia, essential hypertension, diastolic congestive heart failure, TIA/CVA, obstructive sleep apnea, GERD, type 2 diabetes, who is here today for follow-up after recent hospital visit.  Past Medical History    Past Medical History:  Diagnosis Date   Anxiety    Arthritis    Asthma    Cataract    Right eye   Chronic lower back pain    Cirrhosis of liver (HCC)    Coronary atherosclerosis of native coronary artery    a. s/p multiple prior stents, s/p CABG in 01/2016 with LIMA-LAD, Free RIMA-OM2 and SVG-PDA b. 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   LIMA to LAD, free RIMA to OM2, SVG to PDA, open SVG harvest from right thigh   Stroke ([place])    Thrombocytopenia (HCC)    TIA (transient ischemic attack)    Type 2 diabetes mellitus ([place])    Past Surgical History:  Procedure Laterality Date   ANTERIOR CERVICAL DECOMP/DISCECTOMY FUSION  1998   "C3-4"   CARDIAC CATHETERIZATION  "several"   CARDIAC CATHETERIZATION N/A 12/30/2015   Procedure: Left Heart Cath and Coronary Angiography;  Surgeon: [name], MD;  Location: [place] [facility];  Service: Cardiovascular;  Laterality: N/A;   CARPAL TUNNEL RELEASE Bilateral 2005   CHOLECYSTECTOMY N/A 11/28/2018   Procedure: LAPAROSCOPIC CHOLECYSTECTOMY;  Surgeon: [name], MD;  Location: [facility];  Service: General;  Laterality:  N/A;   COLONOSCOPY     COLONOSCOPY WITH PROPOFOL N/A 09/27/2020   Procedure: COLONOSCOPY WITH PROPOFOL;  Surgeon: [name], MD;  Location: [facility];  Service: Gastroenterology;  Laterality: N/A;   CORONARY ANGIOPLASTY     CORONARY ANGIOPLASTY WITH STENT PLACEMENT  2002; 2003; 11/20/2014   "I have 4 stents after today" (11/20/2014)   CORONARY ARTERY BYPASS GRAFT N/A 01/09/2016   Procedure: CORONARY ARTERY BYPASS GRAFTING (CABG) X 3 UTILIZING RIGHT AND LEFT INTERNAL MAMMARY ARTERY AND ENDOSCOPICALLY HARVESTED SAPHENEOUS VEIN.;  Surgeon: Rexene Alberts, MD;  Location: [facility];  Service: Open Heart Surgery;  Laterality: N/A;   ESOPHAGOGASTRODUODENOSCOPY     ESOPHAGOGASTRODUODENOSCOPY (EGD) WITH PROPOFOL N/A 09/27/2020   Procedure: ESOPHAGOGASTRODUODENOSCOPY (EGD) WITH PROPOFOL;  Surgeon: [name], MD;  [facility]: [facility];  Service: Gastroenterology;  Laterality: N/A;  10   KNEE SURGERY Left 02/2012   "scraped; open"   LEFT HEART CATH AND CORS/GRAFTS ANGIOGRAPHY N/A 08/09/2017   Procedure: LEFT HEART CATH AND CORS/GRAFTS ANGIOGRAPHY;  Surgeon: [name], MD;  Location: [facility];  Service: Cardiovascular;  Laterality: N/A;   LEFT HEART CATH AND CORS/GRAFTS ANGIOGRAPHY N/A 11/22/2018   Procedure: LEFT HEART CATH AND CORS/GRAFTS ANGIOGRAPHY;  Surgeon: [name], [name], MD;  Location: [place];  Service: Cardiovascular;  Laterality: N/A;   LEFT  HEART CATH AND CORS/GRAFTS ANGIOGRAPHY N/A 08/20/2022   Procedure: LEFT HEART CATH AND CORS/GRAFTS ANGIOGRAPHY;  Surgeon: Burnell Blanks, MD;  Location: [place];  Service: Cardiovascular;  Laterality: N/A;   LEFT HEART CATHETERIZATION WITH CORONARY ANGIOGRAM N/A 07/20/2012   Procedure: LEFT HEART CATHETERIZATION WITH CORONARY ANGIOGRAM;  Surgeon: [name], MD;  Location: [facility] [facility];  Service: Cardiovascular;  Laterality: N/A;   LEFT HEART CATHETERIZATION WITH CORONARY  ANGIOGRAM N/A 11/20/2014   Procedure: LEFT HEART CATHETERIZATION WITH CORONARY ANGIOGRAM;  Surgeon: [name], MD;  Location: [facility];  Service: Cardiovascular;  Laterality: N/A;   LEFT HEART CATHETERIZATION WITH CORONARY ANGIOGRAM N/A 11/26/2014   Procedure: LEFT HEART CATHETERIZATION WITH CORONARY ANGIOGRAM;  Surgeon: [name], MD;  Location: [facility];  Service: Cardiovascular;  Laterality: N/A;   NEUROPLASTY / TRANSPOSITION ULNAR NERVE AT ELBOW Right ~ 2012   PERCUTANEOUS CORONARY ROTOBLATOR INTERVENTION (PCI-R)  11/20/2014   Procedure: PERCUTANEOUS CORONARY ROTOBLATOR INTERVENTION (PCI-R);  Surgeon: [name], MD;  Location: [facility] C[facility] LAB;  Service: Cardiovascular;;   POSTERIOR CERVICAL LAMINECTOMY Left 04/16/2022   Procedure: Laminectomy and Foraminotomy - left - C6-C7;  Surgeon: Earnie Larsson, MD;  Location: [place];  Service: Neurosurgery;  Laterality: Left;  3C   SHOULDER ARTHROSCOPY Left ~ 2011   TEE WITHOUT CARDIOVERSION N/A 01/09/2016   Procedure: TRANSESOPHAGEAL ECHOCARDIOGRAM (TEE);  Surgeon: Rexene Alberts, MD;  Location: [place];  Service: Open Heart Surgery;  Laterality: N/A;    Allergies  Allergies  Allergen Reactions   Divalproex Sodium Other (See Comments)    Causes anger   Statins Other (See Comments)    Muscle aches and cramps   Tramadol Other (See Comments)    Chest pain    Valproic Acid Other (See Comments)    Causes anger   Gadolinium Derivatives Nausea And Vomiting    07/25/19 Pt vomited immediately after IV gad. Denies itching, dyspnea.  (Adverse, not allergic, reaction   Tricor [Fenofibrate] Other (See Comments)    Leg cramps    History of Present Illness    [name] [name] is a 67 year old male with previously mentioned complex past medical history of coronary artery disease status post multiple prior stents and CABG x 3 in 01/2016 with LIMA LAD, RIMA-OM 2, and SVG-PDA, cath in 11/2018 showed patent grafts, cath in 08/2022 revealing patent grafts,  hypertension, hyperlipidemia, type 2 diabetes, and prior TIA.  Most recent left heart catheterization was done 08/20/2022 which revealed severe triple-vessel CAD status post three-vessel CABG with 3 out of 3 patent bypass grafts, chronic occlusion mid LAD, mid and distal LAD fills from the patent LIMA graft, chronic occluded obtuse marginal branch of the previously stented segment, patent free RIMA graft to the obtuse marginal branch, large dominant RCA with mild diffuse disease, PDA is chronically occluded, patent SVG to PDA.  Recommendations were to continue with medical management of CAD.  He was recently evaluated at [facility] on 12/09/2021 with complaint of chest pain.  He stated that the pain was located in the middle of his chest and radiating into his left neck and down to his left shoulder and it started 2 days prior.  It feels like pressure.  He endorsed associated shortness of breath and diaphoreses.  The chest pain was worse with exertion he had no chest pain at rest.  He had not tried any of his sublingual nitro.  Vital signs were stable.  Workup was overall unremarkable.  Upon reevaluation the patient stated that his chest pain is gone away and that he was no longer short of breath.  He was subsequently discharged with cardiology follow-up.  He returns to clinic today accompanied by his wife.  He states that he is continuing to have chest pressure, shortness of breath with dyspnea on exertion and associated dizziness.  He describes the chest pressure is substernal it does radiate into his left jaw and into his left shoulder and it is only on exertion.  He states he has not taken any of his sublingual nitro as he does not like the headache that he gets is a side effect from that medication.  He states that he has been unable to walk his dog is with the exertion he has discomfort.  Denies any palpitations, peripheral edema, or syncopal/near syncopal episodes.  Home Medications    Current Outpatient  Medications  Medication Sig Dispense Refill   albuterol (VENTOLIN HFA) 108 (90 Base) MCG/ACT inhaler Inhale 2 puffs into the lungs every 6 (six) hours as needed for wheezing or shortness of breath.     aspirin EC 81 MG tablet Take 81 mg by mouth daily. Swallow whole.     carvedilol (COREG) 3.125 MG tablet Take 1 tablet (3.125 mg total) by mouth 2 (two) times daily. 60 tablet 3   citalopram (CELEXA) 20 MG tablet Take 1 tablet (20 mg total) by mouth at bedtime. 90 tablet 0   Continuous Blood Gluc Receiver (FREESTYLE LIBRE 2 READER) DEVI 1 Device by Does not apply route continuous. 1 each 0   Continuous Blood Gluc Sensor (FREESTYLE LIBRE 2 SENSOR) MISC Apply one sensor every 14 days 6 each 3   cyclobenzaprine (FLEXERIL) 10 MG tablet Take 1 tablet (10 mg total) by mouth 3 (three) times daily as needed for muscle spasms. 30 tablet 0   dapagliflozin propanediol (FARXIGA) 5 MG TABS tablet Take 1 tablet (5 mg total) by mouth daily before breakfast. 30 tablet 2   ezetimibe (ZETIA) 10 MG tablet Take 1 tablet (10 mg total) by mouth daily. 90 tablet 0   fluticasone (FLONASE) 50 MCG/ACT nasal spray Place 2 sprays into both nostrils 2 (two) times daily. 15.8 mL 3   Fluticasone-Salmeterol (ADVAIR) 250-50 MCG/DOSE AEPB Inhale 1 puff into the lungs every 12 (twelve) hours.     gabapentin (NEURONTIN) 300 MG capsule Take 1 capsule (300 mg total) by mouth 3 (three) times daily. Take 300 mg by mouth 3 (three) times daily. 270 capsule 0   HUMALOG KWIKPEN 100 UNIT/ML KwikPen Inject 5-30 Units into the skin 3 (three) times daily as needed (blood sugar of 200 or higher). Sliding scale 15 mL 3   isosorbide mononitrate (IMDUR) 60 MG 24 hr tablet Take one (1) tablet by mouth ( 60 mg ) in the am and one half tablet ( 30 mg) in the pm. 90 tablet 1   Melatonin 10 MG TABS Take 10 mg by mouth at bedtime.     metFORMIN (GLUCOPHAGE) 1000 MG tablet Take 1 tablet (1,000 mg total) by mouth 2 (two) times daily. 60 tablet 11    nitroGLYCERIN (NITROSTAT) 0.4 MG SL tablet Place 1 tablet (0.4 mg total) under the tongue every 5 (five) minutes x 3 doses as needed for chest pain (if no relief after 2nd dose, proceed to the ED for an evaluation or call 911). Place 0.4 mg under the tongue every 5 (five) minutes x 3 doses as needed for chest pain (if no relief  after 2nd dose, proceed to the ED for an evaluation or call 911). 30 tablet 0   omeprazole (PRILOSEC) 40 MG capsule Take 1 capsule (40 mg total) by mouth daily. 30 capsule 3   pravastatin (PRAVACHOL) 20 MG tablet Take 1 tablet (20 mg total) by mouth daily. 90 tablet 0   ranolazine (RANEXA) 500 MG 12 hr tablet Take 1 tablet (500 mg total) by mouth 2 (two) times daily. 180 tablet 3   tirzepatide ([name]) 5 MG/0.5ML Pen Inject 5 mg into the skin once a week. 6 mL 1   topiramate (TOPAMAX) 50 MG tablet Take 50 mg by mouth 2 (two) times daily.     TRESIBA FLEXTOUCH 100 UNIT/ML FlexTouch Pen Inject 65 Units into the skin in the morning and at bedtime. Inject 60 units in the AM and 70 units in the PM.     Current Facility-Administered Medications  Medication Dose Route Frequency Provider Last Rate Last Admin   sodium chloride flush (NS) 0.9 % injection 3 mL  3 mL Intravenous Q12H Barbarann Ehlers Junius Creamer., NP         Family History    Family History  Problem Relation Age of Onset   Stroke Mother    Coronary artery disease Father 45   Asthma Sister    Multiple sclerosis Brother    Heart disease Paternal Uncle    Stroke Maternal Grandmother    Heart attack Paternal Grandmother    Cancer Paternal Grandfather    Asthma Sister    Seizures Son    Migraines Son    Autism Son    Migraines Son    He indicated that his mother is alive. He indicated that his father is alive. He indicated that both of his sisters are alive. He indicated that his brother is alive. He indicated that the status of his maternal grandmother is unknown. He indicated that the status of his paternal grandmother  is unknown. He indicated that the status of his paternal grandfather is unknown and reported the following: Throat. He indicated that all of his three sons are alive. He indicated that the status of his paternal uncle is unknown.  Social History    Social History   Socioeconomic History   Marital status: Married    Spouse name: [name]   Number of children: 3   Years of education: 12 th    Highest education level: Not on file  Occupational History   Occupation: Unemployed  Tobacco Use   Smoking status: Never   Smokeless tobacco: Never  Vaping Use   Vaping Use: Never used  Substance and Sexual Activity   Alcohol use: No   Drug use: No   Sexual activity: Not on file  Other Topics Concern   Not on file  Social History Narrative   Patient lives at home with wife [name].   Patient works at [employer], Engineer, petroleum.   Patient has a 12 th grade education.    Patient has 3 children.       Social Determinants of Health   Financial Resource Strain: Low Risk  (11/23/2022)   Overall Financial Resource Strain (CARDIA)    Difficulty of Paying Living Expenses: Not hard at all  Food Insecurity: No Food Insecurity (11/23/2022)   Hunger Vital Sign    Worried About Running Out of Food in the Last Year: Never true    Ran Out of Food in the Last Year: Never true  Transportation Needs: No Transportation Needs (  11/23/2022)   PRAPARE - Administrator, Civil Service (Medical): No    Lack of Transportation (Non-Medical): No  Physical Activity: Sufficiently Active (11/23/2022)   Exercise Vital Sign    Days of Exercise per Week: 7 days    Minutes of Exercise per Session: 30 min  Stress: No Stress Concern Present (11/23/2022)   [employer] of Occupational Health - Occupational Stress Questionnaire    Feeling of Stress : Not at all  Social Connections: Socially Integrated (02/25/2021)   Social Connection and Isolation Panel [NHANES]    Frequency of Communication with  Friends and Family: More than three times a week    Frequency of Social Gatherings with Friends and Family: More than three times a week    Attends Religious Services: More than 4 times per year    Active Member of [employer] or Organizations: Yes    Attends Engineer, structural: More than 4 times per year    Marital Status: Married  Catering manager Violence: Not At Risk (11/23/2022)   Humiliation, Afraid, Rape, and Kick questionnaire    Fear of Current or Ex-Partner: No    Emotionally Abused: No    Physically Abused: No    Sexually Abused: No     Review of Systems    General:  No chills, fever, night sweats or weight changes.  Endorses fatigue Cardiovascular: Endorses chest pain, dyspnea on exertion, but denies edema, orthopnea, palpitations, paroxysmal nocturnal dyspnea. Dermatological: No rash, lesions/masses Respiratory: No cough, endorses dyspnea Urologic: No hematuria, dysuria Abdominal:   No nausea, vomiting, diarrhea, bright red blood per rectum, melena, or hematemesis Neurologic:  No visual changes, wkns, changes in mental status.  Endorses dizziness All other systems reviewed and are otherwise negative except as noted above.   Physical Exam    VS:  BP (!) 110/56   Pulse 70   Ht 5\' 9"  (1.753 m)   Wt 221 lb (100.2 kg)   SpO2 97%   BMI 32.64 kg/m  , BMI Body mass index is 32.64 kg/m.     GEN: Well nourished, well developed, in no acute distress. HEENT: normal. Neck: Supple, no JVD, carotid bruits, or masses. Cardiac: RRR, no murmurs, rubs, or gallops. No clubbing, cyanosis, edema.  Radials 2+/PT 2+ and equal bilaterally.  Respiratory:  Respirations regular and unlabored, clear to auscultation bilaterally. GI: Soft, nontender, nondistended, BS + x 4. MS: no deformity or atrophy. Skin: warm and dry, no rash. Neuro:  Strength and sensation are intact. Psych: Normal affect.  Accessory Clinical Findings    ECG personally reviewed by me today-no new tracings  completed today  Lab Results  Component Value Date   WBC 2.8 (L) 12/09/2022   HGB 14.8 12/09/2022   HCT 41.9 12/09/2022   MCV 88.8 12/09/2022   PLT DCLMP 12/09/2022   Lab Results  Component Value Date   CREATININE 1.02 12/09/2022   BUN 14 12/09/2022   NA 138 12/09/2022   K 3.5 12/09/2022   CL 110 12/09/2022   CO2 20 (L) 12/09/2022   Lab Results  Component Value Date   ALT 29 09/29/2022   AST 37 09/29/2022   ALKPHOS 63 09/29/2022   BILITOT 0.4 09/29/2022   Lab Results  Component Value Date   CHOL 108 09/29/2022   HDL 32 (L) 09/29/2022   LDLCALC 55 09/29/2022   TRIG 112 09/29/2022   CHOLHDL 3.4 09/29/2022    Lab Results  Component Value Date   HGBA1C 7.8 (H)  09/29/2022    Assessment & Plan   1.  Shortness of breath with dyspnea on exertion has been gradually increasing over the last couple of weeks.  He denies any peripheral edema, early satiety, or orthopnea.  Was recently evaluated in the emergency department with complaints of shortness of breath.  He has been scheduled for an echocardiogram as his last study was done in 07/2021 to reevaluate function.  2.  Dizziness that he describes unlike vertigo.  States that it is when he is standing and when he is getting up from the sitting to the standing position or walking for extended periods of time.  Furosemide was discontinued today to see if that would assist with the dizziness.  There was no anemia or electrolyte abnormalities noted on blood work recently done in the emergency department.  3.  Multivessel coronary artery disease status post CABG with recent cardiac catheterization showing patent bypass grafts in October 2023.  Recommendation was to continue with medical therapy.  Unfortunately he is suffering and continued to suffer from episodes of angina.  He has been continued on aspirin 81 mg daily, carvedilol 3.125 mg daily, Isosorbide 60 mg in the a.m. 30 mg p.m., and pravastatin 20 mg daily along with ezetimibe 10  mg daily.  We also increase in antianginal therapy with starting him on Ranexa 500 mg twice daily new prescription was sent to [facility] per the patient's request.  4.  Hypertension with blood pressure today 110/56.  Which is lower than what he usually runs.  We have discontinued his furosemide for dizziness which would likely assist with his blood pressure as well.  He has been encouraged to continue to monitor his pressure at home as well.  5.  Mixed hyperlipidemia with most recent LDL of 55 which is at goal on 09/2022.  He is continued on Pravachol and Zetia.  6.  Type II diabetes with last hemoglobin A1c of 7.8.  He is continued on Farxiga, metformin, Tresiba, and [place].  This continues to be managed by his PCP.  7.  Disposition patient return to clinic to see MD/APP once echocardiogram is completed to reevaluate symptoms and tolerance of increase in antianginal therapy with the addition of Ranexa.  Korina Tretter, NP 12/11/2022, 4:32 PM

## 2022-12-11 NOTE — Patient Instructions (Signed)
It was a pleasure to see you today.  Thank you for giving [place] the opportunity to be involved in your care.  Below is a brief recap of your visit and next steps.  We will plan to see you again in 2 months  Summary Increase [name] to 5 mg weekly Add Farxiga 5 mg daily I have refilled sensors for your Freestyle Elenor Legato We will follow up in 2 months  Decrease your Tresiba by 4 units every 2-3 days for blood sugar readings < 90. Your morning blood sugar is reflective of your evening Tresiba injection. Your evening blood sugar is reflective of your morning Tresiba injection.

## 2022-12-15 DIAGNOSIS — R809 Proteinuria, unspecified: Secondary | ICD-10-CM | POA: Insufficient documentation

## 2022-12-15 DIAGNOSIS — E1129 Type 2 diabetes mellitus with other diabetic kidney complication: Secondary | ICD-10-CM | POA: Insufficient documentation

## 2022-12-15 NOTE — Assessment & Plan Note (Signed)
Moderate microalbuminuria noted on urine study from last month. -Start Farxiga 5 mg daily

## 2022-12-15 NOTE — Assessment & Plan Note (Signed)
Presenting today for diabetes follow-up.  Last A1c 7.8 in November.  [name] 2.5 mg weekly was started at his last appointment.  He is additionally prescribed metformin 1000 mg twice daily, Tresiba 60 and 70 morning and 70 units nightly, and Humalog sliding scale with meals.  He reports today that he has not needed to use Humalog with meals recently because his blood sugar readings have been in the "green zone". -Discontinue Humalog -Increase [name] to 5 mg weekly -Add Farxiga 5 mg daily in the setting of microalbuminuria -I reviewed with [name] [name] that he should be able to reduce his [name] requirements as [name] was increased.  He was instructed to reduce Tresiba by 4 units every 2-3 days for blood sugar readings < 90. -We will follow-up in 2 months for reassessment and repeat A1c at that time

## 2022-12-17 ENCOUNTER — Other Ambulatory Visit: Payer: Self-pay | Admitting: Internal Medicine

## 2022-12-21 ENCOUNTER — Telehealth: Payer: Self-pay | Admitting: Cardiology

## 2022-12-21 ENCOUNTER — Encounter (HOSPITAL_COMMUNITY): Payer: Self-pay | Admitting: Hematology

## 2022-12-21 NOTE — Telephone Encounter (Signed)
Patient is calling to get advice on upcoming appts and feeling like he is unable to go back to work until he is able to have upcoming tests completed. He says he is still in too much pain and does not feel comfortable returning until the test and appts. Requesting return call.

## 2022-12-21 NOTE — Telephone Encounter (Signed)
Patient given work note thru 12/19/22  I will forward to provider to review.

## 2022-12-21 NOTE — Telephone Encounter (Signed)
Patient called again to follow-up on getting his doctor's note extended.

## 2022-12-21 NOTE — Telephone Encounter (Signed)
Spoke with pt and informed that provider is not in office today and that a note has been sent to them. Pt voiced understanding.

## 2022-12-22 ENCOUNTER — Other Ambulatory Visit: Payer: Self-pay

## 2022-12-22 ENCOUNTER — Emergency Department (HOSPITAL_COMMUNITY)
Admission: EM | Admit: 2022-12-22 | Discharge: 2022-12-22 | Disposition: A | Discharge status: Home / Self Care | Payer: 59 | Attending: Emergency Medicine | Admitting: Emergency Medicine

## 2022-12-22 ENCOUNTER — Encounter (HOSPITAL_COMMUNITY): Payer: Self-pay | Admitting: Emergency Medicine

## 2022-12-22 ENCOUNTER — Emergency Department (HOSPITAL_COMMUNITY): Payer: 59

## 2022-12-22 DIAGNOSIS — Z7984 Long term (current) use of oral hypoglycemic drugs: Secondary | ICD-10-CM | POA: Diagnosis not present

## 2022-12-22 DIAGNOSIS — I509 Heart failure, unspecified: Secondary | ICD-10-CM | POA: Diagnosis not present

## 2022-12-22 DIAGNOSIS — I110 Hypertensive heart disease with heart failure: Secondary | ICD-10-CM | POA: Insufficient documentation

## 2022-12-22 DIAGNOSIS — R0602 Shortness of breath: Secondary | ICD-10-CM | POA: Diagnosis not present

## 2022-12-22 DIAGNOSIS — I2510 Atherosclerotic heart disease of native coronary artery without angina pectoris: Secondary | ICD-10-CM | POA: Diagnosis not present

## 2022-12-22 DIAGNOSIS — Z79899 Other long term (current) drug therapy: Secondary | ICD-10-CM | POA: Insufficient documentation

## 2022-12-22 DIAGNOSIS — Z7982 Long term (current) use of aspirin: Secondary | ICD-10-CM | POA: Insufficient documentation

## 2022-12-22 DIAGNOSIS — R079 Chest pain, unspecified: Secondary | ICD-10-CM | POA: Diagnosis not present

## 2022-12-22 DIAGNOSIS — Z1152 Encounter for screening for COVID-19: Secondary | ICD-10-CM | POA: Insufficient documentation

## 2022-12-22 DIAGNOSIS — Z951 Presence of aortocoronary bypass graft: Secondary | ICD-10-CM | POA: Diagnosis not present

## 2022-12-22 DIAGNOSIS — Z794 Long term (current) use of insulin: Secondary | ICD-10-CM | POA: Insufficient documentation

## 2022-12-22 DIAGNOSIS — E119 Type 2 diabetes mellitus without complications: Secondary | ICD-10-CM | POA: Insufficient documentation

## 2022-12-22 DIAGNOSIS — R0789 Other chest pain: Secondary | ICD-10-CM | POA: Diagnosis not present

## 2022-12-22 LAB — CBC
HCT: 41.3 % (ref 39.0–52.0)
Hemoglobin: 14.7 g/dL (ref 13.0–17.0)
MCH: 31.7 pg (ref 26.0–34.0)
MCHC: 35.6 g/dL (ref 30.0–36.0)
MCV: 89 fL (ref 80.0–100.0)
Platelets: 68 10*3/uL — ABNORMAL LOW (ref 150–400)
RBC: 4.64 MIL/uL (ref 4.22–5.81)
RDW: 13.9 % (ref 11.5–15.5)
WBC: 2.4 10*3/uL — ABNORMAL LOW (ref 4.0–10.5)
nRBC: 0 % (ref 0.0–0.2)

## 2022-12-22 LAB — RESP PANEL BY RT-PCR (RSV, FLU A&B, COVID)  RVPGX2
Influenza A by PCR: NEGATIVE
Influenza B by PCR: NEGATIVE
Resp Syncytial Virus by PCR: NEGATIVE
SARS Coronavirus 2 by RT PCR: NEGATIVE

## 2022-12-22 LAB — BASIC METABOLIC PANEL
Anion gap: 11 (ref 5–15)
BUN: 17 mg/dL (ref 8–23)
CO2: 21 mmol/L — ABNORMAL LOW (ref 22–32)
Calcium: 8.6 mg/dL — ABNORMAL LOW (ref 8.9–10.3)
Chloride: 105 mmol/L (ref 98–111)
Creatinine, Ser: 1.21 mg/dL (ref 0.61–1.24)
GFR, Estimated: >60 mL/min (ref >60)
Glucose, Bld: 122 mg/dL — ABNORMAL HIGH (ref 70–99)
Potassium: 3.6 mmol/L (ref 3.5–5.1)
Sodium: 137 mmol/L (ref 135–145)

## 2022-12-22 LAB — TROPONIN I (HIGH SENSITIVITY)
Troponin I (High Sensitivity): 3 ng/L (ref <18)
Troponin I (High Sensitivity): 3 ng/L (ref <18)

## 2022-12-22 MED ORDER — PANTOPRAZOLE SODIUM 40 MG IV SOLR
40.0000 mg | Freq: Once | INTRAVENOUS | Status: AC
  Administered 2022-12-22: 40 mg via INTRAVENOUS
  Filled 2022-12-22: qty 10

## 2022-12-22 MED ORDER — ONDANSETRON HCL 4 MG/2ML IJ SOLN
4.0000 mg | Freq: Once | INTRAMUSCULAR | Status: AC
  Administered 2022-12-22: 4 mg via INTRAVENOUS
  Filled 2022-12-22: qty 2

## 2022-12-22 MED ORDER — MORPHINE SULFATE (PF) 4 MG/ML IV SOLN
3.0000 mg | Freq: Once | INTRAVENOUS | Status: AC
  Administered 2022-12-22: 3 mg via INTRAVENOUS
  Filled 2022-12-22: qty 1

## 2022-12-22 NOTE — ED Triage Notes (Signed)
Pt arrives POV c/o mid sternal chest pain, describes pain as a heaviness. Pain radiates to left shoulder and neck. Pt states pain started about 2 weeks ago, pt states pain is worse today.  Took nitro about 20 min PTA.

## 2022-12-22 NOTE — ED Notes (Signed)
ED Provider at bedside. 

## 2022-12-22 NOTE — ED Notes (Signed)
Patient transported to X-ray 

## 2022-12-22 NOTE — Discharge Instructions (Addendum)
I have listed Dr. Myles Gip information for you and Dr. Cathie Hoops.  Someone from the cardiology office should contact you to arrange follow-up appointment for tomorrow.

## 2022-12-22 NOTE — Telephone Encounter (Signed)
Patient was given work slip by S.Hammock,she will need to address this please.

## 2022-12-22 NOTE — Telephone Encounter (Signed)
Patient is calling for work note, he needs it for today.  He is returning to work today.

## 2022-12-22 NOTE — Telephone Encounter (Signed)
Patient is following up, requesting advisement as soon as possible. I informed the patient (per Legrand Rams) [name] [name] is scheduled to return to the office on Friday, 2/16. He would like to know if anyone else is able to advise on her behalf.

## 2022-12-22 NOTE — ED Provider Notes (Signed)
[facility] Provider Note   CSN: [id number] Arrival date & time: 12/22/22  1944     History  Chief Complaint  Patient presents with   Chest Pain    [name] [name] is a 67 y.o. male.   Chest Pain Associated symptoms: abdominal pain, nausea and shortness of breath   Associated symptoms: no back pain, no cough, no dizziness, no fever, no headache, no numbness, no vomiting and no weakness         [name] [name] is a 67 y.o. male with hx of CAD, DM, HTN, CHF, cirrhosis, who presents to the Emergency Department complaining of persistent left sided chest pain.  Chest pain has been intermittent x 2 weeks.  Some dyspnea on exertion.  He was seen here on 12/09/22 for same.  He had follow up appt with his cardiologist and he is scheduled to have echo on Monday 2/19.  He describes dull to sharp pains of his left chest that has been worse today.  Pain occasionally radiates to his left shoulder and left neck.  He took 1 nitroglycerin with some relief.  His symptoms have also been associated with some upper abdominal pain and nausea.   History of multiple left heart cath with latest catheterization October 2023, CABG 2017, echo 07/2021 with EF 60-65%  Cards  Dr Domenic Polite    Home Medications Prior to Admission medications   Medication Sig Start Date End Date Taking? Authorizing Provider  albuterol (VENTOLIN HFA) 108 (90 Base) MCG/ACT inhaler Inhale 2 puffs into the lungs every 6 (six) hours as needed for wheezing or shortness of breath.    [provider]  aspirin EC 81 MG tablet Take 81 mg by mouth daily. Swallow whole.    [provider]  carvedilol (COREG) 3.125 MG tablet Take 1 tablet (3.125 mg total) by mouth 2 (two) times daily. 10/21/22   [name], MD  citalopram (CELEXA) 20 MG tablet Take 1 tablet (20 mg total) by mouth at bedtime. 10/21/22   [name], MD  Continuous Blood Gluc Receiver (FREESTYLE LIBRE 2  READER) DEVI 1 Device by Does not apply route continuous. 10/21/22   [name], MD  Continuous Blood Gluc Sensor (FREESTYLE LIBRE 2 SENSOR) MISC Apply one sensor every 14 days 12/11/22   [name], MD  cyclobenzaprine (FLEXERIL) 10 MG tablet Take 1 tablet (10 mg total) by mouth 3 (three) times daily as needed for muscle spasms. 10/21/22   [name], MD  dapagliflozin propanediol (FARXIGA) 5 MG TABS tablet Take 1 tablet (5 mg total) by mouth daily before breakfast. 12/11/22 03/11/23  [name], MD  ezetimibe (ZETIA) 10 MG tablet Take 1 tablet (10 mg total) by mouth daily. 10/21/22   [name], MD  fluticasone (FLONASE) 50 MCG/ACT nasal spray Place 2 sprays into both nostrils 2 (two) times daily. 10/21/22   [name], MD  Fluticasone-Salmeterol (ADVAIR) 250-50 MCG/DOSE AEPB Inhale 1 puff into the lungs every 12 (twelve) hours.    [provider]  gabapentin (NEURONTIN) 300 MG capsule Take 1 capsule (300 mg total) by mouth 3 (three) times daily. Take 300 mg by mouth 3 (three) times daily. 10/21/22   [name], MD  HUMALOG KWIKPEN 100 UNIT/ML KwikPen Inject 5-30 Units into the skin 3 (three) times daily as needed (blood sugar of 200 or higher). Sliding scale 10/21/22   [name], MD  isosorbide mononitrate (  IMDUR) 60 MG 24 hr tablet Take one (1) tablet by mouth ( 60 mg ) in the am and one half tablet ( 30 mg) in the pm. 10/21/22   [name], MD  Melatonin 10 MG TABS Take 10 mg by mouth at bedtime.    [provider]  metFORMIN (GLUCOPHAGE) 1000 MG tablet Take 1 tablet (1,000 mg total) by mouth 2 (two) times daily. 10/21/22   [name], MD  nitroGLYCERIN (NITROSTAT) 0.4 MG SL tablet Place 1 tablet (0.4 mg total) under the tongue every 5 (five) minutes x 3 doses as needed for chest pain (if no relief after 2nd dose, proceed to the ED for an evaluation or call 911). Place 0.4 mg under the tongue every 5 (five) minutes x 3 doses as  needed for chest pain (if no relief after 2nd dose, proceed to the ED for an evaluation or call 911). 10/21/22   [name], MD  omeprazole (PRILOSEC) 40 MG capsule Take 1 capsule (40 mg total) by mouth daily. 10/21/22   [name], MD  pravastatin (PRAVACHOL) 20 MG tablet Take 1 tablet (20 mg total) by mouth daily. 10/21/22   [name], MD  ranolazine (RANEXA) 500 MG 12 hr tablet Take 1 tablet (500 mg total) by mouth 2 (two) times daily. 12/11/22   Gerrie Nordmann, NP  tirzepatide [facility]) 5 MG/0.5ML Pen Inject 5 mg into the skin once a week. 12/11/22   [name], MD  topiramate (TOPAMAX) 50 MG tablet Take 50 mg by mouth 2 (two) times daily. 11/20/22   [provider]  TRESIBA FLEXTOUCH 100 UNIT/ML FlexTouch Pen Inject 65 Units into the skin in the morning and at bedtime. Inject 60 units in the AM and 70 units in the PM. 10/21/22   [name], MD      Allergies    Divalproex sodium, Statins, Tramadol, Valproic acid, Gadolinium derivatives, and Tricor [fenofibrate]    Review of Systems   Review of Systems  Constitutional:  Negative for chills and fever.  Respiratory:  Positive for shortness of breath. Negative for cough.   Cardiovascular:  Positive for chest pain.  Gastrointestinal:  Positive for abdominal pain and nausea. Negative for vomiting.  Musculoskeletal:  Negative for back pain.  Neurological:  Negative for dizziness, weakness, numbness and headaches.  Psychiatric/Behavioral:  Negative for confusion.     Physical Exam Updated Vital Signs BP 129/76 (BP Location: Right Arm)   Pulse 74   Temp 98 F (36.7 C) (Oral)   Resp 15   Ht 5' 9"$  (1.753 m)   Wt 100.2 kg   SpO2 97%   BMI 32.64 kg/m  Physical Exam Vitals and nursing note reviewed.  Constitutional:      General: He is not in acute distress.    Appearance: Normal appearance. He is not toxic-appearing.  Cardiovascular:     Rate and Rhythm: Normal rate and regular rhythm.     Pulses:  Normal pulses.  Pulmonary:     Effort: Pulmonary effort is normal. No respiratory distress.     Breath sounds: Normal breath sounds.  Abdominal:     General: There is no distension.     Palpations: Abdomen is soft.     Tenderness: There is no abdominal tenderness. There is no guarding.  Musculoskeletal:        General: Normal range of motion.     Right lower leg: No edema.     Left lower  leg: No edema.  Skin:    General: Skin is warm.     Capillary Refill: Capillary refill takes less than 2 seconds.     Findings: No rash.  Neurological:     General: No focal deficit present.     Mental Status: He is alert.     Sensory: No sensory deficit.     Motor: No weakness.     ED Results / Procedures / Treatments   Labs (all labs ordered are listed, but only abnormal results are displayed) Labs Reviewed  BASIC METABOLIC PANEL - Abnormal; Notable for the following components:      Result Value   CO2 21 (*)    Glucose, Bld 122 (*)    Calcium 8.6 (*)    All other components within normal limits  CBC  TROPONIN I (HIGH SENSITIVITY)    EKG EKG Interpretation  Date/Time:  Tuesday December 22 2022 19:53:23 EST Ventricular Rate:  75 PR Interval:  140 QRS Duration: 90 QT Interval:  416 QTC Calculation: 464 R Axis:   49 Text Interpretation: Normal sinus rhythm Normal ECG When compared with ECG of 09-Dec-2022 13:10, T wave inversion no longer evident in Inferior leads Confirmed by Noemi Chapel [phone]) on 12/22/2022 7:55:30 PM  Radiology DG Chest 2 View  Result Date: 12/22/2022 CLINICAL DATA:  Chest pain EXAM: CHEST - 2 VIEW COMPARISON:  12/09/2022 FINDINGS: Lungs are clear. No pneumothorax or pleural effusion. Coronary artery bypass grafting has been performed. Cardiac size within normal limits. Osseous structures are age-appropriate. IMPRESSION: 1. No active cardiopulmonary disease. Electronically Signed   By: Fidela Salisbury M.D.   On: 12/22/2022 20:52    Procedures Procedures     Medications Ordered in ED Medications - No data to display  ED Course/ Medical Decision Making/ A&P                             Medical Decision Making Patient here with complaint of intermittent chest pain x 2 weeks.  Seen here end of January for same recurrent pain today that was worse in severity.  Associated with some nausea, upper abdominal pain and dyspnea on exertion.  Some pain radiating into his left shoulder and left neck.  After his previous visit in January, he was seen by cardiology and echocardiogram scheduled for next Monday.  Patient has significant cardiac history.  Differential would include ACS, PE, dissection, musculoskeletal, pneumonia.  Given his history, he will need full cardiac workup.  Amount and/or Complexity of Data Reviewed Labs: ordered.    Details: Labs interpreted by me, patient leukopenic, near baseline.  Chemistries without significant derangement.  Respiratory panel negative delta troponin unchanged. Radiology: ordered.    Details: Chest x-ray without acute cardiopulmonary disease ECG/medicine tests: ordered.    Details: EKG shows normal sinus rhythm normal EKG compared Discussion of management or test interpretation with external provider(s): Discussed patient findings with cardiology, Dr. Cathie Hoops who agrees with workup.  Patient had cath with patent graft October 2023.  Agrees with workup, recommends evaluation for noncardiac sources.  Will see patient in a.m. for follow-up  Reassuring workup this evening.  Vital signs reassuring.  Given IV pantoprazole and Zofran for nausea.  Given single dose of IV morphine for pain. Pt resting comfortably on recheck.  Appears appropriate for discharge home agrees to cardiology follow-up in a.m.                 Document critical  care time when appropriate:1}      Final Clinical Impression(s) / ED Diagnoses Final diagnoses:  Nonspecific chest pain  Chest pain with high risk of acute coronary  syndrome    Rx / DC Orders ED Discharge Orders     None         Bufford Lope 12/22/22 2332    Noemi Chapel, MD 12/23/22 [phone]

## 2022-12-23 ENCOUNTER — Ambulatory Visit (INDEPENDENT_AMBULATORY_CARE_PROVIDER_SITE_OTHER): Payer: 59 | Admitting: Internal Medicine

## 2022-12-23 ENCOUNTER — Encounter: Payer: Self-pay | Admitting: Internal Medicine

## 2022-12-23 VITALS — BP 116/64 | HR 76 | Resp 16 | Ht 69.0 in | Wt 214.0 lb

## 2022-12-23 DIAGNOSIS — R079 Chest pain, unspecified: Secondary | ICD-10-CM | POA: Diagnosis not present

## 2022-12-23 NOTE — Progress Notes (Signed)
   HPI:[name] is a 67 y.o. 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  Patient had visit to the emergency department on 1/31 and 2/13 for chest pain. He described sharp chest pain associated with shortness of breath. The pain comes on with activity and goes away with rest and nitroglycerin, radiates to his left shoulder/neck. He has follow up with cardiology tomorrow.     Physical Exam: Vitals:   12/23/22 1131  BP: 116/64  Pulse: 76  Resp: 16  SpO2: 93%  Weight: 214 lb (97.1 kg)  Height: 5\' 9"  (1.753 m)     Physical Exam Constitutional:      General: He is not in acute distress.    Appearance: He is well-developed and well-groomed. He is not ill-appearing.  Eyes:     General: No scleral icterus.    Conjunctiva/sclera: Conjunctivae normal.  Cardiovascular:     Rate and Rhythm: Normal rate and regular rhythm.     Heart sounds: No murmur heard.    No friction rub. No gallop.  Pulmonary:     Effort: Pulmonary effort is normal. No respiratory distress.     Breath sounds: No wheezing, rhonchi or rales.  Musculoskeletal:     Right lower leg: No edema.     Left lower leg: No edema.  Skin:    General: Skin is warm and dry.      Assessment & Plan:   Chest pain Last LHC in 2023 with patent grafts. Review of records show normal HS troponin in ED and no acute ischemic changes on EKG. No aortic dissection or lung pathology on xray. I did a limited cardiac ultrasound in clinic and no pericardial effusion and EF appears normal. He is schedule for a a formal echo on Monday. He is on Ranexa and Imdur. My recommendation is to follow up with cardiology. Once he is cleared from a cardiac standpoint follow up if he continues to have chest pain. The only change he can remember that started around this time is he started mounjaro. This is not a common side effect. Reports history of asthma, no wheezing today. I recommend using his rescue inhaler  if he develops chest pain and shortness of breath to see if it helps.     Lorene Dy, MD

## 2022-12-23 NOTE — Assessment & Plan Note (Signed)
Last LHC in 2023 with patent grafts. Review of records show normal HS troponin in ED and no acute ischemic changes on EKG. No aortic dissection or lung pathology on xray. I did a limited cardiac ultrasound in clinic and no pericardial effusion and EF appears normal. He is schedule for a a formal echo on Monday. He is on Ranexa and Imdur. My recommendation is to follow up with cardiology. Once he is cleared from a cardiac standpoint follow up if he continues to have chest pain. The only change he can remember that started around this time is he started mounjaro. This is not a common side effect. Reports history of asthma, no wheezing today. I recommend using his rescue inhaler if he develops chest pain and shortness of breath to see if it helps.

## 2022-12-23 NOTE — Patient Instructions (Signed)
Thank you for trusting me with your care. I did not find a reason for your chest pain today. I recommend following up with cardiologist on Friday. Follow up with [place] if you continue to have symptoms after the cardiologist has evaluated you.

## 2022-12-23 NOTE — Telephone Encounter (Signed)
Secure chat sent to S. Hammock, NP in regards to extending patients work note until 2/13. Provider agreeable. Will contact patient to come pick up new note.

## 2022-12-24 ENCOUNTER — Telehealth: Payer: Self-pay | Admitting: Internal Medicine

## 2022-12-24 ENCOUNTER — Encounter: Payer: Self-pay | Admitting: Internal Medicine

## 2022-12-24 ENCOUNTER — Other Ambulatory Visit: Payer: Self-pay | Admitting: Internal Medicine

## 2022-12-24 ENCOUNTER — Telehealth: Payer: Self-pay | Admitting: Cardiology

## 2022-12-24 DIAGNOSIS — U071 COVID-19: Secondary | ICD-10-CM

## 2022-12-24 MED ORDER — NIRMATRELVIR/RITONAVIR (PAXLOVID)TABLET: 3.0000 | ORAL_TABLET | Freq: Two times a day (BID) | ORAL | 0 refills | Status: DC

## 2022-12-24 NOTE — Telephone Encounter (Signed)
Pt wife called stating she is wanting to know what to do. States when he was seen in ER they told him he was flu & covid negative. Now he is running a fever of 103.7 & home covid test +. States he is also still having the pain/chest issues. Can you please contact patient wife?    Stanton Kidney [phone]

## 2022-12-24 NOTE — Progress Notes (Signed)
Contacted the patient's wife Zi Waterman) this morning.  [name] has tested positive for COVID-19 on a home test.  He endorses symptoms of sinus congestion, nonproductive cough, and fever (Tmax 103.7 F).  She is wondering what treatment options are available.  I have prescribed Paxlovid.  I reviewed standard COVID-19 precautions and supportive care measures.  I also recommended that they have a low threshold return to the emergency department given his chronic medical conditions and recent chest pain.

## 2022-12-24 NOTE — Telephone Encounter (Signed)
Seen Dr Court Joy yesterday. Has cardiology appt tomorrow. Wife wants to know what she should do and if he needs any med?

## 2022-12-24 NOTE — Telephone Encounter (Signed)
Apt tomorrow converted to VV  Will have front office staff r/s echo  Wife has called PCP to get Paxlovid as she states he has a fever of 103 .

## 2022-12-24 NOTE — Telephone Encounter (Signed)
Wife stated patient tested positive today for COVID and wants a virtual visit on 2/16 instead of in-office visit.  Wife also noted patient has Echo test on Monday and wants to confirm patient can keep that appointment.

## 2022-12-25 ENCOUNTER — Encounter: Payer: Self-pay | Admitting: Medical

## 2022-12-25 ENCOUNTER — Telehealth: Payer: Self-pay

## 2022-12-25 ENCOUNTER — Ambulatory Visit: Payer: 59 | Attending: Medical | Admitting: Medical

## 2022-12-25 VITALS — BP 146/69 | HR 98 | Temp 101.3°F | Ht 69.0 in | Wt 214.0 lb

## 2022-12-25 DIAGNOSIS — I2510 Atherosclerotic heart disease of native coronary artery without angina pectoris: Secondary | ICD-10-CM | POA: Diagnosis not present

## 2022-12-25 DIAGNOSIS — E782 Mixed hyperlipidemia: Secondary | ICD-10-CM

## 2022-12-25 DIAGNOSIS — I10 Essential (primary) hypertension: Secondary | ICD-10-CM

## 2022-12-25 DIAGNOSIS — R42 Dizziness and giddiness: Secondary | ICD-10-CM | POA: Diagnosis not present

## 2022-12-25 NOTE — Patient Instructions (Signed)
Medication Instructions:  Your physician recommends that you continue on your current medications as directed. Please refer to the Current Medication list given to you today.   Labwork: None today  Testing/Procedures: None today  Follow-Up: After echo on 01/27/23  Any Other Special Instructions Will Be Listed Below (If Applicable).  If you need a refill on your cardiac medications before your next appointment, please call your pharmacy.

## 2022-12-25 NOTE — Progress Notes (Addendum)
Virtual Visit via Telephone Note   Because of [name] [name] Scales's co-morbid illnesses, he is at least at moderate risk for complications without adequate follow up.  This format is felt to be most appropriate for this patient at this time.  The patient did not have access to video technology/had technical difficulties with video requiring transitioning to audio format only (telephone).  All issues noted in this document were discussed and addressed.  No physical exam could be performed with this format.  Please refer to the patient's chart for his consent to telehealth for [facility].    Date:  01/06/2023   ID:  [name], [name] [birth year], MRN [id number] The patient was identified using 2 identifiers.  Patient Location: Home Provider Location: Office/Clinic   PCP:  [name], [place] [facility] Cardiologist:  Rozann Lesches, MD     Evaluation Performed:  Follow-Up Visit  Chief Complaint:  2 week follow-up  History of Present Illness:    [name] [name] is a 67 y.o. male with a h/o CAD, HLD, HTN, diastolic congestive heart failure, TIA/CVA, OSA, GERD, DM2 who is being seen for 2 week follow-up.   Most recent [facility] 08/2022 showed severe 3V CAD s/p CABG with 3/3 patent bypass grafts, chronic occluded obtuse marginal branch of the previously stented segment, patent free RIMA graft to the obtuse marginal branch, large dominant RCA with mild diffuse disease, PDA is chronically occluded, patent SVG-PDA. Recommendations were for medical management of CAD.   He was evaluated at [facility] on 12/09/22 with chest pain. HS troponin was negative x 2. Work-up was otherwise normal.   He was seen in the office 12/11/22 and reported continued chest pain and SOB. An echo was ordered. Ranexa was sent in.   Today,  the patient is accompanied by his wife. The patient has COVID so appointment was changed to virtual; he was diagnosed yesterday. He is overall feeling poorly.  The patient reports persistent chest pain. It feels like a pressure and he has sharp pain down the neck. It's occurring every day. Breathing is still short, worse with COVID. O2 can be low at times, can drop into the 80s. Unsure if the Ranexa helped. Pulse is high, over 100.   Past Medical History:  Diagnosis Date   Anxiety    Aortic insufficiency    Arthritis    Asthma    Cataract    Right eye   Chronic lower back pain    Cirrhosis of liver ([place])    Coronary atherosclerosis of native coronary artery    a. 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   LIMA to LAD, free RIMA to OM2, SVG to PDA, open SVG harvest from right thigh   Stroke ([place])    Thrombocytopenia (HCC)    TIA (transient ischemic attack)    Type 2 diabetes mellitus ([place])    Past Surgical History:  Procedure Laterality Date   ANTERIOR CERVICAL DECOMP/DISCECTOMY FUSION  1998   "  C3-4"   CARDIAC CATHETERIZATION  "several"   CARDIAC CATHETERIZATION N/A 12/30/2015   Procedure: Left Heart Cath and Coronary Angiography;  Surgeon: [name], MD;  Location: [place] CV LAB;  Service: Cardiovascular;  Laterality: N/A;   CARPAL TUNNEL RELEASE Bilateral 2005   CHOLECYSTECTOMY N/A 11/28/2018   Procedure: LAPAROSCOPIC CHOLECYSTECTOMY;  Surgeon: [name], MD;  Location: [facility];  Service: General;  Laterality: N/A;   COLONOSCOPY     COLONOSCOPY WITH PROPOFOL N/A 09/27/2020   Procedure: COLONOSCOPY WITH PROPOFOL;  Surgeon: [name], MD;  Location: [facility];  Service: Gastroenterology;  Laterality: N/A;   CORONARY ANGIOPLASTY      CORONARY ANGIOPLASTY WITH STENT PLACEMENT  2002; 2003; 11/20/2014   "I have 4 stents after today" (11/20/2014)   CORONARY ARTERY BYPASS GRAFT N/A 01/09/2016   Procedure: CORONARY ARTERY BYPASS GRAFTING (CABG) X 3 UTILIZING RIGHT AND LEFT INTERNAL MAMMARY ARTERY AND ENDOSCOPICALLY HARVESTED SAPHENEOUS VEIN.;  Surgeon: Rexene Alberts, MD;  Location: [place];  Service: Open Heart Surgery;  Laterality: N/A;   ESOPHAGOGASTRODUODENOSCOPY     ESOPHAGOGASTRODUODENOSCOPY (EGD) WITH PROPOFOL N/A 09/27/2020   Procedure: ESOPHAGOGASTRODUODENOSCOPY (EGD) WITH PROPOFOL;  Surgeon: [name], MD;  [facility];  Service: Gastroenterology;  Laterality: N/A;  10   KNEE SURGERY Left 02/2012   "scraped; open"   LEFT HEART CATH AND CORS/GRAFTS ANGIOGRAPHY N/A 08/09/2017   Procedure: LEFT HEART CATH AND CORS/GRAFTS ANGIOGRAPHY;  Surgeon: [name], MD;  Location: [place] CV LAB;  Service: Cardiovascular;  Laterality: N/A;   LEFT HEART CATH AND CORS/GRAFTS ANGIOGRAPHY N/A 11/22/2018   Procedure: LEFT HEART CATH AND CORS/GRAFTS ANGIOGRAPHY;  Surgeon: [name], [name], MD;  Location: [place] CV LAB;  Service: Cardiovascular;  Laterality: N/A;   LEFT HEART CATH AND CORS/GRAFTS ANGIOGRAPHY N/A 08/20/2022   Procedure: LEFT HEART CATH AND CORS/GRAFTS ANGIOGRAPHY;  Surgeon: Burnell Blanks, MD;  Location: [place] CV LAB;  Service: Cardiovascular;  Laterality: N/A;   LEFT HEART CATHETERIZATION WITH CORONARY ANGIOGRAM N/A 07/20/2012   Procedure: LEFT HEART CATHETERIZATION WITH CORONARY ANGIOGRAM;  Surgeon: [name], MD;  Location: [place] CATH LAB;  Service: Cardiovascular;  Laterality: N/A;   LEFT HEART CATHETERIZATION WITH CORONARY ANGIOGRAM N/A 11/20/2014   Procedure: LEFT HEART CATHETERIZATION WITH CORONARY ANGIOGRAM;  Surgeon: [name], MD;  Location: [facility] CATH LAB;  Service: Cardiovascular;  Laterality: N/A;   LEFT HEART CATHETERIZATION WITH CORONARY ANGIOGRAM N/A  11/26/2014   Procedure: LEFT HEART CATHETERIZATION WITH CORONARY ANGIOGRAM;  Surgeon: [name], MD;  Location: [facility] CATH LAB;  Service: Cardiovascular;  Laterality: N/A;   NEUROPLASTY / TRANSPOSITION ULNAR NERVE AT ELBOW Right ~ 2012   PERCUTANEOUS CORONARY ROTOBLATOR INTERVENTION (PCI-R)  11/20/2014   Procedure: PERCUTANEOUS CORONARY ROTOBLATOR INTERVENTION (PCI-R);  Surgeon: [name], MD;  Location: [facility] CATH LAB;  Service: Cardiovascular;;   POSTERIOR CERVICAL LAMINECTOMY Left 04/16/2022   Procedure: Laminectomy and Foraminotomy - left - C6-C7;  Surgeon: Earnie Larsson, MD;  Location: [place];  Service: Neurosurgery;  Laterality: Left;  3C   SHOULDER ARTHROSCOPY Left ~ 2011   TEE WITHOUT CARDIOVERSION N/A 01/09/2016   Procedure: TRANSESOPHAGEAL ECHOCARDIOGRAM (TEE);  Surgeon: Rexene Alberts, MD;  Location: [place];  Service: Open Heart Surgery;  Laterality: N/A;     Current Meds  Medication Sig   albuterol (VENTOLIN HFA) 108 (90 Base) MCG/ACT inhaler Inhale 2 puffs into the lungs every 6 (six) hours as needed for wheezing or  shortness of breath.   Fluticasone-Salmeterol (ADVAIR) 250-50 MCG/DOSE AEPB Inhale 1 puff into the lungs every 12 (twelve) hours.   Melatonin 10 MG TABS Take 10 mg by mouth at bedtime.   polycarbophil (FIBERCON) 625 MG tablet Take 625 mg by mouth daily.   topiramate (TOPAMAX) 50 MG tablet Take 50 mg by mouth 2 (two) times daily.   [DISCONTINUED] aspirin EC 81 MG tablet Take 81 mg by mouth daily. Swallow whole.   [DISCONTINUED] carvedilol (COREG) 3.125 MG tablet Take 1 tablet (3.125 mg total) by mouth 2 (two) times daily.   [DISCONTINUED] citalopram (CELEXA) 20 MG tablet Take 1 tablet (20 mg total) by mouth at bedtime.   [DISCONTINUED] Continuous Blood Gluc Receiver (FREESTYLE LIBRE 2 READER) DEVI 1 Device by Does not apply route continuous.   [DISCONTINUED] Continuous Blood Gluc Sensor (FREESTYLE LIBRE 2 SENSOR) MISC Apply one sensor every 14 days   [DISCONTINUED]  cyclobenzaprine (FLEXERIL) 10 MG tablet Take 1 tablet (10 mg total) by mouth 3 (three) times daily as needed for muscle spasms.   [DISCONTINUED] dapagliflozin propanediol (FARXIGA) 5 MG TABS tablet Take 1 tablet (5 mg total) by mouth daily before breakfast.   [DISCONTINUED] ezetimibe (ZETIA) 10 MG tablet Take 1 tablet (10 mg total) by mouth daily.   [DISCONTINUED] fluticasone (FLONASE) 50 MCG/ACT nasal spray Place 2 sprays into both nostrils 2 (two) times daily.   [DISCONTINUED] gabapentin (NEURONTIN) 300 MG capsule Take 1 capsule (300 mg total) by mouth 3 (three) times daily. Take 300 mg by mouth 3 (three) times daily.   [DISCONTINUED] HUMALOG KWIKPEN 100 UNIT/ML KwikPen Inject 5-30 Units into the skin 3 (three) times daily as needed (blood sugar of 200 or higher). Sliding scale   [DISCONTINUED] isosorbide mononitrate (IMDUR) 60 MG 24 hr tablet Take one (1) tablet by mouth ( 60 mg ) in the am and one half tablet ( 30 mg) in the pm.   [DISCONTINUED] metFORMIN (GLUCOPHAGE) 1000 MG tablet Take 1 tablet (1,000 mg total) by mouth 2 (two) times daily.   [DISCONTINUED] nirmatrelvir/ritonavir (PAXLOVID) 20 x 150 MG & 10 x '100MG'$  TABS Take 3 tablets by mouth 2 (two) times daily for 5 days. (Take nirmatrelvir 150 mg two tablets twice daily for 5 days and ritonavir 100 mg one tablet twice daily for 5 days) Patient GFR is > 60   [DISCONTINUED] nitroGLYCERIN (NITROSTAT) 0.4 MG SL tablet Place 1 tablet (0.4 mg total) under the tongue every 5 (five) minutes x 3 doses as needed for chest pain (if no relief after 2nd dose, proceed to the ED for an evaluation or call 911). Place 0.4 mg under the tongue every 5 (five) minutes x 3 doses as needed for chest pain (if no relief after 2nd dose, proceed to the ED for an evaluation or call 911).   [DISCONTINUED] omeprazole (PRILOSEC) 40 MG capsule Take 1 capsule (40 mg total) by mouth daily.   [DISCONTINUED] pravastatin (PRAVACHOL) 20 MG tablet Take 1 tablet (20 mg total) by mouth  daily.   [DISCONTINUED] ranolazine (RANEXA) 500 MG 12 hr tablet Take 1 tablet (500 mg total) by mouth 2 (two) times daily.   [DISCONTINUED] tirzepatide [facility]) 5 MG/0.5ML Pen Inject 5 mg into the skin once a week.   [DISCONTINUED] TRESIBA FLEXTOUCH 100 UNIT/ML FlexTouch Pen Inject 65 Units into the skin in the morning and at bedtime. Inject 60 units in the AM and 70 units in the PM.     Allergies:   Divalproex sodium, Statins, Tramadol, Valproic  acid, Gadolinium derivatives, and Tricor [fenofibrate]   Social History   Tobacco Use   Smoking status: Never   Smokeless tobacco: Never  Vaping Use   Vaping Use: Never used  Substance Use Topics   Alcohol use: No   Drug use: No     Family Hx: The patient's family history includes Asthma in his sister and sister; Autism in his son; Cancer in his paternal grandfather; Coronary artery disease (age of onset: 66) in his father; Heart attack in his paternal grandmother; Heart disease in his paternal uncle; Migraines in his son and son; Multiple sclerosis in his brother; Seizures in his son; Stroke in his maternal grandmother and mother.  ROS:   Please see the history of present illness.     All other systems reviewed and are negative.   Prior CV studies:   The following studies were reviewed today:  Cardiac Cath 08/2022   Mid LAD lesion is 90% stenosed.   Ost LAD lesion is 50% stenosed.   Prox Cx lesion is 50% stenosed.   Mid RCA lesion is 40% stenosed.   Dist RCA lesion is 40% stenosed.   Origin to Prox Graft lesion is 40% stenosed.   1st Diag lesion is 80% stenosed.   Ost RPDA lesion is 100% stenosed.   Prox LAD to Mid LAD lesion is 100% stenosed.   1st Mrg lesion is 100% stenosed.   SVG graft was visualized by angiography and is normal in caliber.   LIMA graft was visualized by angiography and is normal in caliber.   RIMA graft was visualized by angiography and is normal in caliber.   Severe triple vessel CAD s/p 3V CABG with  3/3 patent bypass grafts Chronic occlusion mid LAD. The mid and distal LAD fills from the patent LIMA graft Chronic occlusion obtuse marginal branch in the previously stented segment. Patent free RIMA graft to the obtuse marginal branch Large dominant RCA with mild diffuse disease. The PDA is chronically occluded. Patent SVG to PDA.    Recommendations: Continue medical management of CAD  Echo 07/2021  1. Left ventricular ejection fraction, by estimation, is 60 to 65%. The  left ventricle has normal function. The left ventricle has no regional  wall motion abnormalities. There is moderate left ventricular hypertrophy.  Left ventricular diastolic  parameters were normal.   2. Right ventricular systolic function is normal. The right ventricular  size is normal. There is normal pulmonary artery systolic pressure.   3. Left atrial size was mildly dilated.   4. The mitral valve is normal in structure. No evidence of mitral valve  regurgitation. No evidence of mitral stenosis.   5. The aortic valve has an indeterminant number of cusps. There is mild  calcification of the aortic valve. There is mild thickening of the aortic  valve. Aortic valve regurgitation is mild to moderate. No aortic stenosis  is present.   6. The inferior vena cava is normal in size with greater than 50%  respiratory variability, suggesting right atrial pressure of 3 mmHg.    Labs/Other Tests and Data Reviewed:    EKG:  No ECG reviewed.  Recent Labs: 09/29/2022: TSH 1.590 12/27/2022: ALT 21; Magnesium 1.8 12/28/2022: BUN 15; Creatinine, Ser 0.96; Potassium 3.6; Sodium 136 12/29/2022: Hemoglobin 14.7; Platelets 91   Recent Lipid Panel Lab Results  Component Value Date/Time   CHOL 108 09/29/2022 04:07 PM   TRIG 112 09/29/2022 04:07 PM   HDL 32 (L) 09/29/2022 04:07 PM   CHOLHDL  3.4 09/29/2022 04:07 PM   CHOLHDL 6.8 07/28/2021 05:31 AM   LDLCALC 55 09/29/2022 04:07 PM    Wt Readings from Last 3 Encounters:   01/06/23 206 lb (93.4 kg)  12/27/22 209 lb 7 oz (95 kg)  12/25/22 214 lb (97.1 kg)         Objective:    Vital Signs:  BP (!) 146/69   Pulse 98   Temp (!) 101.3 F (38.5 C) Comment: has COVID currently  Ht '5\' 9"'$  (1.753 m)   Wt 214 lb (97.1 kg)   BMI 31.60 kg/m    VITAL SIGNS:  reviewed  ASSESSMENT & PLAN:    1. Coronary artery disease involving native coronary artery of native heart without angina pectoris   2. Dizziness   3. Essential hypertension   4. Hyperlipidemia, mixed    PLAN:    COVID infection Patient diagnosed recently with COVID. He has cough, congestion, fever, chills. He is taking Paxlovid. He reports low O2 at times and tachycardia. He is in contact with his PCP. We discussed that if O2 remains low (in the 80s), he will need to go to the ER.   Chest pain CAD s/p CABG The patient reports persistent chest pain and SOB. Breathing is much worse in the setting of COVID. Chest pain is pressure like. He does not feel like Ranexa has improved symptoms any. Last cath in October 2023 showed severe 3V CAD with 3/3 patent bypass grafts, CTP mLAD, mid and distal LAD fills from the patent LIMA graft, CTO obtuse marginal branch, patent free RIMA graft to obtuse marginal branch, large dominant RCA with mild diffuse disease, PDA CTO, patent SVG to PDA. Medical management was recommended. An echo has been ordered for SOB. He was unable to tolerate higher Imdur dose due to headaches. Continue Aspirin, Coreg, Zetia, Imdur, Pravasttin. Given active COVID infection we will wait to make any other changes at this time. At follow-up can further discuss symptoms and treatment. Unsure repeat cardiac cath would be helpful.    Dizziness The patient reports dizziness when he is walking. We can check orthostatics at follow-up.   HTN Patient reports stable Bps at home.   HLD LDL 55. Continue Pravastatin and Zetia.   Time:   Today, I have spent 15 minutes with the patient with telehealth  technology discussing the above problems.     Medication Adjustments/Labs and Tests Ordered: Current medicines are reviewed at length with the patient today.  Concerns regarding medicines are outlined above.   Tests Ordered: No orders of the defined types were placed in this encounter.   Medication Changes: No orders of the defined types were placed in this encounter.   Follow Up:  In Person in 2 week(s)  Signed, Darrie Macmillan Ninfa Meeker, PA-C  01/06/2023 2:24 PM    [place]

## 2022-12-25 NOTE — Telephone Encounter (Signed)
  Patient Consent for Virtual Visit        [name] [name] has provided verbal consent on 12/25/2022 for a virtual visit (video or telephone).   CONSENT FOR VIRTUAL VISIT FOR:  [name] [name]  By participating in this virtual visit I agree to the following:  I hereby voluntarily request, consent and authorize [facility] and its employed or contracted physicians, physician assistants, nurse practitioners or other licensed health care professionals (the Practitioner), to provide me with telemedicine health care services (the "Services") as deemed necessary by the treating Practitioner. I acknowledge and consent to receive the Services by the Practitioner via telemedicine. I understand that the telemedicine visit will involve communicating with the Practitioner through live audiovisual communication technology and the disclosure of certain medical information by electronic transmission. I acknowledge that I have been given the opportunity to request an in-person assessment or other available alternative prior to the telemedicine visit and am voluntarily participating in the telemedicine visit.  I understand that I have the right to withhold or withdraw my consent to the use of telemedicine in the course of my care at any time, without affecting my right to future care or treatment, and that the Practitioner or I may terminate the telemedicine visit at any time. I understand that I have the right to inspect all information obtained and/or recorded in the course of the telemedicine visit and may receive copies of available information for a reasonable fee.  I understand that some of the potential risks of receiving the Services via telemedicine include:  Delay or interruption in medical evaluation due to technological equipment failure or disruption; Information transmitted may not be sufficient (e.g. poor resolution of images) to allow for appropriate medical decision making by the  Practitioner; and/or  In rare instances, security protocols could fail, causing a breach of personal health information.  Furthermore, I acknowledge that it is my responsibility to provide information about my medical history, conditions and care that is complete and accurate to the best of my ability. I acknowledge that Practitioner's advice, recommendations, and/or decision may be based on factors not within their control, such as incomplete or inaccurate data provided by me or distortions of diagnostic images or specimens that may result from electronic transmissions. I understand that the practice of medicine is not an exact science and that Practitioner makes no warranties or guarantees regarding treatment outcomes. I acknowledge that a copy of this consent can be made available to me via my patient portal ([place]), or I can request a printed copy by calling the office of [facility].    I understand that my insurance will be billed for this visit.   I have read or had this consent read to me. I understand the contents of this consent, which adequately explains the benefits and risks of the Services being provided via telemedicine.  I have been provided ample opportunity to ask questions regarding this consent and the Services and have had my questions answered to my satisfaction. I give my informed consent for the services to be provided through the use of telemedicine in my medical care

## 2022-12-26 ENCOUNTER — Emergency Department (HOSPITAL_COMMUNITY): Payer: 59

## 2022-12-26 ENCOUNTER — Observation Stay (HOSPITAL_COMMUNITY)
Admission: EM | Admit: 2022-12-26 | Discharge: 2022-12-29 | Disposition: A | Discharge status: Home / Self Care | Payer: 59 | Attending: Family Medicine | Admitting: Family Medicine

## 2022-12-26 ENCOUNTER — Encounter (HOSPITAL_COMMUNITY): Payer: Self-pay

## 2022-12-26 DIAGNOSIS — Z8673 Personal history of transient ischemic attack (TIA), and cerebral infarction without residual deficits: Secondary | ICD-10-CM | POA: Insufficient documentation

## 2022-12-26 DIAGNOSIS — E1165 Type 2 diabetes mellitus with hyperglycemia: Secondary | ICD-10-CM | POA: Insufficient documentation

## 2022-12-26 DIAGNOSIS — J45909 Unspecified asthma, uncomplicated: Secondary | ICD-10-CM | POA: Diagnosis not present

## 2022-12-26 DIAGNOSIS — E785 Hyperlipidemia, unspecified: Secondary | ICD-10-CM | POA: Diagnosis present

## 2022-12-26 DIAGNOSIS — I25118 Atherosclerotic heart disease of native coronary artery with other forms of angina pectoris: Secondary | ICD-10-CM | POA: Diagnosis present

## 2022-12-26 DIAGNOSIS — Z7984 Long term (current) use of oral hypoglycemic drugs: Secondary | ICD-10-CM | POA: Diagnosis not present

## 2022-12-26 DIAGNOSIS — Z79899 Other long term (current) drug therapy: Secondary | ICD-10-CM | POA: Diagnosis not present

## 2022-12-26 DIAGNOSIS — Z7982 Long term (current) use of aspirin: Secondary | ICD-10-CM | POA: Diagnosis not present

## 2022-12-26 DIAGNOSIS — R200 Anesthesia of skin: Secondary | ICD-10-CM | POA: Diagnosis not present

## 2022-12-26 DIAGNOSIS — F419 Anxiety disorder, unspecified: Secondary | ICD-10-CM | POA: Diagnosis present

## 2022-12-26 DIAGNOSIS — Z951 Presence of aortocoronary bypass graft: Secondary | ICD-10-CM | POA: Diagnosis not present

## 2022-12-26 DIAGNOSIS — I351 Nonrheumatic aortic (valve) insufficiency: Secondary | ICD-10-CM | POA: Diagnosis present

## 2022-12-26 DIAGNOSIS — I10 Essential (primary) hypertension: Secondary | ICD-10-CM | POA: Diagnosis not present

## 2022-12-26 DIAGNOSIS — I2510 Atherosclerotic heart disease of native coronary artery without angina pectoris: Secondary | ICD-10-CM | POA: Insufficient documentation

## 2022-12-26 DIAGNOSIS — I25810 Atherosclerosis of coronary artery bypass graft(s) without angina pectoris: Secondary | ICD-10-CM | POA: Diagnosis present

## 2022-12-26 DIAGNOSIS — E1169 Type 2 diabetes mellitus with other specified complication: Secondary | ICD-10-CM

## 2022-12-26 DIAGNOSIS — Z743 Need for continuous supervision: Secondary | ICD-10-CM | POA: Diagnosis not present

## 2022-12-26 DIAGNOSIS — R0789 Other chest pain: Secondary | ICD-10-CM | POA: Diagnosis present

## 2022-12-26 DIAGNOSIS — I2699 Other pulmonary embolism without acute cor pulmonale: Secondary | ICD-10-CM | POA: Diagnosis present

## 2022-12-26 DIAGNOSIS — D696 Thrombocytopenia, unspecified: Secondary | ICD-10-CM | POA: Diagnosis present

## 2022-12-26 DIAGNOSIS — R079 Chest pain, unspecified: Secondary | ICD-10-CM | POA: Diagnosis not present

## 2022-12-26 DIAGNOSIS — I82402 Acute embolism and thrombosis of unspecified deep veins of left lower extremity: Principal | ICD-10-CM | POA: Diagnosis present

## 2022-12-26 DIAGNOSIS — E782 Mixed hyperlipidemia: Secondary | ICD-10-CM | POA: Diagnosis present

## 2022-12-26 DIAGNOSIS — K219 Gastro-esophageal reflux disease without esophagitis: Secondary | ICD-10-CM | POA: Diagnosis present

## 2022-12-26 DIAGNOSIS — U071 COVID-19: Secondary | ICD-10-CM | POA: Diagnosis present

## 2022-12-26 DIAGNOSIS — R2990 Unspecified symptoms and signs involving the nervous system: Secondary | ICD-10-CM | POA: Diagnosis present

## 2022-12-26 DIAGNOSIS — E119 Type 2 diabetes mellitus without complications: Secondary | ICD-10-CM

## 2022-12-26 DIAGNOSIS — D61818 Other pancytopenia: Secondary | ICD-10-CM

## 2022-12-26 DIAGNOSIS — I824Z2 Acute embolism and thrombosis of unspecified deep veins of left distal lower extremity: Secondary | ICD-10-CM

## 2022-12-26 HISTORY — DX: Nonrheumatic aortic (valve) insufficiency: I35.1

## 2022-12-26 LAB — COMPREHENSIVE METABOLIC PANEL
ALT: 24 U/L (ref 0–44)
AST: 19 U/L (ref 15–41)
Albumin: 3.4 g/dL — ABNORMAL LOW (ref 3.5–5.0)
Alkaline Phosphatase: 49 U/L (ref 38–126)
Anion gap: 6 (ref 5–15)
BUN: 15 mg/dL (ref 8–23)
CO2: 22 mmol/L (ref 22–32)
Calcium: 9 mg/dL (ref 8.9–10.3)
Chloride: 107 mmol/L (ref 98–111)
Creatinine, Ser: 1.02 mg/dL (ref 0.61–1.24)
GFR, Estimated: >60 mL/min (ref >60)
Glucose, Bld: 129 mg/dL — ABNORMAL HIGH (ref 70–99)
Potassium: 3.4 mmol/L — ABNORMAL LOW (ref 3.5–5.1)
Sodium: 135 mmol/L (ref 135–145)
Total Bilirubin: 1.1 mg/dL (ref 0.3–1.2)
Total Protein: 7.3 g/dL (ref 6.5–8.1)

## 2022-12-26 LAB — CBC WITH DIFFERENTIAL/PLATELET
Abs Immature Granulocytes: 0.01 10*3/uL (ref 0.00–0.07)
Basophils Absolute: 0 10*3/uL (ref 0.0–0.1)
Basophils Relative: 0 %
Eosinophils Absolute: 0.1 10*3/uL (ref 0.0–0.5)
Eosinophils Relative: 2 %
HCT: 43.1 % (ref 39.0–52.0)
Hemoglobin: 15.1 g/dL (ref 13.0–17.0)
Immature Granulocytes: 0 %
Lymphocytes Relative: 25 %
Lymphs Abs: 1.1 10*3/uL (ref 0.7–4.0)
MCH: 31.5 pg (ref 26.0–34.0)
MCHC: 35 g/dL (ref 30.0–36.0)
MCV: 89.8 fL (ref 80.0–100.0)
Monocytes Absolute: 0.4 10*3/uL (ref 0.1–1.0)
Monocytes Relative: 9 %
Neutro Abs: 2.9 10*3/uL (ref 1.7–7.7)
Neutrophils Relative %: 64 %
Platelets: 77 10*3/uL — ABNORMAL LOW (ref 150–400)
RBC: 4.8 MIL/uL (ref 4.22–5.81)
RDW: 14.2 % (ref 11.5–15.5)
WBC: 4.5 10*3/uL (ref 4.0–10.5)
nRBC: 0 % (ref 0.0–0.2)

## 2022-12-26 LAB — TROPONIN I (HIGH SENSITIVITY): Troponin I (High Sensitivity): 3 ng/L (ref <18)

## 2022-12-26 MED ORDER — HEPARIN (PORCINE) 25000 UT/250ML-% IV SOLN
1600.0000 [IU]/h | INTRAVENOUS | Status: DC
  Administered 2022-12-26 – 2022-12-27 (×3): 1600 [IU]/h via INTRAVENOUS
  Filled 2022-12-26 (×3): qty 250

## 2022-12-26 MED ORDER — HEPARIN BOLUS VIA INFUSION
3000.0000 [IU] | Freq: Once | INTRAVENOUS | Status: AC
  Administered 2022-12-26: 3000 [IU] via INTRAVENOUS

## 2022-12-26 MED ORDER — IOHEXOL 350 MG/ML SOLN
75.0000 mL | Freq: Once | INTRAVENOUS | Status: AC | PRN
  Administered 2022-12-26: 75 mL via INTRAVENOUS

## 2022-12-26 NOTE — ED Provider Notes (Signed)
[facility] Provider Note   CSN: [id number] Arrival date & time: 12/26/22  2058     History {Add pertinent medical, surgical, social history, OB history to HPI:1} Chief Complaint  Patient presents with   Chest Pain   HPI [name] [name] is a 67 y.o. male with CAD status post CABG x 3, hypercholesterolemia, hypertension, and type 2 diabetes presenting for chest pain.  Started 2 weeks ago but was worse tonight.  Located in the middle of his chest at times feels heavy and sharp the pain comes and goes.  At times it can also radiate up his left neck shoulder and down his left arm.  Chest pain is worse with exertion. Also endorses associated shortness of breath.  Denies calf tenderness or recent long trips. Has taken nitroglycerin within the last week for symptoms which has helped. Was recently evaluated for chest pain 4 days ago.  Workup was reassuring.  States he does have a planned echocardiogram and is being followed by cardiology. Left heart cath in October 2023.  Also patient tested positive for COVID at home on Tuesday.  Patient takes a baby aspirin daily but denies any other use of blood thinners.   Chest Pain      Home Medications Prior to Admission medications   Medication Sig Start Date End Date Taking? Authorizing Provider  albuterol (VENTOLIN HFA) 108 (90 Base) MCG/ACT inhaler Inhale 2 puffs into the lungs every 6 (six) hours as needed for wheezing or shortness of breath.    [provider]  aspirin EC 81 MG tablet Take 81 mg by mouth daily. Swallow whole.    [provider]  carvedilol (COREG) 3.125 MG tablet Take 1 tablet (3.125 mg total) by mouth 2 (two) times daily. 10/21/22   [name], MD  citalopram (CELEXA) 20 MG tablet Take 1 tablet (20 mg total) by mouth at bedtime. 10/21/22   [name], MD  Continuous Blood Gluc Receiver (FREESTYLE LIBRE 2 READER) DEVI 1 Device by Does not apply route  continuous. 10/21/22   [name], MD  Continuous Blood Gluc Sensor (FREESTYLE LIBRE 2 SENSOR) MISC Apply one sensor every 14 days 12/11/22   [name], MD  cyclobenzaprine (FLEXERIL) 10 MG tablet Take 1 tablet (10 mg total) by mouth 3 (three) times daily as needed for muscle spasms. 10/21/22   [name], MD  dapagliflozin propanediol (FARXIGA) 5 MG TABS tablet Take 1 tablet (5 mg total) by mouth daily before breakfast. 12/11/22 03/11/23  [name], MD  ezetimibe (ZETIA) 10 MG tablet Take 1 tablet (10 mg total) by mouth daily. 10/21/22   [name], MD  fluticasone (FLONASE) 50 MCG/ACT nasal spray Place 2 sprays into both nostrils 2 (two) times daily. 10/21/22   [name], MD  Fluticasone-Salmeterol (ADVAIR) 250-50 MCG/DOSE AEPB Inhale 1 puff into the lungs every 12 (twelve) hours.    [provider]  gabapentin (NEURONTIN) 300 MG capsule Take 1 capsule (300 mg total) by mouth 3 (three) times daily. Take 300 mg by mouth 3 (three) times daily. 10/21/22   [name], MD  HUMALOG KWIKPEN 100 UNIT/ML KwikPen Inject 5-30 Units into the skin 3 (three) times daily as needed (blood sugar of 200 or higher). Sliding scale 10/21/22   [name], MD  isosorbide mononitrate (IMDUR) 60 MG 24 hr tablet Take one (1) tablet by mouth ( 60 mg ) in the am  and one half tablet ( 30 mg) in the pm. 10/21/22   [name], MD  Melatonin 10 MG TABS Take 10 mg by mouth at bedtime.    [provider]  metFORMIN (GLUCOPHAGE) 1000 MG tablet Take 1 tablet (1,000 mg total) by mouth 2 (two) times daily. 10/21/22   [name], MD  nirmatrelvir/ritonavir (PAXLOVID) 20 x 150 MG & 10 x 100MG TABS Take 3 tablets by mouth 2 (two) times daily for 5 days. (Take nirmatrelvir 150 mg two tablets twice daily for 5 days and ritonavir 100 mg one tablet twice daily for 5 days) Patient GFR is > 60 12/24/22 12/29/22  [name], MD  nitroGLYCERIN (NITROSTAT) 0.4 MG SL tablet  Place 1 tablet (0.4 mg total) under the tongue every 5 (five) minutes x 3 doses as needed for chest pain (if no relief after 2nd dose, proceed to the ED for an evaluation or call 911). Place 0.4 mg under the tongue every 5 (five) minutes x 3 doses as needed for chest pain (if no relief after 2nd dose, proceed to the ED for an evaluation or call 911). 10/21/22   [name], MD  omeprazole (PRILOSEC) 40 MG capsule Take 1 capsule (40 mg total) by mouth daily. 10/21/22   [name], MD  polycarbophil (FIBERCON) 625 MG tablet Take 625 mg by mouth daily.    [provider]  pravastatin (PRAVACHOL) 20 MG tablet Take 1 tablet (20 mg total) by mouth daily. 10/21/22   [name], MD  ranolazine (RANEXA) 500 MG 12 hr tablet Take 1 tablet (500 mg total) by mouth 2 (two) times daily. 12/11/22   Gerrie Nordmann, NP  tirzepatide [facility]) 5 MG/0.5ML Pen Inject 5 mg into the skin once a week. 12/11/22   [name], MD  topiramate (TOPAMAX) 50 MG tablet Take 50 mg by mouth 2 (two) times daily. 11/20/22   [provider]  TRESIBA FLEXTOUCH 100 UNIT/ML FlexTouch Pen Inject 65 Units into the skin in the morning and at bedtime. Inject 60 units in the AM and 70 units in the PM. 10/21/22   [name], MD      Allergies    Divalproex sodium, Statins, Tramadol, Valproic acid, Gadolinium derivatives, and Tricor [fenofibrate]    Review of Systems   Review of Systems  Cardiovascular:  Positive for chest pain.    Physical Exam   Vitals:   12/26/22 2130 12/26/22 2200  BP: 118/76 129/78  Pulse: 83 78  Resp: 19 18  Temp:    SpO2: 95% 95%    CONSTITUTIONAL:  well-appearing, NAD NEURO:  Alert and oriented x 3, CN 3-12 grossly intact EYES:  eyes equal and reactive ENT/NECK:  Supple, no stridor  CARDIO:  regular rate and  rhythm, appears well-perfused  PULM:  No respiratory distress, CTAB GI/GU:  non-distended, soft MSK/SPINE:  No gross deformities, no edema, moves all  extremities  SKIN:  no rash, atraumatic   *Additional and/or pertinent findings included in MDM below    ED Results / Procedures / Treatments   Labs (all labs ordered are listed, but only abnormal results are displayed) Labs Reviewed  CBC WITH DIFFERENTIAL/PLATELET - Abnormal; Notable for the following components:      Result Value   Platelets 77 (*)    All other components within normal limits  COMPREHENSIVE METABOLIC PANEL - Abnormal; Notable for the following components:   Potassium 3.4 (*)    Glucose, Bld  129 (*)    Albumin 3.4 (*)    All other components within normal limits  TROPONIN I (HIGH SENSITIVITY)  TROPONIN I (HIGH SENSITIVITY)    EKG None  Radiology CT Angio Chest PE W and/or Wo Contrast  Result Date: 12/26/2022 CLINICAL DATA:  Pulmonary embolism suspected, high probability. Midsternal chest pain. EXAM: CT ANGIOGRAPHY CHEST WITH CONTRAST TECHNIQUE: Multidetector CT imaging of the chest was performed using the standard protocol during bolus administration of intravenous contrast. Multiplanar CT image reconstructions and MIPs were obtained to evaluate the vascular anatomy. RADIATION DOSE REDUCTION: This exam was performed according to the departmental dose-optimization program which includes automated exposure control, adjustment of the mA and/or kV according to patient size and/or use of iterative reconstruction technique. CONTRAST:  40m OMNIPAQUE IOHEXOL 350 MG/ML SOLN COMPARISON:  12/12/2021. FINDINGS: Cardiovascular: The heart is normal in size and there is no pericardial effusion. Multi-vessel coronary artery calcifications are noted. There is atherosclerotic calcification of the aorta without evidence of aneurysm. The pulmonary trunk is normal in caliber. Small pulmonary artery filling defects are noted in the segmental and subsegmental pulmonary arteries in the right middle lobe. Examination is limited due to respiratory motion artifact. No evidence of right heart  strain. Mediastinum/Nodes: No mediastinal, hilar, or axillary lymphadenopathy by size criteria. Thyroid gland, trachea, and esophagus are within normal limits. Lungs/Pleura: Multifocal scattered ground-glass opacities are noted in the right lung and at the left lung base, greater on the right than on the left. No effusion or pneumothorax. Upper Abdomen: There is fatty infiltration of the liver. The liver has a slightly lobular contour which may be associated with underlying cirrhosis. Gallbladder is surgically absent. The spleen is enlarged, unchanged from prior exams Musculoskeletal: Sternotomy wires are noted. Degenerative changes in the thoracic spine. No acute osseous abnormality. Review of the MIP images confirms the above findings. IMPRESSION: 1. Small segmental and subsegmental pulmonary embolus in the right middle lobe. No evidence of right heart strain. 2. Scattered ground-glass opacities in the right lung and at the left lung base, likely infectious or inflammatory. 3. Hepatic steatosis. The liver has a slightly lobular contour, raising the possibility of underlying cirrhosis. 4. Splenomegaly. 5. Aortic atherosclerosis and coronary artery calcifications. Critical Value/emergent results were called by telephone at the time of interpretation on 12/26/2022 at 10:53 pm to provider JOSEPH ZAMMIT , who verbally acknowledged these results. Electronically Signed   By: LBrett FairyM.D.   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 Cardiac silhouette is unremarkable. No airspace disease, effusion, or pneumothorax. No acute bony abnormality. IMPRESSION: 1. Stable chest, no acute process. Electronically Signed   By: MRanda NgoM.D.   On: 12/26/2022 22:01    Procedures Procedures  {Document cardiac monitor,  telemetry assessment procedure when appropriate:1}  Medications Ordered in ED Medications  iohexol (OMNIPAQUE) 350 MG/ML injection 75 mL (75 mLs Intravenous Contrast Given 12/26/22 2222)    ED Course/ Medical Decision Making/ A&P Clinical Course as of 12/26/22 2258  Sat Dec 26, 2022  2256 Radiologist called and informed me that he has multiple small pulmonary emboli in his right middle lobe but no evidence of right heart strain on CT [JR]    Clinical Course User Index [JR] RHarriet Pho PA-C   {   Click here for  ABCD2, HEART and other calculatorsREFRESH Note before signing :1}                          Medical Decision Making Amount and/or Complexity of Data Reviewed Labs: ordered. Radiology: ordered. ECG/medicine tests: ordered.   Initial Impression and Ddx 67 year old male who is well-appearing and hemodynamically stable presenting for chest pain.  Exam is unremarkable.  DDx includes ACS, CHF exacerbation, PE, and symptoms related to COVID. Patient PMH that increases complexity of ED encounter: CAD status post CABG x 3, hypercholesterolemia, hypertension, and type 2 diabetes   Interpretation of Diagnostics - I independent reviewed and interpreted the labs as followed: Hyperglycemia and mild hypokalemia  - I independently visualized the following imaging with scope of interpretation limited to determining acute life threatening conditions related to emergency care: CT angio of the chest, which revealed small pulmonary emboli in the right middle lobe with no evidence of right heart strain  -Personally reviewed and interpreted EKG which revealed normal sinus rhythm.  Patient Reassessment and Ultimate Disposition/Management   Patient management required discussion with the following services or consulting groups:  {BEROCONSULT:26841}  Complexity of Problems Addressed {BEROCOPA:26833}  Additional Data Reviewed and Analyzed Further history obtained  from: {BERODATA:26834}  Patient Encounter Risk Assessment {BERORISK:26838}   {Document critical care time when appropriate:1} {Document review of labs and clinical decision tools ie heart score, Chads2Vasc2 etc:1}  {Document your independent review of radiology images, and any outside records:1} {Document your discussion with family members, caretakers, and with consultants:1} {Document social determinants of health affecting pt's care:1} {Document your decision making why or why not admission, treatments were needed:1} Final Clinical Impression(s) / ED Diagnoses Final diagnoses:  None    Rx / DC Orders ED Discharge Orders     None

## 2022-12-26 NOTE — Progress Notes (Addendum)
ANTICOAGULATION CONSULT NOTE - Initial Consult  Pharmacy Consult for heparin Indication: pulmonary embolus  Allergies  Allergen Reactions   Divalproex Sodium Other (See Comments)    Causes anger   Statins Other (See Comments)    Muscle aches and cramps   Tramadol Other (See Comments)    Chest pain    Valproic Acid Other (See Comments)    Causes anger   Gadolinium Derivatives Nausea And Vomiting    07/25/19 Pt vomited immediately after IV gad. Denies itching, dyspnea.  (Adverse, not allergic, reaction   Tricor [Fenofibrate] Other (See Comments)    Leg cramps    Patient Measurements: Height: 5' 9"$  (175.3 cm) Weight: 97.1 kg (214 lb) IBW/kg (Calculated) : 70.7 Heparin Dosing Weight: 90kg  Vital Signs: Temp: 99.1 F (37.3 C) (02/17 2105) Temp Source: Oral (02/17 2105) BP: 129/78 (02/17 2200) Pulse Rate: 78 (02/17 2200)  Labs: Recent Labs    12/26/22 2128  HGB 15.1  HCT 43.1  PLT 77*  CREATININE 1.02  TROPONINIHS 3    Estimated Creatinine Clearance: 84.1 mL/min (by C-G formula based on SCr of 1.02 mg/dL).   Medical History: Past Medical History:  Diagnosis Date   Anxiety    Arthritis    Asthma    Cataract    Right eye   Chronic lower back pain    Cirrhosis of liver (HCC)    Coronary atherosclerosis of native coronary artery    a. s/p multiple prior stents, s/p CABG in 01/2016 with LIMA-LAD, Free RIMA-OM2 and SVG-PDA b. 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   LIMA to LAD, free RIMA to OM2, SVG to PDA, open SVG harvest from right thigh   Stroke (HCC)    Thrombocytopenia (HCC)    TIA (transient ischemic attack)    Type 2 diabetes  mellitus ([place])     Assessment: 67yo male recently tested positive for Covid (negative PCR in Epic 4d ago, was flu positive a month ago), now c/o mid-sternal CP, initial troponin negative but CT reveals small PE in RML >> to begin heparin.  Of note pt has known h/o chronic thrombocytopenia, current Plt 77.  Goal of Therapy:  Heparin level 0.3-0.7 units/ml Monitor platelets by anticoagulation protocol: Yes   Plan:  Heparin 3000 units IV bolus x1 followed by infusion at 1600 units/hr. Monitor heparin levels and CBC.  Wynona Neat, PharmD, BCPS  12/26/2022,10:59 PM

## 2022-12-26 NOTE — ED Notes (Signed)
Pt spouse at bedside

## 2022-12-26 NOTE — ED Triage Notes (Signed)
Pt BIB [facility] c/o mid sternal CP that started again today. Pt tested for COVID at home on Tuesday. Pt here recently for CP as well.

## 2022-12-26 NOTE — ED Notes (Signed)
Diabetic freestyle libre removed from pt right upper arm per request of radiology tech prior to chest xray

## 2022-12-26 NOTE — ED Notes (Addendum)
Pt returned from CT, monitoring in place

## 2022-12-26 NOTE — ED Notes (Signed)
Unable to obtain blood from PIV- lab made aware

## 2022-12-26 NOTE — ED Notes (Signed)
ED Provider at bedside. 

## 2022-12-26 NOTE — ED Notes (Signed)
Lab at bedside drawing 2nd trop

## 2022-12-27 ENCOUNTER — Observation Stay (HOSPITAL_COMMUNITY): Payer: 59

## 2022-12-27 ENCOUNTER — Other Ambulatory Visit (HOSPITAL_COMMUNITY): Payer: Self-pay | Admitting: *Deleted

## 2022-12-27 ENCOUNTER — Other Ambulatory Visit: Payer: Self-pay

## 2022-12-27 ENCOUNTER — Observation Stay (HOSPITAL_BASED_OUTPATIENT_CLINIC_OR_DEPARTMENT_OTHER): Payer: 59

## 2022-12-27 DIAGNOSIS — E1100 Type 2 diabetes mellitus with hyperosmolarity without nonketotic hyperglycemic-hyperosmolar coma (NKHHC): Secondary | ICD-10-CM | POA: Diagnosis not present

## 2022-12-27 DIAGNOSIS — F419 Anxiety disorder, unspecified: Secondary | ICD-10-CM | POA: Diagnosis not present

## 2022-12-27 DIAGNOSIS — I82412 Acute embolism and thrombosis of left femoral vein: Secondary | ICD-10-CM | POA: Diagnosis not present

## 2022-12-27 DIAGNOSIS — R079 Chest pain, unspecified: Secondary | ICD-10-CM

## 2022-12-27 DIAGNOSIS — I25118 Atherosclerotic heart disease of native coronary artery with other forms of angina pectoris: Secondary | ICD-10-CM

## 2022-12-27 DIAGNOSIS — Z794 Long term (current) use of insulin: Secondary | ICD-10-CM

## 2022-12-27 DIAGNOSIS — K219 Gastro-esophageal reflux disease without esophagitis: Secondary | ICD-10-CM

## 2022-12-27 DIAGNOSIS — I82402 Acute embolism and thrombosis of unspecified deep veins of left lower extremity: Secondary | ICD-10-CM | POA: Diagnosis not present

## 2022-12-27 DIAGNOSIS — U071 COVID-19: Secondary | ICD-10-CM | POA: Insufficient documentation

## 2022-12-27 DIAGNOSIS — R2990 Unspecified symptoms and signs involving the nervous system: Secondary | ICD-10-CM

## 2022-12-27 DIAGNOSIS — R200 Anesthesia of skin: Secondary | ICD-10-CM | POA: Diagnosis not present

## 2022-12-27 DIAGNOSIS — E785 Hyperlipidemia, unspecified: Secondary | ICD-10-CM | POA: Diagnosis not present

## 2022-12-27 DIAGNOSIS — R531 Weakness: Secondary | ICD-10-CM | POA: Diagnosis not present

## 2022-12-27 DIAGNOSIS — I2699 Other pulmonary embolism without acute cor pulmonale: Secondary | ICD-10-CM | POA: Diagnosis not present

## 2022-12-27 DIAGNOSIS — I2609 Other pulmonary embolism with acute cor pulmonale: Secondary | ICD-10-CM

## 2022-12-27 LAB — HIV ANTIBODY (ROUTINE TESTING W REFLEX): HIV Screen 4th Generation wRfx: NONREACTIVE

## 2022-12-27 LAB — CBC WITH DIFFERENTIAL/PLATELET
Abs Immature Granulocytes: 0.02 10*3/uL (ref 0.00–0.07)
Basophils Absolute: 0 10*3/uL (ref 0.0–0.1)
Basophils Relative: 1 %
Eosinophils Absolute: 0.1 10*3/uL (ref 0.0–0.5)
Eosinophils Relative: 3 %
HCT: 40.1 % (ref 39.0–52.0)
Hemoglobin: 14.2 g/dL (ref 13.0–17.0)
Immature Granulocytes: 1 %
Lymphocytes Relative: 36 %
Lymphs Abs: 1.3 10*3/uL (ref 0.7–4.0)
MCH: 31.5 pg (ref 26.0–34.0)
MCHC: 35.4 g/dL (ref 30.0–36.0)
MCV: 88.9 fL (ref 80.0–100.0)
Monocytes Absolute: 0.3 10*3/uL (ref 0.1–1.0)
Monocytes Relative: 7 %
Neutro Abs: 2 10*3/uL (ref 1.7–7.7)
Neutrophils Relative %: 52 %
Platelets: 77 10*3/uL — ABNORMAL LOW (ref 150–400)
RBC: 4.51 MIL/uL (ref 4.22–5.81)
RDW: 14.1 % (ref 11.5–15.5)
WBC: 3.7 10*3/uL — ABNORMAL LOW (ref 4.0–10.5)
nRBC: 0 % (ref 0.0–0.2)

## 2022-12-27 LAB — GLUCOSE, CAPILLARY
Glucose-Capillary: 128 mg/dL — ABNORMAL HIGH (ref 70–99)
Glucose-Capillary: 138 mg/dL — ABNORMAL HIGH (ref 70–99)
Glucose-Capillary: 148 mg/dL — ABNORMAL HIGH (ref 70–99)
Glucose-Capillary: 222 mg/dL — ABNORMAL HIGH (ref 70–99)

## 2022-12-27 LAB — COMPREHENSIVE METABOLIC PANEL
ALT: 21 U/L (ref 0–44)
AST: 18 U/L (ref 15–41)
Albumin: 3.2 g/dL — ABNORMAL LOW (ref 3.5–5.0)
Alkaline Phosphatase: 47 U/L (ref 38–126)
Anion gap: 7 (ref 5–15)
BUN: 16 mg/dL (ref 8–23)
CO2: 20 mmol/L — ABNORMAL LOW (ref 22–32)
Calcium: 8.8 mg/dL — ABNORMAL LOW (ref 8.9–10.3)
Chloride: 108 mmol/L (ref 98–111)
Creatinine, Ser: 0.91 mg/dL (ref 0.61–1.24)
GFR, Estimated: >60 mL/min (ref >60)
Glucose, Bld: 110 mg/dL — ABNORMAL HIGH (ref 70–99)
Potassium: 3.2 mmol/L — ABNORMAL LOW (ref 3.5–5.1)
Sodium: 135 mmol/L (ref 135–145)
Total Bilirubin: 1 mg/dL (ref 0.3–1.2)
Total Protein: 7 g/dL (ref 6.5–8.1)

## 2022-12-27 LAB — MAGNESIUM: Magnesium: 1.8 mg/dL (ref 1.7–2.4)

## 2022-12-27 LAB — TROPONIN I (HIGH SENSITIVITY): Troponin I (High Sensitivity): 3 ng/L (ref <18)

## 2022-12-27 LAB — HEPARIN LEVEL (UNFRACTIONATED)
Heparin Unfractionated: 0.5 IU/mL (ref 0.30–0.70)
Heparin Unfractionated: 0.42 IU/mL (ref 0.30–0.70)

## 2022-12-27 MED ORDER — ACETAMINOPHEN 650 MG RE SUPP: 650.0000 mg | Freq: Four times a day (QID) | RECTAL | Status: DC | PRN

## 2022-12-27 MED ORDER — MORPHINE SULFATE (PF) 2 MG/ML IV SOLN: 2.0000 mg | INTRAVENOUS | Status: DC | PRN

## 2022-12-27 MED ORDER — GUAIFENESIN-DM 100-10 MG/5ML PO SYRP
5.0000 mL | ORAL_SOLUTION | ORAL | Status: DC | PRN
  Administered 2022-12-27 – 2022-12-29 (×6): 5 mL via ORAL
  Filled 2022-12-27 (×6): qty 5

## 2022-12-27 MED ORDER — CITALOPRAM HYDROBROMIDE 20 MG PO TABS
20.0000 mg | ORAL_TABLET | Freq: Once | ORAL | Status: AC
  Administered 2022-12-27: 20 mg via ORAL
  Filled 2022-12-27: qty 1

## 2022-12-27 MED ORDER — ALBUTEROL SULFATE (2.5 MG/3ML) 0.083% IN NEBU: 2.5000 mg | INHALATION_SOLUTION | Freq: Four times a day (QID) | RESPIRATORY_TRACT | Status: DC | PRN

## 2022-12-27 MED ORDER — ACETAMINOPHEN 325 MG PO TABS
650.0000 mg | ORAL_TABLET | Freq: Four times a day (QID) | ORAL | Status: DC | PRN
  Administered 2022-12-27: 650 mg via ORAL
  Filled 2022-12-27: qty 2

## 2022-12-27 MED ORDER — MAGNESIUM SULFATE 2 GM/50ML IV SOLN
2.0000 g | Freq: Once | INTRAVENOUS | Status: AC
  Administered 2022-12-27: 2 g via INTRAVENOUS
  Filled 2022-12-27: qty 50

## 2022-12-27 MED ORDER — CARVEDILOL 3.125 MG PO TABS
3.1250 mg | ORAL_TABLET | Freq: Two times a day (BID) | ORAL | Status: DC
  Administered 2022-12-27 – 2022-12-29 (×5): 3.125 mg via ORAL
  Filled 2022-12-27 (×5): qty 1

## 2022-12-27 MED ORDER — ONDANSETRON HCL 4 MG/2ML IJ SOLN: 4.0000 mg | Freq: Four times a day (QID) | INTRAMUSCULAR | Status: DC | PRN

## 2022-12-27 MED ORDER — FLUTICASONE PROPIONATE 50 MCG/ACT NA SUSP
2.0000 | Freq: Two times a day (BID) | NASAL | Status: DC
  Administered 2022-12-27 – 2022-12-29 (×4): 2 via NASAL
  Filled 2022-12-27 (×2): qty 16

## 2022-12-27 MED ORDER — ASPIRIN 81 MG PO TBEC
81.0000 mg | DELAYED_RELEASE_TABLET | Freq: Every day | ORAL | Status: DC
  Administered 2022-12-27 – 2022-12-29 (×3): 81 mg via ORAL
  Filled 2022-12-27 (×3): qty 1

## 2022-12-27 MED ORDER — INSULIN ASPART 100 UNIT/ML IJ SOLN
0.0000 [IU] | Freq: Three times a day (TID) | INTRAMUSCULAR | Status: DC
  Administered 2022-12-28 – 2022-12-29 (×3): 3 [IU] via SUBCUTANEOUS

## 2022-12-27 MED ORDER — CITALOPRAM HYDROBROMIDE 20 MG PO TABS
20.0000 mg | ORAL_TABLET | Freq: Every day | ORAL | Status: DC
  Administered 2022-12-27 – 2022-12-28 (×2): 20 mg via ORAL
  Filled 2022-12-27 (×2): qty 1

## 2022-12-27 MED ORDER — ONDANSETRON HCL 4 MG PO TABS: 4.0000 mg | ORAL_TABLET | Freq: Four times a day (QID) | ORAL | Status: DC | PRN

## 2022-12-27 MED ORDER — GABAPENTIN 300 MG PO CAPS
300.0000 mg | ORAL_CAPSULE | Freq: Three times a day (TID) | ORAL | Status: DC
  Administered 2022-12-27 – 2022-12-29 (×7): 300 mg via ORAL
  Filled 2022-12-27 (×7): qty 1

## 2022-12-27 MED ORDER — TOPIRAMATE 25 MG PO TABS
50.0000 mg | ORAL_TABLET | Freq: Two times a day (BID) | ORAL | Status: DC
  Administered 2022-12-27 – 2022-12-29 (×5): 50 mg via ORAL
  Filled 2022-12-27 (×5): qty 2

## 2022-12-27 MED ORDER — PRAVASTATIN SODIUM 10 MG PO TABS
20.0000 mg | ORAL_TABLET | Freq: Every day | ORAL | Status: DC
  Administered 2022-12-27 – 2022-12-29 (×3): 20 mg via ORAL
  Filled 2022-12-27 (×3): qty 2

## 2022-12-27 MED ORDER — ISOSORBIDE MONONITRATE ER 60 MG PO TB24
60.0000 mg | ORAL_TABLET | Freq: Every day | ORAL | Status: DC
  Administered 2022-12-27 – 2022-12-29 (×3): 60 mg via ORAL
  Filled 2022-12-27 (×3): qty 1

## 2022-12-27 MED ORDER — OXYCODONE HCL 5 MG PO TABS
5.0000 mg | ORAL_TABLET | ORAL | Status: DC | PRN
  Administered 2022-12-27: 5 mg via ORAL
  Filled 2022-12-27: qty 1

## 2022-12-27 MED ORDER — INSULIN ASPART 100 UNIT/ML IJ SOLN: 0.0000 [IU] | Freq: Every day | INTRAMUSCULAR | Status: DC

## 2022-12-27 MED ORDER — PERFLUTREN LIPID MICROSPHERE
1.0000 mL | INTRAVENOUS | Status: AC | PRN
  Administered 2022-12-27: 3 mL via INTRAVENOUS

## 2022-12-27 MED ORDER — POTASSIUM CHLORIDE CRYS ER 20 MEQ PO TBCR
40.0000 meq | EXTENDED_RELEASE_TABLET | Freq: Once | ORAL | Status: AC
  Administered 2022-12-27: 40 meq via ORAL
  Filled 2022-12-27: qty 2

## 2022-12-27 MED ORDER — PANTOPRAZOLE SODIUM 40 MG PO TBEC
40.0000 mg | DELAYED_RELEASE_TABLET | Freq: Every day | ORAL | Status: DC
  Administered 2022-12-27 – 2022-12-29 (×3): 40 mg via ORAL
  Filled 2022-12-27 (×3): qty 1

## 2022-12-27 MED ORDER — ALBUTEROL SULFATE HFA 108 (90 BASE) MCG/ACT IN AERS: 2.0000 | INHALATION_SPRAY | Freq: Four times a day (QID) | RESPIRATORY_TRACT | Status: DC | PRN

## 2022-12-27 MED ORDER — MOMETASONE FURO-FORMOTEROL FUM 200-5 MCG/ACT IN AERO
2.0000 | INHALATION_SPRAY | Freq: Two times a day (BID) | RESPIRATORY_TRACT | Status: DC
  Administered 2022-12-27 – 2022-12-29 (×5): 2 via RESPIRATORY_TRACT
  Filled 2022-12-27: qty 8.8

## 2022-12-27 MED ORDER — ISOSORBIDE MONONITRATE ER 60 MG PO TB24
30.0000 mg | ORAL_TABLET | Freq: Every day | ORAL | Status: DC
  Administered 2022-12-27 – 2022-12-28 (×2): 30 mg via ORAL
  Filled 2022-12-27 (×2): qty 1

## 2022-12-27 MED ORDER — MORPHINE SULFATE (PF) 2 MG/ML IV SOLN: 1.0000 mg | INTRAVENOUS | Status: DC | PRN

## 2022-12-27 MED ORDER — INSULIN ASPART 100 UNIT/ML IJ SOLN
3.0000 [IU] | Freq: Three times a day (TID) | INTRAMUSCULAR | Status: DC
  Administered 2022-12-28: 3 [IU] via SUBCUTANEOUS

## 2022-12-27 MED ORDER — EZETIMIBE 10 MG PO TABS
10.0000 mg | ORAL_TABLET | Freq: Every day | ORAL | Status: DC
  Administered 2022-12-27 – 2022-12-29 (×3): 10 mg via ORAL
  Filled 2022-12-27 (×3): qty 1

## 2022-12-27 MED ORDER — NIRMATRELVIR/RITONAVIR (PAXLOVID)TABLET
3.0000 | ORAL_TABLET | Freq: Two times a day (BID) | ORAL | Status: AC
  Administered 2022-12-27 – 2022-12-28 (×3): 3 via ORAL
  Filled 2022-12-27: qty 30

## 2022-12-27 NOTE — H&P (Signed)
History and Physical    Patient: [name] [name] [id number] DOB: 1956/10/04 DOA: 12/26/2022 DOS: the patient was seen and examined on 12/27/2022 PCP: [name], MD  Patient coming from: Home  Chief Complaint:  Chief Complaint  Patient presents with   Chest Pain   HPI: [name] [name] is a 68 y.o. male with medical history significant of anxiety, asthma, cirrhosis of liver, coronary artery disease, hypertension, GERD, hyperlipidemia, history of stroke, history of TIA, diabetes mellitus type 2, and more presents the ED with a chief complaint of chest pain.  Patient reports it has been going on for 2 weeks.  It has been constant.  This is 1 of several visits to the ER.  He describes the pain as substernal.  It waxes and wanes.  It feels sharp when it is at its worst.  There is no worsening of the pain with deep breath or cough.  Patient reports he has not had a cough.  He has had a fever at home with a max of 103.7 on Wednesday.  He reports he took his first COVID test at 2 AM on Wednesday morning.  He was started on Paxlovid on Thursday.  He reports nausea but no vomiting.  He had a decreased appetite.  He reports that he has been hypoglycemic since this is started, and that his PCP has taken him off of his insulin.  They are working on cutting out metformin.  Patient has no personal or family history of blood clot.  Review of systems patient does report that he had some asymmetrical numbness on the left side.  He is not sure if it was weak or not because he did not try to get out of bed when he had it.  He reports it lasted a couple of hours and then resolved.  He has a history of TIAs.  Patient has no other complaints at this time.  Patient is vaccinated for COVID and he is full code. Review of Systems: As mentioned in the history of present illness. All other systems reviewed and are negative. Past Medical History:  Diagnosis Date   Anxiety    Arthritis    Asthma    Cataract     Right eye   Chronic lower back pain    Cirrhosis of liver (HCC)    Coronary atherosclerosis of native coronary artery    a. s/p multiple prior stents, s/p CABG in 01/2016 with LIMA-LAD, Free RIMA-OM2 and SVG-PDA b. 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   LIMA to LAD, free RIMA to OM2, SVG to PDA, open SVG harvest from right thigh   Stroke ([place])    Thrombocytopenia (HCC)    TIA (transient ischemic attack)    Type 2 diabetes mellitus ([place])    Past Surgical History:  Procedure Laterality Date   ANTERIOR CERVICAL DECOMP/DISCECTOMY FUSION  1998   "C3-4"   CARDIAC CATHETERIZATION  "several"   CARDIAC CATHETERIZATION N/A 12/30/2015   Procedure: Left Heart Cath and Coronary Angiography;  Surgeon: [name], MD;  Location: [place];  Service: Cardiovascular;  Laterality: N/A;   CARPAL TUNNEL RELEASE Bilateral 2005   CHOLECYSTECTOMY N/A 11/28/2018   Procedure: LAPAROSCOPIC CHOLECYSTECTOMY;  Surgeon: [name], MD;  Location: [facility];  Service: General;  Laterality: N/A;   COLONOSCOPY     COLONOSCOPY WITH PROPOFOL N/A 09/27/2020   Procedure: COLONOSCOPY WITH PROPOFOL;  Surgeon: [name], MD;  Location: [facility];  Service: Gastroenterology;  Laterality: N/A;   CORONARY ANGIOPLASTY     CORONARY ANGIOPLASTY WITH STENT PLACEMENT  2002; 2003; 11/20/2014   "I have 4 stents after today" (11/20/2014)   CORONARY ARTERY BYPASS GRAFT N/A 01/09/2016   Procedure: CORONARY ARTERY BYPASS GRAFTING (CABG) X 3 UTILIZING RIGHT AND LEFT INTERNAL MAMMARY ARTERY AND ENDOSCOPICALLY HARVESTED SAPHENEOUS VEIN.;  Surgeon: Rexene Alberts, MD;  Location: [facility];  Service: Open Heart Surgery;  Laterality: N/A;   ESOPHAGOGASTRODUODENOSCOPY     ESOPHAGOGASTRODUODENOSCOPY (EGD) WITH PROPOFOL N/A 09/27/2020   Procedure: ESOPHAGOGASTRODUODENOSCOPY (EGD) WITH PROPOFOL;  Surgeon: [name], MD;  [facility]: [facility];  Service: Gastroenterology;  Laterality: N/A;  10   KNEE SURGERY Left 02/2012   "scraped; open"   LEFT HEART CATH AND CORS/GRAFTS ANGIOGRAPHY N/A 08/09/2017   Procedure: LEFT HEART CATH AND CORS/GRAFTS ANGIOGRAPHY;  Surgeon: [name], MD;  Location: [facility];  Service: Cardiovascular;  Laterality: N/A;   LEFT HEART CATH AND CORS/GRAFTS ANGIOGRAPHY N/A 11/22/2018   Procedure: LEFT HEART CATH AND CORS/GRAFTS ANGIOGRAPHY;  Surgeon: [name], [name], MD;  Location: [facility] [facility];  Service: Cardiovascular;  Laterality: N/A;   LEFT HEART CATH AND CORS/GRAFTS ANGIOGRAPHY N/A 08/20/2022   Procedure: LEFT HEART CATH AND CORS/GRAFTS ANGIOGRAPHY;  Surgeon: Burnell Blanks, MD;  Location: [facility];  Service: Cardiovascular;  Laterality: N/A;   LEFT HEART CATHETERIZATION WITH CORONARY ANGIOGRAM N/A 07/20/2012   Procedure: LEFT HEART CATHETERIZATION WITH CORONARY ANGIOGRAM;  Surgeon: [name], MD;  Location: [facility];  Service: Cardiovascular;  Laterality: N/A;   LEFT HEART CATHETERIZATION WITH CORONARY ANGIOGRAM N/A 11/20/2014   Procedure: LEFT HEART CATHETERIZATION WITH CORONARY ANGIOGRAM;  Surgeon: [name], MD;  [facility];  Service: Cardiovascular;  Laterality: N/A;   LEFT HEART CATHETERIZATION WITH CORONARY ANGIOGRAM N/A 11/26/2014   Procedure: LEFT HEART CATHETERIZATION WITH CORONARY ANGIOGRAM;  Surgeon: [name], MD;  Location: [facility];  Service: Cardiovascular;  Laterality: N/A;   NEUROPLASTY / TRANSPOSITION ULNAR NERVE AT ELBOW Right ~ 2012   PERCUTANEOUS CORONARY ROTOBLATOR INTERVENTION (PCI-R)  11/20/2014   Procedure: PERCUTANEOUS CORONARY ROTOBLATOR  INTERVENTION (PCI-R);  Surgeon: [name], MD;  Location: [facility] CATH LAB;  Service: Cardiovascular;;   POSTERIOR CERVICAL LAMINECTOMY Left 04/16/2022   Procedure: Laminectomy and Foraminotomy - left - C6-C7;  Surgeon: Earnie Larsson, MD;  Location: [place];  Service: Neurosurgery;  Laterality: Left;  3C   SHOULDER ARTHROSCOPY Left ~ 2011   TEE WITHOUT CARDIOVERSION N/A 01/09/2016   Procedure: TRANSESOPHAGEAL ECHOCARDIOGRAM (TEE);  Surgeon: Rexene Alberts, MD;  Location: [place];  Service: Open Heart Surgery;  Laterality: N/A;   Social History:  reports that he has never smoked. He has never used smokeless tobacco. He reports that he does not drink alcohol and does not use drugs.  Allergies  Allergen Reactions   Divalproex Sodium Other (See Comments)    Causes anger   Statins Other (See Comments)    Muscle aches and cramps   Tramadol  Other (See Comments)    Chest pain    Valproic Acid Other (See Comments)    Causes anger   Gadolinium Derivatives Nausea And Vomiting    07/25/19 Pt vomited immediately after IV gad. Denies itching, dyspnea.  (Adverse, not allergic, reaction   Tricor [Fenofibrate] Other (See Comments)    Leg cramps    Family History  Problem Relation Age of Onset   Stroke Mother    Coronary artery disease Father 22   Asthma Sister    Multiple sclerosis Brother    Heart disease Paternal Uncle    Stroke Maternal Grandmother    Heart attack Paternal Grandmother    Cancer Paternal Grandfather    Asthma Sister    Seizures Son    Migraines Son    Autism Son    Migraines Son     Prior to Admission medications   Medication Sig Start Date End Date Taking? Authorizing Provider  albuterol (VENTOLIN HFA) 108 (90 Base) MCG/ACT inhaler Inhale 2 puffs into the lungs every 6 (six) hours as needed for wheezing or shortness of breath.    [provider]  aspirin EC 81 MG tablet Take 81 mg by mouth daily. Swallow whole.    [provider]  carvedilol (COREG) 3.125 MG  tablet Take 1 tablet (3.125 mg total) by mouth 2 (two) times daily. 10/21/22   [name], MD  citalopram (CELEXA) 20 MG tablet Take 1 tablet (20 mg total) by mouth at bedtime. 10/21/22   [name], MD  Continuous Blood Gluc Receiver (FREESTYLE LIBRE 2 READER) DEVI 1 Device by Does not apply route continuous. 10/21/22   [name], MD  Continuous Blood Gluc Sensor (FREESTYLE LIBRE 2 SENSOR) MISC Apply one sensor every 14 days 12/11/22   [name], MD  cyclobenzaprine (FLEXERIL) 10 MG tablet Take 1 tablet (10 mg total) by mouth 3 (three) times daily as needed for muscle spasms. 10/21/22   [name], MD  dapagliflozin propanediol (FARXIGA) 5 MG TABS tablet Take 1 tablet (5 mg total) by mouth daily before breakfast. 12/11/22 03/11/23  [name], MD  ezetimibe (ZETIA) 10 MG tablet Take 1 tablet (10 mg total) by mouth daily. 10/21/22   [name], MD  fluticasone (FLONASE) 50 MCG/ACT nasal spray Place 2 sprays into both nostrils 2 (two) times daily. 10/21/22   [name], MD  Fluticasone-Salmeterol (ADVAIR) 250-50 MCG/DOSE AEPB Inhale 1 puff into the lungs every 12 (twelve) hours.    [provider]  gabapentin (NEURONTIN) 300 MG capsule Take 1 capsule (300 mg total) by mouth 3 (three) times daily. Take 300 mg by mouth 3 (three) times daily. 10/21/22   [name], MD  HUMALOG KWIKPEN 100 UNIT/ML KwikPen Inject 5-30 Units into the skin 3 (three) times daily as needed (blood sugar of 200 or higher). Sliding scale 10/21/22   [name], MD  isosorbide mononitrate (IMDUR) 60 MG 24 hr tablet Take one (1) tablet by mouth ( 60 mg ) in the am and one half tablet ( 30 mg) in the pm. 10/21/22   [name], MD  Melatonin 10 MG TABS Take 10 mg by mouth at bedtime.    [provider]  metFORMIN (GLUCOPHAGE) 1000 MG tablet Take 1 tablet (1,000 mg total) by mouth 2 (two) times daily. 10/21/22   [name], MD  nirmatrelvir/ritonavir  (PAXLOVID) 20 x 150 MG & 10 x 100MG TABS Take 3 tablets  by mouth 2 (two) times daily for 5 days. (Take nirmatrelvir 150 mg two tablets twice daily for 5 days and ritonavir 100 mg one tablet twice daily for 5 days) Patient GFR is > 60 12/24/22 12/29/22  [name], MD  nitroGLYCERIN (NITROSTAT) 0.4 MG SL tablet Place 1 tablet (0.4 mg total) under the tongue every 5 (five) minutes x 3 doses as needed for chest pain (if no relief after 2nd dose, proceed to the ED for an evaluation or call 911). Place 0.4 mg under the tongue every 5 (five) minutes x 3 doses as needed for chest pain (if no relief after 2nd dose, proceed to the ED for an evaluation or call 911). 10/21/22   [name], MD  omeprazole (PRILOSEC) 40 MG capsule Take 1 capsule (40 mg total) by mouth daily. 10/21/22   [name], MD  polycarbophil (FIBERCON) 625 MG tablet Take 625 mg by mouth daily.    [provider]  pravastatin (PRAVACHOL) 20 MG tablet Take 1 tablet (20 mg total) by mouth daily. 10/21/22   [name], MD  ranolazine (RANEXA) 500 MG 12 hr tablet Take 1 tablet (500 mg total) by mouth 2 (two) times daily. 12/11/22   Gerrie Nordmann, NP  tirzepatide [facility]) 5 MG/0.5ML Pen Inject 5 mg into the skin once a week. 12/11/22   [name], MD  topiramate (TOPAMAX) 50 MG tablet Take 50 mg by mouth 2 (two) times daily. 11/20/22   [provider]  TRESIBA FLEXTOUCH 100 UNIT/ML FlexTouch Pen Inject 65 Units into the skin in the morning and at bedtime. Inject 60 units in the AM and 70 units in the PM. 10/21/22   [name], MD    Physical Exam: Vitals:   12/26/22 2300 12/27/22 0011 12/27/22 0012 12/27/22 0340  BP: 126/78  135/74 110/66  Pulse: 81  78 79  Resp: (!) 23  18 20  $ Temp:   97.8 F (36.6 C) 97.8 F (36.6 C)  TempSrc:   Oral Oral  SpO2: 95%  98% 95%  Weight:  95 kg    Height:  5' 9"$  (1.753 m)     1.  General: Patient lying supine in bed,  no acute distress   2.  Psychiatric: Alert and oriented x 3, mood and behavior normal for situation, pleasant and cooperative with exam   3. Neurologic: Speech and language are normal, face is symmetric, moves all 4 extremities voluntarily, at baseline without acute deficits on limited exam   4. HEENMT:  Head is atraumatic, normocephalic, pupils reactive to light, neck is supple, trachea is midline, mucous membranes are moist   5. Respiratory : Lungs are clear to auscultation bilaterally without wheezing, rhonchi, rales, no cyanosis, no increase in work of breathing or accessory muscle use   6. Cardiovascular : Heart rate normal, rhythm is regular, no murmurs, rubs or gallops, no peripheral edema, peripheral pulses palpated   7. Gastrointestinal:  Abdomen is soft, nondistended, nontender to palpation bowel sounds active, no masses or organomegaly palpated   8. Skin:  Skin is warm, dry and intact without rashes, acute lesions, or ulcers on limited exam   9.Musculoskeletal:  No acute deformities or trauma, no asymmetry in tone, no peripheral edema, peripheral pulses palpated, no tenderness to palpation in the extremities  Data Reviewed: In the ED Temp 99.1, heart rate 78-89, respiratory rate 18-23, blood pressure 110/72-140/78, satting 95-99% No leukocytosis with a white blood cell count of 4.5, hemoglobin  15.1, platelets 77 Potassium 3.4 and albumin 3.4 Troponin 3, 3 CTA chest shows a PE with no heart strain.  There are infectious versus inflammatory groundglass opacities are likely due to the COVID Chest x-ray is stable EKG shows a heart rate of 90, QTc 453, sinus rhythm Admission was requested for further management of acute PE Assessment and Plan: * Acute pulmonary embolism (HCC) - CTA shows acute pulmonary embolism without heart strain - Heparin started in the ED - Continue heparin - No history of blood clot, no family history of blood clot - Ultrasound DVT bilateral lower extremities - Echo in  the a.m. - Monitor on telemetry  COVID-19 virus infection - Started Paxlovid on Thursday - Continue Paxlovid - No oxygen requirement at this time - Albuterol as needed - Continue to monitor  Coronary artery disease of native artery of native heart with stable angina pectoris (HCC) - Continue aspirin, beta-blocker, Imdur, statin, Ranexa - Monitor on telemetry - Troponin flat and normal - EKG is without acute changes  Stroke-like symptoms - Left-sided numbness that lasted for couple of hours today - CT head pending - Symptoms are completely resolved at this time  GERD (gastroesophageal reflux disease) - Continue PPI  Chest pain - Likely secondary to PE - Troponin 3, 3 which is the same as it was a week ago when he was in the ER - Chest x-ray shows stable exam - CTA shows PE  Hyperlipidemia - Continue Zetia and statin  Anxiety Continue Celexa  Type 2 diabetes mellitus ([place]) - Patient is on [name] and metformin at home - They report that he has been hypoglycemic since being diagnosed with COVID - Holding hypoglycemics at this time      Advance Care Planning:   Code Status: Full Code   Consults: None at this time  Family Communication: Wife at bedside  Severity of Illness: The appropriate patient status for this patient is OBSERVATION. Observation status is judged to be reasonable and necessary in order to provide the required intensity of service to ensure the patient's safety. The patient's presenting symptoms, physical exam findings, and initial radiographic and laboratory data in the context of their medical condition is felt to place them at decreased risk for further clinical deterioration. Furthermore, it is anticipated that the patient will be medically stable for discharge from the hospital within 2 midnights of admission.   Author: Rolla Plate, DO 12/27/2022 3:47 AM  For on call review [email or web address].

## 2022-12-27 NOTE — Assessment & Plan Note (Signed)
-   Left-sided numbness that lasted for couple of hours today - CT head pending - Symptoms are completely resolved at this time

## 2022-12-27 NOTE — Assessment & Plan Note (Signed)
Continue PPI ?

## 2022-12-27 NOTE — Assessment & Plan Note (Signed)
-   Continue aspirin, beta-blocker, Imdur, statin, Ranexa - Monitor on telemetry - Troponin flat and normal - EKG is without acute changes - checked with cardiology service to see if it will be recommended for him to continue aspirin while on apixaban in setting of chronic thrombocytopenia --the recommendation was to stop aspirin

## 2022-12-27 NOTE — Progress Notes (Signed)
ANTICOAGULATION CONSULT NOTE - Initial Consult  Pharmacy Consult for heparin Indication: pulmonary embolus  Allergies  Allergen Reactions   Divalproex Sodium Other (See Comments)    Causes anger   Statins Other (See Comments)    Muscle aches and cramps   Tramadol Other (See Comments)    Chest pain    Valproic Acid Other (See Comments)    Causes anger   Gadolinium Derivatives Nausea And Vomiting    07/25/19 Pt vomited immediately after IV gad. Denies itching, dyspnea.  (Adverse, not allergic, reaction   Tricor [Fenofibrate] Other (See Comments)    Leg cramps    Patient Measurements: Height: 5' 9"$  (175.3 cm) Weight: 95 kg (209 lb 7 oz) IBW/kg (Calculated) : 70.7 Heparin Dosing Weight: 90kg  Vital Signs: Temp: 97.8 F (36.6 C) (02/18 0340) Temp Source: Oral (02/18 0340) BP: 110/66 (02/18 0340) Pulse Rate: 79 (02/18 0340)  Labs: Recent Labs    12/26/22 2128 12/26/22 2305 12/27/22 0649  HGB 15.1  --  14.2  HCT 43.1  --  40.1  PLT 77*  --  77*  HEPARINUNFRC  --   --  0.50  CREATININE 1.02  --  0.91  TROPONINIHS 3 3  --      Estimated Creatinine Clearance: 93.3 mL/min (by C-G formula based on SCr of 0.91 mg/dL).   Medical History: Past Medical History:  Diagnosis Date   Anxiety    Arthritis    Asthma    Cataract    Right eye   Chronic lower back pain    Cirrhosis of liver (HCC)    Coronary atherosclerosis of native coronary artery    a. s/p multiple prior stents, s/p CABG in 01/2016 with LIMA-LAD, Free RIMA-OM2 and SVG-PDA b. 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  Of note pt has known h/o chronic thrombocytopenia, current Plt 77.  Goal of Therapy:  Heparin level 0.3-0.7 units/ml Monitor platelets by anticoagulation protocol: Yes   Plan:  Continue Heparin infusion at 1600 units/hr. Recheck confirmatory level in 8 hrs Monitor heparin levels and CBC.  Hart Robinsons, PharmD Clinical Pharmacist  12/27/2022,7:42 AM

## 2022-12-27 NOTE — Progress Notes (Signed)
ANTICOAGULATION CONSULT NOTE - Initial Consult  Pharmacy Consult for heparin Indication: pulmonary embolus  Allergies  Allergen Reactions   Divalproex Sodium Other (See Comments)    Causes anger   Statins Other (See Comments)    Muscle aches and cramps   Tramadol Other (See Comments)    Chest pain    Valproic Acid Other (See Comments)    Causes anger   Gadolinium Derivatives Nausea And Vomiting    07/25/19 Pt vomited immediately after IV gad. Denies itching, dyspnea.  (Adverse, not allergic, reaction   Tricor [Fenofibrate] Other (See Comments)    Leg cramps    Patient Measurements: Height: 5' 9"$  (175.3 cm) Weight: 95 kg (209 lb 7 oz) IBW/kg (Calculated) : 70.7 Heparin Dosing Weight: 90kg  Vital Signs: Temp: 98.1 F (36.7 C) (02/18 1138) Temp Source: Oral (02/18 1138) BP: 140/71 (02/18 1138) Pulse Rate: 73 (02/18 1138)  Labs: Recent Labs    12/26/22 2128 12/26/22 2305 12/27/22 0649 12/27/22 1444  HGB 15.1  --  14.2  --   HCT 43.1  --  40.1  --   PLT 77*  --  77*  --   HEPARINUNFRC  --   --  0.50 0.42  CREATININE 1.02  --  0.91  --   TROPONINIHS 3 3  --   --      Estimated Creatinine Clearance: 93.3 mL/min (by C-G formula based on SCr of 0.91 mg/dL).   Medical History: Past Medical History:  Diagnosis Date   Anxiety    Arthritis    Asthma    Cataract    Right eye   Chronic lower back pain    Cirrhosis of liver (HCC)    Coronary atherosclerosis of native coronary artery    a. s/p multiple prior stents, s/p CABG in 01/2016 with LIMA-LAD, Free RIMA-OM2 and SVG-PDA b. 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  Of note pt has known h/o chronic thrombocytopenia, current Plt 77.  Heparin level therapeutic x 2.  Goal of Therapy:  Heparin level 0.3-0.7 units/ml Monitor platelets by anticoagulation protocol: Yes   Plan:  Continue Heparin infusion at 1600 units/hr. Heparin level daily Monitor heparin levels and CBC.  Hart Robinsons, PharmD Clinical Pharmacist  12/27/2022,3:17 PM

## 2022-12-27 NOTE — Assessment & Plan Note (Signed)
-   Likely secondary to PE - Troponin 3, 3 which is the same as it was a week ago when he was in the ER - Chest x-ray shows stable exam - CTA shows PE; chest pain symptoms slowly improving

## 2022-12-27 NOTE — Progress Notes (Signed)
*  PRELIMINARY RESULTS* Echocardiogram 2D Echocardiogram has been performed with Definity.  Samuel Germany 12/27/2022, 2:38 PM

## 2022-12-27 NOTE — Progress Notes (Signed)
Pt A&O x4. Patient ambulated from stretcher to bed without difficulty. Blood glucose 148.

## 2022-12-27 NOTE — Progress Notes (Signed)
ASSUMPTION OF CARE NOTE   12/27/2022 11:52 AM  [name] was seen and examined.  The H&P by the admitting provider, orders, imaging was reviewed.  Please see new orders.  Will continue to follow.   Vitals:   12/27/22 0834 12/27/22 1138  BP: (!) 140/78 (!) 140/71  Pulse: 70 73  Resp: 20 20  Temp: 97.8 F (36.6 C) 98.1 F (36.7 C)  SpO2: 96% 97%    Results for orders placed or performed during the hospital encounter of 12/26/22  CBC with Differential  Result Value Ref Range   WBC 4.5 4.0 - 10.5 K/uL   RBC 4.80 4.22 - 5.81 MIL/uL   Hemoglobin 15.1 13.0 - 17.0 g/dL   HCT 43.1 39.0 - 52.0 %   MCV 89.8 80.0 - 100.0 fL   MCH 31.5 26.0 - 34.0 pg   MCHC 35.0 30.0 - 36.0 g/dL   RDW 14.2 11.5 - 15.5 %   Platelets 77 (L) 150 - 400 K/uL   nRBC 0.0 0.0 - 0.2 %   Neutrophils Relative % 64 %   Neutro Abs 2.9 1.7 - 7.7 K/uL   Lymphocytes Relative 25 %   Lymphs Abs 1.1 0.7 - 4.0 K/uL   Monocytes Relative 9 %   Monocytes Absolute 0.4 0.1 - 1.0 K/uL   Eosinophils Relative 2 %   Eosinophils Absolute 0.1 0.0 - 0.5 K/uL   Basophils Relative 0 %   Basophils Absolute 0.0 0.0 - 0.1 K/uL   Immature Granulocytes 0 %   Abs Immature Granulocytes 0.01 0.00 - 0.07 K/uL  Comprehensive metabolic panel  Result Value Ref Range   Sodium 135 135 - 145 mmol/L   Potassium 3.4 (L) 3.5 - 5.1 mmol/L   Chloride 107 98 - 111 mmol/L   CO2 22 22 - 32 mmol/L   Glucose, Bld 129 (H) 70 - 99 mg/dL   BUN 15 8 - 23 mg/dL   Creatinine, Ser 1.02 0.61 - 1.24 mg/dL   Calcium 9.0 8.9 - 10.3 mg/dL   Total Protein 7.3 6.5 - 8.1 g/dL   Albumin 3.4 (L) 3.5 - 5.0 g/dL   AST 19 15 - 41 U/L   ALT 24 0 - 44 U/L   Alkaline Phosphatase 49 38 - 126 U/L   Total Bilirubin 1.1 0.3 - 1.2 mg/dL   GFR, Estimated >60 >60 mL/min   Anion gap 6 5 - 15  Heparin level (unfractionated)  Result Value Ref Range   Heparin Unfractionated 0.50 0.30 - 0.70 IU/mL  Glucose, capillary  Result Value Ref Range   Glucose-Capillary 148 (H)  70 - 99 mg/dL  Comprehensive metabolic panel  Result Value Ref Range   Sodium 135 135 - 145 mmol/L   Potassium 3.2 (L) 3.5 - 5.1 mmol/L   Chloride 108 98 - 111 mmol/L   CO2 20 (L) 22 - 32 mmol/L   Glucose, Bld 110 (H) 70 - 99 mg/dL   BUN 16 8 - 23 mg/dL   Creatinine, Ser 0.91 0.61 - 1.24 mg/dL   Calcium 8.8 (L) 8.9 - 10.3 mg/dL   Total Protein 7.0 6.5 - 8.1 g/dL   Albumin 3.2 (L) 3.5 - 5.0 g/dL   AST 18 15 - 41 U/L   ALT 21 0 - 44 U/L   Alkaline Phosphatase 47 38 - 126 U/L   Total Bilirubin 1.0 0.3 - 1.2 mg/dL   GFR, Estimated >60 >60 mL/min   Anion gap 7 5 - 15  Magnesium  Result Value Ref Range   Magnesium 1.8 1.7 - 2.4 mg/dL  CBC with Differential/Platelet  Result Value Ref Range   WBC 3.7 (L) 4.0 - 10.5 K/uL   RBC 4.51 4.22 - 5.81 MIL/uL   Hemoglobin 14.2 13.0 - 17.0 g/dL   HCT 40.1 39.0 - 52.0 %   MCV 88.9 80.0 - 100.0 fL   MCH 31.5 26.0 - 34.0 pg   MCHC 35.4 30.0 - 36.0 g/dL   RDW 14.1 11.5 - 15.5 %   Platelets 77 (L) 150 - 400 K/uL   nRBC 0.0 0.0 - 0.2 %   Neutrophils Relative % 52 %   Neutro Abs 2.0 1.7 - 7.7 K/uL   Lymphocytes Relative 36 %   Lymphs Abs 1.3 0.7 - 4.0 K/uL   Monocytes Relative 7 %   Monocytes Absolute 0.3 0.1 - 1.0 K/uL   Eosinophils Relative 3 %   Eosinophils Absolute 0.1 0.0 - 0.5 K/uL   Basophils Relative 1 %   Basophils Absolute 0.0 0.0 - 0.1 K/uL   Immature Granulocytes 1 %   Abs Immature Granulocytes 0.02 0.00 - 0.07 K/uL  Glucose, capillary  Result Value Ref Range   Glucose-Capillary 128 (H) 70 - 99 mg/dL  Troponin I (High Sensitivity)  Result Value Ref Range   Troponin I (High Sensitivity) 3 <18 ng/L  Troponin I (High Sensitivity)  Result Value Ref Range   Troponin I (High Sensitivity) 3 <18 ng/L     C. Wynetta Emery, MD [facility]   12/26/2022  8:59 PM How to contact the [facility] Attending or Consulting provider [place] or covering provider during after hours [place], for this patient?  Check the care team in [facility] and look for  a) attending/consulting TR[facility] provider listed and b) the [facility] team listed Log into [email or web address] and use [place]'s universal password to access. If you do not have the password, please contact the hospital operator. Locate the [facility] provider you are looking for under [facility] and page to a number that you can be directly reached. If you still have difficulty reaching the provider, please page the [facility] (Director on Call) for the Hospitalists listed on amion for assistance.

## 2022-12-27 NOTE — Assessment & Plan Note (Signed)
-   Patient is on [name] and metformin at home - They report that he has been hypoglycemic since being diagnosed with COVID - resume home treatment regimen at discharge; hypoglycemia precautions ordered CBG (last 3)  Recent Labs    12/28/22 2039 12/29/22 0314 12/29/22 0731  GLUCAP 167* 140* 172*

## 2022-12-27 NOTE — Assessment & Plan Note (Signed)
-   Continue Zetia and statin

## 2022-12-27 NOTE — Assessment & Plan Note (Signed)
-   CTA shows acute pulmonary embolism without heart strain - Heparin started in the ED - Continue heparin - No history of blood clot, no family history of blood clot - Ultrasound DVT bilateral lower extremities - Echo in the a.m. - Monitor on telemetry

## 2022-12-27 NOTE — Assessment & Plan Note (Signed)
Continue Celexa

## 2022-12-27 NOTE — Assessment & Plan Note (Signed)
-   Started Paxlovid on Thursday - completed Paxlovid 2/19 - he cannot take this with apixaban  - No oxygen requirement at this time - Albuterol as needed - Continue to monitor

## 2022-12-28 ENCOUNTER — Ambulatory Visit (HOSPITAL_COMMUNITY): Payer: 59

## 2022-12-28 ENCOUNTER — Other Ambulatory Visit (HOSPITAL_COMMUNITY): Payer: Self-pay

## 2022-12-28 ENCOUNTER — Encounter (HOSPITAL_COMMUNITY): Payer: Self-pay | Admitting: Hematology

## 2022-12-28 DIAGNOSIS — I2699 Other pulmonary embolism without acute cor pulmonale: Secondary | ICD-10-CM | POA: Diagnosis not present

## 2022-12-28 DIAGNOSIS — I824Z2 Acute embolism and thrombosis of unspecified deep veins of left distal lower extremity: Secondary | ICD-10-CM

## 2022-12-28 DIAGNOSIS — D696 Thrombocytopenia, unspecified: Secondary | ICD-10-CM | POA: Diagnosis not present

## 2022-12-28 DIAGNOSIS — E1100 Type 2 diabetes mellitus with hyperosmolarity without nonketotic hyperglycemic-hyperosmolar coma (NKHHC): Secondary | ICD-10-CM | POA: Diagnosis not present

## 2022-12-28 DIAGNOSIS — I82402 Acute embolism and thrombosis of unspecified deep veins of left lower extremity: Secondary | ICD-10-CM | POA: Diagnosis present

## 2022-12-28 DIAGNOSIS — E782 Mixed hyperlipidemia: Secondary | ICD-10-CM | POA: Diagnosis not present

## 2022-12-28 DIAGNOSIS — I25118 Atherosclerotic heart disease of native coronary artery with other forms of angina pectoris: Secondary | ICD-10-CM | POA: Diagnosis not present

## 2022-12-28 DIAGNOSIS — Z794 Long term (current) use of insulin: Secondary | ICD-10-CM | POA: Diagnosis not present

## 2022-12-28 DIAGNOSIS — R0789 Other chest pain: Secondary | ICD-10-CM | POA: Diagnosis not present

## 2022-12-28 DIAGNOSIS — I351 Nonrheumatic aortic (valve) insufficiency: Secondary | ICD-10-CM | POA: Diagnosis not present

## 2022-12-28 DIAGNOSIS — K219 Gastro-esophageal reflux disease without esophagitis: Secondary | ICD-10-CM | POA: Diagnosis not present

## 2022-12-28 LAB — CBC
HCT: 39.5 % (ref 39.0–52.0)
Hemoglobin: 14 g/dL (ref 13.0–17.0)
MCH: 31.2 pg (ref 26.0–34.0)
MCHC: 35.4 g/dL (ref 30.0–36.0)
MCV: 88 fL (ref 80.0–100.0)
Platelets: 76 10*3/uL — ABNORMAL LOW (ref 150–400)
RBC: 4.49 MIL/uL (ref 4.22–5.81)
RDW: 13.8 % (ref 11.5–15.5)
WBC: 2.7 10*3/uL — ABNORMAL LOW (ref 4.0–10.5)
nRBC: 0 % (ref 0.0–0.2)

## 2022-12-28 LAB — ECHOCARDIOGRAM COMPLETE
Area-P 1/2: 2.56 cm2
Height: 69 in
P 1/2 time: 478 msec
S' Lateral: 3 cm
Weight: 3350.99 oz

## 2022-12-28 LAB — BASIC METABOLIC PANEL
Anion gap: 10 (ref 5–15)
BUN: 15 mg/dL (ref 8–23)
CO2: 20 mmol/L — ABNORMAL LOW (ref 22–32)
Calcium: 8.6 mg/dL — ABNORMAL LOW (ref 8.9–10.3)
Chloride: 106 mmol/L (ref 98–111)
Creatinine, Ser: 0.96 mg/dL (ref 0.61–1.24)
GFR, Estimated: >60 mL/min (ref >60)
Glucose, Bld: 145 mg/dL — ABNORMAL HIGH (ref 70–99)
Potassium: 3.6 mmol/L (ref 3.5–5.1)
Sodium: 136 mmol/L (ref 135–145)

## 2022-12-28 LAB — GLUCOSE, CAPILLARY
Glucose-Capillary: 167 mg/dL — ABNORMAL HIGH (ref 70–99)
Glucose-Capillary: 155 mg/dL — ABNORMAL HIGH (ref 70–99)
Glucose-Capillary: 196 mg/dL — ABNORMAL HIGH (ref 70–99)
Glucose-Capillary: 170 mg/dL — ABNORMAL HIGH (ref 70–99)
Glucose-Capillary: 167 mg/dL — ABNORMAL HIGH (ref 70–99)

## 2022-12-28 LAB — HEPARIN LEVEL (UNFRACTIONATED): Heparin Unfractionated: 0.63 IU/mL (ref 0.30–0.70)

## 2022-12-28 MED ORDER — APIXABAN 5 MG PO TABS
10.0000 mg | ORAL_TABLET | Freq: Two times a day (BID) | ORAL | Status: DC
  Administered 2022-12-28 – 2022-12-29 (×3): 10 mg via ORAL
  Filled 2022-12-28 (×3): qty 2

## 2022-12-28 MED ORDER — APIXABAN 5 MG PO TABS: 5.0000 mg | ORAL_TABLET | Freq: Two times a day (BID) | ORAL | Status: DC

## 2022-12-28 NOTE — Discharge Instructions (Signed)
Information on my medicine - ELIQUIS (apixaban)  Why was Eliquis prescribed for you? Eliquis was prescribed to treat blood clots that may have been found in the veins of your legs (deep vein thrombosis) or in your lungs (pulmonary embolism) and to reduce the risk of them occurring again.  What do You need to know about Eliquis ? The starting dose is 10 mg (two 5 mg tablets) taken TWICE daily for the FIRST SEVEN (7) DAYS, then on 01/04/23  the dose is reduced to ONE 5 mg tablet taken TWICE daily.  Eliquis may be taken with or without food.   Try to take the dose about the same time in the morning and in the evening. If you have difficulty swallowing the tablet whole please discuss with your pharmacist how to take the medication safely.  Take Eliquis exactly as prescribed and DO NOT stop taking Eliquis without talking to the doctor who prescribed the medication.  Stopping may increase your risk of developing a new blood clot.  Refill your prescription before you run out.  After discharge, you should have regular check-up appointments with your healthcare provider that is prescribing your Eliquis.    What do you do if you miss a dose? If a dose of ELIQUIS is not taken at the scheduled time, take it as soon as possible on the same day and twice-daily administration should be resumed. The dose should not be doubled to make up for a missed dose.  Important Safety Information A possible side effect of Eliquis is bleeding. You should call your healthcare provider right away if you experience any of the following: Bleeding from an injury or your nose that does not stop. Unusual colored urine (red or dark brown) or unusual colored stools (red or black). Unusual bruising for unknown reasons. A serious fall or if you hit your head (even if there is no bleeding).  Some medicines may interact with Eliquis and might increase your risk of bleeding or clotting while on Eliquis. To help avoid this,  consult your healthcare provider or pharmacist prior to using any new prescription or non-prescription medications, including herbals, vitamins, non-steroidal anti-inflammatory drugs (NSAIDs) and supplements.  This website has more information on Eliquis (apixaban): http://www.eliquis.com/eliquis/home

## 2022-12-28 NOTE — Hospital Course (Signed)
67 y.o. male with medical history significant of anxiety, asthma, cirrhosis of liver, coronary artery disease, hypertension, GERD, hyperlipidemia, history of stroke, history of TIA, diabetes mellitus type 2, and more presents the ED with a chief complaint of chest pain.  Patient reports it has been going on for 2 weeks.  It has been constant.  This is 1 of several visits to the ER.  He describes the pain as substernal.  It waxes and wanes.  It feels sharp when it is at its worst.  There is no worsening of the pain with deep breath or cough.  Patient reports he has not had a cough.  He has had a fever at home with a max of 103.7 on Wednesday.  He reports he took his first COVID test at 2 AM on Wednesday morning.  He was started on Paxlovid on Thursday.  He reports nausea but no vomiting.  He had a decreased appetite.  He reports that he has been hypoglycemic since this is started, and that his PCP has taken him off of his insulin.  They are working on cutting out metformin.  Patient has no personal or family history of blood clot.  Review of systems patient does report that he had some asymmetrical numbness on the left side.  He is not sure if it was weak or not because he did not try to get out of bed when he had it.  He reports it lasted a couple of hours and then resolved.  He has a history of TIAs.  Patient has no other complaints at this time.   Patient is vaccinated for COVID and he is full code.

## 2022-12-28 NOTE — Assessment & Plan Note (Signed)
-   Added TED hose to bilateral lower extremities to try to prevent post thrombotic phlebitis

## 2022-12-28 NOTE — TOC Benefit Eligibility Note (Signed)
Patient Advocate Encounter  Insurance verification completed.    The patient is currently admitted and upon discharge could be taking Eliquis 5 mg.  The current 30 day co-pay is $0.00.   The patient is insured through [employer] Medicare Part D   Blair Mesina, CPHT Pharmacy Patient Advocate Specialist [facility] Patient Advocate Team Direct Number: [phone]  Fax: [phone]       

## 2022-12-28 NOTE — Progress Notes (Signed)
[place] for heparin>>apixaban Indication: pulmonary embolus  Allergies  Allergen Reactions   Divalproex Sodium Other (See Comments)    Causes anger   Statins Other (See Comments)    Muscle aches and cramps   Tramadol Other (See Comments)    Chest pain    Valproic Acid Other (See Comments)    Causes anger   Gadolinium Derivatives Nausea And Vomiting    07/25/19 Pt vomited immediately after IV gad. Denies itching, dyspnea.  (Adverse, not allergic, reaction   Tricor [Fenofibrate] Other (See Comments)    Leg cramps    Patient Measurements: Height: 5' 9"$  (175.3 cm) Weight: 95 kg (209 lb 7 oz) IBW/kg (Calculated) : 70.7 Heparin Dosing Weight: 90kg  Vital Signs: Temp: 97.6 F (36.4 C) (02/19 0322) Temp Source: Oral (02/19 0322) BP: 129/76 (02/19 0322) Pulse Rate: 69 (02/19 0813)  Labs: Recent Labs    12/26/22 2128 12/26/22 2305 12/27/22 0649 12/27/22 1444 12/28/22 0432 12/28/22 0748  HGB 15.1  --  14.2  --  14.0  --   HCT 43.1  --  40.1  --  39.5  --   PLT 77*  --  77*  --  76*  --   HEPARINUNFRC  --   --  0.50 0.42 0.63  --   CREATININE 1.02  --  0.91  --   --  0.96  TROPONINIHS 3 3  --   --   --   --      Estimated Creatinine Clearance: 88.4 mL/min (by C-G formula based on SCr of 0.96 mg/dL).   Medical History: Past Medical History:  Diagnosis Date   Anxiety    Arthritis    Asthma    Cataract    Right eye   Chronic lower back pain    Cirrhosis of liver (HCC)    Coronary atherosclerosis of native coronary artery    a. s/p multiple prior stents, s/p CABG in 01/2016 with LIMA-LAD, Free RIMA-OM2 and SVG-PDA b. 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  Of note pt has known h/o chronic thrombocytopenia.   New orders received today to transition to apixaban. Hemoglobin has been stable in 14s, platelet count is low but stable in 70s (noted history of thrombocytopenia). No bleeding issues noted. Will turn off heparin infusion once apixaban is given.   Goal of Therapy:  Heparin level 0.3-0.7 units/ml Monitor platelets by anticoagulation protocol: Yes   Plan:  Apixaban 22m bid x 7 days then 576mbid Copay is $0 Pharmacy will provide copay card and  education prior to discharge  Erin Hearing PharmD., BCPS Clinical Pharmacist 12/28/2022 8:28 AM

## 2022-12-28 NOTE — Progress Notes (Signed)
  Transition of Care [facility]) Screening Note   Patient Details  Name: [name] [name] Date of Birth: May 23, 1956   Transition of Care [facility]) CM/SW Contact:    Ihor Gully, LCSW Phone Number: 12/28/2022, 1:21 PM    Transition of Care Department [facility]) has reviewed patient and no TOC needs have been identified at this time. We will continue to monitor patient advancement through interdisciplinary progression rounds. If new patient transition needs arise, please place a TOC consult.

## 2022-12-28 NOTE — Care Management Obs Status (Signed)
[place] NOTIFICATION   Patient Details  Name: [name] [name] MRN: [id number] Date of Birth: August 09, 1956   Medicare Observation Status Notification Given:  Yes  Copy mailed to address on file  Tommy Medal 12/28/2022, 10:55 AM

## 2022-12-28 NOTE — Assessment & Plan Note (Addendum)
-   Continue pravastatin 20 mg daily

## 2022-12-28 NOTE — Progress Notes (Signed)
PROGRESS NOTE   [name] [name]  [id number] DOB: August 29, 1956 DOA: 12/26/2022 PCP: [name], MD   Chief Complaint  Patient presents with   Chest Pain   Level of care: Telemetry  Brief Admission History:   67 y.o. male with medical history significant of anxiety, asthma, cirrhosis of liver, coronary artery disease, hypertension, GERD, hyperlipidemia, history of stroke, history of TIA, diabetes mellitus type 2, and more presents the ED with a chief complaint of chest pain.  Patient reports it has been going on for 2 weeks.  It has been constant.  This is 1 of several visits to the ER.  He describes the pain as substernal.  It waxes and wanes.  It feels sharp when it is at its worst.  There is no worsening of the pain with deep breath or cough.  Patient reports he has not had a cough.  He has had a fever at home with a max of 103.7 on Wednesday.  He reports he took his first COVID test at 2 AM on Wednesday morning.  He was started on Paxlovid on Thursday.  He reports nausea but no vomiting.  He had a decreased appetite.  He reports that he has been hypoglycemic since this is started, and that his PCP has taken him off of his insulin.  They are working on cutting out metformin.  Patient has no personal or family history of blood clot.  Review of systems patient does report that he had some asymmetrical numbness on the left side.  He is not sure if it was weak or not because he did not try to get out of bed when he had it.  He reports it lasted a couple of hours and then resolved.  He has a history of TIAs.  Patient has no other complaints at this time.   Patient is vaccinated for COVID and he is full code.   Assessment and Plan: * Acute pulmonary embolism ([place]) - CTA shows acute pulmonary embolism without heart strain - Heparin IV for 24 hours given  - DC heparin and start oral apixaban  - No history of blood clot, no family history of blood clot - Ultrasound DVT bilateral lower  extremities with DVT left leg found  - Echo with no right heart strain - Monitor on telemetry  Left leg DVT ([place]) - Added TED hose to bilateral lower extremities to try to prevent post thrombotic phlebitis  COVID-19 virus infection - Started Paxlovid on Thursday - completed Paxlovid 2/19 - he cannot take this with apixaban  - No oxygen requirement at this time - Albuterol as needed - Continue to monitor  Coronary artery disease of native artery of native heart with stable angina pectoris (HCC) - Continue aspirin, beta-blocker, Imdur, statin, Ranexa - Monitor on telemetry - Troponin flat and normal - EKG is without acute changes - check with cardiology service to see if it will be recommended for him to continue aspirin while on apixaban in setting of chronic thrombocytopenia   Stroke-like symptoms - Left-sided numbness that lasted for couple of hours today - CT head no acute findings - Symptoms are completely resolved at this time  GERD (gastroesophageal reflux disease) - Continue PPI  Chest pain - Likely secondary to PE - Troponin 3, 3 which is the same as it was a week ago when he was in the ER - Chest x-ray shows stable exam - CTA shows PE; chest pain symptoms slowly improving  Hyperlipidemia - Continue  Zetia and statin  Thrombocytopenia (HCC) - no bleeding events so far, monitor for bleeding complications - check with cardiology team regarding continuation of aspirin with apixaban  Anxiety Continue Celexa  Type 2 diabetes mellitus ([place]) - Patient is on [name] and metformin at home - They report that he has been hypoglycemic since being diagnosed with COVID - Holding hypoglycemics at this time CBG (last 3)  Recent Labs    12/28/22 0326 12/28/22 0746 12/28/22 1213  GLUCAP 167* 155* 196*     Mixed hyperlipidemia - Continue pravastatin 20 mg daily   DVT prophylaxis: IV heparin -->apixaban  Code Status: Full  Family Communication: wife at bedside  2/18, 2/19    Consultants:  cardiology Procedures:  TTE  Antimicrobials:    Subjective: Pt SOB with ambulation short distances; no cough, less chest pain; no bleeding or bruising seen;  palpitations with ambulating but not at rest;   Objective: Vitals:   12/28/22 0813 12/28/22 0846 12/28/22 1010 12/28/22 1249  BP:  116/73 116/73 125/66  Pulse: 69 65 65 65  Resp: 16 14  18  $ Temp:  98.3 F (36.8 C)  97.7 F (36.5 C)  TempSrc:  Oral  Oral  SpO2: 97% 99%  95%  Weight:      Height:        Intake/Output Summary (Last 24 hours) at 12/28/2022 1622 Last data filed at 12/28/2022 1200 Gross per 24 hour  Intake 960 ml  Output --  Net 960 ml   Filed Weights   12/26/22 2113 12/27/22 0011  Weight: 97.1 kg 95 kg   Examination:  General exam: frail male, appears pale and weak; Appears calm NAD  Respiratory system: Clear to auscultation. Respiratory effort normal. Cardiovascular system: normal S1 & S2 heard. No JVD, murmurs, rubs, gallops or clicks. trace pedal edema. Gastrointestinal system: Abdomen is nondistended, soft and nontender. No organomegaly or masses felt. Normal bowel sounds heard. Central nervous system: Alert and oriented. No focal neurological deficits. Extremities: mild edema LLE; Symmetric 5 x 5 power. Skin: No rashes, lesions or ulcers. Psychiatry: Judgement and insight appear normal. Mood & affect appropriate.   Data Reviewed: I have personally reviewed following labs and imaging studies  CBC: Recent Labs  Lab 12/22/22 2002 12/26/22 2128 12/27/22 0649 12/28/22 0432  WBC 2.4* 4.5 3.7* 2.7*  NEUTROABS  --  2.9 2.0  --   HGB 14.7 15.1 14.2 14.0  HCT 41.3 43.1 40.1 39.5  MCV 89.0 89.8 88.9 88.0  PLT 68* 77* 77* 76*    Basic Metabolic Panel: Recent Labs  Lab 12/22/22 2002 12/26/22 2128 12/27/22 0649 12/28/22 0748  NA 137 135 135 136  K 3.6 3.4* 3.2* 3.6  CL 105 107 108 106  CO2 21* 22 20* 20*  GLUCOSE 122* 129* 110* 145*  BUN 17 15 16 15   $ CREATININE 1.21 1.02 0.91 0.96  CALCIUM 8.6* 9.0 8.8* 8.6*  MG  --   --  1.8  --     CBG: Recent Labs  Lab 12/27/22 2048 12/28/22 0326 12/28/22 0746 12/28/22 1213 12/28/22 1613  GLUCAP 222* 167* 155* 196* 170*    Recent Results (from the past 240 hour(s))  Resp panel by RT-PCR (RSV, Flu A&B, Covid) Anterior Nasal Swab     Status: None   Collection Time: 12/22/22  9:44 PM   Specimen: Anterior Nasal Swab  Result Value Ref Range Status   SARS Coronavirus 2 by RT PCR NEGATIVE NEGATIVE Final    Comment: (NOTE)  SARS-CoV-2 target nucleic acids are NOT DETECTED.  The SARS-CoV-2 RNA is generally detectable in upper respiratory specimens during the acute phase of infection. The lowest concentration of SARS-CoV-2 viral copies this assay can detect is 138 copies/mL. A negative result does not preclude SARS-Cov-2 infection and should not be used as the sole basis for treatment or other patient management decisions. A negative result may occur with  improper specimen collection/handling, submission of specimen other than nasopharyngeal swab, presence of viral mutation(s) within the areas targeted by this assay, and inadequate number of viral copies(<138 copies/mL). A negative result must be combined with clinical observations, patient history, and epidemiological information. The expected result is Negative.  Fact Sheet for Patients:  EntrepreneurPulse.com.au  Fact Sheet for Healthcare Providers:  IncredibleEmployment.be  This test is no t yet approved or cleared by the [place] FDA and  has been authorized for detection and/or diagnosis of SARS-CoV-2 by FDA under an Emergency Use Authorization (EUA). This EUA will remain  in effect (meaning this test can be used) for the duration of the COVID-19 declaration under Section 564(b)(1) of the Act, 21 U.S.C.section 360bbb-3(b)(1), unless the authorization is terminated  or revoked sooner.        Influenza A by PCR NEGATIVE NEGATIVE Final   Influenza B by PCR NEGATIVE NEGATIVE Final    Comment: (NOTE) The Xpert Xpress SARS-CoV-2/FLU/RSV plus assay is intended as an aid in the diagnosis of influenza from Nasopharyngeal swab specimens and should not be used as a sole basis for treatment. Nasal washings and aspirates are unacceptable for Xpert Xpress SARS-CoV-2/FLU/RSV testing.  Fact Sheet for Patients: EntrepreneurPulse.com.au  Fact Sheet for Healthcare Providers: IncredibleEmployment.be  This test is not yet approved or cleared by the [place] FDA and has been authorized for detection and/or diagnosis of SARS-CoV-2 by FDA under an Emergency Use Authorization (EUA). This EUA will remain in effect (meaning this test can be used) for the duration of the COVID-19 declaration under Section 564(b)(1) of the Act, 21 U.S.C. section 360bbb-3(b)(1), unless the authorization is terminated or revoked.     Resp Syncytial Virus by PCR NEGATIVE NEGATIVE Final    Comment: (NOTE) Fact Sheet for Patients: EntrepreneurPulse.com.au  Fact Sheet for Healthcare Providers: IncredibleEmployment.be  This test is not yet approved or cleared by the [place] FDA and has been authorized for detection and/or diagnosis of SARS-CoV-2 by FDA under an Emergency Use Authorization (EUA). This EUA will remain in effect (meaning this test can be used) for the duration of the COVID-19 declaration under Section 564(b)(1) of the Act, 21 U.S.C. section 360bbb-3(b)(1), unless the authorization is terminated or revoked.  Performed at [facility], [address] [place], [place] [address]      Radiology Studies: ECHOCARDIOGRAM COMPLETE  Result Date: 12/28/2022    ECHOCARDIOGRAM REPORT   Patient Name:   [name] [name] Date of Exam: 12/27/2022 Medical Rec #:  [id number]       Height:       69.0 in Accession #:     [id number]      Weight:       209.4 lb Date of Birth:  Oct 26, 1956       BSA:          2.107 m Patient Age:    52 years        BP:           140/78 mmHg Patient Gender: M  HR:           70 bpm. Exam Location:  [facility] Procedure: 2D Echo, Cardiac Doppler and Color Doppler Indications:    Pulmonary Embolus I26.09  History:        Patient has prior history of Echocardiogram examinations, most                 recent 07/28/2021. CHF, CAD, Prior CABG, TIA, Arrythmias:PVC;                 Risk Factors:Hypertension, Diabetes and Dyslipidemia.  Sonographer:    Alvino Chapel RCS Referring Phys: [id number] ASIA B Darwin  Sonographer Comments: Technically Difficult Study esp. the apical windows. IMPRESSIONS  1. Left ventricular ejection fraction, by estimation, is 60 to 65%. The left ventricle has normal function. The left ventricle has no regional wall motion abnormalities. There is moderate left ventricular hypertrophy. Left ventricular diastolic parameters are consistent with Grade I diastolic dysfunction (impaired relaxation).  2. Right ventricular systolic function is low normal. The right ventricular size is not well visualized. Tricuspid regurgitation signal is inadequate for assessing PA pressure.  3. The mitral valve is abnormal. Mild mitral valve regurgitation. No evidence of mitral stenosis. Moderate mitral annular calcification.  4. The aortic valve is tricuspid. There is mild calcification of the aortic valve. Aortic valve regurgitation is moderate. No aortic stenosis is present.  5. The inferior vena cava is normal in size with greater than 50% respiratory variability, suggesting right atrial pressure of 3 mmHg. Comparison(s): No significant change from prior study. FINDINGS  Left Ventricle: Left ventricular ejection fraction, by estimation, is 60 to 65%. The left ventricle has normal function. The left ventricle has no regional wall motion abnormalities. Definity contrast agent was given IV to  delineate the left ventricular  endocardial borders. The left ventricular internal cavity size was normal in size. There is moderate left ventricular hypertrophy. Left ventricular diastolic parameters are consistent with Grade I diastolic dysfunction (impaired relaxation). Right Ventricle: The right ventricular size is not well visualized. No increase in right ventricular wall thickness. Right ventricular systolic function is low normal. Tricuspid regurgitation signal is inadequate for assessing PA pressure. Left Atrium: Left atrial size was normal in size. Right Atrium: Right atrial size was normal in size. Pericardium: There is no evidence of pericardial effusion. Mitral Valve: The mitral valve is abnormal. Moderate mitral annular calcification. Mild mitral valve regurgitation. No evidence of mitral valve stenosis. Tricuspid Valve: The tricuspid valve is not well visualized. Tricuspid valve regurgitation is not demonstrated. No evidence of tricuspid stenosis. Aortic Valve: The aortic valve is tricuspid. There is mild calcification of the aortic valve. Aortic valve regurgitation is moderate. Aortic regurgitation PHT measures 478 msec. No aortic stenosis is present. Pulmonic Valve: The pulmonic valve was not well visualized. Pulmonic valve regurgitation is not visualized. No evidence of pulmonic stenosis. Aorta: The aortic root is normal in size and structure. Venous: The inferior vena cava is normal in size with greater than 50% respiratory variability, suggesting right atrial pressure of 3 mmHg. IAS/Shunts: No atrial level shunt detected by color flow Doppler.  LEFT VENTRICLE PLAX 2D LVIDd:         4.40 cm   Diastology LVIDs:         3.00 cm   LV e' medial:    7.51 cm/s LV PW:         1.00 cm   LV E/e' medial:  10.8 LV IVS:  1.10 cm   LV e' lateral:   11.00 cm/s LVOT diam:     2.00 cm   LV E/e' lateral: 7.4 LV SV:         73 LV SV Index:   35 LVOT Area:     3.14 cm  RIGHT VENTRICLE RV S prime:     6.74 cm/s  TAPSE (M-mode): 0.9 cm LEFT ATRIUM             Index        RIGHT ATRIUM           Index LA diam:        3.10 cm 1.47 cm/m   RA Area:     15.00 cm LA Vol (A2C):   48.2 ml 22.88 ml/m  RA Volume:   32.60 ml  15.47 ml/m LA Vol (A4C):   32.9 ml 15.62 ml/m LA Biplane Vol: 41.1 ml 19.51 ml/m  AORTIC VALVE LVOT Vmax:   102.00 cm/s LVOT Vmean:  72.700 cm/s LVOT VTI:    0.232 m AI PHT:      478 msec  AORTA Ao Root diam: 3.30 cm MITRAL VALVE MV Area (PHT): 2.56 cm     SHUNTS MV Decel Time: 296 msec     Systemic VTI:  0.23 m MV E velocity: 81.40 cm/s   Systemic Diam: 2.00 cm MV A velocity: 103.00 cm/s MV E/A ratio:  0.79 Vishnu Priya Mallipeddi Electronically signed by Lorelee Cover Mallipeddi Signature Date/Time: 12/28/2022/11:46:06 AM    Final    US Venous Img Lower Bilateral (DVT)  Result Date: 12/27/2022 CLINICAL DATA:  [id number] Acute pulmonary embolism (HCC) [id number] EXAM: BILATERAL LOWER EXTREMITY VENOUS DOPPLER ULTRASOUND TECHNIQUE: Gray-scale sonography with graded compression, as well as color Doppler and duplex ultrasound were performed to evaluate the lower extremity deep venous systems from the level of the common femoral vein and including the common femoral, femoral, profunda femoral, popliteal and calf veins including the posterior tibial, peroneal and gastrocnemius veins when visible. The superficial great saphenous vein was also interrogated. Spectral Doppler was utilized to evaluate flow at rest and with distal augmentation maneuvers in the common femoral, femoral and popliteal veins. COMPARISON:  CTA PE, 12/26/2022 FINDINGS: RIGHT LOWER EXTREMITY VENOUS Normal compressibility of the RIGHT common femoral, superficial femoral, and popliteal veins, as well as the visualized calf veins. Visualized portions of profunda femoral vein and great saphenous vein unremarkable. No filling defects to suggest DVT on grayscale or color Doppler imaging. Doppler waveforms show normal direction of venous flow, normal  respiratory plasticity and response to augmentation. OTHER No evidence of superficial thrombophlebitis or abnormal fluid collection. Limitations: none LEFT LOWER EXTREMITY VENOUS Heterogeneously hypoechoic partially-occlusive filling defect within the imaged portions of the LEFT profunda femoral vein. Normal compressibility of the LEFT common femoral, superficial femoral, and popliteal veins, as well as the visualized calf veins. Visualized portions of the great saphenous vein unremarkable. OTHER No evidence of superficial thrombophlebitis or abnormal fluid collection. Limitations: none IMPRESSION: 1. Examination is POSITIVE for LEFT lower extremity DVT within the profunda femoral vein. 2. No evidence of femoropopliteal DVT within the RIGHT lower extremity. Michaelle Birks, MD [facility] Electronically Signed   By: Michaelle Birks M.D.   On: 12/27/2022 13:25   CT HEAD WO CONTRAST (5MM)  Result Date: 12/27/2022 CLINICAL DATA:  Neuro deficit with acute stroke suspected left-sided weakness and numbness for a few hours. EXAM: CT HEAD WITHOUT CONTRAST TECHNIQUE: Contiguous axial images were  obtained from the base of the skull through the vertex without intravenous contrast. RADIATION DOSE REDUCTION: This exam was performed according to the departmental dose-optimization program which includes automated exposure control, adjustment of the mA and/or kV according to patient size and/or use of iterative reconstruction technique. COMPARISON:  07/30/2022 brain MRI FINDINGS: Brain: No evidence of acute infarction, hemorrhage, hydrocephalus, extra-axial collection or mass lesion/mass effect. Tiny remote right cerebellar infarct Vascular: No hyperdense vessel or unexpected calcification. Skull: Normal. Negative for fracture or focal lesion. Sinuses/Orbits: No acute finding. IMPRESSION: No acute finding. Electronically Signed   By: Jorje Guild M.D.   On: 12/27/2022 09:28    CT Angio Chest PE W and/or Wo Contrast  Result Date: 12/26/2022 CLINICAL DATA:  Pulmonary embolism suspected, high probability. Midsternal chest pain. EXAM: CT ANGIOGRAPHY CHEST WITH CONTRAST TECHNIQUE: Multidetector CT imaging of the chest was performed using the standard protocol during bolus administration of intravenous contrast. Multiplanar CT image reconstructions and MIPs were obtained to evaluate the vascular anatomy. RADIATION DOSE REDUCTION: This exam was performed according to the departmental dose-optimization program which includes automated exposure control, adjustment of the mA and/or kV according to patient size and/or use of iterative reconstruction technique. CONTRAST:  59m OMNIPAQUE IOHEXOL 350 MG/ML SOLN COMPARISON:  12/12/2021. FINDINGS: Cardiovascular: The heart is normal in size and there is no pericardial effusion. Multi-vessel coronary artery calcifications are noted. There is atherosclerotic calcification of the aorta without evidence of aneurysm. The pulmonary trunk is normal in caliber. Small pulmonary artery filling defects are noted in the segmental and subsegmental pulmonary arteries in the right middle lobe. Examination is limited due to respiratory motion artifact. No evidence of right heart strain. Mediastinum/Nodes: No mediastinal, hilar, or axillary lymphadenopathy by size criteria. Thyroid gland, trachea, and esophagus are within normal limits. Lungs/Pleura: Multifocal scattered ground-glass opacities are noted in the right lung and at the left lung base, greater on the right than on the left. No effusion or pneumothorax. Upper Abdomen: There is fatty infiltration of the liver. The liver has a slightly lobular contour which may be associated with underlying cirrhosis. Gallbladder is surgically absent. The spleen is enlarged, unchanged from prior exams Musculoskeletal: Sternotomy wires are noted. Degenerative changes in the thoracic spine. No acute osseous abnormality.  Review of the MIP images confirms the above findings. IMPRESSION: 1. Small segmental and subsegmental pulmonary embolus in the right middle lobe. No evidence of right heart strain. 2. Scattered ground-glass opacities in the right lung and at the left lung base, likely infectious or inflammatory. 3. Hepatic steatosis. The liver has a slightly lobular contour, raising the possibility of underlying cirrhosis. 4. Splenomegaly. 5. Aortic atherosclerosis and coronary artery calcifications. Critical Value/emergent results were called by telephone at the time of interpretation on 12/26/2022 at 10:53 pm to provider JOSEPH ZAMMIT , who verbally acknowledged these results. Electronically Signed   By: LBrett FairyM.D.   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 Cardiac silhouette is unremarkable. No airspace disease, effusion, or pneumothorax. No acute bony abnormality. IMPRESSION: 1. Stable chest, no acute process. Electronically Signed   By: MRanda NgoM.D.   On: 12/26/2022 22:01    Scheduled Meds:  apixaban  10 mg Oral BID   Followed by   [Derrill MemoON 01/04/2023] apixaban  5 mg Oral BID  aspirin EC  81 mg Oral Daily   carvedilol  3.125 mg Oral BID   citalopram  20 mg Oral QHS   ezetimibe  10 mg Oral Daily   fluticasone  2 spray Each Nare BID   gabapentin  300 mg Oral TID   insulin aspart  0-15 Units Subcutaneous TID WC   insulin aspart  0-5 Units Subcutaneous QHS   insulin aspart  3 Units Subcutaneous TID WC   isosorbide mononitrate  30 mg Oral QHS   isosorbide mononitrate  60 mg Oral Daily   mometasone-formoterol  2 puff Inhalation BID   pantoprazole  40 mg Oral Daily   pravastatin  20 mg Oral Daily   topiramate  50 mg Oral BID   Continuous Infusions:   LOS: 0 days   Time spent: 36  mins  Itati Brocksmith Wynetta Emery, MD How to contact the [facility] Attending or Consulting provider 7A - 7P or covering provider during after hours [place], for this patient?  Check the care team in [facility] and look for a) attending/consulting TR[facility] provider listed and b) the [facility] team listed Log into [email or web address] and use [place]'s universal password to access. If you do not have the password, please contact the hospital operator. Locate the [facility] provider you are looking for under [facility] and page to a number that you can be directly reached. If you still have difficulty reaching the provider, please page the [facility] (Director on Call) for the Hospitalists listed on amion for assistance.  12/28/2022, 4:22 PM

## 2022-12-28 NOTE — Progress Notes (Signed)
Mobility Specialist Progress Note:    12/28/22 1100  Mobility  Activity Ambulated independently in room  Level of Assistance Standby assist, set-up cues, supervision of patient - no hands on  Assistive Device None  Distance Ambulated (ft) 45 ft  Activity Response Tolerated well  Mobility Referral Yes  $Mobility charge 1 Mobility   Pt was agreeable to mobility session. Tolerated well, asx throughout. No AD required, SV. Sat pt in chair with all needs met.   Royetta Crochet Mobility Specialist Please contact via Solicitor or  Rehab office at [phone]

## 2022-12-28 NOTE — Evaluation (Signed)
Physical Therapy Evaluation Patient Details Name: [name] [name] MRN: [id number] DOB: 13-Mar-1956 Today's Date: 12/28/2022  History of Present Illness  [name] [name] is a 67 y.o. male with medical history significant of anxiety, asthma, cirrhosis of liver, coronary artery disease, hypertension, GERD, hyperlipidemia, history of stroke, history of TIA, diabetes mellitus type 2, and more presents the ED with a chief complaint of chest pain.  Patient reports it has been going on for 2 weeks.  It has been constant.  This is 1 of several visits to the ER.  He describes the pain as substernal.  It waxes and wanes.  It feels sharp when it is at its worst.  There is no worsening of the pain with deep breath or cough.  Patient reports he has not had a cough.  He has had a fever at home with a max of 103.7 on Wednesday.  He reports he took his first COVID test at 2 AM on Wednesday morning.  He was started on Paxlovid on Thursday.  He reports nausea but no vomiting.  He had a decreased appetite.  He reports that he has been hypoglycemic since this is started, and that his PCP has taken him off of his insulin.  They are working on cutting out metformin.  Patient has no personal or family history of blood clot.  Review of systems patient does report that he had some asymmetrical numbness on the left side.  He is not sure if it was weak or not because he did not try to get out of bed when he had it.  He reports it lasted a couple of hours and then resolved.  He has a history of TIAs.  Patient has no other complaints at this time.   Clinical Impression  Patient demonstrates good return for bed mobility, transfers and ambulating in room without loss of balance, only c/o mild pain left side of chest.  Patient encouraged to ambulate ad lib in room for length of stay.  Plan:  Patient discharged from physical therapy to care of nursing for ambulation daily as tolerated for length of stay.        Recommendations for follow  up therapy are one component of a multi-disciplinary discharge planning process, led by the attending physician.  Recommendations may be updated based on patient status, additional functional criteria and insurance authorization.  Follow Up Recommendations No PT follow up      Assistance Recommended at Discharge PRN  Patient can return home with the following  Help with stairs or ramp for entrance    Equipment Recommendations None recommended by PT  Recommendations for Other Services       Functional Status Assessment Patient has had a recent decline in their functional status and/or demonstrates limited ability to make significant improvements in function in a reasonable and predictable amount of time     Precautions / Restrictions Precautions Precautions: None Restrictions Weight Bearing Restrictions: No      Mobility  Bed Mobility Overal bed mobility: Modified Independent                  Transfers Overall transfer level: Modified independent                      Ambulation/Gait Ambulation/Gait assistance: Modified independent (Device/Increase time) Gait Distance (Feet): 80 Feet Assistive device: None Gait Pattern/deviations: Decreased step length - right, Decreased step length - left, Decreased stride length Gait velocity: decreased  General Gait Details: slightly labored cadence with good return for ambulating in room without loss of balance and without need for an AD  Stairs            Wheelchair Mobility    Modified Rankin (Stroke Patients Only)       Balance Overall balance assessment: No apparent balance deficits (not formally assessed)                                           Pertinent Vitals/Pain Pain Assessment Pain Assessment: Faces Faces Pain Scale: Hurts a little bit Pain Location: mild pressure left side of chest, discomfort left hip Pain Descriptors / Indicators: Discomfort Pain  Intervention(s): Limited activity within patient's tolerance, Monitored during session    Home Living Family/patient expects to be discharged to:: Private residence Living Arrangements: Spouse/significant other Available Help at Discharge: Family;Available 24 hours/day Type of Home: House Home Access: Level entry       Home Layout: One level Home Equipment: Conservation officer, nature (2 wheels);Cane - single point;Grab bars - tub/shower      Prior Function Prior Level of Function : Independent/Modified Independent;Driving             Mobility Comments: Community ambulator without AD, drives ADLs Comments: Independent     Hand Dominance   Dominant Hand: Right    Extremity/Trunk Assessment   Upper Extremity Assessment Upper Extremity Assessment: Overall WFL for tasks assessed    Lower Extremity Assessment Lower Extremity Assessment: Overall WFL for tasks assessed    Cervical / Trunk Assessment Cervical / Trunk Assessment: Normal  Communication   Communication: No difficulties  Cognition Arousal/Alertness: Awake/alert Behavior During Therapy: WFL for tasks assessed/performed Overall Cognitive Status: Within Functional Limits for tasks assessed                                          General Comments      Exercises     Assessment/Plan    PT Assessment Patient does not need any further PT services  PT Problem List         PT Treatment Interventions      PT Goals (Current goals can be found in the Care Plan section)  Acute Rehab PT Goals Patient Stated Goal: return home with family to assist PT Goal Formulation: With patient Time For Goal Achievement: 12/28/22 Potential to Achieve Goals: Good    Frequency       Co-evaluation               AM-PAC PT "6 Clicks" Mobility  Outcome Measure Help needed turning from your back to your side while in a flat bed without using bedrails?: None Help needed moving from lying on your back to  sitting on the side of a flat bed without using bedrails?: None Help needed moving to and from a bed to a chair (including a wheelchair)?: None Help needed standing up from a chair using your arms (e.g., wheelchair or bedside chair)?: None Help needed to walk in hospital room?: None Help needed climbing 3-5 steps with a railing? : A Little 6 Click Score: 23    End of Session   Activity Tolerance: Patient tolerated treatment well;Patient limited by fatigue Patient left: in bed;with call bell/phone within reach Nurse Communication:  Mobility status PT Visit Diagnosis: Unsteadiness on feet (R26.81);Other abnormalities of gait and mobility (R26.89);Muscle weakness (generalized) (M62.81)    Time: 0912-0929 PT Time Calculation (min) (ACUTE ONLY): 17 min   Charges:   PT Evaluation $PT Eval Low Complexity: 1 Low PT Treatments $Therapeutic Activity: 8-22 mins        11:21 AM, 12/28/22 Lonell Grandchild, MPT Physical Therapist with [facility] 336 [phone] office [phone] mobile phone

## 2022-12-28 NOTE — Assessment & Plan Note (Signed)
-   no bleeding events so far, monitor for bleeding complications - check with cardiology team regarding continuation of aspirin with apixaban

## 2022-12-29 ENCOUNTER — Encounter (HOSPITAL_COMMUNITY): Payer: Self-pay | Admitting: Family Medicine

## 2022-12-29 DIAGNOSIS — I25810 Atherosclerosis of coronary artery bypass graft(s) without angina pectoris: Secondary | ICD-10-CM

## 2022-12-29 DIAGNOSIS — D696 Thrombocytopenia, unspecified: Secondary | ICD-10-CM

## 2022-12-29 DIAGNOSIS — I824Z2 Acute embolism and thrombosis of unspecified deep veins of left distal lower extremity: Secondary | ICD-10-CM | POA: Diagnosis not present

## 2022-12-29 DIAGNOSIS — I351 Nonrheumatic aortic (valve) insufficiency: Secondary | ICD-10-CM

## 2022-12-29 DIAGNOSIS — I2699 Other pulmonary embolism without acute cor pulmonale: Secondary | ICD-10-CM | POA: Diagnosis not present

## 2022-12-29 DIAGNOSIS — R0789 Other chest pain: Secondary | ICD-10-CM

## 2022-12-29 DIAGNOSIS — K219 Gastro-esophageal reflux disease without esophagitis: Secondary | ICD-10-CM | POA: Diagnosis not present

## 2022-12-29 DIAGNOSIS — E782 Mixed hyperlipidemia: Secondary | ICD-10-CM

## 2022-12-29 DIAGNOSIS — Z794 Long term (current) use of insulin: Secondary | ICD-10-CM | POA: Diagnosis not present

## 2022-12-29 DIAGNOSIS — I25118 Atherosclerotic heart disease of native coronary artery with other forms of angina pectoris: Secondary | ICD-10-CM | POA: Diagnosis not present

## 2022-12-29 DIAGNOSIS — I82402 Acute embolism and thrombosis of unspecified deep veins of left lower extremity: Secondary | ICD-10-CM | POA: Diagnosis not present

## 2022-12-29 LAB — GLUCOSE, CAPILLARY
Glucose-Capillary: 140 mg/dL — ABNORMAL HIGH (ref 70–99)
Glucose-Capillary: 172 mg/dL — ABNORMAL HIGH (ref 70–99)

## 2022-12-29 LAB — CBC
HCT: 42.2 % (ref 39.0–52.0)
Hemoglobin: 14.7 g/dL (ref 13.0–17.0)
MCH: 31.1 pg (ref 26.0–34.0)
MCHC: 34.8 g/dL (ref 30.0–36.0)
MCV: 89.2 fL (ref 80.0–100.0)
Platelets: 91 10*3/uL — ABNORMAL LOW (ref 150–400)
RBC: 4.73 MIL/uL (ref 4.22–5.81)
RDW: 13.9 % (ref 11.5–15.5)
WBC: 3.4 10*3/uL — ABNORMAL LOW (ref 4.0–10.5)
nRBC: 0 % (ref 0.0–0.2)

## 2022-12-29 MED ORDER — ROSUVASTATIN CALCIUM 20 MG PO TABS: 20.0000 mg | ORAL_TABLET | Freq: Every day | ORAL | 2 refills | Status: DC

## 2022-12-29 MED ORDER — APIXABAN 5 MG PO TABS: ORAL_TABLET | ORAL | 2 refills | Status: DC

## 2022-12-29 NOTE — Discharge Summary (Signed)
Physician Discharge Summary  [name] [name] [id number] DOB: 09-Jun-1956 DOA: 12/26/2022  PCP: [name], MD  Admit date: 12/26/2022 Discharge date: 12/29/2022  Admitted From:  HOME  Disposition: HOME   Recommendations for Outpatient Follow-up:  Follow up with PCP in 1 weeks Follow up with cardiology as scheduled  Follow up with hematology as soon as able in next 1-2 weeks Please obtain CBC in 1-2 weeks  Discharge Condition: STABLE   CODE STATUS: FULL DIET:  resume prior home diet   Brief Hospitalization Summary: Please see all hospital notes, images, labs for full details of the hospitalization. ADMISSION HPI:  67 y.o. male with medical history significant of anxiety, asthma, cirrhosis of liver, coronary artery disease, hypertension, GERD, hyperlipidemia, history of stroke, history of TIA, diabetes mellitus type 2, and more presents the ED with a chief complaint of chest pain.  Patient reports it has been going on for 2 weeks.  It has been constant.  This is 1 of several visits to the ER.  He describes the pain as substernal.  It waxes and wanes.  It feels sharp when it is at its worst.  There is no worsening of the pain with deep breath or cough.  Patient reports he has not had a cough.  He has had a fever at home with a max of 103.7 on Wednesday.  He reports he took his first COVID test at 2 AM on Wednesday morning.  He was started on Paxlovid on Thursday.  He reports nausea but no vomiting.  He had a decreased appetite.  He reports that he has been hypoglycemic since this is started, and that his PCP has taken him off of his insulin.  They are working on cutting out metformin.  Patient has no personal or family history of blood clot.  Review of systems patient does report that he had some asymmetrical numbness on the left side.  He is not sure if it was weak or not because he did not try to get out of bed when he had it.  He reports it lasted a couple of hours and then resolved.  He  has a history of TIAs.  Patient has no other complaints at this time.  Patient is vaccinated for COVID and he is full code.    HOSPITAL COURSE BY PROBLEM    Assessment and Plan:  * Pulmonary embolism ([place]) - CTA shows acute pulmonary embolism without heart strain - Heparin IV for 24 hours given  - DC heparin and started oral apixaban per pharm D dosing - No history of blood clot, no family history of blood clot - Ultrasound DVT bilateral lower extremities with DVT left leg found  - Echo with no right heart strain - Monitored on telemetry and remained stable   Left leg DVT ([place]) - Added TED hose to bilateral lower extremities to try to prevent post thrombotic phlebitis  COVID-19 virus infection - Started Paxlovid on Thursday - completed Paxlovid 2/19 - he cannot take this with apixaban  - No oxygen requirement at this time - Albuterol as needed - Continue to monitor  Coronary artery disease involving coronary bypass graft of native heart without angina pectoris - Continue aspirin, beta-blocker, Imdur, statin, Ranexa - Monitor on telemetry - Troponin flat and normal - EKG is without acute changes - checked with cardiology service to see if it will be recommended for him to continue aspirin while on apixaban in setting of chronic thrombocytopenia --the recommendation was to  stop aspirin   Stroke-like symptoms - Left-sided numbness that lasted for couple of hours today - CT head no acute findings - Symptoms are completely resolved at this time  GERD (gastroesophageal reflux disease) - Continue PPI  Non-cardiac chest pain - Likely secondary to PE - Troponin 3, 3 which is the same as it was a week ago when he was in the ER - Chest x-ray shows stable exam - CTA shows PE; chest pain symptoms resolved now  Hyperlipidemia - Continue Zetia and statin  Thrombocytopenia ([place]) - no bleeding events so far, monitor for bleeding complications - cardiology team recommended stopping  aspirin now on apixaban -- referral made to hematology for further follow up and evaluation   Anxiety Continue Celexa  Type 2 diabetes mellitus ([place]) - Patient is on [name] and metformin at home - They report that he has been hypoglycemic since being diagnosed with COVID - resume home treatment regimen at discharge; hypoglycemia precautions ordered CBG (last 3)  Recent Labs    12/28/22 2039 12/29/22 0314 12/29/22 0731  GLUCAP 167* 140* 172*     Mixed hyperlipidemia - CARDIOLOGY recommended switching to rosuvastatin 20 mg daily  Discharge Diagnoses:  Principal Problem:   Pulmonary embolism ([place]) Active Problems:   Mixed hyperlipidemia   Type 2 diabetes mellitus ([place])   Anxiety   Thrombocytopenia ([facility])   Hyperlipidemia   Non-cardiac chest pain   GERD (gastroesophageal reflux disease)   Stroke-like symptoms   Coronary artery disease involving coronary bypass graft of native heart without angina pectoris   COVID-19 virus infection   Left leg DVT ([place])   Nonrheumatic aortic valve insufficiency   Discharge Instructions: Discharge Instructions     Ambulatory referral to Hematology / Oncology   Complete by: As directed       Allergies as of 12/29/2022       Reactions   Divalproex Sodium Other (See Comments)   Causes anger   Statins Other (See Comments)   Muscle aches and cramps   Tramadol Other (See Comments)   Chest pain    Valproic Acid Other (See Comments)   Causes anger   Gadolinium Derivatives Nausea And Vomiting   07/25/19 Pt vomited immediately after IV gad. Denies itching, dyspnea.  (Adverse, not allergic, reaction   Tricor [fenofibrate] Other (See Comments)   Leg cramps        Medication List     STOP taking these medications    aspirin EC 81 MG tablet   HumaLOG KwikPen 100 UNIT/ML KwikPen Generic drug: insulin lispro   nirmatrelvir/ritonavir 20 x 150 MG & 10 x 100MG Tabs Commonly known as: PAXLOVID   pravastatin 20 MG tablet Commonly  known as: PRAVACHOL   ranolazine 500 MG 12 hr tablet Commonly known as: Ranexa   Tresiba FlexTouch 100 UNIT/ML FlexTouch Pen Generic drug: insulin degludec       TAKE these medications    albuterol 108 (90 Base) MCG/ACT inhaler Commonly known as: VENTOLIN HFA Inhale 2 puffs into the lungs every 6 (six) hours as needed for wheezing or shortness of breath.   apixaban 5 MG Tabs tablet Commonly known as: ELIQUIS 2 po bid thru 2/25, then 1 po BID afterwards   carvedilol 3.125 MG tablet Commonly known as: COREG Take 1 tablet (3.125 mg total) by mouth 2 (two) times daily.   citalopram 20 MG tablet Commonly known as: CELEXA Take 1 tablet (20 mg total) by mouth at bedtime.   cyclobenzaprine 10 MG tablet Commonly known  as: FLEXERIL Take 1 tablet (10 mg total) by mouth 3 (three) times daily as needed for muscle spasms.   dapagliflozin propanediol 5 MG Tabs tablet Commonly known as: Farxiga Take 1 tablet (5 mg total) by mouth daily before breakfast.   ezetimibe 10 MG tablet Commonly known as: ZETIA Take 1 tablet (10 mg total) by mouth daily.   fluticasone 50 MCG/ACT nasal spray Commonly known as: FLONASE Place 2 sprays into both nostrils 2 (two) times daily.   Fluticasone-Salmeterol 250-50 MCG/DOSE Aepb Commonly known as: ADVAIR Inhale 1 puff into the lungs every 12 (twelve) hours.   FreeStyle Libre 2 Reader Devi 1 Device by Does not apply route continuous.   FreeStyle Libre 2 Sensor Misc Apply one sensor every 14 days   gabapentin 300 MG capsule Commonly known as: NEURONTIN Take 1 capsule (300 mg total) by mouth 3 (three) times daily. Take 300 mg by mouth 3 (three) times daily.   isosorbide mononitrate 60 MG 24 hr tablet Commonly known as: IMDUR Take one (1) tablet by mouth ( 60 mg ) in the am and one half tablet ( 30 mg) in the pm.   Melatonin 10 MG Tabs Take 10 mg by mouth at bedtime.   metFORMIN 1000 MG tablet Commonly known as: GLUCOPHAGE Take 1 tablet  (1,000 mg total) by mouth 2 (two) times daily.   nitroGLYCERIN 0.4 MG SL tablet Commonly known as: NITROSTAT Place 1 tablet (0.4 mg total) under the tongue every 5 (five) minutes x 3 doses as needed for chest pain (if no relief after 2nd dose, proceed to the ED for an evaluation or call 911). Place 0.4 mg under the tongue every 5 (five) minutes x 3 doses as needed for chest pain (if no relief after 2nd dose, proceed to the ED for an evaluation or call 911).   omeprazole 40 MG capsule Commonly known as: PRILOSEC Take 1 capsule (40 mg total) by mouth daily.   polycarbophil 625 MG tablet Commonly known as: FIBERCON Take 625 mg by mouth daily.   rosuvastatin 20 MG tablet Commonly known as: Crestor Take 1 tablet (20 mg total) by mouth daily.   tirzepatide 5 MG/0.5ML Pen Commonly known as: [name] Inject 5 mg into the skin once a week.   topiramate 50 MG tablet Commonly known as: TOPAMAX Take 50 mg by mouth 2 (two) times daily.        Follow-up Information     Erma Heritage, PA-C Follow up.   Specialties: Cardiology, Cardiology Why: [facility] location - keep follow-up scheduled Thursday Feb 18, 2023 1:00 PM (Arrive by 12:45 PM). We have cancelled the outpatient echo that was coming up since you had this done while in the hospital. Contact information: [place] [address] [phone]         [facility]. Schedule an appointment as soon as possible for a visit in 2 week(s).   Specialty: Oncology Why: Hospital Follow Up Contact information: [address] [id number] [place] [address] [phone]               Allergies  Allergen Reactions   Divalproex Sodium Other (See Comments)    Causes anger   Statins Other (See Comments)    Muscle aches and cramps   Tramadol Other (See Comments)    Chest pain    Valproic Acid Other (See Comments)    Causes anger    Gadolinium  Derivatives Nausea And Vomiting    07/25/19 Pt vomited immediately after IV gad. Denies itching, dyspnea.  (Adverse, not allergic, reaction   Tricor [Fenofibrate] Other (See Comments)    Leg cramps   Allergies as of 12/29/2022       Reactions   Divalproex Sodium Other (See Comments)   Causes anger   Statins Other (See Comments)   Muscle aches and cramps   Tramadol Other (See Comments)   Chest pain    Valproic Acid Other (See Comments)   Causes anger   Gadolinium Derivatives Nausea And Vomiting   07/25/19 Pt vomited immediately after IV gad. Denies itching, dyspnea.  (Adverse, not allergic, reaction   Tricor [fenofibrate] Other (See Comments)   Leg cramps        Medication List     STOP taking these medications    aspirin EC 81 MG tablet   HumaLOG KwikPen 100 UNIT/ML KwikPen Generic drug: insulin lispro   nirmatrelvir/ritonavir 20 x 150 MG & 10 x 100MG Tabs Commonly known as: PAXLOVID   pravastatin 20 MG tablet Commonly known as: PRAVACHOL   ranolazine 500 MG 12 hr tablet Commonly known as: Ranexa   Tresiba FlexTouch 100 UNIT/ML FlexTouch Pen Generic drug: insulin degludec       TAKE these medications    albuterol 108 (90 Base) MCG/ACT inhaler Commonly known as: VENTOLIN HFA Inhale 2 puffs into the lungs every 6 (six) hours as needed for wheezing or shortness of breath.   apixaban 5 MG Tabs tablet Commonly known as: ELIQUIS 2 po bid thru 2/25, then 1 po BID afterwards   carvedilol 3.125 MG tablet Commonly known as: COREG Take 1 tablet (3.125 mg total) by mouth 2 (two) times daily.   citalopram 20 MG tablet Commonly known as: CELEXA Take 1 tablet (20 mg total) by mouth at bedtime.   cyclobenzaprine 10 MG tablet Commonly known as: FLEXERIL Take 1 tablet (10 mg total) by mouth 3 (three) times daily as needed for muscle spasms.   dapagliflozin propanediol 5 MG Tabs tablet Commonly known as: Farxiga Take 1 tablet (5 mg total) by mouth daily  before breakfast.   ezetimibe 10 MG tablet Commonly known as: ZETIA Take 1 tablet (10 mg total) by mouth daily.   fluticasone 50 MCG/ACT nasal spray Commonly known as: FLONASE Place 2 sprays into both nostrils 2 (two) times daily.   Fluticasone-Salmeterol 250-50 MCG/DOSE Aepb Commonly known as: ADVAIR Inhale 1 puff into the lungs every 12 (twelve) hours.   FreeStyle Libre 2 Reader Devi 1 Device by Does not apply route continuous.   FreeStyle Libre 2 Sensor Misc Apply one sensor every 14 days   gabapentin 300 MG capsule Commonly known as: NEURONTIN Take 1 capsule (300 mg total) by mouth 3 (three) times daily. Take 300 mg by mouth 3 (three) times daily.   isosorbide mononitrate 60 MG 24 hr tablet Commonly known as: IMDUR Take one (1) tablet by mouth ( 60 mg ) in the am and one half tablet ( 30 mg) in the pm.   Melatonin 10 MG Tabs Take 10 mg by mouth at bedtime.   metFORMIN 1000 MG tablet Commonly known as: GLUCOPHAGE Take 1 tablet (1,000 mg total) by mouth 2 (two) times daily.   nitroGLYCERIN 0.4 MG SL tablet Commonly known as: NITROSTAT Place 1 tablet (0.4 mg total) under the tongue every 5 (five) minutes x 3 doses as needed for chest pain (if no relief after 2nd dose, proceed to the ED  for an evaluation or call 911). Place 0.4 mg under the tongue every 5 (five) minutes x 3 doses as needed for chest pain (if no relief after 2nd dose, proceed to the ED for an evaluation or call 911).   omeprazole 40 MG capsule Commonly known as: PRILOSEC Take 1 capsule (40 mg total) by mouth daily.   polycarbophil 625 MG tablet Commonly known as: FIBERCON Take 625 mg by mouth daily.   rosuvastatin 20 MG tablet Commonly known as: Crestor Take 1 tablet (20 mg total) by mouth daily.   tirzepatide 5 MG/0.5ML Pen Commonly known as: [name] Inject 5 mg into the skin once a week.   topiramate 50 MG tablet Commonly known as: TOPAMAX Take 50 mg by mouth 2 (two) times daily.         Procedures/Studies: ECHOCARDIOGRAM COMPLETE  Result Date: 12/28/2022    ECHOCARDIOGRAM REPORT   Patient Name:   [name] [name] Date of Exam: 12/27/2022 Medical Rec #:  [id number]       Height:       69.0 in Accession #:    [id number]      Weight:       209.4 lb Date of Birth:  1956-10-10       BSA:          2.107 m Patient Age:    82 years        BP:           140/78 mmHg Patient Gender: M               HR:           70 bpm. Exam Location:  [facility] Procedure: 2D Echo, Cardiac Doppler and Color Doppler Indications:    Pulmonary Embolus I26.09  History:        Patient has prior history of Echocardiogram examinations, most                 recent 07/28/2021. CHF, CAD, Prior CABG, TIA, Arrythmias:PVC;                 Risk Factors:Hypertension, Diabetes and Dyslipidemia.  Sonographer:    Alvino Chapel RCS Referring Phys: [id number] ASIA B Hanna  Sonographer Comments: Technically Difficult Study esp. the apical windows. IMPRESSIONS  1. Left ventricular ejection fraction, by estimation, is 60 to 65%. The left ventricle has normal function. The left ventricle has no regional wall motion abnormalities. There is moderate left ventricular hypertrophy. Left ventricular diastolic parameters are consistent with Grade I diastolic dysfunction (impaired relaxation).  2. Right ventricular systolic function is low normal. The right ventricular size is not well visualized. Tricuspid regurgitation signal is inadequate for assessing PA pressure.  3. The mitral valve is abnormal. Mild mitral valve regurgitation. No evidence of mitral stenosis. Moderate mitral annular calcification.  4. The aortic valve is tricuspid. There is mild calcification of the aortic valve. Aortic valve regurgitation is moderate. No aortic stenosis is present.  5. The inferior vena cava is normal in size with greater than 50% respiratory variability, suggesting right atrial pressure of 3 mmHg. Comparison(s): No significant change from prior study.  FINDINGS  Left Ventricle: Left ventricular ejection fraction, by estimation, is 60 to 65%. The left ventricle has normal function. The left ventricle has no regional wall motion abnormalities. Definity contrast agent was given IV to delineate the left ventricular  endocardial borders. The left ventricular internal cavity size was normal in size. There is moderate left ventricular hypertrophy. Left  ventricular diastolic parameters are consistent with Grade I diastolic dysfunction (impaired relaxation). Right Ventricle: The right ventricular size is not well visualized. No increase in right ventricular wall thickness. Right ventricular systolic function is low normal. Tricuspid regurgitation signal is inadequate for assessing PA pressure. Left Atrium: Left atrial size was normal in size. Right Atrium: Right atrial size was normal in size. Pericardium: There is no evidence of pericardial effusion. Mitral Valve: The mitral valve is abnormal. Moderate mitral annular calcification. Mild mitral valve regurgitation. No evidence of mitral valve stenosis. Tricuspid Valve: The tricuspid valve is not well visualized. Tricuspid valve regurgitation is not demonstrated. No evidence of tricuspid stenosis. Aortic Valve: The aortic valve is tricuspid. There is mild calcification of the aortic valve. Aortic valve regurgitation is moderate. Aortic regurgitation PHT measures 478 msec. No aortic stenosis is present. Pulmonic Valve: The pulmonic valve was not well visualized. Pulmonic valve regurgitation is not visualized. No evidence of pulmonic stenosis. Aorta: The aortic root is normal in size and structure. Venous: The inferior vena cava is normal in size with greater than 50% respiratory variability, suggesting right atrial pressure of 3 mmHg. IAS/Shunts: No atrial level shunt detected by color flow Doppler.  LEFT VENTRICLE PLAX 2D LVIDd:         4.40 cm   Diastology LVIDs:         3.00 cm   LV e' medial:    7.51 cm/s LV PW:          1.00 cm   LV E/e' medial:  10.8 LV IVS:        1.10 cm   LV e' lateral:   11.00 cm/s LVOT diam:     2.00 cm   LV E/e' lateral: 7.4 LV SV:         73 LV SV Index:   35 LVOT Area:     3.14 cm  RIGHT VENTRICLE RV S prime:     6.74 cm/s TAPSE (M-mode): 0.9 cm LEFT ATRIUM             Index        RIGHT ATRIUM           Index LA diam:        3.10 cm 1.47 cm/m   RA Area:     15.00 cm LA Vol (A2C):   48.2 ml 22.88 ml/m  RA Volume:   32.60 ml  15.47 ml/m LA Vol (A4C):   32.9 ml 15.62 ml/m LA Biplane Vol: 41.1 ml 19.51 ml/m  AORTIC VALVE LVOT Vmax:   102.00 cm/s LVOT Vmean:  72.700 cm/s LVOT VTI:    0.232 m AI PHT:      478 msec  AORTA Ao Root diam: 3.30 cm MITRAL VALVE MV Area (PHT): 2.56 cm     SHUNTS MV Decel Time: 296 msec     Systemic VTI:  0.23 m MV E velocity: 81.40 cm/s   Systemic Diam: 2.00 cm MV A velocity: 103.00 cm/s MV E/A ratio:  0.79 Vishnu Priya Mallipeddi Electronically signed by Lorelee Cover Mallipeddi Signature Date/Time: 12/28/2022/11:46:06 AM    Final    US Venous Img Lower Bilateral (DVT)  Result Date: 12/27/2022 CLINICAL DATA:  [id number] Acute pulmonary embolism ([facility]) [id number] EXAM: BILATERAL LOWER EXTREMITY VENOUS DOPPLER ULTRASOUND TECHNIQUE: Gray-scale sonography with graded compression, as well as color Doppler and duplex ultrasound were performed to evaluate the lower extremity deep venous systems from the level of the common femoral vein and including the common femoral,  femoral, profunda femoral, popliteal and calf veins including the posterior tibial, peroneal and gastrocnemius veins when visible. The superficial great saphenous vein was also interrogated. Spectral Doppler was utilized to evaluate flow at rest and with distal augmentation maneuvers in the common femoral, femoral and popliteal veins. COMPARISON:  CTA PE, 12/26/2022 FINDINGS: RIGHT LOWER EXTREMITY VENOUS Normal compressibility of the RIGHT common femoral, superficial femoral, and popliteal veins, as well as the visualized  calf veins. Visualized portions of profunda femoral vein and great saphenous vein unremarkable. No filling defects to suggest DVT on grayscale or color Doppler imaging. Doppler waveforms show normal direction of venous flow, normal respiratory plasticity and response to augmentation. OTHER No evidence of superficial thrombophlebitis or abnormal fluid collection. Limitations: none LEFT LOWER EXTREMITY VENOUS Heterogeneously hypoechoic partially-occlusive filling defect within the imaged portions of the LEFT profunda femoral vein. Normal compressibility of the LEFT common femoral, superficial femoral, and popliteal veins, as well as the visualized calf veins. Visualized portions of the great saphenous vein unremarkable. OTHER No evidence of superficial thrombophlebitis or abnormal fluid collection. Limitations: none IMPRESSION: 1. Examination is POSITIVE for LEFT lower extremity DVT within the profunda femoral vein. 2. No evidence of femoropopliteal DVT within the RIGHT lower extremity. Michaelle Birks, MD [facility] Radiology Electronically Signed   By: Michaelle Birks M.D.   On: 12/27/2022 13:25   CT HEAD WO CONTRAST (5MM)  Result Date: 12/27/2022 CLINICAL DATA:  Neuro deficit with acute stroke suspected left-sided weakness and numbness for a few hours. EXAM: CT HEAD WITHOUT CONTRAST TECHNIQUE: Contiguous axial images were obtained from the base of the skull through the vertex without intravenous contrast. RADIATION DOSE REDUCTION: This exam was performed according to the departmental dose-optimization program which includes automated exposure control, adjustment of the mA and/or kV according to patient size and/or use of iterative reconstruction technique. COMPARISON:  07/30/2022 brain MRI FINDINGS: Brain: No evidence of acute infarction, hemorrhage, hydrocephalus, extra-axial collection or mass lesion/mass effect. Tiny remote right cerebellar infarct Vascular: No  hyperdense vessel or unexpected calcification. Skull: Normal. Negative for fracture or focal lesion. Sinuses/Orbits: No acute finding. IMPRESSION: No acute finding. Electronically Signed   By: Jorje Guild M.D.   On: 12/27/2022 09:28   CT Angio Chest PE W and/or Wo Contrast  Result Date: 12/26/2022 CLINICAL DATA:  Pulmonary embolism suspected, high probability. Midsternal chest pain. EXAM: CT ANGIOGRAPHY CHEST WITH CONTRAST TECHNIQUE: Multidetector CT imaging of the chest was performed using the standard protocol during bolus administration of intravenous contrast. Multiplanar CT image reconstructions and MIPs were obtained to evaluate the vascular anatomy. RADIATION DOSE REDUCTION: This exam was performed according to the departmental dose-optimization program which includes automated exposure control, adjustment of the mA and/or kV according to patient size and/or use of iterative reconstruction technique. CONTRAST:  29m OMNIPAQUE IOHEXOL 350 MG/ML SOLN COMPARISON:  12/12/2021. FINDINGS: Cardiovascular: The heart is normal in size and there is no pericardial effusion. Multi-vessel coronary artery calcifications are noted. There is atherosclerotic calcification of the aorta without evidence of aneurysm. The pulmonary trunk is normal in caliber. Small pulmonary artery filling defects are noted in the segmental and subsegmental pulmonary arteries in the right middle lobe. Examination is limited due to respiratory motion artifact. No evidence of right heart strain. Mediastinum/Nodes: No mediastinal, hilar, or axillary lymphadenopathy by size criteria. Thyroid gland, trachea, and esophagus are within normal limits. Lungs/Pleura: Multifocal scattered ground-glass opacities are noted in the right lung and at the left lung base, greater on  the right than on the left. No effusion or pneumothorax. Upper Abdomen: There is fatty infiltration of the liver. The liver has a slightly lobular contour which may be  associated with underlying cirrhosis. Gallbladder is surgically absent. The spleen is enlarged, unchanged from prior exams Musculoskeletal: Sternotomy wires are noted. Degenerative changes in the thoracic spine. No acute osseous abnormality. Review of the MIP images confirms the above findings. IMPRESSION: 1. Small segmental and subsegmental pulmonary embolus in the right middle lobe. No evidence of right heart strain. 2. Scattered ground-glass opacities in the right lung and at the left lung base, likely infectious or inflammatory. 3. Hepatic steatosis. The liver has a slightly lobular contour, raising the possibility of underlying cirrhosis. 4. Splenomegaly. 5. Aortic atherosclerosis and coronary artery calcifications. Critical Value/emergent results were called by telephone at the time of interpretation on 12/26/2022 at 10:53 pm to provider JOSEPH ZAMMIT , who verbally acknowledged these results. Electronically Signed   By: Brett Fairy M.D.   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 Cardiac silhouette is unremarkable. No airspace disease, effusion, or pneumothorax. No acute bony abnormality. IMPRESSION: 1. Stable chest, no acute process. Electronically Signed   By: Randa Ngo M.D.   On: 12/26/2022 22:01   DG Chest 2 View  Result Date: 12/22/2022 CLINICAL DATA:  Chest pain EXAM: CHEST - 2 VIEW COMPARISON:  12/09/2022 FINDINGS: Lungs are clear. No pneumothorax or pleural effusion. Coronary artery bypass grafting has been performed. Cardiac size within normal limits. Osseous structures are age-appropriate. IMPRESSION: 1. No active cardiopulmonary disease. Electronically Signed   By: Fidela Salisbury M.D.   On: 12/22/2022 20:52   DG Chest 2 View  Result Date: 12/09/2022 CLINICAL DATA:  Chest pain with  intermittent chest tightness and shortness of breath for several days. EXAM: CHEST - 2 VIEW COMPARISON:  Radiographs 08/14/2022 and 09/03/2021.  CT 12/12/2021. FINDINGS: The heart size and mediastinal contours appear stable status post median sternotomy and CABG. The lungs are clear. There is no pleural effusion or pneumothorax. No acute osseous findings are evident. Degenerative changes are present in the spine. IMPRESSION: Stable chest. No acute cardiopulmonary process. Previous CABG. Electronically Signed   By: Richardean Sale M.D.   On: 12/09/2022 13:28     Subjective: Pt reports chest pain is resolved now.   He is having no bleeding complications.  He feels ready to go home.   He is ambulating with very minimal SOB today.    Discharge Exam: Vitals:   12/29/22 0821 12/29/22 0855  BP:  (!) 143/78  Pulse:  73  Resp:  18  Temp:  98.7 F (37.1 C)  SpO2: 93% 95%   Vitals:   12/28/22 2038 12/29/22 0312 12/29/22 0821 12/29/22 0855  BP: (!) 169/86 138/77  (!) 143/78  Pulse: 73 67  73  Resp: 18 18  18  $ Temp: 98.1 F (36.7 C) 97.7 F (36.5 C)  98.7 F (37.1 C)  TempSrc:    Oral  SpO2: 99% 99% 93% 95%  Weight:      Height:       General: Pt is alert, awake, not in acute distress Cardiovascular: normal S1/S2 +, no rubs, no gallops Respiratory: CTA bilaterally, no wheezing, no rhonchi Abdominal: Soft, NT, ND, bowel sounds + Extremities: trace pretibial edema, no  cyanosis   The results of significant diagnostics from this hospitalization (including imaging, microbiology, ancillary and laboratory) are listed below for reference.     Microbiology: Recent Results (from the past 240 hour(s))  Resp panel by RT-PCR (RSV, Flu A&B, Covid) Anterior Nasal Swab     Status: None   Collection Time: 12/22/22  9:44 PM   Specimen: Anterior Nasal Swab  Result Value Ref Range Status   SARS Coronavirus 2 by RT PCR NEGATIVE NEGATIVE Final    Comment: (NOTE) SARS-CoV-2 target nucleic acids are NOT  DETECTED.  The SARS-CoV-2 RNA is generally detectable in upper respiratory specimens during the acute phase of infection. The lowest concentration of SARS-CoV-2 viral copies this assay can detect is 138 copies/mL. A negative result does not preclude SARS-Cov-2 infection and should not be used as the sole basis for treatment or other patient management decisions. A negative result may occur with  improper specimen collection/handling, submission of specimen other than nasopharyngeal swab, presence of viral mutation(s) within the areas targeted by this assay, and inadequate number of viral copies(<138 copies/mL). A negative result must be combined with clinical observations, patient history, and epidemiological information. The expected result is Negative.  Fact Sheet for Patients:  EntrepreneurPulse.com.au  Fact Sheet for Healthcare Providers:  IncredibleEmployment.be  This test is no t yet approved or cleared by the [place] FDA and  has been authorized for detection and/or diagnosis of SARS-CoV-2 by FDA under an Emergency Use Authorization (EUA). This EUA will remain  in effect (meaning this test can be used) for the duration of the COVID-19 declaration under Section 564(b)(1) of the Act, 21 U.S.C.section 360bbb-3(b)(1), unless the authorization is terminated  or revoked sooner.       Influenza A by PCR NEGATIVE NEGATIVE Final   Influenza B by PCR NEGATIVE NEGATIVE Final    Comment: (NOTE) The Xpert Xpress SARS-CoV-2/FLU/RSV plus assay is intended as an aid in the diagnosis of influenza from Nasopharyngeal swab specimens and should not be used as a sole basis for treatment. Nasal washings and aspirates are unacceptable for Xpert Xpress SARS-CoV-2/FLU/RSV testing.  Fact Sheet for Patients: EntrepreneurPulse.com.au  Fact Sheet for Healthcare Providers: IncredibleEmployment.be  This test is not yet  approved or cleared by the [place] FDA and has been authorized for detection and/or diagnosis of SARS-CoV-2 by FDA under an Emergency Use Authorization (EUA). This EUA will remain in effect (meaning this test can be used) for the duration of the COVID-19 declaration under Section 564(b)(1) of the Act, 21 U.S.C. section 360bbb-3(b)(1), unless the authorization is terminated or revoked.     Resp Syncytial Virus by PCR NEGATIVE NEGATIVE Final    Comment: (NOTE) Fact Sheet for Patients: EntrepreneurPulse.com.au  Fact Sheet for Healthcare Providers: IncredibleEmployment.be  This test is not yet approved or cleared by the [place] FDA and has been authorized for detection and/or diagnosis of SARS-CoV-2 by FDA under an Emergency Use Authorization (EUA). This EUA will remain in effect (meaning this test can be used) for the duration of the COVID-19 declaration under Section 564(b)(1) of the Act, 21 U.S.C. section 360bbb-3(b)(1), unless the authorization is terminated or revoked.  Performed at [facility], [address] [place], [place] [address]      Labs: BNP (last 3 results) No results for input(s): "BNP" in the last 8760 hours. Basic Metabolic Panel: Recent Labs  Lab 12/22/22 2002 12/26/22 2128 12/27/22 0649 12/28/22 0748  NA 137 135 135 136  K 3.6 3.4* 3.2* 3.6  CL 105 107 108 106  CO2 21* 22 20* 20*  GLUCOSE 122* 129* 110* 145*  BUN 17 15 16 15  $ CREATININE 1.21 1.02 0.91 0.96  CALCIUM 8.6* 9.0 8.8* 8.6*  MG  --   --  1.8  --    Liver Function Tests: Recent Labs  Lab 12/26/22 2128 12/27/22 0649  AST 19 18  ALT 24 21  ALKPHOS 49 47  BILITOT 1.1 1.0  PROT 7.3 7.0  ALBUMIN 3.4* 3.2*   No results for input(s): "LIPASE", "AMYLASE" in the last 168 hours. No results for input(s): "AMMONIA" in the last 168 hours. CBC: Recent Labs  Lab 12/22/22 2002 12/26/22 2128 12/27/22 0649 12/28/22 0432 12/29/22 0359  WBC  2.4* 4.5 3.7* 2.7* 3.4*  NEUTROABS  --  2.9 2.0  --   --   HGB 14.7 15.1 14.2 14.0 14.7  HCT 41.3 43.1 40.1 39.5 42.2  MCV 89.0 89.8 88.9 88.0 89.2  PLT 68* 77* 77* 76* 91*   Cardiac Enzymes: No results for input(s): "CKTOTAL", "CKMB", "CKMBINDEX", "TROPONINI" in the last 168 hours. BNP: Invalid input(s): "POCBNP" CBG: Recent Labs  Lab 12/28/22 1213 12/28/22 1613 12/28/22 2039 12/29/22 0314 12/29/22 0731  GLUCAP 196* 170* 167* 140* 172*   D-Dimer No results for input(s): "DDIMER" in the last 72 hours. Hgb A1c No results for input(s): "HGBA1C" in the last 72 hours. Lipid Profile No results for input(s): "CHOL", "HDL", "LDLCALC", "TRIG", "CHOLHDL", "LDLDIRECT" in the last 72 hours. Thyroid function studies No results for input(s): "TSH", "T4TOTAL", "T3FREE", "THYROIDAB" in the last 72 hours.  Invalid input(s): "FREET3" Anemia work up No results for input(s): "VITAMINB12", "FOLATE", "FERRITIN", "TIBC", "IRON", "RETICCTPCT" in the last 72 hours. Urinalysis    Component Value Date/Time   COLORURINE YELLOW 07/30/2022 1720   APPEARANCEUR CLEAR 07/30/2022 1720   LABSPEC 1.027 07/30/2022 1720   PHURINE 6.0 07/30/2022 1720   GLUCOSEU NEGATIVE 07/30/2022 1720   HGBUR SMALL (A) 07/30/2022 1720   BILIRUBINUR NEGATIVE 07/30/2022 1720   KETONESUR NEGATIVE 07/30/2022 1720   PROTEINUR 30 (A) 07/30/2022 1720   UROBILINOGEN 0.2 12/04/2013 0730   NITRITE NEGATIVE 07/30/2022 1720   LEUKOCYTESUR NEGATIVE 07/30/2022 1720   Sepsis Labs Recent Labs  Lab 12/26/22 2128 12/27/22 0649 12/28/22 0432 12/29/22 0359  WBC 4.5 3.7* 2.7* 3.4*   Microbiology Recent Results (from the past 240 hour(s))  Resp panel by RT-PCR (RSV, Flu A&B, Covid) Anterior Nasal Swab     Status: None   Collection Time: 12/22/22  9:44 PM   Specimen: Anterior Nasal Swab  Result Value Ref Range Status   SARS Coronavirus 2 by RT PCR NEGATIVE NEGATIVE Final    Comment: (NOTE) SARS-CoV-2 target nucleic acids are  NOT DETECTED.  The SARS-CoV-2 RNA is generally detectable in upper respiratory specimens during the acute phase of infection. The lowest concentration of SARS-CoV-2 viral copies this assay can detect is 138 copies/mL. A negative result does not preclude SARS-Cov-2 infection and should not be used as the sole basis for treatment or other patient management decisions. A negative result may occur with  improper specimen collection/handling, submission of specimen other than nasopharyngeal swab, presence of viral mutation(s) within the areas targeted by this assay, and inadequate number of viral copies(<138 copies/mL). A negative result must be combined with clinical observations, patient history, and epidemiological information. The expected result is Negative.  Fact Sheet for Patients:  EntrepreneurPulse.com.au  Fact Sheet for Healthcare Providers:  IncredibleEmployment.be  This test is no t yet  approved or cleared by the [place] and  has been authorized for detection and/or diagnosis of SARS-CoV-2 by FDA under an Emergency Use Authorization (EUA). This EUA will remain  in effect (meaning this test can be used) for the duration of the COVID-19 declaration under Section 564(b)(1) of the Act, 21 U.S.C.section 360bbb-3(b)(1), unless the authorization is terminated  or revoked sooner.       Influenza A by PCR NEGATIVE NEGATIVE Final   Influenza B by PCR NEGATIVE NEGATIVE Final    Comment: (NOTE) The Xpert Xpress SARS-CoV-2/FLU/RSV plus assay is intended as an aid in the diagnosis of influenza from Nasopharyngeal swab specimens and should not be used as a sole basis for treatment. Nasal washings and aspirates are unacceptable for Xpert Xpress SARS-CoV-2/FLU/RSV testing.  Fact Sheet for Patients: EntrepreneurPulse.com.au  Fact Sheet for Healthcare Providers: IncredibleEmployment.be  This test is not  yet approved or cleared by the [place] FDA and has been authorized for detection and/or diagnosis of SARS-CoV-2 by FDA under an Emergency Use Authorization (EUA). This EUA will remain in effect (meaning this test can be used) for the duration of the COVID-19 declaration under Section 564(b)(1) of the Act, 21 U.S.C. section 360bbb-3(b)(1), unless the authorization is terminated or revoked.     Resp Syncytial Virus by PCR NEGATIVE NEGATIVE Final    Comment: (NOTE) Fact Sheet for Patients: EntrepreneurPulse.com.au  Fact Sheet for Healthcare Providers: IncredibleEmployment.be  This test is not yet approved or cleared by the [place] FDA and has been authorized for detection and/or diagnosis of SARS-CoV-2 by FDA under an Emergency Use Authorization (EUA). This EUA will remain in effect (meaning this test can be used) for the duration of the COVID-19 declaration under Section 564(b)(1) of the Act, 21 U.S.C. section 360bbb-3(b)(1), unless the authorization is terminated or revoked.  Performed at [facility], [address], [place], [place] [address]    Time coordinating discharge:  41 mins   SIGNED:  Irwin Brakeman, MD  [facility] 12/29/2022, 12:04 PM How to contact the [name] Attending or Consulting provider [place] or covering provider during after hours [place], for this patient?  Check the care team in [facility] and look for a) attending/consulting [facility] provider listed and b) the [facility] team listed Log into [email or web address] and use [place]'s universal password to access. If you do not have the password, please contact the hospital operator. Locate the [facility] provider you are looking for under [facility] and page to a number that you can be directly reached. If you still have difficulty reaching the provider, please page the [facility] (Director on Call) for the Hospitalists listed on amion for assistance.

## 2022-12-29 NOTE — Progress Notes (Addendum)
Patient discharged home with instructions given on medications and follow up visits, patient verbalized understanding.IV discontinued,catheter intact. Prescriptions sent to Pharmacy of choice documented on AVS. Accompanied by staff to an awaiting vehicle.

## 2022-12-29 NOTE — Consult Note (Addendum)
Cardiology Consultation   Patient ID: RECE CHOWNING MRN: [id number]; DOB: August 14, 1956  Admit date: 12/26/2022 Date of Consult: 12/29/2022  PCP:  [name], [place] [facility] Cardiologist:  Rozann Lesches, MD        Patient Profile:   KORVIN WOODCOCK is a 67 y.o. male with a hx of CAD s/p multiple prior stents then CABG 2017, HTN, HLD, chronic HFpEF, mild-moderate AI, anxiety, arthritis, cirrhosis, fatty liver, GERD, hiatal hernia, iron deficiency, OSA, ?stroke 2021 per DC summary (MRI brain wnl), prior TIAs, chronic appearing thrombocytopenia (last seen by heme-onc in 2022) who is being seen 12/29/2022 for the evaluation of antiplatelet management at the request of Dr. Wynetta Emery.  History of Present Illness:   Mr. Palomares follows with Dr. Domenic Polite with the above cardiac history with last cath 08/2022 with 3V CAD with 3/3 patent bypass grafts, medical therapy recommended, prompted by office visit with chest pain. He had additional evaluations recently for chest pain since then, with a recurrence of a different type of discomfort beginning around end of January, described as heaviness associated with exertion, with accompanied SOB. He was seen in [facility] 12/09/22 with negative troponins. He was seen back in the office 12/11/22 with worsening DOE, chest pain, and dizziness. 2D Echo was ordered and he was started on Ranexa. He came down with Covid infection last week and started on Paxlovid. He had a virtual visit 12/25/22 with Cadence Kathlen Mody PA-C at which time patient reported worsening chest pain, SOB, and hypoxia by home pulse ox. He was advised to go to the ER. CXR was unremarkable but CTA showed + PE in RML, no right heart strain, scattered GGO L lung base, hepatic steatosis/possible cirrhosis, splenomegaly, atherosclerosis. LE venous duplex + LLL DVT. HE was subsequently admitted and started on apixaban. DUring admission he had stroke-like symptoms with left sided numbness  lasting several hours, CT head nonacute, symptoms resolved without intervention. 2D Echo 12/27/22 EF 60-65%, G1DD, low normal RVSF, mild MR, moderate AI.Due to presence of thrombocytopenia on labs, cardiology asked to weigh in on continuation of ASA given new initiation of Eliquis. Platelet count is 91 today with recent nadir of 68 - this appears to be a chronic process. Troponins negative. He denies any prior liver bx. Had previously been seen at cancer center for low platelet count. Denies any residual CP, breathing better. No recent surgery or prolonged travel. Denies any bleeding.  Child Pugh Class A by calculation (prior INR was 5 months ago at 1.2).  Past Medical History:  Diagnosis Date   Anxiety    Aortic insufficiency    Arthritis    Asthma    Cataract    Right eye   Chronic lower back pain    Cirrhosis of liver ([place])    Coronary atherosclerosis of native coronary artery    a. s/p multiple prior stents, s/p CABG in 01/2016 with LIMA-LAD, Free RIMA-OM2 and SVG-PDA b. 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   LIMA to LAD, free RIMA to OM2, SVG  to PDA, open SVG harvest from right thigh   Stroke ([place])    Thrombocytopenia ([place])    TIA (transient ischemic attack)    Type 2 diabetes mellitus ([place])     Past Surgical History:  Procedure Laterality Date   ANTERIOR CERVICAL DECOMP/DISCECTOMY FUSION  1998   "C3-4"   CARDIAC CATHETERIZATION  "several"   CARDIAC CATHETERIZATION N/A 12/30/2015   Procedure: Left Heart Cath and Coronary Angiography;  Surgeon: [name], MD;  Location: [facility];  Service: Cardiovascular;  Laterality: N/A;   CARPAL TUNNEL  RELEASE Bilateral 2005   CHOLECYSTECTOMY N/A 11/28/2018   Procedure: LAPAROSCOPIC CHOLECYSTECTOMY;  Surgeon: [name], MD;  Location: [facility];  Service: General;  Laterality: N/A;   COLONOSCOPY     COLONOSCOPY WITH PROPOFOL N/A 09/27/2020   Procedure: COLONOSCOPY WITH PROPOFOL;  Surgeon: [name], MD;  [facility];  Service: Gastroenterology;  Laterality: N/A;   CORONARY ANGIOPLASTY     CORONARY ANGIOPLASTY WITH STENT PLACEMENT  2002; 2003; 11/20/2014   "I have 4 stents after today" (11/20/2014)   CORONARY ARTERY BYPASS GRAFT N/A 01/09/2016   Procedure: CORONARY ARTERY BYPASS GRAFTING (CABG) X 3 UTILIZING RIGHT AND LEFT INTERNAL MAMMARY ARTERY AND ENDOSCOPICALLY HARVESTED SAPHENEOUS VEIN.;  Surgeon: Rexene Alberts, MD;  Location: [facility];  Service: Open Heart Surgery;  Laterality: N/A;   ESOPHAGOGASTRODUODENOSCOPY     ESOPHAGOGASTRODUODENOSCOPY (EGD) WITH PROPOFOL N/A 09/27/2020   Procedure: ESOPHAGOGASTRODUODENOSCOPY (EGD) WITH PROPOFOL;  Surgeon: [name], MD;  [facility];  Service: Gastroenterology;  Laterality: N/A;  10   KNEE SURGERY Left 02/2012   "scraped; open"   LEFT HEART CATH AND CORS/GRAFTS ANGIOGRAPHY N/A 08/09/2017   Procedure: LEFT HEART CATH AND CORS/GRAFTS ANGIOGRAPHY;  Surgeon: [name], MD;  Location: [facility];  Service: Cardiovascular;  Laterality: N/A;   LEFT HEART CATH AND CORS/GRAFTS ANGIOGRAPHY N/A 11/22/2018   Procedure: LEFT HEART CATH AND CORS/GRAFTS ANGIOGRAPHY;  Surgeon: [name], [name], MD;  Location: [facility];  Service: Cardiovascular;  Laterality: N/A;   LEFT HEART CATH AND CORS/GRAFTS ANGIOGRAPHY N/A 08/20/2022   Procedure: LEFT HEART CATH AND CORS/GRAFTS ANGIOGRAPHY;  Surgeon: Burnell Blanks, MD;  Location: [facility];  Service: Cardiovascular;  Laterality: N/A;   LEFT HEART CATHETERIZATION WITH CORONARY ANGIOGRAM N/A 07/20/2012   Procedure: LEFT HEART  CATHETERIZATION WITH CORONARY ANGIOGRAM;  Surgeon: [name], MD;  Location: [facility];  Service: Cardiovascular;  Laterality: N/A;   LEFT HEART CATHETERIZATION WITH CORONARY ANGIOGRAM N/A 11/20/2014   Procedure: LEFT HEART CATHETERIZATION WITH CORONARY ANGIOGRAM;  Surgeon: [name], MD;  Location: [facility] C[facility] LAB;  Service: Cardiovascular;  Laterality: N/A;   LEFT HEART CATHETERIZATION WITH CORONARY ANGIOGRAM N/A 11/26/2014   Procedure: LEFT HEART CATHETERIZATION WITH CORONARY ANGIOGRAM;  Surgeon: [name], MD;  Location: [facility] C[facility] LAB;  Service: Cardiovascular;  Laterality: N/A;   NEUROPLASTY / TRANSPOSITION ULNAR NERVE AT ELBOW Right ~ 2012   PERCUTANEOUS CORONARY ROTOBLATOR INTERVENTION (PCI-R)  11/20/2014   Procedure: PERCUTANEOUS CORONARY ROTOBLATOR INTERVENTION (PCI-R);  Surgeon: [name], MD;  Location: [facility] CATH LAB;  Service: Cardiovascular;;   POSTERIOR CERVICAL LAMINECTOMY Left 04/16/2022   Procedure: Laminectomy and Foraminotomy - left - C6-C7;  Surgeon: Earnie Larsson, MD;  Location: [place] [facility];  Service: Neurosurgery;  Laterality: Left;  3C   SHOULDER ARTHROSCOPY Left ~ 2011   TEE WITHOUT CARDIOVERSION N/A 01/09/2016   Procedure: TRANSESOPHAGEAL ECHOCARDIOGRAM (  TEE);  Surgeon: Rexene Alberts, MD;  Location: [place];  Service: Open Heart Surgery;  Laterality: N/A;     Home Medications:  Prior to Admission medications   Medication Sig Start Date End Date Taking? Authorizing Provider  albuterol (VENTOLIN HFA) 108 (90 Base) MCG/ACT inhaler Inhale 2 puffs into the lungs every 6 (six) hours as needed for wheezing or shortness of breath.   Yes [provider]  aspirin EC 81 MG tablet Take 81 mg by mouth daily. Swallow whole.   Yes [provider]  citalopram (CELEXA) 20 MG tablet Take 1 tablet (20 mg total) by mouth at bedtime. 10/21/22  Yes [name], MD  cyclobenzaprine (FLEXERIL) 10 MG tablet Take 1 tablet (10 mg total) by mouth 3 (three) times daily as  needed for muscle spasms. 10/21/22  Yes [name], MD  dapagliflozin propanediol (FARXIGA) 5 MG TABS tablet Take 1 tablet (5 mg total) by mouth daily before breakfast. 12/11/22 03/11/23 Yes [name], MD  ezetimibe (ZETIA) 10 MG tablet Take 1 tablet (10 mg total) by mouth daily. 10/21/22  Yes [name], MD  fluticasone (FLONASE) 50 MCG/ACT nasal spray Place 2 sprays into both nostrils 2 (two) times daily. 10/21/22  Yes [name], MD  Fluticasone-Salmeterol (ADVAIR) 250-50 MCG/DOSE AEPB Inhale 1 puff into the lungs every 12 (twelve) hours.   Yes [provider]  gabapentin (NEURONTIN) 300 MG capsule Take 1 capsule (300 mg total) by mouth 3 (three) times daily. Take 300 mg by mouth 3 (three) times daily. 10/21/22  Yes [name], MD  HUMALOG KWIKPEN 100 UNIT/ML KwikPen Inject 5-30 Units into the skin 3 (three) times daily as needed (blood sugar of 200 or higher). Sliding scale 10/21/22  Yes [name], MD  isosorbide mononitrate (IMDUR) 60 MG 24 hr tablet Take one (1) tablet by mouth ( 60 mg ) in the am and one half tablet ( 30 mg) in the pm. 10/21/22  Yes [name], MD  Melatonin 10 MG TABS Take 10 mg by mouth at bedtime.   Yes [provider]  metFORMIN (GLUCOPHAGE) 1000 MG tablet Take 1 tablet (1,000 mg total) by mouth 2 (two) times daily. 10/21/22  Yes [name], MD  nirmatrelvir/ritonavir (PAXLOVID) 20 x 150 MG & 10 x 100MG TABS Take 3 tablets by mouth 2 (two) times daily for 5 days. (Take nirmatrelvir 150 mg two tablets twice daily for 5 days and ritonavir 100 mg one tablet twice daily for 5 days) Patient GFR is > 60 12/24/22 12/29/22 Yes Dixon, Hazle Nordmann, MD  nitroGLYCERIN (NITROSTAT) 0.4 MG SL tablet Place 1 tablet (0.4 mg total) under the tongue every 5 (five) minutes x 3 doses as needed for chest pain (if no relief after 2nd dose, proceed to the ED for an evaluation or call 911). Place 0.4 mg under the tongue every 5 (five) minutes  x 3 doses as needed for chest pain (if no relief after 2nd dose, proceed to the ED for an evaluation or call 911). 10/21/22  Yes [name], MD  omeprazole (PRILOSEC) 40 MG capsule Take 1 capsule (40 mg total) by mouth daily. 10/21/22  Yes [name], MD  polycarbophil (FIBERCON) 625 MG tablet Take 625 mg by mouth daily.   Yes [provider]  pravastatin (PRAVACHOL) 20 MG tablet Take 1 tablet (20 mg total) by mouth daily. 10/21/22  Yes [name], MD  ranolazine [facility])  500 MG 12 hr tablet Take 1 tablet (500 mg total) by mouth 2 (two) times daily. 12/11/22  Yes Hammock, Barbera Setters, NP  tirzepatide [facility]) 5 MG/0.5ML Pen Inject 5 mg into the skin once a week. 12/11/22  Yes [name], MD  topiramate (TOPAMAX) 50 MG tablet Take 50 mg by mouth 2 (two) times daily. 11/20/22  Yes [provider]  TRESIBA FLEXTOUCH 100 UNIT/ML FlexTouch Pen Inject 65 Units into the skin in the morning and at bedtime. Inject 60 units in the AM and 70 units in the PM. 10/21/22  Yes Dixon, Hazle Nordmann, MD  carvedilol (COREG) 3.125 MG tablet Take 1 tablet (3.125 mg total) by mouth 2 (two) times daily. 10/21/22   [name], MD  Continuous Blood Gluc Receiver (FREESTYLE LIBRE 2 READER) DEVI 1 Device by Does not apply route continuous. 10/21/22   [name], MD  Continuous Blood Gluc Sensor (FREESTYLE LIBRE 2 SENSOR) MISC Apply one sensor every 14 days 12/11/22   [name], MD    Inpatient Medications: Scheduled Meds:  apixaban  10 mg Oral BID   Followed by   Derrill Memo ON 01/04/2023] apixaban  5 mg Oral BID   aspirin EC  81 mg Oral Daily   carvedilol  3.125 mg Oral BID   citalopram  20 mg Oral QHS   ezetimibe  10 mg Oral Daily   fluticasone  2 spray Each Nare BID   gabapentin  300 mg Oral TID   insulin aspart  0-15 Units Subcutaneous TID WC   insulin aspart  0-5 Units Subcutaneous QHS   insulin aspart  3 Units Subcutaneous TID WC   isosorbide mononitrate  30 mg Oral QHS    isosorbide mononitrate  60 mg Oral Daily   mometasone-formoterol  2 puff Inhalation BID   pantoprazole  40 mg Oral Daily   pravastatin  20 mg Oral Daily   topiramate  50 mg Oral BID   Continuous Infusions:  PRN Meds: acetaminophen **OR** acetaminophen, albuterol, guaiFENesin-dextromethorphan, morphine injection, ondansetron **OR** ondansetron (ZOFRAN) IV, oxyCODONE  Allergies:    Allergies  Allergen Reactions   Divalproex Sodium Other (See Comments)    Causes anger   Statins Other (See Comments)    Muscle aches and cramps   Tramadol Other (See Comments)    Chest pain    Valproic Acid Other (See Comments)    Causes anger   Gadolinium Derivatives Nausea And Vomiting    07/25/19 Pt vomited immediately after IV gad. Denies itching, dyspnea.  (Adverse, not allergic, reaction   Tricor [Fenofibrate] Other (See Comments)    Leg cramps    Social History:   Social History   Socioeconomic History   Marital status: Married    Spouse name: [name]   Number of children: 3   Years of education: 12 th    Highest education level: Not on file  Occupational History   Occupation: Unemployed  Tobacco Use   Smoking status: Never   Smokeless tobacco: Never  Vaping Use   Vaping Use: Never used  Substance and Sexual Activity   Alcohol use: No   Drug use: No   Sexual activity: Not on file  Other Topics Concern   Not on file  Social History Narrative   Patient lives at home with wife [name].   Patient works at [employer], Engineer, petroleum.   Patient has a 12 th grade education.    Patient has 3 children.  Social Determinants of Health   Financial Resource Strain: Low Risk  (11/23/2022)   Overall Financial Resource Strain (CARDIA)    Difficulty of Paying Living Expenses: Not hard at all  Food Insecurity: No Food Insecurity (12/27/2022)   Hunger Vital Sign    Worried About Running Out of Food in the Last Year: Never true    Ran Out of Food in the Last Year: Never  true  Transportation Needs: No Transportation Needs (12/27/2022)   PRAPARE - Hydrologist (Medical): No    Lack of Transportation (Non-Medical): No  Physical Activity: Sufficiently Active (11/23/2022)   Exercise Vital Sign    Days of Exercise per Week: 7 days    Minutes of Exercise per Session: 30 min  Stress: No Stress Concern Present (11/23/2022)   [place]    Feeling of Stress : Not at all  Social Connections: [place] (02/25/2021)   Social Connection and Isolation Panel [NHANES]    Frequency of Communication with Friends and Family: More than three times a week    Frequency of Social Gatherings with Friends and Family: More than three times a week    Attends Religious Services: More than 4 times per year    Active Member of [employer] or Organizations: Yes    Attends Music therapist: More than 4 times per year    Marital Status: Married  Human resources officer Violence: Not At Risk (12/27/2022)   Humiliation, Afraid, Rape, and Kick questionnaire    Fear of Current or Ex-Partner: No    Emotionally Abused: No    Physically Abused: No    Sexually Abused: No    Family History:    Family History  Problem Relation Age of Onset   Stroke Mother    Coronary artery disease Father 50   Asthma Sister    Multiple sclerosis Brother    Heart disease Paternal Uncle    Stroke Maternal Grandmother    Heart attack Paternal Grandmother    Cancer Paternal Grandfather    Asthma Sister    Seizures Son    Migraines Son    Autism Son    Migraines Son      ROS:  Please see the history of present illness.   All other ROS reviewed and negative.     Physical Exam/Data:   Vitals:   12/28/22 2018 12/28/22 2038 12/29/22 0312 12/29/22 0821  BP:  (!) 169/86 138/77   Pulse:  73 67   Resp:  18 18   Temp:  98.1 F (36.7 C) 97.7 F (36.5 C)   TempSrc:      SpO2: 97% 99% 99% 93%   Weight:      Height:        Intake/Output Summary (Last 24 hours) at 12/29/2022 0838 Last data filed at 12/29/2022 0554 Gross per 24 hour  Intake 960 ml  Output --  Net 960 ml      12/27/2022   12:11 AM 12/26/2022    9:13 PM 12/25/2022    1:05 PM  Last 3 Weights  Weight (lbs) 209 lb 7 oz 214 lb 214 lb  Weight (kg) 95 kg 97.07 kg 97.07 kg     Body mass index is 30.93 kg/m.  General: Well developed, well nourished, in no acute distress. Head: Normocephalic, atraumatic, sclera non-icteric, no xanthomas, nares are without discharge. Neck: Negative for carotid bruits. JVP not elevated. Lungs: Clear bilaterally to auscultation  without wheezes, rales, or rhonchi. Breathing is unlabored. Heart: RRR S1 S2 without murmurs, rubs, or gallops.  Abdomen: Soft, non-tender, non-distended with normoactive bowel sounds. No rebound/guarding. Extremities: No clubbing or cyanosis. No edema. Distal pedal pulses are 2+ and equal bilaterally. Neuro: Alert and oriented X 3. Moves all extremities spontaneously. Psych:  Responds to questions appropriately with a normal affect.   EKG:  The EKG was personally reviewed and demonstrates:  NSR 90bpm, nonspecific TW change III otherwise nonacute Telemetry:  Telemetry was personally reviewed and demonstrates:  NSR  Relevant CV Studies: Echo 12/27/22   1. Left ventricular ejection fraction, by estimation, is 60 to 65%. The  left ventricle has normal function. The left ventricle has no regional  wall motion abnormalities. There is moderate left ventricular hypertrophy.  Left ventricular diastolic  parameters are consistent with Grade I diastolic dysfunction (impaired  relaxation).   2. Right ventricular systolic function is low normal. The right  ventricular size is not well visualized. Tricuspid regurgitation signal is  inadequate for assessing PA pressure.   3. The mitral valve is abnormal. Mild mitral valve regurgitation. No  evidence of mitral stenosis.  Moderate mitral annular calcification.   4. The aortic valve is tricuspid. There is mild calcification of the  aortic valve. Aortic valve regurgitation is moderate. No aortic stenosis  is present.   5. The inferior vena cava is normal in size with greater than 50%  respiratory variability, suggesting right atrial pressure of 3 mmHg.   Comparison(s): No significant change from prior study.    Laboratory Data:  High Sensitivity Troponin:   Recent Labs  Lab 12/09/22 1546 12/22/22 2002 12/22/22 2146 12/26/22 2128 12/26/22 2305  TROPONINIHS 3 3 3 3 3     $ Chemistry Recent Labs  Lab 12/26/22 2128 12/27/22 0649 12/28/22 0748  NA 135 135 136  K 3.4* 3.2* 3.6  CL 107 108 106  CO2 22 20* 20*  GLUCOSE 129* 110* 145*  BUN 15 16 15  $ CREATININE 1.02 0.91 0.96  CALCIUM 9.0 8.8* 8.6*  MG  --  1.8  --   GFRNONAA >60 >60 >60  ANIONGAP 6 7 10    $ Recent Labs  Lab 12/26/22 2128 12/27/22 0649  PROT 7.3 7.0  ALBUMIN 3.4* 3.2*  AST 19 18  ALT 24 21  ALKPHOS 49 47  BILITOT 1.1 1.0   Lipids No results for input(s): "CHOL", "TRIG", "HDL", "LABVLDL", "LDLCALC", "CHOLHDL" in the last 168 hours.  Hematology Recent Labs  Lab 12/27/22 0649 12/28/22 0432 12/29/22 0359  WBC 3.7* 2.7* 3.4*  RBC 4.51 4.49 4.73  HGB 14.2 14.0 14.7  HCT 40.1 39.5 42.2  MCV 88.9 88.0 89.2  MCH 31.5 31.2 31.1  MCHC 35.4 35.4 34.8  RDW 14.1 13.8 13.9  PLT 77* 76* 91*   Thyroid No results for input(s): "TSH", "FREET4" in the last 168 hours.  BNPNo results for input(s): "BNP", "PROBNP" in the last 168 hours.  DDimer No results for input(s): "DDIMER" in the last 168 hours.   Radiology/Studies:  ECHOCARDIOGRAM COMPLETE  Result Date: 12/28/2022    ECHOCARDIOGRAM REPORT   Patient Name:   EMERY DEKAM Date of Exam: 12/27/2022 Medical Rec #:  [id number]       Height:       69.0 in Accession #:    [id number]      Weight:       209.4 lb Date of Birth:  11-Sep-1956       BSA:  2.107 m Patient Age:     89 years        BP:           140/78 mmHg Patient Gender: M               HR:           70 bpm. Exam Location:  [facility] Procedure: 2D Echo, Cardiac Doppler and Color Doppler Indications:    Pulmonary Embolus I26.09  History:        Patient has prior history of Echocardiogram examinations, most                 recent 07/28/2021. CHF, CAD, Prior CABG, TIA, Arrythmias:PVC;                 Risk Factors:Hypertension, Diabetes and Dyslipidemia.  Sonographer:    Alvino Chapel RCS Referring Phys: [id number] ASIA B [place]  Sonographer Comments: Technically Difficult Study esp. the apical windows. IMPRESSIONS  1. Left ventricular ejection fraction, by estimation, is 60 to 65%. The left ventricle has normal function. The left ventricle has no regional wall motion abnormalities. There is moderate left ventricular hypertrophy. Left ventricular diastolic parameters are consistent with Grade I diastolic dysfunction (impaired relaxation).  2. Right ventricular systolic function is low normal. The right ventricular size is not well visualized. Tricuspid regurgitation signal is inadequate for assessing PA pressure.  3. The mitral valve is abnormal. Mild mitral valve regurgitation. No evidence of mitral stenosis. Moderate mitral annular calcification.  4. The aortic valve is tricuspid. There is mild calcification of the aortic valve. Aortic valve regurgitation is moderate. No aortic stenosis is present.  5. The inferior vena cava is normal in size with greater than 50% respiratory variability, suggesting right atrial pressure of 3 mmHg. Comparison(s): No significant change from prior study. FINDINGS  Left Ventricle: Left ventricular ejection fraction, by estimation, is 60 to 65%. The left ventricle has normal function. The left ventricle has no regional wall motion abnormalities. Definity contrast agent was given IV to delineate the left ventricular  endocardial borders. The left ventricular internal cavity size was normal in  size. There is moderate left ventricular hypertrophy. Left ventricular diastolic parameters are consistent with Grade I diastolic dysfunction (impaired relaxation). Right Ventricle: The right ventricular size is not well visualized. No increase in right ventricular wall thickness. Right ventricular systolic function is low normal. Tricuspid regurgitation signal is inadequate for assessing PA pressure. Left Atrium: Left atrial size was normal in size. Right Atrium: Right atrial size was normal in size. Pericardium: There is no evidence of pericardial effusion. Mitral Valve: The mitral valve is abnormal. Moderate mitral annular calcification. Mild mitral valve regurgitation. No evidence of mitral valve stenosis. Tricuspid Valve: The tricuspid valve is not well visualized. Tricuspid valve regurgitation is not demonstrated. No evidence of tricuspid stenosis. Aortic Valve: The aortic valve is tricuspid. There is mild calcification of the aortic valve. Aortic valve regurgitation is moderate. Aortic regurgitation PHT measures 478 msec. No aortic stenosis is present. Pulmonic Valve: The pulmonic valve was not well visualized. Pulmonic valve regurgitation is not visualized. No evidence of pulmonic stenosis. Aorta: The aortic root is normal in size and structure. Venous: The inferior vena cava is normal in size with greater than 50% respiratory variability, suggesting right atrial pressure of 3 mmHg. IAS/Shunts: No atrial level shunt detected by color flow Doppler.  LEFT VENTRICLE PLAX 2D LVIDd:         4.40 cm   Diastology LVIDs:  3.00 cm   LV e' medial:    7.51 cm/s LV PW:         1.00 cm   LV E/e' medial:  10.8 LV IVS:        1.10 cm   LV e' lateral:   11.00 cm/s LVOT diam:     2.00 cm   LV E/e' lateral: 7.4 LV SV:         73 LV SV Index:   35 LVOT Area:     3.14 cm  RIGHT VENTRICLE RV S prime:     6.74 cm/s TAPSE (M-mode): 0.9 cm LEFT ATRIUM             Index        RIGHT ATRIUM           Index LA diam:         3.10 cm 1.47 cm/m   RA Area:     15.00 cm LA Vol (A2C):   48.2 ml 22.88 ml/m  RA Volume:   32.60 ml  15.47 ml/m LA Vol (A4C):   32.9 ml 15.62 ml/m LA Biplane Vol: 41.1 ml 19.51 ml/m  AORTIC VALVE LVOT Vmax:   102.00 cm/s LVOT Vmean:  72.700 cm/s LVOT VTI:    0.232 m AI PHT:      478 msec  AORTA Ao Root diam: 3.30 cm MITRAL VALVE MV Area (PHT): 2.56 cm     SHUNTS MV Decel Time: 296 msec     Systemic VTI:  0.23 m MV E velocity: 81.40 cm/s   Systemic Diam: 2.00 cm MV A velocity: 103.00 cm/s MV E/A ratio:  0.79 Zakaria Fromer Priya Jordani Nunn Electronically signed by Lorelee Cover Maleya Leever Signature Date/Time: 12/28/2022/11:46:06 AM    Final    US Venous Img Lower Bilateral (DVT)  Result Date: 12/27/2022 CLINICAL DATA:  [id number] Acute pulmonary embolism (HCC) [id number] EXAM: BILATERAL LOWER EXTREMITY VENOUS DOPPLER ULTRASOUND TECHNIQUE: Gray-scale sonography with graded compression, as well as color Doppler and duplex ultrasound were performed to evaluate the lower extremity deep venous systems from the level of the common femoral vein and including the common femoral, femoral, profunda femoral, popliteal and calf veins including the posterior tibial, peroneal and gastrocnemius veins when visible. The superficial great saphenous vein was also interrogated. Spectral Doppler was utilized to evaluate flow at rest and with distal augmentation maneuvers in the common femoral, femoral and popliteal veins. COMPARISON:  CTA PE, 12/26/2022 FINDINGS: RIGHT LOWER EXTREMITY VENOUS Normal compressibility of the RIGHT common femoral, superficial femoral, and popliteal veins, as well as the visualized calf veins. Visualized portions of profunda femoral vein and great saphenous vein unremarkable. No filling defects to suggest DVT on grayscale or color Doppler imaging. Doppler waveforms show normal direction of venous flow, normal respiratory plasticity and response to augmentation. OTHER No evidence of superficial thrombophlebitis or  abnormal fluid collection. Limitations: none LEFT LOWER EXTREMITY VENOUS Heterogeneously hypoechoic partially-occlusive filling defect within the imaged portions of the LEFT profunda femoral vein. Normal compressibility of the LEFT common femoral, superficial femoral, and popliteal veins, as well as the visualized calf veins. Visualized portions of the great saphenous vein unremarkable. OTHER No evidence of superficial thrombophlebitis or abnormal fluid collection. Limitations: none IMPRESSION: 1. Examination is POSITIVE for LEFT lower extremity DVT within the profunda femoral vein. 2. No evidence of femoropopliteal DVT within the RIGHT lower extremity. Michaelle Birks, MD [facility] Radiology Electronically Signed   By: Michaelle Birks M.D.   On:  12/27/2022 13:25   CT HEAD WO CONTRAST (5MM)  Result Date: 12/27/2022 CLINICAL DATA:  Neuro deficit with acute stroke suspected left-sided weakness and numbness for a few hours. EXAM: CT HEAD WITHOUT CONTRAST TECHNIQUE: Contiguous axial images were obtained from the base of the skull through the vertex without intravenous contrast. RADIATION DOSE REDUCTION: This exam was performed according to the departmental dose-optimization program which includes automated exposure control, adjustment of the mA and/or kV according to patient size and/or use of iterative reconstruction technique. COMPARISON:  07/30/2022 brain MRI FINDINGS: Brain: No evidence of acute infarction, hemorrhage, hydrocephalus, extra-axial collection or mass lesion/mass effect. Tiny remote right cerebellar infarct Vascular: No hyperdense vessel or unexpected calcification. Skull: Normal. Negative for fracture or focal lesion. Sinuses/Orbits: No acute finding. IMPRESSION: No acute finding. Electronically Signed   By: Jorje Guild M.D.   On: 12/27/2022 09:28   CT Angio Chest PE W and/or Wo Contrast  Result Date: 12/26/2022 CLINICAL DATA:  Pulmonary embolism  suspected, high probability. Midsternal chest pain. EXAM: CT ANGIOGRAPHY CHEST WITH CONTRAST TECHNIQUE: Multidetector CT imaging of the chest was performed using the standard protocol during bolus administration of intravenous contrast. Multiplanar CT image reconstructions and MIPs were obtained to evaluate the vascular anatomy. RADIATION DOSE REDUCTION: This exam was performed according to the departmental dose-optimization program which includes automated exposure control, adjustment of the mA and/or kV according to patient size and/or use of iterative reconstruction technique. CONTRAST:  72m OMNIPAQUE IOHEXOL 350 MG/ML SOLN COMPARISON:  12/12/2021. FINDINGS: Cardiovascular: The heart is normal in size and there is no pericardial effusion. Multi-vessel coronary artery calcifications are noted. There is atherosclerotic calcification of the aorta without evidence of aneurysm. The pulmonary trunk is normal in caliber. Small pulmonary artery filling defects are noted in the segmental and subsegmental pulmonary arteries in the right middle lobe. Examination is limited due to respiratory motion artifact. No evidence of right heart strain. Mediastinum/Nodes: No mediastinal, hilar, or axillary lymphadenopathy by size criteria. Thyroid gland, trachea, and esophagus are within normal limits. Lungs/Pleura: Multifocal scattered ground-glass opacities are noted in the right lung and at the left lung base, greater on the right than on the left. No effusion or pneumothorax. Upper Abdomen: There is fatty infiltration of the liver. The liver has a slightly lobular contour which may be associated with underlying cirrhosis. Gallbladder is surgically absent. The spleen is enlarged, unchanged from prior exams Musculoskeletal: Sternotomy wires are noted. Degenerative changes in the thoracic spine. No acute osseous abnormality. Review of the MIP images confirms the above findings. IMPRESSION: 1. Small segmental and subsegmental  pulmonary embolus in the right middle lobe. No evidence of right heart strain. 2. Scattered ground-glass opacities in the right lung and at the left lung base, likely infectious or inflammatory. 3. Hepatic steatosis. The liver has a slightly lobular contour, raising the possibility of underlying cirrhosis. 4. Splenomegaly. 5. Aortic atherosclerosis and coronary artery calcifications. Critical Value/emergent results were called by telephone at the time of interpretation on 12/26/2022 at 10:53 pm to provider JOSEPH ZAMMIT , who verbally acknowledged these results. Electronically Signed   By: LBrett FairyM.D.   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 Cardiac silhouette is unremarkable. No airspace disease, effusion, or pneumothorax. No acute bony abnormality. IMPRESSION: 1. Stable chest, no acute process. Electronically  Signed   By: Randa Ngo M.D.   On: 12/26/2022 22:01     Assessment and Plan:   1.  Noncardiac chest pain likely secondary to acute PE -EKG showed no ST-T changes, troponins were negative, LHC in 08/2022 showed patent grafts. No PCI in the last 1 year. No further cardiac testing is indicated at this time. -Chest pain symptoms will be reevaluated in the outpatient setting in cardiology clinic.  2. CAD s/p PCIs, CABG - EKG showed no ST-T changes, troponins were negative, LHC in 08/2022 showed patent grafts. No PCI in the last 1 year. No need of aspirin or any antiplatelets due to concurrent use of Eliquis. -Consider switching pravastatin to high intensity statin, rosuvastatin 20 mg nightly and Zetia 10 mg once daily. -Continue carvedilol 3.125 mg twice daily and isosorbide mononitrate 90 mg once daily.  Hold off on Ranexa.  3.  Acute pulmonary embolism(right lung) and LLE DVT  (profundofemoral vein) -Continue Eliquis per primary team. Patient has possibility of underlying hepatic cirrhosis. Child Pugh class is A and hence DOAC are okay to use.  4. Moderate AI - anticipate repeat echo 1 yr, can be arranged as OP  5. Chronic HFpEF - volume appears stable, not requiring standing diuretic  Remainder of medical issues per primary team including leukopenia, thrombocytopenia, hypokalemia, hypocalcemia, suspected cirrhosis and splenomegaly.   [facility] will sign off.   Medication Recommendations: No need of aspirin or antiplatelets, continue carvedilol 3.125 mg twice daily, Imdur 90 mg once daily, switch pravastatin to rosuvastatin 20 mg nightly, continue Zetia 10 mg once daily, Farxiga 10 mg once daily. Other recommendations (labs, testing, etc): Outpatient echo is canceled as he had one while inpatient. Follow up as an outpatient: Follow-up with outpatient cardiology, has appointment on 02/18/2023  I have spent a total of 55 minutes with patient reviewing chart , telemetry, EKGs, labs and examining patient as well as establishing an assessment and plan that was discussed with the patient.  > 50% of time was spent in direct patient care.    Manasvi Dickard Fidel Levy, MD [place]  11:39 AM

## 2022-12-29 NOTE — Progress Notes (Signed)
Mobility Specialist Progress Note:    12/29/22 0930  Mobility  Activity Ambulated independently in room  Level of Assistance Standby assist, set-up cues, supervision of patient - no hands on  Assistive Device None  Distance Ambulated (ft) 75 ft  Activity Response Tolerated well  Mobility Referral Yes  $Mobility charge 1 Mobility   Pt was agreeable to mobility session. Tolerated well, No AD, SV. Completed 5 laps to/from bed/door. SpO2 94% RA during ambulation, remained the consistent post-mobility. Left pt EOB all needs met.   Royetta Crochet Mobility Specialist Please contact via Solicitor or  Rehab office at [phone]

## 2022-12-29 NOTE — Progress Notes (Signed)
Patient's wife administered patient's subcutaneous [name] injection from home.

## 2022-12-31 ENCOUNTER — Other Ambulatory Visit: Payer: Self-pay

## 2022-12-31 DIAGNOSIS — E1100 Type 2 diabetes mellitus with hyperosmolarity without nonketotic hyperglycemic-hyperosmolar coma (NKHHC): Secondary | ICD-10-CM

## 2022-12-31 DIAGNOSIS — E1129 Type 2 diabetes mellitus with other diabetic kidney complication: Secondary | ICD-10-CM

## 2022-12-31 MED ORDER — OMEPRAZOLE 40 MG PO CPDR: 40.0000 mg | DELAYED_RELEASE_CAPSULE | Freq: Every day | ORAL | 3 refills | Status: DC

## 2022-12-31 MED ORDER — GABAPENTIN 300 MG PO CAPS: 300.0000 mg | ORAL_CAPSULE | Freq: Three times a day (TID) | ORAL | 0 refills | Status: DC

## 2022-12-31 MED ORDER — NITROGLYCERIN 0.4 MG SL SUBL: 0.4000 mg | SUBLINGUAL_TABLET | SUBLINGUAL | 0 refills | Status: DC | PRN

## 2022-12-31 MED ORDER — CYCLOBENZAPRINE HCL 10 MG PO TABS: 10.0000 mg | ORAL_TABLET | Freq: Three times a day (TID) | ORAL | 0 refills | Status: DC | PRN

## 2022-12-31 MED ORDER — METFORMIN HCL 1000 MG PO TABS: 1000.0000 mg | ORAL_TABLET | Freq: Two times a day (BID) | ORAL | 11 refills | Status: DC

## 2022-12-31 MED ORDER — TIRZEPATIDE 5 MG/0.5ML ~~LOC~~ SOAJ: 5.0000 mg | SUBCUTANEOUS | 1 refills | Status: DC

## 2022-12-31 MED ORDER — FLUTICASONE PROPIONATE 50 MCG/ACT NA SUSP: 2.0000 | Freq: Two times a day (BID) | NASAL | 3 refills | Status: DC

## 2022-12-31 MED ORDER — DAPAGLIFLOZIN PROPANEDIOL 5 MG PO TABS: 5.0000 mg | ORAL_TABLET | Freq: Every day | ORAL | 2 refills | Status: DC

## 2022-12-31 MED ORDER — EZETIMIBE 10 MG PO TABS: 10.0000 mg | ORAL_TABLET | Freq: Every day | ORAL | 0 refills | Status: DC

## 2022-12-31 MED ORDER — CITALOPRAM HYDROBROMIDE 20 MG PO TABS: 20.0000 mg | ORAL_TABLET | Freq: Every day | ORAL | 0 refills | Status: DC

## 2022-12-31 MED ORDER — CARVEDILOL 3.125 MG PO TABS: 3.1250 mg | ORAL_TABLET | Freq: Two times a day (BID) | ORAL | 3 refills | Status: DC

## 2022-12-31 MED ORDER — ISOSORBIDE MONONITRATE ER 60 MG PO TB24: ORAL_TABLET | ORAL | 1 refills | Status: DC

## 2022-12-31 MED ORDER — FREESTYLE LIBRE 2 SENSOR MISC: 3 refills | Status: DC

## 2022-12-31 MED ORDER — FREESTYLE LIBRE 2 READER DEVI: 1.0000 | 0 refills | Status: DC

## 2023-01-04 ENCOUNTER — Other Ambulatory Visit: Payer: Self-pay | Admitting: Internal Medicine

## 2023-01-04 ENCOUNTER — Ambulatory Visit: Payer: 59 | Admitting: Medical

## 2023-01-04 DIAGNOSIS — E1129 Type 2 diabetes mellitus with other diabetic kidney complication: Secondary | ICD-10-CM

## 2023-01-04 DIAGNOSIS — E1100 Type 2 diabetes mellitus with hyperosmolarity without nonketotic hyperglycemic-hyperosmolar coma (NKHHC): Secondary | ICD-10-CM

## 2023-01-06 ENCOUNTER — Other Ambulatory Visit: Payer: Self-pay

## 2023-01-06 ENCOUNTER — Ambulatory Visit (INDEPENDENT_AMBULATORY_CARE_PROVIDER_SITE_OTHER): Payer: 59 | Admitting: Internal Medicine

## 2023-01-06 ENCOUNTER — Encounter: Payer: Self-pay | Admitting: Internal Medicine

## 2023-01-06 ENCOUNTER — Telehealth: Payer: Self-pay | Admitting: Internal Medicine

## 2023-01-06 VITALS — BP 120/76 | HR 79 | Ht 69.0 in | Wt 206.0 lb

## 2023-01-06 DIAGNOSIS — J4541 Moderate persistent asthma with (acute) exacerbation: Secondary | ICD-10-CM

## 2023-01-06 DIAGNOSIS — I2699 Other pulmonary embolism without acute cor pulmonale: Secondary | ICD-10-CM | POA: Diagnosis not present

## 2023-01-06 DIAGNOSIS — J45909 Unspecified asthma, uncomplicated: Secondary | ICD-10-CM | POA: Insufficient documentation

## 2023-01-06 MED ORDER — AIRSUPRA 90-80 MCG/ACT IN AERO: 2.0000 | INHALATION_SPRAY | Freq: Four times a day (QID) | RESPIRATORY_TRACT | 0 refills | Status: DC

## 2023-01-06 NOTE — Assessment & Plan Note (Addendum)
Patient with recent Covid-19 infection and PE. He is here for hospital follow up and continues to have a cough. Has mild dyspnea. He also has T2DM. He currently is on albuterol and advair.  He has tried robitussin, but cough returns.   Plan - Albuterol-Budesonide (AIRSUPRA) 90-80 MCG/ACT AERO; Inhale 2 Inhalations into the lungs every 6 (six) hours.  Dispense: 10.7 g; Refill: 0 - Recommended using Airsupra for as needed for SOB for 5 days. He will not use albuterol while using this medication. f asthma. Recently

## 2023-01-06 NOTE — Telephone Encounter (Signed)
Pt wants to know if you can please resend Albuterol-Budesonide (AIRSUPRA) 90-80 MCG/ACT AERO ? Phar states they have not received this?

## 2023-01-06 NOTE — Telephone Encounter (Signed)
Refill sent.

## 2023-01-06 NOTE — Assessment & Plan Note (Signed)
Patient presenting for follow up after diagnosis of DVT in left leg and PE. He had tested positive for Covid a few days before being diagnosed. He was having chest pain a few days before diagnosis. 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 He is now on Apixaban.  - Provoked DVT - Continue Apixaban for 3 to 6 months - Follow up with PCP in 3 months. Can discuss continued treatment at visit.

## 2023-01-06 NOTE — Progress Notes (Signed)
   HPI:[name] is a 67 y.o. male who presents for evaluation of Hospitalization Follow-up [name] follow up reports feeling tired at times, wife reports oxygen still drops at times) . For the details of today's visit, please refer to the assessment and plan.  Physical Exam: Vitals:   01/06/23 0805  BP: 120/76  Pulse: 79  SpO2: 94%  Weight: 206 lb (93.4 kg)  Height: 5' 9"$  (1.753 m)     Physical Exam Constitutional:      Appearance: He is not ill-appearing.     Comments: Intermediate cough during exam  Cardiovascular:     Rate and Rhythm: Normal rate and regular rhythm.  Pulmonary:     Effort: Pulmonary effort is normal. No respiratory distress.     Breath sounds: No wheezing or rales.      Assessment & Plan:   [name] [name] was seen today for hospitalization follow-up.  Other acute pulmonary embolism without acute cor pulmonale (HCC) Assessment & Plan: Patient presenting for follow up after diagnosis of DVT in left leg and PE. He had tested positive for Covid a few days before being diagnosed. He was having chest pain a few days before diagnosis. 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 He is now on Apixaban.  - Provoked DVT - Continue Apixaban for 3 to 6 months - Follow up with PCP in 3 months. Can discuss continued treatment at visit.    Moderate persistent asthma with exacerbation Assessment & Plan: Patient with recent Covid-19 infection and PE. He is here for hospital follow up and continues to have a cough. Has mild dyspnea. He also has T2DM. He currently is on albuterol and advair.  He has tried robitussin, but cough returns.   Plan - Albuterol-Budesonide (AIRSUPRA) 90-80 MCG/ACT AERO; Inhale 2 Inhalations into the lungs every 6 (six) hours.  Dispense: 10.7 g; Refill: 0 - Recommended using Airsupra for as needed for SOB for 5 days. He will not use albuterol while using this medication. f asthma. Recently   Orders: Colin Ina; Inhale 2 Inhalations into the lungs every 6 (six) hours.  Dispense: 10.7 g; Refill: 0      Lorene Dy, MD

## 2023-01-06 NOTE — Patient Instructions (Signed)
Thank you, [name] [name] for allowing [place] to provide your care today.     Reminders: Do not use albuterol while using the new inhaler. Try new inhaler for 5 days to treat exacerbation of your asthma. Follow up in 3 months with Dr.Dixon.     Tamsen Snider, M.D.

## 2023-01-07 ENCOUNTER — Encounter: Payer: Self-pay | Admitting: Hematology

## 2023-01-07 ENCOUNTER — Inpatient Hospital Stay: Payer: 59 | Attending: Hematology | Admitting: Hematology

## 2023-01-07 VITALS — BP 125/69 | HR 86 | Temp 97.7°F | Resp 16 | Ht 69.0 in | Wt 205.0 lb

## 2023-01-07 DIAGNOSIS — I2699 Other pulmonary embolism without acute cor pulmonale: Secondary | ICD-10-CM

## 2023-01-07 DIAGNOSIS — D696 Thrombocytopenia, unspecified: Secondary | ICD-10-CM

## 2023-01-07 DIAGNOSIS — I2692 Saddle embolus of pulmonary artery without acute cor pulmonale: Secondary | ICD-10-CM

## 2023-01-07 DIAGNOSIS — D72819 Decreased white blood cell count, unspecified: Secondary | ICD-10-CM | POA: Diagnosis not present

## 2023-01-07 DIAGNOSIS — I82412 Acute embolism and thrombosis of left femoral vein: Secondary | ICD-10-CM | POA: Insufficient documentation

## 2023-01-07 NOTE — Patient Instructions (Addendum)
[place]  Discharge Instructions  You were seen and examined today by Dr. Delton Coombes. Dr. Delton Coombes is a hematologist, meaning that he specializes in blood abnormalities. Dr. Delton Coombes discussed your past medical history, family history of cancers/blood conditions and the events that led to you being here today.  You were referred to Dr. Delton Coombes due to a deep vein thrombosis (DVT aka blood clot) in the left leg. There were also a small blood clots seen in the lung.   You have already been prescribed Eliquis to help treat the blood clot. Continue this as prescribed until instructed otherwise.      Thank you for choosing [facility] to provide your oncology and hematology care.   To afford each patient quality time with our provider, please arrive at least 15 minutes before your scheduled appointment time. You may need to reschedule your appointment if you arrive late (10 or more minutes). Arriving late affects you and other patients whose appointments are after yours.  Also, if you miss three or more appointments without notifying the office, you may be dismissed from the clinic at the provider's discretion.    Again, thank you for choosing [facility].  Our hope is that these requests will decrease the amount of time that you wait before being seen by our physicians.   If you have a lab appointment with the [facility] please come in thru the Main Entrance and check in at the main information desk.           _____________________________________________________________  Should you have questions after your visit to [facility], please contact our office at [phone] and follow the prompts.  Our office hours are 8:00 a.m. to 4:30 p.m. Monday - Thursday and 8:00 a.m. to 2:30 p.m. Friday.  Please note that voicemails left after 4:00 p.m. may not be returned until the following business day.  We are  closed weekends and all major holidays.  You do have access to a nurse 24-7, just call the main number to the clinic [phone] and do not press any options, hold on the line and a nurse will answer the phone.    For prescription refill requests, have your pharmacy contact our office and allow 72 hours.    Masks are optional in the cancer centers. If you would like for your care team to wear a mask while they are taking care of you, please let them know. You may have one support person who is at least 65 years old accompany you for your appointments.

## 2023-01-07 NOTE — Progress Notes (Signed)
[address] [place], [place] [address]   Clinic Day:  01/07/2023  Referring physician: [name], MD  Patient Care Team: [name] Abraham, MD as PCP - General (Internal Medicine) [name] Sark, MD as PCP - Cardiology (Cardiology) [name] Jack, MD as Medical Oncologist (Hematology)   ASSESSMENT & PLAN:   Assessment:  1.?  Weakly provoked DVT/PE: - Works as a Presenter, broadcasting at [employer], mostly drives around in a car and gets off the car every 2-3 hours and works 8-hour shifts. - Presented to the ER on 12/09/2022 with intermittent midsternal pain radiating to the arm.  Chest x-ray was normal. - Was diagnosed with COVID with home test positive around 12/21/2022 (symptoms fever 1 day and cough still lingering) - Presented to ER again on 12/22/2022 with same symptoms. - Third presentation to the ER on 12/26/2022 with same symptoms. - CT angio chest: Small segmental and subsegmental pulmonary embolus in the right middle lobe with no evidence of right heart strain.  Scattered groundglass opacities in the right lung and at the left lung base, likely infectious or inflammatory.  Hepatic cirrhosis and splenomegaly. - Ultrasound legs (12/27/2022): Left profundofemoral vein DVT.  No evidence of DVT in the right leg. - Chest pain completely resolved after Eliquis started. - 20 pound weight loss since on [name].  No fevers or night sweats.  2.  Social/family history: - Lives at home with his wife.  He has been on disability for 19 years and recently started working as a Presenter, broadcasting at [employer] for the last 1 year.  He is non-smoker.  Nonalcoholic. - No family history of thrombosis.  Mother had 1 miscarriage. - Paternal grandfather had cancer.   Plan:  1.?  Weakly provoked DVT/PE: - Initially it looked like thrombosis from COVID.  However patient had presentation to the ER with a similar symptom of midsternal pain radiating to the arm  even prior to the COVID infection on 12/09/2022. - Because of his sedentary job, I am classifying it as?  Weakly provoked. - He is tolerating Eliquis very well. - I have recommended testing for resolution of thrombosis with repeat left leg Doppler and CT angiogram in 3 months.  Will also check D-dimer, lupus anticoagulant, anticardiolipin antibody and antibeta 2 glycoprotein antibody, factor V Leiden and prothrombin gene mutations. - Based on this testing, will consider duration of anticoagulation.  2.  Mild leukopenia and moderate thrombocytopenia: - He has evidence of cirrhosis and splenomegaly on recent CT scan. - Possible etiology of mild leukopenia and moderate thrombocytopenia with normal hemoglobin likely from splenomegaly.  Will plan to repeat CBC at next visit.   Orders Placed This Encounter  Procedures   US Venous Img Lower Unilateral Left    Standing Status:   Future    Standing Expiration Date:   01/07/2024    Order Specific Question:   Reason for Exam (SYMPTOM  OR DIAGNOSIS REQUIRED)    Answer:   hx DVT LLE    Order Specific Question:   Preferred imaging location?    Answer:   [facility]   CT Angio Chest Pulmonary Embolism (PE) W or WO Contrast    Standing Status:   Future    Standing Expiration Date:   01/07/2024    Order Specific Question:   If indicated for the ordered procedure, I authorize the administration of contrast media per Radiology protocol    Answer:   Yes  Order Specific Question:   Does the patient have a contrast media/X-ray dye allergy?    Answer:   No    Order Specific Question:   Preferred imaging location?    Answer:   [facility]   Lupus anticoagulant panel    Standing Status:   Future    Standing Expiration Date:   01/07/2024   Cardiolipin antibodies, IgG, IgM, IgA    Standing Status:   Future    Standing Expiration Date:   01/07/2024   D-dimer, quantitative    Standing Status:   Future    Standing Expiration Date:   01/07/2024    Beta-2-glycoprotein i abs, IgG/M/A    Standing Status:   Future    Standing Expiration Date:   01/07/2024   Factor 5 leiden    Standing Status:   Future    Standing Expiration Date:   01/07/2024   CBC    Standing Status:   Future    Standing Expiration Date:   01/07/2024   Prothrombin gene mutation    Standing Status:   Future    Standing Expiration Date:   01/07/2024      [name] [name] as a scribe for [name] Jack, MD.,have documented all relevant documentation on the behalf of [name] Jack, MD,as directed by  [name] Jack, MD while in the presence of [name] Jack, MD.   I, [name] Jack MD, have reviewed the above documentation for accuracy and completeness, and I agree with the above.   [name] Jack, MD   2/29/20246:19 PM  CHIEF COMPLAINT/PURPOSE OF CONSULT:   Diagnosis: DVT  Current Therapy:  Working Up  HISTORY OF PRESENT ILLNESS:   [name] [name] is a 67 y.o. male presenting to clinic today for evaluation of DVT at the request of Dr.Phillip [name].  ED visit on 12/09/2022 for chest pain.  ED visit on 12/22/2022 for chest pain.  ED visit on 12/26/2022 for chest pain and CT angiogram during that visit showed pulmonary emboli along with Doppler showing left profundofemoral vein DVT.  He was subsequently started on Eliquis which she is tolerating well.  Pain has resolved after the Eliquis started.  Today, he states that he is doing well overall. His appetite level is at 50%. His energy level is at 60%. He admits that he took an at home Covid test the week before 02/17 ED visit and got positive results. He admits that he had a fever and cough during that week too. He denies any change in activity since Covid. He admits that the chest pain was going up his arm and neck. He denies being immobile. He denies having history of blood clot and he denies family history of blood clot. His mom previously had a miscarriage. He admits to oxygen  dropping when walking around and becoming pale in the face. He denies any problems with Eliquis.   His paternal grandfather had cancer.   He has lost 20lbs since being on mounjaro.   He denies history of drinking alcohol and smoking.  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   LIMA to LAD, free RIMA to OM2, SVG to PDA, open SVG harvest from right thigh   Stroke (HCC)    Thrombocytopenia (HCC)    TIA (transient ischemic attack)    Type 2 diabetes mellitus ([place])     Surgical History: Past Surgical History:  Procedure Laterality Date   ANTERIOR CERVICAL DECOMP/DISCECTOMY FUSION  1998   "C3-4"   CARDIAC CATHETERIZATION  "several"   CARDIAC CATHETERIZATION N/A 12/30/2015   Procedure: Left Heart Cath and Coronary Angiography;  Surgeon: [name], MD;  Location: [place] [facility];  Service: Cardiovascular;  Laterality: N/A;   CARPAL TUNNEL RELEASE Bilateral 2005   CHOLECYSTECTOMY N/A 11/28/2018   Procedure: LAPAROSCOPIC CHOLECYSTECTOMY;  Surgeon: [name], MD;  Location: [facility];  Service: General;  Laterality: N/A;   COLONOSCOPY     COLONOSCOPY WITH PROPOFOL N/A 09/27/2020   Procedure: COLONOSCOPY WITH PROPOFOL;  Surgeon: [name], MD;  Location: [facility];  Service: Gastroenterology;  Laterality: N/A;   CORONARY ANGIOPLASTY     CORONARY ANGIOPLASTY WITH STENT PLACEMENT  2002; 2003; 11/20/2014   "I have 4 stents after today" (11/20/2014)   CORONARY ARTERY BYPASS GRAFT N/A 01/09/2016   Procedure: CORONARY ARTERY BYPASS GRAFTING (CABG) X 3 UTILIZING RIGHT AND LEFT INTERNAL MAMMARY ARTERY AND ENDOSCOPICALLY HARVESTED SAPHENEOUS VEIN.;  Surgeon: Rexene Alberts, MD;  Location: [facility];  Service: Open Heart Surgery;  Laterality: N/A;   ESOPHAGOGASTRODUODENOSCOPY     ESOPHAGOGASTRODUODENOSCOPY (EGD) WITH PROPOFOL N/A 09/27/2020   Procedure: ESOPHAGOGASTRODUODENOSCOPY (EGD) WITH PROPOFOL;  Surgeon: [name], MD;  [facility];  Service: Gastroenterology;  Laterality: N/A;  10   KNEE SURGERY Left 02/2012   "scraped; open"   LEFT HEART CATH AND CORS/GRAFTS ANGIOGRAPHY N/A 08/09/2017   Procedure: LEFT HEART CATH AND CORS/GRAFTS ANGIOGRAPHY;  Surgeon: [name], MD;  Location: [place] [facility];  Service: Cardiovascular;  Laterality: N/A;   LEFT HEART CATH AND CORS/GRAFTS ANGIOGRAPHY N/A 11/22/2018   Procedure: LEFT HEART CATH AND CORS/GRAFTS ANGIOGRAPHY;  Surgeon: [name], [name], MD;  Location: [place] [facility];  Service: Cardiovascular;  Laterality: N/A;   LEFT HEART CATH AND CORS/GRAFTS ANGIOGRAPHY N/A 08/20/2022   Procedure: LEFT HEART CATH AND CORS/GRAFTS ANGIOGRAPHY;  Surgeon: Burnell Blanks, MD;  Location: [place] [facility];  Service: Cardiovascular;  Laterality: N/A;   LEFT HEART CATHETERIZATION WITH CORONARY ANGIOGRAM N/A 07/20/2012   Procedure: LEFT HEART CATHETERIZATION WITH CORONARY ANGIOGRAM;  Surgeon: [name], MD;  Location: [place] C[facility] LAB;  Service: Cardiovascular;  Laterality: N/A;   LEFT HEART CATHETERIZATION WITH CORONARY ANGIOGRAM N/A 11/20/2014   Procedure: LEFT HEART CATHETERIZATION WITH CORONARY ANGIOGRAM;  Surgeon: [name], MD;  Location: [facility]  CATH LAB;  Service: Cardiovascular;  Laterality: N/A;   LEFT HEART CATHETERIZATION WITH CORONARY ANGIOGRAM N/A 11/26/2014   Procedure: LEFT HEART CATHETERIZATION WITH CORONARY ANGIOGRAM;  Surgeon: [name], MD;  Location: [facility] CATH  LAB;  Service: Cardiovascular;  Laterality: N/A;   NEUROPLASTY / TRANSPOSITION ULNAR NERVE AT ELBOW Right ~ 2012   PERCUTANEOUS CORONARY ROTOBLATOR INTERVENTION (PCI-R)  11/20/2014   Procedure: PERCUTANEOUS CORONARY ROTOBLATOR INTERVENTION (PCI-R);  Surgeon: [name], MD;  Location: [facility] CATH LAB;  Service: Cardiovascular;;   POSTERIOR CERVICAL LAMINECTOMY Left 04/16/2022   Procedure: Laminectomy and Foraminotomy - left - C6-C7;  Surgeon: Earnie Larsson, MD;  Location: [place];  Service: Neurosurgery;  Laterality: Left;  3C   SHOULDER ARTHROSCOPY Left ~ 2011   TEE WITHOUT CARDIOVERSION N/A 01/09/2016   Procedure: TRANSESOPHAGEAL ECHOCARDIOGRAM (TEE);  Surgeon: Rexene Alberts, MD;  Location: [place];  Service: Open Heart Surgery;  Laterality: N/A;    Social History: Social History   Socioeconomic History   Marital status: Married    Spouse name: [name] [name]   Number of children: 3   Years of education: 12 th    Highest education level: Not on file  Occupational History   Occupation: Unemployed  Tobacco Use   Smoking status: Never   Smokeless tobacco: Never  Vaping Use   Vaping Use: Never used  Substance and Sexual Activity   Alcohol use: No   Drug use: No   Sexual activity: Not on file  Other Topics Concern   Not on file  Social History Narrative   Patient lives at home with wife [name] [name].   Patient works at [employer], Engineer, petroleum.   Patient has a 12 th grade education.    Patient has 3 children.       Social Determinants of Health   Financial Resource Strain: Low Risk  (11/23/2022)   Overall Financial Resource Strain (CARDIA)    Difficulty of Paying Living Expenses: Not hard at all  Food Insecurity: No Food Insecurity (01/07/2023)    Hunger Vital Sign    Worried About Running Out of Food in the Last Year: Never true    Ran Out of Food in the Last Year: Never true  Transportation Needs: No Transportation Needs (01/07/2023)   PRAPARE - Hydrologist (Medical): No    Lack of Transportation (Non-Medical): No  Physical Activity: Sufficiently Active (11/23/2022)   Exercise Vital Sign    Days of Exercise per Week: 7 days    Minutes of Exercise per Session: 30 min  Stress: No Stress Concern Present (11/23/2022)   [place]    Feeling of Stress : Not at all  Social Connections: [place] (02/25/2021)   Social Connection and Isolation Panel [NHANES]    Frequency of Communication with Friends and Family: More than three times a week    Frequency of Social Gatherings with Friends and Family: More than three times a week    Attends Religious Services: More than 4 times per year    Active Member of [employer] or Organizations: Yes    Attends Music therapist: More than 4 times per year    Marital Status: Married  Human resources officer Violence: Not At Risk (01/07/2023)   Humiliation, Afraid, Rape, and Kick questionnaire    Fear of Current or Ex-Partner: No    Emotionally Abused: No    Physically Abused: No    Sexually Abused: No    Family History: Family History  Problem Relation Age of Onset   Stroke Mother    Coronary artery disease Father 60   Asthma Sister    Multiple sclerosis Brother  Heart disease Paternal Uncle    Stroke Maternal Grandmother    Heart attack Paternal Grandmother    Cancer Paternal Grandfather    Asthma Sister    Seizures Son    Migraines Son    Autism Son    Migraines Son     Current Medications:  Current Outpatient Medications:    albuterol (VENTOLIN HFA) 108 (90 Base) MCG/ACT inhaler, Inhale 2 puffs into the lungs every 6 (six) hours as needed for wheezing or shortness of breath.,  Disp: , Rfl:    Albuterol-Budesonide (AIRSUPRA) 90-80 MCG/ACT AERO, Inhale 2 Inhalations into the lungs every 6 (six) hours., Disp: 10.7 g, Rfl: 0   apixaban (ELIQUIS) 5 MG TABS tablet, 2 po bid thru 2/25, then 1 po BID afterwards, Disp: 60 tablet, Rfl: 2   carvedilol (COREG) 3.125 MG tablet, Take 1 tablet (3.125 mg total) by mouth 2 (two) times daily., Disp: 60 tablet, Rfl: 3   citalopram (CELEXA) 20 MG tablet, Take 1 tablet (20 mg total) by mouth at bedtime., Disp: 90 tablet, Rfl: 0   Continuous Blood Gluc Receiver (FREESTYLE LIBRE 2 READER) DEVI, 1 Device by Does not apply route continuous., Disp: 1 each, Rfl: 0   Continuous Blood Gluc Sensor (FREESTYLE LIBRE 2 SENSOR) MISC, Apply one sensor every 14 days, Disp: 6 each, Rfl: 3   cyclobenzaprine (FLEXERIL) 10 MG tablet, Take 1 tablet (10 mg total) by mouth 3 (three) times daily as needed for muscle spasms., Disp: 30 tablet, Rfl: 0   dapagliflozin propanediol (FARXIGA) 5 MG TABS tablet, Take 1 tablet (5 mg total) by mouth daily before breakfast., Disp: 30 tablet, Rfl: 2   ezetimibe (ZETIA) 10 MG tablet, Take 1 tablet (10 mg total) by mouth daily., Disp: 90 tablet, Rfl: 0   fluticasone (FLONASE) 50 MCG/ACT nasal spray, Place 2 sprays into both nostrils 2 (two) times daily., Disp: 15.8 mL, Rfl: 3   Fluticasone-Salmeterol (ADVAIR) 250-50 MCG/DOSE AEPB, Inhale 1 puff into the lungs every 12 (twelve) hours., Disp: , Rfl:    gabapentin (NEURONTIN) 300 MG capsule, Take 1 capsule (300 mg total) by mouth 3 (three) times daily. Take 300 mg by mouth 3 (three) times daily., Disp: 270 capsule, Rfl: 0   isosorbide mononitrate (IMDUR) 60 MG 24 hr tablet, Take one (1) tablet by mouth ( 60 mg ) in the am and one half tablet ( 30 mg) in the pm., Disp: 90 tablet, Rfl: 1   Melatonin 10 MG TABS, Take 10 mg by mouth at bedtime., Disp: , Rfl:    metFORMIN (GLUCOPHAGE) 1000 MG tablet, Take 1 tablet (1,000 mg total) by mouth 2 (two) times daily., Disp: 60 tablet, Rfl: 11    omeprazole (PRILOSEC) 40 MG capsule, Take 1 capsule (40 mg total) by mouth daily., Disp: 30 capsule, Rfl: 3   polycarbophil (FIBERCON) 625 MG tablet, Take 625 mg by mouth daily., Disp: , Rfl:    rosuvastatin (CRESTOR) 20 MG tablet, Take 1 tablet (20 mg total) by mouth daily., Disp: 30 tablet, Rfl: 2   tirzepatide ([name]) 5 MG/0.5ML Pen, Inject 5 mg into the skin once a week., Disp: 6 mL, Rfl: 1   topiramate (TOPAMAX) 50 MG tablet, Take 50 mg by mouth 2 (two) times daily., Disp: , Rfl:    nitroGLYCERIN (NITROSTAT) 0.4 MG SL tablet, Place 1 tablet (0.4 mg total) under the tongue every 5 (five) minutes x 3 doses as needed for chest pain (if no relief after 2nd dose, proceed to the ED  for an evaluation or call 911). Place 0.4 mg under the tongue every 5 (five) minutes x 3 doses as needed for chest pain (if no relief after 2nd dose, proceed to the ED for an evaluation or call 911). (Patient not taking: Reported on 01/07/2023), Disp: 30 tablet, Rfl: 0   Allergies: Allergies  Allergen Reactions   Divalproex Sodium Other (See Comments)    Causes anger   Statins Other (See Comments)    Muscle aches and cramps   Tramadol Other (See Comments)    Chest pain    Valproic Acid Other (See Comments)    Causes anger   Gadolinium Derivatives Nausea And Vomiting    07/25/19 Pt vomited immediately after IV gad. Denies itching, dyspnea.  (Adverse, not allergic, reaction   Tricor [Fenofibrate] Other (See Comments)    Leg cramps    REVIEW OF SYSTEMS:   Review of Systems  Constitutional:  Negative for chills, fatigue and fever.  HENT:   Negative for lump/mass, mouth sores, nosebleeds, sore throat and trouble swallowing.   Eyes:  Negative for eye problems.  Respiratory:  Positive for cough. Negative for shortness of breath.   Cardiovascular:  Negative for chest pain, leg swelling and palpitations.  Gastrointestinal:  Positive for diarrhea. Negative for abdominal pain, constipation, nausea and vomiting.   Genitourinary:  Negative for bladder incontinence, difficulty urinating, dysuria, frequency, hematuria and nocturia.   Musculoskeletal:  Negative for arthralgias, back pain, flank pain, myalgias and neck pain.  Skin:  Negative for itching and rash.  Neurological:  Positive for dizziness, headaches and numbness (Legs). Negative for light-headedness.  Hematological:  Does not bruise/bleed easily.  Psychiatric/Behavioral:  Negative for depression, sleep disturbance and suicidal ideas. The patient is not nervous/anxious.   All other systems reviewed and are negative.    VITALS:   Blood pressure 125/69, pulse 86, temperature 97.7 F (36.5 C), temperature source Oral, resp. rate 16, height '5\' 9"'$  (1.753 m), weight 205 lb (93 kg), SpO2 97 %.  Wt Readings from Last 3 Encounters:  01/07/23 205 lb (93 kg)  01/06/23 206 lb (93.4 kg)  12/27/22 209 lb 7 oz (95 kg)    Body mass index is 30.27 kg/m.   PHYSICAL EXAM:   Physical Exam Vitals and nursing note reviewed. Exam conducted with a chaperone present.  Constitutional:      Appearance: Normal appearance.  Cardiovascular:     Rate and Rhythm: Normal rate and regular rhythm.     Pulses: Normal pulses.     Heart sounds: Normal heart sounds.  Pulmonary:     Effort: Pulmonary effort is normal.     Breath sounds: Normal breath sounds.  Abdominal:     Palpations: Abdomen is soft. There is no hepatomegaly, splenomegaly or mass.     Tenderness: There is no abdominal tenderness.  Musculoskeletal:     Right lower leg: No edema.     Left lower leg: No edema.  Lymphadenopathy:     Cervical: No cervical adenopathy.     Right cervical: No superficial, deep or posterior cervical adenopathy.    Left cervical: No superficial, deep or posterior cervical adenopathy.     Upper Body:     Right upper body: No supraclavicular or axillary adenopathy.     Left upper body: No supraclavicular or axillary adenopathy.  Neurological:     General: No focal  deficit present.     Mental Status: He is alert and oriented to person, place, and time.  Psychiatric:  Mood and Affect: Mood normal.        Behavior: Behavior normal.     LABS:      Latest Ref Rng & Units 12/29/2022    3:59 AM 12/28/2022    4:32 AM 12/27/2022    6:49 AM  CBC  WBC 4.0 - 10.5 K/uL 3.4  2.7  3.7   Hemoglobin 13.0 - 17.0 g/dL 14.7  14.0  14.2   Hematocrit 39.0 - 52.0 % 42.2  39.5  40.1   Platelets 150 - 400 K/uL 91  76  77       Latest Ref Rng & Units 12/28/2022    7:48 AM 12/27/2022    6:49 AM 12/26/2022    9:28 PM  CMP  Glucose 70 - 99 mg/dL 145  110  129   BUN 8 - 23 mg/dL '15  16  15   '$ Creatinine 0.61 - 1.24 mg/dL 0.96  0.91  1.02   Sodium 135 - 145 mmol/L 136  135  135   Potassium 3.5 - 5.1 mmol/L 3.6  3.2  3.4   Chloride 98 - 111 mmol/L 106  108  107   CO2 22 - 32 mmol/L '20  20  22   '$ Calcium 8.9 - 10.3 mg/dL 8.6  8.8  9.0   Total Protein 6.5 - 8.1 g/dL  7.0  7.3   Total Bilirubin 0.3 - 1.2 mg/dL  1.0  1.1   Alkaline Phos 38 - 126 U/L  47  49   AST 15 - 41 U/L  18  19   ALT 0 - 44 U/L  21  24      No results found for: "CEA1", "CEA" / No results found for: "CEA1", "CEA" No results found for: "PSA1" No results found for: "CAN199" No results found for: "CAN125"  Lab Results  Component Value Date   TOTALPROTELP 7.8 12/26/2018   ALBUMINELP 3.1 12/26/2018   A1GS 0.3 12/26/2018   A2GS 1.0 12/26/2018   BETS 1.6 (H) 12/26/2018   GAMS 1.8 12/26/2018   MSPIKE Not Observed 12/26/2018   SPEI Comment 12/26/2018   Lab Results  Component Value Date   TIBC 388 08/20/2021   TIBC 402 02/18/2021   TIBC 374 05/31/2020   FERRITIN 27 08/20/2021   FERRITIN 36 02/18/2021   FERRITIN 100 05/31/2020   IRONPCTSAT 29 08/20/2021   IRONPCTSAT 25 02/18/2021   IRONPCTSAT 27 05/31/2020   Lab Results  Component Value Date   LDH 157 02/18/2021   LDH 138 08/30/2019     STUDIES:   ECHOCARDIOGRAM COMPLETE  Result Date: 12/28/2022    ECHOCARDIOGRAM REPORT    Patient Name:   NICKSON DIMARE Date of Exam: 12/27/2022 Medical Rec #:  [id number]       Height:       69.0 in Accession #:    [id number]      Weight:       209.4 lb Date of Birth:  1956-09-27       BSA:          2.107 m Patient Age:    67 years        BP:           140/78 mmHg Patient Gender: M               HR:           70 bpm. Exam Location:  [facility] Procedure: 2D Echo, Cardiac Doppler and Color Doppler Indications:  Pulmonary Embolus I26.09  History:        Patient has prior history of Echocardiogram examinations, most                 recent 07/28/2021. CHF, CAD, Prior CABG, TIA, Arrythmias:PVC;                 Risk Factors:Hypertension, Diabetes and Dyslipidemia.  Sonographer:    Alvino Chapel RCS Referring Phys: [id number] ASIA B [place]  Sonographer Comments: Technically Difficult Study esp. the apical windows. IMPRESSIONS  1. Left ventricular ejection fraction, by estimation, is 60 to 65%. The left ventricle has normal function. The left ventricle has no regional wall motion abnormalities. There is moderate left ventricular hypertrophy. Left ventricular diastolic parameters are consistent with Grade I diastolic dysfunction (impaired relaxation).  2. Right ventricular systolic function is low normal. The right ventricular size is not well visualized. Tricuspid regurgitation signal is inadequate for assessing PA pressure.  3. The mitral valve is abnormal. Mild mitral valve regurgitation. No evidence of mitral stenosis. Moderate mitral annular calcification.  4. The aortic valve is tricuspid. There is mild calcification of the aortic valve. Aortic valve regurgitation is moderate. No aortic stenosis is present.  5. The inferior vena cava is normal in size with greater than 50% respiratory variability, suggesting right atrial pressure of 3 mmHg. Comparison(s): No significant change from prior study. FINDINGS  Left Ventricle: Left ventricular ejection fraction, by estimation, is 60 to 65%. The left ventricle  has normal function. The left ventricle has no regional wall motion abnormalities. Definity contrast agent was given IV to delineate the left ventricular  endocardial borders. The left ventricular internal cavity size was normal in size. There is moderate left ventricular hypertrophy. Left ventricular diastolic parameters are consistent with Grade I diastolic dysfunction (impaired relaxation). Right Ventricle: The right ventricular size is not well visualized. No increase in right ventricular wall thickness. Right ventricular systolic function is low normal. Tricuspid regurgitation signal is inadequate for assessing PA pressure. Left Atrium: Left atrial size was normal in size. Right Atrium: Right atrial size was normal in size. Pericardium: There is no evidence of pericardial effusion. Mitral Valve: The mitral valve is abnormal. Moderate mitral annular calcification. Mild mitral valve regurgitation. No evidence of mitral valve stenosis. Tricuspid Valve: The tricuspid valve is not well visualized. Tricuspid valve regurgitation is not demonstrated. No evidence of tricuspid stenosis. Aortic Valve: The aortic valve is tricuspid. There is mild calcification of the aortic valve. Aortic valve regurgitation is moderate. Aortic regurgitation PHT measures 478 msec. No aortic stenosis is present. Pulmonic Valve: The pulmonic valve was not well visualized. Pulmonic valve regurgitation is not visualized. No evidence of pulmonic stenosis. Aorta: The aortic root is normal in size and structure. Venous: The inferior vena cava is normal in size with greater than 50% respiratory variability, suggesting right atrial pressure of 3 mmHg. IAS/Shunts: No atrial level shunt detected by color flow Doppler.  LEFT VENTRICLE PLAX 2D LVIDd:         4.40 cm   Diastology LVIDs:         3.00 cm   LV e' medial:    7.51 cm/s LV PW:         1.00 cm   LV E/e' medial:  10.8 LV IVS:        1.10 cm   LV e' lateral:   11.00 cm/s LVOT diam:     2.00 cm    LV E/e' lateral: 7.4 LV SV:  73 LV SV Index:   35 LVOT Area:     3.14 cm  RIGHT VENTRICLE RV S prime:     6.74 cm/s TAPSE (M-mode): 0.9 cm LEFT ATRIUM             Index        RIGHT ATRIUM           Index LA diam:        3.10 cm 1.47 cm/m   RA Area:     15.00 cm LA Vol (A2C):   48.2 ml 22.88 ml/m  RA Volume:   32.60 ml  15.47 ml/m LA Vol (A4C):   32.9 ml 15.62 ml/m LA Biplane Vol: 41.1 ml 19.51 ml/m  AORTIC VALVE LVOT Vmax:   102.00 cm/s LVOT Vmean:  72.700 cm/s LVOT VTI:    0.232 m AI PHT:      478 msec  AORTA Ao Root diam: 3.30 cm MITRAL VALVE MV Area (PHT): 2.56 cm     SHUNTS MV Decel Time: 296 msec     Systemic VTI:  0.23 m MV E velocity: 81.40 cm/s   Systemic Diam: 2.00 cm MV A velocity: 103.00 cm/s MV E/A ratio:  0.79 Vishnu Priya Mallipeddi Electronically signed by Lorelee Cover Mallipeddi Signature Date/Time: 12/28/2022/11:46:06 AM    Final    US Venous Img Lower Bilateral (DVT)  Result Date: 12/27/2022 CLINICAL DATA:  [id number] Acute pulmonary embolism ([place]) [id number] EXAM: BILATERAL LOWER EXTREMITY VENOUS DOPPLER ULTRASOUND TECHNIQUE: Gray-scale sonography with graded compression, as well as color Doppler and duplex ultrasound were performed to evaluate the lower extremity deep venous systems from the level of the common femoral vein and including the common femoral, femoral, profunda femoral, popliteal and calf veins including the posterior tibial, peroneal and gastrocnemius veins when visible. The superficial great saphenous vein was also interrogated. Spectral Doppler was utilized to evaluate flow at rest and with distal augmentation maneuvers in the common femoral, femoral and popliteal veins. COMPARISON:  CTA PE, 12/26/2022 FINDINGS: RIGHT LOWER EXTREMITY VENOUS Normal compressibility of the RIGHT common femoral, superficial femoral, and popliteal veins, as well as the visualized calf veins. Visualized portions of profunda femoral vein and great saphenous vein unremarkable. No filling  defects to suggest DVT on grayscale or color Doppler imaging. Doppler waveforms show normal direction of venous flow, normal respiratory plasticity and response to augmentation. OTHER No evidence of superficial thrombophlebitis or abnormal fluid collection. Limitations: none LEFT LOWER EXTREMITY VENOUS Heterogeneously hypoechoic partially-occlusive filling defect within the imaged portions of the LEFT profunda femoral vein. Normal compressibility of the LEFT common femoral, superficial femoral, and popliteal veins, as well as the visualized calf veins. Visualized portions of the great saphenous vein unremarkable. OTHER No evidence of superficial thrombophlebitis or abnormal fluid collection. Limitations: none IMPRESSION: 1. Examination is POSITIVE for LEFT lower extremity DVT within the profunda femoral vein. 2. No evidence of femoropopliteal DVT within the RIGHT lower extremity. Michaelle Birks, MD [facility] Electronically Signed   By: Michaelle Birks M.D.   On: 12/27/2022 13:25   CT HEAD WO CONTRAST (5MM)  Result Date: 12/27/2022 CLINICAL DATA:  Neuro deficit with acute stroke suspected left-sided weakness and numbness for a few hours. EXAM: CT HEAD WITHOUT CONTRAST TECHNIQUE: Contiguous axial images were obtained from the base of the skull through the vertex without intravenous contrast. RADIATION DOSE REDUCTION: This exam was performed according to the departmental dose-optimization program which includes automated exposure control, adjustment of the mA and/or  kV according to patient size and/or use of iterative reconstruction technique. COMPARISON:  07/30/2022 brain MRI FINDINGS: Brain: No evidence of acute infarction, hemorrhage, hydrocephalus, extra-axial collection or mass lesion/mass effect. Tiny remote right cerebellar infarct Vascular: No hyperdense vessel or unexpected calcification. Skull: Normal. Negative for fracture or focal lesion.  Sinuses/Orbits: No acute finding. IMPRESSION: No acute finding. Electronically Signed   By: Jorje Guild M.D.   On: 12/27/2022 09:28   CT Angio Chest PE W and/or Wo Contrast  Result Date: 12/26/2022 CLINICAL DATA:  Pulmonary embolism suspected, high probability. Midsternal chest pain. EXAM: CT ANGIOGRAPHY CHEST WITH CONTRAST TECHNIQUE: Multidetector CT imaging of the chest was performed using the standard protocol during bolus administration of intravenous contrast. Multiplanar CT image reconstructions and MIPs were obtained to evaluate the vascular anatomy. RADIATION DOSE REDUCTION: This exam was performed according to the departmental dose-optimization program which includes automated exposure control, adjustment of the mA and/or kV according to patient size and/or use of iterative reconstruction technique. CONTRAST:  65m OMNIPAQUE IOHEXOL 350 MG/ML SOLN COMPARISON:  12/12/2021. FINDINGS: Cardiovascular: The heart is normal in size and there is no pericardial effusion. Multi-vessel coronary artery calcifications are noted. There is atherosclerotic calcification of the aorta without evidence of aneurysm. The pulmonary trunk is normal in caliber. Small pulmonary artery filling defects are noted in the segmental and subsegmental pulmonary arteries in the right middle lobe. Examination is limited due to respiratory motion artifact. No evidence of right heart strain. Mediastinum/Nodes: No mediastinal, hilar, or axillary lymphadenopathy by size criteria. Thyroid gland, trachea, and esophagus are within normal limits. Lungs/Pleura: Multifocal scattered ground-glass opacities are noted in the right lung and at the left lung base, greater on the right than on the left. No effusion or pneumothorax. Upper Abdomen: There is fatty infiltration of the liver. The liver has a slightly lobular contour which may be associated with underlying cirrhosis. Gallbladder is surgically absent. The spleen is enlarged, unchanged from  prior exams Musculoskeletal: Sternotomy wires are noted. Degenerative changes in the thoracic spine. No acute osseous abnormality. Review of the MIP images confirms the above findings. IMPRESSION: 1. Small segmental and subsegmental pulmonary embolus in the right middle lobe. No evidence of right heart strain. 2. Scattered ground-glass opacities in the right lung and at the left lung base, likely infectious or inflammatory. 3. Hepatic steatosis. The liver has a slightly lobular contour, raising the possibility of underlying cirrhosis. 4. Splenomegaly. 5. Aortic atherosclerosis and coronary artery calcifications. Critical Value/emergent results were called by telephone at the time of interpretation on 12/26/2022 at 10:53 pm to provider JOSEPH ZAMMIT , who verbally acknowledged these results. Electronically Signed   By: LBrett FairyM.D.   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 Cardiac silhouette is unremarkable. No airspace disease, effusion, or pneumothorax. No acute bony abnormality. IMPRESSION: 1. Stable chest, no acute process. Electronically Signed   By: MRanda NgoM.D.   On: 12/26/2022 22:01   DG Chest 2 View  Result Date: 12/22/2022 CLINICAL DATA:  Chest pain EXAM: CHEST - 2 VIEW COMPARISON:  12/09/2022 FINDINGS: Lungs are clear. No pneumothorax or pleural effusion. Coronary artery bypass grafting has been performed. Cardiac size within normal limits. Osseous structures are age-appropriate. IMPRESSION: 1. No active cardiopulmonary disease. Electronically Signed   By: ALinwood DibblesD.  On: 12/22/2022 20:52   DG Chest 2 View  Result Date: 12/09/2022 CLINICAL DATA:  Chest pain with intermittent chest tightness and shortness of breath for several days. EXAM: CHEST - 2 VIEW COMPARISON:   Radiographs 08/14/2022 and 09/03/2021.  CT 12/12/2021. FINDINGS: The heart size and mediastinal contours appear stable status post median sternotomy and CABG. The lungs are clear. There is no pleural effusion or pneumothorax. No acute osseous findings are evident. Degenerative changes are present in the spine. IMPRESSION: Stable chest. No acute cardiopulmonary process. Previous CABG. Electronically Signed   By: Richardean Sale M.D.   On: 12/09/2022 13:28

## 2023-01-11 ENCOUNTER — Encounter: Payer: Self-pay | Admitting: Pulmonary Disease

## 2023-01-11 ENCOUNTER — Ambulatory Visit (INDEPENDENT_AMBULATORY_CARE_PROVIDER_SITE_OTHER): Payer: 59 | Admitting: Pulmonary Disease

## 2023-01-11 VITALS — BP 132/74 | HR 78 | Ht 69.0 in | Wt 204.4 lb

## 2023-01-11 DIAGNOSIS — R0683 Snoring: Secondary | ICD-10-CM | POA: Diagnosis not present

## 2023-01-11 DIAGNOSIS — I2699 Other pulmonary embolism without acute cor pulmonale: Secondary | ICD-10-CM | POA: Diagnosis not present

## 2023-01-11 DIAGNOSIS — G4733 Obstructive sleep apnea (adult) (pediatric): Secondary | ICD-10-CM

## 2023-01-11 NOTE — Assessment & Plan Note (Signed)
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  Would definitely advise anticoagulation for 6 months, pending further hematology workup

## 2023-01-11 NOTE — Progress Notes (Signed)
Subjective:    Patient ID: [name] [name], male    DOB: 09/22/1956, 68 y.o.   MRN: [id number]  HPI  Chief Complaint  Patient presents with   Consult    Sleep consult.  Prev had CPAP but it has been years unsure of DME     67 year old never smoker presents for evaluation of OSA.[name] He also has a recent diagnosis of pulm embolism.  He was hospitalized 2/17 for shortness of breath, tested positive for COVID 2/12, was found to have right middle lobe small segmental and subsegmental emboli with DVT left lower extremity.  He was discharged on Eliquis.  Has seen hematology, noted to have mild leukopenia and thrombocytopenia and workup is in progress. He works as a Company secretary at [employer] and admits to sedentary lifestyle  OSA was diagnosed in 2020 he was initially placed on CPAP.  After subsequent titration study was told he would need BiPAP however he never received this.  His CPAP was a Philips machine and at the time of the recall he stopped using the machine and never received a replacement. He is now willing to use a different machine. He has lost 34 pounds since.  No sleep study, now on [name].  Epworth sleepiness score is 6 Bedtime is around midnight, sleep latency minimal, he sleeps on his side with 1 pillows, reports 2 nocturnal awakenings, out of bed by 8 AM feeling tired with dryness of mouth There is no history suggestive of cataplexy, sleep paralysis or parasomnias    Significant tests/ events reviewed  08/2019 NPSG ([facility] ) AHI 44/hour lowest desaturation 80%. 09/2019 events not controlled with CPAP titration, required BiPAP 20/15 and that is related to 8/hour - wt 238 lbs  Past Medical History:  Diagnosis Date   Anxiety    Aortic insufficiency    Arthritis    Asthma    Cataract    Right eye   Chronic lower back pain    Cirrhosis of liver (HCC)    Coronary atherosclerosis of native coronary artery    a. 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   LIMA to LAD, free RIMA to OM2, SVG to PDA, open SVG harvest from right thigh   Stroke ([place])    Thrombocytopenia (HCC)    TIA (transient ischemic attack)    Type 2 diabetes mellitus ([place])      Past Surgical History:  Procedure Laterality Date   ANTERIOR CERVICAL DECOMP/DISCECTOMY FUSION  1998   "C3-4"   CARDIAC CATHETERIZATION  "several"   CARDIAC CATHETERIZATION N/A 12/30/2015   Procedure: Left Heart Cath and Coronary Angiography;  Surgeon: [name], MD;  Location: [place];  Service: Cardiovascular;  Laterality: N/A;   CARPAL TUNNEL RELEASE Bilateral 2005   CHOLECYSTECTOMY N/A 11/28/2018   Procedure: LAPAROSCOPIC CHOLECYSTECTOMY;  Surgeon: [name], MD;  Location: [facility];  Service: General;  Laterality: N/A;   COLONOSCOPY     COLONOSCOPY WITH PROPOFOL N/A 09/27/2020  Procedure: COLONOSCOPY WITH PROPOFOL;  Surgeon: [name], MD;  Location: [facility];  Service: Gastroenterology;  Laterality: N/A;   CORONARY ANGIOPLASTY     CORONARY ANGIOPLASTY WITH STENT PLACEMENT  2002; 2003; 11/20/2014   "I have 4 stents after today" (11/20/2014)   CORONARY ARTERY BYPASS GRAFT N/A 01/09/2016   Procedure: CORONARY ARTERY BYPASS GRAFTING (CABG) X 3 UTILIZING RIGHT AND LEFT INTERNAL MAMMARY ARTERY AND ENDOSCOPICALLY HARVESTED SAPHENEOUS VEIN.;  Surgeon: Rexene Alberts, MD;  Location: [facility];  Service: Open Heart Surgery;  Laterality: N/A;   ESOPHAGOGASTRODUODENOSCOPY     ESOPHAGOGASTRODUODENOSCOPY (EGD) WITH PROPOFOL N/A 09/27/2020   Procedure: ESOPHAGOGASTRODUODENOSCOPY (EGD) WITH  PROPOFOL;  Surgeon: [name], MD;  [facility];  Service: Gastroenterology;  Laterality: N/A;  10   KNEE SURGERY Left 02/2012   "scraped; open"   LEFT HEART CATH AND CORS/GRAFTS ANGIOGRAPHY N/A 08/09/2017   Procedure: LEFT HEART CATH AND CORS/GRAFTS ANGIOGRAPHY;  Surgeon: [name], MD;  Location: [facility];  Service: Cardiovascular;  Laterality: N/A;   LEFT HEART CATH AND CORS/GRAFTS ANGIOGRAPHY N/A 11/22/2018   Procedure: LEFT HEART CATH AND CORS/GRAFTS ANGIOGRAPHY;  Surgeon: [name], [name], MD;  Location: [facility];  Service: Cardiovascular;  Laterality: N/A;   LEFT HEART CATH AND CORS/GRAFTS ANGIOGRAPHY N/A 08/20/2022   Procedure: LEFT HEART CATH AND CORS/GRAFTS ANGIOGRAPHY;  Surgeon: Burnell Blanks, MD;  Location: [facility];  Service: Cardiovascular;  Laterality: N/A;   LEFT HEART CATHETERIZATION WITH CORONARY ANGIOGRAM N/A 07/20/2012   Procedure: LEFT HEART CATHETERIZATION WITH CORONARY ANGIOGRAM;  Surgeon: [name], MD;  Location: [facility];  Service: Cardiovascular;  Laterality: N/A;   LEFT HEART CATHETERIZATION WITH CORONARY ANGIOGRAM N/A 11/20/2014   Procedure: LEFT HEART CATHETERIZATION WITH CORONARY ANGIOGRAM;  Surgeon: [name], MD;  Location: [facility];  Service: Cardiovascular;  Laterality: N/A;   LEFT HEART CATHETERIZATION WITH CORONARY ANGIOGRAM N/A 11/26/2014   Procedure: LEFT HEART CATHETERIZATION WITH CORONARY ANGIOGRAM;  Surgeon: [name], MD;  Location: [facility] LAB;  Service: Cardiovascular;  Laterality: N/A;   NEUROPLASTY / TRANSPOSITION ULNAR NERVE AT ELBOW Right ~ 2012   PERCUTANEOUS CORONARY ROTOBLATOR INTERVENTION (PCI-R)  11/20/2014   Procedure: PERCUTANEOUS CORONARY ROTOBLATOR INTERVENTION (PCI-R);  Surgeon: [name], MD;  Location: [facility] C[facility];  Service: Cardiovascular;;   POSTERIOR CERVICAL LAMINECTOMY Left 04/16/2022   Procedure: Laminectomy and Foraminotomy - left - C6-C7;   Surgeon: Earnie Larsson, MD;  Location: [facility];  Service: Neurosurgery;  Laterality: Left;  3C   SHOULDER ARTHROSCOPY Left ~ 2011   TEE WITHOUT CARDIOVERSION N/A 01/09/2016   Procedure: TRANSESOPHAGEAL ECHOCARDIOGRAM (TEE);  Surgeon: Rexene Alberts, MD;  Location: [facility];  Service: Open Heart Surgery;  Laterality: N/A;    Allergies  Allergen Reactions   Divalproex Sodium Other (See Comments)    Causes anger   Statins Other (See Comments)    Muscle aches and cramps   Tramadol Other (See Comments)    Chest pain    Valproic Acid Other (See Comments)    Causes anger   Gadolinium Derivatives Nausea And Vomiting    07/25/19 Pt vomited immediately after IV gad. Denies itching, dyspnea.  (Adverse, not allergic, reaction   Tricor [Fenofibrate] Other (See Comments)    Leg cramps    Social History   Socioeconomic History   Marital status: Married    Spouse name: [name]   Number of children: 3  Years of education: 32 th    Highest education level: Not on file  Occupational History   Occupation: Unemployed  Tobacco Use   Smoking status: Never   Smokeless tobacco: Never  Vaping Use   Vaping Use: Never used  Substance and Sexual Activity   Alcohol use: No   Drug use: No   Sexual activity: Not on file  Other Topics Concern   Not on file  Social History Narrative   Patient lives at home with wife [name].   Patient works at [employer], Engineer, petroleum.   Patient has a 12 th grade education.    Patient has 3 children.       Social Determinants of Health   Financial Resource Strain: Low Risk  (11/23/2022)   Overall Financial Resource Strain (CARDIA)    Difficulty of Paying Living Expenses: Not hard at all  Food Insecurity: No Food Insecurity (01/07/2023)   Hunger Vital Sign    Worried About Running Out of Food in the Last Year: Never true    Ran Out of Food in the Last Year: Never true  Transportation Needs: No Transportation Needs (01/07/2023)   PRAPARE - Armed forces logistics/support/administrative officer (Medical): No    Lack of Transportation (Non-Medical): No  Physical Activity: Sufficiently Active (11/23/2022)   Exercise Vital Sign    Days of Exercise per Week: 7 days    Minutes of Exercise per Session: 30 min  Stress: No Stress Concern Present (11/23/2022)   [place]    Feeling of Stress : Not at all  Social Connections: [place] (02/25/2021)   Social Connection and Isolation Panel [NHANES]    Frequency of Communication with Friends and Family: More than three times a week    Frequency of Social Gatherings with Friends and Family: More than three times a week    Attends Religious Services: More than 4 times per year    Active Member of [employer] or Organizations: Yes    Attends Music therapist: More than 4 times per year    Marital Status: Married  Human resources officer Violence: Not At Risk (01/07/2023)   Humiliation, Afraid, Rape, and Kick questionnaire    Fear of Current or Ex-Partner: No    Emotionally Abused: No    Physically Abused: No    Sexually Abused: No    Family History  Problem Relation Age of Onset   Stroke Mother    Coronary artery disease Father 47   Asthma Sister    Multiple sclerosis Brother    Heart disease Paternal Uncle    Stroke Maternal Grandmother    Heart attack Paternal Grandmother    Cancer Paternal Grandfather    Asthma Sister    Seizures Son    Migraines Son    Autism Son    Migraines Son      Review of Systems  Constitutional: negative for anorexia, fevers and sweats  Eyes: negative for irritation, redness and visual disturbance  Ears, nose, mouth, throat, and face: negative for earaches, epistaxis, nasal congestion and sore throat  Respiratory: negative for cough, dyspnea on exertion, sputum and wheezing  Cardiovascular: negative for chest pain, dyspnea, lower extremity edema, orthopnea, palpitations and syncope   Gastrointestinal: negative for abdominal pain, constipation, diarrhea, melena, nausea and vomiting  Genitourinary:negative for dysuria, frequency and hematuria  Hematologic/lymphatic: negative for bleeding, easy bruising and lymphadenopathy  Musculoskeletal:negative for arthralgias, muscle weakness and stiff joints  Neurological: negative for coordination problems, gait problems, headaches and weakness  Endocrine: negative for diabetic symptoms including polydipsia, polyuria and weight loss     Objective:   Physical Exam  Gen. Pleasant, obese, in no distress ENT - no lesions, no post nasal drip Neck: No JVD, no thyromegaly, no carotid bruits Lungs: no use of accessory muscles, no dullness to percussion, decreased without rales or rhonchi  Cardiovascular: Rhythm regular, heart sounds  normal, no murmurs or gallops, no peripheral edema Musculoskeletal: No deformities, no cyanosis or clubbing , no tremors       Assessment & Plan:

## 2023-01-11 NOTE — Assessment & Plan Note (Signed)
He is willing to use PAP now. He is lost significant weight and it is possible that still has OSA based on history but may need to lower pressure on his machine. Will reassess with a home sleep test.  Following this he will need repeat titration study depending on the results. Weight loss encouraged, compliance with goal of at least 4-6 hrs every night is the expectation. Advised against medications with sedative side effects Cautioned against driving when sleepy - understanding that sleepiness will vary on a day to day basis

## 2023-01-11 NOTE — Patient Instructions (Signed)
  X Home sleep test  Based ont his, we will get you a new machine

## 2023-01-17 ENCOUNTER — Emergency Department (HOSPITAL_COMMUNITY): Payer: 59

## 2023-01-17 ENCOUNTER — Other Ambulatory Visit: Payer: Self-pay

## 2023-01-17 ENCOUNTER — Encounter (HOSPITAL_COMMUNITY): Payer: Self-pay

## 2023-01-17 ENCOUNTER — Emergency Department (HOSPITAL_COMMUNITY)
Admission: EM | Admit: 2023-01-17 | Discharge: 2023-01-17 | Disposition: A | Discharge status: Home / Self Care | Payer: 59 | Attending: Emergency Medicine | Admitting: Emergency Medicine

## 2023-01-17 DIAGNOSIS — I110 Hypertensive heart disease with heart failure: Secondary | ICD-10-CM | POA: Insufficient documentation

## 2023-01-17 DIAGNOSIS — Y9389 Activity, other specified: Secondary | ICD-10-CM | POA: Insufficient documentation

## 2023-01-17 DIAGNOSIS — Z7984 Long term (current) use of oral hypoglycemic drugs: Secondary | ICD-10-CM | POA: Diagnosis not present

## 2023-01-17 DIAGNOSIS — M25522 Pain in left elbow: Secondary | ICD-10-CM | POA: Diagnosis not present

## 2023-01-17 DIAGNOSIS — I509 Heart failure, unspecified: Secondary | ICD-10-CM | POA: Insufficient documentation

## 2023-01-17 DIAGNOSIS — W01198A Fall on same level from slipping, tripping and stumbling with subsequent striking against other object, initial encounter: Secondary | ICD-10-CM | POA: Insufficient documentation

## 2023-01-17 DIAGNOSIS — E119 Type 2 diabetes mellitus without complications: Secondary | ICD-10-CM | POA: Diagnosis not present

## 2023-01-17 DIAGNOSIS — W19XXXA Unspecified fall, initial encounter: Secondary | ICD-10-CM

## 2023-01-17 DIAGNOSIS — R079 Chest pain, unspecified: Secondary | ICD-10-CM | POA: Diagnosis not present

## 2023-01-17 DIAGNOSIS — Z79899 Other long term (current) drug therapy: Secondary | ICD-10-CM | POA: Diagnosis not present

## 2023-01-17 DIAGNOSIS — S0990XA Unspecified injury of head, initial encounter: Secondary | ICD-10-CM

## 2023-01-17 DIAGNOSIS — M25562 Pain in left knee: Secondary | ICD-10-CM | POA: Insufficient documentation

## 2023-01-17 DIAGNOSIS — I2510 Atherosclerotic heart disease of native coronary artery without angina pectoris: Secondary | ICD-10-CM | POA: Insufficient documentation

## 2023-01-17 DIAGNOSIS — Z794 Long term (current) use of insulin: Secondary | ICD-10-CM | POA: Diagnosis not present

## 2023-01-17 DIAGNOSIS — Z7901 Long term (current) use of anticoagulants: Secondary | ICD-10-CM | POA: Diagnosis not present

## 2023-01-17 DIAGNOSIS — Z86718 Personal history of other venous thrombosis and embolism: Secondary | ICD-10-CM | POA: Insufficient documentation

## 2023-01-17 DIAGNOSIS — S0081XA Abrasion of other part of head, initial encounter: Secondary | ICD-10-CM | POA: Insufficient documentation

## 2023-01-17 DIAGNOSIS — R42 Dizziness and giddiness: Secondary | ICD-10-CM | POA: Diagnosis not present

## 2023-01-17 LAB — BASIC METABOLIC PANEL
Anion gap: 9 (ref 5–15)
BUN: 16 mg/dL (ref 8–23)
CO2: 23 mmol/L (ref 22–32)
Calcium: 8.5 mg/dL — ABNORMAL LOW (ref 8.9–10.3)
Chloride: 102 mmol/L (ref 98–111)
Creatinine, Ser: 1.2 mg/dL (ref 0.61–1.24)
GFR, Estimated: >60 mL/min (ref >60)
Glucose, Bld: 84 mg/dL (ref 70–99)
Potassium: 3.4 mmol/L — ABNORMAL LOW (ref 3.5–5.1)
Sodium: 134 mmol/L — ABNORMAL LOW (ref 135–145)

## 2023-01-17 LAB — CBC
HCT: 41.4 % (ref 39.0–52.0)
Hemoglobin: 14.8 g/dL (ref 13.0–17.0)
MCH: 31.6 pg (ref 26.0–34.0)
MCHC: 35.7 g/dL (ref 30.0–36.0)
MCV: 88.3 fL (ref 80.0–100.0)
Platelets: 79 10*3/uL — ABNORMAL LOW (ref 150–400)
RBC: 4.69 MIL/uL (ref 4.22–5.81)
RDW: 14.3 % (ref 11.5–15.5)
WBC: 2.7 10*3/uL — ABNORMAL LOW (ref 4.0–10.5)
nRBC: 0 % (ref 0.0–0.2)

## 2023-01-17 LAB — CBG MONITORING, ED: Glucose-Capillary: 95 mg/dL (ref 70–99)

## 2023-01-17 MED ORDER — MECLIZINE HCL 12.5 MG PO TABS: 25.0000 mg | ORAL_TABLET | Freq: Once | ORAL | Status: DC

## 2023-01-17 MED ORDER — ACETAMINOPHEN 325 MG PO TABS
650.0000 mg | ORAL_TABLET | Freq: Once | ORAL | Status: AC
  Administered 2023-01-17: 650 mg via ORAL
  Filled 2023-01-17: qty 2

## 2023-01-17 NOTE — ED Triage Notes (Signed)
Small lac noted to back of pts head.

## 2023-01-17 NOTE — ED Notes (Signed)
Patient transported to CT 

## 2023-01-17 NOTE — ED Triage Notes (Signed)
Pt BIB family after a fall that occurred around 4:30 pm. Pt bent over to get clothes out of the dryer and became dizzy. Pt DID hit head, denies LOC. Pt is on eliquis, last dose this morning.   Pt c/o L knee pain, L elbow pain, and L side head pain. Small abrasions noted to L elbow. No bleeding from and injuries at this time.

## 2023-01-17 NOTE — ED Provider Notes (Signed)
[place] Provider Note   CSN: [id number] Arrival date & time: 01/17/23  1654     History  Chief Complaint  Patient presents with   [name] [name] is a 67 y.o. male.   Fall   67 year old male presents emergency department after fall.  Fall occurred the patient was bending down getting close out of the dryer, felt dizzy/lightheadedness and fell backwards.  Patient reports trauma to head and is on blood thinner of Eliquis with last dose this morning due to DVT/PE.  Currently complaining of left elbow pain, left knee pain and headache.  States that he describes dizziness now as "feeling unstable on my feet" which was distinct from feeling lightheaded upon standing from bent over position that caused fall.  States that said symptoms have been persistent since fall.  Reports some blurry vision bilaterally that are been present since fall.  Denies weakness/sensory deficits in upper or lower extremities, slurred speech, facial droop.  Patient able to ambulate since incident without assistance.  Denies chest pain, shortness of breath, abdominal pain, nausea, vomiting, urinary symptoms, change in bowel habits.  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 Authorizing Provider  albuterol (VENTOLIN HFA) 108 (90 Base) MCG/ACT inhaler Inhale 2 puffs into the lungs every 6 (six) hours as needed for wheezing or shortness of breath.    [provider]  apixaban (ELIQUIS) 5 MG TABS tablet 2 po bid thru 2/25, then 1 po BID afterwards 12/29/22   Wynetta Emery, Clanford L, MD  carvedilol (COREG) 3.125 MG tablet Take 1 tablet (3.125 mg total) by mouth 2 (two) times daily. 12/31/22   [name], MD  citalopram (CELEXA) 20 MG tablet Take 1  tablet (20 mg total) by mouth at bedtime. 12/31/22   [name], MD  Continuous Blood Gluc Receiver (FREESTYLE LIBRE 2 READER) DEVI 1 Device by Does not apply route continuous. 12/31/22   [name], MD  Continuous Blood Gluc Sensor (FREESTYLE LIBRE 2 SENSOR) MISC Apply one sensor every 14 days 12/31/22   [name], MD  cyclobenzaprine (FLEXERIL) 10 MG tablet Take 1 tablet (10 mg total) by mouth 3 (three) times daily as needed for muscle spasms. 12/31/22   [name], MD  dapagliflozin propanediol (FARXIGA) 5 MG TABS tablet Take 1 tablet (5 mg total) by mouth daily before breakfast. 12/31/22 03/31/23  [name], MD  ezetimibe (ZETIA) 10 MG tablet Take 1 tablet (10 mg total) by mouth daily. 12/31/22   [name], MD  fluticasone (FLONASE) 50 MCG/ACT nasal spray Place 2 sprays into both nostrils 2 (two) times daily. 12/31/22   [name], MD  Fluticasone-Salmeterol (ADVAIR) 250-50 MCG/DOSE AEPB Inhale 1 puff into the lungs every 12 (twelve) hours.    [provider]  gabapentin (NEURONTIN) 300 MG capsule Take 1 capsule (300 mg total) by mouth 3 (three) times daily. Take 300 mg by mouth 3 (three) times daily. 12/31/22   [name], MD  isosorbide mononitrate (IMDUR) 60 MG 24 hr tablet Take one (1) tablet by mouth ( 60 mg ) in the am and one half tablet ( 30 mg) in the pm. 12/31/22   [name], MD  Melatonin 10 MG TABS Take 10 mg by mouth at  bedtime.    [provider]  metFORMIN (GLUCOPHAGE) 1000 MG tablet Take 1 tablet (1,000 mg total) by mouth 2 (two) times daily. 12/31/22   [name], MD  nitroGLYCERIN (NITROSTAT) 0.4 MG SL tablet Place 1 tablet (0.4 mg total) under the tongue every 5 (five) minutes x 3 doses as needed for chest pain (if no relief after 2nd dose, proceed to the ED for an evaluation or call 911). Place 0.4 mg under the tongue every 5 (five) minutes x 3 doses as needed for chest pain (if no relief after 2nd dose,  proceed to the ED for an evaluation or call 911). 12/31/22   [name], MD  omeprazole (PRILOSEC) 40 MG capsule Take 1 capsule (40 mg total) by mouth daily. 12/31/22   [name], MD  polycarbophil (FIBERCON) 625 MG tablet Take 625 mg by mouth daily.    [provider]  rosuvastatin (CRESTOR) 20 MG tablet Take 1 tablet (20 mg total) by mouth daily. 12/29/22 12/29/23  Johnson, Clanford L, MD  tirzepatide [facility]) 5 MG/0.5ML Pen Inject 5 mg into the skin once a week. 12/31/22   [name], MD  topiramate (TOPAMAX) 50 MG tablet Take 50 mg by mouth 2 (two) times daily. 11/20/22   [provider]      Allergies    Divalproex sodium, Statins, Tramadol, Valproic acid, Gadolinium derivatives, and Tricor [fenofibrate]    Review of Systems   Review of Systems  All other systems reviewed and are negative.   Physical Exam Updated Vital Signs BP 116/64   Pulse 77   Temp 97.8 F (36.6 C) (Oral)   Resp 16   Ht '5\' 9"'$  (1.753 m)   Wt 92.5 kg   SpO2 99%   BMI 30.13 kg/m  Physical Exam Vitals and nursing note reviewed.  Constitutional:      General: He is not in acute distress.    Appearance: He is well-developed.  HENT:     Head: Normocephalic.     Comments: Patient with small abrasion noted on left occipital region with no active bleeding.  No obvious laceration or foreign body retainment.    Right Ear: Tympanic membrane normal.     Left Ear: Tympanic membrane normal.  Eyes:     Extraocular Movements: Extraocular movements intact.     Conjunctiva/sclera: Conjunctivae normal.     Pupils: Pupils are equal, round, and reactive to light.  Cardiovascular:     Rate and Rhythm: Normal rate and regular rhythm.     Heart sounds: No murmur heard. Pulmonary:     Effort: Pulmonary effort is normal. No respiratory distress.     Breath sounds: Normal breath sounds. No wheezing, rhonchi or rales.  Chest:     Chest wall: No tenderness.  Abdominal:     Palpations:  Abdomen is soft.     Tenderness: There is no abdominal tenderness. There is no right CVA tenderness, left CVA tenderness, guarding or rebound.  Musculoskeletal:        General: No swelling.     Cervical back: Neck supple.     Comments: 9.  Tenderness of cervical, thoracic, lumbar spine without any step-off or deformity noted.  No chest wall tenderness.  No tender palpation of upper extremities.  Radial pulses 2+ bilaterally.  No sensory deficits along major nerve distributions of upper or lower extremities.  Patient with small abrasion and skin tear noted on left lateral elbow.  No active bleeding appreciated. Patient  with also abrasion noted on anterior left knee.  Mild tender to palpation over affected area but otherwise, bilateral lower extremities without tenderness to palpation.  Pedal pulses 2+ bilaterally.  Muscular strength symmetric bilateral upper and lower extremities.  Skin:    General: Skin is warm and dry.     Capillary Refill: Capillary refill takes less than 2 seconds.     Comments: No obvious petechiae/purpura on skin exam.  No obvious mucosal bleeding in oropharynx.  Neurological:     Mental Status: He is alert.     Comments: Alert and oriented to self, place, time and event.   Speech is fluent, clear without dysarthria or dysphasia.   Strength symmetric in upper/lower extremities   Sensation intact in upper/lower extremities   Normal gait.  Negative Romberg. No pronator drift.  Normal finger-to-nose and feet tapping.  CN I not tested  CN II not tested CN III, IV, VI PERRLA and EOMs intact bilaterally  CN V Intact sensation to sharp and light touch to the face  CN VII facial movements symmetric  CN VIII not tested  CN IX, X no uvula deviation, symmetric rise of soft palate  CN XI 5/5 SCM and trapezius strength bilaterally  CN XII Midline tongue protrusion, symmetric L/R movements     Psychiatric:        Mood and Affect: Mood normal.     ED Results / Procedures  / Treatments   Labs (all labs ordered are listed, but only abnormal results are displayed) Labs Reviewed  BASIC METABOLIC PANEL - Abnormal; Notable for the following components:      Result Value   Sodium 134 (*)    Potassium 3.4 (*)    Calcium 8.5 (*)    All other components within normal limits  CBC - Abnormal; Notable for the following components:   WBC 2.7 (*)    Platelets 79 (*)    All other components within normal limits  CBG MONITORING, ED    EKG None  Radiology DG Elbow Complete Left  Result Date: 01/17/2023 CLINICAL DATA:  Fall and left elbow pain. EXAM: LEFT ELBOW - COMPLETE 3+ VIEW COMPARISON:  None Available. FINDINGS: There is no acute fracture or dislocation. The bones are well mineralized. No significant arthritic changes. No joint effusion. The soft tissues are unremarkable. IMPRESSION: Negative. Electronically Signed   By: Anner Crete M.D.   On: 01/17/2023 18:51   DG Chest Port 1 View  Result Date: 01/17/2023 CLINICAL DATA:  Pain. EXAM: PORTABLE CHEST 1 VIEW COMPARISON:  12/26/2022 FINDINGS: The lungs are clear without focal pneumonia, edema, pneumothorax or pleural effusion. The cardiopericardial silhouette is within normal limits for size. The visualized bony structures of the thorax are unremarkable. IMPRESSION: No active disease. Electronically Signed   By: Misty Stanley M.D.   On: 01/17/2023 18:06   DG Pelvis Portable  Result Date: 01/17/2023 CLINICAL DATA:  Pain. EXAM: PORTABLE PELVIS 1-2 VIEWS COMPARISON:  None Available. FINDINGS: There is no evidence of pelvic fracture or diastasis. No pelvic bone lesions are seen. IMPRESSION: Negative. Electronically Signed   By: Misty Stanley M.D.   On: 01/17/2023 18:05   DG Knee Complete 4 Views Left  Result Date: 01/17/2023 CLINICAL DATA:  Pain.  Fall. EXAM: LEFT KNEE - COMPLETE 4+ VIEW COMPARISON:  None Available. FINDINGS: No evidence of fracture, dislocation, or joint effusion. No evidence of arthropathy or  other focal bone abnormality. Soft tissues are unremarkable. IMPRESSION: Negative. Electronically Signed   By:  Misty Stanley M.D.   On: 01/17/2023 18:04   CT Head Wo Contrast  Result Date: 01/17/2023 CLINICAL DATA:  Dizziness.  Head trauma. EXAM: CT HEAD WITHOUT CONTRAST CT CERVICAL SPINE WITHOUT CONTRAST TECHNIQUE: Multidetector CT imaging of the head and cervical spine was performed following the standard protocol without intravenous contrast. Multiplanar CT image reconstructions of the cervical spine were also generated. RADIATION DOSE REDUCTION: This exam was performed according to the departmental dose-optimization program which includes automated exposure control, adjustment of the mA and/or kV according to patient size and/or use of iterative reconstruction technique. COMPARISON:  Brain CT 12/27/2022 FINDINGS: CT HEAD FINDINGS Brain: No evidence for acute cortically based infarct, intracranial hemorrhage, mass lesion or mass effect. Punctate remote right cerebellar infarct. Basal ganglia calcifications. Vascular: No hyperdense vessel or unexpected calcification. Skull: Normal. Negative for fracture or focal lesion. Sinuses/Orbits: Fluid within the sphenoid sinus. Remainder of the paranasal sinuses unremarkable. Mastoid air cells unremarkable. Other: None CT CERVICAL SPINE FINDINGS Alignment: Straightening of the normal cervical lordosis. Skull base and vertebrae: No acute fracture. No primary bone lesion or focal pathologic process. Soft tissues and spinal canal: No prevertebral fluid or swelling. No visible canal hematoma. Disc levels: Osseous fusion C3-4 with fusion of the left C2-3 and C3-4 facets. Multilevel degenerative disc disease. Upper chest: Unremarkable. Other: Bilateral carotid arterial vascular calcifications. IMPRESSION: 1. No acute intracranial process. 2. No acute cervical spine fracture. Multilevel degenerative disc disease. Electronically Signed   By: Lovey Newcomer M.D.   On: 01/17/2023  17:44   CT Cervical Spine Wo Contrast  Result Date: 01/17/2023 CLINICAL DATA:  Dizziness.  Head trauma. EXAM: CT HEAD WITHOUT CONTRAST CT CERVICAL SPINE WITHOUT CONTRAST TECHNIQUE: Multidetector CT imaging of the head and cervical spine was performed following the standard protocol without intravenous contrast. Multiplanar CT image reconstructions of the cervical spine were also generated. RADIATION DOSE REDUCTION: This exam was performed according to the departmental dose-optimization program which includes automated exposure control, adjustment of the mA and/or kV according to patient size and/or use of iterative reconstruction technique. COMPARISON:  Brain CT 12/27/2022 FINDINGS: CT HEAD FINDINGS Brain: No evidence for acute cortically based infarct, intracranial hemorrhage, mass lesion or mass effect. Punctate remote right cerebellar infarct. Basal ganglia calcifications. Vascular: No hyperdense vessel or unexpected calcification. Skull: Normal. Negative for fracture or focal lesion. Sinuses/Orbits: Fluid within the sphenoid sinus. Remainder of the paranasal sinuses unremarkable. Mastoid air cells unremarkable. Other: None CT CERVICAL SPINE FINDINGS Alignment: Straightening of the normal cervical lordosis. Skull base and vertebrae: No acute fracture. No primary bone lesion or focal pathologic process. Soft tissues and spinal canal: No prevertebral fluid or swelling. No visible canal hematoma. Disc levels: Osseous fusion C3-4 with fusion of the left C2-3 and C3-4 facets. Multilevel degenerative disc disease. Upper chest: Unremarkable. Other: Bilateral carotid arterial vascular calcifications. IMPRESSION: 1. No acute intracranial process. 2. No acute cervical spine fracture. Multilevel degenerative disc disease. Electronically Signed   By: Lovey Newcomer M.D.   On: 01/17/2023 17:44    Procedures Procedures    Medications Ordered in ED Medications  acetaminophen (TYLENOL) tablet 650 mg (650 mg Oral Given  01/17/23 1837)    ED Course/ Medical Decision Making/ A&P                             Medical Decision Making Amount and/or Complexity of Data Reviewed Labs: ordered. Radiology: ordered.   This patient presents to the  ED for concern of fall, this involves an extensive number of treatment options, and is a complaint that carries with it a high risk of complications and morbidity.  The differential diagnosis includes CVA, fracture, strain chest pain, dislocation, spinal cord injury, solid organ damage, pneumothorax   Co morbidities that complicate the patient evaluation  See HPI   Additional history obtained:  Additional history obtained from EMR External records from outside source obtained and reviewed including hospital records   Lab Tests:  I Ordered, and personally interpreted labs.  The pertinent results include: Leukopenia as well as thrombocytopenia of 2.7 and 79 respectively of which seem to be patient's baseline.  No evidence of anemia.  Mild hyponatremia and hypokalemia 134 and 3.4 respectively.  No renal dysfunction.   Imaging Studies ordered:  I ordered imaging studies including CT head/C-spine, left elbow x-ray, chest x-ray, pelvis x-ray, left knee x-ray I independently visualized and interpreted imaging which showed  CT head/C-spine:No acute intracranial abnormality.  No acute fracture or traumatic subluxation of C-spine.  Multilevel degenerative changes. Left elbow x-ray: Negative for any acute fracture or dislocation. Chest x-ray: No acute cardiopulmonary abnormalities. Left knee x-ray: No acute osseous abnormality Pelvis x-ray: No acute osseous abnormality I agree with the radiologist interpretation   Cardiac Monitoring: / EKG:  The patient was maintained on a cardiac monitor.  I personally viewed and interpreted the cardiac monitored which showed an underlying rhythm of: Sinus rhythm with evidence of PVC and T wave inversion in V1 otherwise, without acute  ischemic changes.   Consultations Obtained:  I requested consultation with attending physician Dr. Truett Mainland who was in agreement with treatment plan going forward   Problem List / ED Course / Critical interventions / Medication management  Fall I ordered medication including Tylenol   Reevaluation of the patient after these medicines showed that the patient improved I have reviewed the patients home medicines and have made adjustments as needed   Social Determinants of Health:  Denies tobacco, illicit drug use.   Test / Admission - Considered:  Fall Vitals signs within normal range and stable throughout visit. Laboratory/imaging studies significant for: See above 67 year old male presents emergency department after mechanical fall.  Initial onset of dizziness/lightheadedness seems to be orthostatic in nature standing up from a bent over position.  Patient neurologically intact, without diplopia, able to ambulate, swallow and speak without difficulty.  Cerebellar exam relatively unremarkable as depicted above..  Patient's current visual symptoms as well as subjective send walking most likely secondary to concussion given CT imaging without acute abnormality, symptoms beginning after fall as well as reassuring neurologic exam.  Consider further imaging via MRI but concerning symptoms of subjective gait abnormality and visual changes seemed to have occurred after traumatic mechanism to head.  Doubt acute CVA.  Recommend reevaluation by primary care within 2 to 3 days.  Further workup deemed unnecessary at this time on the emergency department.  Discussion was had with attending physician Dr. Truett Mainland who is in agreement with treatment plan going forward.  Treatment plan was discussed at length with patient and family and they acknowledge understanding were agreeable to said plan. Worrisome signs and symptoms were discussed with the patient, and the patient acknowledged understanding to return  to the ED if noticed. Patient was stable upon discharge.          Final Clinical Impression(s) / ED Diagnoses Final diagnoses:  Fall, initial encounter  Injury of head, initial encounter    Rx / DC Orders  ED Discharge Orders     None         Wilnette Kales, [name] 01/17/23 1856    Cristie Hem, MD 01/18/23 1529

## 2023-01-17 NOTE — Discharge Instructions (Signed)
Note the workup today was overall reassuring.  CT imaging of your head and spine were without signs of brain bleed or fracture.  X-ray imaging of your chest, pelvis, left knee and left elbow were without signs of fracture or dislocation.  Symptoms most likely secondary to concussion.  Recommend taking Tylenol for pain/discomfort.  Recommend reevaluation by primary care within 2 to 3 days.  Please do not hesitate to return to emergency department for worrisome signs and symptoms we discussed become apparent.

## 2023-01-18 ENCOUNTER — Encounter: Payer: Self-pay | Admitting: Internal Medicine

## 2023-01-18 ENCOUNTER — Ambulatory Visit (INDEPENDENT_AMBULATORY_CARE_PROVIDER_SITE_OTHER): Payer: 59 | Admitting: Internal Medicine

## 2023-01-18 VITALS — BP 108/68 | HR 85 | Ht 69.0 in | Wt 202.6 lb

## 2023-01-18 DIAGNOSIS — W19XXXA Unspecified fall, initial encounter: Secondary | ICD-10-CM

## 2023-01-18 DIAGNOSIS — R42 Dizziness and giddiness: Secondary | ICD-10-CM

## 2023-01-18 NOTE — Patient Instructions (Signed)
It was a pleasure to see you today.  Thank you for giving [place] the opportunity to be involved in your care.  Below is a brief recap of your visit and next steps.  We will plan to see you again in May.  Summary No medication changes today I recommend as needed use of tylenol for pain relief Please let us know if your symptoms are not improving

## 2023-01-18 NOTE — Assessment & Plan Note (Signed)
Presenting today for an acute visit in the setting of recent falls.  He describes becoming dizzy upon bending forward at the waist yesterday afternoon and then proceeded to fall forward, hitting his head and left upper and lower extremities.  He presented to the ED for evaluation.  Imaging was negative for acute findings or fracture.  He had an additional fall this morning when attempting to get into his car.  This is described as mechanical in nature.  He denies any preceding dizziness or symptoms of orthostasis.  His exam today is unremarkable.  No neurologic deficits appreciated.  Strength and sensation remain intact. -No additional imaging/workup indicated for now.  I have recommended as needed use of Tylenol for pain relief.  He will return to care if his pain worsens, or dizziness persists.

## 2023-01-18 NOTE — Progress Notes (Signed)
Acute Office Visit  Subjective:     Patient ID: [name] [name], male    DOB: September 24, 1956, 67 y.o.   MRN: [id number]  Chief Complaint  Patient presents with   [name] [name] last night (01/17/23) and this morning (01/18/2023) hit back of head. Was dizzy last night when he fell and not sure why he fell this morning.   [name] [name] presents today for an acute visit in the setting of recent falls.  He reports falling yesterday afternoon (3/10) after bending forward to pick up something in his bedroom.  He then fell forward, hitting his head and left side.  He describes dizziness when standing upright prior to his fall.  Because of persistent dizziness and blurred vision after his initial fall, he presented to the emergency department for evaluation.  CT head did not demonstrate any bleeding or acute pathology.  CT C-spine, left elbow, chest, pelvis, and left knee, were negative for acute findings and fracture.  This morning [name] [name] fell again while getting into his car.  He describes this fall as mechanical in nature, noting that he gradually fell backwards, landing on his buttocks and did not hit his head.  He denies pain and dizziness currently.  He denies any recent falls prior to yesterday's events.  [name] [name] endorses compliance with Eliquis, which was recently started in the setting of DVT/PE.  Review of Systems  Musculoskeletal:  Positive for falls.  All other systems reviewed and are negative.       Objective:    BP 108/68   Pulse 85   Ht '5\' 9"'$  (1.753 m)   Wt 202 lb 9.6 oz (91.9 kg)   SpO2 96%   BMI 29.92 kg/m    Physical Exam Vitals reviewed.  Constitutional:      General: He is not in acute distress.    Appearance: Normal appearance. He is not ill-appearing.  HENT:     Head: Normocephalic and atraumatic.     Right Ear: External ear normal.     Left Ear: External ear normal.     Nose: Nose normal. No congestion or rhinorrhea.     Mouth/Throat:     Mouth: Mucous  membranes are moist.     Pharynx: Oropharynx is clear.  Eyes:     General: No scleral icterus.    Extraocular Movements: Extraocular movements intact.     Conjunctiva/sclera: Conjunctivae normal.     Pupils: Pupils are equal, round, and reactive to light.  Cardiovascular:     Rate and Rhythm: Normal rate and regular rhythm.     Pulses: Normal pulses.     Heart sounds: Murmur heard.  Pulmonary:     Effort: Pulmonary effort is normal.     Breath sounds: Normal breath sounds. No wheezing, rhonchi or rales.  Abdominal:     General: Abdomen is flat. Bowel sounds are normal. There is no distension.     Palpations: Abdomen is soft.     Tenderness: There is no abdominal tenderness.  Musculoskeletal:        General: No swelling or deformity. Normal range of motion.     Cervical back: Normal range of motion.  Skin:    General: Skin is warm and dry.     Capillary Refill: Capillary refill takes less than 2 seconds.     Findings: Lesion (Small abrasion with no active bleeding appreciated on superior aspect of scalp.  There is an abrasion on the left elbow with  no active bleeding.  There is an abrasion on the anterior aspect of the left knee with a bandage in place and no active bleeding.) present.  Neurological:     General: No focal deficit present.     Mental Status: He is alert and oriented to person, place, and time.     Motor: No weakness.  Psychiatric:        Mood and Affect: Mood normal.        Behavior: Behavior normal.        Thought Content: Thought content normal.       Assessment & Plan:   Problem List Items Addressed This Visit     Fall - Primary    Presenting today for an acute visit in the setting of recent falls.  He describes becoming dizzy upon bending forward at the waist yesterday afternoon and then proceeded to fall forward, hitting his head and left upper and lower extremities.  He presented to the ED for evaluation.  Imaging was negative for acute findings or  fracture.  He had an additional fall this morning when attempting to get into his car.  This is described as mechanical in nature.  He denies any preceding dizziness or symptoms of orthostasis.  His exam today is unremarkable.  No neurologic deficits appreciated.  Strength and sensation remain intact. -No additional imaging/workup indicated for now.  I have recommended as needed use of Tylenol for pain relief.  He will return to care if his pain worsens, or dizziness persists.      Return if symptoms worsen or fail to improve.  [name], MD

## 2023-01-19 DIAGNOSIS — G43119 Migraine with aura, intractable, without status migrainosus: Secondary | ICD-10-CM | POA: Diagnosis not present

## 2023-01-19 DIAGNOSIS — G43009 Migraine without aura, not intractable, without status migrainosus: Secondary | ICD-10-CM | POA: Diagnosis not present

## 2023-01-19 DIAGNOSIS — G459 Transient cerebral ischemic attack, unspecified: Secondary | ICD-10-CM | POA: Diagnosis not present

## 2023-01-25 ENCOUNTER — Telehealth: Payer: Self-pay

## 2023-01-25 NOTE — Telephone Encounter (Signed)
     Patient  visit  Forestine Na  on  3/10   Have you been able to follow up with your primary care physician? Yes   The patient was or was not able to obtain any needed medicine or equipment. Yes   Are there diet recommendations that you are having difficulty following? Na   Patient expresses understanding of discharge instructions and education provided has no other needs at this time.  Yes      [facility] [phone] [address], [place],  [address] Phone: [phone] Email: [email or web address]

## 2023-01-27 ENCOUNTER — Inpatient Hospital Stay (HOSPITAL_COMMUNITY)
Admission: EM | Admit: 2023-01-27 | Discharge: 2023-02-02 | DRG: 102 | Disposition: A | Discharge status: Home / Self Care | Payer: 59 | Attending: Internal Medicine | Admitting: Internal Medicine

## 2023-01-27 ENCOUNTER — Emergency Department (HOSPITAL_COMMUNITY): Payer: 59

## 2023-01-27 ENCOUNTER — Encounter (HOSPITAL_COMMUNITY): Payer: Self-pay | Admitting: Emergency Medicine

## 2023-01-27 ENCOUNTER — Other Ambulatory Visit (HOSPITAL_COMMUNITY): Payer: 59

## 2023-01-27 ENCOUNTER — Other Ambulatory Visit: Payer: Self-pay

## 2023-01-27 DIAGNOSIS — Z743 Need for continuous supervision: Secondary | ICD-10-CM | POA: Diagnosis not present

## 2023-01-27 DIAGNOSIS — Z7901 Long term (current) use of anticoagulants: Secondary | ICD-10-CM

## 2023-01-27 DIAGNOSIS — I4891 Unspecified atrial fibrillation: Secondary | ICD-10-CM | POA: Diagnosis not present

## 2023-01-27 DIAGNOSIS — I110 Hypertensive heart disease with heart failure: Secondary | ICD-10-CM | POA: Diagnosis not present

## 2023-01-27 DIAGNOSIS — R410 Disorientation, unspecified: Principal | ICD-10-CM

## 2023-01-27 DIAGNOSIS — Z955 Presence of coronary angioplasty implant and graft: Secondary | ICD-10-CM | POA: Diagnosis not present

## 2023-01-27 DIAGNOSIS — R4781 Slurred speech: Secondary | ICD-10-CM | POA: Diagnosis not present

## 2023-01-27 DIAGNOSIS — I639 Cerebral infarction, unspecified: Secondary | ICD-10-CM

## 2023-01-27 DIAGNOSIS — K7460 Unspecified cirrhosis of liver: Secondary | ICD-10-CM | POA: Diagnosis not present

## 2023-01-27 DIAGNOSIS — I5032 Chronic diastolic (congestive) heart failure: Secondary | ICD-10-CM

## 2023-01-27 DIAGNOSIS — Z8249 Family history of ischemic heart disease and other diseases of the circulatory system: Secondary | ICD-10-CM

## 2023-01-27 DIAGNOSIS — Z951 Presence of aortocoronary bypass graft: Secondary | ICD-10-CM | POA: Diagnosis not present

## 2023-01-27 DIAGNOSIS — Z86711 Personal history of pulmonary embolism: Secondary | ICD-10-CM

## 2023-01-27 DIAGNOSIS — E782 Mixed hyperlipidemia: Secondary | ICD-10-CM | POA: Diagnosis present

## 2023-01-27 DIAGNOSIS — I252 Old myocardial infarction: Secondary | ICD-10-CM | POA: Diagnosis not present

## 2023-01-27 DIAGNOSIS — G43909 Migraine, unspecified, not intractable, without status migrainosus: Secondary | ICD-10-CM | POA: Diagnosis not present

## 2023-01-27 DIAGNOSIS — I67841 Reversible cerebrovascular vasoconstriction syndrome: Secondary | ICD-10-CM | POA: Diagnosis present

## 2023-01-27 DIAGNOSIS — K219 Gastro-esophageal reflux disease without esophagitis: Secondary | ICD-10-CM | POA: Diagnosis not present

## 2023-01-27 DIAGNOSIS — R2990 Unspecified symptoms and signs involving the nervous system: Secondary | ICD-10-CM | POA: Diagnosis not present

## 2023-01-27 DIAGNOSIS — I6523 Occlusion and stenosis of bilateral carotid arteries: Secondary | ICD-10-CM | POA: Diagnosis not present

## 2023-01-27 DIAGNOSIS — D696 Thrombocytopenia, unspecified: Secondary | ICD-10-CM | POA: Diagnosis not present

## 2023-01-27 DIAGNOSIS — R519 Headache, unspecified: Secondary | ICD-10-CM

## 2023-01-27 DIAGNOSIS — R0602 Shortness of breath: Secondary | ICD-10-CM | POA: Diagnosis not present

## 2023-01-27 DIAGNOSIS — Z86718 Personal history of other venous thrombosis and embolism: Secondary | ICD-10-CM

## 2023-01-27 DIAGNOSIS — R4182 Altered mental status, unspecified: Secondary | ICD-10-CM | POA: Diagnosis not present

## 2023-01-27 DIAGNOSIS — E876 Hypokalemia: Secondary | ICD-10-CM | POA: Diagnosis not present

## 2023-01-27 DIAGNOSIS — Z885 Allergy status to narcotic agent status: Secondary | ICD-10-CM

## 2023-01-27 DIAGNOSIS — R29818 Other symptoms and signs involving the nervous system: Secondary | ICD-10-CM | POA: Diagnosis not present

## 2023-01-27 DIAGNOSIS — Q2112 Patent foramen ovale: Secondary | ICD-10-CM

## 2023-01-27 DIAGNOSIS — Z7985 Long-term (current) use of injectable non-insulin antidiabetic drugs: Secondary | ICD-10-CM

## 2023-01-27 DIAGNOSIS — I2510 Atherosclerotic heart disease of native coronary artery without angina pectoris: Secondary | ICD-10-CM | POA: Diagnosis present

## 2023-01-27 DIAGNOSIS — Z8673 Personal history of transient ischemic attack (TIA), and cerebral infarction without residual deficits: Secondary | ICD-10-CM

## 2023-01-27 DIAGNOSIS — G9340 Encephalopathy, unspecified: Secondary | ICD-10-CM | POA: Diagnosis not present

## 2023-01-27 DIAGNOSIS — E119 Type 2 diabetes mellitus without complications: Secondary | ICD-10-CM | POA: Diagnosis present

## 2023-01-27 DIAGNOSIS — K7581 Nonalcoholic steatohepatitis (NASH): Secondary | ICD-10-CM | POA: Diagnosis present

## 2023-01-27 DIAGNOSIS — Z820 Family history of epilepsy and other diseases of the nervous system: Secondary | ICD-10-CM

## 2023-01-27 DIAGNOSIS — E1169 Type 2 diabetes mellitus with other specified complication: Secondary | ICD-10-CM | POA: Diagnosis present

## 2023-01-27 DIAGNOSIS — G4733 Obstructive sleep apnea (adult) (pediatric): Secondary | ICD-10-CM | POA: Diagnosis present

## 2023-01-27 DIAGNOSIS — I10 Essential (primary) hypertension: Secondary | ICD-10-CM | POA: Diagnosis not present

## 2023-01-27 DIAGNOSIS — Z981 Arthrodesis status: Secondary | ICD-10-CM

## 2023-01-27 DIAGNOSIS — R404 Transient alteration of awareness: Secondary | ICD-10-CM | POA: Diagnosis not present

## 2023-01-27 DIAGNOSIS — Z7951 Long term (current) use of inhaled steroids: Secondary | ICD-10-CM

## 2023-01-27 DIAGNOSIS — F419 Anxiety disorder, unspecified: Secondary | ICD-10-CM | POA: Diagnosis present

## 2023-01-27 DIAGNOSIS — Z888 Allergy status to other drugs, medicaments and biological substances status: Secondary | ICD-10-CM

## 2023-01-27 DIAGNOSIS — Z79899 Other long term (current) drug therapy: Secondary | ICD-10-CM

## 2023-01-27 DIAGNOSIS — E1165 Type 2 diabetes mellitus with hyperglycemia: Secondary | ICD-10-CM | POA: Diagnosis not present

## 2023-01-27 DIAGNOSIS — Z8616 Personal history of COVID-19: Secondary | ICD-10-CM

## 2023-01-27 DIAGNOSIS — I6503 Occlusion and stenosis of bilateral vertebral arteries: Secondary | ICD-10-CM | POA: Diagnosis not present

## 2023-01-27 DIAGNOSIS — G9341 Metabolic encephalopathy: Secondary | ICD-10-CM | POA: Diagnosis not present

## 2023-01-27 DIAGNOSIS — G9389 Other specified disorders of brain: Secondary | ICD-10-CM | POA: Diagnosis not present

## 2023-01-27 DIAGNOSIS — Z825 Family history of asthma and other chronic lower respiratory diseases: Secondary | ICD-10-CM

## 2023-01-27 DIAGNOSIS — Z7984 Long term (current) use of oral hypoglycemic drugs: Secondary | ICD-10-CM

## 2023-01-27 DIAGNOSIS — R6889 Other general symptoms and signs: Secondary | ICD-10-CM | POA: Diagnosis not present

## 2023-01-27 DIAGNOSIS — Z823 Family history of stroke: Secondary | ICD-10-CM

## 2023-01-27 LAB — URINALYSIS, ROUTINE W REFLEX MICROSCOPIC
Bacteria, UA: NONE SEEN
Bilirubin Urine: NEGATIVE
Glucose, UA: NEGATIVE mg/dL
Hgb urine dipstick: NEGATIVE
Ketones, ur: 5 mg/dL — AB
Leukocytes,Ua: NEGATIVE
Nitrite: NEGATIVE
Protein, ur: 30 mg/dL — AB
Specific Gravity, Urine: >1.046 — ABNORMAL HIGH (ref 1.005–1.030)
pH: 6 (ref 5.0–8.0)

## 2023-01-27 LAB — I-STAT CHEM 8, ED
BUN: 15 mg/dL (ref 8–23)
Calcium, Ion: 1.22 mmol/L (ref 1.15–1.40)
Chloride: 108 mmol/L (ref 98–111)
Creatinine, Ser: 1 mg/dL (ref 0.61–1.24)
Glucose, Bld: 155 mg/dL — ABNORMAL HIGH (ref 70–99)
HCT: 46 % (ref 39.0–52.0)
Hemoglobin: 15.6 g/dL (ref 13.0–17.0)
Potassium: 3.9 mmol/L (ref 3.5–5.1)
Sodium: 142 mmol/L (ref 135–145)
TCO2: 20 mmol/L — ABNORMAL LOW (ref 22–32)

## 2023-01-27 LAB — RAPID URINE DRUG SCREEN, HOSP PERFORMED
Amphetamines: NOT DETECTED
Barbiturates: NOT DETECTED
Benzodiazepines: NOT DETECTED
Cocaine: NOT DETECTED
Opiates: NOT DETECTED
Tetrahydrocannabinol: NOT DETECTED

## 2023-01-27 LAB — DIFFERENTIAL
Abs Immature Granulocytes: 0.02 10*3/uL (ref 0.00–0.07)
Basophils Absolute: 0 10*3/uL (ref 0.0–0.1)
Basophils Relative: 0 %
Eosinophils Absolute: 0.1 10*3/uL (ref 0.0–0.5)
Eosinophils Relative: 2 %
Immature Granulocytes: 0 %
Lymphocytes Relative: 18 %
Lymphs Abs: 0.9 10*3/uL (ref 0.7–4.0)
Monocytes Absolute: 0.4 10*3/uL (ref 0.1–1.0)
Monocytes Relative: 8 %
Neutro Abs: 3.7 10*3/uL (ref 1.7–7.7)
Neutrophils Relative %: 72 %

## 2023-01-27 LAB — COMPREHENSIVE METABOLIC PANEL
ALT: 26 U/L (ref 0–44)
AST: 33 U/L (ref 15–41)
Albumin: 3.7 g/dL (ref 3.5–5.0)
Alkaline Phosphatase: 52 U/L (ref 38–126)
Anion gap: 11 (ref 5–15)
BUN: 15 mg/dL (ref 8–23)
CO2: 20 mmol/L — ABNORMAL LOW (ref 22–32)
Calcium: 9 mg/dL (ref 8.9–10.3)
Chloride: 106 mmol/L (ref 98–111)
Creatinine, Ser: 0.97 mg/dL (ref 0.61–1.24)
GFR, Estimated: >60 mL/min (ref >60)
Glucose, Bld: 155 mg/dL — ABNORMAL HIGH (ref 70–99)
Potassium: 3.7 mmol/L (ref 3.5–5.1)
Sodium: 137 mmol/L (ref 135–145)
Total Bilirubin: 1.3 mg/dL — ABNORMAL HIGH (ref 0.3–1.2)
Total Protein: 7.5 g/dL (ref 6.5–8.1)

## 2023-01-27 LAB — CBC
HCT: 45.5 % (ref 39.0–52.0)
Hemoglobin: 16.3 g/dL (ref 13.0–17.0)
MCH: 31.3 pg (ref 26.0–34.0)
MCHC: 35.8 g/dL (ref 30.0–36.0)
MCV: 87.5 fL (ref 80.0–100.0)
Platelets: 85 10*3/uL — ABNORMAL LOW (ref 150–400)
RBC: 5.2 MIL/uL (ref 4.22–5.81)
RDW: 14.7 % (ref 11.5–15.5)
WBC: 5.1 10*3/uL (ref 4.0–10.5)
nRBC: 0 % (ref 0.0–0.2)

## 2023-01-27 LAB — BLOOD GAS, VENOUS
Acid-base deficit: 3 mmol/L — ABNORMAL HIGH (ref 0.0–2.0)
Bicarbonate: 21.1 mmol/L (ref 20.0–28.0)
Drawn by: 442
O2 Saturation: 61 %
Patient temperature: 36.7
pCO2, Ven: 34 mmHg — ABNORMAL LOW (ref 44–60)
pH, Ven: 7.4 (ref 7.25–7.43)
pO2, Ven: 31 mmHg — CL (ref 32–45)

## 2023-01-27 LAB — APTT
aPTT: 35 seconds (ref 24–36)
aPTT: 120 seconds — ABNORMAL HIGH (ref 24–36)

## 2023-01-27 LAB — CBG MONITORING, ED
Glucose-Capillary: 161 mg/dL — ABNORMAL HIGH (ref 70–99)
Glucose-Capillary: 114 mg/dL — ABNORMAL HIGH (ref 70–99)

## 2023-01-27 LAB — FOLATE: Folate: 8.3 ng/mL (ref >5.9)

## 2023-01-27 LAB — TSH: TSH: 1.077 u[IU]/mL (ref 0.350–4.500)

## 2023-01-27 LAB — PROTIME-INR
INR: 1.6 — ABNORMAL HIGH (ref 0.8–1.2)
Prothrombin Time: 18.5 seconds — ABNORMAL HIGH (ref 11.4–15.2)

## 2023-01-27 LAB — GLUCOSE, CAPILLARY: Glucose-Capillary: 98 mg/dL (ref 70–99)

## 2023-01-27 LAB — VITAMIN B12: Vitamin B-12: 326 pg/mL (ref 180–914)

## 2023-01-27 LAB — ETHANOL: Alcohol, Ethyl (B): <10 mg/dL (ref <10)

## 2023-01-27 LAB — HEPARIN LEVEL (UNFRACTIONATED): Heparin Unfractionated: >1.1 IU/mL — ABNORMAL HIGH (ref 0.30–0.70)

## 2023-01-27 LAB — AMMONIA: Ammonia: 18 umol/L (ref 9–35)

## 2023-01-27 MED ORDER — HYDROMORPHONE HCL 1 MG/ML IJ SOLN
0.5000 mg | INTRAMUSCULAR | Status: DC | PRN
  Administered 2023-01-27 – 2023-01-29 (×5): 0.5 mg via INTRAVENOUS
  Filled 2023-01-27 (×5): qty 0.5

## 2023-01-27 MED ORDER — VANCOMYCIN HCL IN DEXTROSE 1-5 GM/200ML-% IV SOLN
1000.0000 mg | Freq: Three times a day (TID) | INTRAVENOUS | Status: DC
  Administered 2023-01-28 – 2023-01-30 (×6): 1000 mg via INTRAVENOUS
  Filled 2023-01-27 (×6): qty 200

## 2023-01-27 MED ORDER — HYDROCODONE-ACETAMINOPHEN 5-325 MG PO TABS
1.0000 | ORAL_TABLET | Freq: Four times a day (QID) | ORAL | Status: DC | PRN
  Administered 2023-01-27 – 2023-02-01 (×4): 1 via ORAL
  Filled 2023-01-27 (×5): qty 1

## 2023-01-27 MED ORDER — CARVEDILOL 3.125 MG PO TABS: 3.1250 mg | ORAL_TABLET | Freq: Two times a day (BID) | ORAL | Status: DC

## 2023-01-27 MED ORDER — ISOSORBIDE MONONITRATE ER 60 MG PO TB24
60.0000 mg | ORAL_TABLET | Freq: Every day | ORAL | Status: DC
  Administered 2023-01-28 – 2023-02-02 (×6): 60 mg via ORAL
  Filled 2023-01-27 (×6): qty 1

## 2023-01-27 MED ORDER — SODIUM CHLORIDE 0.9 % IV SOLN
2.0000 g | Freq: Once | INTRAVENOUS | Status: AC
  Administered 2023-01-27: 2 g via INTRAVENOUS
  Filled 2023-01-27: qty 20

## 2023-01-27 MED ORDER — DAPAGLIFLOZIN PROPANEDIOL 5 MG PO TABS
5.0000 mg | ORAL_TABLET | Freq: Every day | ORAL | Status: DC
  Administered 2023-01-28 – 2023-02-02 (×6): 5 mg via ORAL
  Filled 2023-01-27 (×9): qty 1

## 2023-01-27 MED ORDER — CARVEDILOL 3.125 MG PO TABS
3.1250 mg | ORAL_TABLET | Freq: Two times a day (BID) | ORAL | Status: DC
  Administered 2023-01-27 – 2023-02-02 (×12): 3.125 mg via ORAL
  Filled 2023-01-27 (×12): qty 1

## 2023-01-27 MED ORDER — ACETAMINOPHEN 650 MG RE SUPP: 650.0000 mg | Freq: Four times a day (QID) | RECTAL | Status: DC | PRN

## 2023-01-27 MED ORDER — METOCLOPRAMIDE HCL 5 MG/ML IJ SOLN
10.0000 mg | Freq: Once | INTRAMUSCULAR | Status: AC
  Administered 2023-01-28: 10 mg via INTRAVENOUS
  Filled 2023-01-27: qty 2

## 2023-01-27 MED ORDER — ZONISAMIDE 100 MG PO CAPS
100.0000 mg | ORAL_CAPSULE | Freq: Every day | ORAL | Status: DC
  Administered 2023-01-27 – 2023-02-02 (×7): 100 mg via ORAL
  Filled 2023-01-27 (×7): qty 1

## 2023-01-27 MED ORDER — ACETAMINOPHEN 325 MG PO TABS
650.0000 mg | ORAL_TABLET | Freq: Four times a day (QID) | ORAL | Status: DC | PRN
  Administered 2023-01-27: 650 mg via ORAL
  Filled 2023-01-27 (×3): qty 2

## 2023-01-27 MED ORDER — SODIUM CHLORIDE 0.9 % IV BOLUS
500.0000 mL | Freq: Once | INTRAVENOUS | Status: AC
  Administered 2023-01-27: 500 mL via INTRAVENOUS

## 2023-01-27 MED ORDER — ACETAMINOPHEN 325 MG PO TABS
ORAL_TABLET | ORAL | Status: AC
  Filled 2023-01-27: qty 2

## 2023-01-27 MED ORDER — PANTOPRAZOLE SODIUM 40 MG PO TBEC
40.0000 mg | DELAYED_RELEASE_TABLET | Freq: Every day | ORAL | Status: DC
  Administered 2023-01-27 – 2023-02-02 (×7): 40 mg via ORAL
  Filled 2023-01-27 (×7): qty 1

## 2023-01-27 MED ORDER — TOPIRAMATE 25 MG PO TABS
50.0000 mg | ORAL_TABLET | Freq: Two times a day (BID) | ORAL | Status: DC
  Administered 2023-01-27: 50 mg via ORAL
  Filled 2023-01-27 (×2): qty 2

## 2023-01-27 MED ORDER — MELATONIN 3 MG PO TABS
9.0000 mg | ORAL_TABLET | Freq: Every day | ORAL | Status: DC
  Administered 2023-01-27 – 2023-02-01 (×6): 9 mg via ORAL
  Filled 2023-01-27 (×6): qty 3

## 2023-01-27 MED ORDER — ALBUTEROL SULFATE (2.5 MG/3ML) 0.083% IN NEBU: 2.5000 mg | INHALATION_SOLUTION | Freq: Four times a day (QID) | RESPIRATORY_TRACT | Status: DC | PRN

## 2023-01-27 MED ORDER — SODIUM CHLORIDE 0.9 % IV SOLN
2.0000 g | Freq: Two times a day (BID) | INTRAVENOUS | Status: DC
  Administered 2023-01-28 – 2023-01-31 (×7): 2 g via INTRAVENOUS
  Filled 2023-01-27 (×7): qty 20

## 2023-01-27 MED ORDER — EZETIMIBE 10 MG PO TABS
10.0000 mg | ORAL_TABLET | Freq: Every day | ORAL | Status: DC
  Administered 2023-01-27 – 2023-02-02 (×7): 10 mg via ORAL
  Filled 2023-01-27 (×7): qty 1

## 2023-01-27 MED ORDER — HEPARIN (PORCINE) 25000 UT/250ML-% IV SOLN
1350.0000 [IU]/h | INTRAVENOUS | Status: DC
  Administered 2023-01-27: 1500 [IU]/h via INTRAVENOUS
  Administered 2023-01-28: 1350 [IU]/h via INTRAVENOUS
  Filled 2023-01-27 (×2): qty 250

## 2023-01-27 MED ORDER — ACETAMINOPHEN 325 MG PO TABS
650.0000 mg | ORAL_TABLET | Freq: Once | ORAL | Status: AC
  Administered 2023-01-27: 650 mg via ORAL
  Filled 2023-01-27: qty 2

## 2023-01-27 MED ORDER — CITALOPRAM HYDROBROMIDE 20 MG PO TABS
20.0000 mg | ORAL_TABLET | Freq: Every day | ORAL | Status: DC
  Administered 2023-01-27 – 2023-02-01 (×6): 20 mg via ORAL
  Filled 2023-01-27 (×6): qty 1

## 2023-01-27 MED ORDER — SODIUM CHLORIDE 0.9 % IV SOLN: 100.0000 mL/h | INTRAVENOUS | Status: DC

## 2023-01-27 MED ORDER — INSULIN ASPART 100 UNIT/ML IJ SOLN
0.0000 [IU] | Freq: Three times a day (TID) | INTRAMUSCULAR | Status: DC
  Administered 2023-01-28: 2 [IU] via SUBCUTANEOUS
  Administered 2023-01-29: 3 [IU] via SUBCUTANEOUS
  Administered 2023-01-29 (×2): 1 [IU] via SUBCUTANEOUS
  Administered 2023-01-30: 2 [IU] via SUBCUTANEOUS
  Administered 2023-01-31 – 2023-02-02 (×3): 1 [IU] via SUBCUTANEOUS

## 2023-01-27 MED ORDER — ISOSORBIDE MONONITRATE ER 30 MG PO TB24: 30.0000 mg | ORAL_TABLET | Freq: Every day | ORAL | Status: DC

## 2023-01-27 MED ORDER — INSULIN ASPART 100 UNIT/ML IJ SOLN
0.0000 [IU] | Freq: Every day | INTRAMUSCULAR | Status: DC
  Administered 2023-01-28: 2 [IU] via SUBCUTANEOUS

## 2023-01-27 MED ORDER — NALOXONE HCL 0.4 MG/ML IJ SOLN: 0.4000 mg | INTRAMUSCULAR | Status: DC | PRN

## 2023-01-27 MED ORDER — ONDANSETRON HCL 4 MG PO TABS: 4.0000 mg | ORAL_TABLET | Freq: Four times a day (QID) | ORAL | Status: DC | PRN

## 2023-01-27 MED ORDER — MOMETASONE FURO-FORMOTEROL FUM 200-5 MCG/ACT IN AERO
2.0000 | INHALATION_SPRAY | Freq: Two times a day (BID) | RESPIRATORY_TRACT | Status: DC
  Administered 2023-01-28 – 2023-02-02 (×10): 2 via RESPIRATORY_TRACT
  Filled 2023-01-27 (×3): qty 8.8

## 2023-01-27 MED ORDER — SODIUM CHLORIDE 0.9 % IV SOLN
2.0000 g | INTRAVENOUS | Status: DC
  Administered 2023-01-28 – 2023-01-31 (×21): 2 g via INTRAVENOUS
  Filled 2023-01-27 (×22): qty 2000

## 2023-01-27 MED ORDER — LABETALOL HCL 5 MG/ML IV SOLN: 10.0000 mg | INTRAVENOUS | Status: DC | PRN

## 2023-01-27 MED ORDER — ROSUVASTATIN CALCIUM 20 MG PO TABS
20.0000 mg | ORAL_TABLET | Freq: Every day | ORAL | Status: DC
  Administered 2023-01-27 – 2023-02-02 (×7): 20 mg via ORAL
  Filled 2023-01-27 (×7): qty 1

## 2023-01-27 MED ORDER — VANCOMYCIN HCL 2000 MG/400ML IV SOLN
2000.0000 mg | Freq: Once | INTRAVENOUS | Status: AC
  Administered 2023-01-28: 2000 mg via INTRAVENOUS
  Filled 2023-01-27: qty 400

## 2023-01-27 MED ORDER — VANCOMYCIN HCL IN DEXTROSE 1-5 GM/200ML-% IV SOLN: 1000.0000 mg | Freq: Once | INTRAVENOUS | Status: DC

## 2023-01-27 MED ORDER — IOHEXOL 350 MG/ML SOLN
75.0000 mL | Freq: Once | INTRAVENOUS | Status: AC | PRN
  Administered 2023-01-27: 75 mL via INTRAVENOUS

## 2023-01-27 MED ORDER — ISOSORBIDE MONONITRATE ER 30 MG PO TB24
30.0000 mg | ORAL_TABLET | Freq: Every day | ORAL | Status: DC
  Administered 2023-01-27 – 2023-02-01 (×6): 30 mg via ORAL
  Filled 2023-01-27 (×6): qty 1

## 2023-01-27 MED ORDER — ALBUTEROL SULFATE HFA 108 (90 BASE) MCG/ACT IN AERS: 2.0000 | INHALATION_SPRAY | Freq: Four times a day (QID) | RESPIRATORY_TRACT | Status: DC | PRN

## 2023-01-27 MED ORDER — ONDANSETRON HCL 4 MG/2ML IJ SOLN
4.0000 mg | Freq: Four times a day (QID) | INTRAMUSCULAR | Status: DC | PRN
  Administered 2023-01-29 – 2023-01-30 (×2): 4 mg via INTRAVENOUS
  Filled 2023-01-27 (×2): qty 2

## 2023-01-27 NOTE — Progress Notes (Addendum)
Pharmacy Antibiotic Note  [name] [name] is a 67 y.o. male admitted on 01/27/2023 with headache, dizziness, AMS, and generalized weakness.  Pharmacy has been consulted for vancomycin dosing for empiric coverage of meningitis. 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   WBC 5.1, afebrile SCr 1.0, at baseline SCr of 0.9-1  Pt initiated on Ceftriaxone 2g IV q12h and Ampicillin 2g IV q4h    Plan: Initiate loading dose of Vancomycin 2000mg  IV x 1, followed by  Vancomycin 1000mg  IV q8h    > Goal trough level ~15-20 mcg/mL    > Check vancomycin levels at steady state  Continue Ceftriaxone 2g IV q12h per MD Continue Ampicillin 2g IV q4h per MD Monitor daily CBC, temp, SCr, and for clinical signs of improvement  F/u cultures and de-escalate antibiotics as able  F/u MR Venogram Head  F/u CSF from lumbar puncture planned on 3/21  Height: 5\' 9"  (175.3 cm) Weight: 91.6 kg (202 lb) IBW/kg (Calculated) : 70.7  Temp (24hrs), Avg:98 F (36.7 C), Min:97.8 F (36.6 C), Max:98.3 F (36.8 C)  Recent Labs  Lab 01/27/23 1008 01/27/23 1018  WBC 5.1  --   CREATININE 0.97 1.00    Estimated Creatinine Clearance: 83.5 mL/min (by C-G formula based on SCr of 1 mg/dL).    Allergies  Allergen Reactions   Divalproex Sodium Other (See Comments)    Causes anger   Statins Other (See Comments)    Muscle aches and cramps   Tramadol Other (See Comments)    Chest pain    Valproic Acid Other (See Comments)    Causes anger   Gadolinium Derivatives Nausea And Vomiting    07/25/19 Pt vomited immediately after IV gad. Denies itching, dyspnea.  (Adverse, not allergic, reaction   Tricor [Fenofibrate] Other (See Comments)    Leg cramps    Antimicrobials this admission: Ceftriaxone 3/20 >>  Ampicillin 3/20 >>  Vancomycin 3/29 >>   Dose adjustments this admission: N/A  Microbiology results: None  Thank you for allowing  pharmacy to be a part of this patient's care.  Luisa Hart, PharmD, BCPS Clinical Pharmacist 01/27/2023 10:49 PM   Please refer to [facility] for pharmacy phone number

## 2023-01-27 NOTE — Progress Notes (Signed)
[facility] for heparin Indication: heparin bridge in anticipation of LP Allergies  Allergen Reactions   Divalproex Sodium Other (See Comments)    Causes anger   Statins Other (See Comments)    Muscle aches and cramps   Tramadol Other (See Comments)    Chest pain    Valproic Acid Other (See Comments)    Causes anger   Gadolinium Derivatives Nausea And Vomiting    07/25/19 Pt vomited immediately after IV gad. Denies itching, dyspnea.  (Adverse, not allergic, reaction   Tricor [Fenofibrate] Other (See Comments)    Leg cramps    Patient Measurements: Height: 5\' 9"  (175.3 cm) Weight: 91.6 kg (202 lb) IBW/kg (Calculated) : 70.7 Heparin Dosing Weight: 90kg  Vital Signs: Temp: 98.3 F (36.8 C) (03/20 1900) Temp Source: Oral (03/20 1900) BP: 177/102 (03/20 2015) Pulse Rate: 86 (03/20 2015)  Labs: Recent Labs    01/27/23 1008 01/27/23 1018  HGB 16.3 15.6  HCT 45.5 46.0  PLT 85*  --   APTT 35  --   LABPROT 18.5*  --   INR 1.6*  --   CREATININE 0.97 1.00     Estimated Creatinine Clearance: 83.5 mL/min (by C-G formula based on SCr of 1 mg/dL).   Medical History: Past Medical History:  Diagnosis Date   Anxiety    Aortic insufficiency    Arthritis    Asthma    Cataract    Right eye   Chronic lower back pain    Cirrhosis of liver (HCC)    Coronary atherosclerosis of native coronary artery    a. 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 Last dose of apixaban appears to be last night. Orders to bridge with heparin so that LP can be done. CBC appears normal. Will monitor aptt and heparin levels due to recent doac usage.   3/20 PM update:  Heparin level >1.1  aPTT 120  Labs not crossing in EPIC but showing on lab report   Goal of Therapy:  Heparin level 0.3-0.7 units/ml aPTT 66-102 seconds Monitor platelets by anticoagulation protocol: Yes   Plan:  Dec heparin to 1350 units/hr 8 hour aPTT and heparin level  Narda Bonds, PharmD, BCPS Clinical Pharmacist Phone: [phone]

## 2023-01-27 NOTE — ED Notes (Signed)
[facility] for pt arriving via EMS with reported weakness and slurred speech. 0959 Page to neuro 1000 Pt arrives 1003 Dr Earnestine Leys joins cart 70 Pt to [place] with wife on phone who reports pt was fine last pm around 7 while they were at [facility].  They returned home and between 2030-2130 Pt reported dizziness and was having some difficulty walking.  This am pt seemed fine when she left work at 0750 but at around 9 called her to report he didn't know how to put his clothes on .  Information relayed to Dr Earnestine Leys 1013 Pt returns from Ct

## 2023-01-27 NOTE — ED Notes (Signed)
[facility] called to transport patient. Nurse aware.

## 2023-01-27 NOTE — Progress Notes (Signed)
ANTICOAGULATION CONSULT NOTE - Initial Consult  Pharmacy Consult for heparin Indication: heparin bridge in anticipation of LP Allergies  Allergen Reactions   Divalproex Sodium Other (See Comments)    Causes anger   Statins Other (See Comments)    Muscle aches and cramps   Tramadol Other (See Comments)    Chest pain    Valproic Acid Other (See Comments)    Causes anger   Gadolinium Derivatives Nausea And Vomiting    07/25/19 Pt vomited immediately after IV gad. Denies itching, dyspnea.  (Adverse, not allergic, reaction   Tricor [Fenofibrate] Other (See Comments)    Leg cramps    Patient Measurements: Height: 5\' 9"  (175.3 cm) Weight: 91.6 kg (202 lb) IBW/kg (Calculated) : 70.7 Heparin Dosing Weight: 90kg  Vital Signs: Temp: 97.8 F (36.6 C) (03/20 1018) Temp Source: Axillary (03/20 1018) BP: 162/89 (03/20 1300) Pulse Rate: 86 (03/20 1300)  Labs: Recent Labs    01/27/23 1008 01/27/23 1018  HGB 16.3 15.6  HCT 45.5 46.0  PLT 85*  --   APTT 35  --   LABPROT 18.5*  --   INR 1.6*  --   CREATININE 0.97 1.00    Estimated Creatinine Clearance: 83.5 mL/min (by C-G formula based on SCr of 1 mg/dL).   Medical History: Past Medical History:  Diagnosis Date   Anxiety    Aortic insufficiency    Arthritis    Asthma    Cataract    Right eye   Chronic lower back pain    Cirrhosis of liver (HCC)    Coronary atherosclerosis of native coronary artery    a. 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 Last dose of apixaban appears to be last night. Orders to bridge with heparin so that LP can be done. CBC appears normal. Will monitor aptt and heparin levels due to recent doac usage.    Goal of Therapy:  Heparin level 0.3-0.7 units/ml aPTT 66-102 seconds Monitor platelets by anticoagulation protocol: Yes   Plan:  Omit heparin bolus given recent apixaban use Start heparin infusion at 1500 units/hr Check anti-Xa level in 6 hours and daily while on heparin Continue to monitor H&H and platelets  Erin Hearing PharmD., BCPS Clinical Pharmacist 01/27/2023 1:52 PM

## 2023-01-27 NOTE — ED Triage Notes (Signed)
Per ems. Wife found pt normal at 0750 but last night had dizziness and headache. Wife now in room and states at 0750 he was fine b/c she was leaving for work and she specifically asked if he was dizzy and pt stated he was fine with no dizziness. Pt called wife at 0900 and told her she needed help. Pt told her "I dont know anything".

## 2023-01-27 NOTE — Progress Notes (Signed)
TRH night cross cover note:   I was notified by RN that the patient continues to complain of headache, noting that previous dose of prn Norco is ineffective.  I subsequently placed order for as needed IV Dilaudid.  He is reported to be here with strokelike symptoms, with most recent systolic blood pressure in the 170s mmHg.  I subsequently placed prn IV labetalol order for systolic blood pressure greater than [id number] or diastolic blood pressure greater than 120 mmHg.      Babs Bertin, DO Hospitalist

## 2023-01-27 NOTE — H&P (Signed)
History and Physical    Patient: [name] [name] [id number] DOB: 1956-10-14 DOA: 01/27/2023 DOS: the patient was seen and examined on 01/27/2023 PCP: [name], MD  Patient coming from: Home  Chief Complaint:  Chief Complaint  Patient presents with   Code Stroke   HPI: [name] [name] is a 67 year old male with a history of  PE (Feb 2024) hypertension coronary artery disease status post multiple prior stents, CABG 2017, hypertension, hyperlipidemia, HFpEF, anxiety, fatty liver, TIAs, and thrombocytopenia presenting with headache, dizziness, altered mental status and generalized weakness.  The patient is a poor historian secondary to his altered mental status.  History is supplemented by the patient's spouse at the bedside.  Apparently, the patient's spouse noted the patient to have some gait instability around 8:30 PM on 01/26/2023.  At that time, the patient complained of a bifrontal headache and had an episode of nausea and vomiting.  He states that he also had 3 loose stools without hematochezia or melena.  He has not had any fevers, chills, chest pain, shortness breath, cough, hemoptysis.  He denies any sick contacts.  There is  no recent travels. The patient subsequently went to bed and woke up in the morning of 01/27/2023 and called his wife stating that he did not know how to put his clothes on and was confused.  The patient stated that " I do not know anything."  Apparently the patient did not have any slurred speech.  EMS was contacted and he was brought to emergency department where code stroke was activated. Notably, the patient was recently mated to the hospital from 12/26/2022 to 12/29/2022 secondary to an acute PE and left lower extremity DVT.  He had a COVID-19 infection at that time.  He was discharged home with apixaban.  Outside of that, he has not had any new medication. CT of the brain was negative for any acute findings.  CTA head and neck was negative for LVO.  There is  50% right proximal vertebral artery stenosis, 30% left proximal vertebral artery stenosis.  There was 25% stenosis of the bilateral ICA bulbs.  MRI of the brain showed small foci of restricted diffusion at the cortical frontal parietal vertex bilateral, right greater than left.  This was atypical for an infarct.  There was concern with differential diagnosis including meningitis, autoimmune cerebritis, seizure disease, and atypical watershed infarct.  Neurology was consulted and recommended transfer to [facility] for further evaluation and for lumbar puncture. Lumbar puncture was delayed secondary to patient taking apixaban.  His last dose was around 10 PM on 01/26/2023.  Review of Systems: As mentioned in the history of present illness. All other systems reviewed and are negative. Past Medical History:  Diagnosis Date   Anxiety    Aortic insufficiency    Arthritis    Asthma    Cataract    Right eye   Chronic lower back pain    Cirrhosis of liver ([place])    Coronary atherosclerosis of native coronary artery    a. 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   LIMA to LAD, free RIMA to OM2, SVG to PDA, open SVG harvest from right thigh   Stroke ([place])    Thrombocytopenia (HCC)    TIA (transient ischemic attack)    Type 2 diabetes mellitus ([place])    Past Surgical History:  Procedure Laterality Date   ANTERIOR CERVICAL DECOMP/DISCECTOMY FUSION  1998   "C3-4"   CARDIAC CATHETERIZATION  "several"   CARDIAC CATHETERIZATION N/A 12/30/2015   Procedure: Left Heart Cath and Coronary Angiography;  Surgeon: [name], MD;   Location: [place] CV LAB;  Service: Cardiovascular;  Laterality: N/A;   CARPAL TUNNEL RELEASE Bilateral 2005   CHOLECYSTECTOMY N/A 11/28/2018   Procedure: LAPAROSCOPIC CHOLECYSTECTOMY;  Surgeon: [name], MD;  Location: [facility] OR[facility];  Service: General;  Laterality: N/A;   COLONOSCOPY     COLONOSCOPY WITH PROPOFOL N/A 09/27/2020   Procedure: COLONOSCOPY WITH PROPOFOL;  Surgeon: [name], MD;  Location: [facility];  Service: Gastroenterology;  Laterality: N/A;   CORONARY ANGIOPLASTY     CORONARY ANGIOPLASTY WITH STENT PLACEMENT  2002; 2003; 11/20/2014   "I have 4 stents after today" (11/20/2014)   CORONARY ARTERY BYPASS GRAFT N/A 01/09/2016   Procedure: CORONARY ARTERY BYPASS GRAFTING (CABG) X 3 UTILIZING RIGHT AND LEFT INTERNAL MAMMARY ARTERY AND ENDOSCOPICALLY HARVESTED SAPHENEOUS VEIN.;  Surgeon: Rexene Alberts, MD;  Location: [place];  Service: Open Heart Surgery;  Laterality: N/A;   ESOPHAGOGASTRODUODENOSCOPY     ESOPHAGOGASTRODUODENOSCOPY (EGD) WITH PROPOFOL N/A 09/27/2020   Procedure: ESOPHAGOGASTRODUODENOSCOPY (EGD) WITH PROPOFOL;  Surgeon: [name], MD;  [facility]: [facility];  Service: Gastroenterology;  Laterality: N/A;  10   KNEE SURGERY Left 02/2012   "scraped; open"   LEFT HEART CATH AND CORS/GRAFTS ANGIOGRAPHY N/A 08/09/2017   Procedure: LEFT HEART CATH AND CORS/GRAFTS ANGIOGRAPHY;  Surgeon: [name], MD;  Location: [place] CV LAB;  Service: Cardiovascular;  Laterality: N/A;   LEFT HEART CATH AND CORS/GRAFTS ANGIOGRAPHY N/A 11/22/2018   Procedure: LEFT HEART CATH AND CORS/GRAFTS ANGIOGRAPHY;  Surgeon: [name], [name], MD;  Location: [place] CV LAB;  Service: Cardiovascular;  Laterality: N/A;   LEFT HEART CATH AND CORS/GRAFTS ANGIOGRAPHY N/A 08/20/2022   Procedure: LEFT HEART CATH AND CORS/GRAFTS ANGIOGRAPHY;  Surgeon: Burnell Blanks, MD;  Location: [place] CV LAB;  Service: Cardiovascular;  Laterality: N/A;   LEFT  HEART CATHETERIZATION WITH CORONARY ANGIOGRAM N/A 07/20/2012   Procedure: LEFT HEART CATHETERIZATION WITH CORONARY ANGIOGRAM;  Surgeon: [name], MD;  Location: [facility] CATH LAB;  Service: Cardiovascular;  Laterality: N/A;   LEFT HEART CATHETERIZATION WITH CORONARY ANGIOGRAM N/A 11/20/2014   Procedure: LEFT HEART CATHETERIZATION WITH CORONARY ANGIOGRAM;  Surgeon: [name], MD;  Location: [facility] C[facility] LAB;  Service: Cardiovascular;  Laterality: N/A;   LEFT HEART CATHETERIZATION WITH CORONARY ANGIOGRAM N/A 11/26/2014   Procedure: LEFT HEART CATHETERIZATION WITH CORONARY ANGIOGRAM;  Surgeon: [name], MD;  Location: [facility] CATH LAB;  Service: Cardiovascular;  Laterality: N/A;   NEUROPLASTY / TRANSPOSITION ULNAR NERVE AT ELBOW Right ~ 2012   PERCUTANEOUS CORONARY ROTOBLATOR INTERVENTION (PCI-R)  11/20/2014   Procedure: PERCUTANEOUS CORONARY ROTOBLATOR INTERVENTION (PCI-R);  Surgeon: [name], MD;  Location: [facility] CATH LAB;  Service: Cardiovascular;;   POSTERIOR CERVICAL LAMINECTOMY Left 04/16/2022   Procedure: Laminectomy and Foraminotomy - left - C6-C7;  Surgeon: Earnie Larsson, MD;  Location: [place];  Service: Neurosurgery;  Laterality: Left;  3C   SHOULDER ARTHROSCOPY Left ~ 2011   TEE WITHOUT CARDIOVERSION N/A 01/09/2016   Procedure: TRANSESOPHAGEAL ECHOCARDIOGRAM (TEE);  Surgeon: Rexene Alberts, MD;  Location: [place];  Service: Open Heart Surgery;  Laterality: N/A;   Social History:  reports that he has never smoked. He has never used smokeless tobacco. He reports that he does not drink alcohol and does not use drugs.  Allergies  Allergen Reactions   Divalproex Sodium Other (See Comments)    Causes anger   Statins Other (See Comments)    Muscle aches and cramps   Tramadol Other (See Comments)    Chest pain    Valproic Acid Other (See Comments)    Causes anger   Gadolinium Derivatives Nausea And Vomiting    07/25/19 Pt vomited immediately after IV gad. Denies itching, dyspnea.  (Adverse, not  allergic, reaction   Tricor [Fenofibrate] Other (See Comments)    Leg cramps    Family History  Problem Relation Age of Onset   Stroke Mother    Coronary artery disease Father 42   Asthma Sister    Multiple sclerosis Brother    Heart disease Paternal Uncle    Stroke Maternal Grandmother    Heart attack Paternal Grandmother    Cancer Paternal Grandfather    Asthma Sister    Seizures Son    Migraines Son    Autism Son    Migraines Son     Prior to Admission medications   Medication Sig Start Date End Date Taking? Authorizing Provider  albuterol (VENTOLIN HFA) 108 (90 Base) MCG/ACT inhaler Inhale 2 puffs into the lungs every 6 (six) hours as needed for wheezing or shortness of breath.   Yes [provider]  apixaban (ELIQUIS) 5 MG TABS tablet 2 po bid thru 2/25, then 1 po BID afterwards 12/29/22  Yes Johnson, Clanford L, MD  carvedilol (COREG) 3.125 MG tablet Take 1 tablet (3.125 mg total) by mouth 2 (two) times daily. 12/31/22  Yes [name], MD  citalopram (CELEXA) 20 MG tablet Take 1 tablet (20 mg total) by mouth at bedtime. 12/31/22  Yes [name], MD  cyclobenzaprine (FLEXERIL) 10 MG tablet Take 1 tablet (10 mg total) by mouth 3 (three) times daily as needed for muscle spasms. 12/31/22  Yes [name], MD  dapagliflozin propanediol (FARXIGA) 5 MG TABS tablet Take 1 tablet (5 mg total) by mouth daily before breakfast. 12/31/22 03/31/23 Yes [name], MD  ezetimibe (ZETIA) 10 MG tablet Take 1 tablet (10 mg total) by mouth daily. 12/31/22  Yes [name], MD  fluticasone (FLONASE) 50 MCG/ACT nasal spray Place 2 sprays into both nostrils 2 (two) times daily. 12/31/22  Yes [name], MD  Fluticasone-Salmeterol (ADVAIR) 250-50 MCG/DOSE AEPB Inhale 1 puff into the lungs every 12 (twelve) hours.   Yes [provider]  gabapentin (NEURONTIN) 300 MG capsule Take 1 capsule (300 mg total) by mouth 3 (three) times daily. Take 300 mg by mouth 3  (three) times daily. 12/31/22  Yes [name], MD  isosorbide mononitrate (IMDUR) 60 MG 24 hr tablet Take one (1) tablet by mouth ( 60 mg ) in the am and one half tablet ( 30 mg) in the pm. 12/31/22  Yes [name], MD  Melatonin 10 MG TABS Take 10 mg by mouth at bedtime.   Yes [provider]  metFORMIN (GLUCOPHAGE) 1000 MG tablet Take 1 tablet (1,000 mg total) by mouth 2 (  two) times daily. 12/31/22  Yes [name], MD  nitroGLYCERIN (NITROSTAT) 0.4 MG SL tablet Place 1 tablet (0.4 mg total) under the tongue every 5 (five) minutes x 3 doses as needed for chest pain (if no relief after 2nd dose, proceed to the ED for an evaluation or call 911). Place 0.4 mg under the tongue every 5 (five) minutes x 3 doses as needed for chest pain (if no relief after 2nd dose, proceed to the ED for an evaluation or call 911). 12/31/22  Yes [name], MD  omeprazole (PRILOSEC) 40 MG capsule Take 1 capsule (40 mg total) by mouth daily. 12/31/22  Yes [name], MD  polycarbophil (FIBERCON) 625 MG tablet Take 625 mg by mouth daily.   Yes [provider]  rosuvastatin (CRESTOR) 20 MG tablet Take 1 tablet (20 mg total) by mouth daily. 12/29/22 12/29/23 Yes Johnson, Clanford L, MD  tirzepatide [facility]) 5 MG/0.5ML Pen Inject 5 mg into the skin once a week. 12/31/22  Yes [name], MD  topiramate (TOPAMAX) 50 MG tablet Take 50 mg by mouth 2 (two) times daily. 11/20/22  Yes [provider]  zonisamide (ZONEGRAN) 100 MG capsule Take 100 mg by mouth daily. 01/19/23  Yes [provider]  Continuous Blood Gluc Receiver (FREESTYLE LIBRE 2 READER) DEVI 1 Device by Does not apply route continuous. 12/31/22   [name], MD  Continuous Blood Gluc Sensor (FREESTYLE LIBRE 2 SENSOR) MISC Apply one sensor every 14 days 12/31/22   [name], MD    Physical Exam: Vitals:   01/27/23 1230 01/27/23 1300 01/27/23 1330 01/27/23 1339  BP: (!) 144/79 (!) 162/89 (!)  159/83   Pulse: 82 86 83   Resp: 19 17 17    Temp:      TempSrc:      SpO2: 99% 100% 100%   Weight:    91.6 kg  Height:    5\' 9"  (1.753 m)   GENERAL:  A&O x 2, NAD, well developed, cooperative, follows commands HEENT: [place]/AT, No thrush, No icterus, No oral ulcers Neck:  No neck mass, No meningismus, soft, supple CV: RRR, no S3, no S4, no rub, no JVD Lungs:  CTA, no wheeze, no rhonchi, good air movement Abd: soft/NT +BS, nondistended Ext: No edema, no lymphangitis, no cyanosis, no rashes Neuro:  CN II-XII intact, strength 4/5 in RUE, RLE, strength 4/5 LUE, LLE; sensation intact bilateral; no dysmetria; babinski equivocal  Data Reviewed: Data reviewed above in history  Assessment and Plan: Headache/metabolic Encephalopathy -MR Brain--small foci of restricted diffusion at the cortical frontal parietal cortex bilateral, right greater than left. -Atypical appearance of embolic infarct -Differential diagnosis includes meningitis, autoimmune cerebritis, vasculitis, seizure disease and atypical watershed infarction. -Appreciate neurology consult>>Dr. Cheral Marker recommends transfer to [facility] -hold apixaban-last dose 01/26/2019 at 10 PM -start IV heparin bridge -B12 -Ammonia--18 -RPR -TSH -UA neg for pyuria -Planning for lumbar puncture 01/28/2023  DVT/PE -12/26/2022 CTA chest--right lung PE -Holding apixaban in anticipation for LP -IV heparin bridge  Coronary artery disease -No chest pain presently -Pravastatin was switched to Crestor on his last hospital admission February 2024 -Continue carvedilol and isosorbide  Mixed hyperlipidemia -Continue statin  Chronic HFpEF -Clinically euvolemic -Not requiring a standing diuretic at baseline  Thrombocytopenia -This appears to be chronic -Monitor for signs of bleeding  Uncontrolled diabetes mellitus type 2 with hyperglycemia -09/29/2021 hemoglobin A1c 7.8 -Number sliding scale  Anxiety -Continue Celexa    Advance Care Planning:  FULL  Consults: neurology  Family Communication: spouse at bedside 3/20    Author: Orson Eva, MD 01/27/2023 2:26 PM  For on call review [email or web address].

## 2023-01-27 NOTE — Progress Notes (Signed)
After arrival I was called because the patient was having significant headache and increasing dizziness.  On my evaluation, he repeatedly complains of headache, to the exclusion of answering other questions.  When asked what month it is, he answered all this, when asked what year he stated "I do not know my head just hurts."  He does not have any clear meningismus.  Given the findings on MRI coupled with a new headache, I do think an lumbar puncture would typically be indicated, but since this is not possible given his recent anticoagulation, he will need empiric antibiotic coverage pending LP.  Also, I will get an MR venogram to look for venous sinus thrombosis as possible etiology of the headache and MRI changes.  Neurology will continue to follow.  Roland Rack, MD [facility] [phone]  If 7pm- 7am, please page neurology on call as listed in [place].

## 2023-01-27 NOTE — ED Notes (Signed)
Will perform nih after hospitalist finishes his assessment.Dr Tat in with pt at this time

## 2023-01-27 NOTE — ED Notes (Signed)
0955 EMS activated code stroke - [place] paged out  [place]- [place] called  [phone]- Tele Neuro Activated

## 2023-01-27 NOTE — ED Provider Notes (Signed)
[facility] Provider Note   CSN: [id number] Arrival date & time: 01/27/23  1000  An emergency department physician performed an initial assessment on this suspected stroke patient at 1005.  History  Weakness, headache  DANGER MORSCH is a 67 y.o. male.  HPI   Patient presented to the ED with complaints of altered mental status headache.  Patient has history of asthma hypercholesterolemia GERD, diabetes, migraines, chronic back pain, thrombocytopenia.  Patient does have history of atrial fibrillation and is on chronic anticoagulation.  He was in the hospital 10 days ago for evaluation of a fall.  Patient presents to the ED for evaluation of headache and weakness.  Patient states he is not sure when the symptoms started.  The initial EMS report was the symptoms started at 0 750 this morning however when discussing with EMS it sounds like that is when he first reported his symptoms and he was noted to have dizziness and headache and weakness.  Code stroke was activated in the field.  Patient is able to answer my questions but cannot tell me when this all started.  He cannot tell me if it started last night or this morning.  He does feel of weakness all over.  He is having a headache.  No chest pain or abdominal pain  Home Medications Prior to Admission medications   Medication Sig Start Date End Date Taking? Authorizing Provider  albuterol (VENTOLIN HFA) 108 (90 Base) MCG/ACT inhaler Inhale 2 puffs into the lungs every 6 (six) hours as needed for wheezing or shortness of breath.   Yes [provider]  apixaban (ELIQUIS) 5 MG TABS tablet 2 po bid thru 2/25, then 1 po BID afterwards 12/29/22  Yes Johnson, Clanford L, MD  carvedilol (COREG) 3.125 MG tablet Take 1 tablet (3.125 mg total) by mouth 2 (two) times daily. 12/31/22  Yes [name], MD  citalopram (CELEXA) 20 MG tablet Take 1 tablet (20 mg total) by mouth at bedtime. 12/31/22  Yes  [name], MD  cyclobenzaprine (FLEXERIL) 10 MG tablet Take 1 tablet (10 mg total) by mouth 3 (three) times daily as needed for muscle spasms. 12/31/22  Yes [name], MD  dapagliflozin propanediol (FARXIGA) 5 MG TABS tablet Take 1 tablet (5 mg total) by mouth daily before breakfast. 12/31/22 03/31/23 Yes [name], MD  ezetimibe (ZETIA) 10 MG tablet Take 1 tablet (10 mg total) by mouth daily. 12/31/22  Yes [name], MD  fluticasone (FLONASE) 50 MCG/ACT nasal spray Place 2 sprays into both nostrils 2 (two) times daily. 12/31/22  Yes [name], MD  Fluticasone-Salmeterol (ADVAIR) 250-50 MCG/DOSE AEPB Inhale 1 puff into the lungs every 12 (twelve) hours.   Yes [provider]  gabapentin (NEURONTIN) 300 MG capsule Take 1 capsule (300 mg total) by mouth 3 (three) times daily. Take 300 mg by mouth 3 (three) times daily. 12/31/22  Yes [name], MD  isosorbide mononitrate (IMDUR) 60 MG 24 hr tablet Take one (1) tablet by mouth ( 60 mg ) in the am and one half tablet ( 30 mg) in the pm. 12/31/22  Yes [name], MD  Melatonin 10 MG TABS Take 10 mg by mouth at bedtime.   Yes [provider]  metFORMIN (GLUCOPHAGE) 1000 MG tablet Take 1 tablet (1,000 mg total) by mouth 2 (two) times daily. 12/31/22  Yes [name], MD  nitroGLYCERIN (NITROSTAT) 0.4 MG SL  tablet Place 1 tablet (0.4 mg total) under the tongue every 5 (five) minutes x 3 doses as needed for chest pain (if no relief after 2nd dose, proceed to the ED for an evaluation or call 911). Place 0.4 mg under the tongue every 5 (five) minutes x 3 doses as needed for chest pain (if no relief after 2nd dose, proceed to the ED for an evaluation or call 911). 12/31/22  Yes [name], MD  omeprazole (PRILOSEC) 40 MG capsule Take 1 capsule (40 mg total) by mouth daily. 12/31/22  Yes [name], MD  polycarbophil (FIBERCON) 625 MG tablet Take 625 mg by mouth daily.   Yes [provider]  rosuvastatin (CRESTOR) 20 MG tablet Take 1 tablet (20 mg total) by mouth daily. 12/29/22 12/29/23 Yes Johnson, Clanford L, MD  tirzepatide [facility]) 5 MG/0.5ML Pen Inject 5 mg into the skin once a week. 12/31/22  Yes [name], MD  topiramate (TOPAMAX) 50 MG tablet Take 50 mg by mouth 2 (two) times daily. 11/20/22  Yes [provider]  zonisamide (ZONEGRAN) 100 MG capsule Take 100 mg by mouth daily. 01/19/23  Yes [provider]  Continuous Blood Gluc Receiver (FREESTYLE LIBRE 2 READER) DEVI 1 Device by Does not apply route continuous. 12/31/22   [name], MD  Continuous Blood Gluc Sensor (FREESTYLE LIBRE 2 SENSOR) MISC Apply one sensor every 14 days 12/31/22   [name], MD      Allergies    Divalproex sodium, Statins, Tramadol, Valproic acid, Gadolinium derivatives, and Tricor [fenofibrate]    Review of Systems   Review of Systems  Physical Exam Updated Vital Signs BP (!) 162/89   Pulse 86   Temp 97.8 F (36.6 C) (Axillary)   Resp 17   Ht 1.753 m (5\' 9" )   Wt 91.6 kg   SpO2 100%   BMI 29.83 kg/m  Physical Exam Vitals and nursing note reviewed.  Constitutional:      General: He is not in acute distress.    Appearance: He is well-developed.  HENT:     Head: Normocephalic and atraumatic.     Right Ear: External ear normal.     Left Ear: External ear normal.  Eyes:     General: No visual field deficit or scleral icterus.       Right eye: No discharge.        Left eye: No discharge.     Conjunctiva/sclera: Conjunctivae normal.  Neck:     Trachea: No tracheal deviation.  Cardiovascular:     Rate and Rhythm: Normal rate and regular rhythm.  Pulmonary:     Effort: Pulmonary effort is normal. No respiratory distress.     Breath sounds: Normal breath sounds. No stridor. No wheezing or rales.  Abdominal:     General: Bowel sounds are normal. There is no distension.     Palpations: Abdomen is soft.     Tenderness: There is no abdominal  tenderness. There is no guarding or rebound.  Musculoskeletal:        General: No tenderness.     Cervical back: Neck supple.  Skin:    General: Skin is warm and dry.     Findings: No rash.  Neurological:     Mental Status: He is alert.     GCS: GCS eye subscore is 4. GCS verbal subscore is 4. GCS motor subscore is 6.     Cranial Nerves: No cranial nerve deficit, dysarthria  or facial asymmetry.     Motor: Weakness present. No abnormal muscle tone, seizure activity or pronator drift.     Coordination: Coordination normal.     Comments:  unable to hold both legs off bed for 5 seconds, patient has difficulty lifting arms or legs off the bed although will move them slightly against gravity  Psychiatric:        Mood and Affect: Mood normal.     ED Results / Procedures / Treatments   Labs (all labs ordered are listed, but only abnormal results are displayed) Labs Reviewed  PROTIME-INR - Abnormal; Notable for the following components:      Result Value   Prothrombin Time 18.5 (*)    INR 1.6 (*)    All other components within normal limits  CBC - Abnormal; Notable for the following components:   Platelets 85 (*)    All other components within normal limits  COMPREHENSIVE METABOLIC PANEL - Abnormal; Notable for the following components:   CO2 20 (*)    Glucose, Bld 155 (*)    Total Bilirubin 1.3 (*)    All other components within normal limits  URINALYSIS, ROUTINE W REFLEX MICROSCOPIC - Abnormal; Notable for the following components:   Specific Gravity, Urine >1.046 (*)    Ketones, ur 5 (*)    Protein, ur 30 (*)    All other components within normal limits  BLOOD GAS, VENOUS - Abnormal; Notable for the following components:   pCO2, Ven 34 (*)    pO2, Ven 31 (*)    Acid-base deficit 3.0 (*)    All other components within normal limits  I-STAT CHEM 8, ED - Abnormal; Notable for the following components:   Glucose, Bld 155 (*)    TCO2 20 (*)    All other components within normal  limits  CBG MONITORING, ED - Abnormal; Notable for the following components:   Glucose-Capillary 161 (*)    All other components within normal limits  ETHANOL  APTT  DIFFERENTIAL  AMMONIA  RAPID URINE DRUG SCREEN, HOSP PERFORMED    EKG EKG Interpretation  Date/Time:  Wednesday January 27 2023 10:26:56 EDT Ventricular Rate:  79 PR Interval:  145 QRS Duration: 86 QT Interval:  382 QTC Calculation: 438 R Axis:   60 Text Interpretation: Sinus rhythm Confirmed by Dorie Rank [phone]) on 01/27/2023 10:29:32 AM  Radiology MR BRAIN WO CONTRAST  Result Date: 01/27/2023 CLINICAL DATA:  Neuro deficit, acute, stroke suspected. EXAM: MRI HEAD WITHOUT CONTRAST TECHNIQUE: Multiplanar, multiecho pulse sequences of the brain and surrounding structures were obtained without intravenous contrast. COMPARISON:  Head CT and CT angiography same day FINDINGS: Brain: Diffusion imaging shows small areas of restricted diffusion affecting cortical brain at the frontoparietal vertex on both sides, slightly more prominent on the right than the left. No large confluent infarction. The pattern does not seem likely due to micro embolic infarctions from the heart or ascending aorta, though that is not completely excluded. Differential diagnosis would include atypical watershed insult, findings related to meningitis, autoimmune cerebritis, CJ viral disease, and in rare instances lymphoma of the brain. The brainstem is normal. Few old small vessel cerebellar infarctions. Cerebral hemispheres otherwise show minor small-vessel ischemic changes of the white matter. No ordinary mass, hemorrhage, hydrocephalus or extra-axial collection Vascular: Major vessels at the base of the brain show flow. Skull and upper cervical spine: Negative Sinuses/Orbits: Left sphenoid sinusitis with fluid opacification. Orbits negative. Other: None IMPRESSION: 1. Small foci of restricted diffusion at  the cortical frontoparietal vertex on both sides,  slightly more prominent on the right than the left. No large confluent infarction. The finding would be unusual for central origin micro embolic infarctions. See above differential discussion, which includes a wide range of other potential brain insults. 2. Mild chronic small-vessel ischemic changes elsewhere affecting the cerebellum and cerebral hemispheric white matter. 3. Left sphenoid sinusitis. Electronically Signed   By: Nelson Chimes M.D.   On: 01/27/2023 11:53   CT ANGIO HEAD NECK W WO CM  Result Date: 01/27/2023 CLINICAL DATA:  Neuro deficit, acute, stroke suspected. Fell 10 days ago. EXAM: CT ANGIOGRAPHY HEAD AND NECK TECHNIQUE: Multidetector CT imaging of the head and neck was performed using the standard protocol during bolus administration of intravenous contrast. Multiplanar CT image reconstructions and MIPs were obtained to evaluate the vascular anatomy. Carotid stenosis measurements (when applicable) are obtained utilizing NASCET criteria, using the distal internal carotid diameter as the denominator. RADIATION DOSE REDUCTION: This exam was performed according to the departmental dose-optimization program which includes automated exposure control, adjustment of the mA and/or kV according to patient size and/or use of iterative reconstruction technique. CONTRAST:  80mL OMNIPAQUE IOHEXOL 350 MG/ML SOLN COMPARISON:  Head CT earlier same day FINDINGS: CTA NECK FINDINGS Aortic arch: Aortic atherosclerosis. Right carotid system: Common carotid artery widely patent to the bifurcation with some calcified plaque. Carotid bifurcation shows extensive calcified plaque. ICA bulb shows soft and calcified plaque. Minimal diameter at the distal bulb measures 3 mm. Compared to a more distal cervical ICA diameter of 4 mm, this indicates a 25% stenosis. ICA widely patent beyond that. Left carotid system: Common carotid artery widely patent to the bifurcation. Soft and calcified plaque at the carotid bifurcation and  ICA bulb with mild irregularity. Minimal diameter at the distal ICA bulb measures 3 mm. Compared to a more distal cervical ICA diameter of 4 mm, this indicates a 25% stenosis. ICA widely patent beyond that. Vertebral arteries: Calcified plaque at both vertebral artery origins. 50% stenosis of the proximal right vertebral artery. 30% stenosis of the proximal left vertebral artery. Beyond that, both vessels are patent through the cervical region to the foramen magnum. Skeleton: Chronic fusion from C2 through C4. Mild degenerative change below that. Other neck: No soft tissue mass or lymphadenopathy. Upper chest: Lung apices are clear. Review of the MIP images confirms the above findings CTA HEAD FINDINGS Anterior circulation: Both internal carotid arteries are patent through the skull base and siphon regions. Ordinary siphon atherosclerosis without stenosis greater than 30%. The anterior and middle cerebral vessels are patent. No large vessel occlusion or proximal stenosis. Posterior circulation: Both vertebral arteries are patent through the foramen magnum to the basilar artery. No basilar stenosis. Posterior circulation branch vessels are normal. Venous sinuses: Patent and normal. Anatomic variants: None significant. Review of the MIP images confirms the above findings IMPRESSION: 1. No intracranial large vessel occlusion or correctable proximal stenosis. 2. Aortic atherosclerosis. 3. Atherosclerotic disease at both carotid bifurcations. 25% stenosis of both ICA bulb regions. 4. 50% stenosis of the proximal right vertebral artery. 30% stenosis of the proximal left vertebral artery. Beyond that, both vertebral arteries are patent through the cervical region to the basilar artery. Aortic Atherosclerosis (ICD10-I70.0). Electronically Signed   By: Nelson Chimes M.D.   On: 01/27/2023 11:39   CT HEAD CODE STROKE WO CONTRAST  Result Date: 01/27/2023 CLINICAL DATA:  Code stroke.  Neuro deficit, acute, stroke suspected  EXAM: CT HEAD WITHOUT CONTRAST TECHNIQUE: Contiguous  axial images were obtained from the base of the skull through the vertex without intravenous contrast. RADIATION DOSE REDUCTION: This exam was performed according to the departmental dose-optimization program which includes automated exposure control, adjustment of the mA and/or kV according to patient size and/or use of iterative reconstruction technique. COMPARISON:  CT head March 10, 24. FINDINGS: Brain: No evidence of acute infarction, hemorrhage, hydrocephalus, extra-axial collection or mass lesion/mass effect. Vascular: No hyperdense vessel. Skull: No acute fracture. Sinuses/Orbits: Partial opacification left sphenoid sinus and mild scattered mucosal thickening. No acute orbital findings. Other: No mastoid effusions. ASPECTS [facility] Stroke Program Early CT Score) total score (0-10 with 10 being normal): 10. IMPRESSION: 1. No evidence of acute intracranial abnormality. 2. ASPECTS is 10. Code stroke imaging results were communicated on 01/27/2023 at 10:18 am to provider Tomi Bamberger via telephone, who verbally acknowledged these results. Electronically Signed   By: Margaretha Sheffield M.D.   On: 01/27/2023 10:19    Procedures Procedures    Medications Ordered in ED Medications  sodium chloride 0.9 % bolus 500 mL (0 mLs Intravenous Stopped 01/27/23 1308)    Followed by  0.9 %  sodium chloride infusion (100 mL/hr Intravenous Rate/Dose Change 01/27/23 1333)  cefTRIAXone (ROCEPHIN) 2 g in sodium chloride 0.9 % 100 mL IVPB (has no administration in time range)  iohexol (OMNIPAQUE) 350 MG/ML injection 75 mL (75 mLs Intravenous Contrast Given 01/27/23 1118)  acetaminophen (TYLENOL) tablet 650 mg (650 mg Oral Given 01/27/23 1347)    ED Course/ Medical Decision Making/ A&P Clinical Course as of 01/27/23 1353  Wed Jan 27, 2023  1020 Stroke activated in the field.  Exact onset unclear.  Cannot clearly say that patient is within 4-hour window.  LVO negative [JK]   1129 Blood gas, venous (at [facility] and [facility])(!!) No evidence of acidosis [JK]  1129 I-stat chem 8, ED(!) Mild hyperglycemia doubt clinically significant [JK]  1129 CBC(!) Thrombocytopenia noted stable [JK]  1129 Ethanol Negative [JK]  1129 CT scan without acute findings [JK]  1145 Ammonia level normal [JK]  1219 MRI does show small foci of restricted diffusion at the cortical frontal parietal vertex on both sides [JK]  1219 CT angio does not show any acute occlusion.  Areas of stenosis noted [JK]  1316 Reviewed findings with Dr Cheral Marker.  Would recommend LP.  Pt will need admission for further workup.  WIll need to hold off eliquis before the LP [JK]  1353 Case discussed with Dr. Carles Collet regarding admission [JK]    Clinical Course User Index [JK] Dorie Rank, MD                             Medical Decision Making Problems Addressed: Confusion: acute illness or injury that poses a threat to life or bodily functions Nonintractable headache, unspecified chronicity pattern, unspecified headache type: acute illness or injury that poses a threat to life or bodily functions  Amount and/or Complexity of Data Reviewed Labs: ordered. Decision-making details documented in ED Course. Radiology: ordered and independent interpretation performed.  Risk OTC drugs. Prescription drug management. Decision regarding hospitalization.   Presented to the ED for evaluation of headache confusion.  Patient was activated as a code stroke although upon further investigation patient was outside tPA window.  Head CT did not show any acute abnormalities.  CT angio without an acute occlusion.  MRI however does show small cortical-based foci of restricted diffusion at the frontoparietal vertex on both sides.  Differential at this time does include infection, autoimmune cerebritis, small vessel vasculitis.  Discussed the case with Dr. Cheral Marker, neurology.  Patient would benefit from an LP however he is currently on Eliquis.   Willing to hold this for 24 hours.  Recommend admission to [facility] so the neurology service can be closely involved.  Start empirically on Rocephin.  I will consult the medical service.        Final Clinical Impression(s) / ED Diagnoses Final diagnoses:  Confusion  Nonintractable headache, unspecified chronicity pattern, unspecified headache type    Rx / DC Orders ED Discharge Orders     None         Dorie Rank, MD 01/27/23 1353

## 2023-01-27 NOTE — Progress Notes (Signed)
Admission Note:  Pt arrived to unit at approximately 1811 from [facility] due to stroke like symptoms. Initial NIH at [facility] was 8 and improved to NIH 1, when this Probation officer received report at 1530. At [place] here at [facility], NIH increased to 5, due to inattention, visual, ataxia, and sensory. Pt is also endorsed an 10/10 continuous generalized headache, he's had since 0900 this morning. Pt did receive a dose of Norco at 1648, with no effect. Neurologist Greta Doom, MD made aware of NIH findings. Pt passed bedside swallow evaluation. Pt's wife at bedside, with pt in NAD.

## 2023-01-27 NOTE — Hospital Course (Signed)
67 year old male with a history of  PE (Feb 2024) hypertension coronary artery disease status post multiple prior stents, CABG 2017, hypertension, hyperlipidemia, HFpEF, anxiety, fatty liver, TIAs, and thrombocytopenia presenting with headache, dizziness, altered mental status and generalized weakness.  The patient is a poor historian secondary to his altered mental status.  History is supplemented by the patient's spouse at the bedside.  Apparently, the patient's spouse noted the patient to have some gait instability around 8:30 PM on 01/26/2023.  At that time, the patient complained of a bifrontal headache and had an episode of nausea and vomiting.  He states that he also had 3 loose stools without hematochezia or melena.  He has not had any fevers, chills, chest pain, shortness breath, cough, hemoptysis.  He denies any sick contacts.  There is  no recent travels. The patient subsequently went to bed and woke up in the morning of 01/27/2023 and called his wife stating that he did not know how to put his clothes on and was confused.  The patient stated that " I do not know anything."  Apparently the patient did not have any slurred speech.  EMS was contacted and he was brought to emergency department where code stroke was activated. Notably, the patient was recently mated to the hospital from 12/26/2022 to 12/29/2022 secondary to an acute PE and left lower extremity DVT.  He had a COVID-19 infection at that time.  He was discharged home with apixaban.  Outside of that, he has not had any new medication. CT of the brain was negative for any acute findings.  CTA head and neck was negative for LVO.  There is 50% right proximal vertebral artery stenosis, 30% left proximal vertebral artery stenosis.  There was 25% stenosis of the bilateral ICA bulbs.  MRI of the brain showed small foci of restricted diffusion at the cortical frontal parietal vertex bilateral, right greater than left.  This was atypical for an infarct.   There was concern with differential diagnosis including meningitis, autoimmune cerebritis, seizure disease, and atypical watershed infarct.  Neurology was consulted and recommended transfer to [facility] for further evaluation and for lumbar puncture. Lumbar puncture was delayed secondary to patient taking apixaban.  His last dose was around 10 PM on 01/26/2023.

## 2023-01-27 NOTE — ED Notes (Signed)
Blood Glucose was 161

## 2023-01-27 NOTE — Consult Note (Addendum)
[facility] TeleNeurology Consult Services    Date of Service:  01/27/2023   Metrics: Last Known Well: 7:00 PM last night Symptoms: As per HPI.  Patient is not a candidate for thrombolytic. On oral anticoagulation.   Location of the provider: [facility]  Location of the patient: [facility] Pre-Morbid Modified Rankin Scale: 0 Time Code Stroke Page received:  0959 Time neurologist arrived:  1003 Time NIHSS completed: 1025    This consult was provided via telemedicine with 2-way video and audio communication. The patient/family was informed that care would be provided in this way and agreed to receive care in this manner.   ED Physician notified of diagnostic impression and management plan at: 10:30 AM   Assessment: 67 year old male with a prior history of stroke, multiple vascular risk factors and anxiety who presents with acute onset of confusion and "not feeling right".   - Exam with multiple functional attributes. NIHSS 8.  - CT head: No evidence of acute intracranial abnormality. ASPECTS is 10.  - DDx:  - Although there may be a mild underlying acute encephalopathy, the patient on exam exhibits poor effort and functional attributes including poor effort and bobbing drift of BUE and LLE on motor exam, apparent complete gaze palsy when assessing EOM that resolves with distraction, and altitudinal visual field defects in both eyes which is not consistent with a central etiology (possibly bilateral retinal pathology but bilateral acute BRAO would be unlikley).  - The exam findings are not referable to a single location along the neuraxis and overall presentation is unlikely to be secondary to acute stroke. From a risk/benefit standpoint, the patient is not a candidate for thrombolytic without further information from imaging due to low likelihood of acute stroke. Will need STAT MRI brain and CTA of head and neck to assess for possible right or left parietal  lobe stroke which could present with dressing apraxia   Recommendations: - STAT MRI brain with MRA of head. If negative, no further stroke work up is indicated - PT/OT and Speech evaluations - Toxic/metabolic encephalopathy work up - Interview patient to determine if there may have been a recent substance ingestion such as THC gummies which can present in this fashion   - No ASA listed on his home medications list, which would be indicated given that he has cardiac stents. Interview patient to clarify if he may be on ASA but no listed in Epic. If he is not on ASA determine if there is a contraindication. If no contraindication then start ASA.  - Continue Eliquis which he is on for DVT   Addendum: - MRI brain reveals small cortically based foci of restricted diffusion at the frontoparietal vertex on both sides, slightly more prominent on the right than the Left, without definite corresponding T2 hyperintensity. The appearance is unusual for central origin micro embolic infarctions. On review of the imaging by Neurology and correlation with the Radiology report, overall most likely differential diagnostic considreations include small-vessel CNS vasculitis, findings related to meningitis, autoimmune cerebritis and atypical CJD. Radiology report also mentions atypical watershed infarctions and CNS lymphoma, which are felt to be unlikely. Also seen on MRI are minor chronic small-vessel ischemic changes of the cerebellar and cerebral hemispheric white matter. Left sphenoid sinusitis is also noted.  - Will need LP for cell count with differential, protein, glucose, comprehensive infectious disease CSF PCR panel, IgG index, VDRL, oligoclonal bands and cytology (the latter will require 10 cc of CSF).  -  As he is on Elliquis, this will need to be switched to IV heparin for 24 hours, then IV heparin held for 1 hour prior to LP. Eliquis can then be restarted 6 hours after LP Webb Laws, et al. Pract Neurol  2018;0:1-11).       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 LKN was yesterday evening at 7 PM when wife interacted with him before leaving the house. At about 8:30 - 9:30 he called his wife and stated that he was having trouble walking across the room. He then went to bed and this AM called her again at 9 AM stating that he was confused and did not know how to put his clothes on, also stating "I don't know anything". Speech was not slurred or dysfluent. EMS was called and he was brought to the ED where a Code Stroke was called. Home medications include apixaban.   Recent labs from 3/10:: Na 134, K 3.4, Ca 8.5, glucose 84; BUN and Cr normal. eGFR > 60; WBC low at 2.7     Past Medical History: Past Medical History:  Diagnosis Date   Anxiety    Aortic insufficiency    Arthritis    Asthma    Cataract    Right eye   Chronic lower back pain    Cirrhosis of liver (HCC)    Coronary atherosclerosis of native coronary artery    a. 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   LIMA to LAD, free RIMA to OM2, SVG to PDA, open SVG harvest from right thigh   Stroke (HCC)    Thrombocytopenia (HCC)    TIA (transient ischemic attack)    Type 2 diabetes mellitus ([place])       Past Surgical History: Past Surgical  History:  Procedure Laterality Date   ANTERIOR CERVICAL DECOMP/DISCECTOMY FUSION  1998   "C3-4"   CARDIAC CATHETERIZATION  "several"   CARDIAC CATHETERIZATION N/A 12/30/2015   Procedure: Left Heart Cath and Coronary Angiography;  Surgeon: [name], MD;  Location: [facility];  Service: Cardiovascular;  Laterality: N/A;   CARPAL TUNNEL RELEASE Bilateral 2005   CHOLECYSTECTOMY N/A 11/28/2018   Procedure: LAPAROSCOPIC CHOLECYSTECTOMY;  Surgeon: [name], MD;  Location: [facility];  Service: General;  Laterality: N/A;   COLONOSCOPY     COLONOSCOPY WITH PROPOFOL N/A 09/27/2020   Procedure: COLONOSCOPY WITH PROPOFOL;  Surgeon: [name], MD;  Location: [facility];  Service: Gastroenterology;  Laterality: N/A;   CORONARY ANGIOPLASTY     CORONARY ANGIOPLASTY WITH STENT PLACEMENT  2002; 2003; 11/20/2014   "I have 4 stents after today" (11/20/2014)   CORONARY ARTERY BYPASS GRAFT N/A 01/09/2016   Procedure: CORONARY ARTERY BYPASS GRAFTING (CABG) X 3 UTILIZING  RIGHT AND LEFT INTERNAL MAMMARY ARTERY AND ENDOSCOPICALLY HARVESTED SAPHENEOUS VEIN.;  Surgeon: Rexene Alberts, MD;  Location: [place];  Service: Open Heart Surgery;  Laterality: N/A;   ESOPHAGOGASTRODUODENOSCOPY     ESOPHAGOGASTRODUODENOSCOPY (EGD) WITH PROPOFOL N/A 09/27/2020   Procedure: ESOPHAGOGASTRODUODENOSCOPY (EGD) WITH PROPOFOL;  Surgeon: [name], MD;  [facility]: [facility];  Service: Gastroenterology;  Laterality: N/A;  10   KNEE SURGERY Left 02/2012   "scraped; open"   LEFT HEART CATH AND CORS/GRAFTS ANGIOGRAPHY N/A 08/09/2017   Procedure: LEFT HEART CATH AND CORS/GRAFTS ANGIOGRAPHY;  Surgeon: [name], MD;  Location: [place] [facility];  Service: Cardiovascular;  Laterality: N/A;   LEFT HEART CATH AND CORS/GRAFTS ANGIOGRAPHY N/A 11/22/2018   Procedure: LEFT HEART CATH AND CORS/GRAFTS ANGIOGRAPHY;  Surgeon: [name], [name], MD;  Location: [place] [facility];  Service:  Cardiovascular;  Laterality: N/A;   LEFT HEART CATH AND CORS/GRAFTS ANGIOGRAPHY N/A 08/20/2022   Procedure: LEFT HEART CATH AND CORS/GRAFTS ANGIOGRAPHY;  Surgeon: Burnell Blanks, MD;  Location: [place] [facility];  Service: Cardiovascular;  Laterality: N/A;   LEFT HEART CATHETERIZATION WITH CORONARY ANGIOGRAM N/A 07/20/2012   Procedure: LEFT HEART CATHETERIZATION WITH CORONARY ANGIOGRAM;  Surgeon: [name], MD;  Location: [facility];  Service: Cardiovascular;  Laterality: N/A;   LEFT HEART CATHETERIZATION WITH CORONARY ANGIOGRAM N/A 11/20/2014   Procedure: LEFT HEART CATHETERIZATION WITH CORONARY ANGIOGRAM;  Surgeon: [name], MD;  Location: [facility];  Service: Cardiovascular;  Laterality: N/A;   LEFT HEART CATHETERIZATION WITH CORONARY ANGIOGRAM N/A 11/26/2014   Procedure: LEFT HEART CATHETERIZATION WITH CORONARY ANGIOGRAM;  Surgeon: [name], MD;  Location: [facility] C[facility];  Service: Cardiovascular;  Laterality: N/A;   NEUROPLASTY / TRANSPOSITION ULNAR NERVE AT ELBOW Right ~ 2012   PERCUTANEOUS CORONARY ROTOBLATOR INTERVENTION (PCI-R)  11/20/2014   Procedure: PERCUTANEOUS CORONARY ROTOBLATOR INTERVENTION (PCI-R);  Surgeon: [name], MD;  Location: [facility] CATH LAB;  Service: Cardiovascular;;   POSTERIOR CERVICAL LAMINECTOMY Left 04/16/2022   Procedure: Laminectomy and Foraminotomy - left - C6-C7;  Surgeon: Earnie Larsson, MD;  Location: [place];  Service: Neurosurgery;  Laterality: Left;  3C   SHOULDER ARTHROSCOPY Left ~ 2011   TEE WITHOUT CARDIOVERSION N/A 01/09/2016   Procedure: TRANSESOPHAGEAL ECHOCARDIOGRAM (TEE);  Surgeon: Rexene Alberts, MD;  Location: [place];  Service: Open Heart Surgery;  Laterality: N/A;     Medications:  No current facility-administered medications on file prior to encounter.   Current Outpatient Medications on File Prior to Encounter  Medication Sig Dispense Refill   albuterol (VENTOLIN HFA) 108 (90 Base) MCG/ACT inhaler Inhale 2 puffs into the  lungs every 6 (six) hours as needed for wheezing or shortness of breath.     apixaban (ELIQUIS) 5 MG TABS tablet 2 po bid thru 2/25, then 1 po BID afterwards 60 tablet 2   carvedilol (COREG) 3.125 MG tablet Take 1 tablet (3.125 mg total) by mouth 2 (two) times daily. 60 tablet 3   citalopram (CELEXA) 20 MG tablet Take 1 tablet (20 mg total) by mouth at bedtime. 90 tablet 0   Continuous Blood Gluc Receiver (FREESTYLE LIBRE 2 READER) DEVI 1 Device by Does not apply route continuous. 1 each 0   Continuous Blood Gluc Sensor (FREESTYLE LIBRE 2 SENSOR) MISC Apply one sensor every 14 days 6 each 3   cyclobenzaprine (FLEXERIL) 10 MG tablet Take 1 tablet (10 mg total) by mouth 3 (three) times daily as needed for  muscle spasms. 30 tablet 0   dapagliflozin propanediol (FARXIGA) 5 MG TABS tablet Take 1 tablet (5 mg total) by mouth daily before breakfast. 30 tablet 2   ezetimibe (ZETIA) 10 MG tablet Take 1 tablet (10 mg total) by mouth daily. 90 tablet 0   fluticasone (FLONASE) 50 MCG/ACT nasal spray Place 2 sprays into both nostrils 2 (two) times daily. 15.8 mL 3   Fluticasone-Salmeterol (ADVAIR) 250-50 MCG/DOSE AEPB Inhale 1 puff into the lungs every 12 (twelve) hours.     gabapentin (NEURONTIN) 300 MG capsule Take 1 capsule (300 mg total) by mouth 3 (three) times daily. Take 300 mg by mouth 3 (three) times daily. 270 capsule 0   isosorbide mononitrate (IMDUR) 60 MG 24 hr tablet Take one (1) tablet by mouth ( 60 mg ) in the am and one half tablet ( 30 mg) in the pm. 90 tablet 1   Melatonin 10 MG TABS Take 10 mg by mouth at bedtime.     metFORMIN (GLUCOPHAGE) 1000 MG tablet Take 1 tablet (1,000 mg total) by mouth 2 (two) times daily. 60 tablet 11   nitroGLYCERIN (NITROSTAT) 0.4 MG SL tablet Place 1 tablet (0.4 mg total) under the tongue every 5 (five) minutes x 3 doses as needed for chest pain (if no relief after 2nd dose, proceed to the ED for an evaluation or call 911). Place 0.4 mg under the tongue every 5  (five) minutes x 3 doses as needed for chest pain (if no relief after 2nd dose, proceed to the ED for an evaluation or call 911). 30 tablet 0   omeprazole (PRILOSEC) 40 MG capsule Take 1 capsule (40 mg total) by mouth daily. 30 capsule 3   polycarbophil (FIBERCON) 625 MG tablet Take 625 mg by mouth daily.     rosuvastatin (CRESTOR) 20 MG tablet Take 1 tablet (20 mg total) by mouth daily. 30 tablet 2   tirzepatide ([name]) 5 MG/0.5ML Pen Inject 5 mg into the skin once a week. 6 mL 1   topiramate (TOPAMAX) 50 MG tablet Take 50 mg by mouth 2 (two) times daily.          Social History: Drug Use: No EtOH: No Toibacco: No   Family History:  Reviewed in Epic   ROS: As per HPI    Anticoagulant use:  Yes   Antiplatelet use: None listed   Examination:    BP (!) 151/82 (BP Location: Right Arm)   Pulse 82   Temp 97.8 F (36.6 C) (Axillary)   Resp 17   SpO2 98%     1A: Level of Consciousness - 0 1B: Ask Month and Age - 1 1C: Blink Eyes & Squeeze Hands - 0 2: Test Horizontal Extraocular Movements - 0 3: Test Visual Fields - 0 (bilateral altitudinal defect regarding accuracy of finger counting in periphery, but can see fingers moving) 4: Test Facial Palsy (Use Grimace if Obtunded) - 0 5A: Test Left Arm Motor Drift - 2 (poor effort which improves with coaching) 5B: Test Right Arm Motor Drift - 2 (poor effort which improves with coaching) 6A: Test Left Leg Motor Drift - 2 6B: Test Right Leg Motor Drift - 0 7: Test Limb Ataxia (FNF/Heel-Shin) - 0 8: Test Sensation -  1 9: Test Language/Aphasia - 0 10: Test Dysarthria -: 0 11: Test Extinction/Inattention - Extinction to bilateral simultaneous stimulation 0   NIHSS Score: 8      Patient/Family was informed the Neurology Consult would occur via TeleHealth  consult by way of interactive audio and video telecommunications and consented to receiving care in this manner.   Patient is being evaluated for possible acute neurologic  impairment and high pretest probability of imminent or life-threatening deterioration. I spent total of 40 minutes providing care to this patient, including time for face to face visit via telemedicine, review of medical records, imaging studies and discussion of findings with providers, the patient and/or family.   Electronically signed: Dr. Kerney Elbe

## 2023-01-27 NOTE — ED Notes (Signed)
Pt came in slow to respond with words and following commands. Both are much better now. Per teleneuro, did not think this is a stroke.

## 2023-01-27 NOTE — ED Notes (Signed)
Taken to MRI. Pt was able to move self from stretcher to stretcher

## 2023-01-27 NOTE — ED Notes (Addendum)
Pt returned from MRI and CT. Awake. A/o x 4. Stated correct month/year. Nad. C/o headache. Unable to urinate at this time

## 2023-01-27 NOTE — ED Notes (Signed)
Arrived in room 2 after ct at 1011

## 2023-01-28 ENCOUNTER — Inpatient Hospital Stay (HOSPITAL_COMMUNITY): Payer: 59

## 2023-01-28 DIAGNOSIS — E1165 Type 2 diabetes mellitus with hyperglycemia: Secondary | ICD-10-CM

## 2023-01-28 DIAGNOSIS — D696 Thrombocytopenia, unspecified: Secondary | ICD-10-CM | POA: Diagnosis not present

## 2023-01-28 DIAGNOSIS — I5032 Chronic diastolic (congestive) heart failure: Secondary | ICD-10-CM | POA: Diagnosis not present

## 2023-01-28 DIAGNOSIS — G9341 Metabolic encephalopathy: Secondary | ICD-10-CM | POA: Diagnosis not present

## 2023-01-28 LAB — CBC
HCT: 38.7 % — ABNORMAL LOW (ref 39.0–52.0)
Hemoglobin: 14.2 g/dL (ref 13.0–17.0)
MCH: 31.9 pg (ref 26.0–34.0)
MCHC: 36.7 g/dL — ABNORMAL HIGH (ref 30.0–36.0)
MCV: 87 fL (ref 80.0–100.0)
Platelets: 66 10*3/uL — ABNORMAL LOW (ref 150–400)
RBC: 4.45 MIL/uL (ref 4.22–5.81)
RDW: 14.6 % (ref 11.5–15.5)
WBC: 4.7 10*3/uL (ref 4.0–10.5)
nRBC: 0 % (ref 0.0–0.2)

## 2023-01-28 LAB — BASIC METABOLIC PANEL
Anion gap: 10 (ref 5–15)
BUN: 9 mg/dL (ref 8–23)
CO2: 18 mmol/L — ABNORMAL LOW (ref 22–32)
Calcium: 8.3 mg/dL — ABNORMAL LOW (ref 8.9–10.3)
Chloride: 109 mmol/L (ref 98–111)
Creatinine, Ser: 0.87 mg/dL (ref 0.61–1.24)
GFR, Estimated: >60 mL/min (ref >60)
Glucose, Bld: 103 mg/dL — ABNORMAL HIGH (ref 70–99)
Potassium: 3.2 mmol/L — ABNORMAL LOW (ref 3.5–5.1)
Sodium: 137 mmol/L (ref 135–145)

## 2023-01-28 LAB — GLUCOSE, CAPILLARY
Glucose-Capillary: 109 mg/dL — ABNORMAL HIGH (ref 70–99)
Glucose-Capillary: 169 mg/dL — ABNORMAL HIGH (ref 70–99)
Glucose-Capillary: 199 mg/dL — ABNORMAL HIGH (ref 70–99)
Glucose-Capillary: 249 mg/dL — ABNORMAL HIGH (ref 70–99)

## 2023-01-28 LAB — HEMOGLOBIN A1C
Hgb A1c MFr Bld: 5.9 % — ABNORMAL HIGH (ref 4.8–5.6)
Mean Plasma Glucose: 123 mg/dL

## 2023-01-28 LAB — APTT
aPTT: >200 seconds (ref 24–36)
aPTT: 170 seconds (ref 24–36)

## 2023-01-28 LAB — HEPARIN LEVEL (UNFRACTIONATED): Heparin Unfractionated: >1.1 IU/mL — ABNORMAL HIGH (ref 0.30–0.70)

## 2023-01-28 LAB — RPR: RPR Ser Ql: NONREACTIVE

## 2023-01-28 MED ORDER — POTASSIUM CHLORIDE CRYS ER 20 MEQ PO TBCR
40.0000 meq | EXTENDED_RELEASE_TABLET | Freq: Once | ORAL | Status: AC
  Administered 2023-01-28: 40 meq via ORAL
  Filled 2023-01-28: qty 2

## 2023-01-28 MED ORDER — HEPARIN (PORCINE) 25000 UT/250ML-% IV SOLN
900.0000 [IU]/h | INTRAVENOUS | Status: DC
  Administered 2023-01-28 – 2023-01-29 (×2): 950 [IU]/h via INTRAVENOUS
  Filled 2023-01-28: qty 250

## 2023-01-28 MED ORDER — DEXAMETHASONE SODIUM PHOSPHATE 10 MG/ML IJ SOLN
10.0000 mg | Freq: Once | INTRAMUSCULAR | Status: AC
  Administered 2023-01-28: 10 mg via INTRAVENOUS
  Filled 2023-01-28: qty 1

## 2023-01-28 MED ORDER — HEPARIN (PORCINE) 25000 UT/250ML-% IV SOLN: 1150.0000 [IU]/h | INTRAVENOUS | Status: DC

## 2023-01-28 MED ORDER — ORAL CARE MOUTH RINSE: 15.0000 mL | OROMUCOSAL | Status: DC | PRN

## 2023-01-28 NOTE — Progress Notes (Signed)
[facility] night cross cover note:   I was notified by RN that the patient has had no urine output during this night shift thus far, with ensuing bladder scan revealing 464 cc.  Ensuing attempt at In-N-Out cath was met with resistance and inability to advance Foley catheter.  Will closely monitor ensuing PVRs with every 6 hours bladder scans.  I have placed a nursing communication order requesting the latter.      Babs Bertin, DO Hospitalist

## 2023-01-28 NOTE — Progress Notes (Signed)
Neurology Progress Note   S:// Patient seen and examined.  Continues to report of persistent headache. Started on meningitic coverage antibiotics overnight due to persistent headache  O:// Current vital signs: BP (!) 146/82 (BP Location: Right Arm)   Pulse 84   Temp 98.4 F (36.9 C) (Oral)   Resp 17   Ht 5\' 9"  (1.753 m)   Wt 91.6 kg   SpO2 97%   BMI 29.83 kg/m  Vital signs in last 24 hours: Temp:  [97.8 F (36.6 C)-98.4 F (36.9 C)] 98.4 F (36.9 C) (03/21 [id number]) Pulse Rate:  [78-86] 84 (03/21 0822) Resp:  [13-19] 17 (03/21 0300) BP: (109-177)/(58-102) 146/82 (03/21 0822) SpO2:  [95 %-100 %] 97 % (03/21 0822) Weight:  [91.6 kg] 91.6 kg (03/20 1339) General awake alert in no distress HEENT: Normocephalic atraumatic CVs: Regular rhythm Neurological exam Awake alert oriented x 3 No dysarthria No aphasia Mildly reduced attention concentration Cranials 2-12 intact Motor examination with no drift Sensation intact to light touch Coordination exam with no dysmetria  Medications  Current Facility-Administered Medications:    acetaminophen (TYLENOL) tablet 650 mg, 650 mg, Oral, Q6H PRN, 650 mg at 01/27/23 2023 **OR** acetaminophen (TYLENOL) suppository 650 mg, 650 mg, Rectal, Q6H PRN, Tat, David, MD   albuterol (PROVENTIL) (2.5 MG/3ML) 0.083% nebulizer solution 2.5 mg, 2.5 mg, Nebulization, Q6H PRN, Tat, David, MD   ampicillin (OMNIPEN) 2 g in sodium chloride 0.9 % 100 mL IVPB, 2 g, Intravenous, Q4H, Greta Doom, MD, Last Rate: 300 mL/hr at 01/28/23 0405, 2 g at 01/28/23 0405   carvedilol (COREG) tablet 3.125 mg, 3.125 mg, Oral, BID WC, Tat, Shanon Brow, MD, 3.125 mg at 01/27/23 1648   cefTRIAXone (ROCEPHIN) 2 g in sodium chloride 0.9 % 100 mL IVPB, 2 g, Intravenous, Q12H, Greta Doom, MD, Last Rate: 200 mL/hr at 01/28/23 [id number], Infusion Verify at 01/28/23 [id number]   citalopram (CELEXA) tablet 20 mg, 20 mg, Oral, QHS, Tat, Shanon Brow, MD, 20 mg at 01/27/23 2201    dapagliflozin propanediol (FARXIGA) tablet 5 mg, 5 mg, Oral, QAC breakfast, Tat, Shanon Brow, MD   dexamethasone (DECADRON) injection 10 mg, 10 mg, Intravenous, Once, Ghimire, Henreitta Leber, MD   ezetimibe (ZETIA) tablet 10 mg, 10 mg, Oral, Daily, Tat, David, MD, 10 mg at 01/27/23 1648   heparin ADULT infusion 100 units/mL (25000 units/299mL), 1,350 Units/hr, Intravenous, Continuous, Erenest Blank, RPH, Last Rate: 13.5 mL/hr at 01/28/23 0829, 1,350 Units/hr at 01/28/23 0829   HYDROcodone-acetaminophen (NORCO/VICODIN) 5-325 MG per tablet 1 tablet, 1 tablet, Oral, Q6H PRN, Tat, Shanon Brow, MD, 1 tablet at 01/27/23 1648   HYDROmorphone (DILAUDID) injection 0.5 mg, 0.5 mg, Intravenous, Q2H PRN, Howerter, Justin B, DO, 0.5 mg at 01/27/23 2210   insulin aspart (novoLOG) injection 0-5 Units, 0-5 Units, Subcutaneous, QHS, Tat, David, MD   insulin aspart (novoLOG) injection 0-9 Units, 0-9 Units, Subcutaneous, TID WC, Tat, David, MD   isosorbide mononitrate (IMDUR) 24 hr tablet 30 mg, 30 mg, Oral, QHS, Tat, Shanon Brow, MD, 30 mg at 01/27/23 2201   isosorbide mononitrate (IMDUR) 24 hr tablet 60 mg, 60 mg, Oral, Daily, Tat, David, MD   labetalol (NORMODYNE) injection 10 mg, 10 mg, Intravenous, Q2H PRN, Howerter, Justin B, DO   melatonin tablet 9 mg, 9 mg, Oral, QHS, Tat, Shanon Brow, MD, 9 mg at 01/27/23 2201   mometasone-formoterol (DULERA) 200-5 MCG/ACT inhaler 2 puff, 2 puff, Inhalation, BID, Tat, David, MD   naloxone [facility]) injection 0.4 mg, 0.4 mg, Intravenous, PRN, Howerter, Justin B,  DO   ondansetron (ZOFRAN) tablet 4 mg, 4 mg, Oral, Q6H PRN **OR** ondansetron (ZOFRAN) injection 4 mg, 4 mg, Intravenous, Q6H PRN, Tat, David, MD   Oral care mouth rinse, 15 mL, Mouth Rinse, PRN, Tat, David, MD   pantoprazole (PROTONIX) EC tablet 40 mg, 40 mg, Oral, Daily, Tat, David, MD, 40 mg at 01/27/23 1648   rosuvastatin (CRESTOR) tablet 20 mg, 20 mg, Oral, Daily, Tat, David, MD, 20 mg at 01/27/23 1648   topiramate (TOPAMAX) tablet 50 mg,  50 mg, Oral, BID, Tat, David, MD, 50 mg at 01/27/23 2201   vancomycin (VANCOCIN) IVPB 1000 mg/200 mL premix, 1,000 mg, Intravenous, Q8H, Jardin, Carla G, RPH   zonisamide (ZONEGRAN) capsule 100 mg, 100 mg, Oral, Daily, Tat, David, MD, 100 mg at 01/27/23 1648 Labs CBC    Component Value Date/Time   WBC 4.7 01/28/2023 0427   RBC 4.45 01/28/2023 0427   HGB 14.2 01/28/2023 0427   HGB 14.9 09/29/2022 1607   HCT 38.7 (L) 01/28/2023 0427   HCT 43.6 09/29/2022 1607   PLT 66 (L) 01/28/2023 0427   PLT 99 (LL) 09/29/2022 1607   MCV 87.0 01/28/2023 0427   MCV 94 09/29/2022 1607   MCH 31.9 01/28/2023 0427   MCHC 36.7 (H) 01/28/2023 0427   RDW 14.6 01/28/2023 0427   RDW 13.6 09/29/2022 1607   LYMPHSABS 0.9 01/27/2023 1008   LYMPHSABS 1.0 09/29/2022 1607   MONOABS 0.4 01/27/2023 1008   EOSABS 0.1 01/27/2023 1008   EOSABS 0.1 09/29/2022 1607   BASOSABS 0.0 01/27/2023 1008   BASOSABS 0.0 09/29/2022 1607    CMP     Component Value Date/Time   NA 137 01/28/2023 0427   NA 140 09/29/2022 1607   K 3.2 (L) 01/28/2023 0427   CL 109 01/28/2023 0427   CO2 18 (L) 01/28/2023 0427   GLUCOSE 103 (H) 01/28/2023 0427   BUN 9 01/28/2023 0427   BUN 16 09/29/2022 1607   CREATININE 0.87 01/28/2023 0427   CREATININE 1.04 08/15/2020 1451   CALCIUM 8.3 (L) 01/28/2023 0427   PROT 7.5 01/27/2023 1008   PROT 7.2 09/29/2022 1607   ALBUMIN 3.7 01/27/2023 1008   ALBUMIN 3.7 (L) 09/29/2022 1607   AST 33 01/27/2023 1008   ALT 26 01/27/2023 1008   ALKPHOS 52 01/27/2023 1008   BILITOT 1.3 (H) 01/27/2023 1008   BILITOT 0.4 09/29/2022 1607   GFRNONAA >60 01/28/2023 0427   GFRAA >60 07/27/2020 0525     Lipid Panel     Component Value Date/Time   CHOL 108 09/29/2022 1607   TRIG 112 09/29/2022 1607   HDL 32 (L) 09/29/2022 1607   CHOLHDL 3.4 09/29/2022 1607   CHOLHDL 6.8 07/28/2021 0531   VLDL 41 (H) 07/28/2021 0531   LDLCALC 55 09/29/2022 1607     Imaging I have reviewed images in epic and the  results pertinent to this consultation are: MRI of the brain done yesterday shows small foci of restricted diffusion in the cortical frontoparietal vertex in both sides, slightly more prominent on the right than the left.  There is no large confluent infarction.  Findings unusual for central origin microembolic infarctions-differentials include atypical watershed insults, meningitis, autoimmune cerebritis, lymphoma.  Assessment:  67 year old man with past medical history of migraines, prior stroke with no residual deficits, anxiety with acute onset of confusion, not feeling right and severe unremitting headache with imaging by MRI showing small cortically based foci of restricted diffusion at the frontoparietal vertex in both  size with broad differentials including that of vasculitis, autoimmune cerebritis, infectious meningitis and atypical CJD along with atypical watershed infarctions and CNS lymphoma.  An LP would be helpful but he was on Eliquis which will need to be held at least for 48 hours prior to attempting LP.  His headaches have been persistent-an MR venogram was recommended by the overnight neurologist which is pending at this time.  Impression: Persistent headache, altered mental status-broad differential as above  Recommendations: LP-send CSF for glucose, protein, cell count, CSF culture, Gram stain, meningitis/encephalitis panel, cytology, flow cytometry Head MRV-ordered and pending-we will follow Symptomatic management of headache per primary team as you are I would not be aversive to trying steroids 1 time with maybe 1 repeated dose to see if that helps him with headache. Plan discussed with Dr. Sloan Leiter  -- Amie Portland, MD Neurologist [facility] Pager: [phone]

## 2023-01-28 NOTE — Plan of Care (Signed)
  Problem: Education: Goal: Ability to describe self-care measures that may prevent or decrease complications (Diabetes Survival Skills Education) will improve Outcome: Progressing Goal: Individualized Educational Video(s) Outcome: Progressing   Problem: Education: Goal: Knowledge of General Education information will improve Description: Including pain rating scale, medication(s)/side effects and non-pharmacologic comfort measures Outcome: Progressing

## 2023-01-28 NOTE — Progress Notes (Signed)
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 Denies itching, dyspnea.  (Adverse, not allergic, reaction   Tricor [Fenofibrate] Other (See Comments)    Leg cramps    Patient Measurements: Height: 5\' 9"  (175.3 cm) Weight: 91.6 kg (202 lb) IBW/kg (Calculated) : 70.7 Heparin Dosing Weight: 90kg  Vital Signs: Temp: 97.8 F (36.6 C) (03/21 1942) Temp Source: Oral (03/21 1942) BP: 126/69 (03/21 1942) Pulse Rate: 78 (03/21 1942)  Labs: Recent Labs    01/27/23 1008 01/27/23 1018 01/27/23 2114 01/28/23 0427 01/28/23 0901 01/28/23 1818  HGB 16.3 15.6  --  14.2  --   --   HCT 45.5 46.0  --  38.7*  --   --   PLT 85*  --   --  66*  --   --   APTT 35  --  120*  --  >200* 170*  LABPROT 18.5*  --   --   --   --   --   INR 1.6*  --   --   --   --   --   HEPARINUNFRC  --   --  >1.10*  --  >1.10*  --   CREATININE 0.97 1.00  --  0.87  --   --      Estimated Creatinine Clearance: 96 mL/min (by C-G formula based on SCr of 0.87 mg/dL).  Assessment: 67 year old male on apixaban prior to admit for history of DVT/PE. Last dose of apixaban on 3/19. Orders to bridge with IV heparin so that LP can be done on 3/22.  Will monitor aPTT and heparin levels due to recent apixaban usage.   Heparin level remains >1.1  aPTT came back supratherapeutic again. Drawn correctly on opposite arm. No bleeding per Rn.   Goal of Therapy:  Heparin level 0.3-0.7 units/ml aPTT 66-102 seconds Monitor platelets by anticoagulation protocol: Yes   Plan:  Hold heparin infusion for 1 hr Decrease heparin drip to 950 units/hr aPTT/HL  in AM Daily heparin level, aPTT, CBC Monitor for s/sx of bleeding  Onnie Boer, PharmD, BCIDP, AAHIVP, CPP Infectious Disease Pharmacist 01/28/2023 7:48 PM

## 2023-01-28 NOTE — Progress Notes (Signed)
Pt able to stand at bedside to urinate. X2 person assist.

## 2023-01-28 NOTE — Progress Notes (Signed)
PROGRESS NOTE        PATIENT DETAILS Name: [name] [name] Age: 67 y.o. Sex: male Date of Birth: 12-09-1955 Admit Date: 01/27/2023 Admitting Physician Orson Eva, MD [id number], Hazle Nordmann, MD  Brief Summary: Patient is a 67 y.o.  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  He was initially seen at [facility] emergency room-and transferred to [facility] for neurology evaluation.  Significant events: 3/20>> admit to [facility]  Significant studies: 3/20>> MRI brain: Small foci of restricted diffusion at the cortical frontoparietal vertex on both sides. 3/20>> CT angio head/neck: No LVO.  25% stenosis of both ICA bulb regions, 50% stenosis of right vertebral artery, 30% stenosis of left vertebral artery.  Significant microbiology data: None  Procedures: None  Consults: Neurology  Subjective: Continues to have headaches-8/10-claims headache currently typical for his usual migraine headache.  Mentation has improved-he is able to answer most of my questions appropriately.  Objective: Vitals: Blood pressure (!) 146/82, pulse 84, temperature 98.4 F (36.9 C), temperature source Oral, resp. rate 17, height 5\' 9"  (1.753 m), weight 91.6 kg, SpO2 97 %.   Exam: Gen Exam:Alert awake-not in any distress HEENT:atraumatic, normocephalic Chest: B/L clear to auscultation anteriorly CVS:S1S2 regular Abdomen:soft non tender, non distended Extremities:no edema Neurology: Non focal Skin: no rash  Pertinent Labs/Radiology:    Latest Ref Rng & Units 01/28/2023    4:27 AM 01/27/2023   10:18 AM 01/27/2023   10:08 AM  CBC  WBC 4.0 - 10.5 K/uL 4.7   5.1   Hemoglobin 13.0 - 17.0 g/dL 14.2  15.6  16.3   Hematocrit 39.0 - 52.0 % 38.7  46.0  45.5   Platelets 150 - 400 K/uL 66   85     Lab Results  Component Value Date   NA 137 01/28/2023   K 3.2 (L) 01/28/2023    CL 109 01/28/2023   CO2 18 (L) 01/28/2023     Assessment/Plan: Intractable headache with metabolic encephalopathy Encephalopathy better-continues to have headache Seems to have improved after starting IV antibiotics last night Suspicion for infectious etiology-but differentials include vasculitis/autoimmune cerebritis etc-given MRI brain findings. 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  He does have splenomegaly per CT scan February 2023.  Anxiety Celexa  BMI: Estimated body mass index is 29.83 kg/m as calculated from the following:   Height as of this encounter: 5\' 9"  (1.753 m).  Weight as of this encounter: 91.6 kg.   Code status:   Code Status: Full Code   DVT Prophylaxis:IV heparin    Family Communication: Spouse at bedside   Disposition Plan: Status is: Inpatient Remains inpatient appropriate because: Severity of illness   Planned Discharge  Destination:Home   Diet: Diet Order             Diet Carb Modified Fluid consistency: Thin; Room service appropriate? Yes  Diet effective now                     Antimicrobial agents: Anti-infectives (From admission, onward)    Start     Dose/Rate Route Frequency Ordered Stop   01/28/23 0800  vancomycin (VANCOCIN) IVPB 1000 mg/200 mL premix        1,000 mg 200 mL/hr over 60 Minutes Intravenous Every 8 hours 01/27/23 2253     01/28/23 0000  cefTRIAXone (ROCEPHIN) 2 g in sodium chloride 0.9 % 100 mL IVPB        2 g 200 mL/hr over 30 Minutes Intravenous Every 12 hours 01/27/23 2213 02/10/23 2359   01/27/23 2330  vancomycin (VANCOREADY) IVPB 2000 mg/400 mL        2,000 mg 200 mL/hr over 120 Minutes Intravenous  Once 01/27/23 2234 01/28/23 0239   01/27/23 2300  vancomycin (VANCOCIN) IVPB 1000 mg/200 mL premix  Status:  Discontinued        1,000 mg 200 mL/hr over 60 Minutes Intravenous  Once 01/27/23 2213 01/27/23 2234   01/27/23 2300  ampicillin (OMNIPEN) 2 g in sodium chloride 0.9 % 100 mL IVPB        2 g 300 mL/hr over 20 Minutes Intravenous Every 4 hours 01/27/23 2213 02/10/23 2359   01/27/23 1400  cefTRIAXone (ROCEPHIN) 2 g in sodium chloride 0.9 % 100 mL IVPB        2 g 200 mL/hr over 30 Minutes Intravenous  Once 01/27/23 1345 01/27/23 1453        MEDICATIONS: Scheduled Meds:  carvedilol  3.125 mg Oral BID WC   citalopram  20 mg Oral QHS   dapagliflozin propanediol  5 mg Oral QAC breakfast   dexamethasone (DECADRON) injection  10 mg Intravenous Once   ezetimibe  10 mg Oral Daily   insulin aspart  0-5 Units Subcutaneous QHS   insulin aspart  0-9 Units Subcutaneous TID WC   isosorbide mononitrate  30 mg Oral QHS   isosorbide mononitrate  60 mg Oral Daily   melatonin  9 mg Oral QHS   mometasone-formoterol  2 puff Inhalation BID   pantoprazole  40 mg Oral Daily   potassium chloride  40 mEq Oral Once   rosuvastatin  20 mg Oral Daily   zonisamide  100 mg Oral  Daily   Continuous Infusions:  ampicillin (OMNIPEN) IV 2 g (01/28/23 0926)   cefTRIAXone (ROCEPHIN)  IV 200 mL/hr at 01/28/23 0312   heparin 1,350 Units/hr (01/28/23 0829)   vancomycin     PRN Meds:.acetaminophen **OR** acetaminophen, albuterol, HYDROcodone-acetaminophen, HYDROmorphone (DILAUDID) injection, labetalol, naLOXone (NARCAN)  injection, ondansetron **OR** ondansetron (ZOFRAN) IV, mouth rinse   I have personally reviewed following labs and imaging studies  LABORATORY DATA: CBC: Recent Labs  Lab 01/27/23 1008 01/27/23 1018 01/28/23 0427  WBC 5.1  --  4.7  NEUTROABS 3.7  --   --   HGB 16.3 15.6 14.2  HCT 45.5 46.0 38.7*  MCV 87.5  --  87.0  PLT 85*  --  66*    Basic Metabolic Panel: Recent Labs  Lab 01/27/23 1008 01/27/23 1018 01/28/23 0427  NA 137 142 137  K 3.7 3.9 3.2*  CL 106 108 109  CO2 20*  --  18*  GLUCOSE 155* 155* 103*  BUN 15 15 9   CREATININE 0.97 1.00 0.87  CALCIUM 9.0  --  8.3*    GFR: Estimated Creatinine Clearance: 96 mL/min (by C-G formula based on SCr of 0.87 mg/dL).  Liver Function Tests: Recent Labs  Lab 01/27/23 1008  AST 33  ALT 26  ALKPHOS 52  BILITOT 1.3*  PROT 7.5  ALBUMIN 3.7   No results for input(s): "LIPASE", "AMYLASE" in the last 168 hours. Recent Labs  Lab 01/27/23 1052  AMMONIA 18    Coagulation Profile: Recent Labs  Lab 01/27/23 1008  INR 1.6*    Cardiac Enzymes: No results for input(s): "CKTOTAL", "CKMB", "CKMBINDEX", "TROPONINI" in the last 168 hours.  BNP (last 3 results) No results for input(s): "PROBNP" in the last 8760 hours.  Lipid Profile: No results for input(s): "CHOL", "HDL", "LDLCALC", "TRIG", "CHOLHDL", "LDLDIRECT" in the last 72 hours.  Thyroid Function Tests: Recent Labs    01/27/23 1008  TSH 1.077    Anemia Panel: Recent Labs    01/27/23 1008  VITAMINB12 326  FOLATE 8.3    Urine analysis:    Component Value Date/Time   COLORURINE YELLOW 01/27/2023 1316    APPEARANCEUR CLEAR 01/27/2023 1316   LABSPEC >1.046 (H) 01/27/2023 1316   PHURINE 6.0 01/27/2023 1316   GLUCOSEU NEGATIVE 01/27/2023 1316   HGBUR NEGATIVE 01/27/2023 1316   BILIRUBINUR NEGATIVE 01/27/2023 1316   KETONESUR 5 (A) 01/27/2023 1316   PROTEINUR 30 (A) 01/27/2023 1316   UROBILINOGEN 0.2 12/04/2013 0730   NITRITE NEGATIVE 01/27/2023 1316   LEUKOCYTESUR NEGATIVE 01/27/2023 1316    Sepsis Labs: Lactic Acid, Venous    Component Value Date/Time   LATICACIDVEN 1.7 09/03/2021 1901    MICROBIOLOGY: No results found for this or any previous visit (from the past 240 hour(s)).  RADIOLOGY STUDIES/RESULTS: MR BRAIN WO CONTRAST  Result Date: 01/27/2023 CLINICAL DATA:  Neuro deficit, acute, stroke suspected. EXAM: MRI HEAD WITHOUT CONTRAST TECHNIQUE: Multiplanar, multiecho pulse sequences of the brain and surrounding structures were obtained without intravenous contrast. COMPARISON:  Head CT and CT angiography same day FINDINGS: Brain: Diffusion imaging shows small areas of restricted diffusion affecting cortical brain at the frontoparietal vertex on both sides, slightly more prominent on the right than the left. No large confluent infarction. The pattern does not seem likely due to micro embolic infarctions from the heart or ascending aorta, though that is not completely excluded. Differential diagnosis would include atypical watershed insult, findings related to meningitis, autoimmune cerebritis, CJ viral disease, and in rare instances lymphoma of the brain. The brainstem is normal. Few old small vessel cerebellar infarctions. Cerebral hemispheres otherwise show minor small-vessel ischemic changes of the white matter. No ordinary mass, hemorrhage, hydrocephalus or extra-axial collection Vascular: Major vessels at the base of the brain show flow. Skull and upper cervical spine: Negative Sinuses/Orbits: Left sphenoid sinusitis with fluid opacification. Orbits negative. Other: None IMPRESSION: 1.  Small foci of restricted diffusion at the cortical frontoparietal vertex on both sides, slightly more prominent on the right than the left. No large confluent infarction. The finding would be unusual for central origin micro embolic infarctions. See above differential discussion, which includes a wide range of other potential brain insults. 2. Mild chronic small-vessel ischemic changes elsewhere affecting  the cerebellum and cerebral hemispheric white matter. 3. Left sphenoid sinusitis. Electronically Signed   By: Nelson Chimes M.D.   On: 01/27/2023 11:53   CT ANGIO HEAD NECK W WO CM  Result Date: 01/27/2023 CLINICAL DATA:  Neuro deficit, acute, stroke suspected. Fell 10 days ago. EXAM: CT ANGIOGRAPHY HEAD AND NECK TECHNIQUE: Multidetector CT imaging of the head and neck was performed using the standard protocol during bolus administration of intravenous contrast. Multiplanar CT image reconstructions and MIPs were obtained to evaluate the vascular anatomy. Carotid stenosis measurements (when applicable) are obtained utilizing NASCET criteria, using the distal internal carotid diameter as the denominator. RADIATION DOSE REDUCTION: This exam was performed according to the departmental dose-optimization program which includes automated exposure control, adjustment of the mA and/or kV according to patient size and/or use of iterative reconstruction technique. CONTRAST:  70mL OMNIPAQUE IOHEXOL 350 MG/ML SOLN COMPARISON:  Head CT earlier same day FINDINGS: CTA NECK FINDINGS Aortic arch: Aortic atherosclerosis. Right carotid system: Common carotid artery widely patent to the bifurcation with some calcified plaque. Carotid bifurcation shows extensive calcified plaque. ICA bulb shows soft and calcified plaque. Minimal diameter at the distal bulb measures 3 mm. Compared to a more distal cervical ICA diameter of 4 mm, this indicates a 25% stenosis. ICA widely patent beyond that. Left carotid system: Common carotid artery  widely patent to the bifurcation. Soft and calcified plaque at the carotid bifurcation and ICA bulb with mild irregularity. Minimal diameter at the distal ICA bulb measures 3 mm. Compared to a more distal cervical ICA diameter of 4 mm, this indicates a 25% stenosis. ICA widely patent beyond that. Vertebral arteries: Calcified plaque at both vertebral artery origins. 50% stenosis of the proximal right vertebral artery. 30% stenosis of the proximal left vertebral artery. Beyond that, both vessels are patent through the cervical region to the foramen magnum. Skeleton: Chronic fusion from C2 through C4. Mild degenerative change below that. Other neck: No soft tissue mass or lymphadenopathy. Upper chest: Lung apices are clear. Review of the MIP images confirms the above findings CTA HEAD FINDINGS Anterior circulation: Both internal carotid arteries are patent through the skull base and siphon regions. Ordinary siphon atherosclerosis without stenosis greater than 30%. The anterior and middle cerebral vessels are patent. No large vessel occlusion or proximal stenosis. Posterior circulation: Both vertebral arteries are patent through the foramen magnum to the basilar artery. No basilar stenosis. Posterior circulation branch vessels are normal. Venous sinuses: Patent and normal. Anatomic variants: None significant. Review of the MIP images confirms the above findings IMPRESSION: 1. No intracranial large vessel occlusion or correctable proximal stenosis. 2. Aortic atherosclerosis. 3. Atherosclerotic disease at both carotid bifurcations. 25% stenosis of both ICA bulb regions. 4. 50% stenosis of the proximal right vertebral artery. 30% stenosis of the proximal left vertebral artery. Beyond that, both vertebral arteries are patent through the cervical region to the basilar artery. Aortic Atherosclerosis (ICD10-I70.0). Electronically Signed   By: Nelson Chimes M.D.   On: 01/27/2023 11:39   CT HEAD CODE STROKE WO  CONTRAST  Result Date: 01/27/2023 CLINICAL DATA:  Code stroke.  Neuro deficit, acute, stroke suspected EXAM: CT HEAD WITHOUT CONTRAST TECHNIQUE: Contiguous axial images were obtained from the base of the skull through the vertex without intravenous contrast. RADIATION DOSE REDUCTION: This exam was performed according to the departmental dose-optimization program which includes automated exposure control, adjustment of the mA and/or kV according to patient size and/or use of iterative reconstruction technique. COMPARISON:  CT head  March 10, 24. FINDINGS: Brain: No evidence of acute infarction, hemorrhage, hydrocephalus, extra-axial collection or mass lesion/mass effect. Vascular: No hyperdense vessel. Skull: No acute fracture. Sinuses/Orbits: Partial opacification left sphenoid sinus and mild scattered mucosal thickening. No acute orbital findings. Other: No mastoid effusions. ASPECTS [facility] Stroke Program Early CT Score) total score (0-10 with 10 being normal): 10. IMPRESSION: 1. No evidence of acute intracranial abnormality. 2. ASPECTS is 10. Code stroke imaging results were communicated on 01/27/2023 at 10:18 am to provider Tomi Bamberger via telephone, who verbally acknowledged these results. Electronically Signed   By: Margaretha Sheffield M.D.   On: 01/27/2023 10:19     LOS: 1 day   Oren Binet, MD  [facility]    To contact the attending provider between 7A-7P or the covering provider during after hours 7P-7A, please log into the web site [email or web address] and access using universal  password for that web site. If you do not have the password, please call the hospital operator.  01/28/2023, 9:29 AM

## 2023-01-28 NOTE — Plan of Care (Signed)

## 2023-01-28 NOTE — Progress Notes (Signed)
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 Denies itching, dyspnea.  (Adverse, not allergic, reaction   Tricor [Fenofibrate] Other (See Comments)    Leg cramps    Patient Measurements: Height: 5\' 9"  (175.3 cm) Weight: 91.6 kg (202 lb) IBW/kg (Calculated) : 70.7 Heparin Dosing Weight: 90kg  Vital Signs: Temp: 98.4 F (36.9 C) (03/21 0822) Temp Source: Oral (03/21 0822) BP: 146/82 (03/21 0822) Pulse Rate: 84 (03/21 0822)  Labs: Recent Labs    01/27/23 1008 01/27/23 1018 01/27/23 2114 01/28/23 0427 01/28/23 0901  HGB 16.3 15.6  --  14.2  --   HCT 45.5 46.0  --  38.7*  --   PLT 85*  --   --  66*  --   APTT 35  --  120*  --  >200*  LABPROT 18.5*  --   --   --   --   INR 1.6*  --   --   --   --   HEPARINUNFRC  --   --  >1.10*  --  >1.10*  CREATININE 0.97 1.00  --  0.87  --      Estimated Creatinine Clearance: 96 mL/min (by C-G formula based on SCr of 0.87 mg/dL).  Assessment: 67 year old male on apixaban prior to admit for history of DVT/PE. Last dose of apixaban on 3/19. Orders to bridge with IV heparin so that LP can be done on 3/22.  Will monitor aPTT and heparin levels due to recent apixaban usage.   Heparin level remains >1.1  aPTT >200 and supratherapeutic - drawn from opposite arm, no problems reported by RN No bleeding noted, Hgb stable, platelets are chronically low  Goal of Therapy:  Heparin level 0.3-0.7 units/ml aPTT 66-102 seconds Monitor platelets by anticoagulation protocol: Yes   Plan:  Hold heparin infusion for 1 hr Decrease heparin drip to 1150 units/hr 6-8 hour  aPTT Daily heparin level, aPTT, CBC Monitor for s/sx of bleeding  Thank you for involving pharmacy in this patient's care.  Renold Genta, PharmD, BCPS Clinical Pharmacist Clinical phone for 01/28/2023 is [phone] 01/28/2023 10:21 AM

## 2023-01-29 DIAGNOSIS — D696 Thrombocytopenia, unspecified: Secondary | ICD-10-CM | POA: Diagnosis not present

## 2023-01-29 DIAGNOSIS — I5032 Chronic diastolic (congestive) heart failure: Secondary | ICD-10-CM | POA: Diagnosis not present

## 2023-01-29 DIAGNOSIS — G9341 Metabolic encephalopathy: Secondary | ICD-10-CM | POA: Diagnosis not present

## 2023-01-29 DIAGNOSIS — E1165 Type 2 diabetes mellitus with hyperglycemia: Secondary | ICD-10-CM | POA: Diagnosis not present

## 2023-01-29 LAB — CBC
HCT: 38.4 % — ABNORMAL LOW (ref 39.0–52.0)
Hemoglobin: 13.7 g/dL (ref 13.0–17.0)
MCH: 31.3 pg (ref 26.0–34.0)
MCHC: 35.7 g/dL (ref 30.0–36.0)
MCV: 87.7 fL (ref 80.0–100.0)
Platelets: 61 10*3/uL — ABNORMAL LOW (ref 150–400)
RBC: 4.38 MIL/uL (ref 4.22–5.81)
RDW: 14.1 % (ref 11.5–15.5)
WBC: 5.2 10*3/uL (ref 4.0–10.5)
nRBC: 0 % (ref 0.0–0.2)

## 2023-01-29 LAB — GLUCOSE, CAPILLARY
Glucose-Capillary: 136 mg/dL — ABNORMAL HIGH (ref 70–99)
Glucose-Capillary: 145 mg/dL — ABNORMAL HIGH (ref 70–99)
Glucose-Capillary: 208 mg/dL — ABNORMAL HIGH (ref 70–99)
Glucose-Capillary: 147 mg/dL — ABNORMAL HIGH (ref 70–99)

## 2023-01-29 LAB — BASIC METABOLIC PANEL
Anion gap: 12 (ref 5–15)
BUN: 9 mg/dL (ref 8–23)
CO2: 17 mmol/L — ABNORMAL LOW (ref 22–32)
Calcium: 8.6 mg/dL — ABNORMAL LOW (ref 8.9–10.3)
Chloride: 110 mmol/L (ref 98–111)
Creatinine, Ser: 0.95 mg/dL (ref 0.61–1.24)
GFR, Estimated: >60 mL/min (ref >60)
Glucose, Bld: 160 mg/dL — ABNORMAL HIGH (ref 70–99)
Potassium: 3.4 mmol/L — ABNORMAL LOW (ref 3.5–5.1)
Sodium: 139 mmol/L (ref 135–145)

## 2023-01-29 LAB — URINE CULTURE: Culture: NO GROWTH

## 2023-01-29 LAB — APTT: aPTT: 101 seconds — ABNORMAL HIGH (ref 24–36)

## 2023-01-29 LAB — HEPARIN LEVEL (UNFRACTIONATED): Heparin Unfractionated: 0.55 IU/mL (ref 0.30–0.70)

## 2023-01-29 MED ORDER — MELATONIN 3 MG PO TABS: 3.0000 mg | ORAL_TABLET | Freq: Every evening | ORAL | Status: DC | PRN

## 2023-01-29 MED ORDER — POTASSIUM CHLORIDE CRYS ER 20 MEQ PO TBCR
40.0000 meq | EXTENDED_RELEASE_TABLET | Freq: Once | ORAL | Status: AC
  Administered 2023-01-29: 40 meq via ORAL
  Filled 2023-01-29: qty 2

## 2023-01-29 MED ORDER — LORAZEPAM 2 MG/ML IJ SOLN
2.0000 mg | INTRAMUSCULAR | Status: AC
  Administered 2023-01-29: 2 mg via INTRAVENOUS
  Filled 2023-01-29: qty 1

## 2023-01-29 NOTE — Progress Notes (Signed)
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 Denies itching, dyspnea.  (Adverse, not allergic, reaction   Tricor [Fenofibrate] Other (See Comments)    Leg cramps    Patient Measurements: Height: 5\' 9"  (175.3 cm) Weight: 91.6 kg (202 lb) IBW/kg (Calculated) : 70.7 Heparin Dosing Weight: 90 kg  Vital Signs: Temp: 98.7 F (37.1 C) (03/22 1607) Temp Source: Oral (03/22 1607) BP: 131/74 (03/22 1607) Pulse Rate: 80 (03/22 1607)  Labs: Recent Labs    01/27/23 1008 01/27/23 1018 01/27/23 2114 01/28/23 0427 01/28/23 0901 01/28/23 1818 01/29/23 0602  HGB 16.3 15.6  --  14.2  --   --  13.7  HCT 45.5 46.0  --  38.7*  --   --  38.4*  PLT 85*  --   --  66*  --   --  61*  APTT 35  --  120*  --  >200* 170* 101*  LABPROT 18.5*  --   --   --   --   --   --   INR 1.6*  --   --   --   --   --   --   HEPARINUNFRC  --   --  >1.10*  --  >1.10*  --  0.55  CREATININE 0.97 1.00  --  0.87  --   --  0.95     Estimated Creatinine Clearance: 87.9 mL/min (by C-G formula based on SCr of 0.95 mg/dL).  Assessment: 67 year old male on apixaban prior to admit for history of DVT/PE. Last dose of apixaban on 3/19. Orders to bridge with IV heparin so that LP can be done on 3/22.  Will monitor aPTT and heparin levels due to recent apixaban usage.   Heparin level is therapeutic at 0.55, aPTT is therapeutic at 101 (upper end of goal range) - appears heparin level and aPTT are beginning to correlate No problems reported by RN No bleeding noted, Hgb stable, platelets are chronically low  Failed LP today and IR can't do it  today. D/w Dr Rory Percy and we will resume heparin 2 hr post LP attempts.   Goal of Therapy:  Heparin level 0.3-0.7 units/ml aPTT 66-102 seconds Monitor platelets by anticoagulation protocol: Yes   Plan:  Resume heparin drip 900 units/hr AM aPTT, heparin level Daily heparin level, CBC Monitor for s/sx of bleeding  Onnie Boer, PharmD, BCIDP, AAHIVP, CPP Infectious Disease Pharmacist 01/29/2023 6:47 PM

## 2023-01-29 NOTE — Progress Notes (Signed)
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 Denies itching, dyspnea.  (Adverse, not allergic, reaction   Tricor [Fenofibrate] Other (See Comments)    Leg cramps    Patient Measurements: Height: 5\' 9"  (579FGE cm) Weight: 91.6 kg (202 lb) IBW/kg (Calculated) : 70.7 Heparin Dosing Weight: 90 kg  Vital Signs: Temp: 98 F (36.7 C) (03/22 0414) Temp Source: Oral (03/22 0414) BP: 135/71 (03/22 0414) Pulse Rate: 70 (03/22 0414)  Labs: Recent Labs    01/27/23 1008 01/27/23 1018 01/27/23 2114 01/28/23 0427 01/28/23 0901 01/28/23 1818 01/29/23 0602  HGB 16.3 15.6  --  14.2  --   --  13.7  HCT 45.5 46.0  --  38.7*  --   --  38.4*  PLT 85*  --   --  66*  --   --  61*  APTT 35  --  120*  --  >200* 170* 101*  LABPROT 18.5*  --   --   --   --   --   --   INR 1.6*  --   --   --   --   --   --   HEPARINUNFRC  --   --  >1.10*  --  >1.10*  --  0.55  CREATININE 0.97 1.00  --  0.87  --   --  0.95     Estimated Creatinine Clearance: 87.9 mL/min (by C-G formula based on SCr of 0.95 mg/dL).  Assessment: 67 year old male on apixaban prior to admit for history of DVT/PE. Last dose of apixaban on 3/19. Orders to bridge with IV heparin so that LP can be done on 3/22.  Will monitor aPTT and heparin levels due to recent apixaban usage.   Heparin level is therapeutic at 0.55, aPTT is therapeutic at 101 (upper end of goal range) - appears heparin level and aPTT are beginning to correlate No problems reported by RN No bleeding noted, Hgb stable, platelets are chronically low  Goal of Therapy:  Heparin level  0.3-0.7 units/ml aPTT 66-102 seconds Monitor platelets by anticoagulation protocol: Yes   Plan:  Decrease heparin drip to 850 units/hr 6 hour confirmatory aPTT, heparin level Daily heparin level, aPTT, CBC Monitor for s/sx of bleeding  Thank you for involving pharmacy in this patient's care.  Renold Genta, PharmD, BCPS Clinical Pharmacist Clinical phone for 01/29/2023 is [phone] 01/29/2023 7:40 AM

## 2023-01-29 NOTE — Care Management (Signed)
  Transition of Care [facility]) Screening Note   Patient Details  Name: TRAVUS SACCHETTI Date of Birth: [birth year]   Transition of Care [facility] CM/SW Contact:    Levonne Lapping, RN Phone Number: 01/29/2023, 3:37 PM    Transition of Care Department [facility]) has reviewed patient chart We will continue to monitor patient advancement through interdisciplinary progression rounds. If new patient transition needs arise, please place a TOC consult.

## 2023-01-29 NOTE — Plan of Care (Signed)
Intermittently confused and intermittently oriented appropriately. LP attempted at bedside by neurology team. Unsuccessful attempt. Bowel movement today. Ambulated with assistance to bathroom. Wife remains at bedside.   Problem: Education: Goal: Ability to describe self-care measures that may prevent or decrease complications (Diabetes Survival Skills Education) will improve Outcome: Progressing Goal: Individualized Educational Video(s) Outcome: Progressing   Problem: Coping: Goal: Ability to adjust to condition or change in health will improve Outcome: Progressing   Problem: Fluid Volume: Goal: Ability to maintain a balanced intake and output will improve Outcome: Progressing   Problem: Health Behavior/Discharge Planning: Goal: Ability to identify and utilize available resources and services will improve Outcome: Progressing Goal: Ability to manage health-related needs will improve Outcome: Progressing

## 2023-01-29 NOTE — Progress Notes (Signed)
PROGRESS NOTE        PATIENT DETAILS Name: [name] [name] Age: 67 y.o. Sex: male Date of Birth: 1956/11/06 Admit Date: 01/27/2023 Admitting Physician Orson Eva, MD [id number], Hazle Nordmann, MD  Brief Summary: Patient is a 68 y.o.  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  He was initially seen at [facility] emergency room-and transferred to [facility] for neurology evaluation.  Significant events: 3/20>> admit to [facility]  Significant studies: 3/20>> MRI brain: Small foci of restricted diffusion at the cortical frontoparietal vertex on both sides. 3/20>> CT angio head/neck: No LVO.  25% stenosis of both ICA bulb regions, 50% stenosis of right vertebral artery, 30% stenosis of left vertebral artery.  Significant microbiology data: None  Procedures: None  Consults: Neurology  Subjective:  Headache resolved.  Had hallucinations last night.  Awake/alert this morning.  Answering most of my questions appropriately.  Objective: Vitals: Blood pressure (!) 154/79, pulse 70, temperature 98.3 F (36.8 C), temperature source Oral, resp. rate 14, height 5\' 9"  (1.753 m), weight 91.6 kg, SpO2 98 %.   Exam: Gen Exam:Alert awake-not in any distress HEENT:atraumatic, normocephalic Chest: B/L clear to auscultation anteriorly CVS:S1S2 regular Abdomen:soft non tender, non distended Extremities:no edema Neurology: Non focal Skin: no rash  Pertinent Labs/Radiology:    Latest Ref Rng & Units 01/29/2023    6:02 AM 01/28/2023    4:27 AM 01/27/2023   10:18 AM  CBC  WBC 4.0 - 10.5 K/uL 5.2  4.7    Hemoglobin 13.0 - 17.0 g/dL 13.7  14.2  15.6   Hematocrit 39.0 - 52.0 % 38.4  38.7  46.0   Platelets 150 - 400 K/uL 61  66      Lab Results  Component Value Date   NA 139 01/29/2023   K 3.4 (L) 01/29/2023   CL 110 01/29/2023   CO2 17 (L) 01/29/2023      Assessment/Plan: Intractable headache with metabolic encephalopathy Headache has resolved-after Decadron 10 mg IV on 3/21 Encephalopathy overall improved-but had some hallucinations last night Suspicion for meningoencephalitis-but differentials include vasculitis/autoimmune cerebritis etc. given MRI brain findings. Remains on empiric broad-spectrum antibiotics to cover meningitis LP with CSF analysis planned on 3/22-as last dose of Plavix was almost 48 hours back.    History of migraine headache/complex migraine Headache resolved after a dose of 10 mg Decadron yesterday.   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  CAD No anginal symptoms Continue Crestor/Coreg/Imdur  Chronic HFpEF Euvolemic Continue Farxiga  HLD Statin/Zetia  DM-2 (A1c 7.8 09/29/2021) Monitor CBGs on SSI Continue Farxiga  Recent Labs    01/28/23 1636 01/28/23 2121 01/29/23 0811  GLUCAP 199* 249* 136*     History of NASH liver cirrhosis Relatively compensated NH4 normal at time of admission  Thrombocytopenia Chronic issue Follows with hematology/oncology-unclear whether this is from cirrhosis (per hematology note).  He does have splenomegaly per CT scan February 2023.  Anxiety Celexa  BMI: Estimated body mass index is 29.83 kg/m as calculated from the following:   Height as of this encounter:  5\' 9"  (1.753 m).   Weight as of this encounter: 91.6 kg.   Code status:   Code Status: Full Code   DVT Prophylaxis:IV heparin    Family Communication: Spouse at bedside   Disposition Plan: Status is: Inpatient Remains inpatient appropriate because: Severity of illness   Planned Discharge Destination:Home   Diet: Diet Order             Diet  Carb Modified Fluid consistency: Thin; Room service appropriate? Yes  Diet effective now                     Antimicrobial agents: Anti-infectives (From admission, onward)    Start     Dose/Rate Route Frequency Ordered Stop   01/28/23 0800  vancomycin (VANCOCIN) IVPB 1000 mg/200 mL premix        1,000 mg 200 mL/hr over 60 Minutes Intravenous Every 8 hours 01/27/23 2253     01/28/23 0000  cefTRIAXone (ROCEPHIN) 2 g in sodium chloride 0.9 % 100 mL IVPB        2 g 200 mL/hr over 30 Minutes Intravenous Every 12 hours 01/27/23 2213 02/10/23 2359   01/27/23 2330  vancomycin (VANCOREADY) IVPB 2000 mg/400 mL        2,000 mg 200 mL/hr over 120 Minutes Intravenous  Once 01/27/23 2234 01/28/23 0239   01/27/23 2300  vancomycin (VANCOCIN) IVPB 1000 mg/200 mL premix  Status:  Discontinued        1,000 mg 200 mL/hr over 60 Minutes Intravenous  Once 01/27/23 2213 01/27/23 2234   01/27/23 2300  ampicillin (OMNIPEN) 2 g in sodium chloride 0.9 % 100 mL IVPB        2 g 300 mL/hr over 20 Minutes Intravenous Every 4 hours 01/27/23 2213 02/10/23 2359   01/27/23 1400  cefTRIAXone (ROCEPHIN) 2 g in sodium chloride 0.9 % 100 mL IVPB        2 g 200 mL/hr over 30 Minutes Intravenous  Once 01/27/23 1345 01/27/23 1453        MEDICATIONS: Scheduled Meds:  carvedilol  3.125 mg Oral BID WC   citalopram  20 mg Oral QHS   dapagliflozin propanediol  5 mg Oral QAC breakfast   ezetimibe  10 mg Oral Daily   insulin aspart  0-5 Units Subcutaneous QHS   insulin aspart  0-9 Units Subcutaneous TID WC   isosorbide mononitrate  30 mg Oral QHS   isosorbide mononitrate  60 mg Oral Daily   melatonin  9 mg Oral QHS   mometasone-formoterol  2 puff Inhalation BID   pantoprazole  40 mg Oral Daily   rosuvastatin  20 mg Oral Daily   zonisamide  100 mg Oral Daily   Continuous Infusions:  ampicillin (OMNIPEN) IV 2 g (01/29/23 1121)   cefTRIAXone (ROCEPHIN)  IV Stopped (01/29/23 0019)   heparin 850 Units/hr (01/29/23  0848)   vancomycin 1,000 mg (01/29/23 0459)   PRN Meds:.acetaminophen **OR** acetaminophen, albuterol, HYDROcodone-acetaminophen, HYDROmorphone (DILAUDID) injection, labetalol, naLOXone (NARCAN)  injection, ondansetron **OR** ondansetron (ZOFRAN) IV, mouth rinse   I have personally reviewed following labs and imaging studies  LABORATORY DATA: CBC: Recent Labs  Lab 01/27/23 1008 01/27/23 1018 01/28/23 0427 01/29/23 0602  WBC 5.1  --  4.7 5.2  NEUTROABS 3.7  --   --   --   HGB 16.3 15.6 14.2 13.7  HCT 45.5 46.0 38.7* 38.4*  MCV 87.5  --  87.0 87.7  PLT 85*  --  66* 61*  Basic Metabolic Panel: Recent Labs  Lab 01/27/23 1008 01/27/23 1018 01/28/23 0427 01/29/23 0602  NA 137 142 137 139  K 3.7 3.9 3.2* 3.4*  CL 106 108 109 110  CO2 20*  --  18* 17*  GLUCOSE 155* 155* 103* 160*  BUN 15 15 9 9   CREATININE 0.97 1.00 0.87 0.95  CALCIUM 9.0  --  8.3* 8.6*     GFR: Estimated Creatinine Clearance: 87.9 mL/min (by C-G formula based on SCr of 0.95 mg/dL).  Liver Function Tests: Recent Labs  Lab 01/27/23 1008  AST 33  ALT 26  ALKPHOS 52  BILITOT 1.3*  PROT 7.5  ALBUMIN 3.7    No results for input(s): "LIPASE", "AMYLASE" in the last 168 hours. Recent Labs  Lab 01/27/23 1052  AMMONIA 18     Coagulation Profile: Recent Labs  Lab 01/27/23 1008  INR 1.6*     Cardiac Enzymes: No results for input(s): "CKTOTAL", "CKMB", "CKMBINDEX", "TROPONINI" in the last 168 hours.  BNP (last 3 results) No results for input(s): "PROBNP" in the last 8760 hours.  Lipid Profile: No results for input(s): "CHOL", "HDL", "LDLCALC", "TRIG", "CHOLHDL", "LDLDIRECT" in the last 72 hours.  Thyroid Function Tests: Recent Labs    01/27/23 1008  TSH 1.077     Anemia Panel: Recent Labs    01/27/23 1008  VITAMINB12 326  FOLATE 8.3     Urine analysis:    Component Value Date/Time   COLORURINE YELLOW 01/27/2023 1316   APPEARANCEUR CLEAR 01/27/2023 1316   LABSPEC  >1.046 (H) 01/27/2023 1316   PHURINE 6.0 01/27/2023 1316   GLUCOSEU NEGATIVE 01/27/2023 1316   HGBUR NEGATIVE 01/27/2023 1316   BILIRUBINUR NEGATIVE 01/27/2023 1316   KETONESUR 5 (A) 01/27/2023 1316   PROTEINUR 30 (A) 01/27/2023 1316   UROBILINOGEN 0.2 12/04/2013 0730   NITRITE NEGATIVE 01/27/2023 1316   LEUKOCYTESUR NEGATIVE 01/27/2023 1316    Sepsis Labs: Lactic Acid, Venous    Component Value Date/Time   LATICACIDVEN 1.7 09/03/2021 1901    MICROBIOLOGY: Recent Results (from the past 240 hour(s))  Urine Culture     Status: None   Collection Time: 01/27/23  1:16 PM   Specimen: Urine, Clean Catch  Result Value Ref Range Status   Specimen Description   Final    URINE, CLEAN CATCH Performed at [facility], [address], [place], [place] [address]    Special Requests   Final    NONE Performed at [facility], [address], [place], [place] [address]    Culture   Final    NO GROWTH Performed at [facility], [place] [address], [place], [place] [address]    Report Status 01/29/2023 FINAL  Final    RADIOLOGY STUDIES/RESULTS: MR Venogram Head  Result Date: 01/28/2023 CLINICAL DATA:  History of migraines and prior stroke presents with acute onset confusion and headache. EXAM: MR VENOGRAM HEAD WITHOUT CONTRAST TECHNIQUE: Angiographic images of the intracranial venous structures were acquired using MRV technique without intravenous contrast. COMPARISON:  CTA head 1 day prior FINDINGS: There is no evidence of dural venous sinus or deep cerebral vein thrombosis. No dural venous sinus stenosis. IMPRESSION: Normal noncontrast MRV head with no evidence of venous sinus thrombosis. Electronically Signed   By: Valetta Mole M.D.   On: 01/28/2023 14:07     LOS: 2 days   Oren Binet, MD  [facility]    To contact the attending provider between 7A-7P or the covering provider during after hours  7P-7A, please log into the web site [email or web address] and access using universal  [place] password for that web site. If you do not have the password, please call the hospital operator.  01/29/2023, 11:36 AM

## 2023-01-29 NOTE — Progress Notes (Signed)
Neurology Progress Note   S:// Patient seen and examined.  Continues to report of persistent headache. Started on meningitic coverage antibiotics overnight due to persistent headache. He states that his headache improved and then came back. His wife states he seems 75% back to normal, but that his speech and speech pattern are still abnormal.   LP attempted today, will need dg fluoro LP.   O:// Current vital signs: BP (!) 154/79 (BP Location: Left Arm)   Pulse 70   Temp 98.3 F (36.8 C) (Oral)   Resp 14   Ht 5\' 9"  (1.753 m)   Wt 91.6 kg   SpO2 98%   BMI 29.83 kg/m  Vital signs in last 24 hours: Temp:  [97.8 F (36.6 C)-98.3 F (36.8 C)] 98.3 F (36.8 C) (03/22 0813) Pulse Rate:  [70-80] 70 (03/22 0414) Resp:  [14-17] 14 (03/22 0813) BP: (126-154)/(69-79) 154/79 (03/22 0813) SpO2:  [97 %-99 %] 98 % (03/22 0729)  General awake alert in no distress HEENT: Normocephalic atraumatic CVs: Regular rhythm Neurological exam Awake alert oriented x 3 No dysarthria No aphasia Mildly reduced attention concentration Cranials 2-12 intact Motor examination with no drift Sensation intact to light touch Coordination exam with no dysmetria  Medications  Current Facility-Administered Medications:    acetaminophen (TYLENOL) tablet 650 mg, 650 mg, Oral, Q6H PRN, 650 mg at 01/27/23 2023 **OR** acetaminophen (TYLENOL) suppository 650 mg, 650 mg, Rectal, Q6H PRN, Tat, David, MD   albuterol (PROVENTIL) (2.5 MG/3ML) 0.083% nebulizer solution 2.5 mg, 2.5 mg, Nebulization, Q6H PRN, Tat, David, MD   ampicillin (OMNIPEN) 2 g in sodium chloride 0.9 % 100 mL IVPB, 2 g, Intravenous, Q4H, Greta Doom, MD, Last Rate: 300 mL/hr at 01/29/23 0856, 2 g at 01/29/23 0856   carvedilol (COREG) tablet 3.125 mg, 3.125 mg, Oral, BID WC, Tat, David, MD, 3.125 mg at 01/29/23 0851   cefTRIAXone (ROCEPHIN) 2 g in sodium chloride 0.9 % 100 mL IVPB, 2 g, Intravenous, Q12H, Greta Doom, MD, Stopped  at 01/29/23 0019   citalopram (CELEXA) tablet 20 mg, 20 mg, Oral, QHS, Tat, Shanon Brow, MD, 20 mg at 01/28/23 2140   dapagliflozin propanediol (FARXIGA) tablet 5 mg, 5 mg, Oral, QAC breakfast, Tat, David, MD, 5 mg at 01/28/23 1417   ezetimibe (ZETIA) tablet 10 mg, 10 mg, Oral, Daily, Tat, David, MD, 10 mg at 01/28/23 0930   heparin ADULT infusion 100 units/mL (25000 units/273mL), 850 Units/hr, Intravenous, Continuous, Alvira Philips, [place], Last Rate: 8.5 mL/hr at 01/29/23 0848, 850 Units/hr at 01/29/23 0848   HYDROcodone-acetaminophen (NORCO/VICODIN) 5-325 MG per tablet 1 tablet, 1 tablet, Oral, Q6H PRN, Tat, David, MD, 1 tablet at 01/27/23 1648   HYDROmorphone (DILAUDID) injection 0.5 mg, 0.5 mg, Intravenous, Q2H PRN, Howerter, Justin B, DO, 0.5 mg at 01/28/23 1950   insulin aspart (novoLOG) injection 0-5 Units, 0-5 Units, Subcutaneous, QHS, Tat, David, MD, 2 Units at 01/28/23 2140   insulin aspart (novoLOG) injection 0-9 Units, 0-9 Units, Subcutaneous, TID WC, Tat, David, MD, 1 Units at 01/29/23 0849   isosorbide mononitrate (IMDUR) 24 hr tablet 30 mg, 30 mg, Oral, QHS, Tat, David, MD, 30 mg at 01/28/23 2140   isosorbide mononitrate (IMDUR) 24 hr tablet 60 mg, 60 mg, Oral, Daily, Tat, David, MD, 60 mg at 01/28/23 0930   labetalol (NORMODYNE) injection 10 mg, 10 mg, Intravenous, Q2H PRN, Howerter, Justin B, DO   melatonin tablet 9 mg, 9 mg, Oral, QHS, Tat, Shanon Brow, MD, 9 mg at 01/28/23 2140  mometasone-formoterol (DULERA) 200-5 MCG/ACT inhaler 2 puff, 2 puff, Inhalation, BID, Tat, David, MD, 2 puff at 01/29/23 0728   naloxone [facility]) injection 0.4 mg, 0.4 mg, Intravenous, PRN, Howerter, Justin B, DO   ondansetron (ZOFRAN) tablet 4 mg, 4 mg, Oral, Q6H PRN **OR** ondansetron (ZOFRAN) injection 4 mg, 4 mg, Intravenous, Q6H PRN, Tat, David, MD, 4 mg at 01/29/23 [id number]   Oral care mouth rinse, 15 mL, Mouth Rinse, PRN, Tat, David, MD   pantoprazole (PROTONIX) EC tablet 40 mg, 40 mg, Oral, Daily, Tat, David,  MD, 40 mg at 01/28/23 0930   rosuvastatin (CRESTOR) tablet 20 mg, 20 mg, Oral, Daily, Tat, David, MD, 20 mg at 01/28/23 0931   vancomycin (VANCOCIN) IVPB 1000 mg/200 mL premix, 1,000 mg, Intravenous, Q8H, Kaleen Mask, RPH, Last Rate: 200 mL/hr at 01/29/23 0459, 1,000 mg at 01/29/23 0459   zonisamide (ZONEGRAN) capsule 100 mg, 100 mg, Oral, Daily, Tat, David, MD, 100 mg at 01/28/23 0931 Labs CBC    Component Value Date/Time   WBC 5.2 01/29/2023 0602   RBC 4.38 01/29/2023 0602   HGB 13.7 01/29/2023 0602   HGB 14.9 09/29/2022 1607   HCT 38.4 (L) 01/29/2023 0602   HCT 43.6 09/29/2022 1607   PLT 61 (L) 01/29/2023 0602   PLT 99 (LL) 09/29/2022 1607   MCV 87.7 01/29/2023 0602   MCV 94 09/29/2022 1607   MCH 31.3 01/29/2023 0602   MCHC 35.7 01/29/2023 0602   RDW 14.1 01/29/2023 0602   RDW 13.6 09/29/2022 1607   LYMPHSABS 0.9 01/27/2023 1008   LYMPHSABS 1.0 09/29/2022 1607   MONOABS 0.4 01/27/2023 1008   EOSABS 0.1 01/27/2023 1008   EOSABS 0.1 09/29/2022 1607   BASOSABS 0.0 01/27/2023 1008   BASOSABS 0.0 09/29/2022 1607    CMP     Component Value Date/Time   NA 139 01/29/2023 0602   NA 140 09/29/2022 1607   K 3.4 (L) 01/29/2023 0602   CL 110 01/29/2023 0602   CO2 17 (L) 01/29/2023 0602   GLUCOSE 160 (H) 01/29/2023 0602   BUN 9 01/29/2023 0602   BUN 16 09/29/2022 1607   CREATININE 0.95 01/29/2023 0602   CREATININE 1.04 08/15/2020 1451   CALCIUM 8.6 (L) 01/29/2023 0602   PROT 7.5 01/27/2023 1008   PROT 7.2 09/29/2022 1607   ALBUMIN 3.7 01/27/2023 1008   ALBUMIN 3.7 (L) 09/29/2022 1607   AST 33 01/27/2023 1008   ALT 26 01/27/2023 1008   ALKPHOS 52 01/27/2023 1008   BILITOT 1.3 (H) 01/27/2023 1008   BILITOT 0.4 09/29/2022 1607   GFRNONAA >60 01/29/2023 0602   GFRAA >60 07/27/2020 0525     Lipid Panel     Component Value Date/Time   CHOL 108 09/29/2022 1607   TRIG 112 09/29/2022 1607   HDL 32 (L) 09/29/2022 1607   CHOLHDL 3.4 09/29/2022 1607   CHOLHDL 6.8  07/28/2021 0531   VLDL 41 (H) 07/28/2021 0531   LDLCALC 55 09/29/2022 1607     Imaging I have reviewed images in epic and the results pertinent to this consultation are: MRI of the brain done yesterday shows small foci of restricted diffusion in the cortical frontoparietal vertex in both sides, slightly more prominent on the right than the left.  There is no large confluent infarction.  Findings unusual for central origin microembolic infarctions-differentials include atypical watershed insults, meningitis, autoimmune cerebritis, lymphoma.  MRV: Normal noncontrast MRV head with no evidence of venous sinus thrombosis.  Assessment:  67 year old man  with past medical history of migraines, prior stroke with no residual deficits, anxiety with acute onset of confusion, not feeling right and severe unremitting headache with imaging by MRI showing small cortically based foci of restricted diffusion at the frontoparietal vertex in both size with broad differentials including that of vasculitis, autoimmune cerebritis, infectious meningitis and atypical CJD along with atypical watershed infarctions and CNS lymphoma. MRV normal. Currently on heparin drip for LP. LP to be done with fluoroscopy.  Impression: Persistent headache, altered mental status-broad differential as above  Recommendations: LP-send CSF for glucose, protein, cell count, CSF culture, Gram stain, meningitis/encephalitis panel, cytology, flow cytometry Symptomatic management of headache per primary team as you are   -- Patient seen and examined by NP/APP with MD. MD to update note as needed.   Janine Ores, DNP, FNP-BC [facility] Pager: [phone]  Attending Neurohospitalist Addendum Patient seen and examined with APP/Resident. Agree with the history and physical as documented above. Agree with the plan as documented, which I helped formulate. I have independently reviewed the chart, obtained history, review of  systems and examined the patient.I have personally reviewed pertinent head/neck/spine imaging (CT/MRI). Please feel free to call with any questions.  -- Amie Portland, MD Neurologist [facility] Pager: [phone]

## 2023-01-29 NOTE — Procedures (Signed)
LUMBAR PUNCTURE (SPINAL TAP) PROCEDURE NOTE  Indication: Meningitis, abnormal MRI brain   Proceduralists: Dr Rory Percy, Janine Ores NP   Risks, benefits and alternatives of the procedure were dicussed with the patient including but not limited to post-LP headache, bleeding, infection, weakness/numbness of legs(radiculopathy), death.    Consent obtained from: wife   Procedure Note The patient was prepped and draped, and using sterile technique a 20 gauge quinke spinal needle was inserted in the L4-5 space.  No return despite 6 attempts.  Procedure aborted. Will request fluoro guided LP   -- Amie Portland, MD Neurologist [facility] Pager: [phone]

## 2023-01-29 NOTE — Progress Notes (Signed)
Pt endorsing hallucinations. Stated he can see hanging baskets in his room and that the curtain looks like a sliding door. Also c/o nausea. Pt has also not slept tonight. PRN zofran given for nausea. On call provider notified.

## 2023-01-30 ENCOUNTER — Inpatient Hospital Stay (HOSPITAL_COMMUNITY): Payer: 59

## 2023-01-30 DIAGNOSIS — G9341 Metabolic encephalopathy: Secondary | ICD-10-CM | POA: Diagnosis not present

## 2023-01-30 LAB — MENINGITIS/ENCEPHALITIS PANEL (CSF)

## 2023-01-30 LAB — CBC
HCT: 36.9 % — ABNORMAL LOW (ref 39.0–52.0)
Hemoglobin: 12.9 g/dL — ABNORMAL LOW (ref 13.0–17.0)
MCH: 31.3 pg (ref 26.0–34.0)
MCHC: 35 g/dL (ref 30.0–36.0)
MCV: 89.6 fL (ref 80.0–100.0)
Platelets: 54 10*3/uL — ABNORMAL LOW (ref 150–400)
RBC: 4.12 MIL/uL — ABNORMAL LOW (ref 4.22–5.81)
RDW: 14.6 % (ref 11.5–15.5)
WBC: 2.9 10*3/uL — ABNORMAL LOW (ref 4.0–10.5)
nRBC: 0 % (ref 0.0–0.2)

## 2023-01-30 LAB — GLUCOSE, CAPILLARY
Glucose-Capillary: 108 mg/dL — ABNORMAL HIGH (ref 70–99)
Glucose-Capillary: 108 mg/dL — ABNORMAL HIGH (ref 70–99)
Glucose-Capillary: 170 mg/dL — ABNORMAL HIGH (ref 70–99)
Glucose-Capillary: 152 mg/dL — ABNORMAL HIGH (ref 70–99)
Glucose-Capillary: 132 mg/dL — ABNORMAL HIGH (ref 70–99)

## 2023-01-30 LAB — PROCALCITONIN: Procalcitonin: <0.1 ng/mL

## 2023-01-30 LAB — CSF CELL COUNT WITH DIFFERENTIAL
RBC Count, CSF: 3 /mm3 — ABNORMAL HIGH
Tube #: 1
WBC, CSF: 1 /mm3 (ref 0–5)

## 2023-01-30 LAB — VANCOMYCIN, TROUGH: Vancomycin Tr: 24 ug/mL (ref 15–20)

## 2023-01-30 LAB — MAGNESIUM: Magnesium: 1.6 mg/dL — ABNORMAL LOW (ref 1.7–2.4)

## 2023-01-30 LAB — APTT: aPTT: 83 seconds — ABNORMAL HIGH (ref 24–36)

## 2023-01-30 LAB — HEPARIN LEVEL (UNFRACTIONATED): Heparin Unfractionated: 0.31 IU/mL (ref 0.30–0.70)

## 2023-01-30 LAB — VANCOMYCIN, PEAK: Vancomycin Pk: 40 ug/mL (ref 30–40)

## 2023-01-30 LAB — BRAIN NATRIURETIC PEPTIDE: B Natriuretic Peptide: 119.2 pg/mL — ABNORMAL HIGH (ref 0.0–100.0)

## 2023-01-30 LAB — PROTEIN AND GLUCOSE, CSF
Glucose, CSF: 55 mg/dL (ref 40–70)
Total  Protein, CSF: 58 mg/dL — ABNORMAL HIGH (ref 15–45)

## 2023-01-30 LAB — C-REACTIVE PROTEIN: CRP: <0.5 mg/dL (ref <1.0)

## 2023-01-30 MED ORDER — MAGNESIUM SULFATE 4 GM/100ML IV SOLN
4.0000 g | Freq: Once | INTRAVENOUS | Status: AC
  Administered 2023-01-30: 4 g via INTRAVENOUS
  Filled 2023-01-30: qty 100

## 2023-01-30 MED ORDER — LIDOCAINE HCL (PF) 1 % IJ SOLN
INTRAMUSCULAR | Status: AC
  Administered 2023-01-30: 5 mL via INTRAMUSCULAR
  Filled 2023-01-30: qty 5

## 2023-01-30 MED ORDER — HEPARIN (PORCINE) 25000 UT/250ML-% IV SOLN
1050.0000 [IU]/h | INTRAVENOUS | Status: AC
  Administered 2023-01-30: 900 [IU]/h via INTRAVENOUS
  Filled 2023-01-30: qty 250

## 2023-01-30 MED ORDER — VANCOMYCIN HCL 750 MG/150ML IV SOLN
750.0000 mg | Freq: Three times a day (TID) | INTRAVENOUS | Status: DC
  Administered 2023-01-30 – 2023-01-31 (×2): 750 mg via INTRAVENOUS
  Filled 2023-01-30 (×3): qty 150

## 2023-01-30 NOTE — Progress Notes (Addendum)
PROGRESS NOTE        PATIENT DETAILS Name: [name] [name] Age: 67 y.o. Sex: male Date of Birth: 1956/05/08 Admit Date: 01/27/2023 Admitting Physician Orson Eva, MD [id number], Hazle Nordmann, MD  Brief Summary: Patient is a 66 y.o.  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  He was initially seen at [facility] emergency room-and transferred to [facility] for neurology evaluation.  Significant events: 3/20>> admit to [facility]  Significant studies: 3/20>> MRI brain: Small foci of restricted diffusion at the cortical frontoparietal vertex on both sides. 3/20>> CT angio head/neck: No LVO.  25% stenosis of both ICA bulb regions, 50% stenosis of right vertebral artery, 30% stenosis of left vertebral artery.  Significant microbiology data: None  Procedures: None  Consults: Neurology   Subjective: Patient in bed, appears comfortable, denies any headache but still has some photophobia, no fever, no chest pain or pressure, no shortness of breath , no abdominal pain. No new focal weakness.   Objective: Vitals: Blood pressure 137/75, pulse 67, temperature 97.6 F (36.4 C), temperature source Oral, resp. rate 13, height 5\' 9"  (1.753 m), weight 91.6 kg, SpO2 97 %.   Exam:  Awake Alert, O x 3, No new F.N deficits, Normal affect [place].AT,PERRAL Supple Neck, No JVD,   Symmetrical Chest wall movement, Good air movement bilaterally, CTAB RRR,No Gallops, Rubs or new Murmurs,  +ve B.Sounds, Abd Soft, No tenderness,   No Cyanosis, Clubbing or edema   Assessment/Plan:  Intractable headache with metabolic encephalopathy Headache has resolved-after Decadron 10 mg IV on 3/21 Encephalopathy overall improved-but had some hallucinations last night Suspicion for meningoencephalitis-but differentials include vasculitis/autoimmune cerebritis etc. given MRI brain  findings. Remains on empiric broad-spectrum antibiotics to cover meningitis After Plavix washout LP was attempted bedside 01/29/2023 it failed neurology now planning to repeat on 01/30/2023.  Clinically headache has improved but still has photophobia, will follow CSF studies and culture results.  History of migraine headache/complex migraine Headache resolved after a dose of 10 mg Decadron yesterday.   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  CAD No anginal symptoms Continue Crestor/Coreg/Imdur  Chronic HFpEF Euvolemic Continue Farxiga  HLD Statin/Zetia  History of NASH liver cirrhosis Relatively compensated NH4 normal at time of admission  Thrombocytopenia Chronic issue Follows with hematology/oncology-unclear whether this is from cirrhosis (per hematology note).  He does have splenomegaly per CT scan February 2023.  Anxiety Celexa  DM-2 (A1c 7.8 09/29/2021) Monitor CBGs on SSI Continue Farxiga  Recent Labs    01/29/23 1610 01/29/23 2026 01/30/23 0756  GLUCAP 208* 147* 108*      BMI: Estimated body mass index is 29.83 kg/m as calculated from the following:   Height as of this encounter: 5\' 9"  (1.753 m).   Weight as of this encounter: 91.6 kg.   Code status:   Code Status: Full Code   DVT Prophylaxis:IV heparin    Family Communication: Spouse at bedside 01/30/23   Disposition Plan: Status is: Inpatient Remains inpatient appropriate because: Severity of illness   Planned Discharge Destination:Home  Diet: Diet Order             Diet Carb Modified Fluid consistency: Thin; Room service appropriate? Yes  Diet effective now                   MEDICATIONS: Scheduled Meds:  carvedilol   3.125 mg Oral BID WC   citalopram  20 mg Oral QHS   dapagliflozin propanediol  5 mg Oral QAC breakfast   ezetimibe  10 mg Oral Daily   insulin aspart  0-5 Units Subcutaneous QHS   insulin aspart  0-9 Units Subcutaneous TID WC   isosorbide mononitrate  30 mg Oral QHS   isosorbide mononitrate  60 mg Oral Daily   melatonin  9 mg Oral QHS   mometasone-formoterol  2 puff Inhalation BID   pantoprazole  40 mg Oral Daily   rosuvastatin  20 mg Oral Daily   zonisamide  100 mg Oral Daily   Continuous Infusions:  ampicillin (OMNIPEN) IV 2 g (01/30/23 0811)   cefTRIAXone (ROCEPHIN)  IV 2 g (01/29/23 2357)   heparin 900 Units/hr (01/29/23 1933)   magnesium sulfate bolus IVPB 4 g (01/30/23 0950)   vancomycin 1,000 mg (01/30/23 0549)   PRN Meds:.acetaminophen **OR** acetaminophen, albuterol, HYDROcodone-acetaminophen, HYDROmorphone (DILAUDID) injection, labetalol, naLOXone (NARCAN)  injection, ondansetron **OR** ondansetron (ZOFRAN) IV, mouth rinse   I have personally reviewed following labs and imaging studies  LABORATORY DATA:  Recent Labs  Lab 01/27/23 1008 01/27/23 1018 01/28/23 0427 01/29/23 0602 01/30/23 0622  WBC 5.1  --  4.7 5.2 2.9*  HGB 16.3 15.6 14.2 13.7 12.9*  HCT 45.5 46.0 38.7* 38.4* 36.9*  PLT 85*  --  66* 61* 54*  MCV 87.5  --  87.0 87.7 89.6  MCH 31.3  --  31.9 31.3 31.3  MCHC 35.8  --  36.7* 35.7 35.0  RDW 14.7  --  14.6 14.1 14.6  LYMPHSABS 0.9  --   --   --   --   MONOABS 0.4  --   --   --   --   EOSABS 0.1  --   --   --   --   BASOSABS 0.0  --   --   --   --     Recent Labs  Lab 01/27/23 1008 01/27/23 1018 01/27/23 1052 01/28/23 0427 01/29/23 0602 01/30/23 0622  NA 137 142  --  137 139  --   K 3.7 3.9  --  3.2* 3.4*  --   CL 106 108  --  109 110  --   CO2 20*  --   --  18* 17*  --   ANIONGAP 11  --   --  10 12  --   GLUCOSE 155* 155*  --  103* 160*  --   BUN 15 15  --  9 9  --   CREATININE 0.97 1.00  --  0.87 0.95  --   AST 33  --   --   --   --    --   ALT 26  --   --   --   --   --   ALKPHOS 52  --   --   --   --   --   BILITOT 1.3*  --   --   --   --   --   ALBUMIN 3.7  --   --   --   --   --   CRP  --   --   --   --   --  <  0.5  PROCALCITON  --   --   --   --   --  <0.10  INR 1.6*  --   --   --   --   --   TSH 1.077  --   --   --   --   --   HGBA1C 5.9*  --   --   --   --   --   AMMONIA  --   --  18  --   --   --   MG  --   --   --   --   --  1.6*  CALCIUM 9.0  --   --  8.3* 8.6*  --       Recent Labs  Lab 01/27/23 1008 01/27/23 1052 01/28/23 0427 01/29/23 0602 01/30/23 0622  CRP  --   --   --   --  <0.5  PROCALCITON  --   --   --   --  <0.10  INR 1.6*  --   --   --   --   TSH 1.077  --   --   --   --   HGBA1C 5.9*  --   --   --   --   AMMONIA  --  18  --   --   --   MG  --   --   --   --  1.6*  CALCIUM 9.0  --  8.3* 8.6*  --      RADIOLOGY STUDIES/RESULTS: DG Chest Port 1 View  Result Date: 01/30/2023 CLINICAL DATA:  Shortness of breath. EXAM: PORTABLE CHEST 1 VIEW COMPARISON:  01/17/2023 FINDINGS: Low volume film. Asymmetric elevation right hemidiaphragm. The lungs are clear without focal pneumonia, edema, pneumothorax or pleural effusion. The cardiopericardial silhouette is within normal limits for size. The visualized bony structures of the thorax are unremarkable. IMPRESSION: Low volume film without acute cardiopulmonary findings. Electronically Signed   By: Misty Stanley M.D.   On: 01/30/2023 08:37   MR Venogram Head  Result Date: 01/28/2023 CLINICAL DATA:  History of migraines and prior stroke presents with acute onset confusion and headache. EXAM: MR VENOGRAM HEAD WITHOUT CONTRAST TECHNIQUE: Angiographic images of the intracranial venous structures were acquired using MRV technique without intravenous contrast. COMPARISON:  CTA head 1 day prior FINDINGS: There is no evidence of dural venous sinus or deep cerebral vein thrombosis. No dural venous sinus stenosis. IMPRESSION: Normal noncontrast MRV head with no  evidence of venous sinus thrombosis. Electronically Signed   By: Valetta Mole M.D.   On: 01/28/2023 14:07     LOS: 3 days   Signature  -    Lala Lund M.D on 01/30/2023 at 10:32 AM   -  To page go to [email or web address]

## 2023-01-30 NOTE — Progress Notes (Signed)
Per IR, okay for patient to have LP one hour after stopping heparin drip. IR to send for patient again shortly.

## 2023-01-30 NOTE — Evaluation (Signed)
Occupational Therapy Evaluation Patient Details Name: [name] [name] MRN: [id number] DOB: July 06, 1956 Today's Date: 01/30/2023   History of Present Illness Patient is a 67 y.o.  male who presented with 1 day history of confusion and headaches.  MRI showing small cortically based foci of restricted diffusion at the frontoparietal vertex in both size with broad differentials including that of vasculitis, autoimmune cerebritis, infectious meningitis and atypical CJD along with atypical watershed infarctions and CNS lymphoma. LP pending. Demonstrated. 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 He is indep at baseline. Upon evaluation he was limited by decreased activity tolerance, fatigue, slowed processing and limited attention. Overall he required up to min G for mobility and up to min A for ADLs with cues throughout. Pt will benefit from continued acute OT services. Recommend d/c to home with support of family as needed.       Recommendations for follow up therapy are one component of a multi-disciplinary discharge planning process, led by the attending physician.  Recommendations may be updated based on patient status, additional functional criteria and insurance authorization.   Follow Up Recommendations  No OT follow up     Assistance Recommended at Discharge Intermittent Supervision/Assistance  Patient can return home with the following A little help with walking and/or transfers;A little help with bathing/dressing/bathroom;Assistance with cooking/housework;Assist for transportation;Help with stairs or ramp for entrance    Functional Status Assessment  Patient has had a recent decline in their functional status and demonstrates the ability to make significant improvements in function in a reasonable and predictable amount of time.  Equipment Recommendations  None  recommended by OT       Precautions / Restrictions Precautions Precautions: Fall Restrictions Weight Bearing Restrictions: No      Mobility Bed Mobility Overal bed mobility: Needs Assistance Bed Mobility: Supine to Sit, Sit to Supine     Supine to sit: Supervision Sit to supine: Supervision        Transfers Overall transfer level: Needs assistance Equipment used: None Transfers: Sit to/from Stand Sit to Stand: Min guard                  Balance Overall balance assessment: Needs assistance Sitting-balance support: Feet supported Sitting balance-Leahy Scale: Good     Standing balance support: No upper extremity supported, During functional activity Standing balance-Leahy Scale: Fair                             ADL either performed or assessed with clinical judgement   ADL Overall ADL's : Needs assistance/impaired Eating/Feeding: Independent;Sitting   Grooming: Supervision/safety;Standing Grooming Details (indicate cue type and reason): at the sink Upper Body Bathing: Set up;Sitting   Lower Body Bathing: Minimal assistance;Sit to/from stand   Upper Body Dressing : Set up;Sitting   Lower Body Dressing: Minimal assistance;Sit to/from stand   Toilet Transfer: Min guard;Ambulation   Toileting- Clothing Manipulation and Hygiene: Supervision/safety;Sitting/lateral lean       Functional mobility during ADLs: Min guard General ADL Comments: cues for safety, attention and problem solving     Vision Baseline Vision/History: 0 No visual deficits Vision Assessment?: No apparent visual deficits     Perception Perception Perception Tested?: No   Praxis Praxis Praxis tested?: Not tested    Pertinent Vitals/Pain Pain Assessment Pain Assessment: No/denies pain  Hand Dominance Right   Extremity/Trunk Assessment Upper Extremity Assessment Upper Extremity Assessment: Generalized weakness   Lower Extremity Assessment Lower Extremity  Assessment: Generalized weakness   Cervical / Trunk Assessment Cervical / Trunk Assessment: Normal   Communication Communication Communication: No difficulties   Cognition Arousal/Alertness: Awake/alert Behavior During Therapy: WFL for tasks assessed/performed Overall Cognitive Status: Impaired/Different from baseline Area of Impairment: Attention, Problem solving                   Current Attention Level: Selective         Problem Solving: Slow processing General Comments: A&Ox4. Poor attention, requires cues. Pt self-distracting jokes. Pt's wife present and reported this is not his baseline cognition (normally quick and witty)     General Comments  VSS on RA, wife present    Exercises     Shoulder Instructions      Home Living Family/patient expects to be discharged to:: Private residence Living Arrangements: Spouse/significant other Available Help at Discharge: Family;Available 24 hours/day Type of Home: House Home Access: Level entry     Home Layout: One level     Bathroom Shower/Tub: Teacher, early years/pre: Standard Bathroom Accessibility: Yes How Accessible: Accessible via walker Home Equipment: [place] (2 wheels);Cane - single point;Grab bars - tub/shower          Prior Functioning/Environment Prior Level of Function : Independent/Modified Independent;Driving             Mobility Comments: Community ambulator without AD, drives ADLs Comments: Independent        OT Problem List: Decreased strength;Decreased range of motion;Decreased activity tolerance;Decreased cognition;Decreased safety awareness      OT Treatment/Interventions: Self-care/ADL training;Therapeutic exercise;DME and/or AE instruction;Therapeutic activities;Patient/family education;Balance training    OT Goals(Current goals can be found in the care plan section) Acute Rehab OT Goals Patient Stated Goal: Home OT Goal Formulation: With patient Time For  Goal Achievement: 02/13/23 Potential to Achieve Goals: Good ADL Goals Additional ADL Goal #1: Pt will complete BADLs with mod I Additional ADL Goal #2: Pt will indep complete IADL medication management task  OT Frequency: Min 2X/week       AM-PAC OT "6 Clicks" Daily Activity     Outcome Measure Help from another person eating meals?: None Help from another person taking care of personal grooming?: A Little Help from another person toileting, which includes using toliet, bedpan, or urinal?: A Little Help from another person bathing (including washing, rinsing, drying)?: A Little Help from another person to put on and taking off regular upper body clothing?: None Help from another person to put on and taking off regular lower body clothing?: A Little 6 Click Score: 20   End of Session Equipment Utilized During Treatment: Gait belt Nurse Communication: Mobility status  Activity Tolerance: Patient tolerated treatment well Patient left: in bed;with call bell/phone within reach;with bed alarm set;with family/visitor present  OT Visit Diagnosis: Unsteadiness on feet (R26.81);Other abnormalities of gait and mobility (R26.89);Muscle weakness (generalized) (M62.81)                Time: [id number] OT Time Calculation (min): 18 min Charges:  OT General Charges $OT Visit: 1 Visit OT Evaluation $OT Eval Moderate Complexity: 1 Mod  Shade Flood, OTR/L Acute Rehabilitation Services Office [place] Communication Preferred   Elliot Cousin 01/30/2023, 12:16 PM

## 2023-01-30 NOTE — Progress Notes (Signed)
SLP Cancellation Note  Patient Details Name: [name] [name] MRN: [id number] DOB: January 07, 1956   Cancelled treatment:       Reason Eval/Treat Not Completed: Patient not medically ready;Other (comment) (Pt recently had lumbar procedure and needs to remain supine for the next few hours per nursing report)   Canary Brim M.S. CF-SLP

## 2023-01-30 NOTE — Progress Notes (Signed)
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 Denies itching, dyspnea.  (Adverse, not allergic, reaction   Tricor [Fenofibrate] Other (See Comments)    Leg cramps    Patient Measurements: Height: 5\' 9"  (175.3 cm) Weight: 91.6 kg (202 lb) IBW/kg (Calculated) : 70.7 Heparin Dosing Weight: 90 kg  Vital Signs: Temp: 97.9 F (36.6 C) (03/23 0445) Temp Source: Oral (03/23 0445) BP: 135/73 (03/23 0445) Pulse Rate: 69 (03/23 0445)  Labs: Recent Labs    01/27/23 1008 01/27/23 1018 01/27/23 2114 01/28/23 0427 01/28/23 0901 01/28/23 1818 01/29/23 0602  HGB 16.3 15.6  --  14.2  --   --  13.7  HCT 45.5 46.0  --  38.7*  --   --  38.4*  PLT 85*  --   --  66*  --   --  61*  APTT 35  --  120*  --  >200* 170* 101*  LABPROT 18.5*  --   --   --   --   --   --   INR 1.6*  --   --   --   --   --   --   HEPARINUNFRC  --   --  >1.10*  --  >1.10*  --  0.55  CREATININE 0.97 1.00  --  0.87  --   --  0.95     Estimated Creatinine Clearance: 87.9 mL/min (by C-G formula based on SCr of 0.95 mg/dL).  Assessment: 67 year old male on apixaban prior to admit for history of DVT/PE. Last dose of apixaban on 3/19. Orders to bridge with IV heparin so that LP can be done on 3/22.  Will monitor aPTT and heparin levels due to recent apixaban usage.   Heparin level is therapeutic at 0.31, aPTT is therapeutic at 83 - appears heparin level and aPTT are correlating, will proceed with heparin levels only  No problems reported by RN No bleeding noted, Hgb stable, platelets are chronically low  Failed bedside LP 3/22. D/w Dr  Rory Percy and we will resume heparin 2 hr post LP attempts. Will follow along for IR interventions.   Goal of Therapy:  Heparin level 0.3-0.7 units/ml aPTT 66-102 seconds Monitor platelets by anticoagulation protocol: Yes   Plan:  Continue heparin drip at 900 units/hr Continue daily heparin levels Daily heparin level, CBC Monitor for s/sx of bleeding  Vicenta Dunning, PharmD  PGY1 Pharmacy Resident

## 2023-01-30 NOTE — Evaluation (Signed)
Physical Therapy Evaluation Patient Details Name: [name] [name] MRN: [id number] DOB: 11-04-1956 Today's Date: 01/30/2023  History of Present Illness  Patient is a 67 y.o.  male who presented on 3/20 with 1 day history of confusion and headaches.  MRI showing small cortically based foci of restricted diffusion at the frontoparietal vertex in both size with broad differentials including that of vasculitis, autoimmune cerebritis, infectious meningitis and atypical CJD along with atypical watershed infarctions and CNS lymphoma. LP pending. Demonstrated. 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 Pt is independent with all mobility at baseline, requiring minG for all mobility today for safety and balance. Wife reports pt's cognition is improving but not back to his baseline, pt A&Ox4 but increased time or cueing needed intermittently for safety. Pt's wife is available to assist full time at discharge, pt and her feel comfortable with his level of mobility to return home. Acute PT will continue to follow up with pt to progress mobility and address deficits, recommend return home at discharge with HHPT. PT will follow as appropriate during admission.        Recommendations for follow up therapy are one component of a multi-disciplinary discharge planning process, led by the attending physician.  Recommendations may be updated based on patient status, additional functional criteria and insurance authorization.  Follow Up Recommendations Home health PT      Assistance Recommended at Discharge Frequent or constant Supervision/Assistance  Patient can return home with the following  A little help with walking and/or transfers;Assist for transportation;Direct supervision/assist for medications management    Equipment  Recommendations None recommended by PT  Recommendations for Other Services       Functional Status Assessment Patient has had a recent decline in their functional status and demonstrates the ability to make significant improvements in function in a reasonable and predictable amount of time.     Precautions / Restrictions Precautions Precautions: Fall Restrictions Weight Bearing Restrictions: No      Mobility  Bed Mobility Overal bed mobility: Needs Assistance Bed Mobility: Supine to Sit     Supine to sit: Supervision, HOB elevated     General bed mobility comments: supervision for safety and line management    Transfers Overall transfer level: Needs assistance Equipment used: None Transfers: Sit to/from Stand Sit to Stand: Min guard           General transfer comment: minG for safety and balance, performing from bed, arm chair, and toilet    Ambulation/Gait Ambulation/Gait assistance: Min guard Gait Distance (Feet): 150 Feet Assistive device: None Gait Pattern/deviations: Step-through pattern, Decreased stride length, Wide base of support, Drifts right/left Gait velocity: decreased     General Gait Details: minG for safety and balance, brief standing rest breaks throughout as pt reporting imbalance/dizziness, no overt LOB noted today.  Stairs            Wheelchair Mobility    Modified Rankin (Stroke Patients Only)       Balance Overall balance assessment: Needs assistance Sitting-balance support: No upper extremity supported, Feet supported Sitting balance-Leahy Scale: Good     Standing balance support: No upper extremity supported, During functional activity Standing balance-Leahy Scale: Fair Standing balance comment: close guard for safety and balance but no overt LOB noted  Pertinent Vitals/Pain Pain Assessment Pain Assessment: No/denies pain    Home Living Family/patient expects to be discharged  to:: Private residence Living Arrangements: Spouse/significant other Available Help at Discharge: Family;Available 24 hours/day Type of Home: House Home Access: Level entry       Home Layout: One level Home Equipment: Conservation officer, nature (2 wheels);Cane - single point;Grab bars - tub/shower Additional Comments: Pt with supportive wife available to assist    Prior Function Prior Level of Function : Independent/Modified Independent;Driving             Mobility Comments: independent with mobility without use of AD, drives ADLs Comments: Independent     Hand Dominance   Dominant Hand: Right    Extremity/Trunk Assessment   Upper Extremity Assessment Upper Extremity Assessment: Defer to OT evaluation    Lower Extremity Assessment Lower Extremity Assessment: Generalized weakness    Cervical / Trunk Assessment Cervical / Trunk Assessment: Normal  Communication   Communication: No difficulties  Cognition Arousal/Alertness: Awake/alert Behavior During Therapy: WFL for tasks assessed/performed Overall Cognitive Status: Impaired/Different from baseline Area of Impairment: Attention, Problem solving, Safety/judgement                   Current Attention Level: Selective     Safety/Judgement: Decreased awareness of deficits   Problem Solving: Slow processing General Comments: A&Ox4, joking throughout session. Wife reports cognition is improving but not back to baseline, pt intermittently requiring cues for safety        General Comments General comments (skin integrity, edema, etc.): VSS on room air, wife present and very attentive throughout session    Exercises     Assessment/Plan    PT Assessment Patient needs continued PT services  PT Problem List Decreased strength;Decreased activity tolerance;Decreased balance;Decreased mobility;Decreased cognition;Decreased safety awareness       PT Treatment Interventions DME instruction;Gait training;Stair  training;Functional mobility training;Therapeutic activities;Therapeutic exercise;Balance training;Neuromuscular re-education;Cognitive remediation;Patient/family education    PT Goals (Current goals can be found in the Care Plan section)  Acute Rehab PT Goals Patient Stated Goal: go home PT Goal Formulation: With patient/family Time For Goal Achievement: 02/13/23 Potential to Achieve Goals: Good    Frequency Min 3X/week     Co-evaluation               AM-PAC PT "6 Clicks" Mobility  Outcome Measure Help needed turning from your back to your side while in a flat bed without using bedrails?: A Little Help needed moving from lying on your back to sitting on the side of a flat bed without using bedrails?: A Little Help needed moving to and from a bed to a chair (including a wheelchair)?: A Little Help needed standing up from a chair using your arms (e.g., wheelchair or bedside chair)?: A Little Help needed to walk in hospital room?: A Little Help needed climbing 3-5 steps with a railing? : A Lot 6 Click Score: 17    End of Session Equipment Utilized During Treatment: Gait belt Activity Tolerance: Patient tolerated treatment well Patient left: in chair;with call bell/phone within reach;with chair alarm set;with nursing/sitter in room;with family/visitor present Nurse Communication: Mobility status PT Visit Diagnosis: Unsteadiness on feet (R26.81);Muscle weakness (generalized) (M62.81);Difficulty in walking, not elsewhere classified (R26.2)    Time: [id number] PT Time Calculation (min) (ACUTE ONLY): 23 min   Charges:   PT Evaluation $PT Eval Moderate Complexity: 1 Mod          Charlynne Cousins, PT DPT Acute Rehabilitation Services Office [phone]  Luvenia Heller 01/30/2023, 2:29 PM

## 2023-01-30 NOTE — Progress Notes (Signed)
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 Denies itching, dyspnea.  (Adverse, not allergic, reaction   Tricor [Fenofibrate] Other (See Comments)    Leg cramps    Patient Measurements: Height: 5\' 9"  (175.3 cm) Weight: 91.6 kg (202 lb) IBW/kg (Calculated) : 70.7 Heparin Dosing Weight: 90 kg  Vital Signs: Temp: 97.7 F (36.5 C) (03/23 1536) Temp Source: Oral (03/23 1536) BP: 126/65 (03/23 1536) Pulse Rate: 71 (03/23 1536)  Labs: Recent Labs    01/28/23 0427 01/28/23 0901 01/28/23 1818 01/29/23 0602 01/30/23 0622  HGB 14.2  --   --  13.7 12.9*  HCT 38.7*  --   --  38.4* 36.9*  PLT 66*  --   --  61* 54*  APTT  --  >200* 170* 101* 83*  HEPARINUNFRC  --  >1.10*  --  0.55 0.31  CREATININE 0.87  --   --  0.95  --      Estimated Creatinine Clearance: 87.9 mL/min (by C-G formula based on SCr of 0.95 mg/dL).  Assessment: 67 year old male on apixaban prior to admit for history of DVT/PE. Last dose of apixaban on 3/19. Orders to bridge with IV heparin so that LP can be done on 3/22.  Will monitor aPTT and heparin levels due to recent apixaban usage.   Heparin level is therapeutic at 0.31, aPTT is therapeutic at 83 - appears heparin level and aPTT are correlating, will proceed with heparin levels only  No problems reported by RN No bleeding noted, Hgb stable, platelets are chronically low  Heparin stopped at 1145, 3/23 LP done ~1500. Per MD, restart heparin 6 hours after LP.  Goal of Therapy:  Heparin level 0.3-0.7 units/ml aPTT 66-102 seconds Monitor platelets by anticoagulation protocol:  Yes   Plan:  Restart heparin drip at 900 units/hr @ 2100 Continue daily heparin levels Daily heparin level, CBC Monitor for s/sx of bleeding    Thank you for allowing [place] to participate in this patients care. Jens Som, PharmD 01/30/2023 4:13 PM  **Pharmacist phone directory can be found on [email or web address] listed under [place]**

## 2023-01-30 NOTE — Progress Notes (Addendum)
Addendum:  Patient's trough slightly elevated at 24 - will plan to initiate lower dose at 750mg  q8 hours and hold current dose for a few hours.   Pharmacy Antibiotic Note  [name] [name] is a 67 y.o. male admitted on 01/27/2023 with headache, dizziness, AMS, and generalized weakness.  Pharmacy has been consulted for vancomycin dosing for empiric coverage of meningitis. 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   WBC 5.2, afebrile SCr 0.95, at baseline SCr of 0.9-1  Pt initiated on Ceftriaxone 2g IV q12h and Ampicillin 2g IV q4h    MR Venogram Head: normal    Plan: Initiate loading dose of Vancomycin 2000mg  IV x 1, followed by  Vancomycin 1000mg  IV q8h    > Goal trough level ~15-20 mcg/mL Continue Ceftriaxone 2g IV q12h per MD Continue Ampicillin 2g IV q4h per MD Monitor daily CBC, temp, SCr, and for clinical signs of improvement  F/u cultures and de-escalate antibiotics as able  F/u CSF from lumbar puncture, multiple failed attempts at bedside, will follow along for IR plan   Will plan for a peak and trough today as vancomycin is at steady state. Will  follow up to adjust pending levels.   Height: 5\' 9"  (175.3 cm) Weight: 91.6 kg (202 lb) IBW/kg (Calculated) : 70.7  Temp (24hrs), Avg:98.5 F (36.9 C), Min:97.9 F (36.6 C), Max:99.4 F (37.4 C)  Recent Labs  Lab 01/27/23 1008 01/27/23 1018 01/28/23 0427 01/29/23 0602  WBC 5.1  --  4.7 5.2  CREATININE 0.97 1.00 0.87 0.95     Estimated Creatinine Clearance: 87.9 mL/min (by C-G formula based on SCr of 0.95 mg/dL).    Allergies  Allergen Reactions   Divalproex Sodium Other (See Comments)    Causes anger   Statins Other (See Comments)    Muscle aches and cramps   Tramadol Other (See Comments)    Chest pain    Valproic Acid Other (See Comments)    Causes anger   Gadolinium Derivatives Nausea And Vomiting    07/25/19 Pt vomited immediately  after IV gad. Denies itching, dyspnea.  (Adverse, not allergic, reaction   Tricor [Fenofibrate] Other (See Comments)    Leg cramps    Antimicrobials this admission: Ceftriaxone 3/20 >>  Ampicillin 3/20 >>  Vancomycin 3/29 >>   Dose adjustments this admission: N/A  Microbiology results: None  Thank you for allowing pharmacy to be a part of this patient's care.  Vicenta Dunning, PharmD  PGY1 Pharmacy Resident

## 2023-01-31 ENCOUNTER — Inpatient Hospital Stay (HOSPITAL_COMMUNITY): Payer: 59

## 2023-01-31 DIAGNOSIS — G9341 Metabolic encephalopathy: Secondary | ICD-10-CM | POA: Diagnosis not present

## 2023-01-31 LAB — C-REACTIVE PROTEIN: CRP: 0.5 mg/dL (ref <1.0)

## 2023-01-31 LAB — GLUCOSE, CAPILLARY
Glucose-Capillary: 120 mg/dL — ABNORMAL HIGH (ref 70–99)
Glucose-Capillary: 130 mg/dL — ABNORMAL HIGH (ref 70–99)
Glucose-Capillary: 129 mg/dL — ABNORMAL HIGH (ref 70–99)

## 2023-01-31 LAB — BASIC METABOLIC PANEL
Anion gap: 7 (ref 5–15)
BUN: 10 mg/dL (ref 8–23)
CO2: 20 mmol/L — ABNORMAL LOW (ref 22–32)
Calcium: 8.3 mg/dL — ABNORMAL LOW (ref 8.9–10.3)
Chloride: 112 mmol/L — ABNORMAL HIGH (ref 98–111)
Creatinine, Ser: 0.91 mg/dL (ref 0.61–1.24)
GFR, Estimated: >60 mL/min (ref >60)
Glucose, Bld: 110 mg/dL — ABNORMAL HIGH (ref 70–99)
Potassium: 4.2 mmol/L (ref 3.5–5.1)
Sodium: 139 mmol/L (ref 135–145)

## 2023-01-31 LAB — CBC
HCT: 36.8 % — ABNORMAL LOW (ref 39.0–52.0)
Hemoglobin: 12.7 g/dL — ABNORMAL LOW (ref 13.0–17.0)
MCH: 30.9 pg (ref 26.0–34.0)
MCHC: 34.5 g/dL (ref 30.0–36.0)
MCV: 89.5 fL (ref 80.0–100.0)
Platelets: 58 10*3/uL — ABNORMAL LOW (ref 150–400)
RBC: 4.11 MIL/uL — ABNORMAL LOW (ref 4.22–5.81)
RDW: 14.6 % (ref 11.5–15.5)
WBC: 2.7 10*3/uL — ABNORMAL LOW (ref 4.0–10.5)
nRBC: 0 % (ref 0.0–0.2)

## 2023-01-31 LAB — PROCALCITONIN: Procalcitonin: <0.1 ng/mL

## 2023-01-31 LAB — MAGNESIUM: Magnesium: 2.2 mg/dL (ref 1.7–2.4)

## 2023-01-31 LAB — BRAIN NATRIURETIC PEPTIDE: B Natriuretic Peptide: 117.3 pg/mL — ABNORMAL HIGH (ref 0.0–100.0)

## 2023-01-31 LAB — HEPARIN LEVEL (UNFRACTIONATED): Heparin Unfractionated: 0.19 IU/mL — ABNORMAL LOW (ref 0.30–0.70)

## 2023-01-31 MED ORDER — APIXABAN 5 MG PO TABS
5.0000 mg | ORAL_TABLET | Freq: Two times a day (BID) | ORAL | Status: DC
  Administered 2023-01-31 – 2023-02-02 (×5): 5 mg via ORAL
  Filled 2023-01-31 (×5): qty 1

## 2023-01-31 MED ORDER — GADOBUTROL 1 MMOL/ML IV SOLN
9.0000 mL | Freq: Once | INTRAVENOUS | Status: AC | PRN
  Administered 2023-01-31: 9 mL via INTRAVENOUS

## 2023-01-31 NOTE — Progress Notes (Signed)
SLP Cancellation Note  Patient Details Name: [name] [name] MRN: [id number] DOB: 12/22/1955   Cancelled treatment:       Reason Eval/Treat Not Completed: Patient at procedure or test/unavailable. SLP will continue efforts.  Sonia Baller, MA, CCC-SLP Speech Therapy

## 2023-01-31 NOTE — Progress Notes (Signed)
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 Denies itching, dyspnea.  (Adverse, not allergic, reaction   Tricor [Fenofibrate] Other (See Comments)    Leg cramps    Patient Measurements: Height: 5\' 9"  (175.3 cm) Weight: 91.6 kg (202 lb) IBW/kg (Calculated) : 70.7 Heparin Dosing Weight: 90 kg  Vital Signs: Temp: 98.1 F (36.7 C) (03/24 0713) Temp Source: Oral (03/24 0713) BP: 132/52 (03/24 0842) Pulse Rate: 68 (03/24 0842)  Labs: Recent Labs    01/28/23 1818 01/29/23 0602 01/29/23 0602 01/30/23 0622 01/31/23 0326  HGB  --  13.7   < > 12.9* 12.7*  HCT  --  38.4*  --  36.9* 36.8*  PLT  --  61*  --  54* 58*  APTT 170* 101*  --  83*  --   HEPARINUNFRC  --  0.55  --  0.31 0.19*  CREATININE  --  0.95  --   --   --    < > = values in this interval not displayed.     Estimated Creatinine Clearance: 87.9 mL/min (by C-G formula based on SCr of 0.95 mg/dL).  Assessment: 67 year old male on apixaban prior to admit for history of DVT/PE. Last dose of apixaban on 3/19. Orders to bridge with IV heparin so that LP can be done on 3/22.  LP completed and results reassuring to rule out meningitis. Will transition back to Eliquis.    Goal of Therapy:  Monitor platelets by anticoagulation protocol: Yes   Plan:  Stop heparin today at 1000  Start Eliquis 5mg  BID today at 1000 Monitor for signs and symptoms of bleeding   Vicenta Dunning, PharmD  PGY1 Pharmacy Resident

## 2023-01-31 NOTE — Progress Notes (Addendum)
MRI completed.  The prior lesions seen-DWI positive cortical diffusion abnormality primarily in the vertex has not resolved.  There is no abnormal enhancement.  Still carries a broad differential Due to the fact that clinically he is doing well, I do not anticipate this to be CJD or a B-cell lymphoma. Either way the flow cytometry and cytology is pending. Again I do not suspect clinically CJD so I am not going to order a protein 14 3 3  Possible differentials include embolic strokes in the setting of an autoimmune endocarditis versus RCVS. I will recommend completing a full stroke workup to include an echocardiogram knowing very well that a echocardiogram was done in February but the symptoms have started since then and I will repeat the echo. This has been discussed with the hospitalist who is going to order the full stroke workup He can be back on his Eliquis Stroke team will follow him and he will need close outpatient follow-up with [facility] neurology in the coming weeks.  -- Amie Portland, MD Neurologist [facility] Pager: [phone]

## 2023-01-31 NOTE — Evaluation (Signed)
Clinical/Bedside Swallow Evaluation Patient Details  Name: [name] [name] MRN: [id number] Date of Birth: 1956/10/23  Today's Date: 01/31/2023 Time: SLP Start Time (ACUTE ONLY): 52 SLP Stop Time (ACUTE ONLY): 72 SLP Time Calculation (min) (ACUTE ONLY): 15 min  Past Medical History:  Past Medical History:  Diagnosis Date   Anxiety    Aortic insufficiency    Arthritis    Asthma    Cataract    Right eye   Chronic lower back pain    Cirrhosis of liver ([place])    Coronary atherosclerosis of native coronary artery    a. 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   LIMA to LAD, free RIMA to OM2, SVG to PDA, open SVG harvest from right thigh   Stroke (HCC)    Thrombocytopenia (HCC)    TIA (transient ischemic attack)    Type 2 diabetes mellitus ([place])    Past Surgical History:  Past Surgical History:  Procedure Laterality Date   ANTERIOR CERVICAL DECOMP/DISCECTOMY FUSION  1998   "C3-4"   CARDIAC CATHETERIZATION  "several"   CARDIAC CATHETERIZATION N/A 12/30/2015   Procedure: Left Heart Cath and Coronary Angiography;  Surgeon: [name], MD;  Location: [place] [facility];  Service: Cardiovascular;  Laterality: N/A;   CARPAL TUNNEL RELEASE Bilateral 2005   CHOLECYSTECTOMY N/A 11/28/2018   Procedure: LAPAROSCOPIC CHOLECYSTECTOMY;  Surgeon: [name], MD;  Location: [facility];  Service: General;  Laterality: N/A;   COLONOSCOPY     COLONOSCOPY WITH PROPOFOL N/A 09/27/2020   Procedure: COLONOSCOPY WITH PROPOFOL;  Surgeon: [name], MD;  Location: [facility];  Service: Gastroenterology;   Laterality: N/A;   CORONARY ANGIOPLASTY     CORONARY ANGIOPLASTY WITH STENT PLACEMENT  2002; 2003; 11/20/2014   "I have 4 stents after today" (11/20/2014)   CORONARY ARTERY BYPASS GRAFT N/A 01/09/2016   Procedure: CORONARY ARTERY BYPASS GRAFTING (CABG) X 3 UTILIZING RIGHT AND LEFT INTERNAL MAMMARY ARTERY AND ENDOSCOPICALLY HARVESTED SAPHENEOUS VEIN.;  Surgeon: Rexene Alberts, MD;  Location: [place];  Service: Open Heart Surgery;  Laterality: N/A;   ESOPHAGOGASTRODUODENOSCOPY     ESOPHAGOGASTRODUODENOSCOPY (EGD) WITH PROPOFOL N/A 09/27/2020   Procedure: ESOPHAGOGASTRODUODENOSCOPY (EGD) WITH PROPOFOL;  Surgeon: [name], MD;  [facility]: [facility];  Service: Gastroenterology;  Laterality: N/A;  10   KNEE SURGERY Left 02/2012   "scraped; open"   LEFT HEART CATH AND CORS/GRAFTS ANGIOGRAPHY N/A 08/09/2017   Procedure: LEFT HEART CATH AND CORS/GRAFTS ANGIOGRAPHY;  Surgeon: [name], MD;  Location: [place] [facility];  Service: Cardiovascular;  Laterality: N/A;   LEFT HEART CATH AND CORS/GRAFTS ANGIOGRAPHY N/A 11/22/2018   Procedure: LEFT HEART CATH AND CORS/GRAFTS ANGIOGRAPHY;  Surgeon: [name], [name], MD;  Location: [place] [facility];  Service: Cardiovascular;  Laterality: N/A;   LEFT HEART CATH AND CORS/GRAFTS ANGIOGRAPHY N/A 08/20/2022   Procedure: LEFT HEART CATH AND CORS/GRAFTS ANGIOGRAPHY;  Surgeon: Burnell Blanks, MD;  Location: [place] [facility];  Service: Cardiovascular;  Laterality:  N/A;   LEFT HEART CATHETERIZATION WITH CORONARY ANGIOGRAM N/A 07/20/2012   Procedure: LEFT HEART CATHETERIZATION WITH CORONARY ANGIOGRAM;  Surgeon: [name], MD;  Location: [place] [facility];  Service: Cardiovascular;  Laterality: N/A;   LEFT HEART CATHETERIZATION WITH CORONARY ANGIOGRAM N/A 11/20/2014   Procedure: LEFT HEART CATHETERIZATION WITH CORONARY ANGIOGRAM;  Surgeon: [name], MD;  Location: [facility];  Service: Cardiovascular;  Laterality: N/A;   LEFT HEART  CATHETERIZATION WITH CORONARY ANGIOGRAM N/A 11/26/2014   Procedure: LEFT HEART CATHETERIZATION WITH CORONARY ANGIOGRAM;  Surgeon: [name], MD;  Location: [facility] C[facility] LAB;  Service: Cardiovascular;  Laterality: N/A;   NEUROPLASTY / TRANSPOSITION ULNAR NERVE AT ELBOW Right ~ 2012   PERCUTANEOUS CORONARY ROTOBLATOR INTERVENTION (PCI-R)  11/20/2014   Procedure: PERCUTANEOUS CORONARY ROTOBLATOR INTERVENTION (PCI-R);  Surgeon: [name], MD;  Location: [facility] CATH LAB;  Service: Cardiovascular;;   POSTERIOR CERVICAL LAMINECTOMY Left 04/16/2022   Procedure: Laminectomy and Foraminotomy - left - C6-C7;  Surgeon: Earnie Larsson, MD;  Location: [place];  Service: Neurosurgery;  Laterality: Left;  3C   SHOULDER ARTHROSCOPY Left ~ 2011   TEE WITHOUT CARDIOVERSION N/A 01/09/2016   Procedure: TRANSESOPHAGEAL ECHOCARDIOGRAM (TEE);  Surgeon: Rexene Alberts, MD;  Location: [place];  Service: Open Heart Surgery;  Laterality: N/A;   HPI:  Patient is a 67 y.o. male with PMH: CAD s/p multiple PCI's and CABG in 2017, HFpEF, C3-C4 ACDF 1998, h/o EGD with dilation 2021 which found small hiatal hernia and grade I esophageal varices. He presented to the hospital on 01/27/2023 one day h/o confusion and headaches. MRI showed small cortically based foci of restricted diffusion at the frontoparietal vertex with broad differentials including that of vasculitis, autoimmune cerebritis, infectious meningitis and atypical CJD along with atypical watershed infarctions and CNS lymphoma. Repeat MRI brain showed Unresolved unusual cortical diffusion abnormality, primarily bilaterally at the vertex but perhaps also subtle involvement lower down such as left temporal lobe but no definite brand new areas of involvement.    Assessment / Plan / Recommendation  Clinical Impression  Patient is not currently presenting with clinical s/s of dysphagia as per this bedside swallow evaluation. Per chart review and discussion with patient and his spouse, he has  a h/o dysphagia which began when he had ACDF in 1998. Spouse reported that he was told he would need to have regular EGD's with dilation but some time later, another doctor told him he did not if he wasn't having symptoms. Patient's most recent EGD with dilation was in 2021. Currently, patient does not have any new c/o regarding his swallow function and reports that he eats small amounts, does not lay flat after eating but he does have cough symptoms as a result of his esophageal dysphagia. SLP observed patient with PO's of regular solids (hard pretzels) and thin liquids. No overt s/s aspiration or penetration observed during or after PO's and patient without complaints of globus sensation or difficulty with swallowing. SLP is not suspecting patient to have had a significant change in his swallow function. Recommendation is for f/u with GI as needed for management of his chronic esophageal dysphagia. SLP Visit Diagnosis: Dysphagia, unspecified (R13.10)    Aspiration Risk  Mild aspiration risk    Diet Recommendation Regular;Thin liquid   Liquid Administration via: Cup;Straw Medication Administration: Whole meds with liquid Supervision: Patient able to self feed Compensations: Slow rate;Small sips/bites Postural Changes: Remain upright for at least 30 minutes after po intake;Seated upright at 90  degrees    Other  Recommendations Recommended Consults: Other (Comment) (f/u with GI as OP) Oral Care Recommendations: Oral care BID    Recommendations for follow up therapy are one component of a multi-disciplinary discharge planning process, led by the attending physician.  Recommendations may be updated based on patient status, additional functional criteria and insurance authorization.  Follow up Recommendations No SLP follow up      Assistance Recommended at Discharge    Functional Status Assessment Patient has not had a recent decline in their functional status  Frequency and Duration   N/A          Prognosis   N/A     Swallow Study   General Date of Onset: 01/31/23 HPI: Patient is a 67 y.o. male with PMH: CAD s/p multiple PCI's and CABG in 2017, HFpEF, C3-C4 ACDF 1998, h/o EGD with dilation 2021 which found small hiatal hernia and grade I esophageal varices. He presented to the hospital on 01/27/2023 one day h/o confusion and headaches. MRI showed small cortically based foci of restricted diffusion at the frontoparietal vertex with broad differentials including that of vasculitis, autoimmune cerebritis, infectious meningitis and atypical CJD along with atypical watershed infarctions and CNS lymphoma. Repeat MRI brain showed Unresolved unusual cortical diffusion abnormality, primarily bilaterally at the vertex but perhaps also subtle involvement lower down such as left temporal lobe but no definite brand new areas of involvement. Type of Study: Bedside Swallow Evaluation Previous Swallow Assessment: none found Diet Prior to this Study: Regular;Thin liquids (Level 0) Temperature Spikes Noted: No Respiratory Status: Room air History of Recent Intubation: No Behavior/Cognition: Alert;Pleasant mood;Cooperative Oral Cavity Assessment: Within Functional Limits Oral Care Completed by SLP: No Oral Cavity - Dentition: Adequate natural dentition Vision: Functional for self-feeding Self-Feeding Abilities: Able to feed self Patient Positioning: Upright in bed Baseline Vocal Quality: Normal Volitional Cough: Strong Volitional Swallow: Able to elicit    Oral/Motor/Sensory Function Overall Oral Motor/Sensory Function: Within functional limits   Ice Chips     Thin Liquid Thin Liquid: Within functional limits Presentation: Straw;Self Fed    Nectar Thick     Honey Thick     Puree Puree: Not tested   Solid     Solid: Within functional limits Presentation: [place], MA, CCC-SLP Speech Therapy

## 2023-01-31 NOTE — Progress Notes (Signed)
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 Denies itching, dyspnea.  (Adverse, not allergic, reaction   Tricor [Fenofibrate] Other (See Comments)    Leg cramps    Patient Measurements: Height: 5\' 9"  (175.3 cm) Weight: 91.6 kg (202 lb) IBW/kg (Calculated) : 70.7 Heparin Dosing Weight: 90 kg  Vital Signs: Temp: 97.7 F (36.5 C) (03/24 0357) Temp Source: Oral (03/24 0357) BP: 131/68 (03/24 0357) Pulse Rate: 70 (03/24 0357)  Labs: Recent Labs    01/28/23 0901 01/28/23 1818 01/29/23 0602 01/30/23 0622 01/31/23 0326  HGB   < >  --  13.7 12.9* 12.7*  HCT  --   --  38.4* 36.9* 36.8*  PLT  --   --  61* 54* 58*  APTT  --  170* 101* 83*  --   HEPARINUNFRC  --   --  0.55 0.31 0.19*  CREATININE  --   --  0.95  --   --    < > = values in this interval not displayed.     Estimated Creatinine Clearance: 87.9 mL/min (by C-G formula based on SCr of 0.95 mg/dL).  Assessment: 67 year old male on apixaban prior to admit for history of DVT/PE. Last dose of apixaban on 3/19. Orders to bridge with IV heparin so that LP can be done on 3/22.  Will monitor aPTT and heparin levels due to recent apixaban usage.   Heparin level is therapeutic at 0.31, aPTT is therapeutic at 83 - appears heparin level and aPTT are correlating, will proceed with heparin levels only  No problems reported by RN No bleeding noted, Hgb stable, platelets are chronically low  Heparin stopped at 1145, 3/23 LP done ~1500. Per MD, restart heparin 6 hours after LP.  3/24 AM update:  Heparin level sub-therapeutic  Plts low but stable No  need for further aPTT's  Goal of Therapy:  Heparin level 0.3-0.7 units/mL Monitor platelets by anticoagulation protocol: Yes   Plan:  Inc heparin to 1050 units/hr 1300 heparin level  Narda Bonds, PharmD, BCPS Clinical Pharmacist Phone: [phone]

## 2023-01-31 NOTE — Progress Notes (Signed)
Neurology Progress Note   S:// S/p fluoro guided LP yesterday.  CSF analysis with no infections.  Protein mildly elevated at 58 but otherwise unremarkable.  Reports no headache  O:// Current vital signs: BP 119/64 (BP Location: Left Arm)   Pulse 67   Temp 98.1 F (36.7 C) (Oral)   Resp 17   Ht 5\' 9"  (1.753 m)   Wt 91.6 kg   SpO2 98%   BMI 29.83 kg/m  Vital signs in last 24 hours: Temp:  [97.6 F (36.4 C)-98.1 F (36.7 C)] 98.1 F (36.7 C) (03/24 0713) Pulse Rate:  [63-71] 67 (03/24 0713) Resp:  [12-18] 17 (03/24 0357) BP: (119-138)/(64-79) 119/64 (03/24 0713) SpO2:  [95 %-99 %] 98 % (03/24 0743)  General awake alert in no distress HEENT: Normocephalic atraumatic CVs: Regular rhythm Neurological exam Awake alert oriented x 3 No dysarthria No aphasia Mildly reduced attention concentration Cranial nerves 2-12 intact Motor examination with no drift Sensation intact to light touch Coordination exam with no dysmetria  Medications  Current Facility-Administered Medications:    acetaminophen (TYLENOL) tablet 650 mg, 650 mg, Oral, Q6H PRN, 650 mg at 01/27/23 2023 **OR** acetaminophen (TYLENOL) suppository 650 mg, 650 mg, Rectal, Q6H PRN, Tat, David, MD   albuterol (PROVENTIL) (2.5 MG/3ML) 0.083% nebulizer solution 2.5 mg, 2.5 mg, Nebulization, Q6H PRN, Tat, David, MD   ampicillin (OMNIPEN) 2 g in sodium chloride 0.9 % 100 mL IVPB, 2 g, Intravenous, Q4H, Greta Doom, MD, Last Rate: 300 mL/hr at 01/31/23 0444, 2 g at 01/31/23 0444   carvedilol (COREG) tablet 3.125 mg, 3.125 mg, Oral, BID WC, Tat, David, MD, 3.125 mg at 01/30/23 1713   cefTRIAXone (ROCEPHIN) 2 g in sodium chloride 0.9 % 100 mL IVPB, 2 g, Intravenous, Q12H, Greta Doom, MD, Last Rate: 200 mL/hr at 01/31/23 0018, 2 g at 01/31/23 0018   citalopram (CELEXA) tablet 20 mg, 20 mg, Oral, QHS, Tat, Shanon Brow, MD, 20 mg at 01/30/23 2155   dapagliflozin propanediol (FARXIGA) tablet 5 mg, 5 mg, Oral, QAC  breakfast, Tat, David, MD, 5 mg at 01/30/23 [id number]   ezetimibe (ZETIA) tablet 10 mg, 10 mg, Oral, Daily, Tat, David, MD, 10 mg at 01/30/23 0806   heparin ADULT infusion 100 units/mL (25000 units/273mL), 1,050 Units/hr, Intravenous, Continuous, Erenest Blank, RPH, Last Rate: 10.5 mL/hr at 01/31/23 0558, 1,050 Units/hr at 01/31/23 0558   HYDROcodone-acetaminophen (NORCO/VICODIN) 5-325 MG per tablet 1 tablet, 1 tablet, Oral, Q6H PRN, Tat, David, MD, 1 tablet at 01/30/23 1713   HYDROmorphone (DILAUDID) injection 0.5 mg, 0.5 mg, Intravenous, Q2H PRN, Howerter, Justin B, DO, 0.5 mg at 01/29/23 2351   insulin aspart (novoLOG) injection 0-5 Units, 0-5 Units, Subcutaneous, QHS, Tat, David, MD, 2 Units at 01/28/23 2140   insulin aspart (novoLOG) injection 0-9 Units, 0-9 Units, Subcutaneous, TID WC, Tat, David, MD, 2 Units at 01/30/23 1713   isosorbide mononitrate (IMDUR) 24 hr tablet 30 mg, 30 mg, Oral, QHS, Tat, David, MD, 30 mg at 01/30/23 2154   isosorbide mononitrate (IMDUR) 24 hr tablet 60 mg, 60 mg, Oral, Daily, Tat, David, MD, 60 mg at 01/30/23 0806   labetalol (NORMODYNE) injection 10 mg, 10 mg, Intravenous, Q2H PRN, Howerter, Justin B, DO   melatonin tablet 9 mg, 9 mg, Oral, QHS, Tat, Shanon Brow, MD, 9 mg at 01/30/23 2210   mometasone-formoterol (DULERA) 200-5 MCG/ACT inhaler 2 puff, 2 puff, Inhalation, BID, Tat, David, MD, 2 puff at 01/31/23 0742   naloxone (NARCAN) injection 0.4 mg, 0.4  mg, Intravenous, PRN, Howerter, Justin B, DO   ondansetron (ZOFRAN) tablet 4 mg, 4 mg, Oral, Q6H PRN **OR** ondansetron (ZOFRAN) injection 4 mg, 4 mg, Intravenous, Q6H PRN, Tat, David, MD, 4 mg at 01/30/23 1258   Oral care mouth rinse, 15 mL, Mouth Rinse, PRN, Tat, David, MD   pantoprazole (PROTONIX) EC tablet 40 mg, 40 mg, Oral, Daily, Tat, David, MD, 40 mg at 01/30/23 0806   rosuvastatin (CRESTOR) tablet 20 mg, 20 mg, Oral, Daily, Tat, David, MD, 20 mg at 01/30/23 0806   vancomycin (VANCOREADY) IVPB 750 mg/150 mL, 750  mg, Intravenous, Q8H, Thurnell Lose, MD, Last Rate: 150 mL/hr at 01/31/23 0133, 750 mg at 01/31/23 0133   zonisamide (ZONEGRAN) capsule 100 mg, 100 mg, Oral, Daily, Tat, David, MD, 100 mg at 01/30/23 0806 Labs CBC    Component Value Date/Time   WBC 2.7 (L) 01/31/2023 0326   RBC 4.11 (L) 01/31/2023 0326   HGB 12.7 (L) 01/31/2023 0326   HGB 14.9 09/29/2022 1607   HCT 36.8 (L) 01/31/2023 0326   HCT 43.6 09/29/2022 1607   PLT 58 (L) 01/31/2023 0326   PLT 99 (LL) 09/29/2022 1607   MCV 89.5 01/31/2023 0326   MCV 94 09/29/2022 1607   MCH 30.9 01/31/2023 0326   MCHC 34.5 01/31/2023 0326   RDW 14.6 01/31/2023 0326   RDW 13.6 09/29/2022 1607   LYMPHSABS 0.9 01/27/2023 1008   LYMPHSABS 1.0 09/29/2022 1607   MONOABS 0.4 01/27/2023 1008   EOSABS 0.1 01/27/2023 1008   EOSABS 0.1 09/29/2022 1607   BASOSABS 0.0 01/27/2023 1008   BASOSABS 0.0 09/29/2022 1607    CMP     Component Value Date/Time   NA 139 01/29/2023 0602   NA 140 09/29/2022 1607   K 3.4 (L) 01/29/2023 0602   CL 110 01/29/2023 0602   CO2 17 (L) 01/29/2023 0602   GLUCOSE 160 (H) 01/29/2023 0602   BUN 9 01/29/2023 0602   BUN 16 09/29/2022 1607   CREATININE 0.95 01/29/2023 0602   CREATININE 1.04 08/15/2020 1451   CALCIUM 8.6 (L) 01/29/2023 0602   PROT 7.5 01/27/2023 1008   PROT 7.2 09/29/2022 1607   ALBUMIN 3.7 01/27/2023 1008   ALBUMIN 3.7 (L) 09/29/2022 1607   AST 33 01/27/2023 1008   ALT 26 01/27/2023 1008   ALKPHOS 52 01/27/2023 1008   BILITOT 1.3 (H) 01/27/2023 1008   BILITOT 0.4 09/29/2022 1607   GFRNONAA >60 01/29/2023 0602   GFRAA >60 07/27/2020 0525     Lipid Panel     Component Value Date/Time   CHOL 108 09/29/2022 1607   TRIG 112 09/29/2022 1607   HDL 32 (L) 09/29/2022 1607   CHOLHDL 3.4 09/29/2022 1607   CHOLHDL 6.8 07/28/2021 0531   VLDL 41 (H) 07/28/2021 0531   LDLCALC 55 09/29/2022 1607     Imaging I have reviewed images in epic and the results pertinent to this consultation  are: MRI of the brain done yesterday shows small foci of restricted diffusion in the cortical frontoparietal vertex in both sides, slightly more prominent on the right than the left.  There is no large confluent infarction.  Findings unusual for central origin microembolic infarctions-differentials include atypical watershed insults, meningitis, autoimmune cerebritis, lymphoma.  MRV: Normal noncontrast MRV head with no evidence of venous sinus thrombosis.  Assessment:  67 year old man with past medical history of migraines, prior stroke with no residual deficits, anxiety with acute onset of confusion, not feeling right and severe unremitting headache  with imaging by MRI showing small cortically based foci of restricted diffusion at the frontoparietal vertex in both size with broad differentials including that of vasculitis, autoimmune cerebritis, infectious meningitis etc.. MRV normal. LP completed under fluoroscopy-unremarkable CSF findings Headache has resolved.  Mental status has improved nearly to baseline according to wife. At this point, I think the differential diagnosis at the top would be more of a complex migraine but I will repeat an MRI to ensure that there is not still the persistence of those MRI findings.  If the MRI findings persist, may need to do steroids for a presumed inflammatory etiology although CSF only has very mild elevated protein.  Recommendations: MRI brain with and without contrast to be repeated If shows resolution of the previously seen lesions, no further inpatient workup. If shows persistent lesions, will recommend 3 days of IV Solu-Medrol 1 g daily Plan discussed with Dr. Candiss Norse Will follow with you after MRI is completed.  -- Amie Portland, MD Neurologist [facility] Pager: [phone]

## 2023-01-31 NOTE — Progress Notes (Addendum)
PROGRESS NOTE        PATIENT DETAILS Name: [name] [name] Age: 67 y.o. Sex: male Date of Birth: 05/05/1956 Admit Date: 01/27/2023 Admitting Physician Orson Eva, MD [id number], [name] Nordmann, MD  Brief Summary: Patient is a 67 y.o.  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  He was initially seen at [facility] emergency room-and transferred to [facility] for neurology evaluation.  Significant events: 3/20>> admit to [facility]  Significant studies: 3/20>> MRI brain: Small foci of restricted diffusion at the cortical frontoparietal vertex on both sides. 3/20>> CT angio head/neck: No LVO.  25% stenosis of both ICA bulb regions, 50% stenosis of right vertebral artery, 30% stenosis of left vertebral artery.  Significant microbiology data: None  Procedures: None  Consults: Neurology   Subjective: Patient in bed, appears comfortable, denies any headache, his photophobia has resolved, no fever, no chest pain or pressure, no shortness of breath , no abdominal pain. No new focal weakness.    Objective: Vitals: Blood pressure (!) 132/52, pulse 68, temperature 98.1 F (36.7 C), temperature source Oral, resp. rate 17, height 5\' 9"  (1.753 m), weight 91.6 kg, SpO2 98 %.   Exam:  Awake Alert, O x 3, No new F.N deficits, Normal affect [place].AT,PERRAL Supple Neck, No JVD,   Symmetrical Chest wall movement, Good air movement bilaterally, CTAB RRR,No Gallops, Rubs or new Murmurs,  +ve B.Sounds, Abd Soft, No tenderness,   No Cyanosis, Clubbing or edema   Assessment/Plan:  Intractable headache with metabolic encephalopathy Headache has resolved-after Decadron 10 mg IV on 3/21 Encephalopathy overall improved-but had some hallucinations last night Suspicion for meningoencephalitis-but differentials include vasculitis/autoimmune cerebritis etc. given MRI brain  findings. Remains on empiric broad-spectrum antibiotics to cover meningitis After Plavix washout LP was done on 01/30/2023 CSF looks reassuring not consistent with active infection, case discussed with neurologist on 01/31/2023, discontinue all antibiotics on 01/31/2023, repeat MRI 01/31/23, if signs of inflammation then trial of steroids.  Thankfully patient's symptoms are much better on 01/31/2023.  History of migraine headache/complex migraine Headache resolved after a dose of 10 mg Decadron yesterday.   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  CAD No anginal symptoms Continue Crestor/Coreg/Imdur  Chronic HFpEF Euvolemic Continue Farxiga  HLD Statin/Zetia  History of NASH liver cirrhosis Relatively compensated NH4 normal at time of admission  Thrombocytopenia Chronic issue Follows with hematology/oncology-unclear whether this is from cirrhosis (per hematology note).  He does have splenomegaly per CT scan February 2023.  Anxiety Celexa  DM-2 (A1c 7.8 09/29/2021) Monitor CBGs on SSI Continue Farxiga  Recent Labs    01/30/23 2112 01/30/23 2203 01/31/23 0717  GLUCAP 152* 132* 120*      BMI: Estimated body mass index is 29.83 kg/m as calculated from the following:   Height as of this encounter: 5\' 9"  (1.753 m).   Weight as of this encounter: 91.6 kg.   Code status:   Code Status: Full Code   DVT Prophylaxis:IV heparin    Family Communication: Spouse at bedside 01/30/23, 01/31/23   Disposition Plan: Status is: Inpatient Remains  inpatient appropriate because: Severity of illness   Planned Discharge Destination:Home   Diet: Diet Order             Diet Carb Modified Fluid consistency: Thin; Room service  appropriate? Yes  Diet effective now                   MEDICATIONS: Scheduled Meds:  carvedilol  3.125 mg Oral BID WC   citalopram  20 mg Oral QHS   dapagliflozin propanediol  5 mg Oral QAC breakfast   ezetimibe  10 mg Oral Daily   insulin aspart  0-5 Units Subcutaneous QHS   insulin aspart  0-9 Units Subcutaneous TID WC   isosorbide mononitrate  30 mg Oral QHS   isosorbide mononitrate  60 mg Oral Daily   melatonin  9 mg Oral QHS   mometasone-formoterol  2 puff Inhalation BID   pantoprazole  40 mg Oral Daily   rosuvastatin  20 mg Oral Daily   zonisamide  100 mg Oral Daily   Continuous Infusions:  heparin 1,050 Units/hr (01/31/23 0558)   PRN Meds:.acetaminophen **OR** acetaminophen, albuterol, HYDROcodone-acetaminophen, labetalol, naLOXone (NARCAN)  injection, ondansetron **OR** ondansetron (ZOFRAN) IV, mouth rinse   I have personally reviewed following labs and imaging studies  LABORATORY DATA:  Recent Labs  Lab 01/27/23 1008 01/27/23 1018 01/28/23 0427 01/29/23 0602 01/30/23 0622 01/31/23 0326  WBC 5.1  --  4.7 5.2 2.9* 2.7*  HGB 16.3 15.6 14.2 13.7 12.9* 12.7*  HCT 45.5 46.0 38.7* 38.4* 36.9* 36.8*  PLT 85*  --  66* 61* 54* 58*  MCV 87.5  --  87.0 87.7 89.6 89.5  MCH 31.3  --  31.9 31.3 31.3 30.9  MCHC 35.8  --  36.7* 35.7 35.0 34.5  RDW 14.7  --  14.6 14.1 14.6 14.6  LYMPHSABS 0.9  --   --   --   --   --   MONOABS 0.4  --   --   --   --   --   EOSABS 0.1  --   --   --   --   --   BASOSABS 0.0  --   --   --   --   --     Recent Labs  Lab 01/27/23 1008 01/27/23 1018 01/27/23 1052 01/28/23 0427 01/29/23 0602 01/30/23 0622 01/31/23 0326  NA 137 142  --  137 139  --   --   K 3.7 3.9  --  3.2* 3.4*  --   --   CL 106 108  --  109 110  --   --   CO2 20*  --   --  18* 17*  --   --   ANIONGAP 11  --   --  10 12  --   --   GLUCOSE 155* 155*  --  103* 160*  --   --   BUN 15 15  --  9 9  --   --   CREATININE 0.97 1.00  --  0.87 0.95  --   --   AST 33  --    --   --   --   --   --   ALT 26  --   --   --   --   --   --   ALKPHOS 52  --   --   --   --   --   --   BILITOT 1.3*  --   --   --   --   --   --  ALBUMIN 3.7  --   --   --   --   --   --   CRP  --   --   --   --   --  <0.5 0.5  PROCALCITON  --   --   --   --   --  <0.10 <0.10  INR 1.6*  --   --   --   --   --   --   TSH 1.077  --   --   --   --   --   --   HGBA1C 5.9*  --   --   --   --   --   --   AMMONIA  --   --  18  --   --   --   --   BNP  --   --   --   --   --  119.2* 117.3*  MG  --   --   --   --   --  1.6* 2.2  CALCIUM 9.0  --   --  8.3* 8.6*  --   --       Recent Labs  Lab 01/27/23 1008 01/27/23 1052 01/28/23 0427 01/29/23 0602 01/30/23 0622 01/31/23 0326  CRP  --   --   --   --  <0.5 0.5  PROCALCITON  --   --   --   --  <0.10 <0.10  INR 1.6*  --   --   --   --   --   TSH 1.077  --   --   --   --   --   HGBA1C 5.9*  --   --   --   --   --   AMMONIA  --  18  --   --   --   --   BNP  --   --   --   --  119.2* 117.3*  MG  --   --   --   --  1.6* 2.2  CALCIUM 9.0  --  8.3* 8.6*  --   --      RADIOLOGY STUDIES/RESULTS: DG FL GUIDED LUMBAR PUNCTURE  Result Date: 01/30/2023 CLINICAL DATA:  67 year old male with complaint of AMS, encephalopathy. EXAM: LUMBAR PUNCTURE UNDER FLUOROSCOPY PROCEDURE: An appropriate skin entry site was determined fluoroscopically. Operator donned sterile gloves and mask. Skin site was marked, then prepped with Betadine, draped in usual sterile fashion, and infiltrated locally with 1% lidocaine. A 20 gauge spinal needle advanced into the thecal sac at L3-L4 from a right interlaminar approach. Clear colorless CSF spontaneously returned, with opening pressure of 21 cm water. 13 ml CSF were collected and divided among 4 sterile vials for the requested laboratory studies. The needle was then removed and the skin cleansed and bandaged. The patient tolerated the procedure well. No immediate complication. FLUOROSCOPY: Radiation Exposure Index (as  provided by the fluoroscopic device): 15.6 mGy Kerma IMPRESSION: Technically successful lumbar puncture under fluoroscopy. This exam was performed by Brynda Greathouse PA-C, and was supervised and interpreted by Van Clines, MD. Electronically Signed   By: Van Clines M.D.   On: 01/30/2023 15:12   DG Chest Port 1 View  Result Date: 01/30/2023 CLINICAL DATA:  Shortness of breath. EXAM: PORTABLE CHEST 1 VIEW COMPARISON:  01/17/2023 FINDINGS: Low volume film. Asymmetric elevation right hemidiaphragm. The lungs are clear without focal pneumonia, edema, pneumothorax or pleural effusion. The cardiopericardial silhouette is  within normal limits for size. The visualized bony structures of the thorax are unremarkable. IMPRESSION: Low volume film without acute cardiopulmonary findings. Electronically Signed   By: Misty Stanley M.D.   On: 01/30/2023 08:37     LOS: 4 days   Signature  -    Lala Lund M.D on 01/31/2023 at 8:57 AM   -  To page go to [email or web address]

## 2023-02-01 ENCOUNTER — Inpatient Hospital Stay (HOSPITAL_COMMUNITY): Payer: 59

## 2023-02-01 DIAGNOSIS — I10 Essential (primary) hypertension: Secondary | ICD-10-CM | POA: Diagnosis not present

## 2023-02-01 DIAGNOSIS — R4182 Altered mental status, unspecified: Secondary | ICD-10-CM

## 2023-02-01 DIAGNOSIS — G9341 Metabolic encephalopathy: Secondary | ICD-10-CM | POA: Diagnosis not present

## 2023-02-01 LAB — ECHOCARDIOGRAM COMPLETE
AR max vel: 1.8 cm2
AV Area VTI: 2.28 cm2
AV Area mean vel: 2 cm2
AV Mean grad: 3 mmHg
AV Peak grad: 7.6 mmHg
Ao pk vel: 1.38 m/s
Area-P 1/2: 2.9 cm2
Height: 69 in
S' Lateral: 3.1 cm
Weight: 3232 oz

## 2023-02-01 LAB — BASIC METABOLIC PANEL
Anion gap: 7 (ref 5–15)
BUN: 9 mg/dL (ref 8–23)
CO2: 22 mmol/L (ref 22–32)
Calcium: 8.5 mg/dL — ABNORMAL LOW (ref 8.9–10.3)
Chloride: 111 mmol/L (ref 98–111)
Creatinine, Ser: 0.86 mg/dL (ref 0.61–1.24)
GFR, Estimated: >60 mL/min (ref >60)
Glucose, Bld: 91 mg/dL (ref 70–99)
Potassium: 3.5 mmol/L (ref 3.5–5.1)
Sodium: 140 mmol/L (ref 135–145)

## 2023-02-01 LAB — MAGNESIUM: Magnesium: 2 mg/dL (ref 1.7–2.4)

## 2023-02-01 LAB — CBC
HCT: 37.9 % — ABNORMAL LOW (ref 39.0–52.0)
Hemoglobin: 13.4 g/dL (ref 13.0–17.0)
MCH: 31.3 pg (ref 26.0–34.0)
MCHC: 35.4 g/dL (ref 30.0–36.0)
MCV: 88.6 fL (ref 80.0–100.0)
Platelets: 60 10*3/uL — ABNORMAL LOW (ref 150–400)
RBC: 4.28 MIL/uL (ref 4.22–5.81)
RDW: 14.5 % (ref 11.5–15.5)
WBC: 3 10*3/uL — ABNORMAL LOW (ref 4.0–10.5)
nRBC: 0 % (ref 0.0–0.2)

## 2023-02-01 LAB — GLUCOSE, CAPILLARY
Glucose-Capillary: 93 mg/dL (ref 70–99)
Glucose-Capillary: 100 mg/dL — ABNORMAL HIGH (ref 70–99)
Glucose-Capillary: 140 mg/dL — ABNORMAL HIGH (ref 70–99)
Glucose-Capillary: 119 mg/dL — ABNORMAL HIGH (ref 70–99)

## 2023-02-01 LAB — BRAIN NATRIURETIC PEPTIDE: B Natriuretic Peptide: 95.5 pg/mL (ref 0.0–100.0)

## 2023-02-01 LAB — C-REACTIVE PROTEIN: CRP: 0.5 mg/dL (ref <1.0)

## 2023-02-01 LAB — PROCALCITONIN: Procalcitonin: <0.1 ng/mL

## 2023-02-01 NOTE — Progress Notes (Signed)
EEG complete - results pending 

## 2023-02-01 NOTE — Plan of Care (Signed)

## 2023-02-01 NOTE — Procedures (Signed)
Patient Name: [name] [name]  MRN: [id number]  Epilepsy Attending: Lora Havens  Referring Physician/Provider: Otelia Santee, NP  Date: 02/01/2023 Duration:  22.14 mins  Patient history: 67 year old man with past medical history of migraines, prior stroke with no residual deficits, anxiety with acute onset of confusion, not feeling right and severe unremitting headache. EEG to evaluate for seizure  Level of alertness: Awake  AEDs during EEG study: ZNS  Technical aspects: This EEG study was done with scalp electrodes positioned according to the 10-20 International system of electrode placement. Electrical activity was reviewed with band pass filter of 1-70Hz , sensitivity of 7 uV/mm, display speed of 65mm/sec with a 60Hz  notched filter applied as appropriate. EEG data were recorded continuously and digitally stored.  Video monitoring was available and reviewed as appropriate.  Description: The posterior dominant rhythm consists of 8 Hz activity of moderate voltage (25-35 uV) seen predominantly in posterior head regions, symmetric and reactive to eye opening and eye closing. Hyperventilation and photic stimulation were not performed.     IMPRESSION: This study is within normal limits. No seizures or epileptiform discharges were seen throughout the recording.  A normal interictal EEG does not exclude nor support the diagnosis of epilepsy.  Blayne Frankie Barbra Sarks

## 2023-02-01 NOTE — Plan of Care (Signed)

## 2023-02-01 NOTE — Progress Notes (Signed)
Physical Therapy Treatment Patient Details Name: [name] [name] MRN: [id number] DOB: [birth year] Today's Date: 02/01/2023   History of Present Illness Patient is a 67 y.o.  male who presented on 3/20 with 1 day history of confusion and headaches.  MRI showing small cortically based foci of restricted diffusion at the frontoparietal vertex in both size with broad differentials including that of vasculitis, autoimmune cerebritis, infectious meningitis and atypical CJD along with atypical watershed infarctions and CNS lymphoma. LP pending. Demonstrated. 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 Pt with supervision for ambulation and transfers, no LOB but mildly decreased gait speed and cautious gait. Pt reports ambulating in his room without issue and reports no concerns with mobility at discharge, recommendations updated to no PT needs at this time. Acute PT will follow during admission to progress back to independence and insure safety with mobility.     Recommendations for follow up therapy are one component of a multi-disciplinary discharge planning process, led by the attending physician.  Recommendations may be updated based on patient status, additional functional criteria and insurance authorization.  Follow Up Recommendations       Assistance Recommended at Discharge PRN  Patient can return home with the following Assist for transportation;Direct supervision/assist for medications management   Equipment Recommendations  None recommended by PT    Recommendations for Other Services       Precautions / Restrictions Precautions Precautions: Fall Restrictions Weight Bearing Restrictions: No     Mobility  Bed Mobility Overal bed mobility: Needs Assistance Bed Mobility: Supine to  Sit     Supine to sit: Modified independent (Device/Increase time), HOB elevated     General bed mobility comments: modI with HOB elevated and use of bed rail    Transfers Overall transfer level: Needs assistance Equipment used: None Transfers: Sit to/from Stand Sit to Stand: Supervision           General transfer comment: supervision for line management and safety, pt standing without issue    Ambulation/Gait Ambulation/Gait assistance: Supervision Gait Distance (Feet): 250 Feet Assistive device: None Gait Pattern/deviations: Step-through pattern, Decreased stride length, Wide base of support Gait velocity: mildly decreased     General Gait Details: supervision for line management, balance improved and decreased path sway, pt reports ambulating in his room with no issue   Stairs             Wheelchair Mobility    Modified Rankin (Stroke Patients Only)       Balance Overall balance assessment: Needs assistance Sitting-balance support: No upper extremity supported, Feet supported Sitting balance-Leahy Scale: Good     Standing balance support: No upper extremity supported, During functional activity Standing balance-Leahy Scale: Fair Standing balance comment: no LOB, supervision for safety                            Cognition Arousal/Alertness: Awake/alert Behavior During Therapy: WFL for tasks assessed/performed Overall Cognitive Status: Impaired/Different from baseline Area of Impairment: Problem solving                             Problem Solving: Slow processing General Comments: Pt A&Ox4, reporting feeling closer to his baseline, requiring mildly  increased time for processing but seems improved from previous session        Exercises      General Comments General comments (skin integrity, edema, etc.): VSS on room air      Pertinent Vitals/Pain Pain Assessment Pain Assessment: No/denies pain    Home Living                           Prior Function            PT Goals (current goals can now be found in the care plan section) Acute Rehab PT Goals Patient Stated Goal: go home PT Goal Formulation: With patient/family Time For Goal Achievement: 02/13/23 Potential to Achieve Goals: Good Progress towards PT goals: Progressing toward goals    Frequency    Min 3X/week      PT Plan Current plan remains appropriate    Co-evaluation              AM-PAC PT "6 Clicks" Mobility   Outcome Measure  Help needed turning from your back to your side while in a flat bed without using bedrails?: None Help needed moving from lying on your back to sitting on the side of a flat bed without using bedrails?: None Help needed moving to and from a bed to a chair (including a wheelchair)?: A Little Help needed standing up from a chair using your arms (e.g., wheelchair or bedside chair)?: A Little Help needed to walk in hospital room?: A Little Help needed climbing 3-5 steps with a railing? : A Little 6 Click Score: 20    End of Session Equipment Utilized During Treatment: Gait belt Activity Tolerance: Patient tolerated treatment well Patient left: in chair;with call bell/phone within reach Nurse Communication: Mobility status PT Visit Diagnosis: Unsteadiness on feet (R26.81);Muscle weakness (generalized) (M62.81);Difficulty in walking, not elsewhere classified (R26.2)     Time: 1209-1223 PT Time Calculation (min) (ACUTE ONLY): 14 min  Charges:  $Gait Training: 8-22 mins                     Charlynne Cousins, PT DPT Acute Rehabilitation Services Office [phone]    [name] [name] 02/01/2023, 4:03 PM

## 2023-02-01 NOTE — Progress Notes (Signed)
PROGRESS NOTE        PATIENT DETAILS Name: [name] [name] Age: 67 y.o. Sex: male Date of Birth: Nov 15, 1955 Admit Date: 01/27/2023 Admitting Physician Orson Eva, MD [id number], Hazle Nordmann, MD  Brief Summary: Patient is a 67 y.o.  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  He was initially seen at [facility] emergency room-and transferred to [facility] for neurology evaluation.  Significant events: 3/20>> admit to [facility]  Significant studies: 3/20>> MRI brain: Small foci of restricted diffusion at the cortical frontoparietal vertex on both sides. 3/20>> CT angio head/neck: No LVO.  25% stenosis of both ICA bulb regions, 50% stenosis of right vertebral artery, 30% stenosis of left vertebral artery.  Significant microbiology data: None  Procedures: None  Consults: Neurology   Subjective: Patient in bed, appears comfortable, minimal headache this morning which has since resolved, no photophobia, no fever, no chest pain or pressure, no shortness of breath , no abdominal pain. No new focal weakness.     Objective: Vitals: Blood pressure 128/71, pulse 62, temperature 97.7 F (36.5 C), temperature source Oral, resp. rate 15, height 5\' 9"  (1.753 m), weight 91.6 kg, SpO2 94 %.   Exam:  Awake Alert, O x 3, No new F.N deficits, Normal affect [place].AT,PERRAL Supple Neck, No JVD,   Symmetrical Chest wall movement, Good air movement bilaterally, CTAB RRR,No Gallops, Rubs or new Murmurs,  +ve B.Sounds, Abd Soft, No tenderness,   No Cyanosis, Clubbing or edema   Assessment/Plan:  Intractable headache with metabolic encephalopathy Headache has resolved-after Decadron 10 mg IV on 3/21 Encephalopathy overall improved-but had some hallucinations last night Suspicion for meningoencephalitis-but differentials include vasculitis/autoimmune cerebritis etc.  given MRI brain findings. Remains on empiric broad-spectrum antibiotics to cover meningitis After Plavix washout LP was done on 01/30/2023 CSF looks reassuring not consistent with active infection, case discussed with neurologist on 01/31/2023, discontinue all antibiotics on 01/31/2023.  Fully overall his symptoms are improving, repeat MRI from 01/31/2023 noted discussed with neurology.  Repeat echocardiogram, pit panel to complete full stroke workup and follow CSF flow cytometry.  History of migraine headache/complex migraine Headache resolved after a dose of 10 mg Decadron yesterday.   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  CAD No anginal symptoms Continue Crestor/Coreg/Imdur  Chronic HFpEF Euvolemic Continue Farxiga  HLD Statin/Zetia  History of NASH liver cirrhosis Relatively compensated NH4 normal at time of admission  Thrombocytopenia Chronic issue Follows with hematology/oncology-unclear whether this is from cirrhosis (per hematology note).  He does have splenomegaly per CT scan February 2023.  Anxiety Celexa  DM-2 (A1c 7.8 09/29/2021) Monitor CBGs on SSI Continue Farxiga  Recent Labs    01/31/23 1531 01/31/23 2148 02/01/23 0732  GLUCAP 130* 129* 93      BMI: Estimated body mass index is 29.83 kg/m as calculated from the following:   Height as of this encounter: 5\' 9"  (1.753 m).   Weight as of this encounter: 91.6 kg.   Code status:   Code Status: Full Code   DVT Prophylaxis:IV heparinapixaban (ELIQUIS) tablet 5 mg  Family Communication: Spouse at bedside 01/30/23, 01/31/23   Disposition Plan: Status is: Inpatient Remains inpatient appropriate because: Severity of illness   Planned Discharge  Destination:Home   Diet: Diet Order             Diet Carb Modified Fluid consistency: Thin; Room service appropriate? Yes  Diet effective now                   MEDICATIONS: Scheduled Meds:  apixaban  5 mg Oral BID   carvedilol  3.125 mg Oral BID WC   citalopram  20 mg Oral QHS   dapagliflozin propanediol  5 mg Oral QAC breakfast   ezetimibe  10 mg Oral Daily   insulin aspart  0-5 Units Subcutaneous QHS   insulin aspart  0-9 Units Subcutaneous TID WC   isosorbide mononitrate  30 mg Oral QHS   isosorbide mononitrate  60 mg Oral Daily   melatonin  9 mg Oral QHS   mometasone-formoterol  2 puff Inhalation BID   pantoprazole  40 mg Oral Daily   rosuvastatin  20 mg Oral Daily   zonisamide  100 mg Oral Daily   Continuous Infusions:   PRN Meds:.acetaminophen **OR** acetaminophen, albuterol, HYDROcodone-acetaminophen, labetalol, naLOXone (NARCAN)  injection, ondansetron **OR** ondansetron (ZOFRAN) IV, mouth rinse   I have personally reviewed following labs and imaging studies  LABORATORY DATA:  Recent Labs  Lab 01/27/23 1008 01/27/23 1018 01/28/23 0427 01/29/23 0602 01/30/23 0622 01/31/23 0326 02/01/23 0653  WBC 5.1  --  4.7 5.2 2.9* 2.7* 3.0*  HGB 16.3   < > 14.2 13.7 12.9* 12.7* 13.4  HCT 45.5   < > 38.7* 38.4* 36.9* 36.8* 37.9*  PLT 85*  --  66* 61* 54* 58* 60*  MCV 87.5  --  87.0 87.7 89.6 89.5 88.6  MCH 31.3  --  31.9 31.3 31.3 30.9 31.3  MCHC 35.8  --  36.7* 35.7 35.0 34.5 35.4  RDW 14.7  --  14.6 14.1 14.6 14.6 14.5  LYMPHSABS 0.9  --   --   --   --   --   --   MONOABS 0.4  --   --   --   --   --   --   EOSABS 0.1  --   --   --   --   --   --   BASOSABS 0.0  --   --   --   --   --   --    < > = values in this interval not displayed.    Recent Labs  Lab 01/27/23 1008 01/27/23 1018 01/27/23 1052 01/28/23 0427 01/29/23 0602 01/30/23 0622 01/31/23 0326 01/31/23 0953 02/01/23 0653  NA 137 142  --  137 139  --   --  139 140  K 3.7 3.9  --  3.2*  3.4*  --   --  4.2 3.5  CL 106 108  --  109 110  --   --  112* 111  CO2 20*  --   --  18* 17*  --   --  20* 22  ANIONGAP 11  --   --  10 12  --   --  7 7  GLUCOSE 155* 155*  --  103* 160*  --   --  110* 91  BUN 15 15  --  9 9  --   --  10 9  CREATININE 0.97 1.00  --  0.87 0.95  --   --  0.91 0.86  AST 33  --   --   --   --   --   --   --   --   ALT 26  --   --   --   --   --   --   --   --   ALKPHOS 52  --   --   --   --   --   --   --   --   BILITOT 1.3*  --   --   --   --   --   --   --   --   ALBUMIN 3.7  --   --   --   --   --   --   --   --   CRP  --   --   --   --   --  <0.5 0.5  --  0.5  PROCALCITON  --   --   --   --   --  <0.10 <0.10  --   --   INR 1.6*  --   --   --   --   --   --   --   --   TSH 1.077  --   --   --   --   --   --   --   --   HGBA1C 5.9*  --   --   --   --   --   --   --   --   AMMONIA  --   --  18  --   --   --   --   --   --   BNP  --   --   --   --   --  119.2* 117.3*  --  95.5  MG  --   --   --   --   --  1.6* 2.2  --  2.0  CALCIUM 9.0  --   --  8.3* 8.6*  --   --  8.3* 8.5*      Recent Labs  Lab 01/27/23 1008 01/27/23 1052 01/28/23 0427 01/29/23 0602 01/30/23 0622 01/31/23 0326 01/31/23 0953 02/01/23 0653  CRP  --   --   --   --  <0.5 0.5  --  0.5  PROCALCITON  --   --   --   --  <0.10 <0.10  --   --   INR 1.6*  --   --   --   --   --   --   --   TSH 1.077  --   --   --   --   --   --   --   HGBA1C 5.9*  --   --   --   --   --   --   --   AMMONIA  --  18  --   --   --   --   --   --   BNP  --   --   --   --  119.2* 117.3*  --  95.5  MG  --   --   --   --  1.6* 2.2  --  2.0  CALCIUM 9.0  --  8.3* 8.6*  --   --  8.3* 8.5*    Lab Results  Component Value Date   CHOL 108 09/29/2022   HDL 32 (L) 09/29/2022   LDLCALC  55 09/29/2022   TRIG 112 09/29/2022   CHOLHDL 3.4 09/29/2022   Lab Results  Component Value Date   HGBA1C 5.9 (H) 01/27/2023    RADIOLOGY STUDIES/RESULTS: MR BRAIN W WO CONTRAST  Result Date: 01/31/2023 CLINICAL DATA:   67 year old male with unrelenting headache, now improved. Code stroke presentation on 01/27/2023. Subtle but scattered and bilateral cortical restricted diffusion, signal abnormality on brain MRI at that time. Patient is status post fluoroscopic guided lumbar puncture yesterday with CSF negative for infection, protein mildly elevated. Query complex migraine. History also of cirrhosis, "Arthritis", diabetes. EXAM: MRI HEAD WITHOUT AND WITH CONTRAST TECHNIQUE: Multiplanar, multiecho pulse sequences of the brain and surrounding structures were obtained without and with intravenous contrast. CONTRAST:  74mL GADAVIST GADOBUTROL 1 MMOL/ML IV SOLN COMPARISON:  Noncontrast MRI 01/27/2023. MRV 01/28/2023. CTA head and neck 01/27/2023. FINDINGS: Brain: Ongoing abnormal bilateral vertex (superior frontal and superior parietal lobe) asymmetric scattered foci of cortical abnormal diffusion (series 5, image 94) with virtually the exact same areas affected as 4 days ago. No definite brand new areas of involvement are identified. There are several more inferiorly located lesions, including the lateral right central sulcus series 5, image 90, inferior left parietal lobe image 87, right inferior parietal lobe image 85. Furthermore, there might also be left-side unilateral posterior temporal and/or lateral occipital lobe cortex involvement such as on series 5, images 76 and 74. Interestingly, and as before most of these areas are not apparent on T2/FLAIR, although there is subtle correlation today at the vertex on series 11, image 22. Following contrast, there is no cortical enhancement. And also no convincing leptomeningeal or pachymeningeal thickening or enhancement. Also, no evidence of mineralization or hemosiderin on SWI. No abnormal enhancement identified elsewhere in the brain. And otherwise stable gray and white matter signal. Nonspecific mild for age small white matter T2 and FLAIR hyperintense foci. Tiny chronic cerebellar  lacunar infarcts again suggested. No superimposed no midline shift, mass effect, evidence of mass lesion, ventriculomegaly, extra-axial collection or acute intracranial hemorrhage. Cervicomedullary junction and pituitary are within normal limits. Vascular: Major intracranial vascular flow voids are stable. Following contrast the major dural venous sinuses are enhancing and appear to be patent. Skull and upper cervical spine: Negative for age visible cervical spine and spinal cord. Visualized bone marrow signal is within normal limits. Sinuses/Orbits: Bulky but stable sphenoid paranasal sinus disease with only mild sinus mucosal thickening elsewhere. Chronic postoperative changes to both globes. Other: Mastoid air cells remain well aerated. Visible internal auditory structures appear normal. Visible scalp and face appear negative. IMPRESSION: 1. Unresolved unusual cortical diffusion abnormality, primarily bilaterally at the vertex but perhaps also subtle involvement lower down such as left temporal lobe. As before there is no significant gyral edema, very little correlation on T2/FLAIR. And there is no enhancement following contrast today, no evidence of an overlying meningeal abnormality following contrast. No definite brand new areas of involvement. 2. Otherwise stable MRI appearance of the brain. 3. This was discussed by telephone with Dr. Amie Portland on 01/31/2023 at 12:38. 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 Also lack of enhancement would argue against infectious or inflammatory encephalitis. Large B-cell lymphoma remains possible, CSF cytology is pending. Hepatic encephalopathy according to the literature can rarely present with a cortical pattern of signal abnormality. Dr. Rory Percy advised this patient does Not have clinical symptoms of CJD. Electronically Signed   By: Genevie Ann M.D.   On:  01/31/2023 12:41   DG FL GUIDED LUMBAR  PUNCTURE  Result Date: 01/30/2023 CLINICAL DATA:  67 year old male with complaint of AMS, encephalopathy. EXAM: LUMBAR PUNCTURE UNDER FLUOROSCOPY PROCEDURE: An appropriate skin entry site was determined fluoroscopically. Operator donned sterile gloves and mask. Skin site was marked, then prepped with Betadine, draped in usual sterile fashion, and infiltrated locally with 1% lidocaine. A 20 gauge spinal needle advanced into the thecal sac at L3-L4 from a right interlaminar approach. Clear colorless CSF spontaneously returned, with opening pressure of 21 cm water. 13 ml CSF were collected and divided among 4 sterile vials for the requested laboratory studies. The needle was then removed and the skin cleansed and bandaged. The patient tolerated the procedure well. No immediate complication. FLUOROSCOPY: Radiation Exposure Index (as provided by the fluoroscopic device): 15.6 mGy Kerma IMPRESSION: Technically successful lumbar puncture under fluoroscopy. This exam was performed by Brynda Greathouse PA-C, and was supervised and interpreted by Van Clines, MD. Electronically Signed   By: Van Clines M.D.   On: 01/30/2023 15:12     LOS: 5 days   Signature  -    Lala Lund M.D on 02/01/2023 at 9:04 AM   -  To page go to [email or web address]

## 2023-02-01 NOTE — Progress Notes (Addendum)
STROKE TEAM PROGRESS NOTE   INTERVAL HISTORY No family is at the bedside.   Patient presented 3/24 with an acute confusion/not feeling right/severe unremitting headache 10/10 since Tuesday a.m.  Patient describes his headache as a 10 out of 10 that started out of nowhere with increase in pain brought on by lights and sound.   MRI showed area of restricted diffusion at the frontoparietal vertex with broad differentials- RCVS most likely,CJD less likely given clinical scenario.  Embolic strokes possible though less likely MRV normal.  LP completed under fluoroscopy with unremarkable CSF findings. On assessment today symptoms have completely resolved.  EEG negative. Stroke workup continuing. Pending PT/OT recommendation for disposition.  Patient back on home Eliquis.    Vitals:   02/01/23 0733 02/01/23 0820 02/01/23 1041 02/01/23 1147  BP: 128/71  133/72 (!) 140/74  Pulse: 62  61 61  Resp: 15   17  Temp: 97.7 F (36.5 C)   98.3 F (36.8 C)  TempSrc: Oral   Oral  SpO2:  94%  96%  Weight:      Height:       CBC:  Recent Labs  Lab 01/27/23 1008 01/27/23 1018 01/31/23 0326 02/01/23 0653  WBC 5.1   < > 2.7* 3.0*  NEUTROABS 3.7  --   --   --   HGB 16.3   < > 12.7* 13.4  HCT 45.5   < > 36.8* 37.9*  MCV 87.5   < > 89.5 88.6  PLT 85*   < > 58* 60*   < > = values in this interval not displayed.   Basic Metabolic Panel:  Recent Labs  Lab 01/31/23 0326 01/31/23 0953 02/01/23 0653  NA  --  139 140  K  --  4.2 3.5  CL  --  112* 111  CO2  --  20* 22  GLUCOSE  --  110* 91  BUN  --  10 9  CREATININE  --  0.91 0.86  CALCIUM  --  8.3* 8.5*  MG 2.2  --  2.0   Lipid Panel: No results for input(s): "CHOL", "TRIG", "HDL", "CHOLHDL", "VLDL", "LDLCALC" in the last 168 hours. HgbA1c:  Recent Labs  Lab 01/27/23 1008  HGBA1C 5.9*   Urine Drug Screen:  Recent Labs  Lab 01/27/23 1316  LABOPIA NONE DETECTED  COCAINSCRNUR NONE DETECTED  LABBENZ NONE DETECTED  AMPHETMU NONE DETECTED   THCU NONE DETECTED  LABBARB NONE DETECTED    Alcohol Level  Recent Labs  Lab 01/27/23 1008  ETH <10    IMAGING past 24 hours EEG adult  Result Date: 02/01/2023 Lora Havens, MD     02/01/2023 12:49 PM Patient Name: [name] [name] MRN: [id number] Epilepsy Attending: Lora Havens Referring Physician/Provider: Otelia Santee, NP Date: 02/01/2023 Duration:  22.14 mins Patient history: 67 year old man with past medical history of migraines, prior stroke with no residual deficits, anxiety with acute onset of confusion, not feeling right and severe unremitting headache. EEG to evaluate for seizure Level of alertness: Awake AEDs during EEG study: ZNS Technical aspects: This EEG study was done with scalp electrodes positioned according to the 10-20 International system of electrode placement. Electrical activity was reviewed with band pass filter of 1-70Hz , sensitivity of 7 uV/mm, display speed of 66mm/sec with a 60Hz  notched filter applied as appropriate. EEG data were recorded continuously and digitally stored.  Video monitoring was available and reviewed as appropriate. Description: The posterior dominant rhythm consists of 8 Hz activity  of moderate voltage (25-35 uV) seen predominantly in posterior head regions, symmetric and reactive to eye opening and eye closing. Hyperventilation and photic stimulation were not performed.   IMPRESSION: This study is within normal limits. No seizures or epileptiform discharges were seen throughout the recording. A normal interictal EEG does not exclude nor support the diagnosis of epilepsy. Priyanka Barbra Sarks    PHYSICAL EXAM  Temp:  [97.7 F (36.5 C)-98.3 F (36.8 C)] 97.8 F (36.6 C) (03/25 1552) Pulse Rate:  [58-64] 63 (03/25 1552) Resp:  [10-28] 15 (03/25 1552) BP: (104-143)/(51-74) 119/71 (03/25 1552) SpO2:  [94 %-98 %] 98 % (03/25 1552)   General awake alert in no distress HEENT: Normocephalic atraumatic  CVs: Regular rhythm  Neuro Awake  alert oriented x 3 No dysarthria No aphasia Cranial nerves 2-12 intact Motor examination with no drift Sensation intact to light touch Coordination exam with no dysmetria   ASSESSMENT/PLAN [name] [name] is a 67 year old man with past medical history of migraines, prior stroke with no residual deficits, anxiety with acute onset of confusion, not feeling right and severe unremitting headache with imaging by MRI showing small cortically based foci of restricted diffusion at the frontoparietal vertex in both size with broad differentials including that of vasculitis, autoimmune cerebritis, infectious meningitis etc.. MRV normal. LP completed under fluoroscopy-unremarkable CSF findings Headache has resolved.  Mental status has improved nearly to baseline.  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  Would recommend repeat MRI in 4 to 6 weeks  Code Stroke CT head: No acute abnormality. ASPECTS 10.    CTA head & neck: No LVO. 50% stenosis of right vertebral artery, 30% stenosis of left vertebral artery.  MRI /wo 3/24: Unresolved unusual cortical diffusion abnormality, primarily bilaterally at the vertex but perhaps also subtle involvement lower down such as left temporal lobe. As before there is no significant gyral edema, very little correlation on T2/[name]. And there is no enhancement following contrast today, no evidence of an overlying meningeal abnormality following contrast. No definite brand new areas of involvement MRI 3/20: Small foci of restricted diffusion at the cortical frontoparietal vertex on both sides, slightly more prominent on the right than the left. No large confluent infarction. The finding would be unusual for central origin micro embolic infarctions. See above differential discussion, which includes a wide range of other potential brain insults. MRV: Normal  noncontrast MRV head with no evidence of venous sinus thrombosis TCD with Bubble Study: pending 2D Echo: Ejection fraction 55 to 60%.   US Venous LE: pending LDL 55 HgbA1c 5.9 VTE prophylaxis - eleiquis    Diet   Diet Carb Modified Fluid consistency: Thin; Room service appropriate? Yes   Eliquis (apixaban) daily prior to admission, now on Eliquis (apixaban) daily. Recommend continuing Eliquis on discharge.  Therapy recommendations:  Home with assistance Disposition:  pending  Hypertension Home meds: Coreg 3.125 mg Stable Permissive hypertension (OK if < 220/120) but gradually normalize in 5-7 days Long-term BP goal normotensive  Hyperlipidemia Home meds: Crestor 20 mg, Zetia 10 mg resumed in hospital LDL 55, goal < 70 Continue statin at discharge  Diabetes type II Controlled Home meds: [name], metformin, Farxiga HgbA1c 5.9, goal < 7.0 CBGs Recent Labs    01/31/23 2148 02/01/23 0732 02/01/23 1146  GLUCAP 129* 93 100*    SSI  Other Stroke Risk Factors Obesity, Body mass index is 29.83 kg/m., BMI >/= 30 associated with  increased stroke risk, recommend weight loss, diet and exercise as appropriate  Hx stroke/TIA Family hx stroke (mother) Migraines Obstructive sleep apnea, on CPAP at home CAD Heart Failure  Other Active Problems Chronic Thrombocytopenia Platelets 66->61->54->58->60 Hypokalemia/Hypomagnesia 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 Note/plan to be edited by MD as needed.    Otelia Santee, DNP, AGACNP-BC [facility] Please use AMION for pager and EPIC for messaging  I have personally obtained history,examined this patient, reviewed notes, independently viewed imaging studies, participated in medical decision making and plan of care.ROS completed by me personally and pertinent positives fully documented  I have made any additions or  clarifications directly to the above note. Agree with note above.  Interesting case.  Patient presented with sudden severe headache with confusion which appears to have cleared and is back to his baseline.  MRI scan done 4 days ago as well as 1 repeated yesterday shows faint diffusion positive bilateral cortical lesions and neuroradiologist as strongly suggested CJD however clinical presentation is not compatible with that.RCVS seems the most likely.  Bilateral embolic strokes possible though less likely since patient was already on Eliquis..  Recommend check TCD bubble study for right-to-left shunt and lower extremity venous Dopplers for DVT.  Long discussion with patient and answered questions.  Discussed with Dr. Candiss Norse.  Greater than 50% time during this 50-minute visit was spent in counseling and coordination of care about his headache and abnormal MRI and answering questions.  Antony Contras, MD Medical Director [facility] Pager: [phone] 02/01/2023 5:29 PM  To contact Stroke Continuity provider, please refer to [email or web address] After hours, contact General Neurology

## 2023-02-01 NOTE — Progress Notes (Signed)
   Echocardiogram 2D Echocardiogram has been performed.  [name] [name] 02/01/2023, 3:13 PM

## 2023-02-02 ENCOUNTER — Inpatient Hospital Stay (HOSPITAL_COMMUNITY): Payer: 59

## 2023-02-02 DIAGNOSIS — I639 Cerebral infarction, unspecified: Secondary | ICD-10-CM

## 2023-02-02 DIAGNOSIS — G9341 Metabolic encephalopathy: Secondary | ICD-10-CM | POA: Diagnosis not present

## 2023-02-02 LAB — HSV 1/2 PCR, CSF
HSV-1 DNA: NEGATIVE
HSV-2 DNA: NEGATIVE

## 2023-02-02 LAB — C-REACTIVE PROTEIN: CRP: <0.5 mg/dL (ref <1.0)

## 2023-02-02 LAB — CBC
HCT: 37.3 % — ABNORMAL LOW (ref 39.0–52.0)
Hemoglobin: 13.6 g/dL (ref 13.0–17.0)
MCH: 31.9 pg (ref 26.0–34.0)
MCHC: 36.5 g/dL — ABNORMAL HIGH (ref 30.0–36.0)
MCV: 87.4 fL (ref 80.0–100.0)
Platelets: 62 10*3/uL — ABNORMAL LOW (ref 150–400)
RBC: 4.27 MIL/uL (ref 4.22–5.81)
RDW: 14.6 % (ref 11.5–15.5)
WBC: 3.1 10*3/uL — ABNORMAL LOW (ref 4.0–10.5)
nRBC: 0 % (ref 0.0–0.2)

## 2023-02-02 LAB — BASIC METABOLIC PANEL
Anion gap: 9 (ref 5–15)
BUN: 11 mg/dL (ref 8–23)
CO2: 18 mmol/L — ABNORMAL LOW (ref 22–32)
Calcium: 8.7 mg/dL — ABNORMAL LOW (ref 8.9–10.3)
Chloride: 108 mmol/L (ref 98–111)
Creatinine, Ser: 0.77 mg/dL (ref 0.61–1.24)
GFR, Estimated: >60 mL/min (ref >60)
Glucose, Bld: 106 mg/dL — ABNORMAL HIGH (ref 70–99)
Potassium: 3.6 mmol/L (ref 3.5–5.1)
Sodium: 135 mmol/L (ref 135–145)

## 2023-02-02 LAB — LIPID PANEL
Cholesterol: 87 mg/dL (ref 0–200)
HDL: 32 mg/dL — ABNORMAL LOW (ref >40)
LDL Cholesterol: 36 mg/dL (ref 0–99)
Total CHOL/HDL Ratio: 2.7 RATIO
Triglycerides: 96 mg/dL (ref <150)
VLDL: 19 mg/dL (ref 0–40)

## 2023-02-02 LAB — CYTOLOGY - NON PAP

## 2023-02-02 LAB — MAGNESIUM: Magnesium: 1.9 mg/dL (ref 1.7–2.4)

## 2023-02-02 LAB — BRAIN NATRIURETIC PEPTIDE: B Natriuretic Peptide: 49 pg/mL (ref 0.0–100.0)

## 2023-02-02 LAB — PROCALCITONIN: Procalcitonin: <0.1 ng/mL

## 2023-02-02 LAB — GLUCOSE, CAPILLARY: Glucose-Capillary: 131 mg/dL — ABNORMAL HIGH (ref 70–99)

## 2023-02-02 MED ORDER — ACETAMINOPHEN 500 MG PO TABS: 500.0000 mg | ORAL_TABLET | Freq: Three times a day (TID) | ORAL | 0 refills | Status: DC | PRN

## 2023-02-02 NOTE — Discharge Instructions (Signed)
Follow with Primary MD [name] Abraham, MD in 7 days, follow-up on CSF flow cytometry results along with final CSF culture results.  Get CBC, CMP, magnesium-  checked next visit with your primary MD   Activity: As tolerated with Full fall precautions use walker/cane & assistance as needed  Disposition Home   Diet: Heart Healthy Low Carb, check CBGs q. ACH S.  Special Instructions: If you have smoked or chewed Tobacco  in the last 2 yrs please stop smoking, stop any regular Alcohol  and or any Recreational drug use.  On your next visit with your primary care physician please Get Medicines reviewed and adjusted.  Please request your Prim.MD to go over all Hospital Tests and Procedure/Radiological results at the follow up, please get all Hospital records sent to your Prim MD by signing hospital release before you go home.  If you experience worsening of your admission symptoms, develop shortness of breath, life threatening emergency, suicidal or homicidal thoughts you must seek medical attention immediately by calling 911 or calling your MD immediately  if symptoms less severe.  You Must read complete instructions/literature along with all the possible adverse reactions/side effects for all the Medicines you take and that have been prescribed to you. Take any new Medicines after you have completely understood and accpet all the possible adverse reactions/side effects.

## 2023-02-02 NOTE — Progress Notes (Addendum)
STROKE TEAM PROGRESS NOTE   INTERVAL HISTORY No family is at the bedside.   Patient completed stroke workup with negative TCD bubble study and a lower extremity DVT scan today.  He is cleared for discharge from neurological standpoint. Patient back on home Eliquis.  Will need to follow-up with neurology in 6 to 8 weeks.  Vitals:   02/01/23 1959 02/02/23 0025 02/02/23 0438 02/02/23 0817  BP: 129/71 125/69 115/67 117/66  Pulse: 64 95 66 63  Resp: 12 17 15 15   Temp: 97.6 F (36.4 C) 97.9 F (36.6 C) 97.8 F (36.6 C) 98 F (36.7 C)  TempSrc: Oral Oral Oral Oral  SpO2: 97% 96% 97% 99%  Weight:      Height:       CBC:  Recent Labs  Lab 01/27/23 1008 01/27/23 1018 02/01/23 0653 02/02/23 0555  WBC 5.1   < > 3.0* 3.1*  NEUTROABS 3.7  --   --   --   HGB 16.3   < > 13.4 13.6  HCT 45.5   < > 37.9* 37.3*  MCV 87.5   < > 88.6 87.4  PLT 85*   < > 60* 62*   < > = values in this interval not displayed.    Basic Metabolic Panel:  Recent Labs  Lab 02/01/23 0653 02/02/23 0555  NA 140 135  K 3.5 3.6  CL 111 108  CO2 22 18*  GLUCOSE 91 106*  BUN 9 11  CREATININE 0.86 0.77  CALCIUM 8.5* 8.7*  MG 2.0 1.9    Lipid Panel:  Recent Labs  Lab 02/02/23 0555  CHOL 87  TRIG 96  HDL 32*  CHOLHDL 2.7  VLDL 19  LDLCALC 36   HgbA1c:  Recent Labs  Lab 01/27/23 1008  HGBA1C 5.9*    Urine Drug Screen:  Recent Labs  Lab 01/27/23 1316  LABOPIA NONE DETECTED  COCAINSCRNUR NONE DETECTED  LABBENZ NONE DETECTED  AMPHETMU NONE DETECTED  THCU NONE DETECTED  LABBARB NONE DETECTED     Alcohol Level  Recent Labs  Lab 01/27/23 1008  ETH <10     IMAGING past 24 hours VAS [place] TRANSCRANIAL DOPPLER W BUBBLES  Result Date: 02/02/2023  Transcranial Doppler with Bubble Patient Name:  [name] [name]  Date of Exam:   02/02/2023 Medical Rec #: [id number]        Accession #:    [id number] Date of Birth: Apr 25, 1956        Patient Gender: M Patient Age:   67 years Exam Location:  [facility] Procedure:      VAS [place] TRANSCRANIAL DOPPLER W BUBBLES Referring Phys: Deno Etienne [facility] --------------------------------------------------------------------------------  Indications: Stroke. Performing Technologist: Darlin Coco RDMS, RVT  Examination Guidelines: A complete evaluation includes B-mode imaging, spectral Doppler, color Doppler, and power Doppler as needed of all accessible portions of each vessel. Bilateral testing is considered an integral part of a complete examination. Limited examinations for reoccurring indications may be performed as noted.  Summary: No HITS at rest or during Valsalva. Negative transcranial Doppler Bubble study with no evidence of right to left intracardiac communication.  A vascular evaluation was performed. The left middle cerebral artery was studied. An IV was inserted into the patient's left forearm. Verbal informed consent was obtained.  Less than 10 high intensity transient signals observed with valsalva, indicating a Spendcer Grade 1 patent foramen ovale with valsalva. No HITS observed at rest. *See table(s) above for TCD measurements and observations.  Preliminary    VAS [place] LOWER EXTREMITY VENOUS (DVT)  Result Date: 02/02/2023  Lower Venous DVT Study Patient Name:  [name] [name]  Date of Exam:   02/02/2023 Medical Rec #: [id number]        Accession #:    [id number] Date of Birth: [birth year]        Patient Gender: M Patient Age:   67 years Exam Location:  [facility] Procedure:      VAS [place] LOWER EXTREMITY VENOUS (DVT) Referring Phys: Deno Etienne [facility] --------------------------------------------------------------------------------  Indications: Stroke. Other Indications: Recent history of PE/DVT. Comparison Study: 12-27-2022 Prior bilateral lower extremity venous study was                   positive for LEFT profunda vein DVT. Performing Technologist: Darlin Coco RDMS, RVT  Examination Guidelines: A complete evaluation includes B-mode imaging,  spectral Doppler, color Doppler, and power Doppler as needed of all accessible portions of each vessel. Bilateral testing is considered an integral part of a complete examination. Limited examinations for reoccurring indications may be performed as noted. The reflux portion of the exam is performed with the patient in reverse Trendelenburg.  +---------+---------------+---------+-----------+----------+--------------+ RIGHT    CompressibilityPhasicitySpontaneityPropertiesThrombus Aging +---------+---------------+---------+-----------+----------+--------------+ CFV      Full           Yes      Yes                                 +---------+---------------+---------+-----------+----------+--------------+ SFJ      Full                                                        +---------+---------------+---------+-----------+----------+--------------+ FV Prox  Full                                                        +---------+---------------+---------+-----------+----------+--------------+ FV Mid   Full                                                        +---------+---------------+---------+-----------+----------+--------------+ FV DistalFull                                                        +---------+---------------+---------+-----------+----------+--------------+ PFV      Full                                                        +---------+---------------+---------+-----------+----------+--------------+ POP      Full           Yes      Yes                                 +---------+---------------+---------+-----------+----------+--------------+  PTV      Full                                                        +---------+---------------+---------+-----------+----------+--------------+ PERO     Full                                                        +---------+---------------+---------+-----------+----------+--------------+ Gastroc   Full                                                        +---------+---------------+---------+-----------+----------+--------------+   +---------+---------------+---------+-----------+----------+--------------+ LEFT     CompressibilityPhasicitySpontaneityPropertiesThrombus Aging +---------+---------------+---------+-----------+----------+--------------+ CFV      Full           Yes      Yes                                 +---------+---------------+---------+-----------+----------+--------------+ SFJ      Full                                                        +---------+---------------+---------+-----------+----------+--------------+ FV Prox  Full                                                        +---------+---------------+---------+-----------+----------+--------------+ FV Mid   Full                                                        +---------+---------------+---------+-----------+----------+--------------+ FV DistalFull                                                        +---------+---------------+---------+-----------+----------+--------------+ PFV      Full                                                        +---------+---------------+---------+-----------+----------+--------------+ POP      Full           Yes      Yes                                 +---------+---------------+---------+-----------+----------+--------------+  PTV      Full                                                        +---------+---------------+---------+-----------+----------+--------------+ PERO     Full                                                        +---------+---------------+---------+-----------+----------+--------------+ Gastroc  Full                                                        +---------+---------------+---------+-----------+----------+--------------+    Summary: RIGHT: - There is no evidence of deep vein  thrombosis in the lower extremity.  - No cystic structure found in the popliteal fossa.  LEFT: - Findings appear improved from previous examination.  - There is no evidence of deep vein thrombosis in the lower extremity.  - No cystic structure found in the popliteal fossa.  *See table(s) above for measurements and observations.    Preliminary     PHYSICAL EXAM  Temp:  [97.6 F (36.4 C)-98 F (36.7 C)] 98 F (36.7 C) (03/26 0817) Pulse Rate:  [63-95] 63 (03/26 0817) Resp:  [12-17] 15 (03/26 0817) BP: (115-132)/(66-71) 117/66 (03/26 0817) SpO2:  [96 %-99 %] 99 % (03/26 0817)   General awake alert in no distress HEENT: Normocephalic atraumatic  CVs: Regular rhythm  Neuro Awake alert oriented x 3 No dysarthria No aphasia Cranial nerves 2-12 intact Motor examination with no drift Sensation intact to light touch Coordination exam with no dysmetria   ASSESSMENT/PLAN Mr. KREGG FALLERT is a 67 year old man with past medical history of migraines, prior stroke with no residual deficits, anxiety with acute onset of confusion, not feeling right and severe unremitting headache with imaging by MRI showing small cortically based foci of restricted diffusion at the frontoparietal vertex in both size with broad differentials including that of vasculitis, autoimmune cerebritis, infectious meningitis etc.. MRV normal. LP completed under fluoroscopy-unremarkable CSF findings Headache has resolved.  Mental status has improved nearly to baseline.  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  Would recommend repeat MRI in 4 to 6 weeks  Code Stroke CT head: No acute abnormality. ASPECTS 10.    CTA head & neck: No LVO. 50% stenosis of right vertebral artery, 30% stenosis of left vertebral artery.  MRI /wo 3/24: Unresolved unusual cortical diffusion abnormality, primarily bilaterally at the  vertex but perhaps also subtle involvement lower down such as left temporal lobe. As before there is no significant gyral edema, very little correlation on T2/FLAIR. And there is no enhancement following contrast today, no evidence of an overlying meningeal abnormality following contrast. No definite brand new areas of involvement MRI 3/20: Small foci of restricted diffusion at the cortical frontoparietal vertex on both sides, slightly more prominent on the right than the left. No large confluent infarction. The finding would be unusual for central origin micro embolic  infarctions. See above differential discussion, which includes a wide range of other potential brain insults. MRV: Normal noncontrast MRV head with no evidence of venous sinus thrombosis TCD with Bubble Study: pending 2D Echo: Ejection fraction 55 to 60%.   US Venous LE: Negative TCD: No HITS at rest or during Valsalva. Negative transcranial Doppler Bubble  study with no evidence of right to left intracardiac communication.   LDL 55 HgbA1c 5.9 VTE prophylaxis - eleiquis    Diet   Diet Carb Modified Fluid consistency: Thin; Room service appropriate? Yes   Eliquis (apixaban) daily prior to admission, now on Eliquis (apixaban) daily. Recommend continuing Eliquis on discharge.  Therapy recommendations:  Home with assistance Disposition:  pending  Hypertension Home meds: Coreg 3.125 mg Stable Permissive hypertension (OK if < 220/120) but gradually normalize in 5-7 days Long-term BP goal normotensive  Hyperlipidemia Home meds: Crestor 20 mg, Zetia 10 mg resumed in hospital LDL 55, goal < 70 Continue statin at discharge  Diabetes type II Controlled Home meds: [name], metformin, Farxiga HgbA1c 5.9, goal < 7.0 CBGs Recent Labs    02/01/23 1550 02/01/23 2134 02/02/23 1550  GLUCAP 140* 119* 131*     SSI  Other Stroke Risk Factors Obesity, Body mass index is 29.83 kg/m., BMI >/= 30 associated with increased stroke  risk, recommend weight loss, diet and exercise as appropriate  Hx stroke/TIA Family hx stroke (mother) Migraines Obstructive sleep apnea, on CPAP at home CAD Heart Failure  Other Active Problems Chronic Thrombocytopenia Platelets 66->61->54->58->60 Hypokalemia/Hypomagnesia 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 Follow-up with outpatient neurology in 8 weeks.    Pt seen by Neuro NP/APP and later by MD. Note/plan to be edited by MD as needed.    Otelia Santee, DNP, AGACNP-BC [facility] Please use AMION for pager and EPIC for messaging  I have personally obtained history,examined this patient, reviewed notes, independently viewed imaging studies, participated in medical decision making and plan of care.ROS completed by me personally and pertinent positives fully documented  I have made any additions or clarifications directly to the above note. Agree with note above.  TCD bubble study done at the bedside showed only a trivial right to left shunt after Valsalva only unlikely to be of clinical significance.  Patient will follow-up in the clinic in 2 months and may consider repeating MRI then or sooner if he has recurrent headaches or neurological symptoms.  Discussed with patient and Dr. Candiss Norse.  Greater than 50% time during this 35-minute visit with spent on counseling and coordination of care and discussion patient and care team and answering questions.  Antony Contras, MD Medical Director [facility] Pager: [phone] 02/02/2023 5:27 PM    To contact Stroke Continuity provider, please refer to [email or web address] After hours, contact General Neurology

## 2023-02-02 NOTE — Plan of Care (Signed)

## 2023-02-02 NOTE — Progress Notes (Signed)
Lower extremity venous bilateral and TCD w/ bubble study completed.   Please see CV Proc for preliminary results.   Leisl Spurrier, RDMS, RVT  

## 2023-02-02 NOTE — Progress Notes (Signed)
Occupational Therapy Treatment and Discharge Patient Details Name: CYREE HILINSKI MRN: [id number] DOB: 08-Apr-1956 Today's Date: 02/02/2023   History of present illness Patient is a 67 y.o.  male who presented on 3/20 with 1 day history of confusion and headaches.  MRI showing small cortically based foci of restricted diffusion at the frontoparietal vertex in both size with broad differentials including that of vasculitis, autoimmune cerebritis, infectious meningitis and atypical CJD along with atypical watershed infarctions and CNS lymphoma (still pending definite diagnosis 3/26) LP pending. Demonstrated. PMHx: CAD s/p multiple PCI's and CABG in 2017, HFpEF, VTE (PE February 2024), thrombocytopenia   OT comments  This 67 yo male is now a an independent/baseline level. Pt did have a goal of medication management that was not tested, but based off of interaction and no A level needed at this time do not feel this will be an issue for pt and pt has wife at home to oversee medication. No further skilled OT needs, we will sign off.   Recommendations for follow up therapy are one component of a multi-disciplinary discharge planning process, led by the attending physician.  Recommendations may be updated based on patient status, additional functional criteria and insurance authorization.    Assistance Recommended at Discharge None     Equipment Recommendations  None recommended by OT       Precautions / Restrictions Precautions Precautions: None Restrictions Weight Bearing Restrictions: No       Mobility Bed Mobility Overal bed mobility: Independent                  Transfers Overall transfer level: Independent                       Balance Overall balance assessment: Independent                                         ADL either performed or assessed with clinical judgement   ADL Overall ADL's : Independent                                             Extremity/Trunk Assessment Upper Extremity Assessment Upper Extremity Assessment: Overall WFL for tasks assessed            Vision Patient Visual Report: No change from baseline            Cognition Arousal/Alertness: Awake/alert Behavior During Therapy: WFL for tasks assessed/performed Overall Cognitive Status: Within Functional Limits for tasks assessed                                                General Comments VSS on RA    Pertinent Vitals/ Pain       Pain Assessment Pain Assessment: Faces Faces Pain Scale: Hurts a little bit Pain Location: left knee (scab from fall) Pain Descriptors / Indicators: Sore Pain Intervention(s): Monitored during session            Progress Toward Goals  OT Goals(current goals can now be found in the care plan section)  Progress towards OT goals: Goals met/education completed, patient discharged from OT  Acute Rehab OT Goals Patient Stated Goal: play with granddaughter OT Goal Formulation: All assessment and education complete, DC therapy  Plan All goals met and education completed, patient discharged from [facility] OT "6 Clicks" Daily Activity     Outcome Measure   Help from another person eating meals?: None Help from another person taking care of personal grooming?: None Help from another person toileting, which includes using toliet, bedpan, or urinal?: None Help from another person bathing (including washing, rinsing, drying)?: None Help from another person to put on and taking off regular upper body clothing?: None Help from another person to put on and taking off regular lower body clothing?: None 6 Click Score: 24       Activity Tolerance Patient tolerated treatment well   Patient Left in bed;with call bell/phone within reach (no bed alarm, pt safe to be up by himself in room)           Time: [id number] OT Time Calculation (min): 14 min  Charges:  OT General Charges $OT Visit: 1 Visit OT Treatments $Self Care/Home Management : 8-22 mins  [place] Office [phone]    Almon Register 02/02/2023, 2:21 PM

## 2023-02-02 NOTE — Plan of Care (Signed)
Problem: Education: Goal: Ability to describe self-care measures that may prevent or decrease complications (Diabetes Survival Skills Education) will improve 02/02/2023 1947 by Daleen Bo, RN Outcome: Adequate for Discharge 02/02/2023 1336 by Daleen Bo, RN Outcome: Progressing Goal: Individualized Educational Video(s) 02/02/2023 1947 by Daleen Bo, RN Outcome: Adequate for Discharge 02/02/2023 1336 by Daleen Bo, RN Outcome: Progressing   Problem: Coping: Goal: Ability to adjust to condition or change in health will improve 02/02/2023 1947 by Daleen Bo, RN Outcome: Adequate for Discharge 02/02/2023 1336 by Daleen Bo, RN Outcome: Progressing   Problem: Fluid Volume: Goal: Ability to maintain a balanced intake and output will improve 02/02/2023 1947 by Daleen Bo, RN Outcome: Adequate for Discharge 02/02/2023 1336 by Daleen Bo, RN Outcome: Progressing   Problem: Health Behavior/Discharge Planning: Goal: Ability to identify and utilize available resources and services will improve 02/02/2023 1947 by Daleen Bo, RN Outcome: Adequate for Discharge 02/02/2023 1336 by Daleen Bo, RN Outcome: Progressing Goal: Ability to manage health-related needs will improve 02/02/2023 1947 by Daleen Bo, RN Outcome: Adequate for Discharge 02/02/2023 1336 by Daleen Bo, RN Outcome: Progressing   Problem: Metabolic: Goal: Ability to maintain appropriate glucose levels will improve 02/02/2023 1947 by Daleen Bo, RN Outcome: Adequate for Discharge 02/02/2023 1336 by Daleen Bo, RN Outcome: Progressing   Problem: Nutritional: Goal: Maintenance of adequate nutrition will improve 02/02/2023 1947 by Daleen Bo, RN Outcome: Adequate for Discharge 02/02/2023 1336 by Daleen Bo, RN Outcome: Progressing Goal: Progress toward achieving an optimal weight will improve 02/02/2023 1947 by Daleen Bo, RN Outcome: Adequate for  Discharge 02/02/2023 1336 by Daleen Bo, RN Outcome: Progressing   Problem: Skin Integrity: Goal: Risk for impaired skin integrity will decrease 02/02/2023 1947 by Daleen Bo, RN Outcome: Adequate for Discharge 02/02/2023 1336 by Daleen Bo, RN Outcome: Progressing   Problem: Tissue Perfusion: Goal: Adequacy of tissue perfusion will improve 02/02/2023 1947 by Daleen Bo, RN Outcome: Adequate for Discharge 02/02/2023 1336 by Daleen Bo, RN Outcome: Progressing   Problem: Education: Goal: Knowledge of General Education information will improve Description: Including pain rating scale, medication(s)/side effects and non-pharmacologic comfort measures 02/02/2023 1947 by Daleen Bo, RN Outcome: Adequate for Discharge 02/02/2023 1336 by Daleen Bo, RN Outcome: Progressing   Problem: Health Behavior/Discharge Planning: Goal: Ability to manage health-related needs will improve 02/02/2023 1947 by Daleen Bo, RN Outcome: Adequate for Discharge 02/02/2023 1336 by Daleen Bo, RN Outcome: Progressing   Problem: Clinical Measurements: Goal: Ability to maintain clinical measurements within normal limits will improve 02/02/2023 1947 by Daleen Bo, RN Outcome: Adequate for Discharge 02/02/2023 1336 by Daleen Bo, RN Outcome: Progressing Goal: Will remain free from infection 02/02/2023 1947 by Daleen Bo, RN Outcome: Adequate for Discharge 02/02/2023 1336 by Daleen Bo, RN Outcome: Progressing Goal: Diagnostic test results will improve 02/02/2023 1947 by Daleen Bo, RN Outcome: Adequate for Discharge 02/02/2023 1336 by Daleen Bo, RN Outcome: Progressing Goal: Respiratory complications will improve 02/02/2023 1947 by Daleen Bo, RN Outcome: Adequate for Discharge 02/02/2023 1336 by Daleen Bo, RN Outcome: Progressing Goal: Cardiovascular complication will be avoided 02/02/2023 1947 by Daleen Bo, RN Outcome:  Adequate for Discharge 02/02/2023 1336 by Daleen Bo, RN Outcome: Progressing   Problem: Activity: Goal: Risk for activity intolerance will decrease 02/02/2023 1947 by Daleen Bo, RN Outcome: Adequate for Discharge  02/02/2023 1336 by Daleen Bo, RN Outcome: Progressing   Problem: Nutrition: Goal: Adequate nutrition will be maintained 02/02/2023 1947 by Daleen Bo, RN Outcome: Adequate for Discharge 02/02/2023 1336 by Daleen Bo, RN Outcome: Progressing   Problem: Coping: Goal: Level of anxiety will decrease 02/02/2023 1947 by Daleen Bo, RN Outcome: Adequate for Discharge 02/02/2023 1336 by Daleen Bo, RN Outcome: Progressing   Problem: Elimination: Goal: Will not experience complications related to bowel motility 02/02/2023 1947 by Daleen Bo, RN Outcome: Adequate for Discharge 02/02/2023 1336 by Daleen Bo, RN Outcome: Progressing Goal: Will not experience complications related to urinary retention 02/02/2023 1947 by Daleen Bo, RN Outcome: Adequate for Discharge 02/02/2023 1336 by Daleen Bo, RN Outcome: Progressing   Problem: Pain Managment: Goal: General experience of comfort will improve 02/02/2023 1947 by Daleen Bo, RN Outcome: Adequate for Discharge 02/02/2023 1336 by Daleen Bo, RN Outcome: Progressing   Problem: Safety: Goal: Ability to remain free from injury will improve 02/02/2023 1947 by Daleen Bo, RN Outcome: Adequate for Discharge 02/02/2023 1336 by Daleen Bo, RN Outcome: Progressing   Problem: Skin Integrity: Goal: Risk for impaired skin integrity will decrease 02/02/2023 1947 by Daleen Bo, RN Outcome: Adequate for Discharge 02/02/2023 1336 by Daleen Bo, RN Outcome: Progressing

## 2023-02-02 NOTE — Care Management Important Message (Signed)
Important Message  Patient Details  Name: [name] [name] MRN: [id number] Date of Birth: Jan 28, 1956   Medicare Important Message Given:  Yes     Orbie Pyo 02/02/2023, 2:30 PM

## 2023-02-02 NOTE — Discharge Summary (Signed)
[name] [name] [id number] DOB: 05/24/1956 DOA: 01/27/2023  PCP: [name], MD  Admit date: 01/27/2023  Discharge date: 02/02/2023  Admitted From: Home   Disposition:  Home   Recommendations for Outpatient Follow-up:   Follow up with PCP in 1-2 weeks  PCP Please obtain BMP/CBC, 2 view CXR in 1week,  (see Discharge instructions)   PCP Please follow up on the following pending results: Outpatient neurology follow-up, follow-up on final CSF flow cytometry and culture results.   Home Health: PT   Equipment/Devices: None  Consultations: NEURO Discharge Condition: Stable    CODE STATUS: Full    Diet Recommendation: Heart Healthy Low Carb    Chief Complaint  Patient presents with   Code Stroke     Brief history of present illness from the day of admission and additional interim summary    67 y.o.  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  He was initially seen at [facility] emergency room-and transferred to [facility] for neurology evaluation.   Significant events: 3/20>> admit to [facility]   Significant studies: 3/20>> MRI brain: Small foci of restricted diffusion at the cortical frontoparietal vertex on both sides. 3/20>> CT angio head/neck: No LVO.  25% stenosis of both ICA bulb regions, 50% stenosis of right vertebral artery, 30% stenosis of left vertebral artery.   Significant microbiology data: None   Procedures: None   Consults: Neurology                                                                   Hospital Course   Intractable headache with metabolic encephalopathy Headache has resolved-after Decadron 10 mg IV on 3/21, Workup essentially normal except nonspecific MRI  findings which could be suggestive of RCVS, LP was done CSF unremarkable however CSF flow cytometry still pending.  He is currently off of antibiotics, thankfully currently symptom-free without any headache or photophobia.  Was seen by neurology team including the stroke team.  Thought to have headaches due to possible reversible cerebral vasoconstriction syndrome, at this time he will be discharged on his home medication Eliquis, blood pressure medic patients with close outpatient follow-up with PCP and neurologist.  Repeat echocardiogram stable, stable lower extremity venous duplex ruling out DVTs, small PFO on TCD doppler per Neuro Dr Leonie Man of no significance (plus on Eliquis).  Fully overall his symptoms are improving, repeat MRI from 01/31/2023 noted discussed with neurology.    Post discharge follow-up with PCP and neurology, CSF flow cytometry still pending.  Final CSF culture is also pending.   History of migraine headache/complex migraine Headache resolved after a dose of 10 mg Decadron yesterday.   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   CAD No anginal symptoms Continue Crestor/Coreg/Imdur   Chronic HFpEF Euvolemic Continue Farxiga   HLD Statin/Zetia   History of NASH liver cirrhosis Relatively compensated NH4 normal at time of admission   Thrombocytopenia Chronic issue Follows with hematology/oncology-unclear whether this is from cirrhosis (per hematology note).  He does have splenomegaly per CT scan February 2023.   Anxiety Celexa   DM-2 (A1c 7.8 09/29/2021) Continue home regimen   Discharge diagnosis     Principal Problem:   Acute metabolic encephalopathy Active Problems:   Uncontrolled type 2 diabetes mellitus with hyperglycemia,  without long-term current use of insulin (HCC)   Thrombocytopenia (HCC)   Nonintractable headache   Chronic heart failure with preserved ejection fraction (HFpEF) [facility])    Discharge instructions    Discharge Instructions     Discharge instructions   Complete by: As directed    Follow with Primary MD [name], MD in 7 days, follow-up on CSF flow cytometry results along with final CSF culture results.  Get CBC, CMP, magnesium-  checked next visit with your primary MD   Activity: As tolerated with Full fall precautions use walker/cane & assistance as needed  Disposition Home   Diet: Heart Healthy Low Carb, check CBGs q. ACH S.  Special Instructions: If you have smoked or chewed Tobacco  in the last 2 yrs please stop smoking, stop any regular Alcohol  and or any Recreational drug use.  On your next visit with your primary care physician please Get Medicines reviewed and adjusted.  Please request your Prim.MD to go over all Hospital Tests and Procedure/Radiological results at the follow up, please get all Hospital records sent to your Prim MD by signing hospital release before you go home.  If you experience worsening of your admission symptoms, develop shortness of breath, life threatening emergency, suicidal or homicidal thoughts you must seek medical attention immediately by calling 911 or calling your MD immediately  if symptoms less severe.  You Must read complete instructions/literature along with all the possible adverse reactions/side effects for all the Medicines you take and that have been prescribed to you. Take any new Medicines after you have completely understood and accpet all the possible adverse reactions/side effects.       Discharge Medications   Allergies as of 02/02/2023       Reactions   Divalproex Sodium Other (See Comments)   Causes anger   Statins Other (See Comments)   Muscle aches and cramps   Tramadol Other (See Comments)   Chest pain     Valproic Acid Other (See Comments)   Causes anger   Gadolinium Derivatives Nausea And Vomiting   07/25/19 Pt vomited immediately after IV gad. Denies itching, dyspnea.  (Adverse, not allergic, reaction   Tricor [fenofibrate] Other (See Comments)   Leg cramps        Medication List     STOP taking these medications    topiramate 50 MG tablet Commonly known as: TOPAMAX       TAKE these medications    acetaminophen 500 MG tablet Commonly known as: TYLENOL Take 1 tablet (500 mg total) by mouth every 8 (eight) hours as needed for moderate pain or headache.   albuterol 108 (90 Base) MCG/ACT inhaler Commonly known as: VENTOLIN HFA Inhale 2 puffs into the  lungs every 6 (six) hours as needed for wheezing or shortness of breath.   apixaban 5 MG Tabs tablet Commonly known as: ELIQUIS 2 po bid thru 2/25, then 1 po BID afterwards   carvedilol 3.125 MG tablet Commonly known as: COREG Take 1 tablet (3.125 mg total) by mouth 2 (two) times daily.   citalopram 20 MG tablet Commonly known as: CELEXA Take 1 tablet (20 mg total) by mouth at bedtime.   cyclobenzaprine 10 MG tablet Commonly known as: FLEXERIL Take 1 tablet (10 mg total) by mouth 3 (three) times daily as needed for muscle spasms.   dapagliflozin propanediol 5 MG Tabs tablet Commonly known as: Farxiga Take 1 tablet (5 mg total) by mouth daily before breakfast.   ezetimibe 10 MG tablet Commonly known as: ZETIA Take 1 tablet (10 mg total) by mouth daily.   fluticasone 50 MCG/ACT nasal spray Commonly known as: FLONASE Place 2 sprays into both nostrils 2 (two) times daily.   Fluticasone-Salmeterol 250-50 MCG/DOSE Aepb Commonly known as: ADVAIR Inhale 1 puff into the lungs every 12 (twelve) hours.   FreeStyle Libre 2 Reader Devi 1 Device by Does not apply route continuous.   FreeStyle Libre 2 Sensor Misc Apply one sensor every 14 days   gabapentin 300 MG capsule Commonly known as: NEURONTIN Take 1 capsule (300  mg total) by mouth 3 (three) times daily. Take 300 mg by mouth 3 (three) times daily.   isosorbide mononitrate 60 MG 24 hr tablet Commonly known as: IMDUR Take one (1) tablet by mouth ( 60 mg ) in the am and one half tablet ( 30 mg) in the pm.   Melatonin 10 MG Tabs Take 10 mg by mouth at bedtime.   metFORMIN 1000 MG tablet Commonly known as: GLUCOPHAGE Take 1 tablet (1,000 mg total) by mouth 2 (two) times daily.   nitroGLYCERIN 0.4 MG SL tablet Commonly known as: NITROSTAT Place 1 tablet (0.4 mg total) under the tongue every 5 (five) minutes x 3 doses as needed for chest pain (if no relief after 2nd dose, proceed to the ED for an evaluation or call 911). Place 0.4 mg under the tongue every 5 (five) minutes x 3 doses as needed for chest pain (if no relief after 2nd dose, proceed to the ED for an evaluation or call 911).   omeprazole 40 MG capsule Commonly known as: PRILOSEC Take 1 capsule (40 mg total) by mouth daily.   polycarbophil 625 MG tablet Commonly known as: FIBERCON Take 625 mg by mouth daily.   rosuvastatin 20 MG tablet Commonly known as: Crestor Take 1 tablet (20 mg total) by mouth daily.   tirzepatide 5 MG/0.5ML Pen Commonly known as: [name] Inject 5 mg into the skin once a week.   zonisamide 100 MG capsule Commonly known as: ZONEGRAN Take 100 mg by mouth daily.         Follow-up Information     [name], MD. Schedule an appointment as soon as possible for a visit in 1 week(s).   Specialty: Internal Medicine Contact information: [address] [place] [address] [phone]         [facility]. Schedule an appointment as soon as possible for a visit in 1 week(s).   Contact information: [address]     [place] [phone]                Major procedures and Radiology Reports - PLEASE  review detailed and final reports thoroughly  -        VAS [place]  TRANSCRANIAL DOPPLER W BUBBLES  Result Date: 02/02/2023  Transcranial Doppler with Bubble Patient Name:  FLETCHER HILLMER  Date of Exam:   02/02/2023 Medical Rec #: [id number]        Accession #:    [id number] Date of Birth: Jun 13, 1956        Patient Gender: M Patient Age:   18 years Exam Location:  [facility] Procedure:      VAS [place] TRANSCRANIAL DOPPLER W BUBBLES Referring Phys: Deno Etienne [facility] --------------------------------------------------------------------------------  Indications: Stroke. Performing Technologist: Darlin Coco RDMS, RVT  Examination Guidelines: A complete evaluation includes B-mode imaging, spectral Doppler, color Doppler, and power Doppler as needed of all accessible portions of each vessel. Bilateral testing is considered an integral part of a complete examination. Limited examinations for reoccurring indications may be performed as noted.  Summary: No HITS at rest or during Valsalva. Negative transcranial Doppler Bubble study with no evidence of right to left intracardiac communication.  A vascular evaluation was performed. The left middle cerebral artery was studied. An IV was inserted into the patient's left forearm. Verbal informed consent was obtained.  Less than 10 high intensity transient signals observed with valsalva, indicating a Spendcer Grade 1 patent foramen ovale with valsalva. No HITS observed at rest. *See table(s) above for TCD measurements and observations.    Preliminary    VAS [place] LOWER EXTREMITY VENOUS (DVT)  Result Date: 02/02/2023  Lower Venous DVT Study Patient Name:  LENOARD BATON  Date of Exam:   02/02/2023 Medical Rec #: [id number]        Accession #:    [id number] Date of Birth: [birth year]        Patient Gender: M Patient Age:   72 years Exam Location:  [facility] Procedure:      VAS [place] LOWER EXTREMITY VENOUS (DVT) Referring Phys: Deno Etienne [facility] --------------------------------------------------------------------------------  Indications: Stroke.  Other Indications: Recent history of PE/DVT. Comparison Study: 12-27-2022 Prior bilateral lower extremity venous study was                   positive for LEFT profunda vein DVT. Performing Technologist: Darlin Coco RDMS, RVT  Examination Guidelines: A complete evaluation includes B-mode imaging, spectral Doppler, color Doppler, and power Doppler as needed of all accessible portions of each vessel. Bilateral testing is considered an integral part of a complete examination. Limited examinations for reoccurring indications may be performed as noted. The reflux portion of the exam is performed with the patient in reverse Trendelenburg.  +---------+---------------+---------+-----------+----------+--------------+ RIGHT    CompressibilityPhasicitySpontaneityPropertiesThrombus Aging +---------+---------------+---------+-----------+----------+--------------+ CFV      Full           Yes      Yes                                 +---------+---------------+---------+-----------+----------+--------------+ SFJ      Full                                                        +---------+---------------+---------+-----------+----------+--------------+ FV Prox  Full                                                        +---------+---------------+---------+-----------+----------+--------------+  FV Mid   Full                                                        +---------+---------------+---------+-----------+----------+--------------+ FV DistalFull                                                        +---------+---------------+---------+-----------+----------+--------------+ PFV      Full                                                        +---------+---------------+---------+-----------+----------+--------------+ POP      Full           Yes      Yes                                 +---------+---------------+---------+-----------+----------+--------------+ PTV      Full                                                         +---------+---------------+---------+-----------+----------+--------------+ PERO     Full                                                        +---------+---------------+---------+-----------+----------+--------------+ Gastroc  Full                                                        +---------+---------------+---------+-----------+----------+--------------+   +---------+---------------+---------+-----------+----------+--------------+ LEFT     CompressibilityPhasicitySpontaneityPropertiesThrombus Aging +---------+---------------+---------+-----------+----------+--------------+ CFV      Full           Yes      Yes                                 +---------+---------------+---------+-----------+----------+--------------+ SFJ      Full                                                        +---------+---------------+---------+-----------+----------+--------------+ FV Prox  Full                                                        +---------+---------------+---------+-----------+----------+--------------+  FV Mid   Full                                                        +---------+---------------+---------+-----------+----------+--------------+ FV DistalFull                                                        +---------+---------------+---------+-----------+----------+--------------+ PFV      Full                                                        +---------+---------------+---------+-----------+----------+--------------+ POP      Full           Yes      Yes                                 +---------+---------------+---------+-----------+----------+--------------+ PTV      Full                                                        +---------+---------------+---------+-----------+----------+--------------+ PERO     Full                                                         +---------+---------------+---------+-----------+----------+--------------+ Gastroc  Full                                                        +---------+---------------+---------+-----------+----------+--------------+    Summary: RIGHT: - There is no evidence of deep vein thrombosis in the lower extremity.  - No cystic structure found in the popliteal fossa.  LEFT: - Findings appear improved from previous examination.  - There is no evidence of deep vein thrombosis in the lower extremity.  - No cystic structure found in the popliteal fossa.  *See table(s) above for measurements and observations.    Preliminary    ECHOCARDIOGRAM COMPLETE  Result Date: 02/01/2023    ECHOCARDIOGRAM REPORT   Patient Name:   CARMEN HOWZE Date of Exam: 02/01/2023 Medical Rec #:  [id number]       Height:       69.0 in Accession #:    [id number]      Weight:       202.0 lb Date of Birth:  02-05-1956       BSA:          2.075 m Patient Age:    11 years  BP:           145/66 mmHg Patient Gender: M               HR:           60 bpm. Exam Location:  Inpatient Procedure: 2D Echo, Color Doppler and Cardiac Doppler Indications:    Stroke  History:        Patient has prior history of Echocardiogram examinations, most                 recent 12/28/2022. CAD, Stroke; Risk Factors:Diabetes,                 Hypertension and Sleep Apnea.  Sonographer:    Marella Chimes Referring Phys: Arnell Asal Margaree Mackintosh [place]  1. Technically difficult study with poor visualization of cardiac structures.  2. Left ventricular ejection fraction, by estimation, is 55 to 60%. The left ventricle has normal function. The left ventricle has no regional wall motion abnormalities. Left ventricular diastolic parameters were normal.  3. Right ventricular systolic function is mildly reduced. The right ventricular size is mildly enlarged.  4. The mitral valve is normal in structure. No evidence of mitral valve regurgitation. No evidence of  mitral stenosis.  5. The aortic valve is normal in structure. There is mild calcification of the aortic valve. Aortic valve regurgitation is not visualized. No aortic stenosis is present.  6. The inferior vena cava is normal in size with greater than 50% respiratory variability, suggesting right atrial pressure of 3 mmHg. FINDINGS  Left Ventricle: Left ventricular ejection fraction, by estimation, is 55 to 60%. The left ventricle has normal function. The left ventricle has no regional wall motion abnormalities. The left ventricular internal cavity size was normal in size. There is  no left ventricular hypertrophy. Left ventricular diastolic parameters were normal. Right Ventricle: The right ventricular size is mildly enlarged. No increase in right ventricular wall thickness. Right ventricular systolic function is mildly reduced. Left Atrium: Left atrial size was normal in size. Right Atrium: Right atrial size was normal in size. Pericardium: There is no evidence of pericardial effusion. Mitral Valve: The mitral valve is normal in structure. No evidence of mitral valve regurgitation. No evidence of mitral valve stenosis. Tricuspid Valve: The tricuspid valve is normal in structure. Tricuspid valve regurgitation is not demonstrated. No evidence of tricuspid stenosis. Aortic Valve: The aortic valve is normal in structure. There is mild calcification of the aortic valve. There is mild to moderate aortic valve annular calcification. Aortic valve regurgitation is not visualized. No aortic stenosis is present. Aortic valve mean gradient measures 3.0 mmHg. Aortic valve peak gradient measures 7.6 mmHg. Aortic valve area, by VTI measures 2.28 cm. Pulmonic Valve: The pulmonic valve was normal in structure. Pulmonic valve regurgitation is not visualized. No evidence of pulmonic stenosis. Aorta: The aortic root is normal in size and structure. Venous: The inferior vena cava is normal in size with greater than 50% respiratory  variability, suggesting right atrial pressure of 3 mmHg. IAS/Shunts: No atrial level shunt detected by color flow Doppler.  LEFT VENTRICLE PLAX 2D LVIDd:         4.90 cm   Diastology LVIDs:         3.10 cm   LV e' medial:    4.68 cm/s LV PW:         0.90 cm   LV E/e' medial:  20.6 LV IVS:        0.90 cm  LV e' lateral:   11.00 cm/s LVOT diam:     1.90 cm   LV E/e' lateral: 8.8 LV SV:         62 LV SV Index:   30 LVOT Area:     2.84 cm  RIGHT VENTRICLE RV S prime:     8.81 cm/s TAPSE (M-mode): 1.2 cm LEFT ATRIUM           Index LA Vol (A4C): 27.2 ml 13.11 ml/m  AORTIC VALVE AV Area (Vmax):    1.80 cm AV Area (Vmean):   2.00 cm AV Area (VTI):     2.28 cm AV Vmax:           138.00 cm/s AV Vmean:          84.800 cm/s AV VTI:            0.274 m AV Peak Grad:      7.6 mmHg AV Mean Grad:      3.0 mmHg LVOT Vmax:         87.70 cm/s LVOT Vmean:        59.900 cm/s LVOT VTI:          0.220 m LVOT/AV VTI ratio: 0.80  AORTA Ao Root diam: 3.20 cm Ao Asc diam:  3.00 cm MITRAL VALVE               TRICUSPID VALVE MV Area (PHT): 2.90 cm    TR Peak grad:   17.0 mmHg MV Decel Time: 262 msec    TR Vmax:        206.00 cm/s MV E velocity: 96.40 cm/s MV A velocity: 96.40 cm/s  SHUNTS MV E/A ratio:  1.00        Systemic VTI:  0.22 m                            Systemic Diam: 1.90 cm Aditya Sabharwal Electronically signed by Hebert Soho Signature Date/Time: 02/01/2023/4:17:31 PM    Final    EEG adult  Result Date: 02/01/2023 Lora Havens, MD     02/01/2023 12:49 PM Patient Name: FABIANO THIRY MRN: [id number] Epilepsy Attending: Lora Havens Referring Physician/Provider: Otelia Santee, NP Date: 02/01/2023 Duration:  22.14 mins Patient history: 67 year old man with past medical history of migraines, prior stroke with no residual deficits, anxiety with acute onset of confusion, not feeling right and severe unremitting headache. EEG to evaluate for seizure Level of alertness: Awake AEDs during EEG study: ZNS Technical  aspects: This EEG study was done with scalp electrodes positioned according to the 10-20 International system of electrode placement. Electrical activity was reviewed with band pass filter of 1-70Hz , sensitivity of 7 uV/mm, display speed of 40mm/sec with a 60Hz  notched filter applied as appropriate. EEG data were recorded continuously and digitally stored.  Video monitoring was available and reviewed as appropriate. Description: The posterior dominant rhythm consists of 8 Hz activity of moderate voltage (25-35 uV) seen predominantly in posterior head regions, symmetric and reactive to eye opening and eye closing. Hyperventilation and photic stimulation were not performed.   IMPRESSION: This study is within normal limits. No seizures or epileptiform discharges were seen throughout the recording. A normal interictal EEG does not exclude nor support the diagnosis of epilepsy. Lora Havens   MR BRAIN W WO CONTRAST  Result Date: 01/31/2023 CLINICAL DATA:  67 year old male with unrelenting headache, now improved. Code stroke presentation on 01/27/2023.  Subtle but scattered and bilateral cortical restricted diffusion, signal abnormality on brain MRI at that time. Patient is status post fluoroscopic guided lumbar puncture yesterday with CSF negative for infection, protein mildly elevated. Query complex migraine. History also of cirrhosis, "Arthritis", diabetes. EXAM: MRI HEAD WITHOUT AND WITH CONTRAST TECHNIQUE: Multiplanar, multiecho pulse sequences of the brain and surrounding structures were obtained without and with intravenous contrast. CONTRAST:  54mL GADAVIST GADOBUTROL 1 MMOL/ML IV SOLN COMPARISON:  Noncontrast MRI 01/27/2023. MRV 01/28/2023. CTA head and neck 01/27/2023. FINDINGS: Brain: Ongoing abnormal bilateral vertex (superior frontal and superior parietal lobe) asymmetric scattered foci of cortical abnormal diffusion (series 5, image 94) with virtually the exact same areas affected as 4 days ago. No  definite brand new areas of involvement are identified. There are several more inferiorly located lesions, including the lateral right central sulcus series 5, image 90, inferior left parietal lobe image 87, right inferior parietal lobe image 85. Furthermore, there might also be left-side unilateral posterior temporal and/or lateral occipital lobe cortex involvement such as on series 5, images 76 and 74. Interestingly, and as before most of these areas are not apparent on T2/FLAIR, although there is subtle correlation today at the vertex on series 11, image 22. Following contrast, there is no cortical enhancement. And also no convincing leptomeningeal or pachymeningeal thickening or enhancement. Also, no evidence of mineralization or hemosiderin on SWI. No abnormal enhancement identified elsewhere in the brain. And otherwise stable gray and white matter signal. Nonspecific mild for age small white matter T2 and FLAIR hyperintense foci. Tiny chronic cerebellar lacunar infarcts again suggested. No superimposed no midline shift, mass effect, evidence of mass lesion, ventriculomegaly, extra-axial collection or acute intracranial hemorrhage. Cervicomedullary junction and pituitary are within normal limits. Vascular: Major intracranial vascular flow voids are stable. Following contrast the major dural venous sinuses are enhancing and appear to be patent. Skull and upper cervical spine: Negative for age visible cervical spine and spinal cord. Visualized bone marrow signal is within normal limits. Sinuses/Orbits: Bulky but stable sphenoid paranasal sinus disease with only mild sinus mucosal thickening elsewhere. Chronic postoperative changes to both globes. Other: Mastoid air cells remain well aerated. Visible internal auditory structures appear normal. Visible scalp and face appear negative. IMPRESSION: 1. Unresolved unusual cortical diffusion abnormality, primarily bilaterally at the vertex but perhaps also subtle  involvement lower down such as left temporal lobe. As before there is no significant gyral edema, very little correlation on T2/FLAIR. And there is no enhancement following contrast today, no evidence of an overlying meningeal abnormality following contrast. No definite brand new areas of involvement. 2. Otherwise stable MRI appearance of the brain. 3. This was discussed by telephone with Dr. Amie Portland on 01/31/2023 at 12:38. 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 Also lack of enhancement would argue against infectious or inflammatory encephalitis. Large B-cell lymphoma remains possible, CSF cytology is pending. Hepatic encephalopathy according to the literature can rarely present with a cortical pattern of signal abnormality. Dr. Rory Percy advised this patient does Not have clinical symptoms of CJD. Electronically Signed   By: Genevie Ann M.D.   On: 01/31/2023 12:41   DG FL GUIDED LUMBAR PUNCTURE  Result Date: 01/30/2023 CLINICAL DATA:  67 year old male with complaint of AMS, encephalopathy. EXAM: LUMBAR PUNCTURE UNDER FLUOROSCOPY PROCEDURE: An appropriate skin entry site was determined fluoroscopically. Operator donned sterile gloves and mask. Skin site was marked, then prepped with Betadine, draped in  usual sterile fashion, and infiltrated locally with 1% lidocaine. A 20 gauge spinal needle advanced into the thecal sac at L3-L4 from a right interlaminar approach. Clear colorless CSF spontaneously returned, with opening pressure of 21 cm water. 13 ml CSF were collected and divided among 4 sterile vials for the requested laboratory studies. The needle was then removed and the skin cleansed and bandaged. The patient tolerated the procedure well. No immediate complication. FLUOROSCOPY: Radiation Exposure Index (as provided by the fluoroscopic device): 15.6 mGy Kerma IMPRESSION: Technically successful lumbar puncture under fluoroscopy.  This exam was performed by Brynda Greathouse PA-C, and was supervised and interpreted by Van Clines, MD. Electronically Signed   By: Van Clines M.D.   On: 01/30/2023 15:12   DG Chest Port 1 View  Result Date: 01/30/2023 CLINICAL DATA:  Shortness of breath. EXAM: PORTABLE CHEST 1 VIEW COMPARISON:  01/17/2023 FINDINGS: Low volume film. Asymmetric elevation right hemidiaphragm. The lungs are clear without focal pneumonia, edema, pneumothorax or pleural effusion. The cardiopericardial silhouette is within normal limits for size. The visualized bony structures of the thorax are unremarkable. IMPRESSION: Low volume film without acute cardiopulmonary findings. Electronically Signed   By: Misty Stanley M.D.   On: 01/30/2023 08:37   MR Venogram Head  Result Date: 01/28/2023 CLINICAL DATA:  History of migraines and prior stroke presents with acute onset confusion and headache. EXAM: MR VENOGRAM HEAD WITHOUT CONTRAST TECHNIQUE: Angiographic images of the intracranial venous structures were acquired using MRV technique without intravenous contrast. COMPARISON:  CTA head 1 day prior FINDINGS: There is no evidence of dural venous sinus or deep cerebral vein thrombosis. No dural venous sinus stenosis. IMPRESSION: Normal noncontrast MRV head with no evidence of venous sinus thrombosis. Electronically Signed   By: Valetta Mole M.D.   On: 01/28/2023 14:07   MR BRAIN WO CONTRAST  Result Date: 01/27/2023 CLINICAL DATA:  Neuro deficit, acute, stroke suspected. EXAM: MRI HEAD WITHOUT CONTRAST TECHNIQUE: Multiplanar, multiecho pulse sequences of the brain and surrounding structures were obtained without intravenous contrast. COMPARISON:  Head CT and CT angiography same day FINDINGS: Brain: Diffusion imaging shows small areas of restricted diffusion affecting cortical brain at the frontoparietal vertex on both sides, slightly more prominent on the right than the left. No large confluent infarction. The pattern does  not seem likely due to micro embolic infarctions from the heart or ascending aorta, though that is not completely excluded. Differential diagnosis would include atypical watershed insult, findings related to meningitis, autoimmune cerebritis, CJ viral disease, and in rare instances lymphoma of the brain. The brainstem is normal. Few old small vessel cerebellar infarctions. Cerebral hemispheres otherwise show minor small-vessel ischemic changes of the white matter. No ordinary mass, hemorrhage, hydrocephalus or extra-axial collection Vascular: Major vessels at the base of the brain show flow. Skull and upper cervical spine: Negative Sinuses/Orbits: Left sphenoid sinusitis with fluid opacification. Orbits negative. Other: None IMPRESSION: 1. Small foci of restricted diffusion at the cortical frontoparietal vertex on both sides, slightly more prominent on the right than the left. No large confluent infarction. The finding would be unusual for central origin micro embolic infarctions. See above differential discussion, which includes a wide range of other potential brain insults. 2. Mild chronic small-vessel ischemic changes elsewhere affecting the cerebellum and cerebral hemispheric white matter. 3. Left sphenoid sinusitis. Electronically Signed   By: Nelson Chimes M.D.   On: 01/27/2023 11:53   CT ANGIO HEAD NECK W WO CM  Result Date: 01/27/2023 CLINICAL DATA:  Neuro  deficit, acute, stroke suspected. Fell 10 days ago. EXAM: CT ANGIOGRAPHY HEAD AND NECK TECHNIQUE: Multidetector CT imaging of the head and neck was performed using the standard protocol during bolus administration of intravenous contrast. Multiplanar CT image reconstructions and MIPs were obtained to evaluate the vascular anatomy. Carotid stenosis measurements (when applicable) are obtained utilizing NASCET criteria, using the distal internal carotid diameter as the denominator. RADIATION DOSE REDUCTION: This exam was performed according to the  departmental dose-optimization program which includes automated exposure control, adjustment of the mA and/or kV according to patient size and/or use of iterative reconstruction technique. CONTRAST:  20mL OMNIPAQUE IOHEXOL 350 MG/ML SOLN COMPARISON:  Head CT earlier same day FINDINGS: CTA NECK FINDINGS Aortic arch: Aortic atherosclerosis. Right carotid system: Common carotid artery widely patent to the bifurcation with some calcified plaque. Carotid bifurcation shows extensive calcified plaque. ICA bulb shows soft and calcified plaque. Minimal diameter at the distal bulb measures 3 mm. Compared to a more distal cervical ICA diameter of 4 mm, this indicates a 25% stenosis. ICA widely patent beyond that. Left carotid system: Common carotid artery widely patent to the bifurcation. Soft and calcified plaque at the carotid bifurcation and ICA bulb with mild irregularity. Minimal diameter at the distal ICA bulb measures 3 mm. Compared to a more distal cervical ICA diameter of 4 mm, this indicates a 25% stenosis. ICA widely patent beyond that. Vertebral arteries: Calcified plaque at both vertebral artery origins. 50% stenosis of the proximal right vertebral artery. 30% stenosis of the proximal left vertebral artery. Beyond that, both vessels are patent through the cervical region to the foramen magnum. Skeleton: Chronic fusion from C2 through C4. Mild degenerative change below that. Other neck: No soft tissue mass or lymphadenopathy. Upper chest: Lung apices are clear. Review of the MIP images confirms the above findings CTA HEAD FINDINGS Anterior circulation: Both internal carotid arteries are patent through the skull base and siphon regions. Ordinary siphon atherosclerosis without stenosis greater than 30%. The anterior and middle cerebral vessels are patent. No large vessel occlusion or proximal stenosis. Posterior circulation: Both vertebral arteries are patent through the foramen magnum to the basilar artery. No  basilar stenosis. Posterior circulation branch vessels are normal. Venous sinuses: Patent and normal. Anatomic variants: None significant. Review of the MIP images confirms the above findings IMPRESSION: 1. No intracranial large vessel occlusion or correctable proximal stenosis. 2. Aortic atherosclerosis. 3. Atherosclerotic disease at both carotid bifurcations. 25% stenosis of both ICA bulb regions. 4. 50% stenosis of the proximal right vertebral artery. 30% stenosis of the proximal left vertebral artery. Beyond that, both vertebral arteries are patent through the cervical region to the basilar artery. Aortic Atherosclerosis (ICD10-I70.0). Electronically Signed   By: Nelson Chimes M.D.   On: 01/27/2023 11:39   CT HEAD CODE STROKE WO CONTRAST  Result Date: 01/27/2023 CLINICAL DATA:  Code stroke.  Neuro deficit, acute, stroke suspected EXAM: CT HEAD WITHOUT CONTRAST TECHNIQUE: Contiguous axial images were obtained from the base of the skull through the vertex without intravenous contrast. RADIATION DOSE REDUCTION: This exam was performed according to the departmental dose-optimization program which includes automated exposure control, adjustment of the mA and/or kV according to patient size and/or use of iterative reconstruction technique. COMPARISON:  CT head March 10, 24. FINDINGS: Brain: No evidence of acute infarction, hemorrhage, hydrocephalus, extra-axial collection or mass lesion/mass effect. Vascular: No hyperdense vessel. Skull: No acute fracture. Sinuses/Orbits: Partial opacification left sphenoid sinus and mild scattered mucosal thickening. No acute orbital findings. Other:  No mastoid effusions. ASPECTS [facility] Stroke Program Early CT Score) total score (0-10 with 10 being normal): 10. IMPRESSION: 1. No evidence of acute intracranial abnormality. 2. ASPECTS is 10. Code stroke imaging results were communicated on 01/27/2023 at 10:18 am to provider Tomi Bamberger via telephone, who verbally acknowledged these  results. Electronically Signed   By: Margaretha Sheffield M.D.   On: 01/27/2023 10:19   DG Elbow Complete Left  Result Date: 01/17/2023 CLINICAL DATA:  Fall and left elbow pain. EXAM: LEFT ELBOW - COMPLETE 3+ VIEW COMPARISON:  None Available. FINDINGS: There is no acute fracture or dislocation. The bones are well mineralized. No significant arthritic changes. No joint effusion. The soft tissues are unremarkable. IMPRESSION: Negative. Electronically Signed   By: Anner Crete M.D.   On: 01/17/2023 18:51   DG Chest Port 1 View  Result Date: 01/17/2023 CLINICAL DATA:  Pain. EXAM: PORTABLE CHEST 1 VIEW COMPARISON:  12/26/2022 FINDINGS: The lungs are clear without focal pneumonia, edema, pneumothorax or pleural effusion. The cardiopericardial silhouette is within normal limits for size. The visualized bony structures of the thorax are unremarkable. IMPRESSION: No active disease. Electronically Signed   By: Misty Stanley M.D.   On: 01/17/2023 18:06   DG Pelvis Portable  Result Date: 01/17/2023 CLINICAL DATA:  Pain. EXAM: PORTABLE PELVIS 1-2 VIEWS COMPARISON:  None Available. FINDINGS: There is no evidence of pelvic fracture or diastasis. No pelvic bone lesions are seen. IMPRESSION: Negative. Electronically Signed   By: Misty Stanley M.D.   On: 01/17/2023 18:05   DG Knee Complete 4 Views Left  Result Date: 01/17/2023 CLINICAL DATA:  Pain.  Fall. EXAM: LEFT KNEE - COMPLETE 4+ VIEW COMPARISON:  None Available. FINDINGS: No evidence of fracture, dislocation, or joint effusion. No evidence of arthropathy or other focal bone abnormality. Soft tissues are unremarkable. IMPRESSION: Negative. Electronically Signed   By: Misty Stanley M.D.   On: 01/17/2023 18:04   CT Head Wo Contrast  Result Date: 01/17/2023 CLINICAL DATA:  Dizziness.  Head trauma. EXAM: CT HEAD WITHOUT CONTRAST CT CERVICAL SPINE WITHOUT CONTRAST TECHNIQUE: Multidetector CT imaging of the head and cervical spine was performed following the  standard protocol without intravenous contrast. Multiplanar CT image reconstructions of the cervical spine were also generated. RADIATION DOSE REDUCTION: This exam was performed according to the departmental dose-optimization program which includes automated exposure control, adjustment of the mA and/or kV according to patient size and/or use of iterative reconstruction technique. COMPARISON:  Brain CT 12/27/2022 FINDINGS: CT HEAD FINDINGS Brain: No evidence for acute cortically based infarct, intracranial hemorrhage, mass lesion or mass effect. Punctate remote right cerebellar infarct. Basal ganglia calcifications. Vascular: No hyperdense vessel or unexpected calcification. Skull: Normal. Negative for fracture or focal lesion. Sinuses/Orbits: Fluid within the sphenoid sinus. Remainder of the paranasal sinuses unremarkable. Mastoid air cells unremarkable. Other: None CT CERVICAL SPINE FINDINGS Alignment: Straightening of the normal cervical lordosis. Skull base and vertebrae: No acute fracture. No primary bone lesion or focal pathologic process. Soft tissues and spinal canal: No prevertebral fluid or swelling. No visible canal hematoma. Disc levels: Osseous fusion C3-4 with fusion of the left C2-3 and C3-4 facets. Multilevel degenerative disc disease. Upper chest: Unremarkable. Other: Bilateral carotid arterial vascular calcifications. IMPRESSION: 1. No acute intracranial process. 2. No acute cervical spine fracture. Multilevel degenerative disc disease. Electronically Signed   By: Lovey Newcomer M.D.   On: 01/17/2023 17:44   CT Cervical Spine Wo Contrast  Result Date: 01/17/2023 CLINICAL DATA:  Dizziness.  Head  trauma. EXAM: CT HEAD WITHOUT CONTRAST CT CERVICAL SPINE WITHOUT CONTRAST TECHNIQUE: Multidetector CT imaging of the head and cervical spine was performed following the standard protocol without intravenous contrast. Multiplanar CT image reconstructions of the cervical spine were also generated. RADIATION  DOSE REDUCTION: This exam was performed according to the departmental dose-optimization program which includes automated exposure control, adjustment of the mA and/or kV according to patient size and/or use of iterative reconstruction technique. COMPARISON:  Brain CT 12/27/2022 FINDINGS: CT HEAD FINDINGS Brain: No evidence for acute cortically based infarct, intracranial hemorrhage, mass lesion or mass effect. Punctate remote right cerebellar infarct. Basal ganglia calcifications. Vascular: No hyperdense vessel or unexpected calcification. Skull: Normal. Negative for fracture or focal lesion. Sinuses/Orbits: Fluid within the sphenoid sinus. Remainder of the paranasal sinuses unremarkable. Mastoid air cells unremarkable. Other: None CT CERVICAL SPINE FINDINGS Alignment: Straightening of the normal cervical lordosis. Skull base and vertebrae: No acute fracture. No primary bone lesion or focal pathologic process. Soft tissues and spinal canal: No prevertebral fluid or swelling. No visible canal hematoma. Disc levels: Osseous fusion C3-4 with fusion of the left C2-3 and C3-4 facets. Multilevel degenerative disc disease. Upper chest: Unremarkable. Other: Bilateral carotid arterial vascular calcifications. IMPRESSION: 1. No acute intracranial process. 2. No acute cervical spine fracture. Multilevel degenerative disc disease. Electronically Signed   By: Lovey Newcomer M.D.   On: 01/17/2023 17:44    Micro Results    Recent Results (from the past 240 hour(s))  Urine Culture     Status: None   Collection Time: 01/27/23  1:16 PM   Specimen: Urine, Clean Catch  Result Value Ref Range Status   Specimen Description   Final    URINE, CLEAN CATCH Performed at [facility], [address] [place], [place] [address]    Special Requests   Final    NONE Performed at [facility], [address] [place], [place] [address]    Culture   Final    NO GROWTH Performed at [facility], [place] [address] [place],  [place] [address]    Report Status 01/29/2023 FINAL  Final  CSF culture w Gram Stain     Status: None (Preliminary result)   Collection Time: 01/30/23  3:08 PM   Specimen: CSF; Cerebrospinal Fluid  Result Value Ref Range Status   Specimen Description CSF  Final   Special Requests NONE  Final   Gram Stain CYTOSPIN SMEAR NO WBC SEEN NO ORGANISMS SEEN   Final   Culture   Final    NO GROWTH 3 DAYS Performed at [facility], [place] [address], [place], [place] [address]    Report Status PENDING  Incomplete  Culture, blood (Routine X 2) w Reflex to ID Panel     Status: None (Preliminary result)   Collection Time: 01/31/23  2:40 PM   Specimen: BLOOD RIGHT HAND  Result Value Ref Range Status   Specimen Description BLOOD RIGHT HAND  Final   Special Requests   Final    BOTTLES DRAWN AEROBIC AND ANAEROBIC Blood Culture results may not be optimal due to an excessive volume of blood received in culture bottles   Culture   Final    NO GROWTH 2 DAYS Performed at [facility], [place] [address], [place], [place] [address]    Report Status PENDING  Incomplete  Culture, blood (Routine X 2) w Reflex to ID Panel     Status: None (Preliminary result)   Collection Time: 01/31/23  2:40 PM   Specimen: BLOOD LEFT HAND  Result Value Ref Range Status   Specimen Description BLOOD LEFT HAND  Final   Special Requests   Final    BOTTLES DRAWN AEROBIC ONLY Blood Culture adequate volume   Culture   Final    NO GROWTH 2 DAYS Performed at [facility], [address] [place], [place] [address]    Report Status PENDING  Incomplete    Today   Subjective    Opie Opalinski today has no headache,no chest abdominal pain,no new weakness tingling or numbness, feels much better wants to go home today.    Objective   Blood pressure 117/66, pulse 63, temperature 98 F (36.7 C), temperature source Oral, resp. rate 15, height 5\' 9"  (1.753 m), weight 91.6 kg, SpO2 99 %.   Intake/Output Summary (Last 24  hours) at 02/02/2023 1558 Last data filed at 02/02/2023 1300 Gross per 24 hour  Intake 598 ml  Output --  Net 598 ml    Exam  Awake Alert, No new F.N deficits,    [place].AT,PERRAL Supple Neck,   Symmetrical Chest wall movement, Good air movement bilaterally, CTAB RRR,No Gallops,   +ve B.Sounds, Abd Soft, Non tender,  No Cyanosis, Clubbing or edema    Data Review   Recent Labs  Lab 01/27/23 1008 01/27/23 1018 01/29/23 0602 01/30/23 0622 01/31/23 0326 02/01/23 0653 02/02/23 0555  WBC 5.1   < > 5.2 2.9* 2.7* 3.0* 3.1*  HGB 16.3   < > 13.7 12.9* 12.7* 13.4 13.6  HCT 45.5   < > 38.4* 36.9* 36.8* 37.9* 37.3*  PLT 85*   < > 61* 54* 58* 60* 62*  MCV 87.5   < > 87.7 89.6 89.5 88.6 87.4  MCH 31.3   < > 31.3 31.3 30.9 31.3 31.9  MCHC 35.8   < > 35.7 35.0 34.5 35.4 36.5*  RDW 14.7   < > 14.1 14.6 14.6 14.5 14.6  LYMPHSABS 0.9  --   --   --   --   --   --   MONOABS 0.4  --   --   --   --   --   --   EOSABS 0.1  --   --   --   --   --   --   BASOSABS 0.0  --   --   --   --   --   --    < > = values in this interval not displayed.    Recent Labs  Lab 01/27/23 1008 01/27/23 1018 01/27/23 1052 01/28/23 0427 01/29/23 0602 01/30/23 0622 01/31/23 0326 01/31/23 0953 02/01/23 0653 02/02/23 0555  NA 137   < >  --  137 139  --   --  139 140 135  K 3.7   < >  --  3.2* 3.4*  --   --  4.2 3.5 3.6  CL 106   < >  --  109 110  --   --  112* 111 108  CO2 20*  --   --  18* 17*  --   --  20* 22 18*  ANIONGAP 11  --   --  10 12  --   --  7 7 9   GLUCOSE 155*   < >  --  103* 160*  --   --  110* 91 106*  BUN 15   < >  --  9 9  --   --  10 9 11   CREATININE 0.97   < >  --  0.87 0.95  --   --  0.91 0.86 0.77  AST 33  --   --   --   --   --   --   --   --   --   ALT 26  --   --   --   --   --   --   --   --   --   ALKPHOS 52  --   --   --   --   --   --   --   --   --   BILITOT 1.3*  --   --   --   --   --   --   --   --   --   ALBUMIN 3.7  --   --   --   --   --   --   --   --   --   CRP  --    --   --   --   --  <0.5 0.5  --  0.5 <0.5  PROCALCITON  --   --   --   --   --  <0.10 <0.10  --  <0.10 <0.10  INR 1.6*  --   --   --   --   --   --   --   --   --   TSH 1.077  --   --   --   --   --   --   --   --   --   HGBA1C 5.9*  --   --   --   --   --   --   --   --   --   AMMONIA  --   --  18  --   --   --   --   --   --   --   BNP  --   --   --   --   --  119.2* 117.3*  --  95.5 49.0  MG  --   --   --   --   --  1.6* 2.2  --  2.0 1.9  CALCIUM 9.0  --   --  8.3* 8.6*  --   --  8.3* 8.5* 8.7*   < > = values in this interval not displayed.    Lab Results  Component Value Date   CHOL 87 02/02/2023   HDL 32 (L) 02/02/2023   LDLCALC 36 02/02/2023   TRIG 96 02/02/2023   CHOLHDL 2.7 02/02/2023    Total Time in preparing paper work, data evaluation and todays exam - 35 minutes  Signature  -    Lala Lund M.D on 02/02/2023 at 3:58 PM   -  To page go to [email or web address]

## 2023-02-02 NOTE — Progress Notes (Signed)
PROGRESS NOTE        PATIENT DETAILS Name: [name] [name] Age: 67 y.o. Sex: male Date of Birth: [birth year] Admit Date: 01/27/2023 Admitting Physician Orson Eva, MD [id number], Hazle Nordmann, MD  Brief Summary: Patient is a 67 y.o.  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  He was initially seen at [facility] emergency room-and transferred to [facility] for neurology evaluation.  Significant events: 3/20>> admit to [facility]  Significant studies: 3/20>> MRI brain: Small foci of restricted diffusion at the cortical frontoparietal vertex on both sides. 3/20>> CT angio head/neck: No LVO.  25% stenosis of both ICA bulb regions, 50% stenosis of right vertebral artery, 30% stenosis of left vertebral artery.  Significant microbiology data: None  Procedures: None  Consults: Neurology   Subjective: Patient in bed, appears comfortable, denies any headache, no fever, no chest pain or pressure, no shortness of breath , no abdominal pain. No focal weakness.     Objective: Vitals: Blood pressure 117/66, pulse 63, temperature 98 F (36.7 C), temperature source Oral, resp. rate 15, height 5\' 9"  (1.753 m), weight 91.6 kg, SpO2 99 %.   Exam:  Awake Alert, O x 3, No new F.N deficits, Normal affect [place].AT,PERRAL Supple Neck, No JVD,   Symmetrical Chest wall movement, Good air movement bilaterally, CTAB RRR,No Gallops, Rubs or new Murmurs,  +ve B.Sounds, Abd Soft, No tenderness,   No Cyanosis, Clubbing or edema   Assessment/Plan:  Intractable headache with metabolic encephalopathy Headache has resolved-after Decadron 10 mg IV on 3/21 Encephalopathy overall improved-but had some hallucinations last night Suspicion for meningoencephalitis-but differentials include vasculitis/autoimmune cerebritis etc. given MRI brain findings. Remains on empiric  broad-spectrum antibiotics to cover meningitis After Plavix washout LP was done on 01/30/2023 CSF looks reassuring not consistent with active infection, case discussed with neurologist on 01/31/2023, discontinue all antibiotics on 01/31/2023.  Fully overall his symptoms are improving, repeat MRI from 01/31/2023 noted discussed with neurology.  Repeat echocardiogram remains stable, transcranial Dopplers with bubble along with lower extremity venous Dopplers pending, stable lipid panel and A1c   to complete full stroke workup and follow CSF flow cytometry.  If all tests good and he is feeling better likely discharge on 02/03/2023.  History of migraine headache/complex migraine Headache resolved after a dose of 10 mg Decadron yesterday.   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  CAD No anginal symptoms Continue Crestor/Coreg/Imdur  Chronic HFpEF Euvolemic Continue Farxiga  HLD Statin/Zetia  History of NASH liver cirrhosis Relatively compensated NH4 normal at time of admission  Thrombocytopenia Chronic issue Follows with hematology/oncology-unclear whether this is from cirrhosis (per hematology note).  He does have splenomegaly per CT scan February 2023.  Anxiety Celexa  DM-2 (A1c 7.8 09/29/2021) Monitor CBGs on SSI Continue Farxiga  Recent Labs    02/01/23 1146 02/01/23 1550 02/01/23 2134  GLUCAP 100* 140* 119*      BMI: Estimated body mass index is 29.83 kg/m as calculated from the following:   Height as of this encounter: 5\' 9"  (1.753 m).   Weight as of  this encounter: 91.6 kg.   Code status:   Code Status: Full Code   DVT Prophylaxis:IV heparinapixaban (ELIQUIS) tablet 5 mg     Family Communication: Spouse at bedside  01/30/23, 01/31/23   Disposition Plan: Status is: Inpatient Remains inpatient appropriate because: Severity of illness   Planned Discharge Destination:Home   Diet: Diet Order             Diet Carb Modified Fluid consistency: Thin; Room service appropriate? Yes  Diet effective now                   MEDICATIONS: Scheduled Meds:  apixaban  5 mg Oral BID   carvedilol  3.125 mg Oral BID WC   citalopram  20 mg Oral QHS   dapagliflozin propanediol  5 mg Oral QAC breakfast   ezetimibe  10 mg Oral Daily   insulin aspart  0-5 Units Subcutaneous QHS   insulin aspart  0-9 Units Subcutaneous TID WC   isosorbide mononitrate  30 mg Oral QHS   isosorbide mononitrate  60 mg Oral Daily   melatonin  9 mg Oral QHS   mometasone-formoterol  2 puff Inhalation BID   pantoprazole  40 mg Oral Daily   rosuvastatin  20 mg Oral Daily   zonisamide  100 mg Oral Daily   Continuous Infusions:   PRN Meds:.acetaminophen **OR** acetaminophen, albuterol, HYDROcodone-acetaminophen, labetalol, naLOXone (NARCAN)  injection, ondansetron **OR** ondansetron (ZOFRAN) IV, mouth rinse   I have personally reviewed following labs and imaging studies  LABORATORY DATA:  Recent Labs  Lab 01/27/23 1008 01/27/23 1018 01/29/23 0602 01/30/23 0622 01/31/23 0326 02/01/23 0653 02/02/23 0555  WBC 5.1   < > 5.2 2.9* 2.7* 3.0* 3.1*  HGB 16.3   < > 13.7 12.9* 12.7* 13.4 13.6  HCT 45.5   < > 38.4* 36.9* 36.8* 37.9* 37.3*  PLT 85*   < > 61* 54* 58* 60* 62*  MCV 87.5   < > 87.7 89.6 89.5 88.6 87.4  MCH 31.3   < > 31.3 31.3 30.9 31.3 31.9  MCHC 35.8   < > 35.7 35.0 34.5 35.4 36.5*  RDW 14.7   < > 14.1 14.6 14.6 14.5 14.6  LYMPHSABS 0.9  --   --   --   --   --   --   MONOABS 0.4  --   --   --   --   --   --   EOSABS 0.1  --   --   --   --   --   --   BASOSABS 0.0  --   --   --   --   --   --    < > = values in this interval not displayed.    Recent Labs  Lab 01/27/23 1008 01/27/23 1018 01/27/23 1052  01/28/23 0427 01/29/23 0602 01/30/23 0622 01/31/23 0326 01/31/23 0953 02/01/23 0653 02/02/23 0555  NA 137   < >  --  137 139  --   --  139 140 135  K 3.7   < >  --  3.2* 3.4*  --   --  4.2 3.5 3.6  CL 106   < >  --  109 110  --   --  112* 111 108  CO2 20*  --   --  18* 17*  --   --  20* 22 18*  ANIONGAP 11  --   --  10 12  --   --  7 7 9   GLUCOSE 155*   < >  --  103* 160*  --   --  110* 91 106*  BUN 15   < >  --  9 9  --   --  10 9 11   CREATININE 0.97   < >  --  0.87 0.95  --   --  0.91 0.86 0.77  AST 33  --   --   --   --   --   --   --   --   --   ALT 26  --   --   --   --   --   --   --   --   --   ALKPHOS 52  --   --   --   --   --   --   --   --   --   BILITOT 1.3*  --   --   --   --   --   --   --   --   --   ALBUMIN 3.7  --   --   --   --   --   --   --   --   --   CRP  --   --   --   --   --  <0.5 0.5  --  0.5 <0.5  PROCALCITON  --   --   --   --   --  <0.10 <0.10  --  <0.10 <0.10  INR 1.6*  --   --   --   --   --   --   --   --   --   TSH 1.077  --   --   --   --   --   --   --   --   --   HGBA1C 5.9*  --   --   --   --   --   --   --   --   --   AMMONIA  --   --  18  --   --   --   --   --   --   --   BNP  --   --   --   --   --  119.2* 117.3*  --  95.5 49.0  MG  --   --   --   --   --  1.6* 2.2  --  2.0 1.9  CALCIUM 9.0  --   --  8.3* 8.6*  --   --  8.3* 8.5* 8.7*   < > = values in this interval not displayed.      Recent Labs  Lab 01/27/23 1008 01/27/23 1052 01/28/23 0427 01/29/23 0602 01/30/23 0622 01/31/23 0326 01/31/23 0953 02/01/23 0653 02/02/23 0555  CRP  --   --   --   --  <0.5 0.5  --  0.5 <0.5  PROCALCITON  --   --   --   --  <0.10 <0.10  --  <0.10 <0.10  INR 1.6*  --   --   --   --   --   --   --   --   TSH 1.077  --   --   --   --   --   --   --   --   HGBA1C 5.9*  --   --   --   --   --   --   --   --  AMMONIA  --  18  --   --   --   --   --   --   --   BNP  --   --   --   --  119.2* 117.3*  --  95.5 49.0  MG  --   --   --   --  1.6* 2.2  --   2.0 1.9  CALCIUM 9.0  --  8.3* 8.6*  --   --  8.3* 8.5* 8.7*    Lab Results  Component Value Date   CHOL 87 02/02/2023   HDL 32 (L) 02/02/2023   LDLCALC 36 02/02/2023   TRIG 96 02/02/2023   CHOLHDL 2.7 02/02/2023   Lab Results  Component Value Date   HGBA1C 5.9 (H) 01/27/2023    RADIOLOGY STUDIES/RESULTS: ECHOCARDIOGRAM COMPLETE  Result Date: 02/01/2023    ECHOCARDIOGRAM REPORT   Patient Name:   [name] [name] Date of Exam: 02/01/2023 Medical Rec #:  [id number]       Height:       69.0 in Accession #:    [id number]      Weight:       202.0 lb Date of Birth:  1956-03-03       BSA:          2.075 m Patient Age:    11 years        BP:           145/66 mmHg Patient Gender: M               HR:           60 bpm. Exam Location:  Inpatient Procedure: 2D Echo, Color Doppler and Cardiac Doppler Indications:    Stroke  History:        Patient has prior history of Echocardiogram examinations, most                 recent 12/28/2022. CAD, Stroke; Risk Factors:Diabetes,                 Hypertension and Sleep Apnea.  Sonographer:    Marella Chimes Referring Phys: Arnell Asal Margaree Mackintosh [place]  1. Technically difficult study with poor visualization of cardiac structures.  2. Left ventricular ejection fraction, by estimation, is 55 to 60%. The left ventricle has normal function. The left ventricle has no regional wall motion abnormalities. Left ventricular diastolic parameters were normal.  3. Right ventricular systolic function is mildly reduced. The right ventricular size is mildly enlarged.  4. The mitral valve is normal in structure. No evidence of mitral valve regurgitation. No evidence of mitral stenosis.  5. The aortic valve is normal in structure. There is mild calcification of the aortic valve. Aortic valve regurgitation is not visualized. No aortic stenosis is present.  6. The inferior vena cava is normal in size with greater than 50% respiratory variability, suggesting right atrial pressure of 3 mmHg.  FINDINGS  Left Ventricle: Left ventricular ejection fraction, by estimation, is 55 to 60%. The left ventricle has normal function. The left ventricle has no regional wall motion abnormalities. The left ventricular internal cavity size was normal in size. There is  no left ventricular hypertrophy. Left ventricular diastolic parameters were normal. Right Ventricle: The right ventricular size is mildly enlarged. No increase in right ventricular wall thickness. Right ventricular systolic function is mildly reduced. Left Atrium: Left atrial size was normal in size. Right Atrium: Right atrial size was normal in size. Pericardium:  There is no evidence of pericardial effusion. Mitral Valve: The mitral valve is normal in structure. No evidence of mitral valve regurgitation. No evidence of mitral valve stenosis. Tricuspid Valve: The tricuspid valve is normal in structure. Tricuspid valve regurgitation is not demonstrated. No evidence of tricuspid stenosis. Aortic Valve: The aortic valve is normal in structure. There is mild calcification of the aortic valve. There is mild to moderate aortic valve annular calcification. Aortic valve regurgitation is not visualized. No aortic stenosis is present. Aortic valve mean gradient measures 3.0 mmHg. Aortic valve peak gradient measures 7.6 mmHg. Aortic valve area, by VTI measures 2.28 cm. Pulmonic Valve: The pulmonic valve was normal in structure. Pulmonic valve regurgitation is not visualized. No evidence of pulmonic stenosis. Aorta: The aortic root is normal in size and structure. Venous: The inferior vena cava is normal in size with greater than 50% respiratory variability, suggesting right atrial pressure of 3 mmHg. IAS/Shunts: No atrial level shunt detected by color flow Doppler.  LEFT VENTRICLE PLAX 2D LVIDd:         4.90 cm   Diastology LVIDs:         3.10 cm   LV e' medial:    4.68 cm/s LV PW:         0.90 cm   LV E/e' medial:  20.6 LV IVS:        0.90 cm   LV e' lateral:    11.00 cm/s LVOT diam:     1.90 cm   LV E/e' lateral: 8.8 LV SV:         62 LV SV Index:   30 LVOT Area:     2.84 cm  RIGHT VENTRICLE RV S prime:     8.81 cm/s TAPSE (M-mode): 1.2 cm LEFT ATRIUM           Index LA Vol (A4C): 27.2 ml 13.11 ml/m  AORTIC VALVE AV Area (Vmax):    1.80 cm AV Area (Vmean):   2.00 cm AV Area (VTI):     2.28 cm AV Vmax:           138.00 cm/s AV Vmean:          84.800 cm/s AV VTI:            0.274 m AV Peak Grad:      7.6 mmHg AV Mean Grad:      3.0 mmHg LVOT Vmax:         87.70 cm/s LVOT Vmean:        59.900 cm/s LVOT VTI:          0.220 m LVOT/AV VTI ratio: 0.80  AORTA Ao Root diam: 3.20 cm Ao Asc diam:  3.00 cm MITRAL VALVE               TRICUSPID VALVE MV Area (PHT): 2.90 cm    TR Peak grad:   17.0 mmHg MV Decel Time: 262 msec    TR Vmax:        206.00 cm/s MV E velocity: 96.40 cm/s MV A velocity: 96.40 cm/s  SHUNTS MV E/A ratio:  1.00        Systemic VTI:  0.22 m                            Systemic Diam: 1.90 cm Aditya Sabharwal Electronically signed by Hebert Soho Signature Date/Time: 02/01/2023/4:17:31 PM    Final    EEG adult  Result  Date: 02/01/2023 Lora Havens, MD     02/01/2023 12:49 PM Patient Name: [name] [name] MRN: [id number] Epilepsy Attending: Lora Havens Referring Physician/Provider: Otelia Santee, NP Date: 02/01/2023 Duration:  22.14 mins Patient history: 67 year old man with past medical history of migraines, prior stroke with no residual deficits, anxiety with acute onset of confusion, not feeling right and severe unremitting headache. EEG to evaluate for seizure Level of alertness: Awake AEDs during EEG study: ZNS Technical aspects: This EEG study was done with scalp electrodes positioned according to the 10-20 International system of electrode placement. Electrical activity was reviewed with band pass filter of 1-70Hz , sensitivity of 7 uV/mm, display speed of 45mm/sec with a 60Hz  notched filter applied as appropriate. EEG data were recorded  continuously and digitally stored.  Video monitoring was available and reviewed as appropriate. Description: The posterior dominant rhythm consists of 8 Hz activity of moderate voltage (25-35 uV) seen predominantly in posterior head regions, symmetric and reactive to eye opening and eye closing. Hyperventilation and photic stimulation were not performed.   IMPRESSION: This study is within normal limits. No seizures or epileptiform discharges were seen throughout the recording. A normal interictal EEG does not exclude nor support the diagnosis of epilepsy. Lora Havens   MR BRAIN W WO CONTRAST  Result Date: 01/31/2023 CLINICAL DATA:  67 year old male with unrelenting headache, now improved. Code stroke presentation on 01/27/2023. Subtle but scattered and bilateral cortical restricted diffusion, signal abnormality on brain MRI at that time. Patient is status post fluoroscopic guided lumbar puncture yesterday with CSF negative for infection, protein mildly elevated. Query complex migraine. History also of cirrhosis, "Arthritis", diabetes. EXAM: MRI HEAD WITHOUT AND WITH CONTRAST TECHNIQUE: Multiplanar, multiecho pulse sequences of the brain and surrounding structures were obtained without and with intravenous contrast. CONTRAST:  50mL GADAVIST GADOBUTROL 1 MMOL/ML IV SOLN COMPARISON:  Noncontrast MRI 01/27/2023. MRV 01/28/2023. CTA head and neck 01/27/2023. FINDINGS: Brain: Ongoing abnormal bilateral vertex (superior frontal and superior parietal lobe) asymmetric scattered foci of cortical abnormal diffusion (series 5, image 94) with virtually the exact same areas affected as 4 days ago. No definite brand new areas of involvement are identified. There are several more inferiorly located lesions, including the lateral right central sulcus series 5, image 90, inferior left parietal lobe image 87, right inferior parietal lobe image 85. Furthermore, there might also be left-side unilateral posterior temporal and/or  lateral occipital lobe cortex involvement such as on series 5, images 76 and 74. Interestingly, and as before most of these areas are not apparent on T2/FLAIR, although there is subtle correlation today at the vertex on series 11, image 22. Following contrast, there is no cortical enhancement. And also no convincing leptomeningeal or pachymeningeal thickening or enhancement. Also, no evidence of mineralization or hemosiderin on SWI. No abnormal enhancement identified elsewhere in the brain. And otherwise stable gray and white matter signal. Nonspecific mild for age small white matter T2 and FLAIR hyperintense foci. Tiny chronic cerebellar lacunar infarcts again suggested. No superimposed no midline shift, mass effect, evidence of mass lesion, ventriculomegaly, extra-axial collection or acute intracranial hemorrhage. Cervicomedullary junction and pituitary are within normal limits. Vascular: Major intracranial vascular flow voids are stable. Following contrast the major dural venous sinuses are enhancing and appear to be patent. Skull and upper cervical spine: Negative for age visible cervical spine and spinal cord. Visualized bone marrow signal is within normal limits. Sinuses/Orbits: Bulky but stable sphenoid paranasal sinus disease with only mild sinus mucosal thickening elsewhere.  Chronic postoperative changes to both globes. Other: Mastoid air cells remain well aerated. Visible internal auditory structures appear normal. Visible scalp and face appear negative. IMPRESSION: 1. Unresolved unusual cortical diffusion abnormality, primarily bilaterally at the vertex but perhaps also subtle involvement lower down such as left temporal lobe. As before there is no significant gyral edema, very little correlation on T2/FLAIR. And there is no enhancement following contrast today, no evidence of an overlying meningeal abnormality following contrast. No definite brand new areas of involvement. 2. Otherwise stable MRI  appearance of the brain. 3. This was discussed by telephone with Dr. Amie Portland on 01/31/2023 at 12:38. 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 Also lack of enhancement would argue against infectious or inflammatory encephalitis. Large B-cell lymphoma remains possible, CSF cytology is pending. Hepatic encephalopathy according to the literature can rarely present with a cortical pattern of signal abnormality. Dr. Rory Percy advised this patient does Not have clinical symptoms of CJD. Electronically Signed   By: Genevie Ann M.D.   On: 01/31/2023 12:41     LOS: 6 days   Signature  -    Lala Lund M.D on 02/02/2023 at 9:51 AM   -  To page go to [email or web address]

## 2023-02-02 NOTE — Progress Notes (Signed)
Physical Therapy Treatment and Discharge Patient Details Name: [name] [name] MRN: [id number] DOB: [birth year] Today's Date: 02/02/2023   History of Present Illness Patient is a 67 y.o.  male who presented on 3/20 with 1 day history of confusion and headaches.  MRI showing small cortically based foci of restricted diffusion at the frontoparietal vertex in both size with broad differentials including that of vasculitis, autoimmune cerebritis, infectious meningitis and atypical CJD along with atypical watershed infarctions and CNS lymphoma (still pending definite diagnosis 3/26) LP pending. Demonstrated. 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 Pt able to perform bed mobility, transfers, and ambulation independently today, no AD, no LOB or unsteadiness. Pt reports no concerns with mobility upon discharge. Pt met all acute PT goals, with no further benefit from skilled acute PT during admission, encouraged pt to continue to mobilize as able. No current need for PT upon discharge, no DME needs identified. Acute PT signing off.     Recommendations for follow up therapy are one component of a multi-disciplinary discharge planning process, led by the attending physician.  Recommendations may be updated based on patient status, additional functional criteria and insurance authorization.  Follow Up Recommendations       Assistance Recommended at Discharge None  Patient can return home with the following     Equipment Recommendations  None recommended by PT    Recommendations for Other Services       Precautions / Restrictions Precautions Precautions: None Restrictions Weight Bearing Restrictions: No     Mobility  Bed Mobility Overal bed mobility: Independent             General bed mobility comments:  supine<>sit from flat bed, no rails    Transfers Overall transfer level: Independent                 General transfer comment: sit<>stand trials, pt utilizing sink to stand, cued to not reach out and completing independently again    Ambulation/Gait Ambulation/Gait assistance: Independent Gait Distance (Feet): 500 Feet Assistive device: None Gait Pattern/deviations: Step-through pattern Gait velocity: WFL     General Gait Details: pt reporting gait feels back to baseline, able to perform vertical and horizontal head turns without LOB, scanning hallways and changing gait speed, remaining stable and independent   Stairs             Wheelchair Mobility    Modified Rankin (Stroke Patients Only)       Balance Overall balance assessment: Independent                                          Cognition Arousal/Alertness: Awake/alert Behavior During Therapy: WFL for tasks assessed/performed Overall Cognitive Status: Within Functional Limits for tasks assessed                                 General Comments: A&Ox4, pleasant throughout, appears back to baseline        Exercises      General Comments General comments (skin integrity, edema, etc.): VSS on room air      Pertinent Vitals/Pain Pain Assessment Pain Assessment: Faces Faces Pain Scale:  No hurt    Home Living                          Prior Function            PT Goals (current goals can now be found in the care plan section) Acute Rehab PT Goals Patient Stated Goal: go home PT Goal Formulation: All assessment and education complete, DC therapy Progress towards PT goals: Goals met/education completed, patient discharged from PT    Frequency    Min 3X/week      PT Plan Current plan remains appropriate;Other (comment) (signing off)    Co-evaluation              AM-PAC PT "6 Clicks" Mobility   Outcome Measure  Help needed turning  from your back to your side while in a flat bed without using bedrails?: None Help needed moving from lying on your back to sitting on the side of a flat bed without using bedrails?: None Help needed moving to and from a bed to a chair (including a wheelchair)?: None Help needed standing up from a chair using your arms (e.g., wheelchair or bedside chair)?: None Help needed to walk in hospital room?: None Help needed climbing 3-5 steps with a railing? : None 6 Click Score: 24    End of Session   Activity Tolerance: Patient tolerated treatment well Patient left: in bed;with call bell/phone within reach Nurse Communication: Mobility status PT Visit Diagnosis: Unsteadiness on feet (R26.81);Muscle weakness (generalized) (M62.81);Difficulty in walking, not elsewhere classified (R26.2)     Time: [id number] PT Time Calculation (min) (ACUTE ONLY): 12 min  Charges:  $Therapeutic Activity: 8-22 mins                     Charlynne Cousins, PT DPT Acute Rehabilitation Services Office [phone]    Luvenia Heller 02/02/2023, 3:53 PM

## 2023-02-02 NOTE — Progress Notes (Signed)
AVS reviewed with pt, by charge RN Antony Haste. Pt left unit via private vehicle with daughter-in-law Faith, in NAD.

## 2023-02-03 ENCOUNTER — Telehealth: Payer: Self-pay | Admitting: *Deleted

## 2023-02-03 ENCOUNTER — Telehealth: Payer: Self-pay

## 2023-02-03 ENCOUNTER — Encounter: Payer: Self-pay | Admitting: *Deleted

## 2023-02-03 LAB — CSF CULTURE W GRAM STAIN: Culture: NO GROWTH

## 2023-02-03 LAB — IGG CSF INDEX
Albumin CSF-mCnc: 42 mg/dL (ref 15–55)
Albumin: 3.3 g/dL — ABNORMAL LOW (ref 3.9–4.9)
CSF IgG Index: 0.5 (ref 0.0–0.7)
IgG (Immunoglobin G), Serum: 1037 mg/dL (ref 603–1613)
IgG, CSF: 7.2 mg/dL (ref 0.0–10.3)
IgG/Alb Ratio, CSF: 0.17 (ref 0.00–0.25)

## 2023-02-03 NOTE — Transitions of Care (Post Inpatient/ED Visit) (Signed)
   02/03/2023  Name: [name] [name] MRN: [id number] DOB: [birth year]  Today's TOC FU Call Status: Today's TOC FU Call Status:: Successful TOC FU Call Competed TOC FU Call Complete Date: 02/03/23  Transition Care Management Follow-up Telephone Call Date of Discharge: 02/02/23 Discharge Facility: [facility]) Type of Discharge: Inpatient Admission Primary Inpatient Discharge Diagnosis:: Acute metabolic encephalopathy How have you been since you were released from the hospital?: Better Any questions or concerns?: No  Items Reviewed: Did you receive and understand the discharge instructions provided?: Yes Medications obtained and verified?: Yes (Medications Reviewed) Any new allergies since your discharge?: No Dietary orders reviewed?: NA Do you have support at home?: Yes People in Home: spouse Name of Support/Comfort Primary Source: [facility] and Equipment/Supplies: [place] Ordered?: No Any new equipment or medical supplies ordered?: No  Functional Questionnaire: Do you need assistance with bathing/showering or dressing?: No Do you need assistance with meal preparation?: No Do you need assistance with eating?: No Do you have difficulty maintaining continence: No Do you need assistance with getting out of bed/getting out of a chair/moving?: No Do you have difficulty managing or taking your medications?: No  Follow up appointments reviewed: PCP Follow-up appointment confirmed?: Yes Date of PCP follow-up appointment?: 02/09/23 Follow-up Provider: Juanda Chance, [facility] Follow-up appointment confirmed?: No Reason Specialist Follow-Up Not Confirmed: Patient has Specialist Provider Number and will Call for Appointment (Referral to [facility] is pending scheduling. Patient advised to call them at [phone] if he doesn't hear from them in the next couple of days) Do you need transportation to your follow-up appointment?:  No Do you understand care options if your condition(s) worsen?: Yes-patient verbalized understanding  SDOH Interventions Today    Flowsheet Row Most Recent Value  SDOH Interventions   Food Insecurity Interventions Intervention Not Indicated  Transportation Interventions Intervention Not Indicated  Financial Strain Interventions Intervention Not Indicated      TOC Interventions Today    Flowsheet Row Most Recent Value  TOC Interventions   TOC Interventions Discussed/Reviewed TOC Interventions Discussed, TOC Interventions Reviewed, Arranged PCP follow up within 7 days/Care Guide scheduled      Interventions Today    Flowsheet Row Most Recent Value  Chronic Disease   Chronic disease during today's visit Other  [hospitalization for Acute metabolic encephalopathy]  Safety Interventions   Safety Discussed/Reviewed Safety Discussed      Chong Sicilian, BSN, RN-BC RN Care Coordinator [place]: [phone] Main #: [phone]

## 2023-02-03 NOTE — Patient Outreach (Signed)
02/03/23   EUCLID ROSTKOWSKI [birth year] [id number]   [facility] [ACO] Patient: [name] [name] Hospital Liaison remote coverage review for patient admitted to [facility]  Primary Care Provider:  [name], MD with [facility]   Patient screened for hospitalization with noted high risk score for unplanned readmission risk length of stay and to assess for potential [place] [facility] service needs for post hospital transition for care coordination.  Review of patient's electronic medical record reveals patient is home last evening.  Spoke with the patient via phone listed and HIPAA verified to explained [facility]'s role in post hospital follow up for needs.  Patient express feeling alright today.  Patient verbalized being able to call.  Explained to patient to anticipate calls to follow up on specific needs.  Verbalized understanding for follow up.  Plan:  Continue to follow progress and disposition to assess for post hospital community care coordination/management needs.  Referral request for community care coordination:  yes.  Of note, [facility] Care Management/Population Health does not replace or interfere with any arrangements made by the Inpatient Transition of Care team.  For questions contact:   Natividad Brood, RN BSN [place]  [phone] business mobile phone Toll free office [phone]  *[place]  [phone] Fax number: [phone] [email or web address]

## 2023-02-05 LAB — CULTURE, BLOOD (ROUTINE X 2)
Culture: NO GROWTH
Culture: NO GROWTH
Special Requests: ADEQUATE

## 2023-02-09 ENCOUNTER — Ambulatory Visit (INDEPENDENT_AMBULATORY_CARE_PROVIDER_SITE_OTHER): Payer: 59 | Admitting: Family Medicine

## 2023-02-09 ENCOUNTER — Ambulatory Visit (HOSPITAL_COMMUNITY)
Admission: RE | Admit: 2023-02-09 | Discharge: 2023-02-09 | Disposition: A | Discharge status: Home / Self Care | Payer: 59 | Source: Ambulatory Visit | Attending: Family Medicine | Admitting: Family Medicine

## 2023-02-09 ENCOUNTER — Ambulatory Visit: Payer: 59 | Admitting: Internal Medicine

## 2023-02-09 ENCOUNTER — Encounter: Payer: Self-pay | Admitting: Family Medicine

## 2023-02-09 ENCOUNTER — Telehealth: Payer: Self-pay | Admitting: Internal Medicine

## 2023-02-09 VITALS — BP 91/59 | HR 80 | Ht 69.0 in | Wt 198.0 lb

## 2023-02-09 DIAGNOSIS — Z8669 Personal history of other diseases of the nervous system and sense organs: Secondary | ICD-10-CM | POA: Insufficient documentation

## 2023-02-09 DIAGNOSIS — G9340 Encephalopathy, unspecified: Secondary | ICD-10-CM | POA: Diagnosis not present

## 2023-02-09 DIAGNOSIS — Z09 Encounter for follow-up examination after completed treatment for conditions other than malignant neoplasm: Secondary | ICD-10-CM

## 2023-02-09 DIAGNOSIS — G9341 Metabolic encephalopathy: Secondary | ICD-10-CM | POA: Diagnosis not present

## 2023-02-09 DIAGNOSIS — R059 Cough, unspecified: Secondary | ICD-10-CM | POA: Diagnosis not present

## 2023-02-09 NOTE — Assessment & Plan Note (Addendum)
Patient is alert and orientated x 3  Labs and xray ordered Refer to Neurology as per hospitalist note Blood pressure well controlled.         Discussed signs and symptoms of major cardiovascular or neurology event and need to present to the ED if note any new symptoms.Patient verbalizes regarding plan of care and all questions answered.

## 2023-02-09 NOTE — Patient Instructions (Signed)
It was pleasure meeting with you today. Follow up with neurology. I will update you on your lab results and chest xray. Follow up with your primary health provider if any health concerns arises.

## 2023-02-09 NOTE — Progress Notes (Signed)
Patient Office Visit   Subjective   Patient ID: [name] [name], male    DOB: February 28, 1956  Age: 67 y.o. MRN: [id number]  CC:  Chief Complaint  Patient presents with   Follow-up    Patient was in the hospital for 6 days starting 3/20 for confusion/ encephalopathy.     HPI [name] [name] 67 year old male presents to the clinic for hospital follow up with discharge date on 3/26. 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  HPI  Follow up ER visit  Patient was seen in ER for Intractable headache with metabolic encephalopathy and suggestive RCVS on 01/27/23. He was treated for intractable headache with metabolic encephalopathy patient was given 10 mg Decadron headache was resolved with this treatment. 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 He reports good compliance with treatment. He reports this condition is Improved.  -----------------------------------------------------------------------------------------   Outpatient Encounter Medications as of 02/09/2023  Medication Sig   acetaminophen (TYLENOL) 500 MG tablet Take 1 tablet (500 mg total) by mouth every 8 (eight) hours as needed for moderate pain or headache.   albuterol (VENTOLIN HFA) 108 (90 Base) MCG/ACT  inhaler Inhale 2 puffs into the lungs every 6 (six) hours as needed for wheezing or shortness of breath.   apixaban (ELIQUIS) 5 MG TABS tablet 2 po bid thru 2/25, then 1 po BID afterwards   carvedilol (COREG) 3.125 MG tablet Take 1 tablet (3.125 mg total) by mouth 2 (two) times daily.   citalopram (CELEXA) 20 MG tablet Take 1 tablet (20 mg total) by mouth at bedtime.   Continuous Blood Gluc Receiver (FREESTYLE LIBRE 2 READER) DEVI 1 Device by Does not apply route continuous.   Continuous Blood Gluc Sensor (FREESTYLE LIBRE 2 SENSOR) MISC Apply one sensor every 14 days   cyclobenzaprine (FLEXERIL) 10 MG tablet Take 1 tablet (10 mg total) by mouth 3 (three) times daily as needed for muscle spasms.   dapagliflozin propanediol (FARXIGA) 5 MG TABS tablet Take 1 tablet (5 mg total) by mouth daily before breakfast.   ezetimibe (ZETIA) 10 MG tablet Take 1 tablet (10 mg total) by mouth daily.   fluticasone (FLONASE) 50 MCG/ACT nasal spray Place 2 sprays into both nostrils 2 (two) times daily.   Fluticasone-Salmeterol (ADVAIR) 250-50 MCG/DOSE AEPB Inhale 1 puff into the lungs every 12 (twelve) hours.   gabapentin (NEURONTIN) 300 MG capsule Take 1 capsule (300 mg total) by mouth 3 (three) times daily. Take 300 mg by mouth 3 (three) times daily.   isosorbide mononitrate (IMDUR) 60 MG 24 hr tablet Take one (1) tablet by mouth ( 60 mg ) in the am and one half tablet ( 30 mg) in the pm.   Melatonin 10 MG TABS Take 10 mg by mouth at bedtime.   metFORMIN (GLUCOPHAGE) 1000 MG tablet Take 1 tablet (1,000 mg total) by mouth 2 (two) times daily.   nitroGLYCERIN (NITROSTAT) 0.4 MG  SL tablet Place 1 tablet (0.4 mg total) under the tongue every 5 (five) minutes x 3 doses as needed for chest pain (if no relief after 2nd dose, proceed to the ED for an evaluation or call 911). Place 0.4 mg under the tongue every 5 (five) minutes x 3 doses as needed for chest pain (if no relief after 2nd dose, proceed to the ED for an evaluation  or call 911).   omeprazole (PRILOSEC) 40 MG capsule Take 1 capsule (40 mg total) by mouth daily.   polycarbophil (FIBERCON) 625 MG tablet Take 625 mg by mouth daily.   rosuvastatin (CRESTOR) 20 MG tablet Take 1 tablet (20 mg total) by mouth daily.   tirzepatide [name]) 5 MG/0.5ML Pen Inject 5 mg into the skin once a week.   zonisamide (ZONEGRAN) 100 MG capsule Take 100 mg by mouth daily.   No facility-administered encounter medications on file as of 02/09/2023.    Past Surgical History:  Procedure Laterality Date   ANTERIOR CERVICAL DECOMP/DISCECTOMY FUSION  1998   "C3-4"   CARDIAC CATHETERIZATION  "several"   CARDIAC CATHETERIZATION N/A 12/30/2015   Procedure: Left Heart Cath and Coronary Angiography;  Surgeon: [name], MD;  Location: [place] [facility];  Service: Cardiovascular;  Laterality: N/A;   CARPAL TUNNEL RELEASE Bilateral 2005   CHOLECYSTECTOMY N/A 11/28/2018   Procedure: LAPAROSCOPIC CHOLECYSTECTOMY;  Surgeon: [name], MD;  Location: [facility];  Service: General;  Laterality: N/A;   COLONOSCOPY     COLONOSCOPY WITH PROPOFOL N/A 09/27/2020   Procedure: COLONOSCOPY WITH PROPOFOL;  Surgeon: [name], MD;  Location: [facility];  Service: Gastroenterology;  Laterality: N/A;   CORONARY ANGIOPLASTY     CORONARY ANGIOPLASTY WITH STENT PLACEMENT  2002; 2003; 11/20/2014   "I have 4 stents after today" (11/20/2014)   CORONARY ARTERY BYPASS GRAFT N/A 01/09/2016   Procedure: CORONARY ARTERY BYPASS GRAFTING (CABG) X 3 UTILIZING RIGHT AND LEFT INTERNAL MAMMARY ARTERY AND ENDOSCOPICALLY HARVESTED SAPHENEOUS VEIN.;  Surgeon: Rexene Alberts, MD;  Location: [place];  Service: Open Heart Surgery;  Laterality: N/A;   ESOPHAGOGASTRODUODENOSCOPY     ESOPHAGOGASTRODUODENOSCOPY (EGD) WITH PROPOFOL N/A 09/27/2020   Procedure: ESOPHAGOGASTRODUODENOSCOPY (EGD) WITH PROPOFOL;  Surgeon: [name], MD;  [facility];  Service: Gastroenterology;   Laterality: N/A;  10   KNEE SURGERY Left 02/2012   "scraped; open"   LEFT HEART CATH AND CORS/GRAFTS ANGIOGRAPHY N/A 08/09/2017   Procedure: LEFT HEART CATH AND CORS/GRAFTS ANGIOGRAPHY;  Surgeon: [name], MD;  Location: [place] [facility];  Service: Cardiovascular;  Laterality: N/A;   LEFT HEART CATH AND CORS/GRAFTS ANGIOGRAPHY N/A 11/22/2018   Procedure: LEFT HEART CATH AND CORS/GRAFTS ANGIOGRAPHY;  Surgeon: [name], [name], MD;  Location: [place] [facility];  Service: Cardiovascular;  Laterality: N/A;   LEFT HEART CATH AND CORS/GRAFTS ANGIOGRAPHY N/A 08/20/2022   Procedure: LEFT HEART CATH AND CORS/GRAFTS ANGIOGRAPHY;  Surgeon: Burnell Blanks, MD;  Location: [place] [facility];  Service: Cardiovascular;  Laterality: N/A;   LEFT HEART CATHETERIZATION WITH CORONARY ANGIOGRAM N/A 07/20/2012   Procedure: LEFT HEART CATHETERIZATION WITH CORONARY ANGIOGRAM;  Surgeon: [name], MD;  Location: [place] C[facility] LAB;  Service: Cardiovascular;  Laterality: N/A;   LEFT HEART CATHETERIZATION WITH CORONARY ANGIOGRAM N/A 11/20/2014   Procedure: LEFT HEART CATHETERIZATION WITH CORONARY ANGIOGRAM;  Surgeon: [name], MD;  Location: [facility] LAB;  Service: Cardiovascular;  Laterality: N/A;   LEFT HEART CATHETERIZATION WITH CORONARY  ANGIOGRAM N/A 11/26/2014   Procedure: LEFT HEART CATHETERIZATION WITH CORONARY ANGIOGRAM;  Surgeon: [name], MD;  Location: [facility];  Service: Cardiovascular;  Laterality: N/A;   NEUROPLASTY / TRANSPOSITION ULNAR NERVE AT ELBOW Right ~ 2012   PERCUTANEOUS CORONARY ROTOBLATOR INTERVENTION (PCI-R)  11/20/2014   Procedure: PERCUTANEOUS CORONARY ROTOBLATOR INTERVENTION (PCI-R);  Surgeon: [name], MD;  Location: [facility] CATH LAB;  Service: Cardiovascular;;   POSTERIOR CERVICAL LAMINECTOMY Left 04/16/2022   Procedure: Laminectomy and Foraminotomy - left - C6-C7;  Surgeon: Earnie Larsson, MD;  Location: [place];  Service: Neurosurgery;  Laterality: Left;  3C   SHOULDER  ARTHROSCOPY Left ~ 2011   TEE WITHOUT CARDIOVERSION N/A 01/09/2016   Procedure: TRANSESOPHAGEAL ECHOCARDIOGRAM (TEE);  Surgeon: Rexene Alberts, MD;  Location: [place];  Service: Open Heart Surgery;  Laterality: N/A;    Review of Systems  Constitutional:  Negative for chills and fever.  Eyes:  Negative for blurred vision and double vision.  Respiratory:  Negative for shortness of breath.   Cardiovascular:  Negative for chest pain.  Gastrointestinal:  Negative for heartburn and nausea.  Genitourinary:  Negative for dysuria.  Musculoskeletal:  Negative for myalgias.  Neurological:  Negative for dizziness, sensory change, speech change, seizures, loss of consciousness, weakness and headaches.      Objective    BP (!) 91/59   Pulse 80   Ht 5\' 9"  (1.753 m)   Wt 198 lb (89.8 kg)   SpO2 96%   BMI 29.24 kg/m   Physical Exam Eyes:     Extraocular Movements: Extraocular movements intact.     Pupils: Pupils are equal, round, and reactive to light.  Cardiovascular:     Rate and Rhythm: Normal rate and regular rhythm.     Pulses: Normal pulses.     Heart sounds: Normal heart sounds.  Pulmonary:     Effort: Pulmonary effort is normal.     Breath sounds: Normal breath sounds.  Musculoskeletal:        General: Normal range of motion.     Cervical back: Normal range of motion.     Right lower leg: No edema.     Left lower leg: No edema.  Skin:    General: Skin is warm.     Capillary Refill: Capillary refill takes less than 2 seconds.  Neurological:     Mental Status: He is alert and oriented to person, place, and time.     Cranial Nerves: No cranial nerve deficit, dysarthria or facial asymmetry.     Sensory: No sensory deficit.     Motor: No weakness or tremor.     Coordination: Coordination normal.     Gait: Gait normal.  Psychiatric:        Mood and Affect: Mood normal.        Thought Content: Thought content normal.       Assessment & Plan:  Hx of encephalopathy -     DG  Chest 2 View; Future -     Comprehensive metabolic panel -     Magnesium -     CBC with Differential/Platelet -     Ambulatory referral to Neurology  Hospital discharge follow-up Assessment & Plan: Patient is alert and orientated x 3  Labs and xray ordered Refer to Neurology as per hospitalist note Blood pressure well controlled.         Discussed signs and symptoms of major cardiovascular or neurology event and need to present to the  ED if note any new symptoms.Patient verbalizes regarding plan of care and all questions answered.      No follow-ups on file.   Renard Hamper Ria Comment, FNP

## 2023-02-09 NOTE — Telephone Encounter (Signed)
[facility] called in on patient behalf in regard to referral.  Patient has bad debt with office will need referral send elsewhere

## 2023-02-10 ENCOUNTER — Telehealth: Payer: Self-pay | Admitting: Internal Medicine

## 2023-02-10 LAB — CBC WITH DIFFERENTIAL/PLATELET

## 2023-02-10 NOTE — Telephone Encounter (Signed)
Patient dropped off document  vaccine approval form , to be filled out by provider. Patient requested to send it via Fax within ASAP. Document is located in providers tray at front office.Please advise at [facility] [phone]    Please contact pt to pick up copy & please fax to [phone]

## 2023-02-11 ENCOUNTER — Other Ambulatory Visit: Payer: Self-pay

## 2023-02-11 ENCOUNTER — Ambulatory Visit: Payer: 59

## 2023-02-11 ENCOUNTER — Other Ambulatory Visit: Payer: Self-pay | Admitting: Internal Medicine

## 2023-02-11 DIAGNOSIS — Z8669 Personal history of other diseases of the nervous system and sense organs: Secondary | ICD-10-CM

## 2023-02-11 LAB — CBC WITH DIFFERENTIAL/PLATELET
Basophils Absolute: 0 10*3/uL (ref 0.0–0.2)
Basos: 1 %
EOS (ABSOLUTE): 0.2 10*3/uL (ref 0.0–0.4)
Eos: 5 %
Hematocrit: 41.3 % (ref 37.5–51.0)
Hemoglobin: 14.5 g/dL (ref 13.0–17.7)
Immature Grans (Abs): 0 10*3/uL (ref 0.0–0.1)
Immature Granulocytes: 0 %
Lymphocytes Absolute: 0.9 10*3/uL (ref 0.7–3.1)
Lymphs: 29 %
MCH: 31.9 pg (ref 26.6–33.0)
MCHC: 35.1 g/dL (ref 31.5–35.7)
MCV: 91 fL (ref 79–97)
Monocytes Absolute: 0.2 10*3/uL (ref 0.1–0.9)
Monocytes: 5 %
Neutrophils Absolute: 1.8 10*3/uL (ref 1.4–7.0)
Neutrophils: 60 %
Platelets: 62 10*3/uL — CL (ref 150–450)
RBC: 4.54 x10E6/uL (ref 4.14–5.80)
RDW: 15.1 % (ref 11.6–15.4)
WBC: 3 10*3/uL — ABNORMAL LOW (ref 3.4–10.8)

## 2023-02-11 LAB — MAGNESIUM: Magnesium: 1.7 mg/dL (ref 1.6–2.3)

## 2023-02-11 NOTE — Telephone Encounter (Signed)
Sent ref to [facility].

## 2023-02-17 NOTE — Progress Notes (Unsigned)
Cardiology Office Note    Date:  02/18/2023  ID:  [name] [name], DOB Jan 25, 1956, MRN [id number] Cardiologist: Nona DellSamuel McDowell, MD    History of Present Illness:    [name] [name] is a 67 y.o. male with past medical history of CAD (s/p CABG in 2017, s/p cath in 08/2022 showing 3/3 patent grafts with medical management recommended), HTN, HLD, Type 2 DM, HFpEF, aortic regurgitation, PE/DVT (diagnosed in 12/2022), cirrhosis, GERD, hiatal hernia and anemia who presents to the office today for hospital follow-up.  He had a Virtual Visit [place] Cadence Furth, [place] in 12/2022 due to having COVID-19 at that time. He reported some persistent chest pain which could present as a pressure or sharp discomfort. No changes were made to his cardiac medications and he was continued on ASA 81 mg daily, Coreg 3.125 mg twice daily, Farxiga 5 mg daily, Zetia 10 mg daily, Imdur 60 mg in AM/30 mg in PM and Pravastatin 20 mg daily..  In the interim, he was admitted to [facility] from 2/17 - 12/29/2022 for an acute pulmonary embolism and DVT along his left leg.  Repeat echocardiogram showed no evidence of right heart strain at that time.  Cardiology was consulted and recommended stopping ASA given the need for Eliquis.  He did complete a course of Paxlovid given his recent COVID-19 virus infection.  Was again admitted from 3/20 - 02/02/2023 for evaluation of worsening weakness and a headache with Code Stroke activated. MRI brain showed a small foci of restricted diffusion at the cortical frontal parietal cortex bilaterally and Neurology was consulted to assist with management. At one point, he was empirically covered for meningeal encephalitis but LP was reassuring and not consistent with an active infection. No changes were made to his cardiac regimen at that time.  In talking with the patient and his wife today, he reports overall doing well since his recent hospitalization. He denies any recurrent headaches. Is awaiting to  hear back from [place] Neurologist in [place] for a follow-up appointment. He denies any recent chest pain or dyspnea on exertion. No recent orthopnea, PND or pitting edema. He is awaiting to undergo a home sleep study which is being arranged by Pulmonology. He remains on Eliquis for anticoagulation and denies any recent melena, hematochezia or hematuria.   Studies Reviewed:   EKG: EKG is not ordered today. EKG from 01/27/2023 is reviewed and demonstrates NSR, HR 79 with no acute ST changes.   LHC: 08/2022 Mid LAD lesion is 90% stenosed.   Ost LAD lesion is 50% stenosed.   Prox Cx lesion is 50% stenosed.   Mid RCA lesion is 40% stenosed.   Dist RCA lesion is 40% stenosed.   Origin to Prox Graft lesion is 40% stenosed.   1st Diag lesion is 80% stenosed.   Ost RPDA lesion is 100% stenosed.   Prox LAD to Mid LAD lesion is 100% stenosed.   1st Mrg lesion is 100% stenosed.   SVG graft was visualized by angiography and is normal in caliber.   LIMA graft was visualized by angiography and is normal in caliber.   RIMA graft was visualized by angiography and is normal in caliber.   Severe triple vessel CAD s/p 3V CABG with 3/3 patent bypass grafts Chronic occlusion mid LAD. The mid and distal LAD fills from the patent LIMA graft Chronic occlusion obtuse marginal branch in the previously stented segment. Patent free RIMA graft to the obtuse marginal branch Large dominant RCA with mild  diffuse disease. The PDA is chronically occluded. Patent SVG to PDA.    Recommendations: Continue medical management of CAD  Echocardiogram: 01/2023 IMPRESSIONS     1. Technically difficult study with poor visualization of cardiac  structures.   2. Left ventricular ejection fraction, by estimation, is 55 to 60%. The  left ventricle has normal function. The left ventricle has no regional  wall motion abnormalities. Left ventricular diastolic parameters were  normal.   3. Right ventricular systolic function is  mildly reduced. The right  ventricular size is mildly enlarged.   4. The mitral valve is normal in structure. No evidence of mitral valve  regurgitation. No evidence of mitral stenosis.   5. The aortic valve is normal in structure. There is mild calcification  of the aortic valve. Aortic valve regurgitation is not visualized. No  aortic stenosis is present.   6. The inferior vena cava is normal in size with greater than 50%  respiratory variability, suggesting right atrial pressure of 3 mmHg.     Physical Exam:   VS:  BP (!) 106/56   Pulse 77   Ht 5\' 9"  (1.753 m)   Wt 195 lb 3.2 oz (88.5 kg)   SpO2 97%   BMI 28.83 kg/m    Wt Readings from Last 3 Encounters:  02/18/23 195 lb 3.2 oz (88.5 kg)  02/09/23 198 lb (89.8 kg)  01/27/23 202 lb (91.6 kg)     GEN: Pleasant male appearing in no acute distress NECK: No JVD; No carotid bruits CARDIAC: RRR, no murmurs, rubs, gallops RESPIRATORY:  Clear to auscultation without rales, wheezing or rhonchi  ABDOMEN: Appears non-distended. No obvious abdominal masses. EXTREMITIES: No clubbing or cyanosis. No pitting edema.  Distal pedal pulses are 2+ bilaterally.   Assessment and Plan:   1. CAD - He is s/p CABG in 2017 and recent cardiac catheterization in 08/2022 showed 3/3 patent grafts with medical management recommended as outlined above.  - He denies any recent anginal symptoms and echocardiogram last month showed a preserved EF of 55 to 60% with no regional wall motion abnormalities. - Continue current medical therapy with Coreg 3.125 mg twice daily, Zetia 10 mg daily, Imdur 60 mg in AM/30 mg in PM and Crestor 20 mg daily. He is not currently on ASA given the need for anticoagulation.  2. HTN - His blood pressure is well-controlled at 106/56 during today's visit he denies any associated dizziness or presyncope. Continue current medical therapy with Coreg 3.125 mg twice daily and Imdur 60 mg in AM/30 mg in PM.  3. HLD - FLP last month  showed total cholesterol 87, triglycerides 96, HDL 32 and LDL 36. He was switched from Pravastatin to Crestor 20 mg daily and remains on Zetia 10 mg daily. Will recheck an FLP with his next set of labs which is scheduled for next month. If LDL remains well-controlled, can possibly stop Zetia.  4. Aortic Regurgitation - Echocardiogram in 12/2022 was read as showing moderate AI but was not mentioned in his echocardiogram report from 01/2023. Would plan for repeat imaging within the next year.  5. History of HFpEF - He appears euvolemic on examination today. No longer on diuretic therapy. He has lost a significant amount of weight since being on [name].   6. PE/DVT - This was diagnosed in 12/2022 in the setting of COVID-19 but he reports having dyspnea prior to his COVID diagnosis. Remains on Eliquis 5mg  BID for anticoagulation. He does have follow-up scheduled with Hematology  in 03/2023.    Signed, Ellsworth Lennox, PA-C

## 2023-02-18 ENCOUNTER — Ambulatory Visit: Payer: 59 | Attending: Medical | Admitting: Student

## 2023-02-18 ENCOUNTER — Encounter: Payer: Self-pay | Admitting: Student

## 2023-02-18 VITALS — BP 106/56 | HR 77 | Ht 69.0 in | Wt 195.2 lb

## 2023-02-18 DIAGNOSIS — Z8679 Personal history of other diseases of the circulatory system: Secondary | ICD-10-CM

## 2023-02-18 DIAGNOSIS — E785 Hyperlipidemia, unspecified: Secondary | ICD-10-CM | POA: Diagnosis not present

## 2023-02-18 DIAGNOSIS — I2510 Atherosclerotic heart disease of native coronary artery without angina pectoris: Secondary | ICD-10-CM

## 2023-02-18 DIAGNOSIS — I2699 Other pulmonary embolism without acute cor pulmonale: Secondary | ICD-10-CM

## 2023-02-18 DIAGNOSIS — I351 Nonrheumatic aortic (valve) insufficiency: Secondary | ICD-10-CM

## 2023-02-18 DIAGNOSIS — I10 Essential (primary) hypertension: Secondary | ICD-10-CM

## 2023-02-18 NOTE — Patient Instructions (Signed)
Medication Instructions:  Your physician recommends that you continue on your current medications as directed. Please refer to the Current Medication list given to you today.  *If you need a refill on your cardiac medications before your next appointment, please call your pharmacy*   Lab Work: Your physician recommends that you return for lab work in: Lipid with next blood draw.   If you have labs (blood work) drawn today and your tests are completely normal, you will receive your results only by: MyChart Message (if you have MyChart) OR A paper copy in the mail If you have any lab test that is abnormal or we need to change your treatment, we will call you to review the results.   Testing/Procedures: NONE     Follow-Up: At [facility], you and your health needs are our priority.  As part of our continuing mission to provide you with exceptional heart care, we have created designated Provider Care Teams.  These Care Teams include your primary Cardiologist (physician) and Advanced Practice Providers (APPs -  Physician Assistants and Nurse Practitioners) who all work together to provide you with the care you need, when you need it.  We recommend signing up for the patient portal called "MyChart".  Sign up information is provided on this After Visit Summary.  MyChart is used to connect with patients for Virtual Visits (Telemedicine).  Patients are able to view lab/test results, encounter notes, upcoming appointments, etc.  Non-urgent messages can be sent to your provider as well.   To learn more about what you can do with MyChart, go to [email or web address].    Your next appointment:   6 month(s)  Provider:   [name], MD or [name], PA-C    Other Instructions Thank you for choosing [facility]!

## 2023-02-26 DIAGNOSIS — J069 Acute upper respiratory infection, unspecified: Secondary | ICD-10-CM | POA: Diagnosis not present

## 2023-02-26 DIAGNOSIS — A084 Viral intestinal infection, unspecified: Secondary | ICD-10-CM | POA: Diagnosis not present

## 2023-02-28 DIAGNOSIS — R1110 Vomiting, unspecified: Secondary | ICD-10-CM | POA: Diagnosis not present

## 2023-02-28 DIAGNOSIS — Z86711 Personal history of pulmonary embolism: Secondary | ICD-10-CM | POA: Diagnosis not present

## 2023-02-28 DIAGNOSIS — Z7901 Long term (current) use of anticoagulants: Secondary | ICD-10-CM | POA: Diagnosis not present

## 2023-02-28 DIAGNOSIS — R112 Nausea with vomiting, unspecified: Secondary | ICD-10-CM | POA: Diagnosis not present

## 2023-02-28 DIAGNOSIS — Z9049 Acquired absence of other specified parts of digestive tract: Secondary | ICD-10-CM | POA: Diagnosis not present

## 2023-02-28 DIAGNOSIS — E119 Type 2 diabetes mellitus without complications: Secondary | ICD-10-CM | POA: Diagnosis not present

## 2023-02-28 DIAGNOSIS — Z794 Long term (current) use of insulin: Secondary | ICD-10-CM | POA: Diagnosis not present

## 2023-02-28 DIAGNOSIS — Z955 Presence of coronary angioplasty implant and graft: Secondary | ICD-10-CM | POA: Diagnosis not present

## 2023-02-28 DIAGNOSIS — Z7951 Long term (current) use of inhaled steroids: Secondary | ICD-10-CM | POA: Diagnosis not present

## 2023-02-28 DIAGNOSIS — J189 Pneumonia, unspecified organism: Secondary | ICD-10-CM | POA: Insufficient documentation

## 2023-02-28 DIAGNOSIS — I2699 Other pulmonary embolism without acute cor pulmonale: Secondary | ICD-10-CM | POA: Diagnosis not present

## 2023-02-28 DIAGNOSIS — Z86718 Personal history of other venous thrombosis and embolism: Secondary | ICD-10-CM | POA: Diagnosis not present

## 2023-02-28 DIAGNOSIS — Z20822 Contact with and (suspected) exposure to covid-19: Secondary | ICD-10-CM | POA: Diagnosis not present

## 2023-02-28 DIAGNOSIS — Z8616 Personal history of COVID-19: Secondary | ICD-10-CM | POA: Diagnosis not present

## 2023-02-28 DIAGNOSIS — G4733 Obstructive sleep apnea (adult) (pediatric): Secondary | ICD-10-CM | POA: Diagnosis not present

## 2023-02-28 DIAGNOSIS — I5032 Chronic diastolic (congestive) heart failure: Secondary | ICD-10-CM | POA: Diagnosis not present

## 2023-02-28 DIAGNOSIS — Z951 Presence of aortocoronary bypass graft: Secondary | ICD-10-CM | POA: Diagnosis not present

## 2023-02-28 DIAGNOSIS — K7581 Nonalcoholic steatohepatitis (NASH): Secondary | ICD-10-CM | POA: Diagnosis not present

## 2023-02-28 DIAGNOSIS — I110 Hypertensive heart disease with heart failure: Secondary | ICD-10-CM | POA: Diagnosis not present

## 2023-02-28 DIAGNOSIS — I5030 Unspecified diastolic (congestive) heart failure: Secondary | ICD-10-CM | POA: Diagnosis not present

## 2023-02-28 DIAGNOSIS — R109 Unspecified abdominal pain: Secondary | ICD-10-CM | POA: Diagnosis not present

## 2023-02-28 DIAGNOSIS — Z79899 Other long term (current) drug therapy: Secondary | ICD-10-CM | POA: Diagnosis not present

## 2023-02-28 DIAGNOSIS — I700 Atherosclerosis of aorta: Secondary | ICD-10-CM | POA: Diagnosis not present

## 2023-02-28 DIAGNOSIS — R6883 Chills (without fever): Secondary | ICD-10-CM | POA: Diagnosis not present

## 2023-02-28 DIAGNOSIS — E785 Hyperlipidemia, unspecified: Secondary | ICD-10-CM | POA: Diagnosis not present

## 2023-02-28 DIAGNOSIS — G8929 Other chronic pain: Secondary | ICD-10-CM | POA: Diagnosis not present

## 2023-02-28 DIAGNOSIS — J1569 Pneumonia due to other gram-negative bacteria: Secondary | ICD-10-CM | POA: Diagnosis not present

## 2023-02-28 DIAGNOSIS — R1010 Upper abdominal pain, unspecified: Secondary | ICD-10-CM | POA: Diagnosis not present

## 2023-02-28 DIAGNOSIS — D72819 Decreased white blood cell count, unspecified: Secondary | ICD-10-CM | POA: Diagnosis not present

## 2023-02-28 DIAGNOSIS — D696 Thrombocytopenia, unspecified: Secondary | ICD-10-CM | POA: Diagnosis not present

## 2023-02-28 DIAGNOSIS — R509 Fever, unspecified: Secondary | ICD-10-CM | POA: Diagnosis not present

## 2023-02-28 DIAGNOSIS — I2510 Atherosclerotic heart disease of native coronary artery without angina pectoris: Secondary | ICD-10-CM | POA: Diagnosis not present

## 2023-02-28 DIAGNOSIS — K760 Fatty (change of) liver, not elsewhere classified: Secondary | ICD-10-CM | POA: Diagnosis not present

## 2023-02-28 DIAGNOSIS — I67841 Reversible cerebrovascular vasoconstriction syndrome: Secondary | ICD-10-CM | POA: Diagnosis not present

## 2023-02-28 DIAGNOSIS — Z792 Long term (current) use of antibiotics: Secondary | ICD-10-CM | POA: Diagnosis not present

## 2023-02-28 DIAGNOSIS — Z7982 Long term (current) use of aspirin: Secondary | ICD-10-CM | POA: Diagnosis not present

## 2023-02-28 DIAGNOSIS — Z7902 Long term (current) use of antithrombotics/antiplatelets: Secondary | ICD-10-CM | POA: Diagnosis not present

## 2023-03-03 ENCOUNTER — Telehealth: Payer: Self-pay

## 2023-03-03 ENCOUNTER — Telehealth: Payer: Self-pay | Admitting: Internal Medicine

## 2023-03-03 NOTE — Telephone Encounter (Signed)
LVM

## 2023-03-03 NOTE — Telephone Encounter (Signed)
Transition Care Management Unsuccessful Follow-up Telephone Call  Date of discharge and from where:  03/03/23  Attempts:  1st Attempt  Reason for unsuccessful TCM follow-up call:  Left voice message

## 2023-03-03 NOTE — Telephone Encounter (Signed)
TOC d/c 04.24.2024

## 2023-03-04 ENCOUNTER — Telehealth: Payer: Self-pay

## 2023-03-04 NOTE — Telephone Encounter (Signed)
Toc complete

## 2023-03-04 NOTE — Telephone Encounter (Signed)
Transition Care Management Follow-up Telephone Call Date of discharge and from where: d/c 03/03/2023 from [facility] How have you been since you were released from the hospital? Still not completely better and sore Any questions or concerns? No  Items Reviewed: Did the pt receive and understand the discharge instructions provided? Yes  Medications obtained and verified? Yes  Other? No  Any new allergies since your discharge? No  Dietary orders reviewed? Yes Do you have support at home? Yes   Home Care and Equipment/Supplies: Were home health services ordered? not applicable If so, what is the name of the agency? N/A  Has the agency set up a time to come to the patient's home? not applicable Were any new equipment or medical supplies ordered?  No What is the name of the medical supply agency? N/A Were you able to get the supplies/equipment? not applicable Do you have any questions related to the use of the equipment or supplies? No  Functional Questionnaire: (I = Independent and D = Dependent) ADLs: I  Bathing/Dressing- I  Meal Prep- I  Eating- I  Maintaining continence- I  Transferring/Ambulation- I  Managing Meds- I  Follow up appointments reviewed:  PCP Hospital f/u appt confirmed? Yes  Scheduled to see Dr Barbaraann Faster on 03/08/23 @ 8:00am. Specialist Hospital f/u appt confirmed? No  Scheduled to see N/A on N/A @ N/A. Are transportation arrangements needed? No  If their condition worsens, is the pt aware to call PCP or go to the Emergency Dept.? Yes Was the patient provided with contact information for the PCP's office or ED? Yes Was to pt encouraged to call back with questions or concerns? Yes

## 2023-03-08 ENCOUNTER — Other Ambulatory Visit: Payer: Self-pay

## 2023-03-08 ENCOUNTER — Other Ambulatory Visit (HOSPITAL_COMMUNITY): Payer: Self-pay

## 2023-03-08 ENCOUNTER — Ambulatory Visit (HOSPITAL_COMMUNITY)
Admission: RE | Admit: 2023-03-08 | Discharge: 2023-03-08 | Disposition: A | Discharge status: Home / Self Care | Payer: 59 | Source: Ambulatory Visit | Attending: Internal Medicine | Admitting: Internal Medicine

## 2023-03-08 ENCOUNTER — Telehealth: Payer: Self-pay | Admitting: *Deleted

## 2023-03-08 ENCOUNTER — Encounter: Payer: Self-pay | Admitting: Internal Medicine

## 2023-03-08 ENCOUNTER — Telehealth: Payer: Self-pay | Admitting: Internal Medicine

## 2023-03-08 ENCOUNTER — Other Ambulatory Visit: Payer: Self-pay | Admitting: Internal Medicine

## 2023-03-08 ENCOUNTER — Ambulatory Visit (INDEPENDENT_AMBULATORY_CARE_PROVIDER_SITE_OTHER): Payer: 59 | Admitting: Internal Medicine

## 2023-03-08 VITALS — BP 108/69 | HR 74 | Resp 16 | Ht 69.0 in | Wt 191.0 lb

## 2023-03-08 DIAGNOSIS — M25552 Pain in left hip: Secondary | ICD-10-CM | POA: Diagnosis not present

## 2023-03-08 DIAGNOSIS — I639 Cerebral infarction, unspecified: Secondary | ICD-10-CM | POA: Diagnosis not present

## 2023-03-08 DIAGNOSIS — G43009 Migraine without aura, not intractable, without status migrainosus: Secondary | ICD-10-CM | POA: Diagnosis not present

## 2023-03-08 DIAGNOSIS — Z794 Long term (current) use of insulin: Secondary | ICD-10-CM

## 2023-03-08 DIAGNOSIS — J189 Pneumonia, unspecified organism: Secondary | ICD-10-CM

## 2023-03-08 DIAGNOSIS — G43119 Migraine with aura, intractable, without status migrainosus: Secondary | ICD-10-CM | POA: Diagnosis not present

## 2023-03-08 DIAGNOSIS — G459 Transient cerebral ischemic attack, unspecified: Secondary | ICD-10-CM | POA: Diagnosis not present

## 2023-03-08 DIAGNOSIS — Z09 Encounter for follow-up examination after completed treatment for conditions other than malignant neoplasm: Secondary | ICD-10-CM | POA: Diagnosis not present

## 2023-03-08 DIAGNOSIS — I25110 Atherosclerotic heart disease of native coronary artery with unstable angina pectoris: Secondary | ICD-10-CM | POA: Diagnosis not present

## 2023-03-08 DIAGNOSIS — Z0279 Encounter for issue of other medical certificate: Secondary | ICD-10-CM

## 2023-03-08 DIAGNOSIS — E1129 Type 2 diabetes mellitus with other diabetic kidney complication: Secondary | ICD-10-CM

## 2023-03-08 MED ORDER — TIRZEPATIDE 7.5 MG/0.5ML ~~LOC~~ SOAJ
7.5000 mg | SUBCUTANEOUS | 0 refills | Status: DC
  Filled 2023-03-08: qty 2, 28d supply, fill #0

## 2023-03-08 MED ORDER — TIRZEPATIDE 5 MG/0.5ML ~~LOC~~ SOAJ
5.0000 mg | SUBCUTANEOUS | 1 refills | Status: DC
Start: 2023-03-08 — End: 2023-05-24
  Filled 2023-03-08: qty 2, 28d supply, fill #0

## 2023-03-08 NOTE — Assessment & Plan Note (Addendum)
Patient presents with bilateral hip pain. Right hip is minimal, left hip is more painful.No aggravating event he is aware of, but was confused a few weeks ago before admission to hospital.  He usually has no problem with stretching and recently having pain with piriformis stretch.   Exam revealed weakness at left hip. Likely in setting of recent hospital stays. Will send for xray to rule out fracture. If patient does not have a fracture then I will place referral for physical therapy.

## 2023-03-08 NOTE — Telephone Encounter (Signed)
Refill sent to [facility]. Patient wife aware.

## 2023-03-08 NOTE — Telephone Encounter (Signed)
Called in regard to tirzepatide [facility]) 5 MG/0.5ML Pen [[id number]]    Med is on back order , wants a cll back with next steps

## 2023-03-08 NOTE — Patient Instructions (Addendum)
Thank you, [name] for allowing [place] to provide your care today.   I have ordered the following labs for you:   Lab Orders         CBC with Differential/Platelet         BMP8+EGFR          Reminders: I will follow up with lab results. I will see into resources available for a sitter.  The front desk is checking into a sleep study, follow up with pulmonologist who should have ordered.   Go to [place] to have hip xray Go by lab before you leave.     Thurmon Fair, M.D.

## 2023-03-08 NOTE — Telephone Encounter (Signed)
Patient came by desk and is checking on status of getting a sleep study done. Consult visit was 01/11/2023. Please call and advise patient [phone]

## 2023-03-08 NOTE — Progress Notes (Signed)
   HPI:[name] is a 67 y.o. male who presents for evaluation of Transitions Of Care ([facility] hosp d/c 4/24- pneumonia). For the details of today's visit, please refer to the assessment and plan.   Physical Exam: Vitals:   03/08/23 0808  BP: 108/69  Pulse: 74  Resp: 16  SpO2: 96%  Weight: 191 lb (86.6 kg)  Height: 5\' 9"  (1.753 m)     Physical Exam Constitutional:      General: He is not in acute distress.    Appearance: He is not ill-appearing.  Cardiovascular:     Rate and Rhythm: Normal rate and regular rhythm.     Heart sounds: No murmur heard. Pulmonary:     Effort: Pulmonary effort is normal. No respiratory distress.     Breath sounds: No wheezing or rales.  Musculoskeletal:     Comments: No deformity. FROM with 4/5 strength with left knee raise No tenderness to palpation. NVI distally. Negative logroll Negative faber, fadir. Soreness with left piriformis stretch   Skin:    General: Skin is warm.      Assessment & Plan:   Eliyohu was seen today for transitions of care.  Pneumonia of left lower lobe due to infectious organism Assessment & Plan: Patient presents for follow up after admission to [facility] 4/24 for pneumonia. He has Augmentin, end date 5/1. Symptoms have improved. He occasionally has a cough. No fever. He had low platelet count and mild metabolic acidosis on labs. Will check BMP and CBC.   Orders: -     CBC with Differential/Platelet -     BMP8+EGFR  Pain of left hip Assessment & Plan: Patient presents with bilateral hip pain. Right hip is minimal, left hip is more painful.No aggravating event he is aware of, but was confused a few weeks ago before admission to hospital.  He usually has no problem with stretching and recently having pain with piriformis stretch.   Exam revealed weakness at left hip. Likely in setting of recent hospital stays. Will send for xray to rule out fracture. If patient does not have a fracture then I will  place referral for physical therapy.   Orders: -     DG HIP UNILAT W OR W/O PELVIS 2-3 VIEWS LEFT      Milus Banister, MD

## 2023-03-08 NOTE — Assessment & Plan Note (Addendum)
Patient presents for follow up after admission to [facility] 4/24 for pneumonia. He has Augmentin, end date 5/1. Symptoms have improved. He occasionally has a cough. No fever. He had low platelet count and mild metabolic acidosis on labs. Medications reviewed with patient and wife. No changes made. They have continued statin and antihypertensives prescribed before hospital stay. Will check BMP and CBC.

## 2023-03-09 ENCOUNTER — Other Ambulatory Visit: Payer: Self-pay | Admitting: Internal Medicine

## 2023-03-09 ENCOUNTER — Telehealth: Payer: Self-pay | Admitting: Internal Medicine

## 2023-03-09 DIAGNOSIS — M25552 Pain in left hip: Secondary | ICD-10-CM

## 2023-03-09 LAB — CBC WITH DIFFERENTIAL/PLATELET

## 2023-03-09 LAB — BMP8+EGFR
Potassium: 3.9 mmol/L (ref 3.5–5.2)
Sodium: 140 mmol/L (ref 134–144)

## 2023-03-09 NOTE — Telephone Encounter (Signed)
HH/PT orders placed

## 2023-03-09 NOTE — Telephone Encounter (Signed)
Patient has been scheduled

## 2023-03-09 NOTE — Telephone Encounter (Signed)
Left message for patient

## 2023-03-10 ENCOUNTER — Encounter (HOSPITAL_COMMUNITY): Payer: Self-pay | Admitting: Hematology

## 2023-03-10 LAB — CBC WITH DIFFERENTIAL/PLATELET
Basophils Absolute: 0 10*3/uL (ref 0.0–0.2)
EOS (ABSOLUTE): 0.1 10*3/uL (ref 0.0–0.4)
Eos: 2 %
Hematocrit: 41.6 % (ref 37.5–51.0)
Immature Grans (Abs): 0 10*3/uL (ref 0.0–0.1)
Immature Granulocytes: 1 %
MCHC: 32.5 g/dL (ref 31.5–35.7)
MCV: 95 fL (ref 79–97)
Monocytes: 9 %
Platelets: 88 10*3/uL — CL (ref 150–450)
RBC: 4.4 x10E6/uL (ref 4.14–5.80)
RDW: 15 % (ref 11.6–15.4)
WBC: 3 10*3/uL — ABNORMAL LOW (ref 3.4–10.8)

## 2023-03-10 LAB — BMP8+EGFR
BUN/Creatinine Ratio: 11 (ref 10–24)
BUN: 8 mg/dL (ref 8–27)
CO2: 17 mmol/L — ABNORMAL LOW (ref 20–29)
Calcium: 9.2 mg/dL (ref 8.6–10.2)
Chloride: 106 mmol/L (ref 96–106)
Creatinine, Ser: 0.75 mg/dL — ABNORMAL LOW (ref 0.76–1.27)
Glucose: 91 mg/dL (ref 70–99)
eGFR: 101 mL/min/{1.73_m2} (ref >59)

## 2023-03-19 ENCOUNTER — Ambulatory Visit: Payer: 59

## 2023-03-19 DIAGNOSIS — R0683 Snoring: Secondary | ICD-10-CM

## 2023-03-19 DIAGNOSIS — G4733 Obstructive sleep apnea (adult) (pediatric): Secondary | ICD-10-CM

## 2023-03-25 DIAGNOSIS — G4733 Obstructive sleep apnea (adult) (pediatric): Secondary | ICD-10-CM | POA: Diagnosis not present

## 2023-03-26 ENCOUNTER — Telehealth: Payer: Self-pay | Admitting: Pulmonary Disease

## 2023-03-26 DIAGNOSIS — G4733 Obstructive sleep apnea (adult) (pediatric): Secondary | ICD-10-CM

## 2023-03-26 NOTE — Telephone Encounter (Signed)
HST showed severe OSA with AHI 37/ hr & low sat 87% Suggest CPAP titration study to see what pressure & whether he will tolerate better. Based on this, we can write Rx for PAP

## 2023-03-30 ENCOUNTER — Inpatient Hospital Stay: Payer: 59 | Attending: Hematology

## 2023-03-30 ENCOUNTER — Ambulatory Visit (HOSPITAL_COMMUNITY): Admission: RE | Admit: 2023-03-30 | Payer: 59 | Source: Ambulatory Visit

## 2023-03-30 ENCOUNTER — Ambulatory Visit (HOSPITAL_COMMUNITY): Payer: 59

## 2023-03-30 DIAGNOSIS — D72819 Decreased white blood cell count, unspecified: Secondary | ICD-10-CM | POA: Diagnosis not present

## 2023-03-30 DIAGNOSIS — K7460 Unspecified cirrhosis of liver: Secondary | ICD-10-CM | POA: Insufficient documentation

## 2023-03-30 DIAGNOSIS — Z8616 Personal history of COVID-19: Secondary | ICD-10-CM | POA: Insufficient documentation

## 2023-03-30 DIAGNOSIS — Z7901 Long term (current) use of anticoagulants: Secondary | ICD-10-CM | POA: Diagnosis not present

## 2023-03-30 DIAGNOSIS — D689 Coagulation defect, unspecified: Secondary | ICD-10-CM | POA: Diagnosis not present

## 2023-03-30 DIAGNOSIS — Z86711 Personal history of pulmonary embolism: Secondary | ICD-10-CM | POA: Diagnosis not present

## 2023-03-30 DIAGNOSIS — Z86718 Personal history of other venous thrombosis and embolism: Secondary | ICD-10-CM | POA: Insufficient documentation

## 2023-03-30 DIAGNOSIS — G9341 Metabolic encephalopathy: Secondary | ICD-10-CM | POA: Diagnosis not present

## 2023-03-30 DIAGNOSIS — R161 Splenomegaly, not elsewhere classified: Secondary | ICD-10-CM | POA: Insufficient documentation

## 2023-03-30 DIAGNOSIS — D696 Thrombocytopenia, unspecified: Secondary | ICD-10-CM | POA: Insufficient documentation

## 2023-03-30 DIAGNOSIS — I2699 Other pulmonary embolism without acute cor pulmonale: Secondary | ICD-10-CM

## 2023-03-30 LAB — CBC
HCT: 42.3 % (ref 39.0–52.0)
Hemoglobin: 14.9 g/dL (ref 13.0–17.0)
MCH: 31.8 pg (ref 26.0–34.0)
MCHC: 35.2 g/dL (ref 30.0–36.0)
MCV: 90.2 fL (ref 80.0–100.0)
Platelets: 74 10*3/uL — ABNORMAL LOW (ref 150–400)
RBC: 4.69 MIL/uL (ref 4.22–5.81)
RDW: 14.6 % (ref 11.5–15.5)
WBC: 2.7 10*3/uL — ABNORMAL LOW (ref 4.0–10.5)
nRBC: 0 % (ref 0.0–0.2)

## 2023-03-30 LAB — D-DIMER, QUANTITATIVE: D-Dimer, Quant: <0.27 ug/mL-FEU (ref 0.00–0.50)

## 2023-03-31 ENCOUNTER — Ambulatory Visit (HOSPITAL_COMMUNITY)
Admission: RE | Admit: 2023-03-31 | Discharge: 2023-03-31 | Disposition: A | Discharge status: Home / Self Care | Payer: 59 | Source: Ambulatory Visit | Attending: Hematology | Admitting: Hematology

## 2023-03-31 DIAGNOSIS — Z86718 Personal history of other venous thrombosis and embolism: Secondary | ICD-10-CM | POA: Diagnosis not present

## 2023-03-31 DIAGNOSIS — I2699 Other pulmonary embolism without acute cor pulmonale: Secondary | ICD-10-CM | POA: Diagnosis not present

## 2023-03-31 LAB — LUPUS ANTICOAGULANT PANEL
DRVVT: 54.2 s — ABNORMAL HIGH (ref 0.0–47.0)
PTT Lupus Anticoagulant: 42.7 s (ref 0.0–43.5)

## 2023-03-31 LAB — DRVVT MIX: dRVVT Mix: 42.5 s — ABNORMAL HIGH (ref 0.0–40.4)

## 2023-03-31 LAB — DRVVT CONFIRM: dRVVT Confirm: 0.9 ratio (ref 0.8–1.2)

## 2023-04-01 LAB — CARDIOLIPIN ANTIBODIES, IGG, IGM, IGA
Anticardiolipin IgA: <9 APL U/mL (ref 0–11)
Anticardiolipin IgG: <9 GPL U/mL (ref 0–14)
Anticardiolipin IgM: <9 MPL U/mL (ref 0–12)

## 2023-04-01 LAB — BETA-2-GLYCOPROTEIN I ABS, IGG/M/A
Beta-2 Glyco I IgG: <9 GPI IgG units (ref 0–20)
Beta-2-Glycoprotein I IgA: <9 GPI IgA units (ref 0–25)
Beta-2-Glycoprotein I IgM: <9 GPI IgM units (ref 0–32)

## 2023-04-02 NOTE — Telephone Encounter (Signed)
Called and spoke w/ pt he verbalized understanding of RA's message 0 order placed for CPAP titration @ [facility]. NFN at time of call

## 2023-04-07 ENCOUNTER — Encounter: Payer: Self-pay | Admitting: Internal Medicine

## 2023-04-07 ENCOUNTER — Ambulatory Visit (INDEPENDENT_AMBULATORY_CARE_PROVIDER_SITE_OTHER): Payer: 59 | Admitting: Internal Medicine

## 2023-04-07 VITALS — BP 102/61 | HR 76 | Ht 69.0 in | Wt 181.2 lb

## 2023-04-07 DIAGNOSIS — E1100 Type 2 diabetes mellitus with hyperosmolarity without nonketotic hyperglycemic-hyperosmolar coma (NKHHC): Secondary | ICD-10-CM

## 2023-04-07 DIAGNOSIS — I2699 Other pulmonary embolism without acute cor pulmonale: Secondary | ICD-10-CM

## 2023-04-07 DIAGNOSIS — G4733 Obstructive sleep apnea (adult) (pediatric): Secondary | ICD-10-CM | POA: Diagnosis not present

## 2023-04-07 DIAGNOSIS — E1165 Type 2 diabetes mellitus with hyperglycemia: Secondary | ICD-10-CM | POA: Diagnosis not present

## 2023-04-07 DIAGNOSIS — Z794 Long term (current) use of insulin: Secondary | ICD-10-CM | POA: Diagnosis not present

## 2023-04-07 NOTE — Patient Instructions (Signed)
It was a pleasure to see you today.  Thank you for giving [place] the opportunity to be involved in your care.  Below is a brief recap of your visit and next steps.  We will plan to see you again in 4 months.  Summary Stop metformin Continue mounjaro and farxiga Follow up in 4 months for repeat labs

## 2023-04-07 NOTE — Progress Notes (Signed)
Established Patient Office Visit  Subjective   Patient ID: [name] [name], male    DOB: 11/09/1956  Age: 67 y.o. MRN: [id number]  Chief Complaint  Patient presents with   Diabetes    Follow up   [name] [name] returns to care today for routine follow-up.  Last evaluated at [facility] on 4/29 for hospital follow-up in the setting of recent admission for community-acquired pneumonia.  Last evaluated by me on 3/11 for an acute visit after a fall.  He has also recently been seen by cardiology.  [name] [name] reports feeling well today.  He is asymptomatic and has no acute concerns to discuss.  Past Medical History:  Diagnosis Date   Anxiety    Aortic insufficiency    Arthritis    Asthma    Cataract    Right eye   Chronic lower back pain    Cirrhosis of liver (HCC)    Coronary atherosclerosis of native coronary artery    a. 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   LIMA to LAD, free RIMA to OM2, SVG to PDA, open SVG harvest from right thigh   Stroke (HCC)    Thrombocytopenia (HCC)    TIA (transient ischemic attack)    Type 2 diabetes mellitus (HCC)    Past Surgical History:  Procedure Laterality Date   ANTERIOR CERVICAL DECOMP/DISCECTOMY FUSION  1998   "C3-4"   CARDIAC CATHETERIZATION  "several"   CARDIAC CATHETERIZATION N/A 12/30/2015   Procedure: Left Heart Cath and Coronary Angiography;  Surgeon: Runell Gess, MD;  Location: [facility] INVASIVE CV LAB;  Service: Cardiovascular;  Laterality: N/A;   CARPAL TUNNEL RELEASE Bilateral 2005   CHOLECYSTECTOMY N/A 11/28/2018   Procedure: LAPAROSCOPIC CHOLECYSTECTOMY;  Surgeon:  Lucretia Roers, MD;  Location: [facility] ORS;  Service: General;  Laterality: N/A;   COLONOSCOPY     COLONOSCOPY WITH PROPOFOL N/A 09/27/2020   Procedure: COLONOSCOPY WITH PROPOFOL;  Surgeon: Dolores Frame, MD;  Location: [facility] ENDO SUITE;  Service: Gastroenterology;  Laterality: N/A;   CORONARY ANGIOPLASTY     CORONARY ANGIOPLASTY WITH STENT PLACEMENT  2002; 2003; 11/20/2014   "I have 4 stents after today" (11/20/2014)   CORONARY ARTERY BYPASS GRAFT N/A 01/09/2016   Procedure: CORONARY ARTERY BYPASS GRAFTING (CABG) X 3 UTILIZING RIGHT AND LEFT INTERNAL MAMMARY ARTERY AND ENDOSCOPICALLY HARVESTED SAPHENEOUS VEIN.;  Surgeon: Purcell Nails, MD;  Location: [facility] OR;  Service: Open Heart Surgery;  Laterality: N/A;   ESOPHAGOGASTRODUODENOSCOPY     ESOPHAGOGASTRODUODENOSCOPY (EGD) WITH PROPOFOL N/A 09/27/2020   Procedure: ESOPHAGOGASTRODUODENOSCOPY (EGD) WITH PROPOFOL;  Surgeon: Dolores Frame, MD;  Location: [facility] END[facility];  Service: Gastroenterology;  Laterality: N/A;  10   KNEE SURGERY Left 02/2012   "scraped; open"   LEFT HEART CATH AND CORS/GRAFTS ANGIOGRAPHY N/A 08/09/2017   Procedure: LEFT HEART CATH AND CORS/GRAFTS ANGIOGRAPHY;  Surgeon: Yvonne Kendall, MD;  Location: [facility] INVASIVE CV LAB;  Service: Cardiovascular;  Laterality: N/A;   LEFT HEART CATH AND CORS/GRAFTS ANGIOGRAPHY N/A 11/22/2018  Procedure: LEFT HEART CATH AND CORS/GRAFTS ANGIOGRAPHY;  Surgeon: Swaziland, [name], MD;  Location: [facility] INVASIVE CV LAB;  Service: Cardiovascular;  Laterality: N/A;   LEFT HEART CATH AND CORS/GRAFTS ANGIOGRAPHY N/A 08/20/2022   Procedure: LEFT HEART CATH AND CORS/GRAFTS ANGIOGRAPHY;  Surgeon: Kathleene Hazel, MD;  Location: [facility] INVASIVE CV LAB;  Service: Cardiovascular;  Laterality: N/A;   LEFT HEART CATHETERIZATION WITH CORONARY ANGIOGRAM N/A 07/20/2012   Procedure: LEFT HEART CATHETERIZATION WITH CORONARY ANGIOGRAM;  Surgeon: Iran Ouch, MD;  Location: [facility] CATH LAB;  Service:  Cardiovascular;  Laterality: N/A;   LEFT HEART CATHETERIZATION WITH CORONARY ANGIOGRAM N/A 11/20/2014   Procedure: LEFT HEART CATHETERIZATION WITH CORONARY ANGIOGRAM;  Surgeon: [name] Swaziland, MD;  Location: [facility] CATH LAB;  Service: Cardiovascular;  Laterality: N/A;   LEFT HEART CATHETERIZATION WITH CORONARY ANGIOGRAM N/A 11/26/2014   Procedure: LEFT HEART CATHETERIZATION WITH CORONARY ANGIOGRAM;  Surgeon: [name] Swaziland, MD;  Location: [facility] CATH LAB;  Service: Cardiovascular;  Laterality: N/A;   NEUROPLASTY / TRANSPOSITION ULNAR NERVE AT ELBOW Right ~ 2012   PERCUTANEOUS CORONARY ROTOBLATOR INTERVENTION (PCI-R)  11/20/2014   Procedure: PERCUTANEOUS CORONARY ROTOBLATOR INTERVENTION (PCI-R);  Surgeon: [name] [place]aziland, MD;  Location: [facility] CATH LAB;  Service: Cardiovascular;;   POSTERIOR CERVICAL LAMINECTOMY Left 04/16/2022   Procedure: Laminectomy and Foraminotomy - left - C6-C7;  Surgeon: Julio Sicks, MD;  Location: [facility] OR;  Service: Neurosurgery;  Laterality: Left;  3C   SHOULDER ARTHROSCOPY Left ~ 2011   TEE WITHOUT CARDIOVERSION N/A 01/09/2016   Procedure: TRANSESOPHAGEAL ECHOCARDIOGRAM (TEE);  Surgeon: Purcell Nails, MD;  Location: [facility] OR;  Service: Open Heart Surgery;  Laterality: N/A;   Social History   Tobacco Use   Smoking status: Never   Smokeless tobacco: Never  Vaping Use   Vaping Use: Never used  Substance Use Topics   Alcohol use: No   Drug use: No   Family History  Problem Relation Age of Onset   Stroke Mother    Coronary artery disease Father 33   Asthma Sister    Multiple sclerosis Brother    Heart disease Paternal Uncle    Stroke Maternal Grandmother    Heart attack Paternal Grandmother    Cancer Paternal Grandfather    Asthma Sister    Seizures Son    Migraines Son    Autism Son    Migraines Son    Allergies  Allergen Reactions   Divalproex Sodium Other (See Comments)    Causes anger   Statins Other (See Comments)    Muscle aches and cramps   Tramadol Other (See  Comments)    Chest pain    Valproic Acid Other (See Comments)    Causes anger   Gadolinium Derivatives Nausea And Vomiting    07/25/19 Pt vomited immediately after IV gad. Denies itching, dyspnea.  (Adverse, not allergic, reaction   Tricor [Fenofibrate] Other (See Comments)    Leg cramps   Review of Systems  Constitutional:  Negative for chills and fever.  HENT:  Negative for sore throat.   Respiratory:  Negative for cough and shortness of breath.   Cardiovascular:  Negative for chest pain, palpitations and leg swelling.  Gastrointestinal:  Negative for abdominal pain, blood in stool, constipation, diarrhea, nausea and vomiting.  Genitourinary:  Negative for dysuria and hematuria.  Musculoskeletal:  Negative for myalgias.  Skin:  Negative for itching and rash.  Neurological:  Negative for dizziness and headaches.  Psychiatric/Behavioral:  Negative for depression and suicidal  ideas.      Objective:     BP 102/61   Pulse 76   Ht 5\' 9"  (1.753 m)   Wt 181 lb 3.2 oz (82.2 kg)   SpO2 98%   BMI 26.76 kg/m  BP Readings from Last 3 Encounters:  04/08/23 108/68  04/07/23 102/61  03/08/23 108/69   Physical Exam Vitals reviewed.  Constitutional:      General: He is not in acute distress.    Appearance: Normal appearance. He is not ill-appearing.  HENT:     Head: Normocephalic and atraumatic.     Right Ear: External ear normal.     Left Ear: External ear normal.     Nose: Nose normal. No congestion or rhinorrhea.     Mouth/Throat:     Mouth: Mucous membranes are moist.     Pharynx: Oropharynx is clear.  Eyes:     General: No scleral icterus.    Extraocular Movements: Extraocular movements intact.     Conjunctiva/sclera: Conjunctivae normal.     Pupils: Pupils are equal, round, and reactive to light.  Cardiovascular:     Rate and Rhythm: Normal rate and regular rhythm.     Pulses: Normal pulses.     Heart sounds: Murmur heard.  Pulmonary:     Effort: Pulmonary effort is  normal.     Breath sounds: Normal breath sounds. No wheezing, rhonchi or rales.  Abdominal:     General: Abdomen is flat. Bowel sounds are normal. There is no distension.     Palpations: Abdomen is soft.     Tenderness: There is no abdominal tenderness.  Musculoskeletal:        General: No swelling or deformity. Normal range of motion.     Cervical back: Normal range of motion.  Skin:    General: Skin is warm and dry.     Capillary Refill: Capillary refill takes less than 2 seconds.  Neurological:     General: No focal deficit present.     Mental Status: He is alert and oriented to person, place, and time.     Motor: No weakness.  Psychiatric:        Mood and Affect: Mood normal.        Behavior: Behavior normal.        Thought Content: Thought content normal.   Last CBC Lab Results  Component Value Date   WBC 2.7 (L) 03/30/2023   HGB 14.9 03/30/2023   HCT 42.3 03/30/2023   MCV 90.2 03/30/2023   MCH 31.8 03/30/2023   RDW 14.6 03/30/2023   PLT 74 (L) 03/30/2023   Last metabolic panel Lab Results  Component Value Date   GLUCOSE 91 03/08/2023   NA 140 03/08/2023   K 3.9 03/08/2023   CL 106 03/08/2023   CO2 17 (L) 03/08/2023   BUN 8 03/08/2023   CREATININE 0.75 (L) 03/08/2023   EGFR 101 03/08/2023   CALCIUM 9.2 03/08/2023   PROT 7.5 01/27/2023   ALBUMIN 3.3 (L) 01/30/2023   LABGLOB 3.5 09/29/2022   AGRATIO 1.1 (L) 09/29/2022   BILITOT 1.3 (H) 01/27/2023   ALKPHOS 52 01/27/2023   AST 33 01/27/2023   ALT 26 01/27/2023   ANIONGAP 9 02/02/2023   Last lipids Lab Results  Component Value Date   CHOL 87 02/02/2023   HDL 32 (L) 02/02/2023   LDLCALC 36 02/02/2023   TRIG 96 02/02/2023   CHOLHDL 2.7 02/02/2023   Last hemoglobin A1c Lab Results  Component Value Date  HGBA1C 5.9 (H) 01/27/2023   Last thyroid functions Lab Results  Component Value Date   TSH 1.077 01/27/2023   Last vitamin D Lab Results  Component Value Date   VD25OH 25.8 (L) 09/29/2022    Last vitamin B12 and Folate Lab Results  Component Value Date   VITAMINB12 326 01/27/2023   FOLATE 8.3 01/27/2023     Assessment & Plan:   Problem List Items Addressed This Visit       Pulmonary embolism (HCC)    History of DVT/PE, 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  He remains on Eliquis 5 mg twice daily.  Follow-up with hematology as scheduled for tomorrow to determine appropriate duration of anticoagulation.      Obstructive sleep apnea    Followed by sleep clinic (Dr. Vassie Loll).  Severe OSA with oxygen desaturations noted on HST completed earlier this month.  PAP titration is pending.      Type 2 diabetes mellitus (HCC) - Primary    A1c 5.9 in March.  He is currently prescribed [name] 5 mg weekly, metformin 1000 mg twice daily, and Farxiga 5 mg daily.  He is interested in discontinuing metformin given that his diabetes is well-controlled. -Through shared decision making, metformin has been discontinued.  Can further increase [name] if necessary proved glycemic control.  We will tentatively plan for follow-up in 4 months and repeat labs at that time. -Ophthalmology referral placed today for diabetic eye exam       Return in about 4 months (around 08/08/2023).    [name], MD

## 2023-04-07 NOTE — Progress Notes (Signed)
[facility] [address], [place] [address]    Clinic Day:  04/08/2023  Referring physician: [name], MD  Patient Care Team: [name], MD as PCP - General (Internal Medicine) Jonelle Sidle, MD as PCP - Cardiology (Cardiology) Doreatha Massed, MD as Medical Oncologist (Hematology)   ASSESSMENT & PLAN:   Assessment: 1.  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 - Presented to the ER on 12/09/2022 with intermittent midsternal pain radiating to the arm.  Chest x-ray was normal. - Was diagnosed with COVID with home test positive around 12/21/2022 (symptoms fever 1 day and cough still lingering) - Presented to ER again on 12/22/2022 with same symptoms. - Third presentation to the ER on 12/26/2022 with same symptoms. - CT angio chest: Small segmental and subsegmental pulmonary embolus in the right middle lobe with no evidence of right heart strain.  Scattered groundglass opacities in the right lung and at the left lung base, likely infectious or inflammatory.  Hepatic cirrhosis and splenomegaly. - Ultrasound legs (12/27/2022): Left profundofemoral vein DVT.  No evidence of DVT in the right leg. - Chest pain completely resolved after Eliquis started. - 20 pound weight loss since on [name].  No fevers or night sweats. - Initially it was thought to be thrombosis related to COVID.  However patient had presentation to the ER with similar symptoms of midsternal pain radiating to the arm even prior to COVID infection on 12/09/2022.  Because of his sedentary job, it was classified as weakly provoked to unprovoked.   2.  Social/family history: - Lives at home with his wife.  He has been on disability for 19 years and recently started working as a Electrical engineer at [employer] for the last 1 year.  He is non-smoker.   Nonalcoholic. - No family history of thrombosis.  Mother had 1 miscarriage. - Paternal grandfather had cancer.    Plan: 1.  Unprovoked DVT/PE: - We reviewed results of lupus anticoagulant, anticardiolipin antibodies and antibeta 2 glycoprotein antibodies negative.  D-dimer was undetectable. - Reviewed left leg Doppler which was negative for DVT with resolution of the clot. - He had recent hospitalization with confusion thought to be from metabolic and cephalopathy and RCVS. - He is continuing to lose weight (total of 40 pounds) on [name]. - I have recommended continuing anticoagulation with Eliquis. - Because of coexisting thrombocytopenia, I will reevaluate him in 4 months to assess risk-benefit ratio.   2.  Mild leukopenia and moderate thrombocytopenia: - He has evidence of cirrhosis with splenomegaly on CT scan. - Most likely etiology of mild leukopenia and moderate thrombocytopenia likely from splenomegaly. - Will repeat CBC at next visit with B12, folic acid and MMA.    Orders Placed This Encounter  Procedures   CBC with Differential    Standing Status:   Future    Standing Expiration Date:   04/07/2024   Vitamin B12    Standing Status:   Future    Standing Expiration Date:   04/07/2024   Methylmalonic acid, serum    Standing Status:   Future    Standing Expiration Date:   04/07/2024   Folate    Standing Status:   Future    Standing Expiration Date:   04/07/2024      I,Katie Daubenspeck,acting as a scribe for [employer],  MD.,have documented all relevant documentation on the behalf of Doreatha Massed, MD,as directed by  Doreatha Massed, MD while in the presence of Doreatha Massed, MD.   I, Doreatha Massed MD, have reviewed the above documentation for accuracy and completeness, and I agree with the above.   Doreatha Massed, MD   5/30/20246:24 PM  CHIEF COMPLAINT:   Diagnosis: DVT/PE    Cancer Staging  No matching staging information  was found for the patient.   Prior Therapy: none  Current Therapy:  Eliquis   HISTORY OF PRESENT ILLNESS:   Oncology History   No history exists.     INTERVAL HISTORY:   Kazimer is a 67 y.o. male presenting to clinic today for follow up of DVT/PE. He was last seen by me on 01/07/23.  Since his last visit, he underwent repeat LLE doppler on 03/31/23 showing no evidence of DVT.  Of note, he was also seen in the ED on 01/17/23 following a fall, admitted from 01/27/23 through 02/02/23 for metabolic encephalopathy and suggestive RCVS, and admitted 02/28/23 through 03/03/23 for pneumonia.  Today, he states that he is doing well overall. His appetite level is at 100%. His energy level is at 70%.  PAST MEDICAL HISTORY:   Past Medical History: Past Medical History:  Diagnosis Date   Anxiety    Aortic insufficiency    Arthritis    Asthma    Cataract    Right eye   Chronic lower back pain    Cirrhosis of liver (HCC)    Coronary atherosclerosis of native coronary artery    a. 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   LIMA to LAD, free RIMA to OM2, SVG to PDA, open SVG harvest from right thigh   Stroke (HCC)    Thrombocytopenia (HCC)    TIA (transient ischemic attack)    Type 2 diabetes mellitus (HCC)     Surgical History: Past Surgical History:  Procedure Laterality Date   ANTERIOR CERVICAL DECOMP/DISCECTOMY FUSION  1998   "C3-4"   CARDIAC CATHETERIZATION  "several"   CARDIAC CATHETERIZATION N/A 12/30/2015   Procedure: Left Heart Cath and Coronary Angiography;  Surgeon: Runell Gess, MD;  Location: [facility]  INVASIVE CV LAB;  Service: Cardiovascular;  Laterality: N/A;   CARPAL TUNNEL RELEASE Bilateral 2005   CHOLECYSTECTOMY N/A 11/28/2018   Procedure: LAPAROSCOPIC CHOLECYSTECTOMY;  Surgeon: Lucretia Roers, MD;  Location: [facility] ORS;  Service: General;  Laterality: N/A;   COLONOSCOPY     COLONOSCOPY WITH PROPOFOL N/A 09/27/2020   Procedure: COLONOSCOPY WITH PROPOFOL;  Surgeon: Dolores Frame, MD;  Location: [facility] ENDO SUITE;  Service: Gastroenterology;  Laterality: N/A;   CORONARY ANGIOPLASTY     CORONARY ANGIOPLASTY WITH STENT PLACEMENT  2002; 2003; 11/20/2014   "I have 4 stents after today" (11/20/2014)   CORONARY ARTERY BYPASS GRAFT N/A 01/09/2016   Procedure: CORONARY ARTERY BYPASS GRAFTING (CABG) X 3 UTILIZING RIGHT AND LEFT INTERNAL MAMMARY ARTERY AND ENDOSCOPICALLY HARVESTED SAPHENEOUS VEIN.;  Surgeon: Purcell Nails, MD;  Location: [facility] OR;  Service: Open Heart Surgery;  Laterality: N/A;   ESOPHAGOGASTRODUODENOSCOPY     ESOPHAGOGASTRODUODENOSCOPY (EGD) WITH PROPOFOL N/A 09/27/2020   Procedure: ESOPHAGOGASTRODUODENOSCOPY (EGD) WITH PROPOFOL;  Surgeon: Dolores Frame, MD;  Location: [facility] END[facility] SU[facility];  Service: Gastroenterology;  Laterality: N/A;  10   KNEE SURGERY Left 02/2012   "scraped; open"   LEFT HEART CATH AND CORS/GRAFTS ANGIOGRAPHY N/A 08/09/2017   Procedure: LEFT HEART CATH AND CORS/GRAFTS ANGIOGRAPHY;  Surgeon: Yvonne Kendall, MD;  [facility]: [facility] INVASIVE CV LAB;  Service: Cardiovascular;  Laterality: N/A;   LEFT HEART CATH AND CORS/GRAFTS ANGIOGRAPHY N/A 11/22/2018   Procedure: LEFT HEART CATH AND CORS/GRAFTS ANGIOGRAPHY;  Surgeon: Swaziland, [name], MD;  Location: [facility] INVASIVE CV LAB;  Service: Cardiovascular;  Laterality: N/A;   LEFT HEART CATH AND CORS/GRAFTS ANGIOGRAPHY N/A 08/20/2022   Procedure: LEFT HEART CATH AND CORS/GRAFTS ANGIOGRAPHY;  Surgeon: Kathleene Hazel, MD;  [facility]: [facility] INVASIVE CV LAB;  Service: Cardiovascular;  Laterality: N/A;   LEFT HEART  CATHETERIZATION WITH CORONARY ANGIOGRAM N/A 07/20/2012   Procedure: LEFT HEART CATHETERIZATION WITH CORONARY ANGIOGRAM;  Surgeon: Iran Ouch, MD;  Location: [facility] CATH LAB;  Service: Cardiovascular;  Laterality: N/A;   LEFT HEART CATHETERIZATION WITH CORONARY ANGIOGRAM N/A 11/20/2014   Procedure: LEFT HEART CATHETERIZATION WITH CORONARY ANGIOGRAM;  Surgeon: [name] Swaziland, MD;  Location: [facility] CATH LAB;  Service: Cardiovascular;  Laterality: N/A;   LEFT HEART CATHETERIZATION WITH CORONARY ANGIOGRAM N/A 11/26/2014   Procedure: LEFT HEART CATHETERIZATION WITH CORONARY ANGIOGRAM;  Surgeon: [name] Swaziland, MD;  Location: [facility] CATH LAB;  Service: Cardiovascular;  Laterality: N/A;   NEUROPLASTY / TRANSPOSITION ULNAR NERVE AT ELBOW Right ~ 2012   PERCUTANEOUS CORONARY ROTOBLATOR INTERVENTION (PCI-R)  11/20/2014   Procedure: PERCUTANEOUS CORONARY ROTOBLATOR INTERVENTION (PCI-R);  Surgeon: [name] Swaziland, MD;  Location: [facility] CATH LAB;  Service: Cardiovascular;;   POSTERIOR CERVICAL LAMINECTOMY Left 04/16/2022   Procedure: Laminectomy and Foraminotomy - left - C6-C7;  Surgeon: Julio Sicks, MD;  Location: [facility] OR;  Service: Neurosurgery;  Laterality: Left;  3C   SHOULDER ARTHROSCOPY Left ~ 2011   TEE WITHOUT CARDIOVERSION N/A 01/09/2016   Procedure: TRANSESOPHAGEAL ECHOCARDIOGRAM (TEE);  Surgeon: Purcell Nails, MD;  Location: [facility] OR;  Service: Open Heart Surgery;  Laterality: N/A;    Social History: Social History   Socioeconomic History   Marital status: Married    Spouse name: [name]   Number of children: 3   Years of education: 12 th    Highest education level: Not on file  Occupational History   Occupation: Unemployed  Tobacco Use   Smoking status: Never   Smokeless tobacco: Never  Vaping Use   Vaping Use: Never used  Substance and Sexual Activity   Alcohol use: No   Drug use: No   Sexual activity: Not on file  Other Topics Concern   Not on file  Social History Narrative   Patient lives at home  with wife [name].   Patient works at [employer], Social research officer, government.   Patient has a 12 th grade education.    Patient has 3 children.       Social Determinants of Health   Financial Resource Strain: Low Risk  (02/03/2023)   Overall Financial Resource Strain (CARDIA)    Difficulty of Paying Living Expenses: Not hard at all  Food Insecurity: No Food Insecurity (02/03/2023)   Hunger Vital Sign    Worried About Running Out of Food in the Last  Year: Never true    Ran Out of Food in the Last Year: Never true  Transportation Needs: No Transportation Needs (02/03/2023)   PRAPARE - Administrator, Civil Service (Medical): No    Lack of Transportation (Non-Medical): No  Physical Activity: Sufficiently Active (11/23/2022)   Exercise Vital Sign    Days of Exercise per Week: 7 days    Minutes of Exercise per Session: 30 min  Stress: No Stress Concern Present (11/23/2022)   [employer] of Occupational Health - Occupational Stress Questionnaire    Feeling of Stress : Not at all  Social Connections: Socially Integrated (02/25/2021)   Social Connection and Isolation Panel [NHANES]    Frequency of Communication with Friends and Family: More than three times a week    Frequency of Social Gatherings with Friends and Family: More than three times a week    Attends Religious Services: More than 4 times per year    Active Member of [employer] or Organizations: Yes    Attends Engineer, structural: More than 4 times per year    Marital Status: Married  Catering manager Violence: Not At Risk (01/07/2023)   Humiliation, Afraid, Rape, and Kick questionnaire    Fear of Current or Ex-Partner: No    Emotionally Abused: No    Physically Abused: No    Sexually Abused: No    Family History: Family History  Problem Relation Age of Onset   Stroke Mother    Coronary artery disease Father 56   Asthma Sister    Multiple sclerosis Brother    Heart disease Paternal Uncle    Stroke  Maternal Grandmother    Heart attack Paternal Grandmother    Cancer Paternal Grandfather    Asthma Sister    Seizures Son    Migraines Son    Autism Son    Migraines Son     Current Medications:  Current Outpatient Medications:    acetaminophen (TYLENOL) 500 MG tablet, Take 1 tablet (500 mg total) by mouth every 8 (eight) hours as needed for moderate pain or headache., Disp: 20 tablet, Rfl: 0   albuterol (VENTOLIN HFA) 108 (90 Base) MCG/ACT inhaler, Inhale 2 puffs into the lungs every 6 (six) hours as needed for wheezing or shortness of breath., Disp: , Rfl:    apixaban (ELIQUIS) 5 MG TABS tablet, 2 po bid thru 2/25, then 1 po BID afterwards, Disp: 60 tablet, Rfl: 2   carvedilol (COREG) 3.125 MG tablet, Take 1 tablet (3.125 mg total) by mouth 2 (two) times daily., Disp: 60 tablet, Rfl: 3   citalopram (CELEXA) 20 MG tablet, Take 1 tablet (20 mg total) by mouth at bedtime., Disp: 90 tablet, Rfl: 0   Continuous Blood Gluc Receiver (FREESTYLE LIBRE 2 READER) DEVI, 1 Device by Does not apply route continuous., Disp: 1 each, Rfl: 0   Continuous Blood Gluc Sensor (FREESTYLE LIBRE 2 SENSOR) MISC, Apply one sensor every 14 days, Disp: 6 each, Rfl: 3   cyclobenzaprine (FLEXERIL) 10 MG tablet, TAKE 1 TABLET BY MOUTH THREE TIMES DAILY AS NEEDED FOR MUSCLE SPASMS, Disp: 30 tablet, Rfl: 10   dapagliflozin propanediol (FARXIGA) 5 MG TABS tablet, TAKE 1 TABLET BY MOUTH ONCE DAILY BEFORE BREAKFAST, Disp: 90 tablet, Rfl: 0   ezetimibe (ZETIA) 10 MG tablet, Take 1 tablet (10 mg total) by mouth daily., Disp: 90 tablet, Rfl: 0   fluticasone (FLONASE) 50 MCG/ACT nasal spray, Place 2 sprays into both nostrils 2 (two) times daily., Disp: 15.8  mL, Rfl: 3   Fluticasone-Salmeterol (ADVAIR) 250-50 MCG/DOSE AEPB, Inhale 1 puff into the lungs every 12 (twelve) hours., Disp: , Rfl:    gabapentin (NEURONTIN) 300 MG capsule, Take 1 capsule (300 mg total) by mouth 3 (three) times daily. Take 300 mg by mouth 3 (three) times  daily., Disp: 270 capsule, Rfl: 0   isosorbide mononitrate (IMDUR) 60 MG 24 hr tablet, Take one (1) tablet by mouth ( 60 mg ) in the am and one half tablet ( 30 mg) in the pm., Disp: 90 tablet, Rfl: 1   Melatonin 10 MG TABS, Take 10 mg by mouth at bedtime., Disp: , Rfl:    metFORMIN (GLUCOPHAGE) 1000 MG tablet, Take 1 tablet (1,000 mg total) by mouth 2 (two) times daily., Disp: 60 tablet, Rfl: 11   nitroGLYCERIN (NITROSTAT) 0.4 MG SL tablet, Place 1 tablet (0.4 mg total) under the tongue every 5 (five) minutes x 3 doses as needed for chest pain (if no relief after 2nd dose, proceed to the ED for an evaluation or call 911). Place 0.4 mg under the tongue every 5 (five) minutes x 3 doses as needed for chest pain (if no relief after 2nd dose, proceed to the ED for an evaluation or call 911)., Disp: 30 tablet, Rfl: 0   omeprazole (PRILOSEC) 40 MG capsule, Take 1 capsule (40 mg total) by mouth daily., Disp: 30 capsule, Rfl: 3   polycarbophil (FIBERCON) 625 MG tablet, Take 625 mg by mouth daily., Disp: , Rfl:    rosuvastatin (CRESTOR) 20 MG tablet, Take 1 tablet (20 mg total) by mouth daily., Disp: 30 tablet, Rfl: 2   tirzepatide ([name]) 5 MG/0.5ML Pen, Inject 5 mg into the skin once a week., Disp: 6 mL, Rfl: 1   tirzepatide ([name]) 7.5 MG/0.5ML Pen, Inject 7.5 mg into the skin once a week., Disp: 6 mL, Rfl: 0   zonisamide (ZONEGRAN) 100 MG capsule, Take 100 mg by mouth daily., Disp: , Rfl:    Allergies: Allergies  Allergen Reactions   Divalproex Sodium Other (See Comments)    Causes anger   Statins Other (See Comments)    Muscle aches and cramps   Tramadol Other (See Comments)    Chest pain    Valproic Acid Other (See Comments)    Causes anger   Gadolinium Derivatives Nausea And Vomiting    07/25/19 Pt vomited immediately after IV gad. Denies itching, dyspnea.  (Adverse, not allergic, reaction   Tricor [Fenofibrate] Other (See Comments)    Leg cramps    REVIEW OF SYSTEMS:   Review of  Systems  Constitutional:  Negative for chills, fatigue and fever.  HENT:   Negative for lump/mass, mouth sores, nosebleeds, sore throat and trouble swallowing.   Eyes:  Negative for eye problems.  Respiratory:  Negative for cough and shortness of breath.   Cardiovascular:  Negative for chest pain, leg swelling and palpitations.  Gastrointestinal:  Negative for abdominal pain, constipation, diarrhea, nausea and vomiting.  Genitourinary:  Negative for bladder incontinence, difficulty urinating, dysuria, frequency, hematuria and nocturia.   Musculoskeletal:  Negative for arthralgias, back pain, flank pain, myalgias and neck pain.  Skin:  Negative for itching and rash.  Neurological:  Positive for headaches. Negative for dizziness and numbness.  Hematological:  Does not bruise/bleed easily.  Psychiatric/Behavioral:  Negative for depression, sleep disturbance and suicidal ideas. The patient is not nervous/anxious.   All other systems reviewed and are negative.    VITALS:   Blood pressure 108/68, pulse  75, temperature 98.2 F (36.8 C), temperature source Oral, resp. rate 18, weight 181 lb (82.1 kg), SpO2 100 %.  Wt Readings from Last 3 Encounters:  04/08/23 181 lb (82.1 kg)  04/07/23 181 lb 3.2 oz (82.2 kg)  03/08/23 191 lb (86.6 kg)    Body mass index is 26.73 kg/m.  Performance status (ECOG): 1 - Symptomatic but completely ambulatory  PHYSICAL EXAM:   Physical Exam Vitals and nursing note reviewed. Exam conducted with a chaperone present.  Constitutional:      Appearance: Normal appearance.  Cardiovascular:     Rate and Rhythm: Normal rate and regular rhythm.     Pulses: Normal pulses.     Heart sounds: Normal heart sounds.  Pulmonary:     Effort: Pulmonary effort is normal.     Breath sounds: Normal breath sounds.  Abdominal:     Palpations: Abdomen is soft. There is no hepatomegaly, splenomegaly or mass.     Tenderness: There is no abdominal tenderness.  Musculoskeletal:      Right lower leg: No edema.     Left lower leg: No edema.  Lymphadenopathy:     Cervical: No cervical adenopathy.     Right cervical: No superficial, deep or posterior cervical adenopathy.    Left cervical: No superficial, deep or posterior cervical adenopathy.     Upper Body:     Right upper body: No supraclavicular or axillary adenopathy.     Left upper body: No supraclavicular or axillary adenopathy.  Neurological:     General: No focal deficit present.     Mental Status: He is alert and oriented to person, place, and time.  Psychiatric:        Mood and Affect: Mood normal.        Behavior: Behavior normal.     LABS:      Latest Ref Rng & Units 03/30/2023   12:18 PM 03/08/2023    9:01 AM 02/09/2023    9:58 AM  CBC  WBC 4.0 - 10.5 K/uL 2.7  3.0  3.0   Hemoglobin 13.0 - 17.0 g/dL 16.1  09.6  04.5   Hematocrit 39.0 - 52.0 % 42.3  41.6  41.3   Platelets 150 - 400 K/uL 74  88  62       Latest Ref Rng & Units 03/08/2023    9:01 AM 02/02/2023    5:55 AM 02/01/2023    6:53 AM  CMP  Glucose 70 - 99 mg/dL 91  409  91   BUN 8 - 27 mg/dL 8  11  9    Creatinine 0.76 - 1.27 mg/dL 8.11  9.14  7.82   Sodium 134 - 144 mmol/L 140  135  140   Potassium 3.5 - 5.2 mmol/L 3.9  3.6  3.5   Chloride 96 - 106 mmol/L 106  108  111   CO2 20 - 29 mmol/L 17  18  22    Calcium 8.6 - 10.2 mg/dL 9.2  8.7  8.5      No results found for: "CEA1", "CEA" / No results found for: "CEA1", "CEA" No results found for: "PSA1" No results found for: "NFA213" No results found for: "CAN125"  Lab Results  Component Value Date   TOTALPROTELP 7.8 12/26/2018   ALBUMINELP 3.1 12/26/2018   A1GS 0.3 12/26/2018   A2GS 1.0 12/26/2018   BETS 1.6 (H) 12/26/2018   GAMS 1.8 12/26/2018   MSPIKE Not Observed 12/26/2018   SPEI Comment 12/26/2018   Lab  Results  Component Value Date   TIBC 388 08/20/2021   TIBC 402 02/18/2021   TIBC 374 05/31/2020   FERRITIN 27 08/20/2021   FERRITIN 36 02/18/2021   FERRITIN 100  05/31/2020   IRONPCTSAT 29 08/20/2021   IRONPCTSAT 25 02/18/2021   IRONPCTSAT 27 05/31/2020   Lab Results  Component Value Date   LDH 157 02/18/2021   LDH 138 08/30/2019     STUDIES:   US Venous Img Lower Unilateral Left  Result Date: 03/31/2023 CLINICAL DATA:  hx DVT LLE EXAM: LEFT LOWER EXTREMITY VENOUS DOPPLER ULTRASOUND TECHNIQUE: Gray-scale sonography with compression, as well as color and duplex ultrasound, were performed to evaluate the deep venous system(s) from the level of the common femoral vein through the popliteal and proximal calf veins. COMPARISON:  Pelvis and LEFT hip XRs, 03/08/2023. FINDINGS: Suboptimal evaluation, with poor acoustic penetration secondary to patient habitus. VENOUS Normal compressibility of the common femoral, superficial femoral, and popliteal veins, as well as the visualized calf veins. Visualized portions of profunda femoral vein and great saphenous vein unremarkable. No filling defects to suggest DVT on grayscale or color Doppler imaging. Doppler waveforms show normal direction of venous flow, normal respiratory plasticity and response to augmentation. Limited views of the contralateral common femoral vein are unremarkable. OTHER No evidence of superficial thrombophlebitis or abnormal fluid collection. Limitations: Patient body habitus IMPRESSION: No evidence of femoropopliteal DVT or superficial thrombophlebitis within the LEFT lower extremity. Roanna Banning, MD [facility] Radiology Electronically Signed   By: Roanna Banning M.D.   On: 03/31/2023 12:21

## 2023-04-08 ENCOUNTER — Inpatient Hospital Stay (HOSPITAL_BASED_OUTPATIENT_CLINIC_OR_DEPARTMENT_OTHER): Payer: 59 | Admitting: Hematology

## 2023-04-08 VITALS — BP 108/68 | HR 75 | Temp 98.2°F | Resp 18 | Wt 181.0 lb

## 2023-04-08 DIAGNOSIS — G9341 Metabolic encephalopathy: Secondary | ICD-10-CM | POA: Diagnosis not present

## 2023-04-08 DIAGNOSIS — D72819 Decreased white blood cell count, unspecified: Secondary | ICD-10-CM | POA: Diagnosis not present

## 2023-04-08 DIAGNOSIS — Z8616 Personal history of COVID-19: Secondary | ICD-10-CM | POA: Diagnosis not present

## 2023-04-08 DIAGNOSIS — Z86718 Personal history of other venous thrombosis and embolism: Secondary | ICD-10-CM | POA: Diagnosis not present

## 2023-04-08 DIAGNOSIS — K7460 Unspecified cirrhosis of liver: Secondary | ICD-10-CM | POA: Diagnosis not present

## 2023-04-08 DIAGNOSIS — E611 Iron deficiency: Secondary | ICD-10-CM | POA: Diagnosis not present

## 2023-04-08 DIAGNOSIS — D689 Coagulation defect, unspecified: Secondary | ICD-10-CM | POA: Diagnosis not present

## 2023-04-08 DIAGNOSIS — Z7901 Long term (current) use of anticoagulants: Secondary | ICD-10-CM | POA: Diagnosis not present

## 2023-04-08 DIAGNOSIS — I2692 Saddle embolus of pulmonary artery without acute cor pulmonale: Secondary | ICD-10-CM

## 2023-04-08 DIAGNOSIS — Z86711 Personal history of pulmonary embolism: Secondary | ICD-10-CM | POA: Diagnosis not present

## 2023-04-08 DIAGNOSIS — D696 Thrombocytopenia, unspecified: Secondary | ICD-10-CM | POA: Diagnosis not present

## 2023-04-08 DIAGNOSIS — R161 Splenomegaly, not elsewhere classified: Secondary | ICD-10-CM | POA: Diagnosis not present

## 2023-04-08 NOTE — Patient Instructions (Addendum)
[facility] Discharge Instructions   You were seen and examined today by Dr. Ellin Saba.  He discussed with you the results of the lab work we did called lupus anticoagulant. It was negative. Your platelet count is also low, but not low enough to cause any issues at this time. We will continue to monitor.   Dr. Kirtland Bouchard recommends you stay on the blood thinner since we do not have a reason for the cause of it. This will prevent it from coming back.   We will see you back in 4 months. We will repeat lab work one week prior to your next visit.    Thank you for choosing [facility] at [facility] to provide your oncology and hematology care.  To afford each patient quality time with our provider, please arrive at least 15 minutes before your scheduled appointment time.   If you have a lab appointment with the Cancer Center please come in thru the Main Entrance and check in at the main information desk.  You need to re-schedule your appointment should you arrive 10 or more minutes late.  We strive to give you quality time with our providers, and arriving late affects you and other patients whose appointments are after yours.  Also, if you no show three or more times for appointments you may be dismissed from the clinic at the providers discretion.     Again, thank you for choosing [facility].  Our hope is that these requests will decrease the amount of time that you wait before being seen by our physicians.       _____________________________________________________________  Should you have questions after your visit to [facility], please contact our office at [phone] and follow the prompts.  Our office hours are 8:00 a.m. and 4:30 p.m. Monday - Friday.  Please note that voicemails left after 4:00 p.m. may not be returned until the following business day.  We are closed weekends and major holidays.  You do have  access to a nurse 24-7, just call the main number to the clinic [phone] and do not press any options, hold on the line and a nurse will answer the phone.    For prescription refill requests, have your pharmacy contact our office and allow 72 hours.    Due to Covid, you will need to wear a mask upon entering the hospital. If you do not have a mask, a mask will be given to you at the Main Entrance upon arrival. For doctor visits, patients may have 1 support person age 76 or older with them. For treatment visits, patients can not have anyone with them due to social distancing guidelines and our immunocompromised population.

## 2023-04-09 LAB — PROTHROMBIN GENE MUTATION

## 2023-04-13 ENCOUNTER — Encounter: Payer: Self-pay | Admitting: Internal Medicine

## 2023-04-13 NOTE — Assessment & Plan Note (Signed)
A1c 5.9 in March.  He is currently prescribed [name] 5 mg weekly, metformin 1000 mg twice daily, and Farxiga 5 mg daily.  He is interested in discontinuing metformin given that his diabetes is well-controlled. -Through shared decision making, metformin has been discontinued.  Can further increase [name] if necessary proved glycemic control.  We will tentatively plan for follow-up in 4 months and repeat labs at that time. -Ophthalmology referral placed today for diabetic eye exam

## 2023-04-13 NOTE — Assessment & Plan Note (Signed)
Followed by sleep clinic (Dr. Vassie Loll).  Severe OSA with oxygen desaturations noted on HST completed earlier this month.  PAP titration is pending.

## 2023-04-13 NOTE — Assessment & Plan Note (Signed)
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  He remains on Eliquis 5 mg twice daily.  Follow-up with hematology as scheduled for tomorrow to determine appropriate duration of anticoagulation.

## 2023-04-20 LAB — FACTOR 5 LEIDEN

## 2023-04-28 ENCOUNTER — Other Ambulatory Visit: Payer: Self-pay

## 2023-04-28 DIAGNOSIS — D229 Melanocytic nevi, unspecified: Secondary | ICD-10-CM

## 2023-04-28 DIAGNOSIS — R1310 Dysphagia, unspecified: Secondary | ICD-10-CM

## 2023-04-30 ENCOUNTER — Ambulatory Visit: Payer: Medicare Other | Admitting: Student

## 2023-05-11 ENCOUNTER — Encounter (INDEPENDENT_AMBULATORY_CARE_PROVIDER_SITE_OTHER): Payer: Self-pay | Admitting: *Deleted

## 2023-05-21 ENCOUNTER — Other Ambulatory Visit: Payer: Self-pay | Admitting: Internal Medicine

## 2023-05-21 ENCOUNTER — Other Ambulatory Visit: Payer: Self-pay

## 2023-05-21 MED ORDER — APIXABAN 5 MG PO TABS: 5.0000 mg | ORAL_TABLET | Freq: Two times a day (BID) | ORAL | 2 refills | Status: DC

## 2023-05-23 ENCOUNTER — Other Ambulatory Visit: Payer: Self-pay | Admitting: Internal Medicine

## 2023-05-23 DIAGNOSIS — E1100 Type 2 diabetes mellitus with hyperosmolarity without nonketotic hyperglycemic-hyperosmolar coma (NKHHC): Secondary | ICD-10-CM

## 2023-05-29 ENCOUNTER — Other Ambulatory Visit: Payer: Self-pay | Admitting: Internal Medicine

## 2023-05-30 ENCOUNTER — Other Ambulatory Visit: Payer: Self-pay | Admitting: Internal Medicine

## 2023-06-03 ENCOUNTER — Encounter (INDEPENDENT_AMBULATORY_CARE_PROVIDER_SITE_OTHER): Payer: Self-pay | Admitting: Gastroenterology

## 2023-06-03 ENCOUNTER — Ambulatory Visit (INDEPENDENT_AMBULATORY_CARE_PROVIDER_SITE_OTHER): Payer: 59 | Admitting: Gastroenterology

## 2023-06-03 ENCOUNTER — Other Ambulatory Visit: Payer: Self-pay | Admitting: Internal Medicine

## 2023-06-03 VITALS — BP 146/84 | HR 70 | Temp 98.7°F | Ht 69.0 in | Wt 189.1 lb

## 2023-06-03 DIAGNOSIS — R1310 Dysphagia, unspecified: Secondary | ICD-10-CM | POA: Diagnosis not present

## 2023-06-03 DIAGNOSIS — R195 Other fecal abnormalities: Secondary | ICD-10-CM

## 2023-06-03 DIAGNOSIS — K7460 Unspecified cirrhosis of liver: Secondary | ICD-10-CM | POA: Diagnosis not present

## 2023-06-03 MED ORDER — PEG 3350-KCL-NA BICARB-NACL 420 G PO SOLR: 4000.0000 mL | Freq: Once | ORAL | 0 refills | Status: AC

## 2023-06-03 NOTE — H&P (View-Only) (Signed)
 Referring Provider: [name], MD Primary Care Physician:  [name], MD Primary GI Physician: Dr. Levon Hedger   Chief Complaint  Patient presents with   Dysphagia    Follow up on dysphagia. Getting worse. Sometimes chokes and sometimes food just doesn't feel like it goes down. Happens every time he eats.    HPI:   [name] [name] is a 67 y.o. male with past medical history of myocardial infarction status post CABG, anxiety, asthma, Elita Boone cirrhosis, hyperlipidemia, OSA, and type 2 diabetes   Patient presenting today for dysphagia and with history of cirrhosis.   Last seen in 2021, at that time seen for NASH cirrhosis. At that time scheduled for EGD/colonoscopy, MELD labs/AFP, LIver US  Last dedicated liver imaging in October 2021, no focal lesions, nodular contour with coarse hepatic echotexture suggestive of cirrhosis.   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  Present: Patient notes having a lot of issues with his swallowing. Began a while back but has gradually gotten worse. He feels that foods stick in mid to lower throat. He has issues with most all foods, no issues with liquids. Can usually get foods to pass down with drinking something. Sometimes has to cough the foods back up. Larger pills sometimes get stuck as well. Denies pain when he swallows. Denies GERD symptoms, feels symptoms well controlled on omeprazole. He has some nausea, occasional vomiting.   Denies swelling to his abdomen, states he has lost some weight taking moujaro. No jaundice, pruritus or episodes of confusion.   He notes that his stools are "flat and skinny." He notes that this has been occurring for "a long time" he is unsure how long exactly. He is having a BM daily. He notes some pain in his abdomen prior to defecation but states that this improves once he is able to go to the  restroom. He thinks stool changes may have occurred after the last colonoscopy. Occasionally he will have a more normal sized stool but typically stools are flat and skinny. No rectal bleeding or melena.  BP elevated this morning, notes he forgot to take his BP meds last night, reports BP at home is usually 100 systolic and HR around 60.   Last Colonoscopy:09/2020  - Hemorrhoids found on perianal exam.                           - The entire examined colon is normal on direct and                            retroflexion views.                           - No specimens collected Last Endoscopy: 09/2020 - Grade I esophageal varices.                           - Small hiatal hernia.                           - Portal hypertensive gastropathy.                           -  Normal examined duodenum.                           - No specimens collected. (Started on carvedilol 3.125mg  BID)  Recommendations:    Past Medical History:  Diagnosis Date   Anxiety    Aortic insufficiency    Arthritis    Asthma    Cataract    Right eye   Chronic lower back pain    Cirrhosis of liver (HCC)    Coronary atherosclerosis of native coronary artery    a. 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   LIMA to LAD, free RIMA to OM2, SVG to PDA, open SVG harvest from right thigh   Stroke (HCC)    Thrombocytopenia (HCC)    TIA (transient ischemic attack)    Type 2 diabetes mellitus (HCC)     Past Surgical History:  Procedure Laterality Date   ANTERIOR CERVICAL DECOMP/DISCECTOMY FUSION  1998   "C3-4"   CARDIAC CATHETERIZATION  "several"    CARDIAC CATHETERIZATION N/A 12/30/2015   Procedure: Left Heart Cath and Coronary Angiography;  Surgeon: Runell Gess, MD;  Location: [facility];  Service: Cardiovascular;  Laterality: N/A;   CARPAL TUNNEL RELEASE Bilateral 2005   CHOLECYSTECTOMY N/A 11/28/2018   Procedure: LAPAROSCOPIC CHOLECYSTECTOMY;  Surgeon: Lucretia Roers, MD;  Location: [facility];  Service: General;  Laterality: N/A;   COLONOSCOPY     COLONOSCOPY WITH PROPOFOL N/A 09/27/2020   Procedure: COLONOSCOPY WITH PROPOFOL;  Surgeon: Dolores Frame, MD;  Location: [facility];  Service: Gastroenterology;  Laterality: N/A;   CORONARY ANGIOPLASTY     CORONARY ANGIOPLASTY WITH STENT PLACEMENT  2002; 2003; 11/20/2014   "I have 4 stents after today" (11/20/2014)   CORONARY ARTERY BYPASS GRAFT N/A 01/09/2016   Procedure: CORONARY ARTERY BYPASS GRAFTING (CABG) X 3 UTILIZING RIGHT AND LEFT INTERNAL MAMMARY ARTERY AND ENDOSCOPICALLY HARVESTED SAPHENEOUS VEIN.;  Surgeon: Purcell Nails, MD;  Location: [facility] OR;  Service: Open Heart Surgery;  Laterality: N/A;   ESOPHAGOGASTRODUODENOSCOPY     ESOPHAGOGASTRODUODENOSCOPY (EGD) WITH PROPOFOL N/A 09/27/2020   Procedure: ESOPHAGOGASTRODUODENOSCOPY (EGD) WITH PROPOFOL;  Surgeon: Dolores Frame, MD;  Location: [facility];  Service: Gastroenterology;  Laterality: N/A;  10   KNEE SURGERY Left 02/2012   "scraped; open"   LEFT HEART CATH AND CORS/GRAFTS ANGIOGRAPHY N/A 08/09/2017   Procedure: LEFT HEART CATH AND CORS/GRAFTS ANGIOGRAPHY;  Surgeon: Yvonne Kendall, MD;  [facility]: [facility];  Service: Cardiovascular;  Laterality: N/A;   LEFT HEART CATH AND CORS/GRAFTS ANGIOGRAPHY N/A 11/22/2018   Procedure: LEFT HEART CATH AND CORS/GRAFTS ANGIOGRAPHY;  Surgeon: Swaziland, [name], MD;  Location: [facility] INVASIVE CV LAB;  Service: Cardiovascular;  Laterality: N/A;   LEFT HEART CATH AND CORS/GRAFTS ANGIOGRAPHY N/A 08/20/2022   Procedure: LEFT HEART CATH AND CORS/GRAFTS  ANGIOGRAPHY;  Surgeon: Kathleene Hazel, MD;  Location: [facility];  Service: Cardiovascular;  Laterality: N/A;   LEFT HEART  CATHETERIZATION WITH CORONARY ANGIOGRAM N/A 07/20/2012   Procedure: LEFT HEART CATHETERIZATION WITH CORONARY ANGIOGRAM;  Surgeon: Iran Ouch, MD;  Location: [facility];  Service: Cardiovascular;  Laterality: N/A;   LEFT HEART CATHETERIZATION WITH CORONARY ANGIOGRAM N/A 11/20/2014   Procedure: LEFT HEART CATHETERIZATION WITH CORONARY ANGIOGRAM;  Surgeon: [name] Swaziland, MD;  Location: [facility] CATH LAB;  Service: Cardiovascular;  Laterality: N/A;   LEFT HEART CATHETERIZATION WITH CORONARY ANGIOGRAM N/A 11/26/2014   Procedure: LEFT HEART CATHETERIZATION WITH CORONARY ANGIOGRAM;  Surgeon: [name] Swaziland, MD;  Location: [facility] CATH LAB;  Service: Cardiovascular;  Laterality: N/A;   NEUROPLASTY / TRANSPOSITION ULNAR NERVE AT ELBOW Right ~ 2012   PERCUTANEOUS CORONARY ROTOBLATOR INTERVENTION (PCI-R)  11/20/2014   Procedure: PERCUTANEOUS CORONARY ROTOBLATOR INTERVENTION (PCI-R);  Surgeon: [name] Swaziland, MD;  Location: [facility] CATH LAB;  Service: Cardiovascular;;   POSTERIOR CERVICAL LAMINECTOMY Left 04/16/2022   Procedure: Laminectomy and Foraminotomy - left - C6-C7;  Surgeon: Julio Sicks, MD;  Location: [facility] OR;  Service: Neurosurgery;  Laterality: Left;  3C   SHOULDER ARTHROSCOPY Left ~ 2011   TEE WITHOUT CARDIOVERSION N/A 01/09/2016   Procedure: TRANSESOPHAGEAL ECHOCARDIOGRAM (TEE);  Surgeon: Purcell Nails, MD;  Location: [facility] OR;  Service: Open Heart Surgery;  Laterality: N/A;    Current Outpatient Medications  Medication Sig Dispense Refill   albuterol (VENTOLIN HFA) 108 (90 Base) MCG/ACT inhaler Inhale 2 puffs into the lungs every 6 (six) hours as needed for wheezing or shortness of breath.     apixaban (ELIQUIS) 5 MG TABS tablet Take 1 tablet (5 mg total) by mouth 2 (two) times daily. 60 tablet 2   carvedilol (COREG) 3.125 MG tablet Take 1 tablet (3.125 mg total) by mouth 2  (two) times daily. 60 tablet 3   citalopram (CELEXA) 20 MG tablet Take 1 tablet (20 mg total) by mouth at bedtime. 90 tablet 0   Continuous Blood Gluc Receiver (FREESTYLE LIBRE 2 READER) DEVI 1 Device by Does not apply route continuous. 1 each 0   Continuous Blood Gluc Sensor (FREESTYLE LIBRE 2 SENSOR) MISC Apply one sensor every 14 days 6 each 3   cyclobenzaprine (FLEXERIL) 10 MG tablet TAKE 1 TABLET BY MOUTH THREE TIMES DAILY AS NEEDED FOR MUSCLE SPASMS 30 tablet 10   ezetimibe (ZETIA) 10 MG tablet Take 1 tablet (10 mg total) by mouth daily. 90 tablet 0   fluticasone (FLONASE) 50 MCG/ACT nasal spray Place 2 sprays into both nostrils 2 (two) times daily. 15.8 mL 3   Fluticasone-Salmeterol (ADVAIR) 250-50 MCG/DOSE AEPB Inhale 1 puff into the lungs every 12 (twelve) hours.     gabapentin (NEURONTIN) 300 MG capsule TAKE 1 CAPSULE BY MOUTH THREE TIMES DAILY 90 capsule 10   isosorbide mononitrate (IMDUR) 60 MG 24 hr tablet Take one (1) tablet by mouth ( 60 mg ) in the am and one half tablet ( 30 mg) in the pm. 90 tablet 1   Melatonin 10 MG TABS Take 10 mg by mouth at bedtime.     [name] 5 MG/0.5ML Pen INJECT 5 MG INTO THE SKIN ONCE A WEEK 12 mL 0   nitroGLYCERIN (NITROSTAT) 0.4 MG SL tablet DISSOLVE 1 TABLET UNDER THE TONGUE AS NEEDED FOR CHEST PAIN EVERY 5 MINUTES UP TO 3 TIMES. IF NO RELIEF CALL 911. 25 tablet 10   omeprazole (PRILOSEC) 40 MG capsule TAKE 1 CAPSULE BY MOUTH DAILY 30 capsule 10   rosuvastatin (CRESTOR) 20 MG tablet Take 1 tablet (20 mg total)  by mouth daily. 30 tablet 2   zonisamide (ZONEGRAN) 100 MG capsule Take 100 mg by mouth daily.     dapagliflozin propanediol (FARXIGA) 5 MG TABS tablet TAKE 1 TABLET BY MOUTH ONCE DAILY BEFORE BREAKFAST (Patient not taking: Reported on 06/03/2023) 90 tablet 0   No current facility-administered medications for this visit.    Allergies as of 06/03/2023 - Review Complete 06/03/2023  Allergen Reaction Noted   Divalproex sodium Other (See  Comments) 03/07/2018   Statins Other (See Comments) 12/03/2014   Tramadol Other (See Comments) 03/29/2017   Valproic acid Other (See Comments) 09/14/2016   Gadolinium derivatives Nausea And Vomiting 07/25/2019   Tricor [fenofibrate] Other (See Comments) 01/08/2015    Family History  Problem Relation Age of Onset   Stroke Mother    Coronary artery disease Father 63   Asthma Sister    Multiple sclerosis Brother    Heart disease Paternal Uncle    Stroke Maternal Grandmother    Heart attack Paternal Grandmother    Cancer Paternal Grandfather    Asthma Sister    Seizures Son    Migraines Son    Autism Son    Migraines Son     Social History   Socioeconomic History   Marital status: Married    Spouse name: [name]   Number of children: 3   Years of education: 12 th    Highest education level: Not on file  Occupational History   Occupation: Unemployed  Tobacco Use   Smoking status: Never    Passive exposure: Current   Smokeless tobacco: Never  Vaping Use   Vaping status: Never Used  Substance and Sexual Activity   Alcohol use: No   Drug use: No   Sexual activity: Not on file  Other Topics Concern   Not on file  Social History Narrative   Patient lives at home with wife [name].   Patient works at [employer], Social research officer, government.   Patient has a 12 th grade education.    Patient has 3 children.       Social Determinants of Health   Financial Resource Strain: Low Risk  (02/03/2023)   Overall Financial Resource Strain (CARDIA)    Difficulty of Paying Living Expenses: Not hard at all  Food Insecurity: No Food Insecurity (02/03/2023)   Hunger Vital Sign    Worried About Running Out of Food in the Last Year: Never true    Ran Out of Food in the Last Year: Never true  Transportation Needs: No Transportation Needs (02/03/2023)   PRAPARE - Administrator, Civil Service (Medical): No    Lack of Transportation (Non-Medical): No  Physical Activity:  Sufficiently Active (11/23/2022)   Exercise Vital Sign    Days of Exercise per Week: 7 days    Minutes of Exercise per Session: 30 min  Stress: No Stress Concern Present (11/23/2022)   [employer] of Occupational Health - Occupational Stress Questionnaire    Feeling of Stress : Not at all  Social Connections: Unknown (03/22/2022)   Received from [facility]   Social Network    Social Network: Not on file    Review of systems General: negative for malaise, night sweats, fever, chills, weight los Neck: Negative for lumps, goiter, pain and significant neck swelling Resp: Negative for cough, wheezing, dyspnea at rest CV: Negative for chest pain, leg swelling, palpitations, orthopnea GI: denies melena, hematochezia, nausea, vomiting, diarrhea, constipation, dysphagia, odyonophagia, early satiety or unintentional weight loss.  MSK: Negative for joint pain or swelling, back pain, and muscle pain. Derm: Negative for itching or rash Psych: Denies depression, anxiety, memory loss, confusion. No homicidal or suicidal ideation.  Heme: Negative for prolonged bleeding, bruising easily, and swollen nodes. Endocrine: Negative for cold or heat intolerance, polyuria, polydipsia and goiter. Neuro: negative for tremor, gait imbalance, syncope and seizures. The remainder of the review of systems is noncontributory.  Physical Exam: BP (!) 146/84   Pulse 70   Temp 98.7 F (37.1 C) (Oral)   Ht 5\' 9"  (1.753 m)   Wt 189 lb 1.6 oz (85.8 kg)   BMI 27.93 kg/m  General:   Alert and oriented. No distress noted. Pleasant and cooperative.  Head:  Normocephalic and atraumatic. Eyes:  Conjuctiva clear without scleral icterus. Mouth:  Oral mucosa pink and moist. Good dentition. No lesions. Heart: Normal rate and rhythm, s1 and s2 heart sounds present.  Lungs: Clear lung sounds in all lobes. Respirations equal and unlabored. Abdomen:  +BS, soft, non-tender and non-distended. No rebound or guarding. No HSM  or masses noted. Derm: No palmar erythema or jaundice Msk:  Symmetrical without gross deformities. Normal posture. Extremities:  Without edema. Neurologic:  Alert and  oriented x4 Psych:  Alert and cooperative. Normal mood and affect.  Invalid input(s): "6 MONTHS"   ASSESSMENT: [name] [name] is a 67 y.o. male presenting today for dysphagia, also with history of cirrhosis and change in stool caliber.  Dysphagia:dysphagia gradually worsening over the past few months, usually with solid foods. No GERD symptoms. Recommend proceeding with EGD for further evaluation as I cannot rule out esophageal ring, web, stricture, stenosis, or esophagitis.   Change in stool caliber: notes flat/skinny stools for some time, thinks this began after last TCS in 2021.denies rectal bleeding or melena. Recommend proceeding with colonoscopy for further evaluation of change in stool caliber.   As he is recently on Eliquis for DVT/PE in February, will need complete 6 months of ACs prior to any holds for procedures, should obtain clearance from Dr. Ellin Saba to hold eliquis for EGD/Colonoscopy after 6 month mark of therapy.   Cirrhosis:lost to follow up for cirrhosis care since 2022. Appears relatively well compensated. Denies episodes of confusion, ascites, jaundice, pruritus. Last EGD in 2021 with grade 1 EVs, he was started on carvedilol 3.125mg  BID at that time, HR today is 70 and not at goal on NSBB though he reports he did not take his medications yet. He is overdue for [facility] screening and AFP tumor marker. Most recent MELD 3.0 was 14 with March/April labs, as platelet count is 74k in May, can re evaluate status of EVs on EGD, as above. Will continue with carvedilol 3.125mg  BID for now.   PLAN:  -RUQ [place]  - AFP tumor marker -repeat MELD labs September 2024 - Reduce salt intake to <2 g per day - Can take Tylenol max of 2 g per day (650 mg q8h) for pain - Avoid NSAIDs for pain - Avoid eating raw  oysters/shellfish - Ensure every night before going to sleep -EGD/Colonoscopy (will need 6 month therapy of eliquis prior to holding for procedure-end of August, clearance with Dr. Kirtland Bouchard to hold ) ASA III -continue carvedilol 3.125mg  BID  All questions were answered, patient verbalized understanding and is in agreement with plan as outlined above.   Follow Up: 3 months   Chelsea L. Jeanmarie Hubert, MSN, APRN, AGNP-C Adult-Gerontology Nurse Practitioner [facility]  I have reviewed the note and  agree with the APP's assessment as described in this progress note  Unfortunately, there have been some compliance issues with Coreg, which should be started. If he needs dilation, this may be difficult if he has coexistent varices as he had G1 EV in the past. May consider TIPS in that scenario.  Katrinka Blazing, MD Gastroenterology and Hepatology [facility] Gastroenterology

## 2023-06-03 NOTE — Patient Instructions (Signed)
We will update some labs and US of the liver in regards to your cirrhosis As discussed, it is important we do labs and imaging of the liver every 6 months - Reduce salt intake to <2 g per day - Can take Tylenol max of 2 g per day (650 mg q8h) for pain - Avoid NSAIDs for pain - Avoid eating raw oysters/shellfish - Ensure every night before going to sleep  As you are recently on eliquis, we will need to wait until the 6 month mark (end of August) before we can schedule upper endoscopy to evaluate your swallowing and colonoscopy to evaluate change in stools. We will reach out to you to schedule closer to time. Please make sure to chew thoroughly, avoid thicker, drier foods, take sips of liquids between bites.  Follow up 3 months

## 2023-06-03 NOTE — Progress Notes (Addendum)
Referring Provider: [name], MD Primary Care Physician:  [name], MD Primary GI Physician: Dr. Levon Hedger   Chief Complaint  Patient presents with   Dysphagia    Follow up on dysphagia. Getting worse. Sometimes chokes and sometimes food just doesn't feel like it goes down. Happens every time he eats.    HPI:   [name] [name] is a 67 y.o. male with past medical history of myocardial infarction status post CABG, anxiety, asthma, Elita Boone cirrhosis, hyperlipidemia, OSA, and type 2 diabetes   Patient presenting today for dysphagia and with history of cirrhosis.   Last seen in 2021, at that time seen for NASH cirrhosis. At that time scheduled for EGD/colonoscopy, MELD labs/AFP, LIver US  Last dedicated liver imaging in October 2021, no focal lesions, nodular contour with coarse hepatic echotexture suggestive of cirrhosis.   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  Present: Patient notes having a lot of issues with his swallowing. Began a while back but has gradually gotten worse. He feels that foods stick in mid to lower throat. He has issues with most all foods, no issues with liquids. Can usually get foods to pass down with drinking something. Sometimes has to cough the foods back up. Larger pills sometimes get stuck as well. Denies pain when he swallows. Denies GERD symptoms, feels symptoms well controlled on omeprazole. He has some nausea, occasional vomiting.   Denies swelling to his abdomen, states he has lost some weight taking moujaro. No jaundice, pruritus or episodes of confusion.   He notes that his stools are "flat and skinny." He notes that this has been occurring for "a long time" he is unsure how long exactly. He is having a BM daily. He notes some pain in his abdomen prior to defecation but states that this improves once he is able to go to the  restroom. He thinks stool changes may have occurred after the last colonoscopy. Occasionally he will have a more normal sized stool but typically stools are flat and skinny. No rectal bleeding or melena.  BP elevated this morning, notes he forgot to take his BP meds last night, reports BP at home is usually 100 systolic and HR around 60.   Last Colonoscopy:09/2020  - Hemorrhoids found on perianal exam.                           - The entire examined colon is normal on direct and                            retroflexion views.                           - No specimens collected Last Endoscopy: 09/2020 - Grade I esophageal varices.                           - Small hiatal hernia.                           - Portal hypertensive gastropathy.                           -  Normal examined duodenum.                           - No specimens collected. (Started on carvedilol 3.125mg  BID)  Recommendations:    Past Medical History:  Diagnosis Date   Anxiety    Aortic insufficiency    Arthritis    Asthma    Cataract    Right eye   Chronic lower back pain    Cirrhosis of liver (HCC)    Coronary atherosclerosis of native coronary artery    a. 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   LIMA to LAD, free RIMA to OM2, SVG to PDA, open SVG harvest from right thigh   Stroke (HCC)    Thrombocytopenia (HCC)    TIA (transient ischemic attack)    Type 2 diabetes mellitus (HCC)     Past Surgical History:  Procedure Laterality Date   ANTERIOR CERVICAL DECOMP/DISCECTOMY FUSION  1998   "C3-4"   CARDIAC CATHETERIZATION  "several"    CARDIAC CATHETERIZATION N/A 12/30/2015   Procedure: Left Heart Cath and Coronary Angiography;  Surgeon: Runell Gess, MD;  Location: [facility];  Service: Cardiovascular;  Laterality: N/A;   CARPAL TUNNEL RELEASE Bilateral 2005   CHOLECYSTECTOMY N/A 11/28/2018   Procedure: LAPAROSCOPIC CHOLECYSTECTOMY;  Surgeon: Lucretia Roers, MD;  Location: [facility];  Service: General;  Laterality: N/A;   COLONOSCOPY     COLONOSCOPY WITH PROPOFOL N/A 09/27/2020   Procedure: COLONOSCOPY WITH PROPOFOL;  Surgeon: Dolores Frame, MD;  Location: [facility];  Service: Gastroenterology;  Laterality: N/A;   CORONARY ANGIOPLASTY     CORONARY ANGIOPLASTY WITH STENT PLACEMENT  2002; 2003; 11/20/2014   "I have 4 stents after today" (11/20/2014)   CORONARY ARTERY BYPASS GRAFT N/A 01/09/2016   Procedure: CORONARY ARTERY BYPASS GRAFTING (CABG) X 3 UTILIZING RIGHT AND LEFT INTERNAL MAMMARY ARTERY AND ENDOSCOPICALLY HARVESTED SAPHENEOUS VEIN.;  Surgeon: Purcell Nails, MD;  Location: [facility] OR;  Service: Open Heart Surgery;  Laterality: N/A;   ESOPHAGOGASTRODUODENOSCOPY     ESOPHAGOGASTRODUODENOSCOPY (EGD) WITH PROPOFOL N/A 09/27/2020   Procedure: ESOPHAGOGASTRODUODENOSCOPY (EGD) WITH PROPOFOL;  Surgeon: Dolores Frame, MD;  Location: [facility];  Service: Gastroenterology;  Laterality: N/A;  10   KNEE SURGERY Left 02/2012   "scraped; open"   LEFT HEART CATH AND CORS/GRAFTS ANGIOGRAPHY N/A 08/09/2017   Procedure: LEFT HEART CATH AND CORS/GRAFTS ANGIOGRAPHY;  Surgeon: Yvonne Kendall, MD;  [facility]: [facility];  Service: Cardiovascular;  Laterality: N/A;   LEFT HEART CATH AND CORS/GRAFTS ANGIOGRAPHY N/A 11/22/2018   Procedure: LEFT HEART CATH AND CORS/GRAFTS ANGIOGRAPHY;  Surgeon: Swaziland, [name], MD;  Location: [facility] INVASIVE CV LAB;  Service: Cardiovascular;  Laterality: N/A;   LEFT HEART CATH AND CORS/GRAFTS ANGIOGRAPHY N/A 08/20/2022   Procedure: LEFT HEART CATH AND CORS/GRAFTS  ANGIOGRAPHY;  Surgeon: Kathleene Hazel, MD;  [facility]: [facility];  Service: Cardiovascular;  Laterality: N/A;   LEFT HEART  CATHETERIZATION WITH CORONARY ANGIOGRAM N/A 07/20/2012   Procedure: LEFT HEART CATHETERIZATION WITH CORONARY ANGIOGRAM;  Surgeon: Iran Ouch, MD;  Location: [facility];  Service: Cardiovascular;  Laterality: N/A;   LEFT HEART CATHETERIZATION WITH CORONARY ANGIOGRAM N/A 11/20/2014   Procedure: LEFT HEART CATHETERIZATION WITH CORONARY ANGIOGRAM;  Surgeon: [name] Swaziland, MD;  Location: [facility] CATH LAB;  Service: Cardiovascular;  Laterality: N/A;   LEFT HEART CATHETERIZATION WITH CORONARY ANGIOGRAM N/A 11/26/2014   Procedure: LEFT HEART CATHETERIZATION WITH CORONARY ANGIOGRAM;  Surgeon: [name] Swaziland, MD;  Location: [facility] CATH LAB;  Service: Cardiovascular;  Laterality: N/A;   NEUROPLASTY / TRANSPOSITION ULNAR NERVE AT ELBOW Right ~ 2012   PERCUTANEOUS CORONARY ROTOBLATOR INTERVENTION (PCI-R)  11/20/2014   Procedure: PERCUTANEOUS CORONARY ROTOBLATOR INTERVENTION (PCI-R);  Surgeon: [name] Swaziland, MD;  Location: [facility] CATH LAB;  Service: Cardiovascular;;   POSTERIOR CERVICAL LAMINECTOMY Left 04/16/2022   Procedure: Laminectomy and Foraminotomy - left - C6-C7;  Surgeon: Julio Sicks, MD;  Location: [facility] OR;  Service: Neurosurgery;  Laterality: Left;  3C   SHOULDER ARTHROSCOPY Left ~ 2011   TEE WITHOUT CARDIOVERSION N/A 01/09/2016   Procedure: TRANSESOPHAGEAL ECHOCARDIOGRAM (TEE);  Surgeon: Purcell Nails, MD;  Location: [facility] OR;  Service: Open Heart Surgery;  Laterality: N/A;    Current Outpatient Medications  Medication Sig Dispense Refill   albuterol (VENTOLIN HFA) 108 (90 Base) MCG/ACT inhaler Inhale 2 puffs into the lungs every 6 (six) hours as needed for wheezing or shortness of breath.     apixaban (ELIQUIS) 5 MG TABS tablet Take 1 tablet (5 mg total) by mouth 2 (two) times daily. 60 tablet 2   carvedilol (COREG) 3.125 MG tablet Take 1 tablet (3.125 mg total) by mouth 2  (two) times daily. 60 tablet 3   citalopram (CELEXA) 20 MG tablet Take 1 tablet (20 mg total) by mouth at bedtime. 90 tablet 0   Continuous Blood Gluc Receiver (FREESTYLE LIBRE 2 READER) DEVI 1 Device by Does not apply route continuous. 1 each 0   Continuous Blood Gluc Sensor (FREESTYLE LIBRE 2 SENSOR) MISC Apply one sensor every 14 days 6 each 3   cyclobenzaprine (FLEXERIL) 10 MG tablet TAKE 1 TABLET BY MOUTH THREE TIMES DAILY AS NEEDED FOR MUSCLE SPASMS 30 tablet 10   ezetimibe (ZETIA) 10 MG tablet Take 1 tablet (10 mg total) by mouth daily. 90 tablet 0   fluticasone (FLONASE) 50 MCG/ACT nasal spray Place 2 sprays into both nostrils 2 (two) times daily. 15.8 mL 3   Fluticasone-Salmeterol (ADVAIR) 250-50 MCG/DOSE AEPB Inhale 1 puff into the lungs every 12 (twelve) hours.     gabapentin (NEURONTIN) 300 MG capsule TAKE 1 CAPSULE BY MOUTH THREE TIMES DAILY 90 capsule 10   isosorbide mononitrate (IMDUR) 60 MG 24 hr tablet Take one (1) tablet by mouth ( 60 mg ) in the am and one half tablet ( 30 mg) in the pm. 90 tablet 1   Melatonin 10 MG TABS Take 10 mg by mouth at bedtime.     [name] 5 MG/0.5ML Pen INJECT 5 MG INTO THE SKIN ONCE A WEEK 12 mL 0   nitroGLYCERIN (NITROSTAT) 0.4 MG SL tablet DISSOLVE 1 TABLET UNDER THE TONGUE AS NEEDED FOR CHEST PAIN EVERY 5 MINUTES UP TO 3 TIMES. IF NO RELIEF CALL 911. 25 tablet 10   omeprazole (PRILOSEC) 40 MG capsule TAKE 1 CAPSULE BY MOUTH DAILY 30 capsule 10   rosuvastatin (CRESTOR) 20 MG tablet Take 1 tablet (20 mg total)  by mouth daily. 30 tablet 2   zonisamide (ZONEGRAN) 100 MG capsule Take 100 mg by mouth daily.     dapagliflozin propanediol (FARXIGA) 5 MG TABS tablet TAKE 1 TABLET BY MOUTH ONCE DAILY BEFORE BREAKFAST (Patient not taking: Reported on 06/03/2023) 90 tablet 0   No current facility-administered medications for this visit.    Allergies as of 06/03/2023 - Review Complete 06/03/2023  Allergen Reaction Noted   Divalproex sodium Other (See  Comments) 03/07/2018   Statins Other (See Comments) 12/03/2014   Tramadol Other (See Comments) 03/29/2017   Valproic acid Other (See Comments) 09/14/2016   Gadolinium derivatives Nausea And Vomiting 07/25/2019   Tricor [fenofibrate] Other (See Comments) 01/08/2015    Family History  Problem Relation Age of Onset   Stroke Mother    Coronary artery disease Father 63   Asthma Sister    Multiple sclerosis Brother    Heart disease Paternal Uncle    Stroke Maternal Grandmother    Heart attack Paternal Grandmother    Cancer Paternal Grandfather    Asthma Sister    Seizures Son    Migraines Son    Autism Son    Migraines Son     Social History   Socioeconomic History   Marital status: Married    Spouse name: [name]   Number of children: 3   Years of education: 12 th    Highest education level: Not on file  Occupational History   Occupation: Unemployed  Tobacco Use   Smoking status: Never    Passive exposure: Current   Smokeless tobacco: Never  Vaping Use   Vaping status: Never Used  Substance and Sexual Activity   Alcohol use: No   Drug use: No   Sexual activity: Not on file  Other Topics Concern   Not on file  Social History Narrative   Patient lives at home with wife [name].   Patient works at [employer], Social research officer, government.   Patient has a 12 th grade education.    Patient has 3 children.       Social Determinants of Health   Financial Resource Strain: Low Risk  (02/03/2023)   Overall Financial Resource Strain (CARDIA)    Difficulty of Paying Living Expenses: Not hard at all  Food Insecurity: No Food Insecurity (02/03/2023)   Hunger Vital Sign    Worried About Running Out of Food in the Last Year: Never true    Ran Out of Food in the Last Year: Never true  Transportation Needs: No Transportation Needs (02/03/2023)   PRAPARE - Administrator, Civil Service (Medical): No    Lack of Transportation (Non-Medical): No  Physical Activity:  Sufficiently Active (11/23/2022)   Exercise Vital Sign    Days of Exercise per Week: 7 days    Minutes of Exercise per Session: 30 min  Stress: No Stress Concern Present (11/23/2022)   [employer] of Occupational Health - Occupational Stress Questionnaire    Feeling of Stress : Not at all  Social Connections: Unknown (03/22/2022)   Received from [facility]   Social Network    Social Network: Not on file    Review of systems General: negative for malaise, night sweats, fever, chills, weight los Neck: Negative for lumps, goiter, pain and significant neck swelling Resp: Negative for cough, wheezing, dyspnea at rest CV: Negative for chest pain, leg swelling, palpitations, orthopnea GI: denies melena, hematochezia, nausea, vomiting, diarrhea, constipation, dysphagia, odyonophagia, early satiety or unintentional weight loss.  MSK: Negative for joint pain or swelling, back pain, and muscle pain. Derm: Negative for itching or rash Psych: Denies depression, anxiety, memory loss, confusion. No homicidal or suicidal ideation.  Heme: Negative for prolonged bleeding, bruising easily, and swollen nodes. Endocrine: Negative for cold or heat intolerance, polyuria, polydipsia and goiter. Neuro: negative for tremor, gait imbalance, syncope and seizures. The remainder of the review of systems is noncontributory.  Physical Exam: BP (!) 146/84   Pulse 70   Temp 98.7 F (37.1 C) (Oral)   Ht 5\' 9"  (1.753 m)   Wt 189 lb 1.6 oz (85.8 kg)   BMI 27.93 kg/m  General:   Alert and oriented. No distress noted. Pleasant and cooperative.  Head:  Normocephalic and atraumatic. Eyes:  Conjuctiva clear without scleral icterus. Mouth:  Oral mucosa pink and moist. Good dentition. No lesions. Heart: Normal rate and rhythm, s1 and s2 heart sounds present.  Lungs: Clear lung sounds in all lobes. Respirations equal and unlabored. Abdomen:  +BS, soft, non-tender and non-distended. No rebound or guarding. No HSM  or masses noted. Derm: No palmar erythema or jaundice Msk:  Symmetrical without gross deformities. Normal posture. Extremities:  Without edema. Neurologic:  Alert and  oriented x4 Psych:  Alert and cooperative. Normal mood and affect.  Invalid input(s): "6 MONTHS"   ASSESSMENT: MARLEN MOLLICA is a 67 y.o. male presenting today for dysphagia, also with history of cirrhosis and change in stool caliber.  Dysphagia:dysphagia gradually worsening over the past few months, usually with solid foods. No GERD symptoms. Recommend proceeding with EGD for further evaluation as I cannot rule out esophageal ring, web, stricture, stenosis, or esophagitis.   Change in stool caliber: notes flat/skinny stools for some time, thinks this began after last TCS in 2021.denies rectal bleeding or melena. Recommend proceeding with colonoscopy for further evaluation of change in stool caliber.   As he is recently on Eliquis for DVT/PE in February, will need complete 6 months of ACs prior to any holds for procedures, should obtain clearance from Dr. Ellin Saba to hold eliquis for EGD/Colonoscopy after 6 month mark of therapy.   Cirrhosis:lost to follow up for cirrhosis care since 2022. Appears relatively well compensated. Denies episodes of confusion, ascites, jaundice, pruritus. Last EGD in 2021 with grade 1 EVs, he was started on carvedilol 3.125mg  BID at that time, HR today is 70 and not at goal on NSBB though he reports he did not take his medications yet. He is overdue for [facility] screening and AFP tumor marker. Most recent MELD 3.0 was 14 with March/April labs, as platelet count is 74k in May, can re evaluate status of EVs on EGD, as above. Will continue with carvedilol 3.125mg  BID for now.   PLAN:  -RUQ [place]  - AFP tumor marker -repeat MELD labs September 2024 - Reduce salt intake to <2 g per day - Can take Tylenol max of 2 g per day (650 mg q8h) for pain - Avoid NSAIDs for pain - Avoid eating raw  oysters/shellfish - Ensure every night before going to sleep -EGD/Colonoscopy (will need 6 month therapy of eliquis prior to holding for procedure-end of August, clearance with Dr. Kirtland Bouchard to hold ) ASA III -continue carvedilol 3.125mg  BID  All questions were answered, patient verbalized understanding and is in agreement with plan as outlined above.   Follow Up: 3 months   Ilaisaane Marts L. Jeanmarie Hubert, MSN, APRN, AGNP-C Adult-Gerontology Nurse Practitioner [facility]  I have reviewed the note and  agree with the APP's assessment as described in this progress note  Unfortunately, there have been some compliance issues with Coreg, which should be started. If he needs dilation, this may be difficult if he has coexistent varices as he had G1 EV in the past. May consider TIPS in that scenario.  Katrinka Blazing, MD Gastroenterology and Hepatology [facility] Gastroenterology

## 2023-06-04 DIAGNOSIS — R195 Other fecal abnormalities: Secondary | ICD-10-CM | POA: Insufficient documentation

## 2023-06-09 ENCOUNTER — Encounter (INDEPENDENT_AMBULATORY_CARE_PROVIDER_SITE_OTHER): Payer: Self-pay

## 2023-06-12 DIAGNOSIS — H40033 Anatomical narrow angle, bilateral: Secondary | ICD-10-CM | POA: Diagnosis not present

## 2023-06-12 DIAGNOSIS — E119 Type 2 diabetes mellitus without complications: Secondary | ICD-10-CM | POA: Diagnosis not present

## 2023-06-14 ENCOUNTER — Ambulatory Visit (HOSPITAL_COMMUNITY): Payer: 59

## 2023-06-15 ENCOUNTER — Ambulatory Visit (HOSPITAL_COMMUNITY)
Admission: RE | Admit: 2023-06-15 | Discharge: 2023-06-15 | Disposition: A | Discharge status: Home / Self Care | Payer: 59 | Source: Ambulatory Visit | Attending: Gastroenterology | Admitting: Gastroenterology

## 2023-06-15 DIAGNOSIS — K7460 Unspecified cirrhosis of liver: Secondary | ICD-10-CM | POA: Insufficient documentation

## 2023-06-15 DIAGNOSIS — K7689 Other specified diseases of liver: Secondary | ICD-10-CM | POA: Diagnosis not present

## 2023-06-21 ENCOUNTER — Telehealth (INDEPENDENT_AMBULATORY_CARE_PROVIDER_SITE_OTHER): Payer: Self-pay | Admitting: *Deleted

## 2023-06-21 ENCOUNTER — Other Ambulatory Visit: Payer: Self-pay | Admitting: Internal Medicine

## 2023-06-21 NOTE — Telephone Encounter (Signed)
Pt wife came in stating patient needed to reschedule his procedure on for 06/29/23. He was currently at work.

## 2023-06-22 ENCOUNTER — Encounter: Payer: Self-pay | Admitting: *Deleted

## 2023-06-22 NOTE — Patient Instructions (Signed)
[name] [name]  06/22/2023     @PREFPERIOPPHARMACY @   Your procedure is scheduled on  06/29/2023.   Report to [facility] at  1130 A.M.   Call this number if you have problems the morning of surgery:  [phone]  If you experience any cold or flu symptoms such as cough, fever, chills, shortness of breath, etc. between now and your scheduled surgery, please notify us at the above number.   Remember:  Follow the diet and prep instructions given to you by the office.       Your last dose of mounjaro should be on 06/21/2023.       Your last dose of eliquis should be on 06/26/2023.       Use your inhaler before you come and bring your rescue inhaler with you.      DO NOT take any medications for diabetes the morning of your procedure.     Take these medicines the morning of surgery with A SIP OF WATER             carvedilol,flexeril(if needed), gabapentin, imdur, omeprazole, zonisamide.     Do not wear jewelry, make-up or nail polish, including gel polish,  artificial nails, or any other type of covering on natural nails (fingers and  toes).  Do not wear lotions, powders, or perfumes, or deodorant.  Do not shave 48 hours prior to surgery.  Men may shave face and neck.  Do not bring valuables to the hospital.  [facility] is not responsible for any belongings or valuables.  Contacts, dentures or bridgework may not be worn into surgery.  Leave your suitcase in the car.  After surgery it may be brought to your room.  For patients admitted to the hospital, discharge time will be determined by your treatment team.  Patients discharged the day of surgery will not be allowed to drive home and must have someone with them for 24 hours.    Special instructions:   DO NOT smoke tobacco or vape for 24 hours before your procedure.  Please read over the following fact sheets that you were given. Anesthesia Post-op Instructions and Care and Recovery After  Surgery      Upper Endoscopy, Adult, Care After After the procedure, it is common to have a sore throat. It is also common to have: Mild stomach pain or discomfort. Bloating. Nausea. Follow these instructions at home: The instructions below may help you care for yourself at home. Your health care provider may give you more instructions. If you have questions, ask your health care provider. If you were given a sedative during the procedure, it can affect you for several hours. Do not drive or operate machinery until your health care provider says that it is safe. If you will be going home right after the procedure, plan to have a responsible adult: Take you home from the hospital or clinic. You will not be allowed to drive. Care for you for the time you are told. Follow instructions from your health care provider about what you may eat and drink. Return to your normal activities as told by your health care provider. Ask your health care provider what activities are safe for you. Take over-the-counter and prescription medicines only as told by your health care provider. Contact a health care provider if you: Have a sore throat that lasts longer than one day. Have trouble swallowing. Have a fever. Get  help right away if you: Vomit blood or your vomit looks like coffee grounds. Have bloody, black, or tarry stools. Have a very bad sore throat or you cannot swallow. Have difficulty breathing or very bad pain in your chest or abdomen. These symptoms may be an emergency. Get help right away. Call 911. Do not wait to see if the symptoms will go away. Do not drive yourself to the hospital. Summary After the procedure, it is common to have a sore throat, mild stomach discomfort, bloating, and nausea. If you were given a sedative during the procedure, it can affect you for several hours. Do not drive until your health care provider says that it is safe. Follow instructions from your health care  provider about what you may eat and drink. Return to your normal activities as told by your health care provider. This information is not intended to replace advice given to you by your health care provider. Make sure you discuss any questions you have with your health care provider. Document Revised: 02/04/2022 Document Reviewed: 02/04/2022 Elsevier Patient Education  2024 [employer] Esophageal Dilatation Esophageal dilatation, also called esophageal dilation, is a procedure to widen or open a blocked or narrowed part of the esophagus. The esophagus is the part of the body that moves food and liquid from the mouth to the stomach. You may need this procedure if: You have a buildup of scar tissue in your esophagus that makes it difficult, painful, or impossible to swallow. This can be caused by gastroesophageal reflux disease (GERD). You have cancer of the esophagus. There is a problem with how food moves through your esophagus. In some cases, you may need this procedure repeated at a later time to dilate the esophagus gradually. Tell a health care provider about: Any allergies you have. All medicines you are taking, including vitamins, herbs, eye drops, creams, and over-the-counter medicines. Any problems you or family members have had with anesthetic medicines. Any blood disorders you have. Any surgeries you have had. Any medical conditions you have. Any antibiotic medicines you are required to take before dental procedures. Whether you are pregnant or may be pregnant. What are the risks? Generally, this is a safe procedure. However, problems may occur, including: Bleeding due to a tear in the lining of the esophagus. A hole, or perforation, in the esophagus. What happens before the procedure? Ask your health care provider about: Changing or stopping your regular medicines. This is especially important if you are taking diabetes medicines or blood thinners. Taking medicines such as  aspirin and ibuprofen. These medicines can thin your blood. Do not take these medicines unless your health care provider tells you to take them. Taking over-the-counter medicines, vitamins, herbs, and supplements. Follow instructions from your health care provider about eating or drinking restrictions. Plan to have a responsible adult take you home from the hospital or clinic. Plan to have a responsible adult care for you for the time you are told after you leave the hospital or clinic. This is important. What happens during the procedure? You may be given a medicine to help you relax (sedative). A numbing medicine may be sprayed into the back of your throat, or you may gargle the medicine. Your health care provider may perform the dilatation using various surgical instruments, such as: Simple dilators. This instrument is carefully placed in the esophagus to stretch it. Guided wire bougies. This involves using an endoscope to insert a wire into the esophagus. A dilator is passed over this  wire to enlarge the esophagus. Then the wire is removed. Balloon dilators. An endoscope with a small balloon is inserted into the esophagus. The balloon is inflated to stretch the esophagus and open it up. The procedure may vary among health care providers and hospitals. What can I expect after the procedure? Your blood pressure, heart rate, breathing rate, and blood oxygen level will be monitored until you leave the hospital or clinic. Your throat may feel slightly sore and numb. This will get better over time. You will not be allowed to eat or drink until your throat is no longer numb. When you are able to drink, urinate, and sit on the edge of the bed without nausea or dizziness, you may be able to return home. Follow these instructions at home: Take over-the-counter and prescription medicines only as told by your health care provider. If you were given a sedative during the procedure, it can affect you for  several hours. Do not drive or operate machinery until your health care provider says that it is safe. Plan to have a responsible adult care for you for the time you are told. This is important. Follow instructions from your health care provider about any eating or drinking restrictions. Do not use any products that contain nicotine or tobacco, such as cigarettes, e-cigarettes, and chewing tobacco. If you need help quitting, ask your health care provider. Keep all follow-up visits. This is important. Contact a health care provider if: You have a fever. You have pain that is not relieved by medicine. Get help right away if: You have chest pain. You have trouble breathing. You have trouble swallowing. You vomit blood. You have black, tarry, or bloody stools. These symptoms may represent a serious problem that is an emergency. Do not wait to see if the symptoms will go away. Get medical help right away. Call your local emergency services (911 in the [place] Do not drive yourself to the hospital. Summary Esophageal dilatation, also called esophageal dilation, is a procedure to widen or open a blocked or narrowed part of the esophagus. Plan to have a responsible adult take you home from the hospital or clinic. For this procedure, a numbing medicine may be sprayed into the back of your throat, or you may gargle the medicine. Do not drive or operate machinery until your health care provider says that it is safe. This information is not intended to replace advice given to you by your health care provider. Make sure you discuss any questions you have with your health care provider. Document Revised: 03/13/2020 Document Reviewed: 03/13/2020 Elsevier Patient Education  2024 Else[employer] Inc. Colonoscopy, Adult, Care After The following information offers guidance on how to care for yourself after your procedure. Your health care provider may also give you more specific instructions. If you have problems or  questions, contact your health care provider. What can I expect after the procedure? After the procedure, it is common to have: A small amount of blood in your stool for 24 hours after the procedure. Some gas. Mild cramping or bloating of your abdomen. Follow these instructions at home: Eating and drinking  Drink enough fluid to keep your urine pale yellow. Follow instructions from your health care provider about eating or drinking restrictions. Resume your normal diet as told by your health care provider. Avoid heavy or fried foods that are hard to digest. Activity Rest as told by your health care provider. Avoid sitting for a long time without moving. Get up to take  short walks every 1-2 hours. This is important to improve blood flow and breathing. Ask for help if you feel weak or unsteady. Return to your normal activities as told by your health care provider. Ask your health care provider what activities are safe for you. Managing cramping and bloating  Try walking around when you have cramps or feel bloated. If directed, apply heat to your abdomen as told by your health care provider. Use the heat source that your health care provider recommends, such as a moist heat pack or a heating pad. Place a towel between your skin and the heat source. Leave the heat on for 20-30 minutes. Remove the heat if your skin turns bright red. This is especially important if you are unable to feel pain, heat, or cold. You have a greater risk of getting burned. General instructions If you were given a sedative during the procedure, it can affect you for several hours. Do not drive or operate machinery until your health care provider says that it is safe. For the first 24 hours after the procedure: Do not sign important documents. Do not drink alcohol. Do your regular daily activities at a slower pace than normal. Eat soft foods that are easy to digest. Take over-the-counter and prescription medicines  only as told by your health care provider. Keep all follow-up visits. This is important. Contact a health care provider if: You have blood in your stool 2-3 days after the procedure. Get help right away if: You have more than a small spotting of blood in your stool. You have large blood clots in your stool. You have swelling of your abdomen. You have nausea or vomiting. You have a fever. You have increasing pain in your abdomen that is not relieved with medicine. These symptoms may be an emergency. Get help right away. Call 911. Do not wait to see if the symptoms will go away. Do not drive yourself to the hospital. Summary After the procedure, it is common to have a small amount of blood in your stool. You may also have mild cramping and bloating of your abdomen. If you were given a sedative during the procedure, it can affect you for several hours. Do not drive or operate machinery until your health care provider says that it is safe. Get help right away if you have a lot of blood in your stool, nausea or vomiting, a fever, or increased pain in your abdomen. This information is not intended to replace advice given to you by your health care provider. Make sure you discuss any questions you have with your health care provider. Document Revised: 12/08/2022 Document Reviewed: 06/18/2021 Elsevier Patient Education  2024 Elsevier Inc. Monitored Anesthesia Care, Care After The following information offers guidance on how to care for yourself after your procedure. Your health care provider may also give you more specific instructions. If you have problems or questions, contact your health care provider. What can I expect after the procedure? After the procedure, it is common to have: Tiredness. Little or no memory about what happened during or after the procedure. Impaired judgment when it comes to making decisions. Nausea or vomiting. Some trouble with balance. Follow these instructions at  home: For the time period you were told by your health care provider:  Rest. Do not participate in activities where you could fall or become injured. Do not drive or use machinery. Do not drink alcohol. Do not take sleeping pills or medicines that cause drowsiness. Do not make  important decisions or sign legal documents. Do not take care of children on your own. Medicines Take over-the-counter and prescription medicines only as told by your health care provider. If you were prescribed antibiotics, take them as told by your health care provider. Do not stop using the antibiotic even if you start to feel better. Eating and drinking Follow instructions from your health care provider about what you may eat and drink. Drink enough fluid to keep your urine pale yellow. If you vomit: Drink clear fluids slowly and in small amounts as you are able. Clear fluids include water, ice chips, low-calorie sports drinks, and fruit juice that has water added to it (diluted fruit juice). Eat light and bland foods in small amounts as you are able. These foods include bananas, applesauce, rice, lean meats, toast, and crackers. General instructions  Have a responsible adult stay with you for the time you are told. It is important to have someone help care for you until you are awake and alert. If you have sleep apnea, surgery and some medicines can increase your risk for breathing problems. Follow instructions from your health care provider about wearing your sleep device: When you are sleeping. This includes during daytime naps. While taking prescription pain medicines, sleeping medicines, or medicines that make you drowsy. Do not use any products that contain nicotine or tobacco. These products include cigarettes, chewing tobacco, and vaping devices, such as e-cigarettes. If you need help quitting, ask your health care provider. Contact a health care provider if: You feel nauseous or vomit every time you eat  or drink. You feel light-headed. You are still sleepy or having trouble with balance after 24 hours. You get a rash. You have a fever. You have redness or swelling around the IV site. Get help right away if: You have trouble breathing. You have new confusion after you get home. These symptoms may be an emergency. Get help right away. Call 911. Do not wait to see if the symptoms will go away. Do not drive yourself to the hospital. This information is not intended to replace advice given to you by your health care provider. Make sure you discuss any questions you have with your health care provider. Document Revised: 03/23/2022 Document Reviewed: 03/23/2022 Elsevier Patient Education  2024 [employer].

## 2023-06-23 ENCOUNTER — Encounter (HOSPITAL_COMMUNITY): Payer: Self-pay

## 2023-06-23 ENCOUNTER — Encounter (HOSPITAL_COMMUNITY): Admission: RE | Admit: 2023-06-23 | Payer: 59 | Source: Ambulatory Visit

## 2023-06-23 ENCOUNTER — Encounter (HOSPITAL_COMMUNITY)
Admission: RE | Admit: 2023-06-23 | Discharge: 2023-06-23 | Disposition: A | Discharge status: Home / Self Care | Payer: 59 | Source: Ambulatory Visit | Attending: Gastroenterology | Admitting: Gastroenterology

## 2023-06-23 ENCOUNTER — Other Ambulatory Visit: Payer: Self-pay

## 2023-06-23 VITALS — BP 145/84 | HR 70 | Temp 98.7°F | Resp 18 | Ht 69.0 in | Wt 189.1 lb

## 2023-06-23 DIAGNOSIS — Z01818 Encounter for other preprocedural examination: Secondary | ICD-10-CM

## 2023-06-23 DIAGNOSIS — Z9189 Other specified personal risk factors, not elsewhere classified: Secondary | ICD-10-CM | POA: Insufficient documentation

## 2023-06-23 DIAGNOSIS — Z01812 Encounter for preprocedural laboratory examination: Secondary | ICD-10-CM | POA: Diagnosis not present

## 2023-06-23 LAB — COMPREHENSIVE METABOLIC PANEL
ALT: 31 U/L (ref 0–44)
AST: 26 U/L (ref 15–41)
Albumin: 3.4 g/dL — ABNORMAL LOW (ref 3.5–5.0)
Alkaline Phosphatase: 51 U/L (ref 38–126)
Anion gap: 8 (ref 5–15)
BUN: 15 mg/dL (ref 8–23)
CO2: 24 mmol/L (ref 22–32)
Calcium: 9 mg/dL (ref 8.9–10.3)
Chloride: 105 mmol/L (ref 98–111)
Creatinine, Ser: 1.04 mg/dL (ref 0.61–1.24)
GFR, Estimated: >60 mL/min (ref >60)
Glucose, Bld: 128 mg/dL — ABNORMAL HIGH (ref 70–99)
Potassium: 4.1 mmol/L (ref 3.5–5.1)
Sodium: 137 mmol/L (ref 135–145)
Total Bilirubin: 0.5 mg/dL (ref 0.3–1.2)
Total Protein: 6.7 g/dL (ref 6.5–8.1)

## 2023-06-23 LAB — CBC
HCT: 42.8 % (ref 39.0–52.0)
Hemoglobin: 14.6 g/dL (ref 13.0–17.0)
MCH: 30.9 pg (ref 26.0–34.0)
MCHC: 34.1 g/dL (ref 30.0–36.0)
MCV: 90.7 fL (ref 80.0–100.0)
Platelets: 60 10*3/uL — ABNORMAL LOW (ref 150–400)
RBC: 4.72 MIL/uL (ref 4.22–5.81)
RDW: 13.2 % (ref 11.5–15.5)
WBC: 1.8 10*3/uL — ABNORMAL LOW (ref 4.0–10.5)
nRBC: 0 % (ref 0.0–0.2)

## 2023-06-23 LAB — PROTIME-INR
INR: 1.3 — ABNORMAL HIGH (ref 0.8–1.2)
Prothrombin Time: 16 seconds — ABNORMAL HIGH (ref 11.4–15.2)

## 2023-06-28 NOTE — Progress Notes (Signed)
Patient needs PT/INR checked as he may undergo variceal ligation and polypectomy. PT/INR will help evaluating his bleeding risk

## 2023-06-29 ENCOUNTER — Encounter (HOSPITAL_COMMUNITY): Payer: Self-pay | Admitting: Gastroenterology

## 2023-06-29 ENCOUNTER — Ambulatory Visit (HOSPITAL_COMMUNITY)
Admission: RE | Admit: 2023-06-29 | Discharge: 2023-06-29 | Disposition: A | Discharge status: Home / Self Care | Payer: 59 | Attending: Gastroenterology | Admitting: Gastroenterology

## 2023-06-29 ENCOUNTER — Encounter (HOSPITAL_COMMUNITY)
Admission: RE | Disposition: A | Discharge status: Home / Self Care | Payer: Self-pay | Source: Home / Self Care | Attending: Gastroenterology

## 2023-06-29 ENCOUNTER — Ambulatory Visit (HOSPITAL_COMMUNITY): Payer: 59 | Admitting: Anesthesiology

## 2023-06-29 ENCOUNTER — Ambulatory Visit (HOSPITAL_BASED_OUTPATIENT_CLINIC_OR_DEPARTMENT_OTHER): Payer: 59 | Admitting: Anesthesiology

## 2023-06-29 DIAGNOSIS — K648 Other hemorrhoids: Secondary | ICD-10-CM | POA: Insufficient documentation

## 2023-06-29 DIAGNOSIS — I4891 Unspecified atrial fibrillation: Secondary | ICD-10-CM | POA: Insufficient documentation

## 2023-06-29 DIAGNOSIS — I2510 Atherosclerotic heart disease of native coronary artery without angina pectoris: Secondary | ICD-10-CM | POA: Insufficient documentation

## 2023-06-29 DIAGNOSIS — K222 Esophageal obstruction: Secondary | ICD-10-CM | POA: Insufficient documentation

## 2023-06-29 DIAGNOSIS — I509 Heart failure, unspecified: Secondary | ICD-10-CM | POA: Diagnosis not present

## 2023-06-29 DIAGNOSIS — F419 Anxiety disorder, unspecified: Secondary | ICD-10-CM | POA: Diagnosis not present

## 2023-06-29 DIAGNOSIS — G4733 Obstructive sleep apnea (adult) (pediatric): Secondary | ICD-10-CM | POA: Insufficient documentation

## 2023-06-29 DIAGNOSIS — Z7901 Long term (current) use of anticoagulants: Secondary | ICD-10-CM | POA: Insufficient documentation

## 2023-06-29 DIAGNOSIS — I5032 Chronic diastolic (congestive) heart failure: Secondary | ICD-10-CM | POA: Diagnosis not present

## 2023-06-29 DIAGNOSIS — I252 Old myocardial infarction: Secondary | ICD-10-CM | POA: Insufficient documentation

## 2023-06-29 DIAGNOSIS — Z951 Presence of aortocoronary bypass graft: Secondary | ICD-10-CM | POA: Insufficient documentation

## 2023-06-29 DIAGNOSIS — K3189 Other diseases of stomach and duodenum: Secondary | ICD-10-CM | POA: Diagnosis not present

## 2023-06-29 DIAGNOSIS — K7581 Nonalcoholic steatohepatitis (NASH): Secondary | ICD-10-CM | POA: Insufficient documentation

## 2023-06-29 DIAGNOSIS — K766 Portal hypertension: Secondary | ICD-10-CM | POA: Insufficient documentation

## 2023-06-29 DIAGNOSIS — Z8673 Personal history of transient ischemic attack (TIA), and cerebral infarction without residual deficits: Secondary | ICD-10-CM | POA: Insufficient documentation

## 2023-06-29 DIAGNOSIS — K7460 Unspecified cirrhosis of liver: Secondary | ICD-10-CM | POA: Insufficient documentation

## 2023-06-29 DIAGNOSIS — K219 Gastro-esophageal reflux disease without esophagitis: Secondary | ICD-10-CM | POA: Diagnosis not present

## 2023-06-29 DIAGNOSIS — R195 Other fecal abnormalities: Secondary | ICD-10-CM | POA: Insufficient documentation

## 2023-06-29 DIAGNOSIS — I8510 Secondary esophageal varices without bleeding: Secondary | ICD-10-CM | POA: Diagnosis not present

## 2023-06-29 DIAGNOSIS — I110 Hypertensive heart disease with heart failure: Secondary | ICD-10-CM | POA: Insufficient documentation

## 2023-06-29 DIAGNOSIS — J45909 Unspecified asthma, uncomplicated: Secondary | ICD-10-CM | POA: Insufficient documentation

## 2023-06-29 DIAGNOSIS — R1310 Dysphagia, unspecified: Secondary | ICD-10-CM | POA: Insufficient documentation

## 2023-06-29 DIAGNOSIS — K449 Diaphragmatic hernia without obstruction or gangrene: Secondary | ICD-10-CM | POA: Diagnosis not present

## 2023-06-29 DIAGNOSIS — I8500 Esophageal varices without bleeding: Secondary | ICD-10-CM | POA: Diagnosis not present

## 2023-06-29 DIAGNOSIS — R194 Change in bowel habit: Secondary | ICD-10-CM

## 2023-06-29 DIAGNOSIS — Z79899 Other long term (current) drug therapy: Secondary | ICD-10-CM | POA: Diagnosis not present

## 2023-06-29 DIAGNOSIS — E7800 Pure hypercholesterolemia, unspecified: Secondary | ICD-10-CM | POA: Insufficient documentation

## 2023-06-29 HISTORY — PX: ESOPHAGOGASTRODUODENOSCOPY (EGD) WITH PROPOFOL: SHX5813

## 2023-06-29 HISTORY — PX: COLONOSCOPY WITH PROPOFOL: SHX5780

## 2023-06-29 LAB — HM COLONOSCOPY

## 2023-06-29 LAB — GLUCOSE, CAPILLARY: Glucose-Capillary: 84 mg/dL (ref 70–99)

## 2023-06-29 SURGERY — COLONOSCOPY WITH PROPOFOL
Anesthesia: General

## 2023-06-29 MED ORDER — CARVEDILOL 6.25 MG PO TABS: 6.2500 mg | ORAL_TABLET | Freq: Two times a day (BID) | ORAL | 3 refills | Status: DC

## 2023-06-29 MED ORDER — LIDOCAINE HCL (CARDIAC) PF 50 MG/5ML IV SOSY
PREFILLED_SYRINGE | INTRAVENOUS | Status: DC | PRN
  Administered 2023-06-29: 50 mg via INTRAVENOUS

## 2023-06-29 MED ORDER — LACTATED RINGERS IV SOLN: INTRAVENOUS | Status: DC

## 2023-06-29 MED ORDER — LACTATED RINGERS IV SOLN: INTRAVENOUS | Status: DC | PRN

## 2023-06-29 MED ORDER — PROPOFOL 500 MG/50ML IV EMUL
INTRAVENOUS | Status: DC | PRN
  Administered 2023-06-29: 150 ug/kg/min via INTRAVENOUS

## 2023-06-29 MED ORDER — CHLORHEXIDINE GLUCONATE CLOTH 2 % EX PADS: 6.0000 | MEDICATED_PAD | Freq: Once | CUTANEOUS | Status: DC

## 2023-06-29 MED ORDER — PROPOFOL 10 MG/ML IV BOLUS
INTRAVENOUS | Status: DC | PRN
Start: 2023-06-29 — End: 2023-06-29
  Administered 2023-06-29: 50 mg via INTRAVENOUS
  Administered 2023-06-29: 100 mg via INTRAVENOUS
  Administered 2023-06-29 (×4): 50 mg via INTRAVENOUS

## 2023-06-29 MED ORDER — STERILE WATER FOR IRRIGATION IR SOLN
Status: DC | PRN
  Administered 2023-06-29: 100 mL

## 2023-06-29 NOTE — Interval H&P Note (Signed)
History and Physical Interval Note:  06/29/2023 12:50 PM  [name] [name]  has presented today for surgery, with the diagnosis of DYSPHAGIA,CHANGE IN STOOL CALIBER.  The various methods of treatment have been discussed with the patient and family. After consideration of risks, benefits and other options for treatment, the patient has consented to  Procedure(s) with comments: COLONOSCOPY WITH PROPOFOL (N/A) - 1:30PM;ASA 3 ESOPHAGOGASTRODUODENOSCOPY (EGD) WITH PROPOFOL (N/A) - 1:30PM;ASA 3 ESOPHAGEAL DILATION (N/A) - 1:30PM;ASA 3 as a surgical intervention.  The patient's history has been reviewed, patient examined, no change in status, stable for surgery.  I have reviewed the patient's chart and labs.  Questions were answered to the patient's satisfaction.     Katrinka Blazing Mayorga

## 2023-06-29 NOTE — Anesthesia Preprocedure Evaluation (Signed)
Anesthesia Evaluation  Patient identified by MRN, date of birth, ID band Patient awake    Reviewed: Allergy & Precautions, H&P , NPO status , Patient's Chart, lab work & pertinent test results, reviewed documented beta blocker date and time   Airway Mallampati: II  TM Distance: >3 FB Neck ROM: full    Dental no notable dental hx. (+) Edentulous Upper, Edentulous Lower   Pulmonary neg pulmonary ROS, asthma , sleep apnea , pneumonia   Pulmonary exam normal breath sounds clear to auscultation       Cardiovascular Exercise Tolerance: Good hypertension, + angina  + CAD, + Past MI, + CABG and +CHF  + dysrhythmias Atrial Fibrillation  Rhythm:regular Rate:Normal     Neuro/Psych  Headaches  Anxiety     TIA Neuromuscular disease CVA negative neurological ROS  negative psych ROS   GI/Hepatic negative GI ROS, Neg liver ROS, hiatal hernia,GERD  ,,  Endo/Other  negative endocrine ROSdiabetes    Renal/GU Renal diseasenegative Renal ROS  negative genitourinary   Musculoskeletal   Abdominal   Peds  Hematology negative hematology ROS (+) Blood dyscrasia, anemia   Anesthesia Other Findings   Reproductive/Obstetrics negative OB ROS                             Anesthesia Physical Anesthesia Plan  ASA: 3  Anesthesia Plan: General   Post-op Pain Management:    Induction:   PONV Risk Score and Plan: Propofol infusion  Airway Management Planned:   Additional Equipment:   Intra-op Plan:   Post-operative Plan:   Informed Consent: I have reviewed the patients History and Physical, chart, labs and discussed the procedure including the risks, benefits and alternatives for the proposed anesthesia with the patient or authorized representative who has indicated his/her understanding and acceptance.     Dental Advisory Given  Plan Discussed with: CRNA  Anesthesia Plan Comments:        Anesthesia  Quick Evaluation

## 2023-06-29 NOTE — Op Note (Signed)
[facility] Patient Name: [name] [name] Procedure Date: 06/29/2023 1:11 PM MRN: [id number] Date of Birth: 11-Feb-1956 Attending MD: Katrinka Blazing , , [id number] CSN: [id number] Age: 67 Admit Type: Outpatient Procedure:                Colonoscopy Indications:              Change in stool caliber Providers:                Katrinka Blazing, Angelica Ran, Elinor Parkinson,                            Dyann Ruddle Referring MD:              Medicines:                Monitored Anesthesia Care Complications:            No immediate complications. Estimated Blood Loss:     Estimated blood loss: none. Procedure:                Pre-Anesthesia Assessment:                           - Prior to the procedure, a History and Physical                            was performed, and patient medications, allergies                            and sensitivities were reviewed. The patient's                            tolerance of previous anesthesia was reviewed.                           - The risks and benefits of the procedure and the                            sedation options and risks were discussed with the                            patient. All questions were answered and informed                            consent was obtained.                           - ASA Grade Assessment: III - A patient with severe                            systemic disease.                           After obtaining informed consent, the colonoscope                            was passed under direct vision. Throughout the  procedure, the patient's blood pressure, pulse, and                            oxygen saturations were monitored continuously. The                            [id number]-[id number] ([id number]) scope was introduced through                            the anus and advanced to the the cecum, identified                            by appendiceal orifice and ileocecal valve. The                             colonoscopy was performed without difficulty. The                            patient tolerated the procedure well. The quality                            of the bowel preparation was excellent. Scope In: 1:57:49 PM Scope Out: 2:12:54 PM Scope Withdrawal Time: 0 hours 11 minutes 1 second  Total Procedure Duration: 0 hours 15 minutes 5 seconds  Findings:      The perianal and digital rectal examinations were normal.      The entire examined colon appeared normal.      Non-bleeding internal hemorrhoids were found during retroflexion. The       hemorrhoids were small. Impression:               - The entire examined colon is normal.                           - Non-bleeding internal hemorrhoids.                           - No specimens collected. Moderate Sedation:      Per Anesthesia Care Recommendation:           - Discharge patient to home (ambulatory).                           - Resume previous diet.                           - Repeat colonoscopy in 10 years for screening                            purposes.                           - Start taking Benefiber 1 tablespoon daily to                            increase the bulk of the stool. Procedure Code(s):        --- Professional ---  11914, Colonoscopy, flexible; diagnostic, including                            collection of specimen(s) by brushing or washing,                            when performed (separate procedure) Diagnosis Code(s):        --- Professional ---                           K64.8, Other hemorrhoids                           R19.5, Other fecal abnormalities CPT copyright 2022 American Medical Association. All rights reserved. The codes documented in this report are preliminary and upon coder review may  be revised to meet current compliance requirements. Katrinka Blazing, MD Katrinka Blazing,  06/29/2023 2:18:24 PM This report has been signed electronically. Number of Addenda:  0

## 2023-06-29 NOTE — Discharge Instructions (Addendum)
You are being discharged to home.  Resume your previous diet.  Increase carvedilol to 6.25 mg twice a day. Your physician has recommended a repeat colonoscopy in 10 years for screening purposes.  Start Benefiber fiber supplements daily to increase stool bulk  Restart Eliquis tonight.

## 2023-06-29 NOTE — Op Note (Addendum)
[facility] Patient Name: [name] [name] Procedure Date: 06/29/2023 1:21 PM MRN: [id number] Date of Birth: 1956/06/04 Attending MD: Katrinka Blazing , , [id number] CSN: [id number] Age: 67 Admit Type: Outpatient Procedure:                Upper GI endoscopy Indications:              Dysphagia Providers:                Katrinka Blazing, Angelica Ran, Elinor Parkinson,                            Dyann Ruddle Referring MD:              Medicines:                Monitored Anesthesia Care Complications:            No immediate complications. Estimated Blood Loss:     Estimated blood loss: none. Procedure:                Pre-Anesthesia Assessment:                           - Prior to the procedure, a History and Physical                            was performed, and patient medications, allergies                            and sensitivities were reviewed. The patient's                            tolerance of previous anesthesia was reviewed.                           - The risks and benefits of the procedure and the                            sedation options and risks were discussed with the                            patient. All questions were answered and informed                            consent was obtained.                           - ASA Grade Assessment: III - A patient with severe                            systemic disease.                           After obtaining informed consent, the endoscope was                            passed under direct vision. Throughout the  procedure, the patient's blood pressure, pulse, and                            oxygen saturations were monitored continuously. The                            [id number]-H190 ([id number]) scope was introduced through the                            mouth, and advanced to the second part of duodenum.                            The upper GI endoscopy was accomplished without                             difficulty. The patient tolerated the procedure                            well. Scope In: 1:44:39 PM Scope Out: 1:51:16 PM Total Procedure Duration: 0 hours 6 minutes 37 seconds  Findings:      Grade II varices were found in the lower third of the esophagus.      A non-obstructing hemicircumferential Schatzki ring was found at the       gastroesophageal junction. A cold forceps was used to disrupt the rim of       the ring - only one side biopsied x1.      The stomach was normal.      The examined duodenum was normal. Impression:               - Grade II esophageal varices.                           - Non-obstructing Schatzki ring.                           - Normal stomach.                           - Normal examined duodenum.                           - No specimens collected. Moderate Sedation:      Per Anesthesia Care Recommendation:           - Discharge patient to home (ambulatory).                           - Resume previous diet.                           - Increase carvedilol to 6.25 mg twice a day. Procedure Code(s):        --- Professional ---                           [phone], Esophagogastroduodenoscopy, flexible,  transoral; diagnostic, including collection of                            specimen(s) by brushing or washing, when performed                            (separate procedure) Diagnosis Code(s):        --- Professional ---                           I85.00, Esophageal varices without bleeding                           K22.2, Esophageal obstruction                           R13.10, Dysphagia, unspecified CPT copyright 2022 American Medical Association. All rights reserved. The codes documented in this report are preliminary and upon coder review may  be revised to meet current compliance requirements. Katrinka Blazing, MD Katrinka Blazing,  06/29/2023 1:57:11 PM This report has been signed electronically. Number of Addenda: 0

## 2023-06-29 NOTE — Transfer of Care (Signed)
Immediate Anesthesia Transfer of Care Note  Patient: [name] [name]  Procedure(s) Performed: COLONOSCOPY WITH PROPOFOL ESOPHAGOGASTRODUODENOSCOPY (EGD) WITH PROPOFOL  Patient Location: Short Stay  Anesthesia Type:General  Level of Consciousness: awake  Airway & Oxygen Therapy: Patient Spontanous Breathing  Post-op Assessment: Report given to RN  Post vital signs: Reviewed and stable  Last Vitals:  Vitals Value Taken Time  BP    Temp    Pulse    Resp    SpO2      Last Pain:  Vitals:   06/29/23 1337  TempSrc:   PainSc: 0-No pain      Patients Stated Pain Goal: 5 (06/29/23 1138)  Complications: No notable events documented.

## 2023-06-29 NOTE — Anesthesia Postprocedure Evaluation (Signed)
Anesthesia Post Note  Patient: [name] [name]  Procedure(s) Performed: COLONOSCOPY WITH PROPOFOL ESOPHAGOGASTRODUODENOSCOPY (EGD) WITH PROPOFOL  Patient location during evaluation: Short Stay Anesthesia Type: General Level of consciousness: awake and alert Pain management: pain level controlled Vital Signs Assessment: post-procedure vital signs reviewed and stable Respiratory status: spontaneous breathing Cardiovascular status: stable Postop Assessment: no apparent nausea or vomiting Anesthetic complications: no   No notable events documented.   Last Vitals:  Vitals:   06/29/23 1249 06/29/23 1420  BP:  (!) 103/52  Pulse: 78 68  Resp: (!) 0 16  Temp:  (!) 36.2 C  SpO2: 100% 97%    Last Pain:  Vitals:   06/29/23 1420  TempSrc: Axillary  PainSc: 0-No pain                 Ellyana Crigler

## 2023-06-30 ENCOUNTER — Ambulatory Visit: Payer: 59

## 2023-06-30 ENCOUNTER — Encounter (INDEPENDENT_AMBULATORY_CARE_PROVIDER_SITE_OTHER): Payer: Self-pay | Admitting: *Deleted

## 2023-06-30 NOTE — Addendum Note (Signed)
Addendum  created 06/30/23 1329 by Moshe Salisbury, CRNA   Intraprocedure Staff edited

## 2023-07-01 ENCOUNTER — Encounter (HOSPITAL_COMMUNITY): Payer: Self-pay | Admitting: Gastroenterology

## 2023-07-09 ENCOUNTER — Telehealth: Payer: Self-pay | Admitting: Internal Medicine

## 2023-07-09 ENCOUNTER — Other Ambulatory Visit: Payer: Self-pay

## 2023-07-09 MED ORDER — CITALOPRAM HYDROBROMIDE 20 MG PO TABS: 20.0000 mg | ORAL_TABLET | Freq: Every day | ORAL | 0 refills | Status: DC

## 2023-07-09 MED ORDER — FLUTICASONE PROPIONATE 50 MCG/ACT NA SUSP: 2.0000 | Freq: Two times a day (BID) | NASAL | 3 refills | Status: DC

## 2023-07-09 MED ORDER — OMEPRAZOLE 40 MG PO CPDR: 40.0000 mg | DELAYED_RELEASE_CAPSULE | Freq: Every day | ORAL | 10 refills | Status: DC

## 2023-07-09 MED ORDER — PRAVASTATIN SODIUM 20 MG PO TABS: 20.0000 mg | ORAL_TABLET | Freq: Every day | ORAL | 1 refills | Status: DC

## 2023-07-09 NOTE — Telephone Encounter (Signed)
[name] [name] called [facility] pharmacy  [phone]  citalopram (CELEXA) 20 MG tablet [[id number]   Pravastatin  0 mg  Fluticasone-Salmeterol (ADVAIR) 250-50 MCG/DOSE AEPB [[id number]]   omeprazole (PRILOSEC) 40 MG capsule [[id number]   Pharmacy  [facility] - [address], [place] - [address], [place] [address] Phone: [phone]  Fax: [phone] DEA #: --

## 2023-07-09 NOTE — Telephone Encounter (Signed)
Refills sent

## 2023-07-13 ENCOUNTER — Ambulatory Visit: Payer: 59 | Admitting: Dermatology

## 2023-07-19 ENCOUNTER — Other Ambulatory Visit: Payer: Self-pay | Admitting: Internal Medicine

## 2023-07-19 DIAGNOSIS — Z794 Long term (current) use of insulin: Secondary | ICD-10-CM

## 2023-07-20 ENCOUNTER — Encounter: Payer: 59 | Admitting: Pulmonary Disease

## 2023-07-21 ENCOUNTER — Other Ambulatory Visit: Payer: Self-pay | Admitting: Internal Medicine

## 2023-07-21 DIAGNOSIS — Z794 Long term (current) use of insulin: Secondary | ICD-10-CM

## 2023-07-21 DIAGNOSIS — R809 Proteinuria, unspecified: Secondary | ICD-10-CM

## 2023-07-22 ENCOUNTER — Other Ambulatory Visit: Payer: Self-pay | Admitting: Internal Medicine

## 2023-07-22 ENCOUNTER — Ambulatory Visit (INDEPENDENT_AMBULATORY_CARE_PROVIDER_SITE_OTHER): Payer: 59

## 2023-07-22 DIAGNOSIS — E1165 Type 2 diabetes mellitus with hyperglycemia: Secondary | ICD-10-CM

## 2023-07-22 DIAGNOSIS — Z794 Long term (current) use of insulin: Secondary | ICD-10-CM | POA: Diagnosis not present

## 2023-07-22 DIAGNOSIS — E1100 Type 2 diabetes mellitus with hyperosmolarity without nonketotic hyperglycemic-hyperosmolar coma (NKHHC): Secondary | ICD-10-CM

## 2023-07-22 LAB — HM DIABETES EYE EXAM

## 2023-07-22 NOTE — Progress Notes (Signed)
[name] [name] arrived 07/22/2023 and has given verbal consent to obtain images and complete their overdue diabetic retinal screening.  The images have been sent to an ophthalmologist or optometrist for review and interpretation.  Results will be sent back to [name], MD for review.  Patient has been informed they will be contacted when we receive the results via telephone or MyChart

## 2023-08-02 ENCOUNTER — Other Ambulatory Visit: Payer: Self-pay

## 2023-08-02 ENCOUNTER — Telehealth: Payer: Self-pay | Admitting: Internal Medicine

## 2023-08-02 ENCOUNTER — Other Ambulatory Visit: Payer: 59

## 2023-08-02 DIAGNOSIS — Z794 Long term (current) use of insulin: Secondary | ICD-10-CM

## 2023-08-02 DIAGNOSIS — E1100 Type 2 diabetes mellitus with hyperosmolarity without nonketotic hyperglycemic-hyperosmolar coma (NKHHC): Secondary | ICD-10-CM

## 2023-08-02 MED ORDER — MOUNJARO 5 MG/0.5ML ~~LOC~~ SOAJ
5.0000 mg | SUBCUTANEOUS | 0 refills | Status: DC
Start: 2023-08-02 — End: 2023-12-29

## 2023-08-02 MED ORDER — OMEPRAZOLE 40 MG PO CPDR: 40.0000 mg | DELAYED_RELEASE_CAPSULE | Freq: Every day | ORAL | 10 refills | Status: DC

## 2023-08-02 NOTE — Telephone Encounter (Signed)
Refills resent to pharmacy

## 2023-08-02 NOTE — Telephone Encounter (Signed)
Patient called has been out for a whole month of these medicines  [name] 5 MG/0.5ML Pen [[id number]   omeprazole (PRILOSEC) 40 MG capsule [[id number]]   Pharmacy:  [facility]

## 2023-08-04 ENCOUNTER — Inpatient Hospital Stay: Payer: 59 | Attending: Hematology

## 2023-08-04 ENCOUNTER — Other Ambulatory Visit: Payer: Self-pay | Admitting: Internal Medicine

## 2023-08-04 ENCOUNTER — Other Ambulatory Visit: Payer: Self-pay | Admitting: *Deleted

## 2023-08-04 DIAGNOSIS — E1100 Type 2 diabetes mellitus with hyperosmolarity without nonketotic hyperglycemic-hyperosmolar coma (NKHHC): Secondary | ICD-10-CM

## 2023-08-04 DIAGNOSIS — I2692 Saddle embolus of pulmonary artery without acute cor pulmonale: Secondary | ICD-10-CM

## 2023-08-04 DIAGNOSIS — D696 Thrombocytopenia, unspecified: Secondary | ICD-10-CM

## 2023-08-04 DIAGNOSIS — E611 Iron deficiency: Secondary | ICD-10-CM

## 2023-08-04 DIAGNOSIS — Z86718 Personal history of other venous thrombosis and embolism: Secondary | ICD-10-CM | POA: Diagnosis not present

## 2023-08-04 DIAGNOSIS — Z86711 Personal history of pulmonary embolism: Secondary | ICD-10-CM | POA: Insufficient documentation

## 2023-08-04 DIAGNOSIS — E1129 Type 2 diabetes mellitus with other diabetic kidney complication: Secondary | ICD-10-CM

## 2023-08-04 LAB — CBC WITH DIFFERENTIAL/PLATELET
Abs Immature Granulocytes: 0.01 10*3/uL (ref 0.00–0.07)
Basophils Absolute: 0 10*3/uL (ref 0.0–0.1)
Basophils Relative: 0 %
Eosinophils Absolute: 0.1 10*3/uL (ref 0.0–0.5)
Eosinophils Relative: 2 %
HCT: 44.1 % (ref 39.0–52.0)
Hemoglobin: 15.1 g/dL (ref 13.0–17.0)
Immature Granulocytes: 0 %
Lymphocytes Relative: 34 %
Lymphs Abs: 0.9 10*3/uL (ref 0.7–4.0)
MCH: 31.5 pg (ref 26.0–34.0)
MCHC: 34.2 g/dL (ref 30.0–36.0)
MCV: 91.9 fL (ref 80.0–100.0)
Monocytes Absolute: 0.2 10*3/uL (ref 0.1–1.0)
Monocytes Relative: 8 %
Neutro Abs: 1.5 10*3/uL — ABNORMAL LOW (ref 1.7–7.7)
Neutrophils Relative %: 56 %
Platelets: 71 10*3/uL — ABNORMAL LOW (ref 150–400)
RBC: 4.8 MIL/uL (ref 4.22–5.81)
RDW: 14 % (ref 11.5–15.5)
WBC: 2.7 10*3/uL — ABNORMAL LOW (ref 4.0–10.5)
nRBC: 0 % (ref 0.0–0.2)

## 2023-08-04 LAB — VITAMIN B12: Vitamin B-12: 199 pg/mL (ref 180–914)

## 2023-08-04 LAB — FOLATE: Folate: 9.4 ng/mL (ref >5.9)

## 2023-08-04 MED ORDER — APIXABAN 5 MG PO TABS: 5.0000 mg | ORAL_TABLET | Freq: Two times a day (BID) | ORAL | 6 refills | Status: DC

## 2023-08-06 ENCOUNTER — Telehealth (INDEPENDENT_AMBULATORY_CARE_PROVIDER_SITE_OTHER): Payer: 59 | Admitting: Internal Medicine

## 2023-08-06 ENCOUNTER — Encounter: Payer: Self-pay | Admitting: Internal Medicine

## 2023-08-06 DIAGNOSIS — J069 Acute upper respiratory infection, unspecified: Secondary | ICD-10-CM | POA: Diagnosis not present

## 2023-08-06 NOTE — Assessment & Plan Note (Signed)
Evaluated today through video encounter endorsing 1 day history of the symptoms described above.  Symptoms are concerning for viral upper respiratory infection, possibly COVID, influenza, or RSV.  He has multiple recent sick contacts. -COVID/flu/RSV testing ordered today -Recommend continued supportive care measures including adequate hydration, rest, and as needed use of Tylenol and cough/cold medication.

## 2023-08-06 NOTE — Progress Notes (Signed)
Virtual Visit via Video Note  I connected with [name] [name] on 08/06/23 at  1:20 PM EDT by a video enabled telemedicine application and verified that I am speaking with the correct person using two identifiers.  Patient Location: Home Provider Location: Office/Clinic  I discussed the limitations, risks, security, and privacy concerns of performing an evaluation and management service by video and the availability of in person appointments. I also discussed with the patient that there may be a patient responsible charge related to this service. The patient expressed understanding and agreed to proceed.  Subjective: PCP: [name] Lade, MD  Chief Complaint  Patient presents with   Sore Throat    Patient has sore throat, chills, stuffy nose with mucus, ear pain for two days now   [name] has been evaluated today through video encounter for an acute visit endorsing a 1 day history of upper respiratory symptoms.  He reports onset of sinus congestion, cough with scant sputum production, bilateral ear discomfort, throat irritation, fatigue, subjective fever/chills, and diarrhea beginning last night.  He works as a Engineer, materials at [employer] and has been in touch with multiple sick contacts recently.  He also reports that his granddaughter has recently had RSV.  [name] has tried taking OTC cough/cold medication for symptom relief, which has been effective.  ROS: Per HPI  Current Outpatient Medications:    albuterol (VENTOLIN HFA) 108 (90 Base) MCG/ACT inhaler, Inhale 2 puffs into the lungs every 6 (six) hours as needed for wheezing or shortness of breath., Disp: , Rfl:    apixaban (ELIQUIS) 5 MG TABS tablet, Take 1 tablet (5 mg total) by mouth 2 (two) times daily., Disp: 60 tablet, Rfl: 6   carvedilol (COREG) 6.25 MG tablet, Take 1 tablet (6.25 mg total) by mouth 2 (two) times daily with a meal., Disp: 180 tablet, Rfl: 3   citalopram (CELEXA) 20 MG tablet, Take 1 tablet (20 mg  total) by mouth at bedtime., Disp: 90 tablet, Rfl: 0   Continuous Blood Gluc Receiver (FREESTYLE LIBRE 2 READER) DEVI, 1 Device by Does not apply route continuous., Disp: 1 each, Rfl: 0   Continuous Blood Gluc Sensor (FREESTYLE LIBRE 2 SENSOR) MISC, Apply one sensor every 14 days, Disp: 6 each, Rfl: 3   cyclobenzaprine (FLEXERIL) 10 MG tablet, TAKE 1 TABLET BY MOUTH THREE TIMES DAILY AS NEEDED FOR MUSCLE SPASMS, Disp: 30 tablet, Rfl: 10   ezetimibe (ZETIA) 10 MG tablet, TAKE 1 TABLET BY MOUTH ONCE DAILY, Disp: 30 tablet, Rfl: 10   FARXIGA 5 MG TABS tablet, TAKE 1 TABLET BY MOUTH DAILY BEFORE BREAKFAST, Disp: 30 tablet, Rfl: 10   fluticasone (FLONASE) 50 MCG/ACT nasal spray, Place 2 sprays into both nostrils 2 (two) times daily., Disp: 15.8 mL, Rfl: 3   fluticasone-salmeterol (ADVAIR) 250-50 MCG/ACT AEPB, INHALE 1 PUFF BY MOUTH TWICE DAILY, Disp: 60 each, Rfl: 10   gabapentin (NEURONTIN) 300 MG capsule, TAKE 1 CAPSULE BY MOUTH THREE TIMES DAILY, Disp: 90 capsule, Rfl: 10   isosorbide mononitrate (IMDUR) 60 MG 24 hr tablet, Take one (1) tablet by mouth ( 60 mg ) in the am and one half tablet ( 30 mg) in the pm., Disp: 90 tablet, Rfl: 1   Melatonin 10 MG TABS, Take 10 mg by mouth at bedtime., Disp: , Rfl:    nitroGLYCERIN (NITROSTAT) 0.4 MG SL tablet, DISSOLVE 1 TABLET UNDER THE TONGUE AS NEEDED FOR CHEST PAIN EVERY 5 MINUTES UP TO 3 TIMES. IF  NO RELIEF CALL 911., Disp: 25 tablet, Rfl: 10   omeprazole (PRILOSEC) 40 MG capsule, Take 1 capsule (40 mg total) by mouth daily., Disp: 30 capsule, Rfl: 10   pravastatin (PRAVACHOL) 20 MG tablet, Take 1 tablet (20 mg total) by mouth daily., Disp: 30 tablet, Rfl: 1   rosuvastatin (CRESTOR) 20 MG tablet, Take 1 tablet (20 mg total) by mouth daily., Disp: 30 tablet, Rfl: 2   tirzepatide ([name]) 5 MG/0.5ML Pen, Inject 5 mg into the skin once a week., Disp: 12 mL, Rfl: 0   zonisamide (ZONEGRAN) 100 MG capsule, Take 100 mg by mouth daily., Disp: , Rfl:    Assessment and Plan:  Viral URI with cough Assessment & Plan: Evaluated today through video encounter endorsing 1 day history of the symptoms described above.  Symptoms are concerning for viral upper respiratory infection, possibly COVID, influenza, or RSV.  He has multiple recent sick contacts. -COVID/flu/RSV testing ordered today -Recommend continued supportive care measures including adequate hydration, rest, and as needed use of Tylenol and cough/cold medication.  Follow Up Instructions: Return if symptoms worsen or fail to improve.   I discussed the assessment and treatment plan with the patient. The patient was provided an opportunity to ask questions, and all were answered. The patient agreed with the plan and demonstrated an understanding of the instructions.   The patient was advised to call back or seek an in-person evaluation if the symptoms worsen or if the condition fails to improve as anticipated.  The above assessment and management plan was discussed with the patient. The patient verbalized understanding of and has agreed to the management plan.   [name] Lade, MD

## 2023-08-06 NOTE — Addendum Note (Signed)
Addended by: Herbie Saxon on: 08/06/2023 01:52 PM   Modules accepted: Orders

## 2023-08-08 LAB — METHYLMALONIC ACID, SERUM: Methylmalonic Acid, Quantitative: 276 nmol/L (ref 0–378)

## 2023-08-09 ENCOUNTER — Ambulatory Visit: Payer: 59 | Admitting: Hematology

## 2023-08-10 LAB — COVID-19, FLU A+B AND RSV
Influenza A, NAA: NOT DETECTED
Influenza B, NAA: NOT DETECTED
RSV, NAA: NOT DETECTED
SARS-CoV-2, NAA: NOT DETECTED

## 2023-08-11 ENCOUNTER — Ambulatory Visit: Payer: 59 | Admitting: Student

## 2023-08-12 ENCOUNTER — Ambulatory Visit: Payer: 59 | Admitting: Internal Medicine

## 2023-08-12 ENCOUNTER — Inpatient Hospital Stay: Payer: 59 | Attending: Hematology | Admitting: Oncology

## 2023-08-12 NOTE — Progress Notes (Deleted)
[facility] [address], [place] [address]    Clinic Day:  08/12/2023  Referring physician: [name], MD  Patient Care Team: [name], MD as PCP - General (Internal Medicine) Jonelle Sidle, MD as PCP - Cardiology (Cardiology) Doreatha Massed, MD as Medical Oncologist (Hematology)   ASSESSMENT & PLAN:   Assessment: 1.  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 - Presented to the ER on 12/09/2022 with intermittent midsternal pain radiating to the arm.  Chest x-ray was normal. - Was diagnosed with COVID with home test positive around 12/21/2022 (symptoms fever 1 day and cough still lingering) - Presented to ER again on 12/22/2022 with same symptoms. - Third presentation to the ER on 12/26/2022 with same symptoms. - CT angio chest: Small segmental and subsegmental pulmonary embolus in the right middle lobe with no evidence of right heart strain.  Scattered groundglass opacities in the right lung and at the left lung base, likely infectious or inflammatory.  Hepatic cirrhosis and splenomegaly. - Ultrasound legs (12/27/2022): Left profundofemoral vein DVT.  No evidence of DVT in the right leg. - Chest pain completely resolved after Eliquis started. - 20 pound weight loss since on [name].  No fevers or night sweats. - Initially it was thought to be thrombosis related to COVID.  However patient had presentation to the ER with similar symptoms of midsternal pain radiating to the arm even prior to COVID infection on 12/09/2022.  Because of his sedentary job, it was classified as weakly provoked to unprovoked.   2.  Social/family history: - Lives at home with his wife.  He has been on disability for 19 years and recently started working as a Electrical engineer at [employer] for the last 1 year.  He is non-smoker.   Nonalcoholic. - No family history of thrombosis.  Mother had 1 miscarriage. - Paternal grandfather had cancer.    Plan: 1.  Unprovoked DVT/PE: - Hematology workup from 03/31/2023 revealed negative lupus anticoagulant, anticardiolipin antibodies and antibeta 2 glycoprotein antibodies.  D-dimer undetectable. - Left leg Doppler from 03/31/2023 which was negative for DVT with resolution of the clot. -No evidence of recurrence.  He denies any pain, swelling or erythema to bilateral lower extremities. -Previously Dr. Ellin Saba has recommended continuing Eliquis.  -Patient does have underlying thrombocytopenia without evidence of bleeding. -Will continue to monitor closely for risk/benefits of continuation of anticoagulant in the setting of thrombocytopenia.   2.  Mild leukopenia and moderate thrombocytopenia: - He has evidence of cirrhosis with splenomegaly on CT scan. - Most likely etiology of mild leukopenia and moderate thrombocytopenia likely from splenomegaly. -Lab work from 08/04/2023 shows white blood cell count 2.7, platelet count 71,000 and ANC 1.5.  Vitamin B12 199, folate 9.4 and MMA 276. -Recommend starting B12 supplements 1000 mcg daily.  Will recheck these labs in 4 months.    PLAN SUMMARY: >> Continue anticoagulation. >> Start vitamin B12 1000 mcg daily.  >> Recheck labs in 4 months with labs a few days before.      No orders of the defined types were placed in this encounter.    Mauro Kaufmann, NP   10/3/20247:58 AM  CHIEF COMPLAINT:   Diagnosis: DVT/PE    Cancer Staging  No matching staging information was found for the patient.    Prior Therapy: none  Current Therapy:  Eliquis   HISTORY OF PRESENT ILLNESS:   Oncology History   No history exists.     INTERVAL HISTORY:   [name] [name] is a 67 y.o. male presenting to clinic today for follow up of DVT/PE. He was last seen in clinic on 03/31/2023 by Dr. Ellin Saba.  In the interim, he had a colonoscopy due to  change in stool consistency on 06/29/2023 which showed nonbleeding internal hemorrhoids but otherwise normal.  He was evaluated by his PCP on 08/06/2023 for sore throat.  He was tested for COVID, flu and RSV which were all negative.  No recent hospitalizations, surgeries or changes in baseline health.  Today, he states that he is doing well overall. His appetite level is at 100%. His energy level is at 70%.  PAST MEDICAL HISTORY:   Past Medical History: Past Medical History:  Diagnosis Date   Anxiety    Aortic insufficiency    Arthritis    Asthma    Cataract    Right eye   Chronic lower back pain    Cirrhosis of liver (HCC)    Coronary atherosclerosis of native coronary artery    a. 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   LIMA to LAD, free RIMA to OM2, SVG to PDA, open SVG harvest from right thigh   Stroke (HCC)    Thrombocytopenia (HCC)    TIA (transient ischemic attack)    Type 2 diabetes mellitus (HCC)     Surgical History: Past Surgical History:  Procedure Laterality Date   ANTERIOR CERVICAL DECOMP/DISCECTOMY FUSION  1998   "C3-4"   CARDIAC CATHETERIZATION  "several"   CARDIAC CATHETERIZATION N/A 12/30/2015   Procedure: Left Heart Cath and Coronary Angiography;  Surgeon: Runell Gess, MD;  [facility]: [facility];  Service: Cardiovascular;  Laterality: N/A;   CARPAL TUNNEL RELEASE Bilateral 2005   CHOLECYSTECTOMY N/A 11/28/2018   Procedure: LAPAROSCOPIC CHOLECYSTECTOMY;  Surgeon: Lucretia Roers, MD;  Location: [facility] OR[facility];  Service: General;  Laterality: N/A;   COLONOSCOPY     COLONOSCOPY  WITH PROPOFOL N/A 09/27/2020   Procedure: COLONOSCOPY WITH PROPOFOL;  Surgeon: Dolores Frame, MD;  Location: [facility];  Service: Gastroenterology;  Laterality: N/A;   COLONOSCOPY WITH PROPOFOL N/A 06/29/2023   Procedure: COLONOSCOPY WITH PROPOFOL;  Surgeon: Dolores Frame, MD;  Location: [facility];  Service: Gastroenterology;  Laterality: N/A;  1:30PM;ASA 3   CORONARY ANGIOPLASTY     CORONARY ANGIOPLASTY WITH STENT PLACEMENT  2002; 2003; 11/20/2014   "I have 4 stents after today" (11/20/2014)   CORONARY ARTERY BYPASS GRAFT N/A 01/09/2016   Procedure: CORONARY ARTERY BYPASS GRAFTING (CABG) X 3 UTILIZING RIGHT AND LEFT INTERNAL MAMMARY ARTERY AND ENDOSCOPICALLY HARVESTED SAPHENEOUS VEIN.;  Surgeon: Purcell Nails, MD;  Location: [facility] OR;  Service: Open Heart Surgery;  Laterality: N/A;   ESOPHAGOGASTRODUODENOSCOPY     ESOPHAGOGASTRODUODENOSCOPY (EGD) WITH PROPOFOL N/A 09/27/2020   Procedure: ESOPHAGOGASTRODUODENOSCOPY (EGD) WITH PROPOFOL;  Surgeon: Dolores Frame, MD;  [facility]:  [facility];  Service: Gastroenterology;  Laterality: N/A;  10   ESOPHAGOGASTRODUODENOSCOPY (EGD) WITH PROPOFOL N/A 06/29/2023   Procedure: ESOPHAGOGASTRODUODENOSCOPY (EGD) WITH PROPOFOL;  Surgeon: Dolores Frame, MD;  [facility]: [facility];  Service: Gastroenterology;  Laterality: N/A;  1:30PM;ASA 3   KNEE SURGERY Left 02/2012   "scraped; open"   LEFT HEART CATH AND CORS/GRAFTS ANGIOGRAPHY N/A 08/09/2017   Procedure: LEFT HEART CATH AND CORS/GRAFTS ANGIOGRAPHY;  Surgeon: Yvonne Kendall, MD;  [facility];  Service: Cardiovascular;  Laterality: N/A;   LEFT HEART CATH AND CORS/GRAFTS ANGIOGRAPHY N/A 11/22/2018   Procedure: LEFT HEART CATH AND CORS/GRAFTS ANGIOGRAPHY;  Surgeon: Swaziland, [name], MD;  Location: [facility] INVASIVE CV LAB;  Service: Cardiovascular;  Laterality: N/A;   LEFT HEART CATH AND CORS/GRAFTS ANGIOGRAPHY N/A 08/20/2022   Procedure: LEFT HEART CATH  AND CORS/GRAFTS ANGIOGRAPHY;  Surgeon: Kathleene Hazel, MD;  [facility];  Service: Cardiovascular;  Laterality: N/A;   LEFT HEART CATHETERIZATION WITH CORONARY ANGIOGRAM N/A 07/20/2012   Procedure: LEFT HEART CATHETERIZATION WITH CORONARY ANGIOGRAM;  Surgeon: Iran Ouch, MD;  [facility]: [facility];  Service: Cardiovascular;  Laterality: N/A;   LEFT HEART CATHETERIZATION WITH CORONARY ANGIOGRAM N/A 11/20/2014   Procedure: LEFT HEART CATHETERIZATION WITH CORONARY ANGIOGRAM;  Surgeon: [name] Swaziland, MD;  Location: [facility] CATH LAB;  Service: Cardiovascular;  Laterality: N/A;   LEFT HEART CATHETERIZATION WITH CORONARY ANGIOGRAM N/A 11/26/2014   Procedure: LEFT HEART CATHETERIZATION WITH CORONARY ANGIOGRAM;  Surgeon: [name] Swaziland, MD;  Location: [facility] CATH LAB;  Service: Cardiovascular;  Laterality: N/A;   NEUROPLASTY / TRANSPOSITION ULNAR NERVE AT ELBOW Right ~ 2012   PERCUTANEOUS CORONARY ROTOBLATOR INTERVENTION (PCI-R)  11/20/2014   Procedure: PERCUTANEOUS CORONARY ROTOBLATOR INTERVENTION (PCI-R);  Surgeon: [name] Swaziland, MD;  Location: [facility] CATH LAB;  Service: Cardiovascular;;   POSTERIOR CERVICAL LAMINECTOMY Left 04/16/2022   Procedure: Laminectomy and Foraminotomy - left - C6-C7;  Surgeon: Julio Sicks, MD;  Location: [facility] OR;  Service: Neurosurgery;  Laterality: Left;  3C   SHOULDER ARTHROSCOPY Left ~ 2011   TEE WITHOUT CARDIOVERSION N/A 01/09/2016   Procedure: TRANSESOPHAGEAL ECHOCARDIOGRAM (TEE);  Surgeon: Purcell Nails, MD;  Location: [facility] OR;  Service: Open Heart Surgery;  Laterality: N/A;    Social History: Social History   Socioeconomic History   Marital status: Married    Spouse name: [name]   Number of children: 3   Years of education: 12 th    Highest education level: Not on file  Occupational History   Occupation: Unemployed  Tobacco Use   Smoking status: Never    Passive exposure: Current   Smokeless tobacco: Never  Vaping Use   Vaping status: Never  Used  Substance and Sexual Activity   Alcohol use: No   Drug use: No   Sexual activity: Not on file  Other Topics Concern   Not on file  Social History Narrative   Patient lives at home with wife [name].   Patient works at [employer], Social research officer, government.   Patient has a 12 th grade education.    Patient has 3 children.       Social Determinants of Health   Financial Resource Strain: Low Risk  (02/03/2023)   Overall Financial Resource Strain (CARDIA)    Difficulty of Paying Living Expenses: Not hard at all  Food Insecurity: No Food Insecurity (02/03/2023)   Hunger Vital Sign    Worried About Running Out of Food in  the Last Year: Never true    Ran Out of Food in the Last Year: Never true  Transportation Needs: No Transportation Needs (02/03/2023)   PRAPARE - Administrator, Civil Service (Medical): No    Lack of Transportation (Non-Medical): No  Physical Activity: Sufficiently Active (11/23/2022)   Exercise Vital Sign    Days of Exercise per Week: 7 days    Minutes of Exercise per Session: 30 min  Stress: No Stress Concern Present (11/23/2022)   [employer] of Occupational Health - Occupational Stress Questionnaire    Feeling of Stress : Not at all  Social Connections: Unknown (03/22/2022)   [facility]   Social Network    Social Network: Not on file  Intimate Partner Violence: Not At Risk (01/07/2023)   Humiliation, Afraid, Rape, and Kick questionnaire    Fear of Current or Ex-Partner: No    Emotionally Abused: No    Physically Abused: No    Sexually Abused: No    Family History: Family History  Problem Relation Age of Onset   Stroke Mother    Coronary artery disease Father 61   Asthma Sister    Multiple sclerosis Brother    Heart disease Paternal Uncle    Stroke Maternal Grandmother    Heart attack Paternal Grandmother    Cancer Paternal Grandfather    Asthma Sister    Seizures Son    Migraines Son    Autism  Son    Migraines Son     Current Medications:  Current Outpatient Medications:    albuterol (VENTOLIN HFA) 108 (90 Base) MCG/ACT inhaler, Inhale 2 puffs into the lungs every 6 (six) hours as needed for wheezing or shortness of breath., Disp: , Rfl:    apixaban (ELIQUIS) 5 MG TABS tablet, Take 1 tablet (5 mg total) by mouth 2 (two) times daily., Disp: 60 tablet, Rfl: 6   carvedilol (COREG) 6.25 MG tablet, Take 1 tablet (6.25 mg total) by mouth 2 (two) times daily with a meal., Disp: 180 tablet, Rfl: 3   citalopram (CELEXA) 20 MG tablet, Take 1 tablet (20 mg total) by mouth at bedtime., Disp: 90 tablet, Rfl: 0   Continuous Blood Gluc Receiver (FREESTYLE LIBRE 2 READER) DEVI, 1 Device by Does not apply route continuous., Disp: 1 each, Rfl: 0   Continuous Blood Gluc Sensor (FREESTYLE LIBRE 2 SENSOR) MISC, Apply one sensor every 14 days, Disp: 6 each, Rfl: 3   cyclobenzaprine (FLEXERIL) 10 MG tablet, TAKE 1 TABLET BY MOUTH THREE TIMES DAILY AS NEEDED FOR MUSCLE SPASMS, Disp: 30 tablet, Rfl: 10   ezetimibe (ZETIA) 10 MG tablet, TAKE 1 TABLET BY MOUTH ONCE DAILY, Disp: 30 tablet, Rfl: 10   FARXIGA 5 MG TABS tablet, TAKE 1 TABLET BY MOUTH DAILY BEFORE BREAKFAST, Disp: 30 tablet, Rfl: 10   fluticasone (FLONASE) 50 MCG/ACT nasal spray, Place 2 sprays into both nostrils 2 (two) times daily., Disp: 15.8 mL, Rfl: 3   fluticasone-salmeterol (ADVAIR) 250-50 MCG/ACT AEPB, INHALE 1 PUFF BY MOUTH TWICE DAILY, Disp: 60 each, Rfl: 10   gabapentin (NEURONTIN) 300 MG capsule, TAKE 1 CAPSULE BY MOUTH THREE TIMES DAILY, Disp: 90 capsule, Rfl: 10   isosorbide mononitrate (IMDUR) 60 MG 24 hr tablet, Take one (1) tablet by mouth ( 60 mg ) in the am and one half tablet ( 30 mg) in the pm., Disp: 90 tablet, Rfl: 1   Melatonin 10 MG TABS, Take 10 mg by mouth at bedtime.,  Disp: , Rfl:    nitroGLYCERIN (NITROSTAT) 0.4 MG SL tablet, DISSOLVE 1 TABLET UNDER THE TONGUE AS NEEDED FOR CHEST PAIN EVERY 5 MINUTES UP TO 3 TIMES. IF NO  RELIEF CALL 911., Disp: 25 tablet, Rfl: 10   omeprazole (PRILOSEC) 40 MG capsule, Take 1 capsule (40 mg total) by mouth daily., Disp: 30 capsule, Rfl: 10   pravastatin (PRAVACHOL) 20 MG tablet, Take 1 tablet (20 mg total) by mouth daily., Disp: 30 tablet, Rfl: 1   rosuvastatin (CRESTOR) 20 MG tablet, Take 1 tablet (20 mg total) by mouth daily., Disp: 30 tablet, Rfl: 2   tirzepatide ([name]) 5 MG/0.5ML Pen, Inject 5 mg into the skin once a week., Disp: 12 mL, Rfl: 0   zonisamide (ZONEGRAN) 100 MG capsule, Take 100 mg by mouth daily., Disp: , Rfl:    Allergies: Allergies  Allergen Reactions   Divalproex Sodium Other (See Comments)    Causes anger   Statins Other (See Comments)    Muscle aches and cramps   Tramadol Other (See Comments)    Chest pain    Valproic Acid Other (See Comments)    Causes anger   Gadolinium Derivatives Nausea And Vomiting    07/25/19 Pt vomited immediately after IV gad. Denies itching, dyspnea.  (Adverse, not allergic, reaction   Tricor [Fenofibrate] Other (See Comments)    Leg cramps    REVIEW OF SYSTEMS:   Review of Systems  Constitutional:  Negative for chills, fatigue and fever.  HENT:   Negative for lump/mass, mouth sores, nosebleeds, sore throat and trouble swallowing.   Eyes:  Negative for eye problems.  Respiratory:  Negative for cough and shortness of breath.   Cardiovascular:  Negative for chest pain, leg swelling and palpitations.  Gastrointestinal:  Negative for abdominal pain, constipation, diarrhea, nausea and vomiting.  Genitourinary:  Negative for bladder incontinence, difficulty urinating, dysuria, frequency, hematuria and nocturia.   Musculoskeletal:  Negative for arthralgias, back pain, flank pain, myalgias and neck pain.  Skin:  Negative for itching and rash.  Neurological:  Positive for headaches. Negative for dizziness and numbness.  Hematological:  Does not bruise/bleed easily.  Psychiatric/Behavioral:  Negative for depression, sleep  disturbance and suicidal ideas. The patient is not nervous/anxious.   All other systems reviewed and are negative.    VITALS:   There were no vitals taken for this visit.  Wt Readings from Last 3 Encounters:  06/29/23 189 lb 1.6 oz (85.8 kg)  06/23/23 189 lb 1.6 oz (85.8 kg)  06/03/23 189 lb 1.6 oz (85.8 kg)    There is no height or weight on file to calculate BMI.  Performance status (ECOG): 1 - Symptomatic but completely ambulatory  PHYSICAL EXAM:   Physical Exam Vitals and nursing note reviewed. Exam conducted with a chaperone present.  Constitutional:      Appearance: Normal appearance.  Cardiovascular:     Rate and Rhythm: Normal rate and regular rhythm.     Pulses: Normal pulses.     Heart sounds: Normal heart sounds.  Pulmonary:     Effort: Pulmonary effort is normal.     Breath sounds: Normal breath sounds.  Abdominal:     Palpations: Abdomen is soft. There is no hepatomegaly, splenomegaly or mass.     Tenderness: There is no abdominal tenderness.  Musculoskeletal:     Right lower leg: No edema.     Left lower leg: No edema.  Lymphadenopathy:     Cervical: No cervical adenopathy.  Right cervical: No superficial, deep or posterior cervical adenopathy.    Left cervical: No superficial, deep or posterior cervical adenopathy.     Upper Body:     Right upper body: No supraclavicular or axillary adenopathy.     Left upper body: No supraclavicular or axillary adenopathy.  Neurological:     General: No focal deficit present.     Mental Status: He is alert and oriented to person, place, and time.  Psychiatric:        Mood and Affect: Mood normal.        Behavior: Behavior normal.     LABS:      Latest Ref Rng & Units 08/04/2023   11:03 AM 06/23/2023    8:08 AM 03/30/2023   12:18 PM  CBC  WBC 4.0 - 10.5 K/uL 2.7  1.8  2.7   Hemoglobin 13.0 - 17.0 g/dL 96.2  95.2  84.1   Hematocrit 39.0 - 52.0 % 44.1  42.8  42.3   Platelets 150 - 400 K/uL 71  60  74        Latest Ref Rng & Units 06/23/2023    8:08 AM 03/08/2023    9:01 AM 02/02/2023    5:55 AM  CMP  Glucose 70 - 99 mg/dL 324  91  401   BUN 8 - 23 mg/dL 15  8  11    Creatinine 0.61 - 1.24 mg/dL 0.27  2.53  6.64   Sodium 135 - 145 mmol/L 137  140  135   Potassium 3.5 - 5.1 mmol/L 4.1  3.9  3.6   Chloride 98 - 111 mmol/L 105  106  108   CO2 22 - 32 mmol/L 24  17  18    Calcium 8.9 - 10.3 mg/dL 9.0  9.2  8.7   Total Protein 6.5 - 8.1 g/dL 6.7     Total Bilirubin 0.3 - 1.2 mg/dL 0.5     Alkaline Phos 38 - 126 U/L 51     AST 15 - 41 U/L 26     ALT 0 - 44 U/L 31        No results found for: "CEA1", "CEA" / No results found for: "CEA1", "CEA" No results found for: "PSA1" No results found for: "CAN199" No results found for: "CAN125"  Lab Results  Component Value Date   TOTALPROTELP 7.8 12/26/2018   ALBUMINELP 3.1 12/26/2018   A1GS 0.3 12/26/2018   A2GS 1.0 12/26/2018   BETS 1.6 (H) 12/26/2018   GAMS 1.8 12/26/2018   MSPIKE Not Observed 12/26/2018   SPEI Comment 12/26/2018   Lab Results  Component Value Date   TIBC 388 08/20/2021   TIBC 402 02/18/2021   TIBC 374 05/31/2020   FERRITIN 27 08/20/2021   FERRITIN 36 02/18/2021   FERRITIN 100 05/31/2020   IRONPCTSAT 29 08/20/2021   IRONPCTSAT 25 02/18/2021   IRONPCTSAT 27 05/31/2020   Lab Results  Component Value Date   LDH 157 02/18/2021   LDH 138 08/30/2019     STUDIES:   No results found.

## 2023-08-17 ENCOUNTER — Encounter: Payer: Self-pay | Admitting: Internal Medicine

## 2023-08-17 ENCOUNTER — Ambulatory Visit (INDEPENDENT_AMBULATORY_CARE_PROVIDER_SITE_OTHER): Payer: 59 | Admitting: Internal Medicine

## 2023-08-17 VITALS — BP 114/66 | HR 70 | Ht 69.0 in | Wt 199.8 lb

## 2023-08-17 DIAGNOSIS — Z794 Long term (current) use of insulin: Secondary | ICD-10-CM

## 2023-08-17 DIAGNOSIS — M542 Cervicalgia: Secondary | ICD-10-CM

## 2023-08-17 DIAGNOSIS — Z9889 Other specified postprocedural states: Secondary | ICD-10-CM

## 2023-08-17 DIAGNOSIS — E785 Hyperlipidemia, unspecified: Secondary | ICD-10-CM | POA: Diagnosis not present

## 2023-08-17 DIAGNOSIS — E1100 Type 2 diabetes mellitus with hyperosmolarity without nonketotic hyperglycemic-hyperosmolar coma (NKHHC): Secondary | ICD-10-CM | POA: Diagnosis not present

## 2023-08-17 DIAGNOSIS — I2699 Other pulmonary embolism without acute cor pulmonale: Secondary | ICD-10-CM | POA: Diagnosis not present

## 2023-08-17 DIAGNOSIS — G4733 Obstructive sleep apnea (adult) (pediatric): Secondary | ICD-10-CM

## 2023-08-17 NOTE — Patient Instructions (Signed)
It was a pleasure to see you today.  Thank you for giving [place] the opportunity to be involved in your care.  Below is a brief recap of your visit and next steps.  We will plan to see you again in 6 months.  Summary No medication changes today Repeat A1c Follow up in 6 months I recommend following up with Dr. Jordan Likes about your neck Please reschedule with sleep medicine for management of sleep apnea

## 2023-08-19 ENCOUNTER — Other Ambulatory Visit: Payer: Self-pay | Admitting: Internal Medicine

## 2023-08-19 LAB — BAYER DCA HB A1C WAIVED: HB A1C (BAYER DCA - WAIVED): 5.8 % — ABNORMAL HIGH (ref 4.8–5.6)

## 2023-09-07 ENCOUNTER — Ambulatory Visit: Payer: 59 | Admitting: Dermatology

## 2023-09-07 ENCOUNTER — Ambulatory Visit (INDEPENDENT_AMBULATORY_CARE_PROVIDER_SITE_OTHER): Payer: 59 | Admitting: Gastroenterology

## 2023-09-07 ENCOUNTER — Encounter: Payer: Self-pay | Admitting: Dermatology

## 2023-09-07 ENCOUNTER — Encounter (INDEPENDENT_AMBULATORY_CARE_PROVIDER_SITE_OTHER): Payer: Self-pay | Admitting: Gastroenterology

## 2023-09-07 VITALS — BP 139/63

## 2023-09-07 VITALS — BP 116/69 | HR 66 | Temp 97.1°F | Ht 69.0 in | Wt 203.3 lb

## 2023-09-07 DIAGNOSIS — W908XXA Exposure to other nonionizing radiation, initial encounter: Secondary | ICD-10-CM

## 2023-09-07 DIAGNOSIS — K7460 Unspecified cirrhosis of liver: Secondary | ICD-10-CM

## 2023-09-07 DIAGNOSIS — L578 Other skin changes due to chronic exposure to nonionizing radiation: Secondary | ICD-10-CM | POA: Diagnosis not present

## 2023-09-07 DIAGNOSIS — Z5111 Encounter for antineoplastic chemotherapy: Secondary | ICD-10-CM

## 2023-09-07 DIAGNOSIS — I8500 Esophageal varices without bleeding: Secondary | ICD-10-CM | POA: Diagnosis not present

## 2023-09-07 DIAGNOSIS — L570 Actinic keratosis: Secondary | ICD-10-CM

## 2023-09-07 DIAGNOSIS — R1310 Dysphagia, unspecified: Secondary | ICD-10-CM

## 2023-09-07 DIAGNOSIS — Z808 Family history of malignant neoplasm of other organs or systems: Secondary | ICD-10-CM | POA: Diagnosis not present

## 2023-09-07 MED ORDER — FLUOROURACIL 5 % EX CREA
TOPICAL_CREAM | Freq: Two times a day (BID) | CUTANEOUS | 0 refills | Status: DC
Start: 2023-09-07 — End: 2024-03-07

## 2023-09-07 NOTE — Progress Notes (Signed)
   New Patient Visit   Subjective  [name] [name] is a 67 y.o. male who presents for the following: He has scaly spots on his scalp and arms. The ones on his scalp get sore sometimes. No personal history of skin cancer. His father had a history of skin cancer.  The following portions of the chart were reviewed this encounter and updated as appropriate: medications, allergies, medical history  Review of Systems:  No other skin or systemic complaints except as noted in HPI or Assessment and Plan.  Objective  Well appearing patient in no apparent distress; mood and affect are within normal limits.   A focused examination was performed of the following areas: Scalp, face, arms  Relevant exam findings are noted in the Assessment and Plan.  Right forehead x 2, right infraorbital x 1 (3) Erythematous thin papules/macules with gritty scale.    Assessment & Plan    AK (actinic keratosis) (3) Right forehead x 2, right infraorbital x 1  ACTINIC DAMAGE WITH PRECANCEROUS ACTINIC KERATOSES Counseling for Topical Chemotherapy Management: Patient exhibits: - Severe, confluent actinic changes with pre-cancerous actinic keratoses that is secondary to cumulative UV radiation exposure over time - Condition that is severe; chronic, not at goal. - diffuse scaly erythematous macules and papules with underlying dyspigmentation - Discussed Prescription "Field Treatment" topical Chemotherapy for Severe, Chronic Confluent Actinic Changes with Pre-Cancerous Actinic Keratoses Field treatment involves treatment of an entire area of skin that has confluent Actinic Changes (Sun/ Ultraviolet light damage) and PreCancerous Actinic Keratoses by method of PhotoDynamic Therapy (PDT) and/or prescription Topical Chemotherapy agents such as 5-fluorouracil, 5-fluorouracil/calcipotriene, and/or imiquimod.  The purpose is to decrease the number of clinically evident and subclinical PreCancerous lesions to prevent  progression to development of skin cancer by chemically destroying early precancer changes that may or may not be visible.  It has been shown to reduce the risk of developing skin cancer in the treated area. As a result of treatment, redness, scaling, crusting, and open sores may occur during treatment course. One or more than one of these methods may be used and may have to be used several times to control, suppress and eliminate the PreCancerous changes. Discussed treatment course, expected reaction, and possible side effects. - Recommend daily broad spectrum sunscreen SPF 30+ to sun-exposed areas, reapply every 2 hours as needed.  - Staying in the shade or wearing long sleeves, sun glasses (UVA+UVB protection) and wide brim hats (4-inch brim around the entire circumference of the hat) are also recommended. - Call for new or changing lesions.  Fluorouracil 5% cream twice daily to scalp, forehead and cheeks x 2 weeks - discussed risk of crusting and erythema. Avoid prolonged sunlight while using medication.  fluorouracil (EFUDEX) 5 % cream - Right forehead x 2, right infraorbital x 1 (3) Apply topically 2 (two) times daily.    Return in about 6 weeks (around 10/19/2023).  I, Joanie Coddington, CMA, am acting as scribe for Gwenith Daily, MD .   Documentation: I have reviewed the above documentation for accuracy and completeness, and I agree with the above.  Gwenith Daily, MD

## 2023-09-07 NOTE — Patient Instructions (Signed)
-   Reduce salt intake to <2 g per day - Can take Tylenol max of 2 g per day (650 mg q8h) for pain - Avoid NSAIDs for pain - Avoid eating raw oysters/shellfish - protein shake 1-2 times per day between meals  -continue coreg 6.25mg  twice daily -we will order a barium pill swallow test to further evaluate your swallowing -due for labs and ultrasound for the liver again in February  Follow up 6 months  It was a pleasure to see you today. I want to create trusting relationships with patients and provide genuine, compassionate, and quality care. I truly value your feedback! please be on the lookout for a survey regarding your visit with me today. I appreciate your input about our visit and your time in completing this!    Maeryn Mcgath L. Jeanmarie Hubert, MSN, APRN, AGNP-C Adult-Gerontology Nurse Practitioner [facility]

## 2023-09-07 NOTE — Progress Notes (Addendum)
Referring Provider: [name], MD Primary Care Physician:  [name], MD Primary GI Physician: Dr. Levon Hedger   Chief Complaint  Patient presents with   Follow-up    Patient here today for a follow up appointment due to Cirrhosis. Patient denies any current issues.   HPI:   [name] [name] is a 67 y.o. male with past medical history of myocardial infarction status post CABG, anxiety, asthma, Elita Boone cirrhosis, hyperlipidemia, OSA, and type 2 diabetes    Patient presenting today for follow up of cirrhosis and dysphagia   Last seen July 2024 at that time patient having issues with dysphagia.  He denies any swelling to his abdomen, had lost some weight taking [name].  He did also note stools had changed and were flat and skinny.  Recommended updating right upper quadrant ultrasound, AFP tumor marker, MELD labs in September, schedule EGD and colonoscopy, continue carvedilol 3.125 mg twice daily  EGD and Colonoscopy as below, carvedilol increased to 6.25mg  BID  AFP in July was 2.1   Present: Patient doing well today. Notes he is taking benefiber some. Stools are less skinny than previously. Having a BM atleast 2-3 times per day. Note some bloating at times depending on what he eats but no overt swelling to the abdomen. No rectal bleeding or melena. No episodes of confusion. No swelling to his legs. He is still doing mounjaro. Not currently doing any protein shakes.   Still having issues with dysphagia, noting occurs almost daily usually with foods. No issues swallowing liquids or pills. Denies nausea, vomiting, early satiety.   Previous MELD 3.0: August 2024 --9  Cirrhosis related questions: Episodes of confusion/disorientation: none Taking diuretics? No  Beta blockers? Coreg 6.25mg  BID  Prior history of variceal banding? No  Prior episodes of SBP? No  Last liver imaging: 06/2023 Findings consistent with cirrhosis. No focal liver lesion identified. Alcohol use: none    Last Colonoscopy:06/2023- The entire examined colon is normal.                           - Non-bleeding internal hemorrhoids.                           - No specimens collected. Last Endoscopy:- 06/2023 Grade II esophageal varices.                           - Non-obstructing Schatzki ring.                           - Normal stomach.                           - Normal examined duodenum.                           - No specimens collected.  Recommendations:  Repeat colonoscopy 10 years   Past Medical History:  Diagnosis Date   Anxiety    Aortic insufficiency    Arthritis    Asthma    Cataract    Right eye   Chronic lower back pain    Cirrhosis of liver (HCC)    Coronary atherosclerosis of native coronary artery    a. s/p multiple prior stents, s/p CABG in 01/2016 with LIMA-LAD,  Free RIMA-OM2 and SVG-PDA   Essential hypertension    Fatty liver    GERD (gastroesophageal reflux disease)    Hearing loss of left ear    History of gout    History of hiatal hernia    Hypercholesteremia    Iron deficiency 08/27/2021   Lumbar herniated disc    MI, old 2017   Migraine    OSA (obstructive sleep apnea)    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   LIMA to LAD, free RIMA to OM2, SVG to PDA, open SVG harvest from right thigh   Stroke (HCC)    Thrombocytopenia (HCC)    TIA (transient ischemic attack)    Type 2 diabetes mellitus (HCC)     Past Surgical History:  Procedure Laterality Date   ANTERIOR CERVICAL DECOMP/DISCECTOMY FUSION  1998   "C3-4"   CARDIAC CATHETERIZATION  "several"   CARDIAC CATHETERIZATION N/A 12/30/2015   Procedure: Left Heart Cath and Coronary Angiography;  Surgeon: Runell Gess, MD;  [facility]: [facility];  Service: Cardiovascular;  Laterality: N/A;   CARPAL TUNNEL RELEASE Bilateral 2005   CHOLECYSTECTOMY N/A 11/28/2018   Procedure: LAPAROSCOPIC CHOLECYSTECTOMY;  Surgeon: Lucretia Roers, MD;  Location: [facility];  Service: General;  Laterality: N/A;   COLONOSCOPY      COLONOSCOPY WITH PROPOFOL N/A 09/27/2020   Procedure: COLONOSCOPY WITH PROPOFOL;  Surgeon: Dolores Frame, MD;  Location: [facility];  Service: Gastroenterology;  Laterality: N/A;   COLONOSCOPY WITH PROPOFOL N/A 06/29/2023   Procedure: COLONOSCOPY WITH PROPOFOL;  Surgeon: Dolores Frame, MD;  Location: [facility];  Service: Gastroenterology;  Laterality: N/A;  1:30PM;ASA 3   CORONARY ANGIOPLASTY     CORONARY ANGIOPLASTY WITH STENT PLACEMENT  2002; 2003; 11/20/2014   "I have 4 stents after today" (11/20/2014)   CORONARY ARTERY BYPASS GRAFT N/A 01/09/2016   Procedure: CORONARY ARTERY BYPASS GRAFTING (CABG) X 3 UTILIZING RIGHT AND LEFT INTERNAL MAMMARY ARTERY AND ENDOSCOPICALLY HARVESTED SAPHENEOUS VEIN.;  Surgeon: Purcell Nails, MD;  Location: [facility];  Service: Open Heart Surgery;  Laterality: N/A;   ESOPHAGOGASTRODUODENOSCOPY     ESOPHAGOGASTRODUODENOSCOPY (EGD) WITH PROPOFOL N/A 09/27/2020   Procedure: ESOPHAGOGASTRODUODENOSCOPY (EGD) WITH PROPOFOL;  Surgeon: Dolores Frame, MD;  [facility];  Service: Gastroenterology;  Laterality: N/A;  10   ESOPHAGOGASTRODUODENOSCOPY (EGD) WITH PROPOFOL N/A 06/29/2023   Procedure: ESOPHAGOGASTRODUODENOSCOPY (EGD) WITH PROPOFOL;  Surgeon: Dolores Frame, MD;  [facility];  Service: Gastroenterology;  Laterality: N/A;  1:30PM;ASA 3   KNEE SURGERY Left 02/2012   "scraped; open"   LEFT HEART CATH AND CORS/GRAFTS ANGIOGRAPHY N/A 08/09/2017   Procedure: LEFT HEART CATH AND CORS/GRAFTS ANGIOGRAPHY;  Surgeon: Yvonne Kendall, MD;  [facility];  Service: Cardiovascular;  Laterality: N/A;   LEFT HEART CATH AND CORS/GRAFTS ANGIOGRAPHY N/A 11/22/2018   Procedure: LEFT HEART CATH AND CORS/GRAFTS ANGIOGRAPHY;  Surgeon: Swaziland, [name], MD;  Location: [facility] INVASIVE CV LAB;  Service: Cardiovascular;  Laterality: N/A;   LEFT HEART CATH AND CORS/GRAFTS ANGIOGRAPHY N/A 08/20/2022   Procedure:  LEFT HEART CATH AND CORS/GRAFTS ANGIOGRAPHY;  Surgeon: Kathleene Hazel, MD;  [facility]: [facility];  Service: Cardiovascular;  Laterality: N/A;   LEFT HEART CATHETERIZATION WITH CORONARY ANGIOGRAM N/A 07/20/2012   Procedure: LEFT HEART CATHETERIZATION WITH CORONARY ANGIOGRAM;  Surgeon: Iran Ouch, MD;  Location: [facility];  Service: Cardiovascular;  Laterality: N/A;   LEFT HEART CATHETERIZATION WITH CORONARY ANGIOGRAM N/A 11/20/2014  Procedure: LEFT HEART CATHETERIZATION WITH CORONARY ANGIOGRAM;  Surgeon: [name] Swaziland, MD;  Location: [facility] CATH LAB;  Service: Cardiovascular;  Laterality: N/A;   LEFT HEART CATHETERIZATION WITH CORONARY ANGIOGRAM N/A 11/26/2014   Procedure: LEFT HEART CATHETERIZATION WITH CORONARY ANGIOGRAM;  Surgeon: [name] Swaziland, MD;  Location: [facility] CATH LAB;  Service: Cardiovascular;  Laterality: N/A;   NEUROPLASTY / TRANSPOSITION ULNAR NERVE AT ELBOW Right ~ 2012   PERCUTANEOUS CORONARY ROTOBLATOR INTERVENTION (PCI-R)  11/20/2014   Procedure: PERCUTANEOUS CORONARY ROTOBLATOR INTERVENTION (PCI-R);  Surgeon: [name] Swaziland, MD;  Location[facility] CATH LAB;  Service: Cardiovascular;;   POSTERIOR CERVICAL LAMINECTOMY Left 04/16/2022   Procedure: Laminectomy and Foraminotomy - left - C6-C7;  Surgeon: Julio Sicks, MD;  Location: [facility] OR;  Service: Neurosurgery;  Laterality: Left;  3C   SHOULDER ARTHROSCOPY Left ~ 2011   TEE WITHOUT CARDIOVERSION N/A 01/09/2016   Procedure: TRANSESOPHAGEAL ECHOCARDIOGRAM (TEE);  Surgeon: Purcell Nails, MD;  Location: [facility] OR;  Service: Open Heart Surgery;  Laterality: N/A;    Current Outpatient Medications  Medication Sig Dispense Refill   albuterol (VENTOLIN HFA) 108 (90 Base) MCG/ACT inhaler Inhale 2 puffs into the lungs every 6 (six) hours as needed for wheezing or shortness of breath.     apixaban (ELIQUIS) 5 MG TABS tablet Take 1 tablet (5 mg total) by mouth 2 (two) times daily. 60 tablet 6   carvedilol (COREG) 6.25 MG tablet Take 1  tablet (6.25 mg total) by mouth 2 (two) times daily with a meal. 180 tablet 3   citalopram (CELEXA) 20 MG tablet Take 1 tablet (20 mg total) by mouth at bedtime. 90 tablet 0   Continuous Blood Gluc Receiver (FREESTYLE LIBRE 2 READER) DEVI 1 Device by Does not apply route continuous. 1 each 0   Continuous Blood Gluc Sensor (FREESTYLE LIBRE 2 SENSOR) MISC Apply one sensor every 14 days 6 each 3   cyclobenzaprine (FLEXERIL) 10 MG tablet TAKE 1 TABLET BY MOUTH THREE TIMES DAILY AS NEEDED FOR MUSCLE SPASMS 30 tablet 10   ezetimibe (ZETIA) 10 MG tablet TAKE 1 TABLET BY MOUTH ONCE DAILY 30 tablet 10   FARXIGA 5 MG TABS tablet TAKE 1 TABLET BY MOUTH DAILY BEFORE BREAKFAST 30 tablet 10   fluticasone (FLONASE) 50 MCG/ACT nasal spray Place 2 sprays into both nostrils 2 (two) times daily. 15.8 mL 3   fluticasone-salmeterol (ADVAIR) 250-50 MCG/ACT AEPB INHALE 1 PUFF BY MOUTH TWICE DAILY 60 each 10   gabapentin (NEURONTIN) 300 MG capsule TAKE 1 CAPSULE BY MOUTH THREE TIMES DAILY 90 capsule 10   isosorbide mononitrate (IMDUR) 60 MG 24 hr tablet Take one (1) tablet by mouth ( 60 mg ) in the am and one half tablet ( 30 mg) in the pm. 90 tablet 1   Melatonin 10 MG TABS Take 10 mg by mouth at bedtime.     nitroGLYCERIN (NITROSTAT) 0.4 MG SL tablet DISSOLVE 1 TABLET UNDER THE TONGUE AS NEEDED FOR CHEST PAIN EVERY 5 MINUTES UP TO 3 TIMES. IF NO RELIEF CALL 911. 25 tablet 10   omeprazole (PRILOSEC) 40 MG capsule Take 1 capsule (40 mg total) by mouth daily. 30 capsule 10   pravastatin (PRAVACHOL) 20 MG tablet TAKE 1 TABLET BY MOUTH DAILY. 30 tablet 10   tirzepatide ([name]) 5 MG/0.5ML Pen Inject 5 mg into the skin once a week. 12 mL 0   zonisamide (ZONEGRAN) 100 MG capsule Take 100 mg by mouth daily.     No current facility-administered  medications for this visit.    Allergies as of 09/07/2023 - Review Complete 09/07/2023  Allergen Reaction Noted   Divalproex sodium Other (See Comments) 03/07/2018   Statins Other  (See Comments) 12/03/2014   Tramadol Other (See Comments) 03/29/2017   Valproic acid Other (See Comments) 09/14/2016   Gadolinium derivatives Nausea And Vomiting 07/25/2019   Tricor [fenofibrate] Other (See Comments) 01/08/2015    Family History  Problem Relation Age of Onset   Stroke Mother    Coronary artery disease Father 54   Asthma Sister    Multiple sclerosis Brother    Heart disease Paternal Uncle    Stroke Maternal Grandmother    Heart attack Paternal Grandmother    Cancer Paternal Grandfather    Asthma Sister    Seizures Son    Migraines Son    Autism Son    Migraines Son     Social History   Socioeconomic History   Marital status: Married    Spouse name: [name]   Number of children: 3   Years of education: 12 th    Highest education level: Not on file  Occupational History   Occupation: Unemployed  Tobacco Use   Smoking status: Never    Passive exposure: Current   Smokeless tobacco: Never  Vaping Use   Vaping status: Never Used  Substance and Sexual Activity   Alcohol use: No   Drug use: No   Sexual activity: Not on file  Other Topics Concern   Not on file  Social History Narrative   Patient lives at home with wife [name].   Patient works at [employer], Social research officer, government.   Patient has a 12 th grade education.    Patient has 3 children.       Social Determinants of Health   Financial Resource Strain: Low Risk  (02/03/2023)   Overall Financial Resource Strain (CARDIA)    Difficulty of Paying Living Expenses: Not hard at all  Food Insecurity: No Food Insecurity (02/03/2023)   Hunger Vital Sign    Worried About Running Out of Food in the Last Year: Never true    Ran Out of Food in the Last Year: Never true  Transportation Needs: No Transportation Needs (02/03/2023)   PRAPARE - Administrator, Civil Service (Medical): No    Lack of Transportation (Non-Medical): No  Physical Activity: Sufficiently Active (11/23/2022)   Exercise  Vital Sign    Days of Exercise per Week: 7 days    Minutes of Exercise per Session: 30 min  Stress: No Stress Concern Present (11/23/2022)   [employer] of Occupational Health - Occupational Stress Questionnaire    Feeling of Stress : Not at all  Social Connections: Unknown (03/22/2022)   [facility]   Social Network    Social Network: Not on file    Review of systems General: negative for malaise, night sweats, fever, chills, weight loss Neck: Negative for lumps, goiter, pain and significant neck swelling Resp: Negative for cough, wheezing, dyspnea at rest CV: Negative for chest pain, leg swelling, palpitations, orthopnea GI: denies melena, hematochezia, nausea, vomiting, diarrhea, constipation, odyonophagia, early satiety or unintentional weight loss. +dysphagia  MSK: Negative for joint pain or swelling, back pain, and muscle pain. Derm: Negative for itching or rash Psych: Denies depression, anxiety, memory loss, confusion. No homicidal or suicidal ideation.  Heme: Negative for prolonged bleeding, bruising easily, and swollen nodes. Endocrine: Negative for cold or heat intolerance, polyuria, polydipsia and  goiter. Neuro: negative for tremor, gait imbalance, syncope and seizures. The remainder of the review of systems is noncontributory.  Physical Exam: BP 116/69 (BP Location: Left Arm, Patient Position: Sitting, Cuff Size: Large)   Pulse 66   Temp (!) 97.1 F (36.2 C) (Temporal)   Ht 5\' 9"  (1.753 m)   Wt 203 lb 4.8 oz (92.2 kg)   BMI 30.02 kg/m  General:   Alert and oriented. No distress noted. Pleasant and cooperative.  Head:  Normocephalic and atraumatic. Eyes:  Conjuctiva clear without scleral icterus. Mouth:  Oral mucosa pink and moist. Good dentition. No lesions. Heart: Normal rate and rhythm, s1 and s2 heart sounds present.  Lungs: Clear lung sounds in all lobes. Respirations equal and unlabored. Abdomen:  +BS, soft, non-tender and  non-distended. No rebound or guarding. No HSM or masses noted. Derm: No palmar erythema or jaundice Msk:  Symmetrical without gross deformities. Normal posture. Extremities:  Without edema. Neurologic:  Alert and  oriented x4 Psych:  Alert and cooperative. Normal mood and affect.  Invalid input(s): "6 MONTHS"   ASSESSMENT: [name] [name] is a 67 y.o. male presenting today for follow up of cirrhosis and Dysphagia  Cirrhosis: Patient has remained well compensated, last MELD 3.0 in August was 9.  He is up-to-date on EV screening via EGD with presence of grade 2 esophageal varices, Coreg is increased to 6.25 mg twice daily after EGD in August.  Heart rate today was 66, not quite at goal however BP was 116/69 therefore I am hesitant to increase Coreg any further as I did not want to drop his blood pressure too low.  For now we will continue with Coreg 6.25 mg twice daily.  He is up-to-date on MELD labs and HCC screening and will be due for these again in February.  As patient is on [name] I did encourage him to try and maintain intake of 1-2 protein shakes per day to avoid muscle wasting in setting of weight loss.  Dysphagia: EGD in August with nonobstructing Schatzki ring that was disrupted, however he does not feel that it dysphagia has improved very much.  He is on omeprazole 40 mg daily which seems to control his GERD symptoms.  Notes dysphagia almost daily, usually with foods.  Will proceed with barium pill esophagram for further evaluation of his symptoms as I cannot rule out esophageal dysmotility.  PLAN:  -RUQ [place] feb - MELD labs, AFP, CBC Feb -Continue Coreg 6.25 mg twice daily  -Protein shakes 1-2 times per day -Avoid NSAIDs for pain -Can take Tylenol max of 2 g/day for pain -Reduce salt intake to no more than 2 g/day -Avoid eating raw oysters/shellfish  All questions were answered, patient verbalized understanding and is in agreement with plan as outlined above.   Follow Up: 6  months   Summit Borchardt L. Jeanmarie Hubert, MSN, APRN, AGNP-C Adult-Gerontology Nurse Practitioner [facility]  I have reviewed the note and agree with the APP's assessment as described in this progress note  Katrinka Blazing, MD Gastroenterology and Hepatology [facility] Gastroenterology

## 2023-09-07 NOTE — Patient Instructions (Addendum)
Cryotherapy Aftercare  Wash gently with soap and water everyday.   Apply Vaseline and Band-Aid daily until healed.      5-Fluorouracil Patient Education   Actinic keratoses are the dry, red scaly spots on the skin caused by sun damage. A portion of these spots can turn into skin cancer with time, and treating them can help prevent development of skin cancer.   Treatment of these spots requires removal of the defective skin cells. There are various ways to remove actinic keratoses, including freezing with liquid nitrogen, treatment with creams, or treatment with a blue light procedure in the office.   5-fluorouracil cream is a topical cream used to treat actinic keratoses. It works by interfering with the growth of abnormal fast-growing skin cells, such as actinic keratoses. These cells peel off and are replaced by healthy ones. THIS CREAM SHOULD BE KEPT OUT OF REACH OF CHILDREN AND PETS AND SHOULD NOT BE USED BY PREGNANT WOMEN.  INSTRUCTIONS FOR 5-FLUOROURACIL CREAM:   5-fluorouracil cream typically needs to be used for 7-14 days depending on the location of treatment. A thin layer should be applied twice a day to the treatment areas recommended by your physician.    Avoid contact with your eyes or nostrils. Avoid applying the cream to your eyelids or lips unless directed to apply there by your physician. Do not use 5-fluorouracil on infected or open wounds.   You will develop redness, irritation and some crusting at areas where you have pre-cancer damage/actinic keratoses. IF YOU DEVELOP PAIN, BLEEDING, OR SIGNIFICANT CRUSTING, STOP THE TREATMENT EARLY - you have already gotten a good response and the actinic keratoses should clear up well.  Wash your hands after applying 5-fluorouracil 5% cream on your skin.   A moisturizer or sunscreen with a minimum SPF 30 should be applied each morning. Avoid prolonged sun exposure while using medication.  Once you have finished the treatment, you  can apply a thin layer of Vaseline twice a day to irritated areas to soothe and calm the areas more quickly. If you experience significant discomfort, contact your physician.  For some patients it is necessary to repeat the treatment for best results.  SIDE EFFECTS: When using 5-fluorouracil cream, you may have mild irritation, such as redness, dryness, swelling, or a mild burning sensation. This usually resolves within 2 weeks. The more actinic keratoses you have, the more redness and inflammation you can expect during treatment. Eye irritation has been reported rarely. If this occurs, please let us know.   If you have any trouble using this cream, please send [place] a MyChart Message or call the office. If you have any other questions about this information, please do not hesitate to ask me before you leave the office or reach out on MyChart or by phone.     Important Information  Due to recent changes in healthcare laws, you may see results of your pathology and/or laboratory studies on MyChart before the doctors have had a chance to review them. We understand that in some cases there may be results that are confusing or concerning to you. Please understand that not all results are received at the same time and often the doctors may need to interpret multiple results in order to provide you with the best plan of care or course of treatment. Therefore, we ask that you please give [place] 2 business days to thoroughly review all your results before contacting the office for clarification. Should we see a critical lab result, you  will be contacted sooner.   If You Need Anything After Your Visit  If you have any questions or concerns for your doctor, please call our main line at [phone] If no one answers, please leave a voicemail as directed and we will return your call as soon as possible. Messages left after 4 pm will be answered the following business day.   You may also send [place] a message via  MyChart. We typically respond to MyChart messages within 1-2 business days.  For prescription refills, please ask your pharmacy to contact our office. Our fax number is [phone].  If you have an urgent issue when the clinic is closed that cannot wait until the next business day, you can page your doctor at the number below.    Please note that while we do our best to be available for urgent issues outside of office hours, we are not available 24/7.   If you have an urgent issue and are unable to reach [place], you may choose to seek medical care at your doctor's office, retail clinic, urgent care center, or emergency room.  If you have a medical emergency, please immediately call 911 or go to the emergency department. In the event of inclement weather, please call our main line at [phone] for an update on the status of any delays or closures.  Dermatology Medication Tips: Please keep the boxes that topical medications come in in order to help keep track of the instructions about where and how to use these. Pharmacies typically print the medication instructions only on the boxes and not directly on the medication tubes.   If your medication is too expensive, please contact our office at [phone] or send [place] a message through MyChart.   We are unable to tell what your co-pay for medications will be in advance as this is different depending on your insurance coverage. However, we may be able to find a substitute medication at lower cost or fill out paperwork to get insurance to cover a needed medication.   If a prior authorization is required to get your medication covered by your insurance company, please allow [place] 1-2 business days to complete this process.  Drug prices often vary depending on where the prescription is filled and some pharmacies may offer cheaper prices.  The website www.goodrx.com contains coupons for medications through different pharmacies. The prices here do not  account for what the cost may be with help from insurance (it may be cheaper with your insurance), but the website can give you the price if you did not use any insurance.  - You can print the associated coupon and take it with your prescription to the pharmacy.  - You may also stop by our office during regular business hours and pick up a GoodRx coupon card.  - If you need your prescription sent electronically to a different pharmacy, notify our office through [facility] or by phone at [phone]

## 2023-09-12 ENCOUNTER — Encounter: Payer: Self-pay | Admitting: Internal Medicine

## 2023-09-12 DIAGNOSIS — M542 Cervicalgia: Secondary | ICD-10-CM | POA: Insufficient documentation

## 2023-09-12 NOTE — Assessment & Plan Note (Signed)
A1c 5.9 on labs from March.  Metformin was discontinued at his last appointment.  He remains on [place]. -Repeat A1c ordered today

## 2023-09-12 NOTE — Assessment & Plan Note (Signed)
Lipid panel updated in March.  Total cholesterol 87 and LDL 36.  He is currently prescribed pravastatin 20 mg daily.  No medication changes are indicated.

## 2023-09-12 NOTE — Assessment & Plan Note (Signed)
His acute concern today is pain and a popping sensation along the posterior aspect of his neck where he previously underwent cervical laminectomy and foraminotomies of the left C6-C7 in June 2023.  This was performed by Dr. Jordan Likes.  No radicular symptoms are present.  Extension and lateral head movements are limited secondary to pain.  There are otherwise no acute findings on exam.  Given prior history of neck surgery, I recommended that [name] [name] contact Dr. Lindalou Hose office to arrange follow-up.

## 2023-09-12 NOTE — Assessment & Plan Note (Signed)
Still pending PAP titration in the setting of severe OSA with O2 desaturations noted on HST completed previously.  He missed sleep medicine follow-up scheduled for September.  I strongly encouraged him to call sleep medicine today to reschedule his appointment.

## 2023-09-12 NOTE — Assessment & Plan Note (Signed)
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  Followed by hematology/oncology but missed his recent follow-up appointment.  He remains on Eliquis 5 mg twice daily.  I recommended that he contact oncology to reschedule his appointment to determine continued need for anticoagulation.

## 2023-09-13 ENCOUNTER — Ambulatory Visit (HOSPITAL_COMMUNITY): Payer: 59

## 2023-09-17 ENCOUNTER — Other Ambulatory Visit: Payer: Self-pay | Admitting: Internal Medicine

## 2023-09-21 ENCOUNTER — Encounter: Payer: Self-pay | Admitting: Nurse Practitioner

## 2023-09-21 ENCOUNTER — Ambulatory Visit: Payer: 59 | Attending: Student | Admitting: Nurse Practitioner

## 2023-09-21 VITALS — BP 112/60 | HR 70 | Ht 69.0 in | Wt 201.2 lb

## 2023-09-21 DIAGNOSIS — I10 Essential (primary) hypertension: Secondary | ICD-10-CM | POA: Diagnosis not present

## 2023-09-21 DIAGNOSIS — Z86718 Personal history of other venous thrombosis and embolism: Secondary | ICD-10-CM | POA: Diagnosis not present

## 2023-09-21 DIAGNOSIS — E785 Hyperlipidemia, unspecified: Secondary | ICD-10-CM | POA: Diagnosis not present

## 2023-09-21 DIAGNOSIS — I2510 Atherosclerotic heart disease of native coronary artery without angina pectoris: Secondary | ICD-10-CM | POA: Diagnosis not present

## 2023-09-21 DIAGNOSIS — I5030 Unspecified diastolic (congestive) heart failure: Secondary | ICD-10-CM

## 2023-09-21 DIAGNOSIS — Z86711 Personal history of pulmonary embolism: Secondary | ICD-10-CM

## 2023-09-21 NOTE — Patient Instructions (Addendum)
Medication Instructions:  Your physician recommends that you continue on your current medications as directed. Please refer to the Current Medication list given to you today.   Labwork: None  Testing/Procedures: None  Follow-Up: Your physician recommends that you schedule a follow-up appointment in:6 Months   Any Other Special Instructions Will Be Listed Below (If Applicable).     If you need a refill on your cardiac medications before your next appointment, please call your pharmacy.

## 2023-09-21 NOTE — Progress Notes (Unsigned)
Cardiology Office Note:  .   Date:  09/21/2023 ID:  [name] [name], DOB [birth year], MRN [id number] PCP: [name] Lade, MD  [facility] Providers Cardiologist:  [name] Dell, MD    History of Present Illness: .   [name] [name] is a 67 y.o. male with a PMH of CAD, s/p CABG in 2017, hyperlipidemia, type 2 diabetes, hypertension, HFpEF, history of PE/DVT (Dx in 12/2022), aortic regurgitation, anemia, cirrhosis, GERD, and hiatal hernia, who presents today for 71-month follow-up appointment.  Admitted to [facility] in February 2024 for acute PE and DVT along left leg.  Repeat echo was negative for evidence of heart strain at the time.  Completed course of Paxlovid during recent COVID-19 virus infection.  Readmitted in March 2024 for worsening weakness and headache with code stroke activated.  MRI of brain revealed small foci of restricted diffusion at cortical frontal parietal cortex bilaterally and neurology was consulted.  During hospital course, was treated empirically for meningeal encephalitis, lumbar puncture was reassuring and not consistent with active infection.  Last seen by [name] An, PA-C on February 18, 2023.  Was overall doing well at the time.  Was awaiting home sleep study that was being arranged by pulmonology.  Was doing well on Eliquis at the time.  Today he presents for 59-month follow-up appointment.  He states he is doing well. Denies any acute changes to his health since last office visit. Denies any chest pain, shortness of breath, palpitations, syncope, presyncope, dizziness, orthopnea, PND, swelling or significant weight changes, acute bleeding, or claudication.  ROS: Negative. See HPI.   Studies Reviewed: .    Echo 01/2023: 1. Technically difficult study with poor visualization of cardiac  structures.   2. Left ventricular ejection fraction, by estimation, is 55 to 60%. The  left ventricle has normal function. The left ventricle has no regional  wall  motion abnormalities. Left ventricular diastolic parameters were  normal.   3. Right ventricular systolic function is mildly reduced. The right  ventricular size is mildly enlarged.   4. The mitral valve is normal in structure. No evidence of mitral valve  regurgitation. No evidence of mitral stenosis.   5. The aortic valve is normal in structure. There is mild calcification  of the aortic valve. Aortic valve regurgitation is not visualized. No  aortic stenosis is present.   6. The inferior vena cava is normal in size with greater than 50%  respiratory variability, suggesting right atrial pressure of 3 mmHg.   LHC 08/2022:   Mid LAD lesion is 90% stenosed.   Ost LAD lesion is 50% stenosed.   Prox Cx lesion is 50% stenosed.   Mid RCA lesion is 40% stenosed.   Dist RCA lesion is 40% stenosed.   Origin to Prox Graft lesion is 40% stenosed.   1st Diag lesion is 80% stenosed.   Ost RPDA lesion is 100% stenosed.   Prox LAD to Mid LAD lesion is 100% stenosed.   1st Mrg lesion is 100% stenosed.   SVG graft was visualized by angiography and is normal in caliber.   LIMA graft was visualized by angiography and is normal in caliber.   RIMA graft was visualized by angiography and is normal in caliber.   Severe triple vessel CAD s/p 3V CABG with 3/3 patent bypass grafts Chronic occlusion mid LAD. The mid and distal LAD fills from the patent LIMA graft Chronic occlusion obtuse marginal branch in the previously stented segment. Patent free RIMA graft  to the obtuse marginal branch Large dominant RCA with mild diffuse disease. The PDA is chronically occluded. Patent SVG to PDA.    Recommendations: Continue medical management of CAD  Physical Exam:   VS:  BP 112/60   Pulse 70   Ht 5\' 9"  (1.753 m)   Wt 201 lb 3.2 oz (91.3 kg)   SpO2 98%   BMI 29.71 kg/m    Wt Readings from Last 3 Encounters:  09/21/23 201 lb 3.2 oz (91.3 kg)  09/07/23 203 lb 4.8 oz (92.2 kg)  08/17/23 199 lb 12.8 oz (90.6  kg)    GEN: Well nourished, well developed in no acute distress NECK: No JVD; No carotid bruits CARDIAC: S1/S2, RRR, no murmurs, rubs, gallops RESPIRATORY:  Clear to auscultation without rales, wheezing or rhonchi  ABDOMEN: Soft, non-tender, non-distended EXTREMITIES:  No edema; No deformity   ASSESSMENT AND PLAN: .    CAD, s/p CABG in 2017 Cardiac cath 08/2022 revealed patent grafts, medical management recommended. Stable with no anginal symptoms. No indication for ischemic evaluation. Continue Coreg, Zetia, Imdur, Pravastatin, and NTG PRN. Heart healthy diet and regular cardiovascular exercise encouraged.   HFpEF Stage C, NYHA class I symptoms. EF 55-60%, 01/2023. Euvolemic and well compensated on exam. Continue current medication regimen. Low sodium diet, fluid restriction <2L, and daily weights encouraged. Educated to contact our office for weight gain of 2 lbs overnight or 5 lbs in one week.  HLD LDL 01/2023 was 36. Continue current medication regimen. Heart healthy diet and regular cardiovascular exercise encouraged.   HTN BP stable. Discussed to monitor BP at home at least 2 hours after medications and sitting for 5-10 minutes. No medication changes at this time. Heart healthy diet and regular cardiovascular exercise encouraged.   Hx of PE/DVT Diangosed 12/2022, was in setting of Covid-19. Being followed by Hem/Onc. Continue Eliquis. He is on appropriate dosage and denies any bleeding issues. Continue to follow with PCP.  Dispo: Follow-up with Dr. Diona [name] or APP in 6 months or sooner if anything changes.  Signed, [name] Dory, NP

## 2023-09-27 ENCOUNTER — Ambulatory Visit (HOSPITAL_BASED_OUTPATIENT_CLINIC_OR_DEPARTMENT_OTHER): Payer: 59 | Attending: Pulmonary Disease | Admitting: Pulmonary Disease

## 2023-09-27 DIAGNOSIS — I493 Ventricular premature depolarization: Secondary | ICD-10-CM | POA: Diagnosis not present

## 2023-09-27 DIAGNOSIS — G4733 Obstructive sleep apnea (adult) (pediatric): Secondary | ICD-10-CM | POA: Insufficient documentation

## 2023-09-28 ENCOUNTER — Telehealth: Payer: Self-pay | Admitting: Pulmonary Disease

## 2023-09-28 DIAGNOSIS — R0683 Snoring: Secondary | ICD-10-CM

## 2023-09-28 DIAGNOSIS — G4733 Obstructive sleep apnea (adult) (pediatric): Secondary | ICD-10-CM

## 2023-09-28 NOTE — Procedures (Signed)
Patient Name: [name] [name], [name] [name] Date: 09/27/2023 Gender: Male D.O.B: 08-22-1956 Age (years): 43 Referring Provider: Cyril Mourning MD, ABSM Height (inches): 69 Interpreting Physician: Cyril Mourning MD, ABSM Weight (lbs): 197 RPSGT: Ulyess Mort BMI: 29 MRN: [id number] Neck Size: 16.00 <br> <br> CLINICAL INFORMATION The patient is referred for a PAP titration to treat sleep apnea.    HST 03/2023 showed severe OSA with AHI 37/ hr & low sat 87% 08/2019 NPSG ([facility]NA ) AHI 44/hour lowest desaturation 80%. 09/2019 events not controlled with CPAP titration, required BiPAP 20/15 , AHI decreased to 8/hour - wt 238 lbs  SLEEP STUDY TECHNIQUE As per the AASM Manual for the Scoring of Sleep and Associated Events v2.3 (April 2016) with a hypopnea requiring 4% desaturations.  The channels recorded and monitored were frontal, central and occipital EEG, electrooculogram (EOG), submentalis EMG (chin), nasal and oral airflow, thoracic and abdominal wall motion, anterior tibialis EMG, snore microphone, electrocardiogram, and pulse oximetry. Bilevel positive airway pressure (BPAP) was initiated at the beginning of the study and titrated to treat sleep-disordered breathing.  MEDICATIONS Medications self-administered by patient taken the night of the study : N/A  RESPIRATORY PARAMETERS Optimal IPAP Pressure (cm): 23 AHI at Optimal Pressure (/hr) 24.6 Optimal EPAP Pressure (cm): 19   Overall Minimal O2 (%): 85.0 Minimal O2 at Optimal Pressure (%): 92.0 SLEEP ARCHITECTURE Start Time: 10:07:16 PM Stop Time: 5:31:51 AM Total Time (min): 444.6 Total Sleep Time (min): 274 Sleep Latency (min): 46.5 Sleep Efficiency (%): 61.6% REM Latency (min): 303.5 WASO (min): 124.1 Stage N1 (%): 23.4% Stage N2 (%): 76.1% Stage N3 (%): 0.0% Stage R (%): 0.6 Supine (%): 88.14 Arousal Index (/hr): 39.4     CARDIAC DATA The 2 lead EKG demonstrated sinus rhythm. The mean heart rate was 69.1 beats per minute. Other EKG  findings include: PVCs.   LEG MOVEMENT DATA The total Periodic Limb Movements of Sleep (PLMS) were 0. The PLMS index was 0.0. A PLMS index of <15 is considered normal in adults.  IMPRESSIONS - A sub optimal optimal PAP pressure was selected for this patient ( 23 / 19 cm of water). CPAP was titrated to 16 cm but events persisted. Bilevel was then applied & titrated to 23/19. Some centrals emerged & REM sleep was not noted. - Central sleep apnea was not noted during this titration (CAI = 2.2/h). - Moderete oxygen desaturations were observed during this titration (min O2 = 85.0%). - The patient snored with moderate snoring volume. - 2-lead EKG demonstrated: PVCs - Clinically significant periodic limb movements were not noted during this study. Arousals associated with PLMs were rare.   DIAGNOSIS - Obstructive Sleep Apnea (G47.33)   RECOMMENDATIONS - Trial of BiPAP therapy on 23/19 cm H2O with a Medium size Fisher&Paykel Full Face Simplus mask and heated humidification. Preferably, auto bilevel should be tried since results were sub optimal at this level - Avoid alcohol, sedatives and other CNS depressants that may worsen sleep apnea and disrupt normal sleep architecture. - Sleep hygiene should be reviewed to assess factors that may improve sleep quality. - Weight management and regular exercise should be initiated or continued. - Return to [facility] Center for re-evaluation after 4 weeks of therapy  [Electronically signed] 09/28/2023 08:14 AM  Cyril Mourning MD, ABSM Diplomate, American Board of Sleep Medicine NPI: [id number]

## 2023-09-28 NOTE — Telephone Encounter (Signed)
Based on titration study, please send Rx for AutoBiPAP EPAP min 10, PS +4, IPAP max 23, med FF mask to DME  OV with me/APP in 6 wks after starting

## 2023-10-04 NOTE — Telephone Encounter (Signed)
DME order placed.

## 2023-10-15 ENCOUNTER — Other Ambulatory Visit: Payer: Self-pay | Admitting: Internal Medicine

## 2023-10-17 ENCOUNTER — Other Ambulatory Visit: Payer: Self-pay

## 2023-10-17 ENCOUNTER — Emergency Department (HOSPITAL_COMMUNITY): Payer: 59

## 2023-10-17 ENCOUNTER — Emergency Department (HOSPITAL_COMMUNITY)
Admission: EM | Admit: 2023-10-17 | Discharge: 2023-10-17 | Disposition: A | Discharge status: Home / Self Care | Payer: 59 | Attending: Emergency Medicine | Admitting: Emergency Medicine

## 2023-10-17 ENCOUNTER — Encounter (HOSPITAL_COMMUNITY): Payer: Self-pay

## 2023-10-17 DIAGNOSIS — Z794 Long term (current) use of insulin: Secondary | ICD-10-CM | POA: Insufficient documentation

## 2023-10-17 DIAGNOSIS — E119 Type 2 diabetes mellitus without complications: Secondary | ICD-10-CM | POA: Insufficient documentation

## 2023-10-17 DIAGNOSIS — M542 Cervicalgia: Secondary | ICD-10-CM | POA: Insufficient documentation

## 2023-10-17 DIAGNOSIS — Z951 Presence of aortocoronary bypass graft: Secondary | ICD-10-CM | POA: Diagnosis not present

## 2023-10-17 DIAGNOSIS — G238 Other specified degenerative diseases of basal ganglia: Secondary | ICD-10-CM | POA: Diagnosis not present

## 2023-10-17 DIAGNOSIS — Z79899 Other long term (current) drug therapy: Secondary | ICD-10-CM | POA: Diagnosis not present

## 2023-10-17 DIAGNOSIS — I110 Hypertensive heart disease with heart failure: Secondary | ICD-10-CM | POA: Diagnosis not present

## 2023-10-17 DIAGNOSIS — I5030 Unspecified diastolic (congestive) heart failure: Secondary | ICD-10-CM | POA: Insufficient documentation

## 2023-10-17 DIAGNOSIS — R519 Headache, unspecified: Secondary | ICD-10-CM | POA: Insufficient documentation

## 2023-10-17 DIAGNOSIS — I2510 Atherosclerotic heart disease of native coronary artery without angina pectoris: Secondary | ICD-10-CM | POA: Insufficient documentation

## 2023-10-17 DIAGNOSIS — W109XXA Fall (on) (from) unspecified stairs and steps, initial encounter: Secondary | ICD-10-CM | POA: Diagnosis not present

## 2023-10-17 DIAGNOSIS — W19XXXA Unspecified fall, initial encounter: Secondary | ICD-10-CM

## 2023-10-17 DIAGNOSIS — M791 Myalgia, unspecified site: Secondary | ICD-10-CM | POA: Diagnosis not present

## 2023-10-17 DIAGNOSIS — M549 Dorsalgia, unspecified: Secondary | ICD-10-CM | POA: Diagnosis not present

## 2023-10-17 DIAGNOSIS — M7918 Myalgia, other site: Secondary | ICD-10-CM | POA: Insufficient documentation

## 2023-10-17 DIAGNOSIS — Z7901 Long term (current) use of anticoagulants: Secondary | ICD-10-CM | POA: Diagnosis not present

## 2023-10-17 DIAGNOSIS — Y9301 Activity, walking, marching and hiking: Secondary | ICD-10-CM | POA: Insufficient documentation

## 2023-10-17 DIAGNOSIS — M51369 Other intervertebral disc degeneration, lumbar region without mention of lumbar back pain or lower extremity pain: Secondary | ICD-10-CM | POA: Diagnosis not present

## 2023-10-17 DIAGNOSIS — M545 Low back pain, unspecified: Secondary | ICD-10-CM | POA: Insufficient documentation

## 2023-10-17 DIAGNOSIS — S0990XA Unspecified injury of head, initial encounter: Secondary | ICD-10-CM | POA: Diagnosis not present

## 2023-10-17 DIAGNOSIS — I6782 Cerebral ischemia: Secondary | ICD-10-CM | POA: Diagnosis not present

## 2023-10-17 DIAGNOSIS — S199XXA Unspecified injury of neck, initial encounter: Secondary | ICD-10-CM | POA: Diagnosis not present

## 2023-10-17 DIAGNOSIS — M19012 Primary osteoarthritis, left shoulder: Secondary | ICD-10-CM | POA: Diagnosis not present

## 2023-10-17 DIAGNOSIS — M25511 Pain in right shoulder: Secondary | ICD-10-CM | POA: Diagnosis not present

## 2023-10-17 DIAGNOSIS — R1310 Dysphagia, unspecified: Secondary | ICD-10-CM | POA: Diagnosis not present

## 2023-10-17 DIAGNOSIS — Z043 Encounter for examination and observation following other accident: Secondary | ICD-10-CM | POA: Diagnosis not present

## 2023-10-17 DIAGNOSIS — S069X9A Unspecified intracranial injury with loss of consciousness of unspecified duration, initial encounter: Secondary | ICD-10-CM | POA: Diagnosis not present

## 2023-10-17 DIAGNOSIS — M19011 Primary osteoarthritis, right shoulder: Secondary | ICD-10-CM | POA: Diagnosis not present

## 2023-10-17 LAB — COMPREHENSIVE METABOLIC PANEL
ALT: 25 U/L (ref 0–44)
AST: 28 U/L (ref 15–41)
Albumin: 3.4 g/dL — ABNORMAL LOW (ref 3.5–5.0)
Alkaline Phosphatase: 42 U/L (ref 38–126)
Anion gap: 7 (ref 5–15)
BUN: 16 mg/dL (ref 8–23)
CO2: 25 mmol/L (ref 22–32)
Calcium: 9.2 mg/dL (ref 8.9–10.3)
Chloride: 107 mmol/L (ref 98–111)
Creatinine, Ser: 1 mg/dL (ref 0.61–1.24)
GFR, Estimated: >60 mL/min (ref >60)
Glucose, Bld: 120 mg/dL — ABNORMAL HIGH (ref 70–99)
Potassium: 4.1 mmol/L (ref 3.5–5.1)
Sodium: 139 mmol/L (ref 135–145)
Total Bilirubin: 0.7 mg/dL (ref <1.2)
Total Protein: 6.5 g/dL (ref 6.5–8.1)

## 2023-10-17 LAB — CBC WITH DIFFERENTIAL/PLATELET
Abs Immature Granulocytes: 0.01 10*3/uL (ref 0.00–0.07)
Basophils Absolute: 0 10*3/uL (ref 0.0–0.1)
Basophils Relative: 0 %
Eosinophils Absolute: 0 10*3/uL (ref 0.0–0.5)
Eosinophils Relative: 2 %
HCT: 39.6 % (ref 39.0–52.0)
Hemoglobin: 13.8 g/dL (ref 13.0–17.0)
Immature Granulocytes: 0 %
Lymphocytes Relative: 33 %
Lymphs Abs: 0.8 10*3/uL (ref 0.7–4.0)
MCH: 31.1 pg (ref 26.0–34.0)
MCHC: 34.8 g/dL (ref 30.0–36.0)
MCV: 89.2 fL (ref 80.0–100.0)
Monocytes Absolute: 0.2 10*3/uL (ref 0.1–1.0)
Monocytes Relative: 9 %
Neutro Abs: 1.3 10*3/uL — ABNORMAL LOW (ref 1.7–7.7)
Neutrophils Relative %: 56 %
Platelets: 67 10*3/uL — ABNORMAL LOW (ref 150–400)
RBC: 4.44 MIL/uL (ref 4.22–5.81)
RDW: 13.8 % (ref 11.5–15.5)
WBC: 2.4 10*3/uL — ABNORMAL LOW (ref 4.0–10.5)
nRBC: 0 % (ref 0.0–0.2)

## 2023-10-17 LAB — URINALYSIS, ROUTINE W REFLEX MICROSCOPIC
Bilirubin Urine: NEGATIVE
Glucose, UA: NEGATIVE mg/dL
Hgb urine dipstick: NEGATIVE
Ketones, ur: NEGATIVE mg/dL
Leukocytes,Ua: NEGATIVE
Nitrite: NEGATIVE
Protein, ur: NEGATIVE mg/dL
Specific Gravity, Urine: 1.014 (ref 1.005–1.030)
pH: 6 (ref 5.0–8.0)

## 2023-10-17 MED ORDER — OXYCODONE-ACETAMINOPHEN 5-325 MG PO TABS: 1.0000 | ORAL_TABLET | Freq: Four times a day (QID) | ORAL | 0 refills | Status: DC | PRN

## 2023-10-17 MED ORDER — OXYCODONE-ACETAMINOPHEN 5-325 MG PO TABS
2.0000 | ORAL_TABLET | Freq: Once | ORAL | Status: AC
  Administered 2023-10-17: 2 via ORAL
  Filled 2023-10-17: qty 2

## 2023-10-17 NOTE — ED Provider Notes (Signed)
[place] EMERGENCY DEPARTMENT AT [facility] Provider Note   CSN: [id number] Arrival date & time: 10/17/23  1250     History  Chief Complaint  Patient presents with   [name] [name] is a 67 y.o. male.  Patient to ED after fall at home yesterday. He tells me he was walking down the stairs and fell backward, hitting the back of his head and neck. He complains mostly of lower cervical pain and expresses concern that he has had surgery in this area. No LOC, nausea or vomiting. He went to work last night and slept today so is now just presenting to the emergency room. He is anticoagulated on Eliquis.   The history is provided by the patient and the spouse. No language interpreter was used.  Fall       Home Medications Prior to Admission medications   Medication Sig Start Date End Date Taking? Authorizing Provider  oxyCODONE-acetaminophen (PERCOCET/ROXICET) 5-325 MG tablet Take 1-2 tablets by mouth every 6 (six) hours as needed for severe pain (pain score 7-10). 10/17/23  Yes Schelly Chuba, Melvenia Beam, PA-C  albuterol (VENTOLIN HFA) 108 (90 Base) MCG/ACT inhaler Inhale 2 puffs into the lungs every 6 (six) hours as needed for wheezing or shortness of breath.    [provider]  apixaban (ELIQUIS) 5 MG TABS tablet Take 1 tablet (5 mg total) by mouth 2 (two) times daily. 08/04/23   Doreatha Massed, MD  carvedilol (COREG) 6.25 MG tablet Take 1 tablet (6.25 mg total) by mouth 2 (two) times daily with a meal. 06/29/23   Marguerita Merles, Reuel Boom, MD  citalopram (CELEXA) 20 MG tablet TAKE 1 TABLET BY MOUTH AT BEDTIME. 09/20/23   [name], MD  Continuous Blood Gluc Receiver (FREESTYLE LIBRE 2 READER) DEVI 1 Device by Does not apply route continuous. 12/31/22   [name], MD  Continuous Blood Gluc Sensor (FREESTYLE LIBRE 2 SENSOR) MISC Apply one sensor every 14 days 12/31/22   [name], MD  cyclobenzaprine (FLEXERIL) 10 MG tablet TAKE 1 TABLET BY MOUTH  THREE TIMES DAILY AS NEEDED FOR MUSCLE SPASMS 02/12/23   [name], MD  ezetimibe (ZETIA) 10 MG tablet TAKE 1 TABLET BY MOUTH ONCE DAILY 07/22/23   [name], MD  FARXIGA 5 MG TABS tablet TAKE 1 TABLET BY MOUTH DAILY BEFORE BREAKFAST 08/04/23   [name], MD  fluorouracil (EFUDEX) 5 % cream Apply topically 2 (two) times daily. 09/07/23   Gwenith Daily, MD  fluticasone (FLONASE) 50 MCG/ACT nasal spray Place 2 sprays into both nostrils 2 (two) times daily. 07/09/23   [name], MD  fluticasone-salmeterol (ADVAIR) 250-50 MCG/ACT AEPB INHALE 1 PUFF BY MOUTH TWICE DAILY 07/22/23   [name], MD  gabapentin (NEURONTIN) 300 MG capsule TAKE 1 CAPSULE BY MOUTH THREE TIMES DAILY 05/31/23   [name], MD  isosorbide mononitrate (IMDUR) 60 MG 24 hr tablet TAKE 1 TABLET (60MG ) BY MOUTH IN THE MORNING AND 1/2 TABLET (30MG ) IN THE EVENING 10/15/23   [name], MD  Melatonin 10 MG TABS Take 10 mg by mouth at bedtime.    [provider]  nitroGLYCERIN (NITROSTAT) 0.4 MG SL tablet DISSOLVE 1 TABLET UNDER THE TONGUE AS NEEDED FOR CHEST PAIN EVERY 5 MINUTES UP TO 3 TIMES. IF NO RELIEF CALL 911. 05/24/23   [name], MD  omeprazole (PRILOSEC) 40 MG capsule Take 1 capsule (40 mg total) by mouth  daily. 08/02/23   [name], MD  pravastatin (PRAVACHOL) 20 MG tablet TAKE 1 TABLET BY MOUTH DAILY. 08/20/23   [name], MD  tirzepatide [facility]) 5 MG/0.5ML Pen Inject 5 mg into the skin once a week. 08/02/23   [name], MD  zonisamide (ZONEGRAN) 100 MG capsule Take 100 mg by mouth daily. 01/19/23   [provider]      Allergies    Divalproex sodium, Statins, Tramadol, Valproic acid, Gadolinium derivatives, and Tricor [fenofibrate]    Review of Systems   Review of Systems  Physical Exam Updated Vital Signs BP (!) 140/77   Pulse 68   Temp 98.2 F (36.8 C) (Oral)   Resp 16   Ht 5\' 9"  (1.753 m)   Wt 92.1 kg   SpO2 98%   BMI 29.98  kg/m  Physical Exam Vitals and nursing note reviewed.  Constitutional:      General: He is not in acute distress.    Appearance: Normal appearance.  HENT:     Head: Normocephalic and atraumatic.  Eyes:     Pupils: Pupils are equal, round, and reactive to light.  Neck:   Cardiovascular:     Rate and Rhythm: Normal rate and regular rhythm.     Heart sounds: No murmur heard. Pulmonary:     Effort: Pulmonary effort is normal.  Abdominal:     General: Abdomen is flat. There is no distension.     Palpations: Abdomen is soft.     Tenderness: There is no abdominal tenderness.  Musculoskeletal:     Cervical back: Normal range of motion and neck supple.  Skin:    General: Skin is warm and dry.     Findings: No bruising or erythema.  Neurological:     Mental Status: He is alert and oriented to person, place, and time.     ED Results / Procedures / Treatments   Labs (all labs ordered are listed, but only abnormal results are displayed) Labs Reviewed  CBC WITH DIFFERENTIAL/PLATELET - Abnormal; Notable for the following components:      Result Value   WBC 2.4 (*)    Platelets 67 (*)    Neutro Abs 1.3 (*)    All other components within normal limits  COMPREHENSIVE METABOLIC PANEL - Abnormal; Notable for the following components:   Glucose, Bld 120 (*)    Albumin 3.4 (*)    All other components within normal limits  URINALYSIS, ROUTINE W REFLEX MICROSCOPIC    EKG None  Radiology MR BRAIN WO CONTRAST  Result Date: 10/17/2023 CLINICAL DATA:  Head trauma, minor (Age >= 65y) EXAM: MRI HEAD WITHOUT CONTRAST TECHNIQUE: Multiplanar, multiecho pulse sequences of the brain and surrounding structures were obtained without intravenous contrast. COMPARISON:  Head CT 10/17/2023, brain MR 01/31/2023 FINDINGS: Brain: No acute infarction, hemorrhage, hydrocephalus, extra-axial collection or mass lesion. There is a background of mild chronic microvascular ischemic change. 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 Skull and upper cervical spine: Normal marrow signal. Sinuses/Orbits: Negative. Other: None. IMPRESSION: No acute intracranial abnormality. Electronically Signed   By: Lorenza Cambridge M.D.   On: 10/17/2023 17:31   DG Shoulder Right  Result Date: 10/17/2023 CLINICAL DATA:  Fall with right shoulder pain EXAM: RIGHT SHOULDER - 2+ VIEW COMPARISON:  Chest radiograph dated 02/09/2023. FINDINGS: There is no evidence of fracture or dislocation. There are degenerative changes of the inferior aspect of the glenoid  as well as the acromioclavicular joint. Soft tissues are unremarkable. IMPRESSION: No acute fracture or dislocation. Electronically Signed   By: Romona Curls M.D.   On: 10/17/2023 14:00   DG Shoulder Left  Result Date: 10/17/2023 CLINICAL DATA:  Fall with left shoulder EXAM: LEFT SHOULDER - 2+ VIEW COMPARISON:  Chest radiograph dated 02/09/2023 FINDINGS: There is no evidence of fracture or dislocation. There are degenerative changes of the inferior aspect of the glenoid as well as the acromioclavicular joint. Soft tissues are unremarkable. IMPRESSION: No acute fracture or dislocation. Electronically Signed   By: Romona Curls M.D.   On: 10/17/2023 13:59   DG Lumbar Spine Complete  Result Date: 10/17/2023 CLINICAL DATA:  Fall, back pain. EXAM: LUMBAR SPINE - COMPLETE 4+ VIEW COMPARISON:  CT lumbar spine 12/12/2021 FINDINGS: There is no evidence of lumbar spine fracture. Alignment is normal. Mild multilevel degenerative disc and joint disease. IMPRESSION: No acute fracture or traumatic malalignment. Electronically Signed   By: Romona Curls M.D.   On: 10/17/2023 13:57   CT Head Wo Contrast  Result Date: 10/17/2023 CLINICAL DATA:  Head trauma, GCS=15, loss of consciousness (LOC) (Ped 0-17y); Neck trauma (Age >= 65y) EXAM: CT HEAD WITHOUT CONTRAST CT CERVICAL SPINE WITHOUT CONTRAST TECHNIQUE: Multidetector CT imaging of the head and  cervical spine was performed following the standard protocol without intravenous contrast. Multiplanar CT image reconstructions of the cervical spine were also generated. RADIATION DOSE REDUCTION: This exam was performed according to the departmental dose-optimization program which includes automated exposure control, adjustment of the mA and/or kV according to patient size and/or use of iterative reconstruction technique. COMPARISON:  None Available. FINDINGS: CT HEAD FINDINGS Brain: No hemorrhage. No hydrocephalus. No extra-axial fluid collection. No CT evidence of an acute cortical infarct. No mass effect. No mass lesion. Mineralization of the basal ganglia bilaterally. Vascular: No hyperdense vessel or unexpected calcification. Skull: Normal. Negative for fracture or focal lesion. Sinuses/Orbits: No middle ear or mastoid effusion. Mild mucosal thickening of the floor of the bilateral maxillary sinuses. Bilateral lens replacement. Orbits are otherwise unremarkable. Other: None. CT CERVICAL SPINE FINDINGS Alignment: Normal. Skull base and vertebrae: No acute fracture. No primary bone lesion or focal pathologic process. There is fusion of C3-C4 Soft tissues and spinal canal: No prevertebral fluid or swelling. No visible canal hematoma. Disc levels:  No evidence of high-grade spinal canal stenosis Upper chest: Negative. Other: None IMPRESSION: 1. No CT evidence of intracranial injury. 2. No acute fracture or traumatic subluxation of the cervical spine. Electronically Signed   By: Lorenza Cambridge M.D.   On: 10/17/2023 13:22   CT Cervical Spine Wo Contrast  Result Date: 10/17/2023 CLINICAL DATA:  Head trauma, GCS=15, loss of consciousness (LOC) (Ped 0-17y); Neck trauma (Age >= 65y) EXAM: CT HEAD WITHOUT CONTRAST CT CERVICAL SPINE WITHOUT CONTRAST TECHNIQUE: Multidetector CT imaging of the head and cervical spine was performed following the standard protocol without intravenous contrast. Multiplanar CT image  reconstructions of the cervical spine were also generated. RADIATION DOSE REDUCTION: This exam was performed according to the departmental dose-optimization program which includes automated exposure control, adjustment of the mA and/or kV according to patient size and/or use of iterative reconstruction technique. COMPARISON:  None Available. FINDINGS: CT HEAD FINDINGS Brain: No hemorrhage. No hydrocephalus. No extra-axial fluid collection. No CT evidence of an acute cortical infarct. No mass effect. No mass lesion. Mineralization of the basal ganglia bilaterally. Vascular: No hyperdense vessel or unexpected calcification. Skull: Normal. Negative for fracture or focal  lesion. Sinuses/Orbits: No middle ear or mastoid effusion. Mild mucosal thickening of the floor of the bilateral maxillary sinuses. Bilateral lens replacement. Orbits are otherwise unremarkable. Other: None. CT CERVICAL SPINE FINDINGS Alignment: Normal. Skull base and vertebrae: No acute fracture. No primary bone lesion or focal pathologic process. There is fusion of C3-C4 Soft tissues and spinal canal: No prevertebral fluid or swelling. No visible canal hematoma. Disc levels:  No evidence of high-grade spinal canal stenosis Upper chest: Negative. Other: None IMPRESSION: 1. No CT evidence of intracranial injury. 2. No acute fracture or traumatic subluxation of the cervical spine. Electronically Signed   By: Lorenza Cambridge M.D.   On: 10/17/2023 13:22    Procedures Procedures    Medications Ordered in ED Medications  oxyCODONE-acetaminophen (PERCOCET/ROXICET) 5-325 MG per tablet 2 tablet (2 tablets Oral Given 10/17/23 1418)    ED Course/ Medical Decision Making/ A&P                                 Medical Decision Making This patient presents to the ED for concern of fall, this involves an extensive number of treatment options, and is a complaint that carries with it a high risk of complications and morbidity.  The differential diagnosis  includes Intracranial head injury/bleed, skull fracture, cervical fracture, stroke, cardiogenic syncope   Co morbidities that complicate the patient evaluation  PMH: CAD, s/p CABG in 2017, hyperlipidemia, type 2 diabetes, hypertension, HFpEF, history of PE/DVT (Dx in 12/2022), aortic regurgitation, anemia, cirrhosis, GERD, and hiatal hernia   Additional history obtained:  Additional history and/or information obtained from chart review, notable for Wife at bedside;  - 05/24 - MRI of brain revealed small foci of restricted diffusion at cortical frontal parietal cortex bilaterally   Lab Tests:  I Ordered, and personally interpreted labs.  The pertinent results include:   CBC showing neutropenia, c/w previous history   Imaging Studies ordered:  I ordered imaging studies including CT head and neck, shoulders bilaterally, lumbar spine Radiologist interpretation: Neg head and neck CT, neg plain films of shoulders and lumbar spine     Medicines ordered and prescription drug management:  I ordered medication including oxycodone (2 tablets)  for pain Reevaluation of the patient after these medicines showed that the patient improved I have reviewed the patients home medicines and have made adjustments as needed   Test Considered:  MRI brain: The patient reports he is having trouble swallowing since the fall. He can swallow liquids but solids cause gagging and require liquids to pass into esophagus. MRI felt indicated with this symptom as well, he cannot say why he fell yesterday - doesn't remember what made him fall.    Consultations Obtained:  I requested consultation with the ED attending, Dr. Particia Nearing,  and discussed lab and imaging findings as well as pertinent plan - they recommend: MRI recommended with swallowing difficulty. If negative, ok to discharge home.    Problem List / ED Course:  Fall at home - neck and back pain Anticoagulated.  No chest or abdominal injury Plain  film imaging - neg lumbar, shoulders; neg CT head and neck. MRI added due to subjective difficulty swallowing (managing secretions) - neg Tolerating PO's in the room   Reevaluation:  After the interventions noted above, I reevaluated the patient and found that they have :improved   Social Determinants of Health:  Good family support Independently functioning   Disposition:  After  consideration of the diagnostic results and the patients response to treatment, I feel that the patient would benefit from [facility] to discharge home. Will provide pain medications to be comfortable at home. PCP follow up.   Amount and/or Complexity of Data Reviewed Labs: ordered. Radiology: ordered.  Risk Prescription drug management.           Final Clinical Impression(s) / ED Diagnoses Final diagnoses:  Fall, initial encounter  Musculoskeletal pain    Rx / DC Orders ED Discharge Orders          Ordered    oxyCODONE-acetaminophen (PERCOCET/ROXICET) 5-325 MG tablet  Every 6 hours PRN        10/17/23 1756              Elpidio Anis, PA-C 10/17/23 1809    Jacalyn Lefevre, MD 10/20/23 1537

## 2023-10-17 NOTE — ED Triage Notes (Signed)
Fell yesterday  Down 3 steps backwards Landed on neck and back  Neck pain, Shoulder pain, lumbar pain and rib pain  Pt takes eliquis

## 2023-10-17 NOTE — Discharge Instructions (Addendum)
Follow up with your doctor if pain persists.   Take oxycodone as directed for pain. You should avoid taking ibuprofen.

## 2023-10-18 ENCOUNTER — Telehealth: Payer: Self-pay

## 2023-10-18 NOTE — Transitions of Care (Post Inpatient/ED Visit) (Signed)
10/18/2023  Name: [name] [name] MRN: [id number] DOB: 08/04/1956  Today's TOC FU Call Status: Today's TOC FU Call Status:: Successful TOC FU Call Completed TOC FU Call Complete Date: 10/18/23 Patient's Name and Date of Birth confirmed.  Transition Care Management Follow-up Telephone Call Date of Discharge: 10/17/23 [facility] Type of Discharge: Emergency Department Primary Inpatient Discharge Diagnosis:: fall, initial encounter Reason for ED Visit: Other: (fall) How have you been since you were released from the hospital?: Same Any questions or concerns?: Yes Patient Questions/Concerns:: still having dizziness//message sent to provider Patient Questions/Concerns Addressed: Notified Provider of Patient Questions/Concerns  Items Reviewed: Did you receive and understand the discharge instructions provided?: Yes Medications obtained,verified, and reconciled?: Yes (Medications Reviewed) Any new allergies since your discharge?: No Dietary orders reviewed?: Yes Type of Diet Ordered:: as before Do you have support at home?: Yes People in Home: spouse, child(ren), adult  Medications Reviewed Today: Medications Reviewed Today     Reviewed by Neveyah Garzon, Jordan Hawks, CMA (Certified Medical Assistant) on 10/18/23 at 1523  Med List Status: <None>   Medication Order Taking? Sig Documenting Provider Last Dose Status Informant  albuterol (VENTOLIN HFA) 108 (90 Base) MCG/ACT inhaler [id number] No Inhale 2 puffs into the lungs every 6 (six) hours as needed for wheezing or shortness of breath. [provider] Taking Active Self, Pharmacy Records  apixaban (ELIQUIS) 5 MG TABS tablet [id number] No Take 1 tablet (5 mg total) by mouth 2 (two) times daily. Doreatha Massed, MD Taking Active   carvedilol (COREG) 6.25 MG tablet [id number] No Take 1 tablet (6.25 mg total) by mouth 2 (two) times daily with a meal. Marguerita Merles, Daniel, MD Taking Active   citalopram  (CELEXA) 20 MG tablet [id number] No TAKE 1 TABLET BY MOUTH AT BEDTIME. [name], MD Taking Active   Continuous Blood Gluc Receiver (FREESTYLE LIBRE 2 READER) DEVI [id number] No 1 Device by Does not apply route continuous. [name], MD Taking Active Self, Pharmacy Records  Continuous Blood Gluc Sensor ([address] [facility] 2 SENSOR) [place] [id number] No Apply one sensor every 14 days [name], MD Taking Active Self, Pharmacy Records  cyclobenzaprine (FLEXERIL) 10 MG tablet [id number] No TAKE 1 TABLET BY MOUTH THREE TIMES DAILY AS NEEDED FOR MUSCLE SPASMS [name], MD Taking Active   ezetimibe (ZETIA) 10 MG tablet [id number] No TAKE 1 TABLET BY MOUTH ONCE DAILY [name], MD Taking Active   FARXIGA 5 MG TABS tablet [id number] No TAKE 1 TABLET BY MOUTH DAILY BEFORE BREAKFAST [name], MD Taking Active   fluorouracil (EFUDEX) 5 % cream [id number] No Apply topically 2 (two) times daily. Gwenith Daily, MD Taking Active   fluticasone [facility]) 50 MCG/ACT nasal spray [id number] No Place 2 sprays into both nostrils 2 (two) times daily. [name], MD Taking Active   fluticasone-salmeterol (ADVAIR) 250-50 MCG/ACT AEPB [id number] No INHALE 1 PUFF BY MOUTH TWICE DAILY [name], MD Taking Active   gabapentin (NEURONTIN) 300 MG capsule [id number] No TAKE 1 CAPSULE BY MOUTH THREE TIMES DAILY [name], MD Taking Active   isosorbide mononitrate (IMDUR) 60 MG 24 hr tablet [id number]  TAKE 1 TABLET (60MG ) BY MOUTH IN THE MORNING AND 1/2 TABLET (30MG ) IN THE Dolores Lory, MD  Active   Melatonin 10 MG TABS [id number] No Take 10 mg by mouth at bedtime. [provider] Taking Active Self, Pharmacy Records  nitroGLYCERIN (NITROSTAT) 0.4 MG SL tablet [id number]  No DISSOLVE 1 TABLET UNDER THE TONGUE AS NEEDED FOR CHEST PAIN EVERY 5 MINUTES UP TO 3 TIMES. IF NO RELIEF CALL 911. [name], MD Taking Active   omeprazole (PRILOSEC) 40 MG capsule [id number]  No Take 1 capsule (40 mg total) by mouth daily. [name], MD Taking Active   oxyCODONE-acetaminophen (PERCOCET/ROXICET) 5-325 MG tablet [id number]  Take 1-2 tablets by mouth every 6 (six) hours as needed for severe pain (pain score 7-10). Elpidio Anis, PA-C  Active   pravastatin (PRAVACHOL) 20 MG tablet [id number] No TAKE 1 TABLET BY MOUTH DAILY. [name], MD Taking Active   tirzepatide [facility]) 5 MG/0.5ML Pen [id number] No Inject 5 mg into the skin once a week. [name], MD Taking Active   zonisamide [facility]) 100 MG capsule [id number] No Take 100 mg by mouth daily. [provider] Taking Active Self            Home Care and Equipment/Supplies: Were Home Health Services Ordered?: NA Any new equipment or medical supplies ordered?: NA  Functional Questionnaire: Do you need assistance with bathing/showering or dressing?: No Do you need assistance with meal preparation?: No Do you need assistance with eating?: No Do you have difficulty maintaining continence: No Do you need assistance with getting out of bed/getting out of a chair/moving?: No Do you have difficulty managing or taking your medications?: No  Follow up appointments reviewed: PCP Follow-up appointment confirmed?: Yes Date of PCP follow-up appointment?: 10/20/23 Follow-up Provider: [facility] Follow-up appointment confirmed?: NA Do you need transportation to your follow-up appointment?: No Do you understand care options if your condition(s) worsen?: Yes-patient verbalized understanding    Abby Benedicta Sultan, CMA  [facility] AWV Team Direct Dial: [phone]

## 2023-10-19 ENCOUNTER — Ambulatory Visit: Payer: 59 | Admitting: Dermatology

## 2023-10-19 ENCOUNTER — Encounter: Payer: Self-pay | Admitting: Dermatology

## 2023-10-19 DIAGNOSIS — Z872 Personal history of diseases of the skin and subcutaneous tissue: Secondary | ICD-10-CM

## 2023-10-19 DIAGNOSIS — L570 Actinic keratosis: Secondary | ICD-10-CM

## 2023-10-19 DIAGNOSIS — W908XXA Exposure to other nonionizing radiation, initial encounter: Secondary | ICD-10-CM

## 2023-10-19 NOTE — Progress Notes (Signed)
   Follow-Up Visit   Subjective  [name] [name] is a 67 y.o. male who presents for the following: Follow up for use of efudex for Aks on the scalp, forehead and cheeks.  The following portions of the chart were reviewed this encounter and updated as appropriate: medications, allergies, medical history  Review of Systems:  No other skin or systemic complaints except as noted in HPI or Assessment and Plan.  Objective  Well appearing patient in no apparent distress; mood and affect are within normal limits.   A focused examination was performed of the following areas:   Scalp, forehead, and cheeks  Relevant exam findings are noted in the Assessment and Plan.   Assessment & Plan   ACTINIC KERATOSIS- resolved s/p 5 topical  Exam: Erythematous thin papules/macules with gritty scale at the left temple and right cheek  Actinic keratoses are precancerous spots that appear secondary to cumulative UV radiation exposure/sun exposure over time. They are chronic with expected duration over 1 year. A portion of actinic keratoses will progress to squamous cell carcinoma of the skin. It is not possible to reliably predict which spots will progress to skin cancer and so treatment is recommended to prevent development of skin cancer.  Recommend daily broad spectrum sunscreen SPF 30+ to sun-exposed areas, reapply every 2 hours as needed.  Recommend staying in the shade or wearing long sleeves, sun glasses (UVA+UVB protection) and wide brim hats (4-inch brim around the entire circumference of the hat). Call for new or changing lesions.  Treatment Plan: - cryotherapy today  Destruction Procedure Note Destruction method: cryotherapy   Informed consent: discussed and consent obtained   Lesion destroyed using liquid nitrogen: Yes   Outcome: patient tolerated procedure well with no complications   Post-procedure details: wound care instructions given   Locations: 2 # of Lesions Treated: 2  Prior to  procedure, discussed risks of blister formation, small wound, skin dyspigmentation, or rare scar following cryotherapy. Recommend Vaseline ointment to treated areas while healing.   Return in about 4 months (around 02/17/2024).  Dominga Ferry, Surg Tech III, am acting as scribe for Gwenith Daily, MD.   Documentation: I have reviewed the above documentation for accuracy and completeness, and I agree with the above.  Gwenith Daily, MD

## 2023-10-20 ENCOUNTER — Telehealth: Payer: Self-pay | Admitting: Pulmonary Disease

## 2023-10-20 ENCOUNTER — Encounter: Payer: Self-pay | Admitting: Family Medicine

## 2023-10-20 ENCOUNTER — Ambulatory Visit (INDEPENDENT_AMBULATORY_CARE_PROVIDER_SITE_OTHER): Payer: 59 | Admitting: Family Medicine

## 2023-10-20 VITALS — BP 122/72 | HR 66 | Ht 69.0 in | Wt 201.1 lb

## 2023-10-20 DIAGNOSIS — W19XXXD Unspecified fall, subsequent encounter: Secondary | ICD-10-CM

## 2023-10-20 DIAGNOSIS — M542 Cervicalgia: Secondary | ICD-10-CM

## 2023-10-20 MED ORDER — OXYCODONE-ACETAMINOPHEN 5-325 MG PO TABS: 1.0000 | ORAL_TABLET | Freq: Four times a day (QID) | ORAL | 0 refills | Status: DC | PRN

## 2023-10-20 NOTE — Progress Notes (Signed)
Established Patient Office Visit  Subjective:  Patient ID: [name] [name], male    DOB: June 15, 1956  Age: 67 y.o. MRN: [id number]  CC:  Chief Complaint  Patient presents with   Follow-up    Following up from ED from 10/17/23, c/o dizziness during toc call. Reports neck pain still present.    HPI [name] [name] is a 67 y.o. male with past medical history of degenerative disc disease, cervical, aortic insufficiency, chronic low back pain, essential hypertension, anxiety, lumbar herniated disc, thrombocytopenia presents for ED f/u.  Fall: The patient was seen in the Emergency Department on 10/17/2023 after sustaining a fall, hitting the back of his head and neck. He denied loss of consciousness, nausea, or vomiting. A CT scan of the head, cervical spine, and x-rays of the lumbar spine, as well as both right and left shoulders, were unremarkable. An MRI of the brain also showed no acute findings. Labs and urinalysis were unremarkable.The patient was discharged home with Percocet for pain relief. Today, he reports neck pain similar to what he experienced before the fall. He has a history of degenerative disc disease of the cervical spine, cervical radiculopathy, and rates his current pain as 7 out of 10. He is taking Percocet as prescribed.The patient is under the care of [facility], seeing Dr. Julio Sicks, and is scheduled for a follow-up on November 23, 2023. He works as a Electrical engineer and complains of increased neck pain with activity. He also reports dizziness when bending over, sometimes feeling a spinning sensation, but denies lightheadedness or feelings of fainting.The patient notes that his blood pressure has been low recently, with systolic readings in the 100s and diastolic readings in the 60s.     Past Medical History:  Diagnosis Date   Anxiety    Aortic insufficiency    Arthritis    Asthma    Cataract    Right eye   Chronic lower back pain    Cirrhosis of  liver (HCC)    Coronary atherosclerosis of native coronary artery    a. 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   LIMA to LAD, free RIMA to OM2, SVG to PDA, open SVG harvest from right thigh   Stroke (HCC)    Thrombocytopenia (HCC)    TIA (transient ischemic attack)    Type 2 diabetes mellitus (HCC)     Past Surgical History:  Procedure Laterality Date   ANTERIOR CERVICAL DECOMP/DISCECTOMY FUSION  1998   "C3-4"   CARDIAC CATHETERIZATION  "several"   CARDIAC CATHETERIZATION N/A 12/30/2015   Procedure: Left Heart Cath and Coronary Angiography;  Surgeon: Runell Gess, MD;  Location: [facility];  Service: Cardiovascular;  Laterality: N/A;   CARPAL TUNNEL RELEASE Bilateral 2005   CHOLECYSTECTOMY N/A 11/28/2018   Procedure: LAPAROSCOPIC CHOLECYSTECTOMY;  Surgeon: Lucretia Roers, MD;  Location: [facility] ORS;  Service: General;  Laterality: N/A;   COLONOSCOPY     COLONOSCOPY WITH PROPOFOL N/A 09/27/2020   Procedure: COLONOSCOPY  WITH PROPOFOL;  Surgeon: Marguerita Merles, Reuel Boom, MD;  Location: [facility];  Service: Gastroenterology;  Laterality: N/A;   COLONOSCOPY WITH PROPOFOL N/A 06/29/2023   Procedure: COLONOSCOPY WITH PROPOFOL;  Surgeon: Dolores Frame, MD;  Location: [facility];  Service: Gastroenterology;  Laterality: N/A;  1:30PM;ASA 3   CORONARY ANGIOPLASTY     CORONARY ANGIOPLASTY WITH STENT PLACEMENT  2002; 2003; 11/20/2014   "I have 4 stents after today" (11/20/2014)   CORONARY ARTERY BYPASS GRAFT N/A 01/09/2016   Procedure: CORONARY ARTERY BYPASS GRAFTING (CABG) X 3 UTILIZING RIGHT AND LEFT  INTERNAL MAMMARY ARTERY AND ENDOSCOPICALLY HARVESTED SAPHENEOUS VEIN.;  Surgeon: Purcell Nails, MD;  Location: [facility] OR;  Service: Open Heart Surgery;  Laterality: N/A;   ESOPHAGOGASTRODUODENOSCOPY     ESOPHAGOGASTRODUODENOSCOPY (EGD) WITH PROPOFOL N/A 09/27/2020   Procedure: ESOPHAGOGASTRODUODENOSCOPY (EGD) WITH PROPOFOL;  Surgeon: Dolores Frame, MD;  Location: [facility];  Service: Gastroenterology;  Laterality: N/A;  10   ESOPHAGOGASTRODUODENOSCOPY (EGD) WITH PROPOFOL N/A 06/29/2023   Procedure: ESOPHAGOGASTRODUODENOSCOPY (EGD) WITH PROPOFOL;  Surgeon: Dolores Frame, MD;  Location: [facility];  Service: Gastroenterology;  Laterality: N/A;  1:30PM;ASA 3   KNEE SURGERY Left 02/2012   "scraped; open"   LEFT HEART CATH AND CORS/GRAFTS ANGIOGRAPHY N/A 08/09/2017   Procedure: LEFT HEART CATH AND CORS/GRAFTS ANGIOGRAPHY;  Surgeon: Yvonne Kendall, MD;  [facility]: [facility];  Service: Cardiovascular;  Laterality: N/A;   LEFT HEART CATH AND CORS/GRAFTS ANGIOGRAPHY N/A 11/22/2018   Procedure: LEFT HEART CATH AND CORS/GRAFTS ANGIOGRAPHY;  Surgeon: Swaziland, [name], MD;  [facility] INVASIVE CV LAB;  Service: Cardiovascular;  Laterality: N/A;   LEFT HEART CATH AND CORS/GRAFTS ANGIOGRAPHY N/A 08/20/2022   Procedure: LEFT HEART CATH AND CORS/GRAFTS ANGIOGRAPHY;  Surgeon: Kathleene Hazel, MD;  Location: [facility];  Service: Cardiovascular;  Laterality: N/A;   LEFT HEART CATHETERIZATION WITH CORONARY ANGIOGRAM N/A 07/20/2012   Procedure: LEFT HEART CATHETERIZATION WITH CORONARY ANGIOGRAM;  Surgeon: Iran Ouch, MD;  Location: [facility] CATH [facility];  Service: Cardiovascular;  Laterality: N/A;   LEFT HEART CATHETERIZATION WITH CORONARY ANGIOGRAM N/A 11/20/2014   Procedure: LEFT HEART CATHETERIZATION WITH CORONARY ANGIOGRAM;  Surgeon: [name] Swaziland, MD;  Location: [facility] CATH LAB;  Service: Cardiovascular;  Laterality: N/A;   LEFT HEART CATHETERIZATION WITH CORONARY  ANGIOGRAM N/A 11/26/2014   Procedure: LEFT HEART CATHETERIZATION WITH CORONARY ANGIOGRAM;  Surgeon: [name] Swaziland, MD;  Location: [facility] CATH LAB;  Service: Cardiovascular;  Laterality: N/A;   NEUROPLASTY / TRANSPOSITION ULNAR NERVE AT ELBOW Right ~ 2012   PERCUTANEOUS CORONARY ROTOBLATOR INTERVENTION (PCI-R)  11/20/2014   Procedure: PERCUTANEOUS CORONARY ROTOBLATOR INTERVENTION (PCI-R);  Surgeon: [name] Swaziland, MD;  Location: [facility] CATH LAB;  Service: Cardiovascular;;   POSTERIOR CERVICAL LAMINECTOMY Left 04/16/2022   Procedure: Laminectomy and Foraminotomy - left - C6-C7;  Surgeon: Julio Sicks, MD;  Location: [facility] OR;  Service: Neurosurgery;  Laterality: Left;  3C   SHOULDER ARTHROSCOPY Left ~ 2011   TEE WITHOUT CARDIOVERSION N/A 01/09/2016   Procedure: TRANSESOPHAGEAL ECHOCARDIOGRAM (TEE);  Surgeon: Purcell Nails, MD;  Location: [facility] OR;  Service: Open Heart Surgery;  Laterality: N/A;    Family History  Problem Relation Age of Onset   Stroke Mother    Coronary artery disease Father 54   Asthma Sister    Multiple sclerosis Brother    Heart disease Paternal Uncle    Stroke Maternal Grandmother    Heart attack Paternal Grandmother  Cancer Paternal Grandfather    Asthma Sister    Seizures Son    Migraines Son    Autism Son    Migraines Son     Social History   Socioeconomic History   Marital status: Married    Spouse name: [name]   Number of children: 3   Years of education: 12 th    Highest education level: Not on file  Occupational History   Occupation: Unemployed  Tobacco Use   Smoking status: Never    Passive exposure: Current   Smokeless tobacco: Never  Vaping Use   Vaping status: Never Used  Substance and Sexual Activity   Alcohol use: No   Drug use: No   Sexual activity: Not on file  Other Topics Concern   Not on file  Social History Narrative   Patient lives at home with wife [name].   Patient works at [employer], Social research officer, government.   Patient has a 12 th  grade education.    Patient has 3 children.       Social Determinants of Health   Financial Resource Strain: Low Risk  (02/03/2023)   Overall Financial Resource Strain (CARDIA)    Difficulty of Paying Living Expenses: Not hard at all  Food Insecurity: No Food Insecurity (02/03/2023)   Hunger Vital Sign    Worried About Running Out of Food in the Last Year: Never true    Ran Out of Food in the Last Year: Never true  Transportation Needs: No Transportation Needs (02/03/2023)   PRAPARE - Administrator, Civil Service (Medical): No    Lack of Transportation (Non-Medical): No  Physical Activity: Sufficiently Active (11/23/2022)   Exercise Vital Sign    Days of Exercise per Week: 7 days    Minutes of Exercise per Session: 30 min  Stress: No Stress Concern Present (11/23/2022)   [employer] of Occupational Health - Occupational Stress Questionnaire    Feeling of Stress : Not at all  Social Connections: Unknown (03/22/2022)   [facility] from [facility]   Social Network    Social Network: Not on file  Intimate Partner Violence: Not At Risk (01/07/2023)   Humiliation, Afraid, Rape, and Kick questionnaire    Fear of Current or Ex-Partner: No    Emotionally Abused: No    Physically Abused: No    Sexually Abused: No    Outpatient Medications Prior to Visit  Medication Sig Dispense Refill   albuterol (VENTOLIN HFA) 108 (90 Base) MCG/ACT inhaler Inhale 2 puffs into the lungs every 6 (six) hours as needed for wheezing or shortness of breath.     apixaban (ELIQUIS) 5 MG TABS tablet Take 1 tablet (5 mg total) by mouth 2 (two) times daily. 60 tablet 6   carvedilol (COREG) 6.25 MG tablet Take 1 tablet (6.25 mg total) by mouth 2 (two) times daily with a meal. 180 tablet 3   citalopram (CELEXA) 20 MG tablet TAKE 1 TABLET BY MOUTH AT BEDTIME. 30 tablet 10   Continuous Blood Gluc Receiver (FREESTYLE LIBRE 2 READER) DEVI 1 Device by Does not apply route continuous. 1 each 0    Continuous Blood Gluc Sensor (FREESTYLE LIBRE 2 SENSOR) MISC Apply one sensor every 14 days 6 each 3   cyclobenzaprine (FLEXERIL) 10 MG tablet TAKE 1 TABLET BY MOUTH THREE TIMES DAILY AS NEEDED FOR MUSCLE SPASMS 30 tablet 10   ezetimibe (ZETIA) 10 MG tablet TAKE 1 TABLET BY MOUTH ONCE DAILY 30 tablet  10   FARXIGA 5 MG TABS tablet TAKE 1 TABLET BY MOUTH DAILY BEFORE BREAKFAST 30 tablet 10   fluorouracil (EFUDEX) 5 % cream Apply topically 2 (two) times daily. 40 g 0   fluticasone (FLONASE) 50 MCG/ACT nasal spray Place 2 sprays into both nostrils 2 (two) times daily. 15.8 mL 3   fluticasone-salmeterol (ADVAIR) 250-50 MCG/ACT AEPB INHALE 1 PUFF BY MOUTH TWICE DAILY 60 each 10   gabapentin (NEURONTIN) 300 MG capsule TAKE 1 CAPSULE BY MOUTH THREE TIMES DAILY 90 capsule 10   isosorbide mononitrate (IMDUR) 60 MG 24 hr tablet TAKE 1 TABLET (60MG ) BY MOUTH IN THE MORNING AND 1/2 TABLET (30MG ) IN THE EVENING 45 tablet 10   Melatonin 10 MG TABS Take 10 mg by mouth at bedtime.     nitroGLYCERIN (NITROSTAT) 0.4 MG SL tablet DISSOLVE 1 TABLET UNDER THE TONGUE AS NEEDED FOR CHEST PAIN EVERY 5 MINUTES UP TO 3 TIMES. IF NO RELIEF CALL 911. 25 tablet 10   omeprazole (PRILOSEC) 40 MG capsule Take 1 capsule (40 mg total) by mouth daily. 30 capsule 10   pravastatin (PRAVACHOL) 20 MG tablet TAKE 1 TABLET BY MOUTH DAILY. 30 tablet 10   tirzepatide ([name]) 5 MG/0.5ML Pen Inject 5 mg into the skin once a week. 12 mL 0   zonisamide (ZONEGRAN) 100 MG capsule Take 100 mg by mouth daily.     oxyCODONE-acetaminophen (PERCOCET/ROXICET) 5-325 MG tablet Take 1-2 tablets by mouth every 6 (six) hours as needed for severe pain (pain score 7-10). 15 tablet 0   No facility-administered medications prior to visit.    Allergies  Allergen Reactions   Divalproex Sodium Other (See Comments)    Causes anger   Statins Other (See Comments)    Muscle aches and cramps   Tramadol Other (See Comments)    Chest pain    Valproic Acid  Other (See Comments)    Causes anger   Gadolinium Derivatives Nausea And Vomiting    07/25/19 Pt vomited immediately after IV gad. Denies itching, dyspnea.  (Adverse, not allergic, reaction   Tricor [Fenofibrate] Other (See Comments)    Leg cramps    ROS Review of Systems  Constitutional:  Negative for fatigue and fever.  Eyes:  Negative for visual disturbance.  Respiratory:  Negative for chest tightness and shortness of breath.   Cardiovascular:  Negative for chest pain and palpitations.  Musculoskeletal:  Positive for neck pain.  Neurological:  Positive for dizziness. Negative for headaches.      Objective:    Physical Exam HENT:     Head: Normocephalic.     Right Ear: External ear normal.     Left Ear: External ear normal.     Nose: No congestion or rhinorrhea.     Mouth/Throat:     Mouth: Mucous membranes are moist.  Cardiovascular:     Rate and Rhythm: Regular rhythm.     Heart sounds: No murmur heard. Pulmonary:     Effort: No respiratory distress.     Breath sounds: Normal breath sounds.  Musculoskeletal:     Cervical back: Tenderness present. No rigidity.  Neurological:     Mental Status: He is alert.     BP 122/72   Pulse 66   Ht 5\' 9"  (1.753 m)   Wt 201 lb 1.9 oz (91.2 kg)   SpO2 99%   BMI 29.70 kg/m  Wt Readings from Last 3 Encounters:  10/20/23 201 lb 1.9 oz (91.2 kg)  10/17/23 203 lb (  92.1 kg)  09/27/23 197 lb (89.4 kg)    Lab Results  Component Value Date   TSH 1.077 01/27/2023   Lab Results  Component Value Date   WBC 2.4 (L) 10/17/2023   HGB 13.8 10/17/2023   HCT 39.6 10/17/2023   MCV 89.2 10/17/2023   PLT 67 (L) 10/17/2023   Lab Results  Component Value Date   NA 139 10/17/2023   K 4.1 10/17/2023   CO2 25 10/17/2023   GLUCOSE 120 (H) 10/17/2023   BUN 16 10/17/2023   CREATININE 1.00 10/17/2023   BILITOT 0.7 10/17/2023   ALKPHOS 42 10/17/2023   AST 28 10/17/2023   ALT 25 10/17/2023   PROT 6.5 10/17/2023   ALBUMIN 3.4 (L)  10/17/2023   CALCIUM 9.2 10/17/2023   ANIONGAP 7 10/17/2023   EGFR 101 03/08/2023   Lab Results  Component Value Date   CHOL 87 02/02/2023   Lab Results  Component Value Date   HDL 32 (L) 02/02/2023   Lab Results  Component Value Date   LDLCALC 36 02/02/2023   Lab Results  Component Value Date   TRIG 96 02/02/2023   Lab Results  Component Value Date   CHOLHDL 2.7 02/02/2023   Lab Results  Component Value Date   HGBA1C 5.8 (H) 08/17/2023      Assessment & Plan:  Fall, subsequent encounter Assessment & Plan: Labs and imaging studies have been reviewed, and no recent falls have occurred since leaving the ED. A refill of the patient's narcotic medication will be sent today. The patient is encouraged to continue taking Flexeril as prescribed, with heat application to the affected site. Additionally, over-the-counter Advil Targeted Relief pain-relieving cream was recommended for application to the affected area. The patient declines physical therapy at this time and prefers to follow up with his neurosurgeon first to hear their recommendations and plan of care. A note was provided for work, as the patient requested time off for the next few days. The patient was encouraged to stay well-hydrated, perform the Epley maneuver, change positions slowly, and avoid large meals, excessive caffeine, alcohol, and foods high in salt to manage dizziness.    Orders: -     oxyCODONE-Acetaminophen; Take 1-2 tablets by mouth every 6 (six) hours as needed for severe pain (pain score 7-10).  Dispense: 15 tablet; Refill: 0  Note: This chart has been completed using Engineer, civil (consulting) software, and while attempts have been made to ensure accuracy, certain words and phrases may not be transcribed as intended.    Follow-up: Return in about 1 week (around 10/27/2023).   Gilmore Laroche, FNP

## 2023-10-20 NOTE — Telephone Encounter (Signed)
Patient was a walk in today and  states he has not received the results of his sleep study he did in November.  Please call [phone]

## 2023-10-20 NOTE — Telephone Encounter (Signed)
Dr. Vassie Loll or his nurse will need to contact patient with sleep study results.  CPAP Titration has been scanned into pt's chart.

## 2023-10-20 NOTE — Patient Instructions (Addendum)
I appreciate the opportunity to provide care to you today!    Follow up:  1 week with pcp  Neck Pain:  Your Percocet prescription has been refilled and sent to your pharmacy. Please start taking it for neck pain as directed. I recommend applying over-the-counter Advil Targeted Relief pain-relieving cream to the affected area. Please follow up with your neurosurgeon as scheduled on November 23, 2023. Additionally, applying a warm compress or heating pad can help relax tight muscles, while cold therapy (such as an ice pack) can reduce inflammation and numb sharp pain. Maintaining good posture, especially when sitting or standing for long periods, can help alleviate strain on the neck muscles. Using ergonomic furniture and adjusting your computer screen height can also be beneficial.  Dizziness:  I recommend staying well-hydrated by drinking at least 64 ounces of water daily. Avoiding large meals, excessive caffeine, alcohol, and foods high in salt can help manage dizziness. Some people benefit from smaller, more frequent meals to prevent drops in blood sugar, which can cause dizziness. Ensuring adequate and restful sleep can help reduce dizziness, especially when it's related to conditions like sleep deprivation or anxiety. Slowly changing positions (such as when standing up from sitting or lying down) can help prevent dizziness caused by sudden changes in blood pressure (orthostatic hypotension).    Please continue to a heart-healthy diet and increase your physical activities. Try to exercise for at least five days a week.    It was a pleasure to see you and I look forward to continuing to work together on your health and well-being. Please do not hesitate to call the office if you need care or have questions about your care.  In case of emergency, please visit the Emergency Department for urgent care, or contact our clinic at [phone] to schedule an appointment. We're here to  help you!   Have a wonderful day and week. With Gratitude, Gilmore Laroche MSN, FNP-BC

## 2023-10-20 NOTE — Assessment & Plan Note (Signed)
Labs and imaging studies have been reviewed, and no recent falls have occurred since leaving the ED. A refill of the patient's narcotic medication will be sent today. The patient is encouraged to continue taking Flexeril as prescribed, with heat application to the affected site. Additionally, over-the-counter Advil Targeted Relief pain-relieving cream was recommended for application to the affected area. The patient declines physical therapy at this time and prefers to follow up with his neurosurgeon first to hear their recommendations and plan of care. A note was provided for work, as the patient requested time off for the next few days. The patient was encouraged to stay well-hydrated, perform the Epley maneuver, change positions slowly, and avoid large meals, excessive caffeine, alcohol, and foods high in salt to manage dizziness.

## 2023-10-21 NOTE — Telephone Encounter (Signed)
Called patient and informed results of titration study in which he will need a bipap. Informed order had been entered and once he receives he will need to call office to schedule 6 week f/u with RA.

## 2023-10-26 ENCOUNTER — Encounter: Payer: Self-pay | Admitting: Internal Medicine

## 2023-10-26 ENCOUNTER — Ambulatory Visit (INDEPENDENT_AMBULATORY_CARE_PROVIDER_SITE_OTHER): Payer: 59 | Admitting: Internal Medicine

## 2023-10-26 VITALS — BP 120/70 | HR 70 | Ht 69.0 in | Wt 200.0 lb

## 2023-10-26 DIAGNOSIS — Z9889 Other specified postprocedural states: Secondary | ICD-10-CM | POA: Diagnosis not present

## 2023-10-26 DIAGNOSIS — M542 Cervicalgia: Secondary | ICD-10-CM | POA: Diagnosis not present

## 2023-10-26 NOTE — Assessment & Plan Note (Signed)
He returns to care today for one week follow-up in the setting of recent ED presentation for fall with acute on chronic neck pain.  Imaging was negative for acute findings.  Seen at [facility] for follow-up on 12/11.  Today he reports that pain is improving.  He has returned to work.  ROM of the neck remains limited secondary to pain, extension and right lateral movements most prominently.  He has a history of cervical laminectomy and foraminotomies of the left C6-C7 vertebrae in June 2023.  He is scheduled to see his neurosurgeon, Dr. Jordan Likes, for follow-up on 11/23/2023. -No changes today.  Continue as needed use of Flexeril and Percocet.  He is completing home PT exercises as previously instructed. -Neurosurgery follow-up is scheduled for 1/14. -He will return to care for previously scheduled follow-up with me in April 2025

## 2023-10-26 NOTE — Progress Notes (Signed)
Established Patient Office Visit  Subjective   Patient ID: [name] [name], male    DOB: [birth year]  Age: 67 y.o. MRN: [id number]  Chief Complaint  Patient presents with   Follow-up   [name] [name] returns to care today for one week follow up.  He was last evaluated at [facility] on 12/11 for ER follow-up.  ED presentation on 12/8 in the setting of a fall.  He reportedly fell backwards while walking down the stairs, hitting the back of his head and neck.  He endorsed lower cervical pain.  Imaging, including CT head/C-spine, MRI brain, and x-rays of both shoulders and the lumbar spine were negative for acute findings.  He was provided with pain medication and discharged home.  He reported no additional falls at his appointment on 12/11.  Pain medication was refilled and one week follow-up with me was arranged. [name] [name] continues to endorse neck pain but states that symptoms are overall improving.  He has returned to work.  He does not have any additional concerns to discuss today.  Past Medical History:  Diagnosis Date   Anxiety    Aortic insufficiency    Arthritis    Asthma    Cataract    Right eye   Chronic lower back pain    Cirrhosis of liver (HCC)    Coronary atherosclerosis of native coronary artery    a. 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   LIMA to LAD, free RIMA to OM2, SVG to PDA, open SVG harvest from right thigh   Stroke (HCC)    Thrombocytopenia (HCC)    TIA (transient ischemic attack)    Type 2 diabetes mellitus (HCC)    Past Surgical History:  Procedure Laterality Date   ANTERIOR CERVICAL  DECOMP/DISCECTOMY FUSION  1998   "C3-4"   CARDIAC CATHETERIZATION  "several"   CARDIAC CATHETERIZATION N/A 12/30/2015   Procedure: Left Heart Cath and Coronary Angiography;  Surgeon: Runell Gess, MD;  [facility]: [facility];  Service: Cardiovascular;  Laterality: N/A;   CARPAL TUNNEL RELEASE Bilateral 2005   CHOLECYSTECTOMY N/A 11/28/2018   Procedure: LAPAROSCOPIC CHOLECYSTECTOMY;  Surgeon: Lucretia Roers, MD;  Location: [facility] OR[facility];  Service: General;  Laterality: N/A;   COLONOSCOPY     COLONOSCOPY WITH PROPOFOL N/A 09/27/2020   Procedure: COLONOSCOPY WITH PROPOFOL;  Surgeon: Dolores Frame, MD;  Location: [facility];  Service: Gastroenterology;  Laterality: N/A;   COLONOSCOPY WITH PROPOFOL N/A 06/29/2023   Procedure: COLONOSCOPY WITH PROPOFOL;  Surgeon: Dolores Frame, MD;  Location: [facility];  Service: Gastroenterology;  Laterality: N/A;  1:30PM;ASA 3   CORONARY ANGIOPLASTY     CORONARY ANGIOPLASTY WITH STENT PLACEMENT  2002; 2003; 11/20/2014   "I have 4 stents after today" (11/20/2014)   CORONARY ARTERY BYPASS GRAFT N/A 01/09/2016   Procedure: CORONARY ARTERY BYPASS GRAFTING (CABG) X 3 UTILIZING RIGHT AND LEFT INTERNAL MAMMARY ARTERY AND ENDOSCOPICALLY HARVESTED SAPHENEOUS VEIN.;  Surgeon: Purcell Nails, MD;  Location: [facility] OR;  Service: Open Heart Surgery;  Laterality: N/A;   ESOPHAGOGASTRODUODENOSCOPY     ESOPHAGOGASTRODUODENOSCOPY (EGD) WITH PROPOFOL N/A 09/27/2020   Procedure: ESOPHAGOGASTRODUODENOSCOPY (EGD) WITH PROPOFOL;  Surgeon: Dolores Frame, MD;  Location: [facility];  Service: Gastroenterology;  Laterality: N/A;  10   ESOPHAGOGASTRODUODENOSCOPY (EGD) WITH PROPOFOL N/A 06/29/2023   Procedure: ESOPHAGOGASTRODUODENOSCOPY (EGD) WITH PROPOFOL;  Surgeon: Dolores Frame, MD;  [facility]: [facility];  Service: Gastroenterology;  Laterality: N/A;  1:30PM;ASA 3   KNEE SURGERY Left 02/2012   "scraped; open"   LEFT HEART CATH AND  CORS/GRAFTS ANGIOGRAPHY N/A 08/09/2017   Procedure: LEFT HEART CATH AND CORS/GRAFTS ANGIOGRAPHY;  Surgeon: Yvonne Kendall, MD;  [facility];  Service: Cardiovascular;  Laterality: N/A;   LEFT HEART CATH AND CORS/GRAFTS ANGIOGRAPHY N/A 11/22/2018   Procedure: LEFT HEART CATH AND CORS/GRAFTS ANGIOGRAPHY;  Surgeon: Swaziland, [name], MD;  Location: [facility] INVASIVE CV LAB;  Service: Cardiovascular;  Laterality: N/A;   LEFT HEART CATH AND CORS/GRAFTS ANGIOGRAPHY N/A 08/20/2022   Procedure: LEFT HEART CATH AND CORS/GRAFTS ANGIOGRAPHY;  Surgeon: Kathleene Hazel, MD;  [facility]: [facility];  Service: Cardiovascular;  Laterality: N/A;   LEFT HEART CATHETERIZATION WITH CORONARY ANGIOGRAM N/A 07/20/2012   Procedure: LEFT HEART CATHETERIZATION WITH CORONARY ANGIOGRAM;  Surgeon: Iran Ouch, MD;  Location: [facility];  Service: Cardiovascular;  Laterality: N/A;   LEFT HEART CATHETERIZATION WITH CORONARY ANGIOGRAM N/A 11/20/2014   Procedure: LEFT HEART CATHETERIZATION WITH CORONARY ANGIOGRAM;  Surgeon: [name] [place]aziland, MD;  Location: [facility] CATH LAB;  Service: Cardiovascular;  Laterality: N/A;   LEFT HEART CATHETERIZATION WITH CORONARY ANGIOGRAM N/A 11/26/2014   Procedure: LEFT HEART CATHETERIZATION WITH CORONARY ANGIOGRAM;  Surgeon: [name] [place], MD;  Location: [facility] CATH LAB;  Service: Cardiovascular;  Laterality: N/A;   NEUROPLASTY / TRANSPOSITION ULNAR NERVE AT ELBOW Right ~ 2012   PERCUTANEOUS CORONARY ROTOBLATOR INTERVENTION (PCI-R)  11/20/2014   Procedure: PERCUTANEOUS CORONARY ROTOBLATOR INTERVENTION (PCI-R);  Surgeon: [name] [place]az[place], MD;  Location: [facility] CATH LAB;  Service: Cardiovascular;;   POSTERIOR CERVICAL LAMINECTOMY Left 04/16/2022   Procedure: Laminectomy and Foraminotomy - left - C6-C7;  Surgeon: Julio Sicks, MD;  Location: [facility] OR;  Service: Neurosurgery;  Laterality: Left;  3C   SHOULDER ARTHROSCOPY Left ~ 2011   TEE WITHOUT CARDIOVERSION N/A 01/09/2016   Procedure:  TRANSESOPHAGEAL ECHOCARDIOGRAM (TEE);  Surgeon: Purcell Nails, MD;  Location: [facility] OR;  Service: Open Heart Surgery;  Laterality: N/A;   Social History   Tobacco Use   Smoking status: Never    Passive exposure: Current   Smokeless tobacco: Never  Vaping Use   Vaping status: Never Used  Substance Use Topics   Alcohol use: No   Drug use: No   Family History  Problem Relation Age of Onset   Stroke Mother    Coronary artery disease Father 73   Asthma Sister    Multiple sclerosis Brother    Heart disease Paternal Uncle    Stroke Maternal Grandmother    Heart attack Paternal Grandmother    Cancer Paternal Grandfather    Asthma Sister    Seizures Son    Migraines Son    Autism Son    Migraines Son    Allergies  Allergen Reactions   Divalproex Sodium Other (See Comments)    Causes anger   Statins Other (See Comments)    Muscle aches and cramps   Tramadol Other (See Comments)    Chest pain  Valproic Acid Other (See Comments)    Causes anger   Gadolinium Derivatives Nausea And Vomiting    07/25/19 Pt vomited immediately after IV gad. Denies itching, dyspnea.  (Adverse, not allergic, reaction   Tricor [Fenofibrate] Other (See Comments)    Leg cramps   Review of Systems  Musculoskeletal:  Positive for neck pain (Improving).  All other systems reviewed and are negative.     Objective:     BP 120/70   Pulse 70   Ht 5\' 9"  (1.753 m)   Wt 200 lb (90.7 kg)   SpO2 97%   BMI 29.53 kg/m  BP Readings from Last 3 Encounters:  10/26/23 120/70  10/20/23 122/72  10/17/23 (!) 140/77   Physical Exam Vitals reviewed.  Constitutional:      General: He is not in acute distress.    Appearance: Normal appearance. He is not ill-appearing.  HENT:     Head: Normocephalic and atraumatic.     Right Ear: External ear normal.     Left Ear: External ear normal.     Nose: Nose normal. No congestion or rhinorrhea.     Mouth/Throat:     Mouth: Mucous membranes are moist.      Pharynx: Oropharynx is clear.  Eyes:     General: No scleral icterus.    Extraocular Movements: Extraocular movements intact.     Conjunctiva/sclera: Conjunctivae normal.     Pupils: Pupils are equal, round, and reactive to light.  Cardiovascular:     Rate and Rhythm: Normal rate and regular rhythm.     Pulses: Normal pulses.     Heart sounds: Murmur heard.  Pulmonary:     Effort: Pulmonary effort is normal.     Breath sounds: Normal breath sounds. No wheezing, rhonchi or rales.  Abdominal:     General: Abdomen is flat. Bowel sounds are normal. There is no distension.     Palpations: Abdomen is soft.     Tenderness: There is no abdominal tenderness.  Musculoskeletal:        General: No swelling or deformity.     Cervical back: Normal range of motion.     Comments: ROM of the neck is reduced in all directions, extension and right lateral movements most prominently, secondary to pain.  Skin:    General: Skin is warm and dry.     Capillary Refill: Capillary refill takes less than 2 seconds.  Neurological:     General: No focal deficit present.     Mental Status: He is alert and oriented to person, place, and time.     Motor: No weakness.  Psychiatric:        Mood and Affect: Mood normal.        Behavior: Behavior normal.        Thought Content: Thought content normal.   Last CBC Lab Results  Component Value Date   WBC 2.4 (L) 10/17/2023   HGB 13.8 10/17/2023   HCT 39.6 10/17/2023   MCV 89.2 10/17/2023   MCH 31.1 10/17/2023   RDW 13.8 10/17/2023   PLT 67 (L) 10/17/2023   Last metabolic panel Lab Results  Component Value Date   GLUCOSE 120 (H) 10/17/2023   NA 139 10/17/2023   K 4.1 10/17/2023   CL 107 10/17/2023   CO2 25 10/17/2023   BUN 16 10/17/2023   CREATININE 1.00 10/17/2023   GFRNONAA >60 10/17/2023   CALCIUM 9.2 10/17/2023   PROT 6.5 10/17/2023   ALBUMIN 3.4 (L) 10/17/2023  LABGLOB 3.5 09/29/2022   AGRATIO 1.1 (L) 09/29/2022   BILITOT 0.7 10/17/2023    ALKPHOS 42 10/17/2023   AST 28 10/17/2023   ALT 25 10/17/2023   ANIONGAP 7 10/17/2023   Last lipids Lab Results  Component Value Date   CHOL 87 02/02/2023   HDL 32 (L) 02/02/2023   LDLCALC 36 02/02/2023   TRIG 96 02/02/2023   CHOLHDL 2.7 02/02/2023   Last hemoglobin A1c Lab Results  Component Value Date   HGBA1C 5.8 (H) 08/17/2023   Last thyroid functions Lab Results  Component Value Date   TSH 1.077 01/27/2023   Last vitamin D Lab Results  Component Value Date   VD25OH 25.8 (L) 09/29/2022   Last vitamin B12 and Folate Lab Results  Component Value Date   VITAMINB12 199 08/04/2023   FOLATE 9.4 08/04/2023     Assessment & Plan:   Problem List Items Addressed This Visit       Neck pain with history of cervical spinal surgery - Primary   He returns to care today for one week follow-up in the setting of recent ED presentation for fall with acute on chronic neck pain.  Imaging was negative for acute findings.  Seen at [facility] for follow-up on 12/11.  Today he reports that pain is improving.  He has returned to work.  ROM of the neck remains limited secondary to pain, extension and right lateral movements most prominently.  He has a history of cervical laminectomy and foraminotomies of the left C6-C7 vertebrae in June 2023.  He is scheduled to see his neurosurgeon, Dr. Jordan Likes, for follow-up on 11/23/2023. -No changes today.  Continue as needed use of Flexeril and Percocet.  He is completing home PT exercises as previously instructed. -Neurosurgery follow-up is scheduled for 1/14. -He will return to care for previously scheduled follow-up with me in April 2025       Return if symptoms worsen or fail to improve.    [name], MD

## 2023-10-26 NOTE — Telephone Encounter (Signed)
Contacted patient and notified [employer] did receive Bipap order. Informed it could take several weeks before he get new machine. NFN

## 2023-10-26 NOTE — Patient Instructions (Signed)
It was a pleasure to see you today.  Thank you for giving [place] the opportunity to be involved in your care.  Below is a brief recap of your visit and next steps.  We will plan to see you again in April.  Summary No changes today Follow up as scheduled in April

## 2023-10-26 NOTE — Telephone Encounter (Signed)
Patient has not heard anything from the DME compnay regarding his BIPAP machine.  Patient call back [phone]

## 2023-11-07 DIAGNOSIS — B349 Viral infection, unspecified: Secondary | ICD-10-CM | POA: Diagnosis not present

## 2023-11-07 DIAGNOSIS — R509 Fever, unspecified: Secondary | ICD-10-CM | POA: Diagnosis not present

## 2023-11-07 DIAGNOSIS — Z20822 Contact with and (suspected) exposure to covid-19: Secondary | ICD-10-CM | POA: Diagnosis not present

## 2023-11-12 ENCOUNTER — Other Ambulatory Visit: Payer: Self-pay

## 2023-11-12 ENCOUNTER — Telehealth: Payer: Self-pay | Admitting: Internal Medicine

## 2023-11-12 ENCOUNTER — Encounter (INDEPENDENT_AMBULATORY_CARE_PROVIDER_SITE_OTHER): Payer: Self-pay | Admitting: *Deleted

## 2023-11-12 DIAGNOSIS — Z794 Long term (current) use of insulin: Secondary | ICD-10-CM

## 2023-11-12 MED ORDER — FREESTYLE LIBRE 2 READER DEVI: 1.0000 | 0 refills | Status: DC

## 2023-11-12 NOTE — Telephone Encounter (Signed)
 Copied from CRM [id number]. Topic: Clinical - Medication Refill >> Nov 12, 2023 11:16 AM Nestora PARAS wrote: Most Recent Primary Care Visit:  Provider: DIXON, PHILLIP E  Department: [facility]  Visit Type: OFFICE VISIT  Date: 10/26/2023  Medication: Gluc Receiver (FREESTYLE LIBRE 2 READER) DEVI  Has the patient contacted their pharmacy? No (Agent: If no, request that the patient contact the pharmacy for the refill. If patient does not wish to contact the pharmacy document the reason why and proceed with request.) (Agent: If yes, when and what did the pharmacy advise?) [facility] pt's behalf   Is this the correct pharmacy for this prescription? Yes If no, delete pharmacy and type the correct one.  This is the patient's preferred pharmacy:  [facility]- [address] [address] Phone: [phone] Fax: [phone]  Has the prescription been filled recently? No  Is the patient out of the medication? No  Has the patient been seen for an appointment in the last year OR does the patient have an upcoming appointment? Yes  Can we respond through MyChart? No  Agent: Please be advised that Rx refills may take up to 3 business days. We ask that you follow-up with your pharmacy.

## 2023-11-12 NOTE — Telephone Encounter (Signed)
 Refills sent to pharmacy.

## 2023-11-19 ENCOUNTER — Other Ambulatory Visit: Payer: Self-pay | Admitting: Family Medicine

## 2023-11-19 DIAGNOSIS — Z794 Long term (current) use of insulin: Secondary | ICD-10-CM

## 2023-11-26 ENCOUNTER — Other Ambulatory Visit: Payer: Self-pay | Admitting: Family Medicine

## 2023-11-26 DIAGNOSIS — E1100 Type 2 diabetes mellitus with hyperosmolarity without nonketotic hyperglycemic-hyperosmolar coma (NKHHC): Secondary | ICD-10-CM

## 2023-11-26 DIAGNOSIS — Z794 Long term (current) use of insulin: Secondary | ICD-10-CM

## 2023-11-30 ENCOUNTER — Ambulatory Visit (INDEPENDENT_AMBULATORY_CARE_PROVIDER_SITE_OTHER): Payer: 59

## 2023-11-30 VITALS — Ht 69.0 in | Wt 200.0 lb

## 2023-11-30 DIAGNOSIS — H9193 Unspecified hearing loss, bilateral: Secondary | ICD-10-CM

## 2023-11-30 DIAGNOSIS — Z0000 Encounter for general adult medical examination without abnormal findings: Secondary | ICD-10-CM | POA: Diagnosis not present

## 2023-11-30 DIAGNOSIS — E1100 Type 2 diabetes mellitus with hyperosmolarity without nonketotic hyperglycemic-hyperosmolar coma (NKHHC): Secondary | ICD-10-CM

## 2023-11-30 DIAGNOSIS — E1165 Type 2 diabetes mellitus with hyperglycemia: Secondary | ICD-10-CM

## 2023-11-30 NOTE — Progress Notes (Signed)
Because this visit was a virtual/telehealth visit,  certain criteria was not obtained, such a blood pressure, CBG if applicable, and timed get up and go. Any medications not marked as "taking" were not mentioned during the medication reconciliation part of the visit. Any vitals not documented were not able to be obtained due to this being a telehealth visit or patient was unable to self-report a recent blood pressure reading due to a lack of equipment at home via telehealth. Vitals that have been documented are verbally provided by the patient.  Interactive audio and video telecommunications were attempted between this provider and patient, however failed, due to patient having technical difficulties OR patient did not have access to video capability.  We continued and completed visit with audio only.  Subjective:   [name] [name] is a 68 y.o. male who presents for Medicare Annual/Subsequent preventive examination.  Visit Complete: Virtual I connected with  [name] [name] on 11/30/23 by a audio enabled telemedicine application and verified that I am speaking with the correct person using two identifiers.  Patient Location: Home  Provider Location: Office/Clinic  I discussed the limitations of evaluation and management by telemedicine. The patient expressed understanding and agreed to proceed.  Vital Signs: Because this visit was a virtual/telehealth visit, some criteria may be missing or patient reported. Any vitals not documented were not able to be obtained and vitals that have been documented are patient reported.  Patient Medicare AWV questionnaire was completed by the patient on 11/29/2023; I have confirmed that all information answered by patient is correct and no changes since this date.  Cardiac Risk Factors include: advanced age (>59men, >63 women);diabetes mellitus;dyslipidemia;male gender;hypertension     Objective:    Today's Vitals   11/30/23 1007  Weight: 200 lb (90.7 kg)   Height: 5\' 9"  (1.753 m)  PainSc: 0-No pain   Body mass index is 29.53 kg/m.     11/30/2023   10:14 AM 10/17/2023   12:58 PM 09/27/2023    8:56 PM 06/29/2023   11:31 AM 06/23/2023    8:10 AM 04/08/2023   12:03 PM 01/07/2023    9:06 AM  Advanced Directives  Does Patient Have a Medical Advance Directive? Yes No Yes Yes Yes No Yes  Type of Estate agent of [place];Living will  Healthcare Power of [place];Living will  Healthcare Power of [place];Living will  Healthcare Power of [place];Living will  Does patient want to make changes to medical advance directive? No - Patient declined  No - Patient declined   No - Patient declined No - Patient declined  Copy of Healthcare Power of Attorney in Chart? Yes - validated most recent copy scanned in chart (See row information)  No - copy requested  No - copy requested  No - copy requested  Would patient like information on creating a medical advance directive?      No - Patient declined No - Patient declined    Current Medications (verified) Outpatient Encounter Medications as of 11/30/2023  Medication Sig   albuterol (VENTOLIN HFA) 108 (90 Base) MCG/ACT inhaler Inhale 2 puffs into the lungs every 6 (six) hours as needed for wheezing or shortness of breath.   apixaban (ELIQUIS) 5 MG TABS tablet Take 1 tablet (5 mg total) by mouth 2 (two) times daily.   carvedilol (COREG) 6.25 MG tablet Take 1 tablet (6.25 mg total) by mouth 2 (two) times daily with a meal.   citalopram (CELEXA) 20 MG tablet TAKE 1 TABLET BY  MOUTH AT BEDTIME.   Continuous Glucose Receiver (FREESTYLE LIBRE 2 READER) DEVI 1 Device by Does not apply route continuous.   Continuous Glucose Sensor (FREESTYLE LIBRE 2 SENSOR) MISC USE 1 EACH FOR 14 DAYS   cyclobenzaprine (FLEXERIL) 10 MG tablet TAKE 1 TABLET BY MOUTH THREE TIMES DAILY AS NEEDED FOR MUSCLE SPASMS   ezetimibe (ZETIA) 10 MG tablet TAKE 1 TABLET BY MOUTH ONCE DAILY   FARXIGA 5 MG TABS tablet TAKE 1 TABLET  BY MOUTH DAILY BEFORE BREAKFAST   fluticasone (FLONASE) 50 MCG/ACT nasal spray Place 2 sprays into both nostrils 2 (two) times daily.   fluticasone-salmeterol (ADVAIR) 250-50 MCG/ACT AEPB INHALE 1 PUFF BY MOUTH TWICE DAILY   gabapentin (NEURONTIN) 300 MG capsule TAKE 1 CAPSULE BY MOUTH THREE TIMES DAILY   isosorbide mononitrate (IMDUR) 60 MG 24 hr tablet TAKE 1 TABLET (60MG ) BY MOUTH IN THE MORNING AND 1/2 TABLET (30MG ) IN THE EVENING   Melatonin 10 MG TABS Take 10 mg by mouth at bedtime.   nitroGLYCERIN (NITROSTAT) 0.4 MG SL tablet DISSOLVE 1 TABLET UNDER THE TONGUE AS NEEDED FOR CHEST PAIN EVERY 5 MINUTES UP TO 3 TIMES. IF NO RELIEF CALL 911.   omeprazole (PRILOSEC) 40 MG capsule Take 1 capsule (40 mg total) by mouth daily.   pravastatin (PRAVACHOL) 20 MG tablet TAKE 1 TABLET BY MOUTH DAILY.   tirzepatide [facility]) 5 MG/0.5ML Pen Inject 5 mg into the skin once a week.   topiramate (TOPAMAX) 50 MG tablet SMARTSIG:1.0 Tablet(s) By Mouth Twice Daily   zonisamide (ZONEGRAN) 100 MG capsule Take 100 mg by mouth daily.   fluorouracil (EFUDEX) 5 % cream Apply topically 2 (two) times daily. (Patient not taking: Reported on 11/30/2023)   oxyCODONE-acetaminophen (PERCOCET/ROXICET) 5-325 MG tablet Take 1-2 tablets by mouth every 6 (six) hours as needed for severe pain (pain score 7-10). (Patient not taking: Reported on 11/30/2023)   No facility-administered encounter medications on file as of 11/30/2023.    Allergies (verified) Divalproex sodium, Statins, Tramadol, Valproic acid, Gadolinium derivatives, and Tricor [fenofibrate]   History: Past Medical History:  Diagnosis Date   Anxiety    Aortic insufficiency    Arthritis    Asthma    Cataract    Right eye   Chronic lower back pain    Cirrhosis of liver (HCC)    Coronary atherosclerosis of native coronary artery    a. 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   LIMA to LAD, free RIMA to OM2, SVG to PDA, open SVG harvest from right thigh   Stroke (HCC)    Thrombocytopenia (HCC)    TIA (transient ischemic attack)    Type 2 diabetes mellitus (HCC)    Past Surgical History:  Procedure Laterality Date   ANTERIOR CERVICAL DECOMP/DISCECTOMY FUSION  1998   "C3-4"   CARDIAC CATHETERIZATION  "several"   CARDIAC CATHETERIZATION N/A 12/30/2015   Procedure: Left Heart Cath and Coronary Angiography;  Surgeon: Runell Gess, MD;  [facility]: [facility];  Service:  Cardiovascular;  Laterality: N/A;   CARPAL TUNNEL RELEASE Bilateral 2005   CHOLECYSTECTOMY N/A 11/28/2018   Procedure: LAPAROSCOPIC CHOLECYSTECTOMY;  Surgeon: Lucretia Roers, MD;  Location: [facility] ORS;  Service: General;  Laterality: N/A;   COLONOSCOPY     COLONOSCOPY WITH PROPOFOL N/A 09/27/2020   Procedure: COLONOSCOPY WITH PROPOFOL;  Surgeon: Dolores Frame, MD;  Location: [facility];  Service: Gastroenterology;  Laterality: N/A;   COLONOSCOPY WITH PROPOFOL N/A 06/29/2023   Procedure: COLONOSCOPY WITH PROPOFOL;  Surgeon: Dolores Frame, MD;  [facility]: [facility];  Service: Gastroenterology;  Laterality: N/A;  1:30PM;ASA 3   CORONARY ANGIOPLASTY     CORONARY ANGIOPLASTY WITH STENT PLACEMENT  2002; 2003; 11/20/2014   "I have 4 stents after today" (11/20/2014)   CORONARY ARTERY BYPASS GRAFT N/A 01/09/2016   Procedure: CORONARY ARTERY BYPASS GRAFTING (CABG) X 3 UTILIZING RIGHT AND LEFT INTERNAL MAMMARY ARTERY AND ENDOSCOPICALLY HARVESTED SAPHENEOUS VEIN.;  Surgeon: Purcell Nails, MD;  Location: [facility] OR;  Service: Open Heart Surgery;  Laterality: N/A;   ESOPHAGOGASTRODUODENOSCOPY      ESOPHAGOGASTRODUODENOSCOPY (EGD) WITH PROPOFOL N/A 09/27/2020   Procedure: ESOPHAGOGASTRODUODENOSCOPY (EGD) WITH PROPOFOL;  Surgeon: Dolores Frame, MD;  [facility];  Service: Gastroenterology;  Laterality: N/A;  10   ESOPHAGOGASTRODUODENOSCOPY (EGD) WITH PROPOFOL N/A 06/29/2023   Procedure: ESOPHAGOGASTRODUODENOSCOPY (EGD) WITH PROPOFOL;  Surgeon: Dolores Frame, MD;  [facility];  Service: Gastroenterology;  Laterality: N/A;  1:30PM;ASA 3   KNEE SURGERY Left 02/2012   "scraped; open"   LEFT HEART CATH AND CORS/GRAFTS ANGIOGRAPHY N/A 08/09/2017   Procedure: LEFT HEART CATH AND CORS/GRAFTS ANGIOGRAPHY;  Surgeon: Yvonne Kendall, MD;  [facility];  Service: Cardiovascular;  Laterality: N/A;   LEFT HEART CATH AND CORS/GRAFTS ANGIOGRAPHY N/A 11/22/2018   Procedure: LEFT HEART CATH AND CORS/GRAFTS ANGIOGRAPHY;  Surgeon: Swaziland, [name], MD;  Location: [facility] INVASIVE CV LAB;  Service: Cardiovascular;  Laterality: N/A;   LEFT HEART CATH AND CORS/GRAFTS ANGIOGRAPHY N/A 08/20/2022   Procedure: LEFT HEART CATH AND CORS/GRAFTS ANGIOGRAPHY;  Surgeon: Kathleene Hazel, MD;  [facility];  Service: Cardiovascular;  Laterality: N/A;   LEFT HEART CATHETERIZATION WITH CORONARY ANGIOGRAM N/A 07/20/2012   Procedure: LEFT HEART CATHETERIZATION WITH CORONARY ANGIOGRAM;  Surgeon: Iran Ouch, MD;  [facility]: [facility] C[facility];  Service: Cardiovascular;  Laterality: N/A;   LEFT HEART CATHETERIZATION WITH CORONARY ANGIOGRAM N/A 11/20/2014   Procedure: LEFT HEART CATHETERIZATION WITH CORONARY ANGIOGRAM;  Surgeon: [name] Swaziland, MD;  Location: [facility] CATH LAB;  Service: Cardiovascular;  Laterality: N/A;   LEFT HEART CATHETERIZATION WITH CORONARY ANGIOGRAM N/A 11/26/2014   Procedure: LEFT HEART CATHETERIZATION WITH CORONARY ANGIOGRAM;  Surgeon: [name] Swaziland, MD;  Location: [facility] CATH LAB;  Service: Cardiovascular;  Laterality: N/A;   NEUROPLASTY /  TRANSPOSITION ULNAR NERVE AT ELBOW Right ~ 2012   PERCUTANEOUS CORONARY ROTOBLATOR INTERVENTION (PCI-R)  11/20/2014   Procedure: PERCUTANEOUS CORONARY ROTOBLATOR INTERVENTION (PCI-R);  Surgeon: [name] Swaziland, MD;  Location: [facility] CATH LAB;  Service: Cardiovascular;;   POSTERIOR CERVICAL LAMINECTOMY Left 04/16/2022   Procedure: Laminectomy and Foraminotomy - left - C6-C7;  Surgeon: Julio Sicks, MD;  [facility] OR;  Service: Neurosurgery;  Laterality: Left;  3C   SHOULDER ARTHROSCOPY Left ~ 2011   TEE WITHOUT CARDIOVERSION N/A 01/09/2016   Procedure: TRANSESOPHAGEAL ECHOCARDIOGRAM (TEE);  Surgeon: Purcell Nails, MD;  Location: [facility] OR;  Service: Open Heart Surgery;  Laterality: N/A;  Family History  Problem Relation Age of Onset   Stroke Mother    Coronary artery disease Father 19   Asthma Sister    Multiple sclerosis Brother    Heart disease Paternal Uncle    Stroke Maternal Grandmother    Heart attack Paternal Grandmother    Cancer Paternal Grandfather    Asthma Sister    Seizures Son    Migraines Son    Autism Son    Migraines Son    Social History   Socioeconomic History   Marital status: Married    Spouse name: [name]   Number of children: 3   Years of education: 12 th    Highest education level: Not on file  Occupational History   Occupation: Unemployed  Tobacco Use   Smoking status: Never    Passive exposure: Current   Smokeless tobacco: Never  Vaping Use   Vaping status: Never Used  Substance and Sexual Activity   Alcohol use: No   Drug use: No   Sexual activity: Not on file  Other Topics Concern   Not on file  Social History Narrative   Patient lives at home with wife [name].   Patient works at [employer], Social research officer, government.   Patient has a 12 th grade education.    Patient has 3 children.       Updated 11/30/2023:   Works at [employer] as a Engineer, materials      Social Drivers of Corporate investment banker Strain: Low Risk  (11/30/2023)   Overall  Financial Resource Strain (CARDIA)    Difficulty of Paying Living Expenses: Not hard at all  Food Insecurity: No Food Insecurity (11/30/2023)   Hunger Vital Sign    Worried About Running Out of Food in the Last Year: Never true    Ran Out of Food in the Last Year: Never true  Transportation Needs: No Transportation Needs (11/30/2023)   PRAPARE - Administrator, Civil Service (Medical): No    Lack of Transportation (Non-Medical): No  Physical Activity: Sufficiently Active (11/30/2023)   Exercise Vital Sign    Days of Exercise per Week: 3 days    Minutes of Exercise per Session: 60 min  Stress: No Stress Concern Present (11/30/2023)   [employer] of Occupational Health - Occupational Stress Questionnaire    Feeling of Stress : Not at all  Social Connections: Socially Integrated (11/30/2023)   Social Connection and Isolation Panel [NHANES]    Frequency of Communication with Friends and Family: More than three times a week    Frequency of Social Gatherings with Friends and Family: More than three times a week    Attends Religious Services: More than 4 times per year    Active Member of [employer] or Organizations: Yes    Attends Engineer, structural: More than 4 times per year    Marital Status: Married    Tobacco Counseling Counseling given: Yes   Clinical Intake:  Pre-visit preparation completed: Yes  Pain : No/denies pain Pain Score: 0-No pain     BMI - recorded: 29.53 Nutritional Status: BMI 25 -29 Overweight Nutritional Risks: None Diabetes: Yes CBG done?: No Did pt. bring in CBG monitor from home?: No (telehealth viist. unable to obtain bg. patient wears a CBG)  How often do you need to have someone help you when you read instructions, pamphlets, or other written materials from your doctor or pharmacy?: 1 - Never  Interpreter Needed?: No  Information  entered by :: Maryjean Ka CMA   Activities of Daily Living    11/30/2023   10:09 AM 06/23/2023    11:23 AM  In your present state of health, do you have any difficulty performing the following activities:  Hearing? 0 0  Comment pt has had a hearing test. never fitted for hearing aids. Referral placed today patient in agreement with treatment plan   Vision? 0 0  Comment sees [facility]   Difficulty concentrating or making decisions? 0 0  Walking or climbing stairs? 0   Dressing or bathing? 0 0  Doing errands, shopping? 0   Preparing Food and eating ? N   Using the Toilet? N   In the past six months, have you accidently leaked urine? N   Do you have problems with loss of bowel control? N   Managing your Medications? N   Managing your Finances? N   Housekeeping or managing your Housekeeping? N     Patient Care Team: [name], MD as PCP - General (Internal Medicine) Jonelle Sidle, MD as PCP - Cardiology (Cardiology) Doreatha Massed, MD as Medical Oncologist (Hematology)  Indicate any recent Medical Services you may have received from other than [facility] providers in the past year (date may be approximate).     Assessment:   This is a routine wellness examination for [facility].  Hearing/Vision screen Hearing Screening - Comments:: Has difficulty hearing. Never fitted for hearing aids. Referral placed today. Patient in agreement with treatment plan  Vision Screening - Comments:: Wears reading glasses only. UTD on yearly exams. Sees [facility] in [place]   Goals Addressed             This Visit's Progress    Patient Stated       Improve my health, eat better, and increase my physical activity        Depression Screen    11/30/2023   10:20 AM 10/20/2023    8:50 AM 08/17/2023    1:39 PM 08/06/2023    1:20 PM 04/07/2023    4:13 PM 03/08/2023    8:15 AM 02/09/2023    9:10 AM  PHQ 2/9 Scores  PHQ - 2 Score 0 0 0 0 0 0 0  PHQ- 9 Score 1 0   0  1    Fall Risk    11/30/2023   10:16 AM 10/20/2023    8:56 AM 10/20/2023    8:50 AM  08/17/2023    1:39 PM 08/06/2023    1:20 PM  Fall Risk   Falls in the past year? 1 1 0 0 0  Number falls in past yr: 0 1 0 0 0  Injury with Fall? 1 1 0 0 0  Risk for fall due to : History of fall(s);Impaired balance/gait No Fall Risks No Fall Risks    Follow up Education provided;Falls prevention discussed Falls evaluation completed Falls evaluation completed      MEDICARE RISK AT HOME: Medicare Risk at Home Any stairs in or around the home?: Yes If so, are there any without handrails?: No Home free of loose throw rugs in walkways, pet beds, electrical cords, etc?: Yes Adequate lighting in your home to reduce risk of falls?: Yes Life alert?: Yes Use of a cane, walker or w/c?: No (pt states he has a cane but doesn't use it. feels like he is stable enough without it) Grab bars in the bathroom?: Yes Shower chair or bench in shower?:  Yes Elevated toilet seat or a handicapped toilet?: No  TIMED UP AND GO:  Was the test performed?  No    Cognitive Function:    11/23/2022   10:50 AM  MMSE - Mini Mental State Exam  Not completed: Unable to complete        11/30/2023   10:18 AM 11/23/2022   10:54 AM  6CIT Screen  What Year? 0 points 0 points  What month? 0 points 0 points  What time? 0 points 0 points  Count back from 20 0 points 0 points  Months in reverse 0 points 0 points  Repeat phrase 0 points 0 points  Total Score 0 points 0 points    Immunizations Immunization History  Administered Date(s) Administered   Influenza Split 09/16/2011, 07/21/2012   Influenza,inj,Quad PF,6+ Mos 12/05/2013, 08/11/2017, 11/21/2018, 09/06/2019, 07/28/2021, 09/29/2022   Moderna Covid-19 Fall Seasonal Vaccine 72yrs & older 05/19/2022   Pneumococcal Conjugate PCV 7 04/21/2022, 04/22/2022   Tdap 10/21/2021   Zoster Recombinant(Shingrix) 04/22/2022, 09/22/2022    TDAP status: Up to date  Flu Vaccine status: Up to date  Pneumococcal vaccine status: Due, Education has been provided regarding  the importance of this vaccine. Advised may receive this vaccine at local pharmacy or Health Dept. Aware to provide a copy of the vaccination record if obtained from local pharmacy or Health Dept. Verbalized acceptance and understanding.  Covid-19 vaccine status: Information provided on how to obtain vaccines.   Qualifies for Shingles Vaccine? No   Zostavax completed No   Shingrix Completed?: Yes  Screening Tests Health Maintenance  Topic Date Due   Pneumonia Vaccine 94+ Years old (1 of 2 - PCV) 07/05/1962   FOOT EXAM  Never done   COVID-19 Vaccine (2 - Moderna risk series) 06/16/2022   Diabetic kidney evaluation - Urine ACR  11/14/2023   Medicare Annual Wellness (AWV)  11/24/2023   INFLUENZA VACCINE  02/07/2024 (Originally 06/10/2023)   HEMOGLOBIN A1C  02/15/2024   OPHTHALMOLOGY EXAM  07/21/2024   Diabetic kidney evaluation - eGFR measurement  10/16/2024   DTaP/Tdap/Td (2 - Td or Tdap) 10/22/2031   Colonoscopy  06/28/2033   Hepatitis C Screening  Completed   HIV Screening  Completed   Zoster Vaccines- Shingrix  Completed   HPV VACCINES  Aged Out    Health Maintenance  Health Maintenance Due  Topic Date Due   Pneumonia Vaccine 70+ Years old (1 of 2 - PCV) 07/05/1962   FOOT EXAM  Never done   COVID-19 Vaccine (2 - Moderna risk series) 06/16/2022   Diabetic kidney evaluation - Urine ACR  11/14/2023   Medicare Annual Wellness (AWV)  11/24/2023    Colorectal cancer screening: Type of screening: Colonoscopy. Completed 06/29/2023. Repeat every 10 years  Lung Cancer Screening: (Low Dose CT Chest recommended if Age 89-80 years, 20 pack-year currently smoking OR have quit w/in 15years.) does not qualify.   Lung Cancer Screening Referral: na  Additional Screening:  Hepatitis C Screening: does not qualify; Completed   Vision Screening: Recommended annual ophthalmology exams for early detection of glaucoma and other disorders of the eye. Is the patient up to date with their annual  eye exam?  Yes  Who is the provider or what is the name of the office in which the patient attends annual eye exams? [facility] If pt is not established with a provider, would they like to be referred to a provider to establish care? No .   Dental Screening: Recommended annual dental  exams for proper oral hygiene  Diabetic Foot Exam: Diabetic Foot Exam: Overdue, Pt has been advised about the importance in completing this exam. Pt is scheduled for diabetic foot exam on provider notified of the need for diabetic foot exam.  Community Resource Referral / Chronic Care Management: CRR required this visit?  No   CCM required this visit?  No     Plan:     I have personally reviewed and noted the following in the patient's chart:   Medical and social history Use of alcohol, tobacco or illicit drugs  Current medications and supplements including opioid prescriptions. Patient is currently taking opioid prescriptions. Information provided to patient regarding non-opioid alternatives. Patient advised to discuss non-opioid treatment plan with their provider. Functional ability and status Nutritional status Physical activity Advanced directives List of other physicians Hospitalizations, surgeries, and ER visits in previous 12 months Vitals Screenings to include cognitive, depression, and falls Referrals and appointments  In addition, I have reviewed and discussed with patient certain preventive protocols, quality metrics, and best practice recommendations. A written personalized care plan for preventive services as well as general preventive health recommendations were provided to patient.     Jordan Hawks Marcus Schwandt, CMA   11/30/2023   After Visit Summary: (MyChart) Due to this being a telephonic visit, the after visit summary with patients personalized plan was offered to patient via MyChart   Nurse Notes: see routing comment

## 2023-11-30 NOTE — Patient Instructions (Addendum)
[name] [name] , Thank you for taking time to come for your Medicare Wellness Visit. I appreciate your ongoing commitment to your health goals. Please review the following plan we discussed and let me know if I can assist you in the future.   Referrals/Orders/Follow-Ups/Clinician Recommendations:   Next Medicare Annual Wellness Visit:  December 04, 2024 @ 2:30 virtual visit  You have been referred to an audiologist to have a hearing test.If you haven't heard from them in the next week, please call their office to schedule your appointment.   [name] [name] Address: [address], [place], [place] [address] Phone: [phone]  Please have labs completed at Dr. Dion Saucier office. You do not need an appointment. No need to fast for these labs.   This is a list of the screening recommended for you and due dates:  Health Maintenance  Topic Date Due   Pneumonia Vaccine (1 of 2 - PCV) 07/05/1962   Complete foot exam   Never done   COVID-19 Vaccine (2 - Moderna risk series) 06/16/2022   Yearly kidney health urinalysis for diabetes  11/14/2023   Hemoglobin A1C  02/15/2024   Eye exam for diabetics  07/21/2024   Yearly kidney function blood test for diabetes  10/16/2024   Medicare Annual Wellness Visit  11/29/2024   DTaP/Tdap/Td vaccine (2 - Td or Tdap) 10/22/2031   Colon Cancer Screening  06/28/2033   Flu Shot  Completed   Hepatitis C Screening  Completed   HIV Screening  Completed   Zoster (Shingles) Vaccine  Completed   HPV Vaccine  Aged Out    Advanced directives: (In Chart) A copy of your advanced directives are scanned into your chart should your provider ever need it.  Next Medicare Annual Wellness Visit scheduled for next year: yes  Preventive Care 9 Years and Older, Male Preventive care refers to lifestyle choices and visits with your health care provider that can promote health and wellness. Preventive care visits are also called wellness exams. What can I expect for my preventive care  visit? Counseling During your preventive care visit, your health care provider may ask about your: Medical history, including: Past medical problems. Family medical history. History of falls. Current health, including: Emotional well-being. Home life and relationship well-being. Sexual activity. Memory and ability to understand (cognition). Lifestyle, including: Alcohol, nicotine or tobacco, and drug use. Access to firearms. Diet, exercise, and sleep habits. Work and work Astronomer. Sunscreen use. Safety issues such as seatbelt and bike helmet use. Physical exam Your health care provider will check your: Height and weight. These may be used to calculate your BMI (body mass index). BMI is a measurement that tells if you are at a healthy weight. Waist circumference. This measures the distance around your waistline. This measurement also tells if you are at a healthy weight and may help predict your risk of certain diseases, such as type 2 diabetes and high blood pressure. Heart rate and blood pressure. Body temperature. Skin for abnormal spots. What immunizations do I need?  Vaccines are usually given at various ages, according to a schedule. Your health care provider will recommend vaccines for you based on your age, medical history, and lifestyle or other factors, such as travel or where you work. What tests do I need? Screening Your health care provider may recommend screening tests for certain conditions. This may include: Lipid and cholesterol levels. Diabetes screening. This is done by checking your blood sugar (glucose) after you have not eaten for  a while (fasting). Hepatitis C test. Hepatitis B test. HIV (human immunodeficiency virus) test. STI (sexually transmitted infection) testing, if you are at risk. Lung cancer screening. Colorectal cancer screening. Prostate cancer screening. Abdominal aortic aneurysm (AAA) screening. You may need this if you are a current or  former smoker. Talk with your health care provider about your test results, treatment options, and if necessary, the need for more tests. Follow these instructions at home: Eating and drinking  Eat a diet that includes fresh fruits and vegetables, whole grains, lean protein, and low-fat dairy products. Limit your intake of foods with high amounts of sugar, saturated fats, and salt. Take vitamin and mineral supplements as recommended by your health care provider. Do not drink alcohol if your health care provider tells you not to drink. If you drink alcohol: Limit how much you have to 0-2 drinks a day. Know how much alcohol is in your drink. In the [place] one drink equals one 12 oz bottle of beer (355 mL), one 5 oz glass of wine (148 mL), or one 1 oz glass of hard liquor (44 mL). Lifestyle Brush your teeth every morning and night with fluoride toothpaste. Floss one time each day. Exercise for at least 30 minutes 5 or more days each week. Do not use any products that contain nicotine or tobacco. These products include cigarettes, chewing tobacco, and vaping devices, such as e-cigarettes. If you need help quitting, ask your health care provider. Do not use drugs. If you are sexually active, practice safe sex. Use a condom or other form of protection to prevent STIs. Take aspirin only as told by your health care provider. Make sure that you understand how much to take and what form to take. Work with your health care provider to find out whether it is safe and beneficial for you to take aspirin daily. Ask your health care provider if you need to take a cholesterol-lowering medicine (statin). Find healthy ways to manage stress, such as: Meditation, yoga, or listening to music. Journaling. Talking to a trusted person. Spending time with friends and family. Safety Always wear your seat belt while driving or riding in a vehicle. Do not drive: If you have been drinking alcohol. Do not ride with  someone who has been drinking. When you are tired or distracted. While texting. If you have been using any mind-altering substances or drugs. Wear a helmet and other protective equipment during sports activities. If you have firearms in your house, make sure you follow all gun safety procedures. Minimize exposure to UV radiation to reduce your risk of skin cancer. What's next? Visit your health care provider once a year for an annual wellness visit. Ask your health care provider how often you should have your eyes and teeth checked. Stay up to date on all vaccines. This information is not intended to replace advice given to you by your health care provider. Make sure you discuss any questions you have with your health care provider. Document Revised: 04/23/2021 Document Reviewed: 04/23/2021 Elsevier Patient Education  2024 [employer]. Understanding Your Risk for Falls Millions of people have serious injuries from falls each year. It is important to understand your risk of falling. Talk with your health care provider about your risk and what you can do to lower it. If you do have a serious fall, make sure to tell your provider. Falling once raises your risk of falling again. How can falls affect me? Serious injuries from falls are common. These include:  Broken bones, such as hip fractures. Head injuries, such as traumatic brain injuries (TBI) or concussions. A fear of falling can cause you to avoid activities and stay at home. This can make your muscles weaker and raise your risk for a fall. What can increase my risk? There are a number of risk factors that increase your risk for falling. The more risk factors you have, the higher your risk of falling. Serious injuries from a fall happen most often to people who are older than 66 years old. Teenagers and young adults ages 25-29 are also at higher risk. Common risk factors include: Weakness in the lower body. Being generally weak or  confused due to long-term (chronic) illness. Dizziness or balance problems. Poor vision. Medicines that cause dizziness or drowsiness. These may include: Medicines for your blood pressure, heart, anxiety, insomnia, or swelling (edema). Pain medicines. Muscle relaxants. Other risk factors include: Drinking alcohol. Having had a fall in the past. Having foot pain or wearing improper footwear. Working at a dangerous job. Having any of the following in your home: Tripping hazards, such as floor clutter or loose rugs. Poor lighting. Pets. Having dementia or memory loss. What actions can I take to lower my risk of falling?     Physical activity Stay physically fit. Do strength and balance exercises. Consider taking a regular class to build strength and balance. Yoga and tai chi are good options. Vision Have your eyes checked every year and your prescription for glasses or contacts updated as needed. Shoes and walking aids Wear non-skid shoes. Wear shoes that have rubber soles and low heels. Do not wear high heels. Do not walk around the house in socks or slippers. Use a cane or walker as told by your provider. Home safety Attach secure railings on both sides of your stairs. Install grab bars for your bathtub, shower, and toilet. Use a non-skid mat in your bathtub or shower. Attach bath mats securely with double-sided, non-slip rug tape. Use good lighting in all rooms. Keep a flashlight near your bed. Make sure there is a clear path from your bed to the bathroom. Use night-lights. Do not use throw rugs. Make sure all carpeting is taped or tacked down securely. Remove all clutter from walkways and stairways, including extension cords. Repair uneven or broken steps and floors. Avoid walking on icy or slippery surfaces. Walk on the grass instead of on icy or slick sidewalks. Use ice melter to get rid of ice on walkways in the winter. Use a cordless phone. Questions to ask your health  care provider Can you help me check my risk for a fall? Do any of my medicines make me more likely to fall? Should I take a vitamin D supplement? What exercises can I do to improve my strength and balance? Should I make an appointment to have my vision checked? Do I need a bone density test to check for weak bones (osteoporosis)? Would it help to use a cane or a walker? Where to find more information Centers for Disease Control and Prevention, STEADI: TonerPromos.no Community-Based Fall Prevention Programs: TonerPromos.no [employer] on Aging: BaseRingTones.pl Contact a health care provider if: You fall at home. You are afraid of falling at home. You feel weak, drowsy, or dizzy. This information is not intended to replace advice given to you by your health care provider. Make sure you discuss any questions you have with your health care provider. Document Revised: 06/29/2022 Document Reviewed: 06/29/2022 Elsevier Patient Education  2024 Elsevier  Inc.   Managing Pain Without Opioids Opioids are strong medicines used to treat moderate to severe pain. For some people, especially those who have long-term (chronic) pain, opioids may not be the best choice for pain management due to: Side effects like nausea, constipation, and sleepiness. The risk of addiction (opioid use disorder). The longer you take opioids, the greater your risk of addiction. Pain that lasts for more than 3 months is called chronic pain. Managing chronic pain usually requires more than one approach and is often provided by a team of health care providers working together (multidisciplinary approach). Pain management may be done at a pain management center or pain clinic. How to manage pain without the use of opioids Use non-opioid medicines Non-opioid medicines for pain may include: Over-the-counter or prescription non-steroidal anti-inflammatory drugs (NSAIDs). These may be the first medicines used for pain. They work well for  muscle and bone pain, and they reduce swelling. Acetaminophen. This over-the-counter medicine may work well for milder pain but not swelling. Antidepressants. These may be used to treat chronic pain. A certain type of antidepressant (tricyclics) is often used. These medicines are given in lower doses for pain than when used for depression. Anticonvulsants. These are usually used to treat seizures but may also reduce nerve (neuropathic) pain. Muscle relaxants. These relieve pain caused by sudden muscle tightening (spasms). You may also use a pain medicine that is applied to the skin as a patch, cream, or gel (topical analgesic), such as a numbing medicine. These may cause fewer side effects than medicines taken by mouth. Do certain therapies as directed Some therapies can help with pain management. They include: Physical therapy. You will do exercises to gain strength and flexibility. A physical therapist may teach you exercises to move and stretch parts of your body that are weak, stiff, or painful. You can learn these exercises at physical therapy visits and practice them at home. Physical therapy may also involve: Massage. Heat wraps or applying heat or cold to affected areas. Electrical signals that interrupt pain signals (transcutaneous electrical nerve stimulation, TENS). Weak lasers that reduce pain and swelling (low-level laser therapy). Signals from your body that help you learn to regulate pain (biofeedback). Occupational therapy. This helps you to learn ways to function at home and work with less pain. Recreational therapy. This involves trying new activities or hobbies, such as a physical activity or drawing. Mental health therapy, including: Cognitive behavioral therapy (CBT). This helps you learn coping skills for dealing with pain. Acceptance and commitment therapy (ACT) to change the way you think and react to pain. Relaxation therapies, including muscle relaxation exercises and  mindfulness-based stress reduction. Pain management counseling. This may be individual, family, or group counseling.  Receive medical treatments Medical treatments for pain management include: Nerve block injections. These may include a pain blocker and anti-inflammatory medicines. You may have injections: Near the spine to relieve chronic back or neck pain. Into joints to relieve back or joint pain. Into nerve areas that supply a painful area to relieve body pain. Into muscles (trigger point injections) to relieve some painful muscle conditions. A medical device placed near your spine to help block pain signals and relieve nerve pain or chronic back pain (spinal cord stimulation device). Acupuncture. Follow these instructions at home Medicines Take over-the-counter and prescription medicines only as told by your health care provider. If you are taking pain medicine, ask your health care providers about possible side effects to watch out for. Do not drive or  use heavy machinery while taking prescription opioid pain medicine. Lifestyle  Do not use drugs or alcohol to reduce pain. If you drink alcohol, limit how much you have to: 0-1 drink a day for women who are not pregnant. 0-2 drinks a day for men. Know how much alcohol is in a drink. In the [place] one drink equals one 12 oz bottle of beer (355 mL), one 5 oz glass of wine (148 mL), or one 1 oz glass of hard liquor (44 mL). Do not use any products that contain nicotine or tobacco. These products include cigarettes, chewing tobacco, and vaping devices, such as e-cigarettes. If you need help quitting, ask your health care provider. Eat a healthy diet and maintain a healthy weight. Poor diet and excess weight may make pain worse. Eat foods that are high in fiber. These include fresh fruits and vegetables, whole grains, and beans. Limit foods that are high in fat and processed sugars, such as fried and sweet foods. Exercise regularly.  Exercise lowers stress and may help relieve pain. Ask your health care provider what activities and exercises are safe for you. If your health care provider approves, join an exercise class that combines movement and stress reduction. Examples include yoga and tai chi. Get enough sleep. Lack of sleep may make pain worse. Lower stress as much as possible. Practice stress reduction techniques as told by your therapist. General instructions Work with all your pain management providers to find the treatments that work best for you. You are an important member of your pain management team. There are many things you can do to reduce pain on your own. Consider joining an online or in-person support group for people who have chronic pain. Keep all follow-up visits. This is important. Where to find more information You can find more information about managing pain without opioids from: American Academy of Pain Medicine: painmed.org Institute for Chronic Pain: instituteforchronicpain.org American Chronic Pain Association: theacpa.org Contact a health care provider if: You have side effects from pain medicine. Your pain gets worse or does not get better with treatments or home therapy. You are struggling with anxiety or depression. Summary Many types of pain can be managed without opioids. Chronic pain may respond better to pain management without opioids. Pain is best managed when you and a team of health care providers work together. Pain management without opioids may include non-opioid medicines, medical treatments, physical therapy, mental health therapy, and lifestyle changes. Tell your health care providers if your pain gets worse or is not being managed well enough. This information is not intended to replace advice given to you by your health care provider. Make sure you discuss any questions you have with your health care provider. Document Revised: 02/05/2021 Document Reviewed:  02/05/2021 Elsevier Patient Education  2024 [employer].

## 2023-12-01 ENCOUNTER — Other Ambulatory Visit: Payer: Self-pay | Admitting: Internal Medicine

## 2023-12-01 DIAGNOSIS — E1100 Type 2 diabetes mellitus with hyperosmolarity without nonketotic hyperglycemic-hyperosmolar coma (NKHHC): Secondary | ICD-10-CM

## 2023-12-01 MED ORDER — FREESTYLE LIBRE 2 SENSOR MISC: 10 refills | Status: DC

## 2023-12-01 MED ORDER — FLUTICASONE PROPIONATE 50 MCG/ACT NA SUSP: 2.0000 | Freq: Two times a day (BID) | NASAL | 3 refills | Status: DC

## 2023-12-01 MED ORDER — FLUTICASONE-SALMETEROL 250-50 MCG/ACT IN AEPB: 1.0000 | INHALATION_SPRAY | Freq: Two times a day (BID) | RESPIRATORY_TRACT | 10 refills | Status: DC

## 2023-12-01 MED ORDER — FREESTYLE LIBRE 2 READER DEVI: 1.0000 | 0 refills | Status: DC

## 2023-12-01 NOTE — Telephone Encounter (Signed)
Copied from CRM [id number]. Topic: Clinical - Medication Refill >> Dec 01, 2023  4:08 PM Elle L wrote: Most Recent Primary Care Visit:  Provider: Recardo Evangelist A  Department: [facility]  Visit Type: MEDICARE AWV, SEQUENTIAL  Date: 11/30/2023  Medication: fluticasone (FLONASE) 50 MCG/ACT nasal spray AND fluticasone-salmeterol (ADVAIR) 250-50 MCG/ACT AEPB  AND Continuous Glucose Receiver (FREESTYLE LIBRE 2 READER) DEVI AND Continuous Glucose Sensor (FREESTYLE LIBRE 2 SENSOR) MISC   Has the patient contacted their pharmacy? Yes the pharmacy called on behalf   Is this the correct pharmacy for this prescription? Yes  [facility] - [address] [place], [place] - [phone] [place] [address] Phone: [phone] Fax: [phone]   Has the prescription been filled recently? No  Is the patient out of the medication? No  Has the patient been seen for an appointment in the last year OR does the patient have an upcoming appointment? Yes  Can we respond through MyChart? No  Agent: Please be advised that Rx refills may take up to 3 business days. We ask that you follow-up with your pharmacy.

## 2023-12-09 ENCOUNTER — Encounter (HOSPITAL_COMMUNITY): Payer: Self-pay | Admitting: Hematology

## 2023-12-10 ENCOUNTER — Ambulatory Visit (INDEPENDENT_AMBULATORY_CARE_PROVIDER_SITE_OTHER): Payer: 59 | Admitting: Internal Medicine

## 2023-12-10 ENCOUNTER — Other Ambulatory Visit (INDEPENDENT_AMBULATORY_CARE_PROVIDER_SITE_OTHER): Payer: Self-pay | Admitting: *Deleted

## 2023-12-10 ENCOUNTER — Encounter (INDEPENDENT_AMBULATORY_CARE_PROVIDER_SITE_OTHER): Payer: Self-pay | Admitting: *Deleted

## 2023-12-10 ENCOUNTER — Telehealth (INDEPENDENT_AMBULATORY_CARE_PROVIDER_SITE_OTHER): Payer: Self-pay | Admitting: *Deleted

## 2023-12-10 ENCOUNTER — Encounter: Payer: Self-pay | Admitting: Internal Medicine

## 2023-12-10 VITALS — BP 134/76 | HR 88 | Ht 69.0 in | Wt 200.2 lb

## 2023-12-10 DIAGNOSIS — H0014 Chalazion left upper eyelid: Secondary | ICD-10-CM | POA: Diagnosis not present

## 2023-12-10 DIAGNOSIS — H029 Unspecified disorder of eyelid: Secondary | ICD-10-CM | POA: Diagnosis not present

## 2023-12-10 DIAGNOSIS — K7460 Unspecified cirrhosis of liver: Secondary | ICD-10-CM

## 2023-12-10 NOTE — Assessment & Plan Note (Signed)
Presenting today for an acute visit for evaluation of a left upper eyelid lesion present x 2 weeks.  On exam there is a small well-circumscribed, pale lesion that appears most consistent with a chalazion.  Treatment options were reviewed.  I recommended applying a warm compress.  We reviewed that these lesions typically resolve on their own within a few weeks.  If the lesion increases in size, begins to cause significant discomfort, or obstructs his vision, will refer to ophthalmology.  He will return to care for previously scheduled follow-up in 02/15/2024.

## 2023-12-10 NOTE — Progress Notes (Signed)
   Acute Office Visit  Subjective:     Patient ID: JOSEJUAN HOAGLIN, male    DOB: 03-21-1956, 68 y.o.   MRN: [id number]  Chief Complaint  Patient presents with   spot    Spot on eye, itches and only painful when touched   Mr. Hattabaugh presents today for evaluation of a lesion on his left upper eyelid that has been present for multiple weeks.  He states that it does not cause any discomfort and is mildly tender when touched.  No changes to vision.  No irritation within his eye.  He has not tried applying any topical treatment or squeezing the lesion.  He also requests his medical records today for an upcoming legal case.  Review of Systems  Skin:        Skin lesion left upper eyelid  All other systems reviewed and are negative.     Objective:    BP 134/76 (BP Location: Left Arm, Patient Position: Sitting, Cuff Size: Normal)   Pulse 88   Ht 5\' 9"  (1.753 m)   Wt 200 lb 3.2 oz (90.8 kg)   SpO2 95%   BMI 29.56 kg/m   Physical Exam Eyes:     General:        Left eye: No discharge.     Extraocular Movements: Extraocular movements intact.     Conjunctiva/sclera: Conjunctivae normal.     Pupils: Pupils are equal, round, and reactive to light.  Skin:    General: Skin is warm and dry.     Comments: There is a small, well-circumscribed, pale lesion on the left upper eyelid that is most similar in appearance to a chalazion.       Assessment & Plan:   Problem List Items Addressed This Visit       Chalazion left upper eyelid   Presenting today for an acute visit for evaluation of a left upper eyelid lesion present x 2 weeks.  On exam there is a small well-circumscribed, pale lesion that appears most consistent with a chalazion.  Treatment options were reviewed.  I recommended applying a warm compress.  We reviewed that these lesions typically resolve on their own within a few weeks.  If the lesion increases in size, begins to cause significant discomfort, or obstructs his vision, will  refer to ophthalmology.  He will return to care for previously scheduled follow-up in 02/15/2024.      Return if symptoms worsen or fail to improve.  [name], MD

## 2023-12-10 NOTE — Telephone Encounter (Signed)
-----   Message from Nurse Maryclare Labrador sent at 09/07/2023  9:10 AM EDT ----- Regarding: labs Labs due per [place] in February cbc, inr, cmp, afp

## 2023-12-10 NOTE — Telephone Encounter (Signed)
Pt in reminder file for labs. I put in labs and and mailed to patient with a letter to do labs at [facility] in February.

## 2023-12-10 NOTE — Patient Instructions (Signed)
It was a pleasure to see you today.  Thank you for giving [place] the opportunity to be involved in your care.  Below is a brief recap of your visit and next steps.  We will plan to see you again in April.  Summary As discussed. I recommend applying a warm compress to your eye OK to use saline eye drops for irritation relief

## 2023-12-13 ENCOUNTER — Ambulatory Visit: Payer: 59

## 2023-12-14 ENCOUNTER — Other Ambulatory Visit: Payer: Self-pay | Admitting: Family Medicine

## 2023-12-14 DIAGNOSIS — Z794 Long term (current) use of insulin: Secondary | ICD-10-CM

## 2023-12-20 ENCOUNTER — Other Ambulatory Visit: Payer: Self-pay | Admitting: Internal Medicine

## 2023-12-28 ENCOUNTER — Other Ambulatory Visit: Payer: Self-pay

## 2023-12-28 DIAGNOSIS — Z794 Long term (current) use of insulin: Secondary | ICD-10-CM

## 2023-12-28 MED ORDER — FREESTYLE LIBRE 2 SENSOR MISC: 10 refills | Status: DC

## 2023-12-28 MED ORDER — CARVEDILOL 6.25 MG PO TABS: 6.2500 mg | ORAL_TABLET | Freq: Two times a day (BID) | ORAL | 3 refills | Status: DC

## 2023-12-28 MED ORDER — FREESTYLE LIBRE 2 READER DEVI: 1.0000 | 0 refills | Status: AC

## 2023-12-28 MED ORDER — TOPIRAMATE 50 MG PO TABS: 50.0000 mg | ORAL_TABLET | Freq: Two times a day (BID) | ORAL | 0 refills | Status: DC

## 2023-12-29 ENCOUNTER — Other Ambulatory Visit: Payer: Self-pay | Admitting: Internal Medicine

## 2023-12-29 DIAGNOSIS — Z794 Long term (current) use of insulin: Secondary | ICD-10-CM

## 2024-01-03 ENCOUNTER — Encounter (HOSPITAL_COMMUNITY): Payer: Self-pay | Admitting: Hematology

## 2024-01-10 ENCOUNTER — Ambulatory Visit: Payer: Medicare Other | Attending: Internal Medicine | Admitting: Audiologist

## 2024-01-18 ENCOUNTER — Other Ambulatory Visit: Payer: Self-pay | Admitting: Hematology

## 2024-01-19 ENCOUNTER — Inpatient Hospital Stay

## 2024-01-19 ENCOUNTER — Encounter (HOSPITAL_COMMUNITY): Payer: Self-pay | Admitting: Hematology

## 2024-01-19 ENCOUNTER — Other Ambulatory Visit: Payer: Self-pay

## 2024-01-19 DIAGNOSIS — E611 Iron deficiency: Secondary | ICD-10-CM

## 2024-01-19 DIAGNOSIS — I2692 Saddle embolus of pulmonary artery without acute cor pulmonale: Secondary | ICD-10-CM

## 2024-01-19 DIAGNOSIS — D696 Thrombocytopenia, unspecified: Secondary | ICD-10-CM

## 2024-01-21 ENCOUNTER — Inpatient Hospital Stay

## 2024-01-24 ENCOUNTER — Inpatient Hospital Stay

## 2024-01-24 ENCOUNTER — Inpatient Hospital Stay: Admitting: Hematology

## 2024-01-24 ENCOUNTER — Inpatient Hospital Stay: Attending: Hematology

## 2024-01-24 DIAGNOSIS — R161 Splenomegaly, not elsewhere classified: Secondary | ICD-10-CM | POA: Insufficient documentation

## 2024-01-24 DIAGNOSIS — K7460 Unspecified cirrhosis of liver: Secondary | ICD-10-CM | POA: Diagnosis not present

## 2024-01-24 DIAGNOSIS — I87002 Postthrombotic syndrome without complications of left lower extremity: Secondary | ICD-10-CM | POA: Diagnosis not present

## 2024-01-24 DIAGNOSIS — Z86718 Personal history of other venous thrombosis and embolism: Secondary | ICD-10-CM | POA: Diagnosis not present

## 2024-01-24 DIAGNOSIS — D696 Thrombocytopenia, unspecified: Secondary | ICD-10-CM | POA: Diagnosis not present

## 2024-01-24 DIAGNOSIS — Z7901 Long term (current) use of anticoagulants: Secondary | ICD-10-CM | POA: Insufficient documentation

## 2024-01-24 DIAGNOSIS — I2692 Saddle embolus of pulmonary artery without acute cor pulmonale: Secondary | ICD-10-CM

## 2024-01-24 DIAGNOSIS — E611 Iron deficiency: Secondary | ICD-10-CM

## 2024-01-24 DIAGNOSIS — D72819 Decreased white blood cell count, unspecified: Secondary | ICD-10-CM | POA: Insufficient documentation

## 2024-01-24 LAB — CBC WITH DIFFERENTIAL/PLATELET
Abs Immature Granulocytes: 0.01 10*3/uL (ref 0.00–0.07)
Basophils Absolute: 0 10*3/uL (ref 0.0–0.1)
Basophils Relative: 0 %
Eosinophils Absolute: 0.1 10*3/uL (ref 0.0–0.5)
Eosinophils Relative: 2 %
HCT: 44.7 % (ref 39.0–52.0)
Hemoglobin: 15.7 g/dL (ref 13.0–17.0)
Immature Granulocytes: 0 %
Lymphocytes Relative: 31 %
Lymphs Abs: 0.8 10*3/uL (ref 0.7–4.0)
MCH: 31.8 pg (ref 26.0–34.0)
MCHC: 35.1 g/dL (ref 30.0–36.0)
MCV: 90.7 fL (ref 80.0–100.0)
Monocytes Absolute: 0.2 10*3/uL (ref 0.1–1.0)
Monocytes Relative: 8 %
Neutro Abs: 1.6 10*3/uL — ABNORMAL LOW (ref 1.7–7.7)
Neutrophils Relative %: 59 %
Platelets: 73 10*3/uL — ABNORMAL LOW (ref 150–400)
RBC: 4.93 MIL/uL (ref 4.22–5.81)
RDW: 13 % (ref 11.5–15.5)
Smear Review: DECREASED
WBC: 2.7 10*3/uL — ABNORMAL LOW (ref 4.0–10.5)
nRBC: 0 % (ref 0.0–0.2)

## 2024-01-24 LAB — VITAMIN B12: Vitamin B-12: 250 pg/mL (ref 180–914)

## 2024-01-24 LAB — FOLATE: Folate: 13.3 ng/mL (ref >5.9)

## 2024-01-27 LAB — METHYLMALONIC ACID, SERUM: Methylmalonic Acid, Quantitative: 206 nmol/L (ref 0–378)

## 2024-01-31 ENCOUNTER — Inpatient Hospital Stay: Admitting: Hematology

## 2024-02-01 NOTE — Progress Notes (Signed)
 [facility] [address], [place] [address]    Clinic Day:  02/02/2024  Referring physician: [name], MD  Patient Care Team: [name], MD as PCP - General (Internal Medicine) Jonelle Sidle, MD as PCP - Cardiology (Cardiology) Doreatha Massed, MD as Medical Oncologist (Hematology) Michaelle Copas, MD as Referring Physician (Optometry) Oretha Milch, MD as Consulting Physician (Pulmonary Disease) Pesci, Ena Dawley, FNP (Dermatology) Doreatha Massed, MD as Consulting Physician (Hematology) Dolores Frame, MD as Consulting Physician (Gastroenterology)   ASSESSMENT & PLAN:   Assessment: 1.  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 - Presented to the ER on 12/09/2022 with intermittent midsternal pain radiating to the arm.  Chest x-ray was normal. - Was diagnosed with COVID with home test positive around 12/21/2022 (symptoms fever 1 day and cough still lingering) - Presented to ER again on 12/22/2022 with same symptoms. - Third presentation to the ER on 12/26/2022 with same symptoms. - CT angio chest: Small segmental and subsegmental pulmonary embolus in the right middle lobe with no evidence of right heart strain.  Scattered groundglass opacities in the right lung and at the left lung base, likely infectious or inflammatory.  Hepatic cirrhosis and splenomegaly. - Ultrasound legs (12/27/2022): Left profundofemoral vein DVT.  No evidence of DVT in the right leg. - Chest pain completely resolved after Eliquis started. - 20 pound weight loss since on [name].  No fevers or night sweats. - Initially it was thought to be thrombosis related to COVID.  However patient had presentation to the ER with similar symptoms of midsternal pain radiating to the arm even prior to COVID infection on 12/09/2022.  Because  of his sedentary job, it was classified as weakly provoked to unprovoked. - Testing for lupus anticoagulant, anticardiolipin and antibeta 2 glycoprotein antibodies negative.   2.  Social/family history: - Lives at home with his wife.  He has been on disability for 19 years and recently started working as a Electrical engineer at [employer] for the last 1 year.  He is non-smoker.  Nonalcoholic. - No family history of thrombosis.  Mother had 1 miscarriage. - Paternal grandfather had cancer.    Plan: 1.  Unprovoked DVT/PE: - He is tolerating Eliquis very well.  Denies any bleeding issues or easy bruising. - He is continuing to work as a Electrical engineer, mostly sedentary. - I have recommended continuing anticoagulation at this time as benefits outweigh risks. - Because of his thrombocytopenia, will closely monitor.  RTC 6 months with repeat CBC and risk-benefit analysis to continue anticoagulation.   2.  Mild leukopenia and moderate thrombocytopenia: - He has evidence of cirrhosis with splenomegaly on CT scan. - B12, MMA and folic acid levels on 01/24/2024 is normal.  White count and platelet count are low but stable at 2.7 and 73K respectively. - He follows up with GI service for cirrhosis.  3.  Postphlebitic syndrome of the left leg: - He reports on and off swelling of the left leg, likely from post phlebitis syndrome. - Recommend wearing compression socks.  He may try 15-20 mmHg compression socks initially and increase to 20-30 mmHg if still has swelling.    Orders Placed This Encounter  Procedures   CBC with Differential    Standing Status:   Future    Expected  Date:   07/31/2024    Expiration Date:   02/01/2025   D-dimer, quantitative    Standing Status:   Future    Expected Date:   07/31/2024    Expiration Date:   02/01/2025      [name] [name],acting as a scribe for Doreatha Massed, MD.,have documented all relevant documentation on the behalf of Doreatha Massed, MD,as  directed by  Doreatha Massed, MD while in the presence of Doreatha Massed, MD.   I, Doreatha Massed MD, have reviewed the above documentation for accuracy and completeness, and I agree with the above.   Doreatha Massed, MD   3/26/20253:06 PM  CHIEF COMPLAINT:   Diagnosis: DVT/PE    Cancer Staging  No matching staging information was found for the patient.    Prior Therapy: none  Current Therapy:  Eliquis   HISTORY OF PRESENT ILLNESS:   Oncology History   No history exists.     INTERVAL HISTORY:   [name] [name] is a 68 y.o. male presenting to clinic today for follow up of DVT/PE. He was last seen by me on 04/08/23.  Since his last visit, he underwent a colonoscopy on 06/28/24 under Dr. Levon Hedger.   Today, he states that he is doing well overall. His appetite level is at 100%. His energy level is at 100%.  PAST MEDICAL HISTORY:   Past Medical History: Past Medical History:  Diagnosis Date   Anxiety    Aortic insufficiency    Arthritis    Asthma    Cataract    Right eye   Chronic lower back pain    Cirrhosis of liver (HCC)    Coronary atherosclerosis of native coronary artery    a. 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   LIMA to LAD, free RIMA to OM2, SVG to PDA, open SVG harvest from right thigh   Stroke (HCC)    Thrombocytopenia (HCC)    TIA (transient ischemic attack)    Type 2 diabetes mellitus (HCC)     Surgical History: Past Surgical History:  Procedure Laterality Date   ANTERIOR CERVICAL DECOMP/DISCECTOMY FUSION  1998   "C3-4"   CARDIAC CATHETERIZATION  "several"    CARDIAC CATHETERIZATION N/A 12/30/2015   Procedure: Left Heart Cath and Coronary Angiography;  Surgeon: Runell Gess, MD;  [facility]: [facility] IN[facility]ASIVE CV LAB;  Service: Cardiovascular;  Laterality: N/A;   CARPAL TUNNEL RELEASE Bilateral 2005   CHOLECYSTECTOMY N/A 11/28/2018   Procedure: LAPAROSCOPIC CHOLECYSTECTOMY;  Surgeon: Lucretia Roers, MD;  Location: [facility] ORS;  Service: General;  Laterality: N/A;   COLONOSCOPY     COLONOSCOPY WITH PROPOFOL N/A 09/27/2020   Procedure: COLONOSCOPY WITH PROPOFOL;  Surgeon: Dolores Frame, MD;  Location: [facility] END[facility] SU[facility];  Service: Gastroenterology;  Laterality: N/A;   COLONOSCOPY WITH PROPOFOL N/A 06/29/2023   Procedure: COLONOSCOPY WITH PROPOFOL;  Surgeon: Dolores Frame, MD;  Location: [facility];  Service: Gastroenterology;  Laterality: N/A;  1:30PM;ASA 3   CORONARY ANGIOPLASTY     CORONARY ANGIOPLASTY  WITH STENT PLACEMENT  2002; 2003; 11/20/2014   "I have 4 stents after today" (11/20/2014)   CORONARY ARTERY BYPASS GRAFT N/A 01/09/2016   Procedure: CORONARY ARTERY BYPASS GRAFTING (CABG) X 3 UTILIZING RIGHT AND LEFT INTERNAL MAMMARY ARTERY AND ENDOSCOPICALLY HARVESTED SAPHENEOUS VEIN.;  Surgeon: Purcell Nails, MD;  Location: [facility] OR;  Service: Open Heart Surgery;  Laterality: N/A;   ESOPHAGOGASTRODUODENOSCOPY     ESOPHAGOGASTRODUODENOSCOPY (EGD) WITH PROPOFOL N/A 09/27/2020   Procedure: ESOPHAGOGASTRODUODENOSCOPY (EGD) WITH PROPOFOL;  Surgeon: Dolores Frame, MD;  [facility]: [facility];  Service: Gastroenterology;  Laterality: N/A;  10   ESOPHAGOGASTRODUODENOSCOPY (EGD) WITH PROPOFOL N/A 06/29/2023   Procedure: ESOPHAGOGASTRODUODENOSCOPY (EGD) WITH PROPOFOL;  Surgeon: Dolores Frame, MD;  [facility]: [facility];  Service: Gastroenterology;  Laterality: N/A;  1:30PM;ASA 3   KNEE SURGERY Left 02/2012   "scraped; open"   LEFT HEART CATH AND CORS/GRAFTS ANGIOGRAPHY N/A 08/09/2017   Procedure: LEFT HEART CATH AND  CORS/GRAFTS ANGIOGRAPHY;  Surgeon: Yvonne Kendall, MD;  [facility];  Service: Cardiovascular;  Laterality: N/A;   LEFT HEART CATH AND CORS/GRAFTS ANGIOGRAPHY N/A 11/22/2018   Procedure: LEFT HEART CATH AND CORS/GRAFTS ANGIOGRAPHY;  Surgeon: Swaziland, [name], MD;  Location: [facility] INVASIVE CV LAB;  Service: Cardiovascular;  Laterality: N/A;   LEFT HEART CATH AND CORS/GRAFTS ANGIOGRAPHY N/A 08/20/2022   Procedure: LEFT HEART CATH AND CORS/GRAFTS ANGIOGRAPHY;  Surgeon: Kathleene Hazel, MD;  [facility]: [facility];  Service: Cardiovascular;  Laterality: N/A;   LEFT HEART CATHETERIZATION WITH CORONARY ANGIOGRAM N/A 07/20/2012   Procedure: LEFT HEART CATHETERIZATION WITH CORONARY ANGIOGRAM;  Surgeon: Iran Ouch, MD;  [facility]: [facility] C[facility];  Service: Cardiovascular;  Laterality: N/A;   LEFT HEART CATHETERIZATION WITH CORONARY ANGIOGRAM N/A 11/20/2014   Procedure: LEFT HEART CATHETERIZATION WITH CORONARY ANGIOGRAM;  Surgeon: [name] Swaziland, MD;  Location: [facility] CATH LAB;  Service: Cardiovascular;  Laterality: N/A;   LEFT HEART CATHETERIZATION WITH CORONARY ANGIOGRAM N/A 11/26/2014   Procedure: LEFT HEART CATHETERIZATION WITH CORONARY ANGIOGRAM;  Surgeon: [name] Swaziland, MD;  Location: [facility] CATH LAB;  Service: Cardiovascular;  Laterality: N/A;   NEUROPLASTY / TRANSPOSITION ULNAR NERVE AT ELBOW Right ~ 2012   PERCUTANEOUS CORONARY ROTOBLATOR INTERVENTION (PCI-R)  11/20/2014   Procedure: PERCUTANEOUS CORONARY ROTOBLATOR INTERVENTION (PCI-R);  Surgeon: [name] Swaziland, MD;  Location: [facility] CATH LAB;  Service: Cardiovascular;;   POSTERIOR CERVICAL LAMINECTOMY Left 04/16/2022   Procedure: Laminectomy and Foraminotomy - left - C6-C7;  Surgeon: Julio Sicks, MD;  Location: [facility] OR;  Service: Neurosurgery;  Laterality: Left;  3C   SHOULDER ARTHROSCOPY Left ~ 2011   TEE WITHOUT CARDIOVERSION N/A 01/09/2016   Procedure: TRANSESOPHAGEAL ECHOCARDIOGRAM (TEE);  Surgeon: Purcell Nails, MD;  Location: [facility]  OR;  Service: Open Heart Surgery;  Laterality: N/A;    Social History: Social History   Socioeconomic History   Marital status: Married    Spouse name: [name]   Number of children: 3   Years of education: 12 th    Highest education level: Not on file  Occupational History   Occupation: Unemployed  Tobacco Use   Smoking status: Never    Passive exposure: Current   Smokeless tobacco: Never  Vaping Use   Vaping status: Never Used  Substance and Sexual Activity   Alcohol use: No   Drug use: No   Sexual activity: Not on file  Other Topics Concern   Not on file  Social History Narrative   Patient  lives at home with wife [name].   Patient works at [employer], Social research officer, government.   Patient has a 12 th grade education.    Patient has 3 children.       Updated 11/30/2023:   Works at [employer] as a Engineer, materials      Social Drivers of Corporate investment banker Strain: Low Risk  (11/30/2023)   Overall Financial Resource Strain (CARDIA)    Difficulty of Paying Living Expenses: Not hard at all  Food Insecurity: No Food Insecurity (11/30/2023)   Hunger Vital Sign    Worried About Running Out of Food in the Last Year: Never true    Ran Out of Food in the Last Year: Never true  Transportation Needs: No Transportation Needs (11/30/2023)   PRAPARE - Administrator, Civil Service (Medical): No    Lack of Transportation (Non-Medical): No  Physical Activity: Sufficiently Active (11/30/2023)   Exercise Vital Sign    Days of Exercise per Week: 3 days    Minutes of Exercise per Session: 60 min  Stress: No Stress Concern Present (11/30/2023)   [employer] of Occupational Health - Occupational Stress Questionnaire    Feeling of Stress : Not at all  Social Connections: Socially Integrated (11/30/2023)   Social Connection and Isolation Panel [NHANES]    Frequency of Communication with Friends and Family: More than three times a week    Frequency of Social Gatherings  with Friends and Family: More than three times a week    Attends Religious Services: More than 4 times per year    Active Member of [employer] or Organizations: Yes    Attends Engineer, structural: More than 4 times per year    Marital Status: Married  Catering manager Violence: Not At Risk (11/30/2023)   Humiliation, Afraid, Rape, and Kick questionnaire    Fear of Current or Ex-Partner: No    Emotionally Abused: No    Physically Abused: No    Sexually Abused: No    Family History: Family History  Problem Relation Age of Onset   Stroke Mother    Coronary artery disease Father 75   Asthma Sister    Multiple sclerosis Brother    Heart disease Paternal Uncle    Stroke Maternal Grandmother    Heart attack Paternal Grandmother    Cancer Paternal Grandfather    Asthma Sister    Seizures Son    Migraines Son    Autism Son    Migraines Son     Current Medications:  Current Outpatient Medications:    albuterol (VENTOLIN HFA) 108 (90 Base) MCG/ACT inhaler, Inhale 2 puffs into the lungs every 6 (six) hours as needed for wheezing or shortness of breath., Disp: , Rfl:    carvedilol (COREG) 6.25 MG tablet, Take 1 tablet (6.25 mg total) by mouth 2 (two) times daily with a meal., Disp: 180 tablet, Rfl: 3   citalopram (CELEXA) 20 MG tablet, TAKE 1 TABLET BY MOUTH AT BEDTIME., Disp: 30 tablet, Rfl: 10   Continuous Glucose Receiver (FREESTYLE LIBRE 2 READER) DEVI, 1 Device by Does not apply route continuous., Disp: 1 each, Rfl: 0   Continuous Glucose Sensor (FREESTYLE LIBRE 2 SENSOR) MISC, USE 1 EACH FOR 14 DAYS, Disp: 2 each, Rfl: 10   cyclobenzaprine (FLEXERIL) 10 MG tablet, TAKE 1 TABLET BY MOUTH 3 TIMES DAILY AS NEEDED FOR MUSCLE SPASMS, Disp: 30 tablet, Rfl: 10   ELIQUIS 5 MG TABS tablet, TAKE 1 TABLET  BY MOUTH TWICE DAILY, Disp: 60 tablet, Rfl: 10   ezetimibe (ZETIA) 10 MG tablet, TAKE 1 TABLET BY MOUTH ONCE DAILY, Disp: 30 tablet, Rfl: 10   FARXIGA 5 MG TABS tablet, TAKE 1 TABLET BY  MOUTH DAILY BEFORE BREAKFAST, Disp: 30 tablet, Rfl: 10   fluorouracil (EFUDEX) 5 % cream, Apply topically 2 (two) times daily., Disp: 40 g, Rfl: 0   fluticasone (FLONASE) 50 MCG/ACT nasal spray, Place 2 sprays into both nostrils 2 (two) times daily., Disp: 15.8 mL, Rfl: 3   fluticasone-salmeterol (ADVAIR) 250-50 MCG/ACT AEPB, Inhale 1 puff into the lungs 2 (two) times daily., Disp: 60 each, Rfl: 10   gabapentin (NEURONTIN) 300 MG capsule, TAKE 1 CAPSULE BY MOUTH THREE TIMES DAILY, Disp: 90 capsule, Rfl: 10   isosorbide mononitrate (IMDUR) 60 MG 24 hr tablet, TAKE 1 TABLET (60MG ) BY MOUTH IN THE MORNING AND 1/2 TABLET (30MG ) IN THE EVENING, Disp: 45 tablet, Rfl: 10   Melatonin 10 MG TABS, Take 10 mg by mouth at bedtime., Disp: , Rfl:    [name] 5 MG/0.5ML Pen, INJECT 5MG  SUBCUTANEOUSLY ONCE WEEKLY, Disp: 2 mL, Rfl: 10   nitroGLYCERIN (NITROSTAT) 0.4 MG SL tablet, DISSOLVE 1 TABLET UNDER THE TONGUE AS NEEDED FOR CHEST PAIN EVERY 5 MINUTES UP TO 3 TIMES. IF NO RELIEF CALL 911., Disp: 25 tablet, Rfl: 10   omeprazole (PRILOSEC) 40 MG capsule, Take 1 capsule (40 mg total) by mouth daily., Disp: 30 capsule, Rfl: 10   oxyCODONE-acetaminophen (PERCOCET/ROXICET) 5-325 MG tablet, Take 1-2 tablets by mouth every 6 (six) hours as needed for severe pain (pain score 7-10)., Disp: 15 tablet, Rfl: 0   pravastatin (PRAVACHOL) 20 MG tablet, TAKE 1 TABLET BY MOUTH DAILY., Disp: 30 tablet, Rfl: 10   topiramate (TOPAMAX) 50 MG tablet, Take 1 tablet (50 mg total) by mouth 2 (two) times daily., Disp: 180 tablet, Rfl: 0   zonisamide (ZONEGRAN) 100 MG capsule, Take 100 mg by mouth daily., Disp: , Rfl:    Allergies: Allergies  Allergen Reactions   Divalproex Sodium Other (See Comments)    Causes anger   Statins Other (See Comments)    Muscle aches and cramps   Tramadol Other (See Comments)    Chest pain    Valproic Acid Other (See Comments)    Causes anger   Gadolinium Derivatives Nausea And Vomiting    07/25/19 Pt  vomited immediately after IV gad. Denies itching, dyspnea.  (Adverse, not allergic, reaction   Tricor [Fenofibrate] Other (See Comments)    Leg cramps    REVIEW OF SYSTEMS:   Review of Systems  Constitutional:  Negative for chills, fatigue and fever.  HENT:   Negative for lump/mass, mouth sores, nosebleeds, sore throat and trouble swallowing.   Eyes:  Negative for eye problems.  Respiratory:  Negative for cough and shortness of breath.   Cardiovascular:  Negative for chest pain, leg swelling and palpitations.  Gastrointestinal:  Positive for diarrhea. Negative for abdominal pain, constipation, nausea and vomiting.  Genitourinary:  Negative for bladder incontinence, difficulty urinating, dysuria, frequency, hematuria and nocturia.   Musculoskeletal:  Negative for arthralgias, back pain, flank pain, myalgias and neck pain.  Skin:  Negative for itching and rash.  Neurological:  Positive for dizziness. Negative for headaches and numbness.  Hematological:  Does not bruise/bleed easily.  Psychiatric/Behavioral:  Positive for sleep disturbance. Negative for depression and suicidal ideas. The patient is not nervous/anxious.   All other systems reviewed and are negative.    VITALS:  Blood pressure (!) 156/77, pulse 71, temperature 97.8 F (36.6 C), temperature source Tympanic, resp. rate 18, weight 214 lb (97.1 kg), SpO2 98%.  Wt Readings from Last 3 Encounters:  02/02/24 214 lb (97.1 kg)  12/10/23 200 lb 3.2 oz (90.8 kg)  11/30/23 200 lb (90.7 kg)    Body mass index is 31.6 kg/m.  Performance status (ECOG): 1 - Symptomatic but completely ambulatory  PHYSICAL EXAM:   Physical Exam Vitals and nursing note reviewed. Exam conducted with a chaperone present.  Constitutional:      Appearance: Normal appearance.  Cardiovascular:     Rate and Rhythm: Normal rate and regular rhythm.     Pulses: Normal pulses.     Heart sounds: Normal heart sounds.  Pulmonary:     Effort: Pulmonary  effort is normal.     Breath sounds: Normal breath sounds.  Abdominal:     Palpations: Abdomen is soft. There is no hepatomegaly, splenomegaly or mass.     Tenderness: There is no abdominal tenderness.  Musculoskeletal:     Right lower leg: No edema.     Left lower leg: No edema.  Lymphadenopathy:     Cervical: No cervical adenopathy.     Right cervical: No superficial, deep or posterior cervical adenopathy.    Left cervical: No superficial, deep or posterior cervical adenopathy.     Upper Body:     Right upper body: No supraclavicular or axillary adenopathy.     Left upper body: No supraclavicular or axillary adenopathy.  Neurological:     General: No focal deficit present.     Mental Status: He is alert and oriented to person, place, and time.  Psychiatric:        Mood and Affect: Mood normal.        Behavior: Behavior normal.     LABS:   CBC     Component Value Date/Time   WBC 2.7 (L) 01/24/2024 1013   RBC 4.93 01/24/2024 1013   HGB 15.7 01/24/2024 1013   HGB 13.5 03/08/2023 0901   HCT 44.7 01/24/2024 1013   HCT 41.6 03/08/2023 0901   PLT 73 (L) 01/24/2024 1013   PLT 88 (LL) 03/08/2023 0901   MCV 90.7 01/24/2024 1013   MCV 95 03/08/2023 0901   MCH 31.8 01/24/2024 1013   MCHC 35.1 01/24/2024 1013   RDW 13.0 01/24/2024 1013   RDW 15.0 03/08/2023 0901   LYMPHSABS 0.8 01/24/2024 1013   LYMPHSABS 0.9 03/08/2023 0901   MONOABS 0.2 01/24/2024 1013   EOSABS 0.1 01/24/2024 1013   EOSABS 0.1 03/08/2023 0901   BASOSABS 0.0 01/24/2024 1013   BASOSABS 0.0 03/08/2023 0901    CMP      Component Value Date/Time   NA 139 10/17/2023 1344   NA 140 03/08/2023 0901   K 4.1 10/17/2023 1344   CL 107 10/17/2023 1344   CO2 25 10/17/2023 1344   GLUCOSE 120 (H) 10/17/2023 1344   BUN 16 10/17/2023 1344   BUN 8 03/08/2023 0901   CREATININE 1.00 10/17/2023 1344   CREATININE 1.04 08/15/2020 1451   CALCIUM 9.2 10/17/2023 1344   PROT 6.5 10/17/2023 1344   PROT 7.2 09/29/2022  1607   ALBUMIN 3.4 (L) 10/17/2023 1344   ALBUMIN 3.3 (L) 01/30/2023 1508   AST 28 10/17/2023 1344   ALT 25 10/17/2023 1344   ALKPHOS 42 10/17/2023 1344   BILITOT 0.7 10/17/2023 1344   BILITOT 0.4 09/29/2022 1607   GFRNONAA >60 10/17/2023 1344  GFRAA >60 07/27/2020 0525     No results found for: "CEA1", "CEA" / No results found for: "CEA1", "CEA" No results found for: "PSA1" No results found for: "CAN199" No results found for: "CAN125"  Lab Results  Component Value Date   TOTALPROTELP 7.8 12/26/2018   ALBUMINELP 3.1 12/26/2018   A1GS 0.3 12/26/2018   A2GS 1.0 12/26/2018   BETS 1.6 (H) 12/26/2018   GAMS 1.8 12/26/2018   MSPIKE Not Observed 12/26/2018   SPEI Comment 12/26/2018   Lab Results  Component Value Date   TIBC 388 08/20/2021   TIBC 402 02/18/2021   TIBC 374 05/31/2020   FERRITIN 27 08/20/2021   FERRITIN 36 02/18/2021   FERRITIN 100 05/31/2020   IRONPCTSAT 29 08/20/2021   IRONPCTSAT 25 02/18/2021   IRONPCTSAT 27 05/31/2020   Lab Results  Component Value Date   LDH 157 02/18/2021   LDH 138 08/30/2019     STUDIES:   No results found.

## 2024-02-02 ENCOUNTER — Inpatient Hospital Stay: Admitting: Hematology

## 2024-02-02 VITALS — BP 156/77 | HR 71 | Temp 97.8°F | Resp 18 | Wt 214.0 lb

## 2024-02-02 DIAGNOSIS — I2692 Saddle embolus of pulmonary artery without acute cor pulmonale: Secondary | ICD-10-CM

## 2024-02-02 DIAGNOSIS — Z86718 Personal history of other venous thrombosis and embolism: Secondary | ICD-10-CM | POA: Diagnosis not present

## 2024-02-02 DIAGNOSIS — I87002 Postthrombotic syndrome without complications of left lower extremity: Secondary | ICD-10-CM | POA: Diagnosis not present

## 2024-02-02 DIAGNOSIS — Z7901 Long term (current) use of anticoagulants: Secondary | ICD-10-CM | POA: Diagnosis not present

## 2024-02-02 DIAGNOSIS — K7460 Unspecified cirrhosis of liver: Secondary | ICD-10-CM | POA: Diagnosis not present

## 2024-02-02 DIAGNOSIS — D696 Thrombocytopenia, unspecified: Secondary | ICD-10-CM | POA: Diagnosis not present

## 2024-02-02 DIAGNOSIS — R161 Splenomegaly, not elsewhere classified: Secondary | ICD-10-CM | POA: Diagnosis not present

## 2024-02-02 DIAGNOSIS — D72819 Decreased white blood cell count, unspecified: Secondary | ICD-10-CM | POA: Diagnosis not present

## 2024-02-02 NOTE — Patient Instructions (Addendum)
 [facility] at [facility] Discharge Instructions   You were seen and examined today by Dr. Ellin Saba.  He reviewed the results of your lab work which are normal/stable.   Dr. Kirtland Bouchard recommends you continue the Eliquis. We will need to continue to monitor your blood work closely to keep an eye on your platelets.   We will see you back in 6 months. We will repeat lab work at this time.   Return as scheduled.    Thank you for choosing [facility] at [facility] to provide your oncology and hematology care.  To afford each patient quality time with our provider, please arrive at least 15 minutes before your scheduled appointment time.   If you have a lab appointment with the Cancer Center please come in thru the Main Entrance and check in at the main information desk.  You need to re-schedule your appointment should you arrive 10 or more minutes late.  We strive to give you quality time with our providers, and arriving late affects you and other patients whose appointments are after yours.  Also, if you no show three or more times for appointments you may be dismissed from the clinic at the providers discretion.     Again, thank you for choosing [facility].  Our hope is that these requests will decrease the amount of time that you wait before being seen by our physicians.       _____________________________________________________________  Should you have questions after your visit to [facility], please contact our office at [phone] and follow the prompts.  Our office hours are 8:00 a.m. and 4:30 p.m. Monday - Friday.  Please note that voicemails left after 4:00 p.m. may not be returned until the following business day.  We are closed weekends and major holidays.  You do have access to a nurse 24-7, just call the main number to the clinic [phone] and do not press any options, hold on the line and a nurse will answer  the phone.    For prescription refill requests, have your pharmacy contact our office and allow 72 hours.    Due to Covid, you will need to wear a mask upon entering the hospital. If you do not have a mask, a mask will be given to you at the Main Entrance upon arrival. For doctor visits, patients may have 1 support person age 11 or older with them. For treatment visits, patients can not have anyone with them due to social distancing guidelines and our immunocompromised population.

## 2024-02-14 ENCOUNTER — Other Ambulatory Visit: Payer: Self-pay | Admitting: Internal Medicine

## 2024-02-14 ENCOUNTER — Ambulatory Visit: Attending: Internal Medicine | Admitting: Audiologist

## 2024-02-14 DIAGNOSIS — H903 Sensorineural hearing loss, bilateral: Secondary | ICD-10-CM

## 2024-02-14 NOTE — Procedures (Signed)
  Outpatient Audiology and [facility] [address] [place], [place]  [address] [phone]  AUDIOLOGICAL  EVALUATION  NAME: [name] [name]     DOB:   [birth year]      MRN: [id number]                                                                                     DATE: 02/14/2024     REFERENT: [name], MD STATUS: Outpatient DIAGNOSIS: Asymmetric Sensorineural Hearing [place]   History: [name] [name] was seen for an audiological evaluation due to difficulty hearing. He has had a hearing testing before and is aware he has loss. The left ear has been worse for many years. He has had several stroked and his left shoulder droops.  [name] [name] denies pain or pressure in either ear.  [name] [name] no significant history of hazardous noise exposure.  Medical history shows uncontrolled diabetes and stoke which are risks for hearing loss.    Evaluation:  Otoscopy showed a clear view of the tympanic membranes, bilaterally Tympanometry results were consistent with normal middle ear function, bilaterally   Audiometric testing was completed using Conventional Audiometry techniques with insert earphones and supraural headphones. Test results are consistent with mild to moderately severe sloping sensorineural hearing bilaterally  with the left ear worse 250-2kHz. Speech Recognition Thresholds were obtained at  20dB HL in the right ear and at 45dB HL in the left ear. Word Recognition Testing was completed at  40dB SL and [name] [name] scored 92% in the right ear and 100% in the left.    Results:  The test results were reviewed with [name] [name] and his wife. 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 He needs hearing aids and Otolaryngology medical clearance due to asymmetry. [name] [name] is open to hearing aids.  Audiogram printed and provided to [name] [name].    Recommendations: Hearing aids recommended for both ears. Patient given list of local hearing aid providers.  Annual audiometric testing  recommended to monitor hearing loss for progression.  Recommend referral to Otolaryngology due to asymmetric hearing loss.    32 minutes spent testing and counseling on results.   If you have any questions please feel free to contact me at [phone].  Ammie Ferrier Stalnaker Au.D.  Audiologist   02/14/2024  11:22 AM  Cc: [name], MD

## 2024-02-15 ENCOUNTER — Ambulatory Visit (INDEPENDENT_AMBULATORY_CARE_PROVIDER_SITE_OTHER): Payer: 59 | Admitting: Internal Medicine

## 2024-02-15 ENCOUNTER — Encounter: Payer: Self-pay | Admitting: Internal Medicine

## 2024-02-15 VITALS — BP 119/69 | HR 63 | Ht 69.0 in | Wt 221.4 lb

## 2024-02-15 DIAGNOSIS — E1129 Type 2 diabetes mellitus with other diabetic kidney complication: Secondary | ICD-10-CM | POA: Diagnosis not present

## 2024-02-15 DIAGNOSIS — I824Z2 Acute embolism and thrombosis of unspecified deep veins of left distal lower extremity: Secondary | ICD-10-CM

## 2024-02-15 DIAGNOSIS — E1100 Type 2 diabetes mellitus with hyperosmolarity without nonketotic hyperglycemic-hyperosmolar coma (NKHHC): Secondary | ICD-10-CM | POA: Diagnosis not present

## 2024-02-15 DIAGNOSIS — G4733 Obstructive sleep apnea (adult) (pediatric): Secondary | ICD-10-CM | POA: Diagnosis not present

## 2024-02-15 DIAGNOSIS — I10 Essential (primary) hypertension: Secondary | ICD-10-CM

## 2024-02-15 DIAGNOSIS — H903 Sensorineural hearing loss, bilateral: Secondary | ICD-10-CM

## 2024-02-15 DIAGNOSIS — E782 Mixed hyperlipidemia: Secondary | ICD-10-CM

## 2024-02-15 DIAGNOSIS — Z794 Long term (current) use of insulin: Secondary | ICD-10-CM

## 2024-02-15 DIAGNOSIS — R809 Proteinuria, unspecified: Secondary | ICD-10-CM | POA: Diagnosis not present

## 2024-02-15 DIAGNOSIS — E559 Vitamin D deficiency, unspecified: Secondary | ICD-10-CM | POA: Diagnosis not present

## 2024-02-15 MED ORDER — MOUNJARO 5 MG/0.5ML ~~LOC~~ SOAJ: 5.0000 mg | SUBCUTANEOUS | 10 refills | Status: DC

## 2024-02-15 NOTE — Assessment & Plan Note (Signed)
 He endorses nightly compliance with CPAP in the setting of severe OSA.  Due for pulmonology follow-up.  Will assist with scheduling today.

## 2024-02-15 NOTE — Assessment & Plan Note (Signed)
 He is currently prescribed pravastatin 20 mg daily and ezetimibe 10 mg daily.  Lipid panel last updated in March 2024 reflects adequate control on this regimen.  Repeat lipid panel ordered today.

## 2024-02-15 NOTE — Assessment & Plan Note (Signed)
 A1c 5.8 on labs from October 2024.  He is currently prescribed [name] 5 mg weekly and Farxiga 10 mg daily.  Currently off [name] x 2 weeks.  Refill provided today.  Repeat A1c and urine microalbumin/creatinine ratio ordered.

## 2024-02-15 NOTE — Progress Notes (Signed)
 Established Patient Office Visit  Subjective   Patient ID: [name] [name], male    DOB: [birth year]  Age: 68 y.o. MRN: [id number]  Chief Complaint  Patient presents with   Care Management    Six month follow up    Referral    Referral to ENT, patient took a hearing test and needs hearing aids   [name] [name] returns to care today for routine follow-up.  He was last evaluated by me for an acute visit on 1/31 for a left upper eyelid lesion that appears most consistent with a chalazion.  Conservative treatment measures recommended.  In the interim, he has been seen by oncology in the setting of unprovoked DVT/PE.  Previously seen by me for routine follow-up in December 2024. [name] [name] reports feeling well today and has no acute concerns to discuss.   Past Medical History:  Diagnosis Date   Anxiety    Aortic insufficiency    Arthritis    Asthma    Cataract    Right eye   Chronic lower back pain    Cirrhosis of liver (HCC)    Coronary atherosclerosis of native coronary artery    a. 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   LIMA to LAD, free RIMA to OM2, SVG to PDA, open SVG harvest from right thigh   Stroke (HCC)    Thrombocytopenia (HCC)    TIA (transient ischemic attack)    Type 2 diabetes mellitus (HCC)    Past Surgical History:  Procedure Laterality Date   ANTERIOR CERVICAL DECOMP/DISCECTOMY FUSION  1998   "C3-4"   CARDIAC CATHETERIZATION  "several"   CARDIAC CATHETERIZATION N/A 12/30/2015   Procedure: Left Heart Cath and Coronary Angiography;  Surgeon: Runell Gess, MD;  Location: [facility];   Service: Cardiovascular;  Laterality: N/A;   CARPAL TUNNEL RELEASE Bilateral 2005   CHOLECYSTECTOMY N/A 11/28/2018   Procedure: LAPAROSCOPIC CHOLECYSTECTOMY;  Surgeon: Lucretia Roers, MD;  Location: [facility] ORS;  Service: General;  Laterality: N/A;   COLONOSCOPY     COLONOSCOPY WITH PROPOFOL N/A 09/27/2020   Procedure: COLONOSCOPY WITH PROPOFOL;  Surgeon: Dolores Frame, MD;  Location: [facility];  Service: Gastroenterology;  Laterality: N/A;   COLONOSCOPY WITH PROPOFOL N/A 06/29/2023   Procedure: COLONOSCOPY WITH PROPOFOL;  Surgeon: Dolores Frame, MD;  Location: [facility];  Service: Gastroenterology;  Laterality: N/A;  1:30PM;ASA 3   CORONARY ANGIOPLASTY     CORONARY ANGIOPLASTY WITH STENT PLACEMENT  2002; 2003; 11/20/2014   "I have 4 stents after today" (11/20/2014)   CORONARY ARTERY BYPASS GRAFT N/A 01/09/2016   Procedure: CORONARY ARTERY BYPASS GRAFTING (CABG) X 3 UTILIZING RIGHT AND LEFT INTERNAL MAMMARY ARTERY AND ENDOSCOPICALLY HARVESTED SAPHENEOUS VEIN.;  Surgeon: Purcell Nails, MD;  Location: [facility] OR;  Service: Open Heart Surgery;  Laterality: N/A;   ESOPHAGOGASTRODUODENOSCOPY     ESOPHAGOGASTRODUODENOSCOPY (EGD) WITH PROPOFOL N/A 09/27/2020   Procedure: ESOPHAGOGASTRODUODENOSCOPY (EGD) WITH PROPOFOL;  Surgeon: Dolores Frame, MD;  Location:  [facility];  Service: Gastroenterology;  Laterality: N/A;  10   ESOPHAGOGASTRODUODENOSCOPY (EGD) WITH PROPOFOL N/A 06/29/2023   Procedure: ESOPHAGOGASTRODUODENOSCOPY (EGD) WITH PROPOFOL;  Surgeon: Dolores Frame, MD;  [facility]: [facility];  Service: Gastroenterology;  Laterality: N/A;  1:30PM;ASA 3   KNEE SURGERY Left 02/2012   "scraped; open"   LEFT HEART CATH AND CORS/GRAFTS ANGIOGRAPHY N/A 08/09/2017   Procedure: LEFT HEART CATH AND CORS/GRAFTS ANGIOGRAPHY;  Surgeon: Yvonne Kendall, MD;  [facility]: [facility];  Service: Cardiovascular;  Laterality: N/A;   LEFT HEART CATH AND CORS/GRAFTS  ANGIOGRAPHY N/A 11/22/2018   Procedure: LEFT HEART CATH AND CORS/GRAFTS ANGIOGRAPHY;  Surgeon: [place], [name], MD;  Location: [facility] INVASIVE CV LAB;  Service: Cardiovascular;  Laterality: N/A;   LEFT HEART CATH AND CORS/GRAFTS ANGIOGRAPHY N/A 08/20/2022   Procedure: LEFT HEART CATH AND CORS/GRAFTS ANGIOGRAPHY;  Surgeon: Kathleene Hazel, MD;  Location: [facility];  Service: Cardiovascular;  Laterality: N/A;   LEFT HEART CATHETERIZATION WITH CORONARY ANGIOGRAM N/A 07/20/2012   Procedure: LEFT HEART CATHETERIZATION WITH CORONARY ANGIOGRAM;  Surgeon: Iran Ouch, MD;  Location: [facility];  Service: Cardiovascular;  Laterality: N/A;   LEFT HEART CATHETERIZATION WITH CORONARY ANGIOGRAM N/A 11/20/2014   Procedure: LEFT HEART CATHETERIZATION WITH CORONARY ANGIOGRAM;  Surgeon: [name] [place], MD;  Location: [facility] CATH LAB;  Service: Cardiovascular;  Laterality: N/A;   LEFT HEART CATHETERIZATION WITH CORONARY ANGIOGRAM N/A 11/26/2014   Procedure: LEFT HEART CATHETERIZATION WITH CORONARY ANGIOGRAM;  Surgeon: [name] [place], MD;  Location: [facility] CATH LAB;  Service: Cardiovascular;  Laterality: N/A;   NEUROPLASTY / TRANSPOSITION ULNAR NERVE AT ELBOW Right ~ 2012   PERCUTANEOUS CORONARY ROTOBLATOR INTERVENTION (PCI-R)  11/20/2014   Procedure: PERCUTANEOUS CORONARY ROTOBLATOR INTERVENTION (PCI-R);  Surgeon: [name] [place], MD;  Location: [facility] CATH LAB;  Service: Cardiovascular;;   POSTERIOR CERVICAL LAMINECTOMY Left 04/16/2022   Procedure: Laminectomy and Foraminotomy - left - C6-C7;  Surgeon: Julio Sicks, MD;  Location: [facility] OR;  Service: Neurosurgery;  Laterality: Left;  3C   SHOULDER ARTHROSCOPY Left ~ 2011   TEE WITHOUT CARDIOVERSION N/A 01/09/2016   Procedure: TRANSESOPHAGEAL ECHOCARDIOGRAM (TEE);  Surgeon: Purcell Nails, MD;  Location: [facility] OR;  Service: Open Heart Surgery;  Laterality: N/A;   Social History   Tobacco Use   Smoking status: Never    Passive exposure: Current   Smokeless tobacco:  Never  Vaping Use   Vaping status: Never Used  Substance Use Topics   Alcohol use: No   Drug use: No   Family History  Problem Relation Age of Onset   Stroke Mother    Coronary artery disease Father 47   Asthma Sister    Multiple sclerosis Brother    Heart disease Paternal Uncle    Stroke Maternal Grandmother    Heart attack Paternal Grandmother    Cancer Paternal Grandfather    Asthma Sister    Seizures Son    Migraines Son    Autism Son    Migraines Son    Allergies  Allergen Reactions   Divalproex Sodium Other (See Comments)    Causes anger   Statins Other (See Comments)    Muscle aches and cramps   Tramadol Other (See Comments)    Chest pain    Valproic Acid Other (See Comments)    Causes anger   Gadolinium Derivatives Nausea And Vomiting    07/25/19 Pt vomited immediately after IV gad. Denies itching, dyspnea.  (Adverse, not  allergic, reaction   Tricor [Fenofibrate] Other (See Comments)    Leg cramps   Review of Systems  Constitutional:  Negative for chills and fever.  HENT:  Negative for sore throat.   Respiratory:  Negative for cough and shortness of breath.   Cardiovascular:  Negative for chest pain, palpitations and leg swelling.  Gastrointestinal:  Negative for abdominal pain, blood in stool, constipation, diarrhea, nausea and vomiting.  Genitourinary:  Negative for dysuria and hematuria.  Musculoskeletal:  Negative for myalgias.  Skin:  Negative for itching and rash.  Neurological:  Negative for dizziness and headaches.  Psychiatric/Behavioral:  Negative for depression and suicidal ideas.      Objective:     BP 119/69 (BP Location: Left Arm, Patient Position: Sitting, Cuff Size: Large)   Pulse 63   Ht 5\' 9"  (1.753 m)   Wt 221 lb 6.4 oz (100.4 kg)   SpO2 96%   BMI 32.70 kg/m  BP Readings from Last 3 Encounters:  02/15/24 119/69  02/02/24 (!) 156/77  12/10/23 134/76   Physical Exam Vitals reviewed.  Constitutional:      General: He is not  in acute distress.    Appearance: Normal appearance. He is not ill-appearing.  HENT:     Head: Normocephalic and atraumatic.     Right Ear: External ear normal.     Left Ear: External ear normal.     Nose: Nose normal. No congestion or rhinorrhea.     Mouth/Throat:     Mouth: Mucous membranes are moist.     Pharynx: Oropharynx is clear.  Eyes:     General: No scleral icterus.    Extraocular Movements: Extraocular movements intact.     Conjunctiva/sclera: Conjunctivae normal.     Pupils: Pupils are equal, round, and reactive to light.  Cardiovascular:     Rate and Rhythm: Normal rate and regular rhythm.     Pulses: Normal pulses.     Heart sounds: Murmur heard.  Pulmonary:     Effort: Pulmonary effort is normal.     Breath sounds: Normal breath sounds. No wheezing, rhonchi or rales.  Abdominal:     General: Abdomen is flat. Bowel sounds are normal. There is no distension.     Palpations: Abdomen is soft.     Tenderness: There is no abdominal tenderness.  Musculoskeletal:        General: No swelling or deformity. Normal range of motion.     Cervical back: Normal range of motion.  Skin:    General: Skin is warm and dry.     Capillary Refill: Capillary refill takes less than 2 seconds.  Neurological:     General: No focal deficit present.     Mental Status: He is alert and oriented to person, place, and time.     Motor: No weakness.  Psychiatric:        Mood and Affect: Mood normal.        Behavior: Behavior normal.        Thought Content: Thought content normal.   Last CBC Lab Results  Component Value Date   WBC 2.7 (L) 01/24/2024   HGB 15.7 01/24/2024   HCT 44.7 01/24/2024   MCV 90.7 01/24/2024   MCH 31.8 01/24/2024   RDW 13.0 01/24/2024   PLT 73 (L) 01/24/2024   Last metabolic panel Lab Results  Component Value Date   GLUCOSE 120 (H) 10/17/2023   NA 139 10/17/2023   K 4.1 10/17/2023   CL 107 10/17/2023  CO2 25 10/17/2023   BUN 16 10/17/2023   CREATININE  1.00 10/17/2023   GFRNONAA >60 10/17/2023   CALCIUM 9.2 10/17/2023   PROT 6.5 10/17/2023   ALBUMIN 3.4 (L) 10/17/2023   LABGLOB 3.5 09/29/2022   AGRATIO 1.1 (L) 09/29/2022   BILITOT 0.7 10/17/2023   ALKPHOS 42 10/17/2023   AST 28 10/17/2023   ALT 25 10/17/2023   ANIONGAP 7 10/17/2023   Last lipids Lab Results  Component Value Date   CHOL 87 02/02/2023   HDL 32 (L) 02/02/2023   LDLCALC 36 02/02/2023   TRIG 96 02/02/2023   CHOLHDL 2.7 02/02/2023   Last hemoglobin A1c Lab Results  Component Value Date   HGBA1C 5.8 (H) 08/17/2023   Last thyroid functions Lab Results  Component Value Date   TSH 1.077 01/27/2023   Last vitamin D Lab Results  Component Value Date   VD25OH 25.8 (L) 09/29/2022   Last vitamin B12 and Folate Lab Results  Component Value Date   VITAMINB12 250 01/24/2024   FOLATE 13.3 01/24/2024     Assessment & Plan:   Problem List Items Addressed This Visit       Essential hypertension, benign - Primary   Remains adequately controlled on current antihypertensive regimen.  No medication changes are indicated today.      Left leg DVT (HCC)   History of unprovoked DVT/PE.  Recently seen by oncology for follow-up.  He remains on Eliquis 5 mg twice daily.      Obstructive sleep apnea   He endorses nightly compliance with CPAP in the setting of severe OSA.  Due for pulmonology follow-up.  Will assist with scheduling today.      Type 2 diabetes mellitus (HCC)   A1c 5.8 on labs from October 2024.  He is currently prescribed [name] 5 mg weekly and Farxiga 10 mg daily.  Currently off [name] x 2 weeks.  Refill provided today.  Repeat A1c and urine microalbumin/creatinine ratio ordered.      Asymmetrical sensorineural hearing loss   Audiological evaluation completed yesterday (4/7).  ENT referral recommended and was placed.      Mixed hyperlipidemia   He is currently prescribed pravastatin 20 mg daily and ezetimibe 10 mg daily.  Lipid panel last  updated in March 2024 reflects adequate control on this regimen.  Repeat lipid panel ordered today.      Return in about 6 months (around 08/16/2024).   [name], MD

## 2024-02-15 NOTE — Patient Instructions (Signed)
 It was a pleasure to see you today.  Thank you for giving [place] the opportunity to be involved in your care.  Below is a brief recap of your visit and next steps.  We will plan to see you again in 6 months.  Summary No medication changes today [name] refilled Repeat labs Follow up in 6 months

## 2024-02-15 NOTE — Assessment & Plan Note (Signed)
 Remains adequately controlled on current antihypertensive regimen.  No medication changes are indicated today.

## 2024-02-15 NOTE — Assessment & Plan Note (Signed)
 Audiological evaluation completed yesterday (4/7).  ENT referral recommended and was placed.

## 2024-02-15 NOTE — Assessment & Plan Note (Signed)
 History of unprovoked DVT/PE.  Recently seen by oncology for follow-up.  He remains on Eliquis 5 mg twice daily.

## 2024-02-16 ENCOUNTER — Encounter: Payer: Self-pay | Admitting: Internal Medicine

## 2024-02-16 ENCOUNTER — Other Ambulatory Visit: Payer: Self-pay | Admitting: Internal Medicine

## 2024-02-16 LAB — CBC WITH DIFFERENTIAL/PLATELET

## 2024-02-18 ENCOUNTER — Other Ambulatory Visit: Payer: Self-pay | Admitting: Internal Medicine

## 2024-02-18 LAB — CBC WITH DIFFERENTIAL/PLATELET
Basos: 1 %
EOS (ABSOLUTE): 0 10*3/uL (ref 0.0–0.2)
Eos: 2 %
Hematocrit: 41.5 % (ref 37.5–51.0)
Hemoglobin: 14.3 g/dL (ref 13.0–17.7)
Immature Granulocytes: 0 10*3/uL (ref 0.0–0.1)
Immature Granulocytes: 1 %
Lymphs: 29 %
MCH: 31.8 pg (ref 26.6–33.0)
MCHC: 34.5 g/dL (ref 31.5–35.7)
MCV: 92 fL (ref 79–97)
Monocytes Absolute: 0.1 10*3/uL (ref 0.0–0.4)
Monocytes Absolute: 0.2 10*3/uL (ref 0.1–0.9)
Monocytes: 7 %
Neutrophils Absolute: 0.8 10*3/uL (ref 0.7–3.1)
Neutrophils Absolute: 1.8 10*3/uL (ref 1.4–7.0)
Neutrophils: 60 %
Platelets: 68 10*3/uL — CL (ref 150–450)
RBC: 4.49 x10E6/uL (ref 4.14–5.80)
RDW: 13.2 % (ref 11.6–15.4)
WBC: 2.9 10*3/uL — ABNORMAL LOW (ref 3.4–10.8)

## 2024-02-18 LAB — TSH+FREE T4
Free T4: 1 ng/dL (ref 0.82–1.77)
TSH: 2.38 u[IU]/mL (ref 0.450–4.500)

## 2024-02-18 LAB — CMP14+EGFR
ALT: 21 IU/L (ref 0–44)
AST: 26 IU/L (ref 0–40)
Albumin: 3.9 g/dL (ref 3.9–4.9)
Alkaline Phosphatase: 59 IU/L (ref 44–121)
BUN/Creatinine Ratio: 14 (ref 10–24)
BUN: 16 mg/dL (ref 8–27)
Bilirubin Total: 0.5 mg/dL (ref 0.0–1.2)
CO2: 22 mmol/L (ref 20–29)
Calcium: 9.1 mg/dL (ref 8.6–10.2)
Chloride: 104 mmol/L (ref 96–106)
Creatinine, Ser: 1.18 mg/dL (ref 0.76–1.27)
Globulin, Total: 2.8 g/dL (ref 1.5–4.5)
Glucose: 235 mg/dL — ABNORMAL HIGH (ref 70–99)
Potassium: 4.3 mmol/L (ref 3.5–5.2)
Sodium: 139 mmol/L (ref 134–144)
Total Protein: 6.7 g/dL (ref 6.0–8.5)
eGFR: 68 mL/min/{1.73_m2} (ref >59)

## 2024-02-18 LAB — LIPID PANEL
Chol/HDL Ratio: 4.2 ratio (ref 0.0–5.0)
Cholesterol, Total: 154 mg/dL (ref 100–199)
HDL: 37 mg/dL — ABNORMAL LOW (ref >39)
LDL Chol Calc (NIH): 91 mg/dL (ref 0–99)
Triglycerides: 145 mg/dL (ref 0–149)
VLDL Cholesterol Cal: 26 mg/dL (ref 5–40)

## 2024-02-18 LAB — B12 AND FOLATE PANEL
Folate: 14.1 ng/mL (ref >3.0)
Vitamin B-12: 319 pg/mL (ref 232–1245)

## 2024-02-18 LAB — MICROALBUMIN / CREATININE URINE RATIO
Creatinine, Urine: 154.4 mg/dL
Microalb/Creat Ratio: 206 mg/g{creat} — ABNORMAL HIGH (ref 0–29)
Microalbumin, Urine: 317.6 ug/mL

## 2024-02-18 LAB — VITAMIN D 25 HYDROXY (VIT D DEFICIENCY, FRACTURES): Vit D, 25-Hydroxy: 22 ng/mL — ABNORMAL LOW (ref 30.0–100.0)

## 2024-02-18 LAB — HEMOGLOBIN A1C
Est. average glucose Bld gHb Est-mCnc: 171 mg/dL
Hgb A1c MFr Bld: 7.6 % — ABNORMAL HIGH (ref 4.8–5.6)

## 2024-03-07 ENCOUNTER — Encounter (HOSPITAL_COMMUNITY): Payer: Self-pay | Admitting: Hematology

## 2024-03-07 ENCOUNTER — Encounter (INDEPENDENT_AMBULATORY_CARE_PROVIDER_SITE_OTHER): Payer: Self-pay | Admitting: Otolaryngology

## 2024-03-07 ENCOUNTER — Encounter (INDEPENDENT_AMBULATORY_CARE_PROVIDER_SITE_OTHER): Payer: Self-pay | Admitting: Gastroenterology

## 2024-03-07 ENCOUNTER — Ambulatory Visit (INDEPENDENT_AMBULATORY_CARE_PROVIDER_SITE_OTHER): Payer: 59 | Admitting: Gastroenterology

## 2024-03-07 VITALS — BP 105/66 | HR 63 | Temp 97.7°F | Ht 69.0 in | Wt 208.8 lb

## 2024-03-07 DIAGNOSIS — K7460 Unspecified cirrhosis of liver: Secondary | ICD-10-CM

## 2024-03-07 DIAGNOSIS — R143 Flatulence: Secondary | ICD-10-CM | POA: Diagnosis not present

## 2024-03-07 DIAGNOSIS — K219 Gastro-esophageal reflux disease without esophagitis: Secondary | ICD-10-CM

## 2024-03-07 NOTE — Patient Instructions (Signed)
-  continue coreg  6.25mg  twice daily -we will update labs and US  in regards to your liver  -continue omeprazole  40mg  daily -Reduce salt intake to <2 g per day - Can take Tylenol  max of 2 g per day (650 mg q8h) for pain - Avoid NSAIDs for pain - Avoid eating raw oysters/shellfish - Ensure every night before going to sleep -you can try over the counter phazyme or gas x for gas  Follow up 6 months  It was a pleasure to see you today. I want to create trusting relationships with patients and provide genuine, compassionate, and quality care. I truly value your feedback! please be on the lookout for a survey regarding your visit with me today. I appreciate your input about our visit and your time in completing this!    [name] Crossley L. Nikholas Geffre, MSN, APRN, AGNP-C Adult-Gerontology Nurse Practitioner [facility] Gastro[facility]ology at [facility]

## 2024-03-07 NOTE — Progress Notes (Signed)
 Referring Provider: Tobi Fortes, MD Primary Care Physician:  Tobi Fortes, MD Primary GI Physician: Dr. Sammi Crick   Chief Complaint  Patient presents with   Cirrhosis    Follow up on cirrhosis. States doing ok besides having a lot of gas. Taking omeprazole  daily.    HPI:   [name] [name] is a 68 y.o. male with past medical history of  myocardial infarction status post CABG, anxiety, asthma, Florentina Huntsman cirrhosis, hyperlipidemia, OSA, and type 2 diabetes   Patient presenting today for:  Follow up of cirrhosis, flatus/gas, GERD/dysphagia   Last seen October 2024, at that time doing well, taking benefiber PRN. Having BM 2-3 times per day. Some bloating at times. Some dysphagia with solid foods.   Recommended RUQ US , MELD labs, AFP, CBC, continue coreg  6.25mg  BID  US  and MELD labs not completed  Present: Feels dysphagia is improved, no real issues at this time. GERD well managed on omeprazole  40mg  daily.   Moving bowels well without issue. No abdominal pain. Continues to have some gas, he is not taking anything for this. No rectal bleeding or melena. Denies any confusion, jaundice pruritus. No swelling to his abdomen or legs.   Previous MELD 3.0: August 2024 --9   Cirrhosis related questions: Episodes of confusion/disorientation: none Taking diuretics? No  Beta blockers? Coreg  6.25mg  BID  Prior history of variceal banding? No  Prior episodes of SBP? No  Last liver imaging: 06/2023 Findings consistent with cirrhosis. No focal liver lesion identified. Last AFP: July 2024 2.1 Alcohol use: none    Last Colonoscopy:06/2023- The entire examined colon is normal.                           - Non-bleeding internal hemorrhoids.                           - No specimens collected. Last Endoscopy:- 06/2023 Grade II esophageal varices.                           - Non-obstructing Schatzki ring.                           - Normal stomach.                           - Normal examined  duodenum.                           - No specimens collected.   Recommendations:  Repeat colonoscopy 10 years  Filed Weights   03/07/24 0920  Weight: 208 lb 12.8 oz (94.7 kg)     Past Medical History:  Diagnosis Date   Anxiety    Aortic insufficiency    Arthritis    Asthma    Cataract    Right eye   Chronic lower back pain    Cirrhosis of liver (HCC)    Coronary atherosclerosis of native coronary artery    a. s/p multiple prior stents, s/p CABG in 01/2016 with LIMA-LAD, Free RIMA-OM2 and SVG-PDA   Essential hypertension    Fatty liver    GERD (gastroesophageal reflux disease)    Hearing loss of left ear    History of gout    History of hiatal hernia  Hypercholesteremia    Iron  deficiency 08/27/2021   Lumbar herniated disc    MI, old 2017   Migraine    OSA (obstructive sleep apnea)    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   LIMA to LAD, free RIMA to OM2, SVG to PDA, open SVG harvest from right thigh   Stroke (HCC)    Thrombocytopenia (HCC)    TIA (transient ischemic attack)    Type 2 diabetes mellitus (HCC)     Past Surgical History:  Procedure Laterality Date   ANTERIOR CERVICAL DECOMP/DISCECTOMY FUSION  1998   "C3-4"   CARDIAC CATHETERIZATION  "several"   CARDIAC CATHETERIZATION N/A 12/30/2015   Procedure: Left Heart Cath and Coronary Angiography;  Surgeon: Avanell Leigh, MD;  Location: [facility];  Service: Cardiovascular;  Laterality: N/A;   CARPAL TUNNEL RELEASE Bilateral 2005   CHOLECYSTECTOMY N/A 11/28/2018   Procedure: LAPAROSCOPIC CHOLECYSTECTOMY;  Surgeon: Awilda Bogus, MD;  Location: [facility];  Service: General;  Laterality: N/A;   COLONOSCOPY     COLONOSCOPY WITH PROPOFOL  N/A 09/27/2020   Procedure: COLONOSCOPY WITH PROPOFOL ;  Surgeon: Urban Garden, MD;  Location: [facility];  Service: Gastroenterology;  Laterality: N/A;   COLONOSCOPY WITH PROPOFOL  N/A 06/29/2023   Procedure: COLONOSCOPY WITH PROPOFOL ;  Surgeon: Urban Garden, MD;  Location: [facility];  Service: Gastroenterology;  Laterality: N/A;  1:30PM;ASA 3   CORONARY ANGIOPLASTY     CORONARY ANGIOPLASTY WITH STENT PLACEMENT  2002; 2003; 11/20/2014   "I have 4 stents after today" (11/20/2014)   CORONARY ARTERY BYPASS GRAFT N/A 01/09/2016   Procedure: CORONARY ARTERY BYPASS GRAFTING (CABG) X 3 UTILIZING RIGHT AND LEFT INTERNAL MAMMARY ARTERY AND ENDOSCOPICALLY HARVESTED SAPHENEOUS VEIN.;  Surgeon: Gardenia Jump, MD;  Location: [facility];  Service: Open Heart Surgery;  Laterality: N/A;   ESOPHAGOGASTRODUODENOSCOPY     ESOPHAGOGASTRODUODENOSCOPY (EGD) WITH PROPOFOL  N/A 09/27/2020   Procedure: ESOPHAGOGASTRODUODENOSCOPY (EGD) WITH PROPOFOL ;  Surgeon: Urban Garden, MD;  [facility]: [facility];  Service: Gastroenterology;  Laterality: N/A;  10   ESOPHAGOGASTRODUODENOSCOPY (EGD) WITH PROPOFOL  N/A 06/29/2023   Procedure: ESOPHAGOGASTRODUODENOSCOPY (EGD) WITH PROPOFOL ;  Surgeon: Urban Garden, MD;  [facility]: [facility];  Service: Gastroenterology;  Laterality: N/A;  1:30PM;ASA 3   KNEE SURGERY Left 02/2012   "scraped; open"   LEFT HEART CATH AND CORS/GRAFTS ANGIOGRAPHY N/A 08/09/2017   Procedure: LEFT HEART CATH AND CORS/GRAFTS ANGIOGRAPHY;  Surgeon: Sammy Crisp, MD;  [facility];  Service: Cardiovascular;  Laterality: N/A;   LEFT HEART CATH AND CORS/GRAFTS ANGIOGRAPHY N/A 11/22/2018   Procedure: LEFT HEART CATH AND CORS/GRAFTS ANGIOGRAPHY;  Surgeon: Swaziland, [name], MD;  Location: [facility] INVASIVE CV LAB;  Service: Cardiovascular;  Laterality: N/A;   LEFT HEART CATH AND CORS/GRAFTS ANGIOGRAPHY N/A 08/20/2022   Procedure: LEFT HEART CATH AND CORS/GRAFTS ANGIOGRAPHY;  Surgeon: Odie Benne, MD;  [facility]: [facility];  Service: Cardiovascular;  Laterality: N/A;   LEFT HEART CATHETERIZATION WITH CORONARY ANGIOGRAM N/A 07/20/2012   Procedure: LEFT HEART CATHETERIZATION WITH CORONARY ANGIOGRAM;  Surgeon: Wenona Hamilton, MD;  [facility]: [facility];  Service: Cardiovascular;  Laterality: N/A;   LEFT HEART CATHETERIZATION WITH CORONARY ANGIOGRAM N/A 11/20/2014   Procedure: LEFT HEART CATHETERIZATION WITH CORONARY ANGIOGRAM;  Surgeon: [name] Swaziland, MD;  Location: [facility] CATH LAB;  Service: Cardiovascular;  Laterality: N/A;   LEFT HEART CATHETERIZATION WITH CORONARY ANGIOGRAM N/A 11/26/2014   Procedure: LEFT HEART CATHETERIZATION WITH CORONARY  [employer];  Surgeon: [name] Swaziland, MD;  Location: [facility] CATH LAB;  Service: Cardiovascular;  Laterality: N/A;   NEUROPLASTY / TRANSPOSITION ULNAR NERVE AT ELBOW Right ~ 2012   PERCUTANEOUS CORONARY ROTOBLATOR INTERVENTION (PCI-R)  11/20/2014   Procedure: PERCUTANEOUS CORONARY ROTOBLATOR INTERVENTION (PCI-R);  Surgeon: [name] Swaziland, MD;  Location: [facility] CATH LAB;  Service: Cardiovascular;;   POSTERIOR CERVICAL LAMINECTOMY Left 04/16/2022   Procedure: Laminectomy and Foraminotomy - left - C6-C7;  Surgeon: Agustina Aldrich, MD;  Location: [facility];  Service: Neurosurgery;  Laterality: Left;  3C   SHOULDER ARTHROSCOPY Left ~ 2011   TEE WITHOUT CARDIOVERSION N/A 01/09/2016   Procedure: TRANSESOPHAGEAL ECHOCARDIOGRAM (TEE);  Surgeon: Gardenia Jump, MD;  Location: [facility] OR;  Service: Open Heart Surgery;  Laterality: N/A;    Current Outpatient Medications  Medication Sig Dispense Refill   albuterol  (VENTOLIN  HFA) 108 (90 Base) MCG/ACT inhaler Inhale 2 puffs into the lungs every 6 (six) hours as needed for wheezing or shortness of breath.     carvedilol  (COREG ) 6.25 MG tablet Take 1 tablet (6.25 mg total) by mouth 2 (two) times daily with a meal. 180 tablet 3   citalopram  (CELEXA ) 20 MG tablet TAKE 1 TABLET BY MOUTH AT BEDTIME. 30 tablet 10   Continuous Glucose Receiver (FREESTYLE LIBRE 2 READER) DEVI 1 Device by Does not apply route continuous. 1 each 0   Continuous Glucose Sensor (FREESTYLE LIBRE 2 SENSOR) MISC USE 1 EACH FOR 14 DAYS 2 each 10   cyclobenzaprine  (FLEXERIL ) 10 MG tablet TAKE 1  TABLET BY MOUTH 3 TIMES DAILY AS NEEDED FOR MUSCLE SPASMS 30 tablet 10   ELIQUIS  5 MG TABS tablet TAKE 1 TABLET BY MOUTH TWICE DAILY 60 tablet 10   ezetimibe  (ZETIA ) 10 MG tablet TAKE 1 TABLET BY MOUTH ONCE DAILY 30 tablet 10   FARXIGA  5 MG TABS tablet TAKE 1 TABLET BY MOUTH DAILY BEFORE BREAKFAST 30 tablet 10   fluticasone  (FLONASE ) 50 MCG/ACT nasal spray PLACE 2 SPRAYS INTO BOTH NOSTRILS 2 TIMES DAILY. 16 g 11   fluticasone -salmeterol (ADVAIR ) 250-50 MCG/ACT AEPB Inhale 1 puff into the lungs 2 (two) times daily. 60 each 10   gabapentin  (NEURONTIN ) 300 MG capsule TAKE 1 CAPSULE BY MOUTH THREE TIMES DAILY 90 capsule 10   isosorbide  mononitrate (IMDUR ) 60 MG 24 hr tablet TAKE 1 TABLET (60MG ) BY MOUTH IN THE MORNING AND 1/2 TABLET (30MG ) IN THE EVENING 45 tablet 10   Melatonin 10 MG TABS Take 10 mg by mouth at bedtime.     nitroGLYCERIN  (NITROSTAT ) 0.4 MG SL tablet DISSOLVE 1 TABLET UNDER THE TONGUE AS NEEDED FOR CHEST PAIN EVERY 5 MINUTES UP TO 3 TIMES. IF NO RELIEF CALL 911. 25 tablet 10   omeprazole  (PRILOSEC) 40 MG capsule Take 1 capsule (40 mg total) by mouth daily. 30 capsule 10   pravastatin  (PRAVACHOL ) 20 MG tablet TAKE 1 TABLET BY MOUTH DAILY. 30 tablet 10   tirzepatide  ([name] ) 5 MG/0.5ML Pen Inject 5 mg into the skin once a week. 2 mL 10   topiramate  (TOPAMAX ) 50 MG tablet TAKE 1 TABLET BY MOUTH TWICE DAILY 180 tablet 11   zonisamide  (ZONEGRAN ) 100 MG capsule Take 100 mg by mouth daily.     No current facility-administered medications for this visit.    Allergies as of 03/07/2024 - Review Complete 02/15/2024  Allergen Reaction Noted   Divalproex sodium Other (See Comments) 03/07/2018   Statins Other (See Comments) 12/03/2014   Tramadol  Other (See Comments) 03/29/2017  Valproic acid Other (See Comments) 09/14/2016   Gadolinium derivatives Nausea And Vomiting 07/25/2019   Tricor  [fenofibrate ] Other (See Comments) 01/08/2015    Social History   Socioeconomic History   Marital  status: Married    Spouse name: [name]   Number of children: 3   Years of education: 12 th    Highest education level: Not on file  Occupational History   Occupation: Unemployed  Tobacco Use   Smoking status: Never    Passive exposure: Current   Smokeless tobacco: Never  Vaping Use   Vaping status: Never Used  Substance and Sexual Activity   Alcohol use: No   Drug use: No   Sexual activity: Not on file  Other Topics Concern   Not on file  Social History Narrative   Patient lives at home with wife [name].   Patient works at [employer], Social research officer, government.   Patient has a 12 th grade education.    Patient has 3 children.       Updated 11/30/2023:   Works at [employer] as a Engineer, materials      Social Drivers of Corporate investment banker Strain: Low Risk  (11/30/2023)   Overall Financial Resource Strain (CARDIA)    Difficulty of Paying Living Expenses: Not hard at all  Food Insecurity: No Food Insecurity (11/30/2023)   Hunger Vital Sign    Worried About Running Out of Food in the Last Year: Never true    Ran Out of Food in the Last Year: Never true  Transportation Needs: No Transportation Needs (11/30/2023)   PRAPARE - Administrator, Civil Service (Medical): No    Lack of Transportation (Non-Medical): No  Physical Activity: Sufficiently Active (11/30/2023)   Exercise Vital Sign    Days of Exercise per Week: 3 days    Minutes of Exercise per Session: 60 min  Stress: No Stress Concern Present (11/30/2023)   [employer] of Occupational Health - Occupational Stress Questionnaire    Feeling of Stress : Not at all  Social Connections: Socially Integrated (11/30/2023)   Social Connection and Isolation Panel [NHANES]    Frequency of Communication with Friends and Family: More than three times a week    Frequency of Social Gatherings with Friends and Family: More than three times a week    Attends Religious Services: More than 4 times per year    Active  Member of [employer] or Organizations: Yes    Attends Engineer, structural: More than 4 times per year    Marital Status: Married    Review of systems General: negative for malaise, night sweats, fever, chills, weight loss Neck: Negative for lumps, goiter, pain and significant neck swelling Resp: Negative for cough, wheezing, dyspnea at rest CV: Negative for chest pain, leg swelling, palpitations, orthopnea GI: denies melena, hematochezia, nausea, vomiting, diarrhea, constipation, dysphagia, odyonophagia, early satiety or unintentional weight loss. +flatus  MSK: Negative for joint pain or swelling, back pain, and muscle pain. Derm: Negative for itching or rash Psych: Denies depression, anxiety, memory loss, confusion. No homicidal or suicidal ideation.  Heme: Negative for prolonged bleeding, bruising easily, and swollen nodes. The remainder of the review of systems is noncontributory.  Physical Exam: BP 105/66   Pulse 63   Temp 97.7 F (36.5 C) (Oral)   Ht 5\' 9"  (1.753 m)   Wt 208 lb 12.8 oz (94.7 kg)   BMI 30.83 kg/m  General:   Alert and oriented. No distress noted.  Pleasant and cooperative.  Head:  Normocephalic and atraumatic. Eyes:  Conjuctiva clear without scleral icterus. Mouth:  Oral mucosa pink and moist. Good dentition. No lesions. Heart: Normal rate and rhythm, s1 and s2 heart sounds present.  Lungs: Clear lung sounds in all lobes. Respirations equal and unlabored. Abdomen:  +BS, soft, non-tender and non-distended. No rebound or guarding. No HSM or masses noted. Derm: No palmar erythema or jaundice Msk:  Symmetrical without gross deformities. Normal posture. Extremities:  Without edema. Neurologic:  Alert and  oriented x4 Psych:  Alert and cooperative. Normal mood and affect.  Invalid input(s): "6 MONTHS"   ASSESSMENT: [name] [name] is a 68 y.o. male presenting today for follow up of cirrhosis, flatus/gas, GERD/dysphagia  Cirrhosis:has remained well  compensated, last MELD 3.0 in August was 9. He is up-to-date on EV screening via EGD with presence of grade 2 esophageal varices, on Coreg  6.25 mg BID, Heart rate today was 63 not quite at goal however BP was 105/66 therefore I am hesitant to increase Coreg  any further as I do not want to drop his blood pressure too low. For now we will continue with Coreg  6.25 mg twice daily. He is overdue for [facility] screening, AFP and INR.  As patient is on [name] , he should try and maintain intake of 1-2 protein shakes per day to avoid muscle wasting in setting of weight loss.   GERD/Dysphagia: no issues with dysphagia currently, GERD well managed on omeprazole  40mg  daily. Will continue with current regimen  He notes some flatus/gas. Recommended he try otc phazyme/ gas x. He can let me know if this is not improving.    PLAN:  -continue coreg  6.25mg  BID -RUQ US  for HCC screening -AFP and INR -continue omeprazole  40mg  daily -Reduce salt intake to <2 g per day - Can take Tylenol  max of 2 g per day (650 mg q8h) for pain - Avoid NSAIDs for pain - Avoid eating raw oysters/shellfish - Ensure every night before going to sleep -can try otc phazyme or gas x  All questions were answered, patient verbalized understanding and is in agreement with plan as outlined above.    Follow Up: 6 months   Dontrelle Mazon L. Adrien Alberta, MSN, APRN, AGNP-C Adult-Gerontology Nurse Practitioner [facility]  I have reviewed the note and agree with the APP's assessment as described in this progress note  Samantha Cress, MD Gastroenterology and Hepatology [facility]

## 2024-03-09 ENCOUNTER — Encounter (INDEPENDENT_AMBULATORY_CARE_PROVIDER_SITE_OTHER): Payer: Self-pay

## 2024-03-09 LAB — AFP TUMOR MARKER: AFP-Tumor Marker: 2.8 ng/mL (ref <6.1)

## 2024-03-09 LAB — PROTIME-INR
INR: 1.2 — ABNORMAL HIGH
Prothrombin Time: 12.3 s — ABNORMAL HIGH (ref 9.0–11.5)

## 2024-03-20 ENCOUNTER — Ambulatory Visit (HOSPITAL_COMMUNITY): Attending: Gastroenterology

## 2024-03-31 ENCOUNTER — Emergency Department (HOSPITAL_COMMUNITY)

## 2024-03-31 ENCOUNTER — Emergency Department (HOSPITAL_COMMUNITY)
Admission: EM | Admit: 2024-03-31 | Discharge: 2024-03-31 | Disposition: A | Discharge status: Home / Self Care | Attending: Emergency Medicine | Admitting: Emergency Medicine

## 2024-03-31 ENCOUNTER — Other Ambulatory Visit: Payer: Self-pay

## 2024-03-31 ENCOUNTER — Encounter (HOSPITAL_COMMUNITY): Payer: Self-pay

## 2024-03-31 DIAGNOSIS — Z7901 Long term (current) use of anticoagulants: Secondary | ICD-10-CM | POA: Diagnosis not present

## 2024-03-31 DIAGNOSIS — R29818 Other symptoms and signs involving the nervous system: Secondary | ICD-10-CM | POA: Diagnosis not present

## 2024-03-31 DIAGNOSIS — R42 Dizziness and giddiness: Secondary | ICD-10-CM | POA: Diagnosis not present

## 2024-03-31 DIAGNOSIS — I6503 Occlusion and stenosis of bilateral vertebral arteries: Secondary | ICD-10-CM | POA: Diagnosis not present

## 2024-03-31 DIAGNOSIS — D72819 Decreased white blood cell count, unspecified: Secondary | ICD-10-CM | POA: Diagnosis not present

## 2024-03-31 DIAGNOSIS — M47812 Spondylosis without myelopathy or radiculopathy, cervical region: Secondary | ICD-10-CM | POA: Diagnosis not present

## 2024-03-31 DIAGNOSIS — G43009 Migraine without aura, not intractable, without status migrainosus: Secondary | ICD-10-CM | POA: Diagnosis not present

## 2024-03-31 DIAGNOSIS — R29810 Facial weakness: Secondary | ICD-10-CM | POA: Diagnosis not present

## 2024-03-31 DIAGNOSIS — Z79899 Other long term (current) drug therapy: Secondary | ICD-10-CM | POA: Diagnosis not present

## 2024-03-31 DIAGNOSIS — I6523 Occlusion and stenosis of bilateral carotid arteries: Secondary | ICD-10-CM | POA: Diagnosis not present

## 2024-03-31 DIAGNOSIS — R531 Weakness: Secondary | ICD-10-CM | POA: Diagnosis not present

## 2024-03-31 DIAGNOSIS — I672 Cerebral atherosclerosis: Secondary | ICD-10-CM | POA: Diagnosis not present

## 2024-03-31 LAB — COMPREHENSIVE METABOLIC PANEL WITH GFR
ALT: 29 U/L (ref 0–44)
AST: 30 U/L (ref 15–41)
Albumin: 3.2 g/dL — ABNORMAL LOW (ref 3.5–5.0)
Alkaline Phosphatase: 48 U/L (ref 38–126)
Anion gap: 5 (ref 5–15)
BUN: 21 mg/dL (ref 8–23)
CO2: 20 mmol/L — ABNORMAL LOW (ref 22–32)
Calcium: 7.9 mg/dL — ABNORMAL LOW (ref 8.9–10.3)
Chloride: 108 mmol/L (ref 98–111)
Creatinine, Ser: 1.27 mg/dL — ABNORMAL HIGH (ref 0.61–1.24)
GFR, Estimated: >60 mL/min (ref >60)
Glucose, Bld: 121 mg/dL — ABNORMAL HIGH (ref 70–99)
Potassium: 3.5 mmol/L (ref 3.5–5.1)
Sodium: 133 mmol/L — ABNORMAL LOW (ref 135–145)
Total Bilirubin: 0.9 mg/dL (ref 0.0–1.2)
Total Protein: 6.1 g/dL — ABNORMAL LOW (ref 6.5–8.1)

## 2024-03-31 LAB — APTT: aPTT: 42 s — ABNORMAL HIGH (ref 24–36)

## 2024-03-31 LAB — RAPID URINE DRUG SCREEN, HOSP PERFORMED
Amphetamines: NOT DETECTED
Barbiturates: NOT DETECTED
Benzodiazepines: NOT DETECTED
Cocaine: NOT DETECTED
Opiates: NOT DETECTED
Tetrahydrocannabinol: NOT DETECTED

## 2024-03-31 LAB — CBC
HCT: 39.3 % (ref 39.0–52.0)
Hemoglobin: 14.4 g/dL (ref 13.0–17.0)
MCH: 32.4 pg (ref 26.0–34.0)
MCHC: 36.6 g/dL — ABNORMAL HIGH (ref 30.0–36.0)
MCV: 88.5 fL (ref 80.0–100.0)
Platelets: 56 10*3/uL — ABNORMAL LOW (ref 150–400)
RBC: 4.44 MIL/uL (ref 4.22–5.81)
RDW: 13.7 % (ref 11.5–15.5)
WBC: 2.1 10*3/uL — ABNORMAL LOW (ref 4.0–10.5)
nRBC: 0 % (ref 0.0–0.2)

## 2024-03-31 LAB — DIFFERENTIAL
Abs Immature Granulocytes: 0.01 10*3/uL (ref 0.00–0.07)
Basophils Absolute: 0 10*3/uL (ref 0.0–0.1)
Basophils Relative: 0 %
Eosinophils Absolute: 0.1 10*3/uL (ref 0.0–0.5)
Eosinophils Relative: 2 %
Immature Granulocytes: 1 %
Lymphocytes Relative: 35 %
Lymphs Abs: 0.7 10*3/uL (ref 0.7–4.0)
Monocytes Absolute: 0.3 10*3/uL (ref 0.1–1.0)
Monocytes Relative: 14 %
Neutro Abs: 1 10*3/uL — ABNORMAL LOW (ref 1.7–7.7)
Neutrophils Relative %: 48 %

## 2024-03-31 LAB — PROTIME-INR
INR: 1.7 — ABNORMAL HIGH (ref 0.8–1.2)
Prothrombin Time: 20.2 s — ABNORMAL HIGH (ref 11.4–15.2)

## 2024-03-31 LAB — ETHANOL: Alcohol, Ethyl (B): <15 mg/dL (ref <15)

## 2024-03-31 LAB — TROPONIN I (HIGH SENSITIVITY): Troponin I (High Sensitivity): 3 ng/L (ref <18)

## 2024-03-31 MED ORDER — IOHEXOL 350 MG/ML SOLN
75.0000 mL | Freq: Once | INTRAVENOUS | Status: AC | PRN
  Administered 2024-03-31: 75 mL via INTRAVENOUS

## 2024-03-31 MED ORDER — LACTATED RINGERS IV BOLUS
1000.0000 mL | Freq: Once | INTRAVENOUS | Status: AC
  Administered 2024-03-31: 1000 mL via INTRAVENOUS

## 2024-03-31 MED ORDER — MECLIZINE HCL 12.5 MG PO TABS
25.0000 mg | ORAL_TABLET | Freq: Once | ORAL | Status: AC
  Administered 2024-03-31: 25 mg via ORAL
  Filled 2024-03-31: qty 2

## 2024-03-31 NOTE — ED Notes (Signed)
 Pt back from MRI @ 1822

## 2024-03-31 NOTE — ED Provider Notes (Signed)
 [place] EMERGENCY DEPARTMENT AT [facility] Provider Note   CSN: [id number] Arrival date & time: 03/31/24  1600     History  Chief Complaint  Patient presents with   Weakness    OSSIEL MARCHIO is a 68 y.o. male.  Patient is a 68 year old male who presents to the emergency department with a chief complaint of left-sided weakness, confusion, dizziness which started this morning approximately 5 AM.  Patient notes that he did have a fall last night striking the posterior aspect of his head and he is currently on Eliquis .  Patient notes that his symptoms have improved since initial onset but he does continue to have ongoing dizziness.  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  He denies any associated chest pain, shortness of breath, abdominal pain.  He does admit to an associated headache at this time.  There is been no associated syncope.   Weakness      Home Medications Prior to Admission medications   Medication Sig Start Date End Date Taking? Authorizing Provider  albuterol  (VENTOLIN  HFA) 108 (90 Base) MCG/ACT inhaler Inhale 2 puffs into the lungs every 6 (six) hours as needed for wheezing or shortness of breath.    [provider]  carvedilol  (COREG ) 6.25 MG tablet Take 1 tablet (6.25 mg total) by mouth 2 (two) times daily with a meal. 12/28/23   Tobi Fortes, MD  citalopram  (CELEXA ) 20 MG tablet TAKE 1 TABLET BY MOUTH AT BEDTIME. 09/20/23   Tobi Fortes, MD  Continuous Glucose Receiver (FREESTYLE LIBRE 2 READER) DEVI 1 Device by Does not apply route continuous. 12/28/23   Tobi Fortes, MD  Continuous Glucose Sensor (FREESTYLE LIBRE 2 SENSOR) MISC USE 1 EACH FOR 14 DAYS 12/28/23   Tobi Fortes, MD  cyclobenzaprine  (FLEXERIL ) 10 MG tablet TAKE 1 TABLET BY MOUTH 3 TIMES DAILY AS NEEDED FOR MUSCLE SPASMS 12/21/23   Tobi Fortes, MD  ELIQUIS  5 MG TABS tablet TAKE 1 TABLET BY MOUTH TWICE DAILY 01/19/24   Paulett Boros, MD  ezetimibe   (ZETIA ) 10 MG tablet TAKE 1 TABLET BY MOUTH ONCE DAILY 07/22/23   Dixon, Phillip E, MD  FARXIGA  5 MG TABS tablet TAKE 1 TABLET BY MOUTH DAILY BEFORE BREAKFAST 08/04/23   Dixon, Phillip E, MD  fluticasone  (FLONASE ) 50 MCG/ACT nasal spray PLACE 2 SPRAYS INTO BOTH NOSTRILS 2 TIMES DAILY. 02/17/24   Tobi Fortes, MD  fluticasone -salmeterol (ADVAIR ) 250-50 MCG/ACT AEPB Inhale 1 puff into the lungs 2 (two) times daily. 12/01/23   Tobi Fortes, MD  gabapentin  (NEURONTIN ) 300 MG capsule TAKE 1 CAPSULE BY MOUTH THREE TIMES DAILY 05/31/23   Dixon, Phillip E, MD  isosorbide  mononitrate (IMDUR ) 60 MG 24 hr tablet TAKE 1 TABLET (60MG ) BY MOUTH IN THE MORNING AND 1/2 TABLET (30MG ) IN THE EVENING 10/15/23   Tobi Fortes, MD  Melatonin 10 MG TABS Take 10 mg by mouth at bedtime.    [provider]  nitroGLYCERIN  (NITROSTAT ) 0.4 MG SL tablet DISSOLVE 1 TABLET UNDER THE TONGUE AS NEEDED FOR CHEST PAIN EVERY 5 MINUTES UP TO 3 TIMES. IF NO RELIEF CALL 911. 05/24/23   Tobi Fortes, MD  omeprazole  (PRILOSEC) 40 MG capsule Take 1 capsule (40 mg total) by mouth daily. 08/02/23   Tobi Fortes, MD  pravastatin  (PRAVACHOL ) 20 MG tablet TAKE 1 TABLET BY MOUTH DAILY. 08/20/23   Tobi Fortes, MD  tirzepatide  ([name] ) 5 MG/0.5ML Pen Inject 5  mg into the skin once a week. 02/15/24   Tobi Fortes, MD  topiramate  (TOPAMAX ) 50 MG tablet TAKE 1 TABLET BY MOUTH TWICE DAILY 02/21/24   Dixon, Phillip E, MD  zonisamide  (ZONEGRAN ) 100 MG capsule Take 100 mg by mouth daily. 01/19/23   [provider]      Allergies    Divalproex sodium, Statins, Tramadol , Valproic acid, Gadolinium derivatives, and Tricor  [fenofibrate ]    Review of Systems   Review of Systems  Neurological:  Positive for weakness.  All other systems reviewed and are negative.   Physical Exam Updated Vital Signs BP 104/60   Pulse 71   Temp 98.1 F (36.7 C) (Oral)   Resp 18   Ht 5\' 9"  (1.753 m)   Wt 92.1 kg   SpO2 97%   BMI  29.98 kg/m  Physical Exam Vitals and nursing note reviewed.  Constitutional:      General: He is not in acute distress.    Appearance: Normal appearance. He is not ill-appearing.  HENT:     Head: Normocephalic and atraumatic.     Nose: Nose normal.     Mouth/Throat:     Mouth: Mucous membranes are moist.  Eyes:     Extraocular Movements: Extraocular movements intact.     Conjunctiva/sclera: Conjunctivae normal.     Pupils: Pupils are equal, round, and reactive to light.  Cardiovascular:     Rate and Rhythm: Normal rate and regular rhythm.     Pulses: Normal pulses.     Heart sounds: Normal heart sounds. No murmur heard.    No gallop.  Pulmonary:     Effort: Pulmonary effort is normal. No respiratory distress.     Breath sounds: Normal breath sounds. No stridor. No wheezing, rhonchi or rales.  Abdominal:     General: Abdomen is flat. Bowel sounds are normal. There is no distension.     Palpations: Abdomen is soft.     Tenderness: There is no abdominal tenderness. There is no guarding.  Musculoskeletal:        General: No swelling, tenderness, deformity or signs of injury. Normal range of motion.     Cervical back: Normal range of motion and neck supple. No rigidity or tenderness.     Right lower leg: No edema.     Left lower leg: No edema.  Skin:    General: Skin is warm and dry.     Findings: No bruising or rash.  Neurological:     General: No focal deficit present.     Mental Status: He is alert and oriented to person, place, and time. Mental status is at baseline.     Cranial Nerves: No cranial nerve deficit.     Sensory: No sensory deficit.     Comments: Drift noted to left upper and lower extremity, strength 5 out of 5 in right upper and lower extremity, rotary nystagmus noted with left gaze, difficulty with finger-to-nose, heel-to-shin intact, normal rapid alternating movements, unstable gait  NIH scale: 2  Psychiatric:        Mood and Affect: Mood normal.         Behavior: Behavior normal.        Thought Content: Thought content normal.        Judgment: Judgment normal.     ED Results / Procedures / Treatments   Labs (all labs ordered are listed, but only abnormal results are displayed) Labs Reviewed  ETHANOL  PROTIME-INR  APTT  CBC  DIFFERENTIAL  COMPREHENSIVE METABOLIC PANEL WITH GFR  RAPID URINE DRUG SCREEN, HOSP PERFORMED  I-STAT CHEM 8, ED  TROPONIN I (HIGH SENSITIVITY)    EKG None  Radiology CT HEAD CODE STROKE WO CONTRAST Result Date: 03/31/2024 EXAM: CT HEAD WITHOUT 03/31/2024 04:49:00 PM TECHNIQUE: CT of the head was performed without the administration of intravenous contrast. Automated exposure control, iterative reconstruction, and/or weight based adjustment of the mA/kV was utilized to reduce the radiation dose to as low as reasonably achievable. COMPARISON: 06/17/2023 CLINICAL HISTORY: Neuro deficit, acute, stroke suspected. Pt states he woke up dizzy with right sided facial droop. Pt reports falling at 2230 last night hitting head. FINDINGS: BRAIN AND VENTRICLES: There is no acute intracranial hemorrhage, mass effect or midline shift. No abnormal extra-axial fluid collection. The gray-white differentiation is maintained without evidence of an acute infarct. There is no evidence of hydrocephalus. 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 The globes and orbits are otherwise within normal limits. SINUSES: The visualized paranasal sinuses and mastoid air cells demonstrate no acute abnormality. SOFT TISSUES AND SKULL: No acute abnormality of the visualized skull or soft tissues. VASCULATURE: Atherosclerotic calcifications are present in the cavernous carotid arteries bilaterally. No hyperdense vessel is present. IMPRESSION: 1. No acute intracranial abnormality related to the reported neuro deficit or head trauma. 2. Atherosclerotic calcifications in the cavernous carotid  arteries bilaterally. No hyperdense vessel. The pertinent results were texted to Dr. Merceda Stairs via the [facility] system at 4:58pm. Electronically signed by: Audree Leas MD 03/31/2024 04:59 PM EDT RP Workstation: [id number]    Procedures Procedures    Medications Ordered in ED Medications  iohexol  (OMNIPAQUE ) 350 MG/ML injection 75 mL (75 mLs Intravenous Contrast Given 03/31/24 1710)    ED Course/ Medical Decision Making/ A&P                                 Medical Decision Making Amount and/or Complexity of Data Reviewed Labs: ordered. Radiology: ordered.  Risk Prescription drug management.   This patient presents to the ED for concern of left-sided weakness, dizziness differential diagnosis includes CVA, TIA, intracranial hemorrhage, migraine, electrolyte derangement, acute kidney injury    Additional history obtained:  Additional history obtained from family External records from outside source obtained and reviewed including medical records   Lab Tests:  I Ordered, and personally interpreted labs.  The pertinent results include: Leukopenia, thrombocytopenia, hyponatremia, elevated creatinine, normal troponin   Imaging Studies ordered:  I ordered imaging studies including CT scan of head, CTA of head, CT of C-spine I independently visualized and interpreted imaging which showed no acute intracranial hemorrhage, no large vessel occlusion, no cervical spine fracture I agree with the radiologist interpretation   Medicines ordered and prescription drug management:  I ordered medication including meclizine , IV fluids for dizziness and acute kidney injury Reevaluation of the patient after these medicines showed that the patient improved I have reviewed the patients home medicines and have made adjustments as needed   Problem List / ED Course:  Patient does remain stable.  On initial presentation patient was made a stroke alert and was evaluated by teleneurology.   Initial CT scan of the head demonstrated no signs of acute intracranial hemorrhage and CTA was unremarkable for any signs of large vessel occlusion.  Patient has had similar symptoms to this previously which has resolved on her own.  Neurology did recommend MRI of  the brain and reevaluation at that time.  Will sign patient out to attending physician pending MRI results.  IV fluids were given for his mild acute kidney injury and mild hyponatremia.   Social Determinants of Health:  None           Final Clinical Impression(s) / ED Diagnoses Final diagnoses:  None    Rx / DC Orders ED Discharge Orders     None         Emmalene Hare 03/31/24 Natale Bail, MD 04/02/24 1035

## 2024-03-31 NOTE — ED Triage Notes (Signed)
 Pt stated that he believes he had a stroke this morning due to left sided weakness and confusion that happened this morning. Pt stated that he has felt better since this morning but now wants to be checked out.

## 2024-03-31 NOTE — Progress Notes (Signed)
 Telestroke cart was activated at 1643. Per treatment team, pt's LKW was at 2230 last night with c/o R facial droop, dizziness, weakness, and a fall. Zammit, EDP, assessed pt prior to cart activation. Pt was transported to CT prior to cart activation and returned to room at 1700. [facility] was paged for code stroke at 1644. TSRN called Dr. Doretta Gant at [phone]. Dr. Doretta Gant, [facility] appeared on telestroke cart at 1655 to assess the patient. Based on [facility]'s assessment, pt does not meet criteria for emergent interventions at this time 2/2 onset greater than 4.5 hours. [facility] to f/u with EDP regarding recommendations. No further needs from Telestroke RN at this time. Telestroke cart disconnected at [employer]    mRS-1

## 2024-03-31 NOTE — ED Provider Notes (Signed)
 MRI was unremarkable.  His dizziness and weakness has improved.  He is discharged home and will follow-up with his family doctor and neurology   Cheyenne Cotta, MD 03/31/24 2102

## 2024-03-31 NOTE — ED Notes (Signed)
 Pt to MRI @ 1800

## 2024-03-31 NOTE — Discharge Instructions (Signed)
 Follow-up with your family doctor next week.  You have also been referred to neurology

## 2024-04-01 NOTE — Consult Note (Signed)
 NEUROLOGY TELECONSULTATION NOTE   Date of service: Apr 01, 2024 Patient Name: [name] [name] MRN:  [id number] DOB:  1956-04-12 Reason for consult: stroke code  Requesting Provider: Dr. Bryna Car Consult Participants: myself, patient, bedside RN, telestroke RN Location of the provider: [facility], [place] Location of the patient: [facility]  This consult was provided via telemedicine with 2-way video and audio communication. The patient/family was informed that care would be provided in this way and agreed to receive care in this manner.   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 LKW 2230 last night before he went to bed. Just before bed he had a mechanical fall and hit his head but "did not hit it hard" and felt ok afterwards. This AM when he woke up he had a pounding headache accompanied by vertigo, nausea, and mild L-sided weakness. He sought care in [facility] ED this evening bc it has not improved. Dizziness is described as primarily lightheadedness but with a small component of room spinning.  It is exacerbated by changes in head position, lasts approximately 60 to 120 seconds, and fully resolves between episodes.  These episodes have been occurring all morning since 5 AM.  Also since that time he has had mild weakness of his left arm and left leg.  Patient reports that he has had similar episodes with his migraines before involving dizziness and L sided weakness that resolve after the headache improves. Today's episode feels similar to episodes in the past.  CT head no acute process on personal review. TNK not administered 2/2 contraindication of being on eliquis , presentation outside of the  window, and findings c/w his typical migraines rather than acute stroke. CTA was not performed 2/2 exam not c/w LVO.  NIHSS = 2 for LUE and LLE  Premorbid mRS = 0   ROS   Per HPI; all other systems reviewed and are negative  Past History   The following was personally reviewed:  Past Medical History:  Diagnosis Date   Anxiety    Aortic insufficiency    Arthritis    Asthma    Cataract    Right eye   Chronic lower back pain    Cirrhosis of liver (HCC)    Coronary atherosclerosis of native coronary artery    a. 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   LIMA to LAD, free RIMA to OM2, SVG to PDA, open SVG harvest from right thigh   Stroke (HCC)    Thrombocytopenia (HCC)    TIA (transient ischemic attack)    Type 2 diabetes mellitus (HCC)    Past Surgical History:  Procedure Laterality  Date   ANTERIOR CERVICAL DECOMP/DISCECTOMY FUSION  1998   "C3-4"   CARDIAC CATHETERIZATION  "several"   CARDIAC CATHETERIZATION N/A 12/30/2015   Procedure: Left Heart Cath and Coronary Angiography;  Surgeon: Avanell Leigh, MD;  Location: [facility] INVASIVE CV LAB;  Service: Cardiovascular;  Laterality: N/A;   CARPAL TUNNEL RELEASE Bilateral 2005   CHOLECYSTECTOMY N/A 11/28/2018   Procedure: LAPAROSCOPIC CHOLECYSTECTOMY;  Surgeon: Awilda Bogus, MD;  Location: [facility] ORS;  Service: General;  Laterality: N/A;   COLONOSCOPY     COLONOSCOPY WITH PROPOFOL  N/A 09/27/2020   Procedure: COLONOSCOPY WITH PROPOFOL ;  Surgeon: Urban Garden, MD;  Location: [facility] ENDO SUITE;  Service: Gastroenterology;  Laterality: N/A;    COLONOSCOPY WITH PROPOFOL  N/A 06/29/2023   Procedure: COLONOSCOPY WITH PROPOFOL ;  Surgeon: Urban Garden, MD;  Location: [facility] ENDO SUITE;  Service: Gastroenterology;  Laterality: N/A;  1:30PM;ASA 3   CORONARY ANGIOPLASTY     CORONARY ANGIOPLASTY WITH STENT PLACEMENT  2002; 2003; 11/20/2014   "I have 4 stents after today" (11/20/2014)   CORONARY ARTERY BYPASS GRAFT N/A 01/09/2016   Procedure: CORONARY ARTERY BYPASS GRAFTING (CABG) X 3 UTILIZING RIGHT AND LEFT INTERNAL MAMMARY ARTERY AND ENDOSCOPICALLY HARVESTED SAPHENEOUS VEIN.;  Surgeon: Gardenia Jump, MD;  Location: [facility] OR;  Service: Open Heart Surgery;  Laterality: N/A;   ESOPHAGOGASTRODUODENOSCOPY     ESOPHAGOGASTRODUODENOSCOPY (EGD) WITH PROPOFOL  N/A 09/27/2020   Procedure: ESOPHAGOGASTRODUODENOSCOPY (EGD) WITH PROPOFOL ;  Surgeon: Urban Garden, MD;  Location: [facility] END[facility] SUITE;  Service: Gastroenterology;  Laterality: N/A;  10   ESOPHAGOGASTRODUODENOSCOPY (EGD) WITH PROPOFOL  N/A 06/29/2023   Procedure: ESOPHAGOGASTRODUODENOSCOPY (EGD) WITH PROPOFOL ;  Surgeon: Urban Garden, MD;  Location: [facility] END[facility] SUITE;  Service: Gastroenterology;  Laterality: N/A;  1:30PM;ASA 3   KNEE SURGERY Left 02/2012   "scraped; open"   LEFT HEART CATH AND CORS/GRAFTS ANGIOGRAPHY N/A 08/09/2017   Procedure: LEFT HEART CATH AND CORS/GRAFTS ANGIOGRAPHY;  Surgeon: Sammy Crisp, MD;  Location: [facility] INVASIVE CV LAB;  Service: Cardiovascular;  Laterality: N/A;   LEFT HEART CATH AND CORS/GRAFTS ANGIOGRAPHY N/A 11/22/2018   Procedure: LEFT HEART CATH AND CORS/GRAFTS ANGIOGRAPHY;  Surgeon: Swaziland, [name], MD;  Location: [facility] INVASIVE CV LAB;  Service: Cardiovascular;  Laterality: N/A;   LEFT HEART CATH AND CORS/GRAFTS ANGIOGRAPHY N/A 08/20/2022   Procedure: LEFT HEART CATH AND CORS/GRAFTS ANGIOGRAPHY;  Surgeon: Odie Benne, MD;  Location: [facility] INVASIVE CV LAB;  Service: Cardiovascular;  Laterality: N/A;   LEFT HEART CATHETERIZATION WITH CORONARY  ANGIOGRAM N/A 07/20/2012   Procedure: LEFT HEART CATHETERIZATION WITH CORONARY ANGIOGRAM;  Surgeon: Wenona Hamilton, MD;  Location: [facility] CATH LAB;  Service: Cardiovascular;  Laterality: N/A;   LEFT HEART CATHETERIZATION WITH CORONARY ANGIOGRAM N/A 11/20/2014   Procedure: LEFT HEART CATHETERIZATION WITH CORONARY ANGIOGRAM;  Surgeon: [name] Swaziland, MD;  Location[facility] CATH LAB;  Service: Cardiovascular;  Laterality: N/A;   LEFT HEART CATHETERIZATION WITH CORONARY ANGIOGRAM N/A 11/26/2014   Procedure: LEFT HEART CATHETERIZATION WITH CORONARY ANGIOGRAM;  Surgeon: [name] Swaziland, MD;  Location: [facility] CATH LAB;  Service: Cardiovascular;  Laterality: N/A;   NEUROPLASTY / TRANSPOSITION ULNAR NERVE AT ELBOW Right ~ 2012   PERCUTANEOUS CORONARY ROTOBLATOR INTERVENTION (PCI-R)  11/20/2014   Procedure: PERCUTANEOUS CORONARY ROTOBLATOR INTERVENTION (PCI-R);  Surgeon: [name] Swaziland, MD;  Location: [facility] CATH LAB;  Service: Cardiovascular;;   POSTERIOR CERVICAL LAMINECTOMY Left 04/16/2022   Procedure: Laminectomy and Foraminotomy - left - C6-C7;  Surgeon: Agustina Aldrich, MD;  Location: [facility] OR;  Service: Neurosurgery;  Laterality: Left;  3C   SHOULDER ARTHROSCOPY Left ~ 2011   TEE WITHOUT CARDIOVERSION N/A 01/09/2016   Procedure: TRANSESOPHAGEAL ECHOCARDIOGRAM (TEE);  Surgeon: Gardenia Jump, MD;  Location: [facility] OR;  Service: Open Heart Surgery;  Laterality: N/A;   Family History  Problem Relation Age of Onset   Stroke Mother    Coronary artery disease Father 54   Asthma Sister    Multiple sclerosis Brother    Heart disease Paternal Uncle    Stroke Maternal Grandmother    Heart attack Paternal Grandmother    Cancer Paternal Grandfather    Asthma Sister    Seizures Son    Migraines Son    Autism Son    Migraines Son    Social History   Socioeconomic History   Marital status: Married    Spouse name: [name]   Number of children: 3   Years of education: 12 th    Highest education level: Not on file  Occupational  History   Occupation: Unemployed  Tobacco Use   Smoking status: Never    Passive exposure: Current   Smokeless tobacco: Never  Vaping Use   Vaping status: Never Used  Substance and Sexual Activity   Alcohol use: No   Drug use: No   Sexual activity: Not on file  Other Topics Concern   Not on file  Social History Narrative   Patient lives at home with wife [name].   Patient works at [employer], Social research officer, government.   Patient has a 12 th grade education.    Patient has 3 children.       Updated 11/30/2023:   Works at [employer] as a Engineer, materials      Social Drivers of Corporate investment banker Strain: Low Risk  (11/30/2023)   Overall Financial Resource Strain (CARDIA)    Difficulty of Paying Living Expenses: Not hard at all  Food Insecurity: No Food Insecurity (11/30/2023)   Hunger Vital Sign    Worried About Running Out of Food in the Last Year: Never true    Ran Out of Food in the Last Year: Never true  Transportation Needs: No Transportation Needs (11/30/2023)   PRAPARE - Administrator, Civil Service (Medical): No    Lack of Transportation (Non-Medical): No  Physical Activity: Sufficiently Active (11/30/2023)   Exercise Vital Sign    Days of Exercise per Week: 3 days    Minutes of Exercise per Session: 60 min  Stress: No Stress Concern Present (11/30/2023)   [employer] of Occupational Health - Occupational Stress Questionnaire    Feeling of Stress : Not at all  Social Connections: Socially Integrated (11/30/2023)   Social Connection and Isolation Panel [NHANES]    Frequency of Communication with Friends and Family: More than three times a week    Frequency of Social Gatherings with Friends and Family: More than three times a week    Attends Religious Services: More than 4 times per year    Active Member of [employer] or Organizations: Yes    Attends Engineer, structural: More than 4 times per year    Marital Status: Married   Allergies   Allergen Reactions   Divalproex Sodium Other (See Comments)    Causes anger   Statins Other (See Comments)    Muscle aches and cramps   Tramadol  Other (See Comments)    Chest pain    Valproic Acid Other (See Comments)  Causes anger   Gadolinium Derivatives Nausea And Vomiting    07/25/19 Pt vomited immediately after IV gad. Denies itching, dyspnea.  (Adverse, not allergic, reaction   Tricor  [Fenofibrate ] Other (See Comments)    Leg cramps    Medications   (Not in a hospital admission)    No current facility-administered medications for this encounter.  Current Outpatient Medications:    albuterol  (VENTOLIN  HFA) 108 (90 Base) MCG/ACT inhaler, Inhale 2 puffs into the lungs every 6 (six) hours as needed for wheezing or shortness of breath., Disp: , Rfl:    carvedilol  (COREG ) 6.25 MG tablet, Take 1 tablet (6.25 mg total) by mouth 2 (two) times daily with a meal., Disp: 180 tablet, Rfl: 3   citalopram  (CELEXA ) 20 MG tablet, TAKE 1 TABLET BY MOUTH AT BEDTIME., Disp: 30 tablet, Rfl: 10   Continuous Glucose Receiver (FREESTYLE LIBRE 2 READER) DEVI, 1 Device by Does not apply route continuous., Disp: 1 each, Rfl: 0   Continuous Glucose Sensor (FREESTYLE LIBRE 2 SENSOR) MISC, USE 1 EACH FOR 14 DAYS, Disp: 2 each, Rfl: 10   cyclobenzaprine  (FLEXERIL ) 10 MG tablet, TAKE 1 TABLET BY MOUTH 3 TIMES DAILY AS NEEDED FOR MUSCLE SPASMS, Disp: 30 tablet, Rfl: 10   ELIQUIS  5 MG TABS tablet, TAKE 1 TABLET BY MOUTH TWICE DAILY, Disp: 60 tablet, Rfl: 10   ezetimibe  (ZETIA ) 10 MG tablet, TAKE 1 TABLET BY MOUTH ONCE DAILY, Disp: 30 tablet, Rfl: 10   FARXIGA  5 MG TABS tablet, TAKE 1 TABLET BY MOUTH DAILY BEFORE BREAKFAST, Disp: 30 tablet, Rfl: 10   fluticasone  (FLONASE ) 50 MCG/ACT nasal spray, PLACE 2 SPRAYS INTO BOTH NOSTRILS 2 TIMES DAILY., Disp: 16 g, Rfl: 11   fluticasone -salmeterol (ADVAIR ) 250-50 MCG/ACT AEPB, Inhale 1 puff into the lungs 2 (two) times daily., Disp: 60 each, Rfl: 10   gabapentin   (NEURONTIN ) 300 MG capsule, TAKE 1 CAPSULE BY MOUTH THREE TIMES DAILY, Disp: 90 capsule, Rfl: 10   isosorbide  mononitrate (IMDUR ) 60 MG 24 hr tablet, TAKE 1 TABLET (60MG ) BY MOUTH IN THE MORNING AND 1/2 TABLET (30MG ) IN THE EVENING, Disp: 45 tablet, Rfl: 10   Melatonin 10 MG TABS, Take 10 mg by mouth at bedtime., Disp: , Rfl:    nitroGLYCERIN  (NITROSTAT ) 0.4 MG SL tablet, DISSOLVE 1 TABLET UNDER THE TONGUE AS NEEDED FOR CHEST PAIN EVERY 5 MINUTES UP TO 3 TIMES. IF NO RELIEF CALL 911., Disp: 25 tablet, Rfl: 10   omeprazole  (PRILOSEC) 40 MG capsule, Take 1 capsule (40 mg total) by mouth daily., Disp: 30 capsule, Rfl: 10   pravastatin  (PRAVACHOL ) 20 MG tablet, TAKE 1 TABLET BY MOUTH DAILY., Disp: 30 tablet, Rfl: 10   tirzepatide  ([name] ) 5 MG/0.5ML Pen, Inject 5 mg into the skin once a week., Disp: 2 mL, Rfl: 10   topiramate  (TOPAMAX ) 50 MG tablet, TAKE 1 TABLET BY MOUTH TWICE DAILY, Disp: 180 tablet, Rfl: 11   zonisamide  (ZONEGRAN ) 100 MG capsule, Take 100 mg by mouth daily., Disp: , Rfl:   Vitals   Vitals:   03/31/24 2030 03/31/24 2033 03/31/24 2045 03/31/24 2100  BP: 117/65  116/63 120/74  Pulse: 67  67 70  Resp: 13  15 17   Temp:  98.7 F (37.1 C)    TempSrc:  Oral    SpO2: 95%  97% 98%  Weight:      Height:         Body mass index is 29.98 kg/m.  Physical Exam   Exam performed over telemedicine  with 2-way video and audio communication and with assistance of bedside RN  Physical Exam Gen: A&O x4, NAD Resp: normal WOB CV: extremities appear well-perfused  Neuro: *MS: A&O x4. Follows multi-step commands.  *Speech: nondysarthric, no aphasia, able to name and repeat *CN: PERRL 3mm, EOMI, VFF by confrontation, sensation intact, smile symmetric, hearing intact to voice *Motor:   Normal bulk.  No tremor, rigidity or bradykinesia. Minimal drift in LUE and LLE, no drift in RUE or RLE. *Sensory: SILT. Symmetric. No double-simultaneous extinction.  *Coordination:  Finger-to-nose,  heel-to-shin, rapid alternating motions were intact. *Reflexes:  UTA 2/2 tele-exam *Gait: deferred  NIHSS = 2 for drift in LUE and LLE    Labs   CBC:  Recent Labs  Lab 03/31/24 1718  WBC 2.1*  NEUTROABS 1.0*  HGB 14.4  HCT 39.3  MCV 88.5  PLT 56*    Basic Metabolic Panel:  Lab Results  Component Value Date   NA 133 (L) 03/31/2024   K 3.5 03/31/2024   CO2 20 (L) 03/31/2024   GLUCOSE 121 (H) 03/31/2024   BUN 21 03/31/2024   CREATININE 1.27 (H) 03/31/2024   CALCIUM  7.9 (L) 03/31/2024   GFRNONAA >60 03/31/2024   GFRAA >60 07/27/2020   Lipid Panel:  Lab Results  Component Value Date   LDLCALC 91 02/15/2024   HgbA1c:  Lab Results  Component Value Date   HGBA1C 7.6 (H) 02/15/2024   Urine Drug Screen:     Component Value Date/Time   LABOPIA NONE DETECTED 03/31/2024 1640   COCAINSCRNUR NONE DETECTED 03/31/2024 1640   LABBENZ NONE DETECTED 03/31/2024 1640   AMPHETMU NONE DETECTED 03/31/2024 1640   THCU NONE DETECTED 03/31/2024 1640   LABBARB NONE DETECTED 03/31/2024 1640    Alcohol Level     Component Value Date/Time   ETH <15 03/31/2024 1718    CT Head without contrast: No acute process personal review  Impression   68 year old gentleman with a past medical history significant for chronic low back pain, liver cirrhosis, CAD, hypertension, hearing loss of the left ear, hyperlipidemia, migraines, obstructive sleep apnea, 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 Presentation today with pounding headache, frequent bouts of positional vertigo, and mild L sided weakness is similar to multiple previous episodes of atypical migraine in the past that resolve without intervention. Suspicion for stroke is low since today's presentation is essentially identical to previous migraine attacks. Patient agrees that today's episode feels similar to previous. Given his cerebrovascular risk factors it is reasonable to obtain MRI to r/o central  etiology but if this does not show acute infarct patient may be discharged from ED without further inpatient neurologic workup.  Recommendations   - MRI brain wo contrast - Migraine cocktail - If MRI shows no e/o acute infarct, no further inpatient neurologic workup indicated - Patient may benefit from outpatient referral to neurology for migraine mgmt  Please call with any questions ______________________________________________________________________   Thank you for the opportunity to take part in the care of this patient. If you have any further questions, please contact the neurology consultation attending.  Signed,  Greg Leaks, MD [facility] [phone]  If 7pm- 7am, please page neurology on call as listed in AMION.  **Any copied and pasted documentation in this note was written by me in another application not billed for and pasted by me into this document.

## 2024-04-07 ENCOUNTER — Encounter: Payer: Self-pay | Admitting: Family Medicine

## 2024-04-07 ENCOUNTER — Ambulatory Visit (INDEPENDENT_AMBULATORY_CARE_PROVIDER_SITE_OTHER): Admitting: Family Medicine

## 2024-04-07 VITALS — BP 102/65 | HR 65 | Resp 16 | Ht 69.0 in | Wt 204.8 lb

## 2024-04-07 DIAGNOSIS — R531 Weakness: Secondary | ICD-10-CM

## 2024-04-07 NOTE — Patient Instructions (Addendum)
 I appreciate the opportunity to provide care to you today!    Follow up:  4 months  Labs: please stop by the lab today to get your blood drawn (CBC, BMP)  Weakness When to Seek Medical Attention: Please go to the emergency room if you experience:  -Sudden or severe weakness -Weakness on one side of the body -Slurred speech, facial drooping, or vision changes -Difficulty breathing or swallowing -Weakness that worsens or doesn't improve  What You Can Do: -Rest as needed, especially if the weakness is due to illness or fatigue. -Stay hydrated and eat a balanced diet rich in vitamins and minerals. -Track your symptoms - note when the weakness occurs, how long it lasts, and any associated symptoms (e.g., dizziness, numbness).   Please follow up if your symptoms worsen or fail to improve.   Referrals today- Neurology    Please continue to a heart-healthy diet and increase your physical activities. Try to exercise for at least five days a week.    It was a pleasure to see you and I look forward to continuing to work together on your health and well-being. Please do not hesitate to call the office if you need care or have questions about your care.  In case of emergency, please visit the Emergency Department for urgent care, or contact our clinic at [phone] to schedule an appointment. We're here to help you!   Have a wonderful day and week. With Gratitude, Jaliah Foody MSN, FNP-BC

## 2024-04-07 NOTE — Progress Notes (Unsigned)
 Established Patient Office Visit  Subjective:  Patient ID: [name] [name], male    DOB: 03/14/1956  Age: 68 y.o. MRN: [id number]  CC:  Chief Complaint  Patient presents with  . ER follow up     Was seen 5/23 for weakness    HPI [name] [name] is a 68 y.o. male with past medical history of PVC, TIA, CAD and obstructive sleep apnea presents for f/u of *** chronic medical conditions.  Past Medical History:  Diagnosis Date  . Anxiety   . Aortic insufficiency   . Arthritis   . Asthma   . Cataract    Right eye  . Chronic lower back pain   . Cirrhosis of liver (HCC)   . Coronary atherosclerosis of native coronary artery    a. s/p multiple prior stents, s/p CABG in 01/2016 with LIMA-LAD, Free RIMA-OM2 and SVG-PDA  . Essential hypertension   . Fatty liver   . GERD (gastroesophageal reflux disease)   . Hearing loss of left ear   . History of gout   . History of hiatal hernia   . Hypercholesteremia   . Iron  deficiency 08/27/2021  . Lumbar herniated disc   . MI, old 2017  . Migraine   . OSA (obstructive sleep apnea)   . 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 Stroke (HCC)   . Thrombocytopenia (HCC)   . TIA (transient ischemic attack)   . Type 2 diabetes mellitus (HCC)     Past Surgical History:  Procedure Laterality Date  . ANTERIOR CERVICAL DECOMP/DISCECTOMY FUSION  1998   "C3-4"  . CARDIAC CATHETERIZATION  "several"  . CARDIAC CATHETERIZATION N/A 12/30/2015   Procedure: Left Heart Cath and Coronary Angiography;  Surgeon: Avanell Leigh, MD;  Location: [facility] INVASIVE CV LAB;  Service: Cardiovascular;  Laterality: N/A;  . CARPAL TUNNEL RELEASE Bilateral 2005  . CHOLECYSTECTOMY N/A 11/28/2018   Procedure: LAPAROSCOPIC CHOLECYSTECTOMY;  Surgeon: Awilda Bogus, MD;  Location: [facility] ORS;  Service: General;  Laterality: N/A;  . COLONOSCOPY    . COLONOSCOPY WITH PROPOFOL  N/A 09/27/2020   Procedure:  COLONOSCOPY WITH PROPOFOL ;  Surgeon: Urban Garden, MD;  Location: [facility] END[facility] SUITE;  Service: Gastroenterology;  Laterality: N/A;  . COLONOSCOPY WITH PROPOFOL  N/A 06/29/2023   Procedure: COLONOSCOPY WITH PROPOFOL ;  Surgeon: Urban Garden, MD;  Location: [facility];  Service: Gastroenterology;  Laterality: N/A;  1:30PM;ASA 3  . CORONARY ANGIOPLASTY    . CORONARY ANGIOPLASTY WITH STENT PLACEMENT  2002; 2003; 11/20/2014   "I have 4 stents after today" (11/20/2014)  . CORONARY ARTERY BYPASS GRAFT N/A 01/09/2016   Procedure: CORONARY ARTERY BYPASS GRAFTING (CABG) X 3 UTILIZING RIGHT AND LEFT INTERNAL MAMMARY ARTERY AND ENDOSCOPICALLY HARVESTED SAPHENEOUS VEIN.;  Surgeon: Gardenia Jump, MD;  Location: [facility] OR;  Service: Open Heart Surgery;  Laterality: N/A;  . ESOPHAGOGASTRODUODENOSCOPY    . ESOPHAGOGASTRODUODENOSCOPY (EGD) WITH PROPOFOL  N/A 09/27/2020   Procedure: ESOPHAGOGASTRODUODENOSCOPY (EGD) WITH PROPOFOL ;  Surgeon: Urban Garden, MD;  Location: [facility];  Service: Gastroenterology;  Laterality: N/A;  10  . ESOPHAGOGASTRODUODENOSCOPY (EGD) WITH PROPOFOL  N/A 06/29/2023   Procedure: ESOPHAGOGASTRODUODENOSCOPY (EGD) WITH PROPOFOL ;  Surgeon: Urban Garden, MD;  Location: [facility];  Service: Gastroenterology;  Laterality: N/A;  1:30PM;ASA 3  . KNEE SURGERY Left 02/2012   "scraped; open"  . LEFT HEART CATH AND CORS/GRAFTS ANGIOGRAPHY N/A  08/09/2017   Procedure: LEFT HEART CATH AND CORS/GRAFTS ANGIOGRAPHY;  Surgeon: Sammy Crisp, MD;  Location: [facility] INVASIVE CV LAB;  Service: Cardiovascular;  Laterality: N/A;  . LEFT HEART CATH AND CORS/GRAFTS ANGIOGRAPHY N/A 11/22/2018   Procedure: LEFT HEART CATH AND CORS/GRAFTS ANGIOGRAPHY;  Surgeon: Swaziland, [name], MD;  Location: [facility] INVASIVE CV LAB;  Service: Cardiovascular;  Laterality: N/A;  . LEFT HEART CATH AND CORS/GRAFTS ANGIOGRAPHY N/A 08/20/2022   Procedure: LEFT HEART CATH AND CORS/GRAFTS ANGIOGRAPHY;   Surgeon: Odie Benne, MD;  Location: [facility] INVASIVE CV LAB;  Service: Cardiovascular;  Laterality: N/A;  . LEFT HEART CATHETERIZATION WITH CORONARY ANGIOGRAM N/A 07/20/2012   Procedure: LEFT HEART CATHETERIZATION WITH CORONARY ANGIOGRAM;  Surgeon: Wenona Hamilton, MD;  Location: [facility] CATH LAB;  Service: Cardiovascular;  Laterality: N/A;  . LEFT HEART CATHETERIZATION WITH CORONARY ANGIOGRAM N/A 11/20/2014   Procedure: LEFT HEART CATHETERIZATION WITH CORONARY ANGIOGRAM;  Surgeon: [name] Swaziland, MD;  Location: [facility] CATH LAB;  Service: Cardiovascular;  Laterality: N/A;  . LEFT HEART CATHETERIZATION WITH CORONARY ANGIOGRAM N/A 11/26/2014   Procedure: LEFT HEART CATHETERIZATION WITH CORONARY ANGIOGRAM;  Surgeon: [name] Swaziland, MD;  Location: [facility] CATH LAB;  Service: Cardiovascular;  Laterality: N/A;  . NEUROPLASTY / TRANSPOSITION ULNAR NERVE AT ELBOW Right ~ 2012  . PERCUTANEOUS CORONARY ROTOBLATOR INTERVENTION (PCI-R)  11/20/2014   Procedure: PERCUTANEOUS CORONARY ROTOBLATOR INTERVENTION (PCI-R);  Surgeon: [name] Swaziland, MD;  Location: [facility] CATH LAB;  Service: Cardiovascular;;  . POSTERIOR CERVICAL LAMINECTOMY Left 04/16/2022   Procedure: Laminectomy and Foraminotomy - left - C6-C7;  Surgeon: Agustina Aldrich, MD;  Location: [facility] OR;  Service: Neurosurgery;  Laterality: Left;  3C  . SHOULDER ARTHROSCOPY Left ~ 2011  . TEE WITHOUT CARDIOVERSION N/A 01/09/2016   Procedure: TRANSESOPHAGEAL ECHOCARDIOGRAM (TEE);  Surgeon: Gardenia Jump, MD;  Location: [facility] OR;  Service: Open Heart Surgery;  Laterality: N/A;    Family History  Problem Relation Age of Onset  . Stroke Mother   . Coronary artery disease Father 58  . Asthma Sister   . Multiple sclerosis Brother   . Heart disease Paternal Uncle   . Stroke Maternal Grandmother   . Heart attack Paternal Grandmother   . Cancer Paternal Grandfather   . Asthma Sister   . Seizures Son   . Migraines Son   . Autism Son   . Migraines Son     Social History    Socioeconomic History  . Marital status: Married    Spouse name: [name]  . Number of children: 3  . Years of education: 86 th   . Highest education level: Not on file  Occupational History  . Occupation: Unemployed  Tobacco Use  . Smoking status: Never    Passive exposure: Current  . Smokeless tobacco: Never  Vaping Use  . Vaping status: Never Used  Substance and Sexual Activity  . Alcohol use: No  . Drug use: No  . Sexual activity: Not on file  Other Topics Concern  . Not on file  Social History Narrative   Patient lives at home with wife [name].   Patient works at [employer], Social research officer, government.   Patient has a 12 th grade education.    Patient has 3 children.       Updated 11/30/2023:   Works at [employer] as a Engineer, materials      Social Drivers of Corporate investment banker Strain: Low Risk  (11/30/2023)   Overall Financial Resource Strain (CARDIA)   .  Difficulty of Paying Living Expenses: Not hard at all  Food Insecurity: No Food Insecurity (11/30/2023)   Hunger Vital Sign   . Worried About Programme researcher, broadcasting/film/video in the Last Year: Never true   . Ran Out of Food in the Last Year: Never true  Transportation Needs: No Transportation Needs (11/30/2023)   PRAPARE - Transportation   . Lack of Transportation (Medical): No   . Lack of Transportation (Non-Medical): No  Physical Activity: Sufficiently Active (11/30/2023)   Exercise Vital Sign   . Days of Exercise per Week: 3 days   . Minutes of Exercise per Session: 60 min  Stress: No Stress Concern Present (11/30/2023)   [employer] of Occupational Health - Occupational Stress Questionnaire   . Feeling of Stress : Not at all  Social Connections: Socially Integrated (11/30/2023)   Social Connection and Isolation Panel [NHANES]   . Frequency of Communication with Friends and Family: More than three times a week   . Frequency of Social Gatherings with Friends and Family: More than three times a week   . Attends  Religious Services: More than 4 times per year   . Active Member of Clubs or Organizations: Yes   . Attends Banker Meetings: More than 4 times per year   . Marital Status: Married  Catering manager Violence: Not At Risk (11/30/2023)   Humiliation, Afraid, Rape, and Kick questionnaire   . Fear of Current or Ex-Partner: No   . Emotionally Abused: No   . Physically Abused: No   . Sexually Abused: No    Outpatient Medications Prior to Visit  Medication Sig Dispense Refill  . albuterol  (VENTOLIN  HFA) 108 (90 Base) MCG/ACT inhaler Inhale 2 puffs into the lungs every 6 (six) hours as needed for wheezing or shortness of breath.    . carvedilol  (COREG ) 6.25 MG tablet Take 1 tablet (6.25 mg total) by mouth 2 (two) times daily with a meal. 180 tablet 3  . citalopram  (CELEXA ) 20 MG tablet TAKE 1 TABLET BY MOUTH AT BEDTIME. 30 tablet 10  . Continuous Glucose Receiver (FREESTYLE LIBRE 2 READER) DEVI 1 Device by Does not apply route continuous. 1 each 0  . Continuous Glucose Sensor (FREESTYLE LIBRE 2 SENSOR) MISC USE 1 EACH FOR 14 DAYS 2 each 10  . cyclobenzaprine  (FLEXERIL ) 10 MG tablet TAKE 1 TABLET BY MOUTH 3 TIMES DAILY AS NEEDED FOR MUSCLE SPASMS 30 tablet 10  . ELIQUIS  5 MG TABS tablet TAKE 1 TABLET BY MOUTH TWICE DAILY 60 tablet 10  . ezetimibe  (ZETIA ) 10 MG tablet TAKE 1 TABLET BY MOUTH ONCE DAILY 30 tablet 10  . FARXIGA  5 MG TABS tablet TAKE 1 TABLET BY MOUTH DAILY BEFORE BREAKFAST 30 tablet 10  . fluticasone  (FLONASE ) 50 MCG/ACT nasal spray PLACE 2 SPRAYS INTO BOTH NOSTRILS 2 TIMES DAILY. 16 g 11  . fluticasone -salmeterol (ADVAIR ) 250-50 MCG/ACT AEPB Inhale 1 puff into the lungs 2 (two) times daily. 60 each 10  . gabapentin  (NEURONTIN ) 300 MG capsule TAKE 1 CAPSULE BY MOUTH THREE TIMES DAILY 90 capsule 10  . isosorbide  mononitrate (IMDUR ) 60 MG 24 hr tablet TAKE 1 TABLET (60MG ) BY MOUTH IN THE MORNING AND 1/2 TABLET (30MG ) IN THE EVENING 45 tablet 10  . Melatonin 10 MG TABS Take 10  mg by mouth at bedtime.    . nitroGLYCERIN  (NITROSTAT ) 0.4 MG SL tablet DISSOLVE 1 TABLET UNDER THE TONGUE AS NEEDED FOR CHEST PAIN EVERY 5 MINUTES UP TO 3 TIMES. IF NO  RELIEF CALL 911. 25 tablet 10  . omeprazole  (PRILOSEC) 40 MG capsule Take 1 capsule (40 mg total) by mouth daily. 30 capsule 10  . pravastatin  (PRAVACHOL ) 20 MG tablet TAKE 1 TABLET BY MOUTH DAILY. 30 tablet 10  . tirzepatide  ([name] ) 5 MG/0.5ML Pen Inject 5 mg into the skin once a week. 2 mL 10  . topiramate  (TOPAMAX ) 50 MG tablet TAKE 1 TABLET BY MOUTH TWICE DAILY 180 tablet 11  . zonisamide  (ZONEGRAN ) 100 MG capsule Take 100 mg by mouth daily.     No facility-administered medications prior to visit.    Allergies  Allergen Reactions  . Divalproex Sodium Other (See Comments)    Causes anger  . Statins Other (See Comments)    Muscle aches and cramps  . Tramadol  Other (See Comments)    Chest pain   . Valproic Acid Other (See Comments)    Causes anger  . Gadolinium Derivatives Nausea And Vomiting    07/25/19 Pt vomited immediately after IV gad. Denies itching, dyspnea.  (Adverse, not allergic, reaction  . Tricor  [Fenofibrate ] Other (See Comments)    Leg cramps    ROS Review of Systems    Objective:     Physical Exam  BP 102/65   Pulse 65   Resp 16   Ht 5\' 9"  (1.753 m)   Wt 204 lb 12.8 oz (92.9 kg)   SpO2 97%   BMI 30.24 kg/m  Wt Readings from Last 3 Encounters:  04/07/24 204 lb 12.8 oz (92.9 kg)  03/31/24 203 lb (92.1 kg)  03/07/24 208 lb 12.8 oz (94.7 kg)    Lab Results  Component Value Date   TSH 2.380 02/15/2024   Lab Results  Component Value Date   WBC 2.1 (L) 03/31/2024   HGB 14.4 03/31/2024   HCT 39.3 03/31/2024   MCV 88.5 03/31/2024   PLT 56 (L) 03/31/2024   Lab Results  Component Value Date   NA 133 (L) 03/31/2024   K 3.5 03/31/2024   CO2 20 (L) 03/31/2024   GLUCOSE 121 (H) 03/31/2024   BUN 21 03/31/2024   CREATININE 1.27 (H) 03/31/2024   BILITOT 0.9 03/31/2024   ALKPHOS 48  03/31/2024   AST 30 03/31/2024   ALT 29 03/31/2024   PROT 6.1 (L) 03/31/2024   ALBUMIN  3.2 (L) 03/31/2024   CALCIUM  7.9 (L) 03/31/2024   ANIONGAP 5 03/31/2024   EGFR 68 02/15/2024   Lab Results  Component Value Date   CHOL 154 02/15/2024   Lab Results  Component Value Date   HDL 37 (L) 02/15/2024   Lab Results  Component Value Date   LDLCALC 91 02/15/2024   Lab Results  Component Value Date   TRIG 145 02/15/2024   Lab Results  Component Value Date   CHOLHDL 4.2 02/15/2024   Lab Results  Component Value Date   HGBA1C 7.6 (H) 02/15/2024      Assessment & Plan:  There are no diagnoses linked to this encounter.  Follow-up: No follow-ups on file.   Daleigh Pollinger, FNP

## 2024-04-08 LAB — CBC WITH DIFFERENTIAL/PLATELET
Basophils Absolute: 0 10*3/uL (ref 0.0–0.2)
Basos: 0 %
EOS (ABSOLUTE): 0.1 10*3/uL (ref 0.0–0.4)
Eos: 2 %
Hematocrit: 47 % (ref 37.5–51.0)
Hemoglobin: 16.1 g/dL (ref 13.0–17.7)
Immature Grans (Abs): 0 10*3/uL (ref 0.0–0.1)
Immature Granulocytes: 0 %
Lymphocytes Absolute: 0.7 10*3/uL (ref 0.7–3.1)
Lymphs: 21 %
MCH: 31.8 pg (ref 26.6–33.0)
MCHC: 34.3 g/dL (ref 31.5–35.7)
MCV: 93 fL (ref 79–97)
Monocytes Absolute: 0.2 10*3/uL (ref 0.1–0.9)
Monocytes: 7 %
Neutrophils Absolute: 2.3 10*3/uL (ref 1.4–7.0)
Neutrophils: 70 %
Platelets: 71 10*3/uL — CL (ref 150–450)
RBC: 5.07 x10E6/uL (ref 4.14–5.80)
RDW: 13.6 % (ref 11.6–15.4)
WBC: 3.3 10*3/uL — ABNORMAL LOW (ref 3.4–10.8)

## 2024-04-08 LAB — BMP8+EGFR
BUN/Creatinine Ratio: 17 (ref 10–24)
BUN: 18 mg/dL (ref 8–27)
CO2: 19 mmol/L — ABNORMAL LOW (ref 20–29)
Calcium: 9.2 mg/dL (ref 8.6–10.2)
Chloride: 105 mmol/L (ref 96–106)
Creatinine, Ser: 1.09 mg/dL (ref 0.76–1.27)
Glucose: 115 mg/dL — ABNORMAL HIGH (ref 70–99)
Potassium: 4.1 mmol/L (ref 3.5–5.2)
Sodium: 141 mmol/L (ref 134–144)
eGFR: 75 mL/min/{1.73_m2} (ref >59)

## 2024-04-10 DIAGNOSIS — R531 Weakness: Secondary | ICD-10-CM | POA: Insufficient documentation

## 2024-04-10 NOTE — Assessment & Plan Note (Addendum)
 Imaging studies were reviewed, and basic metabolic panel (BMP) and complete blood count (CBC) are currently pending. A referral to neurology will be placed given the patient's prior history of cerebrovascular accident (CVA) and ongoing complaints of weakness. The patient is encouraged to increase fluid intake to at least 64 ounces daily, change positions slowly to minimize dizziness, and keep the home environment free of clutter to reduce fall risk. He is advised to remain compliant with meclizine  25 mg as needed for dizziness and vertigo symptoms. The patient is encouraged to follow up if symptoms worsen or fail to improve.

## 2024-04-16 ENCOUNTER — Ambulatory Visit: Payer: Self-pay | Admitting: Family Medicine

## 2024-04-17 ENCOUNTER — Other Ambulatory Visit: Payer: Self-pay | Admitting: Internal Medicine

## 2024-04-18 ENCOUNTER — Ambulatory Visit: Payer: 59 | Admitting: Dermatology

## 2024-04-20 ENCOUNTER — Ambulatory Visit: Admitting: Nurse Practitioner

## 2024-04-20 ENCOUNTER — Encounter: Payer: Self-pay | Admitting: Nurse Practitioner

## 2024-05-01 ENCOUNTER — Telehealth: Payer: Self-pay

## 2024-05-01 ENCOUNTER — Other Ambulatory Visit: Payer: Self-pay

## 2024-05-01 DIAGNOSIS — Z794 Long term (current) use of insulin: Secondary | ICD-10-CM

## 2024-05-01 MED ORDER — TIRZEPATIDE 7.5 MG/0.5ML ~~LOC~~ SOAJ: 7.5000 mg | SUBCUTANEOUS | 1 refills | Status: DC

## 2024-05-01 NOTE — Telephone Encounter (Signed)
 Copied from CRM [id number]. Topic: Clinical - Prescription Issue >> May 01, 2024 12:11 PM Everette C wrote: Reason for CRM: The patient's wife has called to request contact with a member of clinical staff when possible to discuss the increase and refill of the patient's prescription for tirzepatide  ([name] ) 5 MG/0.5ML Pen [[id number]]. The patient's pharmacy has directed their significant other to contact their PCP

## 2024-05-02 NOTE — Telephone Encounter (Signed)
 Wife advised.

## 2024-05-17 ENCOUNTER — Other Ambulatory Visit: Payer: Self-pay | Admitting: Internal Medicine

## 2024-05-22 ENCOUNTER — Encounter (INDEPENDENT_AMBULATORY_CARE_PROVIDER_SITE_OTHER): Payer: Self-pay

## 2024-05-22 ENCOUNTER — Telehealth: Payer: Self-pay | Admitting: Family Medicine

## 2024-05-22 NOTE — Telephone Encounter (Signed)
 Copied from CRM [id number]. Topic: General - Other >> May 22, 2024  9:25 AM Donna BRAVO wrote: Reason for CRM:  Mylashia with [facility]  [phone] Asking for the the calibrating provider for Gloria Zarwolo FNP  Renewal provider: Melvenia Manus BRAVO, MD

## 2024-05-26 ENCOUNTER — Ambulatory Visit (INDEPENDENT_AMBULATORY_CARE_PROVIDER_SITE_OTHER): Admitting: Audiology

## 2024-05-26 ENCOUNTER — Institutional Professional Consult (permissible substitution) (INDEPENDENT_AMBULATORY_CARE_PROVIDER_SITE_OTHER): Admitting: Otolaryngology

## 2024-05-27 DIAGNOSIS — R161 Splenomegaly, not elsewhere classified: Secondary | ICD-10-CM | POA: Diagnosis not present

## 2024-05-27 DIAGNOSIS — I67841 Reversible cerebrovascular vasoconstriction syndrome: Secondary | ICD-10-CM | POA: Diagnosis not present

## 2024-05-27 DIAGNOSIS — R4702 Dysphasia: Secondary | ICD-10-CM | POA: Diagnosis not present

## 2024-05-27 DIAGNOSIS — G9349 Other encephalopathy: Secondary | ICD-10-CM | POA: Diagnosis not present

## 2024-05-27 DIAGNOSIS — Z86711 Personal history of pulmonary embolism: Secondary | ICD-10-CM | POA: Diagnosis not present

## 2024-05-27 DIAGNOSIS — G9341 Metabolic encephalopathy: Secondary | ICD-10-CM | POA: Diagnosis not present

## 2024-05-27 DIAGNOSIS — Z794 Long term (current) use of insulin: Secondary | ICD-10-CM | POA: Diagnosis not present

## 2024-05-27 DIAGNOSIS — R1111 Vomiting without nausea: Secondary | ICD-10-CM | POA: Diagnosis not present

## 2024-05-27 DIAGNOSIS — R0902 Hypoxemia: Secondary | ICD-10-CM | POA: Diagnosis not present

## 2024-05-27 DIAGNOSIS — R6889 Other general symptoms and signs: Secondary | ICD-10-CM | POA: Diagnosis not present

## 2024-05-27 DIAGNOSIS — Z8673 Personal history of transient ischemic attack (TIA), and cerebral infarction without residual deficits: Secondary | ICD-10-CM | POA: Diagnosis not present

## 2024-05-27 DIAGNOSIS — Z743 Need for continuous supervision: Secondary | ICD-10-CM | POA: Diagnosis not present

## 2024-05-27 DIAGNOSIS — J45909 Unspecified asthma, uncomplicated: Secondary | ICD-10-CM | POA: Diagnosis not present

## 2024-05-27 DIAGNOSIS — J8489 Other specified interstitial pulmonary diseases: Secondary | ICD-10-CM | POA: Diagnosis not present

## 2024-05-27 DIAGNOSIS — E785 Hyperlipidemia, unspecified: Secondary | ICD-10-CM | POA: Diagnosis not present

## 2024-05-27 DIAGNOSIS — K219 Gastro-esophageal reflux disease without esophagitis: Secondary | ICD-10-CM | POA: Diagnosis not present

## 2024-05-27 DIAGNOSIS — E876 Hypokalemia: Secondary | ICD-10-CM | POA: Diagnosis not present

## 2024-05-27 DIAGNOSIS — R112 Nausea with vomiting, unspecified: Secondary | ICD-10-CM | POA: Diagnosis not present

## 2024-05-27 DIAGNOSIS — J438 Other emphysema: Secondary | ICD-10-CM | POA: Diagnosis not present

## 2024-05-27 DIAGNOSIS — Z951 Presence of aortocoronary bypass graft: Secondary | ICD-10-CM | POA: Diagnosis not present

## 2024-05-27 DIAGNOSIS — R918 Other nonspecific abnormal finding of lung field: Secondary | ICD-10-CM | POA: Diagnosis not present

## 2024-05-27 DIAGNOSIS — E119 Type 2 diabetes mellitus without complications: Secondary | ICD-10-CM | POA: Diagnosis not present

## 2024-05-27 DIAGNOSIS — Z7901 Long term (current) use of anticoagulants: Secondary | ICD-10-CM | POA: Diagnosis not present

## 2024-05-27 DIAGNOSIS — Z79899 Other long term (current) drug therapy: Secondary | ICD-10-CM | POA: Diagnosis not present

## 2024-05-27 DIAGNOSIS — R451 Restlessness and agitation: Secondary | ICD-10-CM | POA: Diagnosis not present

## 2024-05-27 DIAGNOSIS — R404 Transient alteration of awareness: Secondary | ICD-10-CM | POA: Diagnosis not present

## 2024-05-27 DIAGNOSIS — I351 Nonrheumatic aortic (valve) insufficiency: Secondary | ICD-10-CM | POA: Diagnosis not present

## 2024-05-27 DIAGNOSIS — Z9049 Acquired absence of other specified parts of digestive tract: Secondary | ICD-10-CM | POA: Diagnosis not present

## 2024-05-27 DIAGNOSIS — T50995A Adverse effect of other drugs, medicaments and biological substances, initial encounter: Secondary | ICD-10-CM | POA: Diagnosis not present

## 2024-05-27 DIAGNOSIS — Z781 Physical restraint status: Secondary | ICD-10-CM | POA: Diagnosis not present

## 2024-05-27 DIAGNOSIS — R29810 Facial weakness: Secondary | ICD-10-CM | POA: Diagnosis not present

## 2024-05-27 DIAGNOSIS — Z7951 Long term (current) use of inhaled steroids: Secondary | ICD-10-CM | POA: Diagnosis not present

## 2024-05-27 DIAGNOSIS — K3189 Other diseases of stomach and duodenum: Secondary | ICD-10-CM | POA: Diagnosis not present

## 2024-05-28 DIAGNOSIS — R569 Unspecified convulsions: Secondary | ICD-10-CM | POA: Diagnosis not present

## 2024-05-28 DIAGNOSIS — I639 Cerebral infarction, unspecified: Secondary | ICD-10-CM | POA: Diagnosis not present

## 2024-05-28 DIAGNOSIS — R29818 Other symptoms and signs involving the nervous system: Secondary | ICD-10-CM | POA: Diagnosis not present

## 2024-05-28 DIAGNOSIS — G9341 Metabolic encephalopathy: Secondary | ICD-10-CM | POA: Diagnosis not present

## 2024-05-28 DIAGNOSIS — R140 Abdominal distension (gaseous): Secondary | ICD-10-CM | POA: Diagnosis not present

## 2024-05-29 DIAGNOSIS — I517 Cardiomegaly: Secondary | ICD-10-CM | POA: Diagnosis not present

## 2024-05-29 DIAGNOSIS — R29898 Other symptoms and signs involving the musculoskeletal system: Secondary | ICD-10-CM | POA: Diagnosis not present

## 2024-05-29 DIAGNOSIS — R9401 Abnormal electroencephalogram [EEG]: Secondary | ICD-10-CM | POA: Diagnosis not present

## 2024-05-29 DIAGNOSIS — I351 Nonrheumatic aortic (valve) insufficiency: Secondary | ICD-10-CM | POA: Diagnosis not present

## 2024-05-29 DIAGNOSIS — G9341 Metabolic encephalopathy: Secondary | ICD-10-CM | POA: Diagnosis not present

## 2024-05-29 DIAGNOSIS — I361 Nonrheumatic tricuspid (valve) insufficiency: Secondary | ICD-10-CM | POA: Diagnosis not present

## 2024-05-29 DIAGNOSIS — R410 Disorientation, unspecified: Secondary | ICD-10-CM | POA: Diagnosis not present

## 2024-05-29 DIAGNOSIS — I358 Other nonrheumatic aortic valve disorders: Secondary | ICD-10-CM | POA: Diagnosis not present

## 2024-05-29 DIAGNOSIS — H5589 Other irregular eye movements: Secondary | ICD-10-CM | POA: Diagnosis not present

## 2024-05-30 DIAGNOSIS — R9401 Abnormal electroencephalogram [EEG]: Secondary | ICD-10-CM | POA: Diagnosis not present

## 2024-05-30 DIAGNOSIS — H5589 Other irregular eye movements: Secondary | ICD-10-CM | POA: Diagnosis not present

## 2024-05-30 DIAGNOSIS — R29898 Other symptoms and signs involving the musculoskeletal system: Secondary | ICD-10-CM | POA: Diagnosis not present

## 2024-05-30 DIAGNOSIS — R410 Disorientation, unspecified: Secondary | ICD-10-CM | POA: Diagnosis not present

## 2024-05-30 DIAGNOSIS — R918 Other nonspecific abnormal finding of lung field: Secondary | ICD-10-CM | POA: Diagnosis not present

## 2024-05-31 DIAGNOSIS — R410 Disorientation, unspecified: Secondary | ICD-10-CM | POA: Diagnosis not present

## 2024-06-01 DIAGNOSIS — M6289 Other specified disorders of muscle: Secondary | ICD-10-CM | POA: Diagnosis not present

## 2024-06-01 DIAGNOSIS — H5589 Other irregular eye movements: Secondary | ICD-10-CM | POA: Diagnosis not present

## 2024-06-01 DIAGNOSIS — R410 Disorientation, unspecified: Secondary | ICD-10-CM | POA: Diagnosis not present

## 2024-06-01 DIAGNOSIS — R9401 Abnormal electroencephalogram [EEG]: Secondary | ICD-10-CM | POA: Diagnosis not present

## 2024-06-02 DIAGNOSIS — R410 Disorientation, unspecified: Secondary | ICD-10-CM | POA: Diagnosis not present

## 2024-06-07 DIAGNOSIS — Z7984 Long term (current) use of oral hypoglycemic drugs: Secondary | ICD-10-CM | POA: Diagnosis not present

## 2024-06-07 DIAGNOSIS — G43909 Migraine, unspecified, not intractable, without status migrainosus: Secondary | ICD-10-CM | POA: Diagnosis not present

## 2024-06-07 DIAGNOSIS — K7581 Nonalcoholic steatohepatitis (NASH): Secondary | ICD-10-CM | POA: Diagnosis not present

## 2024-06-07 DIAGNOSIS — K7460 Unspecified cirrhosis of liver: Secondary | ICD-10-CM | POA: Diagnosis not present

## 2024-06-07 DIAGNOSIS — Z951 Presence of aortocoronary bypass graft: Secondary | ICD-10-CM | POA: Diagnosis not present

## 2024-06-07 DIAGNOSIS — Z7901 Long term (current) use of anticoagulants: Secondary | ICD-10-CM | POA: Diagnosis not present

## 2024-06-07 DIAGNOSIS — I67841 Reversible cerebrovascular vasoconstriction syndrome: Secondary | ICD-10-CM | POA: Diagnosis not present

## 2024-06-07 DIAGNOSIS — J45909 Unspecified asthma, uncomplicated: Secondary | ICD-10-CM | POA: Diagnosis not present

## 2024-06-07 DIAGNOSIS — K3189 Other diseases of stomach and duodenum: Secondary | ICD-10-CM | POA: Diagnosis not present

## 2024-06-07 DIAGNOSIS — Z86711 Personal history of pulmonary embolism: Secondary | ICD-10-CM | POA: Diagnosis not present

## 2024-06-07 DIAGNOSIS — K219 Gastro-esophageal reflux disease without esophagitis: Secondary | ICD-10-CM | POA: Diagnosis not present

## 2024-06-07 DIAGNOSIS — D696 Thrombocytopenia, unspecified: Secondary | ICD-10-CM | POA: Diagnosis not present

## 2024-06-07 DIAGNOSIS — E119 Type 2 diabetes mellitus without complications: Secondary | ICD-10-CM | POA: Diagnosis not present

## 2024-06-07 DIAGNOSIS — I2510 Atherosclerotic heart disease of native coronary artery without angina pectoris: Secondary | ICD-10-CM | POA: Diagnosis not present

## 2024-06-07 DIAGNOSIS — I252 Old myocardial infarction: Secondary | ICD-10-CM | POA: Diagnosis not present

## 2024-06-07 DIAGNOSIS — M5430 Sciatica, unspecified side: Secondary | ICD-10-CM | POA: Diagnosis not present

## 2024-06-07 DIAGNOSIS — I110 Hypertensive heart disease with heart failure: Secondary | ICD-10-CM | POA: Diagnosis not present

## 2024-06-07 DIAGNOSIS — I5032 Chronic diastolic (congestive) heart failure: Secondary | ICD-10-CM | POA: Diagnosis not present

## 2024-06-07 DIAGNOSIS — I69892 Facial weakness following other cerebrovascular disease: Secondary | ICD-10-CM | POA: Diagnosis not present

## 2024-06-07 DIAGNOSIS — E785 Hyperlipidemia, unspecified: Secondary | ICD-10-CM | POA: Diagnosis not present

## 2024-06-08 ENCOUNTER — Inpatient Hospital Stay: Attending: Oncology | Admitting: Oncology

## 2024-06-08 NOTE — Progress Notes (Deleted)
 [facility] [address], [place] [address]    Clinic Day:  06/08/2024  Referring physician: Zarwolo, Gloria, FNP  Patient Care Team: Zarwolo, Gloria, FNP as PCP - General (Family Medicine) Debera Jayson MATSU, MD as PCP - Cardiology (Cardiology) Rogers Hai, MD as Medical Oncologist (Hematology) Ladora Ross Lacy Phebe, MD as Referring Physician (Optometry) Jude Harden GAILS, MD as Consulting Physician (Pulmonary Disease) Pesci, Corin A, FNP (Dermatology) Rogers Hai, MD as Consulting Physician (Hematology) Eartha Angelia Sieving, MD as Consulting Physician (Gastroenterology) Malachy Comer GAILS, NP as Nurse Practitioner (Nurse Practitioner)   ASSESSMENT & PLAN:   Assessment: 1.  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 - Presented to the ER on 12/09/2022 with intermittent midsternal pain radiating to the arm.  Chest x-ray was normal. - Was diagnosed with COVID with home test positive around 12/21/2022 (symptoms fever 1 day and cough still lingering) - Presented to ER again on 12/22/2022 with same symptoms. - Third presentation to the ER on 12/26/2022 with same symptoms. - CT angio chest: Small segmental and subsegmental pulmonary embolus in the right middle lobe with no evidence of right heart strain.  Scattered groundglass opacities in the right lung and at the left lung base, likely infectious or inflammatory.  Hepatic cirrhosis and splenomegaly. - Ultrasound legs (12/27/2022): Left profundofemoral vein DVT.  No evidence of DVT in the right leg. - Chest pain completely resolved after Eliquis  started. - 20 pound weight loss since on [name] .  No fevers or night sweats. - Initially it was thought to be thrombosis related to COVID.  However patient had presentation to the ER with similar symptoms of midsternal pain radiating  to the arm even prior to COVID infection on 12/09/2022.  Because of his sedentary job, it was classified as weakly provoked to unprovoked. - Testing for lupus anticoagulant, anticardiolipin and antibeta 2 glycoprotein antibodies negative.   2.  Social/family history: - Lives at home with his wife.  He has been on disability for 19 years and recently started working as a Electrical engineer at [employer] for the last 1 year.  He is non-smoker.  Nonalcoholic. - No family history of thrombosis.  Mother had 1 miscarriage. - Paternal grandfather had cancer.    Plan: 1.  Unprovoked DVT/PE: - He is tolerating Eliquis  very well.  Denies any bleeding issues or easy bruising. - He is continuing to work as a Electrical engineer, mostly sedentary. - I have recommended continuing anticoagulation at this time as benefits outweigh risks. - Because of his thrombocytopenia, will closely monitor.  RTC 6 months with repeat CBC and risk-benefit analysis to continue anticoagulation.   2.  Mild leukopenia and moderate thrombocytopenia: - He has evidence of cirrhosis with splenomegaly on CT scan. - B12, MMA and folic acid  levels on 01/24/2024 is normal.  White count and platelet count are low but stable at 2.9 and platelet count 66k respectively. - He follows up with GI service for cirrhosis.  3.  Postphlebitic syndrome of the left leg: - He reports on and off swelling of the left leg, likely from post phlebitis syndrome. - Recommend wearing compression socks.  He may try 15-20 mmHg compression socks initially and increase to 20-30 mmHg if still has swelling.  4.     No orders of the defined types were placed in this encounter.  Delon FORBES Hope, NP   7/31/20259:22 AM  CHIEF COMPLAINT:   Diagnosis: DVT/PE    Cancer Staging  No matching staging information was found for the patient.    Prior Therapy: none  Current Therapy:  Eliquis    HISTORY OF PRESENT ILLNESS:   Oncology History   No history  exists.     INTERVAL HISTORY:   [name] [name] is a 68 y.o. male presenting to clinic today for follow up of DVT/PE. He was last seen in clinic on 01/30/2024 by Dr. Rogers.  Since that time, he has had multiple hospitalizations.  He was evaluated on 03/31/2024 for weakness and stroke rule out.  MRI of his brain did not reveal evidence of an acute intracranial abnormality.  He was evaluated on 05/27/2024 for altered mental status and left facial droop.  He also had several episodes of emesis and was found to have an abdominal infection.  He was started on IV antibiotics.  He developed severe agitation and had to be sedated intubated.  After extensive workup, his altered mental status was ruled reversible cerebral vasoconstriction syndrome due to an elevation in his blood pressure.   Today, he states that he is doing well overall. His appetite level is at 100%. His energy level is at 100%.  PAST MEDICAL HISTORY:   Past Medical History: Past Medical History:  Diagnosis Date   Anxiety    Aortic insufficiency    Arthritis    Asthma    Cataract    Right eye   Chronic lower back pain    Cirrhosis of liver (HCC)    Coronary atherosclerosis of native coronary artery    a. 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   LIMA to LAD, free RIMA to OM2, SVG to PDA, open SVG harvest from right thigh   Stroke (HCC)    Thrombocytopenia (HCC)    TIA (transient ischemic attack)    Type 2 diabetes mellitus (HCC)     Surgical History: Past Surgical History:  Procedure Laterality Date   ANTERIOR CERVICAL DECOMP/DISCECTOMY FUSION  1998    C3-4   CARDIAC CATHETERIZATION  several   CARDIAC CATHETERIZATION N/A 12/30/2015   Procedure: Left Heart Cath and Coronary Angiography;  Surgeon: Dorn JINNY Lesches, MD;  Location[facility] INVASIVE CV LAB;  Service: Cardiovascular;  Laterality: N/A;   CARPAL TUNNEL RELEASE Bilateral 2005   CHOLECYSTECTOMY N/A 11/28/2018   Procedure: LAPAROSCOPIC CHOLECYSTECTOMY;  Surgeon: Kallie Manuelita BROCKS, MD;  Location: [facility] ORS;  Service: General;  Laterality: N/A;   COLONOSCOPY     COLONOSCOPY WITH PROPOFOL  N/A 09/27/2020   Procedure: COLONOSCOPY WITH PROPOFOL ;  Surgeon: Eartha Angelia Sieving, MD;  Location: [facility] ENDO SUITE;  Service: Gastroenterology;  Laterality: N/A;   COLONOSCOPY WITH PROPOFOL  N/A 06/29/2023   Procedure: COLONOSCOPY WITH PROPOFOL ;  Surgeon: Eartha Angelia Sieving, MD;  Location: [facility] ENDO SUITE;  Service: Gastroenterology;  Laterality: N/A;  1:30PM;ASA 3   CORONARY ANGIOPLASTY     CORONARY ANGIOPLASTY WITH STENT PLACEMENT  2002; 2003; 11/20/2014  I have 4 stents after today (11/20/2014)   CORONARY ARTERY BYPASS GRAFT N/A 01/09/2016   Procedure: CORONARY ARTERY BYPASS GRAFTING (CABG) X 3 UTILIZING RIGHT AND LEFT INTERNAL MAMMARY ARTERY AND ENDOSCOPICALLY HARVESTED SAPHENEOUS VEIN.;  Surgeon: Sudie VEAR Laine, MD;  Location: [facility] OR;  Service: Open Heart Surgery;  Laterality: N/A;   ESOPHAGOGASTRODUODENOSCOPY     ESOPHAGOGASTRODUODENOSCOPY (EGD) WITH PROPOFOL  N/A 09/27/2020   Procedure: ESOPHAGOGASTRODUODENOSCOPY (EGD) WITH PROPOFOL ;  Surgeon: Eartha Angelia Sieving, MD;  Location: [facility] END[facility] SUITE;  Service: Gastroenterology;  Laterality: N/A;  10   ESOPHAGOGASTRODUODENOSCOPY (EGD) WITH PROPOFOL  N/A 06/29/2023   Procedure: ESOPHAGOGASTRODUODENOSCOPY (EGD) WITH PROPOFOL ;  Surgeon: Eartha Angelia Sieving, MD;  Location: [facility] END[facility] SU[facility];  Service: Gastroenterology;  Laterality: N/A;  1:30PM;ASA 3   KNEE SURGERY Left 02/2012   scraped; open   LEFT HEART CATH AND CORS/GRAFTS ANGIOGRAPHY N/A  08/09/2017   Procedure: LEFT HEART CATH AND CORS/GRAFTS ANGIOGRAPHY;  Surgeon: Mady Bruckner, MD;  Location: [facility] INVASIVE CV LAB;  Service: Cardiovascular;  Laterality: N/A;   LEFT HEART CATH AND CORS/GRAFTS ANGIOGRAPHY N/A 11/22/2018   Procedure: LEFT HEART CATH AND CORS/GRAFTS ANGIOGRAPHY;  Surgeon: Swaziland, [name], MD;  Location: [facility] INVASIVE CV LAB;  Service: Cardiovascular;  Laterality: N/A;   LEFT HEART CATH AND CORS/GRAFTS ANGIOGRAPHY N/A 08/20/2022   Procedure: LEFT HEART CATH AND CORS/GRAFTS ANGIOGRAPHY;  Surgeon: Verlin Bruckner BIRCH, MD;  Location: [facility] INVASIVE CV LAB;  Service: Cardiovascular;  Laterality: N/A;   LEFT HEART CATHETERIZATION WITH CORONARY ANGIOGRAM N/A 07/20/2012   Procedure: LEFT HEART CATHETERIZATION WITH CORONARY ANGIOGRAM;  Surgeon: Deatrice DELENA Cage, MD;  Location: [facility] CATH LAB;  Service: Cardiovascular;  Laterality: N/A;   LEFT HEART CATHETERIZATION WITH CORONARY ANGIOGRAM N/A 11/20/2014   Procedure: LEFT HEART CATHETERIZATION WITH CORONARY ANGIOGRAM;  Surgeon: [name] Swaziland, MD;  Location: [facility] CATH LAB;  Service: Cardiovascular;  Laterality: N/A;   LEFT HEART CATHETERIZATION WITH CORONARY ANGIOGRAM N/A 11/26/2014   Procedure: LEFT HEART CATHETERIZATION WITH CORONARY ANGIOGRAM;  Surgeon: [name] Swaziland, MD;  Location: [facility] CATH LAB;  Service: Cardiovascular;  Laterality: N/A;   NEUROPLASTY / TRANSPOSITION ULNAR NERVE AT ELBOW Right ~ 2012   PERCUTANEOUS CORONARY ROTOBLATOR INTERVENTION (PCI-R)  11/20/2014   Procedure: PERCUTANEOUS CORONARY ROTOBLATOR INTERVENTION (PCI-R);  Surgeon: [name] Swaziland, MD;  Location: [facility] CATH LAB;  Service: Cardiovascular;;   POSTERIOR CERVICAL LAMINECTOMY Left 04/16/2022   Procedure: Laminectomy and Foraminotomy - left - C6-C7;  Surgeon: Louis Shove, MD;  Location: [facility] OR;  Service: Neurosurgery;  Laterality: Left;  3C   SHOULDER ARTHROSCOPY Left ~ 2011   TEE WITHOUT CARDIOVERSION N/A 01/09/2016   Procedure: TRANSESOPHAGEAL ECHOCARDIOGRAM (TEE);   Surgeon: Sudie VEAR Laine, MD;  Location: [facility] OR;  Service: Open Heart Surgery;  Laterality: N/A;    Social History: Social History   Socioeconomic History   Marital status: Married    Spouse name: [name]   Number of children: 3   Years of education: 12 th    Highest education level: Not on file  Occupational History   Occupation: Unemployed  Tobacco Use   Smoking status: Never    Passive exposure: Current   Smokeless tobacco: Never  Vaping Use   Vaping status: Never Used  Substance and Sexual Activity   Alcohol use: No   Drug use: No   Sexual activity: Not on file  Other Topics Concern   Not on file  Social History Narrative   Patient lives at home with wife [name].   Patient  works at [employer], Social research officer, government.   Patient has a 12 th grade education.    Patient has 3 children.       Updated 11/30/2023:   Works at [employer] as a Engineer, materials      Social Drivers of Corporate investment banker Strain: Low Risk  (11/30/2023)   Overall Financial Resource Strain (CARDIA)    Difficulty of Paying Living Expenses: Not hard at all  Food Insecurity: Low Risk  (05/28/2024)   Received from [facility]   Hunger Vital Sign    Within the past 12 months, you worried that your food would run out before you got money to buy more: Never true    Within the past 12 months, the food you bought just didn't last and you didn't have money to get more. : Never true  Transportation Needs: No Transportation Needs (05/28/2024)   Received from [facility]    In the past 12 months, has lack of reliable transportation kept you from medical appointments, meetings, work or from getting things needed for daily living? : No  Physical Activity: Sufficiently Active (11/30/2023)   Exercise Vital Sign    Days of Exercise per Week: 3 days    Minutes of Exercise per Session: 60 min  Stress: No Stress Concern Present (11/30/2023)   [employer] of Occupational Health -  Occupational Stress Questionnaire    Feeling of Stress : Not at all  Social Connections: Socially Integrated (11/30/2023)   Social Connection and Isolation Panel    Frequency of Communication with Friends and Family: More than three times a week    Frequency of Social Gatherings with Friends and Family: More than three times a week    Attends Religious Services: More than 4 times per year    Active Member of [employer] or Organizations: Yes    Attends Banker Meetings: More than 4 times per year    Marital Status: Married  Catering manager Violence: Not At Risk (11/30/2023)   Humiliation, Afraid, Rape, and Kick questionnaire    Fear of Current or Ex-Partner: No    Emotionally Abused: No    Physically Abused: No    Sexually Abused: No    Family History: Family History  Problem Relation Age of Onset   Stroke Mother    Coronary artery disease Father 25   Asthma Sister    Multiple sclerosis Brother    Heart disease Paternal Uncle    Stroke Maternal Grandmother    Heart attack Paternal Grandmother    Cancer Paternal Grandfather    Asthma Sister    Seizures Son    Migraines Son    Autism Son    Migraines Son     Current Medications:  Current Outpatient Medications:    albuterol  (VENTOLIN  HFA) 108 (90 Base) MCG/ACT inhaler, Inhale 2 puffs into the lungs every 6 (six) hours as needed for wheezing or shortness of breath., Disp: , Rfl:    carvedilol  (COREG ) 6.25 MG tablet, Take 1 tablet (6.25 mg total) by mouth 2 (two) times daily with a meal., Disp: 180 tablet, Rfl: 3   citalopram  (CELEXA ) 20 MG tablet, TAKE 1 TABLET BY MOUTH AT BEDTIME., Disp: 30 tablet, Rfl: 10   Continuous Glucose Receiver (FREESTYLE LIBRE 2 READER) DEVI, 1 Device by Does not apply route continuous., Disp: 1 each, Rfl: 0   Continuous Glucose Sensor (FREESTYLE LIBRE 2 SENSOR) MISC, USE 1 EACH FOR 14 DAYS, Disp: 2 each,  Rfl: 10   cyclobenzaprine  (FLEXERIL ) 10 MG tablet, TAKE 1 TABLET BY MOUTH 3 TIMES DAILY  AS NEEDED FOR MUSCLE SPASMS, Disp: 30 tablet, Rfl: 10   ELIQUIS  5 MG TABS tablet, TAKE 1 TABLET BY MOUTH TWICE DAILY, Disp: 60 tablet, Rfl: 10   ezetimibe  (ZETIA ) 10 MG tablet, TAKE 1 TABLET BY MOUTH ONCE DAILY, Disp: 30 tablet, Rfl: 11   FARXIGA  5 MG TABS tablet, TAKE 1 TABLET BY MOUTH DAILY BEFORE BREAKFAST, Disp: 30 tablet, Rfl: 10   fluticasone  (FLONASE ) 50 MCG/ACT nasal spray, PLACE 2 SPRAYS INTO BOTH NOSTRILS 2 TIMES DAILY., Disp: 16 g, Rfl: 11   fluticasone -salmeterol (ADVAIR ) 250-50 MCG/ACT AEPB, INHALE 1 PUFF BY MOUTH TWICE DAILY, Disp: 60 each, Rfl: 11   gabapentin  (NEURONTIN ) 300 MG capsule, TAKE 1 CAPSULE BY MOUTH THREE TIMES DAILY, Disp: 90 capsule, Rfl: 11   isosorbide  mononitrate (IMDUR ) 60 MG 24 hr tablet, TAKE 1 TABLET (60MG ) BY MOUTH IN THE MORNING AND 1/2 TABLET (30MG ) IN THE EVENING, Disp: 45 tablet, Rfl: 10   Melatonin 10 MG TABS, Take 10 mg by mouth at bedtime., Disp: , Rfl:    nitroGLYCERIN  (NITROSTAT ) 0.4 MG SL tablet, DISSOLVE 1 TABLET UNDER THE TONGUE AS NEEDED FOR CHEST PAIN EVERY 5 MINUTES UP TO 3 TIMES. IF NO RELIEF CALL 911., Disp: 25 tablet, Rfl: 11   omeprazole  (PRILOSEC) 40 MG capsule, TAKE 1 CAPSULE BY MOUTH DAILY., Disp: 30 capsule, Rfl: 11   pravastatin  (PRAVACHOL ) 20 MG tablet, TAKE 1 TABLET BY MOUTH DAILY., Disp: 30 tablet, Rfl: 10   tirzepatide  ([name] ) 7.5 MG/0.5ML Pen, Inject 7.5 mg into the skin once a week., Disp: 6 mL, Rfl: 1   topiramate  (TOPAMAX ) 50 MG tablet, TAKE 1 TABLET BY MOUTH TWICE DAILY, Disp: 180 tablet, Rfl: 11   zonisamide  (ZONEGRAN ) 100 MG capsule, Take 100 mg by mouth daily., Disp: , Rfl:    Allergies: Allergies  Allergen Reactions   Divalproex Sodium Other (See Comments)    Causes anger   Statins Other (See Comments)    Muscle aches and cramps   Tramadol  Other (See Comments)    Chest pain    Valproic Acid Other (See Comments)    Causes anger   Gadolinium Derivatives Nausea And Vomiting    07/25/19 Pt vomited immediately after IV  gad. Denies itching, dyspnea.  (Adverse, not allergic, reaction   Tricor  [Fenofibrate ] Other (See Comments)    Leg cramps    REVIEW OF SYSTEMS:   Review of Systems  Constitutional:  Negative for chills, fatigue and fever.  HENT:   Negative for lump/mass, mouth sores, nosebleeds, sore throat and trouble swallowing.   Eyes:  Negative for eye problems.  Respiratory:  Negative for cough and shortness of breath.   Cardiovascular:  Negative for chest pain, leg swelling and palpitations.  Gastrointestinal:  Positive for diarrhea. Negative for abdominal pain, constipation, nausea and vomiting.  Genitourinary:  Negative for bladder incontinence, difficulty urinating, dysuria, frequency, hematuria and nocturia.   Musculoskeletal:  Negative for arthralgias, back pain, flank pain, myalgias and neck pain.  Skin:  Negative for itching and rash.  Neurological:  Positive for dizziness. Negative for headaches and numbness.  Hematological:  Does not bruise/bleed easily.  Psychiatric/Behavioral:  Positive for sleep disturbance. Negative for depression and suicidal ideas. The patient is not nervous/anxious.   All other systems reviewed and are negative.    VITALS:   There were no vitals taken for this visit.  Wt Readings from Last 3 Encounters:  04/07/24 204 lb 12.8 oz (92.9 kg)  03/31/24 203 lb (92.1 kg)  03/07/24 208 lb 12.8 oz (94.7 kg)    There is no height or weight on file to calculate BMI.  Performance status (ECOG): 1 - Symptomatic but completely ambulatory  PHYSICAL EXAM:   Physical Exam Vitals and nursing note reviewed. Exam conducted with a chaperone present.  Constitutional:      Appearance: Normal appearance.  Cardiovascular:     Rate and Rhythm: Normal rate and regular rhythm.     Pulses: Normal pulses.     Heart sounds: Normal heart sounds.  Pulmonary:     Effort: Pulmonary effort is normal.     Breath sounds: Normal breath sounds.  Abdominal:     Palpations: Abdomen is  soft. There is no hepatomegaly, splenomegaly or mass.     Tenderness: There is no abdominal tenderness.  Musculoskeletal:     Right lower leg: No edema.     Left lower leg: No edema.  Lymphadenopathy:     Cervical: No cervical adenopathy.     Right cervical: No superficial, deep or posterior cervical adenopathy.    Left cervical: No superficial, deep or posterior cervical adenopathy.     Upper Body:     Right upper body: No supraclavicular or axillary adenopathy.     Left upper body: No supraclavicular or axillary adenopathy.  Neurological:     General: No focal deficit present.     Mental Status: He is alert and oriented to person, place, and time.  Psychiatric:        Mood and Affect: Mood normal.        Behavior: Behavior normal.     LABS:   CBC     Component Value Date/Time   WBC 3.3 (L) 04/07/2024 1029   WBC 2.1 (L) 03/31/2024 1718   RBC 5.07 04/07/2024 1029   RBC 4.44 03/31/2024 1718   HGB 16.1 04/07/2024 1029   HCT 47.0 04/07/2024 1029   PLT 71 (LL) 04/07/2024 1029   MCV 93 04/07/2024 1029   MCH 31.8 04/07/2024 1029   MCH 32.4 03/31/2024 1718   MCHC 34.3 04/07/2024 1029   MCHC 36.6 (H) 03/31/2024 1718   RDW 13.6 04/07/2024 1029   LYMPHSABS 0.7 04/07/2024 1029   MONOABS 0.3 03/31/2024 1718   EOSABS 0.1 04/07/2024 1029   BASOSABS 0.0 04/07/2024 1029    CMP      Component Value Date/Time   NA 141 04/07/2024 1029   K 4.1 04/07/2024 1029   CL 105 04/07/2024 1029   CO2 19 (L) 04/07/2024 1029   GLUCOSE 115 (H) 04/07/2024 1029   GLUCOSE 121 (H) 03/31/2024 1718   BUN 18 04/07/2024 1029   CREATININE 1.09 04/07/2024 1029   CREATININE 1.04 08/15/2020 1451   CALCIUM  9.2 04/07/2024 1029   PROT 6.1 (L) 03/31/2024 1718   PROT 6.7 02/15/2024 1332   ALBUMIN  3.2 (L) 03/31/2024 1718   ALBUMIN  3.9 02/15/2024 1332   AST 30 03/31/2024 1718   ALT 29 03/31/2024 1718   ALKPHOS 48 03/31/2024 1718   BILITOT 0.9 03/31/2024 1718   BILITOT 0.5 02/15/2024 1332   GFRNONAA  >60 03/31/2024 1718   GFRAA >60 07/27/2020 0525     No results found for: CEA1, CEA / No results found for: CEA1, CEA No results found for: PSA1 No results found for: CAN199 No results found for: RJW874  Lab Results  Component Value Date   TOTALPROTELP 7.8 12/26/2018   ALBUMINELP 3.1  12/26/2018   A1GS 0.3 12/26/2018   A2GS 1.0 12/26/2018   BETS 1.6 (H) 12/26/2018   GAMS 1.8 12/26/2018   MSPIKE Not Observed 12/26/2018   SPEI Comment 12/26/2018   Lab Results  Component Value Date   TIBC 388 08/20/2021   TIBC 402 02/18/2021   TIBC 374 05/31/2020   FERRITIN 27 08/20/2021   FERRITIN 36 02/18/2021   FERRITIN 100 05/31/2020   IRONPCTSAT 29 08/20/2021   IRONPCTSAT 25 02/18/2021   IRONPCTSAT 27 05/31/2020   Lab Results  Component Value Date   LDH 157 02/18/2021   LDH 138 08/30/2019     STUDIES:   No results found.

## 2024-06-09 DIAGNOSIS — M79632 Pain in left forearm: Secondary | ICD-10-CM | POA: Diagnosis not present

## 2024-06-09 DIAGNOSIS — S5012XA Contusion of left forearm, initial encounter: Secondary | ICD-10-CM | POA: Diagnosis not present

## 2024-06-14 DIAGNOSIS — Z7984 Long term (current) use of oral hypoglycemic drugs: Secondary | ICD-10-CM | POA: Diagnosis not present

## 2024-06-14 DIAGNOSIS — I69892 Facial weakness following other cerebrovascular disease: Secondary | ICD-10-CM | POA: Diagnosis not present

## 2024-06-14 DIAGNOSIS — Z7901 Long term (current) use of anticoagulants: Secondary | ICD-10-CM | POA: Diagnosis not present

## 2024-06-14 DIAGNOSIS — K3189 Other diseases of stomach and duodenum: Secondary | ICD-10-CM | POA: Diagnosis not present

## 2024-06-14 DIAGNOSIS — I67841 Reversible cerebrovascular vasoconstriction syndrome: Secondary | ICD-10-CM | POA: Diagnosis not present

## 2024-06-14 DIAGNOSIS — I252 Old myocardial infarction: Secondary | ICD-10-CM | POA: Diagnosis not present

## 2024-06-14 DIAGNOSIS — D696 Thrombocytopenia, unspecified: Secondary | ICD-10-CM | POA: Diagnosis not present

## 2024-06-14 DIAGNOSIS — J45909 Unspecified asthma, uncomplicated: Secondary | ICD-10-CM | POA: Diagnosis not present

## 2024-06-14 DIAGNOSIS — M5430 Sciatica, unspecified side: Secondary | ICD-10-CM | POA: Diagnosis not present

## 2024-06-14 DIAGNOSIS — I5032 Chronic diastolic (congestive) heart failure: Secondary | ICD-10-CM | POA: Diagnosis not present

## 2024-06-14 DIAGNOSIS — K219 Gastro-esophageal reflux disease without esophagitis: Secondary | ICD-10-CM | POA: Diagnosis not present

## 2024-06-14 DIAGNOSIS — Z951 Presence of aortocoronary bypass graft: Secondary | ICD-10-CM | POA: Diagnosis not present

## 2024-06-14 DIAGNOSIS — I2510 Atherosclerotic heart disease of native coronary artery without angina pectoris: Secondary | ICD-10-CM | POA: Diagnosis not present

## 2024-06-14 DIAGNOSIS — K7581 Nonalcoholic steatohepatitis (NASH): Secondary | ICD-10-CM | POA: Diagnosis not present

## 2024-06-14 DIAGNOSIS — I110 Hypertensive heart disease with heart failure: Secondary | ICD-10-CM | POA: Diagnosis not present

## 2024-06-14 DIAGNOSIS — K7460 Unspecified cirrhosis of liver: Secondary | ICD-10-CM | POA: Diagnosis not present

## 2024-06-14 DIAGNOSIS — G43909 Migraine, unspecified, not intractable, without status migrainosus: Secondary | ICD-10-CM | POA: Diagnosis not present

## 2024-06-14 DIAGNOSIS — Z86711 Personal history of pulmonary embolism: Secondary | ICD-10-CM | POA: Diagnosis not present

## 2024-06-14 DIAGNOSIS — E119 Type 2 diabetes mellitus without complications: Secondary | ICD-10-CM | POA: Diagnosis not present

## 2024-06-14 DIAGNOSIS — E785 Hyperlipidemia, unspecified: Secondary | ICD-10-CM | POA: Diagnosis not present

## 2024-06-16 ENCOUNTER — Encounter: Payer: Self-pay | Admitting: Family Medicine

## 2024-06-16 ENCOUNTER — Ambulatory Visit (INDEPENDENT_AMBULATORY_CARE_PROVIDER_SITE_OTHER): Payer: Self-pay | Admitting: Family Medicine

## 2024-06-16 VITALS — BP 115/64 | HR 60 | Resp 18 | Ht 69.0 in | Wt 199.1 lb

## 2024-06-16 DIAGNOSIS — Z7984 Long term (current) use of oral hypoglycemic drugs: Secondary | ICD-10-CM | POA: Diagnosis not present

## 2024-06-16 DIAGNOSIS — Z09 Encounter for follow-up examination after completed treatment for conditions other than malignant neoplasm: Secondary | ICD-10-CM | POA: Diagnosis not present

## 2024-06-16 DIAGNOSIS — G9341 Metabolic encephalopathy: Secondary | ICD-10-CM | POA: Diagnosis not present

## 2024-06-16 DIAGNOSIS — Z86711 Personal history of pulmonary embolism: Secondary | ICD-10-CM | POA: Diagnosis not present

## 2024-06-16 DIAGNOSIS — I2510 Atherosclerotic heart disease of native coronary artery without angina pectoris: Secondary | ICD-10-CM | POA: Diagnosis not present

## 2024-06-16 DIAGNOSIS — I10 Essential (primary) hypertension: Secondary | ICD-10-CM

## 2024-06-16 DIAGNOSIS — E1169 Type 2 diabetes mellitus with other specified complication: Secondary | ICD-10-CM

## 2024-06-16 DIAGNOSIS — K219 Gastro-esophageal reflux disease without esophagitis: Secondary | ICD-10-CM | POA: Diagnosis not present

## 2024-06-16 DIAGNOSIS — D696 Thrombocytopenia, unspecified: Secondary | ICD-10-CM | POA: Diagnosis not present

## 2024-06-16 DIAGNOSIS — K7581 Nonalcoholic steatohepatitis (NASH): Secondary | ICD-10-CM | POA: Diagnosis not present

## 2024-06-16 DIAGNOSIS — K3189 Other diseases of stomach and duodenum: Secondary | ICD-10-CM | POA: Diagnosis not present

## 2024-06-16 DIAGNOSIS — E119 Type 2 diabetes mellitus without complications: Secondary | ICD-10-CM | POA: Diagnosis not present

## 2024-06-16 DIAGNOSIS — J45909 Unspecified asthma, uncomplicated: Secondary | ICD-10-CM | POA: Diagnosis not present

## 2024-06-16 DIAGNOSIS — G43909 Migraine, unspecified, not intractable, without status migrainosus: Secondary | ICD-10-CM | POA: Diagnosis not present

## 2024-06-16 DIAGNOSIS — I252 Old myocardial infarction: Secondary | ICD-10-CM | POA: Diagnosis not present

## 2024-06-16 DIAGNOSIS — Z7901 Long term (current) use of anticoagulants: Secondary | ICD-10-CM | POA: Diagnosis not present

## 2024-06-16 DIAGNOSIS — I110 Hypertensive heart disease with heart failure: Secondary | ICD-10-CM | POA: Diagnosis not present

## 2024-06-16 DIAGNOSIS — Z951 Presence of aortocoronary bypass graft: Secondary | ICD-10-CM | POA: Diagnosis not present

## 2024-06-16 DIAGNOSIS — I69892 Facial weakness following other cerebrovascular disease: Secondary | ICD-10-CM | POA: Diagnosis not present

## 2024-06-16 DIAGNOSIS — K7460 Unspecified cirrhosis of liver: Secondary | ICD-10-CM | POA: Diagnosis not present

## 2024-06-16 DIAGNOSIS — I5032 Chronic diastolic (congestive) heart failure: Secondary | ICD-10-CM | POA: Diagnosis not present

## 2024-06-16 DIAGNOSIS — M5430 Sciatica, unspecified side: Secondary | ICD-10-CM | POA: Diagnosis not present

## 2024-06-16 DIAGNOSIS — I67841 Reversible cerebrovascular vasoconstriction syndrome: Secondary | ICD-10-CM | POA: Diagnosis not present

## 2024-06-16 DIAGNOSIS — E785 Hyperlipidemia, unspecified: Secondary | ICD-10-CM | POA: Diagnosis not present

## 2024-06-16 NOTE — Patient Instructions (Addendum)
 F/U with PCP as before  Blood pressure at visit today is good, please continue to take what you are taking, take ALL medications to your Cardiology appointment and your Cardiologist will determine what he wants you to be on   STOP cyclobenzaprine , fenofibrate  and percocet  [name]  is not being renewed   Nurse please type and provide patient with a letter for his job stating that he is unable to return to work at this time because of his health  Careful with steps , avoid as much as possible so no fall!  Upcoming appointments, Neurology, [facility] Hematology , [facility] Cardiology, [place]  Thanks for choosing [facility], we consider it a privelige to serve you.

## 2024-06-19 ENCOUNTER — Encounter: Payer: Self-pay | Admitting: Family Medicine

## 2024-06-19 ENCOUNTER — Ambulatory Visit: Payer: Self-pay

## 2024-06-19 DIAGNOSIS — I110 Hypertensive heart disease with heart failure: Secondary | ICD-10-CM | POA: Diagnosis not present

## 2024-06-19 DIAGNOSIS — M5430 Sciatica, unspecified side: Secondary | ICD-10-CM | POA: Diagnosis not present

## 2024-06-19 DIAGNOSIS — D696 Thrombocytopenia, unspecified: Secondary | ICD-10-CM | POA: Diagnosis not present

## 2024-06-19 DIAGNOSIS — I69892 Facial weakness following other cerebrovascular disease: Secondary | ICD-10-CM | POA: Diagnosis not present

## 2024-06-19 DIAGNOSIS — K7581 Nonalcoholic steatohepatitis (NASH): Secondary | ICD-10-CM | POA: Diagnosis not present

## 2024-06-19 DIAGNOSIS — Z951 Presence of aortocoronary bypass graft: Secondary | ICD-10-CM | POA: Diagnosis not present

## 2024-06-19 DIAGNOSIS — I5032 Chronic diastolic (congestive) heart failure: Secondary | ICD-10-CM | POA: Diagnosis not present

## 2024-06-19 DIAGNOSIS — K219 Gastro-esophageal reflux disease without esophagitis: Secondary | ICD-10-CM | POA: Diagnosis not present

## 2024-06-19 DIAGNOSIS — G43909 Migraine, unspecified, not intractable, without status migrainosus: Secondary | ICD-10-CM | POA: Diagnosis not present

## 2024-06-19 DIAGNOSIS — J45909 Unspecified asthma, uncomplicated: Secondary | ICD-10-CM | POA: Diagnosis not present

## 2024-06-19 DIAGNOSIS — E119 Type 2 diabetes mellitus without complications: Secondary | ICD-10-CM | POA: Diagnosis not present

## 2024-06-19 DIAGNOSIS — E785 Hyperlipidemia, unspecified: Secondary | ICD-10-CM | POA: Diagnosis not present

## 2024-06-19 DIAGNOSIS — Z7901 Long term (current) use of anticoagulants: Secondary | ICD-10-CM | POA: Diagnosis not present

## 2024-06-19 DIAGNOSIS — Z7984 Long term (current) use of oral hypoglycemic drugs: Secondary | ICD-10-CM | POA: Diagnosis not present

## 2024-06-19 DIAGNOSIS — I252 Old myocardial infarction: Secondary | ICD-10-CM | POA: Diagnosis not present

## 2024-06-19 DIAGNOSIS — Z86711 Personal history of pulmonary embolism: Secondary | ICD-10-CM | POA: Diagnosis not present

## 2024-06-19 DIAGNOSIS — I67841 Reversible cerebrovascular vasoconstriction syndrome: Secondary | ICD-10-CM | POA: Diagnosis not present

## 2024-06-19 DIAGNOSIS — K3189 Other diseases of stomach and duodenum: Secondary | ICD-10-CM | POA: Diagnosis not present

## 2024-06-19 DIAGNOSIS — I2510 Atherosclerotic heart disease of native coronary artery without angina pectoris: Secondary | ICD-10-CM | POA: Diagnosis not present

## 2024-06-19 DIAGNOSIS — K7460 Unspecified cirrhosis of liver: Secondary | ICD-10-CM | POA: Diagnosis not present

## 2024-06-19 NOTE — Assessment & Plan Note (Signed)
Patient in for follow up of recent hospitalization. Discharge summary, and laboratory and radiology data are reviewed, and any questions or concerns  are discussed. Specific issues requiring follow up are specifically addressed.  

## 2024-06-19 NOTE — Progress Notes (Signed)
 [name] [name]     MRN: [id number]      DOB: 04/20/1956  Chief Complaint  Patient presents with   Hospitalization Follow-up    Hosp f/u. Admitted 07/19. Still having some weakness and low bp at home    HPI [name] [name] is here for follow up of hospitalization from 7/19 to 06/02/2024 discharge diagnosis of RCVS, encephalopathy, gastric emphysema , and nausea and vomiting Unable to accurately reconcile meds AS does not have nmeds at visit , there appears to be discrepancy with antihypertensive med recommendation at d/c versus what pt is taking  ROS Denies recent fever or chills. C/o weakness and imbalance especially going down stairs and at his home hen needs to navigate stairs to get a shower, unfortunmately Denies sinus pressure, nasal congestion, ear pain or sore throat. Denies chest congestion, productive cough or wheezing. Denies chest pains, palpitations and leg swelling . Denies skin break down or rash.   PE  BP 115/64   Pulse 60   Resp 18   Ht 5' 9 (1.753 m)   Wt 199 lb 1.9 oz (90.3 kg)   SpO2 94%   BMI 29.40 kg/m   Patient alert and oriented and in no cardiopulmonary distress.  HEENT: No facial asymmetry, EOMI,     Neck supple .  Chest: Clear to auscultation bilaterally. Decreased air entry  CVS: S1, S2 systolic  murmurs, no S3.Regular rate.  ABD: Soft non tender.   Ext: No edema  MS: decreased ROM spine, shoulders, hips and knees.  Skin: Intact, no ulcerations or rash noted.  Psych: Good eye contact, normal affect. Memory intact mildly  anxious not  depressed appearing.  CNS: CN 2-12 intact, power,  normal throughout.no focal deficits noted.   Assessment & Plan  Essential hypertension, benign Currently controlled on medication raking HOWEVER Cardiology to review next week and make final determination meds recommended T to take meds to visit  Hospital discharge follow-up Patient in for follow up of recent hospitalization. Discharge summary, and  laboratory and radiology data are reviewed, and any questions or concerns  are discussed. Specific issues requiring follow up are specifically addressed.   Acute metabolic encephalopathy Recurrent episode requiring hospitalization in 05/2024, will f/u with Neurology , Baptsit, Cardiology, and hematology appt to determine underlying etiology for recurrent embolism  Type 2 diabetes mellitus with other specified complication (HCC) Diabetes associated with hypertension, hyperlipidemia, and neurological disease  [name] [name] is reminded of the importance of commitment to daily physical activity for 30 minutes or more, as able and the need to limit carbohydrate intake to 30 to 60 grams per meal to help with blood sugar control.   The need to take medication as prescribed, test blood sugar as directed, and to call between visits if there is a concern that blood sugar is uncontrolled is also discussed.   [name] [name] is reminded of the importance of daily foot exam, annual eye examination, and good blood sugar, blood pressure and cholesterol control.     Latest Ref Rng & Units 04/07/2024   10:29 AM 03/31/2024    5:18 PM 02/15/2024    1:32 PM 10/17/2023    1:44 PM 08/17/2023    2:37 PM  Diabetic Labs  HbA1c 4.8 - 5.6 %   7.6   5.8   Micro/Creat Ratio 0 - 29 mg/g creat   206     Chol 100 - 199 mg/dL   845     HDL >60 mg/dL   37  Calc LDL 0 - 99 mg/dL   91     Triglycerides 0 - 149 mg/dL   854     Creatinine 9.23 - 1.27 mg/dL 8.90  8.72  8.81  8.99        06/16/2024    1:05 PM 04/07/2024    9:49 AM 03/31/2024    9:00 PM 03/31/2024    8:45 PM 03/31/2024    8:30 PM 03/31/2024    8:15 PM 03/31/2024    8:00 PM  BP/Weight  Systolic BP 115 102 120 116 117 123 116  Diastolic BP 64 65 74 63 65 62 63  Wt. (Lbs) 199.12 204.8       BMI 29.4 kg/m2 30.24 kg/m2           Latest Ref Rng & Units 07/22/2023    2:57 PM  Foot/eye exam completion dates  Eye Exam No Retinopathy No Retinopathy         This  result is from an external source.      Not at [name], pt to d/c [name]  due to gastric emphysema during recent hospitaliuzation felt to be triggered/ cause by Mounjarpo

## 2024-06-19 NOTE — Assessment & Plan Note (Addendum)
 Diabetes associated with hypertension, hyperlipidemia, and neurological disease  [name] [name] is reminded of the importance of commitment to daily physical activity for 30 minutes or more, as able and the need to limit carbohydrate intake to 30 to 60 grams per meal to help with blood sugar control.   The need to take medication as prescribed, test blood sugar as directed, and to call between visits if there is a concern that blood sugar is uncontrolled is also discussed.   [name] [name] is reminded of the importance of daily foot exam, annual eye examination, and good blood sugar, blood pressure and cholesterol control.     Latest Ref Rng & Units 04/07/2024   10:29 AM 03/31/2024    5:18 PM 02/15/2024    1:32 PM 10/17/2023    1:44 PM 08/17/2023    2:37 PM  Diabetic Labs  HbA1c 4.8 - 5.6 %   7.6   5.8   Micro/Creat Ratio 0 - 29 mg/g creat   206     Chol 100 - 199 mg/dL   845     HDL >60 mg/dL   37     Calc LDL 0 - 99 mg/dL   91     Triglycerides 0 - 149 mg/dL   854     Creatinine 9.23 - 1.27 mg/dL 8.90  8.72  8.81  8.99        06/16/2024    1:05 PM 04/07/2024    9:49 AM 03/31/2024    9:00 PM 03/31/2024    8:45 PM 03/31/2024    8:30 PM 03/31/2024    8:15 PM 03/31/2024    8:00 PM  BP/Weight  Systolic BP 115 102 120 116 117 123 116  Diastolic BP 64 65 74 63 65 62 63  Wt. (Lbs) 199.12 204.8       BMI 29.4 kg/m2 30.24 kg/m2           Latest Ref Rng & Units 07/22/2023    2:57 PM  Foot/eye exam completion dates  Eye Exam No Retinopathy No Retinopathy         This result is from an external source.      Not at [facility], pt to d/c [name]  due to gastric emphysema during recent hospitaliuzation felt to be triggered/ cause by Mounjarpo

## 2024-06-19 NOTE — Assessment & Plan Note (Signed)
 Currently controlled on medication raking HOWEVER Cardiology to review next week and make final determination meds recommended T to take meds to visit

## 2024-06-19 NOTE — Telephone Encounter (Signed)
 FYI Only or Action Required?: FYI only for provider.  Patient was last seen in primary care on 06/16/2024 by Antonetta Rollene BRAVO, MD.  Called Nurse Triage reporting Bradycardia.  Symptoms began today.  Interventions attempted: Rest, hydration, or home remedies.  Symptoms are: gradually improving.  Triage Disposition: See HCP Within 4 Hours (Or PCP Triage)  Patient/caregiver understands and will follow disposition?: Yes     Copied from CRM #[id number]. Topic: Clinical - Red Word Triage >> Jun 19, 2024 12:43 PM Willma R wrote: Red Word that prompted transfer to Nurse Triage: [name] [name] his home health calling to inform his resting heartbeat is 58 and went to 62 with walking. Has a headache and states early today it was so bad thought he was going to fall. Reason for Disposition  Age > 60 years  (Exception: Brief heartbeat symptoms that went away and now feels well.)  Answer Assessment - Initial Assessment Questions 1. DESCRIPTION: Please describe your heart rate or heartbeat that you are having (e.g., fast/slow, regular/irregular, skipped or extra beats, palpitations)     Slow heart rate was 58 rates  2. ONSET: When did it start? (e.g., minutes, hours, days)      This morning, rest HR was 58 but got up to 62 with walking 3. DURATION: How long does it last (e.g., seconds, minutes, hours)     Resting HR 4. PATTERN Does it come and go, or has it been constant since it started?  Does it get worse with exertion?   Are you feeling it now?     headache 5. TAP: Using your hand, can you tap out what you are feeling on a chair or table in front of you, so that I can hear? Note: Not all patients can do this.       Was slow 6. HEART RATE: Can you tell me your heart rate? How many beats in 15 seconds?  Note: Not all patients can do this.       62  7. RECURRENT SYMPTOM: Have you ever had this before? If Yes, ask: When was the last time? and What happened that time?      no 8.  CAUSE: What do you think is causing the palpitations?     No palpations 9. CARDIAC HISTORY: Do you have any history of heart disease? (e.g., heart attack, angina, bypass surgery, angioplasty, arrhythmia)      Was wearing a Holter monitor 10. OTHER SYMPTOMS: Do you have any other symptoms? (e.g., dizziness, chest pain, sweating, difficulty breathing)       Dizziness- has resolved, headache          BP 138/64 and HR 58 this morning  Protocols used: Heart Rate and Heartbeat Questions-A-AH

## 2024-06-19 NOTE — Assessment & Plan Note (Signed)
 Recurrent episode requiring hospitalization in 05/2024, will f/u with Neurology , Baptsit, Cardiology, and hematology appt to determine underlying etiology for recurrent embolism

## 2024-06-20 ENCOUNTER — Encounter: Payer: Self-pay | Admitting: Internal Medicine

## 2024-06-20 ENCOUNTER — Ambulatory Visit (INDEPENDENT_AMBULATORY_CARE_PROVIDER_SITE_OTHER): Admitting: Internal Medicine

## 2024-06-20 VITALS — BP 125/68 | HR 60 | Ht 69.0 in | Wt 200.2 lb

## 2024-06-20 DIAGNOSIS — I67841 Reversible cerebrovascular vasoconstriction syndrome: Secondary | ICD-10-CM | POA: Insufficient documentation

## 2024-06-20 DIAGNOSIS — I10 Essential (primary) hypertension: Secondary | ICD-10-CM | POA: Diagnosis not present

## 2024-06-20 DIAGNOSIS — I25118 Atherosclerotic heart disease of native coronary artery with other forms of angina pectoris: Secondary | ICD-10-CM | POA: Diagnosis not present

## 2024-06-20 DIAGNOSIS — Z8673 Personal history of transient ischemic attack (TIA), and cerebral infarction without residual deficits: Secondary | ICD-10-CM | POA: Diagnosis not present

## 2024-06-20 MED ORDER — ISOSORBIDE MONONITRATE ER 60 MG PO TB24: 30.0000 mg | ORAL_TABLET | Freq: Two times a day (BID) | ORAL | 10 refills | Status: DC

## 2024-06-20 NOTE — Progress Notes (Unsigned)
 [facility] [address] [place] [address]    Clinic Day:  06/26/2024  Referring physician: Zarwolo, Gloria, FNP  Patient Care Team: Zarwolo, Gloria, FNP as PCP - General (Family Medicine) Debera Jayson MATSU, MD as PCP - Cardiology (Cardiology) Ladora Ross Lacy Phebe, MD as Referring Physician (Optometry) Jude Harden GAILS, MD as Consulting Physician (Pulmonary Disease) Pesci, Corin A, FNP (Dermatology) Eartha Angelia Sieving, MD as Consulting Physician (Gastroenterology) Malachy Comer GAILS, NP as Nurse Practitioner (Nurse Practitioner)   ASSESSMENT & PLAN:   Assessment: 1.  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 - Presented to the ER on 12/09/2022 with intermittent midsternal pain radiating to the arm.  Chest x-ray was normal. - Was diagnosed with COVID with home test positive around 12/21/2022 (symptoms fever 1 day and cough still lingering) - Presented to ER again on 12/22/2022 with same symptoms. - Third presentation to the ER on 12/26/2022 with same symptoms. - CT angio chest: Small segmental and subsegmental pulmonary embolus in the right middle lobe with no evidence of right heart strain.  Scattered groundglass opacities in the right lung and at the left lung base, likely infectious or inflammatory.  Hepatic cirrhosis and splenomegaly. - Ultrasound legs (12/27/2022): Left profundofemoral vein DVT.  No evidence of DVT in the right leg. - Chest pain completely resolved after Eliquis  started. - 20 pound weight loss since on [name] .  No fevers or night sweats. - Initially it was thought to be thrombosis related to COVID.  However patient had presentation to the ER with similar symptoms of midsternal pain radiating to the arm even prior to COVID infection on 12/09/2022.  Because of his sedentary job, it was classified as weakly provoked  to unprovoked. - Testing for lupus anticoagulant, anticardiolipin and antibeta 2 glycoprotein antibodies negative.   2.  Social/family history: - Lives at home with his wife.  He has been on disability for 19 years and recently started working as a Electrical engineer at [employer] for the last 1 year.  He is non-smoker.  Nonalcoholic. - No family history of thrombosis.  Mother had 1 miscarriage. - Paternal grandfather had cancer.    Plan: 1.  Unprovoked DVT/PE: - He is tolerating Eliquis  very well.  Denies any bleeding issues or easy bruising. - He is continuing to work as a Electrical engineer, mostly sedentary. - I have recommended continuing anticoagulation at this time as benefits outweigh risks. - Because of his thrombocytopenia, will closely monitor.  RTC 6 months with repeat CBC and risk-benefit analysis to continue anticoagulation.   2.  Mild leukopenia and moderate thrombocytopenia: - He has evidence of cirrhosis with splenomegaly on CT scan. - B12, MMA and folic acid  levels on 01/24/2024 is normal.  White count and platelet count are low but stable at 2.9 and platelet count 66k respectively. - He follows up with GI service for cirrhosis.  3.  Postphlebitic syndrome of the left leg: - He reports on and off swelling of the left leg, likely from post phlebitis syndrome. - Recommend wearing compression socks.  He may try 15-20 mmHg compression socks initially and increase to 20-30 mmHg if still has swelling.  4.  Altered mental status/encephalopathy: -Patient had recent hospitalization for encephalopathy and stroke rule out. -Per imaging and MD evaluation at discharge, no clot was found.  -He was discharged  on Eliquis  but encouraged to have hypercoagulable workup. -Previously hypercoagulable workup was negative. -Will add on labs for today and call with results in 2 weeks. -Discussed with Dr. Davonna continuing Eliquis .   I spent 25 minutes dedicated to the care of this patient  (face-to-face and non-face-to-face) on the date of the encounter to include what is described in the assessment and plan.  Orders Placed This Encounter  Procedures   Von Willebrand panel    Standing Status:   Future    Number of Occurrences:   1    Expected Date:   06/21/2024    Expiration Date:   09/19/2024   Prothrombin gene mutation    Standing Status:   Future    Number of Occurrences:   1    Expected Date:   06/21/2024    Expiration Date:   09/19/2024   PROTEIN S PANEL    Standing Status:   Future    Number of Occurrences:   1    Expected Date:   06/21/2024    Expiration Date:   09/19/2024   Protein C, total    Standing Status:   Future    Number of Occurrences:   1    Expected Date:   06/21/2024    Expiration Date:   09/19/2024   Antithrombin panel    Standing Status:   Future    Number of Occurrences:   1    Expected Date:   06/21/2024    Expiration Date:   09/19/2024   Lupus anticoagulant panel    Standing Status:   Future    Number of Occurrences:   1    Expected Date:   06/21/2024    Expiration Date:   09/19/2024   Fibrinogen     Standing Status:   Future    Number of Occurrences:   1    Expected Date:   06/21/2024    Expiration Date:   09/19/2024   Beta-2 -glycoprotein i abs, IgG/M/A    Standing Status:   Future    Number of Occurrences:   1    Expected Date:   06/21/2024    Expiration Date:   09/19/2024   CBC with Differential    Standing Status:   Future    Number of Occurrences:   1    Expected Date:   06/21/2024    Expiration Date:   09/19/2024   Comprehensive metabolic panel    Standing Status:   Future    Number of Occurrences:   1    Expected Date:   06/21/2024    Expiration Date:   09/19/2024    Delon FORBES Hope, NP   8/18/20257:56 AM  CHIEF COMPLAINT:   Diagnosis: DVT/PE    Cancer Staging  No matching staging information was found for the patient.    Prior Therapy: none  Current Therapy:  Eliquis    HISTORY OF PRESENT ILLNESS:    Oncology History   No history exists.     INTERVAL HISTORY:   [name] [name] is a 68 y.o. male presenting to clinic today for follow up of DVT/PE.   The in the interim, patient was admitted from 05/27/24-06/02/24 for metabolic encephalopathy due to reversible cerebral vasoconstriction syndrome.  Initial presentation for facial droop and it was thought that he had a stroke.  Imaging was essentially unremarkable for a stroke but did suggest RCVS.  He had to be sedated and intubated due to severe agitation and confusion.  Further embolic workup was obtained  with a cardiac MRI which was unremarkable.  He had a spinal tap which grew staph capitis which was deemed to be a contaminant. He was referred back to hematology for hypercoagulability workup.  At discharge, platelet count was 66,000.  White count 2.90 with a hemoglobin of 13.9.  INR 1.4.  He was instructed to resume Eliquis .   He was evaluated on 03/31/2024 for weakness and stroke rule out.  MRI of his brain did not reveal evidence of an acute intracranial abnormality.  Today, he states that he is doing well overall. His appetite level is at 100%. His energy level is at 100%.  PAST MEDICAL HISTORY:   Past Medical History: Past Medical History:  Diagnosis Date   Anxiety    Aortic insufficiency    Arthritis    Asthma    Cataract    Right eye   Chronic lower back pain    Cirrhosis of liver (HCC)    Coronary atherosclerosis of native coronary artery    a. 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   LIMA to LAD, free RIMA to OM2, SVG to PDA, open SVG harvest from right thigh    Stroke (HCC)    Thrombocytopenia (HCC)    TIA (transient ischemic attack)    Type 2 diabetes mellitus (HCC)     Surgical History: Past Surgical History:  Procedure Laterality Date   ANTERIOR CERVICAL DECOMP/DISCECTOMY FUSION  1998   C3-4   CARDIAC CATHETERIZATION  several   CARDIAC CATHETERIZATION N/A 12/30/2015   Procedure: Left Heart Cath and Coronary Angiography;  Surgeon: Dorn JINNY Lesches, MD;  Location: [facility] INVASIVE CV LAB;  Service: Cardiovascular;  Laterality: N/A;   CARPAL TUNNEL RELEASE Bilateral 2005   CHOLECYSTECTOMY N/A 11/28/2018   Procedure: LAPAROSCOPIC CHOLECYSTECTOMY;  Surgeon: Kallie Manuelita BROCKS, MD;  Location: [facility] ORS;  Service: General;  Laterality: N/A;   COLONOSCOPY     COLONOSCOPY WITH PROPOFOL  N/A 09/27/2020   Procedure: COLONOSCOPY WITH PROPOFOL ;  Surgeon: Eartha Angelia Sieving, MD;  Location: [facility] ENDO SUITE;  Service: Gastroenterology;  Laterality: N/A;   COLONOSCOPY WITH PROPOFOL  N/A 06/29/2023   Procedure: COLONOSCOPY WITH PROPOFOL ;  Surgeon: Eartha Angelia Sieving, MD;  Location: [facility] END[facility] SUITE;  Service: Gastroenterology;  Laterality: N/A;  1:30PM;ASA 3   CORONARY ANGIOPLASTY     CORONARY ANGIOPLASTY WITH STENT PLACEMENT  2002; 2003; 11/20/2014   I have 4 stents after today (11/20/2014)   CORONARY ARTERY BYPASS GRAFT N/A 01/09/2016   Procedure: CORONARY ARTERY BYPASS GRAFTING (CABG) X 3 UTILIZING RIGHT AND LEFT INTERNAL MAMMARY ARTERY AND ENDOSCOPICALLY HARVESTED SAPHENEOUS VEIN.;  Surgeon: Sudie VEAR Laine, MD;  Location: [facility] OR;  Service: Open Heart Surgery;  Laterality: N/A;   ESOPHAGOGASTRODUODENOSCOPY     ESOPHAGOGASTRODUODENOSCOPY (EGD) WITH PROPOFOL  N/A 09/27/2020   Procedure: ESOPHAGOGASTRODUODENOSCOPY (EGD) WITH PROPOFOL ;  Surgeon: Eartha Angelia Sieving, MD;  Location: [facility];  Service: Gastroenterology;  Laterality: N/A;  10   ESOPHAGOGASTRODUODENOSCOPY (EGD) WITH PROPOFOL  N/A 06/29/2023   Procedure: ESOPHAGOGASTRODUODENOSCOPY (EGD)  WITH PROPOFOL ;  Surgeon: Eartha Flavors, Toribio, MD;  Location: [facility];  Service: Gastroenterology;  Laterality: N/A;  1:30PM;ASA 3   KNEE SURGERY Left 02/2012   scraped; open   LEFT HEART CATH AND CORS/GRAFTS ANGIOGRAPHY N/A 08/09/2017   Procedure: LEFT HEART CATH AND CORS/GRAFTS ANGIOGRAPHY;  Surgeon: Mady Bruckner, MD;  Location: [facility] INVASIVE CV LAB;  Service: Cardiovascular;  Laterality: N/A;   LEFT HEART CATH AND CORS/GRAFTS ANGIOGRAPHY N/A 11/22/2018   Procedure: LEFT HEART CATH AND CORS/GRAFTS ANGIOGRAPHY;  Surgeon: Swaziland, [name], MD;  Location: [facility] INVASIVE CV LAB;  Service: Cardiovascular;  Laterality: N/A;   LEFT HEART CATH AND CORS/GRAFTS ANGIOGRAPHY N/A 08/20/2022   Procedure: LEFT HEART CATH AND CORS/GRAFTS ANGIOGRAPHY;  Surgeon: Verlin Bruckner BIRCH, MD;  Location: [facility] INVASIVE CV LAB;  Service: Cardiovascular;  Laterality: N/A;   LEFT HEART CATHETERIZATION WITH CORONARY ANGIOGRAM N/A 07/20/2012   Procedure: LEFT HEART CATHETERIZATION WITH CORONARY ANGIOGRAM;  Surgeon: Deatrice DELENA Cage, MD;  Location: [facility] CATH LAB;  Service: Cardiovascular;  Laterality: N/A;   LEFT HEART CATHETERIZATION WITH CORONARY ANGIOGRAM N/A 11/20/2014   Procedure: LEFT HEART CATHETERIZATION WITH CORONARY ANGIOGRAM;  Surgeon: [name] Swaziland, MD;  Location: [facility] CATH LAB;  Service: Cardiovascular;  Laterality: N/A;   LEFT HEART CATHETERIZATION WITH CORONARY ANGIOGRAM N/A 11/26/2014   Procedure: LEFT HEART CATHETERIZATION WITH CORONARY ANGIOGRAM;  Surgeon: [name] Swaziland, MD;  Location: [facility] CATH LAB;  Service: Cardiovascular;  Laterality: N/A;   NEUROPLASTY / TRANSPOSITION ULNAR NERVE AT ELBOW Right ~ 2012   PERCUTANEOUS CORONARY ROTOBLATOR INTERVENTION (PCI-R)  11/20/2014   Procedure: PERCUTANEOUS CORONARY ROTOBLATOR INTERVENTION (PCI-R);  Surgeon: [name] Swaziland, MD;  Location: [facility] CATH LAB;  Service: Cardiovascular;;   POSTERIOR CERVICAL LAMINECTOMY Left 04/16/2022   Procedure: Laminectomy and Foraminotomy  - left - C6-C7;  Surgeon: Louis Shove, MD;  Location: [facility] OR;  Service: Neurosurgery;  Laterality: Left;  3C   SHOULDER ARTHROSCOPY Left ~ 2011   TEE WITHOUT CARDIOVERSION N/A 01/09/2016   Procedure: TRANSESOPHAGEAL ECHOCARDIOGRAM (TEE);  Surgeon: Sudie VEAR Laine, MD;  Location: [facility] OR;  Service: Open Heart Surgery;  Laterality: N/A;    Social History: Social History   Socioeconomic History   Marital status: Married    Spouse name: [name]   Number of children: 3   Years of education: 12 th    Highest education level: Not on file  Occupational History   Occupation: Unemployed  Tobacco Use   Smoking status: Never    Passive exposure: Current   Smokeless tobacco: Never  Vaping Use   Vaping status: Never Used  Substance and Sexual Activity   Alcohol use: No   Drug use: No   Sexual activity: Not on file  Other Topics Concern   Not on file  Social History Narrative   Patient lives at home with wife [name].   Patient works at [employer], Social research officer, government.   Patient has a 12 th grade education.    Patient has 3 children.       Updated 11/30/2023:   Works at [employer] as a Engineer, materials      Social Drivers of Corporate investment banker Strain: Low Risk  (11/30/2023)   Overall Financial Resource Strain (CARDIA)    Difficulty of Paying Living Expenses: Not hard at all  Food Insecurity: Low Risk  (05/28/2024)   Received from [facility]   Hunger Vital Sign    Within the past 12 months, you worried that your food would run out  before you got money to buy more: Never true    Within the past 12 months, the food you bought just didn't last and you didn't have money to get more. : Never true  Transportation Needs: No Transportation Needs (05/28/2024)   Received from [facility]    In the past 12 months, has lack of reliable transportation kept you from medical appointments, meetings, work or from getting things needed for daily living? : No  Physical  Activity: Sufficiently Active (11/30/2023)   Exercise Vital Sign    Days of Exercise per Week: 3 days    Minutes of Exercise per Session: 60 min  Stress: No Stress Concern Present (11/30/2023)   [employer] of Occupational Health - Occupational Stress Questionnaire    Feeling of Stress : Not at all  Social Connections: Socially Integrated (11/30/2023)   Social Connection and Isolation Panel    Frequency of Communication with Friends and Family: More than three times a week    Frequency of Social Gatherings with Friends and Family: More than three times a week    Attends Religious Services: More than 4 times per year    Active Member of [employer] or Organizations: Yes    Attends Banker Meetings: More than 4 times per year    Marital Status: Married  Catering manager Violence: Not At Risk (11/30/2023)   Humiliation, Afraid, Rape, and Kick questionnaire    Fear of Current or Ex-Partner: No    Emotionally Abused: No    Physically Abused: No    Sexually Abused: No    Family History: Family History  Problem Relation Age of Onset   Stroke Mother    Coronary artery disease Father 21   Asthma Sister    Multiple sclerosis Brother    Heart disease Paternal Uncle    Stroke Maternal Grandmother    Heart attack Paternal Grandmother    Cancer Paternal Grandfather    Asthma Sister    Seizures Son    Migraines Son    Autism Son    Migraines Son     Current Medications:  Current Outpatient Medications:    tirzepatide  ([name] ) 7.5 MG/0.5ML Pen, Inject 7.5 mg into the skin., Disp: , Rfl:    albuterol  (VENTOLIN  HFA) 108 (90 Base) MCG/ACT inhaler, Inhale 2 puffs into the lungs every 6 (six) hours as needed for wheezing or shortness of breath., Disp: , Rfl:    carvedilol  (COREG ) 6.25 MG tablet, Take 1 tablet (6.25 mg total) by mouth 2 (two) times daily with a meal., Disp: 180 tablet, Rfl: 3   citalopram  (CELEXA ) 20 MG tablet, TAKE 1 TABLET BY MOUTH AT BEDTIME., Disp: 30 tablet,  Rfl: 10   Continuous Glucose Receiver (FREESTYLE LIBRE 2 READER) DEVI, 1 Device by Does not apply route continuous., Disp: 1 each, Rfl: 0   Continuous Glucose Sensor (FREESTYLE LIBRE 2 SENSOR) MISC, USE 1 EACH FOR 14 DAYS, Disp: 2 each, Rfl: 10   cyclobenzaprine  (FLEXERIL ) 10 MG tablet, TAKE 1 TABLET BY MOUTH 3 TIMES DAILY AS NEEDED FOR MUSCLE SPASMS, Disp: 30 tablet, Rfl: 10   ELIQUIS  5 MG TABS tablet, TAKE 1 TABLET BY MOUTH TWICE DAILY, Disp: 60 tablet, Rfl: 10   ezetimibe  (ZETIA ) 10 MG tablet, TAKE 1 TABLET BY MOUTH ONCE DAILY, Disp: 30 tablet, Rfl: 11   FARXIGA  5 MG TABS tablet, TAKE 1 TABLET BY MOUTH DAILY BEFORE BREAKFAST, Disp: 30 tablet, Rfl: 10   fluticasone  (FLONASE ) 50 MCG/ACT nasal spray,  PLACE 2 SPRAYS INTO BOTH NOSTRILS 2 TIMES DAILY., Disp: 16 g, Rfl: 11   fluticasone -salmeterol (ADVAIR ) 250-50 MCG/ACT AEPB, INHALE 1 PUFF BY MOUTH TWICE DAILY, Disp: 60 each, Rfl: 11   gabapentin  (NEURONTIN ) 300 MG capsule, TAKE 1 CAPSULE BY MOUTH THREE TIMES DAILY, Disp: 90 capsule, Rfl: 11   isosorbide  mononitrate (IMDUR ) 60 MG 24 hr tablet, Take 0.5 tablets (30 mg total) by mouth 2 (two) times daily., Disp: 30 tablet, Rfl: 10   Melatonin 10 MG TABS, Take 10 mg by mouth at bedtime., Disp: , Rfl:    methocarbamol  (ROBAXIN ) 500 MG tablet, Take 500 mg by mouth daily., Disp: , Rfl:    naproxen  (NAPROSYN ) 500 MG tablet, Take 500 mg by mouth daily as needed., Disp: , Rfl:    nitroGLYCERIN  (NITROSTAT ) 0.4 MG SL tablet, DISSOLVE 1 TABLET UNDER THE TONGUE AS NEEDED FOR CHEST PAIN EVERY 5 MINUTES UP TO 3 TIMES. IF NO RELIEF CALL 911., Disp: 25 tablet, Rfl: 11   omeprazole  (PRILOSEC) 40 MG capsule, TAKE 1 CAPSULE BY MOUTH DAILY., Disp: 30 capsule, Rfl: 11   pravastatin  (PRAVACHOL ) 20 MG tablet, TAKE 1 TABLET BY MOUTH DAILY., Disp: 30 tablet, Rfl: 10   topiramate  (TOPAMAX ) 50 MG tablet, TAKE 1 TABLET BY MOUTH TWICE DAILY, Disp: 180 tablet, Rfl: 11   zonisamide  (ZONEGRAN ) 100 MG capsule, Take 100 mg by mouth  daily., Disp: , Rfl:    Allergies: Allergies  Allergen Reactions   [name]  [Tirzepatide ] Nausea And Vomiting    Hopsitalized in 05/2024 with gastric emphysema , thought to be caused by recently started mounjaro    Divalproex Sodium Other (See Comments)    Causes anger   Statins Other (See Comments)    Muscle aches and cramps   Tramadol  Other (See Comments)    Chest pain    Valproic Acid Other (See Comments)    Causes anger   Gadolinium Derivatives Nausea And Vomiting    07/25/19 Pt vomited immediately after IV gad. Denies itching, dyspnea.  (Adverse, not allergic, reaction   Tricor  [Fenofibrate ] Other (See Comments)    Leg cramps    REVIEW OF SYSTEMS:   Review of Systems  Constitutional:  Positive for fatigue.  HENT:   Positive for trouble swallowing.   Eyes:  Positive for eye problems.  Cardiovascular:  Positive for chest pain.  Neurological:  Positive for dizziness and headaches.     VITALS:   Blood pressure 111/67, pulse 76, temperature (!) 96.5 F (35.8 C), temperature source Tympanic, resp. rate 20, weight 197 lb 15.6 oz (89.8 kg), SpO2 98%.  Wt Readings from Last 3 Encounters:  06/21/24 197 lb 15.6 oz (89.8 kg)  06/20/24 200 lb 3.2 oz (90.8 kg)  06/16/24 199 lb 1.9 oz (90.3 kg)    Body mass index is 29.24 kg/m.  Performance status (ECOG): 1 - Symptomatic but completely ambulatory  PHYSICAL EXAM:   Physical Exam Constitutional:      Appearance: Normal appearance.  Cardiovascular:     Rate and Rhythm: Normal rate and regular rhythm.  Pulmonary:     Effort: Pulmonary effort is normal.     Breath sounds: Normal breath sounds.  Abdominal:     General: Bowel sounds are normal.     Palpations: Abdomen is soft.  Musculoskeletal:        General: No swelling. Normal range of motion.  Neurological:     Mental Status: He is alert and oriented to person, place, and time. Mental status is at baseline.  LABS:   CBC     Component Value Date/Time   WBC  2.9 (L) 06/21/2024 1048   RBC 4.81 06/21/2024 1048   HGB 15.6 06/21/2024 1048   HGB 16.1 04/07/2024 1029   HCT 45.0 06/21/2024 1048   HCT 47.0 04/07/2024 1029   PLT 69 (L) 06/21/2024 1048   PLT 71 (LL) 04/07/2024 1029   MCV 93.6 06/21/2024 1048   MCV 93 04/07/2024 1029   MCH 32.4 06/21/2024 1048   MCHC 34.7 06/21/2024 1048   RDW 15.5 06/21/2024 1048   RDW 13.6 04/07/2024 1029   LYMPHSABS 0.8 06/21/2024 1048   LYMPHSABS 0.7 04/07/2024 1029   MONOABS 0.2 06/21/2024 1048   EOSABS 0.1 06/21/2024 1048   EOSABS 0.1 04/07/2024 1029   BASOSABS 0.0 06/21/2024 1048   BASOSABS 0.0 04/07/2024 1029    CMP      Component Value Date/Time   NA 137 06/21/2024 1048   NA 141 04/07/2024 1029   K 3.8 06/21/2024 1048   CL 104 06/21/2024 1048   CO2 24 06/21/2024 1048   GLUCOSE 141 (H) 06/21/2024 1048   BUN 15 06/21/2024 1048   BUN 18 04/07/2024 1029   CREATININE 1.22 06/21/2024 1048   CREATININE 1.04 08/15/2020 1451   CALCIUM  9.4 06/21/2024 1048   PROT 7.5 06/21/2024 1048   PROT 6.7 02/15/2024 1332   ALBUMIN  3.9 06/21/2024 1048   ALBUMIN  3.9 02/15/2024 1332   AST 31 06/21/2024 1048   ALT 26 06/21/2024 1048   ALKPHOS 46 06/21/2024 1048   BILITOT 0.7 06/21/2024 1048   BILITOT 0.5 02/15/2024 1332   GFRNONAA >60 06/21/2024 1048   GFRAA >60 07/27/2020 0525     No results found for: CEA1, CEA / No results found for: CEA1, CEA No results found for: PSA1 No results found for: CAN199 No results found for: RJW874  Lab Results  Component Value Date   TOTALPROTELP 7.8 12/26/2018   ALBUMINELP 3.1 12/26/2018   A1GS 0.3 12/26/2018   A2GS 1.0 12/26/2018   BETS 1.6 (H) 12/26/2018   GAMS 1.8 12/26/2018   MSPIKE Not Observed 12/26/2018   SPEI Comment 12/26/2018   Lab Results  Component Value Date   TIBC 388 08/20/2021   TIBC 402 02/18/2021   TIBC 374 05/31/2020   FERRITIN 27 08/20/2021   FERRITIN 36 02/18/2021   FERRITIN 100 05/31/2020   IRONPCTSAT 29 08/20/2021    IRONPCTSAT 25 02/18/2021   IRONPCTSAT 27 05/31/2020   Lab Results  Component Value Date   LDH 157 02/18/2021   LDH 138 08/30/2019     STUDIES:   No results found.

## 2024-06-20 NOTE — Assessment & Plan Note (Signed)
 Currently on Eliquis , statin and Zetia  Would be unusual to have new ischemic CVA while on Eliquis  Awaiting evaluation by neurology at [facility]

## 2024-06-20 NOTE — Assessment & Plan Note (Signed)
 BP Readings from Last 1 Encounters:  06/20/24 125/68   Well-controlled with carvedilol  6.25 mg twice daily and Imdur  Would avoid reducing dose of carvedilol  as his HR is WNL today, his dizziness is less likely related to his HR Advised to take half tablet instead of whole tablet of Imdur  twice daily to reduce incidence of morning headaches Counseled for compliance with the medications Advised DASH diet and moderate exercise/walking as tolerated

## 2024-06-20 NOTE — Patient Instructions (Addendum)
 Please take Imdur  half tablet twice daily for now.  Please maintain at least 64 ounces of fluid intake in a day.  Please start taking [name]  again.  Please follow small, frequent meals.

## 2024-06-20 NOTE — Assessment & Plan Note (Signed)
 Recent hospitalization at [facility] revealed reversible cerebrovascular vasoconstriction syndrome changes on MRI brain Had uncontrolled BP at that time, but currently has well-controlled HTN Would continue carvedilol  for now considering history of CAD Advised to maintain adequate hydration and focus on proper rest Follow up with neurology at [facility]

## 2024-06-20 NOTE — Assessment & Plan Note (Addendum)
 Has h/o CABG On Coreg , Eliquis , statin and Zetia  On Imdur , reduce dose to 30 mg BID due to morning headaches Followed by cardiology

## 2024-06-20 NOTE — Progress Notes (Addendum)
 Acute Office Visit  Subjective:    Patient ID: [name] [name], male    DOB: 1956-04-20, 68 y.o.   MRN: [id number]  Chief Complaint  Patient presents with   Dizziness    Pt reports sx of lower heart rate and some dizziness. Left side was blurry.     HPI Patient is in today for evaluation of an episode of ?  Bradycardia at home. His home PT advised him to get evaluated as his HR was 58 at home and he felt slight dizzy at that time. He reports feeling intermittent dizziness and headache in the morning.  Of note, he has history of CAD.  He takes carvedilol  6.25 mg BID and Imdur  60 mg every morning and 30 mg every afternoon.  He currently denies any chest pain, dyspnea or palpitations.  He also reports an episode of blurry vision this morning, which lasted for few minutes.  He was recently admitted at [facility] for reversible cerebrovascular vasoconstriction syndrome.  He has been referred to neurology from that, he is awaiting evaluation at [facility].  He is followed by cardiology, takes Eliquis  currently for history of PE.  He has a history of CVA, is currently taking statin and Zetia .  Past Medical History:  Diagnosis Date   Anxiety    Aortic insufficiency    Arthritis    Asthma    Cataract    Right eye   Chronic lower back pain    Cirrhosis of liver (HCC)    Coronary atherosclerosis of native coronary artery    a. 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   LIMA to LAD, free RIMA to OM2, SVG to PDA, open SVG harvest from right thigh   Stroke (HCC)    Thrombocytopenia (HCC)    TIA (transient  ischemic attack)    Type 2 diabetes mellitus (HCC)     Past Surgical History:  Procedure Laterality Date   ANTERIOR CERVICAL DECOMP/DISCECTOMY FUSION  1998   C3-4   CARDIAC CATHETERIZATION  several   CARDIAC CATHETERIZATION N/A 12/30/2015   Procedure: Left Heart Cath and Coronary Angiography;  Surgeon: Dorn JINNY Lesches, MD;  Location: [facility] INVASIVE CV LAB;  Service: Cardiovascular;  Laterality: N/A;   CARPAL TUNNEL RELEASE Bilateral 2005   CHOLECYSTECTOMY N/A 11/28/2018   Procedure: LAPAROSCOPIC CHOLECYSTECTOMY;  Surgeon: Kallie Manuelita BROCKS, MD;  Location: [facility] ORS;  Service: General;  Laterality: N/A;   COLONOSCOPY     COLONOSCOPY WITH PROPOFOL  N/A 09/27/2020   Procedure: COLONOSCOPY WITH PROPOFOL ;  Surgeon: Eartha Angelia Sieving, MD;  Location: [facility] ENDO SUITE;  Service: Gastroenterology;  Laterality: N/A;   COLONOSCOPY WITH PROPOFOL  N/A 06/29/2023   Procedure: COLONOSCOPY WITH PROPOFOL ;  Surgeon: Eartha Angelia Sieving, MD;  Location: [facility] END[facility] SUITE;  Service: Gastroenterology;  Laterality: N/A;  1:30PM;ASA 3   CORONARY ANGIOPLASTY     CORONARY ANGIOPLASTY WITH STENT PLACEMENT  2002; 2003; 11/20/2014   I have 4 stents after today (11/20/2014)   CORONARY ARTERY BYPASS GRAFT N/A  01/09/2016   Procedure: CORONARY ARTERY BYPASS GRAFTING (CABG) X 3 UTILIZING RIGHT AND LEFT INTERNAL MAMMARY ARTERY AND ENDOSCOPICALLY HARVESTED SAPHENEOUS VEIN.;  Surgeon: Sudie VEAR Laine, MD;  Location: [facility] OR;  Service: Open Heart Surgery;  Laterality: N/A;   ESOPHAGOGASTRODUODENOSCOPY     ESOPHAGOGASTRODUODENOSCOPY (EGD) WITH PROPOFOL  N/A 09/27/2020   Procedure: ESOPHAGOGASTRODUODENOSCOPY (EGD) WITH PROPOFOL ;  Surgeon: Eartha Angelia Sieving, MD;  Location: [facility];  Service: Gastroenterology;  Laterality: N/A;  10   ESOPHAGOGASTRODUODENOSCOPY (EGD) WITH PROPOFOL  N/A 06/29/2023   Procedure: ESOPHAGOGASTRODUODENOSCOPY (EGD) WITH PROPOFOL ;  Surgeon: Eartha Angelia Sieving, MD;  Location: [facility];   Service: Gastroenterology;  Laterality: N/A;  1:30PM;ASA 3   KNEE SURGERY Left 02/2012   scraped; open   LEFT HEART CATH AND CORS/GRAFTS ANGIOGRAPHY N/A 08/09/2017   Procedure: LEFT HEART CATH AND CORS/GRAFTS ANGIOGRAPHY;  Surgeon: Mady Bruckner, MD;  Location: [facility] INVASIVE CV LAB;  Service: Cardiovascular;  Laterality: N/A;   LEFT HEART CATH AND CORS/GRAFTS ANGIOGRAPHY N/A 11/22/2018   Procedure: LEFT HEART CATH AND CORS/GRAFTS ANGIOGRAPHY;  Surgeon: Swaziland, [name], MD;  Location: [facility] INVASIVE CV LAB;  Service: Cardiovascular;  Laterality: N/A;   LEFT HEART CATH AND CORS/GRAFTS ANGIOGRAPHY N/A 08/20/2022   Procedure: LEFT HEART CATH AND CORS/GRAFTS ANGIOGRAPHY;  Surgeon: Verlin Bruckner BIRCH, MD;  Location: [facility] INVASIVE CV LAB;  Service: Cardiovascular;  Laterality: N/A;   LEFT HEART CATHETERIZATION WITH CORONARY ANGIOGRAM N/A 07/20/2012   Procedure: LEFT HEART CATHETERIZATION WITH CORONARY ANGIOGRAM;  Surgeon: Deatrice DELENA Cage, MD;  Location: [facility] CATH LAB;  Service: Cardiovascular;  Laterality: N/A;   LEFT HEART CATHETERIZATION WITH CORONARY ANGIOGRAM N/A 11/20/2014   Procedure: LEFT HEART CATHETERIZATION WITH CORONARY ANGIOGRAM;  Surgeon: [name] Swaziland, MD;  Location: [facility] CATH LAB;  Service: Cardiovascular;  Laterality: N/A;   LEFT HEART CATHETERIZATION WITH CORONARY ANGIOGRAM N/A 11/26/2014   Procedure: LEFT HEART CATHETERIZATION WITH CORONARY ANGIOGRAM;  Surgeon: [name] Swaziland, MD;  Location: [facility] CATH LAB;  Service: Cardiovascular;  Laterality: N/A;   NEUROPLASTY / TRANSPOSITION ULNAR NERVE AT ELBOW Right ~ 2012   PERCUTANEOUS CORONARY ROTOBLATOR INTERVENTION (PCI-R)  11/20/2014   Procedure: PERCUTANEOUS CORONARY ROTOBLATOR INTERVENTION (PCI-R);  Surgeon: [name] Swaziland, MD;  Location: [facility] CATH LAB;  Service: Cardiovascular;;   POSTERIOR CERVICAL LAMINECTOMY Left 04/16/2022   Procedure: Laminectomy and Foraminotomy - left - C6-C7;  Surgeon: Louis Shove, MD;  Location: [facility] OR;  Service:  Neurosurgery;  Laterality: Left;  3C   SHOULDER ARTHROSCOPY Left ~ 2011   TEE WITHOUT CARDIOVERSION N/A 01/09/2016   Procedure: TRANSESOPHAGEAL ECHOCARDIOGRAM (TEE);  Surgeon: Sudie VEAR Laine, MD;  Location: [facility] OR;  Service: Open Heart Surgery;  Laterality: N/A;    Family History  Problem Relation Age of Onset   Stroke Mother    Coronary artery disease Father 49   Asthma Sister    Multiple sclerosis Brother    Heart disease Paternal Uncle    Stroke Maternal Grandmother    Heart attack Paternal Grandmother    Cancer Paternal Grandfather    Asthma Sister    Seizures Son    Migraines Son    Autism Son    Migraines Son     Social History   Socioeconomic History   Marital status: Married    Spouse name: [name]   Number of children: 3   Years of education: 12 th    Highest education level: Not on file  Occupational History   Occupation: Unemployed  Tobacco Use   Smoking  status: Never    Passive exposure: Current   Smokeless tobacco: Never  Vaping Use   Vaping status: Never Used  Substance and Sexual Activity   Alcohol use: No   Drug use: No   Sexual activity: Not on file  Other Topics Concern   Not on file  Social History Narrative   Patient lives at home with wife [name].   Patient works at [employer], Social research officer, government.   Patient has a 12 th grade education.    Patient has 3 children.       Updated 11/30/2023:   Works at [employer] as a Engineer, materials      Social Drivers of Corporate investment banker Strain: Low Risk  (11/30/2023)   Overall Financial Resource Strain (CARDIA)    Difficulty of Paying Living Expenses: Not hard at all  Food Insecurity: Low Risk  (05/28/2024)   Received from [facility]   Hunger Vital Sign    Within the past 12 months, you worried that your food would run out before you got money to buy more: Never true    Within the past 12 months, the food you bought just didn't last and you didn't have money to get more. : Never true   Transportation Needs: No Transportation Needs (05/28/2024)   Received from [facility]    In the past 12 months, has lack of reliable transportation kept you from medical appointments, meetings, work or from getting things needed for daily living? : No  Physical Activity: Sufficiently Active (11/30/2023)   Exercise Vital Sign    Days of Exercise per Week: 3 days    Minutes of Exercise per Session: 60 min  Stress: No Stress Concern Present (11/30/2023)   [employer] of Occupational Health - Occupational Stress Questionnaire    Feeling of Stress : Not at all  Social Connections: Socially Integrated (11/30/2023)   Social Connection and Isolation Panel    Frequency of Communication with Friends and Family: More than three times a week    Frequency of Social Gatherings with Friends and Family: More than three times a week    Attends Religious Services: More than 4 times per year    Active Member of [employer] or Organizations: Yes    Attends Banker Meetings: More than 4 times per year    Marital Status: Married  Catering manager Violence: Not At Risk (11/30/2023)   Humiliation, Afraid, Rape, and Kick questionnaire    Fear of Current or Ex-Partner: No    Emotionally Abused: No    Physically Abused: No    Sexually Abused: No    Outpatient Medications Prior to Visit  Medication Sig Dispense Refill   albuterol  (VENTOLIN  HFA) 108 (90 Base) MCG/ACT inhaler Inhale 2 puffs into the lungs every 6 (six) hours as needed for wheezing or shortness of breath.     carvedilol  (COREG ) 6.25 MG tablet Take 1 tablet (6.25 mg total) by mouth 2 (two) times daily with a meal. 180 tablet 3   citalopram  (CELEXA ) 20 MG tablet TAKE 1 TABLET BY MOUTH AT BEDTIME. 30 tablet 10   Continuous Glucose Receiver (FREESTYLE LIBRE 2 READER) DEVI 1 Device by Does not apply route continuous. 1 each 0   Continuous Glucose Sensor (FREESTYLE LIBRE 2 SENSOR) MISC USE 1 EACH FOR 14 DAYS 2 each 10    cyclobenzaprine  (FLEXERIL ) 10 MG tablet TAKE 1 TABLET BY MOUTH 3 TIMES DAILY AS NEEDED FOR MUSCLE SPASMS 30 tablet  10   ELIQUIS  5 MG TABS tablet TAKE 1 TABLET BY MOUTH TWICE DAILY 60 tablet 10   ezetimibe  (ZETIA ) 10 MG tablet TAKE 1 TABLET BY MOUTH ONCE DAILY 30 tablet 11   FARXIGA  5 MG TABS tablet TAKE 1 TABLET BY MOUTH DAILY BEFORE BREAKFAST 30 tablet 10   fluticasone  (FLONASE ) 50 MCG/ACT nasal spray PLACE 2 SPRAYS INTO BOTH NOSTRILS 2 TIMES DAILY. 16 g 11   fluticasone -salmeterol (ADVAIR ) 250-50 MCG/ACT AEPB INHALE 1 PUFF BY MOUTH TWICE DAILY 60 each 11   gabapentin  (NEURONTIN ) 300 MG capsule TAKE 1 CAPSULE BY MOUTH THREE TIMES DAILY 90 capsule 11   Melatonin 10 MG TABS Take 10 mg by mouth at bedtime.     methocarbamol  (ROBAXIN ) 500 MG tablet Take 500 mg by mouth daily.     naproxen  (NAPROSYN ) 500 MG tablet Take 500 mg by mouth daily as needed.     nitroGLYCERIN  (NITROSTAT ) 0.4 MG SL tablet DISSOLVE 1 TABLET UNDER THE TONGUE AS NEEDED FOR CHEST PAIN EVERY 5 MINUTES UP TO 3 TIMES. IF NO RELIEF CALL 911. 25 tablet 11   omeprazole  (PRILOSEC) 40 MG capsule TAKE 1 CAPSULE BY MOUTH DAILY. 30 capsule 11   pravastatin  (PRAVACHOL ) 20 MG tablet TAKE 1 TABLET BY MOUTH DAILY. 30 tablet 10   topiramate  (TOPAMAX ) 50 MG tablet TAKE 1 TABLET BY MOUTH TWICE DAILY 180 tablet 11   zonisamide  (ZONEGRAN ) 100 MG capsule Take 100 mg by mouth daily.     isosorbide  mononitrate (IMDUR ) 60 MG 24 hr tablet TAKE 1 TABLET (60MG ) BY MOUTH IN THE MORNING AND 1/2 TABLET (30MG ) IN THE EVENING 45 tablet 10   No facility-administered medications prior to visit.    Allergies  Allergen Reactions   [name]  [Tirzepatide ] Nausea And Vomiting    Hopsitalized in 05/2024 with gastric emphysema , thought to be caused by recently started mounjaro    Divalproex Sodium Other (See Comments)    Causes anger   Statins Other (See Comments)    Muscle aches and cramps   Tramadol  Other (See Comments)    Chest pain    Valproic Acid Other  (See Comments)    Causes anger   Gadolinium Derivatives Nausea And Vomiting    07/25/19 Pt vomited immediately after IV gad. Denies itching, dyspnea.  (Adverse, not allergic, reaction   Tricor  [Fenofibrate ] Other (See Comments)    Leg cramps    Review of Systems  Constitutional:  Negative for chills and fever.  HENT:  Negative for congestion and sore throat.   Eyes:  Negative for pain and discharge.  Respiratory:  Negative for cough and shortness of breath.   Cardiovascular:  Negative for chest pain and palpitations.  Gastrointestinal:  Negative for diarrhea, nausea and vomiting.  Endocrine: Negative for polydipsia and polyuria.  Genitourinary:  Negative for dysuria and hematuria.  Musculoskeletal:  Negative for neck pain and neck stiffness.  Skin:  Negative for rash.  Neurological:  Positive for dizziness and headaches. Negative for weakness.  Psychiatric/Behavioral:  Negative for agitation and behavioral problems.        Objective:    Physical Exam Vitals reviewed.  Constitutional:      General: He is not in acute distress.    Appearance: He is not diaphoretic.  HENT:     Head: Normocephalic and atraumatic.     Nose: Nose normal.     Mouth/Throat:     Mouth: Mucous membranes are moist.  Eyes:     General: No scleral icterus.  Extraocular Movements: Extraocular movements intact.  Cardiovascular:     Rate and Rhythm: Normal rate and regular rhythm.     Heart sounds: Normal heart sounds. No murmur heard. Pulmonary:     Breath sounds: Normal breath sounds. No wheezing or rales.  Musculoskeletal:     Cervical back: Neck supple. No tenderness.     Right lower leg: No edema.     Left lower leg: No edema.  Skin:    General: Skin is warm.     Findings: No rash.  Neurological:     General: No focal deficit present.     Mental Status: He is alert and oriented to person, place, and time.  Psychiatric:        Mood and Affect: Mood normal.        Behavior: Behavior  normal.     BP 125/68   Pulse 60   Ht 5' 9 (1.753 m)   Wt 200 lb 3.2 oz (90.8 kg)   SpO2 98%   BMI 29.56 kg/m  Wt Readings from Last 3 Encounters:  06/20/24 200 lb 3.2 oz (90.8 kg)  06/16/24 199 lb 1.9 oz (90.3 kg)  04/07/24 204 lb 12.8 oz (92.9 kg)        Assessment & Plan:   Problem List Items Addressed This Visit       Cardiovascular and Mediastinum   Essential hypertension, benign - Primary   BP Readings from Last 1 Encounters:  06/20/24 125/68   Well-controlled with carvedilol  6.25 mg twice daily and Imdur  Would avoid reducing dose of carvedilol  as his HR is WNL today, his dizziness is less likely related to his HR Advised to take half tablet instead of whole tablet of Imdur  twice daily to reduce incidence of morning headaches Counseled for compliance with the medications Advised DASH diet and moderate exercise/walking as tolerated      Relevant Medications   isosorbide  mononitrate (IMDUR ) 60 MG 24 hr tablet   Coronary artery disease   Has h/o CABG On Coreg , Eliquis , statin and Zetia  On Imdur , reduce dose to 30 mg BID due to morning headaches Followed by cardiology      Relevant Medications   isosorbide  mononitrate (IMDUR ) 60 MG 24 hr tablet   Reversible cerebrovascular vasoconstriction syndrome   Recent hospitalization at [facility] revealed reversible cerebrovascular vasoconstriction syndrome changes on MRI brain Had uncontrolled BP at that time, but currently has well-controlled HTN Would continue carvedilol  for now considering history of CAD Advised to maintain adequate hydration and focus on proper rest Follow up with neurology at [facility]      Relevant Medications   isosorbide  mononitrate (IMDUR ) 60 MG 24 hr tablet     Other   History of CVA (cerebrovascular accident)   Currently on Eliquis , statin and Zetia  Would be unusual to have new ischemic CVA while on Eliquis  Awaiting evaluation by neurology at [facility]        Meds  ordered this encounter  Medications   isosorbide  mononitrate (IMDUR ) 60 MG 24 hr tablet    Sig: Take 0.5 tablets (30 mg total) by mouth 2 (two) times daily.    Dispense:  30 tablet    Refill:  10     Suzzane MARLA Blanch, MD

## 2024-06-21 ENCOUNTER — Inpatient Hospital Stay: Attending: Oncology | Admitting: Oncology

## 2024-06-21 ENCOUNTER — Inpatient Hospital Stay

## 2024-06-21 VITALS — BP 111/67 | HR 76 | Temp 96.5°F | Resp 20 | Wt 198.0 lb

## 2024-06-21 DIAGNOSIS — Z7901 Long term (current) use of anticoagulants: Secondary | ICD-10-CM | POA: Diagnosis not present

## 2024-06-21 DIAGNOSIS — K7460 Unspecified cirrhosis of liver: Secondary | ICD-10-CM | POA: Insufficient documentation

## 2024-06-21 DIAGNOSIS — I69892 Facial weakness following other cerebrovascular disease: Secondary | ICD-10-CM | POA: Diagnosis not present

## 2024-06-21 DIAGNOSIS — J45909 Unspecified asthma, uncomplicated: Secondary | ICD-10-CM | POA: Diagnosis not present

## 2024-06-21 DIAGNOSIS — Z951 Presence of aortocoronary bypass graft: Secondary | ICD-10-CM | POA: Diagnosis not present

## 2024-06-21 DIAGNOSIS — I2692 Saddle embolus of pulmonary artery without acute cor pulmonale: Secondary | ICD-10-CM

## 2024-06-21 DIAGNOSIS — Z86711 Personal history of pulmonary embolism: Secondary | ICD-10-CM | POA: Diagnosis not present

## 2024-06-21 DIAGNOSIS — G43909 Migraine, unspecified, not intractable, without status migrainosus: Secondary | ICD-10-CM | POA: Diagnosis not present

## 2024-06-21 DIAGNOSIS — D72819 Decreased white blood cell count, unspecified: Secondary | ICD-10-CM | POA: Diagnosis not present

## 2024-06-21 DIAGNOSIS — I5032 Chronic diastolic (congestive) heart failure: Secondary | ICD-10-CM | POA: Diagnosis not present

## 2024-06-21 DIAGNOSIS — I87002 Postthrombotic syndrome without complications of left lower extremity: Secondary | ICD-10-CM | POA: Insufficient documentation

## 2024-06-21 DIAGNOSIS — I2510 Atherosclerotic heart disease of native coronary artery without angina pectoris: Secondary | ICD-10-CM | POA: Diagnosis not present

## 2024-06-21 DIAGNOSIS — E785 Hyperlipidemia, unspecified: Secondary | ICD-10-CM | POA: Diagnosis not present

## 2024-06-21 DIAGNOSIS — K3189 Other diseases of stomach and duodenum: Secondary | ICD-10-CM | POA: Diagnosis not present

## 2024-06-21 DIAGNOSIS — I252 Old myocardial infarction: Secondary | ICD-10-CM | POA: Diagnosis not present

## 2024-06-21 DIAGNOSIS — K219 Gastro-esophageal reflux disease without esophagitis: Secondary | ICD-10-CM | POA: Diagnosis not present

## 2024-06-21 DIAGNOSIS — I67841 Reversible cerebrovascular vasoconstriction syndrome: Secondary | ICD-10-CM | POA: Diagnosis not present

## 2024-06-21 DIAGNOSIS — M5430 Sciatica, unspecified side: Secondary | ICD-10-CM | POA: Diagnosis not present

## 2024-06-21 DIAGNOSIS — Z86718 Personal history of other venous thrombosis and embolism: Secondary | ICD-10-CM | POA: Diagnosis not present

## 2024-06-21 DIAGNOSIS — D696 Thrombocytopenia, unspecified: Secondary | ICD-10-CM | POA: Insufficient documentation

## 2024-06-21 DIAGNOSIS — K7581 Nonalcoholic steatohepatitis (NASH): Secondary | ICD-10-CM | POA: Diagnosis not present

## 2024-06-21 DIAGNOSIS — Z7984 Long term (current) use of oral hypoglycemic drugs: Secondary | ICD-10-CM | POA: Diagnosis not present

## 2024-06-21 DIAGNOSIS — E119 Type 2 diabetes mellitus without complications: Secondary | ICD-10-CM | POA: Diagnosis not present

## 2024-06-21 DIAGNOSIS — I110 Hypertensive heart disease with heart failure: Secondary | ICD-10-CM | POA: Diagnosis not present

## 2024-06-21 LAB — CBC WITH DIFFERENTIAL/PLATELET
Abs Immature Granulocytes: 0.01 K/uL (ref 0.00–0.07)
Basophils Absolute: 0 K/uL (ref 0.0–0.1)
Basophils Relative: 1 %
Eosinophils Absolute: 0.1 K/uL (ref 0.0–0.5)
Eosinophils Relative: 4 %
HCT: 45 % (ref 39.0–52.0)
Hemoglobin: 15.6 g/dL (ref 13.0–17.0)
Immature Granulocytes: 0 %
Lymphocytes Relative: 26 %
Lymphs Abs: 0.8 K/uL (ref 0.7–4.0)
MCH: 32.4 pg (ref 26.0–34.0)
MCHC: 34.7 g/dL (ref 30.0–36.0)
MCV: 93.6 fL (ref 80.0–100.0)
Monocytes Absolute: 0.2 K/uL (ref 0.1–1.0)
Monocytes Relative: 8 %
Neutro Abs: 1.8 K/uL (ref 1.7–7.7)
Neutrophils Relative %: 61 %
Platelets: 69 K/uL — ABNORMAL LOW (ref 150–400)
RBC: 4.81 MIL/uL (ref 4.22–5.81)
RDW: 15.5 % (ref 11.5–15.5)
WBC: 2.9 K/uL — ABNORMAL LOW (ref 4.0–10.5)
nRBC: 0 % (ref 0.0–0.2)

## 2024-06-21 LAB — COMPREHENSIVE METABOLIC PANEL WITH GFR
ALT: 26 U/L (ref 0–44)
AST: 31 U/L (ref 15–41)
Albumin: 3.9 g/dL (ref 3.5–5.0)
Alkaline Phosphatase: 46 U/L (ref 38–126)
Anion gap: 9 (ref 5–15)
BUN: 15 mg/dL (ref 8–23)
CO2: 24 mmol/L (ref 22–32)
Calcium: 9.4 mg/dL (ref 8.9–10.3)
Chloride: 104 mmol/L (ref 98–111)
Creatinine, Ser: 1.22 mg/dL (ref 0.61–1.24)
GFR, Estimated: >60 mL/min (ref >60)
Glucose, Bld: 141 mg/dL — ABNORMAL HIGH (ref 70–99)
Potassium: 3.8 mmol/L (ref 3.5–5.1)
Sodium: 137 mmol/L (ref 135–145)
Total Bilirubin: 0.7 mg/dL (ref 0.0–1.2)
Total Protein: 7.5 g/dL (ref 6.5–8.1)

## 2024-06-21 LAB — FIBRINOGEN: Fibrinogen: 289 mg/dL (ref 210–475)

## 2024-06-23 DIAGNOSIS — I6310 Cerebral infarction due to embolism of unspecified precerebral artery: Secondary | ICD-10-CM | POA: Diagnosis not present

## 2024-06-23 LAB — BETA-2-GLYCOPROTEIN I ABS, IGG/M/A
Beta-2 Glyco I IgG: <9 GPI IgG units (ref 0–20)
Beta-2-Glycoprotein I IgA: <9 GPI IgA units (ref 0–25)
Beta-2-Glycoprotein I IgM: <9 GPI IgM units (ref 0–32)

## 2024-06-24 LAB — DRVVT MIX: dRVVT Mix: 46 s — ABNORMAL HIGH (ref 0.0–40.4)

## 2024-06-24 LAB — ANTITHROMBIN PANEL
AT III AG PPP IMM-ACNC: 102 % (ref 72–124)
Antithrombin Activity: 111 % (ref 75–135)

## 2024-06-24 LAB — LUPUS ANTICOAGULANT PANEL
DRVVT: 65.2 s — ABNORMAL HIGH (ref 0.0–47.0)
PTT Lupus Anticoagulant: 41.2 s (ref 0.0–43.5)

## 2024-06-24 LAB — PROTEIN C, TOTAL: Protein C, Total: 84 % (ref 60–150)

## 2024-06-24 LAB — DRVVT CONFIRM: dRVVT Confirm: 1 ratio (ref 0.8–1.2)

## 2024-06-24 LAB — PROTEIN S PANEL
Protein S Activity: 80 % (ref 63–140)
Protein S Ag, Free: 102 % (ref 61–136)
Protein S Ag, Total: 104 % (ref 60–150)

## 2024-06-25 DIAGNOSIS — I471 Supraventricular tachycardia, unspecified: Secondary | ICD-10-CM | POA: Diagnosis not present

## 2024-06-26 ENCOUNTER — Encounter (HOSPITAL_COMMUNITY): Payer: Self-pay | Admitting: Oncology

## 2024-06-26 LAB — COAG STUDIES INTERP REPORT

## 2024-06-26 LAB — VON WILLEBRAND PANEL
Coagulation Factor VIII: 241 % — ABNORMAL HIGH (ref 56–140)
Ristocetin Co-factor, Plasma: >300 % — ABNORMAL HIGH (ref 50–200)
Von Willebrand Antigen, Plasma: 411 % — ABNORMAL HIGH (ref 50–200)

## 2024-06-26 LAB — PROTHROMBIN GENE MUTATION

## 2024-06-29 DIAGNOSIS — I2510 Atherosclerotic heart disease of native coronary artery without angina pectoris: Secondary | ICD-10-CM | POA: Diagnosis not present

## 2024-06-29 DIAGNOSIS — I110 Hypertensive heart disease with heart failure: Secondary | ICD-10-CM | POA: Diagnosis not present

## 2024-06-29 DIAGNOSIS — G43909 Migraine, unspecified, not intractable, without status migrainosus: Secondary | ICD-10-CM | POA: Diagnosis not present

## 2024-06-29 DIAGNOSIS — K7460 Unspecified cirrhosis of liver: Secondary | ICD-10-CM | POA: Diagnosis not present

## 2024-06-29 DIAGNOSIS — Z951 Presence of aortocoronary bypass graft: Secondary | ICD-10-CM | POA: Diagnosis not present

## 2024-06-29 DIAGNOSIS — Z7984 Long term (current) use of oral hypoglycemic drugs: Secondary | ICD-10-CM | POA: Diagnosis not present

## 2024-06-29 DIAGNOSIS — E785 Hyperlipidemia, unspecified: Secondary | ICD-10-CM | POA: Diagnosis not present

## 2024-06-29 DIAGNOSIS — K7581 Nonalcoholic steatohepatitis (NASH): Secondary | ICD-10-CM | POA: Diagnosis not present

## 2024-06-29 DIAGNOSIS — I5032 Chronic diastolic (congestive) heart failure: Secondary | ICD-10-CM | POA: Diagnosis not present

## 2024-06-29 DIAGNOSIS — Z7901 Long term (current) use of anticoagulants: Secondary | ICD-10-CM | POA: Diagnosis not present

## 2024-06-29 DIAGNOSIS — K3189 Other diseases of stomach and duodenum: Secondary | ICD-10-CM | POA: Diagnosis not present

## 2024-06-29 DIAGNOSIS — K219 Gastro-esophageal reflux disease without esophagitis: Secondary | ICD-10-CM | POA: Diagnosis not present

## 2024-06-29 DIAGNOSIS — I67841 Reversible cerebrovascular vasoconstriction syndrome: Secondary | ICD-10-CM | POA: Diagnosis not present

## 2024-06-29 DIAGNOSIS — Z86711 Personal history of pulmonary embolism: Secondary | ICD-10-CM | POA: Diagnosis not present

## 2024-06-29 DIAGNOSIS — D696 Thrombocytopenia, unspecified: Secondary | ICD-10-CM | POA: Diagnosis not present

## 2024-06-29 DIAGNOSIS — M5430 Sciatica, unspecified side: Secondary | ICD-10-CM | POA: Diagnosis not present

## 2024-06-29 DIAGNOSIS — I252 Old myocardial infarction: Secondary | ICD-10-CM | POA: Diagnosis not present

## 2024-06-29 DIAGNOSIS — J45909 Unspecified asthma, uncomplicated: Secondary | ICD-10-CM | POA: Diagnosis not present

## 2024-06-29 DIAGNOSIS — E119 Type 2 diabetes mellitus without complications: Secondary | ICD-10-CM | POA: Diagnosis not present

## 2024-07-03 DIAGNOSIS — I69892 Facial weakness following other cerebrovascular disease: Secondary | ICD-10-CM | POA: Diagnosis not present

## 2024-07-03 DIAGNOSIS — D696 Thrombocytopenia, unspecified: Secondary | ICD-10-CM | POA: Diagnosis not present

## 2024-07-03 DIAGNOSIS — I67841 Reversible cerebrovascular vasoconstriction syndrome: Secondary | ICD-10-CM | POA: Diagnosis not present

## 2024-07-03 DIAGNOSIS — G43909 Migraine, unspecified, not intractable, without status migrainosus: Secondary | ICD-10-CM | POA: Diagnosis not present

## 2024-07-03 DIAGNOSIS — E119 Type 2 diabetes mellitus without complications: Secondary | ICD-10-CM | POA: Diagnosis not present

## 2024-07-03 DIAGNOSIS — K7581 Nonalcoholic steatohepatitis (NASH): Secondary | ICD-10-CM | POA: Diagnosis not present

## 2024-07-03 DIAGNOSIS — I2510 Atherosclerotic heart disease of native coronary artery without angina pectoris: Secondary | ICD-10-CM | POA: Diagnosis not present

## 2024-07-03 DIAGNOSIS — K3189 Other diseases of stomach and duodenum: Secondary | ICD-10-CM | POA: Diagnosis not present

## 2024-07-03 DIAGNOSIS — I110 Hypertensive heart disease with heart failure: Secondary | ICD-10-CM | POA: Diagnosis not present

## 2024-07-03 DIAGNOSIS — K7460 Unspecified cirrhosis of liver: Secondary | ICD-10-CM | POA: Diagnosis not present

## 2024-07-03 DIAGNOSIS — I5032 Chronic diastolic (congestive) heart failure: Secondary | ICD-10-CM | POA: Diagnosis not present

## 2024-07-03 DIAGNOSIS — J45909 Unspecified asthma, uncomplicated: Secondary | ICD-10-CM | POA: Diagnosis not present

## 2024-07-05 DIAGNOSIS — I67841 Reversible cerebrovascular vasoconstriction syndrome: Secondary | ICD-10-CM | POA: Diagnosis not present

## 2024-07-05 DIAGNOSIS — I63139 Cerebral infarction due to embolism of unspecified carotid artery: Secondary | ICD-10-CM | POA: Diagnosis not present

## 2024-07-05 DIAGNOSIS — I6310 Cerebral infarction due to embolism of unspecified precerebral artery: Secondary | ICD-10-CM | POA: Diagnosis not present

## 2024-07-05 DIAGNOSIS — E782 Mixed hyperlipidemia: Secondary | ICD-10-CM | POA: Diagnosis not present

## 2024-07-06 ENCOUNTER — Ambulatory Visit: Attending: Cardiology | Admitting: Cardiology

## 2024-07-06 ENCOUNTER — Encounter: Payer: Self-pay | Admitting: Cardiology

## 2024-07-06 VITALS — BP 108/60 | HR 69 | Ht 69.0 in | Wt 197.8 lb

## 2024-07-06 DIAGNOSIS — G43909 Migraine, unspecified, not intractable, without status migrainosus: Secondary | ICD-10-CM | POA: Diagnosis not present

## 2024-07-06 DIAGNOSIS — M5430 Sciatica, unspecified side: Secondary | ICD-10-CM | POA: Diagnosis not present

## 2024-07-06 DIAGNOSIS — I110 Hypertensive heart disease with heart failure: Secondary | ICD-10-CM | POA: Diagnosis not present

## 2024-07-06 DIAGNOSIS — Z86711 Personal history of pulmonary embolism: Secondary | ICD-10-CM | POA: Diagnosis not present

## 2024-07-06 DIAGNOSIS — E782 Mixed hyperlipidemia: Secondary | ICD-10-CM | POA: Diagnosis not present

## 2024-07-06 DIAGNOSIS — I25119 Atherosclerotic heart disease of native coronary artery with unspecified angina pectoris: Secondary | ICD-10-CM

## 2024-07-06 DIAGNOSIS — E785 Hyperlipidemia, unspecified: Secondary | ICD-10-CM | POA: Diagnosis not present

## 2024-07-06 DIAGNOSIS — I67841 Reversible cerebrovascular vasoconstriction syndrome: Secondary | ICD-10-CM | POA: Diagnosis not present

## 2024-07-06 DIAGNOSIS — I2510 Atherosclerotic heart disease of native coronary artery without angina pectoris: Secondary | ICD-10-CM | POA: Diagnosis not present

## 2024-07-06 DIAGNOSIS — E119 Type 2 diabetes mellitus without complications: Secondary | ICD-10-CM | POA: Diagnosis not present

## 2024-07-06 DIAGNOSIS — D696 Thrombocytopenia, unspecified: Secondary | ICD-10-CM | POA: Diagnosis not present

## 2024-07-06 DIAGNOSIS — I69892 Facial weakness following other cerebrovascular disease: Secondary | ICD-10-CM | POA: Diagnosis not present

## 2024-07-06 DIAGNOSIS — K3189 Other diseases of stomach and duodenum: Secondary | ICD-10-CM | POA: Diagnosis not present

## 2024-07-06 DIAGNOSIS — I252 Old myocardial infarction: Secondary | ICD-10-CM | POA: Diagnosis not present

## 2024-07-06 DIAGNOSIS — Z8673 Personal history of transient ischemic attack (TIA), and cerebral infarction without residual deficits: Secondary | ICD-10-CM

## 2024-07-06 DIAGNOSIS — I5032 Chronic diastolic (congestive) heart failure: Secondary | ICD-10-CM | POA: Diagnosis not present

## 2024-07-06 DIAGNOSIS — Z7901 Long term (current) use of anticoagulants: Secondary | ICD-10-CM | POA: Diagnosis not present

## 2024-07-06 DIAGNOSIS — J45909 Unspecified asthma, uncomplicated: Secondary | ICD-10-CM | POA: Diagnosis not present

## 2024-07-06 DIAGNOSIS — K219 Gastro-esophageal reflux disease without esophagitis: Secondary | ICD-10-CM | POA: Diagnosis not present

## 2024-07-06 DIAGNOSIS — Z951 Presence of aortocoronary bypass graft: Secondary | ICD-10-CM | POA: Diagnosis not present

## 2024-07-06 DIAGNOSIS — Z7984 Long term (current) use of oral hypoglycemic drugs: Secondary | ICD-10-CM | POA: Diagnosis not present

## 2024-07-06 DIAGNOSIS — K7460 Unspecified cirrhosis of liver: Secondary | ICD-10-CM | POA: Diagnosis not present

## 2024-07-06 DIAGNOSIS — K7581 Nonalcoholic steatohepatitis (NASH): Secondary | ICD-10-CM | POA: Diagnosis not present

## 2024-07-06 MED ORDER — CARVEDILOL 3.125 MG PO TABS: 3.1250 mg | ORAL_TABLET | Freq: Two times a day (BID) | ORAL | 6 refills | Status: AC

## 2024-07-06 NOTE — Patient Instructions (Addendum)
 Medication Instructions:  Your physician has recommended you make the following change in your medication:  Decrease carvedilol  to 3.125 mg twice daily. You may break your 6.25 mg tablets in 1/2 twice daily until they are finished. Continue all other medications as prescribed  Labwork: none  Testing/Procedures: none  Follow-Up: Your physician recommends that you schedule a follow-up appointment in: 6 months  Any Other Special Instructions Will Be Listed Below (If Applicable).  If you need a refill on your cardiac medications before your next appointment, please call your pharmacy.

## 2024-07-06 NOTE — Progress Notes (Signed)
 Cardiology Office Note  Date: 07/06/2024   ID: Douglas, Smolinsky 03-Oct-1956, MRN [id number]  History of Present Illness: [name] [name] is a 68 y.o. male last seen in November 2024 by Ms. Miriam NP, I reviewed her note.  I last saw him in December 2023.  He is here today with his son for a follow-up visit.  I reviewed substantial interval records.  He was most recently hospitalized at [facility] with cryptogenic stroke, multifocal and felt to be likely cardioembolic although I do not see that any atrial fibrillation was documented including by subsequent outpatient cardiac monitor.  Follow-up echocardiogram obtained revealing LVEF 55 to 60%.  He had a follow-up visit with neurology yesterday, I reviewed the note.  He does not report any increasing angina, no sense of palpitations.  Stable NYHA class II dyspnea.  Does feel somewhat lightheaded when he stands up quickly, has had no syncope.  Blood pressure has been running low normal range most recently.  We went over his medications and clarified doses with pharmacy.  He tells me that he stopped taking [name]  due to recurring diarrhea.  Otherwise his cardiac regimen remains stable.  Plan to reduce Coreg  dosing for now.  I did review his interval lab work, recent LDL 59.  Physical Exam: VS:  BP 108/60 (BP Location: Left Arm)   Pulse 69   Ht 5' 9 (1.753 m)   Wt 197 lb 12.8 oz (89.7 kg)   SpO2 97%   BMI 29.21 kg/m , BMI Body mass index is 29.21 kg/m.  Wt Readings from Last 3 Encounters:  07/06/24 197 lb 12.8 oz (89.7 kg)  06/21/24 197 lb 15.6 oz (89.8 kg)  06/20/24 200 lb 3.2 oz (90.8 kg)    General: Patient appears comfortable at rest. HEENT: Conjunctiva and lids normal. Neck: Supple, no elevated JVP or carotid bruits. Lungs: Clear to auscultation, nonlabored breathing at rest. Cardiac: Regular rate and rhythm, no S3 or significant systolic murmur. Extremities: No pitting edema.  ECG:  An ECG dated 03/31/2024 was personally  reviewed today and demonstrated:  Sinus rhythm with nonspecific T wave changes.  Labwork: 02/15/2024: TSH 2.380 06/21/2024: ALT 26; AST 31; BUN 15; Creatinine, Ser 1.22; Hemoglobin 15.6; Platelets 69; Potassium 3.8; Sodium 137     Component Value Date/Time   CHOL 154 02/15/2024 1332   TRIG 145 02/15/2024 1332   HDL 37 (L) 02/15/2024 1332   CHOLHDL 4.2 02/15/2024 1332   CHOLHDL 2.7 02/02/2023 0555   VLDL 19 02/02/2023 0555   LDLCALC 91 02/15/2024 1332  July 2025: Cholesterol 117, TG 101, HDL 40, LDL 59   Other Studies Reviewed Today:  Echocardiogram 05/29/2024 ([facility]): SUMMARY The left ventricular size is normal. Left ventricular systolic function is normal. LV ejection fraction = 55-60%. The right ventricle is mildly dilated. The right ventricular systolic function is normal. The left atrium is mildly dilated. There is aortic valve sclerosis. There is mild to moderate aortic regurgitation. There is mild tricuspid regurgitation. Estimated right ventricular systolic pressure is 23 mmHg. The inferior vena cava was not visualized during the exam. There is no pericardial effusion.   Assessment and Plan:  1.  Multivessel CAD status post CABG in 2017 with LIMA to LAD, free RIMA to OM 2, and SVG to PDA.  Follow-up cardiac catheterization in October 2023 demonstrated patent bypass grafts.  Recent follow-up echocardiogram at [facility] revealed LVEF 55 to 60%.  He does not report any increasing angina at this  time.  Currently on fark [employer] 5 mg daily, Pravachol  20 mg daily, Zetia  10 mg daily, Imdur  30 mg twice daily, and as needed nitroglycerin .  Not on aspirin  given concurrent use of Eliquis .   2.  Mixed hyperlipidemia.  He has a history of statin myalgias, has tolerated Pravachol  20 mg daily along with Zetia  10 mg daily.  Recent LDL 59.   3.  Primary hypertension.  Running low normal blood pressure most recently with some orthostatic symptoms.  Decrease Coreg  to 3.125 mg twice  daily for now and continue to observe.  4.  History of stroke as discussed above.  Continue Eliquis  5 mg twice daily, he had already been anticoagulated with history of unprovoked DVT/pulmonary embolus.  He does follow with oncology, most likely would plan on long-term anticoagulation as long as he tolerates.  Disposition:  Follow up 6 months.  Signed, Jayson JUDITHANN Sierras, M.D., F.A.C.C. [facility]

## 2024-07-07 ENCOUNTER — Inpatient Hospital Stay: Admitting: Oncology

## 2024-07-07 DIAGNOSIS — D696 Thrombocytopenia, unspecified: Secondary | ICD-10-CM

## 2024-07-07 DIAGNOSIS — R4182 Altered mental status, unspecified: Secondary | ICD-10-CM | POA: Insufficient documentation

## 2024-07-07 DIAGNOSIS — I82409 Acute embolism and thrombosis of unspecified deep veins of unspecified lower extremity: Secondary | ICD-10-CM | POA: Insufficient documentation

## 2024-07-07 NOTE — Assessment & Plan Note (Addendum)
-   He has evidence of cirrhosis with splenomegaly on CT scan. - B12, MMA and folic acid  levels on 01/24/2024 is normal.  -CBC shows stable persistently low white blood cell count 2.9 and low platelets 69,000. -She denies any bleeding. -As long as platelets are above 50,000, continue Eliquis . - He follows up with GI service for cirrhosis.

## 2024-07-07 NOTE — Assessment & Plan Note (Addendum)
-   He is tolerating Eliquis  very well.  Denies any bleeding issues or easy bruising. 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 - I have recommended continuing anticoagulation at this time as benefits outweigh risks. - Because of his thrombocytopenia, will closely monitor.   -Coagulopathy workup was essentially unremarkable.  Negative lupus anticoagulant. -RTC 6 months with repeat CBC and risk-benefit analysis to continue anticoagulation.  He is tolerating Eliquis  well.

## 2024-07-07 NOTE — Progress Notes (Signed)
 [facility] OFFICE PROGRESS NOTE  Bacchus, Meade PEDLAR, FNP  ASSESSMENT & PLAN:    Assessment & Plan Deep vein thrombosis (DVT) of lower extremity, unspecified chronicity, unspecified laterality, unspecified vein (HCC) - He is tolerating Eliquis  very well.  Denies any bleeding issues or easy bruising. 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 - I have recommended continuing anticoagulation at this time as benefits outweigh risks. - Because of his thrombocytopenia, will closely monitor.   -Coagulopathy workup was essentially unremarkable.  Negative lupus anticoagulant. -RTC 6 months with repeat CBC and risk-benefit analysis to continue anticoagulation.  He is tolerating Eliquis  well. Thrombocytopenia - He has evidence of cirrhosis with splenomegaly on CT scan. - B12, MMA and folic acid  levels on 01/24/2024 is normal.  -CBC shows stable persistently low white blood cell count 2.9 and low platelets 69,000. -She denies any bleeding. -As long as platelets are above 50,000, continue Eliquis . - He follows up with GI service for cirrhosis. Altered mental status, unspecified altered mental status type -Patient had recent hospitalization for encephalopathy and stroke rule out. -Per imaging and MD evaluation at discharge, no clot was found.  -He was discharged on Eliquis  but encouraged to have hypercoagulable workup. -Previously hypercoagulable workup was negative.. -Discussed with Dr. Davonna continuing Eliquis . 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 -Continue Eliquis .  Orders Placed This Encounter  Procedures   CBC with Differential    Standing Status:   Future    Expected Date:   01/04/2025    Expiration Date:   10/24/2025   Comprehensive metabolic panel    Standing Status:   Future    Expected Date:   10/24/2024    Expiration Date:   01/04/2025   D-dimer, quantitative    Standing  Status:   Future    Expected Date:   01/04/2025    Expiration Date:   10/24/2025   INTERVAL HISTORY: Patient returns for follow-up and to review most recent labs.  At his last visit, we discussed possibly switching anticoagulation given we are uncertain if he had had an actual CVA.   After reviewing medical records from his recent inpatient stay, we concluded that there was no CVA.  We reviewed labs from 06/21/24.  SUMMARY OF HEMATOLOGIC HISTORY: Oncology History   No problem history exists.     CBC    Component Value Date/Time   WBC 2.9 (L) 06/21/2024 1048   RBC 4.81 06/21/2024 1048   HGB 15.6 06/21/2024 1048   HGB 16.1 04/07/2024 1029   HCT 45.0 06/21/2024 1048   HCT 47.0 04/07/2024 1029   PLT 69 (L) 06/21/2024 1048   PLT 71 (LL) 04/07/2024 1029   MCV 93.6 06/21/2024 1048   MCV 93 04/07/2024 1029   MCH 32.4 06/21/2024 1048   MCHC 34.7 06/21/2024 1048   RDW 15.5 06/21/2024 1048   RDW 13.6 04/07/2024 1029   LYMPHSABS 0.8 06/21/2024 1048   LYMPHSABS 0.7 04/07/2024 1029   MONOABS 0.2 06/21/2024 1048   EOSABS 0.1 06/21/2024 1048   EOSABS 0.1 04/07/2024 1029   BASOSABS 0.0 06/21/2024 1048   BASOSABS 0.0 04/07/2024 1029       Latest Ref Rng & Units 06/21/2024   10:48 AM 04/07/2024   10:29 AM 03/31/2024    5:18 PM  CMP  Glucose 70 - 99 mg/dL 858  884  878   BUN 8 - 23 mg/dL 15  18  21  Creatinine 0.61 - 1.24 mg/dL 8.77  8.90  8.72   Sodium 135 - 145 mmol/L 137  141  133   Potassium 3.5 - 5.1 mmol/L 3.8  4.1  3.5   Chloride 98 - 111 mmol/L 104  105  108   CO2 22 - 32 mmol/L 24  19  20    Calcium  8.9 - 10.3 mg/dL 9.4  9.2  7.9   Total Protein 6.5 - 8.1 g/dL 7.5   6.1   Total Bilirubin 0.0 - 1.2 mg/dL 0.7   0.9   Alkaline Phos 38 - 126 U/L 46   48   AST 15 - 41 U/L 31   30   ALT 0 - 44 U/L 26   29      Lab Results  Component Value Date   FERRITIN 27 08/20/2021   VITAMINB12 319 02/15/2024    There were no vitals filed for this visit.  Review of  System:  Review of Systems  Constitutional:  Positive for malaise/fatigue.    Physical Exam: Physical Exam Constitutional:      Appearance: Normal appearance.  HENT:     Head: Normocephalic and atraumatic.  Eyes:     Pupils: Pupils are equal, round, and reactive to light.  Cardiovascular:     Rate and Rhythm: Normal rate and regular rhythm.     Heart sounds: Normal heart sounds. No murmur heard. Pulmonary:     Effort: Pulmonary effort is normal.     Breath sounds: Normal breath sounds. No wheezing.  Abdominal:     General: Bowel sounds are normal. There is no distension.     Palpations: Abdomen is soft.     Tenderness: There is no abdominal tenderness.  Musculoskeletal:        General: Normal range of motion.     Cervical back: Normal range of motion.  Skin:    General: Skin is warm and dry.     Findings: No rash.  Neurological:     Mental Status: He is alert and oriented to person, place, and time.     Gait: Gait is intact.  Psychiatric:        Mood and Affect: Mood and affect normal.        Cognition and Memory: Memory normal.        Judgment: Judgment normal.      I spent 20 minutes dedicated to the care of this patient (face-to-face and non-face-to-face) on the date of the encounter to include what is described in the assessment and plan.,  Delon Hope, NP 10/24/2024 8:07 AM

## 2024-07-07 NOTE — Assessment & Plan Note (Addendum)
-  Patient had recent hospitalization for encephalopathy and stroke rule out. -Per imaging and MD evaluation at discharge, no clot was found.  -He was discharged on Eliquis  but encouraged to have hypercoagulable workup. -Previously hypercoagulable workup was negative.. -Discussed with Dr. Davonna continuing Eliquis .

## 2024-07-11 ENCOUNTER — Other Ambulatory Visit: Payer: Self-pay | Admitting: Internal Medicine

## 2024-07-11 DIAGNOSIS — Z7901 Long term (current) use of anticoagulants: Secondary | ICD-10-CM | POA: Diagnosis not present

## 2024-07-11 DIAGNOSIS — I2510 Atherosclerotic heart disease of native coronary artery without angina pectoris: Secondary | ICD-10-CM | POA: Diagnosis not present

## 2024-07-11 DIAGNOSIS — K3189 Other diseases of stomach and duodenum: Secondary | ICD-10-CM | POA: Diagnosis not present

## 2024-07-11 DIAGNOSIS — Z86711 Personal history of pulmonary embolism: Secondary | ICD-10-CM | POA: Diagnosis not present

## 2024-07-11 DIAGNOSIS — K7581 Nonalcoholic steatohepatitis (NASH): Secondary | ICD-10-CM | POA: Diagnosis not present

## 2024-07-11 DIAGNOSIS — Z7984 Long term (current) use of oral hypoglycemic drugs: Secondary | ICD-10-CM | POA: Diagnosis not present

## 2024-07-11 DIAGNOSIS — E785 Hyperlipidemia, unspecified: Secondary | ICD-10-CM | POA: Diagnosis not present

## 2024-07-11 DIAGNOSIS — E1100 Type 2 diabetes mellitus with hyperosmolarity without nonketotic hyperglycemic-hyperosmolar coma (NKHHC): Secondary | ICD-10-CM

## 2024-07-11 DIAGNOSIS — I252 Old myocardial infarction: Secondary | ICD-10-CM | POA: Diagnosis not present

## 2024-07-11 DIAGNOSIS — M5430 Sciatica, unspecified side: Secondary | ICD-10-CM | POA: Diagnosis not present

## 2024-07-11 DIAGNOSIS — Z951 Presence of aortocoronary bypass graft: Secondary | ICD-10-CM | POA: Diagnosis not present

## 2024-07-11 DIAGNOSIS — K219 Gastro-esophageal reflux disease without esophagitis: Secondary | ICD-10-CM | POA: Diagnosis not present

## 2024-07-11 DIAGNOSIS — I110 Hypertensive heart disease with heart failure: Secondary | ICD-10-CM | POA: Diagnosis not present

## 2024-07-11 DIAGNOSIS — E119 Type 2 diabetes mellitus without complications: Secondary | ICD-10-CM | POA: Diagnosis not present

## 2024-07-11 DIAGNOSIS — I69892 Facial weakness following other cerebrovascular disease: Secondary | ICD-10-CM | POA: Diagnosis not present

## 2024-07-11 DIAGNOSIS — I5032 Chronic diastolic (congestive) heart failure: Secondary | ICD-10-CM | POA: Diagnosis not present

## 2024-07-11 DIAGNOSIS — K7460 Unspecified cirrhosis of liver: Secondary | ICD-10-CM | POA: Diagnosis not present

## 2024-07-11 DIAGNOSIS — G43909 Migraine, unspecified, not intractable, without status migrainosus: Secondary | ICD-10-CM | POA: Diagnosis not present

## 2024-07-11 DIAGNOSIS — D696 Thrombocytopenia, unspecified: Secondary | ICD-10-CM | POA: Diagnosis not present

## 2024-07-11 DIAGNOSIS — J45909 Unspecified asthma, uncomplicated: Secondary | ICD-10-CM | POA: Diagnosis not present

## 2024-07-13 ENCOUNTER — Encounter (INDEPENDENT_AMBULATORY_CARE_PROVIDER_SITE_OTHER): Payer: Self-pay | Admitting: Gastroenterology

## 2024-07-17 ENCOUNTER — Encounter: Payer: Self-pay | Admitting: Family Medicine

## 2024-07-17 ENCOUNTER — Other Ambulatory Visit: Payer: Self-pay | Admitting: Internal Medicine

## 2024-07-19 ENCOUNTER — Other Ambulatory Visit: Payer: Self-pay | Admitting: Internal Medicine

## 2024-07-19 DIAGNOSIS — E1100 Type 2 diabetes mellitus with hyperosmolarity without nonketotic hyperglycemic-hyperosmolar coma (NKHHC): Secondary | ICD-10-CM

## 2024-07-19 DIAGNOSIS — E1129 Type 2 diabetes mellitus with other diabetic kidney complication: Secondary | ICD-10-CM

## 2024-07-24 ENCOUNTER — Ambulatory Visit: Payer: Self-pay

## 2024-07-24 NOTE — Telephone Encounter (Signed)
 Please call patient, does not always check MyChart. He reports he had the diarrhea on the 5mg  as well. Requested he call back if BS >300 or if he develops symptoms prior to contact.  FYI Only or Action Required?: Action required by provider: update on patient condition.  Patient was last seen in primary care on 06/20/2024 by Tobie Suzzane POUR, MD.  Called Nurse Triage reporting Blood Sugar Problem.  Symptoms began about a month ago.  Interventions attempted: Nothing.  Symptoms are: unchanged.  Triage Disposition: Discuss With PCP and Callback by Nurse Today (overriding See Physician Within 24 Hours)  Patient/caregiver understands and will follow disposition?:    Reason for Disposition  [1] Symptoms of high blood sugar (e.g., increased thirst, frequent urination, weight loss) AND [2] not able to test blood glucose  Answer Assessment - Initial Assessment Questions Pt reports elevated BS x 2 weeks (ave 250). Patient stopped [name]  one month ago d/t GI discomfort.   1. BLOOD GLUCOSE: What is your blood glucose level?      250  2. ONSET: When did you check the blood glucose?     One month  3. TYPE 1 or 2:  Do you know what type of diabetes you have?  (e.g., Type 1, Type 2, Gestational; doesn't know)      2  4. INSULIN : Do you take insulin ? What type of insulin (s) do you use? What is the mode of delivery? (syringe, pen; injection or pump)?      No, was on [name]   5. OTHER SYMPTOMS: Do you have any symptoms? (e.g., fever, frequent urination, difficulty breathing, dizziness, weakness, vomiting)     Denies  Protocols used: Diabetes - High Blood Sugar-A-AH Copied from CRM [id number]. Topic: Clinical - Red Word Triage >> Jul 24, 2024 11:57 AM Gustabo D wrote: Blood sugar has been high. The monjuro hurts his stomach so he hasn't been taking anything for his diabetes for at least a month it's been very high up to 250

## 2024-07-25 ENCOUNTER — Telehealth: Payer: Self-pay

## 2024-07-25 DIAGNOSIS — K7460 Unspecified cirrhosis of liver: Secondary | ICD-10-CM | POA: Diagnosis not present

## 2024-07-25 DIAGNOSIS — G43909 Migraine, unspecified, not intractable, without status migrainosus: Secondary | ICD-10-CM | POA: Diagnosis not present

## 2024-07-25 DIAGNOSIS — K3189 Other diseases of stomach and duodenum: Secondary | ICD-10-CM | POA: Diagnosis not present

## 2024-07-25 DIAGNOSIS — M5430 Sciatica, unspecified side: Secondary | ICD-10-CM | POA: Diagnosis not present

## 2024-07-25 DIAGNOSIS — I67841 Reversible cerebrovascular vasoconstriction syndrome: Secondary | ICD-10-CM | POA: Diagnosis not present

## 2024-07-25 DIAGNOSIS — I2510 Atherosclerotic heart disease of native coronary artery without angina pectoris: Secondary | ICD-10-CM | POA: Diagnosis not present

## 2024-07-25 DIAGNOSIS — K7581 Nonalcoholic steatohepatitis (NASH): Secondary | ICD-10-CM | POA: Diagnosis not present

## 2024-07-25 DIAGNOSIS — D696 Thrombocytopenia, unspecified: Secondary | ICD-10-CM | POA: Diagnosis not present

## 2024-07-25 DIAGNOSIS — I252 Old myocardial infarction: Secondary | ICD-10-CM | POA: Diagnosis not present

## 2024-07-25 DIAGNOSIS — J45909 Unspecified asthma, uncomplicated: Secondary | ICD-10-CM | POA: Diagnosis not present

## 2024-07-25 DIAGNOSIS — E785 Hyperlipidemia, unspecified: Secondary | ICD-10-CM | POA: Diagnosis not present

## 2024-07-25 DIAGNOSIS — K219 Gastro-esophageal reflux disease without esophagitis: Secondary | ICD-10-CM | POA: Diagnosis not present

## 2024-07-25 DIAGNOSIS — E119 Type 2 diabetes mellitus without complications: Secondary | ICD-10-CM | POA: Diagnosis not present

## 2024-07-25 DIAGNOSIS — I110 Hypertensive heart disease with heart failure: Secondary | ICD-10-CM | POA: Diagnosis not present

## 2024-07-25 DIAGNOSIS — Z7984 Long term (current) use of oral hypoglycemic drugs: Secondary | ICD-10-CM | POA: Diagnosis not present

## 2024-07-25 DIAGNOSIS — I5032 Chronic diastolic (congestive) heart failure: Secondary | ICD-10-CM | POA: Diagnosis not present

## 2024-07-25 DIAGNOSIS — Z7901 Long term (current) use of anticoagulants: Secondary | ICD-10-CM | POA: Diagnosis not present

## 2024-07-25 DIAGNOSIS — I69892 Facial weakness following other cerebrovascular disease: Secondary | ICD-10-CM | POA: Diagnosis not present

## 2024-07-25 DIAGNOSIS — Z951 Presence of aortocoronary bypass graft: Secondary | ICD-10-CM | POA: Diagnosis not present

## 2024-07-25 DIAGNOSIS — Z86711 Personal history of pulmonary embolism: Secondary | ICD-10-CM | POA: Diagnosis not present

## 2024-07-25 NOTE — Telephone Encounter (Signed)
 Copied from CRM [id number]. Topic: Appointments - Appointment Scheduling >> Jul 25, 2024  2:21 PM Geneva B wrote: Patient/patient representative is calling to schedule an appointment. Refer to attachments for appointment information.  Patient is calling to see if he can get a note to return back to work asap please call patient [phone] (H)

## 2024-07-26 ENCOUNTER — Encounter: Payer: Self-pay | Admitting: Internal Medicine

## 2024-07-26 ENCOUNTER — Ambulatory Visit (INDEPENDENT_AMBULATORY_CARE_PROVIDER_SITE_OTHER): Admitting: Internal Medicine

## 2024-07-26 VITALS — BP 100/58 | HR 74 | Ht 69.0 in | Wt 200.9 lb

## 2024-07-26 DIAGNOSIS — I67841 Reversible cerebrovascular vasoconstriction syndrome: Secondary | ICD-10-CM

## 2024-07-26 DIAGNOSIS — Z7689 Persons encountering health services in other specified circumstances: Secondary | ICD-10-CM | POA: Insufficient documentation

## 2024-07-26 DIAGNOSIS — Z8673 Personal history of transient ischemic attack (TIA), and cerebral infarction without residual deficits: Secondary | ICD-10-CM | POA: Diagnosis not present

## 2024-07-26 DIAGNOSIS — E1169 Type 2 diabetes mellitus with other specified complication: Secondary | ICD-10-CM

## 2024-07-26 DIAGNOSIS — I10 Essential (primary) hypertension: Secondary | ICD-10-CM

## 2024-07-26 MED ORDER — METFORMIN HCL ER 500 MG PO TB24: 500.0000 mg | ORAL_TABLET | Freq: Two times a day (BID) | ORAL | 1 refills | Status: DC

## 2024-07-26 NOTE — Assessment & Plan Note (Addendum)
 Lab Results  Component Value Date   HGBA1C 7.6 (H) 02/15/2024   Uncontrolled Associated with HTN, HLD and CVA Has stopped taking [name]  due to GI side effects, would prefer to restart it or Ozempic after GI evaluation On Farxiga  5 mg QD Started metformin  500 mg twice daily for now Advised to follow diabetic diet On statin

## 2024-07-26 NOTE — Assessment & Plan Note (Addendum)
 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

## 2024-07-26 NOTE — Assessment & Plan Note (Signed)
 Works as a Electrical engineer at [employer] Has recently completed PT after RCVS/CVA Return to work note provided

## 2024-07-26 NOTE — Progress Notes (Signed)
 Established Patient Office Visit  Subjective:  Patient ID: [name] [name], male    DOB: [birth year]  Age: 68 y.o. MRN: [id number]  CC:  Chief Complaint  Patient presents with   Diabetes    Reports mounjaro  has been causing abdominal pain and has d/c the medication.    Letter for School/Work    Would like a work note for next week to return to work.     HPI [name] [name] is a 68 y.o. male with past medical history of HTN, CAD, CVA, PE, type II DM, HLD and OSA who presents for evaluation prior to returning to work.  He was admitted at [facility] for reversible cerebrovascular vasoconstriction syndrome in 07/25.  He had neurology evaluation and recently completed PT.  He has noticed improvement in LE weakness.  Denies any episode of dizziness, chest pain or palpitations currently.  BP is WNL.  He takes carvedilol  6.25 mg twice daily and Imdur  30 mg BID currently. He is followed by cardiology, takes Eliquis  currently for history of PE. He has a history of CVA, is currently taking statin and Zetia .   Type II DM: He has stopped taking [name]  since recent hospitalization as he was told that gastric emphysema was due to [name] .  He still has not scheduled GI follow-up, but agrees to schedule it soon.  He currently takes Farxiga  5 mg QD.  He has CGM, which shows hyperglycemia up to 250s in the last 4 weeks, although average blood glucose is 160 in the last 30 days.   Past Medical History:  Diagnosis Date   Anxiety    Aortic insufficiency    Arthritis    Asthma    Cataract    Right eye   Chronic lower back pain    Cirrhosis of liver (HCC)    Coronary atherosclerosis of native coronary artery    a. 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   LIMA to LAD, free RIMA to OM2, SVG to PDA, open SVG harvest from right thigh   Stroke (HCC)    Thrombocytopenia (HCC)    TIA (transient ischemic attack)    Type 2 diabetes mellitus (HCC)     Past Surgical History:  Procedure Laterality Date   ANTERIOR CERVICAL DECOMP/DISCECTOMY FUSION  1998   C3-4   CARDIAC CATHETERIZATION  several   CARDIAC CATHETERIZATION N/A 12/30/2015   Procedure: Left Heart Cath and Coronary Angiography;  Surgeon: Dorn JINNY Lesches, MD;  Location: [facility] INVASIVE CV LAB;  Service: Cardiovascular;  Laterality: N/A;   CARPAL TUNNEL RELEASE Bilateral 2005   CHOLECYSTECTOMY N/A 11/28/2018   Procedure: LAPAROSCOPIC CHOLECYSTECTOMY;  Surgeon: Kallie Manuelita BROCKS, MD;  Location: [facility] ORS;  Service: General;  Laterality: N/A;   COLONOSCOPY     COLONOSCOPY WITH PROPOFOL  N/A 09/27/2020   Procedure: COLONOSCOPY WITH PROPOFOL ;  Surgeon: Eartha Angelia Sieving, MD;  Location: [facility] ENDO SUITE;  Service: Gastroenterology;  Laterality: N/A;   COLONOSCOPY WITH PROPOFOL  N/A 06/29/2023   Procedure: COLONOSCOPY WITH  PROPOFOL ;  Surgeon: Eartha Flavors, Toribio, MD;  Location: [facility] ENDO SUITE;  Service: Gastroenterology;  Laterality: N/A;  1:30PM;ASA 3   CORONARY ANGIOPLASTY     CORONARY ANGIOPLASTY WITH STENT PLACEMENT  2002; 2003; 11/20/2014   I have 4 stents after today (11/20/2014)   CORONARY ARTERY BYPASS GRAFT N/A 01/09/2016   Procedure: CORONARY ARTERY BYPASS GRAFTING (CABG) X 3 UTILIZING RIGHT AND LEFT INTERNAL MAMMARY ARTERY AND ENDOSCOPICALLY HARVESTED SAPHENEOUS VEIN.;  Surgeon: Sudie VEAR Laine, MD;  Location: [facility] OR;  Service: Open Heart Surgery;  Laterality: N/A;   ESOPHAGOGASTRODUODENOSCOPY     ESOPHAGOGASTRODUODENOSCOPY (EGD) WITH PROPOFOL  N/A 09/27/2020   Procedure: ESOPHAGOGASTRODUODENOSCOPY (EGD) WITH PROPOFOL ;  Surgeon: Eartha Flavors Toribio, MD;  Location: [facility];  Service: Gastroenterology;  Laterality: N/A;  10   ESOPHAGOGASTRODUODENOSCOPY (EGD) WITH PROPOFOL  N/A 06/29/2023   Procedure: ESOPHAGOGASTRODUODENOSCOPY (EGD) WITH PROPOFOL ;  Surgeon: Eartha Flavors Toribio, MD;  Location: [facility];  Service: Gastroenterology;  Laterality: N/A;  1:30PM;ASA 3   KNEE SURGERY Left 02/2012   scraped; open   LEFT HEART CATH AND CORS/GRAFTS ANGIOGRAPHY N/A 08/09/2017   Procedure: LEFT HEART CATH AND CORS/GRAFTS ANGIOGRAPHY;  Surgeon: Mady Bruckner, MD;  [facility]: [facility] INVASIVE CV LAB;  Service: Cardiovascular;  Laterality: N/A;   LEFT HEART CATH AND CORS/GRAFTS ANGIOGRAPHY N/A 11/22/2018   Procedure: LEFT HEART CATH AND CORS/GRAFTS ANGIOGRAPHY;  Surgeon: Swaziland, [name], MD;  Location: [facility] INVASIVE CV LAB;  Service: Cardiovascular;  Laterality: N/A;   LEFT HEART CATH AND CORS/GRAFTS ANGIOGRAPHY N/A 08/20/2022   Procedure: LEFT HEART CATH AND CORS/GRAFTS ANGIOGRAPHY;  Surgeon: Verlin Bruckner BIRCH, MD;  Location: [facility] INVASIVE CV LAB;  Service: Cardiovascular;  Laterality: N/A;   LEFT HEART CATHETERIZATION WITH CORONARY ANGIOGRAM N/A 07/20/2012   Procedure: LEFT HEART CATHETERIZATION WITH CORONARY ANGIOGRAM;  Surgeon: Deatrice DELENA Cage, MD;  Location: [facility] CATH LAB;  Service: Cardiovascular;  Laterality: N/A;   LEFT HEART CATHETERIZATION WITH CORONARY ANGIOGRAM N/A 11/20/2014   Procedure: LEFT HEART CATHETERIZATION WITH CORONARY ANGIOGRAM;  Surgeon: [name] Swaziland, MD;  [facility] CATH LAB;  Service: Cardiovascular;  Laterality: N/A;   LEFT HEART CATHETERIZATION WITH CORONARY ANGIOGRAM N/A 11/26/2014   Procedure: LEFT HEART CATHETERIZATION WITH CORONARY ANGIOGRAM;  Surgeon: [name] Swaziland, MD;  Location: [facility] CATH LAB;  Service: Cardiovascular;  Laterality: N/A;   NEUROPLASTY / TRANSPOSITION ULNAR NERVE AT ELBOW Right ~ 2012   PERCUTANEOUS CORONARY ROTOBLATOR INTERVENTION (PCI-R)  11/20/2014   Procedure: PERCUTANEOUS  CORONARY ROTOBLATOR INTERVENTION (PCI-R);  Surgeon: [name] Swaziland, MD;  Location: [facility] CATH LAB;  Service: Cardiovascular;;   POSTERIOR CERVICAL LAMINECTOMY Left 04/16/2022   Procedure: Laminectomy and Foraminotomy - left - C6-C7;  Surgeon: Louis Shove, MD;  Location: [facility] OR;  Service: Neurosurgery;  Laterality: Left;  3C   SHOULDER ARTHROSCOPY Left ~ 2011   TEE WITHOUT CARDIOVERSION N/A 01/09/2016   Procedure: TRANSESOPHAGEAL ECHOCARDIOGRAM (TEE);  Surgeon: Sudie VEAR Laine, MD;  Location: [facility] OR;  Service: Open Heart Surgery;  Laterality: N/A;    Family History  Problem Relation Age of Onset   Stroke Mother    Coronary artery disease Father 70   Asthma Sister    Multiple sclerosis Brother    Heart disease Paternal Uncle    Stroke Maternal Grandmother    Heart attack Paternal Grandmother    Cancer Paternal Grandfather    Asthma Sister    Seizures Son    Migraines Son    Autism Son    Migraines Son  Social History   Socioeconomic History   Marital status: Married    Spouse name: [name]   Number of children: 3   Years of education: 12 th    Highest education level: Not on file  Occupational History   Occupation: Unemployed  Tobacco Use   Smoking status: Never    Passive exposure: Current   Smokeless tobacco: Never  Vaping Use   Vaping status: Never Used  Substance and Sexual Activity   Alcohol use: No   Drug use: No   Sexual activity: Not on file  Other Topics Concern   Not on file  Social History Narrative   Patient lives at home with wife [name].   Patient works at [employer], Social research officer, government.   Patient has a 12 th grade education.    Patient has 3 children.       Updated 11/30/2023:   Works at [employer] as a Engineer, materials      Social Drivers of Corporate investment banker Strain: Low Risk  (11/30/2023)   Overall Financial Resource Strain (CARDIA)    Difficulty of Paying Living Expenses: Not hard at all  Food Insecurity: Low Risk  (05/28/2024)    Received from [facility]   Hunger Vital Sign    Within the past 12 months, you worried that your food would run out before you got money to buy more: Never true    Within the past 12 months, the food you bought just didn't last and you didn't have money to get more. : Never true  Transportation Needs: No Transportation Needs (05/28/2024)   Received from [facility]    In the past 12 months, has lack of reliable transportation kept you from medical appointments, meetings, work or from getting things needed for daily living? : No  Physical Activity: Sufficiently Active (11/30/2023)   Exercise Vital Sign    Days of Exercise per Week: 3 days    Minutes of Exercise per Session: 60 min  Stress: No Stress Concern Present (11/30/2023)   [employer] of Occupational Health - Occupational Stress Questionnaire    Feeling of Stress : Not at all  Social Connections: Socially Integrated (11/30/2023)   Social Connection and Isolation Panel    Frequency of Communication with Friends and Family: More than three times a week    Frequency of Social Gatherings with Friends and Family: More than three times a week    Attends Religious Services: More than 4 times per year    Active Member of [employer] or Organizations: Yes    Attends Banker Meetings: More than 4 times per year    Marital Status: Married  Catering manager Violence: Not At Risk (11/30/2023)   Humiliation, Afraid, Rape, and Kick questionnaire    Fear of Current or Ex-Partner: No    Emotionally Abused: No    Physically Abused: No    Sexually Abused: No    Outpatient Medications Prior to Visit  Medication Sig Dispense Refill   albuterol  (VENTOLIN  HFA) 108 (90 Base) MCG/ACT inhaler Inhale 2 puffs into the lungs every 6 (six) hours as needed for wheezing or shortness of breath.     carvedilol  (COREG ) 3.125 MG tablet Take 1 tablet (3.125 mg total) by mouth 2 (two) times daily. 60 tablet 6   citalopram  (CELEXA ) 20  MG tablet TAKE 1 TABLET BY MOUTH AT BEDTIME. 30 tablet 10   Continuous Glucose Receiver (FREESTYLE LIBRE 2 READER) DEVI 1 Device by  Does not apply route continuous. 1 each 0   Continuous Glucose Sensor (FREESTYLE LIBRE 2 SENSOR) MISC USE 1 EACH FOR 14 DAYS 2 each 11   cyclobenzaprine  (FLEXERIL ) 10 MG tablet TAKE 1 TABLET BY MOUTH 3 TIMES DAILY AS NEEDED FOR MUSCLE SPASMS 30 tablet 10   ELIQUIS  5 MG TABS tablet TAKE 1 TABLET BY MOUTH TWICE DAILY 60 tablet 10   ezetimibe  (ZETIA ) 10 MG tablet TAKE 1 TABLET BY MOUTH ONCE DAILY 30 tablet 11   FARXIGA  5 MG TABS tablet TAKE 1 TABLET BY MOUTH DAILY BEFORE BREAKFAST 30 tablet 11   fluticasone  (FLONASE ) 50 MCG/ACT nasal spray PLACE 2 SPRAYS INTO BOTH NOSTRILS 2 TIMES DAILY. 16 g 11   fluticasone -salmeterol (ADVAIR ) 250-50 MCG/ACT AEPB INHALE 1 PUFF BY MOUTH TWICE DAILY 60 each 11   gabapentin  (NEURONTIN ) 300 MG capsule TAKE 1 CAPSULE BY MOUTH THREE TIMES DAILY 90 capsule 11   isosorbide  mononitrate (IMDUR ) 60 MG 24 hr tablet Take 0.5 tablets (30 mg total) by mouth 2 (two) times daily. 30 tablet 10   Melatonin 10 MG TABS Take 10 mg by mouth at bedtime.     naproxen  (NAPROSYN ) 500 MG tablet Take 500 mg by mouth daily as needed.     nitroGLYCERIN  (NITROSTAT ) 0.4 MG SL tablet DISSOLVE 1 TABLET UNDER THE TONGUE AS NEEDED FOR CHEST PAIN EVERY 5 MINUTES UP TO 3 TIMES. IF NO RELIEF CALL 911. 25 tablet 11   omeprazole  (PRILOSEC) 40 MG capsule TAKE 1 CAPSULE BY MOUTH DAILY. 30 capsule 11   pravastatin  (PRAVACHOL ) 20 MG tablet TAKE 1 TABLET BY MOUTH DAILY 30 tablet 11   topiramate  (TOPAMAX ) 50 MG tablet TAKE 1 TABLET BY MOUTH TWICE DAILY 180 tablet 11   zonisamide  (ZONEGRAN ) 100 MG capsule Take 100 mg by mouth daily.     methocarbamol  (ROBAXIN ) 500 MG tablet Take 500 mg by mouth daily.     tirzepatide  ([name] ) 7.5 MG/0.5ML Pen Inject 7.5 mg into the skin.     No facility-administered medications prior to visit.    Allergies  Allergen Reactions   [name]   [Tirzepatide ] Nausea And Vomiting    Hopsitalized in 05/2024 with gastric emphysema , thought to be caused by recently started mounjaro    Divalproex Sodium Other (See Comments)    Causes anger   Statins Other (See Comments)    Muscle aches and cramps   Tramadol  Other (See Comments)    Chest pain    Valproic Acid Other (See Comments)    Causes anger   Gadolinium Derivatives Nausea And Vomiting    07/25/19 Pt vomited immediately after IV gad. Denies itching, dyspnea.  (Adverse, not allergic, reaction   Tricor  [Fenofibrate ] Other (See Comments)    Leg cramps    ROS Review of Systems  Constitutional:  Negative for chills and fever.  HENT:  Negative for congestion and sore throat.   Eyes:  Negative for pain and discharge.  Respiratory:  Negative for cough and shortness of breath.   Cardiovascular:  Negative for chest pain and palpitations.  Gastrointestinal:  Negative for diarrhea, nausea and vomiting.  Endocrine: Negative for polydipsia and polyuria.  Genitourinary:  Negative for dysuria and hematuria.  Musculoskeletal:  Negative for neck pain and neck stiffness.  Skin:  Negative for rash.  Neurological:  Negative for dizziness and weakness.  Psychiatric/Behavioral:  Negative for agitation and behavioral problems.       Objective:    Physical Exam Vitals reviewed.  Constitutional:  General: He is not in acute distress.    Appearance: He is not diaphoretic.  HENT:     Head: Normocephalic and atraumatic.     Nose: Nose normal.     Mouth/Throat:     Mouth: Mucous membranes are moist.  Eyes:     General: No scleral icterus.    Extraocular Movements: Extraocular movements intact.  Cardiovascular:     Rate and Rhythm: Normal rate and regular rhythm.     Heart sounds: Normal heart sounds. No murmur heard. Pulmonary:     Breath sounds: Normal breath sounds. No wheezing or rales.  Musculoskeletal:     Cervical back: Neck supple. No tenderness.     Right lower leg: No  edema.     Left lower leg: No edema.  Skin:    General: Skin is warm.     Findings: No rash.  Neurological:     General: No focal deficit present.     Mental Status: He is alert and oriented to person, place, and time.     Cranial Nerves: No cranial nerve deficit.     Sensory: No sensory deficit.     Motor: No weakness.  Psychiatric:        Mood and Affect: Mood normal.        Behavior: Behavior normal.     BP (!) 100/58   Pulse 74   Ht 5' 9 (1.753 m)   Wt 200 lb 14.4 oz (91.1 kg)   SpO2 94%   BMI 29.67 kg/m  Wt Readings from Last 3 Encounters:  07/26/24 200 lb 14.4 oz (91.1 kg)  07/06/24 197 lb 12.8 oz (89.7 kg)  06/21/24 197 lb 15.6 oz (89.8 kg)    Lab Results  Component Value Date   TSH 2.380 02/15/2024   Lab Results  Component Value Date   WBC 2.9 (L) 06/21/2024   HGB 15.6 06/21/2024   HCT 45.0 06/21/2024   MCV 93.6 06/21/2024   PLT 69 (L) 06/21/2024   Lab Results  Component Value Date   NA 137 06/21/2024   K 3.8 06/21/2024   CO2 24 06/21/2024   GLUCOSE 141 (H) 06/21/2024   BUN 15 06/21/2024   CREATININE 1.22 06/21/2024   BILITOT 0.7 06/21/2024   ALKPHOS 46 06/21/2024   AST 31 06/21/2024   ALT 26 06/21/2024   PROT 7.5 06/21/2024   ALBUMIN  3.9 06/21/2024   CALCIUM  9.4 06/21/2024   ANIONGAP 9 06/21/2024   EGFR 75 04/07/2024   Lab Results  Component Value Date   CHOL 154 02/15/2024   Lab Results  Component Value Date   HDL 37 (L) 02/15/2024   Lab Results  Component Value Date   LDLCALC 91 02/15/2024   Lab Results  Component Value Date   TRIG 145 02/15/2024   Lab Results  Component Value Date   CHOLHDL 4.2 02/15/2024   Lab Results  Component Value Date   HGBA1C 7.6 (H) 02/15/2024      Assessment & Plan:   Problem List Items Addressed This Visit       Cardiovascular and Mediastinum   Essential hypertension, benign   BP Readings from Last 1 Encounters:  07/26/24 (!) 100/58   Well-controlled with carvedilol  6.25 mg twice  daily and Imdur  Advised to take half tablet instead of whole tablet of Imdur  twice daily to reduce incidence of morning headaches Counseled for compliance with the medications Advised DASH diet and moderate exercise/walking as tolerated      Reversible  cerebrovascular vasoconstriction syndrome - Primary   Recent hospitalization at [facility] revealed reversible cerebrovascular vasoconstriction syndrome changes on MRI brain Had uncontrolled BP at that time, but currently has well-controlled HTN Would continue carvedilol  for now considering history of CAD Advised to maintain adequate hydration and focus on proper rest Follow up with neurology at [facility]  Has completed PT Return to work note provided        Endocrine   Type 2 diabetes mellitus with other specified complication (HCC)   Lab Results  Component Value Date   HGBA1C 7.6 (H) 02/15/2024   Uncontrolled Associated with HTN, HLD and CVA Has stopped taking [name]  due to GI side effects, would prefer to restart it or Ozempic after GI evaluation On Farxiga  5 mg QD Started metformin  500 mg twice daily for now Advised to follow diabetic diet On statin      Relevant Medications   metFORMIN  (GLUCOPHAGE -XR) 500 MG 24 hr tablet     Other   History of CVA (cerebrovascular accident)   Currently on Eliquis , statin and Zetia  Would be unusual to have new ischemic CVA while on Eliquis  Awaiting evaluation by neurology at [facility]      Return to work evaluation   Works as a Electrical engineer at [employer] Has recently completed PT after RCVS/CVA Return to work note provided       Meds ordered this encounter  Medications   metFORMIN  (GLUCOPHAGE -XR) 500 MG 24 hr tablet    Sig: Take 1 tablet (500 mg total) by mouth 2 (two) times daily with a meal.    Dispense:  180 tablet    Refill:  1    Follow-up: Return if symptoms worsen or fail to improve.    Suzzane MARLA Blanch, MD

## 2024-07-26 NOTE — Assessment & Plan Note (Signed)
 BP Readings from Last 1 Encounters:  07/26/24 (!) 100/58   Well-controlled with carvedilol  6.25 mg twice daily and Imdur  Advised to take half tablet instead of whole tablet of Imdur  twice daily to reduce incidence of morning headaches Counseled for compliance with the medications Advised DASH diet and moderate exercise/walking as tolerated

## 2024-07-26 NOTE — Assessment & Plan Note (Signed)
 Currently on Eliquis , statin and Zetia  Would be unusual to have new ischemic CVA while on Eliquis  Awaiting evaluation by neurology at [facility]

## 2024-07-26 NOTE — Patient Instructions (Signed)
 Please start taking Metformin  500 mg twice daily.  Please continue to take medications as prescribed.  Please continue to follow low carb diet and perform moderate exercise/walking at least 150 mins/week.

## 2024-07-28 ENCOUNTER — Other Ambulatory Visit: Payer: Self-pay | Admitting: Family Medicine

## 2024-07-28 DIAGNOSIS — E1169 Type 2 diabetes mellitus with other specified complication: Secondary | ICD-10-CM

## 2024-07-28 MED ORDER — GLIPIZIDE 5 MG PO TABS: 5.0000 mg | ORAL_TABLET | Freq: Two times a day (BID) | ORAL | 3 refills | Status: AC

## 2024-07-28 NOTE — Telephone Encounter (Signed)
 Message sent to patient

## 2024-07-31 ENCOUNTER — Telehealth: Payer: Self-pay

## 2024-07-31 NOTE — Telephone Encounter (Signed)
 Copied from CRM [id number]. Topic: Clinical - Medical Advice >> Jul 31, 2024  9:34 AM Anairis L wrote: Reason for CRM: Continuous Glucose Sensor (FREESTYLE LIBRE 2 SENSOR) MISC is being discontinued 08/08/2024 and a new script should be sent in requesting Continuous Glucose Sensor libre 2 plus.  [facility] [place]  [place], [place] - [address] [place] [address] Phone: [phone] Fax: [phone]

## 2024-08-03 ENCOUNTER — Inpatient Hospital Stay

## 2024-08-03 ENCOUNTER — Inpatient Hospital Stay: Admitting: Oncology

## 2024-08-04 DIAGNOSIS — K7460 Unspecified cirrhosis of liver: Secondary | ICD-10-CM | POA: Diagnosis not present

## 2024-08-04 DIAGNOSIS — I5032 Chronic diastolic (congestive) heart failure: Secondary | ICD-10-CM | POA: Diagnosis not present

## 2024-08-04 DIAGNOSIS — M5430 Sciatica, unspecified side: Secondary | ICD-10-CM | POA: Diagnosis not present

## 2024-08-04 DIAGNOSIS — I69892 Facial weakness following other cerebrovascular disease: Secondary | ICD-10-CM | POA: Diagnosis not present

## 2024-08-04 DIAGNOSIS — I110 Hypertensive heart disease with heart failure: Secondary | ICD-10-CM | POA: Diagnosis not present

## 2024-08-04 DIAGNOSIS — K219 Gastro-esophageal reflux disease without esophagitis: Secondary | ICD-10-CM | POA: Diagnosis not present

## 2024-08-04 DIAGNOSIS — I67841 Reversible cerebrovascular vasoconstriction syndrome: Secondary | ICD-10-CM | POA: Diagnosis not present

## 2024-08-04 DIAGNOSIS — Z951 Presence of aortocoronary bypass graft: Secondary | ICD-10-CM | POA: Diagnosis not present

## 2024-08-04 DIAGNOSIS — I252 Old myocardial infarction: Secondary | ICD-10-CM | POA: Diagnosis not present

## 2024-08-04 DIAGNOSIS — G43909 Migraine, unspecified, not intractable, without status migrainosus: Secondary | ICD-10-CM | POA: Diagnosis not present

## 2024-08-04 DIAGNOSIS — Z7901 Long term (current) use of anticoagulants: Secondary | ICD-10-CM | POA: Diagnosis not present

## 2024-08-04 DIAGNOSIS — J45909 Unspecified asthma, uncomplicated: Secondary | ICD-10-CM | POA: Diagnosis not present

## 2024-08-04 DIAGNOSIS — Z7984 Long term (current) use of oral hypoglycemic drugs: Secondary | ICD-10-CM | POA: Diagnosis not present

## 2024-08-04 DIAGNOSIS — K3189 Other diseases of stomach and duodenum: Secondary | ICD-10-CM | POA: Diagnosis not present

## 2024-08-04 DIAGNOSIS — Z86711 Personal history of pulmonary embolism: Secondary | ICD-10-CM | POA: Diagnosis not present

## 2024-08-04 DIAGNOSIS — D696 Thrombocytopenia, unspecified: Secondary | ICD-10-CM | POA: Diagnosis not present

## 2024-08-04 DIAGNOSIS — E119 Type 2 diabetes mellitus without complications: Secondary | ICD-10-CM | POA: Diagnosis not present

## 2024-08-04 DIAGNOSIS — E785 Hyperlipidemia, unspecified: Secondary | ICD-10-CM | POA: Diagnosis not present

## 2024-08-04 DIAGNOSIS — I2510 Atherosclerotic heart disease of native coronary artery without angina pectoris: Secondary | ICD-10-CM | POA: Diagnosis not present

## 2024-08-04 DIAGNOSIS — K7581 Nonalcoholic steatohepatitis (NASH): Secondary | ICD-10-CM | POA: Diagnosis not present

## 2024-08-10 ENCOUNTER — Ambulatory Visit: Admitting: Family Medicine

## 2024-08-10 ENCOUNTER — Ambulatory Visit (INDEPENDENT_AMBULATORY_CARE_PROVIDER_SITE_OTHER): Admitting: Gastroenterology

## 2024-08-15 ENCOUNTER — Other Ambulatory Visit: Payer: Self-pay | Admitting: Internal Medicine

## 2024-08-15 DIAGNOSIS — I25118 Atherosclerotic heart disease of native coronary artery with other forms of angina pectoris: Secondary | ICD-10-CM

## 2024-08-23 ENCOUNTER — Encounter (INDEPENDENT_AMBULATORY_CARE_PROVIDER_SITE_OTHER): Payer: Self-pay | Admitting: Gastroenterology

## 2024-09-14 ENCOUNTER — Telehealth: Payer: Self-pay

## 2024-09-14 NOTE — Telephone Encounter (Signed)
 Copied from CRM [id number]. Topic: Clinical - Medical Advice >> Sep 14, 2024 10:56 AM Gustabo D wrote: Lyn with [facility] needing proof pt has a chronic disease to get his plan. Call back [phone] Opt 1

## 2024-09-14 NOTE — Telephone Encounter (Signed)
 Option 1 does not give you the opportunity to speak to anyone, must have extension to get someone on the phone.

## 2024-10-09 ENCOUNTER — Telehealth: Payer: Self-pay | Admitting: Family Medicine

## 2024-10-09 DIAGNOSIS — E1169 Type 2 diabetes mellitus with other specified complication: Secondary | ICD-10-CM

## 2024-10-09 NOTE — Telephone Encounter (Unsigned)
 Copied from CRM [id number]. Topic: Clinical - Medication Refill >> Oct 09, 2024  3:36 PM Rachelle R wrote: Medication: metFORMIN  (GLUCOPHAGE -XR) 500 MG 24 hr tablet [facility] are asking for a 100 day supply)  Has the patient contacted their pharmacy? Yes, call dr  This is the patient's preferred pharmacy:  [facility] [place]  [place], [place] - [address] [place] [address] Phone: [phone] Fax: [phone]  Is this the correct pharmacy for this prescription? Yes If no, delete pharmacy and type the correct one.   Has the prescription been filled recently? No  Is the patient out of the medication? Yes  Has the patient been seen for an appointment in the last year OR does the patient have an upcoming appointment? Yes  Can we respond through MyChart? Yes  Agent: Please be advised that Rx refills may take up to 3 business days. We ask that you follow-up with your pharmacy.

## 2024-10-10 MED ORDER — METFORMIN HCL ER 500 MG PO TB24: 500.0000 mg | ORAL_TABLET | Freq: Two times a day (BID) | ORAL | 1 refills | Status: AC

## 2024-10-24 ENCOUNTER — Encounter (HOSPITAL_COMMUNITY): Payer: Self-pay | Admitting: Oncology

## 2024-10-24 NOTE — Assessment & Plan Note (Signed)
-  On anticoagulation indefinitely. -Continue Eliquis .

## 2024-11-03 ENCOUNTER — Emergency Department (HOSPITAL_COMMUNITY)

## 2024-11-03 ENCOUNTER — Emergency Department (HOSPITAL_COMMUNITY)
Admission: EM | Admit: 2024-11-03 | Discharge: 2024-11-03 | Disposition: A | Discharge status: Home / Self Care | Attending: Emergency Medicine | Admitting: Emergency Medicine

## 2024-11-03 ENCOUNTER — Encounter (HOSPITAL_COMMUNITY): Payer: Self-pay

## 2024-11-03 ENCOUNTER — Other Ambulatory Visit: Payer: Self-pay

## 2024-11-03 ENCOUNTER — Encounter (HOSPITAL_COMMUNITY): Payer: Self-pay | Admitting: Oncology

## 2024-11-03 DIAGNOSIS — S0081XA Abrasion of other part of head, initial encounter: Secondary | ICD-10-CM | POA: Diagnosis present

## 2024-11-03 DIAGNOSIS — J45909 Unspecified asthma, uncomplicated: Secondary | ICD-10-CM | POA: Diagnosis not present

## 2024-11-03 DIAGNOSIS — W01198A Fall on same level from slipping, tripping and stumbling with subsequent striking against other object, initial encounter: Secondary | ICD-10-CM | POA: Insufficient documentation

## 2024-11-03 DIAGNOSIS — Z79899 Other long term (current) drug therapy: Secondary | ICD-10-CM | POA: Insufficient documentation

## 2024-11-03 DIAGNOSIS — W19XXXA Unspecified fall, initial encounter: Secondary | ICD-10-CM

## 2024-11-03 DIAGNOSIS — Z7984 Long term (current) use of oral hypoglycemic drugs: Secondary | ICD-10-CM | POA: Diagnosis not present

## 2024-11-03 DIAGNOSIS — S50311A Abrasion of right elbow, initial encounter: Secondary | ICD-10-CM | POA: Diagnosis not present

## 2024-11-03 DIAGNOSIS — E119 Type 2 diabetes mellitus without complications: Secondary | ICD-10-CM | POA: Diagnosis not present

## 2024-11-03 DIAGNOSIS — I10 Essential (primary) hypertension: Secondary | ICD-10-CM | POA: Diagnosis not present

## 2024-11-03 DIAGNOSIS — Z7901 Long term (current) use of anticoagulants: Secondary | ICD-10-CM | POA: Insufficient documentation

## 2024-11-03 DIAGNOSIS — S0990XA Unspecified injury of head, initial encounter: Secondary | ICD-10-CM

## 2024-11-03 NOTE — ED Triage Notes (Signed)
 Pt reports chasing after the 68 year old and fell and hit his head. Pt takes Eliquis . Pt denies LOC.

## 2024-11-03 NOTE — Discharge Instructions (Signed)
 Your CAT scan did not show any bleeding.  Follow-up with your doctor as needed.  Return for worsening symptoms.

## 2024-11-03 NOTE — ED Provider Notes (Signed)
 " [place] [facility] AT [facility] Provider Note   CSN: [id number] Arrival date & time: 11/03/24  1529     Patient presents with: [name] [name] is a 68 y.o. male.    Fall  Patient presents after fall.  Is on Eliquis .  History is significant 105-year-old granddaughter and fell striking his head.  No loss conscious.  Mild pain to right arm.  No other injury.  States last tetanus is less than 20    Past Medical History:  Diagnosis Date   Anxiety    Aortic insufficiency    Arthritis    Asthma    Cataract    Right eye   Chronic lower back pain    Cirrhosis of liver (HCC)    Coronary atherosclerosis of native coronary artery    a. 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 Authorizing Provider  albuterol  (VENTOLIN  HFA) 108 (90 Base) MCG/ACT inhaler Inhale 2 puffs into the lungs every 6 (six) hours as needed for wheezing or shortness of breath.    [provider]  carvedilol  (COREG ) 3.125 MG tablet Take 1 tablet (3.125 mg total) by mouth 2 (two) times daily. 07/06/24   Debera Jayson MATSU, MD  citalopram  (CELEXA ) 20 MG tablet TAKE 1 TABLET BY MOUTH AT BEDTIME 08/16/24   Bacchus, Gloria Z, FNP  Continuous Glucose Receiver (FREESTYLE LIBRE 2 READER) DEVI 1 Device by Does not apply route continuous. 12/28/23   Melvenia Manus BRAVO, MD  Continuous Glucose Sensor (FREESTYLE LIBRE 2 SENSOR) MISC USE 1 EACH FOR 14 DAYS 07/11/24   Bacchus, Gloria Z, FNP  cyclobenzaprine  (FLEXERIL ) 10 MG tablet TAKE 1 TABLET BY MOUTH 3 TIMES DAILY AS NEEDED FOR MUSCLE SPASMS 12/21/23   Melvenia Manus BRAVO, MD  ELIQUIS  5 MG TABS tablet TAKE 1 TABLET BY MOUTH TWICE DAILY 01/19/24   Rogers Hai, MD  ezetimibe  (ZETIA ) 10 MG tablet TAKE 1 TABLET BY MOUTH ONCE DAILY 05/18/24   Bacchus, Gloria Z, FNP  FARXIGA  5 MG TABS tablet TAKE 1 TABLET BY MOUTH DAILY BEFORE BREAKFAST 07/19/24   Bacchus, Gloria Z, FNP  fluticasone  (FLONASE ) 50 MCG/ACT nasal spray PLACE 2 SPRAYS INTO BOTH NOSTRILS 2 TIMES DAILY. 02/17/24   Melvenia Manus BRAVO, MD  fluticasone -salmeterol (ADVAIR ) 250-50 MCG/ACT AEPB INHALE 1 PUFF BY MOUTH TWICE DAILY 05/18/24   Bacchus, Gloria Z, FNP  gabapentin  (NEURONTIN ) 300 MG capsule TAKE 1 CAPSULE BY MOUTH THREE TIMES DAILY 04/18/24   Dixon, Phillip E, MD  glipiZIDE  (GLUCOTROL ) 5 MG tablet Take 1 tablet (5 mg total) by mouth 2 (two) times daily before a meal. 07/28/24   Bacchus, Meade PEDLAR, FNP  isosorbide   mononitrate (IMDUR ) 60 MG 24 hr tablet TAKE 1 TABLET BY MOUTH IN THE MORNING AND 1/2 TABLET IN THE EVENING 08/16/24   Bacchus, Meade PEDLAR, FNP  Melatonin 10 MG TABS Take 10 mg by mouth at bedtime.    [provider]  metFORMIN  (GLUCOPHAGE -XR) 500 MG 24 hr tablet Take 1 tablet (500 mg total) by mouth 2 (two) times daily with a meal. 10/10/24   Bacchus, Meade PEDLAR, FNP  nitroGLYCERIN  (NITROSTAT ) 0.4 MG SL tablet DISSOLVE 1 TABLET UNDER THE TONGUE AS NEEDED FOR CHEST PAIN EVERY 5 MINUTES UP TO 3 TIMES. IF NO RELIEF CALL 911. 04/18/24   Melvenia Manus BRAVO, MD  omeprazole  (PRILOSEC) 40 MG capsule TAKE 1 CAPSULE BY MOUTH DAILY. 05/18/24   Bacchus, Gloria Z, FNP  pravastatin  (PRAVACHOL ) 20 MG tablet TAKE 1 TABLET BY MOUTH DAILY 07/18/24   Bacchus, Gloria Z, FNP  topiramate  (TOPAMAX ) 50 MG tablet TAKE 1 TABLET BY MOUTH TWICE DAILY 02/21/24   Dixon, Phillip  E, MD  zonisamide  (ZONEGRAN ) 100 MG capsule Take 100 mg by mouth daily. 01/19/23   [provider]    Allergies: [name]  [tirzepatide ], Divalproex sodium, Statins, Tramadol , Valproic acid, Gadolinium derivatives, and Tricor  [fenofibrate ]    Review of Systems  Updated Vital Signs BP 126/65 (BP Location: Right Arm)   Pulse 68   Temp 97.8 F (36.6 C) (Oral)   Resp 18   Ht 5' 9 (1.753 m)   Wt 90.7 kg   SpO2 99%   BMI 29.53 kg/m   Physical Exam Vitals reviewed.  HENT:     Head:     Comments: Mild abrasion right temple area. Eyes:     Pupils: Pupils are equal, round, and reactive to light.  Pulmonary:     Breath sounds: No wheezing.  Chest:     Chest wall: No tenderness.  Abdominal:     Tenderness: There is no abdominal tenderness.  Musculoskeletal:     Cervical back: Neck supple. No tenderness.     Comments: Slight abrasion to right elbow without underlying bony tenderness.  Good range of motion.  Good range of motion shoulder.  Neurological:     Mental Status: Mental status is at baseline.     (all labs ordered are listed, but only abnormal results are displayed) Labs Reviewed - No data to display  EKG: None  Radiology: CT HEAD WO CONTRAST Result Date: 11/03/2024 EXAM: CT HEAD WITHOUT CONTRAST 11/03/2024 03:48:26 PM TECHNIQUE: CT of the head was performed without the administration of intravenous contrast. Automated exposure control, iterative reconstruction, and/or weight based adjustment of the mA/kV was utilized to reduce the radiation dose to as low as reasonably achievable. COMPARISON: MRI head and CT head 03/31/2024. CLINICAL HISTORY: Facial trauma, blunt; Pt takes Eliquis . FINDINGS: BRAIN AND VENTRICLES: No acute hemorrhage. No evidence of acute infarct. Remote infarct involving the left periventricular white matter and left caudate nucleus. No hydrocephalus. No extra-axial collection. No mass effect or midline shift. ORBITS: Bilateral lens replacement.  No acute abnormality. SINUSES: No acute abnormality. SOFT TISSUES AND SKULL: No acute soft tissue abnormality. No skull fracture. VASCULATURE: Atherosclerosis of the carotid siphons. IMPRESSION: 1. No acute intracranial abnormality. Electronically signed by: Donnice Mania MD 11/03/2024 03:55 PM EST RP Workstation: [id number]     Procedures   Medications Ordered in the ED - No data to display  Medical Decision Making Amount and/or Complexity of Data Reviewed Radiology: ordered.   Patient with fall.  Hit head.  On anticoagulation but negative head CT.  Workup reassuring.  Do not think further workup at this time.  Tetanus up-to-date.  Discharged home.     Final diagnoses:  Fall, initial encounter  Minor head injury, initial encounter    ED Discharge Orders     None          Patsey Lot, MD 11/03/24 1844  "

## 2024-11-17 ENCOUNTER — Other Ambulatory Visit: Payer: Self-pay | Admitting: Internal Medicine

## 2024-11-17 ENCOUNTER — Other Ambulatory Visit: Payer: Self-pay | Admitting: Family Medicine

## 2024-12-04 ENCOUNTER — Ambulatory Visit: Payer: 59

## 2024-12-15 ENCOUNTER — Other Ambulatory Visit: Payer: Self-pay | Admitting: Cardiology

## 2024-12-28 ENCOUNTER — Inpatient Hospital Stay

## 2025-01-03 ENCOUNTER — Inpatient Hospital Stay: Admitting: Oncology

## 2025-01-04 ENCOUNTER — Inpatient Hospital Stay: Admitting: Oncology

## 2025-01-04 ENCOUNTER — Ambulatory Visit: Admitting: Oncology

## 2025-01-19 ENCOUNTER — Ambulatory Visit
